# Patient Record
Sex: Female | Born: 1945
Health system: Southern US, Community
[De-identification: ages and names within clinical notes are randomized; demographics above are authoritative.]

## PROBLEM LIST (undated history)

## (undated) DIAGNOSIS — K5790 Diverticulosis of intestine, part unspecified, without perforation or abscess without bleeding: Secondary | ICD-10-CM

## (undated) DIAGNOSIS — M5030 Other cervical disc degeneration, unspecified cervical region: Secondary | ICD-10-CM

## (undated) DIAGNOSIS — N951 Menopausal and female climacteric states: Secondary | ICD-10-CM

## (undated) DIAGNOSIS — I10 Essential (primary) hypertension: Secondary | ICD-10-CM

## (undated) DIAGNOSIS — E669 Obesity, unspecified: Secondary | ICD-10-CM

## (undated) DIAGNOSIS — G4733 Obstructive sleep apnea (adult) (pediatric): Secondary | ICD-10-CM

## (undated) DIAGNOSIS — L304 Erythema intertrigo: Secondary | ICD-10-CM

## (undated) DIAGNOSIS — E119 Type 2 diabetes mellitus without complications: Secondary | ICD-10-CM

## (undated) DIAGNOSIS — N189 Chronic kidney disease, unspecified: Secondary | ICD-10-CM

## (undated) DIAGNOSIS — M47812 Spondylosis without myelopathy or radiculopathy, cervical region: Secondary | ICD-10-CM

## (undated) DIAGNOSIS — I8390 Asymptomatic varicose veins of unspecified lower extremity: Secondary | ICD-10-CM

## (undated) DIAGNOSIS — E785 Hyperlipidemia, unspecified: Secondary | ICD-10-CM

## (undated) DIAGNOSIS — R55 Syncope and collapse: Secondary | ICD-10-CM

## (undated) DIAGNOSIS — R252 Cramp and spasm: Secondary | ICD-10-CM

## (undated) HISTORY — DX: Hyperlipidemia, unspecified: E78.5

## (undated) HISTORY — DX: Chronic kidney disease, unspecified: N18.9

## (undated) HISTORY — DX: Asymptomatic varicose veins of unspecified lower extremity: I83.90

## (undated) HISTORY — DX: Essential (primary) hypertension: I10

## (undated) HISTORY — DX: Spondylosis without myelopathy or radiculopathy, cervical region: M47.812

## (undated) HISTORY — PX: JOINT REPLACEMENT: SHX530

## (undated) HISTORY — DX: Menopausal and female climacteric states: N95.1

## (undated) HISTORY — DX: Obesity, unspecified: E66.9

## (undated) HISTORY — DX: Type 2 diabetes mellitus without complications: E11.9

## (undated) HISTORY — DX: Other cervical disc degeneration, unspecified cervical region: M50.30

## (undated) HISTORY — DX: Cramp and spasm: R25.2

## (undated) HISTORY — DX: Obstructive sleep apnea (adult) (pediatric): G47.33

## (undated) HISTORY — DX: Erythema intertrigo: L30.4

---

## 1991-09-16 HISTORY — PX: ABDOMINAL HYSTERECTOMY: SHX81

## 1991-09-16 HISTORY — PX: BUNIONECTOMY: SHX129

## 2005-06-03 ENCOUNTER — Ambulatory Visit: Payer: Self-pay

## 2006-03-19 ENCOUNTER — Ambulatory Visit: Payer: Self-pay | Admitting: Family Medicine

## 2006-04-16 ENCOUNTER — Ambulatory Visit: Payer: Self-pay | Admitting: Family Medicine

## 2006-06-09 ENCOUNTER — Ambulatory Visit: Payer: Self-pay | Admitting: Family Medicine

## 2006-12-26 ENCOUNTER — Emergency Department: Payer: Self-pay | Admitting: Internal Medicine

## 2007-06-11 ENCOUNTER — Ambulatory Visit: Payer: Self-pay | Admitting: Family Medicine

## 2007-07-02 ENCOUNTER — Emergency Department: Payer: Self-pay | Admitting: Emergency Medicine

## 2007-07-06 ENCOUNTER — Emergency Department: Payer: Self-pay | Admitting: Emergency Medicine

## 2007-11-10 ENCOUNTER — Ambulatory Visit: Payer: Self-pay | Admitting: Family Medicine

## 2008-04-21 ENCOUNTER — Ambulatory Visit: Payer: Self-pay | Admitting: Family Medicine

## 2008-04-26 ENCOUNTER — Ambulatory Visit: Payer: Self-pay | Admitting: Family Medicine

## 2008-06-29 ENCOUNTER — Encounter: Admission: RE | Admit: 2008-06-29 | Discharge: 2008-06-29 | Payer: Self-pay | Admitting: Neurosurgery

## 2008-07-28 ENCOUNTER — Ambulatory Visit (HOSPITAL_COMMUNITY): Admission: RE | Admit: 2008-07-28 | Discharge: 2008-07-28 | Payer: Self-pay | Admitting: Neurosurgery

## 2008-08-31 ENCOUNTER — Ambulatory Visit: Payer: Self-pay | Admitting: Family Medicine

## 2008-09-15 LAB — HM DEXA SCAN: HM DEXA SCAN: NORMAL

## 2009-04-13 ENCOUNTER — Emergency Department: Payer: Self-pay | Admitting: Emergency Medicine

## 2009-10-04 ENCOUNTER — Ambulatory Visit: Payer: Self-pay | Admitting: Family Medicine

## 2009-11-17 ENCOUNTER — Emergency Department: Payer: Self-pay | Admitting: Emergency Medicine

## 2010-03-15 HISTORY — PX: REPLACEMENT TOTAL KNEE: SUR1224

## 2010-03-25 ENCOUNTER — Ambulatory Visit: Payer: Self-pay | Admitting: Orthopedic Surgery

## 2010-03-28 ENCOUNTER — Ambulatory Visit: Payer: Self-pay | Admitting: Family Medicine

## 2010-04-02 ENCOUNTER — Inpatient Hospital Stay: Payer: Self-pay | Admitting: Orthopedic Surgery

## 2010-10-08 ENCOUNTER — Ambulatory Visit: Payer: Self-pay | Admitting: Family Medicine

## 2011-02-28 LAB — HM PAP SMEAR: HM Pap smear: NORMAL

## 2011-05-23 ENCOUNTER — Ambulatory Visit: Payer: Self-pay | Admitting: Gastroenterology

## 2011-05-23 LAB — HM COLONOSCOPY

## 2011-08-12 ENCOUNTER — Other Ambulatory Visit: Payer: Self-pay | Admitting: Nurse Practitioner

## 2011-12-30 ENCOUNTER — Ambulatory Visit: Payer: Self-pay | Admitting: Family Medicine

## 2012-05-20 ENCOUNTER — Emergency Department: Payer: Self-pay | Admitting: Emergency Medicine

## 2012-05-22 ENCOUNTER — Emergency Department: Payer: Self-pay | Admitting: Emergency Medicine

## 2012-05-22 LAB — CBC
HCT: 40 % (ref 35.0–47.0)
HGB: 13.4 g/dL (ref 12.0–16.0)
MCV: 96 fL (ref 80–100)
Platelet: 187 10*3/uL (ref 150–440)
RBC: 4.16 10*6/uL (ref 3.80–5.20)
WBC: 8.6 10*3/uL (ref 3.6–11.0)

## 2012-05-22 LAB — COMPREHENSIVE METABOLIC PANEL
Albumin: 3.4 g/dL (ref 3.4–5.0)
Alkaline Phosphatase: 115 U/L (ref 50–136)
Bilirubin,Total: 0.3 mg/dL (ref 0.2–1.0)
Co2: 32 mmol/L (ref 21–32)
Creatinine: 1.63 mg/dL — ABNORMAL HIGH (ref 0.60–1.30)
Glucose: 87 mg/dL (ref 65–99)
Osmolality: 280 (ref 275–301)
Potassium: 3.3 mmol/L — ABNORMAL LOW (ref 3.5–5.1)
Sodium: 139 mmol/L (ref 136–145)

## 2012-06-18 ENCOUNTER — Ambulatory Visit: Payer: Self-pay | Admitting: Internal Medicine

## 2012-06-30 ENCOUNTER — Ambulatory Visit: Payer: Self-pay | Admitting: Internal Medicine

## 2012-07-29 HISTORY — PX: LASER ABLATION: SHX1947

## 2012-08-16 ENCOUNTER — Ambulatory Visit: Payer: Self-pay | Admitting: Family Medicine

## 2012-09-15 ENCOUNTER — Ambulatory Visit: Payer: Self-pay | Admitting: Family Medicine

## 2012-10-16 ENCOUNTER — Ambulatory Visit: Payer: Self-pay | Admitting: Family Medicine

## 2012-10-18 ENCOUNTER — Emergency Department: Payer: Self-pay | Admitting: Emergency Medicine

## 2012-10-18 LAB — BASIC METABOLIC PANEL
Anion Gap: 7 (ref 7–16)
Chloride: 110 mmol/L — ABNORMAL HIGH (ref 98–107)
Co2: 24 mmol/L (ref 21–32)
Creatinine: 1.71 mg/dL — ABNORMAL HIGH (ref 0.60–1.30)
Osmolality: 289 (ref 275–301)
Sodium: 141 mmol/L (ref 136–145)

## 2012-10-18 LAB — CBC
HCT: 32.1 % — ABNORMAL LOW (ref 35.0–47.0)
Platelet: 174 10*3/uL (ref 150–440)
RBC: 3.35 10*6/uL — ABNORMAL LOW (ref 3.80–5.20)

## 2012-10-18 LAB — PROTIME-INR
INR: 1
Prothrombin Time: 13.8 secs (ref 11.5–14.7)

## 2012-11-13 ENCOUNTER — Ambulatory Visit: Payer: Self-pay | Admitting: Family Medicine

## 2013-01-11 ENCOUNTER — Ambulatory Visit: Payer: Self-pay | Admitting: Family Medicine

## 2013-06-17 ENCOUNTER — Ambulatory Visit: Payer: Self-pay | Admitting: Orthopedic Surgery

## 2013-09-02 ENCOUNTER — Ambulatory Visit: Payer: Self-pay | Admitting: Physical Medicine and Rehabilitation

## 2013-11-07 DIAGNOSIS — N3281 Overactive bladder: Secondary | ICD-10-CM | POA: Insufficient documentation

## 2013-11-14 DIAGNOSIS — Z9889 Other specified postprocedural states: Secondary | ICD-10-CM | POA: Insufficient documentation

## 2013-11-14 HISTORY — PX: LAMINECTOMY: SHX219

## 2014-02-14 ENCOUNTER — Ambulatory Visit: Payer: Self-pay | Admitting: Family Medicine

## 2014-02-14 LAB — HM MAMMOGRAPHY: HM MAMMO: NORMAL

## 2014-05-16 LAB — HM DIABETES EYE EXAM

## 2014-09-20 DIAGNOSIS — J40 Bronchitis, not specified as acute or chronic: Secondary | ICD-10-CM | POA: Diagnosis not present

## 2014-11-06 DIAGNOSIS — M1711 Unilateral primary osteoarthritis, right knee: Secondary | ICD-10-CM | POA: Diagnosis not present

## 2014-11-06 DIAGNOSIS — M25512 Pain in left shoulder: Secondary | ICD-10-CM | POA: Diagnosis not present

## 2014-11-06 DIAGNOSIS — M12812 Other specific arthropathies, not elsewhere classified, left shoulder: Secondary | ICD-10-CM | POA: Diagnosis not present

## 2014-11-14 DIAGNOSIS — Z981 Arthrodesis status: Secondary | ICD-10-CM | POA: Diagnosis not present

## 2014-11-14 DIAGNOSIS — M4802 Spinal stenosis, cervical region: Secondary | ICD-10-CM | POA: Diagnosis not present

## 2014-11-29 DIAGNOSIS — L600 Ingrowing nail: Secondary | ICD-10-CM | POA: Diagnosis not present

## 2014-11-29 DIAGNOSIS — L02619 Cutaneous abscess of unspecified foot: Secondary | ICD-10-CM | POA: Diagnosis not present

## 2014-11-29 DIAGNOSIS — M79674 Pain in right toe(s): Secondary | ICD-10-CM | POA: Diagnosis not present

## 2014-11-29 DIAGNOSIS — L03039 Cellulitis of unspecified toe: Secondary | ICD-10-CM | POA: Diagnosis not present

## 2014-12-13 DIAGNOSIS — M79675 Pain in left toe(s): Secondary | ICD-10-CM | POA: Diagnosis not present

## 2014-12-13 DIAGNOSIS — M79674 Pain in right toe(s): Secondary | ICD-10-CM | POA: Diagnosis not present

## 2014-12-13 DIAGNOSIS — M898X9 Other specified disorders of bone, unspecified site: Secondary | ICD-10-CM | POA: Diagnosis not present

## 2014-12-13 DIAGNOSIS — M2012 Hallux valgus (acquired), left foot: Secondary | ICD-10-CM | POA: Diagnosis not present

## 2014-12-18 DIAGNOSIS — I10 Essential (primary) hypertension: Secondary | ICD-10-CM | POA: Diagnosis not present

## 2014-12-18 DIAGNOSIS — E784 Other hyperlipidemia: Secondary | ICD-10-CM | POA: Diagnosis not present

## 2014-12-18 DIAGNOSIS — E669 Obesity, unspecified: Secondary | ICD-10-CM | POA: Diagnosis not present

## 2014-12-18 DIAGNOSIS — I2510 Atherosclerotic heart disease of native coronary artery without angina pectoris: Secondary | ICD-10-CM | POA: Diagnosis not present

## 2015-01-08 DIAGNOSIS — M79675 Pain in left toe(s): Secondary | ICD-10-CM | POA: Diagnosis not present

## 2015-01-08 DIAGNOSIS — M898X9 Other specified disorders of bone, unspecified site: Secondary | ICD-10-CM | POA: Diagnosis not present

## 2015-01-08 DIAGNOSIS — M79674 Pain in right toe(s): Secondary | ICD-10-CM | POA: Diagnosis not present

## 2015-01-25 DIAGNOSIS — Z23 Encounter for immunization: Secondary | ICD-10-CM | POA: Diagnosis not present

## 2015-01-25 DIAGNOSIS — E785 Hyperlipidemia, unspecified: Secondary | ICD-10-CM | POA: Diagnosis not present

## 2015-01-25 DIAGNOSIS — I10 Essential (primary) hypertension: Secondary | ICD-10-CM | POA: Diagnosis not present

## 2015-01-25 DIAGNOSIS — E1121 Type 2 diabetes mellitus with diabetic nephropathy: Secondary | ICD-10-CM | POA: Diagnosis not present

## 2015-01-25 DIAGNOSIS — N183 Chronic kidney disease, stage 3 (moderate): Secondary | ICD-10-CM | POA: Diagnosis not present

## 2015-01-25 LAB — LIPID PANEL
Cholesterol: 139 mg/dL (ref 0–200)
HDL: 70 mg/dL (ref 35–70)
LDL Cholesterol: 58 mg/dL
Triglycerides: 57 mg/dL (ref 40–160)

## 2015-01-25 LAB — HEMOGLOBIN A1C: Hgb A1c MFr Bld: 5.8 % (ref 4.0–6.0)

## 2015-02-26 DIAGNOSIS — M79675 Pain in left toe(s): Secondary | ICD-10-CM | POA: Diagnosis not present

## 2015-02-26 DIAGNOSIS — M898X9 Other specified disorders of bone, unspecified site: Secondary | ICD-10-CM | POA: Diagnosis not present

## 2015-02-26 DIAGNOSIS — M2011 Hallux valgus (acquired), right foot: Secondary | ICD-10-CM | POA: Diagnosis not present

## 2015-02-26 DIAGNOSIS — M2012 Hallux valgus (acquired), left foot: Secondary | ICD-10-CM | POA: Diagnosis not present

## 2015-03-08 ENCOUNTER — Other Ambulatory Visit: Payer: Self-pay | Admitting: Family Medicine

## 2015-03-27 DIAGNOSIS — L811 Chloasma: Secondary | ICD-10-CM | POA: Diagnosis not present

## 2015-04-02 DIAGNOSIS — M2012 Hallux valgus (acquired), left foot: Secondary | ICD-10-CM | POA: Diagnosis not present

## 2015-04-02 DIAGNOSIS — M79675 Pain in left toe(s): Secondary | ICD-10-CM | POA: Diagnosis not present

## 2015-04-02 DIAGNOSIS — M2011 Hallux valgus (acquired), right foot: Secondary | ICD-10-CM | POA: Diagnosis not present

## 2015-04-02 DIAGNOSIS — M898X9 Other specified disorders of bone, unspecified site: Secondary | ICD-10-CM | POA: Diagnosis not present

## 2015-04-09 DIAGNOSIS — M12812 Other specific arthropathies, not elsewhere classified, left shoulder: Secondary | ICD-10-CM | POA: Diagnosis not present

## 2015-04-09 DIAGNOSIS — M162 Bilateral osteoarthritis resulting from hip dysplasia: Secondary | ICD-10-CM | POA: Diagnosis not present

## 2015-04-09 DIAGNOSIS — M1711 Unilateral primary osteoarthritis, right knee: Secondary | ICD-10-CM | POA: Diagnosis not present

## 2015-04-09 DIAGNOSIS — M25552 Pain in left hip: Secondary | ICD-10-CM | POA: Diagnosis not present

## 2015-04-09 DIAGNOSIS — M25551 Pain in right hip: Secondary | ICD-10-CM | POA: Diagnosis not present

## 2015-04-19 DIAGNOSIS — M25461 Effusion, right knee: Secondary | ICD-10-CM | POA: Diagnosis not present

## 2015-04-19 DIAGNOSIS — G8929 Other chronic pain: Secondary | ICD-10-CM | POA: Diagnosis not present

## 2015-04-19 DIAGNOSIS — M25561 Pain in right knee: Secondary | ICD-10-CM | POA: Diagnosis not present

## 2015-04-21 ENCOUNTER — Encounter: Payer: Self-pay | Admitting: Family Medicine

## 2015-04-21 DIAGNOSIS — G4700 Insomnia, unspecified: Secondary | ICD-10-CM | POA: Insufficient documentation

## 2015-04-21 DIAGNOSIS — M4802 Spinal stenosis, cervical region: Secondary | ICD-10-CM | POA: Insufficient documentation

## 2015-04-21 DIAGNOSIS — R600 Localized edema: Secondary | ICD-10-CM | POA: Insufficient documentation

## 2015-04-21 DIAGNOSIS — G4733 Obstructive sleep apnea (adult) (pediatric): Secondary | ICD-10-CM | POA: Insufficient documentation

## 2015-04-21 DIAGNOSIS — I10 Essential (primary) hypertension: Secondary | ICD-10-CM | POA: Insufficient documentation

## 2015-04-21 DIAGNOSIS — I8390 Asymptomatic varicose veins of unspecified lower extremity: Secondary | ICD-10-CM | POA: Insufficient documentation

## 2015-04-21 DIAGNOSIS — N183 Chronic kidney disease, stage 3 unspecified: Secondary | ICD-10-CM | POA: Insufficient documentation

## 2015-04-21 DIAGNOSIS — IMO0002 Reserved for concepts with insufficient information to code with codable children: Secondary | ICD-10-CM | POA: Insufficient documentation

## 2015-04-21 DIAGNOSIS — I872 Venous insufficiency (chronic) (peripheral): Secondary | ICD-10-CM | POA: Insufficient documentation

## 2015-04-21 DIAGNOSIS — M5030 Other cervical disc degeneration, unspecified cervical region: Secondary | ICD-10-CM | POA: Insufficient documentation

## 2015-04-21 DIAGNOSIS — L304 Erythema intertrigo: Secondary | ICD-10-CM | POA: Insufficient documentation

## 2015-04-21 DIAGNOSIS — M159 Polyosteoarthritis, unspecified: Secondary | ICD-10-CM | POA: Insufficient documentation

## 2015-04-21 DIAGNOSIS — N959 Unspecified menopausal and perimenopausal disorder: Secondary | ICD-10-CM | POA: Insufficient documentation

## 2015-04-21 DIAGNOSIS — I491 Atrial premature depolarization: Secondary | ICD-10-CM | POA: Insufficient documentation

## 2015-04-21 DIAGNOSIS — R32 Unspecified urinary incontinence: Secondary | ICD-10-CM | POA: Insufficient documentation

## 2015-04-21 DIAGNOSIS — E785 Hyperlipidemia, unspecified: Secondary | ICD-10-CM | POA: Insufficient documentation

## 2015-04-21 DIAGNOSIS — E1129 Type 2 diabetes mellitus with other diabetic kidney complication: Secondary | ICD-10-CM | POA: Insufficient documentation

## 2015-04-26 ENCOUNTER — Encounter (INDEPENDENT_AMBULATORY_CARE_PROVIDER_SITE_OTHER): Payer: Self-pay

## 2015-04-26 ENCOUNTER — Encounter: Payer: Self-pay | Admitting: Family Medicine

## 2015-04-26 ENCOUNTER — Ambulatory Visit (INDEPENDENT_AMBULATORY_CARE_PROVIDER_SITE_OTHER): Payer: BLUE CROSS/BLUE SHIELD | Admitting: Family Medicine

## 2015-04-26 VITALS — BP 126/74 | HR 82 | Temp 98.0°F | Resp 18 | Ht 69.0 in | Wt 237.9 lb

## 2015-04-26 DIAGNOSIS — Z23 Encounter for immunization: Secondary | ICD-10-CM

## 2015-04-26 DIAGNOSIS — Z0000 Encounter for general adult medical examination without abnormal findings: Secondary | ICD-10-CM

## 2015-04-26 DIAGNOSIS — M170 Bilateral primary osteoarthritis of knee: Secondary | ICD-10-CM | POA: Diagnosis not present

## 2015-04-26 NOTE — Patient Instructions (Signed)
  [name] , Thank you for taking time to come for your Medicare Wellness Visit. I appreciate your ongoing commitment to your health goals. Please review the following plan we discussed and let me know if I can assist you in the future.   These are the goals we discussed: Send me a copy of last eye exam Discussed importance of 150 minutes of physical activity weekly, eat two servings of fish weekly, eat one serving of tree nuts ( cashews, pistachios, pecans, almonds.Marland Kitchen) every other day, eat 6 servings of fruit/vegetables daily and drink plenty of water and avoid sweet beverages.    This is a list of the screening recommended for you and due dates:  Health Maintenance  Topic Date Due  . Flu Shot  04/16/2015  . Eye exam for diabetics  05/17/2015  . Hemoglobin A1C  07/28/2015  . Urine Protein Check  01/25/2016  . Mammogram  02/15/2016  . Complete foot exam   04/25/2016  . Tetanus Vaccine  06/03/2016  . Colon Cancer Screening  05/22/2021  . DEXA scan (bone density measurement)  Completed  . Shingles Vaccine  Completed  .  Hepatitis C: One time screening is recommended by Center for Disease Control  (CDC) for  adults born from [birth year] through 1965.   Completed  . Pneumonia vaccines  Completed

## 2015-04-26 NOTE — Progress Notes (Signed)
Name: Becky Trevino   MRN: [id number]    DOB: 1946/08/01   Date:04/26/2015       Progress Note  Subjective  Chief Complaint  Chief Complaint  Patient presents with  . Annual Exam    HPI  Medicare Wellness:   Functional ability/safety issues: No Issues Hearing issues: Addressed  Activities of daily living: Discussed Home safety issues: No Issues  End Of Life Planning: Offered verbal information regarding advanced directives, healthcare power of attorney.  Preventative care, Health maintenance, Preventative health measures discussed.  Preventative screenings discussed today: lab work, colonoscopy, PAP, mammogram, DEXA.  Low Dose CT Chest recommended if Age 96-80 years, 30 pack-year currently smoking OR have quit w/in 15years. Never smoked  Lifestyle risk factor issued reviewed: Diet, exercise, weight management, advised patient smoking is not healthy, nutrition/diet.  Preventative health measures discussed (5-10 year plan).  Reviewed and recommended vaccinations:  - Pneumovax  - Prevnar  - Annual Influenza - Zostavax - Tdap   Depression screening: Done Fall risk screening: Done Discuss ADLs/IADLs: Done  Current medical providers: See HPI  Other health risk factors identified this visit: No other issues Cognitive impairment issues: None identified  All above discussed with patient. Appropriate education, counseling and referral will be made based upon the above.   OA knee: she has a long history of OA, had left knee replacement in 2011, and is due for right knee replacement. She states last week was stretching her right leg to put her pants on and heard a grinding sound. Since then she has noticed increase in pain and also left lower foot swelling has been worse. She went to Urgent Care, had an x-ray and was told it was a OA flare. Advised her to follow up with Ortho and schedule knee replacement if no improvement within the next week  Patient Active Problem List    Diagnosis Date Noted  . Benign essential HTN 04/21/2015  . Edema leg 04/21/2015  . Chronic kidney disease (CKD), stage III (moderate) 04/21/2015  . Chronic venous insufficiency 04/21/2015  . DDD (degenerative disc disease), cervical 04/21/2015  . Diabetes mellitus with renal manifestation 04/21/2015  . Dyslipidemia 04/21/2015  . Bladder cystocele 04/21/2015  . Urinary incontinence 04/21/2015  . Leg varices 04/21/2015  . Insomnia 04/21/2015  . Eczema intertrigo 04/21/2015  . Adult BMI 30+ 04/21/2015  . Obstructive apnea 04/21/2015  . Menopausal and perimenopausal disorder 04/21/2015  . Supraventricular premature beats 04/21/2015  . Arthritis of knee, degenerative 04/21/2015  . Cervical spinal stenosis 04/21/2015  . H/O Spinal surgery 11/14/2013    Past Surgical History  Procedure Laterality Date  . Replacement total knee Left 03/2010    Dr. Rudene Christians  . Bunionectomy Bilateral 1993  . Abdominal hysterectomy  1993    Total  . Laminectomy  11/14/2013    [facility], Dr. Delilah Shan  . Laser ablation Bilateral 07/29/2012    Dr. Lucky Cowboy    Family History  Problem Relation Age of Onset  . Diabetes Mother   . Hyperlipidemia Mother   . Hypertension Mother   . Diabetes Father   . Hyperlipidemia Father   . Hypertension Father   . Obesity Father   . Hypertension Sister   . Hyperlipidemia Sister   . Hyperlipidemia Sister     Social History   Social History  . Marital Status: Married    Spouse Name: N/A  . Number of Children: N/A  . Years of Education: N/A   Occupational History  . Not on file.  Social History Main Topics  . Smoking status: Never Smoker   . Smokeless tobacco: Never Used  . Alcohol Use: No  . Drug Use: No  . Sexual Activity: Yes   Other Topics Concern  . Not on file   Social History Narrative     Current outpatient prescriptions:  .  acetaminophen (TYLENOL) 500 MG tablet, Take 1 tablet by mouth as needed., Disp: , Rfl:  .  aspirin 81 MG chewable tablet, Chew 1  tablet by mouth daily., Disp: , Rfl:  .  atorvastatin (LIPITOR) 40 MG tablet, Take 1 tablet by mouth every evening., Disp: , Rfl:  .  blood glucose meter kit and supplies KIT, , Disp: , Rfl:  .  Blood Glucose Monitoring Suppl (FIFTY50 GLUCOSE METER 2.0) W/DEVICE KIT, Use as directed., Disp: , Rfl:  .  Calcium Carbonate-Vitamin D (CALCIUM 600-D) 600-400 MG-UNIT per tablet, Take 1 tablet by mouth 2 (two) times daily., Disp: , Rfl:  .  celecoxib (CELEBREX) 200 MG capsule, Take 200 mg by mouth as needed., Disp: , Rfl:  .  Elastic Bandages & Supports (MEDICAL COMPRESSION STOCKINGS) MISC, , Disp: , Rfl:  .  gabapentin (NEURONTIN) 300 MG capsule, Take 1 capsule by mouth every evening., Disp: , Rfl:  .  HYDROcodone-acetaminophen (NORCO/VICODIN) 5-325 MG per tablet, Take 1 tablet by mouth 2 (two) times daily., Disp: , Rfl:  .  imipramine (TOFRANIL) 25 MG tablet, Take 1 tablet by mouth daily., Disp: , Rfl:  .  ketoconazole (NIZORAL) 2 % cream, Apply 1 application topically 2 (two) times daily., Disp: , Rfl:  .  Lancet Devices ([employer] LANCING DEVICE) MISC, 1 each., Disp: , Rfl:  .  latanoprost (XALATAN) 0.005 % ophthalmic solution, Apply 1 drop to eye Nightly., Disp: , Rfl:  .  Magnesium Oxide 500 MG CAPS, Take 1 capsule by mouth daily., Disp: , Rfl:  .  metoprolol succinate (TOPROL-XL) 100 MG 24 hr tablet, TAKE 1 TABLET EVERY DAY, Disp: 30 tablet, Rfl: 0 .  zolpidem (AMBIEN) 5 MG tablet, Take 1 tablet by mouth every evening., Disp: , Rfl:   Allergies  Allergen Reactions  . Lisinopril Swelling     ROS  Constitutional: Negative for fever or significant weight change.  Respiratory: Negative for cough and shortness of breath.   Cardiovascular: Negative for chest pain or palpitations.  Gastrointestinal: Negative for abdominal pain, no bowel changes.  Musculoskeletal: positive  for gait problem or right knee swelling from OA - needs surgery Skin: Negative for rash.  Neurological: Negative for dizziness  or headache.  No other specific complaints in a complete review of systems (except as listed in HPI above).  Objective  Filed Vitals:   04/26/15 0828  BP: 126/74  Pulse: 82  Temp: 98 F (36.7 C)  TempSrc: Oral  Resp: 18  Height: 5' 9"  (1.753 m)  Weight: 237 lb 14.4 oz (107.911 kg)  SpO2: 96%    Body mass index is 35.12 kg/(m^2).  Physical Exam  Constitutional: Patient appears well-developed and obese. No distress.  HENT: Head: Normocephalic and atraumatic. Ears: B TMs ok, no erythema or effusion; Nose: Nose normal. Mouth/Throat: Oropharynx is clear and moist. No oropharyngeal exudate.  Eyes: Conjunctivae and EOM are normal. Pupils are equal, round, and reactive to light. No scleral icterus.  Neck: Decrease  range of motion. Neck supple. No JVD present. No thyromegaly present.  Cardiovascular: Normal rate, regular rhythm and normal heart sounds.  No murmur heard. BLE edema worse on right foot -  varicose veins both legs Pulmonary/Chest: Effort normal and breath sounds normal. No respiratory distress. Abdominal: Soft. Bowel sounds are normal, no distension. There is no tenderness. no masses Breast: no lumps or masses, no nipple discharge or rashes FEMALE GENITALIA:  Not done RECTAL: not done Musculoskeletal: crepitus with extension of right knee, s/p left knee replacement, right knee joint effusions. No gross deformities Neurological: he is alert and oriented to person, place, and time. No cranial nerve deficit. Coordination, balance, strength, speech and gait are normal.  Skin: Skin is warm and dry. No rash noted. No erythema.  Psychiatric: Patient has a normal mood and affect. behavior is normal. Judgment and thought content normal.  Diabetic Foot Exam - Simple   Simple Foot Form  Visual Inspection  See comments:  Yes  Sensation Testing  Intact to touch and monofilament testing bilaterally:  Yes  Pulse Check  Posterior Tibialis and Dorsalis pulse intact bilaterally:  Yes   Comments  Sees Dr. Elvina Mattes, she has bunion formation, some bandaids from recent procedures      PHQ2/9: Depression screen [facility] 2/9 04/26/2015  Decreased Interest 0  Down, Depressed, Hopeless 0  PHQ - 2 Score 0    Fall Risk: Fall Risk  04/26/2015  Falls in the past year? Yes  Number falls in past yr: 1  Injury with Fall? No    Assessment & Plan  1. Medicare annual wellness visit, subsequent  Discussed importance of 150 minutes of physical activity weekly, eat two servings of fish weekly, eat one serving of tree nuts ( cashews, pistachios, pecans, almonds.Marland Kitchen) every other day, eat 6 servings of fruit/vegetables daily and drink plenty of water and avoid sweet beverages.   Safety: no lose rugs at home, use night light, discussed raised toillett seat, and power of attorney of health care.  2. Primary osteoarthritis of both knees Dr. Kyla Balzarine is her Ortho, she will follow up with him if symptoms do not improve for a right knee replacement

## 2015-04-28 ENCOUNTER — Other Ambulatory Visit: Payer: Self-pay | Admitting: Family Medicine

## 2015-05-06 ENCOUNTER — Other Ambulatory Visit: Payer: Self-pay | Admitting: Family Medicine

## 2015-05-07 ENCOUNTER — Other Ambulatory Visit: Payer: Self-pay | Admitting: Family Medicine

## 2015-05-07 NOTE — Telephone Encounter (Signed)
Patient requesting refill. 

## 2015-05-28 ENCOUNTER — Encounter: Payer: Self-pay | Admitting: Family Medicine

## 2015-05-28 ENCOUNTER — Ambulatory Visit (INDEPENDENT_AMBULATORY_CARE_PROVIDER_SITE_OTHER): Payer: BLUE CROSS/BLUE SHIELD | Admitting: Family Medicine

## 2015-05-28 VITALS — BP 136/78 | HR 76 | Temp 97.9°F | Resp 16 | Ht 69.0 in | Wt 238.7 lb

## 2015-05-28 DIAGNOSIS — G4700 Insomnia, unspecified: Secondary | ICD-10-CM

## 2015-05-28 DIAGNOSIS — M1711 Unilateral primary osteoarthritis, right knee: Secondary | ICD-10-CM

## 2015-05-28 DIAGNOSIS — E785 Hyperlipidemia, unspecified: Secondary | ICD-10-CM

## 2015-05-28 DIAGNOSIS — E1122 Type 2 diabetes mellitus with diabetic chronic kidney disease: Secondary | ICD-10-CM

## 2015-05-28 DIAGNOSIS — I10 Essential (primary) hypertension: Secondary | ICD-10-CM | POA: Diagnosis not present

## 2015-05-28 DIAGNOSIS — N189 Chronic kidney disease, unspecified: Secondary | ICD-10-CM | POA: Diagnosis not present

## 2015-05-28 DIAGNOSIS — M5030 Other cervical disc degeneration, unspecified cervical region: Secondary | ICD-10-CM | POA: Diagnosis not present

## 2015-05-28 LAB — POCT GLYCOSYLATED HEMOGLOBIN (HGB A1C): Hemoglobin A1C: 6

## 2015-05-28 MED ORDER — HYDROCODONE-ACETAMINOPHEN 5-325 MG PO TABS: 1.0000 | ORAL_TABLET | Freq: Two times a day (BID) | ORAL | Status: DC

## 2015-05-28 MED ORDER — GABAPENTIN 300 MG PO CAPS: 300.0000 mg | ORAL_CAPSULE | Freq: Every evening | ORAL | Status: DC

## 2015-05-28 MED ORDER — ATORVASTATIN CALCIUM 40 MG PO TABS: 40.0000 mg | ORAL_TABLET | Freq: Every evening | ORAL | Status: DC

## 2015-05-28 NOTE — Progress Notes (Signed)
Name: Becky Trevino   MRN: [id number]    DOB: [birth year]   Date:05/28/2015       Progress Note  Subjective  Chief Complaint  Chief Complaint  Patient presents with  . Medication Refill    1 month follow-up  . Hypertension  . Hyperlipidemia  . Insomnia  . Leg Pain    ongoing right leg pain, wants refill on pain meds    HPI  DMII with renal manifestation: not checking her fsbs at home, glucose has been well controlled with diet, last hgb A1C 5.8%, last urine micro was 20. Doing well  HTN: taking medication as prescribed and occasionally has some orthostatic changes. Advised to stand up slowly. No chest pain or palpitation  Hyperlipidemia: taking Atorvastatin daily and denies side effects.   Insomnia: taking Ambien prn only and is doing well  Cervical DDD: s/p neck fusion in March 2015, still has pain and takes Gabapentin qhs, needs refills.   Right knee Pain: chronic, she has OA, due for a replacement but wants to hold off for now. Pain is usually 7-8 range. Takes Hydrocodone prn only and makes it last three months.  Pain is described as aching. Worse with activity/standing, better with rest    Patient Active Problem List   Diagnosis Date Noted  . Benign essential HTN 04/21/2015  . Edema leg 04/21/2015  . Chronic kidney disease (CKD), stage III (moderate) 04/21/2015  . Chronic venous insufficiency 04/21/2015  . DDD (degenerative disc disease), cervical 04/21/2015  . Diabetes mellitus with renal manifestation 04/21/2015  . Dyslipidemia 04/21/2015  . Bladder cystocele 04/21/2015  . Urinary incontinence 04/21/2015  . Leg varices 04/21/2015  . Insomnia 04/21/2015  . Eczema intertrigo 04/21/2015  . Adult BMI 30+ 04/21/2015  . Obstructive apnea 04/21/2015  . Menopausal and perimenopausal disorder 04/21/2015  . Supraventricular premature beats 04/21/2015  . Arthritis of knee, degenerative 04/21/2015  . Cervical spinal stenosis 04/21/2015  . H/O Spinal surgery 11/14/2013     Past Surgical History  Procedure Laterality Date  . Replacement total knee Left 03/2010    Dr. Rudene Christians  . Bunionectomy Bilateral 1993  . Abdominal hysterectomy  1993    Total  . Laminectomy  11/14/2013    [facility], Dr. Delilah Shan  . Laser ablation Bilateral 07/29/2012    Dr. Lucky Cowboy    Family History  Problem Relation Age of Onset  . Diabetes Mother   . Hyperlipidemia Mother   . Hypertension Mother   . Diabetes Father   . Hyperlipidemia Father   . Hypertension Father   . Obesity Father   . Hypertension Sister   . Hyperlipidemia Sister   . Hyperlipidemia Sister     Social History   Social History  . Marital Status: Married    Spouse Name: N/A  . Number of Children: N/A  . Years of Education: N/A   Occupational History  . Not on file.   Social History Main Topics  . Smoking status: Never Smoker   . Smokeless tobacco: Never Used  . Alcohol Use: No  . Drug Use: No  . Sexual Activity: Yes   Other Topics Concern  . Not on file   Social History Narrative     Current outpatient prescriptions:  .  acetaminophen (TYLENOL) 500 MG tablet, Take 1 tablet by mouth as needed., Disp: , Rfl:  .  aspirin 81 MG chewable tablet, Chew 1 tablet by mouth daily., Disp: , Rfl:  .  atorvastatin (LIPITOR) 40 MG tablet, Take 1  tablet (40 mg total) by mouth every evening., Disp: 90 tablet, Rfl: 1 .  blood glucose meter kit and supplies KIT, , Disp: , Rfl:  .  Blood Glucose Monitoring Suppl (FIFTY50 GLUCOSE METER 2.0) W/DEVICE KIT, Use as directed., Disp: , Rfl:  .  Calcium Carbonate-Vitamin D (CALCIUM 600-D) 600-400 MG-UNIT per tablet, Take 1 tablet by mouth 2 (two) times daily., Disp: , Rfl:  .  celecoxib (CELEBREX) 200 MG capsule, Take 200 mg by mouth as needed., Disp: , Rfl:  .  Elastic Bandages & Supports (MEDICAL COMPRESSION STOCKINGS) MISC, , Disp: , Rfl:  .  gabapentin (NEURONTIN) 300 MG capsule, Take 1 capsule (300 mg total) by mouth every evening., Disp: 90 capsule, Rfl: 1 .   HYDROcodone-acetaminophen (NORCO/VICODIN) 5-325 MG per tablet, Take 1 tablet by mouth 2 (two) times daily., Disp: 60 tablet, Rfl: 0 .  imipramine (TOFRANIL) 25 MG tablet, Take 1 tablet by mouth daily., Disp: , Rfl:  .  ketoconazole (NIZORAL) 2 % cream, Apply 1 application topically 2 (two) times daily., Disp: , Rfl:  .  Lancet Devices ([employer] LANCING DEVICE) MISC, 1 each., Disp: , Rfl:  .  latanoprost (XALATAN) 0.005 % ophthalmic solution, Apply 1 drop to eye Nightly., Disp: , Rfl:  .  Magnesium Oxide 500 MG CAPS, Take 1 capsule by mouth daily., Disp: , Rfl:  .  metoprolol succinate (TOPROL-XL) 100 MG 24 hr tablet, TAKE 1 TABLET BY MOUTH EVERY DAY, Disp: 90 tablet, Rfl: 1 .  zolpidem (AMBIEN) 5 MG tablet, Take 1 tablet by mouth every evening., Disp: , Rfl:   Allergies  Allergen Reactions  . Lisinopril Swelling     ROS  Constitutional: Negative for fever or weight change.  Respiratory: Negative for cough and shortness of breath.   Cardiovascular: Negative for chest pain or palpitations.  Gastrointestinal: Negative for abdominal pain, no bowel changes.  Musculoskeletal: Negative for gait problem or joint swelling.  Skin: Negative for rash.  Neurological: Negative for dizziness or headache.  No other specific complaints in a complete review of systems (except as listed in HPI above).  Objective  Filed Vitals:   05/28/15 1051  BP: 136/78  Pulse: 76  Temp: 97.9 F (36.6 C)  TempSrc: Oral  Resp: 16  Height: _0  (1.753 m)  Weight: 238 lb 11.2 oz (108.274 kg)  SpO2: 95%    Body mass index is 35.23 kg/(m^2).  Physical Exam  Constitutional: Patient appears well-developed and well-nourished. Obese No distress.  HEENT: head atraumatic, normocephalic, pupils equal and reactive to light,  neck supple, but seems stiff and decrease in rom, no pain during palpation , throat within normal limits Cardiovascular: Normal rate, regular rhythm and normal heart sounds.  No murmur heard. No BLE  edema. Pulmonary/Chest: Effort normal and breath sounds normal. No respiratory distress. Abdominal: Soft.  There is no tenderness. Psychiatric: Patient has a normal mood and affect. behavior is normal. Judgment and thought content normal. Muscular skeletal: grinding with extension of right knee  PHQ2/9: Depression screen PHQ 2/9 04/26/2015  Decreased Interest 0  Down, Depressed, Hopeless 0  PHQ - 2 Score 0     Fall Risk: Fall Risk  04/26/2015  Falls in the past year? Yes  Number falls in past yr: 1  Injury with Fall? No     Assessment & Plan  1. Type 2 diabetes mellitus with diabetic chronic kidney disease Continue life style modification, recheck renal panel next visit - POCT HgB A1C  2. DDD (degenerative  disc disease), cervical Stable on medication, continue Gabapentin qhs, prescription sent to her pharmacy today  3. Benign essential HTN At goal, get up slowly and stay hydrated to avoid orthostatic symptoms  4. Insomnia Continue prn medication  5. Dyslipidemia Continue statin therapy   6. Primary osteoarthritis of right knee Gave hydrodocodone #60 to last until next visit, take it prn

## 2015-06-06 ENCOUNTER — Other Ambulatory Visit: Payer: Self-pay | Admitting: Family Medicine

## 2015-06-06 NOTE — Telephone Encounter (Signed)
Patient requesting refill. 

## 2015-06-21 DIAGNOSIS — E784 Other hyperlipidemia: Secondary | ICD-10-CM | POA: Diagnosis not present

## 2015-06-21 DIAGNOSIS — I10 Essential (primary) hypertension: Secondary | ICD-10-CM | POA: Diagnosis not present

## 2015-06-21 DIAGNOSIS — I5032 Chronic diastolic (congestive) heart failure: Secondary | ICD-10-CM | POA: Diagnosis not present

## 2015-06-21 DIAGNOSIS — R0602 Shortness of breath: Secondary | ICD-10-CM | POA: Diagnosis not present

## 2015-06-21 DIAGNOSIS — R600 Localized edema: Secondary | ICD-10-CM | POA: Diagnosis not present

## 2015-07-19 DIAGNOSIS — N393 Stress incontinence (female) (male): Secondary | ICD-10-CM | POA: Diagnosis not present

## 2015-07-19 DIAGNOSIS — R339 Retention of urine, unspecified: Secondary | ICD-10-CM | POA: Diagnosis not present

## 2015-07-19 DIAGNOSIS — N3941 Urge incontinence: Secondary | ICD-10-CM | POA: Diagnosis not present

## 2015-07-19 DIAGNOSIS — N3281 Overactive bladder: Secondary | ICD-10-CM | POA: Diagnosis not present

## 2015-08-13 DIAGNOSIS — M1711 Unilateral primary osteoarthritis, right knee: Secondary | ICD-10-CM | POA: Diagnosis not present

## 2015-08-13 DIAGNOSIS — M12812 Other specific arthropathies, not elsewhere classified, left shoulder: Secondary | ICD-10-CM | POA: Diagnosis not present

## 2015-08-27 ENCOUNTER — Ambulatory Visit (INDEPENDENT_AMBULATORY_CARE_PROVIDER_SITE_OTHER): Payer: BLUE CROSS/BLUE SHIELD | Admitting: Family Medicine

## 2015-08-27 ENCOUNTER — Encounter: Payer: Self-pay | Admitting: Family Medicine

## 2015-08-27 VITALS — BP 128/68 | HR 81 | Temp 98.5°F | Resp 16 | Ht 69.0 in | Wt 238.6 lb

## 2015-08-27 DIAGNOSIS — E785 Hyperlipidemia, unspecified: Secondary | ICD-10-CM

## 2015-08-27 DIAGNOSIS — E1122 Type 2 diabetes mellitus with diabetic chronic kidney disease: Secondary | ICD-10-CM | POA: Diagnosis not present

## 2015-08-27 DIAGNOSIS — M159 Polyosteoarthritis, unspecified: Secondary | ICD-10-CM

## 2015-08-27 DIAGNOSIS — N183 Chronic kidney disease, stage 3 unspecified: Secondary | ICD-10-CM

## 2015-08-27 DIAGNOSIS — G4733 Obstructive sleep apnea (adult) (pediatric): Secondary | ICD-10-CM

## 2015-08-27 DIAGNOSIS — M150 Primary generalized (osteo)arthritis: Secondary | ICD-10-CM

## 2015-08-27 DIAGNOSIS — N182 Chronic kidney disease, stage 2 (mild): Secondary | ICD-10-CM | POA: Diagnosis not present

## 2015-08-27 DIAGNOSIS — I10 Essential (primary) hypertension: Secondary | ICD-10-CM

## 2015-08-27 LAB — POCT GLYCOSYLATED HEMOGLOBIN (HGB A1C): Hemoglobin A1C: 5.7

## 2015-08-27 MED ORDER — HYDROCODONE-ACETAMINOPHEN 5-325 MG PO TABS: 1.0000 | ORAL_TABLET | Freq: Two times a day (BID) | ORAL | Status: DC

## 2015-08-27 MED ORDER — GLUCOSE BLOOD VI STRP: ORAL_STRIP | Status: DC

## 2015-08-27 NOTE — Progress Notes (Signed)
Name: Becky Trevino   MRN: [id number]    DOB: 10-Feb-1946   Date:08/27/2015       Progress Note  Subjective  Chief Complaint  Chief Complaint  Patient presents with  . Medication Refill    follow-up 3 months  . Hypertension  . Hyperlipidemia  . Diabetes    checks glucose 1x per month  . Insomnia  . Pain    right knee and neck    HPI   DMII with renal manifestation: not checking her fsbs at home, glucose has been well controlled with diet, lhbgA1C is still at goal.  Last urine micro was 20. Doing well, no polyphagia, polyuria or polydipsia.   HTN: taking medication as prescribed no longer has orthostatic changes.  No chest pain, no SOB  or palpitation.   Hyperlipidemia: taking Atorvastatin daily and denies side effects. No chest pain  Insomnia: taking Ambien prn only and is doing well, no side effects such as nightmares, sleeping walking.   Cervical DDD: s/p neck fusion in March 2015, still has pain and takes Gabapentin and hydrocodone only when having severe pain  Right knee Pain: chronic, she has OA, due for a replacement and will have it soon. . Pain is usually 7-8 range. Takes Hydrocodone prn only and makes it last three months. She also has OA of left hip and will need a replacement also  Pain is described as aching. Worse with activity/standing, better with rest. Getting progressively worse  Patient Active Problem List   Diagnosis Date Noted  . Benign essential HTN 04/21/2015  . Edema leg 04/21/2015  . Chronic kidney disease (CKD), stage III (moderate) 04/21/2015  . Chronic venous insufficiency 04/21/2015  . DDD (degenerative disc disease), cervical 04/21/2015  . Diabetes mellitus with renal manifestation ([place]) 04/21/2015  . Dyslipidemia 04/21/2015  . Bladder cystocele 04/21/2015  . Urinary incontinence 04/21/2015  . Leg varices 04/21/2015  . Insomnia 04/21/2015  . Eczema intertrigo 04/21/2015  . Obesity, morbid ([place]) 04/21/2015  . Obstructive apnea 04/21/2015   . Menopausal and perimenopausal disorder 04/21/2015  . Supraventricular premature beats 04/21/2015  . Osteoarthritis of multiple joints 04/21/2015  . H/O Spinal surgery 11/14/2013  . Detrusor muscle hypertonia 11/07/2013    Past Surgical History  Procedure Laterality Date  . Replacement total knee Left 03/2010    Dr. Rudene Christians  . Bunionectomy Bilateral 1993  . Abdominal hysterectomy  1993    Total  . Laminectomy  11/14/2013    [facility], Dr. Delilah Shan  . Laser ablation Bilateral 07/29/2012    Dr. Lucky Cowboy    Family History  Problem Relation Age of Onset  . Diabetes Mother   . Hyperlipidemia Mother   . Hypertension Mother   . Diabetes Father   . Hyperlipidemia Father   . Hypertension Father   . Obesity Father   . Hypertension Sister   . Hyperlipidemia Sister   . Hyperlipidemia Sister     Social History   Social History  . Marital Status: Married    Spouse Name: N/A  . Number of Children: N/A  . Years of Education: N/A   Occupational History  . Not on file.   Social History Main Topics  . Smoking status: Never Smoker   . Smokeless tobacco: Never Used  . Alcohol Use: No  . Drug Use: No  . Sexual Activity: Yes   Other Topics Concern  . Not on file   Social History Narrative     Current outpatient prescriptions:  .  acetaminophen (  TYLENOL) 500 MG tablet, Take 1 tablet by mouth as needed., Disp: , Rfl:  .  aspirin 81 MG chewable tablet, Chew 1 tablet by mouth daily., Disp: , Rfl:  .  atorvastatin (LIPITOR) 40 MG tablet, Take 1 tablet (40 mg total) by mouth every evening., Disp: 90 tablet, Rfl: 1 .  blood glucose meter kit and supplies KIT, , Disp: , Rfl:  .  Blood Glucose Monitoring Suppl (FIFTY50 GLUCOSE METER 2.0) W/DEVICE KIT, Use as directed., Disp: , Rfl:  .  Calcium Carbonate-Vitamin D (CALCIUM 600-D) 600-400 MG-UNIT per tablet, Take 1 tablet by mouth 2 (two) times daily., Disp: , Rfl:  .  celecoxib (CELEBREX) 200 MG capsule, Take 200 mg by mouth as needed., Disp: ,  Rfl:  .  Elastic Bandages & Supports (MEDICAL COMPRESSION STOCKINGS) MISC, , Disp: , Rfl:  .  furosemide (LASIX) 20 MG tablet, Take 1 tablet by mouth daily., Disp: , Rfl: 6 .  gabapentin (NEURONTIN) 300 MG capsule, Take 1 capsule (300 mg total) by mouth every evening., Disp: 90 capsule, Rfl: 1 .  hydrochlorothiazide (HYDRODIURIL) 12.5 MG tablet, TAKE 1 TABLET BY MOUTH EVERY DAY, Disp: 30 tablet, Rfl: 3 .  HYDROcodone-acetaminophen (NORCO/VICODIN) 5-325 MG tablet, Take 1 tablet by mouth 2 (two) times daily., Disp: 60 tablet, Rfl: 0 .  imipramine (TOFRANIL) 25 MG tablet, Take 1 tablet by mouth daily., Disp: , Rfl:  .  ketoconazole (NIZORAL) 2 % cream, Apply 1 application topically 2 (two) times daily., Disp: , Rfl:  .  Lancet Devices ([employer] LANCING DEVICE) MISC, 1 each., Disp: , Rfl:  .  latanoprost (XALATAN) 0.005 % ophthalmic solution, Apply 1 drop to eye Nightly., Disp: , Rfl:  .  Magnesium Oxide 500 MG CAPS, Take 1 capsule by mouth daily., Disp: , Rfl:  .  metoprolol succinate (TOPROL-XL) 100 MG 24 hr tablet, TAKE 1 TABLET BY MOUTH EVERY DAY, Disp: 90 tablet, Rfl: 1 .  zolpidem (AMBIEN) 5 MG tablet, Take 1 tablet by mouth every evening., Disp: , Rfl:   Allergies  Allergen Reactions  . Lisinopril Swelling     ROS  Constitutional: Negative for fever or weight change.  Respiratory: Negative for cough and shortness of breath.   Cardiovascular: Negative for chest pain or palpitations.  Gastrointestinal: Negative for abdominal pain, no bowel changes.  Musculoskeletal: Negative for gait problem or joint swelling.  Skin: Negative for rash.  Neurological: Negative for dizziness or headache.  No other specific complaints in a complete review of systems (except as listed in HPI above).   Objective  Filed Vitals:   08/27/15 0924  BP: 128/68  Pulse: 81  Temp: 98.5 F (36.9 C)  TempSrc: Oral  Resp: 16  Height: 5' 9"  (1.753 m)  Weight: 238 lb 9.6 oz (108.228 kg)  SpO2: 95%    Body  mass index is 35.22 kg/(m^2).  Physical Exam  Constitutional: Patient appears well-developed and well-nourished. Obese No distress.  HEENT: head atraumatic, normocephalic, pupils equal and reactive to light, decrease rom of neck Cardiovascular: Normal rate, regular rhythm and normal heart sounds.  No murmur heard. Plus 1  BLE edema. Pulmonary/Chest: Effort normal and breath sounds normal. No respiratory distress. Abdominal: Soft.  There is no tenderness. Psychiatric: Patient has a normal mood and affect. behavior is normal. Judgment and thought content normal. Muscular Skeletal: crepitus with extension of both knees, pain with rom of her neck, no effusion or redness.   Recent Results (from the past 2160 hour(s))  POCT HgB  A1C     Status: Normal   Collection Time: 08/27/15  9:28 AM  Result Value Ref Range   Hemoglobin A1C 5.7     PHQ2/9: Depression screen [facility] 2/9 08/27/2015 04/26/2015  Decreased Interest 0 0  Down, Depressed, Hopeless 0 0  PHQ - 2 Score 0 0     Fall Risk: Fall Risk  08/27/2015 04/26/2015  Falls in the past year? No Yes  Number falls in past yr: - 1  Injury with Fall? - No    Functional Status Survey: Is the patient deaf or have difficulty hearing?: No Does the patient have difficulty seeing, even when wearing glasses/contacts?: Yes (glasses) Does the patient have difficulty concentrating, remembering, or making decisions?: No Does the patient have difficulty walking or climbing stairs?: No Does the patient have difficulty dressing or bathing?: No Does the patient have difficulty doing errands alone such as visiting a doctor's office or shopping?: No    Assessment & Plan  1. Type 2 diabetes mellitus with stage 2 chronic kidney disease, without long-term current use of insulin (HCC)  Continue diet, and try to resume physical activity and weight loss - POCT HgB A1C  2. Benign essential HTN  At goal, continue medications, some of the refills recently  sent to pharmacy by Dr. Clayborn Bigness  3. Chronic kidney disease (CKD), stage III (moderate)  Recheck labs yearly   4. Primary osteoarthritis involving multiple joints  Seeing Dr. Rudene Christians - HYDROcodone-acetaminophen (NORCO/VICODIN) 5-325 MG tablet; Take 1 tablet by mouth 2 (two) times daily.  Dispense: 60 tablet; Refill: 0  5. Dyslipidemia  Continue statin therapy, no side effects  6. Morbid obesity, unspecified obesity type [facility])  Discussed with the patient the risk posed by an increased BMI. Discussed importance of portion control, calorie counting and at least 150 minutes of physical activity weekly. Avoid sweet beverages and drink more water. Eat at least 6 servings of fruit and vegetables daily   7. Obstructive apnea  Wears CPAP every night, wakes up feeling well, no headaches upon awakening.

## 2015-09-03 DIAGNOSIS — L851 Acquired keratosis [keratoderma] palmaris et plantaris: Secondary | ICD-10-CM | POA: Diagnosis not present

## 2015-09-03 DIAGNOSIS — E1142 Type 2 diabetes mellitus with diabetic polyneuropathy: Secondary | ICD-10-CM | POA: Diagnosis not present

## 2015-09-03 DIAGNOSIS — B351 Tinea unguium: Secondary | ICD-10-CM | POA: Diagnosis not present

## 2015-09-25 DIAGNOSIS — L811 Chloasma: Secondary | ICD-10-CM | POA: Diagnosis not present

## 2015-10-09 ENCOUNTER — Other Ambulatory Visit: Payer: Self-pay | Admitting: Family Medicine

## 2015-10-09 NOTE — Telephone Encounter (Signed)
Patient requesting refill. 

## 2015-10-14 ENCOUNTER — Other Ambulatory Visit: Payer: Self-pay | Admitting: Family Medicine

## 2015-11-21 DIAGNOSIS — M25552 Pain in left hip: Secondary | ICD-10-CM | POA: Diagnosis not present

## 2015-11-21 DIAGNOSIS — M1711 Unilateral primary osteoarthritis, right knee: Secondary | ICD-10-CM | POA: Diagnosis not present

## 2015-11-21 DIAGNOSIS — M247 Protrusio acetabuli: Secondary | ICD-10-CM | POA: Diagnosis not present

## 2015-11-24 ENCOUNTER — Other Ambulatory Visit: Payer: Self-pay | Admitting: Family Medicine

## 2015-11-26 ENCOUNTER — Encounter: Payer: Self-pay | Admitting: Family Medicine

## 2015-11-26 ENCOUNTER — Ambulatory Visit (INDEPENDENT_AMBULATORY_CARE_PROVIDER_SITE_OTHER): Payer: BLUE CROSS/BLUE SHIELD | Admitting: Family Medicine

## 2015-11-26 VITALS — BP 116/70 | HR 84 | Temp 97.8°F | Resp 16 | Wt 240.6 lb

## 2015-11-26 DIAGNOSIS — E1122 Type 2 diabetes mellitus with diabetic chronic kidney disease: Secondary | ICD-10-CM

## 2015-11-26 DIAGNOSIS — M150 Primary generalized (osteo)arthritis: Secondary | ICD-10-CM | POA: Diagnosis not present

## 2015-11-26 DIAGNOSIS — E785 Hyperlipidemia, unspecified: Secondary | ICD-10-CM | POA: Diagnosis not present

## 2015-11-26 DIAGNOSIS — Z01818 Encounter for other preprocedural examination: Secondary | ICD-10-CM | POA: Diagnosis not present

## 2015-11-26 DIAGNOSIS — N183 Chronic kidney disease, stage 3 unspecified: Secondary | ICD-10-CM

## 2015-11-26 DIAGNOSIS — G4700 Insomnia, unspecified: Secondary | ICD-10-CM | POA: Diagnosis not present

## 2015-11-26 DIAGNOSIS — G4733 Obstructive sleep apnea (adult) (pediatric): Secondary | ICD-10-CM

## 2015-11-26 DIAGNOSIS — M159 Polyosteoarthritis, unspecified: Secondary | ICD-10-CM

## 2015-11-26 DIAGNOSIS — Z79899 Other long term (current) drug therapy: Secondary | ICD-10-CM | POA: Diagnosis not present

## 2015-11-26 DIAGNOSIS — R9431 Abnormal electrocardiogram [ECG] [EKG]: Secondary | ICD-10-CM | POA: Diagnosis not present

## 2015-11-26 DIAGNOSIS — Z1239 Encounter for other screening for malignant neoplasm of breast: Secondary | ICD-10-CM

## 2015-11-26 DIAGNOSIS — I10 Essential (primary) hypertension: Secondary | ICD-10-CM | POA: Diagnosis not present

## 2015-11-26 LAB — POCT GLYCOSYLATED HEMOGLOBIN (HGB A1C): Hemoglobin A1C: 5.7

## 2015-11-26 MED ORDER — ATORVASTATIN CALCIUM 40 MG PO TABS: 40.0000 mg | ORAL_TABLET | Freq: Every evening | ORAL | Status: DC

## 2015-11-26 MED ORDER — METOPROLOL SUCCINATE ER 100 MG PO TB24: 100.0000 mg | ORAL_TABLET | Freq: Every day | ORAL | Status: DC

## 2015-11-26 MED ORDER — HYDROCODONE-ACETAMINOPHEN 5-325 MG PO TABS: 1.0000 | ORAL_TABLET | Freq: Two times a day (BID) | ORAL | Status: DC

## 2015-11-26 MED ORDER — HYDROCHLOROTHIAZIDE 12.5 MG PO TABS: 12.5000 mg | ORAL_TABLET | Freq: Every day | ORAL | Status: DC

## 2015-11-26 NOTE — Progress Notes (Signed)
Name: Becky Trevino   MRN: [id number]    DOB: [birth year]   Date:11/26/2015       Progress Note  Subjective  Chief Complaint  Chief Complaint  Patient presents with  . Diabetes    patient is here for her 51-month f/u  . Hypertension  . Chronic Kidney Disease  . Osteoarthritis  . Obesity  . Apnea  . Hyperlipidemia    dyslipidemia    HPI  DMII with renal manifestation: not checking her fsbs at home, glucose has been well controlled with diet, lhbgA1C is still at goal. Last urine micro was 20. Doing well, no polyphagia, polyuria or polydipsia. She has been off medication for months now.   HTN: taking medication as prescribed very seldom has some orthostatic symptoms - dizziness when getting up quickly. No chest pain, no SOB or palpitation.   Hyperlipidemia: taking Atorvastatin daily and denies side effects. No chest pain   Insomnia: taking Ambien prn only and is doing well, no side effects such as nightmares, sleeping walking, no amnesia.  Cervical DDD: s/p neck fusion in March 2015, still has pain and takes Gabapentin and hydrocodone only when having severe pain  Right knee Pain: chronic, she has OA, due for a replacement , however recently seen by Dr. Rudene Christians and needs to have left hip replace prior to right knee, because he is worried that she may fall and fracture her hip.  Takes Hydrocodone prn only and makes it last three months. She is s/p left knee replacement  OSA: she uses CPAP every night, for most of the night and sometimes all night.   Obesity: weight has been stable, hopefully will improve after hip and knee replacement so she can exercise more. Currently using treadmill and elliptical machine  Preop: Dr. Rudene Christians is requesting pre-op clearance for left hip replacement on March 28th, 2017. She is able to exercise on treadmill for 2 miles without stopping, no SOB with activity, no chest pain. Taking beta-blocker for years, on aspirin and advised to stop one week prior to  surgery. She had surgeries in the past and not history of anesthesia complications, she wears dentures on top and bottom. No previous history of DVT. She had a neck fusion. She has family support and plans on going home after surgery.   Patient Active Problem List   Diagnosis Date Noted  . Benign essential HTN 04/21/2015  . Edema leg 04/21/2015  . Chronic kidney disease (CKD), stage III (moderate) 04/21/2015  . Chronic venous insufficiency 04/21/2015  . DDD (degenerative disc disease), cervical 04/21/2015  . Diabetes mellitus with renal manifestation ([place]) 04/21/2015  . Dyslipidemia 04/21/2015  . Bladder cystocele 04/21/2015  . Urinary incontinence 04/21/2015  . Leg varices 04/21/2015  . Insomnia 04/21/2015  . Eczema intertrigo 04/21/2015  . Obesity, morbid ([place]) 04/21/2015  . Obstructive apnea 04/21/2015  . Menopausal and perimenopausal disorder 04/21/2015  . Supraventricular premature beats 04/21/2015  . Osteoarthritis of multiple joints 04/21/2015  . H/O Spinal surgery 11/14/2013  . Detrusor muscle hypertonia 11/07/2013    Past Surgical History  Procedure Laterality Date  . Replacement total knee Left 03/2010    Dr. Rudene Christians  . Bunionectomy Bilateral 1993  . Abdominal hysterectomy  1993    Total  . Laminectomy  11/14/2013    [facility], Dr. Delilah Shan  . Laser ablation Bilateral 07/29/2012    Dr. Lucky Cowboy    Family History  Problem Relation Age of Onset  . Diabetes Mother   . Hyperlipidemia Mother   .  Hypertension Mother   . Diabetes Father   . Hyperlipidemia Father   . Hypertension Father   . Obesity Father   . Hypertension Sister   . Hyperlipidemia Sister   . Hyperlipidemia Sister     Social History   Social History  . Marital Status: Married    Spouse Name: N/A  . Number of Children: N/A  . Years of Education: N/A   Occupational History  . Not on file.   Social History Main Topics  . Smoking status: Never Smoker   . Smokeless tobacco: Never Used  . Alcohol Use: No   . Drug Use: No  . Sexual Activity: Yes   Other Topics Concern  . Not on file   Social History Narrative     Current outpatient prescriptions:  .  acetaminophen (TYLENOL) 500 MG tablet, Take 1 tablet by mouth as needed., Disp: , Rfl:  .  aspirin 81 MG chewable tablet, Chew 1 tablet by mouth daily., Disp: , Rfl:  .  atorvastatin (LIPITOR) 40 MG tablet, Take 1 tablet (40 mg total) by mouth every evening., Disp: 90 tablet, Rfl: 1 .  Calcium Carbonate-Vitamin D (CALCIUM 600-D) 600-400 MG-UNIT per tablet, Take 1 tablet by mouth 2 (two) times daily., Disp: , Rfl:  .  celecoxib (CELEBREX) 200 MG capsule, Take 200 mg by mouth as needed., Disp: , Rfl:  .  Elastic Bandages & Supports (MEDICAL COMPRESSION STOCKINGS) MISC, , Disp: , Rfl:  .  furosemide (LASIX) 20 MG tablet, Take 1 tablet by mouth daily., Disp: , Rfl: 6 .  gabapentin (NEURONTIN) 300 MG capsule, TAKE 1 CAPSULE (300 MG TOTAL) BY MOUTH EVERY EVENING., Disp: 90 capsule, Rfl: 1 .  glucose blood (ONE TOUCH ULTRA TEST) test strip, Use as instructed, Disp: 100 each, Rfl: 12 .  hydrochlorothiazide (HYDRODIURIL) 12.5 MG tablet, Take 1 tablet (12.5 mg total) by mouth daily., Disp: 90 tablet, Rfl: 1 .  HYDROcodone-acetaminophen (NORCO/VICODIN) 5-325 MG tablet, Take 1 tablet by mouth 2 (two) times daily., Disp: 60 tablet, Rfl: 0 .  imipramine (TOFRANIL) 25 MG tablet, Take 1 tablet by mouth daily., Disp: , Rfl:  .  ketoconazole (NIZORAL) 2 % cream, Apply 1 application topically 2 (two) times daily., Disp: , Rfl:  .  Lancet Devices ([employer] LANCING DEVICE) MISC, 1 each., Disp: , Rfl:  .  latanoprost (XALATAN) 0.005 % ophthalmic solution, Apply 1 drop to eye Nightly., Disp: , Rfl:  .  Magnesium Oxide 500 MG CAPS, Take 1 capsule by mouth daily., Disp: , Rfl:  .  metoprolol succinate (TOPROL-XL) 100 MG 24 hr tablet, Take 1 tablet (100 mg total) by mouth daily. Take with or immediately following a meal., Disp: 90 tablet, Rfl: 1 .  zolpidem (AMBIEN) 5 MG  tablet, Take 1 tablet by mouth every evening., Disp: , Rfl:   Allergies  Allergen Reactions  . Lisinopril Swelling     ROS  Constitutional: Negative for fever or significant  weight change.  Respiratory: Negative for cough and shortness of breath.   Cardiovascular: Negative for chest pain or palpitations.  Gastrointestinal: Negative for abdominal pain, no bowel changes.  Musculoskeletal: Positive for gait problem and  joint swelling.  Skin: Negative for rash.  Neurological: Negative for dizziness or headache.  No other specific complaints in a complete review of systems (except as listed in HPI above).  Objective  Filed Vitals:   11/26/15 0923  BP: 116/70  Pulse: 84  Temp: 97.8 F (36.6 C)  TempSrc: Oral  Resp: 16  Weight: 240 lb 9.6 oz (109.135 kg)  SpO2: 96%    Body mass index is 35.51 kg/(m^2).  Physical Exam  Constitutional: Patient appears well-developed and well-nourished. Obese No distress.  HEENT: head atraumatic, normocephalic, pupils equal and reactive to light, neck supple, throat within normal limits Cardiovascular: Normal rate, regular rhythm and normal heart sounds.  No murmur heard. No BLE edema. Pulmonary/Chest: Effort normal and breath sounds normal. No respiratory distress. Abdominal: Soft.  There is no tenderness. Psychiatric: Patient has a normal mood and affect. behavior is normal. Judgment and thought content normal. Muscular Skeletal: pain during rom of left hip, some weakness of left quad muscles, crepitus with extension of right knee.   Recent Results (from the past 2160 hour(s))  POCT glycosylated hemoglobin (Hb A1C)     Status: Normal   Collection Time: 11/26/15  9:29 AM  Result Value Ref Range   Hemoglobin A1C 5.7      PHQ2/9: Depression screen [facility] 2/9 11/26/2015 08/27/2015 04/26/2015  Decreased Interest 0 0 0  Down, Depressed, Hopeless 0 0 0  PHQ - 2 Score 0 0 0     Fall Risk: Fall Risk  11/26/2015 08/27/2015 04/26/2015  Falls in  the past year? No No Yes  Number falls in past yr: - - 1  Injury with Fall? - - No      Functional Status Survey: Is the patient deaf or have difficulty hearing?: No Does the patient have difficulty seeing, even when wearing glasses/contacts?: No Does the patient have difficulty concentrating, remembering, or making decisions?: No Does the patient have difficulty walking or climbing stairs?: Yes (due to left hip pain and right knee pain) Does the patient have difficulty dressing or bathing?: No Does the patient have difficulty doing errands alone such as visiting a doctor's office or shopping?: No    Assessment & Plan  1. Type 2 diabetes mellitus with stage 3 chronic kidney disease, without long-term current use of insulin ([place])  Off medication and doing well  2. Benign essential HTN  - metoprolol succinate (TOPROL-XL) 100 MG 24 hr tablet; Take 1 tablet (100 mg total) by mouth daily. Take with or immediately following a meal.  Dispense: 90 tablet; Refill: 1 - hydrochlorothiazide (HYDRODIURIL) 12.5 MG tablet; Take 1 tablet (12.5 mg total) by mouth daily.  Dispense: 90 tablet; Refill: 1 - Comprehensive metabolic panel - CBC with Differential/Platelet - EKG 12-Lead  3. Obstructive apnea  Continue CPAP use  4. Chronic kidney disease (CKD), stage III (moderate)  Get labs  5. Primary osteoarthritis involving multiple joints  - HYDROcodone-acetaminophen (NORCO/VICODIN) 5-325 MG tablet; Take 1 tablet by mouth 2 (two) times daily.  Dispense: 60 tablet; Refill: 0  6. Dyslipidemia  - atorvastatin (LIPITOR) 40 MG tablet; Take 1 tablet (40 mg total) by mouth every evening.  Dispense: 90 tablet; Refill: 1 - Lipid panel  7. Insomnia  Doing well on prn medication   8. Pre-op exam  - CBC with Differential/Platelet - EKG 12-Lead - abnormal -referral made to Dr. Cardiologist  9. Breast cancer screening  - MM Digital Screening; Future  10. Long-term use of high-risk  medication  Based on her surgery she has a low risk of complications.  - Comprehensive metabolic panel - CBC with Differential/Platelet

## 2015-11-27 LAB — CBC WITH DIFFERENTIAL/PLATELET
BASOS: 0 %
Basophils Absolute: 0 10*3/uL (ref 0.0–0.2)
EOS (ABSOLUTE): 0.3 10*3/uL (ref 0.0–0.4)
Eos: 3 %
Hematocrit: 40.9 % (ref 34.0–46.6)
Hemoglobin: 13.2 g/dL (ref 11.1–15.9)
IMMATURE GRANULOCYTES: 0 %
Immature Grans (Abs): 0 10*3/uL (ref 0.0–0.1)
Lymphocytes Absolute: 2.3 10*3/uL (ref 0.7–3.1)
Lymphs: 25 %
MCH: 30.9 pg (ref 26.6–33.0)
MCHC: 32.3 g/dL (ref 31.5–35.7)
MCV: 96 fL (ref 79–97)
MONOS ABS: 0.6 10*3/uL (ref 0.1–0.9)
Monocytes: 7 %
NEUTROS PCT: 65 %
Neutrophils Absolute: 5.9 10*3/uL (ref 1.4–7.0)
PLATELETS: 224 10*3/uL (ref 150–379)
RBC: 4.27 x10E6/uL (ref 3.77–5.28)
RDW: 15.1 % (ref 12.3–15.4)
WBC: 9.1 10*3/uL (ref 3.4–10.8)

## 2015-11-27 LAB — COMPREHENSIVE METABOLIC PANEL
A/G RATIO: 1.3 (ref 1.2–2.2)
ALK PHOS: 147 IU/L — AB (ref 39–117)
ALT: 20 IU/L (ref 0–32)
AST: 20 IU/L (ref 0–40)
Albumin: 4.3 g/dL (ref 3.5–4.8)
BUN/Creatinine Ratio: 21 (ref 11–26)
BUN: 27 mg/dL (ref 8–27)
Bilirubin Total: 0.3 mg/dL (ref 0.0–1.2)
CO2: 27 mmol/L (ref 18–29)
Calcium: 9.5 mg/dL (ref 8.7–10.3)
Chloride: 97 mmol/L (ref 96–106)
Creatinine, Ser: 1.28 mg/dL — ABNORMAL HIGH (ref 0.57–1.00)
GFR calc Af Amer: 49 mL/min/{1.73_m2} — ABNORMAL LOW (ref >59)
GFR calc non Af Amer: 42 mL/min/{1.73_m2} — ABNORMAL LOW (ref >59)
GLOBULIN, TOTAL: 3.3 g/dL (ref 1.5–4.5)
Glucose: 88 mg/dL (ref 65–99)
POTASSIUM: 4.3 mmol/L (ref 3.5–5.2)
SODIUM: 142 mmol/L (ref 134–144)
Total Protein: 7.6 g/dL (ref 6.0–8.5)

## 2015-11-27 LAB — LIPID PANEL
CHOLESTEROL TOTAL: 166 mg/dL (ref 100–199)
Chol/HDL Ratio: 2 ratio units (ref 0.0–4.4)
HDL: 81 mg/dL (ref >39)
LDL CALC: 74 mg/dL (ref 0–99)
TRIGLYCERIDES: 56 mg/dL (ref 0–149)
VLDL CHOLESTEROL CAL: 11 mg/dL (ref 5–40)

## 2015-11-28 ENCOUNTER — Encounter
Admission: RE | Admit: 2015-11-28 | Discharge: 2015-11-28 | Disposition: A | Discharge status: Home / Self Care | Payer: BLUE CROSS/BLUE SHIELD | Source: Ambulatory Visit | Attending: Orthopedic Surgery | Admitting: Orthopedic Surgery

## 2015-11-28 ENCOUNTER — Other Ambulatory Visit: Payer: Self-pay | Admitting: Family Medicine

## 2015-11-28 DIAGNOSIS — Z01812 Encounter for preprocedural laboratory examination: Secondary | ICD-10-CM | POA: Insufficient documentation

## 2015-11-28 LAB — URINALYSIS COMPLETE WITH MICROSCOPIC (ARMC ONLY)
Bilirubin Urine: NEGATIVE
Glucose, UA: NEGATIVE mg/dL
Ketones, ur: NEGATIVE mg/dL
LEUKOCYTES UA: NEGATIVE
Nitrite: NEGATIVE
PH: 7 (ref 5.0–8.0)
PROTEIN: NEGATIVE mg/dL
Specific Gravity, Urine: 1.014 (ref 1.005–1.030)
WBC UA: NONE SEEN WBC/hpf (ref 0–5)

## 2015-11-28 LAB — SURGICAL PCR SCREEN
MRSA, PCR: POSITIVE — AB
STAPHYLOCOCCUS AUREUS: POSITIVE — AB

## 2015-11-28 LAB — SEDIMENTATION RATE: SED RATE: 34 mm/h — AB (ref 0–30)

## 2015-11-28 LAB — ABO/RH: ABO/RH(D): A POS

## 2015-11-28 LAB — TYPE AND SCREEN
ABO/RH(D): A POS
Antibody Screen: NEGATIVE

## 2015-11-28 LAB — PROTIME-INR
INR: 1.05
Prothrombin Time: 13.9 seconds (ref 11.4–15.0)

## 2015-11-28 LAB — APTT: aPTT: 27 seconds (ref 24–36)

## 2015-11-28 NOTE — Telephone Encounter (Signed)
Patient requesting refill. 

## 2015-11-28 NOTE — Patient Instructions (Signed)
  Your procedure is scheduled on: Tuesday 12/11/15 Report to Day Surgery. 2nd floor medical mall entrance To find out your arrival time please call [phone] between 1PM - 3PM on Monday 12/10/15.  Remember: Instructions that are not followed completely may result in serious medical risk, up to and including death, or upon the discretion of your surgeon and anesthesiologist your surgery may need to be rescheduled.    __x__ 1. Do not eat food or drink liquids after midnight. No gum chewing or hard candies.     __x__ 2. No Alcohol for 24 hours before or after surgery.   ____ 3. Bring all medications with you on the day of surgery if instructed.    __x__ 4. Notify your doctor if there is any change in your medical condition     (cold, fever, infections).     Do not wear jewelry, make-up, hairpins, clips or nail polish.  Do not wear lotions, powders, or perfumes. .  Do not shave 48 hours prior to surgery. Men may shave face and neck.  Do not bring valuables to the hospital.    [facility] is not responsible for any belongings or valuables.               Contacts, dentures or bridgework may not be worn into surgery.  Leave your suitcase in the car. After surgery it may be brought to your room.  For patients admitted to the hospital, discharge time is determined by your                treatment team.   Patients discharged the day of surgery will not be allowed to drive home.   Please read over the following fact sheets that you were given:   MRSA Information and Surgical Site Infection Prevention   __x__ Take these medicines the morning of surgery with A SIP OF WATER:    1. Take medications as usual the evening before surgery  2.   3.   4.  5.  6.  ____ Fleet Enema (as directed)   __x__ Use CHG Soap as directed  ____ Use inhalers on the day of surgery  ____ Stop metformin 2 days prior to surgery    ____ Take 1/2 of usual insulin dose the night before surgery and none on the  morning of surgery.   __x__ Stop Coumadin/Plavix/aspirin on 10 days before surgery  ____ Stop Anti-inflammatories on    ____ Stop supplements until after surgery.    ____ Bring C-Pap to the hospital.

## 2015-11-29 DIAGNOSIS — R600 Localized edema: Secondary | ICD-10-CM | POA: Diagnosis not present

## 2015-11-29 DIAGNOSIS — Z01818 Encounter for other preprocedural examination: Secondary | ICD-10-CM | POA: Diagnosis not present

## 2015-11-29 DIAGNOSIS — E784 Other hyperlipidemia: Secondary | ICD-10-CM | POA: Diagnosis not present

## 2015-11-29 DIAGNOSIS — I10 Essential (primary) hypertension: Secondary | ICD-10-CM | POA: Diagnosis not present

## 2015-11-29 DIAGNOSIS — R0602 Shortness of breath: Secondary | ICD-10-CM | POA: Diagnosis not present

## 2015-11-29 NOTE — Pre-Procedure Instructions (Signed)
Abnormal labs +MRSA PCR faxed to Dr Rudene Christians office message left with [place].

## 2015-11-29 NOTE — Pre-Procedure Instructions (Signed)
Called Pt to inform her to bring her  CPAP on DOS

## 2015-11-30 LAB — URINE CULTURE

## 2015-12-04 DIAGNOSIS — E1142 Type 2 diabetes mellitus with diabetic polyneuropathy: Secondary | ICD-10-CM | POA: Diagnosis not present

## 2015-12-04 DIAGNOSIS — L851 Acquired keratosis [keratoderma] palmaris et plantaris: Secondary | ICD-10-CM | POA: Diagnosis not present

## 2015-12-04 DIAGNOSIS — B351 Tinea unguium: Secondary | ICD-10-CM | POA: Diagnosis not present

## 2015-12-04 MED ORDER — KETOROLAC TROMETHAMINE 30 MG/ML IJ SOLN
INTRAMUSCULAR | Status: AC
  Filled 2015-12-04: qty 1

## 2015-12-04 MED ORDER — FENTANYL CITRATE (PF) 100 MCG/2ML IJ SOLN
INTRAMUSCULAR | Status: AC
  Filled 2015-12-04: qty 2

## 2015-12-05 DIAGNOSIS — Z01818 Encounter for other preprocedural examination: Secondary | ICD-10-CM | POA: Diagnosis not present

## 2015-12-05 DIAGNOSIS — R0602 Shortness of breath: Secondary | ICD-10-CM | POA: Diagnosis not present

## 2015-12-06 NOTE — Pre-Procedure Instructions (Signed)
URINE CULTURE RESULTS FAXED TO DR [facility]

## 2015-12-07 NOTE — Pre-Procedure Instructions (Signed)
SPOKE WITH [facility],ECHO/STRESS DONE 12/05/15 AND WILL FAX WITH [facility] AS SOON AS DR CALLWOOD COMPLETES

## 2015-12-10 NOTE — Pre-Procedure Instructions (Signed)
ANESTHESIA - CARDIOLOGY CLEARANCE, ECHO AND STRESS TEST   Result Impression   Normal myocardial perfusion scan no evidence of stress-induced  myocardial ischemia ejection fraction of 72% conclusion negative scan ,  suggest to be an acceptable surgical risk   Result Narrative  Procedure: Pharmacologic Myocardial Perfusion Imaging   ONE day procedure  Indication: SOB (shortness of breath) - Plan: NM myocardial perfusion  SPECT multiple (stress and rest), ECG stress test only  Pre-operative clearance - Plan: NM myocardial perfusion SPECT multiple  (stress and rest), ECG stress test only Ordering Physician:   Dr. Lujean Amel   Clinical History: 70 y.o. year old female preop clearance for surgery Vitals: Height: 71 in Weight: 243 lb Cardiac risk factors include:    Hyperlipidemia, Diabetes, HTN, Family Hx CAD and Obesity    Procedure:  Pharmacologic stress testing was performed with Regadenoson using a single  use 0.4mg /27ml (0.08 mg/ml) prefilled syringe intravenously infused as a  bolus dose. The stress test was stopped due to Infusion completion.  Blood  pressure response was normal.    Rest HR: 65bpm Rest BP: 148/11mmHg Max HR: 85bpm Min BP: 148/30mmHg  Stress Test Administered by: Oswald Hillock, CMA  ECG Interpretation: Rest ECG:  normal sinus rhythm, none Stress ECG:  normal sinus rhythm,  Recovery ECG:  normal sinus rhythm ECG Interpretation:  non-diagnostic due to pharmacologic testing.   Administrations This Visit    regadenoson (LEXISCAN) 0.4 mg/5 mL inj syringe 0.4 mg    Admin Date Action Dose Route Administered By      12/05/2015 Given 0.4 mg Intravenous Roxine Caddy Goard, CNMT            technetium Tc68m sestamibi (CARDIOLITE) injection 0000000 millicurie    Admin Date Action Dose Route Administered By      [id number] Given 0000000 millicurie Intravenous Scott N Goard, CNMT            technetium Tc74m sestamibi (CARDIOLITE) injection [id number]  millicurie    Admin Date Action Dose Route Administered By      [id number] Given [id number] millicurie Intravenous Scott N Goard, CNMT              Gated post-stress perfusion imaging was performed 30 minutes after stress.  Rest images were performed 30 minutes after injection.  Gated LV Analysis:  TID Ratio: 1.15  LVEF= 72 %  FINDINGS: Regional wall motion:  reveals normal myocardial thickening and wall  motion. The overall quality of the study is good.   Artifacts noted: no Left ventricular cavity: normal.  Perfusion Analysis:  SPECT images demonstrate homogeneous tracer  distribution throughout the myocardium.     Status Results Details   Encounter Summary      LV Ejection Fraction (%) 55   Aortic Valve Stenosis Grade none   Aortic Valve Regurgitation Grade none   Aortic Valve Max Velocity (m/s) 1.5 m/sec   Aortic Valve Stenosis Mean Gradient (mmHg) 5.0 mmHg   Mitral Valve Regurgitation Grade mild   Mitral Valve Stenosis Grade none   Tricuspid Valve Regurgitation Grade moderate   Tricuspid Valve Regurgitation Max Velocity (m/s) 2.8 m/sec   Right Ventricle Systolic Pressure (mmHg) A999333 mmHg   LV End Diastolic Diameter (cm) 4.4 cm  LV End Systolic Diameter (cm) 3.2 cm  LV Septum Wall Thickness (cm) 1 cm  LV Posterior Wall Thickness (cm) 1 cm  Left Atrium Diameter (cm) 4 cm   Result Narrative  CARDIOLOGY DEPARTMENT                      Becky Trev[facility], [facility]                         [id number]                  [facility]                    Acct #: [id number]        [address] [place], [place] [address]          Date: 12/05/2015 08:24 AM                                                              Adult Female Age: 70 yrs                   ECHOCARDIOGRAM REPORT                      Outpatient                                                              KC::KCWC         STUDY:CHEST WALL          TAPE:0000:00: 0:00:00       MD1:  CALLWOOD, DWAYNE DENNIS          ECHO:Yes   DOPPLER:Yes  FILE:0000-000-000           BP: 120/84 mmHg         COLOR:Yes  CONTRAST:No       MACHINE:Philips         Height: 71 in     RV BIOPSY:No         3D:No    SOUND QLTY:Moderate        Weight: 243 lb        MEDIUM:None                                           BSA: 2.3 m2  ___________________________________________________________________________________________      HISTORY:DOE       REASON:Assess, LV function   INDICATION:R06.02 Shortness of breath, Z01.818 Pre-operative clearance  ___________________________________________________________________________________________ ECHOCARDIOGRAPHIC MEASUREMENTS 2D DIMENSIONS AORTA             Values      Normal Range      MAIN PA          Values      Normal Range           Annulus:  nm*       [2.1 - 2.5]  PA Main:  nm*       [1.5 - 2.1]         Aorta Sin:  nm*       [2.7 - 3.3]       RIGHT VENTRICLE       ST Junction:  nm*       [2.3 - 2.9]                RV Base:  4.2 cm    [ < 4.2]         Asc.Aorta:  nm*       [2.3 - 3.1]                 RV Mid:  nm*       [ < 3.5]  LEFT VENTRICLE                                        RV Length:  nm*       [ < 8.6]             LVIDd:  4.4 cm    [3.9 - 5.3]       INFERIOR VENA CAVA             LVIDs:  3.2 cm                              Max. IVC:  nm*       [ <= 2.1]                FS:  28.4 %    [> 25]                    Min. IVC:  nm*               SWT:  1.0 cm    [0.5 - 0.9]                   ------------------               PWT:  1.0 cm    [0.5 - 0.9]                   nm* - not measured  LEFT ATRIUM           LA Diam:  4.0 cm    [2.7 - 3.8]       LA A4C Area:  nm*       [ < 20]         LA Volume:  nm*       [22 - 52]  ___________________________________________________________________________________________ ECHOCARDIOGRAPHIC DESCRIPTIONS  AORTIC ROOT         Size:Normal   Dissection:INDETERM FOR  DISSECTION  AORTIC VALVE     Leaflets:Tricuspid          Morphology:Normal     Mobility:Fully mobile  LEFT VENTRICLE         Size:Normal               Anterior:Normal  Contraction:Normal                Lateral:Normal   Closest EF:>55% (Estimated)       Septal:Normal    LV Masses:No Masses  Apical:Normal          [id number]                 Inferior:Normal                                  Posterior:Normal Willette Brace.FxClass:(Grade 1) relaxation abnormal, E/A reversal  MITRAL VALVE     Leaflets:Normal               Mobility:Fully mobile   Morphology:Normal  LEFT ATRIUM         Size:MILDLY ENLARGED     LA Masses:No masses    IA Septum:Normal IAS  MAIN PA         Size:Normal  PULMONIC VALVE   Morphology:Normal               Mobility:Fully mobile  RIGHT VENTRICLE         Size:MILDLY ENLARGED     Free Wall:Normal  Contraction:Normal              RV Masses:No Masses  TRICUSPID VALVE     Leaflets:Normal               Mobility:Fully mobile   Morphology:Normal  RIGHT ATRIUM         Size:MILDLY ENLARGED      RA Other:None      RA Mass:No masses  PERICARDIUM        Fluid:No effusion  INFERIOR VENACAVA         Size:Not seen Not Seen   ____________________________________________________________________ DOPPLER ECHO and OTHER SPECIAL PROCEDURES    Aortic:No AR                         No AS           148.0 cm/sec peak vel         8.8 mmHg peak grad           5.0 mmHg mean grad            2.5 cm^2 by DOPPLER     Mitral:MILD MR                       No MS                                         4.1 cm^2 by DOPPLER           MV Inflow E Vel=79.5 cm/sec   MV Annulus E'Vel=7.5 cm/sec           E/E'Ratio=10.6  Tricuspid:MODERATE TR                   No TS           282.0 cm/sec peak TR vel      41.8 mmHg peak RV pressure  Pulmonary:TRIVIAL PR                    No  PS     ___________________________________________________________________________________________ INTERPRETATION NORMAL LEFT VENTRICULAR SYSTOLIC FUNCTION WITH AN ESTIMATED EF = 55 % NORMAL RIGHT VENTRICULAR SYSTOLIC FUNCTION MODERATE TRICUSPID VALVE INSUFFICIENCY MILD MITRAL VALVE INSUFFICIENCY NO VALVULAR STENOSIS MILD RV ENLARGEMENT MILD BIATRIAL ENLARGEMENT   ___________________________________________________________________________________________ Electronically signed by: Lujean Amel, MD on 12/05/2015 01:06 PM  Performed By: Cephus Shelling, RVT       Ordering Physician: Lujean Amel ___________________________________________________________________________________________   Status Results Details   Encounter Summary

## 2015-12-11 ENCOUNTER — Inpatient Hospital Stay
Admission: RE | Admit: 2015-12-11 | Discharge: 2015-12-14 | DRG: 470 | Disposition: A | Discharge status: Home Health | Payer: BLUE CROSS/BLUE SHIELD | Source: Ambulatory Visit | Attending: Orthopedic Surgery | Admitting: Orthopedic Surgery

## 2015-12-11 ENCOUNTER — Inpatient Hospital Stay: Payer: BLUE CROSS/BLUE SHIELD

## 2015-12-11 ENCOUNTER — Inpatient Hospital Stay: Payer: BLUE CROSS/BLUE SHIELD | Admitting: Anesthesiology

## 2015-12-11 ENCOUNTER — Encounter
Admission: RE | Disposition: A | Discharge status: Home Health | Payer: Self-pay | Source: Ambulatory Visit | Attending: Orthopedic Surgery

## 2015-12-11 ENCOUNTER — Encounter: Payer: Self-pay | Admitting: *Deleted

## 2015-12-11 DIAGNOSIS — Z6835 Body mass index (BMI) 35.0-35.9, adult: Secondary | ICD-10-CM

## 2015-12-11 DIAGNOSIS — E669 Obesity, unspecified: Secondary | ICD-10-CM | POA: Diagnosis present

## 2015-12-11 DIAGNOSIS — M5030 Other cervical disc degeneration, unspecified cervical region: Secondary | ICD-10-CM | POA: Diagnosis present

## 2015-12-11 DIAGNOSIS — I129 Hypertensive chronic kidney disease with stage 1 through stage 4 chronic kidney disease, or unspecified chronic kidney disease: Secondary | ICD-10-CM | POA: Diagnosis not present

## 2015-12-11 DIAGNOSIS — E785 Hyperlipidemia, unspecified: Secondary | ICD-10-CM | POA: Diagnosis not present

## 2015-12-11 DIAGNOSIS — Z419 Encounter for procedure for purposes other than remedying health state, unspecified: Secondary | ICD-10-CM

## 2015-12-11 DIAGNOSIS — M1612 Unilateral primary osteoarthritis, left hip: Secondary | ICD-10-CM | POA: Diagnosis not present

## 2015-12-11 DIAGNOSIS — D62 Acute posthemorrhagic anemia: Secondary | ICD-10-CM | POA: Diagnosis not present

## 2015-12-11 DIAGNOSIS — M47812 Spondylosis without myelopathy or radiculopathy, cervical region: Secondary | ICD-10-CM | POA: Diagnosis present

## 2015-12-11 DIAGNOSIS — M247 Protrusio acetabuli: Secondary | ICD-10-CM | POA: Diagnosis present

## 2015-12-11 DIAGNOSIS — I739 Peripheral vascular disease, unspecified: Secondary | ICD-10-CM | POA: Diagnosis not present

## 2015-12-11 DIAGNOSIS — E1122 Type 2 diabetes mellitus with diabetic chronic kidney disease: Secondary | ICD-10-CM | POA: Diagnosis present

## 2015-12-11 DIAGNOSIS — G4733 Obstructive sleep apnea (adult) (pediatric): Secondary | ICD-10-CM | POA: Diagnosis not present

## 2015-12-11 DIAGNOSIS — N183 Chronic kidney disease, stage 3 (moderate): Secondary | ICD-10-CM | POA: Diagnosis not present

## 2015-12-11 DIAGNOSIS — Z7984 Long term (current) use of oral hypoglycemic drugs: Secondary | ICD-10-CM

## 2015-12-11 DIAGNOSIS — G8918 Other acute postprocedural pain: Secondary | ICD-10-CM

## 2015-12-11 HISTORY — PX: TOTAL HIP ARTHROPLASTY: SHX124

## 2015-12-11 LAB — CBC
HEMATOCRIT: 36.3 % (ref 35.0–47.0)
Hemoglobin: 12.2 g/dL (ref 12.0–16.0)
MCH: 32.2 pg (ref 26.0–34.0)
MCHC: 33.6 g/dL (ref 32.0–36.0)
MCV: 95.8 fL (ref 80.0–100.0)
PLATELETS: 171 10*3/uL (ref 150–440)
RBC: 3.78 MIL/uL — ABNORMAL LOW (ref 3.80–5.20)
RDW: 14.9 % — AB (ref 11.5–14.5)
WBC: 9.8 10*3/uL (ref 3.6–11.0)

## 2015-12-11 LAB — CREATININE, SERUM
Creatinine, Ser: 1.16 mg/dL — ABNORMAL HIGH (ref 0.44–1.00)
GFR calc non Af Amer: 47 mL/min — ABNORMAL LOW (ref >60)
GFR, EST AFRICAN AMERICAN: 54 mL/min — AB (ref >60)

## 2015-12-11 LAB — GLUCOSE, CAPILLARY
GLUCOSE-CAPILLARY: 90 mg/dL (ref 65–99)
GLUCOSE-CAPILLARY: 74 mg/dL (ref 65–99)

## 2015-12-11 SURGERY — ARTHROPLASTY, HIP, TOTAL, ANTERIOR APPROACH
Anesthesia: Spinal | Laterality: Left

## 2015-12-11 MED ORDER — ONDANSETRON HCL 4 MG/2ML IJ SOLN: 4.0000 mg | Freq: Once | INTRAMUSCULAR | Status: DC | PRN

## 2015-12-11 MED ORDER — ATORVASTATIN CALCIUM 20 MG PO TABS
40.0000 mg | ORAL_TABLET | Freq: Every evening | ORAL | Status: DC
  Administered 2015-12-11 – 2015-12-13 (×2): 40 mg via ORAL
  Filled 2015-12-11 (×4): qty 2

## 2015-12-11 MED ORDER — ASPIRIN 81 MG PO CHEW
81.0000 mg | CHEWABLE_TABLET | Freq: Every day | ORAL | Status: DC
  Administered 2015-12-11 – 2015-12-14 (×4): 81 mg via ORAL
  Filled 2015-12-11 (×4): qty 1

## 2015-12-11 MED ORDER — BISACODYL 5 MG PO TBEC
5.0000 mg | DELAYED_RELEASE_TABLET | Freq: Every day | ORAL | Status: DC | PRN
  Administered 2015-12-13 – 2015-12-14 (×2): 5 mg via ORAL
  Filled 2015-12-11 (×2): qty 1

## 2015-12-11 MED ORDER — MENTHOL 3 MG MT LOZG
1.0000 | LOZENGE | OROMUCOSAL | Status: DC | PRN
  Filled 2015-12-11: qty 9

## 2015-12-11 MED ORDER — ONDANSETRON HCL 4 MG/2ML IJ SOLN: 4.0000 mg | Freq: Four times a day (QID) | INTRAMUSCULAR | Status: DC | PRN

## 2015-12-11 MED ORDER — DOCUSATE SODIUM 100 MG PO CAPS
100.0000 mg | ORAL_CAPSULE | Freq: Two times a day (BID) | ORAL | Status: DC
  Administered 2015-12-11 – 2015-12-14 (×6): 100 mg via ORAL
  Filled 2015-12-11 (×6): qty 1

## 2015-12-11 MED ORDER — ONDANSETRON HCL 4 MG PO TABS: 4.0000 mg | ORAL_TABLET | Freq: Four times a day (QID) | ORAL | Status: DC | PRN

## 2015-12-11 MED ORDER — MUPIROCIN 2 % EX OINT
1.0000 "application " | TOPICAL_OINTMENT | Freq: Two times a day (BID) | CUTANEOUS | Status: DC
  Administered 2015-12-11 – 2015-12-14 (×6): 1 via NASAL
  Filled 2015-12-11: qty 22

## 2015-12-11 MED ORDER — NEOMYCIN-POLYMYXIN B GU 40-200000 IR SOLN
Status: DC | PRN
  Administered 2015-12-11: 4 mL

## 2015-12-11 MED ORDER — ZOLPIDEM TARTRATE 5 MG PO TABS: 5.0000 mg | ORAL_TABLET | Freq: Every evening | ORAL | Status: DC | PRN

## 2015-12-11 MED ORDER — SODIUM CHLORIDE 0.9 % IV SOLN: INTRAVENOUS | Status: DC

## 2015-12-11 MED ORDER — MAGNESIUM HYDROXIDE 400 MG/5ML PO SUSP
30.0000 mL | Freq: Every day | ORAL | Status: DC | PRN
  Administered 2015-12-13 (×2): 30 mL via ORAL
  Filled 2015-12-11: qty 30

## 2015-12-11 MED ORDER — CEFAZOLIN SODIUM-DEXTROSE 2-4 GM/100ML-% IV SOLN
2.0000 g | Freq: Four times a day (QID) | INTRAVENOUS | Status: AC
  Administered 2015-12-11 – 2015-12-12 (×3): 2 g via INTRAVENOUS
  Filled 2015-12-11 (×3): qty 100

## 2015-12-11 MED ORDER — TRANEXAMIC ACID 1000 MG/10ML IV SOLN
1000.0000 mg | INTRAVENOUS | Status: AC
  Administered 2015-12-11: 1000 mg via INTRAVENOUS
  Filled 2015-12-11: qty 10

## 2015-12-11 MED ORDER — METOCLOPRAMIDE HCL 5 MG/ML IJ SOLN: 5.0000 mg | Freq: Three times a day (TID) | INTRAMUSCULAR | Status: DC | PRN

## 2015-12-11 MED ORDER — MAGNESIUM OXIDE 400 (241.3 MG) MG PO TABS
400.0000 mg | ORAL_TABLET | Freq: Every day | ORAL | Status: DC
  Administered 2015-12-12 – 2015-12-14 (×3): 400 mg via ORAL
  Filled 2015-12-11 (×3): qty 1

## 2015-12-11 MED ORDER — METHOCARBAMOL 1000 MG/10ML IJ SOLN
500.0000 mg | Freq: Four times a day (QID) | INTRAVENOUS | Status: DC | PRN
  Filled 2015-12-11: qty 5

## 2015-12-11 MED ORDER — INSULIN ASPART 100 UNIT/ML ~~LOC~~ SOLN: 0.0000 [IU] | Freq: Three times a day (TID) | SUBCUTANEOUS | Status: DC

## 2015-12-11 MED ORDER — METHOCARBAMOL 500 MG PO TABS: 500.0000 mg | ORAL_TABLET | Freq: Four times a day (QID) | ORAL | Status: DC | PRN

## 2015-12-11 MED ORDER — FENTANYL CITRATE (PF) 100 MCG/2ML IJ SOLN
INTRAMUSCULAR | Status: AC
  Filled 2015-12-11: qty 2

## 2015-12-11 MED ORDER — NEOMYCIN-POLYMYXIN B GU 40-200000 IR SOLN
Status: AC
  Filled 2015-12-11: qty 4

## 2015-12-11 MED ORDER — MORPHINE SULFATE (PF) 2 MG/ML IV SOLN
2.0000 mg | INTRAVENOUS | Status: DC | PRN
  Administered 2015-12-11: 2 mg via INTRAVENOUS
  Filled 2015-12-11: qty 1

## 2015-12-11 MED ORDER — LATANOPROST 0.005 % OP SOLN
1.0000 [drp] | Freq: Every day | OPHTHALMIC | Status: DC
  Administered 2015-12-11 – 2015-12-13 (×3): 1 [drp] via OPHTHALMIC
  Filled 2015-12-11: qty 2.5

## 2015-12-11 MED ORDER — METOPROLOL SUCCINATE ER 50 MG PO TB24
100.0000 mg | ORAL_TABLET | Freq: Every evening | ORAL | Status: DC
  Administered 2015-12-12: 100 mg via ORAL
  Filled 2015-12-11 (×2): qty 2

## 2015-12-11 MED ORDER — VANCOMYCIN HCL 500 MG IV SOLR
500.0000 mg | Freq: Once | INTRAVENOUS | Status: AC
  Administered 2015-12-11: 500 mg via INTRAVENOUS
  Filled 2015-12-11: qty 500

## 2015-12-11 MED ORDER — TRANEXAMIC ACID 1000 MG/10ML IV SOLN
INTRAVENOUS | Status: AC
  Filled 2015-12-11: qty 10

## 2015-12-11 MED ORDER — METOCLOPRAMIDE HCL 10 MG PO TABS: 5.0000 mg | ORAL_TABLET | Freq: Three times a day (TID) | ORAL | Status: DC | PRN

## 2015-12-11 MED ORDER — SODIUM CHLORIDE 0.9 % IV SOLN
INTRAVENOUS | Status: DC
  Administered 2015-12-11 (×2): via INTRAVENOUS

## 2015-12-11 MED ORDER — ACETAMINOPHEN 325 MG PO TABS: 650.0000 mg | ORAL_TABLET | Freq: Four times a day (QID) | ORAL | Status: DC | PRN

## 2015-12-11 MED ORDER — CHLORHEXIDINE GLUCONATE CLOTH 2 % EX PADS
6.0000 | MEDICATED_PAD | Freq: Every day | CUTANEOUS | Status: DC
  Administered 2015-12-12 – 2015-12-14 (×3): 6 via TOPICAL

## 2015-12-11 MED ORDER — DIPHENHYDRAMINE HCL 12.5 MG/5ML PO ELIX
12.5000 mg | ORAL_SOLUTION | ORAL | Status: DC | PRN
Start: 2015-12-11 — End: 2015-12-14

## 2015-12-11 MED ORDER — ENOXAPARIN SODIUM 40 MG/0.4ML ~~LOC~~ SOLN
40.0000 mg | SUBCUTANEOUS | Status: DC
  Administered 2015-12-12 – 2015-12-14 (×3): 40 mg via SUBCUTANEOUS
  Filled 2015-12-11 (×3): qty 0.4

## 2015-12-11 MED ORDER — GABAPENTIN 300 MG PO CAPS
300.0000 mg | ORAL_CAPSULE | Freq: Every day | ORAL | Status: DC
  Administered 2015-12-11 – 2015-12-13 (×3): 300 mg via ORAL
  Filled 2015-12-11 (×3): qty 1

## 2015-12-11 MED ORDER — IMIPRAMINE HCL 25 MG PO TABS
25.0000 mg | ORAL_TABLET | Freq: Every day | ORAL | Status: DC
  Administered 2015-12-11 – 2015-12-14 (×4): 25 mg via ORAL
  Filled 2015-12-11 (×6): qty 1

## 2015-12-11 MED ORDER — BUPIVACAINE HCL (PF) 0.5 % IJ SOLN
INTRAMUSCULAR | Status: DC | PRN
  Administered 2015-12-11: 3 mL

## 2015-12-11 MED ORDER — SODIUM CHLORIDE 0.9 % IV BOLUS (SEPSIS)
250.0000 mL | Freq: Once | INTRAVENOUS | Status: AC
  Administered 2015-12-11: 250 mL via INTRAVENOUS

## 2015-12-11 MED ORDER — OXYCODONE HCL 5 MG PO TABS
5.0000 mg | ORAL_TABLET | ORAL | Status: DC | PRN
  Administered 2015-12-11 (×2): 5 mg via ORAL
  Administered 2015-12-12 (×3): 10 mg via ORAL
  Administered 2015-12-12: 5 mg via ORAL
  Administered 2015-12-12: 10 mg via ORAL
  Administered 2015-12-13 – 2015-12-14 (×3): 5 mg via ORAL
  Filled 2015-12-11: qty 2
  Filled 2015-12-11: qty 1
  Filled 2015-12-11: qty 2
  Filled 2015-12-11: qty 1
  Filled 2015-12-11: qty 2
  Filled 2015-12-11 (×4): qty 1
  Filled 2015-12-11: qty 2

## 2015-12-11 MED ORDER — ACETAMINOPHEN 650 MG RE SUPP: 650.0000 mg | Freq: Four times a day (QID) | RECTAL | Status: DC | PRN

## 2015-12-11 MED ORDER — MAGNESIUM CITRATE PO SOLN
1.0000 | Freq: Once | ORAL | Status: DC | PRN
  Filled 2015-12-11: qty 296

## 2015-12-11 MED ORDER — CEFAZOLIN SODIUM-DEXTROSE 2-4 GM/100ML-% IV SOLN
INTRAVENOUS | Status: AC
  Filled 2015-12-11: qty 100

## 2015-12-11 MED ORDER — FENTANYL CITRATE (PF) 100 MCG/2ML IJ SOLN
25.0000 ug | INTRAMUSCULAR | Status: DC | PRN
  Administered 2015-12-11 (×3): 25 ug via INTRAVENOUS

## 2015-12-11 MED ORDER — POTASSIUM CHLORIDE IN NACL 20-0.9 MEQ/L-% IV SOLN
INTRAVENOUS | Status: DC
  Administered 2015-12-11 – 2015-12-12 (×2): via INTRAVENOUS
  Filled 2015-12-11 (×10): qty 1000

## 2015-12-11 MED ORDER — FUROSEMIDE 20 MG PO TABS
20.0000 mg | ORAL_TABLET | Freq: Every day | ORAL | Status: DC
  Administered 2015-12-11 – 2015-12-14 (×3): 20 mg via ORAL
  Filled 2015-12-11 (×3): qty 1

## 2015-12-11 MED ORDER — ONDANSETRON HCL 4 MG/2ML IJ SOLN
INTRAMUSCULAR | Status: DC | PRN
  Administered 2015-12-11: 4 mg via INTRAVENOUS

## 2015-12-11 MED ORDER — EPHEDRINE SULFATE 50 MG/ML IJ SOLN
INTRAMUSCULAR | Status: DC | PRN
  Administered 2015-12-11: 10 mg via INTRAVENOUS
  Administered 2015-12-11 (×2): 5 mg via INTRAVENOUS
  Administered 2015-12-11: 10 mg via INTRAVENOUS

## 2015-12-11 MED ORDER — PROPOFOL 500 MG/50ML IV EMUL
INTRAVENOUS | Status: DC | PRN
  Administered 2015-12-11: 50 ug/kg/min via INTRAVENOUS

## 2015-12-11 MED ORDER — HYDROCHLOROTHIAZIDE 25 MG PO TABS
12.5000 mg | ORAL_TABLET | Freq: Every day | ORAL | Status: DC
  Administered 2015-12-14: 12.5 mg via ORAL
  Filled 2015-12-11 (×2): qty 1

## 2015-12-11 MED ORDER — LATANOPROST 0.005 % OP SOLN
1.0000 [drp] | Freq: Every day | OPHTHALMIC | Status: DC
  Administered 2015-12-11: 1 [drp] via OPHTHALMIC
  Filled 2015-12-11: qty 2.5

## 2015-12-11 MED ORDER — PHENYLEPHRINE HCL 10 MG/ML IJ SOLN
INTRAMUSCULAR | Status: DC | PRN
  Administered 2015-12-11 (×5): 100 ug via INTRAVENOUS
  Administered 2015-12-11: 200 ug via INTRAVENOUS

## 2015-12-11 MED ORDER — PHENOL 1.4 % MT LIQD
1.0000 | OROMUCOSAL | Status: DC | PRN
  Filled 2015-12-11: qty 177

## 2015-12-11 MED ORDER — FAMOTIDINE 20 MG PO TABS
20.0000 mg | ORAL_TABLET | Freq: Once | ORAL | Status: AC
  Administered 2015-12-11: 20 mg via ORAL

## 2015-12-11 MED ORDER — SODIUM CHLORIDE 0.9 % IV SOLN
10000.0000 ug | INTRAVENOUS | Status: DC | PRN
  Administered 2015-12-11: 50 ug/min via INTRAVENOUS

## 2015-12-11 MED ORDER — FAMOTIDINE 20 MG PO TABS
ORAL_TABLET | ORAL | Status: AC
Start: 2015-12-11 — End: 2015-12-11
  Filled 2015-12-11: qty 1

## 2015-12-11 MED ORDER — MIDAZOLAM HCL 5 MG/5ML IJ SOLN
INTRAMUSCULAR | Status: DC | PRN
  Administered 2015-12-11: 2 mg via INTRAVENOUS

## 2015-12-11 MED ORDER — CALCIUM CARBONATE-VITAMIN D 500-200 MG-UNIT PO TABS
1.0000 | ORAL_TABLET | Freq: Two times a day (BID) | ORAL | Status: DC
  Administered 2015-12-11 – 2015-12-14 (×6): 1 via ORAL
  Filled 2015-12-11 (×6): qty 1

## 2015-12-11 MED ORDER — CEFAZOLIN SODIUM-DEXTROSE 2-4 GM/100ML-% IV SOLN
2.0000 g | Freq: Once | INTRAVENOUS | Status: AC
  Administered 2015-12-11: 2 g via INTRAVENOUS

## 2015-12-11 MED ORDER — GLYCOPYRROLATE 0.2 MG/ML IJ SOLN
INTRAMUSCULAR | Status: DC | PRN
Start: 2015-12-11 — End: 2015-12-11
  Administered 2015-12-11: 0.2 mg via INTRAVENOUS

## 2015-12-11 MED ORDER — BUPIVACAINE-EPINEPHRINE 0.25% -1:200000 IJ SOLN
INTRAMUSCULAR | Status: DC | PRN
  Administered 2015-12-11: 30 mL

## 2015-12-11 MED ORDER — TRANEXAMIC ACID 1000 MG/10ML IV SOLN
1000.0000 mg | INTRAVENOUS | Status: DC | PRN
  Administered 2015-12-11: 1000 mg via TOPICAL

## 2015-12-11 MED ORDER — BUPIVACAINE-EPINEPHRINE (PF) 0.25% -1:200000 IJ SOLN
INTRAMUSCULAR | Status: AC
  Filled 2015-12-11: qty 30

## 2015-12-11 SURGICAL SUPPLY — 55 items
BLADE SAW SAG 18.5X105 (BLADE) ×2 IMPLANT
BNDG COHESIVE 6X5 TAN STRL LF (GAUZE/BANDAGES/DRESSINGS) ×2 IMPLANT
CANISTER SUCT 1200ML W/VALVE (MISCELLANEOUS) IMPLANT
CANISTER SUCT 3000ML (MISCELLANEOUS) ×2 IMPLANT
CAPT HIP TOTAL 3 ×2 IMPLANT
CATH FOL LEG HOLDER (MISCELLANEOUS) ×2 IMPLANT
CATH TRAY METER 16FR LF (MISCELLANEOUS) ×2 IMPLANT
CHLORAPREP W/TINT 26ML (MISCELLANEOUS) ×4 IMPLANT
CUP MEDICINE 2OZ PLAST GRAD ST (MISCELLANEOUS) ×2 IMPLANT
DECANTER SPIKE VIAL GLASS SM (MISCELLANEOUS) ×2 IMPLANT
DRAPE C-ARM XRAY 36X54 (DRAPES) ×2 IMPLANT
DRAPE INCISE IOBAN 66X60 STRL (DRAPES) IMPLANT
DRAPE POUCH INSTRU U-SHP 10X18 (DRAPES) ×2 IMPLANT
DRAPE SHEET LG 3/4 BI-LAMINATE (DRAPES) ×6 IMPLANT
DRAPE STERI IOBAN 125X83 (DRAPES) ×2 IMPLANT
DRAPE TABLE BACK 80X90 (DRAPES) ×2 IMPLANT
DRSG OPSITE POSTOP 4X10 (GAUZE/BANDAGES/DRESSINGS) ×2 IMPLANT
DRSG OPSITE POSTOP 4X8 (GAUZE/BANDAGES/DRESSINGS) IMPLANT
ELECT BLADE 6.5 EXT (BLADE) ×2 IMPLANT
GAUZE SPONGE 4X4 12PLY STRL (GAUZE/BANDAGES/DRESSINGS) IMPLANT
GLOVE BIO SURGEON STRL SZ7 (GLOVE) ×2 IMPLANT
GLOVE BIOGEL PI IND STRL 9 (GLOVE) ×1 IMPLANT
GLOVE BIOGEL PI INDICATOR 9 (GLOVE) ×1
GLOVE INDICATOR 7.5 STRL GRN (GLOVE) ×2 IMPLANT
GLOVE SURG ORTHO 9.0 STRL STRW (GLOVE) ×4 IMPLANT
GOWN L4 XLG 20 PK N/S (GOWN DISPOSABLE) ×2 IMPLANT
GOWN STRL REUS W/ TWL LRG LVL3 (GOWN DISPOSABLE) ×1 IMPLANT
GOWN STRL REUS W/TWL LRG LVL3 (GOWN DISPOSABLE) ×1
GOWN SURG XXL (GOWNS) ×2 IMPLANT
HEMOVAC 400CC 10FR (MISCELLANEOUS) ×2 IMPLANT
HOOD PEEL AWAY FLYTE STAYCOOL (MISCELLANEOUS) ×2 IMPLANT
MAT BLUE FLOOR 46X72 FLO (MISCELLANEOUS) ×2 IMPLANT
NDL SAFETY 18GX1.5 (NEEDLE) ×2 IMPLANT
NEEDLE FILTER BLUNT 18X 1/2SAF (NEEDLE) ×1
NEEDLE FILTER BLUNT 18X1 1/2 (NEEDLE) ×1 IMPLANT
NEEDLE SPNL 18GX3.5 QUINCKE PK (NEEDLE) ×2 IMPLANT
NS IRRIG 1000ML POUR BTL (IV SOLUTION) ×2 IMPLANT
PACK HIP COMPR (MISCELLANEOUS) ×2 IMPLANT
SOL PREP PVP 2OZ (MISCELLANEOUS) ×2
SOLUTION PREP PVP 2OZ (MISCELLANEOUS) ×1 IMPLANT
SPONGE DRAIN TRACH 4X4 STRL 2S (GAUZE/BANDAGES/DRESSINGS) ×2 IMPLANT
STAPLER SKIN PROX 35W (STAPLE) ×2 IMPLANT
STRAP SAFETY BODY (MISCELLANEOUS) ×4 IMPLANT
SUT DVC 2 QUILL PDO  T11 36X36 (SUTURE) ×1
SUT DVC 2 QUILL PDO T11 36X36 (SUTURE) ×1 IMPLANT
SUT DVC QUILL MONODERM 30X30 (SUTURE) ×2 IMPLANT
SUT SILK 0 (SUTURE) ×1
SUT SILK 0 30XBRD TIE 6 (SUTURE) ×1 IMPLANT
SUT VIC AB 1 CT1 36 (SUTURE) ×2 IMPLANT
SYR 20CC LL (SYRINGE) ×2 IMPLANT
SYR 30ML LL (SYRINGE) ×2 IMPLANT
SYRINGE 10CC LL (SYRINGE) ×2 IMPLANT
TAPE MICROFOAM 4IN (TAPE) IMPLANT
TOWEL OR 17X26 4PK STRL BLUE (TOWEL DISPOSABLE) IMPLANT
TUBE KAMVAC SUCTION (TUBING) ×2 IMPLANT

## 2015-12-11 NOTE — H&P (Signed)
Reviewed paper H+P, will be scanned into chart. No changes noted.  

## 2015-12-11 NOTE — Progress Notes (Signed)
Blood pressure  92/48. Dr Roland Rack notified. Received order to administer 250 NS bolus over 20 minutes

## 2015-12-11 NOTE — Anesthesia Procedure Notes (Signed)
Spinal Patient location during procedure: OR Start time: 12/11/2015 2:15 PM End time: 12/11/2015 2:22 PM Staffing Anesthesiologist: Marline Backbone F Performed by: anesthesiologist  Preanesthetic Checklist Completed: patient identified, site marked, surgical consent, pre-op evaluation, timeout performed, IV checked, risks and benefits discussed and monitors and equipment checked Spinal Block Patient position: sitting Prep: Betadine Patient monitoring: heart rate and blood pressure Approach: right paramedian Location: L3-4 Injection technique: single-shot Needle Needle type: Tuohy  Needle gauge: 22 G Needle length: 9 cm Needle insertion depth: 7 cm Assessment Sensory level: T8

## 2015-12-11 NOTE — Progress Notes (Signed)
AP/lat xray left hip done.

## 2015-12-11 NOTE — Progress Notes (Signed)
Foot pumps applied. Ice pack to left hip.

## 2015-12-11 NOTE — Transfer of Care (Signed)
Immediate Anesthesia Transfer of Care Note  Patient: Becky Trevino  Procedure(s) Performed: Procedure(s): TOTAL HIP ARTHROPLASTY ANTERIOR APPROACH (Left)  Patient Location: PACU  Anesthesia Type:Spinal  Level of Consciousness: awake  Airway & Oxygen Therapy: Patient Spontanous Breathing and Patient connected to face mask oxygen  Post-op Assessment: Report given to RN and Post -op Vital signs reviewed and stable  Post vital signs: stable  Last Vitals:  Filed Vitals:   12/11/15 1130 12/11/15 1641  BP: 131/68 97/61  Pulse: 73 73  Temp:  36.4 C  Resp: 16 12    Complications: No apparent anesthesia complications

## 2015-12-11 NOTE — Progress Notes (Signed)
Dr Kayleen Memos notified of fingerstick bloodsugar of 74. No new orders given.

## 2015-12-11 NOTE — Anesthesia Preprocedure Evaluation (Signed)
Anesthesia Evaluation  Patient identified by MRN, date of birth, ID band Patient awake    Reviewed: Allergy & Precautions, NPO status , Patient's Chart, lab work & pertinent test results  Airway Mallampati: III       Dental  (+) Upper Dentures, Lower Dentures   Pulmonary sleep apnea ,     + decreased breath sounds      Cardiovascular Exercise Tolerance: Poor hypertension, Pt. on medications and Pt. on home beta blockers + Peripheral Vascular Disease   Rhythm:Regular Rate:Normal     Neuro/Psych    GI/Hepatic negative GI ROS,   Endo/Other  diabetes, Well Controlled, Type 2, Oral Hypoglycemic Agents  Renal/GU      Musculoskeletal   Abdominal (+) + obese,   Peds  Hematology   Anesthesia Other Findings   Reproductive/Obstetrics                             Anesthesia Physical Anesthesia Plan  ASA: III  Anesthesia Plan: Spinal   Post-op Pain Management:    Induction:   Airway Management Planned: Natural Airway and Nasal Cannula  Additional Equipment:   Intra-op Plan:   Post-operative Plan:   Informed Consent: I have reviewed the patients History and Physical, chart, labs and discussed the procedure including the risks, benefits and alternatives for the proposed anesthesia with the patient or authorized representative who has indicated his/her understanding and acceptance.     Plan Discussed with: CRNA  Anesthesia Plan Comments:         Anesthesia Quick Evaluation

## 2015-12-11 NOTE — Op Note (Signed)
12/11/2015  4:40 PM  PATIENT:  Becky Trevino  70 y.o. female  PRE-OPERATIVE DIAGNOSIS:  primary osteoarthritis left hip  POST-OPERATIVE DIAGNOSIS:  primary osteoarthritis left hip  PROCEDURE:  Procedure(s): TOTAL HIP ARTHROPLASTY ANTERIOR APPROACH (Left)  SURGEON: Laurene Footman, MD  ASSISTANTS: None  ANESTHESIA:   spinal  EBL:  Total I/O In: 1000 [I.V.:1000] Out: 1200 [Urine:700; Blood:500]  BLOOD ADMINISTERED:none  DRAINS: (2) Hemovact drain(s) in the Subcutaneous layer with  Suction Open   LOCAL MEDICATIONS USED:  MARCAINE     SPECIMEN:  Source of Specimen:  Femoral head  DISPOSITION OF SPECIMEN:  PATHOLOGY  COUNTS:  YES  TOURNIQUET:  * No tourniquets in log *  IMPLANTS: Medacta AMIS 3 STEM with 54 mm Mpact cup DM with liner and S 28 mm head  DICTATION: .Dragon Dictation   The patient was brought to the operating room and after spinal anesthesia was obtained patient was placed on the operative table with the ipsilateral foot into the Medacta attachment, contralateral leg on a well-padded table. C-arm was brought in and preop template x-ray taken. After prepping and draping in usual sterile fashion appropriate patient identification and timeout procedures were completed. Anterior approach to the hip was obtained and centered over the greater trochanter and TFL muscle. The subcutaneous tissue was incised hemostasis being achieved by electrocautery. TFL fascia was incised and the muscle retracted laterally deep retractor placed. The lateral femoral circumflex vessels were identified and ligated. The anterior capsule was exposed and a capsulotomy performed. The neck was identified and a femoral neck cut carried out with a saw, with a second cut above this to take out a napkin ring layer of the neck and give better access to the head in a protrusio acetabulum. The head was removed without difficulty and showed sclerotic femoral head and acetabulum. Reaming was carried out to 54  mm and a 54 mm cup trial gave appropriate tightness to the acetabular component. After obtaining bone graft from the removed femoral head this was packed into the acetabular defect medially and the a 54 Mpact cup was impacted into position. The leg was then externally rotated and ischiofemoral and Pupofemoral releases carried out. The femur was sequentially broached to a size3, siz3 stem and S headtrials were placed and the final components chosen. Thestandard AMIS  stem was inserted along with a S  28 mm head and 54  mm liner. The hip was reduced and was stable the wound was thoroughly irrigated. The deep fascia was closed. Using a heavy Quill after infiltration of 30 cc of quarter percent Sensorcaine with epinephrine. additionally TXA was injected into the hip joint to minimize postoperative bleeding.  Subcutaneous drains were then inserted,  2-0 Quill to close the skin with skin staples Xeroform  and honeycomb dressing applied   PLAN OF CARE: Admit to inpatient

## 2015-12-11 NOTE — Progress Notes (Signed)
Awake. Dentures returned to pt. Eyeglasses and belongings on bed with pt.

## 2015-12-12 ENCOUNTER — Encounter: Payer: Self-pay | Admitting: Orthopedic Surgery

## 2015-12-12 LAB — GLUCOSE, CAPILLARY
Glucose-Capillary: 90 mg/dL (ref 65–99)
Glucose-Capillary: 100 mg/dL — ABNORMAL HIGH (ref 65–99)
Glucose-Capillary: 103 mg/dL — ABNORMAL HIGH (ref 65–99)
Glucose-Capillary: 94 mg/dL (ref 65–99)

## 2015-12-12 MED ORDER — SODIUM CHLORIDE 0.9 % IV BOLUS (SEPSIS)
1000.0000 mL | Freq: Once | INTRAVENOUS | Status: AC
  Administered 2015-12-12: 1000 mL via INTRAVENOUS

## 2015-12-12 MED ORDER — SODIUM CHLORIDE 0.9 % IV BOLUS (SEPSIS)
500.0000 mL | Freq: Once | INTRAVENOUS | Status: AC
  Administered 2015-12-12: 500 mL via INTRAVENOUS

## 2015-12-12 NOTE — Progress Notes (Signed)
Pts bladder scan 186 ml, PA Rachelle Hora notified.  Orders received to In and Out x1 if scan is greater than 400 ml. Order placed.

## 2015-12-12 NOTE — Progress Notes (Signed)
Physical Therapy Treatment Patient Details Name: Becky Trevino MRN: [id number] DOB: May 15, 1946 Today's Date: 12/12/2015    History of Present Illness 69 yo F s/p L anterior THR with PMH of DM, HTN, CKD, OA, obesity, apnea, and H LD.    PT Comments    Pt demonstrated gradual progress towards functional goals with improving transfers and ambulation. BP improved this afternoon and was 130/94, SpO2 99% 2L and HR 96. Pt required min A to min guard for transfers with FWW. She ambulated up to 140 ft with FWW and min guard, until her arms became fatigued from pushing down on FWW and needed to sit and rest. Demonstrated slow but steady gait with no LOB. Required increased time to complete tasks due to fatigue and need for frequent therapeutic rest breaks. She will benefit from continued skilled PT to increase functional mobility and progress to PLOF.   Follow Up Recommendations  Home health PT;Supervision for mobility/OOB     Equipment Recommendations  None recommended by PT;Other (comment)    Recommendations for Other Services       Precautions / Restrictions Precautions Precautions: Anterior Hip Restrictions Weight Bearing Restrictions: Yes LLE Weight Bearing: Weight bearing as tolerated    Mobility  Bed Mobility Overal bed mobility: Needs Assistance Bed Mobility: Supine to Sit     Supine to sit: Min assist;HOB elevated     General bed mobility comments: up in recliner, NT  Transfers Overall transfer level: Needs assistance Equipment used: Rolling walker (2 wheeled) Transfers: Sit to/from [facility] Sit to Stand: Min assist;+2 safety/equipment (for IV pole and O2) Stand pivot transfers: Min assist;+2 safety/equipment (for IV pole and O2)       General transfer comment: cues for hand placement, anterior weight shifting and power technique of LEs to assist in standing from lower surface.  Ambulation/Gait Ambulation/Gait assistance: Min guard Ambulation  Distance (Feet): 140 Feet Assistive device: Rolling walker (2 wheeled) Gait Pattern/deviations: Step-to pattern;Antalgic;Trunk flexed;Narrow base of support Gait velocity: reduced   General Gait Details: Pt demonstrated slow but steady gait with no LOB. Pt becomes fatigues in her UEs from pushing down on FWW. Cues provided for heel to toe, step through pattern and postural correction to improve smoothness of gait and reduce workload for UEs.    Stairs            Wheelchair Mobility    Modified Rankin (Stroke Patients Only)       Balance Overall balance assessment: Needs assistance Sitting-balance support: No upper extremity supported Sitting balance-Leahy Scale: Good     Standing balance support: Single extremity supported Standing balance-Leahy Scale: Fair Standing balance comment: maintains balance after initial stand with weight shift correcting                    Cognition Arousal/Alertness: Awake/alert Behavior During Therapy: WFL for tasks assessed/performed Overall Cognitive Status: Within Functional Limits for tasks assessed                      Exercises Other Exercises Other Exercises: Stand pivot transfers to/from recliner and BSC with min A to min guard with FWW and cues for hand placement, technique and safety. No LOB during session. Other Exercises: Pt ambulated 140 ft with FWW and min guard with +2 assistance for equipment/safety. Pt is limited in ambulation due to UE fatigue from pushing down on FWW.     General Comments General comments (skin integrity, edema, etc.): hemovac  Pertinent Vitals/Pain Pain Assessment: 0-10 Pain Score: 6  Pain Location: L hip Pain Descriptors / Indicators: Aching Pain Intervention(s): Limited activity within patient's tolerance;Monitored during session;Premedicated before session    Home Living Family/patient expects to be discharged to:: Private residence Living Arrangements: Spouse/significant  other Available Help at Discharge: Family Type of Home: House Home Access: Level entry   Home Layout: One level Home Equipment: Environmental consultant - 2 wheels;Cane - single point      Prior Function Level of Independence: Independent      Comments: I with ADLs, ambulation and driving   PT Goals (current goals can now be found in the care plan section) Acute Rehab PT Goals Patient Stated Goal: To get back to doing what she was doing prior to surgery PT Goal Formulation: With patient/family Time For Goal Achievement: 12/26/15 Potential to Achieve Goals: Good Progress towards PT goals: Progressing toward goals    Frequency  BID    PT Plan Current plan remains appropriate    Co-evaluation             End of Session Equipment Utilized During Treatment: Gait belt;Oxygen Activity Tolerance: Patient tolerated treatment well;Patient limited by fatigue Patient left: in chair;with call bell/phone within reach;with chair alarm set;with family/visitor present;with SCD's reapplied     Time: 1335-1413 PT Time Calculation (min) (ACUTE ONLY): 38 min  Charges:  $Gait Training: 23-37 mins $Therapeutic Exercise: 8-22 mins $Therapeutic Activity: 8-22 mins                    G Codes:      Neoma Laming, PT, DPT  12/12/2015, 3:14 PM [phone]

## 2015-12-12 NOTE — Progress Notes (Signed)
Patient was unable to void. Notifed [place], [place] and orders for In and out Cath given x1. Will continue to follow.

## 2015-12-12 NOTE — Anesthesia Postprocedure Evaluation (Signed)
Anesthesia Post Note  Patient: Becky Trevino  Procedure(s) Performed: Procedure(s) (LRB): TOTAL HIP ARTHROPLASTY ANTERIOR APPROACH (Left)  Patient location during evaluation: Other Anesthesia Type: Spinal Level of consciousness: oriented and awake and alert Pain management: pain level controlled Vital Signs Assessment: post-procedure vital signs reviewed and stable Respiratory status: spontaneous breathing, respiratory function stable and patient connected to nasal cannula oxygen Cardiovascular status: blood pressure returned to baseline and stable Postop Assessment: no headache and no backache Anesthetic complications: no    Last Vitals:  Filed Vitals:   12/11/15 2246 12/12/15 0418  BP: 133/72 115/62  Pulse: 69 77  Temp: 37 C 37.2 C  Resp: 19 19    Last Pain:  Filed Vitals:   12/12/15 [id number]  PainSc: Asleep                 Alison Stalling

## 2015-12-12 NOTE — Care Management Note (Addendum)
Case Management Note  Patient Details  Name: Becky Trevino MRN: [id number] Date of Birth: 01/13/1946  Subjective/Objective:                  Met with patient and her spouse to discuss discharge planning. She agrees to SNF or HHPT and would like to use [facility] like before. She has her walker present at this bedside. She denies need for bedside commode. Her husband agrees to caring for her in the home. [facility] uses [facility]  [phone].  Address: [address] [place] [address].  Action/Plan: List of home health agencies left with patient. Referral made to [facility] Lovenox 46m #14 called in to [facility].   Expected Discharge Date:                  Expected Discharge Plan:     In-House Referral:     Discharge planning Services  CM Consult  Post Acute Care Choice:  Home Health Choice offered to:  Patient, Spouse  DME Arranged:    DME Agency:     HH Arranged:  PT HH Agency:  [facility] Status of Service:  In process, will continue to follow  Medicare Important Message Given:    Date Medicare IM Given:    Medicare IM give by:    Date Additional Medicare IM Given:    Additional Medicare Important Message give by:     If discussed at [place] Stay Meetings, dates discussed:    Additional Comments: Lovenox $93.58. Patient notified and agrees.   AMarshell Garfinkel RN 12/12/2015, 8:50 AM

## 2015-12-12 NOTE — Progress Notes (Signed)
Clinical Social Worker (CSW) received SNF consult. PT is recommending home health. RN Case Manager is aware of above. Please reconsult if future social work needs arise. CSW signing off.   Tyshika Baldridge Morgan, LCSW [phone] 

## 2015-12-12 NOTE — Evaluation (Signed)
Physical Therapy Evaluation Patient Details Name: Becky Trevino MRN: [id number] DOB: [birth year] Today's Date: 12/12/2015   History of Present Illness  70 yo F s/p L anterior THR with PMH of DM, HTN, CKD, OA, obesity, apnea, and H LD.  Clinical Impression  Pt demonstrated generalized weakness and difficulty walking after L THR. She is somewhat limited by pain and fatigue. She performs bed mobility with min A assist as well as transfers and ambulation of 8 ft with FWW. She requires cues for hand placement and safe technique during mobility tasks. Increased time to complete tasks due to fatigue and monitoring vitals frequently. Supine BP 104/65 with HR 79 and SpO2 99% on 2L. Seated BP 107/60. Pt has no c/o dizziness. RN administered IV bolus during session. There are no discharge barriers as she has her husband to assist her and her home is level entry. HHPT is recommended to continue to progress toward PLOF, increasing strength, balance, endurance and ambulation, as well as assess home safety. Pt will benefit from skilled PT services to increase functional I and mobility for safe discharge.     Follow Up Recommendations Home health PT;Supervision for mobility/OOB    Equipment Recommendations  None recommended by PT;Other (comment) (pt has recommended FWW)    Recommendations for Other Services       Precautions / Restrictions Precautions Precautions: Anterior Hip Restrictions Weight Bearing Restrictions: Yes LLE Weight Bearing: Weight bearing as tolerated      Mobility  Bed Mobility Overal bed mobility: Needs Assistance Bed Mobility: Supine to Sit     Supine to sit: Min assist;HOB elevated     General bed mobility comments: uses rails, assistance for L LE  Transfers Overall transfer level: Needs assistance Equipment used: Rolling walker (2 wheeled) Transfers: Sit to/from [facility] Sit to Stand: Min assist;+2 safety/equipment;From elevated surface Stand  pivot transfers: Min assist;+2 safety/equipment       General transfer comment: cues for hand placement  Ambulation/Gait Ambulation/Gait assistance: Min guard Ambulation Distance (Feet): 8 Feet Assistive device: Rolling walker (2 wheeled) Gait Pattern/deviations: Step-to pattern;Antalgic Gait velocity: reduced   General Gait Details: limited distance due to pt's low BP, cues for staying inside [facility]  Stairs            Wheelchair Mobility    Modified Rankin (Stroke Patients Only)       Balance Overall balance assessment: Needs assistance Sitting-balance support: Feet supported Sitting balance-Leahy Scale: Good     Standing balance support: Bilateral upper extremity supported Standing balance-Leahy Scale: Fair Standing balance comment: cues for postural correction, steady with no LOB                             Pertinent Vitals/Pain Pain Assessment: 0-10 Pain Score: 7  Pain Location: L hip Pain Descriptors / Indicators: Aching Pain Intervention(s): Limited activity within patient's tolerance;Monitored during session;Premedicated before session    Home Living Family/patient expects to be discharged to:: Private residence Living Arrangements: Spouse/significant other Available Help at Discharge: Family Type of Home: House Home Access: Level entry     Home Layout: One level Home Equipment: Environmental consultant - 2 wheels;Cane - single point      Prior Function Level of Independence: Independent         Comments: I with ADLs, ambulation and driving     Hand Dominance   Dominant Hand: Right    Extremity/Trunk Assessment   Upper Extremity Assessment: Overall [facility]  for tasks assessed           Lower Extremity Assessment: Overall WFL for tasks assessed         Communication   Communication: No difficulties  Cognition Arousal/Alertness: Awake/alert Behavior During Therapy: WFL for tasks assessed/performed Overall Cognitive Status: Within  Functional Limits for tasks assessed                      General Comments General comments (skin integrity, edema, etc.): hemovac    Exercises Other Exercises Other Exercises: L LE seated therex: LAQs, marching, hip add squeezes, hip abd with manual resistance x10 each. Cues for proper technique.  Other Exercises: Standing dynamic/static balance with reaching out of BOS x5 minutes with min guard and cues for postural correction. Steady with no LOB.       Assessment/Plan    PT Assessment Patient needs continued PT services  PT Diagnosis Difficulty walking;Generalized weakness   PT Problem List Decreased strength;Decreased activity tolerance;Decreased balance;Decreased mobility;Obesity;Pain  PT Treatment Interventions Gait training;Therapeutic activities;Therapeutic exercise;Balance training;Neuromuscular re-education;Patient/family education   PT Goals (Current goals can be found in the Care Plan section) Acute Rehab PT Goals Patient Stated Goal: To get back to doing what she was doing prior to surgery PT Goal Formulation: With patient/family Time For Goal Achievement: 12/26/15 Potential to Achieve Goals: Good    Frequency BID   Barriers to discharge   none    Co-evaluation               End of Session Equipment Utilized During Treatment: Gait belt;Oxygen Activity Tolerance: Patient tolerated treatment well Patient left: in chair;with call bell/phone within reach;with chair alarm set;with family/visitor present;with SCD's reapplied Nurse Communication: Mobility status         Time: [id number] PT Time Calculation (min) (ACUTE ONLY): 38 min   Charges:   PT Evaluation $PT Eval Moderate Complexity: 1 Procedure PT Treatments $Therapeutic Exercise: 8-22 mins $Therapeutic Activity: 8-22 mins   PT G Codes:        Neoma Laming, PT, DPT  12/12/2015, 11:21 AM [phone]

## 2015-12-12 NOTE — Progress Notes (Signed)
   Subjective: 1 Day Post-Op Procedure(s) (LRB): TOTAL HIP ARTHROPLASTY ANTERIOR APPROACH (Left) Patient reports pain as 7 on 0-10 scale.   Patient is well, and has had no acute complaints or problems Denies any CP, SOB, ABD pain. We will continue therapy today.    Objective: Vital signs in last 24 hours: Temp:  [96.3 F (35.7 C)-99 F (37.2 C)] 98.9 F (37.2 C) (03/29 0418) Pulse Rate:  [60-77] 77 (03/29 0418) Resp:  [10-19] 19 (03/29 0418) BP: (92-133)/(48-76) 115/62 mmHg (03/29 0418) SpO2:  [92 %-100 %] 100 % (03/29 0418) Weight:  [114.488 kg (252 lb 6.4 oz)] 114.488 kg (252 lb 6.4 oz) (03/28 1830)  Intake/Output from previous day: 03/28 0701 - 03/29 0700 In: 1500 [I.V.:1500] Out: 2350 [Urine:1850; Blood:500] Intake/Output this shift:     Recent Labs  12/11/15 1920  HGB 12.2    Recent Labs  12/11/15 1920  WBC 9.8  RBC 3.78*  HCT 36.3  PLT 171    Recent Labs  12/11/15 1920  CREATININE 1.16*   No results for input(s): LABPT, INR in the last 72 hours.  EXAM General - Patient is Alert, Appropriate and Oriented Extremity - Neurovascular intact Sensation intact distally Intact pulses distally Dorsiflexion/Plantar flexion intact Dressing - dressing C/D/I, no drainage and hemovac intact Motor Function - intact, moving foot and toes well on exam.   Past Medical History  Diagnosis Date  . Hyperlipidemia   . Hypertension   . Diabetes mellitus without complication ([place])   . Obesity   . Leg varices   . Chronic kidney disease     Stage 3  . Intertrigo   . DDD (degenerative disc disease), cervical     [facility]  . Symptomatic menopausal or female climacteric states   . Cervical spondylosis   . OSA (obstructive sleep apnea)   . Leg cramps     Assessment/Plan:   1 Day Post-Op Procedure(s) (LRB): TOTAL HIP ARTHROPLASTY ANTERIOR APPROACH (Left) Active Problems:   Primary osteoarthritis of left hip  Estimated body mass index is 35.22  kg/(m^2) as calculated from the following:   Height as of this encounter: 5\' 11"  (1.803 m).   Weight as of this encounter: 114.488 kg (252 lb 6.4 oz). Advance diet Up with therapy  Needs BM Recheck labs in the am Will monitor pain, if not improving, recommend adding tramadol to pain regimen  DVT Prophylaxis - Lovenox, Foot Pumps and TED hose Weight-Bearing as tolerated to left leg D/C O2 and Pulse OX and try on Room Air  T. Rachelle Hora, PA-C [place] 12/12/2015, 7:54 AM

## 2015-12-12 NOTE — Evaluation (Signed)
Occupational Therapy Evaluation Patient Details Name: TAMBREY DONATE MRN: [id number] DOB: 1946/08/25 Today's Date: 12/12/2015    History of Present Illness     Pt. Is a 70 y.o. Female who was admitted for a Left THR secondary to Primary Osteoarthritis.              Clinical Impression    Pt. is 70 year old s/p Left THR(Anterior Approach)  who lives at home with her husband. Pt was independent in all ADLs prior to surgery, and was working. Pt. is eager to return to her PLOF.  Pt is currently limited in functional ADLs due to pait n, decreased ROM, nausea and vomiting.  Pt requires min assist for LB ADLs due to pain and decreased AROM of the left LE.  Pt. would benefit from continued skilled OT services for education in assistive devices, functional mobility, and education in recommendations for home modifications to increase safety and prevent falls.  Pt. Could benefit from continued OT services in order to  Review A/E use, and to improve ADL and IADL functioning.           Follow Up Recommendations  Home health OT    Equipment Recommendations       Recommendations for Other Services PT consult     Precautions / Restrictions Precautions Precautions: Anterior Hip Restrictions Weight Bearing Restrictions: Yes LLE Weight Bearing: Weight bearing as tolerated      Mobility Bed Mobility    Pt. up in chair upon arrival.                Transfers    Pt. performed sit to stand with minA with RW.                    Balance  Pt. demonstrated Fair standing balance with walker.                                          ADL Overall ADL's : Modified independent                                       General ADL Comments: Pt. requires Min A LE dressing with A/E use for donning/doffing socks with reacher and sockaide. Pt. requires rest breaks, verbal cues, and increased time to complete. Reviewed Anterior hip precautions with  pt.  Pt. is independent with self-grooming and self-feeding from a seated position.     Vision     Perception     Praxis      Pertinent Vitals/Pain Pain Assessment: 0-10 Pain Score: 7      Hand Dominance Right   Extremity/Trunk Assessment             Communication Communication Communication: No difficulties   Cognition                           General Comments       Exercises       Shoulder Instructions      Home Living Family/patient expects to be discharged to:: Private residence Living Arrangements: Spouse/significant other Available Help at Discharge: Family Type of Home: House Home Access: Level entry     Home Layout: One level     Bathroom Shower/Tub: Tub/shower unit;Curtain  Shower/tub characteristics: Curtain                  Prior Functioning/Environment Level of Independence: Independent             OT Diagnosis: Generalized weakness;Acute pain   OT Problem List: Decreased strength;Decreased activity tolerance;Pain   OT Treatment/Interventions: Self-care/ADL training;Therapeutic activities;Therapeutic exercise;Patient/family education;Energy conservation;DME and/or AE instruction    OT Goals(Current goals can be found in the care plan section) Acute Rehab OT Goals Patient Stated Goal: To get back to doing what she was doing prior to surgery OT Goal Formulation: With patient/family Time For Goal Achievement: 12/26/15 Potential to Achieve Goals: Good  OT Frequency: Min 2X/week   Barriers to D/C:            Co-evaluation              End of Session Equipment Utilized During Treatment: Gait belt;Rolling walker  Activity Tolerance: Patient tolerated treatment well Patient left: in chair;with chair alarm set;with nursing/sitter in room   Time: [id number] OT Time Calculation (min): 33 min Charges:  OT General Charges $OT Visit: 1 Procedure OT Evaluation $OT Eval Moderate Complexity: 1  Procedure G-Codes:    Harrel Carina, MS, OTR/L  Harrel Carina 12/12/2015, 10:41 AM

## 2015-12-12 NOTE — Progress Notes (Signed)
Spoke with Dr.Menz regarding patient's blood pressure 97/69, order received to give bolus 592ml over 1 hr.  Ask MD to get hgb for today- MD states will recheck in am. Will continue to monitor the patient.

## 2015-12-12 NOTE — Progress Notes (Signed)
Dr.Menz here to round, updated with pt's low blood pressure 98/60 and bladder scan result of 186 ml. MD to place new orders.

## 2015-12-13 LAB — CBC
HEMATOCRIT: 31.7 % — AB (ref 35.0–47.0)
Hemoglobin: 10.7 g/dL — ABNORMAL LOW (ref 12.0–16.0)
MCH: 32.5 pg (ref 26.0–34.0)
MCHC: 33.7 g/dL (ref 32.0–36.0)
MCV: 96.2 fL (ref 80.0–100.0)
PLATELETS: 158 10*3/uL (ref 150–440)
RBC: 3.29 MIL/uL — AB (ref 3.80–5.20)
RDW: 15.3 % — AB (ref 11.5–14.5)
WBC: 9.2 10*3/uL (ref 3.6–11.0)

## 2015-12-13 LAB — GLUCOSE, CAPILLARY
GLUCOSE-CAPILLARY: 94 mg/dL (ref 65–99)
GLUCOSE-CAPILLARY: 108 mg/dL — AB (ref 65–99)
GLUCOSE-CAPILLARY: 114 mg/dL — AB (ref 65–99)
Glucose-Capillary: 100 mg/dL — ABNORMAL HIGH (ref 65–99)

## 2015-12-13 LAB — BASIC METABOLIC PANEL
ANION GAP: 4 — AB (ref 5–15)
BUN: 13 mg/dL (ref 6–20)
CALCIUM: 8.1 mg/dL — AB (ref 8.9–10.3)
CO2: 26 mmol/L (ref 22–32)
Chloride: 104 mmol/L (ref 101–111)
Creatinine, Ser: 0.92 mg/dL (ref 0.44–1.00)
Glucose, Bld: 101 mg/dL — ABNORMAL HIGH (ref 65–99)
POTASSIUM: 3.9 mmol/L (ref 3.5–5.1)
Sodium: 134 mmol/L — ABNORMAL LOW (ref 135–145)

## 2015-12-13 MED ORDER — OXYCODONE HCL 5 MG PO TABS
5.0000 mg | ORAL_TABLET | ORAL | Status: DC | PRN
Start: 2015-12-13 — End: 2016-04-23

## 2015-12-13 MED ORDER — ENOXAPARIN SODIUM 40 MG/0.4ML ~~LOC~~ SOLN: 40.0000 mg | SUBCUTANEOUS | Status: DC

## 2015-12-13 NOTE — Progress Notes (Signed)
Physical Therapy Treatment Patient Details Name: Becky Trevino MRN: [id number] DOB: 1946/07/16 Today's Date: 12/13/2015    History of Present Illness 70 yo F s/p L anterior THR with PMH of DM, HTN, CKD, OA, obesity, apnea, and H LD.    PT Comments    Pt continues to progress towards functional goals. She is off of O2 upon PT arrival. Resting BP 98/55, HR 96 and SpO2 94% RA. Pt performed bed mobility with min guard to assist with L LE. Transfers required min A to min guard with FWW. She has difficulty standing from lower surfaces. Pt increased ambulation up to 220 ft with FWW and min guard. Cues provided for safety and pursed lip breathing. No c/o dizziness or increased pain during session. She will benefit from continued skilled PT to increase functional mobility and progress towards PLOF.  Follow Up Recommendations  Home health PT;Supervision for mobility/OOB     Equipment Recommendations  None recommended by PT;Other (comment)    Recommendations for Other Services       Precautions / Restrictions Precautions Precautions: Anterior Hip Restrictions Weight Bearing Restrictions: Yes LLE Weight Bearing: Weight bearing as tolerated    Mobility  Bed Mobility Overal bed mobility: Needs Assistance Bed Mobility: Supine to Sit     Supine to sit: Min guard;HOB elevated     General bed mobility comments: uses rail, some assistance for L LE  Transfers Overall transfer level: Needs assistance Equipment used: Rolling walker (2 wheeled) Transfers: Sit to/from [facility] Sit to Stand: Min assist Stand pivot transfers: Min guard       General transfer comment: cues for hand placement, anterior weight shifting and power technique of LEs to assist in standing from lower surface.  Ambulation/Gait Ambulation/Gait assistance: Min guard Ambulation Distance (Feet): 220 Feet Assistive device: Rolling walker (2 wheeled) Gait Pattern/deviations: Step-to  pattern;Decreased weight shift to left;Narrow base of support Gait velocity: reduced   General Gait Details: Pt demonstrated slow but steady gait. She had one slight LOB with self correction. Posture improved, reducing workload on UEs. Cues for staying close to [place], increased step length, BOS and pursed lip breathing.   Stairs            Wheelchair Mobility    Modified Rankin (Stroke Patients Only)       Balance Overall balance assessment: Needs assistance Sitting-balance support: No upper extremity supported Sitting balance-Leahy Scale: Good     Standing balance support: Bilateral upper extremity supported Standing balance-Leahy Scale: Fair Standing balance comment: slightly unsteady at first, but is able to maintain balance after a few seconds independently                    Cognition Arousal/Alertness: Awake/alert Behavior During Therapy: WFL for tasks assessed/performed Overall Cognitive Status: Within Functional Limits for tasks assessed                      Exercises Other Exercises Other Exercises: Pt ambulated 220 ft with FWW and min guard with cues for heel to toe and step through pattern. Slow but steady gait with one slight LOB with self correction. Other Exercises: PT recommended that husband stand next to pt the first couple of days when she is ambulating to increase safety, incase she were to lose her balance.    General Comments General comments (skin integrity, edema, etc.): hemovac and IV removed      Pertinent Vitals/Pain Pain Assessment: 0-10 Pain Score: 7  Pain Location: L hip Pain Descriptors / Indicators: Aching Pain Intervention(s): Limited activity within patient's tolerance;Monitored during session;Premedicated before session    Home Living                      Prior Function            PT Goals (current goals can now be found in the care plan section) Acute Rehab PT Goals Patient Stated Goal: To get back  to doing what she was doing prior to surgery PT Goal Formulation: With patient/family Time For Goal Achievement: 12/26/15 Potential to Achieve Goals: Good Progress towards PT goals: Progressing toward goals    Frequency  BID    PT Plan Current plan remains appropriate    Co-evaluation             End of Session Equipment Utilized During Treatment: Gait belt Activity Tolerance: Patient tolerated treatment well;Patient limited by fatigue Patient left: in chair;with call bell/phone within reach;with chair alarm set;with family/visitor present;with SCD's reapplied     Time: 0932-1000 PT Time Calculation (min) (ACUTE ONLY): 28 min  Charges:  $Gait Training: 8-22 mins $Therapeutic Activity: 8-22 mins                    G Codes:      Neoma Laming, PT, DPT  12/13/2015, 10:20 AM [phone]

## 2015-12-13 NOTE — Progress Notes (Signed)
Physical Therapy Treatment Patient Details Name: Becky Trevino MRN: [id number] DOB: [birth year] Today's Date: 12/13/2015    History of Present Illness 70 yo F s/p L anterior THR with PMH of DM, HTN, CKD, OA, obesity, apnea, and H LD.    PT Comments    Pt showed improvement in independence and safety during transfers and ambulation. Less cues required for gait pattern and technique. With practice of multiple STS, pt able to improve ability to stand without physical assistance. SpO2 97% on RA and HR 90. Pt received pain medicine at the end of the session. She demonstrated some signs of anxiety at the beginning of the session but eased as the session went on. She will benefit from continued skilled PT to improve overall strength and functional mobility.  Follow Up Recommendations  Home health PT;Supervision for mobility/OOB     Equipment Recommendations  None recommended by PT;Other (comment)    Recommendations for Other Services       Precautions / Restrictions Precautions Precautions: Anterior Hip Restrictions Weight Bearing Restrictions: Yes LLE Weight Bearing: Weight bearing as tolerated    Mobility  Bed Mobility Overal bed mobility: Needs Assistance Bed Mobility: Sit to Supine     Supine to sit: Min guard Sit to supine: Min guard   General bed mobility comments: uses rail, some assistance for L LE  Transfers Overall transfer level: Needs assistance Equipment used: Rolling walker (2 wheeled) Transfers: Sit to/from [facility] Sit to Stand: Min guard Stand pivot transfers: Min guard       General transfer comment: cues for hand placement, anterior weight shifting and power technique of LEs to assist in standing from lower surface.  Ambulation/Gait Ambulation/Gait assistance: Min guard Ambulation Distance (Feet): 220 Feet Assistive device: Rolling walker (2 wheeled) Gait Pattern/deviations: Decreased stride length     General Gait Details: Pt  demonstrated slow but steady gait. No LOB. Posture improved, reducing workload on UEs. Cues for staying close to [place], increased step length, BOS and pursed lip breathing. Improving in smoothness of gait and step through pattern.   Stairs            Wheelchair Mobility    Modified Rankin (Stroke Patients Only)       Balance Overall balance assessment: Needs assistance Sitting-balance support: No upper extremity supported Sitting balance-Leahy Scale: Good     Standing balance support: Single extremity supported Standing balance-Leahy Scale: Fair Standing balance comment: maintains without LOB                    Cognition Arousal/Alertness: Awake/alert Behavior During Therapy: WFL for tasks assessed/performed Overall Cognitive Status: Within Functional Limits for tasks assessed                      Exercises Other Exercises Other Exercises: Pt ambulated 220 ft with FWW and min guard with cues for heel to toe and step through pattern. Slow but steady gait with one slight LOB with self correction. Other Exercises: Performed STS from EOB at normal height x5 with min guard with cues for set up and technique of using legs and shifting weight anteriorly to improve ability and ease. With practive pt progressed and didn't require physical assistance.    General Comments        Pertinent Vitals/Pain Pain Assessment: 0-10 Pain Score: 7  Pain Location: L hip Pain Descriptors / Indicators: Aching Pain Intervention(s): Limited activity within patient's tolerance;Monitored during session;Patient requesting pain meds-RN notified  Home Living                      Prior Function            PT Goals (current goals can now be found in the care plan section) Acute Rehab PT Goals Patient Stated Goal: To get back to doing what she was doing prior to surgery PT Goal Formulation: With patient/family Time For Goal Achievement: 12/26/15 Potential to Achieve  Goals: Good Progress towards PT goals: Progressing toward goals    Frequency  BID    PT Plan Current plan remains appropriate    Co-evaluation             End of Session Equipment Utilized During Treatment: Gait belt Activity Tolerance: Patient tolerated treatment well;Patient limited by fatigue Patient left: in bed;with call bell/phone within reach;with bed alarm set;with nursing/sitter in room;with SCD's reapplied     Time: 1400-1427 PT Time Calculation (min) (ACUTE ONLY): 27 min  Charges:  $Gait Training: 8-22 mins $Therapeutic Activity: 8-22 mins                    G Codes:      Neoma Laming, PT, DPT  12/13/2015, 2:40 PM [phone]

## 2015-12-13 NOTE — Progress Notes (Signed)
Patient BP 105/58. Hospitalist called to see if he wanted patient to receive 20mg  lasix and Htcz 20mg . MD order to hold [place].

## 2015-12-13 NOTE — Progress Notes (Signed)
   Subjective: 2 Days Post-Op Procedure(s) (LRB): TOTAL HIP ARTHROPLASTY ANTERIOR APPROACH (Left) Patient reports pain as 6 on 0-10 scale.   Patient is well, and has had no acute complaints or problems Denies any CP, SOB, ABD pain. We will continue therapy today.  Plan is to go home with HHPT   Objective: Vital signs in last 24 hours: Temp:  [96 F (35.6 C)-99.9 F (37.7 C)] 99.9 F (37.7 C) (03/30 0755) Pulse Rate:  [83-96] 92 (03/30 0755) Resp:  [16-18] 18 (03/30 0755) BP: (96-130)/(54-94) 107/54 mmHg (03/30 0755) SpO2:  [95 %-99 %] 99 % (03/30 0755)  Intake/Output from previous day: 03/29 0701 - 03/30 0700 In: 2985 [I.V.:1951.7; IV Piggyback:1033.3] Out: 750 [Urine:700; Drains:50] Intake/Output this shift:     Recent Labs  12/11/15 1920 12/13/15 0627  HGB 12.2 10.7*    Recent Labs  12/11/15 1920 12/13/15 0627  WBC 9.8 9.2  RBC 3.78* 3.29*  HCT 36.3 31.7*  PLT 171 158    Recent Labs  12/11/15 1920 12/13/15 0627  NA  --  134*  K  --  3.9  CL  --  104  CO2  --  26  BUN  --  13  CREATININE 1.16* 0.92  GLUCOSE  --  101*  CALCIUM  --  8.1*   No results for input(s): LABPT, INR in the last 72 hours.  EXAM General - Patient is Alert, Appropriate and Oriented Extremity - Neurovascular intact Sensation intact distally Intact pulses distally Dorsiflexion/Plantar flexion intact Dressing - dressing C/D/I, scant drainage and hemovac removed Motor Function - intact, moving foot and toes well on exam.   Past Medical History  Diagnosis Date  . Hyperlipidemia   . Hypertension   . Diabetes mellitus without complication ([place])   . Obesity   . Leg varices   . Chronic kidney disease     Stage 3  . Intertrigo   . DDD (degenerative disc disease), cervical     [facility]  . Symptomatic menopausal or female climacteric states   . Cervical spondylosis   . OSA (obstructive sleep apnea)   . Leg cramps     Assessment/Plan:   2 Days Post-Op  Procedure(s) (LRB): TOTAL HIP ARTHROPLASTY ANTERIOR APPROACH (Left) Active Problems:   Primary osteoarthritis of left hip   Acute post op blood loss anemia    Estimated body mass index is 35.22 kg/(m^2) as calculated from the following:   Height as of this encounter: 5\' 11"  (1.803 m).   Weight as of this encounter: 114.488 kg (252 lb 6.4 oz). Advance diet Up with therapy  Needs BM Plan on discharge to home with HHPT pending BM today  DVT Prophylaxis - Lovenox, Foot Pumps and TED hose Weight-Bearing as tolerated to left leg D/C O2 and Pulse OX and try on Room Air  T. Rachelle Hora, PA-C [place] 12/13/2015, 8:30 AM

## 2015-12-13 NOTE — Discharge Instructions (Signed)
°ANTERIOR APPROACH TOTAL HIP REPLACEMENT POSTOPERATIVE DIRECTIONS ° ° °Hip Rehabilitation, Guidelines Following Surgery  °The results of a hip operation are greatly improved after range of motion and muscle strengthening exercises. Follow all safety measures which are given to protect your hip. If any of these exercises cause increased pain or swelling in your joint, decrease the amount until you are comfortable again. Then slowly increase the exercises. Call your caregiver if you have problems or questions.  ° °HOME CARE INSTRUCTIONS  °Remove items at home which could result in a fall. This includes throw rugs or furniture in walking pathways.  °· ICE to the affected hip every three hours for 30 minutes at a time and then as needed for pain and swelling.  Continue to use ice on the hip for pain and swelling from surgery. You may notice swelling that will progress down to the foot and ankle.  This is normal after surgery.  Elevate the leg when you are not up walking on it.   °· Continue to use the breathing machine which will help keep your temperature down.  It is common for your temperature to cycle up and down following surgery, especially at night when you are not up moving around and exerting yourself.  The breathing machine keeps your lungs expanded and your temperature down. °· Do not place pillow under knee, focus on keeping the knee straight while resting ° °DIET °You may resume your previous home diet once your are discharged from the hospital. ° °DRESSING / WOUND CARE / SHOWERING °Keep dressing clean and dry. Change dressing only as needed. You may shower after your first follow up appointment with [facility]. ° °ACTIVITY °Walk with your walker as instructed. °Use walker as long as suggested by your caregivers. °Avoid periods of inactivity such as sitting longer than an hour when not asleep. This helps prevent blood clots.  °You may resume a sexual relationship in one month or when given the OK  by your doctor.  °You may return to work once you are cleared by your doctor.  °Do not drive a car for 6 weeks or until released by you surgeon.  °Do not drive while taking narcotics. ° °WEIGHT BEARING °Weight bearing as tolerated. Use walker/cane as needed for at least 4 weeks post op. ° °POSTOPERATIVE CONSTIPATION PROTOCOL °Constipation - defined medically as fewer than three stools per week and severe constipation as less than one stool per week. ° °One of the most common issues patients have following surgery is constipation.  Even if you have a regular bowel pattern at home, your normal regimen is likely to be disrupted due to multiple reasons following surgery.  Combination of anesthesia, postoperative narcotics, change in appetite and fluid intake all can affect your bowels.  In order to avoid complications following surgery, here are some recommendations in order to help you during your recovery period. ° °Colace (docusate) - Pick up an over-the-counter form of Colace or another stool softener and take twice a day as long as you are requiring postoperative pain medications.  Take with a full glass of water daily.  If you experience loose stools or diarrhea, hold the colace until you stool forms back up.  If your symptoms do not get better within 1 week or if they get worse, check with your doctor. ° °Dulcolax (bisacodyl) - Pick up over-the-counter and take as directed by the product packaging as needed to assist with the movement of your bowels.  Take with a   full glass of water.  Use this product as needed if not relieved by Colace only.  ° °MiraLax (polyethylene glycol) - Pick up over-the-counter to have on hand.  MiraLax is a solution that will increase the amount of water in your bowels to assist with bowel movements.  Take as directed and can mix with a glass of water, juice, soda, coffee, or tea.  Take if you go more than two days without a movement. °Do not use MiraLax more than once per day. Call your  doctor if you are still constipated or irregular after using this medication for 7 days in a row. ° °If you continue to have problems with postoperative constipation, please contact the office for further assistance and recommendations.  If you experience "the worst abdominal pain ever" or develop nausea or vomiting, please contact the office immediatly for further recommendations for treatment. ° °ITCHING ° If you experience itching with your medications, try taking only a single pain pill, or even half a pain pill at a time.  You can also use Benadryl over the counter for itching or also to help with sleep.  ° °TED HOSE STOCKINGS °Wear the elastic stockings on both legs for six weeks following surgery during the day but you may remove then at night for sleeping. ° °MEDICATIONS °See your medication summary on the “After Visit Summary” that the nursing staff will review with you prior to discharge.  You may have some home medications which will be placed on hold until you complete the course of blood thinner medication.  It is important for you to complete the blood thinner medication as prescribed by your surgeon.  Continue your approved medications as instructed at time of discharge. ° °PRECAUTIONS °If you experience chest pain or shortness of breath - call 911 immediately for transfer to the hospital emergency department.  °If you develop a fever greater that 101 F, purulent drainage from wound, increased redness or drainage from wound, foul odor from the wound/dressing, or calf pain - CONTACT YOUR SURGEON.   °                                                °FOLLOW-UP APPOINTMENTS °Make sure you keep all of your appointments after your operation with your surgeon and caregivers. You should call the office at the above phone number and make an appointment for approximately two weeks after the date of your surgery or on the date instructed by your surgeon outlined in the "After Visit Summary". ° °RANGE OF MOTION  AND STRENGTHENING EXERCISES  °These exercises are designed to help you keep full movement of your hip joint. Follow your caregiver's or physical therapist's instructions. Perform all exercises about fifteen times, three times per day or as directed. Exercise both hips, even if you have had only one joint replacement. These exercises can be done on a training (exercise) mat, on the floor, on a table or on a bed. Use whatever works the best and is most comfortable for you. Use music or television while you are exercising so that the exercises are a pleasant break in your day. This will make your life better with the exercises acting as a break in routine you can look forward to.  °Lying on your back, slowly slide your foot toward your buttocks, raising your knee up off the floor. Then   slowly slide your foot back down until your leg is straight again.  °Lying on your back spread your legs as far apart as you can without causing discomfort.  °Lying on your side, raise your upper leg and foot straight up from the floor as far as is comfortable. Slowly lower the leg and repeat.  °Lying on your back, tighten up the muscle in the front of your thigh (quadriceps muscles). You can do this by keeping your leg straight and trying to raise your heel off the floor. This helps strengthen the largest muscle supporting your knee.  °Lying on your back, tighten up the muscles of your buttocks both with the legs straight and with the knee bent at a comfortable angle while keeping your heel on the floor.  ° °IF YOU ARE TRANSFERRED TO A SKILLED REHAB FACILITY °If the patient is transferred to a skilled rehab facility following release from the hospital, a list of the current medications will be sent to the facility for the patient to continue.  When discharged from the skilled rehab facility, please have the facility set up the patient's Home Health Physical Therapy prior to being released. Also, the skilled facility will be responsible  for providing the patient with their medications at time of release from the facility to include their pain medication, the muscle relaxants, and their blood thinner medication. If the patient is still at the rehab facility at time of the two week follow up appointment, the skilled rehab facility will also need to assist the patient in arranging follow up appointment in our office and any transportation needs. ° °MAKE SURE YOU:  °Understand these instructions.  °Get help right away if you are not doing well or get worse.  ° ° °Pick up stool softner and laxative for home use following surgery while on pain medications. °Continue to use ice for pain and swelling after surgery. °Do not use any lotions or creams on the incision until instructed by your surgeon. ° °

## 2015-12-13 NOTE — Progress Notes (Signed)
Occupational Therapy Treatment Patient Details Name: Becky Trevino MRN: [id number] DOB: 1946/04/23 Today's Date: 12/13/2015    History of present illness 70 yo F s/p L anterior THR with PMH of DM, HTN, CKD, OA, obesity, apnea, and H LD.   OT comments  Pt. Is a 70 y.o. Female who was admitted for a LTHR with anterior hip precautions. Pt. Is able to recall and follow through with hip precautions. Pt. requires minA for LE dressing with A/E use. Pt. reports plans to return home tomorrow. Pt. Could benefit from [facility] to continue reviewing ADL functioning  Follow Up Recommendations  Home health OT    Equipment Recommendations       Recommendations for Other Services PT consult    Precautions / Restrictions Precautions Precautions: Anterior Hip Restrictions Weight Bearing Restrictions: Yes LLE Weight Bearing: Weight bearing as tolerated       Mobility Bed Mobility    Balance                 ADL Overall ADL's : Modified independent                                       General ADL Comments: Pt. requires minA LE dressing with A/E use. Pt. requires verbal and tactile cues for proper techniques.       Vision                     Perception     Praxis      Cognition   Behavior During Therapy: WFL for tasks assessed/performed Overall Cognitive Status: Within Functional Limits for tasks assessed                       Extremity/Trunk Assessment               Exercises Other Exercises Other Exercises: Pt ambulated 220 ft with FWW and min guard with cues for heel to toe and step through pattern. Slow but steady gait with one slight LOB with self correction. Other Exercises: PT recommended that husband stand next to pt the first couple of days when she is ambulating to increase safety, incase she were to lose her balance.   Shoulder Instructions       General Comments      Pertinent Vitals/ Pain       Pain Assessment:  0-10 Pain Score: 7  Pain Location: L hip Pain Descriptors / Indicators: Aching Pain Intervention(s): Limited activity within patient's tolerance;Monitored during session;Premedicated before session  Home Living                                          Prior Functioning/Environment              Frequency Min 2X/week     Progress Toward Goals  OT Goals(current goals can now be found in the care plan section)     Acute Rehab OT Goals Patient Stated Goal: To get back to doing what she was doing prior to surgery OT Goal Formulation: With patient/family Time For Goal Achievement: 12/26/15 Potential to Achieve Goals: Good  Plan      Co-evaluation                 End of Session  Activity Tolerance Patient tolerated treatment well   Patient Left in chair;with call bell/phone within reach;with chair alarm set   Nurse Communication          Time: [id number] OT Time Calculation (min): 25 min  Charges: OT General Charges $OT Visit: 1 Procedure OT Treatments $Self Care/Home Management : 23-37 mins  Harrel Carina, MS, OTR/L   Harrel Carina 12/13/2015, 11:54 AM

## 2015-12-13 NOTE — Discharge Summary (Signed)
Physician Discharge Summary  Patient ID: GREDMARIE RUSTON MRN: [id number] DOB/AGE: 70-Apr-1947 70 y.o.  Admit date: 12/11/2015 Discharge date: 12/14/2015 Admission Diagnoses:  LEFT HIP PAIN   Discharge Diagnoses: Patient Active Problem List   Diagnosis Date Noted  . Primary osteoarthritis of left hip 12/11/2015  . Benign essential HTN 04/21/2015  . Edema leg 04/21/2015  . Chronic kidney disease (CKD), stage III (moderate) 04/21/2015  . Chronic venous insufficiency 04/21/2015  . DDD (degenerative disc disease), cervical 04/21/2015  . Diabetes mellitus with renal manifestation ([place]) 04/21/2015  . Dyslipidemia 04/21/2015  . Bladder cystocele 04/21/2015  . Urinary incontinence 04/21/2015  . Leg varices 04/21/2015  . Insomnia 04/21/2015  . Eczema intertrigo 04/21/2015  . Obesity, morbid ([place]) 04/21/2015  . Obstructive apnea 04/21/2015  . Menopausal and perimenopausal disorder 04/21/2015  . Supraventricular premature beats 04/21/2015  . Osteoarthritis of multiple joints 04/21/2015  . H/O Spinal surgery 11/14/2013  . Detrusor muscle hypertonia 11/07/2013    Past Medical History  Diagnosis Date  . Hyperlipidemia   . Hypertension   . Diabetes mellitus without complication ([place])   . Obesity   . Leg varices   . Chronic kidney disease     Stage 3  . Intertrigo   . DDD (degenerative disc disease), cervical     [facility]  . Symptomatic menopausal or female climacteric states   . Cervical spondylosis   . OSA (obstructive sleep apnea)   . Leg cramps      Transfusion: none   Consultants (if any):    Discharged Condition: Improved  Hospital Course: NORELLE FINKELSTEIN is an 70 y.o. female who was admitted 12/11/2015 with a diagnosis of left hip osteoarthritis and went to the operating room on 12/11/2015 and underwent the above named procedures.    Surgeries: Procedure(s): TOTAL HIP ARTHROPLASTY ANTERIOR APPROACH on 12/11/2015 Patient tolerated the surgery well. Taken to PACU  where she was stabilized and then transferred to the orthopedic floor.  Started on Lovenox 40 mg q 24 hrs. Foot pumps applied bilaterally at 80 mm. Heels elevated on bed with rolled towels. No evidence of DVT. Negative Homan. Physical therapy started on day #1 for gait training and transfer. OT started day #1 for ADL and assisted devices.  Patient's foley was d/c on day #1. Patient's IV was d/c on day #2.  On post op day #3 patient was stable and ready for discharge to home with home health PT.  Implants: Medacta AMIS 3 STEM with 54 mm Mpact cup DM with liner and S 28 mm head  She was given perioperative antibiotics:  Anti-infectives    Start     Dose/Rate Route Frequency Ordered Stop   12/11/15 1800  ceFAZolin (ANCEF) IVPB 2g/100 mL premix     2 g 200 mL/hr over 30 Minutes Intravenous Every 6 hours 12/11/15 1757 12/12/15 0552   12/11/15 1121  ceFAZolin (ANCEF) 2-4 GM/100ML-% IVPB    Comments:  Rexanne Mano: cabinet override      12/11/15 1121 12/11/15 2329   12/11/15 0230  vancomycin (VANCOCIN) 500 mg in sodium chloride 0.9 % 100 mL IVPB     500 mg 100 mL/hr over 60 Minutes Intravenous  Once 12/11/15 0221 12/11/15 1425   12/11/15 0230  ceFAZolin (ANCEF) IVPB 2g/100 mL premix     2 g 200 mL/hr over 30 Minutes Intravenous  Once 12/11/15 0221 12/11/15 1427    .  She was given sequential compression devices, early ambulation, and lovenox for DVT prophylaxis.  She benefited maximally from the hospital stay and there were no complications.    Recent vital signs:  Filed Vitals:   12/13/15 0417 12/13/15 0755  BP: 96/62 107/54  Pulse: 95 92  Temp: 99.1 F (37.3 C) 99.9 F (37.7 C)  Resp: 16 18    Recent laboratory studies:  Lab Results  Component Value Date   HGB 10.7* 12/13/2015   HGB 12.2 12/11/2015   HGB 10.4* 10/18/2012   Lab Results  Component Value Date   WBC 9.2 12/13/2015   PLT 158 12/13/2015   Lab Results  Component Value Date   INR 1.05 11/28/2015    Lab Results  Component Value Date   NA 134* 12/13/2015   K 3.9 12/13/2015   CL 104 12/13/2015   CO2 26 12/13/2015   BUN 13 12/13/2015   CREATININE 0.92 12/13/2015   GLUCOSE 101* 12/13/2015    Discharge Medications:     Medication List    STOP taking these medications        HYDROcodone-acetaminophen 5-325 MG tablet  Commonly known as:  NORCO/VICODIN      TAKE these medications        acetaminophen 500 MG tablet  Commonly known as:  TYLENOL  Take 1 tablet by mouth as needed.     aspirin 81 MG chewable tablet  Chew 1 tablet by mouth daily.     atorvastatin 40 MG tablet  Commonly known as:  LIPITOR  Take 1 tablet (40 mg total) by mouth every evening.     CALCIUM 600-D 600-400 MG-UNIT tablet  Generic drug:  Calcium Carbonate-Vitamin D  Take 1 tablet by mouth 2 (two) times daily.     celecoxib 200 MG capsule  Commonly known as:  CELEBREX  Take 200 mg by mouth as needed.     [employer] LANCING DEVICE Misc  1 each.     enoxaparin 40 MG/0.4ML injection  Commonly known as:  LOVENOX  Inject 0.4 mLs (40 mg total) into the skin daily.     furosemide 20 MG tablet  Commonly known as:  LASIX  Take 1 tablet by mouth daily.     gabapentin 300 MG capsule  Commonly known as:  NEURONTIN  TAKE 1 CAPSULE (300 MG TOTAL) BY MOUTH EVERY EVENING.     glucose blood test strip  Commonly known as:  ONE TOUCH ULTRA TEST  Use as instructed     hydrochlorothiazide 12.5 MG tablet  Commonly known as:  HYDRODIURIL  Take 1 tablet (12.5 mg total) by mouth daily.     imipramine 25 MG tablet  Commonly known as:  TOFRANIL  Take 1 tablet by mouth daily.     ketoconazole 2 % cream  Commonly known as:  NIZORAL  Apply 1 application topically 2 (two) times daily.     latanoprost 0.005 % ophthalmic solution  Commonly known as:  XALATAN  Apply 1 drop to eye Nightly.     Magnesium Oxide 500 MG Caps  Take 1 capsule by mouth daily.     Medical Compression Stockings Misc     metoprolol  succinate 100 MG 24 hr tablet  Commonly known as:  TOPROL-XL  Take 1 tablet (100 mg total) by mouth daily. Take with or immediately following a meal.     oxyCODONE 5 MG immediate release tablet  Commonly known as:  Oxy IR/ROXICODONE  Take 1-2 tablets (5-10 mg total) by mouth every 3 (three) hours as needed for breakthrough pain.  zolpidem 5 MG tablet  Commonly known as:  AMBIEN  Take 1 tablet by mouth every evening.        Diagnostic Studies: Dg Hip Operative Unilat W Or W/o Pelvis Left  12/11/2015  CLINICAL DATA:  Hip fracture fixation. EXAM: OPERATIVE left HIP (WITH PELVIS IF PERFORMED) 2 VIEWS TECHNIQUE: Fluoroscopic spot image(s) were submitted for interpretation post-operatively. COMPARISON:  None. FINDINGS: The bipolar hip prosthesis appears well seated. No complicating features are demonstrated. IMPRESSION: Bipolar hip prosthesis in good position without complicating features. Electronically Signed   By: Marijo Sanes M.D.   On: 12/11/2015 18:06   Dg Hip Unilat W Or W/o Pelvis 2-3 Views Left  12/11/2015  CLINICAL DATA:  Hip fracture fixation. EXAM: OPERATIVE left HIP (WITH PELVIS IF PERFORMED) 2 VIEWS TECHNIQUE: Fluoroscopic spot image(s) were submitted for interpretation post-operatively. COMPARISON:  None. FINDINGS: The bipolar hip prosthesis appears well seated. No complicating features are demonstrated. IMPRESSION: Bipolar hip prosthesis in good position without complicating features. Electronically Signed   By: Marijo Sanes M.D.   On: 12/11/2015 18:06    Disposition:         Follow-up Information    Follow up with MENZ,MICHAEL, MD In 2 weeks.   Specialty:  Orthopedic Surgery   Why:  For staple removal and skin check   Contact information:   [place] [phone]        Signed: Feliberto Gottron 12/13/2015, 8:34 AM

## 2015-12-14 LAB — GLUCOSE, CAPILLARY: GLUCOSE-CAPILLARY: 92 mg/dL (ref 65–99)

## 2015-12-14 LAB — SURGICAL PATHOLOGY

## 2015-12-14 NOTE — Progress Notes (Signed)
DISCHARGE NOTE:  Pt given discharge instructions and prescriptions. Pt verbalized understanding. Pt wheeled to car by staff.  

## 2015-12-14 NOTE — Progress Notes (Signed)
Patient being discharged to home with [facility]. Nurse tech removed IV's. Discharge & RX instructions given. Patient stated understanding.

## 2015-12-14 NOTE — Progress Notes (Signed)
Physical Therapy Treatment Patient Details Name: Becky Trevino MRN: [id number] DOB: 10-Nov-1945 Today's Date: 12/14/2015    History of Present Illness 70 yo F s/p L anterior THR with PMH of DM, HTN, CKD, OA, obesity, apnea, and H LD.    PT Comments    Pt demonstrated significant improvement in transfers and ambulation. She showed improved technique and ability to come to standing position from regular height surfaces with only min guard for safety. Gait speed and pattern improving including upright posture, step through pattern and continuous steps with FWW and supervision up to 300 ft. She is scheduled for discharge home today with HHPT and assistance from husband as needed. Pt will benefit from continued skilled PT to increase functional mobility and return to PLOF.  Follow Up Recommendations  Home health PT;Supervision for mobility/OOB     Equipment Recommendations  None recommended by PT;Other (comment)    Recommendations for Other Services       Precautions / Restrictions Precautions Precautions: Anterior Hip Restrictions Weight Bearing Restrictions: Yes LLE Weight Bearing: Weight bearing as tolerated    Mobility  Bed Mobility Overal bed mobility: Needs Assistance Bed Mobility: Sit to Supine     Supine to sit: Min guard Sit to supine: Min guard   General bed mobility comments: uses rail, some assistance for L LE  Transfers Overall transfer level: Needs assistance Equipment used: Rolling walker (2 wheeled) Transfers: Sit to/from [facility] Sit to Stand: Min guard Stand pivot transfers: Min guard       General transfer comment: cues for hand placement, anterior weight shifting and power technique of LEs to assist in standing from lower surface.  Ambulation/Gait Ambulation/Gait assistance: Supervision Ambulation Distance (Feet): 300 Feet Assistive device: Rolling walker (2 wheeled) Gait Pattern/deviations: Decreased stride length;Decreased  weight shift to left;Decreased step length - right;Trunk flexed Gait velocity: improving   General Gait Details: Pt demosntrated improved gait with upright posture, step-through pattern increased, and stability. Cue occasionally for staying close to [facility].   Stairs            Wheelchair Mobility    Modified Rankin (Stroke Patients Only)       Balance Overall balance assessment: Needs assistance Sitting-balance support: No upper extremity supported Sitting balance-Leahy Scale: Good     Standing balance support: Single extremity supported Standing balance-Leahy Scale: Good Standing balance comment: steady with no LOB, improving                    Cognition Arousal/Alertness: Awake/alert Behavior During Therapy: WFL for tasks assessed/performed Overall Cognitive Status: Within Functional Limits for tasks assessed                      Exercises Other Exercises Other Exercises: Pt ambulated 300 ft with FWW and supervision with cues for heel to toe, staying inside FWW and step through pattern. Improving gait speed with steady gait; no LOB. Other Exercises: LE seated therex: L LAQs and marching, B hip abd squeezes and hip abd with manual resistance. Cues for proper technique. Difficulty with L hip marching requiring AAROM.    General Comments        Pertinent Vitals/Pain Pain Assessment: 0-10 Pain Score: 8  Pain Location: L hip Pain Descriptors / Indicators: Aching Pain Intervention(s): Limited activity within patient's tolerance;Monitored during session    Home Living  Prior Function            PT Goals (current goals can now be found in the care plan section) Acute Rehab PT Goals Patient Stated Goal: To get back to doing what she was doing prior to surgery PT Goal Formulation: With patient/family Time For Goal Achievement: 12/26/15 Potential to Achieve Goals: Good Progress towards PT goals: Progressing toward  goals    Frequency  BID    PT Plan Current plan remains appropriate    Co-evaluation             End of Session Equipment Utilized During Treatment: Gait belt Activity Tolerance: Patient tolerated treatment well;Patient limited by fatigue Patient left: in chair;with call bell/phone within reach;with chair alarm set;with nursing/sitter in room     Time: [id number] PT Time Calculation (min) (ACUTE ONLY): 23 min  Charges:  $Gait Training: 8-22 mins $Therapeutic Exercise: 8-22 mins                    G Codes:      Neoma Laming, PT, DPT  12/14/2015, 10:08 AM [phone]

## 2015-12-14 NOTE — Care Management (Signed)
Arville Go notified of patient discharge to home today. No further RNCM needs. Case Closed.

## 2015-12-14 NOTE — Progress Notes (Signed)
   Subjective: 3 Days Post-Op Procedure(s) (LRB): TOTAL HIP ARTHROPLASTY ANTERIOR APPROACH (Left) Patient reports pain as mild.   Patient is well, and has had no acute complaints or problems Denies any CP, SOB, ABD pain. We will continue therapy today.  Plan is to go home with HHPT today   Objective: Vital signs in last 24 hours: Temp:  [98 F (36.7 C)-99.1 F (37.3 C)] 98 F (36.7 C) (03/31 0802) Pulse Rate:  [90-96] 93 (03/31 0802) Resp:  [18] 18 (03/31 0802) BP: (98-145)/(48-73) 145/73 mmHg (03/31 0802) SpO2:  [94 %-100 %] 100 % (03/31 0802)  Intake/Output from previous day: 03/30 0701 - 03/31 0700 In: -  Out: 975 [Urine:975] Intake/Output this shift:     Recent Labs  12/11/15 1920 12/13/15 0627  HGB 12.2 10.7*    Recent Labs  12/11/15 1920 12/13/15 0627  WBC 9.8 9.2  RBC 3.78* 3.29*  HCT 36.3 31.7*  PLT 171 158    Recent Labs  12/11/15 1920 12/13/15 0627  NA  --  134*  K  --  3.9  CL  --  104  CO2  --  26  BUN  --  13  CREATININE 1.16* 0.92  GLUCOSE  --  101*  CALCIUM  --  8.1*   No results for input(s): LABPT, INR in the last 72 hours.  EXAM General - Patient is Alert, Appropriate and Oriented Extremity - Neurovascular intact Sensation intact distally Intact pulses distally Dorsiflexion/Plantar flexion intact Dressing - dressing C/D/I, no drainage and dressing changed this am Motor Function - intact, moving foot and toes well on exam.   Past Medical History  Diagnosis Date  . Hyperlipidemia   . Hypertension   . Diabetes mellitus without complication ([place])   . Obesity   . Leg varices   . Chronic kidney disease     Stage 3  . Intertrigo   . DDD (degenerative disc disease), cervical     [facility]  . Symptomatic menopausal or female climacteric states   . Cervical spondylosis   . OSA (obstructive sleep apnea)   . Leg cramps     Assessment/Plan:   3 Days Post-Op Procedure(s) (LRB): TOTAL HIP ARTHROPLASTY ANTERIOR  APPROACH (Left) Active Problems:   Primary osteoarthritis of left hip   Acute post op blood loss anemia    Estimated body mass index is 35.22 kg/(m^2) as calculated from the following:   Height as of this encounter: 5\' 11"  (1.803 m).   Weight as of this encounter: 114.488 kg (252 lb 6.4 oz). Advance diet Up with therapy  Plan on discharge to home with HHPT today pending BM. Follow up with [facility] in 2 weeks  DVT Prophylaxis - Lovenox, Foot Pumps and TED hose Weight-Bearing as tolerated to left leg D/C O2 and Pulse OX and try on Room Air  T. Rachelle Hora, PA-C [place] 12/14/2015, 8:14 AM

## 2015-12-17 NOTE — Addendum Note (Signed)
Addendum  created 12/17/15 1632 by Iver Nestle, MD   Modules edited: Anesthesia Attestations

## 2016-01-23 DIAGNOSIS — Z96642 Presence of left artificial hip joint: Secondary | ICD-10-CM | POA: Diagnosis not present

## 2016-01-23 DIAGNOSIS — M1711 Unilateral primary osteoarthritis, right knee: Secondary | ICD-10-CM | POA: Diagnosis not present

## 2016-01-24 ENCOUNTER — Other Ambulatory Visit: Payer: Self-pay | Admitting: Orthopedic Surgery

## 2016-01-24 DIAGNOSIS — M1711 Unilateral primary osteoarthritis, right knee: Secondary | ICD-10-CM

## 2016-02-04 ENCOUNTER — Ambulatory Visit
Admission: RE | Admit: 2016-02-04 | Discharge: 2016-02-04 | Disposition: A | Discharge status: Home / Self Care | Payer: BLUE CROSS/BLUE SHIELD | Source: Ambulatory Visit | Attending: Orthopedic Surgery | Admitting: Orthopedic Surgery

## 2016-02-04 DIAGNOSIS — M1611 Unilateral primary osteoarthritis, right hip: Secondary | ICD-10-CM | POA: Diagnosis not present

## 2016-02-04 DIAGNOSIS — M1711 Unilateral primary osteoarthritis, right knee: Secondary | ICD-10-CM | POA: Insufficient documentation

## 2016-02-13 DIAGNOSIS — M898X9 Other specified disorders of bone, unspecified site: Secondary | ICD-10-CM | POA: Diagnosis not present

## 2016-02-13 DIAGNOSIS — M79674 Pain in right toe(s): Secondary | ICD-10-CM | POA: Diagnosis not present

## 2016-02-26 ENCOUNTER — Ambulatory Visit: Payer: BLUE CROSS/BLUE SHIELD | Admitting: Family Medicine

## 2016-02-26 ENCOUNTER — Encounter
Admission: RE | Admit: 2016-02-26 | Discharge: 2016-02-26 | Disposition: A | Discharge status: Home / Self Care | Payer: BLUE CROSS/BLUE SHIELD | Source: Ambulatory Visit | Attending: Orthopedic Surgery | Admitting: Orthopedic Surgery

## 2016-02-26 DIAGNOSIS — Z01812 Encounter for preprocedural laboratory examination: Secondary | ICD-10-CM | POA: Diagnosis present

## 2016-02-26 HISTORY — DX: Diverticulosis of intestine, part unspecified, without perforation or abscess without bleeding: K57.90

## 2016-02-26 LAB — BASIC METABOLIC PANEL
ANION GAP: 7 (ref 5–15)
BUN: 20 mg/dL (ref 6–20)
CHLORIDE: 103 mmol/L (ref 101–111)
CO2: 29 mmol/L (ref 22–32)
Calcium: 9.3 mg/dL (ref 8.9–10.3)
Creatinine, Ser: 1.21 mg/dL — ABNORMAL HIGH (ref 0.44–1.00)
GFR calc Af Amer: 51 mL/min — ABNORMAL LOW (ref >60)
GFR, EST NON AFRICAN AMERICAN: 44 mL/min — AB (ref >60)
GLUCOSE: 82 mg/dL (ref 65–99)
POTASSIUM: 3.7 mmol/L (ref 3.5–5.1)
Sodium: 139 mmol/L (ref 135–145)

## 2016-02-26 LAB — URINALYSIS COMPLETE WITH MICROSCOPIC (ARMC ONLY)
Bilirubin Urine: NEGATIVE
Glucose, UA: NEGATIVE mg/dL
KETONES UR: NEGATIVE mg/dL
LEUKOCYTES UA: NEGATIVE
NITRITE: NEGATIVE
PROTEIN: NEGATIVE mg/dL
SPECIFIC GRAVITY, URINE: 1.016 (ref 1.005–1.030)
pH: 6 (ref 5.0–8.0)

## 2016-02-26 LAB — PROTIME-INR
INR: 1.12
PROTHROMBIN TIME: 14.6 s (ref 11.4–15.0)

## 2016-02-26 LAB — TYPE AND SCREEN
ABO/RH(D): A POS
ANTIBODY SCREEN: NEGATIVE

## 2016-02-26 LAB — CBC
HEMATOCRIT: 35.7 % (ref 35.0–47.0)
HEMOGLOBIN: 11.7 g/dL — AB (ref 12.0–16.0)
MCH: 30.3 pg (ref 26.0–34.0)
MCHC: 32.9 g/dL (ref 32.0–36.0)
MCV: 92 fL (ref 80.0–100.0)
PLATELETS: 167 10*3/uL (ref 150–440)
RBC: 3.87 MIL/uL (ref 3.80–5.20)
RDW: 16.5 % — ABNORMAL HIGH (ref 11.5–14.5)
WBC: 7.4 10*3/uL (ref 3.6–11.0)

## 2016-02-26 LAB — SURGICAL PCR SCREEN
MRSA, PCR: NEGATIVE
Staphylococcus aureus: NEGATIVE

## 2016-02-26 LAB — SEDIMENTATION RATE: SED RATE: 28 mm/h (ref 0–30)

## 2016-02-26 LAB — APTT: APTT: 28 s (ref 24–36)

## 2016-02-26 NOTE — Patient Instructions (Signed)
  Your procedure is scheduled on: March 11, 2016 (Tuesday) Report to Day Surgery.[facility] To find out your arrival time please call [phone] between 1PM - 3PM on March 10, 2016 (Monday)  Remember: Instructions that are not followed completely may result in serious medical risk, up to and including death, or upon the discretion of your surgeon and anesthesiologist your surgery may need to be rescheduled.    __x__ 1. Do not eat food or drink liquids after midnight. No gum chewing or hard candies.     ____ 2. No Alcohol for 24 hours before or after surgery.   __x__ 3. Do Not Smoke For 24 Hours Prior to Your Surgery.   ____ 4. Bring all medications with you on the day of surgery if instructed.    __x__ 5. Notify your doctor if there is any change in your medical condition     (cold, fever, infections).       Do not wear jewelry, make-up, hairpins, clips or nail polish.  Do not wear lotions, powders, or perfumes. You may wear deodorant.  Do not shave 48 hours prior to surgery. Men may shave face and neck.  Do not bring valuables to the hospital.    [facility] is not responsible for any belongings or valuables.               Contacts, dentures or bridgework may not be worn into surgery.  Leave your suitcase in the car. After surgery it may be brought to your room.  For patients admitted to the hospital, discharge time is determined by your                treatment team.   Patients discharged the day of surgery will not be allowed to drive home.   Please read over the following fact sheets that you were given:   MRSA Information and Surgical Site Infection Prevention   ____ Take these medicines the morning of surgery with A SIP OF WATER:    1.   2.   3.   4.  5.  6.  ____ Fleet Enema (as directed)   __x__ Use CHG Soap as directed  ____ Use inhalers on the day of surgery  ____ Stop metformin 2 days prior to surgery    ____ Take 1/2 of usual insulin dose  the night before surgery and none on the morning of surgery.   __x__ Stop Coumadin/Plavix/aspirin on (Stop Aspirin one week prior to surgery)  __x__ Stop Anti-inflammatories on (NO NSAIDS) Tylenol ok to take for pain if needed   ____ Stop supplements until after surgery.    __x__ Bring C-Pap to the hospital.

## 2016-02-26 NOTE — Pre-Procedure Instructions (Signed)
SPECT multiple (stress and rest)12/05/2015  [place]  Result Impression   Normal myocardial perfusion scan no evidence of stress-induced  myocardial ischemia ejection fraction of 72% conclusion negative scan ,  suggest to be an acceptable surgical risk   Result Narrative  Procedure: Pharmacologic Myocardial Perfusion Imaging   ONE day procedure  Indication: SOB (shortness of breath) - Plan: NM myocardial perfusion  SPECT multiple (stress and rest), ECG stress test only  Pre-operative clearance - Plan: NM myocardial perfusion SPECT multiple  (stress and rest), ECG stress test only Ordering Physician:   Dr. Lujean Amel   Clinical History: 70 y.o. year old female preop clearance for surgery Vitals: Height: 71 in Weight: 243 lb Cardiac risk factors include:    Hyperlipidemia, Diabetes, HTN, Family Hx CAD and Obesity    Procedure:  Pharmacologic stress testing was performed with Regadenoson using a single  use 0.4mg /22ml (0.08 mg/ml) prefilled syringe intravenously infused as a  bolus dose. The stress test was stopped due to Infusion completion.  Blood  pressure response was normal.    Rest HR: 65bpm Rest BP: 148/76mmHg Max HR: 85bpm Min BP: 148/38mmHg  Stress Test Administered by: Oswald Hillock, CMA  ECG Interpretation: Rest ECG:  normal sinus rhythm, none Stress ECG:  normal sinus rhythm,  Recovery ECG:  normal sinus rhythm ECG Interpretation:  non-diagnostic due to pharmacologic testing.   Administrations This Visit    regadenoson (LEXISCAN) 0.4 mg/5 mL inj syringe 0.4 mg    Admin Date Action Dose Route Administered By      12/05/2015 Given 0.4 mg Intravenous Roxine Caddy Goard, CNMT            technetium Tc22m sestamibi (CARDIOLITE) injection 0000000 millicurie    Admin Date Action Dose Route Administered By      [id number] Given 0000000 millicurie Intravenous Scott N Goard, CNMT            technetium Tc71m sestamibi (CARDIOLITE)  injection [id number] millicurie    Admin Date Action Dose Route Administered By      [id number] Given [id number] millicurie Intravenous Scott N Goard, CNMT              Gated post-stress perfusion imaging was performed 30 minutes after stress.  Rest images were performed 30 minutes after injection.  Gated LV Analysis:  TID Ratio: 1.15  LVEF= 72 %  FINDINGS: Regional wall motion:  reveals normal myocardial thickening and wall  motion. The overall quality of the study is good.   Artifacts noted: no Left ventricular cavity: normal.  Perfusion Analysis:  SPECT images demonstrate homogeneous tracer  distribution throughout the myocardium.     Status Results Details   Encounter Summary  ECG stress test only3/22/2017  [place]  ECG stress test only3/22/2017  [place]  Component Name Value Range       Status Results Details   Encounter Summary  Echocardiogram 2D complete3/22/2017  [place]  Echocardiogram 2D complete3/22/2017  [place]  Component Name Value Range  LV Ejection Fraction (%) 55   Aortic Valve Stenosis Grade none   Aortic Valve Regurgitation Grade none   Aortic Valve Max Velocity (m/s) 1.5 m/sec   Aortic Valve Stenosis Mean Gradient (mmHg) 5.0 mmHg   Mitral Valve Regurgitation Grade mild   Mitral Valve Stenosis Grade none   Tricuspid Valve Regurgitation Grade moderate   Tricuspid Valve Regurgitation Max Velocity (m/s) 2.8 m/sec   Right Ventricle Systolic Pressure (  mmHg) 41.8 mmHg   LV End Diastolic Diameter (cm) 4.4 cm  LV End Systolic Diameter (cm) 3.2 cm  LV Septum Wall Thickness (cm) 1 cm  LV Posterior Wall Thickness (cm) 1 cm  Left Atrium Diameter (cm) 4 cm   Result Narrative                    CARDIOLOGY DEPARTMENT                      [facility]                         [id number]                  [place] #: [id number]        [address] [place], [place] [address]          Date: 12/05/2015 08:24 AM                                                              Adult Female Age: 70 yrs                   ECHOCARDIOGRAM REPORT                      Outpatient                                                              KC::[id number]         STUDY:CHEST WALL         TAPE:0000:00: 0:00:00       MD1:  CALLWOOD, DWAYNE DENNIS          ECHO:Yes   DOPPLER:Yes  FILE:0000-000-000           BP: 120/84 mmHg         COLOR:Yes  CONTRAST:No       MACHINE:[place]         Height: 71 in     RV BIOPSY:No         3D:No    SOUND QLTY:Moderate        Weight: 243 lb        MEDIUM:None                                           BSA: 2.3 m2  ___________________________________________________________________________________________      HISTORY:DOE       REASON:Assess, LV function   INDICATION:R06.02 Shortness of breath, Z01.818 Pre-operative clearance  ___________________________________________________________________________________________ ECHOCARDIOGRAPHIC MEASUREMENTS 2D DIMENSIONS AORTA             Values  Normal Range      MAIN PA          Values      Normal Range           Annulus:  nm*       [2.1 - 2.5]                PA Main:  nm*       [1.5 - 2.1]         Aorta Sin:  nm*       [2.7 - 3.3]       RIGHT VENTRICLE       ST Junction:  nm*       [2.3 - 2.9]                RV Base:  4.2 cm    [ < 4.2]         Asc.Aorta:  nm*       [2.3 - 3.1]                 RV Mid:  nm*       [ < 3.5]  LEFT VENTRICLE                                        RV Length:  nm*       [ < 8.6]             LVIDd:  4.4 cm    [3.9 - 5.3]       INFERIOR VENA CAVA             LVIDs:  3.2 cm                              Max. IVC:  nm*       [ <= 2.1]                FS:  28.4 %    [> 25]                    Min. IVC:  nm*               SWT:  1.0 cm    [0.5 - 0.9]                   ------------------               PWT:  1.0  cm    [0.5 - 0.9]                   nm* - not measured  LEFT ATRIUM           LA Diam:  4.0 cm    [2.7 - 3.8]       LA A4C Area:  nm*       [ < 20]         LA Volume:  nm*       [22 - 52]  ___________________________________________________________________________________________ ECHOCARDIOGRAPHIC DESCRIPTIONS  AORTIC ROOT         Size:Normal   Dissection:INDETERM FOR DISSECTION  AORTIC VALVE     Leaflets:Tricuspid          Morphology:Normal     Mobility:Fully mobile  LEFT VENTRICLE  Size:Normal               Anterior:Normal  Contraction:Normal                Lateral:Normal   Closest EF:>55% (Estimated)       Septal:Normal    LV Masses:No Masses              Apical:Normal          [id number]                 Inferior:Normal                                  Posterior:Normal Dias.FxClass:(Grade 1) relaxation abnormal, E/A reversal  MITRAL VALVE     Leaflets:Normal               Mobility:Fully mobile   Morphology:Normal  LEFT ATRIUM         Size:MILDLY ENLARGED     LA Masses:No masses    IA Septum:Normal IAS  MAIN PA         Size:Normal  PULMONIC VALVE   Morphology:Normal               Mobility:Fully mobile  RIGHT VENTRICLE         Size:MILDLY ENLARGED     Free Wall:Normal  Contraction:Normal              RV Masses:No Masses  TRICUSPID VALVE     Leaflets:Normal               Mobility:Fully mobile   Morphology:Normal  RIGHT ATRIUM         Size:MILDLY ENLARGED      RA Other:None      RA Mass:No masses  PERICARDIUM        Fluid:No effusion  INFERIOR VENACAVA         Size:Not seen Not Seen   ____________________________________________________________________ DOPPLER ECHO and OTHER SPECIAL PROCEDURES    Aortic:No AR                         No AS           148.0 cm/sec peak vel         8.8 mmHg peak grad           5.0 mmHg mean grad            2.5 cm^2 by DOPPLER     Mitral:MILD MR                       No MS                                         4.1  cm^2 by DOPPLER           MV Inflow E Vel=79.5 cm/sec   MV Annulus E'Vel=7.5 cm/sec           E/E'Ratio=10.6  Tricuspid:MODERATE TR                   No TS           282.0 cm/sec peak TR vel      41.8 mmHg peak RV pressure  Pulmonary:TRIVIAL PR  No PS     ___________________________________________________________________________________________ INTERPRETATION NORMAL LEFT VENTRICULAR SYSTOLIC FUNCTION WITH AN ESTIMATED EF = 55 % NORMAL RIGHT VENTRICULAR SYSTOLIC FUNCTION MODERATE TRICUSPID VALVE INSUFFICIENCY MILD MITRAL VALVE INSUFFICIENCY NO VALVULAR STENOSIS MILD RV ENLARGEMENT MILD BIATRIAL ENLARGEMENT   ___________________________________________________________________________________________ Electronically signed by: Lujean Amel, MD on 12/05/2015 01:06 PM             Performed By: Johnathan Hausen, [place], RVT       Ordering Physician: Lujean Amel ___________________________________________________________________________________________   Status Results Details   Encounter Summary  X-ray hip left 2 or 3 views with or without pelvis3/04/2016  [place]  X-ray hip left 2 or 3 views with or without pelvis3/04/2016  [place]  Result Narrative  Radiographs: AP, lateral x-rays of the left hip were ordered and personally reviewed  today. These show significant protrusio, approximately half a cm with  complete loss of joint space and extensive osteoarthrosis of the left hip.  Advanced osteoarthritis of the right hip without protrusio.    Status Results Details   Encounter Summary  2016 November Bladder scan11/11/2014  [place]  Bladder scan11/11/2014  [facility]  Result Impression  Bladder Scan:      Volume: 18 ml's.      Performed by: Meriel Pica, CMA.      Void status: Post void residual.   Status Results Details   Encounter Summary  August

## 2016-02-28 LAB — URINE CULTURE

## 2016-03-11 ENCOUNTER — Inpatient Hospital Stay: Payer: BLUE CROSS/BLUE SHIELD | Admitting: Anesthesiology

## 2016-03-11 ENCOUNTER — Inpatient Hospital Stay: Payer: BLUE CROSS/BLUE SHIELD

## 2016-03-11 ENCOUNTER — Inpatient Hospital Stay
Admission: RE | Admit: 2016-03-11 | Discharge: 2016-03-14 | DRG: 470 | Disposition: A | Discharge status: Skilled Nursing Facility | Payer: BLUE CROSS/BLUE SHIELD | Source: Ambulatory Visit | Attending: Orthopedic Surgery | Admitting: Orthopedic Surgery

## 2016-03-11 ENCOUNTER — Encounter
Admission: RE | Disposition: A | Discharge status: Skilled Nursing Facility | Payer: Self-pay | Source: Ambulatory Visit | Attending: Orthopedic Surgery

## 2016-03-11 ENCOUNTER — Encounter: Payer: Self-pay | Admitting: *Deleted

## 2016-03-11 DIAGNOSIS — Z7984 Long term (current) use of oral hypoglycemic drugs: Secondary | ICD-10-CM | POA: Diagnosis not present

## 2016-03-11 DIAGNOSIS — Z888 Allergy status to other drugs, medicaments and biological substances status: Secondary | ICD-10-CM | POA: Diagnosis not present

## 2016-03-11 DIAGNOSIS — E785 Hyperlipidemia, unspecified: Secondary | ICD-10-CM | POA: Diagnosis present

## 2016-03-11 DIAGNOSIS — N183 Chronic kidney disease, stage 3 (moderate): Secondary | ICD-10-CM | POA: Diagnosis present

## 2016-03-11 DIAGNOSIS — Z96652 Presence of left artificial knee joint: Secondary | ICD-10-CM | POA: Diagnosis present

## 2016-03-11 DIAGNOSIS — I129 Hypertensive chronic kidney disease with stage 1 through stage 4 chronic kidney disease, or unspecified chronic kidney disease: Secondary | ICD-10-CM | POA: Diagnosis present

## 2016-03-11 DIAGNOSIS — Z6836 Body mass index (BMI) 36.0-36.9, adult: Secondary | ICD-10-CM | POA: Diagnosis not present

## 2016-03-11 DIAGNOSIS — M1711 Unilateral primary osteoarthritis, right knee: Principal | ICD-10-CM | POA: Diagnosis present

## 2016-03-11 DIAGNOSIS — M5030 Other cervical disc degeneration, unspecified cervical region: Secondary | ICD-10-CM | POA: Diagnosis present

## 2016-03-11 DIAGNOSIS — Z79899 Other long term (current) drug therapy: Secondary | ICD-10-CM | POA: Diagnosis not present

## 2016-03-11 DIAGNOSIS — Z981 Arthrodesis status: Secondary | ICD-10-CM

## 2016-03-11 DIAGNOSIS — K5790 Diverticulosis of intestine, part unspecified, without perforation or abscess without bleeding: Secondary | ICD-10-CM | POA: Diagnosis present

## 2016-03-11 DIAGNOSIS — G4733 Obstructive sleep apnea (adult) (pediatric): Secondary | ICD-10-CM | POA: Diagnosis present

## 2016-03-11 DIAGNOSIS — Z96642 Presence of left artificial hip joint: Secondary | ICD-10-CM | POA: Diagnosis present

## 2016-03-11 DIAGNOSIS — E1122 Type 2 diabetes mellitus with diabetic chronic kidney disease: Secondary | ICD-10-CM | POA: Diagnosis present

## 2016-03-11 DIAGNOSIS — E669 Obesity, unspecified: Secondary | ICD-10-CM | POA: Diagnosis present

## 2016-03-11 DIAGNOSIS — M1710 Unilateral primary osteoarthritis, unspecified knee: Secondary | ICD-10-CM | POA: Diagnosis present

## 2016-03-11 DIAGNOSIS — G8918 Other acute postprocedural pain: Secondary | ICD-10-CM

## 2016-03-11 DIAGNOSIS — M25561 Pain in right knee: Secondary | ICD-10-CM | POA: Diagnosis not present

## 2016-03-11 DIAGNOSIS — M47812 Spondylosis without myelopathy or radiculopathy, cervical region: Secondary | ICD-10-CM | POA: Diagnosis present

## 2016-03-11 DIAGNOSIS — Z7401 Bed confinement status: Secondary | ICD-10-CM | POA: Diagnosis not present

## 2016-03-11 HISTORY — PX: TOTAL KNEE ARTHROPLASTY: SHX125

## 2016-03-11 LAB — CBC
HEMATOCRIT: 29.8 % — AB (ref 35.0–47.0)
HEMOGLOBIN: 10.2 g/dL — AB (ref 12.0–16.0)
MCH: 31.3 pg (ref 26.0–34.0)
MCHC: 34.3 g/dL (ref 32.0–36.0)
MCV: 91.5 fL (ref 80.0–100.0)
Platelets: 131 10*3/uL — ABNORMAL LOW (ref 150–440)
RBC: 3.26 MIL/uL — ABNORMAL LOW (ref 3.80–5.20)
RDW: 17 % — AB (ref 11.5–14.5)
WBC: 6.9 10*3/uL (ref 3.6–11.0)

## 2016-03-11 LAB — GLUCOSE, CAPILLARY
Glucose-Capillary: 82 mg/dL (ref 65–99)
Glucose-Capillary: 93 mg/dL (ref 65–99)
Glucose-Capillary: 94 mg/dL (ref 65–99)

## 2016-03-11 LAB — CREATININE, SERUM
Creatinine, Ser: 1.15 mg/dL — ABNORMAL HIGH (ref 0.44–1.00)
GFR calc non Af Amer: 47 mL/min — ABNORMAL LOW (ref >60)
GFR, EST AFRICAN AMERICAN: 55 mL/min — AB (ref >60)

## 2016-03-11 SURGERY — ARTHROPLASTY, KNEE, TOTAL
Anesthesia: Choice | Laterality: Right | Wound class: Clean

## 2016-03-11 MED ORDER — ACETAMINOPHEN 10 MG/ML IV SOLN
INTRAVENOUS | Status: DC | PRN
  Administered 2016-03-11: 1000 mg via INTRAVENOUS

## 2016-03-11 MED ORDER — CALCIUM CARBONATE-VITAMIN D 600-400 MG-UNIT PO TABS: 1.0000 | ORAL_TABLET | Freq: Two times a day (BID) | ORAL | Status: DC

## 2016-03-11 MED ORDER — PHENYLEPHRINE HCL 10 MG/ML IJ SOLN
INTRAMUSCULAR | Status: DC | PRN
  Administered 2016-03-11: 200 ug via INTRAVENOUS
  Administered 2016-03-11 (×6): 100 ug via INTRAVENOUS

## 2016-03-11 MED ORDER — MORPHINE SULFATE (PF) 10 MG/ML IV SOLN
INTRAVENOUS | Status: AC
  Filled 2016-03-11: qty 1

## 2016-03-11 MED ORDER — SODIUM CHLORIDE 0.9 % IJ SOLN
INTRAMUSCULAR | Status: AC
  Filled 2016-03-11: qty 50

## 2016-03-11 MED ORDER — ZOLPIDEM TARTRATE 5 MG PO TABS
5.0000 mg | ORAL_TABLET | Freq: Every evening | ORAL | Status: DC | PRN
Start: 2016-03-11 — End: 2016-03-14

## 2016-03-11 MED ORDER — NEOMYCIN-POLYMYXIN B GU 40-200000 IR SOLN
Status: DC | PRN
  Administered 2016-03-11: 16 mL

## 2016-03-11 MED ORDER — KETOCONAZOLE 2 % EX CREA
1.0000 "application " | TOPICAL_CREAM | Freq: Two times a day (BID) | CUTANEOUS | Status: DC | PRN
  Filled 2016-03-11: qty 15

## 2016-03-11 MED ORDER — METHOCARBAMOL 1000 MG/10ML IJ SOLN
500.0000 mg | Freq: Four times a day (QID) | INTRAVENOUS | Status: DC | PRN
  Filled 2016-03-11: qty 5

## 2016-03-11 MED ORDER — HYDROCHLOROTHIAZIDE 25 MG PO TABS: 12.5000 mg | ORAL_TABLET | Freq: Every day | ORAL | Status: DC

## 2016-03-11 MED ORDER — OXYCODONE HCL 5 MG PO TABS
5.0000 mg | ORAL_TABLET | ORAL | Status: DC | PRN
  Administered 2016-03-12: 10 mg via ORAL
  Administered 2016-03-14: 5 mg via ORAL
  Filled 2016-03-11 (×4): qty 2
  Filled 2016-03-11 (×4): qty 1

## 2016-03-11 MED ORDER — BUPIVACAINE IN DEXTROSE 0.75-8.25 % IT SOLN
INTRATHECAL | Status: DC | PRN
  Administered 2016-03-11: 1.8 mL via INTRATHECAL

## 2016-03-11 MED ORDER — FAMOTIDINE 20 MG PO TABS
ORAL_TABLET | ORAL | Status: AC
  Filled 2016-03-11: qty 1

## 2016-03-11 MED ORDER — IMIPRAMINE HCL 25 MG PO TABS
25.0000 mg | ORAL_TABLET | Freq: Every day | ORAL | Status: DC
  Administered 2016-03-12 – 2016-03-14 (×3): 25 mg via ORAL
  Filled 2016-03-11 (×4): qty 1

## 2016-03-11 MED ORDER — METHOCARBAMOL 500 MG PO TABS
500.0000 mg | ORAL_TABLET | Freq: Four times a day (QID) | ORAL | Status: DC | PRN
  Administered 2016-03-11: 500 mg via ORAL
  Filled 2016-03-11: qty 1

## 2016-03-11 MED ORDER — MAGNESIUM CITRATE PO SOLN
1.0000 | Freq: Once | ORAL | Status: DC | PRN
Start: 2016-03-11 — End: 2016-03-14
  Filled 2016-03-11: qty 296

## 2016-03-11 MED ORDER — CEFAZOLIN SODIUM-DEXTROSE 2-4 GM/100ML-% IV SOLN
2.0000 g | Freq: Once | INTRAVENOUS | Status: AC
  Administered 2016-03-11: 2 g via INTRAVENOUS

## 2016-03-11 MED ORDER — SODIUM CHLORIDE 0.9 % IV SOLN
INTRAVENOUS | Status: DC | PRN
  Administered 2016-03-11: 60 mL

## 2016-03-11 MED ORDER — ACETAMINOPHEN 650 MG RE SUPP
650.0000 mg | Freq: Four times a day (QID) | RECTAL | Status: DC | PRN
Start: 2016-03-11 — End: 2016-03-14

## 2016-03-11 MED ORDER — MORPHINE SULFATE 10 MG/ML IJ SOLN
INTRAMUSCULAR | Status: DC | PRN
  Administered 2016-03-11: 10 mg via INTRAMUSCULAR

## 2016-03-11 MED ORDER — ATORVASTATIN CALCIUM 20 MG PO TABS
40.0000 mg | ORAL_TABLET | Freq: Every evening | ORAL | Status: DC
  Administered 2016-03-11 – 2016-03-13 (×3): 40 mg via ORAL
  Filled 2016-03-11 (×3): qty 2

## 2016-03-11 MED ORDER — ACETAMINOPHEN 325 MG PO TABS: 650.0000 mg | ORAL_TABLET | Freq: Four times a day (QID) | ORAL | Status: DC | PRN

## 2016-03-11 MED ORDER — KETAMINE HCL 50 MG/ML IJ SOLN
INTRAMUSCULAR | Status: DC | PRN
  Administered 2016-03-11: 50 mg via INTRAMUSCULAR

## 2016-03-11 MED ORDER — MENTHOL 3 MG MT LOZG
1.0000 | LOZENGE | OROMUCOSAL | Status: DC | PRN
  Filled 2016-03-11: qty 9

## 2016-03-11 MED ORDER — METOCLOPRAMIDE HCL 5 MG/ML IJ SOLN
5.0000 mg | Freq: Three times a day (TID) | INTRAMUSCULAR | Status: DC | PRN
Start: 2016-03-11 — End: 2016-03-14

## 2016-03-11 MED ORDER — SODIUM CHLORIDE 0.9 % IV SOLN
INTRAVENOUS | Status: DC
  Administered 2016-03-11 – 2016-03-12 (×2): via INTRAVENOUS

## 2016-03-11 MED ORDER — BUPIVACAINE-EPINEPHRINE (PF) 0.25% -1:200000 IJ SOLN
INTRAMUSCULAR | Status: DC | PRN
  Administered 2016-03-11: 30 mL

## 2016-03-11 MED ORDER — MORPHINE SULFATE (PF) 2 MG/ML IV SOLN
2.0000 mg | INTRAVENOUS | Status: DC | PRN
  Administered 2016-03-11 (×2): 2 mg via INTRAVENOUS
  Filled 2016-03-11 (×2): qty 1

## 2016-03-11 MED ORDER — DIPHENHYDRAMINE HCL 12.5 MG/5ML PO ELIX
12.5000 mg | ORAL_SOLUTION | ORAL | Status: DC | PRN
Start: 2016-03-11 — End: 2016-03-14

## 2016-03-11 MED ORDER — OXYCODONE HCL 5 MG PO TABS
5.0000 mg | ORAL_TABLET | ORAL | Status: DC | PRN
  Administered 2016-03-11: 10 mg via ORAL
  Administered 2016-03-11: 5 mg via ORAL
  Administered 2016-03-11 – 2016-03-12 (×3): 10 mg via ORAL
  Administered 2016-03-12: 5 mg via ORAL
  Administered 2016-03-12: 10 mg via ORAL
  Administered 2016-03-13 (×2): 5 mg via ORAL
  Filled 2016-03-11 (×2): qty 1
  Filled 2016-03-11 (×2): qty 2

## 2016-03-11 MED ORDER — HYDROMORPHONE HCL 1 MG/ML IJ SOLN
0.5000 mg | Freq: Once | INTRAMUSCULAR | Status: AC
  Administered 2016-03-11: 0.5 mg via INTRAVENOUS
  Filled 2016-03-11: qty 1

## 2016-03-11 MED ORDER — CALCIUM CARBONATE-VITAMIN D 500-200 MG-UNIT PO TABS
1.0000 | ORAL_TABLET | Freq: Two times a day (BID) | ORAL | Status: DC
  Administered 2016-03-11 – 2016-03-14 (×6): 1 via ORAL
  Filled 2016-03-11 (×6): qty 1

## 2016-03-11 MED ORDER — MAGNESIUM OXIDE 400 (241.3 MG) MG PO TABS
400.0000 mg | ORAL_TABLET | Freq: Every day | ORAL | Status: DC
  Administered 2016-03-11 – 2016-03-14 (×4): 400 mg via ORAL
  Filled 2016-03-11 (×4): qty 1

## 2016-03-11 MED ORDER — GABAPENTIN 300 MG PO CAPS
300.0000 mg | ORAL_CAPSULE | Freq: Every day | ORAL | Status: DC
  Administered 2016-03-11 – 2016-03-13 (×3): 300 mg via ORAL
  Filled 2016-03-11 (×3): qty 1

## 2016-03-11 MED ORDER — MORPHINE SULFATE (PF) 4 MG/ML IV SOLN: 3.0000 mg | INTRAVENOUS | Status: DC | PRN

## 2016-03-11 MED ORDER — ACETAMINOPHEN 10 MG/ML IV SOLN
INTRAVENOUS | Status: AC
  Filled 2016-03-11: qty 100

## 2016-03-11 MED ORDER — ONDANSETRON HCL 4 MG/2ML IJ SOLN: 4.0000 mg | Freq: Once | INTRAMUSCULAR | Status: DC | PRN

## 2016-03-11 MED ORDER — BISACODYL 5 MG PO TBEC
5.0000 mg | DELAYED_RELEASE_TABLET | Freq: Every day | ORAL | Status: DC | PRN
  Administered 2016-03-14: 5 mg via ORAL

## 2016-03-11 MED ORDER — CEFAZOLIN SODIUM-DEXTROSE 2-4 GM/100ML-% IV SOLN
INTRAVENOUS | Status: AC
  Filled 2016-03-11: qty 100

## 2016-03-11 MED ORDER — FUROSEMIDE 20 MG PO TABS
20.0000 mg | ORAL_TABLET | Freq: Every day | ORAL | Status: DC
  Administered 2016-03-12 – 2016-03-14 (×3): 20 mg via ORAL
  Filled 2016-03-11 (×3): qty 1

## 2016-03-11 MED ORDER — SODIUM CHLORIDE 0.9 % IV BOLUS (SEPSIS)
250.0000 mL | Freq: Once | INTRAVENOUS | Status: AC
  Administered 2016-03-11: 250 mL via INTRAVENOUS

## 2016-03-11 MED ORDER — LATANOPROST 0.005 % OP SOLN
1.0000 [drp] | Freq: Every day | OPHTHALMIC | Status: DC
  Administered 2016-03-11 – 2016-03-13 (×3): 1 [drp] via OPHTHALMIC
  Filled 2016-03-11: qty 2.5

## 2016-03-11 MED ORDER — SODIUM CHLORIDE 0.9 % IV BOLUS (SEPSIS)
500.0000 mL | Freq: Once | INTRAVENOUS | Status: AC
  Administered 2016-03-11: 500 mL via INTRAVENOUS

## 2016-03-11 MED ORDER — NEOMYCIN-POLYMYXIN B GU 40-200000 IR SOLN
Status: AC
  Filled 2016-03-11: qty 20

## 2016-03-11 MED ORDER — ONDANSETRON HCL 4 MG PO TABS: 4.0000 mg | ORAL_TABLET | Freq: Four times a day (QID) | ORAL | Status: DC | PRN

## 2016-03-11 MED ORDER — METOCLOPRAMIDE HCL 10 MG PO TABS
5.0000 mg | ORAL_TABLET | Freq: Three times a day (TID) | ORAL | Status: DC | PRN
  Administered 2016-03-11: 10 mg via ORAL
  Filled 2016-03-11: qty 1

## 2016-03-11 MED ORDER — BUPIVACAINE LIPOSOME 1.3 % IJ SUSP
INTRAMUSCULAR | Status: AC
  Filled 2016-03-11: qty 20

## 2016-03-11 MED ORDER — ENOXAPARIN SODIUM 30 MG/0.3ML ~~LOC~~ SOLN
30.0000 mg | Freq: Two times a day (BID) | SUBCUTANEOUS | Status: DC
  Administered 2016-03-12 – 2016-03-14 (×5): 30 mg via SUBCUTANEOUS
  Filled 2016-03-11 (×5): qty 0.3

## 2016-03-11 MED ORDER — EPHEDRINE SULFATE 50 MG/ML IJ SOLN: 25.0000 mg | Freq: Once | INTRAMUSCULAR | Status: DC

## 2016-03-11 MED ORDER — MAGNESIUM OXIDE -MG SUPPLEMENT 500 MG PO CAPS: 1.0000 | ORAL_CAPSULE | Freq: Every day | ORAL | Status: DC

## 2016-03-11 MED ORDER — BUPIVACAINE-EPINEPHRINE (PF) 0.25% -1:200000 IJ SOLN
INTRAMUSCULAR | Status: AC
  Filled 2016-03-11: qty 30

## 2016-03-11 MED ORDER — MAGNESIUM HYDROXIDE 400 MG/5ML PO SUSP
30.0000 mL | Freq: Every day | ORAL | Status: DC | PRN
  Administered 2016-03-14: 30 mL via ORAL

## 2016-03-11 MED ORDER — FENTANYL CITRATE (PF) 100 MCG/2ML IJ SOLN: 25.0000 ug | INTRAMUSCULAR | Status: DC | PRN

## 2016-03-11 MED ORDER — MIDAZOLAM HCL 5 MG/5ML IJ SOLN
INTRAMUSCULAR | Status: DC | PRN
  Administered 2016-03-11: 2 mg via INTRAVENOUS

## 2016-03-11 MED ORDER — POTASSIUM CHLORIDE CRYS ER 10 MEQ PO TBCR
10.0000 meq | EXTENDED_RELEASE_TABLET | Freq: Every day | ORAL | Status: DC
  Administered 2016-03-11 – 2016-03-14 (×4): 10 meq via ORAL
  Filled 2016-03-11 (×6): qty 1

## 2016-03-11 MED ORDER — PROPOFOL 500 MG/50ML IV EMUL
INTRAVENOUS | Status: DC | PRN
  Administered 2016-03-11: 75 ug/kg/min via INTRAVENOUS

## 2016-03-11 MED ORDER — FAMOTIDINE 20 MG PO TABS
20.0000 mg | ORAL_TABLET | Freq: Once | ORAL | Status: AC
  Administered 2016-03-11: 20 mg via ORAL

## 2016-03-11 MED ORDER — PHENOL 1.4 % MT LIQD
1.0000 | OROMUCOSAL | Status: DC | PRN
  Filled 2016-03-11: qty 177

## 2016-03-11 MED ORDER — ALUM & MAG HYDROXIDE-SIMETH 200-200-20 MG/5ML PO SUSP: 30.0000 mL | ORAL | Status: DC | PRN

## 2016-03-11 MED ORDER — EPHEDRINE SULFATE 50 MG/ML IJ SOLN
15.0000 mg | Freq: Once | INTRAMUSCULAR | Status: AC
  Administered 2016-03-11: 15 mg via INTRAVENOUS
  Filled 2016-03-11: qty 0.3

## 2016-03-11 MED ORDER — EPHEDRINE SULFATE 50 MG/ML IJ SOLN
INTRAMUSCULAR | Status: AC
  Administered 2016-03-11: 15 mg via INTRAVENOUS
  Filled 2016-03-11: qty 1

## 2016-03-11 MED ORDER — SODIUM CHLORIDE 0.9 % IJ SOLN
INTRAMUSCULAR | Status: AC
  Filled 2016-03-11: qty 10

## 2016-03-11 MED ORDER — ONDANSETRON HCL 4 MG/2ML IJ SOLN: 4.0000 mg | Freq: Four times a day (QID) | INTRAMUSCULAR | Status: DC | PRN

## 2016-03-11 MED ORDER — METOPROLOL SUCCINATE ER 50 MG PO TB24
100.0000 mg | ORAL_TABLET | Freq: Every evening | ORAL | Status: DC
  Administered 2016-03-12 – 2016-03-13 (×2): 100 mg via ORAL
  Filled 2016-03-11 (×2): qty 2

## 2016-03-11 MED ORDER — DOCUSATE SODIUM 100 MG PO CAPS
100.0000 mg | ORAL_CAPSULE | Freq: Two times a day (BID) | ORAL | Status: DC
  Administered 2016-03-11 – 2016-03-14 (×6): 100 mg via ORAL
  Filled 2016-03-11 (×6): qty 1

## 2016-03-11 MED ORDER — ASPIRIN 81 MG PO CHEW
81.0000 mg | CHEWABLE_TABLET | Freq: Every day | ORAL | Status: DC
  Administered 2016-03-11 – 2016-03-14 (×4): 81 mg via ORAL
  Filled 2016-03-11 (×4): qty 1

## 2016-03-11 MED ORDER — SODIUM CHLORIDE 0.9 % IV SOLN
INTRAVENOUS | Status: DC
  Administered 2016-03-11 (×3): via INTRAVENOUS

## 2016-03-11 MED ORDER — CEFAZOLIN SODIUM-DEXTROSE 2-4 GM/100ML-% IV SOLN
2.0000 g | Freq: Four times a day (QID) | INTRAVENOUS | Status: AC
  Administered 2016-03-11 (×3): 2 g via INTRAVENOUS
  Filled 2016-03-11 (×3): qty 100

## 2016-03-11 SURGICAL SUPPLY — 60 items
BANDAGE ACE 6X5 VEL STRL LF (GAUZE/BANDAGES/DRESSINGS) ×2 IMPLANT
BLADE SAW 1 (BLADE) ×2 IMPLANT
BLOCK CUTTING TIBIAL 4 RT (MISCELLANEOUS) IMPLANT
BLOCK CUTTING TIBIAL 4 RT MIS (MISCELLANEOUS) IMPLANT
BLOCK FEMUR CUTTING 4P RIGHT (MISCELLANEOUS) IMPLANT
CANISTER SUCT 1200ML W/VALVE (MISCELLANEOUS) ×2 IMPLANT
CANISTER SUCT 3000ML (MISCELLANEOUS) ×4 IMPLANT
CAPT KNEE TOTAL 3 ×2 IMPLANT
CATH FOL LEG HOLDER (MISCELLANEOUS) ×2 IMPLANT
CATH TRAY METER 16FR LF (MISCELLANEOUS) ×2 IMPLANT
CEMENT HV SMART SET (Cement) ×4 IMPLANT
CHLORAPREP W/TINT 26ML (MISCELLANEOUS) ×4 IMPLANT
COOLER POLAR GLACIER W/PUMP (MISCELLANEOUS) ×2 IMPLANT
CUFF TOURN 24 STER (MISCELLANEOUS) IMPLANT
CUFF TOURN 30 STER DUAL PORT (MISCELLANEOUS) IMPLANT
DRAPE INCISE IOBAN 66X45 STRL (DRAPES) ×2 IMPLANT
DRAPE SHEET LG 3/4 BI-LAMINATE (DRAPES) ×4 IMPLANT
ELECT CAUTERY BLADE 6.4 (BLADE) ×2 IMPLANT
ELECT REM PT RETURN 9FT ADLT (ELECTROSURGICAL) ×2
ELECTRODE REM PT RTRN 9FT ADLT (ELECTROSURGICAL) ×1 IMPLANT
GAUZE PETRO XEROFOAM 1X8 (MISCELLANEOUS) IMPLANT
GAUZE SPONGE 4X4 12PLY STRL (GAUZE/BANDAGES/DRESSINGS) IMPLANT
GLOVE BIOGEL PI IND STRL 9 (GLOVE) ×1 IMPLANT
GLOVE BIOGEL PI INDICATOR 9 (GLOVE) ×1
GLOVE INDICATOR 8.0 STRL GRN (GLOVE) ×2 IMPLANT
GLOVE SURG ORTHO 8.0 STRL STRW (GLOVE) ×2 IMPLANT
GLOVE SURG ORTHO 9.0 STRL STRW (GLOVE) ×2 IMPLANT
GOWN STRL REUS W/ TWL LRG LVL3 (GOWN DISPOSABLE) ×1 IMPLANT
GOWN STRL REUS W/ TWL XL LVL3 (GOWN DISPOSABLE) ×1 IMPLANT
GOWN STRL REUS W/TWL LRG LVL3 (GOWN DISPOSABLE) ×1
GOWN STRL REUS W/TWL XL LVL3 (GOWN DISPOSABLE) ×1
GOWN SURG XXL (GOWNS) ×2 IMPLANT
HANDPIECE SUCTION TUBG SURGILV (MISCELLANEOUS) ×2 IMPLANT
HOOD PEEL AWAY FLYTE STAYCOOL (MISCELLANEOUS) ×4 IMPLANT
IMMBOLIZER KNEE 19 BLUE UNIV (SOFTGOODS) ×2 IMPLANT
KIT RM TURNOVER STRD PROC AR (KITS) ×2 IMPLANT
KNEE MEDACTA TIBIAL/FEMORAL BL (Knees) ×2 IMPLANT
KNIFE SCULPS 14X20 (INSTRUMENTS) ×2 IMPLANT
NDL SAFETY 18GX1.5 (NEEDLE) ×2 IMPLANT
NEEDLE SPNL 18GX3.5 QUINCKE PK (NEEDLE) ×2 IMPLANT
NEEDLE SPNL 20GX3.5 QUINCKE YW (NEEDLE) ×2 IMPLANT
NS IRRIG 1000ML POUR BTL (IV SOLUTION) ×2 IMPLANT
PACK TOTAL KNEE (MISCELLANEOUS) ×2 IMPLANT
PAD WRAPON POLAR KNEE (MISCELLANEOUS) IMPLANT
PAD WRAPON POLOR MULTI XL (MISCELLANEOUS) ×1 IMPLANT
SOL .9 NS 3000ML IRR  AL (IV SOLUTION) ×1
SOL .9 NS 3000ML IRR UROMATIC (IV SOLUTION) ×1 IMPLANT
STAPLER SKIN PROX 35W (STAPLE) ×2 IMPLANT
SUCTION FRAZIER HANDLE 10FR (MISCELLANEOUS) ×1
SUCTION TUBE FRAZIER 10FR DISP (MISCELLANEOUS) ×1 IMPLANT
SUT DVC 2 QUILL PDO  T11 36X36 (SUTURE) ×1
SUT DVC 2 QUILL PDO T11 36X36 (SUTURE) ×1 IMPLANT
SUT DVC QUILL MONODERM 30X30 (SUTURE) ×2 IMPLANT
SYR 20CC LL (SYRINGE) ×2 IMPLANT
SYR 50ML LL SCALE MARK (SYRINGE) ×4 IMPLANT
TOWEL OR 17X26 4PK STRL BLUE (TOWEL DISPOSABLE) ×2 IMPLANT
TOWER CARTRIDGE SMART MIX (DISPOSABLE) ×2 IMPLANT
WRAP-ON POLOR PAD MULTI XL (MISCELLANEOUS) ×1
WRAPON POLAR PAD KNEE (MISCELLANEOUS)
WRAPON POLOR PAD MULTI XL (MISCELLANEOUS) ×1

## 2016-03-11 NOTE — Anesthesia Procedure Notes (Addendum)
Date/Time: 03/11/2016 8:40 AM Performed by: Nelda Marseille Pre-anesthesia Checklist: Patient identified, Emergency Drugs available, Suction available, Patient being monitored and Timeout performed Oxygen Delivery Method: Simple face mask   Spinal Patient location during procedure: OR Start time: 03/11/2016 7:40 AM End time: 03/11/2016 7:45 AM Staffing Anesthesiologist: Alvin Critchley Performed by: anesthesiologist  Preanesthetic Checklist Completed: patient identified, site marked, surgical consent, pre-op evaluation, timeout performed, IV checked, risks and benefits discussed and monitors and equipment checked Spinal Block Patient position: sitting Prep: Betadine Patient monitoring: heart rate, continuous pulse ox, blood pressure and cardiac monitor Approach: right paramedian Location: L3-4 Injection technique: single-shot Needle Needle type: [place], Introducer and Quincke  Needle gauge: 24 G Needle length: 9 cm Additional Notes Negative paresthesia. Negative blood return. Positive free-flowing CSF. Expiration date of kit checked and confirmed. Patient tolerated procedure well, without complications. With epi wash

## 2016-03-11 NOTE — Progress Notes (Signed)
Pt only able to tolerate bone foam pillow for only 30 minutes

## 2016-03-11 NOTE — Progress Notes (Signed)
bp 88/50. Asx. Pt has received a lot of pain med.. rn spoke with dr Marry Guan who ordered 500cc ns  Bolus and increase ivf to 100/hr

## 2016-03-11 NOTE — Anesthesia Preprocedure Evaluation (Addendum)
Anesthesia Evaluation  Patient identified by MRN, date of birth, ID band Patient awake    Reviewed: Allergy & Precautions, NPO status , Patient's Chart, lab work & pertinent test results  Airway Mallampati: III       Dental  (+) Upper Dentures, Lower Dentures   Pulmonary sleep apnea ,     + decreased breath sounds      Cardiovascular Exercise Tolerance: Poor hypertension, Pt. on medications + Peripheral Vascular Disease   Rhythm:Regular Rate:Normal     Neuro/Psych Detrusor muscle hypertonia  Neuromuscular disease negative psych ROS   GI/Hepatic negative GI ROS, Neg liver ROS,   Endo/Other  diabetes, Well Controlled, Type 2, Oral Hypoglycemic Agents  Renal/GU Renal InsufficiencyRenal disease     Musculoskeletal  (+) Arthritis , Osteoarthritis,    Abdominal (+) + obese,   Peds negative pediatric ROS (+)  Hematology negative hematology ROS (+)   Anesthesia Other Findings No Hx of lumbar lam  Reproductive/Obstetrics                            Anesthesia Physical  Anesthesia Plan  ASA: III  Anesthesia Plan: Spinal   Post-op Pain Management:    Induction:   Airway Management Planned: Nasal Cannula  Additional Equipment:   Intra-op Plan:   Post-operative Plan:   Informed Consent: I have reviewed the patients History and Physical, chart, labs and discussed the procedure including the risks, benefits and alternatives for the proposed anesthesia with the patient or authorized representative who has indicated his/her understanding and acceptance.     Plan Discussed with: CRNA  Anesthesia Plan Comments:        Anesthesia Quick Evaluation

## 2016-03-11 NOTE — Progress Notes (Signed)
Pt with severe postop pain. rn spoke with dr Rudene Christians who ordered dilaudid .5mg  iv once may repeat in 5 minutes if needed

## 2016-03-11 NOTE — Op Note (Signed)
03/11/2016  10:01 AM  PATIENT:  Becky Trevino  70 y.o. female  PRE-OPERATIVE DIAGNOSIS:  osteoarthritis right knee  POST-OPERATIVE DIAGNOSIS:  osteoarthritis right knee  PROCEDURE:  Procedure(s): TOTAL KNEE ARTHROPLASTY (Right)  SURGEON: Laurene Footman, MD  ASSISTANTS: Marylee Floras, PA  ANESTHESIA:   spinal  EBL:  Total I/O In: -  Out: 275 [Urine:125; Blood:150]  BLOOD ADMINISTERED:none  DRAINS: none   LOCAL MEDICATIONS USED:  MARCAINE    and OTHER Exparel and morphine  SPECIMEN:  Source of Specimen:  Cut ends of bone and synovial biopsy sent separately for possible pigmented villonodular synovitis  DISPOSITION OF SPECIMEN:  PATHOLOGY  COUNTS:  YES  TOURNIQUET:   88 minutes at 300 mmHg  IMPLANTS: Medacta GMK sphere system, 4+ femur, 4 tibia, 13 mm insert, to patella with short stem on tibia, all components cemented  DICTATION: .Dragon Dictation patient brought the operating room and after adequate anesthesia was obtained the right leg was prepped and draped in sterile fashion with a bump underneath right buttock after prepping and draping in sterile fashion appropriate patient identification was carried out along timeout procedure and tourniquet was raised. A midline skin incision was made followed by medial parapatellar arthrotomy, showing extensive hemarthrosis and possible PVNS. Synovectomy was carried out and a portion of the synovium sent for synovial analysis. There is extensive wear of all compartments with significant bone loss on the patella. The anterior cruciate ligament and PCL were excised along the anterior horns of the menisci. Proximal tibia preparation was carried out to allow for application of the [place] cutting guide and proximal tibia cut was carried out. Next the distal femoral cut was carried out using the my knee cutting guide. The distal femoral 4-in-1 cutting block was applied anterior posterior and chamfer cuts made with no notching. The proximal  tibia was prepared using the 4 tibia trial with proximal preparation for short stem and keel punch with trials placed a 13 mm insert gave good stability and full extension. The distal femoral drill holes were made followed by the notch cut. All trials were removed and the patella was cut using a freehand technique since it was 19 mm thick getting down to about 12 mm thick and then drill holes were made and sized to a size 2 patella. At this point the bony surfaces were thoroughly irrigated and dried after having let the tourniquet down and injecting the knee with the above local anesthetic. After raising the tourniquet back and drying the bone all components were cemented in place with excess cement removed after the cemented set the knee was thoroughly irrigated with Betadine solution followed by pulse lavage. The arthrotomy was repaired using a heavy Quill, 2-0 Quill subcutaneously and skin staples followed by a Provena plus incisional wound VAC. Polar Care and a knee immobilizer placed and patient sent to recovery in stable condition the patella tracked well with no touch technique and full extension was obtained as well as flexion 115.  PLAN OF CARE: Admit to inpatient   PATIENT DISPOSITION:  PACU - hemodynamically stable.

## 2016-03-11 NOTE — Transfer of Care (Signed)
Immediate Anesthesia Transfer of Care Note  Patient: Becky Trevino  Procedure(s) Performed: Procedure(s): TOTAL KNEE ARTHROPLASTY (Right)  Patient Location: PACU  Anesthesia Type:Spinal  Level of Consciousness: awake and sedated  Airway & Oxygen Therapy: Patient Spontanous Breathing and Patient connected to face mask oxygen  Post-op Assessment: Report given to RN and Post -op Vital signs reviewed and stable  Post vital signs: Reviewed and stable  Last Vitals:  Filed Vitals:   03/11/16 0614  BP: 118/72  Pulse: 77  Temp: 36.8 C  Resp: 16    Last Pain:  Filed Vitals:   03/11/16 0615  PainSc: 3          Complications: No apparent anesthesia complications

## 2016-03-11 NOTE — NC FL2 (Signed)
[place] LEVEL OF CARE SCREENING TOOL     IDENTIFICATION  Patient Name: Becky Trevino Birthdate: 1945/11/08 Sex: female Admission Date (Current Location): 03/11/2016  [place] and [place] Number:  Engineering geologist and Address:  [facility], [address], [place], [place] [address]      Provider Number: [id number]  Attending Physician Name and Address:  Hessie Knows, MD  Relative Name and Phone Number:       Current Level of Care: Hospital Recommended Level of Care: [place] Prior Approval Number:    Date Approved/Denied:   PASRR Number:  ([id number])  Discharge Plan: SNF    Current Diagnoses: Patient Active Problem List   Diagnosis Date Noted  . Primary osteoarthritis of knee 03/11/2016  . Primary osteoarthritis of left hip 12/11/2015  . Benign essential HTN 04/21/2015  . Edema leg 04/21/2015  . Chronic kidney disease (CKD), stage III (moderate) 04/21/2015  . Chronic venous insufficiency 04/21/2015  . DDD (degenerative disc disease), cervical 04/21/2015  . Diabetes mellitus with renal manifestation ([place]) 04/21/2015  . Dyslipidemia 04/21/2015  . Bladder cystocele 04/21/2015  . Urinary incontinence 04/21/2015  . Leg varices 04/21/2015  . Insomnia 04/21/2015  . Eczema intertrigo 04/21/2015  . Obesity, morbid ([place]) 04/21/2015  . Obstructive apnea 04/21/2015  . Menopausal and perimenopausal disorder 04/21/2015  . Supraventricular premature beats 04/21/2015  . Osteoarthritis of multiple joints 04/21/2015  . H/O Spinal surgery 11/14/2013  . Detrusor muscle hypertonia 11/07/2013    Orientation RESPIRATION BLADDER Height & Weight     Self, Time, Situation, Place  Normal Continent Weight:   Height:     BEHAVIORAL SYMPTOMS/MOOD NEUROLOGICAL BOWEL NUTRITION STATUS   (none )  (none ) Continent Diet (Regular Diet )  AMBULATORY STATUS COMMUNICATION OF NEEDS Skin   Extensive Assist Verbally Surgical wounds,  Wound Vac (Incision: Right Knee (Provena Wound Vac) )                       Personal Care Assistance Level of Assistance  Bathing, Feeding, Dressing Bathing Assistance: Limited assistance Feeding assistance: Independent Dressing Assistance: Limited assistance     Functional Limitations Info  Sight, Hearing, Speech Sight Info: Adequate Hearing Info: Adequate Speech Info: Adequate    SPECIAL CARE FACTORS FREQUENCY  PT (By licensed PT), OT (By licensed OT)     PT Frequency:  (5) OT Frequency:  (5)            Contractures      Additional Factors Info  Code Status, Allergies, Isolation Precautions Code Status Info:  (Full Code. ) Allergies Info:  (Lisinopril)     Isolation Precautions Info:  (MRSA Nasal Swab)     Current Medications (03/11/2016):  This is the current hospital active medication list Current Facility-Administered Medications  Medication Dose Route Frequency Provider Last Rate Last Dose  . 0.9 %  sodium chloride infusion   Intravenous Continuous Hessie Knows, MD 75 mL/hr at 03/11/16 1036    . acetaminophen (TYLENOL) tablet 650 mg  650 mg Oral Q6H PRN Hessie Knows, MD       Or  . acetaminophen (TYLENOL) suppository 650 mg  650 mg Rectal Q6H PRN Hessie Knows, MD      . alum & mag hydroxide-simeth (MAALOX/MYLANTA) 200-200-20 MG/5ML suspension 30 mL  30 mL Oral Q4H PRN Hessie Knows, MD      . aspirin chewable tablet 81 mg  81 mg Oral Daily Hessie Knows, MD      .  atorvastatin (LIPITOR) tablet 40 mg  40 mg Oral QPM Hessie Knows, MD      . bisacodyl (DULCOLAX) EC tablet 5 mg  5 mg Oral Daily PRN Hessie Knows, MD      . calcium-vitamin D (OSCAL WITH D) 500-200 MG-UNIT per tablet 1 tablet  1 tablet Oral BID Hessie Knows, MD      . ceFAZolin (ANCEF) 2-4 GM/100ML-% IVPB           . ceFAZolin (ANCEF) IVPB 2g/100 mL premix  2 g Intravenous Q6H Hessie Knows, MD      . diphenhydrAMINE (BENADRYL) 12.5 MG/5ML elixir 12.5-25 mg  12.5-25 mg Oral Q4H PRN Hessie Knows, MD       . docusate sodium (COLACE) capsule 100 mg  100 mg Oral BID Hessie Knows, MD      . Derrill Memo ON 03/12/2016] enoxaparin (LOVENOX) injection 30 mg  30 mg Subcutaneous Q12H Hessie Knows, MD      . famotidine (PEPCID) 20 MG tablet           . furosemide (LASIX) tablet 20 mg  20 mg Oral Daily Hessie Knows, MD      . gabapentin (NEURONTIN) capsule 300 mg  300 mg Oral QHS Hessie Knows, MD      . hydrochlorothiazide (HYDRODIURIL) tablet 12.5 mg  12.5 mg Oral Daily Hessie Knows, MD      . imipramine (TOFRANIL) tablet 25 mg  25 mg Oral Daily Hessie Knows, MD      . ketoconazole (NIZORAL) 2 % cream 1 application  1 application Topical BID PRN Hessie Knows, MD      . latanoprost (XALATAN) 0.005 % ophthalmic solution 1 drop  1 drop Both Eyes QHS Hessie Knows, MD      . magnesium citrate solution 1 Bottle  1 Bottle Oral Once PRN Hessie Knows, MD      . magnesium hydroxide (MILK OF MAGNESIA) suspension 30 mL  30 mL Oral Daily PRN Hessie Knows, MD      . magnesium oxide (MAG-OX) tablet 400 mg  400 mg Oral Daily Hessie Knows, MD      . menthol-cetylpyridinium (CEPACOL) lozenge 3 mg  1 lozenge Oral PRN Hessie Knows, MD       Or  . phenol (CHLORASEPTIC) mouth spray 1 spray  1 spray Mouth/Throat PRN Hessie Knows, MD      . methocarbamol (ROBAXIN) tablet 500 mg  500 mg Oral Q6H PRN Hessie Knows, MD   500 mg at 03/11/16 1201   Or  . methocarbamol (ROBAXIN) 500 mg in dextrose 5 % 50 mL IVPB  500 mg Intravenous Q6H PRN Hessie Knows, MD      . metoCLOPramide (REGLAN) tablet 5-10 mg  5-10 mg Oral Q8H PRN Hessie Knows, MD   10 mg at 03/11/16 1201   Or  . metoCLOPramide (REGLAN) injection 5-10 mg  5-10 mg Intravenous Q8H PRN Hessie Knows, MD      . metoprolol succinate (TOPROL-XL) 24 hr tablet 100 mg  100 mg Oral QPM Hessie Knows, MD      . morphine 2 MG/ML injection 2 mg  2 mg Intravenous Q1H PRN Hessie Knows, MD      . ondansetron [facility]) tablet 4 mg  4 mg Oral Q6H PRN Hessie Knows, MD       Or  . ondansetron  [facility]) injection 4 mg  4 mg Intravenous Q6H PRN Hessie Knows, MD      . oxyCODONE (Oxy IR/ROXICODONE) immediate release tablet 5-10  mg  5-10 mg Oral Q3H PRN Hessie Knows, MD      . oxyCODONE (Oxy IR/ROXICODONE) immediate release tablet 5-10 mg  5-10 mg Oral Q3H PRN Hessie Knows, MD   5 mg at 03/11/16 1201  . potassium chloride (K-DUR) CR tablet 10 mEq  10 mEq Oral Daily Hessie Knows, MD      . sodium chloride 0.9 % injection           . zolpidem (AMBIEN) tablet 5 mg  5 mg Oral QHS PRN Hessie Knows, MD         Discharge Medications: Please see discharge summary for a list of discharge medications.  Relevant Imaging Results:  Relevant Lab Results:   Additional Information  (SSN: [id number])  Loralyn Freshwater, LCSW

## 2016-03-11 NOTE — Progress Notes (Signed)
250cc of ns given for low blood pressure  80/56   Applied immobilizer and avi boots   Elevated left arm on pillows due to pain in upper left arm  Arching  Repositioned for comfort    Pt states she has arthritis

## 2016-03-11 NOTE — Progress Notes (Signed)
Called dr Kayleen Memos for low blood pressure  85/60  Ephedrine to be given

## 2016-03-11 NOTE — Care Management Note (Addendum)
Case Management Note  Patient Details  Name: MARZELL ALLEMAND MRN: [id number] Date of Birth: April 20, 1946  Subjective/Objective:                  Met with patient and her spouse to discuss discharge planning. She plans to return home at discharge. She has used [facility] in the past and would like to use them again for PT at home. She has a rolling walker and shower chair available at home for use. [facility] [phone].  Action/Plan: List of home health agencies left with patient. Referral to [facility]. Lovenox 46m #14 called  [facility] for price. RNCM will continue to follow.   Expected Discharge Date:                  Expected Discharge Plan:     In-House Referral:     Discharge planning Services     Post Acute Care Choice:  Home Health Choice offered to:  Patient, Spouse  DME Arranged:    DME Agency:     [place]  [place]  [facility]  Status of Service:  In process, will continue to follow  If discussed at Long Length of Stay Meetings, dates discussed:    Additional Comments: Lovenox $0.00.   AMarshell Garfinkel RN 03/11/2016, 12:12 PM

## 2016-03-11 NOTE — H&P (Signed)
Reviewed paper H+P, will be scanned into chart. No changes noted.  

## 2016-03-11 NOTE — Progress Notes (Signed)
PT Cancellation Note  Patient Details Name: Becky Trevino MRN: [id number] DOB: 1946/02/03   Cancelled Treatment:    Reason Eval/Treat Not Completed: Other (comment). Chart reviewed and evaluation attempted. Pt currently refuses despite copious attempts for participation. Pt reports she doesn't feel good and wishes to begin therapy tomorrow. Will re-attempt next date.   Geniva Lohnes 03/11/2016, 3:24 PM  Greggory Stallion, PT, DPT [phone]

## 2016-03-12 ENCOUNTER — Encounter: Payer: Self-pay | Admitting: Orthopedic Surgery

## 2016-03-12 LAB — BASIC METABOLIC PANEL
ANION GAP: 6 (ref 5–15)
BUN: 19 mg/dL (ref 6–20)
CHLORIDE: 105 mmol/L (ref 101–111)
CO2: 23 mmol/L (ref 22–32)
Calcium: 8.1 mg/dL — ABNORMAL LOW (ref 8.9–10.3)
Creatinine, Ser: 1.16 mg/dL — ABNORMAL HIGH (ref 0.44–1.00)
GFR calc Af Amer: 54 mL/min — ABNORMAL LOW (ref >60)
GFR calc non Af Amer: 47 mL/min — ABNORMAL LOW (ref >60)
GLUCOSE: 127 mg/dL — AB (ref 65–99)
POTASSIUM: 4.4 mmol/L (ref 3.5–5.1)
Sodium: 134 mmol/L — ABNORMAL LOW (ref 135–145)

## 2016-03-12 LAB — CBC
HEMATOCRIT: 32.3 % — AB (ref 35.0–47.0)
HEMOGLOBIN: 10.8 g/dL — AB (ref 12.0–16.0)
MCH: 31.2 pg (ref 26.0–34.0)
MCHC: 33.4 g/dL (ref 32.0–36.0)
MCV: 93.2 fL (ref 80.0–100.0)
Platelets: 140 10*3/uL — ABNORMAL LOW (ref 150–440)
RBC: 3.47 MIL/uL — AB (ref 3.80–5.20)
RDW: 17.1 % — ABNORMAL HIGH (ref 11.5–14.5)
WBC: 9.7 10*3/uL (ref 3.6–11.0)

## 2016-03-12 MED ORDER — OXYCODONE HCL 5 MG PO TABS: 5.0000 mg | ORAL_TABLET | ORAL | Status: DC | PRN

## 2016-03-12 MED ORDER — ENOXAPARIN SODIUM 40 MG/0.4ML ~~LOC~~ SOLN: 40.0000 mg | Freq: Two times a day (BID) | SUBCUTANEOUS | Status: DC

## 2016-03-12 MED ORDER — TRAMADOL HCL 50 MG PO TABS
50.0000 mg | ORAL_TABLET | ORAL | Status: DC | PRN
  Administered 2016-03-12: 50 mg via ORAL
  Filled 2016-03-12: qty 1

## 2016-03-12 NOTE — Progress Notes (Signed)
Physical Therapy Treatment Patient Details Name: Becky Trevino MRN: [id number] DOB: 03-Aug-1946 Today's Date: 03/12/2016    History of Present Illness Pt admitted for a Right TKR. Pt. has a history of L TKR, and THR.    PT Comments    Pt is making good progress towards goals with increased ambulation distance this date. Pt still has difficulty with sit<>stand and needs + 2 assist. Still required KI at this time. Good progress with there-ex this date. Pt remains motivated to perform therapy.  Follow Up Recommendations  SNF     Equipment Recommendations  3in1 (PT)    Recommendations for Other Services       Precautions / Restrictions Precautions Precautions: Knee;Fall Precaution Booklet Issued: No Restrictions Weight Bearing Restrictions: Yes RLE Weight Bearing: Weight bearing as tolerated    Mobility  Bed Mobility Overal bed mobility: Needs Assistance Bed Mobility: Sit to Supine       Sit to supine: Min assist   General bed mobility comments: assist for sequencing and assist for holding R foot and bringing leg up to bed. Min assist then given for scooting up in bed.  Transfers Overall transfer level: Needs assistance Equipment used: Rolling walker (2 wheeled) Transfers: Sit to/from Stand Sit to Stand: Mod assist;+2 physical assistance         General transfer comment: transfers performed with mod assist +2 and rw. Pt cued for sequencing and pushing from seated surface. Once standing, pt able to stand with upright posture.  Ambulation/Gait Ambulation/Gait assistance: Min assist Ambulation Distance (Feet): 40 Feet Assistive device: Rolling walker (2 wheeled) Gait Pattern/deviations: Step-to pattern     General Gait Details: ambulated using rw and KI. Pt with slow step to gait pattern with antalgic gait. Pt fatigues with increased distance, and request to turn around. Cues given for fluid gait pattern. RW adjusted to pt height.   Stairs             Wheelchair Mobility    Modified Rankin (Stroke Patients Only)       Balance                                    Cognition Arousal/Alertness: Awake/alert Behavior During Therapy: WFL for tasks assessed/performed Overall Cognitive Status: Within Functional Limits for tasks assessed                      Exercises Other Exercises Other Exercises: supine therex performed on R LE including ankle pumps, quad sets, SLRs, SAQ, and hip abd/add. All ther-ex performed x 10 reps with min assist. Other Exercises: Pt ambulated to [facility] with min assist +2 for transfer and equipment management. Safe technique performed. Mod assist required for hygiene.    General Comments        Pertinent Vitals/Pain Pain Assessment: 0-10 Pain Score: 9  Pain Location: R knee Pain Descriptors / Indicators: Operative site guarding Pain Intervention(s): Limited activity within patient's tolerance    Home Living                      Prior Function            PT Goals (current goals can now be found in the care plan section) Acute Rehab PT Goals Patient Stated Goal: To regain independence PT Goal Formulation: With patient Time For Goal Achievement: 03/26/16 Potential to Achieve Goals: Good Progress towards PT  goals: Progressing toward goals    Frequency  BID    PT Plan Current plan remains appropriate    Co-evaluation             End of Session Equipment Utilized During Treatment: Gait belt;Right knee immobilizer Activity Tolerance: Patient limited by pain;Patient limited by fatigue Patient left: in bed;with bed alarm set     Time: 1310-1349 PT Time Calculation (min) (ACUTE ONLY): 39 min  Charges:  $Gait Training: 8-22 mins $Therapeutic Exercise: 8-22 mins $Therapeutic Activity: 8-22 mins                    G Codes:      Render Marley 03/28/16, 4:16 PM  Greggory Stallion, PT, DPT [phone]

## 2016-03-12 NOTE — Evaluation (Signed)
Physical Therapy Evaluation Patient Details Name: Becky CellaJudy C Trevino MRN: [id number] DOB: December 30, 1945 Today's Date: 03/12/2016   History of Present Illness  Pt admitted for R TKR. Pt with history of L TKR/THR in 2011 and 2017 respectively.   Clinical Impression  Pt is a pleasant 70 year old female who was admitted for R TKR. Pt performs bed mobility with min assist, transfers with max assist +2, and ambulation with min assist +2. Pt unable to perform SLRs with independence, therefore KI donned for all mobility. Pt also has wound vac. Pt demonstrates deficits with strength/pain/mobility. Pt motivated to perform therapy. Would benefit from skilled PT to address above deficits and promote optimal return to PLOF; recommend transition to STR upon discharge from acute hospitalization.       Follow Up Recommendations SNF    Equipment Recommendations  3in1 (PT)    Recommendations for Other Services       Precautions / Restrictions Precautions Precautions: Knee;Fall Precaution Booklet Issued: No Restrictions Weight Bearing Restrictions: Yes RLE Weight Bearing: Weight bearing as tolerated      Mobility  Bed Mobility Overal bed mobility: Needs Assistance Bed Mobility: Supine to Sit     Supine to sit: Min assist     General bed mobility comments: assist for sequencing and scooting out towards EOB. Once seated at EOB, Pt able to sit with supervision and demonstrate upright posture. KI donned for mobility.  Transfers Overall transfer level: Needs assistance Equipment used: Rolling walker (2 wheeled) Transfers: Sit to/from Stand Sit to Stand: Max assist;+2 physical assistance         General transfer comment: first attempt for transfer with max assist of 1 and rw. Pt unable to lift buttocks off of raised bed. 2nd attempt with +2 with max assist. Once standing, pt with post leaning noted, needing mod assist for correction. Pt able to progress to min assist. Cues to slide R LE under  body and advance the RW to improve upright stance.  Ambulation/Gait Ambulation/Gait assistance: Min assist;+2 physical assistance Ambulation Distance (Feet): 15 Feet Assistive device: Rolling walker (2 wheeled) Gait Pattern/deviations: Step-to pattern     General Gait Details: ambulated using RW and +2 for safety and equipment. Pt with slow step to gait pattern with antalgic gait. Pt fatigues quickly reporting B UE weakness. KI donned prior to mobility. Pt needs cues for sequencing of gait pattern.  Stairs            Wheelchair Mobility    Modified Rankin (Stroke Patients Only)       Balance Overall balance assessment: Needs assistance Sitting-balance support: Feet supported Sitting balance-Leahy Scale: Good     Standing balance support: Bilateral upper extremity supported Standing balance-Leahy Scale: Fair                               Pertinent Vitals/Pain Pain Assessment: 0-10 Pain Score: 8  Pain Location: R knee Pain Descriptors / Indicators: Operative site guarding Pain Intervention(s): Limited activity within patient's tolerance;Premedicated before session;Ice applied    Home Living Family/patient expects to be discharged to:: Private residence Living Arrangements: Spouse/significant other Available Help at Discharge: Family Type of Home: House Home Access: Level entry     Home Layout: One level Home Equipment: Environmental consultantWalker - 2 wheels;Cane - single point      Prior Function Level of Independence: Independent         Comments: I with ADLs, ambulation and driving  Hand Dominance   Dominant Hand: Right    Extremity/Trunk Assessment   Upper Extremity Assessment: Generalized weakness (B UE grossly 3+/5)           Lower Extremity Assessment: Generalized weakness (R LE grossly 2/5; L LE grossly 3+/5)         Communication   Communication: No difficulties  Cognition Arousal/Alertness: Awake/alert Behavior During Therapy: WFL  for tasks assessed/performed Overall Cognitive Status: Within Functional Limits for tasks assessed                      General Comments      Exercises Total Joint Exercises Goniometric ROM: R LE AAROM 4-78 degrees Other Exercises Other Exercises: Pt performed supine ther-ex on R LE including ankle pumps, quad sets, SLRs, and hip abd/add. All ther-ex performed x 10 reps with min/mod assist.      Assessment/Plan    PT Assessment Patient needs continued PT services  PT Diagnosis Difficulty walking;Generalized weakness;Acute pain;Abnormality of gait   PT Problem List Decreased strength;Decreased activity tolerance;Decreased range of motion;Decreased mobility;Decreased knowledge of use of DME;Pain  PT Treatment Interventions Gait training;DME instruction;Therapeutic exercise   PT Goals (Current goals can be found in the Care Plan section) Acute Rehab PT Goals Patient Stated Goal: to go home PT Goal Formulation: With patient Time For Goal Achievement: 03/26/16 Potential to Achieve Goals: Good    Frequency BID   Barriers to discharge        Co-evaluation               End of Session Equipment Utilized During Treatment: Gait belt;Right knee immobilizer Activity Tolerance: Patient limited by pain;Patient limited by fatigue Patient left: in chair;with chair alarm set;with nursing/sitter in room;with family/visitor present;with SCD's reapplied Nurse Communication: Mobility status         Time: [id number] PT Time Calculation (min) (ACUTE ONLY): 41 min   Charges:   PT Evaluation $PT Eval Moderate Complexity: 1 Procedure PT Treatments $Therapeutic Exercise: 8-22 mins   PT G Codes:        Camellia Popescu 29-Mar-2016, 10:40 AM  Greggory Stallion, PT, DPT [phone]

## 2016-03-12 NOTE — Clinical Social Work Note (Signed)
Clinical Social Work Assessment  Patient Details  Name: Becky Trevino MRN: [id number] Date of Birth: November 14, 1945  Date of referral:  03/12/16               Reason for consult:  Facility Placement                Permission sought to share information with:  Chartered certified accountant granted to share information::  Yes, Verbal Permission Granted  Name::      [address]::   [place]   Relationship::     Contact Information:     Housing/Transportation Living arrangements for the past 2 months:  [place] of Information:  Patient Patient Interpreter Needed:  None Criminal Activity/Legal Involvement Pertinent to Current Situation/Hospitalization:  No - Comment as needed Significant Relationships:  Adult Children, Spouse Lives with:  Spouse Do you feel safe going back to the place where you live?  Yes Need for family participation in patient care:  Yes (Comment)  Care giving concerns:  Patient lives with her husband Mingo Amber and daughter Ronny Bacon in [place].    Social Worker assessment / plan: [place] representative (CSW) received SNF consult. PT is recommending SNF. CSW met with patient alone at bedside. Patient was alert and oriented and was sitting up in the chair. CSW introduced self and explained role of CSW department. Per patient she lives in [place] with her husband and daughter who is a Quarry manager. Per patient her Lorella Nimrod is her primary insurance because she still works. CSW explained SNF process. Patient is agreeable to SNF however she prefers to go home if possible.   FL2 complete and faxed out. CSW presented bed offers. Patient chose [facility]. Kim admissions coordinator at [facility] started [employer] authorization today and received authorization today. CSW made patient aware that [facility] authorization has been received for [facility]. CSW will continue to follow and assist as needed.   Employment status:  English as a second language teacher:  Managed Care PT Recommendations:  [place] / Referral to community resources:  [place]  Patient/Family's Response to care:  Patient is agreeable to going to [employer]   Patient/Family's Understanding of and Emotional Response to Diagnosis, Current Treatment, and Prognosis:  Patient was pleasant and thanked CSW for visit.   Emotional Assessment Appearance:  Appears stated age Attitude/Demeanor/Rapport:    Affect (typically observed):  Accepting, Adaptable, Pleasant Orientation:  Oriented to Self, Oriented to Place, Oriented to  Time, Oriented to Situation Alcohol / Substance use:  Not Applicable Psych involvement (Current and /or in the community):  No (Comment)  Discharge Needs  Concerns to be addressed:  Discharge Planning Concerns Readmission within the last 30 days:  No Current discharge risk:  Dependent with Mobility Barriers to Discharge:  Continued Medical Work up   Loralyn Freshwater, LCSW 03/12/2016, 1:55 PM

## 2016-03-12 NOTE — Clinical Social Work Placement (Signed)
   CLINICAL SOCIAL WORK PLACEMENT  NOTE  Date:  03/12/2016  Patient Details  Name: Becky Trevino MRN: [id number] Date of Birth: [birth year]  Clinical Social Work is seeking post-discharge placement for this patient at the [place] level of care (*CSW will initial, date and re-position this form in  chart as items are completed):  Yes   Patient/family provided with [place] Work Department's list of facilities offering this level of care within the geographic area requested by the patient (or if unable, by the patient's family).  Yes   Patient/family informed of their freedom to choose among providers that offer the needed level of care, that participate in Medicare, Medicaid or managed care program needed by the patient, have an available bed and are willing to accept the patient.  Yes   Patient/family informed of [facility]'s ownership interest in [facility] and [facility], as well as of the fact that they are under no obligation to receive care at these facilities.  PASRR submitted to EDS on 03/12/16     PASRR number received on 03/12/16     Existing PASRR number confirmed on       FL2 transmitted to all facilities in geographic area requested by pt/family on 03/12/16     FL2 transmitted to all facilities within larger geographic area on       Patient informed that his/her managed care company has contracts with or will negotiate with certain facilities, including the following:        Yes   Patient/family informed of bed offers received.  Patient chooses bed at  [facility] )     Physician recommends and patient chooses bed at      Patient to be transferred to   on  .  Patient to be transferred to facility by       Patient family notified on   of transfer.  Name of family member notified:        PHYSICIAN       Additional Comment:    _______________________________________________ Loralyn Freshwater,  LCSW 03/12/2016, 1:53 PM

## 2016-03-12 NOTE — Progress Notes (Signed)
  Subjective: 1 Day Post-Op Procedure(s) (LRB): TOTAL KNEE ARTHROPLASTY (Right) Patient reports pain as moderate.   Patient seen in rounds with Dr. Rudene Christians. Patient is well, and has had no acute complaints or problems Plan is to go Home after hospital stay. Negative for chest pain and shortness of breath Fever: no Gastrointestinal:negative for nausea and vomiting  Objective: Vital signs in last 24 hours: Temp:  [96.1 F (35.6 C)-98.3 F (36.8 C)] 98.3 F (36.8 C) (06/28 0342) Pulse Rate:  [62-107] 93 (06/28 0342) Resp:  [11-20] 19 (06/28 0342) BP: (85-114)/(55-70) 108/63 mmHg (06/28 0342) SpO2:  [94 %-100 %] 94 % (06/28 0342) Weight:  [118.933 kg (262 lb 3.2 oz)] 118.933 kg (262 lb 3.2 oz) (06/27 1552)  Intake/Output from previous day:  Intake/Output Summary (Last 24 hours) at 03/12/16 0706 Last data filed at 03/12/16 0500  Gross per 24 hour  Intake 3802.92 ml  Output    995 ml  Net 2807.92 ml    Intake/Output this shift:    Labs:  Recent Labs  03/11/16 1213 03/12/16 0353  HGB 10.2* 10.8*    Recent Labs  03/11/16 1213 03/12/16 0353  WBC 6.9 9.7  RBC 3.26* 3.47*  HCT 29.8* 32.3*  PLT 131* 140*    Recent Labs  03/11/16 1213 03/12/16 0353  NA  --  134*  K  --  4.4  CL  --  105  CO2  --  23  BUN  --  19  CREATININE 1.15* 1.16*  GLUCOSE  --  127*  CALCIUM  --  8.1*   No results for input(s): LABPT, INR in the last 72 hours.   EXAM General - Patient is Alert and Oriented Extremity - Neurovascular intact Dorsiflexion/Plantar flexion intact Compartment soft Dressing/Incision - clean, dry, woundvac intact. Motor Function - intact, moving foot and toes well on exam.   Past Medical History  Diagnosis Date  . Hyperlipidemia   . Hypertension   . Diabetes mellitus without complication ([place])   . Obesity   . Leg varices   . Intertrigo   . DDD (degenerative disc disease), cervical     [facility]  . Symptomatic menopausal or female climacteric  states   . Cervical spondylosis   . OSA (obstructive sleep apnea)   . Leg cramps   . Diverticulosis   . Chronic kidney disease     Stage 3    Assessment/Plan: 1 Day Post-Op Procedure(s) (LRB): TOTAL KNEE ARTHROPLASTY (Right) Active Problems:   Primary osteoarthritis of knee  Estimated body mass index is 36.59 kg/(m^2) as calculated from the following:   Height as of this encounter: 5\' 11"  (1.803 m).   Weight as of this encounter: 118.933 kg (262 lb 3.2 oz). Advance diet Up with therapy D/C IV fluids Discharge home with home health, possibly tomorrow  DVT Prophylaxis - Lovenox, Foot Pumps and TED hose Weight-Bearing as tolerated to Right leg  Reche Dixon, PA-C Orthopaedic Surgery 03/12/2016, 7:06 AM

## 2016-03-12 NOTE — Evaluation (Signed)
Occupational Therapy Evaluation Patient Details Name: Becky Trevino MRN: [id number] DOB: September 09, 1946 Today's Date: 03/12/2016    History of Present Illness Pt admitted for a Right TKR. Pt. has a history of L TKR, and THR.   Clinical Impression   Pt. Is a 70 y.o. female who was admitted for a Left TKR. Pt presents with limited ROM, Pain, weakness, and impaired functional mobility which hinder her ability to complete ADL and IADL tasks. Pt. could benefit from skilled OT services to review A/E use for LE ADLs, to review necessary home modifications, and to improve functional mobility for ADL/IADLs in order to work towards regaining Independence with ADL/IADLs.     Follow Up Recommendations  Home health OT    Equipment Recommendations       Recommendations for Other Services PT consult     Precautions / Restrictions Precautions Precautions: Knee;Fall Precaution Booklet Issued: No Restrictions Weight Bearing Restrictions: Yes RLE Weight Bearing: Weight bearing as tolerated      Mobility Bed Mobility   Transfers   Balance Overall balance assessment: Needs assistance Sitting-balance support: Feet supported Sitting balance-Leahy Scale: Good     Standing balance support: Bilateral upper extremity supported Standing balance-Leahy Scale: Fair                              ADL Overall ADL's : Needs assistance/impaired Eating/Feeding: Set up   Grooming: Set up               Lower Body Dressing: Maximal assistance               Functional mobility during ADLs: Maximal assistance (Pt. limited by 9/10 pain)       Vision     Perception     Praxis      Pertinent Vitals/Pain Pain Assessment: 0-10 Pain Score: 9  Pain Location: R knee Pain Descriptors / Indicators: Operative site guarding Pain Intervention(s): Limited activity within patient's tolerance;Premedicated before session;Ice applied     Hand Dominance Right   Extremity/Trunk  Assessment Upper Extremity Assessment Upper Extremity Assessment: Generalized weakness (Arthritis in bilateral shoulders)       Communication Communication Communication: No difficulties   Cognition Arousal/Alertness: Awake/alert Behavior During Therapy: WFL for tasks assessed/performed Overall Cognitive Status: Within Functional Limits for tasks assessed                     General Comments       Exercises    Shoulder Instructions      Home Living Family/patient expects to be discharged to:: Private residence Living Arrangements: Spouse/significant other Available Help at Discharge: Family Type of Home: House Home Access: Level entry     Home Layout: One level     Bathroom Shower/Tub: Tub/shower unit;Curtain Shower/tub characteristics: Architectural technologist: Standard     Home Equipment: Environmental consultant - 2 wheels;Cane - single point          Prior Functioning/Environment Level of Independence: Independent        Comments: Independent with IADLs, driving, 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    OT Diagnosis: Generalized weakness;Acute pain   OT Problem List: Decreased strength;Decreased range of motion;Decreased activity tolerance;Pain   OT Treatment/Interventions: Self-care/ADL training;Therapeutic activities;Therapeutic exercise;DME and/or AE instruction;Patient/family education    OT Goals(Current goals can be found in the care plan section) Acute Rehab OT Goals Patient Stated Goal: To regain independence OT Goal Formulation: With patient Potential to Achieve Goals:  Good  OT Frequency: Min 1X/week   Barriers to D/C:            Co-evaluation              End of Session Equipment Utilized During Treatment: Gait belt  Activity Tolerance: Patient tolerated treatment well Patient left: in chair;with call bell/phone within reach;with chair alarm set;with family/visitor present   Time: 1030-1047 OT Time Calculation (min): 17 min Charges:  OT General  Charges $OT Visit: 1 Procedure OT Evaluation $OT Eval Moderate Complexity: 1 Procedure G-Codes:    Harrel Carina, MS, OTR/L Harrel Carina 03/12/2016, 11:26 AM

## 2016-03-12 NOTE — Progress Notes (Signed)
Patient with minimal time of complaints of pain. Medicated with prn medication x 2 with good effect. Discontinued fluids due to adequate intake and urine output. Will continue to monitor.

## 2016-03-12 NOTE — Care Management (Signed)
PT is recommending SNF at this time; patient still wants to go home. She will need a bedside commode if she is able. RNCM to follow. If SNF- Lovenox will need to be discontinued with outpatient pharmacy.

## 2016-03-12 NOTE — Discharge Summary (Signed)
Physician Discharge Summary  Subjective: 3 Days Post-Op Procedure(s) (LRB): TOTAL KNEE ARTHROPLASTY (Right) Patient reports pain as moderate.   Patient seen in rounds with Dr. Rudene Christians. Patient is well, and has had no acute complaints or problems Patient is ready to go to rehab at [facility]    Physician Discharge Summary  Patient ID: Becky Trevino MRN: [id number] DOB/AGE: 03/10/1946 70 y.o.  Admit date: 03/11/2016 Discharge date: 03/14/2016  Admission Diagnoses:  Discharge Diagnoses:  Active Problems:   Primary osteoarthritis of knee   Discharged Condition: fair  Hospital Course: The patient is POD #2 from a Right Total Knee Replacement.  After her pain became more controlled, she worked with PT and was able to ambulate 60 feet.  She was ready to go to rehab at [facility] on 03/13/16.  Treatments: surgery:  PROCEDURE: Procedure(s): TOTAL KNEE ARTHROPLASTY (Right)  SURGEON: Laurene Footman, MD  ASSISTANTS: Marylee Floras, PA  ANESTHESIA: spinal  EBL: Total I/O In: -  Out: 275 [Urine:125; Blood:150]  BLOOD ADMINISTERED:none  DRAINS: none   LOCAL MEDICATIONS USED: MARCAINE and OTHER Exparel and morphine  SPECIMEN: Source of Specimen: Cut ends of bone and synovial biopsy sent separately for possible pigmented villonodular synovitis  DISPOSITION OF SPECIMEN: PATHOLOGY  COUNTS: YES  TOURNIQUET:  88 minutes at 300 mmHg  IMPLANTS: Medacta GMK sphere system, 4+ femur, 4 tibia, 13 mm insert, to patella with short stem on tibia, all components cemented  Discharge Exam: Blood pressure 99/59, pulse 91, temperature 98.7 F (37.1 C), temperature source Oral, resp. rate 18, height 5\' 11"  (1.803 m), weight 118.933 kg (262 lb 3.2 oz), SpO2 100 %.   Disposition: 06-Home-Health Care Svc     Medication List    TAKE these medications        acetaminophen 500 MG tablet  Commonly known as:  TYLENOL  Take 500 mg by mouth every 6 (six) hours as needed.      aspirin 81 MG chewable tablet  Chew 81 mg by mouth daily.     atorvastatin 40 MG tablet  Commonly known as:  LIPITOR  Take 1 tablet (40 mg total) by mouth every evening.     CALCIUM 600-D 600-400 MG-UNIT tablet  Generic drug:  Calcium Carbonate-Vitamin D  Take 1 tablet by mouth 2 (two) times daily.     celecoxib 200 MG capsule  Commonly known as:  CELEBREX  Take 200 mg by mouth as needed.     [employer] LANCING DEVICE Misc  1 each.     enoxaparin 40 MG/0.4ML injection  Commonly known as:  LOVENOX  Inject 0.4 mLs (40 mg total) into the skin every 12 (twelve) hours.     furosemide 20 MG tablet  Commonly known as:  LASIX  Take 20 mg by mouth daily.     gabapentin 300 MG capsule  Commonly known as:  NEURONTIN  TAKE 1 CAPSULE (300 MG TOTAL) BY MOUTH EVERY EVENING.     glucose blood test strip  Commonly known as:  ONE TOUCH ULTRA TEST  Use as instructed     hydrochlorothiazide 12.5 MG tablet  Commonly known as:  HYDRODIURIL  Take 1 tablet (12.5 mg total) by mouth daily.     HYDROcodone-acetaminophen 5-325 MG tablet  Commonly known as:  NORCO/VICODIN  Take 1 tablet by mouth 2 (two) times daily as needed.     imipramine 25 MG tablet  Commonly known as:  TOFRANIL  Take 25 mg by mouth daily.  ketoconazole 2 % cream  Commonly known as:  NIZORAL  Apply 1 application topically 2 (two) times daily as needed for irritation.     KLOR-CON 10 10 MEQ tablet  Generic drug:  potassium chloride  Take 1 tablet by mouth daily.     latanoprost 0.005 % ophthalmic solution  Commonly known as:  XALATAN  Place 1 drop into both eyes at bedtime.     Magnesium Oxide 500 MG Caps  Take 1 capsule by mouth daily.     Medical Compression Stockings Misc     metoprolol succinate 100 MG 24 hr tablet  Commonly known as:  TOPROL-XL  Take 1 tablet (100 mg total) by mouth daily. Take with or immediately following a meal.     oxyCODONE 5 MG immediate release tablet  Commonly known as:  Oxy  IR/ROXICODONE  Take 1-2 tablets (5-10 mg total) by mouth every 3 (three) hours as needed for breakthrough pain.     oxyCODONE 5 MG immediate release tablet  Commonly known as:  Oxy IR/ROXICODONE  Take 1-2 tablets (5-10 mg total) by mouth every 3 (three) hours as needed for breakthrough pain.     zolpidem 5 MG tablet  Commonly known as:  AMBIEN  Take 5 mg by mouth at bedtime as needed.       Follow-up Information    Follow up In 2 weeks.   Why:  For staple removal.  Patient already has an appointment      Follow up with [facility] .   Specialty:  [place] information:   [address] [place] [phone]      Signed: Prescott Parma, Kaysha Parsell 03/14/2016, 6:34 AM   Objective: Vital signs in last 24 hours: Temp:  [98 F (36.7 C)-98.7 F (37.1 C)] 98.7 F (37.1 C) (06/30 0452) Pulse Rate:  [89-101] 91 (06/30 0452) Resp:  [18-19] 18 (06/30 0452) BP: (95-116)/(47-60) 99/59 mmHg (06/30 0452) SpO2:  [85 %-100 %] 100 % (06/30 0452)  Intake/Output from previous day:  Intake/Output Summary (Last 24 hours) at 03/14/16 0634 Last data filed at 03/13/16 2000  Gross per 24 hour  Intake    720 ml  Output    302 ml  Net    418 ml    Intake/Output this shift:    Labs:  Recent Labs  03/11/16 1213 03/12/16 0353  HGB 10.2* 10.8*    Recent Labs  03/11/16 1213 03/12/16 0353  WBC 6.9 9.7  RBC 3.26* 3.47*  HCT 29.8* 32.3*  PLT 131* 140*    Recent Labs  03/11/16 1213 03/12/16 0353  NA  --  134*  K  --  4.4  CL  --  105  CO2  --  23  BUN  --  19  CREATININE 1.15* 1.16*  GLUCOSE  --  127*  CALCIUM  --  8.1*   No results for input(s): LABPT, INR in the last 72 hours.  EXAM: General - Patient is Alert and Oriented Extremity - Neurovascular intact Intact pulses distally Dorsiflexion/Plantar flexion intact No cellulitis present Compartment soft Incision - clean, dry, no drainage, woundvac is in place.    Motor Function -  DF/PF is intact.  Ambulated 60 feet with PT.  Assessment/Plan: 3 Days Post-Op Procedure(s) (LRB): TOTAL KNEE ARTHROPLASTY (Right) Procedure(s) (LRB): TOTAL KNEE ARTHROPLASTY (Right) Past Medical History  Diagnosis Date  . Hyperlipidemia   . Hypertension   . Diabetes mellitus without complication ([place])   .  Obesity   . Leg varices   . Intertrigo   . DDD (degenerative disc disease), cervical     [facility]  . Symptomatic menopausal or female climacteric states   . Cervical spondylosis   . OSA (obstructive sleep apnea)   . Leg cramps   . Diverticulosis   . Chronic kidney disease     Stage 3   Active Problems:   Primary osteoarthritis of knee  Estimated body mass index is 36.59 kg/(m^2) as calculated from the following:   Height as of this encounter: 5\' 11"  (1.803 m).   Weight as of this encounter: 118.933 kg (262 lb 3.2 oz). Up with therapy Discharge to SNF Diet - Regular diet Follow up - in 2 weeks Activity - WBAT Disposition - Rehab Condition Upon Discharge - Stable DVT Prophylaxis - Lovenox and TED hose  Reche Dixon, PA-C Orthopaedic Surgery 03/14/2016, 6:34 AM

## 2016-03-13 ENCOUNTER — Encounter
Admission: RE | Admit: 2016-03-13 | Discharge: 2016-03-13 | Disposition: A | Discharge status: Home / Self Care | Payer: BLUE CROSS/BLUE SHIELD | Source: Ambulatory Visit | Attending: Internal Medicine | Admitting: Internal Medicine

## 2016-03-13 NOTE — Progress Notes (Signed)
  Subjective: 2 Days Post-Op Procedure(s) (LRB): TOTAL KNEE ARTHROPLASTY (Right) Patient reports pain as moderate.   Patient seen in rounds with Dr. Rudene Christians. Patient is well, and has had no acute complaints or problems Plan is to go Home after hospital stay. The patient is planning to go home on Friday. Negative for chest pain and shortness of breath Fever: no Gastrointestinal:negative for nausea and vomiting  Objective: Vital signs in last 24 hours: Temp:  [97.6 F (36.4 C)-98.9 F (37.2 C)] 98.5 F (36.9 C) (06/29 0535) Pulse Rate:  [89-101] 89 (06/29 0535) Resp:  [16-19] 19 (06/29 0535) BP: (107-129)/(58-71) 107/58 mmHg (06/29 0535) SpO2:  [95 %-99 %] 97 % (06/29 0535)  Intake/Output from previous day:  Intake/Output Summary (Last 24 hours) at 03/13/16 0737 Last data filed at 03/13/16 0650  Gross per 24 hour  Intake 2043.33 ml  Output    600 ml  Net 1443.33 ml    Intake/Output this shift:    Labs:  Recent Labs  03/11/16 1213 03/12/16 0353  HGB 10.2* 10.8*    Recent Labs  03/11/16 1213 03/12/16 0353  WBC 6.9 9.7  RBC 3.26* 3.47*  HCT 29.8* 32.3*  PLT 131* 140*    Recent Labs  03/11/16 1213 03/12/16 0353  NA  --  134*  K  --  4.4  CL  --  105  CO2  --  23  BUN  --  19  CREATININE 1.15* 1.16*  GLUCOSE  --  127*  CALCIUM  --  8.1*   No results for input(s): LABPT, INR in the last 72 hours.   EXAM General - Patient is Alert and Oriented Extremity - Neurovascular intact Dorsiflexion/Plantar flexion intact Compartment soft Dressing/Incision - clean, dry, woundvac Is leaking. Tegaderm was applied on either side to reinforce.. Motor Function - intact, moving foot and toes well on exam. The patient ambulated 40 feet with physical therapy.  Past Medical History  Diagnosis Date  . Hyperlipidemia   . Hypertension   . Diabetes mellitus without complication ([place])   . Obesity   . Leg varices   . Intertrigo   . DDD (degenerative disc disease),  cervical     [facility]  . Symptomatic menopausal or female climacteric states   . Cervical spondylosis   . OSA (obstructive sleep apnea)   . Leg cramps   . Diverticulosis   . Chronic kidney disease     Stage 3    Assessment/Plan: 2 Days Post-Op Procedure(s) (LRB): TOTAL KNEE ARTHROPLASTY (Right) Active Problems:   Primary osteoarthritis of knee  Estimated body mass index is 36.59 kg/(m^2) as calculated from the following:   Height as of this encounter: 5\' 11"  (1.803 m).   Weight as of this encounter: 118.933 kg (262 lb 3.2 oz). Advance diet Up with therapy D/C IV fluids Discharge home with home health, the plan is on Friday. The patient follow up in 2 weeks for staple removal. The patient will have her wound VAC removed by home health physical therapy in 1 week.  DVT Prophylaxis - Lovenox, Foot Pumps and TED hose Weight-Bearing as tolerated to Right leg  Reche Dixon, PA-C Orthopaedic Surgery 03/13/2016, 7:37 AM

## 2016-03-13 NOTE — Progress Notes (Signed)
Occupational Therapy Treatment Patient Details Name: Becky Trevino MRN: [id number] DOB: 05/10/1946 Today's Date: 03/13/2016    History of present illness Pt admitted for a Right TKR. Pt. has a history of L TKR, and THR.   OT comments  Reviewed adaptive equipment for LB Dressing and attempted to sit EOB but complained of fatigue and stated that she did not feel up to moving and getting out of bed again.  Discussed set up of home in bathroom and she has 2 BSC with one over her toilet and one bedside.  She also has a shower chair but no grab bars.  Rec use of a walker bag and elastic shoe laces if mobility continued to be limited.  She has hx of cervical fusion at 1-3. She works at [employer] at [employer] location in [place] area. Pt is to go home tomorrow and has a daughter who is a Hospice CNA and a sister and her husband who can help her.  Concerned about how much help she will need since she requires mod assist of 2 for functional mobility per PT.  Rec OT HH.   Follow Up Recommendations  Home health OT    Equipment Recommendations       Recommendations for Other Services      Precautions / Restrictions Precautions Precautions: Knee;Fall Precaution Booklet Issued: No Restrictions Weight Bearing Restrictions: Yes RLE Weight Bearing: Weight bearing as tolerated       Mobility Bed Mobility Overal bed mobility: Needs Assistance;+2 for physical assistance Bed Mobility: Supine to Sit     Supine to sit: Min assist     General bed mobility comments: assist for bed mobility with cues for sequencing. Needed additional person to assist with scooting pt out towards EOB. Once seated at EOB, pt able to sit with safe technique. Takes increased time to perform transfer to EOB.  Transfers Overall transfer level: Needs assistance Equipment used: Rolling walker (2 wheeled) Transfers: Sit to/from Stand Sit to Stand: Mod assist;+2 physical assistance         General transfer comment:  Pt performed transfers from elevated bed. +2 assist used assistance and momentum required. Once standing, pt with slight post leaning, however able to self correct with min assist and then progress to supervision.    Balance                                   ADL Overall ADL's : Needs assistance/impaired                                       General ADL Comments: reviewed adaptive equipment for LB Dressing and attempted to sit EOB but complained of fatigue and stated that she did not feel up to moving and getting out of bed again.  Discussed set up of home in bathroom and she has 2 BSC with one over her toilet and one bedside.  She also has a shower chair but no grab bars.  Rec use of a walker bag and elastic shoe laces if mobility continued to be limited.  She has hx of cervical fusion at 1-3.        Vision                     Perception     Praxis  Cognition   Behavior During Therapy: WFL for tasks assessed/performed Overall Cognitive Status: Within Functional Limits for tasks assessed                       Extremity/Trunk Assessment               Exercises Total Joint Exercises Goniometric ROM: R knee AAROM- 2-78 degrees Other Exercises Other Exercises: supine therex performed on R LE including ankle pumps, quad sets, SLRs, SAQ, and hip abd/add. All ther-ex performed x 12 reps with mod assist. Other Exercises: Pt performed transfer to [facility] with mod assist for transfer and then min assist for hygiene from RN while therapist assisted with balance.   Shoulder Instructions       General Comments      Pertinent Vitals/ Pain       Pain Assessment: 0-10 Pain Score: 6  Pain Location: R knee Pain Descriptors / Indicators: Aching Pain Intervention(s): Limited activity within patient's tolerance;Monitored during session;Premedicated before session  Home Living                                           Prior Functioning/Environment              Frequency Min 1X/week     Progress Toward Goals  OT Goals(current goals can now be found in the care plan section)  Progress towards OT goals: Progressing toward goals  Acute Rehab OT Goals Patient Stated Goal: To regain independence OT Goal Formulation: With patient Potential to Achieve Goals: Good  Plan Discharge plan remains appropriate    Co-evaluation                 End of Session     Activity Tolerance Patient limited by fatigue   Patient Left in bed;with call bell/phone within reach;with bed alarm set   Nurse Communication          Time: 1710-1730 OT Time Calculation (min): 20 min  Charges: OT General Charges $OT Visit: 1 Procedure OT Treatments $Self Care/Home Management : 8-22 mins   Chrys Racer, OTR/L ascom [phone]  03/13/2016, 5:32 PM

## 2016-03-13 NOTE — Progress Notes (Signed)
Physical Therapy Treatment Patient Details Name: Becky Trevino MRN: [id number] DOB: 01-14-1946 Today's Date: 03/13/2016    History of Present Illness Pt admitted for a Right TKR. Pt. has a history of L TKR, and THR.    PT Comments    Pt able to participate with therapy this AM session. Pt with hypotension, therefore OOB mobility not performed. BP checked twice with reading of 95/47 followed by 98/60. Pt able to participate in supine there-ex, however requires increased assistance compared to previous date. Slight progress noted in AAROM of R knee. Pt with 85% O2 sats on room air upon arrival, however was able to perform 10 reps of incentive spirometer with improvement to 100% on room air. RN notified. Will plan OOB mobility in PM session.  Follow Up Recommendations  SNF     Equipment Recommendations  3in1 (PT)    Recommendations for Other Services       Precautions / Restrictions Precautions Precautions: Knee;Fall Precaution Booklet Issued: No Restrictions Weight Bearing Restrictions: Yes RLE Weight Bearing: Weight bearing as tolerated    Mobility  Bed Mobility               General bed mobility comments: unable to perform mobility as pt with low BP and low O2 sats. Deferred this AM session  Transfers                    Ambulation/Gait                 Stairs            Wheelchair Mobility    Modified Rankin (Stroke Patients Only)       Balance                                    Cognition Arousal/Alertness: Awake/alert Behavior During Therapy: WFL for tasks assessed/performed Overall Cognitive Status: Within Functional Limits for tasks assessed                      Exercises Total Joint Exercises Goniometric ROM: R knee AAROM- 2-78 degrees Other Exercises Other Exercises: supine therex performed on R LE including ankle pumps, quad sets, SLRs, SAQ, heel slides and hip abd/add. All ther-ex performed x 12  reps with mod assist. Other Exercises: Pt requesting to use bed pan, RN and Aide notified.    General Comments        Pertinent Vitals/Pain Pain Assessment: 0-10 Pain Score: 8  Pain Location: R knee Pain Descriptors / Indicators: Operative site guarding Pain Intervention(s): Limited activity within patient's tolerance    Home Living                      Prior Function            PT Goals (current goals can now be found in the care plan section) Acute Rehab PT Goals Patient Stated Goal: To regain independence PT Goal Formulation: With patient Time For Goal Achievement: 03/26/16 Potential to Achieve Goals: Good Progress towards PT goals: Progressing toward goals    Frequency  BID    PT Plan Current plan remains appropriate    Co-evaluation             End of Session Equipment Utilized During Treatment: Gait belt Activity Tolerance: Treatment limited secondary to medical complications (Comment);Patient limited by pain Patient left: in bed;with  bed alarm set     Time: 1010-1040 PT Time Calculation (min) (ACUTE ONLY): 30 min  Charges:  $Therapeutic Exercise: 23-37 mins                    G Codes:      Jadavion Spoelstra 03-18-16, 3:24 PM  Greggory Stallion, PT, DPT [phone]

## 2016-03-13 NOTE — Progress Notes (Signed)
Pt hypotensive this AM, fluids restarted. Did not participate with PT due to low BP and low O@ sat (pt was sleeping). VS improved this afternoon. Instruction/education provided with incentive spirometer, O2 sats improved.

## 2016-03-13 NOTE — Progress Notes (Signed)
Physical Therapy Treatment Patient Details Name: Becky Trevino MRN: [id number] DOB: 01-09-1946 Today's Date: 03/13/2016    History of Present Illness Pt admitted for a Right TKR. Pt. has a history of L TKR, and THR.    PT Comments    Pt is making good progress towards goals with increased ambulation performed this session. Pt however quickly fatigues and needs assist for OOB mobility. KI donned for all OOB mobility as pt still severely weak in R LE. +2 assist for transfers required and toileting tasks, however only needs +1 for mobility. Good endurance noted with there-ex. Still complains of 7/10 pain this PM session.  Follow Up Recommendations  SNF     Equipment Recommendations  3in1 (PT)    Recommendations for Other Services       Precautions / Restrictions Precautions Precautions: Knee;Fall Precaution Booklet Issued: No Restrictions Weight Bearing Restrictions: Yes RLE Weight Bearing: Weight bearing as tolerated    Mobility  Bed Mobility Overal bed mobility: Needs Assistance;+2 for physical assistance Bed Mobility: Supine to Sit     Supine to sit: Min assist     General bed mobility comments: assist for bed mobility with cues for sequencing. Needed additional person to assist with scooting pt out towards EOB. Once seated at EOB, pt able to sit with safe technique. Takes increased time to perform transfer to EOB.  Transfers Overall transfer level: Needs assistance Equipment used: Rolling walker (2 wheeled) Transfers: Sit to/from Stand Sit to Stand: Mod assist;+2 physical assistance         General transfer comment: Pt performed transfers from elevated bed. +2 assist used assistance and momentum required. Once standing, pt with slight post leaning, however able to self correct with min assist and then progress to supervision.  Ambulation/Gait Ambulation/Gait assistance: Min assist;+2 safety/equipment Ambulation Distance (Feet): 60 Feet Assistive device:  Rolling walker (2 wheeled) Gait Pattern/deviations: Step-to pattern     General Gait Details: ambulated using rw and KI. Pt with slow step to gait pattern with inconsistent reciprocal gait. Pt request to turn around and return back to bed secondary to fatigue. Pt required 1 standing rest break during ambulation for approx 10-15 seconds. Cues given for fluid gait pattern. No reports of increased pain during ambulation.   Stairs            Wheelchair Mobility    Modified Rankin (Stroke Patients Only)       Balance                                    Cognition Arousal/Alertness: Awake/alert Behavior During Therapy: WFL for tasks assessed/performed Overall Cognitive Status: Within Functional Limits for tasks assessed                      Exercises Total Joint Exercises Goniometric ROM: R knee AAROM- 2-78 degrees Other Exercises Other Exercises: supine therex performed on R LE including ankle pumps, quad sets, SLRs, SAQ, and hip abd/add. All ther-ex performed x 12 reps with mod assist. Other Exercises: Pt performed transfer to [facility] with mod assist for transfer and then min assist for hygiene from RN while therapist assisted with balance.    General Comments        Pertinent Vitals/Pain Pain Assessment: 0-10 Pain Score: 7  Pain Location: R knee Pain Descriptors / Indicators: Operative site guarding Pain Intervention(s): Limited activity within patient's tolerance  Home Living                      Prior Function            PT Goals (current goals can now be found in the care plan section) Acute Rehab PT Goals Patient Stated Goal: To regain independence PT Goal Formulation: With patient Time For Goal Achievement: 03/26/16 Potential to Achieve Goals: Good Progress towards PT goals: Progressing toward goals    Frequency  BID    PT Plan Current plan remains appropriate    Co-evaluation             End of Session  Equipment Utilized During Treatment: Gait belt Activity Tolerance: Patient limited by fatigue Patient left: in bed;with bed alarm set     Time: 1423-1501 PT Time Calculation (min) (ACUTE ONLY): 38 min  Charges:  $Gait Training: 8-22 mins $Therapeutic Exercise: 8-22 mins $Therapeutic Activity: 8-22 mins                    G Codes:      Kyng Matlock 03-27-16, 3:45 PM  Greggory Stallion, PT, DPT [phone]

## 2016-03-13 NOTE — Plan of Care (Signed)
Problem: Activity: Goal: Ability to avoid complications of mobility impairment will improve Outcome: Progressing Up to [facility] with one assist   Problem: Pain Management: Goal: Pain level will decrease with appropriate interventions Outcome: Progressing Pain controlled with PRN medicaiton. No acute distress noted. Resting between care

## 2016-03-14 LAB — SURGICAL PATHOLOGY

## 2016-03-14 MED ORDER — BISACODYL 10 MG RE SUPP
10.0000 mg | Freq: Once | RECTAL | Status: DC | PRN
  Filled 2016-03-14: qty 1

## 2016-03-14 MED ORDER — OXYCODONE HCL 5 MG PO TABS: 5.0000 mg | ORAL_TABLET | ORAL | Status: DC | PRN

## 2016-03-14 MED ORDER — ENOXAPARIN SODIUM 40 MG/0.4ML ~~LOC~~ SOLN: 40.0000 mg | SUBCUTANEOUS | Status: DC

## 2016-03-14 NOTE — Clinical Social Work Placement (Signed)
   CLINICAL SOCIAL WORK PLACEMENT  NOTE  Date:  03/14/2016  Patient Details  Name: Becky Trevino MRN: [id number] Date of Birth: January 02, 1946  Clinical Social Work is seeking post-discharge placement for this patient at the [place] level of care (*CSW will initial, date and re-position this form in  chart as items are completed):  Yes   Patient/family provided with [place] Work Department's list of facilities offering this level of care within the geographic area requested by the patient (or if unable, by the patient's family).  Yes   Patient/family informed of their freedom to choose among providers that offer the needed level of care, that participate in Medicare, Medicaid or managed care program needed by the patient, have an available bed and are willing to accept the patient.  Yes   Patient/family informed of [place]'s ownership interest in [facility] and [facility], as well as of the fact that they are under no obligation to receive care at these facilities.  PASRR submitted to EDS on 03/12/16     PASRR number received on 03/12/16     Existing PASRR number confirmed on       FL2 transmitted to all facilities in geographic area requested by pt/family on 03/12/16     FL2 transmitted to all facilities within larger geographic area on       Patient informed that his/her managed care company has contracts with or will negotiate with certain facilities, including the following:        Yes   Patient/family informed of bed offers received.  Patient chooses bed at  [facility] )     Physician recommends and patient chooses bed at      Patient to be transferred to  ([facility]) on 03/14/16.  Patient to be transferred to facility by  (EMS)     Patient family notified on 03/14/16 of transfer.  Name of family member notified:   [facility] )     PHYSICIAN       Additional Comment:     _______________________________________________ Baldemar Lenis, LCSW 03/14/2016, 9:45 AM

## 2016-03-14 NOTE — Progress Notes (Addendum)
Pt discharged to [facility], room 210. Report called to [place]. EMS transported.

## 2016-03-14 NOTE — Progress Notes (Signed)
   Subjective: 3 Days Post-Op Procedure(s) (LRB): TOTAL KNEE ARTHROPLASTY (Right) Patient reports pain as mild.   Patient is well, and has had no acute complaints or problems Denies any CP, SOB, ABD pain. We will continue therapy today.  Plan is to go Rehab after hospital stay.  Objective: Vital signs in last 24 hours: Temp:  [98 F (36.7 C)-98.7 F (37.1 C)] 98.4 F (36.9 C) (06/30 0731) Pulse Rate:  [89-101] 98 (06/30 0731) Resp:  [18-20] 20 (06/30 0731) BP: (95-116)/(47-60) 114/55 mmHg (06/30 0731) SpO2:  [85 %-100 %] 92 % (06/30 0731)  Intake/Output from previous day: 06/29 0701 - 06/30 0700 In: 720 [P.O.:720] Out: 303 [Urine:303] Intake/Output this shift:     Recent Labs  03/11/16 1213 03/12/16 0353  HGB 10.2* 10.8*    Recent Labs  03/11/16 1213 03/12/16 0353  WBC 6.9 9.7  RBC 3.26* 3.47*  HCT 29.8* 32.3*  PLT 131* 140*    Recent Labs  03/11/16 1213 03/12/16 0353  NA  --  134*  K  --  4.4  CL  --  105  CO2  --  23  BUN  --  19  CREATININE 1.15* 1.16*  GLUCOSE  --  127*  CALCIUM  --  8.1*   No results for input(s): LABPT, INR in the last 72 hours.  EXAM General - Patient is Alert, Appropriate and Oriented Extremity - Neurovascular intact Sensation intact distally Intact pulses distally Dorsiflexion/Plantar flexion intact No cellulitis present wound vac intact, canister with minimal discharge Dressing - dressing C/D/I Motor Function - intact, moving foot and toes well on exam.   Past Medical History  Diagnosis Date  . Hyperlipidemia   . Hypertension   . Diabetes mellitus without complication ([place])   . Obesity   . Leg varices   . Intertrigo   . DDD (degenerative disc disease), cervical     [facility]  . Symptomatic menopausal or female climacteric states   . Cervical spondylosis   . OSA (obstructive sleep apnea)   . Leg cramps   . Diverticulosis   . Chronic kidney disease     Stage 3    Assessment/Plan:   3 Days  Post-Op Procedure(s) (LRB): TOTAL KNEE ARTHROPLASTY (Right) Active Problems:   Primary osteoarthritis of knee  Estimated body mass index is 36.59 kg/(m^2) as calculated from the following:   Height as of this encounter: 5\' 11"  (1.803 m).   Weight as of this encounter: 118.933 kg (262 lb 3.2 oz). Advance diet Up with therapy  Needs BM before discharge Remove and dispose of wound vac on 7/4-7/5, apply sterile dressing.  Follow up with [facility] in 2 weeks for staple removal.  DVT Prophylaxis - Lovenox, Foot Pumps and TED hose Weight-Bearing as tolerated to right leg   T. Rachelle Hora, PA-C [place] 03/14/2016, 7:39 AM

## 2016-03-14 NOTE — Progress Notes (Signed)
CSW left message for Maudie Mercury- Admissions Coordinator at [facility] to inform her patient is ready for discharge today. Awaiting phone call back.  Ernest Pine, MSW, [place], [place] Clinical Social Worker [phone]

## 2016-03-14 NOTE — Progress Notes (Signed)
Clinical Social Worker was informed that patient will be medically ready to discharge to [facility]. Patient in a agreement with plan. CSW called Maudie Mercury- Admissions Coordinator at [facility] to confirm that patient's bed is ready. Provided patient's room number 210a and number to call for report [phone] . All discharge information faxed to [facility] via [employer]. Rx's added to discharge packet. RN will call report and patient will discharge to [facility] via EMS.   Ernest Pine, MSW, [place], [place] Clinical Social Worker [phone]

## 2016-03-14 NOTE — Discharge Instructions (Signed)
TOTAL KNEE REPLACEMENT POSTOPERATIVE DIRECTIONS  Knee Rehabilitation, Guidelines Following Surgery  Results after knee surgery are often greatly improved when you follow the exercise, range of motion and muscle strengthening exercises prescribed by your doctor. Safety measures are also important to protect the knee from further injury. Any time any of these exercises cause you to have increased pain or swelling in your knee joint, decrease the amount until you are comfortable again and slowly increase them. If you have problems or questions, call your caregiver or physical therapist for advice.   HOME CARE INSTRUCTIONS  Remove items at home which could result in a fall. This includes throw rugs or furniture in walking pathways.   ICE using the Polar Care unit to the affected knee every three hours for 30 minutes at a time and then as needed for pain and swelling.  Place a dry towel or pillow case over the knee before applying the Polar Care Unit.  Continue to use ice on the knee for pain and swelling from surgery. You may notice swelling that will progress down to the foot and ankle.  This is normal after surgery.  Elevate the leg when you are not up walking on it.    Continue to use the breathing machine which will help keep your temperature down.  It is common for your temperature to cycle up and down following surgery, especially at night when you are not up moving around and exerting yourself.  The breathing machine keeps your lungs expanded and your temperature down.  Do not place pillow under knee, focus on keeping the knee straight while resting  DIET You may resume your previous home diet once your are discharged from the hospital.  DRESSING / WOUND CARE / SHOWERING Remove and dispose of wound vac on 7/4-7/5, apply sterile dressing.  Follow up with [facility] in 2 weeks for staple removal.  ACTIVITY Walk with your walker as instructed. Use walker as long as suggested by your  caregivers. Avoid periods of inactivity such as sitting longer than an hour when not asleep. This helps prevent blood clots.  You may resume a sexual relationship in one month or when given the OK by your doctor.  You may return to work once you are cleared by your doctor.  Do not drive a car for 6 weeks or until released by you surgeon.  Do not drive while taking narcotics.  WEIGHT BEARING Weight bearing as tolerated with assist device (walker, cane, etc) as directed, use it as long as suggested by your surgeon or therapist, typically at least 4-6 weeks.  POSTOPERATIVE CONSTIPATION PROTOCOL Constipation - defined medically as fewer than three stools per week and severe constipation as less than one stool per week.  One of the most common issues patients have following surgery is constipation.  Even if you have a regular bowel pattern at home, your normal regimen is likely to be disrupted due to multiple reasons following surgery.  Combination of anesthesia, postoperative narcotics, change in appetite and fluid intake all can affect your bowels.  In order to avoid complications following surgery, here are some recommendations in order to help you during your recovery period.  Colace (docusate) - Pick up an over-the-counter form of Colace or another stool softener and take twice a day as long as you are requiring postoperative pain medications.  Take with a full glass of water daily.  If you experience loose stools or diarrhea, hold the colace until you stool forms back  up.  If your symptoms do not get better within 1 week or if they get worse, check with your doctor.  Dulcolax (bisacodyl) - Pick up over-the-counter and take as directed by the product packaging as needed to assist with the movement of your bowels.  Take with a full glass of water.  Use this product as needed if not relieved by Colace only.   MiraLax (polyethylene glycol) - Pick up over-the-counter to have on hand.  MiraLax is a  solution that will increase the amount of water in your bowels to assist with bowel movements.  Take as directed and can mix with a glass of water, juice, soda, coffee, or tea.  Take if you go more than two days without a movement. Do not use MiraLax more than once per day. Call your doctor if you are still constipated or irregular after using this medication for 7 days in a row.  If you continue to have problems with postoperative constipation, please contact the office for further assistance and recommendations.  If you experience "the worst abdominal pain ever" or develop nausea or vomiting, please contact the office immediatly for further recommendations for treatment.  ITCHING  If you experience itching with your medications, try taking only a single pain pill, or even half a pain pill at a time.  You can also use Benadryl over the counter for itching or also to help with sleep.   TED HOSE STOCKINGS Wear the elastic stockings on both legs for six weeks following surgery during the day but you may remove then at night for sleeping.  MEDICATIONS See your medication summary on the After Visit Summary that the nursing staff will review with you prior to discharge.  You may have some home medications which will be placed on hold until you complete the course of blood thinner medication.  It is important for you to complete the blood thinner medication as prescribed by your surgeon.  Continue your approved medications as instructed at time of discharge.  PRECAUTIONS If you experience chest pain or shortness of breath - call 911 immediately for transfer to the hospital emergency department.  If you develop a fever greater that 101 F, purulent drainage from wound, increased redness or drainage from wound, foul odor from the wound/dressing, or calf pain - CONTACT YOUR SURGEON.                                                   FOLLOW-UP APPOINTMENTS Make sure you keep all of your appointments after  your operation with your surgeon and caregivers. You should call the office at the above phone number and make an appointment for approximately two weeks after the date of your surgery or on the date instructed by your surgeon outlined in the "After Visit Summary".   RANGE OF MOTION AND STRENGTHENING EXERCISES  Rehabilitation of the knee is important following a knee injury or an operation. After just a few days of immobilization, the muscles of the thigh which control the knee become weakened and shrink (atrophy). Knee exercises are designed to build up the tone and strength of the thigh muscles and to improve knee motion. Often times heat used for twenty to thirty minutes before working out will loosen up your tissues and help with improving the range of motion but do not use heat for  the first two weeks following surgery. These exercises can be done on a training (exercise) mat, on the floor, on a table or on a bed. Use what ever works the best and is most comfortable for you Knee exercises include:  Leg Lifts - While your knee is still immobilized in a splint or cast, you can do straight leg raises. Lift the leg to 60 degrees, hold for 3 sec, and slowly lower the leg. Repeat 10-20 times 2-3 times daily. Perform this exercise against resistance later as your knee gets better.  Quad and Hamstring Sets - Tighten up the muscle on the front of the thigh (Quad) and hold for 5-10 sec. Repeat this 10-20 times hourly. Hamstring sets are done by pushing the foot backward against an object and holding for 5-10 sec. Repeat as with quad sets.   Leg Slides: Lying on your back, slowly slide your foot toward your buttocks, bending your knee up off the floor (only go as far as is comfortable). Then slowly slide your foot back down until your leg is flat on the floor again.  Angel Wings: Lying on your back spread your legs to the side as far apart as you can without causing discomfort.  A rehabilitation program  following serious knee injuries can speed recovery and prevent re-injury in the future due to weakened muscles. Contact your doctor or a physical therapist for more information on knee rehabilitation.   IF YOU ARE TRANSFERRED TO A SKILLED REHAB FACILITY If the patient is transferred to a skilled rehab facility following release from the hospital, a list of the current medications will be sent to the facility for the patient to continue.  When discharged from the skilled rehab facility, please have the facility set up the patient's [place] prior to being released. Also, the skilled facility will be responsible for providing the patient with their medications at time of release from the facility to include their pain medication, the muscle relaxants, and their blood thinner medication. If the patient is still at the rehab facility at time of the two week follow up appointment, the skilled rehab facility will also need to assist the patient in arranging follow up appointment in our office and any transportation needs.  MAKE SURE YOU:  Understand these instructions.  Get help right away if you are not doing well or get worse.    Pick up stool softner and laxative for home use following surgery while on pain medications. Do not submerge incision under water. Please use good hand washing techniques while changing dressing each day. May shower starting three days after surgery. Please use a clean towel to pat the incision dry following showers. Continue to use ice for pain and swelling after surgery. Do not use any lotions or creams on the incision until instructed by your surgeon.

## 2016-03-14 NOTE — Progress Notes (Signed)
Occupational Therapy Treatment Patient Details Name: Becky Trevino MRN: [id number] DOB: [birth year] Today's Date: 03/14/2016    History of present illness Pt is a 70 y.o. admitted for an elective Right TKR. Pt. has a history of L TKR, and THR.   OT comments  Pt. Continues with 8/10 knee pain, weakness, limited activity tolerance, and limited functional mobility during ADLs. Pt. Continues to benefit from skilled OT services for ADL, A/E training, education about work simplification strategies, functional mobility for ADLs, and education about home modification. Pt. Plans to go to [facility] for STR once cleared for a BM.    Follow Up Recommendations  SNF with follow-up OT services.    Equipment Recommendations       Recommendations for Other Services PT consult    Precautions / Restrictions                                               ADL Overall ADL's : Needs assistance/impaired Eating/Feeding: Set up   Grooming: Set up               Lower Body Dressing: Maximal assistance                 General ADL Comments: Pt. education was provided about A/E use for LE dressing, DME, and anticipated home needs.      Vision                     Perception     Praxis      Cognition   Behavior During Therapy: WFL for tasks assessed/performed Overall Cognitive Status: Within Functional Limits for tasks assessed                       Extremity/Trunk Assessment               Exercises     Shoulder Instructions       General Comments      Pertinent Vitals/ Pain       Pain Assessment: 0-10 Pain Score: 7   Home Living Family/patient expects to be discharged to:: Private residence Living Arrangements: Spouse/significant other Available Help at Discharge: Family Type of Home: House Home Access: Level entry     Home Layout: One level     Bathroom Shower/Tub: Tub/shower unit Shower/tub characteristics:  Architectural technologist: Standard                Prior Functioning/Environment              Frequency Min 1X/week     Progress Toward Goals  OT Goals(current goals can now be found in the care plan section)        Plan Discharge plan remains appropriate    Co-evaluation                 End of Session Equipment Utilized During Treatment: Gait belt   Activity Tolerance Patient tolerated treatment well   Patient Left in chair;with call bell/phone within reach;with chair alarm set   Nurse Communication          Time: [id number] OT Time Calculation (min): 23 min  Charges: OT General Charges $OT Visit: 1 Procedure OT Treatments $Self Care/Home Management : 23-37 mins   Harrel Carina, MS, OTR/L  Harrel Carina 03/14/2016, 3:48 PM

## 2016-03-14 NOTE — Progress Notes (Signed)
Physical Therapy Treatment Patient Details Name: Becky Trevino MRN: [id number] DOB: 06/11/1946 Today's Date: 03/14/2016    History of Present Illness Pt admitted for a Right TKR. Pt. has a history of L TKR, and THR.    PT Comments    Pt is making good progress towards goals with increased ambulation distance performed. Pt remains motivated to perform therapy with progress with AAROM on R knee. Pt still with considerable weakness in R LE, requiring KI donned for all mobility. Still needs assist from elevated surfaces secondary to weakness. Pt fatigues quickly with ambulation, needing multiple standing rest breaks during ambulation.  Follow Up Recommendations  SNF     Equipment Recommendations  3in1 (PT)    Recommendations for Other Services       Precautions / Restrictions Precautions Precautions: Knee;Fall Precaution Booklet Issued: Yes (comment) Restrictions Weight Bearing Restrictions: Yes RLE Weight Bearing: Weight bearing as tolerated    Mobility  Bed Mobility               General bed mobility comments: not performed as pt received in recliner chair  Transfers Overall transfer level: Needs assistance Equipment used: Rolling walker (2 wheeled) Transfers: Sit to/from Stand Sit to Stand: Mod assist         General transfer comment: assist performed for transfers. Once standing, pt able to stand with cga. Chair elevated with pillow to assist with standing. KI donned prior to all mobility attempts  Ambulation/Gait Ambulation/Gait assistance: Min assist Ambulation Distance (Feet): 80 Feet Assistive device: Rolling walker (2 wheeled) Gait Pattern/deviations: Step-to pattern     General Gait Details: ambulated using rw and KI. Pt with step to gait pattern with difficulty advancing fluid pattern secondary to fatigue and decreased WB on R LE. Pt fatigues quickly with ambulation with 2 standing rest breaks noted.   Stairs            Wheelchair  Mobility    Modified Rankin (Stroke Patients Only)       Balance                                    Cognition Arousal/Alertness: Awake/alert Behavior During Therapy: WFL for tasks assessed/performed Overall Cognitive Status: Within Functional Limits for tasks assessed                      Exercises Total Joint Exercises Goniometric ROM: R knee AAROM: 1-90 degrees Other Exercises Other Exercises: Seated ther-ex performed on R LE including ankle pumps, quad sets, SLRs, hip abd/add, LAQ, and knee flexion stretches. All ther-ex performed x 15 reps with min/mod assist and cues for sequencing. Pt given written HEP and instructed on performance. Other Exercises: Pt ambulated to [facility] with min assist for transfer and then min assist for hygiene. Pt able to maintain static balance while therapist performed hygiene tasks.    General Comments        Pertinent Vitals/Pain Pain Assessment: 0-10 Pain Score: 7  Pain Location: R knee Pain Descriptors / Indicators: Operative site guarding Pain Intervention(s): Limited activity within patient's tolerance    Home Living                      Prior Function            PT Goals (current goals can now be found in the care plan section) Acute Rehab PT Goals Patient  Stated Goal: To regain independence PT Goal Formulation: With patient Time For Goal Achievement: 03/26/16 Potential to Achieve Goals: Good Progress towards PT goals: Progressing toward goals    Frequency  BID    PT Plan Current plan remains appropriate    Co-evaluation             End of Session Equipment Utilized During Treatment: Gait belt;Right knee immobilizer Activity Tolerance: Patient limited by fatigue Patient left: in chair;with chair alarm set     Time: [phone] PT Time Calculation (min) (ACUTE ONLY): 38 min  Charges:  $Gait Training: 8-22 mins $Therapeutic Exercise: 8-22 mins $Therapeutic Activity: 8-22 mins                     G Codes:      [facility] 03/29/2016, 11:24 AM Greggory Stallion, PT, DPT [phone]

## 2016-03-15 ENCOUNTER — Encounter
Admission: RE | Admit: 2016-03-15 | Discharge: 2016-03-15 | Disposition: A | Discharge status: Home / Self Care | Payer: BLUE CROSS/BLUE SHIELD | Source: Ambulatory Visit | Attending: Internal Medicine | Admitting: Internal Medicine

## 2016-03-17 NOTE — Anesthesia Postprocedure Evaluation (Signed)
Anesthesia Post Note  Patient: Becky Trevino  Procedure(s) Performed: Procedure(s) (LRB): TOTAL KNEE ARTHROPLASTY (Right)  Patient location during evaluation: PACU Anesthesia Type: Spinal Level of consciousness: awake and alert and oriented Pain management: pain level controlled Vital Signs Assessment: post-procedure vital signs reviewed and stable Respiratory status: spontaneous breathing Cardiovascular status: blood pressure returned to baseline Anesthetic complications: no    Last Vitals:  Filed Vitals:   03/14/16 1500 03/14/16 1614  BP: 114/54 125/58  Pulse: 97 100  Temp: 36.9 C 36.8 C  Resp: 18     Last Pain:  Filed Vitals:   03/14/16 1614  PainSc: Asleep                 Jefferey Lippmann

## 2016-03-19 DIAGNOSIS — M159 Polyosteoarthritis, unspecified: Secondary | ICD-10-CM | POA: Diagnosis not present

## 2016-03-19 DIAGNOSIS — I10 Essential (primary) hypertension: Secondary | ICD-10-CM | POA: Diagnosis not present

## 2016-03-19 DIAGNOSIS — E119 Type 2 diabetes mellitus without complications: Secondary | ICD-10-CM | POA: Diagnosis not present

## 2016-03-28 DIAGNOSIS — M25661 Stiffness of right knee, not elsewhere classified: Secondary | ICD-10-CM | POA: Diagnosis not present

## 2016-03-28 DIAGNOSIS — Z96651 Presence of right artificial knee joint: Secondary | ICD-10-CM | POA: Diagnosis not present

## 2016-03-28 DIAGNOSIS — M6281 Muscle weakness (generalized): Secondary | ICD-10-CM | POA: Diagnosis not present

## 2016-03-28 DIAGNOSIS — M25561 Pain in right knee: Secondary | ICD-10-CM | POA: Diagnosis not present

## 2016-04-01 DIAGNOSIS — Z96651 Presence of right artificial knee joint: Secondary | ICD-10-CM | POA: Diagnosis not present

## 2016-04-04 DIAGNOSIS — M25661 Stiffness of right knee, not elsewhere classified: Secondary | ICD-10-CM | POA: Diagnosis not present

## 2016-04-04 DIAGNOSIS — M25561 Pain in right knee: Secondary | ICD-10-CM | POA: Diagnosis not present

## 2016-04-04 DIAGNOSIS — Z96651 Presence of right artificial knee joint: Secondary | ICD-10-CM | POA: Diagnosis not present

## 2016-04-04 DIAGNOSIS — M6281 Muscle weakness (generalized): Secondary | ICD-10-CM | POA: Diagnosis not present

## 2016-04-07 ENCOUNTER — Other Ambulatory Visit: Payer: Self-pay | Admitting: Family Medicine

## 2016-04-08 DIAGNOSIS — Z96651 Presence of right artificial knee joint: Secondary | ICD-10-CM | POA: Diagnosis not present

## 2016-04-11 DIAGNOSIS — M25661 Stiffness of right knee, not elsewhere classified: Secondary | ICD-10-CM | POA: Diagnosis not present

## 2016-04-11 DIAGNOSIS — M25461 Effusion, right knee: Secondary | ICD-10-CM | POA: Diagnosis not present

## 2016-04-11 DIAGNOSIS — Z96651 Presence of right artificial knee joint: Secondary | ICD-10-CM | POA: Diagnosis not present

## 2016-04-11 DIAGNOSIS — M6281 Muscle weakness (generalized): Secondary | ICD-10-CM | POA: Diagnosis not present

## 2016-04-15 DIAGNOSIS — Z96651 Presence of right artificial knee joint: Secondary | ICD-10-CM | POA: Diagnosis not present

## 2016-04-16 DIAGNOSIS — Z96651 Presence of right artificial knee joint: Secondary | ICD-10-CM | POA: Diagnosis not present

## 2016-04-18 DIAGNOSIS — M6281 Muscle weakness (generalized): Secondary | ICD-10-CM | POA: Diagnosis not present

## 2016-04-18 DIAGNOSIS — Z96651 Presence of right artificial knee joint: Secondary | ICD-10-CM | POA: Diagnosis not present

## 2016-04-18 DIAGNOSIS — M25561 Pain in right knee: Secondary | ICD-10-CM | POA: Diagnosis not present

## 2016-04-18 DIAGNOSIS — M25661 Stiffness of right knee, not elsewhere classified: Secondary | ICD-10-CM | POA: Diagnosis not present

## 2016-04-22 DIAGNOSIS — Z96651 Presence of right artificial knee joint: Secondary | ICD-10-CM | POA: Diagnosis not present

## 2016-04-23 ENCOUNTER — Encounter: Payer: Self-pay | Admitting: Family Medicine

## 2016-04-23 ENCOUNTER — Ambulatory Visit (INDEPENDENT_AMBULATORY_CARE_PROVIDER_SITE_OTHER): Payer: BLUE CROSS/BLUE SHIELD | Admitting: Family Medicine

## 2016-04-23 VITALS — BP 128/78 | HR 91 | Temp 97.9°F | Resp 16 | Ht 71.0 in | Wt 249.8 lb

## 2016-04-23 DIAGNOSIS — N183 Chronic kidney disease, stage 3 unspecified: Secondary | ICD-10-CM

## 2016-04-23 DIAGNOSIS — M150 Primary generalized (osteo)arthritis: Secondary | ICD-10-CM | POA: Diagnosis not present

## 2016-04-23 DIAGNOSIS — G4700 Insomnia, unspecified: Secondary | ICD-10-CM | POA: Diagnosis not present

## 2016-04-23 DIAGNOSIS — I10 Essential (primary) hypertension: Secondary | ICD-10-CM | POA: Diagnosis not present

## 2016-04-23 DIAGNOSIS — M159 Polyosteoarthritis, unspecified: Secondary | ICD-10-CM

## 2016-04-23 DIAGNOSIS — E785 Hyperlipidemia, unspecified: Secondary | ICD-10-CM

## 2016-04-23 DIAGNOSIS — E1122 Type 2 diabetes mellitus with diabetic chronic kidney disease: Secondary | ICD-10-CM | POA: Diagnosis not present

## 2016-04-23 DIAGNOSIS — G4733 Obstructive sleep apnea (adult) (pediatric): Secondary | ICD-10-CM | POA: Diagnosis not present

## 2016-04-23 LAB — POCT UA - MICROALBUMIN: Microalbumin Ur, POC: 20 mg/L

## 2016-04-23 LAB — POCT GLYCOSYLATED HEMOGLOBIN (HGB A1C): Hemoglobin A1C: 5.4

## 2016-04-23 MED ORDER — ATORVASTATIN CALCIUM 40 MG PO TABS: 40.0000 mg | ORAL_TABLET | Freq: Every evening | ORAL | 1 refills | Status: DC

## 2016-04-23 MED ORDER — METOPROLOL SUCCINATE ER 100 MG PO TB24: 100.0000 mg | ORAL_TABLET | Freq: Every evening | ORAL | 1 refills | Status: DC

## 2016-04-23 MED ORDER — TRAZODONE HCL 50 MG PO TABS: 25.0000 mg | ORAL_TABLET | Freq: Every evening | ORAL | 0 refills | Status: DC | PRN

## 2016-04-23 NOTE — Progress Notes (Signed)
Name: Becky Trevino   MRN: [id number]    DOB: [birth year]   Date:04/23/2016       Progress Note  Subjective  Chief Complaint  Chief Complaint  Patient presents with  . Medication Refill    5 month F/U   . Diabetes    Patient checks BS once daily and states she her Low is 124 High is 227.  Marland Kitchen Hypertension    Patient had her right knee replace March 11, 2016 by Dr. Rudene Christians and after surgery her BP dropped. But since the surgery she has not experience any side effects of BP expect swelling her in her right foot.   . Hyperlipidemia  . Insomnia    Patient ran out of Ambien and has had trouble sleeping-will only sleep 4-5 hours at night.   . Cervical DDD    Stable, has days were it is stiff but other than that states she has not experienced any pain.    HPI  DMII with renal manifestation: not checking her fsbs at home, glucose has been well controlled with diet, lhbgA1C is still at goal. Urine micro was 20. Doing well, no polyphagia, polyuria or polydipsia. She has been off medication for over one year. She has gained weight recently - it happened after knee surgery in June  HTN: taking medication as prescribed very seldom has some orthostatic symptoms - dizziness when getting up quickly. No chest pain, no SOB or palpitation. We will stop HCTZ and advised to take Furosemide only prn in am's instead of qhs  Hyperlipidemia: taking Atorvastatin daily and denies side effects. No chest pain , no muscles pain  Insomnia: taking Ambien prn only and is doing well, no side effects such as nightmares, sleeping walking, no amnesia. She is out of medication and discussed other options. We will try Trazodone prn   Cervical DDD: s/p neck fusion in March 2015, still has pain and takes Gabapentin and hydrocodone only when having severe pain, however no problems recently, advised to stop narcotics and take Aleve at most twice weekly prn pain  Right knee Pain: chronic, she has OA, doing better since right  knee replacement in June by by Dr. Rudene Christians, she has some anemia after surgery, but she would like to hold off on labs at this time She is s/p left knee replacement  OSA: she uses CPAP every night, for most of the night and sometimes all night.   Obesity:she has gained almost 20 lbs , states most of it since surgery , stayed at rehab facility and was not moving around.  She is getting PT now and soon will start home exercises  Urinary incontinence: takes Elavil for symptoms, given by Dr. Jacqlyn Larsen, she is on HCTZ but we will stop that. Symptoms controlled at this time  Patient Active Problem List   Diagnosis Date Noted  . Primary osteoarthritis of knee 03/11/2016  . Primary osteoarthritis of left hip 12/11/2015  . Benign essential HTN 04/21/2015  . Edema leg 04/21/2015  . Chronic kidney disease (CKD), stage III (moderate) 04/21/2015  . Chronic venous insufficiency 04/21/2015  . DDD (degenerative disc disease), cervical 04/21/2015  . Diabetes mellitus with renal manifestation ([place]) 04/21/2015  . Dyslipidemia 04/21/2015  . Bladder cystocele 04/21/2015  . Urinary incontinence 04/21/2015  . Leg varices 04/21/2015  . Insomnia 04/21/2015  . Eczema intertrigo 04/21/2015  . Obesity, morbid ([place]) 04/21/2015  . Obstructive apnea 04/21/2015  . Menopausal and perimenopausal disorder 04/21/2015  . Supraventricular premature beats 04/21/2015  .  Osteoarthritis of multiple joints 04/21/2015  . H/O Spinal surgery 11/14/2013  . Detrusor muscle hypertonia 11/07/2013    Past Surgical History:  Procedure Laterality Date  . ABDOMINAL HYSTERECTOMY  1993   Total  . BUNIONECTOMY Bilateral 1993  . JOINT REPLACEMENT     bilateral knee  . LAMINECTOMY  11/14/2013    Cervical Fusion , [facility], Dr. Delilah Shan  . LASER ABLATION Bilateral 07/29/2012   Dr. Lucky Cowboy  . REPLACEMENT TOTAL KNEE Left 03/2010   Dr. Rudene Christians  . TOTAL HIP ARTHROPLASTY Left 12/11/2015   Procedure: TOTAL HIP ARTHROPLASTY ANTERIOR APPROACH;  Surgeon:  Hessie Knows, MD;  Location: [facility] ORS;  Service: Orthopedics;  Laterality: Left;  . TOTAL KNEE ARTHROPLASTY Right 03/11/2016   Procedure: TOTAL KNEE ARTHROPLASTY;  Surgeon: Hessie Knows, MD;  Location: [facility] ORS;  Service: Orthopedics;  Laterality: Right;    Family History  Problem Relation Age of Onset  . Diabetes Mother   . Hyperlipidemia Mother   . Hypertension Mother   . Diabetes Father   . Hyperlipidemia Father   . Hypertension Father   . Obesity Father   . Hypertension Sister   . Hyperlipidemia Sister   . Hyperlipidemia Sister     Social History   Social History  . Marital status: Married    Spouse name: N/A  . Number of children: N/A  . Years of education: N/A   Occupational History  . Not on file.   Social History Main Topics  . Smoking status: Never Smoker  . Smokeless tobacco: Never Used  . Alcohol use No  . Drug use: No  . Sexual activity: Yes    Partners: Male   Other Topics Concern  . Not on file   Social History Narrative  . No narrative on file     Current Outpatient Prescriptions:  .  acetaminophen (TYLENOL) 500 MG tablet, Take 500 mg by mouth every 6 (six) hours as needed. , Disp: , Rfl:  .  aspirin 81 MG chewable tablet, Chew 81 mg by mouth daily. , Disp: , Rfl:  .  atorvastatin (LIPITOR) 40 MG tablet, Take 1 tablet (40 mg total) by mouth every evening., Disp: 90 tablet, Rfl: 1 .  Blood Glucose Monitoring Suppl (FIFTY50 GLUCOSE METER 2.0) w/Device KIT, Use as directed., Disp: , Rfl:  .  Calcium Carbonate-Vitamin D (CALCIUM 600-D) 600-400 MG-UNIT per tablet, Take 1 tablet by mouth 2 (two) times daily., Disp: , Rfl:  .  Elastic Bandages & Supports (MEDICAL COMPRESSION STOCKINGS) MISC, , Disp: , Rfl:  .  furosemide (LASIX) 20 MG tablet, Take 20 mg by mouth daily. , Disp: , Rfl: 6 .  gabapentin (NEURONTIN) 300 MG capsule, TAKE 1 CAPSULE (300 MG TOTAL) BY MOUTH EVERY EVENING., Disp: 90 capsule, Rfl: 5 .  glucose blood (ONE TOUCH ULTRA TEST) test  strip, Use as instructed, Disp: 100 each, Rfl: 12 .  imipramine (TOFRANIL) 25 MG tablet, Take 25 mg by mouth daily. , Disp: , Rfl:  .  ketoconazole (NIZORAL) 2 % cream, Apply 1 application topically 2 (two) times daily as needed for irritation. , Disp: , Rfl:  .  KLOR-CON 10 10 MEQ tablet, Take 1 tablet by mouth daily., Disp: , Rfl: 3 .  Lancet Devices ([employer] LANCING DEVICE) MISC, 1 each., Disp: , Rfl:  .  latanoprost (XALATAN) 0.005 % ophthalmic solution, Place 1 drop into both eyes at bedtime. , Disp: , Rfl:  .  Magnesium Oxide 500 MG CAPS, Take 1 capsule by mouth  daily., Disp: , Rfl:  .  metoprolol succinate (TOPROL-XL) 100 MG 24 hr tablet, Take 1 tablet (100 mg total) by mouth every evening. Take with or immediately following a meal., Disp: 90 tablet, Rfl: 1 .  traZODone (DESYREL) 50 MG tablet, Take 0.5-1 tablets (25-50 mg total) by mouth at bedtime as needed for sleep., Disp: 30 tablet, Rfl: 0  Allergies  Allergen Reactions  . Lisinopril Swelling     ROS   Constitutional: Negative for fever, positive for weight change.  Respiratory: Negative for cough and shortness of breath.   Cardiovascular: Negative for chest pain or palpitations.  Gastrointestinal: Negative for abdominal pain, no bowel changes.  Musculoskeletal: Positive  for gait problem and mild  joint swelling.  Skin: Negative for rash.  Neurological: Negative for dizziness or headache.  No other specific complaints in a complete review of systems (except as listed in HPI above).  Objective  Vitals:   04/23/16 0924  BP: 128/78  Pulse: 91  Resp: 16  Temp: 97.9 F (36.6 C)  TempSrc: Oral  SpO2: 95%  Weight: 249 lb 12.8 oz (113.3 kg)  Height: _0  (1.803 m)    Body mass index is 34.84 kg/m.  Physical Exam  Constitutional: Patient appears well-developed and well-nourished. Obese  No distress.  HEENT: head atraumatic, normocephalic, pupils equal and reactive to light, neck supple, throat within normal  limits Cardiovascular: Normal rate, regular rhythm and normal heart sounds.  No murmur heard. 1 plus  BLE edema. Pulmonary/Chest: Effort normal and breath sounds normal. No respiratory distress. Abdominal: Soft.  There is no tenderness. Psychiatric: Patient has a normal mood and affect. behavior is normal. Judgment and thought content normal. Muscular Skeletal: grinding extension of left knee, flexion 120 degree, 180 extension   Recent Results (from the past 2160 hour(s))  CBC     Status: Abnormal   Collection Time: 02/26/16 12:30 PM  Result Value Ref Range   WBC 7.4 3.6 - 11.0 K/uL   RBC 3.87 3.80 - 5.20 MIL/uL   Hemoglobin 11.7 (L) 12.0 - 16.0 g/dL   HCT 35.7 35.0 - 47.0 %   MCV 92.0 80.0 - 100.0 fL   MCH 30.3 26.0 - 34.0 pg   MCHC 32.9 32.0 - 36.0 g/dL   RDW 16.5 (H) 11.5 - 14.5 %   Platelets 167 150 - 440 K/uL  Basic metabolic panel     Status: Abnormal   Collection Time: 02/26/16 12:30 PM  Result Value Ref Range   Sodium 139 135 - 145 mmol/L   Potassium 3.7 3.5 - 5.1 mmol/L   Chloride 103 101 - 111 mmol/L   CO2 29 22 - 32 mmol/L   Glucose, Bld 82 65 - 99 mg/dL   BUN 20 6 - 20 mg/dL   Creatinine, Ser 1.21 (H) 0.44 - 1.00 mg/dL   Calcium 9.3 8.9 - 10.3 mg/dL   GFR calc non Af Amer 44 (L) >60 mL/min   GFR calc Af Amer 51 (L) >60 mL/min    Comment: (NOTE) The eGFR has been calculated using the CKD EPI equation. This calculation has not been validated in all clinical situations. eGFR's persistently <60 mL/min signify possible Chronic Kidney Disease.    Anion gap 7 5 - 15  Protime-INR     Status: None   Collection Time: 02/26/16 12:30 PM  Result Value Ref Range   Prothrombin Time 14.6 11.4 - 15.0 seconds   INR 1.12   APTT     Status: None  Collection Time: 02/26/16 12:30 PM  Result Value Ref Range   aPTT 28 24 - 36 seconds  Urine culture     Status: Abnormal   Collection Time: 02/26/16 12:30 PM  Result Value Ref Range   Specimen Description URINE, CLEAN CATCH     Special Requests NONE    Culture (A)     <10,000 COLONIES/mL INSIGNIFICANT GROWTH Performed at [facility]    Report Status 02/28/2016 FINAL   Type and screen [place]     Status: None   Collection Time: 02/26/16 12:30 PM  Result Value Ref Range   ABO/RH(D) A POS    Antibody Screen NEG    Sample Expiration 03/11/2016    Extend sample reason NO TRANSFUSIONS OR PREGNANCY IN THE PAST 3 MONTHS   Surgical pcr screen     Status: None   Collection Time: 02/26/16 12:30 PM  Result Value Ref Range   MRSA, PCR NEGATIVE NEGATIVE   Staphylococcus aureus NEGATIVE NEGATIVE    Comment:        The Xpert SA Assay (FDA approved for NASAL specimens in patients over 73 years of age), is one component of a comprehensive surveillance program.  Test performance has been validated by [facility] for patients greater than or equal to 28 year old. It is not intended to diagnose infection nor to guide or monitor treatment.   Sedimentation rate     Status: None   Collection Time: 02/26/16 12:30 PM  Result Value Ref Range   Sed Rate 28 0 - 30 mm/hr  Urinalysis complete, with microscopic ([facility] only)     Status: Abnormal   Collection Time: 02/26/16 12:30 PM  Result Value Ref Range   Color, Urine YELLOW (A) YELLOW   APPearance CLEAR (A) CLEAR   Glucose, UA NEGATIVE NEGATIVE mg/dL   Bilirubin Urine NEGATIVE NEGATIVE   Ketones, ur NEGATIVE NEGATIVE mg/dL   Specific Gravity, Urine 1.016 1.005 - 1.030   Hgb urine dipstick 1+ (A) NEGATIVE   pH 6.0 5.0 - 8.0   Protein, ur NEGATIVE NEGATIVE mg/dL   Nitrite NEGATIVE NEGATIVE   Leukocytes, UA NEGATIVE NEGATIVE   RBC / HPF 0-5 0 - 5 RBC/hpf   WBC, UA 0-5 0 - 5 WBC/hpf   Bacteria, UA FEW (A) NONE SEEN   Squamous Epithelial / LPF 0-5 (A) NONE SEEN  Glucose, capillary     Status: None   Collection Time: 03/11/16  6:09 AM  Result Value Ref Range   Glucose-Capillary 82 65 - 99 mg/dL  Surgical pathology     Status: None    Collection Time: 03/11/16  9:24 AM  Result Value Ref Range   SURGICAL PATHOLOGY      Surgical Pathology CASE: [id number] PATIENT: Sahvannah Einspahr Surgical Pathology Report     SPECIMEN SUBMITTED: A. Knee fragments, right B. Knee synovium, possible PVNS, right  CLINICAL HISTORY: None provided  PRE-OPERATIVE DIAGNOSIS: Osteoarthritis  POST-OPERATIVE DIAGNOSIS: Same as pre-op     DIAGNOSIS: A. RIGHT KNEE FRAGMENT; ARTHROPLASTY: - OSTEOARTHRITIS.  B. RIGHT KNEE SYNOVIUM, POSSIBLE PVNS; EXCISION: - SIDEROTIC SYNOVITIS. - NO EVIDENCE OF A NEOPLASM.   GROSS DESCRIPTION:  A. Labeled: right knee fragments  Size of specimen: aggregate, 14.2 x 9.5 x 2.8 cm Articular surfaces: granular red tan focally chalky white areas Gross evidence of gout: focal chalky white areas Description of cut surfaces: red to tan Other findings: none noted  Block summary: 1 - representative sections, post decalcification  B. Labeled:  right synovium possible PVMS  Tissue fragment(s): 1 Size: 5.5 x 3.6 x 1.5 cm Description: pink-tan fibrofatty with some ar eas possible muscle Representative submitted in one cassette(s).        Final Diagnosis performed by Delorse Lek, MD.  Electronically signed 03/14/2016 10:29:14AM    The electronic signature indicates that the named Attending Pathologist has evaluated the specimen  Technical component performed at [facility], [address], [place], [place] [address] Lab: [phone] Dir: Darrick Penna. Evette Doffing, MD  Professional component performed at [facility], [facility], [place], [place], [place] [address] Lab: [phone] Dir: Dellia Nims. Rubinas, MD    Glucose, capillary     Status: None   Collection Time: 03/11/16 10:11 AM  Result Value Ref Range   Glucose-Capillary 93 65 - 99 mg/dL  Glucose, capillary     Status: None   Collection Time: 03/11/16 11:57 AM  Result Value Ref Range   Glucose-Capillary 94 65 - 99  mg/dL  CBC     Status: Abnormal   Collection Time: 03/11/16 12:13 PM  Result Value Ref Range   WBC 6.9 3.6 - 11.0 K/uL   RBC 3.26 (L) 3.80 - 5.20 MIL/uL   Hemoglobin 10.2 (L) 12.0 - 16.0 g/dL   HCT 29.8 (L) 35.0 - 47.0 %   MCV 91.5 80.0 - 100.0 fL   MCH 31.3 26.0 - 34.0 pg   MCHC 34.3 32.0 - 36.0 g/dL   RDW 17.0 (H) 11.5 - 14.5 %   Platelets 131 (L) 150 - 440 K/uL  Creatinine, serum     Status: Abnormal   Collection Time: 03/11/16 12:13 PM  Result Value Ref Range   Creatinine, Ser 1.15 (H) 0.44 - 1.00 mg/dL   GFR calc non Af Amer 47 (L) >60 mL/min   GFR calc Af Amer 55 (L) >60 mL/min    Comment: (NOTE) The eGFR has been calculated using the CKD EPI equation. This calculation has not been validated in all clinical situations. eGFR's persistently <60 mL/min signify possible Chronic Kidney Disease.   CBC     Status: Abnormal   Collection Time: 03/12/16  3:53 AM  Result Value Ref Range   WBC 9.7 3.6 - 11.0 K/uL   RBC 3.47 (L) 3.80 - 5.20 MIL/uL   Hemoglobin 10.8 (L) 12.0 - 16.0 g/dL   HCT 32.3 (L) 35.0 - 47.0 %   MCV 93.2 80.0 - 100.0 fL   MCH 31.2 26.0 - 34.0 pg   MCHC 33.4 32.0 - 36.0 g/dL   RDW 17.1 (H) 11.5 - 14.5 %   Platelets 140 (L) 150 - 440 K/uL  Basic metabolic panel     Status: Abnormal   Collection Time: 03/12/16  3:53 AM  Result Value Ref Range   Sodium 134 (L) 135 - 145 mmol/L   Potassium 4.4 3.5 - 5.1 mmol/L   Chloride 105 101 - 111 mmol/L   CO2 23 22 - 32 mmol/L   Glucose, Bld 127 (H) 65 - 99 mg/dL   BUN 19 6 - 20 mg/dL   Creatinine, Ser 1.16 (H) 0.44 - 1.00 mg/dL   Calcium 8.1 (L) 8.9 - 10.3 mg/dL   GFR calc non Af Amer 47 (L) >60 mL/min   GFR calc Af Amer 54 (L) >60 mL/min    Comment: (NOTE) The eGFR has been calculated using the CKD EPI equation. This calculation has not been validated in all clinical situations. eGFR's persistently <60 mL/min signify possible Chronic Kidney Disease.  Anion gap 6 5 - 15  POCT HgB A1C     Status: Normal    Collection Time: 04/23/16  9:36 AM  Result Value Ref Range   Hemoglobin A1C 5.4   POCT UA - Microalbumin     Status: None   Collection Time: 04/23/16  9:36 AM  Result Value Ref Range   Microalbumin Ur, POC 20 mg/L   Creatinine, POC  mg/dL   Albumin/Creatinine Ratio, Urine, POC       PHQ2/9: Depression screen [facility] 2/9 04/23/2016 11/26/2015 08/27/2015 04/26/2015  Decreased Interest 0 0 0 0  Down, Depressed, Hopeless 0 0 0 0  PHQ - 2 Score 0 0 0 0     Fall Risk: Fall Risk  04/23/2016 11/26/2015 08/27/2015 04/26/2015  Falls in the past year? No No No Yes  Number falls in past yr: - - - 1  Injury with Fall? - - - No     Functional Status Survey: Is the patient deaf or have difficulty hearing?: No Does the patient have difficulty seeing, even when wearing glasses/contacts?: No Does the patient have difficulty concentrating, remembering, or making decisions?: No Does the patient have difficulty walking or climbing stairs?: No Does the patient have difficulty dressing or bathing?: No Does the patient have difficulty doing errands alone such as visiting a doctor's office or shopping?: No    Assessment & Plan  1. Type 2 diabetes mellitus with stage 3 chronic kidney disease, without long-term current use of insulin (HCC)  - POCT HgB A1C - POCT UA - Microalbumin  2. Benign essential HTN  - metoprolol succinate (TOPROL-XL) 100 MG 24 hr tablet; Take 1 tablet (100 mg total) by mouth every evening. Take with or immediately following a meal.  Dispense: 90 tablet; Refill: 1  3. Dyslipidemia  - atorvastatin (LIPITOR) 40 MG tablet; Take 1 tablet (40 mg total) by mouth every evening.  Dispense: 90 tablet; Refill: 1  4. Insomnia  - traZODone (DESYREL) 50 MG tablet; Take 0.5-1 tablets (25-50 mg total) by mouth at bedtime as needed for sleep.  Dispense: 30 tablet; Refill: 0  5. Obstructive apnea  Continue CPAP every night  6. Chronic kidney disease (CKD), stage III (moderate)  Recheck  labs next visit   7. Primary osteoarthritis involving multiple joints  Better since surgeries  8. Morbid obesity, unspecified obesity type [facility])  Discussed with the patient the risk posed by an increased BMI. Discussed importance of portion control, calorie counting and at least 150 minutes of physical activity weekly. Avoid sweet beverages and drink more water. Eat at least 6 servings of fruit and vegetables daily

## 2016-04-25 DIAGNOSIS — Z96651 Presence of right artificial knee joint: Secondary | ICD-10-CM | POA: Diagnosis not present

## 2016-04-29 DIAGNOSIS — G4733 Obstructive sleep apnea (adult) (pediatric): Secondary | ICD-10-CM | POA: Diagnosis not present

## 2016-04-30 DIAGNOSIS — M6281 Muscle weakness (generalized): Secondary | ICD-10-CM | POA: Diagnosis not present

## 2016-04-30 DIAGNOSIS — Z96651 Presence of right artificial knee joint: Secondary | ICD-10-CM | POA: Diagnosis not present

## 2016-04-30 DIAGNOSIS — M25661 Stiffness of right knee, not elsewhere classified: Secondary | ICD-10-CM | POA: Diagnosis not present

## 2016-05-02 DIAGNOSIS — M6281 Muscle weakness (generalized): Secondary | ICD-10-CM | POA: Diagnosis not present

## 2016-05-02 DIAGNOSIS — M25561 Pain in right knee: Secondary | ICD-10-CM | POA: Diagnosis not present

## 2016-05-02 DIAGNOSIS — Z96651 Presence of right artificial knee joint: Secondary | ICD-10-CM | POA: Diagnosis not present

## 2016-05-02 DIAGNOSIS — M25661 Stiffness of right knee, not elsewhere classified: Secondary | ICD-10-CM | POA: Diagnosis not present

## 2016-05-12 DIAGNOSIS — B351 Tinea unguium: Secondary | ICD-10-CM | POA: Diagnosis not present

## 2016-05-12 DIAGNOSIS — L851 Acquired keratosis [keratoderma] palmaris et plantaris: Secondary | ICD-10-CM | POA: Diagnosis not present

## 2016-05-12 DIAGNOSIS — E1142 Type 2 diabetes mellitus with diabetic polyneuropathy: Secondary | ICD-10-CM | POA: Diagnosis not present

## 2016-05-22 ENCOUNTER — Other Ambulatory Visit: Payer: Self-pay | Admitting: Family Medicine

## 2016-05-22 ENCOUNTER — Ambulatory Visit (INDEPENDENT_AMBULATORY_CARE_PROVIDER_SITE_OTHER): Payer: BLUE CROSS/BLUE SHIELD

## 2016-05-22 DIAGNOSIS — G4700 Insomnia, unspecified: Secondary | ICD-10-CM

## 2016-05-22 DIAGNOSIS — Z23 Encounter for immunization: Secondary | ICD-10-CM

## 2016-05-22 NOTE — Telephone Encounter (Signed)
Patient requesting refill of Trazodone be sent to [id number].

## 2016-06-24 DIAGNOSIS — E784 Other hyperlipidemia: Secondary | ICD-10-CM | POA: Diagnosis not present

## 2016-06-24 DIAGNOSIS — I10 Essential (primary) hypertension: Secondary | ICD-10-CM | POA: Diagnosis not present

## 2016-06-24 DIAGNOSIS — R600 Localized edema: Secondary | ICD-10-CM | POA: Diagnosis not present

## 2016-06-24 DIAGNOSIS — R0602 Shortness of breath: Secondary | ICD-10-CM | POA: Diagnosis not present

## 2016-06-24 DIAGNOSIS — M1990 Unspecified osteoarthritis, unspecified site: Secondary | ICD-10-CM | POA: Diagnosis not present

## 2016-07-22 ENCOUNTER — Ambulatory Visit: Payer: BLUE CROSS/BLUE SHIELD

## 2016-07-30 DIAGNOSIS — G4733 Obstructive sleep apnea (adult) (pediatric): Secondary | ICD-10-CM | POA: Diagnosis not present

## 2016-08-21 ENCOUNTER — Other Ambulatory Visit: Payer: Self-pay | Admitting: Family Medicine

## 2016-08-21 ENCOUNTER — Ambulatory Visit
Admission: RE | Admit: 2016-08-21 | Discharge: 2016-08-21 | Disposition: A | Discharge status: Home / Self Care | Payer: BLUE CROSS/BLUE SHIELD | Source: Ambulatory Visit | Attending: Family Medicine | Admitting: Family Medicine

## 2016-08-21 DIAGNOSIS — Z1231 Encounter for screening mammogram for malignant neoplasm of breast: Secondary | ICD-10-CM | POA: Diagnosis present

## 2016-08-21 DIAGNOSIS — Z1239 Encounter for other screening for malignant neoplasm of breast: Secondary | ICD-10-CM

## 2016-08-21 DIAGNOSIS — R928 Other abnormal and inconclusive findings on diagnostic imaging of breast: Secondary | ICD-10-CM

## 2016-08-21 DIAGNOSIS — N6489 Other specified disorders of breast: Secondary | ICD-10-CM

## 2016-08-22 ENCOUNTER — Other Ambulatory Visit: Payer: Self-pay | Admitting: Family Medicine

## 2016-08-22 DIAGNOSIS — R928 Other abnormal and inconclusive findings on diagnostic imaging of breast: Secondary | ICD-10-CM

## 2016-09-05 DIAGNOSIS — M12812 Other specific arthropathies, not elsewhere classified, left shoulder: Secondary | ICD-10-CM | POA: Diagnosis not present

## 2016-09-05 DIAGNOSIS — M12811 Other specific arthropathies, not elsewhere classified, right shoulder: Secondary | ICD-10-CM | POA: Diagnosis not present

## 2016-09-05 DIAGNOSIS — M7989 Other specified soft tissue disorders: Secondary | ICD-10-CM | POA: Diagnosis not present

## 2016-09-05 DIAGNOSIS — Z96651 Presence of right artificial knee joint: Secondary | ICD-10-CM | POA: Diagnosis not present

## 2016-09-16 DIAGNOSIS — B351 Tinea unguium: Secondary | ICD-10-CM | POA: Diagnosis not present

## 2016-09-16 DIAGNOSIS — L851 Acquired keratosis [keratoderma] palmaris et plantaris: Secondary | ICD-10-CM | POA: Diagnosis not present

## 2016-09-16 DIAGNOSIS — E1142 Type 2 diabetes mellitus with diabetic polyneuropathy: Secondary | ICD-10-CM | POA: Diagnosis not present

## 2016-09-18 ENCOUNTER — Ambulatory Visit
Admission: RE | Admit: 2016-09-18 | Discharge: 2016-09-18 | Disposition: A | Discharge status: Home / Self Care | Payer: BLUE CROSS/BLUE SHIELD | Source: Ambulatory Visit | Attending: Family Medicine | Admitting: Family Medicine

## 2016-09-18 DIAGNOSIS — R928 Other abnormal and inconclusive findings on diagnostic imaging of breast: Secondary | ICD-10-CM | POA: Insufficient documentation

## 2016-09-18 DIAGNOSIS — N6489 Other specified disorders of breast: Secondary | ICD-10-CM | POA: Diagnosis not present

## 2016-09-29 ENCOUNTER — Other Ambulatory Visit: Payer: Self-pay | Admitting: Orthopedic Surgery

## 2016-09-29 ENCOUNTER — Ambulatory Visit: Payer: BLUE CROSS/BLUE SHIELD

## 2016-09-29 DIAGNOSIS — M79661 Pain in right lower leg: Secondary | ICD-10-CM

## 2016-09-29 DIAGNOSIS — M7989 Other specified soft tissue disorders: Principal | ICD-10-CM

## 2016-10-02 ENCOUNTER — Ambulatory Visit
Admission: RE | Admit: 2016-10-02 | Discharge: 2016-10-02 | Disposition: A | Discharge status: Home / Self Care | Payer: BLUE CROSS/BLUE SHIELD | Source: Ambulatory Visit | Attending: Orthopedic Surgery | Admitting: Orthopedic Surgery

## 2016-10-06 ENCOUNTER — Ambulatory Visit
Admission: RE | Admit: 2016-10-06 | Discharge: 2016-10-06 | Disposition: A | Discharge status: Home / Self Care | Payer: BLUE CROSS/BLUE SHIELD | Source: Ambulatory Visit | Attending: Orthopedic Surgery | Admitting: Orthopedic Surgery

## 2016-10-06 DIAGNOSIS — M7989 Other specified soft tissue disorders: Secondary | ICD-10-CM | POA: Insufficient documentation

## 2016-10-06 DIAGNOSIS — R2241 Localized swelling, mass and lump, right lower limb: Secondary | ICD-10-CM | POA: Diagnosis not present

## 2016-10-06 DIAGNOSIS — M79661 Pain in right lower leg: Secondary | ICD-10-CM

## 2016-10-13 DIAGNOSIS — H40153 Residual stage of open-angle glaucoma, bilateral: Secondary | ICD-10-CM | POA: Diagnosis not present

## 2016-10-13 LAB — HM DIABETES EYE EXAM

## 2016-10-24 ENCOUNTER — Ambulatory Visit: Payer: BLUE CROSS/BLUE SHIELD | Admitting: Family Medicine

## 2016-11-04 DIAGNOSIS — D229 Melanocytic nevi, unspecified: Secondary | ICD-10-CM | POA: Diagnosis not present

## 2016-11-04 DIAGNOSIS — L811 Chloasma: Secondary | ICD-10-CM | POA: Diagnosis not present

## 2016-11-04 DIAGNOSIS — L821 Other seborrheic keratosis: Secondary | ICD-10-CM | POA: Diagnosis not present

## 2016-11-06 ENCOUNTER — Ambulatory Visit (INDEPENDENT_AMBULATORY_CARE_PROVIDER_SITE_OTHER): Payer: Medicare Other | Admitting: Family Medicine

## 2016-11-06 ENCOUNTER — Encounter: Payer: Self-pay | Admitting: Family Medicine

## 2016-11-06 VITALS — BP 118/74 | HR 78 | Temp 97.6°F | Resp 16 | Ht 71.0 in | Wt 259.9 lb

## 2016-11-06 DIAGNOSIS — G4733 Obstructive sleep apnea (adult) (pediatric): Secondary | ICD-10-CM | POA: Diagnosis not present

## 2016-11-06 DIAGNOSIS — E1140 Type 2 diabetes mellitus with diabetic neuropathy, unspecified: Secondary | ICD-10-CM | POA: Diagnosis not present

## 2016-11-06 DIAGNOSIS — Z23 Encounter for immunization: Secondary | ICD-10-CM | POA: Diagnosis not present

## 2016-11-06 DIAGNOSIS — N183 Chronic kidney disease, stage 3 unspecified: Secondary | ICD-10-CM

## 2016-11-06 DIAGNOSIS — E1122 Type 2 diabetes mellitus with diabetic chronic kidney disease: Secondary | ICD-10-CM | POA: Insufficient documentation

## 2016-11-06 DIAGNOSIS — N1832 Chronic kidney disease, stage 3b: Secondary | ICD-10-CM | POA: Insufficient documentation

## 2016-11-06 DIAGNOSIS — I10 Essential (primary) hypertension: Secondary | ICD-10-CM

## 2016-11-06 DIAGNOSIS — E785 Hyperlipidemia, unspecified: Secondary | ICD-10-CM

## 2016-11-06 DIAGNOSIS — M5030 Other cervical disc degeneration, unspecified cervical region: Secondary | ICD-10-CM

## 2016-11-06 DIAGNOSIS — E1169 Type 2 diabetes mellitus with other specified complication: Secondary | ICD-10-CM | POA: Insufficient documentation

## 2016-11-06 DIAGNOSIS — M150 Primary generalized (osteo)arthritis: Secondary | ICD-10-CM

## 2016-11-06 DIAGNOSIS — M159 Polyosteoarthritis, unspecified: Secondary | ICD-10-CM

## 2016-11-06 LAB — CBC WITH DIFFERENTIAL/PLATELET
Basophils Absolute: 59 cells/uL (ref 0–200)
Basophils Relative: 1 %
Eosinophils Absolute: 295 cells/uL (ref 15–500)
Eosinophils Relative: 5 %
HEMATOCRIT: 39.7 % (ref 35.0–45.0)
HEMOGLOBIN: 12.6 g/dL (ref 11.7–15.5)
Lymphocytes Relative: 26 %
Lymphs Abs: 1534 cells/uL (ref 850–3900)
MCH: 28.8 pg (ref 27.0–33.0)
MCHC: 31.7 g/dL — AB (ref 32.0–36.0)
MCV: 90.6 fL (ref 80.0–100.0)
MONO ABS: 413 {cells}/uL (ref 200–950)
MPV: 9.6 fL (ref 7.5–12.5)
Monocytes Relative: 7 %
NEUTROS PCT: 61 %
Neutro Abs: 3599 cells/uL (ref 1500–7800)
Platelets: 201 10*3/uL (ref 140–400)
RBC: 4.38 MIL/uL (ref 3.80–5.10)
RDW: 17.4 % — ABNORMAL HIGH (ref 11.0–15.0)
WBC: 5.9 10*3/uL (ref 3.8–10.8)

## 2016-11-06 LAB — POCT GLYCOSYLATED HEMOGLOBIN (HGB A1C): Hemoglobin A1C: 6

## 2016-11-06 LAB — COMPLETE METABOLIC PANEL WITH GFR
ALBUMIN: 3.7 g/dL (ref 3.6–5.1)
ALT: 18 U/L (ref 6–29)
AST: 20 U/L (ref 10–35)
Alkaline Phosphatase: 122 U/L (ref 33–130)
BILIRUBIN TOTAL: 0.5 mg/dL (ref 0.2–1.2)
BUN: 18 mg/dL (ref 7–25)
CALCIUM: 9.3 mg/dL (ref 8.6–10.4)
CO2: 28 mmol/L (ref 20–31)
Chloride: 104 mmol/L (ref 98–110)
Creat: 1.1 mg/dL — ABNORMAL HIGH (ref 0.60–0.93)
GFR, Est African American: 58 mL/min — ABNORMAL LOW (ref >=60)
GFR, Est Non African American: 51 mL/min — ABNORMAL LOW (ref >=60)
Glucose, Bld: 87 mg/dL (ref 65–99)
POTASSIUM: 5 mmol/L (ref 3.5–5.3)
Sodium: 141 mmol/L (ref 135–146)
TOTAL PROTEIN: 7.2 g/dL (ref 6.1–8.1)

## 2016-11-06 LAB — LIPID PANEL
CHOLESTEROL: 162 mg/dL (ref <200)
HDL: 91 mg/dL (ref >50)
LDL Cholesterol: 62 mg/dL (ref <100)
Total CHOL/HDL Ratio: 1.8 Ratio (ref <5.0)
Triglycerides: 47 mg/dL (ref <150)
VLDL: 9 mg/dL (ref <30)

## 2016-11-06 MED ORDER — METOPROLOL SUCCINATE ER 100 MG PO TB24: 100.0000 mg | ORAL_TABLET | Freq: Every evening | ORAL | 1 refills | Status: DC

## 2016-11-06 MED ORDER — ATORVASTATIN CALCIUM 40 MG PO TABS: 40.0000 mg | ORAL_TABLET | Freq: Every evening | ORAL | 1 refills | Status: DC

## 2016-11-06 NOTE — Progress Notes (Signed)
Name: Becky Trevino   MRN: [id number]    DOB: 12/26/1945   Date:11/06/2016       Progress Note  Subjective  Chief Complaint  Chief Complaint  Patient presents with  . Medication Refill    6 month F/U  . Hypertension    Denies any symptoms  . Hyperlipidemia    Cramps occasionally  . Diabetes    Checks BS twice monthly, Low-97 Highest-129  . Insomnia    Sleeping 6-7 hours nightly    HPI  DMII with renal manifestation and neuropathy:  fsbs in am's is well controlled, glucose has been well controlled with diet, hbgA1C is still at goal. Urine micro is back to normal, not on ARB because of angiodema. Doing well, no polyphagia, polyuria or polydipsia. She has been off medication for almost two years now She has gained weight recently - it happened after knee surgery in June 2017 , but is still gaining. She will resume walks with her sister soon. She sees Podiatrist she has corns and neuropathy, she is on gabapentin   HTN: she is doing well, on Metoprolol , still has episodes of dizziness, discussed trying taking just half dose and monitor bp at home. No chest pain or palpitation. She is down from 200 mg to 100 mg daily   Hyperlipidemia: taking Atorvastatin daily and denies side effects. No chest pain , no muscles pain.  We will recheck labs  Insomnia: she has been doing well on Trazodone taking it prn.   Cervical DDD: s/p neck fusion in March 2015, she is doing better, still has poor rom of neck, taking gabapentin   OSA: she uses CPAP every night, for most of the night and sometimes all night. Last study was many years ago, husband states that she has pauses during sleep at times.   Patient Active Problem List   Diagnosis Date Noted  . Type 2 diabetes mellitus with diabetic neuropathy, without long-term current use of insulin ([place]) 11/06/2016  . Primary osteoarthritis of knee 03/11/2016  . Primary osteoarthritis of left hip 12/11/2015  . Benign essential HTN 04/21/2015  .  Edema leg 04/21/2015  . Chronic kidney disease (CKD), stage III (moderate) 04/21/2015  . Chronic venous insufficiency 04/21/2015  . DDD (degenerative disc disease), cervical 04/21/2015  . Diabetes mellitus with renal manifestation ([place]) 04/21/2015  . Dyslipidemia 04/21/2015  . Bladder cystocele 04/21/2015  . Urinary incontinence 04/21/2015  . Leg varices 04/21/2015  . Insomnia 04/21/2015  . Eczema intertrigo 04/21/2015  . Obesity, morbid ([place]) 04/21/2015  . Obstructive apnea 04/21/2015  . Menopausal and perimenopausal disorder 04/21/2015  . Supraventricular premature beats 04/21/2015  . Osteoarthritis of multiple joints 04/21/2015  . H/O Spinal surgery 11/14/2013  . Detrusor muscle hypertonia 11/07/2013    Past Surgical History:  Procedure Laterality Date  . ABDOMINAL HYSTERECTOMY  1993   Total  . BUNIONECTOMY Bilateral 1993  . JOINT REPLACEMENT     bilateral knee  . LAMINECTOMY  11/14/2013    Cervical Fusion , [facility], Dr. Delilah Shan  . LASER ABLATION Bilateral 07/29/2012   Dr. Lucky Cowboy  . REPLACEMENT TOTAL KNEE Left 03/2010   Dr. Rudene Christians  . TOTAL HIP ARTHROPLASTY Left 12/11/2015   Procedure: TOTAL HIP ARTHROPLASTY ANTERIOR APPROACH;  Surgeon: Hessie Knows, MD;  Location: [facility] ORS;  Service: Orthopedics;  Laterality: Left;  . TOTAL KNEE ARTHROPLASTY Right 03/11/2016   Procedure: TOTAL KNEE ARTHROPLASTY;  Surgeon: Hessie Knows, MD;  Location: [facility] ORS;  Service: Orthopedics;  Laterality: Right;  Family History  Problem Relation Age of Onset  . Diabetes Mother   . Hyperlipidemia Mother   . Hypertension Mother   . Diabetes Father   . Hyperlipidemia Father   . Hypertension Father   . Obesity Father   . Hypertension Sister   . Hyperlipidemia Sister   . Hyperlipidemia Sister     Social History   Social History  . Marital status: Married    Spouse name: N/A  . Number of children: N/A  . Years of education: N/A   Occupational History  . Not on file.   Social History Main Topics   . Smoking status: Never Smoker  . Smokeless tobacco: Never Used  . Alcohol use No  . Drug use: No  . Sexual activity: Yes    Partners: Male   Other Topics Concern  . Not on file   Social History Narrative  . No narrative on file     Current Outpatient Prescriptions:  .  acetaminophen (TYLENOL) 500 MG tablet, Take 500 mg by mouth every 6 (six) hours as needed. , Disp: , Rfl:  .  aspirin 81 MG chewable tablet, Chew 81 mg by mouth daily. , Disp: , Rfl:  .  atorvastatin (LIPITOR) 40 MG tablet, Take 1 tablet (40 mg total) by mouth every evening., Disp: 90 tablet, Rfl: 1 .  Blood Glucose Monitoring Suppl (FIFTY50 GLUCOSE METER 2.0) w/Device KIT, Use as directed., Disp: , Rfl:  .  Calcium Carbonate-Vitamin D (CALCIUM 600-D) 600-400 MG-UNIT per tablet, Take 1 tablet by mouth 2 (two) times daily., Disp: , Rfl:  .  Elastic Bandages & Supports (MEDICAL COMPRESSION STOCKINGS) MISC, , Disp: , Rfl:  .  furosemide (LASIX) 20 MG tablet, Take 20 mg by mouth daily. , Disp: , Rfl: 6 .  gabapentin (NEURONTIN) 300 MG capsule, TAKE 1 CAPSULE (300 MG TOTAL) BY MOUTH EVERY EVENING., Disp: 90 capsule, Rfl: 5 .  glucose blood (ONE TOUCH ULTRA TEST) test strip, Use as instructed, Disp: 100 each, Rfl: 12 .  imipramine (TOFRANIL) 25 MG tablet, Take 25 mg by mouth daily., Disp: , Rfl:  .  ketoconazole (NIZORAL) 2 % cream, Apply 1 application topically 2 (two) times daily as needed for irritation. , Disp: , Rfl:  .  KLOR-CON 10 10 MEQ tablet, Take 1 tablet by mouth daily., Disp: , Rfl: 3 .  Lancet Devices ([employer] LANCING DEVICE) MISC, 1 each., Disp: , Rfl:  .  latanoprost (XALATAN) 0.005 % ophthalmic solution, Place 1 drop into both eyes at bedtime. , Disp: , Rfl:  .  Magnesium Oxide 500 MG CAPS, Take 1 capsule by mouth daily., Disp: , Rfl:  .  metoprolol succinate (TOPROL-XL) 100 MG 24 hr tablet, Take 1 tablet (100 mg total) by mouth every evening. Take with or immediately following a meal., Disp: 90 tablet, Rfl:  1 .  traZODone (DESYREL) 50 MG tablet, TAKE 1/2 TO 1 TABLET (25-50 MG TOTAL) BY MOUTH AT BEDTIME AS NEEDED FOR SLEEP., Disp: 30 tablet, Rfl: 5  Allergies  Allergen Reactions  . Lisinopril Swelling     ROS  Constitutional: Negative for fever, positive for weight change.  Respiratory: Negative for cough and shortness of breath.   Cardiovascular: Negative for chest pain or palpitations.  Gastrointestinal: Negative for abdominal pain, no bowel changes.  Musculoskeletal: Negative for gait problem or joint swelling.  Skin: Negative for rash.  Neurological: Negative for dizziness or headache.  No other specific complaints in a complete review of systems (except  as listed in HPI above).  Objective  Vitals:   11/06/16 0829  BP: 118/74  Pulse: 78  Resp: 16  Temp: 97.6 F (36.4 C)  TempSrc: Oral  SpO2: 98%  Weight: 259 lb 14.4 oz (117.9 kg)  Height: 5' 11"  (1.803 m)    Body mass index is 36.25 kg/m.  Physical Exam  Constitutional: Patient appears well-developed and well-nourished. Obese  No distress.  HEENT: head atraumatic, normocephalic, pupils equal and reactive to light,  neck supple, throat within normal limits Cardiovascular: Normal rate, regular rhythm and normal heart sounds.  No murmur heard. 2 plus  BLE edema. Pulmonary/Chest: Effort normal and breath sounds normal. No respiratory distress. Abdominal: Soft.  There is no tenderness. Psychiatric: Patient has a normal mood and affect. behavior is normal. Judgment and thought content normal.  Recent Results (from the past 2160 hour(s))  HM DIABETES EYE EXAM     Status: None   Collection Time: 10/13/16 12:00 AM  Result Value Ref Range   HM Diabetic Eye Exam No Retinopathy No Retinopathy    Comment: [place]  POCT HgB A1C     Status: None   Collection Time: 11/06/16  8:44 AM  Result Value Ref Range   Hemoglobin A1C 6.0       PHQ2/9: Depression screen [facility] 2/9 04/23/2016 11/26/2015 08/27/2015 04/26/2015   Decreased Interest 0 0 0 0  Down, Depressed, Hopeless 0 0 0 0  PHQ - 2 Score 0 0 0 0     Fall Risk: Fall Risk  11/06/2016 04/23/2016 11/26/2015 08/27/2015 04/26/2015  Falls in the past year? No No No No Yes  Number falls in past yr: - - - - 1  Injury with Fall? - - - - No     Functional Status Survey: Is the patient deaf or have difficulty hearing?: No Does the patient have difficulty seeing, even when wearing glasses/contacts?: No Does the patient have difficulty concentrating, remembering, or making decisions?: No Does the patient have difficulty walking or climbing stairs?: No Does the patient have difficulty dressing or bathing?: No Does the patient have difficulty doing errands alone such as visiting a doctor's office or shopping?: No    Assessment & Plan  1. Type 2 diabetes mellitus with stage 3 chronic kidney disease, without long-term current use of insulin (HCC)  - POCT HgB A1C  2. Obstructive apnea  - Polysomnography   3. Benign essential HTN  - metoprolol succinate (TOPROL-XL) 100 MG 24 hr tablet; Take 1 tablet (100 mg total) by mouth every evening. Take with or immediately following a meal.  Dispense: 90 tablet; Refill: 1 - CBC with Differential/Platelet - COMPLETE METABOLIC PANEL WITH GFR  4. Dyslipidemia  - atorvastatin (LIPITOR) 40 MG tablet; Take 1 tablet (40 mg total) by mouth every evening.  Dispense: 90 tablet; Refill: 1 - Lipid panel  5. Primary osteoarthritis involving multiple joints  Continue follow up with Dr. Rudene Christians  6. DDD (degenerative disc disease), cervical  Stable   7. Type 2 diabetes mellitus with diabetic neuropathy, without long-term current use of insulin (HCC)  Sees Dr. Elvina Mattes  8. Need for Tdap vaccination  - Tdap vaccine greater than or equal to 7yo IM

## 2016-11-11 DIAGNOSIS — E1142 Type 2 diabetes mellitus with diabetic polyneuropathy: Secondary | ICD-10-CM | POA: Diagnosis not present

## 2016-11-11 DIAGNOSIS — L851 Acquired keratosis [keratoderma] palmaris et plantaris: Secondary | ICD-10-CM | POA: Diagnosis not present

## 2016-11-11 DIAGNOSIS — B351 Tinea unguium: Secondary | ICD-10-CM | POA: Diagnosis not present

## 2016-12-02 DIAGNOSIS — Q6621 Congenital metatarsus primus varus: Secondary | ICD-10-CM | POA: Diagnosis not present

## 2016-12-02 DIAGNOSIS — M79674 Pain in right toe(s): Secondary | ICD-10-CM | POA: Diagnosis not present

## 2017-01-20 DIAGNOSIS — M898X9 Other specified disorders of bone, unspecified site: Secondary | ICD-10-CM | POA: Diagnosis not present

## 2017-01-20 DIAGNOSIS — M79674 Pain in right toe(s): Secondary | ICD-10-CM | POA: Diagnosis not present

## 2017-01-20 DIAGNOSIS — Q6621 Congenital metatarsus primus varus: Secondary | ICD-10-CM | POA: Diagnosis not present

## 2017-01-22 DIAGNOSIS — H40153 Residual stage of open-angle glaucoma, bilateral: Secondary | ICD-10-CM | POA: Diagnosis not present

## 2017-01-26 DIAGNOSIS — M205X1 Other deformities of toe(s) (acquired), right foot: Secondary | ICD-10-CM | POA: Diagnosis not present

## 2017-01-26 DIAGNOSIS — M79674 Pain in right toe(s): Secondary | ICD-10-CM | POA: Diagnosis not present

## 2017-02-10 DIAGNOSIS — G4733 Obstructive sleep apnea (adult) (pediatric): Secondary | ICD-10-CM | POA: Diagnosis not present

## 2017-02-22 ENCOUNTER — Other Ambulatory Visit: Payer: Self-pay | Admitting: Family Medicine

## 2017-03-06 ENCOUNTER — Ambulatory Visit: Payer: BLUE CROSS/BLUE SHIELD | Admitting: Family Medicine

## 2017-03-06 DIAGNOSIS — M25551 Pain in right hip: Secondary | ICD-10-CM | POA: Diagnosis not present

## 2017-03-06 DIAGNOSIS — M25562 Pain in left knee: Secondary | ICD-10-CM | POA: Diagnosis not present

## 2017-03-06 DIAGNOSIS — M25561 Pain in right knee: Secondary | ICD-10-CM | POA: Diagnosis not present

## 2017-03-10 ENCOUNTER — Ambulatory Visit (INDEPENDENT_AMBULATORY_CARE_PROVIDER_SITE_OTHER): Payer: BLUE CROSS/BLUE SHIELD | Admitting: Family Medicine

## 2017-03-10 ENCOUNTER — Encounter: Payer: Self-pay | Admitting: Family Medicine

## 2017-03-10 VITALS — BP 138/74 | HR 72 | Temp 97.5°F | Resp 16 | Ht 71.0 in | Wt 265.2 lb

## 2017-03-10 DIAGNOSIS — G4733 Obstructive sleep apnea (adult) (pediatric): Secondary | ICD-10-CM | POA: Diagnosis not present

## 2017-03-10 DIAGNOSIS — I10 Essential (primary) hypertension: Secondary | ICD-10-CM | POA: Diagnosis not present

## 2017-03-10 DIAGNOSIS — E1140 Type 2 diabetes mellitus with diabetic neuropathy, unspecified: Secondary | ICD-10-CM | POA: Diagnosis not present

## 2017-03-10 DIAGNOSIS — N183 Chronic kidney disease, stage 3 unspecified: Secondary | ICD-10-CM

## 2017-03-10 DIAGNOSIS — M159 Polyosteoarthritis, unspecified: Secondary | ICD-10-CM

## 2017-03-10 DIAGNOSIS — E785 Hyperlipidemia, unspecified: Secondary | ICD-10-CM | POA: Diagnosis not present

## 2017-03-10 DIAGNOSIS — Z9181 History of falling: Secondary | ICD-10-CM

## 2017-03-10 DIAGNOSIS — M5030 Other cervical disc degeneration, unspecified cervical region: Secondary | ICD-10-CM

## 2017-03-10 DIAGNOSIS — M150 Primary generalized (osteo)arthritis: Secondary | ICD-10-CM

## 2017-03-10 LAB — POCT GLYCOSYLATED HEMOGLOBIN (HGB A1C): HEMOGLOBIN A1C: 5.9

## 2017-03-10 MED ORDER — ATORVASTATIN CALCIUM 40 MG PO TABS: 40.0000 mg | ORAL_TABLET | Freq: Every evening | ORAL | 1 refills | Status: DC

## 2017-03-10 MED ORDER — METOPROLOL SUCCINATE ER 100 MG PO TB24: 100.0000 mg | ORAL_TABLET | Freq: Every evening | ORAL | 1 refills | Status: DC

## 2017-03-10 MED ORDER — SEMAGLUTIDE(0.25 OR 0.5MG/DOS) 2 MG/1.5ML ~~LOC~~ SOPN: 0.2500 mg | PEN_INJECTOR | SUBCUTANEOUS | 2 refills | Status: DC

## 2017-03-10 NOTE — Progress Notes (Signed)
Name: Becky Trevino   MRN: [id number]    DOB: 02/12/1946   Date:03/10/2017       Progress Note  Subjective  Chief Complaint  Chief Complaint  Patient presents with  . Medication Refill    3 month F/U  . Diabetes    Does not check BS at home  . Hypertension    Has edema even after wearing compression stockings all day while on her feet  . Hyperlipidemia    Takes Magnesium daily and helps cramps  . Insomnia    Sleeping well with CPAP machine    HPI  DMII with renal manifestation and neuropathy:  fsbs not getting checked at home lately.  HbgA1C is still at goal. Urine micro is back to normal, not on ARB because of angiodema. Doing well, no polyphagia, polyuria or polydipsia. She has been off medication for two years now.  She has gained weight gradually.  She sees Podiatrist she has corns and neuropathy, she is on gabapentin.   Obesity Morbid: she has been gradually gaining weight, she has DM, insulin resistance, needs to lose weight because of co-morbidities.   HTN: she is doing well, on Metoprolol , still has episodes of dizziness, discussed trying taking just half dose and monitor bp at home. No chest pain or palpitation. She is down from 200 mg to 100 mg daily and is doing well.   Hyperlipidemia: taking Atorvastatin daily and denies side effects. No chest pain , no muscles pain.   Insomnia: she has been doing well on Trazodone taking it prn. She is wearing CPAP and is doing well.   Cervical DDD: s/p neck fusion in March 2015, she is doing better, still has poor rom of neck, taking gabapentin   OSA: she uses CPAP every night, for most of the night and sometimes all night. Last study was many years ago, husband states that she has pauses during sleep at times, but she states she is doing better now, feeling more rested in am's.   OA: multi joints, sees Ortho, Dr. Rudene Christians. Both knees are bothering her.   History of recent fall: inside her kitchen about 6 weeks ago, did not  pass out, she thinks she tripped, they have lose rugs in her house.    Patient Active Problem List   Diagnosis Date Noted  . Type 2 diabetes mellitus with diabetic neuropathy, without long-term current use of insulin ([place]) 11/06/2016  . Primary osteoarthritis of knee 03/11/2016  . Primary osteoarthritis of left hip 12/11/2015  . Benign essential HTN 04/21/2015  . Edema leg 04/21/2015  . Chronic kidney disease (CKD), stage III (moderate) 04/21/2015  . Chronic venous insufficiency 04/21/2015  . DDD (degenerative disc disease), cervical 04/21/2015  . Diabetes mellitus with renal manifestation ([place]) 04/21/2015  . Dyslipidemia 04/21/2015  . Bladder cystocele 04/21/2015  . Urinary incontinence 04/21/2015  . Leg varices 04/21/2015  . Insomnia 04/21/2015  . Eczema intertrigo 04/21/2015  . Obesity, morbid ([place]) 04/21/2015  . Obstructive apnea 04/21/2015  . Menopausal and perimenopausal disorder 04/21/2015  . Supraventricular premature beats 04/21/2015  . Osteoarthritis of multiple joints 04/21/2015  . H/O Spinal surgery 11/14/2013  . Detrusor muscle hypertonia 11/07/2013    Past Surgical History:  Procedure Laterality Date  . ABDOMINAL HYSTERECTOMY  1993   Total  . BUNIONECTOMY Bilateral 1993  . JOINT REPLACEMENT     bilateral knee  . LAMINECTOMY  11/14/2013    Cervical Fusion , [facility], Dr. Delilah Shan  . LASER ABLATION Bilateral  07/29/2012   Dr. Lucky Cowboy  . REPLACEMENT TOTAL KNEE Left 03/2010   Dr. Rudene Christians  . TOTAL HIP ARTHROPLASTY Left 12/11/2015   Procedure: TOTAL HIP ARTHROPLASTY ANTERIOR APPROACH;  Surgeon: Hessie Knows, MD;  Location: [facility] ORS;  Service: Orthopedics;  Laterality: Left;  . TOTAL KNEE ARTHROPLASTY Right 03/11/2016   Procedure: TOTAL KNEE ARTHROPLASTY;  Surgeon: Hessie Knows, MD;  Location: [facility] ORS;  Service: Orthopedics;  Laterality: Right;    Family History  Problem Relation Age of Onset  . Diabetes Mother   . Hyperlipidemia Mother   . Hypertension Mother   . Diabetes  Father   . Hyperlipidemia Father   . Hypertension Father   . Obesity Father   . Hypertension Sister   . Hyperlipidemia Sister   . Hyperlipidemia Sister     Social History   Social History  . Marital status: Married    Spouse name: N/A  . Number of children: N/A  . Years of education: N/A   Occupational History  . Not on file.   Social History Main Topics  . Smoking status: Never Smoker  . Smokeless tobacco: Never Used  . Alcohol use No  . Drug use: No  . Sexual activity: Yes    Partners: Male   Other Topics Concern  . Not on file   Social History Narrative  . No narrative on file     Current Outpatient Prescriptions:  .  aspirin 81 MG chewable tablet, Chew 81 mg by mouth daily. , Disp: , Rfl:  .  atorvastatin (LIPITOR) 40 MG tablet, Take 1 tablet (40 mg total) by mouth every evening., Disp: 90 tablet, Rfl: 1 .  Blood Glucose Monitoring Suppl (FIFTY50 GLUCOSE METER 2.0) w/Device KIT, Use as directed., Disp: , Rfl:  .  Calcium Carbonate-Vitamin D (CALCIUM 600-D) 600-400 MG-UNIT per tablet, Take 1 tablet by mouth 2 (two) times daily., Disp: , Rfl:  .  chlorhexidine (PERIDEX) 0.12 % solution, RINSE WITH 1/2 OUNCE FOR 30 SECONDS AFTER THOROUGHLY BRUSHING 3 TIMES A DAY AND SPIT OUT, Disp: , Rfl: 2 .  Elastic Bandages & Supports (MEDICAL COMPRESSION STOCKINGS) MISC, , Disp: , Rfl:  .  furosemide (LASIX) 20 MG tablet, Take 20 mg by mouth daily. , Disp: , Rfl: 6 .  gabapentin (NEURONTIN) 300 MG capsule, TAKE 1 CAPSULE (300 MG TOTAL) BY MOUTH EVERY EVENING., Disp: 90 capsule, Rfl: 2 .  glucose blood (ONE TOUCH ULTRA TEST) test strip, Use as instructed, Disp: 100 each, Rfl: 12 .  imipramine (TOFRANIL) 25 MG tablet, Take 25 mg by mouth daily., Disp: , Rfl:  .  ketoconazole (NIZORAL) 2 % cream, Apply 1 application topically 2 (two) times daily as needed for irritation. , Disp: , Rfl:  .  KLOR-CON 10 10 MEQ tablet, Take 1 tablet by mouth daily., Disp: , Rfl: 3 .  Lancet Devices  ([employer] LANCING DEVICE) MISC, 1 each., Disp: , Rfl:  .  latanoprost (XALATAN) 0.005 % ophthalmic solution, Place 1 drop into both eyes at bedtime. , Disp: , Rfl:  .  Magnesium Oxide 500 MG CAPS, Take 1 capsule by mouth daily., Disp: , Rfl:  .  metoprolol succinate (TOPROL-XL) 100 MG 24 hr tablet, Take 1 tablet (100 mg total) by mouth every evening. Take with or immediately following a meal., Disp: 90 tablet, Rfl: 1 .  traZODone (DESYREL) 50 MG tablet, TAKE 1/2 TO 1 TABLET (25-50 MG TOTAL) BY MOUTH AT BEDTIME AS NEEDED FOR SLEEP., Disp: 30 tablet, Rfl: 5 .  acetaminophen (TYLENOL) 500 MG tablet, Take 500 mg by mouth every 6 (six) hours as needed. , Disp: , Rfl:  .  Semaglutide (OZEMPIC) 0.25 or 0.5 MG/DOSE SOPN, Inject 0.25-0.5 mg into the skin once a week., Disp: 2 pen, Rfl: 2  Allergies  Allergen Reactions  . Lisinopril Swelling     ROS  Constitutional: Negative for fever, positive for  weight change.  Respiratory: Negative for cough and shortness of breath.   Cardiovascular: Negative for chest pain or palpitations.  Gastrointestinal: Negative for abdominal pain, no bowel changes.  Musculoskeletal: Negative for gait problem , positive for  joint swelling - bilateral t knee and pain.  Skin: Negative for rash.  Neurological: Negative for dizziness or headache.  No other specific complaints in a complete review of systems (except as listed in HPI above).  Objective  Vitals:   03/10/17 1518  BP: 138/74  Pulse: 72  Resp: 16  Temp: 97.5 F (36.4 C)  TempSrc: Oral  SpO2: 97%  Weight: 265 lb 3.2 oz (120.3 kg)  Height: 5' 11"  (1.803 m)    Body mass index is 36.99 kg/m.  Physical Exam  Constitutional: Patient appears well-developed and well-nourished. Obese No distress.  HEENT: head atraumatic, normocephalic, pupils equal and reactive to light,  neck supple, throat within normal limits Cardiovascular: Normal rate, regular rhythm and normal heart sounds.  No murmur heard. 1 plus   BLE edema. Pulmonary/Chest: Effort normal and breath sounds normal. No respiratory distress. Abdominal: Soft.  There is no tenderness. Psychiatric: Patient has a normal mood and affect. behavior is normal. Judgment and thought content normal.  Recent Results (from the past 2160 hour(s))  POCT HgB A1C     Status: Normal   Collection Time: 03/10/17  3:23 PM  Result Value Ref Range   Hemoglobin A1C 5.9      PHQ2/9: Depression screen [facility] 2/9 03/10/2017 04/23/2016 11/26/2015 08/27/2015 04/26/2015  Decreased Interest 0 0 0 0 0  Down, Depressed, Hopeless 0 0 0 0 0  PHQ - 2 Score 0 0 0 0 0     Fall Risk: Fall Risk  03/10/2017 11/06/2016 04/23/2016 11/26/2015 08/27/2015  Falls in the past year? Yes No No No No  Number falls in past yr: 1 - - - -  Injury with Fall? No - - - -   Several weeks ago, she turned from the pantry to the kitchen and fell on her stove, she had a bruise on left side, no loss of consciousness, she has rugs in her kitchen and advised to remove it.   Functional Status Survey: Is the patient deaf or have difficulty hearing?: No Does the patient have difficulty seeing, even when wearing glasses/contacts?: No Does the patient have difficulty concentrating, remembering, or making decisions?: No Does the patient have difficulty walking or climbing stairs?: No Does the patient have difficulty dressing or bathing?: No Does the patient have difficulty doing errands alone such as visiting a doctor's office or shopping?: No    Assessment & Plan  1. Type 2 diabetes mellitus with diabetic neuropathy, without long-term current use of insulin (HCC)  hgbA1C is at goal, however she has insulin resistance and obesity and would like to try medication to help curb her appetite and lose weight. There is no family history of thyroid cancer or endocrine cancers or personal history of pancreatitis.  - POCT HgB A1C - Semaglutide (OZEMPIC) 0.25 or 0.5 MG/DOSE SOPN; Inject 0.25-0.5 mg into the  skin once a week.  Dispense: 2 pen; Refill: 2  2. Morbid obesity, unspecified obesity type [facility])  Discussed with the patient the risk posed by an increased BMI. Discussed importance of portion control, calorie counting and at least 150 minutes of physical activity weekly. Avoid sweet beverages and drink more water. Eat at least 6 servings of fruit and vegetables daily   3. Obstructive apnea  Continue CPAP every night   4. Benign essential HTN  - metoprolol succinate (TOPROL-XL) 100 MG 24 hr tablet; Take 1 tablet (100 mg total) by mouth every evening. Take with or immediately following a meal.  Dispense: 90 tablet; Refill: 1  5. Dyslipidemia  - atorvastatin (LIPITOR) 40 MG tablet; Take 1 tablet (40 mg total) by mouth every evening.  Dispense: 90 tablet; Refill: 1  6. Primary osteoarthritis involving multiple joints  Seeing Dr. Rudene Christians  7. DDD (degenerative disc disease), cervical  stable  8. Chronic kidney disease (CKD), stage III (moderate)  Reviewed labs with patient   9. History of recent fall  Advised PT, however she states she will resume exercising advised by PT in the past, she will call back if any other falls.

## 2017-03-11 ENCOUNTER — Ambulatory Visit: Payer: BLUE CROSS/BLUE SHIELD | Admitting: Family Medicine

## 2017-03-30 DIAGNOSIS — M1990 Unspecified osteoarthritis, unspecified site: Secondary | ICD-10-CM | POA: Diagnosis not present

## 2017-03-30 DIAGNOSIS — E784 Other hyperlipidemia: Secondary | ICD-10-CM | POA: Diagnosis not present

## 2017-03-30 DIAGNOSIS — R0602 Shortness of breath: Secondary | ICD-10-CM | POA: Diagnosis not present

## 2017-03-30 DIAGNOSIS — I10 Essential (primary) hypertension: Secondary | ICD-10-CM | POA: Diagnosis not present

## 2017-03-30 DIAGNOSIS — R600 Localized edema: Secondary | ICD-10-CM | POA: Diagnosis not present

## 2017-04-06 DIAGNOSIS — B351 Tinea unguium: Secondary | ICD-10-CM | POA: Diagnosis not present

## 2017-04-06 DIAGNOSIS — L851 Acquired keratosis [keratoderma] palmaris et plantaris: Secondary | ICD-10-CM | POA: Diagnosis not present

## 2017-04-06 DIAGNOSIS — E1142 Type 2 diabetes mellitus with diabetic polyneuropathy: Secondary | ICD-10-CM | POA: Diagnosis not present

## 2017-05-21 ENCOUNTER — Ambulatory Visit (INDEPENDENT_AMBULATORY_CARE_PROVIDER_SITE_OTHER): Payer: BLUE CROSS/BLUE SHIELD | Admitting: Family Medicine

## 2017-05-21 ENCOUNTER — Encounter: Payer: Self-pay | Admitting: Family Medicine

## 2017-05-21 VITALS — BP 118/78 | HR 95 | Temp 98.4°F | Resp 16 | Wt 245.5 lb

## 2017-05-21 DIAGNOSIS — J069 Acute upper respiratory infection, unspecified: Secondary | ICD-10-CM

## 2017-05-21 MED ORDER — FLUTICASONE PROPIONATE 50 MCG/ACT NA SUSP: 2.0000 | Freq: Every day | NASAL | 2 refills | Status: DC

## 2017-05-21 MED ORDER — GUAIFENESIN ER 600 MG PO TB12: 600.0000 mg | ORAL_TABLET | Freq: Two times a day (BID) | ORAL | 0 refills | Status: DC

## 2017-05-21 NOTE — Patient Instructions (Signed)
Upper Respiratory Infection, Adult Most upper respiratory infections (URIs) are a viral infection of the air passages leading to the lungs. A URI affects the nose, throat, and upper air passages. The most common type of URI is nasopharyngitis and is typically referred to as "the common cold." URIs run their course and usually go away on their own. Most of the time, a URI does not require medical attention, but sometimes a bacterial infection in the upper airways can follow a viral infection. This is called a secondary infection. Sinus and middle ear infections are common types of secondary upper respiratory infections. Bacterial pneumonia can also complicate a URI. A URI can worsen asthma and chronic obstructive pulmonary disease (COPD). Sometimes, these complications can require emergency medical care and may be life threatening. What are the causes? Almost all URIs are caused by viruses. A virus is a type of germ and can spread from one person to another. What increases the risk? You may be at risk for a URI if:  You smoke.  You have chronic heart or lung disease.  You have a weakened defense (immune) system.  You are very young or very old.  You have nasal allergies or asthma.  You work in crowded or poorly ventilated areas.  You work in health care facilities or schools.  What are the signs or symptoms? Symptoms typically develop 2-3 days after you come in contact with a cold virus. Most viral URIs last 7-10 days. However, viral URIs from the influenza virus (flu virus) can last 14-18 days and are typically more severe. Symptoms may include:  Runny or stuffy (congested) nose.  Sneezing.  Cough.  Sore throat.  Headache.  Fatigue.  Fever.  Loss of appetite.  Pain in your forehead, behind your eyes, and over your cheekbones (sinus pain).  Muscle aches.  How is this diagnosed? Your health care provider may diagnose a URI by:  Physical exam.  Tests to check that your  symptoms are not due to another condition such as: ? Strep throat. ? Sinusitis. ? Pneumonia. ? Asthma.  How is this treated? A URI goes away on its own with time. It cannot be cured with medicines, but medicines may be prescribed or recommended to relieve symptoms. Medicines may help:  Reduce your fever.  Reduce your cough.  Relieve nasal congestion.  Follow these instructions at home:  Take medicines only as directed by your health care provider.  Gargle warm saltwater or take cough drops to comfort your throat as directed by your health care provider.  Use a warm mist humidifier or inhale steam from a shower to increase air moisture. This may make it easier to breathe.  Drink enough fluid to keep your urine clear or pale yellow.  Eat soups and other clear broths and maintain good nutrition.  Rest as needed.  Return to work when your temperature has returned to normal or as your health care provider advises. You may need to stay home longer to avoid infecting others. You can also use a face mask and careful hand washing to prevent spread of the virus.  Increase the usage of your inhaler if you have asthma.  Do not use any tobacco products, including cigarettes, chewing tobacco, or electronic cigarettes. If you need help quitting, ask your health care provider. How is this prevented? The best way to protect yourself from getting a cold is to practice good hygiene.  Avoid oral or hand contact with people with cold symptoms.  Wash your   hands often if contact occurs.  There is no clear evidence that vitamin C, vitamin E, echinacea, or exercise reduces the chance of developing a cold. However, it is always recommended to get plenty of rest, exercise, and practice good nutrition. Contact a health care provider if:  You are getting worse rather than better.  Your symptoms are not controlled by medicine.  You have chills.  You have worsening shortness of breath.  You have  brown or red mucus.  You have yellow or brown nasal discharge.  You have pain in your face, especially when you bend forward.  You have a fever.  You have swollen neck glands.  You have pain while swallowing.  You have white areas in the back of your throat. Get help right away if:  You have severe or persistent: ? Headache. ? Ear pain. ? Sinus pain. ? Chest pain.  You have chronic lung disease and any of the following: ? Wheezing. ? Prolonged cough. ? Coughing up blood. ? A change in your usual mucus.  You have a stiff neck.  You have changes in your: ? Vision. ? Hearing. ? Thinking. ? Mood. This information is not intended to replace advice given to you by your health care provider. Make sure you discuss any questions you have with your health care provider. Document Released: 02/25/2001 Document Revised: 05/04/2016 Document Reviewed: 12/07/2013 Elsevier Interactive Patient Education  2017 Elsevier Inc.  

## 2017-05-21 NOTE — Progress Notes (Signed)
Name: Becky Trevino   MRN: [id number]    DOB: Feb 08, 1946   Date:05/21/2017       Progress Note  Subjective  Chief Complaint  Chief Complaint  Patient presents with  . Cough    non productive  . Sore Throat  . Nasal Congestion    HPI  URI: symptoms started suddenly yesterday, with rhinorrhea, mild dry cough ( triggered by an itchy in her throat), mild sore throat with cough, also some frontal headache and severe nasal congestion. Unable to use CPAP last night. Has been afebrile, normal appetite, no rashes, no nausea or vomiting. She has not tried any otc medication   Patient Active Problem List   Diagnosis Date Noted  . Type 2 diabetes mellitus with diabetic neuropathy, without long-term current use of insulin ([place]) 11/06/2016  . Primary osteoarthritis of knee 03/11/2016  . Primary osteoarthritis of left hip 12/11/2015  . Benign essential HTN 04/21/2015  . Edema leg 04/21/2015  . Chronic kidney disease (CKD), stage III (moderate) 04/21/2015  . Chronic venous insufficiency 04/21/2015  . DDD (degenerative disc disease), cervical 04/21/2015  . Diabetes mellitus with renal manifestation ([place]) 04/21/2015  . Dyslipidemia 04/21/2015  . Bladder cystocele 04/21/2015  . Urinary incontinence 04/21/2015  . Leg varices 04/21/2015  . Insomnia 04/21/2015  . Eczema intertrigo 04/21/2015  . Obesity, morbid ([place]) 04/21/2015  . Obstructive apnea 04/21/2015  . Menopausal and perimenopausal disorder 04/21/2015  . Supraventricular premature beats 04/21/2015  . Osteoarthritis of multiple joints 04/21/2015  . H/O Spinal surgery 11/14/2013  . Detrusor muscle hypertonia 11/07/2013    Past Surgical History:  Procedure Laterality Date  . ABDOMINAL HYSTERECTOMY  1993   Total  . BUNIONECTOMY Bilateral 1993  . JOINT REPLACEMENT     bilateral knee  . LAMINECTOMY  11/14/2013    Cervical Fusion , [facility], Dr. Delilah Shan  . LASER ABLATION Bilateral 07/29/2012   Dr. Lucky Cowboy  . REPLACEMENT TOTAL KNEE Left  03/2010   Dr. Rudene Christians  . TOTAL HIP ARTHROPLASTY Left 12/11/2015   Procedure: TOTAL HIP ARTHROPLASTY ANTERIOR APPROACH;  Surgeon: Hessie Knows, MD;  Location: [facility] ORS;  Service: Orthopedics;  Laterality: Left;  . TOTAL KNEE ARTHROPLASTY Right 03/11/2016   Procedure: TOTAL KNEE ARTHROPLASTY;  Surgeon: Hessie Knows, MD;  Location: [facility] ORS;  Service: Orthopedics;  Laterality: Right;    Family History  Problem Relation Age of Onset  . Diabetes Mother   . Hyperlipidemia Mother   . Hypertension Mother   . Diabetes Father   . Hyperlipidemia Father   . Hypertension Father   . Obesity Father   . Hypertension Sister   . Hyperlipidemia Sister   . Hyperlipidemia Sister     Social History   Social History  . Marital status: Married    Spouse name: N/A  . Number of children: N/A  . Years of education: N/A   Occupational History  . Not on file.   Social History Main Topics  . Smoking status: Never Smoker  . Smokeless tobacco: Never Used  . Alcohol use No  . Drug use: No  . Sexual activity: Yes    Partners: Male   Other Topics Concern  . Not on file   Social History Narrative  . No narrative on file     Current Outpatient Prescriptions:  .  acetaminophen (TYLENOL) 500 MG tablet, Take 500 mg by mouth every 6 (six) hours as needed. , Disp: , Rfl:  .  aspirin 81 MG chewable tablet, Chew 81 mg  by mouth daily. , Disp: , Rfl:  .  atorvastatin (LIPITOR) 40 MG tablet, Take 1 tablet (40 mg total) by mouth every evening., Disp: 90 tablet, Rfl: 1 .  Blood Glucose Monitoring Suppl (FIFTY50 GLUCOSE METER 2.0) w/Device KIT, Use as directed., Disp: , Rfl:  .  Calcium Carbonate-Vitamin D (CALCIUM 600-D) 600-400 MG-UNIT per tablet, Take 1 tablet by mouth 2 (two) times daily., Disp: , Rfl:  .  chlorhexidine (PERIDEX) 0.12 % solution, RINSE WITH 1/2 OUNCE FOR 30 SECONDS AFTER THOROUGHLY BRUSHING 3 TIMES A DAY AND SPIT OUT, Disp: , Rfl: 2 .  Elastic Bandages & Supports (MEDICAL COMPRESSION STOCKINGS)  MISC, , Disp: , Rfl:  .  furosemide (LASIX) 20 MG tablet, Take 20 mg by mouth daily. , Disp: , Rfl: 6 .  gabapentin (NEURONTIN) 300 MG capsule, TAKE 1 CAPSULE (300 MG TOTAL) BY MOUTH EVERY EVENING., Disp: 90 capsule, Rfl: 2 .  glucose blood (ONE TOUCH ULTRA TEST) test strip, Use as instructed, Disp: 100 each, Rfl: 12 .  imipramine (TOFRANIL) 25 MG tablet, Take 25 mg by mouth daily., Disp: , Rfl:  .  ketoconazole (NIZORAL) 2 % cream, Apply 1 application topically 2 (two) times daily as needed for irritation. , Disp: , Rfl:  .  KLOR-CON 10 10 MEQ tablet, Take 1 tablet by mouth daily., Disp: , Rfl: 3 .  Lancet Devices ([employer] LANCING DEVICE) MISC, 1 each., Disp: , Rfl:  .  latanoprost (XALATAN) 0.005 % ophthalmic solution, Place 1 drop into both eyes at bedtime. , Disp: , Rfl:  .  Magnesium Oxide 500 MG CAPS, Take 1 capsule by mouth daily., Disp: , Rfl:  .  metoprolol succinate (TOPROL-XL) 100 MG 24 hr tablet, Take 1 tablet (100 mg total) by mouth every evening. Take with or immediately following a meal., Disp: 90 tablet, Rfl: 1 .  Semaglutide (OZEMPIC) 0.25 or 0.5 MG/DOSE SOPN, Inject 0.25-0.5 mg into the skin once a week., Disp: 2 pen, Rfl: 2 .  traZODone (DESYREL) 50 MG tablet, TAKE 1/2 TO 1 TABLET (25-50 MG TOTAL) BY MOUTH AT BEDTIME AS NEEDED FOR SLEEP., Disp: 30 tablet, Rfl: 5 .  fluticasone (FLONASE) 50 MCG/ACT nasal spray, Place 2 sprays into both nostrils daily., Disp: 16 g, Rfl: 2 .  guaiFENesin (MUCINEX) 600 MG 12 hr tablet, Take 1 tablet (600 mg total) by mouth 2 (two) times daily., Disp: 20 tablet, Rfl: 0  Allergies  Allergen Reactions  . Lisinopril Swelling     ROS  Ten systems reviewed and is negative except as mentioned in HPI  Weight loss, since started on Ozempic  Objective  Vitals:   05/21/17 0755  BP: 118/78  Pulse: 95  Resp: 16  Temp: 98.4 F (36.9 C)  SpO2: 96%  Weight: 245 lb 8 oz (111.4 kg)    Body mass index is 34.24 kg/m.  Physical  Exam  Constitutional: Patient appears well-developed and well-nourished. Obese  No distress.  HEENT: head atraumatic, normocephalic, pupils equal and reactive to light, ears normal TM bilaterally,  neck supple, throat within normal limits, mild swelling of turbinates, mild tenderness during percussion of frontal sinus Cardiovascular: Normal rate, regular rhythm and normal heart sounds.  No murmur heard. Trace  BLE edema. Pulmonary/Chest: Effort normal and breath sounds normal. No respiratory distress. Abdominal: Soft.  There is no tenderness. Psychiatric: Patient has a normal mood and affect. behavior is normal. Judgment and thought content normal.  Recent Results (from the past 2160 hour(s))  POCT HgB A1C  Status: Normal   Collection Time: 03/10/17  3:23 PM  Result Value Ref Range   Hemoglobin A1C 5.9      PHQ2/9: Depression screen [facility] 2/9 03/10/2017 04/23/2016 11/26/2015 08/27/2015 04/26/2015  Decreased Interest 0 0 0 0 0  Down, Depressed, Hopeless 0 0 0 0 0  PHQ - 2 Score 0 0 0 0 0     Fall Risk: Fall Risk  03/10/2017 11/06/2016 04/23/2016 11/26/2015 08/27/2015  Falls in the past year? Yes No No No No  Number falls in past yr: 1 - - - -  Comment - - - - -  Injury with Fall? No - - - -     Assessment & Plan  1. Acute URI  Advised fluids, rest and also nasal saline for nasal congestion - fluticasone (FLONASE) 50 MCG/ACT nasal spray; Place 2 sprays into both nostrils daily.  Dispense: 16 g; Refill: 2 - guaiFENesin (MUCINEX) 600 MG 12 hr tablet; Take 1 tablet (600 mg total) by mouth 2 (two) times daily.  Dispense: 20 tablet; Refill: 0  -Red flags and when to present for emergency care or RTC including fever >101.35F, chest pain, shortness of breath, new/worsening/un-resolving symptoms,  reviewed with patient at time of visit. Follow up and care instructions discussed and provided in AVS.

## 2017-05-25 ENCOUNTER — Ambulatory Visit: Payer: Medicare Other | Admitting: Family Medicine

## 2017-05-26 DIAGNOSIS — J208 Acute bronchitis due to other specified organisms: Secondary | ICD-10-CM | POA: Diagnosis not present

## 2017-05-26 DIAGNOSIS — R0602 Shortness of breath: Secondary | ICD-10-CM | POA: Diagnosis not present

## 2017-05-26 DIAGNOSIS — J9801 Acute bronchospasm: Secondary | ICD-10-CM | POA: Diagnosis not present

## 2017-06-09 ENCOUNTER — Encounter: Payer: Self-pay | Admitting: Family Medicine

## 2017-06-09 ENCOUNTER — Ambulatory Visit (INDEPENDENT_AMBULATORY_CARE_PROVIDER_SITE_OTHER): Payer: BLUE CROSS/BLUE SHIELD | Admitting: Family Medicine

## 2017-06-09 VITALS — BP 112/70 | HR 68 | Temp 97.5°F | Resp 16 | Ht 71.0 in | Wt 259.2 lb

## 2017-06-09 DIAGNOSIS — G4733 Obstructive sleep apnea (adult) (pediatric): Secondary | ICD-10-CM

## 2017-06-09 DIAGNOSIS — N183 Chronic kidney disease, stage 3 unspecified: Secondary | ICD-10-CM

## 2017-06-09 DIAGNOSIS — E1140 Type 2 diabetes mellitus with diabetic neuropathy, unspecified: Secondary | ICD-10-CM

## 2017-06-09 DIAGNOSIS — Z23 Encounter for immunization: Secondary | ICD-10-CM

## 2017-06-09 DIAGNOSIS — E785 Hyperlipidemia, unspecified: Secondary | ICD-10-CM | POA: Diagnosis not present

## 2017-06-09 DIAGNOSIS — M159 Polyosteoarthritis, unspecified: Secondary | ICD-10-CM

## 2017-06-09 DIAGNOSIS — M150 Primary generalized (osteo)arthritis: Secondary | ICD-10-CM

## 2017-06-09 DIAGNOSIS — I10 Essential (primary) hypertension: Secondary | ICD-10-CM | POA: Diagnosis not present

## 2017-06-09 DIAGNOSIS — E1122 Type 2 diabetes mellitus with diabetic chronic kidney disease: Secondary | ICD-10-CM | POA: Diagnosis not present

## 2017-06-09 LAB — POCT UA - MICROALBUMIN: Microalbumin Ur, POC: 20 mg/L

## 2017-06-09 LAB — POCT GLYCOSYLATED HEMOGLOBIN (HGB A1C): Hemoglobin A1C: 6

## 2017-06-09 MED ORDER — SEMAGLUTIDE(0.25 OR 0.5MG/DOS) 2 MG/1.5ML ~~LOC~~ SOPN: 0.5000 mg | PEN_INJECTOR | SUBCUTANEOUS | 2 refills | Status: DC

## 2017-06-09 MED ORDER — METOPROLOL SUCCINATE ER 100 MG PO TB24: 100.0000 mg | ORAL_TABLET | Freq: Every evening | ORAL | 1 refills | Status: DC

## 2017-06-09 MED ORDER — ATORVASTATIN CALCIUM 40 MG PO TABS: 40.0000 mg | ORAL_TABLET | Freq: Every evening | ORAL | 1 refills | Status: DC

## 2017-06-09 NOTE — Progress Notes (Signed)
Name: Becky Trevino   MRN: [id number]    DOB: Dec 31, 1945   Date:06/09/2017       Progress Note  Subjective  Chief Complaint  Chief Complaint  Patient presents with  . Medication Refill    3 month F/U  . Diabetes    Checks sugar monthly, Average-90's  . Hypertension    Denies any symptoms  . Hyperlipidemia  . Insomnia    Doing well, sleeping on average 5 to 8 hours nightly    HPI  DMII with renal manifestation and neuropathy: fsbs not getting checked at home lately.  HbgA1C is still at goal. Urine micro is back to normal, not on ARB because of angiodema. Doing well, no polyphagia, polyuria or polydipsia. She is on Ozempic now, but did not titrate dose up to 0.55m.  She sees Podiatrist she has corns and neuropathy, she is on gabapentin.   Obesity Morbid: she has been gradually gaining weight, she has DM, insulin resistance, she is on Ozempic and denies side effects, she was losing weight, but gained some again since last visit  HTN: she is doing well, on Metoprolol , down to 100 mg of medication, no longer has dizziness, no chest pain or palpitation  Hyperlipidemia: taking Atorvastatin daily and denies side effects. No chest pain , no muscles pain.   Insomnia: she has been doing well on Trazodone taking it prn. She is wearing CPAP and is doing well. No headaches or fatigue when wakes up.  Cervical DDD: s/p neck fusion in March 2015, she is doing better, still has poor rom of neck, taking gabapentin   OSA: she uses CPAP every night, for most of the night and sometimes all night. Last study was many years ago, husband states that she has pauses during sleep only if she does not wear CPAP, but she states she is doing better now, feeling more rested in am's.   OA: multi joints, sees Ortho, Dr. MRudene Christians Both knees taking Tylenol and stable at this time  Patient Active Problem List   Diagnosis Date Noted  . Type 2 diabetes mellitus with diabetic neuropathy, without long-term  current use of insulin ([place] 11/06/2016  . Primary osteoarthritis of knee 03/11/2016  . Primary osteoarthritis of left hip 12/11/2015  . Benign essential HTN 04/21/2015  . Edema leg 04/21/2015  . Chronic kidney disease (CKD), stage III (moderate) 04/21/2015  . Chronic venous insufficiency 04/21/2015  . DDD (degenerative disc disease), cervical 04/21/2015  . Diabetes mellitus with renal manifestation ([place] 04/21/2015  . Dyslipidemia 04/21/2015  . Bladder cystocele 04/21/2015  . Urinary incontinence 04/21/2015  . Leg varices 04/21/2015  . Insomnia 04/21/2015  . Eczema intertrigo 04/21/2015  . Obesity, morbid ([place] 04/21/2015  . Obstructive apnea 04/21/2015  . Menopausal and perimenopausal disorder 04/21/2015  . Supraventricular premature beats 04/21/2015  . Osteoarthritis of multiple joints 04/21/2015  . H/O Spinal surgery 11/14/2013  . Detrusor muscle hypertonia 11/07/2013    Past Surgical History:  Procedure Laterality Date  . ABDOMINAL HYSTERECTOMY  1993   Total  . BUNIONECTOMY Bilateral 1993  . JOINT REPLACEMENT     bilateral knee  . LAMINECTOMY  11/14/2013    Cervical Fusion , [facility], Dr. BDelilah Shan . LASER ABLATION Bilateral 07/29/2012   Dr. DLucky Cowboy . REPLACEMENT TOTAL KNEE Left 03/2010   Dr. MRudene Christians . TOTAL HIP ARTHROPLASTY Left 12/11/2015   Procedure: TOTAL HIP ARTHROPLASTY ANTERIOR APPROACH;  Surgeon: MHessie Knows MD;  Location: [facility] ORS;  Service: Orthopedics;  Laterality: Left;  . TOTAL KNEE ARTHROPLASTY Right 03/11/2016   Procedure: TOTAL KNEE ARTHROPLASTY;  Surgeon: Hessie Knows, MD;  Location: [facility] ORS;  Service: Orthopedics;  Laterality: Right;    Family History  Problem Relation Age of Onset  . Diabetes Mother   . Hyperlipidemia Mother   . Hypertension Mother   . Diabetes Father   . Hyperlipidemia Father   . Hypertension Father   . Obesity Father   . Hypertension Sister   . Hyperlipidemia Sister   . Hyperlipidemia Sister     Social History   Social History   . Marital status: Married    Spouse name: N/A  . Number of children: N/A  . Years of education: N/A   Occupational History  . Not on file.   Social History Main Topics  . Smoking status: Never Smoker  . Smokeless tobacco: Never Used  . Alcohol use No  . Drug use: No  . Sexual activity: Yes    Partners: Male   Other Topics Concern  . Not on file   Social History Narrative  . No narrative on file     Current Outpatient Prescriptions:  .  acetaminophen (TYLENOL) 500 MG tablet, Take 500 mg by mouth every 6 (six) hours as needed. , Disp: , Rfl:  .  aspirin 81 MG chewable tablet, Chew 81 mg by mouth daily. , Disp: , Rfl:  .  atorvastatin (LIPITOR) 40 MG tablet, Take 1 tablet (40 mg total) by mouth every evening., Disp: 90 tablet, Rfl: 1 .  Blood Glucose Monitoring Suppl (FIFTY50 GLUCOSE METER 2.0) w/Device KIT, Use as directed., Disp: , Rfl:  .  Calcium Carbonate-Vitamin D (CALCIUM 600-D) 600-400 MG-UNIT per tablet, Take 1 tablet by mouth 2 (two) times daily., Disp: , Rfl:  .  Elastic Bandages & Supports (MEDICAL COMPRESSION STOCKINGS) MISC, , Disp: , Rfl:  .  fluticasone (FLONASE) 50 MCG/ACT nasal spray, Place 2 sprays into both nostrils daily., Disp: 16 g, Rfl: 2 .  furosemide (LASIX) 20 MG tablet, Take 20 mg by mouth daily. , Disp: , Rfl: 6 .  gabapentin (NEURONTIN) 300 MG capsule, TAKE 1 CAPSULE (300 MG TOTAL) BY MOUTH EVERY EVENING., Disp: 90 capsule, Rfl: 2 .  glucose blood (ONE TOUCH ULTRA TEST) test strip, Use as instructed, Disp: 100 each, Rfl: 12 .  imipramine (TOFRANIL) 25 MG tablet, Take 25 mg by mouth daily., Disp: , Rfl:  .  ketoconazole (NIZORAL) 2 % cream, Apply 1 application topically 2 (two) times daily as needed for irritation. , Disp: , Rfl:  .  KLOR-CON 10 10 MEQ tablet, Take 1 tablet by mouth daily., Disp: , Rfl: 3 .  Lancet Devices ([employer] LANCING DEVICE) MISC, 1 each., Disp: , Rfl:  .  latanoprost (XALATAN) 0.005 % ophthalmic solution, Place 1 drop into both  eyes at bedtime. , Disp: , Rfl:  .  Magnesium Oxide 500 MG CAPS, Take 1 capsule by mouth daily., Disp: , Rfl:  .  metoprolol succinate (TOPROL-XL) 100 MG 24 hr tablet, Take 1 tablet (100 mg total) by mouth every evening. Take with or immediately following a meal., Disp: 90 tablet, Rfl: 1 .  Semaglutide (OZEMPIC) 0.25 or 0.5 MG/DOSE SOPN, Inject 0.5 mg into the skin once a week., Disp: 2 pen, Rfl: 2 .  traZODone (DESYREL) 50 MG tablet, TAKE 1/2 TO 1 TABLET (25-50 MG TOTAL) BY MOUTH AT BEDTIME AS NEEDED FOR SLEEP., Disp: 30 tablet, Rfl: 5  Allergies  Allergen Reactions  .  Lisinopril Swelling     ROS  Constitutional: Negative for fever , positive weight change.  Respiratory: Negative for cough and shortness of breath.   Cardiovascular: Negative for chest pain or palpitations.  Gastrointestinal: Negative for abdominal pain, no bowel changes.  Musculoskeletal: positive for intermittent  joint swelling and gait problem  Skin: Negative for rash.  Neurological: Negative for dizziness or headache.  No other specific complaints in a complete review of systems (except as listed in HPI above).  Objective  Vitals:   06/09/17 0944  BP: 112/70  Pulse: 68  Resp: 16  Temp: (!) 97.5 F (36.4 C)  TempSrc: Oral  SpO2: 99%  Weight: 259 lb 3.2 oz (117.6 kg)  Height: 5' 11"  (1.803 m)    Body mass index is 36.15 kg/m.  Physical Exam  Constitutional: Patient appears well-developed and well-nourished. Obese  No distress.  HEENT: head atraumatic, normocephalic, pupils equal and reactive to light,  neck with decrease rom ( history of fusion) , throat within normal limits Cardiovascular: Normal rate, regular rhythm and normal heart sounds.  No murmur heard. 1 plus BLE edema. Pulmonary/Chest: Effort normal and breath sounds normal. No respiratory distress. Abdominal: Soft.  There is no tenderness. Psychiatric: Patient has a normal mood and affect. behavior is normal. Judgment and thought content  normal. Muscular Skeletal: crepitus with extension of both knees.   Recent Results (from the past 2160 hour(s))  POCT HgB A1C     Status: None   Collection Time: 06/09/17  9:47 AM  Result Value Ref Range   Hemoglobin A1C 6.0   POCT UA - Microalbumin     Status: None   Collection Time: 06/09/17  9:47 AM  Result Value Ref Range   Microalbumin Ur, POC 20 mg/L   Creatinine, POC  mg/dL   Albumin/Creatinine Ratio, Urine, POC       PHQ2/9: Depression screen [facility] 2/9 06/09/2017 03/10/2017 04/23/2016 11/26/2015 08/27/2015  Decreased Interest 0 0 0 0 0  Down, Depressed, Hopeless 0 0 0 0 0  PHQ - 2 Score 0 0 0 0 0     Fall Risk: Fall Risk  06/09/2017 03/10/2017 11/06/2016 04/23/2016 11/26/2015  Falls in the past year? No Yes No No No  Number falls in past yr: - 1 - - -  Comment - - - - -  Injury with Fall? - No - - -     Functional Status Survey: Is the patient deaf or have difficulty hearing?: No Does the patient have difficulty seeing, even when wearing glasses/contacts?: No Does the patient have difficulty concentrating, remembering, or making decisions?: No Does the patient have difficulty walking or climbing stairs?: No Does the patient have difficulty dressing or bathing?: No Does the patient have difficulty doing errands alone such as visiting a doctor's office or shopping?: No    Assessment & Plan  1. Type 2 diabetes mellitus with diabetic neuropathy, without long-term current use of insulin (HCC)  - POCT HgB A1C - POCT UA - Microalbumin - Semaglutide (OZEMPIC) 0.25 or 0.5 MG/DOSE SOPN; Inject 0.5 mg into the skin once a week.  Dispense: 2 pen; Refill: 2  2. Needs flu shot  - Flu vaccine HIGH DOSE PF  3. Morbid obesity, unspecified obesity type ([place])  She gained 14 lbs in the past 3 weeks, but likely not accurate , because previous weight was significant, we will adjust dose of Ozempic to 0.5 mg . No symptoms of CHF  4. Obstructive apnea  Wearing machine  every  night  5. Benign essential HTN  - metoprolol succinate (TOPROL-XL) 100 MG 24 hr tablet; Take 1 tablet (100 mg total) by mouth every evening. Take with or immediately following a meal.  Dispense: 90 tablet; Refill: 1  6. Dyslipidemia  - atorvastatin (LIPITOR) 40 MG tablet; Take 1 tablet (40 mg total) by mouth every evening.  Dispense: 90 tablet; Refill: 1  7. Chronic kidney disease (CKD), stage III (moderate)  Stable, we will recheck next visit  8. Type 2 diabetes mellitus with stage 3 chronic kidney disease, without long-term current use of insulin (HCC)  - Semaglutide (OZEMPIC) 0.25 or 0.5 MG/DOSE SOPN; Inject 0.5 mg into the skin once a week.  Dispense: 2 pen; Refill: 2  9. Primary osteoarthritis involving multiple joints  Taking Tylenol and is stable

## 2017-07-01 DIAGNOSIS — M25551 Pain in right hip: Secondary | ICD-10-CM | POA: Diagnosis not present

## 2017-07-01 DIAGNOSIS — M1611 Unilateral primary osteoarthritis, right hip: Secondary | ICD-10-CM | POA: Diagnosis not present

## 2017-07-31 ENCOUNTER — Other Ambulatory Visit: Payer: Self-pay | Admitting: Family Medicine

## 2017-07-31 DIAGNOSIS — E1140 Type 2 diabetes mellitus with diabetic neuropathy, unspecified: Secondary | ICD-10-CM

## 2017-07-31 NOTE — Telephone Encounter (Signed)
Refill request for general medication. [facility].  Last office visit: 06/09/2017  Next visit: 09/03/2017.

## 2017-08-10 ENCOUNTER — Telehealth: Payer: Self-pay | Admitting: Family Medicine

## 2017-08-10 NOTE — Telephone Encounter (Signed)
Copied from [place] [phone]. Topic: Quick Communication - See Telephone Encounter >> Aug 10, 2017  2:56 PM Hewitt Shorts wrote: CRM for notification. See Telephone encounter for: 08/10/17. Pt states that her insurance is no longer covering ozempic and she is needing to find something else to be called in   Best number [phone]

## 2017-08-10 NOTE — Telephone Encounter (Signed)
Please see documentation below.  Patient's insurance no longer covering [place].  Is there an alternative to this for the patient.  / Please contact patient to discuss @ [phone] /

## 2017-08-10 NOTE — Telephone Encounter (Signed)
Patient Insurance gave alternatives: Bydureon, Trulicity, Victoza or Byetta. Dr. Ancil Boozer asked I called the patient and ask if she prefer one daily with more weight loss effects or a weekly injection with not as much weight loss. Patient states she would rather try the Victoza daily for the weight loss help.

## 2017-08-11 ENCOUNTER — Other Ambulatory Visit: Payer: Self-pay | Admitting: Family Medicine

## 2017-08-11 MED ORDER — INSULIN PEN NEEDLE 30G X 8 MM MISC: 1.0000 | 1 refills | Status: DC | PRN

## 2017-08-11 MED ORDER — LIRAGLUTIDE 18 MG/3ML ~~LOC~~ SOPN: 0.6000 mg | PEN_INJECTOR | Freq: Every day | SUBCUTANEOUS | 0 refills | Status: DC

## 2017-08-13 ENCOUNTER — Other Ambulatory Visit: Payer: Self-pay | Admitting: Family Medicine

## 2017-08-13 DIAGNOSIS — E1140 Type 2 diabetes mellitus with diabetic neuropathy, unspecified: Secondary | ICD-10-CM

## 2017-08-13 MED ORDER — INSULIN PEN NEEDLE 30G X 8 MM MISC: 1 refills | Status: DC

## 2017-08-13 NOTE — Progress Notes (Signed)
Received clarification request for Novofine needles; new Rx provided per orders.

## 2017-08-17 ENCOUNTER — Other Ambulatory Visit: Payer: Self-pay

## 2017-08-17 DIAGNOSIS — E1140 Type 2 diabetes mellitus with diabetic neuropathy, unspecified: Secondary | ICD-10-CM

## 2017-08-17 MED ORDER — INSULIN PEN NEEDLE 30G X 8 MM MISC: 1.0000 | 1 refills | Status: DC | PRN

## 2017-08-17 NOTE — Progress Notes (Signed)
Received fax from [place] stating Novofine 30 gauge is discontinued and gave alternative for Noveoine autocover 30 G. Spoke with Raquel Sarna while in office she gave verbal ok to send in alternative. New rx was sent to pharmacy.

## 2017-08-18 ENCOUNTER — Other Ambulatory Visit: Payer: Self-pay | Admitting: Family Medicine

## 2017-08-18 ENCOUNTER — Other Ambulatory Visit: Payer: Self-pay

## 2017-08-18 DIAGNOSIS — E1122 Type 2 diabetes mellitus with diabetic chronic kidney disease: Secondary | ICD-10-CM

## 2017-08-18 DIAGNOSIS — N183 Chronic kidney disease, stage 3 (moderate): Principal | ICD-10-CM

## 2017-08-18 NOTE — Telephone Encounter (Signed)
Pt calling to get clarification of mls and mg written on the prescription. Explained the difference to the pt. Pt verbalized understanding.   Pt also stating that there was an issue with having her prescription for needles filled at [place] [facility] and states that the prescription needs to say NOVOFINE AUTOCOVER 30g with specific instructions with how many times a day the pt is to use, to ensure pt is supplied with a sufficient amount of needles that would be covered by insurance. Prescription was sent in on 12/3 but does not have the specific instructions listed.

## 2017-08-18 NOTE — Telephone Encounter (Signed)
The one that they are requesting is the [employer]

## 2017-08-18 NOTE — Telephone Encounter (Signed)
Directions states one daily with victoza

## 2017-08-18 NOTE — Telephone Encounter (Signed)
Fax from [facility] requesting an alternative for the Novofine Auto cover 30G since the plain novofine 30 has been discontinued.  Refill request was sent to Raelyn Ensign, FNP for approval and submission.

## 2017-08-18 NOTE — Telephone Encounter (Signed)
Please advise may use product selection - if they have a specific alternative that works best, let me know and I will place order. Thank you!

## 2017-08-19 MED ORDER — INSULIN PEN NEEDLE 30G X 8 MM MISC: 1 refills | Status: DC

## 2017-08-19 NOTE — Addendum Note (Signed)
Addended by: Inda Coke on: 08/19/2017 04:12 PM   Modules accepted: Orders

## 2017-09-03 ENCOUNTER — Encounter: Payer: Self-pay | Admitting: Family Medicine

## 2017-09-03 ENCOUNTER — Ambulatory Visit (INDEPENDENT_AMBULATORY_CARE_PROVIDER_SITE_OTHER): Payer: BLUE CROSS/BLUE SHIELD | Admitting: Family Medicine

## 2017-09-03 VITALS — BP 120/90 | HR 78 | Resp 14 | Ht 71.0 in | Wt 261.6 lb

## 2017-09-03 DIAGNOSIS — I10 Essential (primary) hypertension: Secondary | ICD-10-CM

## 2017-09-03 DIAGNOSIS — G4700 Insomnia, unspecified: Secondary | ICD-10-CM

## 2017-09-03 DIAGNOSIS — M5030 Other cervical disc degeneration, unspecified cervical region: Secondary | ICD-10-CM

## 2017-09-03 DIAGNOSIS — N183 Chronic kidney disease, stage 3 unspecified: Secondary | ICD-10-CM

## 2017-09-03 DIAGNOSIS — G4733 Obstructive sleep apnea (adult) (pediatric): Secondary | ICD-10-CM

## 2017-09-03 DIAGNOSIS — M150 Primary generalized (osteo)arthritis: Secondary | ICD-10-CM

## 2017-09-03 DIAGNOSIS — E1140 Type 2 diabetes mellitus with diabetic neuropathy, unspecified: Secondary | ICD-10-CM | POA: Diagnosis not present

## 2017-09-03 DIAGNOSIS — E785 Hyperlipidemia, unspecified: Secondary | ICD-10-CM | POA: Diagnosis not present

## 2017-09-03 DIAGNOSIS — M159 Polyosteoarthritis, unspecified: Secondary | ICD-10-CM

## 2017-09-03 LAB — POCT GLYCOSYLATED HEMOGLOBIN (HGB A1C): HEMOGLOBIN A1C: 5.9

## 2017-09-03 NOTE — Progress Notes (Signed)
Name: Becky Trevino   MRN: [id number]    DOB: Nov 05, 1945   Date:09/03/2017       Progress Note  Subjective  Chief Complaint  Chief Complaint  Patient presents with  . Diabetes  . Obesity    HPI  DMII with renal manifestation and neuropathy: fsbs not getting checked at home lately. HbgA1C is still at goal.- today 5.9%  Urine micro is back to normal, not on ARB because of angiodema. Doing well, no polyphagia or polydipsia, she has urinary frequency but may be from bladder problems.   She sees Podiatrist she has corns and neuropathy, she is on gabapentin. She was on Ozempic but had to be switched to [employer] - 1.2 mg daily , she denies side effects, she thinks it curbs her appetite. She has been walking again - 3 times weekly and avoiding sweets.   Obesity Morbid: she has been gradually gaining weight, she has DM, insulin resistance, she is on Victoza and is trying to lose weight, avoiding sweets and exercising again.   HTN: bp DBP today slightly up, usually at goal, continue  Metoprolol , no longer has dizziness, no chest pain or palpitation  Hyperlipidemia: taking Atorvastatin daily and denies side effects. No chest pain , no muscles pain. She is fating today and we will check labs  Insomnia:  She is wearing CPAP and is doing well, no longer has to take Trazodone for sleep, just occasionally now No headaches or fatigue when wakes up.  Cervical DDD: s/p neck fusion in March 2015, she is doing better, still has poor rom of neck, taking gabapentin every night.   OSA: she uses CPAP every night, for most of the night and sometimes all night. She has new equipment.  OA: multi joints, sees Ortho, Dr. Rudene Christians. Both knees taking Tylenol and stable at this time   Patient Active Problem List   Diagnosis Date Noted  . Type 2 diabetes mellitus with diabetic neuropathy, without long-term current use of insulin ([place]) 11/06/2016  . Primary osteoarthritis of knee 03/11/2016  .  Primary osteoarthritis of left hip 12/11/2015  . Benign essential HTN 04/21/2015  . Edema leg 04/21/2015  . Chronic kidney disease (CKD), stage III (moderate) ([place]) 04/21/2015  . Chronic venous insufficiency 04/21/2015  . DDD (degenerative disc disease), cervical 04/21/2015  . Diabetes mellitus with renal manifestation ([place]) 04/21/2015  . Dyslipidemia 04/21/2015  . Bladder cystocele 04/21/2015  . Urinary incontinence 04/21/2015  . Leg varices 04/21/2015  . Insomnia 04/21/2015  . Eczema intertrigo 04/21/2015  . Obesity, morbid ([place]) 04/21/2015  . Obstructive apnea 04/21/2015  . Menopausal and perimenopausal disorder 04/21/2015  . Supraventricular premature beats 04/21/2015  . Osteoarthritis of multiple joints 04/21/2015  . H/O Spinal surgery 11/14/2013  . Detrusor muscle hypertonia 11/07/2013    Past Surgical History:  Procedure Laterality Date  . ABDOMINAL HYSTERECTOMY  1993   Total  . BUNIONECTOMY Bilateral 1993  . JOINT REPLACEMENT     bilateral knee  . LAMINECTOMY  11/14/2013    Cervical Fusion , [facility], Dr. Delilah Shan  . LASER ABLATION Bilateral 07/29/2012   Dr. Lucky Cowboy  . REPLACEMENT TOTAL KNEE Left 03/2010   Dr. Rudene Christians  . TOTAL HIP ARTHROPLASTY Left 12/11/2015   Procedure: TOTAL HIP ARTHROPLASTY ANTERIOR APPROACH;  Surgeon: Hessie Knows, MD;  Location: [facility] ORS;  Service: Orthopedics;  Laterality: Left;  . TOTAL KNEE ARTHROPLASTY Right 03/11/2016   Procedure: TOTAL KNEE ARTHROPLASTY;  Surgeon: Hessie Knows, MD;  Location: [facility] ORS;  Service: Orthopedics;  Laterality: Right;    Family History  Problem Relation Age of Onset  . Diabetes Mother   . Hyperlipidemia Mother   . Hypertension Mother   . Diabetes Father   . Hyperlipidemia Father   . Hypertension Father   . Obesity Father   . Hypertension Sister   . Hyperlipidemia Sister   . Hyperlipidemia Sister     Social History   Socioeconomic History  . Marital status: Married    Spouse name: Not on file  . Number of  children: Not on file  . Years of education: Not on file  . Highest education level: Not on file  Social Needs  . Financial resource strain: Not on file  . Food insecurity - worry: Not on file  . Food insecurity - inability: Not on file  . Transportation needs - medical: Not on file  . Transportation needs - non-medical: Not on file  Occupational History  . Not on file  Tobacco Use  . Smoking status: Never Smoker  . Smokeless tobacco: Never Used  Substance and Sexual Activity  . Alcohol use: No    Alcohol/week: 0.0 oz  . Drug use: No  . Sexual activity: Yes    Partners: Male  Other Topics Concern  . Not on file  Social History Narrative  . Not on file     Current Outpatient Medications:  .  acetaminophen (TYLENOL) 500 MG tablet, Take 500 mg by mouth every 6 (six) hours as needed. , Disp: , Rfl:  .  aspirin 81 MG chewable tablet, Chew 81 mg by mouth daily. , Disp: , Rfl:  .  atorvastatin (LIPITOR) 40 MG tablet, Take 1 tablet (40 mg total) by mouth every evening., Disp: 90 tablet, Rfl: 1 .  Blood Glucose Monitoring Suppl (FIFTY50 GLUCOSE METER 2.0) w/Device KIT, Use as directed., Disp: , Rfl:  .  Calcium Carbonate-Vitamin D (CALCIUM 600-D) 600-400 MG-UNIT per tablet, Take 1 tablet by mouth 2 (two) times daily., Disp: , Rfl:  .  Elastic Bandages & Supports (MEDICAL COMPRESSION STOCKINGS) MISC, , Disp: , Rfl:  .  fluticasone (FLONASE) 50 MCG/ACT nasal spray, Place 2 sprays into both nostrils daily., Disp: 16 g, Rfl: 2 .  furosemide (LASIX) 20 MG tablet, Take 20 mg by mouth daily. , Disp: , Rfl: 6 .  gabapentin (NEURONTIN) 300 MG capsule, TAKE 1 CAPSULE (300 MG TOTAL) BY MOUTH EVERY EVENING., Disp: 90 capsule, Rfl: 2 .  glucose blood (ONE TOUCH ULTRA TEST) test strip, Use as instructed, Disp: 100 each, Rfl: 12 .  imipramine (TOFRANIL) 25 MG tablet, Take 25 mg by mouth daily., Disp: , Rfl:  .  Insulin Pen Needle (NOVOFINE AUTOCOVER) 30G X 8 MM MISC, Inject one needle daily with  Victoza injection.  Dx: E11.40  DM with diabetic neuropathy, Disp: 100 each, Rfl: 1 .  ketoconazole (NIZORAL) 2 % cream, Apply 1 application topically 2 (two) times daily as needed for irritation. , Disp: , Rfl:  .  KLOR-CON 10 10 MEQ tablet, Take 1 tablet by mouth daily., Disp: , Rfl: 3 .  Lancet Devices ([facility] LANCING DEVICE) MISC, 1 each., Disp: , Rfl:  .  latanoprost (XALATAN) 0.005 % ophthalmic solution, Place 1 drop into both eyes at bedtime. , Disp: , Rfl:  .  liraglutide (VICTOZA) 18 MG/3ML SOPN, Inject 0.1-0.3 mLs (0.6-1.8 mg total) into the skin daily., Disp: 27 mL, Rfl: 0 .  Magnesium Oxide 500 MG CAPS, Take 1 capsule by mouth daily.,  Disp: , Rfl:  .  metoprolol succinate (TOPROL-XL) 100 MG 24 hr tablet, Take 1 tablet (100 mg total) by mouth every evening. Take with or immediately following a meal., Disp: 90 tablet, Rfl: 1 .  traZODone (DESYREL) 50 MG tablet, TAKE 1/2 TO 1 TABLET (25-50 MG TOTAL) BY MOUTH AT BEDTIME AS NEEDED FOR SLEEP., Disp: 30 tablet, Rfl: 5 .  celecoxib (CELEBREX) 200 MG capsule, Take 1 capsule by mouth daily., Disp: , Rfl: 11  Allergies  Allergen Reactions  . Lisinopril Swelling     ROS  Constitutional: Negative for fever or weight change.  Respiratory: Negative for cough and shortness of breath.   Cardiovascular: Negative for chest pain or palpitations.  Gastrointestinal: Negative for abdominal pain, no bowel changes.  Musculoskeletal: Positive  for gait problem and  joint swelling.  Skin: Negative for rash.  Neurological: Negative for dizziness or headache.  No other specific complaints in a complete review of systems (except as listed in HPI above).  Objective  Vitals:   09/03/17 0745  BP: 120/90  Pulse: 78  Resp: 14  SpO2: 97%  Weight: 261 lb 9.6 oz (118.7 kg)  Height: _0  (1.803 m)    Body mass index is 36.49 kg/m.  Physical Exam  Constitutional: Patient appears well-developed and well-nourished. Obese No distress.  HEENT: head  atraumatic, normocephalic, pupils equal and reactive to light,  neck supple, throat within normal limits Cardiovascular: Normal rate, regular rhythm and normal heart sounds.  No murmur heard. Trace BLE edema. Pulmonary/Chest: Effort normal and breath sounds normal. No respiratory distress. Abdominal: Soft.  There is no tenderness. Psychiatric: Patient has a normal mood and affect. behavior is normal. Judgment and thought content normal.  Recent Results (from the past 2160 hour(s))  POCT HgB A1C     Status: None   Collection Time: 06/09/17  9:47 AM  Result Value Ref Range   Hemoglobin A1C 6.0   POCT UA - Microalbumin     Status: None   Collection Time: 06/09/17  9:47 AM  Result Value Ref Range   Microalbumin Ur, POC 20 mg/L   Creatinine, POC  mg/dL   Albumin/Creatinine Ratio, Urine, POC    POCT HgB A1C     Status: Abnormal   Collection Time: 09/03/17  7:36 AM  Result Value Ref Range   Hemoglobin A1C 5.9      PHQ2/9: Depression screen [facility] 2/9 06/09/2017 03/10/2017 04/23/2016 11/26/2015 08/27/2015  Decreased Interest 0 0 0 0 0  Down, Depressed, Hopeless 0 0 0 0 0  PHQ - 2 Score 0 0 0 0 0    Fall Risk: Fall Risk  09/03/2017 06/09/2017 03/10/2017 11/06/2016 04/23/2016  Falls in the past year? No No Yes No No  Number falls in past yr: - - 1 - -  Comment - - - - -  Injury with Fall? - - No - -    Functional Status Survey: Is the patient deaf or have difficulty hearing?: No Does the patient have difficulty seeing, even when wearing glasses/contacts?: No Does the patient have difficulty concentrating, remembering, or making decisions?: No Does the patient have difficulty walking or climbing stairs?: No Does the patient have difficulty dressing or bathing?: No Does the patient have difficulty doing errands alone such as visiting a doctor's office or shopping?: No    Assessment & Plan  1. Type 2 diabetes mellitus with diabetic neuropathy, without long-term current use of insulin  (HCC)  - POCT HgB A1C  2.  Morbid obesity, unspecified obesity type [facility])  Discussed with the patient the risk posed by an increased BMI. Discussed importance of portion control, calorie counting and at least 150 minutes of physical activity weekly. Avoid sweet beverages and drink more water. Eat at least 6 servings of fruit and vegetables daily  Continue Victoza  3. Dyslipidemia  - Lipid panel  4. Chronic kidney disease (CKD), stage III (moderate) (HCC)  - COMPLETE METABOLIC PANEL WITH GFR - CBC with Differential/Platelet - VITAMIN D 25 Hydroxy (Vit-D Deficiency, Fractures) - Parathyroid hormone, intact (no Ca)  5. Benign essential HTN  Monitor bp at home  6. Obstructive apnea  Continue CPAP machine  7. Primary osteoarthritis involving multiple joints  Stable on Tylenol   8. DDD (degenerative disc disease), cervical   9. Insomnia, unspecified type  Doing better with CPAP use

## 2017-09-04 LAB — COMPLETE METABOLIC PANEL WITH GFR
AG RATIO: 1.1 (calc) (ref 1.0–2.5)
ALKALINE PHOSPHATASE (APISO): 112 U/L (ref 33–130)
ALT: 21 U/L (ref 6–29)
AST: 25 U/L (ref 10–35)
Albumin: 3.8 g/dL (ref 3.6–5.1)
BUN/Creatinine Ratio: 13 (calc) (ref 6–22)
BUN: 15 mg/dL (ref 7–25)
CO2: 29 mmol/L (ref 20–32)
Calcium: 9.2 mg/dL (ref 8.6–10.4)
Chloride: 103 mmol/L (ref 98–110)
Creat: 1.15 mg/dL — ABNORMAL HIGH (ref 0.60–0.93)
GFR, Est African American: 55 mL/min/{1.73_m2} — ABNORMAL LOW (ref >=60)
GFR, Est Non African American: 48 mL/min/{1.73_m2} — ABNORMAL LOW (ref >=60)
GLOBULIN: 3.5 g/dL (ref 1.9–3.7)
Glucose, Bld: 77 mg/dL (ref 65–99)
POTASSIUM: 4.6 mmol/L (ref 3.5–5.3)
SODIUM: 140 mmol/L (ref 135–146)
Total Bilirubin: 0.6 mg/dL (ref 0.2–1.2)
Total Protein: 7.3 g/dL (ref 6.1–8.1)

## 2017-09-04 LAB — CBC WITH DIFFERENTIAL/PLATELET
BASOS PCT: 0.4 %
Basophils Absolute: 19 cells/uL (ref 0–200)
EOS ABS: 240 {cells}/uL (ref 15–500)
Eosinophils Relative: 5.1 %
HCT: 39.2 % (ref 35.0–45.0)
Hemoglobin: 13.2 g/dL (ref 11.7–15.5)
Lymphs Abs: 1217 cells/uL (ref 850–3900)
MCH: 32 pg (ref 27.0–33.0)
MCHC: 33.7 g/dL (ref 32.0–36.0)
MCV: 94.9 fL (ref 80.0–100.0)
MONOS PCT: 9.4 %
MPV: 10.6 fL (ref 7.5–12.5)
Neutro Abs: 2782 cells/uL (ref 1500–7800)
Neutrophils Relative %: 59.2 %
PLATELETS: 184 10*3/uL (ref 140–400)
RBC: 4.13 10*6/uL (ref 3.80–5.10)
RDW: 13.7 % (ref 11.0–15.0)
TOTAL LYMPHOCYTE: 25.9 %
WBC mixed population: 442 cells/uL (ref 200–950)
WBC: 4.7 10*3/uL (ref 3.8–10.8)

## 2017-09-04 LAB — LIPID PANEL
CHOLESTEROL: 154 mg/dL (ref <200)
HDL: 83 mg/dL (ref >50)
LDL CHOLESTEROL (CALC): 58 mg/dL
Non-HDL Cholesterol (Calc): 71 mg/dL (calc) (ref <130)
Total CHOL/HDL Ratio: 1.9 (calc) (ref <5.0)
Triglycerides: 47 mg/dL (ref <150)

## 2017-09-04 LAB — VITAMIN D 25 HYDROXY (VIT D DEFICIENCY, FRACTURES): Vit D, 25-Hydroxy: 43 ng/mL (ref 30–100)

## 2017-09-04 LAB — PARATHYROID HORMONE, INTACT (NO CA): PTH: 52 pg/mL (ref 14–64)

## 2017-09-14 ENCOUNTER — Other Ambulatory Visit: Payer: Self-pay | Admitting: Family Medicine

## 2017-09-14 DIAGNOSIS — Z1231 Encounter for screening mammogram for malignant neoplasm of breast: Secondary | ICD-10-CM

## 2017-09-21 DIAGNOSIS — M79674 Pain in right toe(s): Secondary | ICD-10-CM | POA: Diagnosis not present

## 2017-09-21 DIAGNOSIS — L600 Ingrowing nail: Secondary | ICD-10-CM | POA: Diagnosis not present

## 2017-09-21 LAB — HM DIABETES FOOT EXAM

## 2017-09-28 DIAGNOSIS — H40153 Residual stage of open-angle glaucoma, bilateral: Secondary | ICD-10-CM | POA: Diagnosis not present

## 2017-10-08 DIAGNOSIS — I10 Essential (primary) hypertension: Secondary | ICD-10-CM | POA: Diagnosis not present

## 2017-10-08 DIAGNOSIS — R0602 Shortness of breath: Secondary | ICD-10-CM | POA: Diagnosis not present

## 2017-10-08 DIAGNOSIS — R600 Localized edema: Secondary | ICD-10-CM | POA: Diagnosis not present

## 2017-10-08 DIAGNOSIS — E7849 Other hyperlipidemia: Secondary | ICD-10-CM | POA: Diagnosis not present

## 2017-10-09 ENCOUNTER — Ambulatory Visit
Admission: RE | Admit: 2017-10-09 | Discharge: 2017-10-09 | Disposition: A | Discharge status: Home / Self Care | Payer: BLUE CROSS/BLUE SHIELD | Source: Ambulatory Visit | Attending: Family Medicine | Admitting: Family Medicine

## 2017-10-09 DIAGNOSIS — Z1231 Encounter for screening mammogram for malignant neoplasm of breast: Secondary | ICD-10-CM | POA: Insufficient documentation

## 2017-10-26 DIAGNOSIS — B351 Tinea unguium: Secondary | ICD-10-CM | POA: Diagnosis not present

## 2017-10-26 DIAGNOSIS — L600 Ingrowing nail: Secondary | ICD-10-CM | POA: Diagnosis not present

## 2017-10-26 DIAGNOSIS — L851 Acquired keratosis [keratoderma] palmaris et plantaris: Secondary | ICD-10-CM | POA: Diagnosis not present

## 2017-10-26 DIAGNOSIS — M79674 Pain in right toe(s): Secondary | ICD-10-CM | POA: Diagnosis not present

## 2017-10-26 DIAGNOSIS — E1142 Type 2 diabetes mellitus with diabetic polyneuropathy: Secondary | ICD-10-CM | POA: Diagnosis not present

## 2017-11-05 ENCOUNTER — Ambulatory Visit
Admission: RE | Admit: 2017-11-05 | Discharge: 2017-11-05 | Disposition: A | Discharge status: Home / Self Care | Payer: BLUE CROSS/BLUE SHIELD | Source: Ambulatory Visit | Attending: Student | Admitting: Student

## 2017-11-05 ENCOUNTER — Other Ambulatory Visit: Payer: Self-pay | Admitting: Student

## 2017-11-05 DIAGNOSIS — Z96651 Presence of right artificial knee joint: Secondary | ICD-10-CM | POA: Insufficient documentation

## 2017-11-05 DIAGNOSIS — M7121 Synovial cyst of popliteal space [Baker], right knee: Secondary | ICD-10-CM | POA: Insufficient documentation

## 2017-11-05 DIAGNOSIS — M7989 Other specified soft tissue disorders: Secondary | ICD-10-CM

## 2017-11-05 DIAGNOSIS — R600 Localized edema: Secondary | ICD-10-CM | POA: Diagnosis not present

## 2017-11-06 ENCOUNTER — Other Ambulatory Visit
Admission: RE | Admit: 2017-11-06 | Discharge: 2017-11-06 | Disposition: A | Discharge status: Home / Self Care | Payer: BLUE CROSS/BLUE SHIELD | Source: Ambulatory Visit | Attending: Student | Admitting: Student

## 2017-11-06 DIAGNOSIS — M7989 Other specified soft tissue disorders: Secondary | ICD-10-CM | POA: Diagnosis not present

## 2017-11-06 DIAGNOSIS — Z96651 Presence of right artificial knee joint: Secondary | ICD-10-CM | POA: Insufficient documentation

## 2017-11-06 LAB — SYNOVIAL FLUID, CRYSTAL: Crystals, Fluid: NONE SEEN

## 2017-11-10 ENCOUNTER — Other Ambulatory Visit: Payer: Self-pay | Admitting: Family Medicine

## 2017-11-10 LAB — BODY FLUID CULTURE: Culture: NO GROWTH

## 2017-11-10 NOTE — Telephone Encounter (Signed)
Refill request for general medication: Gabapentin 300 mg  Last office visit: 09/03/2018  Last physical exam: None indicated  Follow-up on file. 12/17/2017

## 2017-11-11 ENCOUNTER — Other Ambulatory Visit: Payer: Self-pay | Admitting: Family Medicine

## 2017-11-12 ENCOUNTER — Other Ambulatory Visit
Admission: RE | Admit: 2017-11-12 | Discharge: 2017-11-12 | Disposition: A | Discharge status: Home / Self Care | Payer: BLUE CROSS/BLUE SHIELD | Source: Ambulatory Visit | Attending: Sports Medicine | Admitting: Sports Medicine

## 2017-11-12 DIAGNOSIS — M7121 Synovial cyst of popliteal space [Baker], right knee: Secondary | ICD-10-CM | POA: Diagnosis not present

## 2017-11-12 LAB — SYNOVIAL CELL COUNT + DIFF, W/ CRYSTALS
Crystals, Fluid: NONE SEEN
Eosinophils-Synovial: 0 %
Lymphocytes-Synovial Fld: 0 %
MONOCYTE-MACROPHAGE-SYNOVIAL FLUID: 2 %
Neutrophil, Synovial: 98 %
Other Cells-SYN: 0
WBC, SYNOVIAL: 59839 /mm3 — AB (ref 0–200)

## 2017-11-16 LAB — BODY FLUID CULTURE: Culture: NO GROWTH

## 2017-11-17 ENCOUNTER — Encounter
Admission: RE | Admit: 2017-11-17 | Discharge: 2017-11-17 | Disposition: A | Discharge status: Home / Self Care | Payer: BLUE CROSS/BLUE SHIELD | Source: Ambulatory Visit | Attending: Orthopedic Surgery | Admitting: Orthopedic Surgery

## 2017-11-17 ENCOUNTER — Other Ambulatory Visit: Payer: Self-pay

## 2017-11-17 DIAGNOSIS — Z01810 Encounter for preprocedural cardiovascular examination: Secondary | ICD-10-CM | POA: Insufficient documentation

## 2017-11-17 DIAGNOSIS — I10 Essential (primary) hypertension: Secondary | ICD-10-CM | POA: Insufficient documentation

## 2017-11-17 DIAGNOSIS — Z01812 Encounter for preprocedural laboratory examination: Secondary | ICD-10-CM | POA: Insufficient documentation

## 2017-11-17 LAB — BASIC METABOLIC PANEL
ANION GAP: 12 (ref 5–15)
BUN: 22 mg/dL — ABNORMAL HIGH (ref 6–20)
CALCIUM: 9.1 mg/dL (ref 8.9–10.3)
CHLORIDE: 99 mmol/L — AB (ref 101–111)
CO2: 25 mmol/L (ref 22–32)
Creatinine, Ser: 1.3 mg/dL — ABNORMAL HIGH (ref 0.44–1.00)
GFR calc non Af Amer: 40 mL/min — ABNORMAL LOW (ref >60)
GFR, EST AFRICAN AMERICAN: 46 mL/min — AB (ref >60)
GLUCOSE: 87 mg/dL (ref 65–99)
POTASSIUM: 4.3 mmol/L (ref 3.5–5.1)
Sodium: 136 mmol/L (ref 135–145)

## 2017-11-17 LAB — URINALYSIS, COMPLETE (UACMP) WITH MICROSCOPIC
Bilirubin Urine: NEGATIVE
Glucose, UA: NEGATIVE mg/dL
Ketones, ur: NEGATIVE mg/dL
Leukocytes, UA: NEGATIVE
NITRITE: NEGATIVE
PH: 6 (ref 5.0–8.0)
Protein, ur: NEGATIVE mg/dL
SPECIFIC GRAVITY, URINE: 1.019 (ref 1.005–1.030)
WBC, UA: NONE SEEN WBC/hpf (ref 0–5)

## 2017-11-17 LAB — CBC
HCT: 37.3 % (ref 35.0–47.0)
HEMOGLOBIN: 12.3 g/dL (ref 12.0–16.0)
MCH: 31.2 pg (ref 26.0–34.0)
MCHC: 32.9 g/dL (ref 32.0–36.0)
MCV: 94.9 fL (ref 80.0–100.0)
Platelets: 325 10*3/uL (ref 150–440)
RBC: 3.94 MIL/uL (ref 3.80–5.20)
RDW: 14.8 % — ABNORMAL HIGH (ref 11.5–14.5)
WBC: 9.6 10*3/uL (ref 3.6–11.0)

## 2017-11-17 LAB — SURGICAL PCR SCREEN
MRSA, PCR: NEGATIVE
STAPHYLOCOCCUS AUREUS: NEGATIVE

## 2017-11-17 LAB — APTT: APTT: 31 s (ref 24–36)

## 2017-11-17 LAB — PROTIME-INR
INR: 1.13
PROTHROMBIN TIME: 14.4 s (ref 11.4–15.2)

## 2017-11-17 LAB — SEDIMENTATION RATE: Sed Rate: 76 mm/hr — ABNORMAL HIGH (ref 0–30)

## 2017-11-17 MED ORDER — SEMAGLUTIDE(0.25 OR 0.5MG/DOS) 2 MG/1.5ML ~~LOC~~ SOPN: 0.5000 mg | PEN_INJECTOR | SUBCUTANEOUS | 0 refills | Status: DC

## 2017-11-17 NOTE — Telephone Encounter (Signed)
Patient Insurance does not cover Victoza but suggested alternatives are: Ozempic, Bydureon, Byetta.

## 2017-11-17 NOTE — Patient Instructions (Signed)
Your procedure is scheduled on: 11/19/17 Thurs Report to Same Day Surgery 2nd floor medical mall [facility] Entrance-take elevator on left to 2nd floor.  Check in with surgery information desk.) To find out your arrival time please call [phone] between 1PM - 3PM on 11/18/17 Wed  Remember: Instructions that are not followed completely may result in serious medical risk, up to and including death, or upon the discretion of your surgeon and anesthesiologist your surgery may need to be rescheduled.    _x___ 1. Do not eat food after midnight the night before your procedure. You may drink clear liquids up to 2 hours before you are scheduled to arrive at the hospital for your procedure.  Do not drink clear liquids within 2 hours of your scheduled arrival to the hospital.  Clear liquids include  --Water or Apple juice without pulp  --Clear carbohydrate beverage such as ClearFast or Gatorade  --Black Coffee or Clear Tea (No milk, no creamers, do not add anything to                  the coffee or Tea Type 1 and type 2 diabetics should only drink water.  No gum chewing or hard candies.     __x__ 2. No Alcohol for 24 hours before or after surgery.   __x__3. No Smoking or e-cigarettes for 24 prior to surgery.  Do not use any chewable tobacco products for at least 6 hour prior to surgery   ____  4. Bring all medications with you on the day of surgery if instructed.    __x__ 5. Notify your doctor if there is any change in your medical condition     (cold, fever, infections).    x___6. On the morning of surgery brush your teeth with toothpaste and water.  You may rinse your mouth with mouth wash if you wish.  Do not swallow any toothpaste or mouthwash.   Do not wear jewelry, make-up, hairpins, clips or nail polish.  Do not wear lotions, powders, or perfumes. You may wear deodorant.  Do not shave 48 hours prior to surgery. Men may shave face and neck.  Do not bring valuables to the hospital.     [facility] is not responsible for any belongings or valuables.               Contacts, dentures or bridgework may not be worn into surgery.  Leave your suitcase in the car. After surgery it may be brought to your room.  For patients admitted to the hospital, discharge time is determined by your                       treatment team.  _  Patients discharged the day of surgery will not be allowed to drive home.  You will need someone to drive you home and stay with you the night of your procedure.    Please read over the following fact sheets that you were given:   [facility] Preparing for Surgery and or MRSA Information   _x___ Take anti-hypertensive listed below, cardiac, seizure, asthma,     anti-reflux and psychiatric medicines. These include:  1. metoprolol succinate (TOPROL-XL) 100 MG 24 hr   2.  3.  4.  5.  6.  ____Fleets enema or Magnesium Citrate as directed.   _x___ Use CHG Soap or sage wipes as directed on instruction sheet   ____ Use inhalers on the day of surgery and  bring to hospital day of surgery  ____ Stop Metformin and Janumet 2 days prior to surgery.    ____ Take 1/2 of usual insulin dose the night before surgery and none on the morning     surgery.   _x___ Follow recommendations from Cardiologist, Pulmonologist or PCP regarding          stopping Aspirin, Coumadin, Plavix ,Eliquis, Effient, or Pradaxa, and Pletal.  X____Stop Anti-inflammatories such as Advil, Aleve, Ibuprofen, Motrin, Naproxen, Naprosyn, Goodies powders or aspirin products. OK to take Tylenol and                          Celebrex.   _x___ Stop supplements until after surgery.  But may continue Vitamin D, Vitamin B,       and multivitamin.   _x___ Bring C-Pap to the hospital.

## 2017-11-18 ENCOUNTER — Telehealth: Payer: Self-pay

## 2017-11-18 DIAGNOSIS — T8453XA Infection and inflammatory reaction due to internal right knee prosthesis, initial encounter: Secondary | ICD-10-CM | POA: Diagnosis not present

## 2017-11-18 DIAGNOSIS — Z96651 Presence of right artificial knee joint: Secondary | ICD-10-CM | POA: Diagnosis not present

## 2017-11-18 MED ORDER — DEXTROSE 5 % IV SOLN
3.0000 g | Freq: Once | INTRAVENOUS | Status: AC
  Administered 2017-11-19: 3 g via INTRAVENOUS
  Filled 2017-11-18: qty 3000

## 2017-11-18 NOTE — H&P (Signed)
Chief Complaint: Chief Complaint  Patient presents with  . Follow-up  Right leg swelling and redness, started last Thursday, 10/29/17. Right total knee arthroplasty on 03/11/16   Becky Trevino is a 72 y.o. female who presents today for evaluation of right lower extremity swelling ongoing since Wednesday of last week. The patient is status post a right total knee arthroplasty performed in 2017 by Dr. Rudene Christians. She denies any trauma or injury affecting the right lower extremity, the patient originally noticed swelling along the right ankle and redness along the medial aspect of the right ankle however swelling has continued to increase extending up the leg past the knee. The patient reports increased warmth to the right lower extremity as well. Her pain score today is a 10 out of 10, she does report pain along the popliteal fossa of the right knee. She has increased pain with prolonged periods of walking or standing. She takes a daily baby aspirin, she denies any personal history of blood clot however following surgery she did have to undergo ultrasound to ensure no blood clot was present, this exam was negative. She denies any fevers at home, she is afebrile at today's visit. No recent travel or changes in medication prior to the swelling occurring.  Past Medical History: Past Medical History:  Diagnosis Date  . Diabetes mellitus type 2, uncomplicated (CMS-HCC)  . History of syncope  . Hyperlipidemia, unspecified  . Hypertension  . Obesity, unspecified  . Osteoarthritis  . Sleep apnea   Past Surgical History: Past Surgical History:  Procedure Laterality Date  . ARTHRODESIS POSTERIOR CERVICLE SPINE OCCIPUT-C2 N/A 11/14/2013  Procedure: *C1 and C3 laminectomies, O - C4 fixation and fusion*; Surgeon: Peggyann Shoals, MD; Location: [facility]; Service: Neurosurgery; Laterality: N/A;  . ARTHROPLASTY TOTAL KNEE Right 03/11/2016  Dr.Jaegar Croft  . AUTOGRAFT OBTAINED SAME INCISION FOR SPINE  SURGERY N/A 11/14/2013  Procedure: autograft; Surgeon: Peggyann Shoals, MD; Location: [facility]; Service: Neurosurgery; Laterality: N/A;  . Bunionectomy Bilateral 1993  . COLONOSCOPY 05/23/2011  . HYSTERECTOMY  . HYSTERECTOMY TOTAL ABDOMINAL W/REMOVAL TUBES &/OR OVARIES 1993  fibroids  . INSERTION MORSELIZED BONE ALLOGRAFT FOR SPINE SURGERY N/A 11/14/2013  Procedure: allograft; Surgeon: Peggyann Shoals, MD; Location: [facility]; Service: Neurosurgery; Laterality: N/A;  . INSTRUMENTATION POSTERIOR SPINE 3 TO 6 VERTEBRAL SEGMENTS N/A 11/14/2013  Procedure: O-C4 fixation; Surgeon: Peggyann Shoals, MD; Location: [facility]; Service: Neurosurgery; Laterality: N/A;  . JOINT REPLACEMENT  . LAMINECTOMY POSTERIOR CERVICLE DECOMP W/FACETECTOMY & FORAMINOTOMY N/A 11/14/2013  Procedure: LAMINECTOMY POSTERIOR CERVICAL DECOMP W/FACETECTOMY & FORAMINOTOMY; Surgeon: Peggyann Shoals, MD; Location: [facility]; Service: Neurosurgery; Laterality: N/A;  . LAMINECTOMY POSTERIOR CERVICLE DECOMP W/FACETECTOMY & FORAMINOTOMY N/A 11/14/2013  Procedure: LAMINEC/FACETECT/FORAMIN,EACH ADDNL; Surgeon: Peggyann Shoals, MD; Location: [facility]; Service: Neurosurgery; Laterality: N/A;  . Left knee arthroplasty Left 2011  . Total hip arthroplasty anterior approach Left 12/11/2015  Dr.Sharda Keddy   Past Family History: Family History  Problem Relation Age of Onset  . Heart disease Mother  . Diabetes Mother  . High blood pressure (Hypertension) Mother  . Diabetes Father  . Heart disease Father  . High blood pressure (Hypertension) Father  . Reflux disease Father  . Sleep apnea Father  . Kidney disease Father  . Osteoarthritis Father  . Leukemia Brother  . Diabetes Sister   Medications: Current Outpatient Medications Ordered in Epic  Medication Sig Dispense Refill  . acetaminophen (TYLENOL) 650 MG ER tablet Take 1,300 mg by  mouth as needed for Pain  .  atorvastatin (LIPITOR) 40 MG tablet Take 40 mg by mouth every evening. 3  . blood glucose meter kit Use as directed.  . calcium carbonate-vitamin D3 (CALTRATE 600+D) 600 mg(1,529m) -400 unit tablet Take 1 tablet by mouth 2 (two) times daily with meals.  . FUROsemide (LASIX) 20 MG tablet TAKE 1 TABLET (20 MG TOTAL) BY MOUTH ONCE DAILY AS NEEDED FOR EDEMA. 30 tablet 1  . gabapentin (NEURONTIN) 300 MG capsule Take 300 mg by mouth nightly.  .Marland Kitchenimipramine (TOFRANIL) 25 MG tablet 25 mg nightly. 3  . ketoconazole (NIZORAL) 2 % cream 0  . KLOR-CON 10 10 mEq ER tablet TAKE 1 TABLET (10 MEQ TOTAL) BY MOUTH ONCE DAILY. 60 tablet 0  . lancing device with lancets kit Use 1 each 2 (two) times daily. Use as instructed.  . latanoprost (XALATAN) 0.005 % ophthalmic solution Place 1 drop into both eyes nightly. 3  . magnesium oxide 500 mg Cap Take 1 tablet by mouth once daily.   . methylPREDNISolone (MEDROL DOSEPACK) 4 mg tablet Follow package directions. 21 tablet 0  . metoprolol succinate (TOPROL-XL) 100 MG XL tablet Take 100 mg by mouth once daily. 0  . NON-ASPIRIN EXTRA STRENGTH 500 mg tablet Take 500 mg by mouth every 4 (four) hours as needed. 0  . semaglutide 0.25 mg or 0.5 mg(2 mg/1.5 mL) PnIj Inject subcutaneously.  . traZODone (DESYREL) 50 MG tablet Take 50 mg by mouth nightly as needed.    No current Epic-ordered facility-administered medications on file.   Allergies: Allergies  Allergen Reactions  . Lisinopril Swelling    Review of Systems:  A comprehensive 14 point ROS was performed, reviewed by me today, and the pertinent orthopaedic findings are documented in the HPI.  Exam: BP 114/76  Ht 180.3 cm (5' 11" )  Wt (!) 119.7 kg (264 lb)  LMP (LMP Unknown)  BMI 36.82 kg/m  General/Constitutional: The patient appears to be well-nourished, well-developed, and in no acute distress. Neuro/Psych: Normal mood and affect, oriented to person, place and time. Eyes: Non-icteric. Pupils are equal,  round, and reactive to light, and exhibit synchronous movement. ENT: Unremarkable. Lymphatic: No palpable adenopathy. Respiratory: Non-labored breathing Cardiovascular: No edema, swelling or tenderness, except as noted in detailed exam. Integumentary: No impressive skin lesions present, except as noted in detailed exam. Musculoskeletal: Unremarkable, except as noted in detailed exam.  Skin examination of the right lower extremity demonstrates significant swelling extending from the mid thigh past the knee and into the right ankle. 2+ pitting edema to the right ankle at today's visit, mild erythema along the medial aspect of the right tibia however it appears to be due to venous stasis changes and not an infectious appearance. No erythema surrounding the right knee incision or right knee. She is nontender to palpation along the medial lateral joint line, she is able to fully extend the right knee with mild discomfort, flexion 90 degrees due to swelling. She is able to dorsiflex and plantar flex her right ankle with mild pain, 5+/5 strength to the right ankle. Patient has a positive Homans to the right lower extremity. Lungs clear,  Heart RRR HEENT normall  Imaging: AP, lateral and sunrise views of the right knee were obtained today in the office and reviewed by me. These x-rays demonstrate that the hardware in the right knee is intact, no evidence of loosening or changes of position. No evidence of acute fracture or lytic lesion.  Impression: S/P total  knee replacement, right [Z96.651] S/P total knee replacement, right (primary encounter diagnosis) Right leg swelling  Plan:  1. Treatment options were discussed today with the patient. 2. Dr Turner Daniels drained cyst and there were 80,000 WBC, 98% PMN;s, highly suspicious for infection 3.  Admit today for I+D, ploy exchange and placement of antibiotic beads,, plan consult with Inf [facility]

## 2017-11-18 NOTE — Telephone Encounter (Signed)
Copied from [place] [id number]. Topic: Quick Communication - Lab Results >> Nov 18, 2017 10:22 AM Becky Trevino, CMA wrote: [facility] faxed [place] now Insurance is denying Ozempic as well. They also denied Victoza. Called patient and she was not available, so I spoke to her husband and asked if they could call to see what is covered under her insurance. If she is able to give [place] a list of covered drugs we can submit it to Dr. Ancil Boozer.  >> Nov 18, 2017  2:51 PM Becky Trevino wrote: Patient called back I gave her the message she states that she wasn't going to fool with her insurance to forget about it I advised her that she can just call them to see what's covered under her insurance

## 2017-11-18 NOTE — Telephone Encounter (Signed)
[facility] faxed [place] now Insurance is denying Ozempic as well. They also denied Victoza. Called patient and she was not available, so I spoke to her husband and asked if they could call to see what is covered under her insurance. If she is able to give [place] a list of covered drugs we can submit it to Dr. Ancil Boozer.

## 2017-11-18 NOTE — Telephone Encounter (Signed)
Copied from [place] [id number]. Topic: Quick Communication - Lab Results >> Nov 18, 2017 10:22 AM Becky Trevino, CMA wrote: [facility] fax[facility] [place] now Insurance is denying Ozempic as well. They also denied Victoza. Called patient and she was not available, so I spoke to her husband and asked if they could call to see what is covered under her insurance. If she is able to give [place] a list of covered drugs we can submit it to Dr. Ancil Boozer.  >> Nov 18, 2017  2:51 PM Becky Trevino wrote: Patient called back I gave her the message she states that she wasn't going to fool with her insurance to forget about it I advised her that she can just call them to see what's covered under her insurance

## 2017-11-18 NOTE — Telephone Encounter (Signed)
Routed to the wrong [place]

## 2017-11-18 NOTE — Telephone Encounter (Signed)
Wrong office

## 2017-11-19 ENCOUNTER — Inpatient Hospital Stay: Payer: BLUE CROSS/BLUE SHIELD | Admitting: Anesthesiology

## 2017-11-19 ENCOUNTER — Inpatient Hospital Stay
Admission: RE | Admit: 2017-11-19 | Discharge: 2017-11-22 | DRG: 486 | Disposition: A | Discharge status: Home Health | Payer: BLUE CROSS/BLUE SHIELD | Source: Ambulatory Visit | Attending: Orthopedic Surgery | Admitting: Orthopedic Surgery

## 2017-11-19 ENCOUNTER — Encounter
Admission: RE | Disposition: A | Discharge status: Home Health | Payer: Self-pay | Source: Ambulatory Visit | Attending: Orthopedic Surgery

## 2017-11-19 ENCOUNTER — Other Ambulatory Visit: Payer: Self-pay

## 2017-11-19 ENCOUNTER — Encounter: Payer: Self-pay | Admitting: *Deleted

## 2017-11-19 DIAGNOSIS — Z96642 Presence of left artificial hip joint: Secondary | ICD-10-CM | POA: Diagnosis present

## 2017-11-19 DIAGNOSIS — M7121 Synovial cyst of popliteal space [Baker], right knee: Secondary | ICD-10-CM | POA: Diagnosis not present

## 2017-11-19 DIAGNOSIS — E785 Hyperlipidemia, unspecified: Secondary | ICD-10-CM | POA: Diagnosis not present

## 2017-11-19 DIAGNOSIS — E1122 Type 2 diabetes mellitus with diabetic chronic kidney disease: Secondary | ICD-10-CM | POA: Diagnosis present

## 2017-11-19 DIAGNOSIS — G4733 Obstructive sleep apnea (adult) (pediatric): Secondary | ICD-10-CM | POA: Diagnosis present

## 2017-11-19 DIAGNOSIS — Z6834 Body mass index (BMI) 34.0-34.9, adult: Secondary | ICD-10-CM

## 2017-11-19 DIAGNOSIS — Z96651 Presence of right artificial knee joint: Secondary | ICD-10-CM | POA: Diagnosis not present

## 2017-11-19 DIAGNOSIS — Z79899 Other long term (current) drug therapy: Secondary | ICD-10-CM | POA: Diagnosis not present

## 2017-11-19 DIAGNOSIS — I129 Hypertensive chronic kidney disease with stage 1 through stage 4 chronic kidney disease, or unspecified chronic kidney disease: Secondary | ICD-10-CM | POA: Diagnosis present

## 2017-11-19 DIAGNOSIS — Y831 Surgical operation with implant of artificial internal device as the cause of abnormal reaction of the patient, or of later complication, without mention of misadventure at the time of the procedure: Secondary | ICD-10-CM | POA: Diagnosis present

## 2017-11-19 DIAGNOSIS — E1140 Type 2 diabetes mellitus with diabetic neuropathy, unspecified: Secondary | ICD-10-CM | POA: Diagnosis present

## 2017-11-19 DIAGNOSIS — D62 Acute posthemorrhagic anemia: Secondary | ICD-10-CM | POA: Diagnosis not present

## 2017-11-19 DIAGNOSIS — N183 Chronic kidney disease, stage 3 (moderate): Secondary | ICD-10-CM | POA: Diagnosis not present

## 2017-11-19 DIAGNOSIS — T8453XA Infection and inflammatory reaction due to internal right knee prosthesis, initial encounter: Secondary | ICD-10-CM

## 2017-11-19 DIAGNOSIS — M25561 Pain in right knee: Secondary | ICD-10-CM | POA: Diagnosis present

## 2017-11-19 DIAGNOSIS — Z791 Long term (current) use of non-steroidal anti-inflammatories (NSAID): Secondary | ICD-10-CM | POA: Diagnosis not present

## 2017-11-19 HISTORY — PX: I & D KNEE WITH POLY EXCHANGE: SHX5024

## 2017-11-19 LAB — GLUCOSE, CAPILLARY
GLUCOSE-CAPILLARY: 78 mg/dL (ref 65–99)
GLUCOSE-CAPILLARY: 71 mg/dL (ref 65–99)
GLUCOSE-CAPILLARY: 90 mg/dL (ref 65–99)
Glucose-Capillary: 80 mg/dL (ref 65–99)
Glucose-Capillary: 111 mg/dL — ABNORMAL HIGH (ref 65–99)

## 2017-11-19 LAB — CREATININE, SERUM
CREATININE: 1.1 mg/dL — AB (ref 0.44–1.00)
GFR calc Af Amer: 57 mL/min — ABNORMAL LOW (ref >60)
GFR, EST NON AFRICAN AMERICAN: 49 mL/min — AB (ref >60)

## 2017-11-19 LAB — TYPE AND SCREEN
ABO/RH(D): A POS
ANTIBODY SCREEN: NEGATIVE

## 2017-11-19 LAB — CBC
HCT: 34.6 % — ABNORMAL LOW (ref 35.0–47.0)
HEMOGLOBIN: 11.3 g/dL — AB (ref 12.0–16.0)
MCH: 30.9 pg (ref 26.0–34.0)
MCHC: 32.6 g/dL (ref 32.0–36.0)
MCV: 94.9 fL (ref 80.0–100.0)
Platelets: 306 10*3/uL (ref 150–440)
RBC: 3.65 MIL/uL — AB (ref 3.80–5.20)
RDW: 14.9 % — ABNORMAL HIGH (ref 11.5–14.5)
WBC: 7.4 10*3/uL (ref 3.6–11.0)

## 2017-11-19 LAB — URINE CULTURE

## 2017-11-19 SURGERY — IRRIGATION AND DEBRIDEMENT KNEE WITH POLY EXCHANGE
Anesthesia: Spinal | Site: Knee | Laterality: Right | Wound class: Dirty or Infected

## 2017-11-19 MED ORDER — MIDAZOLAM HCL 5 MG/5ML IJ SOLN
INTRAMUSCULAR | Status: DC | PRN
  Administered 2017-11-19: 2 mg via INTRAVENOUS

## 2017-11-19 MED ORDER — POTASSIUM CHLORIDE CRYS ER 10 MEQ PO TBCR
10.0000 meq | EXTENDED_RELEASE_TABLET | Freq: Every day | ORAL | Status: DC
  Administered 2017-11-19 – 2017-11-22 (×4): 10 meq via ORAL
  Filled 2017-11-19 (×5): qty 1

## 2017-11-19 MED ORDER — KETOCONAZOLE 2 % EX CREA
1.0000 "application " | TOPICAL_CREAM | Freq: Two times a day (BID) | CUTANEOUS | Status: DC | PRN
  Filled 2017-11-19: qty 15

## 2017-11-19 MED ORDER — FENTANYL CITRATE (PF) 100 MCG/2ML IJ SOLN: 25.0000 ug | INTRAMUSCULAR | Status: DC | PRN

## 2017-11-19 MED ORDER — BUPIVACAINE LIPOSOME 1.3 % IJ SUSP
INTRAMUSCULAR | Status: AC
  Filled 2017-11-19: qty 20

## 2017-11-19 MED ORDER — METOCLOPRAMIDE HCL 5 MG/ML IJ SOLN: 5.0000 mg | Freq: Three times a day (TID) | INTRAMUSCULAR | Status: DC | PRN

## 2017-11-19 MED ORDER — MENTHOL 3 MG MT LOZG
1.0000 | LOZENGE | OROMUCOSAL | Status: DC | PRN
  Filled 2017-11-19: qty 9

## 2017-11-19 MED ORDER — PROPOFOL 10 MG/ML IV BOLUS
INTRAVENOUS | Status: DC | PRN
  Administered 2017-11-19: 50 mg via INTRAVENOUS

## 2017-11-19 MED ORDER — INSULIN ASPART 100 UNIT/ML ~~LOC~~ SOLN: 0.0000 [IU] | Freq: Three times a day (TID) | SUBCUTANEOUS | Status: DC

## 2017-11-19 MED ORDER — CELECOXIB 200 MG PO CAPS
200.0000 mg | ORAL_CAPSULE | Freq: Every day | ORAL | Status: DC | PRN
  Administered 2017-11-19 – 2017-11-20 (×2): 200 mg via ORAL
  Filled 2017-11-19 (×2): qty 1

## 2017-11-19 MED ORDER — PROPOFOL 500 MG/50ML IV EMUL
INTRAVENOUS | Status: AC
  Filled 2017-11-19: qty 50

## 2017-11-19 MED ORDER — PROPOFOL 500 MG/50ML IV EMUL
INTRAVENOUS | Status: DC | PRN
  Administered 2017-11-19: 75 ug/kg/min via INTRAVENOUS

## 2017-11-19 MED ORDER — METOCLOPRAMIDE HCL 10 MG PO TABS: 5.0000 mg | ORAL_TABLET | Freq: Three times a day (TID) | ORAL | Status: DC | PRN

## 2017-11-19 MED ORDER — ONDANSETRON HCL 4 MG/2ML IJ SOLN: 4.0000 mg | Freq: Once | INTRAMUSCULAR | Status: DC | PRN

## 2017-11-19 MED ORDER — DIPHENHYDRAMINE HCL 12.5 MG/5ML PO ELIX: 12.5000 mg | ORAL_SOLUTION | ORAL | Status: DC | PRN

## 2017-11-19 MED ORDER — LACTATED RINGERS IV SOLN
INTRAVENOUS | Status: DC | PRN
Start: 2017-11-19 — End: 2017-11-19
  Administered 2017-11-19: 09:00:00 via INTRAVENOUS

## 2017-11-19 MED ORDER — IMIPRAMINE HCL 25 MG PO TABS
25.0000 mg | ORAL_TABLET | Freq: Every evening | ORAL | Status: DC
  Administered 2017-11-19 – 2017-11-21 (×3): 25 mg via ORAL
  Filled 2017-11-19 (×3): qty 1

## 2017-11-19 MED ORDER — OXYCODONE HCL 5 MG PO TABS
5.0000 mg | ORAL_TABLET | ORAL | Status: DC | PRN
  Administered 2017-11-19 (×2): 5 mg via ORAL
  Administered 2017-11-19: 10 mg via ORAL
  Administered 2017-11-20 – 2017-11-22 (×5): 5 mg via ORAL
  Filled 2017-11-19 (×4): qty 1
  Filled 2017-11-19: qty 2
  Filled 2017-11-19 (×3): qty 1

## 2017-11-19 MED ORDER — BUPIVACAINE HCL (PF) 0.5 % IJ SOLN
INTRAMUSCULAR | Status: DC | PRN
  Administered 2017-11-19: 3 mL

## 2017-11-19 MED ORDER — HYDROMORPHONE HCL 1 MG/ML IJ SOLN
0.5000 mg | INTRAMUSCULAR | Status: DC | PRN
  Administered 2017-11-19: 0.5 mg via INTRAVENOUS
  Administered 2017-11-19: 1 mg via INTRAVENOUS
  Filled 2017-11-19 (×2): qty 1

## 2017-11-19 MED ORDER — SODIUM CHLORIDE 0.9 % IV SOLN
2.0000 g | INTRAVENOUS | Status: DC
  Administered 2017-11-19 – 2017-11-21 (×3): 2 g via INTRAVENOUS
  Filled 2017-11-19 (×5): qty 20

## 2017-11-19 MED ORDER — MAGNESIUM CITRATE PO SOLN
1.0000 | Freq: Once | ORAL | Status: DC | PRN
  Filled 2017-11-19: qty 296

## 2017-11-19 MED ORDER — ONDANSETRON HCL 4 MG/2ML IJ SOLN
4.0000 mg | Freq: Four times a day (QID) | INTRAMUSCULAR | Status: DC | PRN
  Administered 2017-11-20: 4 mg via INTRAVENOUS
  Filled 2017-11-19: qty 2

## 2017-11-19 MED ORDER — ENOXAPARIN SODIUM 30 MG/0.3ML ~~LOC~~ SOLN
30.0000 mg | Freq: Two times a day (BID) | SUBCUTANEOUS | Status: DC
  Administered 2017-11-20 – 2017-11-22 (×5): 30 mg via SUBCUTANEOUS
  Filled 2017-11-19 (×6): qty 0.3

## 2017-11-19 MED ORDER — VANCOMYCIN HCL IN DEXTROSE 1-5 GM/200ML-% IV SOLN
1000.0000 mg | Freq: Two times a day (BID) | INTRAVENOUS | Status: DC
  Filled 2017-11-19 (×2): qty 200

## 2017-11-19 MED ORDER — TRANEXAMIC ACID 1000 MG/10ML IV SOLN
1000.0000 mg | Freq: Once | INTRAVENOUS | Status: DC
  Filled 2017-11-19: qty 10

## 2017-11-19 MED ORDER — FAMOTIDINE 20 MG PO TABS
ORAL_TABLET | ORAL | Status: AC
  Administered 2017-11-19: 20 mg via ORAL
  Filled 2017-11-19: qty 1

## 2017-11-19 MED ORDER — ZOLPIDEM TARTRATE 5 MG PO TABS: 5.0000 mg | ORAL_TABLET | Freq: Every evening | ORAL | Status: DC | PRN

## 2017-11-19 MED ORDER — ASPIRIN EC 81 MG PO TBEC
81.0000 mg | DELAYED_RELEASE_TABLET | Freq: Every day | ORAL | Status: DC
  Administered 2017-11-19 – 2017-11-22 (×4): 81 mg via ORAL
  Filled 2017-11-19 (×4): qty 1

## 2017-11-19 MED ORDER — SODIUM CHLORIDE 0.9 % IV SOLN
INTRAVENOUS | Status: DC
  Administered 2017-11-19: 07:00:00 via INTRAVENOUS

## 2017-11-19 MED ORDER — ALUM & MAG HYDROXIDE-SIMETH 200-200-20 MG/5ML PO SUSP: 30.0000 mL | ORAL | Status: DC | PRN

## 2017-11-19 MED ORDER — LATANOPROST 0.005 % OP SOLN
1.0000 [drp] | Freq: Every day | OPHTHALMIC | Status: DC
  Administered 2017-11-19 – 2017-11-21 (×2): 1 [drp] via OPHTHALMIC
  Filled 2017-11-19: qty 2.5

## 2017-11-19 MED ORDER — NEOMYCIN-POLYMYXIN B GU 40-200000 IR SOLN
Status: AC
  Filled 2017-11-19: qty 20

## 2017-11-19 MED ORDER — FLUTICASONE PROPIONATE 50 MCG/ACT NA SUSP
2.0000 | Freq: Every day | NASAL | Status: DC | PRN
  Filled 2017-11-19: qty 16

## 2017-11-19 MED ORDER — SODIUM CHLORIDE 0.9 % IV SOLN
INTRAVENOUS | Status: DC
  Administered 2017-11-19 – 2017-11-20 (×2): via INTRAVENOUS

## 2017-11-19 MED ORDER — PHENOL 1.4 % MT LIQD
1.0000 | OROMUCOSAL | Status: DC | PRN
  Filled 2017-11-19: qty 177

## 2017-11-19 MED ORDER — VANCOMYCIN HCL 10 G IV SOLR
1250.0000 mg | Freq: Once | INTRAVENOUS | Status: AC
  Administered 2017-11-19: 1250 mg via INTRAVENOUS
  Filled 2017-11-19: qty 1250

## 2017-11-19 MED ORDER — SODIUM CHLORIDE 0.9 % IV SOLN
INTRAVENOUS | Status: DC | PRN
  Administered 2017-11-19: 40 ug/min via INTRAVENOUS

## 2017-11-19 MED ORDER — METHOCARBAMOL 500 MG PO TABS
500.0000 mg | ORAL_TABLET | Freq: Four times a day (QID) | ORAL | Status: DC | PRN
  Administered 2017-11-19 – 2017-11-20 (×3): 500 mg via ORAL
  Filled 2017-11-19 (×2): qty 1

## 2017-11-19 MED ORDER — NEOMYCIN-POLYMYXIN B GU 40-200000 IR SOLN
Status: DC | PRN
  Administered 2017-11-19: 4 mL

## 2017-11-19 MED ORDER — DOCUSATE SODIUM 100 MG PO CAPS
100.0000 mg | ORAL_CAPSULE | Freq: Two times a day (BID) | ORAL | Status: DC
  Administered 2017-11-19 – 2017-11-22 (×6): 100 mg via ORAL
  Filled 2017-11-19 (×5): qty 1

## 2017-11-19 MED ORDER — SODIUM CHLORIDE 0.9 % IV SOLN
1250.0000 mg | INTRAVENOUS | Status: DC
  Administered 2017-11-19: 1250 mg via INTRAVENOUS
  Filled 2017-11-19 (×2): qty 1250

## 2017-11-19 MED ORDER — VANCOMYCIN HCL 1000 MG IV SOLR
INTRAVENOUS | Status: AC
  Filled 2017-11-19: qty 1000

## 2017-11-19 MED ORDER — TRAZODONE HCL 50 MG PO TABS
50.0000 mg | ORAL_TABLET | Freq: Every day | ORAL | Status: DC
  Administered 2017-11-19 – 2017-11-21 (×3): 50 mg via ORAL
  Filled 2017-11-19 (×3): qty 1

## 2017-11-19 MED ORDER — CALCIUM CARBONATE-VITAMIN D 500-200 MG-UNIT PO TABS
1.0000 | ORAL_TABLET | Freq: Two times a day (BID) | ORAL | Status: DC
  Administered 2017-11-19 – 2017-11-22 (×6): 1 via ORAL
  Filled 2017-11-19 (×6): qty 1

## 2017-11-19 MED ORDER — FAMOTIDINE 20 MG PO TABS
20.0000 mg | ORAL_TABLET | Freq: Once | ORAL | Status: AC
  Administered 2017-11-19: 20 mg via ORAL

## 2017-11-19 MED ORDER — MIDAZOLAM HCL 2 MG/2ML IJ SOLN
INTRAMUSCULAR | Status: AC
  Filled 2017-11-19: qty 2

## 2017-11-19 MED ORDER — MAGNESIUM OXIDE 400 (241.3 MG) MG PO TABS
400.0000 mg | ORAL_TABLET | Freq: Every day | ORAL | Status: DC
  Administered 2017-11-19 – 2017-11-22 (×4): 400 mg via ORAL
  Filled 2017-11-19 (×4): qty 1

## 2017-11-19 MED ORDER — BISACODYL 10 MG RE SUPP
10.0000 mg | Freq: Every day | RECTAL | Status: DC | PRN
  Administered 2017-11-21: 10 mg via RECTAL
  Filled 2017-11-19: qty 1

## 2017-11-19 MED ORDER — FUROSEMIDE 20 MG PO TABS
20.0000 mg | ORAL_TABLET | Freq: Every day | ORAL | Status: DC
  Administered 2017-11-19 – 2017-11-22 (×4): 20 mg via ORAL
  Filled 2017-11-19 (×4): qty 1

## 2017-11-19 MED ORDER — GABAPENTIN 300 MG PO CAPS
300.0000 mg | ORAL_CAPSULE | Freq: Every evening | ORAL | Status: DC
  Administered 2017-11-19 – 2017-11-21 (×3): 300 mg via ORAL
  Filled 2017-11-19 (×3): qty 1

## 2017-11-19 MED ORDER — ONDANSETRON HCL 4 MG PO TABS: 4.0000 mg | ORAL_TABLET | Freq: Four times a day (QID) | ORAL | Status: DC | PRN

## 2017-11-19 MED ORDER — SENNOSIDES-DOCUSATE SODIUM 8.6-50 MG PO TABS: 1.0000 | ORAL_TABLET | Freq: Every evening | ORAL | Status: DC | PRN

## 2017-11-19 MED ORDER — METHOCARBAMOL 1000 MG/10ML IJ SOLN
500.0000 mg | Freq: Four times a day (QID) | INTRAMUSCULAR | Status: DC | PRN
  Filled 2017-11-19: qty 5

## 2017-11-19 MED ORDER — METOPROLOL SUCCINATE ER 50 MG PO TB24
100.0000 mg | ORAL_TABLET | Freq: Every evening | ORAL | Status: DC
  Administered 2017-11-19 – 2017-11-21 (×2): 100 mg via ORAL
  Filled 2017-11-19 (×2): qty 2

## 2017-11-19 MED ORDER — SODIUM CHLORIDE 0.9 % IJ SOLN
INTRAMUSCULAR | Status: AC
  Filled 2017-11-19: qty 50

## 2017-11-19 MED ORDER — ATORVASTATIN CALCIUM 20 MG PO TABS
40.0000 mg | ORAL_TABLET | Freq: Every evening | ORAL | Status: DC
  Administered 2017-11-19 – 2017-11-21 (×3): 40 mg via ORAL
  Filled 2017-11-19 (×3): qty 2

## 2017-11-19 MED ORDER — SEMAGLUTIDE(0.25 OR 0.5MG/DOS) 2 MG/1.5ML ~~LOC~~ SOPN: 0.5000 mg | PEN_INJECTOR | SUBCUTANEOUS | Status: DC

## 2017-11-19 SURGICAL SUPPLY — 53 items
BANDAGE ACE 6X5 VEL STRL LF (GAUZE/BANDAGES/DRESSINGS) ×2 IMPLANT
CANISTER SUCT 1200ML W/VALVE (MISCELLANEOUS) ×2 IMPLANT
CANISTER SUCT 3000ML PPV (MISCELLANEOUS) ×2 IMPLANT
CHLORAPREP W/TINT 26ML (MISCELLANEOUS) ×2 IMPLANT
CUFF TOURN 24 STER (MISCELLANEOUS) IMPLANT
CUFF TOURN 30 STER DUAL PORT (MISCELLANEOUS) IMPLANT
DECANTER SPIKE VIAL GLASS SM (MISCELLANEOUS) ×2 IMPLANT
DRAPE INCISE IOBAN 66X45 STRL (DRAPES) ×2 IMPLANT
DRSG EMULSION OIL 3X8 NADH (GAUZE/BANDAGES/DRESSINGS) ×2 IMPLANT
ELECT CAUTERY BLADE 6.4 (BLADE) ×2 IMPLANT
ELECT REM PT RETURN 9FT ADLT (ELECTROSURGICAL) ×2
ELECTRODE REM PT RTRN 9FT ADLT (ELECTROSURGICAL) ×1 IMPLANT
GAUZE PETRO XEROFOAM 1X8 (MISCELLANEOUS) ×2 IMPLANT
GAUZE SPONGE 4X4 12PLY STRL (GAUZE/BANDAGES/DRESSINGS) ×2 IMPLANT
GLOVE BIOGEL PI IND STRL 8 (GLOVE) ×1 IMPLANT
GLOVE BIOGEL PI IND STRL 9 (GLOVE) ×1 IMPLANT
GLOVE BIOGEL PI INDICATOR 8 (GLOVE) ×1
GLOVE BIOGEL PI INDICATOR 9 (GLOVE) ×1
GLOVE SURG ORTHO 8.0 STRL STRW (GLOVE) ×2 IMPLANT
GLOVE SURG SYN 9.0  PF PI (GLOVE) ×1
GLOVE SURG SYN 9.0 PF PI (GLOVE) ×1 IMPLANT
GOWN SRG 2XL LVL 4 RGLN SLV (GOWNS) ×1 IMPLANT
GOWN STRL NON-REIN 2XL LVL4 (GOWNS) ×1
GOWN STRL REUS W/ TWL LRG LVL3 (GOWN DISPOSABLE) ×1 IMPLANT
GOWN STRL REUS W/ TWL XL LVL3 (GOWN DISPOSABLE) ×1 IMPLANT
GOWN STRL REUS W/TWL LRG LVL3 (GOWN DISPOSABLE) ×1
GOWN STRL REUS W/TWL XL LVL3 (GOWN DISPOSABLE) ×1
HANDPIECE VERSAJET DEBRIDEMENT (MISCELLANEOUS) ×2 IMPLANT
HOOD PEEL AWAY FLYTE STAYCOOL (MISCELLANEOUS) ×4 IMPLANT
IMMBOLIZER KNEE 19 BLUE UNIV (SOFTGOODS) ×2 IMPLANT
INSERT TIBIAL SZ4 RIGHT (Miscellaneous) ×2 IMPLANT
KIT PREVENA INCISION MGT20CM45 (CANNISTER) ×2 IMPLANT
KIT STIMULAN RAPID CURE 5CC (Orthopedic Implant) ×4 IMPLANT
KIT TURNOVER KIT A (KITS) ×2 IMPLANT
LABEL OR SOLS (LABEL) ×2 IMPLANT
NDL SAFETY ECLIPSE 18X1.5 (NEEDLE) ×1 IMPLANT
NEEDLE HYPO 18GX1.5 SHARP (NEEDLE) ×1
NEEDLE SPNL 20GX3.5 QUINCKE YW (NEEDLE) ×2 IMPLANT
NS IRRIG 1000ML POUR BTL (IV SOLUTION) ×2 IMPLANT
PACK TOTAL KNEE (MISCELLANEOUS) ×2 IMPLANT
PAD WRAPON POLAR KNEE (MISCELLANEOUS) ×1 IMPLANT
SOL PREP PVP 2OZ (MISCELLANEOUS) ×2
SOLUTION PREP PVP 2OZ (MISCELLANEOUS) ×1 IMPLANT
STAPLER SKIN PROX 35W (STAPLE) ×2 IMPLANT
SUCTION FRAZIER HANDLE 10FR (MISCELLANEOUS) ×1
SUCTION TUBE FRAZIER 10FR DISP (MISCELLANEOUS) ×1 IMPLANT
SUT DVC 2 QUILL PDO  T11 36X36 (SUTURE) ×1
SUT DVC 2 QUILL PDO T11 36X36 (SUTURE) ×1 IMPLANT
SUT DVC QUILL MONODERM 30X30 (SUTURE) ×2 IMPLANT
SYR 20CC LL (SYRINGE) ×2 IMPLANT
SYR 50ML LL SCALE MARK (SYRINGE) ×2 IMPLANT
TRAY FOLEY W/METER SILVER 16FR (SET/KITS/TRAYS/PACK) ×2 IMPLANT
WRAPON POLAR PAD KNEE (MISCELLANEOUS) ×2

## 2017-11-19 NOTE — Op Note (Signed)
11/19/2017  9:39 AM  PATIENT:  Becky Trevino  72 y.o. female  PRE-OPERATIVE DIAGNOSIS: Infected total knee right  POST-OPERATIVE DIAGNOSIS: Infected total knee Right  PROCEDURE:  Procedure(s): RIGHT KNEE POLY EXCHANGE WITH IRRIGATION AND DEBRIDEMENT (Right)  SURGEON: Laurene Footman, MD  ASSISTANTS: Rachelle Hora PA-C  ANESTHESIA:   spinal  EBL:  Total I/O In: 1100 [I.V.:1100] Out: -   BLOOD ADMINISTERED:none  DRAINS: none   LOCAL MEDICATIONS USED:  NONE  SPECIMEN:  Source of Specimen:  Culture of synovial fluid x2 and synovial tissue culture x3  DISPOSITION OF SPECIMEN:  PATHOLOGY  COUNTS:  YES  TOURNIQUET:  * Missing tourniquet times found for documented tourniquets in log: [id number] *  IMPLANTS: Medacta TNKase sphere for insert right for 17 mm  DICTATION: .Dragon Dictation patient was brought to the operating room and after adequate anesthesia was obtained the right leg was prepped and draped in sterile fashion.  After patient identification and timeout procedures were completed the prior midline incision was elliptical scar was elliptically excised and a medial parapatellar arthrotomy performed with extensive scar tissue noted.  On entering the joint joint fluid was sent for culture and a second fluid culture from the more lateral aspect of the knee was also obtained.  After the arthrotomy was completed synovium was debrided with use of a rongeur and 3 separate synovial specimens were sent for culture as well.  At this point the knee was noted to have extensive synovitis present within the joint and this was debrided with use of a rongeur by dissection with a cautery and sharp dissection.  After adequate initial debridement a versa jet was used to irrigate the certain the services of first and the metal implants followed by the bone the prior poly-insert was removed and the Baker's cyst was pressed on and fluid came out and extensive synovium was removed from the posterior  aspect of the femoral condyles.  The versa jet was used to clean this posterior aspect as well as the surface of the tibial component following this the versa jet was used to clean the bone and then in a lower setting the lining of the knee in the soft tissues including the area around the patella when the synovitis had been adequately excised the knee was irrigated with 3 L of Betadine solution leaving it 5 minutes to sit prior to finishing the irrigation following this the insert was placed the prior insert had been a 14 mm but there was varus valgus instability in the 17 mm insert gave excellent stability with a set screw being placed using the torque screwdriver.  Following this the knee was irrigated with 3 3 L of antibiotic irrigation and when this was completed the medial and beads with vancomycin were placed in the gutters to give a local concentration higher in the joint postoperatively.  The arthrotomy was repaired using a heavy Quill followed by 3-0 V lock subcutaneously skin staples and a incisional wound VAC followed by Ace wraps  PLAN OF CARE: Admit to inpatient   PATIENT DISPOSITION:  PACU - hemodynamically stable.

## 2017-11-19 NOTE — Anesthesia Preprocedure Evaluation (Signed)
Anesthesia Evaluation  Patient identified by MRN, date of birth, ID band Patient awake    Reviewed: Allergy & Precautions, NPO status , Patient's Chart, lab work & pertinent test results  History of Anesthesia Complications Negative for: history of anesthetic complications  Airway Mallampati: III       Dental  (+) Upper Dentures, Lower Dentures   Pulmonary neg shortness of breath, sleep apnea and Continuous Positive Airway Pressure Ventilation , neg COPD, neg recent URI,           Cardiovascular Exercise Tolerance: Poor hypertension, Pt. on medications (-) angina+ Peripheral Vascular Disease  (-) CAD, (-) Past MI, (-) Cardiac Stents and (-) CABG (-) dysrhythmias (-) Valvular Problems/Murmurs     Neuro/Psych neg Seizures Detrusor muscle hypertonia  Neuromuscular disease negative psych ROS   GI/Hepatic negative GI ROS, Neg liver ROS,   Endo/Other  diabetes, Well Controlled, Type 2, Oral Hypoglycemic Agents  Renal/GU Renal InsufficiencyRenal disease     Musculoskeletal  (+) Arthritis , Osteoarthritis,    Abdominal (+) + obese,   Peds negative pediatric ROS (+)  Hematology negative hematology ROS (+)   Anesthesia Other Findings Past Medical History: No date: Cervical spondylosis No date: Chronic kidney disease     Comment:  Stage 3 No date: DDD (degenerative disc disease), cervical     Comment:  [facility] No date: Diabetes mellitus without complication (HCC) No date: Diverticulosis No date: Hyperlipidemia No date: Hypertension No date: Intertrigo No date: Leg cramps No date: Leg varices No date: Obesity No date: OSA (obstructive sleep apnea) No date: Symptomatic menopausal or female climacteric states   Reproductive/Obstetrics negative OB ROS                             Anesthesia Physical  Anesthesia Plan  ASA: III  Anesthesia Plan: Spinal   Post-op Pain Management:     Induction:   PONV Risk Score and Plan: 2 and Propofol infusion  Airway Management Planned: Nasal Cannula  Additional Equipment:   Intra-op Plan:   Post-operative Plan:   Informed Consent: I have reviewed the patients History and Physical, chart, labs and discussed the procedure including the risks, benefits and alternatives for the proposed anesthesia with the patient or authorized representative who has indicated his/her understanding and acceptance.     Plan Discussed with: CRNA  Anesthesia Plan Comments:         Anesthesia Quick Evaluation

## 2017-11-19 NOTE — Anesthesia Procedure Notes (Signed)
Spinal  Patient location during procedure: OR Start time: 11/19/2017 7:25 AM End time: 11/19/2017 7:39 AM Staffing Anesthesiologist: Martha Clan, MD Resident/CRNA: Nelda Marseille, CRNA Performed: resident/CRNA  Preanesthetic Checklist Completed: patient identified, site marked, surgical consent, pre-op evaluation, timeout performed, IV checked, risks and benefits discussed and monitors and equipment checked Spinal Block Patient position: sitting Prep: ChloraPrep Patient monitoring: heart rate, continuous pulse ox, blood pressure and cardiac monitor Approach: right paramedian Location: L3-4 Injection technique: single-shot Needle Needle type: Whitacre and Introducer  Needle gauge: 24 G Needle length: 9 cm Assessment Sensory level: T10 Additional Notes Negative paresthesia. Negative blood return. Positive free-flowing CSF. Expiration date of kit checked and confirmed. Patient tolerated procedure well, without complications.

## 2017-11-19 NOTE — Consult Note (Signed)
[facility] Infectious Disease     Reason for Consult:PJI     Referring Physician: Joni Reining Date of Admission:  11/19/2017   Active Problems:   Infection of total right knee replacement ([place])   HPI: Becky Trevino is a 72 y.o. female admitted with R TKR infection The patient is status post a right total knee arthroplasty performed in 2017 by Dr. Rudene Christians. She denies any trauma or injury affecting the right lower extremity, the patient originally noticed swelling along the right ankle and redness along the medial aspect of the right ankle however swelling has continued to increase extending up the leg past the knee On 2/28 aspiration had 59 K wbc 98% pmns, crystals neg, cx neg.     Past Medical History:  Diagnosis Date  . Cervical spondylosis   . Chronic kidney disease    Stage 3  . DDD (degenerative disc disease), cervical    [facility]  . Diabetes mellitus without complication ([place])   . Diverticulosis   . Hyperlipidemia   . Hypertension   . Intertrigo   . Leg cramps   . Leg varices   . Obesity   . OSA (obstructive sleep apnea)   . Symptomatic menopausal or female climacteric states    Past Surgical History:  Procedure Laterality Date  . ABDOMINAL HYSTERECTOMY  1993   Total  . BUNIONECTOMY Bilateral 1993  . JOINT REPLACEMENT     bilateral knee  . LAMINECTOMY  11/14/2013    Cervical Fusion , [facility], Dr. Delilah Shan  . LASER ABLATION Bilateral 07/29/2012   Dr. Lucky Cowboy  . REPLACEMENT TOTAL KNEE Left 03/2010   Dr. Rudene Christians  . TOTAL HIP ARTHROPLASTY Left 12/11/2015   Procedure: TOTAL HIP ARTHROPLASTY ANTERIOR APPROACH;  Surgeon: Hessie Knows, MD;  Location: [facility] ORS;  Service: Orthopedics;  Laterality: Left;  . TOTAL KNEE ARTHROPLASTY Right 03/11/2016   Procedure: TOTAL KNEE ARTHROPLASTY;  Surgeon: Hessie Knows, MD;  Location: [facility] ORS;  Service: Orthopedics;  Laterality: Right;   Social History   Tobacco Use  . Smoking status: Never Smoker  . Smokeless tobacco: Never Used   Substance Use Topics  . Alcohol use: No    Alcohol/week: 0.0 oz  . Drug use: No   Family History  Problem Relation Age of Onset  . Diabetes Mother   . Hyperlipidemia Mother   . Hypertension Mother   . Diabetes Father   . Hyperlipidemia Father   . Hypertension Father   . Obesity Father   . Hypertension Sister   . Hyperlipidemia Sister   . Hyperlipidemia Sister   . Breast cancer Neg Hx     Allergies:  Allergies  Allergen Reactions  . Lisinopril Swelling    Current antibiotics: Antibiotics Given (last 72 hours)    Date/Time Action Medication Dose   11/19/17 0817 New Bag/Given   ceFAZolin (ANCEF) 3 g in dextrose 5 % 50 mL IVPB 3 g      MEDICATIONS: . aspirin EC  81 mg Oral Daily  . atorvastatin  40 mg Oral QPM  . calcium-vitamin D  1 tablet Oral BID  . docusate sodium  100 mg Oral BID  . [START ON 11/20/2017] enoxaparin (LOVENOX) injection  30 mg Subcutaneous Q12H  . furosemide  20 mg Oral Daily  . gabapentin  300 mg Oral QPM  . imipramine  25 mg Oral QPM  . insulin aspart  0-15 Units Subcutaneous TID WC  . latanoprost  1 drop Both Eyes QHS  . magnesium  oxide  400 mg Oral Daily  . metoprolol succinate  100 mg Oral QPM  . potassium chloride  10 mEq Oral Daily  . Semaglutide  0.5 mg Subcutaneous Weekly  . traZODone  50 mg Oral QHS    Review of Systems - 11 systems reviewed and negative per HPI   OBJECTIVE: Temp:  [97.3 F (36.3 C)-99.2 F (37.3 C)] 97.7 F (36.5 C) (03/07 1205) Pulse Rate:  [77-88] 86 (03/07 1205) Resp:  [12-20] 16 (03/07 1124) BP: (97-129)/(64-87) 125/64 (03/07 1205) SpO2:  [96 %-100 %] 100 % (03/07 1205) Weight:  [113.4 kg (250 lb)] 113.4 kg (250 lb) (03/07 3790) Physical Exam  Constitutional:  oriented to person, place, and time. appears well-developed and well-nourished. No distress.  HENT: [place]/AT, PERRLA, no scleral icterus Mouth/Throat: Oropharynx is clear and moist. No oropharyngeal exudate.  Cardiovascular: Normal rate, regular  rhythm and normal heart sounds. Exam reveals no gallop and no friction rub.  No murmur heard.  Pulmonary/Chest: Effort normal and breath sounds normal. No respiratory distress.  has no wheezes.  Neck = supple, no nuchal rigidity Abdominal: Soft. Bowel sounds are normal.  exhibits no distension. There is no tenderness.  Lymphadenopathy: no cervical adenopathy. No axillary adenopathy Neurological: alert and oriented to person, place, and time.  Ext R knee wrapped post op Skin: Skin is warm and dry. No rash noted. No erythema.  Psychiatric: a normal mood and affect.  behavior is normal.    LABS: Results for orders placed or performed during the hospital encounter of 11/19/17 (from the past 48 hour(s))  Glucose, capillary     Status: None   Collection Time: 11/19/17  6:23 AM  Result Value Ref Range   Glucose-Capillary 78 65 - 99 mg/dL  Glucose, capillary     Status: None   Collection Time: 11/19/17 10:15 AM  Result Value Ref Range   Glucose-Capillary 80 65 - 99 mg/dL  Glucose, capillary     Status: None   Collection Time: 11/19/17 11:45 AM  Result Value Ref Range   Glucose-Capillary 71 65 - 99 mg/dL   Comment 1 Notify RN    No components found for: ESR, C REACTIVE PROTEIN MICRO: Recent Results (from the past 720 hour(s))  Body fluid culture     Status: None   Collection Time: 11/06/17  1:42 PM  Result Value Ref Range Status   Specimen Description   Final    SYNOVIAL Performed at [facility], [address] [place], [place] [address]    Special Requests   Final    RIGHT KNEE Performed at [facility], [address] [place], [place] [address]    Gram Stain   Final    RARE WBC PRESENT, PREDOMINANTLY PMN NO ORGANISMS SEEN    Culture   Final    NO GROWTH 3 DAYS Performed at [facility], [place] [address] [place], [place] [address]    Report Status 11/10/2017 FINAL  Final  Body fluid culture     Status: None   Collection Time: 11/12/17  4:00 PM   Result Value Ref Range Status   Specimen Description   Final    SYNOVIAL right knee Performed at [facility], [address], [place],  [address]    Special Requests   Final    NONE Performed at [facility], [address] [place],  [address]    Gram Stain   Final    ABUNDANT WBC PRESENT, PREDOMINANTLY PMN  NO ORGANISMS SEEN    Culture   Final    NO GROWTH 3 DAYS Performed at [facility], [place] [address], [place], [place] [address]    Report Status 11/16/2017 FINAL  Final  Urine culture     Status: Abnormal   Collection Time: 11/17/17  3:10 PM  Result Value Ref Range Status   Specimen Description   Final    URINE, RANDOM Performed at [facility], [address] [place], [place] [address]    Special Requests   Final    NONE Performed at [facility], [address] [place], [place] [address]    Culture MULTIPLE SPECIES PRESENT, SUGGEST RECOLLECTION (A)  Final   Report Status 11/19/2017 FINAL  Final  Surgical pcr screen     Status: None   Collection Time: 11/17/17  3:10 PM  Result Value Ref Range Status   MRSA, PCR NEGATIVE NEGATIVE Final   Staphylococcus aureus NEGATIVE NEGATIVE Final    Comment: (NOTE) The Xpert SA Assay (FDA approved for NASAL specimens in patients 39 years of age and older), is one component of a comprehensive surveillance program. It is not intended to diagnose infection nor to guide or monitor treatment. Performed at [facility], [address] [place], [place] [address]     IMAGING: US Venous Img Lower Unilateral Right  Result Date: 11/05/2017 CLINICAL DATA:  Right leg swelling EXAM: RIGHT LOWER EXTREMITY VENOUS DOPPLER ULTRASOUND TECHNIQUE: Gray-scale sonography with graded compression, as well as color Doppler and duplex ultrasound were performed to evaluate the lower extremity deep venous systems from the level of the common femoral vein and including the common  femoral, femoral, profunda femoral, popliteal and calf veins including the posterior tibial, peroneal and gastrocnemius veins when visible. The superficial great saphenous vein was also interrogated. Spectral Doppler was utilized to evaluate flow at rest and with distal augmentation maneuvers in the common femoral, femoral and popliteal veins. COMPARISON:  None. FINDINGS: Contralateral Common Femoral Vein: Respiratory phasicity is normal and symmetric with the symptomatic side. No evidence of thrombus. Normal compressibility. Common Femoral Vein: No evidence of thrombus. Normal compressibility, respiratory phasicity and response to augmentation. Saphenofemoral Junction: No evidence of thrombus. Normal compressibility and flow on color Doppler imaging. Profunda Femoral Vein: No evidence of thrombus. Normal compressibility and flow on color Doppler imaging. Femoral Vein: No evidence of thrombus. Normal compressibility, respiratory phasicity and response to augmentation. Popliteal Vein: No evidence of thrombus. Normal compressibility, respiratory phasicity and response to augmentation. Calf Veins: No evidence of thrombus. Normal compressibility and flow on color Doppler imaging. Superficial Great Saphenous Vein: No evidence of thrombus. Normal compressibility. Venous Reflux:  None. Other Findings: Cystic lesion in the popliteal fossa is noted which measures approximately 20 cm in length but 5.4 cm in width. This is consistent with an extensive popliteal cyst. This could be related to a ruptured popliteal cyst. Diffuse calf edema is noted. Note is made of multiple varices in the region of the right groin. IMPRESSION: No evidence of deep venous thrombosis. Large popliteal cyst as described. These may represent sequelae from a ruptured popliteal cyst given the extensive nature extending into the calf. Calf edema is noted. Multiple right groin varices are seen. Electronically Signed   By: Inez Catalina M.D.   On: 11/05/2017  16:28    Assessment:   CHIEKO NEISES is a 72 y.o. female with R Knee Prosthetic joint infection onset over the last 2 weeks with no obvious  injury or source of infection. Denies recent procedures, infections or dental work.  ESR 76, aspirate cx done 2/22 and 2/28 negative.  3/7 underwent I and D and poly exchange.  Repeat cx pending.  Recommendations Plan is for prosthesis retention and then suppression. If fails may need 2 stage revision.  Start vanco and ceftriaxone Place PICC line. WIll plan on 6 weeks IV abx- If cx positive can tailor abx to culture results. Thank you very much for allowing me to participate in the care of this patient. Please call with questions.   Cheral Marker. Ola Spurr, MD

## 2017-11-19 NOTE — Anesthesia Post-op Follow-up Note (Signed)
Anesthesia QCDR form completed.        

## 2017-11-19 NOTE — Transfer of Care (Signed)
Immediate Anesthesia Transfer of Care Note  Patient: Becky Trevino  Procedure(s) Performed: RIGHT KNEE POLY EXCHANGE WITH IRRIGATION AND DEBRIDEMENT (Right Knee)  Patient Location: PACU  Anesthesia Type:Spinal  Level of Consciousness: sedated  Airway & Oxygen Therapy: Patient Spontanous Breathing and Patient connected to face mask oxygen  Post-op Assessment: Report given to RN and Post -op Vital signs reviewed and stable  Post vital signs: Reviewed and stable  Last Vitals:  Vitals:   11/19/17 0620  BP: 125/71  Pulse: 88  Resp: 12  Temp: 36.5 C  SpO2: 99%    Last Pain:  Vitals:   11/19/17 0620  TempSrc: Tympanic  PainSc: 8          Complications: No apparent anesthesia complications

## 2017-11-19 NOTE — OR Nursing (Signed)
Explanted tibial insert right knee

## 2017-11-19 NOTE — NC FL2 (Signed)
[place] LEVEL OF CARE SCREENING TOOL     IDENTIFICATION  Patient Name: Becky Trevino Birthdate: 01/07/1946 Sex: female Admission Date (Current Location): 11/19/2017  [place] and [place] Number:  Engineering geologist and Address:  [facility], [address], [place], [place] [address]      Provider Number: [id number]  Attending Physician Name and Address:  Hessie Knows, MD  Relative Name and Phone Number:       Current Level of Care: Hospital Recommended Level of Care: [place] Prior Approval Number:    Date Approved/Denied:   PASRR Number: [id number])  Discharge Plan: SNF    Current Diagnoses: Patient Active Problem List   Diagnosis Date Noted  . Infection of total right knee replacement ([place]) 11/19/2017  . Type 2 diabetes mellitus with diabetic neuropathy, without long-term current use of insulin ([place]) 11/06/2016  . Primary osteoarthritis of knee 03/11/2016  . Primary osteoarthritis of left hip 12/11/2015  . Benign essential HTN 04/21/2015  . Edema leg 04/21/2015  . Chronic kidney disease (CKD), stage III (moderate) ([place]) 04/21/2015  . Chronic venous insufficiency 04/21/2015  . DDD (degenerative disc disease), cervical 04/21/2015  . Diabetes mellitus with renal manifestation ([place]) 04/21/2015  . Dyslipidemia 04/21/2015  . Bladder cystocele 04/21/2015  . Urinary incontinence 04/21/2015  . Leg varices 04/21/2015  . Insomnia 04/21/2015  . Eczema intertrigo 04/21/2015  . Obesity, morbid ([place]) 04/21/2015  . Obstructive apnea 04/21/2015  . Menopausal and perimenopausal disorder 04/21/2015  . Supraventricular premature beats 04/21/2015  . Osteoarthritis of multiple joints 04/21/2015  . H/O Spinal surgery 11/14/2013  . Detrusor muscle hypertonia 11/07/2013    Orientation RESPIRATION BLADDER Height & Weight     Self, Time, Situation, Place  Normal Continent Weight: 250 lb (113.4 kg) Height:  5\' 11"   (180.3 cm)  BEHAVIORAL SYMPTOMS/MOOD NEUROLOGICAL BOWEL NUTRITION STATUS      Continent Diet(Diet: Carb Modifed. )  AMBULATORY STATUS COMMUNICATION OF NEEDS Skin   Extensive Assist Verbally Surgical wounds, Wound Vac(Incision: Right Knee. provena wound vac. )                       Personal Care Assistance Level of Assistance  Bathing, Feeding, Dressing Bathing Assistance: Limited assistance Feeding assistance: Independent Dressing Assistance: Limited assistance     Functional Limitations Info  Sight, Hearing, Speech Sight Info: Adequate Hearing Info: Adequate Speech Info: Adequate    SPECIAL CARE FACTORS FREQUENCY  PT (By licensed PT), OT (By licensed OT)     PT Frequency: (5) OT Frequency: (5)            Contractures      Additional Factors Info  Code Status, Allergies Code Status Info: (Full Code. ) Allergies Info: (Lisinopril)           Current Medications (11/19/2017):  This is the current hospital active medication list Current Facility-Administered Medications  Medication Dose Route Frequency Provider Last Rate Last Dose  . 0.9 %  sodium chloride infusion   Intravenous Continuous Hessie Knows, MD 100 mL/hr at 11/19/17 1232    . alum & mag hydroxide-simeth (MAALOX/MYLANTA) 200-200-20 MG/5ML suspension 30 mL  30 mL Oral Q4H PRN Hessie Knows, MD      . aspirin EC tablet 81 mg  81 mg Oral Daily Hessie Knows, MD   81 mg at 11/19/17 1242  . atorvastatin (LIPITOR) tablet 40 mg  40 mg Oral QPM Hessie Knows, MD   40 mg  at 11/19/17 1729  . bisacodyl (DULCOLAX) suppository 10 mg  10 mg Rectal Daily PRN Hessie Knows, MD      . calcium-vitamin D (OSCAL WITH D) 500-200 MG-UNIT per tablet 1 tablet  1 tablet Oral BID Hessie Knows, MD      . cefTRIAXone (ROCEPHIN) 2 g in sodium chloride 0.9 % 100 mL IVPB  2 g Intravenous Q24H Vira Blanco, [facility]   Stopped at 11/19/17 1648  . celecoxib (CELEBREX) capsule 200 mg  200 mg Oral Daily PRN Hessie Knows, MD   200 mg at  11/19/17 1236  . diphenhydrAMINE (BENADRYL) 12.5 MG/5ML elixir 12.5-25 mg  12.5-25 mg Oral Q4H PRN Hessie Knows, MD      . docusate sodium (COLACE) capsule 100 mg  100 mg Oral BID Hessie Knows, MD   100 mg at 11/19/17 1236  . [START ON 11/20/2017] enoxaparin (LOVENOX) injection 30 mg  30 mg Subcutaneous Q12H Hessie Knows, MD      . fluticasone [facility]) 50 MCG/ACT nasal spray 2 spray  2 spray Each Nare Daily PRN Hessie Knows, MD      . furosemide (LASIX) tablet 20 mg  20 mg Oral Daily Hessie Knows, MD   20 mg at 11/19/17 1233  . gabapentin (NEURONTIN) capsule 300 mg  300 mg Oral QPM Hessie Knows, MD   300 mg at 11/19/17 1729  . HYDROmorphone (DILAUDID) injection 0.5-1 mg  0.5-1 mg Intravenous Q4H PRN Duanne Guess, PA-C   1 mg at 11/19/17 1555  . imipramine (TOFRANIL) tablet 25 mg  25 mg Oral QPM Hessie Knows, MD   25 mg at 11/19/17 1729  . insulin aspart (novoLOG) injection 0-15 Units  0-15 Units Subcutaneous TID WC Hessie Knows, MD      . ketoconazole (NIZORAL) 2 % cream 1 application  1 application Topical BID PRN Hessie Knows, MD      . latanoprost (XALATAN) 0.005 % ophthalmic solution 1 drop  1 drop Both Eyes QHS Hessie Knows, MD      . magnesium citrate solution 1 Bottle  1 Bottle Oral Once PRN Hessie Knows, MD      . magnesium oxide (MAG-OX) tablet 400 mg  400 mg Oral Daily Hessie Knows, MD   400 mg at 11/19/17 1236  . menthol-cetylpyridinium (CEPACOL) lozenge 3 mg  1 lozenge Oral PRN Hessie Knows, MD       Or  . phenol (CHLORASEPTIC) mouth spray 1 spray  1 spray Mouth/Throat PRN Hessie Knows, MD      . methocarbamol (ROBAXIN) tablet 500 mg  500 mg Oral Q6H PRN Hessie Knows, MD   500 mg at 11/19/17 1236   Or  . methocarbamol (ROBAXIN) 500 mg in dextrose 5 % 50 mL IVPB  500 mg Intravenous Q6H PRN Hessie Knows, MD      . metoCLOPramide (REGLAN) tablet 5-10 mg  5-10 mg Oral Q8H PRN Hessie Knows, MD       Or  . metoCLOPramide (REGLAN) injection 5-10 mg  5-10 mg Intravenous  Q8H PRN Hessie Knows, MD      . metoprolol succinate (TOPROL-XL) 24 hr tablet 100 mg  100 mg Oral QPM Hessie Knows, MD   100 mg at 11/19/17 1730  . ondansetron (ZOFRAN) tablet 4 mg  4 mg Oral Q6H PRN Hessie Knows, MD       Or  . ondansetron [facility]) injection 4 mg  4 mg Intravenous Q6H PRN Hessie Knows, MD      . oxyCODONE (  Oxy IR/ROXICODONE) immediate release tablet 5-10 mg  5-10 mg Oral Q3H PRN Duanne Guess, PA-C   10 mg at 11/19/17 1836  . potassium chloride (K-DUR,KLOR-CON) CR tablet 10 mEq  10 mEq Oral Daily Hessie Knows, MD   10 mEq at 11/19/17 1233  . Semaglutide SOPN 0.5 mg  0.5 mg Subcutaneous Weekly Hessie Knows, MD      . senna-docusate (Senokot-S) tablet 1 tablet  1 tablet Oral QHS PRN Hessie Knows, MD      . tranexamic acid (CYKLOKAPRON) 1,000 mg in sodium chloride 0.9 % 100 mL IVPB  1,000 mg Intravenous Once Hessie Knows, MD      . traZODone (DESYREL) tablet 50 mg  50 mg Oral QHS Hessie Knows, MD      . vancomycin (VANCOCIN) 1,250 mg in sodium chloride 0.9 % 250 mL IVPB  1,250 mg Intravenous Q18H Vira Blanco, RPH      . zolpidem (AMBIEN) tablet 5 mg  5 mg Oral QHS PRN Hessie Knows, MD         Discharge Medications: Please see discharge summary for a list of discharge medications.  Relevant Imaging Results:  Relevant Lab Results:   Additional Information (SSN: [id number])  Becky Trevino, Becky Beets, LCSW

## 2017-11-19 NOTE — Evaluation (Signed)
Physical Therapy Evaluation Patient Details Name: Becky Trevino MRN: [id number] DOB: Jun 29, 1946 Today's Date: 11/19/2017   History of Present Illness  Pt is a 72 y.o. female presenting to hospital with R LE swelling and redness.  Pt s/p R knee polyexchange with I&D 11/19/17.  Pt seen for PT eval POD #0.  PMH includes B TKR (R performed 03/11/16), L anterior THR, DM, htn, OA, back surgery, CPAP.  Clinical Impression  Prior to hospital admission, pt was independent.  Pt lives with her husband in 1 level home (level entry).  Currently pt is min assist with bed mobility and requiring 2 assist with standing;  pt unsteady taking steps with RW so assisted pt back to bed for safety (pt appearing drowsy d/t medication; nursing notified).  Pt would benefit from skilled PT to address noted impairments and functional limitations (see below for any additional details).  Upon hospital discharge, recommend pt discharge to home with HHPT and 24/7 assist.    Follow Up Recommendations Home health PT;Supervision/Assistance - 24 hour    Equipment Recommendations  Rolling walker with 5" wheels;3in1 (PT)    Recommendations for Other Services       Precautions / Restrictions Precautions Precautions: Knee Precaution Booklet Issued: Yes (comment) Restrictions Weight Bearing Restrictions: Yes RLE Weight Bearing: Weight bearing as tolerated      Mobility  Bed Mobility Overal bed mobility: Needs Assistance Bed Mobility: Supine to Sit;Sit to Supine     Supine to sit: Min assist;HOB elevated Sit to supine: Min assist;HOB elevated   General bed mobility comments: assist for R LE supine to/from sit; increased effort and time to perform  Transfers Overall transfer level: Needs assistance Equipment used: Rolling walker (2 wheeled) Transfers: Sit to/from Stand Sit to Stand: Min assist;Mod assist;+2 physical assistance         General transfer comment: pt unable to stand with 1 assist (x2 trials) but able  to stand on 3rd attempt with 2 assist; vc's for LE and UE positioning required  Ambulation/Gait Ambulation/Gait assistance: Min assist;Mod assist;+2 safety/equipment Ambulation Distance (Feet): (couple steps forwards and backwards) Assistive device: Rolling walker (2 wheeled)   Gait velocity: decreased   General Gait Details: decreased stance time R LE; vc's to increase UE support through RW; pt unsteady (appearing drowsy d/t medication; nursing notified) so pt assisted back to bed for safety  Stairs            Wheelchair Mobility    Modified Rankin (Stroke Patients Only)       Balance Overall balance assessment: Needs assistance Sitting-balance support: No upper extremity supported;Feet supported Sitting balance-Leahy Scale: Good Sitting balance - Comments: steady sitting reaching within BOS   Standing balance support: Bilateral upper extremity supported Standing balance-Leahy Scale: Poor Standing balance comment: requires B UE support for standing balance                             Pertinent Vitals/Pain Pain Assessment: 0-10 Pain Score: 8 (10/10 beginning of session; 8/10 end of session) Pain Location: R knee Pain Descriptors / Indicators: Aching;Tender;Sore;Guarding;Grimacing Pain Intervention(s): Limited activity within patient's tolerance;Monitored during session;Premedicated before session;Repositioned  Vitals (HR and O2 on room air) stable and WFL throughout treatment session.    Home Living Family/patient expects to be discharged to:: Private residence Living Arrangements: Spouse/significant other Available Help at Discharge: Family Type of Home: House Home Access: Level entry     [place]: One level Home  Equipment: Gilford Rile - 2 wheels;Cane - single point;Shower seat      Prior Function Level of Independence: Independent         Comments: Pt occasionally uses SPC.  No falls in past 6 months.     Hand Dominance         Extremity/Trunk Assessment   Upper Extremity Assessment Upper Extremity Assessment: Overall WFL for tasks assessed    Lower Extremity Assessment Lower Extremity Assessment: RLE deficits/detail(L LE WFL) RLE Deficits / Details: hip flexion at least 2+/5; knee extension at least 2+/5; DF at least 3/5 RLE: Unable to fully assess due to pain    Cervical / Trunk Assessment Cervical / Trunk Assessment: Normal  Communication   Communication: No difficulties  Cognition Arousal/Alertness: Suspect due to medications(Drowsy) Behavior During Therapy: WFL for tasks assessed/performed Overall Cognitive Status: Within Functional Limits for tasks assessed                                        General Comments General comments (skin integrity, edema, etc.): R knee wound vac in place; foley catheter in place.  Nursing cleared pt for participation in physical therapy.  Pt agreeable to PT session.    Exercises Total Joint Exercises Ankle Circles/Pumps: AROM;Strengthening;Both;10 reps;Supine Quad Sets: AROM;Strengthening;Both;10 reps;Supine Short Arc Quad: AAROM;Strengthening;Right;10 reps;Supine Heel Slides: AAROM;Strengthening;Right;10 reps;Supine Hip ABduction/ADduction: AAROM;Strengthening;Right;10 reps;Supine Straight Leg Raises: AAROM;Strengthening;Right;10 reps;Supine Goniometric ROM: R knee extension 12 degrees short of neutral and R knee flexion 83 degrees (semi-supine in bed)   Assessment/Plan    PT Assessment Patient needs continued PT services  PT Problem List Decreased strength;Decreased range of motion;Decreased activity tolerance;Decreased balance;Decreased mobility;Decreased knowledge of precautions;Pain       PT Treatment Interventions DME instruction;Gait training;Functional mobility training;Therapeutic activities;Therapeutic exercise;Balance training;Patient/family education    PT Goals (Current goals can be found in the Care Plan section)  Acute Rehab PT  Goals Patient Stated Goal: to go home PT Goal Formulation: With patient/family Time For Goal Achievement: 12/03/17 Potential to Achieve Goals: Good    Frequency BID   Barriers to discharge        Co-evaluation               AM-PAC PT "6 Clicks" Daily Activity  Outcome Measure Difficulty turning over in bed (including adjusting bedclothes, sheets and blankets)?: A Little Difficulty moving from lying on back to sitting on the side of the bed? : Unable Difficulty sitting down on and standing up from a chair with arms (e.g., wheelchair, bedside commode, etc,.)?: Unable Help needed moving to and from a bed to chair (including a wheelchair)?: Total Help needed walking in hospital room?: Total Help needed climbing 3-5 steps with a railing? : Total 6 Click Score: 8    End of Session Equipment Utilized During Treatment: Gait belt Activity Tolerance: Patient limited by pain;Other (comment)(Limited d/t drowsiness) Patient left: in bed;with call bell/phone within reach;with bed alarm set;with family/visitor present;with SCD's reapplied(B heels elevated via towel rolls; nursing notified no polar care present) Nurse Communication: Mobility status;Precautions;Weight bearing status;Other (comment)(Pt's pain status) PT Visit Diagnosis: Other abnormalities of gait and mobility (R26.89);Muscle weakness (generalized) (M62.81);Difficulty in walking, not elsewhere classified (R26.2);Pain Pain - Right/Left: Right Pain - part of body: Knee    Time: 1610-9604 PT Time Calculation (min) (ACUTE ONLY): 37 min   Charges:   PT Evaluation $PT Eval Low Complexity: 1 Low PT Treatments $Therapeutic  Exercise: 8-22 mins $Therapeutic Activity: 8-22 mins   PT G CodesLeitha Bleak, PT 11/19/17, 5:37 PM [phone]

## 2017-11-19 NOTE — Progress Notes (Signed)
Pharmacy Antibiotic Note  Becky Trevino is a 72 y.o. female admitted on 11/19/2017 with prosthetic joint infection.  Pharmacy has been consulted for Ceftriaxone and Vancomycin dosing.  Ke=0.049 T1/2=14hr Vd=61.5 L  Plan: Will order Vancomycin 1250mg  IV once followed in 8 hours with 1250mg  IV q18h for stacked dosing. Will check a trough level prior to 6th dose. Will need to watch for accumulation in this patient.  Will order Ceftriaxone 2g IV q24h  Height: 5\' 11"  (180.3 cm) Weight: 250 lb (113.4 kg) IBW/kg (Calculated) : 70.8  Temp (24hrs), Avg:97.9 F (36.6 C), Min:97.3 F (36.3 C), Max:99.2 F (37.3 C)  Recent Labs  Lab 11/17/17 1510 11/19/17 1227  WBC 9.6 7.4  CREATININE 1.30* 1.10*    Estimated Creatinine Clearance: 64.1 mL/min (A) (by C-G formula based on SCr of 1.1 mg/dL (H)).    Allergies  Allergen Reactions  . Lisinopril Swelling    Antimicrobials this admission: Vancomycin 3/7 >>  Ceftriaxone 3/7 >>   Thank you for allowing pharmacy to be a part of this patient's care.  Paulina Fusi, PharmD, BCPS 11/19/2017 1:22 PM

## 2017-11-20 ENCOUNTER — Inpatient Hospital Stay: Payer: Self-pay

## 2017-11-20 ENCOUNTER — Encounter: Payer: Self-pay | Admitting: Orthopedic Surgery

## 2017-11-20 LAB — BASIC METABOLIC PANEL WITH GFR
Anion gap: 8 (ref 5–15)
BUN: 20 mg/dL (ref 6–20)
CO2: 24 mmol/L (ref 22–32)
Calcium: 8.5 mg/dL — ABNORMAL LOW (ref 8.9–10.3)
Chloride: 104 mmol/L (ref 101–111)
Creatinine, Ser: 1.73 mg/dL — ABNORMAL HIGH (ref 0.44–1.00)
GFR calc Af Amer: 33 mL/min — ABNORMAL LOW
GFR calc non Af Amer: 28 mL/min — ABNORMAL LOW
Glucose, Bld: 123 mg/dL — ABNORMAL HIGH (ref 65–99)
Potassium: 4.6 mmol/L (ref 3.5–5.1)
Sodium: 136 mmol/L (ref 135–145)

## 2017-11-20 LAB — CBC
HCT: 29.4 % — ABNORMAL LOW (ref 35.0–47.0)
Hemoglobin: 10.3 g/dL — ABNORMAL LOW (ref 12.0–16.0)
MCH: 33 pg (ref 26.0–34.0)
MCHC: 35 g/dL (ref 32.0–36.0)
MCV: 94.5 fL (ref 80.0–100.0)
Platelets: 262 K/uL (ref 150–440)
RBC: 3.11 MIL/uL — ABNORMAL LOW (ref 3.80–5.20)
RDW: 14.8 % — ABNORMAL HIGH (ref 11.5–14.5)
WBC: 8.5 K/uL (ref 3.6–11.0)

## 2017-11-20 LAB — GLUCOSE, CAPILLARY
GLUCOSE-CAPILLARY: 101 mg/dL — AB (ref 65–99)
Glucose-Capillary: 93 mg/dL (ref 65–99)
Glucose-Capillary: 102 mg/dL — ABNORMAL HIGH (ref 65–99)
Glucose-Capillary: 130 mg/dL — ABNORMAL HIGH (ref 65–99)

## 2017-11-20 MED ORDER — SODIUM CHLORIDE 0.9% FLUSH
10.0000 mL | Freq: Two times a day (BID) | INTRAVENOUS | Status: DC
  Administered 2017-11-21 – 2017-11-22 (×3): 10 mL

## 2017-11-20 MED ORDER — FE FUMARATE-B12-VIT C-FA-IFC PO CAPS
1.0000 | ORAL_CAPSULE | Freq: Two times a day (BID) | ORAL | Status: DC
  Administered 2017-11-20 – 2017-11-22 (×5): 1 via ORAL
  Filled 2017-11-20 (×5): qty 1

## 2017-11-20 MED ORDER — SODIUM CHLORIDE 0.9% FLUSH
10.0000 mL | INTRAVENOUS | Status: DC | PRN
  Administered 2017-11-21: 10 mL
  Filled 2017-11-20: qty 40

## 2017-11-20 MED ORDER — SODIUM CHLORIDE 0.9 % IV SOLN
1250.0000 mg | INTRAVENOUS | Status: DC
  Administered 2017-11-20: 1250 mg via INTRAVENOUS
  Filled 2017-11-20 (×2): qty 1250

## 2017-11-20 NOTE — Progress Notes (Signed)
Physical Therapy Treatment Patient Details Name: Becky Trevino MRN: [id number] DOB: 1946-03-16 Today's Date: 11/20/2017    History of Present Illness Pt is a 72 y.o. female presenting to hospital with R LE swelling and redness.  Pt s/p R knee polyexchange with I&D 11/19/17.  Pt seen for PT eval POD #0.  PMH includes B TKR (R performed 03/11/16), L anterior THR, DM, htn, OA, back surgery, CPAP.    PT Comments    Pt ambulated 90 ft w/ RW and CGA demonstrating good balance and safe technique with RW (see mobility for details).  Pt reported a decrease in R knee pain 8/10 (beginning of session) to 6/10 pain (during ambulation). Pt would benefit from continued PT to progress R knee AROM (-1 to 95, taken am session), strengthening, and activity tolerance.    Follow Up Recommendations  Home health PT;Supervision/Assistance - 24 hour     Equipment Recommendations  Rolling walker with 5" wheels    Recommendations for Other Services       Precautions / Restrictions Precautions Precautions: Knee;Fall Precaution Booklet Issued: Yes (comment) Restrictions Weight Bearing Restrictions: Yes RLE Weight Bearing: Weight bearing as tolerated    Mobility  Bed Mobility Overal bed mobility: Modified Independent Bed Mobility: Sit to Supine       Sit to supine: Min guard   General bed mobility comments: Pt demonstrated good balance lifting her R leg up onto the bed. Pt required extra time and effort to perform.  Transfers Overall transfer level: Needs assistance Equipment used: Rolling walker (2 wheeled) Transfers: Sit to/from Stand Sit to Stand: Min assist         General transfer comment: Pt demonstrated good balance and technique of pushing from B arm rests on chair for sit to stand.  Ambulation/Gait Ambulation/Gait assistance: Min guard Ambulation Distance (Feet): 90 Feet Assistive device: Rolling walker (2 wheeled) Gait Pattern/deviations: Step-to pattern;Step-through  pattern;Decreased stride length;Decreased stance time - right Gait velocity: decreased   General Gait Details: Pt demonstrated good balance w/ RW and CGA with step to pattern during ambulation. Pt trialed step through pattern for ~ 10 ft and was limited by pain in R knee. Vc's given to keep L foot within walker for safety, stepping technique for turns, and to contract LLE for stability suring swing of RLE.   Stairs            Wheelchair Mobility    Modified Rankin (Stroke Patients Only)       Balance Overall balance assessment: Needs assistance Sitting-balance support: No upper extremity supported;Feet supported Sitting balance-Leahy Scale: Good Sitting balance - Comments: Pt demonstrated good balance when managing clothing at EOB.     Standing balance-Leahy Scale: Poor Standing balance comment: Pt requires BUE support when standing for balance.                            Cognition Arousal/Alertness: Awake/alert Behavior During Therapy: WFL for tasks assessed/performed Overall Cognitive Status: Within Functional Limits for tasks assessed                                 General Comments: Pt A&O x 4      Exercises Total Joint Exercises Goniometric ROM: R knee extension 1 degree short of neutral and R knee AROM flexion 95 degrees sitting at edge of chair (taken am session).    General Comments General  comments (skin integrity, edema, etc.): R knee wound vac in place. Pt agreeable to session.     Pertinent Vitals/Pain Pain Assessment: 0-10 Pain Score: 6  Pain Location: R knee(end of session) Pain Descriptors / Indicators: Aching;Tender;Sore;Guarding Pain Intervention(s): Limited activity within patient's tolerance;Monitored during session;Premedicated before session;Repositioned    Home Living                      Prior Function            PT Goals (current goals can now be found in the care plan section) Acute Rehab PT  Goals Patient Stated Goal: to go home PT Goal Formulation: With patient/family Time For Goal Achievement: 12/03/17 Potential to Achieve Goals: Good Progress towards PT goals: Progressing toward goals    Frequency    BID      PT Plan Current plan remains appropriate    Co-evaluation              AM-PAC PT "6 Clicks" Daily Activity  Outcome Measure  Difficulty turning over in bed (including adjusting bedclothes, sheets and blankets)?: A Little Difficulty moving from lying on back to sitting on the side of the bed? : A Little Difficulty sitting down on and standing up from a chair with arms (e.g., wheelchair, bedside commode, etc,.)?: Unable Help needed moving to and from a bed to chair (including a wheelchair)?: A Little Help needed walking in hospital room?: A Little Help needed climbing 3-5 steps with a railing? : A Lot 6 Click Score: 15    End of Session Equipment Utilized During Treatment: Gait belt Activity Tolerance: Patient limited by fatigue;Other (comment) Patient left: in bed;with call bell/phone within reach;with bed alarm set;with SCD's reapplied;with family/visitor present(B heels elevated by towel rolls.) Nurse Communication: Mobility status PT Visit Diagnosis: Other abnormalities of gait and mobility (R26.89);Muscle weakness (generalized) (M62.81);Difficulty in walking, not elsewhere classified (R26.2);Pain Pain - Right/Left: Right Pain - part of body: Knee     Time: 1445-1510 PT Time Calculation (min) (ACUTE ONLY): 25 min  Charges:                       G Codes:       Erwin Nishiyama Mondrian-Pardue , SPT 11/20/2017, 3:51 PM

## 2017-11-20 NOTE — Progress Notes (Signed)
Pharmacy Antibiotic Note  Becky Trevino is a 72 y.o. female admitted on 11/19/2017 with prosthetic joint infection.  Pharmacy has been consulted for Ceftriaxone and Vancomycin dosing.  3/8 Patient with a bump in SCr this morning. Patient currently ordered Ceftriaxone 2g IV q24h and Vancomycin 1250mg  IV q18h.  Plan: Will continue with Ceftriaxone 2g IV q24h  Will decrease to Vancomycin 1250mg  IV q24h.  Height: 5\' 11"  (180.3 cm) Weight: 250 lb (113.4 kg) IBW/kg (Calculated) : 70.8  Temp (24hrs), Avg:98.7 F (37.1 C), Min:97.3 F (36.3 C), Max:100.2 F (37.9 C)  Recent Labs  Lab 11/17/17 1510 11/19/17 1227 11/20/17 0543  WBC 9.6 7.4 8.5  CREATININE 1.30* 1.10* 1.73*    Estimated Creatinine Clearance: 40.7 mL/min (A) (by C-G formula based on SCr of 1.73 mg/dL (H)).    Allergies  Allergen Reactions  . Lisinopril Swelling    Antimicrobials this admission: Vancomycin 3/7 >>  Ceftriaxone 3/7 >>   Dose adjustments this admission: 3/8 decrease Vancomycin to 1250mg  IV q24h due to increase in SCr.  Microbiology results: 3/7 BCx: NGTD 3/7 WCx: NGTD   Thank you for allowing pharmacy to be a part of this patient's care.  Paulina Fusi, PharmD, BCPS 11/20/2017 8:55 AM

## 2017-11-20 NOTE — Anesthesia Postprocedure Evaluation (Signed)
Anesthesia Post Note  Patient: Becky Trevino  Procedure(s) Performed: RIGHT KNEE POLY EXCHANGE WITH IRRIGATION AND DEBRIDEMENT (Right Knee)  Patient location during evaluation: Nursing Unit Anesthesia Type: Spinal Level of consciousness: awake, awake and alert and oriented Pain management: pain level controlled Vital Signs Assessment: post-procedure vital signs reviewed and stable Respiratory status: spontaneous breathing Cardiovascular status: blood pressure returned to baseline and stable Postop Assessment: no headache and no backache Anesthetic complications: no     Last Vitals:  Vitals:   11/20/17 0428 11/20/17 0732  BP: 107/62 (!) 99/48  Pulse: (!) 103 99  Resp: 19 20  Temp: 37.9 C 37.8 C  SpO2: 96% 92%    Last Pain:  Vitals:   11/20/17 0732  TempSrc: Oral  PainSc:                  Johnna Acosta

## 2017-11-20 NOTE — Progress Notes (Signed)
Peripherally Inserted Central Catheter/Midline Placement  The IV Nurse has discussed with the patient and/or persons authorized to consent for the patient, the purpose of this procedure and the potential benefits and risks involved with this procedure.  The benefits include less needle sticks, lab draws from the catheter, and the patient may be discharged home with the catheter. Risks include, but not limited to, infection, bleeding, blood clot (thrombus formation), and puncture of an artery; nerve damage and irregular heartbeat and possibility to perform a PICC exchange if needed/ordered by physician.  Alternatives to this procedure were also discussed.  Bard Power PICC patient education guide, fact sheet on infection prevention and patient information card has been provided to patient /or left at bedside.    PICC/Midline Placement Documentation  PICC Single Lumen 11/20/17 PICC Right Brachial 41 cm 0 cm (Active)  Indication for Insertion or Continuance of Line Home intravenous therapies (PICC only) 11/20/2017  6:00 PM  Exposed Catheter (cm) 0 cm 11/20/2017  6:00 PM  Site Assessment Clean;Dry;Intact 11/20/2017  6:00 PM  Line Status Flushed;Saline locked;Blood return noted 11/20/2017  6:00 PM  Dressing Type Transparent;Securing device 11/20/2017  6:00 PM  Dressing Status Clean;Dry;Intact;Antimicrobial disc in place 11/20/2017  6:00 PM  Dressing Change Due 11/27/17 11/20/2017  6:00 PM       Becky Trevino 11/20/2017, 6:30 PM

## 2017-11-20 NOTE — Progress Notes (Signed)
   Subjective: 1 Day Post-Op Procedure(s) (LRB): RIGHT KNEE POLY EXCHANGE WITH IRRIGATION AND DEBRIDEMENT (Right) Patient reports pain as 8 on 0-10 scale.   Patient is well, and has had no acute complaints or problems Denies any CP, SOB, ABD pain. We will continue therapy today.    Objective: Vital signs in last 24 hours: Temp:  [97.3 F (36.3 C)-100.2 F (37.9 C)] 100.1 F (37.8 C) (03/08 0732) Pulse Rate:  [77-103] 99 (03/08 0732) Resp:  [13-20] 20 (03/08 0732) BP: (97-129)/(48-87) 99/48 (03/08 0732) SpO2:  [92 %-100 %] 92 % (03/08 0732)  Intake/Output from previous day: 03/07 0701 - 03/08 0700 In: 1946.7 [I.V.:1846.7; IV Piggyback:100] Out: 1800 [Urine:1600; Blood:200] Intake/Output this shift: No intake/output data recorded.  Recent Labs    11/17/17 1510 11/19/17 1227 11/20/17 0543  HGB 12.3 11.3* 10.3*   Recent Labs    11/19/17 1227 11/20/17 0543  WBC 7.4 8.5  RBC 3.65* 3.11*  HCT 34.6* 29.4*  PLT 306 262   Recent Labs    11/17/17 1510 11/19/17 1227 11/20/17 0543  NA 136  --  136  K 4.3  --  4.6  CL 99*  --  104  CO2 25  --  24  BUN 22*  --  20  CREATININE 1.30* 1.10* 1.73*  GLUCOSE 87  --  123*  CALCIUM 9.1  --  8.5*   Recent Labs    11/17/17 1510  INR 1.13    EXAM General - Patient is Alert, Appropriate and Oriented Extremity - Neurovascular intact Sensation intact distally Intact pulses distally Dorsiflexion/Plantar flexion intact No cellulitis present Compartment soft Dressing - dressing C/D/I and no drainage, wound VAC is intact with no drainage. Motor Function - intact, moving foot and toes well on exam.   Past Medical History:  Diagnosis Date  . Cervical spondylosis   . Chronic kidney disease    Stage 3  . DDD (degenerative disc disease), cervical    [facility]  . Diabetes mellitus without complication ([place])   . Diverticulosis   . Hyperlipidemia   . Hypertension   . Intertrigo   . Leg cramps   . Leg varices    . Obesity   . OSA (obstructive sleep apnea)   . Symptomatic menopausal or female climacteric states     Assessment/Plan:   1 Day Post-Op Procedure(s) (LRB): RIGHT KNEE POLY EXCHANGE WITH IRRIGATION AND DEBRIDEMENT (Right) Active Problems:   Infection of total right knee replacement (HCC)   Acute post op blood loss anemia    Acute renal insufficiency  Estimated body mass index is 34.87 kg/m as calculated from the following:   Height as of this encounter: 5\' 11"  (1.803 m).   Weight as of this encounter: 113.4 kg (250 lb). Advance diet Up with therapy  Needs BM Encouraged incentive spirometer. Acute post op blood loss anemia -hemoglobin 10.3.  Start iron, recheck hemoglobin in the morning Acute renal insufficiency -creatinine 1.73 baseline between 1.10 and 1.3.  Continue with IV fluids and recheck labs in the morning. Monitor BP Infectious disease following.  Cultures are pending.  PICC line to be placed today and IV antibiotic orders have been placed.  Patient will need IV antibiotics for 6 weeks.   DVT Prophylaxis - Lovenox, Foot Pumps and TED hose Weight-Bearing as tolerated to right leg   T. Rachelle Hora, PA-C [place] 11/20/2017, 7:59 AM

## 2017-11-20 NOTE — Progress Notes (Signed)
Physical Therapy Treatment Patient Details Name: ROBIE OATS MRN: [id number] DOB: [birth year] Today's Date: 11/20/2017    History of Present Illness Pt is a 72 y.o. female presenting to hospital with R LE swelling and redness.  Pt s/p R knee polyexchange with I&D 11/19/17.  Pt seen for PT eval POD #0.  PMH includes B TKR (R performed 03/11/16), L anterior THR, DM, htn, OA, back surgery, CPAP.    PT Comments    Pt ambulated 15 ft w/ RW and CGA while demonstrating safe use of RW. Pt required min assist with sit to stand transfer from chair. Pt reported a decrease in R knee pain from 10/10 (beginning of session) to 8/10 (end of session), and demonstrated R knee AROM -1 to 95 degrees. PT will focus on progressing ambulation, R knee AROM, RLE strength, and activity tolerance at next session.   Follow Up Recommendations  Home health PT;Supervision/Assistance - 24 hour     Equipment Recommendations  Rolling walker with 5" wheels    Recommendations for Other Services       Precautions / Restrictions Precautions Precautions: Knee;Fall Precaution Booklet Issued: Yes (comment) Restrictions Weight Bearing Restrictions: Yes RLE Weight Bearing: Weight bearing as tolerated    Mobility  Bed Mobility                  Transfers Overall transfer level: Needs assistance Equipment used: Rolling walker (2 wheeled) Transfers: Sit to/from Stand;Stand Pivot Transfers Sit to Stand: Min assist Stand pivot transfers: Min assist       General transfer comment: Pt demonstrated good balance and safe use of RW w/ min assist from sit to stand.  Ambulation/Gait Ambulation/Gait assistance: Min guard Ambulation Distance (Feet): 15 Feet Assistive device: Rolling walker (2 wheeled) Gait Pattern/deviations: Step-to pattern Gait velocity: decreased   General Gait Details: Pt demonstrated good balance w/ RW and CGA ambulating 15 ft inside room.  Vc's were given to push through BUE to offweight RLE  during stance, Pt demonstrated understanding.   Stairs            Wheelchair Mobility    Modified Rankin (Stroke Patients Only)       Balance Overall balance assessment: Needs assistance Sitting-balance support: No upper extremity supported;Feet supported Sitting balance-Leahy Scale: Good Sitting balance - Comments: Pt demonstrated good balance scooting and sitting at edge of chair prior to ambulation.   Standing balance support: Bilateral upper extremity supported Standing balance-Leahy Scale: Poor Standing balance comment: Pt requires BUE support in order to balance while standing.                            Cognition Arousal/Alertness: Awake/alert Behavior During Therapy: WFL for tasks assessed/performed Overall Cognitive Status: Within Functional Limits for tasks assessed                                 General Comments: Pt A&O x 4      Exercises Total Joint Exercises Goniometric ROM: R knee extension 1 degree short of neutral and R knee AROM flexion 95 degrees sitting at edge of chair.    General Comments General comments (skin integrity, edema, etc.): R knee wound vac in place Pt agreeable to session.     Pertinent Vitals/Pain Pain Assessment: 0-10 Pain Score: 8 (post ambulation of 15 ft.) Pain Location: R knee Pain Descriptors / Indicators: Aching;Tender;Sore;Guarding Pain Intervention(s):  Limited activity within patient's tolerance;Monitored during session;Premedicated before session;Repositioned    Home Living                      Prior Function            PT Goals (current goals can now be found in the care plan section) Acute Rehab PT Goals Patient Stated Goal: to go home PT Goal Formulation: With patient/family Time For Goal Achievement: 12/03/17 Potential to Achieve Goals: Good Progress towards PT goals: Progressing toward goals    Frequency    BID      PT Plan Current plan remains appropriate     Co-evaluation              AM-PAC PT "6 Clicks" Daily Activity  Outcome Measure  Difficulty turning over in bed (including adjusting bedclothes, sheets and blankets)?: A Little Difficulty moving from lying on back to sitting on the side of the bed? : A Lot Difficulty sitting down on and standing up from a chair with arms (e.g., wheelchair, bedside commode, etc,.)?: Unable Help needed moving to and from a bed to chair (including a wheelchair)?: A Little Help needed walking in hospital room?: A Little Help needed climbing 3-5 steps with a railing? : A Lot 6 Click Score: 14    End of Session Equipment Utilized During Treatment: Gait belt Activity Tolerance: Patient limited by fatigue;Other (comment)(Pt reported 10/10 at beginning of session, but was able to amulate 13ft and reported decrease in pain 8/10 by end of session.) Patient left: in chair;with call bell/phone within reach;with chair alarm set;with family/visitor present;with SCD's reapplied(Pt has very long legs which extend past recliner when elevated.  A standard chair without arms was utilized to elevate feet.  B heels evelated w/ towel rolls.) Nurse Communication: Mobility status;Other (comment)(RN notifited pain 8/10.) PT Visit Diagnosis: Other abnormalities of gait and mobility (R26.89);Muscle weakness (generalized) (M62.81);Difficulty in walking, not elsewhere classified (R26.2);Pain Pain - Right/Left: Right Pain - part of body: Knee     Time: 1010-1053 PT Time Calculation (min) (ACUTE ONLY): 43 min  Charges:                       G Codes:        Ronesha Heenan Mondrian-Pardue, SPT 11/20/2017, 11:41 AM

## 2017-11-20 NOTE — Progress Notes (Signed)
Infectious Disease Long Term IV Antibiotic Orders Becky Trevino 1945/10/15  Diagnosis:PJI   Culture results Pending  LABS Lab Results  Component Value Date   CREATININE 1.73 (H) 11/20/2017   Lab Results  Component Value Date   WBC 8.5 11/20/2017   HGB 10.3 (L) 11/20/2017   HCT 29.4 (L) 11/20/2017   MCV 94.5 11/20/2017   PLT 262 11/20/2017   Lab Results  Component Value Date   ESRSEDRATE 76 (H) 11/17/2017   No results found for: CRP  Allergies:  Allergies  Allergen Reactions  . Lisinopril Swelling    Discharge antibiotics Vancomycin            1250      mg  every     24          hours .     Goal vancomycin trough 15-20.    Pharmacy to adjust dosing based on levels Ceftriaxone 2 grams every       24        hours  PICC Care per protocol Labs weekly while on IV antibiotics -FAX weekly labs to [phone] CBC w diff   Cr, LFT Vancomycin Trough   CRP, ESR   Planned duration of antibiotics 6 weeks from 3/7  Stop date 12/24/17 Follow up clinic date 3 weeks   Leonel Ramsay, MD

## 2017-11-20 NOTE — Care Management (Signed)
Becky Trevino with [facility] notified that IV antibiotic orders have been entered.

## 2017-11-20 NOTE — Care Management Note (Addendum)
Case Management Note  Patient Details  Name: Becky Trevino MRN: [id number] Date of Birth: [birth year]  Subjective/Objective:  [facility] [place]: [phone]. Called Lovenox 40 mg # 14 no refills.  Catha Gosselin with [facility] as to when antibiotic dose will be due on Sunday. It is anticipated patient will dc tomorrow with next antibiotic dose being due on Sunday                 Action/Plan:   Expected Discharge Date:  11/22/17               Expected Discharge Plan:  [place]  In-House Referral:     Discharge planning Services  CM Consult  Post Acute Care Choice:  Home Health Choice offered to:  Patient  DME Arranged:    DME Agency:     HH Arranged:  RN, PT [place] Agency:  [place]  Status of Service:  In process, will continue to follow  If discussed at Long Length of Stay Meetings, dates discussed:    Additional Comments:  Jolly Mango, RN 11/20/2017, 3:10 PM

## 2017-11-20 NOTE — Progress Notes (Signed)
[place] INFECTIOUS DISEASE PROGRESS NOTE Date of Admission:  11/19/2017     ID: Becky Trevino is a 72 y.o. female with prosthetic joint infection  Active Problems:   Infection of total right knee replacement (HCC)   Subjective: Low grade fever 100.1 cx pending  ROS  Eleven systems are reviewed and negative except per hpi  Medications:  Antibiotics Given (last 72 hours)    Date/Time Action Medication Dose Rate   11/19/17 0817 New Bag/Given   ceFAZolin (ANCEF) 3 g in dextrose 5 % 50 mL IVPB 3 g    11/19/17 1255 New Bag/Given   vancomycin (VANCOCIN) 1,250 mg in sodium chloride 0.9 % 250 mL IVPB 1,250 mg 166.7 mL/hr   11/19/17 1451 New Bag/Given   cefTRIAXone (ROCEPHIN) 2 g in sodium chloride 0.9 % 100 mL IVPB 2 g 200 mL/hr   11/19/17 2118 New Bag/Given   vancomycin (VANCOCIN) 1,250 mg in sodium chloride 0.9 % 250 mL IVPB 1,250 mg 166.7 mL/hr     . aspirin EC  81 mg Oral Daily  . atorvastatin  40 mg Oral QPM  . calcium-vitamin D  1 tablet Oral BID  . docusate sodium  100 mg Oral BID  . enoxaparin (LOVENOX) injection  30 mg Subcutaneous Q12H  . ferrous [id number] C-folic acid  1 capsule Oral BID  . furosemide  20 mg Oral Daily  . gabapentin  300 mg Oral QPM  . imipramine  25 mg Oral QPM  . insulin aspart  0-15 Units Subcutaneous TID WC  . latanoprost  1 drop Both Eyes QHS  . magnesium oxide  400 mg Oral Daily  . metoprolol succinate  100 mg Oral QPM  . potassium chloride  10 mEq Oral Daily  . Semaglutide  0.5 mg Subcutaneous Weekly  . traZODone  50 mg Oral QHS    Objective: Vital signs in last 24 hours: Temp:  [98.5 F (36.9 C)-100.2 F (37.9 C)] 98.9 F (37.2 C) (03/08 1138) Pulse Rate:  [91-103] 92 (03/08 1138) Resp:  [18-20] 18 (03/08 1138) BP: (99-107)/(48-65) 100/58 (03/08 1138) SpO2:  [90 %-96 %] 96 % (03/08 1138)  Constitutional:  oriented to person, place, and time. appears well-developed and well-nourished. No distress.  HENT: /AT,  PERRLA, no scleral icterus Mouth/Throat: Oropharynx is clear and moist. No oropharyngeal exudate.  Cardiovascular: Normal rate, regular rhythm and normal heart sounds. Exam reveals no gallop and no friction rub.  No murmur heard.  Pulmonary/Chest: Effort normal and breath sounds normal. No respiratory distress.  has no wheezes.  Neck = supple, no nuchal rigidity Abdominal: Soft. Bowel sounds are normal.  exhibits no distension. There is no tenderness.  Lymphadenopathy: no cervical adenopathy. No axillary adenopathy Neurological: alert and oriented to person, place, and time.  Ext R knee wrapped post op Skin: Skin is warm and dry. No rash noted. No erythema.  Psychiatric: a normal mood and affect.  behavior is normal.     Lab Results Recent Labs    11/17/17 1510 11/19/17 1227 11/20/17 0543  WBC 9.6 7.4 8.5  HGB 12.3 11.3* 10.3*  HCT 37.3 34.6* 29.4*  NA 136  --  136  K 4.3  --  4.6  CL 99*  --  104  CO2 25  --  24  BUN 22*  --  20  CREATININE 1.30* 1.10* 1.73*    Microbiology: Results for orders placed or performed during the hospital encounter of 11/19/17  Aerobic/Anaerobic Culture (surgical/deep wound)  Status: None (Preliminary result)   Collection Time: 11/19/17  8:11 AM  Result Value Ref Range Status   Specimen Description   Final    TISSUE SYNOVIUM 1 Performed at [facility], [address] [place], [place] [address]    Special Requests   Final    NONE Performed at [facility], [place], [place] [address]    Gram Stain NO WBC SEEN NO ORGANISMS SEEN   Final   Culture   Final    NO GROWTH 1 DAY Performed at [facility], [place] [address], [place], [place] [address]    Report Status PENDING  Incomplete  Aerobic/Anaerobic Culture (surgical/deep wound)     Status: None (Preliminary result)   Collection Time: 11/19/17  8:21 AM  Result Value Ref Range Status   Specimen Description   Final    TISSUE SYNOVIUM  2 Performed at [facility], [address] [place], [place] [address]    Special Requests   Final    NONE Performed at [facility], [place], [place] [address]    Gram Stain NO WBC SEEN NO ORGANISMS SEEN   Final   Culture   Final    NO GROWTH 1 DAY Performed at [facility], [place] [address] [place], [place] [address]    Report Status PENDING  Incomplete  Aerobic/Anaerobic Culture (surgical/deep wound)     Status: None (Preliminary result)   Collection Time: 11/19/17  8:21 AM  Result Value Ref Range Status   Specimen Description   Final    TISSUE SYNOVIUM 3 Performed at [facility], [address], [place], [place] [address]    Special Requests   Final    NONE Performed at [facility], [place] [place], [place] [address]    Gram Stain   Final    RARE WBC PRESENT, PREDOMINANTLY PMN NO ORGANISMS SEEN    Culture   Final    NO GROWTH 1 DAY Performed at [facility], [place] [address], [place], [place] [address]    Report Status PENDING  Incomplete  Aerobic/Anaerobic Culture (surgical/deep wound)     Status: None (Preliminary result)   Collection Time: 11/19/17  8:23 AM  Result Value Ref Range Status   Specimen Description   Final    SYNOVIAL SYNOVIAL FLUID 1 Performed at [facility], [address], [place], [place] [address]    Special Requests   Final    NONE Performed at [facility], [address] [place], [place] [address]    Gram Stain   Final    RARE WBC PRESENT, PREDOMINANTLY PMN NO ORGANISMS SEEN    Culture   Final    NO GROWTH 1 DAY Performed at [facility], [place] [address], [place], [place] [address]    Report Status PENDING  Incomplete  Aerobic/Anaerobic Culture (surgical/deep wound)     Status: None (Preliminary result)   Collection Time: 11/19/17  8:23 AM  Result Value Ref Range Status   Specimen Description   Final    SYNOVIAL SYNOVIAL FLUID  2 Performed at [facility], [address] [place], [place] [address]    Special Requests   Final    NONE Performed at [facility], [place] [place],  [address]    Gram Stain NO WBC SEEN NO ORGANISMS SEEN   Final   Culture   Final  TOO YOUNG TO READ Performed at [facility], [place] [address] [place], [place] [address]    Report Status PENDING  Incomplete  Culture, blood (Routine X 2) w Reflex to ID Panel     Status: None (Preliminary result)   Collection Time: 11/19/17  1:39 PM  Result Value Ref Range Status   Specimen Description BLOOD RIGHT ANTECUBITAL  Final   Special Requests   Final    BOTTLES DRAWN AEROBIC AND ANAEROBIC Blood Culture adequate volume   Culture   Final    NO GROWTH < 24 HOURS Performed at [facility], [address] [place], [place] [address]    Report Status PENDING  Incomplete  Culture, blood (Routine X 2) w Reflex to ID Panel     Status: None (Preliminary result)   Collection Time: 11/19/17  1:47 PM  Result Value Ref Range Status   Specimen Description BLOOD BLOOD RIGHT HAND  Final   Special Requests   Final    BOTTLES DRAWN AEROBIC AND ANAEROBIC Blood Culture results may not be optimal due to an inadequate volume of blood received in culture bottles   Culture   Final    NO GROWTH < 24 HOURS Performed at [facility], [address] [place],  [address]    Report Status PENDING  Incomplete    Studies/Results: No results found.  Assessment/Plan: Becky Trevino is a 72 y.o. female with R Knee Prosthetic joint infection onset over the last 2 weeks with no obvious injury or source of infection. Denies recent procedures, infections or dental work.  ESR 76, aspirate cx done 2/22 and 2/28 negative. MRSA PCR neg. On 3/7 underwent I and D and poly exchange.  Repeat cx TYTR. Plan is for prosthesis retention and then suppression. If fails may need 2 stage revision.    Recommendations Cont vanco and ceftriaxone Place PICC line. WIll plan on 6 weeks IV abx- If cx positive can tailor abx to culture results. See abx order sheet, will need to adjust vanco prior to finalizing.   Thank you very much for the consult. Will follow with you.  Leonel Ramsay   11/20/2017, 1:58 PM

## 2017-11-20 NOTE — Clinical Social Work Note (Addendum)
Clinical Social Work Assessment  Patient Details  Name: Becky Trevino MRN: [id number] Date of Birth: 07/07/1946  Date of referral:  11/20/17               Reason for consult:  Community Resources, Discharge Planning, Other (Comment Required)(Home Health )                Permission sought to share information with:  Case Manager Permission granted to share information::  Yes, Verbal Permission Granted  Name::        Agency::     Relationship::     Contact Information:     Housing/Transportation Living arrangements for the past 2 months:  Apartment Source of Information:  Patient, Spouse Patient Interpreter Needed:  None Criminal Activity/Legal Involvement Pertinent to Current Situation/Hospitalization:  No - Comment as needed Significant Relationships:  Spouse Lives with:  Spouse Do you feel safe going back to the place where you live?  Yes Need for family participation in patient care:  Yes (Comment)  Care giving concerns:  Patient lives in [place] in an apartment with her husband Becky Trevino.   Social Worker assessment / plan:  Clinical Social Worker (CSW) received SNF consult. PT is recommending home health and patient will need a PICC line for IV ABX. CSW met with patient and her husband Becky Trevino was at bedside. CSW introduced self and explained role of CSW department. Patient was alert and oriented X4 and was sitting up in the chair at bedside. Patient reported that she lives in an apartment with her husband in [place]. CSW explained that PT is recommending home health and per PA note patient will need 6 weeks of IV ABX. Patient reported that she feels comfortable going home with a PICC line and stated that her niece is familiar with PICC line care. Patient reported that she does not want to go to SNF and is going home. Patient chose [facility] for home health PT [facility] [place] Patient reported that she has a walker and a shower seat. Patient requested a  bedside commode. Patient's husband reported that he still works and [employer] is patient's primary insurance.   Patient's address is [address], [place], [place] [address].   Plan is for patient to D/C home with [facility]. RN case manager aware of above. [facility] rep is aware of above. Please reconsult if future social work needs arise. CSW signing off.    Employment status:  Retired Insurance information:  Managed Care PT Recommendations:  Home with Home Health Information / Referral to community resources:  Other (Comment Required)(Home Health )  Patient/Family's Response to care:  Patient chose to go home with home health.   Patient/Family's Understanding of and Emotional Response to Diagnosis, Current Treatment, and Prognosis:  Patient was very pleasant and thanked CSW for assistance.   Emotional Assessment Appearance:  Appears stated age Attitude/Demeanor/Rapport:    Affect (typically observed):  Accepting, Adaptable, Pleasant Orientation:  Oriented to Self, Oriented to Place, Oriented to  Time, Oriented to Situation Alcohol / Substance use:  Not Applicable Psych involvement (Current and /or in the community):  No (Comment)  Discharge Needs  Concerns to be addressed:  Discharge Planning Concerns Readmission within the last 30 days:  No Current discharge risk:  Chronically ill Barriers to Discharge:  Continued Medical Work up   Sample, Bailey M, LCSW 11/20/2017, 11:39 AM  

## 2017-11-21 LAB — BASIC METABOLIC PANEL
ANION GAP: 7 (ref 5–15)
BUN: 21 mg/dL — AB (ref 6–20)
CO2: 24 mmol/L (ref 22–32)
Calcium: 8.7 mg/dL — ABNORMAL LOW (ref 8.9–10.3)
Chloride: 106 mmol/L (ref 101–111)
Creatinine, Ser: 1.23 mg/dL — ABNORMAL HIGH (ref 0.44–1.00)
GFR, EST AFRICAN AMERICAN: 50 mL/min — AB (ref >60)
GFR, EST NON AFRICAN AMERICAN: 43 mL/min — AB (ref >60)
Glucose, Bld: 104 mg/dL — ABNORMAL HIGH (ref 65–99)
POTASSIUM: 4.2 mmol/L (ref 3.5–5.1)
SODIUM: 137 mmol/L (ref 135–145)

## 2017-11-21 LAB — CBC
HCT: 29.8 % — ABNORMAL LOW (ref 35.0–47.0)
Hemoglobin: 9.8 g/dL — ABNORMAL LOW (ref 12.0–16.0)
MCH: 31.1 pg (ref 26.0–34.0)
MCHC: 32.8 g/dL (ref 32.0–36.0)
MCV: 94.7 fL (ref 80.0–100.0)
PLATELETS: 253 10*3/uL (ref 150–440)
RBC: 3.15 MIL/uL — AB (ref 3.80–5.20)
RDW: 15 % — ABNORMAL HIGH (ref 11.5–14.5)
WBC: 8.3 10*3/uL (ref 3.6–11.0)

## 2017-11-21 LAB — GLUCOSE, CAPILLARY
Glucose-Capillary: 92 mg/dL (ref 65–99)
Glucose-Capillary: 115 mg/dL — ABNORMAL HIGH (ref 65–99)
Glucose-Capillary: 112 mg/dL — ABNORMAL HIGH (ref 65–99)

## 2017-11-21 LAB — VANCOMYCIN, RANDOM: Vancomycin Rm: 15

## 2017-11-21 MED ORDER — VANCOMYCIN HCL 10 G IV SOLR
1250.0000 mg | INTRAVENOUS | Status: DC
  Administered 2017-11-21: 1250 mg via INTRAVENOUS
  Filled 2017-11-21 (×2): qty 1250

## 2017-11-21 NOTE — Progress Notes (Signed)
PHARMACY CONSULT NOTE FOR:  OUTPATIENT  PARENTERAL ANTIBIOTIC THERAPY (OPAT)  Indication: Prosthetic joint infection  Regimen: Ceftriaxone 2 gram IV q24h and Vancomycin 1250 mg IV q24h End date: 12/24/2017  IV antibiotic discharge orders are pended. To discharging provider:  please sign these orders via discharge navigator,  Select New Orders & click on the button choice - Manage This Unsigned Work.     Thank you for allowing pharmacy to be a part of this patient's care.  Becky Trevino A 11/21/2017, 3:38 PM

## 2017-11-21 NOTE — Progress Notes (Signed)
   Subjective: 2 Days Post-Op Procedure(s) (LRB): RIGHT KNEE POLY EXCHANGE WITH IRRIGATION AND DEBRIDEMENT (Right) Patient reports pain as moderate.   Patient is well, and has had no acute complaints or problems Patient doing well with physical therapy Plan is to go Home after hospital stay. no nausea and no vomiting Patient denies any chest pains or shortness of breath. PICC line was placed yesterday. Has been afebrile for the last 24 hours  Objective: Vital signs in last 24 hours: Temp:  [98.1 F (36.7 C)-98.9 F (37.2 C)] 98.7 F (37.1 C) (03/09 0748) Pulse Rate:  [72-95] 86 (03/09 0748) Resp:  [18-19] 18 (03/09 0748) BP: (91-107)/(54-64) 100/54 (03/09 0748) SpO2:  [90 %-98 %] 92 % (03/09 0748) well approximated incision Heels are non tender and elevated off the bed using rolled towels Intake/Output from previous day: 03/08 0701 - 03/09 0700 In: 2778.3 [P.O.:240; I.V.:2188.3; IV Piggyback:350] Out: 550 [Urine:350; Drains:200] Intake/Output this shift: No intake/output data recorded.  Recent Labs    11/19/17 1227 11/20/17 0543 11/21/17 0609  HGB 11.3* 10.3* 9.8*   Recent Labs    11/20/17 0543 11/21/17 0609  WBC 8.5 8.3  RBC 3.11* 3.15*  HCT 29.4* 29.8*  PLT 262 253   Recent Labs    11/20/17 0543 11/21/17 0609  NA 136 137  K 4.6 4.2  CL 104 106  CO2 24 24  BUN 20 21*  CREATININE 1.73* 1.23*  GLUCOSE 123* 104*  CALCIUM 8.5* 8.7*   No results for input(s): LABPT, INR in the last 72 hours.  EXAM General - Patient is Alert, Appropriate and Oriented Extremity - Neurologically intact Neurovascular intact Sensation intact distally Intact pulses distally Dorsiflexion/Plantar flexion intact No cellulitis present Compartment soft Dressing - Wound VAC in place Motor Function - intact, moving foot and toes well on exam.    Past Medical History:  Diagnosis Date  . Cervical spondylosis   . Chronic kidney disease    Stage 3  . DDD (degenerative disc  disease), cervical    [facility]  . Diabetes mellitus without complication ([place])   . Diverticulosis   . Hyperlipidemia   . Hypertension   . Intertrigo   . Leg cramps   . Leg varices   . Obesity   . OSA (obstructive sleep apnea)   . Symptomatic menopausal or female climacteric states     Assessment/Plan: 2 Days Post-Op Procedure(s) (LRB): RIGHT KNEE POLY EXCHANGE WITH IRRIGATION AND DEBRIDEMENT (Right) Active Problems:   Infection of total right knee replacement (HCC)  Estimated body mass index is 34.87 kg/m as calculated from the following:   Height as of this encounter: 5\' 11"  (1.803 m).   Weight as of this encounter: 113.4 kg (250 lb). Up with therapy Plan for discharge tomorrow Discharge home with home health  Labs: Were reviewed. DVT Prophylaxis - Lovenox, Foot Pumps and TED hose Weight-Bearing as tolerated to right leg    Damaria Stofko R. [place] 11/21/2017, 8:53 AM

## 2017-11-21 NOTE — Progress Notes (Signed)
Physical Therapy Treatment Patient Details Name: Becky Trevino MRN: [id number] DOB: 1946/08/17 Today's Date: 11/21/2017    History of Present Illness Pt is a 72 y.o. female presenting to hospital with R LE swelling and redness.  Pt s/p R knee polyexchange with I&D 11/19/17.  Pt seen for PT eval POD #0.  PMH includes B TKR (R performed 03/11/16), L anterior THR, DM, htn, OA, back surgery, CPAP.    PT Comments    Pt agreeable to PT; reports continued 8/10 pain in R knee/LE. RLE noted to be significantly swollen with ace wraps rolling and causing demarcation; wraps removed and nurse informed. Pt initially found with pillow under RLE/knee; family had placed due to discomfort with towel roll. Pt/family educated on reasoning for towel roll and concerns for pillow especially under knee and to avoid. Pt continues to require Min A for STS from various surfaces. Pt did not wish to ambulate to far, but agreeable to shorter distance than morning session to/from [facility] Ambulates 25 ft. Encouraged ankle pumps and quad sets while in the bed. Pt left in care of nurse and nursing student. Continue PT to progress strength, endurance and safe use of rolling walker for improved functional mobility.   Follow Up Recommendations  Home health PT;Supervision/Assistance - 24 hour     Equipment Recommendations       Recommendations for Other Services       Precautions / Restrictions Precautions Precautions: Knee;Fall Restrictions Weight Bearing Restrictions: Yes RLE Weight Bearing: Weight bearing as tolerated    Mobility  Bed Mobility Overal bed mobility: Modified Independent       Supine to sit: Modified independent (Device/Increase time) Sit to supine: Modified independent (Device/Increase time)   General bed mobility comments: Increased time/effort. Use of trapeze with assist to slide upward in bed  Transfers Overall transfer level: Needs assistance Equipment used: Rolling walker (2  wheeled) Transfers: Sit to/from Stand Sit to Stand: Min assist Stand pivot transfers: Min assist       General transfer comment: From recliner, to/from [facility] to bed  Ambulation/Gait Ambulation/Gait assistance: Min guard Ambulation Distance (Feet): 25 Feet Assistive device: Rolling walker (2 wheeled) Gait Pattern/deviations: Step-to pattern;Decreased weight shift to right(Heavy lean on L side of rw) Gait velocity: decreased Gait velocity interpretation: <1.8 ft/sec, indicative of risk for recurrent falls General Gait Details: Increased lean on L side of rw causing mild tipping multiple times. Cues for equal use of weight bearing on BUEs. Pt states she has sturdier rw at home.    Stairs            Wheelchair Mobility    Modified Rankin (Stroke Patients Only)       Balance Overall balance assessment: Needs assistance Sitting-balance support: Feet supported Sitting balance-Leahy Scale: Good     Standing balance support: Bilateral upper extremity supported Standing balance-Leahy Scale: Fair                              Cognition Arousal/Alertness: Awake/alert Behavior During Therapy: WFL for tasks assessed/performed Overall Cognitive Status: Within Functional Limits for tasks assessed                                        Exercises Total Joint Exercises Ankle Circles/Pumps: AROM;Both;20 reps Quad Sets: Strengthening;Both;20 reps Other Exercises Other Exercises: toilet transfers Other Exercises: family education  to avoid pillow use under RLE Other Exercises: encouraged ankle pumps and QS    General Comments General comments (skin integrity, edema, etc.): RLE with significant edema. Ace wraps removed due to several areas of rolling with demarkation. Nursing informed      Pertinent Vitals/Pain Pain Assessment: 0-10 Pain Score: 8  Pain Location: R knee Pain Intervention(s): Limited activity within patient's tolerance;Monitored  during session;Repositioned    Home Living                      Prior Function            PT Goals (current goals can now be found in the care plan section) Progress towards PT goals: Progressing toward goals    Frequency    BID      PT Plan Current plan remains appropriate    Co-evaluation              AM-PAC PT "6 Clicks" Daily Activity  Outcome Measure  Difficulty turning over in bed (including adjusting bedclothes, sheets and blankets)?: A Little Difficulty moving from lying on back to sitting on the side of the bed? : A Little Difficulty sitting down on and standing up from a chair with arms (e.g., wheelchair, bedside commode, etc,.)?: Unable Help needed moving to and from a bed to chair (including a wheelchair)?: A Little Help needed walking in hospital room?: A Little Help needed climbing 3-5 steps with a railing? : A Lot 6 Click Score: 15    End of Session Equipment Utilized During Treatment: Gait belt Activity Tolerance: Patient tolerated treatment well     PT Visit Diagnosis: Other abnormalities of gait and mobility (R26.89);Muscle weakness (generalized) (M62.81);Difficulty in walking, not elsewhere classified (R26.2);Pain Pain - Right/Left: Right Pain - part of body: Knee     Time: 5366-4403 PT Time Calculation (min) (ACUTE ONLY): 20 min  Charges:  $Gait Training: 8-22 mins $Therapeutic Activity: 8-22 mins                    G CodesLarae Grooms, PTA 11/21/2017, 4:03 PM

## 2017-11-21 NOTE — Progress Notes (Signed)
Physical Therapy Treatment Patient Details Name: Becky Trevino MRN: [id number] DOB: 1945/11/30 Today's Date: 11/21/2017    History of Present Illness Pt is a 72 y.o. female presenting to hospital with R LE swelling and redness.  Pt s/p R knee polyexchange with I&D 11/19/17.  Pt seen for PT eval POD #0.  PMH includes B TKR (R performed 03/11/16), L anterior THR, DM, htn, OA, back surgery, CPAP.    PT Comments    Initial attempt, pt requests return at a later time. Second attempt, pt agreeable to PT. Reports 4/10 pain in R knee with weight bearing. Pt requires Min guard for STS transfers from multiple surfaces/heights with noted decreased use of R knee. Ambulation with heavy lean particularly on L side of rolling walker causing intermittent tipping of rolling walker; encouraged equal weight through UEs for safety/fall prevention. Pt notes she has a sturdier rolling walker at home. Ambulation continues slow with step to pattern. Pt received up in chair and encouraged isometric strengthening. Continue PT to progress strength, endurance to improve functional mobility.    Follow Up Recommendations  Home health PT;Supervision/Assistance - 24 hour     Equipment Recommendations       Recommendations for Other Services       Precautions / Restrictions Precautions Precautions: Knee;Fall Restrictions Weight Bearing Restrictions: Yes RLE Weight Bearing: Weight bearing as tolerated    Mobility  Bed Mobility Overal bed mobility: Modified Independent       Supine to sit: Modified independent (Device/Increase time)     General bed mobility comments: increased time/effort, but able to manage without direct assist  Transfers Overall transfer level: Needs assistance Equipment used: Rolling walker (2 wheeled) Transfers: Sit to/from Stand Sit to Stand: Min assist Stand pivot transfers: Min assist       General transfer comment: Performed from bed, to/from [facility] and to  recliner  Ambulation/Gait Ambulation/Gait assistance: Min guard Ambulation Distance (Feet): 80 Feet Assistive device: Rolling walker (2 wheeled) Gait Pattern/deviations: Step-to pattern;Decreased weight shift to right(Heavy lean on L side of rw) Gait velocity: decreased Gait velocity interpretation: <1.8 ft/sec, indicative of risk for recurrent falls General Gait Details: Increased lean on L side of rw causing mild tipping multiple times. Cues for equal use of weight bearing on BUEs. Pt states she has sturdier rw at home.    Stairs            Wheelchair Mobility    Modified Rankin (Stroke Patients Only)       Balance Overall balance assessment: Needs assistance Sitting-balance support: Feet supported Sitting balance-Leahy Scale: Good     Standing balance support: Bilateral upper extremity supported Standing balance-Leahy Scale: Fair                              Cognition Arousal/Alertness: Awake/alert Behavior During Therapy: WFL for tasks assessed/performed Overall Cognitive Status: Within Functional Limits for tasks assessed                                        Exercises Total Joint Exercises Ankle Circles/Pumps: AROM;Both;20 reps Quad Sets: Strengthening;Both;20 reps Other Exercises Other Exercises: toilet transfers    General Comments        Pertinent Vitals/Pain Pain Assessment: 0-10 Pain Score: 4  Pain Location: R knee Pain Intervention(s): Monitored during session;Repositioned    Home Living  Prior Function            PT Goals (current goals can now be found in the care plan section) Progress towards PT goals: Progressing toward goals    Frequency    BID      PT Plan Current plan remains appropriate    Co-evaluation              AM-PAC PT "6 Clicks" Daily Activity  Outcome Measure  Difficulty turning over in bed (including adjusting bedclothes, sheets and  blankets)?: A Little Difficulty moving from lying on back to sitting on the side of the bed? : A Little Difficulty sitting down on and standing up from a chair with arms (e.g., wheelchair, bedside commode, etc,.)?: Unable Help needed moving to and from a bed to chair (including a wheelchair)?: A Little Help needed walking in hospital room?: A Little Help needed climbing 3-5 steps with a railing? : A Lot 6 Click Score: 15    End of Session Equipment Utilized During Treatment: Gait belt Activity Tolerance: Patient tolerated treatment well     PT Visit Diagnosis: Other abnormalities of gait and mobility (R26.89);Muscle weakness (generalized) (M62.81);Difficulty in walking, not elsewhere classified (R26.2);Pain Pain - Right/Left: Right Pain - part of body: Knee     Time: 3545-6256 PT Time Calculation (min) (ACUTE ONLY): 23 min  Charges:  $Gait Training: 8-22 mins $Therapeutic Activity: 8-22 mins                    G Codes:        Larae Grooms, PTA 11/21/2017, 1:44 PM

## 2017-11-22 DIAGNOSIS — T8453XA Infection and inflammatory reaction due to internal right knee prosthesis, initial encounter: Secondary | ICD-10-CM | POA: Diagnosis not present

## 2017-11-22 DIAGNOSIS — Z791 Long term (current) use of non-steroidal anti-inflammatories (NSAID): Secondary | ICD-10-CM | POA: Diagnosis not present

## 2017-11-22 LAB — CREATININE, SERUM
CREATININE: 1.25 mg/dL — AB (ref 0.44–1.00)
GFR calc Af Amer: 49 mL/min — ABNORMAL LOW (ref >60)
GFR, EST NON AFRICAN AMERICAN: 42 mL/min — AB (ref >60)

## 2017-11-22 LAB — GLUCOSE, CAPILLARY: Glucose-Capillary: 79 mg/dL (ref 65–99)

## 2017-11-22 MED ORDER — OXYCODONE HCL 5 MG PO TABS: 5.0000 mg | ORAL_TABLET | ORAL | 0 refills | Status: DC | PRN

## 2017-11-22 MED ORDER — ENOXAPARIN SODIUM 30 MG/0.3ML ~~LOC~~ SOLN: 30.0000 mg | Freq: Two times a day (BID) | SUBCUTANEOUS | 0 refills | Status: DC

## 2017-11-22 NOTE — Discharge Summary (Signed)
Physician Discharge Summary  Patient ID: Becky Trevino MRN: [id number] DOB/AGE: Sep 03, 1946 72 y.o.  Admit date: 11/19/2017 Discharge date: 11/22/2017  Admission Diagnoses:  SYNOVIAL CYST OF RIGHT POPLITEAL SPACE   Discharge Diagnoses: Patient Active Problem List   Diagnosis Date Noted  . Infection of total right knee replacement ([place]) 11/19/2017  . Type 2 diabetes mellitus with diabetic neuropathy, without long-term current use of insulin ([place]) 11/06/2016  . Primary osteoarthritis of knee 03/11/2016  . Primary osteoarthritis of left hip 12/11/2015  . Benign essential HTN 04/21/2015  . Edema leg 04/21/2015  . Chronic kidney disease (CKD), stage III (moderate) ([place]) 04/21/2015  . Chronic venous insufficiency 04/21/2015  . DDD (degenerative disc disease), cervical 04/21/2015  . Diabetes mellitus with renal manifestation ([place]) 04/21/2015  . Dyslipidemia 04/21/2015  . Bladder cystocele 04/21/2015  . Urinary incontinence 04/21/2015  . Leg varices 04/21/2015  . Insomnia 04/21/2015  . Eczema intertrigo 04/21/2015  . Obesity, morbid ([place]) 04/21/2015  . Obstructive apnea 04/21/2015  . Menopausal and perimenopausal disorder 04/21/2015  . Supraventricular premature beats 04/21/2015  . Osteoarthritis of multiple joints 04/21/2015  . H/O Spinal surgery 11/14/2013  . Detrusor muscle hypertonia 11/07/2013    Past Medical History:  Diagnosis Date  . Cervical spondylosis   . Chronic kidney disease    Stage 3  . DDD (degenerative disc disease), cervical    [facility]  . Diabetes mellitus without complication ([place])   . Diverticulosis   . Hyperlipidemia   . Hypertension   . Intertrigo   . Leg cramps   . Leg varices   . Obesity   . OSA (obstructive sleep apnea)   . Symptomatic menopausal or female climacteric states      Transfusion: No transfusions during this admission   Consultants (if any): Treatment Team:  Leonel Ramsay, MD  Discharged Condition:  Improved  Hospital Course: Becky Trevino is an 72 y.o. female who was admitted 11/19/2017 with a diagnosis of infected right total knee arthroplasty and went to the operating room on 11/19/2017 and underwent the above named procedures.    Surgeries:Procedure(s): RIGHT KNEE POLY EXCHANGE WITH IRRIGATION AND DEBRIDEMENT on 11/19/2017   POST-OPERATIVE DIAGNOSIS: Infected total knee Right  PROCEDURE:  Procedure(s): RIGHT KNEE POLY EXCHANGE WITH IRRIGATION AND DEBRIDEMENT (Right)  SURGEON: Laurene Footman, MD  ASSISTANTS: Rachelle Hora PA-C  ANESTHESIA:   spinal  EBL:  Total I/O In: 1100 [I.V.:1100] Out: -   BLOOD ADMINISTERED:none  DRAINS: none   LOCAL MEDICATIONS USED:  NONE  SPECIMEN:  Source of Specimen:  Culture of synovial fluid x2 and synovial tissue culture x3  DISPOSITION OF SPECIMEN:  PATHOLOGY  COUNTS:  YES  TOURNIQUET:  * Missing tourniquet times found for documented tourniquets in log: 010071 *  IMPLANTS: Medacta TNKase sphere for insert right for 17 mm   Patient tolerated the surgery well. No complications .Patient was taken to PACU where she was stabilized and then transferred to the orthopedic floor.  Patient started on Lovenox 30 mg q 12 hrs. Foot pumps applied bilaterally at 80 mm hgb. Heels elevated off bed with rolled towels. No evidence of DVT. Calves non tender. Negative [place]. Physical therapy started on day #1 for gait training and transfer with OT starting on  day #1 for ADL and assisted devices. Patient has done well with therapy. Ambulated 80 feet upon being discharged.  Patient's IV And Foley were discontinued on day #1 with Hemovac being discontinued on day #2. Dressing was changed  on day 2 prior to patient being discharged   She was given perioperative antibiotics:  Anti-infectives (From admission, onward)   Start     Dose/Rate Route Frequency Ordered Stop   11/21/17 1730  vancomycin (VANCOCIN) 1,250 mg in sodium chloride 0.9 % 250  mL IVPB     1,250 mg 166.7 mL/hr over 90 Minutes Intravenous Every 24 hours 11/21/17 1720     11/20/17 2100  vancomycin (VANCOCIN) 1,250 mg in sodium chloride 0.9 % 250 mL IVPB  Status:  Discontinued     1,250 mg 166.7 mL/hr over 90 Minutes Intravenous Every 24 hours 11/20/17 0851 11/21/17 1720   11/19/17 2100  vancomycin (VANCOCIN) 1,250 mg in sodium chloride 0.9 % 250 mL IVPB  Status:  Discontinued     1,250 mg 166.7 mL/hr over 90 Minutes Intravenous Every 18 hours 11/19/17 1317 11/20/17 0851   11/19/17 1500  cefTRIAXone (ROCEPHIN) 2 g in sodium chloride 0.9 % 100 mL IVPB     2 g 200 mL/hr over 30 Minutes Intravenous Every 24 hours 11/19/17 1234     11/19/17 1300  vancomycin (VANCOCIN) 1,250 mg in sodium chloride 0.9 % 250 mL IVPB     1,250 mg 166.7 mL/hr over 90 Minutes Intravenous  Once 11/19/17 1234 11/19/17 1446   11/19/17 1200  vancomycin (VANCOCIN) IVPB 1000 mg/200 mL premix  Status:  Discontinued     1,000 mg 200 mL/hr over 60 Minutes Intravenous Every 12 hours 11/19/17 1141 11/19/17 1234   11/18/17 2145  ceFAZolin (ANCEF) 3 g in dextrose 5 % 50 mL IVPB     3 g 130 mL/hr over 30 Minutes Intravenous  Once 11/18/17 2135 11/19/17 0827    .  She was fitted with AV 1 compression foot pump devices, instructed on heel pumps, early ambulation, and fitted with TED stockings bilaterally for DVT prophylaxis.  She benefited maximally from the hospital stay and there were no complications.    Recent vital signs:  Vitals:   11/22/17 0314 11/22/17 0711  BP: (!) 105/48 (!) 93/51  Pulse: 85 86  Resp: 18 18  Temp: (!) 97.5 F (36.4 C) 99.1 F (37.3 C)  SpO2: 92% 100%    Recent laboratory studies:  Lab Results  Component Value Date   HGB 9.8 (L) 11/21/2017   HGB 10.3 (L) 11/20/2017   HGB 11.3 (L) 11/19/2017   Lab Results  Component Value Date   WBC 8.3 11/21/2017   PLT 253 11/21/2017   Lab Results  Component Value Date   INR 1.13 11/17/2017   Lab Results  Component  Value Date   NA 137 11/21/2017   K 4.2 11/21/2017   CL 106 11/21/2017   CO2 24 11/21/2017   BUN 21 (H) 11/21/2017   CREATININE 1.25 (H) 11/22/2017   GLUCOSE 104 (H) 11/21/2017    Discharge Medications:   Allergies as of 11/22/2017      Reactions   Lisinopril Swelling      Medication List    STOP taking these medications   aspirin EC 81 MG tablet     TAKE these medications   acetaminophen 650 MG CR tablet Commonly known as:  TYLENOL Take 650-1,300 mg by mouth 2 (two) times daily as needed for pain.   atorvastatin 40 MG tablet Commonly known as:  LIPITOR Take 1 tablet (40 mg total) by mouth every evening.   CALCIUM 600-D 600-400 MG-UNIT tablet Generic drug:  Calcium Carbonate-Vitamin D Take 1 tablet by mouth 2 (two) times  daily.   celecoxib 200 MG capsule Commonly known as:  CELEBREX Take 200 mg by mouth daily as needed for moderate pain.   [employer] LANCING DEVICE Misc 1 each.   enoxaparin 30 MG/0.3ML injection Commonly known as:  LOVENOX Inject 0.3 mLs (30 mg total) into the skin every 12 (twelve) hours for 14 days.   FIFTY50 GLUCOSE METER 2.0 w/Device Kit Use as directed.   fluticasone 50 MCG/ACT nasal spray Commonly known as:  FLONASE Place 2 sprays into both nostrils daily. What changed:    when to take this  reasons to take this   furosemide 20 MG tablet Commonly known as:  LASIX Take 20 mg by mouth daily.   gabapentin 300 MG capsule Commonly known as:  NEURONTIN Take 1 capsule (300 mg total) by mouth every evening.   glucose blood test strip Commonly known as:  ONE TOUCH ULTRA TEST Use as instructed   imipramine 25 MG tablet Commonly known as:  TOFRANIL Take 25 mg by mouth every evening.   Insulin Pen Needle 30G X 8 MM Misc Commonly known as:  NOVOFINE AUTOCOVER Inject one needle daily with Victoza injection.  Dx: E11.40  DM with diabetic neuropathy   ketoconazole 2 % cream Commonly known as:  NIZORAL Apply 1 application topically 2  (two) times daily as needed (rash).   KLOR-CON 10 10 MEQ tablet Generic drug:  potassium chloride Take 10 mEq by mouth daily.   latanoprost 0.005 % ophthalmic solution Commonly known as:  XALATAN Place 1 drop into both eyes at bedtime.   Magnesium Oxide 500 MG Caps Take 500 mg by mouth daily.   Medical Compression Stockings Misc   metoprolol succinate 100 MG 24 hr tablet Commonly known as:  TOPROL-XL Take 1 tablet (100 mg total) by mouth every evening. Take with or immediately following a meal.   oxyCODONE 5 MG immediate release tablet Commonly known as:  Oxy IR/ROXICODONE Take 1-2 tablets (5-10 mg total) by mouth every 3 (three) hours as needed for severe pain.   Semaglutide 0.25 or 0.5 MG/DOSE Sopn Commonly known as:  OZEMPIC Inject 0.5 mg into the skin once a week.   traZODone 50 MG tablet Commonly known as:  DESYREL TAKE 1/2 TO 1 TABLET (25-50 MG TOTAL) BY MOUTH AT BEDTIME AS NEEDED FOR SLEEP. What changed:  See the new instructions.            Durable Medical Equipment  (From admission, onward)        Start     Ordered   11/20/17 1135  For home use only DME Bedside commode  Once    Question:  Patient needs a bedside commode to treat with the following condition  Answer:  S/P revision of total knee, right   11/20/17 1136      Diagnostic Studies: US Venous Img Lower Unilateral Right  Result Date: 11/05/2017 CLINICAL DATA:  Right leg swelling EXAM: RIGHT LOWER EXTREMITY VENOUS DOPPLER ULTRASOUND TECHNIQUE: Gray-scale sonography with graded compression, as well as color Doppler and duplex ultrasound were performed to evaluate the lower extremity deep venous systems from the level of the common femoral vein and including the common femoral, femoral, profunda femoral, popliteal and calf veins including the posterior tibial, peroneal and gastrocnemius veins when visible. The superficial great saphenous vein was also interrogated. Spectral Doppler was utilized to  evaluate flow at rest and with distal augmentation maneuvers in the common femoral, femoral and popliteal veins. COMPARISON:  None. FINDINGS: Contralateral Common Femoral  Vein: Respiratory phasicity is normal and symmetric with the symptomatic side. No evidence of thrombus. Normal compressibility. Common Femoral Vein: No evidence of thrombus. Normal compressibility, respiratory phasicity and response to augmentation. Saphenofemoral Junction: No evidence of thrombus. Normal compressibility and flow on color Doppler imaging. Profunda Femoral Vein: No evidence of thrombus. Normal compressibility and flow on color Doppler imaging. Femoral Vein: No evidence of thrombus. Normal compressibility, respiratory phasicity and response to augmentation. Popliteal Vein: No evidence of thrombus. Normal compressibility, respiratory phasicity and response to augmentation. Calf Veins: No evidence of thrombus. Normal compressibility and flow on color Doppler imaging. Superficial Great Saphenous Vein: No evidence of thrombus. Normal compressibility. Venous Reflux:  None. Other Findings: Cystic lesion in the popliteal fossa is noted which measures approximately 20 cm in length but 5.4 cm in width. This is consistent with an extensive popliteal cyst. This could be related to a ruptured popliteal cyst. Diffuse calf edema is noted. Note is made of multiple varices in the region of the right groin. IMPRESSION: No evidence of deep venous thrombosis. Large popliteal cyst as described. These may represent sequelae from a ruptured popliteal cyst given the extensive nature extending into the calf. Calf edema is noted. Multiple right groin varices are seen. Electronically Signed   By: Inez Catalina M.D.   On: 11/05/2017 16:28   [place] Ekg Site Rite  Result Date: 11/20/2017 If Site Rite image not attached, placement could not be confirmed due to current cardiac rhythm.   Disposition: 03-Skilled Nursing Facility  Discharge Instructions     Increase activity slowly   Complete by:  As directed          Signed: WOLFE,JON R. 11/22/2017, 9:50 AM

## 2017-11-22 NOTE — Progress Notes (Signed)
Patient a&o, VSS. Pain controlled. Dressing dry and intact on right knee. NPWT in place, dry and intact. No complaints. Plan to d/c home today with IV abx.

## 2017-11-22 NOTE — Care Management Note (Signed)
Case Management Note  Patient Details  Name: Becky Trevino MRN: [id number] Date of Birth: 08/08/1946  Subjective/Objective:   Discharged Becky Trevino's home today with PICC line. IV Vancomycin and Rocephin were delivered to the residence this morning by the [facility] per Mel[facility] Muller at [facility]. A home health nurse from [facility] will arrive at Becky Trevino's residence today between 2pm and 3pm to teach family how to administer IV ABX via the PICC per [place].                  Action/Plan:   Expected Discharge Date:  11/22/17               Expected Discharge Plan:  [place]  In-House Referral:     Discharge planning Services  CM Consult  Post Acute Care Choice:  Home Health Choice offered to:  Patient  DME Arranged:    DME Agency:     HH Arranged:  RN, PT [place] Agency:  [place]  Status of Service:  Completed, signed off  If discussed at [place] of Stay Meetings, dates discussed:    Additional Comments:  Trecia Maring A, RN 11/22/2017, 11:47 AM

## 2017-11-22 NOTE — Progress Notes (Signed)
   Subjective: 3 Days Post-Op Procedure(s) (LRB): RIGHT KNEE POLY EXCHANGE WITH IRRIGATION AND DEBRIDEMENT (Right) Patient reports pain as mild.   Patient is well, and has had no acute complaints or problems Has done fair with physical therapy Plan is to go Home after hospital stay. no nausea and no vomiting Patient denies any chest pains or shortness of breath. Patient has been set up for home health IV and antibiotics have been ordered.  Objective: Vital signs in last 24 hours: Temp:  [97.5 F (36.4 C)-99.1 F (37.3 C)] 99.1 F (37.3 C) (03/10 0711) Pulse Rate:  [85-100] 86 (03/10 0711) Resp:  [18] 18 (03/10 0711) BP: (93-108)/(48-59) 93/51 (03/10 0711) SpO2:  [91 %-100 %] 100 % (03/10 0711) Wound VAC in place Heels are non tender and elevated off the bed using rolled towels Intake/Output from previous day: 03/09 0701 - 03/10 0700 In: 260 [P.O.:240; I.V.:20] Out: -  Intake/Output this shift: No intake/output data recorded.  Recent Labs    11/19/17 1227 11/20/17 0543 11/21/17 0609  HGB 11.3* 10.3* 9.8*   Recent Labs    11/20/17 0543 11/21/17 0609  WBC 8.5 8.3  RBC 3.11* 3.15*  HCT 29.4* 29.8*  PLT 262 253   Recent Labs    11/20/17 0543 11/21/17 0609 11/22/17 0508  NA 136 137  --   K 4.6 4.2  --   CL 104 106  --   CO2 24 24  --   BUN 20 21*  --   CREATININE 1.73* 1.23* 1.25*  GLUCOSE 123* 104*  --   CALCIUM 8.5* 8.7*  --    No results for input(s): LABPT, INR in the last 72 hours.  EXAM General - Patient is Alert, Appropriate and Oriented Extremity - Neurologically intact Neurovascular intact Sensation intact distally Intact pulses distally Dorsiflexion/Plantar flexion intact No cellulitis present Compartment soft Dressing - Wound VAC in place and is working Motor Function - intact, moving foot and toes well on exam.    Past Medical History:  Diagnosis Date  . Cervical spondylosis   . Chronic kidney disease    Stage 3  . DDD  (degenerative disc disease), cervical    [facility]  . Diabetes mellitus without complication ([place])   . Diverticulosis   . Hyperlipidemia   . Hypertension   . Intertrigo   . Leg cramps   . Leg varices   . Obesity   . OSA (obstructive sleep apnea)   . Symptomatic menopausal or female climacteric states     Assessment/Plan: 3 Days Post-Op Procedure(s) (LRB): RIGHT KNEE POLY EXCHANGE WITH IRRIGATION AND DEBRIDEMENT (Right) Active Problems:   Infection of total right knee replacement (HCC)  Estimated body mass index is 34.87 kg/m as calculated from the following:   Height as of this encounter: 5\' 11"  (1.803 m).   Weight as of this encounter: 113.4 kg (250 lb). Up with therapy Discharge home with home health  Labs: cultures showing staph coagulase neg DVT Prophylaxis - Lovenox, Foot Pumps and TED hose Weight-Bearing as tolerated to right leg Please change wound VAC to Praveena at discharge. Be sure TED stockings on both legs.  Jillyn Ledger. [place] 11/22/2017, 9:31 AM

## 2017-11-22 NOTE — Progress Notes (Signed)
Patient discharging home with home health. Instructions and prescription given to patient, verbalized understanding. Daughter transporting patient home.

## 2017-11-22 NOTE — Discharge Instructions (Signed)

## 2017-11-23 DIAGNOSIS — Z5181 Encounter for therapeutic drug level monitoring: Secondary | ICD-10-CM | POA: Diagnosis not present

## 2017-11-23 DIAGNOSIS — Z792 Long term (current) use of antibiotics: Secondary | ICD-10-CM | POA: Diagnosis not present

## 2017-11-23 DIAGNOSIS — Z96652 Presence of left artificial knee joint: Secondary | ICD-10-CM | POA: Diagnosis not present

## 2017-11-23 DIAGNOSIS — G4733 Obstructive sleep apnea (adult) (pediatric): Secondary | ICD-10-CM | POA: Diagnosis not present

## 2017-11-23 DIAGNOSIS — E1122 Type 2 diabetes mellitus with diabetic chronic kidney disease: Secondary | ICD-10-CM | POA: Diagnosis not present

## 2017-11-23 DIAGNOSIS — T8453XA Infection and inflammatory reaction due to internal right knee prosthesis, initial encounter: Secondary | ICD-10-CM | POA: Diagnosis not present

## 2017-11-23 DIAGNOSIS — Z96642 Presence of left artificial hip joint: Secondary | ICD-10-CM | POA: Diagnosis not present

## 2017-11-23 DIAGNOSIS — Z452 Encounter for adjustment and management of vascular access device: Secondary | ICD-10-CM | POA: Diagnosis not present

## 2017-11-23 DIAGNOSIS — N183 Chronic kidney disease, stage 3 (moderate): Secondary | ICD-10-CM | POA: Diagnosis not present

## 2017-11-23 DIAGNOSIS — Z791 Long term (current) use of non-steroidal anti-inflammatories (NSAID): Secondary | ICD-10-CM | POA: Diagnosis not present

## 2017-11-23 DIAGNOSIS — I129 Hypertensive chronic kidney disease with stage 1 through stage 4 chronic kidney disease, or unspecified chronic kidney disease: Secondary | ICD-10-CM | POA: Diagnosis not present

## 2017-11-23 LAB — GLUCOSE, CAPILLARY: GLUCOSE-CAPILLARY: 98 mg/dL (ref 65–99)

## 2017-11-23 NOTE — Care Management (Signed)
Dr. Rudene Christians in and requesting wound vac at home for patient. Notified Jason with [facility]. He is coming now to speak further with Dr. Rudene Christians.

## 2017-11-24 DIAGNOSIS — Z792 Long term (current) use of antibiotics: Secondary | ICD-10-CM | POA: Diagnosis not present

## 2017-11-24 DIAGNOSIS — I129 Hypertensive chronic kidney disease with stage 1 through stage 4 chronic kidney disease, or unspecified chronic kidney disease: Secondary | ICD-10-CM | POA: Diagnosis not present

## 2017-11-24 DIAGNOSIS — T8453XA Infection and inflammatory reaction due to internal right knee prosthesis, initial encounter: Secondary | ICD-10-CM | POA: Diagnosis not present

## 2017-11-24 DIAGNOSIS — Z452 Encounter for adjustment and management of vascular access device: Secondary | ICD-10-CM | POA: Diagnosis not present

## 2017-11-24 DIAGNOSIS — Z791 Long term (current) use of non-steroidal anti-inflammatories (NSAID): Secondary | ICD-10-CM | POA: Diagnosis not present

## 2017-11-24 DIAGNOSIS — Z5181 Encounter for therapeutic drug level monitoring: Secondary | ICD-10-CM | POA: Diagnosis not present

## 2017-11-24 DIAGNOSIS — N183 Chronic kidney disease, stage 3 (moderate): Secondary | ICD-10-CM | POA: Diagnosis not present

## 2017-11-24 DIAGNOSIS — Z96652 Presence of left artificial knee joint: Secondary | ICD-10-CM | POA: Diagnosis not present

## 2017-11-24 DIAGNOSIS — E1122 Type 2 diabetes mellitus with diabetic chronic kidney disease: Secondary | ICD-10-CM | POA: Diagnosis not present

## 2017-11-24 DIAGNOSIS — Z96642 Presence of left artificial hip joint: Secondary | ICD-10-CM | POA: Diagnosis not present

## 2017-11-24 DIAGNOSIS — G4733 Obstructive sleep apnea (adult) (pediatric): Secondary | ICD-10-CM | POA: Diagnosis not present

## 2017-11-24 LAB — AEROBIC/ANAEROBIC CULTURE (SURGICAL/DEEP WOUND): GRAM STAIN: NONE SEEN

## 2017-11-24 LAB — AEROBIC/ANAEROBIC CULTURE W GRAM STAIN (SURGICAL/DEEP WOUND)
Culture: NO GROWTH
Gram Stain: NONE SEEN
Gram Stain: NONE SEEN

## 2017-11-24 LAB — CULTURE, BLOOD (ROUTINE X 2)
CULTURE: NO GROWTH
Culture: NO GROWTH
SPECIAL REQUESTS: ADEQUATE

## 2017-11-25 DIAGNOSIS — Z792 Long term (current) use of antibiotics: Secondary | ICD-10-CM | POA: Diagnosis not present

## 2017-11-25 DIAGNOSIS — Z96652 Presence of left artificial knee joint: Secondary | ICD-10-CM | POA: Diagnosis not present

## 2017-11-25 DIAGNOSIS — Z5181 Encounter for therapeutic drug level monitoring: Secondary | ICD-10-CM | POA: Diagnosis not present

## 2017-11-25 DIAGNOSIS — E1122 Type 2 diabetes mellitus with diabetic chronic kidney disease: Secondary | ICD-10-CM | POA: Diagnosis not present

## 2017-11-25 DIAGNOSIS — T8453XA Infection and inflammatory reaction due to internal right knee prosthesis, initial encounter: Secondary | ICD-10-CM | POA: Diagnosis not present

## 2017-11-25 DIAGNOSIS — N183 Chronic kidney disease, stage 3 (moderate): Secondary | ICD-10-CM | POA: Diagnosis not present

## 2017-11-25 DIAGNOSIS — Z791 Long term (current) use of non-steroidal anti-inflammatories (NSAID): Secondary | ICD-10-CM | POA: Diagnosis not present

## 2017-11-25 DIAGNOSIS — Z96642 Presence of left artificial hip joint: Secondary | ICD-10-CM | POA: Diagnosis not present

## 2017-11-25 DIAGNOSIS — I129 Hypertensive chronic kidney disease with stage 1 through stage 4 chronic kidney disease, or unspecified chronic kidney disease: Secondary | ICD-10-CM | POA: Diagnosis not present

## 2017-11-25 DIAGNOSIS — G4733 Obstructive sleep apnea (adult) (pediatric): Secondary | ICD-10-CM | POA: Diagnosis not present

## 2017-11-25 DIAGNOSIS — Z452 Encounter for adjustment and management of vascular access device: Secondary | ICD-10-CM | POA: Diagnosis not present

## 2017-11-26 DIAGNOSIS — Z96652 Presence of left artificial knee joint: Secondary | ICD-10-CM | POA: Diagnosis not present

## 2017-11-26 DIAGNOSIS — I129 Hypertensive chronic kidney disease with stage 1 through stage 4 chronic kidney disease, or unspecified chronic kidney disease: Secondary | ICD-10-CM | POA: Diagnosis not present

## 2017-11-26 DIAGNOSIS — Z96642 Presence of left artificial hip joint: Secondary | ICD-10-CM | POA: Diagnosis not present

## 2017-11-26 DIAGNOSIS — Z791 Long term (current) use of non-steroidal anti-inflammatories (NSAID): Secondary | ICD-10-CM | POA: Diagnosis not present

## 2017-11-26 DIAGNOSIS — Z452 Encounter for adjustment and management of vascular access device: Secondary | ICD-10-CM | POA: Diagnosis not present

## 2017-11-26 DIAGNOSIS — G4733 Obstructive sleep apnea (adult) (pediatric): Secondary | ICD-10-CM | POA: Diagnosis not present

## 2017-11-26 DIAGNOSIS — T8453XA Infection and inflammatory reaction due to internal right knee prosthesis, initial encounter: Secondary | ICD-10-CM | POA: Diagnosis not present

## 2017-11-26 DIAGNOSIS — Z5181 Encounter for therapeutic drug level monitoring: Secondary | ICD-10-CM | POA: Diagnosis not present

## 2017-11-26 DIAGNOSIS — Z792 Long term (current) use of antibiotics: Secondary | ICD-10-CM | POA: Diagnosis not present

## 2017-11-26 DIAGNOSIS — N183 Chronic kidney disease, stage 3 (moderate): Secondary | ICD-10-CM | POA: Diagnosis not present

## 2017-11-26 DIAGNOSIS — E1122 Type 2 diabetes mellitus with diabetic chronic kidney disease: Secondary | ICD-10-CM | POA: Diagnosis not present

## 2017-11-27 DIAGNOSIS — G4733 Obstructive sleep apnea (adult) (pediatric): Secondary | ICD-10-CM | POA: Diagnosis not present

## 2017-11-27 DIAGNOSIS — Z5181 Encounter for therapeutic drug level monitoring: Secondary | ICD-10-CM | POA: Diagnosis not present

## 2017-11-27 DIAGNOSIS — E1122 Type 2 diabetes mellitus with diabetic chronic kidney disease: Secondary | ICD-10-CM | POA: Diagnosis not present

## 2017-11-27 DIAGNOSIS — N183 Chronic kidney disease, stage 3 (moderate): Secondary | ICD-10-CM | POA: Diagnosis not present

## 2017-11-27 DIAGNOSIS — Z96642 Presence of left artificial hip joint: Secondary | ICD-10-CM | POA: Diagnosis not present

## 2017-11-27 DIAGNOSIS — I129 Hypertensive chronic kidney disease with stage 1 through stage 4 chronic kidney disease, or unspecified chronic kidney disease: Secondary | ICD-10-CM | POA: Diagnosis not present

## 2017-11-27 DIAGNOSIS — Z792 Long term (current) use of antibiotics: Secondary | ICD-10-CM | POA: Diagnosis not present

## 2017-11-27 DIAGNOSIS — T8453XA Infection and inflammatory reaction due to internal right knee prosthesis, initial encounter: Secondary | ICD-10-CM | POA: Diagnosis not present

## 2017-11-27 DIAGNOSIS — Z791 Long term (current) use of non-steroidal anti-inflammatories (NSAID): Secondary | ICD-10-CM | POA: Diagnosis not present

## 2017-11-27 DIAGNOSIS — Z96652 Presence of left artificial knee joint: Secondary | ICD-10-CM | POA: Diagnosis not present

## 2017-11-27 DIAGNOSIS — Z452 Encounter for adjustment and management of vascular access device: Secondary | ICD-10-CM | POA: Diagnosis not present

## 2017-11-29 DIAGNOSIS — T8189XA Other complications of procedures, not elsewhere classified, initial encounter: Secondary | ICD-10-CM | POA: Diagnosis not present

## 2017-11-29 DIAGNOSIS — T8453XA Infection and inflammatory reaction due to internal right knee prosthesis, initial encounter: Secondary | ICD-10-CM | POA: Diagnosis not present

## 2017-11-30 DIAGNOSIS — I129 Hypertensive chronic kidney disease with stage 1 through stage 4 chronic kidney disease, or unspecified chronic kidney disease: Secondary | ICD-10-CM | POA: Diagnosis not present

## 2017-11-30 DIAGNOSIS — T8453XA Infection and inflammatory reaction due to internal right knee prosthesis, initial encounter: Secondary | ICD-10-CM | POA: Diagnosis not present

## 2017-11-30 DIAGNOSIS — E1122 Type 2 diabetes mellitus with diabetic chronic kidney disease: Secondary | ICD-10-CM | POA: Diagnosis not present

## 2017-11-30 DIAGNOSIS — Z791 Long term (current) use of non-steroidal anti-inflammatories (NSAID): Secondary | ICD-10-CM | POA: Diagnosis not present

## 2017-11-30 DIAGNOSIS — Z5181 Encounter for therapeutic drug level monitoring: Secondary | ICD-10-CM | POA: Diagnosis not present

## 2017-11-30 DIAGNOSIS — Z792 Long term (current) use of antibiotics: Secondary | ICD-10-CM | POA: Diagnosis not present

## 2017-11-30 DIAGNOSIS — Z96642 Presence of left artificial hip joint: Secondary | ICD-10-CM | POA: Diagnosis not present

## 2017-11-30 DIAGNOSIS — N183 Chronic kidney disease, stage 3 (moderate): Secondary | ICD-10-CM | POA: Diagnosis not present

## 2017-11-30 DIAGNOSIS — Z96652 Presence of left artificial knee joint: Secondary | ICD-10-CM | POA: Diagnosis not present

## 2017-11-30 DIAGNOSIS — Z452 Encounter for adjustment and management of vascular access device: Secondary | ICD-10-CM | POA: Diagnosis not present

## 2017-11-30 DIAGNOSIS — G4733 Obstructive sleep apnea (adult) (pediatric): Secondary | ICD-10-CM | POA: Diagnosis not present

## 2017-11-30 DIAGNOSIS — Z7689 Persons encountering health services in other specified circumstances: Secondary | ICD-10-CM | POA: Diagnosis not present

## 2017-12-03 DIAGNOSIS — Z452 Encounter for adjustment and management of vascular access device: Secondary | ICD-10-CM | POA: Diagnosis not present

## 2017-12-03 DIAGNOSIS — Z791 Long term (current) use of non-steroidal anti-inflammatories (NSAID): Secondary | ICD-10-CM | POA: Diagnosis not present

## 2017-12-03 DIAGNOSIS — Z96652 Presence of left artificial knee joint: Secondary | ICD-10-CM | POA: Diagnosis not present

## 2017-12-03 DIAGNOSIS — Z5181 Encounter for therapeutic drug level monitoring: Secondary | ICD-10-CM | POA: Diagnosis not present

## 2017-12-03 DIAGNOSIS — Z792 Long term (current) use of antibiotics: Secondary | ICD-10-CM | POA: Diagnosis not present

## 2017-12-03 DIAGNOSIS — G4733 Obstructive sleep apnea (adult) (pediatric): Secondary | ICD-10-CM | POA: Diagnosis not present

## 2017-12-03 DIAGNOSIS — Z96642 Presence of left artificial hip joint: Secondary | ICD-10-CM | POA: Diagnosis not present

## 2017-12-03 DIAGNOSIS — E1122 Type 2 diabetes mellitus with diabetic chronic kidney disease: Secondary | ICD-10-CM | POA: Diagnosis not present

## 2017-12-03 DIAGNOSIS — T8453XA Infection and inflammatory reaction due to internal right knee prosthesis, initial encounter: Secondary | ICD-10-CM | POA: Diagnosis not present

## 2017-12-03 DIAGNOSIS — I129 Hypertensive chronic kidney disease with stage 1 through stage 4 chronic kidney disease, or unspecified chronic kidney disease: Secondary | ICD-10-CM | POA: Diagnosis not present

## 2017-12-03 DIAGNOSIS — N183 Chronic kidney disease, stage 3 (moderate): Secondary | ICD-10-CM | POA: Diagnosis not present

## 2017-12-04 DIAGNOSIS — Z792 Long term (current) use of antibiotics: Secondary | ICD-10-CM | POA: Diagnosis not present

## 2017-12-04 DIAGNOSIS — T8450XD Infection and inflammatory reaction due to unspecified internal joint prosthesis, subsequent encounter: Secondary | ICD-10-CM | POA: Diagnosis not present

## 2017-12-05 DIAGNOSIS — T8189XA Other complications of procedures, not elsewhere classified, initial encounter: Secondary | ICD-10-CM | POA: Diagnosis not present

## 2017-12-05 DIAGNOSIS — T8453XA Infection and inflammatory reaction due to internal right knee prosthesis, initial encounter: Secondary | ICD-10-CM | POA: Diagnosis not present

## 2017-12-07 DIAGNOSIS — Z791 Long term (current) use of non-steroidal anti-inflammatories (NSAID): Secondary | ICD-10-CM | POA: Diagnosis not present

## 2017-12-07 DIAGNOSIS — I129 Hypertensive chronic kidney disease with stage 1 through stage 4 chronic kidney disease, or unspecified chronic kidney disease: Secondary | ICD-10-CM | POA: Diagnosis not present

## 2017-12-07 DIAGNOSIS — Z792 Long term (current) use of antibiotics: Secondary | ICD-10-CM | POA: Diagnosis not present

## 2017-12-07 DIAGNOSIS — E1122 Type 2 diabetes mellitus with diabetic chronic kidney disease: Secondary | ICD-10-CM | POA: Diagnosis not present

## 2017-12-07 DIAGNOSIS — G4733 Obstructive sleep apnea (adult) (pediatric): Secondary | ICD-10-CM | POA: Diagnosis not present

## 2017-12-07 DIAGNOSIS — Z5181 Encounter for therapeutic drug level monitoring: Secondary | ICD-10-CM | POA: Diagnosis not present

## 2017-12-07 DIAGNOSIS — N183 Chronic kidney disease, stage 3 (moderate): Secondary | ICD-10-CM | POA: Diagnosis not present

## 2017-12-07 DIAGNOSIS — Z452 Encounter for adjustment and management of vascular access device: Secondary | ICD-10-CM | POA: Diagnosis not present

## 2017-12-07 DIAGNOSIS — Z96642 Presence of left artificial hip joint: Secondary | ICD-10-CM | POA: Diagnosis not present

## 2017-12-07 DIAGNOSIS — Z96652 Presence of left artificial knee joint: Secondary | ICD-10-CM | POA: Diagnosis not present

## 2017-12-07 DIAGNOSIS — T8453XA Infection and inflammatory reaction due to internal right knee prosthesis, initial encounter: Secondary | ICD-10-CM | POA: Diagnosis not present

## 2017-12-07 DIAGNOSIS — T8189XA Other complications of procedures, not elsewhere classified, initial encounter: Secondary | ICD-10-CM | POA: Diagnosis not present

## 2017-12-08 DIAGNOSIS — Z452 Encounter for adjustment and management of vascular access device: Secondary | ICD-10-CM | POA: Diagnosis not present

## 2017-12-08 DIAGNOSIS — G4733 Obstructive sleep apnea (adult) (pediatric): Secondary | ICD-10-CM | POA: Diagnosis not present

## 2017-12-08 DIAGNOSIS — I129 Hypertensive chronic kidney disease with stage 1 through stage 4 chronic kidney disease, or unspecified chronic kidney disease: Secondary | ICD-10-CM | POA: Diagnosis not present

## 2017-12-08 DIAGNOSIS — T8453XA Infection and inflammatory reaction due to internal right knee prosthesis, initial encounter: Secondary | ICD-10-CM | POA: Diagnosis not present

## 2017-12-08 DIAGNOSIS — N183 Chronic kidney disease, stage 3 (moderate): Secondary | ICD-10-CM | POA: Diagnosis not present

## 2017-12-08 DIAGNOSIS — E1122 Type 2 diabetes mellitus with diabetic chronic kidney disease: Secondary | ICD-10-CM | POA: Diagnosis not present

## 2017-12-08 DIAGNOSIS — Z96652 Presence of left artificial knee joint: Secondary | ICD-10-CM | POA: Diagnosis not present

## 2017-12-08 DIAGNOSIS — Z792 Long term (current) use of antibiotics: Secondary | ICD-10-CM | POA: Diagnosis not present

## 2017-12-08 DIAGNOSIS — Z5181 Encounter for therapeutic drug level monitoring: Secondary | ICD-10-CM | POA: Diagnosis not present

## 2017-12-08 DIAGNOSIS — Z96642 Presence of left artificial hip joint: Secondary | ICD-10-CM | POA: Diagnosis not present

## 2017-12-08 DIAGNOSIS — Z791 Long term (current) use of non-steroidal anti-inflammatories (NSAID): Secondary | ICD-10-CM | POA: Diagnosis not present

## 2017-12-10 DIAGNOSIS — I129 Hypertensive chronic kidney disease with stage 1 through stage 4 chronic kidney disease, or unspecified chronic kidney disease: Secondary | ICD-10-CM | POA: Diagnosis not present

## 2017-12-10 DIAGNOSIS — Z791 Long term (current) use of non-steroidal anti-inflammatories (NSAID): Secondary | ICD-10-CM | POA: Diagnosis not present

## 2017-12-10 DIAGNOSIS — T8453XA Infection and inflammatory reaction due to internal right knee prosthesis, initial encounter: Secondary | ICD-10-CM | POA: Diagnosis not present

## 2017-12-10 DIAGNOSIS — E1122 Type 2 diabetes mellitus with diabetic chronic kidney disease: Secondary | ICD-10-CM | POA: Diagnosis not present

## 2017-12-10 DIAGNOSIS — Z792 Long term (current) use of antibiotics: Secondary | ICD-10-CM | POA: Diagnosis not present

## 2017-12-10 DIAGNOSIS — N183 Chronic kidney disease, stage 3 (moderate): Secondary | ICD-10-CM | POA: Diagnosis not present

## 2017-12-10 DIAGNOSIS — Z96642 Presence of left artificial hip joint: Secondary | ICD-10-CM | POA: Diagnosis not present

## 2017-12-10 DIAGNOSIS — Z452 Encounter for adjustment and management of vascular access device: Secondary | ICD-10-CM | POA: Diagnosis not present

## 2017-12-10 DIAGNOSIS — Z5181 Encounter for therapeutic drug level monitoring: Secondary | ICD-10-CM | POA: Diagnosis not present

## 2017-12-10 DIAGNOSIS — G4733 Obstructive sleep apnea (adult) (pediatric): Secondary | ICD-10-CM | POA: Diagnosis not present

## 2017-12-10 DIAGNOSIS — Z96652 Presence of left artificial knee joint: Secondary | ICD-10-CM | POA: Diagnosis not present

## 2017-12-11 DIAGNOSIS — Z791 Long term (current) use of non-steroidal anti-inflammatories (NSAID): Secondary | ICD-10-CM | POA: Diagnosis not present

## 2017-12-11 DIAGNOSIS — T8453XA Infection and inflammatory reaction due to internal right knee prosthesis, initial encounter: Secondary | ICD-10-CM | POA: Diagnosis not present

## 2017-12-12 DIAGNOSIS — T8189XA Other complications of procedures, not elsewhere classified, initial encounter: Secondary | ICD-10-CM | POA: Diagnosis not present

## 2017-12-12 DIAGNOSIS — T8453XA Infection and inflammatory reaction due to internal right knee prosthesis, initial encounter: Secondary | ICD-10-CM | POA: Diagnosis not present

## 2017-12-14 DIAGNOSIS — Z452 Encounter for adjustment and management of vascular access device: Secondary | ICD-10-CM | POA: Diagnosis not present

## 2017-12-14 DIAGNOSIS — I129 Hypertensive chronic kidney disease with stage 1 through stage 4 chronic kidney disease, or unspecified chronic kidney disease: Secondary | ICD-10-CM | POA: Diagnosis not present

## 2017-12-14 DIAGNOSIS — E1122 Type 2 diabetes mellitus with diabetic chronic kidney disease: Secondary | ICD-10-CM | POA: Diagnosis not present

## 2017-12-14 DIAGNOSIS — Z792 Long term (current) use of antibiotics: Secondary | ICD-10-CM | POA: Diagnosis not present

## 2017-12-14 DIAGNOSIS — N183 Chronic kidney disease, stage 3 (moderate): Secondary | ICD-10-CM | POA: Diagnosis not present

## 2017-12-14 DIAGNOSIS — Z5181 Encounter for therapeutic drug level monitoring: Secondary | ICD-10-CM | POA: Diagnosis not present

## 2017-12-14 DIAGNOSIS — Z791 Long term (current) use of non-steroidal anti-inflammatories (NSAID): Secondary | ICD-10-CM | POA: Diagnosis not present

## 2017-12-14 DIAGNOSIS — Z96652 Presence of left artificial knee joint: Secondary | ICD-10-CM | POA: Diagnosis not present

## 2017-12-14 DIAGNOSIS — G4733 Obstructive sleep apnea (adult) (pediatric): Secondary | ICD-10-CM | POA: Diagnosis not present

## 2017-12-14 DIAGNOSIS — T8453XA Infection and inflammatory reaction due to internal right knee prosthesis, initial encounter: Secondary | ICD-10-CM | POA: Diagnosis not present

## 2017-12-14 DIAGNOSIS — Z96642 Presence of left artificial hip joint: Secondary | ICD-10-CM | POA: Diagnosis not present

## 2017-12-15 DIAGNOSIS — Z5181 Encounter for therapeutic drug level monitoring: Secondary | ICD-10-CM | POA: Diagnosis not present

## 2017-12-15 DIAGNOSIS — Z96652 Presence of left artificial knee joint: Secondary | ICD-10-CM | POA: Diagnosis not present

## 2017-12-15 DIAGNOSIS — G4733 Obstructive sleep apnea (adult) (pediatric): Secondary | ICD-10-CM | POA: Diagnosis not present

## 2017-12-15 DIAGNOSIS — E1122 Type 2 diabetes mellitus with diabetic chronic kidney disease: Secondary | ICD-10-CM | POA: Diagnosis not present

## 2017-12-15 DIAGNOSIS — T8453XA Infection and inflammatory reaction due to internal right knee prosthesis, initial encounter: Secondary | ICD-10-CM | POA: Diagnosis not present

## 2017-12-15 DIAGNOSIS — I129 Hypertensive chronic kidney disease with stage 1 through stage 4 chronic kidney disease, or unspecified chronic kidney disease: Secondary | ICD-10-CM | POA: Diagnosis not present

## 2017-12-15 DIAGNOSIS — N183 Chronic kidney disease, stage 3 (moderate): Secondary | ICD-10-CM | POA: Diagnosis not present

## 2017-12-15 DIAGNOSIS — Z452 Encounter for adjustment and management of vascular access device: Secondary | ICD-10-CM | POA: Diagnosis not present

## 2017-12-15 DIAGNOSIS — Z96642 Presence of left artificial hip joint: Secondary | ICD-10-CM | POA: Diagnosis not present

## 2017-12-15 DIAGNOSIS — Z792 Long term (current) use of antibiotics: Secondary | ICD-10-CM | POA: Diagnosis not present

## 2017-12-15 DIAGNOSIS — Z791 Long term (current) use of non-steroidal anti-inflammatories (NSAID): Secondary | ICD-10-CM | POA: Diagnosis not present

## 2017-12-17 ENCOUNTER — Ambulatory Visit: Payer: BLUE CROSS/BLUE SHIELD | Admitting: Family Medicine

## 2017-12-17 DIAGNOSIS — Z96652 Presence of left artificial knee joint: Secondary | ICD-10-CM | POA: Diagnosis not present

## 2017-12-17 DIAGNOSIS — Z452 Encounter for adjustment and management of vascular access device: Secondary | ICD-10-CM | POA: Diagnosis not present

## 2017-12-17 DIAGNOSIS — T8453XA Infection and inflammatory reaction due to internal right knee prosthesis, initial encounter: Secondary | ICD-10-CM | POA: Diagnosis not present

## 2017-12-17 DIAGNOSIS — Z96642 Presence of left artificial hip joint: Secondary | ICD-10-CM | POA: Diagnosis not present

## 2017-12-17 DIAGNOSIS — I129 Hypertensive chronic kidney disease with stage 1 through stage 4 chronic kidney disease, or unspecified chronic kidney disease: Secondary | ICD-10-CM | POA: Diagnosis not present

## 2017-12-17 DIAGNOSIS — Z792 Long term (current) use of antibiotics: Secondary | ICD-10-CM | POA: Diagnosis not present

## 2017-12-17 DIAGNOSIS — N183 Chronic kidney disease, stage 3 (moderate): Secondary | ICD-10-CM | POA: Diagnosis not present

## 2017-12-17 DIAGNOSIS — E1122 Type 2 diabetes mellitus with diabetic chronic kidney disease: Secondary | ICD-10-CM | POA: Diagnosis not present

## 2017-12-17 DIAGNOSIS — Z5181 Encounter for therapeutic drug level monitoring: Secondary | ICD-10-CM | POA: Diagnosis not present

## 2017-12-17 DIAGNOSIS — G4733 Obstructive sleep apnea (adult) (pediatric): Secondary | ICD-10-CM | POA: Diagnosis not present

## 2017-12-17 DIAGNOSIS — Z791 Long term (current) use of non-steroidal anti-inflammatories (NSAID): Secondary | ICD-10-CM | POA: Diagnosis not present

## 2017-12-19 DIAGNOSIS — T8453XA Infection and inflammatory reaction due to internal right knee prosthesis, initial encounter: Secondary | ICD-10-CM | POA: Diagnosis not present

## 2017-12-19 DIAGNOSIS — T8189XA Other complications of procedures, not elsewhere classified, initial encounter: Secondary | ICD-10-CM | POA: Diagnosis not present

## 2017-12-21 DIAGNOSIS — G4733 Obstructive sleep apnea (adult) (pediatric): Secondary | ICD-10-CM | POA: Diagnosis not present

## 2017-12-21 DIAGNOSIS — Z791 Long term (current) use of non-steroidal anti-inflammatories (NSAID): Secondary | ICD-10-CM | POA: Diagnosis not present

## 2017-12-21 DIAGNOSIS — Z5181 Encounter for therapeutic drug level monitoring: Secondary | ICD-10-CM | POA: Diagnosis not present

## 2017-12-21 DIAGNOSIS — Z96642 Presence of left artificial hip joint: Secondary | ICD-10-CM | POA: Diagnosis not present

## 2017-12-21 DIAGNOSIS — Z96652 Presence of left artificial knee joint: Secondary | ICD-10-CM | POA: Diagnosis not present

## 2017-12-21 DIAGNOSIS — Z452 Encounter for adjustment and management of vascular access device: Secondary | ICD-10-CM | POA: Diagnosis not present

## 2017-12-21 DIAGNOSIS — Z792 Long term (current) use of antibiotics: Secondary | ICD-10-CM | POA: Diagnosis not present

## 2017-12-21 DIAGNOSIS — T8453XA Infection and inflammatory reaction due to internal right knee prosthesis, initial encounter: Secondary | ICD-10-CM | POA: Diagnosis not present

## 2017-12-21 DIAGNOSIS — E1122 Type 2 diabetes mellitus with diabetic chronic kidney disease: Secondary | ICD-10-CM | POA: Diagnosis not present

## 2017-12-21 DIAGNOSIS — I129 Hypertensive chronic kidney disease with stage 1 through stage 4 chronic kidney disease, or unspecified chronic kidney disease: Secondary | ICD-10-CM | POA: Diagnosis not present

## 2017-12-21 DIAGNOSIS — N183 Chronic kidney disease, stage 3 (moderate): Secondary | ICD-10-CM | POA: Diagnosis not present

## 2017-12-23 DIAGNOSIS — Z5181 Encounter for therapeutic drug level monitoring: Secondary | ICD-10-CM | POA: Diagnosis not present

## 2017-12-23 DIAGNOSIS — Z96642 Presence of left artificial hip joint: Secondary | ICD-10-CM | POA: Diagnosis not present

## 2017-12-23 DIAGNOSIS — N183 Chronic kidney disease, stage 3 (moderate): Secondary | ICD-10-CM | POA: Diagnosis not present

## 2017-12-23 DIAGNOSIS — Z452 Encounter for adjustment and management of vascular access device: Secondary | ICD-10-CM | POA: Diagnosis not present

## 2017-12-23 DIAGNOSIS — T8453XA Infection and inflammatory reaction due to internal right knee prosthesis, initial encounter: Secondary | ICD-10-CM | POA: Diagnosis not present

## 2017-12-23 DIAGNOSIS — E1122 Type 2 diabetes mellitus with diabetic chronic kidney disease: Secondary | ICD-10-CM | POA: Diagnosis not present

## 2017-12-23 DIAGNOSIS — Z792 Long term (current) use of antibiotics: Secondary | ICD-10-CM | POA: Diagnosis not present

## 2017-12-23 DIAGNOSIS — G4733 Obstructive sleep apnea (adult) (pediatric): Secondary | ICD-10-CM | POA: Diagnosis not present

## 2017-12-23 DIAGNOSIS — Z96652 Presence of left artificial knee joint: Secondary | ICD-10-CM | POA: Diagnosis not present

## 2017-12-23 DIAGNOSIS — I129 Hypertensive chronic kidney disease with stage 1 through stage 4 chronic kidney disease, or unspecified chronic kidney disease: Secondary | ICD-10-CM | POA: Diagnosis not present

## 2017-12-23 DIAGNOSIS — Z791 Long term (current) use of non-steroidal anti-inflammatories (NSAID): Secondary | ICD-10-CM | POA: Diagnosis not present

## 2017-12-25 DIAGNOSIS — Z792 Long term (current) use of antibiotics: Secondary | ICD-10-CM | POA: Diagnosis not present

## 2017-12-25 DIAGNOSIS — T8450XD Infection and inflammatory reaction due to unspecified internal joint prosthesis, subsequent encounter: Secondary | ICD-10-CM | POA: Diagnosis not present

## 2018-01-25 DIAGNOSIS — T8450XD Infection and inflammatory reaction due to unspecified internal joint prosthesis, subsequent encounter: Secondary | ICD-10-CM | POA: Insufficient documentation

## 2018-01-25 DIAGNOSIS — Z792 Long term (current) use of antibiotics: Secondary | ICD-10-CM | POA: Diagnosis not present

## 2018-02-22 DIAGNOSIS — T8450XD Infection and inflammatory reaction due to unspecified internal joint prosthesis, subsequent encounter: Secondary | ICD-10-CM | POA: Diagnosis not present

## 2018-03-01 DIAGNOSIS — T8450XD Infection and inflammatory reaction due to unspecified internal joint prosthesis, subsequent encounter: Secondary | ICD-10-CM | POA: Diagnosis not present

## 2018-03-01 DIAGNOSIS — Z792 Long term (current) use of antibiotics: Secondary | ICD-10-CM | POA: Diagnosis not present

## 2018-03-25 ENCOUNTER — Encounter

## 2018-03-25 ENCOUNTER — Ambulatory Visit (INDEPENDENT_AMBULATORY_CARE_PROVIDER_SITE_OTHER): Payer: BLUE CROSS/BLUE SHIELD | Admitting: Family Medicine

## 2018-03-25 ENCOUNTER — Encounter: Payer: Self-pay | Admitting: Family Medicine

## 2018-03-25 VITALS — BP 130/84 | HR 79 | Temp 97.6°F | Resp 16 | Ht 71.0 in | Wt 244.2 lb

## 2018-03-25 DIAGNOSIS — E785 Hyperlipidemia, unspecified: Secondary | ICD-10-CM

## 2018-03-25 DIAGNOSIS — N3281 Overactive bladder: Secondary | ICD-10-CM

## 2018-03-25 DIAGNOSIS — I10 Essential (primary) hypertension: Secondary | ICD-10-CM | POA: Diagnosis not present

## 2018-03-25 DIAGNOSIS — D649 Anemia, unspecified: Secondary | ICD-10-CM

## 2018-03-25 DIAGNOSIS — M150 Primary generalized (osteo)arthritis: Secondary | ICD-10-CM

## 2018-03-25 DIAGNOSIS — N183 Chronic kidney disease, stage 3 unspecified: Secondary | ICD-10-CM

## 2018-03-25 DIAGNOSIS — E1140 Type 2 diabetes mellitus with diabetic neuropathy, unspecified: Secondary | ICD-10-CM | POA: Diagnosis not present

## 2018-03-25 DIAGNOSIS — E1122 Type 2 diabetes mellitus with diabetic chronic kidney disease: Secondary | ICD-10-CM

## 2018-03-25 DIAGNOSIS — M159 Polyosteoarthritis, unspecified: Secondary | ICD-10-CM

## 2018-03-25 LAB — POCT GLYCOSYLATED HEMOGLOBIN (HGB A1C): Hemoglobin A1C: 5.8 % — AB (ref 4.0–5.6)

## 2018-03-25 LAB — CBC WITH DIFFERENTIAL/PLATELET
BASOS PCT: 0.5 %
Basophils Absolute: 28 cells/uL (ref 0–200)
EOS PCT: 4.5 %
Eosinophils Absolute: 252 cells/uL (ref 15–500)
HCT: 37.4 % (ref 35.0–45.0)
HEMOGLOBIN: 12.4 g/dL (ref 11.7–15.5)
Lymphs Abs: 1310 cells/uL (ref 850–3900)
MCH: 30.5 pg (ref 27.0–33.0)
MCHC: 33.2 g/dL (ref 32.0–36.0)
MCV: 91.9 fL (ref 80.0–100.0)
MONOS PCT: 7.7 %
MPV: 10.9 fL (ref 7.5–12.5)
NEUTROS ABS: 3578 {cells}/uL (ref 1500–7800)
Neutrophils Relative %: 63.9 %
Platelets: 162 10*3/uL (ref 140–400)
RBC: 4.07 10*6/uL (ref 3.80–5.10)
RDW: 15.5 % — ABNORMAL HIGH (ref 11.0–15.0)
Total Lymphocyte: 23.4 %
WBC mixed population: 431 cells/uL (ref 200–950)
WBC: 5.6 10*3/uL (ref 3.8–10.8)

## 2018-03-25 LAB — COMPLETE METABOLIC PANEL WITH GFR
AG RATIO: 1.2 (calc) (ref 1.0–2.5)
ALBUMIN MSPROF: 3.9 g/dL (ref 3.6–5.1)
ALKALINE PHOSPHATASE (APISO): 111 U/L (ref 33–130)
ALT: 15 U/L (ref 6–29)
AST: 22 U/L (ref 10–35)
BILIRUBIN TOTAL: 0.4 mg/dL (ref 0.2–1.2)
BUN / CREAT RATIO: 17 (calc) (ref 6–22)
BUN: 17 mg/dL (ref 7–25)
CO2: 28 mmol/L (ref 20–32)
Calcium: 9.4 mg/dL (ref 8.6–10.4)
Chloride: 105 mmol/L (ref 98–110)
Creat: 1.01 mg/dL — ABNORMAL HIGH (ref 0.60–0.93)
GFR, EST AFRICAN AMERICAN: 64 mL/min/{1.73_m2} (ref >=60)
GFR, Est Non African American: 56 mL/min/{1.73_m2} — ABNORMAL LOW (ref >=60)
GLUCOSE: 100 mg/dL — AB (ref 65–99)
Globulin: 3.3 g/dL (calc) (ref 1.9–3.7)
POTASSIUM: 4.6 mmol/L (ref 3.5–5.3)
SODIUM: 140 mmol/L (ref 135–146)
Total Protein: 7.2 g/dL (ref 6.1–8.1)

## 2018-03-25 MED ORDER — METOPROLOL SUCCINATE ER 100 MG PO TB24: 100.0000 mg | ORAL_TABLET | Freq: Every evening | ORAL | 1 refills | Status: DC

## 2018-03-25 NOTE — Progress Notes (Signed)
Name: Becky Trevino   MRN: [id number]    DOB: November 03, 1945   Date:03/26/2018       Progress Note  Subjective  Chief Complaint  Chief Complaint  Patient presents with  . Diabetes    4 month recheck  . Hypertension  . Hyperlipidemia    HPI  DMII with renal manifestation and neuropathy: fsbs not getting checked at home lately. HbgA1C is still at goal.- today 5.8%  Urine micro was back to normal, not on ARB because of angiodema. Doing well, no polyphagia or polydipsia, she has urinary frequency but may be from bladder problems - she was under the care of Dr. Jacqlyn Larsen but since he left she would like to see a local physician.  She sees Podiatrist she has corns and neuropathy, last exam was 10/2017, she is on gabapentin. She has been off medication and glucose is at goal. She is happy with her weight loss.   Obesity Morbid: she is doing better since last visit 08/2017. Her weight was 262 lb, but since revision of right knee replacement March 2019 she has been more active and has lost 18 lbs. She has DM, insulin resistance,she was  Victoza and is trying to lose weight, but she states medications was so expensive and it did not help with weight loss.   HTN: bp is at goal today, she has been takingMetoprolol , no dizziness, no chest pain or palpitation  Hyperlipidemia: taking Atorvastatin daily and denies side effects. No chest pain , no muscles pain. Reviewed labs with patient today   Insomnia:  She is wearing CPAP and is doing well, taking trazodone very seldom.  Cervical DDD: s/p neck fusion in March 2015, she is doing better, still has poor rom of neck, taking gabapentin every night.  Unchanged   OSA: she uses CPAP every night, for most of the night and sometimes all night. Tolerates it well.   OA: multi joints, sees Ortho, Dr. Rudene Christians. Both kneestaking Tylenol and stable at this time. She had revision of right knee replacement March 2019 because of infection. Still under the care of  Ortho and also ID, on two antibiotics and is tolerating it well.   Anemia ;after knee surgery, we will recheck it today, SOB , no pica.   Patient Active Problem List   Diagnosis Date Noted  . Infection and inflammatory reaction due to internal joint prosthesis, subsequent encounter 01/25/2018  . Infection of total right knee replacement ([place]) 11/19/2017  . Type 2 diabetes mellitus with diabetic neuropathy, without long-term current use of insulin ([place]) 11/06/2016  . Primary osteoarthritis of knee 03/11/2016  . Primary osteoarthritis of left hip 12/11/2015  . Benign essential HTN 04/21/2015  . Edema leg 04/21/2015  . Chronic kidney disease (CKD), stage III (moderate) ([place]) 04/21/2015  . Chronic venous insufficiency 04/21/2015  . DDD (degenerative disc disease), cervical 04/21/2015  . Diabetes mellitus with renal manifestation ([place]) 04/21/2015  . Dyslipidemia 04/21/2015  . Bladder cystocele 04/21/2015  . Urinary incontinence 04/21/2015  . Leg varices 04/21/2015  . Insomnia 04/21/2015  . Eczema intertrigo 04/21/2015  . Obesity, morbid ([place]) 04/21/2015  . Obstructive apnea 04/21/2015  . Menopausal and perimenopausal disorder 04/21/2015  . Supraventricular premature beats 04/21/2015  . Osteoarthritis of multiple joints 04/21/2015  . H/O Spinal surgery 11/14/2013  . Detrusor muscle hypertonia 11/07/2013    Past Surgical History:  Procedure Laterality Date  . ABDOMINAL HYSTERECTOMY  1993   Total  . BUNIONECTOMY Bilateral 1993  . I&D KNEE  WITH POLY EXCHANGE Right 11/19/2017   Procedure: RIGHT KNEE POLY EXCHANGE WITH IRRIGATION AND DEBRIDEMENT;  Surgeon: Hessie Knows, MD;  Location: [facility] ORS;  Service: Orthopedics;  Laterality: Right;  . JOINT REPLACEMENT     bilateral knee  . LAMINECTOMY  11/14/2013    Cervical Fusion , [facility], Dr. Delilah Shan  . LASER ABLATION Bilateral 07/29/2012   Dr. Lucky Cowboy  . REPLACEMENT TOTAL KNEE Left 03/2010   Dr. Rudene Christians  . TOTAL HIP ARTHROPLASTY Left 12/11/2015    Procedure: TOTAL HIP ARTHROPLASTY ANTERIOR APPROACH;  Surgeon: Hessie Knows, MD;  Location: [facility] ORS;  Service: Orthopedics;  Laterality: Left;  . TOTAL KNEE ARTHROPLASTY Right 03/11/2016   Procedure: TOTAL KNEE ARTHROPLASTY;  Surgeon: Hessie Knows, MD;  Location: [facility] ORS;  Service: Orthopedics;  Laterality: Right;    Family History  Problem Relation Age of Onset  . Diabetes Mother   . Hyperlipidemia Mother   . Hypertension Mother   . Diabetes Father   . Hyperlipidemia Father   . Hypertension Father   . Obesity Father   . Hypertension Sister   . Hyperlipidemia Sister   . Hyperlipidemia Sister   . Breast cancer Neg Hx     Social History   Socioeconomic History  . Marital status: Married    Spouse name: Not on file  . Number of children: Not on file  . Years of education: Not on file  . Highest education level: Not on file  Occupational History  . Not on file  Social Needs  . Financial resource strain: Not on file  . Food insecurity:    Worry: Not on file    Inability: Not on file  . Transportation needs:    Medical: Not on file    Non-medical: Not on file  Tobacco Use  . Smoking status: Never Smoker  . Smokeless tobacco: Never Used  Substance and Sexual Activity  . Alcohol use: No    Alcohol/week: 0.0 oz  . Drug use: No  . Sexual activity: Yes    Partners: Male  Lifestyle  . Physical activity:    Days per week: Not on file    Minutes per session: Not on file  . Stress: Not on file  Relationships  . Social connections:    Talks on phone: Not on file    Gets together: Not on file    Attends religious service: Not on file    Active member of club or organization: Not on file    Attends meetings of clubs or organizations: Not on file    Relationship status: Not on file  . Intimate partner violence:    Fear of current or ex partner: Not on file    Emotionally abused: Not on file    Physically abused: Not on file    Forced sexual activity: Not on file  Other  Topics Concern  . Not on file  Social History Narrative  . Not on file     Current Outpatient Medications:  .  acetaminophen (TYLENOL) 650 MG CR tablet, Take 650-1,300 mg by mouth 2 (two) times daily as needed for pain., Disp: , Rfl:  .  atorvastatin (LIPITOR) 40 MG tablet, Take 1 tablet (40 mg total) by mouth every evening., Disp: 90 tablet, Rfl: 1 .  Blood Glucose Monitoring Suppl (FIFTY50 GLUCOSE METER 2.0) w/Device KIT, Use as directed., Disp: , Rfl:  .  Calcium Carbonate-Vitamin D (CALCIUM 600-D) 600-400 MG-UNIT per tablet, Take 1 tablet by mouth 2 (two) times daily., Disp: ,  Rfl:  .  celecoxib (CELEBREX) 200 MG capsule, Take 200 mg by mouth daily as needed for moderate pain. , Disp: , Rfl: 11 .  doxycycline (VIBRAMYCIN) 100 MG capsule, Take 1 capsule by mouth 2 (two) times daily., Disp: , Rfl:  .  Elastic Bandages & Supports (MEDICAL COMPRESSION STOCKINGS) MISC, , Disp: , Rfl:  .  fluticasone (FLONASE) 50 MCG/ACT nasal spray, Place 2 sprays into both nostrils daily. (Patient taking differently: Place 2 sprays into both nostrils daily as needed for allergies. ), Disp: 16 g, Rfl: 2 .  furosemide (LASIX) 20 MG tablet, Take 20 mg by mouth daily., Disp: , Rfl: 6 .  gabapentin (NEURONTIN) 300 MG capsule, Take 1 capsule (300 mg total) by mouth every evening., Disp: 90 capsule, Rfl: 2 .  glucose blood (ONE TOUCH ULTRA TEST) test strip, Use as instructed, Disp: 100 each, Rfl: 12 .  imipramine (TOFRANIL) 25 MG tablet, Take 25 mg by mouth every evening. , Disp: , Rfl:  .  Insulin Pen Needle (NOVOFINE AUTOCOVER) 30G X 8 MM MISC, Inject one needle daily with Victoza injection.  Dx: E11.40  DM with diabetic neuropathy, Disp: 100 each, Rfl: 1 .  ketoconazole (NIZORAL) 2 % cream, Apply 1 application topically 2 (two) times daily as needed (rash). , Disp: , Rfl:  .  KLOR-CON 10 10 MEQ tablet, Take 10 mEq by mouth daily. , Disp: , Rfl: 3 .  Lancet Devices ([employer] LANCING DEVICE) MISC, 1 each., Disp: , Rfl:   .  latanoprost (XALATAN) 0.005 % ophthalmic solution, Place 1 drop into both eyes at bedtime. , Disp: , Rfl:  .  Magnesium Oxide 500 MG CAPS, Take 500 mg by mouth daily. , Disp: , Rfl:  .  metoprolol succinate (TOPROL-XL) 100 MG 24 hr tablet, Take 1 tablet (100 mg total) by mouth every evening. Take with or immediately following a meal., Disp: 90 tablet, Rfl: 1 .  oxyCODONE (OXY IR/ROXICODONE) 5 MG immediate release tablet, Take 1-2 tablets (5-10 mg total) by mouth every 3 (three) hours as needed for severe pain., Disp: 60 tablet, Rfl: 0 .  traZODone (DESYREL) 50 MG tablet, TAKE 1/2 TO 1 TABLET (25-50 MG TOTAL) BY MOUTH AT BEDTIME AS NEEDED FOR SLEEP. (Patient taking differently: Take 50 mg by mouth at bedtime as needed for sleep), Disp: 30 tablet, Rfl: 5 .  rifampin (RIFADIN) 300 MG capsule, Take 300 mg by mouth 2 (two) times daily., Disp: , Rfl: 5  Allergies  Allergen Reactions  . Lisinopril Swelling     ROS  Constitutional: Negative for fever, positive for  weight change.  Respiratory: Negative for cough and shortness of breath.   Cardiovascular: Negative for chest pain or palpitations.  Gastrointestinal: Negative for abdominal pain, no bowel changes.  Musculoskeletal: Positive for gait problem and right knee  joint swelling.  Skin: Negative for rash.  Neurological: Negative for dizziness or headache.  No other specific complaints in a complete review of systems (except as listed in HPI above).  Objective  Vitals:   03/25/18 0751  BP: 130/84  Pulse: 79  Resp: 16  Temp: 97.6 F (36.4 C)  TempSrc: Oral  SpO2: 95%  Weight: 244 lb 3.2 oz (110.8 kg)  Height: 5' 11"  (1.803 m)    Body mass index is 34.06 kg/m.  Physical Exam  Constitutional: Patient appears well-developed and well-nourished. Obese No distress.  HEENT: head atraumatic, normocephalic, pupils equal and reactive to light,  neck supple, throat within normal limits Cardiovascular:  Normal rate, regular rhythm and  normal heart sounds.  No murmur heard. Trace  BLE edema. Pulmonary/Chest: Effort normal and breath sounds normal. No respiratory distress. Abdominal: Soft.  There is no tenderness. Psychiatric: Patient has a normal mood and affect. behavior is normal. Judgment and thought content normal. Muscular Skeletal: well healed scar from surgery, no redness mild increase in warmth. Rom is good on both knees   Recent Results (from the past 2160 hour(s))  POCT HgB A1C     Status: Abnormal   Collection Time: 03/25/18  7:57 AM  Result Value Ref Range   Hemoglobin A1C 5.8 (A) 4.0 - 5.6 %   HbA1c POC (<> result, manual entry)  4.0 - 5.6 %   HbA1c, POC (prediabetic range)  5.7 - 6.4 %   HbA1c, POC (controlled diabetic range)  0.0 - 7.0 %  CBC with Differential/Platelet     Status: Abnormal   Collection Time: 03/25/18  8:34 AM  Result Value Ref Range   WBC 5.6 3.8 - 10.8 Thousand/uL   RBC 4.07 3.80 - 5.10 Million/uL   Hemoglobin 12.4 11.7 - 15.5 g/dL   HCT 37.4 35.0 - 45.0 %   MCV 91.9 80.0 - 100.0 fL   MCH 30.5 27.0 - 33.0 pg   MCHC 33.2 32.0 - 36.0 g/dL   RDW 15.5 (H) 11.0 - 15.0 %   Platelets 162 140 - 400 Thousand/uL   MPV 10.9 7.5 - 12.5 fL   Neutro Abs 3,578 1,500 - 7,800 cells/uL   Lymphs Abs 1,310 850 - 3,900 cells/uL   WBC mixed population 431 200 - 950 cells/uL   Eosinophils Absolute 252 15 - 500 cells/uL   Basophils Absolute 28 0 - 200 cells/uL   Neutrophils Relative % 63.9 %   Total Lymphocyte 23.4 %   Monocytes Relative 7.7 %   Eosinophils Relative 4.5 %   Basophils Relative 0.5 %  COMPLETE METABOLIC PANEL WITH GFR     Status: Abnormal   Collection Time: 03/25/18  8:34 AM  Result Value Ref Range   Glucose, Bld 100 (H) 65 - 99 mg/dL    Comment: .            Fasting reference interval . For someone without known diabetes, a glucose value between 100 and 125 mg/dL is consistent with prediabetes and should be confirmed with a follow-up test. .    BUN 17 7 - 25 mg/dL   Creat  1.01 (H) 0.60 - 0.93 mg/dL    Comment: For patients >60 years of age, the reference limit for Creatinine is approximately 13% higher for people identified as African-American. .    GFR, Est Non African American 56 (L) > OR = 60 mL/min/1.46m   GFR, Est African American 64 > OR = 60 mL/min/1.764m  BUN/Creatinine Ratio 17 6 - 22 (calc)   Sodium 140 135 - 146 mmol/L   Potassium 4.6 3.5 - 5.3 mmol/L   Chloride 105 98 - 110 mmol/L   CO2 28 20 - 32 mmol/L   Calcium 9.4 8.6 - 10.4 mg/dL   Total Protein 7.2 6.1 - 8.1 g/dL   Albumin 3.9 3.6 - 5.1 g/dL   Globulin 3.3 1.9 - 3.7 g/dL (calc)   AG Ratio 1.2 1.0 - 2.5 (calc)   Total Bilirubin 0.4 0.2 - 1.2 mg/dL   Alkaline phosphatase (APISO) 111 33 - 130 U/L   AST 22 10 - 35 U/L   ALT 15 6 - 29 U/L  Diabetic Foot Exam: Diabetic Foot Exam - Simple   Simple Foot Form Diabetic Foot exam was performed with the following findings:  Yes 03/26/2018  1:07 PM  Visual Inspection No deformities, no ulcerations, no other skin breakdown bilaterally:  Yes Sensation Testing Intact to touch and monofilament testing bilaterally:  Yes Pulse Check Posterior Tibialis and Dorsalis pulse intact bilaterally:  Yes Comments      PHQ2/9: Depression screen [facility] 2/9 03/25/2018 06/09/2017 03/10/2017 04/23/2016 11/26/2015  Decreased Interest 0 0 0 0 0  Down, Depressed, Hopeless 0 0 0 0 0  PHQ - 2 Score 0 0 0 0 0  Altered sleeping 0 - - - -  Tired, decreased energy 0 - - - -  Change in appetite 0 - - - -  Feeling bad or failure about yourself  0 - - - -  Trouble concentrating 0 - - - -  Moving slowly or fidgety/restless 0 - - - -  Suicidal thoughts 0 - - - -  PHQ-9 Score 0 - - - -  Difficult doing work/chores Not difficult at all - - - -     Fall Risk: Fall Risk  03/25/2018 09/03/2017 06/09/2017 03/10/2017 11/06/2016  Falls in the past year? No No No Yes No  Number falls in past yr: - - - 1 -  Comment - - - - -  Injury with Fall? - - - No -       Assessment & Plan  1. Type 2 diabetes mellitus with diabetic neuropathy, without long-term current use of insulin (HCC)  - POCT HgB A1C  2. Benign essential HTN  - metoprolol succinate (TOPROL-XL) 100 MG 24 hr tablet; Take 1 tablet (100 mg total) by mouth every evening. Take with or immediately following a meal.  Dispense: 90 tablet; Refill: 1 - COMPLETE METABOLIC PANEL WITH GFR  3. Dyslipidemia  Continue statin therapy   4. Morbid obesity, unspecified obesity type ([place])  Losing weight.   5. Chronic kidney disease (CKD), stage III (moderate) ([place])  Recheck labs   6. Primary osteoarthritis involving multiple joints  Continue follow up with Dr. Rudene Christians  7. Type 2 diabetes mellitus with stage 3 chronic kidney disease, without long-term current use of insulin (HCC)  Stable, recheck labs  8. Overactive bladder  - Ambulatory referral to Urology  9. Anemia, unspecified type  - CBC with Differential/Platelet - Iron, TIBC and Ferritin Panel; Future

## 2018-03-29 ENCOUNTER — Encounter: Payer: Self-pay | Admitting: Family Medicine

## 2018-03-30 DIAGNOSIS — B351 Tinea unguium: Secondary | ICD-10-CM | POA: Diagnosis not present

## 2018-03-30 DIAGNOSIS — M205X1 Other deformities of toe(s) (acquired), right foot: Secondary | ICD-10-CM | POA: Diagnosis not present

## 2018-03-30 DIAGNOSIS — Q6621 Congenital metatarsus primus varus: Secondary | ICD-10-CM | POA: Diagnosis not present

## 2018-03-30 DIAGNOSIS — L600 Ingrowing nail: Secondary | ICD-10-CM | POA: Diagnosis not present

## 2018-03-30 DIAGNOSIS — M79674 Pain in right toe(s): Secondary | ICD-10-CM | POA: Diagnosis not present

## 2018-03-31 DIAGNOSIS — G4733 Obstructive sleep apnea (adult) (pediatric): Secondary | ICD-10-CM | POA: Diagnosis not present

## 2018-04-14 DIAGNOSIS — E7849 Other hyperlipidemia: Secondary | ICD-10-CM | POA: Diagnosis not present

## 2018-04-14 DIAGNOSIS — R0602 Shortness of breath: Secondary | ICD-10-CM | POA: Diagnosis not present

## 2018-04-14 DIAGNOSIS — R600 Localized edema: Secondary | ICD-10-CM | POA: Diagnosis not present

## 2018-04-14 DIAGNOSIS — I10 Essential (primary) hypertension: Secondary | ICD-10-CM | POA: Diagnosis not present

## 2018-04-19 ENCOUNTER — Ambulatory Visit: Payer: BLUE CROSS/BLUE SHIELD | Admitting: Urology

## 2018-04-28 DIAGNOSIS — M79672 Pain in left foot: Secondary | ICD-10-CM | POA: Diagnosis not present

## 2018-04-28 DIAGNOSIS — D2372 Other benign neoplasm of skin of left lower limb, including hip: Secondary | ICD-10-CM | POA: Diagnosis not present

## 2018-04-28 NOTE — Progress Notes (Signed)
04/29/2018 2:19 PM   Becky Trevino September 19, 1945 [id number]  Referring provider: [name], MD [address] [place], [place] [address]  Chief Complaint  Patient presents with  . Over Active Bladder  . Urinary Incontinence    HPI: Patient is a 72 year old African American female who is referred to [place] by Dr. [name] for mixed urinary incontinence.  Patient states that she has had urinary incontinence for several years.   She has urge and stress incontinence.  Well controlled with imipramine.    Her incontinence volume is mild.   She is wearing a panty liner daily.    She is having associated urinary frequency and nocturia.  Patient denies any gross hematuria, dysuria or suprapubic/flank pain.  Patient denies any fevers, chills, nausea or vomiting.     She does not have a history of urinary tract infections, STI's or injury to the bladder.   She does not have a history of nephrolithiasis, GU surgery or GU trauma.   She is post menopausal.  She denies constipation and/or diarrhea.   She is drinking a lot of water daily.   She is drinking an occasional soda, juice and tea.  She is no drinking alcohol.    PMH: Past Medical History:  Diagnosis Date  . Cervical spondylosis   . Chronic kidney disease    Stage 3  . DDD (degenerative disc disease), cervical    [facility]  . Diabetes mellitus without complication ([place])   . Diverticulosis   . Hyperlipidemia   . Hypertension   . Intertrigo   . Leg cramps   . Leg varices   . Obesity   . OSA (obstructive sleep apnea)   . Symptomatic menopausal or female climacteric states     Surgical History: Past Surgical History:  Procedure Laterality Date  . ABDOMINAL HYSTERECTOMY  1993   Total  . BUNIONECTOMY Bilateral 1993  . I&D KNEE WITH POLY EXCHANGE Right 11/19/2017   Procedure: RIGHT KNEE POLY EXCHANGE WITH IRRIGATION AND DEBRIDEMENT;  Surgeon: Hessie Knows, MD;  Location: [facility] ORS;  Service:  Orthopedics;  Laterality: Right;  . JOINT REPLACEMENT     bilateral knee  . LAMINECTOMY  11/14/2013    Cervical Fusion , [facility], Dr. Delilah Shan  . LASER ABLATION Bilateral 07/29/2012   Dr. Lucky Cowboy  . REPLACEMENT TOTAL KNEE Left 03/2010   Dr. Rudene Christians  . TOTAL HIP ARTHROPLASTY Left 12/11/2015   Procedure: TOTAL HIP ARTHROPLASTY ANTERIOR APPROACH;  Surgeon: Hessie Knows, MD;  Location: [facility] ORS;  Service: Orthopedics;  Laterality: Left;  . TOTAL KNEE ARTHROPLASTY Right 03/11/2016   Procedure: TOTAL KNEE ARTHROPLASTY;  Surgeon: Hessie Knows, MD;  Location: [facility] ORS;  Service: Orthopedics;  Laterality: Right;    Home Medications:  Allergies as of 04/29/2018      Reactions   Lisinopril Swelling      Medication List        Accurate as of 04/29/18  2:19 PM. Always use your most recent med list.          acetaminophen 650 MG CR tablet Commonly known as:  TYLENOL Take 650-1,300 mg by mouth 2 (two) times daily as needed for pain.   atorvastatin 40 MG tablet Commonly known as:  LIPITOR Take 1 tablet (40 mg total) by mouth every evening.   CALCIUM 600-D 600-400 MG-UNIT tablet Generic drug:  Calcium Carbonate-Vitamin D Take 1 tablet by mouth 2 (two) times daily.   celecoxib 200 MG capsule Commonly known as:  CELEBREX Take 200 mg by mouth daily as needed for moderate pain.   chlorhexidine 0.12 % solution Commonly known as:  PERIDEX   [employer] LANCING DEVICE Misc 1 each.   doxycycline 100 MG capsule Commonly known as:  VIBRAMYCIN Take 1 capsule by mouth 2 (two) times daily.   FIFTY50 GLUCOSE METER 2.0 w/Device Kit Use as directed.   fluticasone 50 MCG/ACT nasal spray Commonly known as:  FLONASE Place 2 sprays into both nostrils daily.   furosemide 20 MG tablet Commonly known as:  LASIX Take 20 mg by mouth daily.   gabapentin 300 MG capsule Commonly known as:  NEURONTIN Take 1 capsule (300 mg total) by mouth every evening.   glucose blood test strip Use as instructed   imipramine 25 MG  tablet Commonly known as:  TOFRANIL Take 25 mg by mouth every evening.   Insulin Pen Needle 30G X 8 MM Misc Commonly known as:  NOVOFINE Inject one needle daily with Victoza injection.  Dx: E11.40  DM with diabetic neuropathy   ketoconazole 2 % cream Commonly known as:  NIZORAL Apply 1 application topically 2 (two) times daily as needed (rash).   KLOR-CON 10 10 MEQ tablet Generic drug:  potassium chloride Take 10 mEq by mouth daily.   latanoprost 0.005 % ophthalmic solution Commonly known as:  XALATAN Place 1 drop into both eyes at bedtime.   Magnesium Oxide (Antacid) 500 MG Caps Take by mouth.   Magnesium Oxide 500 MG Caps Take 500 mg by mouth daily.   Medical Compression Stockings Misc   metoprolol succinate 100 MG 24 hr tablet Commonly known as:  TOPROL-XL Take 1 tablet (100 mg total) by mouth every evening. Take with or immediately following a meal.   oxyCODONE 5 MG immediate release tablet Commonly known as:  Oxy IR/ROXICODONE Take 1-2 tablets (5-10 mg total) by mouth every 3 (three) hours as needed for severe pain.   rifampin 300 MG capsule Commonly known as:  RIFADIN Take 300 mg by mouth 2 (two) times daily.   traZODone 50 MG tablet Commonly known as:  DESYREL TAKE 1/2 TO 1 TABLET (25-50 MG TOTAL) BY MOUTH AT BEDTIME AS NEEDED FOR SLEEP.       Allergies:  Allergies  Allergen Reactions  . Lisinopril Swelling    Family History: Family History  Problem Relation Age of Onset  . Diabetes Mother   . Hyperlipidemia Mother   . Hypertension Mother   . Diabetes Father   . Hyperlipidemia Father   . Hypertension Father   . Obesity Father   . Hypertension Sister   . Hyperlipidemia Sister   . Hyperlipidemia Sister   . Breast cancer Neg Hx     Social History:  reports that she has never smoked. She has never used smokeless tobacco. She reports that she does not drink alcohol or use drugs.  ROS: UROLOGY Frequent Urination?: Yes Hard to postpone  urination?: No Burning/pain with urination?: No Get up at night to urinate?: Yes Leakage of urine?: Yes Urine stream starts and stops?: No Trouble starting stream?: No Do you have to strain to urinate?: No Blood in urine?: No Urinary tract infection?: No Sexually transmitted disease?: No Injury to kidneys or bladder?: No Painful intercourse?: No Weak stream?: No Currently pregnant?: No Vaginal bleeding?: No Last menstrual period?: Hysterectomy  Gastrointestinal Nausea?: No Vomiting?: No Indigestion/heartburn?: No Diarrhea?: No Constipation?: No  Constitutional Fever: No Night sweats?: No Weight loss?: No Fatigue?: No  Skin Skin rash/lesions?: No Itching?:  No  Eyes Blurred vision?: No Double vision?: No  Ears/Nose/Throat Sore throat?: No Sinus problems?: No  Hematologic/Lymphatic Swollen glands?: No Easy bruising?: No  Cardiovascular Leg swelling?: Yes Chest pain?: No  Respiratory Cough?: No Shortness of breath?: No  Endocrine Excessive thirst?: No  Musculoskeletal Back pain?: No Joint pain?: Yes  Neurological Headaches?: No Dizziness?: No  Psychologic Depression?: No Anxiety?: No  Physical Exam: BP 118/75 (BP Location: Left Arm, Patient Position: Sitting, Cuff Size: Large)   Pulse 82   Ht _0  (1.803 m)   Wt 252 lb 14.4 oz (114.7 kg)   BMI 35.27 kg/m   Constitutional: Well nourished. Alert and oriented, No acute distress. HEENT: [place] AT, moist mucus membranes. Trachea midline, no masses. Cardiovascular: No clubbing, cyanosis, or edema. Respiratory: Normal respiratory effort, no increased work of breathing. Skin: No rashes, bruises or suspicious lesions. Lymph: No cervical or inguinal adenopathy. Neurologic: Grossly intact, no focal deficits, moving all 4 extremities. Psychiatric: Normal mood and affect.  Laboratory Data: Lab Results  Component Value Date   WBC 5.6 03/25/2018   HGB 12.4 03/25/2018   HCT 37.4 03/25/2018   MCV  91.9 03/25/2018   PLT 162 03/25/2018    Lab Results  Component Value Date   CREATININE 1.01 (H) 03/25/2018    No results found for: PSA  No results found for: TESTOSTERONE  Lab Results  Component Value Date   HGBA1C 5.8 (A) 03/25/2018    No results found for: TSH     Component Value Date/Time   CHOL 154 09/03/2017 0828   CHOL 166 11/26/2015 1122   HDL 83 09/03/2017 0828   HDL 81 11/26/2015 1122   CHOLHDL 1.9 09/03/2017 0828   VLDL 9 11/06/2016 0913   LDLCALC 58 09/03/2017 0828    Lab Results  Component Value Date   AST 22 03/25/2018   Lab Results  Component Value Date   ALT 15 03/25/2018   No components found for: ALKALINEPHOPHATASE No components found for: BILIRUBINTOTAL  No results found for: ESTRADIOL  Urinalysis    Component Value Date/Time   COLORURINE YELLOW (A) 11/17/2017 1510   APPEARANCEUR CLOUDY (A) 11/17/2017 1510   LABSPEC 1.019 11/17/2017 1510   PHURINE 6.0 11/17/2017 1510   GLUCOSEU NEGATIVE 11/17/2017 1510   HGBUR SMALL (A) 11/17/2017 1510   BILIRUBINUR NEGATIVE 11/17/2017 1510   KETONESUR NEGATIVE 11/17/2017 1510   PROTEINUR NEGATIVE 11/17/2017 1510   NITRITE NEGATIVE 11/17/2017 1510   LEUKOCYTESUR NEGATIVE 11/17/2017 1510    I have reviewed the labs.   Pertinent Imaging: Results for BRYNNE, DOANE (MRN [id number]) as of 04/29/2018 14:02  Ref. Range 04/29/2018 13:35  Scan Result Unknown 43m   Assessment & Plan:    1. Mixed incontinence Discuss behavioral therapies, bladder training and bladder control strategies Continue the imipramine as she feels that it is helping her reach her goals   Return in about 1 year (around 04/30/2019) for PVR and OAB questionnaire.  These notes generated with voice recognition software. I apologize for typographical errors.  SZara Council PA-C  [facility] [address] Louis StreetSChevy Chase VillageBDefiance [place] [phone]

## 2018-04-29 ENCOUNTER — Ambulatory Visit (INDEPENDENT_AMBULATORY_CARE_PROVIDER_SITE_OTHER): Payer: BLUE CROSS/BLUE SHIELD | Admitting: Urology

## 2018-04-29 ENCOUNTER — Encounter: Payer: Self-pay | Admitting: Urology

## 2018-04-29 VITALS — BP 118/75 | HR 82 | Ht 71.0 in | Wt 252.9 lb

## 2018-04-29 DIAGNOSIS — N3946 Mixed incontinence: Secondary | ICD-10-CM | POA: Diagnosis not present

## 2018-04-29 LAB — BLADDER SCAN AMB NON-IMAGING

## 2018-05-19 ENCOUNTER — Encounter: Payer: Self-pay | Admitting: Emergency Medicine

## 2018-05-19 ENCOUNTER — Emergency Department
Admission: EM | Admit: 2018-05-19 | Discharge: 2018-05-19 | Disposition: A | Discharge status: Home / Self Care | Payer: BLUE CROSS/BLUE SHIELD | Attending: Emergency Medicine | Admitting: Emergency Medicine

## 2018-05-19 ENCOUNTER — Emergency Department: Payer: BLUE CROSS/BLUE SHIELD

## 2018-05-19 DIAGNOSIS — Y9389 Activity, other specified: Secondary | ICD-10-CM | POA: Diagnosis not present

## 2018-05-19 DIAGNOSIS — Z96653 Presence of artificial knee joint, bilateral: Secondary | ICD-10-CM | POA: Insufficient documentation

## 2018-05-19 DIAGNOSIS — S161XXA Strain of muscle, fascia and tendon at neck level, initial encounter: Secondary | ICD-10-CM

## 2018-05-19 DIAGNOSIS — Z79899 Other long term (current) drug therapy: Secondary | ICD-10-CM | POA: Diagnosis not present

## 2018-05-19 DIAGNOSIS — M79602 Pain in left arm: Secondary | ICD-10-CM | POA: Diagnosis not present

## 2018-05-19 DIAGNOSIS — Z794 Long term (current) use of insulin: Secondary | ICD-10-CM | POA: Insufficient documentation

## 2018-05-19 DIAGNOSIS — E119 Type 2 diabetes mellitus without complications: Secondary | ICD-10-CM | POA: Insufficient documentation

## 2018-05-19 DIAGNOSIS — N183 Chronic kidney disease, stage 3 (moderate): Secondary | ICD-10-CM | POA: Diagnosis not present

## 2018-05-19 DIAGNOSIS — I129 Hypertensive chronic kidney disease with stage 1 through stage 4 chronic kidney disease, or unspecified chronic kidney disease: Secondary | ICD-10-CM | POA: Diagnosis not present

## 2018-05-19 DIAGNOSIS — Z96642 Presence of left artificial hip joint: Secondary | ICD-10-CM | POA: Insufficient documentation

## 2018-05-19 DIAGNOSIS — Y999 Unspecified external cause status: Secondary | ICD-10-CM | POA: Insufficient documentation

## 2018-05-19 DIAGNOSIS — Y92410 Unspecified street and highway as the place of occurrence of the external cause: Secondary | ICD-10-CM | POA: Insufficient documentation

## 2018-05-19 DIAGNOSIS — M25561 Pain in right knee: Secondary | ICD-10-CM | POA: Diagnosis not present

## 2018-05-19 DIAGNOSIS — S199XXA Unspecified injury of neck, initial encounter: Secondary | ICD-10-CM | POA: Diagnosis present

## 2018-05-19 MED ORDER — CYCLOBENZAPRINE HCL 5 MG PO TABS: 5.0000 mg | ORAL_TABLET | Freq: Three times a day (TID) | ORAL | 0 refills | Status: DC | PRN

## 2018-05-19 NOTE — Discharge Instructions (Signed)
Your exam and x
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
## 2018-05-19 NOTE — ED Notes (Signed)
Patient reports pain and bruising in left arm and pain in right knee. Ambulatory with steady gait.
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
## 2018-05-20 ENCOUNTER — Other Ambulatory Visit: Payer: Self-pay

## 2018-05-21 ENCOUNTER — Inpatient Hospital Stay: Payer: BLUE CROSS/BLUE SHIELD | Admitting: Family Medicine

## 2018-05-26 DIAGNOSIS — M79672 Pain in left foot: Secondary | ICD-10-CM | POA: Diagnosis not present

## 2018-05-26 DIAGNOSIS — D2372 Other benign neoplasm of skin of left lower limb, including hip: Secondary | ICD-10-CM | POA: Diagnosis not present

## 2018-06-17 ENCOUNTER — Ambulatory Visit: Payer: BLUE CROSS/BLUE SHIELD | Attending: Infectious Diseases | Admitting: Infectious Diseases

## 2018-06-17 ENCOUNTER — Other Ambulatory Visit
Admission: RE | Admit: 2018-06-17 | Discharge: 2018-06-17 | Disposition: A | Discharge status: Home / Self Care | Payer: BLUE CROSS/BLUE SHIELD | Source: Ambulatory Visit | Attending: Infectious Diseases | Admitting: Infectious Diseases

## 2018-06-17 ENCOUNTER — Encounter: Payer: Self-pay | Admitting: Infectious Diseases

## 2018-06-17 VITALS — BP 133/72 | HR 70 | Temp 97.5°F

## 2018-06-17 DIAGNOSIS — Z96642 Presence of left artificial hip joint: Secondary | ICD-10-CM

## 2018-06-17 DIAGNOSIS — B957 Other staphylococcus as the cause of diseases classified elsewhere: Secondary | ICD-10-CM | POA: Diagnosis not present

## 2018-06-17 DIAGNOSIS — Z95828 Presence of other vascular implants and grafts: Secondary | ICD-10-CM

## 2018-06-17 DIAGNOSIS — T8450XA Infection and inflammatory reaction due to unspecified internal joint prosthesis, initial encounter: Secondary | ICD-10-CM | POA: Insufficient documentation

## 2018-06-17 DIAGNOSIS — Z794 Long term (current) use of insulin: Secondary | ICD-10-CM

## 2018-06-17 DIAGNOSIS — M25551 Pain in right hip: Secondary | ICD-10-CM

## 2018-06-17 DIAGNOSIS — Z79899 Other long term (current) drug therapy: Secondary | ICD-10-CM

## 2018-06-17 DIAGNOSIS — Z888 Allergy status to other drugs, medicaments and biological substances status: Secondary | ICD-10-CM

## 2018-06-17 DIAGNOSIS — E785 Hyperlipidemia, unspecified: Secondary | ICD-10-CM

## 2018-06-17 DIAGNOSIS — T8453XD Infection and inflammatory reaction due to internal right knee prosthesis, subsequent encounter: Secondary | ICD-10-CM

## 2018-06-17 DIAGNOSIS — M5030 Other cervical disc degeneration, unspecified cervical region: Secondary | ICD-10-CM

## 2018-06-17 DIAGNOSIS — E119 Type 2 diabetes mellitus without complications: Secondary | ICD-10-CM

## 2018-06-17 DIAGNOSIS — M00061 Staphylococcal arthritis, right knee: Secondary | ICD-10-CM | POA: Diagnosis not present

## 2018-06-17 DIAGNOSIS — I10 Essential (primary) hypertension: Secondary | ICD-10-CM

## 2018-06-17 DIAGNOSIS — Z96653 Presence of artificial knee joint, bilateral: Secondary | ICD-10-CM

## 2018-06-17 LAB — COMPREHENSIVE METABOLIC PANEL
ALK PHOS: 102 U/L (ref 38–126)
ALT: 23 U/L (ref 0–44)
AST: 30 U/L (ref 15–41)
Albumin: 3.6 g/dL (ref 3.5–5.0)
Anion gap: 7 (ref 5–15)
BUN: 18 mg/dL (ref 8–23)
CALCIUM: 9.3 mg/dL (ref 8.9–10.3)
CO2: 28 mmol/L (ref 22–32)
CREATININE: 0.98 mg/dL (ref 0.44–1.00)
Chloride: 106 mmol/L (ref 98–111)
GFR, EST NON AFRICAN AMERICAN: 56 mL/min — AB (ref >60)
Glucose, Bld: 98 mg/dL (ref 70–99)
Potassium: 4.5 mmol/L (ref 3.5–5.1)
Sodium: 141 mmol/L (ref 135–145)
TOTAL PROTEIN: 7.8 g/dL (ref 6.5–8.1)
Total Bilirubin: 1 mg/dL (ref 0.3–1.2)

## 2018-06-17 LAB — CBC WITH DIFFERENTIAL/PLATELET
Basophils Absolute: 0 10*3/uL (ref 0–0.1)
Basophils Relative: 1 %
EOS ABS: 0.2 10*3/uL (ref 0–0.7)
Eosinophils Relative: 3 %
HEMATOCRIT: 39.1 % (ref 35.0–47.0)
HEMOGLOBIN: 13.2 g/dL (ref 12.0–16.0)
Lymphocytes Relative: 23 %
Lymphs Abs: 1.6 10*3/uL (ref 1.0–3.6)
MCH: 32.8 pg (ref 26.0–34.0)
MCHC: 33.9 g/dL (ref 32.0–36.0)
MCV: 96.7 fL (ref 80.0–100.0)
MONOS PCT: 8 %
Monocytes Absolute: 0.5 10*3/uL (ref 0.2–0.9)
NEUTROS PCT: 65 %
Neutro Abs: 4.4 10*3/uL (ref 1.4–6.5)
Platelets: 178 10*3/uL (ref 150–440)
RBC: 4.04 MIL/uL (ref 3.80–5.20)
RDW: 16.5 % — ABNORMAL HIGH (ref 11.5–14.5)
WBC: 6.8 10*3/uL (ref 3.6–11.0)

## 2018-06-17 LAB — SEDIMENTATION RATE: Sed Rate: 41 mm/hr — ABNORMAL HIGH (ref 0–30)

## 2018-06-17 LAB — C-REACTIVE PROTEIN: CRP: <0.8 mg/dL (ref <1.0)

## 2018-06-17 MED ORDER — DOXYCYCLINE HYCLATE 100 MG PO TBEC: 100.0000 mg | DELAYED_RELEASE_TABLET | Freq: Two times a day (BID) | ORAL | 3 refills | Status: DC

## 2018-06-17 NOTE — Patient Instructions (Signed)
You are here to se eme for rt knee joint infection of the prosthetic knee which you sustained in March 2019. You were seeing Dr.Fitzgerald before- you finished IV 6 weeks and now you are taking Po doxycycline and PO rifampin. The rifampin can be stopped now. Continue Doxycycline 100mg  Po BID as suppressive therapy for along time as you have retained hardware. Today we will do Some blood work to see how the onflammation in your rt knee is.

## 2018-06-17 NOTE — Progress Notes (Signed)
NAME: Becky Trevino  DOB: [birth year]  MRN: [id number]  Date/Time: 06/17/2018 10:34 AM Subjective:   ? Becky Trevino is a 72 y.o. with a right prosthetic knee infection, diabetes mellitus, hyperlipidemia, hypertension, degenerative disc disease is here for follow-up of her right PJI.  Patient used to be followed by Dr. Ola Spurr who is no longer in the country . On reviewing [facility] clinic records it shows that patient had right knee replacement in 2017.  In February 2019 she had swelling of the knee and an aspiration revealed 59,000 WBCs with 98% polymorphs.  Cultures were negative as well as for crystal analysis.  Sed rate was 76.  On March 7 she had an incision and drainage and polyethylene exchange of the right knee.  The cultures grew coag negative staph.  PICC line was placed and she was given vancomycin and ceftriaxone for 6 weeks.  After which she was started on oral doxycycline and rifampin.  As the hardware, prosthesis was not being removed the plan was to continue on suppressive antibiotic therapy  Indefinitely.  She is here to see me and establish care for that. She has been doing well.  She has not had any swelling of the knee.  She is able to ambulate better. She has not had any side effects from her antibiotics. She has no fever or chills.  She is followed by Dr. Rudene Christians. Past Medical History:  Diagnosis Date  . Cervical spondylosis   . Chronic kidney disease    Stage 3  . DDD (degenerative disc disease), cervical    [facility]  . Diabetes mellitus without complication ([place])   . Diverticulosis   . Hyperlipidemia   . Hypertension   . Intertrigo   . Leg cramps   . Leg varices   . Obesity   . OSA (obstructive sleep apnea)   . Symptomatic menopausal or female climacteric states     Past Surgical History:  Procedure Laterality Date  . ABDOMINAL HYSTERECTOMY  1993   Total  . BUNIONECTOMY Bilateral 1993  . I&D KNEE WITH POLY EXCHANGE Right 11/19/2017   Procedure: RIGHT  KNEE POLY EXCHANGE WITH IRRIGATION AND DEBRIDEMENT;  Surgeon: Hessie Knows, MD;  Location: [facility] ORS;  Service: Orthopedics;  Laterality: Right;  . JOINT REPLACEMENT     bilateral knee  . LAMINECTOMY  11/14/2013    Cervical Fusion , [facility], Dr. Delilah Shan  . LASER ABLATION Bilateral 07/29/2012   Dr. Lucky Cowboy  . REPLACEMENT TOTAL KNEE Left 03/2010   Dr. Rudene Christians  . TOTAL HIP ARTHROPLASTY Left 12/11/2015   Procedure: TOTAL HIP ARTHROPLASTY ANTERIOR APPROACH;  Surgeon: Hessie Knows, MD;  Location: [facility] ORS;  Service: Orthopedics;  Laterality: Left;  . TOTAL KNEE ARTHROPLASTY Right 03/11/2016   Procedure: TOTAL KNEE ARTHROPLASTY;  Surgeon: Hessie Knows, MD;  Location: [facility] ORS;  Service: Orthopedics;  Laterality: Right;    Social history Lives with her husband Work at [employer] No alcohol  Family History  Problem Relation Age of Onset  . Diabetes Mother   . Hyperlipidemia Mother   . Hypertension Mother   . Diabetes Father   . Hyperlipidemia Father   . Hypertension Father   . Obesity Father   . Hypertension Sister   . Hyperlipidemia Sister   . Hyperlipidemia Sister   . Breast cancer Neg Hx    Allergies  Allergen Reactions  . Lisinopril Swelling   ? Current Outpatient Medications  Medication Sig Dispense Refill  . acetaminophen (TYLENOL) 650 MG CR tablet  Take 650-1,300 mg by mouth 2 (two) times daily as needed for pain.    Marland Kitchen atorvastatin (LIPITOR) 40 MG tablet Take 1 tablet (40 mg total) by mouth every evening. 90 tablet 1  . Blood Glucose Monitoring Suppl (FIFTY50 GLUCOSE METER 2.0) w/Device KIT Use as directed.    . Calcium Carbonate-Vitamin D (CALCIUM 600-D) 600-400 MG-UNIT per tablet Take 1 tablet by mouth 2 (two) times daily.    . celecoxib (CELEBREX) 200 MG capsule Take 200 mg by mouth daily as needed for moderate pain.   11  . chlorhexidine (PERIDEX) 0.12 % solution     . cyclobenzaprine (FLEXERIL) 5 MG tablet Take 1 tablet (5 mg total) by mouth 3 (three) times daily as needed  for muscle spasms. 15 tablet 0  . doxycycline (DORYX) 100 MG EC tablet Take 100 mg by mouth 2 (two) times daily.    Regino Schultze Bandages & Supports (MEDICAL COMPRESSION STOCKINGS) MISC     . fluticasone (FLONASE) 50 MCG/ACT nasal spray Place 2 sprays into both nostrils daily. (Patient taking differently: Place 2 sprays into both nostrils daily as needed for allergies. ) 16 g 2  . gabapentin (NEURONTIN) 300 MG capsule Take 1 capsule (300 mg total) by mouth every evening. 90 capsule 2  . glucose blood (ONE TOUCH ULTRA TEST) test strip Use as instructed 100 each 12  . Insulin Pen Needle (NOVOFINE AUTOCOVER) 30G X 8 MM MISC Inject one needle daily with Victoza injection.  Dx: E11.40  DM with diabetic neuropathy 100 each 1  . ketoconazole (NIZORAL) 2 % cream Apply 1 application topically 2 (two) times daily as needed (rash).     Marland Kitchen KLOR-CON 10 10 MEQ tablet Take 10 mEq by mouth daily.   3  . Lancet Devices ([employer] LANCING DEVICE) MISC 1 each.    . latanoprost (XALATAN) 0.005 % ophthalmic solution Place 1 drop into both eyes at bedtime.     . Magnesium Oxide 500 MG CAPS Take 500 mg by mouth daily.     . Magnesium Oxide, Antacid, 500 MG CAPS Take by mouth.    . metoprolol succinate (TOPROL-XL) 100 MG 24 hr tablet Take 1 tablet (100 mg total) by mouth every evening. Take with or immediately following a meal. 90 tablet 1  . oxyCODONE (OXY IR/ROXICODONE) 5 MG immediate release tablet Take 1-2 tablets (5-10 mg total) by mouth every 3 (three) hours as needed for severe pain. 60 tablet 0  . rifampin (RIFADIN) 300 MG capsule Take 300 mg by mouth 2 (two) times daily.  5  . traZODone (DESYREL) 50 MG tablet TAKE 1/2 TO 1 TABLET (25-50 MG TOTAL) BY MOUTH AT BEDTIME AS NEEDED FOR SLEEP. (Patient taking differently: Take 50 mg by mouth at bedtime as needed for sleep) 30 tablet 5  . furosemide (LASIX) 20 MG tablet Take 20 mg by mouth daily.  6  . imipramine (TOFRANIL) 25 MG tablet Take 25 mg by mouth every evening.       No current facility-administered medications for this visit.     REVIEW OF SYSTEMS:  Const: negative fever, negative chills, negative weight loss Eyes: negative diplopia or visual changes, negative eye pain ENT: negative coryza, negative sore throat Resp: negative cough, hemoptysis, dyspnea Cards: negative for chest pain, palpitations, lower extremity edema GU: negative for frequency, dysuria and hematuria GI: Negative for abdominal pain, diarrhea, bleeding, constipation Skin: negative for rash and pruritus Heme: negative for easy bruising and gum/nose bleeding MS: Has pain in her  right hip Neurolo:negative for headaches, dizziness, vertigo, memory problems  Psych: negative for feelings of anxiety, depression  Endocrine: No polyuria or polydipsia Allergy/Immunology-anchor speech focus allergic to lisinopril ?  Objective:  VITALS:  BP 133/72 (BP Location: Left Arm, Patient Position: Sitting, Cuff Size: Large)   Pulse 70   Temp (!) 97.5 F (36.4 C) (Oral)  PHYSICAL EXAM:  General: Alert, cooperative, no distress, appears stated age.  Head: Normocephalic, without obvious abnormality, atraumatic. Eyes: Conjunctivae clear, anicteric sclerae. Pupils are equal ENT Nares normal. No drainage or sinus tenderness. Lips, mucosa, and tongue normal. No Thrush Neck: Supple, symmetrical, no adenopathy, thyroid: non tender no carotid bruit and no JVD. Back: No CVA tenderness. Lungs: Clear to auscultation bilaterally. No Wheezing or Rhonchi. No rales. Heart: Regular rate and rhythm, no murmur, rub or gallop. Abdomen: Soft, non-tender,not distended. Bowel sounds normal. No masses Extremities: Bilateral knee scars Minimal edema ankles  skin: No rashes or lesions. Or bruising Lymph: Cervical, supraclavicular normal. Neurologic: Grossly non-focal Pertinent Labs None available IMAGING RESULTS: NA? Impression/Recommendation 72 y.o. with a right prosthetic knee infection, diabetes mellitus,  hyperlipidemia, hypertension, degenerative disc disease is here for follow-up of her right PJI.     Right knee prosthetic joint infection.  Has been on antibiotics since March 2019.  Currently on oral doxycycline/ rifampin which she will be completing soon.  Her previous ID physician's plan was to leave her  on indefinite suppressive antibiotic therapy as the prosthesis in the right knee is not going to be removed. We will stop rifampin now and asked her to continue doxycycline 100 mg p.o. twice daily for another 3 months.  Will need to weigh the pros and cons of indefinite antibiotic therapy.  We will do ESR, CRP and other labs today.  Will discuss with Dr. Rudene Christians as well.    History of left THA and TKA.  History of cervical spine surgery  Diabetes mellitus on insulin  Hypertension on metoprolol ? ?  Discussed with patient in great detail.  Will see her in a few months time

## 2018-06-21 DIAGNOSIS — Q66211 Congenital metatarsus primus varus, right foot: Secondary | ICD-10-CM | POA: Diagnosis not present

## 2018-06-21 DIAGNOSIS — M79674 Pain in right toe(s): Secondary | ICD-10-CM | POA: Diagnosis not present

## 2018-06-21 DIAGNOSIS — M898X9 Other specified disorders of bone, unspecified site: Secondary | ICD-10-CM | POA: Diagnosis not present

## 2018-07-06 ENCOUNTER — Other Ambulatory Visit: Payer: Self-pay | Admitting: Family Medicine

## 2018-07-06 DIAGNOSIS — E785 Hyperlipidemia, unspecified: Secondary | ICD-10-CM

## 2018-07-19 DIAGNOSIS — Q66211 Congenital metatarsus primus varus, right foot: Secondary | ICD-10-CM | POA: Diagnosis not present

## 2018-07-19 DIAGNOSIS — M79674 Pain in right toe(s): Secondary | ICD-10-CM | POA: Diagnosis not present

## 2018-07-19 DIAGNOSIS — M898X9 Other specified disorders of bone, unspecified site: Secondary | ICD-10-CM | POA: Diagnosis not present

## 2018-07-27 ENCOUNTER — Ambulatory Visit: Payer: BLUE CROSS/BLUE SHIELD | Admitting: Family Medicine

## 2018-07-27 ENCOUNTER — Ambulatory Visit (INDEPENDENT_AMBULATORY_CARE_PROVIDER_SITE_OTHER): Payer: BLUE CROSS/BLUE SHIELD | Admitting: Family Medicine

## 2018-07-27 ENCOUNTER — Encounter: Payer: Self-pay | Admitting: Family Medicine

## 2018-07-27 VITALS — BP 142/82 | HR 83 | Temp 98.1°F | Resp 16 | Ht 71.0 in | Wt 256.4 lb

## 2018-07-27 DIAGNOSIS — G4733 Obstructive sleep apnea (adult) (pediatric): Secondary | ICD-10-CM

## 2018-07-27 DIAGNOSIS — E1140 Type 2 diabetes mellitus with diabetic neuropathy, unspecified: Secondary | ICD-10-CM

## 2018-07-27 DIAGNOSIS — M159 Polyosteoarthritis, unspecified: Secondary | ICD-10-CM

## 2018-07-27 DIAGNOSIS — M009 Pyogenic arthritis, unspecified: Secondary | ICD-10-CM

## 2018-07-27 DIAGNOSIS — M5030 Other cervical disc degeneration, unspecified cervical region: Secondary | ICD-10-CM

## 2018-07-27 DIAGNOSIS — E785 Hyperlipidemia, unspecified: Secondary | ICD-10-CM

## 2018-07-27 DIAGNOSIS — I10 Essential (primary) hypertension: Secondary | ICD-10-CM

## 2018-07-27 DIAGNOSIS — M150 Primary generalized (osteo)arthritis: Secondary | ICD-10-CM

## 2018-07-27 DIAGNOSIS — G4700 Insomnia, unspecified: Secondary | ICD-10-CM

## 2018-07-27 LAB — POCT GLYCOSYLATED HEMOGLOBIN (HGB A1C): HbA1c, POC (controlled diabetic range): 5.7 % (ref 0.0–7.0)

## 2018-07-27 MED ORDER — TRAZODONE HCL 50 MG PO TABS: ORAL_TABLET | ORAL | 0 refills | Status: DC

## 2018-07-27 MED ORDER — GABAPENTIN 300 MG PO CAPS: 300.0000 mg | ORAL_CAPSULE | Freq: Every evening | ORAL | 2 refills | Status: DC

## 2018-07-27 MED ORDER — METOPROLOL SUCCINATE ER 100 MG PO TB24: 100.0000 mg | ORAL_TABLET | Freq: Every evening | ORAL | 1 refills | Status: DC

## 2018-07-27 MED ORDER — ATORVASTATIN CALCIUM 40 MG PO TABS: 40.0000 mg | ORAL_TABLET | Freq: Every day | ORAL | 1 refills | Status: DC

## 2018-07-27 NOTE — Progress Notes (Signed)
Name: Becky Trevino   MRN: [id number]    DOB: Sep 21, 1945   Date:07/27/2018       Progress Note  Subjective  Chief Complaint  Chief Complaint  Patient presents with  . Medication Refill  . Diabetes    Lowest-70's Average-129  . Hypertension    Edema but stockings help wth symptoms  . Hyperlipidemia  . Insomnia    Takes medication prn-sleeping 5 to 8 hours with medication  . Sleep Apnea  . Osteoarthritis    HPI  DMII with renal manifestation and neuropathy: fsbs 70's-129 HbgA1C is still at goal.- today 5.7%Urine micro was back to normal, not on ARB because of angiodema. Doing well, no polyphagia or polydipsia, she used to have urinary frequency but controlled with medication given by Urologist. . She sees Podiatrist she has corns and neuropathy, last exam was 10/2017, she is on gabapentin. On life style modification only   Obesity Morbid: she is doing better since last visit 08/2017. Her weight was 262 lb, but since revision of right knee replacement March 2019 she gained a few pounds since last visit but is still doing well, weight today was 256 lbs. . She has DM, insulin resistance,she is off Victoza, she will try not to snack at work on junk food. Advised to pack healthy snacks.   HTN:bp is at goal today, she has been takingMetoprolol , no dizziness, no chest pain or palpitation   Hyperlipidemia: taking Atorvastatin daily and denies side effects. No chest pain , no muscles pain.Unchanged   Insomnia: She is wearing CPAP and is doing well, taking trazodone very seldom.  Cervical DDD: s/p neck fusion in March 2015, she is doing better, still has poor rom of neck, taking gabapentinevery night. She was [facility]  OSA: she uses CPAP every night, for most of the night and sometimes all night.Tolerates it well, unchanged .   OA: multi joints, sees Ortho, Dr. Rudene Christians. Both kneestaking Tylenol and stable at this time. She had revision of right knee  replacement March 2019 because of infection. Still under the care of Ortho and also ID, she is on one antibiotic now, she denies redness or pain on right knee. Last CRP was normal and sed rate trending down, normal CBC and comp panel done last month.   Patient Active Problem List   Diagnosis Date Noted  . Infection and inflammatory reaction due to internal joint prosthesis, subsequent encounter 01/25/2018  . Infection of total right knee replacement ([place]) 11/19/2017  . Type 2 diabetes mellitus with diabetic neuropathy, without long-term current use of insulin ([place]) 11/06/2016  . Primary osteoarthritis of knee 03/11/2016  . Primary osteoarthritis of left hip 12/11/2015  . Benign essential HTN 04/21/2015  . Edema leg 04/21/2015  . Chronic kidney disease (CKD), stage III (moderate) ([place]) 04/21/2015  . Chronic venous insufficiency 04/21/2015  . DDD (degenerative disc disease), cervical 04/21/2015  . Diabetes mellitus with renal manifestation ([place]) 04/21/2015  . Dyslipidemia 04/21/2015  . Bladder cystocele 04/21/2015  . Urinary incontinence 04/21/2015  . Leg varices 04/21/2015  . Insomnia 04/21/2015  . Eczema intertrigo 04/21/2015  . Obesity, morbid ([place]) 04/21/2015  . Obstructive apnea 04/21/2015  . Menopausal and perimenopausal disorder 04/21/2015  . Supraventricular premature beats 04/21/2015  . Osteoarthritis of multiple joints 04/21/2015  . H/O Spinal surgery 11/14/2013  . Detrusor muscle hypertonia 11/07/2013    Past Surgical History:  Procedure Laterality Date  . ABDOMINAL HYSTERECTOMY  1993   Total  .  BUNIONECTOMY Bilateral 1993  . I&D KNEE WITH POLY EXCHANGE Right 11/19/2017   Procedure: RIGHT KNEE POLY EXCHANGE WITH IRRIGATION AND DEBRIDEMENT;  Surgeon: Hessie Knows, MD;  Location: [facility] ORS;  Service: Orthopedics;  Laterality: Right;  . JOINT REPLACEMENT     bilateral knee  . LAMINECTOMY  11/14/2013    Cervical Fusion , [facility], Dr. Delilah Shan  . LASER ABLATION Bilateral  07/29/2012   Dr. Lucky Cowboy  . REPLACEMENT TOTAL KNEE Left 03/2010   Dr. Rudene Christians  . TOTAL HIP ARTHROPLASTY Left 12/11/2015   Procedure: TOTAL HIP ARTHROPLASTY ANTERIOR APPROACH;  Surgeon: Hessie Knows, MD;  Location: [facility] ORS;  Service: Orthopedics;  Laterality: Left;  . TOTAL KNEE ARTHROPLASTY Right 03/11/2016   Procedure: TOTAL KNEE ARTHROPLASTY;  Surgeon: Hessie Knows, MD;  Location: [facility] ORS;  Service: Orthopedics;  Laterality: Right;    Family History  Problem Relation Age of Onset  . Diabetes Mother   . Hyperlipidemia Mother   . Hypertension Mother   . Diabetes Father   . Hyperlipidemia Father   . Hypertension Father   . Obesity Father   . Hypertension Sister   . Hyperlipidemia Sister   . Hyperlipidemia Sister   . Breast cancer Neg Hx     Social History   Socioeconomic History  . Marital status: Married    Spouse name: Not on file  . Number of children: Not on file  . Years of education: Not on file  . Highest education level: Not on file  Occupational History  . Not on file  Social Needs  . Financial resource strain: Not on file  . Food insecurity:    Worry: Not on file    Inability: Not on file  . Transportation needs:    Medical: Not on file    Non-medical: Not on file  Tobacco Use  . Smoking status: Never Smoker  . Smokeless tobacco: Never Used  Substance and Sexual Activity  . Alcohol use: No    Alcohol/week: 0.0 standard drinks  . Drug use: No  . Sexual activity: Yes    Partners: Male  Lifestyle  . Physical activity:    Days per week: Not on file    Minutes per session: Not on file  . Stress: Not on file  Relationships  . Social connections:    Talks on phone: Not on file    Gets together: Not on file    Attends religious service: Not on file    Active member of club or organization: Not on file    Attends meetings of clubs or organizations: Not on file    Relationship status: Not on file  . Intimate partner violence:    Fear of current or ex partner:  Not on file    Emotionally abused: Not on file    Physically abused: Not on file    Forced sexual activity: Not on file  Other Topics Concern  . Not on file  Social History Narrative  . Not on file     Current Outpatient Medications:  .  acetaminophen (TYLENOL) 650 MG CR tablet, Take 650-1,300 mg by mouth 2 (two) times daily as needed for pain., Disp: , Rfl:  .  atorvastatin (LIPITOR) 40 MG tablet, TAKE 1 TABLET BY MOUTH EVERY DAY IN THE EVENING, Disp: 90 tablet, Rfl: 0 .  Blood Glucose Monitoring Suppl (FIFTY50 GLUCOSE METER 2.0) w/Device KIT, Use as directed., Disp: , Rfl:  .  Calcium Carbonate-Vitamin D (CALCIUM 600-D) 600-400 MG-UNIT per tablet, Take 1  tablet by mouth 2 (two) times daily., Disp: , Rfl:  .  celecoxib (CELEBREX) 200 MG capsule, Take 200 mg by mouth daily as needed for moderate pain. , Disp: , Rfl: 11 .  cyclobenzaprine (FLEXERIL) 5 MG tablet, Take 1 tablet (5 mg total) by mouth 3 (three) times daily as needed for muscle spasms., Disp: 15 tablet, Rfl: 0 .  doxycycline (DORYX) 100 MG EC tablet, Take 1 tablet (100 mg total) by mouth 2 (two) times daily., Disp: 60 tablet, Rfl: 3 .  Elastic Bandages & Supports (MEDICAL COMPRESSION STOCKINGS) MISC, , Disp: , Rfl:  .  fluticasone (FLONASE) 50 MCG/ACT nasal spray, Place 2 sprays into both nostrils daily. (Patient taking differently: Place 2 sprays into both nostrils daily as needed for allergies. ), Disp: 16 g, Rfl: 2 .  furosemide (LASIX) 20 MG tablet, Take 20 mg by mouth daily., Disp: , Rfl: 6 .  gabapentin (NEURONTIN) 300 MG capsule, Take 1 capsule (300 mg total) by mouth every evening., Disp: 90 capsule, Rfl: 2 .  glucose blood (ONE TOUCH ULTRA TEST) test strip, Use as instructed, Disp: 100 each, Rfl: 12 .  imipramine (TOFRANIL) 25 MG tablet, Take 25 mg by mouth every evening. , Disp: , Rfl:  .  ketoconazole (NIZORAL) 2 % cream, Apply 1 application topically 2 (two) times daily as needed (rash). , Disp: , Rfl:  .  KLOR-CON 10  10 MEQ tablet, Take 10 mEq by mouth daily. , Disp: , Rfl: 3 .  Lancet Devices ([employer] LANCING DEVICE) MISC, 1 each., Disp: , Rfl:  .  latanoprost (XALATAN) 0.005 % ophthalmic solution, Place 1 drop into both eyes at bedtime. , Disp: , Rfl:  .  Magnesium Oxide 500 MG CAPS, Take 500 mg by mouth daily. , Disp: , Rfl:  .  Magnesium Oxide, Antacid, 500 MG CAPS, Take by mouth., Disp: , Rfl:  .  metoprolol succinate (TOPROL-XL) 100 MG 24 hr tablet, Take 1 tablet (100 mg total) by mouth every evening. Take with or immediately following a meal., Disp: 90 tablet, Rfl: 1 .  oxyCODONE (OXY IR/ROXICODONE) 5 MG immediate release tablet, Take 1-2 tablets (5-10 mg total) by mouth every 3 (three) hours as needed for severe pain., Disp: 60 tablet, Rfl: 0 .  traZODone (DESYREL) 50 MG tablet, TAKE 1/2 TO 1 TABLET (25-50 MG TOTAL) BY MOUTH AT BEDTIME AS NEEDED FOR SLEEP. (Patient taking differently: Take 50 mg by mouth at bedtime as needed for sleep), Disp: 30 tablet, Rfl: 5 .  chlorhexidine (PERIDEX) 0.12 % solution, , Disp: , Rfl:  .  rifampin (RIFADIN) 300 MG capsule, Take 300 mg by mouth 2 (two) times daily., Disp: , Rfl: 5  Allergies  Allergen Reactions  . Lisinopril Swelling    I personally reviewed active problem list, medication list, allergies, family history, social history with the patient/caregiver today.   ROS  Constitutional: Negative for fever or weight change.  Respiratory: Negative for cough and shortness of breath.   Cardiovascular: Negative for chest pain or palpitations.  Gastrointestinal: Negative for abdominal pain, no bowel changes.  Musculoskeletal: Negative for gait problem or joint swelling.  Skin: Negative for rash.  Neurological: Negative for dizziness or headache.  No other specific complaints in a complete review of systems (except as listed in HPI above).  Objective  Vitals:   07/27/18 0838  BP: (!) 152/88  Pulse: 83  Resp: 16  Temp: 98.1 F (36.7 C)  TempSrc: Oral   SpO2: 97%  Weight:  256 lb 6.4 oz (116.3 kg)  Height: 5' 11"  (1.803 m)    Body mass index is 35.76 kg/m.  Physical Exam  Constitutional: Patient appears well-developed and well-nourished. Obese No distress.  HEENT: head atraumatic, normocephalic, pupils equal and reactive to light, neck supple, throat within normal limits Cardiovascular: Normal rate, regular rhythm and normal heart sounds.  No murmur heard. No BLE edema. Pulmonary/Chest: Effort normal and breath sounds normal. No respiratory distress. Abdominal: Soft.  There is no tenderness. Muscular Skeletal: no crepitus with extension of both knees.  Psychiatric: Patient has a normal mood and affect. behavior is normal. Judgment and thought content normal.  Recent Results (from the past 2160 hour(s))  Bladder Scan (Post Void Residual) in office     Status: None   Collection Time: 04/29/18  1:35 PM  Result Value Ref Range   Scan Result 28m   C-reactive protein     Status: None   Collection Time: 06/17/18 11:19 AM  Result Value Ref Range   CRP <0.8 <1.0 mg/dL    Comment: Performed at [facility] [address] [place], [place] [address] Sedimentation rate     Status: Abnormal   Collection Time: 06/17/18 11:19 AM  Result Value Ref Range   Sed Rate 41 (H) 0 - 30 mm/hr    Comment: [facility] [address] [place] [id number] Comp Met (CMET)     Status: Abnormal   Collection Time: 06/17/18 11:19 AM  Result Value Ref Range   Sodium 141 135 - 145 mmol/L   Potassium 4.5 3.5 - 5.1 mmol/L   Chloride 106 98 - 111 mmol/L   CO2 28 22 - 32 mmol/L   Glucose, Bld 98 70 - 99 mg/dL   BUN 18 8 - 23 mg/dL   Creatinine, Ser 0.98 0.44 - 1.00 mg/dL   Calcium 9.3 8.9 - 10.3 mg/dL   Total Protein 7.8 6.5 - 8.1 g/dL   Albumin 3.6 3.5 - 5.0 g/dL   AST 30 15 - 41 U/L   ALT 23 0 - 44 U/L   Alkaline Phosphatase 102 38 - 126 U/L   Total Bilirubin 1.0 0.3 - 1.2 mg/dL   GFR calc non Af Amer 56 (L)  >60 mL/min   GFR calc Af Amer >60 >60 mL/min    Comment: (NOTE) The eGFR has been calculated using the CKD EPI equation. This calculation has not been validated in all clinical situations. eGFR's persistently <60 mL/min signify possible Chronic Kidney Disease.    Anion gap 7 5 - 15    Comment: [facility]formed at [facility] [address], [place]  [id number] CBC w/Diff     Status: Abnormal   Collection Time: 06/17/18 11:19 AM  Result Value Ref Range   WBC 6.8 3.6 - 11.0 K/uL   RBC 4.04 3.80 - 5.20 MIL/uL   Hemoglobin 13.2 12.0 - 16.0 g/dL   HCT 39.1 35.0 - 47.0 %   MCV 96.7 80.0 - 100.0 fL   MCH 32.8 26.0 - 34.0 pg   MCHC 33.9 32.0 - 36.0 g/dL   RDW 16.5 (H) 11.5 - 14.5 %   Platelets 178 150 - 440 K/uL   Neutrophils Relative % 65 %   Neutro Abs 4.4 1.4 - 6.5 K/uL   Lymphocytes Relative 23 %   Lymphs Abs 1.6 1.0 - 3.6 K/uL   Monocytes Relative 8 %   Monocytes Absolute 0.5 0.2 - 0.9  K/uL   Eosinophils Relative 3 %   Eosinophils Absolute 0.2 0 - 0.7 K/uL   Basophils Relative 1 %   Basophils Absolute 0.0 0 - 0.1 K/uL    Comment: Performed at [facility], [address] [place], [place] [address]  POCT HgB A1C     Status: Normal   Collection Time: 07/27/18  8:43 AM  Result Value Ref Range   Hemoglobin A1C     HbA1c POC (<> result, manual entry)     HbA1c, POC (prediabetic range)     HbA1c, POC (controlled diabetic range) 5.7 0.0 - 7.0 %      PHQ2/9: Depression screen [facility] 2/9 07/27/2018 03/25/2018 06/09/2017 03/10/2017 04/23/2016  Decreased Interest 0 0 0 0 0  Down, Depressed, Hopeless 0 0 0 0 0  PHQ - 2 Score 0 0 0 0 0  Altered sleeping 0 0 - - -  Tired, decreased energy 0 0 - - -  Change in appetite 1 0 - - -  Feeling bad or failure about yourself  0 0 - - -  Trouble concentrating 1 0 - - -  Moving slowly or fidgety/restless 0 0 - - -  Suicidal thoughts 0 0 - - -  PHQ-9 Score 2 0 - - -  Difficult doing work/chores Not difficult at all Not  difficult at all - - -     Fall Risk: Fall Risk  07/27/2018 03/25/2018 09/03/2017 06/09/2017 03/10/2017  Falls in the past year? 0 No No No Yes  Number falls in past yr: 0 - - - 1  Comment - - - - -  Injury with Fall? 0 - - - No     Functional Status Survey: Is the patient deaf or have difficulty hearing?: No Does the patient have difficulty seeing, even when wearing glasses/contacts?: Yes Does the patient have difficulty concentrating, remembering, or making decisions?: No Does the patient have difficulty walking or climbing stairs?: No Does the patient have difficulty dressing or bathing?: No Does the patient have difficulty doing errands alone such as visiting a doctor's office or shopping?: No    Assessment & Plan  1. Type 2 diabetes mellitus with diabetic neuropathy, without long-term current use of insulin (HCC)  - POCT HgB A1C - Urine Microalbumin w/creat. ratio  2. Dyslipidemia  - atorvastatin (LIPITOR) 40 MG tablet; Take 1 tablet (40 mg total) by mouth daily at 6 PM.  Dispense: 90 tablet; Refill: 1  3. Benign essential HTN  - metoprolol succinate (TOPROL-XL) 100 MG 24 hr tablet; Take 1 tablet (100 mg total) by mouth every evening. Take with or immediately following a meal.  Dispense: 90 tablet; Refill: 1  4. Insomnia  - traZODone (DESYREL) 50 MG tablet; Take 50 mg by mouth at bedtime as needed for sleep  Dispense: 30 tablet; Refill: 0  5. Morbid obesity, unspecified obesity type ([place])  BMI above 35 with co-morbidities   6. DDD (degenerative disc disease), cervical  - gabapentin (NEURONTIN) 300 MG capsule; Take 1 capsule (300 mg total) by mouth every evening.  Dispense: 90 capsule; Refill: 2  7. Obstructive apnea   8. Primary osteoarthritis involving multiple joints  Under the care of Dr. Rudene Christians  9. Chronic infection of right knee (HCC)  Under the care of ID and Ortho

## 2018-07-28 LAB — MICROALBUMIN / CREATININE URINE RATIO
CREATININE, URINE: 111 mg/dL (ref 20–275)
MICROALB/CREAT RATIO: 3 ug/mg{creat} (ref <30)
Microalb, Ur: 0.3 mg/dL

## 2018-08-16 DIAGNOSIS — Q66211 Congenital metatarsus primus varus, right foot: Secondary | ICD-10-CM | POA: Diagnosis not present

## 2018-08-16 DIAGNOSIS — M79674 Pain in right toe(s): Secondary | ICD-10-CM | POA: Diagnosis not present

## 2018-08-16 DIAGNOSIS — M7121 Synovial cyst of popliteal space [Baker], right knee: Secondary | ICD-10-CM | POA: Diagnosis not present

## 2018-08-16 DIAGNOSIS — M898X9 Other specified disorders of bone, unspecified site: Secondary | ICD-10-CM | POA: Diagnosis not present

## 2018-08-16 DIAGNOSIS — Z96651 Presence of right artificial knee joint: Secondary | ICD-10-CM | POA: Diagnosis not present

## 2018-08-16 DIAGNOSIS — M12812 Other specific arthropathies, not elsewhere classified, left shoulder: Secondary | ICD-10-CM | POA: Diagnosis not present

## 2018-08-16 DIAGNOSIS — M12811 Other specific arthropathies, not elsewhere classified, right shoulder: Secondary | ICD-10-CM | POA: Diagnosis not present

## 2018-08-17 DIAGNOSIS — G4733 Obstructive sleep apnea (adult) (pediatric): Secondary | ICD-10-CM | POA: Diagnosis not present

## 2018-08-26 ENCOUNTER — Other Ambulatory Visit: Payer: Self-pay | Admitting: Family Medicine

## 2018-08-26 DIAGNOSIS — G4700 Insomnia, unspecified: Secondary | ICD-10-CM

## 2018-08-26 NOTE — Telephone Encounter (Signed)
Refill request for general medication.Trazodone.   Last office visit 07/27/2018    Follow up on 11/25/2018

## 2018-09-30 DIAGNOSIS — Z1283 Encounter for screening for malignant neoplasm of skin: Secondary | ICD-10-CM | POA: Diagnosis not present

## 2018-09-30 DIAGNOSIS — L578 Other skin changes due to chronic exposure to nonionizing radiation: Secondary | ICD-10-CM | POA: Diagnosis not present

## 2018-09-30 DIAGNOSIS — L811 Chloasma: Secondary | ICD-10-CM | POA: Diagnosis not present

## 2018-10-13 DIAGNOSIS — I10 Essential (primary) hypertension: Secondary | ICD-10-CM | POA: Diagnosis not present

## 2018-10-13 DIAGNOSIS — R600 Localized edema: Secondary | ICD-10-CM | POA: Diagnosis not present

## 2018-10-13 DIAGNOSIS — Z780 Asymptomatic menopausal state: Secondary | ICD-10-CM | POA: Diagnosis not present

## 2018-10-13 DIAGNOSIS — E7849 Other hyperlipidemia: Secondary | ICD-10-CM | POA: Diagnosis not present

## 2018-10-13 DIAGNOSIS — R0602 Shortness of breath: Secondary | ICD-10-CM | POA: Diagnosis not present

## 2018-10-18 ENCOUNTER — Other Ambulatory Visit: Payer: Self-pay | Admitting: Family Medicine

## 2018-10-18 DIAGNOSIS — M205X1 Other deformities of toe(s) (acquired), right foot: Secondary | ICD-10-CM | POA: Diagnosis not present

## 2018-10-18 DIAGNOSIS — L851 Acquired keratosis [keratoderma] palmaris et plantaris: Secondary | ICD-10-CM | POA: Diagnosis not present

## 2018-10-18 DIAGNOSIS — Z1231 Encounter for screening mammogram for malignant neoplasm of breast: Secondary | ICD-10-CM

## 2018-10-18 DIAGNOSIS — D2372 Other benign neoplasm of skin of left lower limb, including hip: Secondary | ICD-10-CM | POA: Diagnosis not present

## 2018-10-18 DIAGNOSIS — Q66211 Congenital metatarsus primus varus, right foot: Secondary | ICD-10-CM | POA: Diagnosis not present

## 2018-10-18 DIAGNOSIS — M898X9 Other specified disorders of bone, unspecified site: Secondary | ICD-10-CM | POA: Diagnosis not present

## 2018-10-29 ENCOUNTER — Ambulatory Visit
Admission: RE | Admit: 2018-10-29 | Discharge: 2018-10-29 | Disposition: A | Discharge status: Home / Self Care | Payer: BLUE CROSS/BLUE SHIELD | Source: Ambulatory Visit | Attending: Family Medicine | Admitting: Family Medicine

## 2018-10-29 DIAGNOSIS — Z1231 Encounter for screening mammogram for malignant neoplasm of breast: Secondary | ICD-10-CM | POA: Diagnosis not present

## 2018-11-10 ENCOUNTER — Telehealth: Payer: Self-pay | Admitting: Family Medicine

## 2018-11-10 NOTE — Telephone Encounter (Signed)
I called the patient to schedule her AWV-S with [facility].  She said that [facility] is scheduled to come to their home on 11/18/2018 for wellness visit.  I explained that we prefer that she has it completed in the office so that we will have a record of it.  She said that she will keep the appointment with [facility] for now and schedule it with [place] the next time. VDM (DD)

## 2018-11-20 ENCOUNTER — Emergency Department: Payer: PRIVATE HEALTH INSURANCE

## 2018-11-20 ENCOUNTER — Emergency Department
Admission: EM | Admit: 2018-11-20 | Discharge: 2018-11-20 | Disposition: A | Discharge status: Home / Self Care | Payer: PRIVATE HEALTH INSURANCE | Attending: Emergency Medicine | Admitting: Emergency Medicine

## 2018-11-20 ENCOUNTER — Other Ambulatory Visit: Payer: Self-pay

## 2018-11-20 DIAGNOSIS — E1122 Type 2 diabetes mellitus with diabetic chronic kidney disease: Secondary | ICD-10-CM | POA: Insufficient documentation

## 2018-11-20 DIAGNOSIS — N183 Chronic kidney disease, stage 3 (moderate): Secondary | ICD-10-CM | POA: Diagnosis not present

## 2018-11-20 DIAGNOSIS — Y9289 Other specified places as the place of occurrence of the external cause: Secondary | ICD-10-CM | POA: Diagnosis not present

## 2018-11-20 DIAGNOSIS — Z96642 Presence of left artificial hip joint: Secondary | ICD-10-CM | POA: Insufficient documentation

## 2018-11-20 DIAGNOSIS — M7918 Myalgia, other site: Secondary | ICD-10-CM | POA: Insufficient documentation

## 2018-11-20 DIAGNOSIS — Z79899 Other long term (current) drug therapy: Secondary | ICD-10-CM | POA: Diagnosis not present

## 2018-11-20 DIAGNOSIS — Y998 Other external cause status: Secondary | ICD-10-CM | POA: Insufficient documentation

## 2018-11-20 DIAGNOSIS — M159 Polyosteoarthritis, unspecified: Secondary | ICD-10-CM

## 2018-11-20 DIAGNOSIS — Z96651 Presence of right artificial knee joint: Secondary | ICD-10-CM | POA: Insufficient documentation

## 2018-11-20 DIAGNOSIS — E1140 Type 2 diabetes mellitus with diabetic neuropathy, unspecified: Secondary | ICD-10-CM | POA: Insufficient documentation

## 2018-11-20 DIAGNOSIS — M25511 Pain in right shoulder: Secondary | ICD-10-CM | POA: Insufficient documentation

## 2018-11-20 DIAGNOSIS — Z96652 Presence of left artificial knee joint: Secondary | ICD-10-CM | POA: Diagnosis not present

## 2018-11-20 DIAGNOSIS — M25551 Pain in right hip: Secondary | ICD-10-CM | POA: Insufficient documentation

## 2018-11-20 DIAGNOSIS — Y9389 Activity, other specified: Secondary | ICD-10-CM | POA: Diagnosis not present

## 2018-11-20 DIAGNOSIS — M542 Cervicalgia: Secondary | ICD-10-CM | POA: Diagnosis present

## 2018-11-20 DIAGNOSIS — W19XXXA Unspecified fall, initial encounter: Secondary | ICD-10-CM | POA: Diagnosis not present

## 2018-11-20 DIAGNOSIS — I129 Hypertensive chronic kidney disease with stage 1 through stage 4 chronic kidney disease, or unspecified chronic kidney disease: Secondary | ICD-10-CM | POA: Diagnosis not present

## 2018-11-20 DIAGNOSIS — M150 Primary generalized (osteo)arthritis: Secondary | ICD-10-CM | POA: Insufficient documentation

## 2018-11-20 DIAGNOSIS — M8949 Other hypertrophic osteoarthropathy, multiple sites: Secondary | ICD-10-CM | POA: Diagnosis not present

## 2018-11-20 MED ORDER — ORPHENADRINE CITRATE 30 MG/ML IJ SOLN
60.0000 mg | Freq: Two times a day (BID) | INTRAMUSCULAR | Status: DC
  Administered 2018-11-20: 60 mg via INTRAMUSCULAR
  Filled 2018-11-20: qty 2

## 2018-11-20 MED ORDER — TRAMADOL HCL 50 MG PO TABS: 50.0000 mg | ORAL_TABLET | Freq: Two times a day (BID) | ORAL | 0 refills | Status: DC | PRN

## 2018-11-20 MED ORDER — CYCLOBENZAPRINE HCL 10 MG PO TABS: 10.0000 mg | ORAL_TABLET | Freq: Three times a day (TID) | ORAL | 0 refills | Status: DC | PRN

## 2018-11-20 MED ORDER — HYDROMORPHONE HCL 1 MG/ML IJ SOLN
1.0000 mg | Freq: Once | INTRAMUSCULAR | Status: AC
  Administered 2018-11-20: 1 mg via INTRAMUSCULAR
  Filled 2018-11-20: qty 1

## 2018-11-20 NOTE — ED Triage Notes (Signed)
[place] this am, R hip pain, ambulates into ED,

## 2018-11-20 NOTE — ED Notes (Addendum)
Pt presents today post fall from [place]. Pt was at work when fell. Pt is ambulatory. Pt states she was placing clothing on rack when a piece of the flooring had come up and pt trip and fell. Pt states she hit R/s head R Shoulder and R Hip.  Pt will be worked up as a [place].

## 2018-11-20 NOTE — ED Provider Notes (Signed)
[facility] Emergency Department Provider Note   ____________________________________________   First MD Initiated Contact with Patient 11/20/18 1010     (approximate)  I have reviewed the triage vital signs and the nursing notes.   HISTORY  Chief Complaint Hip Pain    HPI Becky Trevino is a 73 y.o. female patient presents with neck, right shoulder, and right hip pain secondary to a trip and fall prior to arrival.  Patient incident occurred at work.  Patient denies head injury or LOC.  Patient rates pain 6/10.  Patient described the pain is "achy".  Patient is able to bear weight with discomfort.  No palliative measures prior to arrival.  Patient has history of bilateral knee/hip replacement.     Past Medical History:  Diagnosis Date  . Cervical spondylosis   . Chronic kidney disease    Stage 3  . DDD (degenerative disc disease), cervical    [facility]  . Diabetes mellitus without complication ([place])   . Diverticulosis   . Hyperlipidemia   . Hypertension   . Intertrigo   . Leg cramps   . Leg varices   . Obesity   . OSA (obstructive sleep apnea)   . Symptomatic menopausal or female climacteric states     Patient Active Problem List   Diagnosis Date Noted  . Infection and inflammatory reaction due to internal joint prosthesis, subsequent encounter 01/25/2018  . Infection of total right knee replacement ([place]) 11/19/2017  . Type 2 diabetes mellitus with diabetic neuropathy, without long-term current use of insulin ([place]) 11/06/2016  . Primary osteoarthritis of knee 03/11/2016  . Primary osteoarthritis of left hip 12/11/2015  . Benign essential HTN 04/21/2015  . Edema leg 04/21/2015  . Chronic kidney disease (CKD), stage III (moderate) ([place]) 04/21/2015  . Chronic venous insufficiency 04/21/2015  . DDD (degenerative disc disease), cervical 04/21/2015  . Diabetes mellitus with renal manifestation ([place]) 04/21/2015  . Dyslipidemia  04/21/2015  . Bladder cystocele 04/21/2015  . Urinary incontinence 04/21/2015  . Leg varices 04/21/2015  . Insomnia 04/21/2015  . Eczema intertrigo 04/21/2015  . Obesity, morbid ([place]) 04/21/2015  . Obstructive apnea 04/21/2015  . Menopausal and perimenopausal disorder 04/21/2015  . Supraventricular premature beats 04/21/2015  . Osteoarthritis of multiple joints 04/21/2015  . H/O Spinal surgery 11/14/2013  . Detrusor muscle hypertonia 11/07/2013    Past Surgical History:  Procedure Laterality Date  . ABDOMINAL HYSTERECTOMY  1993   Total  . BUNIONECTOMY Bilateral 1993  . I&D KNEE WITH POLY EXCHANGE Right 11/19/2017   Procedure: RIGHT KNEE POLY EXCHANGE WITH IRRIGATION AND DEBRIDEMENT;  Surgeon: Hessie Knows, MD;  Location: [facility] ORS;  Service: Orthopedics;  Laterality: Right;  . JOINT REPLACEMENT     bilateral knee  . LAMINECTOMY  11/14/2013    Cervical Fusion , [facility], Dr. Delilah Shan  . LASER ABLATION Bilateral 07/29/2012   Dr. Lucky Cowboy  . REPLACEMENT TOTAL KNEE Left 03/2010   Dr. Rudene Christians  . TOTAL HIP ARTHROPLASTY Left 12/11/2015   Procedure: TOTAL HIP ARTHROPLASTY ANTERIOR APPROACH;  Surgeon: Hessie Knows, MD;  Location: [facility] ORS;  Service: Orthopedics;  Laterality: Left;  . TOTAL KNEE ARTHROPLASTY Right 03/11/2016   Procedure: TOTAL KNEE ARTHROPLASTY;  Surgeon: Hessie Knows, MD;  Location: [facility] ORS;  Service: Orthopedics;  Laterality: Right;    Prior to Admission medications   Medication Sig Start Date End Date Taking? Authorizing Provider  acetaminophen (TYLENOL) 650 MG CR tablet Take 650-1,300 mg by mouth 2 (two) times daily as  needed for pain.    [provider]  atorvastatin (LIPITOR) 40 MG tablet Take 1 tablet (40 mg total) by mouth daily at 6 PM. 07/27/18   Ancil Boozer, Drue Stager, MD  Blood Glucose Monitoring Suppl (FIFTY50 GLUCOSE METER 2.0) w/Device KIT Use as directed.    [provider]  Calcium Carbonate-Vitamin D (CALCIUM 600-D) 600-400 MG-UNIT per tablet Take 1 tablet by  mouth 2 (two) times daily. 02/01/09   [provider]  celecoxib (CELEBREX) 200 MG capsule Take 200 mg by mouth daily as needed for moderate pain.  07/20/17   [provider]  chlorhexidine (PERIDEX) 0.12 % solution  12/21/13   [provider]  cyclobenzaprine (FLEXERIL) 10 MG tablet Take 1 tablet (10 mg total) by mouth 3 (three) times daily as needed. 11/20/18   Sable Feil, PA-C  doxycycline (DORYX) 100 MG EC tablet Take 1 tablet (100 mg total) by mouth 2 (two) times daily. 06/17/18   [name], MD  Elastic Bandages & Supports (MEDICAL COMPRESSION STOCKINGS) [place]  09/14/14   [provider]  fluticasone (FLONASE) 50 MCG/ACT nasal spray Place 2 sprays into both nostrils daily. Patient taking differently: Place 2 sprays into both nostrils daily as needed for allergies.  05/21/17   [name], MD  furosemide (LASIX) 20 MG tablet Take 20 mg by mouth daily. 08/17/15   Callwood, Dwayne D, MD  gabapentin (NEURONTIN) 300 MG capsule Take 1 capsule (300 mg total) by mouth every evening. 07/27/18   [name], MD  glucose blood (ONE TOUCH ULTRA TEST) test strip Use as instructed 08/27/15   [name], MD  imipramine (TOFRANIL) 25 MG tablet Take 25 mg by mouth every evening.     Murrell Redden, MD  ketoconazole (NIZORAL) 2 % cream Apply 1 application topically 2 (two) times daily as needed (rash).  01/26/15   [provider]  KLOR-CON 10 10 MEQ tablet Take 10 mEq by mouth daily.  02/08/16   [provider]  Lancet Devices ([employer] LANCING DEVICE) MISC 1 each.    [provider]  latanoprost (XALATAN) 0.005 % ophthalmic solution Place 1 drop into both eyes at bedtime.  08/22/13   [provider]  Magnesium Oxide 500 MG CAPS Take 500 mg by mouth daily.  11/10/07   [provider]  metoprolol succinate (TOPROL-XL) 100 MG 24 hr tablet Take 1 tablet (100 mg total) by mouth every evening. Take with or immediately following  a meal. 07/27/18   Sowles, Drue Stager, MD  oxyCODONE (OXY IR/ROXICODONE) 5 MG immediate release tablet Take 1-2 tablets (5-10 mg total) by mouth every 3 (three) hours as needed for severe pain. 11/22/17   Watt Climes, PA  traMADol (ULTRAM) 50 MG tablet Take 1 tablet (50 mg total) by mouth every 12 (twelve) hours as needed. 11/20/18   Sable Feil, PA-C  traZODone (DESYREL) 50 MG tablet TAKE 1 TABLET BY MOUTH AT BEDTIME AS NEEDED FOR SLEEP 08/29/18   [name], MD    Allergies Lisinopril  Family History  Problem Relation Age of Onset  . Diabetes Mother   . Hyperlipidemia Mother   . Hypertension Mother   . Diabetes Father   . Hyperlipidemia Father   . Hypertension Father   . Obesity Father   . Hypertension Sister   . Hyperlipidemia Sister   . Hyperlipidemia Sister   . Breast cancer Neg Hx     Social History Social History   Tobacco Use  . Smoking status:  Never Smoker  . Smokeless tobacco: Never Used  Substance Use Topics  . Alcohol use: No    Alcohol/week: 0.0 standard drinks  . Drug use: No    Review of Systems Constitutional: No fever/chills Eyes: No visual changes. ENT: No sore throat. Cardiovascular: Denies chest pain. Respiratory: Denies shortness of breath. Gastrointestinal: No abdominal pain.  No nausea, no vomiting.  No diarrhea.  No constipation. Genitourinary: Negative for dysuria. Musculoskeletal: Right hip pain. Skin: Negative for rash. Neurological: Negative for headaches, focal weakness or numbness. Endocrine:  Diabetes, hyperlipidemia, hypertension. Allergic/Immunilogical: Lisinopril. ____________________________________________   PHYSICAL EXAM:  VITAL SIGNS: ED Triage Vitals [11/20/18 1001]  Enc Vitals Group     BP 138/78     Pulse Rate 71     Resp 18     Temp 97.7 F (36.5 C)     Temp Source Oral     SpO2 100 %     Weight 250 lb (113.4 kg)     Height 5' 11"  (1.803 m)     Head Circumference      Peak Flow      Pain Score 6      Pain Loc      Pain Edu?      Excl. in [place]?     Constitutional: Alert and oriented. Well appearing and in no acute distress.  Morbid obesity. Neck: No stridor.  No cervical spine tenderness to palpation. Hematological/Lymphatic/Immunilogical: No cervical lymphadenopathy. Cardiovascular: Normal rate, regular rhythm. Grossly normal heart sounds.  Good peripheral circulation. Respiratory: Normal respiratory effort.  No retractions. Lungs CTAB. Gastrointestinal: Soft and nontender. No distention. No abdominal bruits. No CVA tenderness. Musculoskeletal: No obvious deformity of the right hip.  No leg length discrepancy.  Patient is moderate guarding palpation lateral posterior hip.   Neurologic:  Normal speech and language. No gross focal neurologic deficits are appreciated. No gait instability. Skin:  Skin is warm, dry and intact. No rash noted. Psychiatric: Mood and affect are normal. Speech and behavior are normal.  ____________________________________________   LABS (all labs ordered are listed, but only abnormal results are displayed)  Labs Reviewed - No data to display ____________________________________________  EKG   ____________________________________________  RADIOLOGY  ED MD interpretation:    Official radiology report(s): Dg Cervical Spine 2-3 Views  Result Date: 11/20/2018 CLINICAL DATA:  Pain secondary to fall.  History of spinal surgery. EXAM: CERVICAL SPINE - 2-3 VIEW COMPARISON:  05/19/2018 FINDINGS: Again noted is surgical fusion of the cervical spine with fixation to the occiput. Again noted are surgical screws in the occiput and rods extending down to the level of C5. Surgical screws in the region at C3, C4 and C5 appear to be stable. Extensive degenerative change and osteophytosis in the cervical spine. The alignment of cervical spine is stable. Normal alignment at the cervicothoracic junction. No change in the surgical fusion hardware. Prevertebral soft tissues are  normal. Again noted is dental hardware in the mandible. IMPRESSION: Stable appearance of the occipital-cervical fusion. No evidence for a surgical hardware complication. Extensive degenerative changes and postoperative changes in the cervical spine. No evidence for acute abnormality. Electronically Signed   By: Markus Daft M.D.   On: 11/20/2018 11:55   Dg Shoulder Right  Result Date: 11/20/2018 CLINICAL DATA:  Right neck and shoulder pain following a fall today. EXAM: RIGHT SHOULDER - 2+ VIEW COMPARISON:  Right shoulder MRI dated 06/17/2013. FINDINGS: Marked right shoulder degenerative changes with superior migration of the humeral head and extensive spur formation  between the humeral head and acromion. There is also poor definition of the bone margins of the humeral head and adjacent acromion. No fracture or dislocation is seen. IMPRESSION: 1. No fracture or dislocation. 2. Marked right shoulder degenerative changes associated with a previously demonstrated large, chronic rotator cuff tear. Electronically Signed   By: Claudie Revering M.D.   On: 11/20/2018 11:48   Dg Hip Unilat W Or Wo Pelvis 2-3 Views Right  Result Date: 11/20/2018 CLINICAL DATA:  Right lateral hip pain following a fall today. EXAM: DG HIP (WITH OR WITHOUT PELVIS) 2-3V RIGHT COMPARISON:  None. FINDINGS: Marked right hip joint space narrowing with associated extensive sclerosis and subarticular cyst formation on both sides of the joint space. Left hip prosthesis. No fracture or dislocation seen. Lower lumbar spine degenerative changes. IMPRESSION: 1. No fracture or dislocation. 2. Marked right hip degenerative changes. 3. Lower lumbar spine degenerative changes. Electronically Signed   By: Claudie Revering M.D.   On: 11/20/2018 11:49    ____________________________________________   PROCEDURES  Procedure(s) performed (including Critical Care):  Procedures   ____________________________________________   INITIAL IMPRESSION / ASSESSMENT  AND PLAN / ED COURSE  As part of my medical decision making, I reviewed the following data within the [place]         Patient presents with neck, right shoulder, and right hip pain secondary to a fall at work.  Discussed x-ray findings with patient consistent mostly degenerative changes in the neck, shoulder, and hips.  Patient given discharge care instructions advised take medication as directed.  Patient with a work note for 2 days.  Patient advised follow-up PCP.      ____________________________________________   FINAL CLINICAL IMPRESSION(S) / ED DIAGNOSES  Final diagnoses:  Fall, initial encounter  Musculoskeletal pain  Primary osteoarthritis involving multiple joints     ED Discharge Orders         Ordered    cyclobenzaprine (FLEXERIL) 10 MG tablet  3 times daily PRN     11/20/18 1215    traMADol (ULTRAM) 50 MG tablet  Every 12 hours PRN     11/20/18 1215           Note:  This document was prepared using Dragon voice recognition software and may include unintentional dictation errors.    Sable Feil, PA-C 11/20/18 1224    Delman Kitten, MD 11/20/18 [phone]

## 2018-11-25 ENCOUNTER — Ambulatory Visit (INDEPENDENT_AMBULATORY_CARE_PROVIDER_SITE_OTHER): Payer: BLUE CROSS/BLUE SHIELD | Admitting: Family Medicine

## 2018-11-25 ENCOUNTER — Encounter: Payer: Self-pay | Admitting: Family Medicine

## 2018-11-25 ENCOUNTER — Other Ambulatory Visit: Payer: Self-pay

## 2018-11-25 VITALS — BP 130/100 | HR 67 | Temp 98.0°F | Resp 16 | Ht 71.0 in | Wt 260.6 lb

## 2018-11-25 DIAGNOSIS — M009 Pyogenic arthritis, unspecified: Secondary | ICD-10-CM

## 2018-11-25 DIAGNOSIS — E785 Hyperlipidemia, unspecified: Secondary | ICD-10-CM

## 2018-11-25 DIAGNOSIS — E1140 Type 2 diabetes mellitus with diabetic neuropathy, unspecified: Secondary | ICD-10-CM | POA: Diagnosis not present

## 2018-11-25 DIAGNOSIS — I10 Essential (primary) hypertension: Secondary | ICD-10-CM | POA: Diagnosis not present

## 2018-11-25 DIAGNOSIS — M5030 Other cervical disc degeneration, unspecified cervical region: Secondary | ICD-10-CM

## 2018-11-25 LAB — POCT GLYCOSYLATED HEMOGLOBIN (HGB A1C): Hemoglobin A1C: 5.7 % — AB (ref 4.0–5.6)

## 2018-11-25 LAB — LIPID PANEL
Cholesterol: 148 mg/dL (ref <200)
HDL: 73 mg/dL (ref >=50)
LDL Cholesterol (Calc): 62 mg/dL (calc)
NON-HDL CHOLESTEROL (CALC): 75 mg/dL (ref <130)
TRIGLYCERIDES: 51 mg/dL (ref <150)
Total CHOL/HDL Ratio: 2 (calc) (ref <5.0)

## 2018-11-25 LAB — COMPLETE METABOLIC PANEL WITH GFR
AG RATIO: 1.2 (calc) (ref 1.0–2.5)
ALBUMIN MSPROF: 4.1 g/dL (ref 3.6–5.1)
ALT: 22 U/L (ref 6–29)
AST: 26 U/L (ref 10–35)
Alkaline phosphatase (APISO): 106 U/L (ref 37–153)
BUN / CREAT RATIO: 16 (calc) (ref 6–22)
BUN: 21 mg/dL (ref 7–25)
CALCIUM: 9.7 mg/dL (ref 8.6–10.4)
CO2: 28 mmol/L (ref 20–32)
Chloride: 103 mmol/L (ref 98–110)
Creat: 1.33 mg/dL — ABNORMAL HIGH (ref 0.60–0.93)
GFR, EST AFRICAN AMERICAN: 46 mL/min/{1.73_m2} — AB (ref >=60)
GFR, EST NON AFRICAN AMERICAN: 40 mL/min/{1.73_m2} — AB (ref >=60)
Globulin: 3.3 g/dL (calc) (ref 1.9–3.7)
Glucose, Bld: 86 mg/dL (ref 65–99)
POTASSIUM: 4.9 mmol/L (ref 3.5–5.3)
Sodium: 140 mmol/L (ref 135–146)
TOTAL PROTEIN: 7.4 g/dL (ref 6.1–8.1)
Total Bilirubin: 0.5 mg/dL (ref 0.2–1.2)

## 2018-11-25 MED ORDER — ATORVASTATIN CALCIUM 40 MG PO TABS: 40.0000 mg | ORAL_TABLET | Freq: Every day | ORAL | 1 refills | Status: DC

## 2018-11-25 MED ORDER — METOPROLOL SUCCINATE ER 100 MG PO TB24: 100.0000 mg | ORAL_TABLET | Freq: Every evening | ORAL | 1 refills | Status: DC

## 2018-11-25 MED ORDER — GABAPENTIN 300 MG PO CAPS: 300.0000 mg | ORAL_CAPSULE | Freq: Every evening | ORAL | 2 refills | Status: DC

## 2018-11-25 NOTE — Progress Notes (Signed)
Name: Becky Trevino   MRN: [id number]    DOB: [birth year]   Date:11/25/2018       Progress Note  Subjective  Chief Complaint  Chief Complaint  Patient presents with  . Diabetes  . Hypertension    HPI  DMII with renal manifestation and neuropathy: fsbs 120's  HbgA1C is still at goal.- today 5.7%Urine microwasback to normal, not on ARB because of angiodema. Doing well, no polyphagia or polydipsia, she used to have urinary frequency but controlled with medication given by Urologist. . She sees Podiatrist she has corns and neuropathy, she is on gabapentin and it seems to help with symptoms. On life style modification only . She states eye exam is up to date and we will obtain copy of results   Obesity Morbid: her weight has gone up a little to 260.6lbs. . She has DM, insulin resistance,sheis off Victoza, she states weight watchers worked well of her in the past and she is contemplating in starting it back   HTN:bp is elevated today, but usually at goal and it was 120/80 back in Dec. she has been taking Metoprolol , no dizziness, no chest pain or palpitation . Advised her to monitor at work for now and to contact us if bp remains elevated   Hyperlipidemia: taking Atorvastatin daily and denies side effects. No chest pain , no muscles pain.Due for repeat labs by next visit   Insomnia/OSA : She is wearing CPAP and is doing well,taking trazodone very seldom. Unchanged   OA: multi joints, sees Ortho, Dr. Rudene Christians. Both kneestaking Tylenol and stable at this time. She had revision of right knee replacement March 2019 because of infection. Still under the care of Ortho and also ID, she is still taking oral antibiotics daily,  she denies redness or pain on right knee.    Patient Active Problem List   Diagnosis Date Noted  . Chronic infection of right knee ([place]) 11/25/2018  . Infection and inflammatory reaction due to internal joint prosthesis, subsequent encounter 01/25/2018  .  Infection of total right knee replacement ([place]) 11/19/2017  . Type 2 diabetes mellitus with diabetic neuropathy, without long-term current use of insulin ([place]) 11/06/2016  . Primary osteoarthritis of knee 03/11/2016  . Primary osteoarthritis of left hip 12/11/2015  . Benign essential HTN 04/21/2015  . Edema leg 04/21/2015  . Chronic kidney disease (CKD), stage III (moderate) ([place]) 04/21/2015  . Chronic venous insufficiency 04/21/2015  . DDD (degenerative disc disease), cervical 04/21/2015  . Diabetes mellitus with renal manifestation ([place]) 04/21/2015  . Dyslipidemia 04/21/2015  . Bladder cystocele 04/21/2015  . Urinary incontinence 04/21/2015  . Leg varices 04/21/2015  . Insomnia 04/21/2015  . Eczema intertrigo 04/21/2015  . Obesity, morbid ([place]) 04/21/2015  . Obstructive apnea 04/21/2015  . Menopausal and perimenopausal disorder 04/21/2015  . Supraventricular premature beats 04/21/2015  . Osteoarthritis of multiple joints 04/21/2015  . H/O Spinal surgery 11/14/2013  . Detrusor muscle hypertonia 11/07/2013    Past Surgical History:  Procedure Laterality Date  . ABDOMINAL HYSTERECTOMY  1993   Total  . BUNIONECTOMY Bilateral 1993  . I&D KNEE WITH POLY EXCHANGE Right 11/19/2017   Procedure: RIGHT KNEE POLY EXCHANGE WITH IRRIGATION AND DEBRIDEMENT;  Surgeon: Hessie Knows, MD;  Location: [facility] ORS;  Service: Orthopedics;  Laterality: Right;  . JOINT REPLACEMENT     bilateral knee  . LAMINECTOMY  11/14/2013    Cervical Fusion , [facility], Dr. Delilah Shan  . LASER ABLATION Bilateral 07/29/2012   Dr. Lucky Cowboy  .  REPLACEMENT TOTAL KNEE Left 03/2010   Dr. Rudene Christians  . TOTAL HIP ARTHROPLASTY Left 12/11/2015   Procedure: TOTAL HIP ARTHROPLASTY ANTERIOR APPROACH;  Surgeon: Hessie Knows, MD;  Location: [facility] ORS;  Service: Orthopedics;  Laterality: Left;  . TOTAL KNEE ARTHROPLASTY Right 03/11/2016   Procedure: TOTAL KNEE ARTHROPLASTY;  Surgeon: Hessie Knows, MD;  Location: [facility] ORS;  Service: Orthopedics;   Laterality: Right;    Family History  Problem Relation Age of Onset  . Diabetes Mother   . Hyperlipidemia Mother   . Hypertension Mother   . Diabetes Father   . Hyperlipidemia Father   . Hypertension Father   . Obesity Father   . Hypertension Sister   . Hyperlipidemia Sister   . Hyperlipidemia Sister   . Breast cancer Neg Hx     Social History   Socioeconomic History  . Marital status: Married    Spouse name: Not on file  . Number of children: Not on file  . Years of education: Not on file  . Highest education level: Not on file  Occupational History  . Not on file  Social Needs  . Financial resource strain: Not on file  . Food insecurity:    Worry: Not on file    Inability: Not on file  . Transportation needs:    Medical: Not on file    Non-medical: Not on file  Tobacco Use  . Smoking status: Never Smoker  . Smokeless tobacco: Never Used  Substance and Sexual Activity  . Alcohol use: No    Alcohol/week: 0.0 standard drinks  . Drug use: No  . Sexual activity: Yes    Partners: Male  Lifestyle  . Physical activity:    Days per week: Not on file    Minutes per session: Not on file  . Stress: Not on file  Relationships  . Social connections:    Talks on phone: Not on file    Gets together: Not on file    Attends religious service: Not on file    Active member of club or organization: Not on file    Attends meetings of clubs or organizations: Not on file    Relationship status: Not on file  . Intimate partner violence:    Fear of current or ex partner: Not on file    Emotionally abused: Not on file    Physically abused: Not on file    Forced sexual activity: Not on file  Other Topics Concern  . Not on file  Social History Narrative  . Not on file     Current Outpatient Medications:  .  acetaminophen (TYLENOL) 650 MG CR tablet, Take 650-1,300 mg by mouth 2 (two) times daily as needed for pain., Disp: , Rfl:  .  atorvastatin (LIPITOR) 40 MG tablet,  Take 1 tablet (40 mg total) by mouth daily at 6 PM., Disp: 90 tablet, Rfl: 1 .  Blood Glucose Monitoring Suppl (FIFTY50 GLUCOSE METER 2.0) w/Device KIT, Use as directed., Disp: , Rfl:  .  Calcium Carbonate-Vitamin D (CALCIUM 600-D) 600-400 MG-UNIT per tablet, Take 1 tablet by mouth 2 (two) times daily., Disp: , Rfl:  .  celecoxib (CELEBREX) 200 MG capsule, Take 200 mg by mouth daily as needed for moderate pain. , Disp: , Rfl: 11 .  chlorhexidine (PERIDEX) 0.12 % solution, , Disp: , Rfl:  .  doxycycline (DORYX) 100 MG EC tablet, Take 1 tablet (100 mg total) by mouth 2 (two) times daily., Disp: 60 tablet, Rfl: 3 .  Elastic Bandages & Supports (MEDICAL COMPRESSION STOCKINGS) MISC, , Disp: , Rfl:  .  fluticasone (FLONASE) 50 MCG/ACT nasal spray, Place 2 sprays into both nostrils daily. (Patient taking differently: Place 2 sprays into both nostrils daily as needed for allergies. ), Disp: 16 g, Rfl: 2 .  furosemide (LASIX) 20 MG tablet, Take 20 mg by mouth daily., Disp: , Rfl: 6 .  gabapentin (NEURONTIN) 300 MG capsule, Take 1 capsule (300 mg total) by mouth every evening., Disp: 90 capsule, Rfl: 2 .  glucose blood (ONE TOUCH ULTRA TEST) test strip, Use as instructed, Disp: 100 each, Rfl: 12 .  imipramine (TOFRANIL) 25 MG tablet, Take 25 mg by mouth every evening. , Disp: , Rfl:  .  ketoconazole (NIZORAL) 2 % cream, Apply 1 application topically 2 (two) times daily as needed (rash). , Disp: , Rfl:  .  KLOR-CON 10 10 MEQ tablet, Take 10 mEq by mouth daily. , Disp: , Rfl: 3 .  Lancet Devices ([employer] LANCING DEVICE) MISC, 1 each., Disp: , Rfl:  .  latanoprost (XALATAN) 0.005 % ophthalmic solution, Place 1 drop into both eyes at bedtime. , Disp: , Rfl:  .  Magnesium Oxide 500 MG CAPS, Take 500 mg by mouth daily. , Disp: , Rfl:  .  metoprolol succinate (TOPROL-XL) 100 MG 24 hr tablet, Take 1 tablet (100 mg total) by mouth every evening. Take with or immediately following a meal., Disp: 90 tablet, Rfl: 1 .   oxyCODONE (OXY IR/ROXICODONE) 5 MG immediate release tablet, Take 1-2 tablets (5-10 mg total) by mouth every 3 (three) hours as needed for severe pain., Disp: 60 tablet, Rfl: 0 .  traMADol (ULTRAM) 50 MG tablet, Take 1 tablet (50 mg total) by mouth every 12 (twelve) hours as needed., Disp: 12 tablet, Rfl: 0 .  traZODone (DESYREL) 50 MG tablet, TAKE 1 TABLET BY MOUTH AT BEDTIME AS NEEDED FOR SLEEP, Disp: 90 tablet, Rfl: 1  Allergies  Allergen Reactions  . Lisinopril Swelling    I personally reviewed active problem list, medication list, allergies, family history, social history with the patient/caregiver today.   ROS  Constitutional: Negative for fever, positive for  weight change.  Respiratory: Positive  For intermittent dry  cough but no  shortness of breath.   Cardiovascular: Negative for chest pain or palpitations.  Gastrointestinal: Negative for abdominal pain, no bowel changes.  Musculoskeletal: Positive for gait problem and intermittent left knee  joint swelling.  Skin: Negative for rash.  Neurological: Negative for dizziness or headache.  No other specific complaints in a complete review of systems (except as listed in HPI above).  Objective  Vitals:   11/25/18 0729 11/25/18 1230  BP: (!) 130/100 (!) 130/100  Pulse: 67   Resp: 16   Temp: 98 F (36.7 C)   TempSrc: Oral   SpO2: 98%   Weight: 260 lb 9.6 oz (118.2 kg)   Height: 5' 11"  (1.803 m)     Body mass index is 36.35 kg/m.  Physical Exam  Constitutional: Patient appears well-developed and well-nourished. Obese  No distress.  HEENT: head atraumatic, normocephalic, pupils equal and reactive to light,  neck supple, throat within normal limits Cardiovascular: Normal rate, regular rhythm and normal heart sounds.  No murmur heard. No BLE edema. Pulmonary/Chest: Effort normal and breath sounds normal. No respiratory distress. Muscular skeletal: slow gait, also decrease rom of neck secondary to surgery, pain on left  anterior knee with movement  Abdominal: Soft.  There is no tenderness. Psychiatric:  Patient has a normal mood and affect. behavior is normal. Judgment and thought content normal.  Recent Results (from the past 2160 hour(s))  POCT HgB A1C     Status: Abnormal   Collection Time: 11/25/18 10:15 AM  Result Value Ref Range   Hemoglobin A1C 5.7 (A) 4.0 - 5.6 %   HbA1c POC (<> result, manual entry)     HbA1c, POC (prediabetic range)     HbA1c, POC (controlled diabetic range)       PHQ2/9: Depression screen [facility] 2/9 11/25/2018 07/27/2018 03/25/2018 06/09/2017 03/10/2017  Decreased Interest 0 0 0 0 0  Down, Depressed, Hopeless 0 0 0 0 0  PHQ - 2 Score 0 0 0 0 0  Altered sleeping 0 0 0 - -  Tired, decreased energy 0 0 0 - -  Change in appetite 0 1 0 - -  Feeling bad or failure about yourself  0 0 0 - -  Trouble concentrating 0 1 0 - -  Moving slowly or fidgety/restless 0 0 0 - -  Suicidal thoughts - 0 0 - -  PHQ-9 Score 0 2 0 - -  Difficult doing work/chores - Not difficult at all Not difficult at all - -   phq 9 negative   Fall Risk: Fall Risk  11/25/2018 07/27/2018 03/25/2018 09/03/2017 06/09/2017  Falls in the past year? 1 0 No No No  Number falls in past yr: 1 0 - - -  Comment - - - - -  Injury with Fall? 0 0 - - -    Assessment & Plan   1. Type 2 diabetes mellitus with diabetic neuropathy, without long-term current use of insulin (HCC)  - POCT HgB A1C  2. Benign essential HTN  - COMPLETE METABOLIC PANEL WITH GFR - metoprolol succinate (TOPROL-XL) 100 MG 24 hr tablet; Take 1 tablet (100 mg total) by mouth every evening. Take with or immediately following a meal.  Dispense: 90 tablet; Refill: 1  3. Dyslipidemia  - atorvastatin (LIPITOR) 40 MG tablet; Take 1 tablet (40 mg total) by mouth daily at 6 PM.  Dispense: 90 tablet; Refill: 1 - Lipid panel  4. Chronic infection of right knee (HCC)  Under the care of Dr. Rudene Christians and still on antibiotics - right knee does not hurt   5.  Morbid obesity, unspecified obesity type [facility])  Discussed with the patient the risk posed by an increased BMI. Discussed importance of portion control, calorie counting and at least 150 minutes of physical activity weekly. Avoid sweet beverages and drink more water. Eat at least 6 servings of fruit and vegetables daily   6. DDD (degenerative disc disease), cervical  - gabapentin (NEURONTIN) 300 MG capsule; Take 1 capsule (300 mg total) by mouth every evening.  Dispense: 90 capsule; Refill: 2

## 2018-12-13 ENCOUNTER — Other Ambulatory Visit
Admission: RE | Admit: 2018-12-13 | Discharge: 2018-12-13 | Disposition: A | Discharge status: Home / Self Care | Payer: BLUE CROSS/BLUE SHIELD | Source: Ambulatory Visit | Attending: Orthopedic Surgery | Admitting: Orthopedic Surgery

## 2018-12-13 DIAGNOSIS — Z96651 Presence of right artificial knee joint: Secondary | ICD-10-CM | POA: Diagnosis not present

## 2018-12-13 DIAGNOSIS — M25551 Pain in right hip: Secondary | ICD-10-CM | POA: Diagnosis not present

## 2018-12-13 DIAGNOSIS — M25561 Pain in right knee: Secondary | ICD-10-CM | POA: Diagnosis not present

## 2018-12-13 DIAGNOSIS — M25461 Effusion, right knee: Secondary | ICD-10-CM | POA: Diagnosis not present

## 2018-12-13 LAB — SYNOVIAL CELL COUNT + DIFF, W/ CRYSTALS
CRYSTALS FLUID: NONE SEEN
EOSINOPHILS-SYNOVIAL: 0 %
LYMPHOCYTES-SYNOVIAL FLD: 2 %
MONOCYTE-MACROPHAGE-SYNOVIAL FLUID: 1 %
NEUTROPHIL, SYNOVIAL: 97 %
OTHER CELLS-SYN: 0
WBC, Synovial: 3342 /mm3 — ABNORMAL HIGH (ref 0–200)

## 2019-01-05 DIAGNOSIS — L851 Acquired keratosis [keratoderma] palmaris et plantaris: Secondary | ICD-10-CM | POA: Diagnosis not present

## 2019-01-05 DIAGNOSIS — B351 Tinea unguium: Secondary | ICD-10-CM | POA: Diagnosis not present

## 2019-01-05 DIAGNOSIS — E1140 Type 2 diabetes mellitus with diabetic neuropathy, unspecified: Secondary | ICD-10-CM | POA: Diagnosis not present

## 2019-01-14 DIAGNOSIS — G4733 Obstructive sleep apnea (adult) (pediatric): Secondary | ICD-10-CM | POA: Diagnosis not present

## 2019-02-02 DIAGNOSIS — H2511 Age-related nuclear cataract, right eye: Secondary | ICD-10-CM | POA: Diagnosis not present

## 2019-02-16 DIAGNOSIS — M25562 Pain in left knee: Secondary | ICD-10-CM | POA: Diagnosis not present

## 2019-02-16 DIAGNOSIS — M2342 Loose body in knee, left knee: Secondary | ICD-10-CM | POA: Diagnosis not present

## 2019-02-18 ENCOUNTER — Other Ambulatory Visit: Payer: Self-pay | Admitting: Urology

## 2019-02-18 ENCOUNTER — Telehealth: Payer: Self-pay | Admitting: Urology

## 2019-02-18 NOTE — Telephone Encounter (Signed)
Pt states she is out of Impiramine HCL and needs a new Rx. [facility]ope started pt on this Rx . [facility] [place]. Please advise pt at [phone]

## 2019-02-18 NOTE — Telephone Encounter (Signed)
There is a contraindication with the impiramine and tramadol.  Has she been taking these together?

## 2019-02-21 MED ORDER — IMIPRAMINE HCL 25 MG PO TABS: 25.0000 mg | ORAL_TABLET | Freq: Every day | ORAL | 3 refills | Status: DC

## 2019-02-21 NOTE — Telephone Encounter (Signed)
Left pt mess to call 

## 2019-02-21 NOTE — Telephone Encounter (Signed)
Patient states that she is not currently taking tramadol

## 2019-02-21 NOTE — Telephone Encounter (Signed)
Okay.  I have sent a refill for the [facility]ramine to the [facility].

## 2019-02-23 DIAGNOSIS — H2512 Age-related nuclear cataract, left eye: Secondary | ICD-10-CM | POA: Diagnosis not present

## 2019-03-16 ENCOUNTER — Other Ambulatory Visit: Payer: Self-pay | Admitting: Urology

## 2019-03-21 DIAGNOSIS — M205X1 Other deformities of toe(s) (acquired), right foot: Secondary | ICD-10-CM | POA: Diagnosis not present

## 2019-03-21 DIAGNOSIS — B351 Tinea unguium: Secondary | ICD-10-CM | POA: Diagnosis not present

## 2019-03-21 DIAGNOSIS — E1142 Type 2 diabetes mellitus with diabetic polyneuropathy: Secondary | ICD-10-CM | POA: Diagnosis not present

## 2019-03-21 DIAGNOSIS — L851 Acquired keratosis [keratoderma] palmaris et plantaris: Secondary | ICD-10-CM | POA: Diagnosis not present

## 2019-03-21 DIAGNOSIS — Q66211 Congenital metatarsus primus varus, right foot: Secondary | ICD-10-CM | POA: Diagnosis not present

## 2019-04-11 DIAGNOSIS — R0602 Shortness of breath: Secondary | ICD-10-CM | POA: Diagnosis not present

## 2019-04-11 DIAGNOSIS — R600 Localized edema: Secondary | ICD-10-CM | POA: Diagnosis not present

## 2019-04-11 DIAGNOSIS — E7849 Other hyperlipidemia: Secondary | ICD-10-CM | POA: Diagnosis not present

## 2019-04-11 DIAGNOSIS — I10 Essential (primary) hypertension: Secondary | ICD-10-CM | POA: Diagnosis not present

## 2019-04-14 DIAGNOSIS — G4733 Obstructive sleep apnea (adult) (pediatric): Secondary | ICD-10-CM | POA: Diagnosis not present

## 2019-04-27 DIAGNOSIS — E1142 Type 2 diabetes mellitus with diabetic polyneuropathy: Secondary | ICD-10-CM | POA: Diagnosis not present

## 2019-04-27 DIAGNOSIS — L97511 Non-pressure chronic ulcer of other part of right foot limited to breakdown of skin: Secondary | ICD-10-CM | POA: Diagnosis not present

## 2019-04-29 ENCOUNTER — Other Ambulatory Visit
Admission: RE | Admit: 2019-04-29 | Discharge: 2019-04-29 | Disposition: A | Discharge status: Home / Self Care | Payer: BC Managed Care – PPO | Source: Ambulatory Visit | Attending: Internal Medicine | Admitting: Internal Medicine

## 2019-04-29 ENCOUNTER — Emergency Department: Payer: BC Managed Care – PPO

## 2019-04-29 ENCOUNTER — Emergency Department
Admission: EM | Admit: 2019-04-29 | Discharge: 2019-04-29 | Disposition: A | Discharge status: Home / Self Care | Payer: BC Managed Care – PPO | Attending: Emergency Medicine | Admitting: Emergency Medicine

## 2019-04-29 ENCOUNTER — Other Ambulatory Visit: Payer: Self-pay

## 2019-04-29 DIAGNOSIS — Z96653 Presence of artificial knee joint, bilateral: Secondary | ICD-10-CM | POA: Diagnosis not present

## 2019-04-29 DIAGNOSIS — M79604 Pain in right leg: Secondary | ICD-10-CM

## 2019-04-29 DIAGNOSIS — I129 Hypertensive chronic kidney disease with stage 1 through stage 4 chronic kidney disease, or unspecified chronic kidney disease: Secondary | ICD-10-CM | POA: Diagnosis not present

## 2019-04-29 DIAGNOSIS — I82811 Embolism and thrombosis of superficial veins of right lower extremities: Secondary | ICD-10-CM | POA: Diagnosis not present

## 2019-04-29 DIAGNOSIS — N183 Chronic kidney disease, stage 3 (moderate): Secondary | ICD-10-CM | POA: Diagnosis not present

## 2019-04-29 DIAGNOSIS — I8290 Acute embolism and thrombosis of unspecified vein: Secondary | ICD-10-CM

## 2019-04-29 DIAGNOSIS — M7989 Other specified soft tissue disorders: Secondary | ICD-10-CM

## 2019-04-29 DIAGNOSIS — E1122 Type 2 diabetes mellitus with diabetic chronic kidney disease: Secondary | ICD-10-CM | POA: Diagnosis not present

## 2019-04-29 DIAGNOSIS — M79661 Pain in right lower leg: Secondary | ICD-10-CM | POA: Insufficient documentation

## 2019-04-29 DIAGNOSIS — R600 Localized edema: Secondary | ICD-10-CM | POA: Insufficient documentation

## 2019-04-29 DIAGNOSIS — Z79899 Other long term (current) drug therapy: Secondary | ICD-10-CM | POA: Insufficient documentation

## 2019-04-29 DIAGNOSIS — Z96642 Presence of left artificial hip joint: Secondary | ICD-10-CM | POA: Insufficient documentation

## 2019-04-29 DIAGNOSIS — R7989 Other specified abnormal findings of blood chemistry: Secondary | ICD-10-CM

## 2019-04-29 LAB — COMPREHENSIVE METABOLIC PANEL
ALT: 22 U/L (ref 0–44)
AST: 28 U/L (ref 15–41)
Albumin: 3.7 g/dL (ref 3.5–5.0)
Alkaline Phosphatase: 102 U/L (ref 38–126)
Anion gap: 7 (ref 5–15)
BUN: 21 mg/dL (ref 8–23)
CO2: 28 mmol/L (ref 22–32)
Calcium: 9.1 mg/dL (ref 8.9–10.3)
Chloride: 105 mmol/L (ref 98–111)
Creatinine, Ser: 1.09 mg/dL — ABNORMAL HIGH (ref 0.44–1.00)
GFR calc Af Amer: 58 mL/min — ABNORMAL LOW (ref >60)
GFR calc non Af Amer: 50 mL/min — ABNORMAL LOW (ref >60)
Glucose, Bld: 87 mg/dL (ref 70–99)
Potassium: 4 mmol/L (ref 3.5–5.1)
Sodium: 140 mmol/L (ref 135–145)
Total Bilirubin: 0.6 mg/dL (ref 0.3–1.2)
Total Protein: 7.7 g/dL (ref 6.5–8.1)

## 2019-04-29 LAB — CBC WITH DIFFERENTIAL/PLATELET
Abs Immature Granulocytes: 0.02 10*3/uL (ref 0.00–0.07)
Basophils Absolute: 0 10*3/uL (ref 0.0–0.1)
Basophils Relative: 1 %
Eosinophils Absolute: 0.3 10*3/uL (ref 0.0–0.5)
Eosinophils Relative: 4 %
HCT: 39 % (ref 36.0–46.0)
Hemoglobin: 12.2 g/dL (ref 12.0–15.0)
Immature Granulocytes: 0 %
Lymphocytes Relative: 27 %
Lymphs Abs: 1.6 10*3/uL (ref 0.7–4.0)
MCH: 30.5 pg (ref 26.0–34.0)
MCHC: 31.3 g/dL (ref 30.0–36.0)
MCV: 97.5 fL (ref 80.0–100.0)
Monocytes Absolute: 0.5 10*3/uL (ref 0.1–1.0)
Monocytes Relative: 9 %
Neutro Abs: 3.4 10*3/uL (ref 1.7–7.7)
Neutrophils Relative %: 59 %
Platelets: 192 10*3/uL (ref 150–400)
RBC: 4 MIL/uL (ref 3.87–5.11)
RDW: 15.4 % (ref 11.5–15.5)
WBC: 5.9 10*3/uL (ref 4.0–10.5)
nRBC: 0 % (ref 0.0–0.2)

## 2019-04-29 LAB — BRAIN NATRIURETIC PEPTIDE: B Natriuretic Peptide: 368 pg/mL — ABNORMAL HIGH (ref 0.0–100.0)

## 2019-04-29 LAB — FIBRIN DERIVATIVES D-DIMER (ARMC ONLY): Fibrin derivatives D-dimer (ARMC): 3542.59 ng/mL (FEU) — ABNORMAL HIGH (ref 0.00–499.00)

## 2019-04-29 MED ORDER — RIVAROXABAN 15 MG PO TABS
15.0000 mg | ORAL_TABLET | Freq: Once | ORAL | Status: AC
  Administered 2019-04-29: 15 mg via ORAL
  Filled 2019-04-29: qty 1

## 2019-04-29 MED ORDER — RIVAROXABAN (XARELTO) VTE STARTER PACK (15 & 20 MG): ORAL_TABLET | ORAL | 0 refills | Status: DC

## 2019-04-29 NOTE — ED Notes (Signed)
Pt has notable swelling to the right ankle up to the knee area, non pitting, reports no pain

## 2019-04-29 NOTE — ED Provider Notes (Signed)
[facility] Emergency Department Provider Note   ____________________________________________   First MD Initiated Contact with Patient 04/29/19 1712     (approximate)  I have reviewed the triage vital signs and the nursing notes.   HISTORY  Chief Complaint Leg Swelling    HPI Becky Trevino is a 73 y.o. female who complains of right leg pain and swelling.  She went to see her primary care doctor and was sent here.  Right leg is been swelling for a week.  She has no other complaints.  She has a known Baker's cyst behind the right knee.  The swelling is now worse.       Past Medical History:  Diagnosis Date  . Cervical spondylosis   . Chronic kidney disease    Stage 3  . DDD (degenerative disc disease), cervical    [facility]  . Diabetes mellitus without complication ([place])   . Diverticulosis   . Hyperlipidemia   . Hypertension   . Intertrigo   . Leg cramps   . Leg varices   . Obesity   . OSA (obstructive sleep apnea)   . Symptomatic menopausal or female climacteric states     Patient Active Problem List   Diagnosis Date Noted  . Chronic infection of right knee ([place]) 11/25/2018  . Infection and inflammatory reaction due to internal joint prosthesis, subsequent encounter 01/25/2018  . Infection of total right knee replacement ([place]) 11/19/2017  . Type 2 diabetes mellitus with diabetic neuropathy, without long-term current use of insulin ([place]) 11/06/2016  . Primary osteoarthritis of knee 03/11/2016  . Primary osteoarthritis of left hip 12/11/2015  . Benign essential HTN 04/21/2015  . Edema leg 04/21/2015  . Chronic kidney disease (CKD), stage III (moderate) ([place]) 04/21/2015  . Chronic venous insufficiency 04/21/2015  . DDD (degenerative disc disease), cervical 04/21/2015  . Diabetes mellitus with renal manifestation ([place]) 04/21/2015  . Dyslipidemia 04/21/2015  . Bladder cystocele 04/21/2015  . Urinary incontinence 04/21/2015  .  Leg varices 04/21/2015  . Insomnia 04/21/2015  . Eczema intertrigo 04/21/2015  . Obesity, morbid ([place]) 04/21/2015  . Obstructive apnea 04/21/2015  . Menopausal and perimenopausal disorder 04/21/2015  . Supraventricular premature beats 04/21/2015  . Osteoarthritis of multiple joints 04/21/2015  . H/O Spinal surgery 11/14/2013  . Detrusor muscle hypertonia 11/07/2013    Past Surgical History:  Procedure Laterality Date  . ABDOMINAL HYSTERECTOMY  1993   Total  . BUNIONECTOMY Bilateral 1993  . I&D KNEE WITH POLY EXCHANGE Right 11/19/2017   Procedure: RIGHT KNEE POLY EXCHANGE WITH IRRIGATION AND DEBRIDEMENT;  Surgeon: Hessie Knows, MD;  Location: [facility] ORS;  Service: Orthopedics;  Laterality: Right;  . JOINT REPLACEMENT     bilateral knee  . LAMINECTOMY  11/14/2013    Cervical Fusion , [facility], Dr. Delilah Shan  . LASER ABLATION Bilateral 07/29/2012   Dr. Lucky Cowboy  . REPLACEMENT TOTAL KNEE Left 03/2010   Dr. Rudene Christians  . TOTAL HIP ARTHROPLASTY Left 12/11/2015   Procedure: TOTAL HIP ARTHROPLASTY ANTERIOR APPROACH;  Surgeon: Hessie Knows, MD;  Location: [facility] ORS;  Service: Orthopedics;  Laterality: Left;  . TOTAL KNEE ARTHROPLASTY Right 03/11/2016   Procedure: TOTAL KNEE ARTHROPLASTY;  Surgeon: Hessie Knows, MD;  Location: [facility] ORS;  Service: Orthopedics;  Laterality: Right;    Prior to Admission medications   Medication Sig Start Date End Date Taking? Authorizing Provider  acetaminophen (TYLENOL) 650 MG CR tablet Take 650-1,300 mg by mouth 2 (two) times daily as needed for pain.  [provider]  atorvastatin (LIPITOR) 40 MG tablet Take 1 tablet (40 mg total) by mouth daily at 6 PM. 11/25/18   Ancil Boozer, Drue Stager, MD  Blood Glucose Monitoring Suppl (FIFTY50 GLUCOSE METER 2.0) w/Device KIT Use as directed.    [provider]  Calcium Carbonate-Vitamin D (CALCIUM 600-D) 600-400 MG-UNIT per tablet Take 1 tablet by mouth 2 (two) times daily. 02/01/09   [provider]  celecoxib (CELEBREX)  200 MG capsule Take 200 mg by mouth daily as needed for moderate pain.  07/20/17   [provider]  chlorhexidine (PERIDEX) 0.12 % solution  12/21/13   [provider]  doxycycline (DORYX) 100 MG EC tablet Take 1 tablet (100 mg total) by mouth 2 (two) times daily. 06/17/18   [name], MD  Elastic Bandages & Supports (MEDICAL COMPRESSION STOCKINGS) [place]  09/14/14   [provider]  fluticasone (FLONASE) 50 MCG/ACT nasal spray Place 2 sprays into both nostrils daily. Patient taking differently: Place 2 sprays into both nostrils daily as needed for allergies.  05/21/17   [name], MD  furosemide (LASIX) 20 MG tablet Take 20 mg by mouth daily. 08/17/15   Callwood, Dwayne D, MD  gabapentin (NEURONTIN) 300 MG capsule Take 1 capsule (300 mg total) by mouth every evening. 11/25/18   [name], MD  glucose blood (ONE TOUCH ULTRA TEST) test strip Use as instructed 08/27/15   [name], MD  imipramine (TOFRANIL) 25 MG tablet TAKE 1 TABLET BY MOUTH EVERYDAY AT BEDTIME 03/16/19   [name], Larene Beach A, PA-C  ketoconazole (NIZORAL) 2 % cream Apply 1 application topically 2 (two) times daily as needed (rash).  01/26/15   [provider]  KLOR-CON 10 10 MEQ tablet Take 10 mEq by mouth daily.  02/08/16   [provider]  Lancet Devices ([employer] LANCING DEVICE) MISC 1 each.    [provider]  latanoprost (XALATAN) 0.005 % ophthalmic solution Place 1 drop into both eyes at bedtime.  08/22/13   [provider]  Magnesium Oxide 500 MG CAPS Take 500 mg by mouth daily.  11/10/07   [provider]  metoprolol succinate (TOPROL-XL) 100 MG 24 hr tablet Take 1 tablet (100 mg total) by mouth every evening. Take with or immediately following a meal. 11/25/18   Sowles, Drue Stager, MD  oxyCODONE (OXY IR/ROXICODONE) 5 MG immediate release tablet Take 1-2 tablets (5-10 mg total) by mouth every 3 (three) hours as needed for severe pain. 11/22/17   Watt Climes, PA  Rivaroxaban 15 & 20 MG TBPK Take as directed on package: Start with one 21m tablet by mouth twice a day with food. On Day 22, switch to one 263mtablet once a day with food. 04/29/19   MaNena PolioMD  traMADol (ULTRAM) 50 MG tablet Take 1 tablet (50 mg total) by mouth every 12 (twelve) hours as needed. 11/20/18   SmSable FeilPA-C  traZODone (DESYREL) 50 MG tablet TAKE 1 TABLET BY MOUTH AT BEDTIME AS NEEDED FOR SLEEP 08/29/18   SoSteele SizerMD    Allergies Lisinopril  Family History  Problem Relation Age of Onset  . Diabetes Mother   . Hyperlipidemia Mother   . Hypertension Mother   . Diabetes Father   . Hyperlipidemia Father   . Hypertension Father   . Obesity Father   . Hypertension Sister   . Hyperlipidemia Sister   . Hyperlipidemia Sister   . Breast cancer Neg Hx     Social  History Social History   Tobacco Use  . Smoking status: Never Smoker  . Smokeless tobacco: Never Used  Substance Use Topics  . Alcohol use: No    Alcohol/week: 0.0 standard drinks  . Drug use: No    Review of Systems  Constitutional: No fever/chills Eyes: No visual changes. ENT: No sore throat. Cardiovascular: Denies chest pain. Respiratory: Denies shortness of breath. Gastrointestinal: No abdominal pain.  No nausea, no vomiting.  No diarrhea.  No constipation. Genitourinary: Negative for dysuria. Musculoskeletal: Negative for back pain. Skin: Negative for rash. Neurological: Negative for headaches, focal weakness o  ____________________________________________   PHYSICAL EXAM:  VITAL SIGNS: ED Triage Vitals  Enc Vitals Group     BP 04/29/19 1417 130/84     Pulse Rate 04/29/19 1417 76     Resp 04/29/19 1417 16     Temp 04/29/19 1417 97.9 F (36.6 C)     Temp Source 04/29/19 1417 Oral     SpO2 04/29/19 1417 100 %     Weight 04/29/19 1418 255 lb (115.7 kg)     Height 04/29/19 1418 5' 11"  (1.803 m)     Head Circumference --      Peak Flow --      Pain Score  04/29/19 1417 0     Pain Loc --      Pain Edu? --      Excl. in [place]? --     Constitutional: Alert and oriented. Well appearing and in no acute distress. Eyes: Conjunctivae are normal. Head: Atraumatic. Nose: No congestion/rhinnorhea. Mouth/Throat: Mucous membranes are moist.  Oropharynx non-erythematous. Neck: No stridor.  Cardiovascular: Normal rate, regular rhythm. Grossly normal heart sounds.  Good peripheral circulation. Respiratory: Normal respiratory effort.  No retractions. Lungs CTAB. Gastrointestinal: Soft and nontender. No distention. No abdominal bruits. No CVA tenderness. Musculoskeletal: Right leg is more swollen than the left slightly red diffusely.  There is no marked pain or swelling behind the right knee Neurologic:  Normal speech and language. No gross focal neurologic deficits are appreciated. Skin:  Skin is warm, dry and intact. No rash noted. Psychiatric: Mood and affect are normal. Speech and behavior are normal.  ____________________________________________   LABS (all labs ordered are listed, but only abnormal results are displayed)  Labs Reviewed  COMPREHENSIVE METABOLIC PANEL - Abnormal; Notable for the following components:      Result Value   Creatinine, Ser 1.09 (*)    GFR calc non Af Amer 50 (*)    GFR calc Af Amer 58 (*)    All other components within normal limits  CBC WITH DIFFERENTIAL/PLATELET   ____________________________________________  EKG   ____________________________________________  RADIOLOGY  ED MD interpretation: Ultrasound of the leg shows a nonocclusive thrombosis of the superficial saphenous vein just close to the saphenofemoral junction.  According to up-to-date this is a reason to anticoagulate the patient.  Official radiology report(s): US Venous Img Lower Unilateral Right  Result Date: 04/29/2019 CLINICAL DATA:  Pain and swelling x1 week, elevated D-dimer EXAM: RIGHT LOWER EXTREMITY VENOUS DOPPLER ULTRASOUND TECHNIQUE:  Gray-scale sonography with compression, as well as color and duplex ultrasound, were performed to evaluate the deep venous system from the level of the common femoral vein through the popliteal and proximal calf veins. COMPARISON:  04/12/1999 FINDINGS: Normal compressibility of the common femoral, superficial femoral, and popliteal veins, as well as the proximal calf veins. No filling defects to suggest DVT on grayscale or color Doppler imaging. Doppler waveforms show normal direction of  venous flow, normal respiratory phasicity and response to augmentation. There is nonocclusive thrombus in a branch of the right great saphenous vein just inferior to the saphenofemoral junction. Subcutaneous edema in the right calf. 4.1 x 1.8 x 2 cm fluid collection in the posterior popliteal fossa. survey views of the contralateral common femoral vein are unremarkable. IMPRESSION: 1. No femoropopliteal and no calf DVT in the visualized calf veins. If clinical symptoms are inconsistent or if there are persistent or worsening symptoms, further imaging (possibly involving the iliac veins) may be warranted. 2. Superficial thrombophlebitis involving a branch of the great saphenous vein in the proximal thigh. 3. Right Baker's cyst. Electronically Signed   By: Lucrezia Europe M.D.   On: 04/29/2019 15:53    ____________________________________________   PROCEDURES  Procedure(s) performed (including Critical Care):  Procedures   ____________________________________________   INITIAL IMPRESSION / ASSESSMENT AND PLAN / ED COURSE  Becky Trevino was evaluated in Emergency Department on 04/29/2019 for the symptoms described in the history of present illness. She was evaluated in the context of the global COVID-19 pandemic, which necessitated consideration that the patient might be at risk for infection with the SARS-CoV-2 virus that causes COVID-19. Institutional protocols and algorithms that pertain to the evaluation of patients  at risk for COVID-19 are in a state of rapid change based on information released by regulatory bodies including the CDC and federal and state organizations. These policies and algorithms were followed during the patient's care in the ED.    According to up-to-date a clot or patient has it is a reason to anticoagulate her.  We will go ahead and use Xarelto for this.  I will have her follow-up with her regular doctor.  She will return for any further problems.  She does not have a white count or fever at this time.        ____________________________________________   FINAL CLINICAL IMPRESSION(S) / ED DIAGNOSES  Final diagnoses:  Venous thrombosis     ED Discharge Orders         Ordered    Rivaroxaban 15 & 20 MG TBPK     04/29/19 1824           Note:  This document was prepared using Dragon voice recognition software and may include unintentional dictation errors.    Nena Polio, MD 04/29/19 [phone]

## 2019-04-29 NOTE — ED Notes (Signed)
Pt standing at sink after ambulating to the toilet when I enterred the room, NAD noted, A&Ox4

## 2019-04-29 NOTE — ED Triage Notes (Signed)
Sent to ER for further evaluation. Right leg pain and swelling with elevated d dimer drawn outpatient today. Reports right leg swelling X 1 week. Pt alert and oriented X4, active, cooperative, pt in NAD. RR even and unlabored, color WNL.

## 2019-04-29 NOTE — Discharge Instructions (Addendum)
Take the Xarelto as directed.  Please follow-up with your regular doctor later on this week.  Return for any further problems including increasing redness swelling or pain or any shortness of breath or chest pain or fever.  Please stop the Celebrex for now.  It may interact with the Xarelto.  Use Tylenol for pain.  Start the Xarelto prescription tomorrow.  I will have given you a dose for tonight here in the ER

## 2019-05-02 DIAGNOSIS — I824Y1 Acute embolism and thrombosis of unspecified deep veins of right proximal lower extremity: Secondary | ICD-10-CM | POA: Diagnosis not present

## 2019-05-02 DIAGNOSIS — T8450XD Infection and inflammatory reaction due to unspecified internal joint prosthesis, subsequent encounter: Secondary | ICD-10-CM | POA: Diagnosis not present

## 2019-05-02 DIAGNOSIS — M009 Pyogenic arthritis, unspecified: Secondary | ICD-10-CM | POA: Diagnosis not present

## 2019-05-04 DIAGNOSIS — E1142 Type 2 diabetes mellitus with diabetic polyneuropathy: Secondary | ICD-10-CM | POA: Diagnosis not present

## 2019-05-05 NOTE — Progress Notes (Signed)
05/06/2019 9:24 AM   Becky Trevino [birth year] [id number]  Referring provider: [name], MD [address] [place],  [place] [address]  Chief Complaint  Patient presents with  . Urinary Incontinence    HPI: Patient is a 73 year old  female with mixed urinary incontinence who presents today for a yearly follow up.  The patient is  experiencing urgency x 4-7, frequency x 4-7, is restricting fluids to avoid visits to the restroom, is engaging in toilet mapping, incontinence x 0-3 and nocturia x 4-7. Her BP is 125/82.   Her PVR is 0 mL.    She has sleep apnea and sleeps with her CPAP machine.    Patient denies any gross hematuria, dysuria or suprapubic/flank pain.  Patient denies any fevers, chills, nausea or vomiting.   PMH: Past Medical History:  Diagnosis Date  . Cervical spondylosis   . Chronic kidney disease    Stage 3  . DDD (degenerative disc disease), cervical    [facility]  . Diabetes mellitus without complication ([place])   . Diverticulosis   . Hyperlipidemia   . Hypertension   . Intertrigo   . Leg cramps   . Leg varices   . Obesity   . OSA (obstructive sleep apnea)   . Symptomatic menopausal or female climacteric states     Surgical History: Past Surgical History:  Procedure Laterality Date  . ABDOMINAL HYSTERECTOMY  1993   Total  . BUNIONECTOMY Bilateral 1993  . I&D KNEE WITH POLY EXCHANGE Right 11/19/2017   Procedure: RIGHT KNEE POLY EXCHANGE WITH IRRIGATION AND DEBRIDEMENT;  Surgeon: Hessie Knows, MD;  Location: [facility] ORS;  Service: Orthopedics;  Laterality: Right;  . JOINT REPLACEMENT     bilateral knee  . LAMINECTOMY  11/14/2013    Cervical Fusion , [facility], Dr. Delilah Shan  . LASER ABLATION Bilateral 07/29/2012   Dr. Lucky Cowboy  . REPLACEMENT TOTAL KNEE Left 03/2010   Dr. Rudene Christians  . TOTAL HIP ARTHROPLASTY Left 12/11/2015   Procedure: TOTAL HIP ARTHROPLASTY ANTERIOR APPROACH;  Surgeon: Hessie Knows, MD;  Location: [facility] ORS;  Service:  Orthopedics;  Laterality: Left;  . TOTAL KNEE ARTHROPLASTY Right 03/11/2016   Procedure: TOTAL KNEE ARTHROPLASTY;  Surgeon: Hessie Knows, MD;  Location: [facility] ORS;  Service: Orthopedics;  Laterality: Right;    Home Medications:  Allergies as of 05/06/2019      Reactions   Lisinopril Swelling      Medication List       Accurate as of May 06, 2019  9:24 AM. If you have any questions, ask your nurse or doctor.        acetaminophen 650 MG CR tablet Commonly known as: TYLENOL Take 650-1,300 mg by mouth 2 (two) times daily as needed for pain.   atorvastatin 40 MG tablet Commonly known as: LIPITOR Take 1 tablet (40 mg total) by mouth daily at 6 PM.   Calcium 600-D 600-400 MG-UNIT tablet Generic drug: Calcium Carbonate-Vitamin D Take 1 tablet by mouth 2 (two) times daily.   celecoxib 200 MG capsule Commonly known as: CELEBREX Take 200 mg by mouth daily as needed for moderate pain.   chlorhexidine 0.12 % solution Commonly known as: PERIDEX   CVS Lancing Device Misc 1 each.   doxycycline 100 MG EC tablet Commonly known as: DORYX Take 1 tablet (100 mg total) by mouth 2 (two) times daily.   Fifty50 Glucose Meter 2.0 w/Device Kit Use as directed.   fluticasone 50 MCG/ACT nasal spray Commonly known as:  FLONASE Place 2 sprays into both nostrils daily. What changed:   when to take this  reasons to take this   furosemide 20 MG tablet Commonly known as: LASIX Take 20 mg by mouth daily.   gabapentin 300 MG capsule Commonly known as: NEURONTIN Take 1 capsule (300 mg total) by mouth every evening.   glucose blood test strip Commonly known as: ONE TOUCH ULTRA TEST Use as instructed   imipramine 25 MG tablet Commonly known as: TOFRANIL Take 1 tablet (25 mg total) by mouth at bedtime. What changed: See the new instructions. Changed by: Zara Council, PA-C   ketoconazole 2 % cream Commonly known as: NIZORAL Apply 1 application topically 2 (two) times daily as  needed (rash).   Klor-Con 10 10 MEQ tablet Generic drug: potassium chloride Take 10 mEq by mouth daily.   latanoprost 0.005 % ophthalmic solution Commonly known as: XALATAN Place 1 drop into both eyes at bedtime.   Magnesium Oxide 500 MG Caps Take 500 mg by mouth daily.   Medical Compression Stockings Misc   metoprolol succinate 100 MG 24 hr tablet Commonly known as: TOPROL-XL Take 1 tablet (100 mg total) by mouth every evening. Take with or immediately following a meal.   mirabegron ER 25 MG Tb24 tablet Commonly known as: MYRBETRIQ Take 1 tablet (25 mg total) by mouth daily. Started by: Zara Council, PA-C   oxyCODONE 5 MG immediate release tablet Commonly known as: Oxy IR/ROXICODONE Take 1-2 tablets (5-10 mg total) by mouth every 3 (three) hours as needed for severe pain.   Rivaroxaban 15 & 20 MG Tbpk Take as directed on package: Start with one 20m tablet by mouth twice a day with food. On Day 22, switch to one 255mtablet once a day with food.   traMADol 50 MG tablet Commonly known as: Ultram Take 1 tablet (50 mg total) by mouth every 12 (twelve) hours as needed.   traZODone 50 MG tablet Commonly known as: DESYREL TAKE 1 TABLET BY MOUTH AT BEDTIME AS NEEDED FOR SLEEP       Allergies:  Allergies  Allergen Reactions  . Lisinopril Swelling    Family History: Family History  Problem Relation Age of Onset  . Diabetes Mother   . Hyperlipidemia Mother   . Hypertension Mother   . Diabetes Father   . Hyperlipidemia Father   . Hypertension Father   . Obesity Father   . Hypertension Sister   . Hyperlipidemia Sister   . Hyperlipidemia Sister   . Breast cancer Neg Hx     Social History:  reports that she has never smoked. She has never used smokeless tobacco. She reports that she does not drink alcohol or use drugs.  ROS: UROLOGY Frequent Urination?: No Hard to postpone urination?: No Burning/pain with urination?: No Get up at night to urinate?: No  Leakage of urine?: No Urine stream starts and stops?: No Trouble starting stream?: No Do you have to strain to urinate?: No Blood in urine?: No Urinary tract infection?: No Sexually transmitted disease?: No Injury to kidneys or bladder?: No Painful intercourse?: No Weak stream?: No Currently pregnant?: No Vaginal bleeding?: No Last menstrual period?: n  Gastrointestinal Nausea?: No Vomiting?: No Indigestion/heartburn?: No Diarrhea?: No Constipation?: No  Constitutional Fever: No Night sweats?: No Weight loss?: No Fatigue?: No  Skin Skin rash/lesions?: No Itching?: No  Eyes Blurred vision?: No Double vision?: No  Ears/Nose/Throat Sore throat?: No Sinus problems?: No  Hematologic/Lymphatic Swollen glands?: No Easy bruising?: No  Cardiovascular Leg swelling?: Yes Chest pain?: No  Respiratory Cough?: No Shortness of breath?: No  Endocrine Excessive thirst?: No  Musculoskeletal Back pain?: No Joint pain?: Yes  Neurological Headaches?: No Dizziness?: No  Psychologic Depression?: No Anxiety?: No  Physical Exam: BP 125/82 (BP Location: Left Arm, Patient Position: Sitting, Cuff Size: Normal)   Pulse 71   Ht _0  (1.803 m)   Wt 254 lb (115.2 kg)   BMI 35.43 kg/m   Constitutional:  Well nourished. Alert and oriented, No acute distress. HEENT: [place] AT, moist mucus membranes.  Trachea midline, no masses. Cardiovascular: No clubbing, cyanosis, or edema. Respiratory: Normal respiratory effort, no increased work of breathing. GI: Abdomen is soft, non tender, non distended, no abdominal masses. Liver and spleen not palpable.  No hernias appreciated.  Stool sample for occult testing is not indicated.   GU: No CVA tenderness.  No bladder fullness or masses.  atriphic external genitalia, sparse pubic hair distribution, no lesions.  Normal urethral meatus, no lesions, no prolapse, no discharge.   No urethral masses, tenderness and/or tenderness. No bladder  fullness, tenderness or masses. Pale vagina mucosa, fair estrogen effect, no discharge, no lesions, poor pelvic support, grade III cystocele and grade III rectocele noted.  Cervix, uterus and adnexa are surgically absent.  Anus and perineum are without rashes or lesions.    Skin: No rashes, bruises or suspicious lesions. Lymph: No inguinal adenopathy. Neurologic: Grossly intact, no focal deficits, moving all 4 extremities. Psychiatric: Normal mood and affect.   Laboratory Data: Lab Results  Component Value Date   WBC 5.9 04/29/2019   HGB 12.2 04/29/2019   HCT 39.0 04/29/2019   MCV 97.5 04/29/2019   PLT 192 04/29/2019    Lab Results  Component Value Date   CREATININE 1.09 (H) 04/29/2019    No results found for: PSA  No results found for: TESTOSTERONE  Lab Results  Component Value Date   HGBA1C 5.7 (A) 11/25/2018    No results found for: TSH     Component Value Date/Time   CHOL 148 11/25/2018 0823   CHOL 166 11/26/2015 1122   HDL 73 11/25/2018 0823   HDL 81 11/26/2015 1122   CHOLHDL 2.0 11/25/2018 0823   VLDL 9 11/06/2016 0913   LDLCALC 62 11/25/2018 0823    Lab Results  Component Value Date   AST 28 04/29/2019   Lab Results  Component Value Date   ALT 22 04/29/2019   No components found for: ALKALINEPHOPHATASE No components found for: BILIRUBINTOTAL  No results found for: ESTRADIOL I have reviewed the labs.   Pertinent Imaging: Results for RILYA, LONGO (MRN [id number]) as of 05/06/2019 09:14  Ref. Range 05/06/2019 09:09  Scan Result Unknown 0    Assessment & Plan:    1. Mixed incontinence Discuss behavioral therapies, bladder training and bladder control strategies Continue the imipramine as she feels that it is helping her reach her goals  2. Nocturia I have suggested that the patient avoid caffeine after noon and alcohol in the evening.  He or she may also benefit from fluid restrictions after 6:00 in the evening and voiding just prior to  bedtime. Continue to sleep with CPAP Will have a trial of Myrbetriq 25 mg daily - RTC in 3 weeks for OAB questionnaire and PVR    Return in about 3 weeks (around 05/27/2019) for PVR and OAB questionnaire.  These notes generated with voice recognition software. I apologize for typographical errors.  Macklen Wilhoite, PA-C  [address] [place], [place] [address] [phone]

## 2019-05-06 ENCOUNTER — Other Ambulatory Visit: Payer: Self-pay

## 2019-05-06 ENCOUNTER — Ambulatory Visit: Payer: BC Managed Care – PPO | Admitting: Urology

## 2019-05-06 ENCOUNTER — Encounter: Payer: Self-pay | Admitting: Urology

## 2019-05-06 VITALS — BP 125/82 | HR 71 | Ht 71.0 in | Wt 254.0 lb

## 2019-05-06 DIAGNOSIS — N3946 Mixed incontinence: Secondary | ICD-10-CM | POA: Diagnosis not present

## 2019-05-06 DIAGNOSIS — R351 Nocturia: Secondary | ICD-10-CM

## 2019-05-06 LAB — BLADDER SCAN AMB NON-IMAGING: Scan Result: 0

## 2019-05-06 MED ORDER — IMIPRAMINE HCL 25 MG PO TABS: 25.0000 mg | ORAL_TABLET | Freq: Every day | ORAL | 3 refills | Status: DC

## 2019-05-06 MED ORDER — MIRABEGRON ER 25 MG PO TB24: 25.0000 mg | ORAL_TABLET | Freq: Every day | ORAL | 0 refills | Status: DC

## 2019-05-09 DIAGNOSIS — Z96651 Presence of right artificial knee joint: Secondary | ICD-10-CM | POA: Diagnosis not present

## 2019-05-09 DIAGNOSIS — M2342 Loose body in knee, left knee: Secondary | ICD-10-CM | POA: Diagnosis not present

## 2019-05-12 ENCOUNTER — Other Ambulatory Visit: Payer: Self-pay

## 2019-05-12 ENCOUNTER — Encounter: Payer: Self-pay | Admitting: Family Medicine

## 2019-05-12 ENCOUNTER — Ambulatory Visit (INDEPENDENT_AMBULATORY_CARE_PROVIDER_SITE_OTHER): Payer: BC Managed Care – PPO | Admitting: Family Medicine

## 2019-05-12 VITALS — BP 130/82 | HR 94 | Temp 97.6°F | Resp 16 | Ht 71.0 in | Wt 253.7 lb

## 2019-05-12 DIAGNOSIS — E1140 Type 2 diabetes mellitus with diabetic neuropathy, unspecified: Secondary | ICD-10-CM

## 2019-05-12 DIAGNOSIS — I10 Essential (primary) hypertension: Secondary | ICD-10-CM

## 2019-05-12 DIAGNOSIS — Z23 Encounter for immunization: Secondary | ICD-10-CM | POA: Diagnosis not present

## 2019-05-12 DIAGNOSIS — M7121 Synovial cyst of popliteal space [Baker], right knee: Secondary | ICD-10-CM | POA: Diagnosis not present

## 2019-05-12 DIAGNOSIS — M009 Pyogenic arthritis, unspecified: Secondary | ICD-10-CM

## 2019-05-12 DIAGNOSIS — R600 Localized edema: Secondary | ICD-10-CM | POA: Diagnosis not present

## 2019-05-12 DIAGNOSIS — E785 Hyperlipidemia, unspecified: Secondary | ICD-10-CM

## 2019-05-12 DIAGNOSIS — R7989 Other specified abnormal findings of blood chemistry: Secondary | ICD-10-CM | POA: Diagnosis not present

## 2019-05-12 DIAGNOSIS — N183 Chronic kidney disease, stage 3 unspecified: Secondary | ICD-10-CM

## 2019-05-12 LAB — POCT GLYCOSYLATED HEMOGLOBIN (HGB A1C): HbA1c, POC (controlled diabetic range): 6 % (ref 0.0–7.0)

## 2019-05-12 MED ORDER — ATORVASTATIN CALCIUM 40 MG PO TABS: 40.0000 mg | ORAL_TABLET | Freq: Every day | ORAL | 1 refills | Status: DC

## 2019-05-12 NOTE — Progress Notes (Signed)
Name: Becky Trevino   MRN: [id number]    DOB: 01/01/1946   Date:05/12/2019       Progress Note  Subjective  Chief Complaint  Chief Complaint  Patient presents with  . Follow-up    ER for right leg blood clot    HPI   Elevated D-dimer: she went to Urgent care on 04/29/2019 because of right leg edema and pain on right popliteal fossa, d-dimer was very high and she was sent to [facility], she had a doppler US that was negative for DVT, but positive for possible thrombophlebitis. She was placed on Xarelto , we will check doppler aorta and iliac veins to make sure not a higher clot, and may need to repeat d-dimer, if negative for clot we will stop xarelto  DMII with renal manifestation and neuropathy: fsbs still below 120's most of the times. HbgA1C was 5.7% last visit and we will recheck it today. Urine microwasback to normal, not on ARB because of angiodema. Doing well, no polyphagia or polydipsia, sheused to have urinary frequency but controlled with medication given by Urologist. .She sees Podiatrist she has corns and neuropathy, taking gabapentin . On life style modification only. She states eye exam is up to date and we will obtain copy of results . She sees Dr. Gloriann Loan   Obesity Morbid: her weight has gone up a little to 260.6lbs.. She has DM, insulin resistance,sheis off Victoza, she states weight watcher and nutrisystem is working well for her weight is down to 253.7lbs  HTN:bp is at goal, no chest pain or palpitatoin   Hyperlipidemia: taking Atorvastatin daily and denies side effects. No chest pain , no muscles pain. Reviewed last labs with patient and recheck next visit   Insomnia/OSA : She is wearing CPAP and is doing well, she has not been taking Trazodone anymore and is sleeping better   OA: multi joints, sees Ortho, Dr. Rudene Christians. . She had revision of right knee replacement March 2019 because of infection. Still under the care of Ortho and also ID,she is still taking oral  antibiotics daily,  she denies redness or pain on right knee. She is going back to revision on the left this year. She is walking without a cane  Patient Active Problem List   Diagnosis Date Noted  . Chronic infection of right knee ([place]) 11/25/2018  . Infection and inflammatory reaction due to internal joint prosthesis, subsequent encounter 01/25/2018  . Infection of total right knee replacement ([place]) 11/19/2017  . Type 2 diabetes mellitus with diabetic neuropathy, without long-term current use of insulin ([place]) 11/06/2016  . Primary osteoarthritis of knee 03/11/2016  . Primary osteoarthritis of left hip 12/11/2015  . Benign essential HTN 04/21/2015  . Edema leg 04/21/2015  . Chronic kidney disease (CKD), stage III (moderate) ([place]) 04/21/2015  . Chronic venous insufficiency 04/21/2015  . DDD (degenerative disc disease), cervical 04/21/2015  . Diabetes mellitus with renal manifestation ([place]) 04/21/2015  . Dyslipidemia 04/21/2015  . Bladder cystocele 04/21/2015  . Urinary incontinence 04/21/2015  . Leg varices 04/21/2015  . Insomnia 04/21/2015  . Eczema intertrigo 04/21/2015  . Obesity, morbid ([place]) 04/21/2015  . Obstructive apnea 04/21/2015  . Menopausal and perimenopausal disorder 04/21/2015  . Supraventricular premature beats 04/21/2015  . Osteoarthritis of multiple joints 04/21/2015  . H/O Spinal surgery 11/14/2013  . Detrusor muscle hypertonia 11/07/2013    Past Surgical History:  Procedure Laterality Date  . ABDOMINAL HYSTERECTOMY  1993   Total  . BUNIONECTOMY Bilateral 1993  .  I&D KNEE WITH POLY EXCHANGE Right 11/19/2017   Procedure: RIGHT KNEE POLY EXCHANGE WITH IRRIGATION AND DEBRIDEMENT;  Surgeon: Hessie Knows, MD;  Location: [facility] ORS;  Service: Orthopedics;  Laterality: Right;  . JOINT REPLACEMENT     bilateral knee  . LAMINECTOMY  11/14/2013    Cervical Fusion , [facility], Dr. Delilah Shan  . LASER ABLATION Bilateral 07/29/2012   Dr. Lucky Cowboy  . REPLACEMENT TOTAL KNEE Left 03/2010    Dr. Rudene Christians  . TOTAL HIP ARTHROPLASTY Left 12/11/2015   Procedure: TOTAL HIP ARTHROPLASTY ANTERIOR APPROACH;  Surgeon: Hessie Knows, MD;  Location: [facility] ORS;  Service: Orthopedics;  Laterality: Left;  . TOTAL KNEE ARTHROPLASTY Right 03/11/2016   Procedure: TOTAL KNEE ARTHROPLASTY;  Surgeon: Hessie Knows, MD;  Location: [facility] ORS;  Service: Orthopedics;  Laterality: Right;    Family History  Problem Relation Age of Onset  . Diabetes Mother   . Hyperlipidemia Mother   . Hypertension Mother   . Diabetes Father   . Hyperlipidemia Father   . Hypertension Father   . Obesity Father   . Hypertension Sister   . Hyperlipidemia Sister   . Hyperlipidemia Sister   . Breast cancer Neg Hx     Social History   Socioeconomic History  . Marital status: Married    Spouse name: Not on file  . Number of children: 1  . Years of education: Not on file  . Highest education level: Not on file  Occupational History  . Not on file  Social Needs  . Financial resource strain: Not hard at all  . Food insecurity    Worry: Never true    Inability: Never true  . Transportation needs    Medical: No    Non-medical: No  Tobacco Use  . Smoking status: Never Smoker  . Smokeless tobacco: Never Used  Substance and Sexual Activity  . Alcohol use: No    Alcohol/week: 0.0 standard drinks  . Drug use: No  . Sexual activity: Yes    Partners: Male  Lifestyle  . Physical activity    Days per week: 3 days    Minutes per session: 30 min  . Stress: Not at all  Relationships  . Social connections    Talks on phone: More than three times a week    Gets together: More than three times a week    Attends religious service: More than 4 times per year    Active member of club or organization: No    Attends meetings of clubs or organizations: Never    Relationship status: Married  . Intimate partner violence    Fear of current or ex partner: No    Emotionally abused: No    Physically abused: No    Forced sexual  activity: No  Other Topics Concern  . Not on file  Social History Narrative   Married, one adopted grown child      Current Outpatient Medications:  .  acetaminophen (TYLENOL) 650 MG CR tablet, Take 650-1,300 mg by mouth 2 (two) times daily as needed for pain., Disp: , Rfl:  .  atorvastatin (LIPITOR) 40 MG tablet, Take 1 tablet (40 mg total) by mouth daily at 6 PM., Disp: 90 tablet, Rfl: 1 .  Blood Glucose Monitoring Suppl (FIFTY50 GLUCOSE METER 2.0) w/Device KIT, Use as directed., Disp: , Rfl:  .  Calcium Carbonate-Vitamin D (CALCIUM 600-D) 600-400 MG-UNIT per tablet, Take 1 tablet by mouth 2 (two) times daily., Disp: , Rfl:  .  celecoxib (  CELEBREX) 200 MG capsule, Take 200 mg by mouth daily as needed for moderate pain. , Disp: , Rfl: 11 .  chlorhexidine (PERIDEX) 0.12 % solution, , Disp: , Rfl:  .  doxycycline (DORYX) 100 MG EC tablet, Take 1 tablet (100 mg total) by mouth 2 (two) times daily., Disp: 60 tablet, Rfl: 3 .  Elastic Bandages & Supports (MEDICAL COMPRESSION STOCKINGS) MISC, , Disp: , Rfl:  .  fluticasone (FLONASE) 50 MCG/ACT nasal spray, Place 2 sprays into both nostrils daily. (Patient taking differently: Place 2 sprays into both nostrils daily as needed for allergies. ), Disp: 16 g, Rfl: 2 .  furosemide (LASIX) 20 MG tablet, Take 20 mg by mouth daily., Disp: , Rfl: 6 .  gabapentin (NEURONTIN) 300 MG capsule, Take 1 capsule (300 mg total) by mouth every evening., Disp: 90 capsule, Rfl: 2 .  glucose blood (ONE TOUCH ULTRA TEST) test strip, Use as instructed, Disp: 100 each, Rfl: 12 .  imipramine (TOFRANIL) 25 MG tablet, Take 1 tablet (25 mg total) by mouth at bedtime., Disp: 90 tablet, Rfl: 3 .  KLOR-CON 10 10 MEQ tablet, Take 10 mEq by mouth daily. , Disp: , Rfl: 3 .  Lancet Devices ([employer] LANCING DEVICE) MISC, 1 each., Disp: , Rfl:  .  latanoprost (XALATAN) 0.005 % ophthalmic solution, Place 1 drop into both eyes at bedtime. , Disp: , Rfl:  .  Magnesium Oxide 500 MG CAPS, Take  500 mg by mouth daily. , Disp: , Rfl:  .  metoprolol succinate (TOPROL-XL) 100 MG 24 hr tablet, Take 1 tablet (100 mg total) by mouth every evening. Take with or immediately following a meal., Disp: 90 tablet, Rfl: 1 .  mirabegron ER (MYRBETRIQ) 25 MG TB24 tablet, Take 1 tablet (25 mg total) by mouth daily., Disp: 28 tablet, Rfl: 0 .  oxyCODONE (OXY IR/ROXICODONE) 5 MG immediate release tablet, Take 1-2 tablets (5-10 mg total) by mouth every 3 (three) hours as needed for severe pain., Disp: 60 tablet, Rfl: 0 .  Rivaroxaban 15 & 20 MG TBPK, Take as directed on package: Start with one 67m tablet by mouth twice a day with food. On Day 22, switch to one 270mtablet once a day with food., Disp: 51 each, Rfl: 0 .  traMADol (ULTRAM) 50 MG tablet, Take 1 tablet (50 mg total) by mouth every 12 (twelve) hours as needed., Disp: 12 tablet, Rfl: 0 .  traZODone (DESYREL) 50 MG tablet, TAKE 1 TABLET BY MOUTH AT BEDTIME AS NEEDED FOR SLEEP, Disp: 90 tablet, Rfl: 1 .  ketoconazole (NIZORAL) 2 % cream, Apply 1 application topically 2 (two) times daily as needed (rash). , Disp: , Rfl:   Allergies  Allergen Reactions  . Lisinopril Swelling    I personally reviewed active problem list, medication list, allergies, family history, social history with the patient/caregiver today.   ROS  Ten systems reviewed and is negative except as mentioned in HPI   Objective  Vitals:   05/12/19 0929  BP: 130/82  Pulse: 94  Resp: 16  Temp: 97.6 F (36.4 C)  TempSrc: Oral  SpO2: 94%  Weight: 253 lb 11.2 oz (115.1 kg)  Height: 5' 11"  (1.803 m)    Body mass index is 35.38 kg/m.  Physical Exam  Constitutional: Patient appears well-developed and well-nourished. Obese No distress.  HEENT: head atraumatic, normocephalic, pupils equal and reactive to light,  neck supple Cardiovascular: Normal rate, regular rhythm and normal heart sounds.  No murmur heard. No BLE edema.  Pulmonary/Chest: Effort normal and breath sounds  normal. No respiratory distress. Abdominal: Soft.  There is no tenderness. Psychiatric: Patient has a normal mood and affect. behavior is normal. Judgment and thought content normal. Muscular Skeletal: right lower leg more swollen than left side, no calf tenderness, varicose on right inner thigh but no pain or inflammation   Recent Results (from the past 2160 hour(s))  Fibrin derivatives D-Dimer ([place] only)     Status: Abnormal   Collection Time: 04/29/19 11:19 AM  Result Value Ref Range   Fibrin derivatives D-dimer ([facility]) 3,542.59 (H) 0.00 - 499.00 ng/mL (FEU)    Comment: (NOTE) <> Exclusion of Venous Thromboembolism (VTE) - OUTPATIENT ONLY   (Emergency Department or [place])   0-499 ng/ml (FEU): With a low to intermediate pretest probability                      for VTE this test result excludes the diagnosis                      of VTE.   >499 ng/ml (FEU) : VTE not excluded; additional work up for VTE is                      required. <> Testing on Inpatients and Evaluation of Disseminated Intravascular   Coagulation ([facility]) Reference Range:   0-499 ng/ml (FEU) Performed at [facility], [address] [place], [place] [address]   Brain natriuretic peptide     Status: Abnormal   Collection Time: 04/29/19 11:19 AM  Result Value Ref Range   B Natriuretic Peptide 368.0 (H) 0.0 - 100.0 pg/mL    Comment: Performed at [facility], [address] [place], [place] [address]  Comprehensive metabolic panel     Status: Abnormal   Collection Time: 04/29/19  5:42 PM  Result Value Ref Range   Sodium 140 135 - 145 mmol/L   Potassium 4.0 3.5 - 5.1 mmol/L   Chloride 105 98 - 111 mmol/L   CO2 28 22 - 32 mmol/L   Glucose, Bld 87 70 - 99 mg/dL   BUN 21 8 - 23 mg/dL   Creatinine, Ser 1.09 (H) 0.44 - 1.00 mg/dL   Calcium 9.1 8.9 - 10.3 mg/dL   Total Protein 7.7 6.5 - 8.1 g/dL   Albumin 3.7 3.5 - 5.0 g/dL   AST 28 15 - 41 U/L   ALT 22 0 - 44 U/L   Alkaline Phosphatase 102  38 - 126 U/L   Total Bilirubin 0.6 0.3 - 1.2 mg/dL   GFR calc non Af Amer 50 (L) >60 mL/min   GFR calc Af Amer 58 (L) >60 mL/min   Anion gap 7 5 - 15    Comment: Performed at [facility], [address] [place], [place] [address]  CBC with Differential     Status: None   Collection Time: 04/29/19  5:42 PM  Result Value Ref Range   WBC 5.9 4.0 - 10.5 K/uL   RBC 4.00 3.87 - 5.11 MIL/uL   Hemoglobin 12.2 12.0 - 15.0 g/dL   HCT 39.0 36.0 - 46.0 %   MCV 97.5 80.0 - 100.0 fL   MCH 30.5 26.0 - 34.0 pg   MCHC 31.3 30.0 - 36.0 g/dL   RDW 15.4 11.5 - 15.5 %   Platelets 192 150 - 400 K/uL   nRBC 0.0 0.0 - 0.2 %   Neutrophils Relative %  59 %   Neutro Abs 3.4 1.7 - 7.7 K/uL   Lymphocytes Relative 27 %   Lymphs Abs 1.6 0.7 - 4.0 K/uL   Monocytes Relative 9 %   Monocytes Absolute 0.5 0.1 - 1.0 K/uL   Eosinophils Relative 4 %   Eosinophils Absolute 0.3 0.0 - 0.5 K/uL   Basophils Relative 1 %   Basophils Absolute 0.0 0.0 - 0.1 K/uL   Immature Granulocytes 0 %   Abs Immature Granulocytes 0.02 0.00 - 0.07 K/uL    Comment: Performed at [facility], [address] [place], [place] [address]  Bladder Scan (Post Void Residual) in office     Status: None   Collection Time: 05/06/19  9:09 AM  Result Value Ref Range   Scan Result 0     Diabetic Foot Exam: Diabetic Foot Exam - Simple   Simple Foot Form Diabetic Foot exam was performed with the following findings: Yes 05/12/2019 10:07 AM  Visual Inspection See comments: Yes Sensation Testing Intact to touch and monofilament testing bilaterally: Yes Pulse Check Posterior Tibialis and Dorsalis pulse intact bilaterally: Yes Comments Bunion, corn formation       PHQ2/9: Depression screen [facility] 2/9 05/12/2019 11/25/2018 07/27/2018 03/25/2018 06/09/2017  Decreased Interest 0 0 0 0 0  Down, Depressed, Hopeless 0 0 0 0 0  PHQ - 2 Score 0 0 0 0 0  Altered sleeping 0 0 0 0 -  Tired, decreased energy 0 0 0 0 -  Change in  appetite 0 0 1 0 -  Feeling bad or failure about yourself  0 0 0 0 -  Trouble concentrating 0 0 1 0 -  Moving slowly or fidgety/restless 0 0 0 0 -  Suicidal thoughts 0 - 0 0 -  PHQ-9 Score 0 0 2 0 -  Difficult doing work/chores Not difficult at all - Not difficult at all Not difficult at all -    phq 9 is negative   Fall Risk: Fall Risk  05/12/2019 11/25/2018 07/27/2018 03/25/2018 09/03/2017  Falls in the past year? 0 1 0 No No  Number falls in past yr: 0 1 0 - -  Comment - - - - -  Injury with Fall? 0 0 0 - -  Follow up Falls evaluation completed - - - -     Assessment & Plan  1. Needs flu shot  - Flu Vaccine QUAD High Dose(Fluad)  2. Type 2 diabetes mellitus with diabetic neuropathy, without long-term current use of insulin (HCC)  - POCT glycosylated hemoglobin (Hb A1C)  3. Elevated d-dimer  - US AORTA/ILIAC DOPPLER COMPLETE; Future  4. Leg edema, right  - US AORTA/ILIAC DOPPLER COMPLETE; Future  5. Baker cyst, right  Continue follow up with Dr. Rudene Christians   6. Benign essential HTN  At goal   7. Morbid obesity, unspecified obesity type [facility])  Discussed with the patient the risk posed by an increased BMI. Discussed importance of portion control, calorie counting and at least 150 minutes of physical activity weekly. Avoid sweet beverages and drink more water. Eat at least 6 servings of fruit and vegetables daily   8. Dyslipidemia  - atorvastatin (LIPITOR) 40 MG tablet; Take 1 tablet (40 mg total) by mouth daily at 6 PM.  Dispense: 90 tablet; Refill: 1  9. Chronic infection of right knee [facility])  Under the care of ID - Dr. Ola Spurr and also Dr. Rudene Christians   10. Chronic kidney disease (CKD), stage III (moderate) (HCC)  Last  GFR has been stable

## 2019-05-16 NOTE — Addendum Note (Signed)
Addended by: Marland Kitchen A on: 05/16/2019 11:14 AM   Modules accepted: Orders

## 2019-05-21 ENCOUNTER — Other Ambulatory Visit: Payer: Self-pay | Admitting: Family Medicine

## 2019-05-21 DIAGNOSIS — I10 Essential (primary) hypertension: Secondary | ICD-10-CM

## 2019-05-24 DIAGNOSIS — M25561 Pain in right knee: Secondary | ICD-10-CM | POA: Diagnosis not present

## 2019-05-24 DIAGNOSIS — M7121 Synovial cyst of popliteal space [Baker], right knee: Secondary | ICD-10-CM | POA: Diagnosis not present

## 2019-05-24 DIAGNOSIS — M25461 Effusion, right knee: Secondary | ICD-10-CM | POA: Diagnosis not present

## 2019-05-25 ENCOUNTER — Other Ambulatory Visit: Payer: Self-pay | Admitting: Family Medicine

## 2019-05-25 ENCOUNTER — Telehealth: Payer: Self-pay | Admitting: Family Medicine

## 2019-05-25 MED ORDER — RIVAROXABAN 20 MG PO TABS: 20.0000 mg | ORAL_TABLET | Freq: Every day | ORAL | 0 refills | Status: DC

## 2019-05-25 NOTE — Telephone Encounter (Signed)
Patient called in to see if any status has been made. Please advise as she is wanting call today.

## 2019-05-25 NOTE — Telephone Encounter (Signed)
Pt states that she was put on Xarelto at the ER and wants to know if this is something that she needs to stay on and if so should she come off of the Asprin. If pt needs to stay on Xarelto she will need a refill.

## 2019-05-26 ENCOUNTER — Other Ambulatory Visit: Payer: Self-pay

## 2019-05-26 NOTE — Telephone Encounter (Signed)
I tried to reach this patient to inform her that Dr. Ancil Boozer has consulted with a radiologist and he recommends that they do not proceed with any additional imaging at this time but to continue with the medication Xarelto for the next 3 months and then they will go forward after that but there was no answer.  A message was left for her with that information on there and to let her know a new rx was sent in to her pharmacy.

## 2019-05-27 ENCOUNTER — Telehealth: Payer: Self-pay

## 2019-05-27 NOTE — Telephone Encounter (Signed)
Copied from [place] [phone]. Topic: General - Other >> May 27, 2019 10:40 AM Carolyn Stare wrote: Pt cal lto say she is taking Manus Rudd and is asking if she should still take the aspirin also

## 2019-05-27 NOTE — Telephone Encounter (Signed)
Patient notified

## 2019-05-30 NOTE — Telephone Encounter (Signed)
Patient called.  Patient aware.  

## 2019-06-04 ENCOUNTER — Other Ambulatory Visit: Payer: Self-pay | Admitting: Urology

## 2019-06-06 DIAGNOSIS — I87009 Postthrombotic syndrome without complications of unspecified extremity: Secondary | ICD-10-CM | POA: Diagnosis not present

## 2019-06-06 NOTE — Progress Notes (Signed)
06/07/2019 10:39 AM   Becky Trevino 1946-04-06 [id number]  Referring provider: [name], MD [address] [place],  [place] [address]  Chief Complaint  Patient presents with  . Urinary Incontinence    HPI: Patient is a 73 year old female with mixed urinary incontinence who presents today for a three week follow up after a trial of Myrbetriq.     The patient is  experiencing urgency x 0-3 (improved), frequency x 0-3 (improved), is restricting fluids to avoid visits to the restroom, is engaging in toilet mapping, incontinence x 0-3 (stable) and nocturia x 0-3 (improved). Her BP is 126/79.   Her PVR is 0 mL.    She has sleep apnea and sleeps with her CPAP machine.    Patient denies any gross hematuria, dysuria or suprapubic/flank pain.  Patient denies any fevers, chills, nausea or vomiting.   PMH: Past Medical History:  Diagnosis Date  . Cervical spondylosis   . Chronic kidney disease    Stage 3  . DDD (degenerative disc disease), cervical    [facility]  . Diabetes mellitus without complication ([place])   . Diverticulosis   . Hyperlipidemia   . Hypertension   . Intertrigo   . Leg cramps   . Leg varices   . Obesity   . OSA (obstructive sleep apnea)   . Symptomatic menopausal or female climacteric states     Surgical History: Past Surgical History:  Procedure Laterality Date  . ABDOMINAL HYSTERECTOMY  1993   Total  . BUNIONECTOMY Bilateral 1993  . I&D KNEE WITH POLY EXCHANGE Right 11/19/2017   Procedure: RIGHT KNEE POLY EXCHANGE WITH IRRIGATION AND DEBRIDEMENT;  Surgeon: Hessie Knows, MD;  Location: [facility] ORS;  Service: Orthopedics;  Laterality: Right;  . JOINT REPLACEMENT     bilateral knee  . LAMINECTOMY  11/14/2013    Cervical Fusion , [facility], Dr. Delilah Shan  . LASER ABLATION Bilateral 07/29/2012   Dr. Lucky Cowboy  . REPLACEMENT TOTAL KNEE Left 03/2010   Dr. Rudene Christians  . TOTAL HIP ARTHROPLASTY Left 12/11/2015   Procedure: TOTAL HIP ARTHROPLASTY ANTERIOR  APPROACH;  Surgeon: Hessie Knows, MD;  Location: [facility] OR[facility];  Service: Orthopedics;  Laterality: Left;  . TOTAL KNEE ARTHROPLASTY Right 03/11/2016   Procedure: TOTAL KNEE ARTHROPLASTY;  Surgeon: Hessie Knows, MD;  Location: [facility] OR[facility];  Service: Orthopedics;  Laterality: Right;    Home Medications:  Allergies as of 06/07/2019      Reactions   Lisinopril Swelling      Medication List       Accurate as of June 07, 2019 10:39 AM. If you have any questions, ask your nurse or doctor.        acetaminophen 650 MG CR tablet Commonly known as: TYLENOL Take 650-1,300 mg by mouth 2 (two) times daily as needed for pain.   atorvastatin 40 MG tablet Commonly known as: LIPITOR Take 1 tablet (40 mg total) by mouth daily at 6 PM.   Calcium 600-D 600-400 MG-UNIT tablet Generic drug: Calcium Carbonate-Vitamin D Take 1 tablet by mouth 2 (two) times daily.   celecoxib 200 MG capsule Commonly known as: CELEBREX Take 200 mg by mouth daily as needed for moderate pain.   chlorhexidine 0.12 % solution Commonly known as: PERIDEX   [employer] Lancing Device Misc 1 each.   doxycycline 100 MG EC tablet Commonly known as: DORYX Take 1 tablet (100 mg total) by mouth 2 (two) times daily.   Fifty50 Glucose Meter 2.0 w/Device Kit Use as  directed.   fluticasone 50 MCG/ACT nasal spray Commonly known as: FLONASE Place 2 sprays into both nostrils daily. What changed:   when to take this  reasons to take this   furosemide 20 MG tablet Commonly known as: LASIX Take 20 mg by mouth daily.   gabapentin 300 MG capsule Commonly known as: NEURONTIN Take 1 capsule (300 mg total) by mouth every evening.   glucose blood test strip Commonly known as: ONE TOUCH ULTRA TEST Use as instructed   imipramine 25 MG tablet Commonly known as: TOFRANIL Take 1 tablet (25 mg total) by mouth at bedtime.   ketoconazole 2 % cream Commonly known as: NIZORAL Apply 1 application topically 2 (two) times daily as needed  (rash).   Klor-Con 10 10 MEQ tablet Generic drug: potassium chloride Take 10 mEq by mouth daily.   latanoprost 0.005 % ophthalmic solution Commonly known as: XALATAN Place 1 drop into both eyes at bedtime.   Magnesium Oxide 500 MG Caps Take 500 mg by mouth daily.   Medical Compression Stockings Misc   metoprolol succinate 100 MG 24 hr tablet Commonly known as: TOPROL-XL TAKE 1 TABLET (100 MG TOTAL) BY MOUTH EVERY EVENING. TAKE WITH OR IMMEDIATELY FOLLOWING A MEAL.   mirabegron ER 25 MG Tb24 tablet Commonly known as: Myrbetriq Take 1 tablet (25 mg total) by mouth daily. What changed: how much to take Changed by: Breyana Follansbee, PA-C   oxyCODONE 5 MG immediate release tablet Commonly known as: Oxy IR/ROXICODONE Take 1-2 tablets (5-10 mg total) by mouth every 3 (three) hours as needed for severe pain.   rivaroxaban 20 MG Tabs tablet Commonly known as: Xarelto Take 1 tablet (20 mg total) by mouth daily with supper.   traZODone 50 MG tablet Commonly known as: DESYREL TAKE 1 TABLET BY MOUTH AT BEDTIME AS NEEDED FOR SLEEP       Allergies:  Allergies  Allergen Reactions  . Lisinopril Swelling    Family History: Family History  Problem Relation Age of Onset  . Diabetes Mother   . Hyperlipidemia Mother   . Hypertension Mother   . Diabetes Father   . Hyperlipidemia Father   . Hypertension Father   . Obesity Father   . Hypertension Sister   . Hyperlipidemia Sister   . Hyperlipidemia Sister   . Breast cancer Neg Hx     Social History:  reports that she has never smoked. She has never used smokeless tobacco. She reports that she does not drink alcohol or use drugs.  ROS: UROLOGY Frequent Urination?: No Hard to postpone urination?: No Burning/pain with urination?: No Get up at night to urinate?: No Leakage of urine?: No Urine stream starts and stops?: No Trouble starting stream?: No Do you have to strain to urinate?: No Blood in urine?: No Urinary tract  infection?: No Sexually transmitted disease?: No Injury to kidneys or bladder?: No Painful intercourse?: No Weak stream?: No Currently pregnant?: No Vaginal bleeding?: No Last menstrual period?: n  Gastrointestinal Nausea?: No Vomiting?: No Indigestion/heartburn?: No Diarrhea?: No Constipation?: No  Constitutional Fever: No Night sweats?: No Weight loss?: No Fatigue?: No  Skin Skin rash/lesions?: No Itching?: No  Eyes Blurred vision?: No Double vision?: No  Ears/Nose/Throat Sore throat?: No Sinus problems?: No  Hematologic/Lymphatic Swollen glands?: No Easy bruising?: No  Cardiovascular Leg swelling?: Yes Chest pain?: No  Respiratory Cough?: No Shortness of breath?: No  Endocrine Excessive thirst?: No  Musculoskeletal Back pain?: No Joint pain?: Yes  Neurological Headaches?: No Dizziness?: No  Psychologic Depression?: No Anxiety?: No  Physical Exam: BP 126/79 (BP Location: Left Arm, Patient Position: Sitting, Cuff Size: Normal)   Pulse 74   Ht 5' 11"  (1.803 m)   Wt 265 lb (120.2 kg)   BMI 36.96 kg/m   Constitutional:  Well nourished. Alert and oriented, No acute distress. HEENT: [place] AT, moist mucus membranes.  Trachea midline, no masses. Cardiovascular: No clubbing, cyanosis, or edema. Respiratory: Normal respiratory effort, no increased work of breathing. Neurologic: Grossly intact, no focal deficits, moving all 4 extremities. Psychiatric: Normal mood and affect.   Laboratory Data: Lab Results  Component Value Date   WBC 5.9 04/29/2019   HGB 12.2 04/29/2019   HCT 39.0 04/29/2019   MCV 97.5 04/29/2019   PLT 192 04/29/2019    Lab Results  Component Value Date   CREATININE 1.09 (H) 04/29/2019    No results found for: PSA  No results found for: TESTOSTERONE  Lab Results  Component Value Date   HGBA1C 6.0 05/12/2019    No results found for: TSH     Component Value Date/Time   CHOL 148 11/25/2018 0823   CHOL 166  11/26/2015 1122   HDL 73 11/25/2018 0823   HDL 81 11/26/2015 1122   CHOLHDL 2.0 11/25/2018 0823   VLDL 9 11/06/2016 0913   LDLCALC 62 11/25/2018 0823    Lab Results  Component Value Date   AST 28 04/29/2019   Lab Results  Component Value Date   ALT 22 04/29/2019   No components found for: ALKALINEPHOPHATASE No components found for: BILIRUBINTOTAL  No results found for: ESTRADIOL I have reviewed the labs.   Pertinent Imaging: Results for CASHAE, WEICH (MRN [id number]) as of 06/07/2019 10:41  Ref. Range 06/07/2019 10:26  Scan Result Unknown 0     Assessment & Plan:    1. Mixed incontinence At goal Continue Tofranil 25 mg and Myrbetriq 25 mg daily - refills given RTC in one year for OAB questionnaire and PVR  2. Nocturia At goal Continue CPAP, Tofranil 25 mg and Myrbetriq 25 mg - refills given RTC in one year for OAB questionnaire and PVR   Return in about 1 year (around 06/06/2020) for PVR and OAB questionnaire.  These notes generated with voice recognition software. I apologize for typographical errors.  Zara Council, PA-C  [facility] [address] [place], [place] [address] [phone]

## 2019-06-07 ENCOUNTER — Ambulatory Visit (INDEPENDENT_AMBULATORY_CARE_PROVIDER_SITE_OTHER): Payer: BC Managed Care – PPO | Admitting: Urology

## 2019-06-07 ENCOUNTER — Encounter: Payer: Self-pay | Admitting: Urology

## 2019-06-07 ENCOUNTER — Other Ambulatory Visit: Payer: Self-pay

## 2019-06-07 VITALS — BP 126/79 | HR 74 | Ht 71.0 in | Wt 265.0 lb

## 2019-06-07 DIAGNOSIS — R351 Nocturia: Secondary | ICD-10-CM | POA: Diagnosis not present

## 2019-06-07 DIAGNOSIS — N3946 Mixed incontinence: Secondary | ICD-10-CM

## 2019-06-07 LAB — BLADDER SCAN AMB NON-IMAGING: Scan Result: 0

## 2019-06-07 MED ORDER — MIRABEGRON ER 25 MG PO TB24: 25.0000 mg | ORAL_TABLET | Freq: Every day | ORAL | 3 refills | Status: DC

## 2019-06-19 ENCOUNTER — Other Ambulatory Visit: Payer: Self-pay | Admitting: Family Medicine

## 2019-06-20 DIAGNOSIS — I87009 Postthrombotic syndrome without complications of unspecified extremity: Secondary | ICD-10-CM | POA: Diagnosis not present

## 2019-07-13 DIAGNOSIS — B351 Tinea unguium: Secondary | ICD-10-CM | POA: Diagnosis not present

## 2019-07-13 DIAGNOSIS — L851 Acquired keratosis [keratoderma] palmaris et plantaris: Secondary | ICD-10-CM | POA: Diagnosis not present

## 2019-07-13 DIAGNOSIS — E1142 Type 2 diabetes mellitus with diabetic polyneuropathy: Secondary | ICD-10-CM | POA: Diagnosis not present

## 2019-07-13 DIAGNOSIS — G4733 Obstructive sleep apnea (adult) (pediatric): Secondary | ICD-10-CM | POA: Diagnosis not present

## 2019-07-21 ENCOUNTER — Telehealth: Payer: Self-pay | Admitting: Family Medicine

## 2019-07-21 NOTE — Chronic Care Management (AMB) (Signed)
Chronic Care Management   Note  07/21/2019 Name: Becky Trevino MRN: [id number] DOB: [birth year]  Becky Trevino is a 73 y.o. year old female who is a primary care patient of [name], MD. I reached out to [name] by phone today in response to a referral sent by Ms. [employer] health plan.     [name] was given information about Chronic Care Management services today including:  1. CCM service includes personalized support from designated clinical staff supervised by her physician, including individualized plan of care and coordination with other care providers 2. 24/7 contact phone numbers for assistance for urgent and routine care needs. 3. Service will only be billed when office clinical staff spend 20 minutes or more in a month to coordinate care. 4. Only one practitioner may furnish and bill the service in a calendar month. 5. The patient may stop CCM services at any time (effective at the end of the month) by phone call to the office staff. 6. The patient will be responsible for cost sharing (co-pay) of up to 20% of the service fee (after annual deductible is met).  Patient did not agree to enrollment in care management services and does not wish to consider at this time.  Follow up plan: The patient has been provided with contact information for the chronic care management team and has been advised to call with any health related questions or concerns.   [place]  ??[email or web address]   ??[phone]

## 2019-07-27 ENCOUNTER — Other Ambulatory Visit: Payer: Self-pay | Admitting: Family Medicine

## 2019-08-07 ENCOUNTER — Emergency Department: Payer: BC Managed Care – PPO

## 2019-08-07 ENCOUNTER — Encounter: Payer: Self-pay | Admitting: Emergency Medicine

## 2019-08-07 ENCOUNTER — Other Ambulatory Visit: Payer: Self-pay

## 2019-08-07 ENCOUNTER — Inpatient Hospital Stay: Payer: BC Managed Care – PPO

## 2019-08-07 ENCOUNTER — Inpatient Hospital Stay
Admission: EM | Admit: 2019-08-07 | Discharge: 2019-08-09 | DRG: 312 | Disposition: A | Discharge status: Home Health | Payer: BC Managed Care – PPO | Attending: Internal Medicine | Admitting: Internal Medicine

## 2019-08-07 DIAGNOSIS — I951 Orthostatic hypotension: Secondary | ICD-10-CM | POA: Diagnosis not present

## 2019-08-07 DIAGNOSIS — Z981 Arthrodesis status: Secondary | ICD-10-CM

## 2019-08-07 DIAGNOSIS — Z8349 Family history of other endocrine, nutritional and metabolic diseases: Secondary | ICD-10-CM | POA: Diagnosis not present

## 2019-08-07 DIAGNOSIS — W1839XA Other fall on same level, initial encounter: Secondary | ICD-10-CM | POA: Diagnosis present

## 2019-08-07 DIAGNOSIS — S300XXA Contusion of lower back and pelvis, initial encounter: Secondary | ICD-10-CM | POA: Diagnosis present

## 2019-08-07 DIAGNOSIS — I129 Hypertensive chronic kidney disease with stage 1 through stage 4 chronic kidney disease, or unspecified chronic kidney disease: Secondary | ICD-10-CM | POA: Diagnosis present

## 2019-08-07 DIAGNOSIS — Z791 Long term (current) use of non-steroidal anti-inflammatories (NSAID): Secondary | ICD-10-CM

## 2019-08-07 DIAGNOSIS — E1169 Type 2 diabetes mellitus with other specified complication: Secondary | ICD-10-CM | POA: Diagnosis present

## 2019-08-07 DIAGNOSIS — E785 Hyperlipidemia, unspecified: Secondary | ICD-10-CM | POA: Diagnosis present

## 2019-08-07 DIAGNOSIS — Z79899 Other long term (current) drug therapy: Secondary | ICD-10-CM

## 2019-08-07 DIAGNOSIS — I10 Essential (primary) hypertension: Secondary | ICD-10-CM | POA: Diagnosis present

## 2019-08-07 DIAGNOSIS — Z96653 Presence of artificial knee joint, bilateral: Secondary | ICD-10-CM | POA: Diagnosis not present

## 2019-08-07 DIAGNOSIS — N1832 Chronic kidney disease, stage 3b: Secondary | ICD-10-CM | POA: Diagnosis present

## 2019-08-07 DIAGNOSIS — D649 Anemia, unspecified: Secondary | ICD-10-CM | POA: Diagnosis present

## 2019-08-07 DIAGNOSIS — Z7901 Long term (current) use of anticoagulants: Secondary | ICD-10-CM | POA: Diagnosis not present

## 2019-08-07 DIAGNOSIS — E1140 Type 2 diabetes mellitus with diabetic neuropathy, unspecified: Secondary | ICD-10-CM | POA: Diagnosis not present

## 2019-08-07 DIAGNOSIS — E1122 Type 2 diabetes mellitus with diabetic chronic kidney disease: Secondary | ICD-10-CM | POA: Diagnosis present

## 2019-08-07 DIAGNOSIS — Z86718 Personal history of other venous thrombosis and embolism: Secondary | ICD-10-CM

## 2019-08-07 DIAGNOSIS — Z8249 Family history of ischemic heart disease and other diseases of the circulatory system: Secondary | ICD-10-CM

## 2019-08-07 DIAGNOSIS — Z833 Family history of diabetes mellitus: Secondary | ICD-10-CM | POA: Diagnosis not present

## 2019-08-07 DIAGNOSIS — G4733 Obstructive sleep apnea (adult) (pediatric): Secondary | ICD-10-CM | POA: Diagnosis present

## 2019-08-07 DIAGNOSIS — Z20828 Contact with and (suspected) exposure to other viral communicable diseases: Secondary | ICD-10-CM | POA: Diagnosis present

## 2019-08-07 DIAGNOSIS — R55 Syncope and collapse: Secondary | ICD-10-CM | POA: Diagnosis present

## 2019-08-07 DIAGNOSIS — N183 Chronic kidney disease, stage 3 unspecified: Secondary | ICD-10-CM | POA: Diagnosis present

## 2019-08-07 DIAGNOSIS — T148XXA Other injury of unspecified body region, initial encounter: Secondary | ICD-10-CM | POA: Diagnosis not present

## 2019-08-07 DIAGNOSIS — Z888 Allergy status to other drugs, medicaments and biological substances status: Secondary | ICD-10-CM

## 2019-08-07 DIAGNOSIS — N179 Acute kidney failure, unspecified: Secondary | ICD-10-CM | POA: Diagnosis not present

## 2019-08-07 DIAGNOSIS — D509 Iron deficiency anemia, unspecified: Secondary | ICD-10-CM | POA: Diagnosis present

## 2019-08-07 DIAGNOSIS — Z90710 Acquired absence of both cervix and uterus: Secondary | ICD-10-CM | POA: Diagnosis not present

## 2019-08-07 DIAGNOSIS — S0003XD Contusion of scalp, subsequent encounter: Secondary | ICD-10-CM

## 2019-08-07 LAB — CBC WITH DIFFERENTIAL/PLATELET
Abs Immature Granulocytes: 0.02 10*3/uL (ref 0.00–0.07)
Basophils Absolute: 0 10*3/uL (ref 0.0–0.1)
Basophils Relative: 0 %
Eosinophils Absolute: 0.2 10*3/uL (ref 0.0–0.5)
Eosinophils Relative: 3 %
HCT: 29.9 % — ABNORMAL LOW (ref 36.0–46.0)
Hemoglobin: 9.5 g/dL — ABNORMAL LOW (ref 12.0–15.0)
Immature Granulocytes: 0 %
Lymphocytes Relative: 18 %
Lymphs Abs: 1.2 10*3/uL (ref 0.7–4.0)
MCH: 30.8 pg (ref 26.0–34.0)
MCHC: 31.8 g/dL (ref 30.0–36.0)
MCV: 97.1 fL (ref 80.0–100.0)
Monocytes Absolute: 0.4 10*3/uL (ref 0.1–1.0)
Monocytes Relative: 6 %
Neutro Abs: 4.8 10*3/uL (ref 1.7–7.7)
Neutrophils Relative %: 73 %
Platelets: 188 10*3/uL (ref 150–400)
RBC: 3.08 MIL/uL — ABNORMAL LOW (ref 3.87–5.11)
RDW: 16 % — ABNORMAL HIGH (ref 11.5–15.5)
WBC: 6.7 10*3/uL (ref 4.0–10.5)
nRBC: 0 % (ref 0.0–0.2)

## 2019-08-07 LAB — HEMOGLOBIN: Hemoglobin: 8.8 g/dL — ABNORMAL LOW (ref 12.0–15.0)

## 2019-08-07 LAB — RETICULOCYTES
Immature Retic Fract: 29.7 % — ABNORMAL HIGH (ref 2.3–15.9)
RBC.: 2.88 MIL/uL — ABNORMAL LOW (ref 3.87–5.11)
Retic Count, Absolute: 61.1 10*3/uL (ref 19.0–186.0)
Retic Ct Pct: 2.1 % (ref 0.4–3.1)

## 2019-08-07 LAB — COMPREHENSIVE METABOLIC PANEL
ALT: 16 U/L (ref 0–44)
AST: 22 U/L (ref 15–41)
Albumin: 3.4 g/dL — ABNORMAL LOW (ref 3.5–5.0)
Alkaline Phosphatase: 97 U/L (ref 38–126)
Anion gap: 9 (ref 5–15)
BUN: 27 mg/dL — ABNORMAL HIGH (ref 8–23)
CO2: 25 mmol/L (ref 22–32)
Calcium: 8.6 mg/dL — ABNORMAL LOW (ref 8.9–10.3)
Chloride: 106 mmol/L (ref 98–111)
Creatinine, Ser: 1.43 mg/dL — ABNORMAL HIGH (ref 0.44–1.00)
GFR calc Af Amer: 42 mL/min — ABNORMAL LOW (ref >60)
GFR calc non Af Amer: 36 mL/min — ABNORMAL LOW (ref >60)
Glucose, Bld: 113 mg/dL — ABNORMAL HIGH (ref 70–99)
Potassium: 4.4 mmol/L (ref 3.5–5.1)
Sodium: 140 mmol/L (ref 135–145)
Total Bilirubin: 0.7 mg/dL (ref 0.3–1.2)
Total Protein: 6.8 g/dL (ref 6.5–8.1)

## 2019-08-07 LAB — TROPONIN I (HIGH SENSITIVITY)
Troponin I (High Sensitivity): 5 ng/L (ref <18)
Troponin I (High Sensitivity): 4 ng/L (ref <18)

## 2019-08-07 LAB — FERRITIN: Ferritin: 66 ng/mL (ref 11–307)

## 2019-08-07 LAB — MAGNESIUM: Magnesium: 2.4 mg/dL (ref 1.7–2.4)

## 2019-08-07 LAB — T4, FREE: Free T4: 0.75 ng/dL (ref 0.61–1.12)

## 2019-08-07 LAB — TSH: TSH: 2.837 u[IU]/mL (ref 0.350–4.500)

## 2019-08-07 LAB — IRON AND TIBC
Iron: 36 ug/dL (ref 28–170)
Saturation Ratios: 16 % (ref 10.4–31.8)
TIBC: 227 ug/dL — ABNORMAL LOW (ref 250–450)
UIBC: 191 ug/dL

## 2019-08-07 LAB — SARS CORONAVIRUS 2 (TAT 6-24 HRS): SARS Coronavirus 2: NEGATIVE

## 2019-08-07 LAB — PHOSPHORUS: Phosphorus: 3.5 mg/dL (ref 2.5–4.6)

## 2019-08-07 LAB — LACTIC ACID, PLASMA: Lactic Acid, Venous: 2.1 mmol/L (ref 0.5–1.9)

## 2019-08-07 LAB — FOLATE: Folate: 13.1 ng/mL (ref >5.9)

## 2019-08-07 MED ORDER — GABAPENTIN 300 MG PO CAPS
300.0000 mg | ORAL_CAPSULE | Freq: Every evening | ORAL | Status: DC
  Administered 2019-08-07 – 2019-08-08 (×2): 300 mg via ORAL
  Filled 2019-08-07 (×2): qty 1

## 2019-08-07 MED ORDER — ATORVASTATIN CALCIUM 20 MG PO TABS
40.0000 mg | ORAL_TABLET | Freq: Every day | ORAL | Status: DC
  Administered 2019-08-07 – 2019-08-08 (×2): 40 mg via ORAL
  Filled 2019-08-07 (×2): qty 2

## 2019-08-07 MED ORDER — LACTATED RINGERS IV SOLN
INTRAVENOUS | Status: AC
  Administered 2019-08-07 (×2): via INTRAVENOUS

## 2019-08-07 MED ORDER — MIRABEGRON ER 25 MG PO TB24
25.0000 mg | ORAL_TABLET | Freq: Every day | ORAL | Status: DC
  Administered 2019-08-07 – 2019-08-09 (×3): 25 mg via ORAL
  Filled 2019-08-07 (×3): qty 1

## 2019-08-07 MED ORDER — NETARSUDIL-LATANOPROST 0.02-0.005 % OP SOLN: 1.0000 [drp] | Freq: Every day | OPHTHALMIC | Status: DC

## 2019-08-07 MED ORDER — METOPROLOL SUCCINATE ER 25 MG PO TB24
100.0000 mg | ORAL_TABLET | Freq: Every evening | ORAL | Status: DC
  Filled 2019-08-07: qty 4

## 2019-08-07 MED ORDER — TRAZODONE HCL 50 MG PO TABS
50.0000 mg | ORAL_TABLET | Freq: Every evening | ORAL | Status: DC | PRN
  Filled 2019-08-07: qty 1

## 2019-08-07 MED ORDER — FUROSEMIDE 20 MG PO TABS: 20.0000 mg | ORAL_TABLET | Freq: Every day | ORAL | Status: DC

## 2019-08-07 MED ORDER — SODIUM CHLORIDE 0.9 % IV BOLUS
500.0000 mL | Freq: Once | INTRAVENOUS | Status: AC
  Administered 2019-08-07: 500 mL via INTRAVENOUS

## 2019-08-07 MED ORDER — SODIUM CHLORIDE 0.9 % IV SOLN
INTRAVENOUS | Status: AC
  Administered 2019-08-07: 21:00:00 via INTRAVENOUS

## 2019-08-07 MED ORDER — IMIPRAMINE HCL 50 MG PO TABS
25.0000 mg | ORAL_TABLET | Freq: Every day | ORAL | Status: DC
  Administered 2019-08-07 – 2019-08-08 (×2): 25 mg via ORAL
  Filled 2019-08-07 (×5): qty 1

## 2019-08-07 MED ORDER — FLUTICASONE PROPIONATE 50 MCG/ACT NA SUSP
2.0000 | Freq: Every day | NASAL | Status: DC
  Filled 2019-08-07 (×2): qty 16

## 2019-08-07 MED ORDER — SODIUM CHLORIDE 0.9% FLUSH
3.0000 mL | Freq: Two times a day (BID) | INTRAVENOUS | Status: DC
  Administered 2019-08-07 – 2019-08-09 (×3): 3 mL via INTRAVENOUS

## 2019-08-07 MED ORDER — IOHEXOL 300 MG/ML  SOLN
100.0000 mL | Freq: Once | INTRAMUSCULAR | Status: AC | PRN
  Administered 2019-08-07: 100 mL via INTRAVENOUS

## 2019-08-07 MED ORDER — MORPHINE SULFATE (PF) 2 MG/ML IV SOLN
1.0000 mg | INTRAVENOUS | Status: DC | PRN
  Administered 2019-08-07 – 2019-08-08 (×2): 1 mg via INTRAVENOUS
  Filled 2019-08-07 (×2): qty 1

## 2019-08-07 NOTE — H&P (Signed)
History and Physical    Becky Trevino [id number] DOB: 1945/09/20 DOA: 08/07/2019  PCP: [name], MD  Patient coming from: Home  Chief Complaint: Syncope  HPI: Becky Trevino is a 73 y.o. female with medical history significant of DM II, DVT Rt leg diagnosed this year in august Today she was finishing her shift at [employer] and fell backwards while trying to put her items in her cart  and hit into a furniture behind her. Pt denies any loss of consciousness ,Pt also reports dizziness for couple of weeks .She denies any other complaints otherwise today.  She reports 2 episodes one last night and one this morning no fevers chills chest pain shortness of breath.  Troponin of 5, lactic acid of 2.1,  ED Course:  Blood pressure 120/81, pulse 66, temperature 97.7 F (36.5 C), temperature source Oral, resp. rate 16, height 5\' 11"  (1.803 m), weight 113.4 kg, SpO2 99 %. CBC shows a hemoglobin of 9.5, hematocrit of 29.9, RDW of 16, CMP is within normal limits except for creatinine of 1.43 with an estimated GFR of 42, Requested admission for anemia, syncopal episode for patient on Xarelto to monitor in observation status for any further hemoglobin drops.  Review of Systems: As per HPI otherwise 10 point review of systems negative.    Past Medical History:  Diagnosis Date   Cervical spondylosis    Chronic kidney disease    Stage 3   DDD (degenerative disc disease), cervical    [facility]   Diabetes mellitus without complication (HCC)    Diverticulosis    Hyperlipidemia    Hypertension    Intertrigo    Leg cramps    Leg varices    Obesity    OSA (obstructive sleep apnea)    Symptomatic menopausal or female climacteric states     Past Surgical History:  Procedure Laterality Date   ABDOMINAL HYSTERECTOMY  1993   Total   BUNIONECTOMY Bilateral 1993   I&D KNEE WITH POLY EXCHANGE Right 11/19/2017   Procedure: RIGHT KNEE POLY EXCHANGE WITH IRRIGATION AND  DEBRIDEMENT;  Surgeon: Hessie Knows, MD;  Location: [facility] OR[facility];  Service: Orthopedics;  Laterality: Right;   JOINT REPLACEMENT     bilateral knee   LAMINECTOMY  11/14/2013    Cervical Fusion , [facility], Dr. Delilah Shan   LASER ABLATION Bilateral 07/29/2012   Dr. Lucky Cowboy   REPLACEMENT TOTAL KNEE Left 03/2010   Dr. Rudene Christians   TOTAL HIP ARTHROPLASTY Left 12/11/2015   Procedure: TOTAL HIP ARTHROPLASTY ANTERIOR APPROACH;  Surgeon: Hessie Knows, MD;  Location: [facility];  Service: Orthopedics;  Laterality: Left;   TOTAL KNEE ARTHROPLASTY Right 03/11/2016   Procedure: TOTAL KNEE ARTHROPLASTY;  Surgeon: Hessie Knows, MD;  Location: [facility];  Service: Orthopedics;  Laterality: Right;     reports that she has never smoked. She has never used smokeless tobacco. She reports that she does not drink alcohol or use drugs.  Allergies  Allergen Reactions   Lisinopril Swelling    Family History  Problem Relation Age of Onset   Diabetes Mother    Hyperlipidemia Mother    Hypertension Mother    Diabetes Father    Hyperlipidemia Father    Hypertension Father    Obesity Father    Hypertension Sister    Hyperlipidemia Sister    Hyperlipidemia Sister    Breast cancer Neg Hx     Prior to Admission medications   Medication Sig Start Date End Date Taking? Authorizing Provider  acetaminophen (  TYLENOL) 650 MG CR tablet Take 650-1,300 mg by mouth 2 (two) times daily as needed for pain.   Yes [provider]  atorvastatin (LIPITOR) 40 MG tablet Take 1 tablet (40 mg total) by mouth daily at 6 PM. 05/12/19  Yes Sowles, Drue Stager, MD  Calcium Carbonate-Vitamin D (CALCIUM 600-D) 600-400 MG-UNIT per tablet Take 1 tablet by mouth 2 (two) times daily. 02/01/09  Yes [provider]  celecoxib (CELEBREX) 200 MG capsule Take 200 mg by mouth daily as needed for moderate pain.  07/20/17  Yes [provider]  fluticasone (FLONASE) 50 MCG/ACT nasal spray Place 2 sprays into both nostrils  daily. Patient taking differently: Place 2 sprays into both nostrils daily as needed for allergies.  05/21/17  Yes Sowles, Drue Stager, MD  furosemide (LASIX) 20 MG tablet Take 20 mg by mouth daily. 08/17/15  Yes Callwood, Dwayne D, MD  gabapentin (NEURONTIN) 300 MG capsule Take 1 capsule (300 mg total) by mouth every evening. 11/25/18  Yes Sowles, Drue Stager, MD  glucose blood (ONE TOUCH ULTRA TEST) test strip Use as instructed 08/27/15  Yes Sowles, Drue Stager, MD  imipramine (TOFRANIL) 25 MG tablet Take 1 tablet (25 mg total) by mouth at bedtime. 05/06/19  Yes [name], Larene Beach A, PA-C  ketoconazole (NIZORAL) 2 % cream Apply 1 application topically 2 (two) times daily as needed (rash).  01/26/15  Yes [provider]  KLOR-CON 10 10 MEQ tablet Take 10 mEq by mouth daily.  02/08/16  Yes [provider]  Magnesium Oxide 500 MG CAPS Take 500 mg by mouth daily.  11/10/07  Yes [provider]  metoprolol succinate (TOPROL-XL) 100 MG 24 hr tablet TAKE 1 TABLET (100 MG TOTAL) BY MOUTH EVERY EVENING. TAKE WITH OR IMMEDIATELY FOLLOWING A MEAL. 05/21/19  Yes Sowles, Drue Stager, MD  mirabegron ER (MYRBETRIQ) 25 MG TB24 tablet Take 1 tablet (25 mg total) by mouth daily. 06/07/19  Yes [name], Larene Beach A, PA-C  Netarsudil-Latanoprost (ROCKLATAN) 0.02-0.005 % SOLN Place 1 drop into both eyes at bedtime.   Yes [provider]  traZODone (DESYREL) 50 MG tablet TAKE 1 TABLET BY MOUTH AT BEDTIME AS NEEDED FOR SLEEP Patient taking differently: Take 50 mg by mouth at bedtime as needed for sleep.  08/29/18  Yes Sowles, Drue Stager, MD  XARELTO 20 MG TABS tablet TAKE 1 TABLET BY MOUTH ONCE DAILY WITH  SUPPER Patient taking differently: Take 20 mg by mouth daily with supper.  07/27/19  Yes [name], MD    Physical Exam: Vitals:   08/07/19 1302 08/07/19 1330 08/07/19 1400 08/07/19 1430  BP: 135/77 (!) 141/80 120/81 103/68  Pulse: 67 66  69  Resp: 16     Temp: 97.7 F (36.5 C)     TempSrc: Oral      SpO2: 95% 99%  98%  Weight:      Height:        Constitutional: NAD, calm, comfortable Vitals:   08/07/19 1302 08/07/19 1330 08/07/19 1400 08/07/19 1430  BP: 135/77 (!) 141/80 120/81 103/68  Pulse: 67 66  69  Resp: 16     Temp: 97.7 F (36.5 C)     TempSrc: Oral     SpO2: 95% 99%  98%  Weight:      Height:       Eyes: PERRL, lids and conjunctivae normal ENMT: Mucous membranes are moist. Posterior pharynx clear of any exudate or lesions.Normal dentition.  Neck: normal, supple, no masses, no thyromegaly Respiratory: clear to auscultation bilaterally, no  wheezing, no crackles. Normal respiratory effort. No accessory muscle use.  Cardiovascular: Regular rate and rhythm, no murmurs / rubs / gallops. No extremity edema. 2+ pedal pulses. No carotid bruits.  Abdomen: no tenderness, no masses palpated. No hepatosplenomegaly. Bowel sounds positive.  Musculoskeletal: no clubbing / cyanosis. No joint deformity upper and lower extremities. Good ROM, no contractures. Normal muscle tone.  Skin: no rashes, lesions, ulcers. No induration Neurologic: CN 2-12 grossly intact. Sensation intact, DTR normal. Strength 5/5 in all 4.  Psychiatric: Normal judgment and insight. Alert and oriented x 3. Normal mood.   Labs on Admission: I have personally reviewed following labs and imaging studies  CBC: Recent Labs  Lab 08/07/19 0952  WBC 6.7  NEUTROABS 4.8  HGB 9.5*  HCT 29.9*  MCV 97.1  PLT 0000000   Basic Metabolic Panel: Recent Labs  Lab 08/07/19 0952  NA 140  K 4.4  CL 106  CO2 25  GLUCOSE 113*  BUN 27*  CREATININE 1.43*  CALCIUM 8.6*   GFR: Estimated Creatinine Clearance: 48.6 mL/min (A) (by C-G formula based on SCr of 1.43 mg/dL (H)). Liver Function Tests: Recent Labs  Lab 08/07/19 0952  AST 22  ALT 16  ALKPHOS 97  BILITOT 0.7  PROT 6.8  ALBUMIN 3.4*   No results for input(s): LIPASE, AMYLASE in the last 168 hours. No results for input(s): AMMONIA in the last 168  hours. Coagulation Profile: No results for input(s): INR, PROTIME in the last 168 hours. Cardiac Enzymes: No results for input(s): CKTOTAL, CKMB, CKMBINDEX, TROPONINI in the last 168 hours. BNP (last 3 results) No results for input(s): PROBNP in the last 8760 hours. HbA1C: No results for input(s): HGBA1C in the last 72 hours. CBG: No results for input(s): GLUCAP in the last 168 hours. Lipid Profile: No results for input(s): CHOL, HDL, LDLCALC, TRIG, CHOLHDL, LDLDIRECT in the last 72 hours. Thyroid Function Tests: Recent Labs    08/07/19 1202  TSH 2.837   Anemia Panel: No results for input(s): VITAMINB12, FOLATE, FERRITIN, TIBC, IRON, RETICCTPCT in the last 72 hours. Urine analysis:    Component Value Date/Time   COLORURINE YELLOW (A) 11/17/2017 1510   APPEARANCEUR CLOUDY (A) 11/17/2017 1510   LABSPEC 1.019 11/17/2017 1510   PHURINE 6.0 11/17/2017 1510   GLUCOSEU NEGATIVE 11/17/2017 1510   HGBUR SMALL (A) 11/17/2017 1510   BILIRUBINUR NEGATIVE 11/17/2017 1510   KETONESUR NEGATIVE 11/17/2017 1510   PROTEINUR NEGATIVE 11/17/2017 1510   NITRITE NEGATIVE 11/17/2017 1510   LEUKOCYTESUR NEGATIVE 11/17/2017 1510    Radiological Exams on Admission: Ct Head Wo Contrast  Result Date: 08/07/2019 CLINICAL DATA:  Minor head trauma with high clinical risk EXAM: CT HEAD WITHOUT CONTRAST TECHNIQUE: Contiguous axial images were obtained from the base of the skull through the vertex without intravenous contrast. COMPARISON:  05/23/2012 FINDINGS: Brain: No evidence of acute infarction, hemorrhage, hydrocephalus, extra-axial collection or mass lesion/mass effect. Vascular: No hyperdense vessel or unexpected calcification. Skull: Occipital cervical fusion which is partially covered. No evidence of hardware failure. Negative for calvarial fracture. Sinuses/Orbits: Negative IMPRESSION: No acute finding or evidence of injury. Electronically Signed   By: Monte Fantasia M.D.   On: 08/07/2019 11:34    Ct Abdomen Pelvis W Contrast  Result Date: 08/07/2019 CLINICAL DATA:  Syncope. Blunt abdominal trauma. Decreased hemoglobin. Left buttock hematoma. EXAM: CT ABDOMEN AND PELVIS WITH CONTRAST TECHNIQUE: Multidetector CT imaging of the abdomen and pelvis was performed using the standard protocol following bolus administration of intravenous contrast. CONTRAST:  172mL OMNIPAQUE IOHEXOL 300 MG/ML  SOLN COMPARISON:  None. FINDINGS: Lower chest: Mild hypoventilatory changes at the dependent lung bases. Coronary atherosclerosis. Hepatobiliary: Normal liver with no liver laceration or mass. Probable tiny layering gallstone in the otherwise normal gallbladder. No biliary ductal dilatation. Moderate periampullary duodenal diverticulum. Pancreas: Normal, with no laceration, mass or duct dilation. Spleen: Normal size. No laceration or mass. Adrenals/Urinary Tract: Normal adrenals. No hydronephrosis. No renal laceration. Subcentimeter hypodense renal cortical lesions in the kidneys bilaterally, too small to characterize, requiring no follow-up. Bladder obscured by streak artifact from left hip hardware. Stomach/Bowel: Grossly normal stomach. Normal caliber small bowel with no small bowel wall thickening. Normal appendix. Normal large bowel with no diverticulosis, large bowel wall thickening or pericolonic fat stranding. Vascular/Lymphatic: Atherosclerotic nonaneurysmal abdominal aorta. Patent portal, splenic, hepatic and renal veins. No pathologically enlarged lymph nodes in the abdomen or pelvis. Reproductive: Status post hysterectomy, with no abnormal findings at the vaginal cuff. No adnexal mass. Other: No pneumoperitoneum. No hemoperitoneum. There is a large ill-defined subcutaneous hematoma throughout the left gluteal region spanning up to 13.4 x 6.1 cm. No active contrast extravasation or pseudoaneurysm in the visualized portion of the left gluteal region, noting that the superficial most portion of the left gluteal  region was excluded from the images due to patient's body habitus. Musculoskeletal: No aggressive appearing focal osseous lesions. No fracture in the abdomen or pelvis. Marked thoracolumbar spondylosis. Left total hip arthroplasty with no hip dislocation and no evidence of hardware fracture or loosening. Severe right hip osteoarthritis. IMPRESSION: 1. Large ill-defined subcutaneous hematoma throughout the left gluteal region. No evidence of active contrast extravasation. 2. No additional acute traumatic injuries in the abdomen or pelvis. 3. Chronic findings include: Aortic Atherosclerosis (ICD10-I70.0). Coronary atherosclerosis. Probable cholelithiasis. Severe right hip osteoarthritis. Electronically Signed   By: Ilona Sorrel M.D.   On: 08/07/2019 12:39   Dg Chest Portable 1 View  Result Date: 08/07/2019 CLINICAL DATA:  Syncope EXAM: PORTABLE CHEST 1 VIEW COMPARISON:  03/28/2010 chest radiograph. FINDINGS: Stable cardiomediastinal silhouette with normal heart size. No pneumothorax. No pleural effusion. Lungs appear clear, with no acute consolidative airspace disease and no pulmonary edema. Severe bilateral shoulder arthritis with deformity of the humeral heads, suggestive of neuropathic arthropathy. IMPRESSION: 1. No active cardiopulmonary disease. 2. Severe bilateral shoulder arthritis with deformity of the humeral heads, suggestive of neuropathic arthropathy. Electronically Signed   By: Ilona Sorrel M.D.   On: 08/07/2019 10:57   Dg Hip Unilat W Or Wo Pelvis 2-3 Views Left  Result Date: 08/07/2019 CLINICAL DATA:  Left hip pain after fall EXAM: DG HIP (WITH OR WITHOUT PELVIS) 2-3V LEFT COMPARISON:  12/11/2015 left hip radiographs FINDINGS: Status post left total hip arthroplasty, with no evidence of hardware fracture or loosening. No left hip dislocation. No osseous left hip fracture. No pelvic fracture or diastasis. No suspicious focal osseous lesions. Severe right hip osteoarthritis. Degenerative changes  in the visualized lower lumbar spine. IMPRESSION: 1. Status post left total hip arthroplasty, with no hardware complication. No osseous fracture. No left hip dislocation. 2. Severe right hip osteoarthritis. Electronically Signed   By: Ilona Sorrel M.D.   On: 08/07/2019 10:58    EKG: Independently reviewed.  EKG shows sinus rhythm at 80 with normal intervals.  Assessment/Plan Principal Problem:   Syncope Active Problems:   Anemia   Benign essential HTN   Type 2 diabetes mellitus with diabetic neuropathy, without long-term current use of insulin ([place])  Syncope/fall: Patient seen today for  her syncopal episode, attribute to stasis most likely.  Initial head CT negative for any acute findings, initial vitals show blood pressure of 78/52.  Current blood pressure of 103/68. We will admit patient to MedSurg floor with telemetry monitoring, orthostatic blood pressure and tailor all medications.   Anemia: Patient has a bruise in her left buttock from the fall and the blood thinner. H/h q 8h.  Hypertension:  As BP is low we will hold all her home meds.  Home regimen with metoprolol held.  Diabetes mellitus type 2: Ssi/accuchecks .  DVT prophylaxis: Xarelto at home. Code Status: Full Family Communication: Spouse at bedside Disposition Plan: D/C in 48 hours to home Consults called: Consider GI consult  Admission status: Inpatient.   Para Skeans MD [facility] If 7PM-7AM, please contact night-coverage [email or web address] Password [facility]  08/07/2019, 3:04 PM

## 2019-08-07 NOTE — ED Notes (Signed)
Dr. Quentin Cornwall is aware of Lactic of 2.1

## 2019-08-07 NOTE — ED Notes (Signed)
Patient repositioned on bed, pants and shoes removed. Husband at bedside. NAD.

## 2019-08-07 NOTE — ED Notes (Signed)
Patient transported to X-ray 

## 2019-08-07 NOTE — ED Notes (Signed)
Pharmacy tech confirmed with patient that she is taking Rock-Latan eye drops and will have family bring it in.

## 2019-08-07 NOTE — ED Provider Notes (Signed)
[facility] Emergency Department Provider Note    First MD Initiated Contact with Patient 08/07/19 0957     (approximate)  I have reviewed the triage vital signs and the nursing notes.   HISTORY  Chief Complaint Loss of Consciousness    HPI Becky Trevino is a 73 y.o. female plosive past medical history on Xarelto for history of blood clot presents to the ER with low blood pressure as well as syncopal episode twice in the past 24 hours.  Had an episode last night when she was checking into work did fall hit her left hip and head.  Had another episode this morning where she was feeling weak and lightheaded.  Denies any chest pain.  No diarrhea.  No shortness of breath.  No medication changes.  States her appetite's been good.    Past Medical History:  Diagnosis Date  . Cervical spondylosis   . Chronic kidney disease    Stage 3  . DDD (degenerative disc disease), cervical    [facility]  . Diabetes mellitus without complication ([place])   . Diverticulosis   . Hyperlipidemia   . Hypertension   . Intertrigo   . Leg cramps   . Leg varices   . Obesity   . OSA (obstructive sleep apnea)   . Symptomatic menopausal or female climacteric states    Family History  Problem Relation Age of Onset  . Diabetes Mother   . Hyperlipidemia Mother   . Hypertension Mother   . Diabetes Father   . Hyperlipidemia Father   . Hypertension Father   . Obesity Father   . Hypertension Sister   . Hyperlipidemia Sister   . Hyperlipidemia Sister   . Breast cancer Neg Hx    Past Surgical History:  Procedure Laterality Date  . ABDOMINAL HYSTERECTOMY  1993   Total  . BUNIONECTOMY Bilateral 1993  . I&D KNEE WITH POLY EXCHANGE Right 11/19/2017   Procedure: RIGHT KNEE POLY EXCHANGE WITH IRRIGATION AND DEBRIDEMENT;  Surgeon: Hessie Knows, MD;  Location: [facility] ORS;  Service: Orthopedics;  Laterality: Right;  . JOINT REPLACEMENT     bilateral knee  . LAMINECTOMY  11/14/2013     Cervical Fusion , [facility], Dr. Delilah Shan  . LASER ABLATION Bilateral 07/29/2012   Dr. Lucky Cowboy  . REPLACEMENT TOTAL KNEE Left 03/2010   Dr. Rudene Christians  . TOTAL HIP ARTHROPLASTY Left 12/11/2015   Procedure: TOTAL HIP ARTHROPLASTY ANTERIOR APPROACH;  Surgeon: Hessie Knows, MD;  Location: [facility] ORS;  Service: Orthopedics;  Laterality: Left;  . TOTAL KNEE ARTHROPLASTY Right 03/11/2016   Procedure: TOTAL KNEE ARTHROPLASTY;  Surgeon: Hessie Knows, MD;  Location: [facility] ORS;  Service: Orthopedics;  Laterality: Right;   Patient Active Problem List   Diagnosis Date Noted  . Syncope 08/07/2019  . Chronic infection of right knee ([place]) 11/25/2018  . Infection and inflammatory reaction due to internal joint prosthesis, subsequent encounter 01/25/2018  . Infection of total right knee replacement ([place]) 11/19/2017  . Type 2 diabetes mellitus with diabetic neuropathy, without long-term current use of insulin ([place]) 11/06/2016  . Primary osteoarthritis of knee 03/11/2016  . Primary osteoarthritis of left hip 12/11/2015  . Benign essential HTN 04/21/2015  . Edema leg 04/21/2015  . Chronic kidney disease (CKD), stage III (moderate) 04/21/2015  . Chronic venous insufficiency 04/21/2015  . DDD (degenerative disc disease), cervical 04/21/2015  . Diabetes mellitus with renal manifestation ([place]) 04/21/2015  . Dyslipidemia 04/21/2015  . Bladder cystocele 04/21/2015  . Urinary incontinence  04/21/2015  . Leg varices 04/21/2015  . Insomnia 04/21/2015  . Eczema intertrigo 04/21/2015  . Obesity, morbid ([place]) 04/21/2015  . Obstructive apnea 04/21/2015  . Menopausal and perimenopausal disorder 04/21/2015  . Supraventricular premature beats 04/21/2015  . Osteoarthritis of multiple joints 04/21/2015  . H/O Spinal surgery 11/14/2013  . Detrusor muscle hypertonia 11/07/2013      Prior to Admission medications   Medication Sig Start Date End Date Taking? Authorizing Provider  acetaminophen (TYLENOL) 650 MG CR tablet Take  650-1,300 mg by mouth 2 (two) times daily as needed for pain.   Yes [provider]  atorvastatin (LIPITOR) 40 MG tablet Take 1 tablet (40 mg total) by mouth daily at 6 PM. 05/12/19  Yes Sowles, Drue Stager, MD  Calcium Carbonate-Vitamin D (CALCIUM 600-D) 600-400 MG-UNIT per tablet Take 1 tablet by mouth 2 (two) times daily. 02/01/09  Yes [provider]  celecoxib (CELEBREX) 200 MG capsule Take 200 mg by mouth daily as needed for moderate pain.  07/20/17  Yes [provider]  fluticasone (FLONASE) 50 MCG/ACT nasal spray Place 2 sprays into both nostrils daily. Patient taking differently: Place 2 sprays into both nostrils daily as needed for allergies.  05/21/17  Yes Sowles, Drue Stager, MD  furosemide (LASIX) 20 MG tablet Take 20 mg by mouth daily. 08/17/15  Yes Callwood, Dwayne D, MD  gabapentin (NEURONTIN) 300 MG capsule Take 1 capsule (300 mg total) by mouth every evening. 11/25/18  Yes Sowles, Drue Stager, MD  glucose blood (ONE TOUCH ULTRA TEST) test strip Use as instructed 08/27/15  Yes Sowles, Drue Stager, MD  imipramine (TOFRANIL) 25 MG tablet Take 1 tablet (25 mg total) by mouth at bedtime. 05/06/19  Yes [name], Larene Beach A, PA-C  ketoconazole (NIZORAL) 2 % cream Apply 1 application topically 2 (two) times daily as needed (rash).  01/26/15  Yes [provider]  KLOR-CON 10 10 MEQ tablet Take 10 mEq by mouth daily.  02/08/16  Yes [provider]  Magnesium Oxide 500 MG CAPS Take 500 mg by mouth daily.  11/10/07  Yes [provider]  metoprolol succinate (TOPROL-XL) 100 MG 24 hr tablet TAKE 1 TABLET (100 MG TOTAL) BY MOUTH EVERY EVENING. TAKE WITH OR IMMEDIATELY FOLLOWING A MEAL. 05/21/19  Yes Sowles, Drue Stager, MD  mirabegron ER (MYRBETRIQ) 25 MG TB24 tablet Take 1 tablet (25 mg total) by mouth daily. 06/07/19  Yes [name], Larene Beach A, PA-C  Netarsudil-Latanoprost (ROCKLATAN) 0.02-0.005 % SOLN Place 1 drop into both eyes at bedtime.   Yes [provider]   traZODone (DESYREL) 50 MG tablet TAKE 1 TABLET BY MOUTH AT BEDTIME AS NEEDED FOR SLEEP Patient taking differently: Take 50 mg by mouth at bedtime as needed for sleep.  08/29/18  Yes Sowles, Drue Stager, MD  XARELTO 20 MG TABS tablet TAKE 1 TABLET BY MOUTH ONCE DAILY WITH  SUPPER Patient taking differently: Take 20 mg by mouth daily with supper.  07/27/19  Yes [name], MD    Allergies Lisinopril    Social History Social History   Tobacco Use  . Smoking status: Never Smoker  . Smokeless tobacco: Never Used  Substance Use Topics  . Alcohol use: No    Alcohol/week: 0.0 standard drinks  . Drug use: No    Review of Systems Patient denies headaches, rhinorrhea, blurry vision, numbness, shortness of breath, chest pain, edema, cough, abdominal pain, nausea, vomiting, diarrhea, dysuria, fevers, rashes or hallucinations unless otherwise stated above in HPI. ____________________________________________   PHYSICAL EXAM:  VITAL SIGNS: Vitals:  08/07/19 1105 08/07/19 1302  BP: 109/88 135/77  Pulse:  67  Resp:  16  Temp:  97.7 F (36.5 C)  SpO2:  95%    Constitutional: Alert and oriented.  Eyes: Conjunctivae are normal.  Head: Atraumatic. Nose: No congestion/rhinnorhea. Mouth/Throat: Mucous membranes are moist.   Neck: No stridor. Painless ROM.  Cardiovascular: Normal rate, regular rhythm. Grossly normal heart sounds.  Good peripheral circulation. Respiratory: Normal respiratory effort.  No retractions. Lungs CTAB. Gastrointestinal: Soft and nontender. No distention. No abdominal bruits. No CVA tenderness. Genitourinary:  Musculoskeletal: left left gluteal hematoma and ecchymosis. No lower extremity tenderness nor edema.  No joint effusions. Neurologic:  Normal speech and language. No gross focal neurologic deficits are appreciated. No facial droop Skin:  Skin is warm, dry and intact. No rash noted. Psychiatric: Mood and affect are normal. Speech and behavior are normal.   ____________________________________________   LABS (all labs ordered are listed, but only abnormal results are displayed)  Results for orders placed or performed during the hospital encounter of 08/07/19 (from the past 24 hour(s))  CBC with Differential     Status: Abnormal   Collection Time: 08/07/19  9:52 AM  Result Value Ref Range   WBC 6.7 4.0 - 10.5 K/uL   RBC 3.08 (L) 3.87 - 5.11 MIL/uL   Hemoglobin 9.5 (L) 12.0 - 15.0 g/dL   HCT 29.9 (L) 36.0 - 46.0 %   MCV 97.1 80.0 - 100.0 fL   MCH 30.8 26.0 - 34.0 pg   MCHC 31.8 30.0 - 36.0 g/dL   RDW 16.0 (H) 11.5 - 15.5 %   Platelets 188 150 - 400 K/uL   nRBC 0.0 0.0 - 0.2 %   Neutrophils Relative % 73 %   Neutro Abs 4.8 1.7 - 7.7 K/uL   Lymphocytes Relative 18 %   Lymphs Abs 1.2 0.7 - 4.0 K/uL   Monocytes Relative 6 %   Monocytes Absolute 0.4 0.1 - 1.0 K/uL   Eosinophils Relative 3 %   Eosinophils Absolute 0.2 0.0 - 0.5 K/uL   Basophils Relative 0 %   Basophils Absolute 0.0 0.0 - 0.1 K/uL   Immature Granulocytes 0 %   Abs Immature Granulocytes 0.02 0.00 - 0.07 K/uL  Comprehensive metabolic panel     Status: Abnormal   Collection Time: 08/07/19  9:52 AM  Result Value Ref Range   Sodium 140 135 - 145 mmol/L   Potassium 4.4 3.5 - 5.1 mmol/L   Chloride 106 98 - 111 mmol/L   CO2 25 22 - 32 mmol/L   Glucose, Bld 113 (H) 70 - 99 mg/dL   BUN 27 (H) 8 - 23 mg/dL   Creatinine, Ser 1.43 (H) 0.44 - 1.00 mg/dL   Calcium 8.6 (L) 8.9 - 10.3 mg/dL   Total Protein 6.8 6.5 - 8.1 g/dL   Albumin 3.4 (L) 3.5 - 5.0 g/dL   AST 22 15 - 41 U/L   ALT 16 0 - 44 U/L   Alkaline Phosphatase 97 38 - 126 U/L   Total Bilirubin 0.7 0.3 - 1.2 mg/dL   GFR calc non Af Amer 36 (L) >60 mL/min   GFR calc Af Amer 42 (L) >60 mL/min   Anion gap 9 5 - 15  Troponin I (High Sensitivity)     Status: None   Collection Time: 08/07/19  9:52 AM  Result Value Ref Range   Troponin I (High Sensitivity) 5 <18 ng/L  Lactic acid, plasma     Status: Abnormal  Collection Time: 08/07/19 11:10 AM  Result Value Ref Range   Lactic Acid, Venous 2.1 (HH) 0.5 - 1.9 mmol/L  Type and screen [place]     Status: None (Preliminary result)   Collection Time: 08/07/19 11:56 AM  Result Value Ref Range   ABO/RH(D) PENDING    Antibody Screen NEG    Sample Expiration      08/10/2019,2359 Performed at [facility], [place], [place] [address]   Troponin I (High Sensitivity)     Status: None   Collection Time: 08/07/19 11:56 AM  Result Value Ref Range   Troponin I (High Sensitivity) 4 <18 ng/L   ____________________________________________  EKG My review and personal interpretation at Time: 9:47   Indication: syncope  Rate: 80  Rhythm: sinus Axis: normal Other: normal intervals, no stemi ____________________________________________  RADIOLOGY  I personally reviewed all radiographic images ordered to evaluate for the above acute complaints and reviewed radiology reports and findings.  These findings were personally discussed with the patient.  Please see medical record for radiology report.  ____________________________________________   PROCEDURES  Procedure(s) performed:  Procedures    Critical Care performed: no ____________________________________________   INITIAL IMPRESSION / ASSESSMENT AND PLAN / ED COURSE  Pertinent labs & imaging results that were available during my care of the patient were reviewed by me and considered in my medical decision making (see chart for details).   DDX: Dehydration, electrolyte abnormality, orthostasis, anemia, GI bleed, dysrhythmia, ACS fracture  Becky Trevino is a 73 y.o. who presents to the ED with symptoms as described above.  And swelling concerning given multiple syncopal episodes.  She is currently not having chest pain is complaining of left hip pain.  CT imaging radiographs we ordered for the but differential.  Will check blood work.  Clinical  Course as of Aug 06 1320  Sun Aug 07, 2019  1118 Negative guaiac.  Does have large left buttock hematoma which may explain her drop in her hemoglobin. Will order ct to rule out active extrav as she is on xarelto.   [PR]  [id number] No evidence of active extravasation.  Given her 2 syncopal episodes however I do feel that she warrants observation in the hospital for serial hemoglobins.  Have discussed with the patient and available family all diagnostics and treatments performed thus far and all questions were answered to the best of my ability. The patient demonstrates understanding and agreement with plan.    [PR]    Clinical Course User Index [PR] Merlyn Lot, MD    The patient was evaluated in Emergency Department today for the symptoms described in the history of present illness. He/she was evaluated in the context of the global COVID-19 pandemic, which necessitated consideration that the patient might be at risk for infection with the SARS-CoV-2 virus that causes COVID-19. Institutional protocols and algorithms that pertain to the evaluation of patients at risk for COVID-19 are in a state of rapid change based on information released by regulatory bodies including the CDC and federal and state organizations. These policies and algorithms were followed during the patient's care in the ED.  As part of my medical decision making, I reviewed the following data within the [place] notes reviewed and incorporated, Labs reviewed, notes from prior ED visits and [place] Controlled Substance Database   ____________________________________________   FINAL CLINICAL IMPRESSION(S) / ED DIAGNOSES  Final diagnoses:  Syncope and collapse  Hematoma  Anemia, unspecified type  NEW MEDICATIONS STARTED DURING THIS VISIT:  New Prescriptions   No medications on file     Note:  This document was prepared using Dragon voice recognition software and may include unintentional  dictation errors.    Merlyn Lot, MD 08/07/19 1321

## 2019-08-07 NOTE — ED Notes (Signed)
Report received from [place], [place]. Pt is resting comfortably in ED stretcher. ED stretcher locked and in lowest position. Pt A&Ox4 and NAD at this time. V/S WNL. Pt states she is having some R hip pain. Pt has no other complaints at this time. Husband is at bedside. Will continue to monitor and call bell within reach.

## 2019-08-07 NOTE — ED Notes (Signed)
Patient given meal. Husband at bedside.

## 2019-08-07 NOTE — Progress Notes (Addendum)
ADDENDUM: Follow up imaging and labs per admitting request. See detailed HPI  1. Syncope and Collapse - Suspect orthostasis as likely cause given positive orthostatic vital signs - Admit to telemetry unit - CXR shows no active cardiopulmonary process - Have low suspicion for PE given presenting symptoms without evidence of hypoxia - Cardiac Enzymes negative - EKG shows no abnormality - Check Orthostatic Vital Signs - Head CT and follow up MRI Brain show no evidence of acute intracranial abnormality - Will hold off EEG as have low suspicion for seizure acitivity - Will check Cardiac Echo - Carotid Dopplers pending  - Labs reviewed for  Metabolic cause such as Hypoglycemia, Hypoxia, Shock, Hyperventilation, Anemia,  - Check U Tox + EtOH level,  2. AKI superimposed on CKD- BUN/Cr elevated above baseline *- Hold Lasix - Avoid Nephrotoxins - Gentle IVFs hydration - Continue to monitor renal function  3. HLD  + Goal LDL<100 - Atorvastatin 40mg  PO qhs  4. HTN  + Goal BP <130/80 - Holding home metoprolol due to hypotension  5. Diabetes Mellitus Type 2 without complications - not on medication - Recent HgbA1c 6.0.  - CBG monitoring - SSI  6. OSA - Uses CPAP at home - Will place on CPAP at bedtime  7. Microcytic anemia - see previous work up per admitting   Rufina Falco, DNP, [place], FNP-C [facility] Nurse Practitioner Between 7pm to [place] - Pager [phone] Actively using [employer] secure chat messaging  After 7am go to [email or web address] - [email or web address]  [facility] [employer]  [phone]

## 2019-08-07 NOTE — Progress Notes (Signed)
Patient brought to room 132, patient assigned to telemetry unit. Patient brought to room 257.

## 2019-08-07 NOTE — ED Notes (Signed)
Hospitalist at bedside 

## 2019-08-07 NOTE — ED Triage Notes (Signed)
Pt to ED via POV, pt states that she was at work last night and had a syncopal episode causing her to fall and hit her head and left hip. Pt hypotensive in triage. Pt denies recent illness, states that she has been eating and drinking ok. Pt states that she is dizzy when standing. Pt is on Metoprolol 100 mg every night. Pt is in NAD at this time.

## 2019-08-07 NOTE — ED Notes (Signed)
Pt given meal tray at this time 

## 2019-08-07 NOTE — ED Notes (Signed)
Report given to Ashley RN

## 2019-08-07 NOTE — ED Notes (Signed)
Seizure pads in place

## 2019-08-08 ENCOUNTER — Inpatient Hospital Stay: Payer: BC Managed Care – PPO

## 2019-08-08 ENCOUNTER — Inpatient Hospital Stay
Admit: 2019-08-08 | Discharge: 2019-08-08 | Disposition: A | Discharge status: Home / Self Care | Payer: BC Managed Care – PPO | Attending: Nurse Practitioner | Admitting: Nurse Practitioner

## 2019-08-08 DIAGNOSIS — R55 Syncope and collapse: Secondary | ICD-10-CM

## 2019-08-08 DIAGNOSIS — I10 Essential (primary) hypertension: Secondary | ICD-10-CM

## 2019-08-08 DIAGNOSIS — D649 Anemia, unspecified: Secondary | ICD-10-CM

## 2019-08-08 DIAGNOSIS — E1140 Type 2 diabetes mellitus with diabetic neuropathy, unspecified: Secondary | ICD-10-CM

## 2019-08-08 LAB — HEMOGLOBIN
Hemoglobin: 8.3 g/dL — ABNORMAL LOW (ref 12.0–15.0)
Hemoglobin: 8.6 g/dL — ABNORMAL LOW (ref 12.0–15.0)

## 2019-08-08 LAB — ECHOCARDIOGRAM COMPLETE
Height: 71 in
Weight: 4177.6 oz

## 2019-08-08 LAB — VITAMIN B12: Vitamin B-12: 444 pg/mL (ref 180–914)

## 2019-08-08 LAB — ETHANOL: Alcohol, Ethyl (B): <10 mg/dL (ref <10)

## 2019-08-08 LAB — GLUCOSE, CAPILLARY: Glucose-Capillary: 66 mg/dL — ABNORMAL LOW (ref 70–99)

## 2019-08-08 NOTE — Plan of Care (Signed)

## 2019-08-08 NOTE — Progress Notes (Signed)
   08/08/19 1050  Therapy Vitals  Patient Position (if appropriate) Orthostatic Vitals  Orthostatic Lying   BP- Lying (!) 89/45  Pulse- Lying 75  Orthostatic Sitting  BP- Sitting 96/50  Pulse- Sitting 74  Orthostatic Standing at 0 minutes  BP- Standing at 0 minutes (!) 85/55  Pulse- Standing at 0 minutes 95  Orthostatic Standing at 3 minutes  BP- Standing at 3 minutes (!) 81/59 (p AMB to door adn back twice)  Pulse- Standing at 3 minutes 97    Orthostatic BP Established in PT evaluation. Pt asymptomatic.   10:52 AM, 08/08/19 Etta Grandchild, PT, DPT Physical Therapist - [facility]  [phone] [facility])

## 2019-08-08 NOTE — Progress Notes (Signed)
PROGRESS NOTE    Becky Trevino  [id number] DOB: May 19, 1946 DOA: 08/07/2019  PCP: [name], MD    LOS - 1   Brief Narrative:  73 y.o. female with medical history of DM II, DVT Rt leg diagnosed this year in August (on Xarelto), presented to the ED 11/22 after a fall and apparent near syncopal episode.  She had been dizzy for about 2 weeks, had episode of dizziness prior to falling.  No loss of consciousness.  She fell backwards onto a piece of furniture which struck her left buttock.  In the ED, vitals stable.  Labs notable for anemia (Hbg 9.5), creatinine 1.43.  Xarelto held.  Orthostatic vitals positive.  Admitted for observation and evaluation of anemia and syncope/near-syncope.    CT abdomen/pelvis to evaluate after trauma, given anemia, showed a left gluteal hematoma without signs of extravasation.   Subjective 11/23: Patient seen awake in bed.  No acute events reported overnight.  States she is feeling better today.  Left buttock is tender from the fall.    Assessment & Plan:   Principal Problem:   Syncope Active Problems:   Benign essential HTN   Type 2 diabetes mellitus with diabetic neuropathy, without long-term current use of insulin (HCC)   Anemia   Syncope and Collapse - most likely orthostatic hypotension, given positive orthostatic vital signs.  Troponin negative and no ischemic changes on ECG.  No hypoxia to suggest PE.  Head CT and MRI brain without acute abnormalities.  Unlikely seizure by history, no need for EEG at this time. - monitor on telemetry - follow up Echo - follow up carotid dopplers - continue IV hydration  AKI superimposed on CKD- BUN/Cr elevated above baseline - Hold Lasix - Avoid Nephrotoxins - Gentle IVFs hydration - Continue to monitor renal function  History of DVT - right LE.  On Xarelto. - hold Xarelto given anemia and trauma with fall causing gluteal hematoma  Hyperlipidemia  + Goal LDL<100 - Atorvastatin 40mg  PO qhs   Hypertension -  Chronic, stable + Goal BP <130/80 - Holding home metoprolol due to hypotension  Diabetes Mellitus Type 2 without complications - not on medications.  Recent HgbA1c 6.0.  - CBG monitoring - SSI  Obstructive Sleep Apnea - Uses CPAP at home - CPAP ordered  Microcytic anemia - see previous work up per admitting.  Suspect due to hematoma seen in left gluteal region ib CT abdomen/pelvis.  No sign of extravasation seen on CT.  No evidence on ongoing bleeding.  Site appears stable on exam. - monitor CBC   DVT prophylaxis: SCD's   Code Status: Full Code  Family Communication: none at bedside  Disposition Plan:  D/c home likely 1-2 days pending clinical course  Consultants:   none  Procedures:   Echo pending  Carotid dopplers pending  Antimicrobials:   None    Objective: Vitals:   08/08/19 0414 08/08/19 0420 08/08/19 0424 08/08/19 0752  BP: (!) 103/59 (!) 72/29 (!) 99/59 (!) 111/52  Pulse: 73  76 69  Resp:    19  Temp: 97.8 F (36.6 C)   98.1 F (36.7 C)  TempSrc: Oral     SpO2: 98%  100% 97%  Weight: 118.4 kg     Height:        Intake/Output Summary (Last 24 hours) at 08/08/2019 0807 Last data filed at 08/08/2019 0430 Gross per 24 hour  Intake 1581.54 ml  Output 700 ml  Net 881.54 ml   Danley Danker  Weights   08/07/19 0950 08/07/19 2141 08/08/19 0414  Weight: 113.4 kg 117.4 kg 118.4 kg    Examination:  General exam: awake, alert, no acute distress, obese HEENT: atraumatic, clear conjunctiva, anicteric sclera, moist mucus membranes, hearing grossly normal  Respiratory system: clear to auscultation bilaterally, no wheezes, rales or rhonchi, normal respiratory effort. Cardiovascular system: normal S1/S2, RRR, no JVD, murmurs, rubs, gallops, Right > Left lower extremity pitting edema Gastrointestinal system: soft, non-tender, non-distended abdomen, normal bowel sounds. Central nervous system: alert and oriented x4. no gross focal neurologic  deficits, normal speech Extremities: moves all, normal tone Psychiatry: normal mood, congruent affect, judgement and insight appear normal    Data Reviewed: I have personally reviewed following labs and imaging studies  CBC: Recent Labs  Lab 08/07/19 0952 08/07/19 1709 08/08/19 0021  WBC 6.7  --   --   NEUTROABS 4.8  --   --   HGB 9.5* 8.8* 8.3*  HCT 29.9*  --   --   MCV 97.1  --   --   PLT 188  --   --    Basic Metabolic Panel: Recent Labs  Lab 08/07/19 0952 08/07/19 1709  NA 140  --   K 4.4  --   CL 106  --   CO2 25  --   GLUCOSE 113*  --   BUN 27*  --   CREATININE 1.43*  --   CALCIUM 8.6*  --   MG  --  2.4  PHOS  --  3.5   GFR: Estimated Creatinine Clearance: 49.7 mL/min (A) (by C-G formula based on SCr of 1.43 mg/dL (H)). Liver Function Tests: Recent Labs  Lab 08/07/19 0952  AST 22  ALT 16  ALKPHOS 97  BILITOT 0.7  PROT 6.8  ALBUMIN 3.4*   No results for input(s): LIPASE, AMYLASE in the last 168 hours. No results for input(s): AMMONIA in the last 168 hours. Coagulation Profile: No results for input(s): INR, PROTIME in the last 168 hours. Cardiac Enzymes: No results for input(s): CKTOTAL, CKMB, CKMBINDEX, TROPONINI in the last 168 hours. BNP (last 3 results) No results for input(s): PROBNP in the last 8760 hours. HbA1C: No results for input(s): HGBA1C in the last 72 hours. CBG: Recent Labs  Lab 08/08/19 0806  GLUCAP 66*   Lipid Profile: No results for input(s): CHOL, HDL, LDLCALC, TRIG, CHOLHDL, LDLDIRECT in the last 72 hours. Thyroid Function Tests: Recent Labs    08/07/19 1202 08/07/19 1709  TSH 2.837  --   FREET4  --  0.75   Anemia Panel: Recent Labs    08/07/19 1709  VITAMINB12 444  FOLATE 13.1  FERRITIN 66  TIBC 227*  IRON 36  RETICCTPCT 2.1   Sepsis Labs: Recent Labs  Lab 08/07/19 1110  LATICACIDVEN 2.1*    Recent Results (from the past 240 hour(s))  SARS CORONAVIRUS 2 (TAT 6-24 HRS) Nasopharyngeal Nasopharyngeal  Swab     Status: None   Collection Time: 08/07/19 11:10 AM   Specimen: Nasopharyngeal Swab  Result Value Ref Range Status   SARS Coronavirus 2 NEGATIVE NEGATIVE Final    Comment: (NOTE) SARS-CoV-2 target nucleic acids are NOT DETECTED. The SARS-CoV-2 RNA is generally detectable in upper and lower respiratory specimens during the acute phase of infection. Negative results do not preclude SARS-CoV-2 infection, do not rule out co-infections with other pathogens, and should not be used as the sole basis for treatment or other patient management decisions. Negative results must be combined with clinical  observations, patient history, and epidemiological information. The expected result is Negative. Fact Sheet for Patients: SugarRoll.be Fact Sheet for Healthcare Providers: https://www.woods-mathews.com/ This test is not yet approved or cleared by the [place] FDA and  has been authorized for detection and/or diagnosis of SARS-CoV-2 by FDA under an Emergency Use Authorization (EUA). This EUA will remain  in effect (meaning this test can be used) for the duration of the COVID-19 declaration under Section 56 4(b)(1) of the Act, 21 U.S.C. section 360bbb-3(b)(1), unless the authorization is terminated or revoked sooner. Performed at [facility], [place] [address] [place], [place] [address]          Radiology Studies: Ct Head Wo Contrast  Result Date: 08/07/2019 CLINICAL DATA:  Minor head trauma with high clinical risk EXAM: CT HEAD WITHOUT CONTRAST TECHNIQUE: Contiguous axial images were obtained from the base of the skull through the vertex without intravenous contrast. COMPARISON:  05/23/2012 FINDINGS: Brain: No evidence of acute infarction, hemorrhage, hydrocephalus, extra-axial collection or mass lesion/mass effect. Vascular: No hyperdense vessel or unexpected calcification. Skull: Occipital cervical fusion which is partially covered.  No evidence of hardware failure. Negative for calvarial fracture. Sinuses/Orbits: Negative IMPRESSION: No acute finding or evidence of injury. Electronically Signed   By: Monte Fantasia M.D.   On: 08/07/2019 11:34   Mr Brain Wo Contrast  Result Date: 08/07/2019 CLINICAL DATA:  Episodic alteration of mental status with backwards fall earlier today. Simple syncope with normal neuro exam. Dizziness for a few weeks. History of diabetes mellitus. No reported loss of consciousness. EXAM: MRI HEAD WITHOUT CONTRAST TECHNIQUE: Multiplanar, multiecho pulse sequences of the brain and surrounding structures were obtained without intravenous contrast. COMPARISON:  CT head earlier in the day. FINDINGS: Brain: No acute infarction, hemorrhage, hydrocephalus, extra-axial collection or mass lesion. Mild atrophy. Only minor white matter disease less than expected for age. Vascular: Normal flow voids. Skull and upper cervical spine: Normal marrow signal. Sequelae of prior cervical fusion to the occiput. Sinuses/Orbits: Negative. Other: None IMPRESSION: 1. Mild atrophy. Only minor white matter disease. No acute intracranial findings. 2. No cause is seen for the reported symptoms. Electronically Signed   By: Staci Righter M.D.   On: 08/07/2019 18:21   Ct Abdomen Pelvis W Contrast  Result Date: 08/07/2019 CLINICAL DATA:  Syncope. Blunt abdominal trauma. Decreased hemoglobin. Left buttock hematoma. EXAM: CT ABDOMEN AND PELVIS WITH CONTRAST TECHNIQUE: Multidetector CT imaging of the abdomen and pelvis was performed using the standard protocol following bolus administration of intravenous contrast. CONTRAST:  116mL OMNIPAQUE IOHEXOL 300 MG/ML  SOLN COMPARISON:  None. FINDINGS: Lower chest: Mild hypoventilatory changes at the dependent lung bases. Coronary atherosclerosis. Hepatobiliary: Normal liver with no liver laceration or mass. Probable tiny layering gallstone in the otherwise normal gallbladder. No biliary ductal dilatation.  Moderate periampullary duodenal diverticulum. Pancreas: Normal, with no laceration, mass or duct dilation. Spleen: Normal size. No laceration or mass. Adrenals/Urinary Tract: Normal adrenals. No hydronephrosis. No renal laceration. Subcentimeter hypodense renal cortical lesions in the kidneys bilaterally, too small to characterize, requiring no follow-up. Bladder obscured by streak artifact from left hip hardware. Stomach/Bowel: Grossly normal stomach. Normal caliber small bowel with no small bowel wall thickening. Normal appendix. Normal large bowel with no diverticulosis, large bowel wall thickening or pericolonic fat stranding. Vascular/Lymphatic: Atherosclerotic nonaneurysmal abdominal aorta. Patent portal, splenic, hepatic and renal veins. No pathologically enlarged lymph nodes in the abdomen or pelvis. Reproductive: Status post hysterectomy, with no abnormal findings at the vaginal cuff. No adnexal  mass. Other: No pneumoperitoneum. No hemoperitoneum. There is a large ill-defined subcutaneous hematoma throughout the left gluteal region spanning up to 13.4 x 6.1 cm. No active contrast extravasation or pseudoaneurysm in the visualized portion of the left gluteal region, noting that the superficial most portion of the left gluteal region was excluded from the images due to patient's body habitus. Musculoskeletal: No aggressive appearing focal osseous lesions. No fracture in the abdomen or pelvis. Marked thoracolumbar spondylosis. Left total hip arthroplasty with no hip dislocation and no evidence of hardware fracture or loosening. Severe right hip osteoarthritis. IMPRESSION: 1. Large ill-defined subcutaneous hematoma throughout the left gluteal region. No evidence of active contrast extravasation. 2. No additional acute traumatic injuries in the abdomen or pelvis. 3. Chronic findings include: Aortic Atherosclerosis (ICD10-I70.0). Coronary atherosclerosis. Probable cholelithiasis. Severe right hip osteoarthritis.  Electronically Signed   By: Ilona Sorrel M.D.   On: 08/07/2019 12:39   Dg Chest Portable 1 View  Result Date: 08/07/2019 CLINICAL DATA:  Syncope EXAM: PORTABLE CHEST 1 VIEW COMPARISON:  03/28/2010 chest radiograph. FINDINGS: Stable cardiomediastinal silhouette with normal heart size. No pneumothorax. No pleural effusion. Lungs appear clear, with no acute consolidative airspace disease and no pulmonary edema. Severe bilateral shoulder arthritis with deformity of the humeral heads, suggestive of neuropathic arthropathy. IMPRESSION: 1. No active cardiopulmonary disease. 2. Severe bilateral shoulder arthritis with deformity of the humeral heads, suggestive of neuropathic arthropathy. Electronically Signed   By: Ilona Sorrel M.D.   On: 08/07/2019 10:57   Dg Hip Unilat W Or Wo Pelvis 2-3 Views Left  Result Date: 08/07/2019 CLINICAL DATA:  Left hip pain after fall EXAM: DG HIP (WITH OR WITHOUT PELVIS) 2-3V LEFT COMPARISON:  12/11/2015 left hip radiographs FINDINGS: Status post left total hip arthroplasty, with no evidence of hardware fracture or loosening. No left hip dislocation. No osseous left hip fracture. No pelvic fracture or diastasis. No suspicious focal osseous lesions. Severe right hip osteoarthritis. Degenerative changes in the visualized lower lumbar spine. IMPRESSION: 1. Status post left total hip arthroplasty, with no hardware complication. No osseous fracture. No left hip dislocation. 2. Severe right hip osteoarthritis. Electronically Signed   By: Ilona Sorrel M.D.   On: 08/07/2019 10:58        Scheduled Meds: . atorvastatin  40 mg Oral q1800  . fluticasone  2 spray Each Nare Daily  . gabapentin  300 mg Oral QPM  . imipramine  25 mg Oral QHS  . mirabegron ER  25 mg Oral Daily  . Netarsudil-Latanoprost  1 drop Both Eyes QHS  . sodium chloride flush  3 mL Intravenous Q12H   Continuous Infusions: . lactated ringers 75 mL/hr at 08/08/19 0348     LOS: 1 day    Time spent: 30-35  minutes    Ezekiel Slocumb, DO [facility] Pager: [phone]  If 7PM-7AM, please contact night-coverage [email or web address] Password [email or web address] 08/08/2019, 8:07 AM

## 2019-08-08 NOTE — Progress Notes (Signed)
Unable to determine what pt's home cpap settings are. Pressure set on 10 cm. Pt states it feels comfortable.

## 2019-08-08 NOTE — Plan of Care (Signed)

## 2019-08-08 NOTE — Evaluation (Addendum)
Physical Therapy Evaluation Patient Details Name: Becky Trevino MRN: [id number] DOB: 10-17-1945 Today's Date: 08/08/2019   History of Present Illness  [name] is a 34yoF who comes to [facility] after sustaining a fall at work hitting her head and right hip. Pt denies LOC. In ED pt hypotensive. Pt also reports a fall at work about 1 month ago, and reports having a 3rd fall just before coming to hospital.  Clinical Impression  Pt admitted with above diagnosis. Pt currently with functional limitations due to the deficits listed below (see "PT Problem List"). Upon entry, pt in bed, awake and agreeable to participate. Orthostatic BP taken, pt generally with hypotension throughout, continues to slowly drop while up, but pt without symptoms. The pt is alert and oriented x3, pleasant, conversational, and generally a good historian. Functional mobility assessment demonstrates increased effort/time requirements, fair tolerance, and need for physical assistance, whereas the patient performed these at a higher level of independence PTA. No additional AMB performed as BP remains soft. Pt will benefit from skilled PT intervention to increase independence and safety with basic mobility in preparation for discharge to the venue listed below.       Follow Up Recommendations Home health PT    Equipment Recommendations  None recommended by PT    Recommendations for Other Services       Precautions / Restrictions Precautions Precautions: Fall Precaution Comments: hypotensive without symptoms Restrictions Weight Bearing Restrictions: No      Mobility  Bed Mobility Overal bed mobility: Modified Independent             General bed mobility comments: heavy effort required with HOB elevated  Transfers Overall transfer level: Needs assistance Equipment used: Rolling walker (2 wheeled) Transfers: Sit to/from Stand Sit to Stand: Max assist         General transfer comment: very weak in legs,  difficulty rising from low surface even with RW; pt able to rise with assistance  Ambulation/Gait Ambulation/Gait assistance: Min guard Gait Distance (Feet): 70 Feet Assistive device: None       General Gait Details: Pre and post BP taken with continued orthostatic drop s/p AMB. Pt remains without symptoms. Additional AMB deferred.(AMB is slow, pts reports as baseline secondary to chronic DJD of hip)  Stairs            Wheelchair Mobility    Modified Rankin (Stroke Patients Only)       Balance Overall balance assessment: Modified Independent;History of Falls                                           Pertinent Vitals/Pain Pain Assessment: 0-10 Pain Score: 6  Pain Location: chronic arthritic hip; contrlateral post traumatic fall hip is not painful Pain Intervention(s): Limited activity within patient's tolerance;Monitored during session;Premedicated before session    Home Living Family/patient expects to be discharged to:: Private residence Living Arrangements: Spouse/significant other Available Help at Discharge: Family(retired husband works part time; DTR check in on them regularly.) Type of Home: Apartment Home Access: Level entry     Home Layout: One level Home Equipment: Environmental consultant - 2 wheels;Cane - single point;Shower seat      Prior Function Level of Independence: Independent(Pt has a bad Right hip but is unable to have a hip replacement because on baseline anticoagulation.)         Comments: a fall at work  last month, and 2 PTA     Hand Dominance   Dominant Hand: Right    Extremity/Trunk Assessment   Upper Extremity Assessment Upper Extremity Assessment: Overall WFL for tasks assessed;Generalized weakness    Lower Extremity Assessment Lower Extremity Assessment: Generalized weakness;Overall WFL for tasks assessed       Communication   Communication: No difficulties  Cognition Arousal/Alertness: Awake/alert Behavior During  Therapy: WFL for tasks assessed/performed Overall Cognitive Status: Within Functional Limits for tasks assessed                                        General Comments      Exercises     Assessment/Plan    PT Assessment Patient needs continued PT services  PT Problem List Decreased strength;Decreased activity tolerance;Decreased mobility;Decreased coordination;Decreased knowledge of precautions;Obesity       PT Treatment Interventions DME instruction;Gait training;Stair training;Functional mobility training;Therapeutic activities;Therapeutic exercise;Patient/family education;Balance training    PT Goals (Current goals can be found in the Care Plan section)  Acute Rehab PT Goals Patient Stated Goal: Regain strength, return to work PT Goal Formulation: With patient Time For Goal Achievement: 08/23/19 Potential to Achieve Goals: Good    Frequency Min 2X/week   Barriers to discharge        Co-evaluation               AM-PAC PT "6 Clicks" Mobility  Outcome Measure Help needed turning from your back to your side while in a flat bed without using bedrails?: None Help needed moving from lying on your back to sitting on the side of a flat bed without using bedrails?: A Little Help needed moving to and from a bed to a chair (including a wheelchair)?: A Lot Help needed standing up from a chair using your arms (e.g., wheelchair or bedside chair)?: A Lot Help needed to walk in hospital room?: A Little Help needed climbing 3-5 steps with a railing? : A Lot 6 Click Score: 16    End of Session Equipment Utilized During Treatment: Gait belt Activity Tolerance: Patient tolerated treatment well;Treatment limited secondary to medical complications (Comment)(orthostatic BP) Patient left: in bed;with nursing/sitter in room;with call bell/phone within reach Nurse Communication: Mobility status PT Visit Diagnosis: Unsteadiness on feet (R26.81);Muscle weakness  (generalized) (M62.81);History of falling (Z91.81);Other abnormalities of gait and mobility (R26.89);Difficulty in walking, not elsewhere classified (R26.2);Other symptoms and signs involving the nervous system (R29.898)    Time: [id number] PT Time Calculation (min) (ACUTE ONLY): 22 min   Charges:   PT Evaluation $PT Eval Moderate Complexity: 1 Mod         1:02 PM, 08/08/19 Etta Grandchild, PT, DPT Physical Therapist - [facility]  [phone] ([place])   Add [place] C 08/08/2019, 1:00 PM

## 2019-08-08 NOTE — Progress Notes (Signed)
Patient admitted for syncope. Blood pressure medications are being held. MEWs is based on orthostatic vitals.

## 2019-08-09 LAB — BASIC METABOLIC PANEL
Anion gap: 4 — ABNORMAL LOW (ref 5–15)
BUN: 14 mg/dL (ref 8–23)
CO2: 27 mmol/L (ref 22–32)
Calcium: 8.4 mg/dL — ABNORMAL LOW (ref 8.9–10.3)
Chloride: 108 mmol/L (ref 98–111)
Creatinine, Ser: 1.01 mg/dL — ABNORMAL HIGH (ref 0.44–1.00)
GFR calc Af Amer: >60 mL/min (ref >60)
GFR calc non Af Amer: 55 mL/min — ABNORMAL LOW (ref >60)
Glucose, Bld: 98 mg/dL (ref 70–99)
Potassium: 4.4 mmol/L (ref 3.5–5.1)
Sodium: 139 mmol/L (ref 135–145)

## 2019-08-09 LAB — HEMOGLOBIN AND HEMATOCRIT, BLOOD
HCT: 26 % — ABNORMAL LOW (ref 36.0–46.0)
Hemoglobin: 8.1 g/dL — ABNORMAL LOW (ref 12.0–15.0)

## 2019-08-09 LAB — GLUCOSE, CAPILLARY: Glucose-Capillary: 86 mg/dL (ref 70–99)

## 2019-08-09 LAB — LACTIC ACID, PLASMA: Lactic Acid, Venous: 1.4 mmol/L (ref 0.5–1.9)

## 2019-08-09 LAB — CBC
HCT: 25.1 % — ABNORMAL LOW (ref 36.0–46.0)
Hemoglobin: 7.8 g/dL — ABNORMAL LOW (ref 12.0–15.0)
MCH: 30.5 pg (ref 26.0–34.0)
MCHC: 31.1 g/dL (ref 30.0–36.0)
MCV: 98 fL (ref 80.0–100.0)
Platelets: 175 10*3/uL (ref 150–400)
RBC: 2.56 MIL/uL — ABNORMAL LOW (ref 3.87–5.11)
RDW: 16.1 % — ABNORMAL HIGH (ref 11.5–15.5)
WBC: 5.5 10*3/uL (ref 4.0–10.5)
nRBC: 0.4 % — ABNORMAL HIGH (ref 0.0–0.2)

## 2019-08-09 MED ORDER — MIDODRINE HCL 5 MG PO TABS: 2.5000 mg | ORAL_TABLET | Freq: Three times a day (TID) | ORAL | Status: DC

## 2019-08-09 MED ORDER — MIDODRINE HCL 5 MG PO TABS: 5.0000 mg | ORAL_TABLET | Freq: Three times a day (TID) | ORAL | 0 refills | Status: DC

## 2019-08-09 MED ORDER — MIDODRINE HCL 5 MG PO TABS
5.0000 mg | ORAL_TABLET | Freq: Three times a day (TID) | ORAL | Status: DC
  Administered 2019-08-09: 5 mg via ORAL
  Filled 2019-08-09: qty 1

## 2019-08-09 NOTE — Discharge Summary (Addendum)
Physician Discharge Summary  Becky Trevino [id number] DOB: [birth year] DOA: 08/07/2019  PCP: [name], MD  Admit date: 08/07/2019 Discharge date: 08/09/2019  Admitted From: home Disposition:  home  Recommendations for Outpatient Follow-up:  1. Follow up with PCP in 1-2 weeks 2. Please obtain BMP/CBC in one week 3. Please follow with cardiology in 1-2 weeks  Home Health: No  Equipment/Devices: None   Discharge Condition: Stable  CODE STATUS: Full  Diet recommendation: Heart Healthy, Carb Modified    Brief/Interim Summary:  73 y.o.femalewith medical history ofDM II, DVT Rt leg diagnosed this year in August (on Xarelto), presented to the ED 11/22 after a fall and apparent near syncopal episode.  She had been dizzy for about 2 weeks, had episode of dizziness prior to falling.  No loss of consciousness.  She fell backwards onto a piece of furniture which struck her left buttock.  In the ED, vitals stable.  Labs notable for anemia (Hbg 9.5), creatinine 1.43.  Xarelto held.  Orthostatic vitals positive.  Admitted for observation and evaluation of anemia and syncope/near-syncope.  Found to have orthostatic hypotension, but also with persistently borderline blood pressures during admission despite home BP meds held.  BP improved with IV fluids.  Off fluids, remained with soft BP.  Started midodrine with good response.    Given patient's anemia present on admission, CT abdomen/pelvis to evaluate after trauma, it showed a left gluteal hematoma without signs of extravasation.  Hematoma remained stable, reached nadir of 7.8 and improved.  Did not require  Transfusion.  Xarelto was held, to be resumed if hematoma stable at PCP follow up.   Discharge Diagnoses: Principal Problem:   Syncope Active Problems:   Benign essential HTN   Type 2 diabetes mellitus with diabetic neuropathy, without long-term current use of insulin (HCC)   Anemia  Syncopeand Collapse - secondary to  orthostatic hypotension.  Troponin negative and no ischemic changes on ECG.  No hypoxia to suggest PE.  Head CT and MRI brain without acute abnormalities.  Unlikely seizure by history, no need for EEG at this time.  Echo showed EF 60-65%.  Carotid dopplers negative for any significant stenosis.  No significant arrhythmias on telemetry.  Orthostatic Hypotension - started midodrine with improvement.  Advised to stay well-hydrated.  Caution on standing.  AKI superimposed on CKD- BUN/Cr elevated above baseline - Hold Lasix -  - Avoid Nephrotoxins - Gentle IVFs hydration - Continue to monitor renal function  History of DVT - right LE.  On Xarelto. - hold Xarelto given anemia and trauma with fall causing gluteal hematoma. Resume if hematoma stable at 1 week,   Discharge Instructions   Discharge Instructions    Call MD for:  extreme fatigue   Complete by: As directed    Call MD for:  persistant dizziness or light-headedness   Complete by: As directed    Diet - low sodium heart healthy   Complete by: As directed    Discharge instructions   Complete by: As directed    1) Please monitor your blood pressure at home, write down readings and bring to follow up doctor appointments. 2) Stay well hydrated 3) Be cautious when standing up, make sure not feeling lightheaded or dizzy before walking around.  If you feel dizzy or lightheaded, please sit or lay down immediately to prevent fall 4) I started midodrine, a medication to keep your blood pressure from falling too low   Increase activity slowly   Complete by: As directed  Allergies as of 08/09/2019      Reactions   Lisinopril Swelling      Medication List    STOP taking these medications   furosemide 20 MG tablet Commonly known as: LASIX   Klor-Con 10 10 MEQ tablet Generic drug: potassium chloride   metoprolol succinate 100 MG 24 hr tablet Commonly known as: TOPROL-XL   Xarelto 20 MG Tabs tablet Generic drug:  rivaroxaban     TAKE these medications   acetaminophen 650 MG CR tablet Commonly known as: TYLENOL Take 650-1,300 mg by mouth 2 (two) times daily as needed for pain.   atorvastatin 40 MG tablet Commonly known as: LIPITOR Take 1 tablet (40 mg total) by mouth daily at 6 PM.   Calcium 600-D 600-400 MG-UNIT tablet Generic drug: Calcium Carbonate-Vitamin D Take 1 tablet by mouth 2 (two) times daily.   celecoxib 200 MG capsule Commonly known as: CELEBREX Take 200 mg by mouth daily as needed for moderate pain.   doxycycline 100 MG tablet Commonly known as: VIBRA-TABS Take 100 mg by mouth 2 (two) times daily.   fluticasone 50 MCG/ACT nasal spray Commonly known as: FLONASE Place 2 sprays into both nostrils daily. What changed:   when to take this  reasons to take this   gabapentin 300 MG capsule Commonly known as: NEURONTIN Take 1 capsule (300 mg total) by mouth every evening.   glucose blood test strip Commonly known as: ONE TOUCH ULTRA TEST Use as instructed   imipramine 25 MG tablet Commonly known as: TOFRANIL Take 1 tablet (25 mg total) by mouth at bedtime.   ketoconazole 2 % cream Commonly known as: NIZORAL Apply 1 application topically 2 (two) times daily as needed (rash).   Magnesium Oxide 500 MG Caps Take 500 mg by mouth daily.   midodrine 5 MG tablet Commonly known as: PROAMATINE Take 1 tablet (5 mg total) by mouth 3 (three) times daily with meals.   mirabegron ER 25 MG Tb24 tablet Commonly known as: Myrbetriq Take 1 tablet (25 mg total) by mouth daily.   Rocklatan 0.02-0.005 % Soln Generic drug: Netarsudil-Latanoprost Place 1 drop into both eyes at bedtime.   traZODone 50 MG tablet Commonly known as: DESYREL TAKE 1 TABLET BY MOUTH AT BEDTIME AS NEEDED FOR SLEEP What changed:   how much to take  how to take this  when to take this  reasons to take this  additional instructions      Follow-up Information    [name], MD. Schedule  an appointment as soon as possible for a visit in 1 week(s).   Specialty: Family Medicine Contact information: [address] [place] [address] [phone]        Yolonda Kida, MD. Schedule an appointment as soon as possible for a visit in 2 week(s).   Specialties: Cardiology, Internal Medicine Contact information: [address] [place] [address] [phone]          Allergies  Allergen Reactions  . Lisinopril Swelling    Consultations:  None    Procedures/Studies: Ct Head Wo Contrast  Result Date: 08/07/2019 CLINICAL DATA:  Minor head trauma with high clinical risk EXAM: CT HEAD WITHOUT CONTRAST TECHNIQUE: Contiguous axial images were obtained from the base of the skull through the vertex without intravenous contrast. COMPARISON:  05/23/2012 FINDINGS: Brain: No evidence of acute infarction, hemorrhage, hydrocephalus, extra-axial collection or mass lesion/mass effect. Vascular: No hyperdense vessel or unexpected calcification. Skull: Occipital cervical fusion which is partially  covered. No evidence of hardware failure. Negative for calvarial fracture. Sinuses/Orbits: Negative IMPRESSION: No acute finding or evidence of injury. Electronically Signed   By: Monte Fantasia M.D.   On: 08/07/2019 11:34   Mr Brain Wo Contrast  Result Date: 08/07/2019 CLINICAL DATA:  Episodic alteration of mental status with backwards fall earlier today. Simple syncope with normal neuro exam. Dizziness for a few weeks. History of diabetes mellitus. No reported loss of consciousness. EXAM: MRI HEAD WITHOUT CONTRAST TECHNIQUE: Multiplanar, multiecho pulse sequences of the brain and surrounding structures were obtained without intravenous contrast. COMPARISON:  CT head earlier in the day. FINDINGS: Brain: No acute infarction, hemorrhage, hydrocephalus, extra-axial collection or mass lesion. Mild atrophy. Only minor white matter disease less than expected for age.  Vascular: Normal flow voids. Skull and upper cervical spine: Normal marrow signal. Sequelae of prior cervical fusion to the occiput. Sinuses/Orbits: Negative. Other: None IMPRESSION: 1. Mild atrophy. Only minor white matter disease. No acute intracranial findings. 2. No cause is seen for the reported symptoms. Electronically Signed   By: Staci Righter M.D.   On: 08/07/2019 18:21   Ct Abdomen Pelvis W Contrast  Result Date: 08/07/2019 CLINICAL DATA:  Syncope. Blunt abdominal trauma. Decreased hemoglobin. Left buttock hematoma. EXAM: CT ABDOMEN AND PELVIS WITH CONTRAST TECHNIQUE: Multidetector CT imaging of the abdomen and pelvis was performed using the standard protocol following bolus administration of intravenous contrast. CONTRAST:  172mL OMNIPAQUE IOHEXOL 300 MG/ML  SOLN COMPARISON:  None. FINDINGS: Lower chest: Mild hypoventilatory changes at the dependent lung bases. Coronary atherosclerosis. Hepatobiliary: Normal liver with no liver laceration or mass. Probable tiny layering gallstone in the otherwise normal gallbladder. No biliary ductal dilatation. Moderate periampullary duodenal diverticulum. Pancreas: Normal, with no laceration, mass or duct dilation. Spleen: Normal size. No laceration or mass. Adrenals/Urinary Tract: Normal adrenals. No hydronephrosis. No renal laceration. Subcentimeter hypodense renal cortical lesions in the kidneys bilaterally, too small to characterize, requiring no follow-up. Bladder obscured by streak artifact from left hip hardware. Stomach/Bowel: Grossly normal stomach. Normal caliber small bowel with no small bowel wall thickening. Normal appendix. Normal large bowel with no diverticulosis, large bowel wall thickening or pericolonic fat stranding. Vascular/Lymphatic: Atherosclerotic nonaneurysmal abdominal aorta. Patent portal, splenic, hepatic and renal veins. No pathologically enlarged lymph nodes in the abdomen or pelvis. Reproductive: Status post hysterectomy, with no  abnormal findings at the vaginal cuff. No adnexal mass. Other: No pneumoperitoneum. No hemoperitoneum. There is a large ill-defined subcutaneous hematoma throughout the left gluteal region spanning up to 13.4 x 6.1 cm. No active contrast extravasation or pseudoaneurysm in the visualized portion of the left gluteal region, noting that the superficial most portion of the left gluteal region was excluded from the images due to patient's body habitus. Musculoskeletal: No aggressive appearing focal osseous lesions. No fracture in the abdomen or pelvis. Marked thoracolumbar spondylosis. Left total hip arthroplasty with no hip dislocation and no evidence of hardware fracture or loosening. Severe right hip osteoarthritis. IMPRESSION: 1. Large ill-defined subcutaneous hematoma throughout the left gluteal region. No evidence of active contrast extravasation. 2. No additional acute traumatic injuries in the abdomen or pelvis. 3. Chronic findings include: Aortic Atherosclerosis (ICD10-I70.0). Coronary atherosclerosis. Probable cholelithiasis. Severe right hip osteoarthritis. Electronically Signed   By: Ilona Sorrel M.D.   On: 08/07/2019 12:39   US Carotid Bilateral  Result Date: 08/08/2019 CLINICAL DATA:  Syncope, collapse. Hypertension, hyperlipidemia, diabetes. EXAM: BILATERAL CAROTID DUPLEX ULTRASOUND COMPLETE TRANSCRANIAL DOPPLER ULTRASOUND TECHNIQUE: Pearline Cables scale imaging, color Doppler and duplex ultrasound  were performed of bilateral carotid and vertebral arteries in the neck. Additional color Doppler and duplex ultrasound were performed of the intracranial arteries via transorbital, transtemporal and suboccipital transcranial windows. COMPARISON:  None. FINDINGS: CAROTID Criteria: Quantification of carotid stenosis is based on velocity parameters that correlate the residual internal carotid diameter with NASCET-based stenosis levels, using the diameter of the distal internal carotid lumen as the denominator for  stenosis measurement. The following velocity measurements were obtained: RIGHT ICA: 129/51 cm/sec CCA: [id number] cm/sec SYSTOLIC ICA/CCA RATIO:  1.0 ECA:  105 cm/sec LEFT ICA: 130/40 cm/sec CCA: [id number] cm/sec SYSTOLIC ICA/CCA RATIO:  0.5 ECA:  96 cm/sec RIGHT CAROTID ARTERY: Mild intimal thickening in the bulb. No significant plaque accumulation or stenosis. Normal waveforms and color Doppler signal. Mild tortuosity of the ICA. RIGHT VERTEBRAL ARTERY:  Normal flow direction and waveform. LEFT CAROTID ARTERY: Mild tortuosity. No significant plaque or stenosis. Normal waveforms and color Doppler signal. LEFT VERTEBRAL ARTERY:  Normal flow direction and waveform. IMPRESSION: 1. No significant carotid plaque or stenosis. 2.  Antegrade bilateral vertebral arterial flow. Electronically Signed   By: Lucrezia Europe M.D.   On: 08/08/2019 09:38   Dg Chest Portable 1 View  Result Date: 08/07/2019 CLINICAL DATA:  Syncope EXAM: PORTABLE CHEST 1 VIEW COMPARISON:  03/28/2010 chest radiograph. FINDINGS: Stable cardiomediastinal silhouette with normal heart size. No pneumothorax. No pleural effusion. Lungs appear clear, with no acute consolidative airspace disease and no pulmonary edema. Severe bilateral shoulder arthritis with deformity of the humeral heads, suggestive of neuropathic arthropathy. IMPRESSION: 1. No active cardiopulmonary disease. 2. Severe bilateral shoulder arthritis with deformity of the humeral heads, suggestive of neuropathic arthropathy. Electronically Signed   By: Ilona Sorrel M.D.   On: 08/07/2019 10:57   Dg Hip Unilat W Or Wo Pelvis 2-3 Views Left  Result Date: 08/07/2019 CLINICAL DATA:  Left hip pain after fall EXAM: DG HIP (WITH OR WITHOUT PELVIS) 2-3V LEFT COMPARISON:  12/11/2015 left hip radiographs FINDINGS: Status post left total hip arthroplasty, with no evidence of hardware fracture or loosening. No left hip dislocation. No osseous left hip fracture. No pelvic fracture or diastasis. No suspicious  focal osseous lesions. Severe right hip osteoarthritis. Degenerative changes in the visualized lower lumbar spine. IMPRESSION: 1. Status post left total hip arthroplasty, with no hardware complication. No osseous fracture. No left hip dislocation. 2. Severe right hip osteoarthritis. Electronically Signed   By: Ilona Sorrel M.D.   On: 08/07/2019 10:58      Echo EF 60-65%  Carotid Doppler U/S no significant stenosis    Subjective: Patient seen awake in bed.  Reports feeling better.  Buttock pain/contusion still achy but a little better. No events overnight.   Discharge Exam: Vitals:   08/09/19 0934 08/09/19 1111  BP: 91/60 116/63  Pulse: (!) 115 82  Resp:    Temp:    SpO2: 100%    Vitals:   08/09/19 0929 08/09/19 0932 08/09/19 0934 08/09/19 1111  BP: (!) 96/45 (!) 106/52 91/60 116/63  Pulse: 89 87 (!) 115 82  Resp:      Temp:      TempSrc:      SpO2: 100% 98% 100%   Weight:      Height:        General: Pt is alert, awake, not in acute distress Cardiovascular: RRR, S1/S2 +, no rubs, no gallops Respiratory: CTA bilaterally, no wheezing, no rhonchi Abdominal: Soft, NT, ND, bowel sounds + Extremities: b/l LE edema, no cyanosis  The results of significant diagnostics from this hospitalization (including imaging, microbiology, ancillary and laboratory) are listed below for reference.     Microbiology: Recent Results (from the past 240 hour(s))  SARS CORONAVIRUS 2 (TAT 6-24 HRS) Nasopharyngeal Nasopharyngeal Swab     Status: None   Collection Time: 08/07/19 11:10 AM   Specimen: Nasopharyngeal Swab  Result Value Ref Range Status   SARS Coronavirus 2 NEGATIVE NEGATIVE Final    Comment: (NOTE) SARS-CoV-2 target nucleic acids are NOT DETECTED. The SARS-CoV-2 RNA is generally detectable in upper and lower respiratory specimens during the acute phase of infection. Negative results do not preclude SARS-CoV-2 infection, do not rule out co-infections with other pathogens, and  should not be used as the sole basis for treatment or other patient management decisions. Negative results must be combined with clinical observations, patient history, and epidemiological information. The expected result is Negative. Fact Sheet for Patients: SugarRoll.be Fact Sheet for Healthcare Providers: https://www.woods-mathews.com/ This test is not yet approved or cleared by the [place] FDA and  has been authorized for detection and/or diagnosis of SARS-CoV-2 by FDA under an Emergency Use Authorization (EUA). This EUA will remain  in effect (meaning this test can be used) for the duration of the COVID-19 declaration under Section 56 4(b)(1) of the Act, 21 U.S.C. section 360bbb-3(b)(1), unless the authorization is terminated or revoked sooner. Performed at [facility], [place] [address], [place], [place] [address]      Labs: BNP (last 3 results) Recent Labs    04/29/19 1119  BNP 0000000*   Basic Metabolic Panel: Recent Labs  Lab 08/07/19 0952 08/07/19 1709 08/09/19 0528  NA 140  --  139  K 4.4  --  4.4  CL 106  --  108  CO2 25  --  27  GLUCOSE 113*  --  98  BUN 27*  --  14  CREATININE 1.43*  --  1.01*  CALCIUM 8.6*  --  8.4*  MG  --  2.4  --   PHOS  --  3.5  --    Liver Function Tests: Recent Labs  Lab 08/07/19 0952  AST 22  ALT 16  ALKPHOS 97  BILITOT 0.7  PROT 6.8  ALBUMIN 3.4*   No results for input(s): LIPASE, AMYLASE in the last 168 hours. No results for input(s): AMMONIA in the last 168 hours. CBC: Recent Labs  Lab 08/07/19 0952 08/07/19 1709 08/08/19 0021 08/08/19 0746 08/09/19 0528 08/09/19 1058  WBC 6.7  --   --   --  5.5  --   NEUTROABS 4.8  --   --   --   --   --   HGB 9.5* 8.8* 8.3* 8.6* 7.8* 8.1*  HCT 29.9*  --   --   --  25.1* 26.0*  MCV 97.1  --   --   --  98.0  --   PLT 188  --   --   --  175  --    Cardiac Enzymes: No results for input(s): CKTOTAL, CKMB, CKMBINDEX,  TROPONINI in the last 168 hours. BNP: Invalid input(s): POCBNP CBG: Recent Labs  Lab 08/08/19 0806 08/09/19 0808  GLUCAP 66* 86   D-Dimer No results for input(s): DDIMER in the last 72 hours. Hgb A1c No results for input(s): HGBA1C in the last 72 hours. Lipid Profile No results for input(s): CHOL, HDL, LDLCALC, TRIG, CHOLHDL, LDLDIRECT in the last 72 hours. Thyroid function studies Recent Labs    08/07/19  1202  TSH 2.837   Anemia work up Recent Labs    08/07/19 1709  VITAMINB12 444  FOLATE 13.1  FERRITIN 66  TIBC 227*  IRON 36  RETICCTPCT 2.1   Urinalysis    Component Value Date/Time   COLORURINE YELLOW (A) 11/17/2017 1510   APPEARANCEUR CLOUDY (A) 11/17/2017 1510   LABSPEC 1.019 11/17/2017 1510   PHURINE 6.0 11/17/2017 1510   GLUCOSEU NEGATIVE 11/17/2017 1510   HGBUR SMALL (A) 11/17/2017 1510   BILIRUBINUR NEGATIVE 11/17/2017 1510   KETONESUR NEGATIVE 11/17/2017 1510   PROTEINUR NEGATIVE 11/17/2017 1510   NITRITE NEGATIVE 11/17/2017 1510   LEUKOCYTESUR NEGATIVE 11/17/2017 1510   Sepsis Labs Invalid input(s): PROCALCITONIN,  WBC,  LACTICIDVEN Microbiology Recent Results (from the past 240 hour(s))  SARS CORONAVIRUS 2 (TAT 6-24 HRS) Nasopharyngeal Nasopharyngeal Swab     Status: None   Collection Time: 08/07/19 11:10 AM   Specimen: Nasopharyngeal Swab  Result Value Ref Range Status   SARS Coronavirus 2 NEGATIVE NEGATIVE Final    Comment: (NOTE) SARS-CoV-2 target nucleic acids are NOT DETECTED. The SARS-CoV-2 RNA is generally detectable in upper and lower respiratory specimens during the acute phase of infection. Negative results do not preclude SARS-CoV-2 infection, do not rule out co-infections with other pathogens, and should not be used as the sole basis for treatment or other patient management decisions. Negative results must be combined with clinical observations, patient history, and epidemiological information. The expected result is  Negative. Fact Sheet for Patients: SugarRoll.be Fact Sheet for Healthcare Providers: https://www.woods-mathews.com/ This test is not yet approved or cleared by the [place] FDA and  has been authorized for detection and/or diagnosis of SARS-CoV-2 by FDA under an Emergency Use Authorization (EUA). This EUA will remain  in effect (meaning this test can be used) for the duration of the COVID-19 declaration under Section 56 4(b)(1) of the Act, 21 U.S.C. section 360bbb-3(b)(1), unless the authorization is terminated or revoked sooner. Performed at [facility], [place] [address], [place], [place] [address]      Time coordinating discharge: Over 30 minutes  SIGNED:   Ezekiel Slocumb, DO [facility] 08/09/2019, 11:28 AM Pager [phone]  If 7PM-7AM, please contact night-coverage [email or web address] Password [email or web address]

## 2019-08-09 NOTE — TOC Transition Note (Signed)
Transition of Care [facility]) - CM/SW Discharge Note   Patient Details  Name: SOKHA CINCO MRN: [id number] Date of Birth: September 06, 1946  [facility]) CM/SW Contact:  Ross Ludwig, LCSW Phone Number: 08/09/2019, 1:07 PM   Clinical Narrative:     Patient is a 73 year old female who is alert and oriented x4.  Patient states she had a fall at work, but it was not through [employer], she closed it herself, patient had a syncope episode.  Patient will be discharging home with home health through [facility].  CSW contacted Malachy Mood and she can provide home health PT.   Final next level of care: [place] Barriers to Discharge: Barriers Resolved   Patient Goals and CMS Choice Patient states their goals for this hospitalization and ongoing recovery are:: To return back home with home health CMS Medicare.gov Compare Post Acute Care list provided to:: Patient Choice offered to / list presented to : Patient  Discharge Placement   Patient discharging back home with home health.                     Discharge Plan and [place] with home health.              DME Arranged: N/A DME Agency: NA       HH Arranged: PT HH Agency: [place]   Time HH Agency Contacted: 1200 Representative spoke with at [place]: [place] (Adrian) Interventions     Readmission Risk Interventions No flowsheet data found.

## 2019-08-09 NOTE — Care Management (Signed)
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 RN contacted [employer] office and was directed to [employer] @ [phone]. RN Spoke with Melissa who states this patient has no open WC claims but transferred to [place], Kathreen Cornfield ext [phone]. Per Mortimer Fries this patient has no active WC claim as previous WC claim was BP related and patient closed the claim herself.

## 2019-08-10 ENCOUNTER — Ambulatory Visit: Payer: BC Managed Care – PPO | Admitting: Family Medicine

## 2019-08-10 LAB — TYPE AND SCREEN
ABO/RH(D): A POS
Antibody Screen: NEGATIVE

## 2019-08-11 DIAGNOSIS — S0003XD Contusion of scalp, subsequent encounter: Secondary | ICD-10-CM

## 2019-08-11 DIAGNOSIS — T148XXA Other injury of unspecified body region, initial encounter: Secondary | ICD-10-CM

## 2019-08-15 ENCOUNTER — Ambulatory Visit: Payer: Self-pay | Admitting: *Deleted

## 2019-08-15 DIAGNOSIS — I10 Essential (primary) hypertension: Secondary | ICD-10-CM

## 2019-08-15 NOTE — Chronic Care Management (AMB) (Signed)
  Chronic Care Management   Note  08/15/2019 Name: Becky Trevino MRN: [id number] DOB: 14-Jan-1946    This RNCM is filling in for [facility] RN,   Placed a call to patient to follow up on Red EMMI flag from general discharge call from recent hospitalization. Patient had "No" to the question "Know who to call about changes in condition?".  Talked with patient she states she does now have the numbers for the physicians she is to follow up with. Discussed briefly how she was feeling. She states "when I stand up my heart feels like it is beating fast?" also cntinuing to feel some dizziness. Patient reported blood pressures: 133/77 and 129/77.  Patient has cardiology appointment with Dr. Clayborn Bigness Dec 4 and PCP appointment Dec 8.  Reminded patient to stand slowly and encouraged her to talk with Dr. Clayborn Bigness abou there need for Lennette Bihari which was d/c'd at hospital discharge.  Patient encouraged to call PCP or Cardiology office for questions or concerns.  Made patient aware Chronic care Management team would be reaching out to follow up.   Follow up plan: The care management team will reach out to the patient again over the next 30 days.  The patient has been provided with contact information for the care management team and has been advised to call with any health related questions or concerns.     Rutherford Limerick RN, Theme park manager [facility] Care Management  [phone]) Business Mobile

## 2019-08-15 NOTE — Patient Outreach (Signed)
Assigned Red flag Emmi to [employer], sending message to Yardley Minor who is covering for [facility].

## 2019-08-17 DIAGNOSIS — E089 Diabetes mellitus due to underlying condition without complications: Secondary | ICD-10-CM | POA: Diagnosis not present

## 2019-08-17 DIAGNOSIS — G4730 Sleep apnea, unspecified: Secondary | ICD-10-CM | POA: Diagnosis not present

## 2019-08-17 DIAGNOSIS — R55 Syncope and collapse: Secondary | ICD-10-CM | POA: Diagnosis not present

## 2019-08-17 DIAGNOSIS — E7849 Other hyperlipidemia: Secondary | ICD-10-CM | POA: Diagnosis not present

## 2019-08-17 DIAGNOSIS — I10 Essential (primary) hypertension: Secondary | ICD-10-CM | POA: Diagnosis not present

## 2019-08-23 ENCOUNTER — Ambulatory Visit (INDEPENDENT_AMBULATORY_CARE_PROVIDER_SITE_OTHER): Payer: BC Managed Care – PPO | Admitting: Family Medicine

## 2019-08-23 ENCOUNTER — Other Ambulatory Visit: Payer: Self-pay

## 2019-08-23 ENCOUNTER — Encounter: Payer: Self-pay | Admitting: Family Medicine

## 2019-08-23 VITALS — BP 110/66 | HR 95 | Temp 96.9°F | Resp 16 | Ht 71.0 in | Wt 250.6 lb

## 2019-08-23 DIAGNOSIS — I10 Essential (primary) hypertension: Secondary | ICD-10-CM | POA: Diagnosis not present

## 2019-08-23 DIAGNOSIS — E1140 Type 2 diabetes mellitus with diabetic neuropathy, unspecified: Secondary | ICD-10-CM

## 2019-08-23 DIAGNOSIS — I8002 Phlebitis and thrombophlebitis of superficial vessels of left lower extremity: Secondary | ICD-10-CM

## 2019-08-23 DIAGNOSIS — D509 Iron deficiency anemia, unspecified: Secondary | ICD-10-CM

## 2019-08-23 DIAGNOSIS — Z09 Encounter for follow-up examination after completed treatment for conditions other than malignant neoplasm: Secondary | ICD-10-CM | POA: Diagnosis not present

## 2019-08-23 LAB — POCT GLYCOSYLATED HEMOGLOBIN (HGB A1C): Hemoglobin A1C: 5.1 % (ref 4.0–5.6)

## 2019-08-23 NOTE — Progress Notes (Signed)
Name: Becky Trevino   MRN: [id number]    DOB: Sep 05, 1946   Date:08/23/2019       Progress Note  Subjective  Chief Complaint  Chief Complaint  Patient presents with  . Hospitalization Follow-up  . blood clot    concerns about whether or not clot has dissloved in her leg.    [facility] discharge follow up: she had a syncopal episode likely due to orthostatic hypotension on 08/07/2019. She hit her buttock on furniture as she fell backwards, it happened while at work, it was third episode in a period of 2 weeks. Doing hospital stay she was given IV fluids, bp medication and Xarelto were held. She had a normal Echo, carotid dopplers ( mild plaque formation and tortuosizy ) MRI brain showed mild atrophy , and CT brain , had CT abdomen and pelvis done because of anemia and was found to have a hematoma on left buttocks, also showed atherosclerosis of aorta, right hip OA. She was given Midodrine  because of hypotension. She saw Dr. Clayborn Bigness on Dec 2nd and is now wearing a holter monitor for 2 weeks. No episode of syncope since hospital discharge. She continues to have pain on left buttocks but bruising is going down. She has a follow up with Dr. Clayborn Bigness on January 7 th, we will post-pone return to work until her next visit with him   Thrombophlebitis: diagnosed back in 05/12/2019, but negative for DVT, started on Xarelto by Urgent care, there was an annotation from radiologist that suggested checking for higher clotting but when the order was placed radiologist contacted me stating very rare and it was not done. Since hematoma we will stop Xarelto and monitor    Patient Active Problem List   Diagnosis Date Noted  . Hematoma   . Syncope 08/07/2019  . Anemia 08/07/2019  . Chronic infection of right knee ([place]) 11/25/2018  . Infection and inflammatory reaction due to internal joint prosthesis, subsequent encounter 01/25/2018  . Infection of total right knee replacement ([place]) 11/19/2017  . Type 2  diabetes mellitus with diabetic neuropathy, without long-term current use of insulin ([place]) 11/06/2016  . Primary osteoarthritis of knee 03/11/2016  . Primary osteoarthritis of left hip 12/11/2015  . Benign essential HTN 04/21/2015  . Edema leg 04/21/2015  . Chronic kidney disease (CKD), stage III (moderate) 04/21/2015  . Chronic venous insufficiency 04/21/2015  . DDD (degenerative disc disease), cervical 04/21/2015  . Diabetes mellitus with renal manifestation ([place]) 04/21/2015  . Dyslipidemia 04/21/2015  . Bladder cystocele 04/21/2015  . Urinary incontinence 04/21/2015  . Leg varices 04/21/2015  . Insomnia 04/21/2015  . Eczema intertrigo 04/21/2015  . Obesity, morbid ([place]) 04/21/2015  . Obstructive apnea 04/21/2015  . Menopausal and perimenopausal disorder 04/21/2015  . Supraventricular premature beats 04/21/2015  . Osteoarthritis of multiple joints 04/21/2015  . H/O Spinal surgery 11/14/2013  . Detrusor muscle hypertonia 11/07/2013    Past Surgical History:  Procedure Laterality Date  . ABDOMINAL HYSTERECTOMY  1993   Total  . BUNIONECTOMY Bilateral 1993  . I&D KNEE WITH POLY EXCHANGE Right 11/19/2017   Procedure: RIGHT KNEE POLY EXCHANGE WITH IRRIGATION AND DEBRIDEMENT;  Surgeon: Hessie Knows, MD;  Location: [facility] ORS;  Service: Orthopedics;  Laterality: Right;  . JOINT REPLACEMENT     bilateral knee  . LAMINECTOMY  11/14/2013    Cervical Fusion , [facility], Dr. Delilah Shan  . LASER ABLATION Bilateral 07/29/2012   Dr. Lucky Cowboy  . REPLACEMENT TOTAL KNEE Left 03/2010   Dr.  Menz  . TOTAL HIP ARTHROPLASTY Left 12/11/2015   Procedure: TOTAL HIP ARTHROPLASTY ANTERIOR APPROACH;  Surgeon: Hessie Knows, MD;  Location: [facility] ORS;  Service: Orthopedics;  Laterality: Left;  . TOTAL KNEE ARTHROPLASTY Right 03/11/2016   Procedure: TOTAL KNEE ARTHROPLASTY;  Surgeon: Hessie Knows, MD;  Location: [facility] ORS;  Service: Orthopedics;  Laterality: Right;    Family History  Problem Relation Age of Onset  . Diabetes  Mother   . Hyperlipidemia Mother   . Hypertension Mother   . Diabetes Father   . Hyperlipidemia Father   . Hypertension Father   . Obesity Father   . Hypertension Sister   . Hyperlipidemia Sister   . Hyperlipidemia Sister   . Breast cancer Neg Hx     Social History   Socioeconomic History  . Marital status: Married    Spouse name: Not on file  . Number of children: 1  . Years of education: Not on file  . Highest education level: Not on file  Occupational History  . Not on file  Social Needs  . Financial resource strain: Not hard at all  . Food insecurity    Worry: Never true    Inability: Never true  . Transportation needs    Medical: No    Non-medical: No  Tobacco Use  . Smoking status: Never Smoker  . Smokeless tobacco: Never Used  Substance and Sexual Activity  . Alcohol use: No    Alcohol/week: 0.0 standard drinks  . Drug use: No  . Sexual activity: Yes    Partners: Male  Lifestyle  . Physical activity    Days per week: 3 days    Minutes per session: 30 min  . Stress: Not at all  Relationships  . Social connections    Talks on phone: More than three times a week    Gets together: More than three times a week    Attends religious service: More than 4 times per year    Active member of club or organization: No    Attends meetings of clubs or organizations: Never    Relationship status: Married  . Intimate partner violence    Fear of current or ex partner: No    Emotionally abused: No    Physically abused: No    Forced sexual activity: No  Other Topics Concern  . Not on file  Social History Narrative   Married, one adopted grown child      Current Outpatient Medications:  .  acetaminophen (TYLENOL) 650 MG CR tablet, Take 650-1,300 mg by mouth 2 (two) times daily as needed for pain., Disp: , Rfl:  .  atorvastatin (LIPITOR) 40 MG tablet, Take 1 tablet (40 mg total) by mouth daily at 6 PM., Disp: 90 tablet, Rfl: 1 .  Calcium Carbonate-Vitamin D  (CALCIUM 600-D) 600-400 MG-UNIT per tablet, Take 1 tablet by mouth 2 (two) times daily., Disp: , Rfl:  .  celecoxib (CELEBREX) 200 MG capsule, Take 200 mg by mouth daily as needed for moderate pain. , Disp: , Rfl: 11 .  doxycycline (VIBRA-TABS) 100 MG tablet, Take 100 mg by mouth 2 (two) times daily., Disp: , Rfl:  .  fluticasone (FLONASE) 50 MCG/ACT nasal spray, Place 2 sprays into both nostrils daily. (Patient taking differently: Place 2 sprays into both nostrils daily as needed for allergies. ), Disp: 16 g, Rfl: 2 .  gabapentin (NEURONTIN) 300 MG capsule, Take 1 capsule (300 mg total) by mouth every evening., Disp: 90 capsule, Rfl:  2 .  glucose blood (ONE TOUCH ULTRA TEST) test strip, Use as instructed, Disp: 100 each, Rfl: 12 .  imipramine (TOFRANIL) 25 MG tablet, Take 1 tablet (25 mg total) by mouth at bedtime., Disp: 90 tablet, Rfl: 3 .  ketoconazole (NIZORAL) 2 % cream, Apply 1 application topically 2 (two) times daily as needed (rash). , Disp: , Rfl:  .  Magnesium Oxide 500 MG CAPS, Take 500 mg by mouth daily. , Disp: , Rfl:  .  midodrine (PROAMATINE) 5 MG tablet, Take 1 tablet (5 mg total) by mouth 3 (three) times daily with meals., Disp: 90 tablet, Rfl: 0 .  mirabegron ER (MYRBETRIQ) 25 MG TB24 tablet, Take 1 tablet (25 mg total) by mouth daily., Disp: 90 tablet, Rfl: 3 .  Netarsudil-Latanoprost (ROCKLATAN) 0.02-0.005 % SOLN, Place 1 drop into both eyes at bedtime., Disp: , Rfl:  .  traZODone (DESYREL) 50 MG tablet, TAKE 1 TABLET BY MOUTH AT BEDTIME AS NEEDED FOR SLEEP (Patient taking differently: Take 50 mg by mouth at bedtime as needed for sleep. ), Disp: 90 tablet, Rfl: 1  Allergies  Allergen Reactions  . Lisinopril Swelling    I personally reviewed active problem list, medication list, allergies, family history, social history, health maintenance with the patient/caregiver today.   ROS  Constitutional: Negative for fever or weight change.  Respiratory: Negative for cough ,  positive for mild  shortness of breath intermittently since went to [facility].   Cardiovascular: Negative for chest pain , positive for  Palpitations since she left [facility] - Dr. Clayborn Bigness is aware and she has a holter monitor.  Gastrointestinal: Negative for abdominal pain, no bowel changes.  Musculoskeletal: Positive  for gait problem and  joint swelling.  Skin: Negative for rash.  Neurological: Negative for dizziness or headache.  No other specific complaints in a complete review of systems (except as listed in HPI above).  Objective  Vitals:   08/23/19 1310  BP: 110/66  Pulse: 95  Resp: 16  Temp: (!) 96.9 F (36.1 C)  TempSrc: Temporal  SpO2: 97%  Weight: 250 lb 9.6 oz (113.7 kg)  Height: 5\' 11"  (1.803 m)    Body mass index is 34.95 kg/m.  Physical Exam  Constitutional: Patient appears well-developed and well-nourished. Obese No distress.  HEENT: head atraumatic, normocephalic, pupils equal and reactive to light Cardiovascular: Normal rate, regular rhythm and normal heart sounds.  No murmur heard. Trace  BLE edema. Pulmonary/Chest: Effort normal and breath sounds normal. No respiratory distress. Abdominal: Soft.  There is no tenderness. Muscular skeletal: tender area of swelling on left buttocks, mild bruise  Psychiatric: Patient has a normal mood and affect. behavior is normal. Judgment and thought content normal.  Recent Results (from the past 2160 hour(s))  Bladder Scan (Post Void Residual) in office     Status: None   Collection Time: 06/07/19 10:26 AM  Result Value Ref Range   Scan Result 0   CBC with Differential     Status: Abnormal   Collection Time: 08/07/19  9:52 AM  Result Value Ref Range   WBC 6.7 4.0 - 10.5 K/uL   RBC 3.08 (L) 3.87 - 5.11 MIL/uL   Hemoglobin 9.5 (L) 12.0 - 15.0 g/dL   HCT 29.9 (L) 36.0 - 46.0 %   MCV 97.1 80.0 - 100.0 fL   MCH 30.8 26.0 - 34.0 pg   MCHC 31.8 30.0 - 36.0 g/dL   RDW 16.0 (H) 11.5 - 15.5 %   Platelets 188 150 -  400 K/uL   nRBC  0.0 0.0 - 0.2 %   Neutrophils Relative % 73 %   Neutro Abs 4.8 1.7 - 7.7 K/uL   Lymphocytes Relative 18 %   Lymphs Abs 1.2 0.7 - 4.0 K/uL   Monocytes Relative 6 %   Monocytes Absolute 0.4 0.1 - 1.0 K/uL   Eosinophils Relative 3 %   Eosinophils Absolute 0.2 0.0 - 0.5 K/uL   Basophils Relative 0 %   Basophils Absolute 0.0 0.0 - 0.1 K/uL   Immature Granulocytes 0 %   Abs Immature Granulocytes 0.02 0.00 - 0.07 K/uL    Comment: Performed at [facility], [address] [place], [place] [address]  Comprehensive metabolic panel     Status: Abnormal   Collection Time: 08/07/19  9:52 AM  Result Value Ref Range   Sodium 140 135 - 145 mmol/L   Potassium 4.4 3.5 - 5.1 mmol/L   Chloride 106 98 - 111 mmol/L   CO2 25 22 - 32 mmol/L   Glucose, Bld 113 (H) 70 - 99 mg/dL   BUN 27 (H) 8 - 23 mg/dL   Creatinine, Ser 1.43 (H) 0.44 - 1.00 mg/dL   Calcium 8.6 (L) 8.9 - 10.3 mg/dL   Total Protein 6.8 6.5 - 8.1 g/dL   Albumin 3.4 (L) 3.5 - 5.0 g/dL   AST 22 15 - 41 U/L   ALT 16 0 - 44 U/L   Alkaline Phosphatase 97 38 - 126 U/L   Total Bilirubin 0.7 0.3 - 1.2 mg/dL   GFR calc non Af Amer 36 (L) >60 mL/min   GFR calc Af Amer 42 (L) >60 mL/min   Anion gap 9 5 - 15    Comment: Performed at [facility], [address], [place], [place] [address]  Troponin I (High Sensitivity)     Status: None   Collection Time: 08/07/19  9:52 AM  Result Value Ref Range   Troponin I (High Sensitivity) 5 <18 ng/L    Comment: (NOTE) Elevated high sensitivity troponin I (hsTnI) values and significant  changes across serial measurements may suggest ACS but many other  chronic and acute conditions are known to elevate hsTnI results.  Refer to the "Links" section for chest pain algorithms and additional  guidance. Performed at [facility], [address] [place], [place] [address]   Lactic acid, plasma     Status: Abnormal   Collection Time: 08/07/19 11:10 AM  Result Value Ref Range    Lactic Acid, Venous 2.1 (HH) 0.5 - 1.9 mmol/L    Comment: CRITICAL RESULT CALLED TO, READ BACK BY AND VERIFIED WITH SANDRA WEAVER ON 08/07/2019 AT 1145 TIK Performed at [facility], [address] [place], [place] [address]   SARS CORONAVIRUS 2 (TAT 6-24 HRS) Nasopharyngeal Nasopharyngeal Swab     Status: None   Collection Time: 08/07/19 11:10 AM   Specimen: Nasopharyngeal Swab  Result Value Ref Range   SARS Coronavirus 2 NEGATIVE NEGATIVE    Comment: (NOTE) SARS-CoV-2 target nucleic acids are NOT DETECTED. The SARS-CoV-2 RNA is generally detectable in upper and lower respiratory specimens during the acute phase of infection. Negative results do not preclude SARS-CoV-2 infection, do not rule out co-infections with other pathogens, and should not be used as the sole basis for treatment or other patient management decisions. Negative results must be combined with clinical observations, patient history, and epidemiological information. The expected result is Negative. Fact Sheet for Patients: SugarRoll.be Fact Sheet for  Healthcare Providers: https://www.woods-mathews.com/ This test is not yet approved or cleared by the [place] and  has been authorized for detection and/or diagnosis of SARS-CoV-2 by FDA under an Emergency Use Authorization (EUA). This EUA will remain  in effect (meaning this test can be used) for the duration of the COVID-19 declaration under Section 56 4(b)(1) of the Act, 21 U.S.C. section 360bbb-3(b)(1), unless the authorization is terminated or revoked sooner. Performed at [facility], [place] [address], [place], [place] [address]   Type and screen [place]     Status: None   Collection Time: 08/07/19 11:56 AM  Result Value Ref Range   ABO/RH(D) A POS    Antibody Screen NEG    Sample Expiration 08/10/2019,2359    Antibody Identification      NON SPECIFIC ANTIBODY  REACTIVITY Performed at [facility], [address] [place], [place] [address]   Troponin I (High Sensitivity)     Status: None   Collection Time: 08/07/19 11:56 AM  Result Value Ref Range   Troponin I (High Sensitivity) 4 <18 ng/L    Comment: (NOTE) Elevated high sensitivity troponin I (hsTnI) values and significant  changes across serial measurements may suggest ACS but many other  chronic and acute conditions are known to elevate hsTnI results.  Refer to the "Links" section for chest pain algorithms and additional  guidance. Performed at [facility], [address] [place], [place] [address]   TSH     Status: None   Collection Time: 08/07/19 12:02 PM  Result Value Ref Range   TSH 2.837 0.350 - 4.500 uIU/mL    Comment: Performed by a 3rd Generation assay with a functional sensitivity of <=0.01 uIU/mL. Performed at [facility], [address], [place], [place] [address]   Magnesium     Status: None   Collection Time: 08/07/19  5:09 PM  Result Value Ref Range   Magnesium 2.4 1.7 - 2.4 mg/dL    Comment: Performed at [facility], [address] [place], [place] [address]  Phosphorus     Status: None   Collection Time: 08/07/19  5:09 PM  Result Value Ref Range   Phosphorus 3.5 2.5 - 4.6 mg/dL    Comment: Performed at [facility], [address] [place], [place] [address]  Hemoglobin     Status: Abnormal   Collection Time: 08/07/19  5:09 PM  Result Value Ref Range   Hemoglobin 8.8 (L) 12.0 - 15.0 g/dL    Comment: Performed at [facility], [address] [place], [place] [address]  Vitamin B12     Status: None   Collection Time: 08/07/19  5:09 PM  Result Value Ref Range   Vitamin B-12 444 180 - 914 pg/mL    Comment: (NOTE) This assay is not validated for testing neonatal or myeloproliferative syndrome specimens for Vitamin B12 levels. Performed at [facility], [place] [address], [place],  [place] [address]   Folate     Status: None   Collection Time: 08/07/19  5:09 PM  Result Value Ref Range   Folate 13.1 >5.9 ng/mL    Comment: Performed at [facility], [address] [place], [place] [address]  Iron and TIBC     Status: Abnormal   Collection Time: 08/07/19  5:09 PM  Result Value Ref Range   Iron 36 28 - 170 ug/dL   TIBC 227 (L) 250 - 450 ug/dL  Saturation Ratios 16 10.4 - 31.8 %   UIBC 191 ug/dL    Comment: Performed at [facility], [address] [place], [place] [address]  Ferritin     Status: None   Collection Time: 08/07/19  5:09 PM  Result Value Ref Range   Ferritin 66 11 - 307 ng/mL    Comment: Performed at [facility], [address] [place], [place] [address]  Reticulocytes     Status: Abnormal   Collection Time: 08/07/19  5:09 PM  Result Value Ref Range   Retic Ct Pct 2.1 0.4 - 3.1 %   RBC. 2.88 (L) 3.87 - 5.11 MIL/uL   Retic Count, Absolute 61.1 19.0 - 186.0 K/uL   Immature Retic Fract 29.7 (H) 2.3 - 15.9 %    Comment: Performed at [facility], [address] [place], [place] [address]  T4, free     Status: None   Collection Time: 08/07/19  5:09 PM  Result Value Ref Range   Free T4 0.75 0.61 - 1.12 ng/dL    Comment: (NOTE) Biotin ingestion may interfere with free T4 tests. If the results are inconsistent with the TSH level, previous test results, or the clinical presentation, then consider biotin interference. If needed, order repeat testing after stopping biotin. Performed at [facility], [address] [place], [place] [address]   Hemoglobin     Status: Abnormal   Collection Time: 08/08/19 12:21 AM  Result Value Ref Range   Hemoglobin 8.3 (L) 12.0 - 15.0 g/dL    Comment: Performed at [facility], [address] [place], [place] [address]  Ethanol     Status: None   Collection Time: 08/08/19 12:21 AM  Result Value Ref Range   Alcohol, Ethyl (B) <10 <10 mg/dL    Comment:  (NOTE) Lowest detectable limit for serum alcohol is 10 mg/dL. For medical purposes only. Performed at [facility], [address] [place], [place] [address]   Hemoglobin     Status: Abnormal   Collection Time: 08/08/19  7:46 AM  Result Value Ref Range   Hemoglobin 8.6 (L) 12.0 - 15.0 g/dL    Comment: Performed at [facility], [address] [place], [place] [address]  Glucose, capillary     Status: Abnormal   Collection Time: 08/08/19  8:06 AM  Result Value Ref Range   Glucose-Capillary 66 (L) 70 - 99 mg/dL  ECHOCARDIOGRAM COMPLETE     Status: None   Collection Time: 08/08/19 11:45 AM  Result Value Ref Range   Weight 4,177.6 oz   Height 71 in   BP 111/52 mmHg  CBC     Status: Abnormal   Collection Time: 08/09/19  5:28 AM  Result Value Ref Range   WBC 5.5 4.0 - 10.5 K/uL   RBC 2.56 (L) 3.87 - 5.11 MIL/uL   Hemoglobin 7.8 (L) 12.0 - 15.0 g/dL   HCT 25.1 (L) 36.0 - 46.0 %   MCV 98.0 80.0 - 100.0 fL   MCH 30.5 26.0 - 34.0 pg   MCHC 31.1 30.0 - 36.0 g/dL   RDW 16.1 (H) 11.5 - 15.5 %   Platelets 175 150 - 400 K/uL   nRBC 0.4 (H) 0.0 - 0.2 %    Comment: Performed at [facility], [address], [place], [place] [id number]  Basic metabolic panel     Status: Abnormal   Collection Time: 08/09/19  5:28 AM  Result Value Ref Range  Sodium 139 135 - 145 mmol/L   Potassium 4.4 3.5 - 5.1 mmol/L   Chloride 108 98 - 111 mmol/L   CO2 27 22 - 32 mmol/L   Glucose, Bld 98 70 - 99 mg/dL   BUN 14 8 - 23 mg/dL   Creatinine, Ser 1.01 (H) 0.44 - 1.00 mg/dL   Calcium 8.4 (L) 8.9 - 10.3 mg/dL   GFR calc non Af Amer 55 (L) >60 mL/min   GFR calc Af Amer >60 >60 mL/min   Anion gap 4 (L) 5 - 15    Comment: Performed at [facility], [address] [place], [place] [address]  Glucose, capillary     Status: None   Collection Time: 08/09/19  8:08 AM  Result Value Ref Range   Glucose-Capillary 86 70 - 99 mg/dL  Hemoglobin and hematocrit, blood      Status: Abnormal   Collection Time: 08/09/19 10:58 AM  Result Value Ref Range   Hemoglobin 8.1 (L) 12.0 - 15.0 g/dL   HCT 26.0 (L) 36.0 - 46.0 %    Comment: Performed at [facility], [address], [place], [place] [address]  Lactic acid, plasma     Status: None   Collection Time: 08/09/19 10:58 AM  Result Value Ref Range   Lactic Acid, Venous 1.4 0.5 - 1.9 mmol/L    Comment: Performed at [facility], [address] [place], [place] [address]     PHQ2/9: Depression screen [facility] 2/9 08/23/2019 05/12/2019 11/25/2018 07/27/2018 03/25/2018  Decreased Interest 0 0 0 0 0  Down, Depressed, Hopeless 0 0 0 0 0  PHQ - 2 Score 0 0 0 0 0  Altered sleeping 0 0 0 0 0  Tired, decreased energy 0 0 0 0 0  Change in appetite 0 0 0 1 0  Feeling bad or failure about yourself  0 0 0 0 0  Trouble concentrating 0 0 0 1 0  Moving slowly or fidgety/restless 0 0 0 0 0  Suicidal thoughts 0 0 - 0 0  PHQ-9 Score 0 0 0 2 0  Difficult doing work/chores - Not difficult at all - Not difficult at all Not difficult at all    phq 9 is negative   Fall Risk: Fall Risk  08/23/2019 05/12/2019 11/25/2018 07/27/2018 03/25/2018  Falls in the past year? 0 0 1 0 No  Number falls in past yr: 0 0 1 0 -  Comment - - - - -  Injury with Fall? 0 0 0 0 -  Follow up - Falls evaluation completed - - -     Functional Status Survey: Is the patient deaf or have difficulty hearing?: No Does the patient have difficulty seeing, even when wearing glasses/contacts?: No Does the patient have difficulty concentrating, remembering, or making decisions?: No Does the patient have difficulty walking or climbing stairs?: No Does the patient have difficulty dressing or bathing?: No Does the patient have difficulty doing errands alone such as visiting a doctor's office or shopping?: No    Assessment & Plan   1. Hospital discharge follow-up  - COMPLETE METABOLIC PANEL WITH GFR  2. Thrombophlebitis of saphenous  vein, left  Stop Xarelto, took for over 3 months no need to continue it   3. Iron deficiency anemia, unspecified iron deficiency anemia type  - CBC with Differential/Platelet - Iron, TIBC and Ferritin Panel  4. Type 2 diabetes mellitus with diabetic neuropathy, without long-term current use of insulin (HCC)  -  POCT HgB A1C  5. Benign essential HTN  - COMPLETE METABOLIC PANEL WITH GFR

## 2019-08-24 LAB — IRON,TIBC AND FERRITIN PANEL
%SAT: 22 % (calc) (ref 16–45)
Ferritin: 163 ng/mL (ref 16–288)
Iron: 55 ug/dL (ref 45–160)
TIBC: 255 mcg/dL (calc) (ref 250–450)

## 2019-08-24 LAB — CBC WITH DIFFERENTIAL/PLATELET
Absolute Monocytes: 580 cells/uL (ref 200–950)
Basophils Absolute: 41 cells/uL (ref 0–200)
Basophils Relative: 0.6 %
Eosinophils Absolute: 242 cells/uL (ref 15–500)
Eosinophils Relative: 3.5 %
HCT: 32.4 % — ABNORMAL LOW (ref 35.0–45.0)
Hemoglobin: 10.5 g/dL — ABNORMAL LOW (ref 11.7–15.5)
Lymphs Abs: 1152 cells/uL (ref 850–3900)
MCH: 30.3 pg (ref 27.0–33.0)
MCHC: 32.4 g/dL (ref 32.0–36.0)
MCV: 93.6 fL (ref 80.0–100.0)
MPV: 10.2 fL (ref 7.5–12.5)
Monocytes Relative: 8.4 %
Neutro Abs: 4885 cells/uL (ref 1500–7800)
Neutrophils Relative %: 70.8 %
Platelets: 161 10*3/uL (ref 140–400)
RBC: 3.46 10*6/uL — ABNORMAL LOW (ref 3.80–5.10)
RDW: 15.8 % — ABNORMAL HIGH (ref 11.0–15.0)
Total Lymphocyte: 16.7 %
WBC: 6.9 10*3/uL (ref 3.8–10.8)

## 2019-08-24 LAB — COMPLETE METABOLIC PANEL WITH GFR
AG Ratio: 1.1 (calc) (ref 1.0–2.5)
ALT: 12 U/L (ref 6–29)
AST: 21 U/L (ref 10–35)
Albumin: 3.9 g/dL (ref 3.6–5.1)
Alkaline phosphatase (APISO): 128 U/L (ref 37–153)
BUN/Creatinine Ratio: 13 (calc) (ref 6–22)
BUN: 15 mg/dL (ref 7–25)
CO2: 27 mmol/L (ref 20–32)
Calcium: 9.5 mg/dL (ref 8.6–10.4)
Chloride: 104 mmol/L (ref 98–110)
Creat: 1.14 mg/dL — ABNORMAL HIGH (ref 0.60–0.93)
GFR, Est African American: 55 mL/min/{1.73_m2} — ABNORMAL LOW (ref >=60)
GFR, Est Non African American: 48 mL/min/{1.73_m2} — ABNORMAL LOW (ref >=60)
Globulin: 3.5 g/dL (calc) (ref 1.9–3.7)
Glucose, Bld: 92 mg/dL (ref 65–99)
Potassium: 3.9 mmol/L (ref 3.5–5.3)
Sodium: 141 mmol/L (ref 135–146)
Total Bilirubin: 0.8 mg/dL (ref 0.2–1.2)
Total Protein: 7.4 g/dL (ref 6.1–8.1)

## 2019-08-26 DIAGNOSIS — S7002XA Contusion of left hip, initial encounter: Secondary | ICD-10-CM | POA: Diagnosis not present

## 2019-08-26 DIAGNOSIS — M25552 Pain in left hip: Secondary | ICD-10-CM | POA: Diagnosis not present

## 2019-08-29 ENCOUNTER — Other Ambulatory Visit: Payer: Self-pay | Admitting: Podiatry

## 2019-08-29 DIAGNOSIS — S82891A Other fracture of right lower leg, initial encounter for closed fracture: Secondary | ICD-10-CM | POA: Diagnosis not present

## 2019-08-29 DIAGNOSIS — M25571 Pain in right ankle and joints of right foot: Secondary | ICD-10-CM | POA: Diagnosis not present

## 2019-08-30 ENCOUNTER — Ambulatory Visit: Payer: Self-pay | Admitting: Pharmacist

## 2019-08-30 ENCOUNTER — Encounter
Admission: RE | Admit: 2019-08-30 | Discharge: 2019-08-30 | Disposition: A | Discharge status: Home / Self Care | Payer: BC Managed Care – PPO | Source: Ambulatory Visit | Attending: Podiatry | Admitting: Podiatry

## 2019-08-30 ENCOUNTER — Other Ambulatory Visit: Payer: Self-pay

## 2019-08-30 DIAGNOSIS — Z20828 Contact with and (suspected) exposure to other viral communicable diseases: Secondary | ICD-10-CM | POA: Diagnosis not present

## 2019-08-30 DIAGNOSIS — Z01812 Encounter for preprocedural laboratory examination: Secondary | ICD-10-CM | POA: Diagnosis not present

## 2019-08-30 HISTORY — DX: Syncope and collapse: R55

## 2019-08-30 NOTE — Patient Instructions (Addendum)
Your procedure is scheduled on: 09/02/2019 Fri Report to Same Day Surgery 2nd floor medical mall [facility] Entrance-take elevator on left to 2nd floor.  Check in with surgery information desk.) To find out your arrival time please call [phone] between 1PM - 3PM on 09/01/2019 Thurs  Remember: Instructions that are not followed completely may result in serious medical risk, up to and including death, or upon the discretion of your surgeon and anesthesiologist your surgery may need to be rescheduled.    _x___ 1. Do not eat food after midnight the night before your procedure. You may drink clear liquids up to 2 hours before you are scheduled to arrive at the hospital for your procedure.  Do not drink clear liquids within 2 hours of your scheduled arrival to the hospital.  Clear liquids include  --Water or Apple juice without pulp  --Clear carbohydrate beverage such as ClearFast or Gatorade  --Black Coffee or Clear Tea (No milk, no creamers, do not add anything to                  the coffee or Tea Type 1 and type 2 diabetics should only drink water.   ____Ensure clear carbohydrate drink on the way to the hospital for bariatric patients  __x__Ensure clear carbohydrate drink 3 hours before surgery. Complete drink 2 hours before coming to the hospital.   No gum chewing or hard candies.     __x__ 2. No Alcohol for 24 hours before or after surgery.   __x__3. No Smoking or e-cigarettes for 24 prior to surgery.  Do not use any chewable tobacco products for at least 6 hour prior to surgery   ____  4. Bring all medications with you on the day of surgery if instructed.    __x__ 5. Notify your doctor if there is any change in your medical condition     (cold, fever, infections).    x___6. On the morning of surgery brush your teeth with toothpaste and water.  You may rinse your mouth with mouth wash if you wish.  Do not swallow any toothpaste or mouthwash.   Do not wear jewelry, make-up,  hairpins, clips or nail polish.  Do not wear lotions, powders, or perfumes. You may wear deodorant.  Do not shave 48 hours prior to surgery. Men may shave face and neck.  Do not bring valuables to the hospital.    [facility] is not responsible for any belongings or valuables.               Contacts, dentures or bridgework may not be worn into surgery.  Leave your suitcase in the car. After surgery it may be brought to your room.  For patients admitted to the hospital, discharge time is determined by your                       treatment team.  _  Patients discharged the day of surgery will not be allowed to drive home.  You will need someone to drive you home and stay with you the night of your procedure.    Please read over the following fact sheets that you were given:   [facility] Preparing for Surgery and or MRSA Information   _x___ Take anti-hypertensive listed below, cardiac, seizure, asthma,     anti-reflux and psychiatric medicines. These include:  1. midodrine (PROAMATINE) 5 MG tablet  2.mirabegron ER (MYRBETRIQ) 25 MG TB24 tablet  3.  4.  5.  6.  ____Fleets enema or Magnesium Citrate as directed.   _x___ Use CHG Soap or sage wipes as directed on instruction sheet   ____ Use inhalers on the day of surgery and bring to hospital day of surgery  ____ Stop Metformin and Janumet 2 days prior to surgery.    ____ Take 1/2 of usual insulin dose the night before surgery and none on the morning     surgery.   _x___ Follow recommendations from Cardiologist, Pulmonologist or PCP regarding          stopping Aspirin, Coumadin, Plavix ,Eliquis, Effient, or Pradaxa, and Pletal.  X____Stop Anti-inflammatories such as Advil, Aleve, Ibuprofen, Motrin, Naproxen, Naprosyn, Goodies powders or aspirin products. OK to take Tylenol and                          Celebrex.   _x___ Stop supplements until after surgery.  But may continue Vitamin D, Vitamin B,       and multivitamin.   ____ Bring  C-Pap to the hospital.

## 2019-08-30 NOTE — Chronic Care Management (AMB) (Signed)
  Chronic Care Management   Note  08/30/2019 Name: DENNYS FRISINGER MRN: [id number] DOB: 22-Sep-1945  73 y.o. year old female referred to Chronic Care Management by EMMI Red Flag. RNCM Janci Minor spoke with patient 08/15/19 (see note). Today's call was a follow up to [employer]. Of note, patient was contacted by care guide Hillery Jacks for CCM services 07/21/19 and patient declined.   Was unable to reach patient via telephone today and have left HIPAA compliant voicemail asking patient to return my call. (unsuccessful outreach #1).  Follow up plan: A HIPPA compliant phone message was left for the patient providing contact information and requesting a return call.  The care management team will reach out to the patient again over the next 5-10 days.   Ruben Reason, PharmD Clinical Pharmacist [facility] [phone]

## 2019-08-30 NOTE — Pre-Procedure Instructions (Signed)
Called Dr Andree Elk regarding medical history.  Cardiac clearance requested.  Dr Deborra Medina office and Dr Etta Quill office notified of need for clearance.

## 2019-08-30 NOTE — Pre-Procedure Instructions (Signed)
Incentive spirometry and Gatorade drink given along with instructions.

## 2019-08-31 ENCOUNTER — Other Ambulatory Visit: Payer: BC Managed Care – PPO

## 2019-08-31 LAB — SARS CORONAVIRUS 2 (TAT 6-24 HRS): SARS Coronavirus 2: NEGATIVE

## 2019-08-31 NOTE — Pre-Procedure Instructions (Signed)
Called for cardiac clearance, "the nurse is faxing clearance in a shortly." per office staff.

## 2019-09-02 ENCOUNTER — Ambulatory Visit: Payer: BC Managed Care – PPO

## 2019-09-02 ENCOUNTER — Encounter: Payer: Self-pay | Admitting: Podiatry

## 2019-09-02 ENCOUNTER — Ambulatory Visit: Payer: BC Managed Care – PPO | Admitting: Anesthesiology

## 2019-09-02 ENCOUNTER — Encounter
Admission: RE | Disposition: A | Discharge status: Home / Self Care | Payer: Self-pay | Source: Home / Self Care | Attending: Podiatry

## 2019-09-02 ENCOUNTER — Ambulatory Visit
Admission: RE | Admit: 2019-09-02 | Discharge: 2019-09-02 | Disposition: A | Discharge status: Home / Self Care | Payer: BC Managed Care – PPO | Attending: Podiatry | Admitting: Podiatry

## 2019-09-02 DIAGNOSIS — S82891A Other fracture of right lower leg, initial encounter for closed fracture: Secondary | ICD-10-CM | POA: Diagnosis not present

## 2019-09-02 DIAGNOSIS — G709 Myoneural disorder, unspecified: Secondary | ICD-10-CM | POA: Insufficient documentation

## 2019-09-02 DIAGNOSIS — Z419 Encounter for procedure for purposes other than remedying health state, unspecified: Secondary | ICD-10-CM

## 2019-09-02 DIAGNOSIS — G4730 Sleep apnea, unspecified: Secondary | ICD-10-CM | POA: Diagnosis not present

## 2019-09-02 DIAGNOSIS — E785 Hyperlipidemia, unspecified: Secondary | ICD-10-CM | POA: Insufficient documentation

## 2019-09-02 DIAGNOSIS — S82841A Displaced bimalleolar fracture of right lower leg, initial encounter for closed fracture: Secondary | ICD-10-CM | POA: Insufficient documentation

## 2019-09-02 DIAGNOSIS — M1990 Unspecified osteoarthritis, unspecified site: Secondary | ICD-10-CM | POA: Diagnosis not present

## 2019-09-02 DIAGNOSIS — W19XXXA Unspecified fall, initial encounter: Secondary | ICD-10-CM | POA: Insufficient documentation

## 2019-09-02 DIAGNOSIS — N183 Chronic kidney disease, stage 3 unspecified: Secondary | ICD-10-CM | POA: Diagnosis not present

## 2019-09-02 DIAGNOSIS — E1122 Type 2 diabetes mellitus with diabetic chronic kidney disease: Secondary | ICD-10-CM | POA: Diagnosis not present

## 2019-09-02 DIAGNOSIS — I129 Hypertensive chronic kidney disease with stage 1 through stage 4 chronic kidney disease, or unspecified chronic kidney disease: Secondary | ICD-10-CM | POA: Diagnosis not present

## 2019-09-02 DIAGNOSIS — S82831A Other fracture of upper and lower end of right fibula, initial encounter for closed fracture: Secondary | ICD-10-CM | POA: Diagnosis present

## 2019-09-02 DIAGNOSIS — I10 Essential (primary) hypertension: Secondary | ICD-10-CM | POA: Diagnosis not present

## 2019-09-02 DIAGNOSIS — E669 Obesity, unspecified: Secondary | ICD-10-CM | POA: Insufficient documentation

## 2019-09-02 DIAGNOSIS — D649 Anemia, unspecified: Secondary | ICD-10-CM | POA: Insufficient documentation

## 2019-09-02 DIAGNOSIS — E119 Type 2 diabetes mellitus without complications: Secondary | ICD-10-CM | POA: Diagnosis not present

## 2019-09-02 DIAGNOSIS — Z09 Encounter for follow-up examination after completed treatment for conditions other than malignant neoplasm: Secondary | ICD-10-CM

## 2019-09-02 DIAGNOSIS — S82831D Other fracture of upper and lower end of right fibula, subsequent encounter for closed fracture with routine healing: Secondary | ICD-10-CM | POA: Diagnosis not present

## 2019-09-02 DIAGNOSIS — Z79899 Other long term (current) drug therapy: Secondary | ICD-10-CM | POA: Insufficient documentation

## 2019-09-02 DIAGNOSIS — Z888 Allergy status to other drugs, medicaments and biological substances status: Secondary | ICD-10-CM | POA: Diagnosis not present

## 2019-09-02 DIAGNOSIS — G8918 Other acute postprocedural pain: Secondary | ICD-10-CM | POA: Diagnosis not present

## 2019-09-02 HISTORY — PX: ORIF ANKLE FRACTURE: SHX5408

## 2019-09-02 LAB — POCT I-STAT, CHEM 8
BUN: 14 mg/dL (ref 8–23)
Calcium, Ion: 1.17 mmol/L (ref 1.15–1.40)
Chloride: 106 mmol/L (ref 98–111)
Creatinine, Ser: 1 mg/dL (ref 0.44–1.00)
Glucose, Bld: 93 mg/dL (ref 70–99)
HCT: 34 % — ABNORMAL LOW (ref 36.0–46.0)
Hemoglobin: 11.6 g/dL — ABNORMAL LOW (ref 12.0–15.0)
Potassium: 3.8 mmol/L (ref 3.5–5.1)
Sodium: 139 mmol/L (ref 135–145)
TCO2: 25 mmol/L (ref 22–32)

## 2019-09-02 LAB — GLUCOSE, CAPILLARY: Glucose-Capillary: 125 mg/dL — ABNORMAL HIGH (ref 70–99)

## 2019-09-02 SURGERY — OPEN REDUCTION INTERNAL FIXATION (ORIF) ANKLE FRACTURE
Anesthesia: General | Site: Ankle | Laterality: Right

## 2019-09-02 MED ORDER — LABETALOL HCL 5 MG/ML IV SOLN
INTRAVENOUS | Status: AC
  Filled 2019-09-02: qty 4

## 2019-09-02 MED ORDER — PROPOFOL 10 MG/ML IV BOLUS
INTRAVENOUS | Status: AC
  Filled 2019-09-02: qty 40

## 2019-09-02 MED ORDER — FAMOTIDINE 20 MG PO TABS
ORAL_TABLET | ORAL | Status: AC
  Filled 2019-09-02: qty 1

## 2019-09-02 MED ORDER — LABETALOL HCL 5 MG/ML IV SOLN
INTRAVENOUS | Status: DC | PRN
  Administered 2019-09-02: 5 mg via INTRAVENOUS

## 2019-09-02 MED ORDER — SODIUM CHLORIDE 0.9 % IR SOLN
Status: DC | PRN
  Administered 2019-09-02: 1000 mL

## 2019-09-02 MED ORDER — MIDAZOLAM HCL 2 MG/2ML IJ SOLN
INTRAMUSCULAR | Status: AC
  Administered 2019-09-02: 1 mg via INTRAVENOUS
  Filled 2019-09-02: qty 2

## 2019-09-02 MED ORDER — ROPIVACAINE HCL 5 MG/ML IJ SOLN
INTRAMUSCULAR | Status: DC | PRN
  Administered 2019-09-02: 30 mL via PERINEURAL

## 2019-09-02 MED ORDER — ONDANSETRON HCL 4 MG PO TABS: 4.0000 mg | ORAL_TABLET | Freq: Three times a day (TID) | ORAL | 0 refills | Status: AC | PRN

## 2019-09-02 MED ORDER — CEFAZOLIN SODIUM-DEXTROSE 2-4 GM/100ML-% IV SOLN
INTRAVENOUS | Status: AC
  Filled 2019-09-02: qty 100

## 2019-09-02 MED ORDER — OXYCODONE-ACETAMINOPHEN 7.5-325 MG PO TABS
ORAL_TABLET | ORAL | Status: AC
  Filled 2019-09-02: qty 1

## 2019-09-02 MED ORDER — SODIUM CHLORIDE 0.9 % IV SOLN: INTRAVENOUS | Status: DC

## 2019-09-02 MED ORDER — OXYCODONE-ACETAMINOPHEN 7.5-325 MG PO TABS
1.0000 | ORAL_TABLET | Freq: Four times a day (QID) | ORAL | Status: DC | PRN
  Administered 2019-09-02: 1 via ORAL
  Filled 2019-09-02 (×2): qty 1

## 2019-09-02 MED ORDER — PROPOFOL 10 MG/ML IV BOLUS
INTRAVENOUS | Status: DC | PRN
  Administered 2019-09-02: 60 mg via INTRAVENOUS
  Administered 2019-09-02: 110 mg via INTRAVENOUS

## 2019-09-02 MED ORDER — FENTANYL CITRATE (PF) 100 MCG/2ML IJ SOLN
INTRAMUSCULAR | Status: AC
  Filled 2019-09-02: qty 2

## 2019-09-02 MED ORDER — MIDAZOLAM HCL 2 MG/2ML IJ SOLN: 1.0000 mg | Freq: Once | INTRAMUSCULAR | Status: AC

## 2019-09-02 MED ORDER — LIDOCAINE HCL (PF) 1 % IJ SOLN
INTRAMUSCULAR | Status: AC
  Filled 2019-09-02: qty 5

## 2019-09-02 MED ORDER — ONDANSETRON HCL 4 MG/2ML IJ SOLN
INTRAMUSCULAR | Status: DC | PRN
  Administered 2019-09-02: 4 mg via INTRAVENOUS

## 2019-09-02 MED ORDER — ROPIVACAINE HCL 5 MG/ML IJ SOLN
INTRAMUSCULAR | Status: AC
  Filled 2019-09-02: qty 30

## 2019-09-02 MED ORDER — ONDANSETRON HCL 4 MG/2ML IJ SOLN: 4.0000 mg | Freq: Once | INTRAMUSCULAR | Status: DC | PRN

## 2019-09-02 MED ORDER — POVIDONE-IODINE 7.5 % EX SOLN
Freq: Once | CUTANEOUS | Status: DC
  Filled 2019-09-02: qty 118

## 2019-09-02 MED ORDER — FENTANYL CITRATE (PF) 100 MCG/2ML IJ SOLN
25.0000 ug | INTRAMUSCULAR | Status: DC | PRN
  Administered 2019-09-02 (×2): 25 ug via INTRAVENOUS

## 2019-09-02 MED ORDER — ACETAMINOPHEN 10 MG/ML IV SOLN
INTRAVENOUS | Status: AC
  Filled 2019-09-02: qty 100

## 2019-09-02 MED ORDER — OXYCODONE-ACETAMINOPHEN 7.5-325 MG PO TABS: 1.0000 | ORAL_TABLET | Freq: Four times a day (QID) | ORAL | 0 refills | Status: AC | PRN

## 2019-09-02 MED ORDER — LACTATED RINGERS IV SOLN: INTRAVENOUS | Status: DC | PRN

## 2019-09-02 MED ORDER — ACETAMINOPHEN 10 MG/ML IV SOLN
INTRAVENOUS | Status: DC | PRN
  Administered 2019-09-02: 1000 mg via INTRAVENOUS

## 2019-09-02 MED ORDER — FAMOTIDINE 20 MG PO TABS
20.0000 mg | ORAL_TABLET | Freq: Once | ORAL | Status: AC
  Administered 2019-09-02: 20 mg via ORAL

## 2019-09-02 MED ORDER — FENTANYL CITRATE (PF) 100 MCG/2ML IJ SOLN
INTRAMUSCULAR | Status: DC | PRN
  Administered 2019-09-02 (×2): 25 ug via INTRAVENOUS
  Administered 2019-09-02 (×3): 50 ug via INTRAVENOUS

## 2019-09-02 MED ORDER — ENOXAPARIN SODIUM 40 MG/0.4ML ~~LOC~~ SOLN: 40.0000 mg | SUBCUTANEOUS | 0 refills | Status: DC

## 2019-09-02 MED ORDER — LIDOCAINE HCL (PF) 1 % IJ SOLN
INTRAMUSCULAR | Status: DC | PRN
  Administered 2019-09-02: 4 mL

## 2019-09-02 MED ORDER — FENTANYL CITRATE (PF) 100 MCG/2ML IJ SOLN
INTRAMUSCULAR | Status: AC
  Administered 2019-09-02: 25 ug via INTRAVENOUS
  Filled 2019-09-02: qty 2

## 2019-09-02 MED ORDER — CEFAZOLIN SODIUM-DEXTROSE 2-4 GM/100ML-% IV SOLN
2.0000 g | INTRAVENOUS | Status: AC
  Administered 2019-09-02: 2 g via INTRAVENOUS

## 2019-09-02 MED ORDER — LIDOCAINE HCL (CARDIAC) PF 100 MG/5ML IV SOSY
PREFILLED_SYRINGE | INTRAVENOUS | Status: DC | PRN
  Administered 2019-09-02: 60 mg via INTRAVENOUS

## 2019-09-02 MED ORDER — CEPHALEXIN 500 MG PO CAPS: 500.0000 mg | ORAL_CAPSULE | Freq: Four times a day (QID) | ORAL | 0 refills | Status: AC

## 2019-09-02 SURGICAL SUPPLY — 57 items
BIT DRILL 2.5X2.75 QC CALB (BIT) ×1 IMPLANT
BIT DRILL 3.5X5.5 QC CALB (BIT) ×1 IMPLANT
BIT DRILL CALIBRATED 2.7 (BIT) ×1 IMPLANT
BLADE SURG 15 STRL LF DISP TIS (BLADE) IMPLANT
BLADE SURG 15 STRL SS (BLADE)
BNDG COHESIVE 4X5 TAN STRL (GAUZE/BANDAGES/DRESSINGS) ×2 IMPLANT
BNDG CONFORM 2 STRL LF (GAUZE/BANDAGES/DRESSINGS) ×2 IMPLANT
BNDG CONFORM 3 STRL LF (GAUZE/BANDAGES/DRESSINGS) ×2 IMPLANT
BNDG ELASTIC 4X5.8 VLCR NS LF (GAUZE/BANDAGES/DRESSINGS) ×2 IMPLANT
BNDG ESMARK 4X12 TAN STRL LF (GAUZE/BANDAGES/DRESSINGS) ×2 IMPLANT
BNDG GAUZE 4.5X4.1 6PLY STRL (MISCELLANEOUS) ×2 IMPLANT
CANISTER SUCT 1200ML W/VALVE (MISCELLANEOUS) ×2 IMPLANT
COVER WAND RF STERILE (DRAPES) ×2 IMPLANT
CUFF TOURN SGL QUICK 18X4 (TOURNIQUET CUFF) IMPLANT
CUFF TOURN SGL QUICK 24 (TOURNIQUET CUFF)
CUFF TRNQT CYL 24X4X16.5-23 (TOURNIQUET CUFF) IMPLANT
DRAPE C-ARM XRAY 36X54 (DRAPES) ×2 IMPLANT
DRAPE C-ARMOR (DRAPES) ×2 IMPLANT
DURAPREP 26ML APPLICATOR (WOUND CARE) ×2 IMPLANT
ELECT REM PT RETURN 9FT ADLT (ELECTROSURGICAL) ×2
ELECTRODE REM PT RTRN 9FT ADLT (ELECTROSURGICAL) ×1 IMPLANT
GAUZE SPONGE 4X4 12PLY STRL (GAUZE/BANDAGES/DRESSINGS) ×2 IMPLANT
GAUZE XEROFORM 1X8 LF (GAUZE/BANDAGES/DRESSINGS) ×2 IMPLANT
GLOVE BIO SURGEON STRL SZ7.5 (GLOVE) ×2 IMPLANT
GLOVE INDICATOR 8.0 STRL GRN (GLOVE) ×2 IMPLANT
GOWN STRL REUS W/ TWL LRG LVL3 (GOWN DISPOSABLE) ×3 IMPLANT
GOWN STRL REUS W/TWL LRG LVL3 (GOWN DISPOSABLE) ×3
K-WIRE ACE 1.6X6 (WIRE) ×6
KIT TURNOVER KIT A (KITS) ×2 IMPLANT
KWIRE ACE 1.6X6 (WIRE) IMPLANT
LABEL OR SOLS (LABEL) ×2 IMPLANT
NEEDLE HYPO 22GX1.5 SAFETY (NEEDLE) ×2 IMPLANT
NS IRRIG 500ML POUR BTL (IV SOLUTION) ×2 IMPLANT
PACK EXTREMITY ARMC (MISCELLANEOUS) ×2 IMPLANT
PAD PREP 24X41 OB/GYN DISP (PERSONAL CARE ITEMS) ×2 IMPLANT
PLATE LOCK 3H 95 RT DIST FIB (Plate) IMPLANT
PLATE LOCK 8H 103 BILAT FIB (Plate) ×1 IMPLANT
SCREW CORTICAL 3.5MM 22MM (Screw) ×1 IMPLANT
SCREW LOCK CORT STAR 3.5X10 (Screw) ×3 IMPLANT
SCREW LOCK CORT STAR 3.5X12 (Screw) ×2 IMPLANT
SCREW LOCK CORT STAR 3.5X14 (Screw) ×1 IMPLANT
SCREW LP NON LOCK 3.5X10MM (Screw) ×1 IMPLANT
SPLINT CAST 1 STEP 4X30 (MISCELLANEOUS) ×1 IMPLANT
SPLINT FAST PLASTER 5X30 (CAST SUPPLIES) ×1
SPLINT PLASTER CAST FAST 5X30 (CAST SUPPLIES) ×1 IMPLANT
SPONGE LAP 18X18 RF (DISPOSABLE) ×2 IMPLANT
STAPLER SKIN PROX 35W (STAPLE) ×2 IMPLANT
STOCKINETTE M/LG 89821 (MISCELLANEOUS) ×2 IMPLANT
STRAP SAFETY 5IN WIDE (MISCELLANEOUS) ×2 IMPLANT
STRIP CLOSURE SKIN 1/2X4 (GAUZE/BANDAGES/DRESSINGS) IMPLANT
SUT VIC AB 2-0 CT1 27 (SUTURE) ×1
SUT VIC AB 2-0 CT1 TAPERPNT 27 (SUTURE) ×1 IMPLANT
SUT VIC AB 3-0 SH 27 (SUTURE) ×2
SUT VIC AB 3-0 SH 27X BRD (SUTURE) ×1 IMPLANT
SWABSTK COMLB BENZOIN TINCTURE (MISCELLANEOUS) IMPLANT
SYR 10ML LL (SYRINGE) ×2 IMPLANT
SYR 50ML LL SCALE MARK (SYRINGE) ×2 IMPLANT

## 2019-09-02 NOTE — Op Note (Signed)
PODIATRY / FOOT AND ANKLE SURGERY OPERATIVE REPORT    SURGEON: [name], DPM  PRE-OPERATIVE DIAGNOSIS:  1.  Right ankle bimalleolar ankle fracture  POST-OPERATIVE DIAGNOSIS: Same  PROCEDURE(S): 1. Right ankle fracture open reduction with internal fixation  HEMOSTASIS: Right thigh tourniquet  ANESTHESIA: general, preop saphenous and popliteal nerve block  ESTIMATED BLOOD LOSS: 40 cc  FINDING(S): 1.  Comminuted right distal fibular fracture with butterfly fragment posteriorly, displaced 2.  Avulsion fracture at the medial malleolus/deltoid injury  PATHOLOGY/SPECIMEN(S): None  INDICATIONS:   Becky Trevino is a 73 y.o. female who presents with an acute injury to the right ankle.  Patient suffered a fall about a week ago and was seen in clinic afterwards due to not being able to put weight on the foot without pain.  An x-ray revealed a fracture of the distal fibula that appeared to be oblique in nature and slightly comminuted as well as widening of the medial clear space due to an avulsion of the distal tip of the medial malleolus and the deltoid.  Discussed all treatment options with the patient both conservative and surgical attempts at correction including potential risks and complications of surgical intervention in detail.  At this time the patient has elected for surgery consisting of right ankle fracture open reduction with internal fixation.  Discussed with patient that it may not be necessary to open the medial malleolar portion of the injury due to the small amount of avulsion present but if there appears to be talar tilt or still increased widening of the medial clear space then it may require either syndesmotic fixation or deltoid repair.  Patient understands.  All questions answered..  DESCRIPTION: After obtaining full informed written consent, the patient was brought back to the operating room and placed supine upon the operating table.  The patient received IV  antibiotics prior to induction.  After obtaining adequate anesthesia, the patient was prepped and draped in the standard fashion.  An Esmarch bandage was used to exsanguinate the right lower extremity and pneumatic thigh tourniquet was inflated.  Attention was then directed to the lateral aspect of the right leg at the distal fibula level where under C-arm guidance the fracture was able to be visualized.  The fracture site was marked on the skin.  The incision was then marked under fluoroscopic guidance.  The incision was then made from the distal tip of the lateral malleolus to the distal shaft portion of the fibula.  The incision was deepened to the subcutaneous tissues utilizing sharp and blunt dissection care was taken to identify and retract all vital neurovascular structures and all venous contributories were cauterized as necessary.  Upon making an incision there was a large amount of hematoma present which was evacuated with use of rongeur and pickups.  A periosteal type of incision was made into the distal aspect of the lateral malleolus extending across the distal shaft of the fibula.  The periosteum was reflected anteriorly and posteriorly thereby exposing the distal fibula at the operative site as well as the lateral malleolus.  The periosteum was reflected at the level of the fracture site and removed.  The hematoma was then cleaned further with a rongeur as well as curette.  The surgical site was flushed with copious amounts normal sterile saline.  The fracture appeared to be shortened and the distal fibular fragment appeared to be moved in a posterior direction with some comminution present at the posterior spike of the fracture.  The reduction clamp  was then used to reduce the fracture fragments into near-anatomic position.  A small component of the posterior spike was resected and passed off in the operative site as it appeared to be in the soft tissues and not reducible.  This was reduced under  fluoroscopic guidance.  At this time using standard AO principles and techniques and overdrill for 3.5 mm fully threaded solid core Biomet Zimmer screw was then performed at the dorsal distal shaft of the fibula directed obliquely across the fracture fragment perpendicular to the fracture line and penetrated the first cortex.  The drill was removed and then a 2.5 mm drill was placed and drilled bicortically.  This time using standard AO principles and techniques a 3.5 mm x 22 fully threaded solid core Biomet Zimmer screw was placed from anterior to posterior.  C-arm imaging was utilized to verify correct position which appeared to be excellent.  The screw did not bite very much during this process and the fracture still appeared to be fairly unstable.  The screw was subsequently removed and the fracture was reduced under fluoroscopic guidance again using the reduction clamps.  The bone appeared to be fairly soft at this level so it was decided to abandon the lag screw technique and go straight to plate fixation.  At this time a straight Biomet Zimmer ankle locking plate was then placed over the lateral aspect of the fibula and lateral malleolus level.  The plate was then held with temporary fixation under fluoroscopic guidance.  2-3 screw holes were placed distal to the fracture site and 5-6 screw holes were placed proximal to the fracture site and the plate was bent and contoured to the size of the fibula.  At this time screw holes were then filled distally and proximally with 3.5 mm locking screws x6 and one 3.5 mm nonlocking screw using standard AO principles and techniques.  Care was taken to make sure that none of the screws were intra-articular.  All screws had excellent purchase and locked in the plate very well except for the nonlocking screw which appeared to see the excellently bicortically.  C-arm imaging was then utilized to verify correct position of plate and screw placement which appeared to be  excellent and the fibula appeared to be reduced anatomic position.  The temporary fixation for the plate was removed and passed off the operative site and the fibula appeared to be stable and out to length.  The fracture line appeared to line up very well overall with no gapping or displacement.  A cotton hook test was performed pulling the fibula laterally but the syndesmosis did not appear to open.  The foot was also supinated and externally rotated and there was no displacement of the syndesmotic area and the medial clear space appeared to be well reduced overall.  There still appeared to be a small fracture fragment at the medial malleolar level but appeared to be fairly well reduced overall.  The talus was also stressed and varus and valgus positions and noted to be stable overall with no talar tilt.  The ankle was then brought through range of motion and noted to have excellent range of motion overall.  The surgical site was flushed with copious amounts normal sterile saline.  The periosteal tissue was then reapproximated well coapted with 2-0 Vicryl.  The subcutaneous tissue was reapproximated well coapted with 3-0 Vicryl and the skin was then reapproximated well coapted with staples.  After the procedure a postoperative dressing was applied consisting of  Xeroform to the incision site followed by 4 x 4 gauze, ABD, Kerlix, web roll, posterior splint, Ace wrap.  The pneumatic thigh tourniquet was deflated and a prompt hyperemic response was noted all digits of the right foot.  The patient tolerated the procedure and anesthesia well was transferred to the recovery room vital signs stable.  The patient be discharged home the following written and oral postop instructions, keep surgical dressings clean, dry, and intact until postoperative appointment, remain nonweightbearing to the right lower extremity at all times, ice and elevate right lower extremity when at rest, take postoperative pain medication,  antibiotics, antinausea medicine, and blood thinner medications as prescribed, perform knee flexion extension exercises as well as calf massages daily.  COMPLICATIONS: None  CONDITION: Good, stable  [name], DPM

## 2019-09-02 NOTE — H&P (Signed)
HISTORY AND PHYSICAL INTERVAL NOTE:  09/02/2019  10:06 AM  Becky Trevino  has presented today for surgery, with the diagnosis of S82.891 CLOSED FRACTURE DISLOCATION RIGHT ANKLE.  The various methods of treatment have been discussed with the patient.  No guarantees were given.  After consideration of risks, benefits and other options for treatment, the patient has consented to surgery.  I have reviewed the patients' chart and labs.    PROCEDURE: RIGHT ANKLE BIMALLEOLAR ORIF  A history and physical examination was performed in my office.  The patient was reexamined.  There have been no changes to this history and physical examination.  [name], DPM

## 2019-09-02 NOTE — Anesthesia Post-op Follow-up Note (Signed)
Anesthesia QCDR form completed.        

## 2019-09-02 NOTE — Anesthesia Procedure Notes (Signed)
Anesthesia Regional Block: Popliteal block   Pre-Anesthetic Checklist: ,, timeout performed, Correct Patient, Correct Site, Correct Laterality, Correct Procedure, Correct Position, site marked, Risks and benefits discussed,  Surgical consent,  Pre-op evaluation,  At surgeon's request and post-op pain management  Laterality: Lower and Right  Prep: chloraprep       Needles:  Injection technique: Single-shot  Needle Type: Echogenic Stimulator Needle     Needle Length: 9cm  Needle Gauge: 20     Additional Needles:   Procedures:,,,, ultrasound used (permanent image in chart),,,,  Narrative:  Start time: 09/02/2019 10:08 AM End time: 09/02/2019 10:11 AM Injection made incrementally with aspirations every 5 mL.  Performed by: Personally  Anesthesiologist: Molli Barrows, MD  Additional Notes: Patient consented for risk and benefits of nerve block including but not limited to nerve damage, failed block, bleeding and infection.  Patient voiced understanding.  Functioning IV was confirmed and monitors were applied.  A echogenic needle was used. Sterile prep,hand hygiene and sterile gloves were used. Minimal sedation used for procedure.   No paresthesia endorsed by patient during the procedure.  Negative aspiration and negative test dose prior to incremental administration of local anesthetic. The patient tolerated the procedure well with no immediate complications.

## 2019-09-02 NOTE — Discharge Instructions (Signed)
[place] DR. TROXLER, DR. Vickki Muff, AND DR. [place]   1. Take your medication as prescribed.  Pain medication should be taken only as needed.  Take Percocet medication once every 6 hours as prescribed.  You can also take ibuprofen or Tylenol between doses.  If pain persists then you can take the pain medication once every 4 hours.  If it persist still he can try taking 2 tablets every 6 hours.  If it continues after that please call the office for instruction.  Pain is normal after an operation and a sign that your body is trying to heal the area.  2. Take antibiotic medication, antinausea medicine, and blood thinner medication as prescribed.  Do not start the blood thinning medication until 24 hours after the procedure today.  3. Keep the dressing clean, dry and intact.  Maintain nonweightbearing at all times to the right lower extremity for the next 6 to 8 weeks depending on radiographic healing.  4. Keep your foot elevated above the heart level for the first 48 hours.  5. Walking to the bathroom and brief periods of walking are acceptable, unless we have instructed you to be non-weight bearing.  6. Always wear your post-op shoe when walking.  Always use your crutches if you are to be non-weight bearing.  7. Do not take a shower. Baths are permissible as long as the foot is kept out of the water.   8. Every hour you are awake:  - Bend your knee 15 times. - Flex foot 15 times - Massage calf 15 times  9. Call [facility] [phone]) if any of the following problems occur: - You develop a temperature or fever. - The bandage becomes saturated with blood. - Medication does not stop your pain. - Injury of the foot occurs. - Any symptoms of infection including redness, odor, or red streaks running from wound.  AMBULATORY SURGERY  DISCHARGE INSTRUCTIONS   1) The drugs  that you were given will stay in your system until tomorrow so for the next 24 hours you should not:  A) Drive an automobile B) Make any legal decisions C) Drink any alcoholic beverage   2) You may resume regular meals tomorrow.  Today it is better to start with liquids and gradually work up to solid foods.  You may eat anything you prefer, but it is better to start with liquids, then soup and crackers, and gradually work up to solid foods.   3) Please notify your doctor immediately if you have any unusual bleeding, trouble breathing, redness and pain at the surgery site, drainage, fever, or pain not relieved by medication.    4) Additional Instructions:   Please contact your physician with any problems or Same Day Surgery at [phone], Monday through Friday 6 am to 4 pm, or [place] at [facility] number at [phone]. -

## 2019-09-02 NOTE — Anesthesia Preprocedure Evaluation (Signed)
Anesthesia Evaluation  Patient identified by MRN, date of birth, ID band Patient awake    Reviewed: Allergy & Precautions, H&P , NPO status , Patient's Chart, lab work & pertinent test results, reviewed documented beta blocker date and time   Airway Mallampati: II  TM Distance: >3 FB Neck ROM: full    Dental  (+) Teeth Intact   Pulmonary neg pulmonary ROS, sleep apnea ,    Pulmonary exam normal        Cardiovascular Exercise Tolerance: Good hypertension, On Medications negative cardio ROS Normal cardiovascular exam Rate:Normal     Neuro/Psych  Neuromuscular disease negative neurological ROS  negative psych ROS   GI/Hepatic negative GI ROS, Neg liver ROS,   Endo/Other  negative endocrine ROSdiabetes  Renal/GU Renal diseasenegative Renal ROS  negative genitourinary   Musculoskeletal   Abdominal   Peds  Hematology negative hematology ROS (+) Blood dyscrasia, anemia ,   Anesthesia Other Findings   Reproductive/Obstetrics negative OB ROS                             Anesthesia Physical Anesthesia Plan  ASA: III  Anesthesia Plan: General LMA   Post-op Pain Management:    Induction:   PONV Risk Score and Plan:   Airway Management Planned:   Additional Equipment:   Intra-op Plan:   Post-operative Plan:   Informed Consent: I have reviewed the patients History and Physical, chart, labs and discussed the procedure including the risks, benefits and alternatives for the proposed anesthesia with the patient or authorized representative who has indicated his/her understanding and acceptance.       Plan Discussed with: CRNA  Anesthesia Plan Comments:         Anesthesia Quick Evaluation

## 2019-09-02 NOTE — Transfer of Care (Signed)
Immediate Anesthesia Transfer of Care Note  Patient: Becky Trevino  Procedure(s) Performed: OPEN REDUCTION INTERNAL FIXATION (ORIF) ANKLE FRACTURE BIMALLEOLAR (Right Ankle)  Patient Location: PACU  Anesthesia Type:General  Level of Consciousness: awake, alert  and oriented  Airway & Oxygen Therapy: Patient Spontanous Breathing and Patient connected to face mask oxygen  Post-op Assessment: Report given to RN and Post -op Vital signs reviewed and stable  Post vital signs: Reviewed and stable  Last Vitals:  Vitals Value Taken Time  BP    Temp    Pulse    Resp    SpO2      Last Pain:  Vitals:   09/02/19 0805  TempSrc: Temporal  PainSc: 3          Complications: No apparent anesthesia complications

## 2019-09-02 NOTE — Anesthesia Procedure Notes (Signed)
Procedure Name: LMA Insertion Date/Time: 09/02/2019 10:29 AM Performed by: Chanetta Marshall, CRNA Pre-anesthesia Checklist: Patient identified, Emergency Drugs available, Suction available and Patient being monitored Patient Re-evaluated:Patient Re-evaluated prior to induction Oxygen Delivery Method: Circle system utilized Preoxygenation: Pre-oxygenation with 100% oxygen Induction Type: IV induction Ventilation: Mask ventilation without difficulty LMA: LMA inserted LMA Size: 4.0 Tube type: Oral Number of attempts: 1 Airway Equipment and Method: Stylet and Oral airway Placement Confirmation: breath sounds checked- equal and bilateral,  positive ETCO2 and CO2 detector Tube secured with: Tape Dental Injury: Teeth and Oropharynx as per pre-operative assessment

## 2019-09-06 ENCOUNTER — Ambulatory Visit: Payer: Self-pay | Admitting: Pharmacist

## 2019-09-07 DIAGNOSIS — S82891D Other fracture of right lower leg, subsequent encounter for closed fracture with routine healing: Secondary | ICD-10-CM | POA: Diagnosis not present

## 2019-09-07 NOTE — Chronic Care Management (AMB) (Signed)
  Chronic Care Management   Note  09/07/2019 Name: Becky Trevino MRN: [id number] DOB: 08-08-1946  Follow up outreach today to [name], patient of Dr. [name], following a red flag EMMI outreach by [facility] Janci Minor. Patient has declined CCM services.   Successful telephone outreach. HIPAA identifiers verified.  Patient has all of her medications. She has no needs at this time. She was wondering who would be prescribing her midodrine outpatient, Dr. Ancil Boozer or Dr. Clayborn Bigness. Requests call back.   Follow up plan: Follow up with provider re: prescribing midodrine  Ruben Reason, PharmD Clinical Pharmacist [facility] [phone]

## 2019-09-07 NOTE — Anesthesia Postprocedure Evaluation (Signed)
Anesthesia Post Note  Patient: Becky Trevino  Procedure(s) Performed: OPEN REDUCTION INTERNAL FIXATION (ORIF) ANKLE FRACTURE BIMALLEOLAR (Right Ankle)  Patient location during evaluation: PACU Anesthesia Type: General Level of consciousness: awake and alert Pain management: pain level controlled Vital Signs Assessment: post-procedure vital signs reviewed and stable Respiratory status: spontaneous breathing, nonlabored ventilation, respiratory function stable and patient connected to nasal cannula oxygen Cardiovascular status: blood pressure returned to baseline and stable Postop Assessment: no apparent nausea or vomiting Anesthetic complications: no     Last Vitals:  Vitals:   09/02/19 1511 09/02/19 1559  BP: (!) 162/81 (!) 164/80  Pulse: 87 92  Resp: 16 16  Temp: (!) 36.1 C   SpO2: 100% 98%    Last Pain:  Vitals:   09/05/19 0850  TempSrc:   PainSc: 3                  Molli Barrows

## 2019-09-12 ENCOUNTER — Ambulatory Visit: Payer: Self-pay | Admitting: Pharmacist

## 2019-09-13 NOTE — Chronic Care Management (AMB) (Signed)
  Care Management   Note  09/13/2019 Name: Becky Trevino MRN: [id number] DOB: May 17, 1946   Follow up with patient- Dr. Clayborn Bigness will be outpatient prescriber for midodrine.  Plan No further follow up required: Patient has declined CCM services  Ruben Reason, PharmD Clinical Pharmacist [facility] [phone]

## 2019-09-14 DIAGNOSIS — S82891A Other fracture of right lower leg, initial encounter for closed fracture: Secondary | ICD-10-CM | POA: Diagnosis not present

## 2019-09-21 DIAGNOSIS — S7002XA Contusion of left hip, initial encounter: Secondary | ICD-10-CM | POA: Diagnosis not present

## 2019-09-21 DIAGNOSIS — S82891D Other fracture of right lower leg, subsequent encounter for closed fracture with routine healing: Secondary | ICD-10-CM | POA: Diagnosis not present

## 2019-09-22 DIAGNOSIS — I480 Paroxysmal atrial fibrillation: Secondary | ICD-10-CM | POA: Diagnosis not present

## 2019-09-22 DIAGNOSIS — Z950 Presence of cardiac pacemaker: Secondary | ICD-10-CM | POA: Diagnosis not present

## 2019-09-22 DIAGNOSIS — R55 Syncope and collapse: Secondary | ICD-10-CM | POA: Diagnosis not present

## 2019-09-22 DIAGNOSIS — E7849 Other hyperlipidemia: Secondary | ICD-10-CM | POA: Diagnosis not present

## 2019-09-22 DIAGNOSIS — I10 Essential (primary) hypertension: Secondary | ICD-10-CM | POA: Diagnosis not present

## 2019-09-23 ENCOUNTER — Ambulatory Visit (INDEPENDENT_AMBULATORY_CARE_PROVIDER_SITE_OTHER): Payer: BC Managed Care – PPO | Admitting: Family Medicine

## 2019-09-23 ENCOUNTER — Other Ambulatory Visit: Payer: Self-pay

## 2019-09-23 ENCOUNTER — Encounter: Payer: Self-pay | Admitting: Family Medicine

## 2019-09-23 VITALS — BP 117/77 | Temp 97.8°F | Ht 71.0 in | Wt 253.0 lb

## 2019-09-23 DIAGNOSIS — E785 Hyperlipidemia, unspecified: Secondary | ICD-10-CM | POA: Diagnosis not present

## 2019-09-23 DIAGNOSIS — E1140 Type 2 diabetes mellitus with diabetic neuropathy, unspecified: Secondary | ICD-10-CM | POA: Diagnosis not present

## 2019-09-23 DIAGNOSIS — M159 Polyosteoarthritis, unspecified: Secondary | ICD-10-CM

## 2019-09-23 DIAGNOSIS — M8949 Other hypertrophic osteoarthropathy, multiple sites: Secondary | ICD-10-CM

## 2019-09-23 DIAGNOSIS — I10 Essential (primary) hypertension: Secondary | ICD-10-CM

## 2019-09-23 DIAGNOSIS — I272 Pulmonary hypertension, unspecified: Secondary | ICD-10-CM

## 2019-09-23 DIAGNOSIS — M009 Pyogenic arthritis, unspecified: Secondary | ICD-10-CM | POA: Diagnosis not present

## 2019-09-23 DIAGNOSIS — G4733 Obstructive sleep apnea (adult) (pediatric): Secondary | ICD-10-CM

## 2019-09-23 NOTE — Progress Notes (Signed)
Name: Becky Trevino   MRN: [id number]    DOB: Oct 23, 1945   Date:09/23/2019       Progress Note  Subjective  Chief Complaint  Chief Complaint  Patient presents with  . Medication Refill  . Diabetes    Glucose this morning was 97, checks bs once to twicw month  . Hypertension    Denies any symptoms  . Obesity Morbid  . Hyperlipidemia  . Insomnia/OSA    I connected with  [name] on 09/23/19 at  1:40 PM EST by telephone and verified that I am speaking with the correct person using two identifiers.  I discussed the limitations, risks, security and privacy concerns of performing an evaluation and management service by telephone and the availability of in person appointments. Staff also discussed with the patient that there may be a patient responsible charge related to this service. Patient Location: at home  Provider Location: [facility]   HPI  DMII with renal manifestation and neuropathy: fsbsstill below 100s most of the times. HbgA1C was 5.7% last visit and we will recheck it today. Urine microwasback to normal, not on ARB because of angiodema. Doing well, no polyphagia or polydipsia, sheused to have urinary frequency but controlled with medication given by Urologist.She sees Podiatrist she has corns and neuropathy, taking gabapentin . On life style modification only. She states eye exam is up to date and we will obtain copy of results. She sees Dr. Gloriann Loan   Obesity Morbid:her weight has gone up a little to 260.6lbs.. She has DM, insulin resistance,sheis off Victoza,she states weight watcher and nutrisystem is working well for her weight is down to 253.7lbs  Hypotension : hypertension resolved, bp has dropped and she is on Midodrine . She is concerned because she is still taking Metoprolol 100 mg. Explained we will need to get answer from Dr. Clayborn Bigness since I am not sure if taking for rhythm control   Hyperlipidemia: taking Atorvastatin  daily and denies side effects. No chest pain , no muscles pain. We will recheck labs next visit   Insomnia/OSA: She is wearing CPAP and is doing well, she has not been taking Trazodone anymore and is sleeping better . Last Echo done by Dr. Clayborn Bigness showed mild pulmonary hypertension   OA: multi joints, sees Ortho, Dr. Rudene Christians . She had revision of right knee replacement March 2019 because of infection. Still under the care of Ortho and also ID,she isstill taking oral antibiotics daily,she denies redness or pain on right knee.  Unchanged  S/p ankle fracture: slipped on her bathroom rug on Dec 12 th, 2020, she had left right ankle surgical repair on 09/02/2019. She states she has been doing well , she is still taking pain medications  She has seen Dr. Clayborn Bigness and had a holter monitor, she is taking Xarelto 20 mg since December but I cannot see for what diagnosis, we will ask him about it . She is still on Lovenox, did not start Xarelto, was given 20 mg dose only, not 15 mg, we will reach out to Dr. Clayborn Bigness   Patient Active Problem List   Diagnosis Date Noted  . Hematoma   . Syncope 08/07/2019  . Anemia 08/07/2019  . Chronic infection of right knee ([place]) 11/25/2018  . Infection and inflammatory reaction due to internal joint prosthesis, subsequent encounter 01/25/2018  . Infection of total right knee replacement ([place]) 11/19/2017  . Type 2 diabetes mellitus with diabetic neuropathy, without long-term current use of insulin (  [place]) 11/06/2016  . Primary osteoarthritis of knee 03/11/2016  . Primary osteoarthritis of left hip 12/11/2015  . Benign essential HTN 04/21/2015  . Edema leg 04/21/2015  . Chronic kidney disease (CKD), stage III (moderate) 04/21/2015  . Chronic venous insufficiency 04/21/2015  . DDD (degenerative disc disease), cervical 04/21/2015  . Diabetes mellitus with renal manifestation ([place]) 04/21/2015  . Dyslipidemia 04/21/2015  . Bladder cystocele 04/21/2015  . Urinary  incontinence 04/21/2015  . Leg varices 04/21/2015  . Insomnia 04/21/2015  . Eczema intertrigo 04/21/2015  . Obesity, morbid ([place]) 04/21/2015  . Obstructive apnea 04/21/2015  . Menopausal and perimenopausal disorder 04/21/2015  . Supraventricular premature beats 04/21/2015  . Osteoarthritis of multiple joints 04/21/2015  . H/O Spinal surgery 11/14/2013  . Detrusor muscle hypertonia 11/07/2013    Past Surgical History:  Procedure Laterality Date  . ABDOMINAL HYSTERECTOMY  1993   Total  . BUNIONECTOMY Bilateral 1993  . I & D KNEE WITH POLY EXCHANGE Right 11/19/2017   Procedure: RIGHT KNEE POLY EXCHANGE WITH IRRIGATION AND DEBRIDEMENT;  Surgeon: Hessie Knows, MD;  Location: [facility] ORS;  Service: Orthopedics;  Laterality: Right;  . JOINT REPLACEMENT     bilateral knee  . LAMINECTOMY  11/14/2013    Cervical Fusion , [facility], Dr. Delilah Shan  . LASER ABLATION Bilateral 07/29/2012   Dr. Lucky Cowboy  . ORIF ANKLE FRACTURE Right 09/02/2019   Procedure: OPEN REDUCTION INTERNAL FIXATION (ORIF) ANKLE FRACTURE BIMALLEOLAR;  Surgeon: [name], DPM;  Location: [facility] ORS;  Service: Podiatry;  Laterality: Right;  . REPLACEMENT TOTAL KNEE Left 03/2010   Dr. Rudene Christians  . TOTAL HIP ARTHROPLASTY Left 12/11/2015   Procedure: TOTAL HIP ARTHROPLASTY ANTERIOR APPROACH;  Surgeon: Hessie Knows, MD;  Location: [facility] ORS;  Service: Orthopedics;  Laterality: Left;  . TOTAL KNEE ARTHROPLASTY Right 03/11/2016   Procedure: TOTAL KNEE ARTHROPLASTY;  Surgeon: Hessie Knows, MD;  Location: [facility] ORS;  Service: Orthopedics;  Laterality: Right;    Family History  Problem Relation Age of Onset  . Diabetes Mother   . Hyperlipidemia Mother   . Hypertension Mother   . Diabetes Father   . Hyperlipidemia Father   . Hypertension Father   . Obesity Father   . Hypertension Sister   . Hyperlipidemia Sister   . Hyperlipidemia Sister   . Breast cancer Neg Hx       Current Outpatient Medications:  .  acetaminophen (TYLENOL) 650 MG CR tablet,  Take 650-1,300 mg by mouth 2 (two) times daily as needed for pain., Disp: , Rfl:  .  atorvastatin (LIPITOR) 40 MG tablet, Take 1 tablet (40 mg total) by mouth daily at 6 PM. (Patient taking differently: Take 40 mg by mouth every evening. ), Disp: 90 tablet, Rfl: 1 .  Calcium Carbonate-Vitamin D (CALCIUM 600-D) 600-400 MG-UNIT per tablet, Take 1 tablet by mouth 2 (two) times daily., Disp: , Rfl:  .  celecoxib (CELEBREX) 200 MG capsule, Take 200 mg by mouth daily as needed for moderate pain. , Disp: , Rfl: 11 .  doxycycline (VIBRAMYCIN) 100 MG capsule, Take 100 mg by mouth 2 (two) times daily., Disp: , Rfl:  .  enoxaparin (LOVENOX) 40 MG/0.4ML injection, Inject 0.4 mLs (40 mg total) into the skin daily., Disp: 20 mL, Rfl: 0 .  fluticasone (FLONASE) 50 MCG/ACT nasal spray, Place 2 sprays into both nostrils daily., Disp: 16 g, Rfl: 2 .  glucose blood (ONE TOUCH ULTRA TEST) test strip, Use as instructed, Disp: 100 each, Rfl: 12 .  imipramine (TOFRANIL) 25  MG tablet, Take 1 tablet (25 mg total) by mouth at bedtime., Disp: 90 tablet, Rfl: 3 .  ketoconazole (NIZORAL) 2 % cream, Apply 1 application topically 2 (two) times daily as needed (rash). , Disp: , Rfl:  .  Magnesium Oxide 500 MG CAPS, Take 500 mg by mouth daily. , Disp: , Rfl:  .  metoprolol succinate (TOPROL-XL) 100 MG 24 hr tablet, Take 100 mg by mouth at bedtime., Disp: , Rfl:  .  midodrine (PROAMATINE) 5 MG tablet, Take 1 tablet (5 mg total) by mouth 3 (three) times daily with meals. (Patient taking differently: Take 5 mg by mouth 3 (three) times daily. ), Disp: 90 tablet, Rfl: 0 .  mirabegron ER (MYRBETRIQ) 25 MG TB24 tablet, Take 1 tablet (25 mg total) by mouth daily., Disp: 90 tablet, Rfl: 3 .  Netarsudil-Latanoprost (ROCKLATAN) 0.02-0.005 % SOLN, Place 1 drop into both eyes at bedtime., Disp: , Rfl:  .  oxycodone (OXY-IR) 5 MG capsule, Take 5 mg by mouth every 6 (six) hours as needed for pain., Disp: , Rfl:  .  oxyCODONE-acetaminophen  (PERCOCET) 7.5-325 MG tablet, Take 1 tablet by mouth every 6 (six) hours as needed., Disp: , Rfl:  .  potassium chloride (KLOR-CON) 10 MEQ tablet, Take 10 mEq by mouth daily., Disp: , Rfl:  .  traZODone (DESYREL) 50 MG tablet, TAKE 1 TABLET BY MOUTH AT BEDTIME AS NEEDED FOR SLEEP (Patient taking differently: Take 50 mg by mouth at bedtime as needed for sleep. ), Disp: 90 tablet, Rfl: 1 .  XARELTO 20 MG TABS tablet, Take 20 mg by mouth daily., Disp: , Rfl:  .  furosemide (LASIX) 20 MG tablet, Take 20 mg by mouth every evening., Disp: , Rfl:  .  gabapentin (NEURONTIN) 300 MG capsule, Take 1 capsule (300 mg total) by mouth every evening. (Patient not taking: Reported on 09/23/2019), Disp: 90 capsule, Rfl: 2  Allergies  Allergen Reactions  . Lisinopril Swelling    I personally reviewed active problem list, medication list, allergies, family history, social history with the patient/caregiver today.   ROS  Ten systems reviewed and is negative except as mentioned in HPI   Objective  Virtual encounter Vitals:   09/23/19 0809  BP: 117/77  Temp: 97.8 F (36.6 C)    Body mass index is 35.29 kg/m.  Physical Exam  Awake, alert and oriented   PHQ2/9: Depression screen [facility] 2/9 09/23/2019 08/23/2019 05/12/2019 11/25/2018 07/27/2018  Decreased Interest 0 0 0 0 0  Down, Depressed, Hopeless 0 0 0 0 0  PHQ - 2 Score 0 0 0 0 0  Altered sleeping 0 0 0 0 0  Tired, decreased energy 0 0 0 0 0  Change in appetite 0 0 0 0 1  Feeling bad or failure about yourself  0 0 0 0 0  Trouble concentrating 0 0 0 0 1  Moving slowly or fidgety/restless 0 0 0 0 0  Suicidal thoughts 0 0 0 - 0  PHQ-9 Score 0 0 0 0 2  Difficult doing work/chores - - Not difficult at all - Not difficult at all   PHQ-2/9 Result is negative.    Fall Risk: Fall Risk  09/23/2019 08/23/2019 05/12/2019 11/25/2018 07/27/2018  Falls in the past year? 1 0 0 1 0  Number falls in past yr: 0 0 0 1 0  Comment - - - - -  Injury with Fall? 1 0 0  0 0  Comment 07/2019 hematoma - - - -  Follow up - - Falls evaluation completed - -     Assessment & Plan  1. Chronic infection of right knee (HCC)  Seeing ortho and ID and doing bettter  2. Type 2 diabetes mellitus with diabetic neuropathy, without long-term current use of insulin (HCC)  Continue life style modification   3. Benign essential HTN  Resolved, taking medication to raise bp   4. Dyslipidemia  On statin therapy   5. Obstructive apnea  Continue cpap machine every night   6. Primary osteoarthritis involving multiple joints   7. Pulmonary hypertension ([place])  Seen on recent echo done by Dr. Clayborn Bigness   I discussed the assessment and treatment plan with the patient. The patient was provided an opportunity to ask questions and all were answered. The patient agreed with the plan and demonstrated an understanding of the instructions.   The patient was advised to call back or seek an in-person evaluation if the symptoms worsen or if the condition fails to improve as anticipated.  I provided 25 minutes of non-face-to-face time during this encounter.  Loistine Chance, MD

## 2019-09-28 DIAGNOSIS — S82891D Other fracture of right lower leg, subsequent encounter for closed fracture with routine healing: Secondary | ICD-10-CM | POA: Diagnosis not present

## 2019-09-28 DIAGNOSIS — T8131XA Disruption of external operation (surgical) wound, not elsewhere classified, initial encounter: Secondary | ICD-10-CM | POA: Diagnosis not present

## 2019-10-05 DIAGNOSIS — T8131XA Disruption of external operation (surgical) wound, not elsewhere classified, initial encounter: Secondary | ICD-10-CM | POA: Diagnosis not present

## 2019-10-05 DIAGNOSIS — S82891D Other fracture of right lower leg, subsequent encounter for closed fracture with routine healing: Secondary | ICD-10-CM | POA: Diagnosis not present

## 2019-10-05 DIAGNOSIS — E1142 Type 2 diabetes mellitus with diabetic polyneuropathy: Secondary | ICD-10-CM | POA: Diagnosis not present

## 2019-10-12 DIAGNOSIS — T8131XD Disruption of external operation (surgical) wound, not elsewhere classified, subsequent encounter: Secondary | ICD-10-CM | POA: Diagnosis not present

## 2019-10-12 DIAGNOSIS — I739 Peripheral vascular disease, unspecified: Secondary | ICD-10-CM | POA: Diagnosis not present

## 2019-10-12 DIAGNOSIS — E1142 Type 2 diabetes mellitus with diabetic polyneuropathy: Secondary | ICD-10-CM | POA: Diagnosis not present

## 2019-10-13 DIAGNOSIS — G4733 Obstructive sleep apnea (adult) (pediatric): Secondary | ICD-10-CM | POA: Diagnosis not present

## 2019-10-19 DIAGNOSIS — S82891D Other fracture of right lower leg, subsequent encounter for closed fracture with routine healing: Secondary | ICD-10-CM | POA: Diagnosis not present

## 2019-10-19 DIAGNOSIS — T8131XD Disruption of external operation (surgical) wound, not elsewhere classified, subsequent encounter: Secondary | ICD-10-CM | POA: Diagnosis not present

## 2019-10-21 DIAGNOSIS — M009 Pyogenic arthritis, unspecified: Secondary | ICD-10-CM | POA: Diagnosis not present

## 2019-10-21 DIAGNOSIS — Z96651 Presence of right artificial knee joint: Secondary | ICD-10-CM | POA: Diagnosis not present

## 2019-10-21 DIAGNOSIS — I87009 Postthrombotic syndrome without complications of unspecified extremity: Secondary | ICD-10-CM | POA: Diagnosis not present

## 2019-10-24 ENCOUNTER — Ambulatory Visit: Payer: BC Managed Care – PPO | Attending: Internal Medicine

## 2019-10-24 ENCOUNTER — Other Ambulatory Visit: Payer: Self-pay

## 2019-10-24 DIAGNOSIS — Z23 Encounter for immunization: Secondary | ICD-10-CM

## 2019-10-24 NOTE — Progress Notes (Signed)
   Covid-19 Vaccination Clinic  Name:  SHALAMAR JUHASZ    MRN: [id number] DOB: Jan 13, 1946  10/24/2019  Ms. Tuell was observed post Covid-19 immunization for 30 minutes based on pre-vaccination screening without incidence. She was provided with Vaccine Information Sheet and instruction to access the V-Safe system.   Ms. Wichmann was instructed to call 911 with any severe reactions post vaccine: Marland Kitchen Difficulty breathing  . Swelling of your face and throat  . A fast heartbeat  . A bad rash all over your body  . Dizziness and weakness    Immunizations Administered    Name Date Dose VIS Date Route   Moderna COVID-19 Vaccine 10/24/2019 12:31 PM 0.5 mL 08/16/2019 Intramuscular   Manufacturer: Levan Hurst   Lot: [id number]   NDC: [id number]   Moderna COVID-19 Vaccine 10/24/2019 12:26 PM 0.5 mL 08/16/2019 Intramuscular   Manufacturer: Moderna   Lot: [id number]   [id number]

## 2019-10-25 DIAGNOSIS — I739 Peripheral vascular disease, unspecified: Secondary | ICD-10-CM | POA: Diagnosis not present

## 2019-10-26 DIAGNOSIS — T8131XD Disruption of external operation (surgical) wound, not elsewhere classified, subsequent encounter: Secondary | ICD-10-CM | POA: Diagnosis not present

## 2019-10-26 DIAGNOSIS — I739 Peripheral vascular disease, unspecified: Secondary | ICD-10-CM | POA: Diagnosis not present

## 2019-10-26 DIAGNOSIS — E1142 Type 2 diabetes mellitus with diabetic polyneuropathy: Secondary | ICD-10-CM | POA: Diagnosis not present

## 2019-10-27 ENCOUNTER — Encounter: Payer: Self-pay | Admitting: Emergency Medicine

## 2019-10-27 ENCOUNTER — Other Ambulatory Visit: Payer: Self-pay

## 2019-10-27 ENCOUNTER — Emergency Department: Payer: BC Managed Care – PPO

## 2019-10-27 ENCOUNTER — Inpatient Hospital Stay
Admission: EM | Admit: 2019-10-27 | Discharge: 2019-11-07 | DRG: 862 | Disposition: A | Discharge status: Skilled Nursing Facility | Payer: BC Managed Care – PPO | Attending: Internal Medicine | Admitting: Internal Medicine

## 2019-10-27 DIAGNOSIS — E860 Dehydration: Secondary | ICD-10-CM | POA: Diagnosis present

## 2019-10-27 DIAGNOSIS — D6832 Hemorrhagic disorder due to extrinsic circulating anticoagulants: Secondary | ICD-10-CM | POA: Diagnosis not present

## 2019-10-27 DIAGNOSIS — Z79891 Long term (current) use of opiate analgesic: Secondary | ICD-10-CM

## 2019-10-27 DIAGNOSIS — Z90710 Acquired absence of both cervix and uterus: Secondary | ICD-10-CM

## 2019-10-27 DIAGNOSIS — T8144XA Sepsis following a procedure, initial encounter: Secondary | ICD-10-CM | POA: Diagnosis not present

## 2019-10-27 DIAGNOSIS — Z7401 Bed confinement status: Secondary | ICD-10-CM | POA: Diagnosis not present

## 2019-10-27 DIAGNOSIS — Z9181 History of falling: Secondary | ICD-10-CM

## 2019-10-27 DIAGNOSIS — G4733 Obstructive sleep apnea (adult) (pediatric): Secondary | ICD-10-CM | POA: Diagnosis present

## 2019-10-27 DIAGNOSIS — A419 Sepsis, unspecified organism: Secondary | ICD-10-CM | POA: Diagnosis not present

## 2019-10-27 DIAGNOSIS — L0390 Cellulitis, unspecified: Secondary | ICD-10-CM | POA: Diagnosis not present

## 2019-10-27 DIAGNOSIS — Z6835 Body mass index (BMI) 35.0-35.9, adult: Secondary | ICD-10-CM

## 2019-10-27 DIAGNOSIS — M79652 Pain in left thigh: Secondary | ICD-10-CM | POA: Diagnosis not present

## 2019-10-27 DIAGNOSIS — M778 Other enthesopathies, not elsewhere classified: Secondary | ICD-10-CM | POA: Diagnosis not present

## 2019-10-27 DIAGNOSIS — R42 Dizziness and giddiness: Secondary | ICD-10-CM | POA: Diagnosis not present

## 2019-10-27 DIAGNOSIS — T45515A Adverse effect of anticoagulants, initial encounter: Secondary | ICD-10-CM | POA: Diagnosis not present

## 2019-10-27 DIAGNOSIS — R0902 Hypoxemia: Secondary | ICD-10-CM | POA: Diagnosis not present

## 2019-10-27 DIAGNOSIS — I82401 Acute embolism and thrombosis of unspecified deep veins of right lower extremity: Secondary | ICD-10-CM | POA: Diagnosis not present

## 2019-10-27 DIAGNOSIS — K8020 Calculus of gallbladder without cholecystitis without obstruction: Secondary | ICD-10-CM | POA: Diagnosis present

## 2019-10-27 DIAGNOSIS — E669 Obesity, unspecified: Secondary | ICD-10-CM | POA: Diagnosis present

## 2019-10-27 DIAGNOSIS — N183 Chronic kidney disease, stage 3 unspecified: Secondary | ICD-10-CM | POA: Diagnosis present

## 2019-10-27 DIAGNOSIS — N1831 Chronic kidney disease, stage 3a: Secondary | ICD-10-CM | POA: Diagnosis present

## 2019-10-27 DIAGNOSIS — L97929 Non-pressure chronic ulcer of unspecified part of left lower leg with unspecified severity: Secondary | ICD-10-CM | POA: Diagnosis present

## 2019-10-27 DIAGNOSIS — I129 Hypertensive chronic kidney disease with stage 1 through stage 4 chronic kidney disease, or unspecified chronic kidney disease: Secondary | ICD-10-CM | POA: Diagnosis present

## 2019-10-27 DIAGNOSIS — Z79899 Other long term (current) drug therapy: Secondary | ICD-10-CM

## 2019-10-27 DIAGNOSIS — L03115 Cellulitis of right lower limb: Secondary | ICD-10-CM | POA: Diagnosis not present

## 2019-10-27 DIAGNOSIS — R7881 Bacteremia: Secondary | ICD-10-CM | POA: Diagnosis present

## 2019-10-27 DIAGNOSIS — I70233 Atherosclerosis of native arteries of right leg with ulceration of ankle: Secondary | ICD-10-CM | POA: Diagnosis not present

## 2019-10-27 DIAGNOSIS — G0491 Myelitis, unspecified: Secondary | ICD-10-CM | POA: Diagnosis present

## 2019-10-27 DIAGNOSIS — X58XXXD Exposure to other specified factors, subsequent encounter: Secondary | ICD-10-CM | POA: Diagnosis present

## 2019-10-27 DIAGNOSIS — M19071 Primary osteoarthritis, right ankle and foot: Secondary | ICD-10-CM | POA: Diagnosis not present

## 2019-10-27 DIAGNOSIS — E119 Type 2 diabetes mellitus without complications: Secondary | ICD-10-CM | POA: Diagnosis not present

## 2019-10-27 DIAGNOSIS — E1122 Type 2 diabetes mellitus with diabetic chronic kidney disease: Secondary | ICD-10-CM | POA: Diagnosis not present

## 2019-10-27 DIAGNOSIS — A401 Sepsis due to streptococcus, group B: Secondary | ICD-10-CM | POA: Diagnosis not present

## 2019-10-27 DIAGNOSIS — Z96653 Presence of artificial knee joint, bilateral: Secondary | ICD-10-CM | POA: Diagnosis present

## 2019-10-27 DIAGNOSIS — Z09 Encounter for follow-up examination after completed treatment for conditions other than malignant neoplasm: Secondary | ICD-10-CM

## 2019-10-27 DIAGNOSIS — T84629A Infection and inflammatory reaction due to internal fixation device of unspecified bone of leg, initial encounter: Secondary | ICD-10-CM | POA: Diagnosis not present

## 2019-10-27 DIAGNOSIS — E1151 Type 2 diabetes mellitus with diabetic peripheral angiopathy without gangrene: Secondary | ICD-10-CM | POA: Diagnosis present

## 2019-10-27 DIAGNOSIS — Z833 Family history of diabetes mellitus: Secondary | ICD-10-CM

## 2019-10-27 DIAGNOSIS — Y92009 Unspecified place in unspecified non-institutional (private) residence as the place of occurrence of the external cause: Secondary | ICD-10-CM

## 2019-10-27 DIAGNOSIS — Z20822 Contact with and (suspected) exposure to covid-19: Secondary | ICD-10-CM | POA: Diagnosis not present

## 2019-10-27 DIAGNOSIS — I82431 Acute embolism and thrombosis of right popliteal vein: Secondary | ICD-10-CM | POA: Diagnosis present

## 2019-10-27 DIAGNOSIS — Z96642 Presence of left artificial hip joint: Secondary | ICD-10-CM | POA: Diagnosis not present

## 2019-10-27 DIAGNOSIS — T45516A Underdosing of anticoagulants, initial encounter: Secondary | ICD-10-CM | POA: Diagnosis present

## 2019-10-27 DIAGNOSIS — S82451D Displaced comminuted fracture of shaft of right fibula, subsequent encounter for closed fracture with routine healing: Secondary | ICD-10-CM | POA: Diagnosis not present

## 2019-10-27 DIAGNOSIS — E785 Hyperlipidemia, unspecified: Secondary | ICD-10-CM | POA: Diagnosis present

## 2019-10-27 DIAGNOSIS — Z8672 Personal history of thrombophlebitis: Secondary | ICD-10-CM

## 2019-10-27 DIAGNOSIS — E1142 Type 2 diabetes mellitus with diabetic polyneuropathy: Secondary | ICD-10-CM | POA: Diagnosis not present

## 2019-10-27 DIAGNOSIS — S8261XD Displaced fracture of lateral malleolus of right fibula, subsequent encounter for closed fracture with routine healing: Secondary | ICD-10-CM | POA: Diagnosis not present

## 2019-10-27 DIAGNOSIS — N179 Acute kidney failure, unspecified: Secondary | ICD-10-CM | POA: Diagnosis not present

## 2019-10-27 DIAGNOSIS — Z8249 Family history of ischemic heart disease and other diseases of the circulatory system: Secondary | ICD-10-CM

## 2019-10-27 DIAGNOSIS — M5030 Other cervical disc degeneration, unspecified cervical region: Secondary | ICD-10-CM | POA: Diagnosis not present

## 2019-10-27 DIAGNOSIS — Y838 Other surgical procedures as the cause of abnormal reaction of the patient, or of later complication, without mention of misadventure at the time of the procedure: Secondary | ICD-10-CM | POA: Diagnosis present

## 2019-10-27 DIAGNOSIS — L97312 Non-pressure chronic ulcer of right ankle with fat layer exposed: Secondary | ICD-10-CM | POA: Diagnosis not present

## 2019-10-27 DIAGNOSIS — S82831A Other fracture of upper and lower end of right fibula, initial encounter for closed fracture: Secondary | ICD-10-CM | POA: Diagnosis not present

## 2019-10-27 DIAGNOSIS — Z981 Arthrodesis status: Secondary | ICD-10-CM | POA: Diagnosis not present

## 2019-10-27 DIAGNOSIS — M7731 Calcaneal spur, right foot: Secondary | ICD-10-CM | POA: Diagnosis not present

## 2019-10-27 DIAGNOSIS — M2550 Pain in unspecified joint: Secondary | ICD-10-CM | POA: Diagnosis not present

## 2019-10-27 DIAGNOSIS — Z96651 Presence of right artificial knee joint: Secondary | ICD-10-CM | POA: Diagnosis not present

## 2019-10-27 DIAGNOSIS — N189 Chronic kidney disease, unspecified: Secondary | ICD-10-CM | POA: Diagnosis not present

## 2019-10-27 DIAGNOSIS — B954 Other streptococcus as the cause of diseases classified elsewhere: Secondary | ICD-10-CM | POA: Diagnosis not present

## 2019-10-27 DIAGNOSIS — R531 Weakness: Secondary | ICD-10-CM | POA: Diagnosis not present

## 2019-10-27 DIAGNOSIS — Z7901 Long term (current) use of anticoagulants: Secondary | ICD-10-CM | POA: Diagnosis not present

## 2019-10-27 DIAGNOSIS — I10 Essential (primary) hypertension: Secondary | ICD-10-CM | POA: Diagnosis not present

## 2019-10-27 DIAGNOSIS — I872 Venous insufficiency (chronic) (peripheral): Secondary | ICD-10-CM | POA: Diagnosis present

## 2019-10-27 DIAGNOSIS — I8390 Asymptomatic varicose veins of unspecified lower extremity: Secondary | ICD-10-CM | POA: Diagnosis present

## 2019-10-27 DIAGNOSIS — I87099 Postthrombotic syndrome with other complications of unspecified lower extremity: Secondary | ICD-10-CM | POA: Diagnosis present

## 2019-10-27 DIAGNOSIS — R404 Transient alteration of awareness: Secondary | ICD-10-CM | POA: Diagnosis not present

## 2019-10-27 DIAGNOSIS — I959 Hypotension, unspecified: Secondary | ICD-10-CM | POA: Diagnosis not present

## 2019-10-27 DIAGNOSIS — Z95828 Presence of other vascular implants and grafts: Secondary | ICD-10-CM | POA: Diagnosis not present

## 2019-10-27 DIAGNOSIS — E1169 Type 2 diabetes mellitus with other specified complication: Secondary | ICD-10-CM | POA: Diagnosis present

## 2019-10-27 DIAGNOSIS — R4182 Altered mental status, unspecified: Secondary | ICD-10-CM | POA: Diagnosis not present

## 2019-10-27 DIAGNOSIS — I70239 Atherosclerosis of native arteries of right leg with ulceration of unspecified site: Secondary | ICD-10-CM | POA: Diagnosis present

## 2019-10-27 DIAGNOSIS — L97919 Non-pressure chronic ulcer of unspecified part of right lower leg with unspecified severity: Secondary | ICD-10-CM | POA: Diagnosis present

## 2019-10-27 DIAGNOSIS — I82811 Embolism and thrombosis of superficial veins of right lower extremities: Secondary | ICD-10-CM | POA: Diagnosis not present

## 2019-10-27 DIAGNOSIS — T8131XA Disruption of external operation (surgical) wound, not elsewhere classified, initial encounter: Secondary | ICD-10-CM | POA: Diagnosis not present

## 2019-10-27 DIAGNOSIS — Z8349 Family history of other endocrine, nutritional and metabolic diseases: Secondary | ICD-10-CM

## 2019-10-27 DIAGNOSIS — B951 Streptococcus, group B, as the cause of diseases classified elsewhere: Secondary | ICD-10-CM

## 2019-10-27 DIAGNOSIS — T148XXA Other injury of unspecified body region, initial encounter: Secondary | ICD-10-CM | POA: Diagnosis not present

## 2019-10-27 DIAGNOSIS — B9562 Methicillin resistant Staphylococcus aureus infection as the cause of diseases classified elsewhere: Secondary | ICD-10-CM | POA: Diagnosis present

## 2019-10-27 DIAGNOSIS — Z86718 Personal history of other venous thrombosis and embolism: Secondary | ICD-10-CM | POA: Diagnosis present

## 2019-10-27 DIAGNOSIS — I70249 Atherosclerosis of native arteries of left leg with ulceration of unspecified site: Secondary | ICD-10-CM | POA: Diagnosis present

## 2019-10-27 DIAGNOSIS — S82891G Other fracture of right lower leg, subsequent encounter for closed fracture with delayed healing: Secondary | ICD-10-CM

## 2019-10-27 DIAGNOSIS — Z743 Need for continuous supervision: Secondary | ICD-10-CM | POA: Diagnosis not present

## 2019-10-27 DIAGNOSIS — Z888 Allergy status to other drugs, medicaments and biological substances status: Secondary | ICD-10-CM | POA: Diagnosis not present

## 2019-10-27 DIAGNOSIS — Z794 Long term (current) use of insulin: Secondary | ICD-10-CM | POA: Diagnosis not present

## 2019-10-27 DIAGNOSIS — W19XXXD Unspecified fall, subsequent encounter: Secondary | ICD-10-CM | POA: Diagnosis not present

## 2019-10-27 DIAGNOSIS — S7012XA Contusion of left thigh, initial encounter: Secondary | ICD-10-CM | POA: Diagnosis not present

## 2019-10-27 DIAGNOSIS — E1140 Type 2 diabetes mellitus with diabetic neuropathy, unspecified: Secondary | ICD-10-CM | POA: Diagnosis present

## 2019-10-27 DIAGNOSIS — Z91128 Patient's intentional underdosing of medication regimen for other reason: Secondary | ICD-10-CM

## 2019-10-27 DIAGNOSIS — S8251XD Displaced fracture of medial malleolus of right tibia, subsequent encounter for closed fracture with routine healing: Secondary | ICD-10-CM | POA: Diagnosis not present

## 2019-10-27 DIAGNOSIS — M7981 Nontraumatic hematoma of soft tissue: Secondary | ICD-10-CM | POA: Diagnosis not present

## 2019-10-27 DIAGNOSIS — W19XXXA Unspecified fall, initial encounter: Secondary | ICD-10-CM | POA: Diagnosis not present

## 2019-10-27 LAB — CBC WITH DIFFERENTIAL/PLATELET
Abs Immature Granulocytes: 0.1 10*3/uL — ABNORMAL HIGH (ref 0.00–0.07)
Basophils Absolute: 0 10*3/uL (ref 0.0–0.1)
Basophils Relative: 0 %
Eosinophils Absolute: 0 10*3/uL (ref 0.0–0.5)
Eosinophils Relative: 0 %
HCT: 40.9 % (ref 36.0–46.0)
Hemoglobin: 12.9 g/dL (ref 12.0–15.0)
Immature Granulocytes: 1 %
Lymphocytes Relative: 4 %
Lymphs Abs: 0.7 10*3/uL (ref 0.7–4.0)
MCH: 29.1 pg (ref 26.0–34.0)
MCHC: 31.5 g/dL (ref 30.0–36.0)
MCV: 92.3 fL (ref 80.0–100.0)
Monocytes Absolute: 0.6 10*3/uL (ref 0.1–1.0)
Monocytes Relative: 4 %
Neutro Abs: 15.3 10*3/uL — ABNORMAL HIGH (ref 1.7–7.7)
Neutrophils Relative %: 91 %
Platelets: 131 10*3/uL — ABNORMAL LOW (ref 150–400)
RBC: 4.43 MIL/uL (ref 3.87–5.11)
RDW: 16.9 % — ABNORMAL HIGH (ref 11.5–15.5)
WBC: 16.7 10*3/uL — ABNORMAL HIGH (ref 4.0–10.5)
nRBC: 0 % (ref 0.0–0.2)

## 2019-10-27 LAB — COMPREHENSIVE METABOLIC PANEL
ALT: 16 U/L (ref 0–44)
AST: 19 U/L (ref 15–41)
Albumin: 3.9 g/dL (ref 3.5–5.0)
Alkaline Phosphatase: 121 U/L (ref 38–126)
Anion gap: 11 (ref 5–15)
BUN: 18 mg/dL (ref 8–23)
CO2: 23 mmol/L (ref 22–32)
Calcium: 9.6 mg/dL (ref 8.9–10.3)
Chloride: 100 mmol/L (ref 98–111)
Creatinine, Ser: 1.27 mg/dL — ABNORMAL HIGH (ref 0.44–1.00)
GFR calc Af Amer: 48 mL/min — ABNORMAL LOW (ref >60)
GFR calc non Af Amer: 42 mL/min — ABNORMAL LOW (ref >60)
Glucose, Bld: 121 mg/dL — ABNORMAL HIGH (ref 70–99)
Potassium: 4 mmol/L (ref 3.5–5.1)
Sodium: 134 mmol/L — ABNORMAL LOW (ref 135–145)
Total Bilirubin: 0.8 mg/dL (ref 0.3–1.2)
Total Protein: 8.5 g/dL — ABNORMAL HIGH (ref 6.5–8.1)

## 2019-10-27 LAB — LACTIC ACID, PLASMA
Lactic Acid, Venous: 1.8 mmol/L (ref 0.5–1.9)
Lactic Acid, Venous: 1.7 mmol/L (ref 0.5–1.9)

## 2019-10-27 MED ORDER — VANCOMYCIN HCL IN DEXTROSE 1-5 GM/200ML-% IV SOLN
1000.0000 mg | Freq: Once | INTRAVENOUS | Status: AC
  Administered 2019-10-27: 1000 mg via INTRAVENOUS
  Filled 2019-10-27: qty 200

## 2019-10-27 MED ORDER — VANCOMYCIN HCL IN DEXTROSE 1-5 GM/200ML-% IV SOLN
1000.0000 mg | Freq: Once | INTRAVENOUS | Status: AC
  Administered 2019-10-27: 23:00:00 1000 mg via INTRAVENOUS
  Filled 2019-10-27: qty 200

## 2019-10-27 MED ORDER — LACTATED RINGERS IV BOLUS
1000.0000 mL | Freq: Once | INTRAVENOUS | Status: AC
  Administered 2019-10-27: 22:00:00 1000 mL via INTRAVENOUS

## 2019-10-27 MED ORDER — SODIUM CHLORIDE 0.9 % IV SOLN
2.0000 g | Freq: Once | INTRAVENOUS | Status: AC
  Administered 2019-10-27: 23:00:00 2 g via INTRAVENOUS
  Filled 2019-10-27: qty 20

## 2019-10-27 NOTE — ED Notes (Signed)
This RN called pt husband Roshena Beilstein with permission from pt. This RN informed husband on pt plan of care and admission. Pt husband states understanding and would like to be updated on pt status.

## 2019-10-27 NOTE — ED Notes (Signed)
Pt at CT

## 2019-10-27 NOTE — ED Provider Notes (Signed)
[facility] Emergency Department Provider Note   ____________________________________________   First MD Initiated Contact with Patient 10/27/19 2109     (approximate)  I have reviewed the triage vital signs and the nursing notes.   HISTORY  Chief Complaint Altered Mental Status    HPI Becky Trevino is a 74 y.o. female with past medical history of CKD, hypertension, hyperlipidemia, and diabetes who presents to the ED for altered mental status.  History is limited due to patient's confusion.  Per EMS, family noticed that she seemed increasingly confused starting this afternoon.  She is typically alert and oriented with no difficulty caring for herself, ambulates without difficulty.  She was last seen normal at 1 PM this afternoon, and EMS did not notice any focal deficits during transport.  Patient states that her right leg was hurting her earlier in the day, but it does not feel as bad now.  She denies any fevers, headache, neck pain, chest pain, cough, shortness of breath, vomiting, diarrhea, dysuria, or hematuria.  She recently underwent surgery to her right ankle and has a wound VAC in place, states the wound has been healing well.        Past Medical History:  Diagnosis Date  . Cervical spondylosis   . Chronic kidney disease    Stage 3  . DDD (degenerative disc disease), cervical    [facility]  . Diabetes mellitus without complication ([place])   . Diverticulosis   . Hyperlipidemia   . Hypertension   . Intertrigo   . Leg cramps   . Leg varices   . Obesity   . OSA (obstructive sleep apnea)   . Symptomatic menopausal or female climacteric states   . Syncope     Patient Active Problem List   Diagnosis Date Noted  . Cellulitis of right leg 10/27/2019  . Hematoma   . Syncope 08/07/2019  . Anemia 08/07/2019  . Chronic infection of right knee ([place]) 11/25/2018  . Infection and inflammatory reaction due to internal joint prosthesis,  subsequent encounter 01/25/2018  . Infection of total right knee replacement ([place]) 11/19/2017  . Type 2 diabetes mellitus with diabetic neuropathy, without long-term current use of insulin ([place]) 11/06/2016  . Primary osteoarthritis of knee 03/11/2016  . Primary osteoarthritis of left hip 12/11/2015  . Benign essential HTN 04/21/2015  . Edema leg 04/21/2015  . Chronic kidney disease (CKD), stage III (moderate) 04/21/2015  . Chronic venous insufficiency 04/21/2015  . DDD (degenerative disc disease), cervical 04/21/2015  . Diabetes mellitus with renal manifestation ([place]) 04/21/2015  . Dyslipidemia 04/21/2015  . Bladder cystocele 04/21/2015  . Urinary incontinence 04/21/2015  . Leg varices 04/21/2015  . Insomnia 04/21/2015  . Eczema intertrigo 04/21/2015  . Obstructive apnea 04/21/2015  . Menopausal and perimenopausal disorder 04/21/2015  . Supraventricular premature beats 04/21/2015  . Osteoarthritis of multiple joints 04/21/2015  . H/O Spinal surgery 11/14/2013  . Detrusor muscle hypertonia 11/07/2013    Past Surgical History:  Procedure Laterality Date  . ABDOMINAL HYSTERECTOMY  1993   Total  . BUNIONECTOMY Bilateral 1993  . I & D KNEE WITH POLY EXCHANGE Right 11/19/2017   Procedure: RIGHT KNEE POLY EXCHANGE WITH IRRIGATION AND DEBRIDEMENT;  Surgeon: Hessie Knows, MD;  Location: [facility] ORS;  Service: Orthopedics;  Laterality: Right;  . JOINT REPLACEMENT     bilateral knee  . LAMINECTOMY  11/14/2013    Cervical Fusion , [facility], Dr. Delilah Shan  . LASER ABLATION Bilateral 07/29/2012   Dr. Lucky Cowboy  .  ORIF ANKLE FRACTURE Right 09/02/2019   Procedure: OPEN REDUCTION INTERNAL FIXATION (ORIF) ANKLE FRACTURE BIMALLEOLAR;  Surgeon: [name], DPM;  Location: [facility] ORS;  Service: Podiatry;  Laterality: Right;  . REPLACEMENT TOTAL KNEE Left 03/2010   Dr. Rudene Christians  . TOTAL HIP ARTHROPLASTY Left 12/11/2015   Procedure: TOTAL HIP ARTHROPLASTY ANTERIOR APPROACH;  Surgeon: Hessie Knows, MD;  Location: [facility]  ORS;  Service: Orthopedics;  Laterality: Left;  . TOTAL KNEE ARTHROPLASTY Right 03/11/2016   Procedure: TOTAL KNEE ARTHROPLASTY;  Surgeon: Hessie Knows, MD;  Location: [facility] ORS;  Service: Orthopedics;  Laterality: Right;    Prior to Admission medications   Medication Sig Start Date End Date Taking? Authorizing Provider  acetaminophen (TYLENOL) 650 MG CR tablet Take 650-1,300 mg by mouth 2 (two) times daily as needed for pain.   Yes [provider]  atorvastatin (LIPITOR) 40 MG tablet Take 1 tablet (40 mg total) by mouth daily at 6 PM. Patient taking differently: Take 40 mg by mouth every evening.  05/12/19  Yes Sowles, Drue Stager, MD  Calcium Carbonate-Vitamin D (CALCIUM 600-D) 600-400 MG-UNIT per tablet Take 1 tablet by mouth 2 (two) times daily. 02/01/09  Yes [provider]  doxycycline (VIBRAMYCIN) 100 MG capsule Take 100 mg by mouth 2 (two) times daily. 08/25/19  Yes [provider]  enoxaparin (LOVENOX) 40 MG/0.4ML injection Inject 0.4 mLs (40 mg total) into the skin daily. 09/03/19  Yes [name], DPM  fluticasone (FLONASE) 50 MCG/ACT nasal spray Place 2 sprays into both nostrils daily. 05/21/17  Yes Sowles, Drue Stager, MD  glucose blood (ONE TOUCH ULTRA TEST) test strip Use as instructed 08/27/15  Yes Sowles, Drue Stager, MD  imipramine (TOFRANIL) 25 MG tablet Take 1 tablet (25 mg total) by mouth at bedtime. 05/06/19  Yes [name], Larene Beach A, PA-C  ketoconazole (NIZORAL) 2 % cream Apply 1 application topically 2 (two) times daily as needed (rash).  01/26/15  Yes [provider]  Magnesium Oxide 500 MG CAPS Take 500 mg by mouth daily.  11/10/07  Yes [provider]  metoprolol succinate (TOPROL-XL) 100 MG 24 hr tablet Take 100 mg by mouth at bedtime. 08/18/19  Yes [provider]  midodrine (PROAMATINE) 5 MG tablet Take 1 tablet (5 mg total) by mouth 3 (three) times daily with meals. Patient taking differently: Take 5 mg by mouth 3 (three) times daily.   08/09/19  Yes Nicole Kindred A, DO  mirabegron ER (MYRBETRIQ) 25 MG TB24 tablet Take 1 tablet (25 mg total) by mouth daily. 06/07/19  Yes [name], Larene Beach A, PA-C  oxyCODONE-acetaminophen (PERCOCET) 7.5-325 MG tablet Take 1 tablet by mouth every 4 (four) hours as needed for severe pain.   Yes [provider]  SIMBRINZA 1-0.2 % SUSP Place 1 drop into both eyes 2 (two) times daily. 10/13/19  Yes [provider]  celecoxib (CELEBREX) 200 MG capsule Take 200 mg by mouth daily as needed for moderate pain.  07/20/17   [provider]  furosemide (LASIX) 20 MG tablet Take 20 mg by mouth every evening.    [provider]  gabapentin (NEURONTIN) 300 MG capsule Take 1 capsule (300 mg total) by mouth every evening. Patient not taking: Reported on 09/23/2019 11/25/18   [name], MD  Netarsudil-Latanoprost (ROCKLATAN) 0.02-0.005 % SOLN Place 1 drop into both eyes at bedtime.    [provider]  oxycodone (OXY-IR) 5 MG capsule Take 5 mg by mouth every 6 (six) hours as needed for pain.    [provider]  oxyCODONE-acetaminophen (PERCOCET/ROXICET) 5-325 MG tablet Take 1 tablet by mouth as directed. 10/12/19   [provider]  potassium chloride (KLOR-CON) 10 MEQ tablet Take 10 mEq by mouth daily. 08/18/19   [provider]  traZODone (DESYREL) 50 MG tablet TAKE 1 TABLET BY MOUTH AT BEDTIME AS NEEDED FOR SLEEP Patient not taking: Reported on 10/27/2019 08/29/18   [name], MD  XARELTO 20 MG TABS tablet Take 20 mg by mouth daily. 09/22/19   Callwood, Karma Greaser D, MD    Allergies Lisinopril  Family History  Problem Relation Age of Onset  . Diabetes Mother   . Hyperlipidemia Mother   . Hypertension Mother   . Diabetes Father   . Hyperlipidemia Father   . Hypertension Father   . Obesity Father   . Hypertension Sister   . Hyperlipidemia Sister   . Hyperlipidemia Sister   . Breast cancer Neg Hx     Social History Social History     Tobacco Use  . Smoking status: Never Smoker  . Smokeless tobacco: Never Used  Substance Use Topics  . Alcohol use: No    Alcohol/week: 0.0 standard drinks  . Drug use: No    Review of Systems  Constitutional: No fever/chills Eyes: No visual changes. ENT: No sore throat. Cardiovascular: Denies chest pain. Respiratory: Denies shortness of breath. Gastrointestinal: No abdominal pain.  No nausea, no vomiting.  No diarrhea.  No constipation. Genitourinary: Negative for dysuria. Musculoskeletal: Negative for back pain.  Positive for right leg pain. Skin: Negative for rash. Neurological: Negative for headaches, focal weakness or numbness.  Positive for confusion.  ____________________________________________   PHYSICAL EXAM:  VITAL SIGNS: ED Triage Vitals [10/27/19 2108]  Enc Vitals Group     BP      Pulse      Resp      Temp      Temp src      SpO2      Weight 253 lb (114.8 kg)     Height 5\' 11"  (1.803 m)     Head Circumference      Peak Flow      Pain Score 0     Pain Loc      Pain Edu?      Excl. in [place]?     Constitutional: Alert and oriented to person, place, and time. Eyes: Conjunctivae are normal.  Pupils equal round and reactive to light bilaterally. Head: Atraumatic. Nose: No congestion/rhinnorhea. Mouth/Throat: Mucous membranes are moist. Neck: Normal ROM Cardiovascular: Normal rate, regular rhythm. Grossly normal heart sounds. Respiratory: Normal respiratory effort.  No retractions. Lungs CTAB. Gastrointestinal: Soft and nontender. No distention. Genitourinary: deferred Musculoskeletal: No lower extremity tenderness nor edema. Neurologic: Slow to respond with difficulty following commands. No gross focal neurologic deficits are appreciated. Skin:  Skin is warm, dry and intact. No rash noted. Psychiatric: Mood and affect are normal. Speech and behavior are normal.  ____________________________________________   LABS (all labs ordered are listed,  but only abnormal results are displayed)  Labs Reviewed  CBC WITH DIFFERENTIAL/PLATELET - Abnormal; Notable for the following components:      Result Value   WBC 16.7 (*)    RDW 16.9 (*)    Platelets 131 (*)    Neutro Abs 15.3 (*)    Abs Immature Granulocytes 0.10 (*)    All other components within normal limits  COMPREHENSIVE METABOLIC PANEL - Abnormal; Notable for the following components:   Sodium 134 (*)  Glucose, Bld 121 (*)    Creatinine, Ser 1.27 (*)    Total Protein 8.5 (*)    GFR calc non Af Amer 42 (*)    GFR calc Af Amer 48 (*)    All other components within normal limits  CULTURE, BLOOD (ROUTINE X 2)  CULTURE, BLOOD (ROUTINE X 2)  SARS CORONAVIRUS 2 (TAT 6-24 HRS)  LACTIC ACID, PLASMA  LACTIC ACID, PLASMA  URINALYSIS, COMPLETE (UACMP) WITH MICROSCOPIC   ____________________________________________  EKG  ED ECG REPORT I, Blake Divine, the attending physician, personally viewed and interpreted this ECG.   Date: 10/28/2019  EKG Time: 21:15  Rate: 97  Rhythm: normal sinus rhythm  Axis: Normal  Intervals:none  ST&T Change: None   PROCEDURES  Procedure(s) performed (including Critical Care):  Procedures   ____________________________________________   INITIAL IMPRESSION / ASSESSMENT AND PLAN / ED COURSE       74 year old female presents to the ED for confusion noted this afternoon after last being seen well around 1 PM.  She has no focal neurologic deficits on exam and stroke seems unlikely, CT head negative.  She does seem somewhat confused however, is slow to respond to questions and has some difficulty following commands.  Exam is concerning for cellulitis to her right lower extremity, more proximal from her prior surgical site.  Doubt septic arthritis as she is able to range her right knee without difficulty.  Lab work shows leukocytosis, will start patient on Rocephin and vancomycin for apparent cellulitis.  Ultrasound was obtained and positive  for DVT in her popliteal vein.  Case discussed with hospitalist for admission.      ____________________________________________   FINAL CLINICAL IMPRESSION(S) / ED DIAGNOSES  Final diagnoses:  Cellulitis of right lower extremity  Altered mental status, unspecified altered mental status type     ED Discharge Orders    None       Note:  This document was prepared using Dragon voice recognition software and may include unintentional dictation errors.   Blake Divine, MD 10/28/19 [phone]

## 2019-10-27 NOTE — ED Triage Notes (Signed)
Pt arrived to ED via [facility] from home. Family reports that pt was found with new altered mental status and was last seen around 1300 today at baseline. States pt usually A&O x4. Upon arrival pt is answering questions and following commands appropriately.

## 2019-10-27 NOTE — Progress Notes (Signed)
PHARMACY -  BRIEF ANTIBIOTIC NOTE   Pharmacy has received consult(s) for vancomycin from an ED provider.  The patient's profile has been reviewed for ht/wt/allergies/indication/available labs.    One time order(s) placed for vancomycin 2g IV   Further antibiotics/pharmacy consults should be ordered by admitting physician if indicated.                       Thank you,  Tobie Lords, PharmD, BCPS Clinical Pharmacist 10/27/2019  10:35 PM

## 2019-10-27 NOTE — Progress Notes (Signed)
CODE SEPSIS - PHARMACY COMMUNICATION  **Broad Spectrum Antibiotics should be administered within 1 hour of Sepsis diagnosis**  Time Code Sepsis Called/Page Received: 2213  Antibiotics Ordered: vanc/ceftriaxone  Time of 1st antibiotic administration: 2232  Additional action taken by pharmacy:   If necessary, Name of Provider/Nurse Contacted:     Tobie Lords ,PharmD Clinical Pharmacist  10/27/2019  10:54 PM

## 2019-10-27 NOTE — H&P (Addendum)
[facility] [place] NAME: Becky Trevino    MR#:  [id number]  DATE OF BIRTH:  1946-04-11  DATE OF ADMISSION:  10/27/2019  PRIMARY CARE PHYSICIAN: [name], MD   REQUESTING/REFERRING PHYSICIAN: Blake Divine, MD  CHIEF COMPLAINT:   Chief Complaint  Patient presents with  . Altered Mental Status    HISTORY OF PRESENT ILLNESS:  Becky Trevino  is a 74 y.o. African-American female with a known history of multiple medical problems that will be mentioned below, including type 2 diabetes mellitus, hypertension and dyslipidemia as well as stage III chronic kidney disease, who presented to the emergency room with acute onset of altered mental status per her daughter with associated right lower extremity swelling with warmth and tenderness as well as dyspnea.  She had a recent right ankle ORIF on 09/02/2019.  She admits to chills but denies any fever.  No nausea or vomiting.  She denied any chest pain or cough or wheezing.  She denied any orthopnea or paroxysmal nocturnal dyspnea.  No history of PE or lower extremity DVT.  She is currently on Xarelto that was likely started postoperatively.  She does have history of chronic venous insufficiency and lower extremity varicosities.  Upon presentation to the emergency room, blood pressure was 152/78 with a respirate of 21 and later 31, heart rate of 98 and temperature of 100.  Labs revealed mild hyponatremia and creatinine 1.27 up from 1 2 months ago, lactic acid 1.8 and later 1.7 and CBC showed leukocytosis of 16.7 with neutrophilia. Covid 19 PCR is currently pending.  Blood cultures were drawn.  Noncontrasted head CT scan revealed no acute intracranial abnormalities.  Right lower extremity venous Doppler revealed DVT within the right popliteal vein with superficial thrombus seen at the saphenofemoral junction.  The patient was given IV Rocephin and vancomycin.  She will be admitted to medical monitored bed for further  evaluation and management. PAST MEDICAL HISTORY:   Past Medical History:  Diagnosis Date  . Cervical spondylosis   . Chronic kidney disease    Stage 3  . DDD (degenerative disc disease), cervical    [facility]  . Diabetes mellitus without complication ([place])   . Diverticulosis   . Hyperlipidemia   . Hypertension   . Intertrigo   . Leg cramps   . Leg varices   . Obesity   . OSA (obstructive sleep apnea)   . Symptomatic menopausal or female climacteric states   . Syncope     PAST SURGICAL HISTORY:   Past Surgical History:  Procedure Laterality Date  . ABDOMINAL HYSTERECTOMY  1993   Total  . BUNIONECTOMY Bilateral 1993  . I & D KNEE WITH POLY EXCHANGE Right 11/19/2017   Procedure: RIGHT KNEE POLY EXCHANGE WITH IRRIGATION AND DEBRIDEMENT;  Surgeon: Hessie Knows, MD;  Location: [facility] ORS;  Service: Orthopedics;  Laterality: Right;  . JOINT REPLACEMENT     bilateral knee  . LAMINECTOMY  11/14/2013    Cervical Fusion , [facility], Dr. Delilah Shan  . LASER ABLATION Bilateral 07/29/2012   Dr. Lucky Cowboy  . ORIF ANKLE FRACTURE Right 09/02/2019   Procedure: OPEN REDUCTION INTERNAL FIXATION (ORIF) ANKLE FRACTURE BIMALLEOLAR;  Surgeon: [name], DPM;  Location: [facility] ORS;  Service: Podiatry;  Laterality: Right;  . REPLACEMENT TOTAL KNEE Left 03/2010   Dr. Rudene Christians  . TOTAL HIP ARTHROPLASTY Left 12/11/2015   Procedure: TOTAL HIP ARTHROPLASTY ANTERIOR APPROACH;  Surgeon: Hessie Knows, MD;  Location: [facility] ORS;  Service:  Orthopedics;  Laterality: Left;  . TOTAL KNEE ARTHROPLASTY Right 03/11/2016   Procedure: TOTAL KNEE ARTHROPLASTY;  Surgeon: Hessie Knows, MD;  Location: [facility] ORS;  Service: Orthopedics;  Laterality: Right;    SOCIAL HISTORY:   Social History   Tobacco Use  . Smoking status: Never Smoker  . Smokeless tobacco: Never Used  Substance Use Topics  . Alcohol use: No    Alcohol/week: 0.0 standard drinks    FAMILY HISTORY:   Family History  Problem Relation Age of Onset  .  Diabetes Mother   . Hyperlipidemia Mother   . Hypertension Mother   . Diabetes Father   . Hyperlipidemia Father   . Hypertension Father   . Obesity Father   . Hypertension Sister   . Hyperlipidemia Sister   . Hyperlipidemia Sister   . Breast cancer Neg Hx     DRUG ALLERGIES:   Allergies  Allergen Reactions  . Lisinopril Swelling    REVIEW OF SYSTEMS:   ROS As per history of present illness. All pertinent systems were reviewed above. Constitutional,  HEENT, cardiovascular, respiratory, GI, GU, musculoskeletal, neuro, psychiatric, endocrine,  integumentary and hematologic systems were reviewed and are otherwise  negative/unremarkable except for positive findings mentioned above in the HPI.   MEDICATIONS AT HOME:   Prior to Admission medications   Medication Sig Start Date End Date Taking? Authorizing Provider  acetaminophen (TYLENOL) 650 MG CR tablet Take 650-1,300 mg by mouth 2 (two) times daily as needed for pain.   Yes [provider]  atorvastatin (LIPITOR) 40 MG tablet Take 1 tablet (40 mg total) by mouth daily at 6 PM. Patient taking differently: Take 40 mg by mouth every evening.  05/12/19  Yes Sowles, Drue Stager, MD  Calcium Carbonate-Vitamin D (CALCIUM 600-D) 600-400 MG-UNIT per tablet Take 1 tablet by mouth 2 (two) times daily. 02/01/09  Yes [provider]  doxycycline (VIBRAMYCIN) 100 MG capsule Take 100 mg by mouth 2 (two) times daily. 08/25/19  Yes [provider]  enoxaparin (LOVENOX) 40 MG/0.4ML injection Inject 0.4 mLs (40 mg total) into the skin daily. 09/03/19  Yes [name], DPM  fluticasone (FLONASE) 50 MCG/ACT nasal spray Place 2 sprays into both nostrils daily. 05/21/17  Yes Sowles, Drue Stager, MD  glucose blood (ONE TOUCH ULTRA TEST) test strip Use as instructed 08/27/15  Yes Sowles, Drue Stager, MD  imipramine (TOFRANIL) 25 MG tablet Take 1 tablet (25 mg total) by mouth at bedtime. 05/06/19  Yes [name], Larene Beach A, PA-C  ketoconazole  (NIZORAL) 2 % cream Apply 1 application topically 2 (two) times daily as needed (rash).  01/26/15  Yes [provider]  Magnesium Oxide 500 MG CAPS Take 500 mg by mouth daily.  11/10/07  Yes [provider]  metoprolol succinate (TOPROL-XL) 100 MG 24 hr tablet Take 100 mg by mouth at bedtime. 08/18/19  Yes [provider]  midodrine (PROAMATINE) 5 MG tablet Take 1 tablet (5 mg total) by mouth 3 (three) times daily with meals. Patient taking differently: Take 5 mg by mouth 3 (three) times daily.  08/09/19  Yes Nicole Kindred A, DO  mirabegron ER (MYRBETRIQ) 25 MG TB24 tablet Take 1 tablet (25 mg total) by mouth daily. 06/07/19  Yes [name], Larene Beach A, PA-C  oxyCODONE-acetaminophen (PERCOCET) 7.5-325 MG tablet Take 1 tablet by mouth every 4 (four) hours as needed for severe pain.   Yes [provider]  SIMBRINZA 1-0.2 % SUSP Place 1 drop into both eyes 2 (two) times daily. 10/13/19  Yes  [provider]  celecoxib (CELEBREX) 200 MG capsule Take 200 mg by mouth daily as needed for moderate pain.  07/20/17   [provider]  furosemide (LASIX) 20 MG tablet Take 20 mg by mouth every evening.    [provider]  gabapentin (NEURONTIN) 300 MG capsule Take 1 capsule (300 mg total) by mouth every evening. Patient not taking: Reported on 09/23/2019 11/25/18   [name], MD  Netarsudil-Latanoprost (ROCKLATAN) 0.02-0.005 % SOLN Place 1 drop into both eyes at bedtime.    [provider]  oxycodone (OXY-IR) 5 MG capsule Take 5 mg by mouth every 6 (six) hours as needed for pain.    [provider]  oxyCODONE-acetaminophen (PERCOCET/ROXICET) 5-325 MG tablet Take 1 tablet by mouth as directed. 10/12/19   [provider]  potassium chloride (KLOR-CON) 10 MEQ tablet Take 10 mEq by mouth daily. 08/18/19   [provider]  traZODone (DESYREL) 50 MG tablet TAKE 1 TABLET BY MOUTH AT BEDTIME AS NEEDED FOR SLEEP Patient not taking:  Reported on 10/27/2019 08/29/18   [name], MD  XARELTO 20 MG TABS tablet Take 20 mg by mouth daily. 09/22/19   Callwood, Dwayne D, MD      VITAL SIGNS:  Blood pressure (!) 146/64, pulse 95, temperature 100 F (37.8 C), temperature source Oral, resp. rate 20, height 5\' 11"  (1.803 m), weight 114.8 kg, SpO2 95 %.  PHYSICAL EXAMINATION:  Physical Exam  GENERAL:  74 y.o.-year-old African-American female patient lying in the bed with no acute distress.  EYES: Pupils equal, round, reactive to light and accommodation. No scleral icterus. Extraocular muscles intact.  HEENT: Head atraumatic, normocephalic. Oropharynx and nasopharynx clear.  NECK:  Supple, no jugular venous distention. No thyroid enlargement, no tenderness.  LUNGS: Normal breath sounds bilaterally, no wheezing, rales,rhonchi or crepitation. No use of accessory muscles of respiration.  CARDIOVASCULAR: Regular rate and rhythm, S1, S2 normal. No murmurs, rubs, or gallops.  ABDOMEN: Soft, nondistended, nontender. Bowel sounds present. No organomegaly or mass.  EXTREMITIES: She has right diffuse leg swelling with slightly positive Homans' sign mild erythema and warmth with associated tenderness and right ankle wound VAC with no cyanosis, or clubbing.  NEUROLOGIC: Cranial nerves II through XII are intact. Muscle strength 5/5 in all extremities. Sensation intact. Gait not checked.  PSYCHIATRIC: The patient is alert and oriented x 3.  Normal affect and good eye contact. SKIN: No obvious rash, lesion, or ulcer.   LABORATORY PANEL:   CBC Recent Labs  Lab 10/27/19 2118  WBC 16.7*  HGB 12.9  HCT 40.9  PLT 131*   ------------------------------------------------------------------------------------------------------------------  Chemistries  Recent Labs  Lab 10/27/19 2118  NA 134*  K 4.0  CL 100  CO2 23  GLUCOSE 121*  BUN 18  CREATININE 1.27*  CALCIUM 9.6  AST 19  ALT 16  ALKPHOS 121  BILITOT 0.8    ------------------------------------------------------------------------------------------------------------------  Cardiac Enzymes No results for input(s): TROPONINI in the last 168 hours. ------------------------------------------------------------------------------------------------------------------  RADIOLOGY:  CT Head Wo Contrast  Result Date: 10/27/2019 CLINICAL DATA:  Encephalopathy EXAM: CT HEAD WITHOUT CONTRAST TECHNIQUE: Contiguous axial images were obtained from the base of the skull through the vertex without intravenous contrast. COMPARISON:  Head CT 08/07/2019 FINDINGS: Brain: There is no mass, hemorrhage or extra-axial collection. The size and configuration of the ventricles and extra-axial CSF spaces are normal. The brain parenchyma is normal, without acute or chronic infarction. Vascular: No abnormal hyperdensity of the major intracranial arteries or dural venous sinuses.  No intracranial atherosclerosis. Skull: Craniocervical fusion. Sinuses/Orbits: No fluid levels or advanced mucosal thickening of the visualized paranasal sinuses. No mastoid or middle ear effusion. The orbits are normal. IMPRESSION: Normal brain. Electronically Signed   By: Ulyses Jarred M.D.   On: 10/27/2019 22:18   US Venous Img Lower Unilateral Right  Result Date: 10/27/2019 CLINICAL DATA:  Erythema and edema EXAM: Right LOWER EXTREMITY VENOUS DOPPLER ULTRASOUND TECHNIQUE: Gray-scale sonography with graded compression, as well as color Doppler and duplex ultrasound were performed to evaluate the lower extremity deep venous systems from the level of the common femoral vein and including the common femoral, femoral, profunda femoral, popliteal and calf veins including the posterior tibial, peroneal and gastrocnemius veins when visible. The superficial great saphenous vein was also interrogated. Spectral Doppler was utilized to evaluate flow at rest and with distal augmentation maneuvers in the common femoral,  femoral and popliteal veins. COMPARISON:  None FINDINGS: Contralateral Common Femoral Vein: Respiratory phasicity is normal and symmetric with the symptomatic side. No evidence of thrombus. Normal compressibility. Common Femoral Vein: No evidence of thrombus. Normal compressibility, respiratory phasicity and response to augmentation. Saphenofemoral Junction: There is occlusive thrombus at the junction of the saphenous vein with the common femoral vein. Profunda Femoral Vein: No evidence of thrombus. Normal compressibility and flow on color Doppler imaging. Femoral Vein: No evidence of thrombus. Normal compressibility, respiratory phasicity and response to augmentation. Popliteal Vein: There is occlusive thrombus within the right popliteal vein. Calf Veins: No evidence of thrombus. Normal compressibility and flow on color Doppler imaging. Superficial Great Saphenous Vein: No evidence of thrombus. Normal compressibility. Venous Reflux:  None. Other Findings:  None. IMPRESSION: 1. Deep venous thrombus within the right popliteal vein, with superficial thrombus seen at the saphenofemoral junction. These results will be called to the ordering clinician or representative by the Radiologist Assistant, and communication documented in the PACS or zVision Dashboard. Electronically Signed   By: Randa Ngo M.D.   On: 10/27/2019 23:04   DG Chest Port 1 View  Result Date: 10/27/2019 CLINICAL DATA:  Altered level of consciousness, sepsis EXAM: PORTABLE CHEST 1 VIEW COMPARISON:  08/07/2019 FINDINGS: Single frontal view of the chest was performed. Lung volumes are diminished, with crowding of the pulmonary vasculature. No definite airspace disease, effusion, or pneumothorax. The cardiac silhouette is stable. IMPRESSION: 1. Low lung volumes with crowding of the pulmonary vasculature. 2. No acute airspace disease. Electronically Signed   By: Randa Ngo M.D.   On: 10/27/2019 22:40      IMPRESSION AND PLAN:  1.  Right  lower extremity DVT.  The patient could have failed p.o. Xarelto.  Will place her on therapeutic Lovenox for now and continue Xarelto.  A hematology consult will be obtained by Dr. Dennis Bast who was notified about the patient.  2.  Moderate nonpurulent right lower extremity cellulitis with subsequent mild sepsis without severe sepsis or septic shock.  She will be placed on 2 g of IV Rocephin daily.  Pain management will be provided.  The patient has altered mental status like secondary to his sepsis.  3.  Mild acute kidney injury superimposed on stage IIIa chronic kidney disease.  This is likely secondary to volume depletion and mild dehydration.  She will be hydrated with IV normal saline and will follow BMP.  4.  Type 2 diabetes mellitus.  She will be placed on supplemental coverage with NovoLog.  5.  Dyslipidemia.  We will continue statin therapy.  6.  DVT prophylaxis.  She will be on therapeutic Lovenox.  All the records are reviewed and case discussed with ED provider. The plan of care was discussed in details with the patient (and family). I answered all questions. The patient agreed to proceed with the above mentioned plan. Further management will depend upon hospital course.   CODE STATUS: Full code  TOTAL TIME TAKING CARE OF THIS PATIENT: 55 minutes.    Christel Mormon M.D on 10/27/2019 at 11:17 PM  [facility]   From 7 PM-7 AM, contact night-coverage [email or web address]  CC: Primary care physician; [name], MD   Note: This dictation was prepared with Dragon dictation along with smaller phrase technology. Any transcriptional errors that result from this process are unintentional.

## 2019-10-28 ENCOUNTER — Inpatient Hospital Stay: Payer: BC Managed Care – PPO

## 2019-10-28 DIAGNOSIS — A419 Sepsis, unspecified organism: Secondary | ICD-10-CM

## 2019-10-28 DIAGNOSIS — Z79899 Other long term (current) drug therapy: Secondary | ICD-10-CM

## 2019-10-28 DIAGNOSIS — W19XXXD Unspecified fall, subsequent encounter: Secondary | ICD-10-CM

## 2019-10-28 DIAGNOSIS — R4182 Altered mental status, unspecified: Secondary | ICD-10-CM

## 2019-10-28 DIAGNOSIS — L0390 Cellulitis, unspecified: Secondary | ICD-10-CM

## 2019-10-28 DIAGNOSIS — E119 Type 2 diabetes mellitus without complications: Secondary | ICD-10-CM

## 2019-10-28 DIAGNOSIS — E785 Hyperlipidemia, unspecified: Secondary | ICD-10-CM

## 2019-10-28 DIAGNOSIS — I10 Essential (primary) hypertension: Secondary | ICD-10-CM

## 2019-10-28 DIAGNOSIS — M5030 Other cervical disc degeneration, unspecified cervical region: Secondary | ICD-10-CM

## 2019-10-28 DIAGNOSIS — B954 Other streptococcus as the cause of diseases classified elsewhere: Secondary | ICD-10-CM

## 2019-10-28 DIAGNOSIS — Z8739 Personal history of other diseases of the musculoskeletal system and connective tissue: Secondary | ICD-10-CM

## 2019-10-28 DIAGNOSIS — S8251XD Displaced fracture of medial malleolus of right tibia, subsequent encounter for closed fracture with routine healing: Secondary | ICD-10-CM

## 2019-10-28 DIAGNOSIS — L03115 Cellulitis of right lower limb: Secondary | ICD-10-CM

## 2019-10-28 DIAGNOSIS — S82451D Displaced comminuted fracture of shaft of right fibula, subsequent encounter for closed fracture with routine healing: Secondary | ICD-10-CM

## 2019-10-28 DIAGNOSIS — Z792 Long term (current) use of antibiotics: Secondary | ICD-10-CM

## 2019-10-28 DIAGNOSIS — N179 Acute kidney failure, unspecified: Secondary | ICD-10-CM

## 2019-10-28 DIAGNOSIS — R42 Dizziness and giddiness: Secondary | ICD-10-CM

## 2019-10-28 LAB — BLOOD CULTURE ID PANEL (REFLEXED)

## 2019-10-28 LAB — URINALYSIS, COMPLETE (UACMP) WITH MICROSCOPIC
Bilirubin Urine: NEGATIVE
Glucose, UA: NEGATIVE mg/dL
Ketones, ur: NEGATIVE mg/dL
Leukocytes,Ua: NEGATIVE
Nitrite: NEGATIVE
Protein, ur: 30 mg/dL — AB
Specific Gravity, Urine: 1.019 (ref 1.005–1.030)
pH: 5 (ref 5.0–8.0)

## 2019-10-28 LAB — GLUCOSE, CAPILLARY
Glucose-Capillary: 73 mg/dL (ref 70–99)
Glucose-Capillary: 80 mg/dL (ref 70–99)
Glucose-Capillary: 72 mg/dL (ref 70–99)
Glucose-Capillary: 74 mg/dL (ref 70–99)

## 2019-10-28 LAB — APTT: aPTT: 28 seconds (ref 24–36)

## 2019-10-28 LAB — SARS CORONAVIRUS 2 (TAT 6-24 HRS): SARS Coronavirus 2: NEGATIVE

## 2019-10-28 LAB — HEMOGLOBIN A1C
Hgb A1c MFr Bld: 5.5 % (ref 4.8–5.6)
Mean Plasma Glucose: 111.15 mg/dL

## 2019-10-28 LAB — PROTIME-INR
INR: 1.2 (ref 0.8–1.2)
Prothrombin Time: 15.4 seconds — ABNORMAL HIGH (ref 11.4–15.2)

## 2019-10-28 MED ORDER — CALCIUM CARBONATE-VITAMIN D 500-200 MG-UNIT PO TABS
1.0000 | ORAL_TABLET | Freq: Two times a day (BID) | ORAL | Status: DC
  Administered 2019-10-28 – 2019-11-07 (×21): 1 via ORAL
  Filled 2019-10-28 (×21): qty 1

## 2019-10-28 MED ORDER — MAGNESIUM HYDROXIDE 400 MG/5ML PO SUSP: 30.0000 mL | Freq: Every day | ORAL | Status: DC | PRN

## 2019-10-28 MED ORDER — POTASSIUM CHLORIDE ER 10 MEQ PO TBCR: 10.0000 meq | EXTENDED_RELEASE_TABLET | Freq: Every day | ORAL | Status: DC

## 2019-10-28 MED ORDER — ATORVASTATIN CALCIUM 20 MG PO TABS
40.0000 mg | ORAL_TABLET | Freq: Every evening | ORAL | Status: DC
  Administered 2019-10-28 – 2019-10-31 (×4): 40 mg via ORAL
  Filled 2019-10-28 (×4): qty 2

## 2019-10-28 MED ORDER — MAGNESIUM OXIDE 400 (241.3 MG) MG PO TABS
400.0000 mg | ORAL_TABLET | Freq: Every day | ORAL | Status: DC
  Administered 2019-10-28 – 2019-11-07 (×11): 400 mg via ORAL
  Filled 2019-10-28 (×11): qty 1

## 2019-10-28 MED ORDER — NETARSUDIL-LATANOPROST 0.02-0.005 % OP SOLN: 1.0000 [drp] | Freq: Every day | OPHTHALMIC | Status: DC

## 2019-10-28 MED ORDER — OXYCODONE HCL 5 MG PO CAPS: 5.0000 mg | ORAL_CAPSULE | Freq: Four times a day (QID) | ORAL | Status: DC | PRN

## 2019-10-28 MED ORDER — ONDANSETRON HCL 4 MG PO TABS: 4.0000 mg | ORAL_TABLET | Freq: Four times a day (QID) | ORAL | Status: DC | PRN

## 2019-10-28 MED ORDER — SODIUM CHLORIDE 0.9 % IV SOLN: INTRAVENOUS | Status: DC

## 2019-10-28 MED ORDER — ENOXAPARIN SODIUM 120 MG/0.8ML ~~LOC~~ SOLN
115.0000 mg | Freq: Two times a day (BID) | SUBCUTANEOUS | Status: DC
  Administered 2019-10-28 – 2019-10-31 (×8): 115 mg via SUBCUTANEOUS
  Filled 2019-10-28 (×9): qty 0.8

## 2019-10-28 MED ORDER — RIVAROXABAN 20 MG PO TABS: 20.0000 mg | ORAL_TABLET | Freq: Every day | ORAL | Status: DC

## 2019-10-28 MED ORDER — MIRABEGRON ER 25 MG PO TB24
25.0000 mg | ORAL_TABLET | Freq: Every day | ORAL | Status: DC
  Administered 2019-10-28 – 2019-11-07 (×11): 25 mg via ORAL
  Filled 2019-10-28 (×11): qty 1

## 2019-10-28 MED ORDER — OXYCODONE-ACETAMINOPHEN 5-325 MG PO TABS: 1.0000 | ORAL_TABLET | ORAL | Status: DC

## 2019-10-28 MED ORDER — ACETAMINOPHEN 325 MG PO TABS
650.0000 mg | ORAL_TABLET | Freq: Four times a day (QID) | ORAL | Status: DC | PRN
  Administered 2019-10-28 – 2019-11-02 (×4): 650 mg via ORAL
  Filled 2019-10-28 (×5): qty 2

## 2019-10-28 MED ORDER — ACETAMINOPHEN 650 MG RE SUPP: 650.0000 mg | Freq: Four times a day (QID) | RECTAL | Status: DC | PRN

## 2019-10-28 MED ORDER — CELECOXIB 200 MG PO CAPS: 200.0000 mg | ORAL_CAPSULE | Freq: Every day | ORAL | Status: DC | PRN

## 2019-10-28 MED ORDER — INSULIN ASPART 100 UNIT/ML ~~LOC~~ SOLN
3.0000 [IU] | Freq: Three times a day (TID) | SUBCUTANEOUS | Status: DC
  Filled 2019-10-28: qty 1

## 2019-10-28 MED ORDER — INSULIN ASPART 100 UNIT/ML ~~LOC~~ SOLN
0.0000 [IU] | Freq: Three times a day (TID) | SUBCUTANEOUS | Status: DC
  Filled 2019-10-28: qty 1

## 2019-10-28 MED ORDER — FLUTICASONE PROPIONATE 50 MCG/ACT NA SUSP
2.0000 | Freq: Every day | NASAL | Status: DC
  Administered 2019-10-28 – 2019-11-07 (×11): 2 via NASAL
  Filled 2019-10-28: qty 16

## 2019-10-28 MED ORDER — TRAZODONE HCL 50 MG PO TABS: 25.0000 mg | ORAL_TABLET | Freq: Every evening | ORAL | Status: DC | PRN

## 2019-10-28 MED ORDER — ENOXAPARIN SODIUM 40 MG/0.4ML ~~LOC~~ SOLN: 40.0000 mg | SUBCUTANEOUS | Status: DC

## 2019-10-28 MED ORDER — METOPROLOL SUCCINATE ER 100 MG PO TB24
100.0000 mg | ORAL_TABLET | Freq: Every day | ORAL | Status: DC
  Administered 2019-10-28 – 2019-11-06 (×10): 100 mg via ORAL
  Filled 2019-10-28 (×10): qty 1

## 2019-10-28 MED ORDER — MIDODRINE HCL 5 MG PO TABS
5.0000 mg | ORAL_TABLET | Freq: Three times a day (TID) | ORAL | Status: DC
  Administered 2019-10-28 – 2019-11-07 (×31): 5 mg via ORAL
  Filled 2019-10-28 (×34): qty 1

## 2019-10-28 MED ORDER — SODIUM CHLORIDE 0.9 % IV SOLN
2.0000 g | INTRAVENOUS | Status: DC
  Filled 2019-10-28: qty 20

## 2019-10-28 MED ORDER — ONDANSETRON HCL 4 MG/2ML IJ SOLN: 4.0000 mg | Freq: Four times a day (QID) | INTRAMUSCULAR | Status: DC | PRN

## 2019-10-28 MED ORDER — FUROSEMIDE 20 MG PO TABS: 20.0000 mg | ORAL_TABLET | Freq: Every evening | ORAL | Status: DC

## 2019-10-28 MED ORDER — MORPHINE SULFATE (PF) 2 MG/ML IV SOLN: 2.0000 mg | INTRAVENOUS | Status: DC | PRN

## 2019-10-28 MED ORDER — SODIUM CHLORIDE 0.9 % IV SOLN
3.0000 g | Freq: Four times a day (QID) | INTRAVENOUS | Status: DC
  Administered 2019-10-28 – 2019-11-07 (×40): 3 g via INTRAVENOUS
  Filled 2019-10-28: qty 3
  Filled 2019-10-28: qty 8
  Filled 2019-10-28 (×2): qty 3
  Filled 2019-10-28: qty 8
  Filled 2019-10-28 (×3): qty 3
  Filled 2019-10-28 (×3): qty 8
  Filled 2019-10-28: qty 3
  Filled 2019-10-28: qty 8
  Filled 2019-10-28: qty 3
  Filled 2019-10-28: qty 8
  Filled 2019-10-28: qty 3
  Filled 2019-10-28: qty 8
  Filled 2019-10-28: qty 3
  Filled 2019-10-28 (×5): qty 8
  Filled 2019-10-28 (×2): qty 3
  Filled 2019-10-28: qty 8
  Filled 2019-10-28: qty 3
  Filled 2019-10-28 (×7): qty 8
  Filled 2019-10-28 (×3): qty 3
  Filled 2019-10-28: qty 8
  Filled 2019-10-28: qty 3
  Filled 2019-10-28: qty 8
  Filled 2019-10-28 (×2): qty 3
  Filled 2019-10-28 (×3): qty 8

## 2019-10-28 NOTE — Progress Notes (Signed)
ANTICOAGULATION CONSULT NOTE - Initial Consult  Pharmacy Consult for lovenox Indication: DVT  Allergies  Allergen Reactions  . Lisinopril Swelling    Patient Measurements: Height: 5\' 11"  (180.3 cm) Weight: 256 lb 2.8 oz (116.2 kg) IBW/kg (Calculated) : 70.8  Vital Signs: Temp: 99.3 F (37.4 C) (02/12 0354) Temp Source: Oral (02/12 0354) BP: 125/64 (02/12 0354) Pulse Rate: 89 (02/12 0354)  Labs: Recent Labs    10/27/19 2118  HGB 12.9  HCT 40.9  PLT 131*  CREATININE 1.27*    Estimated Creatinine Clearance: 54.6 mL/min (A) (by C-G formula based on SCr of 1.27 mg/dL (H)).   Medical History: Past Medical History:  Diagnosis Date  . Cervical spondylosis   . Chronic kidney disease    Stage 3  . DDD (degenerative disc disease), cervical    [facility]  . Diabetes mellitus without complication ([place])   . Diverticulosis   . Hyperlipidemia   . Hypertension   . Intertrigo   . Leg cramps   . Leg varices   . Obesity   . OSA (obstructive sleep apnea)   . Symptomatic menopausal or female climacteric states   . Syncope     Medications:  Scheduled:  . atorvastatin  40 mg Oral QPM  . calcium-vitamin D  1 tablet Oral BID  . enoxaparin (LOVENOX) injection  115 mg Subcutaneous Q12H  . fluticasone  2 spray Each Nare Daily  . insulin aspart  0-15 Units Subcutaneous TID PC & HS  . insulin aspart  3 Units Subcutaneous TID WC  . magnesium oxide  400 mg Oral Daily  . metoprolol succinate  100 mg Oral QHS  . midodrine  5 mg Oral TID  . mirabegron ER  25 mg Oral Daily    Assessment: Admitted for AMS, had an ORIF on 12/18 was previous on xarelto possibly post-op for VTE prophylaxis, but then switch to daily VTE prophylactic lovenox since she has a h/o CKD (baseline Scr 1.1 - 1.3, currently 1.27 around baseline. On arrival shown to have DVT in popliteal vein on [place]. Baseline CBC WNL, aPTT, INR pending, last dose of lovenox 40 mg subq was yesterday 02/11 at unknown time  according to med rec. Patient is being started on full dose lovenox for anticoagulation of DVT.  Goal of Therapy:  Monitor platelets by anticoagulation protocol: Yes   Plan:  Will start patient on lovenox 115 mg subq q12h. Will continue to monitor daily CBC's and BMP's to ensure renal function is adequate for therapy. Will also monitor for s/sx of bleeding.  Tobie Lords, PharmD, BCPS Clinical Pharmacist 10/28/2019,6:16 AM

## 2019-10-28 NOTE — Consult Note (Addendum)
Hematology/Oncology Consult note [facility] Telephone:([phone] [phone]  Patient Care Team: [name], MD as PCP - General (Family Medicine) Yolonda Kida, MD as Consulting Physician (Cardiology) Albertine Patricia, DPM as Consulting Physician (Podiatry) Lorelee Cover., MD as Consulting Physician (Ophthalmology) Hessie Knows, MD as Consulting Physician (Orthopedic Surgery) Murrell Redden, MD as Consulting Physician (Urology) Cira Servant, DO as Consulting Physician (Rehabilitation) [name], MD as Consulting Physician (Infectious Diseases) Neldon Labella, RN as Case Manager   Name of the patient: Becky Trevino  [id number]  25-Nov-1945   Date of visit: 10/28/19 REASON FOR COSULTATION:  DVT History of presenting illness-  75 y.o. female with PMH listed at below who was sent to ER via EMS for altered mental status. Family noticed that she seemed increasingly confused starting yesterday afternoon.  She was typically alert and orientated with no difficulties caring herself.  EMS did not notice any focal deficit during the transport.  Patient states that her right lower extremity hurts.  No reports of fever, headache, focal weakness.  Some chills. Extensive medical records review of  our current EMR as well as [facility] health system  performed by me. Patient had ankle fracture status post a recent right ankle ORIF on 09/02/2019 by Dr. Araceli Bouche.  Patient has a wound VAC She was noticed to have right lower extremity swelling, mild erythema and warmth with associated tenderness. 11/24/2019 US venous right lower extremity showed a DVT within the right popliteal vein, with superficial thrombus seen at the saphenofemoral junction. Patient tells me that she was diagnosed with "blood clots" in August 2020.  At that time she had pain and swelling for about a week and went to ER for evaluation.    04/29/2019 ultrasound was negative  for DVT, only showed a superficial thrombophlebitis involving a branch of the great saphenous vein in the proximal thigh.  She was fine to have right Baker's cyst.  Patient denies any immobilization factors contributing to this event.  Patient had elevated D-dimer at that time. Patient was started on Xarelto-starter kit by emergency room.  Patient was recommended to have US aorta iliac Doppler by primary care provider Dr. Ancil Boozer and I do not see the study was done.  Xarelto was continued outpatient for thrombophlebitis..  Patient was hospitalized again at the end of November 2020 due to syncope episodes which likely due to orthostatic hypotension.  Patient was noted to have anemia, CT abdomen pelvis to evaluate after trauma, showed left gluteal hematoma without signs of extravasation.  Xarelto was held during the hospitalization.  Saw cardiology [place].  Patient was seen by Dr. Ancil Boozer on 08/23/2019 and the Xarelto was stopped.  09/02/2019 patient slipped and obtained right ankle fracture.  Status post ORIF procedure by Dr. Luana Shu.  Postop patient was recommended to start on Lovenox 40 mg daily injections prophylactically until current presentation.  Today patient denies any shortness of breath, chest pain, hemoptysis. Patient is on IV antibiotics for right lower extremity cellulitis.  During our encounter, I also met Dr. Luana Shu who came to evaluate her ankle wound.  Dr. Luana Shu confirmed that patient has been on prophylactic Lovenox injections since her procedure in December. Since admission, patient was placed on therapeutic dose of Lovenox 1 mg/kg twice daily   Review of Systems  Constitutional: Negative for appetite change, chills, fatigue and fever.  HENT:   Negative for hearing loss and voice change.   Eyes: Negative for eye problems.  Respiratory: Negative for chest  tightness and cough.   Cardiovascular: Negative for chest pain.  Gastrointestinal: Negative for abdominal distention, abdominal  pain and blood in stool.  Endocrine: Negative for hot flashes.  Genitourinary: Negative for difficulty urinating and frequency.   Musculoskeletal: Negative for arthralgias.       Right ankle pain  Skin: Negative for itching and rash.  Neurological: Negative for extremity weakness.  Hematological: Negative for adenopathy.  Psychiatric/Behavioral: Negative for confusion.    Allergies  Allergen Reactions  . Lisinopril Swelling    Patient Active Problem List   Diagnosis Date Noted  . Cellulitis of right leg 10/27/2019  . Hematoma   . Syncope 08/07/2019  . Anemia 08/07/2019  . Chronic infection of right knee ([place]) 11/25/2018  . Infection and inflammatory reaction due to internal joint prosthesis, subsequent encounter 01/25/2018  . Infection of total right knee replacement ([place]) 11/19/2017  . Type 2 diabetes mellitus with diabetic neuropathy, without long-term current use of insulin ([place]) 11/06/2016  . Primary osteoarthritis of knee 03/11/2016  . Primary osteoarthritis of left hip 12/11/2015  . Benign essential HTN 04/21/2015  . Edema leg 04/21/2015  . Chronic kidney disease (CKD), stage III (moderate) 04/21/2015  . Chronic venous insufficiency 04/21/2015  . DDD (degenerative disc disease), cervical 04/21/2015  . Diabetes mellitus with renal manifestation ([place]) 04/21/2015  . Dyslipidemia 04/21/2015  . Bladder cystocele 04/21/2015  . Urinary incontinence 04/21/2015  . Leg varices 04/21/2015  . Insomnia 04/21/2015  . Eczema intertrigo 04/21/2015  . Obstructive apnea 04/21/2015  . Menopausal and perimenopausal disorder 04/21/2015  . Supraventricular premature beats 04/21/2015  . Osteoarthritis of multiple joints 04/21/2015  . H/O Spinal surgery 11/14/2013  . Detrusor muscle hypertonia 11/07/2013     Past Medical History:  Diagnosis Date  . Cervical spondylosis   . Chronic kidney disease    Stage 3  . DDD (degenerative disc disease), cervical    [facility]  .  Diabetes mellitus without complication ([place])   . Diverticulosis   . Hyperlipidemia   . Hypertension   . Intertrigo   . Leg cramps   . Leg varices   . Obesity   . OSA (obstructive sleep apnea)   . Symptomatic menopausal or female climacteric states   . Syncope      Past Surgical History:  Procedure Laterality Date  . ABDOMINAL HYSTERECTOMY  1993   Total  . BUNIONECTOMY Bilateral 1993  . I & D KNEE WITH POLY EXCHANGE Right 11/19/2017   Procedure: RIGHT KNEE POLY EXCHANGE WITH IRRIGATION AND DEBRIDEMENT;  Surgeon: Hessie Knows, MD;  Location: [facility] ORS;  Service: Orthopedics;  Laterality: Right;  . JOINT REPLACEMENT     bilateral knee  . LAMINECTOMY  11/14/2013    Cervical Fusion , [facility], Dr. Delilah Shan  . LASER ABLATION Bilateral 07/29/2012   Dr. Lucky Cowboy  . ORIF ANKLE FRACTURE Right 09/02/2019   Procedure: OPEN REDUCTION INTERNAL FIXATION (ORIF) ANKLE FRACTURE BIMALLEOLAR;  Surgeon: [name], DPM;  Location: [facility] ORS;  Service: Podiatry;  Laterality: Right;  . REPLACEMENT TOTAL KNEE Left 03/2010   Dr. Rudene Christians  . TOTAL HIP ARTHROPLASTY Left 12/11/2015   Procedure: TOTAL HIP ARTHROPLASTY ANTERIOR APPROACH;  Surgeon: Hessie Knows, MD;  Location: [facility] ORS;  Service: Orthopedics;  Laterality: Left;  . TOTAL KNEE ARTHROPLASTY Right 03/11/2016   Procedure: TOTAL KNEE ARTHROPLASTY;  Surgeon: Hessie Knows, MD;  Location: [facility] ORS;  Service: Orthopedics;  Laterality: Right;    Social History   Socioeconomic History  . Marital status: Married  Spouse name: Not on file  . Number of children: 1  . Years of education: Not on file  . Highest education level: Not on file  Occupational History  . Not on file  Tobacco Use  . Smoking status: Never Smoker  . Smokeless tobacco: Never Used  Substance and Sexual Activity  . Alcohol use: No    Alcohol/week: 0.0 standard drinks  . Drug use: No  . Sexual activity: Yes    Partners: Male  Other Topics Concern  . Not on file  Social History Narrative     Married, one adopted grown child    Social Determinants of Health   Financial Resource Strain: Low Risk   . Difficulty of Paying Living Expenses: Not hard at all  Food Insecurity: No Food Insecurity  . Worried About Charity fundraiser in the Last Year: Never true  . Ran Out of Food in the Last Year: Never true  Transportation Needs: No Transportation Needs  . Lack of Transportation (Medical): No  . Lack of Transportation (Non-Medical): No  Physical Activity: Insufficiently Active  . Days of Exercise per Week: 3 days  . Minutes of Exercise per Session: 30 min  Stress: No Stress Concern Present  . Feeling of Stress : Not at all  Social Connections: Slightly Isolated  . Frequency of Communication with Friends and Family: More than three times a week  . Frequency of Social Gatherings with Friends and Family: More than three times a week  . Attends Religious Services: More than 4 times per year  . Active Member of Clubs or Organizations: No  . Attends Archivist Meetings: Never  . Marital Status: Married  Human resources officer Violence: Not At Risk  . Fear of Current or Ex-Partner: No  . Emotionally Abused: No  . Physically Abused: No  . Sexually Abused: No     Family History  Problem Relation Age of Onset  . Diabetes Mother   . Hyperlipidemia Mother   . Hypertension Mother   . Diabetes Father   . Hyperlipidemia Father   . Hypertension Father   . Obesity Father   . Hypertension Sister   . Hyperlipidemia Sister   . Hyperlipidemia Sister   . Breast cancer Neg Hx      Current Facility-Administered Medications:  .  0.9 %  sodium chloride infusion, , Intravenous, Continuous, Mansy, Jan A, MD, Last Rate: 100 mL/hr at 10/28/19 0726, New Bag at 10/28/19 0726 .  acetaminophen (TYLENOL) tablet 650 mg, 650 mg, Oral, Q6H PRN, 650 mg at 10/28/19 0932 **OR** acetaminophen (TYLENOL) suppository 650 mg, 650 mg, Rectal, Q6H PRN, Mansy, Jan A, MD .  Ampicillin-Sulbactam (UNASYN) 3  g in sodium chloride 0.9 % 100 mL IVPB, 3 g, Intravenous, Q6H, Dallie Piles, RPH, Last Rate: 200 mL/hr at 10/28/19 1233, 3 g at 10/28/19 1233 .  atorvastatin (LIPITOR) tablet 40 mg, 40 mg, Oral, QPM, Mansy, Jan A, MD .  calcium-vitamin D (OSCAL WITH D) 500-200 MG-UNIT per tablet 1 tablet, 1 tablet, Oral, BID, Mansy, Jan A, MD, 1 tablet at 10/28/19 0932 .  enoxaparin (LOVENOX) injection 115 mg, 115 mg, Subcutaneous, Q12H, Mansy, Jan A, MD, 115 mg at 10/28/19 0900 .  fluticasone (FLONASE) 50 MCG/ACT nasal spray 2 spray, 2 spray, Each Nare, Daily, Mansy, Jan A, MD, 2 spray at 10/28/19 0933 .  insulin aspart (novoLOG) injection 0-15 Units, 0-15 Units, Subcutaneous, TID PC & HS, Mansy, Jan A, MD .  insulin aspart (novoLOG) injection  3 Units, 3 Units, Subcutaneous, TID WC, Mansy, Jan A, MD .  magnesium hydroxide (MILK OF MAGNESIA) suspension 30 mL, 30 mL, Oral, Daily PRN, Mansy, Jan A, MD .  magnesium oxide (MAG-OX) tablet 400 mg, 400 mg, Oral, Daily, Mansy, Jan A, MD, 400 mg at 10/28/19 0932 .  metoprolol succinate (TOPROL-XL) 24 hr tablet 100 mg, 100 mg, Oral, QHS, Mansy, Jan A, MD .  midodrine (PROAMATINE) tablet 5 mg, 5 mg, Oral, TID PC, Mansy, Jan A, MD, 5 mg at 10/28/19 1231 .  mirabegron ER (MYRBETRIQ) tablet 25 mg, 25 mg, Oral, Daily, Mansy, Jan A, MD, 25 mg at 10/28/19 0933 .  morphine 2 MG/ML injection 2 mg, 2 mg, Intravenous, Q4H PRN, Mansy, Jan A, MD .  ondansetron (ZOFRAN) tablet 4 mg, 4 mg, Oral, Q6H PRN **OR** ondansetron (ZOFRAN) injection 4 mg, 4 mg, Intravenous, Q6H PRN, Mansy, Jan A, MD .  traZODone (DESYREL) tablet 25 mg, 25 mg, Oral, QHS PRN, Mansy, Arvella Merles, MD   Physical exam: Vitals:   10/28/19 0048 10/28/19 0053 10/28/19 0354 10/28/19 1135  BP: 122/68  125/64 (!) 101/59  Pulse: 91  89 75  Resp: _0 Temp: 99.8 F (37.7 C)  99.3 F (37.4 C) 98.4 F (36.9 C)  TempSrc: Oral  Oral Oral  SpO2: 97%  99% 98%  Weight:  256 lb 2.8 oz (116.2 kg)    Height:  _1  (1.803  m)     Physical Exam  Constitutional: No distress.  HENT:  Head: Normocephalic and atraumatic.  Mouth/Throat: No oropharyngeal exudate.  Eyes: Pupils are equal, round, and reactive to light. EOM are normal. No scleral icterus.  Cardiovascular: Normal rate and regular rhythm.  No murmur heard. Pulmonary/Chest: Effort normal and breath sounds normal. No respiratory distress.  Abdominal: Soft.  Musculoskeletal:        General: No edema. Normal range of motion.     Cervical back: Normal range of motion and neck supple.     Comments: Right lower extremity mild erythema and swelling.  +Right ankle wound.   Neurological: She is alert.  Skin: Skin is warm and dry.  Psychiatric: Affect normal.        CMP Latest Ref Rng & Units 10/27/2019  Glucose 70 - 99 mg/dL 121(H)  BUN 8 - 23 mg/dL 18  Creatinine 0.44 - 1.00 mg/dL 1.27(H)  Sodium 135 - 145 mmol/L 134(L)  Potassium 3.5 - 5.1 mmol/L 4.0  Chloride 98 - 111 mmol/L 100  CO2 22 - 32 mmol/L 23  Calcium 8.9 - 10.3 mg/dL 9.6  Total Protein 6.5 - 8.1 g/dL 8.5(H)  Total Bilirubin 0.3 - 1.2 mg/dL 0.8  Alkaline Phos 38 - 126 U/L 121  AST 15 - 41 U/L 19  ALT 0 - 44 U/L 16   CBC Latest Ref Rng & Units 10/27/2019  WBC 4.0 - 10.5 K/uL 16.7(H)  Hemoglobin 12.0 - 15.0 g/dL 12.9  Hematocrit 36.0 - 46.0 % 40.9  Platelets 150 - 400 K/uL 131(L)   RADIOGRAPHIC STUDIES: I have personally reviewed the radiological images as listed and agreed with the findings in the report. DG Ankle 2 Views Right  Result Date: 10/28/2019 CLINICAL DATA:  The patient suffered a distal fibular fracture due to a fall in December, 2020. The patient underwent ORIF 09/02/2019 and now has developed swelling, warmth and tenderness about the surgical site. EXAM: RIGHT ANKLE - 2 VIEW COMPARISON:  Intraoperative imaging for fracture fixation 09/02/2019. FINDINGS: Plate  and screw fixation of a distal fibular fracture is identified. Position and alignment are anatomic. Subtle  lucency about fixation screws is most notable about the 3rd-6th screws from the top and worrisome for infection or loosening. Soft tissues about the ankle are swollen. No soft tissue gas is identified. Vac drain is in place. IMPRESSION: Status post fixation of a distal fibular fracture. There is subtle lucency about multiple fixation screws worrisome for loosening or infection. Soft tissue swelling about the ankle without soft tissue gas. Electronically Signed   By: Inge Rise M.D.   On: 10/28/2019 09:29   CT Head Wo Contrast  Result Date: 10/27/2019 CLINICAL DATA:  Encephalopathy EXAM: CT HEAD WITHOUT CONTRAST TECHNIQUE: Contiguous axial images were obtained from the base of the skull through the vertex without intravenous contrast. COMPARISON:  Head CT 08/07/2019 FINDINGS: Brain: There is no mass, hemorrhage or extra-axial collection. The size and configuration of the ventricles and extra-axial CSF spaces are normal. The brain parenchyma is normal, without acute or chronic infarction. Vascular: No abnormal hyperdensity of the major intracranial arteries or dural venous sinuses. No intracranial atherosclerosis. Skull: Craniocervical fusion. Sinuses/Orbits: No fluid levels or advanced mucosal thickening of the visualized paranasal sinuses. No mastoid or middle ear effusion. The orbits are normal. IMPRESSION: Normal brain. Electronically Signed   By: Ulyses Jarred M.D.   On: 10/27/2019 22:18   US Venous Img Lower Unilateral Right  Result Date: 10/27/2019 CLINICAL DATA:  Erythema and edema EXAM: Right LOWER EXTREMITY VENOUS DOPPLER ULTRASOUND TECHNIQUE: Gray-scale sonography with graded compression, as well as color Doppler and duplex ultrasound were performed to evaluate the lower extremity deep venous systems from the level of the common femoral vein and including the common femoral, femoral, profunda femoral, popliteal and calf veins including the posterior tibial, peroneal and gastrocnemius veins  when visible. The superficial great saphenous vein was also interrogated. Spectral Doppler was utilized to evaluate flow at rest and with distal augmentation maneuvers in the common femoral, femoral and popliteal veins. COMPARISON:  None FINDINGS: Contralateral Common Femoral Vein: Respiratory phasicity is normal and symmetric with the symptomatic side. No evidence of thrombus. Normal compressibility. Common Femoral Vein: No evidence of thrombus. Normal compressibility, respiratory phasicity and response to augmentation. Saphenofemoral Junction: There is occlusive thrombus at the junction of the saphenous vein with the common femoral vein. Profunda Femoral Vein: No evidence of thrombus. Normal compressibility and flow on color Doppler imaging. Femoral Vein: No evidence of thrombus. Normal compressibility, respiratory phasicity and response to augmentation. Popliteal Vein: There is occlusive thrombus within the right popliteal vein. Calf Veins: No evidence of thrombus. Normal compressibility and flow on color Doppler imaging. Superficial Great Saphenous Vein: No evidence of thrombus. Normal compressibility. Venous Reflux:  None. Other Findings:  None. IMPRESSION: 1. Deep venous thrombus within the right popliteal vein, with superficial thrombus seen at the saphenofemoral junction. These results will be called to the ordering clinician or representative by the Radiologist Assistant, and communication documented in the PACS or zVision Dashboard. Electronically Signed   By: Randa Ngo M.D.   On: 10/27/2019 23:04   DG Chest Port 1 View  Result Date: 10/27/2019 CLINICAL DATA:  Altered level of consciousness, sepsis EXAM: PORTABLE CHEST 1 VIEW COMPARISON:  08/07/2019 FINDINGS: Single frontal view of the chest was performed. Lung volumes are diminished, with crowding of the pulmonary vasculature. No definite airspace disease, effusion, or pneumothorax. The cardiac silhouette is stable. IMPRESSION: 1. Low lung  volumes with crowding of the pulmonary vasculature.  2. No acute airspace disease. Electronically Signed   By: Randa Ngo M.D.   On: 10/27/2019 22:40    Assessment and plan- Patient is a 74 y.o. female who was sent to emergency room for evaluation of confusion.  Was found to have right lower extremity cellulitis and DVT.  #Provoked right lower extremity DVT Patient has been on prophylactic dose of Lovenox since her ankle procedure in December. She was on Xarelto from August to November 2020 for treatment of thrombophlebitis. In November, she had trauma induced left gluteal hematoma and a Xarelto was held during hospitalization and later on stopped outpatient. I think this is a provoked right lower extremity DVT secondary to ankle procedure. I agree with therapeutic Lovenox.  If there is no need of surgical intervention at this point, patient can be discharged on Xarelto. Theoretically, Xarelto can be used with her current GFR, until GFR<30.  Recommend kidney function to be closely monitored.  I discussed about switching to Eliquis given her chronic kidney disease.  She prefers to stay on Xarelto until she discussed with Dr. Clayborn Bigness.     Thank you for allowing me to participate in the care of this patient.     [name], MD, PhD Hematology Oncology [facility] Pager- [phone] 10/28/2019

## 2019-10-28 NOTE — TOC Initial Note (Signed)
Transition of Care [facility]) - Initial/Assessment Note    Patient Details  Name: Becky Trevino MRN: [id number] Date of Birth: [birth year]  Transition of Care [facility]) CM/SW Contact:    Beverly Sessions, RN Phone Number: 10/28/2019, 1:55 PM  Clinical Narrative:                 Patient admitted from home with cellulitis  Patient states that she lives at home her husband  PT eval pending  PCP Schlusser raven Denies issues obtaining medications, or with transportation  Patient states that she is open with [employer].  RNCM confirmed patient is open with RN  Patient states that prior to admission she had a wound vac on from [employer].  Per patient she thinks the plan is to leave the vac off. RNCM informed patient that if vac is indicated at discharge her husband wound have to bring in the home vac for placement   Expected Discharge Plan: [place] Barriers to Discharge: No Barriers Identified   Patient Goals and CMS Choice        Expected Discharge Plan and Services Expected Discharge Plan: [place]       Living arrangements for the past 2 months: Single Family Home                                      Prior Living Arrangements/Services Living arrangements for the past 2 months: Single Family Home Lives with:: Spouse Patient language and need for interpreter reviewed:: Yes Do you feel safe going back to the place where you live?: Yes      Need for Family Participation in Patient Care: Yes (Comment) Care giver support system in place?: Yes (comment) Current home services: Home RN Criminal Activity/Legal Involvement Pertinent to Current Situation/Hospitalization: No - Comment as needed  Activities of Daily Living Home Assistive Devices/Equipment: Wound Vac ADL Screening (condition at time of admission) Patient's cognitive ability adequate to safely complete daily activities?: Yes Is the patient deaf or have  difficulty hearing?: No Does the patient have difficulty seeing, even when wearing glasses/contacts?: No Does the patient have difficulty concentrating, remembering, or making decisions?: No Patient able to express need for assistance with ADLs?: Yes Does the patient have difficulty dressing or bathing?: Yes Independently performs ADLs?: No Communication: Independent Is this a change from baseline?: Pre-admission baseline Dressing (OT): Needs assistance Is this a change from baseline?: Pre-admission baseline Grooming: Needs assistance Is this a change from baseline?: Pre-admission baseline Feeding: Independent Bathing: Needs assistance Is this a change from baseline?: Pre-admission baseline Toileting: Needs assistance Is this a change from baseline?: Pre-admission baseline In/Out Bed: Needs assistance Is this a change from baseline?: Pre-admission baseline Walks in Home: Needs assistance Is this a change from baseline?: Pre-admission baseline Does the patient have difficulty walking or climbing stairs?: Yes Weakness of Legs: Right Weakness of Arms/Hands: None  Permission Sought/Granted                  Emotional Assessment       Orientation: : Oriented to Self, Oriented to Place, Oriented to  Time, Oriented to Situation   Psych Involvement: No (comment)  Admission diagnosis:  Cellulitis of right leg [L03.115] Cellulitis of right lower extremity [L03.115] Altered mental status, unspecified altered mental status type [R41.82] Patient Active Problem List   Diagnosis Date Noted  .  Cellulitis of right lower extremity 10/27/2019  . Hematoma   . Syncope 08/07/2019  . Anemia 08/07/2019  . Chronic infection of right knee ([place]) 11/25/2018  . Infection and inflammatory reaction due to internal joint prosthesis, subsequent encounter 01/25/2018  . Infection of total right knee replacement ([place]) 11/19/2017  . Type 2 diabetes mellitus with diabetic neuropathy, without long-term  current use of insulin ([place]) 11/06/2016  . Primary osteoarthritis of knee 03/11/2016  . Primary osteoarthritis of left hip 12/11/2015  . Benign essential HTN 04/21/2015  . Edema leg 04/21/2015  . Chronic kidney disease (CKD), stage III (moderate) 04/21/2015  . Chronic venous insufficiency 04/21/2015  . DDD (degenerative disc disease), cervical 04/21/2015  . Diabetes mellitus with renal manifestation ([place]) 04/21/2015  . Dyslipidemia 04/21/2015  . Bladder cystocele 04/21/2015  . Urinary incontinence 04/21/2015  . Leg varices 04/21/2015  . Insomnia 04/21/2015  . Eczema intertrigo 04/21/2015  . Obstructive apnea 04/21/2015  . Menopausal and perimenopausal disorder 04/21/2015  . Supraventricular premature beats 04/21/2015  . Osteoarthritis of multiple joints 04/21/2015  . H/O Spinal surgery 11/14/2013  . Detrusor muscle hypertonia 11/07/2013   PCP:  [name], MD Pharmacy:   [id number] - [place], [place] - [address] [place] [address] Phone: [phone] Fax: [phone]     Social Determinants of Health (SDOH) Interventions    Readmission Risk Interventions No flowsheet data found.

## 2019-10-28 NOTE — Progress Notes (Signed)
PHARMACY - PHYSICIAN COMMUNICATION CRITICAL VALUE ALERT - BLOOD CULTURE IDENTIFICATION (BCID)  Results for orders placed or performed during the hospital encounter of 10/27/19  Blood Culture ID Panel (Reflexed) (Collected: 10/27/2019  9:18 PM)  Result Value Ref Range   Enterococcus species NOT DETECTED NOT DETECTED   Listeria monocytogenes NOT DETECTED NOT DETECTED   Staphylococcus species NOT DETECTED NOT DETECTED   Staphylococcus aureus (BCID) NOT DETECTED NOT DETECTED   Streptococcus species DETECTED (A) NOT DETECTED   Streptococcus agalactiae DETECTED (A) NOT DETECTED   Streptococcus pneumoniae NOT DETECTED NOT DETECTED   Streptococcus pyogenes NOT DETECTED NOT DETECTED   Acinetobacter baumannii NOT DETECTED NOT DETECTED   Enterobacteriaceae species NOT DETECTED NOT DETECTED   Enterobacter cloacae complex NOT DETECTED NOT DETECTED   Escherichia coli NOT DETECTED NOT DETECTED   Klebsiella oxytoca NOT DETECTED NOT DETECTED   Klebsiella pneumoniae NOT DETECTED NOT DETECTED   Proteus species NOT DETECTED NOT DETECTED   Serratia marcescens NOT DETECTED NOT DETECTED   Haemophilus influenzae NOT DETECTED NOT DETECTED   Neisseria meningitidis NOT DETECTED NOT DETECTED   Pseudomonas aeruginosa NOT DETECTED NOT DETECTED   Candida albicans NOT DETECTED NOT DETECTED   Candida glabrata NOT DETECTED NOT DETECTED   Candida krusei NOT DETECTED NOT DETECTED   Candida parapsilosis NOT DETECTED NOT DETECTED   Candida tropicalis NOT DETECTED NOT DETECTED   2/11: BCx 4/4 Strep agalactiae  Current Antibiotics: ceftriaxone for cellulitis  Name of physician (or Provider) Contacted: Darlyne Russian  Changes to prescribed antibiotics required: change to Unasyn instead of protocol Pen G due to suspected anaerobes in the wound  Dallie Piles 10/28/2019  10:13 AM

## 2019-10-28 NOTE — Consult Note (Addendum)
PODIATRY / FOOT AND ANKLE SURGERY CONSULTATION NOTE  Requesting Physician: Nicole Kindred DO  Reason for consult: Right lateral ankle wound  Chief Complaint: R ankle wound, leg pain   HPI: Becky Trevino is a 74 y.o. female who presents with a chronic lateral right ankle wound status post is a fracture open reduction with internal fixation in the middle of December 2020 approximately 8 weeks ago.  Patient was seen by Dr. Elvina Mattes after suffering a fall and an x-ray revealed a displaced distal fibular fracture as well as a avulsion of the medial malleolus indicating deltoid type injury.  Patient was then seen as well by myself during that time and the surgical consent was obtained and procedure was performed without incident.  At week 2 postop patient developed a seroma at the central aspect incision.  The seroma was drained.  Patient's wound has gotten smaller and smaller for the past couple of weeks with local wound care over the past weeks.  There has been 1 area proximally that probes deepest 0.7cm.  No bone or plate appears exposed at this time.  The patient has been covered for skin grafts in the future if possible.  Patient is also been applying wound VAC therapy to the incisional dehiscence site.  The past couple days the family has noted that she has been acting very irregular and complaint of right leg swelling.  Patient was sent to the emergency room continues to use issues and patient is noted to have a DVT to the right lower extremity.  Patient has been on Lovenox since the procedure.  Patient was also on Xarelto prior to the procedure.  Patient overall appears to be doing well to the right leg with decreased pain overall to the right lower extremity.  Patient was also noted to have a positive blood culture at the time of admission.  Patient is resting in bed comfortably at this time with no complaints.  PMHx:  Past Medical History:  Diagnosis Date  . Cervical spondylosis   . Chronic kidney  disease    Stage 3  . DDD (degenerative disc disease), cervical    [facility]  . Diabetes mellitus without complication ([place])   . Diverticulosis   . Hyperlipidemia   . Hypertension   . Intertrigo   . Leg cramps   . Leg varices   . Obesity   . OSA (obstructive sleep apnea)   . Symptomatic menopausal or female climacteric states   . Syncope     Surgical Hx:  Past Surgical History:  Procedure Laterality Date  . ABDOMINAL HYSTERECTOMY  1993   Total  . BUNIONECTOMY Bilateral 1993  . I & D KNEE WITH POLY EXCHANGE Right 11/19/2017   Procedure: RIGHT KNEE POLY EXCHANGE WITH IRRIGATION AND DEBRIDEMENT;  Surgeon: Hessie Knows, MD;  Location: [facility] OR[facility];  Service: Orthopedics;  Laterality: Right;  . JOINT REPLACEMENT     bilateral knee  . LAMINECTOMY  11/14/2013    Cervical Fusion , [facility], Dr. Delilah Shan  . LASER ABLATION Bilateral 07/29/2012   Dr. Lucky Cowboy  . ORIF ANKLE FRACTURE Right 09/02/2019   Procedure: OPEN REDUCTION INTERNAL FIXATION (ORIF) ANKLE FRACTURE BIMALLEOLAR;  Surgeon: [name], DPM;  Location: [facility] OR[facility];  Service: Podiatry;  Laterality: Right;  . REPLACEMENT TOTAL KNEE Left 03/2010   Dr. Rudene Christians  . TOTAL HIP ARTHROPLASTY Left 12/11/2015   Procedure: TOTAL HIP ARTHROPLASTY ANTERIOR APPROACH;  Surgeon: Hessie Knows, MD;  Location: [facility] ORS;  Service: Orthopedics;  Laterality: Left;  .  TOTAL KNEE ARTHROPLASTY Right 03/11/2016   Procedure: TOTAL KNEE ARTHROPLASTY;  Surgeon: Hessie Knows, MD;  Location: [facility];  Service: Orthopedics;  Laterality: Right;    FHx:  Family History  Problem Relation Age of Onset  . Diabetes Mother   . Hyperlipidemia Mother   . Hypertension Mother   . Diabetes Father   . Hyperlipidemia Father   . Hypertension Father   . Obesity Father   . Hypertension Sister   . Hyperlipidemia Sister   . Hyperlipidemia Sister   . Breast cancer Neg Hx     Social History:  reports that she has never smoked. She has never used smokeless tobacco. She reports  that she does not drink alcohol or use drugs.  Allergies:  Allergies  Allergen Reactions  . Lisinopril Swelling    Review of Systems: General ROS: negative Respiratory ROS: no cough, shortness of breath, or wheezing Cardiovascular ROS: no chest pain or dyspnea on exertion Musculoskeletal ROS: positive for - swelling in leg - right Neurological ROS: positive for - numbness/tingling Dermatological ROS: positive for right lateral ankle wound  Medications Prior to Admission  Medication Sig Dispense Refill  . acetaminophen (TYLENOL) 650 MG CR tablet Take 650-1,300 mg by mouth 2 (two) times daily as needed for pain.    Marland Kitchen atorvastatin (LIPITOR) 40 MG tablet Take 1 tablet (40 mg total) by mouth daily at 6 PM. (Patient taking differently: Take 40 mg by mouth every evening. ) 90 tablet 1  . Calcium Carbonate-Vitamin D (CALCIUM 600-D) 600-400 MG-UNIT per tablet Take 1 tablet by mouth 2 (two) times daily.    Marland Kitchen doxycycline (VIBRAMYCIN) 100 MG capsule Take 100 mg by mouth 2 (two) times daily.    Marland Kitchen enoxaparin (LOVENOX) 40 MG/0.4ML injection Inject 0.4 mLs (40 mg total) into the skin daily. 20 mL 0  . fluticasone (FLONASE) 50 MCG/ACT nasal spray Place 2 sprays into both nostrils daily. 16 g 2  . glucose blood (ONE TOUCH ULTRA TEST) test strip Use as instructed 100 each 12  . imipramine (TOFRANIL) 25 MG tablet Take 1 tablet (25 mg total) by mouth at bedtime. 90 tablet 3  . ketoconazole (NIZORAL) 2 % cream Apply 1 application topically 2 (two) times daily as needed (rash).     . Magnesium Oxide 500 MG CAPS Take 500 mg by mouth daily.     . metoprolol succinate (TOPROL-XL) 100 MG 24 hr tablet Take 100 mg by mouth at bedtime.    . midodrine (PROAMATINE) 5 MG tablet Take 1 tablet (5 mg total) by mouth 3 (three) times daily with meals. (Patient taking differently: Take 5 mg by mouth 3 (three) times daily. ) 90 tablet 0  . mirabegron ER (MYRBETRIQ) 25 MG TB24 tablet Take 1 tablet (25 mg total) by mouth daily.  90 tablet 3  . oxyCODONE-acetaminophen (PERCOCET) 7.5-325 MG tablet Take 1 tablet by mouth every 4 (four) hours as needed for severe pain.    Marland Kitchen SIMBRINZA 1-0.2 % SUSP Place 1 drop into both eyes 2 (two) times daily.    . celecoxib (CELEBREX) 200 MG capsule Take 200 mg by mouth daily as needed for moderate pain.   11  . furosemide (LASIX) 20 MG tablet Take 20 mg by mouth every evening.    . gabapentin (NEURONTIN) 300 MG capsule Take 1 capsule (300 mg total) by mouth every evening. (Patient not taking: Reported on 09/23/2019) 90 capsule 2  . Netarsudil-Latanoprost (ROCKLATAN) 0.02-0.005 % SOLN Place 1 drop into both eyes  at bedtime.    Marland Kitchen oxycodone (OXY-IR) 5 MG capsule Take 5 mg by mouth every 6 (six) hours as needed for pain.    Marland Kitchen oxyCODONE-acetaminophen (PERCOCET/ROXICET) 5-325 MG tablet Take 1 tablet by mouth as directed.    . potassium chloride (KLOR-CON) 10 MEQ tablet Take 10 mEq by mouth daily.    . traZODone (DESYREL) 50 MG tablet TAKE 1 TABLET BY MOUTH AT BEDTIME AS NEEDED FOR SLEEP (Patient not taking: Reported on 10/27/2019) 90 tablet 1  . XARELTO 20 MG TABS tablet Take 20 mg by mouth daily.      Physical Exam: General: Alert and oriented.  No apparent distress.  Vascular: DP/PT pulses faintly palpable bil.  CFT intact to digits bil.  Mild to mdoerate nonpitting edema noted to the RLE with associated redness and swelling to the calf area.  Neuro: Light touch sensation reduced to digits bil.  Derm: Central aspect of incision line right lateral ankle over the fibular dehiscence site probes to deep subQ tissue, approximately 0.7cm deep, mild serousanginous drainage today, no odor, no periwound erythema present, mild irritation to this area from the wound vac present on examination, no odor, no visible plate or bone.    MSK: POP R calf area.  No POP of the R lateral ankle.  No pain with AJ DF and PF R.   Results for orders placed or performed during the hospital encounter of 10/27/19  (from the past 48 hour(s))  Culture, blood (routine x 2)     Status: None (Preliminary result)   Collection Time: 10/27/19  9:18 PM   Specimen: BLOOD  Result Value Ref Range   Specimen Description BLOOD LEFT ANTECUBITAL    Special Requests      BOTTLES DRAWN AEROBIC AND ANAEROBIC Blood Culture adequate volume   Culture  Setup Time      Organism ID to follow GRAM POSITIVE COCCI IN BOTH AEROBIC AND ANAEROBIC BOTTLES CRITICAL RESULT CALLED TO, READ BACK BY AND VERIFIED WITH: KRISTEN MERRILL AT 1005 10/28/19.PMF Performed at [facility], [address] [place], [place] [address]    Culture GRAM POSITIVE COCCI    Report Status PENDING   CBC with Differential     Status: Abnormal   Collection Time: 10/27/19  9:18 PM  Result Value Ref Range   WBC 16.7 (H) 4.0 - 10.5 K/uL   RBC 4.43 3.87 - 5.11 MIL/uL   Hemoglobin 12.9 12.0 - 15.0 g/dL   HCT 40.9 36.0 - 46.0 %   MCV 92.3 80.0 - 100.0 fL   MCH 29.1 26.0 - 34.0 pg   MCHC 31.5 30.0 - 36.0 g/dL   RDW 16.9 (H) 11.5 - 15.5 %   Platelets 131 (L) 150 - 400 K/uL   nRBC 0.0 0.0 - 0.2 %   Neutrophils Relative % 91 %   Neutro Abs 15.3 (H) 1.7 - 7.7 K/uL   Lymphocytes Relative 4 %   Lymphs Abs 0.7 0.7 - 4.0 K/uL   Monocytes Relative 4 %   Monocytes Absolute 0.6 0.1 - 1.0 K/uL   Eosinophils Relative 0 %   Eosinophils Absolute 0.0 0.0 - 0.5 K/uL   Basophils Relative 0 %   Basophils Absolute 0.0 0.0 - 0.1 K/uL   Immature Granulocytes 1 %   Abs Immature Granulocytes 0.10 (H) 0.00 - 0.07 K/uL    Comment: Performed at [facility], [address], [place], [place] [address]  Comprehensive metabolic panel     Status: Abnormal  Collection Time: 10/27/19  9:18 PM  Result Value Ref Range   Sodium 134 (L) 135 - 145 mmol/L   Potassium 4.0 3.5 - 5.1 mmol/L   Chloride 100 98 - 111 mmol/L   CO2 23 22 - 32 mmol/L   Glucose, Bld 121 (H) 70 - 99 mg/dL   BUN 18 8 - 23 mg/dL   Creatinine, Ser 1.27 (H) 0.44 - 1.00 mg/dL   Calcium 9.6  8.9 - 10.3 mg/dL   Total Protein 8.5 (H) 6.5 - 8.1 g/dL   Albumin 3.9 3.5 - 5.0 g/dL   AST 19 15 - 41 U/L   ALT 16 0 - 44 U/L   Alkaline Phosphatase 121 38 - 126 U/L   Total Bilirubin 0.8 0.3 - 1.2 mg/dL   GFR calc non Af Amer 42 (L) >60 mL/min   GFR calc Af Amer 48 (L) >60 mL/min   Anion gap 11 5 - 15    Comment: Performed at [facility], [address] [place], [place] [address]  Lactic acid, plasma     Status: None   Collection Time: 10/27/19  9:18 PM  Result Value Ref Range   Lactic Acid, Venous 1.8 0.5 - 1.9 mmol/L    Comment: Performed at [facility], [address] [place], [place] [address]  Urinalysis, Complete w Microscopic     Status: Abnormal   Collection Time: 10/27/19  9:18 PM  Result Value Ref Range   Color, Urine YELLOW (A) YELLOW   APPearance CLOUDY (A) CLEAR   Specific Gravity, Urine 1.019 1.005 - 1.030   pH 5.0 5.0 - 8.0   Glucose, UA NEGATIVE NEGATIVE mg/dL   Hgb urine dipstick LARGE (A) NEGATIVE   Bilirubin Urine NEGATIVE NEGATIVE   Ketones, ur NEGATIVE NEGATIVE mg/dL   Protein, ur 30 (A) NEGATIVE mg/dL   Nitrite NEGATIVE NEGATIVE   Leukocytes,Ua NEGATIVE NEGATIVE   RBC / HPF 21-50 0 - 5 RBC/hpf   WBC, UA 6-10 0 - 5 WBC/hpf   Bacteria, UA MANY (A) NONE SEEN   Squamous Epithelial / LPF 21-50 0 - 5   Mucus PRESENT     Comment: Performed at [facility], [address] [place],  [address]  Blood Culture ID Panel (Reflexed)     Status: Abnormal   Collection Time: 10/27/19  9:18 PM  Result Value Ref Range   Enterococcus species NOT DETECTED NOT DETECTED   Listeria monocytogenes NOT DETECTED NOT DETECTED   Staphylococcus species NOT DETECTED NOT DETECTED   Staphylococcus aureus (BCID) NOT DETECTED NOT DETECTED   Streptococcus species DETECTED (A) NOT DETECTED    Comment: CRITICAL RESULT CALLED TO, READ BACK BY AND VERIFIED WITH: KRISTEN MERRILL AT 1005 10/28/19.PMF    Streptococcus agalactiae DETECTED (A) NOT  DETECTED    Comment: CRITICAL RESULT CALLED TO, READ BACK BY AND VERIFIED WITH: KRISTEN MERRILL AT 1005 10/28/19.PMF    Streptococcus pneumoniae NOT DETECTED NOT DETECTED   Streptococcus pyogenes NOT DETECTED NOT DETECTED   Acinetobacter baumannii NOT DETECTED NOT DETECTED   Enterobacteriaceae species NOT DETECTED NOT DETECTED   Enterobacter cloacae complex NOT DETECTED NOT DETECTED   Escherichia coli NOT DETECTED NOT DETECTED   Klebsiella oxytoca NOT DETECTED NOT DETECTED   Klebsiella pneumoniae NOT DETECTED NOT DETECTED   Proteus species NOT DETECTED NOT DETECTED   Serratia marcescens NOT DETECTED NOT DETECTED   Haemophilus influenzae NOT DETECTED NOT DETECTED   Neisseria meningitidis NOT DETECTED NOT DETECTED   Pseudomonas aeruginosa  NOT DETECTED NOT DETECTED   Candida albicans NOT DETECTED NOT DETECTED   Candida glabrata NOT DETECTED NOT DETECTED   Candida krusei NOT DETECTED NOT DETECTED   Candida parapsilosis NOT DETECTED NOT DETECTED   Candida tropicalis NOT DETECTED NOT DETECTED    Comment: Performed at [facility], [address] [place], [place] [address]  Culture, blood (routine x 2)     Status: None (Preliminary result)   Collection Time: 10/27/19  9:23 PM   Specimen: BLOOD  Result Value Ref Range   Specimen Description BLOOD BLOOD LEFT HAND    Special Requests      BOTTLES DRAWN AEROBIC AND ANAEROBIC Blood Culture adequate volume   Culture  Setup Time      GRAM POSITIVE COCCI IN BOTH AEROBIC AND ANAEROBIC BOTTLES CRITICAL RESULT CALLED TO, READ BACK BY AND VERIFIED WITH: KRISTEN MERRILL AT 1005 10/28/19.PMF Performed at [facility], [address] [place], [place] [address]    Culture GRAM POSITIVE COCCI    Report Status PENDING   SARS CORONAVIRUS 2 (TAT 6-24 HRS) Nasopharyngeal Nasopharyngeal Swab     Status: None   Collection Time: 10/27/19 11:07 PM   Specimen: Nasopharyngeal Swab  Result Value Ref Range   SARS Coronavirus 2 NEGATIVE  NEGATIVE    Comment: (NOTE) SARS-CoV-2 target nucleic acids are NOT DETECTED. The SARS-CoV-2 RNA is generally detectable in upper and lower respiratory specimens during the acute phase of infection. Negative results do not preclude SARS-CoV-2 infection, do not rule out co-infections with other pathogens, and should not be used as the sole basis for treatment or other patient management decisions. Negative results must be combined with clinical observations, patient history, and epidemiological information. The expected result is Negative. Fact Sheet for Patients: SugarRoll.be Fact Sheet for Healthcare Providers: https://www.woods-mathews.com/ This test is not yet approved or cleared by the [place] FDA and  has been authorized for detection and/or diagnosis of SARS-CoV-2 by FDA under an Emergency Use Authorization (EUA). This EUA will remain  in effect (meaning this test can be used) for the duration of the COVID-19 declaration under Section 56 4(b)(1) of the Act, 21 U.S.C. section 360bbb-3(b)(1), unless the authorization is terminated or revoked sooner. Performed at [facility], [place] [address], [place], [place] [address]   Lactic acid, plasma     Status: None   Collection Time: 10/27/19 11:23 PM  Result Value Ref Range   Lactic Acid, Venous 1.7 0.5 - 1.9 mmol/L    Comment: Performed at [facility], [address] [place], [place] [address]  Hemoglobin A1c     Status: None   Collection Time: 10/28/19  6:23 AM  Result Value Ref Range   Hgb A1c MFr Bld 5.5 4.8 - 5.6 %    Comment: (NOTE) Pre diabetes:          5.7%-6.4% Diabetes:              >6.4% Glycemic control for   <7.0% adults with diabetes    Mean Plasma Glucose 111.15 mg/dL    Comment: Performed at [facility] [address] [place], [place] [address]  APTT     Status: None   Collection Time: 10/28/19  6:23 AM  Result Value Ref Range   aPTT 28  24 - 36 seconds    Comment: Performed at [facility], [address] [place], [place] [address]  Protime-INR     Status: Abnormal   Collection Time:  10/28/19  6:23 AM  Result Value Ref Range   Prothrombin Time 15.4 (H) 11.4 - 15.2 seconds   INR 1.2 0.8 - 1.2    Comment: (NOTE) INR goal varies based on device and disease states. Performed at [facility], [name] [place], [place] [address]   Glucose, capillary     Status: None   Collection Time: 10/28/19  8:03 AM  Result Value Ref Range   Glucose-Capillary 73 70 - 99 mg/dL   Comment 1 Notify RN    DG Ankle 2 Views Right  Result Date: 10/28/2019 CLINICAL DATA:  The patient suffered a distal fibular fracture due to a fall in December, 2020. The patient underwent ORIF 09/02/2019 and now has developed swelling, warmth and tenderness about the surgical site. EXAM: RIGHT ANKLE - 2 VIEW COMPARISON:  Intraoperative imaging for fracture fixation 09/02/2019. FINDINGS: Plate and screw fixation of a distal fibular fracture is identified. Position and alignment are anatomic. Subtle lucency about fixation screws is most notable about the 3rd-6th screws from the top and worrisome for infection or loosening. Soft tissues about the ankle are swollen. No soft tissue gas is identified. Vac drain is in place. IMPRESSION: Status post fixation of a distal fibular fracture. There is subtle lucency about multiple fixation screws worrisome for loosening or infection. Soft tissue swelling about the ankle without soft tissue gas. Electronically Signed   By: Inge Rise M.D.   On: 10/28/2019 09:29   CT Head Wo Contrast  Result Date: 10/27/2019 CLINICAL DATA:  Encephalopathy EXAM: CT HEAD WITHOUT CONTRAST TECHNIQUE: Contiguous axial images were obtained from the base of the skull through the vertex without intravenous contrast. COMPARISON:  Head CT 08/07/2019 FINDINGS: Brain: There is no mass, hemorrhage or extra-axial collection. The  size and configuration of the ventricles and extra-axial CSF spaces are normal. The brain parenchyma is normal, without acute or chronic infarction. Vascular: No abnormal hyperdensity of the major intracranial arteries or dural venous sinuses. No intracranial atherosclerosis. Skull: Craniocervical fusion. Sinuses/Orbits: No fluid levels or advanced mucosal thickening of the visualized paranasal sinuses. No mastoid or middle ear effusion. The orbits are normal. IMPRESSION: Normal brain. Electronically Signed   By: Ulyses Jarred M.D.   On: 10/27/2019 22:18   US Venous Img Lower Unilateral Right  Result Date: 10/27/2019 CLINICAL DATA:  Erythema and edema EXAM: Right LOWER EXTREMITY VENOUS DOPPLER ULTRASOUND TECHNIQUE: Gray-scale sonography with graded compression, as well as color Doppler and duplex ultrasound were performed to evaluate the lower extremity deep venous systems from the level of the common femoral vein and including the common femoral, femoral, profunda femoral, popliteal and calf veins including the posterior tibial, peroneal and gastrocnemius veins when visible. The superficial great saphenous vein was also interrogated. Spectral Doppler was utilized to evaluate flow at rest and with distal augmentation maneuvers in the common femoral, femoral and popliteal veins. COMPARISON:  None FINDINGS: Contralateral Common Femoral Vein: Respiratory phasicity is normal and symmetric with the symptomatic side. No evidence of thrombus. Normal compressibility. Common Femoral Vein: No evidence of thrombus. Normal compressibility, respiratory phasicity and response to augmentation. Saphenofemoral Junction: There is occlusive thrombus at the junction of the saphenous vein with the common femoral vein. Profunda Femoral Vein: No evidence of thrombus. Normal compressibility and flow on color Doppler imaging. Femoral Vein: No evidence of thrombus. Normal compressibility, respiratory phasicity and response to  augmentation. Popliteal Vein: There is occlusive thrombus within the right popliteal vein. Calf Veins: No evidence of thrombus. Normal  compressibility and flow on color Doppler imaging. Superficial Great Saphenous Vein: No evidence of thrombus. Normal compressibility. Venous Reflux:  None. Other Findings:  None. IMPRESSION: 1. Deep venous thrombus within the right popliteal vein, with superficial thrombus seen at the saphenofemoral junction. These results will be called to the ordering clinician or representative by the Radiologist Assistant, and communication documented in the PACS or zVision Dashboard. Electronically Signed   By: Randa Ngo M.D.   On: 10/27/2019 23:04   DG Chest Port 1 View  Result Date: 10/27/2019 CLINICAL DATA:  Altered level of consciousness, sepsis EXAM: PORTABLE CHEST 1 VIEW COMPARISON:  08/07/2019 FINDINGS: Single frontal view of the chest was performed. Lung volumes are diminished, with crowding of the pulmonary vasculature. No definite airspace disease, effusion, or pneumothorax. The cardiac silhouette is stable. IMPRESSION: 1. Low lung volumes with crowding of the pulmonary vasculature. 2. No acute airspace disease. Electronically Signed   By: Randa Ngo M.D.   On: 10/27/2019 22:40    Blood pressure (!) 101/59, pulse 75, temperature 98.4 F (36.9 C), temperature source Oral, resp. rate 17, height 5\' 11"  (1.803 m), weight 116.2 kg, SpO2 98 %.  Assessment 1. Bacteremia with leukocytosis and altered mental status potentially 2/2 R lateral ankle wound vs other source? 2. DVT right lower extremity 3. Diabetes type 2 polyneuropathy  Plan -Patient seen and examined. -X-ray reviewed.  Fracture appears to be in excellent alignment still and appears to be healed at this time.  There does not appear to be any periosteal reaction or loosening of the hardware.  The locking screws appear to be intact in the plate.  Do not agree with radiology read.  No signs of infection or  osteomyelitis present based on the x-ray. -Wound examined today and noted to have no purulent drainage that was able to be expressed, wound bed appears to be fairly granular overall with healthy bleeding tissue.  The wound still does probe fairly deep though 0.7 cm but no plate or bone appears to be exposed at this time.  No odor.  No periwound erythema or edema present to this level. -100% excisional subQ wound debridement performed with a 15 blade woi.  Patient tolerated well. -Right calf does appear to be warm to touch with increased swelling compared to previous visit this week.  Appreciate hematology review and DVT prophylaxis measures and treatment. -Infectious disease consulted due to bacteremia.  Appreciate recommendations for antibiotic management. -If no other sources of infection are found then we could perform a CT scan of the right ankle to determine how much of the bone is healed and if healed could perform removal of the plate and screws with bone debridement and bone cultures.  Or we could proceed conservatively with IV antibiotics as there appears to be no clinical or radiographic evidence for osteomyelitis.  Will await other consultations prior to going this route. -Patient had recent ABIs performed showing little to no vascular disease.   [name], DPM 10/28/2019, 3:30 PM

## 2019-10-28 NOTE — Evaluation (Signed)
Physical Therapy Evaluation Patient Details Name: Becky Trevino MRN: [id number] DOB: July 02, 1946 Today's Date: 10/28/2019   History of Present Illness  Pt admitted with R LE cellulitis with complaints of confusion and dyspnea. HIstory includes R ankle fx s/p ORIF on 12.18.20, DM, and HTN. Pt with positive DVT, started on anticoag this date.  Clinical Impression  Pt is a pleasant 74 year old female who was admitted for R LE cellulitis and now with +DVT. Pt performs bed mobility with cga, transfers with min assist, and ambulation to recliner with min assist and HHA. Pt demonstrates deficits with strength/transfers/mobility. Pt currently at baseline level and reports she has sufficient assist from her daughter. Would benefit from skilled PT to address above deficits and promote optimal return to PLOF. Currently recommending OP PT (has a scheduled visit for next week).     Follow Up Recommendations Outpatient PT    Equipment Recommendations  None recommended by PT    Recommendations for Other Services       Precautions / Restrictions Precautions Precautions: Fall Restrictions Weight Bearing Restrictions: Yes RLE Weight Bearing: Non weight bearing      Mobility  Bed Mobility Overal bed mobility: Needs Assistance Bed Mobility: Supine to Sit     Supine to sit: Min guard     General bed mobility comments: safe technique with pt able to sit with upright posture while at EOB.  Transfers Overall transfer level: Needs assistance Equipment used: 1 person hand held assist Transfers: Sit to/from Stand Sit to Stand: Min assist         General transfer comment: needs elevated bed and HHA. Once standing, able to stand with upright posture and maintain correct WBing status.  Ambulation/Gait Ambulation/Gait assistance: Min assist Gait Distance (Feet): 3 Feet Assistive device: 1 person hand held assist Gait Pattern/deviations: Step-to pattern     General Gait Details: able to hop  over to recliner maintaining correct WBing pattern. Safe technique.   Stairs            Wheelchair Mobility    Modified Rankin (Stroke Patients Only)       Balance Overall balance assessment: Modified Independent                                           Pertinent Vitals/Pain Pain Assessment: Faces Faces Pain Scale: Hurts a little bit Pain Location: R ankle Pain Descriptors / Indicators: Aching Pain Intervention(s): Limited activity within patient's tolerance    Home Living Family/patient expects to be discharged to:: Private residence Living Arrangements: Spouse/significant other Available Help at Discharge: Family Type of Home: Apartment Home Access: Level entry     Home Layout: One level Home Equipment: Environmental consultant - 2 wheels;Cane - single point;Bedside commode(knee scooter)      Prior Function Level of Independence: Independent         Comments: Prior to injury, pt was independent with all mobility. Since her Sx, she has been using knee scooter and transferring with daughter assistance.     Hand Dominance        Extremity/Trunk Assessment   Upper Extremity Assessment Upper Extremity Assessment: Overall WFL for tasks assessed    Lower Extremity Assessment Lower Extremity Assessment: Generalized weakness(R LE grossly 3+/5; L LE grossly 4/5)       Communication   Communication: No difficulties  Cognition Arousal/Alertness: Awake/alert Behavior During Therapy: WFL for  tasks assessed/performed Overall Cognitive Status: Within Functional Limits for tasks assessed                                        General Comments      Exercises Other Exercises Other Exercises: Seated therex performed on B LE including quad sets, SLR, and hip abd/add. Pt requires cga for ther-ex completion.  10 reps performed   Assessment/Plan    PT Assessment Patient needs continued PT services  PT Problem List Decreased  strength;Decreased activity tolerance;Decreased balance;Decreased mobility;Decreased knowledge of use of DME       PT Treatment Interventions Gait training;Therapeutic exercise;Balance training;DME instruction    PT Goals (Current goals can be found in the Care Plan section)  Acute Rehab PT Goals Patient Stated Goal: to go home PT Goal Formulation: With patient Time For Goal Achievement: 11/11/19 Potential to Achieve Goals: Good    Frequency Min 2X/week   Barriers to discharge        Co-evaluation               AM-PAC PT "6 Clicks" Mobility  Outcome Measure Help needed turning from your back to your side while in a flat bed without using bedrails?: None Help needed moving from lying on your back to sitting on the side of a flat bed without using bedrails?: A Little Help needed moving to and from a bed to a chair (including a wheelchair)?: A Little Help needed standing up from a chair using your arms (e.g., wheelchair or bedside chair)?: A Little Help needed to walk in hospital room?: A Lot Help needed climbing 3-5 steps with a railing? : A Lot 6 Click Score: 17    End of Session Equipment Utilized During Treatment: Gait belt Activity Tolerance: Patient tolerated treatment well Patient left: in chair;with chair alarm set Nurse Communication: Mobility status PT Visit Diagnosis: Muscle weakness (generalized) (M62.81);Difficulty in walking, not elsewhere classified (R26.2);Unsteadiness on feet (R26.81)    Time: [id number] PT Time Calculation (min) (ACUTE ONLY): 23 min   Charges:   PT Evaluation $PT Eval Moderate Complexity: 1 Mod PT Treatments $Therapeutic Exercise: 8-22 mins        Greggory Stallion, PT, DPT [phone]   Clemens Lachman 10/28/2019, 5:07 PM

## 2019-10-28 NOTE — Consult Note (Signed)
NAME: Becky Trevino  DOB: Jun 26, 1946  MRN: [id number]  Date/Time: 10/28/2019 11:53 AM  REQUESTING PROVIDER: Dr.Griffith Subjective:  REASON FOR CONSULT: Bacteremia ? Becky Trevino is a 74 y.o. with a history of DM, HLD, HTN, DDD, RT TKA, PJI, RT ankle fracture, ORIF De 2020 Admitted with confusion and dyspnea.   On 08/29/19  saw podiatrist for rt ankle pain as she had fallen in her bathroom because of dizziness. Noted to have Comminuted right distal fibular fracture with butterfly fragment posteriorly, displaced And   Avulsion fracture at the medial malleolus/deltoid injury underwent ORIF on 09/02/19 by Dr.Baker. The surgical site did nit heal completely - there was  a slit like dehiscence- She was having a wound vac as OP.  In 2017 she was treated for PJI with 6 weeks of IV vanco and ceftiaxone , culture had coag neg staph, was on suppressive doxy + rifampin because of retained prosthesis.I saw her oct 2019 and Dc rifampin. She has been followed by Dr.Fitzgerald since then and is on doxy In Nov was in the [place] between 08/07/19-08/09/19 after  she passed out at work and hit a metal pole She developed a left gluteal hematoma and 295 ml of dark purple blood aspirated by ortho on Dec , 11 2020  She was admitted Past Medical History:  Diagnosis Date  . Cervical spondylosis   . Chronic kidney disease    Stage 3  . DDD (degenerative disc disease), cervical    [facility]  . Diabetes mellitus without complication ([place])   . Diverticulosis   . Hyperlipidemia   . Hypertension   . Intertrigo   . Leg cramps   . Leg varices   . Obesity   . OSA (obstructive sleep apnea)   . Symptomatic menopausal or female climacteric states   . Syncope     Past Surgical History:  Procedure Laterality Date  . ABDOMINAL HYSTERECTOMY  1993   Total  . BUNIONECTOMY Bilateral 1993  . I & D KNEE WITH POLY EXCHANGE Right 11/19/2017   Procedure: RIGHT KNEE POLY EXCHANGE WITH IRRIGATION AND  DEBRIDEMENT;  Surgeon: Hessie Knows, MD;  Location: [facility] ORS;  Service: Orthopedics;  Laterality: Right;  . JOINT REPLACEMENT     bilateral knee  . LAMINECTOMY  11/14/2013    Cervical Fusion , [facility], Dr. Delilah Shan  . LASER ABLATION Bilateral 07/29/2012   Dr. Lucky Cowboy  . ORIF ANKLE FRACTURE Right 09/02/2019   Procedure: OPEN REDUCTION INTERNAL FIXATION (ORIF) ANKLE FRACTURE BIMALLEOLAR;  Surgeon: [name], DPM;  Location: [facility] ORS;  Service: Podiatry;  Laterality: Right;  . REPLACEMENT TOTAL KNEE Left 03/2010   Dr. Rudene Christians  . TOTAL HIP ARTHROPLASTY Left 12/11/2015   Procedure: TOTAL HIP ARTHROPLASTY ANTERIOR APPROACH;  Surgeon: Hessie Knows, MD;  Location: [facility] ORS;  Service: Orthopedics;  Laterality: Left;  . TOTAL KNEE ARTHROPLASTY Right 03/11/2016   Procedure: TOTAL KNEE ARTHROPLASTY;  Surgeon: Hessie Knows, MD;  Location: [facility] ORS;  Service: Orthopedics;  Laterality: Right;    Social History   Socioeconomic History  . Marital status: Married    Spouse name: Not on file  . Number of children: 1  . Years of education: Not on file  . Highest education level: Not on file  Occupational History  . Not on file  Tobacco Use  . Smoking status: Never Smoker  . Smokeless tobacco: Never Used  Substance and Sexual Activity  . Alcohol use: No    Alcohol/week: 0.0 standard drinks  . Drug  use: No  . Sexual activity: Yes    Partners: Male  Other Topics Concern  . Not on file  Social History Narrative   Married, one adopted grown child    Social Determinants of Health   Financial Resource Strain: Low Risk   . Difficulty of Paying Living Expenses: Not hard at all  Food Insecurity: No Food Insecurity  . Worried About Charity fundraiser in the Last Year: Never true  . Ran Out of Food in the Last Year: Never true  Transportation Needs: No Transportation Needs  . Lack of Transportation (Medical): No  . Lack of Transportation (Non-Medical): No  Physical Activity: Insufficiently Active  . Days of  Exercise per Week: 3 days  . Minutes of Exercise per Session: 30 min  Stress: No Stress Concern Present  . Feeling of Stress : Not at all  Social Connections: Slightly Isolated  . Frequency of Communication with Friends and Family: More than three times a week  . Frequency of Social Gatherings with Friends and Family: More than three times a week  . Attends Religious Services: More than 4 times per year  . Active Member of Clubs or Organizations: No  . Attends Archivist Meetings: Never  . Marital Status: Married  Human resources officer Violence: Not At Risk  . Fear of Current or Ex-Partner: No  . Emotionally Abused: No  . Physically Abused: No  . Sexually Abused: No    Family History  Problem Relation Age of Onset  . Diabetes Mother   . Hyperlipidemia Mother   . Hypertension Mother   . Diabetes Father   . Hyperlipidemia Father   . Hypertension Father   . Obesity Father   . Hypertension Sister   . Hyperlipidemia Sister   . Hyperlipidemia Sister   . Breast cancer Neg Hx    Allergies  Allergen Reactions  . Lisinopril Swelling    ? Current Facility-Administered Medications  Medication Dose Route Frequency Provider Last Rate Last Admin  . 0.9 %  sodium chloride infusion   Intravenous Continuous Mansy, Jan A, MD 100 mL/hr at 10/28/19 0726 New Bag at 10/28/19 0726  . acetaminophen (TYLENOL) tablet 650 mg  650 mg Oral Q6H PRN Mansy, Jan A, MD   650 mg at 10/28/19 0932   Or  . acetaminophen (TYLENOL) suppository 650 mg  650 mg Rectal Q6H PRN Mansy, Jan A, MD      . Ampicillin-Sulbactam (UNASYN) 3 g in sodium chloride 0.9 % 100 mL IVPB  3 g Intravenous Q6H Dallie Piles, RPH      . atorvastatin (LIPITOR) tablet 40 mg  40 mg Oral QPM Mansy, Jan A, MD      . calcium-vitamin D (OSCAL WITH D) 500-200 MG-UNIT per tablet 1 tablet  1 tablet Oral BID Mansy, Arvella Merles, MD   1 tablet at 10/28/19 0932  . enoxaparin (LOVENOX) injection 115 mg  115 mg Subcutaneous Q12H Mansy, Jan A, MD    115 mg at 10/28/19 0900  . fluticasone (FLONASE) 50 MCG/ACT nasal spray 2 spray  2 spray Each Nare Daily Mansy, Jan A, MD   2 spray at 10/28/19 0933  . insulin aspart (novoLOG) injection 0-15 Units  0-15 Units Subcutaneous TID PC & HS Mansy, Jan A, MD      . insulin aspart (novoLOG) injection 3 Units  3 Units Subcutaneous TID WC Mansy, Jan A, MD      . magnesium hydroxide (MILK OF MAGNESIA) suspension 30 mL  30 mL Oral Daily PRN Mansy, Jan A, MD      . magnesium oxide (MAG-OX) tablet 400 mg  400 mg Oral Daily Mansy, Jan A, MD   400 mg at 10/28/19 0932  . metoprolol succinate (TOPROL-XL) 24 hr tablet 100 mg  100 mg Oral QHS Mansy, Jan A, MD      . midodrine (PROAMATINE) tablet 5 mg  5 mg Oral TID PC Mansy, Jan A, MD   5 mg at 10/28/19 0933  . mirabegron ER (MYRBETRIQ) tablet 25 mg  25 mg Oral Daily Mansy, Jan A, MD   25 mg at 10/28/19 0933  . morphine 2 MG/ML injection 2 mg  2 mg Intravenous Q4H PRN Mansy, Jan A, MD      . ondansetron [facility]) tablet 4 mg  4 mg Oral Q6H PRN Mansy, Jan A, MD       Or  . ondansetron [facility]) injection 4 mg  4 mg Intravenous Q6H PRN Mansy, Jan A, MD      . traZODone (DESYREL) tablet 25 mg  25 mg Oral QHS PRN Mansy, Arvella Merles, MD         Abtx:  Anti-infectives (From admission, onward)   Start     Dose/Rate Route Frequency Ordered Stop   10/28/19 2200  cefTRIAXone (ROCEPHIN) 2 g in sodium chloride 0.9 % 100 mL IVPB  Status:  Discontinued     2 g 200 mL/hr over 30 Minutes Intravenous Every 24 hours 10/28/19 0506 10/28/19 1140   10/28/19 1200  Ampicillin-Sulbactam (UNASYN) 3 g in sodium chloride 0.9 % 100 mL IVPB     3 g 200 mL/hr over 30 Minutes Intravenous Every 6 hours 10/28/19 1140     10/27/19 2245  vancomycin (VANCOCIN) IVPB 1000 mg/200 mL premix     1,000 mg 200 mL/hr over 60 Minutes Intravenous  Once 10/27/19 2231 10/28/19 0009   10/27/19 2215  vancomycin (VANCOCIN) IVPB 1000 mg/200 mL premix     1,000 mg 200 mL/hr over 60 Minutes Intravenous  Once 10/27/19  2212 10/27/19 2332   10/27/19 2215  cefTRIAXone (ROCEPHIN) 2 g in sodium chloride 0.9 % 100 mL IVPB     2 g 200 mL/hr over 30 Minutes Intravenous  Once 10/27/19 2212 10/27/19 2302      REVIEW OF SYSTEMS:  Const: negative fever, negative chills, negative weight loss Eyes: negative diplopia or visual changes, negative eye pain ENT: negative coryza, negative sore throat Resp: negative cough, hemoptysis, dyspnea Cards: negative for chest pain, palpitations, lower extremity edema GU: negative for frequency, dysuria and hematuria GI: Negative for abdominal pain, diarrhea, bleeding, constipation Skin: negative for rash and pruritus Heme: negative for easy bruising and gum/nose bleeding [id number] Neurolo:dizziness Psych: negative for feelings of anxiety, depression  Endocrine: negative for thyroid, diabetes Allergy/Immunology- as above: Objective:  VITALS:  BP (!) 101/59 (BP Location: Right Arm)   Pulse 75   Temp 98.4 F (36.9 C) (Oral)   Resp 17   Ht 5\' 11"  (1.803 m)   Wt 116.2 kg   SpO2 98%   BMI 35.73 kg/m  PHYSICAL EXAM:  General: Alert, cooperative, no distress,  Head: Normocephalic, without obvious abnormality, atraumatic. Eyes: Conjunctivae clear, anicteric sclerae. Pupils are equal ENT Nares normal. No drainage or sinus tenderness. Lips, mucosa, and tongue normal. No Thrush Neck: Supple, symmetrical, no adenopathy, thyroid: non tender no carotid bruit and no JVD. Back- did not examine Lungs: Clear to auscultation bilaterally. No Wheezing or Rhonchi. No rales. Heart:  Regular rate and rhythm, no murmur, rub or gallop. Abdomen: Soft, non-tender,not distended. Bowel sounds normal. No masses Extremities: rt knee chronic swelling- surgical scar Rt ankle - lateral margin surgical site has healed except for a slit like opening Wound does not probe to bone    Edema legs  Skin: No rashes or lesions. Or bruising Lymph: Cervical, supraclavicular normal. Neurologic:  Grossly non-focal Pertinent Labs Lab Results CBC    Component Value Date/Time   WBC 16.7 (H) 10/27/2019 2118   RBC 4.43 10/27/2019 2118   HGB 12.9 10/27/2019 2118   HGB 13.2 11/26/2015 1122   HCT 40.9 10/27/2019 2118   HCT 40.9 11/26/2015 1122   PLT 131 (L) 10/27/2019 2118   PLT 224 11/26/2015 1122   MCV 92.3 10/27/2019 2118   MCV 96 11/26/2015 1122   MCV 96 10/18/2012 1716   MCH 29.1 10/27/2019 2118   MCHC 31.5 10/27/2019 2118   RDW 16.9 (H) 10/27/2019 2118   RDW 15.1 11/26/2015 1122   RDW 15.2 (H) 10/18/2012 1716   LYMPHSABS 0.7 10/27/2019 2118   LYMPHSABS 2.3 11/26/2015 1122   MONOABS 0.6 10/27/2019 2118   EOSABS 0.0 10/27/2019 2118   EOSABS 0.3 11/26/2015 1122   BASOSABS 0.0 10/27/2019 2118   BASOSABS 0.0 11/26/2015 1122    CMP Latest Ref Rng & Units 10/27/2019 09/02/2019 08/23/2019  Glucose 70 - 99 mg/dL 121(H) 93 92  BUN 8 - 23 mg/dL 18 14 15   Creatinine 0.44 - 1.00 mg/dL 1.27(H) 1.00 1.14(H)  Sodium 135 - 145 mmol/L 134(L) 139 141  Potassium 3.5 - 5.1 mmol/L 4.0 3.8 3.9  Chloride 98 - 111 mmol/L 100 106 104  CO2 22 - 32 mmol/L 23 - 27  Calcium 8.9 - 10.3 mg/dL 9.6 - 9.5  Total Protein 6.5 - 8.1 g/dL 8.5(H) - 7.4  Total Bilirubin 0.3 - 1.2 mg/dL 0.8 - 0.8  Alkaline Phos 38 - 126 U/L 121 - -  AST 15 - 41 U/L 19 - 21  ALT 0 - 44 U/L 16 - 12      Microbiology: Recent Results (from the past 240 hour(s))  Culture, blood (routine x 2)     Status: None (Preliminary result)   Collection Time: 10/27/19  9:18 PM   Specimen: BLOOD  Result Value Ref Range Status   Specimen Description BLOOD LEFT ANTECUBITAL  Final   Special Requests   Final    BOTTLES DRAWN AEROBIC AND ANAEROBIC Blood Culture adequate volume   Culture  Setup Time   Final    Organism ID to follow GRAM POSITIVE COCCI IN BOTH AEROBIC AND ANAEROBIC BOTTLES CRITICAL RESULT CALLED TO, READ BACK BY AND VERIFIED WITH: KRISTEN MERRILL AT 1005 10/28/19.PMF Performed at [facility], [address] [place], [place] [address]    Culture [facility] POSITIVE COCCI  Final   Report Status PENDING  Incomplete  Blood Culture ID Panel (Reflexed)     Status: Abnormal   Collection Time: 10/27/19  9:18 PM  Result Value Ref Range Status   Enterococcus species NOT DETECTED NOT DETECTED Final   Listeria monocytogenes NOT DETECTED NOT DETECTED Final   Staphylococcus species NOT DETECTED NOT DETECTED Final   Staphylococcus aureus (BCID) NOT DETECTED NOT DETECTED Final   Streptococcus species DETECTED (A) NOT DETECTED Final    Comment: CRITICAL RESULT CALLED TO, READ BACK BY AND VERIFIED WITH: KRISTEN MERRILL AT [id number] 10/28/19.PMF    Streptococcus agalactiae DETECTED (A) NOT DETECTED Final    Comment: CRITICAL  RESULT CALLED TO, READ BACK BY AND VERIFIED WITH: KRISTEN MERRILL AT 1005 10/28/19.PMF    Streptococcus pneumoniae NOT DETECTED NOT DETECTED Final   Streptococcus pyogenes NOT DETECTED NOT DETECTED Final   Acinetobacter baumannii NOT DETECTED NOT DETECTED Final   Enterobacteriaceae species NOT DETECTED NOT DETECTED Final   Enterobacter cloacae complex NOT DETECTED NOT DETECTED Final   Escherichia coli NOT DETECTED NOT DETECTED Final   Klebsiella oxytoca NOT DETECTED NOT DETECTED Final   Klebsiella pneumoniae NOT DETECTED NOT DETECTED Final   Proteus species NOT DETECTED NOT DETECTED Final   Serratia marcescens NOT DETECTED NOT DETECTED Final   Haemophilus influenzae NOT DETECTED NOT DETECTED Final   Neisseria meningitidis NOT DETECTED NOT DETECTED Final   Pseudomonas aeruginosa NOT DETECTED NOT DETECTED Final   Candida albicans NOT DETECTED NOT DETECTED Final   Candida glabrata NOT DETECTED NOT DETECTED Final   Candida krusei NOT DETECTED NOT DETECTED Final   Candida parapsilosis NOT DETECTED NOT DETECTED Final   Candida tropicalis NOT DETECTED NOT DETECTED Final    Comment: Performed at [facility], [address] [place], [place] [address]  Culture, blood (routine x 2)      Status: None (Preliminary result)   Collection Time: 10/27/19  9:23 PM   Specimen: BLOOD  Result Value Ref Range Status   Specimen Description BLOOD BLOOD LEFT HAND  Final   Special Requests   Final    BOTTLES DRAWN AEROBIC AND ANAEROBIC Blood Culture adequate volume   Culture  Setup Time   Final    GRAM POSITIVE COCCI IN BOTH AEROBIC AND ANAEROBIC BOTTLES CRITICAL RESULT CALLED TO, READ BACK BY AND VERIFIED WITH: KRISTEN MERRILL AT 1005 10/28/19.PMF Performed at [facility], [address] [place], [place] [address]    Culture [facility] POSITIVE COCCI  Final   Report Status PENDING  Incomplete  SARS CORONAVIRUS 2 (TAT 6-24 HRS) Nasopharyngeal Nasopharyngeal Swab     Status: None   Collection Time: 10/27/19 11:07 PM   Specimen: Nasopharyngeal Swab  Result Value Ref Range Status   SARS Coronavirus 2 NEGATIVE NEGATIVE Final    Comment: (NOTE) SARS-CoV-2 target nucleic acids are NOT DETECTED. The SARS-CoV-2 RNA is generally detectable in upper and lower respiratory specimens during the acute phase of infection. Negative results do not preclude SARS-CoV-2 infection, do not rule out co-infections with other pathogens, and should not be used as the sole basis for treatment or other patient management decisions. Negative results must be combined with clinical observations, patient history, and epidemiological information. The expected result is Negative. Fact Sheet for Patients: SugarRoll.be Fact Sheet for Healthcare Providers: https://www.woods-mathews.com/ This test is not yet approved or cleared by the [place] FDA and  has been authorized for detection and/or diagnosis of SARS-CoV-2 by FDA under an Emergency Use Authorization (EUA). This EUA will remain  in effect (meaning this test can be used) for the duration of the COVID-19 declaration under Section 56 4(b)(1) of the Act, 21 U.S.C. section 360bbb-3(b)(1), unless the  authorization is terminated or revoked sooner. Performed at [facility], [place] [address] [place], [place] [address]     IMAGING RESULTS:  I have personally reviewed the films ? Impression/Recommendation ? ?Group B streptococcus bacteremia- likely source rt ankle surgical wound Has underlying hardware from ORIF But hardware not felt.  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 ____________________________________________

## 2019-10-28 NOTE — Progress Notes (Addendum)
  PROGRESS NOTE    Becky Trevino  [id number] DOB: [birth year] DOA: 10/27/2019  PCP: [name], MD    LOS - 1    Patient admitted overnight with right lower extremity DVT after presenting with confusion and dyspnea.  She had ORIF for right ankle fracture on 09/02/19, discharged on Xarelto, but says she was not using it because she was on another medication.  Patient apparently was confused what the indications are for her medications.  Not SOB this AM.    On exam, the right ankle surgical site looks well healed without signs of infection.  Right leg with some edema, none on left.  Right anterior lower leg also warm to touch compared to left, but seems approx same diameter.  Some calf tenderness.  I have reviewed the full H&P by Dr. Sidney Ace in detail, and I agree with the assessment and plan as outlined therein.  In addition:  --hematology consult confirmed, Dr. Tasia Catchings to see --monitor respiratory status --keep on telemetry, monitor for tachycardia --continue full dose Lovenox for now, pending heme recommendations --PT eval    Addendum: blood cultures positive for group B strep.   Antibiotics changed to Unasyn to also cover possible anaerobes.    Ezekiel Slocumb, DO [facility]   If 7PM-7AM, please contact night-coverage [email or web address] 10/28/2019, 11:16 AM

## 2019-10-29 LAB — CBC
HCT: 34.2 % — ABNORMAL LOW (ref 36.0–46.0)
Hemoglobin: 11 g/dL — ABNORMAL LOW (ref 12.0–15.0)
MCH: 29.6 pg (ref 26.0–34.0)
MCHC: 32.2 g/dL (ref 30.0–36.0)
MCV: 91.9 fL (ref 80.0–100.0)
Platelets: 98 10*3/uL — ABNORMAL LOW (ref 150–400)
RBC: 3.72 MIL/uL — ABNORMAL LOW (ref 3.87–5.11)
RDW: 17.1 % — ABNORMAL HIGH (ref 11.5–15.5)
WBC: 9.2 10*3/uL (ref 4.0–10.5)
nRBC: 0 % (ref 0.0–0.2)

## 2019-10-29 LAB — GLUCOSE, CAPILLARY
Glucose-Capillary: 72 mg/dL (ref 70–99)
Glucose-Capillary: 82 mg/dL (ref 70–99)
Glucose-Capillary: 78 mg/dL (ref 70–99)
Glucose-Capillary: 74 mg/dL (ref 70–99)

## 2019-10-29 LAB — BASIC METABOLIC PANEL
Anion gap: 9 (ref 5–15)
BUN: 14 mg/dL (ref 8–23)
CO2: 23 mmol/L (ref 22–32)
Calcium: 8.5 mg/dL — ABNORMAL LOW (ref 8.9–10.3)
Chloride: 106 mmol/L (ref 98–111)
Creatinine, Ser: 1.04 mg/dL — ABNORMAL HIGH (ref 0.44–1.00)
GFR calc Af Amer: >60 mL/min (ref >60)
GFR calc non Af Amer: 53 mL/min — ABNORMAL LOW (ref >60)
Glucose, Bld: 88 mg/dL (ref 70–99)
Potassium: 3.4 mmol/L — ABNORMAL LOW (ref 3.5–5.1)
Sodium: 138 mmol/L (ref 135–145)

## 2019-10-29 MED ORDER — BRINZOLAMIDE 1 % OP SUSP
1.0000 [drp] | Freq: Three times a day (TID) | OPHTHALMIC | Status: DC
  Administered 2019-10-29 – 2019-11-07 (×28): 1 [drp] via OPHTHALMIC
  Filled 2019-10-29: qty 10

## 2019-10-29 MED ORDER — BRIMONIDINE TARTRATE 0.2 % OP SOLN
1.0000 [drp] | Freq: Three times a day (TID) | OPHTHALMIC | Status: DC
  Administered 2019-10-29 – 2019-11-07 (×28): 1 [drp] via OPHTHALMIC
  Filled 2019-10-29: qty 5

## 2019-10-29 MED ORDER — NETARSUDIL-LATANOPROST 0.02-0.005 % OP SOLN: 1.0000 [drp] | Freq: Every day | OPHTHALMIC | Status: DC

## 2019-10-29 NOTE — Progress Notes (Signed)
PROGRESS NOTE    Becky Trevino  [id number] DOB: 27-Jun-1946 DOA: 10/27/2019  PCP: [name], MD    LOS - 2   Brief Narrative:  74 year old female with history of type 2 diabetes, hypertension, hyperlipidemia, degenerative disc disease, right ankle fracture in December status post ORIF on 12/18.  Presented to the ED with confusion and shortness of breath on evening of 2/11.  She had been discharged on Lovenox for DVT prophylaxis which had been completed.  She has previously been on Xarelto for thrombophlebitis but was no longer taking this.  She was found to have right lower extremity DVT and associated cellulitis.  She was admitted and started on therapeutic Lovenox and IV antibiotics.  Hematology consulted and agree with therapeutic dose of Lovenox well admitted and DOAC on discharge preferably Eliquis but patient would like to resume Xarelto until she sees her cardiologist.  Patient has also had a wound associated with the ankle surgery, had wound VAC.  Podiatry saw and evaluated patient and reviewed x-rays which show fractures of healed and hardware intact, no signs of osteomyelitis.  Patient's blood cultures subsequently returned positive for group B streptococcus.  Antibiotics changed to Unasyn and infectious disease consulted.  Subjective 2/13: Patient seen and examined this morning at bedside.  She denies any acute complaints including fevers or chills, pain, nausea vomiting diarrhea or shortness of breath.  No acute events were reported overnight.  Assessment & Plan:   Active Problems:   Cellulitis of right lower extremity   Group B streptococcus bacteremia Right lower extremity cellulitis, mild, nonpurulent Suspected bacteremia secondary to cellulitis versus possible osteomyelitis associated with recent surgery and hardware, however overlying wound does not appear infected.   --Continue Unasyn --ID following --Follow-up repeat blood cultures --Wound cultures negative to  date, follow for final results --Will require a few weeks of IV antibiotic therapy  Right lower extremity DVT -provoked by recent right ankle surgery and secondary immobility --Continue therapeutic Lovenox while admitted --Xarelto on discharge  Right lower extremity wound status post right ankle ORIF Wound does not appear infected.  Please refer to Dr. Deborra Medina note of 2/12 --Continues to be nonweightbearing --X-rays show well-healed fracture and intact hardware --Possible CT scan of the right ankle to evaluate for osteomyelitis if no other source of infection to account for bacteremia  Mild AKI superimposed on CKD stage IIIa -likely due to mild dehydration, prerenal azotemia. AKI improved with IV hydration. 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  Continue doxy.   DVT prophylaxis: On full dose Lovenox   Code Status: Full Code  Family Communication: None at bedside  Disposition Plan: Pending further clinical improvement and negative repeat blood cultures.   Coming From home Exp DC Date: 2/15-16 earliest Barriers: Repeat blood cultures for bacteremia, potential further evaluation for osteomyelitis Medically Stable for Discharge?  No  Consultants:   Infectious disease  Podiatry  Hematology  Procedures:   None  Antimicrobials:   Rocephin 2/11 PM>> 2/12  Unasyn 2/12 >>> TBD   Objective: Vitals:   10/28/19 1135 10/28/19 1958 10/29/19 0444 10/29/19 0509  BP: (!) 101/59 (!) 128/59 (!) 97/49 Marland Kitchen)  107/56  Pulse: 75 81 80 76  Resp: 17 20 20    Temp: 98.4 F (36.9 C) 98.3 F (36.8  C) 98.3 F (36.8 C)   TempSrc: Oral Oral Oral   SpO2: 98% 94% 96%   Weight:      Height:        Intake/Output Summary (Last 24 hours) at 10/29/2019 0750 Last data filed at 10/29/2019 0300 Gross per 24 hour  Intake 2561.31 ml  Output 800 ml  Net 1761.31 ml   Filed Weights   10/27/19 2108 10/28/19 0053  Weight: 114.8 kg 116.2 kg    Examination:  General exam: awake, alert, no acute distress, obese HEENT: moist mucus membranes, hearing grossly normal  Respiratory system: CTAB, no wheezes, rales or rhonchi, normal respiratory effort. Cardiovascular system: normal S1/S2, RRR, no JVD, murmurs, rubs, gallops, no pedal edema.   Central nervous system: A&O x4. no gross focal neurologic deficits, normal speech Extremities: moves all, right foot and ankle dressing clean dry and intact, normal tone Psychiatry: normal mood, congruent affect, judgement and insight appear normal    Data Reviewed: I have personally reviewed following labs and imaging studies  CBC: Recent Labs  Lab 10/27/19 2118 10/29/19 0527  WBC 16.7* 9.2  NEUTROABS 15.3*  --   HGB 12.9 11.0*  HCT 40.9 34.2*  MCV 92.3 91.9  PLT 131* 98*   Basic Metabolic Panel: Recent Labs  Lab 10/27/19 2118 10/29/19 0527  NA 134* 138  K 4.0 3.4*  CL 100 106  CO2 23 23  GLUCOSE 121* 88  BUN 18 14  CREATININE 1.27* 1.04*  CALCIUM 9.6 8.5*   GFR: Estimated Creatinine Clearance: 66.7 mL/min (A) (by C-G formula based on SCr of 1.04 mg/dL (H)). Liver Function Tests: Recent Labs  Lab 10/27/19 2118  AST 19  ALT 16  ALKPHOS 121  BILITOT 0.8  PROT 8.5*  ALBUMIN 3.9   No results for input(s): LIPASE, AMYLASE in the last 168 hours. No results for input(s): AMMONIA in the last 168 hours. Coagulation Profile: Recent Labs  Lab 10/28/19 0623  INR 1.2   Cardiac Enzymes: No results for input(s): CKTOTAL, CKMB, CKMBINDEX, TROPONINI in the last 168 hours. BNP (last 3 results) No results for input(s): PROBNP in the  last 8760 hours. HbA1C: Recent Labs    10/28/19 0623  HGBA1C 5.5   CBG: Recent Labs  Lab 10/28/19 0803 10/28/19 1136 10/28/19 1654 10/28/19 2108  GLUCAP 73 80 72 74   Lipid Profile: No results for input(s): CHOL, HDL, LDLCALC, TRIG, CHOLHDL, LDLDIRECT in the last 72 hours. Thyroid Function Tests: No results for input(s): TSH, T4TOTAL, FREET4, T3FREE, THYROIDAB in the last 72 hours. Anemia Panel: No results for input(s): VITAMINB12, FOLATE, FERRITIN, TIBC, IRON, RETICCTPCT in the last 72 hours. Sepsis Labs: Recent Labs  Lab 10/27/19 2118 10/27/19 2323  LATICACIDVEN 1.8 1.7    Recent Results (from the past 240 hour(s))  Culture, blood (routine x 2)     Status: None (Preliminary result)   Collection Time: 10/27/19  9:18 PM   Specimen: BLOOD  Result Value Ref Range Status   Specimen Description BLOOD LEFT ANTECUBITAL  Final   Special Requests   Final    BOTTLES DRAWN AEROBIC AND ANAEROBIC Blood Culture adequate volume   Culture  Setup Time   Final    Organism ID to follow GRAM POSITIVE COCCI IN BOTH AEROBIC AND ANAEROBIC BOTTLES CRITICAL RESULT CALLED TO, READ BACK BY AND VERIFIED WITH: KRISTEN MERRILL AT 1005  10/28/19.PMF Performed at [facility], [address] [place], [place] [address]    Culture [facility] POSITIVE COCCI  Final   Report Status PENDING  Incomplete  Blood Culture ID Panel (Reflexed)     Status: Abnormal   Collection Time: 10/27/19  9:18 PM  Result Value Ref Range Status   Enterococcus species NOT DETECTED NOT DETECTED Final   Listeria monocytogenes NOT DETECTED NOT DETECTED Final   Staphylococcus species NOT DETECTED NOT DETECTED Final   Staphylococcus aureus (BCID) NOT DETECTED NOT DETECTED Final   Streptococcus species DETECTED (A) NOT DETECTED Final    Comment: CRITICAL RESULT CALLED TO, READ BACK BY AND VERIFIED WITH: KRISTEN MERRILL AT [id number] 10/28/19.PMF    Streptococcus agalactiae DETECTED (A) NOT DETECTED Final    Comment: CRITICAL  RESULT CALLED TO, READ BACK BY AND VERIFIED WITH: KRISTEN MERRILL AT 1005 10/28/19.PMF    Streptococcus pneumoniae NOT DETECTED NOT DETECTED Final   Streptococcus pyogenes NOT DETECTED NOT DETECTED Final   Acinetobacter baumannii NOT DETECTED NOT DETECTED Final   Enterobacteriaceae species NOT DETECTED NOT DETECTED Final   Enterobacter cloacae complex NOT DETECTED NOT DETECTED Final   Escherichia coli NOT DETECTED NOT DETECTED Final   Klebsiella oxytoca NOT DETECTED NOT DETECTED Final   Klebsiella pneumoniae NOT DETECTED NOT DETECTED Final   Proteus species NOT DETECTED NOT DETECTED Final   Serratia marcescens NOT DETECTED NOT DETECTED Final   Haemophilus influenzae NOT DETECTED NOT DETECTED Final   Neisseria meningitidis NOT DETECTED NOT DETECTED Final   Pseudomonas aeruginosa NOT DETECTED NOT DETECTED Final   Candida albicans NOT DETECTED NOT DETECTED Final   Candida glabrata NOT DETECTED NOT DETECTED Final   Candida krusei NOT DETECTED NOT DETECTED Final   Candida parapsilosis NOT DETECTED NOT DETECTED Final   Candida tropicalis NOT DETECTED NOT DETECTED Final    Comment: Performed at [facility], [address] [place], [place] [address]  Culture, blood (routine x 2)     Status: None (Preliminary result)   Collection Time: 10/27/19  9:23 PM   Specimen: BLOOD  Result Value Ref Range Status   Specimen Description BLOOD BLOOD LEFT HAND  Final   Special Requests   Final    BOTTLES DRAWN AEROBIC AND ANAEROBIC Blood Culture adequate volume   Culture  Setup Time   Final    GRAM POSITIVE COCCI IN BOTH AEROBIC AND ANAEROBIC BOTTLES CRITICAL RESULT CALLED TO, READ BACK BY AND VERIFIED WITH: KRISTEN MERRILL AT 1005 10/28/19.PMF Performed at [facility], [address] [place], [place] [address]    Culture [facility] POSITIVE COCCI  Final   Report Status PENDING  Incomplete  SARS CORONAVIRUS 2 (TAT 6-24 HRS) Nasopharyngeal Nasopharyngeal Swab     Status: None    Collection Time: 10/27/19 11:07 PM   Specimen: Nasopharyngeal Swab  Result Value Ref Range Status   SARS Coronavirus 2 NEGATIVE NEGATIVE Final    Comment: (NOTE) SARS-CoV-2 target nucleic acids are NOT DETECTED. The SARS-CoV-2 RNA is generally detectable in upper and lower respiratory specimens during the acute phase of infection. Negative results do not preclude SARS-CoV-2 infection, do not rule out co-infections with other pathogens, and should not be used as the sole basis for treatment or other patient management decisions. Negative results must be combined with clinical observations, patient history, and epidemiological information. The expected result is Negative. Fact Sheet for Patients: SugarRoll.be Fact Sheet for Healthcare Providers: https://www.woods-mathews.com/ This test is not yet approved or cleared by the [place]  FDA and  has been authorized for detection and/or diagnosis of SARS-CoV-2 by FDA under an Emergency Use Authorization (EUA). This EUA will remain  in effect (meaning this test can be used) for the duration of the COVID-19 declaration under Section 56 4(b)(1) of the Act, 21 U.S.C. section 360bbb-3(b)(1), unless the authorization is terminated or revoked sooner. Performed at [facility], [place] [address], [place], [place] [address]   Aerobic/Anaerobic Culture (surgical/deep wound)     Status: None (Preliminary result)   Collection Time: 10/28/19 12:30 PM   Specimen: Wound  Result Value Ref Range Status   Specimen Description   Final    WOUND LEG RIGHT Performed at [facility], [address] [place], [place] [address]    Special Requests   Final    Normal Performed at [facility], [address] [place], [place] [address]    Gram Stain   Final    NO WBC SEEN MODERATE GRAM POSITIVE COCCI MODERATE GRAM POSITIVE RODS Performed at [facility], [place] [address]  [place], [place] [address]    Culture PENDING  Incomplete   Report Status PENDING  Incomplete         Radiology Studies: DG Ankle 2 Views Right  Result Date: 10/28/2019 CLINICAL DATA:  The patient suffered a distal fibular fracture due to a fall in December, 2020. The patient underwent ORIF 09/02/2019 and now has developed swelling, warmth and tenderness about the surgical site. EXAM: RIGHT ANKLE - 2 VIEW COMPARISON:  Intraoperative imaging for fracture fixation 09/02/2019. FINDINGS: Plate and screw fixation of a distal fibular fracture is identified. Position and alignment are anatomic. Subtle lucency about fixation screws is most notable about the 3rd-6th screws from the top and worrisome for infection or loosening. Soft tissues about the ankle are swollen. No soft tissue gas is identified. Vac drain is in place. IMPRESSION: Status post fixation of a distal fibular fracture. There is subtle lucency about multiple fixation screws worrisome for loosening or infection. Soft tissue swelling about the ankle without soft tissue gas. Electronically Signed   By: Inge Rise M.D.   On: 10/28/2019 09:29   CT Head Wo Contrast  Result Date: 10/27/2019 CLINICAL DATA:  Encephalopathy EXAM: CT HEAD WITHOUT CONTRAST TECHNIQUE: Contiguous axial images were obtained from the base of the skull through the vertex without intravenous contrast. COMPARISON:  Head CT 08/07/2019 FINDINGS: Brain: There is no mass, hemorrhage or extra-axial collection. The size and configuration of the ventricles and extra-axial CSF spaces are normal. The brain parenchyma is normal, without acute or chronic infarction. Vascular: No abnormal hyperdensity of the major intracranial arteries or dural venous sinuses. No intracranial atherosclerosis. Skull: Craniocervical fusion. Sinuses/Orbits: No fluid levels or advanced mucosal thickening of the visualized paranasal sinuses. No mastoid or middle ear effusion. The orbits are normal. IMPRESSION:  Normal brain. Electronically Signed   By: Ulyses Jarred M.D.   On: 10/27/2019 22:18   US Venous Img Lower Unilateral Right  Result Date: 10/27/2019 CLINICAL DATA:  Erythema and edema EXAM: Right LOWER EXTREMITY VENOUS DOPPLER ULTRASOUND TECHNIQUE: Gray-scale sonography with graded compression, as well as color Doppler and duplex ultrasound were performed to evaluate the lower extremity deep venous systems from the level of the common femoral vein and including the common femoral, femoral, profunda femoral, popliteal and calf veins including the posterior tibial, peroneal and gastrocnemius veins when visible. The superficial great saphenous vein was also interrogated. Spectral Doppler was utilized to evaluate flow  at rest and with distal augmentation maneuvers in the common femoral, femoral and popliteal veins. COMPARISON:  None FINDINGS: Contralateral Common Femoral Vein: Respiratory phasicity is normal and symmetric with the symptomatic side. No evidence of thrombus. Normal compressibility. Common Femoral Vein: No evidence of thrombus. Normal compressibility, respiratory phasicity and response to augmentation. Saphenofemoral Junction: There is occlusive thrombus at the junction of the saphenous vein with the common femoral vein. Profunda Femoral Vein: No evidence of thrombus. Normal compressibility and flow on color Doppler imaging. Femoral Vein: No evidence of thrombus. Normal compressibility, respiratory phasicity and response to augmentation. Popliteal Vein: There is occlusive thrombus within the right popliteal vein. Calf Veins: No evidence of thrombus. Normal compressibility and flow on color Doppler imaging. Superficial Great Saphenous Vein: No evidence of thrombus. Normal compressibility. Venous Reflux:  None. Other Findings:  None. IMPRESSION: 1. Deep venous thrombus within the right popliteal vein, with superficial thrombus seen at the saphenofemoral junction. These results will be called to the  ordering clinician or representative by the Radiologist Assistant, and communication documented in the PACS or zVision Dashboard. Electronically Signed   By: Randa Ngo M.D.   On: 10/27/2019 23:04   DG Chest Port 1 View  Result Date: 10/27/2019 CLINICAL DATA:  Altered level of consciousness, sepsis EXAM: PORTABLE CHEST 1 VIEW COMPARISON:  08/07/2019 FINDINGS: Single frontal view of the chest was performed. Lung volumes are diminished, with crowding of the pulmonary vasculature. No definite airspace disease, effusion, or pneumothorax. The cardiac silhouette is stable. IMPRESSION: 1. Low lung volumes with crowding of the pulmonary vasculature. 2. No acute airspace disease. Electronically Signed   By: Randa Ngo M.D.   On: 10/27/2019 22:40        Scheduled Meds:  atorvastatin  40 mg Oral QPM   calcium-vitamin D  1 tablet Oral BID   enoxaparin (LOVENOX) injection  115 mg Subcutaneous Q12H   fluticasone  2 spray Each Nare Daily   insulin aspart  0-15 Units Subcutaneous TID PC & HS   insulin aspart  3 Units Subcutaneous TID WC   magnesium oxide  400 mg Oral Daily   metoprolol succinate  100 mg Oral QHS   midodrine  5 mg Oral TID PC   mirabegron ER  25 mg Oral Daily   Continuous Infusions:  sodium chloride 100 mL/hr at 10/28/19 2009   ampicillin-sulbactam (UNASYN) IV 3 g (10/29/19 0516)     LOS: 2 days    Time spent: 30 to 35 minutes     Ezekiel Slocumb, DO [facility]   If 7PM-7AM, please contact night-coverage [email or web address] 10/29/2019, 7:50 AM

## 2019-10-30 ENCOUNTER — Inpatient Hospital Stay: Payer: BC Managed Care – PPO

## 2019-10-30 ENCOUNTER — Encounter: Payer: Self-pay | Admitting: Family Medicine

## 2019-10-30 DIAGNOSIS — I82431 Acute embolism and thrombosis of right popliteal vein: Secondary | ICD-10-CM

## 2019-10-30 DIAGNOSIS — B9562 Methicillin resistant Staphylococcus aureus infection as the cause of diseases classified elsewhere: Secondary | ICD-10-CM | POA: Diagnosis present

## 2019-10-30 DIAGNOSIS — I82401 Acute embolism and thrombosis of unspecified deep veins of right lower extremity: Secondary | ICD-10-CM | POA: Diagnosis present

## 2019-10-30 DIAGNOSIS — Z86718 Personal history of other venous thrombosis and embolism: Secondary | ICD-10-CM | POA: Diagnosis present

## 2019-10-30 DIAGNOSIS — I87099 Postthrombotic syndrome with other complications of unspecified lower extremity: Secondary | ICD-10-CM | POA: Diagnosis present

## 2019-10-30 DIAGNOSIS — R7881 Bacteremia: Secondary | ICD-10-CM | POA: Diagnosis present

## 2019-10-30 DIAGNOSIS — E1140 Type 2 diabetes mellitus with diabetic neuropathy, unspecified: Secondary | ICD-10-CM

## 2019-10-30 DIAGNOSIS — B951 Streptococcus, group B, as the cause of diseases classified elsewhere: Secondary | ICD-10-CM

## 2019-10-30 LAB — BASIC METABOLIC PANEL
Anion gap: 7 (ref 5–15)
BUN: 12 mg/dL (ref 8–23)
CO2: 27 mmol/L (ref 22–32)
Calcium: 8.8 mg/dL — ABNORMAL LOW (ref 8.9–10.3)
Chloride: 105 mmol/L (ref 98–111)
Creatinine, Ser: 1.04 mg/dL — ABNORMAL HIGH (ref 0.44–1.00)
GFR calc Af Amer: >60 mL/min (ref >60)
GFR calc non Af Amer: 53 mL/min — ABNORMAL LOW (ref >60)
Glucose, Bld: 90 mg/dL (ref 70–99)
Potassium: 3.9 mmol/L (ref 3.5–5.1)
Sodium: 139 mmol/L (ref 135–145)

## 2019-10-30 LAB — CULTURE, BLOOD (ROUTINE X 2)
Special Requests: ADEQUATE
Special Requests: ADEQUATE

## 2019-10-30 LAB — CBC
HCT: 34.7 % — ABNORMAL LOW (ref 36.0–46.0)
Hemoglobin: 11 g/dL — ABNORMAL LOW (ref 12.0–15.0)
MCH: 29.6 pg (ref 26.0–34.0)
MCHC: 31.7 g/dL (ref 30.0–36.0)
MCV: 93.3 fL (ref 80.0–100.0)
Platelets: 118 10*3/uL — ABNORMAL LOW (ref 150–400)
RBC: 3.72 MIL/uL — ABNORMAL LOW (ref 3.87–5.11)
RDW: 16.9 % — ABNORMAL HIGH (ref 11.5–15.5)
WBC: 7.3 10*3/uL (ref 4.0–10.5)
nRBC: 0 % (ref 0.0–0.2)

## 2019-10-30 LAB — GLUCOSE, CAPILLARY
Glucose-Capillary: 82 mg/dL (ref 70–99)
Glucose-Capillary: 79 mg/dL (ref 70–99)
Glucose-Capillary: 96 mg/dL (ref 70–99)

## 2019-10-30 MED ORDER — IOHEXOL 300 MG/ML  SOLN
100.0000 mL | Freq: Once | INTRAMUSCULAR | Status: AC | PRN
  Administered 2019-10-30: 19:00:00 100 mL via INTRAVENOUS

## 2019-10-30 NOTE — Progress Notes (Signed)
Hematology/Oncology Progress Note [facility] [phone] [phone]  Patient Care Team: [name], MD as PCP - General (Family Medicine) Yolonda Kida, MD as Consulting Physician (Cardiology) Albertine Patricia, DPM as Consulting Physician (Podiatry) Lorelee Cover., MD as Consulting Physician (Ophthalmology) Hessie Knows, MD as Consulting Physician (Orthopedic Surgery) Murrell Redden, MD as Consulting Physician (Urology) Cira Servant, DO as Consulting Physician (Rehabilitation) [name], MD as Consulting Physician (Infectious Diseases) Neldon Labella, RN as Case Manager   Name of the patient: Becky Trevino  [id number]  [birth year]  Date of visit: 10/30/19   INTERVAL HISTORY-  Patient is afebrile.  No acute events overnight.  She denies any new complaints.  Right ankle pain has improved. 10/27/2019 blood culture was positive for Streptococcus agalactiae.  Review of systems- Review of Systems  Constitutional: Negative for appetite change, chills, fatigue and fever.  HENT:   Negative for hearing loss and voice change.   Eyes: Negative for eye problems.  Respiratory: Negative for chest tightness and cough.   Cardiovascular: Negative for chest pain.  Gastrointestinal: Negative for abdominal distention, abdominal pain and blood in stool.  Endocrine: Negative for hot flashes.  Genitourinary: Negative for difficulty urinating and frequency.   Musculoskeletal: Negative for arthralgias.  Skin: Negative for itching and rash.  Neurological: Negative for extremity weakness.  Hematological: Negative for adenopathy.  Psychiatric/Behavioral: Negative for confusion.    Allergies  Allergen Reactions  . Lisinopril Swelling    Patient Active Problem List   Diagnosis Date Noted  . Bacteremia due to group B Streptococcus 10/30/2019  . Acute deep vein thrombosis (DVT) of right lower extremity ([place]) 10/30/2019  .  Cellulitis of right lower extremity 10/27/2019  . Hematoma   . Syncope 08/07/2019  . Anemia 08/07/2019  . Chronic infection of right knee ([place]) 11/25/2018  . Infection and inflammatory reaction due to internal joint prosthesis, subsequent encounter 01/25/2018  . Infection of total right knee replacement ([place]) 11/19/2017  . Type 2 diabetes mellitus with diabetic neuropathy, without long-term current use of insulin ([place]) 11/06/2016  . Primary osteoarthritis of knee 03/11/2016  . Primary osteoarthritis of left hip 12/11/2015  . Benign essential HTN 04/21/2015  . Edema leg 04/21/2015  . Chronic kidney disease (CKD), stage III (moderate) 04/21/2015  . Chronic venous insufficiency 04/21/2015  . DDD (degenerative disc disease), cervical 04/21/2015  . Diabetes mellitus with renal manifestation ([place]) 04/21/2015  . Dyslipidemia 04/21/2015  . Bladder cystocele 04/21/2015  . Urinary incontinence 04/21/2015  . Leg varices 04/21/2015  . Insomnia 04/21/2015  . Eczema intertrigo 04/21/2015  . Obstructive apnea 04/21/2015  . Menopausal and perimenopausal disorder 04/21/2015  . Supraventricular premature beats 04/21/2015  . Osteoarthritis of multiple joints 04/21/2015  . H/O Spinal surgery 11/14/2013  . Detrusor muscle hypertonia 11/07/2013     Past Medical History:  Diagnosis Date  . Cervical spondylosis   . Chronic kidney disease    Stage 3  . DDD (degenerative disc disease), cervical    [facility]  . Diabetes mellitus without complication ([place])   . Diverticulosis   . Hyperlipidemia   . Hypertension   . Intertrigo   . Leg cramps   . Leg varices   . Obesity   . OSA (obstructive sleep apnea)   . Symptomatic menopausal or female climacteric states   . Syncope      Past Surgical History:  Procedure Laterality Date  . ABDOMINAL HYSTERECTOMY  1993   Total  . BUNIONECTOMY Bilateral 1993  .  I & D KNEE WITH POLY EXCHANGE Right 11/19/2017   Procedure: RIGHT KNEE POLY EXCHANGE  WITH IRRIGATION AND DEBRIDEMENT;  Surgeon: Hessie Knows, MD;  Location: [facility] ORS;  Service: Orthopedics;  Laterality: Right;  . JOINT REPLACEMENT     bilateral knee  . LAMINECTOMY  11/14/2013    Cervical Fusion , [facility], Dr. Delilah Shan  . LASER ABLATION Bilateral 07/29/2012   Dr. Lucky Cowboy  . ORIF ANKLE FRACTURE Right 09/02/2019   Procedure: OPEN REDUCTION INTERNAL FIXATION (ORIF) ANKLE FRACTURE BIMALLEOLAR;  Surgeon: [name], DPM;  Location: [facility] ORS;  Service: Podiatry;  Laterality: Right;  . REPLACEMENT TOTAL KNEE Left 03/2010   Dr. Rudene Christians  . TOTAL HIP ARTHROPLASTY Left 12/11/2015   Procedure: TOTAL HIP ARTHROPLASTY ANTERIOR APPROACH;  Surgeon: Hessie Knows, MD;  Location: [facility] ORS;  Service: Orthopedics;  Laterality: Left;  . TOTAL KNEE ARTHROPLASTY Right 03/11/2016   Procedure: TOTAL KNEE ARTHROPLASTY;  Surgeon: Hessie Knows, MD;  Location: [facility] ORS;  Service: Orthopedics;  Laterality: Right;    Social History   Socioeconomic History  . Marital status: Married    Spouse name: Not on file  . Number of children: 1  . Years of education: Not on file  . Highest education level: Not on file  Occupational History  . Not on file  Tobacco Use  . Smoking status: Never Smoker  . Smokeless tobacco: Never Used  Substance and Sexual Activity  . Alcohol use: No    Alcohol/week: 0.0 standard drinks  . Drug use: No  . Sexual activity: Yes    Partners: Male  Other Topics Concern  . Not on file  Social History Narrative   Married, one adopted grown child    Social Determinants of Health   Financial Resource Strain: Low Risk   . Difficulty of Paying Living Expenses: Not hard at all  Food Insecurity: No Food Insecurity  . Worried About Charity fundraiser in the Last Year: Never true  . Ran Out of Food in the Last Year: Never true  Transportation Needs: No Transportation Needs  . Lack of Transportation (Medical): No  . Lack of Transportation (Non-Medical): No  Physical Activity:  Insufficiently Active  . Days of Exercise per Week: 3 days  . Minutes of Exercise per Session: 30 min  Stress: No Stress Concern Present  . Feeling of Stress : Not at all  Social Connections: Slightly Isolated  . Frequency of Communication with Friends and Family: More than three times a week  . Frequency of Social Gatherings with Friends and Family: More than three times a week  . Attends Religious Services: More than 4 times per year  . Active Member of Clubs or Organizations: No  . Attends Archivist Meetings: Never  . Marital Status: Married  Human resources officer Violence: Not At Risk  . Fear of Current or Ex-Partner: No  . Emotionally Abused: No  . Physically Abused: No  . Sexually Abused: No     Family History  Problem Relation Age of Onset  . Diabetes Mother   . Hyperlipidemia Mother   . Hypertension Mother   . Diabetes Father   . Hyperlipidemia Father   . Hypertension Father   . Obesity Father   . Hypertension Sister   . Hyperlipidemia Sister   . Hyperlipidemia Sister   . Breast cancer Neg Hx      Current Facility-Administered Medications:  .  0.9 %  sodium chloride infusion, , Intravenous, Continuous, Mansy, Arvella Merles, MD, Last  Rate: 100 mL/hr at 10/30/19 1743, New Bag at 10/30/19 1743 .  acetaminophen (TYLENOL) tablet 650 mg, 650 mg, Oral, Q6H PRN, 650 mg at 10/28/19 2017 **OR** acetaminophen (TYLENOL) suppository 650 mg, 650 mg, Rectal, Q6H PRN, Mansy, Jan A, MD .  Ampicillin-Sulbactam (UNASYN) 3 g in sodium chloride 0.9 % 100 mL IVPB, 3 g, Intravenous, Q6H, Dallie Piles, RPH, Last Rate: 200 mL/hr at 10/30/19 1749, 3 g at 10/30/19 1749 .  atorvastatin (LIPITOR) tablet 40 mg, 40 mg, Oral, QPM, Mansy, Jan A, MD, 40 mg at 10/30/19 1740 .  brimonidine (ALPHAGAN) 0.2 % ophthalmic solution 1 drop, 1 drop, Both Eyes, TID, 1 drop at 10/30/19 1738 **AND** brinzolamide (AZOPT) 1 % ophthalmic suspension 1 drop, 1 drop, Both Eyes, TID, Nicole Kindred A, DO, 1 drop at  10/30/19 1738 .  calcium-vitamin D (OSCAL WITH D) 500-200 MG-UNIT per tablet 1 tablet, 1 tablet, Oral, BID, Mansy, Jan A, MD, 1 tablet at 10/30/19 1019 .  enoxaparin (LOVENOX) injection 115 mg, 115 mg, Subcutaneous, Q12H, Mansy, Jan A, MD, 115 mg at 10/30/19 1740 .  fluticasone (FLONASE) 50 MCG/ACT nasal spray 2 spray, 2 spray, Each Nare, Daily, Mansy, Jan A, MD, 2 spray at 10/30/19 1020 .  iohexol (OMNIPAQUE) 300 MG/ML solution 100 mL, 100 mL, Intravenous, Once PRN, [name], DPM .  magnesium hydroxide (MILK OF MAGNESIA) suspension 30 mL, 30 mL, Oral, Daily PRN, Mansy, Jan A, MD .  magnesium oxide (MAG-OX) tablet 400 mg, 400 mg, Oral, Daily, Mansy, Jan A, MD, 400 mg at 10/30/19 1020 .  metoprolol succinate (TOPROL-XL) 24 hr tablet 100 mg, 100 mg, Oral, QHS, Mansy, Jan A, MD, 100 mg at 10/29/19 2221 .  midodrine (PROAMATINE) tablet 5 mg, 5 mg, Oral, TID PC, Mansy, Jan A, MD, 5 mg at 10/30/19 1740 .  mirabegron ER (MYRBETRIQ) tablet 25 mg, 25 mg, Oral, Daily, Mansy, Jan A, MD, 25 mg at 10/30/19 1020 .  morphine 2 MG/ML injection 2 mg, 2 mg, Intravenous, Q4H PRN, Mansy, Jan A, MD .  ondansetron (ZOFRAN) tablet 4 mg, 4 mg, Oral, Q6H PRN **OR** ondansetron (ZOFRAN) injection 4 mg, 4 mg, Intravenous, Q6H PRN, Mansy, Jan A, MD .  traZODone (DESYREL) tablet 25 mg, 25 mg, Oral, QHS PRN, Mansy, Arvella Merles, MD   Physical exam:  Vitals:   10/29/19 1150 10/29/19 1934 10/30/19 0456 10/30/19 1147  BP: 128/69 (!) 112/46 (!) 116/54 136/70  Pulse: 73 77 79 75  Resp: 16 20 18    Temp: 98.2 F (36.8 C) 97.9 F (36.6 C) 97.9 F (36.6 C) 97.7 F (36.5 C)  TempSrc: Oral Oral Oral Oral  SpO2: 98% 97% 97% 100%  Weight:      Height:       Physical Exam  Constitutional: She is oriented to person, place, and time. No distress.  HENT:  Head: Normocephalic and atraumatic.  Mouth/Throat: No oropharyngeal exudate.  Eyes: Pupils are equal, round, and reactive to light. EOM are normal. No scleral icterus.    Cardiovascular: Normal rate and regular rhythm.  No murmur heard. Pulmonary/Chest: Effort normal. No respiratory distress. She has no rales. She exhibits no tenderness.  Abdominal: Soft. She exhibits no distension. There is no abdominal tenderness.  Musculoskeletal:        General: No edema. Normal range of motion.     Cervical back: Normal range of motion and neck supple.     Comments: Right lower extremity dressing dry and clean.  Neurological: She is alert  and oriented to person, place, and time. No cranial nerve deficit. She exhibits normal muscle tone. Coordination normal.  Skin: Skin is warm and dry. She is not diaphoretic. No erythema.  Psychiatric: Affect normal.       CMP Latest Ref Rng & Units 10/30/2019  Glucose 70 - 99 mg/dL 90  BUN 8 - 23 mg/dL 12  Creatinine 0.44 - 1.00 mg/dL 1.04(H)  Sodium 135 - 145 mmol/L 139  Potassium 3.5 - 5.1 mmol/L 3.9  Chloride 98 - 111 mmol/L 105  CO2 22 - 32 mmol/L 27  Calcium 8.9 - 10.3 mg/dL 8.8(L)  Total Protein 6.5 - 8.1 g/dL -  Total Bilirubin 0.3 - 1.2 mg/dL -  Alkaline Phos 38 - 126 U/L -  AST 15 - 41 U/L -  ALT 0 - 44 U/L -   CBC Latest Ref Rng & Units 10/30/2019  WBC 4.0 - 10.5 K/uL 7.3  Hemoglobin 12.0 - 15.0 g/dL 11.0(L)  Hematocrit 36.0 - 46.0 % 34.7(L)  Platelets 150 - 400 K/uL 118(L)   RADIOGRAPHIC STUDIES: I have personally reviewed the radiological images as listed and agreed with the findings in the report. DG Ankle 2 Views Right  Result Date: 10/28/2019 CLINICAL DATA:  The patient suffered a distal fibular fracture due to a fall in December, 2020. The patient underwent ORIF 09/02/2019 and now has developed swelling, warmth and tenderness about the surgical site. EXAM: RIGHT ANKLE - 2 VIEW COMPARISON:  Intraoperative imaging for fracture fixation 09/02/2019. FINDINGS: Plate and screw fixation of a distal fibular fracture is identified. Position and alignment are anatomic. Subtle lucency about fixation screws is most  notable about the 3rd-6th screws from the top and worrisome for infection or loosening. Soft tissues about the ankle are swollen. No soft tissue gas is identified. Vac drain is in place. IMPRESSION: Status post fixation of a distal fibular fracture. There is subtle lucency about multiple fixation screws worrisome for loosening or infection. Soft tissue swelling about the ankle without soft tissue gas. Electronically Signed   By: Inge Rise M.D.   On: 10/28/2019 09:29   CT Head Wo Contrast  Result Date: 10/27/2019 CLINICAL DATA:  Encephalopathy EXAM: CT HEAD WITHOUT CONTRAST TECHNIQUE: Contiguous axial images were obtained from the base of the skull through the vertex without intravenous contrast. COMPARISON:  Head CT 08/07/2019 FINDINGS: Brain: There is no mass, hemorrhage or extra-axial collection. The size and configuration of the ventricles and extra-axial CSF spaces are normal. The brain parenchyma is normal, without acute or chronic infarction. Vascular: No abnormal hyperdensity of the major intracranial arteries or dural venous sinuses. No intracranial atherosclerosis. Skull: Craniocervical fusion. Sinuses/Orbits: No fluid levels or advanced mucosal thickening of the visualized paranasal sinuses. No mastoid or middle ear effusion. The orbits are normal. IMPRESSION: Normal brain. Electronically Signed   By: Ulyses Jarred M.D.   On: 10/27/2019 22:18   US Venous Img Lower Unilateral Right  Result Date: 10/27/2019 CLINICAL DATA:  Erythema and edema EXAM: Right LOWER EXTREMITY VENOUS DOPPLER ULTRASOUND TECHNIQUE: Gray-scale sonography with graded compression, as well as color Doppler and duplex ultrasound were performed to evaluate the lower extremity deep venous systems from the level of the common femoral vein and including the common femoral, femoral, profunda femoral, popliteal and calf veins including the posterior tibial, peroneal and gastrocnemius veins when visible. The superficial great  saphenous vein was also interrogated. Spectral Doppler was utilized to evaluate flow at rest and with distal augmentation maneuvers in the common femoral,  femoral and popliteal veins. COMPARISON:  None FINDINGS: Contralateral Common Femoral Vein: Respiratory phasicity is normal and symmetric with the symptomatic side. No evidence of thrombus. Normal compressibility. Common Femoral Vein: No evidence of thrombus. Normal compressibility, respiratory phasicity and response to augmentation. Saphenofemoral Junction: There is occlusive thrombus at the junction of the saphenous vein with the common femoral vein. Profunda Femoral Vein: No evidence of thrombus. Normal compressibility and flow on color Doppler imaging. Femoral Vein: No evidence of thrombus. Normal compressibility, respiratory phasicity and response to augmentation. Popliteal Vein: There is occlusive thrombus within the right popliteal vein. Calf Veins: No evidence of thrombus. Normal compressibility and flow on color Doppler imaging. Superficial Great Saphenous Vein: No evidence of thrombus. Normal compressibility. Venous Reflux:  None. Other Findings:  None. IMPRESSION: 1. Deep venous thrombus within the right popliteal vein, with superficial thrombus seen at the saphenofemoral junction. These results will be called to the ordering clinician or representative by the Radiologist Assistant, and communication documented in the PACS or zVision Dashboard. Electronically Signed   By: Randa Ngo M.D.   On: 10/27/2019 23:04   DG Chest Port 1 View  Result Date: 10/27/2019 CLINICAL DATA:  Altered level of consciousness, sepsis EXAM: PORTABLE CHEST 1 VIEW COMPARISON:  08/07/2019 FINDINGS: Single frontal view of the chest was performed. Lung volumes are diminished, with crowding of the pulmonary vasculature. No definite airspace disease, effusion, or pneumothorax. The cardiac silhouette is stable. IMPRESSION: 1. Low lung volumes with crowding of the pulmonary  vasculature. 2. No acute airspace disease. Electronically Signed   By: Randa Ngo M.D.   On: 10/27/2019 22:40    Assessment and plan-   #Provoked right lower extremity DVT, secondary to wound cellulitis and recent ankle surgery. Continue therapeutic Lovenox for now.  Xarelto at discharge.  Outpatient follow-up with me for management of anticoagulation.    #Right lower extremity wound cellulitis, defer to surgery/ID for need of CT of right ankle for evaluation of osteomyelitis. #Bacteremia.  Continue IV Unasyn.  ID is on board. #CKD, avoid nephrotoxins.   Thank you for allowing me to participate in the care of this patient.   [name], MD, PhD Hematology Oncology [facility] Pager- [phone] 10/30/2019

## 2019-10-30 NOTE — Progress Notes (Signed)
PROGRESS NOTE    Becky Trevino  [id number] DOB: 1945-11-13 DOA: 10/27/2019  PCP: [name], MD    LOS - 3   Brief Narrative:  74 year old female with history of type 2 diabetes, hypertension, hyperlipidemia, degenerative disc disease, right ankle fracture in December status post ORIF on 12/18.  Presented to the ED with confusion and shortness of breath on evening of 2/11.  She had been discharged on Lovenox for DVT prophylaxis which had been completed.  She has previously been on Xarelto for thrombophlebitis but was no longer taking this.  She was found to have right lower extremity DVT and associated cellulitis.  She was admitted and started on therapeutic Lovenox and IV antibiotics.  Hematology consulted and agree with therapeutic dose of Lovenox well admitted and DOAC on discharge preferably Eliquis but patient would like to resume Xarelto until she sees her cardiologist.  Patient has also had a wound associated with the ankle surgery, had wound VAC.  Podiatry saw and evaluated patient and reviewed x-rays which show fractures of healed and hardware intact, no signs of osteomyelitis.  Patient's blood cultures subsequently returned positive for group B streptococcus.  Antibiotics changed to Unasyn and infectious disease consulted.  On Unasyn pending final culture data.  Subjective 2/14: Patient seen examined this morning at bedside.  No acute events reported overnight.  She denies acute complaints today including pain, fever, chills, chest pain, shortness of breath, nausea vomiting diarrhea.  Assessment & Plan:   Active Problems:   Cellulitis of right lower extremity   Group B streptococcus bacteremia Right lower extremity cellulitis, mild, nonpurulent Suspected bacteremia secondary to cellulitis versus possible osteomyelitis associated with recent surgery and hardware, however overlying wound does not appear infected.   --Continue Unasyn --ID following --Follow-up repeat blood  cultures --Wound cultures negative to date, follow for final results --Will require a few weeks of IV antibiotic therapy  Right lower extremity DVT -provoked by recent right ankle surgery and secondary immobility --Continue therapeutic Lovenox while admitted --Xarelto on discharge  Right lower extremity wound status post right ankle ORIF Wound does not appear infected.  Please refer to Dr. Deborra Medina note of 2/12 --Continues to be nonweightbearing --X-rays show well-healed fracture and intact hardware --Possible CT scan of the right ankle to evaluate for osteomyelitis if no other source of infection to account for bacteremia  Mild AKI superimposed on CKD stage IIIa -likely due to mild dehydration, prerenal azotemia. AKI improved with IV hydration. 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  Doxy on hold on Unasyn.   DVT prophylaxis: On full dose Lovenox   Code Status: Full Code  Family Communication: None at bedside  Disposition Plan: Pending further clinical improvement and negative repeat blood cultures.   Coming From home Exp DC Date: 2/15-16 earliest Barriers: Repeat blood cultures for bacteremia, potential further evaluation for osteomyelitis Medically Stable for Discharge?  No  Consultants:   Infectious disease  Podiatry  Hematology  Procedures:   None  Antimicrobials:   Rocephin 2/11 PM>> 2/12  Unasyn 2/12 >>> TBD   Objective: Vitals:   10/29/19 0509 10/29/19 1150 10/29/19 1934 10/30/19 0456  BP: (!) 107/56 128/69 (!) 112/46 (!) 116/54  Pulse: 76 73 77 79   Resp:  16 20 18   Temp:  98.2 F (36.8 C) 97.9 F (36.6 C) 97.9 F (36.6 C)  TempSrc:  Oral Oral Oral  SpO2:  98% 97% 97%  Weight:      Height:        Intake/Output Summary (Last 24 hours) at 10/30/2019 0831 Last data filed at 10/29/2019 1700 Gross per 24 hour  Intake 720 ml  Output 250 ml  Net 470 ml   Filed Weights   10/27/19 2108 10/28/19 0053  Weight: 114.8 kg 116.2 kg    Examination:  General exam: awake, alert, no acute distress Respiratory system: CTAB, no wheezes, rales or rhonchi, normal respiratory effort. Cardiovascular system: normal S1/S2, RRR, bilateral lower extremity edema stable.   Central nervous system: A&O x4. no gross focal neurologic deficits, normal speech Extremities: moves all, right lower extremity dressing clean dry and intact, distal sensation intact Skin: dry, intact, normal temperature, normal color Psychiatry: normal mood, congruent affect, judgement and insight appear normal    Data Reviewed: I have personally reviewed following labs and imaging studies  CBC: Recent Labs  Lab 10/27/19 2118 10/29/19 0527 10/30/19 0502  WBC 16.7* 9.2 7.3  NEUTROABS 15.3*  --   --   HGB 12.9 11.0* 11.0*  HCT 40.9 34.2* 34.7*  MCV 92.3 91.9 93.3  PLT 131* 98* 123456*   Basic Metabolic Panel: Recent Labs  Lab 10/27/19 2118 10/29/19 0527 10/30/19 0502  NA 134* 138 139  K 4.0 3.4* 3.9  CL 100 106 105  CO2 23 23 27   GLUCOSE 121* 88 90  BUN 18 14 12   CREATININE 1.27* 1.04* 1.04*  CALCIUM 9.6 8.5* 8.8*   GFR: Estimated Creatinine Clearance: 66.7 mL/min (A) (by C-G formula based on SCr of 1.04 mg/dL (H)). Liver Function Tests: Recent Labs  Lab 10/27/19 2118  AST 19  ALT 16  ALKPHOS 121  BILITOT 0.8  PROT 8.5*  ALBUMIN 3.9   No results for input(s): LIPASE, AMYLASE in the last 168 hours. No results for input(s): AMMONIA in the last 168 hours. Coagulation Profile: Recent Labs  Lab 10/28/19 0623  INR 1.2   Cardiac Enzymes: No results  for input(s): CKTOTAL, CKMB, CKMBINDEX, TROPONINI in the last 168 hours. BNP (last 3 results) No results for input(s): PROBNP in the last 8760 hours. HbA1C: Recent Labs    10/28/19 0623  HGBA1C 5.5   CBG: Recent Labs  Lab 10/29/19 0754 10/29/19 1141 10/29/19 1711 10/29/19 2127 10/30/19 0737  GLUCAP 72 82 78 74 82   Lipid Profile: No results for input(s): CHOL, HDL, LDLCALC, TRIG, CHOLHDL, LDLDIRECT in the last 72 hours. Thyroid Function Tests: No results for input(s): TSH, T4TOTAL, FREET4, T3FREE, THYROIDAB in the last 72 hours. Anemia Panel: No results for input(s): VITAMINB12, FOLATE, FERRITIN, TIBC, IRON, RETICCTPCT in the last 72 hours. Sepsis Labs: Recent Labs  Lab 10/27/19 2118 10/27/19 2323  LATICACIDVEN 1.8 1.7    Recent Results (from the past 240 hour(s))  Culture, blood (routine x 2)     Status: Abnormal (Preliminary result)   Collection Time: 10/27/19  9:18 PM   Specimen: BLOOD  Result Value Ref Range Status   Specimen Description   Final    BLOOD LEFT ANTECUBITAL Performed at [facility], [address], [place],  [address]    Special Requests   Final    BOTTLES DRAWN AEROBIC AND ANAEROBIC Blood  Culture adequate volume Performed at [facility], [address] [place], [place] [address]    Culture  Setup Time   Final    GRAM POSITIVE COCCI IN BOTH AEROBIC AND ANAEROBIC BOTTLES CRITICAL RESULT CALLED TO, READ BACK BY AND VERIFIED WITH: KRISTEN MERRILL AT 1005 10/28/19.PMF    Culture (A)  Final    GROUP B STREP(S.AGALACTIAE)ISOLATED SUSCEPTIBILITIES TO FOLLOW Performed at [facility] [address] [place], [place] [address]    Report Status PENDING  Incomplete  Blood Culture ID Panel (Reflexed)     Status: Abnormal   Collection Time: 10/27/19  9:18 PM  Result Value Ref Range Status   Enterococcus species NOT DETECTED NOT DETECTED Final   Listeria monocytogenes NOT DETECTED NOT DETECTED Final   Staphylococcus  species NOT DETECTED NOT DETECTED Final   Staphylococcus aureus (BCID) NOT DETECTED NOT DETECTED Final   Streptococcus species DETECTED (A) NOT DETECTED Final    Comment: CRITICAL RESULT CALLED TO, READ BACK BY AND VERIFIED WITH: KRISTEN MERRILL AT [id number] 10/28/19.PMF    Streptococcus agalactiae DETECTED (A) NOT DETECTED Final    Comment: CRITICAL RESULT CALLED TO, READ BACK BY AND VERIFIED WITH: KRISTEN MERRILL AT 1005 10/28/19.PMF    Streptococcus pneumoniae NOT DETECTED NOT DETECTED Final   Streptococcus pyogenes NOT DETECTED NOT DETECTED Final   Acinetobacter baumannii NOT DETECTED NOT DETECTED Final   Enterobacteriaceae species NOT DETECTED NOT DETECTED Final   Enterobacter cloacae complex NOT DETECTED NOT DETECTED Final   Escherichia coli NOT DETECTED NOT DETECTED Final   Klebsiella oxytoca NOT DETECTED NOT DETECTED Final   Klebsiella pneumoniae NOT DETECTED NOT DETECTED Final   Proteus species NOT DETECTED NOT DETECTED Final   Serratia marcescens NOT DETECTED NOT DETECTED Final   Haemophilus influenzae NOT DETECTED NOT DETECTED Final   Neisseria meningitidis NOT DETECTED NOT DETECTED Final   Pseudomonas aeruginosa NOT DETECTED NOT DETECTED Final   Candida albicans NOT DETECTED NOT DETECTED Final   Candida glabrata NOT DETECTED NOT DETECTED Final   Candida krusei NOT DETECTED NOT DETECTED Final   Candida parapsilosis NOT DETECTED NOT DETECTED Final   Candida tropicalis NOT DETECTED NOT DETECTED Final    Comment: Performed at [facility], [address] [place], [place] [address]  Culture, blood (routine x 2)     Status: Abnormal (Preliminary result)   Collection Time: 10/27/19  9:23 PM   Specimen: BLOOD  Result Value Ref Range Status   Specimen Description   Final    BLOOD BLOOD LEFT HAND Performed at [facility], [address], [place], [place] [address]    Special Requests   Final    BOTTLES DRAWN AEROBIC AND ANAEROBIC Blood Culture adequate  volume Performed at [facility], [address] [place], [place] [address]    Culture  Setup Time   Final    GRAM POSITIVE COCCI IN BOTH AEROBIC AND ANAEROBIC BOTTLES CRITICAL RESULT CALLED TO, READ BACK BY AND VERIFIED WITH: KRISTEN MERRILL AT 1005 10/28/19.PMF Performed at [facility], [address] [place],  [address]    Culture (A)  Final    STREPTOCOCCUS AGALACTIAE SUSCEPTIBILITIES TO FOLLOW Performed at [facility], [place] [address], [place],  [address]    Report Status PENDING  Incomplete  SARS CORONAVIRUS 2 (TAT 6-24 HRS) Nasopharyngeal Nasopharyngeal Swab     Status: None   Collection Time: 10/27/19 11:07 PM   Specimen: Nasopharyngeal Swab  Result Value Ref Range Status  SARS Coronavirus 2 NEGATIVE NEGATIVE Final    Comment: (NOTE) SARS-CoV-2 target nucleic acids are NOT DETECTED. The SARS-CoV-2 RNA is generally detectable in upper and lower respiratory specimens during the acute phase of infection. Negative results do not preclude SARS-CoV-2 infection, do not rule out co-infections with other pathogens, and should not be used as the sole basis for treatment or other patient management decisions. Negative results must be combined with clinical observations, patient history, and epidemiological information. The expected result is Negative. Fact Sheet for Patients: SugarRoll.be Fact Sheet for Healthcare Providers: https://www.woods-mathews.com/ This test is not yet approved or cleared by the [place] FDA and  has been authorized for detection and/or diagnosis of SARS-CoV-2 by FDA under an Emergency Use Authorization (EUA). This EUA will remain  in effect (meaning this test can be used) for the duration of the COVID-19 declaration under Section 56 4(b)(1) of the Act, 21 U.S.C. section 360bbb-3(b)(1), unless the authorization is terminated or revoked sooner. Performed at [facility], [place] [address] [place], [place] [address]   Aerobic/Anaerobic Culture (surgical/deep wound)     Status: None (Preliminary result)   Collection Time: 10/28/19 12:30 PM   Specimen: Wound  Result Value Ref Range Status   Specimen Description   Final    WOUND LEG RIGHT Performed at [facility], [address], [place], [place] [address]    Special Requests   Final    Normal Performed at [facility], [place], [place] [address]    Gram Stain   Final    NO WBC SEEN MODERATE GRAM POSITIVE COCCI MODERATE GRAM POSITIVE RODS    Culture   Final    FEW STREPTOCOCCUS AGALACTIAE TESTING AGAINST S. AGALACTIAE NOT ROUTINELY PERFORMED DUE TO PREDICTABILITY OF AMP/PEN/VAN SUSCEPTIBILITY. CULTURE REINCUBATED FOR BETTER GROWTH Performed at [facility], [place] [address] [place], [place] [address]    Report Status PENDING  Incomplete  CULTURE, BLOOD (ROUTINE X 2) w Reflex to ID Panel     Status: None (Preliminary result)   Collection Time: 10/29/19  1:44 PM   Specimen: BLOOD  Result Value Ref Range Status   Specimen Description BLOOD [facility]  Final   Special Requests   Final    BOTTLES DRAWN AEROBIC AND ANAEROBIC Blood Culture adequate volume   Culture   Final    NO GROWTH < 24 HOURS Performed at [facility], [address], [place], [place] [address]    Report Status PENDING  Incomplete  CULTURE, BLOOD (ROUTINE X 2) w Reflex to ID Panel     Status: None (Preliminary result)   Collection Time: 10/29/19  1:46 PM   Specimen: BLOOD  Result Value Ref Range Status   Specimen Description BLOOD  LAC  Final   Special Requests   Final    BOTTLES DRAWN AEROBIC AND ANAEROBIC Blood Culture adequate volume   Culture   Final    NO GROWTH < 24 HOURS Performed at [facility], [address], [place], [place] [address]    Report Status PENDING  Incomplete         Radiology Studies: DG Ankle 2 Views Right  Result Date:  10/28/2019 CLINICAL DATA:  The patient suffered a distal fibular fracture due to a fall in December, 2020. The patient underwent ORIF 09/02/2019 and now has developed swelling, warmth and tenderness about the surgical site. EXAM: RIGHT ANKLE - 2 VIEW COMPARISON:  Intraoperative imaging for fracture fixation 09/02/2019.  FINDINGS: Plate and screw fixation of a distal fibular fracture is identified. Position and alignment are anatomic. Subtle lucency about fixation screws is most notable about the 3rd-6th screws from the top and worrisome for infection or loosening. Soft tissues about the ankle are swollen. No soft tissue gas is identified. Vac drain is in place. IMPRESSION: Status post fixation of a distal fibular fracture. There is subtle lucency about multiple fixation screws worrisome for loosening or infection. Soft tissue swelling about the ankle without soft tissue gas. Electronically Signed   By: Inge Rise M.D.   On: 10/28/2019 09:29        Scheduled Meds: . atorvastatin  40 mg Oral QPM  . brimonidine  1 drop Both Eyes TID   And  . brinzolamide  1 drop Both Eyes TID  . calcium-vitamin D  1 tablet Oral BID  . enoxaparin (LOVENOX) injection  115 mg Subcutaneous Q12H  . fluticasone  2 spray Each Nare Daily  . insulin aspart  0-15 Units Subcutaneous TID PC & HS  . insulin aspart  3 Units Subcutaneous TID WC  . magnesium oxide  400 mg Oral Daily  . metoprolol succinate  100 mg Oral QHS  . midodrine  5 mg Oral TID PC  . mirabegron ER  25 mg Oral Daily   Continuous Infusions: . sodium chloride 100 mL/hr at 10/29/19 1553  . ampicillin-sulbactam (UNASYN) IV 3 g (10/30/19 0519)     LOS: 3 days    Time spent: 25 minutes    Ezekiel Slocumb, DO [facility]   If 7PM-7AM, please contact night-coverage [email or web address] 10/30/2019, 8:31 AM

## 2019-10-31 ENCOUNTER — Inpatient Hospital Stay: Payer: BC Managed Care – PPO

## 2019-10-31 ENCOUNTER — Encounter: Payer: Self-pay | Admitting: Family Medicine

## 2019-10-31 LAB — BASIC METABOLIC PANEL
Anion gap: 9 (ref 5–15)
BUN: 11 mg/dL (ref 8–23)
CO2: 24 mmol/L (ref 22–32)
Calcium: 8.8 mg/dL — ABNORMAL LOW (ref 8.9–10.3)
Chloride: 105 mmol/L (ref 98–111)
Creatinine, Ser: 0.93 mg/dL (ref 0.44–1.00)
GFR calc Af Amer: >60 mL/min (ref >60)
GFR calc non Af Amer: >60 mL/min (ref >60)
Glucose, Bld: 90 mg/dL (ref 70–99)
Potassium: 3.5 mmol/L (ref 3.5–5.1)
Sodium: 138 mmol/L (ref 135–145)

## 2019-10-31 LAB — CBC
HCT: 34.9 % — ABNORMAL LOW (ref 36.0–46.0)
Hemoglobin: 11.2 g/dL — ABNORMAL LOW (ref 12.0–15.0)
MCH: 29.1 pg (ref 26.0–34.0)
MCHC: 32.1 g/dL (ref 30.0–36.0)
MCV: 90.6 fL (ref 80.0–100.0)
Platelets: 146 10*3/uL — ABNORMAL LOW (ref 150–400)
RBC: 3.85 MIL/uL — ABNORMAL LOW (ref 3.87–5.11)
RDW: 16.6 % — ABNORMAL HIGH (ref 11.5–15.5)
WBC: 6.9 10*3/uL (ref 4.0–10.5)
nRBC: 0 % (ref 0.0–0.2)

## 2019-10-31 LAB — GLUCOSE, CAPILLARY
Glucose-Capillary: 85 mg/dL (ref 70–99)
Glucose-Capillary: 95 mg/dL (ref 70–99)
Glucose-Capillary: 98 mg/dL (ref 70–99)
Glucose-Capillary: 114 mg/dL — ABNORMAL HIGH (ref 70–99)

## 2019-10-31 MED ORDER — IOHEXOL 300 MG/ML  SOLN
100.0000 mL | Freq: Once | INTRAMUSCULAR | Status: AC | PRN
  Administered 2019-10-31: 100 mL via INTRAVENOUS

## 2019-10-31 MED ORDER — HYDROCODONE-ACETAMINOPHEN 5-325 MG PO TABS
1.0000 | ORAL_TABLET | Freq: Four times a day (QID) | ORAL | Status: DC | PRN
  Administered 2019-10-31 – 2019-11-07 (×13): 1 via ORAL
  Filled 2019-10-31 (×13): qty 1

## 2019-10-31 MED ORDER — MORPHINE SULFATE (PF) 2 MG/ML IV SOLN
2.0000 mg | INTRAVENOUS | Status: DC | PRN
  Administered 2019-10-31: 13:00:00 2 mg via INTRAVENOUS
  Filled 2019-10-31 (×2): qty 1

## 2019-10-31 MED ORDER — IMIPRAMINE HCL 25 MG PO TABS
25.0000 mg | ORAL_TABLET | Freq: Every day | ORAL | Status: DC
  Administered 2019-11-02 – 2019-11-06 (×5): 25 mg via ORAL
  Filled 2019-10-31 (×9): qty 1

## 2019-10-31 MED ORDER — OXYCODONE HCL 5 MG PO TABS
10.0000 mg | ORAL_TABLET | ORAL | Status: DC | PRN
  Administered 2019-10-31 – 2019-11-03 (×3): 10 mg via ORAL
  Filled 2019-10-31 (×3): qty 2

## 2019-10-31 MED ORDER — BLISTEX MEDICATED EX OINT
1.0000 "application " | TOPICAL_OINTMENT | CUTANEOUS | Status: DC | PRN
  Filled 2019-10-31: qty 6.3

## 2019-10-31 MED ORDER — OXYCODONE HCL 5 MG PO TABS
10.0000 mg | ORAL_TABLET | Freq: Four times a day (QID) | ORAL | Status: DC | PRN
  Administered 2019-10-31: 14:00:00 10 mg via ORAL
  Filled 2019-10-31: qty 2

## 2019-10-31 MED ORDER — MORPHINE SULFATE (PF) 4 MG/ML IV SOLN
4.0000 mg | INTRAVENOUS | Status: DC | PRN
  Administered 2019-10-31 – 2019-11-03 (×10): 4 mg via INTRAVENOUS
  Filled 2019-10-31 (×10): qty 1

## 2019-10-31 MED ORDER — MORPHINE SULFATE (PF) 4 MG/ML IV SOLN
4.0000 mg | INTRAVENOUS | Status: DC | PRN
  Administered 2019-10-31: 4 mg via INTRAVENOUS
  Filled 2019-10-31: qty 1

## 2019-10-31 NOTE — Consult Note (Signed)
PODIATRY / FOOT AND ANKLE SURGERY PROGRESS NOTE  Reason for consult: Right lateral ankle wound  Chief Complaint: R ankle wound, leg pain   HPI: Becky Trevino is a 74 y.o. female who presents today sitting in a chair at bedside eating lunch.  She states overall that she has no pain to the right lower extremity.  She notes that she has increased pain to the left hip area.  Patient states that fairly recently she had a hematoma drained to the left hip and has followed up with orthopedics for this.  She states that she was seen about a week ago and was told to follow-up in 6 weeks.  She overall thinks that she is doing fairly well at this time.  Patient denies nausea, vomiting, fever, chills.  PMHx:  Past Medical History:  Diagnosis Date  . Cervical spondylosis   . Chronic kidney disease    Stage 3  . DDD (degenerative disc disease), cervical    [facility]  . Diabetes mellitus without complication ([place])   . Diverticulosis   . Hyperlipidemia   . Hypertension   . Intertrigo   . Leg cramps   . Leg varices   . Obesity   . OSA (obstructive sleep apnea)   . Symptomatic menopausal or female climacteric states   . Syncope     Surgical Hx:  Past Surgical History:  Procedure Laterality Date  . ABDOMINAL HYSTERECTOMY  1993   Total  . BUNIONECTOMY Bilateral 1993  . I & D KNEE WITH POLY EXCHANGE Right 11/19/2017   Procedure: RIGHT KNEE POLY EXCHANGE WITH IRRIGATION AND DEBRIDEMENT;  Surgeon: Hessie Knows, MD;  Location: [facility] ORS;  Service: Orthopedics;  Laterality: Right;  . JOINT REPLACEMENT     bilateral knee  . LAMINECTOMY  11/14/2013    Cervical Fusion , [facility], Dr. Delilah Shan  . LASER ABLATION Bilateral 07/29/2012   Dr. Lucky Cowboy  . ORIF ANKLE FRACTURE Right 09/02/2019   Procedure: OPEN REDUCTION INTERNAL FIXATION (ORIF) ANKLE FRACTURE BIMALLEOLAR;  Surgeon: [name], DPM;  Location: [facility] ORS;  Service: Podiatry;  Laterality: Right;  . REPLACEMENT TOTAL KNEE Left 03/2010   Dr. Rudene Christians   . TOTAL HIP ARTHROPLASTY Left 12/11/2015   Procedure: TOTAL HIP ARTHROPLASTY ANTERIOR APPROACH;  Surgeon: Hessie Knows, MD;  Location: [facility] ORS;  Service: Orthopedics;  Laterality: Left;  . TOTAL KNEE ARTHROPLASTY Right 03/11/2016   Procedure: TOTAL KNEE ARTHROPLASTY;  Surgeon: Hessie Knows, MD;  Location: [facility] ORS;  Service: Orthopedics;  Laterality: Right;    FHx:  Family History  Problem Relation Age of Onset  . Diabetes Mother   . Hyperlipidemia Mother   . Hypertension Mother   . Diabetes Father   . Hyperlipidemia Father   . Hypertension Father   . Obesity Father   . Hypertension Sister   . Hyperlipidemia Sister   . Hyperlipidemia Sister   . Breast cancer Neg Hx     Social History:  reports that she has never smoked. She has never used smokeless tobacco. She reports that she does not drink alcohol or use drugs.  Allergies:  Allergies  Allergen Reactions  . Lisinopril Swelling    Review of Systems: General ROS: negative Respiratory ROS: no cough, shortness of breath, or wheezing Cardiovascular ROS: no chest pain or dyspnea on exertion Musculoskeletal ROS: positive for - joint pain and swelling in leg - right Neurological ROS: positive for - numbness/tingling Dermatological ROS: positive for right lateral ankle wound  Medications Prior to Admission  Medication Sig Dispense Refill  . acetaminophen (TYLENOL) 650 MG CR tablet Take 650-1,300 mg by mouth 2 (two) times daily as needed for pain.    Marland Kitchen atorvastatin (LIPITOR) 40 MG tablet Take 1 tablet (40 mg total) by mouth daily at 6 PM. (Patient taking differently: Take 40 mg by mouth every evening. ) 90 tablet 1  . Calcium Carbonate-Vitamin D (CALCIUM 600-D) 600-400 MG-UNIT per tablet Take 1 tablet by mouth 2 (two) times daily.    Marland Kitchen doxycycline (VIBRAMYCIN) 100 MG capsule Take 100 mg by mouth 2 (two) times daily.    Marland Kitchen enoxaparin (LOVENOX) 40 MG/0.4ML injection Inject 0.4 mLs (40 mg total) into the skin daily. 20 mL 0  .  fluticasone (FLONASE) 50 MCG/ACT nasal spray Place 2 sprays into both nostrils daily. 16 g 2  . glucose blood (ONE TOUCH ULTRA TEST) test strip Use as instructed 100 each 12  . imipramine (TOFRANIL) 25 MG tablet Take 1 tablet (25 mg total) by mouth at bedtime. 90 tablet 3  . ketoconazole (NIZORAL) 2 % cream Apply 1 application topically 2 (two) times daily as needed (rash).     . Magnesium Oxide 500 MG CAPS Take 500 mg by mouth daily.     . metoprolol succinate (TOPROL-XL) 100 MG 24 hr tablet Take 100 mg by mouth at bedtime.    . midodrine (PROAMATINE) 5 MG tablet Take 1 tablet (5 mg total) by mouth 3 (three) times daily with meals. (Patient taking differently: Take 5 mg by mouth 3 (three) times daily. ) 90 tablet 0  . mirabegron ER (MYRBETRIQ) 25 MG TB24 tablet Take 1 tablet (25 mg total) by mouth daily. 90 tablet 3  . oxyCODONE-acetaminophen (PERCOCET) 7.5-325 MG tablet Take 1 tablet by mouth every 4 (four) hours as needed for severe pain.    Marland Kitchen SIMBRINZA 1-0.2 % SUSP Place 1 drop into both eyes 2 (two) times daily.    . celecoxib (CELEBREX) 200 MG capsule Take 200 mg by mouth daily as needed for moderate pain.   11  . furosemide (LASIX) 20 MG tablet Take 20 mg by mouth every evening.    . gabapentin (NEURONTIN) 300 MG capsule Take 1 capsule (300 mg total) by mouth every evening. (Patient not taking: Reported on 09/23/2019) 90 capsule 2  . Netarsudil-Latanoprost (ROCKLATAN) 0.02-0.005 % SOLN Place 1 drop into both eyes at bedtime.    Marland Kitchen oxycodone (OXY-IR) 5 MG capsule Take 5 mg by mouth every 6 (six) hours as needed for pain.    Marland Kitchen oxyCODONE-acetaminophen (PERCOCET/ROXICET) 5-325 MG tablet Take 1 tablet by mouth as directed.    . potassium chloride (KLOR-CON) 10 MEQ tablet Take 10 mEq by mouth daily.    . traZODone (DESYREL) 50 MG tablet TAKE 1 TABLET BY MOUTH AT BEDTIME AS NEEDED FOR SLEEP (Patient not taking: Reported on 10/27/2019) 90 tablet 1  . XARELTO 20 MG TABS tablet Take 20 mg by mouth daily.       Physical Exam: General: Alert and oriented.  No apparent distress.  Vascular: DP/PT pulses faintly palpable bil.  CFT intact to digits bil.  Mild nonpitting edema to the right lower extremity.  Erythema overall absent to the right lower extremity since admission, much improved.  Neuro: Light touch sensation reduced to digits bil.  Derm: Central aspect of incision line right lateral ankle over the fibular dehiscence site probes to deep subQ tissue, approximately 0.7cm deep, mild serousanginous drainage today, no odor, no periwound erythema present, no odor, no  visible plate or bone.  MSK: No POP R calf area.  No POP of the R lateral ankle.  No pain with AJ DF and PF R.   Results for orders placed or performed during the hospital encounter of 10/27/19 (from the past 48 hour(s))  CULTURE, BLOOD (ROUTINE X 2) w Reflex to ID Panel     Status: None (Preliminary result)   Collection Time: 10/29/19  1:44 PM   Specimen: BLOOD  Result Value Ref Range   Specimen Description BLOOD BLH    Special Requests      BOTTLES DRAWN AEROBIC AND ANAEROBIC Blood Culture adequate volume   Culture      NO GROWTH 2 DAYS Performed at [facility], [address] [place], [place] [address]    Report Status PENDING   CULTURE, BLOOD (ROUTINE X 2) w Reflex to ID Panel     Status: None (Preliminary result)   Collection Time: 10/29/19  1:46 PM   Specimen: BLOOD  Result Value Ref Range   Specimen Description BLOOD  LAC    Special Requests      BOTTLES DRAWN AEROBIC AND ANAEROBIC Blood Culture adequate volume   Culture      NO GROWTH 2 DAYS Performed at [facility], [address] [place], [place] [address]    Report Status PENDING   Glucose, capillary     Status: None   Collection Time: 10/29/19  5:11 PM  Result Value Ref Range   Glucose-Capillary 78 70 - 99 mg/dL   Comment 1 Notify RN   Glucose, capillary     Status: None   Collection Time: 10/29/19  9:27 PM  Result Value Ref  Range   Glucose-Capillary 74 70 - 99 mg/dL  CBC     Status: Abnormal   Collection Time: 10/30/19  5:02 AM  Result Value Ref Range   WBC 7.3 4.0 - 10.5 K/uL   RBC 3.72 (L) 3.87 - 5.11 MIL/uL   Hemoglobin 11.0 (L) 12.0 - 15.0 g/dL   HCT 34.7 (L) 36.0 - 46.0 %   MCV 93.3 80.0 - 100.0 fL   MCH 29.6 26.0 - 34.0 pg   MCHC 31.7 30.0 - 36.0 g/dL   RDW 16.9 (H) 11.5 - 15.5 %   Platelets 118 (L) 150 - 400 K/uL    Comment: REPEATED TO VERIFY SPECIMEN CHECKED FOR CLOTS Immature Platelet Fraction may be clinically indicated, consider ordering this additional test [id number] CONSISTENT WITH PREVIOUS RESULT    nRBC 0.0 0.0 - 0.2 %    Comment: Performed at [facility], [address] [place], [place] [id number]  Basic metabolic panel     Status: Abnormal   Collection Time: 10/30/19  5:02 AM  Result Value Ref Range   Sodium 139 135 - 145 mmol/L   Potassium 3.9 3.5 - 5.1 mmol/L   Chloride 105 98 - 111 mmol/L   CO2 27 22 - 32 mmol/L   Glucose, Bld 90 70 - 99 mg/dL   BUN 12 8 - 23 mg/dL   Creatinine, Ser 1.04 (H) 0.44 - 1.00 mg/dL   Calcium 8.8 (L) 8.9 - 10.3 mg/dL   GFR calc non Af Amer 53 (L) >60 mL/min   GFR calc Af Amer >60 >60 mL/min   Anion gap 7 5 - 15    Comment: Performed at [facility], [address] [place], [place] [address]  Glucose, capillary     Status: None   Collection Time: 10/30/19  7:37 AM  Result Value Ref Range   Glucose-Capillary 82 70 - 99 mg/dL  Glucose, capillary     Status: None   Collection Time: 10/30/19 11:45 AM  Result Value Ref Range   Glucose-Capillary 79 70 - 99 mg/dL  Glucose, capillary     Status: None   Collection Time: 10/30/19  9:22 PM  Result Value Ref Range   Glucose-Capillary 96 70 - 99 mg/dL  CBC     Status: Abnormal   Collection Time: 10/31/19  5:26 AM  Result Value Ref Range   WBC 6.9 4.0 - 10.5 K/uL   RBC 3.85 (L) 3.87 - 5.11 MIL/uL   Hemoglobin 11.2 (L) 12.0 - 15.0 g/dL   HCT 34.9 (L) 36.0 - 46.0 %   MCV 90.6  80.0 - 100.0 fL   MCH 29.1 26.0 - 34.0 pg   MCHC 32.1 30.0 - 36.0 g/dL   RDW 16.6 (H) 11.5 - 15.5 %   Platelets 146 (L) 150 - 400 K/uL   nRBC 0.0 0.0 - 0.2 %    Comment: Performed at [facility], [address] [place], [place] [id number]  Basic metabolic panel     Status: Abnormal   Collection Time: 10/31/19  5:26 AM  Result Value Ref Range   Sodium 138 135 - 145 mmol/L   Potassium 3.5 3.5 - 5.1 mmol/L   Chloride 105 98 - 111 mmol/L   CO2 24 22 - 32 mmol/L   Glucose, Bld 90 70 - 99 mg/dL   BUN 11 8 - 23 mg/dL   Creatinine, Ser 0.93 0.44 - 1.00 mg/dL   Calcium 8.8 (L) 8.9 - 10.3 mg/dL   GFR calc non Af Amer >60 >60 mL/min   GFR calc Af Amer >60 >60 mL/min   Anion gap 9 5 - 15    Comment: Performed at [facility], [address] [place], [place] [address]  Glucose, capillary     Status: None   Collection Time: 10/31/19  7:55 AM  Result Value Ref Range   Glucose-Capillary 85 70 - 99 mg/dL  Glucose, capillary     Status: None   Collection Time: 10/31/19 11:42 AM  Result Value Ref Range   Glucose-Capillary 95 70 - 99 mg/dL   CT ANKLE RIGHT W CONTRAST  Result Date: 10/31/2019 CLINICAL DATA:  Dehiscence of the surgical wound along the right lateral ankle. Prior plate and screw fixation of the lateral malleolus. EXAM: CT OF THE RIGHT ANKLE WITH CONTRAST TECHNIQUE: Multidetector CT imaging of the right ankle was performed following the standard protocol during bolus administration of intravenous contrast. CONTRAST:  137mL OMNIPAQUE IOHEXOL 300 MG/ML  SOLN COMPARISON:  Radiographs from 10/28/2019 FINDINGS: Bones/Joint/Cartilage Lateral malleolar plate and screw fixator traversing the oblique fracture, with lack of bony bridging and early cortication along the fracture plane favoring early nonunion. Concern was raised on radiography for lucency around the screws but currently no abnormal lucency is observed around the 5 screws proximal to the fracture or 2 screws distal  to the fracture to favor loosening or infection. No bony destructive findings are identified. Small fragments along the anterior margin of the fracture plane indicate mild prior comminution There is periosteal reaction along the posterior tibial metaphysis along with a corticated ossicle along the posteroinferior tibial rim indicating a likely small posterior malleolar fracture component of the prior injury, favoring at least a stage III Weber B injury. There is a small ossicle below the medial malleolus but it  appears well corticated and does not encompass the entire attachment of the deep tibiotalar portion of the deltoid ligament., which grossly appears intact, implying that this was probably not a stage IV Weber B injury. Mild irregular spurring with some underlying lucency in the medial talar dome posteriorly for example on images 80-85 of series 6, favoring degenerative spurring and potentially a small sagittally oriented non-fragmented chronic osteochondral injury. Degenerative arthropathy of the Lisfranc joint medially; only part of the Lisfranc joint is included on today's examination but there is clearly considerable spurring and loss of articular space between the medial cuneiform and base of the first metatarsal, and the middle cuneiform and the base of the second metatarsal. Please note that the Lisfranc joint is not included in its entirety. Mild articular surface irregularity along the posterior tibial plafond for example on image 44/7. Plantar calcaneal spur noted. Ligaments Suboptimally assessed by CT. My sense on image 83/9 is that the deep tibiotalar portion of the deltoid ligament is probably intact, although there is some indistinctness and surrounding edema. The tibionavicular and tibiospring portions of deltoid ligament are obscured by surrounding edema. Small bony fragments just above the expected vicinity of the anterior talofibular ligament. Muscles and Tendons No flexor tendon entrapment.  Expanded contour of the tibialis posterior tendon compatible with tendinopathy. Soft tissues Subcutaneous edema surrounding the ankle and tracking in the dorsum of the foot and the heel. There is some confluent mild cutaneous irregularity overlying the lateral plate and screw fixator for example on image 51/8 at least partially from overlying bandaging, but reportedly this is the region of the cutaneous wound. I do not see any gas tracking in the soft tissues. I do not observe a drainable rim enhancing fluid collection to suggest abscess. IMPRESSION: 1. Lateral malleolar plate and screw fixator traversing the oblique fracture, with lack of bony bridging and early cortication along the fracture plane favoring early nonunion. There is also some periosteal reaction along the posterior tibial metaphysis compatible with a small posterior malleolar fracture component of the prior injury. Appearance compatible with previous Weber B stage III fracture. 2. No current lucency along the screws of the plate and screw fixator to suggest loosening or infection around the screws. 3. Small ossicle below the medial malleolus appears well corticated and does not encompass the entire attachment of the deep tibiotalar portion of the deltoid ligament. 4. Degenerative arthropathy of the Lisfranc joint medially, only part of the Lisfranc joint is included on today's examination. 5. Plantar calcaneal spur. 6. Expanded contour of the tibialis posterior tendon compatible with tendinopathy. Correlate clinically in assessing for tibialis posterior dysfunction. 7. Subcutaneous edema surrounding the ankle and tracking in the dorsum of the foot and heel. No drainable abscess is identified. No gas tracking in the soft tissues. 8. Mild articular surface irregularity along the posterior tibial plafond, query a small sagittally oriented non-fragmented chronic osteochondral injury. Electronically Signed   By: Van Clines M.D.   On: 10/31/2019  08:23    Blood pressure 133/77, pulse 65, temperature 97.8 F (36.6 C), temperature source Oral, resp. rate 20, height 5\' 11"  (1.803 m), weight 116.2 kg, SpO2 97 %.  Assessment 1. Bacteremia with leukocytosis and altered mental status potentially 2/2 R lateral ankle wound vs other source? 2. DVT right lower extremity 3. Diabetes type 2 polyneuropathy 4. Right ankle fracture status post ORIF with delayed healing  Plan -Patient seen and examined. -X-ray and CT results discussed with patient in detail.  X-ray appears to show  excellent apposition of the fracture as the fracture does not appear to be visible.  The CT scan that was performed does show some delayed union across the fibula at the level of the fracture sites.  Anatomic reduction still appears to be obtained.  Ankle mortise appears intact.  No evidence of infection is seen on x-ray or CT scan. -Applied saline soaked gauze to the wound followed by 4 x 4 gauze, ABD, Kerlix, and Ace wrap from the forefoot to below knee. -Appreciate recommendations per infectious disease for antibiotic therapy.  Blood culture from 2 days ago with no growth to date.  Wound culture with gram-positive cocci and gram-positive rods.  Few strep agalactiae, Acinetobacter rare growth from wound culture.  Blood culture from 4 days ago with strep agalactiae.  Recommend outpatient IV antibiotics. -Consult also placed for vascular surgery.  Patient had recent ABIs which showed biphasic to triphasic flow with relatively normal ABIs.  Consulted due to patient's low wound healing and history of diabetes as well as venous insufficiency. -Believe no surgical intervention is indicated at this time as the wound appears to not go to bone at this point as there is no plate or bone exposed.  We will continue to monitor the situation outpatient.  Earliest time to potentially remove hardware be at the 42-month mark postoperatively.  Patient is currently at 8 weeks. -Patient to be  nonweightbearing still to the right lower extremity. -Discussed potential orthopedic consultation for left hip pain with Dr. Arbutus Ped.  [name], DPM 10/31/2019, 12:51 PM

## 2019-10-31 NOTE — Discharge Instructions (Signed)
Podiatry discharge instructions: Change dressings every other day consisting of collagen packing to the wound followed by 4 x 4 gauze, Kerlix, Ace wrap from the forefoot to below the knee with a mild to moderate amount of compression.  Please follow-up in clinic within 1 week of your discharge date.  We will have skin grafts available at that time to place into the wound to further help stimulate the healing process.  Continue to remain nonweightbearing to the right lower extremity at all times as your fracture still does not appear to be completely healed with the CT scan that was performed in the hospital.  We will continue to assess this further in clinic with x-ray imaging.

## 2019-10-31 NOTE — TOC Progression Note (Signed)
Transition of Care [facility]) - Progression Note    Patient Details  Name: Becky Trevino MRN: [id number] Date of Birth: 1946-07-16  Transition of Care [facility]) CM/SW Contact  Beverly Sessions, RN Phone Number: 10/31/2019, 1:17 PM  Clinical Narrative:    Notified by ID pharmacist that patient would require IV antibiotics at discharge  Referral made to [facility] with [facility]  [place] with [facility] notified    Expected Discharge Plan: [place] Barriers to Discharge: No Barriers Identified  Expected Discharge Plan and Services Expected Discharge Plan: [place] arrangements for the past 2 months: Single Family Home                                       Social Determinants of Health (SDOH) Interventions    Readmission Risk Interventions No flowsheet data found.

## 2019-10-31 NOTE — Progress Notes (Signed)
Physical Therapy Treatment Patient Details Name: Becky Trevino MRN: [id number] DOB: January 10, 1946 Today's Date: 10/31/2019    History of Present Illness Pt admitted with R LE cellulitis with complaints of confusion and dyspnea. HIstory includes R ankle fx s/p ORIF on 12.18.20, DM, and HTN. Pt with positive DVT, started on anticoag this date.    PT Comments    Pt is making limited progress towards goals with ability to perform seated there-ex correctly. Pt already in recliner this date. Will continue to progress as able.   Follow Up Recommendations  Outpatient PT     Equipment Recommendations  None recommended by PT    Recommendations for Other Services       Precautions / Restrictions Precautions Precautions: Fall Restrictions Weight Bearing Restrictions: Yes RLE Weight Bearing: Non weight bearing    Mobility  Bed Mobility               General bed mobility comments: not performed as pt received in recliner  Transfers                 General transfer comment: not performed secondary to pain  Ambulation/Gait                 Stairs             Wheelchair Mobility    Modified Rankin (Stroke Patients Only)       Balance                                            Cognition Arousal/Alertness: Awake/alert Behavior During Therapy: WFL for tasks assessed/performed Overall Cognitive Status: Within Functional Limits for tasks assessed                                        Exercises Other Exercises Other Exercises: seated ther-ex performed on B LE including quad sets, R LE SLRs, hip abd/add, and L ankle pumps. Needs min assist for R LE and supervision for L LE    General Comments        Pertinent Vitals/Pain Pain Assessment: 0-10 Pain Score: 5  Pain Location: L thigh Pain Descriptors / Indicators: Aching Pain Intervention(s): Limited activity within patient's tolerance    Home Living                      Prior Function            PT Goals (current goals can now be found in the care plan section) Acute Rehab PT Goals Patient Stated Goal: to go home PT Goal Formulation: With patient Time For Goal Achievement: 11/11/19 Potential to Achieve Goals: Good Progress towards PT goals: Progressing toward goals    Frequency    Min 2X/week      PT Plan Current plan remains appropriate    Co-evaluation              AM-PAC PT "6 Clicks" Mobility   Outcome Measure  Help needed turning from your back to your side while in a flat bed without using bedrails?: None Help needed moving from lying on your back to sitting on the side of a flat bed without using bedrails?: A Little Help needed moving to and from a bed to a chair (including  a wheelchair)?: A Little Help needed standing up from a chair using your arms (e.g., wheelchair or bedside chair)?: A Little Help needed to walk in hospital room?: A Lot Help needed climbing 3-5 steps with a railing? : A Lot 6 Click Score: 17    End of Session   Activity Tolerance: Patient limited by pain Patient left: in chair;with chair alarm set Nurse Communication: Mobility status PT Visit Diagnosis: Muscle weakness (generalized) (M62.81);Difficulty in walking, not elsewhere classified (R26.2);Unsteadiness on feet (R26.81)     Time: [id number] PT Time Calculation (min) (ACUTE ONLY): 8 min  Charges:  $Therapeutic Exercise: 8-22 mins                     Becky Trevino, PT, DPT [phone]    Becky Trevino 10/31/2019, 12:55 PM

## 2019-10-31 NOTE — Progress Notes (Signed)
PROGRESS NOTE    Becky Trevino  [id number] DOB: Oct 27, 1945 DOA: 10/27/2019  PCP: [name], MD    LOS - 4   Brief Narrative:  74 year old female with history of type 2 diabetes, hypertension, hyperlipidemia, degenerative disc disease, right ankle fracture in December status post ORIF on 12/18. Presented to the ED with confusion and shortness of breath on evening of 2/11. She had been discharged on Lovenox for DVT prophylaxis which had been completed. She has previously been on Xarelto for thrombophlebitis but was no longer taking this. She was found to have right lower extremity DVT and associated cellulitis. She was admitted and started on therapeutic Lovenox and IV antibiotics. Hematology consulted and agree with therapeutic dose of Lovenox well admitted and DOAC on discharge preferably Eliquis but patient would like to resume Xarelto until she sees her cardiologist. Patient has also had a wound associated with the ankle surgery, had wound VAC. Podiatry saw and evaluated patient and reviewed x-rays which show fractures of healed and hardware intact, no signs of osteomyelitis. Patient's blood cultures subsequently returned positive for group B streptococcus. Antibiotics changed to Unasyn and infectious disease consulted.  On Unasyn pending final culture data.  Subjective 2/15: Patient seen this morning, up in chair.  No acute events reported overnight.  She reports severe anterior left thigh pain today.  No source of injury or trauma.  Describes it as deep and aching, but is nontender on palpation.  Denies fevers or chills.     Assessment & Plan:   Principal Problem:   Bacteremia due to group B Streptococcus Active Problems:   Cellulitis of right lower extremity   Acute deep vein thrombosis (DVT) of right lower extremity (HCC)   Chronic kidney disease (CKD), stage III (moderate)   Type 2 diabetes mellitus with diabetic neuropathy, without long-term current use of insulin  (HCC)   Left thigh pain -new.  Unclear etiology at this time.  Patient did have a left gluteal hematoma recently which was evacuated by orthopedics at prior admission. --Ortho consulted --Imaging to evaluate further: X-rays of lumbar spine, hip/pelvis and thigh, CT with contrast of left hip to evaluate for hematoma recurrence --Follow-up imaging --Pain management --Continue PT as tolerated  Group B streptococcus bacteremia Right lower extremity cellulitis,mild, nonpurulent Suspected bacteremia secondary to cellulitis versus possible osteomyelitis associated with recent surgery and hardware,however overlying wound does not appear infected. --Continue Unasyn --ID following --Follow-up repeat blood cultures --Wound cultures negative to date,follow for final results --Will require a few weeks of IV antibiotic therapy  Right lower extremity DVT-provoked by recent right ankle surgery and secondary immobility --Continue therapeutic Lovenox while admitted --Xarelto on discharge  Right lower extremity wound status post right ankle ORIF Wound does not appear infected. Please refer to Dr. Sherre Lain of 2/12 --Continues to be nonweightbearing on RLE --X-rays show well-healed fracture and intact hardware --Possible CT scan of the right ankle to evaluate for osteomyelitis if no other source of infection to account for bacteremia  Mild AKI superimposed on CKD stage IIIa-likely due to mild dehydration, prerenal azotemia. AKI improved with IV hydration. 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  Doxy  on hold on Unasyn.   DVT prophylaxis:On full dose Lovenox Code Status: Full Code Family Communication:None at bedside  Disposition Plan:Pending further clinical improvement and negative repeat blood cultures, clearance by podiatry and ID.Also pending further work-up of new onset left thigh pain Coming Fromhome Exp DC Date:2/15-16 earliest Barriers:Repeat blood cultures for bacteremia,potential further evaluation for osteomyelitis Medically Stable for Discharge?No  Consultants:  Infectious disease  Podiatry  Hematology  Procedures:  None  Antimicrobials:  Rocephin 2/11 PM>>2/12  Unasyn 2/12>>>TBD   Objective: Vitals:   10/30/19 1147 10/30/19 2025 10/31/19 0511 10/31/19 1144  BP: 136/70 (!) 141/68 115/86 133/77  Pulse: 75 72 76 65  Resp:  20 20   Temp: 97.7 F (36.5 C) 97.9 F (36.6 C) 98.4 F (36.9 C) 97.8 F (36.6 C)  TempSrc: Oral Oral Oral Oral  SpO2: 100% 99% 97% 97%  Weight:      Height:        Intake/Output Summary (Last 24 hours) at 10/31/2019 1532 Last data filed at 10/30/2019 1830 Gross per 24 hour  Intake 240 ml  Output --  Net 240 ml   Filed Weights   10/27/19 2108 10/28/19 0053  Weight: 114.8 kg 116.2 kg    Examination:  General exam: awake, alert, no acute distress, obese HEENT: moist mucus membranes, hearing grossly normal  Respiratory system: CTAB, no wheezes, rales or rhonchi, normal respiratory effort. Cardiovascular system: normal S1/S2, RRR, no JVD, murmurs, rubs, gallops, bilateral lower extremity edema.   Central nervous system: A&O x4. no gross focal neurologic deficits, normal speech, lower extremity motor exam limited due to pain but appears intact and equal Extremities: Right lower extremity in boot and dressing clean dry intact, anterior left thigh nontender and without palpable warmth, induration, swelling, fluctuance Psychiatry: normal mood, congruent affect, judgement and insight  appear normal    Data Reviewed: I have personally reviewed following labs and imaging studies  CBC: Recent Labs  Lab 10/27/19 2118 10/29/19 0527 10/30/19 0502 10/31/19 0526  WBC 16.7* 9.2 7.3 6.9  NEUTROABS 15.3*  --   --   --   HGB 12.9 11.0* 11.0* 11.2*  HCT 40.9 34.2* 34.7* 34.9*  MCV 92.3 91.9 93.3 90.6  PLT 131* 98* 118* [id number]*   Basic Metabolic Panel: Recent Labs  Lab 10/27/19 2118 10/29/19 0527 10/30/19 0502 10/31/19 0526  NA 134* 138 139 138  K 4.0 3.4* 3.9 3.5  CL 100 106 105 105  CO2 23 23 27 24   GLUCOSE 121* 88 90 90  BUN 18 14 12 11   CREATININE 1.27* 1.04* 1.04* 0.93  CALCIUM 9.6 8.5* 8.8* 8.8*   GFR: Estimated Creatinine Clearance: 74.6 mL/min (by C-G formula based on SCr of 0.93 mg/dL). Liver Function Tests: Recent Labs  Lab 10/27/19 2118  AST 19  ALT 16  ALKPHOS 121  BILITOT 0.8  PROT 8.5*  ALBUMIN 3.9   No results for input(s): LIPASE, AMYLASE in the last 168 hours. No results for input(s): AMMONIA in the last 168 hours. Coagulation Profile: Recent Labs  Lab 10/28/19 0623  INR 1.2   Cardiac Enzymes: No results for input(s): CKTOTAL, CKMB, CKMBINDEX, TROPONINI in the last 168 hours. BNP (last 3 results) No results for input(s): PROBNP in the last 8760 hours. HbA1C: No results for input(s): HGBA1C in the last 72 hours. CBG: Recent Labs  Lab 10/30/19 0737 10/30/19 1145 10/30/19 2122 10/31/19 0755 10/31/19 1142  GLUCAP 82 79 96 85 95   Lipid Profile: No results for input(s): CHOL,  HDL, LDLCALC, TRIG, CHOLHDL, LDLDIRECT in the last 72 hours. Thyroid Function Tests: No results for input(s): TSH, T4TOTAL, FREET4, T3FREE, THYROIDAB in the last 72 hours. Anemia Panel: No results for input(s): VITAMINB12, FOLATE, FERRITIN, TIBC, IRON, RETICCTPCT in the last 72 hours. Sepsis Labs: Recent Labs  Lab 10/27/19 2118 10/27/19 2323  LATICACIDVEN 1.8 1.7    Recent Results (from the past 240 hour(s))  Culture, blood (routine x 2)      Status: Abnormal   Collection Time: 10/27/19  9:18 PM   Specimen: BLOOD  Result Value Ref Range Status   Specimen Description   Final    BLOOD LEFT ANTECUBITAL Performed at [facility], [address], [place], [place] [address]    Special Requests   Final    BOTTLES DRAWN AEROBIC AND ANAEROBIC Blood Culture adequate volume Performed at [facility], [address], [place], [place] [address]    Culture  Setup Time   Final    GRAM POSITIVE COCCI IN BOTH AEROBIC AND ANAEROBIC BOTTLES CRITICAL RESULT CALLED TO, READ BACK BY AND VERIFIED WITH: KRISTEN MERRILL AT 1005 10/28/19.PMF Performed at [facility], [place] [address], [place], [place] [address]    Culture GROUP B STREP(S.AGALACTIAE)ISOLATED (A)  Final   Report Status 10/30/2019 FINAL  Final   Organism ID, Bacteria GROUP B STREP(S.AGALACTIAE)ISOLATED  Final      Susceptibility   Group b strep(s.agalactiae)isolated - MIC*    CLINDAMYCIN <=0.25 SENSITIVE Sensitive     AMPICILLIN <=0.25 SENSITIVE Sensitive     ERYTHROMYCIN <=0.12 SENSITIVE Sensitive     VANCOMYCIN 0.5 SENSITIVE Sensitive     CEFTRIAXONE <=0.12 SENSITIVE Sensitive     LEVOFLOXACIN 1 SENSITIVE Sensitive     * GROUP B STREP(S.AGALACTIAE)ISOLATED  Blood Culture ID Panel (Reflexed)     Status: Abnormal   Collection Time: 10/27/19  9:18 PM  Result Value Ref Range Status   Enterococcus species NOT DETECTED NOT DETECTED Final   Listeria monocytogenes NOT DETECTED NOT DETECTED Final   Staphylococcus species NOT DETECTED NOT DETECTED Final   Staphylococcus aureus (BCID) NOT DETECTED NOT DETECTED Final   Streptococcus species DETECTED (A) NOT DETECTED Final    Comment: CRITICAL RESULT CALLED TO, READ BACK BY AND VERIFIED WITH: KRISTEN MERRILL AT 1005 10/28/19.PMF    Streptococcus agalactiae DETECTED (A) NOT DETECTED Final    Comment: CRITICAL RESULT CALLED TO, READ BACK BY AND VERIFIED WITH: KRISTEN MERRILL AT 1005 10/28/19.PMF    Streptococcus  pneumoniae NOT DETECTED NOT DETECTED Final   Streptococcus pyogenes NOT DETECTED NOT DETECTED Final   Acinetobacter baumannii NOT DETECTED NOT DETECTED Final   Enterobacteriaceae species NOT DETECTED NOT DETECTED Final   Enterobacter cloacae complex NOT DETECTED NOT DETECTED Final   Escherichia coli NOT DETECTED NOT DETECTED Final   Klebsiella oxytoca NOT DETECTED NOT DETECTED Final   Klebsiella pneumoniae NOT DETECTED NOT DETECTED Final   Proteus species NOT DETECTED NOT DETECTED Final   Serratia marcescens NOT DETECTED NOT DETECTED Final   Haemophilus influenzae NOT DETECTED NOT DETECTED Final   Neisseria meningitidis NOT DETECTED NOT DETECTED Final   Pseudomonas aeruginosa NOT DETECTED NOT DETECTED Final   Candida albicans NOT DETECTED NOT DETECTED Final   Candida glabrata NOT DETECTED NOT DETECTED Final   Candida krusei NOT DETECTED NOT DETECTED Final   Candida parapsilosis NOT DETECTED NOT DETECTED Final   Candida tropicalis NOT DETECTED NOT DETECTED Final    Comment: Performed at [facility], [place],  [place], [place] [address]  Culture, blood (routine x 2)     Status: Abnormal   Collection Time: 10/27/19  9:23 PM   Specimen: BLOOD  Result Value Ref Range Status   Specimen Description   Final    BLOOD BLOOD LEFT HAND Performed at [facility], [address] [place], [place] [address]    Special Requests   Final    BOTTLES DRAWN AEROBIC AND ANAEROBIC Blood Culture adequate volume Performed at [facility], [address] [place], [place] [address]    Culture  Setup Time   Final    GRAM POSITIVE COCCI IN BOTH AEROBIC AND ANAEROBIC BOTTLES CRITICAL RESULT CALLED TO, READ BACK BY AND VERIFIED WITH: KRISTEN MERRILL AT 1005 10/28/19.PMF Performed at [facility], [address] [place], [place] [address]    Culture (A)  Final    STREPTOCOCCUS AGALACTIAE SUSCEPTIBILITIES PERFORMED ON PREVIOUS CULTURE WITHIN THE LAST 5  DAYS. Performed at [facility], [place] [address] [place], [place] [address]    Report Status 10/30/2019 FINAL  Final  SARS CORONAVIRUS 2 (TAT 6-24 HRS) Nasopharyngeal Nasopharyngeal Swab     Status: None   Collection Time: 10/27/19 11:07 PM   Specimen: Nasopharyngeal Swab  Result Value Ref Range Status   SARS Coronavirus 2 NEGATIVE NEGATIVE Final    Comment: (NOTE) SARS-CoV-2 target nucleic acids are NOT DETECTED. The SARS-CoV-2 RNA is generally detectable in upper and lower respiratory specimens during the acute phase of infection. Negative results do not preclude SARS-CoV-2 infection, do not rule out co-infections with other pathogens, and should not be used as the sole basis for treatment or other patient management decisions. Negative results must be combined with clinical observations, patient history, and epidemiological information. The expected result is Negative. Fact Sheet for Patients: SugarRoll.be Fact Sheet for Healthcare Providers: https://www.woods-mathews.com/ This test is not yet approved or cleared by the [place] FDA and  has been authorized for detection and/or diagnosis of SARS-CoV-2 by FDA under an Emergency Use Authorization (EUA). This EUA will remain  in effect (meaning this test can be used) for the duration of the COVID-19 declaration under Section 56 4(b)(1) of the Act, 21 U.S.C. section 360bbb-3(b)(1), unless the authorization is terminated or revoked sooner. Performed at [facility], [place] [address] [place], [place] [address]   Aerobic/Anaerobic Culture (surgical/deep wound)     Status: None (Preliminary result)   Collection Time: 10/28/19 12:30 PM   Specimen: Wound  Result Value Ref Range Status   Specimen Description   Final    WOUND LEG RIGHT Performed at [facility], [address], [place], [place] [address]    Special Requests   Final    Normal Performed at [facility], [address] [place], [place] [address]    Gram Stain   Final    NO WBC SEEN MODERATE GRAM POSITIVE COCCI MODERATE GRAM POSITIVE RODS Performed at [facility], [place] [address], [place],  [address]    Culture   Final    FEW STREPTOCOCCUS AGALACTIAE TESTING AGAINST S. AGALACTIAE NOT ROUTINELY PERFORMED DUE TO PREDICTABILITY OF AMP/PEN/VAN SUSCEPTIBILITY. RARE ACINETOBACTER CALCOACETICUS/BAUMANNII COMPLEX    Report Status PENDING  Incomplete   Organism ID, Bacteria ACINETOBACTER CALCOACETICUS/BAUMANNII COMPLEX  Final      Susceptibility   Acinetobacter calcoaceticus/baumannii complex - MIC*    CEFTAZIDIME 4 SENSITIVE Sensitive     CIPROFLOXACIN >=4 RESISTANT Resistant     GENTAMICIN 2 SENSITIVE  Sensitive     IMIPENEM <=0.25 SENSITIVE Sensitive     PIP/TAZO <=4 SENSITIVE Sensitive     TRIMETH/SULFA <=20 SENSITIVE Sensitive     AMPICILLIN/SULBACTAM <=2 SENSITIVE Sensitive     * RARE ACINETOBACTER CALCOACETICUS/BAUMANNII COMPLEX  CULTURE, BLOOD (ROUTINE X 2) w Reflex to ID Panel     Status: None (Preliminary result)   Collection Time: 10/29/19  1:44 PM   Specimen: BLOOD  Result Value Ref Range Status   Specimen Description BLOOD [facility]  Final   Special Requests   Final    BOTTLES DRAWN AEROBIC AND ANAEROBIC Blood Culture adequate volume   Culture   Final    NO GROWTH 2 DAYS Performed at [facility], [address], [place], [place] [address]    Report Status PENDING  Incomplete  CULTURE, BLOOD (ROUTINE X 2) w Reflex to ID Panel     Status: None (Preliminary result)   Collection Time: 10/29/19  1:46 PM   Specimen: BLOOD  Result Value Ref Range Status   Specimen Description BLOOD  LAC  Final   Special Requests   Final    BOTTLES DRAWN AEROBIC AND ANAEROBIC Blood Culture adequate volume   Culture   Final    NO GROWTH 2 DAYS Performed at [facility], [address] [place], [place] [address]    Report Status PENDING  Incomplete          Radiology Studies: CT ANKLE RIGHT W CONTRAST  Result Date: 10/31/2019 CLINICAL DATA:  Dehiscence of the surgical wound along the right lateral ankle. Prior plate and screw fixation of the lateral malleolus. EXAM: CT OF THE RIGHT ANKLE WITH CONTRAST TECHNIQUE: Multidetector CT imaging of the right ankle was performed following the standard protocol during bolus administration of intravenous contrast. CONTRAST:  12mL OMNIPAQUE IOHEXOL 300 MG/ML  SOLN COMPARISON:  Radiographs from 10/28/2019 FINDINGS: Bones/Joint/Cartilage Lateral malleolar plate and screw fixator traversing the oblique fracture, with lack of bony bridging and early cortication along the fracture plane favoring early nonunion. Concern was raised on radiography for lucency around the screws but currently no abnormal lucency is observed around the 5 screws proximal to the fracture or 2 screws distal to the fracture to favor loosening or infection. No bony destructive findings are identified. Small fragments along the anterior margin of the fracture plane indicate mild prior comminution There is periosteal reaction along the posterior tibial metaphysis along with a corticated ossicle along the posteroinferior tibial rim indicating a likely small posterior malleolar fracture component of the prior injury, favoring at least a stage III Weber B injury. There is a small ossicle below the medial malleolus but it appears well corticated and does not encompass the entire attachment of the deep tibiotalar portion of the deltoid ligament., which grossly appears intact, implying that this was probably not a stage IV Weber B injury. Mild irregular spurring with some underlying lucency in the medial talar dome posteriorly for example on images 80-85 of series 6, favoring degenerative spurring and potentially a small sagittally oriented non-fragmented chronic osteochondral injury. Degenerative arthropathy of the Lisfranc joint medially; only part of  the Lisfranc joint is included on today's examination but there is clearly considerable spurring and loss of articular space between the medial cuneiform and base of the first metatarsal, and the middle cuneiform and the base of the second metatarsal. Please note that the Lisfranc joint is not included in its entirety. Mild articular surface irregularity along the posterior tibial plafond for example on  image 44/7. Plantar calcaneal spur noted. Ligaments Suboptimally assessed by CT. My sense on image 83/9 is that the deep tibiotalar portion of the deltoid ligament is probably intact, although there is some indistinctness and surrounding edema. The tibionavicular and tibiospring portions of deltoid ligament are obscured by surrounding edema. Small bony fragments just above the expected vicinity of the anterior talofibular ligament. Muscles and Tendons No flexor tendon entrapment. Expanded contour of the tibialis posterior tendon compatible with tendinopathy. Soft tissues Subcutaneous edema surrounding the ankle and tracking in the dorsum of the foot and the heel. There is some confluent mild cutaneous irregularity overlying the lateral plate and screw fixator for example on image 51/8 at least partially from overlying bandaging, but reportedly this is the region of the cutaneous wound. I do not see any gas tracking in the soft tissues. I do not observe a drainable rim enhancing fluid collection to suggest abscess. IMPRESSION: 1. Lateral malleolar plate and screw fixator traversing the oblique fracture, with lack of bony bridging and early cortication along the fracture plane favoring early nonunion. There is also some periosteal reaction along the posterior tibial metaphysis compatible with a small posterior malleolar fracture component of the prior injury. Appearance compatible with previous Weber B stage III fracture. 2. No current lucency along the screws of the plate and screw fixator to suggest loosening or  infection around the screws. 3. Small ossicle below the medial malleolus appears well corticated and does not encompass the entire attachment of the deep tibiotalar portion of the deltoid ligament. 4. Degenerative arthropathy of the Lisfranc joint medially, only part of the Lisfranc joint is included on today's examination. 5. Plantar calcaneal spur. 6. Expanded contour of the tibialis posterior tendon compatible with tendinopathy. Correlate clinically in assessing for tibialis posterior dysfunction. 7. Subcutaneous edema surrounding the ankle and tracking in the dorsum of the foot and heel. No drainable abscess is identified. No gas tracking in the soft tissues. 8. Mild articular surface irregularity along the posterior tibial plafond, query a small sagittally oriented non-fragmented chronic osteochondral injury. Electronically Signed   By: Van Clines M.D.   On: 10/31/2019 08:23        Scheduled Meds: . atorvastatin  40 mg Oral QPM  . brimonidine  1 drop Both Eyes TID   And  . brinzolamide  1 drop Both Eyes TID  . calcium-vitamin D  1 tablet Oral BID  . enoxaparin (LOVENOX) injection  115 mg Subcutaneous Q12H  . fluticasone  2 spray Each Nare Daily  . magnesium oxide  400 mg Oral Daily  . metoprolol succinate  100 mg Oral QHS  . midodrine  5 mg Oral TID PC  . mirabegron ER  25 mg Oral Daily   Continuous Infusions: . sodium chloride 100 mL/hr at 10/30/19 1743  . ampicillin-sulbactam (UNASYN) IV 3 g (10/31/19 1318)     LOS: 4 days    Time spent: 40 to 45 minutes    Ezekiel Slocumb, DO [facility]   If 7PM-7AM, please contact night-coverage [email or web address] 10/31/2019, 3:32 PM

## 2019-11-01 ENCOUNTER — Encounter: Payer: Self-pay | Admitting: Family Medicine

## 2019-11-01 ENCOUNTER — Encounter
Admission: EM | Disposition: A | Discharge status: Skilled Nursing Facility | Payer: Self-pay | Source: Home / Self Care | Attending: Internal Medicine

## 2019-11-01 ENCOUNTER — Other Ambulatory Visit (INDEPENDENT_AMBULATORY_CARE_PROVIDER_SITE_OTHER): Payer: Self-pay | Admitting: Vascular Surgery

## 2019-11-01 ENCOUNTER — Inpatient Hospital Stay: Payer: BC Managed Care – PPO

## 2019-11-01 DIAGNOSIS — Z96642 Presence of left artificial hip joint: Secondary | ICD-10-CM

## 2019-11-01 DIAGNOSIS — Z96651 Presence of right artificial knee joint: Secondary | ICD-10-CM

## 2019-11-01 DIAGNOSIS — T8131XA Disruption of external operation (surgical) wound, not elsewhere classified, initial encounter: Secondary | ICD-10-CM

## 2019-11-01 DIAGNOSIS — Z794 Long term (current) use of insulin: Secondary | ICD-10-CM

## 2019-11-01 DIAGNOSIS — I70233 Atherosclerosis of native arteries of right leg with ulceration of ankle: Secondary | ICD-10-CM

## 2019-11-01 DIAGNOSIS — E1122 Type 2 diabetes mellitus with diabetic chronic kidney disease: Secondary | ICD-10-CM

## 2019-11-01 DIAGNOSIS — N189 Chronic kidney disease, unspecified: Secondary | ICD-10-CM

## 2019-11-01 DIAGNOSIS — I82401 Acute embolism and thrombosis of unspecified deep veins of right lower extremity: Secondary | ICD-10-CM

## 2019-11-01 LAB — AEROBIC/ANAEROBIC CULTURE W GRAM STAIN (SURGICAL/DEEP WOUND)
Gram Stain: NONE SEEN
Special Requests: NORMAL

## 2019-11-01 LAB — BASIC METABOLIC PANEL
Anion gap: 8 (ref 5–15)
BUN: 9 mg/dL (ref 8–23)
CO2: 27 mmol/L (ref 22–32)
Calcium: 9 mg/dL (ref 8.9–10.3)
Chloride: 103 mmol/L (ref 98–111)
Creatinine, Ser: 0.89 mg/dL (ref 0.44–1.00)
GFR calc Af Amer: >60 mL/min (ref >60)
GFR calc non Af Amer: >60 mL/min (ref >60)
Glucose, Bld: 107 mg/dL — ABNORMAL HIGH (ref 70–99)
Potassium: 3.8 mmol/L (ref 3.5–5.1)
Sodium: 138 mmol/L (ref 135–145)

## 2019-11-01 LAB — CBC
HCT: 34.3 % — ABNORMAL LOW (ref 36.0–46.0)
Hemoglobin: 10.9 g/dL — ABNORMAL LOW (ref 12.0–15.0)
MCH: 29.2 pg (ref 26.0–34.0)
MCHC: 31.8 g/dL (ref 30.0–36.0)
MCV: 92 fL (ref 80.0–100.0)
Platelets: 158 10*3/uL (ref 150–400)
RBC: 3.73 MIL/uL — ABNORMAL LOW (ref 3.87–5.11)
RDW: 16 % — ABNORMAL HIGH (ref 11.5–15.5)
WBC: 9.9 10*3/uL (ref 4.0–10.5)
nRBC: 0 % (ref 0.0–0.2)

## 2019-11-01 LAB — CK
Total CK: 541 U/L — ABNORMAL HIGH (ref 38–234)
Total CK: 654 U/L — ABNORMAL HIGH (ref 38–234)

## 2019-11-01 LAB — GLUCOSE, CAPILLARY
Glucose-Capillary: 107 mg/dL — ABNORMAL HIGH (ref 70–99)
Glucose-Capillary: 113 mg/dL — ABNORMAL HIGH (ref 70–99)
Glucose-Capillary: 116 mg/dL — ABNORMAL HIGH (ref 70–99)
Glucose-Capillary: 117 mg/dL — ABNORMAL HIGH (ref 70–99)
Glucose-Capillary: 86 mg/dL (ref 70–99)
Glucose-Capillary: 88 mg/dL (ref 70–99)

## 2019-11-01 SURGERY — IVC FILTER INSERTION
Anesthesia: Moderate Sedation

## 2019-11-01 MED ORDER — IOHEXOL 300 MG/ML  SOLN
100.0000 mL | Freq: Once | INTRAMUSCULAR | Status: AC | PRN
  Administered 2019-11-01: 100 mL via INTRAVENOUS

## 2019-11-01 NOTE — Progress Notes (Signed)
PROGRESS NOTE    SINDI DUDDY  [id number] DOB: [birth year] DOA: 10/27/2019  PCP: [name], MD    LOS - 5   Brief Narrative:  74 year old female with history of type 2 diabetes, hypertension, hyperlipidemia, degenerative disc disease, right ankle fracture in December status post ORIF on 12/18. Presented to the ED with confusion and shortness of breath on evening of 2/11. She had been discharged on Lovenox for DVT prophylaxis which had been completed. She has previously been on Xarelto for thrombophlebitis but was no longer taking this. She was found to have right lower extremity DVT and associated cellulitis. She was admitted and started on therapeutic Lovenox and IV antibiotics. Hematology consulted and agree with therapeutic dose of Lovenox well admitted and DOAC on discharge preferably Eliquis but patient would like to resume Xarelto until she sees her cardiologist. Patient has also had a wound associated with the ankle surgery, had wound VAC. Podiatry saw and evaluated patient and reviewed x-rays which showed no signs of osteomyelitis. Patient's blood cultures subsequently returned positive for group B streptococcus. Antibiotics changed to Unasyn and infectious disease consulted.On Unasyn pending final culture data.  2/15: severe left thigh and hip pain with thigh numbness.  Given prior left gluteal hematoma in November, obtained CT that showed a likely iliopsoas hematoma.  Lovenox stopped.  IVC filter placed 2/16.     Subjective 2/16: Patient seen this morning, sitting up in bed.  Per report, did not have any significant increase in her left hip and thigh pain overnight.  This morning, patient's RN reported the distal left lower extremity to be cold to touch but with good strong pulse.  This morning reporting anterior thigh numbness more than pain.  She does get pain when turning or rolling to her left side or when flexing the left hip.  She denies fevers or chills, nausea  vomiting diarrhea, chest pain or shortness of breath.  No other acute events reported.  Assessment & Plan:   Principal Problem:   Bacteremia due to group B Streptococcus Active Problems:   Cellulitis of right lower extremity   Acute deep vein thrombosis (DVT) of right lower extremity (HCC)   Chronic kidney disease (CKD), stage III (moderate)   Type 2 diabetes mellitus with diabetic neuropathy, without long-term current use of insulin (HCC)   Left thigh/hip pain secondary to likely iliopsoas hematoma -new.  Patient did have a left gluteal hematoma recently which was evacuated by orthopedics at prior admission (while on Xarelto). This time it developed while on full dose Lovenox for RLE DVT.   --Ortho, vascular surgery consulted --Lovenox for DVT stopped --IVC filter placed to prevent PE --Pain management --Continue PT as tolerated --Radiology recommended CT abd/pelvis to better visualize   Group B streptococcus bacteremia Right lower extremity cellulitis,mild, nonpurulent Suspected bacteremia secondary to cellulitis versus possible osteomyelitis associated with recent surgery and hardware,however overlying wound does not appear infected. --Continue Unasyn --ID following --Follow-up repeat blood cultures --Wound cultures negative to date,follow for final results --Will require a few weeks of IV antibiotic therapy  Right lower extremity DVT-provoked by recent right ankle surgery and secondary immobility.  Off anticoagulation due to probable hematoma.   --IVC filter placed 2/16  Right lower extremity wound status post right ankle ORIF Wound does not appear infected. Please refer to Dr. Sherre Lain of 2/12 --Continues to be nonweightbearing on RLE --RLE angiogram planned for 2/17 to evaluate for PAD  History of right total knee arthroplasty with prosthetic joint infectionon suppressive  doxycycline due to retained hardware.Doxy on hold on Unasyn.  Mild AKI  superimposed on CKD stage IIIa-likely due to mild dehydration, prerenal azotemia. AKI improved with IV hydration. --Monitor BMP --Avoid renal toxic agents and renally dose meds as indicated   Type 2 diabetes-  --NovoLog 3 units 3 times daily with meals --Sliding scale NovoLog --Monitor blood glucose and titrate insulin as needed --Appears not to be on medications outpatient  Hyperlipidemia-continue atorvastatin    DVT prophylaxis:On full dose Lovenox Code Status: Full Code Family Communication:None at bedside  Disposition Plan:Pending further clinical improvement and negative repeat blood cultures, clearance by podiatry and ID.Also pending further work-up of new onset left thigh pain Coming Fromhome Exp DC Date:2/15-16 earliest Barriers:Repeat blood cultures for bacteremia,potential further evaluation for osteomyelitis Medically Stable for Discharge?No  Consultants:  Infectious disease  Podiatry  Hematology  Vascular surgery  Procedures:  None  Antimicrobials:  Rocephin 2/11 PM>>2/12  Unasyn 2/12>>>TBD   Objective: Vitals:   10/31/19 0511 10/31/19 1144 10/31/19 1932 11/01/19 0439  BP: 115/86 133/77 (!) 158/92 (!) 111/56  Pulse: 76 65 69 77  Resp: 20  18 16   Temp: 98.4 F (36.9 C) 97.8 F (36.6 C) 97.8 F (36.6 C) 98 F (36.7 C)  TempSrc: Oral Oral Oral Oral  SpO2: 97% 97% 96% 100%  Weight:      Height:        Intake/Output Summary (Last 24 hours) at 11/01/2019 0731 Last data filed at 11/01/2019 [id number] Gross per 24 hour  Intake 2185.68 ml  Output 1900 ml  Net 285.68 ml   Filed Weights   10/27/19 2108 10/28/19 0053  Weight: 114.8 kg 116.2 kg    Examination:  General exam: awake, alert, no acute distress, obese HEENT: atraumatic, clear conjunctiva, anicteric sclera, moist mucus membranes, hearing grossly normal  Respiratory system: CTAB, no wheezes, rales or rhonchi, normal respiratory effort. Cardiovascular  system: normal S1/S2, RRR, b/l LE edema stable.   Gastrointestinal system: soft, NT, ND Central nervous system: A&O x4. no gross focal neurologic deficits, normal speech Extremities: moves all, no edema, normal tone, strong DP pulse on left, left foot and distal leg slightly cool to touch, right foot dressing intact Skin: dry, intact, normal temperature, normal color Psychiatry: normal mood, congruent affect, judgement and insight appear normal    Data Reviewed: I have personally reviewed following labs and imaging studies  CBC: Recent Labs  Lab 10/27/19 2118 10/29/19 0527 10/30/19 0502 10/31/19 0526 11/01/19 0159  WBC 16.7* 9.2 7.3 6.9 9.9  NEUTROABS 15.3*  --   --   --   --   HGB 12.9 11.0* 11.0* 11.2* 10.9*  HCT 40.9 34.2* 34.7* 34.9* 34.3*  MCV 92.3 91.9 93.3 90.6 92.0  PLT 131* 98* 118* 146* 0000000   Basic Metabolic Panel: Recent Labs  Lab 10/27/19 2118 10/29/19 0527 10/30/19 0502 10/31/19 0526 11/01/19 0159  NA 134* 138 139 138 138  K 4.0 3.4* 3.9 3.5 3.8  CL 100 106 105 105 103  CO2 23 23 27 24 27   GLUCOSE 121* 88 90 90 107*  BUN 18 14 12 11 9   CREATININE 1.27* 1.04* 1.04* 0.93 0.89  CALCIUM 9.6 8.5* 8.8* 8.8* 9.0   GFR: Estimated Creatinine Clearance: 77.9 mL/min (by C-G formula based on SCr of 0.89 mg/dL). Liver Function Tests: Recent Labs  Lab 10/27/19 2118  AST 19  ALT 16  ALKPHOS 121  BILITOT 0.8  PROT 8.5*  ALBUMIN 3.9   No results for input(s): LIPASE,  AMYLASE in the last 168 hours. No results for input(s): AMMONIA in the last 168 hours. Coagulation Profile: Recent Labs  Lab 10/28/19 0623  INR 1.2   Cardiac Enzymes: Recent Labs  Lab 11/01/19 0159 11/01/19 0630  CKTOTAL 541* 654*   BNP (last 3 results) No results for input(s): PROBNP in the last 8760 hours. HbA1C: No results for input(s): HGBA1C in the last 72 hours. CBG: Recent Labs  Lab 10/31/19 0755 10/31/19 1142 10/31/19 1618 10/31/19 2128 11/01/19 0441  GLUCAP 85 95 98  114* 86   Lipid Profile: No results for input(s): CHOL, HDL, LDLCALC, TRIG, CHOLHDL, LDLDIRECT in the last 72 hours. Thyroid Function Tests: No results for input(s): TSH, T4TOTAL, FREET4, T3FREE, THYROIDAB in the last 72 hours. Anemia Panel: No results for input(s): VITAMINB12, FOLATE, FERRITIN, TIBC, IRON, RETICCTPCT in the last 72 hours. Sepsis Labs: Recent Labs  Lab 10/27/19 2118 10/27/19 2323  LATICACIDVEN 1.8 1.7    Recent Results (from the past 240 hour(s))  Culture, blood (routine x 2)     Status: Abnormal   Collection Time: 10/27/19  9:18 PM   Specimen: BLOOD  Result Value Ref Range Status   Specimen Description   Final    BLOOD LEFT ANTECUBITAL Performed at [facility], [address] [place], [place] [address]    Special Requests   Final    BOTTLES DRAWN AEROBIC AND ANAEROBIC Blood Culture adequate volume Performed at [facility], [address], [place], [place] [address]    Culture  Setup Time   Final    GRAM POSITIVE COCCI IN BOTH AEROBIC AND ANAEROBIC BOTTLES CRITICAL RESULT CALLED TO, READ BACK BY AND VERIFIED WITH: KRISTEN MERRILL AT 1005 10/28/19.PMF Performed at [facility], [place] [address], [place],  [address]    Culture GROUP B STREP(S.AGALACTIAE)ISOLATED (A)  Final   Report Status 10/30/2019 FINAL  Final   Organism ID, Bacteria GROUP B STREP(S.AGALACTIAE)ISOLATED  Final      Susceptibility   Group b strep(s.agalactiae)isolated - MIC*    CLINDAMYCIN <=0.25 SENSITIVE Sensitive     AMPICILLIN <=0.25 SENSITIVE Sensitive     ERYTHROMYCIN <=0.12 SENSITIVE Sensitive     VANCOMYCIN 0.5 SENSITIVE Sensitive     CEFTRIAXONE <=0.12 SENSITIVE Sensitive     LEVOFLOXACIN 1 SENSITIVE Sensitive     * GROUP B STREP(S.AGALACTIAE)ISOLATED  Blood Culture ID Panel (Reflexed)     Status: Abnormal   Collection Time: 10/27/19  9:18 PM  Result Value Ref Range Status   Enterococcus species NOT DETECTED NOT DETECTED Final   Listeria  monocytogenes NOT DETECTED NOT DETECTED Final   Staphylococcus species NOT DETECTED NOT DETECTED Final   Staphylococcus aureus (BCID) NOT DETECTED NOT DETECTED Final   Streptococcus species DETECTED (A) NOT DETECTED Final    Comment: CRITICAL RESULT CALLED TO, READ BACK BY AND VERIFIED WITH: KRISTEN MERRILL AT 1005 10/28/19.PMF    Streptococcus agalactiae DETECTED (A) NOT DETECTED Final    Comment: CRITICAL RESULT CALLED TO, READ BACK BY AND VERIFIED WITH: KRISTEN MERRILL AT 1005 10/28/19.PMF    Streptococcus pneumoniae NOT DETECTED NOT DETECTED Final   Streptococcus pyogenes NOT DETECTED NOT DETECTED Final   Acinetobacter baumannii NOT DETECTED NOT DETECTED Final   Enterobacteriaceae species NOT DETECTED NOT DETECTED Final   Enterobacter cloacae complex NOT DETECTED NOT DETECTED Final   Escherichia coli NOT DETECTED NOT DETECTED Final   Klebsiella oxytoca NOT DETECTED NOT DETECTED Final   Klebsiella pneumoniae NOT DETECTED NOT DETECTED Final  Proteus species NOT DETECTED NOT DETECTED Final   Serratia marcescens NOT DETECTED NOT DETECTED Final   Haemophilus influenzae NOT DETECTED NOT DETECTED Final   Neisseria meningitidis NOT DETECTED NOT DETECTED Final   Pseudomonas aeruginosa NOT DETECTED NOT DETECTED Final   Candida albicans NOT DETECTED NOT DETECTED Final   Candida glabrata NOT DETECTED NOT DETECTED Final   Candida krusei NOT DETECTED NOT DETECTED Final   Candida parapsilosis NOT DETECTED NOT DETECTED Final   Candida tropicalis NOT DETECTED NOT DETECTED Final    Comment: Performed at [facility], [address] [place], [place] [address]  Culture, blood (routine x 2)     Status: Abnormal   Collection Time: 10/27/19  9:23 PM   Specimen: BLOOD  Result Value Ref Range Status   Specimen Description   Final    BLOOD BLOOD LEFT HAND Performed at [facility], [address] [place], [place] [address]    Special Requests   Final    BOTTLES DRAWN AEROBIC AND  ANAEROBIC Blood Culture adequate volume Performed at [facility], [address] [place], [place] [address]    Culture  Setup Time   Final    GRAM POSITIVE COCCI IN BOTH AEROBIC AND ANAEROBIC BOTTLES CRITICAL RESULT CALLED TO, READ BACK BY AND VERIFIED WITH: KRISTEN MERRILL AT 1005 10/28/19.PMF Performed at [facility], [address] [place], [place] [address]    Culture (A)  Final    STREPTOCOCCUS AGALACTIAE SUSCEPTIBILITIES PERFORMED ON PREVIOUS CULTURE WITHIN THE LAST 5 DAYS. Performed at [facility], [place] [address], [place], [place] [address]    Report Status 10/30/2019 FINAL  Final  SARS CORONAVIRUS 2 (TAT 6-24 HRS) Nasopharyngeal Nasopharyngeal Swab     Status: None   Collection Time: 10/27/19 11:07 PM   Specimen: Nasopharyngeal Swab  Result Value Ref Range Status   SARS Coronavirus 2 NEGATIVE NEGATIVE Final    Comment: (NOTE) SARS-CoV-2 target nucleic acids are NOT DETECTED. The SARS-CoV-2 RNA is generally detectable in upper and lower respiratory specimens during the acute phase of infection. Negative results do not preclude SARS-CoV-2 infection, do not rule out co-infections with other pathogens, and should not be used as the sole basis for treatment or other patient management decisions. Negative results must be combined with clinical observations, patient history, and epidemiological information. The expected result is Negative. Fact Sheet for Patients: SugarRoll.be Fact Sheet for Healthcare Providers: https://www.woods-mathews.com/ This test is not yet approved or cleared by the [place] FDA and  has been authorized for detection and/or diagnosis of SARS-CoV-2 by FDA under an Emergency Use Authorization (EUA). This EUA will remain  in effect (meaning this test can be used) for the duration of the COVID-19 declaration under Section 56 4(b)(1) of the Act, 21 U.S.C. section 360bbb-3(b)(1),  unless the authorization is terminated or revoked sooner. Performed at [facility], [place] [address], [place], [place] [address]   Aerobic/Anaerobic Culture (surgical/deep wound)     Status: None (Preliminary result)   Collection Time: 10/28/19 12:30 PM   Specimen: Wound  Result Value Ref Range Status   Specimen Description   Final    WOUND LEG RIGHT Performed at [facility], [address] [place], [place] [address]    Special Requests   Final    Normal Performed at [facility], [address] [place], [place] [address]    Gram Stain   Final    NO WBC SEEN MODERATE GRAM POSITIVE  COCCI MODERATE GRAM POSITIVE RODS Performed at [facility], [place] [address] [place], [place] [address]    Culture   Final    FEW STREPTOCOCCUS AGALACTIAE TESTING AGAINST S. AGALACTIAE NOT ROUTINELY PERFORMED DUE TO PREDICTABILITY OF AMP/PEN/VAN SUSCEPTIBILITY. RARE ACINETOBACTER CALCOACETICUS/BAUMANNII COMPLEX    Report Status PENDING  Incomplete   Organism ID, Bacteria ACINETOBACTER CALCOACETICUS/BAUMANNII COMPLEX  Final      Susceptibility   Acinetobacter calcoaceticus/baumannii complex - MIC*    CEFTAZIDIME 4 SENSITIVE Sensitive     CIPROFLOXACIN >=4 RESISTANT Resistant     GENTAMICIN 2 SENSITIVE Sensitive     IMIPENEM <=0.25 SENSITIVE Sensitive     PIP/TAZO <=4 SENSITIVE Sensitive     TRIMETH/SULFA <=20 SENSITIVE Sensitive     AMPICILLIN/SULBACTAM <=2 SENSITIVE Sensitive     * RARE ACINETOBACTER CALCOACETICUS/BAUMANNII COMPLEX  CULTURE, BLOOD (ROUTINE X 2) w Reflex to ID Panel     Status: None (Preliminary result)   Collection Time: 10/29/19  1:44 PM   Specimen: BLOOD  Result Value Ref Range Status   Specimen Description BLOOD [facility]  Final   Special Requests   Final    BOTTLES DRAWN AEROBIC AND ANAEROBIC Blood Culture adequate volume   Culture   Final    NO GROWTH 2 DAYS Performed at [facility], [address] [place], [place] [address]     Report Status PENDING  Incomplete  CULTURE, BLOOD (ROUTINE X 2) w Reflex to ID Panel     Status: None (Preliminary result)   Collection Time: 10/29/19  1:46 PM   Specimen: BLOOD  Result Value Ref Range Status   Specimen Description BLOOD  LAC  Final   Special Requests   Final    BOTTLES DRAWN AEROBIC AND ANAEROBIC Blood Culture adequate volume   Culture   Final    NO GROWTH 2 DAYS Performed at [facility], [address] [place], [place] [address]    Report Status PENDING  Incomplete         Radiology Studies: DG Lumbar Spine 2-3 Views  Result Date: 10/31/2019 CLINICAL DATA:  Left thigh pain EXAM: LUMBAR SPINE - 2-3 VIEW COMPARISON:  None. FINDINGS: Five lumbar type vertebral bodies are well visualized. Vertebral body height is well maintained. Degenerative anterolisthesis of L4 on L5 is noted. Facet hypertrophic changes are seen. Changes of prior left hip replacement are noted. Degenerative changes of the right hip are seen. Aortic calcifications are noted. IMPRESSION: Degenerative change with anterolisthesis of L4 on L5. Electronically Signed   By: Inez Catalina M.D.   On: 10/31/2019 16:41   CT ANKLE RIGHT W CONTRAST  Result Date: 10/31/2019 CLINICAL DATA:  Dehiscence of the surgical wound along the right lateral ankle. Prior plate and screw fixation of the lateral malleolus. EXAM: CT OF THE RIGHT ANKLE WITH CONTRAST TECHNIQUE: Multidetector CT imaging of the right ankle was performed following the standard protocol during bolus administration of intravenous contrast. CONTRAST:  139mL OMNIPAQUE IOHEXOL 300 MG/ML  SOLN COMPARISON:  Radiographs from 10/28/2019 FINDINGS: Bones/Joint/Cartilage Lateral malleolar plate and screw fixator traversing the oblique fracture, with lack of bony bridging and early cortication along the fracture plane favoring early nonunion. Concern was raised on radiography for lucency around the screws but currently no abnormal lucency is observed around  the 5 screws proximal to the fracture or 2 screws distal to the fracture to favor loosening or infection. No bony destructive findings are identified. Small fragments along the anterior margin of the fracture plane  indicate mild prior comminution There is periosteal reaction along the posterior tibial metaphysis along with a corticated ossicle along the posteroinferior tibial rim indicating a likely small posterior malleolar fracture component of the prior injury, favoring at least a stage III Weber B injury. There is a small ossicle below the medial malleolus but it appears well corticated and does not encompass the entire attachment of the deep tibiotalar portion of the deltoid ligament., which grossly appears intact, implying that this was probably not a stage IV Weber B injury. Mild irregular spurring with some underlying lucency in the medial talar dome posteriorly for example on images 80-85 of series 6, favoring degenerative spurring and potentially a small sagittally oriented non-fragmented chronic osteochondral injury. Degenerative arthropathy of the Lisfranc joint medially; only part of the Lisfranc joint is included on today's examination but there is clearly considerable spurring and loss of articular space between the medial cuneiform and base of the first metatarsal, and the middle cuneiform and the base of the second metatarsal. Please note that the Lisfranc joint is not included in its entirety. Mild articular surface irregularity along the posterior tibial plafond for example on image 44/7. Plantar calcaneal spur noted. Ligaments Suboptimally assessed by CT. My sense on image 83/9 is that the deep tibiotalar portion of the deltoid ligament is probably intact, although there is some indistinctness and surrounding edema. The tibionavicular and tibiospring portions of deltoid ligament are obscured by surrounding edema. Small bony fragments just above the expected vicinity of the anterior talofibular  ligament. Muscles and Tendons No flexor tendon entrapment. Expanded contour of the tibialis posterior tendon compatible with tendinopathy. Soft tissues Subcutaneous edema surrounding the ankle and tracking in the dorsum of the foot and the heel. There is some confluent mild cutaneous irregularity overlying the lateral plate and screw fixator for example on image 51/8 at least partially from overlying bandaging, but reportedly this is the region of the cutaneous wound. I do not see any gas tracking in the soft tissues. I do not observe a drainable rim enhancing fluid collection to suggest abscess. IMPRESSION: 1. Lateral malleolar plate and screw fixator traversing the oblique fracture, with lack of bony bridging and early cortication along the fracture plane favoring early nonunion. There is also some periosteal reaction along the posterior tibial metaphysis compatible with a small posterior malleolar fracture component of the prior injury. Appearance compatible with previous Weber B stage III fracture. 2. No current lucency along the screws of the plate and screw fixator to suggest loosening or infection around the screws. 3. Small ossicle below the medial malleolus appears well corticated and does not encompass the entire attachment of the deep tibiotalar portion of the deltoid ligament. 4. Degenerative arthropathy of the Lisfranc joint medially, only part of the Lisfranc joint is included on today's examination. 5. Plantar calcaneal spur. 6. Expanded contour of the tibialis posterior tendon compatible with tendinopathy. Correlate clinically in assessing for tibialis posterior dysfunction. 7. Subcutaneous edema surrounding the ankle and tracking in the dorsum of the foot and heel. No drainable abscess is identified. No gas tracking in the soft tissues. 8. Mild articular surface irregularity along the posterior tibial plafond, query a small sagittally oriented non-fragmented chronic osteochondral injury.  Electronically Signed   By: Van Clines M.D.   On: 10/31/2019 08:23   CT FEMUR LEFT W CONTRAST  Result Date: 10/31/2019 CLINICAL DATA:  Acute onset of left thigh pain. Remote left hip arthroplasty. EXAM: CT OF THE LOWER RIGHT EXTREMITY WITH CONTRAST TECHNIQUE:  Multidetector CT imaging of the lower right extremity was performed according to the standard protocol following intravenous contrast administration. COMPARISON:  Radiographs, same date. CONTRAST:  153mL OMNIPAQUE IOHEXOL 300 MG/ML  SOLN FINDINGS: The left hip prosthesis is intact. No findings suspicious for periprosthetic fracture or loosening. No dislocation. The left femur is intact. A total left knee arthroplasty is noted. No obvious complicating features. Marked enlargement of the left iliopsoas muscle suggesting hematoma, possible spontaneous bleed or tear, if there was trauma. This is likely causing the patient's left hip pain. There is also some subcutaneous edema or hemorrhage in the lateral hip area. No significant intrapelvic abnormalities are identified. The pubic symphysis and pubic bones are intact. Advanced degenerative changes are noted involving the right hip. IMPRESSION: 1. Marked enlargement of the left iliopsoas muscle suggesting hematoma, possible spontaneous bleed or tear, if there was trauma. 2. Intact left hip prosthesis and no findings suspicious for periprosthetic fracture or loosening. 3. No significant intrapelvic abnormalities. 4. Advanced degenerative changes involving the right hip. Electronically Signed   By: Marijo Sanes M.D.   On: 10/31/2019 16:46   DG HIP UNILAT WITH PELVIS 2-3 VIEWS LEFT  Result Date: 10/31/2019 CLINICAL DATA:  Left thigh pain EXAM: DG HIP (WITH OR WITHOUT PELVIS) 2-3V LEFT COMPARISON:  08/07/2019 FINDINGS: Pelvic ring is intact. Changes of prior left hip replacement are noted. Significant degenerative changes in the right hip are seen. No soft tissue abnormality is seen. IMPRESSION: Chronic  changes without acute abnormality. Electronically Signed   By: Inez Catalina M.D.   On: 10/31/2019 16:42        Scheduled Meds: . atorvastatin  40 mg Oral QPM  . brimonidine  1 drop Both Eyes TID   And  . brinzolamide  1 drop Both Eyes TID  . calcium-vitamin D  1 tablet Oral BID  . fluticasone  2 spray Each Nare Daily  . imipramine  25 mg Oral QHS  . magnesium oxide  400 mg Oral Daily  . metoprolol succinate  100 mg Oral QHS  . midodrine  5 mg Oral TID PC  . mirabegron ER  25 mg Oral Daily   Continuous Infusions: . sodium chloride Stopped (11/01/19 0304)  . ampicillin-sulbactam (UNASYN) IV 3 g (11/01/19 0530)     LOS: 5 days    Time spent: 45 minutes    Ezekiel Slocumb, DO [facility]   If 7PM-7AM, please contact night-coverage [email or web address] 11/01/2019, 7:31 AM

## 2019-11-01 NOTE — Progress Notes (Signed)
Date of Admission:  10/27/2019     ID: Becky Trevino is a 74 y.o. female  Principal Problem:   Bacteremia due to group B Streptococcus Active Problems:   Chronic kidney disease (CKD), stage III (moderate)   Type 2 diabetes mellitus with diabetic neuropathy, without long-term current use of insulin (HCC)   Cellulitis of right lower extremity   Acute deep vein thrombosis (DVT) of right lower extremity (HCC)    Subjective: Feeling better Left thigh pain better Some numbness persist  Medications:  . brimonidine  1 drop Both Eyes TID   And  . brinzolamide  1 drop Both Eyes TID  . calcium-vitamin D  1 tablet Oral BID  . fluticasone  2 spray Each Nare Daily  . imipramine  25 mg Oral QHS  . magnesium oxide  400 mg Oral Daily  . metoprolol succinate  100 mg Oral QHS  . midodrine  5 mg Oral TID PC  . mirabegron ER  25 mg Oral Daily    Objective: Vital signs in last 24 hours: Temp:  [97.8 F (36.6 C)-98 F (36.7 C)] 97.8 F (36.6 C) (02/16 1146) Pulse Rate:  [69-77] 71 (02/16 1146) Resp:  [16-18] 18 (02/16 1146) BP: (106-158)/(56-92) 106/64 (02/16 1146) SpO2:  [91 %-100 %] 91 % (02/16 1146)  PHYSICAL EXAM:  General: Alert, cooperative, no distress, appears stated age.  Head: Normocephalic, without obvious abnormality, atraumatic. Eyes: Conjunctivae clear, anicteric sclerae. Pupils are equal ENT Nares normal. No drainage or sinus tenderness. Lips, mucosa, and tongue normal. No Thrush Neck: Supple, symmetrical, no adenopathy, thyroid: non tender no carotid bruit and no JVD. Back: No CVA tenderness. Lungs: Clear to auscultation bilaterally. No Wheezing or Rhonchi. No rales. Heart: Regular rate and rhythm, no murmur, rub or gallop. Abdomen: Soft, non-tender,not distended. Bowel sounds normal. No masses Extremities: rt leg- surgical site has small dehiscence Left thigh no erythema Skin: No rashes or lesions. Or bruising Lymph: Cervical, supraclavicular  normal. Neurologic: Grossly non-focal  Lab Results Recent Labs    10/31/19 0526 11/01/19 0159  WBC 6.9 9.9  HGB 11.2* 10.9*  HCT 34.9* 34.3*  NA 138 138  K 3.5 3.8  CL 105 103  CO2 24 27  BUN 11 9  CREATININE 0.93 0.89   Liver Panel No results for input(s): PROT, ALBUMIN, AST, ALT, ALKPHOS, BILITOT, BILIDIR, IBILI in the last 72 hours. Sedimentation Rate No results for input(s): ESRSEDRATE in the last 72 hours. C-Reactive Protein No results for input(s): CRP in the last 72 hours.  Microbiology:  Studies/Results: DG Lumbar Spine 2-3 Views  Result Date: 10/31/2019 CLINICAL DATA:  Left thigh pain EXAM: LUMBAR SPINE - 2-3 VIEW COMPARISON:  None. FINDINGS: Five lumbar type vertebral bodies are well visualized. Vertebral body height is well maintained. Degenerative anterolisthesis of L4 on L5 is noted. Facet hypertrophic changes are seen. Changes of prior left hip replacement are noted. Degenerative changes of the right hip are seen. Aortic calcifications are noted. IMPRESSION: Degenerative change with anterolisthesis of L4 on L5. Electronically Signed   By: Inez Catalina M.D.   On: 10/31/2019 16:41   CT ANKLE RIGHT W CONTRAST  Result Date: 10/31/2019 CLINICAL DATA:  Dehiscence of the surgical wound along the right lateral ankle. Prior plate and screw fixation of the lateral malleolus. EXAM: CT OF THE RIGHT ANKLE WITH CONTRAST TECHNIQUE: Multidetector CT imaging of the right ankle was performed following the standard protocol during bolus administration of intravenous contrast. CONTRAST:  137mL OMNIPAQUE  IOHEXOL 300 MG/ML  SOLN COMPARISON:  Radiographs from 10/28/2019 FINDINGS: Bones/Joint/Cartilage Lateral malleolar plate and screw fixator traversing the oblique fracture, with lack of bony bridging and early cortication along the fracture plane favoring early nonunion. Concern was raised on radiography for lucency around the screws but currently no abnormal lucency is observed around the  5 screws proximal to the fracture or 2 screws distal to the fracture to favor loosening or infection. No bony destructive findings are identified. Small fragments along the anterior margin of the fracture plane indicate mild prior comminution There is periosteal reaction along the posterior tibial metaphysis along with a corticated ossicle along the posteroinferior tibial rim indicating a likely small posterior malleolar fracture component of the prior injury, favoring at least a stage III Weber B injury. There is a small ossicle below the medial malleolus but it appears well corticated and does not encompass the entire attachment of the deep tibiotalar portion of the deltoid ligament., which grossly appears intact, implying that this was probably not a stage IV Weber B injury. Mild irregular spurring with some underlying lucency in the medial talar dome posteriorly for example on images 80-85 of series 6, favoring degenerative spurring and potentially a small sagittally oriented non-fragmented chronic osteochondral injury. Degenerative arthropathy of the Lisfranc joint medially; only part of the Lisfranc joint is included on today's examination but there is clearly considerable spurring and loss of articular space between the medial cuneiform and base of the first metatarsal, and the middle cuneiform and the base of the second metatarsal. Please note that the Lisfranc joint is not included in its entirety. Mild articular surface irregularity along the posterior tibial plafond for example on image 44/7. Plantar calcaneal spur noted. Ligaments Suboptimally assessed by CT. My sense on image 83/9 is that the deep tibiotalar portion of the deltoid ligament is probably intact, although there is some indistinctness and surrounding edema. The tibionavicular and tibiospring portions of deltoid ligament are obscured by surrounding edema. Small bony fragments just above the expected vicinity of the anterior talofibular  ligament. Muscles and Tendons No flexor tendon entrapment. Expanded contour of the tibialis posterior tendon compatible with tendinopathy. Soft tissues Subcutaneous edema surrounding the ankle and tracking in the dorsum of the foot and the heel. There is some confluent mild cutaneous irregularity overlying the lateral plate and screw fixator for example on image 51/8 at least partially from overlying bandaging, but reportedly this is the region of the cutaneous wound. I do not see any gas tracking in the soft tissues. I do not observe a drainable rim enhancing fluid collection to suggest abscess. IMPRESSION: 1. Lateral malleolar plate and screw fixator traversing the oblique fracture, with lack of bony bridging and early cortication along the fracture plane favoring early nonunion. There is also some periosteal reaction along the posterior tibial metaphysis compatible with a small posterior malleolar fracture component of the prior injury. Appearance compatible with previous Weber B stage III fracture. 2. No current lucency along the screws of the plate and screw fixator to suggest loosening or infection around the screws. 3. Small ossicle below the medial malleolus appears well corticated and does not encompass the entire attachment of the deep tibiotalar portion of the deltoid ligament. 4. Degenerative arthropathy of the Lisfranc joint medially, only part of the Lisfranc joint is included on today's examination. 5. Plantar calcaneal spur. 6. Expanded contour of the tibialis posterior tendon compatible with tendinopathy. Correlate clinically in assessing for tibialis posterior dysfunction. 7. Subcutaneous edema surrounding the  ankle and tracking in the dorsum of the foot and heel. No drainable abscess is identified. No gas tracking in the soft tissues. 8. Mild articular surface irregularity along the posterior tibial plafond, query a small sagittally oriented non-fragmented chronic osteochondral injury.  Electronically Signed   By: Van Clines M.D.   On: 10/31/2019 08:23   CT FEMUR LEFT W CONTRAST  Result Date: 10/31/2019 CLINICAL DATA:  Acute onset of left thigh pain. Remote left hip arthroplasty. EXAM: CT OF THE LOWER RIGHT EXTREMITY WITH CONTRAST TECHNIQUE: Multidetector CT imaging of the lower right extremity was performed according to the standard protocol following intravenous contrast administration. COMPARISON:  Radiographs, same date. CONTRAST:  176mL OMNIPAQUE IOHEXOL 300 MG/ML  SOLN FINDINGS: The left hip prosthesis is intact. No findings suspicious for periprosthetic fracture or loosening. No dislocation. The left femur is intact. A total left knee arthroplasty is noted. No obvious complicating features. Marked enlargement of the left iliopsoas muscle suggesting hematoma, possible spontaneous bleed or tear, if there was trauma. This is likely causing the patient's left hip pain. There is also some subcutaneous edema or hemorrhage in the lateral hip area. No significant intrapelvic abnormalities are identified. The pubic symphysis and pubic bones are intact. Advanced degenerative changes are noted involving the right hip. IMPRESSION: 1. Marked enlargement of the left iliopsoas muscle suggesting hematoma, possible spontaneous bleed or tear, if there was trauma. 2. Intact left hip prosthesis and no findings suspicious for periprosthetic fracture or loosening. 3. No significant intrapelvic abnormalities. 4. Advanced degenerative changes involving the right hip. Electronically Signed   By: Marijo Sanes M.D.   On: 10/31/2019 16:46   DG HIP UNILAT WITH PELVIS 2-3 VIEWS LEFT  Result Date: 10/31/2019 CLINICAL DATA:  Left thigh pain EXAM: DG HIP (WITH OR WITHOUT PELVIS) 2-3V LEFT COMPARISON:  08/07/2019 FINDINGS: Pelvic ring is intact. Changes of prior left hip replacement are noted. Significant degenerative changes in the right hip are seen. No soft tissue abnormality is seen. IMPRESSION: Chronic  changes without acute abnormality. Electronically Signed   By: Inez Catalina M.D.   On: 10/31/2019 16:42     Assessment/Plan:  Left iliopsoas hematoma- spontaneous bleeding- likely due to anticoagulation- conservative management Lovenox stopped   Rt DVT- plan for IC filter  Group B streptococcus bacteremia- source rt leg surgical wound- on unasyn  Non healing dehiscence of prior ORIF site- near rt ankle GBS and acinetobacter in the wound. 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 dont see any cultures done then  DM on insulin  CKD-but cr now is < 1   Discussed the management with patient and Dr.Baker

## 2019-11-01 NOTE — Consult Note (Signed)
[facility] Vascular Consult Note  MRN : [id number]  Becky Trevino is a 74 y.o. (April 25, 1946) female who presents with chief complaint of  Chief Complaint  Patient presents with  . Altered Mental Status   History of Present Illness:  The patient is a 74 year old female with multiple medical issues (see below) who presented to the [facility] Emergency Department with a chief complaint of altered mental status.  The patient's family members noticed the patient had become increasingly confused and called EMS.  The patient notes that she is typically alert and oriented and has no difficulty caring for herself.  When the status change her family decided to call EMS who brought her to the emergency department for further care.  The patient endorses a history of superficial episode and subsequent ankle fracture requiring a right ORIF (09/02/19).  Patient also had a left gluteal hematoma which was drained.  The patient has been recently diagnosed with a right popliteal DVT and placed on Xarelto.  Patient has been followed by podiatry who noted her ankle wound has not been healing.  During her work-up in the emergency department she was noted to have cellulitis to the right lower extremity and leukocytosis.  Patient was seen by podiatry who notes slow healing to the patient's right ankle wound.  Patient continues to have right lower extremity pain, swelling and erythema.  Patient with continued left hip pain.  Denies fever, nausea vomiting.  Denies chest pain or shortness of breath.  Vascular surgery was consulted by Dr. Luana Shu for possible endovascular intervention due to the patient's slow healing right ankle wound.  Vascular surgery was also consulted by Dr. Laurann Montana for possible IVC filter placement in the setting of possible spontaneous left hip hematoma, right popliteal DVT and the need to stop oral anticoagulation.  Current Facility-Administered  Medications  Medication Dose Route Frequency Provider Last Rate Last Admin  . 0.9 %  sodium chloride infusion   Intravenous Continuous Mansy, Arvella Merles, MD   Stopped at 11/01/19 0304  . acetaminophen (TYLENOL) tablet 650 mg  650 mg Oral Q6H PRN Mansy, Jan A, MD   650 mg at 10/31/19 1027   Or  . acetaminophen (TYLENOL) suppository 650 mg  650 mg Rectal Q6H PRN Mansy, Jan A, MD      . Ampicillin-Sulbactam (UNASYN) 3 g in sodium chloride 0.9 % 100 mL IVPB  3 g Intravenous Q6H Dallie Piles, RPH 200 mL/hr at 11/01/19 1132 3 g at 11/01/19 1132  . brimonidine (ALPHAGAN) 0.2 % ophthalmic solution 1 drop  1 drop Both Eyes TID Nicole Kindred A, DO   1 drop at 11/01/19 0931   And  . brinzolamide (AZOPT) 1 % ophthalmic suspension 1 drop  1 drop Both Eyes TID Nicole Kindred A, DO   1 drop at 11/01/19 0931  . calcium-vitamin D (OSCAL WITH D) 500-200 MG-UNIT per tablet 1 tablet  1 tablet Oral BID Mansy, Arvella Merles, MD   1 tablet at 11/01/19 0924  . fluticasone (FLONASE) 50 MCG/ACT nasal spray 2 spray  2 spray Each Nare Daily Mansy, Jan A, MD   2 spray at 11/01/19 0924  . HYDROcodone-acetaminophen (NORCO/VICODIN) 5-325 MG per tablet 1 tablet  1 tablet Oral Q6H PRN Nicole Kindred A, DO   1 tablet at 11/01/19 1132  . imipramine (TOFRANIL) tablet 25 mg  25 mg Oral QHS Nicole Kindred A, DO      . lip balm (BLISTEX) ointment 1  application  1 application Topical PRN Ezekiel Slocumb, DO      . magnesium hydroxide (MILK OF MAGNESIA) suspension 30 mL  30 mL Oral Daily PRN Mansy, Jan A, MD      . magnesium oxide (MAG-OX) tablet 400 mg  400 mg Oral Daily Mansy, Jan A, MD   400 mg at 11/01/19 [id number]  . metoprolol succinate (TOPROL-XL) 24 hr tablet 100 mg  100 mg Oral QHS Mansy, Jan A, MD   100 mg at 10/31/19 2246  . midodrine (PROAMATINE) tablet 5 mg  5 mg Oral TID PC Mansy, Jan A, MD   5 mg at 11/01/19 1313  . mirabegron ER (MYRBETRIQ) tablet 25 mg  25 mg Oral Daily Mansy, Jan A, MD   25 mg at 11/01/19 0925  . morphine 4  MG/ML injection 4 mg  4 mg Intravenous Q3H PRN Nicole Kindred A, DO   4 mg at 11/01/19 0926  . ondansetron (ZOFRAN) tablet 4 mg  4 mg Oral Q6H PRN Mansy, Jan A, MD       Or  . ondansetron [facility]) injection 4 mg  4 mg Intravenous Q6H PRN Mansy, Jan A, MD      . oxyCODONE (Oxy IR/ROXICODONE) immediate release tablet 10 mg  10 mg Oral Q4H PRN Nicole Kindred A, DO   10 mg at 10/31/19 1906  . traZODone (DESYREL) tablet 25 mg  25 mg Oral QHS PRN Mansy, Arvella Merles, MD       Past Medical History:  Diagnosis Date  . Cervical spondylosis   . Chronic kidney disease    Stage 3  . DDD (degenerative disc disease), cervical    [facility]  . Diabetes mellitus without complication ([place])   . Diverticulosis   . Hyperlipidemia   . Hypertension   . Intertrigo   . Leg cramps   . Leg varices   . Obesity   . OSA (obstructive sleep apnea)   . Symptomatic menopausal or female climacteric states   . Syncope    Past Surgical History:  Procedure Laterality Date  . ABDOMINAL HYSTERECTOMY  1993   Total  . BUNIONECTOMY Bilateral 1993  . I & D KNEE WITH POLY EXCHANGE Right 11/19/2017   Procedure: RIGHT KNEE POLY EXCHANGE WITH IRRIGATION AND DEBRIDEMENT;  Surgeon: Hessie Knows, MD;  Location: [facility] ORS;  Service: Orthopedics;  Laterality: Right;  . JOINT REPLACEMENT     bilateral knee  . LAMINECTOMY  11/14/2013    Cervical Fusion , [facility], Dr. Delilah Shan  . LASER ABLATION Bilateral 07/29/2012   Dr. Lucky Cowboy  . ORIF ANKLE FRACTURE Right 09/02/2019   Procedure: OPEN REDUCTION INTERNAL FIXATION (ORIF) ANKLE FRACTURE BIMALLEOLAR;  Surgeon: [name], DPM;  Location: [facility] ORS;  Service: Podiatry;  Laterality: Right;  . REPLACEMENT TOTAL KNEE Left 03/2010   Dr. Rudene Christians  . TOTAL HIP ARTHROPLASTY Left 12/11/2015   Procedure: TOTAL HIP ARTHROPLASTY ANTERIOR APPROACH;  Surgeon: Hessie Knows, MD;  Location: [facility] ORS;  Service: Orthopedics;  Laterality: Left;  . TOTAL KNEE ARTHROPLASTY Right 03/11/2016   Procedure: TOTAL KNEE  ARTHROPLASTY;  Surgeon: Hessie Knows, MD;  Location: [facility] ORS;  Service: Orthopedics;  Laterality: Right;   Social History Social History   Tobacco Use  . Smoking status: Never Smoker  . Smokeless tobacco: Never Used  Substance Use Topics  . Alcohol use: No    Alcohol/week: 0.0 standard drinks  . Drug use: No   Family History Family History  Problem Relation Age of Onset  .  Diabetes Mother   . Hyperlipidemia Mother   . Hypertension Mother   . Diabetes Father   . Hyperlipidemia Father   . Hypertension Father   . Obesity Father   . Hypertension Sister   . Hyperlipidemia Sister   . Hyperlipidemia Sister   . Breast cancer Neg Hx   Denies family history of peripheral artery disease, renal disease or venous disease.  Allergies  Allergen Reactions  . Lisinopril Swelling   REVIEW OF SYSTEMS (Negative unless checked)  Constitutional: [] Weight loss  [] Fever  [] Chills Cardiac: [] Chest pain   [] Chest pressure   [] Palpitations   [] Shortness of breath when laying flat   [] Shortness of breath at rest   [] Shortness of breath with exertion. Vascular:  [x] Pain in legs with walking   [x] Pain in legs at rest   [x] Pain in legs when laying flat   [] Claudication   [] Pain in feet when walking  [] Pain in feet at rest  [] Pain in feet when laying flat   [x] History of DVT   [x] Phlebitis   [] Swelling in legs   [] Varicose veins   [] Non-healing ulcers Pulmonary:   [] Uses home oxygen   [] Productive cough   [] Hemoptysis   [] Wheeze  [] COPD   [] Asthma Neurologic:  [] Dizziness  [] Blackouts   [] Seizures   [] History of stroke   [] History of TIA  [] Aphasia   [] Temporary blindness   [] Dysphagia   [] Weakness or numbness in arms   [] Weakness or numbness in legs Musculoskeletal:  [] Arthritis   [] Joint swelling   [] Joint pain   [] Low back pain Hematologic:  [] Easy bruising  [] Easy bleeding   [] Hypercoagulable state   [] Anemic  [] Hepatitis Gastrointestinal:  [] Blood in stool   [] Vomiting blood  [] Gastroesophageal  reflux/heartburn   [] Difficulty swallowing. Genitourinary:  [] Chronic kidney disease   [] Difficult urination  [] Frequent urination  [] Burning with urination   [] Blood in urine Skin:  [] Rashes   [] Ulcers   [] Wounds Psychological:  [] History of anxiety   []  History of major depression.  Physical Examination  Vitals:   10/31/19 1932 11/01/19 0439 11/01/19 0757 11/01/19 1146  BP: (!) 158/92 (!) 111/56 129/75 106/64  Pulse: 69 77 71 71  Resp: 18 16 18 18   Temp: 97.8 F (36.6 C) 98 F (36.7 C) 97.8 F (36.6 C) 97.8 F (36.6 C)  TempSrc: Oral Oral Oral Oral  SpO2: 96% 100% 95% 91%  Weight:      Height:       Body mass index is 35.73 kg/m. Gen:  WD/WN, NAD Head: [place]/AT, No temporalis wasting. Prominent temp pulse not noted. Ear/Nose/Throat: Hearing grossly intact, nares w/o erythema or drainage, oropharynx w/o Erythema/Exudate Eyes: Sclera non-icteric, conjunctiva clear Neck: Trachea midline.  No JVD.  Pulmonary:  Good air movement, respirations not labored, equal bilaterally.  Cardiac: RRR, normal S1, S2. Vascular:  Vessel Right Left  Radial Palpable Palpable  Ulnar Palpable Palpable  Brachial Palpable Palpable  Carotid Palpable, without bruit Palpable, without bruit  Aorta Not palpable N/A  Femoral Palpable Palpable  Popliteal Palpable Palpable  PT Non-Palpable Non-Palpable  DP Non-Palpable Non-Palpable   Right Lower Extremity: Thigh soft.  Calf soft.  Moderate edema.  Extremities warm distally.  In orthopedic brace.  Left Lower Extremity: Thigh soft.  Calf soft. Mild edema.   Gastrointestinal: soft, non-tender/non-distended. No guarding/reflex.  Musculoskeletal: M/S 5/5 throughout.  Extremities without ischemic changes.  No deformity or atrophy.  Neurologic: Sensation grossly intact in extremities.  Symmetrical.  Speech is fluent. Motor exam as listed  above. Psychiatric: Judgment intact, Mood & affect appropriate for pt's clinical situation. Dermatologic: Ankle  wound Lymph : No Cervical, Axillary, or Inguinal lymphadenopathy.  CBC Lab Results  Component Value Date   WBC 9.9 11/01/2019   HGB 10.9 (L) 11/01/2019   HCT 34.3 (L) 11/01/2019   MCV 92.0 11/01/2019   PLT 158 11/01/2019   BMET    Component Value Date/Time   NA 138 11/01/2019 0159   NA 142 11/26/2015 1122   NA 141 10/18/2012 1716   K 3.8 11/01/2019 0159   K 3.5 10/18/2012 1716   CL 103 11/01/2019 0159   CL 110 (H) 10/18/2012 1716   CO2 27 11/01/2019 0159   CO2 24 10/18/2012 1716   GLUCOSE 107 (H) 11/01/2019 0159   GLUCOSE 128 (H) 10/18/2012 1716   BUN 9 11/01/2019 0159   BUN 27 11/26/2015 1122   BUN 31 (H) 10/18/2012 1716   CREATININE 0.89 11/01/2019 0159   CREATININE 1.14 (H) 08/23/2019 0000   CALCIUM 9.0 11/01/2019 0159   CALCIUM 7.9 (L) 10/18/2012 1716   GFRNONAA >60 11/01/2019 0159   GFRNONAA 48 (L) 08/23/2019 0000   GFRAA >60 11/01/2019 0159   GFRAA 55 (L) 08/23/2019 0000   Estimated Creatinine Clearance: 77.9 mL/min (by C-G formula based on SCr of 0.89 mg/dL).  COAG Lab Results  Component Value Date   INR 1.2 10/28/2019   INR 1.13 11/17/2017   INR 1.12 02/26/2016   Radiology DG Lumbar Spine 2-3 Views  Result Date: 10/31/2019 CLINICAL DATA:  Left thigh pain EXAM: LUMBAR SPINE - 2-3 VIEW COMPARISON:  None. FINDINGS: Five lumbar type vertebral bodies are well visualized. Vertebral body height is well maintained. Degenerative anterolisthesis of L4 on L5 is noted. Facet hypertrophic changes are seen. Changes of prior left hip replacement are noted. Degenerative changes of the right hip are seen. Aortic calcifications are noted. IMPRESSION: Degenerative change with anterolisthesis of L4 on L5. Electronically Signed   By: Inez Catalina M.D.   On: 10/31/2019 16:41   DG Ankle 2 Views Right  Result Date: 10/28/2019 CLINICAL DATA:  The patient suffered a distal fibular fracture due to a fall in December, 2020. The patient underwent ORIF 09/02/2019 and now has developed  swelling, warmth and tenderness about the surgical site. EXAM: RIGHT ANKLE - 2 VIEW COMPARISON:  Intraoperative imaging for fracture fixation 09/02/2019. FINDINGS: Plate and screw fixation of a distal fibular fracture is identified. Position and alignment are anatomic. Subtle lucency about fixation screws is most notable about the 3rd-6th screws from the top and worrisome for infection or loosening. Soft tissues about the ankle are swollen. No soft tissue gas is identified. Vac drain is in place. IMPRESSION: Status post fixation of a distal fibular fracture. There is subtle lucency about multiple fixation screws worrisome for loosening or infection. Soft tissue swelling about the ankle without soft tissue gas. Electronically Signed   By: Inge Rise M.D.   On: 10/28/2019 09:29   CT Head Wo Contrast  Result Date: 10/27/2019 CLINICAL DATA:  Encephalopathy EXAM: CT HEAD WITHOUT CONTRAST TECHNIQUE: Contiguous axial images were obtained from the base of the skull through the vertex without intravenous contrast. COMPARISON:  Head CT 08/07/2019 FINDINGS: Brain: There is no mass, hemorrhage or extra-axial collection. The size and configuration of the ventricles and extra-axial CSF spaces are normal. The brain parenchyma is normal, without acute or chronic infarction. Vascular: No abnormal hyperdensity of the major intracranial arteries or dural venous sinuses. No intracranial atherosclerosis. Skull: Craniocervical  fusion. Sinuses/Orbits: No fluid levels or advanced mucosal thickening of the visualized paranasal sinuses. No mastoid or middle ear effusion. The orbits are normal. IMPRESSION: Normal brain. Electronically Signed   By: Ulyses Jarred M.D.   On: 10/27/2019 22:18   CT ANKLE RIGHT W CONTRAST  Result Date: 10/31/2019 CLINICAL DATA:  Dehiscence of the surgical wound along the right lateral ankle. Prior plate and screw fixation of the lateral malleolus. EXAM: CT OF THE RIGHT ANKLE WITH CONTRAST TECHNIQUE:  Multidetector CT imaging of the right ankle was performed following the standard protocol during bolus administration of intravenous contrast. CONTRAST:  137mL OMNIPAQUE IOHEXOL 300 MG/ML  SOLN COMPARISON:  Radiographs from 10/28/2019 FINDINGS: Bones/Joint/Cartilage Lateral malleolar plate and screw fixator traversing the oblique fracture, with lack of bony bridging and early cortication along the fracture plane favoring early nonunion. Concern was raised on radiography for lucency around the screws but currently no abnormal lucency is observed around the 5 screws proximal to the fracture or 2 screws distal to the fracture to favor loosening or infection. No bony destructive findings are identified. Small fragments along the anterior margin of the fracture plane indicate mild prior comminution There is periosteal reaction along the posterior tibial metaphysis along with a corticated ossicle along the posteroinferior tibial rim indicating a likely small posterior malleolar fracture component of the prior injury, favoring at least a stage III Weber B injury. There is a small ossicle below the medial malleolus but it appears well corticated and does not encompass the entire attachment of the deep tibiotalar portion of the deltoid ligament., which grossly appears intact, implying that this was probably not a stage IV Weber B injury. Mild irregular spurring with some underlying lucency in the medial talar dome posteriorly for example on images 80-85 of series 6, favoring degenerative spurring and potentially a small sagittally oriented non-fragmented chronic osteochondral injury. Degenerative arthropathy of the Lisfranc joint medially; only part of the Lisfranc joint is included on today's examination but there is clearly considerable spurring and loss of articular space between the medial cuneiform and base of the first metatarsal, and the middle cuneiform and the base of the second metatarsal. Please note that the  Lisfranc joint is not included in its entirety. Mild articular surface irregularity along the posterior tibial plafond for example on image 44/7. Plantar calcaneal spur noted. Ligaments Suboptimally assessed by CT. My sense on image 83/9 is that the deep tibiotalar portion of the deltoid ligament is probably intact, although there is some indistinctness and surrounding edema. The tibionavicular and tibiospring portions of deltoid ligament are obscured by surrounding edema. Small bony fragments just above the expected vicinity of the anterior talofibular ligament. Muscles and Tendons No flexor tendon entrapment. Expanded contour of the tibialis posterior tendon compatible with tendinopathy. Soft tissues Subcutaneous edema surrounding the ankle and tracking in the dorsum of the foot and the heel. There is some confluent mild cutaneous irregularity overlying the lateral plate and screw fixator for example on image 51/8 at least partially from overlying bandaging, but reportedly this is the region of the cutaneous wound. I do not see any gas tracking in the soft tissues. I do not observe a drainable rim enhancing fluid collection to suggest abscess. IMPRESSION: 1. Lateral malleolar plate and screw fixator traversing the oblique fracture, with lack of bony bridging and early cortication along the fracture plane favoring early nonunion. There is also some periosteal reaction along the posterior tibial metaphysis compatible with a small posterior malleolar fracture component of  the prior injury. Appearance compatible with previous Weber B stage III fracture. 2. No current lucency along the screws of the plate and screw fixator to suggest loosening or infection around the screws. 3. Small ossicle below the medial malleolus appears well corticated and does not encompass the entire attachment of the deep tibiotalar portion of the deltoid ligament. 4. Degenerative arthropathy of the Lisfranc joint medially, only part of the  Lisfranc joint is included on today's examination. 5. Plantar calcaneal spur. 6. Expanded contour of the tibialis posterior tendon compatible with tendinopathy. Correlate clinically in assessing for tibialis posterior dysfunction. 7. Subcutaneous edema surrounding the ankle and tracking in the dorsum of the foot and heel. No drainable abscess is identified. No gas tracking in the soft tissues. 8. Mild articular surface irregularity along the posterior tibial plafond, query a small sagittally oriented non-fragmented chronic osteochondral injury. Electronically Signed   By: Van Clines M.D.   On: 10/31/2019 08:23   CT FEMUR LEFT W CONTRAST  Result Date: 10/31/2019 CLINICAL DATA:  Acute onset of left thigh pain. Remote left hip arthroplasty. EXAM: CT OF THE LOWER RIGHT EXTREMITY WITH CONTRAST TECHNIQUE: Multidetector CT imaging of the lower right extremity was performed according to the standard protocol following intravenous contrast administration. COMPARISON:  Radiographs, same date. CONTRAST:  19mL OMNIPAQUE IOHEXOL 300 MG/ML  SOLN FINDINGS: The left hip prosthesis is intact. No findings suspicious for periprosthetic fracture or loosening. No dislocation. The left femur is intact. A total left knee arthroplasty is noted. No obvious complicating features. Marked enlargement of the left iliopsoas muscle suggesting hematoma, possible spontaneous bleed or tear, if there was trauma. This is likely causing the patient's left hip pain. There is also some subcutaneous edema or hemorrhage in the lateral hip area. No significant intrapelvic abnormalities are identified. The pubic symphysis and pubic bones are intact. Advanced degenerative changes are noted involving the right hip. IMPRESSION: 1. Marked enlargement of the left iliopsoas muscle suggesting hematoma, possible spontaneous bleed or tear, if there was trauma. 2. Intact left hip prosthesis and no findings suspicious for periprosthetic fracture or  loosening. 3. No significant intrapelvic abnormalities. 4. Advanced degenerative changes involving the right hip. Electronically Signed   By: Marijo Sanes M.D.   On: 10/31/2019 16:46   US Venous Img Lower Unilateral Right  Result Date: 10/27/2019 CLINICAL DATA:  Erythema and edema EXAM: Right LOWER EXTREMITY VENOUS DOPPLER ULTRASOUND TECHNIQUE: Gray-scale sonography with graded compression, as well as color Doppler and duplex ultrasound were performed to evaluate the lower extremity deep venous systems from the level of the common femoral vein and including the common femoral, femoral, profunda femoral, popliteal and calf veins including the posterior tibial, peroneal and gastrocnemius veins when visible. The superficial great saphenous vein was also interrogated. Spectral Doppler was utilized to evaluate flow at rest and with distal augmentation maneuvers in the common femoral, femoral and popliteal veins. COMPARISON:  None FINDINGS: Contralateral Common Femoral Vein: Respiratory phasicity is normal and symmetric with the symptomatic side. No evidence of thrombus. Normal compressibility. Common Femoral Vein: No evidence of thrombus. Normal compressibility, respiratory phasicity and response to augmentation. Saphenofemoral Junction: There is occlusive thrombus at the junction of the saphenous vein with the common femoral vein. Profunda Femoral Vein: No evidence of thrombus. Normal compressibility and flow on color Doppler imaging. Femoral Vein: No evidence of thrombus. Normal compressibility, respiratory phasicity and response to augmentation. Popliteal Vein: There is occlusive thrombus within the right popliteal vein. Calf Veins: No evidence of thrombus.  Normal compressibility and flow on color Doppler imaging. Superficial Great Saphenous Vein: No evidence of thrombus. Normal compressibility. Venous Reflux:  None. Other Findings:  None. IMPRESSION: 1. Deep venous thrombus within the right popliteal vein, with  superficial thrombus seen at the saphenofemoral junction. These results will be called to the ordering clinician or representative by the Radiologist Assistant, and communication documented in the PACS or zVision Dashboard. Electronically Signed   By: Randa Ngo M.D.   On: 10/27/2019 23:04   DG Chest Port 1 View  Result Date: 10/27/2019 CLINICAL DATA:  Altered level of consciousness, sepsis EXAM: PORTABLE CHEST 1 VIEW COMPARISON:  08/07/2019 FINDINGS: Single frontal view of the chest was performed. Lung volumes are diminished, with crowding of the pulmonary vasculature. No definite airspace disease, effusion, or pneumothorax. The cardiac silhouette is stable. IMPRESSION: 1. Low lung volumes with crowding of the pulmonary vasculature. 2. No acute airspace disease. Electronically Signed   By: Randa Ngo M.D.   On: 10/27/2019 22:40   DG HIP UNILAT WITH PELVIS 2-3 VIEWS LEFT  Result Date: 10/31/2019 CLINICAL DATA:  Left thigh pain EXAM: DG HIP (WITH OR WITHOUT PELVIS) 2-3V LEFT COMPARISON:  08/07/2019 FINDINGS: Pelvic ring is intact. Changes of prior left hip replacement are noted. Significant degenerative changes in the right hip are seen. No soft tissue abnormality is seen. IMPRESSION: Chronic changes without acute abnormality. Electronically Signed   By: Inez Catalina M.D.   On: 10/31/2019 16:42   Assessment/Plan The patient is a 74 year old female with multiple medical issues (see below) who presented to the [facility] Emergency Department with a chief complaint of altered mental status.  1. Chronic Wound Right Ankle: Patient with slow healing wound to the right ankle.  Now presents with cellulitis.  Hard to palpate pedal pulses on exam.  Recommend a right lower extremity angiogram with possible intervention to assess the patient's anatomy and contributing degree of peripheral artery disease.  If appropriate an attempt to revascularize the leg made at that time in hopes of  improving wound healing.  Procedure, risks and benefits explained to the patient.  All questions answered.  Patient was to proceed.  We will plan on this either Wednesday or Thursday of this week.  2.  Right lower extremity DVT: Patient with postop DVT to the right lower extremity.  Was on Xarelto.  Transition to Lovenox as inpatient.  Now with possible increasing left hip hematoma.  Patient will have to stop anticoagulation.  Recommend IVC filter placement to protect against pulmonary embolus.  Procedure, risks and benefits plan to the patient.  All questions answered.  Patient wishes to proceed.  We will plan on this today.  3.  Diabetes: On appropriate medications. Encouraged good control as its slows the progression of atherosclerotic disease and impede wound healing.  Discussed with Dr. Francene Castle, PA-C  11/01/2019 1:26 PM  This note was created with Dragon medical transcription system.  Any error is purely unintentional.

## 2019-11-01 NOTE — Progress Notes (Signed)
PODIATRY / FOOT AND ANKLE SURGERY PROGRESS NOTE  Reason for consult: Right lateral ankle wound  Chief Complaint: R ankle wound, leg pain   HPI: Becky Trevino is a 74 y.o. female who presents resting in bed comfortably with some continued left hip pain.  Patient states that she has noticed more pain since her admission overall to the L hip.  Orthopedics team has been consulted to address this issue potentially.  Patient denies n/v/f/c.  PMHx:  Past Medical History:  Diagnosis Date  . Cervical spondylosis   . Chronic kidney disease    Stage 3  . DDD (degenerative disc disease), cervical    [facility]  . Diabetes mellitus without complication ([place])   . Diverticulosis   . Hyperlipidemia   . Hypertension   . Intertrigo   . Leg cramps   . Leg varices   . Obesity   . OSA (obstructive sleep apnea)   . Symptomatic menopausal or female climacteric states   . Syncope     Surgical Hx:  Past Surgical History:  Procedure Laterality Date  . ABDOMINAL HYSTERECTOMY  1993   Total  . BUNIONECTOMY Bilateral 1993  . I & D KNEE WITH POLY EXCHANGE Right 11/19/2017   Procedure: RIGHT KNEE POLY EXCHANGE WITH IRRIGATION AND DEBRIDEMENT;  Surgeon: Hessie Knows, MD;  Location: [facility];  Service: Orthopedics;  Laterality: Right;  . JOINT REPLACEMENT     bilateral knee  . LAMINECTOMY  11/14/2013    Cervical Fusion , [facility], Dr. Delilah Shan  . LASER ABLATION Bilateral 07/29/2012   Dr. Lucky Cowboy  . ORIF ANKLE FRACTURE Right 09/02/2019   Procedure: OPEN REDUCTION INTERNAL FIXATION (ORIF) ANKLE FRACTURE BIMALLEOLAR;  Surgeon: [name], DPM;  Location: [facility];  Service: Podiatry;  Laterality: Right;  . REPLACEMENT TOTAL KNEE Left 03/2010   Dr. Rudene Christians  . TOTAL HIP ARTHROPLASTY Left 12/11/2015   Procedure: TOTAL HIP ARTHROPLASTY ANTERIOR APPROACH;  Surgeon: Hessie Knows, MD;  Location: [facility];  Service: Orthopedics;  Laterality: Left;  . TOTAL KNEE ARTHROPLASTY Right 03/11/2016   Procedure: TOTAL KNEE  ARTHROPLASTY;  Surgeon: Hessie Knows, MD;  Location: [facility];  Service: Orthopedics;  Laterality: Right;    FHx:  Family History  Problem Relation Age of Onset  . Diabetes Mother   . Hyperlipidemia Mother   . Hypertension Mother   . Diabetes Father   . Hyperlipidemia Father   . Hypertension Father   . Obesity Father   . Hypertension Sister   . Hyperlipidemia Sister   . Hyperlipidemia Sister   . Breast cancer Neg Hx     Social History:  reports that she has never smoked. She has never used smokeless tobacco. She reports that she does not drink alcohol or use drugs.  Allergies:  Allergies  Allergen Reactions  . Lisinopril Swelling    Review of Systems: General ROS: negative Respiratory ROS: no cough, shortness of breath, or wheezing Cardiovascular ROS: no chest pain or dyspnea on exertion Musculoskeletal ROS: positive for - joint pain and swelling in leg - left Neurological ROS: positive for - numbness/tingling Dermatological ROS: positive for right lateral ankle wound  Medications Prior to Admission  Medication Sig Dispense Refill  . acetaminophen (TYLENOL) 650 MG CR tablet Take 650-1,300 mg by mouth 2 (two) times daily as needed for pain.    Marland Kitchen atorvastatin (LIPITOR) 40 MG tablet Take 1 tablet (40 mg total) by mouth daily at 6 PM. (Patient taking differently: Take 40 mg by mouth every evening. )  90 tablet 1  . Calcium Carbonate-Vitamin D (CALCIUM 600-D) 600-400 MG-UNIT per tablet Take 1 tablet by mouth 2 (two) times daily.    Marland Kitchen doxycycline (VIBRAMYCIN) 100 MG capsule Take 100 mg by mouth 2 (two) times daily.    Marland Kitchen enoxaparin (LOVENOX) 40 MG/0.4ML injection Inject 0.4 mLs (40 mg total) into the skin daily. 20 mL 0  . fluticasone (FLONASE) 50 MCG/ACT nasal spray Place 2 sprays into both nostrils daily. 16 g 2  . glucose blood (ONE TOUCH ULTRA TEST) test strip Use as instructed 100 each 12  . imipramine (TOFRANIL) 25 MG tablet Take 1 tablet (25 mg total) by mouth at bedtime.  90 tablet 3  . ketoconazole (NIZORAL) 2 % cream Apply 1 application topically 2 (two) times daily as needed (rash).     . Magnesium Oxide 500 MG CAPS Take 500 mg by mouth daily.     . metoprolol succinate (TOPROL-XL) 100 MG 24 hr tablet Take 100 mg by mouth at bedtime.    . midodrine (PROAMATINE) 5 MG tablet Take 1 tablet (5 mg total) by mouth 3 (three) times daily with meals. (Patient taking differently: Take 5 mg by mouth 3 (three) times daily. ) 90 tablet 0  . mirabegron ER (MYRBETRIQ) 25 MG TB24 tablet Take 1 tablet (25 mg total) by mouth daily. 90 tablet 3  . oxyCODONE-acetaminophen (PERCOCET) 7.5-325 MG tablet Take 1 tablet by mouth every 4 (four) hours as needed for severe pain.    Marland Kitchen SIMBRINZA 1-0.2 % SUSP Place 1 drop into both eyes 2 (two) times daily.    . celecoxib (CELEBREX) 200 MG capsule Take 200 mg by mouth daily as needed for moderate pain.   11  . furosemide (LASIX) 20 MG tablet Take 20 mg by mouth every evening.    . gabapentin (NEURONTIN) 300 MG capsule Take 1 capsule (300 mg total) by mouth every evening. (Patient not taking: Reported on 09/23/2019) 90 capsule 2  . Netarsudil-Latanoprost (ROCKLATAN) 0.02-0.005 % SOLN Place 1 drop into both eyes at bedtime.    Marland Kitchen oxycodone (OXY-IR) 5 MG capsule Take 5 mg by mouth every 6 (six) hours as needed for pain.    Marland Kitchen oxyCODONE-acetaminophen (PERCOCET/ROXICET) 5-325 MG tablet Take 1 tablet by mouth as directed.    . potassium chloride (KLOR-CON) 10 MEQ tablet Take 10 mEq by mouth daily.    . traZODone (DESYREL) 50 MG tablet TAKE 1 TABLET BY MOUTH AT BEDTIME AS NEEDED FOR SLEEP (Patient not taking: Reported on 10/27/2019) 90 tablet 1  . XARELTO 20 MG TABS tablet Take 20 mg by mouth daily.      Physical Exam: General: Alert and oriented.  No apparent distress.  Vascular: DP/PT pulses faintly palpable bil.  CFT intact to digits bil.  Mild nonpitting edema to the right lower extremity.  Erythema overall absent to the right lower extremity since  admission, much improved.  Neuro: Light touch sensation reduced to digits bil.  Derm: Central aspect of incision line right lateral ankle over the fibular dehiscence site probes to deep subQ tissue, approximately 0.7cm deep, mild serousanginous drainage today, no odor, no periwound erythema present, no odor, no visible plate or bone.  MSK: No POP R calf area.  No POP of the R lateral ankle.  No pain with AJ DF and PF R.   Results for orders placed or performed during the hospital encounter of 10/27/19 (from the past 48 hour(s))  Glucose, capillary     Status: None  Collection Time: 10/30/19  9:22 PM  Result Value Ref Range   Glucose-Capillary 96 70 - 99 mg/dL  CBC     Status: Abnormal   Collection Time: 10/31/19  5:26 AM  Result Value Ref Range   WBC 6.9 4.0 - 10.5 K/uL   RBC 3.85 (L) 3.87 - 5.11 MIL/uL   Hemoglobin 11.2 (L) 12.0 - 15.0 g/dL   HCT 34.9 (L) 36.0 - 46.0 %   MCV 90.6 80.0 - 100.0 fL   MCH 29.1 26.0 - 34.0 pg   MCHC 32.1 30.0 - 36.0 g/dL   RDW 16.6 (H) 11.5 - 15.5 %   Platelets 146 (L) 150 - 400 K/uL   nRBC 0.0 0.0 - 0.2 %    Comment: Performed at [facility], [address] [place], [place] [id number]  Basic metabolic panel     Status: Abnormal   Collection Time: 10/31/19  5:26 AM  Result Value Ref Range   Sodium 138 135 - 145 mmol/L   Potassium 3.5 3.5 - 5.1 mmol/L   Chloride 105 98 - 111 mmol/L   CO2 24 22 - 32 mmol/L   Glucose, Bld 90 70 - 99 mg/dL   BUN 11 8 - 23 mg/dL   Creatinine, Ser 0.93 0.44 - 1.00 mg/dL   Calcium 8.8 (L) 8.9 - 10.3 mg/dL   GFR calc non Af Amer >60 >60 mL/min   GFR calc Af Amer >60 >60 mL/min   Anion gap 9 5 - 15    Comment: Performed at [facility], [address] [place], [place] [address]  Glucose, capillary     Status: None   Collection Time: 10/31/19  7:55 AM  Result Value Ref Range   Glucose-Capillary 85 70 - 99 mg/dL  Glucose, capillary     Status: None   Collection Time: 10/31/19 11:42 AM  Result  Value Ref Range   Glucose-Capillary 95 70 - 99 mg/dL  Glucose, capillary     Status: None   Collection Time: 10/31/19  4:18 PM  Result Value Ref Range   Glucose-Capillary 98 70 - 99 mg/dL  Glucose, capillary     Status: Abnormal   Collection Time: 10/31/19  9:28 PM  Result Value Ref Range   Glucose-Capillary 114 (H) 70 - 99 mg/dL  CBC     Status: Abnormal   Collection Time: 11/01/19  1:59 AM  Result Value Ref Range   WBC 9.9 4.0 - 10.5 K/uL   RBC 3.73 (L) 3.87 - 5.11 MIL/uL   Hemoglobin 10.9 (L) 12.0 - 15.0 g/dL   HCT 34.3 (L) 36.0 - 46.0 %   MCV 92.0 80.0 - 100.0 fL   MCH 29.2 26.0 - 34.0 pg   MCHC 31.8 30.0 - 36.0 g/dL   RDW 16.0 (H) 11.5 - 15.5 %   Platelets 158 150 - 400 K/uL   nRBC 0.0 0.0 - 0.2 %    Comment: Performed at [facility], [address] [place], [place] [id number]  Basic metabolic panel     Status: Abnormal   Collection Time: 11/01/19  1:59 AM  Result Value Ref Range   Sodium 138 135 - 145 mmol/L   Potassium 3.8 3.5 - 5.1 mmol/L   Chloride 103 98 - 111 mmol/L   CO2 27 22 - 32 mmol/L   Glucose, Bld 107 (H) 70 - 99 mg/dL   BUN 9 8 - 23 mg/dL   Creatinine, Ser 0.89 0.44 - 1.00 mg/dL  Calcium 9.0 8.9 - 10.3 mg/dL   GFR calc non Af Amer >60 >60 mL/min   GFR calc Af Amer >60 >60 mL/min   Anion gap 8 5 - 15    Comment: Performed at [facility], [address] [place], [place] [address]  CK     Status: Abnormal   Collection Time: 11/01/19  1:59 AM  Result Value Ref Range   Total CK 541 (H) 38 - 234 U/L    Comment: Performed at [facility], [address] [place], [place] [address]  Glucose, capillary     Status: None   Collection Time: 11/01/19  4:41 AM  Result Value Ref Range   Glucose-Capillary 86 70 - 99 mg/dL  CK     Status: Abnormal   Collection Time: 11/01/19  6:30 AM  Result Value Ref Range   Total CK 654 (H) 38 - 234 U/L    Comment: Performed at [facility], [address] [place], [place] [address]   Glucose, capillary     Status: None   Collection Time: 11/01/19  7:53 AM  Result Value Ref Range   Glucose-Capillary 88 70 - 99 mg/dL  Glucose, capillary     Status: Abnormal   Collection Time: 11/01/19 12:00 PM  Result Value Ref Range   Glucose-Capillary 107 (H) 70 - 99 mg/dL   DG Lumbar Spine 2-3 Views  Result Date: 10/31/2019 CLINICAL DATA:  Left thigh pain EXAM: LUMBAR SPINE - 2-3 VIEW COMPARISON:  None. FINDINGS: Five lumbar type vertebral bodies are well visualized. Vertebral body height is well maintained. Degenerative anterolisthesis of L4 on L5 is noted. Facet hypertrophic changes are seen. Changes of prior left hip replacement are noted. Degenerative changes of the right hip are seen. Aortic calcifications are noted. IMPRESSION: Degenerative change with anterolisthesis of L4 on L5. Electronically Signed   By: Inez Catalina M.D.   On: 10/31/2019 16:41   CT ANKLE RIGHT W CONTRAST  Result Date: 10/31/2019 CLINICAL DATA:  Dehiscence of the surgical wound along the right lateral ankle. Prior plate and screw fixation of the lateral malleolus. EXAM: CT OF THE RIGHT ANKLE WITH CONTRAST TECHNIQUE: Multidetector CT imaging of the right ankle was performed following the standard protocol during bolus administration of intravenous contrast. CONTRAST:  182mL OMNIPAQUE IOHEXOL 300 MG/ML  SOLN COMPARISON:  Radiographs from 10/28/2019 FINDINGS: Bones/Joint/Cartilage Lateral malleolar plate and screw fixator traversing the oblique fracture, with lack of bony bridging and early cortication along the fracture plane favoring early nonunion. Concern was raised on radiography for lucency around the screws but currently no abnormal lucency is observed around the 5 screws proximal to the fracture or 2 screws distal to the fracture to favor loosening or infection. No bony destructive findings are identified. Small fragments along the anterior margin of the fracture plane indicate mild prior comminution There is  periosteal reaction along the posterior tibial metaphysis along with a corticated ossicle along the posteroinferior tibial rim indicating a likely small posterior malleolar fracture component of the prior injury, favoring at least a stage III Weber B injury. There is a small ossicle below the medial malleolus but it appears well corticated and does not encompass the entire attachment of the deep tibiotalar portion of the deltoid ligament., which grossly appears intact, implying that this was probably not a stage IV Weber B injury. Mild irregular spurring with some underlying lucency in the medial talar dome posteriorly for example on images 80-85 of series 6, favoring degenerative  spurring and potentially a small sagittally oriented non-fragmented chronic osteochondral injury. Degenerative arthropathy of the Lisfranc joint medially; only part of the Lisfranc joint is included on today's examination but there is clearly considerable spurring and loss of articular space between the medial cuneiform and base of the first metatarsal, and the middle cuneiform and the base of the second metatarsal. Please note that the Lisfranc joint is not included in its entirety. Mild articular surface irregularity along the posterior tibial plafond for example on image 44/7. Plantar calcaneal spur noted. Ligaments Suboptimally assessed by CT. My sense on image 83/9 is that the deep tibiotalar portion of the deltoid ligament is probably intact, although there is some indistinctness and surrounding edema. The tibionavicular and tibiospring portions of deltoid ligament are obscured by surrounding edema. Small bony fragments just above the expected vicinity of the anterior talofibular ligament. Muscles and Tendons No flexor tendon entrapment. Expanded contour of the tibialis posterior tendon compatible with tendinopathy. Soft tissues Subcutaneous edema surrounding the ankle and tracking in the dorsum of the foot and the heel. There is  some confluent mild cutaneous irregularity overlying the lateral plate and screw fixator for example on image 51/8 at least partially from overlying bandaging, but reportedly this is the region of the cutaneous wound. I do not see any gas tracking in the soft tissues. I do not observe a drainable rim enhancing fluid collection to suggest abscess. IMPRESSION: 1. Lateral malleolar plate and screw fixator traversing the oblique fracture, with lack of bony bridging and early cortication along the fracture plane favoring early nonunion. There is also some periosteal reaction along the posterior tibial metaphysis compatible with a small posterior malleolar fracture component of the prior injury. Appearance compatible with previous Weber B stage III fracture. 2. No current lucency along the screws of the plate and screw fixator to suggest loosening or infection around the screws. 3. Small ossicle below the medial malleolus appears well corticated and does not encompass the entire attachment of the deep tibiotalar portion of the deltoid ligament. 4. Degenerative arthropathy of the Lisfranc joint medially, only part of the Lisfranc joint is included on today's examination. 5. Plantar calcaneal spur. 6. Expanded contour of the tibialis posterior tendon compatible with tendinopathy. Correlate clinically in assessing for tibialis posterior dysfunction. 7. Subcutaneous edema surrounding the ankle and tracking in the dorsum of the foot and heel. No drainable abscess is identified. No gas tracking in the soft tissues. 8. Mild articular surface irregularity along the posterior tibial plafond, query a small sagittally oriented non-fragmented chronic osteochondral injury. Electronically Signed   By: Van Clines M.D.   On: 10/31/2019 08:23   CT FEMUR LEFT W CONTRAST  Result Date: 10/31/2019 CLINICAL DATA:  Acute onset of left thigh pain. Remote left hip arthroplasty. EXAM: CT OF THE LOWER RIGHT EXTREMITY WITH CONTRAST  TECHNIQUE: Multidetector CT imaging of the lower right extremity was performed according to the standard protocol following intravenous contrast administration. COMPARISON:  Radiographs, same date. CONTRAST:  156mL OMNIPAQUE IOHEXOL 300 MG/ML  SOLN FINDINGS: The left hip prosthesis is intact. No findings suspicious for periprosthetic fracture or loosening. No dislocation. The left femur is intact. A total left knee arthroplasty is noted. No obvious complicating features. Marked enlargement of the left iliopsoas muscle suggesting hematoma, possible spontaneous bleed or tear, if there was trauma. This is likely causing the patient's left hip pain. There is also some subcutaneous edema or hemorrhage in the lateral hip area. No significant intrapelvic abnormalities are identified. The  pubic symphysis and pubic bones are intact. Advanced degenerative changes are noted involving the right hip. IMPRESSION: 1. Marked enlargement of the left iliopsoas muscle suggesting hematoma, possible spontaneous bleed or tear, if there was trauma. 2. Intact left hip prosthesis and no findings suspicious for periprosthetic fracture or loosening. 3. No significant intrapelvic abnormalities. 4. Advanced degenerative changes involving the right hip. Electronically Signed   By: Marijo Sanes M.D.   On: 10/31/2019 16:46   DG HIP UNILAT WITH PELVIS 2-3 VIEWS LEFT  Result Date: 10/31/2019 CLINICAL DATA:  Left thigh pain EXAM: DG HIP (WITH OR WITHOUT PELVIS) 2-3V LEFT COMPARISON:  08/07/2019 FINDINGS: Pelvic ring is intact. Changes of prior left hip replacement are noted. Significant degenerative changes in the right hip are seen. No soft tissue abnormality is seen. IMPRESSION: Chronic changes without acute abnormality. Electronically Signed   By: Inez Catalina M.D.   On: 10/31/2019 16:42    Blood pressure 106/64, pulse 71, temperature 97.8 F (36.6 C), temperature source Oral, resp. rate 18, height 5\' 11"  (1.803 m), weight 116.2 kg, SpO2  91 %.  Assessment 1. Bacteremia with leukocytosis and altered mental status potentially 2/2 R lateral ankle wound vs other source? 2. DVT right lower extremity 3. Diabetes type 2 polyneuropathy 4. Right ankle fracture status post ORIF with delayed healing  Plan -Patient seen and examined. -Applied saline soaked gauze to the wound followed by 4 x 4 gauze, ABD, Kerlix, and Ace wrap from the forefoot to below knee.  Change every other day.  Order placed. -Appreciate recommendations per infectious disease for antibiotic therapy.  Blood culture from 3 days ago with no growth to date.  Wound culture with gram-positive cocci and gram-positive rods.  Few strep agalactiae, Acinetobacter rare growth from wound culture.  Blood culture from 4 days ago with strep agalactiae.  Recommend outpatient IV antibiotics. -Appreciate vascular recommendations.  Planning for IVC filter and LE angiogram in the coming days. -Believe no surgical intervention is indicated at this time to the R ankle wound as the wound appears to not go to bone at this point as there is no plate or bone exposed.  We will continue to monitor the situation outpatient.  Earliest time to potentially remove hardware be at the 76-month mark postoperatively.  Patient is currently at 8 weeks. -Patient to be nonweightbearing still to the right lower extremity.   Podiatry team to continue to follow until discharge. Discharge instructions placed in chart.  [name], DPM 11/01/2019, 2:06 PM

## 2019-11-01 NOTE — Consult Note (Signed)
Reason for Consult: Left hip pain Referring Physician: Dr. Ferdie Ping is an 74 y.o. female.  HPI: Patient is admitted with sepsis and subsequent hip pain she had a CT yesterday that showed iliac is hematoma which would be consistent with her symptoms.  I have seen her for many years for orthopedic complaints.  She has had bilateral knee replacements and subsequently had a infected total knee on the right successfully treated with a single-stage replacement.  Additionally she has had a left total hip and needs a right total hip for severe arthritis there.  She recently had a right distal fibula fracture treated by Dr. Luana Shu.  She has been having quite a bit of problems with swelling in her legs for some time.  Also recent history of DVT.  She was in the office earlier this year with a very large buttock hematoma which was successfully drained with ultrasound and did not recur that was in the left buttock.  No evidence of infection from that.  Past Medical History:  Diagnosis Date  . Cervical spondylosis   . Chronic kidney disease    Stage 3  . DDD (degenerative disc disease), cervical    [facility]  . Diabetes mellitus without complication ([place])   . Diverticulosis   . Hyperlipidemia   . Hypertension   . Intertrigo   . Leg cramps   . Leg varices   . Obesity   . OSA (obstructive sleep apnea)   . Symptomatic menopausal or female climacteric states   . Syncope     Past Surgical History:  Procedure Laterality Date  . ABDOMINAL HYSTERECTOMY  1993   Total  . BUNIONECTOMY Bilateral 1993  . I & D KNEE WITH POLY EXCHANGE Right 11/19/2017   Procedure: RIGHT KNEE POLY EXCHANGE WITH IRRIGATION AND DEBRIDEMENT;  Surgeon: Hessie Knows, MD;  Location: [facility] OR[facility];  Service: Orthopedics;  Laterality: Right;  . JOINT REPLACEMENT     bilateral knee  . LAMINECTOMY  11/14/2013    Cervical Fusion , [facility], Dr. Delilah Shan  . LASER ABLATION Bilateral 07/29/2012   Dr. Lucky Cowboy  . ORIF ANKLE  FRACTURE Right 09/02/2019   Procedure: OPEN REDUCTION INTERNAL FIXATION (ORIF) ANKLE FRACTURE BIMALLEOLAR;  Surgeon: [name], DPM;  Location: [facility] OR[facility];  Service: Podiatry;  Laterality: Right;  . REPLACEMENT TOTAL KNEE Left 03/2010   Dr. Rudene Christians  . TOTAL HIP ARTHROPLASTY Left 12/11/2015   Procedure: TOTAL HIP ARTHROPLASTY ANTERIOR APPROACH;  Surgeon: Hessie Knows, MD;  Location: [facility] OR[facility];  Service: Orthopedics;  Laterality: Left;  . TOTAL KNEE ARTHROPLASTY Right 03/11/2016   Procedure: TOTAL KNEE ARTHROPLASTY;  Surgeon: Hessie Knows, MD;  Location: [facility];  Service: Orthopedics;  Laterality: Right;    Family History  Problem Relation Age of Onset  . Diabetes Mother   . Hyperlipidemia Mother   . Hypertension Mother   . Diabetes Father   . Hyperlipidemia Father   . Hypertension Father   . Obesity Father   . Hypertension Sister   . Hyperlipidemia Sister   . Hyperlipidemia Sister   . Breast cancer Neg Hx     Social History:  reports that she has never smoked. She has never used smokeless tobacco. She reports that she does not drink alcohol or use drugs.  Allergies:  Allergies  Allergen Reactions  . Lisinopril Swelling    Medications: I have reviewed the patient's current medications.  Results for orders placed or performed during the hospital encounter of 10/27/19 (from the past  48 hour(s))  Glucose, capillary     Status: None   Collection Time: 10/30/19  9:22 PM  Result Value Ref Range   Glucose-Capillary 96 70 - 99 mg/dL  CBC     Status: Abnormal   Collection Time: 10/31/19  5:26 AM  Result Value Ref Range   WBC 6.9 4.0 - 10.5 K/uL   RBC 3.85 (L) 3.87 - 5.11 MIL/uL   Hemoglobin 11.2 (L) 12.0 - 15.0 g/dL   HCT 34.9 (L) 36.0 - 46.0 %   MCV 90.6 80.0 - 100.0 fL   MCH 29.1 26.0 - 34.0 pg   MCHC 32.1 30.0 - 36.0 g/dL   RDW 16.6 (H) 11.5 - 15.5 %   Platelets 146 (L) 150 - 400 K/uL   nRBC 0.0 0.0 - 0.2 %    Comment: Performed at [facility], [address] [place], [place] [id number]  Basic metabolic panel     Status: Abnormal   Collection Time: 10/31/19  5:26 AM  Result Value Ref Range   Sodium 138 135 - 145 mmol/L   Potassium 3.5 3.5 - 5.1 mmol/L   Chloride 105 98 - 111 mmol/L   CO2 24 22 - 32 mmol/L   Glucose, Bld 90 70 - 99 mg/dL   BUN 11 8 - 23 mg/dL   Creatinine, Ser 0.93 0.44 - 1.00 mg/dL   Calcium 8.8 (L) 8.9 - 10.3 mg/dL   GFR calc non Af Amer >60 >60 mL/min   GFR calc Af Amer >60 >60 mL/min   Anion gap 9 5 - 15    Comment: Performed at [facility], [address] [place], [place] [address]  Glucose, capillary     Status: None   Collection Time: 10/31/19  7:55 AM  Result Value Ref Range   Glucose-Capillary 85 70 - 99 mg/dL  Glucose, capillary     Status: None   Collection Time: 10/31/19 11:42 AM  Result Value Ref Range   Glucose-Capillary 95 70 - 99 mg/dL  Glucose, capillary     Status: None   Collection Time: 10/31/19  4:18 PM  Result Value Ref Range   Glucose-Capillary 98 70 - 99 mg/dL  Glucose, capillary     Status: Abnormal   Collection Time: 10/31/19  9:28 PM  Result Value Ref Range   Glucose-Capillary 114 (H) 70 - 99 mg/dL  CBC     Status: Abnormal   Collection Time: 11/01/19  1:59 AM  Result Value Ref Range   WBC 9.9 4.0 - 10.5 K/uL   RBC 3.73 (L) 3.87 - 5.11 MIL/uL   Hemoglobin 10.9 (L) 12.0 - 15.0 g/dL   HCT 34.3 (L) 36.0 - 46.0 %   MCV 92.0 80.0 - 100.0 fL   MCH 29.2 26.0 - 34.0 pg   MCHC 31.8 30.0 - 36.0 g/dL   RDW 16.0 (H) 11.5 - 15.5 %   Platelets 158 150 - 400 K/uL   nRBC 0.0 0.0 - 0.2 %    Comment: Performed at [facility], [address] [place], [place] [id number]  Basic metabolic panel     Status: Abnormal   Collection Time: 11/01/19  1:59 AM  Result Value Ref Range   Sodium 138 135 - 145 mmol/L   Potassium 3.8 3.5 - 5.1 mmol/L   Chloride 103 98 - 111 mmol/L   CO2 27 22 - 32 mmol/L   Glucose, Bld 107 (H) 70 - 99 mg/dL   BUN 9 8 -  23 mg/dL   Creatinine, Ser 0.89 0.44  - 1.00 mg/dL   Calcium 9.0 8.9 - 10.3 mg/dL   GFR calc non Af Amer >60 >60 mL/min   GFR calc Af Amer >60 >60 mL/min   Anion gap 8 5 - 15    Comment: Performed at [facility], [address] [place], [place] [address]  CK     Status: Abnormal   Collection Time: 11/01/19  1:59 AM  Result Value Ref Range   Total CK 541 (H) 38 - 234 U/L    Comment: Performed at [facility], [address] [place], [place] [address]  Glucose, capillary     Status: None   Collection Time: 11/01/19  4:41 AM  Result Value Ref Range   Glucose-Capillary 86 70 - 99 mg/dL  CK     Status: Abnormal   Collection Time: 11/01/19  6:30 AM  Result Value Ref Range   Total CK 654 (H) 38 - 234 U/L    Comment: Performed at [facility], [address] [place], [place] [address]  Glucose, capillary     Status: None   Collection Time: 11/01/19  7:53 AM  Result Value Ref Range   Glucose-Capillary 88 70 - 99 mg/dL  Glucose, capillary     Status: Abnormal   Collection Time: 11/01/19 12:00 PM  Result Value Ref Range   Glucose-Capillary 107 (H) 70 - 99 mg/dL    DG Lumbar Spine 2-3 Views  Result Date: 10/31/2019 CLINICAL DATA:  Left thigh pain EXAM: LUMBAR SPINE - 2-3 VIEW COMPARISON:  None. FINDINGS: Five lumbar type vertebral bodies are well visualized. Vertebral body height is well maintained. Degenerative anterolisthesis of L4 on L5 is noted. Facet hypertrophic changes are seen. Changes of prior left hip replacement are noted. Degenerative changes of the right hip are seen. Aortic calcifications are noted. IMPRESSION: Degenerative change with anterolisthesis of L4 on L5. Electronically Signed   By: Inez Catalina M.D.   On: 10/31/2019 16:41   CT ANKLE RIGHT W CONTRAST  Result Date: 10/31/2019 CLINICAL DATA:  Dehiscence of the surgical wound along the right lateral ankle. Prior plate and screw fixation of the lateral malleolus. EXAM: CT OF THE RIGHT ANKLE WITH CONTRAST TECHNIQUE:  Multidetector CT imaging of the right ankle was performed following the standard protocol during bolus administration of intravenous contrast. CONTRAST:  115mL OMNIPAQUE IOHEXOL 300 MG/ML  SOLN COMPARISON:  Radiographs from 10/28/2019 FINDINGS: Bones/Joint/Cartilage Lateral malleolar plate and screw fixator traversing the oblique fracture, with lack of bony bridging and early cortication along the fracture plane favoring early nonunion. Concern was raised on radiography for lucency around the screws but currently no abnormal lucency is observed around the 5 screws proximal to the fracture or 2 screws distal to the fracture to favor loosening or infection. No bony destructive findings are identified. Small fragments along the anterior margin of the fracture plane indicate mild prior comminution There is periosteal reaction along the posterior tibial metaphysis along with a corticated ossicle along the posteroinferior tibial rim indicating a likely small posterior malleolar fracture component of the prior injury, favoring at least a stage III Weber B injury. There is a small ossicle below the medial malleolus but it appears well corticated and does not encompass the entire attachment of the deep tibiotalar portion of the deltoid ligament., which grossly appears intact, implying that this was probably not a stage IV Weber B injury. Mild irregular spurring with some underlying lucency in the  medial talar dome posteriorly for example on images 80-85 of series 6, favoring degenerative spurring and potentially a small sagittally oriented non-fragmented chronic osteochondral injury. Degenerative arthropathy of the Lisfranc joint medially; only part of the Lisfranc joint is included on today's examination but there is clearly considerable spurring and loss of articular space between the medial cuneiform and base of the first metatarsal, and the middle cuneiform and the base of the second metatarsal. Please note that the  Lisfranc joint is not included in its entirety. Mild articular surface irregularity along the posterior tibial plafond for example on image 44/7. Plantar calcaneal spur noted. Ligaments Suboptimally assessed by CT. My sense on image 83/9 is that the deep tibiotalar portion of the deltoid ligament is probably intact, although there is some indistinctness and surrounding edema. The tibionavicular and tibiospring portions of deltoid ligament are obscured by surrounding edema. Small bony fragments just above the expected vicinity of the anterior talofibular ligament. Muscles and Tendons No flexor tendon entrapment. Expanded contour of the tibialis posterior tendon compatible with tendinopathy. Soft tissues Subcutaneous edema surrounding the ankle and tracking in the dorsum of the foot and the heel. There is some confluent mild cutaneous irregularity overlying the lateral plate and screw fixator for example on image 51/8 at least partially from overlying bandaging, but reportedly this is the region of the cutaneous wound. I do not see any gas tracking in the soft tissues. I do not observe a drainable rim enhancing fluid collection to suggest abscess. IMPRESSION: 1. Lateral malleolar plate and screw fixator traversing the oblique fracture, with lack of bony bridging and early cortication along the fracture plane favoring early nonunion. There is also some periosteal reaction along the posterior tibial metaphysis compatible with a small posterior malleolar fracture component of the prior injury. Appearance compatible with previous Weber B stage III fracture. 2. No current lucency along the screws of the plate and screw fixator to suggest loosening or infection around the screws. 3. Small ossicle below the medial malleolus appears well corticated and does not encompass the entire attachment of the deep tibiotalar portion of the deltoid ligament. 4. Degenerative arthropathy of the Lisfranc joint medially, only part of the  Lisfranc joint is included on today's examination. 5. Plantar calcaneal spur. 6. Expanded contour of the tibialis posterior tendon compatible with tendinopathy. Correlate clinically in assessing for tibialis posterior dysfunction. 7. Subcutaneous edema surrounding the ankle and tracking in the dorsum of the foot and heel. No drainable abscess is identified. No gas tracking in the soft tissues. 8. Mild articular surface irregularity along the posterior tibial plafond, query a small sagittally oriented non-fragmented chronic osteochondral injury. Electronically Signed   By: Van Clines M.D.   On: 10/31/2019 08:23   CT FEMUR LEFT W CONTRAST  Result Date: 10/31/2019 CLINICAL DATA:  Acute onset of left thigh pain. Remote left hip arthroplasty. EXAM: CT OF THE LOWER RIGHT EXTREMITY WITH CONTRAST TECHNIQUE: Multidetector CT imaging of the lower right extremity was performed according to the standard protocol following intravenous contrast administration. COMPARISON:  Radiographs, same date. CONTRAST:  170mL OMNIPAQUE IOHEXOL 300 MG/ML  SOLN FINDINGS: The left hip prosthesis is intact. No findings suspicious for periprosthetic fracture or loosening. No dislocation. The left femur is intact. A total left knee arthroplasty is noted. No obvious complicating features. Marked enlargement of the left iliopsoas muscle suggesting hematoma, possible spontaneous bleed or tear, if there was trauma. This is likely causing the patient's left hip pain. There is also some subcutaneous edema  or hemorrhage in the lateral hip area. No significant intrapelvic abnormalities are identified. The pubic symphysis and pubic bones are intact. Advanced degenerative changes are noted involving the right hip. IMPRESSION: 1. Marked enlargement of the left iliopsoas muscle suggesting hematoma, possible spontaneous bleed or tear, if there was trauma. 2. Intact left hip prosthesis and no findings suspicious for periprosthetic fracture or  loosening. 3. No significant intrapelvic abnormalities. 4. Advanced degenerative changes involving the right hip. Electronically Signed   By: Marijo Sanes M.D.   On: 10/31/2019 16:46   DG HIP UNILAT WITH PELVIS 2-3 VIEWS LEFT  Result Date: 10/31/2019 CLINICAL DATA:  Left thigh pain EXAM: DG HIP (WITH OR WITHOUT PELVIS) 2-3V LEFT COMPARISON:  08/07/2019 FINDINGS: Pelvic ring is intact. Changes of prior left hip replacement are noted. Significant degenerative changes in the right hip are seen. No soft tissue abnormality is seen. IMPRESSION: Chronic changes without acute abnormality. Electronically Signed   By: Inez Catalina M.D.   On: 10/31/2019 16:42    Review of Systems Blood pressure 106/64, pulse 71, temperature 97.8 F (36.6 C), temperature source Oral, resp. rate 18, height 5\' 11"  (1.803 m), weight 116.2 kg, SpO2 91 %. Physical Exam she does not have pain with logrolling note no mass palpated on thigh exam. She has intact flexion extension of her foot with significant peripheral edema in both lower extremities.  Her lower extremity is warm.  Assessment/Plan: Hip pain secondary to iliopsoas hematoma, apparently spontaneous hematoma.  Dr. [name] to evaluate circulation and place a vena cava filter tomorrow.  Hematoma should resolve with time  Hessie Knows 11/01/2019, 4:37 PM

## 2019-11-01 NOTE — Progress Notes (Signed)
Per Dr. Luana Shu dressing to right leg does not need to be changed today. It will be changed tomorrow after her procedure. Dr. Luana Shu said he will change it when he comes to see her tomorrow or the nurse can change it later in the day.

## 2019-11-02 ENCOUNTER — Encounter: Payer: Self-pay | Admitting: Cardiology

## 2019-11-02 ENCOUNTER — Encounter
Admission: EM | Disposition: A | Discharge status: Skilled Nursing Facility | Payer: Self-pay | Source: Home / Self Care | Attending: Internal Medicine

## 2019-11-02 DIAGNOSIS — Z95828 Presence of other vascular implants and grafts: Secondary | ICD-10-CM

## 2019-11-02 DIAGNOSIS — I82431 Acute embolism and thrombosis of right popliteal vein: Secondary | ICD-10-CM

## 2019-11-02 DIAGNOSIS — T84629A Infection and inflammatory reaction due to internal fixation device of unspecified bone of leg, initial encounter: Secondary | ICD-10-CM

## 2019-11-02 DIAGNOSIS — M79652 Pain in left thigh: Secondary | ICD-10-CM

## 2019-11-02 DIAGNOSIS — Z888 Allergy status to other drugs, medicaments and biological substances status: Secondary | ICD-10-CM

## 2019-11-02 DIAGNOSIS — I70233 Atherosclerosis of native arteries of right leg with ulceration of ankle: Secondary | ICD-10-CM

## 2019-11-02 DIAGNOSIS — T148XXA Other injury of unspecified body region, initial encounter: Secondary | ICD-10-CM

## 2019-11-02 HISTORY — PX: LOWER EXTREMITY ANGIOGRAPHY: CATH118251

## 2019-11-02 LAB — PROTIME-INR
INR: 1.1 (ref 0.8–1.2)
Prothrombin Time: 14 seconds (ref 11.4–15.2)

## 2019-11-02 LAB — BASIC METABOLIC PANEL
Anion gap: 9 (ref 5–15)
BUN: 10 mg/dL (ref 8–23)
CO2: 24 mmol/L (ref 22–32)
Calcium: 8.7 mg/dL — ABNORMAL LOW (ref 8.9–10.3)
Chloride: 105 mmol/L (ref 98–111)
Creatinine, Ser: 0.94 mg/dL (ref 0.44–1.00)
GFR calc Af Amer: >60 mL/min (ref >60)
GFR calc non Af Amer: 60 mL/min — ABNORMAL LOW (ref >60)
Glucose, Bld: 89 mg/dL (ref 70–99)
Potassium: 3.4 mmol/L — ABNORMAL LOW (ref 3.5–5.1)
Sodium: 138 mmol/L (ref 135–145)

## 2019-11-02 LAB — CBC
HCT: 32.5 % — ABNORMAL LOW (ref 36.0–46.0)
Hemoglobin: 10.2 g/dL — ABNORMAL LOW (ref 12.0–15.0)
MCH: 29.1 pg (ref 26.0–34.0)
MCHC: 31.4 g/dL (ref 30.0–36.0)
MCV: 92.6 fL (ref 80.0–100.0)
Platelets: 182 10*3/uL (ref 150–400)
RBC: 3.51 MIL/uL — ABNORMAL LOW (ref 3.87–5.11)
RDW: 16.6 % — ABNORMAL HIGH (ref 11.5–15.5)
WBC: 10.6 10*3/uL — ABNORMAL HIGH (ref 4.0–10.5)
nRBC: 0.5 % — ABNORMAL HIGH (ref 0.0–0.2)

## 2019-11-02 LAB — GLUCOSE, CAPILLARY
Glucose-Capillary: 146 mg/dL — ABNORMAL HIGH (ref 70–99)
Glucose-Capillary: 85 mg/dL (ref 70–99)
Glucose-Capillary: 87 mg/dL (ref 70–99)
Glucose-Capillary: 90 mg/dL (ref 70–99)
Glucose-Capillary: 95 mg/dL (ref 70–99)
Glucose-Capillary: 96 mg/dL (ref 70–99)

## 2019-11-02 LAB — MAGNESIUM: Magnesium: 2.2 mg/dL (ref 1.7–2.4)

## 2019-11-02 LAB — APTT: aPTT: 36 seconds (ref 24–36)

## 2019-11-02 SURGERY — LOWER EXTREMITY ANGIOGRAPHY
Anesthesia: Moderate Sedation | Laterality: Right

## 2019-11-02 MED ORDER — METHYLPREDNISOLONE SODIUM SUCC 125 MG IJ SOLR: 125.0000 mg | Freq: Once | INTRAMUSCULAR | Status: DC | PRN

## 2019-11-02 MED ORDER — FENTANYL CITRATE (PF) 100 MCG/2ML IJ SOLN
INTRAMUSCULAR | Status: AC
  Filled 2019-11-02: qty 2

## 2019-11-02 MED ORDER — FAMOTIDINE 20 MG PO TABS: 40.0000 mg | ORAL_TABLET | Freq: Once | ORAL | Status: DC | PRN

## 2019-11-02 MED ORDER — HEPARIN SODIUM (PORCINE) 1000 UNIT/ML IJ SOLN
INTRAMUSCULAR | Status: AC
  Filled 2019-11-02: qty 1

## 2019-11-02 MED ORDER — SODIUM CHLORIDE 0.9 % IV SOLN: INTRAVENOUS | Status: DC

## 2019-11-02 MED ORDER — MIDAZOLAM HCL 2 MG/2ML IJ SOLN
INTRAMUSCULAR | Status: DC | PRN
  Administered 2019-11-02: 1 mg via INTRAVENOUS
  Administered 2019-11-02: 2 mg via INTRAVENOUS

## 2019-11-02 MED ORDER — CEFAZOLIN SODIUM-DEXTROSE 2-4 GM/100ML-% IV SOLN: 2.0000 g | Freq: Once | INTRAVENOUS | Status: DC

## 2019-11-02 MED ORDER — DIPHENHYDRAMINE HCL 50 MG/ML IJ SOLN: 50.0000 mg | Freq: Once | INTRAMUSCULAR | Status: DC | PRN

## 2019-11-02 MED ORDER — MIDAZOLAM HCL 5 MG/5ML IJ SOLN
INTRAMUSCULAR | Status: AC
  Filled 2019-11-02: qty 5

## 2019-11-02 MED ORDER — MIDAZOLAM HCL 2 MG/ML PO SYRP: 8.0000 mg | ORAL_SOLUTION | Freq: Once | ORAL | Status: DC | PRN

## 2019-11-02 MED ORDER — CHLORHEXIDINE GLUCONATE CLOTH 2 % EX PADS
6.0000 | MEDICATED_PAD | Freq: Every day | CUTANEOUS | Status: DC
  Administered 2019-11-02 – 2019-11-07 (×6): 6 via TOPICAL

## 2019-11-02 MED ORDER — HYDROMORPHONE HCL 1 MG/ML IJ SOLN: 1.0000 mg | Freq: Once | INTRAMUSCULAR | Status: DC | PRN

## 2019-11-02 MED ORDER — HYDROMORPHONE HCL 1 MG/ML IJ SOLN
1.0000 mg | Freq: Once | INTRAMUSCULAR | Status: AC | PRN
  Administered 2019-11-02: 1 mg via INTRAVENOUS
  Filled 2019-11-02: qty 1

## 2019-11-02 MED ORDER — IODIXANOL 320 MG/ML IV SOLN
INTRAVENOUS | Status: DC | PRN
  Administered 2019-11-02: 12:00:00 60 mL via INTRA_ARTERIAL

## 2019-11-02 MED ORDER — CEFAZOLIN SODIUM-DEXTROSE 2-4 GM/100ML-% IV SOLN
2.0000 g | Freq: Once | INTRAVENOUS | Status: AC
  Administered 2019-11-02: 11:00:00 2 g via INTRAVENOUS

## 2019-11-02 MED ORDER — FENTANYL CITRATE (PF) 100 MCG/2ML IJ SOLN
INTRAMUSCULAR | Status: DC | PRN
  Administered 2019-11-02 (×2): 50 ug via INTRAVENOUS

## 2019-11-02 MED ORDER — ONDANSETRON HCL 4 MG/2ML IJ SOLN: 4.0000 mg | Freq: Four times a day (QID) | INTRAMUSCULAR | Status: DC | PRN

## 2019-11-02 MED ORDER — CEFAZOLIN SODIUM-DEXTROSE 2-4 GM/100ML-% IV SOLN
INTRAVENOUS | Status: AC
  Filled 2019-11-02: qty 100

## 2019-11-02 MED ORDER — POTASSIUM CHLORIDE CRYS ER 20 MEQ PO TBCR
40.0000 meq | EXTENDED_RELEASE_TABLET | Freq: Once | ORAL | Status: AC
  Administered 2019-11-02: 40 meq via ORAL
  Filled 2019-11-02: qty 2

## 2019-11-02 SURGICAL SUPPLY — 11 items
CATH PIG 70CM (CATHETERS) ×2 IMPLANT
CATH POWERPICC DL 5F 135CM GW (CATHETERS) ×2 IMPLANT
DEVICE STARCLOSE SE CLOSURE (Vascular Products) ×2 IMPLANT
GLIDEWIRE ADV .035X260CM (WIRE) ×2 IMPLANT
KIT FEMORAL DEL DENALI (Miscellaneous) ×2 IMPLANT
NEEDLE ENTRY 21GA 7CM ECHOTIP (NEEDLE) ×2 IMPLANT
PACK ANGIOGRAPHY (CUSTOM PROCEDURE TRAY) ×2 IMPLANT
SET INTRO CAPELLA COAXIAL (SET/KITS/TRAYS/PACK) ×2 IMPLANT
SHEATH BRITE TIP 5FRX11 (SHEATH) ×2 IMPLANT
TUBING CONTRAST HIGH PRESS 72 (TUBING) ×2 IMPLANT
WIRE J 3MM .035X145CM (WIRE) ×2 IMPLANT

## 2019-11-02 NOTE — Progress Notes (Signed)
ID  Got  IVC filter Pain left thigh much better Some numbness persist  BP 128/73   Pulse 79   Temp 98.6 F (37 C) (Oral)   Resp 20   Ht 5\' 11"  (1.803 m)   Wt 116.2 kg   SpO2 98%   BMI 35.73 kg/m     O/e Awake and alert RT PICC Rt leg dressing not removed     Labs CBC Latest Ref Rng & Units 11/02/2019 11/01/2019 10/31/2019  WBC 4.0 - 10.5 K/uL 10.6(H) 9.9 6.9  Hemoglobin 12.0 - 15.0 g/dL 10.2(L) 10.9(L) 11.2(L)  Hematocrit 36.0 - 46.0 % 32.5(L) 34.3(L) 34.9(L)  Platelets 150 - 400 K/uL 182 158 146(L)    CMP Latest Ref Rng & Units 11/02/2019 11/01/2019 10/31/2019  Glucose 70 - 99 mg/dL 89 107(H) 90  BUN 8 - 23 mg/dL 10 9 11   Creatinine 0.44 - 1.00 mg/dL 0.94 0.89 0.93  Sodium 135 - 145 mmol/L 138 138 138  Potassium 3.5 - 5.1 mmol/L 3.4(L) 3.8 3.5  Chloride 98 - 111 mmol/L 105 103 105  CO2 22 - 32 mmol/L 24 27 24   Calcium 8.9 - 10.3 mg/dL 8.7(L) 9.0 8.8(L)  Total Protein 6.5 - 8.1 g/dL - - -  Total Bilirubin 0.3 - 1.2 mg/dL - - -  Alkaline Phos 38 - 126 U/L - - -  AST 15 - 41 U/L - - -  ALT 0 - 44 U/L - - -    Micro BC 10/27/19 - GBS BC 2/13 NG 2/12 WC- GBS, acinetobacter  Antibiotics vanco 2/11>>2/12 Ceftriaxone 2/11>>2/12 Unasyn 2/12>>   Impression/Recommendation  Left iliopsoas hematoma- spontaneous bleeding- likely due to anticoagulation- conservative management Lovenox stopped   Rt DVT- got  IVC filter  Group B streptococcus bacteremia- source rt leg surgical wound- on unasyn  Non healing dehiscence of prior ORIF site- near rt ankle GBS and acinetobacter in the wound. 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 dont see any cultures done then  DM on insulin  CKD-but cr now is < 1  Discussed the management with the patient and her husband

## 2019-11-02 NOTE — Op Note (Signed)
[facility]  Percutaneous Study/Intervention Procedural Note   Date of Surgery: 11/02/2019,12:17 PM  Surgeon:Carlus Stay, Dolores Lory   Pre-operative Diagnosis: Atherosclerotic occlusive disease bilateral lower extremities with ulceration/nonhealing of right ankle incision  Post-operative diagnosis:  Same  Procedure(s) Performed:  1.  Abdominal aortogram  2.  Right lower extremity distal runoff third order catheter placement  3.  Star close left common femoral   Anesthesia: Conscious sedation was administered by the interventional radiology RN under my direct supervision. IV Versed plus fentanyl were utilized. Continuous ECG, pulse oximetry and blood pressure was monitored throughout the entire procedure.  Conscious sedation was administered for a total of 40 minutes.  Sheath: 5 French Pinnacle left common femoral retrograde  Contrast: 60 cc   Fluoroscopy Time: 54 minutes  Indications:  The patient presents to [facility] with ulceration that has been nonhealing.  Pedal pulses are nonpalpable bilaterally suggesting atherosclerotic occlusive disease.  The risks and benefits as well as alternative therapies for lower extremity revascularization are reviewed with the patient all questions are answered the patient agrees to proceed.  The patient is therefore undergoing angiography with the hope for intervention for limb salvage.   Procedure:  Becky Trevino a 74 y.o. female who was identified and appropriate procedural time out was performed.  The patient was then placed supine on the table and prepped and draped in the usual sterile fashion.  Ultrasound was used to evaluate the left common femoral artery.  It was echolucent and pulsatile indicating it is patent .  An ultrasound image was acquired for the permanent record.  A micropuncture needle was used to access the left common femoral artery under direct ultrasound guidance.  The microwire was then advanced  under fluoroscopic guidance without difficulty followed by the micro-sheath.  A 0.035 J wire was advanced without resistance and a 5Fr sheath was placed.    Pigtail catheter was then advanced to the level of T12 and AP projection of the aorta was obtained. Pigtail catheter was then repositioned to above the bifurcation and LAO view of the pelvis was obtained. Stiff angled Glidewire and pigtail catheter was then used across the bifurcation and the catheter was positioned in the distal external iliac artery.  RAO of the right groin was then obtained. Wire was reintroduced and negotiated into the SFA and the catheter was advanced into the SFA. Distal runoff was then performed.  After review of the images the catheter was removed over wire and an LAO view of the groin was obtained. StarClose device was deployed without difficulty.   Findings:   Aortogram: Abdominal aorta is opacified with a bolus injection contrast.  There is no evidence of hemodynamically significant disease there is minimal atherosclerotic changes.  The aortic bifurcation bilateral common iliac as well as the external iliac arteries are widely patent.  There is minimal atherosclerotic changes.  There are no hemodynamically significant lesions noted.  Right Lower Extremity: The right common femoral profunda femoris superficial femoral and popliteal are patent with mild atherosclerotic changes no hemodynamically significant stenoses.  Trifurcation is patent.  The anterior tibial is the dominant tibial to the foot and is widely patent filling the dorsalis pedis and the pedal arch.  The tibioperoneal trunk is patent as is the peroneal.  At the level of the ankle the peroneal has the typical collaterals but these are quite small and do not contribute significantly to the flow to the foot.  The posterior tibial is patent throughout its course  but is quite small by the time it reaches the ankle it contributes very little of the flow to the foot.   There is nonvisualization of the lateral and medial plantar arteries.  Left Lower Extremity: The visualized portions of the left common profunda femoris and superficial femoral arteries are widely patent.   Disposition: Patient was taken to the recovery room in stable condition having tolerated the procedure well.  Belenda Cruise Samiah Ricklefs 11/02/2019,12:17 PM

## 2019-11-02 NOTE — Progress Notes (Signed)
PHARMACY CONSULT NOTE FOR:  OUTPATIENT  PARENTERAL ANTIBIOTIC THERAPY (OPAT)  Indication: GBS bacteremia and right leg hardware infection, Acinetobacter wound infection Regimen: Unasyn 3 grams IV every 6 hours ( can be done as a 24 hour infusion if needed) End date: 11/25/2019  IV antibiotic discharge orders are pended. To discharging provider:  please sign these orders via discharge navigator,  Select New Orders & click on the button choice - Manage This Unsigned Work.     Thank you for allowing pharmacy to be a part of this patient's care.  Sherrell Weir A Deborah Lazcano 11/02/2019, 6:37 PM

## 2019-11-02 NOTE — Progress Notes (Signed)
PROGRESS NOTE    Becky Trevino  [id number] DOB: 03/19/1946 DOA: 10/27/2019  PCP: [name], MD    LOS - 6   Brief Narrative:  74 year old female with history of type 2 diabetes, hypertension, hyperlipidemia, degenerative disc disease, right ankle fracture in December status post ORIF on 12/18. Presented to the ED with confusion and shortness of breath on evening of 2/11. She had been discharged on Lovenox for DVT prophylaxis which had been completed. She has previously been on Xarelto for thrombophlebitis but was no longer taking this. She was found to have right lower extremity DVT and associated cellulitis. She was admitted and started on therapeutic Lovenox and IV antibiotics. Hematology consulted and agree with therapeutic dose of Lovenox well admitted and DOAC on discharge preferably Eliquis but patient would like to resume Xarelto until she sees her cardiologist. Patient has also had a wound associated with the ankle surgery, had wound VAC. Podiatry saw and evaluated patient and reviewed x-rays which showed no signs of osteomyelitis. Patient's blood cultures subsequently returned positive for group B streptococcus. Infectious disease consulted.On Unasyn.  2/15: severe left thigh and hip pain with thigh numbness.  Given prior left gluteal hematoma in November, obtained CT that showed a likely iliopsoas hematoma.  Lovenox stopped.   2/17: IVC filter placed & right lower extremity angiogram with stent placement  Subjective 2/17: Patient seen with husband at bedside after her procedures earlier this morning.  No acute events reported overnight.  Patient reports today IVC tube was placed and she got a standing right leg.  Denies any pain at this time.  The left hip is not currently bothering her, states she was given pain medicine downstairs.  Overall states feeling well.  No fevers chills, chest pain, shortness of breath, nausea vomiting diarrhea or other  complaints.  Assessment & Plan:   Principal Problem:   Bacteremia due to group B Streptococcus Active Problems:   Cellulitis of right lower extremity   Acute deep vein thrombosis (DVT) of right lower extremity (HCC)   Chronic kidney disease (CKD), stage III (moderate)   Type 2 diabetes mellitus with diabetic neuropathy, without long-term current use of insulin (HCC)   Left thigh/hip pain secondary to iliacis/iliopsoas hematoma-new. Patient did have a left gluteal hematoma a couple months ago which was evacuated by orthopedics (was on Xarelto). This time it developed while on full dose Lovenox for RLE DVT.   --Ortho, vascular surgery consulted --Lovenox for DVT stopped --IVC filter placed to prevent PE --Pain management --Continue PT as tolerated --Radiology recommended CT abd/pelvis to better visualize   Group B streptococcus bacteremia Right lower extremity cellulitis,mild, nonpurulent Suspected bacteremia secondary to cellulitis versus possible osteomyelitis associated with recent surgery and hardware,however overlying wound does not appear infected. --Continue Unasyn --ID following --Follow-up repeat blood cultures --Wound cultures negative to date,follow for final results --Will require a few weeks of IV antibiotic therapy  Right lower extremity DVT-provoked by recent right ankle surgery and secondary immobility.  Off anticoagulation due to probable hematoma.   --IVC filter placed 2/17 --Hematology is consulted, Dr. Tasia Catchings --Updated Dr. Tasia Catchings on 2/16 about new hematoma while on full dose Lovenox --Coagulopathy evaluation per hematology  Peripheral vascular disease Right lower extremity wound status post right ankle ORIF Wound does not appear infected. Please refer to Dr. Sherre Lain of 2/12 --Continues to be nonweightbearingon RLE --RLE angiogram 2/17, stent placed  History of right total knee arthroplasty with prosthetic joint infectionon suppressive  doxycycline due to retained hardware.Doxy  on hold on Unasyn.  Mild AKI superimposed on CKD stage IIIa-likely due to mild dehydration, prerenal azotemia. AKI improved with IV hydration. --Monitor BMP --Avoid renal toxic agents and renally dose meds as indicated   Type 2 diabetes-  --NovoLog 3 units 3 times daily with meals --Sliding scale NovoLog --Monitor blood glucose and titrate insulin as needed --Appears not to be on medications outpatient  Hyperlipidemia-continue atorvastatin    DVT prophylaxis:On full dose Lovenox Code Status: Full Code Family Communication:None at bedside  Disposition Plan:Pending further clinical improvement and negative repeat blood cultures, clearance by podiatry and ID.PT should reevaluate as current recommendations based on their evaluation prior to this left hip issue. Coming Fromhome Exp DC Date:2/18-19  Barriers:Repeat PT evaluation, consult clearance Medically Stable for Discharge?No  Consultants:  Infectious disease  Podiatry  Hematology  Vascular surgery  Orthopedics  Procedures:  None  Antimicrobials:  Rocephin 2/11 PM>>2/12  Unasyn 2/12>>>TBD   Objective: Vitals:   11/01/19 1146 11/01/19 2109 11/01/19 2259 11/02/19 0518  BP: 106/64 135/72 134/72 (!) 114/57  Pulse: 71 84 87 84  Resp: 18 16 16 20   Temp: 97.8 F (36.6 C) 98.6 F (37 C) 98.2 F (36.8 C) 98.3 F (36.8 C)  TempSrc: Oral Oral Oral Oral  SpO2: 91% 95% 91% 97%  Weight:      Height:        Intake/Output Summary (Last 24 hours) at 11/02/2019 0730 Last data filed at 11/02/2019 0518 Gross per 24 hour  Intake 240 ml  Output 650 ml  Net -410 ml   Filed Weights   10/27/19 2108 10/28/19 0053  Weight: 114.8 kg 116.2 kg    Examination:  General exam: awake, alert, no acute distress, obese HEENT: atraumatic, clear conjunctiva, anicteric sclera, moist mucus membranes, hearing grossly normal  Respiratory system: CTAB,  no wheezes, rales or rhonchi, normal respiratory effort. Cardiovascular system: normal S1/S2, RRR, stable lower extremity edema.   Central nervous system: A&O x4. no gross focal neurologic deficits, normal speech Extremities: moves all, right lower extremity dressing clean dry and intact Psychiatry: normal mood, congruent affect, judgement and insight appear normal    Data Reviewed: I have personally reviewed following labs and imaging studies  CBC: Recent Labs  Lab 10/27/19 2118 10/29/19 0527 10/30/19 0502 10/31/19 0526 11/01/19 0159  WBC 16.7* 9.2 7.3 6.9 9.9  NEUTROABS 15.3*  --   --   --   --   HGB 12.9 11.0* 11.0* 11.2* 10.9*  HCT 40.9 34.2* 34.7* 34.9* 34.3*  MCV 92.3 91.9 93.3 90.6 92.0  PLT 131* 98* 118* 146* 0000000   Basic Metabolic Panel: Recent Labs  Lab 10/27/19 2118 10/29/19 0527 10/30/19 0502 10/31/19 0526 11/01/19 0159  NA 134* 138 139 138 138  K 4.0 3.4* 3.9 3.5 3.8  CL 100 106 105 105 103  CO2 23 23 27 24 27   GLUCOSE 121* 88 90 90 107*  BUN 18 14 12 11 9   CREATININE 1.27* 1.04* 1.04* 0.93 0.89  CALCIUM 9.6 8.5* 8.8* 8.8* 9.0   GFR: Estimated Creatinine Clearance: 77.9 mL/min (by C-G formula based on SCr of 0.89 mg/dL). Liver Function Tests: Recent Labs  Lab 10/27/19 2118  AST 19  ALT 16  ALKPHOS 121  BILITOT 0.8  PROT 8.5*  ALBUMIN 3.9   No results for input(s): LIPASE, AMYLASE in the last 168 hours. No results for input(s): AMMONIA in the last 168 hours. Coagulation Profile: Recent Labs  Lab 10/28/19 0623  INR 1.2  Cardiac Enzymes: Recent Labs  Lab 11/01/19 0159 11/01/19 0630  CKTOTAL 541* 654*   BNP (last 3 results) No results for input(s): PROBNP in the last 8760 hours. HbA1C: No results for input(s): HGBA1C in the last 72 hours. CBG: Recent Labs  Lab 11/01/19 1200 11/01/19 1652 11/01/19 2017 11/01/19 2343 11/02/19 0331  GLUCAP 107* 113* 117* 116* 90   Lipid Profile: No results for input(s): CHOL, HDL, LDLCALC,  TRIG, CHOLHDL, LDLDIRECT in the last 72 hours. Thyroid Function Tests: No results for input(s): TSH, T4TOTAL, FREET4, T3FREE, THYROIDAB in the last 72 hours. Anemia Panel: No results for input(s): VITAMINB12, FOLATE, FERRITIN, TIBC, IRON, RETICCTPCT in the last 72 hours. Sepsis Labs: Recent Labs  Lab 10/27/19 2118 10/27/19 2323  LATICACIDVEN 1.8 1.7    Recent Results (from the past 240 hour(s))  Culture, blood (routine x 2)     Status: Abnormal   Collection Time: 10/27/19  9:18 PM   Specimen: BLOOD  Result Value Ref Range Status   Specimen Description   Final    BLOOD LEFT ANTECUBITAL Performed at [facility], [address] [place], [place] [address]    Special Requests   Final    BOTTLES DRAWN AEROBIC AND ANAEROBIC Blood Culture adequate volume Performed at [facility], [address], [place], [place] [address]    Culture  Setup Time   Final    GRAM POSITIVE COCCI IN BOTH AEROBIC AND ANAEROBIC BOTTLES CRITICAL RESULT CALLED TO, READ BACK BY AND VERIFIED WITH: KRISTEN MERRILL AT 1005 10/28/19.PMF Performed at [facility], [place] [address], [place], [place] [address]    Culture GROUP B STREP(S.AGALACTIAE)ISOLATED (A)  Final   Report Status 10/30/2019 FINAL  Final   Organism ID, Bacteria GROUP B STREP(S.AGALACTIAE)ISOLATED  Final      Susceptibility   Group b strep(s.agalactiae)isolated - MIC*    CLINDAMYCIN <=0.25 SENSITIVE Sensitive     AMPICILLIN <=0.25 SENSITIVE Sensitive     ERYTHROMYCIN <=0.12 SENSITIVE Sensitive     VANCOMYCIN 0.5 SENSITIVE Sensitive     CEFTRIAXONE <=0.12 SENSITIVE Sensitive     LEVOFLOXACIN 1 SENSITIVE Sensitive     * GROUP B STREP(S.AGALACTIAE)ISOLATED  Blood Culture ID Panel (Reflexed)     Status: Abnormal   Collection Time: 10/27/19  9:18 PM  Result Value Ref Range Status   Enterococcus species NOT DETECTED NOT DETECTED Final   Listeria monocytogenes NOT DETECTED NOT DETECTED Final   Staphylococcus species NOT  DETECTED NOT DETECTED Final   Staphylococcus aureus (BCID) NOT DETECTED NOT DETECTED Final   Streptococcus species DETECTED (A) NOT DETECTED Final    Comment: CRITICAL RESULT CALLED TO, READ BACK BY AND VERIFIED WITH: KRISTEN MERRILL AT 1005 10/28/19.PMF    Streptococcus agalactiae DETECTED (A) NOT DETECTED Final    Comment: CRITICAL RESULT CALLED TO, READ BACK BY AND VERIFIED WITH: KRISTEN MERRILL AT 1005 10/28/19.PMF    Streptococcus pneumoniae NOT DETECTED NOT DETECTED Final   Streptococcus pyogenes NOT DETECTED NOT DETECTED Final   Acinetobacter baumannii NOT DETECTED NOT DETECTED Final   Enterobacteriaceae species NOT DETECTED NOT DETECTED Final   Enterobacter cloacae complex NOT DETECTED NOT DETECTED Final   Escherichia coli NOT DETECTED NOT DETECTED Final   Klebsiella oxytoca NOT DETECTED NOT DETECTED Final   Klebsiella pneumoniae NOT DETECTED NOT DETECTED Final   Proteus species NOT DETECTED NOT DETECTED Final   Serratia marcescens NOT DETECTED NOT DETECTED Final   Haemophilus influenzae NOT DETECTED NOT DETECTED Final   Neisseria meningitidis  NOT DETECTED NOT DETECTED Final   Pseudomonas aeruginosa NOT DETECTED NOT DETECTED Final   Candida albicans NOT DETECTED NOT DETECTED Final   Candida glabrata NOT DETECTED NOT DETECTED Final   Candida krusei NOT DETECTED NOT DETECTED Final   Candida parapsilosis NOT DETECTED NOT DETECTED Final   Candida tropicalis NOT DETECTED NOT DETECTED Final    Comment: Performed at [facility], [address] [place], [place] [address]  Culture, blood (routine x 2)     Status: Abnormal   Collection Time: 10/27/19  9:23 PM   Specimen: BLOOD  Result Value Ref Range Status   Specimen Description   Final    BLOOD BLOOD LEFT HAND Performed at [facility], [address], [place], [place] [address]    Special Requests   Final    BOTTLES DRAWN AEROBIC AND ANAEROBIC Blood Culture adequate volume Performed at [facility], [address] [place], [place] [address]    Culture  Setup Time   Final    GRAM POSITIVE COCCI IN BOTH AEROBIC AND ANAEROBIC BOTTLES CRITICAL RESULT CALLED TO, READ BACK BY AND VERIFIED WITH: KRISTEN MERRILL AT 1005 10/28/19.PMF Performed at [facility], [address] [place], [place] [address]    Culture (A)  Final    STREPTOCOCCUS AGALACTIAE SUSCEPTIBILITIES PERFORMED ON PREVIOUS CULTURE WITHIN THE LAST 5 DAYS. Performed at [facility], [place] [address], [place], [place] [address]    Report Status 10/30/2019 FINAL  Final  SARS CORONAVIRUS 2 (TAT 6-24 HRS) Nasopharyngeal Nasopharyngeal Swab     Status: None   Collection Time: 10/27/19 11:07 PM   Specimen: Nasopharyngeal Swab  Result Value Ref Range Status   SARS Coronavirus 2 NEGATIVE NEGATIVE Final    Comment: (NOTE) SARS-CoV-2 target nucleic acids are NOT DETECTED. The SARS-CoV-2 RNA is generally detectable in upper and lower respiratory specimens during the acute phase of infection. Negative results do not preclude SARS-CoV-2 infection, do not rule out co-infections with other pathogens, and should not be used as the sole basis for treatment or other patient management decisions. Negative results must be combined with clinical observations, patient history, and epidemiological information. The expected result is Negative. Fact Sheet for Patients: SugarRoll.be Fact Sheet for Healthcare Providers: https://www.woods-mathews.com/ This test is not yet approved or cleared by the [place] FDA and  has been authorized for detection and/or diagnosis of SARS-CoV-2 by FDA under an Emergency Use Authorization (EUA). This EUA will remain  in effect (meaning this test can be used) for the duration of the COVID-19 declaration under Section 56 4(b)(1) of the Act, 21 U.S.C. section 360bbb-3(b)(1), unless the authorization is terminated or revoked sooner. Performed at  [facility], [place] [address] [place], [place] [address]   Aerobic/Anaerobic Culture (surgical/deep wound)     Status: None   Collection Time: 10/28/19 12:30 PM   Specimen: Wound  Result Value Ref Range Status   Specimen Description   Final    WOUND LEG RIGHT Performed at [facility], [address] [place], [place] [address]    Special Requests   Final    Normal Performed at [facility], [place], [place] [address]    Gram Stain   Final    NO WBC SEEN MODERATE GRAM POSITIVE COCCI MODERATE GRAM POSITIVE RODS    Culture   Final    FEW STREPTOCOCCUS AGALACTIAE TESTING AGAINST S. AGALACTIAE NOT ROUTINELY PERFORMED DUE TO PREDICTABILITY OF AMP/PEN/VAN SUSCEPTIBILITY.  RARE ACINETOBACTER CALCOACETICUS/BAUMANNII COMPLEX NO ANAEROBES ISOLATED Performed at [facility], [place] [address] [place], [place] [address]    Report Status 11/01/2019 FINAL  Final   Organism ID, Bacteria ACINETOBACTER CALCOACETICUS/BAUMANNII COMPLEX  Final      Susceptibility   Acinetobacter calcoaceticus/baumannii complex - MIC*    CEFTAZIDIME 4 SENSITIVE Sensitive     CIPROFLOXACIN >=4 RESISTANT Resistant     GENTAMICIN 2 SENSITIVE Sensitive     IMIPENEM <=0.25 SENSITIVE Sensitive     PIP/TAZO <=4 SENSITIVE Sensitive     TRIMETH/SULFA <=20 SENSITIVE Sensitive     AMPICILLIN/SULBACTAM <=2 SENSITIVE Sensitive     * RARE ACINETOBACTER CALCOACETICUS/BAUMANNII COMPLEX  CULTURE, BLOOD (ROUTINE X 2) w Reflex to ID Panel     Status: None (Preliminary result)   Collection Time: 10/29/19  1:44 PM   Specimen: BLOOD  Result Value Ref Range Status   Specimen Description BLOOD [facility]  Final   Special Requests   Final    BOTTLES DRAWN AEROBIC AND ANAEROBIC Blood Culture adequate volume   Culture   Final    NO GROWTH 4 DAYS Performed at [facility], [address] [place], [place] [address]    Report Status PENDING  Incomplete  CULTURE, BLOOD (ROUTINE X 2) w  Reflex to ID Panel     Status: None (Preliminary result)   Collection Time: 10/29/19  1:46 PM   Specimen: BLOOD  Result Value Ref Range Status   Specimen Description BLOOD  LAC  Final   Special Requests   Final    BOTTLES DRAWN AEROBIC AND ANAEROBIC Blood Culture adequate volume   Culture   Final    NO GROWTH 4 DAYS Performed at [facility], [address] [place], [place] [address]    Report Status PENDING  Incomplete         Radiology Studies: DG Lumbar Spine 2-3 Views  Result Date: 10/31/2019 CLINICAL DATA:  Left thigh pain EXAM: LUMBAR SPINE - 2-3 VIEW COMPARISON:  None. FINDINGS: Five lumbar type vertebral bodies are well visualized. Vertebral body height is well maintained. Degenerative anterolisthesis of L4 on L5 is noted. Facet hypertrophic changes are seen. Changes of prior left hip replacement are noted. Degenerative changes of the right hip are seen. Aortic calcifications are noted. IMPRESSION: Degenerative change with anterolisthesis of L4 on L5. Electronically Signed   By: Inez Catalina M.D.   On: 10/31/2019 16:41   CT ABDOMEN PELVIS W CONTRAST  Result Date: 11/01/2019 CLINICAL DATA:  Hip pain, abscess suspected EXAM: CT ABDOMEN AND PELVIS WITH CONTRAST TECHNIQUE: Multidetector CT imaging of the abdomen and pelvis was performed using the standard protocol following bolus administration of intravenous contrast. CONTRAST:  163mL OMNIPAQUE IOHEXOL 300 MG/ML  SOLN COMPARISON:  Left hip, 1,021 abdomen pelvis, 08/07/2019 FINDINGS: Lower chest: No acute abnormality. Dependent bibasilar scarring and/or atelectasis. Left coronary artery calcifications. Hepatobiliary: No solid liver abnormality is seen. Gallstones and/or sludge in the gallbladder. No gallbladder wall thickening, or biliary dilatation. Pancreas: Unremarkable. No pancreatic ductal dilatation or surrounding inflammatory changes. Spleen: Normal in size without significant abnormality. Adrenals/Urinary Tract:  Adrenal glands are unremarkable. Kidneys are normal, without renal calculi, solid lesion, or hydronephrosis. Bladder is unremarkable. Stomach/Bowel: Stomach is within normal limits. Incidental duodenal diverticulum. Appendix appears normal. No evidence of bowel wall thickening, distention, or inflammatory changes. Vascular/Lymphatic: Aortic atherosclerosis. No enlarged abdominal or pelvic lymph nodes. Reproductive: No mass or other significant abnormality. Other: No abdominal wall hernia or abnormality. No abdominopelvic  ascites. Musculoskeletal: Status post left hip total arthroplasty. Severe arthrosis and subchondral cyst formation of the right hip. There is a large hematoma within the left iliacus muscle body and conjoint tendon of the iliopsoas, measuring at least 6.8 x 3.7 x 13.8 cm (series 3, image 64, series 6, image 119). Assessment of the inferior portion is difficult due to dense streak artifact from adjacent hip arthroplasty. There is a superficial fluid collection overlying the left gluteus musculature, which is substantially decreased in size compared to prior CT dated 08/07/2019, measuring 5.0 x 3.3 cm (series 2, image 5). IMPRESSION: 1. Large hematoma within the left iliacus muscle body and conjoint tendon of the iliopsoas, measuring at least 6.8 x 3.7 x 13.8 cm. Assessment of the inferior portion is difficult due to dense streak artifact from adjacent hip arthroplasty. There is no obvious tendon retraction or associated fracture. 2. Superficial fluid collection overlying the left gluteus musculature is substantially decreased in size compared to prior CT dated 08/07/2019, measuring 5.0 x 3.3 cm, consistent with residual hematoma or seroma. 3. Cholelithiasis without evidence of cholecystitis. 4. Coronary artery disease.  Aortic Atherosclerosis (ICD10-I70.0). Electronically Signed   By: Eddie Candle M.D.   On: 11/01/2019 17:13   CT FEMUR LEFT W CONTRAST  Result Date: 10/31/2019 CLINICAL DATA:   Acute onset of left thigh pain. Remote left hip arthroplasty. EXAM: CT OF THE LOWER RIGHT EXTREMITY WITH CONTRAST TECHNIQUE: Multidetector CT imaging of the lower right extremity was performed according to the standard protocol following intravenous contrast administration. COMPARISON:  Radiographs, same date. CONTRAST:  151mL OMNIPAQUE IOHEXOL 300 MG/ML  SOLN FINDINGS: The left hip prosthesis is intact. No findings suspicious for periprosthetic fracture or loosening. No dislocation. The left femur is intact. A total left knee arthroplasty is noted. No obvious complicating features. Marked enlargement of the left iliopsoas muscle suggesting hematoma, possible spontaneous bleed or tear, if there was trauma. This is likely causing the patient's left hip pain. There is also some subcutaneous edema or hemorrhage in the lateral hip area. No significant intrapelvic abnormalities are identified. The pubic symphysis and pubic bones are intact. Advanced degenerative changes are noted involving the right hip. IMPRESSION: 1. Marked enlargement of the left iliopsoas muscle suggesting hematoma, possible spontaneous bleed or tear, if there was trauma. 2. Intact left hip prosthesis and no findings suspicious for periprosthetic fracture or loosening. 3. No significant intrapelvic abnormalities. 4. Advanced degenerative changes involving the right hip. Electronically Signed   By: Marijo Sanes M.D.   On: 10/31/2019 16:46   DG HIP UNILAT WITH PELVIS 2-3 VIEWS LEFT  Result Date: 10/31/2019 CLINICAL DATA:  Left thigh pain EXAM: DG HIP (WITH OR WITHOUT PELVIS) 2-3V LEFT COMPARISON:  08/07/2019 FINDINGS: Pelvic ring is intact. Changes of prior left hip replacement are noted. Significant degenerative changes in the right hip are seen. No soft tissue abnormality is seen. IMPRESSION: Chronic changes without acute abnormality. Electronically Signed   By: Inez Catalina M.D.   On: 10/31/2019 16:42        Scheduled Meds: .  brimonidine  1 drop Both Eyes TID   And  . brinzolamide  1 drop Both Eyes TID  . calcium-vitamin D  1 tablet Oral BID  . fluticasone  2 spray Each Nare Daily  . imipramine  25 mg Oral QHS  . magnesium oxide  400 mg Oral Daily  . metoprolol succinate  100 mg Oral QHS  . midodrine  5 mg Oral TID PC  . mirabegron  ER  25 mg Oral Daily   Continuous Infusions: . sodium chloride 100 mL/hr at 11/01/19 2339  . ampicillin-sulbactam (UNASYN) IV 3 g (11/02/19 0505)     LOS: 6 days    Time spent: 35 minutes    Ezekiel Slocumb, DO [facility]   If 7PM-7AM, please contact night-coverage [email or web address] 11/02/2019, 7:30 AM

## 2019-11-02 NOTE — Progress Notes (Addendum)
Unable to change dressing today as patient is off floor for vascular procedure.  Will have nursing staff change dressing as instructed.

## 2019-11-02 NOTE — Progress Notes (Signed)
PT Cancellation Note  Patient Details Name: Becky Trevino MRN: [id number] DOB: 1946/09/06   Cancelled Treatment:    Reason Eval/Treat Not Completed: Patient at procedure or test/unavailable   Pt awaiting transport to procedure.  Will continue as appropriate.     Chesley Noon 11/02/2019, 10:39 AM

## 2019-11-02 NOTE — Op Note (Signed)
[facility]   OPERATIVE NOTE    PRE-OPERATIVE DIAGNOSIS: DVT of the right popliteal associated with spontaneous hematoma of the left psoas muscle  POST-OPERATIVE DIAGNOSIS: Same  PROCEDURE: 1.   Ultrasound guidance for vascular access to the left common femoral vein 2.   Catheter placement into the inferior vena cava 3.   Inferior venacavogram 4.   Placement of a [place] IVC filter  SURGEON: Hortencia Pilar  ASSISTANT(S): None  ANESTHESIA: Conscious sedation was administered by the interventional radiology RN under my direct supervision. IV Versed plus fentanyl were utilized. Continuous ECG, pulse oximetry and blood pressure was monitored throughout the entire procedure. Conscious sedation was for a total of 20 minutes.  ESTIMATED BLOOD LOSS: minimal  FINDING(S): 1.  Patent IVC  SPECIMEN(S):  none  INDICATIONS:   Becky Trevino is a 74 y.o. y.o. female who presents with a documented right lower extremity DVT recently noted abrupt onset of left hip and thigh pain.  Work-up has demonstrated worsening of a hematoma of the left psoas muscle.  This necessitated stopping anticoagulation.  Inferior vena cava filter is indicated for this reason.  Risks and benefits including filter thrombosis, migration, fracture, bleeding, and infection were all discussed.  We discussed that all IVC filters that we place can be removed if desired from the patient once the need for the filter has passed.    DESCRIPTION: After obtaining full informed written consent, the patient was brought back to the vascular suite. The skin was sterilely prepped and draped in a sterile surgical field was created. Ultrasound was placed in a sterile sleeve. The left common femoral was echolucent and compressible indicating patency. Image was recorded for the permanent record. The puncture was made under continuous real-time ultrasound guidance.  The left common femoral was accessed under direct  ultrasound guidance without difficulty with a micropuncture needle. Microwire was then advanced under fluoroscopic guidance without difficulty. Micro-sheath was then inserted and a J-wire was then placed. The dilator is passed over the wire and the delivery sheath was placed into the inferior vena cava.  Inferior venacavogram was performed. This demonstrated a patent IVC with the level of the renal veins at L2.  The filter was then deployed into the inferior vena cava at the level of L3 just below the renal veins. The delivery sheath was then removed. Pressure was held. Sterile dressings were placed. The patient tolerated the procedure well and was taken to the recovery room in stable condition.  Interpretation: Inferior vena cava is opacified with a bolus injection contrast.  Bilateral renal blushes are noted at the L1-L2 disc space.  IVC filter is deployed in excellent orientation at the L2-L3 level.  COMPLICATIONS: None  CONDITION: Stable  Hortencia Pilar  11/02/2019, 12:11 PM

## 2019-11-02 NOTE — Progress Notes (Signed)
Hematology/Oncology Progress Note [facility] Telephone:([phone] [phone]  Patient Care Team: [name], MD as PCP - General (Family Medicine) Yolonda Kida, MD as Consulting Physician (Cardiology) Albertine Patricia, DPM as Consulting Physician (Podiatry) Lorelee Cover., MD as Consulting Physician (Ophthalmology) Hessie Knows, MD as Consulting Physician (Orthopedic Surgery) Murrell Redden, MD as Consulting Physician (Urology) Cira Servant, DO as Consulting Physician (Rehabilitation) [name], MD as Consulting Physician (Infectious Diseases) Neldon Labella, RN as Case Manager   Name of the patient: Becky Trevino  SR:[id number]  04-Nov-1945  Date of visit: 11/02/19   INTERVAL HISTORY-  10/27/2019 blood culture was positive for Streptococcus agalactiae. Patient reports third severe anterior left thigh pain on 10/31/2019.  No source of injury or trauma. 10/31/2019 CT femur left with contrast  showed marked enlargement of the left iliopsoas muscle suggesting hematoma. Lovenox was stopped.   11/01/2019 showed large hematoma within the left iliacs muscle body and conjoined tendon of the iliopsoas measuring at least 6.8 x 3.7 x 13.8 cm.  Superficial fluid collection overlying the left gluteus is decreased in size compared to prior CT November 2020.  Consistent with residual hematoma or seroma.  Cholelithiasis Patient is status post IVC filter placement to prevent pulmonary embolism.   Today patient reports feeling well.  Pain is well controlled.  Review of systems-  Pain in the left thigh is much better.  Some numbness  Allergies  Allergen Reactions  . Lisinopril Swelling    Patient Active Problem List   Diagnosis Date Noted  . Bacteremia due to group B Streptococcus 10/30/2019  . Acute deep vein thrombosis (DVT) of right lower extremity ([place]) 10/30/2019  . Cellulitis of right lower extremity 10/27/2019  . Hematoma   .  Syncope 08/07/2019  . Anemia 08/07/2019  . Chronic infection of right knee ([place]) 11/25/2018  . Infection and inflammatory reaction due to internal joint prosthesis, subsequent encounter 01/25/2018  . Infection of total right knee replacement ([place]) 11/19/2017  . Type 2 diabetes mellitus with diabetic neuropathy, without long-term current use of insulin ([place]) 11/06/2016  . Primary osteoarthritis of knee 03/11/2016  . Primary osteoarthritis of left hip 12/11/2015  . Benign essential HTN 04/21/2015  . Edema leg 04/21/2015  . Chronic kidney disease (CKD), stage III (moderate) 04/21/2015  . Chronic venous insufficiency 04/21/2015  . DDD (degenerative disc disease), cervical 04/21/2015  . Diabetes mellitus with renal manifestation ([place]) 04/21/2015  . Dyslipidemia 04/21/2015  . Bladder cystocele 04/21/2015  . Urinary incontinence 04/21/2015  . Leg varices 04/21/2015  . Insomnia 04/21/2015  . Eczema intertrigo 04/21/2015  . Obstructive apnea 04/21/2015  . Menopausal and perimenopausal disorder 04/21/2015  . Supraventricular premature beats 04/21/2015  . Osteoarthritis of multiple joints 04/21/2015  . H/O Spinal surgery 11/14/2013  . Detrusor muscle hypertonia 11/07/2013     Past Medical History:  Diagnosis Date  . Cervical spondylosis   . Chronic kidney disease    Stage 3  . DDD (degenerative disc disease), cervical    [facility]  . Diabetes mellitus without complication ([place])   . Diverticulosis   . Hyperlipidemia   . Hypertension   . Intertrigo   . Leg cramps   . Leg varices   . Obesity   . OSA (obstructive sleep apnea)   . Symptomatic menopausal or female climacteric states   . Syncope      Past Surgical History:  Procedure Laterality Date  . ABDOMINAL HYSTERECTOMY  1993   Total  .  BUNIONECTOMY Bilateral 1993  . I & D KNEE WITH POLY EXCHANGE Right 11/19/2017   Procedure: RIGHT KNEE POLY EXCHANGE WITH IRRIGATION AND DEBRIDEMENT;  Surgeon: Hessie Knows, MD;   Location: [facility];  Service: Orthopedics;  Laterality: Right;  . JOINT REPLACEMENT     bilateral knee  . LAMINECTOMY  11/14/2013    Cervical Fusion , [facility], Dr. Delilah Shan  . LASER ABLATION Bilateral 07/29/2012   Dr. Lucky Cowboy  . LOWER EXTREMITY ANGIOGRAPHY Right 11/02/2019   Procedure: Lower Extremity Angiography;  Surgeon: Katha Cabal, MD;  Location: [facility];  Service: Cardiovascular;  Laterality: Right;  . ORIF ANKLE FRACTURE Right 09/02/2019   Procedure: OPEN REDUCTION INTERNAL FIXATION (ORIF) ANKLE FRACTURE BIMALLEOLAR;  Surgeon: [name], DPM;  Location: [facility];  Service: Podiatry;  Laterality: Right;  . REPLACEMENT TOTAL KNEE Left 03/2010   Dr. Rudene Christians  . TOTAL HIP ARTHROPLASTY Left 12/11/2015   Procedure: TOTAL HIP ARTHROPLASTY ANTERIOR APPROACH;  Surgeon: Hessie Knows, MD;  Location: [facility];  Service: Orthopedics;  Laterality: Left;  . TOTAL KNEE ARTHROPLASTY Right 03/11/2016   Procedure: TOTAL KNEE ARTHROPLASTY;  Surgeon: Hessie Knows, MD;  Location: [facility];  Service: Orthopedics;  Laterality: Right;    Social History   Socioeconomic History  . Marital status: Married    Spouse name: Not on file  . Number of children: 1  . Years of education: Not on file  . Highest education level: Not on file  Occupational History  . Not on file  Tobacco Use  . Smoking status: Never Smoker  . Smokeless tobacco: Never Used  Substance and Sexual Activity  . Alcohol use: No    Alcohol/week: 0.0 standard drinks  . Drug use: No  . Sexual activity: Yes    Partners: Male  Other Topics Concern  . Not on file  Social History Narrative   Married, one adopted grown child    Social Determinants of Health   Financial Resource Strain: Low Risk   . Difficulty of Paying Living Expenses: Not hard at all  Food Insecurity: No Food Insecurity  . Worried About Charity fundraiser in the Last Year: Never true  . Ran Out of Food in the Last Year: Never true  Transportation Needs: No  Transportation Needs  . Lack of Transportation (Medical): No  . Lack of Transportation (Non-Medical): No  Physical Activity: Insufficiently Active  . Days of Exercise per Week: 3 days  . Minutes of Exercise per Session: 30 min  Stress: No Stress Concern Present  . Feeling of Stress : Not at all  Social Connections: Slightly Isolated  . Frequency of Communication with Friends and Family: More than three times a week  . Frequency of Social Gatherings with Friends and Family: More than three times a week  . Attends Religious Services: More than 4 times per year  . Active Member of Clubs or Organizations: No  . Attends Archivist Meetings: Never  . Marital Status: Married  Human resources officer Violence: Not At Risk  . Fear of Current or Ex-Partner: No  . Emotionally Abused: No  . Physically Abused: No  . Sexually Abused: No     Family History  Problem Relation Age of Onset  . Diabetes Mother   . Hyperlipidemia Mother   . Hypertension Mother   . Diabetes Father   . Hyperlipidemia Father   . Hypertension Father   . Obesity Father   . Hypertension Sister   . Hyperlipidemia Sister   .  Hyperlipidemia Sister   . Breast cancer Neg Hx      Current Facility-Administered Medications:  .  0.9 %  sodium chloride infusion, , Intravenous, Continuous, Schnier, Dolores Lory, MD, Last Rate: 100 mL/hr at 11/02/19 1500, Rate Verify at 11/02/19 1500 .  acetaminophen (TYLENOL) tablet 650 mg, 650 mg, Oral, Q6H PRN, 650 mg at 10/31/19 1027 **OR** acetaminophen (TYLENOL) suppository 650 mg, 650 mg, Rectal, Q6H PRN, Schnier, Dolores Lory, MD .  Ampicillin-Sulbactam (UNASYN) 3 g in sodium chloride 0.9 % 100 mL IVPB, 3 g, Intravenous, Q6H, Schnier, Dolores Lory, MD, Last Rate: 200 mL/hr at 11/02/19 1718, 3 g at 11/02/19 1718 .  brimonidine (ALPHAGAN) 0.2 % ophthalmic solution 1 drop, 1 drop, Both Eyes, TID, 1 drop at 11/02/19 1536 **AND** brinzolamide (AZOPT) 1 % ophthalmic suspension 1 drop, 1 drop, Both  Eyes, TID, Schnier, Dolores Lory, MD, 1 drop at 11/02/19 1535 .  calcium-vitamin D (OSCAL WITH D) 500-200 MG-UNIT per tablet 1 tablet, 1 tablet, Oral, BID, Schnier, Dolores Lory, MD, 1 tablet at 11/02/19 0850 .  Chlorhexidine Gluconate Cloth 2 % PADS 6 each, 6 each, Topical, Daily, Ezekiel Slocumb, DO, 6 each at 11/02/19 1536 .  fluticasone (FLONASE) 50 MCG/ACT nasal spray 2 spray, 2 spray, Each Nare, Daily, Schnier, Dolores Lory, MD, 2 spray at 11/02/19 [phone] .  HYDROcodone-acetaminophen (NORCO/VICODIN) 5-325 MG per tablet 1 tablet, 1 tablet, Oral, Q6H PRN, Schnier, Dolores Lory, MD, 1 tablet at 11/02/19 1708 .  imipramine (TOFRANIL) tablet 25 mg, 25 mg, Oral, QHS, Schnier, Dolores Lory, MD .  lip balm (BLISTEX) ointment 1 application, 1 application, Topical, PRN, Schnier, Dolores Lory, MD .  magnesium hydroxide (MILK OF MAGNESIA) suspension 30 mL, 30 mL, Oral, Daily PRN, Schnier, Dolores Lory, MD .  magnesium oxide (MAG-OX) tablet 400 mg, 400 mg, Oral, Daily, Schnier, Dolores Lory, MD, 400 mg at 11/02/19 0850 .  metoprolol succinate (TOPROL-XL) 24 hr tablet 100 mg, 100 mg, Oral, QHS, Schnier, Dolores Lory, MD, 100 mg at 11/01/19 2111 .  midodrine (PROAMATINE) tablet 5 mg, 5 mg, Oral, TID PC, Schnier, Dolores Lory, MD, 5 mg at 11/02/19 1710 .  mirabegron ER (MYRBETRIQ) tablet 25 mg, 25 mg, Oral, Daily, Schnier, Dolores Lory, MD, 25 mg at 11/02/19 0848 .  morphine 4 MG/ML injection 4 mg, 4 mg, Intravenous, Q3H PRN, [name], Dolores Lory, MD, 4 mg at 11/02/19 0853 .  ondansetron (ZOFRAN) tablet 4 mg, 4 mg, Oral, Q6H PRN **OR** ondansetron (ZOFRAN) injection 4 mg, 4 mg, Intravenous, Q6H PRN, Schnier, Dolores Lory, MD .  ondansetron (ZOFRAN) injection 4 mg, 4 mg, Intravenous, Q6H PRN, Schnier, Dolores Lory, MD .  ondansetron (ZOFRAN) injection 4 mg, 4 mg, Intravenous, Q6H PRN, Schnier, Dolores Lory, MD .  oxyCODONE (Oxy IR/ROXICODONE) immediate release tablet 10 mg, 10 mg, Oral, Q4H PRN, Schnier, Dolores Lory, MD, 10 mg at 11/01/19 2111 .   traZODone (DESYREL) tablet 25 mg, 25 mg, Oral, QHS PRN, Katha Cabal, MD   Physical exam:  Vitals:   11/02/19 1245 11/02/19 1305 11/02/19 1344 11/02/19 1452  BP: (!) 146/83 (!) 153/83 135/74 127/66  Pulse: 81 76 77 85  Resp: 16 18 16    Temp:  98.1 F (36.7 C) 98.4 F (36.9 C) 98.1 F (36.7 C)  TempSrc:  Oral Oral Oral  SpO2: 98% 95% 97% 98%  Weight:      Height:       Physical Exam  Constitutional: She is oriented to person, place, and time. No  distress.  HENT:  Head: Normocephalic and atraumatic.  Mouth/Throat: No oropharyngeal exudate.  Eyes: Pupils are equal, round, and reactive to light. EOM are normal. No scleral icterus.  Cardiovascular: Normal rate and regular rhythm.  No murmur heard. Pulmonary/Chest: Effort normal. No respiratory distress. She has no rales. She exhibits no tenderness.  Abdominal: Soft. There is no abdominal tenderness.  Musculoskeletal:        General: No edema.     Cervical back: Normal range of motion and neck supple.     Comments: Right lower extremity dressing dry and clean.  Neurological: She is alert and oriented to person, place, and time.  Skin: Skin is warm and dry. She is not diaphoretic. No erythema.  Psychiatric: Affect normal.       CMP Latest Ref Rng & Units 11/02/2019  Glucose 70 - 99 mg/dL 89  BUN 8 - 23 mg/dL 10  Creatinine 0.44 - 1.00 mg/dL 0.94  Sodium 135 - 145 mmol/L 138  Potassium 3.5 - 5.1 mmol/L 3.4(L)  Chloride 98 - 111 mmol/L 105  CO2 22 - 32 mmol/L 24  Calcium 8.9 - 10.3 mg/dL 8.7(L)  Total Protein 6.5 - 8.1 g/dL -  Total Bilirubin 0.3 - 1.2 mg/dL -  Alkaline Phos 38 - 126 U/L -  AST 15 - 41 U/L -  ALT 0 - 44 U/L -   CBC Latest Ref Rng & Units 11/02/2019  WBC 4.0 - 10.5 K/uL 10.6(H)  Hemoglobin 12.0 - 15.0 g/dL 10.2(L)  Hematocrit 36.0 - 46.0 % 32.5(L)  Platelets 150 - 400 K/uL 182   RADIOGRAPHIC STUDIES: I have personally reviewed the radiological images as listed and agreed with the findings in the  report. DG Lumbar Spine 2-3 Views  Result Date: 10/31/2019 CLINICAL DATA:  Left thigh pain EXAM: LUMBAR SPINE - 2-3 VIEW COMPARISON:  None. FINDINGS: Five lumbar type vertebral bodies are well visualized. Vertebral body height is well maintained. Degenerative anterolisthesis of L4 on L5 is noted. Facet hypertrophic changes are seen. Changes of prior left hip replacement are noted. Degenerative changes of the right hip are seen. Aortic calcifications are noted. IMPRESSION: Degenerative change with anterolisthesis of L4 on L5. Electronically Signed   By: Inez Catalina M.D.   On: 10/31/2019 16:41   DG Ankle 2 Views Right  Result Date: 10/28/2019 CLINICAL DATA:  The patient suffered a distal fibular fracture due to a fall in December, 2020. The patient underwent ORIF 09/02/2019 and now has developed swelling, warmth and tenderness about the surgical site. EXAM: RIGHT ANKLE - 2 VIEW COMPARISON:  Intraoperative imaging for fracture fixation 09/02/2019. FINDINGS: Plate and screw fixation of a distal fibular fracture is identified. Position and alignment are anatomic. Subtle lucency about fixation screws is most notable about the 3rd-6th screws from the top and worrisome for infection or loosening. Soft tissues about the ankle are swollen. No soft tissue gas is identified. Vac drain is in place. IMPRESSION: Status post fixation of a distal fibular fracture. There is subtle lucency about multiple fixation screws worrisome for loosening or infection. Soft tissue swelling about the ankle without soft tissue gas. Electronically Signed   By: Inge Rise M.D.   On: 10/28/2019 09:29   CT Head Wo Contrast  Result Date: 10/27/2019 CLINICAL DATA:  Encephalopathy EXAM: CT HEAD WITHOUT CONTRAST TECHNIQUE: Contiguous axial images were obtained from the base of the skull through the vertex without intravenous contrast. COMPARISON:  Head CT 08/07/2019 FINDINGS: Brain: There is no mass, hemorrhage or  extra-axial collection.  The size and configuration of the ventricles and extra-axial CSF spaces are normal. The brain parenchyma is normal, without acute or chronic infarction. Vascular: No abnormal hyperdensity of the major intracranial arteries or dural venous sinuses. No intracranial atherosclerosis. Skull: Craniocervical fusion. Sinuses/Orbits: No fluid levels or advanced mucosal thickening of the visualized paranasal sinuses. No mastoid or middle ear effusion. The orbits are normal. IMPRESSION: Normal brain. Electronically Signed   By: Ulyses Jarred M.D.   On: 10/27/2019 22:18   CT ABDOMEN PELVIS W CONTRAST  Result Date: 11/01/2019 CLINICAL DATA:  Hip pain, abscess suspected EXAM: CT ABDOMEN AND PELVIS WITH CONTRAST TECHNIQUE: Multidetector CT imaging of the abdomen and pelvis was performed using the standard protocol following bolus administration of intravenous contrast. CONTRAST:  181mL OMNIPAQUE IOHEXOL 300 MG/ML  SOLN COMPARISON:  Left hip, 1,021 abdomen pelvis, 08/07/2019 FINDINGS: Lower chest: No acute abnormality. Dependent bibasilar scarring and/or atelectasis. Left coronary artery calcifications. Hepatobiliary: No solid liver abnormality is seen. Gallstones and/or sludge in the gallbladder. No gallbladder wall thickening, or biliary dilatation. Pancreas: Unremarkable. No pancreatic ductal dilatation or surrounding inflammatory changes. Spleen: Normal in size without significant abnormality. Adrenals/Urinary Tract: Adrenal glands are unremarkable. Kidneys are normal, without renal calculi, solid lesion, or hydronephrosis. Bladder is unremarkable. Stomach/Bowel: Stomach is within normal limits. Incidental duodenal diverticulum. Appendix appears normal. No evidence of bowel wall thickening, distention, or inflammatory changes. Vascular/Lymphatic: Aortic atherosclerosis. No enlarged abdominal or pelvic lymph nodes. Reproductive: No mass or other significant abnormality. Other: No abdominal wall hernia or abnormality. No  abdominopelvic ascites. Musculoskeletal: Status post left hip total arthroplasty. Severe arthrosis and subchondral cyst formation of the right hip. There is a large hematoma within the left iliacus muscle body and conjoint tendon of the iliopsoas, measuring at least 6.8 x 3.7 x 13.8 cm (series 3, image 64, series 6, image 119). Assessment of the inferior portion is difficult due to dense streak artifact from adjacent hip arthroplasty. There is a superficial fluid collection overlying the left gluteus musculature, which is substantially decreased in size compared to prior CT dated 08/07/2019, measuring 5.0 x 3.3 cm (series 2, image 5). IMPRESSION: 1. Large hematoma within the left iliacus muscle body and conjoint tendon of the iliopsoas, measuring at least 6.8 x 3.7 x 13.8 cm. Assessment of the inferior portion is difficult due to dense streak artifact from adjacent hip arthroplasty. There is no obvious tendon retraction or associated fracture. 2. Superficial fluid collection overlying the left gluteus musculature is substantially decreased in size compared to prior CT dated 08/07/2019, measuring 5.0 x 3.3 cm, consistent with residual hematoma or seroma. 3. Cholelithiasis without evidence of cholecystitis. 4. Coronary artery disease.  Aortic Atherosclerosis (ICD10-I70.0). Electronically Signed   By: Eddie Candle M.D.   On: 11/01/2019 17:13   CT ANKLE RIGHT W CONTRAST  Result Date: 10/31/2019 CLINICAL DATA:  Dehiscence of the surgical wound along the right lateral ankle. Prior plate and screw fixation of the lateral malleolus. EXAM: CT OF THE RIGHT ANKLE WITH CONTRAST TECHNIQUE: Multidetector CT imaging of the right ankle was performed following the standard protocol during bolus administration of intravenous contrast. CONTRAST:  169mL OMNIPAQUE IOHEXOL 300 MG/ML  SOLN COMPARISON:  Radiographs from 10/28/2019 FINDINGS: Bones/Joint/Cartilage Lateral malleolar plate and screw fixator traversing the oblique fracture,  with lack of bony bridging and early cortication along the fracture plane favoring early nonunion. Concern was raised on radiography for lucency around the screws but currently no abnormal lucency is observed around the 5  screws proximal to the fracture or 2 screws distal to the fracture to favor loosening or infection. No bony destructive findings are identified. Small fragments along the anterior margin of the fracture plane indicate mild prior comminution There is periosteal reaction along the posterior tibial metaphysis along with a corticated ossicle along the posteroinferior tibial rim indicating a likely small posterior malleolar fracture component of the prior injury, favoring at least a stage III Weber B injury. There is a small ossicle below the medial malleolus but it appears well corticated and does not encompass the entire attachment of the deep tibiotalar portion of the deltoid ligament., which grossly appears intact, implying that this was probably not a stage IV Weber B injury. Mild irregular spurring with some underlying lucency in the medial talar dome posteriorly for example on images 80-85 of series 6, favoring degenerative spurring and potentially a small sagittally oriented non-fragmented chronic osteochondral injury. Degenerative arthropathy of the Lisfranc joint medially; only part of the Lisfranc joint is included on today's examination but there is clearly considerable spurring and loss of articular space between the medial cuneiform and base of the first metatarsal, and the middle cuneiform and the base of the second metatarsal. Please note that the Lisfranc joint is not included in its entirety. Mild articular surface irregularity along the posterior tibial plafond for example on image 44/7. Plantar calcaneal spur noted. Ligaments Suboptimally assessed by CT. My sense on image 83/9 is that the deep tibiotalar portion of the deltoid ligament is probably intact, although there is some  indistinctness and surrounding edema. The tibionavicular and tibiospring portions of deltoid ligament are obscured by surrounding edema. Small bony fragments just above the expected vicinity of the anterior talofibular ligament. Muscles and Tendons No flexor tendon entrapment. Expanded contour of the tibialis posterior tendon compatible with tendinopathy. Soft tissues Subcutaneous edema surrounding the ankle and tracking in the dorsum of the foot and the heel. There is some confluent mild cutaneous irregularity overlying the lateral plate and screw fixator for example on image 51/8 at least partially from overlying bandaging, but reportedly this is the region of the cutaneous wound. I do not see any gas tracking in the soft tissues. I do not observe a drainable rim enhancing fluid collection to suggest abscess. IMPRESSION: 1. Lateral malleolar plate and screw fixator traversing the oblique fracture, with lack of bony bridging and early cortication along the fracture plane favoring early nonunion. There is also some periosteal reaction along the posterior tibial metaphysis compatible with a small posterior malleolar fracture component of the prior injury. Appearance compatible with previous Weber B stage III fracture. 2. No current lucency along the screws of the plate and screw fixator to suggest loosening or infection around the screws. 3. Small ossicle below the medial malleolus appears well corticated and does not encompass the entire attachment of the deep tibiotalar portion of the deltoid ligament. 4. Degenerative arthropathy of the Lisfranc joint medially, only part of the Lisfranc joint is included on today's examination. 5. Plantar calcaneal spur. 6. Expanded contour of the tibialis posterior tendon compatible with tendinopathy. Correlate clinically in assessing for tibialis posterior dysfunction. 7. Subcutaneous edema surrounding the ankle and tracking in the dorsum of the foot and heel. No drainable  abscess is identified. No gas tracking in the soft tissues. 8. Mild articular surface irregularity along the posterior tibial plafond, query a small sagittally oriented non-fragmented chronic osteochondral injury. Electronically Signed   By: Van Clines M.D.   On: 10/31/2019 08:23  CT FEMUR LEFT W CONTRAST  Result Date: 10/31/2019 CLINICAL DATA:  Acute onset of left thigh pain. Remote left hip arthroplasty. EXAM: CT OF THE LOWER RIGHT EXTREMITY WITH CONTRAST TECHNIQUE: Multidetector CT imaging of the lower right extremity was performed according to the standard protocol following intravenous contrast administration. COMPARISON:  Radiographs, same date. CONTRAST:  161mL OMNIPAQUE IOHEXOL 300 MG/ML  SOLN FINDINGS: The left hip prosthesis is intact. No findings suspicious for periprosthetic fracture or loosening. No dislocation. The left femur is intact. A total left knee arthroplasty is noted. No obvious complicating features. Marked enlargement of the left iliopsoas muscle suggesting hematoma, possible spontaneous bleed or tear, if there was trauma. This is likely causing the patient's left hip pain. There is also some subcutaneous edema or hemorrhage in the lateral hip area. No significant intrapelvic abnormalities are identified. The pubic symphysis and pubic bones are intact. Advanced degenerative changes are noted involving the right hip. IMPRESSION: 1. Marked enlargement of the left iliopsoas muscle suggesting hematoma, possible spontaneous bleed or tear, if there was trauma. 2. Intact left hip prosthesis and no findings suspicious for periprosthetic fracture or loosening. 3. No significant intrapelvic abnormalities. 4. Advanced degenerative changes involving the right hip. Electronically Signed   By: Marijo Sanes M.D.   On: 10/31/2019 16:46   PERIPHERAL VASCULAR CATHETERIZATION  Result Date: 11/02/2019 See op note  US Venous Img Lower Unilateral Right  Result Date: 10/27/2019 CLINICAL DATA:   Erythema and edema EXAM: Right LOWER EXTREMITY VENOUS DOPPLER ULTRASOUND TECHNIQUE: Gray-scale sonography with graded compression, as well as color Doppler and duplex ultrasound were performed to evaluate the lower extremity deep venous systems from the level of the common femoral vein and including the common femoral, femoral, profunda femoral, popliteal and calf veins including the posterior tibial, peroneal and gastrocnemius veins when visible. The superficial great saphenous vein was also interrogated. Spectral Doppler was utilized to evaluate flow at rest and with distal augmentation maneuvers in the common femoral, femoral and popliteal veins. COMPARISON:  None FINDINGS: Contralateral Common Femoral Vein: Respiratory phasicity is normal and symmetric with the symptomatic side. No evidence of thrombus. Normal compressibility. Common Femoral Vein: No evidence of thrombus. Normal compressibility, respiratory phasicity and response to augmentation. Saphenofemoral Junction: There is occlusive thrombus at the junction of the saphenous vein with the common femoral vein. Profunda Femoral Vein: No evidence of thrombus. Normal compressibility and flow on color Doppler imaging. Femoral Vein: No evidence of thrombus. Normal compressibility, respiratory phasicity and response to augmentation. Popliteal Vein: There is occlusive thrombus within the right popliteal vein. Calf Veins: No evidence of thrombus. Normal compressibility and flow on color Doppler imaging. Superficial Great Saphenous Vein: No evidence of thrombus. Normal compressibility. Venous Reflux:  None. Other Findings:  None. IMPRESSION: 1. Deep venous thrombus within the right popliteal vein, with superficial thrombus seen at the saphenofemoral junction. These results will be called to the ordering clinician or representative by the Radiologist Assistant, and communication documented in the PACS or zVision Dashboard. Electronically Signed   By: Randa Ngo  M.D.   On: 10/27/2019 23:04   DG Chest Port 1 View  Result Date: 10/27/2019 CLINICAL DATA:  Altered level of consciousness, sepsis EXAM: PORTABLE CHEST 1 VIEW COMPARISON:  08/07/2019 FINDINGS: Single frontal view of the chest was performed. Lung volumes are diminished, with crowding of the pulmonary vasculature. No definite airspace disease, effusion, or pneumothorax. The cardiac silhouette is stable. IMPRESSION: 1. Low lung volumes with crowding of the pulmonary vasculature. 2. No  acute airspace disease. Electronically Signed   By: Randa Ngo M.D.   On: 10/27/2019 22:40   DG HIP UNILAT WITH PELVIS 2-3 VIEWS LEFT  Result Date: 10/31/2019 CLINICAL DATA:  Left thigh pain EXAM: DG HIP (WITH OR WITHOUT PELVIS) 2-3V LEFT COMPARISON:  08/07/2019 FINDINGS: Pelvic ring is intact. Changes of prior left hip replacement are noted. Significant degenerative changes in the right hip are seen. No soft tissue abnormality is seen. IMPRESSION: Chronic changes without acute abnormality. Electronically Signed   By: Inez Catalina M.D.   On: 10/31/2019 16:42    Assessment and plan-   #Provoked right lower extremity DVT, secondary to wound cellulitis and recent ankle surgery. Therapeutic Lovenox was discontinued due to development of hematoma. Status post IVC filter placement to prevent pulmonary embolism. PT and PTT are normal.  Monitor  #Anemia, acute on chronic, likely secondary to blood loss due to hematoma. #Group B streptococcus bacteremia, source is right leg surgical wound.  Continue Unasyn.  ID recommended 4 weeks of IV antibiotics.  Patient can follow-up with me outpatient. Thank you for allowing me to participate in the care of this patient.   [name], MD, PhD Hematology Oncology [facility] at [facility] Pager- [phone] 11/02/2019

## 2019-11-02 NOTE — Treatment Plan (Signed)
Diagnosis: GBS bacteremia and rt leg hardware infection Acinetobacter wound infection Baseline Creatinine 0.94-1.27    Allergies  Allergen Reactions  . Lisinopril Swelling    OPAT Orders Discharge antibiotics: Unasyn 3 grams IV every 6 hours ( can be done as a 24 hour infusion if needed) End Date: 11/25/19   PIC Care Per Protocol:  Labs weekly on Monday while on IV antibiotics: _X_ CBC with differential __X_ CMP X ESR  _X_ Please pull PIC at completion of IV antibiotics _ Fax weekly labs to Dr.Ravishankar  [phone]  Clinic Follow Up Appt:in 3 weeks  Call [phone] to make appt   

## 2019-11-02 NOTE — Op Note (Signed)
OPERATIVE NOTE   PROCEDURE: 1. Insertion of a right arm double-lumen PICC line with ultrasound and fluoroscopic guidance  PRE-OPERATIVE DIAGNOSIS: Nonhealing wound right ankle; strep bacteremia  POST-OPERATIVE DIAGNOSIS: Same  SURGEON: Katha Cabal M.D.  ANESTHESIA: Local 1% lidocaine  ESTIMATED BLOOD LOSS: Minimal  INDICATIONS:   Becky Trevino is a 74 y.o. female who presents with multiple medical problems and very poor IV access.  She has documented streptococcal bacteremia as well as a nonhealing right ankle wound.  She needs appropriate IV access for her antibiotics and therefore PICC line is being placed.  Risk and benefits of been reviewed patient has agreed to proceed..  DESCRIPTION: After obtaining full informed written consent, the patient was brought back to  to special procedures.  The right arm was sterilely prepped and draped, a sterile surgical field was created. The right brachial vein was accessed under direct ultrasound guidance without difficulty utilizing a micropuncture needle and a 0.018 wire was then advanced into the superior vena cava. A permanent image was recorded. Peel-away sheath was then placed over the wire. A double-lumen peripherally inserted central venous catheter was then advanced over the wire and the wire and peel-away sheath were removed. The catheter tip was then verified to be in the superior vena cava and the catheter was secured to the skin at 35 cm with a sterile dressing including a Biopatch.  The catheter aspirates easily and flushes without difficulty. It is packed with heparinized saline.  Patient tolerated procedure well there were no complications.  COMPLICATIONS: There were no complications  CONDITION: Unchanged  Katha Cabal, M.D. [facility] [phone]   11/02/2019, 12:08 PM

## 2019-11-03 LAB — BASIC METABOLIC PANEL
Anion gap: 6 (ref 5–15)
BUN: 8 mg/dL (ref 8–23)
CO2: 25 mmol/L (ref 22–32)
Calcium: 8.4 mg/dL — ABNORMAL LOW (ref 8.9–10.3)
Chloride: 107 mmol/L (ref 98–111)
Creatinine, Ser: 0.76 mg/dL (ref 0.44–1.00)
GFR calc Af Amer: >60 mL/min (ref >60)
GFR calc non Af Amer: >60 mL/min (ref >60)
Glucose, Bld: 122 mg/dL — ABNORMAL HIGH (ref 70–99)
Potassium: 3.4 mmol/L — ABNORMAL LOW (ref 3.5–5.1)
Sodium: 138 mmol/L (ref 135–145)

## 2019-11-03 LAB — CBC
HCT: 30 % — ABNORMAL LOW (ref 36.0–46.0)
Hemoglobin: 9.4 g/dL — ABNORMAL LOW (ref 12.0–15.0)
MCH: 28.9 pg (ref 26.0–34.0)
MCHC: 31.3 g/dL (ref 30.0–36.0)
MCV: 92.3 fL (ref 80.0–100.0)
Platelets: 180 10*3/uL (ref 150–400)
RBC: 3.25 MIL/uL — ABNORMAL LOW (ref 3.87–5.11)
RDW: 16.8 % — ABNORMAL HIGH (ref 11.5–15.5)
WBC: 10.9 10*3/uL — ABNORMAL HIGH (ref 4.0–10.5)
nRBC: 0.2 % (ref 0.0–0.2)

## 2019-11-03 LAB — GLUCOSE, CAPILLARY
Glucose-Capillary: 74 mg/dL (ref 70–99)
Glucose-Capillary: 80 mg/dL (ref 70–99)
Glucose-Capillary: 82 mg/dL (ref 70–99)
Glucose-Capillary: 85 mg/dL (ref 70–99)
Glucose-Capillary: 92 mg/dL (ref 70–99)

## 2019-11-03 LAB — CULTURE, BLOOD (ROUTINE X 2)
Culture: NO GROWTH
Culture: NO GROWTH
Special Requests: ADEQUATE
Special Requests: ADEQUATE

## 2019-11-03 LAB — MAGNESIUM: Magnesium: 2.3 mg/dL (ref 1.7–2.4)

## 2019-11-03 MED ORDER — GUAIFENESIN ER 600 MG PO TB12
600.0000 mg | ORAL_TABLET | Freq: Two times a day (BID) | ORAL | Status: DC | PRN
  Administered 2019-11-03 – 2019-11-04 (×2): 600 mg via ORAL
  Filled 2019-11-03 (×3): qty 1

## 2019-11-03 MED ORDER — DOXYCYCLINE HYCLATE 100 MG PO TABS
100.0000 mg | ORAL_TABLET | Freq: Two times a day (BID) | ORAL | Status: DC
  Administered 2019-11-03 – 2019-11-07 (×8): 100 mg via ORAL
  Filled 2019-11-03 (×8): qty 1

## 2019-11-03 NOTE — Progress Notes (Signed)
PROGRESS NOTE  Becky Trevino [id number] DOB: 03-Dec-1945 DOA: 10/27/2019 PCP: [name], MD  Brief History   74 year old woman PMH diabetes mellitus type 2, right ankle fracture in December post ORIF, presented with confusion and shortness of breath February 11.  Found to have right lower extremity DVT and associated cellulitis.  A & P  Group B streptococcus bacteremia suspected secondary to right lower extremity cellulitis, nonhealing dehiscence prior ORIF site, ascending acinetobacter wound infection --Seen by infectious disease with recommendation for Unasyn 4 weeks, end date 11/25/2019 --[facility] Care Per Protocol:  Labs weeklyon Mondaywhile on IV antibiotics: _X_ CBC with differential __X_ CMP X ESR  _X_ Please pull PIC at completion of IV antibiotics _ Fax weekly labs toDr.Ravishankar([phone]) [facility] Follow Up Appt:in 3 weeks Call [phone] to make appt  Right lower extremity DVT provoked by ankle surgery December 2020 as well as immobility. --Anticoagulation stopped.  IVC filter placed.  Seen by hematology.  Coagulopathy evaluation will be followed up in the outpatient setting by hematology.  Spontaneous iliopsoas hematoma secondary to therapeutic Lovenox.  Patient with history of left gluteal hematoma that had to be evacuated by orthopedics in the past while the patient was on Xarelto. --Patient was seen by orthopedics and vascular surgery.  Lovenox was stopped.  IVC filter was placed 2/17. --f/u with Dr. Tasia Catchings as an outpatient --Patient has significant weakness, pain from this hematoma and is not able to function as she was previously at home.  Is not clear to me that she will be able to return directly home, she may need SNF.  Awaiting physical therapy evaluation.  Right lower extremity wound status post right ankle ORIF December 2020 --Evaluated by surgery, did not appear infected.  Continues to be non weightbearing right lower extremity.   Peripheral vascular disease --Status post right lower extremity angiogram 2/17  Procedure(s) Performed: 1. Abdominal aortogram 2. Right lower extremity distal runoff third order catheter placement 3. 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 --Doxycycline on hold while on Unasyn.  Need to resume this once Unasyn completes.  AKI superimposed on CKD stage IIIa thought secondary to dehydration --Resolved  Diabetes mellitus type 2 -- Stable.   DVT prophylaxis: TED hose Code Status: Full Family Communication: husband at bedside Disposition Plan: pending PT eval    Murray Hodgkins, MD  [facility] Direct contact: see [email or web address] (further directions at bottom of note if needed) 7PM-7AM contact night coverage as at bottom of note 11/03/2019, 3:32 PM  LOS: 7 days    Interval History/Subjective  Feels okay today.  Notes continued pain in left leg and weakness in left leg and numbness which has been going on since she developed to the hematoma.  She reports she is not able to stand or move like she was at home.  She is nonambulatory on the right side secondary to ankle fracture but had been able to use her left leg.  Objective   Vitals:  Vitals:   11/03/19 0439 11/03/19 1150  BP: (!) 119/57 136/71  Pulse: 80 80  Resp: 20 18  Temp: 98.5 F (36.9 C) 98.9 F (37.2 C)  SpO2: 98% 98%    Exam:  Constitutional.  Appears calm, comfortable. Respiratory.  Clear to auscultation bilaterally.  No wheezes, rales or rhonchi.  Normal respiratory effort. Cardiovascular.  Regular rate and rhythm.  No murmur, rub or gallop.  No lower extremity edema  of note. Psychiatric.  Grossly normal mood and affect.  Speech fluent and appropriate. Left lower extremity.  Able to move spontaneously, appears weak secondary to pain.  Sensation  grossly intact  I have personally reviewed the following:   Today's Data  . Potassium 3.4, magnesium 2.3, remainder BMP unremarkable . WBC stable at 10.9, hemoglobin stable 9.4.  Scheduled Meds: . brimonidine  1 drop Both Eyes TID   And  . brinzolamide  1 drop Both Eyes TID  . calcium-vitamin D  1 tablet Oral BID  . Chlorhexidine Gluconate Cloth  6 each Topical Daily  . fluticasone  2 spray Each Nare Daily  . imipramine  25 mg Oral QHS  . magnesium oxide  400 mg Oral Daily  . metoprolol succinate  100 mg Oral QHS  . midodrine  5 mg Oral TID PC  . mirabegron ER  25 mg Oral Daily   Continuous Infusions: . sodium chloride 100 mL/hr at 11/03/19 1002  . ampicillin-sulbactam (UNASYN) IV 3 g (11/03/19 1300)    Principal Problem:   Bacteremia due to group B Streptococcus Active Problems:   Chronic kidney disease (CKD), stage III (moderate)   Type 2 diabetes mellitus with diabetic neuropathy, without long-term current use of insulin (HCC)   Cellulitis of right lower extremity   Acute deep vein thrombosis (DVT) of right lower extremity (HCC)   Acute pain of left thigh   LOS: 7 days   How to contact the [facility] Attending or Consulting provider [place] or covering provider during after hours [place], for this patient?  1. Check the care team in [facility] and look for a) attending/consulting [facility] provider listed and b) the [facility] team listed 2. Log into [email or web address] and use [facility] universal password to access. If you do not have the password, please contact the hospital operator. 3. Locate the [facility] provider you are looking for under [facility] and page to a number that you can be directly reached. 4. If you still have difficulty reaching the provider, please page the [facility] (Director on Call) for the Hospitalists listed on amion for assistance.

## 2019-11-03 NOTE — Progress Notes (Signed)
FSBS 80. Carb and protein adm.

## 2019-11-03 NOTE — Progress Notes (Signed)
Physical Therapy Treatment Patient Details Name: Becky Trevino MRN: [id number] DOB: Dec 31, 1945 Today's Date: 11/03/2019    History of Present Illness Pt admitted with R LE cellulitis with complaints of confusion and dyspnea. HIstory includes R ankle fx s/p ORIF on 12.18.20, DM, and HTN. Pt with positive DVT, started on anticoag this date.    PT Comments    Pt was long sitting in bed upon arriving, She is A and O x 4 and cooperative. Complained of L hip discomfort but did not rate as pain. With [facility] elevated + increased time and Min assist able to progress to sitting EOB on L side of bed. Required assistance with LEs/trunk support. Sat EOB with vital stable x  several minutes  Prior to standing x 2. From very elevated bed height, pt required max assist on first trial to stand to RW. Mod assist on 2nd trial. She ws unable to maintain NWB RLE (CAM boot donned throughout). Pt very fatigued after standing 2 x. She required mod assist to return to supine in bed. Lengthy discussion about d/c disposition with pt/pt's spouse. Per pt, I only want to go home. Pt was scheduled to start OP PT this date but due to recent medical complications is here. Therapist discussed rehab option with pt to improve safety and overall ability however pt is uninterested. PT will continue to follow pt per POC. She was repositioned in bed post session with call bell in reach, spouse at bedside, and bed alarm set.    Follow Up Recommendations  SNF     Equipment Recommendations  None recommended by PT    Recommendations for Other Services       Precautions / Restrictions Precautions Precautions: Fall Restrictions Weight Bearing Restrictions: Yes RLE Weight Bearing: Non weight bearing    Mobility  Bed Mobility Overal bed mobility: Needs Assistance Bed Mobility: Supine to Sit     Supine to sit: Min assist     General bed mobility comments: HOB elevated. Pt required min assist to exit L side.  Assisted with  trunk support while assisting LEs to EOB-floor. She required mod assist once fatigued.  Transfers Overall transfer level: Needs assistance Equipment used: Rolling walker (2 wheeled) Transfers: Sit to/from Stand Sit to Stand: Mod assist;Max assist         General transfer comment: From elevated bed height, Pt stood EOB 3 x with max assist of one with first trial and required mod assist on 2nd trial. Pt was unable to maintain NWB RLE. UNwilling to progress more with transfers and/or gait  Ambulation/Gait             General Gait Details: unable to progress away form bed 2/2 to inability to maintain NWB   Stairs             Wheelchair Mobility    Modified Rankin (Stroke Patients Only)       Balance                                            Cognition Arousal/Alertness: Awake/alert Behavior During Therapy: WFL for tasks assessed/performed Overall Cognitive Status: Within Functional Limits for tasks assessed                                 General Comments: Pt is A  and O x 4      Exercises      General Comments        Pertinent Vitals/Pain Pain Assessment: 0-10 Pain Score: 4  Faces Pain Scale: Hurts a little bit Pain Location: L thigh Pain Descriptors / Indicators: Aching;Constant;Grimacing;Guarding Pain Intervention(s): Limited activity within patient's tolerance;Monitored during session    Home Living                      Prior Function            PT Goals (current goals can now be found in the care plan section) Acute Rehab PT Goals Patient Stated Goal: to go home Progress towards PT goals: Progressing toward goals    Frequency    Min 2X/week      PT Plan Discharge plan needs to be updated    Co-evaluation              AM-PAC PT "6 Clicks" Mobility   Outcome Measure  Help needed turning from your back to your side while in a flat bed without using bedrails?: A Little Help needed  moving from lying on your back to sitting on the side of a flat bed without using bedrails?: A Little Help needed moving to and from a bed to a chair (including a wheelchair)?: A Lot Help needed standing up from a chair using your arms (e.g., wheelchair or bedside chair)?: A Lot Help needed to walk in hospital room?: A Lot Help needed climbing 3-5 steps with a railing? : A Lot 6 Click Score: 14    End of Session Equipment Utilized During Treatment: Gait belt Activity Tolerance: Patient limited by pain;Patient limited by fatigue Patient left: in bed;with call bell/phone within reach;with bed alarm set Nurse Communication: Mobility status PT Visit Diagnosis: Muscle weakness (generalized) (M62.81);Difficulty in walking, not elsewhere classified (R26.2);Unsteadiness on feet (R26.81)     Time: 1340-1405 PT Time Calculation (min) (ACUTE ONLY): 25 min  Charges:  $Therapeutic Activity: 23-37 mins                     Julaine Fusi PTA 11/03/19, 5:04 PM

## 2019-11-03 NOTE — Progress Notes (Signed)
PODIATRY / FOOT AND ANKLE SURGERY PROGRESS NOTE  Reason for consult: Right lateral ankle wound  Chief Complaint: R ankle wound, leg pain left   HPI: Becky Trevino is a 74 y.o. female who presents resting in bed comfortably.  Patient states that she has improvement overall in the left hip pain.  She also notes that she still continues to have no pain to the right lateral ankle wound.  She states that she is also been working with physical therapy.  Overall she feels like she is doing fairly well.  PMHx:  Past Medical History:  Diagnosis Date  . Cervical spondylosis   . Chronic kidney disease    Stage 3  . DDD (degenerative disc disease), cervical    [facility]  . Diabetes mellitus without complication ([place])   . Diverticulosis   . Hyperlipidemia   . Hypertension   . Intertrigo   . Leg cramps   . Leg varices   . Obesity   . OSA (obstructive sleep apnea)   . Symptomatic menopausal or female climacteric states   . Syncope     Surgical Hx:  Past Surgical History:  Procedure Laterality Date  . ABDOMINAL HYSTERECTOMY  1993   Total  . BUNIONECTOMY Bilateral 1993  . I & D KNEE WITH POLY EXCHANGE Right 11/19/2017   Procedure: RIGHT KNEE POLY EXCHANGE WITH IRRIGATION AND DEBRIDEMENT;  Surgeon: Hessie Knows, MD;  Location: [facility];  Service: Orthopedics;  Laterality: Right;  . JOINT REPLACEMENT     bilateral knee  . LAMINECTOMY  11/14/2013    Cervical Fusion , [facility], Dr. Delilah Shan  . LASER ABLATION Bilateral 07/29/2012   Dr. Lucky Cowboy  . LOWER EXTREMITY ANGIOGRAPHY Right 11/02/2019   Procedure: Lower Extremity Angiography;  Surgeon: Katha Cabal, MD;  Location: [facility];  Service: Cardiovascular;  Laterality: Right;  . ORIF ANKLE FRACTURE Right 09/02/2019   Procedure: OPEN REDUCTION INTERNAL FIXATION (ORIF) ANKLE FRACTURE BIMALLEOLAR;  Surgeon: [name], DPM;  Location: [facility];  Service: Podiatry;  Laterality: Right;  . REPLACEMENT TOTAL KNEE Left 03/2010   Dr.  Rudene Christians  . TOTAL HIP ARTHROPLASTY Left 12/11/2015   Procedure: TOTAL HIP ARTHROPLASTY ANTERIOR APPROACH;  Surgeon: Hessie Knows, MD;  Location: [facility];  Service: Orthopedics;  Laterality: Left;  . TOTAL KNEE ARTHROPLASTY Right 03/11/2016   Procedure: TOTAL KNEE ARTHROPLASTY;  Surgeon: Hessie Knows, MD;  Location: [facility];  Service: Orthopedics;  Laterality: Right;    FHx:  Family History  Problem Relation Age of Onset  . Diabetes Mother   . Hyperlipidemia Mother   . Hypertension Mother   . Diabetes Father   . Hyperlipidemia Father   . Hypertension Father   . Obesity Father   . Hypertension Sister   . Hyperlipidemia Sister   . Hyperlipidemia Sister   . Breast cancer Neg Hx     Social History:  reports that she has never smoked. She has never used smokeless tobacco. She reports that she does not drink alcohol or use drugs.  Allergies:  Allergies  Allergen Reactions  . Lisinopril Swelling    Review of Systems: General ROS: negative Respiratory ROS: no cough, shortness of breath, or wheezing Cardiovascular ROS: no chest pain or dyspnea on exertion Musculoskeletal ROS: positive for - joint pain and swelling in leg - left Neurological ROS: positive for - numbness/tingling Dermatological ROS: positive for right lateral ankle wound  Medications Prior to Admission  Medication Sig Dispense Refill  . acetaminophen (TYLENOL) 650  MG CR tablet Take 650-1,300 mg by mouth 2 (two) times daily as needed for pain.    Marland Kitchen atorvastatin (LIPITOR) 40 MG tablet Take 1 tablet (40 mg total) by mouth daily at 6 PM. (Patient taking differently: Take 40 mg by mouth every evening. ) 90 tablet 1  . Calcium Carbonate-Vitamin D (CALCIUM 600-D) 600-400 MG-UNIT per tablet Take 1 tablet by mouth 2 (two) times daily.    Marland Kitchen doxycycline (VIBRAMYCIN) 100 MG capsule Take 100 mg by mouth 2 (two) times daily.    Marland Kitchen enoxaparin (LOVENOX) 40 MG/0.4ML injection Inject 0.4 mLs (40 mg total) into the skin daily. 20 mL 0  .  fluticasone (FLONASE) 50 MCG/ACT nasal spray Place 2 sprays into both nostrils daily. 16 g 2  . glucose blood (ONE TOUCH ULTRA TEST) test strip Use as instructed 100 each 12  . imipramine (TOFRANIL) 25 MG tablet Take 1 tablet (25 mg total) by mouth at bedtime. 90 tablet 3  . ketoconazole (NIZORAL) 2 % cream Apply 1 application topically 2 (two) times daily as needed (rash).     . Magnesium Oxide 500 MG CAPS Take 500 mg by mouth daily.     . metoprolol succinate (TOPROL-XL) 100 MG 24 hr tablet Take 100 mg by mouth at bedtime.    . midodrine (PROAMATINE) 5 MG tablet Take 1 tablet (5 mg total) by mouth 3 (three) times daily with meals. (Patient taking differently: Take 5 mg by mouth 3 (three) times daily. ) 90 tablet 0  . mirabegron ER (MYRBETRIQ) 25 MG TB24 tablet Take 1 tablet (25 mg total) by mouth daily. 90 tablet 3  . oxyCODONE-acetaminophen (PERCOCET) 7.5-325 MG tablet Take 1 tablet by mouth every 4 (four) hours as needed for severe pain.    Marland Kitchen SIMBRINZA 1-0.2 % SUSP Place 1 drop into both eyes 2 (two) times daily.    . celecoxib (CELEBREX) 200 MG capsule Take 200 mg by mouth daily as needed for moderate pain.   11  . furosemide (LASIX) 20 MG tablet Take 20 mg by mouth every evening.    . gabapentin (NEURONTIN) 300 MG capsule Take 1 capsule (300 mg total) by mouth every evening. (Patient not taking: Reported on 09/23/2019) 90 capsule 2  . Netarsudil-Latanoprost (ROCKLATAN) 0.02-0.005 % SOLN Place 1 drop into both eyes at bedtime.    Marland Kitchen oxycodone (OXY-IR) 5 MG capsule Take 5 mg by mouth every 6 (six) hours as needed for pain.    Marland Kitchen oxyCODONE-acetaminophen (PERCOCET/ROXICET) 5-325 MG tablet Take 1 tablet by mouth as directed.    . potassium chloride (KLOR-CON) 10 MEQ tablet Take 10 mEq by mouth daily.    . traZODone (DESYREL) 50 MG tablet TAKE 1 TABLET BY MOUTH AT BEDTIME AS NEEDED FOR SLEEP (Patient not taking: Reported on 10/27/2019) 90 tablet 1  . XARELTO 20 MG TABS tablet Take 20 mg by mouth daily.       Physical Exam: General: Alert and oriented.  No apparent distress.  Vascular: DP/PT pulses faintly palpable bil.  CFT intact to digits bil.  Mild nonpitting edema to the right lower extremity.  Erythema overall absent to the right lower extremity since admission, much improved.  Neuro: Light touch sensation reduced to digits bil.  Derm: Central aspect of incision line right lateral ankle over the fibular dehiscence site probes to deep subQ tissue, approximately 0.5cm deep, mild serousanginous drainage today, no odor, no periwound erythema present, no odor, no visible plate or bone.  Wound appears smaller in  size since last visit and is improving steadily.  Depth also appears to be improved today by about 2 mm.  Wound measures today 1.5 x 0.2 x 0.5 cm.  MSK: No POP R calf area.  No POP of the R lateral ankle.  No pain with AJ DF and PF R.   Results for orders placed or performed during the hospital encounter of 10/27/19 (from the past 48 hour(s))  Glucose, capillary     Status: Abnormal   Collection Time: 11/01/19  4:52 PM  Result Value Ref Range   Glucose-Capillary 113 (H) 70 - 99 mg/dL  Glucose, capillary     Status: Abnormal   Collection Time: 11/01/19  8:17 PM  Result Value Ref Range   Glucose-Capillary 117 (H) 70 - 99 mg/dL  Glucose, capillary     Status: Abnormal   Collection Time: 11/01/19 11:43 PM  Result Value Ref Range   Glucose-Capillary 116 (H) 70 - 99 mg/dL  Glucose, capillary     Status: None   Collection Time: 11/02/19  3:31 AM  Result Value Ref Range   Glucose-Capillary 90 70 - 99 mg/dL  Protime-INR     Status: None   Collection Time: 11/02/19  6:52 AM  Result Value Ref Range   Prothrombin Time 14.0 11.4 - 15.2 seconds   INR 1.1 0.8 - 1.2    Comment: (NOTE) INR goal varies based on device and disease states. Performed at [facility], [address] [place], [place] [address]   APTT     Status: None   Collection Time: 11/02/19  6:52 AM  Result  Value Ref Range   aPTT 36 24 - 36 seconds    Comment: Performed at [facility], [address] [place], [place] [id number]  Basic metabolic panel     Status: Abnormal   Collection Time: 11/02/19  6:52 AM  Result Value Ref Range   Sodium 138 135 - 145 mmol/L   Potassium 3.4 (L) 3.5 - 5.1 mmol/L   Chloride 105 98 - 111 mmol/L   CO2 24 22 - 32 mmol/L   Glucose, Bld 89 70 - 99 mg/dL   BUN 10 8 - 23 mg/dL   Creatinine, Ser 0.94 0.44 - 1.00 mg/dL   Calcium 8.7 (L) 8.9 - 10.3 mg/dL   GFR calc non Af Amer 60 (L) >60 mL/min   GFR calc Af Amer >60 >60 mL/min   Anion gap 9 5 - 15    Comment: Performed at [facility], [address] [place], [place] [address]  CBC     Status: Abnormal   Collection Time: 11/02/19  6:52 AM  Result Value Ref Range   WBC 10.6 (H) 4.0 - 10.5 K/uL   RBC 3.51 (L) 3.87 - 5.11 MIL/uL   Hemoglobin 10.2 (L) 12.0 - 15.0 g/dL   HCT 32.5 (L) 36.0 - 46.0 %   MCV 92.6 80.0 - 100.0 fL   MCH 29.1 26.0 - 34.0 pg   MCHC 31.4 30.0 - 36.0 g/dL   RDW 16.6 (H) 11.5 - 15.5 %   Platelets 182 150 - 400 K/uL   nRBC 0.5 (H) 0.0 - 0.2 %    Comment: Performed at [facility], [address] [place],  [address]  Magnesium     Status: None   Collection Time: 11/02/19  6:52 AM  Result Value Ref Range   Magnesium 2.2 1.7 - 2.4 mg/dL    Comment: Performed at [facility], [address] [place], [place] [address]  Glucose, capillary     Status: None   Collection Time: 11/02/19  8:11 AM  Result Value Ref Range   Glucose-Capillary 87 70 - 99 mg/dL   Comment 1 Notify RN   Glucose, capillary     Status: None   Collection Time: 11/02/19 10:08 AM  Result Value Ref Range   Glucose-Capillary 85 70 - 99 mg/dL  Glucose, capillary     Status: Abnormal   Collection Time: 11/02/19  5:02 PM  Result Value Ref Range   Glucose-Capillary 146 (H) 70 - 99 mg/dL   Comment 1 Notify RN   Glucose, capillary     Status: None   Collection Time: 11/02/19   7:58 PM  Result Value Ref Range   Glucose-Capillary 95 70 - 99 mg/dL  Glucose, capillary     Status: None   Collection Time: 11/02/19 11:57 PM  Result Value Ref Range   Glucose-Capillary 96 70 - 99 mg/dL  Glucose, capillary     Status: None   Collection Time: 11/03/19  3:54 AM  Result Value Ref Range   Glucose-Capillary 80 70 - 99 mg/dL  Basic metabolic panel     Status: Abnormal   Collection Time: 11/03/19  4:58 AM  Result Value Ref Range   Sodium 138 135 - 145 mmol/L   Potassium 3.4 (L) 3.5 - 5.1 mmol/L   Chloride 107 98 - 111 mmol/L   CO2 25 22 - 32 mmol/L   Glucose, Bld 122 (H) 70 - 99 mg/dL   BUN 8 8 - 23 mg/dL   Creatinine, Ser 0.76 0.44 - 1.00 mg/dL   Calcium 8.4 (L) 8.9 - 10.3 mg/dL   GFR calc non Af Amer >60 >60 mL/min   GFR calc Af Amer >60 >60 mL/min   Anion gap 6 5 - 15    Comment: Performed at [facility], [address] [place], [place] [address]  CBC     Status: Abnormal   Collection Time: 11/03/19  4:58 AM  Result Value Ref Range   WBC 10.9 (H) 4.0 - 10.5 K/uL   RBC 3.25 (L) 3.87 - 5.11 MIL/uL   Hemoglobin 9.4 (L) 12.0 - 15.0 g/dL   HCT 30.0 (L) 36.0 - 46.0 %   MCV 92.3 80.0 - 100.0 fL   MCH 28.9 26.0 - 34.0 pg   MCHC 31.3 30.0 - 36.0 g/dL   RDW 16.8 (H) 11.5 - 15.5 %   Platelets 180 150 - 400 K/uL   nRBC 0.2 0.0 - 0.2 %    Comment: Performed at [facility], [address], [place], [place] [address]  Magnesium     Status: None   Collection Time: 11/03/19  4:58 AM  Result Value Ref Range   Magnesium 2.3 1.7 - 2.4 mg/dL    Comment: Performed at [facility], [address] [place], [place] [address]  Glucose, capillary     Status: None   Collection Time: 11/03/19  7:40 AM  Result Value Ref Range   Glucose-Capillary 74 70 - 99 mg/dL  Glucose, capillary     Status: None   Collection Time: 11/03/19 11:48 AM  Result Value Ref Range   Glucose-Capillary 85 70 - 99 mg/dL   CT ABDOMEN PELVIS W CONTRAST  Result Date:  11/01/2019 CLINICAL DATA:  Hip pain, abscess suspected EXAM: CT ABDOMEN AND PELVIS WITH CONTRAST TECHNIQUE: Multidetector CT imaging of the abdomen and pelvis was performed  using the standard protocol following bolus administration of intravenous contrast. CONTRAST:  134mL OMNIPAQUE IOHEXOL 300 MG/ML  SOLN COMPARISON:  Left hip, 1,021 abdomen pelvis, 08/07/2019 FINDINGS: Lower chest: No acute abnormality. Dependent bibasilar scarring and/or atelectasis. Left coronary artery calcifications. Hepatobiliary: No solid liver abnormality is seen. Gallstones and/or sludge in the gallbladder. No gallbladder wall thickening, or biliary dilatation. Pancreas: Unremarkable. No pancreatic ductal dilatation or surrounding inflammatory changes. Spleen: Normal in size without significant abnormality. Adrenals/Urinary Tract: Adrenal glands are unremarkable. Kidneys are normal, without renal calculi, solid lesion, or hydronephrosis. Bladder is unremarkable. Stomach/Bowel: Stomach is within normal limits. Incidental duodenal diverticulum. Appendix appears normal. No evidence of bowel wall thickening, distention, or inflammatory changes. Vascular/Lymphatic: Aortic atherosclerosis. No enlarged abdominal or pelvic lymph nodes. Reproductive: No mass or other significant abnormality. Other: No abdominal wall hernia or abnormality. No abdominopelvic ascites. Musculoskeletal: Status post left hip total arthroplasty. Severe arthrosis and subchondral cyst formation of the right hip. There is a large hematoma within the left iliacus muscle body and conjoint tendon of the iliopsoas, measuring at least 6.8 x 3.7 x 13.8 cm (series 3, image 64, series 6, image 119). Assessment of the inferior portion is difficult due to dense streak artifact from adjacent hip arthroplasty. There is a superficial fluid collection overlying the left gluteus musculature, which is substantially decreased in size compared to prior CT dated 08/07/2019, measuring 5.0 x 3.3  cm (series 2, image 5). IMPRESSION: 1. Large hematoma within the left iliacus muscle body and conjoint tendon of the iliopsoas, measuring at least 6.8 x 3.7 x 13.8 cm. Assessment of the inferior portion is difficult due to dense streak artifact from adjacent hip arthroplasty. There is no obvious tendon retraction or associated fracture. 2. Superficial fluid collection overlying the left gluteus musculature is substantially decreased in size compared to prior CT dated 08/07/2019, measuring 5.0 x 3.3 cm, consistent with residual hematoma or seroma. 3. Cholelithiasis without evidence of cholecystitis. 4. Coronary artery disease.  Aortic Atherosclerosis (ICD10-I70.0). Electronically Signed   By: Eddie Candle M.D.   On: 11/01/2019 17:13   PERIPHERAL VASCULAR CATHETERIZATION  Result Date: 11/02/2019 See op note   Blood pressure 136/71, pulse 80, temperature 98.9 F (37.2 C), temperature source Oral, resp. rate 18, height 5\' 11"  (1.803 m), weight 116.2 kg, SpO2 98 %.  Assessment 1. Bacteremia with leukocytosis and altered mental status potentially 2/2 R lateral ankle wound vs other source? 2. DVT right lower extremity 3. Diabetes type 2 polyneuropathy 4. Right ankle fracture status post ORIF with delayed healing  Plan -Patient seen and examined. -100% excisional subcutaneous wound debridement performed to the right lateral ankle wound with 15 blade without incident.  Patient tolerated procedure well.  Hemostasis achieved with compression.  Predebridement measurement same as post.  Appears to be filling in well at this time is depth as decreased overall in size has decreased overall over the past few weeks. -Applied saline soaked gauze to the wound and Betadine to the periphery followed by 4 x 4 gauze, ABD, Kerlix, and Ace wrap from the forefoot to below knee.  Change every other day.  Order placed. -Appreciate recommendations per infectious disease for antibiotic therapy.  Blood culture from 3 days ago  with no growth to date.  Wound culture with gram-positive cocci and gram-positive rods.  Few strep agalactiae, Acinetobacter rare growth from wound culture.  Blood culture from 4 days ago with strep agalactiae.  Recommend outpatient IV antibiotics. -Appreciate vascular recommendations.  Believe patient has adequate  blood flow at this point to heal right lateral ankle wound. -Believe no surgical intervention is indicated at this time to the R ankle wound as the wound appears to not go to bone at this point as there is no plate or bone exposed.  We will continue to monitor the situation outpatient.  Earliest time to potentially remove hardware be at the 22-month mark postoperatively.  Patient is currently at 8-9 weeks. -Patient to be nonweightbearing still to the right lower extremity.  Podiatry team to sign off at this time.  Discharge instructions placed in chart.  Call with any questions or concerns.  [name], DPM 11/03/2019, 12:33 PM

## 2019-11-04 LAB — BASIC METABOLIC PANEL
Anion gap: 7 (ref 5–15)
BUN: 9 mg/dL (ref 8–23)
CO2: 26 mmol/L (ref 22–32)
Calcium: 8.4 mg/dL — ABNORMAL LOW (ref 8.9–10.3)
Chloride: 106 mmol/L (ref 98–111)
Creatinine, Ser: 0.76 mg/dL (ref 0.44–1.00)
GFR calc Af Amer: >60 mL/min (ref >60)
GFR calc non Af Amer: >60 mL/min (ref >60)
Glucose, Bld: 97 mg/dL (ref 70–99)
Potassium: 3.4 mmol/L — ABNORMAL LOW (ref 3.5–5.1)
Sodium: 139 mmol/L (ref 135–145)

## 2019-11-04 LAB — CBC
HCT: 30.1 % — ABNORMAL LOW (ref 36.0–46.0)
Hemoglobin: 9.6 g/dL — ABNORMAL LOW (ref 12.0–15.0)
MCH: 28.9 pg (ref 26.0–34.0)
MCHC: 31.9 g/dL (ref 30.0–36.0)
MCV: 90.7 fL (ref 80.0–100.0)
Platelets: 194 10*3/uL (ref 150–400)
RBC: 3.32 MIL/uL — ABNORMAL LOW (ref 3.87–5.11)
RDW: 16.8 % — ABNORMAL HIGH (ref 11.5–15.5)
WBC: 9.4 10*3/uL (ref 4.0–10.5)
nRBC: 0.4 % — ABNORMAL HIGH (ref 0.0–0.2)

## 2019-11-04 LAB — GLUCOSE, CAPILLARY
Glucose-Capillary: 84 mg/dL (ref 70–99)
Glucose-Capillary: 68 mg/dL — ABNORMAL LOW (ref 70–99)
Glucose-Capillary: 109 mg/dL — ABNORMAL HIGH (ref 70–99)
Glucose-Capillary: 77 mg/dL (ref 70–99)
Glucose-Capillary: 80 mg/dL (ref 70–99)

## 2019-11-04 LAB — MAGNESIUM: Magnesium: 2.2 mg/dL (ref 1.7–2.4)

## 2019-11-04 MED ORDER — POTASSIUM CHLORIDE CRYS ER 20 MEQ PO TBCR
40.0000 meq | EXTENDED_RELEASE_TABLET | Freq: Once | ORAL | Status: AC
  Administered 2019-11-04: 09:00:00 40 meq via ORAL
  Filled 2019-11-04: qty 2

## 2019-11-04 NOTE — TOC Progression Note (Signed)
Transition of Care [facility]) - Progression Note    Patient Details  Name: Becky Trevino MRN: [id number] Date of Birth: 02-Jan-1946  [facility]) CM/SW Contact  Beverly Sessions, RN Phone Number: 11/04/2019, 3:49 PM  Clinical Narrative:    Current bed offers from [facility] and [facility]. Patient would like to wait till morning to see if there are any other offers    Expected Discharge Plan: [place] Barriers to Discharge: No Barriers Identified  Expected Discharge Plan and Services Expected Discharge Plan: [place] arrangements for the past 2 months: Single Family Home                                       Social Determinants of Health (SDOH) Interventions    Readmission Risk Interventions No flowsheet data found.

## 2019-11-04 NOTE — TOC Progression Note (Signed)
Transition of Care [facility]) - Progression Note    Patient Details  Name: Becky Trevino MRN: [id number] Date of Birth: 29-Jan-1946  Transition of Care [facility]) CM/SW Contact  Beverly Sessions, RN Phone Number: 11/04/2019, 1:32 PM  Clinical Narrative:     PT has updated recommendation to SNF Met with patient and husband at bedside  Agreeable to bed search Patient also requires IV antibiotics at discharge  Existing PASRR Fl2 sent for signature Bed search initiated  [place] with [facility], and [place] with [facility] updated     Expected Discharge Plan: [place] Barriers to Discharge: No Barriers Identified  Expected Discharge Plan and Services Expected Discharge Plan: [place] arrangements for the past 2 months: Single Family Home                                       Social Determinants of Health (SDOH) Interventions    Readmission Risk Interventions No flowsheet data found.

## 2019-11-04 NOTE — NC FL2 (Signed)
[place] LEVEL OF CARE SCREENING TOOL     IDENTIFICATION  Patient Name: Becky Trevino Birthdate: Nov 13, 1945 Sex: female Admission Date (Current Location): 10/27/2019  [facility] and [place] Number:  Engineering geologist and Address:         Provider Number: [id number]  Attending Physician Name and Address:  Samuella Cota, MD  Relative Name and Phone Number:       Current Level of Care: Hospital Recommended Level of Care: [place] Prior Approval Number:    Date Approved/Denied:   PASRR Number: [id number]  Discharge Plan: Home    Current Diagnoses: Patient Active Problem List   Diagnosis Date Noted  . Acute pain of left thigh   . Bacteremia due to group B Streptococcus 10/30/2019  . Acute deep vein thrombosis (DVT) of right lower extremity ([place]) 10/30/2019  . Cellulitis of right lower extremity 10/27/2019  . Hematoma   . Syncope 08/07/2019  . Anemia 08/07/2019  . Chronic infection of right knee ([place]) 11/25/2018  . Infection and inflammatory reaction due to internal joint prosthesis, subsequent encounter 01/25/2018  . Infection of total right knee replacement ([place]) 11/19/2017  . Type 2 diabetes mellitus with diabetic neuropathy, without long-term current use of insulin ([place]) 11/06/2016  . Primary osteoarthritis of knee 03/11/2016  . Primary osteoarthritis of left hip 12/11/2015  . Benign essential HTN 04/21/2015  . Edema leg 04/21/2015  . Chronic kidney disease (CKD), stage III (moderate) 04/21/2015  . Chronic venous insufficiency 04/21/2015  . DDD (degenerative disc disease), cervical 04/21/2015  . Diabetes mellitus with renal manifestation ([place]) 04/21/2015  . Dyslipidemia 04/21/2015  . Bladder cystocele 04/21/2015  . Urinary incontinence 04/21/2015  . Leg varices 04/21/2015  . Insomnia 04/21/2015  . Eczema intertrigo 04/21/2015  . Obstructive apnea 04/21/2015  . Menopausal and perimenopausal disorder 04/21/2015  .  Supraventricular premature beats 04/21/2015  . Osteoarthritis of multiple joints 04/21/2015  . H/O Spinal surgery 11/14/2013  . Detrusor muscle hypertonia 11/07/2013    Orientation RESPIRATION BLADDER Height & Weight     Self, Time, Situation, Place  Normal Incontinent, External catheter Weight: 116.2 kg Height:  5\' 11"  (180.3 cm)  BEHAVIORAL SYMPTOMS/MOOD NEUROLOGICAL BOWEL NUTRITION STATUS      Continent Diet(Heart Healthy)  AMBULATORY STATUS COMMUNICATION OF NEEDS Skin   Extensive Assist Verbally Surgical wounds                       Personal Care Assistance Level of Assistance              Functional Limitations Info             SPECIAL CARE FACTORS FREQUENCY  PT (By licensed PT), OT (By licensed OT)                    Contractures Contractures Info: Not present    Additional Factors Info  Code Status, Allergies(Unasyn 3 grams IV every 6 hours ( can be done as a 24 hour infusion if needed)End Date: 11/25/19) Code Status Info: Full Allergies Info: Lisinopril           Current Medications (11/04/2019):  This is the current hospital active medication list Current Facility-Administered Medications  Medication Dose Route Frequency Provider Last Rate Last Admin  . acetaminophen (TYLENOL) tablet 650 mg  650 mg Oral Q6H PRN Schnier, Dolores Lory, MD   650 mg at 11/02/19 2138   Or  . acetaminophen (TYLENOL) suppository 650  mg  650 mg Rectal Q6H PRN Schnier, Dolores Lory, MD      . Ampicillin-Sulbactam (UNASYN) 3 g in sodium chloride 0.9 % 100 mL IVPB  3 g Intravenous Q6H Schnier, Dolores Lory, MD 200 mL/hr at 11/04/19 1135 3 g at 11/04/19 1135  . brimonidine (ALPHAGAN) 0.2 % ophthalmic solution 1 drop  1 drop Both Eyes TID Schnier, Dolores Lory, MD   1 drop at 11/04/19 0920   And  . brinzolamide (AZOPT) 1 % ophthalmic suspension 1 drop  1 drop Both Eyes TID [name] Dolores Lory, MD   1 drop at 11/04/19 0921  . calcium-vitamin D (OSCAL WITH D) 500-200 MG-UNIT per tablet 1  tablet  1 tablet Oral BID Schnier, Dolores Lory, MD   1 tablet at 11/04/19 0920  . Chlorhexidine Gluconate Cloth 2 % PADS 6 each  6 each Topical Daily Nicole Kindred A, DO   6 each at 11/04/19 1030  . doxycycline (VIBRA-TABS) tablet 100 mg  100 mg Oral Q12H Samuella Cota, MD   100 mg at 11/04/19 0920  . fluticasone (FLONASE) 50 MCG/ACT nasal spray 2 spray  2 spray Each Nare Daily Schnier, Dolores Lory, MD   2 spray at 11/04/19 [phone]  . guaiFENesin (MUCINEX) 12 hr tablet 600 mg  600 mg Oral BID PRN Samuella Cota, MD   600 mg at 11/03/19 1732  . HYDROcodone-acetaminophen (NORCO/VICODIN) 5-325 MG per tablet 1 tablet  1 tablet Oral Q6H PRN Schnier, Dolores Lory, MD   1 tablet at 11/02/19 1708  . imipramine (TOFRANIL) tablet 25 mg  25 mg Oral QHS Schnier, Dolores Lory, MD   25 mg at 11/03/19 2315  . lip balm (BLISTEX) ointment 1 application  1 application Topical PRN Schnier, Dolores Lory, MD      . magnesium hydroxide (MILK OF MAGNESIA) suspension 30 mL  30 mL Oral Daily PRN Schnier, Dolores Lory, MD      . magnesium oxide (MAG-OX) tablet 400 mg  400 mg Oral Daily Schnier, Dolores Lory, MD   400 mg at 11/04/19 0920  . metoprolol succinate (TOPROL-XL) 24 hr tablet 100 mg  100 mg Oral QHS Schnier, Dolores Lory, MD   100 mg at 11/03/19 2132  . midodrine (PROAMATINE) tablet 5 mg  5 mg Oral TID PC Schnier, Dolores Lory, MD   5 mg at 11/04/19 0920  . mirabegron ER (MYRBETRIQ) tablet 25 mg  25 mg Oral Daily Schnier, Dolores Lory, MD   25 mg at 11/04/19 0920  . morphine 4 MG/ML injection 4 mg  4 mg Intravenous Q3H PRN Schnier, Dolores Lory, MD   4 mg at 11/03/19 0936  . ondansetron (ZOFRAN) tablet 4 mg  4 mg Oral Q6H PRN Schnier, Dolores Lory, MD       Or  . ondansetron [facility]) injection 4 mg  4 mg Intravenous Q6H PRN Schnier, Dolores Lory, MD      . ondansetron [facility]) injection 4 mg  4 mg Intravenous Q6H PRN Schnier, Dolores Lory, MD      . ondansetron [facility]) injection 4 mg  4 mg Intravenous Q6H PRN Schnier, Dolores Lory, MD      .  oxyCODONE (Oxy IR/ROXICODONE) immediate release tablet 10 mg  10 mg Oral Q4H PRN Schnier, Dolores Lory, MD   10 mg at 11/03/19 1732  . traZODone (DESYREL) tablet 25 mg  25 mg Oral QHS PRN Schnier, Dolores Lory, MD         Discharge Medications: Please see discharge  summary for a list of discharge medications.  Relevant Imaging Results:  Relevant Lab Results:   Additional Information ss [id number]  Beverly Sessions, RN

## 2019-11-04 NOTE — Progress Notes (Signed)
Physical Therapy Treatment Patient Details Name: Becky Trevino MRN: [id number] DOB: [birth year] Today's Date: 11/04/2019    History of Present Illness Pt admitted with R LE cellulitis with complaints of confusion and dyspnea. HIstory includes R ankle fx s/p ORIF on 12.18.20, DM, and HTN. Pt with positive DVT, started on anticoag this date.    PT Comments    Pt was long sitting in bed upon arriving. She is A and O x 4 and is cooperative and motivated. Reports discomfort in L hip but improved from previous date. Tolerated wearing CAM boot RLE throughout. Pt agrees to EOB and transfer training. She required Min assist to get to EOB sitting and mod assist to return to supine from sitting. Therapist assisted LEs + with trunk support. Sat EOB x several minutes prior to sit to stand from elevated bed height with max assist + gait belt. Unable to maintain NWB status. She was repositioned in bed post session with call bell in reach and spouse at bedside.  Lengthy discussion about d/c disposition.Both therapist/pt in agreement that she would benefit from SNF to improved abilities prior to returning home. PT has deficits with strength, endurance, balance and overall safe functional mobility which impacts her ability to safety d/c home. PT will continue to follow pt per POC.    Follow Up Recommendations  SNF     Equipment Recommendations  None recommended by PT    Recommendations for Other Services       Precautions / Restrictions Precautions Precautions: Fall Restrictions Weight Bearing Restrictions: Yes RLE Weight Bearing: Non weight bearing    Mobility  Bed Mobility Overal bed mobility: Needs Assistance Bed Mobility: Supine to Sit;Sit to Supine     Supine to sit: Min assist Sit to supine: Mod assist   General bed mobility comments: Increased time to perform + min assist with trunk support/ BLEs assisted to EOB. She required mod assist to return to supine with BLEs. +2 to slide from  FOB to [facility].  Transfers Overall transfer level: Needs assistance Equipment used: Rolling walker (2 wheeled) Transfers: Sit to/from Stand Sit to Stand: Total assist;From elevated surface         General transfer comment: Pt stood 1 x EOB with max assist of one + elevated height. Vcs for improved technique, fwd wt shift, handplacement, and safety.  Ambulation/Gait                 Stairs             Wheelchair Mobility    Modified Rankin (Stroke Patients Only)       Balance                                            Cognition Arousal/Alertness: Awake/alert Behavior During Therapy: WFL for tasks assessed/performed Overall Cognitive Status: Within Functional Limits for tasks assessed                                 General Comments: Pt is A and O x 4      Exercises      General Comments        Pertinent Vitals/Pain Pain Assessment: No/denies pain Pain Score: 0-No pain Faces Pain Scale: Hurts a little bit Pain Location: L thigh Pain Descriptors / Indicators: Guarding Pain Intervention(s):  Monitored during session    Home Living                      Prior Function            PT Goals (current goals can now be found in the care plan section) Acute Rehab PT Goals Patient Stated Goal: to go home Progress towards PT goals: Progressing toward goals    Frequency    Min 2X/week      PT Plan Current plan remains appropriate    Co-evaluation              AM-PAC PT "6 Clicks" Mobility   Outcome Measure  Help needed turning from your back to your side while in a flat bed without using bedrails?: A Little Help needed moving from lying on your back to sitting on the side of a flat bed without using bedrails?: A Little Help needed moving to and from a bed to a chair (including a wheelchair)?: A Lot Help needed standing up from a chair using your arms (e.g., wheelchair or bedside chair)?: A  Lot Help needed to walk in hospital room?: A Lot Help needed climbing 3-5 steps with a railing? : A Lot 6 Click Score: 14    End of Session Equipment Utilized During Treatment: Gait belt Activity Tolerance: Patient tolerated treatment well Patient left: in bed;with call bell/phone within reach;with bed alarm set Nurse Communication: Mobility status PT Visit Diagnosis: Muscle weakness (generalized) (M62.81);Difficulty in walking, not elsewhere classified (R26.2);Unsteadiness on feet (R26.81)     Time: [id number] PT Time Calculation (min) (ACUTE ONLY): 17 min  Charges:  $Therapeutic Activity: 8-22 mins                     Julaine Fusi PTA 11/04/19, 11:39 AM

## 2019-11-04 NOTE — Progress Notes (Signed)
PROGRESS NOTE  Becky Trevino:[id number] DOB: 03/05/1946 DOA: 10/27/2019 PCP: [name], MD  Brief History   74 year old woman PMH diabetes mellitus type 2, right ankle fracture in December post ORIF, presented with confusion and shortness of breath February 11.  Found to have right lower extremity DVT and associated cellulitis.  A & P  Group B streptococcus bacteremia suspected secondary to right lower extremity cellulitis, nonhealing dehiscence prior ORIF site, ascending acinetobacter wound infection --Appears stable.  Afebrile.  Follow by infectious disease with recommendation for Unasyn 4 weeks, end date 11/25/2019 --[facility] Per Protocol:  Labs weeklyon Mondaywhile on IV antibiotics: _X_ CBC with differential __X_ CMP X ESR  _X_ Please pull PIC at completion of IV antibiotics _ Fax weekly labs toDr.[phone]  Clinic Follow Up Appt:in 3 weeks Call [phone] to make appt  Right lower extremity DVT provoked by ankle surgery December 2020 as well as immobility. --Anticoagulation was stopped.  IVC filter was placed.  Seen by hematology.  Coagulopathy evaluation will be followed up in the outpatient setting by hematology.  Spontaneous iliopsoas hematoma secondary to therapeutic Lovenox.  Patient with history of left gluteal hematoma that had to be evacuated by orthopedics in the past while the patient was on Xarelto. --Patient was seen by orthopedics and vascular surgery.  Lovenox was stopped.  IVC filter was placed 2/17. --f/u with Dr. Tasia Catchings as an outpatient --Patient has significant weakness, pain from this hematoma and is not able to function as she was previously at home.  She is now agreeable to SNF.  Right lower extremity wound status post right ankle ORIF December 2020 --Evaluated by surgery, did not appear infected.  Continues to be non weightbearing right lower extremity.  Peripheral vascular disease --Status post right lower  extremity angiogram 2/17  Procedure(s) Performed: 1. Abdominal aortogram 2. Right lower extremity distal runoff third order catheter placement 3. 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 --Discussed with infectious disease.  Doxycycline resumed.  AKI superimposed on CKD stage IIIa thought secondary to dehydration --Resolved  Diabetes mellitus type 2, apparently diet controlled. --1 episode of borderline hypoglycemia. --Not on insulin.  Follow clinically.  Disposition Plan: From home.  Significantly deconditioned secondary to acute hematoma limiting left leg strength and nonweightbearing status on the right secondary to ankle fracture.  She would benefit from short-term SNF to promote rapid return to previous functional status.  She does not appear to be safe to go home prior.  Await bed offers some patient selection of a bed.  Possible discharge 2/20 if bed secured.  DVT prophylaxis: TED hose Code Status: Full Family Communication: husband at bedside    Murray Hodgkins, MD  [facility] Direct contact: see [email or web address] (further directions at bottom of note if needed) 7PM-7AM contact night coverage as at bottom of note 11/04/2019, 5:06 PM  LOS: 8 days    Interval History/Subjective  Feels better today.  Better movement with the left leg.  Worked with physical therapy today, SNF was again recommended and the patient has now agreed to this.  Objective   Vitals:  Vitals:   11/04/19 0438 11/04/19 1502  BP: 108/60 137/79  Pulse: 76 83  Resp: 16 20  Temp: 98 F (36.7 C) 98.4 F (36.9 C)  SpO2: 96% 97%    Exam:  Constitutional.  Appears calm, comfortable.  Appears better today. Respiratory.  Clear to auscultation bilaterally.  No  wheezes, rales or rhonchi.  Normal respiratory effort. Cardiovascular.  Regular rate  and rhythm.  No murmur, rub or gallop. Musculoskeletal.  Improved movement with the left leg. Psychiatric.  Grossly normal mood and affect.  Speech fluent and appropriate.  I have personally reviewed the following:   Today's Data  . Borderline hypoglycemia 68 this morning. . Basic metabolic panel notable for potassium 3.4, magnesium within normal limits. . Hemoglobin stable at 9.6.  Scheduled Meds: . brimonidine  1 drop Both Eyes TID   And  . brinzolamide  1 drop Both Eyes TID  . calcium-vitamin D  1 tablet Oral BID  . Chlorhexidine Gluconate Cloth  6 each Topical Daily  . doxycycline  100 mg Oral Q12H  . fluticasone  2 spray Each Nare Daily  . imipramine  25 mg Oral QHS  . magnesium oxide  400 mg Oral Daily  . metoprolol succinate  100 mg Oral QHS  . midodrine  5 mg Oral TID PC  . mirabegron ER  25 mg Oral Daily   Continuous Infusions: . ampicillin-sulbactam (UNASYN) IV 3 g (11/04/19 1135)    Principal Problem:   Bacteremia due to group B Streptococcus Active Problems:   Chronic kidney disease (CKD), stage III (moderate)   Type 2 diabetes mellitus with diabetic neuropathy, without long-term current use of insulin (HCC)   Cellulitis of right lower extremity   Acute deep vein thrombosis (DVT) of right lower extremity (HCC)   Acute pain of left thigh   LOS: 8 days   How to contact the [facility] Attending or Consulting provider 7A - 7P or covering provider during after hours [place], for this patient?  1. Check the care team in [facility] and look for a) attending/consulting TRH provider listed and b) the [facility] team listed 2. Log into [email or web address] and use [place]'s universal password to access. If you do not have the password, please contact the hospital operator. 3. Locate the [facility] provider you are looking for under [facility] and page to a number that you can be directly reached. 4. If you still have difficulty reaching the provider, please page the [facility] (Director on Call) for  the Hospitalists listed on amion for assistance.

## 2019-11-05 ENCOUNTER — Encounter: Payer: Self-pay | Admitting: Family Medicine

## 2019-11-05 DIAGNOSIS — S7012XA Contusion of left thigh, initial encounter: Secondary | ICD-10-CM

## 2019-11-05 LAB — GLUCOSE, CAPILLARY
Glucose-Capillary: 84 mg/dL (ref 70–99)
Glucose-Capillary: 85 mg/dL (ref 70–99)
Glucose-Capillary: 86 mg/dL (ref 70–99)
Glucose-Capillary: 88 mg/dL (ref 70–99)
Glucose-Capillary: 96 mg/dL (ref 70–99)
Glucose-Capillary: 99 mg/dL (ref 70–99)

## 2019-11-05 LAB — RESPIRATORY PANEL BY RT PCR (FLU A&B, COVID)
Influenza A by PCR: NEGATIVE
Influenza B by PCR: NEGATIVE
SARS Coronavirus 2 by RT PCR: NEGATIVE

## 2019-11-05 MED ORDER — HYDROCODONE-ACETAMINOPHEN 5-325 MG PO TABS: 1.0000 | ORAL_TABLET | Freq: Four times a day (QID) | ORAL | 0 refills | Status: DC | PRN

## 2019-11-05 MED ORDER — AMPICILLIN-SULBACTAM IV (FOR PTA / DISCHARGE USE ONLY): 3.0000 g | Freq: Four times a day (QID) | INTRAVENOUS | 0 refills | Status: AC

## 2019-11-05 NOTE — TOC Progression Note (Signed)
Transition of Care [facility]) - Progression Note    Patient Details  Name: Becky Trevino MRN: [id number] Date of Birth: 1946/05/26  Transition of Care [facility]) CM/SW Contact  Truitt Merle, LCSW Phone Number: 11/05/2019, 12:34 PM  Clinical Narrative:    Spoke with patient today at bedside to provide update on bed offer/no additional offers made. Patient agreeable to [facility]). Spoke with Claiborne Billings in admissions at [facility], who stated they would not be able to accept patient today due to needing insurance authorization. Claiborne Billings stated patient's lnsurance Josem Kaufmann can not be started until Monday, as [employer] s closed on weekends. Patient, Dr. Sarajane Jews, and RN updated. TOC continuing to follow for transition of care/discharge planning.    Expected Discharge Plan: [place] Barriers to Discharge: Insurance Authorization  Expected Discharge Plan and Services Expected Discharge Plan: [place] arrangements for the past 2 months: Single Family Home Expected Discharge Date: 11/05/19                                     Social Determinants of Health (SDOH) Interventions    Readmission Risk Interventions No flowsheet data found.

## 2019-11-05 NOTE — Plan of Care (Signed)
  Problem: Education: Goal: Knowledge of General Education information will improve Description: Including pain rating scale, medication(s)/side effects and non-pharmacologic comfort measures 11/05/2019 1120 by Blenda Bridegroom, RN Outcome: Completed/Met 11/05/2019 0812 by Blenda Bridegroom, RN Outcome: Progressing   Problem: Health Behavior/Discharge Planning: Goal: Ability to manage health-related needs will improve 11/05/2019 1120 by Blenda Bridegroom, RN Outcome: Completed/Met 11/05/2019 0812 by Blenda Bridegroom, RN Outcome: Progressing   Problem: Clinical Measurements: Goal: Ability to maintain clinical measurements within normal limits will improve 11/05/2019 1120 by Blenda Bridegroom, RN Outcome: Completed/Met 11/05/2019 0812 by Blenda Bridegroom, RN Outcome: Progressing Goal: Will remain free from infection 11/05/2019 1120 by Blenda Bridegroom, RN Outcome: Completed/Met 11/05/2019 0812 by Blenda Bridegroom, RN Outcome: Progressing Goal: Diagnostic test results will improve 11/05/2019 1120 by Blenda Bridegroom, RN Outcome: Completed/Met 11/05/2019 0812 by Blenda Bridegroom, RN Outcome: Progressing Goal: Respiratory complications will improve 11/05/2019 1120 by Blenda Bridegroom, RN Outcome: Completed/Met 11/05/2019 0812 by Blenda Bridegroom, RN Outcome: Progressing Goal: Cardiovascular complication will be avoided 11/05/2019 1120 by Blenda Bridegroom, RN Outcome: Completed/Met 11/05/2019 0812 by Blenda Bridegroom, RN Outcome: Progressing   Problem: Activity: Goal: Risk for activity intolerance will decrease 11/05/2019 1120 by Blenda Bridegroom, RN Outcome: Completed/Met 11/05/2019 6015 by Blenda Bridegroom, RN Outcome: Progressing   Problem: Nutrition: Goal: Adequate nutrition will be maintained 11/05/2019 1120 by Blenda Bridegroom, RN Outcome: Completed/Met 11/05/2019 6153 by Blenda Bridegroom, RN Outcome: Progressing   Problem: Coping: Goal: Level of anxiety will decrease 11/05/2019 1120 by Blenda Bridegroom, RN Outcome:  Completed/Met 11/05/2019 7943 by Blenda Bridegroom, RN Outcome: Progressing   Problem: Elimination: Goal: Will not experience complications related to bowel motility 11/05/2019 1120 by Blenda Bridegroom, RN Outcome: Completed/Met 11/05/2019 2761 by Blenda Bridegroom, RN Outcome: Progressing Goal: Will not experience complications related to urinary retention 11/05/2019 1120 by Blenda Bridegroom, RN Outcome: Completed/Met 11/05/2019 4709 by Blenda Bridegroom, RN Outcome: Progressing   Problem: Pain Managment: Goal: General experience of comfort will improve 11/05/2019 1120 by Blenda Bridegroom, RN Outcome: Completed/Met 11/05/2019 2957 by Blenda Bridegroom, RN Outcome: Progressing   Problem: Safety: Goal: Ability to remain free from injury will improve 11/05/2019 1120 by Blenda Bridegroom, RN Outcome: Completed/Met 11/05/2019 4734 by Blenda Bridegroom, RN Outcome: Progressing   Problem: Skin Integrity: Goal: Risk for impaired skin integrity will decrease 11/05/2019 1120 by Blenda Bridegroom, RN Outcome: Completed/Met 11/05/2019 0812 by Blenda Bridegroom, RN Outcome: Progressing

## 2019-11-05 NOTE — Plan of Care (Signed)

## 2019-11-05 NOTE — Discharge Summary (Signed)
Physician Discharge Summary  Becky Trevino XHB:[id number] DOB: [birth year] DOA: 10/27/2019  PCP: [name], MD  Admit date: 10/27/2019 Discharge date: 11/05/2019  Recommendations for Outpatient Follow-up:   Group B streptococcus bacteremia suspected secondary to right lower extremity cellulitis, nonhealing dehiscence prior ORIF site, ascendingacinetobacter woundinfection --Remains stable and afebrile. Follow by infectious disease with recommendation for Unasyn weeks, end date 11/25/2019 --[facility] Care Per Protocol:  Labs weeklyon Mondaywhile on IV antibiotics: _X_ CBC with differential __X_ CMP X ESR  _X_ Please pull PIC at completion of IV antibiotics _ Fax weekly labs toDr.[phone]  Clinic Follow Up Appt:in 3 weeks Call [phone] to make appt  Right lower extremity wound status post right ankle ORIF December 2020    Follow-up Information    [name], DPM. Schedule an appointment as soon as possible for a visit in 1 week(s).   Specialty: Podiatry Contact information: [place] [address] [phone]        [name], Dolores Lory, MD Follow up in 1 month(s).   Specialties: Vascular Surgery, Cardiology, Radiology, Vascular Surgery Why: s/p IVC filter insertion. No studies.  Contact information: [place] [address] [phone]        [name], MD. Schedule an appointment as soon as possible for a visit in 2 week(s).   Specialty: Infectious Diseases Contact information: [place] [address] [phone]        [name], MD. Schedule an appointment as soon as possible for a visit in 4 week(s).   Specialty: Oncology Contact information: [place] [address] [phone]            Discharge Diagnoses: Principal diagnosis is #1 1. Group B streptococcus bacteremia suspected secondary to right lower extremity cellulitis,  nonhealing dehiscence prior ORIF site, ascendingacinetobacter woundinfection 2. Right lower extremity DVT 3. Spontaneous iliopsoas hematomasecondary to therapeutic Lovenox.  4. Right lower extremity wound status post right ankle ORIF  5. P atherosclerotic occlusive disease bilateral lower extremities  6. 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 7. AKI superimposed on CKD stage IIIa 8. Diabetes mellitus type 2, apparently diet controlled.   Discharge Condition: improved Disposition: short-term SNF  Diet recommendation: heart healthy  Filed Weights   10/27/19 2108 10/28/19 0053  Weight: 114.8 kg 116.2 kg    History of present illness:  74 year old woman PMH diabetes mellitus type 2, right ankle fracture in December post ORIF, presented with confusion and shortness of breath February 11. Found to have right lower extremity DVT and associated cellulitis.    Hospital Course:  Patient was found to have group B streptococcus bacteremia secondary to right lower extremities myelitis and nonhealing dehiscence ORIF, concern for hardware infection.  Seen by infectious disease with recommendation for 4 weeks of IV antibiotics, details below.  Was treated with anticoagulation for right lower extremity DVT but developed spontaneous left iliopsoas hematoma with significant pain, therefore anticoagulation was stopped.  Given previous spontaneous hematoma last year, decision was made to place IVC filter.  She is evaluated by surgery and the wound did not appear to be grossly infected and therefore conservative management was recommended.  She continues to be nonweight bearing to the right lower extremity.  She was also seen by vascular surgery to address circulation right lower extremity and underwent procedure as listed below.  She was evaluated by physical therapy and recommended for SNF given nonweightbearing status of  the right leg and new iliopsoas hematoma resulting in left lower extremity weakness and pain.  Individual details as below.  Group B streptococcus bacteremia suspected secondary to right lower extremity cellulitis, nonhealing dehiscence prior ORIF site, ascendingacinetobacter woundinfection --Remains stable and afebrile. Follow by infectious disease with recommendation for Unasyn weeks, end date 11/25/2019 --[facility] Care Per Protocol:  Labs weeklyon Mondaywhile on IV antibiotics: _X_ CBC with differential __X_ CMP X ESR  _X_ Please pull PIC at completion of IV antibiotics _ Fax weekly labs toDr.[phone]  Clinic Follow Up Appt:in 3 weeks Call [phone] to make appt  Right lower extremity DVTprovoked by ankle surgery December 2020 as well as immobility. --Anticoagulation was stopped when patient developed spontaneous iliopsoas hematoma on the left. IVC filter was placed by vascular surgery. Seen by hematology.Coagulopathy evaluation will be followed up in the outpatient setting by hematology.  Spontaneous iliopsoas hematomasecondary to therapeutic Lovenox. Patient with history of left gluteal hematoma on a prior hospitalization that had to be evacuated by orthopedics in the past while the patient was on Xarelto. --Patient was seen by orthopedics and vascular surgery. Lovenox was stopped. IVC filter was placed2/17.  Orthopedics felt that her symptoms would improve with time.  In the they have improved during this hospitalization. --f/u with Dr. Tasia Catchings as an outpatient --Patient has significant weakness, pain from this hematoma and is not able to function as she was previously at home.  She is agreeable to SNF.  Right lower extremity wound status post right ankle ORIF December 2020 --Evaluated by surgery, did not appear infected. Continues to be non weightbearing right lower extremity.  Peripheral vascular disease --Status post right lower extremity  angiogram 2/17 Procedure(s) Performed: 1. Abdominal aortogram 2. Right lower extremity distal runoff third order catheter placement 3. 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 --Discussed with infectious disease.    Continue doxycycline.  AKI superimposed on CKD stage IIIathought secondary to dehydration --Resolved  Diabetes mellitus type 2, apparently diet controlled. --Stable.  Not on any medications.  Today's assessment: S: Feels well today, no complaints.  Left leg seems to be improving.  Some thigh numbness.  Able to move leg. O: Vitals:  Vitals:   11/04/19 1958 11/05/19 0553  BP: 116/88 139/60  Pulse: 85 74  Resp:  18  Temp: 98.4 F (36.9 C) 98.2 F (36.8 C)  SpO2: 99% 96%    Constitutional:  . Appears calm and comfortable.  Appears better today. Respiratory:  . CTA bilaterally, no w/r/r.  . Respiratory effort normal.  Cardiovascular:  . RRR, no m/r/g . No LE extremity edema   Musculoskeletal:  . RLE, LLE   . Able to move both lower extremities to command.  Sensation left extremity intact. Psychiatric:  . Mental status o Mood, affect appropriate  CBG stable  Discharge Instructions  Discharge Instructions    Home infusion instructions   Complete by: As directed    Instructions: Flushing of vascular access device: 0.9% NaCl pre/post medication administration and prn patency; Heparin 100 u/ml, 33m for implanted ports and Heparin 10u/ml, 570mfor all other central venous catheters.     Allergies as of 11/05/2019      Reactions   Lisinopril Swelling      Medication List    STOP taking these medications   celecoxib 200 MG capsule Commonly known as: CELEBREX   enoxaparin 40 MG/0.4ML injection Commonly known as: LOVENOX  gabapentin 300 MG capsule Commonly known as: NEURONTIN   oxycodone 5 MG  capsule Commonly known as: OXY-IR   oxyCODONE-acetaminophen 5-325 MG tablet Commonly known as: PERCOCET/ROXICET   oxyCODONE-acetaminophen 7.5-325 MG tablet Commonly known as: PERCOCET   traZODone 50 MG tablet Commonly known as: DESYREL   Xarelto 20 MG Tabs tablet Generic drug: rivaroxaban     TAKE these medications   acetaminophen 650 MG CR tablet Commonly known as: TYLENOL Take 650-1,300 mg by mouth 2 (two) times daily as needed for pain.   ampicillin-sulbactam  IVPB Commonly known as: UNASYN Inject 3 g into the vein every 6 (six) hours for 23 days. Indication:  GBS Bacteremia and right leg hardware infection, Acinetobacter wound infection Last Day of Therapy:  11/25/2019 [facility] Care Per Protocol:  Labs weekly on Monday while on IV antibiotics: _X_ CBC with differential __X_ CMP X ESR  _X_ Please pull PIC at completion of IV antibiotics _ Fax weekly labs to [place]  [phone]  Clinic Follow Up Appt:in 3 weeks  Call [phone] to make appt   atorvastatin 40 MG tablet Commonly known as: LIPITOR Take 1 tablet (40 mg total) by mouth daily at 6 PM. What changed: when to take this   Calcium 600-D 600-400 MG-UNIT tablet Generic drug: Calcium Carbonate-Vitamin D Take 1 tablet by mouth 2 (two) times daily.   doxycycline 100 MG capsule Commonly known as: VIBRAMYCIN Take 100 mg by mouth 2 (two) times daily.   fluticasone 50 MCG/ACT nasal spray Commonly known as: FLONASE Place 2 sprays into both nostrils daily.   furosemide 20 MG tablet Commonly known as: LASIX Take 20 mg by mouth every evening.   glucose blood test strip Commonly known as: ONE TOUCH ULTRA TEST Use as instructed   HYDROcodone-acetaminophen 5-325 MG tablet Commonly known as: NORCO/VICODIN Take 1 tablet by mouth every 6 (six) hours as needed for moderate pain or severe pain.   imipramine 25 MG tablet Commonly known as: TOFRANIL Take 1 tablet (25 mg total) by mouth at bedtime.     ketoconazole 2 % cream Commonly known as: NIZORAL Apply 1 application topically 2 (two) times daily as needed (rash).   Magnesium Oxide 500 MG Caps Take 500 mg by mouth daily.   metoprolol succinate 100 MG 24 hr tablet Commonly known as: TOPROL-XL Take 100 mg by mouth at bedtime.   midodrine 5 MG tablet Commonly known as: PROAMATINE Take 1 tablet (5 mg total) by mouth 3 (three) times daily with meals. What changed: when to take this   mirabegron ER 25 MG Tb24 tablet Commonly known as: Myrbetriq Take 1 tablet (25 mg total) by mouth daily.   potassium chloride 10 MEQ tablet Commonly known as: KLOR-CON Take 10 mEq by mouth daily.   Rocklatan 0.02-0.005 % Soln Generic drug: Netarsudil-Latanoprost Place 1 drop into both eyes at bedtime.   Simbrinza 1-0.2 % Susp Generic drug: Brinzolamide-Brimonidine Place 1 drop into both eyes 2 (two) times daily.            Home Infusion Instuctions  (From admission, onward)         Start     Ordered   11/05/19 0000  Home infusion instructions    Question:  Instructions  Answer:  Flushing of vascular access device: 0.9% NaCl pre/post medication administration and prn patency; Heparin 100 u/ml, 36m for implanted ports and Heparin 10u/ml, 552mfor all other central venous catheters.   11/05/19 1108  Allergies  Allergen Reactions  . Lisinopril Swelling    The results of significant diagnostics from this hospitalization (including imaging, microbiology, ancillary and laboratory) are listed below for reference.    Significant Diagnostic Studies: DG Lumbar Spine 2-3 Views  Result Date: 10/31/2019 CLINICAL DATA:  Left thigh pain EXAM: LUMBAR SPINE - 2-3 VIEW COMPARISON:  None. FINDINGS: Five lumbar type vertebral bodies are well visualized. Vertebral body height is well maintained. Degenerative anterolisthesis of L4 on L5 is noted. Facet hypertrophic changes are seen. Changes of prior left hip replacement are noted.  Degenerative changes of the right hip are seen. Aortic calcifications are noted. IMPRESSION: Degenerative change with anterolisthesis of L4 on L5. Electronically Signed   By: Inez Catalina M.D.   On: 10/31/2019 16:41   DG Ankle 2 Views Right  Result Date: 10/28/2019 CLINICAL DATA:  The patient suffered a distal fibular fracture due to a fall in December, 2020. The patient underwent ORIF 09/02/2019 and now has developed swelling, warmth and tenderness about the surgical site. EXAM: RIGHT ANKLE - 2 VIEW COMPARISON:  Intraoperative imaging for fracture fixation 09/02/2019. FINDINGS: Plate and screw fixation of a distal fibular fracture is identified. Position and alignment are anatomic. Subtle lucency about fixation screws is most notable about the 3rd-6th screws from the top and worrisome for infection or loosening. Soft tissues about the ankle are swollen. No soft tissue gas is identified. Vac drain is in place. IMPRESSION: Status post fixation of a distal fibular fracture. There is subtle lucency about multiple fixation screws worrisome for loosening or infection. Soft tissue swelling about the ankle without soft tissue gas. Electronically Signed   By: Inge Rise M.D.   On: 10/28/2019 09:29   CT Head Wo Contrast  Result Date: 10/27/2019 CLINICAL DATA:  Encephalopathy EXAM: CT HEAD WITHOUT CONTRAST TECHNIQUE: Contiguous axial images were obtained from the base of the skull through the vertex without intravenous contrast. COMPARISON:  Head CT 08/07/2019 FINDINGS: Brain: There is no mass, hemorrhage or extra-axial collection. The size and configuration of the ventricles and extra-axial CSF spaces are normal. The brain parenchyma is normal, without acute or chronic infarction. Vascular: No abnormal hyperdensity of the major intracranial arteries or dural venous sinuses. No intracranial atherosclerosis. Skull: Craniocervical fusion. Sinuses/Orbits: No fluid levels or advanced mucosal thickening of the  visualized paranasal sinuses. No mastoid or middle ear effusion. The orbits are normal. IMPRESSION: Normal brain. Electronically Signed   By: Ulyses Jarred M.D.   On: 10/27/2019 22:18   CT ABDOMEN PELVIS W CONTRAST  Result Date: 11/01/2019 CLINICAL DATA:  Hip pain, abscess suspected EXAM: CT ABDOMEN AND PELVIS WITH CONTRAST TECHNIQUE: Multidetector CT imaging of the abdomen and pelvis was performed using the standard protocol following bolus administration of intravenous contrast. CONTRAST:  178m OMNIPAQUE IOHEXOL 300 MG/ML  SOLN COMPARISON:  Left hip, 1,021 abdomen pelvis, 08/07/2019 FINDINGS: Lower chest: No acute abnormality. Dependent bibasilar scarring and/or atelectasis. Left coronary artery calcifications. Hepatobiliary: No solid liver abnormality is seen. Gallstones and/or sludge in the gallbladder. No gallbladder wall thickening, or biliary dilatation. Pancreas: Unremarkable. No pancreatic ductal dilatation or surrounding inflammatory changes. Spleen: Normal in size without significant abnormality. Adrenals/Urinary Tract: Adrenal glands are unremarkable. Kidneys are normal, without renal calculi, solid lesion, or hydronephrosis. Bladder is unremarkable. Stomach/Bowel: Stomach is within normal limits. Incidental duodenal diverticulum. Appendix appears normal. No evidence of bowel wall thickening, distention, or inflammatory changes. Vascular/Lymphatic: Aortic atherosclerosis. No enlarged abdominal or pelvic lymph nodes. Reproductive: No mass or other significant  abnormality. Other: No abdominal wall hernia or abnormality. No abdominopelvic ascites. Musculoskeletal: Status post left hip total arthroplasty. Severe arthrosis and subchondral cyst formation of the right hip. There is a large hematoma within the left iliacus muscle body and conjoint tendon of the iliopsoas, measuring at least 6.8 x 3.7 x 13.8 cm (series 3, image 64, series 6, image 119). Assessment of the inferior portion is difficult due to  dense streak artifact from adjacent hip arthroplasty. There is a superficial fluid collection overlying the left gluteus musculature, which is substantially decreased in size compared to prior CT dated 08/07/2019, measuring 5.0 x 3.3 cm (series 2, image 5). IMPRESSION: 1. Large hematoma within the left iliacus muscle body and conjoint tendon of the iliopsoas, measuring at least 6.8 x 3.7 x 13.8 cm. Assessment of the inferior portion is difficult due to dense streak artifact from adjacent hip arthroplasty. There is no obvious tendon retraction or associated fracture. 2. Superficial fluid collection overlying the left gluteus musculature is substantially decreased in size compared to prior CT dated 08/07/2019, measuring 5.0 x 3.3 cm, consistent with residual hematoma or seroma. 3. Cholelithiasis without evidence of cholecystitis. 4. Coronary artery disease.  Aortic Atherosclerosis (ICD10-I70.0). Electronically Signed   By: Eddie Candle M.D.   On: 11/01/2019 17:13   CT ANKLE RIGHT W CONTRAST  Result Date: 10/31/2019 CLINICAL DATA:  Dehiscence of the surgical wound along the right lateral ankle. Prior plate and screw fixation of the lateral malleolus. EXAM: CT OF THE RIGHT ANKLE WITH CONTRAST TECHNIQUE: Multidetector CT imaging of the right ankle was performed following the standard protocol during bolus administration of intravenous contrast. CONTRAST:  132m OMNIPAQUE IOHEXOL 300 MG/ML  SOLN COMPARISON:  Radiographs from 10/28/2019 FINDINGS: Bones/Joint/Cartilage Lateral malleolar plate and screw fixator traversing the oblique fracture, with lack of bony bridging and early cortication along the fracture plane favoring early nonunion. Concern was raised on radiography for lucency around the screws but currently no abnormal lucency is observed around the 5 screws proximal to the fracture or 2 screws distal to the fracture to favor loosening or infection. No bony destructive findings are identified. Small fragments  along the anterior margin of the fracture plane indicate mild prior comminution There is periosteal reaction along the posterior tibial metaphysis along with a corticated ossicle along the posteroinferior tibial rim indicating a likely small posterior malleolar fracture component of the prior injury, favoring at least a stage III Weber B injury. There is a small ossicle below the medial malleolus but it appears well corticated and does not encompass the entire attachment of the deep tibiotalar portion of the deltoid ligament., which grossly appears intact, implying that this was probably not a stage IV Weber B injury. Mild irregular spurring with some underlying lucency in the medial talar dome posteriorly for example on images 80-85 of series 6, favoring degenerative spurring and potentially a small sagittally oriented non-fragmented chronic osteochondral injury. Degenerative arthropathy of the Lisfranc joint medially; only part of the Lisfranc joint is included on today's examination but there is clearly considerable spurring and loss of articular space between the medial cuneiform and base of the first metatarsal, and the middle cuneiform and the base of the second metatarsal. Please note that the Lisfranc joint is not included in its entirety. Mild articular surface irregularity along the posterior tibial plafond for example on image 44/7. Plantar calcaneal spur noted. Ligaments Suboptimally assessed by CT. My sense on image 83/9 is that the deep tibiotalar portion of the deltoid ligament  is probably intact, although there is some indistinctness and surrounding edema. The tibionavicular and tibiospring portions of deltoid ligament are obscured by surrounding edema. Small bony fragments just above the expected vicinity of the anterior talofibular ligament. Muscles and Tendons No flexor tendon entrapment. Expanded contour of the tibialis posterior tendon compatible with tendinopathy. Soft tissues Subcutaneous  edema surrounding the ankle and tracking in the dorsum of the foot and the heel. There is some confluent mild cutaneous irregularity overlying the lateral plate and screw fixator for example on image 51/8 at least partially from overlying bandaging, but reportedly this is the region of the cutaneous wound. I do not see any gas tracking in the soft tissues. I do not observe a drainable rim enhancing fluid collection to suggest abscess. IMPRESSION: 1. Lateral malleolar plate and screw fixator traversing the oblique fracture, with lack of bony bridging and early cortication along the fracture plane favoring early nonunion. There is also some periosteal reaction along the posterior tibial metaphysis compatible with a small posterior malleolar fracture component of the prior injury. Appearance compatible with previous Weber B stage III fracture. 2. No current lucency along the screws of the plate and screw fixator to suggest loosening or infection around the screws. 3. Small ossicle below the medial malleolus appears well corticated and does not encompass the entire attachment of the deep tibiotalar portion of the deltoid ligament. 4. Degenerative arthropathy of the Lisfranc joint medially, only part of the Lisfranc joint is included on today's examination. 5. Plantar calcaneal spur. 6. Expanded contour of the tibialis posterior tendon compatible with tendinopathy. Correlate clinically in assessing for tibialis posterior dysfunction. 7. Subcutaneous edema surrounding the ankle and tracking in the dorsum of the foot and heel. No drainable abscess is identified. No gas tracking in the soft tissues. 8. Mild articular surface irregularity along the posterior tibial plafond, query a small sagittally oriented non-fragmented chronic osteochondral injury. Electronically Signed   By: Van Clines M.D.   On: 10/31/2019 08:23   CT FEMUR LEFT W CONTRAST  Result Date: 10/31/2019 CLINICAL DATA:  Acute onset of left thigh  pain. Remote left hip arthroplasty. EXAM: CT OF THE LOWER RIGHT EXTREMITY WITH CONTRAST TECHNIQUE: Multidetector CT imaging of the lower right extremity was performed according to the standard protocol following intravenous contrast administration. COMPARISON:  Radiographs, same date. CONTRAST:  153m OMNIPAQUE IOHEXOL 300 MG/ML  SOLN FINDINGS: The left hip prosthesis is intact. No findings suspicious for periprosthetic fracture or loosening. No dislocation. The left femur is intact. A total left knee arthroplasty is noted. No obvious complicating features. Marked enlargement of the left iliopsoas muscle suggesting hematoma, possible spontaneous bleed or tear, if there was trauma. This is likely causing the patient's left hip pain. There is also some subcutaneous edema or hemorrhage in the lateral hip area. No significant intrapelvic abnormalities are identified. The pubic symphysis and pubic bones are intact. Advanced degenerative changes are noted involving the right hip. IMPRESSION: 1. Marked enlargement of the left iliopsoas muscle suggesting hematoma, possible spontaneous bleed or tear, if there was trauma. 2. Intact left hip prosthesis and no findings suspicious for periprosthetic fracture or loosening. 3. No significant intrapelvic abnormalities. 4. Advanced degenerative changes involving the right hip. Electronically Signed   By: PMarijo SanesM.D.   On: 10/31/2019 16:46   PERIPHERAL VASCULAR CATHETERIZATION  Result Date: 11/02/2019 See op note  UKoreaVenous Img Lower Unilateral Right  Result Date: 10/27/2019 CLINICAL DATA:  Erythema and edema EXAM: Right LOWER EXTREMITY VENOUS  DOPPLER ULTRASOUND TECHNIQUE: Gray-scale sonography with graded compression, as well as color Doppler and duplex ultrasound were performed to evaluate the lower extremity deep venous systems from the level of the common femoral vein and including the common femoral, femoral, profunda femoral, popliteal and calf veins including  the posterior tibial, peroneal and gastrocnemius veins when visible. The superficial great saphenous vein was also interrogated. Spectral Doppler was utilized to evaluate flow at rest and with distal augmentation maneuvers in the common femoral, femoral and popliteal veins. COMPARISON:  None FINDINGS: Contralateral Common Femoral Vein: Respiratory phasicity is normal and symmetric with the symptomatic side. No evidence of thrombus. Normal compressibility. Common Femoral Vein: No evidence of thrombus. Normal compressibility, respiratory phasicity and response to augmentation. Saphenofemoral Junction: There is occlusive thrombus at the junction of the saphenous vein with the common femoral vein. Profunda Femoral Vein: No evidence of thrombus. Normal compressibility and flow on color Doppler imaging. Femoral Vein: No evidence of thrombus. Normal compressibility, respiratory phasicity and response to augmentation. Popliteal Vein: There is occlusive thrombus within the right popliteal vein. Calf Veins: No evidence of thrombus. Normal compressibility and flow on color Doppler imaging. Superficial Great Saphenous Vein: No evidence of thrombus. Normal compressibility. Venous Reflux:  None. Other Findings:  None. IMPRESSION: 1. Deep venous thrombus within the right popliteal vein, with superficial thrombus seen at the saphenofemoral junction. These results will be called to the ordering clinician or representative by the Radiologist Assistant, and communication documented in the PACS or zVision Dashboard. Electronically Signed   By: Randa Ngo M.D.   On: 10/27/2019 23:04   DG Chest Port 1 View  Result Date: 10/27/2019 CLINICAL DATA:  Altered level of consciousness, sepsis EXAM: PORTABLE CHEST 1 VIEW COMPARISON:  08/07/2019 FINDINGS: Single frontal view of the chest was performed. Lung volumes are diminished, with crowding of the pulmonary vasculature. No definite airspace disease, effusion, or pneumothorax. The  cardiac silhouette is stable. IMPRESSION: 1. Low lung volumes with crowding of the pulmonary vasculature. 2. No acute airspace disease. Electronically Signed   By: Randa Ngo M.D.   On: 10/27/2019 22:40   DG HIP UNILAT WITH PELVIS 2-3 VIEWS LEFT  Result Date: 10/31/2019 CLINICAL DATA:  Left thigh pain EXAM: DG HIP (WITH OR WITHOUT PELVIS) 2-3V LEFT COMPARISON:  08/07/2019 FINDINGS: Pelvic ring is intact. Changes of prior left hip replacement are noted. Significant degenerative changes in the right hip are seen. No soft tissue abnormality is seen. IMPRESSION: Chronic changes without acute abnormality. Electronically Signed   By: Inez Catalina M.D.   On: 10/31/2019 16:42    Microbiology: Recent Results (from the past 240 hour(s))  Culture, blood (routine x 2)     Status: Abnormal   Collection Time: 10/27/19  9:18 PM   Specimen: BLOOD  Result Value Ref Range Status   Specimen Description   Final    BLOOD LEFT ANTECUBITAL Performed at [facility], [address], [place], [place] [address]    Special Requests   Final    BOTTLES DRAWN AEROBIC AND ANAEROBIC Blood Culture adequate volume Performed at [facility], [address], [place], [place] [address]    Culture  Setup Time   Final    GRAM POSITIVE COCCI IN BOTH AEROBIC AND ANAEROBIC BOTTLES CRITICAL RESULT CALLED TO, READ BACK BY AND VERIFIED WITH: KRISTEN MERRILL AT 1005 10/28/19.PMF Performed at [facility], [place] [address], [place], [place] [address]    Culture GROUP B STREP(S.AGALACTIAE)ISOLATED (A)  Final  Report Status 10/30/2019 FINAL  Final   Organism ID, Bacteria GROUP B STREP(S.AGALACTIAE)ISOLATED  Final      Susceptibility   Group b strep(s.agalactiae)isolated - MIC*    CLINDAMYCIN <=0.25 SENSITIVE Sensitive     AMPICILLIN <=0.25 SENSITIVE Sensitive     ERYTHROMYCIN <=0.12 SENSITIVE Sensitive     VANCOMYCIN 0.5 SENSITIVE Sensitive     CEFTRIAXONE <=0.12 SENSITIVE Sensitive     LEVOFLOXACIN  1 SENSITIVE Sensitive     * GROUP B STREP(S.AGALACTIAE)ISOLATED  Blood Culture ID Panel (Reflexed)     Status: Abnormal   Collection Time: 10/27/19  9:18 PM  Result Value Ref Range Status   Enterococcus species NOT DETECTED NOT DETECTED Final   Listeria monocytogenes NOT DETECTED NOT DETECTED Final   Staphylococcus species NOT DETECTED NOT DETECTED Final   Staphylococcus aureus (BCID) NOT DETECTED NOT DETECTED Final   Streptococcus species DETECTED (A) NOT DETECTED Final    Comment: CRITICAL RESULT CALLED TO, READ BACK BY AND VERIFIED WITH: KRISTEN MERRILL AT 1005 10/28/19.PMF    Streptococcus agalactiae DETECTED (A) NOT DETECTED Final    Comment: CRITICAL RESULT CALLED TO, READ BACK BY AND VERIFIED WITH: KRISTEN MERRILL AT 1005 10/28/19.PMF    Streptococcus pneumoniae NOT DETECTED NOT DETECTED Final   Streptococcus pyogenes NOT DETECTED NOT DETECTED Final   Acinetobacter baumannii NOT DETECTED NOT DETECTED Final   Enterobacteriaceae species NOT DETECTED NOT DETECTED Final   Enterobacter cloacae complex NOT DETECTED NOT DETECTED Final   Escherichia coli NOT DETECTED NOT DETECTED Final   Klebsiella oxytoca NOT DETECTED NOT DETECTED Final   Klebsiella pneumoniae NOT DETECTED NOT DETECTED Final   Proteus species NOT DETECTED NOT DETECTED Final   Serratia marcescens NOT DETECTED NOT DETECTED Final   Haemophilus influenzae NOT DETECTED NOT DETECTED Final   Neisseria meningitidis NOT DETECTED NOT DETECTED Final   Pseudomonas aeruginosa NOT DETECTED NOT DETECTED Final   Candida albicans NOT DETECTED NOT DETECTED Final   Candida glabrata NOT DETECTED NOT DETECTED Final   Candida krusei NOT DETECTED NOT DETECTED Final   Candida parapsilosis NOT DETECTED NOT DETECTED Final   Candida tropicalis NOT DETECTED NOT DETECTED Final    Comment: Performed at [facility], [address] [place], [place] [address]  Culture, blood (routine x 2)     Status: Abnormal   Collection Time:  10/27/19  9:23 PM   Specimen: BLOOD  Result Value Ref Range Status   Specimen Description   Final    BLOOD BLOOD LEFT HAND Performed at [facility], [address], [place], [place] [address]    Special Requests   Final    BOTTLES DRAWN AEROBIC AND ANAEROBIC Blood Culture adequate volume Performed at [facility], [address] [place], [place] [address]    Culture  Setup Time   Final    GRAM POSITIVE COCCI IN BOTH AEROBIC AND ANAEROBIC BOTTLES CRITICAL RESULT CALLED TO, READ BACK BY AND VERIFIED WITH: KRISTEN MERRILL AT 1005 10/28/19.PMF Performed at [facility], [place] [place], [place] [address]    Culture (A)  Final    STREPTOCOCCUS AGALACTIAE SUSCEPTIBILITIES PERFORMED ON PREVIOUS CULTURE WITHIN THE LAST 5 DAYS. Performed at [facility], [place] [address], [place], [place] [address]    Report Status 10/30/2019 FINAL  Final  SARS CORONAVIRUS 2 (TAT 6-24 HRS) Nasopharyngeal Nasopharyngeal Swab     Status: None   Collection Time: 10/27/19 11:07 PM   Specimen: Nasopharyngeal Swab  Result Value Ref Range  Status   SARS Coronavirus 2 NEGATIVE NEGATIVE Final    Comment: (NOTE) SARS-CoV-2 target nucleic acids are NOT DETECTED. The SARS-CoV-2 RNA is generally detectable in upper and lower respiratory specimens during the acute phase of infection. Negative results do not preclude SARS-CoV-2 infection, do not rule out co-infections with other pathogens, and should not be used as the sole basis for treatment or other patient management decisions. Negative results must be combined with clinical observations, patient history, and epidemiological information. The expected result is Negative. Fact Sheet for Patients: SugarRoll.be Fact Sheet for Healthcare Providers: https://www.woods-mathews.com/ This test is not yet approved or cleared by the [place] FDA and  has been authorized for detection  and/or diagnosis of SARS-CoV-2 by FDA under an Emergency Use Authorization (EUA). This EUA will remain  in effect (meaning this test can be used) for the duration of the COVID-19 declaration under Section 56 4(b)(1) of the Act, 21 U.S.C. section 360bbb-3(b)(1), unless the authorization is terminated or revoked sooner. Performed at [facility], [place] [address], [place], [place] [address]   Aerobic/Anaerobic Culture (surgical/deep wound)     Status: None   Collection Time: 10/28/19 12:30 PM   Specimen: Wound  Result Value Ref Range Status   Specimen Description   Final    WOUND LEG RIGHT Performed at [facility], [address], [place], [place] [address]    Special Requests   Final    Normal Performed at [facility], [place], [place] [address]    Gram Stain   Final    NO WBC SEEN MODERATE GRAM POSITIVE COCCI MODERATE GRAM POSITIVE RODS    Culture   Final    FEW STREPTOCOCCUS AGALACTIAE TESTING AGAINST S. AGALACTIAE NOT ROUTINELY PERFORMED DUE TO PREDICTABILITY OF AMP/PEN/VAN SUSCEPTIBILITY. RARE ACINETOBACTER CALCOACETICUS/BAUMANNII COMPLEX NO ANAEROBES ISOLATED Performed at [facility], [place] [address] [place], [place] [address]    Report Status 11/01/2019 FINAL  Final   Organism ID, Bacteria ACINETOBACTER CALCOACETICUS/BAUMANNII COMPLEX  Final      Susceptibility   Acinetobacter calcoaceticus/baumannii complex - MIC*    CEFTAZIDIME 4 SENSITIVE Sensitive     CIPROFLOXACIN >=4 RESISTANT Resistant     GENTAMICIN 2 SENSITIVE Sensitive     IMIPENEM <=0.25 SENSITIVE Sensitive     PIP/TAZO <=4 SENSITIVE Sensitive     TRIMETH/SULFA <=20 SENSITIVE Sensitive     AMPICILLIN/SULBACTAM <=2 SENSITIVE Sensitive     * RARE ACINETOBACTER CALCOACETICUS/BAUMANNII COMPLEX  CULTURE, BLOOD (ROUTINE X 2) w Reflex to ID Panel     Status: None   Collection Time: 10/29/19  1:44 PM   Specimen: BLOOD  Result Value Ref Range Status   Specimen  Description BLOOD [facility]  Final   Special Requests   Final    BOTTLES DRAWN AEROBIC AND ANAEROBIC Blood Culture adequate volume   Culture   Final    NO GROWTH 5 DAYS Performed at [facility], [address] [place], [place] [address]    Report Status 11/03/2019 FINAL  Final  CULTURE, BLOOD (ROUTINE X 2) w Reflex to ID Panel     Status: None   Collection Time: 10/29/19  1:46 PM   Specimen: BLOOD  Result Value Ref Range Status   Specimen Description BLOOD  LAC  Final   Special Requests   Final    BOTTLES DRAWN AEROBIC AND ANAEROBIC Blood Culture adequate volume   Culture   Final    NO GROWTH 5 DAYS Performed  at [facility], [address] [place], [place] [address]    Report Status 11/03/2019 FINAL  Final     Labs: Basic Metabolic Panel: Recent Labs  Lab 10/31/19 0526 11/01/19 0159 11/02/19 0652 11/03/19 0458 11/04/19 0409  NA 138 138 138 138 139  K 3.5 3.8 3.4* 3.4* 3.4*  CL 105 103 105 107 106  CO2 _0 GLUCOSE 90 107* 89 122* 97  BUN _1 CREATININE 0.93 0.89 0.94 0.76 0.76  CALCIUM 8.8* 9.0 8.7* 8.4* 8.4*  MG  --   --  2.2 2.3 2.2   CBC: Recent Labs  Lab 10/31/19 0526 11/01/19 0159 11/02/19 0652 11/03/19 0458 11/04/19 0409  WBC 6.9 9.9 10.6* 10.9* 9.4  HGB 11.2* 10.9* 10.2* 9.4* 9.6*  HCT 34.9* 34.3* 32.5* 30.0* 30.1*  MCV 90.6 92.0 92.6 92.3 90.7  PLT 146* 158 182 180 194   Cardiac Enzymes: Recent Labs  Lab 11/01/19 0159 11/01/19 0630  CKTOTAL 541* 654*    Recent Labs    04/29/19 1119  BNP 368.0*   CBG: Recent Labs  Lab 11/04/19 1630 11/04/19 1957 11/05/19 0028 11/05/19 0555 11/05/19 0727  GLUCAP 77 80 84 99 96    Principal Problem:   Bacteremia due to group B Streptococcus Active Problems:   Chronic kidney disease (CKD), stage III (moderate)   Type 2 diabetes mellitus with diabetic neuropathy, without long-term current use of insulin (HCC)   Cellulitis of right lower extremity   Acute deep  vein thrombosis (DVT) of right lower extremity (HCC)   Acute pain of left thigh   Time coordinating discharge: 35 minutes  Signed:  Murray Hodgkins, MD  [facility]  11/05/2019, 11:09 AM

## 2019-11-06 ENCOUNTER — Encounter: Payer: Self-pay | Admitting: Family Medicine

## 2019-11-06 LAB — GLUCOSE, CAPILLARY
Glucose-Capillary: 84 mg/dL (ref 70–99)
Glucose-Capillary: 86 mg/dL (ref 70–99)
Glucose-Capillary: 90 mg/dL (ref 70–99)
Glucose-Capillary: 97 mg/dL (ref 70–99)
Glucose-Capillary: 98 mg/dL (ref 70–99)

## 2019-11-06 NOTE — Progress Notes (Addendum)
PROGRESS NOTE  Becky Trevino [id number] DOB: [birth year] DOA: 10/27/2019 PCP: [name], MD  Brief History   74 year old woman PMH diabetes mellitus type 2, right ankle fracture in December post ORIF, presented with confusion and shortness of breath February 11.  Found to have right lower extremity DVT and associated cellulitis.  For details of hospitalization see discharge summary 2/20.  She is stable for discharge just awaiting insurance authorization.  A & P  Group B streptococcus bacteremia suspected secondary to right lower extremity cellulitis, nonhealing dehiscence prior ORIF site, ascending acinetobacter wound infection --Stable.  Continue IV antibiotics.  Right lower extremity DVT provoked by ankle surgery December 2020 as well as immobility. --Anticoagulation was stopped.  IVC filter was placed.  Seen by hematology.  Coagulopathy evaluation will be followed up in the outpatient setting by hematology. --No new rashes  Spontaneous iliopsoas hematoma secondary to therapeutic Lovenox.  Patient with history of left gluteal hematoma that had to be evacuated by orthopedics in the past while the patient was on Xarelto. --Patient was seen by orthopedics and vascular surgery.  Lovenox was stopped.  IVC filter was placed 2/17. --f/u with Dr. Tasia Catchings as an outpatient --Patient has significant weakness, pain from this hematoma and is not able to function as she was previously at home.  She is now agreeable to SNF. --No new issues  Right lower extremity wound status post right ankle ORIF December 2020 --Evaluated by surgery, did not appear infected.  Continues to be non weightbearing right lower extremity.  Peripheral vascular disease --Status post right lower extremity angiogram 2/17  Procedure(s) Performed: 1. Abdominal aortogram 2. Right lower extremity distal runoff third order catheter placement 3. 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 --Doxycycline continues for suppressive therapy and should be continued on discharge.  Diabetes mellitus type 2 with diabetic neuropathy, apparently diet controlled. --Stable.  No hypoglycemia in the last 24 hours.  Continue to monitor.  Disposition Plan: From home, anticipate transfer to SNF 2/22 if insurance authorization approved.  Need short-term rehab for debilitation following group B streptococcus bacteremia, right lower extremity DVT, spontaneous iliopsoas hematoma.  DVT prophylaxis: TED hose Code Status: Full Family Communication: None present    Murray Hodgkins, MD  [facility] Direct contact: see [email or web address] (further directions at bottom of note if needed) 7PM-7AM contact night coverage as at bottom of note 11/06/2019, 12:30 PM  LOS: 10 days    Interval History/Subjective  Continues to feel better.  Objective   Vitals:  Vitals:   11/05/19 2116 11/06/19 0357  BP: 125/63 118/70  Pulse: 84 77  Resp: 19 20  Temp: 98.5 F (36.9 C) 98.2 F (36.8 C)  SpO2: 94% 95%    Exam:  Constitutional.  Appears calm, comfortable. Respiratory.  Clear to auscultation bilaterally.  No wheezes, rales or rhonchi.  Normal respiratory effort. Cardiovascular.  Regular rate and rhythm.  No murmur, rub or gallop. Psychiatric.  Grossly normal mood and affect.  Speech fluent and appropriate. Musculoskeletal.  Moving left leg better today.  I have personally reviewed the following:   Today's Data  . CBG stable  Scheduled Meds: . brimonidine  1 drop Both Eyes TID   And  . brinzolamide  1 drop Both Eyes TID  . calcium-vitamin D  1 tablet Oral BID  . Chlorhexidine Gluconate Cloth  6 each Topical Daily  . doxycycline  100 mg Oral Q12H  .  fluticasone  2 spray Each Nare Daily  . imipramine  25 mg Oral QHS  . magnesium oxide  400 mg Oral Daily  . metoprolol  succinate  100 mg Oral QHS  . midodrine  5 mg Oral TID PC  . mirabegron ER  25 mg Oral Daily   Continuous Infusions: . ampicillin-sulbactam (UNASYN) IV 3 g (11/06/19 1220)    Principal Problem:   Bacteremia due to group B Streptococcus Active Problems:   Chronic kidney disease (CKD), stage III (moderate)   Type 2 diabetes mellitus with diabetic neuropathy, without long-term current use of insulin (HCC)   Cellulitis of right lower extremity   Acute deep vein thrombosis (DVT) of right lower extremity (HCC)   Acute pain of left thigh   LOS: 10 days   How to contact the [employer] Attending or Consulting provider 7A - 7P or covering provider during after hours [place], for this patient?  1. Check the care team in [facility] and look for a) attending/consulting TRH provider listed and b) the [facility] team listed 2. Log into [email or web address] and use [place]'s universal password to access. If you do not have the password, please contact the hospital operator. 3. Locate the [facility] provider you are looking for under [facility] and page to a number that you can be directly reached. 4. If you still have difficulty reaching the provider, please page the [facility] (Director on Call) for the Hospitalists listed on amion for assistance.

## 2019-11-06 NOTE — Progress Notes (Signed)
Physical Therapy Treatment Patient Details Name: SHULONDA DEMICHAEL MRN: [id number] DOB: [birth year] Today's Date: 11/06/2019    History of Present Illness Pt admitted with R LE cellulitis with complaints of confusion and dyspnea. HIstory includes R ankle fx s/p ORIF on 12.18.20, DM, and HTN. Pt with positive DVT, started on anticoag this date. Since admission, pt found to have L iliopsoas hematoma and believed to be responsible for new onset L hip pain and LLE weakness. Pt with IVC filter placement 11/02/19.     PT Comments    Pt is a pleasant 74 year old F who was admitted for above diagnoses. Pt performs bed mobility with min A requiring assistance to upright trunk with supine>sit and assistance with LLE with sit>supine. Pt instructed in supine therapeutic exercise this session for RLE strengthening, requiring cuing for correct performance with RLE.  With STS attempt with bed elevated and CAM boot donned, pt unable to complete successfully secondary to difficulty with forward momentum and liftoff while maintaining R nonweightbearing status. Pt instructed in seated there ex for RLE and LLE; pt unable to actively perform LAQ/knee extension this session with LLE; deferred further standing attempts and notified RN/MD of potential progressing weakness as precaution. (Per documentation, LLE weakness attributed to recent finding of L iliopsoas hematoma on 2/16; however pt was able to transfer with min A on eval on 2/12, max A with transfers on 2/19 and unable to complete successfully today. Recent documentation on 2/15 also mentioning pt reporting L thigh numbness.) Pt demonstrates deficits with standing balance, bilateral lower extremity weakness and decreased range of motion, impairing ability to perform functional mobility.   Follow Up Recommendations  SNF     Equipment Recommendations  None recommended by PT    Recommendations for Other Services       Precautions / Restrictions  Precautions Precautions: Fall Restrictions Weight Bearing Restrictions: Yes RLE Weight Bearing: Non weight bearing    Mobility  Bed Mobility Overal bed mobility: Needs Assistance Bed Mobility: Supine to Sit;Sit to Supine     Supine to sit: Min assist Sit to supine: Min assist   General bed mobility comments: min A for trunk support during supine to sit; mod A for assist with LLE.  Transfers                    Ambulation/Gait             General Gait Details: Deferred for safety.   Stairs             Wheelchair Mobility    Modified Rankin (Stroke Patients Only)       Balance Overall balance assessment: Needs assistance Sitting-balance support: No upper extremity supported;Feet unsupported Sitting balance-Leahy Scale: Good Sitting balance - Comments: Able to perform LE seated exercise with feet off ground and without UE support   Standing balance support: Bilateral upper extremity supported Standing balance-Leahy Scale: Poor Standing balance comment: Unable to successfully complete STS today while maintaining R non-weight bearing status                            Cognition Arousal/Alertness: Awake/alert Behavior During Therapy: WFL for tasks assessed/performed Overall Cognitive Status: Within Functional Limits for tasks assessed                                 General Comments: Pt is A  and O x 4      Exercises Total Joint Exercises Ankle Circles/Pumps: Strengthening;Both;20 reps Short Arc Quad: Strengthening;Right;10 reps(2x10) Heel Slides: Strengthening;Right;10 reps(2x10) Hip ABduction/ADduction: Strengthening;Right;10 reps(2x10) Long Arc Quad: PROM;Left;10 reps;Seated;Strengthening;Right    General Comments        Pertinent Vitals/Pain Pain Score: 0-No pain    Home Living                      Prior Function            PT Goals (current goals can now be found in the care plan section)  Acute Rehab PT Goals Patient Stated Goal: to get stronger PT Goal Formulation: With patient/family Time For Goal Achievement: 11/11/19 Potential to Achieve Goals: Good Progress towards PT goals: Progressing toward goals    Frequency    Min 2X/week      PT Plan Current plan remains appropriate    Co-evaluation              AM-PAC PT "6 Clicks" Mobility   Outcome Measure  Help needed turning from your back to your side while in a flat bed without using bedrails?: A Little Help needed moving from lying on your back to sitting on the side of a flat bed without using bedrails?: A Little Help needed moving to and from a bed to a chair (including a wheelchair)?: Total Help needed standing up from a chair using your arms (e.g., wheelchair or bedside chair)?: Total Help needed to walk in hospital room?: Total Help needed climbing 3-5 steps with a railing? : Total 6 Click Score: 10    End of Session Equipment Utilized During Treatment: Gait belt Activity Tolerance: Patient tolerated treatment well Patient left: in bed;with call bell/phone within reach;with bed alarm set Nurse Communication: Mobility status PT Visit Diagnosis: Muscle weakness (generalized) (M62.81);Difficulty in walking, not elsewhere classified (R26.2);Unsteadiness on feet (R26.81)     Time: 1550-1630 PT Time Calculation (min) (ACUTE ONLY): 40 min  Charges:  $Therapeutic Exercise: 23-37 mins $Therapeutic Activity: 8-22 mins                     Petra Kuba, PT, DPT 11/06/19, 5:57 PM

## 2019-11-06 NOTE — Plan of Care (Signed)
  Problem: Pain Managment: Goal: General experience of comfort will improve Outcome: Progressing   Problem: Education: Goal: Knowledge of General Education information will improve Description: Including pain rating scale, medication(s)/side effects and non-pharmacologic comfort measures Outcome: Progressing   Problem: Health Behavior/Discharge Planning: Goal: Ability to manage health-related needs will improve Outcome: Progressing   Problem: Clinical Measurements: Goal: Ability to maintain clinical measurements within normal limits will improve Outcome: Progressing Goal: Will remain free from infection Outcome: Progressing Goal: Diagnostic test results will improve Outcome: Progressing Goal: Respiratory complications will improve Outcome: Progressing Goal: Cardiovascular complication will be avoided Outcome: Progressing   Problem: Activity: Goal: Risk for activity intolerance will decrease Outcome: Progressing   Problem: Pain Managment: Goal: General experience of comfort will improve Outcome: Progressing   Problem: Safety: Goal: Ability to remain free from injury will improve Outcome: Progressing   Problem: Skin Integrity: Goal: Risk for impaired skin integrity will decrease Outcome: Progressing

## 2019-11-07 DIAGNOSIS — N183 Chronic kidney disease, stage 3 unspecified: Secondary | ICD-10-CM

## 2019-11-07 LAB — GLUCOSE, CAPILLARY
Glucose-Capillary: 75 mg/dL (ref 70–99)
Glucose-Capillary: 78 mg/dL (ref 70–99)
Glucose-Capillary: 71 mg/dL (ref 70–99)

## 2019-11-07 MED ORDER — SODIUM CHLORIDE 0.9 % IV SOLN
INTRAVENOUS | Status: DC | PRN
  Administered 2019-11-07: 250 mL via INTRAVENOUS

## 2019-11-07 NOTE — Discharge Summary (Signed)
Physician Discharge Summary  Becky Trevino WCB:[id number] DOB: 19-May-1946 DOA: 10/27/2019  PCP: [name], MD  Admit date: 10/27/2019 Discharge date: 11/07/2019  Recommendations for Outpatient Follow-up:   Group B streptococcus bacteremia suspected secondary to right lower extremity cellulitis, nonhealing dehiscence prior ORIF site, ascendingacinetobacter woundinfection --Remains stable and afebrile. Follow by infectious disease with recommendation for Unasyn weeks, end date 11/25/2019 --[name]:  Labs weeklyon Mondaywhile on IV antibiotics: _X_ CBC with differential __X_ CMP X ESR  _X_ Please pull PIC at completion of IV antibiotics _ Fax weekly labs toDr.[phone]  Clinic Follow Up Appt:in 3 weeks Call [phone] to make appt  Right lower extremity wound status post right ankle ORIF December 2020     Contact information for follow-up providers    [name], DPM. Schedule an appointment as soon as possible for a visit in 1 week(s).   Specialty: Podiatry Contact information: [place] [address] [phone]        [name], Dolores Lory, MD Follow up in 1 month(s).   Specialties: Vascular Surgery, Cardiology, Radiology, Vascular Surgery Why: s/p IVC filter insertion. No studies.  Contact information: [place] [address] [phone]        [name], MD. Schedule an appointment as soon as possible for a visit in 2 week(s).   Specialty: Infectious Diseases Contact information: [place] [address] [phone]        [name], MD. Schedule an appointment as soon as possible for a visit in 4 week(s).   Specialty: Oncology Contact information: [place] [address] [phone]            Contact information for after-discharge care    [name] .   Service:  Skilled Nursing Contact information: [place] [phone]                   Discharge Diagnoses: Principal diagnosis is #1 1. Group B streptococcus bacteremia suspected secondary to right lower extremity cellulitis, nonhealing dehiscence prior ORIF site, ascendingacinetobacter woundinfection 2. Right lower extremity DVT 3. Spontaneous iliopsoas hematomasecondary to therapeutic Lovenox.  4. Right lower extremity wound status post right ankle ORIF  5. Atherosclerotic occlusive disease bilateral lower extremities  6. 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 7. AKI superimposed on CKD stage IIIa 8. Diabetes mellitus type 2, apparently diet controlled.   Discharge Condition: improved Disposition: short-term SNF  Diet recommendation: heart healthy  Filed Weights   10/27/19 2108 10/28/19 0053  Weight: 114.8 kg 116.2 kg    History of present illness:  74 year old woman PMH diabetes mellitus type 2, right ankle fracture in December post ORIF, presented with confusion and shortness of breath February 11. Found to have right lower extremity DVT and associated cellulitis.    Hospital Course:  Patient was found to have group B streptococcus bacteremia secondary to right lower extremities myelitis and nonhealing dehiscence ORIF, concern for hardware infection.  Seen by infectious disease with recommendation for 4 weeks of IV antibiotics, details below.  Was treated with anticoagulation for right lower extremity DVT but developed spontaneous left iliopsoas hematoma with significant pain, therefore anticoagulation was stopped.  Given previous spontaneous hematoma last year, decision was made to place IVC filter.  She is evaluated by surgery and the wound did not appear to be grossly  infected and therefore conservative management was recommended.  She continues to  be nonweight bearing to the right lower extremity.  She was also seen by vascular surgery to address circulation right lower extremity and underwent procedure as listed below.  She was evaluated by physical therapy and recommended for SNF given nonweightbearing status of the right leg and new iliopsoas hematoma resulting in left lower extremity weakness and pain.   Group B streptococcus bacteremia suspected secondary to right lower extremity cellulitis, nonhealing dehiscence prior ORIF site, ascendingacinetobacter woundinfection --Remains stable and afebrile. Follow by infectious disease with recommendation for Unasyn weeks, end date 11/25/2019 --[facility] Care Per Protocol:  Labs weeklyon Mondaywhile on IV antibiotics: _X_ CBC with differential __X_ CMP X ESR  _X_ Please pull PIC at completion of IV antibiotics _ Fax weekly labs toDr.Ravishankar([phone]) [phone]  Clinic Follow Up Appt:in 3 weeks Call [phone] to make appt  Right lower extremity DVTprovoked by ankle surgery December 2020 as well as immobility. --Anticoagulation was stopped when patient developed spontaneous iliopsoas hematoma on the left. IVC filter was placed by vascular surgery. Seen by hematology Dr Tasia Catchings.Coagulopathy evaluation will be followed up in the outpatient setting by hematology.  Spontaneous iliopsoas hematomasecondary to therapeutic Lovenox. Patient with history of left gluteal hematoma on a prior hospitalization that had to be evacuated by orthopedics in the past while the patient was on Xarelto. --Patient was seen by orthopedics and vascular surgery. Lovenox was stopped. IVC filter was placed2/17.  Orthopedics Dr Rudene Christians  felt that her symptoms would improve with time. Pt has neurapraxia due to left iliopsoas hematoma --f/u with Dr. Tasia Catchings as an outpatient --Patient has significant weakness, pain from this hematoma and is not able to function as she was previously at home.  She is agreeable to  SNF.  Right lower extremity wound status post right ankle ORIF December 2020 --Evaluated by surgery, did not appear infected. Continues to be non weightbearing right lower extremity.  Peripheral vascular disease --Status post right lower extremity angiogram 2/17 Procedure(s) Performed: 1. Abdominal aortogram 2. Right lower extremity distal runoff third order catheter placement 3. 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 --Discussed with infectious disease.    Continue doxycycline.  AKI superimposed on CKD stage IIIathought secondary to dehydration --Resolved  Diabetes mellitus type 2, apparently diet controlled. --Stable.  Not on any medications.  Today's assessment: S: Feels well today, no complaints.  Left leg seems to be improving.  Some left thigh numbness.  Able to move leg. O: Vitals:  Vitals:   11/07/19 0841 11/07/19 1202  BP: 133/75 123/67  Pulse: 79 77  Resp: 16 18  Temp:  98.2 F (36.8 C)  SpO2: 100% 96%    Constitutional:  . Appears calm and comfortable.  Appears better today. Respiratory:  . CTA bilaterally, no w/r/r.  . Respiratory effort normal.  Cardiovascular:  . RRR, no m/r/g . No LE extremity edema   Musculoskeletal:  . RLE, LLE   . Able to move both lower extremities to command.  Sensation left extremity intact. Psychiatric:  . Mental status o Mood, affect appropriate  CBG stable  Discharge Instructions  Discharge Instructions    Home infusion instructions   Complete by: As directed    Instructions: Flushing of vascular access device: 0.9% NaCl pre/post medication administration and prn patency; Heparin 100 u/ml, 55m for implanted ports and Heparin 10u/ml, 52mfor all other central venous catheters.  Allergies as of 11/07/2019      Reactions   Lisinopril Swelling       Medication List    STOP taking these medications   celecoxib 200 MG capsule Commonly known as: CELEBREX   enoxaparin 40 MG/0.4ML injection Commonly known as: LOVENOX   gabapentin 300 MG capsule Commonly known as: NEURONTIN   oxycodone 5 MG capsule Commonly known as: OXY-IR   oxyCODONE-acetaminophen 5-325 MG tablet Commonly known as: PERCOCET/ROXICET   oxyCODONE-acetaminophen 7.5-325 MG tablet Commonly known as: PERCOCET   traZODone 50 MG tablet Commonly known as: DESYREL   Xarelto 20 MG Tabs tablet Generic drug: rivaroxaban     TAKE these medications   acetaminophen 650 MG CR tablet Commonly known as: TYLENOL Take 650-1,300 mg by mouth 2 (two) times daily as needed for pain.   ampicillin-sulbactam  IVPB Commonly known as: UNASYN Inject 3 g into the vein every 6 (six) hours for 23 days. Indication:  GBS Bacteremia and right leg hardware infection, Acinetobacter wound infection Last Day of Therapy:  11/25/2019 [facility] Care Per Protocol:  Labs weekly on Monday while on IV antibiotics: _X_ CBC with differential __X_ CMP X ESR  _X_ Please pull PIC at completion of IV antibiotics _ Fax weekly labs to [facility]  [phone]  Clinic Follow Up Appt:in 3 weeks  Call [phone] to make appt   atorvastatin 40 MG tablet Commonly known as: LIPITOR Take 1 tablet (40 mg total) by mouth daily at 6 PM. What changed: when to take this   Calcium 600-D 600-400 MG-UNIT tablet Generic drug: Calcium Carbonate-Vitamin D Take 1 tablet by mouth 2 (two) times daily.   doxycycline 100 MG capsule Commonly known as: VIBRAMYCIN Take 100 mg by mouth 2 (two) times daily.   fluticasone 50 MCG/ACT nasal spray Commonly known as: FLONASE Place 2 sprays into both nostrils daily.   furosemide 20 MG tablet Commonly known as: LASIX Take 20 mg by mouth every evening.   glucose blood test strip Commonly known as: ONE TOUCH ULTRA TEST Use as instructed    HYDROcodone-acetaminophen 5-325 MG tablet Commonly known as: NORCO/VICODIN Take 1 tablet by mouth every 6 (six) hours as needed for moderate pain or severe pain.   imipramine 25 MG tablet Commonly known as: TOFRANIL Take 1 tablet (25 mg total) by mouth at bedtime.   ketoconazole 2 % cream Commonly known as: NIZORAL Apply 1 application topically 2 (two) times daily as needed (rash).   Magnesium Oxide 500 MG Caps Take 500 mg by mouth daily.   metoprolol succinate 100 MG 24 hr tablet Commonly known as: TOPROL-XL Take 100 mg by mouth at bedtime.   midodrine 5 MG tablet Commonly known as: PROAMATINE Take 1 tablet (5 mg total) by mouth 3 (three) times daily with meals. What changed: when to take this   mirabegron ER 25 MG Tb24 tablet Commonly known as: Myrbetriq Take 1 tablet (25 mg total) by mouth daily.   potassium chloride 10 MEQ tablet Commonly known as: KLOR-CON Take 10 mEq by mouth daily.   Rocklatan 0.02-0.005 % Soln Generic drug: Netarsudil-Latanoprost Place 1 drop into both eyes at bedtime.   Simbrinza 1-0.2 % Susp Generic drug: Brinzolamide-Brimonidine Place 1 drop into both eyes 2 (two) times daily.            Home Infusion Instuctions  (From admission, onward)         Start     Ordered   11/05/19 0000  Home infusion instructions  Question:  Instructions  Answer:  Flushing of vascular access device: 0.9% NaCl pre/post medication administration and prn patency; Heparin 100 u/ml, 14m for implanted ports and Heparin 10u/ml, 550mfor all other central venous catheters.   11/05/19 1108         Allergies  Allergen Reactions  . Lisinopril Swelling    The results of significant diagnostics from this hospitalization (including imaging, microbiology, ancillary and laboratory) are listed below for reference.    Significant Diagnostic Studies: DG Lumbar Spine 2-3 Views  Result Date: 10/31/2019 CLINICAL DATA:  Left thigh pain EXAM: LUMBAR SPINE - 2-3  VIEW COMPARISON:  None. FINDINGS: Five lumbar type vertebral bodies are well visualized. Vertebral body height is well maintained. Degenerative anterolisthesis of L4 on L5 is noted. Facet hypertrophic changes are seen. Changes of prior left hip replacement are noted. Degenerative changes of the right hip are seen. Aortic calcifications are noted. IMPRESSION: Degenerative change with anterolisthesis of L4 on L5. Electronically Signed   By: MaInez Catalina.D.   On: 10/31/2019 16:41   DG Ankle 2 Views Right  Result Date: 10/28/2019 CLINICAL DATA:  The patient suffered a distal fibular fracture due to a fall in December, 2020. The patient underwent ORIF 09/02/2019 and now has developed swelling, warmth and tenderness about the surgical site. EXAM: RIGHT ANKLE - 2 VIEW COMPARISON:  Intraoperative imaging for fracture fixation 09/02/2019. FINDINGS: Plate and screw fixation of a distal fibular fracture is identified. Position and alignment are anatomic. Subtle lucency about fixation screws is most notable about the 3rd-6th screws from the top and worrisome for infection or loosening. Soft tissues about the ankle are swollen. No soft tissue gas is identified. Vac drain is in place. IMPRESSION: Status post fixation of a distal fibular fracture. There is subtle lucency about multiple fixation screws worrisome for loosening or infection. Soft tissue swelling about the ankle without soft tissue gas. Electronically Signed   By: ThInge Rise.D.   On: 10/28/2019 09:29   CT Head Wo Contrast  Result Date: 10/27/2019 CLINICAL DATA:  Encephalopathy EXAM: CT HEAD WITHOUT CONTRAST TECHNIQUE: Contiguous axial images were obtained from the base of the skull through the vertex without intravenous contrast. COMPARISON:  Head CT 08/07/2019 FINDINGS: Brain: There is no mass, hemorrhage or extra-axial collection. The size and configuration of the ventricles and extra-axial CSF spaces are normal. The brain parenchyma is normal,  without acute or chronic infarction. Vascular: No abnormal hyperdensity of the major intracranial arteries or dural venous sinuses. No intracranial atherosclerosis. Skull: Craniocervical fusion. Sinuses/Orbits: No fluid levels or advanced mucosal thickening of the visualized paranasal sinuses. No mastoid or middle ear effusion. The orbits are normal. IMPRESSION: Normal brain. Electronically Signed   By: KeUlyses Jarred.D.   On: 10/27/2019 22:18   CT ABDOMEN PELVIS W CONTRAST  Result Date: 11/01/2019 CLINICAL DATA:  Hip pain, abscess suspected EXAM: CT ABDOMEN AND PELVIS WITH CONTRAST TECHNIQUE: Multidetector CT imaging of the abdomen and pelvis was performed using the standard protocol following bolus administration of intravenous contrast. CONTRAST:  10013mMNIPAQUE IOHEXOL 300 MG/ML  SOLN COMPARISON:  Left hip, 1,021 abdomen pelvis, 08/07/2019 FINDINGS: Lower chest: No acute abnormality. Dependent bibasilar scarring and/or atelectasis. Left coronary artery calcifications. Hepatobiliary: No solid liver abnormality is seen. Gallstones and/or sludge in the gallbladder. No gallbladder wall thickening, or biliary dilatation. Pancreas: Unremarkable. No pancreatic ductal dilatation or surrounding inflammatory changes. Spleen: Normal in size without significant abnormality. Adrenals/Urinary Tract: Adrenal glands are unremarkable. Kidneys are normal,  without renal calculi, solid lesion, or hydronephrosis. Bladder is unremarkable. Stomach/Bowel: Stomach is within normal limits. Incidental duodenal diverticulum. Appendix appears normal. No evidence of bowel wall thickening, distention, or inflammatory changes. Vascular/Lymphatic: Aortic atherosclerosis. No enlarged abdominal or pelvic lymph nodes. Reproductive: No mass or other significant abnormality. Other: No abdominal wall hernia or abnormality. No abdominopelvic ascites. Musculoskeletal: Status post left hip total arthroplasty. Severe arthrosis and subchondral cyst  formation of the right hip. There is a large hematoma within the left iliacus muscle body and conjoint tendon of the iliopsoas, measuring at least 6.8 x 3.7 x 13.8 cm (series 3, image 64, series 6, image 119). Assessment of the inferior portion is difficult due to dense streak artifact from adjacent hip arthroplasty. There is a superficial fluid collection overlying the left gluteus musculature, which is substantially decreased in size compared to prior CT dated 08/07/2019, measuring 5.0 x 3.3 cm (series 2, image 5). IMPRESSION: 1. Large hematoma within the left iliacus muscle body and conjoint tendon of the iliopsoas, measuring at least 6.8 x 3.7 x 13.8 cm. Assessment of the inferior portion is difficult due to dense streak artifact from adjacent hip arthroplasty. There is no obvious tendon retraction or associated fracture. 2. Superficial fluid collection overlying the left gluteus musculature is substantially decreased in size compared to prior CT dated 08/07/2019, measuring 5.0 x 3.3 cm, consistent with residual hematoma or seroma. 3. Cholelithiasis without evidence of cholecystitis. 4. Coronary artery disease.  Aortic Atherosclerosis (ICD10-I70.0). Electronically Signed   By: Eddie Candle M.D.   On: 11/01/2019 17:13   CT ANKLE RIGHT W CONTRAST  Result Date: 10/31/2019 CLINICAL DATA:  Dehiscence of the surgical wound along the right lateral ankle. Prior plate and screw fixation of the lateral malleolus. EXAM: CT OF THE RIGHT ANKLE WITH CONTRAST TECHNIQUE: Multidetector CT imaging of the right ankle was performed following the standard protocol during bolus administration of intravenous contrast. CONTRAST:  157m OMNIPAQUE IOHEXOL 300 MG/ML  SOLN COMPARISON:  Radiographs from 10/28/2019 FINDINGS: Bones/Joint/Cartilage Lateral malleolar plate and screw fixator traversing the oblique fracture, with lack of bony bridging and early cortication along the fracture plane favoring early nonunion. Concern was raised  on radiography for lucency around the screws but currently no abnormal lucency is observed around the 5 screws proximal to the fracture or 2 screws distal to the fracture to favor loosening or infection. No bony destructive findings are identified. Small fragments along the anterior margin of the fracture plane indicate mild prior comminution There is periosteal reaction along the posterior tibial metaphysis along with a corticated ossicle along the posteroinferior tibial rim indicating a likely small posterior malleolar fracture component of the prior injury, favoring at least a stage III Weber B injury. There is a small ossicle below the medial malleolus but it appears well corticated and does not encompass the entire attachment of the deep tibiotalar portion of the deltoid ligament., which grossly appears intact, implying that this was probably not a stage IV Weber B injury. Mild irregular spurring with some underlying lucency in the medial talar dome posteriorly for example on images 80-85 of series 6, favoring degenerative spurring and potentially a small sagittally oriented non-fragmented chronic osteochondral injury. Degenerative arthropathy of the Lisfranc joint medially; only part of the Lisfranc joint is included on today's examination but there is clearly considerable spurring and loss of articular space between the medial cuneiform and base of the first metatarsal, and the middle cuneiform and the base of the second metatarsal. Please note  that the Lisfranc joint is not included in its entirety. Mild articular surface irregularity along the posterior tibial plafond for example on image 44/7. Plantar calcaneal spur noted. Ligaments Suboptimally assessed by CT. My sense on image 83/9 is that the deep tibiotalar portion of the deltoid ligament is probably intact, although there is some indistinctness and surrounding edema. The tibionavicular and tibiospring portions of deltoid ligament are obscured by  surrounding edema. Small bony fragments just above the expected vicinity of the anterior talofibular ligament. Muscles and Tendons No flexor tendon entrapment. Expanded contour of the tibialis posterior tendon compatible with tendinopathy. Soft tissues Subcutaneous edema surrounding the ankle and tracking in the dorsum of the foot and the heel. There is some confluent mild cutaneous irregularity overlying the lateral plate and screw fixator for example on image 51/8 at least partially from overlying bandaging, but reportedly this is the region of the cutaneous wound. I do not see any gas tracking in the soft tissues. I do not observe a drainable rim enhancing fluid collection to suggest abscess. IMPRESSION: 1. Lateral malleolar plate and screw fixator traversing the oblique fracture, with lack of bony bridging and early cortication along the fracture plane favoring early nonunion. There is also some periosteal reaction along the posterior tibial metaphysis compatible with a small posterior malleolar fracture component of the prior injury. Appearance compatible with previous Weber B stage III fracture. 2. No current lucency along the screws of the plate and screw fixator to suggest loosening or infection around the screws. 3. Small ossicle below the medial malleolus appears well corticated and does not encompass the entire attachment of the deep tibiotalar portion of the deltoid ligament. 4. Degenerative arthropathy of the Lisfranc joint medially, only part of the Lisfranc joint is included on today's examination. 5. Plantar calcaneal spur. 6. Expanded contour of the tibialis posterior tendon compatible with tendinopathy. Correlate clinically in assessing for tibialis posterior dysfunction. 7. Subcutaneous edema surrounding the ankle and tracking in the dorsum of the foot and heel. No drainable abscess is identified. No gas tracking in the soft tissues. 8. Mild articular surface irregularity along the posterior  tibial plafond, query a small sagittally oriented non-fragmented chronic osteochondral injury. Electronically Signed   By: Van Clines M.D.   On: 10/31/2019 08:23   CT FEMUR LEFT W CONTRAST  Result Date: 10/31/2019 CLINICAL DATA:  Acute onset of left thigh pain. Remote left hip arthroplasty. EXAM: CT OF THE LOWER RIGHT EXTREMITY WITH CONTRAST TECHNIQUE: Multidetector CT imaging of the lower right extremity was performed according to the standard protocol following intravenous contrast administration. COMPARISON:  Radiographs, same date. CONTRAST:  128m OMNIPAQUE IOHEXOL 300 MG/ML  SOLN FINDINGS: The left hip prosthesis is intact. No findings suspicious for periprosthetic fracture or loosening. No dislocation. The left femur is intact. A total left knee arthroplasty is noted. No obvious complicating features. Marked enlargement of the left iliopsoas muscle suggesting hematoma, possible spontaneous bleed or tear, if there was trauma. This is likely causing the patient's left hip pain. There is also some subcutaneous edema or hemorrhage in the lateral hip area. No significant intrapelvic abnormalities are identified. The pubic symphysis and pubic bones are intact. Advanced degenerative changes are noted involving the right hip. IMPRESSION: 1. Marked enlargement of the left iliopsoas muscle suggesting hematoma, possible spontaneous bleed or tear, if there was trauma. 2. Intact left hip prosthesis and no findings suspicious for periprosthetic fracture or loosening. 3. No significant intrapelvic abnormalities. 4. Advanced degenerative changes involving the right hip.  Electronically Signed   By: Marijo Sanes M.D.   On: 10/31/2019 16:46   PERIPHERAL VASCULAR CATHETERIZATION  Result Date: 11/02/2019 See op note  US Venous Img Lower Unilateral Right  Result Date: 10/27/2019 CLINICAL DATA:  Erythema and edema EXAM: Right LOWER EXTREMITY VENOUS DOPPLER ULTRASOUND TECHNIQUE: Gray-scale sonography with graded  compression, as well as color Doppler and duplex ultrasound were performed to evaluate the lower extremity deep venous systems from the level of the common femoral vein and including the common femoral, femoral, profunda femoral, popliteal and calf veins including the posterior tibial, peroneal and gastrocnemius veins when visible. The superficial great saphenous vein was also interrogated. Spectral Doppler was utilized to evaluate flow at rest and with distal augmentation maneuvers in the common femoral, femoral and popliteal veins. COMPARISON:  None FINDINGS: Contralateral Common Femoral Vein: Respiratory phasicity is normal and symmetric with the symptomatic side. No evidence of thrombus. Normal compressibility. Common Femoral Vein: No evidence of thrombus. Normal compressibility, respiratory phasicity and response to augmentation. Saphenofemoral Junction: There is occlusive thrombus at the junction of the saphenous vein with the common femoral vein. Profunda Femoral Vein: No evidence of thrombus. Normal compressibility and flow on color Doppler imaging. Femoral Vein: No evidence of thrombus. Normal compressibility, respiratory phasicity and response to augmentation. Popliteal Vein: There is occlusive thrombus within the right popliteal vein. Calf Veins: No evidence of thrombus. Normal compressibility and flow on color Doppler imaging. Superficial Great Saphenous Vein: No evidence of thrombus. Normal compressibility. Venous Reflux:  None. Other Findings:  None. IMPRESSION: 1. Deep venous thrombus within the right popliteal vein, with superficial thrombus seen at the saphenofemoral junction. These results will be called to the ordering clinician or representative by the Radiologist Assistant, and communication documented in the PACS or zVision Dashboard. Electronically Signed   By: Randa Ngo M.D.   On: 10/27/2019 23:04   DG Chest Port 1 View  Result Date: 10/27/2019 CLINICAL DATA:  Altered level of  consciousness, sepsis EXAM: PORTABLE CHEST 1 VIEW COMPARISON:  08/07/2019 FINDINGS: Single frontal view of the chest was performed. Lung volumes are diminished, with crowding of the pulmonary vasculature. No definite airspace disease, effusion, or pneumothorax. The cardiac silhouette is stable. IMPRESSION: 1. Low lung volumes with crowding of the pulmonary vasculature. 2. No acute airspace disease. Electronically Signed   By: Randa Ngo M.D.   On: 10/27/2019 22:40   DG HIP UNILAT WITH PELVIS 2-3 VIEWS LEFT  Result Date: 10/31/2019 CLINICAL DATA:  Left thigh pain EXAM: DG HIP (WITH OR WITHOUT PELVIS) 2-3V LEFT COMPARISON:  08/07/2019 FINDINGS: Pelvic ring is intact. Changes of prior left hip replacement are noted. Significant degenerative changes in the right hip are seen. No soft tissue abnormality is seen. IMPRESSION: Chronic changes without acute abnormality. Electronically Signed   By: Inez Catalina M.D.   On: 10/31/2019 16:42    Microbiology: Recent Results (from the past 240 hour(s))  CULTURE, BLOOD (ROUTINE X 2) w Reflex to ID Panel     Status: None   Collection Time: 10/29/19  1:44 PM   Specimen: BLOOD  Result Value Ref Range Status   Specimen Description BLOOD [facility]  Final   Special Requests   Final    BOTTLES DRAWN AEROBIC AND ANAEROBIC Blood Culture adequate volume   Culture   Final    NO GROWTH 5 DAYS Performed at [facility], [address], [place], [place] [address]    Report Status 11/03/2019 FINAL  Final  CULTURE, BLOOD (ROUTINE  X 2) w Reflex to ID Panel     Status: None   Collection Time: 10/29/19  1:46 PM   Specimen: BLOOD  Result Value Ref Range Status   Specimen Description BLOOD  LAC  Final   Special Requests   Final    BOTTLES DRAWN AEROBIC AND ANAEROBIC Blood Culture adequate volume   Culture   Final    NO GROWTH 5 DAYS Performed at [facility], [address] [place], [place] [address]    Report Status 11/03/2019 FINAL  Final   Respiratory Panel by RT PCR (Flu A&B, Covid) - Nasopharyngeal Swab     Status: None   Collection Time: 11/05/19 11:07 AM   Specimen: Nasopharyngeal Swab  Result Value Ref Range Status   SARS Coronavirus 2 by RT PCR NEGATIVE NEGATIVE Final    Comment: (NOTE) SARS-CoV-2 target nucleic acids are NOT DETECTED. The SARS-CoV-2 RNA is generally detectable in upper respiratoy specimens during the acute phase of infection. The lowest concentration of SARS-CoV-2 viral copies this assay can detect is 131 copies/mL. A negative result does not preclude SARS-Cov-2 infection and should not be used as the sole basis for treatment or other patient management decisions. A negative result may occur with  improper specimen collection/handling, submission of specimen other than nasopharyngeal swab, presence of viral mutation(s) within the areas targeted by this assay, and inadequate number of viral copies (<131 copies/mL). A negative result must be combined with clinical observations, patient history, and epidemiological information. The expected result is Negative. Fact Sheet for Patients:  PinkCheek.be Fact Sheet for Healthcare Providers:  GravelBags.it This test is not yet ap proved or cleared by the [place] FDA and  has been authorized for detection and/or diagnosis of SARS-CoV-2 by FDA under an Emergency Use Authorization (EUA). This EUA will remain  in effect (meaning this test can be used) for the duration of the COVID-19 declaration under Section 564(b)(1) of the Act, 21 U.S.C. section 360bbb-3(b)(1), unless the authorization is terminated or revoked sooner.    Influenza A by PCR NEGATIVE NEGATIVE Final   Influenza B by PCR NEGATIVE NEGATIVE Final    Comment: (NOTE) The Xpert Xpress SARS-CoV-2/FLU/RSV assay is intended as an aid in  the diagnosis of influenza from Nasopharyngeal swab specimens and  should not be used as a sole  basis for treatment. Nasal washings and  aspirates are unacceptable for Xpert Xpress SARS-CoV-2/FLU/RSV  testing. Fact Sheet for Patients: PinkCheek.be Fact Sheet for Healthcare Providers: GravelBags.it This test is not yet approved or cleared by the [place] FDA and  has been authorized for detection and/or diagnosis of SARS-CoV-2 by  FDA under an Emergency Use Authorization (EUA). This EUA will remain  in effect (meaning this test can be used) for the duration of the  Covid-19 declaration under Section 564(b)(1) of the Act, 21  U.S.C. section 360bbb-3(b)(1), unless the authorization is  terminated or revoked. Performed at [facility], [address] [place], [place] [address]      Labs: Basic Metabolic Panel: Recent Labs  Lab 11/01/19 0159 11/02/19 0652 11/03/19 0458 11/04/19 0409  NA 138 138 138 139  K 3.8 3.4* 3.4* 3.4*  CL 103 105 107 106  CO2 27 24 25 26   GLUCOSE 107* 89 122* 97  BUN 9 10 8 9   CREATININE 0.89 0.94 0.76 0.76  CALCIUM 9.0 8.7* 8.4* 8.4*  MG  --  2.2 2.3 2.2   CBC: Recent Labs  Lab 11/01/19 0159 11/02/19 0981  11/03/19 0458 11/04/19 0409  WBC 9.9 10.6* 10.9* 9.4  HGB 10.9* 10.2* 9.4* 9.6*  HCT 34.3* 32.5* 30.0* 30.1*  MCV 92.0 92.6 92.3 90.7  PLT 158 182 180 194   Cardiac Enzymes: Recent Labs  Lab 11/01/19 0159 11/01/19 0630  CKTOTAL 541* 654*    Recent Labs    04/29/19 1119  BNP 368.0*   CBG: Recent Labs  Lab 11/06/19 0749 11/06/19 1212 11/06/19 1625 11/07/19 0639 11/07/19 1145  GLUCAP 97 98 90 75 78      Time coordinating discharge: 35 minutes  Signed:  Fritzi Mandes MD  [facility]  11/07/2019, 3:18 PM

## 2019-11-07 NOTE — TOC Progression Note (Signed)
Transition of Care [facility]) - Progression Note    Patient Details  Name: Becky Trevino MRN: [id number] Date of Birth: 08-13-1946  Transition [facility]) CM/SW Contact  Beverly Sessions, RN Phone Number: 11/07/2019, 1:34 PM  Clinical Narrative:    Insurance auth still pending for SNF placement at [facility] [facility]   Expected Discharge Plan: [place] Barriers to Discharge: Insurance Authorization  Expected Discharge Plan and Services Expected Discharge Plan: [place] arrangements for the past 2 months: Single Family Home Expected Discharge Date: 11/05/19                                     Social Determinants of Health (SDOH) Interventions    Readmission Risk Interventions No flowsheet data found.

## 2019-11-07 NOTE — TOC Transition Note (Signed)
Transition of Care [facility]) - CM/SW Discharge Note   Patient Details  Name: Becky Trevino MRN: [id number] Date of Birth: Apr 12, 1946  Transition of Care [facility]) CM/SW Contact:  Beverly Sessions, RN Phone Number: 11/07/2019, 3:31 PM   Clinical Narrative:    Notified by Claiborne Billings at [facility] that insurance Josem Kaufmann has been obtained   Patient to be discharged today  RNCM provided bedside RN with EMS packet  EMS called  Patient updated and in agreement to plan   Final next level of care: Skilled Nursing Facility Barriers to Discharge: No Barriers Identified   Patient Goals and CMS Choice        Discharge Placement              Patient chooses bed at: [facility] Patient to be transferred to facility by: EMS      Discharge Plan and Services                                     Social Determinants of Health (SDOH) Interventions     Readmission Risk Interventions No flowsheet data found.

## 2019-11-07 NOTE — Progress Notes (Signed)
Becky Trevino to be D/C'd Rehab per MD order.  Discussed prescriptions and follow up appointments with the patient. Prescriptions given to patient, medication list explained in detail. Pt verbalized understanding.  Allergies as of 11/07/2019      Reactions   Lisinopril Swelling      Medication List    STOP taking these medications   celecoxib 200 MG capsule Commonly known as: CELEBREX   enoxaparin 40 MG/0.4ML injection Commonly known as: LOVENOX   gabapentin 300 MG capsule Commonly known as: NEURONTIN   oxycodone 5 MG capsule Commonly known as: OXY-IR   oxyCODONE-acetaminophen 5-325 MG tablet Commonly known as: PERCOCET/ROXICET   oxyCODONE-acetaminophen 7.5-325 MG tablet Commonly known as: PERCOCET   traZODone 50 MG tablet Commonly known as: DESYREL   Xarelto 20 MG Tabs tablet Generic drug: rivaroxaban     TAKE these medications   acetaminophen 650 MG CR tablet Commonly known as: TYLENOL Take 650-1,300 mg by mouth 2 (two) times daily as needed for pain.   ampicillin-sulbactam  IVPB Commonly known as: UNASYN Inject 3 g into the vein every 6 (six) hours for 23 days. Indication:  GBS Bacteremia and right leg hardware infection, Acinetobacter wound infection Last Day of Therapy:  11/25/2019 [facility] Care Per Protocol:  Labs weekly on Monday while on IV antibiotics: _X_ CBC with differential __X_ CMP X ESR  _X_ Please pull PIC at completion of IV antibiotics _ Fax weekly labs to [place]  [phone]  Clinic Follow Up Appt:in 3 weeks  Call [phone] to make appt   atorvastatin 40 MG tablet Commonly known as: LIPITOR Take 1 tablet (40 mg total) by mouth daily at 6 PM. What changed: when to take this   Calcium 600-D 600-400 MG-UNIT tablet Generic drug: Calcium Carbonate-Vitamin D Take 1 tablet by mouth 2 (two) times daily.   doxycycline 100 MG capsule Commonly known as: VIBRAMYCIN Take 100 mg by mouth 2 (two) times daily.   fluticasone 50  MCG/ACT nasal spray Commonly known as: FLONASE Place 2 sprays into both nostrils daily.   furosemide 20 MG tablet Commonly known as: LASIX Take 20 mg by mouth every evening.   glucose blood test strip Commonly known as: ONE TOUCH ULTRA TEST Use as instructed   HYDROcodone-acetaminophen 5-325 MG tablet Commonly known as: NORCO/VICODIN Take 1 tablet by mouth every 6 (six) hours as needed for moderate pain or severe pain.   imipramine 25 MG tablet Commonly known as: TOFRANIL Take 1 tablet (25 mg total) by mouth at bedtime.   ketoconazole 2 % cream Commonly known as: NIZORAL Apply 1 application topically 2 (two) times daily as needed (rash).   Magnesium Oxide 500 MG Caps Take 500 mg by mouth daily.   metoprolol succinate 100 MG 24 hr tablet Commonly known as: TOPROL-XL Take 100 mg by mouth at bedtime.   midodrine 5 MG tablet Commonly known as: PROAMATINE Take 1 tablet (5 mg total) by mouth 3 (three) times daily with meals. What changed: when to take this   mirabegron ER 25 MG Tb24 tablet Commonly known as: Myrbetriq Take 1 tablet (25 mg total) by mouth daily.   potassium chloride 10 MEQ tablet Commonly known as: KLOR-CON Take 10 mEq by mouth daily.   Rocklatan 0.02-0.005 % Soln Generic drug: Netarsudil-Latanoprost Place 1 drop into both eyes at bedtime.   Simbrinza 1-0.2 % Susp Generic drug: Brinzolamide-Brimonidine Place 1 drop into both eyes 2 (two) times daily.  Home Infusion Instuctions  (From admission, onward)         Start     Ordered   11/05/19 0000  Home infusion instructions    Question:  Instructions  Answer:  Flushing of vascular access device: 0.9% NaCl pre/post medication administration and prn patency; Heparin 100 u/ml, 64m for implanted ports and Heparin 10u/ml, 554mfor all other central venous catheters.   11/05/19 1108          Vitals:   11/07/19 1202 11/07/19 1729  BP: 123/67 (!) 128/55  Pulse: 77 82  Resp: 18 20   Temp: 98.2 F (36.8 C) 98 F (36.7 C)  SpO2: 96% 100%    An After Visit Summary was printed and given to EMS [facility]/22/2021 6:15 PM

## 2019-11-07 NOTE — Progress Notes (Signed)
PT Screening Note  Patient Details Name: Becky Trevino MRN: [id number] DOB: 09-02-1946   Author called to screen patient during PT treatment session by Physical Therapy Assistant. PTA concerned for change in LLE function since beginning with our services. Pt reports these acute changes began prior to vascular procedure. Examination reveals normal motor function about the LLE except for knee extension, no palpable activation of the quadriceps muscle is detected. Pt also has focal loss of sensation on the LLE from mid anterior thigh to mid anteromedial foreleg. Discussed these findings with attending and ortho consult, making concerns known regarding potential for acute peripheral neurological changes. Dr. Rudene Christians reports findings congruent with femoral nerve compression in the setting of iliopsoas hematoma, he suspects will resolve with resolution of hematoma. Will continue to follow.     12:44 PM, 11/07/19 Etta Grandchild, PT, DPT Physical Therapist - [facility]  [phone] ([place])    [place] 11/07/2019, 12:36 PM

## 2019-11-07 NOTE — Progress Notes (Signed)
Physical Therapy Treatment Patient Details Name: Becky Trevino MRN: [id number] DOB: 1946/03/22 Today's Date: 11/07/2019    History of Present Illness Pt admitted with R LE cellulitis with complaints of confusion and dyspnea. HIstory includes R ankle fx s/p ORIF on 12.18.20, DM, and HTN. Pt with positive DVT, started on anticoag this date.    PT Comments    Pt ready for session.  Stated she is having increasing trouble transferring to commode and at times +3 assist is needed.  She reports numbness around knee and midway down leg which is not her baseline.  Ex's in supine with attention to strength in LLE.  Little to no quad activation noted.  BLE for ankle pumps, quad sets, SAQ SLR, ab/add and heel slides in supine, LAQ in sitting.  2 x 10 She is unable to SLR, LAQ or SAQ without assist.  She is able to hip/knee flex in supine about 10 degrees before it is too difficult for her.  Sitting EOB with min guard to sit and mod a x 1 to return to supine as she is unable to lift LLE back onto bed on her own.  Called PT on site into check as she is having significant decline in quad strength and ability to transfer over the past week.  See PT note for further details and follow up with MD communication.     Follow Up Recommendations  SNF     Equipment Recommendations       Recommendations for Other Services       Precautions / Restrictions Precautions Precautions: Fall Restrictions Weight Bearing Restrictions: Yes RLE Weight Bearing: Non weight bearing LLE Weight Bearing: Weight bearing as tolerated    Mobility  Bed Mobility Overal bed mobility: Needs Assistance Bed Mobility: Supine to Sit;Sit to Supine     Supine to sit: Min assist Sit to supine: Mod assist   General bed mobility comments: assist to get LLE back into bed - unable to raise it  Transfers                 General transfer comment: deferred due to LLE quad weakness and NWB RLE  Ambulation/Gait              General Gait Details: Deferred for safety.   Stairs             Wheelchair Mobility    Modified Rankin (Stroke Patients Only)       Balance Overall balance assessment: Needs assistance Sitting-balance support: No upper extremity supported;Feet unsupported Sitting balance-Leahy Scale: Good         Standing balance comment: deferred standing.                            Cognition Arousal/Alertness: Awake/alert Behavior During Therapy: WFL for tasks assessed/performed Overall Cognitive Status: Within Functional Limits for tasks assessed                                        Exercises      General Comments        Pertinent Vitals/Pain Pain Assessment: No/denies pain    Home Living                      Prior Function            PT Goals (  current goals can now be found in the care plan section) Progress towards PT goals: Not progressing toward goals - comment    Frequency    Min 2X/week      PT Plan Current plan remains appropriate    Co-evaluation              AM-PAC PT "6 Clicks" Mobility   Outcome Measure  Help needed turning from your back to your side while in a flat bed without using bedrails?: A Little Help needed moving from lying on your back to sitting on the side of a flat bed without using bedrails?: A Little Help needed moving to and from a bed to a chair (including a wheelchair)?: Total Help needed standing up from a chair using your arms (e.g., wheelchair or bedside chair)?: Total Help needed to walk in hospital room?: Total Help needed climbing 3-5 steps with a railing? : Total 6 Click Score: 10    End of Session   Activity Tolerance: Patient tolerated treatment well   Nurse Communication: Mobility status       Time: [id number] PT Time Calculation (min) (ACUTE ONLY): 38 min  Charges:  $Therapeutic Exercise: 23-37 mins $Therapeutic Activity: 8-22 mins                     Chesley Noon, PTA 11/07/19, 1:55 PM

## 2019-11-08 ENCOUNTER — Telehealth: Payer: Self-pay | Admitting: Infectious Diseases

## 2019-11-08 DIAGNOSIS — I152 Hypertension secondary to endocrine disorders: Secondary | ICD-10-CM | POA: Diagnosis not present

## 2019-11-08 DIAGNOSIS — E1159 Type 2 diabetes mellitus with other circulatory complications: Secondary | ICD-10-CM | POA: Diagnosis not present

## 2019-11-08 DIAGNOSIS — E1140 Type 2 diabetes mellitus with diabetic neuropathy, unspecified: Secondary | ICD-10-CM | POA: Diagnosis not present

## 2019-11-08 NOTE — Telephone Encounter (Signed)
Becky Trevino called regarding 2 week follow up  Around 11/25/19  Dr Delaine Lame will be out of town.

## 2019-11-12 ENCOUNTER — Other Ambulatory Visit: Payer: Self-pay | Admitting: Family Medicine

## 2019-11-12 DIAGNOSIS — I10 Essential (primary) hypertension: Secondary | ICD-10-CM

## 2019-11-12 NOTE — Telephone Encounter (Signed)
Requested medication (s) are due for refill today: yes  Requested medication (s) are on the active medication list: no historical provider  Last refill:  08/29/19  Future visit scheduled: no  Notes to clinic:  Hx medication and provider   Requested Prescriptions  Pending Prescriptions Disp Refills   metoprolol succinate (TOPROL-XL) 100 MG 24 hr tablet [Pharmacy Med Name: METOPROLOL SUCC ER 100 MG TAB] 90 tablet 1    Sig: TAKE 1 TABLET (100 MG TOTAL) BY MOUTH EVERY EVENING. TAKE WITH OR IMMEDIATELY FOLLOWING A MEAL.      Cardiovascular:  Beta Blockers Passed - 11/12/2019  9:01 AM      Passed - Last BP in normal range    BP Readings from Last 1 Encounters:  11/07/19 (!) 128/55          Passed - Last Heart Rate in normal range    Pulse Readings from Last 1 Encounters:  11/07/19 82          Passed - Valid encounter within last 6 months    Recent Outpatient Visits           1 month ago Chronic infection of right knee [facility])   [facility] [name], MD   2 months ago Hospital discharge follow-up   [facility] [name], MD   6 months ago Type 2 diabetes mellitus with diabetic neuropathy, without long-term current use of insulin [facility])   [facility] [place], Drue Stager, MD   11 months ago Type 2 diabetes mellitus with diabetic neuropathy, without long-term current use of insulin [facility])   [facility] [place], Drue Stager, MD   1 year ago Type 2 diabetes mellitus with diabetic neuropathy, without long-term current use of insulin [facility])   [facility] [name], MD       Future Appointments             In 6 months [name], Gordan Payment [facility]

## 2019-11-14 DIAGNOSIS — M2550 Pain in unspecified joint: Secondary | ICD-10-CM | POA: Diagnosis not present

## 2019-11-14 DIAGNOSIS — B951 Streptococcus, group B, as the cause of diseases classified elsewhere: Secondary | ICD-10-CM | POA: Diagnosis not present

## 2019-11-14 DIAGNOSIS — D649 Anemia, unspecified: Secondary | ICD-10-CM | POA: Diagnosis not present

## 2019-11-15 DIAGNOSIS — M6281 Muscle weakness (generalized): Secondary | ICD-10-CM | POA: Diagnosis not present

## 2019-11-15 DIAGNOSIS — R262 Difficulty in walking, not elsewhere classified: Secondary | ICD-10-CM | POA: Diagnosis not present

## 2019-11-15 DIAGNOSIS — D6869 Other thrombophilia: Secondary | ICD-10-CM | POA: Diagnosis not present

## 2019-11-15 DIAGNOSIS — L03115 Cellulitis of right lower limb: Secondary | ICD-10-CM | POA: Diagnosis not present

## 2019-11-16 DIAGNOSIS — T8131XD Disruption of external operation (surgical) wound, not elsewhere classified, subsequent encounter: Secondary | ICD-10-CM | POA: Diagnosis not present

## 2019-11-16 DIAGNOSIS — L851 Acquired keratosis [keratoderma] palmaris et plantaris: Secondary | ICD-10-CM | POA: Diagnosis not present

## 2019-11-16 DIAGNOSIS — S82891G Other fracture of right lower leg, subsequent encounter for closed fracture with delayed healing: Secondary | ICD-10-CM | POA: Diagnosis not present

## 2019-11-18 DIAGNOSIS — S82891G Other fracture of right lower leg, subsequent encounter for closed fracture with delayed healing: Secondary | ICD-10-CM | POA: Diagnosis not present

## 2019-11-18 DIAGNOSIS — L089 Local infection of the skin and subcutaneous tissue, unspecified: Secondary | ICD-10-CM | POA: Diagnosis not present

## 2019-11-18 DIAGNOSIS — T8450XD Infection and inflammatory reaction due to unspecified internal joint prosthesis, subsequent encounter: Secondary | ICD-10-CM | POA: Diagnosis not present

## 2019-11-18 DIAGNOSIS — B951 Streptococcus, group B, as the cause of diseases classified elsewhere: Secondary | ICD-10-CM | POA: Diagnosis not present

## 2019-11-18 DIAGNOSIS — R7881 Bacteremia: Secondary | ICD-10-CM | POA: Diagnosis not present

## 2019-11-21 DIAGNOSIS — D649 Anemia, unspecified: Secondary | ICD-10-CM | POA: Diagnosis not present

## 2019-11-21 DIAGNOSIS — M2550 Pain in unspecified joint: Secondary | ICD-10-CM | POA: Diagnosis not present

## 2019-11-21 DIAGNOSIS — I10 Essential (primary) hypertension: Secondary | ICD-10-CM | POA: Diagnosis not present

## 2019-11-23 ENCOUNTER — Ambulatory Visit: Payer: BC Managed Care – PPO

## 2019-11-25 ENCOUNTER — Other Ambulatory Visit: Payer: Self-pay | Admitting: Family Medicine

## 2019-11-25 DIAGNOSIS — E785 Hyperlipidemia, unspecified: Secondary | ICD-10-CM

## 2019-11-30 DIAGNOSIS — R262 Difficulty in walking, not elsewhere classified: Secondary | ICD-10-CM | POA: Diagnosis not present

## 2019-11-30 DIAGNOSIS — E1142 Type 2 diabetes mellitus with diabetic polyneuropathy: Secondary | ICD-10-CM | POA: Diagnosis not present

## 2019-11-30 DIAGNOSIS — M7989 Other specified soft tissue disorders: Secondary | ICD-10-CM | POA: Diagnosis not present

## 2019-11-30 DIAGNOSIS — S82891G Other fracture of right lower leg, subsequent encounter for closed fracture with delayed healing: Secondary | ICD-10-CM | POA: Diagnosis not present

## 2019-12-02 DIAGNOSIS — L97511 Non-pressure chronic ulcer of other part of right foot limited to breakdown of skin: Secondary | ICD-10-CM | POA: Diagnosis not present

## 2019-12-02 DIAGNOSIS — M25562 Pain in left knee: Secondary | ICD-10-CM | POA: Diagnosis not present

## 2019-12-02 DIAGNOSIS — M25561 Pain in right knee: Secondary | ICD-10-CM | POA: Diagnosis not present

## 2019-12-02 DIAGNOSIS — M25461 Effusion, right knee: Secondary | ICD-10-CM | POA: Diagnosis not present

## 2019-12-02 DIAGNOSIS — G5722 Lesion of femoral nerve, left lower limb: Secondary | ICD-10-CM | POA: Diagnosis not present

## 2019-12-05 ENCOUNTER — Ambulatory Visit (INDEPENDENT_AMBULATORY_CARE_PROVIDER_SITE_OTHER): Payer: BC Managed Care – PPO | Admitting: Vascular Surgery

## 2019-12-05 ENCOUNTER — Encounter (INDEPENDENT_AMBULATORY_CARE_PROVIDER_SITE_OTHER): Payer: Self-pay | Admitting: Vascular Surgery

## 2019-12-05 ENCOUNTER — Other Ambulatory Visit: Payer: Self-pay

## 2019-12-05 VITALS — BP 111/72 | HR 106 | Resp 16

## 2019-12-05 DIAGNOSIS — E1140 Type 2 diabetes mellitus with diabetic neuropathy, unspecified: Secondary | ICD-10-CM

## 2019-12-05 DIAGNOSIS — E785 Hyperlipidemia, unspecified: Secondary | ICD-10-CM

## 2019-12-05 DIAGNOSIS — I82431 Acute embolism and thrombosis of right popliteal vein: Secondary | ICD-10-CM | POA: Diagnosis not present

## 2019-12-05 DIAGNOSIS — I872 Venous insufficiency (chronic) (peripheral): Secondary | ICD-10-CM | POA: Diagnosis not present

## 2019-12-05 NOTE — Progress Notes (Signed)
MRN : [id number]  Becky Trevino is a 74 y.o. (August 06, 1946) female who presents with chief complaint of No chief complaint on file. Marland Kitchen  History of Present Illness:   The patient presents to the office for evaluation of DVT.  DVT was identified at [facility] by Duplex ultrasound in the right popliteal.  The initial symptoms were pain and swelling in the lower extremity. This occurred perioperatively, she is  S/p right ankle fracture ORIF.  Her situation was also complicated by nonhealing of one of the incisions.  Today she reports that the incision is completely closed.  Her primary complaint today is severe bilateral swelling of the lower extremities.  Apparently when she was recently seen by Dr. Luana Shu the swelling was so significant he placed the right foot in an [employer].  The patient notes the leg continues to be painful with dependency and swells quite a bite.  Symptoms are much better with elevation.  The patient notes minimal edema in the Trevino which steadily worsens throughout the day.    The patient has not been using compression therapy at this point.  No SOB or pleuritic chest pains.  No cough or hemoptysis.  No blood per rectum or blood in any sputum.  No excessive bruising per the patient.   No outpatient medications have been marked as taking for the 12/05/19 encounter (Appointment) with [name], Dolores Lory, MD.    Past Medical History:  Diagnosis Date  . Cervical spondylosis   . Chronic kidney disease    Stage 3  . DDD (degenerative disc disease), cervical    [facility]  . Diabetes mellitus without complication ([place])   . Diverticulosis   . Hyperlipidemia   . Hypertension   . Intertrigo   . Leg cramps   . Leg varices   . Obesity   . OSA (obstructive sleep apnea)   . Symptomatic menopausal or female climacteric states   . Syncope     Past Surgical History:  Procedure Laterality Date  . ABDOMINAL HYSTERECTOMY  1993   Total  . BUNIONECTOMY Bilateral 1993   . I & D KNEE WITH POLY EXCHANGE Right 11/19/2017   Procedure: RIGHT KNEE POLY EXCHANGE WITH IRRIGATION AND DEBRIDEMENT;  Surgeon: Hessie Knows, MD;  Location: [facility];  Service: Orthopedics;  Laterality: Right;  . JOINT REPLACEMENT     bilateral knee  . LAMINECTOMY  11/14/2013    Cervical Fusion , [facility], Dr. Delilah Shan  . LASER ABLATION Bilateral 07/29/2012   Dr. Lucky Cowboy  . LOWER EXTREMITY ANGIOGRAPHY Right 11/02/2019   Procedure: Lower Extremity Angiography;  Surgeon: Katha Cabal, MD;  Location: [facility];  Service: Cardiovascular;  Laterality: Right;  . ORIF ANKLE FRACTURE Right 09/02/2019   Procedure: OPEN REDUCTION INTERNAL FIXATION (ORIF) ANKLE FRACTURE BIMALLEOLAR;  Surgeon: [name], DPM;  Location: [facility];  Service: Podiatry;  Laterality: Right;  . REPLACEMENT TOTAL KNEE Left 03/2010   Dr. Rudene Christians  . TOTAL HIP ARTHROPLASTY Left 12/11/2015   Procedure: TOTAL HIP ARTHROPLASTY ANTERIOR APPROACH;  Surgeon: Hessie Knows, MD;  Location: [facility];  Service: Orthopedics;  Laterality: Left;  . TOTAL KNEE ARTHROPLASTY Right 03/11/2016   Procedure: TOTAL KNEE ARTHROPLASTY;  Surgeon: Hessie Knows, MD;  Location: [facility];  Service: Orthopedics;  Laterality: Right;    Social History Social History   Tobacco Use  . Smoking status: Never Smoker  . Smokeless tobacco: Never Used  Substance Use Topics  . Alcohol use: No  Alcohol/week: 0.0 standard drinks  . Drug use: No    Family History Family History  Problem Relation Age of Onset  . Diabetes Mother   . Hyperlipidemia Mother   . Hypertension Mother   . Diabetes Father   . Hyperlipidemia Father   . Hypertension Father   . Obesity Father   . Hypertension Sister   . Hyperlipidemia Sister   . Hyperlipidemia Sister   . Breast cancer Neg Hx     Allergies  Allergen Reactions  . Lisinopril Swelling     REVIEW OF SYSTEMS (Negative unless checked)  Constitutional: [] Weight loss  [] Fever  [] Chills Cardiac: [] Chest  pain   [] Chest pressure   [] Palpitations   [] Shortness of breath when laying flat   [] Shortness of breath with exertion. Vascular:  [] Pain in legs with walking   [x] Pain in legs at rest  [x] History of DVT   [] Phlebitis   [x] Swelling in legs   [] Varicose veins   [] Non-healing ulcers Pulmonary:   [] Uses home oxygen   [] Productive cough   [] Hemoptysis   [] Wheeze  [] COPD   [] Asthma Neurologic:  [] Dizziness   [] Seizures   [] History of stroke   [] History of TIA  [] Aphasia   [] Vissual changes   [] Weakness or numbness in arm   [] Weakness or numbness in leg Musculoskeletal:   [] Joint swelling   [x] Joint pain   [] Low back pain Hematologic:  [] Easy bruising  [] Easy bleeding   [x] Hypercoagulable state   [] Anemic Gastrointestinal:  [] Diarrhea   [] Vomiting  [] Gastroesophageal reflux/heartburn   [] Difficulty swallowing. Genitourinary:  [] Chronic kidney disease   [] Difficult urination  [] Frequent urination   [] Blood in urine Skin:  [] Rashes   [] Ulcers  Psychological:  [] History of anxiety   []  History of major depression.  Physical Examination  There were no vitals filed for this visit. There is no height or weight on file to calculate BMI. Gen: WD/WN, NAD Head: [place]/AT, No temporalis wasting.  Ear/Nose/Throat: Hearing grossly intact, nares w/o erythema or drainage Eyes: PER, EOMI, sclera nonicteric.  Neck: Supple, no large masses.   Pulmonary:  Good air movement, no audible wheezing bilaterally, no use of accessory muscles.  Cardiac: RRR, no JVD Vascular: scattered varicosities present bilaterally.  Mild venous stasis changes to the legs bilaterally.  3-4+ firm pitting edema; right side has an Haematologist which is intact Vessel Right Left  Radial Palpable Palpable  Gastrointestinal: Non-distended. No guarding/no peritoneal signs.  Musculoskeletal: M/S 5/5 throughout.  No deformity or atrophy.  Neurologic: CN 2-12 intact. Symmetrical.  Speech is fluent. Motor exam as listed above. Psychiatric: Judgment  intact, Mood & affect appropriate for pt's clinical situation. Dermatologic: No rashes or ulcers noted.  No changes consistent with cellulitis.  CBC Lab Results  Component Value Date   WBC 9.4 11/04/2019   HGB 9.6 (L) 11/04/2019   HCT 30.1 (L) 11/04/2019   MCV 90.7 11/04/2019   PLT 194 11/04/2019    BMET    Component Value Date/Time   NA 139 11/04/2019 0409   NA 142 11/26/2015 1122   NA 141 10/18/2012 1716   K 3.4 (L) 11/04/2019 0409   K 3.5 10/18/2012 1716   CL 106 11/04/2019 0409   CL 110 (H) 10/18/2012 1716   CO2 26 11/04/2019 0409   CO2 24 10/18/2012 1716   GLUCOSE 97 11/04/2019 0409   GLUCOSE 128 (H) 10/18/2012 1716   BUN 9 11/04/2019 0409   BUN 27 11/26/2015 1122   BUN 31 (H) 10/18/2012 1716  CREATININE 0.76 11/04/2019 0409   CREATININE 1.14 (H) 08/23/2019 0000   CALCIUM 8.4 (L) 11/04/2019 0409   CALCIUM 7.9 (L) 10/18/2012 1716   GFRNONAA >60 11/04/2019 0409   GFRNONAA 48 (L) 08/23/2019 0000   GFRAA >60 11/04/2019 0409   GFRAA 55 (L) 08/23/2019 0000   CrCl cannot be calculated (Patient's most recent lab result is older than the maximum 21 days allowed.).  COAG Lab Results  Component Value Date   INR 1.1 11/02/2019   INR 1.2 10/28/2019   INR 1.13 11/17/2017    Radiology No results found.   Assessment/Plan 1. Acute deep vein thrombosis (DVT) of popliteal vein of right lower extremity (HCC) Recommend:   No surgery or intervention at this point in time.  IVC filter is present and removal will be discussed when she returns in August.  Patient's previous duplex ultrasound of the venous system shows DVT in the popliteal and tibial veins.  The patient did not tolerate anticoagulation   Elevation was stressed, use of a recliner was discussed.  I have had a long discussion with the patient regarding DVT and post phlebitic changes such as swelling and why it  causes symptoms such as pain.  The patient will wear graduated compression stockings or  compression wraps class 1 (20-30 mmHg), beginning after three full days of anticoagulation, on a daily basis a prescription was given. The patient will  beginning wearing the stockings first thing in the Trevino and removing them in the evening. The patient is instructed specifically not to sleep in the stockings.  In addition, behavioral modification including elevation during the day and avoidance of prolonged dependency will be initiated.  At the present time she is using an Haematologist on the right I have recommended compression wraps for the left and given her a prescription with instructions to evaluate these at [facility]  The patient will continue anticoagulation for now as there have not been any problems or complications at this point.   - VAS [place] LOWER EXTREMITY VENOUS (DVT); Future  2. Chronic venous insufficiency See #1 - VAS [place] LOWER EXTREMITY VENOUS (DVT); Future  3. Type 2 diabetes mellitus with diabetic neuropathy, without long-term current use of insulin (HCC) Continue hypoglycemic medications as already ordered, these medications have been reviewed and there are no changes at this time.  Hgb A1C to be monitored as already arranged by primary service   4. Dyslipidemia Continue statin as ordered and reviewed, no changes at this time    Hortencia Pilar, MD  12/05/2019 1:05 PM

## 2019-12-06 DIAGNOSIS — S82891G Other fracture of right lower leg, subsequent encounter for closed fracture with delayed healing: Secondary | ICD-10-CM | POA: Diagnosis not present

## 2019-12-06 DIAGNOSIS — T8450XD Infection and inflammatory reaction due to unspecified internal joint prosthesis, subsequent encounter: Secondary | ICD-10-CM | POA: Diagnosis not present

## 2019-12-06 DIAGNOSIS — M009 Pyogenic arthritis, unspecified: Secondary | ICD-10-CM | POA: Diagnosis not present

## 2019-12-06 DIAGNOSIS — B951 Streptococcus, group B, as the cause of diseases classified elsewhere: Secondary | ICD-10-CM | POA: Diagnosis not present

## 2019-12-06 DIAGNOSIS — R7881 Bacteremia: Secondary | ICD-10-CM | POA: Diagnosis not present

## 2019-12-13 ENCOUNTER — Telehealth: Payer: Self-pay | Admitting: Infectious Diseases

## 2019-12-13 NOTE — Telephone Encounter (Signed)
Patient saw Becky Trevino on 12/06/19

## 2019-12-13 NOTE — Telephone Encounter (Signed)
Patient's husband called back - Said she is at [facility]  Her cell number is [phone]

## 2019-12-13 NOTE — Telephone Encounter (Signed)
Called to schedule an appointment   No answer - left message

## 2019-12-18 IMAGING — MG MM DIGITAL SCREENING BILAT W/ TOMO W/ CAD
8 of 14 series · 8 of 30 positions shown · non-contrast
Comparison: Previous exam(s).

CLINICAL DATA: Screening.

EXAM:
2D DIGITAL SCREENING BILATERAL MAMMOGRAM WITH 3D TOMO WITH CAD

[L CV]
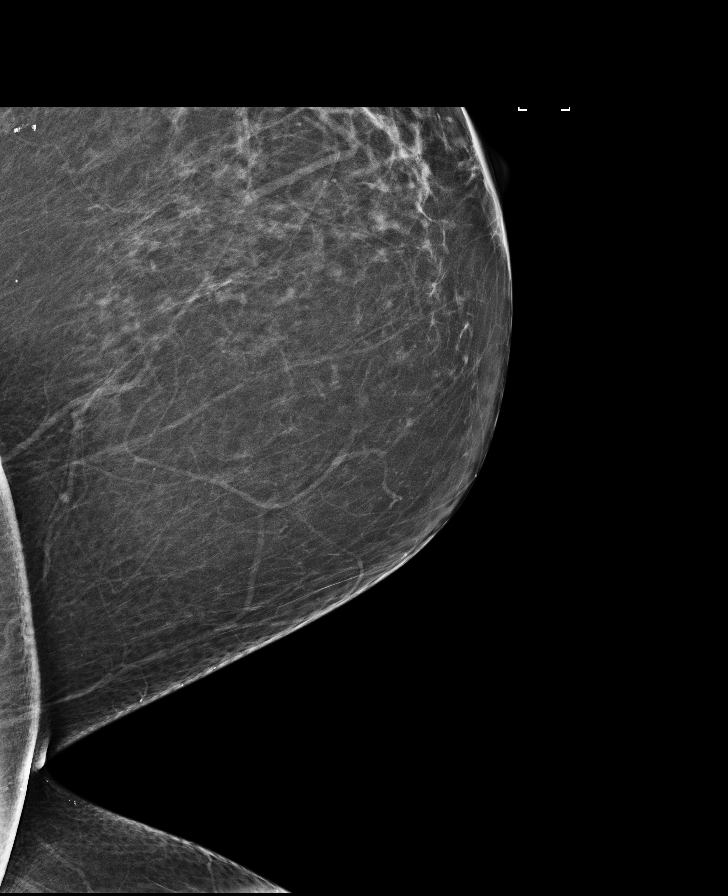

[R MLO (1 of 2)]
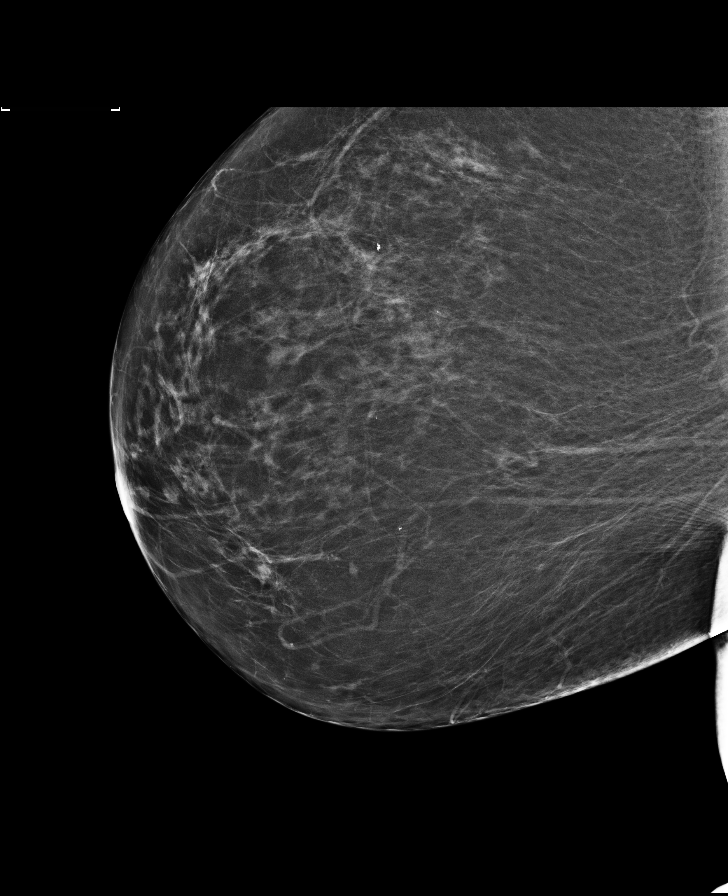

[L MLO]
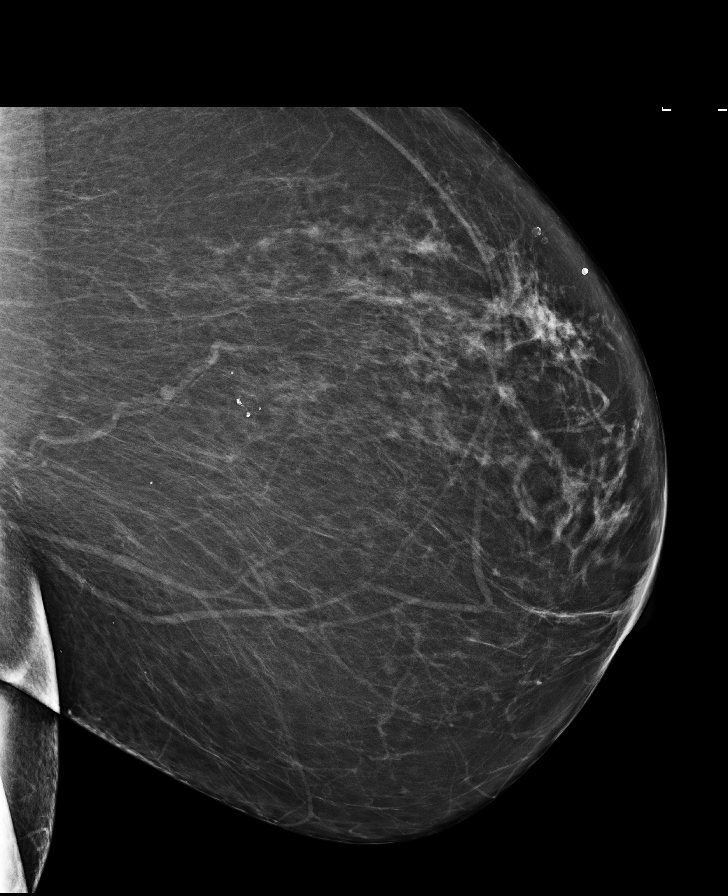

[R MLO (2 of 2)]
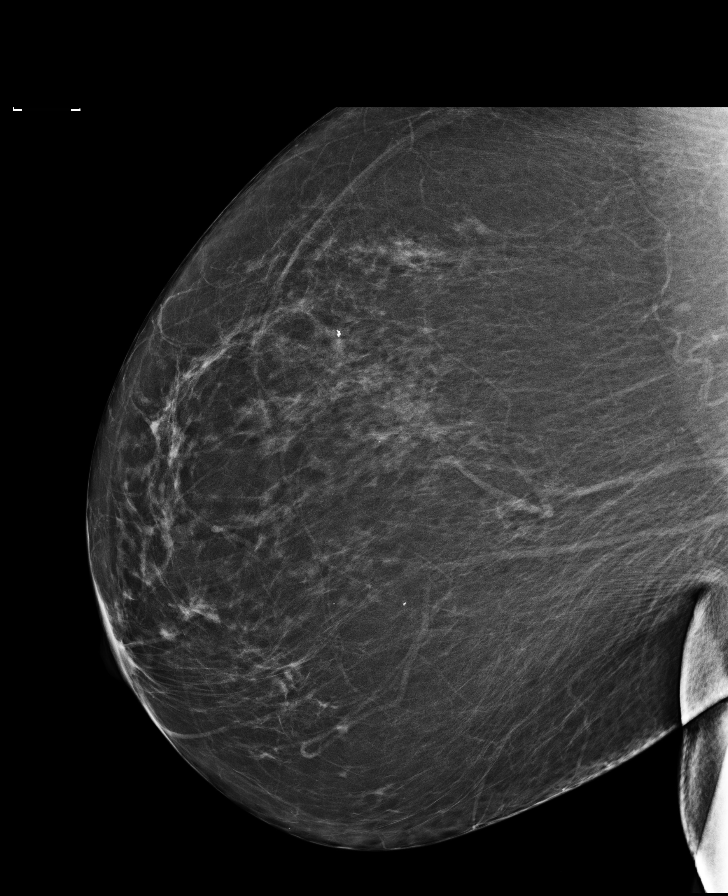

[L MLO synth-2D]
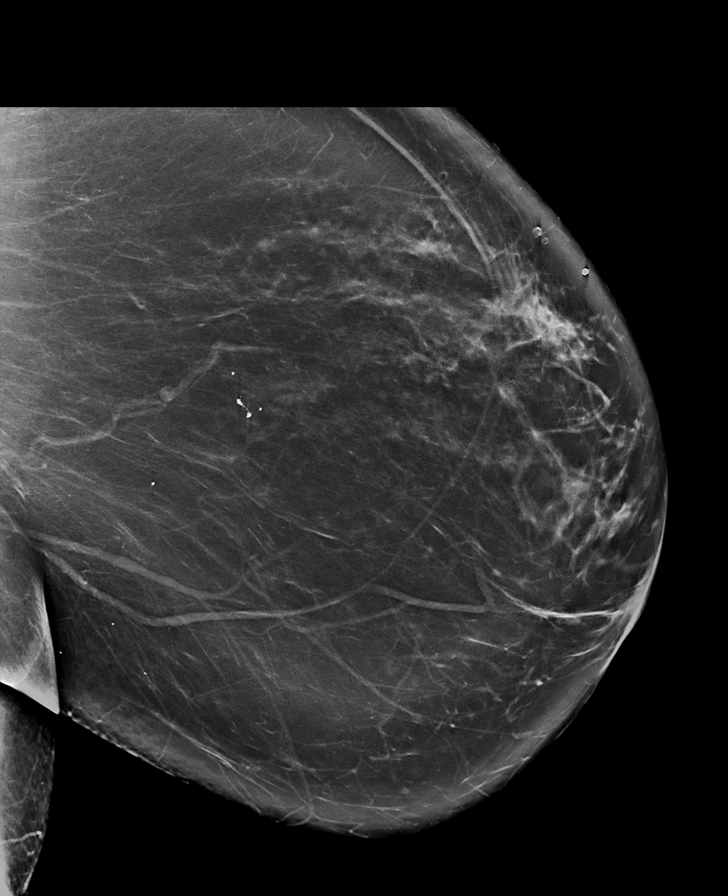

[R CC]
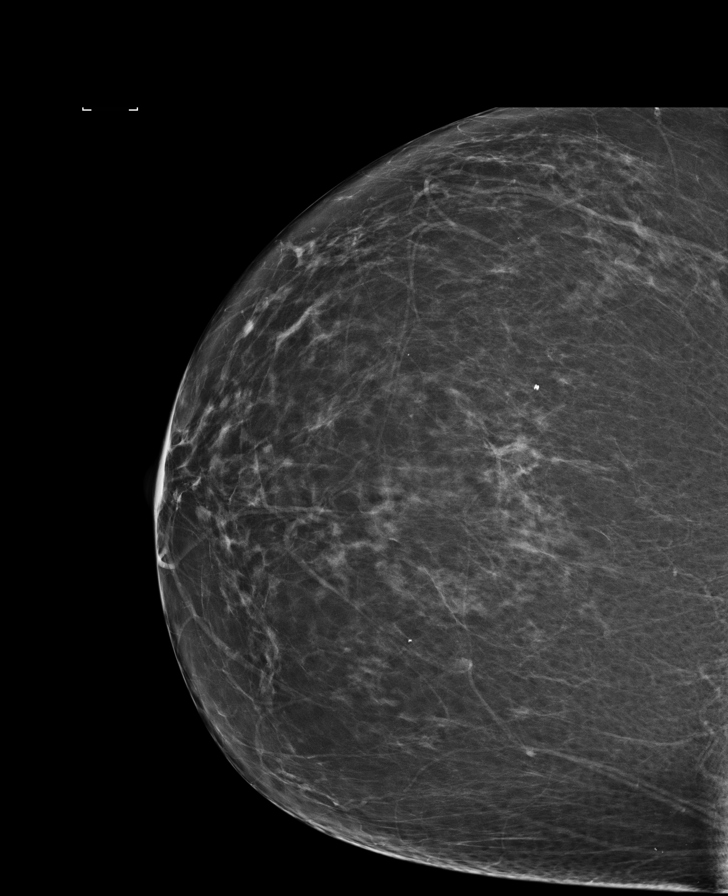

[L CC]
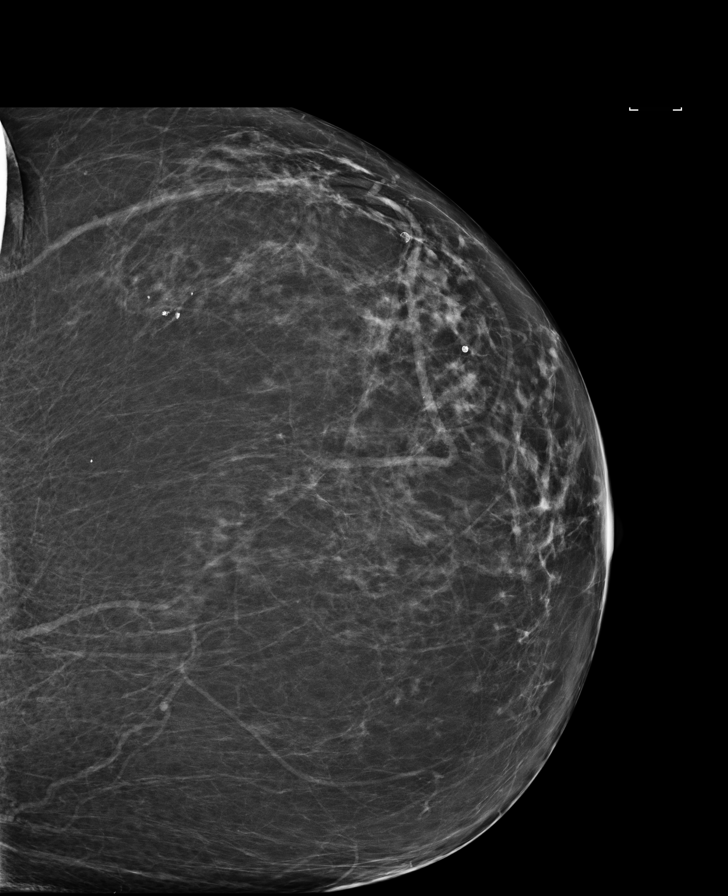

[R MLO synth-2D]
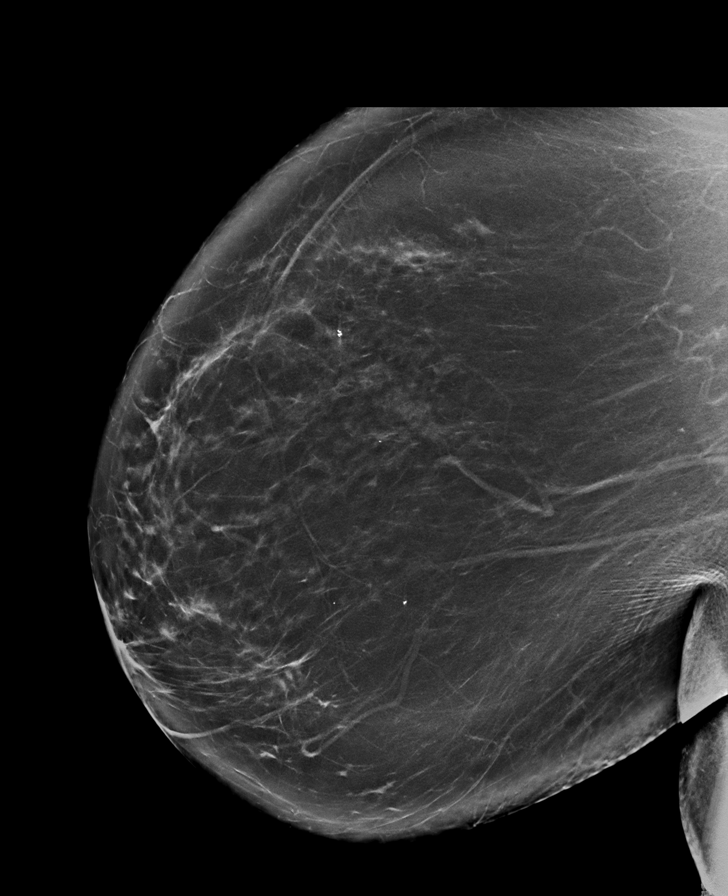

[8 of 30 positions shown; findings below may reference images not displayed]

ACR Breast Density Category b: There are scattered areas of
fibroglandular density.
FINDINGS: There are no findings suspicious for malignancy. Images were
processed with CAD.
IMPRESSION: No mammographic evidence of malignancy. A result letter of this
screening mammogram will be mailed directly to the patient.

RECOMMENDATION:
Screening mammogram in one year. (Code:[id number])

BI-RADS CATEGORY  1: Negative.

## 2019-12-20 DIAGNOSIS — E1159 Type 2 diabetes mellitus with other circulatory complications: Secondary | ICD-10-CM | POA: Diagnosis not present

## 2019-12-20 DIAGNOSIS — I152 Hypertension secondary to endocrine disorders: Secondary | ICD-10-CM | POA: Diagnosis not present

## 2019-12-20 DIAGNOSIS — E1140 Type 2 diabetes mellitus with diabetic neuropathy, unspecified: Secondary | ICD-10-CM | POA: Diagnosis not present

## 2019-12-20 DIAGNOSIS — N1831 Chronic kidney disease, stage 3a: Secondary | ICD-10-CM | POA: Diagnosis not present

## 2019-12-21 ENCOUNTER — Other Ambulatory Visit: Payer: Self-pay | Admitting: Orthopedic Surgery

## 2019-12-21 DIAGNOSIS — G5722 Lesion of femoral nerve, left lower limb: Secondary | ICD-10-CM

## 2019-12-23 DIAGNOSIS — M7989 Other specified soft tissue disorders: Secondary | ICD-10-CM | POA: Diagnosis not present

## 2019-12-23 DIAGNOSIS — S82891G Other fracture of right lower leg, subsequent encounter for closed fracture with delayed healing: Secondary | ICD-10-CM | POA: Diagnosis not present

## 2019-12-23 DIAGNOSIS — R262 Difficulty in walking, not elsewhere classified: Secondary | ICD-10-CM | POA: Diagnosis not present

## 2019-12-23 DIAGNOSIS — E1142 Type 2 diabetes mellitus with diabetic polyneuropathy: Secondary | ICD-10-CM | POA: Diagnosis not present

## 2019-12-23 DIAGNOSIS — I739 Peripheral vascular disease, unspecified: Secondary | ICD-10-CM | POA: Diagnosis not present

## 2019-12-29 ENCOUNTER — Other Ambulatory Visit: Payer: Self-pay | Admitting: Orthopedic Surgery

## 2019-12-29 DIAGNOSIS — G5722 Lesion of femoral nerve, left lower limb: Secondary | ICD-10-CM

## 2020-01-09 ENCOUNTER — Ambulatory Visit
Admission: RE | Admit: 2020-01-09 | Discharge: 2020-01-09 | Disposition: A | Discharge status: Home / Self Care | Payer: BC Managed Care – PPO | Source: Ambulatory Visit | Attending: Orthopedic Surgery | Admitting: Orthopedic Surgery

## 2020-01-09 ENCOUNTER — Other Ambulatory Visit: Payer: Self-pay

## 2020-01-09 DIAGNOSIS — G5722 Lesion of femoral nerve, left lower limb: Secondary | ICD-10-CM

## 2020-01-11 DIAGNOSIS — G4733 Obstructive sleep apnea (adult) (pediatric): Secondary | ICD-10-CM | POA: Diagnosis not present

## 2020-01-20 DIAGNOSIS — I152 Hypertension secondary to endocrine disorders: Secondary | ICD-10-CM | POA: Diagnosis not present

## 2020-01-20 DIAGNOSIS — E1159 Type 2 diabetes mellitus with other circulatory complications: Secondary | ICD-10-CM | POA: Diagnosis not present

## 2020-01-20 DIAGNOSIS — L03115 Cellulitis of right lower limb: Secondary | ICD-10-CM | POA: Diagnosis not present

## 2020-01-20 DIAGNOSIS — E1140 Type 2 diabetes mellitus with diabetic neuropathy, unspecified: Secondary | ICD-10-CM | POA: Diagnosis not present

## 2020-01-20 DIAGNOSIS — N1831 Chronic kidney disease, stage 3a: Secondary | ICD-10-CM | POA: Diagnosis not present

## 2020-01-25 DIAGNOSIS — E1140 Type 2 diabetes mellitus with diabetic neuropathy, unspecified: Secondary | ICD-10-CM | POA: Diagnosis not present

## 2020-01-25 DIAGNOSIS — L03115 Cellulitis of right lower limb: Secondary | ICD-10-CM | POA: Diagnosis not present

## 2020-01-25 DIAGNOSIS — R2681 Unsteadiness on feet: Secondary | ICD-10-CM | POA: Diagnosis not present

## 2020-01-27 ENCOUNTER — Other Ambulatory Visit: Payer: Self-pay

## 2020-01-27 ENCOUNTER — Ambulatory Visit (INDEPENDENT_AMBULATORY_CARE_PROVIDER_SITE_OTHER): Payer: BC Managed Care – PPO | Admitting: Family Medicine

## 2020-01-27 ENCOUNTER — Encounter: Payer: Self-pay | Admitting: Family Medicine

## 2020-01-27 VITALS — BP 140/80 | HR 67 | Temp 97.3°F | Resp 16 | Ht 71.0 in

## 2020-01-27 DIAGNOSIS — E785 Hyperlipidemia, unspecified: Secondary | ICD-10-CM | POA: Diagnosis not present

## 2020-01-27 DIAGNOSIS — N183 Chronic kidney disease, stage 3 unspecified: Secondary | ICD-10-CM

## 2020-01-27 DIAGNOSIS — M8949 Other hypertrophic osteoarthropathy, multiple sites: Secondary | ICD-10-CM

## 2020-01-27 DIAGNOSIS — I272 Pulmonary hypertension, unspecified: Secondary | ICD-10-CM

## 2020-01-27 DIAGNOSIS — G4733 Obstructive sleep apnea (adult) (pediatric): Secondary | ICD-10-CM

## 2020-01-27 DIAGNOSIS — I82431 Acute embolism and thrombosis of right popliteal vein: Secondary | ICD-10-CM

## 2020-01-27 DIAGNOSIS — E1140 Type 2 diabetes mellitus with diabetic neuropathy, unspecified: Secondary | ICD-10-CM | POA: Diagnosis not present

## 2020-01-27 DIAGNOSIS — M009 Pyogenic arthritis, unspecified: Secondary | ICD-10-CM

## 2020-01-27 DIAGNOSIS — R202 Paresthesia of skin: Secondary | ICD-10-CM

## 2020-01-27 DIAGNOSIS — J3089 Other allergic rhinitis: Secondary | ICD-10-CM

## 2020-01-27 DIAGNOSIS — E538 Deficiency of other specified B group vitamins: Secondary | ICD-10-CM

## 2020-01-27 DIAGNOSIS — I10 Essential (primary) hypertension: Secondary | ICD-10-CM

## 2020-01-27 DIAGNOSIS — M159 Polyosteoarthritis, unspecified: Secondary | ICD-10-CM

## 2020-01-27 DIAGNOSIS — R600 Localized edema: Secondary | ICD-10-CM

## 2020-01-27 DIAGNOSIS — D509 Iron deficiency anemia, unspecified: Secondary | ICD-10-CM

## 2020-01-27 LAB — POCT GLYCOSYLATED HEMOGLOBIN (HGB A1C): Hemoglobin A1C: 5.1 % (ref 4.0–5.6)

## 2020-01-27 MED ORDER — BLOOD GLUCOSE METER KIT: PACK | 0 refills | Status: DC

## 2020-01-27 MED ORDER — FLUTICASONE PROPIONATE 50 MCG/ACT NA SUSP: 2.0000 | Freq: Every day | NASAL | 2 refills | Status: DC

## 2020-01-27 MED ORDER — ATORVASTATIN CALCIUM 40 MG PO TABS: 40.0000 mg | ORAL_TABLET | Freq: Every evening | ORAL | 1 refills | Status: DC

## 2020-01-27 NOTE — Progress Notes (Signed)
Name: Becky Trevino   MRN: [id number]    DOB: 1946/04/20   Date:01/27/2020       Progress Note  Subjective  Chief Complaint  Chief Complaint  Patient presents with  . Medication Refill  . Diabetes  . Hypertension  . Hyperlipidemia  . Medication Management    Should she start back on Lasix and Potassium.    HPI  DMII with renal manifestation and obesity: she has urinary frequency from urinary incontinence, denies polyphagia or polydipsia. A1C is normal, we will check urine micro.   Obesity Morbid: she has been unable to be active, too many orthopedic problems, but she has been trying to eat healthy   Hypotension : hypertension resolved, bp has dropped and she is on Midodrine . She is concerned because she is still taking Metoprolol 100 mg.   History of right DVT of right popliteal vein: diagnosed 10/27/2019, she has not been taking Xarelto since she went to rehab, she has not been on Lovenox, likely from left iliopsoas hematoma, she still has some swelling of right thigh, we will doppler US , she has an IV filter   Hyperlipidemia: taking Atorvastatin daily and denies side effects. No chest pain , no muscles pain. We will recheck labs today   Insomnia/OSA: She has been using a hospital bed and unable to wear CPAP lately, sleeping well and not taking trazodone.  Last Echo done by Dr. Clayborn Bigness showed mild pulmonary hypertension   OA: multi joints, sees Ortho, Dr. Rudene Christians . She had revision of right knee replacement March 2019 because of infection. Still under the care of Ortho now also seeing Dr. Ola Spurr ,no redness on right knee but has swelling on right posterior thigh since admission to [facility] Feb [address] but going down, she has follow up with Dr. Rudene Christians this month for further evaluation   S/p ankle fracture: slipped on her bathroom rug on Dec 12 th, 2020, she had left right ankle surgical repair on 09/02/2019. Right ankle is fine  Left femoral nerve palsy:  seen by Dr. Rudene Christians,  Dr. Manuella Ghazi, waiting for NCS, unable to walk without assistance, wearing brace because left knee is unstable, she needs assistance bathing, cannot drive, cannot transfer. She was diagnosed with a hematoma of left iliopsoas during hospital stay 10/2019 , but she had repeat  CT pelvis 01/09/2020 that showed resolution of hematoma.   Patient Active Problem List   Diagnosis Date Noted  . Foot ulcer, right, limited to breakdown of skin ([place]) 12/02/2019  . Acute pain of left thigh   . Bacteremia due to group B Streptococcus 10/30/2019  . Acute deep vein thrombosis (DVT) of right lower extremity ([place]) 10/30/2019  . Cellulitis of right lower extremity 10/27/2019  . Hematoma   . Syncope 08/07/2019  . Anemia 08/07/2019  . Chronic infection of right knee ([place]) 11/25/2018  . Infection and inflammatory reaction due to internal joint prosthesis, subsequent encounter 01/25/2018  . Infection of total right knee replacement ([place]) 11/19/2017  . Type 2 diabetes mellitus with diabetic neuropathy, without long-term current use of insulin ([place]) 11/06/2016  . Primary osteoarthritis of knee 03/11/2016  . Primary osteoarthritis of left hip 12/11/2015  . Benign essential HTN 04/21/2015  . Edema leg 04/21/2015  . Chronic kidney disease (CKD), stage III (moderate) 04/21/2015  . Chronic venous insufficiency 04/21/2015  . DDD (degenerative disc disease), cervical 04/21/2015  . Diabetes mellitus with renal manifestation ([place]) 04/21/2015  . Dyslipidemia 04/21/2015  . Bladder cystocele 04/21/2015  .  Urinary incontinence 04/21/2015  . Leg varices 04/21/2015  . Insomnia 04/21/2015  . Eczema intertrigo 04/21/2015  . Obstructive apnea 04/21/2015  . Menopausal and perimenopausal disorder 04/21/2015  . Supraventricular premature beats 04/21/2015  . Osteoarthritis of multiple joints 04/21/2015  . H/O Spinal surgery 11/14/2013  . Detrusor muscle hypertonia 11/07/2013    Past Surgical History:  Procedure Laterality Date   . ABDOMINAL HYSTERECTOMY  1993   Total  . BUNIONECTOMY Bilateral 1993  . I & D KNEE WITH POLY EXCHANGE Right 11/19/2017   Procedure: RIGHT KNEE POLY EXCHANGE WITH IRRIGATION AND DEBRIDEMENT;  Surgeon: Hessie Knows, MD;  Location: [facility] ORS;  Service: Orthopedics;  Laterality: Right;  . JOINT REPLACEMENT     bilateral knee  . LAMINECTOMY  11/14/2013    Cervical Fusion , [facility], Dr. Delilah Shan  . LASER ABLATION Bilateral 07/29/2012   Dr. Lucky Cowboy  . LOWER EXTREMITY ANGIOGRAPHY Right 11/02/2019   Procedure: Lower Extremity Angiography;  Surgeon: Katha Cabal, MD;  Location: [place] [facility];  Service: Cardiovascular;  Laterality: Right;  . ORIF ANKLE FRACTURE Right 09/02/2019   Procedure: OPEN REDUCTION INTERNAL FIXATION (ORIF) ANKLE FRACTURE BIMALLEOLAR;  Surgeon: [name], DPM;  Location: [facility] OR[facility];  Service: Podiatry;  Laterality: Right;  . REPLACEMENT TOTAL KNEE Left 03/2010   Dr. Rudene Christians  . TOTAL HIP ARTHROPLASTY Left 12/11/2015   Procedure: TOTAL HIP ARTHROPLASTY ANTERIOR APPROACH;  Surgeon: Hessie Knows, MD;  Location: [facility] OR[facility];  Service: Orthopedics;  Laterality: Left;  . TOTAL KNEE ARTHROPLASTY Right 03/11/2016   Procedure: TOTAL KNEE ARTHROPLASTY;  Surgeon: Hessie Knows, MD;  Location: [facility];  Service: Orthopedics;  Laterality: Right;    Family History  Problem Relation Age of Onset  . Diabetes Mother   . Hyperlipidemia Mother   . Hypertension Mother   . Diabetes Father   . Hyperlipidemia Father   . Hypertension Father   . Obesity Father   . Hypertension Sister   . Hyperlipidemia Sister   . Hyperlipidemia Sister   . Breast cancer Neg Hx     Social History   Tobacco Use  . Smoking status: Never Smoker  . Smokeless tobacco: Never Used  Substance Use Topics  . Alcohol use: No    Alcohol/week: 0.0 standard drinks     Current Outpatient Medications:  .  acetaminophen (TYLENOL) 650 MG CR tablet, Take 650-1,300 mg by mouth 2 (two) times daily as needed for pain., Disp:  , Rfl:  .  atorvastatin (LIPITOR) 40 MG tablet, Take 1 tablet (40 mg total) by mouth daily at 6 PM. (Patient taking differently: Take 40 mg by mouth every evening. ), Disp: 90 tablet, Rfl: 1 .  Calcium Carbonate-Vitamin D (CALCIUM 600-D) 600-400 MG-UNIT per tablet, Take 1 tablet by mouth 2 (two) times daily., Disp: , Rfl:  .  doxycycline (VIBRAMYCIN) 100 MG capsule, Take 100 mg by mouth 2 (two) times daily., Disp: , Rfl:  .  fluticasone (FLONASE) 50 MCG/ACT nasal spray, Place 2 sprays into both nostrils daily., Disp: 16 g, Rfl: 2 .  glucose blood (ONE TOUCH ULTRA TEST) test strip, Use as instructed, Disp: 100 each, Rfl: 12 .  HYDROcodone-acetaminophen (NORCO/VICODIN) 5-325 MG tablet, Take 1 tablet by mouth every 6 (six) hours as needed for moderate pain or severe pain., Disp: 20 tablet, Rfl: 0 .  imipramine (TOFRANIL) 25 MG tablet, Take 1 tablet (25 mg total) by mouth at bedtime., Disp: 90 tablet, Rfl: 3 .  ketoconazole (NIZORAL) 2 % cream, Apply 1 application  topically 2 (two) times daily as needed (rash). , Disp: , Rfl:  .  Magnesium Oxide 500 MG CAPS, Take 500 mg by mouth daily. , Disp: , Rfl:  .  metoprolol succinate (TOPROL-XL) 100 MG 24 hr tablet, TAKE 1 TABLET (100 MG TOTAL) BY MOUTH EVERY EVENING. TAKE WITH OR IMMEDIATELY FOLLOWING A MEAL., Disp: 90 tablet, Rfl: 1 .  midodrine (PROAMATINE) 5 MG tablet, Take 1 tablet (5 mg total) by mouth 3 (three) times daily with meals. (Patient taking differently: Take 5 mg by mouth 3 (three) times daily. ), Disp: 90 tablet, Rfl: 0 .  mirabegron ER (MYRBETRIQ) 25 MG TB24 tablet, Take 1 tablet (25 mg total) by mouth daily., Disp: 90 tablet, Rfl: 3 .  Netarsudil-Latanoprost (ROCKLATAN) 0.02-0.005 % SOLN, Place 1 drop into both eyes at bedtime., Disp: , Rfl:  .  SIMBRINZA 1-0.2 % SUSP, Place 1 drop into both eyes 2 (two) times daily., Disp: , Rfl:  .  furosemide (LASIX) 20 MG tablet, Take 20 mg by mouth every evening., Disp: , Rfl:  .  potassium chloride  (KLOR-CON) 10 MEQ tablet, Take 10 mEq by mouth daily., Disp: , Rfl:   Allergies  Allergen Reactions  . Lisinopril Swelling    I personally reviewed active problem list, medication list, allergies, family history, social history, health maintenance, notes from last encounter with the patient/caregiver today.   ROS  Constitutional: Negative for fever , positive for  weight change.  Respiratory: Negative for cough and shortness of breath.   Cardiovascular: Negative for chest pain or palpitations.  Gastrointestinal: Negative for abdominal pain, no bowel changes.  Musculoskeletal: Positive  for gait problem and bilateral knee edema joint swelling.  Skin: Negative for rash.  Neurological: Negative for dizziness or headache.  No other specific complaints in a complete review of systems (except as listed in HPI above).  Objective  Vitals:   01/27/20 1511  BP: 140/80  Pulse: 67  Resp: 16  Temp: (!) 97.3 F (36.3 C)  TempSrc: Temporal  SpO2: 98%  Height: 5' 11"  (1.803 m)    Body mass index is 35.73 kg/m.  Physical Exam  Constitutional: Patient appears well-developed and well-nourished. Obese  No distress.  HEENT: head atraumatic, normocephalic, pupils equal and reactive to light,  neck supple, oral mucosa not checked - wearing a mask  Cardiovascular: Normal rate, regular rhythm and normal heart sounds.  No murmur heard. BLE edema. Some induration on right thigh with thickness of posterior thigh but no redness or increase in warmth. Non tender, but she states feels tight  Pulmonary/Chest: Effort normal, coarse crackles on bases, discussed spirometry  No respiratory distress. Abdominal: Soft.  There is no tenderness. Psychiatric: Patient has a normal mood and affect. behavior is normal. Judgment and thought content normal. Muscular skeletal: she was sitting on wheelchair, difficulty raising left leg, left knee in the valgum position, very large right knee and right thigh visibly  larger than left side  Recent Results (from the past 2160 hour(s))  Glucose, capillary     Status: None   Collection Time: 10/29/19  5:11 PM  Result Value Ref Range   Glucose-Capillary 78 70 - 99 mg/dL   Comment 1 Notify RN   Glucose, capillary     Status: None   Collection Time: 10/29/19  9:27 PM  Result Value Ref Range   Glucose-Capillary 74 70 - 99 mg/dL  CBC     Status: Abnormal   Collection Time: 10/30/19  5:02 AM  Result  Value Ref Range   WBC 7.3 4.0 - 10.5 K/uL   RBC 3.72 (L) 3.87 - 5.11 MIL/uL   Hemoglobin 11.0 (L) 12.0 - 15.0 g/dL   HCT 34.7 (L) 36.0 - 46.0 %   MCV 93.3 80.0 - 100.0 fL   MCH 29.6 26.0 - 34.0 pg   MCHC 31.7 30.0 - 36.0 g/dL   RDW 16.9 (H) 11.5 - 15.5 %   Platelets 118 (L) 150 - 400 K/uL    Comment: REPEATED TO VERIFY SPECIMEN CHECKED FOR CLOTS Immature Platelet Fraction may be clinically indicated, consider ordering this additional test [id number] CONSISTENT WITH PREVIOUS RESULT    nRBC 0.0 0.0 - 0.2 %    Comment: Performed at [facility], [address] [place], [place] [address]  Basic metabolic panel     Status: Abnormal   Collection Time: 10/30/19  5:02 AM  Result Value Ref Range   Sodium 139 135 - 145 mmol/L   Potassium 3.9 3.5 - 5.1 mmol/L   Chloride 105 98 - 111 mmol/L   CO2 27 22 - 32 mmol/L   Glucose, Bld 90 70 - 99 mg/dL   BUN 12 8 - 23 mg/dL   Creatinine, Ser 1.04 (H) 0.44 - 1.00 mg/dL   Calcium 8.8 (L) 8.9 - 10.3 mg/dL   GFR calc non Af Amer 53 (L) >60 mL/min   GFR calc Af Amer >60 >60 mL/min   Anion gap 7 5 - 15    Comment: Performed at [facility], [address] [place], [place] [address]  Glucose, capillary     Status: None   Collection Time: 10/30/19  7:37 AM  Result Value Ref Range   Glucose-Capillary 82 70 - 99 mg/dL  Glucose, capillary     Status: None   Collection Time: 10/30/19 11:45 AM  Result Value Ref Range   Glucose-Capillary 79 70 - 99 mg/dL  Glucose, capillary     Status: None    Collection Time: 10/30/19  9:22 PM  Result Value Ref Range   Glucose-Capillary 96 70 - 99 mg/dL  CBC     Status: Abnormal   Collection Time: 10/31/19  5:26 AM  Result Value Ref Range   WBC 6.9 4.0 - 10.5 K/uL   RBC 3.85 (L) 3.87 - 5.11 MIL/uL   Hemoglobin 11.2 (L) 12.0 - 15.0 g/dL   HCT 34.9 (L) 36.0 - 46.0 %   MCV 90.6 80.0 - 100.0 fL   MCH 29.1 26.0 - 34.0 pg   MCHC 32.1 30.0 - 36.0 g/dL   RDW 16.6 (H) 11.5 - 15.5 %   Platelets 146 (L) 150 - 400 K/uL   nRBC 0.0 0.0 - 0.2 %    Comment: Performed at [facility], [address] [place], [place] [address]  Basic metabolic panel     Status: Abnormal   Collection Time: 10/31/19  5:26 AM  Result Value Ref Range   Sodium 138 135 - 145 mmol/L   Potassium 3.5 3.5 - 5.1 mmol/L   Chloride 105 98 - 111 mmol/L   CO2 24 22 - 32 mmol/L   Glucose, Bld 90 70 - 99 mg/dL   BUN 11 8 - 23 mg/dL   Creatinine, Ser 0.93 0.44 - 1.00 mg/dL   Calcium 8.8 (L) 8.9 - 10.3 mg/dL   GFR calc non Af Amer >60 >60 mL/min   GFR calc Af Amer >60 >60 mL/min   Anion gap 9 5 - 15  Comment: Performed at [facility], [address] [place], [place] [address]  Glucose, capillary     Status: None   Collection Time: 10/31/19  7:55 AM  Result Value Ref Range   Glucose-Capillary 85 70 - 99 mg/dL  Glucose, capillary     Status: None   Collection Time: 10/31/19 11:42 AM  Result Value Ref Range   Glucose-Capillary 95 70 - 99 mg/dL  Glucose, capillary     Status: None   Collection Time: 10/31/19  4:18 PM  Result Value Ref Range   Glucose-Capillary 98 70 - 99 mg/dL  Glucose, capillary     Status: Abnormal   Collection Time: 10/31/19  9:28 PM  Result Value Ref Range   Glucose-Capillary 114 (H) 70 - 99 mg/dL  CBC     Status: Abnormal   Collection Time: 11/01/19  1:59 AM  Result Value Ref Range   WBC 9.9 4.0 - 10.5 K/uL   RBC 3.73 (L) 3.87 - 5.11 MIL/uL   Hemoglobin 10.9 (L) 12.0 - 15.0 g/dL   HCT 34.3 (L) 36.0 - 46.0 %   MCV 92.0 80.0 -  100.0 fL   MCH 29.2 26.0 - 34.0 pg   MCHC 31.8 30.0 - 36.0 g/dL   RDW 16.0 (H) 11.5 - 15.5 %   Platelets 158 150 - 400 K/uL   nRBC 0.0 0.0 - 0.2 %    Comment: Performed at [facility], [address] [place], [place] [address]  Basic metabolic panel     Status: Abnormal   Collection Time: 11/01/19  1:59 AM  Result Value Ref Range   Sodium 138 135 - 145 mmol/L   Potassium 3.8 3.5 - 5.1 mmol/L   Chloride 103 98 - 111 mmol/L   CO2 27 22 - 32 mmol/L   Glucose, Bld 107 (H) 70 - 99 mg/dL   BUN 9 8 - 23 mg/dL   Creatinine, Ser 0.89 0.44 - 1.00 mg/dL   Calcium 9.0 8.9 - 10.3 mg/dL   GFR calc non Af Amer >60 >60 mL/min   GFR calc Af Amer >60 >60 mL/min   Anion gap 8 5 - 15    Comment: Performed at [facility], [address] [place], [place] [address]  CK     Status: Abnormal   Collection Time: 11/01/19  1:59 AM  Result Value Ref Range   Total CK 541 (H) 38 - 234 U/L    Comment: Performed at [facility], [address] [place], [place] [address]  Glucose, capillary     Status: None   Collection Time: 11/01/19  4:41 AM  Result Value Ref Range   Glucose-Capillary 86 70 - 99 mg/dL  CK     Status: Abnormal   Collection Time: 11/01/19  6:30 AM  Result Value Ref Range   Total CK 654 (H) 38 - 234 U/L    Comment: Performed at [facility], [address] [place], [place] [address]  Glucose, capillary     Status: None   Collection Time: 11/01/19  7:53 AM  Result Value Ref Range   Glucose-Capillary 88 70 - 99 mg/dL  Glucose, capillary     Status: Abnormal   Collection Time: 11/01/19 12:00 PM  Result Value Ref Range   Glucose-Capillary 107 (H) 70 - 99 mg/dL  Glucose, capillary     Status: Abnormal   Collection Time: 11/01/19  4:52 PM  Result Value Ref Range   Glucose-Capillary 113 (H) 70 -  99 mg/dL  Glucose, capillary     Status: Abnormal   Collection Time: 11/01/19  8:17 PM  Result Value Ref Range   Glucose-Capillary 117 (H) 70 - 99 mg/dL   Glucose, capillary     Status: Abnormal   Collection Time: 11/01/19 11:43 PM  Result Value Ref Range   Glucose-Capillary 116 (H) 70 - 99 mg/dL  Glucose, capillary     Status: None   Collection Time: 11/02/19  3:31 AM  Result Value Ref Range   Glucose-Capillary 90 70 - 99 mg/dL  Protime-INR     Status: None   Collection Time: 11/02/19  6:52 AM  Result Value Ref Range   Prothrombin Time 14.0 11.4 - 15.2 seconds   INR 1.1 0.8 - 1.2    Comment: (NOTE) INR goal varies based on device and disease states. Performed at [facility], [address] [place], [place] [address]   APTT     Status: None   Collection Time: 11/02/19  6:52 AM  Result Value Ref Range   aPTT 36 24 - 36 seconds    Comment: Performed at [facility], [address] [place], [place] [address]  Basic metabolic panel     Status: Abnormal   Collection Time: 11/02/19  6:52 AM  Result Value Ref Range   Sodium 138 135 - 145 mmol/L   Potassium 3.4 (L) 3.5 - 5.1 mmol/L   Chloride 105 98 - 111 mmol/L   CO2 24 22 - 32 mmol/L   Glucose, Bld 89 70 - 99 mg/dL   BUN 10 8 - 23 mg/dL   Creatinine, Ser 0.94 0.44 - 1.00 mg/dL   Calcium 8.7 (L) 8.9 - 10.3 mg/dL   GFR calc non Af Amer 60 (L) >60 mL/min   GFR calc Af Amer >60 >60 mL/min   Anion gap 9 5 - 15    Comment: Performed at [facility], [address] [place], [place] [address]  CBC     Status: Abnormal   Collection Time: 11/02/19  6:52 AM  Result Value Ref Range   WBC 10.6 (H) 4.0 - 10.5 K/uL   RBC 3.51 (L) 3.87 - 5.11 MIL/uL   Hemoglobin 10.2 (L) 12.0 - 15.0 g/dL   HCT 32.5 (L) 36.0 - 46.0 %   MCV 92.6 80.0 - 100.0 fL   MCH 29.1 26.0 - 34.0 pg   MCHC 31.4 30.0 - 36.0 g/dL   RDW 16.6 (H) 11.5 - 15.5 %   Platelets 182 150 - 400 K/uL   nRBC 0.5 (H) 0.0 - 0.2 %    Comment: Performed at [facility], [address], [place], [place] [address]  Magnesium     Status: None   Collection Time: 11/02/19  6:52 AM  Result Value  Ref Range   Magnesium 2.2 1.7 - 2.4 mg/dL    Comment: Performed at [facility], [address] [place],  [address]  Glucose, capillary     Status: None   Collection Time: 11/02/19  8:11 AM  Result Value Ref Range   Glucose-Capillary 87 70 - 99 mg/dL   Comment 1 Notify RN   Glucose, capillary     Status: None   Collection Time: 11/02/19 10:08 AM  Result Value Ref Range   Glucose-Capillary 85 70 - 99 mg/dL  Glucose, capillary     Status: Abnormal   Collection Time: 11/02/19  5:02 PM  Result Value Ref Range   Glucose-Capillary 146 (H)  70 - 99 mg/dL   Comment 1 Notify RN   Glucose, capillary     Status: None   Collection Time: 11/02/19  7:58 PM  Result Value Ref Range   Glucose-Capillary 95 70 - 99 mg/dL  Glucose, capillary     Status: None   Collection Time: 11/02/19 11:57 PM  Result Value Ref Range   Glucose-Capillary 96 70 - 99 mg/dL  Glucose, capillary     Status: None   Collection Time: 11/03/19  3:54 AM  Result Value Ref Range   Glucose-Capillary 80 70 - 99 mg/dL  Basic metabolic panel     Status: Abnormal   Collection Time: 11/03/19  4:58 AM  Result Value Ref Range   Sodium 138 135 - 145 mmol/L   Potassium 3.4 (L) 3.5 - 5.1 mmol/L   Chloride 107 98 - 111 mmol/L   CO2 25 22 - 32 mmol/L   Glucose, Bld 122 (H) 70 - 99 mg/dL   BUN 8 8 - 23 mg/dL   Creatinine, Ser 0.76 0.44 - 1.00 mg/dL   Calcium 8.4 (L) 8.9 - 10.3 mg/dL   GFR calc non Af Amer >60 >60 mL/min   GFR calc Af Amer >60 >60 mL/min   Anion gap 6 5 - 15    Comment: Performed at [facility], [address] [place], [place] [address]  CBC     Status: Abnormal   Collection Time: 11/03/19  4:58 AM  Result Value Ref Range   WBC 10.9 (H) 4.0 - 10.5 K/uL   RBC 3.25 (L) 3.87 - 5.11 MIL/uL   Hemoglobin 9.4 (L) 12.0 - 15.0 g/dL   HCT 30.0 (L) 36.0 - 46.0 %   MCV 92.3 80.0 - 100.0 fL   MCH 28.9 26.0 - 34.0 pg   MCHC 31.3 30.0 - 36.0 g/dL   RDW 16.8 (H) 11.5 - 15.5 %   Platelets 180 150  - 400 K/uL   nRBC 0.2 0.0 - 0.2 %    Comment: Performed at [facility], [address], [place], [place] [address]  Magnesium     Status: None   Collection Time: 11/03/19  4:58 AM  Result Value Ref Range   Magnesium 2.3 1.7 - 2.4 mg/dL    Comment: Performed at [facility], [address] [place], [place] [address]  Glucose, capillary     Status: None   Collection Time: 11/03/19  7:40 AM  Result Value Ref Range   Glucose-Capillary 74 70 - 99 mg/dL  Glucose, capillary     Status: None   Collection Time: 11/03/19 11:48 AM  Result Value Ref Range   Glucose-Capillary 85 70 - 99 mg/dL  Glucose, capillary     Status: None   Collection Time: 11/03/19  4:08 PM  Result Value Ref Range   Glucose-Capillary 82 70 - 99 mg/dL  Glucose, capillary     Status: None   Collection Time: 11/03/19 11:57 PM  Result Value Ref Range   Glucose-Capillary 92 70 - 99 mg/dL   Comment 1 Notify RN   Basic metabolic panel     Status: Abnormal   Collection Time: 11/04/19  4:09 AM  Result Value Ref Range   Sodium 139 135 - 145 mmol/L   Potassium 3.4 (L) 3.5 - 5.1 mmol/L   Chloride 106 98 - 111 mmol/L   CO2 26 22 - 32 mmol/L   Glucose, Bld 97 70 - 99 mg/dL   BUN 9 8 - 23  mg/dL   Creatinine, Ser 0.76 0.44 - 1.00 mg/dL   Calcium 8.4 (L) 8.9 - 10.3 mg/dL   GFR calc non Af Amer >60 >60 mL/min   GFR calc Af Amer >60 >60 mL/min   Anion gap 7 5 - 15    Comment: Performed at [facility], [address] [place], [place] [address]  CBC     Status: Abnormal   Collection Time: 11/04/19  4:09 AM  Result Value Ref Range   WBC 9.4 4.0 - 10.5 K/uL   RBC 3.32 (L) 3.87 - 5.11 MIL/uL   Hemoglobin 9.6 (L) 12.0 - 15.0 g/dL   HCT 30.1 (L) 36.0 - 46.0 %   MCV 90.7 80.0 - 100.0 fL   MCH 28.9 26.0 - 34.0 pg   MCHC 31.9 30.0 - 36.0 g/dL   RDW 16.8 (H) 11.5 - 15.5 %   Platelets 194 150 - 400 K/uL   nRBC 0.4 (H) 0.0 - 0.2 %    Comment: Performed at [facility], [address]  [place], [place] [address]  Magnesium     Status: None   Collection Time: 11/04/19  4:09 AM  Result Value Ref Range   Magnesium 2.2 1.7 - 2.4 mg/dL    Comment: Performed at [facility], [address] [place], [place] [address]  Glucose, capillary     Status: None   Collection Time: 11/04/19  4:35 AM  Result Value Ref Range   Glucose-Capillary 84 70 - 99 mg/dL   Comment 1 Notify RN   Glucose, capillary     Status: Abnormal   Collection Time: 11/04/19  7:27 AM  Result Value Ref Range   Glucose-Capillary 68 (L) 70 - 99 mg/dL  Glucose, capillary     Status: Abnormal   Collection Time: 11/04/19 11:39 AM  Result Value Ref Range   Glucose-Capillary 109 (H) 70 - 99 mg/dL  Glucose, capillary     Status: None   Collection Time: 11/04/19  4:30 PM  Result Value Ref Range   Glucose-Capillary 77 70 - 99 mg/dL  Glucose, capillary     Status: None   Collection Time: 11/04/19  7:57 PM  Result Value Ref Range   Glucose-Capillary 80 70 - 99 mg/dL  Glucose, capillary     Status: None   Collection Time: 11/05/19 12:28 AM  Result Value Ref Range   Glucose-Capillary 84 70 - 99 mg/dL  Glucose, capillary     Status: None   Collection Time: 11/05/19  5:55 AM  Result Value Ref Range   Glucose-Capillary 99 70 - 99 mg/dL  Glucose, capillary     Status: None   Collection Time: 11/05/19  7:27 AM  Result Value Ref Range   Glucose-Capillary 96 70 - 99 mg/dL  Respiratory Panel by RT PCR (Flu A&B, Covid) - Nasopharyngeal Swab     Status: None   Collection Time: 11/05/19 11:07 AM   Specimen: Nasopharyngeal Swab  Result Value Ref Range   SARS Coronavirus 2 by RT PCR NEGATIVE NEGATIVE    Comment: (NOTE) SARS-CoV-2 target nucleic acids are NOT DETECTED. The SARS-CoV-2 RNA is generally detectable in upper respiratoy specimens during the acute phase of infection. The lowest concentration of SARS-CoV-2 viral copies this assay can detect is 131 copies/mL. A negative result does not preclude  SARS-Cov-2 infection and should not be used as the sole basis for treatment or other patient management decisions. A negative result may occur with  improper specimen collection/handling, submission  of specimen other than nasopharyngeal swab, presence of viral mutation(s) within the areas targeted by this assay, and inadequate number of viral copies (<131 copies/mL). A negative result must be combined with clinical observations, patient history, and epidemiological information. The expected result is Negative. Fact Sheet for Patients:  PinkCheek.be Fact Sheet for Healthcare Providers:  GravelBags.it This test is not yet ap proved or cleared by the [place] FDA and  has been authorized for detection and/or diagnosis of SARS-CoV-2 by FDA under an Emergency Use Authorization (EUA). This EUA will remain  in effect (meaning this test can be used) for the duration of the COVID-19 declaration under Section 564(b)(1) of the Act, 21 U.S.C. section 360bbb-3(b)(1), unless the authorization is terminated or revoked sooner.    Influenza A by PCR NEGATIVE NEGATIVE   Influenza B by PCR NEGATIVE NEGATIVE    Comment: (NOTE) The Xpert Xpress SARS-CoV-2/FLU/RSV assay is intended as an aid in  the diagnosis of influenza from Nasopharyngeal swab specimens and  should not be used as a sole basis for treatment. Nasal washings and  aspirates are unacceptable for Xpert Xpress SARS-CoV-2/FLU/RSV  testing. Fact Sheet for Patients: PinkCheek.be Fact Sheet for Healthcare Providers: GravelBags.it This test is not yet approved or cleared by the [place] FDA and  has been authorized for detection and/or diagnosis of SARS-CoV-2 by  FDA under an Emergency Use Authorization (EUA). This EUA will remain  in effect (meaning this test can be used) for the duration of the  Covid-19 declaration  under Section 564(b)(1) of the Act, 21  U.S.C. section 360bbb-3(b)(1), unless the authorization is  terminated or revoked. Performed at [facility], [address] [place], [place] [address]   Glucose, capillary     Status: None   Collection Time: 11/05/19 11:31 AM  Result Value Ref Range   Glucose-Capillary 88 70 - 99 mg/dL  Glucose, capillary     Status: None   Collection Time: 11/05/19  4:17 PM  Result Value Ref Range   Glucose-Capillary 85 70 - 99 mg/dL  Glucose, capillary     Status: None   Collection Time: 11/05/19  7:51 PM  Result Value Ref Range   Glucose-Capillary 86 70 - 99 mg/dL  Glucose, capillary     Status: None   Collection Time: 11/06/19 12:24 AM  Result Value Ref Range   Glucose-Capillary 84 70 - 99 mg/dL  Glucose, capillary     Status: None   Collection Time: 11/06/19  3:52 AM  Result Value Ref Range   Glucose-Capillary 86 70 - 99 mg/dL  Glucose, capillary     Status: None   Collection Time: 11/06/19  7:49 AM  Result Value Ref Range   Glucose-Capillary 97 70 - 99 mg/dL  Glucose, capillary     Status: None   Collection Time: 11/06/19 12:12 PM  Result Value Ref Range   Glucose-Capillary 98 70 - 99 mg/dL  Glucose, capillary     Status: None   Collection Time: 11/06/19  4:25 PM  Result Value Ref Range   Glucose-Capillary 90 70 - 99 mg/dL  Glucose, capillary     Status: None   Collection Time: 11/07/19  6:39 AM  Result Value Ref Range   Glucose-Capillary 75 70 - 99 mg/dL  Glucose, capillary     Status: None   Collection Time: 11/07/19 11:45 AM  Result Value Ref Range   Glucose-Capillary 78 70 - 99 mg/dL  Glucose, capillary     Status: None  Collection Time: 11/07/19  5:23 PM  Result Value Ref Range   Glucose-Capillary 71 70 - 99 mg/dL      PHQ2/9: Depression screen [facility] 2/9 01/27/2020 09/23/2019 08/23/2019 05/12/2019 11/25/2018  Decreased Interest 0 0 0 0 0  Down, Depressed, Hopeless 0 0 0 0 0  PHQ - 2 Score 0 0 0 0 0  Altered sleeping 0 0  0 0 0  Tired, decreased energy 0 0 0 0 0  Change in appetite 0 0 0 0 0  Feeling bad or failure about yourself  0 0 0 0 0  Trouble concentrating 0 0 0 0 0  Moving slowly or fidgety/restless 0 0 0 0 0  Suicidal thoughts 0 0 0 0 -  PHQ-9 Score 0 0 0 0 0  Difficult doing work/chores - - - Not difficult at all -    phq 9 is negative   Fall Risk: Fall Risk  01/27/2020 09/23/2019 08/23/2019 05/12/2019 11/25/2018  Falls in the past year? 0 1 0 0 1  Number falls in past yr: 0 0 0 0 1  Comment - - - - -  Injury with Fall? 0 1 0 0 0  Comment - 07/2019 hematoma - - -  Follow up - - - Falls evaluation completed -     Functional Status Survey: Is the patient deaf or have difficulty hearing?: No Does the patient have difficulty seeing, even when wearing glasses/contacts?: No Does the patient have difficulty concentrating, remembering, or making decisions?: No Does the patient have difficulty walking or climbing stairs?: No Does the patient have difficulty dressing or bathing?: No Does the patient have difficulty doing errands alone such as visiting a doctor's office or shopping?: No    Assessment & Plan  1. Type 2 diabetes mellitus with diabetic neuropathy, without long-term current use of insulin (HCC)  - POCT HgB A1C - Urine Microalbumin w/creat. ratio - blood glucose meter kit and supplies; Dispense based on patient and insurance preference. Use up to four times daily as directed. (FOR ICD-10 E10.9, E11.9).  Dispense: 1 each; Refill: 0  2. Dyslipidemia  - Lipid panel - atorvastatin (LIPITOR) 40 MG tablet; Take 1 tablet (40 mg total) by mouth every evening.  Dispense: 90 tablet; Refill: 1  3. Benign essential HTN  - COMPLETE METABOLIC PANEL WITH GFR  4. Obstructive apnea    5. Stage 3 chronic kidney disease, unspecified whether stage 3a or 3b CKD   6. Pulmonary hypertension ([place])  Found on 08/08/2019 Echo   7. Iron deficiency anemia, unspecified iron deficiency anemia  type    8. Leg edema, right  Per patient thigh edema seems to be improving   9. Morbid obesity, unspecified obesity type ([place])  Significant weight gain since unable to walk   10. Primary osteoarthritis involving multiple joints   11. Chronic infection of right knee (HCC)  Under the care of ID   12. B12 deficiency  - Vitamin B12  13. Paresthesia of left leg  Keep follow up with neurologist and ortho   14. Perennial allergic rhinitis  - fluticasone (FLONASE) 50 MCG/ACT nasal spray; Place 2 sprays into both nostrils daily.  Dispense: 16 g; Refill: 2  15. Deep vein thrombosis (DVT) of popliteal vein of right lower extremity, unspecified chronicity (HCC)  Has IV filter, keep follow up with vascular surgeon  - US Venous Img Lower Unilateral Left (DVT); Future

## 2020-01-28 LAB — COMPLETE METABOLIC PANEL WITH GFR
AG Ratio: 1 (calc) (ref 1.0–2.5)
ALT: 10 U/L (ref 6–29)
AST: 15 U/L (ref 10–35)
Albumin: 4 g/dL (ref 3.6–5.1)
Alkaline phosphatase (APISO): 112 U/L (ref 37–153)
BUN/Creatinine Ratio: 14 (calc) (ref 6–22)
BUN: 15 mg/dL (ref 7–25)
CO2: 31 mmol/L (ref 20–32)
Calcium: 9.6 mg/dL (ref 8.6–10.4)
Chloride: 104 mmol/L (ref 98–110)
Creat: 1.06 mg/dL — ABNORMAL HIGH (ref 0.60–0.93)
GFR, Est African American: 60 mL/min/{1.73_m2} (ref >=60)
GFR, Est Non African American: 52 mL/min/{1.73_m2} — ABNORMAL LOW (ref >=60)
Globulin: 3.9 g/dL (calc) — ABNORMAL HIGH (ref 1.9–3.7)
Glucose, Bld: 91 mg/dL (ref 65–99)
Potassium: 4.9 mmol/L (ref 3.5–5.3)
Sodium: 141 mmol/L (ref 135–146)
Total Bilirubin: 0.4 mg/dL (ref 0.2–1.2)
Total Protein: 7.9 g/dL (ref 6.1–8.1)

## 2020-01-28 LAB — LIPID PANEL
Cholesterol: 152 mg/dL (ref <200)
HDL: 69 mg/dL (ref >=50)
LDL Cholesterol (Calc): 64 mg/dL (calc)
Non-HDL Cholesterol (Calc): 83 mg/dL (calc) (ref <130)
Total CHOL/HDL Ratio: 2.2 (calc) (ref <5.0)
Triglycerides: 103 mg/dL (ref <150)

## 2020-01-28 LAB — VITAMIN B12: Vitamin B-12: 573 pg/mL (ref 200–1100)

## 2020-01-29 ENCOUNTER — Encounter: Payer: Self-pay | Admitting: Family Medicine

## 2020-01-31 LAB — MICROALBUMIN / CREATININE URINE RATIO
Creatinine, Urine: 151 mg/dL (ref 20–275)
Microalb Creat Ratio: 3 mcg/mg creat (ref <30)
Microalb, Ur: 0.4 mg/dL

## 2020-02-01 ENCOUNTER — Telehealth: Payer: Self-pay

## 2020-02-01 NOTE — Telephone Encounter (Signed)
Crystal,  For whatever reason, this did not populate in my box so I do apologize but I got her scheduled for this Friday (02/03/20) @ 2:30pm [place] entrance.

## 2020-02-01 NOTE — Telephone Encounter (Signed)
Becky Trevino would like to know when her U/S appointment will be scheduled. She said she has not heard anything about it.

## 2020-02-01 NOTE — Telephone Encounter (Signed)
Patient called.  Patient aware.  

## 2020-02-03 ENCOUNTER — Other Ambulatory Visit: Payer: Self-pay

## 2020-02-03 ENCOUNTER — Other Ambulatory Visit: Payer: Self-pay | Admitting: Family Medicine

## 2020-02-03 ENCOUNTER — Ambulatory Visit: Admission: RE | Admit: 2020-02-03 | Payer: BC Managed Care – PPO | Source: Ambulatory Visit

## 2020-02-03 ENCOUNTER — Telehealth: Payer: Self-pay | Admitting: Family Medicine

## 2020-02-03 ENCOUNTER — Ambulatory Visit
Admission: RE | Admit: 2020-02-03 | Discharge: 2020-02-03 | Disposition: A | Discharge status: Home / Self Care | Payer: BC Managed Care – PPO | Source: Ambulatory Visit | Attending: Family Medicine | Admitting: Family Medicine

## 2020-02-03 DIAGNOSIS — M7989 Other specified soft tissue disorders: Secondary | ICD-10-CM | POA: Insufficient documentation

## 2020-02-03 DIAGNOSIS — M79661 Pain in right lower leg: Secondary | ICD-10-CM

## 2020-02-03 DIAGNOSIS — I82431 Acute embolism and thrombosis of right popliteal vein: Secondary | ICD-10-CM

## 2020-02-03 DIAGNOSIS — I825Z1 Chronic embolism and thrombosis of unspecified deep veins of right distal lower extremity: Secondary | ICD-10-CM

## 2020-02-03 NOTE — Telephone Encounter (Signed)
Copied from [place] [id number]. Topic: General - Call Back - No Documentation >> Feb 03, 2020  2:00 PM Jaynie Collins D wrote: Reason for CRM: Maudie Mercury from the Ultrasound Dept at [facility] called and wanted to discuss ultrasound results with PCP. Ultrasound did reveal some findings and patient is currently waiting in the office, so Maudie Mercury would need someone to call her back today.  [phone] contact number

## 2020-02-09 ENCOUNTER — Encounter: Payer: Self-pay | Admitting: Oncology

## 2020-02-09 NOTE — Progress Notes (Signed)
Called patient to introduce the [facility].    New patient evaluation for DVT of right leg.  Does have pain and swelling of right leg that is 3/10 on pain scale.

## 2020-02-10 ENCOUNTER — Inpatient Hospital Stay: Payer: BC Managed Care – PPO

## 2020-02-10 ENCOUNTER — Other Ambulatory Visit: Payer: Self-pay

## 2020-02-10 ENCOUNTER — Inpatient Hospital Stay: Payer: BC Managed Care – PPO | Attending: Oncology | Admitting: Oncology

## 2020-02-10 VITALS — BP 136/80 | HR 69 | Temp 95.8°F | Resp 18

## 2020-02-10 DIAGNOSIS — Z87898 Personal history of other specified conditions: Secondary | ICD-10-CM | POA: Diagnosis not present

## 2020-02-10 DIAGNOSIS — I82411 Acute embolism and thrombosis of right femoral vein: Secondary | ICD-10-CM | POA: Diagnosis not present

## 2020-02-10 LAB — CBC WITH DIFFERENTIAL/PLATELET
Abs Immature Granulocytes: 0.05 10*3/uL (ref 0.00–0.07)
Basophils Absolute: 0 10*3/uL (ref 0.0–0.1)
Basophils Relative: 1 %
Eosinophils Absolute: 0.2 10*3/uL (ref 0.0–0.5)
Eosinophils Relative: 3 %
HCT: 35.7 % — ABNORMAL LOW (ref 36.0–46.0)
Hemoglobin: 11.1 g/dL — ABNORMAL LOW (ref 12.0–15.0)
Immature Granulocytes: 1 %
Lymphocytes Relative: 21 %
Lymphs Abs: 1.3 10*3/uL (ref 0.7–4.0)
MCH: 28.3 pg (ref 26.0–34.0)
MCHC: 31.1 g/dL (ref 30.0–36.0)
MCV: 91.1 fL (ref 80.0–100.0)
Monocytes Absolute: 0.4 10*3/uL (ref 0.1–1.0)
Monocytes Relative: 7 %
Neutro Abs: 4.2 10*3/uL (ref 1.7–7.7)
Neutrophils Relative %: 67 %
Platelets: 128 10*3/uL — ABNORMAL LOW (ref 150–400)
RBC: 3.92 MIL/uL (ref 3.87–5.11)
RDW: 17.9 % — ABNORMAL HIGH (ref 11.5–15.5)
WBC: 6.1 10*3/uL (ref 4.0–10.5)
nRBC: 0 % (ref 0.0–0.2)

## 2020-02-10 LAB — RETIC PANEL
Immature Retic Fract: 16.6 % — ABNORMAL HIGH (ref 2.3–15.9)
RBC.: 3.9 MIL/uL (ref 3.87–5.11)
Retic Count, Absolute: 60.5 10*3/uL (ref 19.0–186.0)
Retic Ct Pct: 1.6 % (ref 0.4–3.1)
Reticulocyte Hemoglobin: 31.2 pg (ref >27.9)

## 2020-02-10 MED ORDER — APIXABAN 2.5 MG PO TABS
2.5000 mg | ORAL_TABLET | Freq: Two times a day (BID) | ORAL | 1 refills | Status: DC
Start: 2020-02-10 — End: 2020-04-04

## 2020-02-10 NOTE — Progress Notes (Signed)
Hematology/Oncology Follow Up Note [facility]  [phone] [phone]  Patient Care Team: [name], MD as PCP - General (Family Medicine) Yolonda Kida, MD as Consulting Physician (Cardiology) Albertine Patricia, DPM as Consulting Physician (Podiatry) Lorelee Cover., MD as Consulting Physician (Ophthalmology) Hessie Knows, MD as Consulting Physician (Orthopedic Surgery) Murrell Redden, MD as Consulting Physician (Urology) Cira Servant, DO as Consulting Physician (Rehabilitation) [name], MD as Consulting Physician (Infectious Diseases)   Name of the patient: Becky Trevino  [id number]  [birth year]   REASON FOR VISIT  posthospitalization follow-up for DVT  INTERVAL HISTORY 74 y.o. female with past medical history listed as below presents for follow-up of DVT. She was admitted from 10/27/2019-11/07/2019 for right lower extremity cellulitis/wound infection, bacteremia with group B streptococcus, right lower extremity DVT. # Patient had ankle fracture status post a recent right ankle ORIF on 09/02/2019 by Dr. Araceli Bouche.  Patient has a wound VAC She was noticed to have right lower extremity swelling, mild erythema and warmth with associated tenderness. 11/24/2019 US venous right lower extremity showed a DVT within the right popliteal vein, with superficial thrombus seen at the saphenofemoral junction. Patient tells me that she was diagnosed with "blood clots" in August 2020.  At that time she had pain and swelling for about a week and went to ER for evaluation.    04/29/2019 ultrasound was negative for DVT, only showed a superficial thrombophlebitis involving a branch of the great saphenous vein in the proximal thigh.  She was fine to have right Baker's cyst.  Patient denies any immobilization factors contributing to this event.  Patient had elevated D-dimer at that time. Patient was started on Xarelto-starter kit by emergency  room.  Patient was recommended to have US aorta iliac Doppler by primary care provider Dr. Ancil Boozer and I do not see the study was done.  Xarelto was continued outpatient for thrombophlebitis..  Patient was hospitalized again at the end of November 2020 due to syncope episodes which likely due to orthostatic hypotension.  Patient was noted to have anemia, CT abdomen pelvis to evaluate after trauma, showed left gluteal hematoma without signs of extravasation.  Xarelto was held during the hospitalization.  Saw cardiology [place].  Patient was seen by Dr. Ancil Boozer on 08/23/2019 and the Xarelto was stopped.  09/02/2019 patient slipped and obtained right ankle fracture.  Status post ORIF procedure by Dr. Luana Shu.  Postop patient was recommended to start on Lovenox 40 mg daily injections prophylactically until her presentation to emergency room in February.  At that point, she was found to have a provoked right lower extremity DVT and she was seen by me during that admission.  Patient was recommended to start therapeutic Lovenox.  During the same hospitalization, patient developed spontaneous iliopsoas hematomaand therapeutic Lovenox was discontinued. Patient had IVC filter placed on 11/02/2019.  Patient was recommended to have outpatient follow-up with me. Patient was recently seen by Dr. Ancil Boozer and was found to have worsening of right lower extremity swelling, Doppler ultrasound was obtained on 02/03/2020 which was positive for DVT in the right lower extremity, increased clot burden in the right lower extremity since 10/27/2019.  Thrombus extending from the right common femoral vein to the right calf.  Patient was referred back to me for evaluation and management. Today patient was accompanied by her caregiver.  She reports right lower extremity/thigh swelling.  Left thigh numbness since IVC placement.  Review of Systems  Constitutional: Positive for fatigue. Negative for  appetite change, chills and fever.  HENT:    Negative for hearing loss and voice change.   Eyes: Negative for eye problems.  Respiratory: Negative for chest tightness and cough.   Cardiovascular: Positive for leg swelling. Negative for chest pain.  Gastrointestinal: Negative for abdominal distention, abdominal pain and blood in stool.  Endocrine: Negative for hot flashes.  Genitourinary: Negative for difficulty urinating and frequency.   Musculoskeletal: Negative for arthralgias.  Skin: Negative for itching and rash.  Neurological: Negative for extremity weakness.  Hematological: Negative for adenopathy.  Psychiatric/Behavioral: Negative for confusion.      Allergies  Allergen Reactions  . Lisinopril Swelling     Past Medical History:  Diagnosis Date  . Cervical spondylosis   . Chronic kidney disease    Stage 3  . DDD (degenerative disc disease), cervical    [facility]  . Diabetes mellitus without complication ([place])   . Diverticulosis   . Hyperlipidemia   . Hypertension   . Intertrigo   . Leg cramps   . Leg varices   . Obesity   . OSA (obstructive sleep apnea)   . Symptomatic menopausal or female climacteric states   . Syncope      Past Surgical History:  Procedure Laterality Date  . ABDOMINAL HYSTERECTOMY  1993   Total  . BUNIONECTOMY Bilateral 1993  . I & D KNEE WITH POLY EXCHANGE Right 11/19/2017   Procedure: RIGHT KNEE POLY EXCHANGE WITH IRRIGATION AND DEBRIDEMENT;  Surgeon: Hessie Knows, MD;  Location: [facility] ORS;  Service: Orthopedics;  Laterality: Right;  . JOINT REPLACEMENT     bilateral knee  . LAMINECTOMY  11/14/2013    Cervical Fusion , [facility], Dr. Delilah Shan  . LASER ABLATION Bilateral 07/29/2012   Dr. Lucky Cowboy  . LOWER EXTREMITY ANGIOGRAPHY Right 11/02/2019   Procedure: Lower Extremity Angiography;  Surgeon: Katha Cabal, MD;  Location: [facility];  Service: Cardiovascular;  Laterality: Right;  . ORIF ANKLE FRACTURE Right 09/02/2019   Procedure: OPEN REDUCTION INTERNAL FIXATION  (ORIF) ANKLE FRACTURE BIMALLEOLAR;  Surgeon: [name], DPM;  Location: [facility] ORS;  Service: Podiatry;  Laterality: Right;  . REPLACEMENT TOTAL KNEE Left 03/2010   Dr. Rudene Christians  . TOTAL HIP ARTHROPLASTY Left 12/11/2015   Procedure: TOTAL HIP ARTHROPLASTY ANTERIOR APPROACH;  Surgeon: Hessie Knows, MD;  Location: [facility] ORS;  Service: Orthopedics;  Laterality: Left;  . TOTAL KNEE ARTHROPLASTY Right 03/11/2016   Procedure: TOTAL KNEE ARTHROPLASTY;  Surgeon: Hessie Knows, MD;  Location: [facility] ORS;  Service: Orthopedics;  Laterality: Right;    Social History   Socioeconomic History  . Marital status: Married    Spouse name: Not on file  . Number of children: 1  . Years of education: Not on file  . Highest education level: Not on file  Occupational History  . Not on file  Tobacco Use  . Smoking status: Never Smoker  . Smokeless tobacco: Never Used  Substance and Sexual Activity  . Alcohol use: No    Alcohol/week: 0.0 standard drinks  . Drug use: No  . Sexual activity: Yes    Partners: Male  Other Topics Concern  . Not on file  Social History Narrative   Married, one adopted grown child    Social Determinants of Health   Financial Resource Strain: Low Risk   . Difficulty of Paying Living Expenses: Not hard at all  Food Insecurity: No Food Insecurity  . Worried About Charity fundraiser in the Last Year: Never  true  . Ran Out of Food in the Last Year: Never true  Transportation Needs: No Transportation Needs  . Lack of Transportation (Medical): No  . Lack of Transportation (Non-Medical): No  Physical Activity: Insufficiently Active  . Days of Exercise per Week: 3 days  . Minutes of Exercise per Session: 30 min  Stress: No Stress Concern Present  . Feeling of Stress : Not at all  Social Connections: Slightly Isolated  . Frequency of Communication with Friends and Family: More than three times a week  . Frequency of Social Gatherings with Friends and Family: More than three times a  week  . Attends Religious Services: More than 4 times per year  . Active Member of Clubs or Organizations: No  . Attends Archivist Meetings: Never  . Marital Status: Married  Human resources officer Violence: Not At Risk  . Fear of Current or Ex-Partner: No  . Emotionally Abused: No  . Physically Abused: No  . Sexually Abused: No    Family History  Problem Relation Age of Onset  . Diabetes Mother   . Hyperlipidemia Mother   . Hypertension Mother   . Diabetes Father   . Hyperlipidemia Father   . Hypertension Father   . Obesity Father   . Hypertension Sister   . Hyperlipidemia Sister   . Hyperlipidemia Sister   . Breast cancer Neg Hx      Current Outpatient Medications:  .  acetaminophen (TYLENOL) 650 MG CR tablet, Take 650-1,300 mg by mouth 2 (two) times daily as needed for pain., Disp: , Rfl:  .  atorvastatin (LIPITOR) 40 MG tablet, Take 1 tablet (40 mg total) by mouth every evening., Disp: 90 tablet, Rfl: 1 .  blood glucose meter kit and supplies, Dispense based on patient and insurance preference. Use up to four times daily as directed. (FOR ICD-10 E10.9, E11.9)., Disp: 1 each, Rfl: 0 .  Calcium Carbonate-Vitamin D (CALCIUM 600-D) 600-400 MG-UNIT per tablet, Take 1 tablet by mouth 2 (two) times daily., Disp: , Rfl:  .  doxycycline (VIBRAMYCIN) 100 MG capsule, Take 100 mg by mouth 2 (two) times daily., Disp: , Rfl:  .  fluticasone (FLONASE) 50 MCG/ACT nasal spray, Place 2 sprays into both nostrils daily., Disp: 16 g, Rfl: 2 .  furosemide (LASIX) 20 MG tablet, Take 20 mg by mouth every evening., Disp: , Rfl:  .  glucose blood (ONE TOUCH ULTRA TEST) test strip, Use as instructed, Disp: 100 each, Rfl: 12 .  HYDROcodone-acetaminophen (NORCO/VICODIN) 5-325 MG tablet, Take 1 tablet by mouth every 6 (six) hours as needed for moderate pain or severe pain., Disp: 20 tablet, Rfl: 0 .  imipramine (TOFRANIL) 25 MG tablet, Take 1 tablet (25 mg total) by mouth at bedtime., Disp: 90  tablet, Rfl: 3 .  ketoconazole (NIZORAL) 2 % cream, Apply 1 application topically 2 (two) times daily as needed (rash). , Disp: , Rfl:  .  Magnesium Oxide 500 MG CAPS, Take 500 mg by mouth daily. , Disp: , Rfl:  .  metoprolol succinate (TOPROL-XL) 100 MG 24 hr tablet, TAKE 1 TABLET (100 MG TOTAL) BY MOUTH EVERY EVENING. TAKE WITH OR IMMEDIATELY FOLLOWING A MEAL., Disp: 90 tablet, Rfl: 1 .  midodrine (PROAMATINE) 5 MG tablet, Take 1 tablet (5 mg total) by mouth 3 (three) times daily with meals. (Patient taking differently: Take 5 mg by mouth 3 (three) times daily. ), Disp: 90 tablet, Rfl: 0 .  mirabegron ER (MYRBETRIQ) 25 MG TB24 tablet, Take  1 tablet (25 mg total) by mouth daily., Disp: 90 tablet, Rfl: 3 .  Netarsudil-Latanoprost (ROCKLATAN) 0.02-0.005 % SOLN, Place 1 drop into both eyes at bedtime., Disp: , Rfl:  .  potassium chloride (KLOR-CON) 10 MEQ tablet, Take 10 mEq by mouth daily., Disp: , Rfl:  .  SIMBRINZA 1-0.2 % SUSP, Place 1 drop into both eyes 2 (two) times daily., Disp: , Rfl:  .  apixaban (ELIQUIS) 2.5 MG TABS tablet, Take 1 tablet (2.5 mg total) by mouth 2 (two) times daily., Disp: 60 tablet, Rfl: 1  Physical exam:  Vitals:   02/10/20 1107  BP: 136/80  Pulse: 69  Resp: 18  Temp: (!) 95.8 F (35.4 C)   Physical Exam Constitutional:      General: She is not in acute distress.    Appearance: She is obese.     Comments: Patient states in the wheelchair  HENT:     Head: Normocephalic and atraumatic.  Eyes:     General: No scleral icterus. Cardiovascular:     Rate and Rhythm: Normal rate and regular rhythm.     Heart sounds: Normal heart sounds.  Pulmonary:     Effort: Pulmonary effort is normal. No respiratory distress.     Breath sounds: No wheezing.  Abdominal:     General: Bowel sounds are normal. There is no distension.     Palpations: Abdomen is soft.  Musculoskeletal:        General: Swelling present. No deformity. Normal range of motion.     Cervical back:  Normal range of motion and neck supple.  Skin:    General: Skin is warm and dry.     Findings: No erythema or rash.  Neurological:     Mental Status: She is alert and oriented to person, place, and time. Mental status is at baseline.     Cranial Nerves: No cranial nerve deficit.     Coordination: Coordination normal.  Psychiatric:        Mood and Affect: Mood normal.     CMP Latest Ref Rng & Units 01/27/2020  Glucose 65 - 99 mg/dL 91  BUN 7 - 25 mg/dL 15  Creatinine 0.60 - 0.93 mg/dL 1.06(H)  Sodium 135 - 146 mmol/L 141  Potassium 3.5 - 5.3 mmol/L 4.9  Chloride 98 - 110 mmol/L 104  CO2 20 - 32 mmol/L 31  Calcium 8.6 - 10.4 mg/dL 9.6  Total Protein 6.1 - 8.1 g/dL 7.9  Total Bilirubin 0.2 - 1.2 mg/dL 0.4  Alkaline Phos 38 - 126 U/L -  AST 10 - 35 U/L 15  ALT 6 - 29 U/L 10   CBC Latest Ref Rng & Units 02/10/2020  WBC 4.0 - 10.5 K/uL 6.1  Hemoglobin 12.0 - 15.0 g/dL 11.1(L)  Hematocrit 36.0 - 46.0 % 35.7(L)  Platelets 150 - 400 K/uL 128(L)    RADIOGRAPHIC STUDIES: I have personally reviewed the radiological images as listed and agreed with the findings in the report. US Venous Img Lower Unilateral Right (DVT)  Result Date: 02/03/2020 CLINICAL DATA:  Pain and swelling in right lower leg. History of DVT. EXAM: RIGHT LOWER EXTREMITY VENOUS DOPPLER ULTRASOUND TECHNIQUE: Gray-scale sonography with graded compression, as well as color Doppler and duplex ultrasound were performed to evaluate the lower extremity deep venous systems from the level of the common femoral vein and including the common femoral, femoral, profunda femoral, popliteal and calf veins including the posterior tibial, peroneal and gastrocnemius veins when visible. The superficial great saphenous  vein was also interrogated. Spectral Doppler was utilized to evaluate flow at rest and with distal augmentation maneuvers in the common femoral, femoral and popliteal veins. COMPARISON:  10/27/2019 FINDINGS: Contralateral Common  Femoral Vein: No evidence of thrombus. Normal compressibility and color Doppler flow. Common Femoral Vein: Positive for thrombus. Nonocclusive thrombus in the right common femoral vein. This is new from the previous examination. Saphenofemoral Junction: Positive for thrombus at the saphenofemoral junction. Profunda Femoral Vein: Positive for thrombus. Incomplete compressibility. Nonocclusive thrombus based on the color Doppler images. Femoral Vein: Positive for thrombus. Femoral vein thrombus appears to be occlusive without significant flow. Limited evaluation of the distal femoral vein. Popliteal Vein: Positive for thrombus. Nonocclusive thrombus in the popliteal vein. Calf Veins: Positive for thrombus in the visualized deep calf veins. Visualized posterior tibial veins are not compressible. No significant color Doppler flow in the right peroneal veins. Other Findings:  None. IMPRESSION: Positive for deep venous thrombosis in the right lower extremity. Increased clot burden in the right lower extremity since 10/27/2019. Thrombus extending from the right common femoral vein to the right calf. Electronically Signed   By: Markus Daft M.D.   On: 02/03/2020 13:54     Assessment and plan  1. Acute deep vein thrombosis (DVT) of femoral vein of right lower extremity (HCC)   2. History of pelvic hematoma    Ultrasound right lower extremity was reviewed and discussed with patient. Patient has progression of right lower extremity DVT, now extending to proximal veins.   Previously therapeutic Lovenox was discontinued due to spontaneous development of hematoma.   I recommend patient to be started on Eliquis 2.5 mg twice daily.  I explained to patient that anticoagulation may increase her risk of bleeding another episode of hematoma.  Patient is willing to take the risk.  Hopefully the prophylactic dose of Eliquis will help to reduce her symptoms without significantly increase her risk of bleeding..  She has IVC  filter and therefore lower risk of lower extremity DVT to progress to pulmonary embolism. I recommend patient to call vascular surgeon's office and get an appointment ASAP for evaluation of thromboectomy.  She voices understanding. Check hypergraphia work-up with antiphospholipid syndrome antibodies, prothrombin gene mutation, factor V Leiden mutation. Orders Placed This Encounter  Procedures  . CBC with Differential/Platelet    Standing Status:   Future    Number of Occurrences:   1    Standing Expiration Date:   02/09/2021  . Factor 5 leiden    Standing Status:   Future    Number of Occurrences:   1    Standing Expiration Date:   02/09/2021  . Prothrombin gene mutation    Standing Status:   Future    Number of Occurrences:   1    Standing Expiration Date:   02/09/2021  . ANTIPHOSPHOLIPID SYNDROME PROF    Standing Status:   Future    Number of Occurrences:   1    Standing Expiration Date:   02/09/2021  . Retic Panel    Standing Status:   Future    Number of Occurrences:   1    Standing Expiration Date:   02/09/2021  . CBC with Differential/Platelet    Standing Status:   Future    Standing Expiration Date:   02/09/2021  . Comprehensive metabolic panel    Standing Status:   Future    Standing Expiration Date:   02/09/2021    Follow-up in 8 weeks. We spent sufficient time to  discuss many aspect of care, questions were answered to patient's satisfaction. A total of 40 minutes was spent on this visit.  With 10 minutes spent reviewing image findings, pathology reports, 25 minutes counseling the patient on the rationale and potential side effects of anticoagulation.  Additional 5 minutes was spent on answering patient's questions.    [name], MD, PhD Hematology Oncology [facility] Pager- [phone] 02/10/2020

## 2020-02-12 LAB — ANTIPHOSPHOLIPID SYNDROME PROF
Anticardiolipin IgG: <9 GPL U/mL (ref 0–14)
Anticardiolipin IgM: <9 MPL U/mL (ref 0–12)
DRVVT: 34 s (ref 0.0–47.0)
PTT Lupus Anticoagulant: 30.4 s (ref 0.0–51.9)

## 2020-02-14 LAB — FACTOR 5 LEIDEN

## 2020-02-15 LAB — PROTHROMBIN GENE MUTATION

## 2020-02-20 DIAGNOSIS — G5722 Lesion of femoral nerve, left lower limb: Secondary | ICD-10-CM | POA: Diagnosis not present

## 2020-02-20 DIAGNOSIS — L03115 Cellulitis of right lower limb: Secondary | ICD-10-CM | POA: Diagnosis not present

## 2020-02-22 DIAGNOSIS — B351 Tinea unguium: Secondary | ICD-10-CM | POA: Diagnosis not present

## 2020-02-22 DIAGNOSIS — E1142 Type 2 diabetes mellitus with diabetic polyneuropathy: Secondary | ICD-10-CM | POA: Diagnosis not present

## 2020-02-24 DIAGNOSIS — M25461 Effusion, right knee: Secondary | ICD-10-CM | POA: Diagnosis not present

## 2020-02-24 DIAGNOSIS — M1611 Unilateral primary osteoarthritis, right hip: Secondary | ICD-10-CM | POA: Diagnosis not present

## 2020-02-24 DIAGNOSIS — Z96651 Presence of right artificial knee joint: Secondary | ICD-10-CM | POA: Diagnosis not present

## 2020-02-24 DIAGNOSIS — M25561 Pain in right knee: Secondary | ICD-10-CM | POA: Diagnosis not present

## 2020-02-25 DIAGNOSIS — R2681 Unsteadiness on feet: Secondary | ICD-10-CM | POA: Diagnosis not present

## 2020-02-25 DIAGNOSIS — L03115 Cellulitis of right lower limb: Secondary | ICD-10-CM | POA: Diagnosis not present

## 2020-02-25 DIAGNOSIS — E1140 Type 2 diabetes mellitus with diabetic neuropathy, unspecified: Secondary | ICD-10-CM | POA: Diagnosis not present

## 2020-02-29 ENCOUNTER — Other Ambulatory Visit: Payer: Self-pay

## 2020-02-29 ENCOUNTER — Encounter (INDEPENDENT_AMBULATORY_CARE_PROVIDER_SITE_OTHER): Payer: Self-pay | Admitting: Nurse Practitioner

## 2020-02-29 ENCOUNTER — Other Ambulatory Visit
Admission: RE | Admit: 2020-02-29 | Discharge: 2020-02-29 | Disposition: A | Discharge status: Home / Self Care | Payer: BC Managed Care – PPO | Source: Ambulatory Visit | Attending: Vascular Surgery | Admitting: Vascular Surgery

## 2020-02-29 ENCOUNTER — Telehealth (INDEPENDENT_AMBULATORY_CARE_PROVIDER_SITE_OTHER): Payer: Self-pay

## 2020-02-29 ENCOUNTER — Ambulatory Visit (INDEPENDENT_AMBULATORY_CARE_PROVIDER_SITE_OTHER): Payer: BC Managed Care – PPO

## 2020-02-29 ENCOUNTER — Ambulatory Visit (INDEPENDENT_AMBULATORY_CARE_PROVIDER_SITE_OTHER): Payer: BC Managed Care – PPO | Admitting: Nurse Practitioner

## 2020-02-29 ENCOUNTER — Encounter (INDEPENDENT_AMBULATORY_CARE_PROVIDER_SITE_OTHER): Payer: Self-pay

## 2020-02-29 VITALS — BP 124/80 | HR 65 | Ht 71.0 in | Wt 252.0 lb

## 2020-02-29 DIAGNOSIS — I82431 Acute embolism and thrombosis of right popliteal vein: Secondary | ICD-10-CM

## 2020-02-29 DIAGNOSIS — Z20822 Contact with and (suspected) exposure to covid-19: Secondary | ICD-10-CM | POA: Diagnosis not present

## 2020-02-29 DIAGNOSIS — Z01812 Encounter for preprocedural laboratory examination: Secondary | ICD-10-CM | POA: Diagnosis not present

## 2020-02-29 DIAGNOSIS — I10 Essential (primary) hypertension: Secondary | ICD-10-CM | POA: Diagnosis not present

## 2020-02-29 DIAGNOSIS — I872 Venous insufficiency (chronic) (peripheral): Secondary | ICD-10-CM

## 2020-02-29 DIAGNOSIS — E1140 Type 2 diabetes mellitus with diabetic neuropathy, unspecified: Secondary | ICD-10-CM | POA: Diagnosis not present

## 2020-02-29 NOTE — Telephone Encounter (Signed)
Patient was seen today and is now scheduled with Dr. Lucky Cowboy for a RLE thrombectomy on 03/02/20 with a 12:15 pm arrival time to the [facility]. Patient will do covid testing today 02/29/20  between 8-1 pm at the [facility]. Pre-procedure instructions were discussed and given to the patient.

## 2020-03-01 ENCOUNTER — Other Ambulatory Visit (INDEPENDENT_AMBULATORY_CARE_PROVIDER_SITE_OTHER): Payer: Self-pay | Admitting: Nurse Practitioner

## 2020-03-01 LAB — SARS CORONAVIRUS 2 (TAT 6-24 HRS): SARS Coronavirus 2: NEGATIVE

## 2020-03-02 ENCOUNTER — Encounter (INDEPENDENT_AMBULATORY_CARE_PROVIDER_SITE_OTHER): Payer: Self-pay | Admitting: Nurse Practitioner

## 2020-03-02 ENCOUNTER — Encounter: Payer: Self-pay | Admitting: Vascular Surgery

## 2020-03-02 ENCOUNTER — Ambulatory Visit
Admission: RE | Admit: 2020-03-02 | Discharge: 2020-03-02 | Disposition: A | Discharge status: Home / Self Care | Payer: BC Managed Care – PPO | Attending: Vascular Surgery | Admitting: Vascular Surgery

## 2020-03-02 ENCOUNTER — Other Ambulatory Visit: Payer: Self-pay

## 2020-03-02 ENCOUNTER — Encounter
Admission: RE | Disposition: A | Discharge status: Home / Self Care | Payer: Self-pay | Source: Home / Self Care | Attending: Vascular Surgery

## 2020-03-02 DIAGNOSIS — Z6835 Body mass index (BMI) 35.0-35.9, adult: Secondary | ICD-10-CM | POA: Diagnosis not present

## 2020-03-02 DIAGNOSIS — I129 Hypertensive chronic kidney disease with stage 1 through stage 4 chronic kidney disease, or unspecified chronic kidney disease: Secondary | ICD-10-CM | POA: Insufficient documentation

## 2020-03-02 DIAGNOSIS — Z7901 Long term (current) use of anticoagulants: Secondary | ICD-10-CM | POA: Insufficient documentation

## 2020-03-02 DIAGNOSIS — N183 Chronic kidney disease, stage 3 unspecified: Secondary | ICD-10-CM | POA: Diagnosis not present

## 2020-03-02 DIAGNOSIS — G4733 Obstructive sleep apnea (adult) (pediatric): Secondary | ICD-10-CM | POA: Diagnosis not present

## 2020-03-02 DIAGNOSIS — I82421 Acute embolism and thrombosis of right iliac vein: Secondary | ICD-10-CM | POA: Insufficient documentation

## 2020-03-02 DIAGNOSIS — I82401 Acute embolism and thrombosis of unspecified deep veins of right lower extremity: Secondary | ICD-10-CM

## 2020-03-02 DIAGNOSIS — E1122 Type 2 diabetes mellitus with diabetic chronic kidney disease: Secondary | ICD-10-CM | POA: Diagnosis not present

## 2020-03-02 DIAGNOSIS — E669 Obesity, unspecified: Secondary | ICD-10-CM | POA: Diagnosis not present

## 2020-03-02 DIAGNOSIS — E1140 Type 2 diabetes mellitus with diabetic neuropathy, unspecified: Secondary | ICD-10-CM | POA: Diagnosis not present

## 2020-03-02 DIAGNOSIS — Z79899 Other long term (current) drug therapy: Secondary | ICD-10-CM | POA: Diagnosis not present

## 2020-03-02 DIAGNOSIS — E785 Hyperlipidemia, unspecified: Secondary | ICD-10-CM | POA: Diagnosis not present

## 2020-03-02 HISTORY — PX: PERIPHERAL VASCULAR THROMBECTOMY: CATH118306

## 2020-03-02 LAB — CREATININE, SERUM
Creatinine, Ser: 1.06 mg/dL — ABNORMAL HIGH (ref 0.44–1.00)
GFR calc Af Amer: 60 mL/min — ABNORMAL LOW (ref >60)
GFR calc non Af Amer: 52 mL/min — ABNORMAL LOW (ref >60)

## 2020-03-02 LAB — BUN: BUN: 19 mg/dL (ref 8–23)

## 2020-03-02 SURGERY — PERIPHERAL VASCULAR THROMBECTOMY
Anesthesia: Moderate Sedation | Laterality: Right

## 2020-03-02 MED ORDER — CEFAZOLIN SODIUM-DEXTROSE 2-4 GM/100ML-% IV SOLN
INTRAVENOUS | Status: AC
  Administered 2020-03-02: 2 g via INTRAVENOUS
  Filled 2020-03-02: qty 100

## 2020-03-02 MED ORDER — METHYLPREDNISOLONE SODIUM SUCC 125 MG IJ SOLR: 125.0000 mg | Freq: Once | INTRAMUSCULAR | Status: DC | PRN

## 2020-03-02 MED ORDER — FAMOTIDINE 20 MG PO TABS: 40.0000 mg | ORAL_TABLET | Freq: Once | ORAL | Status: DC | PRN

## 2020-03-02 MED ORDER — DIPHENHYDRAMINE HCL 50 MG/ML IJ SOLN: 50.0000 mg | Freq: Once | INTRAMUSCULAR | Status: DC | PRN

## 2020-03-02 MED ORDER — MIDAZOLAM HCL 2 MG/ML PO SYRP: 8.0000 mg | ORAL_SOLUTION | Freq: Once | ORAL | Status: DC | PRN

## 2020-03-02 MED ORDER — FENTANYL CITRATE (PF) 100 MCG/2ML IJ SOLN
INTRAMUSCULAR | Status: AC
  Filled 2020-03-02: qty 2

## 2020-03-02 MED ORDER — MIDAZOLAM HCL 2 MG/2ML IJ SOLN
INTRAMUSCULAR | Status: DC | PRN
  Administered 2020-03-02 (×2): 2 mg via INTRAVENOUS

## 2020-03-02 MED ORDER — HYDROMORPHONE HCL 1 MG/ML IJ SOLN: 1.0000 mg | Freq: Once | INTRAMUSCULAR | Status: DC | PRN

## 2020-03-02 MED ORDER — IODIXANOL 320 MG/ML IV SOLN
INTRAVENOUS | Status: DC | PRN
  Administered 2020-03-02: 55 mL via INTRAVENOUS

## 2020-03-02 MED ORDER — ONDANSETRON HCL 4 MG/2ML IJ SOLN: 4.0000 mg | Freq: Four times a day (QID) | INTRAMUSCULAR | Status: DC | PRN

## 2020-03-02 MED ORDER — FENTANYL CITRATE (PF) 100 MCG/2ML IJ SOLN
INTRAMUSCULAR | Status: DC | PRN
  Administered 2020-03-02 (×2): 50 ug via INTRAVENOUS

## 2020-03-02 MED ORDER — CEFAZOLIN SODIUM-DEXTROSE 2-4 GM/100ML-% IV SOLN: 2.0000 g | Freq: Once | INTRAVENOUS | Status: AC

## 2020-03-02 MED ORDER — SODIUM CHLORIDE 0.9 % IV SOLN: INTRAVENOUS | Status: DC

## 2020-03-02 MED ORDER — MIDAZOLAM HCL 2 MG/2ML IJ SOLN
INTRAMUSCULAR | Status: AC
  Filled 2020-03-02: qty 2

## 2020-03-02 SURGICAL SUPPLY — 15 items
CANNULA 5F STIFF (CANNULA) ×1 IMPLANT
CATH BEACON 5 .035 65 KMP TIP (CATHETERS) ×1 IMPLANT
CATH BEACON 5 .038 100 VERT TP (CATHETERS) ×1 IMPLANT
CATH CXI SUPP ST 4FR 90CM (CATHETERS) ×1 IMPLANT
GLIDEWIRE ADV .035X180CM (WIRE) ×1 IMPLANT
GLIDEWIRE ADV .035X260CM (WIRE) ×1 IMPLANT
NDL ENTRY 21GA 7CM ECHOTIP (NEEDLE) IMPLANT
NEEDLE ENTRY 21GA 7CM ECHOTIP (NEEDLE) ×2 IMPLANT
PACK ANGIOGRAPHY (CUSTOM PROCEDURE TRAY) ×1 IMPLANT
SHEATH BRITE TIP 6FRX11 (SHEATH) ×1 IMPLANT
SHEATH PINNACLE 11FRX10 (SHEATH) ×1 IMPLANT
SYR MEDRAD MARK 7 150ML (SYRINGE) ×1 IMPLANT
TUBING CONTRAST HIGH PRESS 72 (TUBING) ×2 IMPLANT
WIRE J 3MM .035X145CM (WIRE) ×2 IMPLANT
WIRE NITINOL .018 (WIRE) ×1 IMPLANT

## 2020-03-02 NOTE — Discharge Instructions (Signed)
Catheter-Directed Thrombolysis, Care After This sheet gives you information about how to care for yourself after your procedure. Your health care provider may also give you more specific instructions. If you have problems or questions, contact your health care provider. What can I expect after the procedure? After the procedure, it is common to have mild pain around your incision. Follow these instructions at home: Incision care   Follow instructions from your health care provider about how to take care of your incision. Make sure you: ? Wash your hands with soap and water before and after you change your bandage (dressing). If soap and water are not available, use hand sanitizer. ? Change your dressing as told by your health care provider.  Keep the incision area clean and dry.  Do not take baths, swim, or use a hot tub until your health care provider approves. Ask your health care provider if you may take showers. You may only be allowed to take sponge baths.  Check your incision area every day for signs of infection. Check for: ? Redness, swelling, or more pain. ? Fluid or blood. ? Warmth. ? Pus or a bad smell. Medicines  Take over-the-counter and prescription medicines only as told by your health care provider.  If you are taking blood thinners: ? Talk with your health care provider before you take any medicines that contain aspirin or NSAIDs, such as ibuprofen. These medicines increase your risk for dangerous bleeding. ? Take your medicine exactly as told, at the same time every day. ? Avoid activities that could cause injury or bruising, and follow instructions about how to prevent falls. ? Wear a medical alert bracelet or carry a card that lists what medicines you take. Activity   Do not drive until your health care provider approves.  Return to your normal activities as told by your health care provider. Ask your health care provider what activities are safe for  you. General instructions   Raise (elevate) your legs above the level of your heart when sitting or lying down. You can do this by putting pillows under your legs.  Wear compression stockings as told by your health care provider. These stockings help to prevent blood clots and reduce swelling in your legs.  Drink enough fluid to keep your urine pale yellow.  Do not use any products that contain nicotine or tobacco, such as cigarettes, e-cigarettes, and chewing tobacco. If you need help quitting, ask your health care provider.  Keep all follow-up visits as told by your health care provider. This is important. Contact a health care provider if:  You have redness, swelling, or more pain around your incision.  You have fluid or blood coming from your incision.  Your incision feels warm to the touch.  You have pus or a bad smell coming from your incision.  You have chills or a fever.  You bleed or bruise easily.  You have blood in your urine or stool. Get help right away if:  You have any symptoms of a stroke. "BE FAST" is an easy way to remember the main warning signs of a stroke: ? B - Balance. Signs are dizziness, sudden trouble walking, or loss of balance. ? E - Eyes. Signs are trouble seeing or a sudden change in vision. ? F - Face. Signs are sudden weakness or numbness of the face, or the face or eyelid drooping on one side. ? A - Arms. Signs are weakness or numbness in an arm. This happens suddenly   and usually on one side of the body. ? S - Speech. Signs are sudden trouble speaking, slurred speech, or trouble understanding what people say. ? T - Time. Time to call emergency services. Write down what time symptoms started.  You have other signs of a stroke, such as: ? A sudden, severe headache with no known cause. ? Nausea or vomiting. ? Seizure.  You have bleeding that does not stop after you apply pressure with your hands for several minutes.  You have chest  pain.  You have difficulty breathing.  You cough up blood.  You have redness, warmth, swelling, and pain in an arm or leg. These symptoms may represent a serious problem that is an emergency. Do not wait to see if the symptoms will go away. Get medical help right away. Call your local emergency services (911 in the [place] Do not drive yourself to the hospital. Summary  After the procedure, it is common to have mild pain around your incision.  Follow instructions from your health care provider about how to take care of your incision.  If you are taking blood thinners, talk with your health care provider before you take any medicines that contain aspirin or NSAIDs. Follow instructions about how to prevent falls.  Contact your health care provider if you have signs of an infected incision, such as redness, swelling, or more pain.  Get help right away if you have signs of stroke. BE FAST is an easy way to remember the warning signs of stroke. This information is not intended to replace advice given to you by your health care provider. Make sure you discuss any questions you have with your health care provider. Document Revised: 03/14/2019 Document Reviewed: 03/14/2019 Elsevier Patient Education  2020 Elsevier Inc.  

## 2020-03-02 NOTE — Progress Notes (Signed)
Subjective:    Patient ID: Becky Trevino, female    DOB: Jan 23, 1946, 74 y.o.   MRN: [id number] Chief Complaint  Patient presents with  . Follow-up    U/S Follow up    The patient presents today with concern for worsening and propagating DVT.  The patient underwent right ankle fracture ORIF.  She had a complicated course with nonhealing of the incisions however that is completely closed now.  Following her fracture and surgical intervention the patient had a DVT to the right popliteal vein however the patient had a spontaneous hematoma in the left psoas.  Therefore she had to stop anticoagulation and an IVC filter was placed on 11/02/2019.  The patient subsequently went to see her PCP due to worsening leg swelling on 02/03/2020 and was found to have propagation of the right lower extremity DVT.  It was found to be extending from the popliteal vein to the common femoral vein.  The patient also reports having some left lower extremity swelling as well as with numbness following IVC filter placement on 11/02/2019.  Since her last office visit the patient has been wearing compression wraps on a daily basis.  However, it was noted that the patient began to have severe swelling   Today noninvasive studies show an age-indeterminate DVT involving the right common femoral vein, saphenofemoral junction, right femoral vein and right popliteal vein.  The left lower extremity also has findings consistent with an age-indeterminate DVT involving the left common femoral vein, saphenofemoral junction, left femoral vein and left popliteal vein.  The test was limited due to body habitus as well as severe edema.  There is recannulized flow in all areas.      Review of Systems  Cardiovascular: Positive for leg swelling.  Hematological: Bruises/bleeds easily.  All other systems reviewed and are negative.      Objective:   Physical Exam Vitals reviewed.  Constitutional:      Appearance: She is obese.  HENT:       Head: Normocephalic.  Cardiovascular:     Rate and Rhythm: Normal rate and regular rhythm.     Pulses: Normal pulses.     Heart sounds: Normal heart sounds.  Pulmonary:     Effort: Pulmonary effort is normal.     Breath sounds: Normal breath sounds.  Musculoskeletal:     Right lower leg: 3+ Edema present.     Left lower leg: 2+ Edema present.  Neurological:     Mental Status: She is alert.     Motor: Weakness present.  Psychiatric:        Mood and Affect: Mood normal.        Behavior: Behavior normal.        Thought Content: Thought content normal.        Judgment: Judgment normal.     BP 124/80   Pulse 65   Ht _0  (1.803 m)   Wt 252 lb (114.3 kg)   BMI 35.15 kg/m   Past Medical History:  Diagnosis Date  . Cervical spondylosis   . Chronic kidney disease    Stage 3  . DDD (degenerative disc disease), cervical    [facility]  . Diabetes mellitus without complication ([place])   . Diverticulosis   . Hyperlipidemia   . Hypertension   . Intertrigo   . Leg cramps   . Leg varices   . Obesity   . OSA (obstructive sleep apnea)   . Symptomatic menopausal or female climacteric  states   . Syncope     Social History   Socioeconomic History  . Marital status: Married    Spouse name: Not on file  . Number of children: 1  . Years of education: Not on file  . Highest education level: Not on file  Occupational History  . Not on file  Tobacco Use  . Smoking status: Never Smoker  . Smokeless tobacco: Never Used  Vaping Use  . Vaping Use: Never used  Substance and Sexual Activity  . Alcohol use: No    Alcohol/week: 0.0 standard drinks  . Drug use: No  . Sexual activity: Yes    Partners: Male  Other Topics Concern  . Not on file  Social History Narrative   Married, one adopted grown child    Social Determinants of Health   Financial Resource Strain: Low Risk   . Difficulty of Paying Living Expenses: Not hard at all  Food Insecurity: No Food  Insecurity  . Worried About Charity fundraiser in the Last Year: Never true  . Ran Out of Food in the Last Year: Never true  Transportation Needs: No Transportation Needs  . Lack of Transportation (Medical): No  . Lack of Transportation (Non-Medical): No  Physical Activity: Insufficiently Active  . Days of Exercise per Week: 3 days  . Minutes of Exercise per Session: 30 min  Stress: No Stress Concern Present  . Feeling of Stress : Not at all  Social Connections: Moderately Integrated  . Frequency of Communication with Friends and Family: More than three times a week  . Frequency of Social Gatherings with Friends and Family: More than three times a week  . Attends Religious Services: More than 4 times per year  . Active Member of Clubs or Organizations: No  . Attends Archivist Meetings: Never  . Marital Status: Married  Human resources officer Violence: Not At Risk  . Fear of Current or Ex-Partner: No  . Emotionally Abused: No  . Physically Abused: No  . Sexually Abused: No    Past Surgical History:  Procedure Laterality Date  . ABDOMINAL HYSTERECTOMY  1993   Total  . BUNIONECTOMY Bilateral 1993  . I & D KNEE WITH POLY EXCHANGE Right 11/19/2017   Procedure: RIGHT KNEE POLY EXCHANGE WITH IRRIGATION AND DEBRIDEMENT;  Surgeon: Hessie Knows, MD;  Location: [facility];  Service: Orthopedics;  Laterality: Right;  . JOINT REPLACEMENT     bilateral knee  . LAMINECTOMY  11/14/2013    Cervical Fusion , [facility], Dr. Delilah Shan  . LASER ABLATION Bilateral 07/29/2012   Dr. Lucky Cowboy  . LOWER EXTREMITY ANGIOGRAPHY Right 11/02/2019   Procedure: Lower Extremity Angiography;  Surgeon: Katha Cabal, MD;  Location: [facility];  Service: Cardiovascular;  Laterality: Right;  . ORIF ANKLE FRACTURE Right 09/02/2019   Procedure: OPEN REDUCTION INTERNAL FIXATION (ORIF) ANKLE FRACTURE BIMALLEOLAR;  Surgeon: [name], DPM;  Location: [facility];  Service: Podiatry;  Laterality: Right;  .  REPLACEMENT TOTAL KNEE Left 03/2010   Dr. Rudene Christians  . TOTAL HIP ARTHROPLASTY Left 12/11/2015   Procedure: TOTAL HIP ARTHROPLASTY ANTERIOR APPROACH;  Surgeon: Hessie Knows, MD;  Location: [facility];  Service: Orthopedics;  Laterality: Left;  . TOTAL KNEE ARTHROPLASTY Right 03/11/2016   Procedure: TOTAL KNEE ARTHROPLASTY;  Surgeon: Hessie Knows, MD;  Location: [facility];  Service: Orthopedics;  Laterality: Right;    Family History  Problem Relation Age of Onset  . Diabetes Mother   . Hyperlipidemia Mother   .  Hypertension Mother   . Diabetes Father   . Hyperlipidemia Father   . Hypertension Father   . Obesity Father   . Hypertension Sister   . Hyperlipidemia Sister   . Hyperlipidemia Sister   . Breast cancer Neg Hx     Allergies  Allergen Reactions  . Lisinopril Swelling       Assessment & Plan:   1. Acute deep vein thrombosis (DVT) of popliteal vein of right lower extremity (HCC) The patient will continue anticoagulation for now as there have not been any problems or complications at this point.  The patient had placement of an IVC filter on 11/02/2019.    The patient will undergo a right lower extremity thrombectomy. Risk and benefits were reviewed the patient.  Indications for the procedure were reviewed.  All questions were answered, the patient agrees to proceed.     2. Benign essential HTN Continue antihypertensive medications as already ordered, these medications have been reviewed and there are no changes at this time.   3. Type 2 diabetes mellitus with diabetic neuropathy, without long-term current use of insulin (HCC) Continue hypoglycemic medications as already ordered, these medications have been reviewed and there are no changes at this time.  Hgb A1C to be monitored as already arranged by primary service    Current Outpatient Medications on File Prior to Visit  Medication Sig Dispense Refill  . acetaminophen (TYLENOL) 650 MG CR tablet Take 650-1,300 mg by mouth 2  (two) times daily as needed for pain.    Marland Kitchen apixaban (ELIQUIS) 2.5 MG TABS tablet Take 1 tablet (2.5 mg total) by mouth 2 (two) times daily. 60 tablet 1  . atorvastatin (LIPITOR) 40 MG tablet Take 1 tablet (40 mg total) by mouth every evening. 90 tablet 1  . blood glucose meter kit and supplies Dispense based on patient and insurance preference. Use up to four times daily as directed. (FOR ICD-10 E10.9, E11.9). 1 each 0  . Calcium Carbonate-Vitamin D (CALCIUM 600-D) 600-400 MG-UNIT per tablet Take 1 tablet by mouth 2 (two) times daily.    Marland Kitchen doxycycline (VIBRAMYCIN) 100 MG capsule Take 100 mg by mouth 2 (two) times daily.    . fluticasone (FLONASE) 50 MCG/ACT nasal spray Place 2 sprays into both nostrils daily. 16 g 2  . furosemide (LASIX) 20 MG tablet Take 20 mg by mouth every evening.    Marland Kitchen glucose blood (ONE TOUCH ULTRA TEST) test strip Use as instructed 100 each 12  . HYDROcodone-acetaminophen (NORCO/VICODIN) 5-325 MG tablet Take 1 tablet by mouth every 6 (six) hours as needed for moderate pain or severe pain. 20 tablet 0  . imipramine (TOFRANIL) 25 MG tablet Take 1 tablet (25 mg total) by mouth at bedtime. 90 tablet 3  . ketoconazole (NIZORAL) 2 % cream Apply 1 application topically 2 (two) times daily as needed (rash).     . Magnesium Oxide 500 MG CAPS Take 500 mg by mouth daily.     . metoprolol succinate (TOPROL-XL) 100 MG 24 hr tablet TAKE 1 TABLET (100 MG TOTAL) BY MOUTH EVERY EVENING. TAKE WITH OR IMMEDIATELY FOLLOWING A MEAL. 90 tablet 1  . midodrine (PROAMATINE) 5 MG tablet Take 1 tablet (5 mg total) by mouth 3 (three) times daily with meals. (Patient taking differently: Take 5 mg by mouth 3 (three) times daily. ) 90 tablet 0  . mirabegron ER (MYRBETRIQ) 25 MG TB24 tablet Take 1 tablet (25 mg total) by mouth daily. 90 tablet 3  . Netarsudil-Latanoprost (  ROCKLATAN) 0.02-0.005 % SOLN Place 1 drop into both eyes at bedtime.    . potassium chloride (KLOR-CON) 10 MEQ tablet Take 10 mEq by mouth  daily.    Marland Kitchen SIMBRINZA 1-0.2 % SUSP Place 1 drop into both eyes 2 (two) times daily.     No current facility-administered medications on file prior to visit.    There are no Patient Instructions on file for this visit. No follow-ups on file.   Kris Hartmann, NP

## 2020-03-02 NOTE — H&P (Signed)
[facility] History & Physical Update  The patient was interviewed and re-examined.  The patient's previous History and Physical has been reviewed and is unchanged.  There is no change in the plan of care. We plan to proceed with the scheduled procedure.  Leotis Pain, MD  03/02/2020, 12:48 PM

## 2020-03-02 NOTE — Op Note (Signed)
[facility]   OPERATIVE NOTE   PRE-OPERATIVE DIAGNOSIS: extensive right lower extremity DVT  POST-OPERATIVE DIAGNOSIS: same   PROCEDURE: 1. US guidance for vascular access to right popliteal vein  2. Catheter placement into right iliac vein from right popliteal vein approach  3. IVC gram and right lower extremity venogram    SURGEON: Leotis Pain, MD  ASSISTANT(S): none  ANESTHESIA: local with moderate conscious sedation for 45 minutes using 4 mg of Versed and 100 mcg of Fentanyl  ESTIMATED BLOOD LOSS: 10 cc  FINDING(S): 1. 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  This was relatively chronic with large collaterals.  There was essentially no thrombus in the right common femoral vein, superficial femoral vein, or popliteal vein.  SPECIMEN(S): none  INDICATIONS:  Patient is a 74 y.o. female who presents with marked leg swelling and extension of a DVT by duplex examination. Patient has marked leg swelling and pain. Venous intervention is performed to reduce the symtpoms and avoid long term postphlebitic symptoms.   DESCRIPTION: After obtaining full informed written consent, the patient was brought back to the vascular suite and placed supine upon the table.Moderate conscious sedation was administered during a face to face encounter with the patient throughout the procedure with my supervision of the RN administering medicines and monitoring the patient's vital signs, pulse oximetry, telemetry and mental status throughout from the start of the procedure until the patient was taken to the recovery room. After obtaining adequate anesthesia, the patient was prepped and draped in the standard fashion.  The patient was then placed into the prone position. The right popliteal vein was then accessed under direct ultrasound guidance with significant difficulty with a micropuncture needle and a permanent image was recorded. I then upsized to an  6Fr sheath over a J wire. Imaging through the sheath showed no obvious clot in the right popliteal vein, superficial femoral vein, or common femoral vein but more central flow was very sluggish and difficult to evaluate.  I then sequentially advanced advantage wire and a Kumpe catheter up through the superficial femoral vein, common femoral vein, and into the external iliac vein where imaging was performed.  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  This had a very chronic appearance.  Multiple attempts with several different catheters and the advantage wire were used to try to cross this chronic iliac vein and IVC occlusion without success.  It was clear this more chronic occlusion would not be treatable today particular from a popliteal approach.  Consideration for a jugular and/or femoral approach could be given, but the likelihood of success is relatively low.  I then elected to terminate the procedure. The sheath was removed and a dressing was placed. She was taken to the recovery room in stable condition having tolerated the procedure well.   COMPLICATIONS: None  CONDITION: Stable  Leotis Pain 03/02/2020 4:17 PM

## 2020-03-05 ENCOUNTER — Encounter: Payer: Self-pay | Admitting: Vascular Surgery

## 2020-03-06 DIAGNOSIS — L089 Local infection of the skin and subcutaneous tissue, unspecified: Secondary | ICD-10-CM | POA: Diagnosis not present

## 2020-03-06 DIAGNOSIS — Z792 Long term (current) use of antibiotics: Secondary | ICD-10-CM | POA: Diagnosis not present

## 2020-03-06 DIAGNOSIS — T148XXA Other injury of unspecified body region, initial encounter: Secondary | ICD-10-CM | POA: Diagnosis not present

## 2020-03-06 DIAGNOSIS — T8450XD Infection and inflammatory reaction due to unspecified internal joint prosthesis, subsequent encounter: Secondary | ICD-10-CM | POA: Diagnosis not present

## 2020-03-14 ENCOUNTER — Ambulatory Visit (INDEPENDENT_AMBULATORY_CARE_PROVIDER_SITE_OTHER): Payer: BC Managed Care – PPO | Admitting: Nurse Practitioner

## 2020-03-16 ENCOUNTER — Ambulatory Visit (INDEPENDENT_AMBULATORY_CARE_PROVIDER_SITE_OTHER): Payer: BC Managed Care – PPO | Admitting: Nurse Practitioner

## 2020-03-18 ENCOUNTER — Other Ambulatory Visit: Payer: Self-pay | Admitting: Family Medicine

## 2020-03-18 DIAGNOSIS — N182 Chronic kidney disease, stage 2 (mild): Secondary | ICD-10-CM

## 2020-03-18 DIAGNOSIS — E1122 Type 2 diabetes mellitus with diabetic chronic kidney disease: Secondary | ICD-10-CM

## 2020-03-18 NOTE — Telephone Encounter (Signed)
Requested Prescriptions  Pending Prescriptions Disp Refills  . ACCU-CHEK GUIDE test strip [Pharmacy Med Name: ACCU-CHEK GUIDE TEST STRIP] 100 strip 12    Sig: CHECK FASTING BLOOD SUGAR ONCE DAILY AND UP TO 4 TIMES DAILY AS DIRECTED     Endocrinology: Diabetes - Testing Supplies Passed - 03/18/2020  9:26 AM      Passed - Valid encounter within last 12 months    Recent Outpatient Visits          1 month ago Type 2 diabetes mellitus with diabetic neuropathy, without long-term current use of insulin [facility])   [facility] [place], Drue Stager, MD   5 months ago Chronic infection of right knee [facility])   [facility] [name], MD   6 months ago Hospital discharge follow-up   [facility] [name], MD   10 months ago Type 2 diabetes mellitus with diabetic neuropathy, without long-term current use of insulin [facility])   [facility] [place], Drue Stager, MD   1 year ago Type 2 diabetes mellitus with diabetic neuropathy, without long-term current use of insulin [facility])   [facility] [name], MD      Future Appointments            In 2 months Ancil Boozer, Drue Stager, MD [facility], Mariano Colon   In 2 months [name], Gordan Payment [facility]

## 2020-03-21 DIAGNOSIS — L03115 Cellulitis of right lower limb: Secondary | ICD-10-CM | POA: Diagnosis not present

## 2020-03-23 ENCOUNTER — Encounter (INDEPENDENT_AMBULATORY_CARE_PROVIDER_SITE_OTHER): Payer: Self-pay | Admitting: Nurse Practitioner

## 2020-03-23 ENCOUNTER — Ambulatory Visit (INDEPENDENT_AMBULATORY_CARE_PROVIDER_SITE_OTHER): Payer: BC Managed Care – PPO | Admitting: Nurse Practitioner

## 2020-03-23 ENCOUNTER — Other Ambulatory Visit: Payer: Self-pay

## 2020-03-23 VITALS — BP 124/84 | HR 63 | Resp 16

## 2020-03-23 DIAGNOSIS — I10 Essential (primary) hypertension: Secondary | ICD-10-CM | POA: Diagnosis not present

## 2020-03-23 DIAGNOSIS — I82431 Acute embolism and thrombosis of right popliteal vein: Secondary | ICD-10-CM

## 2020-03-23 DIAGNOSIS — Z87898 Personal history of other specified conditions: Secondary | ICD-10-CM

## 2020-03-23 DIAGNOSIS — I890 Lymphedema, not elsewhere classified: Secondary | ICD-10-CM

## 2020-03-26 ENCOUNTER — Encounter (INDEPENDENT_AMBULATORY_CARE_PROVIDER_SITE_OTHER): Payer: Self-pay | Admitting: Nurse Practitioner

## 2020-03-26 DIAGNOSIS — M25461 Effusion, right knee: Secondary | ICD-10-CM | POA: Diagnosis not present

## 2020-03-26 DIAGNOSIS — E1140 Type 2 diabetes mellitus with diabetic neuropathy, unspecified: Secondary | ICD-10-CM | POA: Diagnosis not present

## 2020-03-26 DIAGNOSIS — S7002XA Contusion of left hip, initial encounter: Secondary | ICD-10-CM | POA: Diagnosis not present

## 2020-03-26 DIAGNOSIS — M25561 Pain in right knee: Secondary | ICD-10-CM | POA: Diagnosis not present

## 2020-03-26 DIAGNOSIS — R2681 Unsteadiness on feet: Secondary | ICD-10-CM | POA: Diagnosis not present

## 2020-03-26 DIAGNOSIS — Z96651 Presence of right artificial knee joint: Secondary | ICD-10-CM | POA: Diagnosis not present

## 2020-03-26 DIAGNOSIS — L03115 Cellulitis of right lower limb: Secondary | ICD-10-CM | POA: Diagnosis not present

## 2020-03-26 NOTE — Progress Notes (Addendum)
Subjective:    Patient ID: Becky Trevino, female    DOB: [birth year], 74 y.o.   MRN: [id number] Chief Complaint  Patient presents with  . Follow-up    [facility] 2wk follow up    Today the patient presents post right lower extremity thrombectomy.  The initial DVT was noted in her right popliteal vein by [facility] on 10/27/2019.  Initially the patient with conservative management of Eliquis however a subsequent ultrasound done at [facility] on 02/03/2020 was concerning for possible propagation of her DVT.  The patient underwent right lower extremity thrombectomy on 03/02/2020.  Findings suggest that there is no evidence of DVT in the common femoral vein, superficial femoral or popliteal vein.  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  The patient has developed large collaterals.  A bigger issue for the patient however, is swelling in her left hip.  The patient previously had a spontaneous hematoma in this area that had resolved.  However the patient has recently had more swelling in this area and there is concern as to the cause.  The area is mainly located at her hip and buttock area and radiates of her hip.  The patient's distal lower extremities are not experiencing any worsening swelling.  The patient consistently wears medical grade 1 compression wraps on a daily basis in addition to elevation.  However, the swelling makes it difficult for her to walk.  She denies any fever, chills, nausea, vomiting or diarrhea.   Review of Systems  Cardiovascular: Positive for leg swelling.  Musculoskeletal: Positive for arthralgias, gait problem and joint swelling.  All other systems reviewed and are negative.      Objective:   Physical Exam Vitals reviewed.  Constitutional:      Appearance: She is obese.  HENT:     Head: Normocephalic.  Cardiovascular:     Rate and Rhythm: Normal rate and regular  rhythm.  Musculoskeletal:     Right lower leg: 3+ Edema present.     Left lower leg: 4+ Edema present.     Comments: Extensive swelling along left hip extending up towards back  Skin:    Comments: Dermal thickening bilaterally   Neurological:     Mental Status: She is alert.     Motor: Weakness present.  Psychiatric:        Mood and Affect: Mood normal.        Behavior: Behavior normal.        Thought Content: Thought content normal.        Judgment: Judgment normal.     BP 124/84 (BP Location: Left Arm)   Pulse 63   Resp 16   Past Medical History:  Diagnosis Date  . Cervical spondylosis   . Chronic kidney disease    Stage 3  . DDD (degenerative disc disease), cervical    [facility]  . Diabetes mellitus without complication ([place])   . Diverticulosis   . Hyperlipidemia   . Hypertension   . Intertrigo   . Leg cramps   . Leg varices   . Obesity   . OSA (obstructive sleep apnea)   . Symptomatic menopausal or female climacteric states   . Syncope     Social History   Socioeconomic History  . Marital status: Married    Spouse name: Not on file  . Number of children: 1  . Years of education: Not on  file  . Highest education level: Not on file  Occupational History  . Not on file  Tobacco Use  . Smoking status: Never Smoker  . Smokeless tobacco: Never Used  Vaping Use  . Vaping Use: Never used  Substance and Sexual Activity  . Alcohol use: No    Alcohol/week: 0.0 standard drinks  . Drug use: No  . Sexual activity: Yes    Partners: Male  Other Topics Concern  . Not on file  Social History Narrative   Married, one adopted grown child    Social Determinants of Health   Financial Resource Strain: Low Risk   . Difficulty of Paying Living Expenses: Not hard at all  Food Insecurity: No Food Insecurity  . Worried About Charity fundraiser in the Last Year: Never true  . Ran Out of Food in the Last Year: Never true  Transportation Needs: No  Transportation Needs  . Lack of Transportation (Medical): No  . Lack of Transportation (Non-Medical): No  Physical Activity: Insufficiently Active  . Days of Exercise per Week: 3 days  . Minutes of Exercise per Session: 30 min  Stress: No Stress Concern Present  . Feeling of Stress : Not at all  Social Connections: Moderately Integrated  . Frequency of Communication with Friends and Family: More than three times a week  . Frequency of Social Gatherings with Friends and Family: More than three times a week  . Attends Religious Services: More than 4 times per year  . Active Member of Clubs or Organizations: No  . Attends Archivist Meetings: Never  . Marital Status: Married  Human resources officer Violence: Not At Risk  . Fear of Current or Ex-Partner: No  . Emotionally Abused: No  . Physically Abused: No  . Sexually Abused: No    Past Surgical History:  Procedure Laterality Date  . ABDOMINAL HYSTERECTOMY  1993   Total  . BUNIONECTOMY Bilateral 1993  . I & D KNEE WITH POLY EXCHANGE Right 11/19/2017   Procedure: RIGHT KNEE POLY EXCHANGE WITH IRRIGATION AND DEBRIDEMENT;  Surgeon: Hessie Knows, MD;  Location: [facility] ORS;  Service: Orthopedics;  Laterality: Right;  . JOINT REPLACEMENT     bilateral knee  . LAMINECTOMY  11/14/2013    Cervical Fusion , [facility], Dr. Delilah Shan  . LASER ABLATION Bilateral 07/29/2012   Dr. Lucky Cowboy  . LOWER EXTREMITY ANGIOGRAPHY Right 11/02/2019   Procedure: Lower Extremity Angiography;  Surgeon: Katha Cabal, MD;  Location: [place] [facility];  Service: Cardiovascular;  Laterality: Right;  . ORIF ANKLE FRACTURE Right 09/02/2019   Procedure: OPEN REDUCTION INTERNAL FIXATION (ORIF) ANKLE FRACTURE BIMALLEOLAR;  Surgeon: [name], DPM;  Location: [facility] ORS;  Service: Podiatry;  Laterality: Right;  . PERIPHERAL VASCULAR THROMBECTOMY Right 03/02/2020   Procedure: PERIPHERAL VASCULAR THROMBECTOMY;  Surgeon: Algernon Huxley, MD;  Location: [place] [facility];   Service: Cardiovascular;  Laterality: Right;  . REPLACEMENT TOTAL KNEE Left 03/2010   Dr. Rudene Christians  . TOTAL HIP ARTHROPLASTY Left 12/11/2015   Procedure: TOTAL HIP ARTHROPLASTY ANTERIOR APPROACH;  Surgeon: Hessie Knows, MD;  Location: [facility] ORS;  Service: Orthopedics;  Laterality: Left;  . TOTAL KNEE ARTHROPLASTY Right 03/11/2016   Procedure: TOTAL KNEE ARTHROPLASTY;  Surgeon: Hessie Knows, MD;  Location: [facility] OR[facility];  Service: Orthopedics;  Laterality: Right;    Family History  Problem Relation Age of Onset  . Diabetes Mother   . Hyperlipidemia Mother   . Hypertension Mother   . Diabetes Father   .  Hyperlipidemia Father   . Hypertension Father   . Obesity Father   . Hypertension Sister   . Hyperlipidemia Sister   . Hyperlipidemia Sister   . Breast cancer Neg Hx     Allergies  Allergen Reactions  . Lisinopril Swelling       Assessment & Plan:   1. Acute deep vein thrombosis (DVT) of popliteal vein of right lower extremity (HCC) Lower extremity thrombectomy found the right common iliac vein and IVC were occluded.  This is a chronic issue with large collaterals.  There is essentially no thrombus in the right common femoral, superficial femoral or popliteal vein.  Based on this we will follow up with treatment of her lymphedema to help with her lower extremity swelling and discomfort. 2. History of pelvic hematoma The patient recently had a hematoma that was resolved in her left hip however the swelling is now significant again.  There is some concern that he may be recurrent hematoma.  The patient does have a follow-up with her orthopedic surgeon.  Prior to moving forward with any sort of lymphedema treatments we will have orthopedic evaluate the patient for any possible causes of swelling in her hip.  3. Benign essential HTN Continue antihypertensive medications as already ordered, these medications have been reviewed and there are no changes at this time.   4. Lymphedema The patient  does have evidence of lymphedema despite consistent conservative therapy the patient continues to have lower extremity edema.  However, because of the extensive swelling in her left hip, we will delay any lymphedema pump until we are able to accurately assess what is causing the severe swelling in her hip area.  Once this is resolved we will place a referral for a lymphedema pump.  Recommend:  No surgery or intervention at this point in time.    I have reviewed my previous discussion with the patient regarding swelling and why it causes symptoms.  Patient will continue wearing graduated compression stockings class 1 (20-30 mmHg) on a daily basis. The patient will  beginning wearing the stockings first thing in the morning and removing them in the evening. The patient is instructed specifically not to sleep in the stockings.    In addition, behavioral modification including several periods of elevation of the lower extremities during the day will be continued.  This was reviewed with the patient during the initial visit.  The patient will also continue routine exercise, especially walking on a daily basis as was discussed during the initial visit.    Despite conservative treatments of well over 4 weeks including graduated compression therapy class 1 and behavioral modification including exercise and elevation the patient  has not obtained adequate control of the lymphedema.  The patient still has stage 3 lymphedema and therefore, I believe that a lymph pump should be added to improve the control of the patient's lymphedema.  Additionally, a lymph pump is warranted because it will reduce the risk of cellulitis and ulceration in the future.  Patient should follow-up in six months     Current Outpatient Medications on File Prior to Visit  Medication Sig Dispense Refill  . ACCU-CHEK GUIDE test strip CHECK FASTING BLOOD SUGAR ONCE DAILY AND UP TO 4 TIMES DAILY AS DIRECTED 100 strip 12  . acetaminophen  (TYLENOL) 650 MG CR tablet Take 650-1,300 mg by mouth 2 (two) times daily as needed for pain.    Marland Kitchen apixaban (ELIQUIS) 2.5 MG TABS tablet Take 1 tablet (2.5 mg total) by  mouth 2 (two) times daily. 60 tablet 1  . atorvastatin (LIPITOR) 40 MG tablet Take 1 tablet (40 mg total) by mouth every evening. 90 tablet 1  . blood glucose meter kit and supplies Dispense based on patient and insurance preference. Use up to four times daily as directed. (FOR ICD-10 E10.9, E11.9). 1 each 0  . Calcium Carbonate-Vitamin D (CALCIUM 600-D) 600-400 MG-UNIT per tablet Take 1 tablet by mouth 2 (two) times daily.    Marland Kitchen doxycycline (VIBRAMYCIN) 100 MG capsule Take 100 mg by mouth 2 (two) times daily.    . fluticasone (FLONASE) 50 MCG/ACT nasal spray Place 2 sprays into both nostrils daily. 16 g 2  . furosemide (LASIX) 20 MG tablet Take 20 mg by mouth every evening.    Marland Kitchen HYDROcodone-acetaminophen (NORCO/VICODIN) 5-325 MG tablet Take 1 tablet by mouth every 6 (six) hours as needed for moderate pain or severe pain. 20 tablet 0  . imipramine (TOFRANIL) 25 MG tablet Take 1 tablet (25 mg total) by mouth at bedtime. 90 tablet 3  . ketoconazole (NIZORAL) 2 % cream Apply 1 application topically 2 (two) times daily as needed (rash).     . Magnesium Oxide 500 MG CAPS Take 500 mg by mouth daily.     . metoprolol succinate (TOPROL-XL) 100 MG 24 hr tablet TAKE 1 TABLET (100 MG TOTAL) BY MOUTH EVERY EVENING. TAKE WITH OR IMMEDIATELY FOLLOWING A MEAL. 90 tablet 1  . midodrine (PROAMATINE) 5 MG tablet Take 1 tablet (5 mg total) by mouth 3 (three) times daily with meals. (Patient taking differently: Take 5 mg by mouth 3 (three) times daily. ) 90 tablet 0  . mirabegron ER (MYRBETRIQ) 25 MG TB24 tablet Take 1 tablet (25 mg total) by mouth daily. 90 tablet 3  . Netarsudil-Latanoprost (ROCKLATAN) 0.02-0.005 % SOLN Place 1 drop into both eyes at bedtime.    . potassium chloride (KLOR-CON) 10 MEQ tablet Take 10 mEq by mouth daily.    Marland Kitchen SIMBRINZA  1-0.2 % SUSP Place 1 drop into both eyes 2 (two) times daily.     No current facility-administered medications on file prior to visit.    There are no Patient Instructions on file for this visit. No follow-ups on file.   Kris Hartmann, NP

## 2020-04-04 ENCOUNTER — Inpatient Hospital Stay (HOSPITAL_BASED_OUTPATIENT_CLINIC_OR_DEPARTMENT_OTHER): Payer: BC Managed Care – PPO | Admitting: Oncology

## 2020-04-04 ENCOUNTER — Encounter: Payer: Self-pay | Admitting: Oncology

## 2020-04-04 ENCOUNTER — Other Ambulatory Visit: Payer: Self-pay

## 2020-04-04 ENCOUNTER — Other Ambulatory Visit: Payer: Self-pay | Admitting: Oncology

## 2020-04-04 ENCOUNTER — Inpatient Hospital Stay: Payer: BC Managed Care – PPO | Attending: Oncology

## 2020-04-04 VITALS — BP 125/70 | HR 58 | Temp 96.8°F

## 2020-04-04 DIAGNOSIS — D649 Anemia, unspecified: Secondary | ICD-10-CM | POA: Insufficient documentation

## 2020-04-04 DIAGNOSIS — I82411 Acute embolism and thrombosis of right femoral vein: Secondary | ICD-10-CM

## 2020-04-04 DIAGNOSIS — Z95828 Presence of other vascular implants and grafts: Secondary | ICD-10-CM | POA: Diagnosis not present

## 2020-04-04 DIAGNOSIS — Z87898 Personal history of other specified conditions: Secondary | ICD-10-CM

## 2020-04-04 DIAGNOSIS — I82431 Acute embolism and thrombosis of right popliteal vein: Secondary | ICD-10-CM | POA: Diagnosis present

## 2020-04-04 DIAGNOSIS — Z7901 Long term (current) use of anticoagulants: Secondary | ICD-10-CM | POA: Insufficient documentation

## 2020-04-04 LAB — CBC WITH DIFFERENTIAL/PLATELET
Abs Immature Granulocytes: 0.01 10*3/uL (ref 0.00–0.07)
Basophils Absolute: 0 10*3/uL (ref 0.0–0.1)
Basophils Relative: 1 %
Eosinophils Absolute: 0.1 10*3/uL (ref 0.0–0.5)
Eosinophils Relative: 3 %
HCT: 34.9 % — ABNORMAL LOW (ref 36.0–46.0)
Hemoglobin: 11.2 g/dL — ABNORMAL LOW (ref 12.0–15.0)
Immature Granulocytes: 0 %
Lymphocytes Relative: 24 %
Lymphs Abs: 1 10*3/uL (ref 0.7–4.0)
MCH: 28.9 pg (ref 26.0–34.0)
MCHC: 32.1 g/dL (ref 30.0–36.0)
MCV: 90.2 fL (ref 80.0–100.0)
Monocytes Absolute: 0.4 10*3/uL (ref 0.1–1.0)
Monocytes Relative: 9 %
Neutro Abs: 2.7 10*3/uL (ref 1.7–7.7)
Neutrophils Relative %: 63 %
Platelets: 162 10*3/uL (ref 150–400)
RBC: 3.87 MIL/uL (ref 3.87–5.11)
RDW: 18.5 % — ABNORMAL HIGH (ref 11.5–15.5)
WBC: 4.2 10*3/uL (ref 4.0–10.5)
nRBC: 0 % (ref 0.0–0.2)

## 2020-04-04 LAB — COMPREHENSIVE METABOLIC PANEL
ALT: 13 U/L (ref 0–44)
AST: 19 U/L (ref 15–41)
Albumin: 3.4 g/dL — ABNORMAL LOW (ref 3.5–5.0)
Alkaline Phosphatase: 106 U/L (ref 38–126)
Anion gap: 8 (ref 5–15)
BUN: 19 mg/dL (ref 8–23)
CO2: 28 mmol/L (ref 22–32)
Calcium: 9 mg/dL (ref 8.9–10.3)
Chloride: 103 mmol/L (ref 98–111)
Creatinine, Ser: 1.2 mg/dL — ABNORMAL HIGH (ref 0.44–1.00)
GFR calc Af Amer: 52 mL/min — ABNORMAL LOW (ref >60)
GFR calc non Af Amer: 44 mL/min — ABNORMAL LOW (ref >60)
Glucose, Bld: 97 mg/dL (ref 70–99)
Potassium: 4.2 mmol/L (ref 3.5–5.1)
Sodium: 139 mmol/L (ref 135–145)
Total Bilirubin: 0.5 mg/dL (ref 0.3–1.2)
Total Protein: 7.8 g/dL (ref 6.5–8.1)

## 2020-04-04 NOTE — Progress Notes (Signed)
  Hematology/Oncology Follow Up Note [facility]  [phone] [phone]  Patient Care Team: Sowles, Krichna, MD as PCP - General (Family Medicine) Callwood, Dwayne D, MD as Consulting Physician (Cardiology) Troxler, Matthew, DPM as Consulting Physician (Podiatry) Bell, Phillip T., MD as Consulting Physician (Ophthalmology) Menz, Michael, MD as Consulting Physician (Orthopedic Surgery) Cope, Brian S, MD as Consulting Physician (Urology) Keeler, Carolyn, DO as Consulting Physician (Rehabilitation) Ravishankar, Jayashree, MD as Consulting Physician (Infectious Diseases)   Name of the patient: Becky Trevino  [id number]  02/18/1946   REASON FOR VISIT  follow-up for DVT and hematoma  Pertinent hematology history 74 y.o. female with past medical history listed as below presents for follow-up of DVT. She was admitted from 10/27/2019-11/07/2019 for right lower extremity cellulitis/wound infection, bacteremia with group B streptococcus, right lower extremity DVT. # Patient had ankle fracture status post a recent right ankle ORIF on 09/02/2019 by Dr. Bakers.  Patient has a wound VAC She was noticed to have right lower extremity swelling, mild erythema and warmth with associated tenderness. 11/24/2019 US venous right lower extremity showed a DVT within the right popliteal vein, with superficial thrombus seen at the saphenofemoral junction. Patient tells me that she was diagnosed with "blood clots" in August 2020.  At that time she had pain and swelling for about a week and went to ER for evaluation.    04/29/2019 ultrasound was negative for DVT, only showed a superficial thrombophlebitis involving a branch of the great saphenous vein in the proximal thigh.  She was fine to have right Baker's cyst.  Patient denies any immobilization factors contributing to this event.  Patient had elevated D-dimer at that time. Patient was started on Xarelto-starter kit by  emergency room.  Patient was recommended to have US aorta iliac Doppler by primary care provider Dr. Sowles and I do not see the study was done.  Xarelto was continued outpatient for thrombophlebitis..  Patient was hospitalized again at the end of November 2020 due to syncope episodes which likely due to orthostatic hypotension.  Patient was noted to have anemia, CT abdomen pelvis to evaluate after trauma, showed left gluteal hematoma without signs of extravasation.  Xarelto was held during the hospitalization.  Saw cardiology Dr.Callwood.  Patient was seen by Dr. Sowles on 08/23/2019 and the Xarelto was stopped.  09/02/2019 patient slipped and obtained right ankle fracture.  Status post ORIF procedure by Dr. Baker.  Postop patient was recommended to start on Lovenox 40 mg daily injections prophylactically until her presentation to emergency room in February.  At that point, she was found to have a provoked right lower extremity DVT and she was seen by me during that admission.  Patient was recommended to start therapeutic Lovenox.  During the same hospitalization, patient developed spontaneous iliopsoas hematomaand therapeutic Lovenox was discontinued. Patient had IVC filter placed on 11/02/2019.  Patient was recommended to have outpatient follow-up with me. Patient was recently seen by Dr. Sowles and was found to have worsening of right lower extremity swelling, Doppler ultrasound was obtained on 02/03/2020 which was positive for DVT in the right lower extremity, increased clot burden in the right lower extremity since 10/27/2019.  Thrombus extending from the right common femoral vein to the right calf.  Patient was referred back to me for evaluation and management. Today patient was accompanied by her caregiver.  She reports right lower extremity/thigh swelling.  Left thigh numbness since IVC placement.    INTERVAL HISTORY Becky Trevino is   a 74 y.o. female who has above history reviewed by me today  presents for follow up visit for DVT Problems and complaints are listed below: Patient is currently on Eliquis 2.5 mg since last visit.  She feels that lower extremity pain has improved.  However recently there is increase of bilateral hip swelling.  Patient was recently seen by vascular surgery.  Given her history of pelvic hematoma, there was some concern about the swelling being another hematoma.  She denies other bleeding events.  Review of Systems  Constitutional: Positive for fatigue. Negative for appetite change, chills and fever.  HENT:   Negative for hearing loss and voice change.   Eyes: Negative for eye problems.  Respiratory: Negative for chest tightness and cough.   Cardiovascular: Positive for leg swelling. Negative for chest pain.       Hip/thigh swelling  Gastrointestinal: Negative for abdominal distention, abdominal pain and blood in stool.  Endocrine: Negative for hot flashes.  Genitourinary: Positive for nocturia. Negative for difficulty urinating and frequency.   Musculoskeletal: Negative for arthralgias.  Skin: Negative for itching and rash.  Neurological: Negative for extremity weakness.  Hematological: Negative for adenopathy.  Psychiatric/Behavioral: Negative for confusion.      Allergies  Allergen Reactions  . Lisinopril Swelling     Past Medical History:  Diagnosis Date  . Cervical spondylosis   . Chronic kidney disease    Stage 3  . DDD (degenerative disc disease), cervical    [facility]  . Diabetes mellitus without complication (HCC)   . Diverticulosis   . Hyperlipidemia   . Hypertension   . Intertrigo   . Leg cramps   . Leg varices   . Obesity   . OSA (obstructive sleep apnea)   . Symptomatic menopausal or female climacteric states   . Syncope      Past Surgical History:  Procedure Laterality Date  . ABDOMINAL HYSTERECTOMY  1993   Total  . BUNIONECTOMY Bilateral 1993  . I & D KNEE WITH POLY EXCHANGE Right 11/19/2017    Procedure: RIGHT KNEE POLY EXCHANGE WITH IRRIGATION AND DEBRIDEMENT;  Surgeon: Menz, Michael, MD;  Location: [facility] ORS;  Service: Orthopedics;  Laterality: Right;  . JOINT REPLACEMENT     bilateral knee  . LAMINECTOMY  11/14/2013    Cervical Fusion , [facility], Dr. Bagley  . LASER ABLATION Bilateral 07/29/2012   Dr. Dew  . LOWER EXTREMITY ANGIOGRAPHY Right 11/02/2019   Procedure: Lower Extremity Angiography;  Surgeon: Schnier, Gregory G, MD;  Location: [facility] INVASIVE [facility];  Service: Cardiovascular;  Laterality: Right;  . ORIF ANKLE FRACTURE Right 09/02/2019   Procedure: OPEN REDUCTION INTERNAL FIXATION (ORIF) ANKLE FRACTURE BIMALLEOLAR;  Surgeon: Baker, Andrew, DPM;  Location: [facility] ORS;  Service: Podiatry;  Laterality: Right;  . PERIPHERAL VASCULAR THROMBECTOMY Right 03/02/2020   Procedure: PERIPHERAL VASCULAR THROMBECTOMY;  Surgeon: Dew, Jason S, MD;  Location: [facility] INVASIVE CV LAB;  Service: Cardiovascular;  Laterality: Right;  . REPLACEMENT TOTAL KNEE Left 03/2010   Dr. Menz  . TOTAL HIP ARTHROPLASTY Left 12/11/2015   Procedure: TOTAL HIP ARTHROPLASTY ANTERIOR APPROACH;  Surgeon: Michael Menz, MD;  Location: [facility] ORS;  Service: Orthopedics;  Laterality: Left;  . TOTAL KNEE ARTHROPLASTY Right 03/11/2016   Procedure: TOTAL KNEE ARTHROPLASTY;  Surgeon: Michael Menz, MD;  Location: [facility] ORS;  Service: Orthopedics;  Laterality: Right;    Social History   Socioeconomic History  . Marital status: Married    Spouse name: Not on file  . Number of   children: 1  . Years of education: Not on file  . Highest education level: Not on file  Occupational History  . Not on file  Tobacco Use  . Smoking status: Never Smoker  . Smokeless tobacco: Never Used  Vaping Use  . Vaping Use: Never used  Substance and Sexual Activity  . Alcohol use: No    Alcohol/week: 0.0 standard drinks  . Drug use: No  . Sexual activity: Yes    Partners: Male  Other Topics Concern  . Not on file  Social History Narrative    Married, one adopted grown child    Social Determinants of Health   Financial Resource Strain: Low Risk   . Difficulty of Paying Living Expenses: Not hard at all  Food Insecurity: No Food Insecurity  . Worried About Charity fundraiser in the Last Year: Never true  . Ran Out of Food in the Last Year: Never true  Transportation Needs: No Transportation Needs  . Lack of Transportation (Medical): No  . Lack of Transportation (Non-Medical): No  Physical Activity: Insufficiently Active  . Days of Exercise per Week: 3 days  . Minutes of Exercise per Session: 30 min  Stress: No Stress Concern Present  . Feeling of Stress : Not at all  Social Connections: Moderately Integrated  . Frequency of Communication with Friends and Family: More than three times a week  . Frequency of Social Gatherings with Friends and Family: More than three times a week  . Attends Religious Services: More than 4 times per year  . Active Member of Clubs or Organizations: No  . Attends Archivist Meetings: Never  . Marital Status: Married  Human resources officer Violence: Not At Risk  . Fear of Current or Ex-Partner: No  . Emotionally Abused: No  . Physically Abused: No  . Sexually Abused: No    Family History  Problem Relation Age of Onset  . Diabetes Mother   . Hyperlipidemia Mother   . Hypertension Mother   . Diabetes Father   . Hyperlipidemia Father   . Hypertension Father   . Obesity Father   . Hypertension Sister   . Hyperlipidemia Sister   . Hyperlipidemia Sister   . Breast cancer Neg Hx      Current Outpatient Medications:  .  ACCU-CHEK GUIDE test strip, CHECK FASTING BLOOD SUGAR ONCE DAILY AND UP TO 4 TIMES DAILY AS DIRECTED, Disp: 100 strip, Rfl: 12 .  acetaminophen (TYLENOL) 650 MG CR tablet, Take 650-1,300 mg by mouth 2 (two) times daily as needed for pain., Disp: , Rfl:  .  atorvastatin (LIPITOR) 40 MG tablet, Take 1 tablet (40 mg total) by mouth every evening., Disp: 90 tablet, Rfl:  1 .  blood glucose meter kit and supplies, Dispense based on patient and insurance preference. Use up to four times daily as directed. (FOR ICD-10 E10.9, E11.9)., Disp: 1 each, Rfl: 0 .  Calcium Carbonate-Vitamin D (CALCIUM 600-D) 600-400 MG-UNIT per tablet, Take 1 tablet by mouth 2 (two) times daily., Disp: , Rfl:  .  doxycycline (VIBRAMYCIN) 100 MG capsule, Take 100 mg by mouth 2 (two) times daily., Disp: , Rfl:  .  fluticasone (FLONASE) 50 MCG/ACT nasal spray, Place 2 sprays into both nostrils daily., Disp: 16 g, Rfl: 2 .  furosemide (LASIX) 20 MG tablet, Take 20 mg by mouth every evening., Disp: , Rfl:  .  HYDROcodone-acetaminophen (NORCO/VICODIN) 5-325 MG tablet, Take 1 tablet by mouth every 6 (six) hours as needed for  moderate pain or severe pain., Disp: 20 tablet, Rfl: 0 .  imipramine (TOFRANIL) 25 MG tablet, Take 1 tablet (25 mg total) by mouth at bedtime., Disp: 90 tablet, Rfl: 3 .  ketoconazole (NIZORAL) 2 % cream, Apply 1 application topically 2 (two) times daily as needed (rash). , Disp: , Rfl:  .  Magnesium Oxide 500 MG CAPS, Take 500 mg by mouth daily. , Disp: , Rfl:  .  metoprolol succinate (TOPROL-XL) 100 MG 24 hr tablet, TAKE 1 TABLET (100 MG TOTAL) BY MOUTH EVERY EVENING. TAKE WITH OR IMMEDIATELY FOLLOWING A MEAL., Disp: 90 tablet, Rfl: 1 .  midodrine (PROAMATINE) 5 MG tablet, Take 1 tablet (5 mg total) by mouth 3 (three) times daily with meals. (Patient taking differently: Take 5 mg by mouth 3 (three) times daily. ), Disp: 90 tablet, Rfl: 0 .  mirabegron ER (MYRBETRIQ) 25 MG TB24 tablet, Take 1 tablet (25 mg total) by mouth daily., Disp: 90 tablet, Rfl: 3 .  Netarsudil-Latanoprost (ROCKLATAN) 0.02-0.005 % SOLN, Place 1 drop into both eyes at bedtime., Disp: , Rfl:  .  potassium chloride (KLOR-CON) 10 MEQ tablet, Take 10 mEq by mouth daily., Disp: , Rfl:  .  SIMBRINZA 1-0.2 % SUSP, Place 1 drop into both eyes 2 (two) times daily., Disp: , Rfl:   Physical exam:  Vitals:   04/04/20  1014  BP: 125/70  Pulse: (!) 58  Temp: (!) 96.8 F (36 C)   Physical Exam Constitutional:      General: She is not in acute distress.    Appearance: She is obese.     Comments: Patient states in the wheelchair  HENT:     Head: Normocephalic and atraumatic.  Eyes:     General: No scleral icterus. Cardiovascular:     Rate and Rhythm: Normal rate and regular rhythm.     Heart sounds: Normal heart sounds.  Pulmonary:     Effort: Pulmonary effort is normal. No respiratory distress.     Breath sounds: No wheezing.  Abdominal:     General: Bowel sounds are normal. There is no distension.     Palpations: Abdomen is soft.  Musculoskeletal:        General: Swelling present. No deformity. Normal range of motion.     Cervical back: Normal range of motion and neck supple.     Comments: Bilateral lower extremity swelling  Skin:    General: Skin is warm and dry.     Findings: No erythema or rash.  Neurological:     Mental Status: She is alert and oriented to person, place, and time. Mental status is at baseline.     Cranial Nerves: No cranial nerve deficit.     Coordination: Coordination normal.  Psychiatric:        Mood and Affect: Mood normal.     CMP Latest Ref Rng & Units 04/04/2020  Glucose 70 - 99 mg/dL 97  BUN 8 - 23 mg/dL 19  Creatinine 0.44 - 1.00 mg/dL 1.20(H)  Sodium 135 - 145 mmol/L 139  Potassium 3.5 - 5.1 mmol/L 4.2  Chloride 98 - 111 mmol/L 103  CO2 22 - 32 mmol/L 28  Calcium 8.9 - 10.3 mg/dL 9.0  Total Protein 6.5 - 8.1 g/dL 7.8  Total Bilirubin 0.3 - 1.2 mg/dL 0.5  Alkaline Phos 38 - 126 U/L 106  AST 15 - 41 U/L 19  ALT 0 - 44 U/L 13   CBC Latest Ref Rng & Units 04/04/2020  WBC 4.0 -   10.5 K/uL 4.2  Hemoglobin 12.0 - 15.0 g/dL 11.2(L)  Hematocrit 36 - 46 % 34.9(L)  Platelets 150 - 400 K/uL 162    RADIOGRAPHIC STUDIES: I have personally reviewed the radiological images as listed and agreed with the findings in the report. No results found.   Assessment  and plan  1. Acute deep vein thrombosis (DVT) of femoral vein of right lower extremity (HCC)   2. History of pelvic hematoma   3. Presence of IVC filter    #History of proximal lower extremity DVT. Patient has been on Eliquis 2.5 mg twice daily and her pain has improved since the start of Eliquis. However, she has now developed worsening of bilateral lower extremity thigh swelling.  This could be due to lymphedema versus another hematoma. I recommend patient to hold off Eliquis for now.  She has appointment scheduled for ultrasound of lower extremity this week, arranged by vascular surgery. If study shows hematoma, patient will remain off Eliquis due to the poor tolerance. I would like to do further blood work including baseline PT and PTT, mixing study if needed.  Negative antiphospholipid syndrome antibodies, prothrombin gene mutation, factor V Leiden mutation. I asked patient to update me after the ultrasound, depending on results, may need to arrange patient to do additional work-up.   Orders Placed This Encounter  Procedures  . CBC with Differential/Platelet    Standing Status:   Future    Standing Expiration Date:   04/04/2021  . Comprehensive metabolic panel    Standing Status:   Future    Standing Expiration Date:   04/04/2021    Follow-up in 3 months We spent sufficient time to discuss many aspect of care, questions were answered to patient's satisfaction.  [name], MD, PhD Hematology Oncology [facility] at [facility] Pager- [phone] 04/04/2020

## 2020-04-04 NOTE — Progress Notes (Signed)
Patient here for follow up. No new concerns voiced. Pt on doxycycline for Right leg cellulitis.

## 2020-04-06 DIAGNOSIS — S7002XD Contusion of left hip, subsequent encounter: Secondary | ICD-10-CM | POA: Diagnosis not present

## 2020-04-06 DIAGNOSIS — M25552 Pain in left hip: Secondary | ICD-10-CM | POA: Diagnosis not present

## 2020-04-12 ENCOUNTER — Telehealth: Payer: Self-pay

## 2020-04-12 DIAGNOSIS — I82411 Acute embolism and thrombosis of right femoral vein: Secondary | ICD-10-CM

## 2020-04-12 DIAGNOSIS — Z87898 Personal history of other specified conditions: Secondary | ICD-10-CM

## 2020-04-12 NOTE — Telephone Encounter (Signed)
Patient was calling to see what plan is for her at restarting Eliquis.  Dr. Collie Siad last note states:  If study shows hematoma, patient will remain off Eliquis due to the poor tolerance. I would like to do further blood work including baseline PT and PTT, mixing study if needed.  On 04/06/20 patient was seen by Dr. Candelaria Stagers at [place] for ultrasound-guided aspiration of the soft tissues left hip suspected to be due to hematoma with being on Eliquis.  Advised patient to continue holding the Eliquis per the last office visit note with Dr. Tasia Catchings and I will update MD when she returns to the office on Monday for further planning.

## 2020-04-16 ENCOUNTER — Telehealth (INDEPENDENT_AMBULATORY_CARE_PROVIDER_SITE_OTHER): Payer: Self-pay

## 2020-04-16 NOTE — Telephone Encounter (Signed)
Agree with holding off Eliquis.  Please schedule pt to repeat blood work PT, PTT. Thanks.

## 2020-04-16 NOTE — Telephone Encounter (Signed)
Patient notified.  Will send a scheduling pool message with patient's preference of a morning appt on Th or Fri.

## 2020-04-19 DIAGNOSIS — I480 Paroxysmal atrial fibrillation: Secondary | ICD-10-CM | POA: Diagnosis not present

## 2020-04-19 DIAGNOSIS — R55 Syncope and collapse: Secondary | ICD-10-CM | POA: Diagnosis not present

## 2020-04-19 DIAGNOSIS — E089 Diabetes mellitus due to underlying condition without complications: Secondary | ICD-10-CM | POA: Diagnosis not present

## 2020-04-19 DIAGNOSIS — I10 Essential (primary) hypertension: Secondary | ICD-10-CM | POA: Diagnosis not present

## 2020-04-19 DIAGNOSIS — E7849 Other hyperlipidemia: Secondary | ICD-10-CM | POA: Diagnosis not present

## 2020-04-23 ENCOUNTER — Other Ambulatory Visit: Payer: BC Managed Care – PPO

## 2020-04-24 ENCOUNTER — Inpatient Hospital Stay: Payer: BC Managed Care – PPO

## 2020-04-26 DIAGNOSIS — E1140 Type 2 diabetes mellitus with diabetic neuropathy, unspecified: Secondary | ICD-10-CM | POA: Diagnosis not present

## 2020-04-26 DIAGNOSIS — L03115 Cellulitis of right lower limb: Secondary | ICD-10-CM | POA: Diagnosis not present

## 2020-04-26 DIAGNOSIS — R2681 Unsteadiness on feet: Secondary | ICD-10-CM | POA: Diagnosis not present

## 2020-04-30 ENCOUNTER — Inpatient Hospital Stay: Payer: BC Managed Care – PPO

## 2020-04-30 ENCOUNTER — Other Ambulatory Visit: Payer: Self-pay | Admitting: Oncology

## 2020-05-01 ENCOUNTER — Inpatient Hospital Stay: Payer: BC Managed Care – PPO | Attending: Oncology

## 2020-05-01 ENCOUNTER — Other Ambulatory Visit: Payer: Self-pay

## 2020-05-01 DIAGNOSIS — I82411 Acute embolism and thrombosis of right femoral vein: Secondary | ICD-10-CM

## 2020-05-01 DIAGNOSIS — Z87898 Personal history of other specified conditions: Secondary | ICD-10-CM

## 2020-05-01 LAB — PROTIME-INR
INR: 1.1 (ref 0.8–1.2)
Prothrombin Time: 13.6 seconds (ref 11.4–15.2)

## 2020-05-01 LAB — APTT: aPTT: 27 seconds (ref 24–36)

## 2020-05-03 ENCOUNTER — Ambulatory Visit (INDEPENDENT_AMBULATORY_CARE_PROVIDER_SITE_OTHER): Payer: BC Managed Care – PPO | Admitting: Vascular Surgery

## 2020-05-03 ENCOUNTER — Encounter (INDEPENDENT_AMBULATORY_CARE_PROVIDER_SITE_OTHER): Payer: BC Managed Care – PPO

## 2020-05-06 ENCOUNTER — Other Ambulatory Visit: Payer: Self-pay | Admitting: Urology

## 2020-05-08 ENCOUNTER — Telehealth: Payer: Self-pay

## 2020-05-08 NOTE — Telephone Encounter (Signed)
Patient called for her lab results from 05/01/20.  Also wants to know if she needs to continue taking an Aspirin or Eliquis.

## 2020-05-08 NOTE — Telephone Encounter (Signed)
Normal results on 05/01/2020. No change on her plan. Stay off eliquis due to hematoma. Defer vascular surgeon to see if she will be able to take Aspirin.

## 2020-05-09 NOTE — Telephone Encounter (Signed)
Patient notified

## 2020-05-15 ENCOUNTER — Telehealth (INDEPENDENT_AMBULATORY_CARE_PROVIDER_SITE_OTHER): Payer: Self-pay | Admitting: Vascular Surgery

## 2020-05-15 NOTE — Telephone Encounter (Signed)
Called to ask if she can take [place]. She stated that Dr. Tasia Catchings took her off of the Eliquis an she is no longer on a blood thinner. Dr Collie Siad office also requested that patient call us to confirm it was ok. Patient was last seen 03-23-20, 2 weeks post of Vascular Thrombectomy. Please advise.

## 2020-05-15 NOTE — Telephone Encounter (Signed)
Patient was made aware with medical advice and verbalized understanding 

## 2020-05-15 NOTE — Telephone Encounter (Signed)
That should be fine. 

## 2020-05-22 DIAGNOSIS — L03115 Cellulitis of right lower limb: Secondary | ICD-10-CM | POA: Diagnosis not present

## 2020-05-23 NOTE — Progress Notes (Signed)
05/24/2020 2:23 PM   Becky Trevino 18-May-1946 [id number]  Referring provider: [name], MD [address] [place],  [place] [address]  Chief Complaint  Patient presents with  . Urinary Incontinence    HPI: Patient is a 74 year old female with mixed urinary incontinence who presents today for follow up.     The patient is  experiencing urgency x 4-7 (worsened), frequency x 4-7 (worsened), not restricting fluids to avoid visits to the restroom, is engaging in toilet mapping, incontinence x 0-3 (stable) and nocturia x 4-7 (worsened). Her BP is 130/88.   She feels the Myrbetriq 25 mg daily is not as effective as it has been in the past.  Patient denies any modifying or aggravating factors.  Patient denies any gross hematuria, dysuria or suprapubic/flank pain.  Patient denies any fevers, chills, nausea or vomiting.   She has sleep apnea and is not sleeping with her CPAP machine due to no space in her bedroom.   Last HbA1c 5.1% 01/2020.     PMH: Past Medical History:  Diagnosis Date  . Cervical spondylosis   . Chronic kidney disease    Stage 3  . DDD (degenerative disc disease), cervical    [facility]  . Diabetes mellitus without complication ([place])   . Diverticulosis   . Hyperlipidemia   . Hypertension   . Intertrigo   . Leg cramps   . Leg varices   . Obesity   . OSA (obstructive sleep apnea)   . Symptomatic menopausal or female climacteric states   . Syncope     Surgical History: Past Surgical History:  Procedure Laterality Date  . ABDOMINAL HYSTERECTOMY  1993   Total  . BUNIONECTOMY Bilateral 1993  . I & D KNEE WITH POLY EXCHANGE Right 11/19/2017   Procedure: RIGHT KNEE POLY EXCHANGE WITH IRRIGATION AND DEBRIDEMENT;  Surgeon: Hessie Knows, MD;  Location: [facility];  Service: Orthopedics;  Laterality: Right;  . JOINT REPLACEMENT     bilateral knee  . LAMINECTOMY  11/14/2013    Cervical Fusion , [facility], Dr. Delilah Shan  . LASER ABLATION Bilateral  07/29/2012   Dr. Lucky Cowboy  . LOWER EXTREMITY ANGIOGRAPHY Right 11/02/2019   Procedure: Lower Extremity Angiography;  Surgeon: Katha Cabal, MD;  Location: [facility];  Service: Cardiovascular;  Laterality: Right;  . ORIF ANKLE FRACTURE Right 09/02/2019   Procedure: OPEN REDUCTION INTERNAL FIXATION (ORIF) ANKLE FRACTURE BIMALLEOLAR;  Surgeon: [name], DPM;  Location: [facility];  Service: Podiatry;  Laterality: Right;  . PERIPHERAL VASCULAR THROMBECTOMY Right 03/02/2020   Procedure: PERIPHERAL VASCULAR THROMBECTOMY;  Surgeon: Algernon Huxley, MD;  Location: [facility];  Service: Cardiovascular;  Laterality: Right;  . REPLACEMENT TOTAL KNEE Left 03/2010   Dr. Rudene Christians  . TOTAL HIP ARTHROPLASTY Left 12/11/2015   Procedure: TOTAL HIP ARTHROPLASTY ANTERIOR APPROACH;  Surgeon: Hessie Knows, MD;  Location: [facility];  Service: Orthopedics;  Laterality: Left;  . TOTAL KNEE ARTHROPLASTY Right 03/11/2016   Procedure: TOTAL KNEE ARTHROPLASTY;  Surgeon: Hessie Knows, MD;  Location: [facility];  Service: Orthopedics;  Laterality: Right;    Home Medications:  Allergies as of 05/24/2020      Reactions   Lisinopril Swelling      Medication List       Accurate as of May 24, 2020  2:23 PM. If you have any questions, ask your nurse or doctor.        Accu-Chek Guide test strip Generic drug: glucose blood  CHECK FASTING BLOOD SUGAR ONCE DAILY AND UP TO 4 TIMES DAILY AS DIRECTED   acetaminophen 650 MG CR tablet Commonly known as: TYLENOL Take 650-1,300 mg by mouth 2 (two) times daily as needed for pain.   atorvastatin 40 MG tablet Commonly known as: LIPITOR Take 1 tablet (40 mg total) by mouth every evening.   blood glucose meter kit and supplies Dispense based on patient and insurance preference. Use up to four times daily as directed. (FOR ICD-10 E10.9, E11.9).   Calcium 600-D 600-400 MG-UNIT tablet Generic drug: Calcium Carbonate-Vitamin D Take 1 tablet by mouth 2 (two) times  daily.   doxycycline 100 MG capsule Commonly known as: VIBRAMYCIN Take 100 mg by mouth 2 (two) times daily.   fluticasone 50 MCG/ACT nasal spray Commonly known as: FLONASE Place 2 sprays into both nostrils daily.   furosemide 20 MG tablet Commonly known as: LASIX Take 20 mg by mouth every evening.   HYDROcodone-acetaminophen 5-325 MG tablet Commonly known as: NORCO/VICODIN Take 1 tablet by mouth every 6 (six) hours as needed for moderate pain or severe pain.   imipramine 25 MG tablet Commonly known as: TOFRANIL TAKE 1 TABLET BY MOUTH EVERYDAY AT BEDTIME   ketoconazole 2 % cream Commonly known as: NIZORAL Apply 1 application topically 2 (two) times daily as needed (rash).   Magnesium Oxide 500 MG Caps Take 500 mg by mouth daily.   metoprolol succinate 100 MG 24 hr tablet Commonly known as: TOPROL-XL TAKE 1 TABLET (100 MG TOTAL) BY MOUTH EVERY EVENING. TAKE WITH OR IMMEDIATELY FOLLOWING A MEAL.   midodrine 5 MG tablet Commonly known as: PROAMATINE Take 1 tablet (5 mg total) by mouth 3 (three) times daily with meals. What changed: when to take this   mirabegron ER 50 MG Tb24 tablet Commonly known as: MYRBETRIQ Take 1 tablet (50 mg total) by mouth daily. What changed:   medication strength  how much to take Changed by: Hailee Hollick, PA-C   potassium chloride 10 MEQ tablet Commonly known as: KLOR-CON Take 10 mEq by mouth daily.   Rocklatan 0.02-0.005 % Soln Generic drug: Netarsudil-Latanoprost Place 1 drop into both eyes at bedtime.   Simbrinza 1-0.2 % Susp Generic drug: Brinzolamide-Brimonidine Place 1 drop into both eyes 2 (two) times daily.       Allergies:  Allergies  Allergen Reactions  . Lisinopril Swelling    Family History: Family History  Problem Relation Age of Onset  . Diabetes Mother   . Hyperlipidemia Mother   . Hypertension Mother   . Diabetes Father   . Hyperlipidemia Father   . Hypertension Father   . Obesity Father   .  Hypertension Sister   . Hyperlipidemia Sister   . Hyperlipidemia Sister   . Breast cancer Neg Hx     Social History:  reports that she has never smoked. She has never used smokeless tobacco. She reports that she does not drink alcohol and does not use drugs.  ROS: For pertinent review of systems please refer to history of present illness  Physical Exam: BP 130/88   Pulse 74   Constitutional:  Well nourished. Alert and oriented, No acute distress. HEENT: [place] AT, mask in place.  Trachea midline, no masses. Cardiovascular: No clubbing, cyanosis.  Bilateral lower extremity swelling.  Compression socks in place.   Respiratory: Normal respiratory effort, no increased work of breathing. Neurologic: Grossly intact, confined to wheel chair.  Psychiatric: Normal mood and affect.   Laboratory Data: Lab Results  Component  Value Date   WBC 4.2 04/04/2020   HGB 11.2 (L) 04/04/2020   HCT 34.9 (L) 04/04/2020   MCV 90.2 04/04/2020   PLT 162 04/04/2020    Lab Results  Component Value Date   CREATININE 1.20 (H) 04/04/2020    Lab Results  Component Value Date   HGBA1C 5.1 01/27/2020    Lab Results  Component Value Date   TSH 2.837 08/07/2019       Component Value Date/Time   CHOL 152 01/27/2020 1616   CHOL 166 11/26/2015 1122   HDL 69 01/27/2020 1616   HDL 81 11/26/2015 1122   CHOLHDL 2.2 01/27/2020 1616   VLDL 9 11/06/2016 0913   LDLCALC 64 01/27/2020 1616    Lab Results  Component Value Date   AST 19 04/04/2020   Lab Results  Component Value Date   ALT 13 04/04/2020   I have reviewed the labs.  Pertinent Imaging  Urinary Tract:  Unremarkable decompressed bladder on pelvic CT in 12/2019  Assessment & Plan:    1. Mixed incontinence Worsening Will increase Myrbetriq to 50 mg daily RTC in one month for OAB questionnaire and PVR  2. Nocturia Not currently using CPAP machine  Advised patient to restart her CPAP usage  Return in about 1 month (around 06/23/2020)  for PVR and OAB questionnaire.  These notes generated with voice recognition software. I apologize for typographical errors.  Zara Council, PA-C  [facility] [address] [place], [place] [address] [phone]

## 2020-05-24 ENCOUNTER — Ambulatory Visit (INDEPENDENT_AMBULATORY_CARE_PROVIDER_SITE_OTHER): Payer: BC Managed Care – PPO | Admitting: Urology

## 2020-05-24 ENCOUNTER — Other Ambulatory Visit: Payer: Self-pay

## 2020-05-24 ENCOUNTER — Encounter: Payer: Self-pay | Admitting: Urology

## 2020-05-24 VITALS — BP 130/88 | HR 74

## 2020-05-24 DIAGNOSIS — N3946 Mixed incontinence: Secondary | ICD-10-CM

## 2020-05-24 DIAGNOSIS — R351 Nocturia: Secondary | ICD-10-CM

## 2020-05-24 MED ORDER — MIRABEGRON ER 50 MG PO TB24
50.0000 mg | ORAL_TABLET | Freq: Every day | ORAL | 0 refills | Status: DC
Start: 2020-05-24 — End: 2020-07-06

## 2020-05-25 DIAGNOSIS — I739 Peripheral vascular disease, unspecified: Secondary | ICD-10-CM | POA: Diagnosis not present

## 2020-05-25 DIAGNOSIS — B351 Tinea unguium: Secondary | ICD-10-CM | POA: Diagnosis not present

## 2020-05-27 DIAGNOSIS — R2681 Unsteadiness on feet: Secondary | ICD-10-CM | POA: Diagnosis not present

## 2020-05-27 DIAGNOSIS — E1140 Type 2 diabetes mellitus with diabetic neuropathy, unspecified: Secondary | ICD-10-CM | POA: Diagnosis not present

## 2020-05-27 DIAGNOSIS — L03115 Cellulitis of right lower limb: Secondary | ICD-10-CM | POA: Diagnosis not present

## 2020-05-28 NOTE — Progress Notes (Signed)
Name: Becky Trevino   MRN: [id number]    DOB: 1945/12/03   Date:05/29/2020       Progress Note  Subjective  Chief Complaint  Chief Complaint  Patient presents with   Diabetes   Hypertension   Hyperlipidemia    HPI  DMII with renal manifestation and obesity: she has urinary frequency from urinary incontinence, denies polyphagia or polydipsia. A1C is normal, last urine micro was normal also. GFR dropped and we will continue to monitor   Obesity Morbid: she has been unable to be active, too many orthopedic problems, we will order PT evaluation, she has been able to transfer an use a walker for very short distance inside her house, otherwise using wheelchair. We will refer her to PT   Hypotension:hypertensionresolved, bp has dropped and she is on Midodrine . She is concerned because she is still taking Metoprolol 100 mg we will go down to 50 mg today and if heart rate remains at goal, we will try gradually going down on the dose to be able to stop Midodrine.   History of right DVT of right popliteal vein: diagnosed 10/27/2019, she was on Xarelto but stopped secondary to iliopsoas hematoma, she still has some swelling of right thigh, she has an IV filter , she will follow up with Dr. Payton Mccallum in October, also seen by Dr. Tasia Catchings who advised her to stay blood thinners except for aspirin   Hyperlipidemia: taking Atorvastatin daily and denies side effects. No chest pain , no muscles pain.Reviewed labs with patient   Insomnia/OSA: She has been using a hospital bed and unable to wear CPAP lately, sleeping well and not taking trazodone.  Last Echo done by Dr. Clayborn Bigness showed mild pulmonary hypertension. Unchanged   OA: multi joints, sees Ortho, Dr. Rudene Christians . She had revision of right knee replacement March 2019 because of infection. Still under the care of Ortho now also seeing Dr. Ola Spurr ,no redness on right knee but has swelling on right posterior thigh since admission to [facility],  secondary to DVT    Left femoral nerve palsy:  seen by Dr. Rudene Christians, Dr. Manuella Ghazi, waiting for NCS, unable to walk without assistance, wearing brace because left knee is unstable, she needs assistance bathing, cannot drive, cannot transfer. She was diagnosed with a hematoma of left iliopsoas during hospital stay 10/2019 , but she had repeat  CT pelvis 01/09/2020 that showed resolution of hematoma. She is now off Eliquis and on aspirin only    Patient Active Problem List   Diagnosis Date Noted   History of pelvic hematoma 02/10/2020   Foot ulcer, right, limited to breakdown of skin ([place]) 12/02/2019   Bacteremia due to group B Streptococcus 10/30/2019   Acute deep vein thrombosis (DVT) of right lower extremity ([place]) 10/30/2019   Hematoma    Anemia 08/07/2019   Chronic infection of right knee ([place]) 11/25/2018   Infection and inflammatory reaction due to internal joint prosthesis, subsequent encounter 01/25/2018   Infection of total right knee replacement ([place]) 11/19/2017   Type 2 diabetes mellitus with diabetic neuropathy, without long-term current use of insulin ([place]) 11/06/2016   Primary osteoarthritis of knee 03/11/2016   Primary osteoarthritis of left hip 12/11/2015   Benign essential HTN 04/21/2015   Edema leg 04/21/2015   Chronic kidney disease (CKD), stage III (moderate) 04/21/2015   Chronic venous insufficiency 04/21/2015   DDD (degenerative disc disease), cervical 04/21/2015   Diabetes mellitus with renal manifestation ([place]) 04/21/2015   Dyslipidemia 04/21/2015   Bladder  cystocele 04/21/2015   Urinary incontinence 04/21/2015   Leg varices 04/21/2015   Insomnia 04/21/2015   Eczema intertrigo 04/21/2015   Obstructive apnea 04/21/2015   Menopausal and perimenopausal disorder 04/21/2015   Supraventricular premature beats 04/21/2015   Osteoarthritis of multiple joints 04/21/2015   H/O Spinal surgery 11/14/2013   Detrusor muscle hypertonia 11/07/2013     Past Surgical History:  Procedure Laterality Date   ABDOMINAL HYSTERECTOMY  1993   Total   BUNIONECTOMY Bilateral [place] Right 11/19/2017   Procedure: RIGHT KNEE POLY EXCHANGE WITH IRRIGATION AND DEBRIDEMENT;  Surgeon: Hessie Knows, MD;  Location: [facility];  Service: Orthopedics;  Laterality: Right;   JOINT REPLACEMENT     bilateral knee   LAMINECTOMY  11/14/2013    Cervical Fusion , [facility], Dr. Delilah Shan   LASER ABLATION Bilateral 07/29/2012   Dr. Lucky Cowboy   LOWER EXTREMITY ANGIOGRAPHY Right 11/02/2019   Procedure: Lower Extremity Angiography;  Surgeon: Katha Cabal, MD;  Location: [facility];  Service: Cardiovascular;  Laterality: Right;   ORIF ANKLE FRACTURE Right 09/02/2019   Procedure: OPEN REDUCTION INTERNAL FIXATION (ORIF) ANKLE FRACTURE BIMALLEOLAR;  Surgeon: [name], DPM;  Location: [facility];  Service: Podiatry;  Laterality: Right;   PERIPHERAL VASCULAR THROMBECTOMY Right 03/02/2020   Procedure: PERIPHERAL VASCULAR THROMBECTOMY;  Surgeon: Algernon Huxley, MD;  Location: [facility];  Service: Cardiovascular;  Laterality: Right;   REPLACEMENT TOTAL KNEE Left 03/2010   Dr. Rudene Christians   TOTAL HIP ARTHROPLASTY Left 12/11/2015   Procedure: TOTAL HIP ARTHROPLASTY ANTERIOR APPROACH;  Surgeon: Hessie Knows, MD;  Location: [facility];  Service: Orthopedics;  Laterality: Left;   TOTAL KNEE ARTHROPLASTY Right 03/11/2016   Procedure: TOTAL KNEE ARTHROPLASTY;  Surgeon: Hessie Knows, MD;  Location: [facility];  Service: Orthopedics;  Laterality: Right;    Family History  Problem Relation Age of Onset   Diabetes Mother    Hyperlipidemia Mother    Hypertension Mother    Diabetes Father    Hyperlipidemia Father    Hypertension Father    Obesity Father    Hypertension Sister    Hyperlipidemia Sister    Hyperlipidemia Sister    Breast cancer Neg Hx     Social History   Tobacco Use   Smoking status: Never Smoker    Smokeless tobacco: Never Used  Substance Use Topics   Alcohol use: No    Alcohol/week: 0.0 standard drinks     Current Outpatient Medications:    ACCU-CHEK GUIDE test strip, CHECK FASTING BLOOD SUGAR ONCE DAILY AND UP TO 4 TIMES DAILY AS DIRECTED, Disp: 100 strip, Rfl: 12   acetaminophen (TYLENOL) 650 MG CR tablet, Take 650-1,300 mg by mouth 2 (two) times daily as needed for pain., Disp: , Rfl:    atorvastatin (LIPITOR) 40 MG tablet, Take 1 tablet (40 mg total) by mouth every evening., Disp: 90 tablet, Rfl: 1   blood glucose meter kit and supplies, Dispense based on patient and insurance preference. Use up to four times daily as directed. (FOR ICD-10 E10.9, E11.9)., Disp: 1 each, Rfl: 0   Calcium Carbonate-Vitamin D (CALCIUM 600-D) 600-400 MG-UNIT per tablet, Take 1 tablet by mouth 2 (two) times daily., Disp: , Rfl:    doxycycline (VIBRAMYCIN) 100 MG capsule, Take 100 mg by mouth 2 (two) times daily., Disp: , Rfl:    fluticasone (FLONASE) 50 MCG/ACT nasal spray, Place 2 sprays into both nostrils daily., Disp: 16 g, Rfl: 2  furosemide (LASIX) 20 MG tablet, Take 20 mg by mouth every evening., Disp: , Rfl:    glucose blood test strip, , Disp: , Rfl:    HYDROcodone-acetaminophen (NORCO/VICODIN) 5-325 MG tablet, Take 1 tablet by mouth every 6 (six) hours as needed for moderate pain or severe pain., Disp: 20 tablet, Rfl: 0   imipramine (TOFRANIL) 25 MG tablet, TAKE 1 TABLET BY MOUTH EVERYDAY AT BEDTIME, Disp: 90 tablet, Rfl: 3   ketoconazole (NIZORAL) 2 % cream, Apply 1 application topically 2 (two) times daily as needed (rash). , Disp: , Rfl:    Magnesium Oxide 500 MG CAPS, Take 500 mg by mouth daily. , Disp: , Rfl:    metoprolol succinate (TOPROL-XL) 100 MG 24 hr tablet, TAKE 1 TABLET (100 MG TOTAL) BY MOUTH EVERY EVENING. TAKE WITH OR IMMEDIATELY FOLLOWING A MEAL., Disp: 90 tablet, Rfl: 1   midodrine (PROAMATINE) 5 MG tablet, Take 1 tablet (5 mg total) by mouth 3 (three) times  daily with meals. (Patient taking differently: Take 5 mg by mouth 3 (three) times daily. ), Disp: 90 tablet, Rfl: 0   mirabegron ER (MYRBETRIQ) 50 MG TB24 tablet, Take 1 tablet (50 mg total) by mouth daily., Disp: 28 tablet, Rfl: 0   Netarsudil-Latanoprost (ROCKLATAN) 0.02-0.005 % SOLN, Place 1 drop into both eyes at bedtime., Disp: , Rfl:    potassium chloride (KLOR-CON) 10 MEQ tablet, Take 10 mEq by mouth daily., Disp: , Rfl:    SIMBRINZA 1-0.2 % SUSP, Place 1 drop into both eyes 2 (two) times daily., Disp: , Rfl:   Allergies  Allergen Reactions   Lisinopril Swelling    I personally reviewed active problem list, medication list, allergies, family history, social history, health maintenance with the patient/caregiver today.   ROS  Constitutional: Negative for fever or weight change.  Respiratory: Negative for cough and shortness of breath.   Cardiovascular: Negative for chest pain or palpitations.  Gastrointestinal: Negative for abdominal pain, no bowel changes.  Musculoskeletal: positive for  for gait problem and knee  joint swelling.  Skin: Negative for rash.  Neurological: Negative for dizziness or headache.  No other specific complaints in a complete review of systems (except as listed in HPI above).  Objective  Vitals:   05/29/20 0953  BP: 100/60  Pulse: 73  Resp: 16  Temp: 98 F (36.7 C)  TempSrc: Oral  SpO2: 100%  Height: 5' 11" (1.803 m)    Body mass index is 35.15 kg/m.  Physical Exam  Constitutional: Patient appears well-developed and well-nourished. Obese  No distress.  HEENT: head atraumatic, normocephalic, pupils equal and reactive to light,  neck supple Cardiovascular: Normal rate, regular rhythm and normal heart sounds.  No murmur heard. BLE edema - on thighs, right more than left  Pulmonary/Chest: Effort normal and breath sounds normal. No respiratory distress. Abdominal: Soft.  There is no tenderness. Psychiatric: Patient has a normal mood and  affect. behavior is normal. Judgment and thought content normal.  Recent Results (from the past 2160 hour(s))  SARS CORONAVIRUS 2 (TAT 6-24 HRS) Nasopharyngeal Nasopharyngeal Swab     Status: None   Collection Time: 02/29/20  2:26 PM   Specimen: Nasopharyngeal Swab  Result Value Ref Range   SARS Coronavirus 2 NEGATIVE NEGATIVE    Comment: (NOTE) SARS-CoV-2 target nucleic acids are NOT DETECTED.  The SARS-CoV-2 RNA is generally detectable in upper and lower respiratory specimens during the acute phase of infection. Negative results do not preclude SARS-CoV-2 infection, do not rule out  co-infections with other pathogens, and should not be used as the sole basis for treatment or other patient management decisions. Negative results must be combined with clinical observations, patient history, and epidemiological information. The expected result is Negative.  Fact Sheet for Patients: SugarRoll.be  Fact Sheet for Healthcare Providers: https://www.woods-mathews.com/  This test is not yet approved or cleared by the [place] FDA and  has been authorized for detection and/or diagnosis of SARS-CoV-2 by FDA under an Emergency Use Authorization (EUA). This EUA will remain  in effect (meaning this test can be used) for the duration of the COVID-19 declaration under Se ction 564(b)(1) of the Act, 21 U.S.C. section 360bbb-3(b)(1), unless the authorization is terminated or revoked sooner.  Performed at [facility], [place] [address] [place], [place] [address]   BUN     Status: None   Collection Time: 03/02/20  1:00 PM  Result Value Ref Range   BUN 19 8 - 23 mg/dL    Comment: Performed at [facility], [address] [place], [place] [address]  Creatinine, serum     Status: Abnormal   Collection Time: 03/02/20  1:00 PM  Result Value Ref Range   Creatinine, Ser 1.06 (H) 0.44 - 1.00 mg/dL   GFR calc non Af Amer 52 (L) >60 mL/min    GFR calc Af Amer 60 (L) >60 mL/min    Comment: Performed at [facility], [address] [place], [place] [address]  Comprehensive metabolic panel     Status: Abnormal   Collection Time: 04/04/20  9:35 AM  Result Value Ref Range   Sodium 139 135 - 145 mmol/L   Potassium 4.2 3.5 - 5.1 mmol/L   Chloride 103 98 - 111 mmol/L   CO2 28 22 - 32 mmol/L   Glucose, Bld 97 70 - 99 mg/dL    Comment: Glucose reference range applies only to samples taken after fasting for at least 8 hours.   BUN 19 8 - 23 mg/dL   Creatinine, Ser 1.20 (H) 0.44 - 1.00 mg/dL   Calcium 9.0 8.9 - 10.3 mg/dL   Total Protein 7.8 6.5 - 8.1 g/dL   Albumin 3.4 (L) 3.5 - 5.0 g/dL   AST 19 15 - 41 U/L   ALT 13 0 - 44 U/L   Alkaline Phosphatase 106 38 - 126 U/L   Total Bilirubin 0.5 0.3 - 1.2 mg/dL   GFR calc non Af Amer 44 (L) >60 mL/min   GFR calc Af Amer 52 (L) >60 mL/min   Anion gap 8 5 - 15    Comment: Performed at [facility], [address] [place],  [address]  CBC with Differential/Platelet     Status: Abnormal   Collection Time: 04/04/20  9:35 AM  Result Value Ref Range   WBC 4.2 4.0 - 10.5 K/uL   RBC 3.87 3.87 - 5.11 MIL/uL   Hemoglobin 11.2 (L) 12.0 - 15.0 g/dL   HCT 34.9 (L) 36 - 46 %   MCV 90.2 80.0 - 100.0 fL   MCH 28.9 26.0 - 34.0 pg   MCHC 32.1 30.0 - 36.0 g/dL   RDW 18.5 (H) 11.5 - 15.5 %   Platelets 162 150 - 400 K/uL   nRBC 0.0 0.0 - 0.2 %   Neutrophils Relative % 63 %   Neutro Abs 2.7 1.7 - 7.7 K/uL   Lymphocytes Relative 24 %   Lymphs Abs 1.0 0.7 - 4.0 K/uL   Monocytes Relative  9 %   Monocytes Absolute 0.4 0 - 1 K/uL   Eosinophils Relative 3 %   Eosinophils Absolute 0.1 0 - 0 K/uL   Basophils Relative 1 %   Basophils Absolute 0.0 0 - 0 K/uL   Immature Granulocytes 0 %   Abs Immature Granulocytes 0.01 0.00 - 0.07 K/uL    Comment: Performed at [facility], [address] [place], [place] [address]  APTT     Status: None   Collection Time: 05/01/20  11:03 AM  Result Value Ref Range   aPTT 27 24 - 36 seconds    Comment: Performed at [facility], [address] [place], [place] [address]  Protime-INR     Status: None   Collection Time: 05/01/20 11:03 AM  Result Value Ref Range   Prothrombin Time 13.6 11.4 - 15.2 seconds   INR 1.1 0.8 - 1.2    Comment: (NOTE) INR goal varies based on device and disease states. Performed at [facility], [place], [place] [address]   POCT HgB A1C     Status: Normal   Collection Time: 05/29/20  9:56 AM  Result Value Ref Range   Hemoglobin A1C 5.5 4.0 - 5.6 %   HbA1c POC (<> result, manual entry)     HbA1c, POC (prediabetic range)     HbA1c, POC (controlled diabetic range)      PHQ2/9: Depression screen [facility] 2/9 01/27/2020 09/23/2019 08/23/2019 05/12/2019 11/25/2018  Decreased Interest 0 0 0 0 0  Down, Depressed, Hopeless 0 0 0 0 0  PHQ - 2 Score 0 0 0 0 0  Altered sleeping 0 0 0 0 0  Tired, decreased energy 0 0 0 0 0  Change in appetite 0 0 0 0 0  Feeling bad or failure about yourself  0 0 0 0 0  Trouble concentrating 0 0 0 0 0  Moving slowly or fidgety/restless 0 0 0 0 0  Suicidal thoughts 0 0 0 0 -  PHQ-9 Score 0 0 0 0 0  Difficult doing work/chores - - - Not difficult at all -    phq 9 is negative   Fall Risk: Fall Risk  05/29/2020 01/27/2020 09/23/2019 08/23/2019 05/12/2019  Falls in the past year? 0 0 1 0 0  Number falls in past yr: 0 0 0 0 0  Comment - - - - -  Injury with Fall? 0 0 1 0 0  Comment - - 07/2019 hematoma - -  Follow up - - - - Falls evaluation completed     Functional Status Survey: Is the patient deaf or have difficulty hearing?: No Does the patient have difficulty seeing, even when wearing glasses/contacts?: No Does the patient have difficulty concentrating, remembering, or making decisions?: No Does the patient have difficulty walking or climbing stairs?: Yes Does the patient have difficulty dressing or bathing?: No Does the  patient have difficulty doing errands alone such as visiting a doctor's office or shopping?: Yes    Assessment & Plan   1. Type 2 diabetes mellitus with diabetic neuropathy, without long-term current use of insulin (HCC)  - POCT HgB A1C  2. Need for immunization against influenza  - Flu Vaccine QUAD High Dose(Fluad)  3. Dyslipidemia  - atorvastatin (LIPITOR) 40 MG tablet; Take 1 tablet (40 mg total) by mouth every evening.  Dispense: 90 tablet; Refill: 1  4. Lower leg DVT (deep venous thromboembolism), chronic, right ([place])  - Ambulatory referral to Home  Health  5. Pulmonary hypertension ([place])  She has a CPAP machine but not able to use because she is not sleeping in her bed yet, using a hospital bed, advised to try setting it up near her bed.   6. Stage 3 chronic kidney disease, unspecified whether stage 3a or 3b CKD   7. Chronic infection of right knee ([place])  - Ambulatory referral to Home Health  8. Gait difficulty  - Ambulatory referral to Home Health  9. History of bilateral knee replacement  - Ambulatory referral to Home Health  10. Essential (primary) hypertension  - metoprolol succinate (TOPROL-XL) 50 MG 24 hr tablet; Take 1 tablet (50 mg total) by mouth daily. Take with or immediately following a meal.  Dispense: 90 tablet; Refill: 0

## 2020-05-29 ENCOUNTER — Other Ambulatory Visit: Payer: Self-pay

## 2020-05-29 ENCOUNTER — Ambulatory Visit (INDEPENDENT_AMBULATORY_CARE_PROVIDER_SITE_OTHER): Payer: BC Managed Care – PPO | Admitting: Family Medicine

## 2020-05-29 ENCOUNTER — Encounter: Payer: Self-pay | Admitting: Family Medicine

## 2020-05-29 VITALS — BP 100/60 | HR 73 | Temp 98.0°F | Resp 16 | Ht 71.0 in

## 2020-05-29 DIAGNOSIS — E1140 Type 2 diabetes mellitus with diabetic neuropathy, unspecified: Secondary | ICD-10-CM | POA: Diagnosis not present

## 2020-05-29 DIAGNOSIS — M009 Pyogenic arthritis, unspecified: Secondary | ICD-10-CM

## 2020-05-29 DIAGNOSIS — I10 Essential (primary) hypertension: Secondary | ICD-10-CM

## 2020-05-29 DIAGNOSIS — R269 Unspecified abnormalities of gait and mobility: Secondary | ICD-10-CM

## 2020-05-29 DIAGNOSIS — I825Z1 Chronic embolism and thrombosis of unspecified deep veins of right distal lower extremity: Secondary | ICD-10-CM | POA: Diagnosis not present

## 2020-05-29 DIAGNOSIS — E785 Hyperlipidemia, unspecified: Secondary | ICD-10-CM

## 2020-05-29 DIAGNOSIS — N183 Chronic kidney disease, stage 3 unspecified: Secondary | ICD-10-CM

## 2020-05-29 DIAGNOSIS — Z96653 Presence of artificial knee joint, bilateral: Secondary | ICD-10-CM

## 2020-05-29 DIAGNOSIS — I272 Pulmonary hypertension, unspecified: Secondary | ICD-10-CM | POA: Diagnosis not present

## 2020-05-29 DIAGNOSIS — Z23 Encounter for immunization: Secondary | ICD-10-CM | POA: Diagnosis not present

## 2020-05-29 LAB — POCT GLYCOSYLATED HEMOGLOBIN (HGB A1C): Hemoglobin A1C: 5.5 % (ref 4.0–5.6)

## 2020-05-29 MED ORDER — METOPROLOL SUCCINATE ER 50 MG PO TB24: 50.0000 mg | ORAL_TABLET | Freq: Every day | ORAL | 0 refills | Status: DC

## 2020-05-29 MED ORDER — ASPIRIN EC 81 MG PO TBEC: 81.0000 mg | DELAYED_RELEASE_TABLET | Freq: Every day | ORAL | 0 refills | Status: DC

## 2020-05-29 MED ORDER — ATORVASTATIN CALCIUM 40 MG PO TABS: 40.0000 mg | ORAL_TABLET | Freq: Every evening | ORAL | 1 refills | Status: DC

## 2020-06-04 ENCOUNTER — Telehealth: Payer: Self-pay

## 2020-06-04 NOTE — Telephone Encounter (Signed)
Patient called stating that she is having increased urinary frequency and believes she may have a UTI. Patient was offered a visit today for further evaluation. She states she is unable to come today due to transportation issues. Patient was made an apt for Thursday and will call back if transportation cannot be arranged for this visit

## 2020-06-06 ENCOUNTER — Ambulatory Visit: Payer: BC Managed Care – PPO | Admitting: Urology

## 2020-06-07 ENCOUNTER — Ambulatory Visit: Payer: Self-pay | Admitting: Urology

## 2020-06-12 ENCOUNTER — Ambulatory Visit: Payer: Self-pay | Admitting: Urology

## 2020-06-18 DIAGNOSIS — G8929 Other chronic pain: Secondary | ICD-10-CM | POA: Diagnosis not present

## 2020-06-18 DIAGNOSIS — M25552 Pain in left hip: Secondary | ICD-10-CM | POA: Diagnosis not present

## 2020-06-18 DIAGNOSIS — G5722 Lesion of femoral nerve, left lower limb: Secondary | ICD-10-CM | POA: Diagnosis not present

## 2020-06-18 DIAGNOSIS — Z96642 Presence of left artificial hip joint: Secondary | ICD-10-CM | POA: Diagnosis not present

## 2020-06-18 DIAGNOSIS — S7002XD Contusion of left hip, subsequent encounter: Secondary | ICD-10-CM | POA: Diagnosis not present

## 2020-06-21 DIAGNOSIS — L03115 Cellulitis of right lower limb: Secondary | ICD-10-CM | POA: Diagnosis not present

## 2020-06-24 ENCOUNTER — Other Ambulatory Visit: Payer: Self-pay | Admitting: Family Medicine

## 2020-06-24 NOTE — Telephone Encounter (Signed)
Requested Prescriptions  Pending Prescriptions Disp Refills  . ASPIRIN LOW DOSE 81 MG EC tablet [Pharmacy Med Name: ASPIRIN EC 81 MG TABLET] 30 tablet 0    Sig: TAKE 1 TABLET BY MOUTH EVERY DAY     Analgesics:  NSAIDS - aspirin Passed - 06/24/2020  2:30 PM      Passed - Patient is not pregnant      Passed - Valid encounter within last 12 months    Recent Outpatient Visits          3 weeks ago Type 2 diabetes mellitus with diabetic neuropathy, without long-term current use of insulin [facility])   [facility], Drue Stager, MD   4 months ago Type 2 diabetes mellitus with diabetic neuropathy, without long-term current use of insulin [facility])   [facility], Drue Stager, MD   9 months ago Chronic infection of right knee [facility])   [facility] [name], MD   10 months ago Hospital discharge follow-up   [facility] [name], MD   1 year ago Type 2 diabetes mellitus with diabetic neuropathy, without long-term current use of insulin [facility])   [facility] [name], MD      Future Appointments            In 4 days [name], [name]   In 2 months [name], MD [name], [name]

## 2020-06-25 ENCOUNTER — Encounter (INDEPENDENT_AMBULATORY_CARE_PROVIDER_SITE_OTHER): Payer: Self-pay | Admitting: Vascular Surgery

## 2020-06-25 ENCOUNTER — Ambulatory Visit (INDEPENDENT_AMBULATORY_CARE_PROVIDER_SITE_OTHER): Payer: BC Managed Care – PPO | Admitting: Vascular Surgery

## 2020-06-25 ENCOUNTER — Other Ambulatory Visit: Payer: Self-pay

## 2020-06-25 VITALS — BP 133/77 | HR 74 | Resp 17 | Ht 71.0 in | Wt 270.0 lb

## 2020-06-25 DIAGNOSIS — I87099 Postthrombotic syndrome with other complications of unspecified lower extremity: Secondary | ICD-10-CM

## 2020-06-25 DIAGNOSIS — Z86718 Personal history of other venous thrombosis and embolism: Secondary | ICD-10-CM | POA: Insufficient documentation

## 2020-06-25 DIAGNOSIS — M159 Polyosteoarthritis, unspecified: Secondary | ICD-10-CM

## 2020-06-25 DIAGNOSIS — I872 Venous insufficiency (chronic) (peripheral): Secondary | ICD-10-CM | POA: Diagnosis not present

## 2020-06-25 DIAGNOSIS — I890 Lymphedema, not elsewhere classified: Secondary | ICD-10-CM | POA: Diagnosis not present

## 2020-06-25 DIAGNOSIS — I10 Essential (primary) hypertension: Secondary | ICD-10-CM

## 2020-06-25 DIAGNOSIS — M8949 Other hypertrophic osteoarthropathy, multiple sites: Secondary | ICD-10-CM

## 2020-06-25 DIAGNOSIS — E1140 Type 2 diabetes mellitus with diabetic neuropathy, unspecified: Secondary | ICD-10-CM

## 2020-06-25 NOTE — Progress Notes (Signed)
MRN : [id number]  Becky Trevino is a 74 y.o. ([birth year]) female who presents with chief complaint of  Chief Complaint  Patient presents with  . Follow-up    3 mo no studies  .  History of Present Illness:   The patient returns to the office for followup evaluation regarding leg swelling.  The swelling has persisted but with the lymph pump is much, much better controlled. The pain associated with swelling is essentially eliminated. There have not been any interval development of a ulcerations or wounds.  The patient denies problems with the pump, noting it is working well and the leggings are in good condition.  Since the previous visit the patient has been wearing graduated compression stockings and using the lymph pump on a routine basis and  has noted significant improvement in the lymphedema.   Patient stated the lymph pump has been a very positive factor in her care.   With respect to her history of DVT: Several months ago Dr. Lucky Cowboy attempted to cross the chronic right iliac vein occlusion.  He was not able to do so.  At this point she is off anticoagulation.  She is in the process of being evaluated at [facility] regarding her leg pain and the potential for further orthopedic surgery.    Current Meds  Medication Sig  . ACCU-CHEK GUIDE test strip CHECK FASTING BLOOD SUGAR ONCE DAILY AND UP TO 4 TIMES DAILY AS DIRECTED  . acetaminophen (TYLENOL) 650 MG CR tablet Take 650-1,300 mg by mouth 2 (two) times daily as needed for pain.  . ASPIRIN LOW DOSE 81 MG EC tablet TAKE 1 TABLET BY MOUTH EVERY DAY  . atorvastatin (LIPITOR) 40 MG tablet Take 1 tablet (40 mg total) by mouth every evening.  . blood glucose meter kit and supplies Dispense based on patient and insurance preference. Use up to four times daily as directed. (FOR ICD-10 E10.9, E11.9).  . Calcium Carbonate-Vitamin D (CALCIUM 600-D) 600-400 MG-UNIT per tablet Take 1 tablet by mouth 2 (two) times daily.  Marland Kitchen doxycycline (VIBRAMYCIN)  100 MG capsule Take 100 mg by mouth 2 (two) times daily.  Marland Kitchen glucose blood test strip   . HYDROcodone-acetaminophen (NORCO/VICODIN) 5-325 MG tablet Take 1 tablet by mouth every 6 (six) hours as needed for moderate pain or severe pain.  Marland Kitchen imipramine (TOFRANIL) 25 MG tablet TAKE 1 TABLET BY MOUTH EVERYDAY AT BEDTIME  . ketoconazole (NIZORAL) 2 % cream Apply 1 application topically 2 (two) times daily as needed (rash).   . Magnesium Oxide 500 MG CAPS Take 500 mg by mouth daily.   . metoprolol succinate (TOPROL-XL) 50 MG 24 hr tablet Take 1 tablet (50 mg total) by mouth daily. Take with or immediately following a meal.  . midodrine (PROAMATINE) 5 MG tablet Take 1 tablet (5 mg total) by mouth 3 (three) times daily with meals. (Patient taking differently: Take 5 mg by mouth 3 (three) times daily. )  . mirabegron ER (MYRBETRIQ) 50 MG TB24 tablet Take 1 tablet (50 mg total) by mouth daily.  . Netarsudil-Latanoprost (ROCKLATAN) 0.02-0.005 % SOLN Place 1 drop into both eyes at bedtime.  . potassium chloride (KLOR-CON) 10 MEQ tablet Take 10 mEq by mouth daily.  Marland Kitchen SIMBRINZA 1-0.2 % SUSP Place 1 drop into both eyes 2 (two) times daily.    Past Medical History:  Diagnosis Date  . Cervical spondylosis   . Chronic kidney disease    Stage 3  . DDD (degenerative disc disease),  cervical    [facility]  . Diabetes mellitus without complication ([place])   . Diverticulosis   . Hyperlipidemia   . Hypertension   . Intertrigo   . Leg cramps   . Leg varices   . Obesity   . OSA (obstructive sleep apnea)   . Symptomatic menopausal or female climacteric states   . Syncope     Past Surgical History:  Procedure Laterality Date  . ABDOMINAL HYSTERECTOMY  1993   Total  . BUNIONECTOMY Bilateral 1993  . I & D KNEE WITH POLY EXCHANGE Right 11/19/2017   Procedure: RIGHT KNEE POLY EXCHANGE WITH IRRIGATION AND DEBRIDEMENT;  Surgeon: Hessie Knows, MD;  Location: [facility];  Service: Orthopedics;  Laterality: Right;    . JOINT REPLACEMENT     bilateral knee  . LAMINECTOMY  11/14/2013    Cervical Fusion , [facility], Dr. Delilah Shan  . LASER ABLATION Bilateral 07/29/2012   Dr. Lucky Cowboy  . LOWER EXTREMITY ANGIOGRAPHY Right 11/02/2019   Procedure: Lower Extremity Angiography;  Surgeon: Katha Cabal, MD;  Location: [place];  Service: Cardiovascular;  Laterality: Right;  . ORIF ANKLE FRACTURE Right 09/02/2019   Procedure: OPEN REDUCTION INTERNAL FIXATION (ORIF) ANKLE FRACTURE BIMALLEOLAR;  Surgeon: [name], DPM;  Location: [facility];  Service: Podiatry;  Laterality: Right;  . PERIPHERAL VASCULAR THROMBECTOMY Right 03/02/2020   Procedure: PERIPHERAL VASCULAR THROMBECTOMY;  Surgeon: Algernon Huxley, MD;  Location: [facility] [facility];  Service: Cardiovascular;  Laterality: Right;  . REPLACEMENT TOTAL KNEE Left 03/2010   Dr. Rudene Christians  . TOTAL HIP ARTHROPLASTY Left 12/11/2015   Procedure: TOTAL HIP ARTHROPLASTY ANTERIOR APPROACH;  Surgeon: Hessie Knows, MD;  Location: [facility];  Service: Orthopedics;  Laterality: Left;  . TOTAL KNEE ARTHROPLASTY Right 03/11/2016   Procedure: TOTAL KNEE ARTHROPLASTY;  Surgeon: Hessie Knows, MD;  Location: [facility];  Service: Orthopedics;  Laterality: Right;    Social History Social History   Tobacco Use  . Smoking status: Never Smoker  . Smokeless tobacco: Never Used  Vaping Use  . Vaping Use: Never used  Substance Use Topics  . Alcohol use: No    Alcohol/week: 0.0 standard drinks  . Drug use: No    Family History Family History  Problem Relation Age of Onset  . Diabetes Mother   . Hyperlipidemia Mother   . Hypertension Mother   . Diabetes Father   . Hyperlipidemia Father   . Hypertension Father   . Obesity Father   . Hypertension Sister   . Hyperlipidemia Sister   . Hyperlipidemia Sister   . Breast cancer Neg Hx     Allergies  Allergen Reactions  . Lisinopril Swelling     REVIEW OF SYSTEMS (Negative unless checked)  Constitutional: _0 Weight loss   _1 Fever  _2 Chills Cardiac: _3 Chest pain   _4 Chest pressure   _5 Palpitations   _6 Shortness of breath when laying flat   _7 Shortness of breath with exertion. Vascular:  _8 Pain in legs with walking   _9 Pain in legs at rest  _10 History of DVT   _11 Phlebitis   _12 Swelling in legs   _13 Varicose veins   _14 Non-healing ulcers Pulmonary:   _15 Uses home oxygen   _16 Productive cough   _17 Hemoptysis   _18 Wheeze  _19 COPD   _20 Asthma Neurologic:  _21 Dizziness   _22 Seizures   _23 History of stroke   _24 History of TIA  _25 Aphasia   _26 Vissual changes   _27 Weakness or numbness in arm   _28 Weakness or numbness in leg Musculoskeletal:   _29 Joint swelling   [  x]Joint pain   _0 Low back pain Hematologic:  _1 Easy bruising  _2 Easy bleeding   _3 Hypercoagulable state   _4 Anemic Gastrointestinal:  _5 Diarrhea   _6 Vomiting  _7 Gastroesophageal reflux/heartburn   _8 Difficulty swallowing. Genitourinary:  _9 Chronic kidney disease   _10 Difficult urination  _11 Frequent urination   _12 Blood in urine Skin:  _13 Rashes   _14 Ulcers  Psychological:  _15 History of anxiety   _16  History of major depression.  Physical Examination  Vitals:   06/25/20 0907  BP: 133/77  Pulse: 74  Resp: 17  Weight: 270 lb (122.5 kg)  Height: _17  (1.803 m)   Body mass index is 37.66 kg/m. Gen: WD/WN, NAD Head: [place]/AT, No temporalis wasting.  Ear/Nose/Throat: Hearing grossly intact, nares w/o erythema or drainage Eyes: PER, EOMI, sclera nonicteric.  Neck: Supple, no large masses.   Pulmonary:  Good air movement, no audible wheezing bilaterally, no use of accessory muscles.  Cardiac: RRR, no JVD Vascular: scattered varicosities present bilaterally.  Moderate venous stasis changes to the legs bilaterally.  2-3+ soft pitting edema Vessel Right Left  Radial Palpable Palpable  Gastrointestinal: Non-distended. No guarding/no peritoneal signs.  Musculoskeletal: M/S 5/5 throughout.  DJD deformity of multiple joint with atrophy.  Neurologic: CN 2-12 intact. Symmetrical.  Speech  is fluent. Motor exam as listed above. Psychiatric: Judgment intact, Mood & affect appropriate for pt's clinical situation. Dermatologic: Venous rashes no ulcers noted.  No changes consistent with cellulitis. Lymph : Mild lichenification and skin changes of chronic lymphedema.  CBC Lab Results  Component Value Date   WBC 4.2 04/04/2020   HGB 11.2 (L) 04/04/2020   HCT 34.9 (L) 04/04/2020   MCV 90.2 04/04/2020   PLT 162 04/04/2020    BMET    Component Value Date/Time   NA 139 04/04/2020 0935   NA 142 11/26/2015 1122   NA 141 10/18/2012 1716   K 4.2 04/04/2020 0935   K 3.5 10/18/2012 1716   CL 103 04/04/2020 0935   CL 110 (H) 10/18/2012 1716   CO2 28 04/04/2020 0935   CO2 24 10/18/2012 1716   GLUCOSE 97 04/04/2020 0935   GLUCOSE 128 (H) 10/18/2012 1716   BUN 19 04/04/2020 0935   BUN 27 11/26/2015 1122   BUN 31 (H) 10/18/2012 1716   CREATININE 1.20 (H) 04/04/2020 0935   CREATININE 1.06 (H) 01/27/2020 1616   CALCIUM 9.0 04/04/2020 0935   CALCIUM 7.9 (L) 10/18/2012 1716   GFRNONAA 44 (L) 04/04/2020 0935   GFRNONAA 52 (L) 01/27/2020 1616   GFRAA 52 (L) 04/04/2020 0935   GFRAA 60 01/27/2020 1616   CrCl cannot be calculated (Patient's most recent lab result is older than the maximum 21 days allowed.).  COAG Lab Results  Component Value Date   INR 1.1 05/01/2020   INR 1.1 11/02/2019   INR 1.2 10/28/2019    Radiology No results found.   Assessment/Plan 1. History of DVT (deep vein thrombosis) Recommend:   No surgery or intervention at this point in time.  IVC filter is in place and she is still being evaluated for further joint replacement surgery.  Given the situation the patient does not want to have her IVC filter removed and I concur with this decision.  Patient's duplex ultrasound of the venous system shows chronic DVT from the popliteal to the femoral veins.  Elevation was stressed, use of a recliner was discussed.  I have had a long discussion with the  patient regarding DVT and post phlebitic changes such as swelling and why it  causes symptoms such as pain.  The patient will wear graduated compression stockings, on a daily basis a prescription was given. The patient will  beginning wearing the stockings first thing in the morning and removing them in the evening. The patient is instructed specifically not to sleep in the stockings.  In addition, behavioral modification including elevation during the day and avoidance of prolonged dependency will be initiated.    The patient will continue anticoagulation for now as there have not been any problems or complications at this point.   - VAS US AORTA/IVC/ILIACS; Future  2. Chronic venous hypertension due to deep vein thrombosis (DVT) See #1  3. Lymphedema  No surgery or intervention at this point in time.    I have reviewed my discussion with the patient regarding lymphedema and why it  causes symptoms.  Patient will continue wearing graduated compression stockings class 1 (20-30 mmHg) on a daily basis a prescription was given. The patient is reminded to put the stockings on first thing in the morning and removing them in the evening. The patient is instructed specifically not to sleep in the stockings.   In addition, behavioral modification throughout the day will be continued.  This will include frequent elevation (such as in a recliner), use of over the counter pain medications as needed and exercise such as walking.  I have reviewed systemic causes for chronic edema such as liver, kidney and cardiac etiologies and there does not appear to be any significant changes in these organ systems over the past year.  The patient is under the impression that these organ systems are all stable and unchanged.    The patient will continue aggressive use of the  lymph pump.  This will continue to improve the edema control and prevent sequela such as ulcers and infections.   The patient will follow-up with me  on an annual basis.    4. Chronic venous insufficiency See #3  5. Benign essential HTN Continue antihypertensive medications as already ordered, these medications have been reviewed and there are no changes at this time.   6. Primary osteoarthritis involving multiple joints Continue NSAID medications as already ordered, these medications have been reviewed and there are no changes at this time.  Continued activity and therapy was stressed.   7. Type 2 diabetes mellitus with diabetic neuropathy, without long-term current use of insulin ([place]) Continue hypoglycemic medications as already ordered, these medications have been reviewed and there are no changes at this time.  Hgb A1C to be monitored as already arranged by primary service    Hortencia Pilar, MD  06/25/2020 9:40 AM

## 2020-06-26 DIAGNOSIS — R2681 Unsteadiness on feet: Secondary | ICD-10-CM | POA: Diagnosis not present

## 2020-06-26 DIAGNOSIS — E1140 Type 2 diabetes mellitus with diabetic neuropathy, unspecified: Secondary | ICD-10-CM | POA: Diagnosis not present

## 2020-06-26 DIAGNOSIS — L03115 Cellulitis of right lower limb: Secondary | ICD-10-CM | POA: Diagnosis not present

## 2020-06-27 NOTE — Progress Notes (Signed)
06/28/2020 4:37 PM   Becky Trevino 04-23-1946 [id number]  Referring provider: Alba Cory, MD [address] [place],  [place] [address]  Chief Complaint  Patient presents with  . Urinary Incontinence    HPI: Patient is a 74 year old female with mixed urinary incontinence who presents today for follow up with a friend of her daughter.   The patient is  experiencing urgency x 0-3 (improved), frequency x 0-3 (improved), is restricting fluids to avoid visits to the restroom, is engaging in toilet mapping, incontinence x 0-3 (unchanged) and nocturia x 0-3 (improved). Her BP is 140/80.    The increase in Myrbetriq to 50 mg was found to be helpful.  She is not getting up at night as much in the daytime frequency has decreased as well.  She has sleep apnea and is still not sleeping with her CPAP machine due to no space in her bedroom.   Last HbA1c 5.1% 01/2020.     PMH: Past Medical History:  Diagnosis Date  . Cervical spondylosis   . Chronic kidney disease    Stage 3  . DDD (degenerative disc disease), cervical    [facility]  . Diabetes mellitus without complication ([facility])   . Diverticulosis   . Hyperlipidemia   . Hypertension   . Intertrigo   . Leg cramps   . Leg varices   . Obesity   . OSA (obstructive sleep apnea)   . Symptomatic menopausal or female climacteric states   . Syncope     Surgical History: Past Surgical History:  Procedure Laterality Date  . ABDOMINAL HYSTERECTOMY  1993   Total  . BUNIONECTOMY Bilateral 1993  . I & D KNEE WITH POLY EXCHANGE Right 11/19/2017   Procedure: RIGHT KNEE POLY EXCHANGE WITH IRRIGATION AND DEBRIDEMENT;  Surgeon: Kennedy Bucker, MD;  Location: [facility];  Service: Orthopedics;  Laterality: Right;  . JOINT REPLACEMENT     bilateral knee  . LAMINECTOMY  11/14/2013    Cervical Fusion , [facility], Dr. Timoteo Ace  . LASER ABLATION Bilateral 07/29/2012   Dr. Wyn Quaker  . LOWER EXTREMITY ANGIOGRAPHY Right 11/02/2019    Procedure: Lower Extremity Angiography;  Surgeon: Renford Dills, MD;  Location: [facility];  Service: Cardiovascular;  Laterality: Right;  . ORIF ANKLE FRACTURE Right 09/02/2019   Procedure: OPEN REDUCTION INTERNAL FIXATION (ORIF) ANKLE FRACTURE BIMALLEOLAR;  Surgeon: Rosetta Posner, DPM;  Location: [facility];  Service: Podiatry;  Laterality: Right;  . PERIPHERAL VASCULAR THROMBECTOMY Right 03/02/2020   Procedure: PERIPHERAL VASCULAR THROMBECTOMY;  Surgeon: Annice Needy, MD;  Location: [facility];  Service: Cardiovascular;  Laterality: Right;  . REPLACEMENT TOTAL KNEE Left 03/2010   Dr. Rosita Kea  . TOTAL HIP ARTHROPLASTY Left 12/11/2015   Procedure: TOTAL HIP ARTHROPLASTY ANTERIOR APPROACH;  Surgeon: Kennedy Bucker, MD;  Location: [facility];  Service: Orthopedics;  Laterality: Left;  . TOTAL KNEE ARTHROPLASTY Right 03/11/2016   Procedure: TOTAL KNEE ARTHROPLASTY;  Surgeon: Kennedy Bucker, MD;  Location: [facility];  Service: Orthopedics;  Laterality: Right;    Home Medications:  Allergies as of 06/28/2020      Reactions   Lisinopril Swelling      Medication List       Accurate as of June 28, 2020 11:59 PM. If you have any questions, ask your nurse or doctor.        Accu-Chek Guide test strip Generic drug: glucose blood CHECK FASTING BLOOD SUGAR ONCE DAILY AND UP TO 4 TIMES  DAILY AS DIRECTED   glucose blood test strip   acetaminophen 650 MG CR tablet Commonly known as: TYLENOL Take 650-1,300 mg by mouth 2 (two) times daily as needed for pain.   Aspirin Low Dose 81 MG EC tablet Generic drug: aspirin TAKE 1 TABLET BY MOUTH EVERY DAY   atorvastatin 40 MG tablet Commonly known as: LIPITOR Take 1 tablet (40 mg total) by mouth every evening.   blood glucose meter kit and supplies Dispense based on patient and insurance preference. Use up to four times daily as directed. (FOR ICD-10 E10.9, E11.9).   Calcium 600-D 600-400 MG-UNIT tablet Generic drug: Calcium  Carbonate-Vitamin D Take 1 tablet by mouth 2 (two) times daily.   doxycycline 100 MG capsule Commonly known as: VIBRAMYCIN Take 100 mg by mouth 2 (two) times daily.   fluticasone 50 MCG/ACT nasal spray Commonly known as: FLONASE Place 2 sprays into both nostrils daily.   furosemide 20 MG tablet Commonly known as: LASIX Take 20 mg by mouth every evening.   HYDROcodone-acetaminophen 5-325 MG tablet Commonly known as: NORCO/VICODIN Take 1 tablet by mouth every 6 (six) hours as needed for moderate pain or severe pain.   imipramine 25 MG tablet Commonly known as: TOFRANIL TAKE 1 TABLET BY MOUTH EVERYDAY AT BEDTIME   ketoconazole 2 % cream Commonly known as: NIZORAL Apply 1 application topically 2 (two) times daily as needed (rash).   Magnesium Oxide 500 MG Caps Take 500 mg by mouth daily.   metoprolol succinate 50 MG 24 hr tablet Commonly known as: TOPROL-XL Take 1 tablet (50 mg total) by mouth daily. Take with or immediately following a meal.   midodrine 5 MG tablet Commonly known as: PROAMATINE Take 1 tablet (5 mg total) by mouth 3 (three) times daily with meals. What changed: when to take this   mirabegron ER 50 MG Tb24 tablet Commonly known as: Myrbetriq Take 1 tablet (50 mg total) by mouth daily. Started by: Zara Council, PA-C   potassium chloride 10 MEQ tablet Commonly known as: KLOR-CON Take 10 mEq by mouth daily.   Rocklatan 0.02-0.005 % Soln Generic drug: Netarsudil-Latanoprost Place 1 drop into both eyes at bedtime.   Simbrinza 1-0.2 % Susp Generic drug: Brinzolamide-Brimonidine Place 1 drop into both eyes 2 (two) times daily.       Allergies:  Allergies  Allergen Reactions  . Lisinopril Swelling    Family History: Family History  Problem Relation Age of Onset  . Diabetes Mother   . Hyperlipidemia Mother   . Hypertension Mother   . Diabetes Father   . Hyperlipidemia Father   . Hypertension Father   . Obesity Father   . Hypertension  Sister   . Hyperlipidemia Sister   . Hyperlipidemia Sister   . Breast cancer Neg Hx     Social History:  reports that she has never smoked. She has never used smokeless tobacco. She reports that she does not drink alcohol and does not use drugs.  ROS: For pertinent review of systems please refer to history of present illness  Physical Exam: BP 140/80   Pulse 77   Constitutional:  Well nourished. Alert and oriented, No acute distress. HEENT: [place] AT, mask in place.  Trachea midline Cardiovascular: No clubbing, cyanosis, or edema. Respiratory: Normal respiratory effort, no increased work of breathing. Neurologic: Grossly intact, no focal deficits, moving all 4 extremities.  In wheel chair.  Psychiatric: Normal mood and affect.   Laboratory Data: Lab Results  Component Value Date  WBC 5.1 07/06/2020   HGB 11.5 (L) 07/06/2020   HCT 35.9 (L) 07/06/2020   MCV 94.0 07/06/2020   PLT 144 (L) 07/06/2020    Lab Results  Component Value Date   CREATININE 1.28 (H) 07/06/2020    Lab Results  Component Value Date   HGBA1C 5.5 05/29/2020    Lab Results  Component Value Date   TSH 2.837 08/07/2019       Component Value Date/Time   CHOL 152 01/27/2020 1616   CHOL 166 11/26/2015 1122   HDL 69 01/27/2020 1616   HDL 81 11/26/2015 1122   CHOLHDL 2.2 01/27/2020 1616   VLDL 9 11/06/2016 0913   LDLCALC 64 01/27/2020 1616    Lab Results  Component Value Date   AST 25 07/06/2020   Lab Results  Component Value Date   ALT 17 07/06/2020   I have reviewed the labs.  Pertinent Imaging  No recent imaging  Assessment & Plan:    1. Mixed incontinence At goal with Myrbetriq to 50 mg daily RTC in 6 months for OAB questionnaire and PVR  2. Nocturia Advised patient to restart her CPAP usage  Return in about 6 months (around 12/27/2020) for PVR and OAB questionnaire.  These notes generated with voice recognition software. I apologize for typographical errors.  Zara Council, PA-C  [facility] [address] [place], [place] [address] [phone]

## 2020-06-28 ENCOUNTER — Ambulatory Visit (INDEPENDENT_AMBULATORY_CARE_PROVIDER_SITE_OTHER): Payer: BC Managed Care – PPO | Admitting: Urology

## 2020-06-28 ENCOUNTER — Other Ambulatory Visit: Payer: Self-pay

## 2020-06-28 VITALS — BP 140/80 | HR 77

## 2020-06-28 DIAGNOSIS — R351 Nocturia: Secondary | ICD-10-CM | POA: Diagnosis not present

## 2020-06-28 DIAGNOSIS — N3946 Mixed incontinence: Secondary | ICD-10-CM

## 2020-06-28 MED ORDER — MIRABEGRON ER 50 MG PO TB24: 50.0000 mg | ORAL_TABLET | Freq: Every day | ORAL | 3 refills | Status: DC

## 2020-06-29 ENCOUNTER — Other Ambulatory Visit: Payer: Self-pay | Admitting: Urology

## 2020-07-03 ENCOUNTER — Other Ambulatory Visit: Payer: Self-pay | Admitting: Urology

## 2020-07-06 ENCOUNTER — Other Ambulatory Visit: Payer: Self-pay

## 2020-07-06 ENCOUNTER — Inpatient Hospital Stay (HOSPITAL_BASED_OUTPATIENT_CLINIC_OR_DEPARTMENT_OTHER): Payer: BC Managed Care – PPO | Admitting: Oncology

## 2020-07-06 ENCOUNTER — Inpatient Hospital Stay: Payer: BC Managed Care – PPO | Attending: Oncology

## 2020-07-06 ENCOUNTER — Encounter: Payer: Self-pay | Admitting: Oncology

## 2020-07-06 VITALS — BP 126/81 | HR 67 | Temp 97.6°F

## 2020-07-06 DIAGNOSIS — Z95828 Presence of other vascular implants and grafts: Secondary | ICD-10-CM

## 2020-07-06 DIAGNOSIS — Z833 Family history of diabetes mellitus: Secondary | ICD-10-CM | POA: Diagnosis not present

## 2020-07-06 DIAGNOSIS — G4733 Obstructive sleep apnea (adult) (pediatric): Secondary | ICD-10-CM | POA: Insufficient documentation

## 2020-07-06 DIAGNOSIS — Z79899 Other long term (current) drug therapy: Secondary | ICD-10-CM | POA: Insufficient documentation

## 2020-07-06 DIAGNOSIS — Z87898 Personal history of other specified conditions: Secondary | ICD-10-CM

## 2020-07-06 DIAGNOSIS — Z7901 Long term (current) use of anticoagulants: Secondary | ICD-10-CM | POA: Diagnosis not present

## 2020-07-06 DIAGNOSIS — Z8249 Family history of ischemic heart disease and other diseases of the circulatory system: Secondary | ICD-10-CM | POA: Insufficient documentation

## 2020-07-06 DIAGNOSIS — E785 Hyperlipidemia, unspecified: Secondary | ICD-10-CM | POA: Diagnosis not present

## 2020-07-06 DIAGNOSIS — Z86718 Personal history of other venous thrombosis and embolism: Secondary | ICD-10-CM | POA: Insufficient documentation

## 2020-07-06 DIAGNOSIS — I10 Essential (primary) hypertension: Secondary | ICD-10-CM | POA: Diagnosis not present

## 2020-07-06 DIAGNOSIS — N189 Chronic kidney disease, unspecified: Secondary | ICD-10-CM | POA: Diagnosis not present

## 2020-07-06 DIAGNOSIS — E669 Obesity, unspecified: Secondary | ICD-10-CM | POA: Insufficient documentation

## 2020-07-06 DIAGNOSIS — I82411 Acute embolism and thrombosis of right femoral vein: Secondary | ICD-10-CM

## 2020-07-06 DIAGNOSIS — D649 Anemia, unspecified: Secondary | ICD-10-CM | POA: Diagnosis not present

## 2020-07-06 DIAGNOSIS — D6851 Activated protein C resistance: Secondary | ICD-10-CM | POA: Diagnosis not present

## 2020-07-06 DIAGNOSIS — E119 Type 2 diabetes mellitus without complications: Secondary | ICD-10-CM | POA: Insufficient documentation

## 2020-07-06 DIAGNOSIS — Z8349 Family history of other endocrine, nutritional and metabolic diseases: Secondary | ICD-10-CM | POA: Diagnosis not present

## 2020-07-06 LAB — COMPREHENSIVE METABOLIC PANEL
ALT: 17 U/L (ref 0–44)
AST: 25 U/L (ref 15–41)
Albumin: 3.7 g/dL (ref 3.5–5.0)
Alkaline Phosphatase: 83 U/L (ref 38–126)
Anion gap: 7 (ref 5–15)
BUN: 20 mg/dL (ref 8–23)
CO2: 28 mmol/L (ref 22–32)
Calcium: 8.9 mg/dL (ref 8.9–10.3)
Chloride: 102 mmol/L (ref 98–111)
Creatinine, Ser: 1.28 mg/dL — ABNORMAL HIGH (ref 0.44–1.00)
GFR, Estimated: 44 mL/min — ABNORMAL LOW (ref >60)
Glucose, Bld: 84 mg/dL (ref 70–99)
Potassium: 3.9 mmol/L (ref 3.5–5.1)
Sodium: 137 mmol/L (ref 135–145)
Total Bilirubin: 0.6 mg/dL (ref 0.3–1.2)
Total Protein: 8 g/dL (ref 6.5–8.1)

## 2020-07-06 LAB — CBC WITH DIFFERENTIAL/PLATELET
Abs Immature Granulocytes: 0.02 10*3/uL (ref 0.00–0.07)
Basophils Absolute: 0 10*3/uL (ref 0.0–0.1)
Basophils Relative: 1 %
Eosinophils Absolute: 0.2 10*3/uL (ref 0.0–0.5)
Eosinophils Relative: 3 %
HCT: 35.9 % — ABNORMAL LOW (ref 36.0–46.0)
Hemoglobin: 11.5 g/dL — ABNORMAL LOW (ref 12.0–15.0)
Immature Granulocytes: 0 %
Lymphocytes Relative: 19 %
Lymphs Abs: 1 10*3/uL (ref 0.7–4.0)
MCH: 30.1 pg (ref 26.0–34.0)
MCHC: 32 g/dL (ref 30.0–36.0)
MCV: 94 fL (ref 80.0–100.0)
Monocytes Absolute: 0.4 10*3/uL (ref 0.1–1.0)
Monocytes Relative: 8 %
Neutro Abs: 3.5 10*3/uL (ref 1.7–7.7)
Neutrophils Relative %: 69 %
Platelets: 144 10*3/uL — ABNORMAL LOW (ref 150–400)
RBC: 3.82 MIL/uL — ABNORMAL LOW (ref 3.87–5.11)
RDW: 17.6 % — ABNORMAL HIGH (ref 11.5–15.5)
WBC: 5.1 10*3/uL (ref 4.0–10.5)
nRBC: 0 % (ref 0.0–0.2)

## 2020-07-06 NOTE — Telephone Encounter (Signed)
Done

## 2020-07-06 NOTE — Progress Notes (Signed)
Hematology/Oncology Follow Up Note [facility]  [phone] [phone]  Patient Care Team: [name], MD as PCP - General (Family Medicine) Yolonda Kida, MD as Consulting Physician (Cardiology) Albertine Patricia, DPM as Consulting Physician (Podiatry) Lorelee Cover., MD as Consulting Physician (Ophthalmology) Hessie Knows, MD as Consulting Physician (Orthopedic Surgery) Murrell Redden, MD as Consulting Physician (Urology) Cira Servant, DO as Consulting Physician (Rehabilitation) [name], MD as Consulting Physician (Infectious Diseases)   Name of the patient: Becky Trevino  [id number]  1945/11/12   REASON FOR VISIT  follow-up for DVT and hematoma  Pertinent hematology history 74 y.o. female with past medical history listed as below presents for follow-up of DVT. She was admitted from 10/27/2019-11/07/2019 for right lower extremity cellulitis/wound infection, bacteremia with group B streptococcus, right lower extremity DVT. # Patient had ankle fracture status post a recent right ankle ORIF on 09/02/2019 by Dr. Araceli Bouche.  Patient has a wound VAC She was noticed to have right lower extremity swelling, mild erythema and warmth with associated tenderness. 11/24/2019 US venous right lower extremity showed a DVT within the right popliteal vein, with superficial thrombus seen at the saphenofemoral junction. Patient tells me that she was diagnosed with "blood clots" in August 2020.  At that time she had pain and swelling for about a week and went to ER for evaluation.    04/29/2019 ultrasound was negative for DVT, only showed a superficial thrombophlebitis involving a branch of the great saphenous vein in the proximal thigh.  She was fine to have right Baker's cyst.  Patient denies any immobilization factors contributing to this event.  Patient had elevated D-dimer at that time. Patient was started on Xarelto-starter kit by  emergency room.  Patient was recommended to have US aorta iliac Doppler by primary care provider Dr. Ancil Boozer and I do not see the study was done.  Xarelto was continued outpatient for thrombophlebitis..  Patient was hospitalized again at the end of November 2020 due to syncope episodes which likely due to orthostatic hypotension.  Patient was noted to have anemia, CT abdomen pelvis to evaluate after trauma, showed left gluteal hematoma without signs of extravasation.  Xarelto was held during the hospitalization.  Saw cardiology [place].  Patient was seen by Dr. Ancil Boozer on 08/23/2019 and the Xarelto was stopped.  09/02/2019 patient slipped and obtained right ankle fracture.  Status post ORIF procedure by Dr. Luana Shu.  Postop patient was recommended to start on Lovenox 40 mg daily injections prophylactically until her presentation to emergency room in February.  At that point, she was found to have a provoked right lower extremity DVT and she was seen by me during that admission.  Patient was recommended to start therapeutic Lovenox.  During the same hospitalization, patient developed spontaneous iliopsoas hematomaand therapeutic Lovenox was discontinued. Patient had IVC filter placed on 11/02/2019.  Patient was recommended to have outpatient follow-up with me. Patient was recently seen by Dr. Ancil Boozer and was found to have worsening of right lower extremity swelling, Doppler ultrasound was obtained on 02/03/2020 which was positive for DVT in the right lower extremity, increased clot burden in the right lower extremity since 10/27/2019.  Thrombus extending from the right common femoral vein to the right calf.  Patient was referred back to me for evaluation and management.  Currently off anticoagulation due to recurrent hematoma.  IVC filter was placed.  No plan for filter retrieval due to risk of recurrent lower extremity thrombosis. Negative antiphospholipid syndrome  antibodies, prothrombin gene mutation, factor  V Leiden mutation.  INTERVAL HISTORY Becky Trevino is a 74 y.o. female who has above history reviewed by me today presents for follow up visit for DVT Problems and complaints are listed below:  She is not able to tolerate anticoagulation due to recurrent hematoma.  Has IVC filter. She continues to have persistent leg swelling and she uses lymphatic pump. She is in the process of being evaluated at [facility] regarding her leg pain and potential further orthopedic surgery.  Review of Systems  Constitutional: Positive for fatigue. Negative for appetite change, chills and fever.  HENT:   Negative for hearing loss and voice change.   Eyes: Negative for eye problems.  Respiratory: Negative for chest tightness and cough.   Cardiovascular: Positive for leg swelling. Negative for chest pain.       Hip/thigh swelling  Gastrointestinal: Negative for abdominal distention, abdominal pain and blood in stool.  Endocrine: Negative for hot flashes.  Genitourinary: Negative for difficulty urinating, frequency and nocturia.   Musculoskeletal: Negative for arthralgias.  Skin: Negative for itching and rash.  Neurological: Negative for extremity weakness.  Hematological: Negative for adenopathy.  Psychiatric/Behavioral: Negative for confusion.      Allergies  Allergen Reactions  . Lisinopril Swelling     Past Medical History:  Diagnosis Date  . Cervical spondylosis   . Chronic kidney disease    Stage 3  . DDD (degenerative disc disease), cervical    [facility]  . Diabetes mellitus without complication ([place])   . Diverticulosis   . Hyperlipidemia   . Hypertension   . Intertrigo   . Leg cramps   . Leg varices   . Obesity   . OSA (obstructive sleep apnea)   . Symptomatic menopausal or female climacteric states   . Syncope      Past Surgical History:  Procedure Laterality Date  . ABDOMINAL HYSTERECTOMY  1993   Total  . BUNIONECTOMY Bilateral 1993  . I & D KNEE WITH POLY EXCHANGE  Right 11/19/2017   Procedure: RIGHT KNEE POLY EXCHANGE WITH IRRIGATION AND DEBRIDEMENT;  Surgeon: Hessie Knows, MD;  Location: [facility] OR[facility];  Service: Orthopedics;  Laterality: Right;  . JOINT REPLACEMENT     bilateral knee  . LAMINECTOMY  11/14/2013    Cervical Fusion , [facility], Dr. Delilah Shan  . LASER ABLATION Bilateral 07/29/2012   Dr. Lucky Cowboy  . LOWER EXTREMITY ANGIOGRAPHY Right 11/02/2019   Procedure: Lower Extremity Angiography;  Surgeon: Katha Cabal, MD;  Location: [facility];  Service: Cardiovascular;  Laterality: Right;  . ORIF ANKLE FRACTURE Right 09/02/2019   Procedure: OPEN REDUCTION INTERNAL FIXATION (ORIF) ANKLE FRACTURE BIMALLEOLAR;  Surgeon: [name], DPM;  Location: [facility];  Service: Podiatry;  Laterality: Right;  . PERIPHERAL VASCULAR THROMBECTOMY Right 03/02/2020   Procedure: PERIPHERAL VASCULAR THROMBECTOMY;  Surgeon: Algernon Huxley, MD;  Location: [place] [facility];  Service: Cardiovascular;  Laterality: Right;  . REPLACEMENT TOTAL KNEE Left 03/2010   Dr. Rudene Christians  . TOTAL HIP ARTHROPLASTY Left 12/11/2015   Procedure: TOTAL HIP ARTHROPLASTY ANTERIOR APPROACH;  Surgeon: Hessie Knows, MD;  Location: [facility];  Service: Orthopedics;  Laterality: Left;  . TOTAL KNEE ARTHROPLASTY Right 03/11/2016   Procedure: TOTAL KNEE ARTHROPLASTY;  Surgeon: Hessie Knows, MD;  Location: [facility];  Service: Orthopedics;  Laterality: Right;    Social History   Socioeconomic History  . Marital status: Married    Spouse name: Not on file  . Number of  children: 1  . Years of education: Not on file  . Highest education level: Not on file  Occupational History  . Not on file  Tobacco Use  . Smoking status: Never Smoker  . Smokeless tobacco: Never Used  Vaping Use  . Vaping Use: Never used  Substance and Sexual Activity  . Alcohol use: No    Alcohol/week: 0.0 standard drinks  . Drug use: No  . Sexual activity: Yes    Partners: Male  Other Topics Concern  . Not on file  Social  History Narrative   Married, one adopted grown child    Social Determinants of Health   Financial Resource Strain:   . Difficulty of Paying Living Expenses: Not on file  Food Insecurity:   . Worried About Charity fundraiser in the Last Year: Not on file  . Ran Out of Food in the Last Year: Not on file  Transportation Needs:   . Lack of Transportation (Medical): Not on file  . Lack of Transportation (Non-Medical): Not on file  Physical Activity:   . Days of Exercise per Week: Not on file  . Minutes of Exercise per Session: Not on file  Stress:   . Feeling of Stress : Not on file  Social Connections:   . Frequency of Communication with Friends and Family: Not on file  . Frequency of Social Gatherings with Friends and Family: Not on file  . Attends Religious Services: Not on file  . Active Member of Clubs or Organizations: Not on file  . Attends Archivist Meetings: Not on file  . Marital Status: Not on file  Intimate Partner Violence:   . Fear of Current or Ex-Partner: Not on file  . Emotionally Abused: Not on file  . Physically Abused: Not on file  . Sexually Abused: Not on file    Family History  Problem Relation Age of Onset  . Diabetes Mother   . Hyperlipidemia Mother   . Hypertension Mother   . Diabetes Father   . Hyperlipidemia Father   . Hypertension Father   . Obesity Father   . Hypertension Sister   . Hyperlipidemia Sister   . Hyperlipidemia Sister   . Breast cancer Neg Hx      Current Outpatient Medications:  .  ACCU-CHEK GUIDE test strip, CHECK FASTING BLOOD SUGAR ONCE DAILY AND UP TO 4 TIMES DAILY AS DIRECTED, Disp: 100 strip, Rfl: 12 .  acetaminophen (TYLENOL) 650 MG CR tablet, Take 650-1,300 mg by mouth 2 (two) times daily as needed for pain., Disp: , Rfl:  .  ASPIRIN LOW DOSE 81 MG EC tablet, TAKE 1 TABLET BY MOUTH EVERY DAY, Disp: 30 tablet, Rfl: 0 .  atorvastatin (LIPITOR) 40 MG tablet, Take 1 tablet (40 mg total) by mouth every evening.,  Disp: 90 tablet, Rfl: 1 .  blood glucose meter kit and supplies, Dispense based on patient and insurance preference. Use up to four times daily as directed. (FOR ICD-10 E10.9, E11.9)., Disp: 1 each, Rfl: 0 .  Calcium Carbonate-Vitamin D (CALCIUM 600-D) 600-400 MG-UNIT per tablet, Take 1 tablet by mouth 2 (two) times daily., Disp: , Rfl:  .  doxycycline (VIBRAMYCIN) 100 MG capsule, Take 100 mg by mouth 2 (two) times daily., Disp: , Rfl:  .  fluticasone (FLONASE) 50 MCG/ACT nasal spray, Place 2 sprays into both nostrils daily., Disp: 16 g, Rfl: 2 .  glucose blood test strip, , Disp: , Rfl:  .  HYDROcodone-acetaminophen (NORCO/VICODIN) 5-325 MG  tablet, Take 1 tablet by mouth every 6 (six) hours as needed for moderate pain or severe pain., Disp: 20 tablet, Rfl: 0 .  imipramine (TOFRANIL) 25 MG tablet, TAKE 1 TABLET BY MOUTH EVERYDAY AT BEDTIME, Disp: 90 tablet, Rfl: 3 .  ketoconazole (NIZORAL) 2 % cream, Apply 1 application topically 2 (two) times daily as needed (rash). , Disp: , Rfl:  .  Magnesium Oxide 500 MG CAPS, Take 500 mg by mouth daily. , Disp: , Rfl:  .  metoprolol succinate (TOPROL-XL) 50 MG 24 hr tablet, Take 1 tablet (50 mg total) by mouth daily. Take with or immediately following a meal., Disp: 90 tablet, Rfl: 0 .  midodrine (PROAMATINE) 5 MG tablet, Take 1 tablet (5 mg total) by mouth 3 (three) times daily with meals. (Patient taking differently: Take 5 mg by mouth 3 (three) times daily. ), Disp: 90 tablet, Rfl: 0 .  mirabegron ER (MYRBETRIQ) 50 MG TB24 tablet, Take 1 tablet (50 mg total) by mouth daily., Disp: 90 tablet, Rfl: 3 .  Netarsudil-Latanoprost (ROCKLATAN) 0.02-0.005 % SOLN, Place 1 drop into both eyes at bedtime., Disp: , Rfl:  .  potassium chloride (KLOR-CON) 10 MEQ tablet, Take 10 mEq by mouth daily., Disp: , Rfl:  .  SIMBRINZA 1-0.2 % SUSP, Place 1 drop into both eyes 2 (two) times daily., Disp: , Rfl:  .  furosemide (LASIX) 20 MG tablet, Take 20 mg by mouth every evening.   (Patient not taking: Reported on 07/06/2020), Disp: , Rfl:   Physical exam:  Vitals:   07/06/20 0955  BP: 126/81  Pulse: 67  Temp: 97.6 F (36.4 C)  TempSrc: Tympanic  SpO2: 99%   Physical Exam Constitutional:      General: She is not in acute distress.    Appearance: She is obese.     Comments: Patient sits in the wheelchair  HENT:     Head: Normocephalic and atraumatic.  Eyes:     General: No scleral icterus. Cardiovascular:     Rate and Rhythm: Normal rate and regular rhythm.     Heart sounds: Normal heart sounds.  Pulmonary:     Effort: Pulmonary effort is normal. No respiratory distress.     Breath sounds: No wheezing.  Abdominal:     General: Bowel sounds are normal. There is no distension.     Palpations: Abdomen is soft.  Musculoskeletal:        General: Swelling present. No deformity. Normal range of motion.     Cervical back: Normal range of motion and neck supple.     Comments: Bilateral lower extremity swelling  Skin:    General: Skin is warm and dry.     Findings: No erythema or rash.  Neurological:     Mental Status: She is alert and oriented to person, place, and time. Mental status is at baseline.     Cranial Nerves: No cranial nerve deficit.     Coordination: Coordination normal.  Psychiatric:        Mood and Affect: Mood normal.     CMP Latest Ref Rng & Units 07/06/2020  Glucose 70 - 99 mg/dL 84  BUN 8 - 23 mg/dL 20  Creatinine 0.44 - 1.00 mg/dL 1.28(H)  Sodium 135 - 145 mmol/L 137  Potassium 3.5 - 5.1 mmol/L 3.9  Chloride 98 - 111 mmol/L 102  CO2 22 - 32 mmol/L 28  Calcium 8.9 - 10.3 mg/dL 8.9  Total Protein 6.5 - 8.1 g/dL 8.0  Total Bilirubin  0.3 - 1.2 mg/dL 0.6  Alkaline Phos 38 - 126 U/L 83  AST 15 - 41 U/L 25  ALT 0 - 44 U/L 17   CBC Latest Ref Rng & Units 07/06/2020  WBC 4.0 - 10.5 K/uL 5.1  Hemoglobin 12.0 - 15.0 g/dL 11.5(L)  Hematocrit 36 - 46 % 35.9(L)  Platelets 150 - 400 K/uL 144(L)    RADIOGRAPHIC STUDIES: I have  personally reviewed the radiological images as listed and agreed with the findings in the report. No results found.   Assessment and plan  1. History of pelvic hematoma   2. History of deep venous thrombosis (DVT) of distal vein of right lower extremity   3. Presence of IVC filter    #History of proximal lower extremity DVT,  Chronic lower extremity DVT. She is currently off anticoagulation due to recurrent hematoma.  #Chronic lower extremity lymphedema, continue compression stocking in the lymphatic pump.  Follow-up with vascular surgery #Pending orthopedic surgeon evaluation at [facility].   Orders Placed This Encounter  Procedures  . CBC with Differential/Platelet    Standing Status:   Future    Standing Expiration Date:   07/06/2021  . Comprehensive metabolic panel    Standing Status:   Future    Standing Expiration Date:   07/06/2021    Follow-up 12 months We spent sufficient time to discuss many aspect of care, questions were answered to patient's satisfaction.  [name], MD, PhD Hematology Oncology [facility] Pager- [phone] 07/06/2020

## 2020-07-06 NOTE — Progress Notes (Signed)
Patient denies new problems/concerns today.   °

## 2020-07-08 ENCOUNTER — Other Ambulatory Visit: Payer: Self-pay | Admitting: Family Medicine

## 2020-07-08 NOTE — Telephone Encounter (Signed)
Requested Prescriptions  Pending Prescriptions Disp Refills  . aspirin 81 MG EC tablet [Pharmacy Med Name: [facility] ASPIRIN EC 81 MG TABLET] 90 tablet 1    Sig: TAKE 1 TABLET BY MOUTH EVERY DAY     Analgesics:  NSAIDS - aspirin Passed - 07/08/2020  2:47 PM      Passed - Patient is not pregnant      Passed - Valid encounter within last 12 months    Recent Outpatient Visits          1 month ago Type 2 diabetes mellitus with diabetic neuropathy, without long-term current use of insulin [facility])   [facility] [place], Drue Stager, MD   5 months ago Type 2 diabetes mellitus with diabetic neuropathy, without long-term current use of insulin [facility])   [facility] [place], Drue Stager, MD   9 months ago Chronic infection of right knee [facility])   [facility] [name], MD   10 months ago Hospital discharge follow-up   [facility] [name], MD   1 year ago Type 2 diabetes mellitus with diabetic neuropathy, without long-term current use of insulin [facility])   [facility] [name], MD      Future Appointments            In 3 weeks  [facility], [place]   In 1 month [name], MD [facility], [place]   In 5 months [name], Gordan Payment [facility]

## 2020-07-19 ENCOUNTER — Encounter: Payer: Self-pay | Admitting: Urology

## 2020-07-22 DIAGNOSIS — L03115 Cellulitis of right lower limb: Secondary | ICD-10-CM | POA: Diagnosis not present

## 2020-07-24 DIAGNOSIS — G5722 Lesion of femoral nerve, left lower limb: Secondary | ICD-10-CM | POA: Diagnosis not present

## 2020-07-27 DIAGNOSIS — E1140 Type 2 diabetes mellitus with diabetic neuropathy, unspecified: Secondary | ICD-10-CM | POA: Diagnosis not present

## 2020-07-27 DIAGNOSIS — R2681 Unsteadiness on feet: Secondary | ICD-10-CM | POA: Diagnosis not present

## 2020-07-27 DIAGNOSIS — L03115 Cellulitis of right lower limb: Secondary | ICD-10-CM | POA: Diagnosis not present

## 2020-07-29 DIAGNOSIS — L03115 Cellulitis of right lower limb: Secondary | ICD-10-CM | POA: Diagnosis not present

## 2020-07-31 ENCOUNTER — Ambulatory Visit (INDEPENDENT_AMBULATORY_CARE_PROVIDER_SITE_OTHER): Payer: BC Managed Care – PPO

## 2020-07-31 DIAGNOSIS — Z780 Asymptomatic menopausal state: Secondary | ICD-10-CM

## 2020-07-31 DIAGNOSIS — G4733 Obstructive sleep apnea (adult) (pediatric): Secondary | ICD-10-CM | POA: Diagnosis not present

## 2020-07-31 DIAGNOSIS — Z0000 Encounter for general adult medical examination without abnormal findings: Secondary | ICD-10-CM | POA: Diagnosis not present

## 2020-07-31 DIAGNOSIS — Z1231 Encounter for screening mammogram for malignant neoplasm of breast: Secondary | ICD-10-CM

## 2020-07-31 NOTE — Progress Notes (Signed)
Subjective:   Becky Trevino is a 74 y.o. female who presents for Medicare Annual (Subsequent) preventive examination.  Virtual Visit via Telephone Note  I connected with  Becky Trevino on 07/31/20 at  3:30 PM EST by telephone and verified that I am speaking with the correct person using two identifiers.  Medicare Annual Wellness visit completed telephonically due to Covid-19 pandemic.   Location: Patient: home Provider: [place]   I discussed the limitations, risks, security and privacy concerns of performing an evaluation and management service by telephone and the availability of in person appointments. The patient expressed understanding and agreed to proceed.  Unable to perform video visit due to video visit attempted and failed and/or patient does not have video capability.   Some vital signs may be absent or patient reported.   Clemetine Marker, LPN    Review of Systems     Cardiac Risk Factors include: advanced age (>20mn, >>87women);diabetes mellitus;dyslipidemia;hypertension;sedentary lifestyle;obesity (BMI >30kg/m2)     Objective:    Today's Vitals   07/31/20 1505  PainSc: 7    There is no height or weight on file to calculate BMI.  Advanced Directives 07/31/2020 07/06/2020 04/04/2020 03/02/2020 02/09/2020 10/27/2019 09/02/2019  Does Patient Have a Medical Advance Directive? Yes No Yes Yes Yes No Yes  Type of [place] - - Living will [place] - [place]  Does patient want to make changes to medical advance directive? - No - Patient declined - Yes (MAU/Ambulatory/Procedural Areas - Information given) - - No - Patient declined  Copy of [place] Chart? No - copy requested - - - - - No - copy requested  Would patient like information on creating a medical advance directive? - - - - - No - Patient declined No - Patient declined     Current Medications (verified) Outpatient Encounter Medications as of 07/31/2020  Medication Sig  . ACCU-CHEK GUIDE test strip CHECK FASTING BLOOD SUGAR ONCE DAILY AND UP TO 4 TIMES DAILY AS DIRECTED  . acetaminophen (TYLENOL) 650 MG CR tablet Take 650-1,300 mg by mouth 2 (two) times daily as needed for pain.  .Marland Kitchenaspirin 81 MG EC tablet TAKE 1 TABLET BY MOUTH EVERY DAY  . atorvastatin (LIPITOR) 40 MG tablet Take 1 tablet (40 mg total) by mouth every evening.  . blood glucose meter kit and supplies Dispense based on patient and insurance preference. Use up to four times daily as directed. (FOR ICD-10 E10.9, E11.9).  . Calcium Carbonate-Vitamin D (CALCIUM 600-D) 600-400 MG-UNIT per tablet Take 1 tablet by mouth 2 (two) times daily.  .Marland Kitchendoxycycline (VIBRAMYCIN) 100 MG capsule Take 100 mg by mouth 2 (two) times daily.  . fluticasone (FLONASE) 50 MCG/ACT nasal spray Place 2 sprays into both nostrils daily.  . furosemide (LASIX) 20 MG tablet Take 20 mg by mouth every evening.   .Marland Kitchenimipramine (TOFRANIL) 25 MG tablet TAKE 1 TABLET BY MOUTH EVERYDAY AT BEDTIME  . ketoconazole (NIZORAL) 2 % cream Apply 1 application topically 2 (two) times daily as needed (rash).   .Marland Kitchenlatanoprost (XALATAN) 0.005 % ophthalmic solution SMARTSIG:In Eye(s)  . Magnesium Oxide 500 MG CAPS Take 500 mg by mouth daily.   . metoprolol succinate (TOPROL-XL) 100 MG 24 hr tablet Take 100 mg by mouth daily.  . midodrine (PROAMATINE) 5 MG tablet Take 1 tablet (5 mg total) by mouth 3 (three) times daily with meals. (  Patient taking differently: Take 5 mg by mouth 3 (three) times daily. )  . mirabegron ER (MYRBETRIQ) 50 MG TB24 tablet Take 1 tablet (50 mg total) by mouth daily.  . potassium chloride (KLOR-CON) 10 MEQ tablet Take 10 mEq by mouth daily.  Marland Kitchen SIMBRINZA 1-0.2 % SUSP Place 1 drop into both eyes 2 (two) times daily.  . [DISCONTINUED] glucose blood test strip   . [DISCONTINUED] HYDROcodone-acetaminophen (NORCO/VICODIN) 5-325 MG  tablet Take 1 tablet by mouth every 6 (six) hours as needed for moderate pain or severe pain.  . [DISCONTINUED] metoprolol succinate (TOPROL-XL) 50 MG 24 hr tablet Take 1 tablet (50 mg total) by mouth daily. Take with or immediately following a meal.  . [DISCONTINUED] Netarsudil-Latanoprost (ROCKLATAN) 0.02-0.005 % SOLN Place 1 drop into both eyes at bedtime.   No facility-administered encounter medications on file as of 07/31/2020.    Allergies (verified) Lisinopril   History: Past Medical History:  Diagnosis Date  . Cervical spondylosis   . Chronic kidney disease    Stage 3  . DDD (degenerative disc disease), cervical    [facility]  . Diabetes mellitus without complication ([place])   . Diverticulosis   . Hyperlipidemia   . Hypertension   . Intertrigo   . Leg cramps   . Leg varices   . Obesity   . OSA (obstructive sleep apnea)   . Symptomatic menopausal or female climacteric states   . Syncope    Past Surgical History:  Procedure Laterality Date  . ABDOMINAL HYSTERECTOMY  1993   Total  . BUNIONECTOMY Bilateral 1993  . I & D KNEE WITH POLY EXCHANGE Right 11/19/2017   Procedure: RIGHT KNEE POLY EXCHANGE WITH IRRIGATION AND DEBRIDEMENT;  Surgeon: Hessie Knows, MD;  Location: [facility] OR[facility];  Service: Orthopedics;  Laterality: Right;  . JOINT REPLACEMENT     bilateral knee  . LAMINECTOMY  11/14/2013    Cervical Fusion , [facility], Dr. Delilah Shan  . LASER ABLATION Bilateral 07/29/2012   Dr. Lucky Cowboy  . LOWER EXTREMITY ANGIOGRAPHY Right 11/02/2019   Procedure: Lower Extremity Angiography;  Surgeon: Katha Cabal, MD;  Location: [place] [facility];  Service: Cardiovascular;  Laterality: Right;  . ORIF ANKLE FRACTURE Right 09/02/2019   Procedure: OPEN REDUCTION INTERNAL FIXATION (ORIF) ANKLE FRACTURE BIMALLEOLAR;  Surgeon: [name], DPM;  Location: [facility];  Service: Podiatry;  Laterality: Right;  . PERIPHERAL VASCULAR THROMBECTOMY Right 03/02/2020   Procedure: PERIPHERAL VASCULAR  THROMBECTOMY;  Surgeon: Algernon Huxley, MD;  Location: [place] [facility];  Service: Cardiovascular;  Laterality: Right;  . REPLACEMENT TOTAL KNEE Left 03/2010   Dr. Rudene Christians  . TOTAL HIP ARTHROPLASTY Left 12/11/2015   Procedure: TOTAL HIP ARTHROPLASTY ANTERIOR APPROACH;  Surgeon: Hessie Knows, MD;  Location: [facility];  Service: Orthopedics;  Laterality: Left;  . TOTAL KNEE ARTHROPLASTY Right 03/11/2016   Procedure: TOTAL KNEE ARTHROPLASTY;  Surgeon: Hessie Knows, MD;  Location: [facility];  Service: Orthopedics;  Laterality: Right;   Family History  Problem Relation Age of Onset  . Diabetes Mother   . Hyperlipidemia Mother   . Hypertension Mother   . Diabetes Father   . Hyperlipidemia Father   . Hypertension Father   . Obesity Father   . Hypertension Sister   . Hyperlipidemia Sister   . Hyperlipidemia Sister   . Breast cancer Neg Hx    Social History   Socioeconomic History  . Marital status: Married    Spouse name: Not on file  . Number of  children: 1  . Years of education: Not on file  . Highest education level: Not on file  Occupational History  . Not on file  Tobacco Use  . Smoking status: Never Smoker  . Smokeless tobacco: Never Used  Vaping Use  . Vaping Use: Never used  Substance and Sexual Activity  . Alcohol use: No    Alcohol/week: 0.0 standard drinks  . Drug use: No  . Sexual activity: Yes    Partners: Male  Other Topics Concern  . Not on file  Social History Narrative   Married, one adopted grown child    Social Determinants of Health   Financial Resource Strain: Low Risk   . Difficulty of Paying Living Expenses: Not hard at all  Food Insecurity: No Food Insecurity  . Worried About Charity fundraiser in the Last Year: Never true  . Ran Out of Food in the Last Year: Never true  Transportation Needs: No Transportation Needs  . Lack of Transportation (Medical): No  . Lack of Transportation (Non-Medical): No  Physical Activity: Inactive  . Days of Exercise  per Week: 0 days  . Minutes of Exercise per Session: 0 min  Stress: No Stress Concern Present  . Feeling of Stress : Not at all  Social Connections: Moderately Integrated  . Frequency of Communication with Friends and Family: More than three times a week  . Frequency of Social Gatherings with Friends and Family: More than three times a week  . Attends Religious Services: More than 4 times per year  . Active Member of Clubs or Organizations: No  . Attends Archivist Meetings: Never  . Marital Status: Married    Tobacco Counseling Counseling given: Not Answered   Clinical Intake:  Pre-visit preparation completed: Yes  Pain : 0-10 Pain Score: 7  Pain Type: Chronic pain Pain Location: Knee Pain Orientation: Right Pain Descriptors / Indicators: Aching, Sore Pain Onset: More than a month ago Pain Frequency: Constant     Nutritional Status: BMI > 30  Obese Nutritional Risks: None Diabetes: Yes CBG done?: No Did pt. bring in CBG monitor from home?: No  How often do you need to have someone help you when you read instructions, pamphlets, or other written materials from your doctor or pharmacy?: 1 - Never  Nutrition Risk Assessment:  Has the patient had any N/V/D within the last 2 months?  No  Does the patient have any non-healing wounds?  No  Has the patient had any unintentional weight loss or weight gain?  No   Diabetes:  Is the patient diabetic?  Yes  If diabetic, was a CBG obtained today?  No  Did the patient bring in their glucometer from home?  No  How often do you monitor your CBG's? rarely.   Financial Strains and Diabetes Management:  Are you having any financial strains with the device, your supplies or your medication? No .  Does the patient want to be seen by Chronic Care Management for management of their diabetes?  No  Would the patient like to be referred to a Nutritionist or for Diabetic Management?  No   Diabetic Exams:  Diabetic Eye  Exam: Completed per patient by Dr. Gloriann Loan, will request records. Overdue for diabetic eye exam.   Diabetic Foot Exam: Completed 05/12/19. Pt has been advised about the importance in completing this exam. Pt is scheduled for diabetic foot exam on 08/28/20.    Interpreter Needed?: No  Information entered by :: [employer]  Becky Pedigo LPN   Activities of Daily Living In your present state of health, do you have any difficulty performing the following activities: 07/31/2020 05/29/2020  Hearing? N N  Comment declines hearing aids -  Vision? N N  Difficulty concentrating or making decisions? N N  Walking or climbing stairs? Y Y  Dressing or bathing? N N  Comment - -  Doing errands, shopping? N Y  Conservation officer, nature and eating ? N -  Using the Toilet? N -  In the past six months, have you accidently leaked urine? Y -  Comment wears depends -  Do you have problems with loss of bowel control? N -  Managing your Medications? N -  Managing your Finances? N -  Housekeeping or managing your Housekeeping? N -  Some recent data might be hidden    Patient Care Team: [name], MD as PCP - General (Family Medicine) Yolonda Kida, MD as Consulting Physician (Cardiology) Albertine Patricia, DPM as Consulting Physician (Podiatry) Lorelee Cover., MD as Consulting Physician (Ophthalmology) Hessie Knows, MD as Consulting Physician (Orthopedic Surgery) Murrell Redden, MD as Consulting Physician (Urology) Cira Servant, DO as Consulting Physician (Rehabilitation) [name], MD as Consulting Physician (Infectious Diseases)  Indicate any recent Medical Services you may have received from other than [facility] providers in the past year (date may be approximate).     Assessment:   This is a routine wellness examination for Becky Trevino.  Hearing/Vision screen  Hearing Screening   125Hz 250Hz 500Hz 1000Hz 2000Hz 3000Hz 4000Hz 6000Hz 8000Hz  Right ear:           Left ear:           Comments: Pt denies  hearing difficulty   Vision Screening Comments: Annual vision screenings done at [place] issues and exercise activities discussed: Current Exercise Habits: The patient does not participate in regular exercise at present, Exercise limited by: orthopedic condition(s)  Goals   None    Depression Screen PHQ 2/9 Scores 07/31/2020 01/27/2020 09/23/2019 08/23/2019 05/12/2019 11/25/2018 07/27/2018  PHQ - 2 Score 0 0 0 0 0 0 0  PHQ- 9 Score - 0 0 0 0 0 2    Fall Risk Fall Risk  07/31/2020 05/29/2020 01/27/2020 09/23/2019 08/23/2019  Falls in the past year? 0 0 0 1 0  Number falls in past yr: 0 0 0 0 0  Comment - - - - -  Injury with Fall? 0 0 0 1 0  Comment - - - 07/2019 hematoma -  Risk for fall due to : Impaired mobility;Impaired balance/gait - - - -  Follow up Falls prevention discussed - - - -    Any stairs in or around the home? No  If so, are there any without handrails? No  Home free of loose throw rugs in walkways, pet beds, electrical cords, etc? Yes  Adequate lighting in your home to reduce risk of falls? Yes   ASSISTIVE DEVICES UTILIZED TO PREVENT FALLS:  Life alert? No  Use of a cane, walker or w/c? Yes  Grab bars in the bathroom? No  Shower chair or bench in shower? Yes  Elevated toilet seat or a handicapped toilet? Yes   TIMED UP AND GO:  Was the test performed? No . Telephonic visit.   Cognitive Function:     6CIT Screen 07/31/2020  What Year? 0 points  What month? 0 points  What time? 0 points  Count back from 20 0 points  Months in reverse 0 points  Repeat phrase 0 points  Total Score 0    Immunizations Immunization History  Administered Date(s) Administered  . Fluad Quad(high Dose 65+) 05/12/2019, 05/29/2020  . Influenza, High Dose Seasonal PF 05/22/2016, 06/09/2017, 05/21/2018  . Influenza,inj,Quad PF,6+ Mos 04/26/2015  . Influenza-Unspecified 04/15/2014  . Moderna SARS-COVID-2 Vaccination 10/24/2019, 11/21/2019  . Pneumococcal  Conjugate-13 07/09/2006, 01/25/2015  . Pneumococcal Polysaccharide-23 03/11/2012  . Tdap 06/03/2006, 11/06/2016  . Zoster 06/19/2010    TDAP status: Up to date   Flu Vaccine status: Up to date   Pneumococcal vaccine status: Up to date   Covid-19 vaccine status: Completed vaccines  Qualifies for Shingles Vaccine? Yes   Zostavax completed Yes   Shingrix Completed?: No.    Education has been provided regarding the importance of this vaccine. Patient has been advised to call insurance company to determine out of pocket expense if they have not yet received this vaccine. Advised may also receive vaccine at local pharmacy or Health Dept. Verbalized acceptance and understanding.  Screening Tests Health Maintenance  Topic Date Due  . OPHTHALMOLOGY EXAM  10/13/2017  . FOOT EXAM  05/11/2020  . MAMMOGRAM  10/29/2020  . HEMOGLOBIN A1C  11/26/2020  . URINE MICROALBUMIN  01/29/2021  . COLONOSCOPY  05/22/2021  . TETANUS/TDAP  11/06/2026  . INFLUENZA VACCINE  Completed  . DEXA SCAN  Completed  . COVID-19 Vaccine  Completed  . Hepatitis C Screening  Completed  . PNA vac Low Risk Adult  Completed    Health Maintenance  Health Maintenance Due  Topic Date Due  . OPHTHALMOLOGY EXAM  10/13/2017  . FOOT EXAM  05/11/2020    Colorectal cancer screening: Completed 05/23/11. Repeat every 10 years   Mammogram status: Completed 10/29/18. Repeat every year    Bone Density status: Ordered today . Pt provided with contact info and advised to call to schedule appt.  Lung Cancer Screening: (Low Dose CT Chest recommended if Age 78-80 years, 30 pack-year currently smoking OR have quit w/in 15years.) does not qualify.   Additional Screening:  Hepatitis C Screening: does qualify; Completed 07/22/12  Vision Screening: Recommended annual ophthalmology exams for early detection of glaucoma and other disorders of the eye. Is the patient up to date with their annual eye exam?  Yes  Who is the provider or  what is the name of the office in which the patient attends annual eye exams? Dr. Gloriann Loan  Dental Screening: Recommended annual dental exams for proper oral hygiene  Community Resource Referral / Chronic Care Management: CRR required this visit?  No   CCM required this visit?  No      Plan:     I have personally reviewed and noted the following in the patient's chart:   . Medical and social history . Use of alcohol, tobacco or illicit drugs  . Current medications and supplements . Functional ability and status . Nutritional status . Physical activity . Advanced directives . List of other physicians . Hospitalizations, surgeries, and ER visits in previous 12 months . Vitals . Screenings to include cognitive, depression, and falls . Referrals and appointments  In addition, I have reviewed and discussed with patient certain preventive protocols, quality metrics, and best practice recommendations. A written personalized care plan for preventive services as well as general preventive health recommendations were provided to patient.     Clemetine Marker, LPN   [id number]   Nurse Notes: none

## 2020-07-31 NOTE — Patient Instructions (Signed)
Becky Trevino , Thank you for taking time to come for your Medicare Wellness Visit. I appreciate your ongoing commitment to your health goals. Please review the following plan we discussed and let me know if I can assist you in the future.   Screening recommendations/referrals: Colonoscopy: done 05/23/11. Due 05/2021 Mammogram: done 10/29/18. Please call [phone] to schedule your mammogram and bone density screening.  Bone Density: done 2011 Recommended yearly ophthalmology/optometry visit for glaucoma screening and checkup Recommended yearly dental visit for hygiene and checkup  Vaccinations: Influenza vaccine: done 05/29/20 Pneumococcal vaccine: done 01/25/15 Tdap vaccine: done 11/06/16 Shingles vaccine: Shingrix discussed. Please contact your pharmacy for coverage information.  Covid-19: done 10/24/19 & 11/21/19  Advanced directives: Please bring a copy of your health care power of attorney and living will to the office at your convenience.  Conditions/risks identified: Recommend continuing fall prevention in the home  Next appointment: Follow up in one year for your annual wellness visit    Preventive Care 65 Years and Older, Female Preventive care refers to lifestyle choices and visits with your health care provider that can promote health and wellness. What does preventive care include?  A yearly physical exam. This is also called an annual well check.  Dental exams once or twice a year.  Routine eye exams. Ask your health care provider how often you should have your eyes checked.  Personal lifestyle choices, including:  Daily care of your teeth and gums.  Regular physical activity.  Eating a healthy diet.  Avoiding tobacco and drug use.  Limiting alcohol use.  Practicing safe sex.  Taking low-dose aspirin every day.  Taking vitamin and mineral supplements as recommended by your health care provider. What happens during an annual well check? The services and  screenings done by your health care provider during your annual well check will depend on your age, overall health, lifestyle risk factors, and family history of disease. Counseling  Your health care provider may ask you questions about your:  Alcohol use.  Tobacco use.  Drug use.  Emotional well-being.  Home and relationship well-being.  Sexual activity.  Eating habits.  History of falls.  Memory and ability to understand (cognition).  Work and work Statistician.  Reproductive health. Screening  You may have the following tests or measurements:  Height, weight, and BMI.  Blood pressure.  Lipid and cholesterol levels. These may be checked every 5 years, or more frequently if you are over 63 years old.  Skin check.  Lung cancer screening. You may have this screening every year starting at age 27 if you have a 30-pack-year history of smoking and currently smoke or have quit within the past 15 years.  Fecal occult blood test (FOBT) of the stool. You may have this test every year starting at age 85.  Flexible sigmoidoscopy or colonoscopy. You may have a sigmoidoscopy every 5 years or a colonoscopy every 10 years starting at age 50.  Hepatitis C blood test.  Hepatitis B blood test.  Sexually transmitted disease (STD) testing.  Diabetes screening. This is done by checking your blood sugar (glucose) after you have not eaten for a while (fasting). You may have this done every 1-3 years.  Bone density scan. This is done to screen for osteoporosis. You may have this done starting at age 52.  Mammogram. This may be done every 1-2 years. Talk to your health care provider about how often you should have regular mammograms. Talk with your health care provider about your  test results, treatment options, and if necessary, the need for more tests. Vaccines  Your health care provider may recommend certain vaccines, such as:  Influenza vaccine. This is recommended every  year.  Tetanus, diphtheria, and acellular pertussis (Tdap, Td) vaccine. You may need a Td booster every 10 years.  Zoster vaccine. You may need this after age 10.  Pneumococcal 13-valent conjugate (PCV13) vaccine. One dose is recommended after age 67.  Pneumococcal polysaccharide (PPSV23) vaccine. One dose is recommended after age 74. Talk to your health care provider about which screenings and vaccines you need and how often you need them. This information is not intended to replace advice given to you by your health care provider. Make sure you discuss any questions you have with your health care provider. Document Released: 09/28/2015 Document Revised: 05/21/2016 Document Reviewed: 07/03/2015 Elsevier Interactive Patient Education  2017 [place] Prevention in the Home Falls can cause injuries. They can happen to people of all ages. There are many things you can do to make your home safe and to help prevent falls. What can I do on the outside of my home?  Regularly fix the edges of walkways and driveways and fix any cracks.  Remove anything that might make you trip as you walk through a door, such as a raised step or threshold.  Trim any bushes or trees on the path to your home.  Use bright outdoor lighting.  Clear any walking paths of anything that might make someone trip, such as rocks or tools.  Regularly check to see if handrails are loose or broken. Make sure that both sides of any steps have handrails.  Any raised decks and porches should have guardrails on the edges.  Have any leaves, snow, or ice cleared regularly.  Use sand or salt on walking paths during winter.  Clean up any spills in your garage right away. This includes oil or grease spills. What can I do in the bathroom?  Use night lights.  Install grab bars by the toilet and in the tub and shower. Do not use towel bars as grab bars.  Use non-skid mats or decals in the tub or shower.  If you  need to sit down in the shower, use a plastic, non-slip stool.  Keep the floor dry. Clean up any water that spills on the floor as soon as it happens.  Remove soap buildup in the tub or shower regularly.  Attach bath mats securely with double-sided non-slip rug tape.  Do not have throw rugs and other things on the floor that can make you trip. What can I do in the bedroom?  Use night lights.  Make sure that you have a light by your bed that is easy to reach.  Do not use any sheets or blankets that are too big for your bed. They should not hang down onto the floor.  Have a firm chair that has side arms. You can use this for support while you get dressed.  Do not have throw rugs and other things on the floor that can make you trip. What can I do in the kitchen?  Clean up any spills right away.  Avoid walking on wet floors.  Keep items that you use a lot in easy-to-reach places.  If you need to reach something above you, use a strong step stool that has a grab bar.  Keep electrical cords out of the way.  Do not use floor polish or wax that  makes floors slippery. If you must use wax, use non-skid floor wax.  Do not have throw rugs and other things on the floor that can make you trip. What can I do with my stairs?  Do not leave any items on the stairs.  Make sure that there are handrails on both sides of the stairs and use them. Fix handrails that are broken or loose. Make sure that handrails are as long as the stairways.  Check any carpeting to make sure that it is firmly attached to the stairs. Fix any carpet that is loose or worn.  Avoid having throw rugs at the top or bottom of the stairs. If you do have throw rugs, attach them to the floor with carpet tape.  Make sure that you have a light switch at the top of the stairs and the bottom of the stairs. If you do not have them, ask someone to add them for you. What else can I do to help prevent falls?  Wear shoes  that:  Do not have high heels.  Have rubber bottoms.  Are comfortable and fit you well.  Are closed at the toe. Do not wear sandals.  If you use a stepladder:  Make sure that it is fully opened. Do not climb a closed stepladder.  Make sure that both sides of the stepladder are locked into place.  Ask someone to hold it for you, if possible.  Clearly mark and make sure that you can see:  Any grab bars or handrails.  First and last steps.  Where the edge of each step is.  Use tools that help you move around (mobility aids) if they are needed. These include:  Canes.  Walkers.  Scooters.  Crutches.  Turn on the lights when you go into a dark area. Replace any light bulbs as soon as they burn out.  Set up your furniture so you have a clear path. Avoid moving your furniture around.  If any of your floors are uneven, fix them.  If there are any pets around you, be aware of where they are.  Review your medicines with your doctor. Some medicines can make you feel dizzy. This can increase your chance of falling. Ask your doctor what other things that you can do to help prevent falls. This information is not intended to replace advice given to you by your health care provider. Make sure you discuss any questions you have with your health care provider. Document Released: 06/28/2009 Document Revised: 02/07/2016 Document Reviewed: 10/06/2014 Elsevier Interactive Patient Education  2017 [employer].

## 2020-08-08 ENCOUNTER — Telehealth: Payer: Self-pay

## 2020-08-08 NOTE — Telephone Encounter (Signed)
Pt is requesting a statement/letter stating why she is needing therapy for her legs. Please send to [facility] [phone]) [phone]

## 2020-08-13 ENCOUNTER — Telehealth: Payer: Self-pay | Admitting: Family Medicine

## 2020-08-13 DIAGNOSIS — M25561 Pain in right knee: Secondary | ICD-10-CM | POA: Diagnosis not present

## 2020-08-13 DIAGNOSIS — G5722 Lesion of femoral nerve, left lower limb: Secondary | ICD-10-CM | POA: Diagnosis not present

## 2020-08-13 DIAGNOSIS — Z96651 Presence of right artificial knee joint: Secondary | ICD-10-CM | POA: Diagnosis not present

## 2020-08-13 DIAGNOSIS — M25461 Effusion, right knee: Secondary | ICD-10-CM | POA: Diagnosis not present

## 2020-08-13 NOTE — Telephone Encounter (Signed)
Patient is calling to request a referral for therapy.  She stated that she was told it would have to come from her PCP.  Please call patient to discuss at [phone]

## 2020-08-17 ENCOUNTER — Telehealth: Payer: Self-pay

## 2020-08-17 NOTE — Telephone Encounter (Signed)
Copied from [place] [id number]. Topic: General - Other >> Aug 17, 2020 12:45 PM Hinda Lenis D wrote: Pt needs to speak with Adriel Kessen in regards OT paperwork / please advise >> Aug 17, 2020  2:49 PM Karene Fry wrote: Please call pt to discuss further. Please check messages before this message

## 2020-08-20 NOTE — Telephone Encounter (Signed)
lvm and left the message by sowles

## 2020-08-21 DIAGNOSIS — L03115 Cellulitis of right lower limb: Secondary | ICD-10-CM | POA: Diagnosis not present

## 2020-08-26 DIAGNOSIS — L03115 Cellulitis of right lower limb: Secondary | ICD-10-CM | POA: Diagnosis not present

## 2020-08-26 DIAGNOSIS — E1140 Type 2 diabetes mellitus with diabetic neuropathy, unspecified: Secondary | ICD-10-CM | POA: Diagnosis not present

## 2020-08-26 DIAGNOSIS — R2681 Unsteadiness on feet: Secondary | ICD-10-CM | POA: Diagnosis not present

## 2020-08-27 DIAGNOSIS — E1142 Type 2 diabetes mellitus with diabetic polyneuropathy: Secondary | ICD-10-CM | POA: Diagnosis not present

## 2020-08-27 DIAGNOSIS — B351 Tinea unguium: Secondary | ICD-10-CM | POA: Diagnosis not present

## 2020-08-27 DIAGNOSIS — I739 Peripheral vascular disease, unspecified: Secondary | ICD-10-CM | POA: Diagnosis not present

## 2020-08-28 ENCOUNTER — Other Ambulatory Visit: Payer: Self-pay

## 2020-08-28 ENCOUNTER — Encounter: Payer: Self-pay | Admitting: Family Medicine

## 2020-08-28 ENCOUNTER — Ambulatory Visit (INDEPENDENT_AMBULATORY_CARE_PROVIDER_SITE_OTHER): Payer: BC Managed Care – PPO | Admitting: Family Medicine

## 2020-08-28 VITALS — BP 130/80 | HR 76 | Temp 97.6°F | Resp 16 | Ht 71.0 in | Wt 270.0 lb

## 2020-08-28 DIAGNOSIS — I272 Pulmonary hypertension, unspecified: Secondary | ICD-10-CM | POA: Diagnosis not present

## 2020-08-28 DIAGNOSIS — M009 Pyogenic arthritis, unspecified: Secondary | ICD-10-CM

## 2020-08-28 DIAGNOSIS — I82511 Chronic embolism and thrombosis of right femoral vein: Secondary | ICD-10-CM

## 2020-08-28 DIAGNOSIS — I10 Essential (primary) hypertension: Secondary | ICD-10-CM

## 2020-08-28 DIAGNOSIS — R269 Unspecified abnormalities of gait and mobility: Secondary | ICD-10-CM

## 2020-08-28 DIAGNOSIS — M159 Polyosteoarthritis, unspecified: Secondary | ICD-10-CM

## 2020-08-28 DIAGNOSIS — E785 Hyperlipidemia, unspecified: Secondary | ICD-10-CM

## 2020-08-28 DIAGNOSIS — I959 Hypotension, unspecified: Secondary | ICD-10-CM

## 2020-08-28 DIAGNOSIS — L03115 Cellulitis of right lower limb: Secondary | ICD-10-CM | POA: Diagnosis not present

## 2020-08-28 DIAGNOSIS — E1140 Type 2 diabetes mellitus with diabetic neuropathy, unspecified: Secondary | ICD-10-CM

## 2020-08-28 DIAGNOSIS — N183 Chronic kidney disease, stage 3 unspecified: Secondary | ICD-10-CM

## 2020-08-28 DIAGNOSIS — R202 Paresthesia of skin: Secondary | ICD-10-CM

## 2020-08-28 DIAGNOSIS — S7412XA Injury of femoral nerve at hip and thigh level, left leg, initial encounter: Secondary | ICD-10-CM | POA: Insufficient documentation

## 2020-08-28 DIAGNOSIS — M8949 Other hypertrophic osteoarthropathy, multiple sites: Secondary | ICD-10-CM

## 2020-08-28 DIAGNOSIS — Z96653 Presence of artificial knee joint, bilateral: Secondary | ICD-10-CM

## 2020-08-28 DIAGNOSIS — G4733 Obstructive sleep apnea (adult) (pediatric): Secondary | ICD-10-CM

## 2020-08-28 MED ORDER — MIDODRINE HCL 2.5 MG PO TABS
2.5000 mg | ORAL_TABLET | Freq: Three times a day (TID) | ORAL | 2 refills | Status: DC
Start: 2020-08-28 — End: 2020-09-14

## 2020-08-28 MED ORDER — METOPROLOL SUCCINATE ER 25 MG PO TB24: 25.0000 mg | ORAL_TABLET | Freq: Every day | ORAL | 0 refills | Status: DC

## 2020-08-28 NOTE — Progress Notes (Signed)
Name: Becky Trevino   MRN: [id number]    DOB: [birth year]   Date:08/28/2020       Progress Note  Subjective  Chief Complaint  Follow Up  HPI  DMII with renal manifestation and obesity: she has urinary frequency from urinary incontinence, denies polyphagia or polydipsia. A1C is normal, last urine micro was normal also. Avoiding sweets   Obesity Morbid: she has been unable to be active, too many orthopedic problems, we will order PT evaluation, she has been able to transfer an use a walker for very short distance inside her house, otherwise using wheelchair. Referral to PT in home placed again today   Hypotension:bp has dropped and she is on Midodrine . She is down to 50 mg of Metoprolol  and if heart rate remains at goal, we will try gradually going down on the dose to be able to stop Midodrine.   Chronic DVT  right popliteal vein: diagnosed 10/27/2019, she was on Xarelto and at one time Eliquis  but stopped secondary to iliopsoas hematoma, she still has some swelling of right thigh, she has an IV filter. She has been wearing compression stocking hoses and pump and seems to help with symptoms a little. No pain on right leg  Hyperlipidemia: taking Atorvastatin daily and denies side effects. No chest pain , no muscles pain.Unchanged   Insomnia/OSA: She has been using a hospital bed and unable to wear CPAP lately, sleeping well and not taking trazodone.  Last Echo done by Dr. Clayborn Bigness showed mild pulmonary hypertension. Unchanged   OA: multi joints, sees Ortho, Dr. Rudene Christians . She had revision of right knee replacement March 2019 because of infection. Still under the care of Ortho now also seeing Dr. Ola Spurr - ID ,no redness on right knee but has swelling on right posterior thigh since admission to [facility], secondary to DVT  She is still on daily antibiotics   Left femoral nerve palsy:  seen by Dr. Rudene Christians, Dr. Manuella Ghazi, now seeing neurosurgeon at [facility] , Dr. Tamala Julian. She is able to walk short  distances but s wearing brace because left knee is unstable due to femoral nerve palsy. She still  needs assistance bathing, cannot drive, cannot transfer alone . She was diagnosed with a hematoma of left iliopsoas during hospital stay 10/2019 , but she had repeat  CT pelvis 01/09/2020 that showed resolution of hematoma. She is now off Eliquis and on aspirin only due to history of hematoma Dr. Tamala Julian has discussed with patient and called me about the possibility of nerve transplant from right leg to left to help function, however her is not sure if it will work due to severe edema right and NCS unable to see if functional nerve She would like to resume PT and we will place an order today   Conclusion: done at 08/07/2020 at [place]   This is an abnormal, technically complicated study due to the  presence of significant lower extremity edema and morbid obesity.  There is electrodiagnostic evidence most consistent with a  severe left femoral neuropathy, with active denervation and no  evidence of reinnervation in the vastus medialis, vastus  lateralis, or rectus femoris. The abnormal sensory and motor  responses in the lower extremities may attribute to an underlying  sensorimotor neuropathy, or may be due to to technical factors  related to edema/body habitus.     Patient Active Problem List   Diagnosis Date Noted  . Injury of left femoral nerve 08/28/2020  . History of DVT (  deep vein thrombosis) 06/25/2020  . Lymphedema 06/25/2020  . History of pelvic hematoma 02/10/2020  . Foot ulcer, right, limited to breakdown of skin ([place]) 12/02/2019  . Bacteremia due to group B Streptococcus 10/30/2019  . Chronic venous hypertension due to deep vein thrombosis (DVT) 10/30/2019  . Hematoma   . Anemia 08/07/2019  . Chronic infection of right knee ([place]) 11/25/2018  . Infection and inflammatory reaction due to internal joint prosthesis, subsequent encounter 01/25/2018  . Infection of total right knee  replacement ([place]) 11/19/2017  . Type 2 diabetes mellitus with diabetic neuropathy, without long-term current use of insulin ([place]) 11/06/2016  . Primary osteoarthritis of knee 03/11/2016  . Primary osteoarthritis of left hip 12/11/2015  . Benign essential HTN 04/21/2015  . Edema leg 04/21/2015  . Chronic kidney disease (CKD), stage III (moderate) ([place]) 04/21/2015  . Chronic venous insufficiency 04/21/2015  . DDD (degenerative disc disease), cervical 04/21/2015  . Diabetes mellitus with renal manifestation ([place]) 04/21/2015  . Dyslipidemia 04/21/2015  . Bladder cystocele 04/21/2015  . Urinary incontinence 04/21/2015  . Leg varices 04/21/2015  . Insomnia 04/21/2015  . Eczema intertrigo 04/21/2015  . Obstructive apnea 04/21/2015  . Menopausal and perimenopausal disorder 04/21/2015  . Supraventricular premature beats 04/21/2015  . Osteoarthritis of multiple joints 04/21/2015  . H/O Spinal surgery 11/14/2013  . Detrusor muscle hypertonia 11/07/2013    Past Surgical History:  Procedure Laterality Date  . ABDOMINAL HYSTERECTOMY  1993   Total  . BUNIONECTOMY Bilateral 1993  . I & D KNEE WITH POLY EXCHANGE Right 11/19/2017   Procedure: RIGHT KNEE POLY EXCHANGE WITH IRRIGATION AND DEBRIDEMENT;  Surgeon: Hessie Knows, MD;  Location: [facility];  Service: Orthopedics;  Laterality: Right;  . JOINT REPLACEMENT     bilateral knee  . LAMINECTOMY  11/14/2013    Cervical Fusion , [facility], Dr. Delilah Shan  . LASER ABLATION Bilateral 07/29/2012   Dr. Lucky Cowboy  . LOWER EXTREMITY ANGIOGRAPHY Right 11/02/2019   Procedure: Lower Extremity Angiography;  Surgeon: Katha Cabal, MD;  Location: [place] [facility];  Service: Cardiovascular;  Laterality: Right;  . ORIF ANKLE FRACTURE Right 09/02/2019   Procedure: OPEN REDUCTION INTERNAL FIXATION (ORIF) ANKLE FRACTURE BIMALLEOLAR;  Surgeon: [name], DPM;  Location: [facility];  Service: Podiatry;  Laterality: Right;  . PERIPHERAL VASCULAR THROMBECTOMY Right  03/02/2020   Procedure: PERIPHERAL VASCULAR THROMBECTOMY;  Surgeon: Algernon Huxley, MD;  Location: [place] [facility];  Service: Cardiovascular;  Laterality: Right;  . REPLACEMENT TOTAL KNEE Left 03/2010   Dr. Rudene Christians  . TOTAL HIP ARTHROPLASTY Left 12/11/2015   Procedure: TOTAL HIP ARTHROPLASTY ANTERIOR APPROACH;  Surgeon: Hessie Knows, MD;  Location: [facility];  Service: Orthopedics;  Laterality: Left;  . TOTAL KNEE ARTHROPLASTY Right 03/11/2016   Procedure: TOTAL KNEE ARTHROPLASTY;  Surgeon: Hessie Knows, MD;  Location: [facility];  Service: Orthopedics;  Laterality: Right;    Family History  Problem Relation Age of Onset  . Diabetes Mother   . Hyperlipidemia Mother   . Hypertension Mother   . Diabetes Father   . Hyperlipidemia Father   . Hypertension Father   . Obesity Father   . Hypertension Sister   . Hyperlipidemia Sister   . Hyperlipidemia Sister   . Breast cancer Neg Hx     Social History   Tobacco Use  . Smoking status: Never Smoker  . Smokeless tobacco: Never Used  Substance Use Topics  . Alcohol use: No    Alcohol/week: 0.0 standard drinks  Current Outpatient Medications:  .  ACCU-CHEK GUIDE test strip, CHECK FASTING BLOOD SUGAR ONCE DAILY AND UP TO 4 TIMES DAILY AS DIRECTED, Disp: 100 strip, Rfl: 12 .  acetaminophen (TYLENOL) 650 MG CR tablet, Take 650-1,300 mg by mouth 2 (two) times daily as needed for pain., Disp: , Rfl:  .  aspirin 81 MG EC tablet, TAKE 1 TABLET BY MOUTH EVERY DAY, Disp: 90 tablet, Rfl: 1 .  atorvastatin (LIPITOR) 40 MG tablet, Take 1 tablet (40 mg total) by mouth every evening., Disp: 90 tablet, Rfl: 1 .  blood glucose meter kit and supplies, Dispense based on patient and insurance preference. Use up to four times daily as directed. (FOR ICD-10 E10.9, E11.9)., Disp: 1 each, Rfl: 0 .  Calcium Carbonate-Vitamin D 600-400 MG-UNIT tablet, Take 1 tablet by mouth 2 (two) times daily., Disp: , Rfl:  .  doxycycline (VIBRAMYCIN) 100 MG capsule, Take 100 mg  by mouth 2 (two) times daily., Disp: , Rfl:  .  fluticasone (FLONASE) 50 MCG/ACT nasal spray, Place 2 sprays into both nostrils daily., Disp: 16 g, Rfl: 2 .  furosemide (LASIX) 20 MG tablet, Take 20 mg by mouth every evening. , Disp: , Rfl:  .  imipramine (TOFRANIL) 25 MG tablet, TAKE 1 TABLET BY MOUTH EVERYDAY AT BEDTIME, Disp: 90 tablet, Rfl: 3 .  ketoconazole (NIZORAL) 2 % cream, Apply 1 application topically 2 (two) times daily as needed (rash). , Disp: , Rfl:  .  latanoprost (XALATAN) 0.005 % ophthalmic solution, SMARTSIG:In Eye(s), Disp: , Rfl:  .  Magnesium Oxide 500 MG CAPS, Take 500 mg by mouth daily. , Disp: , Rfl:  .  mirabegron ER (MYRBETRIQ) 50 MG TB24 tablet, Take 1 tablet (50 mg total) by mouth daily., Disp: 90 tablet, Rfl: 3 .  potassium chloride (KLOR-CON) 10 MEQ tablet, Take 10 mEq by mouth daily., Disp: , Rfl:  .  SIMBRINZA 1-0.2 % SUSP, Place 1 drop into both eyes 2 (two) times daily., Disp: , Rfl:  .  metoprolol succinate (TOPROL-XL) 25 MG 24 hr tablet, Take 1 tablet (25 mg total) by mouth daily., Disp: 90 tablet, Rfl: 0 .  midodrine (PROAMATINE) 2.5 MG tablet, Take 1 tablet (2.5 mg total) by mouth 3 (three) times daily with meals., Disp: 90 tablet, Rfl: 2  Allergies  Allergen Reactions  . Lisinopril Swelling    I personally reviewed active problem list, medication list, allergies, family history, social history, health maintenance with the patient/caregiver today.   ROS  Constitutional: Negative for fever or weight change.  Respiratory: Negative for cough and shortness of breath.   Cardiovascular: Negative for chest pain or palpitations.  Gastrointestinal: Negative for abdominal pain, no bowel changes.  Musculoskeletal: positive for gait problem and bilateral joint swelling.  Skin: Negative for rash.  Neurological: Negative for dizziness or headache.  No other specific complaints in a complete review of systems (except as listed in HPI  above).  Objective  Vitals:   08/28/20 1327  BP: 130/80  Pulse: 76  Resp: 16  Temp: 97.6 F (36.4 C)  TempSrc: Oral  SpO2: 98%  Weight: 270 lb (122.5 kg)  Height: 5' 11"  (1.803 m)    Body mass index is 37.66 kg/m.  Physical Exam  Constitutional: Patient appears well-developed and well-nourished. Obese  No distress.  HEENT: head atraumatic, normocephalic, pupils equal and reactive to light,  neck supple Cardiovascular: Normal rate, regular rhythm and normal heart sounds.  No murmur heard. bilateral thigh  BLE edema. Pulmonary/Chest:  Effort normal and breath sounds normal. No respiratory distress. Abdominal: Soft.  There is no tenderness. Psychiatric: Patient has a normal mood and affect. behavior is normal. Judgment and thought content normal.   Recent Results (from the past 2160 hour(s))  Comprehensive metabolic panel     Status: Abnormal   Collection Time: 07/06/20  9:17 AM  Result Value Ref Range   Sodium 137 135 - 145 mmol/L   Potassium 3.9 3.5 - 5.1 mmol/L   Chloride 102 98 - 111 mmol/L   CO2 28 22 - 32 mmol/L   Glucose, Bld 84 70 - 99 mg/dL    Comment: Glucose reference range applies only to samples taken after fasting for at least 8 hours.   BUN 20 8 - 23 mg/dL   Creatinine, Ser 1.28 (H) 0.44 - 1.00 mg/dL   Calcium 8.9 8.9 - 10.3 mg/dL   Total Protein 8.0 6.5 - 8.1 g/dL   Albumin 3.7 3.5 - 5.0 g/dL   AST 25 15 - 41 U/L   ALT 17 0 - 44 U/L   Alkaline Phosphatase 83 38 - 126 U/L   Total Bilirubin 0.6 0.3 - 1.2 mg/dL   GFR, Estimated 44 (L) >60 mL/min    Comment: (NOTE) Calculated using the CKD-EPI Creatinine Equation (2021)    Anion gap 7 5 - 15    Comment: Performed at [facility], [address] [place], [place] [address]  CBC with Differential/Platelet     Status: Abnormal   Collection Time: 07/06/20  9:17 AM  Result Value Ref Range   WBC 5.1 4.0 - 10.5 K/uL   RBC 3.82 (L) 3.87 - 5.11 MIL/uL   Hemoglobin 11.5 (L) 12.0 - 15.0 g/dL   HCT 35.9  (L) 36.0 - 46.0 %   MCV 94.0 80.0 - 100.0 fL   MCH 30.1 26.0 - 34.0 pg   MCHC 32.0 30.0 - 36.0 g/dL   RDW 17.6 (H) 11.5 - 15.5 %   Platelets 144 (L) 150 - 400 K/uL   nRBC 0.0 0.0 - 0.2 %   Neutrophils Relative % 69 %   Neutro Abs 3.5 1.7 - 7.7 K/uL   Lymphocytes Relative 19 %   Lymphs Abs 1.0 0.7 - 4.0 K/uL   Monocytes Relative 8 %   Monocytes Absolute 0.4 0.1 - 1.0 K/uL   Eosinophils Relative 3 %   Eosinophils Absolute 0.2 0.0 - 0.5 K/uL   Basophils Relative 1 %   Basophils Absolute 0.0 0.0 - 0.1 K/uL   Immature Granulocytes 0 %   Abs Immature Granulocytes 0.02 0.00 - 0.07 K/uL    Comment: Performed at [facility], [address] [place], [place] [address]     PHQ2/9: Depression screen [facility] 2/9 08/28/2020 07/31/2020 01/27/2020 09/23/2019 08/23/2019  Decreased Interest 0 0 0 0 0  Down, Depressed, Hopeless 0 0 0 0 0  PHQ - 2 Score 0 0 0 0 0  Altered sleeping - - 0 0 0  Tired, decreased energy - - 0 0 0  Change in appetite - - 0 0 0  Feeling bad or failure about yourself  - - 0 0 0  Trouble concentrating - - 0 0 0  Moving slowly or fidgety/restless - - 0 0 0  Suicidal thoughts - - 0 0 0  PHQ-9 Score - - 0 0 0  Difficult doing work/chores - - - - -  Some recent data might be hidden    phq 9 is negative  Fall Risk: Fall Risk  08/28/2020 07/31/2020 05/29/2020 01/27/2020 09/23/2019  Falls in the past year? 0 0 0 0 1  Number falls in past yr: 0 0 0 0 0  Comment - - - - -  Injury with Fall? 0 0 0 0 1  Comment - - - - 07/2019 hematoma  Risk for fall due to : - Impaired mobility;Impaired balance/gait - - -  Follow up - Falls prevention discussed - - -    Assessment & Plan  1. Type 2 diabetes mellitus with diabetic neuropathy, without long-term current use of insulin ([place])  Seen by podiatrist   2. Chronic deep vein thrombosis (DVT) of femoral vein of right lower extremity (HCC)   3. Pulmonary hypertension ([place])   4. Dyslipidemia   5. Stage 3 chronic kidney  disease, unspecified whether stage 3a or 3b CKD ([place])   6. Gait difficulty  - Ambulatory referral to Home Health  7. Obstructive apnea   8. Benign essential HTN  - metoprolol succinate (TOPROL-XL) 25 MG 24 hr tablet; Take 1 tablet (25 mg total) by mouth daily.  Dispense: 90 tablet; Refill: 0  9. Primary osteoarthritis involving multiple joints   10. Hypotension, unspecified hypotension type  - midodrine (PROAMATINE) 2.5 MG tablet; Take 1 tablet (2.5 mg total) by mouth 3 (three) times daily with meals.  Dispense: 90 tablet; Refill: 2  11. History of bilateral knee replacement   12. Chronic infection of right knee (HCC)  Under the care of ID  13. Paresthesia of left leg  From femoral nerve

## 2020-09-01 ENCOUNTER — Other Ambulatory Visit: Payer: Self-pay

## 2020-09-01 ENCOUNTER — Encounter: Payer: Self-pay | Admitting: Emergency Medicine

## 2020-09-01 ENCOUNTER — Emergency Department: Payer: BC Managed Care – PPO

## 2020-09-01 ENCOUNTER — Emergency Department
Admission: EM | Admit: 2020-09-01 | Discharge: 2020-09-01 | Disposition: A | Discharge status: Home / Self Care | Payer: BC Managed Care – PPO | Source: Home / Self Care | Attending: Emergency Medicine | Admitting: Emergency Medicine

## 2020-09-01 DIAGNOSIS — R5381 Other malaise: Secondary | ICD-10-CM | POA: Diagnosis not present

## 2020-09-01 DIAGNOSIS — Z79899 Other long term (current) drug therapy: Secondary | ICD-10-CM | POA: Insufficient documentation

## 2020-09-01 DIAGNOSIS — A401 Sepsis due to streptococcus, group B: Secondary | ICD-10-CM | POA: Diagnosis not present

## 2020-09-01 DIAGNOSIS — R6883 Chills (without fever): Secondary | ICD-10-CM

## 2020-09-01 DIAGNOSIS — Z7982 Long term (current) use of aspirin: Secondary | ICD-10-CM | POA: Insufficient documentation

## 2020-09-01 DIAGNOSIS — R6889 Other general symptoms and signs: Secondary | ICD-10-CM | POA: Diagnosis not present

## 2020-09-01 DIAGNOSIS — Z743 Need for continuous supervision: Secondary | ICD-10-CM | POA: Diagnosis not present

## 2020-09-01 DIAGNOSIS — E1140 Type 2 diabetes mellitus with diabetic neuropathy, unspecified: Secondary | ICD-10-CM | POA: Insufficient documentation

## 2020-09-01 DIAGNOSIS — M2550 Pain in unspecified joint: Secondary | ICD-10-CM | POA: Diagnosis not present

## 2020-09-01 DIAGNOSIS — E1122 Type 2 diabetes mellitus with diabetic chronic kidney disease: Secondary | ICD-10-CM | POA: Insufficient documentation

## 2020-09-01 DIAGNOSIS — Z7401 Bed confinement status: Secondary | ICD-10-CM | POA: Diagnosis not present

## 2020-09-01 DIAGNOSIS — R609 Edema, unspecified: Secondary | ICD-10-CM | POA: Diagnosis not present

## 2020-09-01 DIAGNOSIS — Z96653 Presence of artificial knee joint, bilateral: Secondary | ICD-10-CM | POA: Insufficient documentation

## 2020-09-01 DIAGNOSIS — N390 Urinary tract infection, site not specified: Secondary | ICD-10-CM | POA: Diagnosis not present

## 2020-09-01 DIAGNOSIS — I129 Hypertensive chronic kidney disease with stage 1 through stage 4 chronic kidney disease, or unspecified chronic kidney disease: Secondary | ICD-10-CM | POA: Insufficient documentation

## 2020-09-01 DIAGNOSIS — N183 Chronic kidney disease, stage 3 unspecified: Secondary | ICD-10-CM | POA: Insufficient documentation

## 2020-09-01 LAB — BASIC METABOLIC PANEL
Anion gap: 11 (ref 5–15)
BUN: 17 mg/dL (ref 8–23)
CO2: 26 mmol/L (ref 22–32)
Calcium: 9.4 mg/dL (ref 8.9–10.3)
Chloride: 102 mmol/L (ref 98–111)
Creatinine, Ser: 1.25 mg/dL — ABNORMAL HIGH (ref 0.44–1.00)
GFR, Estimated: 45 mL/min — ABNORMAL LOW (ref >60)
Glucose, Bld: 123 mg/dL — ABNORMAL HIGH (ref 70–99)
Potassium: 3.9 mmol/L (ref 3.5–5.1)
Sodium: 139 mmol/L (ref 135–145)

## 2020-09-01 LAB — CBC
HCT: 41.4 % (ref 36.0–46.0)
Hemoglobin: 13.3 g/dL (ref 12.0–15.0)
MCH: 31 pg (ref 26.0–34.0)
MCHC: 32.1 g/dL (ref 30.0–36.0)
MCV: 96.5 fL (ref 80.0–100.0)
Platelets: 176 10*3/uL (ref 150–400)
RBC: 4.29 MIL/uL (ref 3.87–5.11)
RDW: 16.9 % — ABNORMAL HIGH (ref 11.5–15.5)
WBC: 11.9 10*3/uL — ABNORMAL HIGH (ref 4.0–10.5)
nRBC: 0 % (ref 0.0–0.2)

## 2020-09-01 LAB — URINALYSIS, COMPLETE (UACMP) WITH MICROSCOPIC
Bacteria, UA: NONE SEEN
Bilirubin Urine: NEGATIVE
Glucose, UA: NEGATIVE mg/dL
Ketones, ur: NEGATIVE mg/dL
Leukocytes,Ua: NEGATIVE
Nitrite: NEGATIVE
Protein, ur: NEGATIVE mg/dL
Specific Gravity, Urine: 1.015 (ref 1.005–1.030)
pH: 6 (ref 5.0–8.0)

## 2020-09-01 NOTE — ED Provider Notes (Signed)
[facility] Emergency Department Provider Note  ____________________________________________   I have reviewed the triage vital signs and the nursing notes.   HISTORY  Chief Complaint Chills   History limited by: Not Limited   HPI Becky Trevino is a 74 y.o. female who presents to the emergency department today because of concern for chills. The patient states that they occurred earlier today. Felt like her teeth were clattering and her arms were shaking. Took her temperature and it was 99. The patient denies any recent nausea vomiting or diarrhea. No chest pain or sob. No dysuria. At the time of my exam she is complaining of some leg pain but states that is due to sitting in the wheelchair out in the waiting room.    Records reviewed. Per medical record review patient has a history of HLD, HTN, DM.  Past Medical History:  Diagnosis Date   Cervical spondylosis    Chronic kidney disease    Stage 3   DDD (degenerative disc disease), cervical    [facility]   Diabetes mellitus without complication ([place])    Diverticulosis    Hyperlipidemia    Hypertension    Intertrigo    Leg cramps    Leg varices    Obesity    OSA (obstructive sleep apnea)    Symptomatic menopausal or female climacteric states    Syncope     Patient Active Problem List   Diagnosis Date Noted   Injury of left femoral nerve 08/28/2020   History of DVT (deep vein thrombosis) 06/25/2020   Lymphedema 06/25/2020   History of pelvic hematoma 02/10/2020   Foot ulcer, right, limited to breakdown of skin ([place]) 12/02/2019   Bacteremia due to group B Streptococcus 10/30/2019   Chronic venous hypertension due to deep vein thrombosis (DVT) 10/30/2019   Hematoma    Anemia 08/07/2019   Chronic infection of right knee ([place]) 11/25/2018   Infection and inflammatory reaction due to internal joint prosthesis, subsequent encounter 01/25/2018   Infection of total  right knee replacement ([place]) 11/19/2017   Type 2 diabetes mellitus with diabetic neuropathy, without long-term current use of insulin ([place]) 11/06/2016   Primary osteoarthritis of knee 03/11/2016   Primary osteoarthritis of left hip 12/11/2015   Benign essential HTN 04/21/2015   Edema leg 04/21/2015   Chronic kidney disease (CKD), stage III (moderate) ([place]) 04/21/2015   Chronic venous insufficiency 04/21/2015   DDD (degenerative disc disease), cervical 04/21/2015   Diabetes mellitus with renal manifestation ([place]) 04/21/2015   Dyslipidemia 04/21/2015   Bladder cystocele 04/21/2015   Urinary incontinence 04/21/2015   Leg varices 04/21/2015   Insomnia 04/21/2015   Eczema intertrigo 04/21/2015   Obstructive apnea 04/21/2015   Menopausal and perimenopausal disorder 04/21/2015   Supraventricular premature beats 04/21/2015   Osteoarthritis of multiple joints 04/21/2015   H/O Spinal surgery 11/14/2013   [place]or muscle hypertonia 11/07/2013    Past Surgical History:  Procedure Laterality Date   ABDOMINAL HYSTERECTOMY  1993   Total   BUNIONECTOMY Bilateral [place] Right 11/19/2017   Procedure: RIGHT KNEE POLY EXCHANGE WITH IRRIGATION AND DEBRIDEMENT;  Surgeon: Hessie Knows, MD;  Location: [facility];  Service: Orthopedics;  Laterality: Right;   JOINT REPLACEMENT     bilateral knee   LAMINECTOMY  11/14/2013    Cervical Fusion , [facility], Dr. Delilah Shan   LASER ABLATION Bilateral 07/29/2012   Dr. Lucky Cowboy   LOWER EXTREMITY ANGIOGRAPHY Right 11/02/2019  Procedure: Lower Extremity Angiography;  Surgeon: Katha Cabal, MD;  Location: [place] [facility];  Service: Cardiovascular;  Laterality: Right;   ORIF ANKLE FRACTURE Right 09/02/2019   Procedure: OPEN REDUCTION INTERNAL FIXATION (ORIF) ANKLE FRACTURE BIMALLEOLAR;  Surgeon: [name], DPM;  Location: [facility];  Service: Podiatry;  Laterality: Right;   PERIPHERAL VASCULAR THROMBECTOMY  Right 03/02/2020   Procedure: PERIPHERAL VASCULAR THROMBECTOMY;  Surgeon: Algernon Huxley, MD;  Location: [place] [facility];  Service: Cardiovascular;  Laterality: Right;   REPLACEMENT TOTAL KNEE Left 03/2010   Dr. Rudene Christians   TOTAL HIP ARTHROPLASTY Left 12/11/2015   Procedure: TOTAL HIP ARTHROPLASTY ANTERIOR APPROACH;  Surgeon: Hessie Knows, MD;  Location: [facility];  Service: Orthopedics;  Laterality: Left;   TOTAL KNEE ARTHROPLASTY Right 03/11/2016   Procedure: TOTAL KNEE ARTHROPLASTY;  Surgeon: Hessie Knows, MD;  Location: [facility];  Service: Orthopedics;  Laterality: Right;    Prior to Admission medications   Medication Sig Start Date End Date Taking? Authorizing Provider  ACCU-CHEK GUIDE test strip CHECK FASTING BLOOD SUGAR ONCE DAILY AND UP TO 4 TIMES DAILY AS DIRECTED 03/18/20   [name], MD  acetaminophen (TYLENOL) 650 MG CR tablet Take 650-1,300 mg by mouth 2 (two) times daily as needed for pain.    [provider]  aspirin 81 MG EC tablet TAKE 1 TABLET BY MOUTH EVERY DAY 07/08/20   Ancil Boozer, Drue Stager, MD  atorvastatin (LIPITOR) 40 MG tablet Take 1 tablet (40 mg total) by mouth every evening. 05/29/20   [name], MD  blood glucose meter kit and supplies Dispense based on patient and insurance preference. Use up to four times daily as directed. (FOR ICD-10 E10.9, E11.9). 01/27/20   [name], MD  Calcium Carbonate-Vitamin D 600-400 MG-UNIT tablet Take 1 tablet by mouth 2 (two) times daily. 02/01/09   [provider]  doxycycline (VIBRAMYCIN) 100 MG capsule Take 100 mg by mouth 2 (two) times daily. 08/25/19   [provider]  fluticasone (FLONASE) 50 MCG/ACT nasal spray Place 2 sprays into both nostrils daily. 01/27/20   [name], MD  furosemide (LASIX) 20 MG tablet Take 20 mg by mouth every evening.     [provider]  imipramine (TOFRANIL) 25 MG tablet TAKE 1 TABLET BY MOUTH EVERYDAY AT BEDTIME 05/06/20   [name], Larene Beach A, PA-C   ketoconazole (NIZORAL) 2 % cream Apply 1 application topically 2 (two) times daily as needed (rash).  01/26/15   [provider]  latanoprost (XALATAN) 0.005 % ophthalmic solution SMARTSIG:In Eye(s) 07/12/20   [provider]  Magnesium Oxide 500 MG CAPS Take 500 mg by mouth daily.  11/10/07   [provider]  metoprolol succinate (TOPROL-XL) 25 MG 24 hr tablet Take 1 tablet (25 mg total) by mouth daily. 08/28/20   [name], MD  midodrine (PROAMATINE) 2.5 MG tablet Take 1 tablet (2.5 mg total) by mouth 3 (three) times daily with meals. 08/28/20   [name], MD  mirabegron ER (MYRBETRIQ) 50 MG TB24 tablet Take 1 tablet (50 mg total) by mouth daily. 06/28/20   Zara Council A, PA-C  potassium chloride (KLOR-CON) 10 MEQ tablet Take 10 mEq by mouth daily. 08/18/19   [provider]  SIMBRINZA 1-0.2 % SUSP Place 1 drop into both eyes 2 (two) times daily. 10/13/19   [provider]    Allergies Lisinopril  Family History  Problem Relation Age of Onset   Diabetes Mother    Hyperlipidemia Mother  Hypertension Mother    Diabetes Father    Hyperlipidemia Father    Hypertension Father    Obesity Father    Hypertension Sister    Hyperlipidemia Sister    Hyperlipidemia Sister    Breast cancer Neg Hx     Social History Social History   Tobacco Use   Smoking status: Never Smoker   Smokeless tobacco: Never Used  Scientific laboratory technician Use: Never used  Substance Use Topics   Alcohol use: No    Alcohol/week: 0.0 standard drinks   Drug use: No    Review of Systems Constitutional: Positive for chills.  Eyes: No visual changes. ENT: No sore throat. Cardiovascular: Denies chest pain. Respiratory: Denies shortness of breath. Gastrointestinal: No abdominal pain.  No nausea, no vomiting.  No diarrhea.   Genitourinary: Negative for dysuria. Musculoskeletal: Positive for right leg pain. Skin: Negative for  rash. Neurological: Negative for headaches, focal weakness or numbness.  ____________________________________________   PHYSICAL EXAM:  VITAL SIGNS: ED Triage Vitals  Enc Vitals Group     BP 09/01/20 1452 (!) 166/78     Pulse Rate 09/01/20 1452 99     Resp 09/01/20 1452 20     Temp 09/01/20 1452 98.6 F (37 C)     Temp Source 09/01/20 1452 Oral     SpO2 09/01/20 1451 100 %     Weight 09/01/20 1447 280 lb (127 kg)     Height 09/01/20 1447 $RemoveBefor'5\' 11"'[id number]$  (1.803 m)     Head Circumference --      Peak Flow --      Pain Score 09/01/20 1447 0   Constitutional: Alert and oriented.  Eyes: Conjunctivae are normal.  ENT      Head: Normocephalic and atraumatic.      Nose: No congestion/rhinnorhea.      Mouth/Throat: Mucous membranes are moist.      Neck: No stridor. Hematological/Lymphatic/Immunilogical: No cervical lymphadenopathy. Cardiovascular: Normal rate, regular rhythm.  No murmurs, rubs, or gallops.  Respiratory: Normal respiratory effort without tachypnea nor retractions. Breath sounds are clear and equal bilaterally. No wheezes/rales/rhonchi. Gastrointestinal: Soft and non tender. No rebound. No guarding.  Genitourinary: Deferred Musculoskeletal: Normal range of motion in all extremities. No lower extremity edema. Neurologic:  Normal speech and language. No gross focal neurologic deficits are appreciated.  Skin:  Skin is warm, dry and intact. No rash noted. Psychiatric: Mood and affect are normal. Speech and behavior are normal. Patient exhibits appropriate insight and judgment.  ____________________________________________    LABS (pertinent positives/negatives)  CBC wbc 11.9, hgb 13.3, plt 176 BMP wnl except glu 123, cr 1.25 UA not consistent with infection ____________________________________________   EKG  I, Nance Pear, attending physician, personally viewed and interpreted this EKG  EKG Time: 1458 Rate: 100 Rhythm: normal sinus rhythm Axis: left axis  deviation Intervals: qtc 423 QRS: narrow ST changes: no st elevation Impression: abnormal ekg  ____________________________________________    RADIOLOGY  CXR Central vascular congestion with acute airspace disease  ____________________________________________   PROCEDURES  Procedures  ____________________________________________   INITIAL IMPRESSION / ASSESSMENT AND PLAN / ED COURSE  Pertinent labs & imaging results that were available during my care of the patient were reviewed by me and considered in my medical decision making (see chart for details).   Patient presented to the emergency department today because of concerns for chills.  Patient denies any other specific symptoms that would point to a source of infection.  Blood work without any concerning  leukocytosis.  Urine and chest x-ray not consistent with infection.  Patient afebrile here.  At this time I do think is reasonable for patient be discharged home.  Discussed return precautions.  ____________________________________________   FINAL CLINICAL IMPRESSION(S) / ED DIAGNOSES  Final diagnoses:  Chills     Note: This dictation was prepared with Dragon dictation. Any transcriptional errors that result from this process are unintentional     Nance Pear, MD 09/01/20 2225

## 2020-09-01 NOTE — ED Notes (Signed)
[facility] to transport pt to home 

## 2020-09-01 NOTE — Discharge Instructions (Addendum)
Please seek medical attention for any high fevers, chest pain, shortness of breath, change in behavior, persistent vomiting, bloody stool or any other new or concerning symptoms.  

## 2020-09-01 NOTE — ED Notes (Signed)
Urine collected and sent to lab.

## 2020-09-01 NOTE — ED Notes (Signed)
PCXR currently being performed.

## 2020-09-01 NOTE — ED Triage Notes (Signed)
Pt arrived via [facility] from home with reports of chills. Pt states she called EMS because her teeth were chattering and she had the chills.  Pt c/o leg pain also because of being uncomfortable in the wheelchair.

## 2020-09-01 NOTE — ED Notes (Signed)
Pt initially stated that she was unable to move from wheelchair to bed due to her left leg was numb from sitting in wheelchair.  Pt sts that she is normally assisted at home by husband and daughter, when female nurse was asked to come and assist with moving from wheelchair to bed, pt was able to stand and transfer from wheelchair to bed.  [place] placed on transfer for urine sample for UA.

## 2020-09-01 NOTE — ED Notes (Signed)
A pillow was placed under the patient's right leg for comfort. Patient was given a ED phone to talk to her husband.

## 2020-09-02 ENCOUNTER — Emergency Department: Payer: BC Managed Care – PPO

## 2020-09-02 ENCOUNTER — Other Ambulatory Visit: Payer: Self-pay

## 2020-09-02 ENCOUNTER — Inpatient Hospital Stay
Admission: EM | Admit: 2020-09-02 | Discharge: 2020-09-14 | DRG: 871 | Disposition: A | Discharge status: Home Health | Payer: BC Managed Care – PPO | Attending: Internal Medicine | Admitting: Internal Medicine

## 2020-09-02 DIAGNOSIS — Z20822 Contact with and (suspected) exposure to covid-19: Secondary | ICD-10-CM | POA: Diagnosis present

## 2020-09-02 DIAGNOSIS — R7881 Bacteremia: Secondary | ICD-10-CM

## 2020-09-02 DIAGNOSIS — B9562 Methicillin resistant Staphylococcus aureus infection as the cause of diseases classified elsewhere: Secondary | ICD-10-CM

## 2020-09-02 DIAGNOSIS — D649 Anemia, unspecified: Secondary | ICD-10-CM | POA: Diagnosis present

## 2020-09-02 DIAGNOSIS — R0902 Hypoxemia: Secondary | ICD-10-CM | POA: Diagnosis not present

## 2020-09-02 DIAGNOSIS — Z833 Family history of diabetes mellitus: Secondary | ICD-10-CM

## 2020-09-02 DIAGNOSIS — I890 Lymphedema, not elsewhere classified: Secondary | ICD-10-CM | POA: Diagnosis present

## 2020-09-02 DIAGNOSIS — E66812 Obesity, class 2: Secondary | ICD-10-CM

## 2020-09-02 DIAGNOSIS — I959 Hypotension, unspecified: Secondary | ICD-10-CM

## 2020-09-02 DIAGNOSIS — E785 Hyperlipidemia, unspecified: Secondary | ICD-10-CM

## 2020-09-02 DIAGNOSIS — E039 Hypothyroidism, unspecified: Secondary | ICD-10-CM

## 2020-09-02 DIAGNOSIS — N1832 Chronic kidney disease, stage 3b: Secondary | ICD-10-CM

## 2020-09-02 DIAGNOSIS — Z7401 Bed confinement status: Secondary | ICD-10-CM

## 2020-09-02 DIAGNOSIS — L02416 Cutaneous abscess of left lower limb: Secondary | ICD-10-CM | POA: Diagnosis present

## 2020-09-02 DIAGNOSIS — N179 Acute kidney failure, unspecified: Secondary | ICD-10-CM

## 2020-09-02 DIAGNOSIS — I9589 Other hypotension: Secondary | ICD-10-CM | POA: Diagnosis present

## 2020-09-02 DIAGNOSIS — I5033 Acute on chronic diastolic (congestive) heart failure: Secondary | ICD-10-CM

## 2020-09-02 DIAGNOSIS — E1141 Type 2 diabetes mellitus with diabetic mononeuropathy: Secondary | ICD-10-CM | POA: Diagnosis present

## 2020-09-02 DIAGNOSIS — I714 Abdominal aortic aneurysm, without rupture: Secondary | ICD-10-CM | POA: Diagnosis present

## 2020-09-02 DIAGNOSIS — L03116 Cellulitis of left lower limb: Secondary | ICD-10-CM | POA: Diagnosis present

## 2020-09-02 DIAGNOSIS — R000 Tachycardia, unspecified: Secondary | ICD-10-CM

## 2020-09-02 DIAGNOSIS — D696 Thrombocytopenia, unspecified: Secondary | ICD-10-CM | POA: Diagnosis present

## 2020-09-02 DIAGNOSIS — R601 Generalized edema: Secondary | ICD-10-CM

## 2020-09-02 DIAGNOSIS — R5381 Other malaise: Secondary | ICD-10-CM | POA: Diagnosis not present

## 2020-09-02 DIAGNOSIS — G4733 Obstructive sleep apnea (adult) (pediatric): Secondary | ICD-10-CM | POA: Diagnosis present

## 2020-09-02 DIAGNOSIS — J189 Pneumonia, unspecified organism: Secondary | ICD-10-CM

## 2020-09-02 DIAGNOSIS — Z888 Allergy status to other drugs, medicaments and biological substances status: Secondary | ICD-10-CM

## 2020-09-02 DIAGNOSIS — R0689 Other abnormalities of breathing: Secondary | ICD-10-CM | POA: Diagnosis not present

## 2020-09-02 DIAGNOSIS — E441 Mild protein-calorie malnutrition: Secondary | ICD-10-CM | POA: Diagnosis present

## 2020-09-02 DIAGNOSIS — A419 Sepsis, unspecified organism: Secondary | ICD-10-CM | POA: Diagnosis not present

## 2020-09-02 DIAGNOSIS — R531 Weakness: Secondary | ICD-10-CM

## 2020-09-02 DIAGNOSIS — I471 Supraventricular tachycardia: Secondary | ICD-10-CM | POA: Diagnosis present

## 2020-09-02 DIAGNOSIS — Z6839 Body mass index (BMI) 39.0-39.9, adult: Secondary | ICD-10-CM

## 2020-09-02 DIAGNOSIS — I130 Hypertensive heart and chronic kidney disease with heart failure and stage 1 through stage 4 chronic kidney disease, or unspecified chronic kidney disease: Secondary | ICD-10-CM | POA: Diagnosis present

## 2020-09-02 DIAGNOSIS — N183 Chronic kidney disease, stage 3 unspecified: Secondary | ICD-10-CM | POA: Diagnosis present

## 2020-09-02 DIAGNOSIS — N390 Urinary tract infection, site not specified: Secondary | ICD-10-CM

## 2020-09-02 DIAGNOSIS — Z8249 Family history of ischemic heart disease and other diseases of the circulatory system: Secondary | ICD-10-CM

## 2020-09-02 DIAGNOSIS — Z7982 Long term (current) use of aspirin: Secondary | ICD-10-CM

## 2020-09-02 DIAGNOSIS — Z6841 Body Mass Index (BMI) 40.0 and over, adult: Secondary | ICD-10-CM

## 2020-09-02 DIAGNOSIS — Z83438 Family history of other disorder of lipoprotein metabolism and other lipidemia: Secondary | ICD-10-CM

## 2020-09-02 DIAGNOSIS — R059 Cough, unspecified: Secondary | ICD-10-CM

## 2020-09-02 DIAGNOSIS — E669 Obesity, unspecified: Secondary | ICD-10-CM

## 2020-09-02 DIAGNOSIS — Z96642 Presence of left artificial hip joint: Secondary | ICD-10-CM | POA: Diagnosis present

## 2020-09-02 DIAGNOSIS — I10 Essential (primary) hypertension: Secondary | ICD-10-CM | POA: Diagnosis present

## 2020-09-02 DIAGNOSIS — Z86718 Personal history of other venous thrombosis and embolism: Secondary | ICD-10-CM

## 2020-09-02 DIAGNOSIS — Z96653 Presence of artificial knee joint, bilateral: Secondary | ICD-10-CM | POA: Diagnosis present

## 2020-09-02 DIAGNOSIS — Z981 Arthrodesis status: Secondary | ICD-10-CM

## 2020-09-02 DIAGNOSIS — Z79899 Other long term (current) drug therapy: Secondary | ICD-10-CM

## 2020-09-02 DIAGNOSIS — E1169 Type 2 diabetes mellitus with other specified complication: Secondary | ICD-10-CM

## 2020-09-02 DIAGNOSIS — N1831 Chronic kidney disease, stage 3a: Secondary | ICD-10-CM | POA: Diagnosis present

## 2020-09-02 DIAGNOSIS — R17 Unspecified jaundice: Secondary | ICD-10-CM | POA: Diagnosis present

## 2020-09-02 DIAGNOSIS — E1122 Type 2 diabetes mellitus with diabetic chronic kidney disease: Secondary | ICD-10-CM

## 2020-09-02 DIAGNOSIS — A401 Sepsis due to streptococcus, group B: Principal | ICD-10-CM | POA: Diagnosis present

## 2020-09-02 LAB — CBC WITH DIFFERENTIAL/PLATELET
Abs Immature Granulocytes: 0.04 10*3/uL (ref 0.00–0.07)
Basophils Absolute: 0 10*3/uL (ref 0.0–0.1)
Basophils Relative: 0 %
Eosinophils Absolute: 0 10*3/uL (ref 0.0–0.5)
Eosinophils Relative: 0 %
HCT: 33.9 % — ABNORMAL LOW (ref 36.0–46.0)
Hemoglobin: 11 g/dL — ABNORMAL LOW (ref 12.0–15.0)
Immature Granulocytes: 0 %
Lymphocytes Relative: 4 %
Lymphs Abs: 0.4 10*3/uL — ABNORMAL LOW (ref 0.7–4.0)
MCH: 31.3 pg (ref 26.0–34.0)
MCHC: 32.4 g/dL (ref 30.0–36.0)
MCV: 96.3 fL (ref 80.0–100.0)
Monocytes Absolute: 0.3 10*3/uL (ref 0.1–1.0)
Monocytes Relative: 3 %
Neutro Abs: 9.6 10*3/uL — ABNORMAL HIGH (ref 1.7–7.7)
Neutrophils Relative %: 93 %
Platelets: 123 10*3/uL — ABNORMAL LOW (ref 150–400)
RBC: 3.52 MIL/uL — ABNORMAL LOW (ref 3.87–5.11)
RDW: 17.2 % — ABNORMAL HIGH (ref 11.5–15.5)
Smear Review: NORMAL
WBC: 10.4 10*3/uL (ref 4.0–10.5)
nRBC: 0 % (ref 0.0–0.2)

## 2020-09-02 LAB — URINALYSIS, COMPLETE (UACMP) WITH MICROSCOPIC
Bilirubin Urine: NEGATIVE
Glucose, UA: NEGATIVE mg/dL
Ketones, ur: NEGATIVE mg/dL
Leukocytes,Ua: NEGATIVE
Nitrite: NEGATIVE
Protein, ur: 30 mg/dL — AB
RBC / HPF: >50 RBC/hpf — ABNORMAL HIGH (ref 0–5)
Specific Gravity, Urine: 1.023 (ref 1.005–1.030)
pH: 5 (ref 5.0–8.0)

## 2020-09-02 LAB — COMPREHENSIVE METABOLIC PANEL
ALT: 18 U/L (ref 0–44)
AST: 35 U/L (ref 15–41)
Albumin: 3.3 g/dL — ABNORMAL LOW (ref 3.5–5.0)
Alkaline Phosphatase: 82 U/L (ref 38–126)
Anion gap: 12 (ref 5–15)
BUN: 20 mg/dL (ref 8–23)
CO2: 22 mmol/L (ref 22–32)
Calcium: 8.5 mg/dL — ABNORMAL LOW (ref 8.9–10.3)
Chloride: 102 mmol/L (ref 98–111)
Creatinine, Ser: 1.52 mg/dL — ABNORMAL HIGH (ref 0.44–1.00)
GFR, Estimated: 36 mL/min — ABNORMAL LOW (ref >60)
Glucose, Bld: 101 mg/dL — ABNORMAL HIGH (ref 70–99)
Potassium: 3.9 mmol/L (ref 3.5–5.1)
Sodium: 136 mmol/L (ref 135–145)
Total Bilirubin: 1.3 mg/dL — ABNORMAL HIGH (ref 0.3–1.2)
Total Protein: 7.6 g/dL (ref 6.5–8.1)

## 2020-09-02 LAB — LACTIC ACID, PLASMA
Lactic Acid, Venous: 2.9 mmol/L (ref 0.5–1.9)
Lactic Acid, Venous: 2.8 mmol/L (ref 0.5–1.9)

## 2020-09-02 LAB — BRAIN NATRIURETIC PEPTIDE: B Natriuretic Peptide: 224.4 pg/mL — ABNORMAL HIGH (ref 0.0–100.0)

## 2020-09-02 LAB — RESP PANEL BY RT-PCR (FLU A&B, COVID) ARPGX2
Influenza A by PCR: NEGATIVE
Influenza B by PCR: NEGATIVE
SARS Coronavirus 2 by RT PCR: NEGATIVE

## 2020-09-02 MED ORDER — ONDANSETRON HCL 4 MG/2ML IJ SOLN: 4.0000 mg | Freq: Four times a day (QID) | INTRAMUSCULAR | Status: DC | PRN

## 2020-09-02 MED ORDER — SODIUM CHLORIDE 0.9 % IV SOLN: 500.0000 mg | INTRAVENOUS | Status: DC

## 2020-09-02 MED ORDER — SODIUM CHLORIDE 0.9 % IV BOLUS
1000.0000 mL | Freq: Once | INTRAVENOUS | Status: AC
  Administered 2020-09-02: 1000 mL via INTRAVENOUS

## 2020-09-02 MED ORDER — SODIUM CHLORIDE 0.9 % IV SOLN: 2.0000 g | INTRAVENOUS | Status: DC

## 2020-09-02 MED ORDER — ACETAMINOPHEN 325 MG PO TABS
650.0000 mg | ORAL_TABLET | Freq: Four times a day (QID) | ORAL | Status: DC | PRN
  Administered 2020-09-03 – 2020-09-10 (×3): 650 mg via ORAL
  Filled 2020-09-02 (×3): qty 2

## 2020-09-02 MED ORDER — SODIUM CHLORIDE 0.9 % IV SOLN
500.0000 mg | Freq: Once | INTRAVENOUS | Status: AC
  Administered 2020-09-02: 500 mg via INTRAVENOUS
  Filled 2020-09-02: qty 500

## 2020-09-02 MED ORDER — MELATONIN 5 MG PO TABS: 5.0000 mg | ORAL_TABLET | Freq: Every day | ORAL | Status: DC

## 2020-09-02 MED ORDER — ENOXAPARIN SODIUM 100 MG/ML ~~LOC~~ SOLN
0.5000 mg/kg | SUBCUTANEOUS | Status: DC
  Administered 2020-09-02 – 2020-09-08 (×7): 62.5 mg via SUBCUTANEOUS
  Filled 2020-09-02 (×9): qty 1

## 2020-09-02 MED ORDER — ENOXAPARIN SODIUM 40 MG/0.4ML ~~LOC~~ SOLN: 40.0000 mg | SUBCUTANEOUS | Status: DC

## 2020-09-02 MED ORDER — ONDANSETRON HCL 4 MG PO TABS: 4.0000 mg | ORAL_TABLET | Freq: Four times a day (QID) | ORAL | Status: DC | PRN

## 2020-09-02 MED ORDER — SODIUM CHLORIDE 0.9 % IV SOLN
1.0000 g | Freq: Once | INTRAVENOUS | Status: AC
  Administered 2020-09-02: 1 g via INTRAVENOUS
  Filled 2020-09-02: qty 10

## 2020-09-02 MED ORDER — SODIUM CHLORIDE 0.9 % IV SOLN
1.0000 g | Freq: Once | INTRAVENOUS | Status: AC
  Administered 2020-09-03: 1 g via INTRAVENOUS
  Filled 2020-09-02: qty 10

## 2020-09-02 MED ORDER — LACTATED RINGERS IV BOLUS (SEPSIS)
1000.0000 mL | Freq: Once | INTRAVENOUS | Status: AC
  Administered 2020-09-02: 1000 mL via INTRAVENOUS

## 2020-09-02 MED ORDER — ACETAMINOPHEN 650 MG RE SUPP: 650.0000 mg | Freq: Four times a day (QID) | RECTAL | Status: DC | PRN

## 2020-09-02 MED ORDER — ENOXAPARIN SODIUM 80 MG/0.8ML ~~LOC~~ SOLN
0.5000 mg/kg | SUBCUTANEOUS | Status: DC
  Filled 2020-09-02: qty 0.8

## 2020-09-02 MED ORDER — LACTATED RINGERS IV SOLN: INTRAVENOUS | Status: AC

## 2020-09-02 NOTE — ED Notes (Signed)
Pt I&O cathed at this time with assist x3. Pt unable to move LE hardly at all. Husband at bedside states that is normal. Pt is morbidly obese.

## 2020-09-02 NOTE — ED Notes (Signed)
Pt to ED for c/o fever. States that it was 103 at her facility today. Pt states that she also fell, but nothing is hurting her. States that her knee "came out from under me". Pt states that she felt hot today, and that is why she felt like she had a fever. States no other symptoms.

## 2020-09-02 NOTE — H&P (Signed)
History and Physical    Becky Trevino VEH:[id number] DOB: 08-Dec-1945 DOA: 09/02/2020  PCP: [name], MD   Patient coming from: Home.   I have personally briefly reviewed patient's old medical records in [place]  Chief Complaint: Fever, weakness and fall.  HPI: Becky Trevino is a 74 y.o. female with medical history significant of DDD/spondylosis of cervical spine, stage III chronic kidney disease, type II DM, diverticulosis, hyperlipidemia, hypertension, intent trigger, leg cramps, varicose veins of lower extremities, lymphedema, obesity, OSA, syncopal episode, chronic diastolic CHF who is coming to the emergency department via EMS for the second day in a row due to progressively worse chills, rigors, fever, frontal headache, lightheadedness, decreased appetite, fatigue and malaise, which she said started about a week ago on Sunday.  The patient stated that she was having RLE pain due to seeding and her wheelchair.  She tried to get up, but felt that her RLE was too weak to stand and fell.  She denies trauma to her head or any other significant injury from this.  There was no LOC.  EMS reports that the patient was hypotensive and on arrival and was given a liter of IVF prior to arrival to the emergency department.  She denies rhinorrhea, wheezing or hemoptysis.  No chest pain, palpitations, diaphoresis, PND or orthopnea.  She occasionally gets pitting edema of the lower extremities.  She denies abdominal pain, nausea, vomiting, diarrhea, constipation, melena or hematochezia.  No dysuria or hematuria, but gets frequency for urinary incontinence.  No polyuria, polydipsia, polyphagia or blurred vision.  ED Course: Initial vital signs were temperature 99.8 F, pulse 93, respirations 20, BP 115/53 mmHg and O2 sat 96% on room air.  The patient received 1000 mL of normal saline bolus, azithromycin 500 mg IVPB and ceftriaxone 1 g IVPB.  Lab work: Urinalysis hazy with large hemoglobinuria,  proteinuria 30 mg/dL, microscopic urine examination shows RBC more than 50, 6-10 WBC and rare bacteria.  CBC showed a white count of 10.4 with 93% neutrophils, hemoglobin 11.0 g/dL and platelets 123.  CMP showed normal electrolytes and calcium is corrected to albumin.  Glucose 101, BUN 20 and creatinine 1.52 mg/dL, increased from 1.25 mg/dL yesterday.  Albumin was 3.3 g/dL and total bilirubin 1.3 mg/dL (0.6 mg/dL yesterday).  The rest of the hepatic functions were within expected range.  BNP was 224.4 pg/mL.  Lactic acid 2.9 and then 2.8 mmol/L.  SARS coronavirus 2 PCR was negative.  Imaging: Chest radiograph showed cardiomegaly with CHF and questionable underlying infectious process.  A portable pelvis radiograph did not show any acute fracture or dislocation.  There were end-stage degenerative changes of the right hip.  Please see images and full radiology report for further detail  Review of Systems: As per HPI otherwise all other systems reviewed and are negative.  Past Medical History:  Diagnosis Date  . Cervical spondylosis   . Chronic kidney disease    Stage 3  . DDD (degenerative disc disease), cervical    [facility]  . Diabetes mellitus without complication ([place])   . Diverticulosis   . Hyperlipidemia   . Hypertension   . Intertrigo   . Leg cramps   . Leg varices   . Obesity   . OSA (obstructive sleep apnea)   . Symptomatic menopausal or female climacteric states   . Syncope    Past Surgical History:  Procedure Laterality Date  . ABDOMINAL HYSTERECTOMY  1993   Total  . BUNIONECTOMY Bilateral  [place] WITH POLY EXCHANGE Right 11/19/2017   Procedure: RIGHT KNEE POLY EXCHANGE WITH IRRIGATION AND DEBRIDEMENT;  Surgeon: Hessie Knows, MD;  Location: [facility] ORS;  Service: Orthopedics;  Laterality: Right;  . JOINT REPLACEMENT     bilateral knee  . LAMINECTOMY  11/14/2013    Cervical Fusion , [facility], Dr. Delilah Shan  . LASER ABLATION Bilateral 07/29/2012   Dr. Lucky Cowboy  . LOWER  EXTREMITY ANGIOGRAPHY Right 11/02/2019   Procedure: Lower Extremity Angiography;  Surgeon: Katha Cabal, MD;  Location: [place] [facility];  Service: Cardiovascular;  Laterality: Right;  . ORIF ANKLE FRACTURE Right 09/02/2019   Procedure: OPEN REDUCTION INTERNAL FIXATION (ORIF) ANKLE FRACTURE BIMALLEOLAR;  Surgeon: [name], DPM;  Location: [facility];  Service: Podiatry;  Laterality: Right;  . PERIPHERAL VASCULAR THROMBECTOMY Right 03/02/2020   Procedure: PERIPHERAL VASCULAR THROMBECTOMY;  Surgeon: Algernon Huxley, MD;  Location: [place] [facility];  Service: Cardiovascular;  Laterality: Right;  . REPLACEMENT TOTAL KNEE Left 03/2010   Dr. Rudene Christians  . TOTAL HIP ARTHROPLASTY Left 12/11/2015   Procedure: TOTAL HIP ARTHROPLASTY ANTERIOR APPROACH;  Surgeon: Hessie Knows, MD;  Location: [facility];  Service: Orthopedics;  Laterality: Left;  . TOTAL KNEE ARTHROPLASTY Right 03/11/2016   Procedure: TOTAL KNEE ARTHROPLASTY;  Surgeon: Hessie Knows, MD;  Location: [facility];  Service: Orthopedics;  Laterality: Right;   Social History  reports that she has never smoked. She has never used smokeless tobacco. She reports that she does not drink alcohol and does not use drugs.  Allergies  Allergen Reactions  . Lisinopril Swelling   Family History  Problem Relation Age of Onset  . Diabetes Mother   . Hyperlipidemia Mother   . Hypertension Mother   . Diabetes Father   . Hyperlipidemia Father   . Hypertension Father   . Obesity Father   . Hypertension Sister   . Hyperlipidemia Sister   . Hyperlipidemia Sister   . Breast cancer Neg Hx    Prior to Admission medications   Medication Sig Start Date End Date Taking? Authorizing Provider  ACCU-CHEK GUIDE test strip CHECK FASTING BLOOD SUGAR ONCE DAILY AND UP TO 4 TIMES DAILY AS DIRECTED 03/18/20   [name], MD  acetaminophen (TYLENOL) 650 MG CR tablet Take 650-1,300 mg by mouth 2 (two) times daily as needed for pain.    [provider]   aspirin 81 MG EC tablet TAKE 1 TABLET BY MOUTH EVERY DAY 07/08/20   Ancil Boozer, Drue Stager, MD  atorvastatin (LIPITOR) 40 MG tablet Take 1 tablet (40 mg total) by mouth every evening. 05/29/20   [name], MD  blood glucose meter kit and supplies Dispense based on patient and insurance preference. Use up to four times daily as directed. (FOR ICD-10 E10.9, E11.9). 01/27/20   [name], MD  Calcium Carbonate-Vitamin D 600-400 MG-UNIT tablet Take 1 tablet by mouth 2 (two) times daily. 02/01/09   [provider]  doxycycline (VIBRAMYCIN) 100 MG capsule Take 100 mg by mouth 2 (two) times daily. 08/25/19   [provider]  fluticasone (FLONASE) 50 MCG/ACT nasal spray Place 2 sprays into both nostrils daily. 01/27/20   [name], MD  furosemide (LASIX) 20 MG tablet Take 20 mg by mouth every evening.     [provider]  imipramine (TOFRANIL) 25 MG tablet TAKE 1 TABLET BY MOUTH EVERYDAY AT BEDTIME 05/06/20   [name], Larene Beach A, PA-C  ketoconazole (NIZORAL) 2 % cream Apply 1 application topically 2 (  two) times daily as needed (rash).  01/26/15   [provider]  latanoprost (XALATAN) 0.005 % ophthalmic solution SMARTSIG:In Eye(s) 07/12/20   [provider]  Magnesium Oxide 500 MG CAPS Take 500 mg by mouth daily.  11/10/07   [provider]  metoprolol succinate (TOPROL-XL) 25 MG 24 hr tablet Take 1 tablet (25 mg total) by mouth daily. 08/28/20   [name], MD  midodrine (PROAMATINE) 2.5 MG tablet Take 1 tablet (2.5 mg total) by mouth 3 (three) times daily with meals. 08/28/20   [name], MD  mirabegron ER (MYRBETRIQ) 50 MG TB24 tablet Take 1 tablet (50 mg total) by mouth daily. 06/28/20   Zara Council A, PA-C  potassium chloride (KLOR-CON) 10 MEQ tablet Take 10 mEq by mouth daily. 08/18/19   [provider]  SIMBRINZA 1-0.2 % SUSP Place 1 drop into both eyes 2 (two) times daily. 10/13/19   [provider]   Physical  Exam: Vitals:   09/02/20 1538 09/02/20 1639 09/02/20 1700 09/02/20 1730  BP:  126/74 118/67 116/71  Pulse:   90 88  Resp:  19 20 17   Temp:      TempSrc:      SpO2:  97% 97% 98%  Weight: 127 kg     Height: 5' 11"  (1.803 m)      Constitutional: NAD, calm, comfortable Eyes: PERRL, lids and conjunctivae mildly injected. ENMT: Mucous membranes and lips are dry. Posterior pharynx clear of any exudate or lesions. Neck: normal, supple, no masses, no thyromegaly Respiratory: Decreased breath sounds in bases, otherwise clear to auscultation bilaterally, no wheezing, no crackles. Normal respiratory effort. No accessory muscle use.  Cardiovascular: Regular rate and rhythm, no murmurs / rubs / gallops.  Bilateral stage III lymphedema.  No pitting lower extremity edema. 2+ pedal pulses. No carotid bruits.  Abdomen: Obese, nondistended.  Bowel sounds positive.  Soft, no tenderness, no masses palpated. No hepatosplenomegaly. Musculoskeletal: Generalized weakness.  No clubbing / cyanosis.  Good ROM, no contractures. Normal muscle tone.  Skin: no rashes, lesions, ulcers on very limited dermatological examination. Neurologic: CN 2-12 grossly intact. Sensation intact, DTR normal. Strength 5/5 in all 4.  Psychiatric: Normal judgment and insight. Alert and oriented x 3. Normal mood.   Labs on Admission: I have personally reviewed following labs and imaging studies  CBC: Recent Labs  Lab 09/01/20 1503 09/02/20 1542  WBC 11.9* 10.4  NEUTROABS  --  9.6*  HGB 13.3 11.0*  HCT 41.4 33.9*  MCV 96.5 96.3  PLT 176 123*    Basic Metabolic Panel: Recent Labs  Lab 09/01/20 1503 09/02/20 1542  NA 139 136  K 3.9 3.9  CL 102 102  CO2 26 22  GLUCOSE 123* 101*  BUN 17 20  CREATININE 1.25* 1.52*  CALCIUM 9.4 8.5*    GFR: Estimated Creatinine Clearance: 47.8 mL/min (A) (by C-G formula based on SCr of 1.52 mg/dL (H)).  Liver Function Tests: Recent Labs  Lab 09/02/20 1542  AST 35  ALT 18  ALKPHOS  82  BILITOT 1.3*  PROT 7.6  ALBUMIN 3.3*   Urine analysis:    Component Value Date/Time   COLORURINE YELLOW (A) 09/02/2020 1725   APPEARANCEUR HAZY (A) 09/02/2020 1725   LABSPEC 1.023 09/02/2020 1725   PHURINE 5.0 09/02/2020 1725   GLUCOSEU NEGATIVE 09/02/2020 1725   HGBUR LARGE (A) 09/02/2020 1725   BILIRUBINUR NEGATIVE 09/02/2020 [place] 09/02/2020 1725   PROTEINUR 30 (A) 09/02/2020 1725  NITRITE NEGATIVE 09/02/2020 [place] 09/02/2020 1725   Radiological Exams on Admission: DG Chest 2 View  Result Date: 09/02/2020 CLINICAL DATA:  Fever EXAM: CHEST - 2 VIEW COMPARISON:  September 01, 2020 FINDINGS: Again noted is cardiomegaly with prominent interstitial lung markings and findings suggestive of congestive heart failure. The lung volumes are somewhat low. There are probable small bilateral pleural effusions. There are end-stage degenerative changes of the right shoulder. IMPRESSION: Cardiomegaly with persistent congestive heart failure. An underlying atypical infectious process is not entirely excluded. Electronically Signed   By: Constance Holster M.D.   On: 09/02/2020 17:36   DG Pelvis Portable  Result Date: 09/02/2020 CLINICAL DATA:  Fall. EXAM: PORTABLE PELVIS 1-2 VIEWS COMPARISON:  December 11, 2015 FINDINGS: The patient is status post total hip arthroplasty on the left. There are end-stage degenerative changes of the right hip. There is no evidence for an acute displaced fracture or dislocation. IMPRESSION: 1. No acute displaced fracture or dislocation. 2. End-stage degenerative changes of the right hip. Electronically Signed   By: Constance Holster M.D.   On: 09/02/2020 17:35   DG Chest Portable 1 View  Result Date: 09/01/2020 CLINICAL DATA:  Chills, leg pain EXAM: PORTABLE CHEST 1 VIEW COMPARISON:  10/27/2019 FINDINGS: Single frontal view of the chest demonstrates a stable cardiac silhouette. Central vascular congestion without airspace  disease, effusion, or pneumothorax. Chronic elevation of the right hemidiaphragm. No acute bony abnormalities. IMPRESSION: 1. Central vascular congestion without airspace disease. Electronically Signed   By: Randa Ngo M.D.   On: 09/01/2020 21:16   EKG: Independently reviewed. Vent. rate 93 BPM PR interval 192 ms QRS duration 88 ms QT/QTc 348/432 ms P-R-T axes 63 27 65 Normal sinus rhythm Nonspecific ST and T wave abnormality Abnormal ECG  Assessment/Plan Principal Problem:   Sepsis due to undetermined organism POA ([place]) Suspicious for respiratory/pneumonia etiology. Observation/progressive unit. Continue IV fluids. Follow lactic acid level. Antitussives as needed. Bronchodilators as needed. Continue ceftriaxone 2 g IVPB every 24 hours. Continue azithromycin 500 mg IVPB every 24 hours. Obtain strep pneumoniae urinary antigen. Follow-up blood cultures and sensitivity.  Active Problems:   Benign essential HTN Hold metoprolol. Also on midodrine due to occasional hypotension. Monitor blood pressure.    Chronic kidney disease (CKD), stage III (moderate) (HCC) Creatinine was 1.25 mg/dL yesterday and 1.52 mg/dL today. Yesterday, GFR was 45 and 2 days 36 mL/min. Time-limited IV hydration overnight. Monitor intake and output. Follow renal function electrolytes.    Type 2 diabetes mellitus with diabetic neuropathy, without long-term current use of insulin (HCC) Hemoglobin A1c 5.5% 3 months ago. Carbohydrate modified diet CBG monitoring with [place] SS.    Dyslipidemia Hold atorvastatin (hyperbilirubinemia). Follow-up bilirubin level in AM.    Normocytic anemia Normal anemia panel a year ago. Monitor hematocrit and hemoglobin.    Thrombocytopenia (HCC) Monitor platelet count. Hold Lovenox if he goes below 100 K.    Mild protein-calorie malnutrition ([place]) In the setting of acute infection. Nutritional services evaluation    Hyperbilirubinemia Increased from 0.6 to 1.3  mg/dL. Continue IV hydration. Follow-up bilirubin/liver function tests in a.m. Further work-up depending on repeat labs.    OSA (obstructive sleep apnea) CPAP at bedtime.   DVT prophylaxis:  Lovenox SQ. Code Status:                    Full code.  Family Communication: Disposition Plan:   Patient is from:  Home.  Anticipated DC to:  Home.  Anticipated DC date:  09/02/2020.  Anticipated DC barriers: Clinical status.  Consults called: Admission status:  Observation/progressive unit.   Severity of Illness: High due to age, comorbidity, presenting with fever, tachycardia and work-up showing lactic acidosis secondary to sepsis due to undetermined organism pressing on admission.  Reubin Milan MD [facility]  How to contact the [facility] Attending or Consulting provider [place] or covering provider during after hours [place], for this patient?   1. Check the care team in [facility] and look for a) attending/consulting [facility] provider listed and b) the [facility] team listed 2. Log into [email or web address] and use [place]'s universal password to access. If you do not have the password, please contact the hospital operator. 3. Locate the [facility] provider you are looking for under [facility] and page to a number that you can be directly reached. 4. If you still have difficulty reaching the provider, please page the [facility] (Director on Call) for the Hospitalists listed on amion for assistance.  09/02/2020, 8:39 PM   This document was prepared using Dragon voice recognition software and may contain some unintended transcription errors.

## 2020-09-02 NOTE — Progress Notes (Signed)
PHARMACIST - PHYSICIAN COMMUNICATION  CONCERNING:  Enoxaparin (Lovenox) for DVT Prophylaxis    RECOMMENDATION: Patient was prescribed enoxaprin 40mg  q24 hours for VTE prophylaxis.   Filed Weights   09/02/20 1538  Weight: 127 kg (280 lb)    Body mass index is 39.05 kg/m.  Estimated Creatinine Clearance: 47.8 mL/min (A) (by C-G formula based on SCr of 1.52 mg/dL (H)).   Based on [place] patient is candidate for enoxaparin 0.5mg /kg TBW SQ every 24 hours based on BMI being >30.  DESCRIPTION: Pharmacy has adjusted enoxaparin dose per [facility] policy.  Patient is now receiving enoxaparin 0.5 mg/kg every 24 hours   Renda Rolls, PharmD, [facility] 09/02/2020 8:36 PM

## 2020-09-02 NOTE — ED Notes (Signed)
Lab called for urine add on

## 2020-09-02 NOTE — ED Triage Notes (Signed)
Pt to ED via [facility] from home for chief complaint of fever and fall today. Reports max temp 105, 99.8 on arrival Ems reports was hypotensive, 1 L bolus started PTA 18G L hand and 18 G R hand started PTA  Pt states she had a fall today from right leg pain, states pain from sitting in w/c. Denies hitting head

## 2020-09-02 NOTE — ED Provider Notes (Signed)
[facility] Emergency Department Provider Note  Time seen: 5:14 PM  I have reviewed the triage vital signs and the nursing notes.   HISTORY  Chief Complaint Fever and Fall   HPI Becky Trevino is a 74 y.o. female with a past medical history of chronic kidney disease, hypertension, hyperlipidemia, presents to the emergency department after a fall.  According to the patient for the last 2 days she has been feeling weak had low-grade temperature yesterday and 100.5 temperature today by EMS.  Patient states her legs gave out and she fell to the ground today denies hitting her head.  Denies LOC.  Currently denies any leg pain.  Denies any nausea vomiting diarrhea, dysuria.  Denies any chest pain or abdominal pain.  No cough congestion or shortness of breath.  Patient has been vaccinated against Covid with a booster.  No known sick contacts.  Patient was seen in the emergency department yesterday for chills with an extensive work-up performed with negative results.   Past Medical History:  Diagnosis Date  . Cervical spondylosis   . Chronic kidney disease    Stage 3  . DDD (degenerative disc disease), cervical    [facility]  . Diabetes mellitus without complication ([place])   . Diverticulosis   . Hyperlipidemia   . Hypertension   . Intertrigo   . Leg cramps   . Leg varices   . Obesity   . OSA (obstructive sleep apnea)   . Symptomatic menopausal or female climacteric states   . Syncope     Patient Active Problem List   Diagnosis Date Noted  . Injury of left femoral nerve 08/28/2020  . History of DVT (deep vein thrombosis) 06/25/2020  . Lymphedema 06/25/2020  . History of pelvic hematoma 02/10/2020  . Foot ulcer, right, limited to breakdown of skin ([place]) 12/02/2019  . Bacteremia due to group B Streptococcus 10/30/2019  . Chronic venous hypertension due to deep vein thrombosis (DVT) 10/30/2019  . Hematoma   . Anemia 08/07/2019  . Chronic infection of  right knee ([place]) 11/25/2018  . Infection and inflammatory reaction due to internal joint prosthesis, subsequent encounter 01/25/2018  . Infection of total right knee replacement ([place]) 11/19/2017  . Type 2 diabetes mellitus with diabetic neuropathy, without long-term current use of insulin ([place]) 11/06/2016  . Primary osteoarthritis of knee 03/11/2016  . Primary osteoarthritis of left hip 12/11/2015  . Benign essential HTN 04/21/2015  . Edema leg 04/21/2015  . Chronic kidney disease (CKD), stage III (moderate) ([place]) 04/21/2015  . Chronic venous insufficiency 04/21/2015  . DDD (degenerative disc disease), cervical 04/21/2015  . Diabetes mellitus with renal manifestation ([place]) 04/21/2015  . Dyslipidemia 04/21/2015  . Bladder cystocele 04/21/2015  . Urinary incontinence 04/21/2015  . Leg varices 04/21/2015  . Insomnia 04/21/2015  . Eczema intertrigo 04/21/2015  . Obstructive apnea 04/21/2015  . Menopausal and perimenopausal disorder 04/21/2015  . Supraventricular premature beats 04/21/2015  . Osteoarthritis of multiple joints 04/21/2015  . H/O Spinal surgery 11/14/2013  . Detrusor muscle hypertonia 11/07/2013    Past Surgical History:  Procedure Laterality Date  . ABDOMINAL HYSTERECTOMY  1993   Total  . BUNIONECTOMY Bilateral 1993  . I & D KNEE WITH POLY EXCHANGE Right 11/19/2017   Procedure: RIGHT KNEE POLY EXCHANGE WITH IRRIGATION AND DEBRIDEMENT;  Surgeon: Hessie Knows, MD;  Location: [facility] OR[facility];  Service: Orthopedics;  Laterality: Right;  . JOINT REPLACEMENT     bilateral knee  . LAMINECTOMY  11/14/2013  Cervical Fusion , [facility], Dr. Delilah Shan  . LASER ABLATION Bilateral 07/29/2012   Dr. Lucky Cowboy  . LOWER EXTREMITY ANGIOGRAPHY Right 11/02/2019   Procedure: Lower Extremity Angiography;  Surgeon: Katha Cabal, MD;  Location: [place] [facility];  Service: Cardiovascular;  Laterality: Right;  . ORIF ANKLE FRACTURE Right 09/02/2019   Procedure: OPEN REDUCTION INTERNAL FIXATION (ORIF)  ANKLE FRACTURE BIMALLEOLAR;  Surgeon: [name], DPM;  Location: [facility];  Service: Podiatry;  Laterality: Right;  . PERIPHERAL VASCULAR THROMBECTOMY Right 03/02/2020   Procedure: PERIPHERAL VASCULAR THROMBECTOMY;  Surgeon: Algernon Huxley, MD;  Location: [place] [facility];  Service: Cardiovascular;  Laterality: Right;  . REPLACEMENT TOTAL KNEE Left 03/2010   Dr. Rudene Christians  . TOTAL HIP ARTHROPLASTY Left 12/11/2015   Procedure: TOTAL HIP ARTHROPLASTY ANTERIOR APPROACH;  Surgeon: Hessie Knows, MD;  Location: [facility];  Service: Orthopedics;  Laterality: Left;  . TOTAL KNEE ARTHROPLASTY Right 03/11/2016   Procedure: TOTAL KNEE ARTHROPLASTY;  Surgeon: Hessie Knows, MD;  Location: [facility];  Service: Orthopedics;  Laterality: Right;    Prior to Admission medications   Medication Sig Start Date End Date Taking? Authorizing Provider  ACCU-CHEK GUIDE test strip CHECK FASTING BLOOD SUGAR ONCE DAILY AND UP TO 4 TIMES DAILY AS DIRECTED 03/18/20   [name], MD  acetaminophen (TYLENOL) 650 MG CR tablet Take 650-1,300 mg by mouth 2 (two) times daily as needed for pain.    [provider]  aspirin 81 MG EC tablet TAKE 1 TABLET BY MOUTH EVERY DAY 07/08/20   Ancil Boozer, Drue Stager, MD  atorvastatin (LIPITOR) 40 MG tablet Take 1 tablet (40 mg total) by mouth every evening. 05/29/20   [name], MD  blood glucose meter kit and supplies Dispense based on patient and insurance preference. Use up to four times daily as directed. (FOR ICD-10 E10.9, E11.9). 01/27/20   [name], MD  Calcium Carbonate-Vitamin D 600-400 MG-UNIT tablet Take 1 tablet by mouth 2 (two) times daily. 02/01/09   [provider]  doxycycline (VIBRAMYCIN) 100 MG capsule Take 100 mg by mouth 2 (two) times daily. 08/25/19   [provider]  fluticasone (FLONASE) 50 MCG/ACT nasal spray Place 2 sprays into both nostrils daily. 01/27/20   [name], MD  furosemide (LASIX) 20 MG tablet Take 20 mg by mouth every  evening.     [provider]  imipramine (TOFRANIL) 25 MG tablet TAKE 1 TABLET BY MOUTH EVERYDAY AT BEDTIME 05/06/20   [name], Larene Beach A, PA-C  ketoconazole (NIZORAL) 2 % cream Apply 1 application topically 2 (two) times daily as needed (rash).  01/26/15   [provider]  latanoprost (XALATAN) 0.005 % ophthalmic solution SMARTSIG:In Eye(s) 07/12/20   [provider]  Magnesium Oxide 500 MG CAPS Take 500 mg by mouth daily.  11/10/07   [provider]  metoprolol succinate (TOPROL-XL) 25 MG 24 hr tablet Take 1 tablet (25 mg total) by mouth daily. 08/28/20   [name], MD  midodrine (PROAMATINE) 2.5 MG tablet Take 1 tablet (2.5 mg total) by mouth 3 (three) times daily with meals. 08/28/20   [name], MD  mirabegron ER (MYRBETRIQ) 50 MG TB24 tablet Take 1 tablet (50 mg total) by mouth daily. 06/28/20   Zara Council A, PA-C  potassium chloride (KLOR-CON) 10 MEQ tablet Take 10 mEq by mouth daily. 08/18/19   [provider]  SIMBRINZA 1-0.2 % SUSP Place 1 drop into both eyes 2 (two) times daily. 10/13/19   [provider]  Allergies  Allergen Reactions  . Lisinopril Swelling    Family History  Problem Relation Age of Onset  . Diabetes Mother   . Hyperlipidemia Mother   . Hypertension Mother   . Diabetes Father   . Hyperlipidemia Father   . Hypertension Father   . Obesity Father   . Hypertension Sister   . Hyperlipidemia Sister   . Hyperlipidemia Sister   . Breast cancer Neg Hx     Social History Social History   Tobacco Use  . Smoking status: Never Smoker  . Smokeless tobacco: Never Used  Vaping Use  . Vaping Use: Never used  Substance Use Topics  . Alcohol use: No    Alcohol/week: 0.0 standard drinks  . Drug use: No    Review of Systems Constitutional: Subjective fever, 100.5 by EMS.  99.8 currently. ENT: Negative for recent illness/congestion Cardiovascular: Negative for chest pain. Respiratory:  Negative for shortness of breath.  Negative for cough. Gastrointestinal: Negative for abdominal pain, vomiting and diarrhea. Genitourinary: Negative for urinary compaints Musculoskeletal: Negative for musculoskeletal complaints Skin: Negative for skin complaints  Neurological: Negative for headache All other ROS negative  ____________________________________________   PHYSICAL EXAM:  VITAL SIGNS: ED Triage Vitals  Enc Vitals Group     BP 09/02/20 1535 (!) 115/53     Pulse Rate 09/02/20 1535 93     Resp 09/02/20 1535 20     Temp 09/02/20 1535 99.8 F (37.7 C)     Temp Source 09/02/20 1535 Oral     SpO2 09/02/20 1535 96 %     Weight 09/02/20 1538 280 lb (127 kg)     Height 09/02/20 1538 _0  (1.803 m)     Head Circumference --      Peak Flow --      Pain Score 09/02/20 1538 10     Pain Loc --      Pain Edu? --      Excl. in [place]? --    Constitutional: Alert and oriented. Well appearing and in no distress. Eyes: Normal exam ENT      Head: Normocephalic and atraumatic.      Mouth/Throat: Mucous membranes are moist. Cardiovascular: Normal rate, regular rhythm.  Respiratory: Normal respiratory effort without tachypnea nor retractions. Breath sounds are clear Gastrointestinal: Soft and nontender. No distention. Musculoskeletal: Nontender with normal range of motion in all extremities. Neurologic:  Normal speech and language. No gross focal neurologic deficits  Skin:  Skin is warm, dry and intact.  Psychiatric: Mood and affect are normal.   ____________________________________________   RADIOLOGY  Chest x-ray performed yesterday, no acute findings.  ____________________________________________   INITIAL IMPRESSION / ASSESSMENT AND PLAN / ED COURSE  Pertinent labs & imaging results that were available during my care of the patient were reviewed by me and considered in my medical decision making (see chart for details).   Patient presents emergency department for a  fall found to be febrile to 100.5.  Patient had fairly extensive work-up performed yesterday with negative results.  Normal blood work, normal urine, clear chest x-ray.  Patient's labs today does show mild lactate elevation.  We will recheck basic labs, send a Covid swab, IV hydrate and continue to closely monitor.  Patient does state a fall and was complaining of some hip pain earlier per EMS but denies any currently.  Good range of motion in bilateral lower extremities.  We will obtain a pelvis x-ray as a precaution.  Overall the patient appears well.  Borderline low-grade temperature in the emergency department 99.8 otherwise reassuring vitals. Patient's lab work today is resulted largely unchanged from yesterday.  No leukocytosis.  Does have an elevated lactate, receiving IV fluids.  Patient answering questions appropriately, following commands, no acute complaints.  Patient's lactate remains elevated despite IV fluids.  Patient's chest x-ray shows possible atypical infection, urinalysis shows possible urinary tract infection.  I have sent a urine culture as well as blood cultures.  Given the lactate elevation long as low-grade fever we will start the patient on IV Rocephin and Zithromax and admit to the hospital service for further treatment.  Patient agreeable to plan of care.  MAELANI YARBRO was evaluated in Emergency Department on 09/02/2020 for the symptoms described in the history of present illness. She was evaluated in the context of the global COVID-19 pandemic, which necessitated consideration that the patient might be at risk for infection with the SARS-CoV-2 virus that causes COVID-19. Institutional protocols and algorithms that pertain to the evaluation of patients at risk for COVID-19 are in a state of rapid change based on information released by regulatory bodies including the CDC and federal and state organizations. These policies and algorithms were followed during the patient's care in  the ED.  ____________________________________________   FINAL CLINICAL IMPRESSION(S) / ED DIAGNOSES  Fall Fever UTI Pneumonia   Harvest Dark, MD 09/02/20 1906

## 2020-09-03 ENCOUNTER — Encounter: Payer: Self-pay | Admitting: Internal Medicine

## 2020-09-03 ENCOUNTER — Inpatient Hospital Stay: Payer: BC Managed Care – PPO

## 2020-09-03 DIAGNOSIS — B9562 Methicillin resistant Staphylococcus aureus infection as the cause of diseases classified elsewhere: Secondary | ICD-10-CM | POA: Diagnosis not present

## 2020-09-03 DIAGNOSIS — I330 Acute and subacute infective endocarditis: Secondary | ICD-10-CM | POA: Diagnosis not present

## 2020-09-03 DIAGNOSIS — L03116 Cellulitis of left lower limb: Secondary | ICD-10-CM | POA: Diagnosis present

## 2020-09-03 DIAGNOSIS — I9589 Other hypotension: Secondary | ICD-10-CM | POA: Diagnosis not present

## 2020-09-03 DIAGNOSIS — I5033 Acute on chronic diastolic (congestive) heart failure: Secondary | ICD-10-CM | POA: Diagnosis present

## 2020-09-03 DIAGNOSIS — E1141 Type 2 diabetes mellitus with diabetic mononeuropathy: Secondary | ICD-10-CM | POA: Diagnosis present

## 2020-09-03 DIAGNOSIS — Z7401 Bed confinement status: Secondary | ICD-10-CM | POA: Diagnosis not present

## 2020-09-03 DIAGNOSIS — L02416 Cutaneous abscess of left lower limb: Secondary | ICD-10-CM | POA: Diagnosis present

## 2020-09-03 DIAGNOSIS — N179 Acute kidney failure, unspecified: Secondary | ICD-10-CM | POA: Diagnosis present

## 2020-09-03 DIAGNOSIS — A401 Sepsis due to streptococcus, group B: Secondary | ICD-10-CM | POA: Diagnosis present

## 2020-09-03 DIAGNOSIS — R601 Generalized edema: Secondary | ICD-10-CM | POA: Diagnosis not present

## 2020-09-03 DIAGNOSIS — Z6841 Body Mass Index (BMI) 40.0 and over, adult: Secondary | ICD-10-CM | POA: Diagnosis not present

## 2020-09-03 DIAGNOSIS — Z86718 Personal history of other venous thrombosis and embolism: Secondary | ICD-10-CM | POA: Diagnosis not present

## 2020-09-03 DIAGNOSIS — E039 Hypothyroidism, unspecified: Secondary | ICD-10-CM | POA: Diagnosis present

## 2020-09-03 DIAGNOSIS — B951 Streptococcus, group B, as the cause of diseases classified elsewhere: Secondary | ICD-10-CM

## 2020-09-03 DIAGNOSIS — E6609 Other obesity due to excess calories: Secondary | ICD-10-CM | POA: Diagnosis not present

## 2020-09-03 DIAGNOSIS — E785 Hyperlipidemia, unspecified: Secondary | ICD-10-CM | POA: Diagnosis present

## 2020-09-03 DIAGNOSIS — N390 Urinary tract infection, site not specified: Secondary | ICD-10-CM | POA: Diagnosis present

## 2020-09-03 DIAGNOSIS — I714 Abdominal aortic aneurysm, without rupture: Secondary | ICD-10-CM | POA: Diagnosis present

## 2020-09-03 DIAGNOSIS — R000 Tachycardia, unspecified: Secondary | ICD-10-CM | POA: Diagnosis not present

## 2020-09-03 DIAGNOSIS — J181 Lobar pneumonia, unspecified organism: Secondary | ICD-10-CM | POA: Diagnosis not present

## 2020-09-03 DIAGNOSIS — R279 Unspecified lack of coordination: Secondary | ICD-10-CM | POA: Diagnosis not present

## 2020-09-03 DIAGNOSIS — D696 Thrombocytopenia, unspecified: Secondary | ICD-10-CM | POA: Diagnosis present

## 2020-09-03 DIAGNOSIS — G4733 Obstructive sleep apnea (adult) (pediatric): Secondary | ICD-10-CM | POA: Diagnosis present

## 2020-09-03 DIAGNOSIS — E1122 Type 2 diabetes mellitus with diabetic chronic kidney disease: Secondary | ICD-10-CM | POA: Diagnosis present

## 2020-09-03 DIAGNOSIS — N1831 Chronic kidney disease, stage 3a: Secondary | ICD-10-CM | POA: Diagnosis present

## 2020-09-03 DIAGNOSIS — R6889 Other general symptoms and signs: Secondary | ICD-10-CM | POA: Diagnosis not present

## 2020-09-03 DIAGNOSIS — A419 Sepsis, unspecified organism: Secondary | ICD-10-CM | POA: Diagnosis not present

## 2020-09-03 DIAGNOSIS — I471 Supraventricular tachycardia: Secondary | ICD-10-CM | POA: Diagnosis present

## 2020-09-03 DIAGNOSIS — Z743 Need for continuous supervision: Secondary | ICD-10-CM | POA: Diagnosis not present

## 2020-09-03 DIAGNOSIS — R7881 Bacteremia: Secondary | ICD-10-CM | POA: Diagnosis not present

## 2020-09-03 DIAGNOSIS — I130 Hypertensive heart and chronic kidney disease with heart failure and stage 1 through stage 4 chronic kidney disease, or unspecified chronic kidney disease: Secondary | ICD-10-CM | POA: Diagnosis present

## 2020-09-03 DIAGNOSIS — J189 Pneumonia, unspecified organism: Secondary | ICD-10-CM | POA: Diagnosis present

## 2020-09-03 DIAGNOSIS — Z20822 Contact with and (suspected) exposure to covid-19: Secondary | ICD-10-CM | POA: Diagnosis present

## 2020-09-03 DIAGNOSIS — E1169 Type 2 diabetes mellitus with other specified complication: Secondary | ICD-10-CM | POA: Diagnosis present

## 2020-09-03 DIAGNOSIS — E441 Mild protein-calorie malnutrition: Secondary | ICD-10-CM | POA: Diagnosis present

## 2020-09-03 DIAGNOSIS — Z6839 Body mass index (BMI) 39.0-39.9, adult: Secondary | ICD-10-CM | POA: Diagnosis not present

## 2020-09-03 DIAGNOSIS — R17 Unspecified jaundice: Secondary | ICD-10-CM | POA: Diagnosis present

## 2020-09-03 DIAGNOSIS — E669 Obesity, unspecified: Secondary | ICD-10-CM | POA: Diagnosis not present

## 2020-09-03 LAB — BLOOD CULTURE ID PANEL (REFLEXED) - BCID2
A.calcoaceticus-baumannii: NOT DETECTED
A.calcoaceticus-baumannii: NOT DETECTED
Bacteroides fragilis: NOT DETECTED
Bacteroides fragilis: NOT DETECTED
Candida albicans: NOT DETECTED
Candida albicans: NOT DETECTED
Candida auris: NOT DETECTED
Candida auris: NOT DETECTED
Candida glabrata: NOT DETECTED
Candida glabrata: NOT DETECTED
Candida krusei: NOT DETECTED
Candida krusei: NOT DETECTED
Candida parapsilosis: NOT DETECTED
Candida parapsilosis: NOT DETECTED
Candida tropicalis: NOT DETECTED
Candida tropicalis: NOT DETECTED
Cryptococcus neoformans/gattii: NOT DETECTED
Cryptococcus neoformans/gattii: NOT DETECTED
Enterobacter cloacae complex: NOT DETECTED
Enterobacter cloacae complex: NOT DETECTED
Enterobacterales: NOT DETECTED
Enterobacterales: NOT DETECTED
Enterococcus Faecium: NOT DETECTED
Enterococcus Faecium: NOT DETECTED
Enterococcus faecalis: NOT DETECTED
Enterococcus faecalis: NOT DETECTED
Escherichia coli: NOT DETECTED
Escherichia coli: NOT DETECTED
Haemophilus influenzae: NOT DETECTED
Haemophilus influenzae: NOT DETECTED
Klebsiella aerogenes: NOT DETECTED
Klebsiella aerogenes: NOT DETECTED
Klebsiella oxytoca: NOT DETECTED
Klebsiella oxytoca: NOT DETECTED
Klebsiella pneumoniae: NOT DETECTED
Klebsiella pneumoniae: NOT DETECTED
Listeria monocytogenes: NOT DETECTED
Listeria monocytogenes: NOT DETECTED
Methicillin resistance mecA/C: DETECTED — AB
Neisseria meningitidis: NOT DETECTED
Neisseria meningitidis: NOT DETECTED
Proteus species: NOT DETECTED
Proteus species: NOT DETECTED
Pseudomonas aeruginosa: NOT DETECTED
Pseudomonas aeruginosa: NOT DETECTED
Salmonella species: NOT DETECTED
Salmonella species: NOT DETECTED
Serratia marcescens: NOT DETECTED
Serratia marcescens: NOT DETECTED
Staphylococcus aureus (BCID): NOT DETECTED
Staphylococcus aureus (BCID): NOT DETECTED
Staphylococcus epidermidis: NOT DETECTED
Staphylococcus epidermidis: DETECTED — AB
Staphylococcus lugdunensis: NOT DETECTED
Staphylococcus lugdunensis: NOT DETECTED
Staphylococcus species: NOT DETECTED
Staphylococcus species: DETECTED — AB
Stenotrophomonas maltophilia: NOT DETECTED
Stenotrophomonas maltophilia: NOT DETECTED
Streptococcus agalactiae: DETECTED — AB
Streptococcus agalactiae: NOT DETECTED
Streptococcus pneumoniae: NOT DETECTED
Streptococcus pneumoniae: NOT DETECTED
Streptococcus pyogenes: NOT DETECTED
Streptococcus pyogenes: NOT DETECTED
Streptococcus species: DETECTED — AB
Streptococcus species: NOT DETECTED

## 2020-09-03 LAB — CBC WITH DIFFERENTIAL/PLATELET
Abs Immature Granulocytes: 0.02 10*3/uL (ref 0.00–0.07)
Basophils Absolute: 0 10*3/uL (ref 0.0–0.1)
Basophils Relative: 0 %
Eosinophils Absolute: 0.1 10*3/uL (ref 0.0–0.5)
Eosinophils Relative: 1 %
HCT: 33.6 % — ABNORMAL LOW (ref 36.0–46.0)
Hemoglobin: 10.8 g/dL — ABNORMAL LOW (ref 12.0–15.0)
Immature Granulocytes: 0 %
Lymphocytes Relative: 6 %
Lymphs Abs: 0.4 10*3/uL — ABNORMAL LOW (ref 0.7–4.0)
MCH: 30.5 pg (ref 26.0–34.0)
MCHC: 32.1 g/dL (ref 30.0–36.0)
MCV: 94.9 fL (ref 80.0–100.0)
Monocytes Absolute: 0.3 10*3/uL (ref 0.1–1.0)
Monocytes Relative: 4 %
Neutro Abs: 6.5 10*3/uL (ref 1.7–7.7)
Neutrophils Relative %: 89 %
Platelets: 111 10*3/uL — ABNORMAL LOW (ref 150–400)
RBC: 3.54 MIL/uL — ABNORMAL LOW (ref 3.87–5.11)
RDW: 17.2 % — ABNORMAL HIGH (ref 11.5–15.5)
WBC: 7.3 10*3/uL (ref 4.0–10.5)
nRBC: 0 % (ref 0.0–0.2)

## 2020-09-03 LAB — COMPREHENSIVE METABOLIC PANEL
ALT: 18 U/L (ref 0–44)
AST: 34 U/L (ref 15–41)
Albumin: 3.1 g/dL — ABNORMAL LOW (ref 3.5–5.0)
Alkaline Phosphatase: 81 U/L (ref 38–126)
Anion gap: 9 (ref 5–15)
BUN: 17 mg/dL (ref 8–23)
CO2: 24 mmol/L (ref 22–32)
Calcium: 8.3 mg/dL — ABNORMAL LOW (ref 8.9–10.3)
Chloride: 103 mmol/L (ref 98–111)
Creatinine, Ser: 1.18 mg/dL — ABNORMAL HIGH (ref 0.44–1.00)
GFR, Estimated: 48 mL/min — ABNORMAL LOW (ref >60)
Glucose, Bld: 78 mg/dL (ref 70–99)
Potassium: 3.6 mmol/L (ref 3.5–5.1)
Sodium: 136 mmol/L (ref 135–145)
Total Bilirubin: 1.4 mg/dL — ABNORMAL HIGH (ref 0.3–1.2)
Total Protein: 7.5 g/dL (ref 6.5–8.1)

## 2020-09-03 LAB — LACTIC ACID, PLASMA: Lactic Acid, Venous: 1 mmol/L (ref 0.5–1.9)

## 2020-09-03 LAB — STREP PNEUMONIAE URINARY ANTIGEN: Strep Pneumo Urinary Antigen: NEGATIVE

## 2020-09-03 MED ORDER — MIDODRINE HCL 5 MG PO TABS
2.5000 mg | ORAL_TABLET | Freq: Three times a day (TID) | ORAL | Status: DC
  Administered 2020-09-03 – 2020-09-07 (×12): 2.5 mg via ORAL
  Filled 2020-09-03 (×3): qty 1
  Filled 2020-09-03 (×2): qty 0.5
  Filled 2020-09-03 (×8): qty 1

## 2020-09-03 MED ORDER — IMIPRAMINE HCL 25 MG PO TABS
25.0000 mg | ORAL_TABLET | Freq: Every day | ORAL | Status: DC
  Administered 2020-09-03 – 2020-09-13 (×11): 25 mg via ORAL
  Filled 2020-09-03 (×12): qty 1

## 2020-09-03 MED ORDER — MAGNESIUM OXIDE 400 (241.3 MG) MG PO TABS
400.0000 mg | ORAL_TABLET | Freq: Every day | ORAL | Status: DC
  Administered 2020-09-03 – 2020-09-14 (×11): 400 mg via ORAL
  Filled 2020-09-03 (×11): qty 1

## 2020-09-03 MED ORDER — ASPIRIN EC 81 MG PO TBEC
81.0000 mg | DELAYED_RELEASE_TABLET | Freq: Every day | ORAL | Status: DC
  Administered 2020-09-03 – 2020-09-14 (×12): 81 mg via ORAL
  Filled 2020-09-03 (×12): qty 1

## 2020-09-03 MED ORDER — HYDROCODONE-ACETAMINOPHEN 5-325 MG PO TABS
1.0000 | ORAL_TABLET | Freq: Once | ORAL | Status: AC
  Administered 2020-09-03: 1 via ORAL
  Filled 2020-09-03: qty 1

## 2020-09-03 MED ORDER — TRAMADOL HCL 50 MG PO TABS
50.0000 mg | ORAL_TABLET | Freq: Four times a day (QID) | ORAL | Status: DC | PRN
  Administered 2020-09-03: 50 mg via ORAL
  Filled 2020-09-03: qty 1

## 2020-09-03 MED ORDER — CALCIUM CARBONATE-VITAMIN D 500-200 MG-UNIT PO TABS
1.0000 | ORAL_TABLET | Freq: Two times a day (BID) | ORAL | Status: DC
  Administered 2020-09-03 – 2020-09-14 (×22): 1 via ORAL
  Filled 2020-09-03 (×22): qty 1

## 2020-09-03 MED ORDER — ATORVASTATIN CALCIUM 20 MG PO TABS
40.0000 mg | ORAL_TABLET | Freq: Every evening | ORAL | Status: DC
  Administered 2020-09-03 – 2020-09-13 (×11): 40 mg via ORAL
  Filled 2020-09-03 (×12): qty 2

## 2020-09-03 MED ORDER — PENICILLIN G POTASSIUM 20000000 UNITS IJ SOLR
9.0000 10*6.[IU] | Freq: Two times a day (BID) | INTRAVENOUS | Status: DC
  Administered 2020-09-03 – 2020-09-05 (×4): 9 10*6.[IU] via INTRAVENOUS
  Filled 2020-09-03 (×5): qty 9

## 2020-09-03 MED ORDER — PENICILLIN G POTASSIUM 20000000 UNITS IJ SOLR
4.0000 10*6.[IU] | INTRAVENOUS | Status: DC
  Administered 2020-09-03 (×2): 4 10*6.[IU] via INTRAVENOUS
  Filled 2020-09-03 (×8): qty 4

## 2020-09-03 MED ORDER — FLUTICASONE PROPIONATE 50 MCG/ACT NA SUSP
2.0000 | Freq: Every day | NASAL | Status: DC
  Administered 2020-09-04 – 2020-09-14 (×11): 2 via NASAL
  Filled 2020-09-03 (×2): qty 16

## 2020-09-03 MED ORDER — CALCIUM-VITAMIN D 500-200 MG-UNIT PO TABS
1.0000 | ORAL_TABLET | Freq: Two times a day (BID) | ORAL | Status: DC
  Filled 2020-09-03 (×2): qty 1

## 2020-09-03 MED ORDER — DOXYCYCLINE HYCLATE 100 MG PO TABS
100.0000 mg | ORAL_TABLET | Freq: Two times a day (BID) | ORAL | Status: DC
Start: 2020-09-03 — End: 2020-09-05
  Administered 2020-09-03 – 2020-09-05 (×5): 100 mg via ORAL
  Filled 2020-09-03 (×5): qty 1

## 2020-09-03 MED ORDER — MIRABEGRON ER 50 MG PO TB24
50.0000 mg | ORAL_TABLET | Freq: Every day | ORAL | Status: DC
  Administered 2020-09-03 – 2020-09-14 (×12): 50 mg via ORAL
  Filled 2020-09-03: qty 1
  Filled 2020-09-03: qty 2
  Filled 2020-09-03: qty 1
  Filled 2020-09-03 (×2): qty 2
  Filled 2020-09-03 (×6): qty 1
  Filled 2020-09-03: qty 2
  Filled 2020-09-03: qty 1
  Filled 2020-09-03 (×2): qty 2

## 2020-09-03 NOTE — Consult Note (Signed)
NAME: Becky Trevino  DOB: [birth year]  MRN: [id number]  Date/Time: 09/03/2020 11:38 AM  REQUESTING PROVIDER: Dr.Ramesh Subjective:  REASON FOR CONSULT: GBS bacteremia ? Becky Trevino is a 74 y.o. 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 Pt came to ED on 09/01/20 at 2.46pm with fever and chills brought in by EMS. Vitals were BP 166/78, HR 99, Temp 98.6. WBC 11.9, Hb 13.3, PLT 176 CXR/urine were okay. As there was no focal involvement she was sent home. She came back the next day on Sunday with fever ,  She also had fallen off the toilet EMS noted her to be hypotensive. Blood cultures sent and I am seeing the patient for GBS bacteremia Pt says she is unable to walk for the past many months because of left leg /thigh feeling weak and numb and swelling both legs   Has had a complicated MSK hisotry the past 2 years In 2017 she was treated for PJI with 6 weeks of IV vanco and ceftiaxone , culture had coag neg staph, and since then has been on supperssive Doxy because of retained hardware  In NOV 2020  she passed out at work and hit a metal pole.She developed a left gluteal hematoma and 295 ml of dark purple blood aspirated by ortho on Dec , 11 2020. Xarelto stopped On 08/29/19  saw podiatrist for rt ankle pain as she had fallen in her bathroom because of dizziness. Noted to have Communited right distal fibular fracture with butterfly fragment posteriorly, displaced And Avulsion fracture at the medial malleolus/deltoid injury underwent ORIF on 09/02/19 by Dr.Baker. That got infected and she was hospitalized between 10/27/19-11/07/19. She had Group B strep  bacteremia.She also had DVT rt popliteal  She had acute onset of left thigh pain and a CT of the thigh showed Marked enlargement of the left iliopsoas muscle suggesting hematoma, The left hip arthroplasty was intact. She had a greenfield filter on 11/02/19 She was sent to SNF on unasyn 3grams IV q6 for the bacteremia and acinetobacter in the wound for 4 weeks. She has been followed by heme onc/vascular. PCP/Neuro/ortho as OP. She underwent NCS in July 20201 which showed left femoral neuropathy. This was repeated  08/07/20 which again showed severe left femoral neuropathy, with active denervation and no evidence of reinnervation in the vastus medialis, vastus lateralis, or rectus femoris.  Pt has restricted mobility and is pretty much in bed- Trying to get a special wheel chair    Past Medical History:  Diagnosis Date  . Cervical spondylosis   . Chronic kidney disease    Stage 3  . DDD (degenerative disc disease), cervical    [facility]  . Diabetes mellitus without complication ([place])   . Diverticulosis   . Hyperlipidemia   . Hypertension   . Intertrigo   . Leg cramps   . Leg varices   . Obesity   . OSA (obstructive sleep apnea)   . Symptomatic menopausal or female climacteric states   . Syncope     Past Surgical History:  Procedure Laterality Date  . ABDOMINAL HYSTERECTOMY  1993   Total  . BUNIONECTOMY  Bilateral 1993  . I & D KNEE WITH POLY EXCHANGE Right 11/19/2017   Procedure: RIGHT KNEE POLY EXCHANGE WITH IRRIGATION AND DEBRIDEMENT;  Surgeon: Hessie Knows, MD;  Location: [facility];  Service: Orthopedics;  Laterality: Right;  . JOINT REPLACEMENT     bilateral knee  . LAMINECTOMY  11/14/2013    Cervical Fusion , [facility], Dr. Delilah Shan  . LASER ABLATION Bilateral 07/29/2012   Dr. Lucky Cowboy  . LOWER EXTREMITY ANGIOGRAPHY Right 11/02/2019   Procedure: Lower Extremity Angiography;  Surgeon: Katha Cabal, MD;  Location: [facility];  Service: Cardiovascular;  Laterality:  Right;  . ORIF ANKLE FRACTURE Right 09/02/2019   Procedure: OPEN REDUCTION INTERNAL FIXATION (ORIF) ANKLE FRACTURE BIMALLEOLAR;  Surgeon: [name], DPM;  Location: [facility];  Service: Podiatry;  Laterality: Right;  . PERIPHERAL VASCULAR THROMBECTOMY Right 03/02/2020   Procedure: PERIPHERAL VASCULAR THROMBECTOMY;  Surgeon: Algernon Huxley, MD;  Location: [facility];  Service: Cardiovascular;  Laterality: Right;  . REPLACEMENT TOTAL KNEE Left 03/2010   Dr. Rudene Christians  . TOTAL HIP ARTHROPLASTY Left 12/11/2015   Procedure: TOTAL HIP ARTHROPLASTY ANTERIOR APPROACH;  Surgeon: Hessie Knows, MD;  Location: [facility];  Service: Orthopedics;  Laterality: Left;  . TOTAL KNEE ARTHROPLASTY Right 03/11/2016   Procedure: TOTAL KNEE ARTHROPLASTY;  Surgeon: Hessie Knows, MD;  Location: [facility];  Service: Orthopedics;  Laterality: Right;    Social History   Socioeconomic History  . Marital status: Married    Spouse name: Not on file  . Number of children: 1  . Years of education: Not on file  . Highest education level: Not on file  Occupational History  . Not on file  Tobacco Use  . Smoking status: Never Smoker  . Smokeless tobacco: Never Used  Vaping Use  . Vaping Use: Never used  Substance and Sexual Activity  . Alcohol use: No    Alcohol/week: 0.0 standard drinks  . Drug use: No  . Sexual activity: Yes    Partners: Male  Other Topics Concern  . Not on file  Social History Narrative   Married, one adopted grown child    Social Determinants of Health   Financial Resource Strain: Low Risk   . Difficulty of Paying Living Expenses: Not hard at all  Food Insecurity: No Food Insecurity  . Worried About Charity fundraiser in the Last Year: Never true  . Ran Out of Food in the Last Year: Never true  Transportation Needs: No Transportation Needs  . Lack of Transportation (Medical): No  . Lack of Transportation (Non-Medical): No  Physical Activity: Inactive  . Days of Exercise per Week: 0  days  . Minutes of Exercise per Session: 0 min  Stress: No Stress Concern Present  . Feeling of Stress : Not at all  Social Connections: Moderately Integrated  . Frequency of Communication with Friends and Family: More than three times a week  . Frequency of Social Gatherings with Friends and Family: More than three times a week  . Attends Religious Services: More than 4 times per year  . Active Member of Clubs or Organizations: No  . Attends Archivist Meetings: Never  . Marital Status: Married  Human resources officer Violence: Not At Risk  . Fear of Current or Ex-Partner: No  . Emotionally Abused: No  . Physically Abused: No  . Sexually Abused: No    Family History  Problem Relation Age of Onset  . Diabetes Mother   . Hyperlipidemia  Mother   . Hypertension Mother   . Diabetes Father   . Hyperlipidemia Father   . Hypertension Father   . Obesity Father   . Hypertension Sister   . Hyperlipidemia Sister   . Hyperlipidemia Sister   . Breast cancer Neg Hx    Allergies  Allergen Reactions  . Lisinopril Swelling    ? Current Facility-Administered Medications  Medication Dose Route Frequency Provider Last Rate Last Admin  . acetaminophen (TYLENOL) tablet 650 mg  650 mg Oral Q6H PRN Reubin Milan, MD   650 mg at 09/03/20 0258   Or  . acetaminophen (TYLENOL) suppository 650 mg  650 mg Rectal Q6H PRN Reubin Milan, MD      . aspirin EC tablet 81 mg  81 mg Oral Daily Kc, Ramesh, MD      . atorvastatin (LIPITOR) tablet 40 mg  40 mg Oral QPM Kc, Ramesh, MD      . calcium-vitamin D (OSCAL WITH D) 500-200 MG-UNIT per tablet 1 tablet  1 tablet Oral BID Kc, Ramesh, MD      . enoxaparin (LOVENOX) injection 62.5 mg  0.5 mg/kg Subcutaneous Q24H Renda Rolls, RPH   62.5 mg at 09/02/20 2229  . [START ON 09/04/2020] fluticasone (FLONASE) 50 MCG/ACT nasal spray 2 spray  2 spray Each Nare Daily Kc, Ramesh, MD      . imipramine (TOFRANIL) tablet 25 mg  25 mg Oral QHS Kc,  Ramesh, MD      . magnesium oxide (MAG-OX) tablet 400 mg  400 mg Oral Daily Kc, Ramesh, MD      . midodrine (PROAMATINE) tablet 2.5 mg  2.5 mg Oral TID WC Kc, Ramesh, MD      . mirabegron ER (MYRBETRIQ) tablet 50 mg  50 mg Oral Daily Kc, Ramesh, MD      . ondansetron (ZOFRAN) tablet 4 mg  4 mg Oral Q6H PRN Reubin Milan, MD       Or  . ondansetron [facility]) injection 4 mg  4 mg Intravenous Q6H PRN Reubin Milan, MD      . penicillin G potassium 4 Million Units in dextrose 5 % 250 mL IVPB  4 Million Units Intravenous Q4H Antonieta Pert, MD 250 mL/hr at 09/03/20 1038 4 Million Units at 09/03/20 1038   Current Outpatient Medications  Medication Sig Dispense Refill  . ACCU-CHEK GUIDE test strip CHECK FASTING BLOOD SUGAR ONCE DAILY AND UP TO 4 TIMES DAILY AS DIRECTED 100 strip 12  . acetaminophen (TYLENOL) 650 MG CR tablet Take 650-1,300 mg by mouth 2 (two) times daily as needed for pain.    Marland Kitchen aspirin 81 MG EC tablet TAKE 1 TABLET BY MOUTH EVERY DAY 90 tablet 1  . atorvastatin (LIPITOR) 40 MG tablet Take 1 tablet (40 mg total) by mouth every evening. 90 tablet 1  . blood glucose meter kit and supplies Dispense based on patient and insurance preference. Use up to four times daily as directed. (FOR ICD-10 E10.9, E11.9). 1 each 0  . Calcium Carbonate-Vitamin D 600-400 MG-UNIT tablet Take 1 tablet by mouth 2 (two) times daily.    Marland Kitchen doxycycline (VIBRAMYCIN) 100 MG capsule Take 100 mg by mouth 2 (two) times daily.    . fluticasone (FLONASE) 50 MCG/ACT nasal spray Place 2 sprays into both nostrils daily. 16 g 2  . furosemide (LASIX) 20 MG tablet Take 20 mg by mouth every evening.     Marland Kitchen imipramine (TOFRANIL) 25 MG tablet TAKE 1  TABLET BY MOUTH EVERYDAY AT BEDTIME 90 tablet 3  . ketoconazole (NIZORAL) 2 % cream Apply 1 application topically 2 (two) times daily as needed (rash).     Marland Kitchen latanoprost (XALATAN) 0.005 % ophthalmic solution SMARTSIG:In Eye(s)    . Magnesium Oxide 500 MG CAPS Take 500 mg by  mouth daily.     . metoprolol succinate (TOPROL-XL) 25 MG 24 hr tablet Take 1 tablet (25 mg total) by mouth daily. 90 tablet 0  . midodrine (PROAMATINE) 2.5 MG tablet Take 1 tablet (2.5 mg total) by mouth 3 (three) times daily with meals. 90 tablet 2  . mirabegron ER (MYRBETRIQ) 50 MG TB24 tablet Take 1 tablet (50 mg total) by mouth daily. 90 tablet 3  . potassium chloride (KLOR-CON) 10 MEQ tablet Take 10 mEq by mouth daily.    Marland Kitchen SIMBRINZA 1-0.2 % SUSP Place 1 drop into both eyes 2 (two) times daily.       Abtx:  Anti-infectives (From admission, onward)   Start     Dose/Rate Route Frequency Ordered Stop   09/04/20 0000  cefTRIAXone (ROCEPHIN) 2 g in sodium chloride 0.9 % 100 mL IVPB  Status:  Discontinued        2 g 200 mL/hr over 30 Minutes Intravenous Every 24 hours 09/02/20 2230 09/03/20 0754   09/03/20 2000  azithromycin (ZITHROMAX) 500 mg in sodium chloride 0.9 % 250 mL IVPB  Status:  Discontinued        500 mg 250 mL/hr over 60 Minutes Intravenous Every 24 hours 09/02/20 2230 09/03/20 0754   09/03/20 1000  penicillin G potassium 4 Million Units in dextrose 5 % 250 mL IVPB        4 Million Units 250 mL/hr over 60 Minutes Intravenous Every 4 hours 09/03/20 0754     09/02/20 2245  cefTRIAXone (ROCEPHIN) 1 g in sodium chloride 0.9 % 100 mL IVPB        1 g 200 mL/hr over 30 Minutes Intravenous  Once 09/02/20 2230 09/03/20 0045   09/02/20 1915  cefTRIAXone (ROCEPHIN) 1 g in sodium chloride 0.9 % 100 mL IVPB        1 g 200 mL/hr over 30 Minutes Intravenous  Once 09/02/20 1905 09/02/20 2006   09/02/20 1915  azithromycin (ZITHROMAX) 500 mg in sodium chloride 0.9 % 250 mL IVPB        500 mg 250 mL/hr over 60 Minutes Intravenous  Once 09/02/20 1905 09/02/20 2110      REVIEW OF SYSTEMS:  Const: fever,  chills, negative weight loss Eyes: negative diplopia or visual changes, negative eye pain ENT: negative coryza, negative sore throat Resp: negative cough, hemoptysis, dyspnea Cards:  negative for chest pain, palpitations, severe lower extremity edema GU: negative for frequency, dysuria and hematuria GI: Negative for abdominal pain, diarrhea, bleeding, constipation Skin: negative for rash and pruritus Heme: negative for easy bruising and gum/nose bleeding MS: left leg weakness, generalized weakness Neurolo:negative for headaches, dizziness, vertigo, memory problems  Psych: negative for feelings of anxiety, depression  Endocrine: negative for thyroid, diabetes Allergy/Immunology-as above Objective:  VITALS:  BP 122/63   Pulse 72   Temp 99 F (37.2 C) (Oral)   Resp 18   Ht 5' 11"  (1.803 m)   Wt 127 kg   SpO2 93%   BMI 39.05 kg/m  PHYSICAL EXAM:  General: Alert, cooperative, no distress, appears stated age.  Head: Normocephalic, without obvious abnormality, atraumatic. Eyes: Conjunctivae clear, anicteric sclerae. Pupils are equal ENT Nares  normal. No drainage or sinus tenderness. Lips, mucosa, and tongue normal. No Thrush Neck: Supple, symmetrical, no adenopathy, thyroid: non tender no carotid bruit and no JVD. Back: No CVA tenderness. Lungs: Clear to auscultation bilaterally. No Wheezing or Rhonchi. No rales. Heart: Regular rate and rhythm, no murmur, rub or gallop. Abdomen: Soft, non-tender,not distended. Bowel sounds normal. No masses Extremities: severe lymph/venous edema both legs Left > rt There is some erythema and induration over the left thigh- laterally    Skin: dry scaly skin over the legs Lymph: Cervical, supraclavicular normal. Neurologic: did not examine In detail Pertinent Labs Lab Results CBC    Component Value Date/Time   WBC 7.3 09/03/2020 0415   RBC 3.54 (L) 09/03/2020 0415   HGB 10.8 (L) 09/03/2020 0415   HGB 13.2 11/26/2015 1122   HCT 33.6 (L) 09/03/2020 0415   HCT 40.9 11/26/2015 1122   PLT 111 (L) 09/03/2020 0415   PLT 224 11/26/2015 1122   MCV 94.9 09/03/2020 0415   MCV 96 11/26/2015 1122   MCV 96 10/18/2012 1716   MCH  30.5 09/03/2020 0415   MCHC 32.1 09/03/2020 0415   RDW 17.2 (H) 09/03/2020 0415   RDW 15.1 11/26/2015 1122   RDW 15.2 (H) 10/18/2012 1716   LYMPHSABS 0.4 (L) 09/03/2020 0415   LYMPHSABS 2.3 11/26/2015 1122   MONOABS 0.3 09/03/2020 0415   EOSABS 0.1 09/03/2020 0415   EOSABS 0.3 11/26/2015 1122   BASOSABS 0.0 09/03/2020 0415   BASOSABS 0.0 11/26/2015 1122    CMP Latest Ref Rng & Units 09/03/2020 09/02/2020 09/01/2020  Glucose 70 - 99 mg/dL 78 101(H) 123(H)  BUN 8 - 23 mg/dL 17 20 17   Creatinine 0.44 - 1.00 mg/dL 1.18(H) 1.52(H) 1.25(H)  Sodium 135 - 145 mmol/L 136 136 139  Potassium 3.5 - 5.1 mmol/L 3.6 3.9 3.9  Chloride 98 - 111 mmol/L 103 102 102  CO2 22 - 32 mmol/L 24 22 26   Calcium 8.9 - 10.3 mg/dL 8.3(L) 8.5(L) 9.4  Total Protein 6.5 - 8.1 g/dL 7.5 7.6 -  Total Bilirubin 0.3 - 1.2 mg/dL 1.4(H) 1.3(H) -  Alkaline Phos 38 - 126 U/L 81 82 -  AST 15 - 41 U/L 34 35 -  ALT 0 - 44 U/L 18 18 -      Microbiology: Recent Results (from the past 240 hour(s))  Resp Panel by RT-PCR (Flu A&B, Covid) Nasopharyngeal Swab     Status: None   Collection Time: 09/02/20  4:44 PM   Specimen: Nasopharyngeal Swab; Nasopharyngeal(NP) swabs in vial transport medium  Result Value Ref Range Status   SARS Coronavirus 2 by RT PCR NEGATIVE NEGATIVE Final    Comment: (NOTE) SARS-CoV-2 target nucleic acids are NOT DETECTED.  The SARS-CoV-2 RNA is generally detectable in upper respiratory specimens during the acute phase of infection. The lowest concentration of SARS-CoV-2 viral copies this assay can detect is 138 copies/mL. A negative result does not preclude SARS-Cov-2 infection and should not be used as the sole basis for treatment or other patient management decisions. A negative result may occur with  improper specimen collection/handling, submission of specimen other than nasopharyngeal swab, presence of viral mutation(s) within the areas targeted by this assay, and inadequate number of  viral copies(<138 copies/mL). A negative result must be combined with clinical observations, patient history, and epidemiological information. The expected result is Negative.  Fact Sheet for Patients:  EntrepreneurPulse.com.au  Fact Sheet for Healthcare Providers:  IncredibleEmployment.be  This test is no t yet approved  or cleared by the [place] and  has been authorized for detection and/or diagnosis of SARS-CoV-2 by FDA under an Emergency Use Authorization (EUA). This EUA will remain  in effect (meaning this test can be used) for the duration of the COVID-19 declaration under Section 564(b)(1) of the Act, 21 U.S.C.section 360bbb-3(b)(1), unless the authorization is terminated  or revoked sooner.       Influenza A by PCR NEGATIVE NEGATIVE Final   Influenza B by PCR NEGATIVE NEGATIVE Final    Comment: (NOTE) The Xpert Xpress SARS-CoV-2/FLU/RSV plus assay is intended as an aid in the diagnosis of influenza from Nasopharyngeal swab specimens and should not be used as a sole basis for treatment. Nasal washings and aspirates are unacceptable for Xpert Xpress SARS-CoV-2/FLU/RSV testing.  Fact Sheet for Patients: EntrepreneurPulse.com.au  Fact Sheet for Healthcare Providers: IncredibleEmployment.be  This test is not yet approved or cleared by the [place] FDA and has been authorized for detection and/or diagnosis of SARS-CoV-2 by FDA under an Emergency Use Authorization (EUA). This EUA will remain in effect (meaning this test can be used) for the duration of the COVID-19 declaration under Section 564(b)(1) of the Act, 21 U.S.C. section 360bbb-3(b)(1), unless the authorization is terminated or revoked.  Performed at [facility], [address] [place], [place] [address]   Blood Culture ID Panel (Reflexed)     Status: Abnormal   Collection Time: 09/02/20  5:13 PM  Result Value Ref  Range Status   Enterococcus faecalis NOT DETECTED NOT DETECTED Final   Enterococcus Faecium NOT DETECTED NOT DETECTED Final   Listeria monocytogenes NOT DETECTED NOT DETECTED Final   Staphylococcus species DETECTED (A) NOT DETECTED Final    Comment: CRITICAL RESULT CALLED TO, READ BACK BY AND VERIFIED WITH:  AMY THOMPSON AT 1004 09/03/20 SDR    Staphylococcus aureus (BCID) NOT DETECTED NOT DETECTED Final   Staphylococcus epidermidis DETECTED (A) NOT DETECTED Final    Comment: Methicillin (oxacillin) resistant coagulase negative staphylococcus. Possible blood culture contaminant (unless isolated from more than one blood culture draw or clinical case suggests pathogenicity). No antibiotic treatment is indicated for blood  culture contaminants. CRITICAL RESULT CALLED TO, READ BACK BY AND VERIFIED WITH:  AMY THOMPSON AT 1004 09/03/20 SDR    Staphylococcus lugdunensis NOT DETECTED NOT DETECTED Final   Streptococcus species NOT DETECTED NOT DETECTED Final   Streptococcus agalactiae NOT DETECTED NOT DETECTED Final   Streptococcus pneumoniae NOT DETECTED NOT DETECTED Final   Streptococcus pyogenes NOT DETECTED NOT DETECTED Final   A.calcoaceticus-baumannii NOT DETECTED NOT DETECTED Final   Bacteroides fragilis NOT DETECTED NOT DETECTED Final   Enterobacterales NOT DETECTED NOT DETECTED Final   Enterobacter cloacae complex NOT DETECTED NOT DETECTED Final   Escherichia coli NOT DETECTED NOT DETECTED Final   Klebsiella aerogenes NOT DETECTED NOT DETECTED Final   Klebsiella oxytoca NOT DETECTED NOT DETECTED Final   Klebsiella pneumoniae NOT DETECTED NOT DETECTED Final   Proteus species NOT DETECTED NOT DETECTED Final   Salmonella species NOT DETECTED NOT DETECTED Final   Serratia marcescens NOT DETECTED NOT DETECTED Final   Haemophilus influenzae NOT DETECTED NOT DETECTED Final   Neisseria meningitidis NOT DETECTED NOT DETECTED Final   Pseudomonas aeruginosa NOT DETECTED NOT DETECTED Final    Stenotrophomonas maltophilia NOT DETECTED NOT DETECTED Final   Candida albicans NOT DETECTED NOT DETECTED Final   Candida auris NOT DETECTED NOT DETECTED Final   Candida glabrata NOT DETECTED NOT DETECTED Final   Candida krusei NOT  DETECTED NOT DETECTED Final   Candida parapsilosis NOT DETECTED NOT DETECTED Final   Candida tropicalis NOT DETECTED NOT DETECTED Final   Cryptococcus neoformans/gattii NOT DETECTED NOT DETECTED Final   Methicillin resistance mecA/C DETECTED (A) NOT DETECTED Final    Comment: CRITICAL RESULT CALLED TO, READ BACK BY AND VERIFIED WITH:  AMY THOMPSON AT 1004 09/03/20 SDR Performed at [facility], [address] [place], [place] [address]   Blood culture (routine x 2)     Status: None (Preliminary result)   Collection Time: 09/02/20  5:25 PM   Specimen: BLOOD  Result Value Ref Range Status   Specimen Description BLOOD BLOOD RIGHT HAND  Final   Special Requests   Final    BOTTLES DRAWN AEROBIC AND ANAEROBIC Blood Culture results may not be optimal due to an inadequate volume of blood received in culture bottles   Culture  Setup Time   Final    Organism ID to follow GRAM POSITIVE COCCI IN CHAINS AEROBIC BOTTLE ONLY CRITICAL VALUE NOTED.  VALUE IS CONSISTENT WITH PREVIOUSLY REPORTED AND CALLED VALUE. GRAM POSITIVE COCCI ANAEROBIC BOTTLE ONLY CRITICAL RESULT CALLED TO, READ BACK BY AND VERIFIED WITH: AMY THOMPSON AT 1004 09/03/20 SDR Performed at [facility], [address] [place], [place] [address]    Culture   Final    GRAM POSITIVE COCCI IN CHAINS AEROBIC BOTTLE ONLY GRAM POSITIVE COCCI ANAEROBIC BOTTLE ONLY    Report Status PENDING  Incomplete  Blood culture (routine x 2)     Status: None (Preliminary result)   Collection Time: 09/02/20  5:25 PM   Specimen: BLOOD  Result Value Ref Range Status   Specimen Description BLOOD BLOOD LEFT HAND  Final   Special Requests   Final    BOTTLES DRAWN AEROBIC AND ANAEROBIC Blood Culture  results may not be optimal due to an inadequate volume of blood received in culture bottles   Culture  Setup Time   Final    Organism ID to follow GRAM POSITIVE COCCI IN CHAINS IN BOTH AEROBIC AND ANAEROBIC BOTTLES CRITICAL RESULT CALLED TO, READ BACK BY AND VERIFIED WITH: JASON ROBBINS AT 0715 09/03/20 SDR Performed at [facility], [address] [place], [place] [address]    Culture GRAM POSITIVE COCCI IN CHAINS  Final   Report Status PENDING  Incomplete  Blood Culture ID Panel (Reflexed)     Status: Abnormal   Collection Time: 09/02/20  5:25 PM  Result Value Ref Range Status   Enterococcus faecalis NOT DETECTED NOT DETECTED Final   Enterococcus Faecium NOT DETECTED NOT DETECTED Final   Listeria monocytogenes NOT DETECTED NOT DETECTED Final   Staphylococcus species NOT DETECTED NOT DETECTED Final   Staphylococcus aureus (BCID) NOT DETECTED NOT DETECTED Final   Staphylococcus epidermidis NOT DETECTED NOT DETECTED Final   Staphylococcus lugdunensis NOT DETECTED NOT DETECTED Final   Streptococcus species DETECTED (A) NOT DETECTED Final    Comment: CRITICAL RESULT CALLED TO, READ BACK BY AND VERIFIED WITH: JASON ROBBINS AT 0715 09/03/20 SDR    Streptococcus agalactiae DETECTED (A) NOT DETECTED Final    Comment: CRITICAL RESULT CALLED TO, READ BACK BY AND VERIFIED WITH:  JASON ROBBINS AT 0715 09/03/20 SDR    Streptococcus pneumoniae NOT DETECTED NOT DETECTED Final   Streptococcus pyogenes NOT DETECTED NOT DETECTED Final   A.calcoaceticus-baumannii NOT DETECTED NOT DETECTED Final   Bacteroides fragilis NOT DETECTED NOT DETECTED Final   Enterobacterales NOT DETECTED NOT DETECTED Final   Enterobacter cloacae  complex NOT DETECTED NOT DETECTED Final   Escherichia coli NOT DETECTED NOT DETECTED Final   Klebsiella aerogenes NOT DETECTED NOT DETECTED Final   Klebsiella oxytoca NOT DETECTED NOT DETECTED Final   Klebsiella pneumoniae NOT DETECTED NOT DETECTED Final   Proteus  species NOT DETECTED NOT DETECTED Final   Salmonella species NOT DETECTED NOT DETECTED Final   Serratia marcescens NOT DETECTED NOT DETECTED Final   Haemophilus influenzae NOT DETECTED NOT DETECTED Final   Neisseria meningitidis NOT DETECTED NOT DETECTED Final   Pseudomonas aeruginosa NOT DETECTED NOT DETECTED Final   Stenotrophomonas maltophilia NOT DETECTED NOT DETECTED Final   Candida albicans NOT DETECTED NOT DETECTED Final   Candida auris NOT DETECTED NOT DETECTED Final   Candida glabrata NOT DETECTED NOT DETECTED Final   Candida krusei NOT DETECTED NOT DETECTED Final   Candida parapsilosis NOT DETECTED NOT DETECTED Final   Candida tropicalis NOT DETECTED NOT DETECTED Final   Cryptococcus neoformans/gattii NOT DETECTED NOT DETECTED Final    Comment: Performed at [facility], [address] [place], [place] [address]    IMAGING RESULTS: I have personally reviewed the films ? Impression/Recommendation ? 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 On suppressive doxy indefinitely after IV antibiotics.  Left THA  Discussed the management with patient and the hospitalist  ? Note:  This document was prepared using Dragon voice recognition software and may include unintentional dictation errors.

## 2020-09-03 NOTE — ED Notes (Signed)
Pt given ginger ale and water 

## 2020-09-03 NOTE — Progress Notes (Signed)
PHARMACY - PHYSICIAN COMMUNICATION CRITICAL VALUE ALERT - BLOOD CULTURE IDENTIFICATION (BCID)  Becky Trevino is an 74 y.o. female who presented to [facility] on 09/02/2020 with a chief complaint of chills, fever, weakness, and fall  Assessment:  Strep agalactiae, "group B" in 3 of 4 bottles, no resistance was detected.   Name of physician (or Provider) Contacted: Antonieta Pert  Current antibiotics: Ceftriaxone 2g q24 and Azithromycin 500mg  q24  Changes to prescribed antibiotics recommended: Penicillin-G 4 million units q4h  Recommendations accepted by provider  Results for orders placed or performed during the hospital encounter of 09/02/20  Blood Culture ID Panel (Reflexed) (Collected: 09/02/2020  5:25 PM)  Result Value Ref Range   Enterococcus faecalis NOT DETECTED NOT DETECTED   Enterococcus Faecium NOT DETECTED NOT DETECTED   Listeria monocytogenes NOT DETECTED NOT DETECTED   Staphylococcus species NOT DETECTED NOT DETECTED   Staphylococcus aureus (BCID) NOT DETECTED NOT DETECTED   Staphylococcus epidermidis NOT DETECTED NOT DETECTED   Staphylococcus lugdunensis NOT DETECTED NOT DETECTED   Streptococcus species DETECTED (A) NOT DETECTED   Streptococcus agalactiae DETECTED (A) NOT DETECTED   Streptococcus pneumoniae NOT DETECTED NOT DETECTED   Streptococcus pyogenes NOT DETECTED NOT DETECTED   A.calcoaceticus-baumannii NOT DETECTED NOT DETECTED   Bacteroides fragilis NOT DETECTED NOT DETECTED   Enterobacterales NOT DETECTED NOT DETECTED   Enterobacter cloacae complex NOT DETECTED NOT DETECTED   Escherichia coli NOT DETECTED NOT DETECTED   Klebsiella aerogenes NOT DETECTED NOT DETECTED   Klebsiella oxytoca NOT DETECTED NOT DETECTED   Klebsiella pneumoniae NOT DETECTED NOT DETECTED   Proteus species NOT DETECTED NOT DETECTED   Salmonella species NOT DETECTED NOT DETECTED   Serratia marcescens NOT DETECTED NOT DETECTED   Haemophilus influenzae NOT DETECTED NOT DETECTED    Neisseria meningitidis NOT DETECTED NOT DETECTED   Pseudomonas aeruginosa NOT DETECTED NOT DETECTED   Stenotrophomonas maltophilia NOT DETECTED NOT DETECTED   Candida albicans NOT DETECTED NOT DETECTED   Candida auris NOT DETECTED NOT DETECTED   Candida glabrata NOT DETECTED NOT DETECTED   Candida krusei NOT DETECTED NOT DETECTED   Candida parapsilosis NOT DETECTED NOT DETECTED   Candida tropicalis NOT DETECTED NOT DETECTED   Cryptococcus neoformans/gattii NOT DETECTED NOT [place], PharmD, BCPS Clinical Pharmacist 09/03/2020 7:39 AM

## 2020-09-03 NOTE — Progress Notes (Signed)
PROGRESS NOTE    Becky Trevino  ATF:[id number] DOB: 1946/03/22 DOA: 09/02/2020 PCP: [name], MD   Chief Complaint  Patient presents with  . Fever  . Fall  Brief Narrative: 74 year old female with history of DDD/spondylosis of cervical spine, stage III CKD, T2DM, diverticulosis, hyperlipidemia, hypertension varicose CT of lower extremities/lymphedema, obesity/OSA, history of syncope, chronic diastolic CHF coming to the ED for second day in a row due to multiple complaints of progressive worsening chills, rigors, fever, frontal headache, lightheadedness, decreased appetite/fatigue/malaise 1 set about a week ago prior to admission.  Initially right lower extremity pain after a fall/from wheelchair, no LOC or head injury. As per EMS patient was hypotensive on arrival and given a liter of IV fluids. In the ED work-up showed UA hazy with large hemoglobinuria proteinuria microscopic urine exam shows RBC more than 50 WBC 6-10, WBC count 10.4 with 92% neutrophils, CMP showed normal electrolytes, elevated creatinine 1.5, lactic acid is 2.9. given IV fluids. CXR cardiomegaly with CHF ? Underlying infectious process,-BNP was obtained that was marginally up to 224, pelvis x-ray no acute fracture, RT Hip- end stage degenerative changes. Patient was admitted for AKI, sepsis due to undetermined etiology.   Subjective: Resting. Feels well this am Patient otherwise denies any nausea, vomiting, chest pain, shortness of breath, fever, chills, headache, focal weakness, speech difficulties. B/l extensive lymphedema and bed bound since feb, Blood pressure has been stable, patient has been afebrile overnight.  On room air. Lab shows resolved lactic acidosis and improving renal function, blood cultures showing 3 out of 4 history of Strep agalactiae  Assessment & Plan:  Strep agalactiae Sepsis POA, ?source. Has complicated MSK history- PJI treaten in 2017 with iv vanco/cefrtiaxone and on doxy + refiampin  for retinainted prosthesis hx  And off in 2019- and is on Doxy Dose antibiotic as per pharmacy changing to penicillin.  Will d/w ID-consult Dr Delaine Lame. She has chronic lymphedema , has  Dentures (uper/lower ) for few years, No caries.No significant abd pain or tenderness on exam, no open wounds- but bedbound since Jan this year.   AKI on CKD stage IIIa-Baseline creatinine appears around 1-1.28.  On admission 1.5 and improving.  Monitor closely Recent Labs  Lab 09/01/20 1503 09/02/20 1542 09/03/20 0415  BUN 17 20 17   CREATININE 1.25* 1.52* 1.18*   Benign essential HTN: BP soft. Hold metoprolol, resume  Home midodrine  T2DM-last hemoglobin A1c 5.5 back in September.  We will add sliding scale insulin. Lab Results  Component Value Date   HGBA1C 5.5 05/29/2020   No results for input(s): GLUCAP in the last 168 hours.  HLD: We will resume statin and monitor LFTs.  Morbid obesity with BMI 39 : Will benefit with weight loss/PCP follow-up but not sure how much feasible given her bedbound status and significant lymphedema.  OSA cont bedtime cpap  Chronic pymphedemia with bilaterally extensive leg swelling, patient reports she has been bedbound since January.  Lasix on hold hopefully can resume tomorrow.  She is on chronic midodrine will resume.  Hx of DVT- reports she had Filter placed and has been off eliquis by Dr Tasia Catchings per her.  Normocytic anemia/Thrombocytopenia: Hemoglobin platelet appears stable slightly on lower side we will continue to monitor suspecting worsening status in the setting of sepsis Recent Labs  Lab 09/01/20 1503 09/02/20 1542 09/03/20 0415  HGB 13.3 11.0* 10.8*  HCT 41.4 33.9* 33.6*  WBC 11.9* 10.4 7.3  PLT 176 123* 111*   Hyperbilirubinemia- tb slightly up ,  monitor  Nutrition: Diet Order            Diet heart healthy/carb modified Room service appropriate? Yes; Fluid consistency: Thin  Diet effective now                 Body mass index is 39.05  kg/m.  DVT prophylaxis: lOVENOX Code Status:   Code Status: Full Code Family Communication: plan of care discussed with patient at bedside.  Status is: admitted as Observation Patient will need at least 2 midnight stay for ongoing management of her sepsis further ID eval, will need to  IV antibiotics. will cahnge to medsug with tele since Bp stable.  Dispo: The patient is from: Home              Anticipated d/c is to: TBD              Anticipated d/c date is: 2 days              Patient currently is not medically stable to d/c.   Consultants:see note  Procedures:see note  Culture/Microbiology    Component Value Date/Time   SDES BLOOD BLOOD RIGHT HAND 09/02/2020 1725   SDES BLOOD BLOOD LEFT HAND 09/02/2020 1725   SPECREQUEST  09/02/2020 1725    BOTTLES DRAWN AEROBIC AND ANAEROBIC Blood Culture results may not be optimal due to an inadequate volume of blood received in culture bottles   SPECREQUEST  09/02/2020 1725    BOTTLES DRAWN AEROBIC AND ANAEROBIC Blood Culture results may not be optimal due to an inadequate volume of blood received in culture bottles   CULT  09/02/2020 1725    [place] CHAINS AEROBIC BOTTLE ONLY GRAM POSITIVE COCCI ANAEROBIC BOTTLE ONLY    CULT GRAM POSITIVE COCCI IN CHAINS 09/02/2020 1725   REPTSTATUS PENDING 09/02/2020 1725   REPTSTATUS PENDING 09/02/2020 1725    Other culture-see note  Medications: Scheduled Meds: . aspirin  81 mg Oral Daily  . atorvastatin  40 mg Oral QPM  . Calcium Carbonate-Vitamin D  1 tablet Oral BID  . enoxaparin (LOVENOX) injection  0.5 mg/kg Subcutaneous Q24H  . fluticasone  2 spray Each Nare Daily  . imipramine  25 mg Oral QHS  . Magnesium Oxide  500 mg Oral Daily  . midodrine  2.5 mg Oral TID WC  . mirabegron ER  50 mg Oral Daily   Continuous Infusions: . pencillin G potassium IV 4 Million Units (09/03/20 1038)    Antimicrobials: Anti-infectives (From admission, onward)   Start     Dose/Rate  Route Frequency Ordered Stop   09/04/20 0000  cefTRIAXone (ROCEPHIN) 2 g in sodium chloride 0.9 % 100 mL IVPB  Status:  Discontinued        2 g 200 mL/hr over 30 Minutes Intravenous Every 24 hours 09/02/20 2230 09/03/20 0754   09/03/20 2000  azithromycin (ZITHROMAX) 500 mg in sodium chloride 0.9 % 250 mL IVPB  Status:  Discontinued        500 mg 250 mL/hr over 60 Minutes Intravenous Every 24 hours 09/02/20 2230 09/03/20 0754   09/03/20 1000  penicillin G potassium 4 Million Units in dextrose 5 % 250 mL IVPB        4 Million Units 250 mL/hr over 60 Minutes Intravenous Every 4 hours 09/03/20 0754     09/02/20 2245  cefTRIAXone (ROCEPHIN) 1 g in sodium chloride 0.9 % 100 mL IVPB        1 g 200  mL/hr over 30 Minutes Intravenous  Once 09/02/20 2230 09/03/20 0045   09/02/20 1915  cefTRIAXone (ROCEPHIN) 1 g in sodium chloride 0.9 % 100 mL IVPB        1 g 200 mL/hr over 30 Minutes Intravenous  Once 09/02/20 1905 09/02/20 2006   09/02/20 1915  azithromycin (ZITHROMAX) 500 mg in sodium chloride 0.9 % 250 mL IVPB        500 mg 250 mL/hr over 60 Minutes Intravenous  Once 09/02/20 1905 09/02/20 2110     Objective: Vitals: Today's Vitals   09/03/20 0630 09/03/20 0630 09/03/20 0800 09/03/20 1000  BP: 118/63  (!) 95/54 122/63  Pulse: 91  95 72  Resp: 18     Temp: 98.6 F (37 C)   99 F (37.2 C)  TempSrc: Oral   Oral  SpO2: 94%  92% 93%  Weight:      Height:      PainSc: Asleep Asleep  0-No pain    Intake/Output Summary (Last 24 hours) at 09/03/2020 1109 Last data filed at 09/03/2020 0720 Gross per 24 hour  Intake 2943.75 ml  Output --  Net 2943.75 ml   Filed Weights   09/02/20 1538  Weight: 127 kg   Weight change:   Intake/Output from previous day: 12/19 0701 - 12/20 0700 In: 2100 [IV Piggyback:2100] Out: -  Intake/Output this shift: Total I/O In: 843.8 [I.V.:843.8] Out: -   Examination: General exam: AAOX3,Obese,NAD, weak appearing. HEENT:Oral mucosa moist, Ear/Nose WNL  grossly,dentition normal. Respiratory system: bilaterally CLEAR,no wheezing or crackles,no use of accessory muscle, non tender. Cardiovascular system: S1 & S2 +, regular, No JVD. Gastrointestinal system: Abdomen soft, NT,ND, BS+. Nervous System:Alert, awake, moving extremities and grossly nonfocal Extremities: Bilateral chronic lymphedema with significantly swollen bilateral lower extremities  upto thigh, Skin: No rashes,no icterus. MSK: Normal muscle bulk,tone, power  Data Reviewed: I have personally reviewed following labs and imaging studies CBC: Recent Labs  Lab 09/01/20 1503 09/02/20 1542 09/03/20 0415  WBC 11.9* 10.4 7.3  NEUTROABS  --  9.6* 6.5  HGB 13.3 11.0* 10.8*  HCT 41.4 33.9* 33.6*  MCV 96.5 96.3 94.9  PLT 176 123* 364*   Basic Metabolic Panel: Recent Labs  Lab 09/01/20 1503 09/02/20 1542 09/03/20 0415  NA 139 136 136  K 3.9 3.9 3.6  CL 102 102 103  CO2 26 22 24   GLUCOSE 123* 101* 78  BUN 17 20 17   CREATININE 1.25* 1.52* 1.18*  CALCIUM 9.4 8.5* 8.3*   GFR: Estimated Creatinine Clearance: 61.6 mL/min (A) (by C-G formula based on SCr of 1.18 mg/dL (H)). Liver Function Tests: Recent Labs  Lab 09/02/20 1542 09/03/20 0415  AST 35 34  ALT 18 18  ALKPHOS 82 81  BILITOT 1.3* 1.4*  PROT 7.6 7.5  ALBUMIN 3.3* 3.1*   No results for input(s): LIPASE, AMYLASE in the last 168 hours. No results for input(s): AMMONIA in the last 168 hours. Coagulation Profile: No results for input(s): INR, PROTIME in the last 168 hours. Cardiac Enzymes: No results for input(s): CKTOTAL, CKMB, CKMBINDEX, TROPONINI in the last 168 hours. BNP (last 3 results) No results for input(s): PROBNP in the last 8760 hours. HbA1C: No results for input(s): HGBA1C in the last 72 hours. CBG: No results for input(s): GLUCAP in the last 168 hours. Lipid Profile: No results for input(s): CHOL, HDL, LDLCALC, TRIG, CHOLHDL, LDLDIRECT in the last 72 hours. Thyroid Function Tests: No results  for input(s): TSH, T4TOTAL, FREET4, T3FREE, THYROIDAB in the  last 72 hours. Anemia Panel: No results for input(s): VITAMINB12, FOLATE, FERRITIN, TIBC, IRON, RETICCTPCT in the last 72 hours. Sepsis Labs: Recent Labs  Lab 09/02/20 1542 09/02/20 1725 09/03/20 0415  LATICACIDVEN 2.9* 2.8* 1.0    Recent Results (from the past 240 hour(s))  Resp Panel by RT-PCR (Flu A&B, Covid) Nasopharyngeal Swab     Status: None   Collection Time: 09/02/20  4:44 PM   Specimen: Nasopharyngeal Swab; Nasopharyngeal(NP) swabs in vial transport medium  Result Value Ref Range Status   SARS Coronavirus 2 by RT PCR NEGATIVE NEGATIVE Final    Comment: (NOTE) SARS-CoV-2 target nucleic acids are NOT DETECTED.  The SARS-CoV-2 RNA is generally detectable in upper respiratory specimens during the acute phase of infection. The lowest concentration of SARS-CoV-2 viral copies this assay can detect is 138 copies/mL. A negative result does not preclude SARS-Cov-2 infection and should not be used as the sole basis for treatment or other patient management decisions. A negative result may occur with  improper specimen collection/handling, submission of specimen other than nasopharyngeal swab, presence of viral mutation(s) within the areas targeted by this assay, and inadequate number of viral copies(<138 copies/mL). A negative result must be combined with clinical observations, patient history, and epidemiological information. The expected result is Negative.  Fact Sheet for Patients:  EntrepreneurPulse.com.au  Fact Sheet for Healthcare Providers:  IncredibleEmployment.be  This test is no t yet approved or cleared by the [place] FDA and  has been authorized for detection and/or diagnosis of SARS-CoV-2 by FDA under an Emergency Use Authorization (EUA). This EUA will remain  in effect (meaning this test can be used) for the duration of the COVID-19 declaration under Section  564(b)(1) of the Act, 21 U.S.C.section 360bbb-3(b)(1), unless the authorization is terminated  or revoked sooner.       Influenza A by PCR NEGATIVE NEGATIVE Final   Influenza B by PCR NEGATIVE NEGATIVE Final    Comment: (NOTE) The Xpert Xpress SARS-CoV-2/FLU/RSV plus assay is intended as an aid in the diagnosis of influenza from Nasopharyngeal swab specimens and should not be used as a sole basis for treatment. Nasal washings and aspirates are unacceptable for Xpert Xpress SARS-CoV-2/FLU/RSV testing.  Fact Sheet for Patients: EntrepreneurPulse.com.au  Fact Sheet for Healthcare Providers: IncredibleEmployment.be  This test is not yet approved or cleared by the [place] FDA and has been authorized for detection and/or diagnosis of SARS-CoV-2 by FDA under an Emergency Use Authorization (EUA). This EUA will remain in effect (meaning this test can be used) for the duration of the COVID-19 declaration under Section 564(b)(1) of the Act, 21 U.S.C. section 360bbb-3(b)(1), unless the authorization is terminated or revoked.  Performed at [facility], [address] [place], [place] [address]   Blood Culture ID Panel (Reflexed)     Status: Abnormal   Collection Time: 09/02/20  5:13 PM  Result Value Ref Range Status   Enterococcus faecalis NOT DETECTED NOT DETECTED Final   Enterococcus Faecium NOT DETECTED NOT DETECTED Final   Listeria monocytogenes NOT DETECTED NOT DETECTED Final   Staphylococcus species DETECTED (A) NOT DETECTED Final    Comment: CRITICAL RESULT CALLED TO, READ BACK BY AND VERIFIED WITH:  AMY THOMPSON AT 1004 09/03/20 SDR    Staphylococcus aureus (BCID) NOT DETECTED NOT DETECTED Final   Staphylococcus epidermidis DETECTED (A) NOT DETECTED Final    Comment: Methicillin (oxacillin) resistant coagulase negative staphylococcus. Possible blood culture contaminant (unless isolated from more than one blood culture draw or  clinical case suggests pathogenicity). No antibiotic treatment is indicated for blood  culture contaminants. CRITICAL RESULT CALLED TO, READ BACK BY AND VERIFIED WITH:  AMY THOMPSON AT 1004 09/03/20 SDR    Staphylococcus lugdunensis NOT DETECTED NOT DETECTED Final   Streptococcus species NOT DETECTED NOT DETECTED Final   Streptococcus agalactiae NOT DETECTED NOT DETECTED Final   Streptococcus pneumoniae NOT DETECTED NOT DETECTED Final   Streptococcus pyogenes NOT DETECTED NOT DETECTED Final   A.calcoaceticus-baumannii NOT DETECTED NOT DETECTED Final   Bacteroides fragilis NOT DETECTED NOT DETECTED Final   Enterobacterales NOT DETECTED NOT DETECTED Final   Enterobacter cloacae complex NOT DETECTED NOT DETECTED Final   Escherichia coli NOT DETECTED NOT DETECTED Final   Klebsiella aerogenes NOT DETECTED NOT DETECTED Final   Klebsiella oxytoca NOT DETECTED NOT DETECTED Final   Klebsiella pneumoniae NOT DETECTED NOT DETECTED Final   Proteus species NOT DETECTED NOT DETECTED Final   Salmonella species NOT DETECTED NOT DETECTED Final   Serratia marcescens NOT DETECTED NOT DETECTED Final   Haemophilus influenzae NOT DETECTED NOT DETECTED Final   Neisseria meningitidis NOT DETECTED NOT DETECTED Final   Pseudomonas aeruginosa NOT DETECTED NOT DETECTED Final   Stenotrophomonas maltophilia NOT DETECTED NOT DETECTED Final   Candida albicans NOT DETECTED NOT DETECTED Final   Candida auris NOT DETECTED NOT DETECTED Final   Candida glabrata NOT DETECTED NOT DETECTED Final   Candida krusei NOT DETECTED NOT DETECTED Final   Candida parapsilosis NOT DETECTED NOT DETECTED Final   Candida tropicalis NOT DETECTED NOT DETECTED Final   Cryptococcus neoformans/gattii NOT DETECTED NOT DETECTED Final   Methicillin resistance mecA/C DETECTED (A) NOT DETECTED Final    Comment: CRITICAL RESULT CALLED TO, READ BACK BY AND VERIFIED WITH:  AMY THOMPSON AT 1004 09/03/20 SDR Performed at [facility], [address] [place], [place] [address]   Blood culture (routine x 2)     Status: None (Preliminary result)   Collection Time: 09/02/20  5:25 PM   Specimen: BLOOD  Result Value Ref Range Status   Specimen Description BLOOD BLOOD RIGHT HAND  Final   Special Requests   Final    BOTTLES DRAWN AEROBIC AND ANAEROBIC Blood Culture results may not be optimal due to an inadequate volume of blood received in culture bottles   Culture  Setup Time   Final    Organism ID to follow GRAM POSITIVE COCCI IN CHAINS AEROBIC BOTTLE ONLY CRITICAL VALUE NOTED.  VALUE IS CONSISTENT WITH PREVIOUSLY REPORTED AND CALLED VALUE. GRAM POSITIVE COCCI ANAEROBIC BOTTLE ONLY CRITICAL RESULT CALLED TO, READ BACK BY AND VERIFIED WITH: AMY THOMPSON AT 1004 09/03/20 SDR Performed at [facility], [address] [place],  [address]    Culture   Final    GRAM POSITIVE COCCI IN CHAINS AEROBIC BOTTLE ONLY GRAM POSITIVE COCCI ANAEROBIC BOTTLE ONLY    Report Status PENDING  Incomplete  Blood culture (routine x 2)     Status: None (Preliminary result)   Collection Time: 09/02/20  5:25 PM   Specimen: BLOOD  Result Value Ref Range Status   Specimen Description BLOOD BLOOD LEFT HAND  Final   Special Requests   Final    BOTTLES DRAWN AEROBIC AND ANAEROBIC Blood Culture results may not be optimal due to an inadequate volume of blood received in culture bottles   Culture  Setup Time   Final    Organism ID to follow GRAM POSITIVE COCCI IN CHAINS IN BOTH AEROBIC AND ANAEROBIC BOTTLES CRITICAL  RESULT CALLED TO, READ BACK BY AND VERIFIED WITH: JASON ROBBINS AT 0715 09/03/20 SDR Performed at [facility], [address] [place], [place] [address]    Culture GRAM POSITIVE COCCI IN CHAINS  Final   Report Status PENDING  Incomplete  Blood Culture ID Panel (Reflexed)     Status: Abnormal   Collection Time: 09/02/20  5:25 PM  Result Value Ref Range Status   Enterococcus faecalis NOT DETECTED NOT DETECTED  Final   Enterococcus Faecium NOT DETECTED NOT DETECTED Final   Listeria monocytogenes NOT DETECTED NOT DETECTED Final   Staphylococcus species NOT DETECTED NOT DETECTED Final   Staphylococcus aureus (BCID) NOT DETECTED NOT DETECTED Final   Staphylococcus epidermidis NOT DETECTED NOT DETECTED Final   Staphylococcus lugdunensis NOT DETECTED NOT DETECTED Final   Streptococcus species DETECTED (A) NOT DETECTED Final    Comment: CRITICAL RESULT CALLED TO, READ BACK BY AND VERIFIED WITH: JASON ROBBINS AT 0715 09/03/20 SDR    Streptococcus agalactiae DETECTED (A) NOT DETECTED Final    Comment: CRITICAL RESULT CALLED TO, READ BACK BY AND VERIFIED WITH:  JASON ROBBINS AT 0715 09/03/20 SDR    Streptococcus pneumoniae NOT DETECTED NOT DETECTED Final   Streptococcus pyogenes NOT DETECTED NOT DETECTED Final   A.calcoaceticus-baumannii NOT DETECTED NOT DETECTED Final   Bacteroides fragilis NOT DETECTED NOT DETECTED Final   Enterobacterales NOT DETECTED NOT DETECTED Final   Enterobacter cloacae complex NOT DETECTED NOT DETECTED Final   Escherichia coli NOT DETECTED NOT DETECTED Final   Klebsiella aerogenes NOT DETECTED NOT DETECTED Final   Klebsiella oxytoca NOT DETECTED NOT DETECTED Final   Klebsiella pneumoniae NOT DETECTED NOT DETECTED Final   Proteus species NOT DETECTED NOT DETECTED Final   Salmonella species NOT DETECTED NOT DETECTED Final   Serratia marcescens NOT DETECTED NOT DETECTED Final   Haemophilus influenzae NOT DETECTED NOT DETECTED Final   Neisseria meningitidis NOT DETECTED NOT DETECTED Final   Pseudomonas aeruginosa NOT DETECTED NOT DETECTED Final   Stenotrophomonas maltophilia NOT DETECTED NOT DETECTED Final   Candida albicans NOT DETECTED NOT DETECTED Final   Candida auris NOT DETECTED NOT DETECTED Final   Candida glabrata NOT DETECTED NOT DETECTED Final   Candida krusei NOT DETECTED NOT DETECTED Final   Candida parapsilosis NOT DETECTED NOT DETECTED Final   Candida  tropicalis NOT DETECTED NOT DETECTED Final   Cryptococcus neoformans/gattii NOT DETECTED NOT DETECTED Final    Comment: Performed at [facility], [address] [place], [place] [address]     Radiology Studies: DG Chest 2 View  Result Date: 09/02/2020 CLINICAL DATA:  Fever EXAM: CHEST - 2 VIEW COMPARISON:  September 01, 2020 FINDINGS: Again noted is cardiomegaly with prominent interstitial lung markings and findings suggestive of congestive heart failure. The lung volumes are somewhat low. There are probable small bilateral pleural effusions. There are end-stage degenerative changes of the right shoulder. IMPRESSION: Cardiomegaly with persistent congestive heart failure. An underlying atypical infectious process is not entirely excluded. Electronically Signed   By: Constance Holster M.D.   On: 09/02/2020 17:36   DG Pelvis Portable  Result Date: 09/02/2020 CLINICAL DATA:  Fall. EXAM: PORTABLE PELVIS 1-2 VIEWS COMPARISON:  December 11, 2015 FINDINGS: The patient is status post total hip arthroplasty on the left. There are end-stage degenerative changes of the right hip. There is no evidence for an acute displaced fracture or dislocation. IMPRESSION: 1. No acute displaced fracture or dislocation. 2. End-stage degenerative changes of the right hip. Electronically Signed  By: Constance Holster M.D.   On: 09/02/2020 17:35   DG Chest Portable 1 View  Result Date: 09/01/2020 CLINICAL DATA:  Chills, leg pain EXAM: PORTABLE CHEST 1 VIEW COMPARISON:  10/27/2019 FINDINGS: Single frontal view of the chest demonstrates a stable cardiac silhouette. Central vascular congestion without airspace disease, effusion, or pneumothorax. Chronic elevation of the right hemidiaphragm. No acute bony abnormalities. IMPRESSION: 1. Central vascular congestion without airspace disease. Electronically Signed   By: Randa Ngo M.D.   On: 09/01/2020 21:16     LOS: 0 days   Antonieta Pert, MD [facility]  09/03/2020, 11:09 AM

## 2020-09-04 ENCOUNTER — Encounter: Payer: Self-pay | Admitting: Internal Medicine

## 2020-09-04 DIAGNOSIS — A419 Sepsis, unspecified organism: Secondary | ICD-10-CM | POA: Diagnosis not present

## 2020-09-04 LAB — COMPREHENSIVE METABOLIC PANEL
ALT: 18 U/L (ref 0–44)
AST: 27 U/L (ref 15–41)
Albumin: 3 g/dL — ABNORMAL LOW (ref 3.5–5.0)
Alkaline Phosphatase: 81 U/L (ref 38–126)
Anion gap: 8 (ref 5–15)
BUN: 15 mg/dL (ref 8–23)
CO2: 26 mmol/L (ref 22–32)
Calcium: 8.7 mg/dL — ABNORMAL LOW (ref 8.9–10.3)
Chloride: 102 mmol/L (ref 98–111)
Creatinine, Ser: 1.19 mg/dL — ABNORMAL HIGH (ref 0.44–1.00)
GFR, Estimated: 48 mL/min — ABNORMAL LOW (ref >60)
Glucose, Bld: 96 mg/dL (ref 70–99)
Potassium: 3.6 mmol/L (ref 3.5–5.1)
Sodium: 136 mmol/L (ref 135–145)
Total Bilirubin: 1.1 mg/dL (ref 0.3–1.2)
Total Protein: 7.5 g/dL (ref 6.5–8.1)

## 2020-09-04 LAB — CBC WITH DIFFERENTIAL/PLATELET
Abs Immature Granulocytes: 0.05 10*3/uL (ref 0.00–0.07)
Basophils Absolute: 0 10*3/uL (ref 0.0–0.1)
Basophils Relative: 0 %
Eosinophils Absolute: 0.3 10*3/uL (ref 0.0–0.5)
Eosinophils Relative: 4 %
HCT: 32.8 % — ABNORMAL LOW (ref 36.0–46.0)
Hemoglobin: 10.7 g/dL — ABNORMAL LOW (ref 12.0–15.0)
Immature Granulocytes: 1 %
Lymphocytes Relative: 8 %
Lymphs Abs: 0.6 10*3/uL — ABNORMAL LOW (ref 0.7–4.0)
MCH: 31.2 pg (ref 26.0–34.0)
MCHC: 32.6 g/dL (ref 30.0–36.0)
MCV: 95.6 fL (ref 80.0–100.0)
Monocytes Absolute: 0.5 10*3/uL (ref 0.1–1.0)
Monocytes Relative: 7 %
Neutro Abs: 5.7 10*3/uL (ref 1.7–7.7)
Neutrophils Relative %: 80 %
Platelets: 110 10*3/uL — ABNORMAL LOW (ref 150–400)
RBC: 3.43 MIL/uL — ABNORMAL LOW (ref 3.87–5.11)
RDW: 17.2 % — ABNORMAL HIGH (ref 11.5–15.5)
WBC: 7.1 10*3/uL (ref 4.0–10.5)
nRBC: 0 % (ref 0.0–0.2)

## 2020-09-04 LAB — URINE CULTURE: Culture: 70000 — AB

## 2020-09-04 MED ORDER — BENZONATATE 100 MG PO CAPS
200.0000 mg | ORAL_CAPSULE | Freq: Two times a day (BID) | ORAL | Status: DC | PRN
  Administered 2020-09-04 – 2020-09-10 (×6): 200 mg via ORAL
  Filled 2020-09-04 (×5): qty 2

## 2020-09-04 MED ORDER — HYDROCODONE-ACETAMINOPHEN 5-325 MG PO TABS
1.0000 | ORAL_TABLET | Freq: Three times a day (TID) | ORAL | Status: DC | PRN
  Administered 2020-09-04 – 2020-09-13 (×17): 1 via ORAL
  Filled 2020-09-04 (×18): qty 1

## 2020-09-04 NOTE — Progress Notes (Signed)
PHARMACY - PHYSICIAN COMMUNICATION CRITICAL VALUE ALERT - BLOOD CULTURE IDENTIFICATION (BCID)  Becky Trevino is an 74 y.o. female who presented to [facility] on 09/02/2020 with a chief complaint of fever, weakness, fall  Assessment:  Patient being treated for grp B strep bacteremia.  Blood culture updated with GPC in 1 of 4 bottles (BCID = MRSE).  ID following  Name of physician (or Provider) Contacted: Dr Delaine Lame  Current antibiotics: PCN G  Changes to prescribed antibiotics recommended:  Patient is on recommended antibiotics - No changes needed  Results for orders placed or performed during the hospital encounter of 09/02/20  Blood Culture ID Panel (Reflexed) (Collected: 09/02/2020  5:13 PM)  Result Value Ref Range   Enterococcus faecalis NOT DETECTED NOT DETECTED   Enterococcus Faecium NOT DETECTED NOT DETECTED   Listeria monocytogenes NOT DETECTED NOT DETECTED   Staphylococcus species DETECTED (A) NOT DETECTED   Staphylococcus aureus (BCID) NOT DETECTED NOT DETECTED   Staphylococcus epidermidis DETECTED (A) NOT DETECTED   Staphylococcus lugdunensis NOT DETECTED NOT DETECTED   Streptococcus species NOT DETECTED NOT DETECTED   Streptococcus agalactiae NOT DETECTED NOT DETECTED   Streptococcus pneumoniae NOT DETECTED NOT DETECTED   Streptococcus pyogenes NOT DETECTED NOT DETECTED   A.calcoaceticus-baumannii NOT DETECTED NOT DETECTED   Bacteroides fragilis NOT DETECTED NOT DETECTED   Enterobacterales NOT DETECTED NOT DETECTED   Enterobacter cloacae complex NOT DETECTED NOT DETECTED   Escherichia coli NOT DETECTED NOT DETECTED   Klebsiella aerogenes NOT DETECTED NOT DETECTED   Klebsiella oxytoca NOT DETECTED NOT DETECTED   Klebsiella pneumoniae NOT DETECTED NOT DETECTED   Proteus species NOT DETECTED NOT DETECTED   Salmonella species NOT DETECTED NOT DETECTED   Serratia marcescens NOT DETECTED NOT DETECTED   Haemophilus influenzae NOT DETECTED NOT DETECTED   Neisseria  meningitidis NOT DETECTED NOT DETECTED   Pseudomonas aeruginosa NOT DETECTED NOT DETECTED   Stenotrophomonas maltophilia NOT DETECTED NOT DETECTED   Candida albicans NOT DETECTED NOT DETECTED   Candida auris NOT DETECTED NOT DETECTED   Candida glabrata NOT DETECTED NOT DETECTED   Candida krusei NOT DETECTED NOT DETECTED   Candida parapsilosis NOT DETECTED NOT DETECTED   Candida tropicalis NOT DETECTED NOT DETECTED   Cryptococcus neoformans/gattii NOT DETECTED NOT DETECTED   Methicillin resistance mecA/C DETECTED (A) NOT DETECTED    Doreene Eland, PharmD, BCPS.   Work Cell: [phone] 09/04/2020 9:53 AM

## 2020-09-04 NOTE — Evaluation (Signed)
Physical Therapy Evaluation Patient Details Name: Becky Trevino MRN: [id number] DOB: [birth year] Today's Date: 09/04/2020   History of Present Illness  74 y.o. female with medical history significant of DDD/spondylosis of cervical spine, stage III chronic kidney disease, type II DM, diverticulosis, hyperlipidemia, hypertension, intent trigger, leg cramps, varicose veins of lower extremities, lymphedema, obesity, OSA, syncopal episode, chronic diastolic CHF who is coming to the emergency department via EMS 2 days in a row due to progressively worse chills, rigors, fever, frontal headache, lightheadedness, decreased appetite, fatigue and malaise, which she said started about a week ago.  The patient stated that she was having RLE pain due to sitting in her wheelchair.  She tried to get up, but her RLE was too weak to stand and fell.  Imaging of R hip reveals end stage arthritic changes.  Clinical Impression  Pt willing to work with PT and during information gathering she assures me she has been doing regular walking at home and with husband close (but only helping to get LEs on/off bed) she is able to walk to/from bathroom, etc.  However per her performance today and history of multiple falls and limitations during prior hospitalizations the veracity of this is questionable.  Regardless she was very limited today needing max assist with LE to get up to sitting and back to supine.  Attempts at standing were unsuccessful even from gradually increasing bed height (states she puts hospital bed up very high at home to get up (unsure how she does getting up from w/c given her profound weakness this session.)  Pt very limited with LE movement and has essentially no AROM proximal to ankles.  Unsafe/unable to stand, pt wanting to go home but understanding that based on today's performance she would be unsafe and unable to manage at home.  The only recommendation PT can make is for rehab.    Follow Up  Recommendations SNF;Supervision/Assistance - 24 hour    Equipment Recommendations   (discussed benefits of a lift chair)    Recommendations for Other Services       Precautions / Restrictions Precautions Precautions: Fall Restrictions Weight Bearing Restrictions: No      Mobility  Bed Mobility Overal bed mobility: Needs Assistance Bed Mobility: Supine to Sit;Sit to Supine     Supine to sit: Max assist Sit to supine: Max assist   General bed mobility comments: Total assist for b/l LEs to get up to EOB and to get back to supine.  She reports that husband does give total assist with LEs at baseline.  Able to maintain sitting at EOB once assisted to position and assisted with squaring to EOB    Transfers Overall transfer level: Needs assistance Equipment used: Rolling walker (2 wheeled) Transfers: Sit to/from Stand Sit to Stand: Total assist         General transfer comment: Multiple attempts at standing EOB with BRW, heavy cuing and gradually increasing bed height to assist (up ~1 ft by the final effort).  Each effort resulted in zero rising off bed despite very heavy assist from PT and pt ostensibly very eager to show ability to stand and go home.  Ambulation/Gait                Stairs            Wheelchair Mobility    Modified Rankin (Stroke Patients Only)       Balance Overall balance assessment: Needs assistance   Sitting balance-Leahy Scale: Good Sitting balance -  Comments: Pt did well with maintaining sitting EOB once assisted.     Standing balance-Leahy Scale: Zero Standing balance comment: unable to rise even minimally from EOB, showed no ability to attain/maintain standing this date                             Pertinent Vitals/Pain Pain Assessment: 0-10 Pain Score: 4  Pain Location: c/o more abdominal pain than hip pain    Home Living Family/patient expects to be discharged to:: Private residence Living Arrangements:  Spouse/significant other Available Help at Discharge: Family Type of Home: Apartment Home Access: Level entry     Home Layout: One level Home Equipment: Environmental consultant - 2 wheels;Cane - single point;Bedside commode;Hospital bed;Wheelchair - manual      Prior Function Level of Independence: Needs assistance   Gait / Transfers Assistance Needed: Pt reports that she is able to ambulate room to room in the home with husband with her.  States she is essentially never out of the home.  ADL's / Homemaking Assistance Needed: She states husband helps with some dressing/ADLs but asserts that she can still do most of this (?)        Hand Dominance        Extremity/Trunk Assessment   Upper Extremity Assessment Upper Extremity Assessment: Generalized weakness    Lower Extremity Assessment Lower Extremity Assessment: Generalized weakness (essentially no AROM in LEs, especially against gravity, pain with most hip/knee PROM)       Communication   Communication: No difficulties  Cognition   Behavior During Therapy: WFL for tasks assessed/performed Overall Cognitive Status: Within Functional Limits for tasks assessed                                 General Comments: Pt alert to self and situation      General Comments      Exercises     Assessment/Plan    PT Assessment Patient needs continued PT services  PT Problem List Decreased strength;Decreased range of motion;Decreased activity tolerance;Decreased balance;Decreased mobility;Decreased knowledge of use of DME;Decreased safety awareness;Pain;Obesity       PT Treatment Interventions DME instruction;Gait training;Functional mobility training;Therapeutic activities;Therapeutic exercise;Balance training;Neuromuscular re-education;Patient/family education    PT Goals (Current goals can be found in the Care Plan section)  Acute Rehab PT Goals Patient Stated Goal: Pt very much wants to go home/not want to go to rehab PT  Goal Formulation: With patient Time For Goal Achievement: 09/18/20 Potential to Achieve Goals: Fair    Frequency Min 2X/week   Barriers to discharge        Co-evaluation               AM-PAC PT "6 Clicks" Mobility  Outcome Measure Help needed turning from your back to your side while in a flat bed without using bedrails?: A Lot Help needed moving from lying on your back to sitting on the side of a flat bed without using bedrails?: Total Help needed moving to and from a bed to a chair (including a wheelchair)?: Total Help needed standing up from a chair using your arms (e.g., wheelchair or bedside chair)?: Total Help needed to walk in hospital room?: Total Help needed climbing 3-5 steps with a railing? : Total 6 Click Score: 7    End of Session Equipment Utilized During Treatment: Gait belt Activity Tolerance: Patient limited by pain;Patient limited  by fatigue Patient left: with bed alarm set;with call bell/phone within reach Nurse Communication: Mobility status PT Visit Diagnosis: Muscle weakness (generalized) (M62.81);Difficulty in walking, not elsewhere classified (R26.2);Pain;History of falling (Z91.81) Pain - Right/Left: Right Pain - part of body: Hip    Time: 3837-7939 PT Time Calculation (min) (ACUTE ONLY): 34 min   Charges:   PT Evaluation $PT Eval Low Complexity: 1 Low PT Treatments $Therapeutic Activity: 8-22 mins        Kreg Shropshire, DPT 09/04/2020, 1:14 PM

## 2020-09-04 NOTE — Evaluation (Signed)
Occupational Therapy Evaluation Patient Details Name: Becky Trevino MRN: [id number] DOB: 1946-07-05 Today's Date: 09/04/2020    History of Present Illness 74 y.o. female with medical history significant of DDD/spondylosis of cervical spine, stage III chronic kidney disease, type II DM, diverticulosis, hyperlipidemia, hypertension, intent trigger, leg cramps, varicose veins of lower extremities, lymphedema, obesity, OSA, syncopal episode, chronic diastolic CHF who is coming to the emergency department via EMS 2 days in a row due to progressively worse chills, rigors, fever, frontal headache, lightheadedness, decreased appetite, fatigue and malaise, which she said started about a week ago.  The patient stated that she was having RLE pain due to sitting in her wheelchair.  She tried to get up, but her RLE was too weak to stand and fell.   Clinical Impression   Ms. Vegh was seen for OT evaluation this date. Prior to hospital admission, pt required variable assist for functional mobility/ADL management, but reports on a good day she is able to perform limited household mobility with her walker, bathe herself independently, and family assists with dressing. Pt lives with her spouse in a 1-level apartment home with a level entry. Currently pt demonstrates impairments as described below (See OT problem list) which functionally limit her ability to perform ADL/self-care tasks. Pt currently requires MAX +2 for functional mobility/STS transfers, MAX A for bed mobility, and set-up/supervision for bed-level UB ADL management.  Pt would benefit from skilled OT services to address noted impairments and functional limitations (see below for any additional details) in order to maximize safety and independence while minimizing falls risk and caregiver burden. Upon hospital discharge, recommend STR to maximize pt safety and return to PLOF.      Follow Up Recommendations  SNF    Equipment Recommendations  Other  (comment) (TBD)    Recommendations for Other Services       Precautions / Restrictions Precautions Precautions: Fall Restrictions Weight Bearing Restrictions: No      Mobility Bed Mobility Overal bed mobility: Needs Assistance Bed Mobility: Supine to Sit;Sit to Supine     Supine to sit: Max assist Sit to supine: Max assist   General bed mobility comments: Deferred. Pt declines OOB/EOB activity this date 2/2 abdominal pain. Per PT note from this date, pt required heavy MAX-TOTAL A for BLE mgt during bed mobility with HOB raised.    Transfers Overall transfer level: Needs assistance Equipment used: Rolling walker (2 wheeled) Transfers: Sit to/from Stand Sit to Stand: Total assist         General transfer comment: Deferred. Pt unsafe/unable to attempt without +2 assist. See PT note.    Balance Overall balance assessment: Needs assistance   Sitting balance-Leahy Scale: Good Sitting balance - Comments: Pt did well with maintaining sitting EOB once assisted.     Standing balance-Leahy Scale: Zero Standing balance comment: unable to rise even minimally from EOB, showed no ability to attain/maintain standing this date                           ADL either performed or assessed with clinical judgement   ADL Overall ADL's : Needs assistance/impaired                                     Functional mobility during ADLs: +2 for physical assistance;Maximal assistance;Rolling walker General ADL Comments: Pt significantly functionally limited by increased abdominal pain  and generalized weakness. She requires MAX +2 for functional mobility. Anticipate MAX A for LB dressing with +2 assist for STS t/fs. Set-up/supervision for UB ADL management at bed level.     Vision Baseline Vision/History: Wears glasses Wears Glasses: Reading only Patient Visual Report: No change from baseline       Perception     Praxis      Pertinent Vitals/Pain Pain  Assessment: 0-10 Pain Score: 9  Pain Location: "pain in my waist" - abdomen Pain Descriptors / Indicators: Discomfort;Constant Pain Intervention(s): Limited activity within patient's tolerance;Monitored during session;Premedicated before session     Hand Dominance Right   Extremity/Trunk Assessment Upper Extremity Assessment Upper Extremity Assessment: Generalized weakness (No focal weakness appreciated grossly 3/5 t/o BUE, grip WFL, LUE notably swollen over dorsum of hand and up forearm with weeping noted from hand. Per RN, pt IV infultrated overnight. Pt assisted with positioning to maximize fluid return.)   Lower Extremity Assessment Lower Extremity Assessment: Generalized weakness;Defer to PT evaluation       Communication Communication Communication: No difficulties   Cognition Arousal/Alertness: Awake/alert Behavior During Therapy: WFL for tasks assessed/performed Overall Cognitive Status: Within Functional Limits for tasks assessed                                 General Comments: Pt presents A&Ox4; she is pleasant and conversational t/o session however limited by pain this date.   General Comments  LUE notably swollen with weeping appreciated from dorsum of hand. RN notified/aware. Pt assisted with positioning on pillow with clean chuck pad in place to absorb any moisture.    Exercises Exercises:  (R hip with default externally rotated posture, very little AROM t/o b/l LE activity.) Other Exercises Other Exercises: Pt educated on role of OT in acute setting, safe use of AE/DME for ADL management in the home including use of LH sponge, LH reacher, and handheld showerhead for improved independence with bathing and dressing tasks. Increased time to discuss falls prevention strategies.   Shoulder Instructions      Home Living Family/patient expects to be discharged to:: Private residence Living Arrangements: Spouse/significant other Available Help at  Discharge: Family;Personal care attendant (Pt reports she has PCA 5 days/week for several hours/day.) Type of Home: Apartment Home Access: Level entry     Home Layout: One level     Bathroom Shower/Tub: Teacher, early years/pre: Handicapped height     Home Equipment: Environmental consultant - 2 wheels;Cane - single point;Bedside commode;Hospital bed;Wheelchair - manual;Shower seat          Prior Functioning/Environment Level of Independence: Needs assistance  Gait / Transfers Assistance Needed: Pt endorses variable mobility levels depending on how she is feeling that day. She states that at times it can take up to two people (family members) to help her stand from her hospital bed or WC. Other days she is able to do it with far less assistance. Generally more limited after she has a doctors appointment and is fatigued from the trip to/from. Uses WC on these days. ADL's / Homemaking Assistance Needed: Pt states her husband helps her with LB dressing including donning her socks and shoes and will occasionaly help with getting a shirt on over her head. Otherwise she is independent with ADLs, uses a shower chair for bathing in her tub shower, and BSC for toileting at baseline.  OT Problem List: Decreased strength;Cardiopulmonary status limiting activity;Increased edema;Decreased activity tolerance;Decreased safety awareness;Decreased knowledge of use of DME or AE;Impaired balance (sitting and/or standing)      OT Treatment/Interventions: Self-care/ADL training;Therapeutic exercise;Therapeutic activities;DME and/or AE instruction;Patient/family education;Balance training;Energy conservation    OT Goals(Current goals can be found in the care plan section) Acute Rehab OT Goals Patient Stated Goal: Pt very much wants to go home/not want to go to rehab OT Goal Formulation: With patient Time For Goal Achievement: 09/18/20 Potential to Achieve Goals: Fair ADL Goals Pt Will Perform  Grooming: sitting;with set-up;with supervision Pt Will Transfer to Toilet: bedside commode;stand pivot transfer;with mod assist (c LRAD PRN for improved safety and functional indep.) Pt Will Perform Toileting - Clothing Manipulation and hygiene: sit to/from stand;with adaptive equipment;with mod assist (c LRAD PRN for improved safety and functional indep.) Additional ADL Goal #1: Pt will independently verbalize a plan to implement at least 3 learned falls prevention strategies into her daily routines/home environment for improved safety and functional independence upon hospital DC.  OT Frequency: Min 2X/week   Barriers to D/C: Inaccessible home environment;Decreased caregiver support          Co-evaluation              AM-PAC OT "6 Clicks" Daily Activity     Outcome Measure Help from another person eating meals?: A Little Help from another person taking care of personal grooming?: A Little Help from another person toileting, which includes using toliet, bedpan, or urinal?: A Lot Help from another person bathing (including washing, rinsing, drying)?: A Lot Help from another person to put on and taking off regular upper body clothing?: A Little Help from another person to put on and taking off regular lower body clothing?: A Lot 6 Click Score: 15   End of Session Nurse Communication: Other (comment);Mobility status (LUE swelling; OT facilitates positioning on pillow with clean chuck to absorb any moisture.)  Activity Tolerance: Patient limited by pain Patient left: in bed;with call bell/phone within reach;with bed alarm set  OT Visit Diagnosis: Other abnormalities of gait and mobility (R26.89);Muscle weakness (generalized) (M62.81);Pain Pain - Right/Left:  (Both) Pain - part of body:  (Abdomen)                Time: [id number] OT Time Calculation (min): 20 min Charges:  OT General Charges $OT Visit: 1 Visit OT Evaluation $OT Eval Moderate Complexity: 1 Mod OT Treatments $Self  Care/Home Management : 8-22 mins  Shara Blazing, M.S., OTR/L Ascom: [phone] 09/04/20, 3:30 PM

## 2020-09-04 NOTE — Progress Notes (Signed)
PROGRESS NOTE    Becky Trevino  UXN:[id number] DOB: 02-17-1946 DOA: 09/02/2020 PCP: [name], MD   Chief Complaint  Patient presents with  . Fever  . Fall  Brief Narrative: 74 year old female with history of DDD/spondylosis of cervical spine, stage III CKD, T2DM, diverticulosis, hyperlipidemia, hypertension, bilateral lower extremity extensive lymphedema bedbound, obesity/OSA, history of syncope, chronic diastolic CHF coming to the ED for second day in a row due to multiple complaints of progressive worsening chills, rigors, fever, frontal headache, lightheadedness, decreased appetite/fatigue/malaise 1 set about a week ago prior to admission.  Initially right lower extremity pain after a fall/from wheelchair, no LOC or head injury. As per EMS patient was hypotensive on arrival and given a liter of IV fluids. In the ED work-up showed UA hazy with large hemoglobinuria proteinuria microscopic urine exam shows RBC more than 50 WBC 6-10, WBC count 10.4 with 92% neutrophils, CMP showed normal electrolytes, elevated creatinine 1.5, lactic acid is 2.9. given IV fluids. CXR cardiomegaly with CHF ? Underlying infectious process,-BNP was obtained that was marginally up to 224, pelvis x-ray no acute fracture, RT Hip- end stage degenerative changes. Patient was admitted for AKI, sepsis due to undetermined etiology. blood cultures showing 3 out of 4 Strep agalactiae-ID was consulted-given patient's complicated MSK history and hx of sepsis.  Subjective: c/o pain on the left hip, overnight no fever.  WBC count remains a stable Hemodynamically stable.  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 Abx changed to penicillin. ID Dr Delaine Lame consulted and CT left hip  ordered to evaluate her left thigh and pending.   AKI on CKD stage IIIa-Baseline creatinine appears around 1-1.28.  On admission 1.5 and improved to 1.9.  Monitor closely.    Urine culture with gram-negative rods, follow-up culture,?  Significance not was a failure symptoms  Benign essential HTN: BP controlled.  Soft on admission so metoprolol held.  Resumed home midodrine.   T2DM-last hemoglobin A1c 5.5 back in September.  CBG in BMP is 81, holding off on sliding scale. HLD: cont statin and monitor LFTs.  Morbid obesity with BMI 39 : She will benefit with weight loss strategy but I'm not sure how much feasible given her bedbound status and significant lymphedema. OSA cont bedtime cpap Chronic pymphedemia with bilaterally extensive leg swelling, patient reports she has been bedbound since January.  Lasix on hold 2/2 sepsis, soft bp, aki on admission. Resume in 1-2 days  Cont home midodrine.  Hx of DVT- reports she had Filter placed and has been off eliquis by Dr Tasia Catchings per her. Normocytic anemia/Thrombocytopenia: Hemoglobin  10.7 gm and  Platelet 110k appears stable slightly on lower side 2/2 sepsis. Monitor.  Hyperbilirubinemia- tb slightly up ,monitor  Nutrition: Diet Order            Diet heart healthy/carb modified Room service appropriate? Yes; Fluid consistency: Thin  Diet effective now                 Body mass index is 39.05 kg/m.  DVT prophylaxis: lOVENOX Code Status:   Code Status: Full Code Family Communication: plan of care discussed with patient at bedside.  Status is: admitted as Observation Patient will need at least 2 midnight stay for ongoing management of her sepsis further  ID eval, will need to  IV antibiotics and was change inpatient  Dispo: The patient is from: Home              Anticipated d/c is to: TBD. PT eval.              Anticipated d/c date is: 2 days              Patient currently is not medically stable to d/c.   Consultants:see note   Procedures:see note  Culture/Microbiology    Component Value Date/Time   SDES BLOOD BLOOD RIGHT HAND 09/02/2020 1725   SDES BLOOD BLOOD LEFT HAND 09/02/2020 1725   SDES  09/02/2020 1725    URINE, RANDOM Performed at [facility], [address] [place], [place] [address]    [facility]  09/02/2020 1725    BOTTLES DRAWN AEROBIC AND ANAEROBIC Blood Culture results may not be optimal due to an inadequate volume of blood received in culture bottles   SPECREQUEST  09/02/2020 1725    BOTTLES DRAWN AEROBIC AND ANAEROBIC Blood Culture results may not be optimal due to an inadequate volume of blood received in culture bottles   SPECREQUEST  09/02/2020 1725    NONE Performed at [facility], [address] [place], [place] [address]    CULT  09/02/2020 1725    GRAM POSITIVE COCCI IN CHAINS AEROBIC BOTTLE ONLY GRAM POSITIVE COCCI ANAEROBIC BOTTLE ONLY    CULT GRAM POSITIVE COCCI IN CHAINS 09/02/2020 1725   CULT (A) 09/02/2020 1725    70,000 COLONIES/mL GRAM NEGATIVE RODS IDENTIFICATION AND SUSCEPTIBILITIES TO FOLLOW Performed at [facility], [place] [address], [place], [place] [address]    REPTSTATUS PENDING 09/02/2020 1725   REPTSTATUS PENDING 09/02/2020 1725   REPTSTATUS PENDING 09/02/2020 1725    Other culture-see note  Medications: Scheduled Meds: . aspirin EC  81 mg Oral Daily  . atorvastatin  40 mg Oral QPM  . calcium-vitamin D  1 tablet Oral BID  . doxycycline  100 mg Oral BID  . enoxaparin (LOVENOX) injection  0.5 mg/kg Subcutaneous Q24H  . fluticasone  2 spray Each Nare Daily  . imipramine  25 mg Oral QHS  . magnesium oxide  400 mg Oral Daily  . midodrine  2.5 mg Oral TID WC  . mirabegron ER  50 mg Oral Daily   Continuous Infusions: . penicillin g continuous IV infusion 9 Million Units (09/04/20 6283)    Antimicrobials: Anti-infectives (From admission, onward)   Start     Dose/Rate Route Frequency Ordered Stop   09/04/20 0000   cefTRIAXone (ROCEPHIN) 2 g in sodium chloride 0.9 % 100 mL IVPB  Status:  Discontinued        2 g 200 mL/hr over 30 Minutes Intravenous Every 24 hours 09/02/20 2230 09/03/20 0754   09/03/20 2000  azithromycin (ZITHROMAX) 500 mg in sodium chloride 0.9 % 250 mL IVPB  Status:  Discontinued        500 mg 250 mL/hr over 60 Minutes Intravenous Every 24 hours 09/02/20 2230 09/03/20 0754   09/03/20 1800  penicillin G potassium 9 Million Units in dextrose 5 % 500 mL continuous infusion        9 Million Units 41.7 mL/hr over 12 Hours Intravenous Every 12 hours 09/03/20 1558     09/03/20 1300  doxycycline (VIBRA-TABS) tablet 100 mg        100 mg Oral 2 times daily 09/03/20 1209     09/03/20  1000  penicillin G potassium 4 Million Units in dextrose 5 % 250 mL IVPB  Status:  Discontinued        4 Million Units 250 mL/hr over 60 Minutes Intravenous Every 4 hours 09/03/20 0754 09/03/20 1558   09/02/20 2245  cefTRIAXone (ROCEPHIN) 1 g in sodium chloride 0.9 % 100 mL IVPB        1 g 200 mL/hr over 30 Minutes Intravenous  Once 09/02/20 2230 09/03/20 0045   09/02/20 1915  cefTRIAXone (ROCEPHIN) 1 g in sodium chloride 0.9 % 100 mL IVPB        1 g 200 mL/hr over 30 Minutes Intravenous  Once 09/02/20 1905 09/02/20 2006   09/02/20 1915  azithromycin (ZITHROMAX) 500 mg in sodium chloride 0.9 % 250 mL IVPB        500 mg 250 mL/hr over 60 Minutes Intravenous  Once 09/02/20 1905 09/02/20 2110     Objective: Vitals: Today's Vitals   09/03/20 2311 09/03/20 2319 09/04/20 0447 09/04/20 0714  BP: (!) 125/59  124/68 137/80  Pulse: (!) 105  90 90  Resp: 20  16 16   Temp: 98.6 F (37 C)  98.4 F (36.9 C) 98.6 F (37 C)  TempSrc: Oral   Oral  SpO2: 97%  96% 98%  Weight:      Height:      PainSc:  9       Intake/Output Summary (Last 24 hours) at 09/04/2020 0744 Last data filed at 09/03/2020 1952 Gross per 24 hour  Intake 28.5 ml  Output 400 ml  Net -371.5 ml   Filed Weights   09/02/20 1538  Weight: 127  kg   Weight change:   Intake/Output from previous day: 12/20 0701 - 12/21 0700 In: 872.3 [I.V.:843.8; IV Piggyback:28.5] Out: 400 [Urine:400] Intake/Output this shift: No intake/output data recorded.  Examination:  General exam: AAOx3, obese, NAD, weak appearing. HEENT:Oral mucosa moist, Ear/Nose WNL grossly, dentition normal. Respiratory system: bilaterally clear,no wheezing or crackles,no use of accessory muscle Cardiovascular system: S1 & S2 +, No JVD,. Gastrointestinal system: Abdomen soft, NT,ND, BS+ Nervous System:Alert, awake, moving extremities and grossly nonfocal Extremities: No edema, distal peripheral pulses palpable.  Left thigh area slightly tender, erythematous Skin: No rashes,no icterus. MSK: Normal muscle bulk,tone, power   Data Reviewed: I have personally reviewed following labs and imaging studies CBC: Recent Labs  Lab 09/01/20 1503 09/02/20 1542 09/03/20 0415 09/04/20 0406  WBC 11.9* 10.4 7.3 7.1  NEUTROABS  --  9.6* 6.5 5.7  HGB 13.3 11.0* 10.8* 10.7*  HCT 41.4 33.9* 33.6* 32.8*  MCV 96.5 96.3 94.9 95.6  PLT 176 123* 111* 789*   Basic Metabolic Panel: Recent Labs  Lab 09/01/20 1503 09/02/20 1542 09/03/20 0415 09/04/20 0406  NA 139 136 136 136  K 3.9 3.9 3.6 3.6  CL 102 102 103 102  CO2 26 22 24 26   GLUCOSE 123* 101* 78 96  BUN 17 20 17 15   CREATININE 1.25* 1.52* 1.18* 1.19*  CALCIUM 9.4 8.5* 8.3* 8.7*   GFR: Estimated Creatinine Clearance: 61.1 mL/min (A) (by C-G formula based on SCr of 1.19 mg/dL (H)). Liver Function Tests: Recent Labs  Lab 09/02/20 1542 09/03/20 0415 09/04/20 0406  AST 35 34 27  ALT 18 18 18   ALKPHOS 82 81 81  BILITOT 1.3* 1.4* 1.1  PROT 7.6 7.5 7.5  ALBUMIN 3.3* 3.1* 3.0*   No results for input(s): LIPASE, AMYLASE in the last 168 hours. No results for input(s): AMMONIA in  the last 168 hours. Coagulation Profile: No results for input(s): INR, PROTIME in the last 168 hours. Cardiac Enzymes: No results for  input(s): CKTOTAL, CKMB, CKMBINDEX, TROPONINI in the last 168 hours. BNP (last 3 results) No results for input(s): PROBNP in the last 8760 hours. HbA1C: No results for input(s): HGBA1C in the last 72 hours. CBG: No results for input(s): GLUCAP in the last 168 hours. Lipid Profile: No results for input(s): CHOL, HDL, LDLCALC, TRIG, CHOLHDL, LDLDIRECT in the last 72 hours. Thyroid Function Tests: No results for input(s): TSH, T4TOTAL, FREET4, T3FREE, THYROIDAB in the last 72 hours. Anemia Panel: No results for input(s): VITAMINB12, FOLATE, FERRITIN, TIBC, IRON, RETICCTPCT in the last 72 hours. Sepsis Labs: Recent Labs  Lab 09/02/20 1542 09/02/20 1725 09/03/20 0415  LATICACIDVEN 2.9* 2.8* 1.0    Recent Results (from the past 240 hour(s))  Resp Panel by RT-PCR (Flu A&B, Covid) Nasopharyngeal Swab     Status: None   Collection Time: 09/02/20  4:44 PM   Specimen: Nasopharyngeal Swab; Nasopharyngeal(NP) swabs in vial transport medium  Result Value Ref Range Status   SARS Coronavirus 2 by RT PCR NEGATIVE NEGATIVE Final    Comment: (NOTE) SARS-CoV-2 target nucleic acids are NOT DETECTED.  The SARS-CoV-2 RNA is generally detectable in upper respiratory specimens during the acute phase of infection. The lowest concentration of SARS-CoV-2 viral copies this assay can detect is 138 copies/mL. A negative result does not preclude SARS-Cov-2 infection and should not be used as the sole basis for treatment or other patient management decisions. A negative result may occur with  improper specimen collection/handling, submission of specimen other than nasopharyngeal swab, presence of viral mutation(s) within the areas targeted by this assay, and inadequate number of viral copies(<138 copies/mL). A negative result must be combined with clinical observations, patient history, and epidemiological information. The expected result is Negative.  Fact Sheet for Patients:   EntrepreneurPulse.com.au  Fact Sheet for Healthcare Providers:  IncredibleEmployment.be  This test is no t yet approved or cleared by the [place] FDA and  has been authorized for detection and/or diagnosis of SARS-CoV-2 by FDA under an Emergency Use Authorization (EUA). This EUA will remain  in effect (meaning this test can be used) for the duration of the COVID-19 declaration under Section 564(b)(1) of the Act, 21 U.S.C.section 360bbb-3(b)(1), unless the authorization is terminated  or revoked sooner.       Influenza A by PCR NEGATIVE NEGATIVE Final   Influenza B by PCR NEGATIVE NEGATIVE Final    Comment: (NOTE) The Xpert Xpress SARS-CoV-2/FLU/RSV plus assay is intended as an aid in the diagnosis of influenza from Nasopharyngeal swab specimens and should not be used as a sole basis for treatment. Nasal washings and aspirates are unacceptable for Xpert Xpress SARS-CoV-2/FLU/RSV testing.  Fact Sheet for Patients: EntrepreneurPulse.com.au  Fact Sheet for Healthcare Providers: IncredibleEmployment.be  This test is not yet approved or cleared by the [place] FDA and has been authorized for detection and/or diagnosis of SARS-CoV-2 by FDA under an Emergency Use Authorization (EUA). This EUA will remain in effect (meaning this test can be used) for the duration of the COVID-19 declaration under Section 564(b)(1) of the Act, 21 U.S.C. section 360bbb-3(b)(1), unless the authorization is terminated or revoked.  Performed at [facility], [address], [place], [place] [address]   Blood Culture ID Panel (Reflexed)     Status: Abnormal   Collection Time: 09/02/20  5:13 PM  Result Value Ref Range Status  Enterococcus faecalis NOT DETECTED NOT DETECTED Final   Enterococcus Faecium NOT DETECTED NOT DETECTED Final   Listeria monocytogenes NOT DETECTED NOT DETECTED Final   Staphylococcus  species DETECTED (A) NOT DETECTED Final    Comment: CRITICAL RESULT CALLED TO, READ BACK BY AND VERIFIED WITH:  AMY THOMPSON AT 1004 09/03/20 SDR    Staphylococcus aureus (BCID) NOT DETECTED NOT DETECTED Final   Staphylococcus epidermidis DETECTED (A) NOT DETECTED Final    Comment: Methicillin (oxacillin) resistant coagulase negative staphylococcus. Possible blood culture contaminant (unless isolated from more than one blood culture draw or clinical case suggests pathogenicity). No antibiotic treatment is indicated for blood  culture contaminants. CRITICAL RESULT CALLED TO, READ BACK BY AND VERIFIED WITH:  AMY THOMPSON AT 1004 09/03/20 SDR    Staphylococcus lugdunensis NOT DETECTED NOT DETECTED Final   Streptococcus species NOT DETECTED NOT DETECTED Final   Streptococcus agalactiae NOT DETECTED NOT DETECTED Final   Streptococcus pneumoniae NOT DETECTED NOT DETECTED Final   Streptococcus pyogenes NOT DETECTED NOT DETECTED Final   A.calcoaceticus-baumannii NOT DETECTED NOT DETECTED Final   Bacteroides fragilis NOT DETECTED NOT DETECTED Final   Enterobacterales NOT DETECTED NOT DETECTED Final   Enterobacter cloacae complex NOT DETECTED NOT DETECTED Final   Escherichia coli NOT DETECTED NOT DETECTED Final   Klebsiella aerogenes NOT DETECTED NOT DETECTED Final   Klebsiella oxytoca NOT DETECTED NOT DETECTED Final   Klebsiella pneumoniae NOT DETECTED NOT DETECTED Final   Proteus species NOT DETECTED NOT DETECTED Final   Salmonella species NOT DETECTED NOT DETECTED Final   Serratia marcescens NOT DETECTED NOT DETECTED Final   Haemophilus influenzae NOT DETECTED NOT DETECTED Final   Neisseria meningitidis NOT DETECTED NOT DETECTED Final   Pseudomonas aeruginosa NOT DETECTED NOT DETECTED Final   Stenotrophomonas maltophilia NOT DETECTED NOT DETECTED Final   Candida albicans NOT DETECTED NOT DETECTED Final   Candida auris NOT DETECTED NOT DETECTED Final   Candida glabrata NOT DETECTED NOT  DETECTED Final   Candida krusei NOT DETECTED NOT DETECTED Final   Candida parapsilosis NOT DETECTED NOT DETECTED Final   Candida tropicalis NOT DETECTED NOT DETECTED Final   Cryptococcus neoformans/gattii NOT DETECTED NOT DETECTED Final   Methicillin resistance mecA/C DETECTED (A) NOT DETECTED Final    Comment: CRITICAL RESULT CALLED TO, READ BACK BY AND VERIFIED WITH:  AMY THOMPSON AT 1004 09/03/20 SDR Performed at [facility], [address] [place], [place] [address]   Blood culture (routine x 2)     Status: None (Preliminary result)   Collection Time: 09/02/20  5:25 PM   Specimen: BLOOD  Result Value Ref Range Status   Specimen Description BLOOD BLOOD RIGHT HAND  Final   Special Requests   Final    BOTTLES DRAWN AEROBIC AND ANAEROBIC Blood Culture results may not be optimal due to an inadequate volume of blood received in culture bottles   Culture  Setup Time   Final    Organism ID to follow GRAM POSITIVE COCCI IN CHAINS AEROBIC BOTTLE ONLY CRITICAL VALUE NOTED.  VALUE IS CONSISTENT WITH PREVIOUSLY REPORTED AND CALLED VALUE. GRAM POSITIVE COCCI ANAEROBIC BOTTLE ONLY CRITICAL RESULT CALLED TO, READ BACK BY AND VERIFIED WITH: AMY THOMPSON AT 1004 09/03/20 SDR Performed at [facility], [address] [place], [place] [address]    Culture   Final    GRAM POSITIVE COCCI IN CHAINS AEROBIC BOTTLE ONLY GRAM POSITIVE COCCI ANAEROBIC BOTTLE ONLY    Report Status PENDING  Incomplete  Blood culture (  routine x 2)     Status: None (Preliminary result)   Collection Time: 09/02/20  5:25 PM   Specimen: BLOOD  Result Value Ref Range Status   Specimen Description BLOOD BLOOD LEFT HAND  Final   Special Requests   Final    BOTTLES DRAWN AEROBIC AND ANAEROBIC Blood Culture results may not be optimal due to an inadequate volume of blood received in culture bottles   Culture  Setup Time   Final    Organism ID to follow GRAM POSITIVE COCCI IN CHAINS IN BOTH AEROBIC AND  ANAEROBIC BOTTLES CRITICAL RESULT CALLED TO, READ BACK BY AND VERIFIED WITHVioleta Gelinas AT [place] 09/03/20 [place] Performed at [facility], [address], [place], [place] [address]    Culture GRAM POSITIVE COCCI IN CHAINS  Final   Report Status PENDING  Incomplete  Urine Culture     Status: Abnormal (Preliminary result)   Collection Time: 09/02/20  5:25 PM   Specimen: Urine, Random  Result Value Ref Range Status   Specimen Description   Final    URINE, RANDOM Performed at [facility], [address] [place], [place] [address]    Special Requests   Final    NONE Performed at [facility], [address] [place], [place] [address]    Culture (A)  Final    70,000 COLONIES/mL Lonell Grandchild NEGATIVE RODS IDENTIFICATION AND SUSCEPTIBILITIES TO FOLLOW Performed at [facility], [place] [address] [place], [place] [address]    Report Status PENDING  Incomplete  Blood Culture ID Panel (Reflexed)     Status: Abnormal   Collection Time: 09/02/20  5:25 PM  Result Value Ref Range Status   Enterococcus faecalis NOT DETECTED NOT DETECTED Final   Enterococcus Faecium NOT DETECTED NOT DETECTED Final   Listeria monocytogenes NOT DETECTED NOT DETECTED Final   Staphylococcus species NOT DETECTED NOT DETECTED Final   Staphylococcus aureus (BCID) NOT DETECTED NOT DETECTED Final   Staphylococcus epidermidis NOT DETECTED NOT DETECTED Final   Staphylococcus lugdunensis NOT DETECTED NOT DETECTED Final   Streptococcus species DETECTED (A) NOT DETECTED Final    Comment: CRITICAL RESULT CALLED TO, READ BACK BY AND VERIFIED WITH: JASON ROBBINS AT 0715 09/03/20 SDR    Streptococcus agalactiae DETECTED (A) NOT DETECTED Final    Comment: CRITICAL RESULT CALLED TO, READ BACK BY AND VERIFIED WITH:  JASON ROBBINS AT 0715 09/03/20 SDR    Streptococcus pneumoniae NOT DETECTED NOT DETECTED Final   Streptococcus pyogenes NOT DETECTED NOT DETECTED Final   A.calcoaceticus-baumannii NOT  DETECTED NOT DETECTED Final   Bacteroides fragilis NOT DETECTED NOT DETECTED Final   Enterobacterales NOT DETECTED NOT DETECTED Final   Enterobacter cloacae complex NOT DETECTED NOT DETECTED Final   Escherichia coli NOT DETECTED NOT DETECTED Final   Klebsiella aerogenes NOT DETECTED NOT DETECTED Final   Klebsiella oxytoca NOT DETECTED NOT DETECTED Final   Klebsiella pneumoniae NOT DETECTED NOT DETECTED Final   Proteus species NOT DETECTED NOT DETECTED Final   Salmonella species NOT DETECTED NOT DETECTED Final   Serratia marcescens NOT DETECTED NOT DETECTED Final   Haemophilus influenzae NOT DETECTED NOT DETECTED Final   Neisseria meningitidis NOT DETECTED NOT DETECTED Final   Pseudomonas aeruginosa NOT DETECTED NOT DETECTED Final   Stenotrophomonas maltophilia NOT DETECTED NOT DETECTED Final   Candida albicans NOT DETECTED NOT DETECTED Final   Candida auris NOT DETECTED NOT DETECTED Final   Candida glabrata NOT DETECTED NOT DETECTED Final   Candida krusei  NOT DETECTED NOT DETECTED Final   Candida parapsilosis NOT DETECTED NOT DETECTED Final   Candida tropicalis NOT DETECTED NOT DETECTED Final   Cryptococcus neoformans/gattii NOT DETECTED NOT DETECTED Final    Comment: Performed at [facility], [address] [place], [place] [address]     Radiology Studies: DG Chest 2 View  Result Date: 09/02/2020 CLINICAL DATA:  Fever EXAM: CHEST - 2 VIEW COMPARISON:  September 01, 2020 FINDINGS: Again noted is cardiomegaly with prominent interstitial lung markings and findings suggestive of congestive heart failure. The lung volumes are somewhat low. There are probable small bilateral pleural effusions. There are end-stage degenerative changes of the right shoulder. IMPRESSION: Cardiomegaly with persistent congestive heart failure. An underlying atypical infectious process is not entirely excluded. Electronically Signed   By: Constance Holster M.D.   On: 09/02/2020 17:36   DG Pelvis  Portable  Result Date: 09/02/2020 CLINICAL DATA:  Fall. EXAM: PORTABLE PELVIS 1-2 VIEWS COMPARISON:  December 11, 2015 FINDINGS: The patient is status post total hip arthroplasty on the left. There are end-stage degenerative changes of the right hip. There is no evidence for an acute displaced fracture or dislocation. IMPRESSION: 1. No acute displaced fracture or dislocation. 2. End-stage degenerative changes of the right hip. Electronically Signed   By: Constance Holster M.D.   On: 09/02/2020 17:35     LOS: 1 day   Antonieta Pert, MD [facility]  09/04/2020, 7:44 AM

## 2020-09-04 NOTE — Progress Notes (Addendum)
Date of Admission:  09/02/2020      ID: Becky Trevino is a 74 y.o. female  Principal Problem:   Sepsis due to undetermined organism [facility]) Active Problems:   Benign essential HTN   Chronic kidney disease (CKD), stage III (moderate) (HCC)   Dyslipidemia   OSA (obstructive sleep apnea)   Type 2 diabetes mellitus with diabetic neuropathy, without long-term current use of insulin (HCC)   Normocytic anemia   Thrombocytopenia (HCC)   Mild protein-calorie malnutrition (HCC)   Hyperbilirubinemia   Sepsis ([place])    Subjective: Says she is feeling much better No fever or chills  Medications:  . aspirin EC  81 mg Oral Daily  . atorvastatin  40 mg Oral QPM  . calcium-vitamin D  1 tablet Oral BID  . doxycycline  100 mg Oral BID  . enoxaparin (LOVENOX) injection  0.5 mg/kg Subcutaneous Q24H  . fluticasone  2 spray Each Nare Daily  . imipramine  25 mg Oral QHS  . magnesium oxide  400 mg Oral Daily  . midodrine  2.5 mg Oral TID WC  . mirabegron ER  50 mg Oral Daily    Objective: Vital signs in last 24 hours: Temp:  [98.2 F (36.8 C)-99 F (37.2 C)] 99 F (37.2 C) (12/21 1124) Pulse Rate:  [90-105] 91 (12/21 1124) Resp:  [15-20] 15 (12/21 1124) BP: (100-137)/(43-80) 122/65 (12/21 1124) SpO2:  [96 %-100 %] 97 % (12/21 1124)  PHYSICAL EXAM:  General: Alert, cooperative, no distress, appears stated age.  Head: Normocephalic, without obvious abnormality, atraumatic. Eyes: Conjunctivae clear, anicteric sclerae. Pupils are equal ENT Nares normal. No drainage or sinus tenderness. Lips, mucosa, and tongue normal. No Thrush Neck: Supple, symmetrical, no adenopathy, thyroid: non tender no carotid bruit and no JVD. Back: No CVA tenderness. Lungs: Clear to auscultation bilaterally. No Wheezing or Rhonchi. No rales. Heart: Regular rate and rhythm, no murmur, rub or gallop. Abdomen: Soft, non-tender,not distended. Bowel sounds normal. No masses Extremities: edema legs - left thigh  erythema and induration better Rt knee scar Skin: No rashes or lesions. Or bruising Lymph: Cervical, supraclavicular normal. Neurologic: Grossly non-focal  Lab Results Recent Labs    09/03/20 0415 09/04/20 0406  WBC 7.3 7.1  HGB 10.8* 10.7*  HCT 33.6* 32.8*  NA 136 136  K 3.6 3.6  CL 103 102  CO2 24 26  BUN 17 15  CREATININE 1.18* 1.19*   Liver Panel Recent Labs    09/03/20 0415 09/04/20 0406  PROT 7.5 7.5  ALBUMIN 3.1* 3.0*  AST 34 27  ALT 18 18  ALKPHOS 81 81  BILITOT 1.4* 1.1   Sedimentation Rate No results for input(s): ESRSEDRATE in the last 72 hours. C-Reactive Protein No results for input(s): CRP in the last 72 hours.  Microbiology:  Studies/Results: DG Chest 2 View  Result Date: 09/02/2020 CLINICAL DATA:  Fever EXAM: CHEST - 2 VIEW COMPARISON:  September 01, 2020 FINDINGS: Again noted is cardiomegaly with prominent interstitial lung markings and findings suggestive of congestive heart failure. The lung volumes are somewhat low. There are probable small bilateral pleural effusions. There are end-stage degenerative changes of the right shoulder. IMPRESSION: Cardiomegaly with persistent congestive heart failure. An underlying atypical infectious process is not entirely excluded. Electronically Signed   By: Constance Holster M.D.   On: 09/02/2020 17:36   DG Pelvis Portable  Result Date: 09/02/2020 CLINICAL DATA:  Fall. EXAM: PORTABLE PELVIS 1-2 VIEWS COMPARISON:  December 11, 2015 FINDINGS: The patient  is status post total hip arthroplasty on the left. There are end-stage degenerative changes of the right hip. There is no evidence for an acute displaced fracture or dislocation. IMPRESSION: 1. No acute displaced fracture or dislocation. 2. End-stage degenerative changes of the right hip. Electronically Signed   By: Constance Holster M.D.   On: 09/02/2020 17:35   CT HIP LEFT WO CONTRAST  Result Date: 09/04/2020 CLINICAL DATA:  Septic arthritis EXAM: CT OF THE  LEFT HIP WITHOUT CONTRAST TECHNIQUE: Multidetector CT imaging of the left hip was performed according to the standard protocol. Multiplanar CT image reconstructions were also generated. COMPARISON:  January 09, 2020 FINDINGS: Bones/Joint/Cartilage The patient is status post left total hip arthroplasty. No periprosthetic lucency fracture is identified. No large hip joint effusion is seen. Ligaments Suboptimally assessed by CT. Muscles and Tendons Somewhat limited due to the surrounding streak artifact. No focal atrophy is seen. The hamstrings tendons appear to be intact. Soft tissues There is diffuse subcutaneous edema and skin thickening seen along the anterolateral aspect of the hip. No definite loculated fluid collections are seen. Scattered small lymph nodes seen within the left inguinal region. A moderate amount of colonic stool is present. IMPRESSION: Status post left total hip arthroplasty with findings suggestive of surrounding edema which could be due to diffuse cellulitis surrounding the leg. No definite loculated fluid collections. Electronically Signed   By: Prudencio Pair M.D.   On: 09/04/2020 08:08     Assessment/Plan: Group B streptococcus bacteremia due to left thigh cellulitis.  Patient currently on penicillin.  She will need minimum 7 days of intravenous antibiotics until 09/10/2020.  Following which she would get another week of p.o. Keflex.  1:4 staph hemolyticus in blood culture- skin contaminant  Severe lymphedema/venous edema legs  Left thigh cellulitis improving.  CT scan did not show any abscess or joint involvement.  History of left gluteal and iliopsoas hematoma last year and earlier this year.  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  On suppressive doxy indefinitely after  initial IV antibiotics.   Discussed the management with the patient ID will follow her remotely tomorrow.  Call if needed.  [phone].

## 2020-09-05 ENCOUNTER — Encounter: Payer: Self-pay | Admitting: Internal Medicine

## 2020-09-05 DIAGNOSIS — A401 Sepsis due to streptococcus, group B: Principal | ICD-10-CM

## 2020-09-05 DIAGNOSIS — N189 Chronic kidney disease, unspecified: Secondary | ICD-10-CM

## 2020-09-05 DIAGNOSIS — N1832 Chronic kidney disease, stage 3b: Secondary | ICD-10-CM

## 2020-09-05 DIAGNOSIS — N179 Acute kidney failure, unspecified: Secondary | ICD-10-CM | POA: Diagnosis not present

## 2020-09-05 DIAGNOSIS — E669 Obesity, unspecified: Secondary | ICD-10-CM

## 2020-09-05 DIAGNOSIS — E785 Hyperlipidemia, unspecified: Secondary | ICD-10-CM

## 2020-09-05 DIAGNOSIS — I9589 Other hypotension: Secondary | ICD-10-CM

## 2020-09-05 DIAGNOSIS — I959 Hypotension, unspecified: Secondary | ICD-10-CM

## 2020-09-05 LAB — CBC WITH DIFFERENTIAL/PLATELET
Abs Immature Granulocytes: 0.08 10*3/uL — ABNORMAL HIGH (ref 0.00–0.07)
Basophils Absolute: 0 10*3/uL (ref 0.0–0.1)
Basophils Relative: 0 %
Eosinophils Absolute: 0.3 10*3/uL (ref 0.0–0.5)
Eosinophils Relative: 5 %
HCT: 36 % (ref 36.0–46.0)
Hemoglobin: 11.5 g/dL — ABNORMAL LOW (ref 12.0–15.0)
Immature Granulocytes: 1 %
Lymphocytes Relative: 14 %
Lymphs Abs: 0.9 10*3/uL (ref 0.7–4.0)
MCH: 30.4 pg (ref 26.0–34.0)
MCHC: 31.9 g/dL (ref 30.0–36.0)
MCV: 95.2 fL (ref 80.0–100.0)
Monocytes Absolute: 0.5 10*3/uL (ref 0.1–1.0)
Monocytes Relative: 9 %
Neutro Abs: 4.4 10*3/uL (ref 1.7–7.7)
Neutrophils Relative %: 71 %
Platelets: 120 10*3/uL — ABNORMAL LOW (ref 150–400)
RBC: 3.78 MIL/uL — ABNORMAL LOW (ref 3.87–5.11)
RDW: 17.1 % — ABNORMAL HIGH (ref 11.5–15.5)
WBC: 6.2 10*3/uL (ref 4.0–10.5)
nRBC: 0 % (ref 0.0–0.2)

## 2020-09-05 LAB — BASIC METABOLIC PANEL
Anion gap: 11 (ref 5–15)
BUN: 13 mg/dL (ref 8–23)
CO2: 24 mmol/L (ref 22–32)
Calcium: 8.8 mg/dL — ABNORMAL LOW (ref 8.9–10.3)
Chloride: 101 mmol/L (ref 98–111)
Creatinine, Ser: 1.13 mg/dL — ABNORMAL HIGH (ref 0.44–1.00)
GFR, Estimated: 51 mL/min — ABNORMAL LOW (ref >60)
Glucose, Bld: 94 mg/dL (ref 70–99)
Potassium: 4.1 mmol/L (ref 3.5–5.1)
Sodium: 136 mmol/L (ref 135–145)

## 2020-09-05 MED ORDER — VANCOMYCIN HCL 2000 MG/400ML IV SOLN
2000.0000 mg | Freq: Once | INTRAVENOUS | Status: AC
  Administered 2020-09-05: 2000 mg via INTRAVENOUS
  Filled 2020-09-05: qty 400

## 2020-09-05 MED ORDER — VANCOMYCIN HCL 1500 MG/300ML IV SOLN
1500.0000 mg | INTRAVENOUS | Status: DC
  Filled 2020-09-05: qty 300

## 2020-09-05 NOTE — NC FL2 (Signed)
[place] LEVEL OF CARE SCREENING TOOL     IDENTIFICATION  Patient Name: Becky Trevino Birthdate: December 09, 1945 Sex: female Admission Date (Current Location): 09/02/2020  [place] and [place] Number:  Engineering geologist and Address:  [facility], [address] [place], [place] [address]      Provider Number: [id number]  Attending Physician Name and Address:  Loletha Grayer, MD  Relative Name and Phone Number:  Teddie Helvey [phone]    Current Level of Care: Hospital Recommended Level of Care: [place] Prior Approval Number:    Date Approved/Denied:   PASRR Number: [id number]  Discharge Plan: SNF    Current Diagnoses: Patient Active Problem List   Diagnosis Date Noted  . Sepsis ([place]) 09/03/2020  . Sepsis due to undetermined organism ([place]) 09/02/2020  . Thrombocytopenia ([place])   . Mild protein-calorie malnutrition ([place])   . Hyperbilirubinemia   . Injury of left femoral nerve 08/28/2020  . History of DVT (deep vein thrombosis) 06/25/2020  . Lymphedema 06/25/2020  . History of pelvic hematoma 02/10/2020  . Foot ulcer, right, limited to breakdown of skin ([place]) 12/02/2019  . Bacteremia due to group B Streptococcus 10/30/2019  . Chronic venous hypertension due to deep vein thrombosis (DVT) 10/30/2019  . Hematoma   . Normocytic anemia 08/07/2019  . Chronic infection of right knee ([place]) 11/25/2018  . Infection and inflammatory reaction due to internal joint prosthesis, subsequent encounter 01/25/2018  . Infection of total right knee replacement ([place]) 11/19/2017  . Type 2 diabetes mellitus with diabetic neuropathy, without long-term current use of insulin ([place]) 11/06/2016  . Primary osteoarthritis of knee 03/11/2016  . Primary osteoarthritis of left hip 12/11/2015  . Benign essential HTN 04/21/2015  . Edema leg 04/21/2015  . Chronic kidney disease (CKD), stage III (moderate) ([place]) 04/21/2015  . Chronic  venous insufficiency 04/21/2015  . DDD (degenerative disc disease), cervical 04/21/2015  . Diabetes mellitus with renal manifestation ([place]) 04/21/2015  . Dyslipidemia 04/21/2015  . Bladder cystocele 04/21/2015  . Urinary incontinence 04/21/2015  . Leg varices 04/21/2015  . Insomnia 04/21/2015  . Eczema intertrigo 04/21/2015  . OSA (obstructive sleep apnea) 04/21/2015  . Menopausal and perimenopausal disorder 04/21/2015  . Supraventricular premature beats 04/21/2015  . Osteoarthritis of multiple joints 04/21/2015  . H/O Spinal surgery 11/14/2013  . Detrusor muscle hypertonia 11/07/2013    Orientation RESPIRATION BLADDER Height & Weight     Self,Time,Situation,Place  Normal Incontinent Weight: 127 kg Height:  5\' 11"  (180.3 cm)  BEHAVIORAL SYMPTOMS/MOOD NEUROLOGICAL BOWEL NUTRITION STATUS      Continent Diet (Carb Modified, Heart Healthy)  AMBULATORY STATUS COMMUNICATION OF NEEDS Skin   Extensive Assist Verbally Normal                       Personal Care Assistance Level of Assistance  Bathing,Feeding,Dressing Bathing Assistance: Maximum assistance Feeding assistance: Limited assistance Dressing Assistance: Maximum assistance     Functional Limitations Info             SPECIAL CARE FACTORS FREQUENCY  PT (By licensed PT),OT (By licensed OT)                    Contractures Contractures Info: Not present    Additional Factors Info  Code Status,Allergies Code Status Info: Full Allergies Info: Lisinopril           Current Medications (09/05/2020):  This is the current hospital active medication list Current Facility-Administered Medications  Medication Dose Route Frequency Provider Last Rate Last Admin  . acetaminophen (TYLENOL) tablet 650 mg  650 mg Oral Q6H PRN Reubin Milan, MD   650 mg at 09/03/20 1835   Or  . acetaminophen (TYLENOL) suppository 650 mg  650 mg Rectal Q6H PRN Reubin Milan, MD      . aspirin EC tablet 81 mg  81 mg Oral  Daily Kc, Ramesh, MD   81 mg at 09/05/20 0910  . atorvastatin (LIPITOR) tablet 40 mg  40 mg Oral QPM Kc, Maren [place], MD   40 mg at 09/04/20 1659  . benzonatate (TESSALON) capsule 200 mg  200 mg Oral BID PRN Lovey Newcomer T, NP   200 mg at 09/05/20 0910  . calcium-vitamin D (OSCAL WITH D) 500-200 MG-UNIT per tablet 1 tablet  1 tablet Oral BID Antonieta Pert, MD   1 tablet at 09/04/20 2033  . doxycycline (VIBRA-TABS) tablet 100 mg  100 mg Oral BID Kc, Ramesh, MD   100 mg at 09/05/20 0910  . enoxaparin (LOVENOX) injection 62.5 mg  0.5 mg/kg Subcutaneous Q24H Renda Rolls, RPH   62.5 mg at 09/04/20 2033  . fluticasone (FLONASE) 50 MCG/ACT nasal spray 2 spray  2 spray Each Nare Daily Antonieta Pert, MD   2 spray at 09/05/20 0912  . HYDROcodone-acetaminophen (NORCO/VICODIN) 5-325 MG per tablet 1 tablet  1 tablet Oral Q8H PRN Antonieta Pert, MD   1 tablet at 09/05/20 0910  . imipramine (TOFRANIL) tablet 25 mg  25 mg Oral QHS Antonieta Pert, MD   25 mg at 09/04/20 2034  . magnesium oxide (MAG-OX) tablet 400 mg  400 mg Oral Daily Kc, Ramesh, MD   400 mg at 09/04/20 1226  . midodrine (PROAMATINE) tablet 2.5 mg  2.5 mg Oral TID WC Kc, Ramesh, MD   2.5 mg at 09/05/20 0911  . mirabegron ER (MYRBETRIQ) tablet 50 mg  50 mg Oral Daily Kc, Ramesh, MD   50 mg at 09/05/20 0912  . ondansetron (ZOFRAN) tablet 4 mg  4 mg Oral Q6H PRN Reubin Milan, MD       Or  . ondansetron [facility]) injection 4 mg  4 mg Intravenous Q6H PRN Reubin Milan, MD      . penicillin G potassium 9 Million Units in dextrose 5 % 500 mL continuous infusion  9 Million Units Intravenous Q12H [name], MD 41.7 mL/hr at 09/05/20 0510 9 Million Units at 09/05/20 0510  . traMADol (ULTRAM) tablet 50 mg  50 mg Oral Q6H PRN Lovey Newcomer T, NP   50 mg at 09/03/20 2056     Discharge Medications: Please see discharge summary for a list of discharge medications.  Relevant Imaging Results:  Relevant Lab Results:   Additional Information ss  [id number]  Shelbie Ammons, RN

## 2020-09-05 NOTE — Progress Notes (Signed)
ID PROGRESS NOTE  Due to group b strep and staph aureus found on blood cx - we will change abtx to vancomycin and repeat blood cultures today. Awaiting sensitivities of the staph aureus to decide how to narrow thereafter. I have spoken to dr wieting for the plan  Caren Griffins B. [place] [facility] [phone]

## 2020-09-05 NOTE — Progress Notes (Signed)
Pharmacy Antibiotic Note  Becky Trevino is a 74 y.o. female admitted on 09/02/2020 with group B strep bacteremia secondary to cellulitis. Micro called this morning to inform the team that staph aureus had grown on the plate from the 23/76 BCs, it did not register on the BCID and since it has been >24 hours no BCID available. While we wait for sensitivities on the staph, we will stop the penicillin and start vancomycin. I have also stopped her suppressive doxy, as vancomycin will be enough. 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    Plan: Vancomycin 2000mg  IV x1 Vancomycin 1500mg  IV q24h Anticipated AUC of 525 Stop doxycycline  Stop penicillin  Follow up sensitivity of staph aureus   Height: 5\' 11"  (180.3 cm) Weight: 127 kg (280 lb) IBW/kg (Calculated) : 70.8  Temp (24hrs), Avg:98.2 F (36.8 C), Min:97.6 F (36.4 C), Max:98.8 F (37.1 C)  Recent Labs  Lab 09/01/20 1503 09/02/20 1542 09/02/20 1725 09/03/20 0415 09/04/20 0406 09/05/20 0416  WBC 11.9* 10.4  --  7.3 7.1 6.2  CREATININE 1.25* 1.52*  --  1.18* 1.19* 1.13*  LATICACIDVEN  --  2.9* 2.8* 1.0  --   --     Estimated Creatinine Clearance: 64.3 mL/min (A) (by C-G formula based on SCr of 1.13 mg/dL (H)).    Allergies  Allergen Reactions  . Lisinopril Swelling    Antimicrobials this admission: 12/19 azithromycin x1 12/19 ceftriaxone >>12/20 12/20 doxycycline >>12/22 12/20 penicillin G >> 12/22 12/22 vancomycin >>   Microbiology results: 12/19 BCx: GBS, staph aureus 12/19 UCx: proteus mirabilis   12/22 BCx: ordered  Thank you for allowing pharmacy to be a part of this patient's care.  Phillis Haggis 09/05/2020 2:57 PM

## 2020-09-05 NOTE — Progress Notes (Signed)
Patient ID: Becky Trevino, female   DOB: October 21, 1945, 74 y.o.   MRN: [id number] [facility] PROGRESS NOTE  Becky Trevino [id number] DOB: [birth year] DOA: 09/02/2020 PCP: [name], MD  HPI/Subjective: Patient does have a little pain in her left hip and left lower abdomen.  She describes it as a soreness.  The swelling is down a little bit in her left hip.  Feels pretty weak.  Objective: Vitals:   09/05/20 0744 09/05/20 1104  BP: 118/73 126/60  Pulse: 86 87  Resp: 18 18  Temp: 97.9 F (36.6 C) 98.2 F (36.8 C)  SpO2: 98% 97%    Intake/Output Summary (Last 24 hours) at 09/05/2020 1526 Last data filed at 09/05/2020 1512 Gross per 24 hour  Intake 774.22 ml  Output 950 ml  Net -175.78 ml   Filed Weights   09/02/20 1538  Weight: 127 kg    ROS: Review of Systems  Respiratory: Negative for shortness of breath.   Cardiovascular: Negative for chest pain.  Gastrointestinal: Negative for abdominal pain, nausea and vomiting.   Exam: Physical Exam HENT:     Head: Normocephalic.     Mouth/Throat:     Pharynx: No oropharyngeal exudate.  Eyes:     General: Lids are normal.     Conjunctiva/sclera: Conjunctivae normal.     Pupils: Pupils are equal, round, and reactive to light.  Cardiovascular:     Rate and Rhythm: Normal rate and regular rhythm.     Heart sounds: Normal heart sounds, S1 normal and S2 normal.  Pulmonary:     Breath sounds: Normal breath sounds. No decreased breath sounds, wheezing, rhonchi or rales.  Abdominal:     Palpations: Abdomen is soft.     Tenderness: There is no abdominal tenderness.  Musculoskeletal:     Left hip: Tenderness present.     Right lower leg: No swelling.     Left lower leg: No swelling.     Comments: Swelling of the left hip.  Skin:    General: Skin is warm.     Findings: No rash.     Comments: Slight redness left hip  Neurological:     Mental Status: She is alert and oriented to person, place, and time.        Data Reviewed: Basic Metabolic Panel: Recent Labs  Lab 09/01/20 1503 09/02/20 1542 09/03/20 0415 09/04/20 0406 09/05/20 0416  NA 139 136 136 136 136  K 3.9 3.9 3.6 3.6 4.1  CL 102 102 103 102 101  CO2 26 22 24 26 24   GLUCOSE 123* 101* 78 96 94  BUN 17 20 17 15 13   CREATININE 1.25* 1.52* 1.18* 1.19* 1.13*  CALCIUM 9.4 8.5* 8.3* 8.7* 8.8*   Liver Function Tests: Recent Labs  Lab 09/02/20 1542 09/03/20 0415 09/04/20 0406  AST 35 34 27  ALT 18 18 18   ALKPHOS 82 81 81  BILITOT 1.3* 1.4* 1.1  PROT 7.6 7.5 7.5  ALBUMIN 3.3* 3.1* 3.0*   CBC: Recent Labs  Lab 09/01/20 1503 09/02/20 1542 09/03/20 0415 09/04/20 0406 09/05/20 0416  WBC 11.9* 10.4 7.3 7.1 6.2  NEUTROABS  --  9.6* 6.5 5.7 4.4  HGB 13.3 11.0* 10.8* 10.7* 11.5*  HCT 41.4 33.9* 33.6* 32.8* 36.0  MCV 96.5 96.3 94.9 95.6 95.2  PLT 176 123* 111* 110* 120*   BNP (last 3 results) Recent Labs    09/02/20 1542  BNP 224.4*     Recent Results (from the past 240 hour(s))  Resp Panel by RT-PCR (Flu A&B, Covid) Nasopharyngeal Swab     Status: None   Collection Time: 09/02/20  4:44 PM   Specimen: Nasopharyngeal Swab; Nasopharyngeal(NP) swabs in vial transport medium  Result Value Ref Range Status   SARS Coronavirus 2 by RT PCR NEGATIVE NEGATIVE Final    Comment: (NOTE) SARS-CoV-2 target nucleic acids are NOT DETECTED.  The SARS-CoV-2 RNA is generally detectable in upper respiratory specimens during the acute phase of infection. The lowest concentration of SARS-CoV-2 viral copies this assay can detect is 138 copies/mL. A negative result does not preclude SARS-Cov-2 infection and should not be used as the sole basis for treatment or other patient management decisions. A negative result may occur with  improper specimen collection/handling, submission of specimen other than nasopharyngeal swab, presence of viral mutation(s) within the areas targeted by this assay, and inadequate number of  viral copies(<138 copies/mL). A negative result must be combined with clinical observations, patient history, and epidemiological information. The expected result is Negative.  Fact Sheet for Patients:  EntrepreneurPulse.com.au  Fact Sheet for Healthcare Providers:  IncredibleEmployment.be  This test is no t yet approved or cleared by the [place] FDA and  has been authorized for detection and/or diagnosis of SARS-CoV-2 by FDA under an Emergency Use Authorization (EUA). This EUA will remain  in effect (meaning this test can be used) for the duration of the COVID-19 declaration under Section 564(b)(1) of the Act, 21 U.S.C.section 360bbb-3(b)(1), unless the authorization is terminated  or revoked sooner.       Influenza A by PCR NEGATIVE NEGATIVE Final   Influenza B by PCR NEGATIVE NEGATIVE Final    Comment: (NOTE) The Xpert Xpress SARS-CoV-2/FLU/RSV plus assay is intended as an aid in the diagnosis of influenza from Nasopharyngeal swab specimens and should not be used as a sole basis for treatment. Nasal washings and aspirates are unacceptable for Xpert Xpress SARS-CoV-2/FLU/RSV testing.  Fact Sheet for Patients: EntrepreneurPulse.com.au  Fact Sheet for Healthcare Providers: IncredibleEmployment.be  This test is not yet approved or cleared by the [place] FDA and has been authorized for detection and/or diagnosis of SARS-CoV-2 by FDA under an Emergency Use Authorization (EUA). This EUA will remain in effect (meaning this test can be used) for the duration of the COVID-19 declaration under Section 564(b)(1) of the Act, 21 U.S.C. section 360bbb-3(b)(1), unless the authorization is terminated or revoked.  Performed at [facility], [address] [place], [place] [address]   Blood Culture ID Panel (Reflexed)     Status: Abnormal   Collection Time: 09/02/20  5:13 PM  Result Value Ref  Range Status   Enterococcus faecalis NOT DETECTED NOT DETECTED Final   Enterococcus Faecium NOT DETECTED NOT DETECTED Final   Listeria monocytogenes NOT DETECTED NOT DETECTED Final   Staphylococcus species DETECTED (A) NOT DETECTED Final    Comment: CRITICAL RESULT CALLED TO, READ BACK BY AND VERIFIED WITH:  AMY THOMPSON AT 1004 09/03/20 SDR    Staphylococcus aureus (BCID) NOT DETECTED NOT DETECTED Final   Staphylococcus epidermidis DETECTED (A) NOT DETECTED Final    Comment: Methicillin (oxacillin) resistant coagulase negative staphylococcus. Possible blood culture contaminant (unless isolated from more than one blood culture draw or clinical case suggests pathogenicity). No antibiotic treatment is indicated for blood  culture contaminants. CRITICAL RESULT CALLED TO, READ BACK BY AND VERIFIED WITH:  AMY THOMPSON AT 1004 09/03/20 SDR    Staphylococcus lugdunensis NOT DETECTED NOT DETECTED Final   Streptococcus species NOT DETECTED NOT  DETECTED Final   Streptococcus agalactiae NOT DETECTED NOT DETECTED Final   Streptococcus pneumoniae NOT DETECTED NOT DETECTED Final   Streptococcus pyogenes NOT DETECTED NOT DETECTED Final   A.calcoaceticus-baumannii NOT DETECTED NOT DETECTED Final   Bacteroides fragilis NOT DETECTED NOT DETECTED Final   Enterobacterales NOT DETECTED NOT DETECTED Final   Enterobacter cloacae complex NOT DETECTED NOT DETECTED Final   Escherichia coli NOT DETECTED NOT DETECTED Final   Klebsiella aerogenes NOT DETECTED NOT DETECTED Final   Klebsiella oxytoca NOT DETECTED NOT DETECTED Final   Klebsiella pneumoniae NOT DETECTED NOT DETECTED Final   Proteus species NOT DETECTED NOT DETECTED Final   Salmonella species NOT DETECTED NOT DETECTED Final   Serratia marcescens NOT DETECTED NOT DETECTED Final   Haemophilus influenzae NOT DETECTED NOT DETECTED Final   Neisseria meningitidis NOT DETECTED NOT DETECTED Final   Pseudomonas aeruginosa NOT DETECTED NOT DETECTED Final    Stenotrophomonas maltophilia NOT DETECTED NOT DETECTED Final   Candida albicans NOT DETECTED NOT DETECTED Final   Candida auris NOT DETECTED NOT DETECTED Final   Candida glabrata NOT DETECTED NOT DETECTED Final   Candida krusei NOT DETECTED NOT DETECTED Final   Candida parapsilosis NOT DETECTED NOT DETECTED Final   Candida tropicalis NOT DETECTED NOT DETECTED Final   Cryptococcus neoformans/gattii NOT DETECTED NOT DETECTED Final   Methicillin resistance mecA/C DETECTED (A) NOT DETECTED Final    Comment: CRITICAL RESULT CALLED TO, READ BACK BY AND VERIFIED WITH:  AMY THOMPSON AT 1004 09/03/20 SDR Performed at [facility], [address] [place], [place] [address]   Blood culture (routine x 2)     Status: Abnormal (Preliminary result)   Collection Time: 09/02/20  5:25 PM   Specimen: BLOOD  Result Value Ref Range Status   Specimen Description   Final    BLOOD BLOOD RIGHT HAND Performed at [facility], [address] [place], [place] [address]    Special Requests   Final    BOTTLES DRAWN AEROBIC AND ANAEROBIC Blood Culture results may not be optimal due to an inadequate volume of blood received in culture bottles Performed at [facility], [address] [place], [place] [address]    Culture  Setup Time   Final    Organism ID to follow [place].  VALUE IS CONSISTENT WITH PREVIOUSLY REPORTED AND CALLED VALUE. GRAM POSITIVE COCCI ANAEROBIC BOTTLE ONLY CRITICAL RESULT CALLED TO, READ BACK BY AND VERIFIED WITH: AMY THOMPSON AT 1004 09/03/20 SDR Performed at [facility], [address] [place], [place] [address]    Culture (A)  Final    GROUP B STREP(S.AGALACTIAE)ISOLATED SUSCEPTIBILITIES PERFORMED ON PREVIOUS CULTURE WITHIN THE LAST 5 DAYS. STAPHYLOCOCCUS HAEMOLYTICUS    Report Status PENDING  Incomplete  Blood culture (routine x 2)     Status: Abnormal (Preliminary result)    Collection Time: 09/02/20  5:25 PM   Specimen: BLOOD  Result Value Ref Range Status   Specimen Description   Final    BLOOD BLOOD LEFT HAND Performed at [facility], [address], [place], [place] [address]    Special Requests   Final    BOTTLES DRAWN AEROBIC AND ANAEROBIC Blood Culture results may not be optimal due to an inadequate volume of blood received in culture bottles Performed at [facility], [address], [place],  [address]    Culture  Setup Time   Final    Organism ID  to follow GRAM POSITIVE COCCI IN CHAINS IN BOTH AEROBIC AND ANAEROBIC BOTTLES CRITICAL RESULT CALLED TO, READ BACK BY AND VERIFIED WITH: JASON ROBBINS AT [id number] 09/03/20 [place] Performed at [facility], [address] [place], [place] [address]    Culture (A)  Final    GROUP B STREP(S.AGALACTIAE)ISOLATED STAPHYLOCOCCUS AUREUS SUSCEPTIBILITIES TO FOLLOW CRITICAL RESULT CALLED TO, READ BACK BY AND VERIFIED WITH: PHARMD THOMPSON, A. [id number] FCP Performed at [facility], [address], [place], [place] [address]    Report Status PENDING  Incomplete   Organism ID, Bacteria GROUP B STREP(S.AGALACTIAE)ISOLATED  Final      Susceptibility   Group b strep(s.agalactiae)isolated - MIC*    CLINDAMYCIN >=1 RESISTANT Resistant     AMPICILLIN <=0.25 SENSITIVE Sensitive     ERYTHROMYCIN >=8 RESISTANT Resistant     VANCOMYCIN 0.5 SENSITIVE Sensitive     CEFTRIAXONE <=0.12 SENSITIVE Sensitive     LEVOFLOXACIN 1 SENSITIVE Sensitive     * GROUP B STREP(S.AGALACTIAE)ISOLATED  Urine Culture     Status: Abnormal   Collection Time: 09/02/20  5:25 PM   Specimen: Urine, Random  Result Value Ref Range Status   Specimen Description   Final    URINE, RANDOM Performed at [facility], [address], [place], [place] [address]    Special Requests   Final    NONE Performed at [facility], [address] [place],  [address]    Culture 70,000  COLONIES/mL PROTEUS MIRABILIS (A)  Final   Report Status 09/04/2020 FINAL  Final   Organism ID, Bacteria PROTEUS MIRABILIS (A)  Final      Susceptibility   Proteus mirabilis - MIC*    AMPICILLIN <=2 SENSITIVE Sensitive     CEFAZOLIN 8 SENSITIVE Sensitive     CEFEPIME <=0.12 SENSITIVE Sensitive     CEFTRIAXONE <=0.25 SENSITIVE Sensitive     CIPROFLOXACIN <=0.25 SENSITIVE Sensitive     GENTAMICIN <=1 SENSITIVE Sensitive     IMIPENEM 1 SENSITIVE Sensitive     NITROFURANTOIN 128 RESISTANT Resistant     TRIMETH/SULFA <=20 SENSITIVE Sensitive     AMPICILLIN/SULBACTAM <=2 SENSITIVE Sensitive     PIP/TAZO <=4 SENSITIVE Sensitive     * 70,000 COLONIES/mL PROTEUS MIRABILIS  Blood Culture ID Panel (Reflexed)     Status: Abnormal   Collection Time: 09/02/20  5:25 PM  Result Value Ref Range Status   Enterococcus faecalis NOT DETECTED NOT DETECTED Final   Enterococcus Faecium NOT DETECTED NOT DETECTED Final   Listeria monocytogenes NOT DETECTED NOT DETECTED Final   Staphylococcus species NOT DETECTED NOT DETECTED Final   Staphylococcus aureus (BCID) NOT DETECTED NOT DETECTED Final   Staphylococcus epidermidis NOT DETECTED NOT DETECTED Final   Staphylococcus lugdunensis NOT DETECTED NOT DETECTED Final   Streptococcus species DETECTED (A) NOT DETECTED Final    Comment: CRITICAL RESULT CALLED TO, READ BACK BY AND VERIFIED WITH: JASON ROBBINS AT 0715 09/03/20 SDR    Streptococcus agalactiae DETECTED (A) NOT DETECTED Final    Comment: CRITICAL RESULT CALLED TO, READ BACK BY AND VERIFIED WITH:  JASON ROBBINS AT 0715 09/03/20 SDR    Streptococcus pneumoniae NOT DETECTED NOT DETECTED Final   Streptococcus pyogenes NOT DETECTED NOT DETECTED Final   A.calcoaceticus-baumannii NOT DETECTED NOT DETECTED Final   Bacteroides fragilis NOT DETECTED NOT DETECTED Final   Enterobacterales NOT DETECTED NOT DETECTED Final   Enterobacter cloacae complex NOT DETECTED NOT DETECTED Final   Escherichia coli NOT  DETECTED NOT  DETECTED Final   Klebsiella aerogenes NOT DETECTED NOT DETECTED Final   Klebsiella oxytoca NOT DETECTED NOT DETECTED Final   Klebsiella pneumoniae NOT DETECTED NOT DETECTED Final   Proteus species NOT DETECTED NOT DETECTED Final   Salmonella species NOT DETECTED NOT DETECTED Final   Serratia marcescens NOT DETECTED NOT DETECTED Final   Haemophilus influenzae NOT DETECTED NOT DETECTED Final   Neisseria meningitidis NOT DETECTED NOT DETECTED Final   Pseudomonas aeruginosa NOT DETECTED NOT DETECTED Final   Stenotrophomonas maltophilia NOT DETECTED NOT DETECTED Final   Candida albicans NOT DETECTED NOT DETECTED Final   Candida auris NOT DETECTED NOT DETECTED Final   Candida glabrata NOT DETECTED NOT DETECTED Final   Candida krusei NOT DETECTED NOT DETECTED Final   Candida parapsilosis NOT DETECTED NOT DETECTED Final   Candida tropicalis NOT DETECTED NOT DETECTED Final   Cryptococcus neoformans/gattii NOT DETECTED NOT DETECTED Final    Comment: Performed at [facility], [address] [place], [place] [address]     Studies: CT HIP LEFT WO CONTRAST  Result Date: 09/04/2020 CLINICAL DATA:  Septic arthritis EXAM: CT OF THE LEFT HIP WITHOUT CONTRAST TECHNIQUE: Multidetector CT imaging of the left hip was performed according to the standard protocol. Multiplanar CT image reconstructions were also generated. COMPARISON:  January 09, 2020 FINDINGS: Bones/Joint/Cartilage The patient is status post left total hip arthroplasty. No periprosthetic lucency fracture is identified. No large hip joint effusion is seen. Ligaments Suboptimally assessed by CT. Muscles and Tendons Somewhat limited due to the surrounding streak artifact. No focal atrophy is seen. The hamstrings tendons appear to be intact. Soft tissues There is diffuse subcutaneous edema and skin thickening seen along the anterolateral aspect of the hip. No definite loculated fluid collections are seen. Scattered small lymph  nodes seen within the left inguinal region. A moderate amount of colonic stool is present. IMPRESSION: Status post left total hip arthroplasty with findings suggestive of surrounding edema which could be due to diffuse cellulitis surrounding the leg. No definite loculated fluid collections. Electronically Signed   By: Prudencio Pair M.D.   On: 09/04/2020 08:08    Scheduled Meds: . aspirin EC  81 mg Oral Daily  . atorvastatin  40 mg Oral QPM  . calcium-vitamin D  1 tablet Oral BID  . enoxaparin (LOVENOX) injection  0.5 mg/kg Subcutaneous Q24H  . fluticasone  2 spray Each Nare Daily  . imipramine  25 mg Oral QHS  . magnesium oxide  400 mg Oral Daily  . midodrine  2.5 mg Oral TID WC  . mirabegron ER  50 mg Oral Daily   Continuous Infusions: . [START ON 09/06/2020] vancomycin    . vancomycin 2,000 mg (09/05/20 1512)    Assessment/Plan:  1. Sepsis present on admission with Streptococcus agalactiae and left thigh cellulitis.  Patient had leukocytosis, lactic acidosis, tachycardia.  Patient does have staph aureus and staph hemolyticus and staph epidermidis and cultures also.  Case discussed with infectious disease and they will change antibiotics over to vancomycin for right now.  Repeat blood cultures. 2. Acute kidney injury on chronic kidney disease stage IIIa.  During hospital course with creatinine going from 1.25 up to 1.52 and then down to 1.18 with hydration. 3. Chronic hypotension on home midodrine. 4. Hyperlipidemia.  On atorvastatin.   5. Last hemoglobin A1c low at 5.5.  No need for any diabetic medications.  Consider not a diabetic at this time. 6. Obesity.  BMI 39.05.  Code Status:     Code Status Orders  (From admission, onward)         Start     Ordered   09/02/20 2024  Full code  Continuous        09/02/20 2025        Code Status History    Date Active Date Inactive Code Status Order ID Comments User Context   10/28/2019 0506 11/07/2019 2326 Full Code  [id number]  Christel Mormon, MD Inpatient   08/07/2019 1604 08/09/2019 1856 Full Code [id number]  Para Skeans, MD ED   08/07/2019 1316 08/07/2019 1604 Full Code [id number]  Para Skeans, MD ED   11/19/2017 1141 11/22/2017 1429 Full Code [id number]  Hessie Knows, MD Inpatient   03/11/2016 1143 03/14/2016 2107 Full Code [id number]  Hessie Knows, MD Inpatient   Advance Care Planning Activity    Advance Directive Documentation   Flowsheet Row Most Recent Value  Type of Advance Directive Living will  Pre-existing out of facility DNR order (yellow form or pink MOST form) --  "MOST" Form in Place? --     Family Communication: Spoke with husband on the phone Disposition Plan: Status is: Inpatient  Dispo: The patient is from: Home              Anticipated d/c is to: Home versus rehab              Anticipated d/c date is: We will likely need another 3 days of IV antibiotics              Patient currently repeating blood cultures today and switching antibiotics.  We will have to have plan set up for IV antibiotics prior to disposition.  Consultants:  Infectious disease discharge.  Antibiotics:  Vancomycin  Time spent: 28 minutes  Joanna

## 2020-09-05 NOTE — Plan of Care (Signed)
  Problem: Clinical Measurements: Goal: Ability to maintain clinical measurements within normal limits will improve Outcome: Progressing Goal: Diagnostic test results will improve Outcome: Progressing Goal: Respiratory complications will improve Outcome: Progressing Goal: Cardiovascular complication will be avoided Outcome: Progressing   

## 2020-09-05 NOTE — Progress Notes (Addendum)
PHARMACY - PHYSICIAN COMMUNICATION CRITICAL VALUE ALERT - BLOOD CULTURE IDENTIFICATION (BCID)  Becky Trevino is an 74 y.o. female who presented to [facility] on 09/02/2020 with a chief complaint of Group B streptococcus bacteremia due to left thigh cellulitis.  Assessment:  Per microbiology, S. Aureus in anaerobic and aerobic bottles from the same set. However, since it has been >24 hours there is no BCID available. ID is also following this patient. GBS (S. Agalactiae sensitive to Vancomycin).  Name of physician (or Provider) Contacted: Dr. Baxter Flattery (ID MD on-call) and Dr. Leslye Peer.   Current antibiotics: PCN G 9 million units Q12H  Changes to prescribed antibiotics recommended: Penicillin G switched to Vancomycin until sensitivities are available. Followed up with ID MD, Dr. Baxter Flattery, with aforementioned recommendation and awaiting a response.    Results for orders placed or performed during the hospital encounter of 09/02/20  Blood Culture ID Panel (Reflexed) (Collected: 09/02/2020  5:13 PM)  Result Value Ref Range   Enterococcus faecalis NOT DETECTED NOT DETECTED   Enterococcus Faecium NOT DETECTED NOT DETECTED   Listeria monocytogenes NOT DETECTED NOT DETECTED   Staphylococcus species DETECTED (A) NOT DETECTED   Staphylococcus aureus (BCID) NOT DETECTED NOT DETECTED   Staphylococcus epidermidis DETECTED (A) NOT DETECTED   Staphylococcus lugdunensis NOT DETECTED NOT DETECTED   Streptococcus species NOT DETECTED NOT DETECTED   Streptococcus agalactiae NOT DETECTED NOT DETECTED   Streptococcus pneumoniae NOT DETECTED NOT DETECTED   Streptococcus pyogenes NOT DETECTED NOT DETECTED   A.calcoaceticus-baumannii NOT DETECTED NOT DETECTED   Bacteroides fragilis NOT DETECTED NOT DETECTED   Enterobacterales NOT DETECTED NOT DETECTED   Enterobacter cloacae complex NOT DETECTED NOT DETECTED   Escherichia coli NOT DETECTED NOT DETECTED   Klebsiella aerogenes NOT DETECTED NOT DETECTED    Klebsiella oxytoca NOT DETECTED NOT DETECTED   Klebsiella pneumoniae NOT DETECTED NOT DETECTED   Proteus species NOT DETECTED NOT DETECTED   Salmonella species NOT DETECTED NOT DETECTED   Serratia marcescens NOT DETECTED NOT DETECTED   Haemophilus influenzae NOT DETECTED NOT DETECTED   Neisseria meningitidis NOT DETECTED NOT DETECTED   Pseudomonas aeruginosa NOT DETECTED NOT DETECTED   Stenotrophomonas maltophilia NOT DETECTED NOT DETECTED   Candida albicans NOT DETECTED NOT DETECTED   Candida auris NOT DETECTED NOT DETECTED   Candida glabrata NOT DETECTED NOT DETECTED   Candida krusei NOT DETECTED NOT DETECTED   Candida parapsilosis NOT DETECTED NOT DETECTED   Candida tropicalis NOT DETECTED NOT DETECTED   Cryptococcus neoformans/gattii NOT DETECTED NOT DETECTED   Methicillin resistance mecA/C DETECTED (A) NOT DETECTED    Rowland Lathe 09/05/2020  12:51 PM

## 2020-09-05 NOTE — TOC Initial Note (Signed)
Transition of Care [facility]) - Initial/Assessment Note    Patient Details  Name: Becky Trevino MRN: [id number] Date of Birth: 10-28-1945  Transition of Care [facility]) CM/SW Contact:    Shelbie Ammons, RN Phone Number: 09/05/2020, 10:03 AM  Clinical Narrative:    RNCM met with patient at bedside, she is alert and verbally responsive and reports to feeling ok today. Patient reports that she lives at home with her husband and that her daughter comes daily, she also has a hired CG that provides some assistance. Discussed current recommendations for SNF, patient does not immediately refuse but reports that this not something she really wants to do. Patient is agreeable to a bed search but reports she isn't sure she will accept a placement. Patient is agreeable only to bed offers being sent to [facility] and [facility]. RNCM will verify PASSR, complete FL2 and send out bed request.                 Expected Discharge Plan: [place] Barriers to Discharge: No Barriers Identified   Patient Goals and CMS Choice        Expected Discharge Plan and Services Expected Discharge Plan: [place]       Living arrangements for the past 2 months: Single Family Home                                      Prior Living Arrangements/Services Living arrangements for the past 2 months: Single Family Home Lives with:: Spouse Patient language and need for interpreter reviewed:: Yes Do you feel safe going back to the place where you live?: Yes      Need for Family Participation in Patient Care: Yes (Comment) Care giver support system in place?: Yes (comment)   Criminal Activity/Legal Involvement Pertinent to Current Situation/Hospitalization: No - Comment as needed  Activities of Daily Living Home Assistive Devices/Equipment: Walker (specify type),CBG Meter ADL Screening (condition at time of admission) Patient's cognitive ability adequate to safely  complete daily activities?: Yes Is the patient deaf or have difficulty hearing?: No Does the patient have difficulty seeing, even when wearing glasses/contacts?: No Does the patient have difficulty concentrating, remembering, or making decisions?: No Patient able to express need for assistance with ADLs?: No Does the patient have difficulty dressing or bathing?: Yes Independently performs ADLs?: Yes (appropriate for developmental age) Does the patient have difficulty walking or climbing stairs?: Yes Weakness of Legs: Both Weakness of Arms/Hands: None  Permission Sought/Granted                  Emotional Assessment Appearance:: Appears stated age   Affect (typically observed): Appropriate Orientation: : Oriented to Self,Oriented to Place,Oriented to  Time,Oriented to Situation Alcohol / Substance Use: Not Applicable Psych Involvement: No (comment)  Admission diagnosis:  Lower urinary tract infectious disease [N39.0] Weakness [R53.1] Sepsis ([place]) [A41.9] Sepsis due to undetermined organism ([place]) [A41.9] Community acquired pneumonia, unspecified laterality [J18.9] Patient Active Problem List   Diagnosis Date Noted  . Sepsis ([place]) 09/03/2020  . Sepsis due to undetermined organism ([place]) 09/02/2020  . Thrombocytopenia ([place])   . Mild protein-calorie malnutrition ([place])   . Hyperbilirubinemia   . Injury of left femoral nerve 08/28/2020  . History of DVT (deep vein thrombosis) 06/25/2020  . Lymphedema 06/25/2020  . History of pelvic hematoma 02/10/2020  . Foot ulcer, right,  limited to breakdown of skin ([place]) 12/02/2019  . Bacteremia due to group B Streptococcus 10/30/2019  . Chronic venous hypertension due to deep vein thrombosis (DVT) 10/30/2019  . Hematoma   . Normocytic anemia 08/07/2019  . Chronic infection of right knee ([place]) 11/25/2018  . Infection and inflammatory reaction due to internal joint prosthesis, subsequent encounter 01/25/2018  . Infection of total right knee  replacement ([place]) 11/19/2017  . Type 2 diabetes mellitus with diabetic neuropathy, without long-term current use of insulin ([place]) 11/06/2016  . Primary osteoarthritis of knee 03/11/2016  . Primary osteoarthritis of left hip 12/11/2015  . Benign essential HTN 04/21/2015  . Edema leg 04/21/2015  . Chronic kidney disease (CKD), stage III (moderate) ([place]) 04/21/2015  . Chronic venous insufficiency 04/21/2015  . DDD (degenerative disc disease), cervical 04/21/2015  . Diabetes mellitus with renal manifestation ([place]) 04/21/2015  . Dyslipidemia 04/21/2015  . Bladder cystocele 04/21/2015  . Urinary incontinence 04/21/2015  . Leg varices 04/21/2015  . Insomnia 04/21/2015  . Eczema intertrigo 04/21/2015  . OSA (obstructive sleep apnea) 04/21/2015  . Menopausal and perimenopausal disorder 04/21/2015  . Supraventricular premature beats 04/21/2015  . Osteoarthritis of multiple joints 04/21/2015  . H/O Spinal surgery 11/14/2013  . Detrusor muscle hypertonia 11/07/2013   PCP:  [name], MD Pharmacy:   [id number] [place], [place] [id number]: [phone]: [phone]    Social Determinants of Health (SDOH) Interventions    Readmission Risk Interventions No flowsheet data found.

## 2020-09-06 ENCOUNTER — Other Ambulatory Visit: Payer: Self-pay

## 2020-09-06 DIAGNOSIS — Z6839 Body mass index (BMI) 39.0-39.9, adult: Secondary | ICD-10-CM

## 2020-09-06 DIAGNOSIS — R000 Tachycardia, unspecified: Secondary | ICD-10-CM

## 2020-09-06 DIAGNOSIS — G5722 Lesion of femoral nerve, left lower limb: Secondary | ICD-10-CM

## 2020-09-06 DIAGNOSIS — B9562 Methicillin resistant Staphylococcus aureus infection as the cause of diseases classified elsewhere: Secondary | ICD-10-CM

## 2020-09-06 DIAGNOSIS — I471 Supraventricular tachycardia: Secondary | ICD-10-CM

## 2020-09-06 DIAGNOSIS — N179 Acute kidney failure, unspecified: Secondary | ICD-10-CM | POA: Diagnosis not present

## 2020-09-06 DIAGNOSIS — I9589 Other hypotension: Secondary | ICD-10-CM | POA: Diagnosis not present

## 2020-09-06 DIAGNOSIS — R1030 Lower abdominal pain, unspecified: Secondary | ICD-10-CM

## 2020-09-06 DIAGNOSIS — A401 Sepsis due to streptococcus, group B: Secondary | ICD-10-CM | POA: Diagnosis not present

## 2020-09-06 DIAGNOSIS — Z89611 Acquired absence of right leg above knee: Secondary | ICD-10-CM

## 2020-09-06 DIAGNOSIS — I890 Lymphedema, not elsewhere classified: Secondary | ICD-10-CM

## 2020-09-06 LAB — CBC WITH DIFFERENTIAL/PLATELET
Abs Immature Granulocytes: 0.15 10*3/uL — ABNORMAL HIGH (ref 0.00–0.07)
Basophils Absolute: 0 10*3/uL (ref 0.0–0.1)
Basophils Relative: 1 %
Eosinophils Absolute: 0.3 10*3/uL (ref 0.0–0.5)
Eosinophils Relative: 4 %
HCT: 35.8 % — ABNORMAL LOW (ref 36.0–46.0)
Hemoglobin: 11.8 g/dL — ABNORMAL LOW (ref 12.0–15.0)
Immature Granulocytes: 2 %
Lymphocytes Relative: 13 %
Lymphs Abs: 0.9 10*3/uL (ref 0.7–4.0)
MCH: 31.1 pg (ref 26.0–34.0)
MCHC: 33 g/dL (ref 30.0–36.0)
MCV: 94.5 fL (ref 80.0–100.0)
Monocytes Absolute: 0.6 10*3/uL (ref 0.1–1.0)
Monocytes Relative: 9 %
Neutro Abs: 4.6 10*3/uL (ref 1.7–7.7)
Neutrophils Relative %: 71 %
Platelets: 140 10*3/uL — ABNORMAL LOW (ref 150–400)
RBC: 3.79 MIL/uL — ABNORMAL LOW (ref 3.87–5.11)
RDW: 17.3 % — ABNORMAL HIGH (ref 11.5–15.5)
WBC: 6.4 10*3/uL (ref 4.0–10.5)
nRBC: 0 % (ref 0.0–0.2)

## 2020-09-06 LAB — BASIC METABOLIC PANEL
Anion gap: 11 (ref 5–15)
BUN: 14 mg/dL (ref 8–23)
CO2: 24 mmol/L (ref 22–32)
Calcium: 8.9 mg/dL (ref 8.9–10.3)
Chloride: 102 mmol/L (ref 98–111)
Creatinine, Ser: 0.98 mg/dL (ref 0.44–1.00)
GFR, Estimated: >60 mL/min (ref >60)
Glucose, Bld: 87 mg/dL (ref 70–99)
Potassium: 3.9 mmol/L (ref 3.5–5.1)
Sodium: 137 mmol/L (ref 135–145)

## 2020-09-06 LAB — MAGNESIUM: Magnesium: 2.4 mg/dL (ref 1.7–2.4)

## 2020-09-06 LAB — CULTURE, BLOOD (ROUTINE X 2)

## 2020-09-06 LAB — MRSA PCR SCREENING: MRSA by PCR: POSITIVE — AB

## 2020-09-06 LAB — TSH: TSH: 9.079 u[IU]/mL — ABNORMAL HIGH (ref 0.350–4.500)

## 2020-09-06 MED ORDER — MUPIROCIN 2 % EX OINT
1.0000 "application " | TOPICAL_OINTMENT | Freq: Two times a day (BID) | CUTANEOUS | Status: AC
  Administered 2020-09-06 – 2020-09-10 (×10): 1 via NASAL
  Filled 2020-09-06: qty 22

## 2020-09-06 MED ORDER — LEVOTHYROXINE SODIUM 25 MCG PO TABS
25.0000 ug | ORAL_TABLET | Freq: Every day | ORAL | Status: DC
  Administered 2020-09-07 – 2020-09-14 (×8): 25 ug via ORAL
  Filled 2020-09-06 (×8): qty 1

## 2020-09-06 MED ORDER — CHLORHEXIDINE GLUCONATE CLOTH 2 % EX PADS
6.0000 | MEDICATED_PAD | Freq: Every day | CUTANEOUS | Status: AC
  Administered 2020-09-07 – 2020-09-11 (×5): 6 via TOPICAL

## 2020-09-06 MED ORDER — METOPROLOL SUCCINATE ER 25 MG PO TB24
25.0000 mg | ORAL_TABLET | Freq: Every day | ORAL | Status: DC
  Administered 2020-09-06 – 2020-09-13 (×8): 25 mg via ORAL
  Filled 2020-09-06 (×9): qty 1

## 2020-09-06 MED ORDER — METOPROLOL TARTRATE 5 MG/5ML IV SOLN
5.0000 mg | Freq: Once | INTRAVENOUS | Status: DC
  Filled 2020-09-06: qty 5

## 2020-09-06 MED ORDER — ADENOSINE 6 MG/2ML IV SOLN
6.0000 mg | Freq: Once | INTRAVENOUS | Status: AC
  Administered 2020-09-06: 6 mg via INTRAVENOUS
  Filled 2020-09-06: qty 2

## 2020-09-06 MED ORDER — TRAMADOL HCL 50 MG PO TABS
50.0000 mg | ORAL_TABLET | Freq: Four times a day (QID) | ORAL | Status: AC | PRN
Start: 2020-09-06 — End: 2020-09-10
  Administered 2020-09-10: 50 mg via ORAL
  Filled 2020-09-06: qty 1

## 2020-09-06 MED ORDER — VANCOMYCIN HCL 1750 MG/350ML IV SOLN
1750.0000 mg | INTRAVENOUS | Status: DC
  Administered 2020-09-06: 1750 mg via INTRAVENOUS
  Filled 2020-09-06 (×2): qty 350

## 2020-09-06 MED ORDER — ADENOSINE 6 MG/2ML IV SOLN
12.0000 mg | Freq: Once | INTRAVENOUS | Status: AC
  Administered 2020-09-06: 12 mg via INTRAVENOUS
  Filled 2020-09-06: qty 4

## 2020-09-06 NOTE — Progress Notes (Signed)
ID Patient says she has pain in the lower abdomen. Left thigh pain is better. No fever Patient Vitals for the past 24 hrs:  BP Temp Temp src Pulse Resp SpO2  09/06/20 1927 128/75 98.4 F (36.9 C) -- 84 18 97 %  09/06/20 1536 (!) 116/57 98 F (36.7 C) -- 85 18 93 %  09/06/20 1353 128/64 98.2 F (36.8 C) -- 90 14 96 %  09/06/20 0951 117/65 98.6 F (37 C) Oral (!) 102 16 96 %  09/06/20 0546 110/61 98.8 F (37.1 C) -- (!) 102 18 98 %  09/06/20 0413 107/61 99.1 F (37.3 C) Oral (!) 106 18 97 %  09/06/20 0227 (!) 147/111 98.6 F (37 C) Oral (!) 161 18 96 %  09/06/20 0145 123/77 98.4 F (36.9 C) -- (!) 153 16 99 %  09/05/20 2344 134/65 98.9 F (37.2 C) -- 97 17 94 %  Awake and alert Chest bilateral air entry Heart sounds S1-S2 Severe lymphedema of the legs Left upper thigh induration with dry scaly skin Erythema better Right knee scar  Labs  CBC Latest Ref Rng & Units 09/06/2020 09/05/2020 09/04/2020  WBC 4.0 - 10.5 K/uL 6.4 6.2 7.1  Hemoglobin 12.0 - 15.0 g/dL 11.8(L) 11.5(L) 10.7(L)  Hematocrit 36.0 - 46.0 % 35.8(L) 36.0 32.8(L)  Platelets 150 - 400 K/uL 140(L) 120(L) 110(L)    CMP Latest Ref Rng & Units 09/06/2020 09/05/2020 09/04/2020  Glucose 70 - 99 mg/dL 87 94 96  BUN 8 - 23 mg/dL 14 13 15   Creatinine 0.44 - 1.00 mg/dL 0.98 1.13(H) 1.19(H)  Sodium 135 - 145 mmol/L 137 136 136  Potassium 3.5 - 5.1 mmol/L 3.9 4.1 3.6  Chloride 98 - 111 mmol/L 102 101 102  CO2 22 - 32 mmol/L 24 24 26   Calcium 8.9 - 10.3 mg/dL 8.9 8.8(L) 8.7(L)  Total Protein 6.5 - 8.1 g/dL - - 7.5  Total Bilirubin 0.3 - 1.2 mg/dL - - 1.1  Alkaline Phos 38 - 126 U/L - - 81  AST 15 - 41 U/L - - 27  ALT 0 - 44 U/L - - 18    Micro 12/19 BC GBS/MRSA  12/22 BC repeated  Assessment and plan.  Group B streptococcus bacteremia due to left upper thigh cellulitis.  Was on penicillin.  Because of MRSA all in one of the 4 bottles penicillin has been changed to vancomycin. Unclear source for MRSA.  The  bioburden of MRSA is minimal.  1 out of 4 bottles.  There is no purulence noted in the cellulitic area. Also has staph hemolyticus one and 4 bottles which is considered to be a contaminant.  Echo and repeat blood cultures pending We will give vancomycin for 2-3weeks.  Until 09/24/2020. While on IV vancomycin will need monitoring of Vanco trough and creatinine.  Lower abdominal pain May need CT abdomen if pain persists  Severe lymphedema both legs will need compressive therapy.  Left femoral neuropathy  Right popliteal DVT status post filter  Severe obesity  Right TKA, prosthetic joint infection with coag negative staph in 2017 was on suppressive doxy therapy after initial IV antibiotics  Discussed the management with the patient.  ID will follow her peripherally this weekend.  Call if needed.  [phone].

## 2020-09-06 NOTE — Plan of Care (Signed)
  Problem: Education: Goal: Knowledge of General Education information will improve Description: Including pain rating scale, medication(s)/side effects and non-pharmacologic comfort measures 09/06/2020 1054 by Cristela Blue, RN Outcome: Progressing 09/06/2020 1054 by Cristela Blue, RN Outcome: Progressing   Problem: Health Behavior/Discharge Planning: Goal: Ability to manage health-related needs will improve 09/06/2020 1054 by Cristela Blue, RN Outcome: Progressing 09/06/2020 1054 by Cristela Blue, RN Outcome: Progressing   Problem: Clinical Measurements: Goal: Ability to maintain clinical measurements within normal limits will improve 09/06/2020 1054 by Cristela Blue, RN Outcome: Progressing 09/06/2020 1054 by Cristela Blue, RN Outcome: Progressing Goal: Will remain free from infection 09/06/2020 1054 by Cristela Blue, RN Outcome: Progressing 09/06/2020 1054 by Cristela Blue, RN Outcome: Progressing Goal: Diagnostic test results will improve 09/06/2020 1054 by Cristela Blue, RN Outcome: Progressing 09/06/2020 1054 by Cristela Blue, RN Outcome: Progressing Goal: Respiratory complications will improve 09/06/2020 1054 by Cristela Blue, RN Outcome: Progressing 09/06/2020 1054 by Cristela Blue, RN Outcome: Progressing Goal: Cardiovascular complication will be avoided 09/06/2020 1054 by Cristela Blue, RN Outcome: Progressing 09/06/2020 1054 by Cristela Blue, RN Outcome: Progressing   Problem: Activity: Goal: Risk for activity intolerance will decrease 09/06/2020 1054 by Cristela Blue, RN Outcome: Progressing 09/06/2020 1054 by Cristela Blue, RN Outcome: Progressing   Problem: Nutrition: Goal: Adequate nutrition will be maintained 09/06/2020 1054 by Cristela Blue, RN Outcome: Progressing 09/06/2020 1054 by Cristela Blue, RN Outcome: Progressing   Problem: Coping: Goal: Level of anxiety will decrease 09/06/2020 1054 by Cristela Blue, RN Outcome:  Progressing 09/06/2020 1054 by Cristela Blue, RN Outcome: Progressing   Problem: Elimination: Goal: Will not experience complications related to bowel motility 09/06/2020 1054 by Cristela Blue, RN Outcome: Progressing 09/06/2020 1054 by Cristela Blue, RN Outcome: Progressing Goal: Will not experience complications related to urinary retention 09/06/2020 1054 by Cristela Blue, RN Outcome: Progressing 09/06/2020 1054 by Cristela Blue, RN Outcome: Progressing   Problem: Pain Managment: Goal: General experience of comfort will improve 09/06/2020 1054 by Cristela Blue, RN Outcome: Progressing 09/06/2020 1054 by Cristela Blue, RN Outcome: Progressing   Problem: Safety: Goal: Ability to remain free from injury will improve 09/06/2020 1054 by Cristela Blue, RN Outcome: Progressing 09/06/2020 1054 by Cristela Blue, RN Outcome: Progressing   Problem: Skin Integrity: Goal: Risk for impaired skin integrity will decrease 09/06/2020 1054 by Cristela Blue, RN Outcome: Progressing 09/06/2020 1054 by Cristela Blue, RN Outcome: Progressing

## 2020-09-06 NOTE — Progress Notes (Signed)
Patient noted with HR between 160-180 bmp sustaining per RN. On bedside assessment, she was afebrile with blood pressure 107/61 mm Hg and pulse rate 165 beats/min. There were no focal neurological deficits; she was alert and oriented x4. Patient was c/o palpitations without associated symptoms of n/v, chest pain, dizziness, or diaphoresis.   STAT EKG obtained and showed SVT rate 168bmp. Attempted vagal manuevers without success. Adenosine 6mg  administered rapid IV push with brief response for a few minutes but then went back into SVT. Second dose of Adenosine 12 mg administered rapid IV push with 20 cc rapid saline flush with immediate success noted. Now in NSR and occasional sinus tachy 101-102 bmp. Will check TSH as well.    Rufina Falco, BSN, MSN, DNP, [employer]  [facility] Nurse Practitioner  [facility]

## 2020-09-06 NOTE — Progress Notes (Signed)
Pharmacy Antibiotic Note  Becky Trevino is a 74 y.o. female admitted on 09/02/2020 with group B strep bacteremia secondary to cellulitis. Micro called this morning to inform the team that staph aureus had grown on the plate from the 70/17 BCs, it did not register on the BCID and since it has been >24 hours no BCID available. While we wait for sensitivities on the staph, we will stop the penicillin and start vancomycin. I have also stopped her suppressive doxy, as vancomycin will be enough. 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    Plan: Adjust Vancomycin from 1500mg  IV q24h to 1750 mg Q24h  Anticipated AUC of 539.1 (expected Css min 12.7) Will check renal function with AM labs.    Height: 5\' 11"  (180.3 cm) Weight: 127 kg (280 lb) IBW/kg (Calculated) : 70.8  Temp (24hrs), Avg:98.4 F (36.9 C), Min:97.7 F (36.5 C), Max:99.1 F (37.3 C)  Recent Labs  Lab 09/02/20 1542 09/02/20 1725 09/03/20 0415 09/04/20 0406 09/05/20 0416 09/06/20 0416 09/06/20 0417  WBC 10.4  --  7.3 7.1 6.2 6.4  --   CREATININE 1.52*  --  1.18* 1.19* 1.13*  --  0.98  LATICACIDVEN 2.9* 2.8* 1.0  --   --   --   --     Estimated Creatinine Clearance: 74.2 mL/min (by C-G formula based on SCr of 0.98 mg/dL).    Allergies  Allergen Reactions  . Lisinopril Swelling    Antimicrobials this admission: 12/19 azithromycin x1 12/19 ceftriaxone >>12/20 12/20 doxycycline >>12/22 12/20 penicillin G >> 12/22 12/22 vancomycin >>   Microbiology results: 12/19 BCx: GBS, MRSA; vanc sensitive  12/19 UCx: proteus mirabilis   12/22 BCx: NGTD 12/23 MRSA PCR: (+)  Thank you for allowing pharmacy to be a part of this patient's care.  Rowland Lathe 09/06/2020 3:38 PM

## 2020-09-06 NOTE — Progress Notes (Signed)
Patient ID: Becky Trevino, female   DOB: Mar 17, 1946, 74 y.o.   MRN: [id number] [facility] PROGRESS NOTE  Becky Trevino [id number] DOB: [birth year] DOA: 09/02/2020 PCP: [name], MD  HPI/Subjective: Patient feels okay.  Offers no complaints.  Last night had fast heart rate requiring 2 doses of adenosine for SVT.  No chest pain or shortness of breath.  Admitted with sepsis.  Objective: Vitals:   09/06/20 0951 09/06/20 1353  BP: 117/65 128/64  Pulse: (!) 102 90  Resp: 16 14  Temp: 98.6 F (37 C) 98.2 F (36.8 C)  SpO2: 96% 96%    Intake/Output Summary (Last 24 hours) at 09/06/2020 1452 Last data filed at 09/06/2020 1044 Gross per 24 hour  Intake 526.8 ml  Output 650 ml  Net -123.2 ml   Filed Weights   09/02/20 1538  Weight: 127 kg    ROS: Review of Systems  Respiratory: Negative for cough and shortness of breath.   Cardiovascular: Negative for chest pain.  Gastrointestinal: Negative for abdominal pain, nausea and vomiting.  Musculoskeletal: Negative for joint pain.   Exam: Physical Exam HENT:     Head: Normocephalic.     Mouth/Throat:     Pharynx: No oropharyngeal exudate.  Eyes:     General: Lids are normal.     Conjunctiva/sclera: Conjunctivae normal.     Pupils: Pupils are equal, round, and reactive to light.  Cardiovascular:     Rate and Rhythm: Normal rate and regular rhythm.     Heart sounds: Normal heart sounds, S1 normal and S2 normal.  Pulmonary:     Breath sounds: Normal breath sounds. No decreased breath sounds, wheezing, rhonchi or rales.  Abdominal:     Palpations: Abdomen is soft.     Tenderness: There is no abdominal tenderness.  Musculoskeletal:     Right lower leg: Swelling present.     Left lower leg: Swelling present.  Skin:    General: Skin is warm.     Comments: Slight warmth around left hip.  No pain to palpation.  Skin dry but intact laterally and anteriorly.  Neurological:     Mental Status: She is alert and  oriented to person, place, and time.       Data Reviewed: Basic Metabolic Panel: Recent Labs  Lab 09/02/20 1542 09/03/20 0415 09/04/20 0406 09/05/20 0416 09/06/20 0417  NA 136 136 136 136 137  K 3.9 3.6 3.6 4.1 3.9  CL 102 103 102 101 102  CO2 22 24 26 24 24   GLUCOSE 101* 78 96 94 87  BUN 20 17 15 13 14   CREATININE 1.52* 1.18* 1.19* 1.13* 0.98  CALCIUM 8.5* 8.3* 8.7* 8.8* 8.9  MG  --   --   --   --  2.4   Liver Function Tests: Recent Labs  Lab 09/02/20 1542 09/03/20 0415 09/04/20 0406  AST 35 34 27  ALT 18 18 18   ALKPHOS 82 81 81  BILITOT 1.3* 1.4* 1.1  PROT 7.6 7.5 7.5  ALBUMIN 3.3* 3.1* 3.0*   CBC: Recent Labs  Lab 09/02/20 1542 09/03/20 0415 09/04/20 0406 09/05/20 0416 09/06/20 0416  WBC 10.4 7.3 7.1 6.2 6.4  NEUTROABS 9.6* 6.5 5.7 4.4 4.6  HGB 11.0* 10.8* 10.7* 11.5* 11.8*  HCT 33.9* 33.6* 32.8* 36.0 35.8*  MCV 96.3 94.9 95.6 95.2 94.5  PLT 123* 111* 110* 120* 140*   BNP (last 3 results) Recent Labs    09/02/20 1542  BNP 224.4*  Recent Results (from the past 240 hour(s))  Resp Panel by RT-PCR (Flu A&B, Covid) Nasopharyngeal Swab     Status: None   Collection Time: 09/02/20  4:44 PM   Specimen: Nasopharyngeal Swab; Nasopharyngeal(NP) swabs in vial transport medium  Result Value Ref Range Status   SARS Coronavirus 2 by RT PCR NEGATIVE NEGATIVE Final    Comment: (NOTE) SARS-CoV-2 target nucleic acids are NOT DETECTED.  The SARS-CoV-2 RNA is generally detectable in upper respiratory specimens during the acute phase of infection. The lowest concentration of SARS-CoV-2 viral copies this assay can detect is 138 copies/mL. A negative result does not preclude SARS-Cov-2 infection and should not be used as the sole basis for treatment or other patient management decisions. A negative result may occur with  improper specimen collection/handling, submission of specimen other than nasopharyngeal swab, presence of viral mutation(s) within  the areas targeted by this assay, and inadequate number of viral copies(<138 copies/mL). A negative result must be combined with clinical observations, patient history, and epidemiological information. The expected result is Negative.  Fact Sheet for Patients:  EntrepreneurPulse.com.au  Fact Sheet for Healthcare Providers:  IncredibleEmployment.be  This test is no t yet approved or cleared by the [place] FDA and  has been authorized for detection and/or diagnosis of SARS-CoV-2 by FDA under an Emergency Use Authorization (EUA). This EUA will remain  in effect (meaning this test can be used) for the duration of the COVID-19 declaration under Section 564(b)(1) of the Act, 21 U.S.C.section 360bbb-3(b)(1), unless the authorization is terminated  or revoked sooner.       Influenza A by PCR NEGATIVE NEGATIVE Final   Influenza B by PCR NEGATIVE NEGATIVE Final    Comment: (NOTE) The Xpert Xpress SARS-CoV-2/FLU/RSV plus assay is intended as an aid in the diagnosis of influenza from Nasopharyngeal swab specimens and should not be used as a sole basis for treatment. Nasal washings and aspirates are unacceptable for Xpert Xpress SARS-CoV-2/FLU/RSV testing.  Fact Sheet for Patients: EntrepreneurPulse.com.au  Fact Sheet for Healthcare Providers: IncredibleEmployment.be  This test is not yet approved or cleared by the [place] FDA and has been authorized for detection and/or diagnosis of SARS-CoV-2 by FDA under an Emergency Use Authorization (EUA). This EUA will remain in effect (meaning this test can be used) for the duration of the COVID-19 declaration under Section 564(b)(1) of the Act, 21 U.S.C. section 360bbb-3(b)(1), unless the authorization is terminated or revoked.  Performed at [facility], [address] [place], [place] [address]   Blood Culture ID Panel (Reflexed)     Status:  Abnormal   Collection Time: 09/02/20  5:13 PM  Result Value Ref Range Status   Enterococcus faecalis NOT DETECTED NOT DETECTED Final   Enterococcus Faecium NOT DETECTED NOT DETECTED Final   Listeria monocytogenes NOT DETECTED NOT DETECTED Final   Staphylococcus species DETECTED (A) NOT DETECTED Final    Comment: CRITICAL RESULT CALLED TO, READ BACK BY AND VERIFIED WITH:  AMY THOMPSON AT 1004 09/03/20 SDR    Staphylococcus aureus (BCID) NOT DETECTED NOT DETECTED Final   Staphylococcus epidermidis DETECTED (A) NOT DETECTED Final    Comment: Methicillin (oxacillin) resistant coagulase negative staphylococcus. Possible blood culture contaminant (unless isolated from more than one blood culture draw or clinical case suggests pathogenicity). No antibiotic treatment is indicated for blood  culture contaminants. CRITICAL RESULT CALLED TO, READ BACK BY AND VERIFIED WITH:  AMY THOMPSON AT 1004 09/03/20 SDR    Staphylococcus lugdunensis NOT DETECTED NOT DETECTED  Final   Streptococcus species NOT DETECTED NOT DETECTED Final   Streptococcus agalactiae NOT DETECTED NOT DETECTED Final   Streptococcus pneumoniae NOT DETECTED NOT DETECTED Final   Streptococcus pyogenes NOT DETECTED NOT DETECTED Final   A.calcoaceticus-baumannii NOT DETECTED NOT DETECTED Final   Bacteroides fragilis NOT DETECTED NOT DETECTED Final   Enterobacterales NOT DETECTED NOT DETECTED Final   Enterobacter cloacae complex NOT DETECTED NOT DETECTED Final   Escherichia coli NOT DETECTED NOT DETECTED Final   Klebsiella aerogenes NOT DETECTED NOT DETECTED Final   Klebsiella oxytoca NOT DETECTED NOT DETECTED Final   Klebsiella pneumoniae NOT DETECTED NOT DETECTED Final   Proteus species NOT DETECTED NOT DETECTED Final   Salmonella species NOT DETECTED NOT DETECTED Final   Serratia marcescens NOT DETECTED NOT DETECTED Final   Haemophilus influenzae NOT DETECTED NOT DETECTED Final   Neisseria meningitidis NOT DETECTED NOT DETECTED  Final   Pseudomonas aeruginosa NOT DETECTED NOT DETECTED Final   Stenotrophomonas maltophilia NOT DETECTED NOT DETECTED Final   Candida albicans NOT DETECTED NOT DETECTED Final   Candida auris NOT DETECTED NOT DETECTED Final   Candida glabrata NOT DETECTED NOT DETECTED Final   Candida krusei NOT DETECTED NOT DETECTED Final   Candida parapsilosis NOT DETECTED NOT DETECTED Final   Candida tropicalis NOT DETECTED NOT DETECTED Final   Cryptococcus neoformans/gattii NOT DETECTED NOT DETECTED Final   Methicillin resistance mecA/C DETECTED (A) NOT DETECTED Final    Comment: CRITICAL RESULT CALLED TO, READ BACK BY AND VERIFIED WITH:  AMY THOMPSON AT 1004 09/03/20 SDR Performed at [facility], [address] [place], [place] [address]   Blood culture (routine x 2)     Status: Abnormal   Collection Time: 09/02/20  5:25 PM   Specimen: BLOOD  Result Value Ref Range Status   Specimen Description   Final    BLOOD BLOOD RIGHT HAND Performed at [facility], [address] [place], [place] [address]    Special Requests   Final    BOTTLES DRAWN AEROBIC AND ANAEROBIC Blood Culture results may not be optimal due to an inadequate volume of blood received in culture bottles Performed at [facility], [address] [place], [place] [address]    Culture  Setup Time   Final    Organism ID to follow [place].  VALUE IS CONSISTENT WITH PREVIOUSLY REPORTED AND CALLED VALUE. GRAM POSITIVE COCCI ANAEROBIC BOTTLE ONLY CRITICAL RESULT CALLED TO, READ BACK BY AND VERIFIED WITH: AMY THOMPSON AT 1004 09/03/20 SDR Performed at [facility], [address] [place], [place] [address]    Culture (A)  Final    GROUP B STREP(S.AGALACTIAE)ISOLATED SUSCEPTIBILITIES PERFORMED ON PREVIOUS CULTURE WITHIN THE LAST 5 DAYS. STAPHYLOCOCCUS HAEMOLYTICUS THE SIGNIFICANCE OF ISOLATING THIS ORGANISM FROM A SINGLE SET OF  BLOOD CULTURES WHEN MULTIPLE SETS ARE DRAWN IS UNCERTAIN. PLEASE NOTIFY THE MICROBIOLOGY DEPARTMENT WITHIN ONE WEEK IF SPECIATION AND SENSITIVITIES ARE REQUIRED. Performed at [facility], [place] [address], [place], [place] [address]    Report Status 09/06/2020 FINAL  Final  Blood culture (routine x 2)     Status: Abnormal   Collection Time: 09/02/20  5:25 PM   Specimen: BLOOD  Result Value Ref Range Status   Specimen Description   Final    BLOOD BLOOD LEFT HAND Performed at [facility], [address], [place],  [address]    Special Requests   Final  BOTTLES DRAWN AEROBIC AND ANAEROBIC Blood Culture results may not be optimal due to an inadequate volume of blood received in culture bottles Performed at [facility], [address] [place], [place] [address]    Culture  Setup Time   Final    Organism ID to follow GRAM POSITIVE COCCI IN CHAINS IN BOTH AEROBIC AND ANAEROBIC BOTTLES CRITICAL RESULT CALLED TO, READ BACK BY AND VERIFIED WITH: JASON ROBBINS AT 1194 09/03/20 SDR Performed at [facility], [address], [place], [place] [address]    Culture (A)  Final    GROUP B STREP(S.AGALACTIAE)ISOLATED METHICILLIN RESISTANT STAPHYLOCOCCUS AUREUS CRITICAL RESULT CALLED TO, READ BACK BY AND VERIFIED WITH: PHARMD THOMPSON, A. [id number] FCP Performed at [facility], [address] [place], [place] [address]    Report Status 09/06/2020 FINAL  Final   Organism ID, Bacteria GROUP B STREP(S.AGALACTIAE)ISOLATED  Final   Organism ID, Bacteria METHICILLIN RESISTANT STAPHYLOCOCCUS AUREUS  Final      Susceptibility   Group b strep(s.agalactiae)isolated - MIC*    CLINDAMYCIN >=1 RESISTANT Resistant     AMPICILLIN <=0.25 SENSITIVE Sensitive     ERYTHROMYCIN >=8 RESISTANT Resistant     VANCOMYCIN 0.5 SENSITIVE Sensitive     CEFTRIAXONE <=0.12 SENSITIVE Sensitive     LEVOFLOXACIN 1 SENSITIVE Sensitive     * GROUP B  STREP(S.AGALACTIAE)ISOLATED   Methicillin resistant staphylococcus aureus - MIC*    CIPROFLOXACIN >=8 RESISTANT Resistant     ERYTHROMYCIN >=8 RESISTANT Resistant     GENTAMICIN <=0.5 SENSITIVE Sensitive     OXACILLIN >=4 RESISTANT Resistant     TETRACYCLINE >=16 RESISTANT Resistant     VANCOMYCIN 1 SENSITIVE Sensitive     TRIMETH/SULFA >=320 RESISTANT Resistant     CLINDAMYCIN >=8 RESISTANT Resistant     RIFAMPIN <=0.5 SENSITIVE Sensitive     Inducible Clindamycin NEGATIVE Sensitive     * METHICILLIN RESISTANT STAPHYLOCOCCUS AUREUS  Urine Culture     Status: Abnormal   Collection Time: 09/02/20  5:25 PM   Specimen: Urine, Random  Result Value Ref Range Status   Specimen Description   Final    URINE, RANDOM Performed at [facility], [address], [place], [place] [address]    Special Requests   Final    NONE Performed at [facility], [address] [place], [place] [address]    Culture 70,000 COLONIES/mL PROTEUS MIRABILIS (A)  Final   Report Status 09/04/2020 FINAL  Final   Organism ID, Bacteria PROTEUS MIRABILIS (A)  Final      Susceptibility   Proteus mirabilis - MIC*    AMPICILLIN <=2 SENSITIVE Sensitive     CEFAZOLIN 8 SENSITIVE Sensitive     CEFEPIME <=0.12 SENSITIVE Sensitive     CEFTRIAXONE <=0.25 SENSITIVE Sensitive     CIPROFLOXACIN <=0.25 SENSITIVE Sensitive     GENTAMICIN <=1 SENSITIVE Sensitive     IMIPENEM 1 SENSITIVE Sensitive     NITROFURANTOIN 128 RESISTANT Resistant     TRIMETH/SULFA <=20 SENSITIVE Sensitive     AMPICILLIN/SULBACTAM <=2 SENSITIVE Sensitive     PIP/TAZO <=4 SENSITIVE Sensitive     * 70,000 COLONIES/mL PROTEUS MIRABILIS  Blood Culture ID Panel (Reflexed)     Status: Abnormal   Collection Time: 09/02/20  5:25 PM  Result Value Ref Range Status   Enterococcus faecalis NOT DETECTED NOT DETECTED Final   Enterococcus Faecium NOT DETECTED NOT DETECTED Final   Listeria monocytogenes NOT DETECTED NOT DETECTED Final  Staphylococcus species NOT DETECTED NOT DETECTED Final   Staphylococcus aureus (BCID) NOT DETECTED NOT DETECTED Final   Staphylococcus epidermidis NOT DETECTED NOT DETECTED Final   Staphylococcus lugdunensis NOT DETECTED NOT DETECTED Final   Streptococcus species DETECTED (A) NOT DETECTED Final    Comment: CRITICAL RESULT CALLED TO, READ BACK BY AND VERIFIED WITH: JASON ROBBINS AT 0715 09/03/20 SDR    Streptococcus agalactiae DETECTED (A) NOT DETECTED Final    Comment: CRITICAL RESULT CALLED TO, READ BACK BY AND VERIFIED WITH:  JASON ROBBINS AT 0715 09/03/20 SDR    Streptococcus pneumoniae NOT DETECTED NOT DETECTED Final   Streptococcus pyogenes NOT DETECTED NOT DETECTED Final   A.calcoaceticus-baumannii NOT DETECTED NOT DETECTED Final   Bacteroides fragilis NOT DETECTED NOT DETECTED Final   Enterobacterales NOT DETECTED NOT DETECTED Final   Enterobacter cloacae complex NOT DETECTED NOT DETECTED Final   Escherichia coli NOT DETECTED NOT DETECTED Final   Klebsiella aerogenes NOT DETECTED NOT DETECTED Final   Klebsiella oxytoca NOT DETECTED NOT DETECTED Final   Klebsiella pneumoniae NOT DETECTED NOT DETECTED Final   Proteus species NOT DETECTED NOT DETECTED Final   Salmonella species NOT DETECTED NOT DETECTED Final   Serratia marcescens NOT DETECTED NOT DETECTED Final   Haemophilus influenzae NOT DETECTED NOT DETECTED Final   Neisseria meningitidis NOT DETECTED NOT DETECTED Final   Pseudomonas aeruginosa NOT DETECTED NOT DETECTED Final   Stenotrophomonas maltophilia NOT DETECTED NOT DETECTED Final   Candida albicans NOT DETECTED NOT DETECTED Final   Candida auris NOT DETECTED NOT DETECTED Final   Candida glabrata NOT DETECTED NOT DETECTED Final   Candida krusei NOT DETECTED NOT DETECTED Final   Candida parapsilosis NOT DETECTED NOT DETECTED Final   Candida tropicalis NOT DETECTED NOT DETECTED Final   Cryptococcus neoformans/gattii NOT DETECTED NOT DETECTED Final    Comment:  Performed at [facility], [address] [place], [place] [address]  Culture, blood (Routine X 2) w Reflex to ID Panel     Status: None (Preliminary result)   Collection Time: 09/05/20  3:02 PM   Specimen: BLOOD  Result Value Ref Range Status   Specimen Description BLOOD RIGHT ANTECUBITAL  Final   Special Requests   Final    BOTTLES DRAWN AEROBIC AND ANAEROBIC Blood Culture adequate volume   Culture   Final    NO GROWTH < 24 HOURS Performed at [facility], [address] [place], [place] [address]    Report Status PENDING  Incomplete  Culture, blood (Routine X 2) w Reflex to ID Panel     Status: None (Preliminary result)   Collection Time: 09/05/20  3:04 PM   Specimen: BLOOD  Result Value Ref Range Status   Specimen Description BLOOD BLOOD LEFT HAND  Final   Special Requests   Final    BOTTLES DRAWN AEROBIC AND ANAEROBIC Blood Culture adequate volume   Culture   Final    NO GROWTH < 24 HOURS Performed at [facility], [address] [place], [place] [address]    Report Status PENDING  Incomplete  MRSA PCR Screening     Status: Abnormal   Collection Time: 09/06/20 12:04 PM   Specimen: Nasopharyngeal  Result Value Ref Range Status   MRSA by PCR POSITIVE (A) NEGATIVE Final    Comment:        The GeneXpert MRSA Assay (FDA approved for NASAL specimens only), is one component of a comprehensive MRSA colonization surveillance program. It is not intended to diagnose  MRSA infection nor to guide or monitor treatment for MRSA infections. RESULT CALLED TO, READ BACK BY AND VERIFIED WITH: KAYLA BRITTON AT 1330 ON 09/06/2020 BY SDR. [facility] Performed at [facility], [address], [place], [place] [address]      Studies: No results found.  Scheduled Meds: . aspirin EC  81 mg Oral Daily  . atorvastatin  40 mg Oral QPM  . calcium-vitamin D  1 tablet Oral BID  . [START ON 09/07/2020] Chlorhexidine Gluconate Cloth  6 each Topical Q0600  .  enoxaparin (LOVENOX) injection  0.5 mg/kg Subcutaneous Q24H  . fluticasone  2 spray Each Nare Daily  . imipramine  25 mg Oral QHS  . [START ON 09/07/2020] levothyroxine  25 mcg Oral Q0600  . magnesium oxide  400 mg Oral Daily  . metoprolol succinate  25 mg Oral Daily  . metoprolol tartrate  5 mg Intravenous Once  . midodrine  2.5 mg Oral TID WC  . mirabegron ER  50 mg Oral Daily  . mupirocin ointment  1 application Nasal BID   Continuous Infusions: . vancomycin      Assessment/Plan:  1. Sepsis, present on admission with strep agalactiae and left thigh cellulitis.  Patient also had leukocytosis, lactic acidosis and tachycardia.  Also in 1 blood cultures had a staph aureus and the other blood culture sent is staph hemolyticus and another set staph epidermidis.  Not quite sure if all of the staffs were contaminated or not.  Antibiotics changed over to vancomycin.  Repeat blood cultures done yesterday are so far negative with less than 24 hours of growth. 2. SVT last night required adenosine.  Will restart low-dose Toprol-XL and obtain an echocardiogram. 3. Acute kidney injury on chronic kidney disease stage II.  During the hospital course creatinine went from 1.25 up to 1.52 and today down to 0.98 with a GFR greater than 60. 4. Chronic hypotension on home midodrine 5. Hyperlipidemia on atorvastatin 6. Obesity with a BMI of 39.05 7. Weakness.  Physical therapy recommends rehab 8. Hypothyroidism unspecified TSH slightly elevated at 9.  Start low-dose Synthroid.     Code Status:     Code Status Orders  (From admission, onward)         Start     Ordered   09/02/20 2024  Full code  Continuous        09/02/20 2025        Code Status History    Date Active Date Inactive Code Status Order ID Comments User Context   10/28/2019 0506 11/07/2019 2326 Full Code [id number]  Christel Mormon, MD Inpatient   08/07/2019 1604 08/09/2019 1856 Full Code [id number]  Para Skeans, MD ED   08/07/2019  1316 08/07/2019 1604 Full Code [id number]  Para Skeans, MD ED   11/19/2017 1141 11/22/2017 1429 Full Code [id number]  Hessie Knows, MD Inpatient   03/11/2016 1143 03/14/2016 2107 Full Code [id number]  Hessie Knows, MD Inpatient   Advance Care Planning Activity    Advance Directive Documentation   Flowsheet Row Most Recent Value  Type of Advance Directive Living will  Pre-existing out of facility DNR order (yellow form or pink MOST form) --  "MOST" Form in Place? --     Family Communication: Left message for patient's husband Disposition Plan: Status is: Inpatient  Dispo: The patient is from: Home              Anticipated d/c is to: Home health versus rehab depending  on clinical course              Anticipated d/c date is: Likely will need a few days of IV antibiotics.  Infectious disease then has to set up a plan.              Patient currently requiring IV vancomycin with multiple organisms and blood cultures.  Consultants:  Infectious disease  Antibiotics:  IV vancomycin  Time spent: 27 minutes  [place]

## 2020-09-06 NOTE — Progress Notes (Signed)
Pt HR sustaining in 160's. Ouma NP made aware. New orders received. Np come to beside to see the pt.

## 2020-09-06 NOTE — Progress Notes (Signed)
Occupational Therapy Treatment Patient Details Name: Becky Trevino MRN: [id number] DOB: [birth year] Today's Date: 09/06/2020    History of present illness 74 y.o. female with medical history significant of DDD/spondylosis of cervical spine, stage III chronic kidney disease, type II DM, diverticulosis, hyperlipidemia, hypertension, intent trigger, leg cramps, varicose veins of lower extremities, lymphedema, obesity, OSA, syncopal episode, chronic diastolic CHF who is coming to the emergency department via EMS 2 days in a row due to progressively worse chills, rigors, fever, frontal headache, lightheadedness, decreased appetite, fatigue and malaise, which she said started about a week ago.  The patient stated that she was having RLE pain due to sitting in her wheelchair.  She tried to get up, but her RLE was too weak to stand and fell.   OT comments  Pt seen for OT treatment on this date. Upon arrival to room pt reclined in bed, agreeable to tx. MIN A + single UE support seated grooming tasks, intermittently improves to CGA - assist for posterior lean. Tolerated ~8 min seated EOB therex as described below. Pt making good progress toward goals. Pt continues to benefit from skilled OT services to maximize return to PLOF and minimize risk of future falls, injury, caregiver burden, and readmission. Will continue to follow POC. Discharge recommendation remains appropriate.    Follow Up Recommendations  SNF    Equipment Recommendations  Other (comment) (TBD)    Recommendations for Other Services      Precautions / Restrictions Precautions Precautions: Fall Restrictions Weight Bearing Restrictions: No       Mobility Bed Mobility Overal bed mobility: Needs Assistance Bed Mobility: Supine to Sit;Sit to Supine     Supine to sit: Mod assist Sit to supine: Max assist         Balance Overall balance assessment: Needs assistance   Sitting balance-Leahy Scale: Good   Postural control:  Posterior lean                                 ADL either performed or assessed with clinical judgement   ADL Overall ADL's : Needs assistance/impaired                                       General ADL Comments: MIN A + single UE support seated grooming tasks, intermittently improves to CGA - assist for posterior lean               Cognition Arousal/Alertness: Awake/alert Behavior During Therapy: WFL for tasks assessed/performed Overall Cognitive Status: Within Functional Limits for tasks assessed                                          Exercises Exercises: General Lower Extremity;Other exercises General Exercises - Lower Extremity Long Arc Quad: AROM;Strengthening;Both;20 reps;Seated Straight Leg Raises: AROM;Strengthening;Both;20 reps;Seated Hip Flexion/Marching: AROM;Strengthening;Both;20 reps;Seated Other Exercises Other Exercises: Pt educated re: d/c recs, falls prevention, HEP Other Exercises: LBD, face washing, hair brushing, sup<>sit, sitting/standing balance/tolerance           Pertinent Vitals/ Pain       Pain Assessment: No/denies pain         Frequency  Min 2X/week        Progress Toward Goals  OT Goals(current  goals can now be found in the care plan section)  Progress towards OT goals: Progressing toward goals  Acute Rehab OT Goals Patient Stated Goal: Pt very much wants to go home/not want to go to rehab OT Goal Formulation: With patient Time For Goal Achievement: 09/18/20 Potential to Achieve Goals: Fair ADL Goals Pt Will Perform Grooming: sitting;with set-up;with supervision Pt Will Transfer to Toilet: bedside commode;stand pivot transfer;with mod assist Pt Will Perform Toileting - Clothing Manipulation and hygiene: sit to/from stand;with adaptive equipment;with mod assist Additional ADL Goal #1: Pt will independently verbalize a plan to implement at least 3 learned falls prevention  strategies into her daily routines/home environment for improved safety and functional independence upon hospital DC.  Plan Discharge plan remains appropriate;Frequency remains appropriate       AM-PAC OT "6 Clicks" Daily Activity     Outcome Measure   Help from another person eating meals?: A Little Help from another person taking care of personal grooming?: A Little Help from another person toileting, which includes using toliet, bedpan, or urinal?: A Lot Help from another person bathing (including washing, rinsing, drying)?: A Lot Help from another person to put on and taking off regular upper body clothing?: A Little Help from another person to put on and taking off regular lower body clothing?: A Lot 6 Click Score: 15    End of Session    OT Visit Diagnosis: Other abnormalities of gait and mobility (R26.89);Muscle weakness (generalized) (M62.81);Pain   Activity Tolerance Patient tolerated treatment well   Patient Left in bed;with call bell/phone within reach;with bed alarm set   Nurse Communication          Time: 9381-8299 OT Time Calculation (min): 23 min  Charges: OT General Charges $OT Visit: 1 Visit OT Treatments $Self Care/Home Management : 8-22 mins $Therapeutic Exercise: 8-22 mins  Dessie Coma, M.S. OTR/L  09/06/20, 5:00 PM  ascom [phone]

## 2020-09-07 ENCOUNTER — Inpatient Hospital Stay
Admit: 2020-09-07 | Discharge: 2020-09-07 | Disposition: A | Discharge status: Home / Self Care | Payer: BC Managed Care – PPO | Attending: Internal Medicine | Admitting: Internal Medicine

## 2020-09-07 DIAGNOSIS — N179 Acute kidney failure, unspecified: Secondary | ICD-10-CM | POA: Diagnosis not present

## 2020-09-07 DIAGNOSIS — E039 Hypothyroidism, unspecified: Secondary | ICD-10-CM

## 2020-09-07 DIAGNOSIS — A401 Sepsis due to streptococcus, group B: Secondary | ICD-10-CM | POA: Diagnosis not present

## 2020-09-07 DIAGNOSIS — R531 Weakness: Secondary | ICD-10-CM

## 2020-09-07 DIAGNOSIS — I9589 Other hypotension: Secondary | ICD-10-CM | POA: Diagnosis not present

## 2020-09-07 DIAGNOSIS — I471 Supraventricular tachycardia: Secondary | ICD-10-CM | POA: Diagnosis not present

## 2020-09-07 LAB — ECHOCARDIOGRAM COMPLETE
AR max vel: 2.88 cm2
AV Area VTI: 3.46 cm2
AV Area mean vel: 3.11 cm2
AV Mean grad: 4 mmHg
AV Peak grad: 8.2 mmHg
Ao pk vel: 1.43 m/s
Area-P 1/2: 6.77 cm2
Height: 71 in
S' Lateral: 2.4 cm
Weight: 4480 oz

## 2020-09-07 LAB — BASIC METABOLIC PANEL
Anion gap: 10 (ref 5–15)
BUN: 19 mg/dL (ref 8–23)
CO2: 24 mmol/L (ref 22–32)
Calcium: 8.9 mg/dL (ref 8.9–10.3)
Chloride: 102 mmol/L (ref 98–111)
Creatinine, Ser: 1.1 mg/dL — ABNORMAL HIGH (ref 0.44–1.00)
GFR, Estimated: 53 mL/min — ABNORMAL LOW (ref >60)
Glucose, Bld: 91 mg/dL (ref 70–99)
Potassium: 3.9 mmol/L (ref 3.5–5.1)
Sodium: 136 mmol/L (ref 135–145)

## 2020-09-07 LAB — CBC
HCT: 33.7 % — ABNORMAL LOW (ref 36.0–46.0)
Hemoglobin: 10.8 g/dL — ABNORMAL LOW (ref 12.0–15.0)
MCH: 30.3 pg (ref 26.0–34.0)
MCHC: 32 g/dL (ref 30.0–36.0)
MCV: 94.7 fL (ref 80.0–100.0)
Platelets: 158 10*3/uL (ref 150–400)
RBC: 3.56 MIL/uL — ABNORMAL LOW (ref 3.87–5.11)
RDW: 17.2 % — ABNORMAL HIGH (ref 11.5–15.5)
WBC: 7.2 10*3/uL (ref 4.0–10.5)
nRBC: 0.7 % — ABNORMAL HIGH (ref 0.0–0.2)

## 2020-09-07 MED ORDER — FUROSEMIDE 20 MG PO TABS
20.0000 mg | ORAL_TABLET | Freq: Every day | ORAL | Status: DC
  Administered 2020-09-08: 20 mg via ORAL
  Filled 2020-09-07: qty 1

## 2020-09-07 MED ORDER — MIDODRINE HCL 5 MG PO TABS
5.0000 mg | ORAL_TABLET | Freq: Three times a day (TID) | ORAL | Status: DC
  Administered 2020-09-07 – 2020-09-09 (×6): 5 mg via ORAL
  Filled 2020-09-07 (×6): qty 1

## 2020-09-07 MED ORDER — BENZONATATE 100 MG PO CAPS
100.0000 mg | ORAL_CAPSULE | Freq: Three times a day (TID) | ORAL | Status: DC
  Administered 2020-09-07 – 2020-09-14 (×20): 100 mg via ORAL
  Filled 2020-09-07 (×21): qty 1

## 2020-09-07 MED ORDER — VANCOMYCIN HCL 1500 MG/300ML IV SOLN
1500.0000 mg | INTRAVENOUS | Status: DC
  Administered 2020-09-07 – 2020-09-09 (×3): 1500 mg via INTRAVENOUS
  Filled 2020-09-07 (×4): qty 300

## 2020-09-07 NOTE — Progress Notes (Signed)
Physical Therapy Treatment Patient Details Name: Becky Trevino MRN: [id number] DOB: 03-05-1946 Today's Date: 09/07/2020    History of Present Illness 74 y.o. female with medical history significant of DDD/spondylosis of cervical spine, stage III chronic kidney disease, type II DM, diverticulosis, hyperlipidemia, hypertension, intent trigger, leg cramps, varicose veins of lower extremities, lymphedema, obesity, OSA, syncopal episode, chronic diastolic CHF who is coming to the emergency department via EMS 2 days in a row due to progressively worse chills, rigors, fever, frontal headache, lightheadedness, decreased appetite, fatigue and malaise, which she said started about a week ago.  The patient stated that she was having RLE pain due to sitting in her wheelchair.  She tried to get up, but her RLE was too weak to stand and fell.    PT Comments    Pt was supine in bed with HOB elevated ~ 20 degrees. A& O x 4. She agrees to PT session and is cooperative and pleasant throughout.  Supportive spouse at bedside and willing to assist session throughout. +2 assistance required for safety. Mod assist to exit bed with max assist to return to supine after performing transfers. She stood 3 x EOB from elevated bed height to bariatric RW. On third standing attempt, pt was able to take ~ 3 side steps to [facility] from FOB. Did require total assist to slide up to [facility] and be repositioned properly. Highly recommend DC to SNF to address deficits while improving independence. RN aware of pt's abilities at conclusion of session.   Follow Up Recommendations  SNF;Supervision/Assistance - 24 hour     Equipment Recommendations  Other (comment) (defer to next level of care)    Recommendations for Other Services       Precautions / Restrictions Precautions Precautions: Fall Restrictions Weight Bearing Restrictions: No    Mobility  Bed Mobility Overal bed mobility: Needs Assistance Bed Mobility: Supine to Sit;Sit  to Supine     Supine to sit: Mod assist Sit to supine: Max assist   General bed mobility comments: mod assist to exit R side of bed with max assist to return to supine. Increased time to perform all mobility.  Transfers Overall transfer level: Needs assistance Equipment used: Rolling walker (2 wheeled) (bariatric) Transfers: Sit to/from Stand Sit to Stand: +2 safety/equipment;Mod assist;From elevated surface         General transfer comment: Pt performed STS 3 x EOB with ebd height elevated. +2 for safety however only mod assist of one to actually stand. lowered bed height after pt stood to prevent pt sliding off EOB. Overall pt tolerated standing well and was able to take 3 lateral side steps to [facility] from FOB prior to returning to sitting EOB.  Ambulation/Gait Ambulation/Gait assistance: +2 safety/equipment Gait Distance (Feet): 3 Feet Assistive device: Rolling walker (2 wheeled) (bariatric RW) Gait Pattern/deviations: Step-to pattern Gait velocity: decreased   General Gait Details: pt took 3 side steps from FOB to [facility]. incrased time with therapist protetciung LLE from buckling      Balance Overall balance assessment: Needs assistance   Sitting balance-Leahy Scale: Good Sitting balance - Comments: no LOB seated EOB with feet suupport on floor   Standing balance support: Bilateral upper extremity supported;During functional activity Standing balance-Leahy Scale: Fair Standing balance comment: reliant on UE support to maintain balance        Cognition Arousal/Alertness: Awake/alert Behavior During Therapy: WFL for tasks assessed/performed Overall Cognitive Status: Within Functional Limits for tasks assessed      General Comments:  Pt is A and O x 4. cooperative and pleasant.             Pertinent Vitals/Pain Pain Assessment: No/denies pain           PT Goals (current goals can now be found in the care plan section) Acute Rehab PT Goals Patient Stated Goal: go  to rehab, get better, go home Progress towards PT goals: Progressing toward goals    Frequency    Min 2X/week      PT Plan Current plan remains appropriate       AM-PAC PT "6 Clicks" Mobility   Outcome Measure  Help needed turning from your back to your side while in a flat bed without using bedrails?: A Lot Help needed moving from lying on your back to sitting on the side of a flat bed without using bedrails?: A Lot Help needed moving to and from a bed to a chair (including a wheelchair)?: A Lot Help needed standing up from a chair using your arms (e.g., wheelchair or bedside chair)?: Total Help needed to walk in hospital room?: Total Help needed climbing 3-5 steps with a railing? : Total 6 Click Score: 9    End of Session Equipment Utilized During Treatment: Gait belt Activity Tolerance: Patient tolerated treatment well;Patient limited by fatigue Patient left: with bed alarm set;with call bell/phone within reach Nurse Communication: Mobility status PT Visit Diagnosis: Muscle weakness (generalized) (M62.81);Difficulty in walking, not elsewhere classified (R26.2);Pain;History of falling (Z91.81) Pain - Right/Left: Right Pain - part of body: Hip     Time: 9798-9211 PT Time Calculation (min) (ACUTE ONLY): 29 min  Charges:  $Therapeutic Activity: 23-37 mins                     Julaine Fusi PTA 09/07/20, 4:14 PM

## 2020-09-07 NOTE — Progress Notes (Signed)
Patient ID: Becky Trevino, female   DOB: Feb 03, 1946, 74 y.o.   MRN: [id number] [facility] PROGRESS NOTE  Becky Trevino [id number] DOB: [birth year] DOA: 09/02/2020 PCP: [name], MD  HPI/Subjective: Patient feels okay.  Offers no complaints except for feeling weak.  Interested in going to rehab but awaiting bed offers.  Admitted with sepsis.  Objective: Vitals:   09/07/20 0403 09/07/20 0750  BP: 108/65 (!) 84/45  Pulse: 87 86  Resp: 18 18  Temp: 98.5 F (36.9 C) 98.6 F (37 C)  SpO2: 99% 97%    Intake/Output Summary (Last 24 hours) at 09/07/2020 1419 Last data filed at 09/07/2020 0300 Gross per 24 hour  Intake 350 ml  Output 700 ml  Net -350 ml   Filed Weights   09/02/20 1538  Weight: 127 kg    ROS: Review of Systems  Respiratory: Positive for cough. Negative for shortness of breath.   Cardiovascular: Negative for chest pain.   Exam: Physical Exam HENT:     Head: Normocephalic.     Mouth/Throat:     Pharynx: No oropharyngeal exudate.  Eyes:     General: Lids are normal.     Conjunctiva/sclera: Conjunctivae normal.     Pupils: Pupils are equal, round, and reactive to light.  Cardiovascular:     Rate and Rhythm: Normal rate and regular rhythm.     Heart sounds: Normal heart sounds, S1 normal and S2 normal.  Pulmonary:     Breath sounds: No decreased breath sounds, wheezing, rhonchi or rales.  Abdominal:     Palpations: Abdomen is soft.     Tenderness: There is no abdominal tenderness.  Musculoskeletal:     Right ankle: Swelling present.     Left ankle: Swelling present.  Skin:    General: Skin is warm.     Findings: No rash.  Neurological:     Mental Status: She is alert and oriented to person, place, and time.       Data Reviewed: Basic Metabolic Panel: Recent Labs  Lab 09/03/20 0415 09/04/20 0406 09/05/20 0416 09/06/20 0417 09/07/20 0510  NA 136 136 136 137 136  K 3.6 3.6 4.1 3.9 3.9  CL 103 102 101 102 102  CO2 24 26  24 24 24   GLUCOSE 78 96 94 87 91  BUN 17 15 13 14 19   CREATININE 1.18* 1.19* 1.13* 0.98 1.10*  CALCIUM 8.3* 8.7* 8.8* 8.9 8.9  MG  --   --   --  2.4  --    Liver Function Tests: Recent Labs  Lab 09/02/20 1542 09/03/20 0415 09/04/20 0406  AST 35 34 27  ALT 18 18 18   ALKPHOS 82 81 81  BILITOT 1.3* 1.4* 1.1  PROT 7.6 7.5 7.5  ALBUMIN 3.3* 3.1* 3.0*   CBC: Recent Labs  Lab 09/02/20 1542 09/03/20 0415 09/04/20 0406 09/05/20 0416 09/06/20 0416 09/07/20 0510  WBC 10.4 7.3 7.1 6.2 6.4 7.2  NEUTROABS 9.6* 6.5 5.7 4.4 4.6  --   HGB 11.0* 10.8* 10.7* 11.5* 11.8* 10.8*  HCT 33.9* 33.6* 32.8* 36.0 35.8* 33.7*  MCV 96.3 94.9 95.6 95.2 94.5 94.7  PLT 123* 111* 110* 120* 140* 158   BNP (last 3 results) Recent Labs    09/02/20 1542  BNP 224.4*      Recent Results (from the past 240 hour(s))  Resp Panel by RT-PCR (Flu A&B, Covid) Nasopharyngeal Swab     Status: None   Collection Time: 09/02/20  4:44 PM  Specimen: Nasopharyngeal Swab; Nasopharyngeal(NP) swabs in vial transport medium  Result Value Ref Range Status   SARS Coronavirus 2 by RT PCR NEGATIVE NEGATIVE Final    Comment: (NOTE) SARS-CoV-2 target nucleic acids are NOT DETECTED.  The SARS-CoV-2 RNA is generally detectable in upper respiratory specimens during the acute phase of infection. The lowest concentration of SARS-CoV-2 viral copies this assay can detect is 138 copies/mL. A negative result does not preclude SARS-Cov-2 infection and should not be used as the sole basis for treatment or other patient management decisions. A negative result may occur with  improper specimen collection/handling, submission of specimen other than nasopharyngeal swab, presence of viral mutation(s) within the areas targeted by this assay, and inadequate number of viral copies(<138 copies/mL). A negative result must be combined with clinical observations, patient history, and epidemiological information. The expected result is  Negative.  Fact Sheet for Patients:  EntrepreneurPulse.com.au  Fact Sheet for Healthcare Providers:  IncredibleEmployment.be  This test is no t yet approved or cleared by the [place] FDA and  has been authorized for detection and/or diagnosis of SARS-CoV-2 by FDA under an Emergency Use Authorization (EUA). This EUA will remain  in effect (meaning this test can be used) for the duration of the COVID-19 declaration under Section 564(b)(1) of the Act, 21 U.S.C.section 360bbb-3(b)(1), unless the authorization is terminated  or revoked sooner.       Influenza A by PCR NEGATIVE NEGATIVE Final   Influenza B by PCR NEGATIVE NEGATIVE Final    Comment: (NOTE) The Xpert Xpress SARS-CoV-2/FLU/RSV plus assay is intended as an aid in the diagnosis of influenza from Nasopharyngeal swab specimens and should not be used as a sole basis for treatment. Nasal washings and aspirates are unacceptable for Xpert Xpress SARS-CoV-2/FLU/RSV testing.  Fact Sheet for Patients: EntrepreneurPulse.com.au  Fact Sheet for Healthcare Providers: IncredibleEmployment.be  This test is not yet approved or cleared by the [place] FDA and has been authorized for detection and/or diagnosis of SARS-CoV-2 by FDA under an Emergency Use Authorization (EUA). This EUA will remain in effect (meaning this test can be used) for the duration of the COVID-19 declaration under Section 564(b)(1) of the Act, 21 U.S.C. section 360bbb-3(b)(1), unless the authorization is terminated or revoked.  Performed at [facility], [address] [place], [place] [address]   Blood Culture ID Panel (Reflexed)     Status: Abnormal   Collection Time: 09/02/20  5:13 PM  Result Value Ref Range Status   Enterococcus faecalis NOT DETECTED NOT DETECTED Final   Enterococcus Faecium NOT DETECTED NOT DETECTED Final   Listeria monocytogenes NOT DETECTED NOT  DETECTED Final   Staphylococcus species DETECTED (A) NOT DETECTED Final    Comment: CRITICAL RESULT CALLED TO, READ BACK BY AND VERIFIED WITH:  AMY THOMPSON AT 1004 09/03/20 SDR    Staphylococcus aureus (BCID) NOT DETECTED NOT DETECTED Final   Staphylococcus epidermidis DETECTED (A) NOT DETECTED Final    Comment: Methicillin (oxacillin) resistant coagulase negative staphylococcus. Possible blood culture contaminant (unless isolated from more than one blood culture draw or clinical case suggests pathogenicity). No antibiotic treatment is indicated for blood  culture contaminants. CRITICAL RESULT CALLED TO, READ BACK BY AND VERIFIED WITH:  AMY THOMPSON AT 1004 09/03/20 SDR    Staphylococcus lugdunensis NOT DETECTED NOT DETECTED Final   Streptococcus species NOT DETECTED NOT DETECTED Final   Streptococcus agalactiae NOT DETECTED NOT DETECTED Final   Streptococcus pneumoniae NOT DETECTED NOT DETECTED Final   Streptococcus pyogenes NOT  DETECTED NOT DETECTED Final   A.calcoaceticus-baumannii NOT DETECTED NOT DETECTED Final   Bacteroides fragilis NOT DETECTED NOT DETECTED Final   Enterobacterales NOT DETECTED NOT DETECTED Final   Enterobacter cloacae complex NOT DETECTED NOT DETECTED Final   Escherichia coli NOT DETECTED NOT DETECTED Final   Klebsiella aerogenes NOT DETECTED NOT DETECTED Final   Klebsiella oxytoca NOT DETECTED NOT DETECTED Final   Klebsiella pneumoniae NOT DETECTED NOT DETECTED Final   Proteus species NOT DETECTED NOT DETECTED Final   Salmonella species NOT DETECTED NOT DETECTED Final   Serratia marcescens NOT DETECTED NOT DETECTED Final   Haemophilus influenzae NOT DETECTED NOT DETECTED Final   Neisseria meningitidis NOT DETECTED NOT DETECTED Final   Pseudomonas aeruginosa NOT DETECTED NOT DETECTED Final   Stenotrophomonas maltophilia NOT DETECTED NOT DETECTED Final   Candida albicans NOT DETECTED NOT DETECTED Final   Candida auris NOT DETECTED NOT DETECTED Final    Candida glabrata NOT DETECTED NOT DETECTED Final   Candida krusei NOT DETECTED NOT DETECTED Final   Candida parapsilosis NOT DETECTED NOT DETECTED Final   Candida tropicalis NOT DETECTED NOT DETECTED Final   Cryptococcus neoformans/gattii NOT DETECTED NOT DETECTED Final   Methicillin resistance mecA/C DETECTED (A) NOT DETECTED Final    Comment: CRITICAL RESULT CALLED TO, READ BACK BY AND VERIFIED WITH:  AMY THOMPSON AT 1004 09/03/20 SDR Performed at [facility], [address] [place], [place] [address]   Blood culture (routine x 2)     Status: Abnormal   Collection Time: 09/02/20  5:25 PM   Specimen: BLOOD  Result Value Ref Range Status   Specimen Description   Final    BLOOD BLOOD RIGHT HAND Performed at [facility], [address] [place], [place] [address]    Special Requests   Final    BOTTLES DRAWN AEROBIC AND ANAEROBIC Blood Culture results may not be optimal due to an inadequate volume of blood received in culture bottles Performed at [facility], [address] [place], [place] [address]    Culture  Setup Time   Final    Organism ID to follow [place].  VALUE IS CONSISTENT WITH PREVIOUSLY REPORTED AND CALLED VALUE. GRAM POSITIVE COCCI ANAEROBIC BOTTLE ONLY CRITICAL RESULT CALLED TO, READ BACK BY AND VERIFIED WITH: AMY THOMPSON AT 1004 09/03/20 SDR Performed at [facility], [address] [place], [place] [address]    Culture (A)  Final    GROUP B STREP(S.AGALACTIAE)ISOLATED SUSCEPTIBILITIES PERFORMED ON PREVIOUS CULTURE WITHIN THE LAST 5 DAYS. STAPHYLOCOCCUS HAEMOLYTICUS THE SIGNIFICANCE OF ISOLATING THIS ORGANISM FROM A SINGLE SET OF BLOOD CULTURES WHEN MULTIPLE SETS ARE DRAWN IS UNCERTAIN. PLEASE NOTIFY THE MICROBIOLOGY DEPARTMENT WITHIN ONE WEEK IF SPECIATION AND SENSITIVITIES ARE REQUIRED. Performed at [facility], [place] [address] [place], [place]  [address]    Report Status 09/06/2020 FINAL  Final  Blood culture (routine x 2)     Status: Abnormal   Collection Time: 09/02/20  5:25 PM   Specimen: BLOOD  Result Value Ref Range Status   Specimen Description   Final    BLOOD BLOOD LEFT HAND Performed at [facility], [address] [place], [place] [address]    Special Requests   Final    BOTTLES DRAWN AEROBIC AND ANAEROBIC Blood Culture results may not be optimal due to an inadequate volume of blood received in culture bottles Performed at [facility], [address] [place], [place] [address]    Culture  Setup Time   Final    Organism ID to follow GRAM POSITIVE COCCI IN CHAINS IN BOTH AEROBIC AND ANAEROBIC BOTTLES CRITICAL RESULT CALLED TO, READ BACK BY AND VERIFIED WITH: JASON ROBBINS AT [id number] 09/03/20 [place] Performed at [facility], [address], [place], [place] [address]    Culture (A)  Final    GROUP B STREP(S.AGALACTIAE)ISOLATED METHICILLIN RESISTANT STAPHYLOCOCCUS AUREUS CRITICAL RESULT CALLED TO, READ BACK BY AND VERIFIED WITH: PHARMD THOMPSON, A. [id number] FCP Performed at [facility], [place] [address], [place], [place] [address]    Report Status 09/06/2020 FINAL  Final   Organism ID, Bacteria GROUP B STREP(S.AGALACTIAE)ISOLATED  Final   Organism ID, Bacteria METHICILLIN RESISTANT STAPHYLOCOCCUS AUREUS  Final      Susceptibility   Group b strep(s.agalactiae)isolated - MIC*    CLINDAMYCIN >=1 RESISTANT Resistant     AMPICILLIN <=0.25 SENSITIVE Sensitive     ERYTHROMYCIN >=8 RESISTANT Resistant     VANCOMYCIN 0.5 SENSITIVE Sensitive     CEFTRIAXONE <=0.12 SENSITIVE Sensitive     LEVOFLOXACIN 1 SENSITIVE Sensitive     * GROUP B STREP(S.AGALACTIAE)ISOLATED   Methicillin resistant staphylococcus aureus - MIC*    CIPROFLOXACIN >=8 RESISTANT Resistant     ERYTHROMYCIN >=8 RESISTANT Resistant     GENTAMICIN <=0.5 SENSITIVE Sensitive     OXACILLIN >=4 RESISTANT Resistant      TETRACYCLINE >=16 RESISTANT Resistant     VANCOMYCIN 1 SENSITIVE Sensitive     TRIMETH/SULFA >=320 RESISTANT Resistant     CLINDAMYCIN >=8 RESISTANT Resistant     RIFAMPIN <=0.5 SENSITIVE Sensitive     Inducible Clindamycin NEGATIVE Sensitive     * METHICILLIN RESISTANT STAPHYLOCOCCUS AUREUS  Urine Culture     Status: Abnormal   Collection Time: 09/02/20  5:25 PM   Specimen: Urine, Random  Result Value Ref Range Status   Specimen Description   Final    URINE, RANDOM Performed at [facility], [address] [place], [place] [address]    Special Requests   Final    NONE Performed at [facility], [address] [place], [place] [address]    Culture 70,000 COLONIES/mL PROTEUS MIRABILIS (A)  Final   Report Status 09/04/2020 FINAL  Final   Organism ID, Bacteria PROTEUS MIRABILIS (A)  Final      Susceptibility   Proteus mirabilis - MIC*    AMPICILLIN <=2 SENSITIVE Sensitive     CEFAZOLIN 8 SENSITIVE Sensitive     CEFEPIME <=0.12 SENSITIVE Sensitive     CEFTRIAXONE <=0.25 SENSITIVE Sensitive     CIPROFLOXACIN <=0.25 SENSITIVE Sensitive     GENTAMICIN <=1 SENSITIVE Sensitive     IMIPENEM 1 SENSITIVE Sensitive     NITROFURANTOIN 128 RESISTANT Resistant     TRIMETH/SULFA <=20 SENSITIVE Sensitive     AMPICILLIN/SULBACTAM <=2 SENSITIVE Sensitive     PIP/TAZO <=4 SENSITIVE Sensitive     * 70,000 COLONIES/mL PROTEUS MIRABILIS  Blood Culture ID Panel (Reflexed)     Status: Abnormal   Collection Time: 09/02/20  5:25 PM  Result Value Ref Range Status   Enterococcus faecalis NOT DETECTED NOT DETECTED Final   Enterococcus Faecium NOT DETECTED NOT DETECTED Final   Listeria monocytogenes NOT DETECTED NOT DETECTED Final   Staphylococcus species NOT DETECTED NOT DETECTED Final   Staphylococcus aureus (BCID) NOT DETECTED NOT DETECTED Final   Staphylococcus epidermidis NOT DETECTED NOT DETECTED Final   Staphylococcus lugdunensis NOT  DETECTED NOT DETECTED Final    Streptococcus species DETECTED (A) NOT DETECTED Final    Comment: CRITICAL RESULT CALLED TO, READ BACK BY AND VERIFIED WITH: JASON ROBBINS AT 0715 09/03/20 SDR    Streptococcus agalactiae DETECTED (A) NOT DETECTED Final    Comment: CRITICAL RESULT CALLED TO, READ BACK BY AND VERIFIED WITH:  JASON ROBBINS AT 0715 09/03/20 SDR    Streptococcus pneumoniae NOT DETECTED NOT DETECTED Final   Streptococcus pyogenes NOT DETECTED NOT DETECTED Final   A.calcoaceticus-baumannii NOT DETECTED NOT DETECTED Final   Bacteroides fragilis NOT DETECTED NOT DETECTED Final   Enterobacterales NOT DETECTED NOT DETECTED Final   Enterobacter cloacae complex NOT DETECTED NOT DETECTED Final   Escherichia coli NOT DETECTED NOT DETECTED Final   Klebsiella aerogenes NOT DETECTED NOT DETECTED Final   Klebsiella oxytoca NOT DETECTED NOT DETECTED Final   Klebsiella pneumoniae NOT DETECTED NOT DETECTED Final   Proteus species NOT DETECTED NOT DETECTED Final   Salmonella species NOT DETECTED NOT DETECTED Final   Serratia marcescens NOT DETECTED NOT DETECTED Final   Haemophilus influenzae NOT DETECTED NOT DETECTED Final   Neisseria meningitidis NOT DETECTED NOT DETECTED Final   Pseudomonas aeruginosa NOT DETECTED NOT DETECTED Final   Stenotrophomonas maltophilia NOT DETECTED NOT DETECTED Final   Candida albicans NOT DETECTED NOT DETECTED Final   Candida auris NOT DETECTED NOT DETECTED Final   Candida glabrata NOT DETECTED NOT DETECTED Final   Candida krusei NOT DETECTED NOT DETECTED Final   Candida parapsilosis NOT DETECTED NOT DETECTED Final   Candida tropicalis NOT DETECTED NOT DETECTED Final   Cryptococcus neoformans/gattii NOT DETECTED NOT DETECTED Final    Comment: Performed at [facility], [address] [place], [place] [address]  Culture, blood (Routine X 2) w Reflex to ID Panel     Status: None (Preliminary result)   Collection Time: 09/05/20  3:02 PM   Specimen: BLOOD  Result Value Ref Range  Status   Specimen Description BLOOD RIGHT ANTECUBITAL  Final   Special Requests   Final    BOTTLES DRAWN AEROBIC AND ANAEROBIC Blood Culture adequate volume   Culture   Final    NO GROWTH 2 DAYS Performed at [facility], [address] [place], [place] [address]    Report Status PENDING  Incomplete  Culture, blood (Routine X 2) w Reflex to ID Panel     Status: None (Preliminary result)   Collection Time: 09/05/20  3:04 PM   Specimen: BLOOD  Result Value Ref Range Status   Specimen Description BLOOD BLOOD LEFT HAND  Final   Special Requests   Final    BOTTLES DRAWN AEROBIC AND ANAEROBIC Blood Culture adequate volume   Culture   Final    NO GROWTH 2 DAYS Performed at [facility], [address] [place], [place] [address]    Report Status PENDING  Incomplete  MRSA PCR Screening     Status: Abnormal   Collection Time: 09/06/20 12:04 PM   Specimen: Nasopharyngeal  Result Value Ref Range Status   MRSA by PCR POSITIVE (A) NEGATIVE Final    Comment:        The GeneXpert MRSA Assay (FDA approved for NASAL specimens only), is one component of a comprehensive MRSA colonization surveillance program. It is not intended to diagnose MRSA infection nor to guide or monitor treatment for MRSA infections. RESULT CALLED TO, READ BACK BY AND VERIFIED WITH: KAYLA BRITTON AT 1330 ON 09/06/2020 BY SDR. [facility] Performed at [facility], [address] [place], [place] [address]      Studies: ECHOCARDIOGRAM COMPLETE  Result Date: 09/07/2020    ECHOCARDIOGRAM REPORT   Patient Name:   Becky Trevino Date of Exam: 09/07/2020 Medical Rec #:  [id number]       Height:       71.0 in Accession #:    [id number]      Weight:       280.0 lb Date of Birth:  1946-04-11       BSA:          2.433 m Patient Age:    77 years        BP:           108/65 mmHg Patient Gender: F               HR:           89 bpm. Exam Location:  [facility] Procedure: 2D Echo, Color Doppler and Cardiac  Doppler Indications:     R78.81 Bacteremia  History:         Patient has prior history of Echocardiogram examinations. CKD;                  Risk Factors:Sleep Apnea, Hypertension, Diabetes and                  Dyslipidemia.  Sonographer:     Charmayne Sheer RDCS (AE) Referring Phys:  [id number] Loletha Grayer Diagnosing Phys: Serafina Royals MD  Sonographer Comments: Suboptimal apical window and no subcostal window. IMPRESSIONS  1. Left ventricular ejection fraction, by estimation, is 60 to 65%. The left ventricle has normal function. The left ventricle has no regional wall motion abnormalities. Left ventricular diastolic parameters were normal.  2. Right ventricular systolic function is normal. The right ventricular size is normal.  3. The mitral valve is normal in structure. Trivial mitral valve regurgitation.  4. The aortic valve is normal in structure. Aortic valve regurgitation is not visualized. FINDINGS  Left Ventricle: Left ventricular ejection fraction, by estimation, is 60 to 65%. The left ventricle has normal function. The left ventricle has no regional wall motion abnormalities. The left ventricular internal cavity size was normal in size. There is  no left ventricular hypertrophy. Left ventricular diastolic parameters were normal. Right Ventricle: The right ventricular size is normal. No increase in right ventricular wall thickness. Right ventricular systolic function is normal. Left Atrium: Left atrial size was normal in size. Right Atrium: Right atrial size was normal in size. Pericardium: There is no evidence of pericardial effusion. Mitral Valve: The mitral valve is normal in structure. Trivial mitral valve regurgitation. MV peak gradient, 4.0 mmHg. The mean mitral valve gradient is 2.0 mmHg. Tricuspid Valve: The tricuspid valve is normal in structure. Tricuspid valve regurgitation is trivial. Aortic Valve: The aortic valve is normal in structure. Aortic valve regurgitation is not visualized. Aortic valve  mean gradient measures 4.0 mmHg. Aortic valve peak gradient measures 8.2 mmHg. Aortic valve area, by VTI measures 3.46 cm. Pulmonic Valve: The pulmonic valve was normal in structure. Pulmonic valve regurgitation is not visualized. Aorta: The aortic root and ascending aorta are structurally normal, with no evidence of dilitation. IAS/Shunts: No atrial level shunt detected by color flow Doppler.  LEFT VENTRICLE PLAX 2D LVIDd:         3.80 cm  Diastology LVIDs:         2.40 cm  LV e' medial:    5.55 cm/s  LV PW:         0.96 cm  LV E/e' medial:  11.1 LV IVS:        0.98 cm  LV e' lateral:   9.68 cm/s LVOT diam:     2.10 cm  LV E/e' lateral: 6.4 LV SV:         86 LV SV Index:   35 LVOT Area:     3.46 cm  LEFT ATRIUM           Index LA diam:      3.00 cm 1.23 cm/m LA Vol (A4C): 30.5 ml 12.54 ml/m  AORTIC VALVE                   PULMONIC VALVE AV Area (Vmax):    2.88 cm    PV Vmax:       1.27 m/s AV Area (Vmean):   3.11 cm    PV Vmean:      89.900 cm/s AV Area (VTI):     3.46 cm    PV VTI:        0.245 m AV Vmax:           143.00 cm/s PV Peak grad:  6.5 mmHg AV Vmean:          96.300 cm/s PV Mean grad:  4.0 mmHg AV VTI:            0.247 m AV Peak Grad:      8.2 mmHg AV Mean Grad:      4.0 mmHg LVOT Vmax:         119.00 cm/s LVOT Vmean:        86.500 cm/s LVOT VTI:          0.247 m LVOT/AV VTI ratio: 1.00  AORTA Ao Root diam: 3.40 cm MITRAL VALVE MV Area (PHT): 6.77 cm    SHUNTS MV Peak grad:  4.0 mmHg    Systemic VTI:  0.25 m MV Mean grad:  2.0 mmHg    Systemic Diam: 2.10 cm MV Vmax:       1.00 m/s MV Vmean:      74.2 cm/s MV Decel Time: 112 msec MV E velocity: 61.70 cm/s MV A velocity: 81.40 cm/s MV E/A ratio:  0.76 Serafina Royals MD Electronically signed by Serafina Royals MD Signature Date/Time: 09/07/2020/10:15:45 AM    Final     Scheduled Meds: . aspirin EC  81 mg Oral Daily  . atorvastatin  40 mg Oral QPM  . calcium-vitamin D  1 tablet Oral BID  . Chlorhexidine Gluconate Cloth  6 each Topical Q0600  .  enoxaparin (LOVENOX) injection  0.5 mg/kg Subcutaneous Q24H  . fluticasone  2 spray Each Nare Daily  . [START ON 09/08/2020] furosemide  20 mg Oral Daily  . imipramine  25 mg Oral QHS  . levothyroxine  25 mcg Oral Q0600  . magnesium oxide  400 mg Oral Daily  . metoprolol succinate  25 mg Oral Daily  . metoprolol tartrate  5 mg Intravenous Once  . midodrine  2.5 mg Oral TID WC  . mirabegron ER  50 mg Oral Daily  . mupirocin ointment  1 application Nasal BID   Continuous Infusions: . vancomycin      Assessment/Plan:  1. Sepsis, present on admission with strep agalactiae and left thigh cellulitis.  Patient also has MRSA in 1 blood culture.  As per infectious disease need to treat this as an infection.  Patient will be  on IV vancomycin through 09/24/2020.  Will need PICC line prior to disposition.  Repeat blood cultures negative for 48 hours. 2. SVT 2 nights ago on low-dose Toprol.  Echocardiogram shows a normal EF. 3. Relative hypotension increase midodrine to 5 mg 3 times daily 4. Acute kidney injury on chronic kidney disease stage IIIa.  Creatinine up today to 1.11.  Was as high as 1.52. 5. Lymphedema we will start low-dose Lasix tomorrow.  Continue working with PT and OT. 6. Weakness.  Physical therapy recommends rehab 7. Hypothyroidism unspecified started low-dose Synthroid. 8. Obesity with a BMI of 39.05 9. Hyperlipidemia unspecified on atorvastatin 10. Obstructive sleep apnea.  Unable to wear CPAP here.  She will ask her husband to bring in her CPAP    Code Status:     Code Status Orders  (From admission, onward)         Start     Ordered   09/02/20 2024  Full code  Continuous        09/02/20 2025        Code Status History    Date Active Date Inactive Code Status Order ID Comments User Context   10/28/2019 0506 11/07/2019 2326 Full Code [id number]  Sidney Ace Arvella Merles, MD Inpatient   08/07/2019 1604 08/09/2019 1856 Full Code [id number]  Para Skeans, MD ED   08/07/2019 1316  08/07/2019 1604 Full Code [id number]  Para Skeans, MD ED   11/19/2017 1141 11/22/2017 1429 Full Code [id number]  Hessie Knows, MD Inpatient   03/11/2016 1143 03/14/2016 2107 Full Code [id number]  Hessie Knows, MD Inpatient   Advance Care Planning Activity    Advance Directive Documentation   Flowsheet Row Most Recent Value  Type of Advance Directive Living will  Pre-existing out of facility DNR order (yellow form or pink MOST form) --  "MOST" Form in Place? --     Family Communication: Spoke with husband on the phone Disposition Plan: Status is: Inpatient  Dispo: The patient is from: Home              Anticipated d/c is to: Rehab              Anticipated d/c date is: As of now no rehab beds likely Monday the earliest.              Patient currently being treated for sepsis with streptococcal lactate and MRSA.  Antibiotics:  IV vancomycin  Time spent: 28 minutes  [place]

## 2020-09-07 NOTE — Progress Notes (Signed)
*  PRELIMINARY RESULTS* Echocardiogram 2D Echocardiogram has been performed.  Becky Trevino 09/07/2020, 9:09 AM

## 2020-09-07 NOTE — Progress Notes (Signed)
Pharmacy Antibiotic Note  Becky Trevino is a 74 y.o. female admitted on 09/02/2020 with group B strep bacteremia secondary to cellulitis. Micro called this morning to inform the team that staph aureus had grown on the plate from the 16/10 BCs, it did not register on the BCID and since it has been >24 hours no BCID available. While we wait for sensitivities on the staph, we will stop the penicillin and start vancomycin. I have also stopped her suppressive doxy, as vancomycin will be enough. 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    Per ID, pt will need Vancomycin for 2-3 weeks  Plan: Adjust Vancomycin from 1750 mg Q24h back to 1500 q24h (Scr 0.98 > 1.1)   Anticipated AUC of 513 (expected Css min 11.7) Will check renal function with AM labs.   Vanc peak/trough at steady state.   Height: 5\' 11"  (180.3 cm) Weight: 127 kg (280 lb) IBW/kg (Calculated) : 70.8  Temp (24hrs), Avg:98.4 F (36.9 C), Min:98 F (36.7 C), Max:98.6 F (37 C)  Recent Labs  Lab 09/02/20 1542 09/02/20 1725 09/03/20 0415 09/04/20 0406 09/05/20 0416 09/06/20 0416 09/06/20 0417 09/07/20 0510  WBC 10.4  --  7.3 7.1 6.2 6.4  --  7.2  CREATININE 1.52*  --  1.18* 1.19* 1.13*  --  0.98 1.10*  LATICACIDVEN 2.9* 2.8* 1.0  --   --   --   --   --     Estimated Creatinine Clearance: 66.1 mL/min (A) (by C-G formula based on SCr of 1.1 mg/dL (H)).    Allergies  Allergen Reactions  . Lisinopril Swelling    Antimicrobials this admission: 12/19 azithromycin x1 12/19 ceftriaxone >>12/20 12/20 doxycycline >>12/22 12/20 penicillin G >> 12/22 12/22 vancomycin >>   Microbiology results: 12/19 BCx: GBS, MRSA; vanc sensitive  12/19 UCx: proteus mirabilis   12/22 BCx: NGTD 12/23 MRSA PCR: (+)  Thank you for allowing pharmacy to be a part of this patient's care.  Lu Duffel, PharmD, BCPS Clinical  Pharmacist 09/07/2020 7:40 AM

## 2020-09-08 DIAGNOSIS — A401 Sepsis due to streptococcus, group B: Secondary | ICD-10-CM | POA: Diagnosis not present

## 2020-09-08 DIAGNOSIS — E669 Obesity, unspecified: Secondary | ICD-10-CM | POA: Diagnosis not present

## 2020-09-08 DIAGNOSIS — G4733 Obstructive sleep apnea (adult) (pediatric): Secondary | ICD-10-CM

## 2020-09-08 DIAGNOSIS — N179 Acute kidney failure, unspecified: Secondary | ICD-10-CM | POA: Diagnosis not present

## 2020-09-08 DIAGNOSIS — I9589 Other hypotension: Secondary | ICD-10-CM | POA: Diagnosis not present

## 2020-09-08 LAB — CREATININE, SERUM
Creatinine, Ser: 1.17 mg/dL — ABNORMAL HIGH (ref 0.44–1.00)
GFR, Estimated: 49 mL/min — ABNORMAL LOW (ref >60)

## 2020-09-08 MED ORDER — HYDROCOD POLST-CPM POLST ER 10-8 MG/5ML PO SUER
5.0000 mL | Freq: Two times a day (BID) | ORAL | Status: DC | PRN
  Administered 2020-09-08 – 2020-09-13 (×5): 5 mL via ORAL
  Filled 2020-09-08 (×7): qty 5

## 2020-09-08 MED ORDER — POLYETHYLENE GLYCOL 3350 17 G PO PACK
17.0000 g | PACK | Freq: Every day | ORAL | Status: DC
  Administered 2020-09-08 – 2020-09-14 (×7): 17 g via ORAL
  Filled 2020-09-08 (×7): qty 1

## 2020-09-08 MED ORDER — TORSEMIDE 20 MG PO TABS
20.0000 mg | ORAL_TABLET | Freq: Every day | ORAL | Status: DC
  Administered 2020-09-09: 20 mg via ORAL
  Filled 2020-09-08: qty 1

## 2020-09-08 NOTE — Plan of Care (Signed)
  Problem: Education: Goal: Knowledge of General Education information will improve Description: Including pain rating scale, medication(s)/side effects and non-pharmacologic comfort measures 09/08/2020 1243 by Cristela Blue, RN Outcome: Progressing 09/08/2020 1243 by Cristela Blue, RN Outcome: Progressing   Problem: Health Behavior/Discharge Planning: Goal: Ability to manage health-related needs will improve 09/08/2020 1243 by Cristela Blue, RN Outcome: Progressing 09/08/2020 1243 by Cristela Blue, RN Outcome: Progressing   Problem: Clinical Measurements: Goal: Ability to maintain clinical measurements within normal limits will improve 09/08/2020 1243 by Cristela Blue, RN Outcome: Progressing 09/08/2020 1243 by Cristela Blue, RN Outcome: Progressing Goal: Will remain free from infection 09/08/2020 1243 by Cristela Blue, RN Outcome: Progressing 09/08/2020 1243 by Cristela Blue, RN Outcome: Progressing Goal: Diagnostic test results will improve 09/08/2020 1243 by Cristela Blue, RN Outcome: Progressing 09/08/2020 1243 by Cristela Blue, RN Outcome: Progressing Goal: Respiratory complications will improve 09/08/2020 1243 by Cristela Blue, RN Outcome: Progressing 09/08/2020 1243 by Cristela Blue, RN Outcome: Progressing Goal: Cardiovascular complication will be avoided 09/08/2020 1243 by Cristela Blue, RN Outcome: Progressing 09/08/2020 1243 by Cristela Blue, RN Outcome: Progressing   Problem: Activity: Goal: Risk for activity intolerance will decrease 09/08/2020 1243 by Cristela Blue, RN Outcome: Progressing 09/08/2020 1243 by Cristela Blue, RN Outcome: Progressing   Problem: Nutrition: Goal: Adequate nutrition will be maintained 09/08/2020 1243 by Cristela Blue, RN Outcome: Progressing 09/08/2020 1243 by Cristela Blue, RN Outcome: Progressing   Problem: Coping: Goal: Level of anxiety will decrease 09/08/2020 1243 by Cristela Blue, RN Outcome:  Progressing 09/08/2020 1243 by Cristela Blue, RN Outcome: Progressing   Problem: Elimination: Goal: Will not experience complications related to bowel motility 09/08/2020 1243 by Cristela Blue, RN Outcome: Progressing 09/08/2020 1243 by Cristela Blue, RN Outcome: Progressing Goal: Will not experience complications related to urinary retention 09/08/2020 1243 by Cristela Blue, RN Outcome: Progressing 09/08/2020 1243 by Cristela Blue, RN Outcome: Progressing   Problem: Pain Managment: Goal: General experience of comfort will improve 09/08/2020 1243 by Cristela Blue, RN Outcome: Progressing 09/08/2020 1243 by Cristela Blue, RN Outcome: Progressing   Problem: Safety: Goal: Ability to remain free from injury will improve 09/08/2020 1243 by Cristela Blue, RN Outcome: Progressing 09/08/2020 1243 by Cristela Blue, RN Outcome: Progressing   Problem: Skin Integrity: Goal: Risk for impaired skin integrity will decrease 09/08/2020 1243 by Cristela Blue, RN Outcome: Progressing 09/08/2020 1243 by Cristela Blue, RN Outcome: Progressing

## 2020-09-08 NOTE — Progress Notes (Signed)
Patient ID: Becky Trevino, female   DOB: [birth year], 74 y.o.   MRN: [id number] [facility] PROGRESS NOTE  SHERLIN SONIER WCB:[id number] DOB: Aug 12, 1946 DOA: 09/02/2020 PCP: [name], MD  HPI/Subjective: Patient still having some cough.  Also has not had a bowel movement.  Admitted initially with sepsis.  Otherwise feeling better than when she came in.  Objective: Vitals:   09/08/20 0729 09/08/20 1152  BP: 113/64 107/60  Pulse: 83 81  Resp: 18 20  Temp: 98.3 F (36.8 C) 98.5 F (36.9 C)  SpO2: 94% 94%    Intake/Output Summary (Last 24 hours) at 09/08/2020 1529 Last data filed at 09/08/2020 1452 Gross per 24 hour  Intake 300 ml  Output 900 ml  Net -600 ml   Filed Weights   09/02/20 1538  Weight: 127 kg    ROS: Review of Systems  Respiratory: Positive for cough. Negative for shortness of breath.   Cardiovascular: Negative for chest pain.  Gastrointestinal: Positive for constipation. Negative for abdominal pain, nausea and vomiting.   Exam: Physical Exam HENT:     Head: Normocephalic.     Mouth/Throat:     Pharynx: No oropharyngeal exudate.  Eyes:     General: Lids are normal.     Conjunctiva/sclera: Conjunctivae normal.     Pupils: Pupils are equal, round, and reactive to light.  Cardiovascular:     Rate and Rhythm: Normal rate and regular rhythm.     Heart sounds: Normal heart sounds, S1 normal and S2 normal.  Pulmonary:     Breath sounds: Examination of the right-lower field reveals decreased breath sounds. Examination of the left-lower field reveals decreased breath sounds. Decreased breath sounds present. No wheezing, rhonchi or rales.  Abdominal:     Palpations: Abdomen is soft.     Tenderness: There is no abdominal tenderness.  Musculoskeletal:     Right lower leg: No swelling.     Left lower leg: No swelling.  Skin:    General: Skin is warm.     Findings: No lesion.  Neurological:     Mental Status: She is alert and oriented to person,  place, and time.       Data Reviewed: Basic Metabolic Panel: Recent Labs  Lab 09/03/20 0415 09/04/20 0406 09/05/20 0416 09/06/20 0417 09/07/20 0510 09/08/20 0348  NA 136 136 136 137 136  --   K 3.6 3.6 4.1 3.9 3.9  --   CL 103 102 101 102 102  --   CO2 24 26 24 24 24   --   GLUCOSE 78 96 94 87 91  --   BUN 17 15 13 14 19   --   CREATININE 1.18* 1.19* 1.13* 0.98 1.10* 1.17*  CALCIUM 8.3* 8.7* 8.8* 8.9 8.9  --   MG  --   --   --  2.4  --   --    Liver Function Tests: Recent Labs  Lab 09/02/20 1542 09/03/20 0415 09/04/20 0406  AST 35 34 27  ALT 18 18 18   ALKPHOS 82 81 81  BILITOT 1.3* 1.4* 1.1  PROT 7.6 7.5 7.5  ALBUMIN 3.3* 3.1* 3.0*   CBC: Recent Labs  Lab 09/02/20 1542 09/03/20 0415 09/04/20 0406 09/05/20 0416 09/06/20 0416 09/07/20 0510  WBC 10.4 7.3 7.1 6.2 6.4 7.2  NEUTROABS 9.6* 6.5 5.7 4.4 4.6  --   HGB 11.0* 10.8* 10.7* 11.5* 11.8* 10.8*  HCT 33.9* 33.6* 32.8* 36.0 35.8* 33.7*  MCV 96.3 94.9 95.6 95.2 94.5 94.7  PLT 123* 111* 110* 120* 140* 158   BNP (last 3 results) Recent Labs    09/02/20 1542  BNP 224.4*     Recent Results (from the past 240 hour(s))  Resp Panel by RT-PCR (Flu A&B, Covid) Nasopharyngeal Swab     Status: None   Collection Time: 09/02/20  4:44 PM   Specimen: Nasopharyngeal Swab; Nasopharyngeal(NP) swabs in vial transport medium  Result Value Ref Range Status   SARS Coronavirus 2 by RT PCR NEGATIVE NEGATIVE Final    Comment: (NOTE) SARS-CoV-2 target nucleic acids are NOT DETECTED.  The SARS-CoV-2 RNA is generally detectable in upper respiratory specimens during the acute phase of infection. The lowest concentration of SARS-CoV-2 viral copies this assay can detect is 138 copies/mL. A negative result does not preclude SARS-Cov-2 infection and should not be used as the sole basis for treatment or other patient management decisions. A negative result may occur with  improper specimen collection/handling, submission of  specimen other than nasopharyngeal swab, presence of viral mutation(s) within the areas targeted by this assay, and inadequate number of viral copies(<138 copies/mL). A negative result must be combined with clinical observations, patient history, and epidemiological information. The expected result is Negative.  Fact Sheet for Patients:  BloggerCourse.comhttps://www.fda.gov/media/152166/download  Fact Sheet for Healthcare Providers:  SeriousBroker.ithttps://www.fda.gov/media/152162/download  This test is no t yet approved or cleared by the [place] FDA and  has been authorized for detection and/or diagnosis of SARS-CoV-2 by FDA under an Emergency Use Authorization (EUA). This EUA will remain  in effect (meaning this test can be used) for the duration of the COVID-19 declaration under Section 564(b)(1) of the Act, 21 U.S.C.section 360bbb-3(b)(1), unless the authorization is terminated  or revoked sooner.       Influenza A by PCR NEGATIVE NEGATIVE Final   Influenza B by PCR NEGATIVE NEGATIVE Final    Comment: (NOTE) The Xpert Xpress SARS-CoV-2/FLU/RSV plus assay is intended as an aid in the diagnosis of influenza from Nasopharyngeal swab specimens and should not be used as a sole basis for treatment. Nasal washings and aspirates are unacceptable for Xpert Xpress SARS-CoV-2/FLU/RSV testing.  Fact Sheet for Patients: BloggerCourse.comhttps://www.fda.gov/media/152166/download  Fact Sheet for Healthcare Providers: SeriousBroker.ithttps://www.fda.gov/media/152162/download  This test is not yet approved or cleared by the [place] FDA and has been authorized for detection and/or diagnosis of SARS-CoV-2 by FDA under an Emergency Use Authorization (EUA). This EUA will remain in effect (meaning this test can be used) for the duration of the COVID-19 declaration under Section 564(b)(1) of the Act, 21 U.S.C. section 360bbb-3(b)(1), unless the authorization is terminated or revoked.  Performed at [facility], [address] [place], [place] [id number]   Blood Culture ID Panel (Reflexed)     Status: Abnormal   Collection Time: 09/02/20  5:13 PM  Result Value Ref Range Status   Enterococcus faecalis NOT DETECTED NOT DETECTED Final   Enterococcus Faecium NOT DETECTED NOT DETECTED Final   Listeria monocytogenes NOT DETECTED NOT DETECTED Final   Staphylococcus species DETECTED (A) NOT DETECTED Final    Comment: CRITICAL RESULT CALLED TO, READ BACK BY AND VERIFIED WITH:  AMY THOMPSON AT 1004 09/03/20 SDR    Staphylococcus aureus (BCID) NOT DETECTED NOT DETECTED Final   Staphylococcus epidermidis DETECTED (A) NOT DETECTED Final    Comment: Methicillin (oxacillin) resistant coagulase negative staphylococcus. Possible blood culture contaminant (unless isolated from more than one blood culture draw or clinical case suggests pathogenicity). No antibiotic treatment is indicated for blood  culture  contaminants. CRITICAL RESULT CALLED TO, READ BACK BY AND VERIFIED WITH:  AMY THOMPSON AT 1004 09/03/20 SDR    Staphylococcus lugdunensis NOT DETECTED NOT DETECTED Final   Streptococcus species NOT DETECTED NOT DETECTED Final   Streptococcus agalactiae NOT DETECTED NOT DETECTED Final   Streptococcus pneumoniae NOT DETECTED NOT DETECTED Final   Streptococcus pyogenes NOT DETECTED NOT DETECTED Final   A.calcoaceticus-baumannii NOT DETECTED NOT DETECTED Final   Bacteroides fragilis NOT DETECTED NOT DETECTED Final   Enterobacterales NOT DETECTED NOT DETECTED Final   Enterobacter cloacae complex NOT DETECTED NOT DETECTED Final   Escherichia coli NOT DETECTED NOT DETECTED Final   Klebsiella aerogenes NOT DETECTED NOT DETECTED Final   Klebsiella oxytoca NOT DETECTED NOT DETECTED Final   Klebsiella pneumoniae NOT DETECTED NOT DETECTED Final   Proteus species NOT DETECTED NOT DETECTED Final   Salmonella species NOT DETECTED NOT DETECTED Final   Serratia marcescens NOT DETECTED NOT DETECTED Final   Haemophilus influenzae NOT  DETECTED NOT DETECTED Final   Neisseria meningitidis NOT DETECTED NOT DETECTED Final   Pseudomonas aeruginosa NOT DETECTED NOT DETECTED Final   Stenotrophomonas maltophilia NOT DETECTED NOT DETECTED Final   Candida albicans NOT DETECTED NOT DETECTED Final   Candida auris NOT DETECTED NOT DETECTED Final   Candida glabrata NOT DETECTED NOT DETECTED Final   Candida krusei NOT DETECTED NOT DETECTED Final   Candida parapsilosis NOT DETECTED NOT DETECTED Final   Candida tropicalis NOT DETECTED NOT DETECTED Final   Cryptococcus neoformans/gattii NOT DETECTED NOT DETECTED Final   Methicillin resistance mecA/C DETECTED (A) NOT DETECTED Final    Comment: CRITICAL RESULT CALLED TO, READ BACK BY AND VERIFIED WITH:  AMY THOMPSON AT 1004 09/03/20 SDR Performed at [facility], [address] [place], [place] [address]   Blood culture (routine x 2)     Status: Abnormal   Collection Time: 09/02/20  5:25 PM   Specimen: BLOOD  Result Value Ref Range Status   Specimen Description   Final    BLOOD BLOOD RIGHT HAND Performed at [facility], [address] [place], [place] [address]    Special Requests   Final    BOTTLES DRAWN AEROBIC AND ANAEROBIC Blood Culture results may not be optimal due to an inadequate volume of blood received in culture bottles Performed at [facility], [address] [place], [place] [address]    Culture  Setup Time   Final    Organism ID to follow GRAM POSITIVE COCCI IN CHAINS AEROBIC BOTTLE ONLY CRITICAL VALUE NOTED.  VALUE IS CONSISTENT WITH PREVIOUSLY REPORTED AND CALLED VALUE. GRAM POSITIVE COCCI ANAEROBIC BOTTLE ONLY CRITICAL RESULT CALLED TO, READ BACK BY AND VERIFIED WITH: AMY THOMPSON AT 1004 09/03/20 SDR Performed at [facility], [address] [place], [place] [address]    Culture (A)  Final    GROUP B STREP(S.AGALACTIAE)ISOLATED SUSCEPTIBILITIES PERFORMED ON PREVIOUS CULTURE WITHIN THE LAST 5 DAYS. STAPHYLOCOCCUS  HAEMOLYTICUS THE SIGNIFICANCE OF ISOLATING THIS ORGANISM FROM A SINGLE SET OF BLOOD CULTURES WHEN MULTIPLE SETS ARE DRAWN IS UNCERTAIN. PLEASE NOTIFY THE MICROBIOLOGY DEPARTMENT WITHIN ONE WEEK IF SPECIATION AND SENSITIVITIES ARE REQUIRED. Performed at [facility], [address] [place], [place] [address]    Report Status 09/06/2020 FINAL  Final  Blood culture (routine x 2)     Status: Abnormal   Collection Time: 09/02/20  5:25 PM   Specimen: BLOOD  Result Value Ref Range Status   Specimen Description   Final  BLOOD BLOOD LEFT HAND Performed at [facility], [address] [place], [place] [address]    Special Requests   Final    BOTTLES DRAWN AEROBIC AND ANAEROBIC Blood Culture results may not be optimal due to an inadequate volume of blood received in culture bottles Performed at [facility], [address] [place], [place] [address]    Culture  Setup Time   Final    Organism ID to follow GRAM POSITIVE COCCI IN CHAINS IN BOTH AEROBIC AND ANAEROBIC BOTTLES CRITICAL RESULT CALLED TO, READ BACK BY AND VERIFIED WITH: JASON ROBBINS AT 9937 09/03/20 SDR Performed at [facility], [address] [place], [place] [address]    Culture (A)  Final    GROUP B STREP(S.AGALACTIAE)ISOLATED METHICILLIN RESISTANT STAPHYLOCOCCUS AUREUS CRITICAL RESULT CALLED TO, READ BACK BY AND VERIFIED WITH: PHARMD THOMPSON, A. [id number] FCP Performed at [facility], [address] [place], [place] [address]    Report Status 09/06/2020 FINAL  Final   Organism ID, Bacteria GROUP B STREP(S.AGALACTIAE)ISOLATED  Final   Organism ID, Bacteria METHICILLIN RESISTANT STAPHYLOCOCCUS AUREUS  Final      Susceptibility   Group b strep(s.agalactiae)isolated - MIC*    CLINDAMYCIN >=1 RESISTANT Resistant     AMPICILLIN <=0.25 SENSITIVE Sensitive     ERYTHROMYCIN >=8 RESISTANT Resistant     VANCOMYCIN 0.5 SENSITIVE Sensitive     CEFTRIAXONE <=0.12 SENSITIVE Sensitive      LEVOFLOXACIN 1 SENSITIVE Sensitive     * GROUP B STREP(S.AGALACTIAE)ISOLATED   Methicillin resistant staphylococcus aureus - MIC*    CIPROFLOXACIN >=8 RESISTANT Resistant     ERYTHROMYCIN >=8 RESISTANT Resistant     GENTAMICIN <=0.5 SENSITIVE Sensitive     OXACILLIN >=4 RESISTANT Resistant     TETRACYCLINE >=16 RESISTANT Resistant     VANCOMYCIN 1 SENSITIVE Sensitive     TRIMETH/SULFA >=320 RESISTANT Resistant     CLINDAMYCIN >=8 RESISTANT Resistant     RIFAMPIN <=0.5 SENSITIVE Sensitive     Inducible Clindamycin NEGATIVE Sensitive     * METHICILLIN RESISTANT STAPHYLOCOCCUS AUREUS  Urine Culture     Status: Abnormal   Collection Time: 09/02/20  5:25 PM   Specimen: Urine, Random  Result Value Ref Range Status   Specimen Description   Final    URINE, RANDOM Performed at [facility], [address] [place], [place] [address]    Special Requests   Final    NONE Performed at [facility], [address] [place], [place] [address]    Culture 70,000 COLONIES/mL PROTEUS MIRABILIS (A)  Final   Report Status 09/04/2020 FINAL  Final   Organism ID, Bacteria PROTEUS MIRABILIS (A)  Final      Susceptibility   Proteus mirabilis - MIC*    AMPICILLIN <=2 SENSITIVE Sensitive     CEFAZOLIN 8 SENSITIVE Sensitive     CEFEPIME <=0.12 SENSITIVE Sensitive     CEFTRIAXONE <=0.25 SENSITIVE Sensitive     CIPROFLOXACIN <=0.25 SENSITIVE Sensitive     GENTAMICIN <=1 SENSITIVE Sensitive     IMIPENEM 1 SENSITIVE Sensitive     NITROFURANTOIN 128 RESISTANT Resistant     TRIMETH/SULFA <=20 SENSITIVE Sensitive     AMPICILLIN/SULBACTAM <=2 SENSITIVE Sensitive     PIP/TAZO <=4 SENSITIVE Sensitive     * 70,000 COLONIES/mL PROTEUS MIRABILIS  Blood Culture ID Panel (Reflexed)     Status: Abnormal   Collection Time: 09/02/20  5:25 PM  Result Value Ref Range Status  Enterococcus faecalis NOT DETECTED NOT DETECTED Final   Enterococcus Faecium NOT DETECTED NOT DETECTED Final   Listeria  monocytogenes NOT DETECTED NOT DETECTED Final   Staphylococcus species NOT DETECTED NOT DETECTED Final   Staphylococcus aureus (BCID) NOT DETECTED NOT DETECTED Final   Staphylococcus epidermidis NOT DETECTED NOT DETECTED Final   Staphylococcus lugdunensis NOT DETECTED NOT DETECTED Final   Streptococcus species DETECTED (A) NOT DETECTED Final    Comment: CRITICAL RESULT CALLED TO, READ BACK BY AND VERIFIED WITH: JASON ROBBINS AT 0715 09/03/20 SDR    Streptococcus agalactiae DETECTED (A) NOT DETECTED Final    Comment: CRITICAL RESULT CALLED TO, READ BACK BY AND VERIFIED WITH:  JASON ROBBINS AT 0715 09/03/20 SDR    Streptococcus pneumoniae NOT DETECTED NOT DETECTED Final   Streptococcus pyogenes NOT DETECTED NOT DETECTED Final   A.calcoaceticus-baumannii NOT DETECTED NOT DETECTED Final   Bacteroides fragilis NOT DETECTED NOT DETECTED Final   Enterobacterales NOT DETECTED NOT DETECTED Final   Enterobacter cloacae complex NOT DETECTED NOT DETECTED Final   Escherichia coli NOT DETECTED NOT DETECTED Final   Klebsiella aerogenes NOT DETECTED NOT DETECTED Final   Klebsiella oxytoca NOT DETECTED NOT DETECTED Final   Klebsiella pneumoniae NOT DETECTED NOT DETECTED Final   Proteus species NOT DETECTED NOT DETECTED Final   Salmonella species NOT DETECTED NOT DETECTED Final   Serratia marcescens NOT DETECTED NOT DETECTED Final   Haemophilus influenzae NOT DETECTED NOT DETECTED Final   Neisseria meningitidis NOT DETECTED NOT DETECTED Final   Pseudomonas aeruginosa NOT DETECTED NOT DETECTED Final   Stenotrophomonas maltophilia NOT DETECTED NOT DETECTED Final   Candida albicans NOT DETECTED NOT DETECTED Final   Candida auris NOT DETECTED NOT DETECTED Final   Candida glabrata NOT DETECTED NOT DETECTED Final   Candida krusei NOT DETECTED NOT DETECTED Final   Candida parapsilosis NOT DETECTED NOT DETECTED Final   Candida tropicalis NOT DETECTED NOT DETECTED Final   Cryptococcus neoformans/gattii NOT  DETECTED NOT DETECTED Final    Comment: Performed at [facility], [address] [place], [place] [address]  Culture, blood (Routine X 2) w Reflex to ID Panel     Status: None (Preliminary result)   Collection Time: 09/05/20  3:02 PM   Specimen: BLOOD  Result Value Ref Range Status   Specimen Description BLOOD RIGHT ANTECUBITAL  Final   Special Requests   Final    BOTTLES DRAWN AEROBIC AND ANAEROBIC Blood Culture adequate volume   Culture   Final    NO GROWTH 3 DAYS Performed at [facility], [address] [place], [place] [address]    Report Status PENDING  Incomplete  Culture, blood (Routine X 2) w Reflex to ID Panel     Status: None (Preliminary result)   Collection Time: 09/05/20  3:04 PM   Specimen: BLOOD  Result Value Ref Range Status   Specimen Description BLOOD BLOOD LEFT HAND  Final   Special Requests   Final    BOTTLES DRAWN AEROBIC AND ANAEROBIC Blood Culture adequate volume   Culture   Final    NO GROWTH 3 DAYS Performed at [facility], [address] [place], [place] [address]    Report Status PENDING  Incomplete  MRSA PCR Screening     Status: Abnormal   Collection Time: 09/06/20 12:04 PM   Specimen: Nasopharyngeal  Result Value Ref Range Status   MRSA by PCR POSITIVE (A) NEGATIVE Final    Comment:  The GeneXpert MRSA Assay (FDA approved for NASAL specimens only), is one component of a comprehensive MRSA colonization surveillance program. It is not intended to diagnose MRSA infection nor to guide or monitor treatment for MRSA infections. RESULT CALLED TO, READ BACK BY AND VERIFIED WITH: KAYLA BRITTON AT 1330 ON 09/06/2020 BY SDR. [facility] Performed at [facility], [address] [place], [place] [address]      Studies: ECHOCARDIOGRAM COMPLETE  Result Date: 09/07/2020    ECHOCARDIOGRAM REPORT   Patient Name:   TOINETTE BUCKALOO Date of Exam: 09/07/2020 Medical Rec #:  [id number]       Height:       71.0 in  Accession #:    [id number]      Weight:       280.0 lb Date of Birth:  07-May-1946       BSA:          2.433 m Patient Age:    85 years        BP:           108/65 mmHg Patient Gender: F               HR:           89 bpm. Exam Location:  [facility] Procedure: 2D Echo, Color Doppler and Cardiac Doppler Indications:     R78.81 Bacteremia  History:         Patient has prior history of Echocardiogram examinations. CKD;                  Risk Factors:Sleep Apnea, Hypertension, Diabetes and                  Dyslipidemia.  Sonographer:     Charmayne Sheer RDCS (AE) Referring Phys:  [id number] Loletha Grayer Diagnosing Phys: Serafina Royals MD  Sonographer Comments: Suboptimal apical window and no subcostal window. IMPRESSIONS  1. Left ventricular ejection fraction, by estimation, is 60 to 65%. The left ventricle has normal function. The left ventricle has no regional wall motion abnormalities. Left ventricular diastolic parameters were normal.  2. Right ventricular systolic function is normal. The right ventricular size is normal.  3. The mitral valve is normal in structure. Trivial mitral valve regurgitation.  4. The aortic valve is normal in structure. Aortic valve regurgitation is not visualized. FINDINGS  Left Ventricle: Left ventricular ejection fraction, by estimation, is 60 to 65%. The left ventricle has normal function. The left ventricle has no regional wall motion abnormalities. The left ventricular internal cavity size was normal in size. There is  no left ventricular hypertrophy. Left ventricular diastolic parameters were normal. Right Ventricle: The right ventricular size is normal. No increase in right ventricular wall thickness. Right ventricular systolic function is normal. Left Atrium: Left atrial size was normal in size. Right Atrium: Right atrial size was normal in size. Pericardium: There is no evidence of pericardial effusion. Mitral Valve: The mitral valve is normal in structure. Trivial mitral valve regurgitation.  MV peak gradient, 4.0 mmHg. The mean mitral valve gradient is 2.0 mmHg. Tricuspid Valve: The tricuspid valve is normal in structure. Tricuspid valve regurgitation is trivial. Aortic Valve: The aortic valve is normal in structure. Aortic valve regurgitation is not visualized. Aortic valve mean gradient measures 4.0 mmHg. Aortic valve peak gradient measures 8.2 mmHg. Aortic valve area, by VTI measures 3.46 cm. Pulmonic Valve: The pulmonic valve was normal in structure. Pulmonic valve regurgitation is not visualized. Aorta: The aortic root and  ascending aorta are structurally normal, with no evidence of dilitation. IAS/Shunts: No atrial level shunt detected by color flow Doppler.  LEFT VENTRICLE PLAX 2D LVIDd:         3.80 cm  Diastology LVIDs:         2.40 cm  LV e' medial:    5.55 cm/s LV PW:         0.96 cm  LV E/e' medial:  11.1 LV IVS:        0.98 cm  LV e' lateral:   9.68 cm/s LVOT diam:     2.10 cm  LV E/e' lateral: 6.4 LV SV:         86 LV SV Index:   35 LVOT Area:     3.46 cm  LEFT ATRIUM           Index LA diam:      3.00 cm 1.23 cm/m LA Vol (A4C): 30.5 ml 12.54 ml/m  AORTIC VALVE                   PULMONIC VALVE AV Area (Vmax):    2.88 cm    PV Vmax:       1.27 m/s AV Area (Vmean):   3.11 cm    PV Vmean:      89.900 cm/s AV Area (VTI):     3.46 cm    PV VTI:        0.245 m AV Vmax:           143.00 cm/s PV Peak grad:  6.5 mmHg AV Vmean:          96.300 cm/s PV Mean grad:  4.0 mmHg AV VTI:            0.247 m AV Peak Grad:      8.2 mmHg AV Mean Grad:      4.0 mmHg LVOT Vmax:         119.00 cm/s LVOT Vmean:        86.500 cm/s LVOT VTI:          0.247 m LVOT/AV VTI ratio: 1.00  AORTA Ao Root diam: 3.40 cm MITRAL VALVE MV Area (PHT): 6.77 cm    SHUNTS MV Peak grad:  4.0 mmHg    Systemic VTI:  0.25 m MV Mean grad:  2.0 mmHg    Systemic Diam: 2.10 cm MV Vmax:       1.00 m/s MV Vmean:      74.2 cm/s MV Decel Time: 112 msec MV E velocity: 61.70 cm/s MV A velocity: 81.40 cm/s MV E/A ratio:  0.76 Serafina Royals  MD Electronically signed by Serafina Royals MD Signature Date/Time: 09/07/2020/10:15:45 AM    Final     Scheduled Meds: . aspirin EC  81 mg Oral Daily  . atorvastatin  40 mg Oral QPM  . benzonatate  100 mg Oral TID  . calcium-vitamin D  1 tablet Oral BID  . Chlorhexidine Gluconate Cloth  6 each Topical Q0600  . enoxaparin (LOVENOX) injection  0.5 mg/kg Subcutaneous Q24H  . fluticasone  2 spray Each Nare Daily  . imipramine  25 mg Oral QHS  . levothyroxine  25 mcg Oral Q0600  . magnesium oxide  400 mg Oral Daily  . metoprolol succinate  25 mg Oral Daily  . metoprolol tartrate  5 mg Intravenous Once  . midodrine  5 mg Oral TID WC  . mirabegron ER  50 mg Oral Daily  . mupirocin ointment  1 application Nasal BID  . polyethylene glycol  17 g Oral Daily  . [START ON 09/09/2020] torsemide  20 mg Oral Daily   Continuous Infusions: . vancomycin Stopped (09/07/20 1818)    Assessment/Plan:  1. Sepsis, present on admission with strep agalactiae and left thigh cellulitis.  Patient also has MRSA in 1 blood culture.  Patient currently on IV vancomycin will be on this through 09/24/2020.  Will need PICC line prior to disposition.  Repeat blood cultures negative for 3 days. 2. SVT.  On low-dose Toprol.  Echocardiogram shows normal EF. 3. Relative hypotension.  I increase midodrine to 5 mg 3 times a day yesterday. 4. Lymphedema.  Patient states that she has been on torsemide in the past we will switch Lasix over to torsemide for tomorrow. 5. Acute kidney injury on chronic kidney disease stage IIIa.  Creatinine was 1.52 on 09/02/2020.  On 09/06/2020 creatinine was as low as 0.98.  Today's creatinine up at 1.17.  Continue to monitor on vancomycin and with diuresis. 6. Weakness.  Physical therapy still recommending rehab 7. Hypothyroidism unspecified on low-dose Synthroid 8. Obesity with a BMI of 39.05.  Will order daily weights 9. Obstructive sleep apnea.  Patient states she is unable to wear the CPAP  here.  Can use her own CPAP        Code Status:     Code Status Orders  (From admission, onward)         Start     Ordered   09/02/20 2024  Full code  Continuous        09/02/20 2025        Code Status History    Date Active Date Inactive Code Status Order ID Comments User Context   10/28/2019 0506 11/07/2019 2326 Full Code [id number]  Sidney Ace Arvella Merles, MD Inpatient   08/07/2019 1604 08/09/2019 1856 Full Code [id number]  Para Skeans, MD ED   08/07/2019 1316 08/07/2019 1604 Full Code [id number]  Para Skeans, MD ED   11/19/2017 1141 11/22/2017 1429 Full Code [id number]  Hessie Knows, MD Inpatient   03/11/2016 1143 03/14/2016 2107 Full Code [id number]  Hessie Knows, MD Inpatient   Advance Care Planning Activity    Advance Directive Documentation   Flowsheet Row Most Recent Value  Type of Advance Directive Living will  Pre-existing out of facility DNR order (yellow form or pink MOST form) --  "MOST" Form in Place? --     Family Communication: Spoke with husband on the phone Disposition Plan: Status is: Inpatient  Dispo: The patient is from: Home              Anticipated d/c is to: Rehab              Anticipated d/c date is: Earliest Monday, 09/10/2020              Patient currently on IV vancomycin for strep galactomannan MRSA sepsis.  Will need rehab.  Time spent: 28 minutes  [place]

## 2020-09-08 NOTE — Progress Notes (Signed)
RT at bedside to help patient with home cpap machine, RT opened bag and machine was not clean and very dirty. Pt said to put it back in the bag and her husband would come and clean it today . Rt will assess again tonight

## 2020-09-09 ENCOUNTER — Inpatient Hospital Stay: Payer: BC Managed Care – PPO

## 2020-09-09 DIAGNOSIS — I5033 Acute on chronic diastolic (congestive) heart failure: Secondary | ICD-10-CM | POA: Diagnosis not present

## 2020-09-09 DIAGNOSIS — I9589 Other hypotension: Secondary | ICD-10-CM | POA: Diagnosis not present

## 2020-09-09 DIAGNOSIS — N179 Acute kidney failure, unspecified: Secondary | ICD-10-CM | POA: Diagnosis not present

## 2020-09-09 DIAGNOSIS — A401 Sepsis due to streptococcus, group B: Secondary | ICD-10-CM | POA: Diagnosis not present

## 2020-09-09 LAB — BASIC METABOLIC PANEL
Anion gap: 8 (ref 5–15)
BUN: 21 mg/dL (ref 8–23)
CO2: 26 mmol/L (ref 22–32)
Calcium: 8.8 mg/dL — ABNORMAL LOW (ref 8.9–10.3)
Chloride: 103 mmol/L (ref 98–111)
Creatinine, Ser: 1.18 mg/dL — ABNORMAL HIGH (ref 0.44–1.00)
GFR, Estimated: 48 mL/min — ABNORMAL LOW (ref >60)
Glucose, Bld: 87 mg/dL (ref 70–99)
Potassium: 3.7 mmol/L (ref 3.5–5.1)
Sodium: 137 mmol/L (ref 135–145)

## 2020-09-09 LAB — CBC
HCT: 33.7 % — ABNORMAL LOW (ref 36.0–46.0)
Hemoglobin: 10.9 g/dL — ABNORMAL LOW (ref 12.0–15.0)
MCH: 30.6 pg (ref 26.0–34.0)
MCHC: 32.3 g/dL (ref 30.0–36.0)
MCV: 94.7 fL (ref 80.0–100.0)
Platelets: 196 10*3/uL (ref 150–400)
RBC: 3.56 MIL/uL — ABNORMAL LOW (ref 3.87–5.11)
RDW: 17.2 % — ABNORMAL HIGH (ref 11.5–15.5)
WBC: 5.9 10*3/uL (ref 4.0–10.5)
nRBC: 0.7 % — ABNORMAL HIGH (ref 0.0–0.2)

## 2020-09-09 MED ORDER — ENOXAPARIN SODIUM 80 MG/0.8ML ~~LOC~~ SOLN
0.5000 mg/kg | SUBCUTANEOUS | Status: DC
  Administered 2020-09-09: 62.5 mg via SUBCUTANEOUS
  Filled 2020-09-09 (×2): qty 0.8

## 2020-09-09 MED ORDER — TORSEMIDE 20 MG PO TABS
20.0000 mg | ORAL_TABLET | Freq: Two times a day (BID) | ORAL | Status: DC
  Administered 2020-09-09 – 2020-09-14 (×10): 20 mg via ORAL
  Filled 2020-09-09 (×10): qty 1

## 2020-09-09 MED ORDER — POTASSIUM CHLORIDE CRYS ER 20 MEQ PO TBCR
20.0000 meq | EXTENDED_RELEASE_TABLET | Freq: Two times a day (BID) | ORAL | Status: DC
  Administered 2020-09-09 – 2020-09-14 (×11): 20 meq via ORAL
  Filled 2020-09-09 (×11): qty 1

## 2020-09-09 MED ORDER — MIDODRINE HCL 5 MG PO TABS
10.0000 mg | ORAL_TABLET | Freq: Three times a day (TID) | ORAL | Status: DC
  Administered 2020-09-09 – 2020-09-14 (×12): 10 mg via ORAL
  Filled 2020-09-09 (×13): qty 2

## 2020-09-09 NOTE — Progress Notes (Signed)
Patient ID: Becky Trevino, female   DOB: 12-19-1945, 74 y.o.   MRN: [id number] [facility] PROGRESS NOTE  AMEILIA HIRAKAWA [id number] DOB: 06-11-1946 DOA: 09/02/2020 PCP: [name], MD  HPI/Subjective: Patient feels okay.  Still having some cough.  Left hip feeling a little bit better.  Still very weak.  Interested in going out to rehab.  Objective: Vitals:   09/09/20 0757 09/09/20 1129  BP: (!) 111/59 (!) 98/41  Pulse: 79 79  Resp: 20 20  Temp: 98.3 F (36.8 C) 97.8 F (36.6 C)  SpO2: 96% 98%    Intake/Output Summary (Last 24 hours) at 09/09/2020 1211 Last data filed at 09/09/2020 1133 Gross per 24 hour  Intake --  Output 1550 ml  Net -1550 ml   Filed Weights   09/02/20 1538  Weight: 127 kg    ROS: Review of Systems  Respiratory: Positive for cough and shortness of breath.   Cardiovascular: Negative for chest pain.  Gastrointestinal: Negative for abdominal pain, nausea and vomiting.   Exam: Physical Exam HENT:     Head: Normocephalic.     Mouth/Throat:     Pharynx: No oropharyngeal exudate.  Eyes:     General: Lids are normal.     Conjunctiva/sclera: Conjunctivae normal.     Pupils: Pupils are equal, round, and reactive to light.  Cardiovascular:     Rate and Rhythm: Normal rate and regular rhythm.     Heart sounds: Normal heart sounds, S1 normal and S2 normal.  Pulmonary:     Breath sounds: Examination of the right-lower field reveals decreased breath sounds. Examination of the left-lower field reveals decreased breath sounds. Decreased breath sounds present. No wheezing, rhonchi or rales.  Abdominal:     Palpations: Abdomen is soft.     Tenderness: There is no abdominal tenderness.  Musculoskeletal:     Right lower leg: Swelling present.     Left lower leg: Swelling present.  Skin:    General: Skin is warm.     Findings: No rash.  Neurological:     Mental Status: She is alert and oriented to person, place, and time.       Data  Reviewed: Basic Metabolic Panel: Recent Labs  Lab 09/04/20 0406 09/05/20 0416 09/06/20 0417 09/07/20 0510 09/08/20 0348 09/09/20 0408  NA 136 136 137 136  --  137  K 3.6 4.1 3.9 3.9  --  3.7  CL 102 101 102 102  --  103  CO2 26 24 24 24   --  26  GLUCOSE 96 94 87 91  --  87  BUN 15 13 14 19   --  21  CREATININE 1.19* 1.13* 0.98 1.10* 1.17* 1.18*  CALCIUM 8.7* 8.8* 8.9 8.9  --  8.8*  MG  --   --  2.4  --   --   --    Liver Function Tests: Recent Labs  Lab 09/02/20 1542 09/03/20 0415 09/04/20 0406  AST 35 34 27  ALT 18 18 18   ALKPHOS 82 81 81  BILITOT 1.3* 1.4* 1.1  PROT 7.6 7.5 7.5  ALBUMIN 3.3* 3.1* 3.0*   CBC: Recent Labs  Lab 09/02/20 1542 09/03/20 0415 09/04/20 0406 09/05/20 0416 09/06/20 0416 09/07/20 0510 09/09/20 0408  WBC 10.4 7.3 7.1 6.2 6.4 7.2 5.9  NEUTROABS 9.6* 6.5 5.7 4.4 4.6  --   --   HGB 11.0* 10.8* 10.7* 11.5* 11.8* 10.8* 10.9*  HCT 33.9* 33.6* 32.8* 36.0 35.8* 33.7* 33.7*  MCV 96.3  94.9 95.6 95.2 94.5 94.7 94.7  PLT 123* 111* 110* 120* 140* 158 196   BNP (last 3 results) Recent Labs    09/02/20 1542  BNP 224.4*      Recent Results (from the past 240 hour(s))  Resp Panel by RT-PCR (Flu A&B, Covid) Nasopharyngeal Swab     Status: None   Collection Time: 09/02/20  4:44 PM   Specimen: Nasopharyngeal Swab; Nasopharyngeal(NP) swabs in vial transport medium  Result Value Ref Range Status   SARS Coronavirus 2 by RT PCR NEGATIVE NEGATIVE Final    Comment: (NOTE) SARS-CoV-2 target nucleic acids are NOT DETECTED.  The SARS-CoV-2 RNA is generally detectable in upper respiratory specimens during the acute phase of infection. The lowest concentration of SARS-CoV-2 viral copies this assay can detect is 138 copies/mL. A negative result does not preclude SARS-Cov-2 infection and should not be used as the sole basis for treatment or other patient management decisions. A negative result may occur with  improper specimen collection/handling,  submission of specimen other than nasopharyngeal swab, presence of viral mutation(s) within the areas targeted by this assay, and inadequate number of viral copies(<138 copies/mL). A negative result must be combined with clinical observations, patient history, and epidemiological information. The expected result is Negative.  Fact Sheet for Patients:  EntrepreneurPulse.com.au  Fact Sheet for Healthcare Providers:  IncredibleEmployment.be  This test is no t yet approved or cleared by the [place] FDA and  has been authorized for detection and/or diagnosis of SARS-CoV-2 by FDA under an Emergency Use Authorization (EUA). This EUA will remain  in effect (meaning this test can be used) for the duration of the COVID-19 declaration under Section 564(b)(1) of the Act, 21 U.S.C.section 360bbb-3(b)(1), unless the authorization is terminated  or revoked sooner.       Influenza A by PCR NEGATIVE NEGATIVE Final   Influenza B by PCR NEGATIVE NEGATIVE Final    Comment: (NOTE) The Xpert Xpress SARS-CoV-2/FLU/RSV plus assay is intended as an aid in the diagnosis of influenza from Nasopharyngeal swab specimens and should not be used as a sole basis for treatment. Nasal washings and aspirates are unacceptable for Xpert Xpress SARS-CoV-2/FLU/RSV testing.  Fact Sheet for Patients: EntrepreneurPulse.com.au  Fact Sheet for Healthcare Providers: IncredibleEmployment.be  This test is not yet approved or cleared by the [place] FDA and has been authorized for detection and/or diagnosis of SARS-CoV-2 by FDA under an Emergency Use Authorization (EUA). This EUA will remain in effect (meaning this test can be used) for the duration of the COVID-19 declaration under Section 564(b)(1) of the Act, 21 U.S.C. section 360bbb-3(b)(1), unless the authorization is terminated or revoked.  Performed at [facility], [address] [place], [place] [address]   Blood Culture ID Panel (Reflexed)     Status: Abnormal   Collection Time: 09/02/20  5:13 PM  Result Value Ref Range Status   Enterococcus faecalis NOT DETECTED NOT DETECTED Final   Enterococcus Faecium NOT DETECTED NOT DETECTED Final   Listeria monocytogenes NOT DETECTED NOT DETECTED Final   Staphylococcus species DETECTED (A) NOT DETECTED Final    Comment: CRITICAL RESULT CALLED TO, READ BACK BY AND VERIFIED WITH:  AMY THOMPSON AT 1004 09/03/20 SDR    Staphylococcus aureus (BCID) NOT DETECTED NOT DETECTED Final   Staphylococcus epidermidis DETECTED (A) NOT DETECTED Final    Comment: Methicillin (oxacillin) resistant coagulase negative staphylococcus. Possible blood culture contaminant (unless isolated from more than one blood culture draw or clinical case suggests pathogenicity).  No antibiotic treatment is indicated for blood  culture contaminants. CRITICAL RESULT CALLED TO, READ BACK BY AND VERIFIED WITH:  AMY THOMPSON AT 1004 09/03/20 SDR    Staphylococcus lugdunensis NOT DETECTED NOT DETECTED Final   Streptococcus species NOT DETECTED NOT DETECTED Final   Streptococcus agalactiae NOT DETECTED NOT DETECTED Final   Streptococcus pneumoniae NOT DETECTED NOT DETECTED Final   Streptococcus pyogenes NOT DETECTED NOT DETECTED Final   A.calcoaceticus-baumannii NOT DETECTED NOT DETECTED Final   Bacteroides fragilis NOT DETECTED NOT DETECTED Final   Enterobacterales NOT DETECTED NOT DETECTED Final   Enterobacter cloacae complex NOT DETECTED NOT DETECTED Final   Escherichia coli NOT DETECTED NOT DETECTED Final   Klebsiella aerogenes NOT DETECTED NOT DETECTED Final   Klebsiella oxytoca NOT DETECTED NOT DETECTED Final   Klebsiella pneumoniae NOT DETECTED NOT DETECTED Final   Proteus species NOT DETECTED NOT DETECTED Final   Salmonella species NOT DETECTED NOT DETECTED Final   Serratia marcescens NOT DETECTED NOT DETECTED Final   Haemophilus  influenzae NOT DETECTED NOT DETECTED Final   Neisseria meningitidis NOT DETECTED NOT DETECTED Final   Pseudomonas aeruginosa NOT DETECTED NOT DETECTED Final   Stenotrophomonas maltophilia NOT DETECTED NOT DETECTED Final   Candida albicans NOT DETECTED NOT DETECTED Final   Candida auris NOT DETECTED NOT DETECTED Final   Candida glabrata NOT DETECTED NOT DETECTED Final   Candida krusei NOT DETECTED NOT DETECTED Final   Candida parapsilosis NOT DETECTED NOT DETECTED Final   Candida tropicalis NOT DETECTED NOT DETECTED Final   Cryptococcus neoformans/gattii NOT DETECTED NOT DETECTED Final   Methicillin resistance mecA/C DETECTED (A) NOT DETECTED Final    Comment: CRITICAL RESULT CALLED TO, READ BACK BY AND VERIFIED WITH:  AMY THOMPSON AT 1004 09/03/20 SDR Performed at [facility], [address] [place], [place] [address]   Blood culture (routine x 2)     Status: Abnormal   Collection Time: 09/02/20  5:25 PM   Specimen: BLOOD  Result Value Ref Range Status   Specimen Description   Final    BLOOD BLOOD RIGHT HAND Performed at [facility], [address] [place], [place] [address]    Special Requests   Final    BOTTLES DRAWN AEROBIC AND ANAEROBIC Blood Culture results may not be optimal due to an inadequate volume of blood received in culture bottles Performed at [facility], [address] [place], [place] [address]    Culture  Setup Time   Final    Organism ID to follow [place].  VALUE IS CONSISTENT WITH PREVIOUSLY REPORTED AND CALLED VALUE. GRAM POSITIVE COCCI ANAEROBIC BOTTLE ONLY CRITICAL RESULT CALLED TO, READ BACK BY AND VERIFIED WITH: AMY THOMPSON AT 1004 09/03/20 SDR Performed at [facility], [address] [place], [place] [address]    Culture (A)  Final    GROUP B STREP(S.AGALACTIAE)ISOLATED SUSCEPTIBILITIES PERFORMED ON PREVIOUS CULTURE WITHIN THE LAST 5  DAYS. STAPHYLOCOCCUS HAEMOLYTICUS THE SIGNIFICANCE OF ISOLATING THIS ORGANISM FROM A SINGLE SET OF BLOOD CULTURES WHEN MULTIPLE SETS ARE DRAWN IS UNCERTAIN. PLEASE NOTIFY THE MICROBIOLOGY DEPARTMENT WITHIN ONE WEEK IF SPECIATION AND SENSITIVITIES ARE REQUIRED. Performed at [facility], [place] [address], [place], [place] [address]    Report Status 09/06/2020 FINAL  Final  Blood culture (routine x 2)     Status: Abnormal   Collection Time: 09/02/20  5:25 PM   Specimen: BLOOD  Result Value Ref Range  Status   Specimen Description   Final    BLOOD BLOOD LEFT HAND Performed at [facility], [address] [place], [place] [address]    Special Requests   Final    BOTTLES DRAWN AEROBIC AND ANAEROBIC Blood Culture results may not be optimal due to an inadequate volume of blood received in culture bottles Performed at [facility], [address] [place], [place] [address]    Culture  Setup Time   Final    Organism ID to follow GRAM POSITIVE COCCI IN CHAINS IN BOTH AEROBIC AND ANAEROBIC BOTTLES CRITICAL RESULT CALLED TO, READ BACK BY AND VERIFIED WITH: JASON ROBBINS AT [id number] 09/03/20 [place] Performed at [facility], [address], [place], [place] [address]    Culture (A)  Final    GROUP B STREP(S.AGALACTIAE)ISOLATED METHICILLIN RESISTANT STAPHYLOCOCCUS AUREUS CRITICAL RESULT CALLED TO, READ BACK BY AND VERIFIED WITH: PHARMD THOMPSON, A. [id number] FCP Performed at [facility], [address] [address], [place],  [address]    Report Status 09/06/2020 FINAL  Final   Organism ID, Bacteria GROUP B STREP(S.AGALACTIAE)ISOLATED  Final   Organism ID, Bacteria METHICILLIN RESISTANT STAPHYLOCOCCUS AUREUS  Final      Susceptibility   Group b strep(s.agalactiae)isolated - MIC*    CLINDAMYCIN >=1 RESISTANT Resistant     AMPICILLIN <=0.25 SENSITIVE Sensitive     ERYTHROMYCIN >=8 RESISTANT Resistant     VANCOMYCIN 0.5 SENSITIVE Sensitive     CEFTRIAXONE <=0.12  SENSITIVE Sensitive     LEVOFLOXACIN 1 SENSITIVE Sensitive     * GROUP B STREP(S.AGALACTIAE)ISOLATED   Methicillin resistant staphylococcus aureus - MIC*    CIPROFLOXACIN >=8 RESISTANT Resistant     ERYTHROMYCIN >=8 RESISTANT Resistant     GENTAMICIN <=0.5 SENSITIVE Sensitive     OXACILLIN >=4 RESISTANT Resistant     TETRACYCLINE >=16 RESISTANT Resistant     VANCOMYCIN 1 SENSITIVE Sensitive     TRIMETH/SULFA >=320 RESISTANT Resistant     CLINDAMYCIN >=8 RESISTANT Resistant     RIFAMPIN <=0.5 SENSITIVE Sensitive     Inducible Clindamycin NEGATIVE Sensitive     * METHICILLIN RESISTANT STAPHYLOCOCCUS AUREUS  Urine Culture     Status: Abnormal   Collection Time: 09/02/20  5:25 PM   Specimen: Urine, Random  Result Value Ref Range Status   Specimen Description   Final    URINE, RANDOM Performed at [facility], [address], [place],  [address]    Special Requests   Final    NONE Performed at [facility], [address] [place], [place] [address]    Culture 70,000 COLONIES/mL PROTEUS MIRABILIS (A)  Final   Report Status 09/04/2020 FINAL  Final   Organism ID, Bacteria PROTEUS MIRABILIS (A)  Final      Susceptibility   Proteus mirabilis - MIC*    AMPICILLIN <=2 SENSITIVE Sensitive     CEFAZOLIN 8 SENSITIVE Sensitive     CEFEPIME <=0.12 SENSITIVE Sensitive     CEFTRIAXONE <=0.25 SENSITIVE Sensitive     CIPROFLOXACIN <=0.25 SENSITIVE Sensitive     GENTAMICIN <=1 SENSITIVE Sensitive     IMIPENEM 1 SENSITIVE Sensitive     NITROFURANTOIN 128 RESISTANT Resistant     TRIMETH/SULFA <=20 SENSITIVE Sensitive     AMPICILLIN/SULBACTAM <=2 SENSITIVE Sensitive     PIP/TAZO <=4 SENSITIVE Sensitive     * 70,000 COLONIES/mL PROTEUS MIRABILIS  Blood Culture ID Panel (Reflexed)     Status: Abnormal   Collection Time: 09/02/20  5:25 PM  Result Value Ref Range Status   Enterococcus faecalis NOT DETECTED NOT DETECTED Final   Enterococcus Faecium NOT DETECTED NOT  DETECTED Final   Listeria monocytogenes NOT DETECTED NOT DETECTED Final   Staphylococcus species NOT DETECTED NOT DETECTED Final   Staphylococcus aureus (BCID) NOT DETECTED NOT DETECTED Final   Staphylococcus epidermidis NOT DETECTED NOT DETECTED Final   Staphylococcus lugdunensis NOT DETECTED NOT DETECTED Final   Streptococcus species DETECTED (A) NOT DETECTED Final    Comment: CRITICAL RESULT CALLED TO, READ BACK BY AND VERIFIED WITH: JASON ROBBINS AT 0715 09/03/20 SDR    Streptococcus agalactiae DETECTED (A) NOT DETECTED Final    Comment: CRITICAL RESULT CALLED TO, READ BACK BY AND VERIFIED WITH:  JASON ROBBINS AT 0715 09/03/20 SDR    Streptococcus pneumoniae NOT DETECTED NOT DETECTED Final   Streptococcus pyogenes NOT DETECTED NOT DETECTED Final   A.calcoaceticus-baumannii NOT DETECTED NOT DETECTED Final   Bacteroides fragilis NOT DETECTED NOT DETECTED Final   Enterobacterales NOT DETECTED NOT DETECTED Final   Enterobacter cloacae complex NOT DETECTED NOT DETECTED Final   Escherichia coli NOT DETECTED NOT DETECTED Final   Klebsiella aerogenes NOT DETECTED NOT DETECTED Final   Klebsiella oxytoca NOT DETECTED NOT DETECTED Final   Klebsiella pneumoniae NOT DETECTED NOT DETECTED Final   Proteus species NOT DETECTED NOT DETECTED Final   Salmonella species NOT DETECTED NOT DETECTED Final   Serratia marcescens NOT DETECTED NOT DETECTED Final   Haemophilus influenzae NOT DETECTED NOT DETECTED Final   Neisseria meningitidis NOT DETECTED NOT DETECTED Final   Pseudomonas aeruginosa NOT DETECTED NOT DETECTED Final   Stenotrophomonas maltophilia NOT DETECTED NOT DETECTED Final   Candida albicans NOT DETECTED NOT DETECTED Final   Candida auris NOT DETECTED NOT DETECTED Final   Candida glabrata NOT DETECTED NOT DETECTED Final   Candida krusei NOT DETECTED NOT DETECTED Final   Candida parapsilosis NOT DETECTED NOT DETECTED Final   Candida tropicalis NOT DETECTED NOT DETECTED Final    Cryptococcus neoformans/gattii NOT DETECTED NOT DETECTED Final    Comment: Performed at [facility], [address] [place], [place] [address]  Culture, blood (Routine X 2) w Reflex to ID Panel     Status: None (Preliminary result)   Collection Time: 09/05/20  3:02 PM   Specimen: BLOOD  Result Value Ref Range Status   Specimen Description BLOOD RIGHT ANTECUBITAL  Final   Special Requests   Final    BOTTLES DRAWN AEROBIC AND ANAEROBIC Blood Culture adequate volume   Culture   Final    NO GROWTH 4 DAYS Performed at [facility], [address] [place], [place] [address]    Report Status PENDING  Incomplete  Culture, blood (Routine X 2) w Reflex to ID Panel     Status: None (Preliminary result)   Collection Time: 09/05/20  3:04 PM   Specimen: BLOOD  Result Value Ref Range Status   Specimen Description BLOOD BLOOD LEFT HAND  Final   Special Requests   Final    BOTTLES DRAWN AEROBIC AND ANAEROBIC Blood Culture adequate volume   Culture   Final    NO GROWTH 4 DAYS Performed at [facility], [address] [place],  [address]    Report Status PENDING  Incomplete  MRSA PCR Screening     Status: Abnormal   Collection Time: 09/06/20 12:04 PM   Specimen: Nasopharyngeal  Result Value Ref Range Status   MRSA by PCR POSITIVE (A) NEGATIVE Final  Comment:        The GeneXpert MRSA Assay (FDA approved for NASAL specimens only), is one component of a comprehensive MRSA colonization surveillance program. It is not intended to diagnose MRSA infection nor to guide or monitor treatment for MRSA infections. RESULT CALLED TO, READ BACK BY AND VERIFIED WITH: KAYLA BRITTON AT 1330 ON 09/06/2020 BY SDR. [facility] Performed at [facility], [address], [place], [place] [address]      Studies: [facility] Chest Port 1 View  Result Date: 09/09/2020 CLINICAL DATA:  Cough. EXAM: PORTABLE CHEST 1 VIEW COMPARISON:  09/02/2020 FINDINGS: Low volume film. The  cardio pericardial silhouette is enlarged. Vascular congestion with diffuse interstitial and left base airspace disease is similar to prior. No substantial pleural effusion. Telemetry leads overlie the chest. IMPRESSION: No substantial interval change in exam. Vascular congestion with diffuse interstitial and left base airspace disease. Electronically Signed   By: Misty Stanley M.D.   On: 09/09/2020 07:57    Scheduled Meds: . aspirin EC  81 mg Oral Daily  . atorvastatin  40 mg Oral QPM  . benzonatate  100 mg Oral TID  . calcium-vitamin D  1 tablet Oral BID  . Chlorhexidine Gluconate Cloth  6 each Topical Q0600  . enoxaparin (LOVENOX) injection  0.5 mg/kg Subcutaneous Q24H  . fluticasone  2 spray Each Nare Daily  . imipramine  25 mg Oral QHS  . levothyroxine  25 mcg Oral Q0600  . magnesium oxide  400 mg Oral Daily  . metoprolol succinate  25 mg Oral Daily  . metoprolol tartrate  5 mg Intravenous Once  . midodrine  5 mg Oral TID WC  . mirabegron ER  50 mg Oral Daily  . mupirocin ointment  1 application Nasal BID  . polyethylene glycol  17 g Oral Daily  . potassium chloride  20 mEq Oral BID  . torsemide  20 mg Oral BID   Continuous Infusions: . vancomycin 1,500 mg (09/08/20 1813)    Assessment/Plan:  1. Sepsis, present on admission with strep agalactiae and left thigh cellulitis.  Patient also has MRSA in 1 blood culture.  Patient on IV vancomycin through 09/24/2020.  We will get a PICC line once I know we have a place for her to go.  Repeat blood cultures negative.  Repeat chest x-ray showing possible pneumonia which was seen previously could be the group B strep. 2. Anasarca and lymphedema, acute on chronic diastolic congestive heart failure increase torsemide to twice a day dosing for right now.  Continue to monitor creatinine closely. 3. SVT on Toprol. 4. Relative hypotension on midodrine will increase to 10 mg 3 times daily 5. Acute kidney injury on chronic kidney disease stage  IIIa.  Creatinine 1.52 on presentation.  Today's creatinine 1.18.  Continue to monitor 6. Weakness.  Physical therapy still recommending rehab. 7. Hypothyroidism unspecified on low-dose Synthroid 8. Obesity with a BMI of 39.05 9. Obstructive sleep apnea     Code Status:     Code Status Orders  (From admission, onward)         Start     Ordered   09/02/20 2024  Full code  Continuous        09/02/20 2025        Code Status History    Date Active Date Inactive Code Status Order ID Comments User Context   10/28/2019 0506 11/07/2019 2326 Full Code [id number]  Christel Mormon, MD Inpatient   08/07/2019 [phone] 08/09/2019 1856  Full Code [id number]  Para Skeans, MD ED   08/07/2019 1316 08/07/2019 1604 Full Code [id number]  Para Skeans, MD ED   11/19/2017 1141 11/22/2017 1429 Full Code [id number]  Hessie Knows, MD Inpatient   03/11/2016 1143 03/14/2016 2107 Full Code [id number]  Hessie Knows, MD Inpatient   Advance Care Planning Activity    Advance Directive Documentation   Flowsheet Row Most Recent Value  Type of Advance Directive Living will  Pre-existing out of facility DNR order (yellow form or pink MOST form) --  "MOST" Form in Place? --     Family Communication: Left message for patient's husband Disposition Plan: Status is: Inpatient  Dispo: The patient is from: Home              Anticipated d/c is to: Rehab              Anticipated d/c date is: Once we obtain a rehab bed can potentially make things happen              Patient currently on IV vancomycin for sepsis  Consultants:  Infectious disease  Antibiotics:  Vancomycin  Time spent: 26 minutes  [place]

## 2020-09-10 DIAGNOSIS — R601 Generalized edema: Secondary | ICD-10-CM | POA: Diagnosis not present

## 2020-09-10 DIAGNOSIS — I471 Supraventricular tachycardia: Secondary | ICD-10-CM | POA: Diagnosis not present

## 2020-09-10 DIAGNOSIS — I959 Hypotension, unspecified: Secondary | ICD-10-CM

## 2020-09-10 DIAGNOSIS — J189 Pneumonia, unspecified organism: Secondary | ICD-10-CM

## 2020-09-10 DIAGNOSIS — A401 Sepsis due to streptococcus, group B: Secondary | ICD-10-CM | POA: Diagnosis not present

## 2020-09-10 DIAGNOSIS — G5720 Lesion of femoral nerve, unspecified lower limb: Secondary | ICD-10-CM

## 2020-09-10 LAB — VANCOMYCIN, TROUGH: Vancomycin Tr: 22 ug/mL (ref 15–20)

## 2020-09-10 LAB — BASIC METABOLIC PANEL
Anion gap: 8 (ref 5–15)
BUN: 23 mg/dL (ref 8–23)
CO2: 26 mmol/L (ref 22–32)
Calcium: 8.6 mg/dL — ABNORMAL LOW (ref 8.9–10.3)
Chloride: 102 mmol/L (ref 98–111)
Creatinine, Ser: 1.14 mg/dL — ABNORMAL HIGH (ref 0.44–1.00)
GFR, Estimated: 51 mL/min — ABNORMAL LOW (ref >60)
Glucose, Bld: 94 mg/dL (ref 70–99)
Potassium: 4.2 mmol/L (ref 3.5–5.1)
Sodium: 136 mmol/L (ref 135–145)

## 2020-09-10 LAB — CULTURE, BLOOD (ROUTINE X 2)
Culture: NO GROWTH
Culture: NO GROWTH
Special Requests: ADEQUATE
Special Requests: ADEQUATE

## 2020-09-10 LAB — MAGNESIUM: Magnesium: 2.3 mg/dL (ref 1.7–2.4)

## 2020-09-10 MED ORDER — ENOXAPARIN SODIUM 80 MG/0.8ML ~~LOC~~ SOLN
0.5000 mg/kg | SUBCUTANEOUS | Status: DC
  Administered 2020-09-10 – 2020-09-13 (×4): 75 mg via SUBCUTANEOUS
  Filled 2020-09-10 (×5): qty 0.8

## 2020-09-10 MED ORDER — VANCOMYCIN HCL 1250 MG/250ML IV SOLN
1250.0000 mg | INTRAVENOUS | Status: DC
  Administered 2020-09-10 – 2020-09-13 (×4): 1250 mg via INTRAVENOUS
  Filled 2020-09-10 (×4): qty 250

## 2020-09-10 NOTE — Progress Notes (Signed)
PHARMACIST - PHYSICIAN COMMUNICATION  CONCERNING:  Enoxaparin (Lovenox) for DVT Prophylaxis    RECOMMENDATION:  Filed Weights   09/02/20 1538 09/10/20 0500 09/10/20 0949  Weight: 127 kg (280 lb) (!) 150 kg (330 lb 11 oz) (!) 150.8 kg (332 lb 7.3 oz)    Body mass index is 46.37 kg/m.  Estimated Creatinine Clearance: 70.3 mL/min (A) (by C-G formula based on SCr of 1.14 mg/dL (H)).   Patients weight has been updated in Epic. Confirmed that weight is correct with RN.    Based on [facility] policy patient is a candidate for enoxaparin 0.5mg /kg TBW SQ every 24 hours based on BMI being >30.  DESCRIPTION: Pharmacy has adjusted enoxaparin dose per [facility] policy.  Patient is now receiving enoxaparin 75 mg every 24 hours   Gardner Candle, PharmD, BCPS Clinical Pharmacist 09/10/2020 11:48 AM

## 2020-09-10 NOTE — Progress Notes (Signed)
Pharmacy Antibiotic Note  Becky Trevino is a 74 y.o. female admitted on 09/02/2020 with group B strep bacteremia secondary to cellulitis. Micro called this morning to inform the team that staph aureus had grown on the plate from the 32/35 BCs, it did not register on the BCID and since it has been >24 hours no BCID available. While we wait for sensitivities on the staph, we will stop the penicillin and start vancomycin. I have also stopped her suppressive doxy, as vancomycin will be enough. 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    Per ID, pt will need Vancomycin for 2-3 weeks  Plan: Adjust Vancomycin from 1750 mg Q24h back to 1500 q24h (Scr 0.98 > 1.1)   Anticipated AUC of 513 (expected Css min 11.7) Will check renal function with AM labs.   Vanc peak/trough at steady state.  12/27:  Vanc trough @ 1617 = 22 mcg/mL Will decrease dose to Vancomycin 1250 mg IV Q24H to start on 12/27 @ ~ 1800.  Will recheck Vanc trough before 3rd new dose on 12/29.    Height: 5\' 11"  (180.3 cm) Weight: (!) 150.8 kg (332 lb 7.3 oz) IBW/kg (Calculated) : 70.8  Temp (24hrs), Avg:98.2 F (36.8 C), Min:97.6 F (36.4 C), Max:98.7 F (37.1 C)  Recent Labs  Lab 09/04/20 0406 09/05/20 0416 09/06/20 0416 09/06/20 0417 09/07/20 0510 09/08/20 0348 09/09/20 0408 09/10/20 0301 09/10/20 1617  WBC 7.1 6.2 6.4  --  7.2  --  5.9  --   --   CREATININE 1.19* 1.13*  --  0.98 1.10* 1.17* 1.18* 1.14*  --   VANCOTROUGH  --   --   --   --   --   --   --   --  22*    Estimated Creatinine Clearance: 70.3 mL/min (A) (by C-G formula based on SCr of 1.14 mg/dL (H)).    Allergies  Allergen Reactions  . Lisinopril Swelling    Antimicrobials this admission: 12/19 azithromycin x1 12/19 ceftriaxone >>12/20 12/20 doxycycline >>12/22 12/20 penicillin G >> 12/22 12/22 vancomycin >>   Microbiology results: 12/19  BCx: GBS, MRSA; vanc sensitive  12/19 UCx: proteus mirabilis   12/22 BCx: NGTD 12/23 MRSA PCR: (+)  Thank you for allowing pharmacy to be a part of this patient's care.  1/24, PharmD, BCPS Clinical Pharmacist 09/10/2020 5:32 PM

## 2020-09-10 NOTE — Progress Notes (Signed)
Patient ID: Becky Trevino, female   DOB: [birth year], 74 y.o.   MRN: [id number] [facility] PROGRESS NOTE  SAYWARD HORVATH PYK:[id number] DOB: 1946/05/24 DOA: 09/02/2020 PCP: Alba Cory, MD  HPI/Subjective: Patient feeling a little bit better.  Still has a little bit of a cough but still little bit less.  Patient otherwise feeling better than when she came in.  Objective: Vitals:   09/10/20 0735 09/10/20 1120  BP: 104/60 126/75  Pulse: 76 78  Resp: 16 16  Temp:  98.6 F (37 C)  SpO2: 98% 99%    Intake/Output Summary (Last 24 hours) at 09/10/2020 1422 Last data filed at 09/10/2020 0600 Gross per 24 hour  Intake --  Output 3050 ml  Net -3050 ml   Filed Weights   09/02/20 1538 09/10/20 0500 09/10/20 0949  Weight: 127 kg (!) 150 kg (!) 150.8 kg    ROS: Review of Systems  Respiratory: Positive for cough and shortness of breath.   Cardiovascular: Negative for chest pain.  Gastrointestinal: Negative for abdominal pain, nausea and vomiting.   Exam: Physical Exam HENT:     Head: Normocephalic.     Mouth/Throat:     Pharynx: No oropharyngeal exudate.  Eyes:     General: Lids are normal.     Conjunctiva/sclera: Conjunctivae normal.     Pupils: Pupils are equal, round, and reactive to light.  Cardiovascular:     Rate and Rhythm: Normal rate and regular rhythm.     Heart sounds: Normal heart sounds, S1 normal and S2 normal.  Pulmonary:     Breath sounds: Examination of the right-lower field reveals decreased breath sounds. Examination of the left-lower field reveals decreased breath sounds. Decreased breath sounds present. No wheezing, rhonchi or rales.  Abdominal:     Palpations: Abdomen is soft.     Tenderness: There is no abdominal tenderness.  Musculoskeletal:     Right lower leg: Swelling present.     Left lower leg: Swelling present.  Skin:    General: Skin is warm.     Findings: No rash.  Neurological:     Mental Status: She is alert and oriented to  person, place, and time.       Data Reviewed: Basic Metabolic Panel: Recent Labs  Lab 09/05/20 0416 09/06/20 0417 09/07/20 0510 09/08/20 0348 09/09/20 0408 09/10/20 0301  NA 136 137 136  --  137 136  K 4.1 3.9 3.9  --  3.7 4.2  CL 101 102 102  --  103 102  CO2 24 24 24   --  26 26  GLUCOSE 94 87 91  --  87 94  BUN 13 14 19   --  21 23  CREATININE 1.13* 0.98 1.10* 1.17* 1.18* 1.14*  CALCIUM 8.8* 8.9 8.9  --  8.8* 8.6*  MG  --  2.4  --   --   --  2.3   Liver Function Tests: Recent Labs  Lab 09/04/20 0406  AST 27  ALT 18  ALKPHOS 81  BILITOT 1.1  PROT 7.5  ALBUMIN 3.0*   CBC: Recent Labs  Lab 09/04/20 0406 09/05/20 0416 09/06/20 0416 09/07/20 0510 09/09/20 0408  WBC 7.1 6.2 6.4 7.2 5.9  NEUTROABS 5.7 4.4 4.6  --   --   HGB 10.7* 11.5* 11.8* 10.8* 10.9*  HCT 32.8* 36.0 35.8* 33.7* 33.7*  MCV 95.6 95.2 94.5 94.7 94.7  PLT 110* 120* 140* 158 196   BNP (last 3 results) Recent Labs  09/02/20 1542  BNP 224.4*     Recent Results (from the past 240 hour(s))  Resp Panel by RT-PCR (Flu A&B, Covid) Nasopharyngeal Swab     Status: None   Collection Time: 09/02/20  4:44 PM   Specimen: Nasopharyngeal Swab; Nasopharyngeal(NP) swabs in vial transport medium  Result Value Ref Range Status   SARS Coronavirus 2 by RT PCR NEGATIVE NEGATIVE Final    Comment: (NOTE) SARS-CoV-2 target nucleic acids are NOT DETECTED.  The SARS-CoV-2 RNA is generally detectable in upper respiratory specimens during the acute phase of infection. The lowest concentration of SARS-CoV-2 viral copies this assay can detect is 138 copies/mL. A negative result does not preclude SARS-Cov-2 infection and should not be used as the sole basis for treatment or other patient management decisions. A negative result may occur with  improper specimen collection/handling, submission of specimen other than nasopharyngeal swab, presence of viral mutation(s) within the areas targeted by this assay, and  inadequate number of viral copies(<138 copies/mL). A negative result must be combined with clinical observations, patient history, and epidemiological information. The expected result is Negative.  Fact Sheet for Patients:  EntrepreneurPulse.com.au  Fact Sheet for Healthcare Providers:  IncredibleEmployment.be  This test is no t yet approved or cleared by the [place] FDA and  has been authorized for detection and/or diagnosis of SARS-CoV-2 by FDA under an Emergency Use Authorization (EUA). This EUA will remain  in effect (meaning this test can be used) for the duration of the COVID-19 declaration under Section 564(b)(1) of the Act, 21 U.S.C.section 360bbb-3(b)(1), unless the authorization is terminated  or revoked sooner.       Influenza A by PCR NEGATIVE NEGATIVE Final   Influenza B by PCR NEGATIVE NEGATIVE Final    Comment: (NOTE) The Xpert Xpress SARS-CoV-2/FLU/RSV plus assay is intended as an aid in the diagnosis of influenza from Nasopharyngeal swab specimens and should not be used as a sole basis for treatment. Nasal washings and aspirates are unacceptable for Xpert Xpress SARS-CoV-2/FLU/RSV testing.  Fact Sheet for Patients: EntrepreneurPulse.com.au  Fact Sheet for Healthcare Providers: IncredibleEmployment.be  This test is not yet approved or cleared by the [place] FDA and has been authorized for detection and/or diagnosis of SARS-CoV-2 by FDA under an Emergency Use Authorization (EUA). This EUA will remain in effect (meaning this test can be used) for the duration of the COVID-19 declaration under Section 564(b)(1) of the Act, 21 U.S.C. section 360bbb-3(b)(1), unless the authorization is terminated or revoked.  Performed at [facility], [address] [place], [place] [address]   Blood Culture ID Panel (Reflexed)     Status: Abnormal   Collection Time: 09/02/20  5:13  PM  Result Value Ref Range Status   Enterococcus faecalis NOT DETECTED NOT DETECTED Final   Enterococcus Faecium NOT DETECTED NOT DETECTED Final   Listeria monocytogenes NOT DETECTED NOT DETECTED Final   Staphylococcus species DETECTED (A) NOT DETECTED Final    Comment: CRITICAL RESULT CALLED TO, READ BACK BY AND VERIFIED WITH:  AMY THOMPSON AT 1004 09/03/20 SDR    Staphylococcus aureus (BCID) NOT DETECTED NOT DETECTED Final   Staphylococcus epidermidis DETECTED (A) NOT DETECTED Final    Comment: Methicillin (oxacillin) resistant coagulase negative staphylococcus. Possible blood culture contaminant (unless isolated from more than one blood culture draw or clinical case suggests pathogenicity). No antibiotic treatment is indicated for blood  culture contaminants. CRITICAL RESULT CALLED TO, READ BACK BY AND VERIFIED WITH:  AMY THOMPSON AT 1004 09/03/20 SDR  Staphylococcus lugdunensis NOT DETECTED NOT DETECTED Final   Streptococcus species NOT DETECTED NOT DETECTED Final   Streptococcus agalactiae NOT DETECTED NOT DETECTED Final   Streptococcus pneumoniae NOT DETECTED NOT DETECTED Final   Streptococcus pyogenes NOT DETECTED NOT DETECTED Final   A.calcoaceticus-baumannii NOT DETECTED NOT DETECTED Final   Bacteroides fragilis NOT DETECTED NOT DETECTED Final   Enterobacterales NOT DETECTED NOT DETECTED Final   Enterobacter cloacae complex NOT DETECTED NOT DETECTED Final   Escherichia coli NOT DETECTED NOT DETECTED Final   Klebsiella aerogenes NOT DETECTED NOT DETECTED Final   Klebsiella oxytoca NOT DETECTED NOT DETECTED Final   Klebsiella pneumoniae NOT DETECTED NOT DETECTED Final   Proteus species NOT DETECTED NOT DETECTED Final   Salmonella species NOT DETECTED NOT DETECTED Final   Serratia marcescens NOT DETECTED NOT DETECTED Final   Haemophilus influenzae NOT DETECTED NOT DETECTED Final   Neisseria meningitidis NOT DETECTED NOT DETECTED Final   Pseudomonas aeruginosa NOT DETECTED  NOT DETECTED Final   Stenotrophomonas maltophilia NOT DETECTED NOT DETECTED Final   Candida albicans NOT DETECTED NOT DETECTED Final   Candida auris NOT DETECTED NOT DETECTED Final   Candida glabrata NOT DETECTED NOT DETECTED Final   Candida krusei NOT DETECTED NOT DETECTED Final   Candida parapsilosis NOT DETECTED NOT DETECTED Final   Candida tropicalis NOT DETECTED NOT DETECTED Final   Cryptococcus neoformans/gattii NOT DETECTED NOT DETECTED Final   Methicillin resistance mecA/C DETECTED (A) NOT DETECTED Final    Comment: CRITICAL RESULT CALLED TO, READ BACK BY AND VERIFIED WITH:  AMY THOMPSON AT 1004 09/03/20 SDR Performed at [facility], [address] [place], [place] [address]   Blood culture (routine x 2)     Status: Abnormal   Collection Time: 09/02/20  5:25 PM   Specimen: BLOOD  Result Value Ref Range Status   Specimen Description   Final    BLOOD BLOOD RIGHT HAND Performed at [facility], [address] [place], [place] [address]    Special Requests   Final    BOTTLES DRAWN AEROBIC AND ANAEROBIC Blood Culture results may not be optimal due to an inadequate volume of blood received in culture bottles Performed at [facility], [address] [place], [place] [address]    Culture  Setup Time   Final    Organism ID to follow [place].  VALUE IS CONSISTENT WITH PREVIOUSLY REPORTED AND CALLED VALUE. GRAM POSITIVE COCCI ANAEROBIC BOTTLE ONLY CRITICAL RESULT CALLED TO, READ BACK BY AND VERIFIED WITH: AMY THOMPSON AT 1004 09/03/20 SDR Performed at [facility], [address] [place], [place] [address]    Culture (A)  Final    GROUP B STREP(S.AGALACTIAE)ISOLATED SUSCEPTIBILITIES PERFORMED ON PREVIOUS CULTURE WITHIN THE LAST 5 DAYS. STAPHYLOCOCCUS HAEMOLYTICUS THE SIGNIFICANCE OF ISOLATING THIS ORGANISM FROM A SINGLE SET OF BLOOD CULTURES WHEN MULTIPLE SETS ARE DRAWN IS  UNCERTAIN. PLEASE NOTIFY THE MICROBIOLOGY DEPARTMENT WITHIN ONE WEEK IF SPECIATION AND SENSITIVITIES ARE REQUIRED. Performed at [facility], [place] [address], [place], [place] [address]    Report Status 09/06/2020 FINAL  Final  Blood culture (routine x 2)     Status: Abnormal   Collection Time: 09/02/20  5:25 PM   Specimen: BLOOD  Result Value Ref Range Status   Specimen Description   Final    BLOOD BLOOD LEFT HAND Performed at [facility], [address] [place], [place] [address]  Special Requests   Final    BOTTLES DRAWN AEROBIC AND ANAEROBIC Blood Culture results may not be optimal due to an inadequate volume of blood received in culture bottles Performed at [facility], [address] [place], [place] [address]    Culture  Setup Time   Final    Organism ID to follow GRAM POSITIVE COCCI IN CHAINS IN BOTH AEROBIC AND ANAEROBIC BOTTLES CRITICAL RESULT CALLED TO, READ BACK BY AND VERIFIED WITH: JASON ROBBINS AT [id number] 09/03/20 [place] Performed at [facility], [address] [place], [place] [address]    Culture (A)  Final    GROUP B STREP(S.AGALACTIAE)ISOLATED METHICILLIN RESISTANT STAPHYLOCOCCUS AUREUS CRITICAL RESULT CALLED TO, READ BACK BY AND VERIFIED WITH: PHARMD THOMPSON, A. [id number] FCP Performed at [facility], [place] [address], [place], [place] [address]    Report Status 09/06/2020 FINAL  Final   Organism ID, Bacteria GROUP B STREP(S.AGALACTIAE)ISOLATED  Final   Organism ID, Bacteria METHICILLIN RESISTANT STAPHYLOCOCCUS AUREUS  Final      Susceptibility   Group b strep(s.agalactiae)isolated - MIC*    CLINDAMYCIN >=1 RESISTANT Resistant     AMPICILLIN <=0.25 SENSITIVE Sensitive     ERYTHROMYCIN >=8 RESISTANT Resistant     VANCOMYCIN 0.5 SENSITIVE Sensitive     CEFTRIAXONE <=0.12 SENSITIVE Sensitive     LEVOFLOXACIN 1 SENSITIVE Sensitive     * GROUP B STREP(S.AGALACTIAE)ISOLATED   Methicillin resistant staphylococcus  aureus - MIC*    CIPROFLOXACIN >=8 RESISTANT Resistant     ERYTHROMYCIN >=8 RESISTANT Resistant     GENTAMICIN <=0.5 SENSITIVE Sensitive     OXACILLIN >=4 RESISTANT Resistant     TETRACYCLINE >=16 RESISTANT Resistant     VANCOMYCIN 1 SENSITIVE Sensitive     TRIMETH/SULFA >=320 RESISTANT Resistant     CLINDAMYCIN >=8 RESISTANT Resistant     RIFAMPIN <=0.5 SENSITIVE Sensitive     Inducible Clindamycin NEGATIVE Sensitive     * METHICILLIN RESISTANT STAPHYLOCOCCUS AUREUS  Urine Culture     Status: Abnormal   Collection Time: 09/02/20  5:25 PM   Specimen: Urine, Random  Result Value Ref Range Status   Specimen Description   Final    URINE, RANDOM Performed at [facility], [address] [place], [place] [address]    Special Requests   Final    NONE Performed at [facility], [address] [place], [place] [address]    Culture 70,000 COLONIES/mL PROTEUS MIRABILIS (A)  Final   Report Status 09/04/2020 FINAL  Final   Organism ID, Bacteria PROTEUS MIRABILIS (A)  Final      Susceptibility   Proteus mirabilis - MIC*    AMPICILLIN <=2 SENSITIVE Sensitive     CEFAZOLIN 8 SENSITIVE Sensitive     CEFEPIME <=0.12 SENSITIVE Sensitive     CEFTRIAXONE <=0.25 SENSITIVE Sensitive     CIPROFLOXACIN <=0.25 SENSITIVE Sensitive     GENTAMICIN <=1 SENSITIVE Sensitive     IMIPENEM 1 SENSITIVE Sensitive     NITROFURANTOIN 128 RESISTANT Resistant     TRIMETH/SULFA <=20 SENSITIVE Sensitive     AMPICILLIN/SULBACTAM <=2 SENSITIVE Sensitive     PIP/TAZO <=4 SENSITIVE Sensitive     * 70,000 COLONIES/mL PROTEUS MIRABILIS  Blood Culture ID Panel (Reflexed)     Status: Abnormal   Collection Time: 09/02/20  5:25 PM  Result Value Ref Range Status   Enterococcus faecalis NOT DETECTED NOT DETECTED Final   Enterococcus Faecium NOT DETECTED NOT DETECTED Final   Listeria  monocytogenes NOT DETECTED NOT DETECTED Final   Staphylococcus species NOT DETECTED NOT DETECTED Final   Staphylococcus  aureus (BCID) NOT DETECTED NOT DETECTED Final   Staphylococcus epidermidis NOT DETECTED NOT DETECTED Final   Staphylococcus lugdunensis NOT DETECTED NOT DETECTED Final   Streptococcus species DETECTED (A) NOT DETECTED Final    Comment: CRITICAL RESULT CALLED TO, READ BACK BY AND VERIFIED WITH: JASON ROBBINS AT 0715 09/03/20 SDR    Streptococcus agalactiae DETECTED (A) NOT DETECTED Final    Comment: CRITICAL RESULT CALLED TO, READ BACK BY AND VERIFIED WITH:  JASON ROBBINS AT 0715 09/03/20 SDR    Streptococcus pneumoniae NOT DETECTED NOT DETECTED Final   Streptococcus pyogenes NOT DETECTED NOT DETECTED Final   A.calcoaceticus-baumannii NOT DETECTED NOT DETECTED Final   Bacteroides fragilis NOT DETECTED NOT DETECTED Final   Enterobacterales NOT DETECTED NOT DETECTED Final   Enterobacter cloacae complex NOT DETECTED NOT DETECTED Final   Escherichia coli NOT DETECTED NOT DETECTED Final   Klebsiella aerogenes NOT DETECTED NOT DETECTED Final   Klebsiella oxytoca NOT DETECTED NOT DETECTED Final   Klebsiella pneumoniae NOT DETECTED NOT DETECTED Final   Proteus species NOT DETECTED NOT DETECTED Final   Salmonella species NOT DETECTED NOT DETECTED Final   Serratia marcescens NOT DETECTED NOT DETECTED Final   Haemophilus influenzae NOT DETECTED NOT DETECTED Final   Neisseria meningitidis NOT DETECTED NOT DETECTED Final   Pseudomonas aeruginosa NOT DETECTED NOT DETECTED Final   Stenotrophomonas maltophilia NOT DETECTED NOT DETECTED Final   Candida albicans NOT DETECTED NOT DETECTED Final   Candida auris NOT DETECTED NOT DETECTED Final   Candida glabrata NOT DETECTED NOT DETECTED Final   Candida krusei NOT DETECTED NOT DETECTED Final   Candida parapsilosis NOT DETECTED NOT DETECTED Final   Candida tropicalis NOT DETECTED NOT DETECTED Final   Cryptococcus neoformans/gattii NOT DETECTED NOT DETECTED Final    Comment: Performed at [facility], [address] [place], [place] [address]   Culture, blood (Routine X 2) w Reflex to ID Panel     Status: None   Collection Time: 09/05/20  3:02 PM   Specimen: BLOOD  Result Value Ref Range Status   Specimen Description BLOOD RIGHT ANTECUBITAL  Final   Special Requests   Final    BOTTLES DRAWN AEROBIC AND ANAEROBIC Blood Culture adequate volume   Culture   Final    NO GROWTH 5 DAYS Performed at [facility], [address] [place], [place] [address]    Report Status 09/10/2020 FINAL  Final  Culture, blood (Routine X 2) w Reflex to ID Panel     Status: None   Collection Time: 09/05/20  3:04 PM   Specimen: BLOOD  Result Value Ref Range Status   Specimen Description BLOOD BLOOD LEFT HAND  Final   Special Requests   Final    BOTTLES DRAWN AEROBIC AND ANAEROBIC Blood Culture adequate volume   Culture   Final    NO GROWTH 5 DAYS Performed at [facility], [address] [place], [place] [address]    Report Status 09/10/2020 FINAL  Final  MRSA PCR Screening     Status: Abnormal   Collection Time: 09/06/20 12:04 PM   Specimen: Nasopharyngeal  Result Value Ref Range Status   MRSA by PCR POSITIVE (A) NEGATIVE Final    Comment:        The GeneXpert MRSA Assay (FDA approved for NASAL specimens only), is one component of a comprehensive MRSA colonization surveillance program. It  is not intended to diagnose MRSA infection nor to guide or monitor treatment for MRSA infections. RESULT CALLED TO, READ BACK BY AND VERIFIED WITH: KAYLA BRITTON AT 1330 ON 09/06/2020 BY SDR. [facility] Performed at [facility], [address], [place], [place] [address]      Studies: [facility] Chest Port 1 View  Result Date: 09/09/2020 CLINICAL DATA:  Cough. EXAM: PORTABLE CHEST 1 VIEW COMPARISON:  09/02/2020 FINDINGS: Low volume film. The cardio pericardial silhouette is enlarged. Vascular congestion with diffuse interstitial and left base airspace disease is similar to prior. No substantial pleural effusion. Telemetry leads  overlie the chest. IMPRESSION: No substantial interval change in exam. Vascular congestion with diffuse interstitial and left base airspace disease. Electronically Signed   By: Misty Stanley M.D.   On: 09/09/2020 07:57    Scheduled Meds: . aspirin EC  81 mg Oral Daily  . atorvastatin  40 mg Oral QPM  . benzonatate  100 mg Oral TID  . calcium-vitamin D  1 tablet Oral BID  . Chlorhexidine Gluconate Cloth  6 each Topical Q0600  . enoxaparin (LOVENOX) injection  0.5 mg/kg Subcutaneous Q24H  . fluticasone  2 spray Each Nare Daily  . imipramine  25 mg Oral QHS  . levothyroxine  25 mcg Oral Q0600  . magnesium oxide  400 mg Oral Daily  . metoprolol succinate  25 mg Oral Daily  . metoprolol tartrate  5 mg Intravenous Once  . midodrine  10 mg Oral TID WC  . mirabegron ER  50 mg Oral Daily  . mupirocin ointment  1 application Nasal BID  . polyethylene glycol  17 g Oral Daily  . potassium chloride  20 mEq Oral BID  . torsemide  20 mg Oral BID   Continuous Infusions: . vancomycin 1,500 mg (09/09/20 1620)    Assessment/Plan:  1. Sepsis, present on admission with strep agalactiae and left thigh cellulitis.  Patient also had MRSA in 1 blood culture.  Repeat chest x-ray shows possible pneumonia which could be group B strep or the MRSA.  On IV vancomycin.  Case discussed with infectious disease specialist and hold off on PICC until we know she has a rehab bed and then place PICC line prior to disposition.  Patient will need IV vancomycin through 09/24/2020. 2. Lymphedema and anasarca, acute on chronic diastolic congestive heart failure.  Continue torsemide 20 mg twice a day.  Continue to monitor creatinine closely. 3. SVT on Toprol 4. Relative hypotension on midodrine 5. Acute kidney injury on chronic kidney disease stage IIIa.  Creatinine 1.52 on presentation.  Creatinine was as low as 0.98 on 09/06/2020.  Currently at 1.14 today which is a little less than yesterday. 6. Hypothyroidism unspecified.   On low-dose Synthroid 7. Obesity with a BMI of 46.37.  Continue daily weights. 8. Obstructive sleep apnea.  Patient could not tolerate the CPAP here in the hospital.  Has to switch out tubing for her CPAP at home. 9. Weakness.  Physical therapy recommending rehab.        Code Status:     Code Status Orders  (From admission, onward)         Start     Ordered   09/02/20 2024  Full code  Continuous        09/02/20 2025        Code Status History    Date Active Date Inactive Code Status Order ID Comments User Context   10/28/2019 0506 11/07/2019 2326 Full Code [id number]  Sidney Ace Arvella Merles, MD Inpatient   08/07/2019 1604 08/09/2019 1856 Full Code [id number]  Para Skeans, MD ED   08/07/2019 1316 08/07/2019 1604 Full Code [id number]  Para Skeans, MD ED   11/19/2017 1141 11/22/2017 1429 Full Code [id number]  Hessie Knows, MD Inpatient   03/11/2016 1143 03/14/2016 2107 Full Code [id number]  Hessie Knows, MD Inpatient   Advance Care Planning Activity    Advance Directive Documentation   Flowsheet Row Most Recent Value  Type of Advance Directive Living will  Pre-existing out of facility DNR order (yellow form or pink MOST form) --  "MOST" Form in Place? --     Family Communication: Updated husband on the phone Disposition Plan: Status is: Inpatient  Dispo: The patient is from: Home              Anticipated d/c is to: Rehab              Anticipated d/c date is: We will discharge out to rehab once rehab bed obtained.  Will end up needing PICC line before hand and setting up IV antibiotics through 09/24/2020              Patient currently needs a rehab bed.  Will receive IV vancomycin through 09/24/2020.  Consultants:  Infectious disease  Antibiotics:  IV vancomycin  Time spent: 28 minutes  [place]

## 2020-09-10 NOTE — Progress Notes (Signed)
Occupational Therapy Treatment Patient Details Name: Becky Trevino MRN: [id number] DOB: 11/10/1945 Today's Date: 09/10/2020    History of present illness 74 y.o. female with medical history significant of DDD/spondylosis of cervical spine, stage III chronic kidney disease, type II DM, diverticulosis, hyperlipidemia, hypertension, intent trigger, leg cramps, varicose veins of lower extremities, lymphedema, obesity, OSA, syncopal episode, chronic diastolic CHF who is coming to the emergency department via EMS 2 days in a row due to progressively worse chills, rigors, fever, frontal headache, lightheadedness, decreased appetite, fatigue and malaise, which she said started about a week ago.  The patient stated that she was having RLE pain due to sitting in her wheelchair.  She tried to get up, but her RLE was too weak to stand and fell.   OT comments  Pms [name] was seen for OT treatment on this date.  MIN A + single UE support seated grooming tasks - assist for posterior lean. MAX A x2 lateral scoot t/f. MAX A don B leg wraps seated EOB. Completed BLE/BUE exercises as described below. Pt assists with scooting higher in bed using B rails and overhead rails - required MOD A to stabilize BLE. Attempted STS Trevino MAX A + RW - pt was self-limiting in effort stating she requires 2 people. Noted difficulty with forward flexion to assist in lift off - pt reports abdominal discomfort and terminates trials. Pt making good progress toward goals. Pt continues to benefit from skilled OT services to maximize return to PLOF and minimize risk of future falls, injury, caregiver burden, and readmission. Will continue to follow POC. Discharge recommendation remains appropriate.    Follow Up Recommendations  SNF    Equipment Recommendations  Other (comment) (TBD)    Recommendations for Other Services      Precautions / Restrictions Precautions Precautions: Fall Restrictions Weight Bearing Restrictions: No        Mobility Bed Mobility Overal bed mobility: Needs Assistance Bed Mobility: Supine to Sit;Sit to Supine     Supine to sit: Mod assist Sit to supine: Mod assist   General bed mobility comments: assist for BLE mgmt.Pt assists with scooting higher in bed using B rails and overhead rails - required MOD A to stabilize BLE  Transfers Overall transfer level: Needs assistance Equipment used: Rolling walker (2 wheeled) Transfers: Sit to/from Stand Sit to Stand: Max assist;From elevated surface         General transfer comment: Attempted STS Trevino MAX A + RW - pt was self-limiting in effort stating she requires 2 people. Noted difficulty with forward flexion to assist in lift off - pt reports abdominal discomfort and terminates trials    Balance Overall balance assessment: Needs assistance Sitting-balance support: Feet supported Sitting balance-Leahy Scale: Fair Sitting balance - Comments: posterior lean, LLE slides forward                                   ADL either performed or assessed with clinical judgement   ADL Overall ADL's : Needs assistance/impaired                                       General ADL Comments: MIN A + single UE support seated grooming tasks - assist for posterior lean. MAX A x2 lateral scoot t/f. MAX A don B leg wraps seated EOB  Cognition Arousal/Alertness: Awake/alert Behavior During Therapy: WFL for tasks assessed/performed Overall Cognitive Status: Within Functional Limits for tasks assessed                                          Exercises Exercises: General Lower Extremity;Other exercises General Exercises - Lower Extremity Long Arc Quad: AROM;Strengthening;Both;20 reps;Seated Heel Slides: AROM;Strengthening;Both;20 reps;Seated Hip Flexion/Marching: AROM;Strengthening;Both;20 reps;Seated Heel Raises: AROM;Strengthening;Both;20 reps;Seated Other Exercises Other Exercises: Pt and  family educated re: d/Trevino recs, falls prevention, HEP Other Exercises: LBD, sup<>sit, lateral scoot, sitting balance/tolerance           Pertinent Vitals/ Pain       Pain Assessment: Faces Faces Pain Scale: Hurts a little bit Pain Location: "pain in my waist" - abdomen Pain Descriptors / Indicators: Discomfort;Constant Pain Intervention(s): Limited activity within patient's tolerance;Repositioned         Frequency  Min 2X/week        Progress Toward Goals  OT Goals(current goals can now be found in the care plan section)  Progress towards OT goals: Progressing toward goals  Acute Rehab OT Goals Patient Stated Goal: go to rehab, get better, go home OT Goal Formulation: With patient Time For Goal Achievement: 09/18/20 Potential to Achieve Goals: Fair ADL Goals Pt Will Perform Grooming: sitting;with set-up;with supervision Pt Will Transfer to Toilet: bedside commode;stand pivot transfer;with mod assist Pt Will Perform Toileting - Clothing Manipulation and hygiene: sit to/from stand;with adaptive equipment;with mod assist Additional ADL Goal #1: Pt will independently verbalize a plan to implement at least 3 learned falls prevention strategies into her daily routines/home environment for improved safety and functional independence upon hospital DC.  Plan Discharge plan remains appropriate;Frequency remains appropriate       AM-PAC OT "6 Clicks" Daily Activity     Outcome Measure   Help from another person eating meals?: A Little Help from another person taking care of personal grooming?: A Little Help from another person toileting, which includes using toliet, bedpan, or urinal?: A Lot Help from another person bathing (including washing, rinsing, drying)?: A Lot Help from another person to put on and taking off regular upper body clothing?: A Little Help from another person to put on and taking off regular lower body clothing?: A Lot 6 Click Score: 15    End of Session     OT Visit Diagnosis: Other abnormalities of gait and mobility (R26.89);Muscle weakness (generalized) (M62.81);Pain Pain - Right/Left:  (both) Pain - part of body:  (abdomen)   Activity Tolerance Patient tolerated treatment well   Patient Left in bed;with call bell/phone within reach;with family/visitor present   Nurse Communication          Time: 4008-6761 OT Time Calculation (min): 30 min  Charges: OT General Charges $OT Visit: 1 Visit OT Treatments $Self Care/Home Management : 8-22 mins $Therapeutic Exercise: 8-22 mins   Kathie Dike, M.S. OTR/L  09/10/20, 4:09 PM  ascom [phone]

## 2020-09-10 NOTE — TOC Progression Note (Addendum)
Transition of Care [facility]) - Progression Note    Patient Details  Name: MAYSIE PARKHILL MRN: [id number] Date of Birth: 09/22/1945  Transition of Care [facility]) CM/SW Contact  Liliana Cline, LCSW Phone Number: 09/10/2020, 8:44 AM  Clinical Narrative:   Per chart review, patient is only agreeable to rehab at [facility] or [facility]. [facility] declined due to insurance. CSW left a voicemail for [place] at [facility] asking her to review referral. Waiting to hear back.  9:30- CSW updated by MD that patient also agreeable to [facility]. Spoke to Sue Lush who reported they are out of network with [employer] as well.  Spoke with Verlon Au at [facility] who reported they can take patient but it would be about a $5,000 copay out of pocket. CSW will talk with patient about this.  11:10- CSW spoke to patient via phone about the above. She reported she cannot afford the copay for [facility].   She reported her sister just left [facility] and has the same health insurance as her. CSW attempted to call [facility] to follow up as in the hub they put they are out of network with patient's insurance. Left a voicemail for Tiffany requesting a return call.   1:00- Called Tiffany at [facility] again. She reported she thinks the issue was with billing with patient's insurance, but will double check and let CSW know if they find out they can take patient.  4:30- Attempted call to Tiffany at [facility] to follow up. Unable to reach Tiffany. Will try again in the morning. If [facility] unable to take patient, will have to talk to patient about extending bed search.   Expected Discharge Plan: Home w Home Health Services Barriers to Discharge: No Barriers Identified  Expected Discharge Plan and Services Expected Discharge Plan: Home w Home Health Services       Living arrangements for the past 2 months: Single Family Home                                        Social Determinants of Health (SDOH) Interventions    Readmission Risk Interventions No flowsheet data found.

## 2020-09-10 NOTE — Progress Notes (Signed)
Date of Admission:  09/02/2020   vancomycin   ID: Becky Trevino is a 74 y.o. female  Principal Problem:   Sepsis due to undetermined organism [facility]) Active Problems:   Benign essential HTN   Chronic kidney disease (CKD), stage III (moderate) (HCC)   Hyperlipidemia   OSA (obstructive sleep apnea)   Type 2 diabetes mellitus with hyperlipidemia (HCC)   Normocytic anemia   Thrombocytopenia (HCC)   Mild protein-calorie malnutrition (HCC)   Hyperbilirubinemia   Sepsis (HCC)   Sepsis due to Streptococcus agalactiae (HCC)   Acute kidney injury superimposed on CKD (HCC)   Chronic hypotension   Class 2 obesity without serious comorbidity with body mass index (BMI) of 39.0 to 39.9 in adult   SVT (supraventricular tachycardia) (HCC)   Weakness   Hypothyroidism   Acute on chronic diastolic CHF (congestive heart failure) (HCC)    Subjective: Says she is feeling better Cough much better Left thigh pain and swelling better She did some PT and stood at the side of bed  Medications:  . aspirin EC  81 mg Oral Daily  . atorvastatin  40 mg Oral QPM  . benzonatate  100 mg Oral TID  . calcium-vitamin D  1 tablet Oral BID  . Chlorhexidine Gluconate Cloth  6 each Topical Q0600  . enoxaparin (LOVENOX) injection  0.5 mg/kg Subcutaneous Q24H  . fluticasone  2 spray Each Nare Daily  . imipramine  25 mg Oral QHS  . levothyroxine  25 mcg Oral Q0600  . magnesium oxide  400 mg Oral Daily  . metoprolol succinate  25 mg Oral Daily  . metoprolol tartrate  5 mg Intravenous Once  . midodrine  10 mg Oral TID WC  . mirabegron ER  50 mg Oral Daily  . mupirocin ointment  1 application Nasal BID  . polyethylene glycol  17 g Oral Daily  . potassium chloride  20 mEq Oral BID  . torsemide  20 mg Oral BID    Objective: Vital signs in last 24 hours: Temp:  [97.8 F (36.6 C)-98.7 F (37.1 C)] 98.6 F (37 C) (12/27 1120) Pulse Rate:  [73-82] 78 (12/27 1120) Resp:  [16-20] 16 (12/27 1120) BP:  (98-126)/(41-75) 126/75 (12/27 1120) SpO2:  [93 %-99 %] 99 % (12/27 1120) Weight:  [150 kg-150.8 kg] 150.8 kg (12/27 0949)  PHYSICAL EXAM:  General: Alert, cooperative, no distress, appears stated age.  Head: Normocephalic, without obvious abnormality, atraumatic. Eyes: Conjunctivae clear, anicteric sclerae. Pupils are equal ENT Nares normal. No drainage or sinus tenderness. Lips, mucosa, and tongue normal. No Thrush Neck: Supple, symmetrical, no adenopathy, thyroid: non tender no carotid bruit and no JVD. Back: dry scaly skin. Lungs: Clear to auscultation bilaterally. No Wheezing or Rhonchi. No rales. Heart: Regular rate and rhythm, no murmur, rub or gallop. Abdomen: Soft, non-tender,not distended. Bowel sounds normal. No masses Extremities: edema of the legs -left > rt- erythema and induration of the left hip area much better Rt knee scar Skin: No rashes or lesions. Or bruising Lymph: Cervical, supraclavicular normal. Neurologic: Grossly non-focal  Lab Results Recent Labs    09/09/20 0408 09/10/20 0301  WBC 5.9  --   HGB 10.9*  --   HCT 33.7*  --   NA 137 136  K 3.7 4.2  CL 103 102  CO2 26 26  BUN 21 23  CREATININE 1.18* 1.14*   Liver Panel No results for input(s): PROT, ALBUMIN, AST, ALT, ALKPHOS, BILITOT, BILIDIR, IBILI in the last  72 hours. Sedimentation Rate No results for input(s): ESRSEDRATE in the last 72 hours. C-Reactive Protein No results for input(s): CRP in the last 72 hours.  Microbiology:  Studies/Results: DG Chest Port 1 View  Result Date: 09/09/2020 CLINICAL DATA:  Cough. EXAM: PORTABLE CHEST 1 VIEW COMPARISON:  09/02/2020 FINDINGS: Low volume film. The cardio pericardial silhouette is enlarged. Vascular congestion with diffuse interstitial and left base airspace disease is similar to prior. No substantial pleural effusion. Telemetry leads overlie the chest. IMPRESSION: No substantial interval change in exam. Vascular congestion with diffuse  interstitial and left base airspace disease. Electronically Signed   By: Misty Stanley M.D.   On: 09/09/2020 07:57     Assessment/Plan: Group B streptococcus bacteremia due to left upper thigh cellulitis.  Was on penicillin.  Because of MRSA  one of the 4 bottles penicillin has been changed to vancomycin.    The bioburden of MRSA is minimal.  1 out of 4 bottles.  There is no purulence noted in the cellulitic area.MRSA nares positive and also left lower lobe infiltrate on CXR could be due to MRSA Also has staph hemolyticus one and 4 bottles which is considered to be a contaminant.  Echo and repeat blood cultures pending We will give vancomycin for 3weeks.  Until 09/24/2020. While on IV vancomycin will need monitoring of Vanco trough and creatinine.   Severe lymphedema both legs will need compressive therapy.  Left femoral neuropathy  Right popliteal DVT status post filter  Severe obesity  Left THA- no septic arthritis  Right TKA, prosthetic joint infection with coag negative staph in 2017 was on suppressive doxy therapy after initial IV antibiotics  Discussed the management with patient and hospitalist

## 2020-09-11 ENCOUNTER — Inpatient Hospital Stay: Payer: Self-pay

## 2020-09-11 DIAGNOSIS — R7881 Bacteremia: Secondary | ICD-10-CM | POA: Diagnosis not present

## 2020-09-11 DIAGNOSIS — N179 Acute kidney failure, unspecified: Secondary | ICD-10-CM | POA: Diagnosis not present

## 2020-09-11 DIAGNOSIS — I5033 Acute on chronic diastolic (congestive) heart failure: Secondary | ICD-10-CM | POA: Diagnosis not present

## 2020-09-11 DIAGNOSIS — A401 Sepsis due to streptococcus, group B: Secondary | ICD-10-CM | POA: Diagnosis not present

## 2020-09-11 MED ORDER — SODIUM CHLORIDE 0.9 % IV SOLN
INTRAVENOUS | Status: DC | PRN
  Administered 2020-09-11: 500 mL via INTRAVENOUS

## 2020-09-11 NOTE — Progress Notes (Signed)
Physical Therapy Treatment Patient Details Name: Becky Trevino MRN: [id number] DOB: [birth year] Today's Date: 09/11/2020    History of Present Illness 74 y.o. female with medical history significant of DDD/spondylosis of cervical spine, stage III chronic kidney disease, type II DM, diverticulosis, hyperlipidemia, hypertension, intent trigger, leg cramps, varicose veins of lower extremities, lymphedema, obesity, OSA, syncopal episode, chronic diastolic CHF who is coming to the emergency department via EMS 2 days in a row due to progressively worse chills, rigors, fever, frontal headache, lightheadedness, decreased appetite, fatigue and malaise, which she said started about a week ago.  The patient stated that she was having RLE pain due to sitting in her wheelchair.  She tried to get up, but her RLE was too weak to stand and fell.    PT Comments    Significant functional improvement noted today. Pt stated she was in less discomfort and anxious to get moving. Pt completed B LE strengthening/circulation exercises in supine prior to OOB.  L LE weaker than R. Pt transferred supine to sitting at EOB with HOB raised and use of side rail (pt has hospital bed at home), MinA to to complete with good static sitting balance, feet on floor.  Bed raised and bariatric RW placed in front of pt. Pt able to transfer to standing with Mod/MinA, wide BOS, and vc's to facilitate upright posture.  Right sidestepping to bedside chair with CGA and good safety awareness. Pt reclined in chair, all needs met.  Nursing notified of pt's improved mobility status. Pt has made progress this session, however is not at baseline LOF and apparently with need continued antibiotics upon d/c, therefore SNF placement remains appropriate at this time   Follow Up Recommendations  SNF;Supervision/Assistance - 24 hour     Equipment Recommendations       Recommendations for Other Services       Precautions / Restrictions  Precautions Precautions: Fall Restrictions Weight Bearing Restrictions: No    Mobility  Bed Mobility Overal bed mobility: Needs Assistance Bed Mobility: Supine to Sit;Sit to Supine     Supine to sit: Min guard;HOB elevated        Transfers Overall transfer level: Needs assistance Equipment used: Rolling walker (2 wheeled) Transfers: Sit to/from Stand Sit to Stand: Min assist;From elevated surface         General transfer comment: Excellent progression with functional transfers  Ambulation/Gait                 Stairs             Wheelchair Mobility    Modified Rankin (Stroke Patients Only)       Balance                                            Cognition Arousal/Alertness: Awake/alert Behavior During Therapy: WFL for tasks assessed/performed Overall Cognitive Status: Within Functional Limits for tasks assessed                                 General Comments: Pt is A and O x 4. cooperative and pleasant.      Exercises General Exercises - Lower Extremity Long Arc Quad: AROM;Strengthening;Both;15 reps;Supine (consisting of AP, modified heel slides, hip IR/ER)    General Comments        Pertinent Vitals/Pain  Pain Assessment: No/denies pain    Home Living                      Prior Function            PT Goals (current goals can now be found in the care plan section) Acute Rehab PT Goals Patient Stated Goal: go to rehab, get better, go home Progress towards PT goals: Progressing toward goals    Frequency    Min 2X/week      PT Plan Current plan remains appropriate    Co-evaluation              AM-PAC PT "6 Clicks" Mobility   Outcome Measure  Help needed turning from your back to your side while in a flat bed without using bedrails?: A Lot Help needed moving from lying on your back to sitting on the side of a flat bed without using bedrails?: A Lot Help needed moving to  and from a bed to a chair (including a wheelchair)?: A Lot Help needed standing up from a chair using your arms (e.g., wheelchair or bedside chair)?: Total Help needed to walk in hospital room?: Total Help needed climbing 3-5 steps with a railing? : Total 6 Click Score: 9    End of Session Equipment Utilized During Treatment: Gait belt Activity Tolerance: Patient tolerated treatment well Patient left: in chair;with call bell/phone within reach;with chair alarm set Nurse Communication: Mobility status PT Visit Diagnosis: Muscle weakness (generalized) (M62.81);Difficulty in walking, not elsewhere classified (R26.2);Pain;History of falling (Z91.81)     Time: 1000-1042 PT Time Calculation (min) (ACUTE ONLY): 42 min  Charges:  $Gait Training: 8-22 mins $Therapeutic Activity: 23-37 mins                     Mikel Cella, PTA   [name] 09/11/2020, 10:50 AM

## 2020-09-11 NOTE — Progress Notes (Signed)
Patient ID: Becky Trevino, female   DOB: February 28, 1946, 74 y.o.   MRN: [id number] [facility] PROGRESS NOTE  Becky Trevino [id number] DOB: 1946/04/26 DOA: 09/02/2020 PCP: [name], MD  HPI/Subjective: Patient feels okay.  Still having some cough but better than it was a few days ago.  States that she is waiting to hear back from 1 more rehab facility or else will go home with home health.  Objective: Vitals:   09/11/20 0757 09/11/20 1233  BP: (!) 113/57 (!) 113/45  Pulse: 75 78  Resp: 14 16  Temp: 97.9 F (36.6 C) 97.7 F (36.5 C)  SpO2: 98% 99%    Intake/Output Summary (Last 24 hours) at 09/11/2020 1439 Last data filed at 09/11/2020 1409 Gross per 24 hour  Intake 250 ml  Output 6000 ml  Net -5750 ml   Filed Weights   09/02/20 1538 09/10/20 0500 09/10/20 0949  Weight: 127 kg (!) 150 kg (!) 150.8 kg    ROS: Review of Systems  Respiratory: Positive for cough. Negative for shortness of breath.   Cardiovascular: Negative for chest pain.  Gastrointestinal: Negative for abdominal pain, nausea and vomiting.   Exam: Physical Exam HENT:     Head: Normocephalic.     Mouth/Throat:     Pharynx: No oropharyngeal exudate.  Eyes:     General: Lids are normal.     Conjunctiva/sclera: Conjunctivae normal.     Pupils: Pupils are equal, round, and reactive to light.  Cardiovascular:     Rate and Rhythm: Normal rate and regular rhythm.     Heart sounds: Normal heart sounds, S1 normal and S2 normal.  Pulmonary:     Breath sounds: Examination of the right-lower field reveals decreased breath sounds. Examination of the left-lower field reveals decreased breath sounds. Decreased breath sounds present. No wheezing, rhonchi or rales.  Abdominal:     Palpations: Abdomen is soft.     Tenderness: There is no abdominal tenderness.  Musculoskeletal:     Right lower leg: Swelling present.     Left lower leg: Swelling present.  Skin:    General: Skin is warm.     Findings:  No rash.  Neurological:     Mental Status: She is alert and oriented to person, place, and time.       Data Reviewed: Basic Metabolic Panel: Recent Labs  Lab 09/05/20 0416 09/06/20 0417 09/07/20 0510 09/08/20 0348 09/09/20 0408 09/10/20 0301  NA 136 137 136  --  137 136  K 4.1 3.9 3.9  --  3.7 4.2  CL 101 102 102  --  103 102  CO2 24 24 24   --  26 26  GLUCOSE 94 87 91  --  87 94  BUN 13 14 19   --  21 23  CREATININE 1.13* 0.98 1.10* 1.17* 1.18* 1.14*  CALCIUM 8.8* 8.9 8.9  --  8.8* 8.6*  MG  --  2.4  --   --   --  2.3   CBC: Recent Labs  Lab 09/05/20 0416 09/06/20 0416 09/07/20 0510 09/09/20 0408  WBC 6.2 6.4 7.2 5.9  NEUTROABS 4.4 4.6  --   --   HGB 11.5* 11.8* 10.8* 10.9*  HCT 36.0 35.8* 33.7* 33.7*  MCV 95.2 94.5 94.7 94.7  PLT 120* 140* 158 196   BNP (last 3 results) Recent Labs    09/02/20 1542  BNP 224.4*      Recent Results (from the past 240 hour(s))  Resp Panel by  RT-PCR (Flu A&B, Covid) Nasopharyngeal Swab     Status: None   Collection Time: 09/02/20  4:44 PM   Specimen: Nasopharyngeal Swab; Nasopharyngeal(NP) swabs in vial transport medium  Result Value Ref Range Status   SARS Coronavirus 2 by RT PCR NEGATIVE NEGATIVE Final    Comment: (NOTE) SARS-CoV-2 target nucleic acids are NOT DETECTED.  The SARS-CoV-2 RNA is generally detectable in upper respiratory specimens during the acute phase of infection. The lowest concentration of SARS-CoV-2 viral copies this assay can detect is 138 copies/mL. A negative result does not preclude SARS-Cov-2 infection and should not be used as the sole basis for treatment or other patient management decisions. A negative result may occur with  improper specimen collection/handling, submission of specimen other than nasopharyngeal swab, presence of viral mutation(s) within the areas targeted by this assay, and inadequate number of viral copies(<138 copies/mL). A negative result must be combined with clinical  observations, patient history, and epidemiological information. The expected result is Negative.  Fact Sheet for Patients:  EntrepreneurPulse.com.au  Fact Sheet for Healthcare Providers:  IncredibleEmployment.be  This test is no t yet approved or cleared by the [place] FDA and  has been authorized for detection and/or diagnosis of SARS-CoV-2 by FDA under an Emergency Use Authorization (EUA). This EUA will remain  in effect (meaning this test can be used) for the duration of the COVID-19 declaration under Section 564(b)(1) of the Act, 21 U.S.C.section 360bbb-3(b)(1), unless the authorization is terminated  or revoked sooner.       Influenza A by PCR NEGATIVE NEGATIVE Final   Influenza B by PCR NEGATIVE NEGATIVE Final    Comment: (NOTE) The Xpert Xpress SARS-CoV-2/FLU/RSV plus assay is intended as an aid in the diagnosis of influenza from Nasopharyngeal swab specimens and should not be used as a sole basis for treatment. Nasal washings and aspirates are unacceptable for Xpert Xpress SARS-CoV-2/FLU/RSV testing.  Fact Sheet for Patients: EntrepreneurPulse.com.au  Fact Sheet for Healthcare Providers: IncredibleEmployment.be  This test is not yet approved or cleared by the [place] FDA and has been authorized for detection and/or diagnosis of SARS-CoV-2 by FDA under an Emergency Use Authorization (EUA). This EUA will remain in effect (meaning this test can be used) for the duration of the COVID-19 declaration under Section 564(b)(1) of the Act, 21 U.S.C. section 360bbb-3(b)(1), unless the authorization is terminated or revoked.  Performed at [facility], [address] [place], [place] [address]   Blood Culture ID Panel (Reflexed)     Status: Abnormal   Collection Time: 09/02/20  5:13 PM  Result Value Ref Range Status   Enterococcus faecalis NOT DETECTED NOT DETECTED Final    Enterococcus Faecium NOT DETECTED NOT DETECTED Final   Listeria monocytogenes NOT DETECTED NOT DETECTED Final   Staphylococcus species DETECTED (A) NOT DETECTED Final    Comment: CRITICAL RESULT CALLED TO, READ BACK BY AND VERIFIED WITH:  AMY THOMPSON AT 1004 09/03/20 SDR    Staphylococcus aureus (BCID) NOT DETECTED NOT DETECTED Final   Staphylococcus epidermidis DETECTED (A) NOT DETECTED Final    Comment: Methicillin (oxacillin) resistant coagulase negative staphylococcus. Possible blood culture contaminant (unless isolated from more than one blood culture draw or clinical case suggests pathogenicity). No antibiotic treatment is indicated for blood  culture contaminants. CRITICAL RESULT CALLED TO, READ BACK BY AND VERIFIED WITH:  AMY THOMPSON AT 1004 09/03/20 SDR    Staphylococcus lugdunensis NOT DETECTED NOT DETECTED Final   Streptococcus species NOT DETECTED NOT DETECTED Final  Streptococcus agalactiae NOT DETECTED NOT DETECTED Final   Streptococcus pneumoniae NOT DETECTED NOT DETECTED Final   Streptococcus pyogenes NOT DETECTED NOT DETECTED Final   A.calcoaceticus-baumannii NOT DETECTED NOT DETECTED Final   Bacteroides fragilis NOT DETECTED NOT DETECTED Final   Enterobacterales NOT DETECTED NOT DETECTED Final   Enterobacter cloacae complex NOT DETECTED NOT DETECTED Final   Escherichia coli NOT DETECTED NOT DETECTED Final   Klebsiella aerogenes NOT DETECTED NOT DETECTED Final   Klebsiella oxytoca NOT DETECTED NOT DETECTED Final   Klebsiella pneumoniae NOT DETECTED NOT DETECTED Final   Proteus species NOT DETECTED NOT DETECTED Final   Salmonella species NOT DETECTED NOT DETECTED Final   Serratia marcescens NOT DETECTED NOT DETECTED Final   Haemophilus influenzae NOT DETECTED NOT DETECTED Final   Neisseria meningitidis NOT DETECTED NOT DETECTED Final   Pseudomonas aeruginosa NOT DETECTED NOT DETECTED Final   Stenotrophomonas maltophilia NOT DETECTED NOT DETECTED Final   Candida  albicans NOT DETECTED NOT DETECTED Final   Candida auris NOT DETECTED NOT DETECTED Final   Candida glabrata NOT DETECTED NOT DETECTED Final   Candida krusei NOT DETECTED NOT DETECTED Final   Candida parapsilosis NOT DETECTED NOT DETECTED Final   Candida tropicalis NOT DETECTED NOT DETECTED Final   Cryptococcus neoformans/gattii NOT DETECTED NOT DETECTED Final   Methicillin resistance mecA/C DETECTED (A) NOT DETECTED Final    Comment: CRITICAL RESULT CALLED TO, READ BACK BY AND VERIFIED WITH:  AMY THOMPSON AT 1004 09/03/20 SDR Performed at [facility], [address] [place], [place] [address]   Blood culture (routine x 2)     Status: Abnormal   Collection Time: 09/02/20  5:25 PM   Specimen: BLOOD  Result Value Ref Range Status   Specimen Description   Final    BLOOD BLOOD RIGHT HAND Performed at [facility], [address] [place], [place] [address]    Special Requests   Final    BOTTLES DRAWN AEROBIC AND ANAEROBIC Blood Culture results may not be optimal due to an inadequate volume of blood received in culture bottles Performed at [facility], [address] [place], [place] [address]    Culture  Setup Time   Final    Organism ID to follow GRAM POSITIVE COCCI IN CHAINS AEROBIC BOTTLE ONLY CRITICAL VALUE NOTED.  VALUE IS CONSISTENT WITH PREVIOUSLY REPORTED AND CALLED VALUE. GRAM POSITIVE COCCI ANAEROBIC BOTTLE ONLY CRITICAL RESULT CALLED TO, READ BACK BY AND VERIFIED WITH: AMY THOMPSON AT 1004 09/03/20 SDR Performed at [facility], [address] [place], [place] [address]    Culture (A)  Final    GROUP B STREP(S.AGALACTIAE)ISOLATED SUSCEPTIBILITIES PERFORMED ON PREVIOUS CULTURE WITHIN THE LAST 5 DAYS. STAPHYLOCOCCUS HAEMOLYTICUS THE SIGNIFICANCE OF ISOLATING THIS ORGANISM FROM A SINGLE SET OF BLOOD CULTURES WHEN MULTIPLE SETS ARE DRAWN IS UNCERTAIN. PLEASE NOTIFY THE MICROBIOLOGY DEPARTMENT WITHIN ONE WEEK IF SPECIATION AND  SENSITIVITIES ARE REQUIRED. Performed at [facility], [address] [place], [place] [address]    Report Status 09/06/2020 FINAL  Final  Blood culture (routine x 2)     Status: Abnormal   Collection Time: 09/02/20  5:25 PM   Specimen: BLOOD  Result Value Ref Range Status   Specimen Description   Final    BLOOD BLOOD LEFT HAND Performed at [facility], [address] [place], [place] [address]    Special Requests   Final    BOTTLES DRAWN AEROBIC AND ANAEROBIC Blood Culture results may not  be optimal due to an inadequate volume of blood received in culture bottles Performed at [facility], [address] [place], [place] [address]    Culture  Setup Time   Final    Organism ID to follow GRAM POSITIVE COCCI IN CHAINS IN BOTH AEROBIC AND ANAEROBIC BOTTLES CRITICAL RESULT CALLED TO, READ BACK BY AND VERIFIED WITH: JASON ROBBINS AT 7408 09/03/20 SDR Performed at [facility], [address] [place], [place] [address]    Culture (A)  Final    GROUP B STREP(S.AGALACTIAE)ISOLATED METHICILLIN RESISTANT STAPHYLOCOCCUS AUREUS CRITICAL RESULT CALLED TO, READ BACK BY AND VERIFIED WITH: PHARMD THOMPSON, A. [id number] FCP Performed at [facility], [address] [place], [place] [address]    Report Status 09/06/2020 FINAL  Final   Organism ID, Bacteria GROUP B STREP(S.AGALACTIAE)ISOLATED  Final   Organism ID, Bacteria METHICILLIN RESISTANT STAPHYLOCOCCUS AUREUS  Final      Susceptibility   Group b strep(s.agalactiae)isolated - MIC*    CLINDAMYCIN >=1 RESISTANT Resistant     AMPICILLIN <=0.25 SENSITIVE Sensitive     ERYTHROMYCIN >=8 RESISTANT Resistant     VANCOMYCIN 0.5 SENSITIVE Sensitive     CEFTRIAXONE <=0.12 SENSITIVE Sensitive     LEVOFLOXACIN 1 SENSITIVE Sensitive     * GROUP B STREP(S.AGALACTIAE)ISOLATED   Methicillin resistant staphylococcus aureus - MIC*    CIPROFLOXACIN >=8 RESISTANT Resistant     ERYTHROMYCIN >=8 RESISTANT  Resistant     GENTAMICIN <=0.5 SENSITIVE Sensitive     OXACILLIN >=4 RESISTANT Resistant     TETRACYCLINE >=16 RESISTANT Resistant     VANCOMYCIN 1 SENSITIVE Sensitive     TRIMETH/SULFA >=320 RESISTANT Resistant     CLINDAMYCIN >=8 RESISTANT Resistant     RIFAMPIN <=0.5 SENSITIVE Sensitive     Inducible Clindamycin NEGATIVE Sensitive     * METHICILLIN RESISTANT STAPHYLOCOCCUS AUREUS  Urine Culture     Status: Abnormal   Collection Time: 09/02/20  5:25 PM   Specimen: Urine, Random  Result Value Ref Range Status   Specimen Description   Final    URINE, RANDOM Performed at [facility], [address], [place], [place] [address]    Special Requests   Final    NONE Performed at [facility], [address] [place], [place] [address]    Culture 70,000 COLONIES/mL PROTEUS MIRABILIS (A)  Final   Report Status 09/04/2020 FINAL  Final   Organism ID, Bacteria PROTEUS MIRABILIS (A)  Final      Susceptibility   Proteus mirabilis - MIC*    AMPICILLIN <=2 SENSITIVE Sensitive     CEFAZOLIN 8 SENSITIVE Sensitive     CEFEPIME <=0.12 SENSITIVE Sensitive     CEFTRIAXONE <=0.25 SENSITIVE Sensitive     CIPROFLOXACIN <=0.25 SENSITIVE Sensitive     GENTAMICIN <=1 SENSITIVE Sensitive     IMIPENEM 1 SENSITIVE Sensitive     NITROFURANTOIN 128 RESISTANT Resistant     TRIMETH/SULFA <=20 SENSITIVE Sensitive     AMPICILLIN/SULBACTAM <=2 SENSITIVE Sensitive     PIP/TAZO <=4 SENSITIVE Sensitive     * 70,000 COLONIES/mL PROTEUS MIRABILIS  Blood Culture ID Panel (Reflexed)     Status: Abnormal   Collection Time: 09/02/20  5:25 PM  Result Value Ref Range Status   Enterococcus faecalis NOT DETECTED NOT DETECTED Final   Enterococcus Faecium NOT DETECTED NOT DETECTED Final   Listeria monocytogenes NOT DETECTED NOT DETECTED Final   Staphylococcus species NOT DETECTED NOT DETECTED Final   Staphylococcus  aureus (BCID) NOT DETECTED NOT DETECTED Final   Staphylococcus epidermidis NOT  DETECTED NOT DETECTED Final   Staphylococcus lugdunensis NOT DETECTED NOT DETECTED Final   Streptococcus species DETECTED (A) NOT DETECTED Final    Comment: CRITICAL RESULT CALLED TO, READ BACK BY AND VERIFIED WITH: JASON ROBBINS AT 0715 09/03/20 SDR    Streptococcus agalactiae DETECTED (A) NOT DETECTED Final    Comment: CRITICAL RESULT CALLED TO, READ BACK BY AND VERIFIED WITH:  JASON ROBBINS AT 0715 09/03/20 SDR    Streptococcus pneumoniae NOT DETECTED NOT DETECTED Final   Streptococcus pyogenes NOT DETECTED NOT DETECTED Final   A.calcoaceticus-baumannii NOT DETECTED NOT DETECTED Final   Bacteroides fragilis NOT DETECTED NOT DETECTED Final   Enterobacterales NOT DETECTED NOT DETECTED Final   Enterobacter cloacae complex NOT DETECTED NOT DETECTED Final   Escherichia coli NOT DETECTED NOT DETECTED Final   Klebsiella aerogenes NOT DETECTED NOT DETECTED Final   Klebsiella oxytoca NOT DETECTED NOT DETECTED Final   Klebsiella pneumoniae NOT DETECTED NOT DETECTED Final   Proteus species NOT DETECTED NOT DETECTED Final   Salmonella species NOT DETECTED NOT DETECTED Final   Serratia marcescens NOT DETECTED NOT DETECTED Final   Haemophilus influenzae NOT DETECTED NOT DETECTED Final   Neisseria meningitidis NOT DETECTED NOT DETECTED Final   Pseudomonas aeruginosa NOT DETECTED NOT DETECTED Final   Stenotrophomonas maltophilia NOT DETECTED NOT DETECTED Final   Candida albicans NOT DETECTED NOT DETECTED Final   Candida auris NOT DETECTED NOT DETECTED Final   Candida glabrata NOT DETECTED NOT DETECTED Final   Candida krusei NOT DETECTED NOT DETECTED Final   Candida parapsilosis NOT DETECTED NOT DETECTED Final   Candida tropicalis NOT DETECTED NOT DETECTED Final   Cryptococcus neoformans/gattii NOT DETECTED NOT DETECTED Final    Comment: Performed at [facility], [address] [place], [place] [address]  Culture, blood (Routine X 2) w Reflex to ID Panel     Status: None    Collection Time: 09/05/20  3:02 PM   Specimen: BLOOD  Result Value Ref Range Status   Specimen Description BLOOD RIGHT ANTECUBITAL  Final   Special Requests   Final    BOTTLES DRAWN AEROBIC AND ANAEROBIC Blood Culture adequate volume   Culture   Final    NO GROWTH 5 DAYS Performed at [facility], [address] [place], [place] [address]    Report Status 09/10/2020 FINAL  Final  Culture, blood (Routine X 2) w Reflex to ID Panel     Status: None   Collection Time: 09/05/20  3:04 PM   Specimen: BLOOD  Result Value Ref Range Status   Specimen Description BLOOD BLOOD LEFT HAND  Final   Special Requests   Final    BOTTLES DRAWN AEROBIC AND ANAEROBIC Blood Culture adequate volume   Culture   Final    NO GROWTH 5 DAYS Performed at [facility], [address] [place], [place] [address]    Report Status 09/10/2020 FINAL  Final  MRSA PCR Screening     Status: Abnormal   Collection Time: 09/06/20 12:04 PM   Specimen: Nasopharyngeal  Result Value Ref Range Status   MRSA by PCR POSITIVE (A) NEGATIVE Final    Comment:        The GeneXpert MRSA Assay (FDA approved for NASAL specimens only), is one component of a comprehensive MRSA colonization surveillance program. It is not intended to diagnose MRSA infection nor to guide or monitor treatment for MRSA infections. RESULT CALLED  TO, READ BACK BY AND VERIFIED WITH: KAYLA BRITTON AT 1330 ON 09/06/2020 BY SDR. [facility] Performed at [facility], [address] [place], [place] [address]      Studies: [place] EKG SITE RITE  Result Date: 09/11/2020 If [facility] image not attached, placement could not be confirmed due to current cardiac rhythm.   Scheduled Meds:  aspirin EC  81 mg Oral Daily   atorvastatin  40 mg Oral QPM   benzonatate  100 mg Oral TID   calcium-vitamin D  1 tablet Oral BID   enoxaparin (LOVENOX) injection  0.5 mg/kg Subcutaneous Q24H   fluticasone  2 spray Each Nare Daily    imipramine  25 mg Oral QHS   levothyroxine  25 mcg Oral Q0600   magnesium oxide  400 mg Oral Daily   metoprolol succinate  25 mg Oral Daily   metoprolol tartrate  5 mg Intravenous Once   midodrine  10 mg Oral TID WC   mirabegron ER  50 mg Oral Daily   polyethylene glycol  17 g Oral Daily   potassium chloride  20 mEq Oral BID   torsemide  20 mg Oral BID   Continuous Infusions:  vancomycin 1,250 mg (09/10/20 1845)    Assessment/Plan:  1. Sepsis, present on admission with Streptococcus agalactiae and a left thigh cellulitis.  Patient also had MRSA in one of the blood cultures.  Repeat chest x-ray shows pneumonia.  Patient on IV vancomycin.  Patient will need IV vancomycin through 09/24/2020.  The patient is waiting for one more nursing home to say yes or no.  Plan will either be go out to rehab with IV antibiotics or home with home health.  Spoke with husband about the plan. 2. Anasarca and lymphedema, acute on chronic diastolic congestive heart failure.  Continue torsemide 20 mg twice a day.  Continue to monitor creatinine closely. 3. SVT.  Patient needs Toprol as her heart rate goes up to 180. 4. Relative hypotension continue midodrine. 5. Acute kidney injury on chronic kidney disease stage IIIa.  Creatinine 1.52 on presentation.  Creatinine is low at 0.98 on 09/06/2020.  Last creatinine 1.14.  Check creatinine again tomorrow morning. 6. Hypothyroidism unspecified on low-dose Synthroid 7. Obesity with a BMI of 46.37.  Asked nursing staff to get daily weights. 8. Obstructive sleep apnea.  Patient could not tolerate the BiPAP here in the hospital.  She stated she knows how to switch out her tubing for CPAP at home but her husband needs to bring it in. 9. Weakness.  Physical therapy recommending rehab versus 24/7 supervision        Code Status:     Code Status Orders  (From admission, onward)         Start     Ordered   09/02/20 2024  Full code  Continuous         09/02/20 2025        Code Status History    Date Active Date Inactive Code Status Order ID Comments User Context   10/28/2019 0506 11/07/2019 2326 Full Code [id number]  Sidney Ace Arvella Merles, MD Inpatient   08/07/2019 1604 08/09/2019 1856 Full Code [id number]  Para Skeans, MD ED   08/07/2019 1316 08/07/2019 1604 Full Code [id number]  Para Skeans, MD ED   11/19/2017 1141 11/22/2017 1429 Full Code [id number]  Hessie Knows, MD Inpatient   03/11/2016 1143 03/14/2016 2107 Full Code [id number]  Hessie Knows, MD Inpatient   Advance Care Planning  Activity    Advance Directive Documentation   Flowsheet Row Most Recent Value  Type of Advance Directive Living will  Pre-existing out of facility DNR order (yellow form or pink MOST form) --  "MOST" Form in Place? --     Family Communication: Spoke with patient's husband on the phone Disposition Plan: Status is: Inpatient  Dispo: The patient is from: Home              Anticipated d/c is to: Potentially rehab versus home with home health depending on acceptance to rehab.              Anticipated d/c date is: 09/11/2020              Patient currently waiting to hear back from one more rehab facility.  We will get PICC line.  Will need to be set up for home health and IV antibiotics if rehab facility declines.  Will need IV vancomycin as outpatient  Consultants:  Infectious disease  Antibiotics:  IV vancomycin  Time spent: 27 minutes  [place]

## 2020-09-11 NOTE — Progress Notes (Signed)
PICC placement order received. Primary RN aware PICC will not be placed today.

## 2020-09-11 NOTE — TOC Progression Note (Addendum)
Transition of Care [facility]) - Progression Note    Patient Details  Name: Becky Trevino MRN: [id number] Date of Birth: [birth year]  [facility] CM/SW Contact  Liliana Cline, LCSW Phone Number: 09/11/2020, 9:39 AM  Clinical Narrative:   CSW left voicemail for Tiffany at [facility] requesting follow up on if they can accept patient.   10:25- Spoke with Tiffany from [facility]. She is still trying to figure out if they can take patient due to patient's insurance company.  12:15- Call into patient's room due to contact isolation. Provided update. Asked what patient's plan is if [facility] cannot take her, she reported she does not know of any other SNFs that she would agreeable to go to, so if [facility] can't take her she would want to go home with home health. She really wants to go to [facility]. CSW called Tiffany at [facility] again to see if she has any updates, no answer, left a voicemail.  12:50- Per Home Health Representative, patient's insurance has a $125 per visit copay. Informed patient who said she cannot afford that. Asked what patient's plan is if [facility] says no, asked about extending bed search. She continues to say she would only want to go to the SNFs she already said. Waiting to hear from [facility], patient said she will think of what her back up plan is if [facility] says no.  2:08- Attempted call to [facility] again, left another voicemail for [employer] requesting a return call to tell if they can take this patient. Spoke to MD, if [facility] cannot take patient, can arrange [facility] for home IV meds. [facility] should be able to set up RN for infusions only and should not have a copay, will need to verify this.  Expected Discharge Plan: Home w Home Health Services Barriers to Discharge: No Barriers Identified  Expected Discharge Plan and Services Expected Discharge Plan: Home w Home Health  Services       Living arrangements for the past 2 months: Single Family Home                                       Social Determinants of Health (SDOH) Interventions    Readmission Risk Interventions No flowsheet data found.

## 2020-09-12 DIAGNOSIS — R7881 Bacteremia: Secondary | ICD-10-CM | POA: Diagnosis not present

## 2020-09-12 DIAGNOSIS — B9562 Methicillin resistant Staphylococcus aureus infection as the cause of diseases classified elsewhere: Secondary | ICD-10-CM | POA: Diagnosis not present

## 2020-09-12 DIAGNOSIS — J181 Lobar pneumonia, unspecified organism: Secondary | ICD-10-CM

## 2020-09-12 DIAGNOSIS — L03116 Cellulitis of left lower limb: Secondary | ICD-10-CM | POA: Diagnosis not present

## 2020-09-12 DIAGNOSIS — A419 Sepsis, unspecified organism: Secondary | ICD-10-CM | POA: Diagnosis not present

## 2020-09-12 LAB — CBC
HCT: 35.2 % — ABNORMAL LOW (ref 36.0–46.0)
Hemoglobin: 11.5 g/dL — ABNORMAL LOW (ref 12.0–15.0)
MCH: 30.9 pg (ref 26.0–34.0)
MCHC: 32.7 g/dL (ref 30.0–36.0)
MCV: 94.6 fL (ref 80.0–100.0)
Platelets: 253 10*3/uL (ref 150–400)
RBC: 3.72 MIL/uL — ABNORMAL LOW (ref 3.87–5.11)
RDW: 17 % — ABNORMAL HIGH (ref 11.5–15.5)
WBC: 6.7 10*3/uL (ref 4.0–10.5)
nRBC: 0 % (ref 0.0–0.2)

## 2020-09-12 LAB — BASIC METABOLIC PANEL
Anion gap: 10 (ref 5–15)
BUN: 26 mg/dL — ABNORMAL HIGH (ref 8–23)
CO2: 30 mmol/L (ref 22–32)
Calcium: 9.2 mg/dL (ref 8.9–10.3)
Chloride: 99 mmol/L (ref 98–111)
Creatinine, Ser: 1.28 mg/dL — ABNORMAL HIGH (ref 0.44–1.00)
GFR, Estimated: 44 mL/min — ABNORMAL LOW (ref >60)
Glucose, Bld: 76 mg/dL (ref 70–99)
Potassium: 4 mmol/L (ref 3.5–5.1)
Sodium: 139 mmol/L (ref 135–145)

## 2020-09-12 MED ORDER — SODIUM CHLORIDE 0.9% FLUSH: 10.0000 mL | INTRAVENOUS | Status: DC | PRN

## 2020-09-12 MED ORDER — LACTATED RINGERS IV SOLN: INTRAVENOUS | Status: AC

## 2020-09-12 MED ORDER — CHLORHEXIDINE GLUCONATE CLOTH 2 % EX PADS
6.0000 | MEDICATED_PAD | Freq: Every day | CUTANEOUS | Status: DC
  Administered 2020-09-12 – 2020-09-14 (×3): 6 via TOPICAL

## 2020-09-12 MED ORDER — SODIUM CHLORIDE 0.9% FLUSH
10.0000 mL | Freq: Two times a day (BID) | INTRAVENOUS | Status: DC
  Administered 2020-09-12 – 2020-09-14 (×5): 10 mL

## 2020-09-12 NOTE — Progress Notes (Signed)
Peripherally Inserted Central Catheter Placement  The IV Nurse has discussed with the patient and/or persons authorized to consent for the patient, the purpose of this procedure and the potential benefits and risks involved with this procedure.  The benefits include less needle sticks, lab draws from the catheter, and the patient may be discharged home with the catheter. Risks include, but not limited to, infection, bleeding, blood clot (thrombus formation), and puncture of an artery; nerve damage and irregular heartbeat and possibility to perform a PICC exchange if needed/ordered by physician.  Alternatives to this procedure were also discussed.  Bard Power PICC patient education guide, fact sheet on infection prevention and patient information card has been provided to patient /or left at bedside.    PICC Placement Documentation  PICC Single Lumen 09/12/20 PICC Right Cephalic 44 cm 0 cm (Active)  Indication for Insertion or Continuance of Line Home intravenous therapies (PICC only) 09/12/20 1220  Exposed Catheter (cm) 0 cm 09/12/20 1220  Site Assessment Clean;Dry;Intact 09/12/20 1220  Line Status Flushed;Saline locked;Blood return noted 09/12/20 1220  Dressing Type Transparent;Securing device 09/12/20 1220  Dressing Status Clean;Dry;Intact 09/12/20 1220  Antimicrobial disc in place? Yes 09/12/20 1220  Safety Lock Not Applicable 09/12/20 1220  Dressing Intervention New dressing 09/12/20 1220  Dressing Change Due 09/19/20 09/12/20 1220       Annett Fabian 09/12/2020, 12:43 PM

## 2020-09-12 NOTE — Progress Notes (Signed)
ID Husband at bedside Pt doing better Pain left thigh resolved Cough much improved No fever  Patient Vitals for the past 24 hrs:  BP Temp Temp src Pulse Resp SpO2 Weight  09/12/20 1527 (!) 87/43 -- -- 78 15 97 % --  09/12/20 1350 (!) 101/56 98.5 F (36.9 C) -- 81 18 96 % --  09/12/20 0723 100/64 97.9 F (36.6 C) Oral 63 16 100 % --  09/12/20 0616 -- -- -- -- -- -- (!) 147 kg  09/12/20 0508 (!) 106/58 98 F (36.7 C) -- 79 17 93 % --  09/12/20 0106 (!) 102/53 98.3 F (36.8 C) -- 74 18 92 % --  09/11/20 2223 (!) 106/57 -- -- -- -- -- --  09/11/20 2015 (!) 110/44 98.4 F (36.9 C) -- 76 18 97 % --  09/11/20 1738 (!) 131/58 98.2 F (36.8 C) Oral 76 16 100 % --  Looks well No distress Chest b/l air entry [id number] And soft Left upper thigh erythema, induration resolved Lymphedema legs   CBC Latest Ref Rng & Units 09/12/2020 09/09/2020 09/07/2020  WBC 4.0 - 10.5 K/uL 6.7 5.9 7.2  Hemoglobin 12.0 - 15.0 g/dL 11.5(L) 10.9(L) 10.8(L)  Hematocrit 36.0 - 46.0 % 35.2(L) 33.7(L) 33.7(L)  Platelets 150 - 400 K/uL 253 196 158    CMP Latest Ref Rng & Units 09/12/2020 09/10/2020 09/09/2020  Glucose 70 - 99 mg/dL 76 94 87  BUN 8 - 23 mg/dL 58(N) 23 21  Creatinine 0.44 - 1.00 mg/dL 2.77(O) 2.42(P) 5.36(R)  Sodium 135 - 145 mmol/L 139 136 137  Potassium 3.5 - 5.1 mmol/L 4.0 4.2 3.7  Chloride 98 - 111 mmol/L 99 102 103  CO2 22 - 32 mmol/L 30 26 26   Calcium 8.9 - 10.3 mg/dL 9.2 ) 4.4(R)  Total Protein 6.5 - 8.1 g/dL - - -  Total Bilirubin 0.3 - 1.2 mg/dL - - -  Alkaline Phos 38 - 126 U/L - - -  AST 15 - 41 U/L - - -  ALT 0 - 44 U/L - - -    Impression/recommendation Group B streptococcus bacteremia and 1of 4  MRSA bacteremi On vancomycin.   The bioburden of MRSA is minimal. There is no purulence noted in the cellulitic area.MRSA nares positive and  left lower lobe infiltrate could be explained by MRSA Also has staph hemolyticus one and 4 bottles which is considered to be a  contaminant.  Echo valves okay and repeat blood cultures negative We will give antibiotics . Until 09/24/2020. As cr is increasing will have to closely monitor and may switch to daptomycin instead of vanco if it worsens tomorrow  Severe lymphedema both legs will need compressive therapy.  Left femoral neuropathy  Right popliteal DVT status post filter  Severe obesity  Left THA- no septic arthritis  Right TKA, prosthetic joint infection with coag negative staph in 2017 was on suppressive doxy therapy after initial IV antibiotics  Discussed the management with patient and her husband-

## 2020-09-12 NOTE — Progress Notes (Signed)
Physical Therapy Treatment Patient Details Name: Becky Trevino MRN: [id number] DOB: [birth year] Today's Date: 09/12/2020    History of Present Illness 74 y.o. female with medical history significant of DDD/spondylosis of cervical spine, stage III chronic kidney disease, type II DM, diverticulosis, hyperlipidemia, hypertension, intent trigger, leg cramps, varicose veins of lower extremities, lymphedema, obesity, OSA, syncopal episode, chronic diastolic CHF who is coming to the emergency department via EMS 2 days in a row due to progressively worse chills, rigors, fever, frontal headache, lightheadedness, decreased appetite, fatigue and malaise, which she said started about a week ago.  The patient stated that she was having RLE pain due to sitting in her wheelchair.  She tried to get up, but her RLE was too weak to stand and fell.    PT Comments    Pt ready for session with encouragement.  Stated she had a hard time getting back to bed with nursing yesterday and hesitant to get in chair.  Transitioned to sitting with min a x 1 and increased time.  Sitting LE ex x 10.  IV team in for PICC line so further mobility deferred.  Returned to supine with mod a x 1 and max a x 2 to reposition up in bed.   Follow Up Recommendations  SNF;Supervision/Assistance - 24 hour     Equipment Recommendations       Recommendations for Other Services       Precautions / Restrictions Precautions Precautions: Fall Restrictions Weight Bearing Restrictions: No    Mobility  Bed Mobility Overal bed mobility: Needs Assistance Bed Mobility: Supine to Sit;Sit to Supine     Supine to sit: Min assist Sit to supine: Mod assist      Transfers                    Ambulation/Gait                 Stairs             Wheelchair Mobility    Modified Rankin (Stroke Patients Only)       Balance Overall balance assessment: Needs assistance Sitting-balance support: Feet  supported Sitting balance-Leahy Scale: Fair Sitting balance - Comments: posterior lean, LLE slides forward                                    Cognition Arousal/Alertness: Awake/alert Behavior During Therapy: WFL for tasks assessed/performed Overall Cognitive Status: Within Functional Limits for tasks assessed                                 General Comments: Pt is A and O x 4. cooperative and pleasant.      Exercises Other Exercises Other Exercises: seated AROM BLE x 10    General Comments        Pertinent Vitals/Pain Pain Assessment: No/denies pain    Home Living                      Prior Function            PT Goals (current goals can now be found in the care plan section) Progress towards PT goals: Progressing toward goals    Frequency    Min 2X/week      PT Plan Current plan remains appropriate    Co-evaluation  AM-PAC PT "6 Clicks" Mobility   Outcome Measure  Help needed turning from your back to your side while in a flat bed without using bedrails?: A Lot Help needed moving from lying on your back to sitting on the side of a flat bed without using bedrails?: A Lot Help needed moving to and from a bed to a chair (including a wheelchair)?: A Lot Help needed standing up from a chair using your arms (e.g., wheelchair or bedside chair)?: A Lot Help needed to walk in hospital room?: A Lot Help needed climbing 3-5 steps with a railing? : Total 6 Click Score: 11    End of Session   Activity Tolerance: Patient tolerated treatment well Patient left: in bed;with call bell/phone within reach;with bed alarm set Nurse Communication: Mobility status Pain - Right/Left: Right Pain - part of body: Hip     Time: [id number] PT Time Calculation (min) (ACUTE ONLY): 10 min  Charges:  $Therapeutic Exercise: 8-22 mins                     Chesley Noon, PTA 09/12/20, 12:09 PM

## 2020-09-12 NOTE — Progress Notes (Signed)
PROGRESS NOTE    Becky Trevino  [id number] DOB: 03-04-1946 DOA: 09/02/2020 PCP: [name], MD   Chief Complaint  Patient presents with  . Fever  . Fall    Brief Narrative: 74 year old female with history of DDD/spondylosis of cervical spine, stage III CKD, T2DM, diverticulosis, hyperlipidemia, hypertension, bilateral lower extremity extensive lymphedema bedbound, obesity/OSA, history of syncope, chronic diastolic CHF coming to the ED for second day in a row due to multiple complaints of progressive worsening chills, rigors, fever, frontal headache, lightheadedness, decreased appetite/fatigue/malaise 1 set about a week ago prior to admission.  Initially right lower extremity pain after a fall/from wheelchair, no LOC or head injury. As per EMS patient was hypotensive on arrival and given a liter of IV fluids. In the ED work-up showed UA hazy with large hemoglobinuria proteinuria microscopic urine exam shows RBC more than 50 WBC 6-10, WBC count 10.4 with 92% neutrophils, CMP showed normal electrolytes, elevated creatinine 1.5, lactic acid is 2.9. given IV fluids. CXR cardiomegaly with CHF ? Underlying infectious process,-BNP was obtained that was marginally up to 224, pelvis x-ray no acute fracture, RT Hip- end stage degenerative changes. Patient was admitted for AKI, sepsis due to undetermined etiology. blood cultures showing 3 out of 4 Strep agalactiae ID was consulted and she was continued on IV antibiotics. Patient also had MRSA in one of the blood cultures.  Her x-ray subsequently showed pneumonia, as per ID patient is being treated with IV vancomycin. She has been diuresed for her acute on chronic diastolic CHF anasarca and lymphedema. Seen by PT OT and has advised skilled nursing facility TOC involved.   Subjective: Patient seen and examined this morning.  She is hopeful about going to skilled nursing facility. Creatinine is up this am   Assessment & Plan:  Sepsis POA  due to Streptococcus agalactiae/left thigh abscess: Sepsis likely from left thigh cellulitis.  Also MRSA in her blood culture.  Chest x-ray repeated showed pneumonia.  Appreciate ID input patient will continue antibiotic vancomycin with be getting PICC line today and plan is to continue vancomycin through 09/24/2020.  She is awaiting on placement.  Creatinine slightly up we will monitor in a.m.  If unable to go to skilled nursing facility she will go to home and home health will be set up  Anasarca/lymphedema with acute on chronic diastolic CHF: Currently on torsemide monitor renal function as creatinine slowly trending.  AKI on CKD stage III AAA, creatinine 1.52 on presentation but down trended to 0.9 now slowly uptrending.  Given that patient will continue vancomycin will need to monitor and follow very closely.  I will add gentle IV fluids overnight. Recent Labs  Lab 09/06/20 0417 09/07/20 0510 09/08/20 0348 09/09/20 0408 09/10/20 0301 09/12/20 0416  BUN 14 19  --  21 23 26*  CREATININE 0.98 1.10* 1.17* 1.18* 1.14* 1.28*   SVT continue Toprol as heart rate this admission went up to 180.   Hypothyroidism continue low-dose Synthroid.  Morbid obesity with BMI 45.  Will benefit with weight loss and healthy lifestyle but not sure how much feasible will it be in her current health situation.  Patient reports she has not been ambulatory for some time.  OSA could not tolerate BiPAP here in the hospital continue CPAP at home.  Generalized weakness continue PT OT  HTN/Hyperlipidemia: Continue Lipitor, aspirin.  She is on midodrine, TOPROL.  Diet Order            Diet heart healthy/carb modified Room  service appropriate? Yes; Fluid consistency: Thin  Diet effective now                   Body mass index is 45.2 kg/m.  DVT prophylaxis: lovenox Code Status:   Code Status: Full Code  Family Communication: plan of care discussed with patient at bedside.  Status is: Inpatient  Remains  inpatient appropriate because:Unsafe d/c plan, IV treatments appropriate due to intensity of illness or inability to take PO and Inpatient level of care appropriate due to severity of illness   Dispo: The patient is from: Home              Anticipated d/c is to: SNF              Anticipated d/c date is: 1 day              Patient currently is not medically stable to d/c.  Consultants:see note  Procedures:see note  Culture/Microbiology    Component Value Date/Time   SDES BLOOD BLOOD LEFT HAND 09/05/2020 1504   SPECREQUEST  09/05/2020 1504    BOTTLES DRAWN AEROBIC AND ANAEROBIC Blood Culture adequate volume   CULT  09/05/2020 1504    NO GROWTH 5 DAYS Performed at [facility], [address] [place], [place] [address]    REPTSTATUS 09/10/2020 FINAL 09/05/2020 1504    Other culture-see note  Medications: Scheduled Meds: . aspirin EC  81 mg Oral Daily  . atorvastatin  40 mg Oral QPM  . benzonatate  100 mg Oral TID  . calcium-vitamin D  1 tablet Oral BID  . Chlorhexidine Gluconate Cloth  6 each Topical Daily  . enoxaparin (LOVENOX) injection  0.5 mg/kg Subcutaneous Q24H  . fluticasone  2 spray Each Nare Daily  . imipramine  25 mg Oral QHS  . levothyroxine  25 mcg Oral Q0600  . magnesium oxide  400 mg Oral Daily  . metoprolol succinate  25 mg Oral Daily  . metoprolol tartrate  5 mg Intravenous Once  . midodrine  10 mg Oral TID WC  . mirabegron ER  50 mg Oral Daily  . polyethylene glycol  17 g Oral Daily  . potassium chloride  20 mEq Oral BID  . sodium chloride flush  10-40 mL Intracatheter Q12H  . torsemide  20 mg Oral BID   Continuous Infusions: . sodium chloride 500 mL (09/11/20 1756)  . vancomycin 1,250 mg (09/11/20 1759)    Antimicrobials: Anti-infectives (From admission, onward)   Start     Dose/Rate Route Frequency Ordered Stop   09/10/20 1830  vancomycin (VANCOREADY) IVPB 1250 mg/250 mL        1,250 mg 166.7 mL/hr over 90 Minutes Intravenous Every  24 hours 09/10/20 1731     09/07/20 1700  vancomycin (VANCOREADY) IVPB 1500 mg/300 mL  Status:  Discontinued        1,500 mg 150 mL/hr over 120 Minutes Intravenous Every 24 hours 09/07/20 0742 09/10/20 1731   09/06/20 1700  vancomycin (VANCOREADY) IVPB 1750 mg/350 mL  Status:  Discontinued        1,750 mg 175 mL/hr over 120 Minutes Intravenous Every 24 hours 09/06/20 1541 09/07/20 0742   09/06/20 1600  vancomycin (VANCOREADY) IVPB 1500 mg/300 mL  Status:  Discontinued        1,500 mg 150 mL/hr over 120 Minutes Intravenous Every 24 hours 09/05/20 1456 09/06/20 1541   09/05/20 1600  vancomycin (VANCOREADY) IVPB 2000 mg/400 mL  2,000 mg 200 mL/hr over 120 Minutes Intravenous  Once 09/05/20 1456 09/05/20 1712   09/04/20 0000  cefTRIAXone (ROCEPHIN) 2 g in sodium chloride 0.9 % 100 mL IVPB  Status:  Discontinued        2 g 200 mL/hr over 30 Minutes Intravenous Every 24 hours 09/02/20 2230 09/03/20 0754   09/03/20 2000  azithromycin (ZITHROMAX) 500 mg in sodium chloride 0.9 % 250 mL IVPB  Status:  Discontinued        500 mg 250 mL/hr over 60 Minutes Intravenous Every 24 hours 09/02/20 2230 09/03/20 0754   09/03/20 1800  penicillin G potassium 9 Million Units in dextrose 5 % 500 mL continuous infusion  Status:  Discontinued        9 Million Units 41.7 mL/hr over 12 Hours Intravenous Every 12 hours 09/03/20 1558 09/05/20 1456   09/03/20 1300  doxycycline (VIBRA-TABS) tablet 100 mg  Status:  Discontinued        100 mg Oral 2 times daily 09/03/20 1209 09/05/20 1504   09/03/20 1000  penicillin G potassium 4 Million Units in dextrose 5 % 250 mL IVPB  Status:  Discontinued        4 Million Units 250 mL/hr over 60 Minutes Intravenous Every 4 hours 09/03/20 0754 09/03/20 1558   09/02/20 2245  cefTRIAXone (ROCEPHIN) 1 g in sodium chloride 0.9 % 100 mL IVPB        1 g 200 mL/hr over 30 Minutes Intravenous  Once 09/02/20 2230 09/03/20 0045   09/02/20 1915  cefTRIAXone (ROCEPHIN) 1 g in sodium  chloride 0.9 % 100 mL IVPB        1 g 200 mL/hr over 30 Minutes Intravenous  Once 09/02/20 1905 09/02/20 2006   09/02/20 1915  azithromycin (ZITHROMAX) 500 mg in sodium chloride 0.9 % 250 mL IVPB        500 mg 250 mL/hr over 60 Minutes Intravenous  Once 09/02/20 1905 09/02/20 2110     Objective: Vitals: Today's Vitals   09/12/20 0508 09/12/20 0616 09/12/20 0723 09/12/20 1350  BP: (!) 106/58  100/64 (!) 101/56  Pulse: 79  63 81  Resp: 17  16 18   Temp: 98 F (36.7 C)  97.9 F (36.6 C) 98.5 F (36.9 C)  TempSrc:   Oral   SpO2: 93%  100% 96%  Weight:  (!) 147 kg    Height:      PainSc:   0-No pain     Intake/Output Summary (Last 24 hours) at 09/12/2020 1411 Last data filed at 09/12/2020 0500 Gross per 24 hour  Intake 529.97 ml  Output 700 ml  Net -170.03 ml   Filed Weights   09/10/20 0949 09/11/20 1326 09/12/20 0616  Weight: (!) 150.8 kg (!) 145.8 kg (!) 147 kg   Weight change: -5 kg  Intake/Output from previous day: 12/28 0701 - 12/29 0700 In: 530 [I.V.:29.9; IV Piggyback:500.1] Out: 1500 [Urine:1500] Intake/Output this shift: No intake/output data recorded.  Examination: General exam: AAOx3, morbidly obese,NAD, weak appearing. HEENT:Oral mucosa moist, Ear/Nose WNL grossly,dentition normal. Respiratory system: bilaterally clear,no wheezing or crackles,no use of accessory muscle, non tender. Cardiovascular system: S1 & S2 +, regular, No JVD. Gastrointestinal system: Abdomen soft, NT,ND, BS+. Nervous System:Alert, awake, moving extremities and grossly nonfocal Extremities: Bilateral extensive lymphedema, distal peripheral pulses palpable.  Skin: No rashes,no icterus. MSK: Normal muscle bulk,tone, power  Data Reviewed: I have personally reviewed following labs and imaging studies CBC: Recent Labs  Lab 09/06/20 0416 09/07/20  0510 09/09/20 0408 09/12/20 0416  WBC 6.4 7.2 5.9 6.7  NEUTROABS 4.6  --   --   --   HGB 11.8* 10.8* 10.9* 11.5*  HCT 35.8* 33.7*  33.7* 35.2*  MCV 94.5 94.7 94.7 94.6  PLT 140* 158 196 [id number]   Basic Metabolic Panel: Recent Labs  Lab 09/06/20 0417 09/07/20 0510 09/08/20 0348 09/09/20 0408 09/10/20 0301 09/12/20 0416  NA 137 136  --  137 136 139  K 3.9 3.9  --  3.7 4.2 4.0  CL 102 102  --  103 102 99  CO2 24 24  --  26 26 30   GLUCOSE 87 91  --  87 94 76  BUN 14 19  --  21 23 26*  CREATININE 0.98 1.10* 1.17* 1.18* 1.14* 1.28*  CALCIUM 8.9 8.9  --  8.8* 8.6* 9.2  MG 2.4  --   --   --  2.3  --    GFR: Estimated Creatinine Clearance: 61.7 mL/min (A) (by C-G formula based on SCr of 1.28 mg/dL (H)). Liver Function Tests: No results for input(s): AST, ALT, ALKPHOS, BILITOT, PROT, ALBUMIN in the last 168 hours. No results for input(s): LIPASE, AMYLASE in the last 168 hours. No results for input(s): AMMONIA in the last 168 hours. Coagulation Profile: No results for input(s): INR, PROTIME in the last 168 hours. Cardiac Enzymes: No results for input(s): CKTOTAL, CKMB, CKMBINDEX, TROPONINI in the last 168 hours. BNP (last 3 results) No results for input(s): PROBNP in the last 8760 hours. HbA1C: No results for input(s): HGBA1C in the last 72 hours. CBG: No results for input(s): GLUCAP in the last 168 hours. Lipid Profile: No results for input(s): CHOL, HDL, LDLCALC, TRIG, CHOLHDL, LDLDIRECT in the last 72 hours. Thyroid Function Tests: No results for input(s): TSH, T4TOTAL, FREET4, T3FREE, THYROIDAB in the last 72 hours. Anemia Panel: No results for input(s): VITAMINB12, FOLATE, FERRITIN, TIBC, IRON, RETICCTPCT in the last 72 hours. Sepsis Labs: No results for input(s): PROCALCITON, LATICACIDVEN in the last 168 hours.  Recent Results (from the past 240 hour(s))  Resp Panel by RT-PCR (Flu A&B, Covid) Nasopharyngeal Swab     Status: None   Collection Time: 09/02/20  4:44 PM   Specimen: Nasopharyngeal Swab; Nasopharyngeal(NP) swabs in vial transport medium  Result Value Ref Range Status   SARS Coronavirus 2 by  RT PCR NEGATIVE NEGATIVE Final    Comment: (NOTE) SARS-CoV-2 target nucleic acids are NOT DETECTED.  The SARS-CoV-2 RNA is generally detectable in upper respiratory specimens during the acute phase of infection. The lowest concentration of SARS-CoV-2 viral copies this assay can detect is 138 copies/mL. A negative result does not preclude SARS-Cov-2 infection and should not be used as the sole basis for treatment or other patient management decisions. A negative result may occur with  improper specimen collection/handling, submission of specimen other than nasopharyngeal swab, presence of viral mutation(s) within the areas targeted by this assay, and inadequate number of viral copies(<138 copies/mL). A negative result must be combined with clinical observations, patient history, and epidemiological information. The expected result is Negative.  Fact Sheet for Patients:  EntrepreneurPulse.com.au  Fact Sheet for Healthcare Providers:  IncredibleEmployment.be  This test is no t yet approved or cleared by the [place] FDA and  has been authorized for detection and/or diagnosis of SARS-CoV-2 by FDA under an Emergency Use Authorization (EUA). This EUA will remain  in effect (meaning this test can be used) for the duration of the COVID-19 declaration  under Section 564(b)(1) of the Act, 21 U.S.C.section 360bbb-3(b)(1), unless the authorization is terminated  or revoked sooner.       Influenza A by PCR NEGATIVE NEGATIVE Final   Influenza B by PCR NEGATIVE NEGATIVE Final    Comment: (NOTE) The Xpert Xpress SARS-CoV-2/FLU/RSV plus assay is intended as an aid in the diagnosis of influenza from Nasopharyngeal swab specimens and should not be used as a sole basis for treatment. Nasal washings and aspirates are unacceptable for Xpert Xpress SARS-CoV-2/FLU/RSV testing.  Fact Sheet for Patients: EntrepreneurPulse.com.au  Fact Sheet  for Healthcare Providers: IncredibleEmployment.be  This test is not yet approved or cleared by the [place] FDA and has been authorized for detection and/or diagnosis of SARS-CoV-2 by FDA under an Emergency Use Authorization (EUA). This EUA will remain in effect (meaning this test can be used) for the duration of the COVID-19 declaration under Section 564(b)(1) of the Act, 21 U.S.C. section 360bbb-3(b)(1), unless the authorization is terminated or revoked.  Performed at [facility], [address] [place], [place] [address]   Blood Culture ID Panel (Reflexed)     Status: Abnormal   Collection Time: 09/02/20  5:13 PM  Result Value Ref Range Status   Enterococcus faecalis NOT DETECTED NOT DETECTED Final   Enterococcus Faecium NOT DETECTED NOT DETECTED Final   Listeria monocytogenes NOT DETECTED NOT DETECTED Final   Staphylococcus species DETECTED (A) NOT DETECTED Final    Comment: CRITICAL RESULT CALLED TO, READ BACK BY AND VERIFIED WITH:  AMY THOMPSON AT 1004 09/03/20 SDR    Staphylococcus aureus (BCID) NOT DETECTED NOT DETECTED Final   Staphylococcus epidermidis DETECTED (A) NOT DETECTED Final    Comment: Methicillin (oxacillin) resistant coagulase negative staphylococcus. Possible blood culture contaminant (unless isolated from more than one blood culture draw or clinical case suggests pathogenicity). No antibiotic treatment is indicated for blood  culture contaminants. CRITICAL RESULT CALLED TO, READ BACK BY AND VERIFIED WITH:  AMY THOMPSON AT 1004 09/03/20 SDR    Staphylococcus lugdunensis NOT DETECTED NOT DETECTED Final   Streptococcus species NOT DETECTED NOT DETECTED Final   Streptococcus agalactiae NOT DETECTED NOT DETECTED Final   Streptococcus pneumoniae NOT DETECTED NOT DETECTED Final   Streptococcus pyogenes NOT DETECTED NOT DETECTED Final   A.calcoaceticus-baumannii NOT DETECTED NOT DETECTED Final   Bacteroides fragilis NOT DETECTED  NOT DETECTED Final   Enterobacterales NOT DETECTED NOT DETECTED Final   Enterobacter cloacae complex NOT DETECTED NOT DETECTED Final   Escherichia coli NOT DETECTED NOT DETECTED Final   Klebsiella aerogenes NOT DETECTED NOT DETECTED Final   Klebsiella oxytoca NOT DETECTED NOT DETECTED Final   Klebsiella pneumoniae NOT DETECTED NOT DETECTED Final   Proteus species NOT DETECTED NOT DETECTED Final   Salmonella species NOT DETECTED NOT DETECTED Final   Serratia marcescens NOT DETECTED NOT DETECTED Final   Haemophilus influenzae NOT DETECTED NOT DETECTED Final   Neisseria meningitidis NOT DETECTED NOT DETECTED Final   Pseudomonas aeruginosa NOT DETECTED NOT DETECTED Final   Stenotrophomonas maltophilia NOT DETECTED NOT DETECTED Final   Candida albicans NOT DETECTED NOT DETECTED Final   Candida auris NOT DETECTED NOT DETECTED Final   Candida glabrata NOT DETECTED NOT DETECTED Final   Candida krusei NOT DETECTED NOT DETECTED Final   Candida parapsilosis NOT DETECTED NOT DETECTED Final   Candida tropicalis NOT DETECTED NOT DETECTED Final   Cryptococcus neoformans/gattii NOT DETECTED NOT DETECTED Final   Methicillin resistance mecA/C DETECTED (A) NOT DETECTED Final    Comment: CRITICAL  RESULT CALLED TO, READ BACK BY AND VERIFIED WITH:  AMY THOMPSON AT 1004 09/03/20 SDR Performed at [facility], [address] [place], [place] [address]   Blood culture (routine x 2)     Status: Abnormal   Collection Time: 09/02/20  5:25 PM   Specimen: BLOOD  Result Value Ref Range Status   Specimen Description   Final    BLOOD BLOOD RIGHT HAND Performed at [facility], [address], [place], [place] [address]    Special Requests   Final    BOTTLES DRAWN AEROBIC AND ANAEROBIC Blood Culture results may not be optimal due to an inadequate volume of blood received in culture bottles Performed at [facility], [address] [place], [place] [address]    Culture  Setup Time    Final    Organism ID to follow [place].  VALUE IS CONSISTENT WITH PREVIOUSLY REPORTED AND CALLED VALUE. GRAM POSITIVE COCCI ANAEROBIC BOTTLE ONLY CRITICAL RESULT CALLED TO, READ BACK BY AND VERIFIED WITH: AMY THOMPSON AT 1004 09/03/20 SDR Performed at [facility], [address] [place], [place] [address]    Culture (A)  Final    GROUP B STREP(S.AGALACTIAE)ISOLATED SUSCEPTIBILITIES PERFORMED ON PREVIOUS CULTURE WITHIN THE LAST 5 DAYS. STAPHYLOCOCCUS HAEMOLYTICUS THE SIGNIFICANCE OF ISOLATING THIS ORGANISM FROM A SINGLE SET OF BLOOD CULTURES WHEN MULTIPLE SETS ARE DRAWN IS UNCERTAIN. PLEASE NOTIFY THE MICROBIOLOGY DEPARTMENT WITHIN ONE WEEK IF SPECIATION AND SENSITIVITIES ARE REQUIRED. Performed at [facility], [place] [address] [place], [place] [address]    Report Status 09/06/2020 FINAL  Final  Blood culture (routine x 2)     Status: Abnormal   Collection Time: 09/02/20  5:25 PM   Specimen: BLOOD  Result Value Ref Range Status   Specimen Description   Final    BLOOD BLOOD LEFT HAND Performed at [facility], [address] [place], [place] [address]    Special Requests   Final    BOTTLES DRAWN AEROBIC AND ANAEROBIC Blood Culture results may not be optimal due to an inadequate volume of blood received in culture bottles Performed at [facility], [address] [place], [place] [address]    Culture  Setup Time   Final    Organism ID to follow GRAM POSITIVE COCCI IN CHAINS IN BOTH AEROBIC AND ANAEROBIC BOTTLES CRITICAL RESULT CALLED TO, READ BACK BY AND VERIFIED WITH: JASON ROBBINS AT [id number] 09/03/20 [place] Performed at [facility], [address] [place], [place] [address]    Culture (A)  Final    GROUP B STREP(S.AGALACTIAE)ISOLATED METHICILLIN RESISTANT STAPHYLOCOCCUS AUREUS CRITICAL RESULT CALLED TO, READ BACK BY AND VERIFIED WITH: PHARMD THOMPSON, A. [id number]  FCP Performed at [facility], [place] [address], [place],  [address]    Report Status 09/06/2020 FINAL  Final   Organism ID, Bacteria GROUP B STREP(S.AGALACTIAE)ISOLATED  Final   Organism ID, Bacteria METHICILLIN RESISTANT STAPHYLOCOCCUS AUREUS  Final      Susceptibility   Group b strep(s.agalactiae)isolated - MIC*    CLINDAMYCIN >=1 RESISTANT Resistant     AMPICILLIN <=0.25 SENSITIVE Sensitive     ERYTHROMYCIN >=8 RESISTANT Resistant     VANCOMYCIN 0.5 SENSITIVE Sensitive     CEFTRIAXONE <=0.12 SENSITIVE Sensitive     LEVOFLOXACIN 1 SENSITIVE Sensitive     * GROUP B STREP(S.AGALACTIAE)ISOLATED   Methicillin resistant staphylococcus aureus - MIC*    CIPROFLOXACIN >=  8 RESISTANT Resistant     ERYTHROMYCIN >=8 RESISTANT Resistant     GENTAMICIN <=0.5 SENSITIVE Sensitive     OXACILLIN >=4 RESISTANT Resistant     TETRACYCLINE >=16 RESISTANT Resistant     VANCOMYCIN 1 SENSITIVE Sensitive     TRIMETH/SULFA >=320 RESISTANT Resistant     CLINDAMYCIN >=8 RESISTANT Resistant     RIFAMPIN <=0.5 SENSITIVE Sensitive     Inducible Clindamycin NEGATIVE Sensitive     * METHICILLIN RESISTANT STAPHYLOCOCCUS AUREUS  Urine Culture     Status: Abnormal   Collection Time: 09/02/20  5:25 PM   Specimen: Urine, Random  Result Value Ref Range Status   Specimen Description   Final    URINE, RANDOM Performed at [facility], [address] [place], [place] [address]    Special Requests   Final    NONE Performed at [facility], [address] [place], [place] [address]    Culture 70,000 COLONIES/mL PROTEUS MIRABILIS (A)  Final   Report Status 09/04/2020 FINAL  Final   Organism ID, Bacteria PROTEUS MIRABILIS (A)  Final      Susceptibility   Proteus mirabilis - MIC*    AMPICILLIN <=2 SENSITIVE Sensitive     CEFAZOLIN 8 SENSITIVE Sensitive     CEFEPIME <=0.12 SENSITIVE Sensitive     CEFTRIAXONE <=0.25 SENSITIVE Sensitive     CIPROFLOXACIN <=0.25 SENSITIVE Sensitive      GENTAMICIN <=1 SENSITIVE Sensitive     IMIPENEM 1 SENSITIVE Sensitive     NITROFURANTOIN 128 RESISTANT Resistant     TRIMETH/SULFA <=20 SENSITIVE Sensitive     AMPICILLIN/SULBACTAM <=2 SENSITIVE Sensitive     PIP/TAZO <=4 SENSITIVE Sensitive     * 70,000 COLONIES/mL PROTEUS MIRABILIS  Blood Culture ID Panel (Reflexed)     Status: Abnormal   Collection Time: 09/02/20  5:25 PM  Result Value Ref Range Status   Enterococcus faecalis NOT DETECTED NOT DETECTED Final   Enterococcus Faecium NOT DETECTED NOT DETECTED Final   Listeria monocytogenes NOT DETECTED NOT DETECTED Final   Staphylococcus species NOT DETECTED NOT DETECTED Final   Staphylococcus aureus (BCID) NOT DETECTED NOT DETECTED Final   Staphylococcus epidermidis NOT DETECTED NOT DETECTED Final   Staphylococcus lugdunensis NOT DETECTED NOT DETECTED Final   Streptococcus species DETECTED (A) NOT DETECTED Final    Comment: CRITICAL RESULT CALLED TO, READ BACK BY AND VERIFIED WITH: JASON ROBBINS AT 0715 09/03/20 SDR    Streptococcus agalactiae DETECTED (A) NOT DETECTED Final    Comment: CRITICAL RESULT CALLED TO, READ BACK BY AND VERIFIED WITH:  JASON ROBBINS AT 0715 09/03/20 SDR    Streptococcus pneumoniae NOT DETECTED NOT DETECTED Final   Streptococcus pyogenes NOT DETECTED NOT DETECTED Final   A.calcoaceticus-baumannii NOT DETECTED NOT DETECTED Final   Bacteroides fragilis NOT DETECTED NOT DETECTED Final   Enterobacterales NOT DETECTED NOT DETECTED Final   Enterobacter cloacae complex NOT DETECTED NOT DETECTED Final   Escherichia coli NOT DETECTED NOT DETECTED Final   Klebsiella aerogenes NOT DETECTED NOT DETECTED Final   Klebsiella oxytoca NOT DETECTED NOT DETECTED Final   Klebsiella pneumoniae NOT DETECTED NOT DETECTED Final   Proteus species NOT DETECTED NOT DETECTED Final   Salmonella species NOT DETECTED NOT DETECTED Final   Serratia marcescens NOT DETECTED NOT DETECTED Final   Haemophilus influenzae NOT DETECTED NOT  DETECTED Final   Neisseria meningitidis NOT DETECTED NOT DETECTED Final   Pseudomonas aeruginosa NOT DETECTED NOT DETECTED Final   Stenotrophomonas maltophilia NOT DETECTED NOT  DETECTED Final   Candida albicans NOT DETECTED NOT DETECTED Final   Candida auris NOT DETECTED NOT DETECTED Final   Candida glabrata NOT DETECTED NOT DETECTED Final   Candida krusei NOT DETECTED NOT DETECTED Final   Candida parapsilosis NOT DETECTED NOT DETECTED Final   Candida tropicalis NOT DETECTED NOT DETECTED Final   Cryptococcus neoformans/gattii NOT DETECTED NOT DETECTED Final    Comment: Performed at [facility], [address] [place], [place] [address]  Culture, blood (Routine X 2) w Reflex to ID Panel     Status: None   Collection Time: 09/05/20  3:02 PM   Specimen: BLOOD  Result Value Ref Range Status   Specimen Description BLOOD RIGHT ANTECUBITAL  Final   Special Requests   Final    BOTTLES DRAWN AEROBIC AND ANAEROBIC Blood Culture adequate volume   Culture   Final    NO GROWTH 5 DAYS Performed at [facility], [address] [place], [place] [address]    Report Status 09/10/2020 FINAL  Final  Culture, blood (Routine X 2) w Reflex to ID Panel     Status: None   Collection Time: 09/05/20  3:04 PM   Specimen: BLOOD  Result Value Ref Range Status   Specimen Description BLOOD BLOOD LEFT HAND  Final   Special Requests   Final    BOTTLES DRAWN AEROBIC AND ANAEROBIC Blood Culture adequate volume   Culture   Final    NO GROWTH 5 DAYS Performed at [facility], [address] [place], [place] [address]    Report Status 09/10/2020 FINAL  Final  MRSA PCR Screening     Status: Abnormal   Collection Time: 09/06/20 12:04 PM   Specimen: Nasopharyngeal  Result Value Ref Range Status   MRSA by PCR POSITIVE (A) NEGATIVE Final    Comment:        The GeneXpert MRSA Assay (FDA approved for NASAL specimens only), is one component of a comprehensive MRSA  colonization surveillance program. It is not intended to diagnose MRSA infection nor to guide or monitor treatment for MRSA infections. RESULT CALLED TO, READ BACK BY AND VERIFIED WITH: KAYLA BRITTON AT 1330 ON 09/06/2020 BY SDR. [facility] Performed at [facility], [address], [place], [place] [address]      Radiology Studies: [place] EKG SITE RITE  Result Date: 09/11/2020 If [facility] image not attached, placement could not be confirmed due to current cardiac rhythm.    LOS: 9 days   Lanae Boast, MD [facility]  09/12/2020, 2:11 PM

## 2020-09-12 NOTE — TOC Progression Note (Signed)
Transition of Care [facility]) - Progression Note    Patient Details  Name: Becky Trevino MRN: [id number] Date of Birth: 1945-12-24  [facility]) CM/SW Contact  Maud Deed, LCSW Phone Number: 09/12/2020, 5:16 PM  Clinical Narrative:    CSW was unable to reach Tiffany by phone. TOC will follow up tomorrow about placement. MD holding discharge to monitor renal function.   Expected Discharge Plan: Home w Home Health Services Barriers to Discharge: No Barriers Identified  Expected Discharge Plan and Services Expected Discharge Plan: Home w Home Health Services       Living arrangements for the past 2 months: Single Family Home                                       Social Determinants of Health (SDOH) Interventions    Readmission Risk Interventions No flowsheet data found.

## 2020-09-13 DIAGNOSIS — A419 Sepsis, unspecified organism: Secondary | ICD-10-CM | POA: Diagnosis not present

## 2020-09-13 LAB — BASIC METABOLIC PANEL
Anion gap: 10 (ref 5–15)
BUN: 28 mg/dL — ABNORMAL HIGH (ref 8–23)
CO2: 30 mmol/L (ref 22–32)
Calcium: 9.5 mg/dL (ref 8.9–10.3)
Chloride: 97 mmol/L — ABNORMAL LOW (ref 98–111)
Creatinine, Ser: 1.28 mg/dL — ABNORMAL HIGH (ref 0.44–1.00)
GFR, Estimated: 44 mL/min — ABNORMAL LOW (ref >60)
Glucose, Bld: 90 mg/dL (ref 70–99)
Potassium: 4.2 mmol/L (ref 3.5–5.1)
Sodium: 137 mmol/L (ref 135–145)

## 2020-09-13 LAB — VANCOMYCIN, TROUGH: Vancomycin Tr: 23 ug/mL (ref 15–20)

## 2020-09-13 LAB — MAGNESIUM: Magnesium: 2.2 mg/dL (ref 1.7–2.4)

## 2020-09-13 MED ORDER — VANCOMYCIN HCL IN DEXTROSE 1-5 GM/200ML-% IV SOLN
1000.0000 mg | INTRAVENOUS | Status: DC
  Filled 2020-09-13: qty 200

## 2020-09-13 MED ORDER — LACTATED RINGERS IV SOLN: INTRAVENOUS | Status: DC

## 2020-09-13 NOTE — Progress Notes (Signed)
Pharmacy Antibiotic Note  Becky Trevino is a 74 y.o. female admitted on 09/02/2020 with group B strep bacteremia secondary to cellulitis. Micro called this morning to inform the team that staph aureus had grown on the plate from the 90/24 BCs, it did not register on the BCID and since it has been >24 hours no BCID available. Patient on vancomycin for bacteremias. 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    Today,09/13/2020  D11 antibiotics  SCr = 1.28 stable from yday, mildly increased from previous  Vancomycin trough ordered for this evening (Previous trough = 22 on 1500mg  IV q24h, now on 1240mg  IV q24h)  Vancomycin trough at 1800 = 23 mcg/ml on 1250mg  IV q24h (drawn appropriately before dose)  Plan:  Will decrease to Vancomycin 1000mg  IV q24 at next dosing interval, to be updated in OPAT for plan to discharge soon.    Follow-up renal function  OPAT orders pended   Height: 5\' 11"  (180.3 cm) Weight: (!) 147 kg (324 lb 1.2 oz) IBW/kg (Calculated) : 70.8  Temp (24hrs), Avg:97.9 F (36.6 C), Min:97.6 F (36.4 C), Max:98.3 F (36.8 C)  Recent Labs  Lab 09/07/20 0510 09/08/20 0348 09/09/20 0408 09/10/20 0301 09/10/20 1617 09/12/20 0416 09/13/20 0559 09/13/20 1729  WBC 7.2  --  5.9  --   --  6.7  --   --   CREATININE 1.10* 1.17* 1.18* 1.14*  --  1.28* 1.28*  --   VANCOTROUGH  --   --   --   --  22*  --   --  23*    Estimated Creatinine Clearance: 61.7 mL/min (A) (by C-G formula based on SCr of 1.28 mg/dL (H)).    Allergies  Allergen Reactions  . Lisinopril Swelling    Antimicrobials this admission: 12/19 azithromycin x1 12/19 ceftriaxone >>12/20 12/20 doxycycline >>12/22 12/20 penicillin G >> 12/22 12/22 vancomycin >>   Microbiology results: 12/19 BCx: GBS, MRSA; vanc sensitive  12/19 UCx: proteus mirabilis   12/22 BCx: NGTD 12/23 MRSA PCR: (+)  Thank you for  allowing pharmacy to be a part of this patient's care.  1/23, PharmD, BCPS.   Work Cell: [phone] 09/13/2020 6:19 PM

## 2020-09-13 NOTE — Discharge Summary (Signed)
Physician Discharge Summary  MICHAELA BROSKI VHQ:[id number] DOB: September 01, 1946 DOA: 09/02/2020  PCP: [name], MD  Admit date: 09/02/2020 Discharge date: 09/14/2020  Admitted From: home Disposition:  [place]  Recommendations for Outpatient Follow-up:  1. Follow up with PCP in 1-2 weeks 2. Please obtain BMP/CBC/CK in 3 days and weekly unless otherwise prescribed by ID 3. Please follow up on the following pending results:  Home Health:YES  Equipment/Devices: PICC  Discharge Condition: Stable Code Status:   Code Status: Full Code Diet recommendation:  Diet Order            Diet - low sodium heart healthy           Diet heart healthy/carb modified Room service appropriate? Yes; Fluid consistency: Thin  Diet effective now                  Brief/Interim Summary: 74 year old female with history of DDD/spondylosis of cervical spine, stage III CKD, T2DM, diverticulosis, hyperlipidemia, hypertension,bilateral lower extremity extensive lymphedema bedbound, obesity/OSA, history of syncope, chronic diastolic CHF coming to the ED for second day in a row due to multiple complaints of progressive worsening chills, rigors, fever, frontal headache, lightheadedness, decreased appetite/fatigue/malaise 1 set about a week ago prior to admission. Initially right lower extremity pain after a fall/from wheelchair, no LOC or head injury. As per EMS patient was hypotensive on arrival and given a liter of IV fluids. In the ED work-up showed UA hazy with large hemoglobinuria proteinuria microscopic urine exam shows RBC more than 50 WBC 6-10, WBC count 10.4 with 92% neutrophils, CMP showed normal electrolytes, elevated creatinine 1.5, lactic acid is 2.9. given IV fluids. CXR cardiomegaly with CHF ? Underlying infectious process,-BNP was obtained that was marginally up to 224, pelvis x-ray no acute fracture, RT Hip- end stage degenerative changes. Patient was admitted for AKI, sepsis due to undetermined  etiology. blood cultures showing 3 out of 4 Strep agalactiae ID was consulted and she was continued on IV antibiotics. Patient also had MRSA in one of the blood cultures.  Her x-ray subsequently showed pneumonia, as per ID patient is being treated with IV vancomycin. She has been diuresed for her acute on chronic diastolic CHF anasarca and lymphedema. Seen by PT OT and has advised skilled nursing facility [facility] involved Patient underwent PICC line placement 12/29, will continue on antibiotics through 09/24/2020 CK is slightly up trended 1.3 antibiotic changed to daptomycin, patient unable to go to skilled nursing facility as it is not available (she is willing to go specifin SNF and not considering other place. ) She has opted to go home with home health at this time.  Discharge Diagnoses:   Sepsis POA due to Streptococcus agalactiae/left thigh cellulitis: Sepsis likely from left thigh cellulitis.  Also MRSA in her blood culture.  Echocardiogram with no acute finding, repeat blood cultures on 09/05/20 are  Negative. Chest x-ray repeated  12/26-vascular congestion with diffuse interstitial and left base airspace disease. Appreciate ID input patient will continue antibiotic vancomycin .she had PICC line placed 12/29. Creat at 1.3 slightly up and  abx switched to daptomycin, will need to monitor very closely.If unable to go to skilled nursing facility she will go to home and home health will be set up  Anasarca/lymphedema with acute on chronic diastolic UXL:[id number] compressive therapy, outpatient follow-up.  Continue her home torsemide.  Changed to once a day due to creatinine advised follow-up with BMP and PCP, monitor kidney function closely.    AKI on CKD stage  III a: creatinine 1.52 on presentation but down trended to 0.9 now slowly uptrending- at 1.3- bmp  As OP  SVT/soft blood pressure: She is on midodrine.  She is also on low-dose Toprol to prevent SVT as her heart rate went up to 180s during  this admission  Hypothyroidism continue low-dose Synthroid.  Morbid obesity with BMI 45.  She will benefit with weight loss like lifestyle.    OSA could not tolerate BiPAP here in the hospital continue CPAP at home.  Generalized weakness continue PT OT and rehabilitation.  Hyperlipidemia: Continue Lipitor, aspirin.   Consults:  ID  Subjective: Alert awake oriented no fever. Will discharge home today.  Discharge Exam: Vitals:   09/14/20 0329 09/14/20 0729  BP: (!) 90/52 (!) 99/50  Pulse: 74 78  Resp: 16 16  Temp: 98.3 F (36.8 C) 97.8 F (36.6 C)  SpO2: 96% 98%   General: Pt is alert, morbidly obese, awake, not in acute distress Cardiovascular: RR moderate,S1/S2 +, no rubs, no gallops Respiratory: CTA bilaterally, no wheezing, no rhonchi Abdominal: Soft, NT, ND, bowel sounds + Extremities: no edema, no cyanosis  Discharge Instructions  Discharge Instructions    (HEART FAILURE PATIENTS) Call MD:  Anytime you have any of the following symptoms: 1) 3 pound weight gain in 24 hours or 5 pounds in 1 week 2) shortness of breath, with or without a dry hacking cough 3) swelling in the hands, feet or stomach 4) if you have to sleep on extra pillows at night in order to breathe.   Complete by: As directed    [facility] pharmacist to adjust dose for Vancomycin, Aminoglycosides and other anti-infective therapies as requested by physician.   Complete by: As directed    [facility] to provide Cath Flo 47m   Complete by: As directed    Administer for PICC line occlusion and as ordered by physician for other access device issues.   Anaphylaxis Kit: Provided to treat any anaphylactic reaction to the medication being provided to the patient if First Dose or when requested by physician   Complete by: As directed    Epinephrine 142mml vial / amp: Administer 0.50m55m0.50ml26mubcutaneously once for moderate to severe anaphylaxis, nurse to call physician and pharmacy  when reaction occurs and call 911 if needed for immediate care   Diphenhydramine 50mg42mIV vial: Administer 25-50mg 58mM PRN for first dose reaction, rash, itching, mild reaction, nurse to call physician and pharmacy when reaction occurs   Sodium Chloride 0.9% NS 500ml I65mdminister if needed for hypovolemic blood pressure drop or as ordered by physician after call to physician with anaphylactic reaction   Change dressing on IV access line weekly and PRN   Complete by: As directed    Diet - low sodium heart healthy   Complete by: As directed    Discharge instructions   Complete by: As directed    Please call call MD or return to ER for similar or worsening recurring problem that brought you to hospital or if any fever,nausea/vomiting,abdominal pain, uncontrolled pain, chest pain,  shortness of breath or any other alarming symptoms.  Please follow-up your doctor as instructed in a week time and call the office for appointment.  Check BMP CBC and CK in 3 to 4 days and weekly while on antibiotics  Please avoid alcohol, smoking, or any other illicit substance and maintain healthy habits including taking your regular medications as prescribed.  You were cared for by a hospitalist  during your hospital stay. If you have any questions about your discharge medications or the care you received while you were in the hospital after you are discharged, you can call the unit and ask to speak with the hospitalist on call if the hospitalist that took care of you is not available.  Once you are discharged, your primary care physician will handle any further medical issues. Please note that NO REFILLS for any discharge medications will be authorized once you are discharged, as it is imperative that you return to your primary care physician (or establish a relationship with a primary care physician if you do not have one) for your aftercare needs so that they can reassess your need for medications and monitor  your lab values   Flush IV access with Sodium Chloride 0.9% and Heparin 10 units/ml or 100 units/ml   Complete by: As directed    Home infusion instructions - [facility]   Complete by: As directed    Instructions: Flush IV access with Sodium Chloride 0.9% and Heparin 10units/ml or 100units/ml   Change dressing on IV access line: Weekly and PRN   Instructions Cath Flo 34m: Administer for PICC Line occlusion and as ordered by physician for other access device   [facility] pharmacist to adjust dose for: Vancomycin, Aminoglycosides and other anti-infective therapies as requested by physician   Increase activity slowly   Complete by: As directed    Method of administration may be changed at the discretion of home infusion pharmacist based upon assessment of the patient and/or caregiver's ability to self-administer the medication ordered   Complete by: As directed    No wound care   Complete by: As directed      Allergies as of 09/14/2020      Reactions   Lisinopril Swelling      Medication List    STOP taking these medications   doxycycline 100 MG capsule Commonly known as: VIBRAMYCIN   furosemide 20 MG tablet Commonly known as: LASIX     TAKE these medications   Accu-Chek Guide test strip Generic drug: glucose blood CHECK FASTING BLOOD SUGAR ONCE DAILY AND UP TO 4 TIMES DAILY AS DIRECTED   acetaminophen 650 MG CR tablet Commonly known as: TYLENOL Take 650-1,300 mg by mouth 2 (two) times daily as needed for pain.   aspirin 81 MG EC tablet TAKE 1 TABLET BY MOUTH EVERY DAY   atorvastatin 40 MG tablet Commonly known as: LIPITOR Take 1 tablet (40 mg total) by mouth every evening.   blood glucose meter kit and supplies Dispense based on patient and insurance preference. Use up to four times daily as directed. (FOR ICD-10 E10.9, E11.9).   Calcium Carbonate-Vitamin D 600-400 MG-UNIT tablet Take 1 tablet by mouth 2 (two) times daily. What changed:  Another medication with the same name was added. Make sure you understand how and when to take each.   calcium-vitamin D 500-200 MG-UNIT tablet Commonly known as: OSCAL WITH D Take 1 tablet by mouth 2 (two) times daily. What changed: You were already taking a medication with the same name, and this prescription was added. Make sure you understand how and when to take each.   daptomycin  IVPB Commonly known as: CUBICIN Inject 800 mg into the vein daily for 9 days. Indication: Group B streptococcus and MRSA bacteremia First Dose: Yes Last Day of Therapy:  09/24/2020 Labs - Once weekly (Mondays):  CBC/D, CMP, and CPK Method of administration: IV Push  Method of administration may be changed at the discretion of home infusion pharmacist based upon assessment of the patient and/or caregiver's ability to self-administer the medication ordered. Start taking on: September 15, 2020   fluticasone 50 MCG/ACT nasal spray Commonly known as: FLONASE Place 2 sprays into both nostrils daily.   imipramine 25 MG tablet Commonly known as: TOFRANIL TAKE 1 TABLET BY MOUTH EVERYDAY AT BEDTIME   ketoconazole 2 % cream Commonly known as: NIZORAL Apply 1 application topically 2 (two) times daily as needed (rash).   latanoprost 0.005 % ophthalmic solution Commonly known as: XALATAN SMARTSIG:In Eye(s)   levothyroxine 25 MCG tablet Commonly known as: SYNTHROID Take 1 tablet (25 mcg total) by mouth daily at 6 (six) AM. Start taking on: September 15, 2020   Magnesium Oxide 500 MG Caps Take 500 mg by mouth daily.   metoprolol succinate 25 MG 24 hr tablet Commonly known as: TOPROL-XL Take 1 tablet (25 mg total) by mouth daily.   midodrine 10 MG tablet Commonly known as: PROAMATINE Take 1 tablet (10 mg total) by mouth 3 (three) times daily with meals. What changed:   medication strength  how much to take   mirabegron ER 50 MG Tb24 tablet Commonly known as: Myrbetriq Take 1 tablet (50 mg total) by  mouth daily.   polyethylene glycol 17 g packet Commonly known as: MIRALAX / GLYCOLAX Take 17 g by mouth daily. Start taking on: September 15, 2020   potassium chloride 10 MEQ tablet Commonly known as: KLOR-CON Take 10 mEq by mouth daily.   Simbrinza 1-0.2 % Susp Generic drug: Brinzolamide-Brimonidine Place 1 drop into both eyes 2 (two) times daily.   torsemide 20 MG tablet Commonly known as: DEMADEX Take 1 tablet (20 mg total) by mouth daily.            Discharge Care Instructions  (From admission, onward)         Start     Ordered   09/14/20 0000  Change dressing on IV access line weekly and PRN  (Home infusion instructions - [facility] )        09/14/20 1044          Allergies  Allergen Reactions  . Lisinopril Swelling    The results of significant diagnostics from this hospitalization (including imaging, microbiology, ancillary and laboratory) are listed below for reference.    Microbiology: Recent Results (from the past 240 hour(s))  Culture, blood (Routine X 2) w Reflex to ID Panel     Status: None   Collection Time: 09/05/20  3:02 PM   Specimen: BLOOD  Result Value Ref Range Status   Specimen Description BLOOD RIGHT ANTECUBITAL  Final   Special Requests   Final    BOTTLES DRAWN AEROBIC AND ANAEROBIC Blood Culture adequate volume   Culture   Final    NO GROWTH 5 DAYS Performed at [facility], [address], [place], [place] [address]    Report Status 09/10/2020 FINAL  Final  Culture, blood (Routine X 2) w Reflex to ID Panel     Status: None   Collection Time: 09/05/20  3:04 PM   Specimen: BLOOD  Result Value Ref Range Status   Specimen Description BLOOD BLOOD LEFT HAND  Final   Special Requests   Final    BOTTLES DRAWN AEROBIC AND ANAEROBIC Blood Culture adequate volume   Culture   Final    NO GROWTH 5 DAYS Performed at [facility], [address] [place], [place] [address]    Report Status 09/10/2020 FINAL   Final  MRSA PCR Screening     Status: Abnormal   Collection Time: 09/06/20 12:04 PM   Specimen: Nasopharyngeal  Result Value Ref Range Status   MRSA by PCR POSITIVE (A) NEGATIVE Final    Comment:        The GeneXpert MRSA Assay (FDA approved for NASAL specimens only), is one component of a comprehensive MRSA colonization surveillance program. It is not intended to diagnose MRSA infection nor to guide or monitor treatment for MRSA infections. RESULT CALLED TO, READ BACK BY AND VERIFIED WITH: KAYLA BRITTON AT 1330 ON 09/06/2020 BY SDR. [facility] Performed at [facility], [address] [place], [place] [address]     Procedures/Studies: [place] Chest 2 View  Result Date: 09/02/2020 CLINICAL DATA:  Fever EXAM: CHEST - 2 VIEW COMPARISON:  September 01, 2020 FINDINGS: Again noted is cardiomegaly with prominent interstitial lung markings and findings suggestive of congestive heart failure. The lung volumes are somewhat low. There are probable small bilateral pleural effusions. There are end-stage degenerative changes of the right shoulder. IMPRESSION: Cardiomegaly with persistent congestive heart failure. An underlying atypical infectious process is not entirely excluded. Electronically Signed   By: Constance Holster M.D.   On: 09/02/2020 17:36   DG Pelvis Portable  Result Date: 09/02/2020 CLINICAL DATA:  Fall. EXAM: PORTABLE PELVIS 1-2 VIEWS COMPARISON:  December 11, 2015 FINDINGS: The patient is status post total hip arthroplasty on the left. There are end-stage degenerative changes of the right hip. There is no evidence for an acute displaced fracture or dislocation. IMPRESSION: 1. No acute displaced fracture or dislocation. 2. End-stage degenerative changes of the right hip. Electronically Signed   By: Constance Holster M.D.   On: 09/02/2020 17:35   CT HIP LEFT WO CONTRAST  Result Date: 09/04/2020 CLINICAL DATA:  Septic arthritis EXAM: CT OF THE LEFT HIP WITHOUT CONTRAST TECHNIQUE:  Multidetector CT imaging of the left hip was performed according to the standard protocol. Multiplanar CT image reconstructions were also generated. COMPARISON:  January 09, 2020 FINDINGS: Bones/Joint/Cartilage The patient is status post left total hip arthroplasty. No periprosthetic lucency fracture is identified. No large hip joint effusion is seen. Ligaments Suboptimally assessed by CT. Muscles and Tendons Somewhat limited due to the surrounding streak artifact. No focal atrophy is seen. The hamstrings tendons appear to be intact. Soft tissues There is diffuse subcutaneous edema and skin thickening seen along the anterolateral aspect of the hip. No definite loculated fluid collections are seen. Scattered small lymph nodes seen within the left inguinal region. A moderate amount of colonic stool is present. IMPRESSION: Status post left total hip arthroplasty with findings suggestive of surrounding edema which could be due to diffuse cellulitis surrounding the leg. No definite loculated fluid collections. Electronically Signed   By: Prudencio Pair M.D.   On: 09/04/2020 08:08   DG Chest Port 1 View  Result Date: 09/09/2020 CLINICAL DATA:  Cough. EXAM: PORTABLE CHEST 1 VIEW COMPARISON:  09/02/2020 FINDINGS: Low volume film. The cardio pericardial silhouette is enlarged. Vascular congestion with diffuse interstitial and left base airspace disease is similar to prior. No substantial pleural effusion. Telemetry leads overlie the chest. IMPRESSION: No substantial interval change in exam. Vascular congestion with diffuse interstitial and left base airspace disease. Electronically Signed   By: Misty Stanley M.D.   On: 09/09/2020 07:57   DG Chest Portable 1 View  Result Date: 09/01/2020 CLINICAL DATA:  Chills,  leg pain EXAM: PORTABLE CHEST 1 VIEW COMPARISON:  10/27/2019 FINDINGS: Single frontal view of the chest demonstrates a stable cardiac silhouette. Central vascular congestion without airspace disease, effusion, or  pneumothorax. Chronic elevation of the right hemidiaphragm. No acute bony abnormalities. IMPRESSION: 1. Central vascular congestion without airspace disease. Electronically Signed   By: Randa Ngo M.D.   On: 09/01/2020 21:16   ECHOCARDIOGRAM COMPLETE  Result Date: 09/07/2020    ECHOCARDIOGRAM REPORT   Patient Name:   Becky Trevino Date of Exam: 09/07/2020 Medical Rec #:  [id number]       Height:       71.0 in Accession #:    [id number]      Weight:       280.0 lb Date of Birth:  10-Dec-1945       BSA:          2.433 m Patient Age:    74 years        BP:           108/65 mmHg Patient Gender: F               HR:           89 bpm. Exam Location:  [facility] Procedure: 2D Echo, Color Doppler and Cardiac Doppler Indications:     R78.81 Bacteremia  History:         Patient has prior history of Echocardiogram examinations. CKD;                  Risk Factors:Sleep Apnea, Hypertension, Diabetes and                  Dyslipidemia.  Sonographer:     Charmayne Sheer RDCS (AE) Referring Phys:  [id number] Loletha Grayer Diagnosing Phys: Serafina Royals MD  Sonographer Comments: Suboptimal apical window and no subcostal window. IMPRESSIONS  1. Left ventricular ejection fraction, by estimation, is 60 to 65%. The left ventricle has normal function. The left ventricle has no regional wall motion abnormalities. Left ventricular diastolic parameters were normal.  2. Right ventricular systolic function is normal. The right ventricular size is normal.  3. The mitral valve is normal in structure. Trivial mitral valve regurgitation.  4. The aortic valve is normal in structure. Aortic valve regurgitation is not visualized. FINDINGS  Left Ventricle: Left ventricular ejection fraction, by estimation, is 60 to 65%. The left ventricle has normal function. The left ventricle has no regional wall motion abnormalities. The left ventricular internal cavity size was normal in size. There is  no left ventricular hypertrophy. Left ventricular diastolic  parameters were normal. Right Ventricle: The right ventricular size is normal. No increase in right ventricular wall thickness. Right ventricular systolic function is normal. Left Atrium: Left atrial size was normal in size. Right Atrium: Right atrial size was normal in size. Pericardium: There is no evidence of pericardial effusion. Mitral Valve: The mitral valve is normal in structure. Trivial mitral valve regurgitation. MV peak gradient, 4.0 mmHg. The mean mitral valve gradient is 2.0 mmHg. Tricuspid Valve: The tricuspid valve is normal in structure. Tricuspid valve regurgitation is trivial. Aortic Valve: The aortic valve is normal in structure. Aortic valve regurgitation is not visualized. Aortic valve mean gradient measures 4.0 mmHg. Aortic valve peak gradient measures 8.2 mmHg. Aortic valve area, by VTI measures 3.46 cm. Pulmonic Valve: The pulmonic valve was normal in structure. Pulmonic valve regurgitation is not visualized. Aorta: The aortic root and ascending aorta are structurally normal, with no evidence  of dilitation. IAS/Shunts: No atrial level shunt detected by color flow Doppler.  LEFT VENTRICLE PLAX 2D LVIDd:         3.80 cm  Diastology LVIDs:         2.40 cm  LV e' medial:    5.55 cm/s LV PW:         0.96 cm  LV E/e' medial:  11.1 LV IVS:        0.98 cm  LV e' lateral:   9.68 cm/s LVOT diam:     2.10 cm  LV E/e' lateral: 6.4 LV SV:         86 LV SV Index:   35 LVOT Area:     3.46 cm  LEFT ATRIUM           Index LA diam:      3.00 cm 1.23 cm/m LA Vol (A4C): 30.5 ml 12.54 ml/m  AORTIC VALVE                   PULMONIC VALVE AV Area (Vmax):    2.88 cm    PV Vmax:       1.27 m/s AV Area (Vmean):   3.11 cm    PV Vmean:      89.900 cm/s AV Area (VTI):     3.46 cm    PV VTI:        0.245 m AV Vmax:           143.00 cm/s PV Peak grad:  6.5 mmHg AV Vmean:          96.300 cm/s PV Mean grad:  4.0 mmHg AV VTI:            0.247 m AV Peak Grad:      8.2 mmHg AV Mean Grad:      4.0 mmHg LVOT Vmax:          119.00 cm/s LVOT Vmean:        86.500 cm/s LVOT VTI:          0.247 m LVOT/AV VTI ratio: 1.00  AORTA Ao Root diam: 3.40 cm MITRAL VALVE MV Area (PHT): 6.77 cm    SHUNTS MV Peak grad:  4.0 mmHg    Systemic VTI:  0.25 m MV Mean grad:  2.0 mmHg    Systemic Diam: 2.10 cm MV Vmax:       1.00 m/s MV Vmean:      74.2 cm/s MV Decel Time: 112 msec MV E velocity: 61.70 cm/s MV A velocity: 81.40 cm/s MV E/A ratio:  0.76 Serafina Royals MD Electronically signed by Serafina Royals MD Signature Date/Time: 09/07/2020/10:15:45 AM    Final    [place] EKG SITE RITE  Result Date: 09/11/2020 If Site Rite image not attached, placement could not be confirmed due to current cardiac rhythm.   Labs: BNP (last 3 results) Recent Labs    09/02/20 1542  BNP 003.7*   Basic Metabolic Panel: Recent Labs  Lab 09/09/20 0408 09/10/20 0301 09/12/20 0416 09/13/20 0559 09/14/20 0820  NA 137 136 139 137 137  K 3.7 4.2 4.0 4.2 4.1  CL 103 102 99 97* 96*  CO2 _0 GLUCOSE 87 94 76 90 99  BUN 21 23 26* 28* 28*  CREATININE 1.18* 1.14* 1.28* 1.28* 1.31*  CALCIUM 8.8* 8.6* 9.2 9.5 9.4  MG  --  2.3  --  2.2  --    Liver Function Tests: No results for input(s):  AST, ALT, ALKPHOS, BILITOT, PROT, ALBUMIN in the last 168 hours. No results for input(s): LIPASE, AMYLASE in the last 168 hours. No results for input(s): AMMONIA in the last 168 hours. CBC: Recent Labs  Lab 09/09/20 0408 09/12/20 0416  WBC 5.9 6.7  HGB 10.9* 11.5*  HCT 33.7* 35.2*  MCV 94.7 94.6  PLT 196 253   Cardiac Enzymes: Recent Labs  Lab 09/14/20 0820  CKTOTAL 101   BNP: Invalid input(s): POCBNP CBG: No results for input(s): GLUCAP in the last 168 hours. D-Dimer No results for input(s): DDIMER in the last 72 hours. Hgb A1c No results for input(s): HGBA1C in the last 72 hours. Lipid Profile No results for input(s): CHOL, HDL, LDLCALC, TRIG, CHOLHDL, LDLDIRECT in the last 72 hours. Thyroid function studies No results for input(s):  TSH, T4TOTAL, T3FREE, THYROIDAB in the last 72 hours.  Invalid input(s): FREET3 Anemia work up No results for input(s): VITAMINB12, FOLATE, FERRITIN, TIBC, IRON, RETICCTPCT in the last 72 hours. Urinalysis    Component Value Date/Time   COLORURINE YELLOW (A) 09/02/2020 1725   APPEARANCEUR HAZY (A) 09/02/2020 1725   LABSPEC 1.023 09/02/2020 1725   PHURINE 5.0 09/02/2020 1725   GLUCOSEU NEGATIVE 09/02/2020 1725   HGBUR LARGE (A) 09/02/2020 1725   BILIRUBINUR NEGATIVE 09/02/2020 [place] 09/02/2020 1725   PROTEINUR 30 (A) 09/02/2020 1725   NITRITE NEGATIVE 09/02/2020 1725   LEUKOCYTESUR NEGATIVE 09/02/2020 1725   Sepsis Labs Invalid input(s): PROCALCITONIN,  WBC,  LACTICIDVEN Microbiology Recent Results (from the past 240 hour(s))  Culture, blood (Routine X 2) w Reflex to ID Panel     Status: None   Collection Time: 09/05/20  3:02 PM   Specimen: BLOOD  Result Value Ref Range Status   Specimen Description BLOOD RIGHT ANTECUBITAL  Final   Special Requests   Final    BOTTLES DRAWN AEROBIC AND ANAEROBIC Blood Culture adequate volume   Culture   Final    NO GROWTH 5 DAYS Performed at [facility], [address] [place], [place] [address]    Report Status 09/10/2020 FINAL  Final  Culture, blood (Routine X 2) w Reflex to ID Panel     Status: None   Collection Time: 09/05/20  3:04 PM   Specimen: BLOOD  Result Value Ref Range Status   Specimen Description BLOOD BLOOD LEFT HAND  Final   Special Requests   Final    BOTTLES DRAWN AEROBIC AND ANAEROBIC Blood Culture adequate volume   Culture   Final    NO GROWTH 5 DAYS Performed at [facility], [address] [place], [place] [address]    Report Status 09/10/2020 FINAL  Final  MRSA PCR Screening     Status: Abnormal   Collection Time: 09/06/20 12:04 PM   Specimen: Nasopharyngeal  Result Value Ref Range Status   MRSA by PCR POSITIVE (A) NEGATIVE Final    Comment:        The GeneXpert MRSA  Assay (FDA approved for NASAL specimens only), is one component of a comprehensive MRSA colonization surveillance program. It is not intended to diagnose MRSA infection nor to guide or monitor treatment for MRSA infections. RESULT CALLED TO, READ BACK BY AND VERIFIED WITH: KAYLA BRITTON AT 1330 ON 09/06/2020 BY SDR. [facility] Performed at [facility], [address], [place], [place] [address]      Time coordinating discharge: 35 minutes  SIGNED: Antonieta Pert, MD  [facility] 09/14/2020, 11:13 AM  If 7PM-7AM,  please contact night-coverage status of [email or web address]

## 2020-09-13 NOTE — Progress Notes (Signed)
PROGRESS NOTE    Becky Trevino  [id number] DOB: December 04, 1945 DOA: 09/02/2020 PCP: [name], MD   Chief Complaint  Patient presents with  . Fever  . Fall    Brief Narrative: 74 year old female with history of DDD/spondylosis of cervical spine, stage III CKD, T2DM, diverticulosis, hyperlipidemia, hypertension, bilateral lower extremity extensive lymphedema bedbound, obesity/OSA, history of syncope, chronic diastolic CHF coming to the ED for second day in a row due to multiple complaints of progressive worsening chills, rigors, fever, frontal headache, lightheadedness, decreased appetite/fatigue/malaise 1 set about a week ago prior to admission.  Initially right lower extremity pain after a fall/from wheelchair, no LOC or head injury. As per EMS patient was hypotensive on arrival and given a liter of IV fluids. In the ED work-up showed UA hazy with large hemoglobinuria proteinuria microscopic urine exam shows RBC more than 50 WBC 6-10, WBC count 10.4 with 92% neutrophils, CMP showed normal electrolytes, elevated creatinine 1.5, lactic acid is 2.9. given IV fluids. CXR cardiomegaly with CHF ? Underlying infectious process,-BNP was obtained that was marginally up to 224, pelvis x-ray no acute fracture, RT Hip- end stage degenerative changes. Patient was admitted for AKI, sepsis due to undetermined etiology. blood cultures showing 3 out of 4 Strep agalactiae ID was consulted and she was continued on IV antibiotics. Patient also had MRSA in one of the blood cultures.  Her x-ray subsequently showed pneumonia, as per ID patient is being treated with IV vancomycin. She has been diuresed for her acute on chronic diastolic CHF anasarca and lymphedema. Seen by PT OT and has advised skilled nursing facility TOC involved.   Subjective: Seen this morning.  She is resting comfortably.  No new complaints.  No fever overnight.     Assessment & Plan:  Sepsis POA due to Streptococcus  agalactiae/left thigh cellulitis:Sepsis likely from left thigh cellulitis. Also MRSA in her blood culture.  Echocardiogram with no acute finding, repeat blood cultures on 09/05/20 are  Negative. Chest x-ray repeated  12/26-vascular congestion with diffuse interstitial and left base airspace disease. Appreciate ID input patient will continue antibiotic vancomycin .she had PICC line placed 12/29. Creat at 1.28 slightly up and if has uptrending creatinine may need to switch to daptomycin, will need to monitor very closely.If unable to go to skilled nursing facility she will go to home and home health will be set up  Anasarca/lymphedema with acute on chronic diastolic CHF: Continue compressive therapy, outpatient follow-up.  Continue her home torsemide.  Monitor kidney function closely.    AKI on CKD stage III a: creatinine 1.52 on presentation but down trended to 0.9 now slowly uptrending-holding stable at 1.28.  Monitor closely with vancomycin therapy.  Continue gentle IV fluids while here follow-up Vanco trough. Recent Labs  Lab 09/07/20 0510 09/08/20 0348 09/09/20 0408 09/10/20 0301 09/12/20 0416 09/13/20 0559  BUN 19  --  21 23 26* 28*  CREATININE 1.10* 1.17* 1.18* 1.14* 1.28* 1.28*   SVT/soft blood pressure: She is on midodrine.  She is also on low-dose Toprol to prevent SVT as her heart rate went up to 180s during this admission  Hypothyroidism continue low-dose Synthroid.  Morbid obesity with BMI 45.  She will benefit with weight loss like lifestyle.    OSA could not tolerate BiPAP here in the hospital continue CPAP at home.  Generalized weakness continue PT OT and rehabilitation.  Hyperlipidemia:Continue Lipitor, aspirin.  Diet Order            Diet heart  healthy/carb modified Room service appropriate? Yes; Fluid consistency: Thin  Diet effective now                   Body mass index is 45.2 kg/m.  DVT prophylaxis: lovenox Code Status:   Code Status: Full  Code  Family Communication: plan of care discussed with patient at bedside.  Status is: Inpatient  Remains inpatient appropriate because:Unsafe d/c plan, IV treatments appropriate due to intensity of illness or inability to take PO and Inpatient level of care appropriate due to severity of illness   Dispo: The patient is from: Home              Anticipated d/c is to: SNF if not available then she will go [facility] w/ [facility]              Anticipated d/c date is: 1 day once [facility] set up which can start on 1/1, cna dose iv vanco tomorrow and then d/c home              Patient currently is not medically stable to d/c.  Consultants:see note  Procedures:see note  Culture/Microbiology    Component Value Date/Time   SDES BLOOD BLOOD LEFT HAND 09/05/2020 1504   SPECREQUEST  09/05/2020 1504    BOTTLES DRAWN AEROBIC AND ANAEROBIC Blood Culture adequate volume   CULT  09/05/2020 1504    NO GROWTH 5 DAYS Performed at [facility], [address] [place], [place] [address]    REPTSTATUS 09/10/2020 FINAL 09/05/2020 1504    Other culture-see note  Medications: Scheduled Meds: . aspirin EC  81 mg Oral Daily  . atorvastatin  40 mg Oral QPM  . benzonatate  100 mg Oral TID  . calcium-vitamin D  1 tablet Oral BID  . Chlorhexidine Gluconate Cloth  6 each Topical Daily  . enoxaparin (LOVENOX) injection  0.5 mg/kg Subcutaneous Q24H  . fluticasone  2 spray Each Nare Daily  . imipramine  25 mg Oral QHS  . levothyroxine  25 mcg Oral Q0600  . magnesium oxide  400 mg Oral Daily  . metoprolol succinate  25 mg Oral Daily  . metoprolol tartrate  5 mg Intravenous Once  . midodrine  10 mg Oral TID WC  . mirabegron ER  50 mg Oral Daily  . polyethylene glycol  17 g Oral Daily  . potassium chloride  20 mEq Oral BID  . sodium chloride flush  10-40 mL Intracatheter Q12H  . torsemide  20 mg Oral BID   Continuous Infusions: . sodium chloride 500 mL (09/11/20 1756)  . lactated ringers 30 mL/hr at 09/12/20  1703  . vancomycin 1,250 mg (09/12/20 1826)    Antimicrobials: Anti-infectives (From admission, onward)   Start     Dose/Rate Route Frequency Ordered Stop   09/10/20 1830  vancomycin (VANCOREADY) IVPB 1250 mg/250 mL        1,250 mg 166.7 mL/hr over 90 Minutes Intravenous Every 24 hours 09/10/20 1731     09/07/20 1700  vancomycin (VANCOREADY) IVPB 1500 mg/300 mL  Status:  Discontinued        1,500 mg 150 mL/hr over 120 Minutes Intravenous Every 24 hours 09/07/20 0742 09/10/20 1731   09/06/20 1700  vancomycin (VANCOREADY) IVPB 1750 mg/350 mL  Status:  Discontinued        1,750 mg 175 mL/hr over 120 Minutes Intravenous Every 24 hours 09/06/20 1541 09/07/20 0742   09/06/20 1600  vancomycin (VANCOREADY) IVPB 1500 mg/300 mL  Status:  Discontinued        1,500 mg 150 mL/hr over 120 Minutes Intravenous Every 24 hours 09/05/20 1456 09/06/20 1541   09/05/20 1600  vancomycin (VANCOREADY) IVPB 2000 mg/400 mL        2,000 mg 200 mL/hr over 120 Minutes Intravenous  Once 09/05/20 1456 09/05/20 1712   09/04/20 0000  cefTRIAXone (ROCEPHIN) 2 g in sodium chloride 0.9 % 100 mL IVPB  Status:  Discontinued        2 g 200 mL/hr over 30 Minutes Intravenous Every 24 hours 09/02/20 2230 09/03/20 0754   09/03/20 2000  azithromycin (ZITHROMAX) 500 mg in sodium chloride 0.9 % 250 mL IVPB  Status:  Discontinued        500 mg 250 mL/hr over 60 Minutes Intravenous Every 24 hours 09/02/20 2230 09/03/20 0754   09/03/20 1800  penicillin G potassium 9 Million Units in dextrose 5 % 500 mL continuous infusion  Status:  Discontinued        9 Million Units 41.7 mL/hr over 12 Hours Intravenous Every 12 hours 09/03/20 1558 09/05/20 1456   09/03/20 1300  doxycycline (VIBRA-TABS) tablet 100 mg  Status:  Discontinued        100 mg Oral 2 times daily 09/03/20 1209 09/05/20 1504   09/03/20 1000  penicillin G potassium 4 Million Units in dextrose 5 % 250 mL IVPB  Status:  Discontinued        4 Million Units 250 mL/hr over 60  Minutes Intravenous Every 4 hours 09/03/20 0754 09/03/20 1558   09/02/20 2245  cefTRIAXone (ROCEPHIN) 1 g in sodium chloride 0.9 % 100 mL IVPB        1 g 200 mL/hr over 30 Minutes Intravenous  Once 09/02/20 2230 09/03/20 0045   09/02/20 1915  cefTRIAXone (ROCEPHIN) 1 g in sodium chloride 0.9 % 100 mL IVPB        1 g 200 mL/hr over 30 Minutes Intravenous  Once 09/02/20 1905 09/02/20 2006   09/02/20 1915  azithromycin (ZITHROMAX) 500 mg in sodium chloride 0.9 % 250 mL IVPB        500 mg 250 mL/hr over 60 Minutes Intravenous  Once 09/02/20 1905 09/02/20 2110     Objective: Vitals: Today's Vitals   09/13/20 0729 09/13/20 0939 09/13/20 1115 09/13/20 1230  BP:   (!) 103/56 (!) 101/50  Pulse:   71 74  Resp:   16   Temp:   97.9 F (36.6 C)   TempSrc:      SpO2:   100%   Weight:      Height:      PainSc: 6  0-No pain      Intake/Output Summary (Last 24 hours) at 09/13/2020 1438 Last data filed at 09/13/2020 1255 Gross per 24 hour  Intake 503.23 ml  Output 4050 ml  Net -3546.77 ml   Filed Weights   09/10/20 0949 09/11/20 1326 09/12/20 0616  Weight: (!) 150.8 kg (!) 145.8 kg (!) 147 kg   Weight change:   Intake/Output from previous day: 12/29 0701 - 12/30 0700 In: 503.2 [I.V.:253.2; IV Piggyback:250] Out: 3250 [Urine:3250] Intake/Output this shift: Total I/O In: -  Out: 800 [Urine:800]  Examination: General exam: AAOx3.  Morbidly obese, NAD, weak appearing. HEENT:Oral mucosa moist, Ear/Nose WNL grossly, dentition normal. Respiratory system: bilaterally clear,no wheezing or crackles,no use of accessory muscle Cardiovascular system: S1 & S2 +, No JVD,. Gastrointestinal system: Abdomen soft, NT,ND, BS+ Nervous System:Alert, awake, moving extremities and grossly  nonfocal Extremities: Bilateral extensive lymphedema, distal peripheral pulses palpable.  Skin: No rashes,no icterus. MSK: Normal muscle bulk,tone, power  Data Reviewed: I have personally reviewed following labs  and imaging studies CBC: Recent Labs  Lab 09/07/20 0510 09/09/20 0408 09/12/20 0416  WBC 7.2 5.9 6.7  HGB 10.8* 10.9* 11.5*  HCT 33.7* 33.7* 35.2*  MCV 94.7 94.7 94.6  PLT 158 196 253   Basic Metabolic Panel: Recent Labs  Lab 09/07/20 0510 09/08/20 0348 09/09/20 0408 09/10/20 0301 09/12/20 0416 09/13/20 0559  NA 136  --  137 136 139 137  K 3.9  --  3.7 4.2 4.0 4.2  CL 102  --  103 102 99 97*  CO2 24  --  26 26 30 30   GLUCOSE 91  --  87 94 76 90  BUN 19  --  21 23 26* 28*  CREATININE 1.10* 1.17* 1.18* 1.14* 1.28* 1.28*  CALCIUM 8.9  --  8.8* 8.6* 9.2 9.5  MG  --   --   --  2.3  --  2.2   GFR: Estimated Creatinine Clearance: 61.7 mL/min (A) (by C-G formula based on SCr of 1.28 mg/dL (H)). Liver Function Tests: No results for input(s): AST, ALT, ALKPHOS, BILITOT, PROT, ALBUMIN in the last 168 hours. No results for input(s): LIPASE, AMYLASE in the last 168 hours. No results for input(s): AMMONIA in the last 168 hours. Coagulation Profile: No results for input(s): INR, PROTIME in the last 168 hours. Cardiac Enzymes: No results for input(s): CKTOTAL, CKMB, CKMBINDEX, TROPONINI in the last 168 hours. BNP (last 3 results) No results for input(s): PROBNP in the last 8760 hours. HbA1C: No results for input(s): HGBA1C in the last 72 hours. CBG: No results for input(s): GLUCAP in the last 168 hours. Lipid Profile: No results for input(s): CHOL, HDL, LDLCALC, TRIG, CHOLHDL, LDLDIRECT in the last 72 hours. Thyroid Function Tests: No results for input(s): TSH, T4TOTAL, FREET4, T3FREE, THYROIDAB in the last 72 hours. Anemia Panel: No results for input(s): VITAMINB12, FOLATE, FERRITIN, TIBC, IRON, RETICCTPCT in the last 72 hours. Sepsis Labs: No results for input(s): PROCALCITON, LATICACIDVEN in the last 168 hours.  Recent Results (from the past 240 hour(s))  Culture, blood (Routine X 2) w Reflex to ID Panel     Status: None   Collection Time: 09/05/20  3:02 PM   Specimen:  BLOOD  Result Value Ref Range Status   Specimen Description BLOOD RIGHT ANTECUBITAL  Final   Special Requests   Final    BOTTLES DRAWN AEROBIC AND ANAEROBIC Blood Culture adequate volume   Culture   Final    NO GROWTH 5 DAYS Performed at [facility], [address] [place], [place]    Report Status 09/10/2020 FINAL  Final  Culture, blood (Routine X 2) w Reflex to ID Panel     Status: None   Collection Time: 09/05/20  3:04 PM   Specimen: BLOOD  Result Value Ref Range Status   Specimen Description BLOOD BLOOD LEFT HAND  Final   Special Requests   Final    BOTTLES DRAWN AEROBIC AND ANAEROBIC Blood Culture adequate volume   Culture   Final    NO GROWTH 5 DAYS Performed at [facility], [address] [place], [place]    Report Status 09/10/2020 FINAL  Final  MRSA PCR Screening     Status: Abnormal   Collection Time: 09/06/20 12:04 PM   Specimen: Nasopharyngeal  Result Value Ref Range Status  MRSA by PCR POSITIVE (A) NEGATIVE Final    Comment:        The GeneXpert MRSA Assay (FDA approved for NASAL specimens only), is one component of a comprehensive MRSA colonization surveillance program. It is not intended to diagnose MRSA infection nor to guide or monitor treatment for MRSA infections. RESULT CALLED TO, READ BACK BY AND VERIFIED WITH: KAYLA BRITTON AT 1330 ON 09/06/2020 BY SDR. [facility] Performed at [facility], [address] [place], [place] [address]      Radiology Studies: No results found.   LOS: 10 days   Antonieta Pert, MD [facility]  09/13/2020, 2:38 PM

## 2020-09-13 NOTE — TOC Progression Note (Signed)
Transition of Care [facility]) - Progression Note    Patient Details  Name: Becky Trevino MRN: [id number] Date of Birth: January 12, 1946  Transition of Care [facility]) CM/SW Contact  Trenton Founds, RN Phone Number: 09/13/2020, 9:41 AM  Clinical Narrative:   RNCM called with Tiffany with [facility] to see if they will be able to accept patient, no answer, voice mail left.     Expected Discharge Plan: Home w Home Health Services Barriers to Discharge: No Barriers Identified  Expected Discharge Plan and Services Expected Discharge Plan: Home w Home Health Services       Living arrangements for the past 2 months: Single Family Home                                       Social Determinants of Health (SDOH) Interventions    Readmission Risk Interventions No flowsheet data found.

## 2020-09-13 NOTE — Progress Notes (Signed)
PHARMACY CONSULT NOTE FOR:  OUTPATIENT  PARENTERAL ANTIBIOTIC THERAPY (OPAT)  Indication: Group B streptococcus and MRSA bacteremia Regimen: vancomycin 1250mg  IV q24h End date: 09/24/2020  IV antibiotic discharge orders are pended. To discharging provider:  please sign these orders via discharge navigator,  Select New Orders & click on the button choice - Manage This Unsigned Work.     Thank you for allowing pharmacy to be a part of this patient's care.  11/22/2020, PharmD, BCPS.   Work Cell: [phone] 09/13/2020 1:30 PM

## 2020-09-13 NOTE — TOC Progression Note (Addendum)
Transition of Care [facility]) - Progression Note    Patient Details  Name: Becky Trevino MRN: [id number] Date of Birth: April 29, 1946  Transition of Care [facility]) CM/SW [place], LCSW Phone Number: 09/13/2020, 9:38 AM  Clinical Narrative:   CSW attempted call to [place] at [facility], no answer and left voicemail requesting a return call.   CSW attempted call to [facility] with [facility] to follow up on options for home IV meds.  CSW called into patient's room with update. She says she is not willing to go to any other nursing home other than [facility] (or [facility] or [facility] who are not options due to insurance). Explained that TOC has been unsuccessful in getting an answer from [facility] and we need a plan for if [facility] does not respond or cannot take patient. Patient says she cannot afford copays for [place]. Discussed option of [facility]. Patient defers saying "let's wait until we hear back from [facility]."   10:30- Spoke with Jeannene Patella who reported they could provide home IV meds at home if needed. She reported to her understanding patient's secondary insurance would pick up copays for [place]. CSW reached out to [place] agencies: [facility] (still says $125 copay and 2nd would not pay it), [facility] (left VM), [facility] (not able to take any patients other than traditional Medicare), [facility] (said patient's secondary would pay copays but they do not have staff to take patient), [facility] (checking), [facility] (does not take [employer]).   1:00- Attempted call to [place] at [facility] again. No answer.    1:30- CSW spoke with Pam with [facility]. If needed she could set up [facility] for [facility] but they could only provide help with infusions and start of care would likely not be until start of next week.  2:08- Brittney with [facility] checked with their Business Office. Brittney reported that patient would not  have copays for [place] is patient's primary and [facility] Medicare would cover the copays. Brittney reported their start of care would have to be 1/1 due to staffing. Updated patient on option of Home Health, that CSW has not been able to get an answer from [facility] despite multiple attempts x multiple days. CSW explained that [facility] would come a few times a week and it is not 24 hour care, that patient would need to have support at home. Patient verbalized understanding and reported she would to do this. Updated MD and asked for Home Health orders. Updated Pam with [facility].  4:00- Patient called [facility] herself and they said they have been hesitant to take patient due to her insurance not paying for another patient that was there. Patient said that [facility] said they could possibly take her if she waited until Monday, but no guarantee. Patient said she wants to go to rehab, but does not want to wait until next week then [facility] not take her and Home Health not be able to take her as planned earlier today. Patient said she will think about what she wants to do and let CSW know in the morning. CSW explained that ultimately it is up to MD on when patient is medically ready to DC and if she is medically ready and has a safe DC plan she would need to DC. CSW updated MD and will follow up tomorrow.   Expected  Discharge Plan: Home w Home Health Services Barriers to Discharge: No Barriers Identified  Expected Discharge Plan and Services Expected Discharge Plan: Home w Home Health Services       Living arrangements for the past 2 months: Single Family Home                                       Social Determinants of Health (SDOH) Interventions    Readmission Risk Interventions No flowsheet data found.

## 2020-09-13 NOTE — Plan of Care (Signed)
  Problem: Clinical Measurements: Goal: Ability to maintain clinical measurements within normal limits will improve Outcome: Progressing Goal: Diagnostic test results will improve Outcome: Progressing Goal: Respiratory complications will improve Outcome: Progressing Goal: Cardiovascular complication will be avoided Outcome: Progressing   

## 2020-09-13 NOTE — Treatment Plan (Deleted)
Diagnosis: MRSA bacteremia, GBS bacteremia, pneumonia, cellulitis Baseline Creatinine 1.28    Allergies  Allergen Reactions  . Lisinopril Swelling    OPAT Orders Discharge antibiotics: Vancomycin 1250 mg IV every 24 hrs Per pharmacy protocol Aim for Vancomycin trough 15-20 (unless otherwise indicated) End Date: 09/24/20  [facility] Care Per Protocol:  Labs weekly on Mondaywhile on IV antibiotics: _X_ CBC with differential _X_ CMP _X_ Vancomycin trough  LABS on Thursday XVAnco trough XBMP Please pull PIC at completion of IV antibiotics on 09/24/20   Fax weekly labs to [phone]  Clinic Follow Up Appt: 09/21/19 at 12   Call [phone] with questions

## 2020-09-13 NOTE — Progress Notes (Signed)
Pharmacy Antibiotic Note  Becky Trevino is a 74 y.o. female admitted on 09/02/2020 with group B strep bacteremia secondary to cellulitis. Micro called this morning to inform the team that staph aureus had grown on the plate from the 12/75 BCs, it did not register on the BCID and since it has been >24 hours no BCID available. Patient on vancomycin for bacteremias. 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    Today,09/13/2020  D11 antibiotics  SCr = 1.28 stable from yday, mildly increased from previous  Vancomycin trough ordered for this evening (Previous trough = 22 on 1500mg  IV q24h, now on 1240mg  IV q24h)  Vancomycin trough at 1800 = ___ mcg/ml on 1250mg  IV q24h  Plan:  Vancomycin 1250mg  IV q24 with trough ordered for tonight to confirm dosing as plan is to discharge soon.    Follow up vancomycin trough at 18:00  Follow-up renal function  OPAT orders pended   Height: 5\' 11"  (180.3 cm) Weight: (!) 147 kg (324 lb 1.2 oz) IBW/kg (Calculated) : 70.8  Temp (24hrs), Avg:98 F (36.7 C), Min:97.6 F (36.4 C), Max:98.4 F (36.9 C)  Recent Labs  Lab 09/07/20 0510 09/08/20 0348 09/09/20 0408 09/10/20 0301 09/10/20 1617 09/12/20 0416 09/13/20 0559  WBC 7.2  --  5.9  --   --  6.7  --   CREATININE 1.10* 1.17* 1.18* 1.14*  --  1.28* 1.28*  VANCOTROUGH  --   --   --   --  22*  --   --     Estimated Creatinine Clearance: 61.7 mL/min (A) (by C-G formula based on SCr of 1.28 mg/dL (H)).    Allergies  Allergen Reactions  . Lisinopril Swelling    Antimicrobials this admission: 12/19 azithromycin x1 12/19 ceftriaxone >>12/20 12/20 doxycycline >>12/22 12/20 penicillin G >> 12/22 12/22 vancomycin >>   Microbiology results: 12/19 BCx: GBS, MRSA; vanc sensitive  12/19 UCx: proteus mirabilis   12/22 BCx: NGTD 12/23 MRSA PCR: (+)  Thank you for allowing pharmacy to be a part of this  patient's care.  1/23, PharmD, BCPS.   Work Cell: [phone] 09/13/2020 3:44 PM

## 2020-09-14 DIAGNOSIS — A419 Sepsis, unspecified organism: Secondary | ICD-10-CM | POA: Diagnosis not present

## 2020-09-14 LAB — CK: Total CK: 101 U/L (ref 38–234)

## 2020-09-14 LAB — BASIC METABOLIC PANEL
Anion gap: 12 (ref 5–15)
BUN: 28 mg/dL — ABNORMAL HIGH (ref 8–23)
CO2: 29 mmol/L (ref 22–32)
Calcium: 9.4 mg/dL (ref 8.9–10.3)
Chloride: 96 mmol/L — ABNORMAL LOW (ref 98–111)
Creatinine, Ser: 1.31 mg/dL — ABNORMAL HIGH (ref 0.44–1.00)
GFR, Estimated: 43 mL/min — ABNORMAL LOW (ref >60)
Glucose, Bld: 99 mg/dL (ref 70–99)
Potassium: 4.1 mmol/L (ref 3.5–5.1)
Sodium: 137 mmol/L (ref 135–145)

## 2020-09-14 MED ORDER — DAPTOMYCIN IV (FOR PTA / DISCHARGE USE ONLY): 800.0000 mg | INTRAVENOUS | 0 refills | Status: AC

## 2020-09-14 MED ORDER — LEVOTHYROXINE SODIUM 25 MCG PO TABS: 25.0000 ug | ORAL_TABLET | Freq: Every day | ORAL | 0 refills | Status: DC

## 2020-09-14 MED ORDER — POLYETHYLENE GLYCOL 3350 17 G PO PACK: 17.0000 g | PACK | Freq: Every day | ORAL | 0 refills | Status: DC

## 2020-09-14 MED ORDER — CALCIUM CARBONATE-VITAMIN D 500-200 MG-UNIT PO TABS: 1.0000 | ORAL_TABLET | Freq: Two times a day (BID) | ORAL | 0 refills | Status: DC

## 2020-09-14 MED ORDER — SODIUM CHLORIDE 0.9 % IV SOLN
800.0000 mg | Freq: Every day | INTRAVENOUS | Status: DC
  Administered 2020-09-14: 800 mg via INTRAVENOUS
  Filled 2020-09-14: qty 16

## 2020-09-14 MED ORDER — MIDODRINE HCL 10 MG PO TABS: 10.0000 mg | ORAL_TABLET | Freq: Three times a day (TID) | ORAL | 0 refills | Status: AC

## 2020-09-14 MED ORDER — TORSEMIDE 20 MG PO TABS
20.0000 mg | ORAL_TABLET | Freq: Every day | ORAL | 0 refills | Status: DC
Start: 2020-09-14 — End: 2020-11-14

## 2020-09-14 NOTE — Progress Notes (Signed)
Pt discharged home via EMS.  Discharge packet given to EMS. Home with Home infusions.  Picc line in place, clean, dry and intact. No s/s of distress, VSS.

## 2020-09-14 NOTE — Progress Notes (Signed)
PHARMACY CONSULT NOTE FOR:  OUTPATIENT  PARENTERAL ANTIBIOTIC THERAPY (OPAT)  Indication: Group B streptococcus and MRSA bacteremia Regimen: Daptomycin 800mg  IV q24h (stop vancomycin)  End date: 09/24/2020  IV antibiotic discharge orders are pended. To discharging provider:  please sign these orders via discharge navigator,  Select New Orders & click on the button choice - Manage This Unsigned Work.     Thank you for allowing pharmacy to be a part of this patient's care.  11/22/2020, PharmD, BCPS.   Work Cell: [phone] 09/14/2020 10:00 AM

## 2020-09-14 NOTE — Progress Notes (Addendum)
Pharmacy Antibiotic Note  Becky Trevino is a 74 y.o. female admitted on 09/02/2020 with group B strep bacteremia secondary to cellulitis. Micro called this morning to inform the team that staph aureus had grown on the plate from the 29/56 BCs, it did not register on the BCID and since it has been >24 hours no BCID available. Patient on vancomycin for bacteremias. 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    Today,09/14/2020  D12 antibiotics  SCr = 1.31 slight trend upward with two supratherapeutic troughs (as below)  Vancomycin trough ordered for this evening (Previous trough = 22 on 1500mg  IV q24h, now on 1240mg  IV q24h)  Vancomycin trough at 1800 = 23 mcg/ml on 1250mg  IV q24h (drawn appropriately before dose)  Plan:  Per discussion with ID, per renal function and supratherapeutic troughs will change vancomycin to daptomycin 800mg  (8mg /kg) IV q24h (using adjusted BW)  CK = 101, watch as patient on atorvastatin 40mg  as well  Follow renal function   See updated OPAT Orders.      Height: 5\' 11"  (180.3 cm) Weight: (!) 144.5 kg (318 lb 9 oz) IBW/kg (Calculated) : 70.8  Temp (24hrs), Avg:98 F (36.7 C), Min:97.8 F (36.6 C), Max:98.4 F (36.9 C)  Recent Labs  Lab 09/09/20 0408 09/10/20 0301 09/10/20 1617 09/12/20 0416 09/13/20 0559 09/13/20 1729 09/14/20 0820  WBC 5.9  --   --  6.7  --   --   --   CREATININE 1.18* 1.14*  --  1.28* 1.28*  --  1.31*  VANCOTROUGH  --   --  22*  --   --  23*  --     Estimated Creatinine Clearance: 59.7 mL/min (A) (by C-G formula based on SCr of 1.31 mg/dL (H)).    Allergies  Allergen Reactions  . Lisinopril Swelling    Antimicrobials this admission: 12/19 azithromycin x1 12/19 ceftriaxone >>12/20 12/20 doxycycline >>12/22 12/20 penicillin G >> 12/22 12/22 vancomycin >>   Microbiology results: 12/19 BCx: GBS, MRSA; vanc sensitive  12/19  UCx: proteus mirabilis   12/22 BCx: NGTD 12/23 MRSA PCR: (+)  Thank you for allowing pharmacy to be a part of this patient's care.  1/21, PharmD, BCPS.   Work Cell: [phone] 09/14/2020 9:42 AM

## 2020-09-14 NOTE — Plan of Care (Signed)

## 2020-09-14 NOTE — TOC Transition Note (Addendum)
Transition of Care [facility]) - CM/SW Discharge Note   Patient Details  Name: Becky Trevino MRN: [id number] Date of Birth: 09/13/1946  Transition of Care [facility]) CM/SW Contact:  Liliana Cline, LCSW Phone Number: 09/14/2020, 3:04 PM   Clinical Narrative:   Patient discharge home today with [facility] RN, PT, OT, Aide. Home IV medications through [facility]. Representatives [place] and Pam are aware of DC.    Final next level of care: Home w Home Health Services Barriers to Discharge: Barriers Resolved   Patient Goals and CMS Choice Patient states their goals for this hospitalization and ongoing recovery are:: home with home health, home iv meds CMS Medicare.gov Compare Post Acute Care list provided to:: Patient Choice offered to / list presented to : Patient  Discharge Placement                Patient to be transferred to facility by: EMS per patient request/need Name of family member notified: patient aware. Patient and family notified of of transfer: 09/14/20  Discharge Plan and Services                          HH Arranged: RN,PT,OT,Nurse's Aide [facility] Agency: [facility] Date [facility] Agency Contacted: 09/14/20   Representative spoke with at [facility] Agency: [place]  Social Determinants of Health (SDOH) Interventions     Readmission Risk Interventions No flowsheet data found.

## 2020-09-14 NOTE — TOC Progression Note (Addendum)
Transition of Care [facility]) - Progression Note    Patient Details  Name: Becky Trevino MRN: [id number] Date of Birth: 1946/07/16  Transition of Care [facility]) CM/SW Contact  Liliana Cline, LCSW Phone Number: 09/14/2020, 9:23 AM  Clinical Narrative:   Received call from patient. Patient reported she does not think she has the help she needs at home, so now wants to wait until Monday to see if [facility] will take her. Spoke with [facility] Supervisor then explained to patient that if she is medically ready for DC and we have a safe DC plan, she could not wait for a specific SNF as insurance may not cover her continued hospital stay. Explained that patient would need to be agreeable to other SNFs, she said CSW can send it to [facility] in [place]. Explained that per [facility] Supervisor, patient needs to agree to sending to more than 1 other SNF so we can find a bed especially due to the holiday. Patient says she is not agreeable to going anywhere else. Patient says if [facility] or [facility] cannot take her before Monday, she would agree to original plan of Home Health today. [facility] not accepting patients today or this weekend due to holiday. CSW attempted call to [facility], Tiffany who is covering Admissions is out today. Left voicemails for [place]. Called back to patient and she is agreeable to waiting until 11am, if no response from [facility] by then, she is ok with home with home health today. Updated Brittney with [facility] and Pam with [facility]. Asked MD to put in Home Health orders per [place] to hold patient's spot, can cancel if needed.  11:08- No answer from [place]. Called patient. Patient agreeable to home with home health/home IV meds. Updated Pam ([facility]), [place] ([facility]), MD, and RN. Patient reported she needs EMS transport home, she is aware she may be financially responsible. CSW will arrange for EMS when informed by RN that patient is  ready for DC.  1:00- Informed by RN patient is ready for EMS to be called. [facility]. Patient is next on their list and they have a truck available. Updated RN.  Expected Discharge Plan: Home w Home Health Services Barriers to Discharge: No Barriers Identified  Expected Discharge Plan and Services Expected Discharge Plan: Home w Home Health Services       Living arrangements for the past 2 months: Single Family Home                                       Social Determinants of Health (SDOH) Interventions    Readmission Risk Interventions No flowsheet data found.

## 2020-09-14 NOTE — Treatment Plan (Signed)
Diagnosis: MRSA bacteremia GBS streptococcus bacteremia    Allergies  Allergen Reactions  . Lisinopril Swelling    OPAT Orders DAptomycin 800mg  IV every 24 hours End Date: 09/24/20  [facility] Care Per Protocol:  Labs weekly while on IV antibiotics: _X_ CBC with differential __ BMP _X_ CMP X cpk  _X_ Please pull PIC at completion of IV antibiotics  Fax weekly labs to [phone]  Clinic Follow Up Appt: 09/20/20   Call [phone] with any questions

## 2020-09-15 ENCOUNTER — Other Ambulatory Visit: Payer: Self-pay | Admitting: Urology

## 2020-09-15 ENCOUNTER — Other Ambulatory Visit: Payer: Self-pay | Admitting: Family Medicine

## 2020-09-15 DIAGNOSIS — A408 Other streptococcal sepsis: Secondary | ICD-10-CM | POA: Diagnosis not present

## 2020-09-15 DIAGNOSIS — R7881 Bacteremia: Secondary | ICD-10-CM | POA: Diagnosis not present

## 2020-09-15 DIAGNOSIS — I10 Essential (primary) hypertension: Secondary | ICD-10-CM

## 2020-09-16 DIAGNOSIS — A408 Other streptococcal sepsis: Secondary | ICD-10-CM | POA: Diagnosis not present

## 2020-09-16 DIAGNOSIS — R7881 Bacteremia: Secondary | ICD-10-CM | POA: Diagnosis not present

## 2020-09-17 ENCOUNTER — Telehealth: Payer: Self-pay | Admitting: Family Medicine

## 2020-09-17 ENCOUNTER — Telehealth: Payer: Self-pay

## 2020-09-17 ENCOUNTER — Other Ambulatory Visit: Payer: Self-pay | Admitting: Family Medicine

## 2020-09-17 ENCOUNTER — Other Ambulatory Visit: Payer: Self-pay

## 2020-09-17 DIAGNOSIS — A408 Other streptococcal sepsis: Secondary | ICD-10-CM | POA: Diagnosis not present

## 2020-09-17 DIAGNOSIS — R7881 Bacteremia: Secondary | ICD-10-CM | POA: Diagnosis not present

## 2020-09-17 NOTE — Telephone Encounter (Signed)
*  [facility] said that [facility] not [facility] her [facility] for metoprolol please resend. *Also, stated that when she was in the hospital they have her liquid cough medication and she is requesting that you give her a prescription for it. She does not remember the name of it.  *requesting a return call she has a question about the potassium

## 2020-09-17 NOTE — Telephone Encounter (Signed)
Called pt and gave results

## 2020-09-17 NOTE — Telephone Encounter (Signed)
Transition Care Management Follow-up Telephone Call  Date of discharge and from where: 09/14/20 [facility]  How have you been since you were released from the hospital? Pt states she is doing okay; c/o dry cough & requests cough syrup rx but does not recall what she was given in the hospital. She is taking OTC robitussin at home but states it does not help. Pt did not have any coughing c/o congestion during phone conversation. Pt denies SOB or pain.   Any questions or concerns? No  Items Reviewed:  Did the pt receive and understand the discharge instructions provided? Yes   Medications obtained and verified? Yes   Other? No   Any new allergies since your discharge? No   Dietary orders reviewed? Yes  Do you have support at home? Yes   Home Care and Equipment/Supplies: Were home health services ordered? yes If so, what is the name of the agency? [facility]  Has the agency set up a time to come to the patient's home? Yes 1st visit 09/15/20 Were any new equipment or medical supplies ordered?  Yes: IV antibiotics What is the name of the medical supply agency? [facility] Were you able to get the supplies/equipment? yes Do you have any questions related to the use of the equipment or supplies? No  Functional Questionnaire: (I = Independent and D = Dependent) ADLs: I   Bathing/Dressing- I  Meal Prep- D  Eating- I  Maintaining continence- I  Transferring/Ambulation- I with walker and/or wheelchair  Managing Meds- I   Follow up appointments reviewed:   PCP Hospital f/u appt confirmed? Yes  Scheduled to see Dr. Carlynn Purl on 09/28/20.  Specialist Hospital f/u appt confirmed? Yes  Scheduled to see Dr. Rivka Safer on 09/20/20.  Are transportation arrangements needed? Yes  - pt set up already  If their condition worsens, is the pt aware to call PCP or go to the Emergency Dept.? Yes  Was the patient provided with contact information for the PCP's office or ED? Yes  Was to pt encouraged to  call back with questions or concerns? Yes

## 2020-09-17 NOTE — Telephone Encounter (Signed)
Pt states she still has a cough from when she had pneumonia.  That is why she is asking for a refill on the cough rx.  CB#  [phone]

## 2020-09-18 ENCOUNTER — Other Ambulatory Visit: Payer: Self-pay | Admitting: Family Medicine

## 2020-09-18 DIAGNOSIS — A408 Other streptococcal sepsis: Secondary | ICD-10-CM | POA: Diagnosis not present

## 2020-09-18 DIAGNOSIS — R7881 Bacteremia: Secondary | ICD-10-CM | POA: Diagnosis not present

## 2020-09-18 MED ORDER — BENZONATATE 100 MG PO CAPS: 100.0000 mg | ORAL_CAPSULE | Freq: Two times a day (BID) | ORAL | 0 refills | Status: DC | PRN

## 2020-09-19 DIAGNOSIS — A408 Other streptococcal sepsis: Secondary | ICD-10-CM | POA: Diagnosis not present

## 2020-09-19 DIAGNOSIS — R7881 Bacteremia: Secondary | ICD-10-CM | POA: Diagnosis not present

## 2020-09-20 ENCOUNTER — Ambulatory Visit: Payer: BC Managed Care – PPO | Attending: Infectious Diseases | Admitting: Infectious Diseases

## 2020-09-20 ENCOUNTER — Other Ambulatory Visit: Payer: Self-pay

## 2020-09-20 DIAGNOSIS — L03116 Cellulitis of left lower limb: Secondary | ICD-10-CM | POA: Diagnosis not present

## 2020-09-20 DIAGNOSIS — B951 Streptococcus, group B, as the cause of diseases classified elsewhere: Secondary | ICD-10-CM | POA: Diagnosis not present

## 2020-09-20 DIAGNOSIS — A408 Other streptococcal sepsis: Secondary | ICD-10-CM | POA: Diagnosis not present

## 2020-09-20 DIAGNOSIS — R7881 Bacteremia: Secondary | ICD-10-CM

## 2020-09-20 NOTE — Progress Notes (Signed)
The purpose of this virtual visit is to provide medical care while limiting exposure to the novel coronavirus (COVID19) for both patient and office staff.   Consent was obtained for phone visit:  Yes.   Answered questions that patient had about telehealth interaction:  Yes.   I discussed the limitations, risks, security and privacy concerns of performing an evaluation and management service by telephone. I also discussed with the patient that there may be a patient responsible charge related to this service. The patient expressed understanding and agreed to proceed.   Patient Location: Home Provider Location:office People on the call- 2 - patient and provider Follow up to recent hospitalization Pt was in [facility] between 09/02/2020 until 12/31 /21She had left upper thigh cellulitis.  She had a MRSA bacteria and 1 out of 4 blood culture and group B streptococcus in the blood cultures as well.  There was also concern for left lower lobe infiltrate MRSA nares.  She received vancomycin until 09/14/2020.  Her respiratory symptoms resolved. Because of creatinine creeping up gradually she was switched to daptomycin on discharge to continue for another 10 days.  Her last date for IV infusion is 09/24/2020. Patient is doing fine.  She says there is no pain in her thigh.  The swelling is improved.  She is doing physical therapy at home.  He has no side effects from medications.  Labs were drawn on 09/19/2020 and I do not have the results with me. As the bioburden of MRSA was minimal we did not pursue with TEE.  2D echo was okay.  Repeat blood cultures were negative. Patient has severe lymphedema both legs.  She needs occupational therapy and compressive therapy. Diagnosis Left upper thigh cellulitis MRSA bacteremia Group B streptococcus bacteremia MRSA pneumonia  Right popliteal DVT status post filter Severe obesity Left THA Right TKA with prosthetic joint infection with coag negative staph in 2017 And she  was on suppressive doxy therapy after initial IV antibiotics. She will continue the doxycycline once the daptomycin is finished. PICC will be removed once she completes antibiotic.  Discussed the management with the patient will follow her as needed. Total time spent 7 minutes.

## 2020-09-21 ENCOUNTER — Telehealth: Payer: Self-pay

## 2020-09-21 DIAGNOSIS — L03115 Cellulitis of right lower limb: Secondary | ICD-10-CM | POA: Diagnosis not present

## 2020-09-21 DIAGNOSIS — A408 Other streptococcal sepsis: Secondary | ICD-10-CM | POA: Diagnosis not present

## 2020-09-21 DIAGNOSIS — R7881 Bacteremia: Secondary | ICD-10-CM | POA: Diagnosis not present

## 2020-09-21 NOTE — Telephone Encounter (Signed)
Pt is requesting a prescription for a lift chair. She would like to get it from [facility]

## 2020-09-22 DIAGNOSIS — R7881 Bacteremia: Secondary | ICD-10-CM | POA: Diagnosis not present

## 2020-09-22 DIAGNOSIS — A408 Other streptococcal sepsis: Secondary | ICD-10-CM | POA: Diagnosis not present

## 2020-09-23 DIAGNOSIS — A408 Other streptococcal sepsis: Secondary | ICD-10-CM | POA: Diagnosis not present

## 2020-09-23 DIAGNOSIS — R7881 Bacteremia: Secondary | ICD-10-CM | POA: Diagnosis not present

## 2020-09-24 DIAGNOSIS — R7881 Bacteremia: Secondary | ICD-10-CM | POA: Diagnosis not present

## 2020-09-24 DIAGNOSIS — A408 Other streptococcal sepsis: Secondary | ICD-10-CM | POA: Diagnosis not present

## 2020-09-26 DIAGNOSIS — R2681 Unsteadiness on feet: Secondary | ICD-10-CM | POA: Diagnosis not present

## 2020-09-26 DIAGNOSIS — L03115 Cellulitis of right lower limb: Secondary | ICD-10-CM | POA: Diagnosis not present

## 2020-09-26 DIAGNOSIS — E1140 Type 2 diabetes mellitus with diabetic neuropathy, unspecified: Secondary | ICD-10-CM | POA: Diagnosis not present

## 2020-09-28 ENCOUNTER — Encounter: Payer: Self-pay | Admitting: Family Medicine

## 2020-09-28 ENCOUNTER — Ambulatory Visit (INDEPENDENT_AMBULATORY_CARE_PROVIDER_SITE_OTHER): Payer: BC Managed Care – PPO | Admitting: Family Medicine

## 2020-09-28 VITALS — BP 132/84 | HR 69 | Temp 99.0°F | Resp 16 | Ht 71.0 in | Wt 318.5 lb

## 2020-09-28 DIAGNOSIS — Z09 Encounter for follow-up examination after completed treatment for conditions other than malignant neoplasm: Secondary | ICD-10-CM | POA: Diagnosis not present

## 2020-09-28 DIAGNOSIS — R7881 Bacteremia: Secondary | ICD-10-CM

## 2020-09-28 DIAGNOSIS — L03115 Cellulitis of right lower limb: Secondary | ICD-10-CM | POA: Diagnosis not present

## 2020-09-28 DIAGNOSIS — B9562 Methicillin resistant Staphylococcus aureus infection as the cause of diseases classified elsewhere: Secondary | ICD-10-CM

## 2020-09-28 DIAGNOSIS — J849 Interstitial pulmonary disease, unspecified: Secondary | ICD-10-CM

## 2020-09-28 DIAGNOSIS — L03116 Cellulitis of left lower limb: Secondary | ICD-10-CM

## 2020-09-28 DIAGNOSIS — E1140 Type 2 diabetes mellitus with diabetic neuropathy, unspecified: Secondary | ICD-10-CM | POA: Diagnosis not present

## 2020-09-28 DIAGNOSIS — E039 Hypothyroidism, unspecified: Secondary | ICD-10-CM

## 2020-09-28 LAB — POCT GLYCOSYLATED HEMOGLOBIN (HGB A1C): Hemoglobin A1C: 6.1 % — AB (ref 4.0–5.6)

## 2020-09-28 NOTE — Progress Notes (Signed)
Name: Becky Trevino   MRN: [id number]    DOB: 1945/11/20   Date:09/28/2020       Progress Note  Subjective  Chief Complaint  Follow Up She came in with caregiver - Becky Trevino - private aid   [facility] Discharge Follow up - admitted to [facility]  Admission date: 09/02/2020 Discharge date : 09/14/2020   Becky Trevino went to [facility] for evaluation of chills, fatigue, dizziness, change in appetite  and fever going on for a couple of days prior to 09/12/2020. She was transported by EMS and given fluids prior to arrival to [facility]. She was found to have cellulitis on left thigh streptococcus  ( she has decrease in sensation on left thigh due to peroneal nerve damage) . During her stay she was found to have pneumonia and MRSA on her blood culture. She had a flare of CHF and was given demadex. She also had mild drop of her kidney function. During her hospital stay metoprolol was placed on hold and heart rate spiked to 180 - SVT, she is back on beta blocker and because of history of hypotension she is still on midodrine. She was diagnosed with hypothyroidism and is now taking levothyroxine 25 mcg daily and we will recheck level in 4 weeks. .   Since she went home she has been getting IV infusion through home health. She had and appointment with ID on 09/20/2020  She has a sitter with her for most the day, she has difficulty getting up from her chair and needs a lift chair. We will write a rx today.  Reviewed medications with patient.  Reviewed labs and tests/notes from hospital stay.  Answered questions  Patient Active Problem List   Diagnosis Date Noted  . Anasarca   . Acute on chronic diastolic CHF (congestive heart failure) ([place])   . Weakness   . Hypothyroidism   . SVT (supraventricular tachycardia) ([place])   . Sepsis due to Streptococcus agalactiae ([place])   . Acute kidney injury superimposed on CKD ([place])   . Hypotension   . Class 2 obesity without serious comorbidity with body mass index (BMI) of 39.0  to 39.9 in adult   . Sepsis ([place]) 09/03/2020  . Sepsis due to undetermined organism ([place]) 09/02/2020  . Thrombocytopenia ([place])   . Mild protein-calorie malnutrition ([place])   . Hyperbilirubinemia   . Injury of left femoral nerve 08/28/2020  . History of DVT (deep vein thrombosis) 06/25/2020  . Lymphedema 06/25/2020  . History of pelvic hematoma 02/10/2020  . Foot ulcer, right, limited to breakdown of skin ([place]) 12/02/2019  . MRSA bacteremia 10/30/2019  . Chronic venous hypertension due to deep vein thrombosis (DVT) 10/30/2019  . Hematoma   . Normocytic anemia 08/07/2019  . Chronic infection of right knee ([place]) 11/25/2018  . Infection and inflammatory reaction due to internal joint prosthesis, subsequent encounter 01/25/2018  . Infection of total right knee replacement ([place]) 11/19/2017  . Type 2 diabetes mellitus with hyperlipidemia ([place]) 11/06/2016  . Primary osteoarthritis of knee 03/11/2016  . Primary osteoarthritis of left hip 12/11/2015  . Benign essential HTN 04/21/2015  . Edema leg 04/21/2015  . Chronic kidney disease (CKD), stage III (moderate) ([place]) 04/21/2015  . Chronic venous insufficiency 04/21/2015  . DDD (degenerative disc disease), cervical 04/21/2015  . Diabetes mellitus with renal manifestation ([place]) 04/21/2015  . Hyperlipidemia 04/21/2015  . Bladder cystocele 04/21/2015  . Urinary incontinence 04/21/2015  . Leg varices 04/21/2015  . Insomnia 04/21/2015  . Eczema  intertrigo 04/21/2015  . Obesity, Class III, BMI 40-49.9 (morbid obesity) ([place]) 04/21/2015  . OSA (obstructive sleep apnea) 04/21/2015  . Menopausal and perimenopausal disorder 04/21/2015  . Supraventricular premature beats 04/21/2015  . Osteoarthritis of multiple joints 04/21/2015  . H/O Spinal surgery 11/14/2013  . Detrusor muscle hypertonia 11/07/2013    Past Surgical History:  Procedure Laterality Date  . ABDOMINAL HYSTERECTOMY  1993   Total  . BUNIONECTOMY Bilateral 1993  . I & D KNEE WITH  POLY EXCHANGE Right 11/19/2017   Procedure: RIGHT KNEE POLY EXCHANGE WITH IRRIGATION AND DEBRIDEMENT;  Surgeon: Hessie Knows, MD;  Location: [facility] ORS;  Service: Orthopedics;  Laterality: Right;  . JOINT REPLACEMENT     bilateral knee  . LAMINECTOMY  11/14/2013    Cervical Fusion , [facility], Dr. Delilah Shan  . LASER ABLATION Bilateral 07/29/2012   Dr. Lucky Cowboy  . LOWER EXTREMITY ANGIOGRAPHY Right 11/02/2019   Procedure: Lower Extremity Angiography;  Surgeon: Katha Cabal, MD;  Location: [place] CV LAB;  Service: Cardiovascular;  Laterality: Right;  . ORIF ANKLE FRACTURE Right 09/02/2019   Procedure: OPEN REDUCTION INTERNAL FIXATION (ORIF) ANKLE FRACTURE BIMALLEOLAR;  Surgeon: [name], DPM;  Location: [facility] ORS;  Service: Podiatry;  Laterality: Right;  . PERIPHERAL VASCULAR THROMBECTOMY Right 03/02/2020   Procedure: PERIPHERAL VASCULAR THROMBECTOMY;  Surgeon: Algernon Huxley, MD;  Location: [place] CV LAB;  Service: Cardiovascular;  Laterality: Right;  . REPLACEMENT TOTAL KNEE Left 03/2010   Dr. Rudene Christians  . TOTAL HIP ARTHROPLASTY Left 12/11/2015   Procedure: TOTAL HIP ARTHROPLASTY ANTERIOR APPROACH;  Surgeon: Hessie Knows, MD;  Location: [facility] ORS;  Service: Orthopedics;  Laterality: Left;  . TOTAL KNEE ARTHROPLASTY Right 03/11/2016   Procedure: TOTAL KNEE ARTHROPLASTY;  Surgeon: Hessie Knows, MD;  Location: [facility] ORS;  Service: Orthopedics;  Laterality: Right;    Family History  Problem Relation Age of Onset  . Diabetes Mother   . Hyperlipidemia Mother   . Hypertension Mother   . Diabetes Father   . Hyperlipidemia Father   . Hypertension Father   . Obesity Father   . Hypertension Sister   . Hyperlipidemia Sister   . Hyperlipidemia Sister   . Breast cancer Neg Hx     Social History   Tobacco Use  . Smoking status: Never Smoker  . Smokeless tobacco: Never Used  Substance Use Topics  . Alcohol use: No    Alcohol/week: 0.0 standard drinks     Current Outpatient Medications:  .   ACCU-CHEK GUIDE test strip, CHECK FASTING BLOOD SUGAR ONCE DAILY AND UP TO 4 TIMES DAILY AS DIRECTED, Disp: 100 strip, Rfl: 12 .  acetaminophen (TYLENOL) 650 MG CR tablet, Take 650-1,300 mg by mouth 2 (two) times daily as needed for pain., Disp: , Rfl:  .  aspirin 81 MG EC tablet, TAKE 1 TABLET BY MOUTH EVERY DAY, Disp: 90 tablet, Rfl: 1 .  atorvastatin (LIPITOR) 40 MG tablet, Take 1 tablet (40 mg total) by mouth every evening., Disp: 90 tablet, Rfl: 1 .  benzonatate (TESSALON) 100 MG capsule, Take 1-2 capsules (100-200 mg total) by mouth 2 (two) times daily as needed., Disp: 40 capsule, Rfl: 0 .  blood glucose meter kit and supplies, Dispense based on patient and insurance preference. Use up to four times daily as directed. (FOR ICD-10 E10.9, E11.9)., Disp: 1 each, Rfl: 0 .  Calcium Carbonate-Vitamin D 600-400 MG-UNIT tablet, Take 1 tablet by mouth 2 (two) times daily., Disp: , Rfl:  .  calcium-vitamin D (  OSCAL WITH D) 500-200 MG-UNIT tablet, Take 1 tablet by mouth 2 (two) times daily., Disp: 60 tablet, Rfl: 0 .  fluticasone (FLONASE) 50 MCG/ACT nasal spray, Place 2 sprays into both nostrils daily., Disp: 16 g, Rfl: 2 .  imipramine (TOFRANIL) 25 MG tablet, TAKE 1 TABLET BY MOUTH EVERYDAY AT BEDTIME, Disp: 90 tablet, Rfl: 3 .  ketoconazole (NIZORAL) 2 % cream, Apply 1 application topically 2 (two) times daily as needed (rash). , Disp: , Rfl:  .  latanoprost (XALATAN) 0.005 % ophthalmic solution, SMARTSIG:In Eye(s), Disp: , Rfl:  .  levothyroxine (SYNTHROID) 25 MCG tablet, Take 1 tablet (25 mcg total) by mouth daily at 6 (six) AM., Disp: 30 tablet, Rfl: 0 .  Magnesium Oxide 500 MG CAPS, Take 500 mg by mouth daily. , Disp: , Rfl:  .  metoprolol succinate (TOPROL-XL) 25 MG 24 hr tablet, Take 1 tablet (25 mg total) by mouth daily., Disp: 90 tablet, Rfl: 0 .  midodrine (PROAMATINE) 10 MG tablet, Take 1 tablet (10 mg total) by mouth 3 (three) times daily with meals., Disp: 90 tablet, Rfl: 0 .  mirabegron  ER (MYRBETRIQ) 50 MG TB24 tablet, Take 1 tablet (50 mg total) by mouth daily., Disp: 90 tablet, Rfl: 3 .  polyethylene glycol (MIRALAX / GLYCOLAX) 17 g packet, Take 17 g by mouth daily., Disp: 14 each, Rfl: 0 .  potassium chloride (KLOR-CON) 10 MEQ tablet, Take 10 mEq by mouth daily., Disp: , Rfl:  .  SIMBRINZA 1-0.2 % SUSP, Place 1 drop into both eyes 2 (two) times daily., Disp: , Rfl:  .  torsemide (DEMADEX) 20 MG tablet, Take 1 tablet (20 mg total) by mouth daily., Disp: 30 tablet, Rfl: 0  Allergies  Allergen Reactions  . Lisinopril Swelling    I personally reviewed active problem list, medication list, allergies, family history, social history, health maintenance with the patient/caregiver today.   ROS  Ten systems reviewed and is negative except as mentioned in HPI   Objective  Vitals:   09/28/20 1140  BP: 132/84  Pulse: 69  Resp: 16  Temp: 99 F (37.2 C)  SpO2: 99%  Weight: (!) 318 lb 8 oz (144.5 kg)  Height: 5' 11"  (1.803 m)    Body mass index is 44.42 kg/m.  Physical Exam  Constitutional: Patient appears well-developed and well-nourished. Obese  No distress.  HEENT: head atraumatic, normocephalic, pupils equal and reactive to light,  neck supple Cardiovascular: Normal rate, regular rhythm and normal heart sounds.  No murmur heard.  BLE edema. Wearing a wrapp Pulmonary/Chest: Effort normal , crackles on base right side worse than left. No respiratory distresss Abdominal: Soft.  There is no tenderness. Skin: area of cellulitis on left outer thigh without redness or increase in warmth but has some scarring and induration Muscular skeletal; sitting on wheelchair, left hip internally rotated, decrease sensation on left outer hip/thigh.  Psychiatric: Patient has a normal mood and affect. behavior is normal. Judgment and thought content normal.    PHQ2/9: Depression screen [facility] 2/9 09/28/2020 08/28/2020 07/31/2020 01/27/2020 09/23/2019  Decreased Interest 0 0 0 0 0   Down, Depressed, Hopeless 0 0 0 0 0  PHQ - 2 Score 0 0 0 0 0  Altered sleeping 0 - - 0 0  Tired, decreased energy 0 - - 0 0  Change in appetite 0 - - 0 0  Feeling bad or failure about yourself  0 - - 0 0  Trouble concentrating 0 - - 0 0  Moving slowly or fidgety/restless 0 - - 0 0  Suicidal thoughts 0 - - 0 0  PHQ-9 Score 0 - - 0 0  Difficult doing work/chores Not difficult at all - - - -  Some recent data might be hidden    phq 9 is negative    Fall Risk: Fall Risk  09/28/2020 08/28/2020 07/31/2020 05/29/2020 01/27/2020  Falls in the past year? 1 0 0 0 0  Number falls in past yr: 1 0 0 0 0  Comment - - - - -  Injury with Fall? 0 0 0 0 0  Comment - - - - -  Risk for fall due to : - - Impaired mobility;Impaired balance/gait - -  Follow up - - Falls prevention discussed - -     Functional Status Survey: Is the patient deaf or have difficulty hearing?: No Does the patient have difficulty seeing, even when wearing glasses/contacts?: No Does the patient have difficulty concentrating, remembering, or making decisions?: No Does the patient have difficulty walking or climbing stairs?: Yes Does the patient have difficulty dressing or bathing?: No Does the patient have difficulty doing errands alone such as visiting a doctor's office or shopping?: Yes   Assessment & Plan  1. Hospital discharge follow-up  - BASIC METABOLIC PANEL WITH GFR - CBC with Differential/Platelet - TSH - CK (Creatine Kinase)  2. Cellulitis of left thigh  Resolved  3. Interstitial lung disease ([place])  Still has crackles on both bases but right worse than left side,  No sob, consider evaluation by pulmonologist   4. MRSA bacteremia  Keep follow up with ID   5. Type 2 diabetes mellitus with diabetic neuropathy, without long-term current use of insulin (HCC)  - POCT HgB A1C  Advised to return for DM follow up, A1C is at goal

## 2020-09-29 LAB — CBC WITH DIFFERENTIAL/PLATELET
Absolute Monocytes: 374 cells/uL (ref 200–950)
Basophils Absolute: 21 cells/uL (ref 0–200)
Basophils Relative: 0.4 %
Eosinophils Absolute: 224 cells/uL (ref 15–500)
Eosinophils Relative: 4.3 %
HCT: 36.8 % (ref 35.0–45.0)
Hemoglobin: 12 g/dL (ref 11.7–15.5)
Lymphs Abs: 1066 cells/uL (ref 850–3900)
MCH: 30.5 pg (ref 27.0–33.0)
MCHC: 32.6 g/dL (ref 32.0–36.0)
MCV: 93.6 fL (ref 80.0–100.0)
MPV: 10.9 fL (ref 7.5–12.5)
Monocytes Relative: 7.2 %
Neutro Abs: 3515 cells/uL (ref 1500–7800)
Neutrophils Relative %: 67.6 %
Platelets: 196 10*3/uL (ref 140–400)
RBC: 3.93 10*6/uL (ref 3.80–5.10)
RDW: 15.1 % — ABNORMAL HIGH (ref 11.0–15.0)
Total Lymphocyte: 20.5 %
WBC: 5.2 10*3/uL (ref 3.8–10.8)

## 2020-09-29 LAB — BASIC METABOLIC PANEL WITH GFR
BUN/Creatinine Ratio: 16 (calc) (ref 6–22)
BUN: 20 mg/dL (ref 7–25)
CO2: 25 mmol/L (ref 20–32)
Calcium: 9.5 mg/dL (ref 8.6–10.4)
Chloride: 101 mmol/L (ref 98–110)
Creat: 1.28 mg/dL — ABNORMAL HIGH (ref 0.60–0.93)
GFR, Est African American: 48 mL/min/{1.73_m2} — ABNORMAL LOW (ref >=60)
GFR, Est Non African American: 41 mL/min/{1.73_m2} — ABNORMAL LOW (ref >=60)
Glucose, Bld: 79 mg/dL (ref 65–99)
Potassium: 4.3 mmol/L (ref 3.5–5.3)
Sodium: 136 mmol/L (ref 135–146)

## 2020-09-29 LAB — CK: Total CK: 239 U/L — ABNORMAL HIGH (ref 29–143)

## 2020-10-02 ENCOUNTER — Telehealth: Payer: Self-pay

## 2020-10-02 NOTE — Telephone Encounter (Signed)
Patient called asking if she is to remain on Doxy after her PICC is removed and IV abx are finished? I advised her based on 09/20/20 visit it is noted that she should start the Doxy again.

## 2020-10-05 DIAGNOSIS — L03116 Cellulitis of left lower limb: Secondary | ICD-10-CM | POA: Diagnosis not present

## 2020-10-05 DIAGNOSIS — B957 Other staphylococcus as the cause of diseases classified elsewhere: Secondary | ICD-10-CM | POA: Diagnosis not present

## 2020-10-05 DIAGNOSIS — G4733 Obstructive sleep apnea (adult) (pediatric): Secondary | ICD-10-CM | POA: Diagnosis not present

## 2020-10-05 DIAGNOSIS — E1122 Type 2 diabetes mellitus with diabetic chronic kidney disease: Secondary | ICD-10-CM | POA: Diagnosis not present

## 2020-10-05 DIAGNOSIS — Z792 Long term (current) use of antibiotics: Secondary | ICD-10-CM | POA: Diagnosis not present

## 2020-10-05 DIAGNOSIS — D631 Anemia in chronic kidney disease: Secondary | ICD-10-CM | POA: Diagnosis not present

## 2020-10-05 DIAGNOSIS — E039 Hypothyroidism, unspecified: Secondary | ICD-10-CM | POA: Diagnosis not present

## 2020-10-05 DIAGNOSIS — I959 Hypotension, unspecified: Secondary | ICD-10-CM | POA: Diagnosis not present

## 2020-10-05 DIAGNOSIS — I471 Supraventricular tachycardia: Secondary | ICD-10-CM | POA: Diagnosis not present

## 2020-10-05 DIAGNOSIS — T8453XA Infection and inflammatory reaction due to internal right knee prosthesis, initial encounter: Secondary | ICD-10-CM | POA: Diagnosis not present

## 2020-10-05 DIAGNOSIS — Z452 Encounter for adjustment and management of vascular access device: Secondary | ICD-10-CM | POA: Diagnosis not present

## 2020-10-05 DIAGNOSIS — K5790 Diverticulosis of intestine, part unspecified, without perforation or abscess without bleeding: Secondary | ICD-10-CM | POA: Diagnosis not present

## 2020-10-05 DIAGNOSIS — E1151 Type 2 diabetes mellitus with diabetic peripheral angiopathy without gangrene: Secondary | ICD-10-CM | POA: Diagnosis not present

## 2020-10-05 DIAGNOSIS — A4102 Sepsis due to Methicillin resistant Staphylococcus aureus: Secondary | ICD-10-CM | POA: Diagnosis not present

## 2020-10-05 DIAGNOSIS — A401 Sepsis due to streptococcus, group B: Secondary | ICD-10-CM | POA: Diagnosis not present

## 2020-10-05 DIAGNOSIS — M5030 Other cervical disc degeneration, unspecified cervical region: Secondary | ICD-10-CM | POA: Diagnosis not present

## 2020-10-05 DIAGNOSIS — N1831 Chronic kidney disease, stage 3a: Secondary | ICD-10-CM | POA: Diagnosis not present

## 2020-10-05 DIAGNOSIS — M47812 Spondylosis without myelopathy or radiculopathy, cervical region: Secondary | ICD-10-CM | POA: Diagnosis not present

## 2020-10-05 DIAGNOSIS — I872 Venous insufficiency (chronic) (peripheral): Secondary | ICD-10-CM | POA: Diagnosis not present

## 2020-10-05 DIAGNOSIS — E441 Mild protein-calorie malnutrition: Secondary | ICD-10-CM | POA: Diagnosis not present

## 2020-10-05 DIAGNOSIS — E1142 Type 2 diabetes mellitus with diabetic polyneuropathy: Secondary | ICD-10-CM | POA: Diagnosis not present

## 2020-10-05 DIAGNOSIS — I130 Hypertensive heart and chronic kidney disease with heart failure and stage 1 through stage 4 chronic kidney disease, or unspecified chronic kidney disease: Secondary | ICD-10-CM | POA: Diagnosis not present

## 2020-10-05 DIAGNOSIS — E785 Hyperlipidemia, unspecified: Secondary | ICD-10-CM | POA: Diagnosis not present

## 2020-10-08 DIAGNOSIS — M47812 Spondylosis without myelopathy or radiculopathy, cervical region: Secondary | ICD-10-CM | POA: Diagnosis not present

## 2020-10-08 DIAGNOSIS — D631 Anemia in chronic kidney disease: Secondary | ICD-10-CM | POA: Diagnosis not present

## 2020-10-08 DIAGNOSIS — K5790 Diverticulosis of intestine, part unspecified, without perforation or abscess without bleeding: Secondary | ICD-10-CM | POA: Diagnosis not present

## 2020-10-08 DIAGNOSIS — N1831 Chronic kidney disease, stage 3a: Secondary | ICD-10-CM | POA: Diagnosis not present

## 2020-10-08 DIAGNOSIS — G4733 Obstructive sleep apnea (adult) (pediatric): Secondary | ICD-10-CM | POA: Diagnosis not present

## 2020-10-08 DIAGNOSIS — E1122 Type 2 diabetes mellitus with diabetic chronic kidney disease: Secondary | ICD-10-CM | POA: Diagnosis not present

## 2020-10-08 DIAGNOSIS — L03116 Cellulitis of left lower limb: Secondary | ICD-10-CM | POA: Diagnosis not present

## 2020-10-08 DIAGNOSIS — I471 Supraventricular tachycardia: Secondary | ICD-10-CM | POA: Diagnosis not present

## 2020-10-08 DIAGNOSIS — A4102 Sepsis due to Methicillin resistant Staphylococcus aureus: Secondary | ICD-10-CM | POA: Diagnosis not present

## 2020-10-08 DIAGNOSIS — A401 Sepsis due to streptococcus, group B: Secondary | ICD-10-CM | POA: Diagnosis not present

## 2020-10-08 DIAGNOSIS — T8453XA Infection and inflammatory reaction due to internal right knee prosthesis, initial encounter: Secondary | ICD-10-CM | POA: Diagnosis not present

## 2020-10-08 DIAGNOSIS — I130 Hypertensive heart and chronic kidney disease with heart failure and stage 1 through stage 4 chronic kidney disease, or unspecified chronic kidney disease: Secondary | ICD-10-CM | POA: Diagnosis not present

## 2020-10-08 DIAGNOSIS — I959 Hypotension, unspecified: Secondary | ICD-10-CM | POA: Diagnosis not present

## 2020-10-08 DIAGNOSIS — Z792 Long term (current) use of antibiotics: Secondary | ICD-10-CM | POA: Diagnosis not present

## 2020-10-08 DIAGNOSIS — I872 Venous insufficiency (chronic) (peripheral): Secondary | ICD-10-CM | POA: Diagnosis not present

## 2020-10-08 DIAGNOSIS — E441 Mild protein-calorie malnutrition: Secondary | ICD-10-CM | POA: Diagnosis not present

## 2020-10-08 DIAGNOSIS — E039 Hypothyroidism, unspecified: Secondary | ICD-10-CM | POA: Diagnosis not present

## 2020-10-08 DIAGNOSIS — Z452 Encounter for adjustment and management of vascular access device: Secondary | ICD-10-CM | POA: Diagnosis not present

## 2020-10-08 DIAGNOSIS — B957 Other staphylococcus as the cause of diseases classified elsewhere: Secondary | ICD-10-CM | POA: Diagnosis not present

## 2020-10-08 DIAGNOSIS — E1151 Type 2 diabetes mellitus with diabetic peripheral angiopathy without gangrene: Secondary | ICD-10-CM | POA: Diagnosis not present

## 2020-10-08 DIAGNOSIS — M5030 Other cervical disc degeneration, unspecified cervical region: Secondary | ICD-10-CM | POA: Diagnosis not present

## 2020-10-08 DIAGNOSIS — E785 Hyperlipidemia, unspecified: Secondary | ICD-10-CM | POA: Diagnosis not present

## 2020-10-08 DIAGNOSIS — E1142 Type 2 diabetes mellitus with diabetic polyneuropathy: Secondary | ICD-10-CM | POA: Diagnosis not present

## 2020-10-08 NOTE — Telephone Encounter (Signed)
Pt calling to f/up / please advise

## 2020-10-10 ENCOUNTER — Other Ambulatory Visit: Payer: Self-pay | Admitting: Urology

## 2020-10-10 ENCOUNTER — Other Ambulatory Visit: Payer: Self-pay | Admitting: Family Medicine

## 2020-10-10 DIAGNOSIS — I10 Essential (primary) hypertension: Secondary | ICD-10-CM

## 2020-10-11 DIAGNOSIS — E441 Mild protein-calorie malnutrition: Secondary | ICD-10-CM | POA: Diagnosis not present

## 2020-10-11 DIAGNOSIS — G4733 Obstructive sleep apnea (adult) (pediatric): Secondary | ICD-10-CM | POA: Diagnosis not present

## 2020-10-11 DIAGNOSIS — D631 Anemia in chronic kidney disease: Secondary | ICD-10-CM | POA: Diagnosis not present

## 2020-10-11 DIAGNOSIS — T8453XA Infection and inflammatory reaction due to internal right knee prosthesis, initial encounter: Secondary | ICD-10-CM | POA: Diagnosis not present

## 2020-10-11 DIAGNOSIS — N1831 Chronic kidney disease, stage 3a: Secondary | ICD-10-CM | POA: Diagnosis not present

## 2020-10-11 DIAGNOSIS — Z452 Encounter for adjustment and management of vascular access device: Secondary | ICD-10-CM | POA: Diagnosis not present

## 2020-10-11 DIAGNOSIS — B957 Other staphylococcus as the cause of diseases classified elsewhere: Secondary | ICD-10-CM | POA: Diagnosis not present

## 2020-10-11 DIAGNOSIS — M5030 Other cervical disc degeneration, unspecified cervical region: Secondary | ICD-10-CM | POA: Diagnosis not present

## 2020-10-11 DIAGNOSIS — I130 Hypertensive heart and chronic kidney disease with heart failure and stage 1 through stage 4 chronic kidney disease, or unspecified chronic kidney disease: Secondary | ICD-10-CM | POA: Diagnosis not present

## 2020-10-11 DIAGNOSIS — E1151 Type 2 diabetes mellitus with diabetic peripheral angiopathy without gangrene: Secondary | ICD-10-CM | POA: Diagnosis not present

## 2020-10-11 DIAGNOSIS — L03116 Cellulitis of left lower limb: Secondary | ICD-10-CM | POA: Diagnosis not present

## 2020-10-11 DIAGNOSIS — M47812 Spondylosis without myelopathy or radiculopathy, cervical region: Secondary | ICD-10-CM | POA: Diagnosis not present

## 2020-10-11 DIAGNOSIS — E785 Hyperlipidemia, unspecified: Secondary | ICD-10-CM | POA: Diagnosis not present

## 2020-10-11 DIAGNOSIS — K5790 Diverticulosis of intestine, part unspecified, without perforation or abscess without bleeding: Secondary | ICD-10-CM | POA: Diagnosis not present

## 2020-10-11 DIAGNOSIS — Z792 Long term (current) use of antibiotics: Secondary | ICD-10-CM | POA: Diagnosis not present

## 2020-10-11 DIAGNOSIS — I471 Supraventricular tachycardia: Secondary | ICD-10-CM | POA: Diagnosis not present

## 2020-10-11 DIAGNOSIS — E039 Hypothyroidism, unspecified: Secondary | ICD-10-CM | POA: Diagnosis not present

## 2020-10-11 DIAGNOSIS — E1122 Type 2 diabetes mellitus with diabetic chronic kidney disease: Secondary | ICD-10-CM | POA: Diagnosis not present

## 2020-10-11 DIAGNOSIS — E1142 Type 2 diabetes mellitus with diabetic polyneuropathy: Secondary | ICD-10-CM | POA: Diagnosis not present

## 2020-10-11 DIAGNOSIS — A4102 Sepsis due to Methicillin resistant Staphylococcus aureus: Secondary | ICD-10-CM | POA: Diagnosis not present

## 2020-10-11 DIAGNOSIS — A401 Sepsis due to streptococcus, group B: Secondary | ICD-10-CM | POA: Diagnosis not present

## 2020-10-11 DIAGNOSIS — I872 Venous insufficiency (chronic) (peripheral): Secondary | ICD-10-CM | POA: Diagnosis not present

## 2020-10-11 DIAGNOSIS — I959 Hypotension, unspecified: Secondary | ICD-10-CM | POA: Diagnosis not present

## 2020-10-12 ENCOUNTER — Telehealth: Payer: Self-pay | Admitting: Family Medicine

## 2020-10-12 NOTE — Telephone Encounter (Signed)
Requested medication (s) are due for refill today: Yes  Requested medication (s) are on the active medication list: Yes  Last refill:  09/15/20  Future visit scheduled: Yes  Notes to clinic:  Unable to refill per protocol, last refill by another provider.     Requested Prescriptions  Pending Prescriptions Disp Refills   levothyroxine (SYNTHROID) 25 MCG tablet 30 tablet 0    Sig: Take 1 tablet (25 mcg total) by mouth daily at 6 (six) AM.      Endocrinology:  Hypothyroid Agents Failed - 10/12/2020  4:05 PM      Failed - TSH needs to be rechecked within 3 months after an abnormal result. Refill until TSH is due.      Failed - TSH in normal range and within 360 days    TSH  Date Value Ref Range Status  09/06/2020 9.079 (H) 0.350 - 4.500 uIU/mL Final    Comment:    Performed by a 3rd Generation assay with a functional sensitivity of <=0.01 uIU/mL. Performed at [facility], [address], [place], [place] [address]           Passed - Valid encounter within last 12 months    Recent Outpatient Visits           2 weeks ago Hospital discharge follow-up   [facility] [name], MD   1 month ago Type 2 diabetes mellitus with diabetic neuropathy, without long-term current use of insulin [facility])   [facility] [place], Drue Stager, MD   4 months ago Type 2 diabetes mellitus with diabetic neuropathy, without long-term current use of insulin [facility])   [facility] [place], Drue Stager, MD   8 months ago Type 2 diabetes mellitus with diabetic neuropathy, without long-term current use of insulin [facility])   [facility] [name], MD   1 year ago Chronic infection of right knee [facility])   [facility] [name], MD       Future Appointments             In 1 month Ancil Boozer, Drue Stager, MD [facility], [place]   In 2 months [name], [facility] [place]   In 3 months [name], MD [facility], [place]   In 9 months  [facility], [facility]

## 2020-10-12 NOTE — Telephone Encounter (Signed)
Medication Refill - Medication: Levothyroxine  Has the patient contacted their pharmacy? Yes.   Pt called stating that she has reached out to pharmacy. She states that they have not received a response back. Pt states that she has 4 pills left. Please advise. (Agent: If no, request that the patient contact the pharmacy for the refill.) (Agent: If yes, when and what did the pharmacy advise?)  Preferred Pharmacy (with phone number or street name):  [id number] - [place], [place] - [address]  [address] [place] [address]  Phone: [phone] Fax: [phone]  Hours: Not open 24 hours     Agent: Please be advised that RX refills may take up to 3 business days. We ask that you follow-up with your pharmacy.

## 2020-10-15 ENCOUNTER — Other Ambulatory Visit: Payer: Self-pay

## 2020-10-16 MED ORDER — LEVOTHYROXINE SODIUM 25 MCG PO TABS: 25.0000 ug | ORAL_TABLET | Freq: Every day | ORAL | 0 refills | Status: DC

## 2020-10-17 NOTE — Telephone Encounter (Signed)
Pt states she cannot get here this week or next week. 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 Pt will get here when she can.

## 2020-10-22 DIAGNOSIS — L97511 Non-pressure chronic ulcer of other part of right foot limited to breakdown of skin: Secondary | ICD-10-CM | POA: Diagnosis not present

## 2020-10-22 DIAGNOSIS — I480 Paroxysmal atrial fibrillation: Secondary | ICD-10-CM | POA: Diagnosis not present

## 2020-10-22 DIAGNOSIS — N183 Chronic kidney disease, stage 3 unspecified: Secondary | ICD-10-CM | POA: Diagnosis not present

## 2020-10-22 DIAGNOSIS — I5032 Chronic diastolic (congestive) heart failure: Secondary | ICD-10-CM | POA: Diagnosis not present

## 2020-10-22 DIAGNOSIS — R600 Localized edema: Secondary | ICD-10-CM | POA: Diagnosis not present

## 2020-10-22 DIAGNOSIS — I10 Essential (primary) hypertension: Secondary | ICD-10-CM | POA: Diagnosis not present

## 2020-10-22 DIAGNOSIS — G4730 Sleep apnea, unspecified: Secondary | ICD-10-CM | POA: Diagnosis not present

## 2020-10-22 DIAGNOSIS — I2510 Atherosclerotic heart disease of native coronary artery without angina pectoris: Secondary | ICD-10-CM | POA: Diagnosis not present

## 2020-10-22 DIAGNOSIS — R0602 Shortness of breath: Secondary | ICD-10-CM | POA: Diagnosis not present

## 2020-10-22 DIAGNOSIS — R55 Syncope and collapse: Secondary | ICD-10-CM | POA: Diagnosis not present

## 2020-10-22 DIAGNOSIS — E089 Diabetes mellitus due to underlying condition without complications: Secondary | ICD-10-CM | POA: Diagnosis not present

## 2020-10-22 DIAGNOSIS — L03115 Cellulitis of right lower limb: Secondary | ICD-10-CM | POA: Diagnosis not present

## 2020-10-22 DIAGNOSIS — E7849 Other hyperlipidemia: Secondary | ICD-10-CM | POA: Diagnosis not present

## 2020-10-23 DIAGNOSIS — G5723 Lesion of femoral nerve, bilateral lower limbs: Secondary | ICD-10-CM | POA: Diagnosis not present

## 2020-10-26 DIAGNOSIS — N1831 Chronic kidney disease, stage 3a: Secondary | ICD-10-CM | POA: Diagnosis not present

## 2020-10-26 DIAGNOSIS — E441 Mild protein-calorie malnutrition: Secondary | ICD-10-CM | POA: Diagnosis not present

## 2020-10-26 DIAGNOSIS — A401 Sepsis due to streptococcus, group B: Secondary | ICD-10-CM | POA: Diagnosis not present

## 2020-10-26 DIAGNOSIS — T8453XA Infection and inflammatory reaction due to internal right knee prosthesis, initial encounter: Secondary | ICD-10-CM | POA: Diagnosis not present

## 2020-10-26 DIAGNOSIS — E785 Hyperlipidemia, unspecified: Secondary | ICD-10-CM | POA: Diagnosis not present

## 2020-10-26 DIAGNOSIS — D631 Anemia in chronic kidney disease: Secondary | ICD-10-CM | POA: Diagnosis not present

## 2020-10-26 DIAGNOSIS — E1151 Type 2 diabetes mellitus with diabetic peripheral angiopathy without gangrene: Secondary | ICD-10-CM | POA: Diagnosis not present

## 2020-10-26 DIAGNOSIS — M47812 Spondylosis without myelopathy or radiculopathy, cervical region: Secondary | ICD-10-CM | POA: Diagnosis not present

## 2020-10-26 DIAGNOSIS — K5790 Diverticulosis of intestine, part unspecified, without perforation or abscess without bleeding: Secondary | ICD-10-CM | POA: Diagnosis not present

## 2020-10-26 DIAGNOSIS — L03116 Cellulitis of left lower limb: Secondary | ICD-10-CM | POA: Diagnosis not present

## 2020-10-26 DIAGNOSIS — A4102 Sepsis due to Methicillin resistant Staphylococcus aureus: Secondary | ICD-10-CM | POA: Diagnosis not present

## 2020-10-26 DIAGNOSIS — E1142 Type 2 diabetes mellitus with diabetic polyneuropathy: Secondary | ICD-10-CM | POA: Diagnosis not present

## 2020-10-26 DIAGNOSIS — I130 Hypertensive heart and chronic kidney disease with heart failure and stage 1 through stage 4 chronic kidney disease, or unspecified chronic kidney disease: Secondary | ICD-10-CM | POA: Diagnosis not present

## 2020-10-26 DIAGNOSIS — I872 Venous insufficiency (chronic) (peripheral): Secondary | ICD-10-CM | POA: Diagnosis not present

## 2020-10-26 DIAGNOSIS — B957 Other staphylococcus as the cause of diseases classified elsewhere: Secondary | ICD-10-CM | POA: Diagnosis not present

## 2020-10-26 DIAGNOSIS — M5030 Other cervical disc degeneration, unspecified cervical region: Secondary | ICD-10-CM | POA: Diagnosis not present

## 2020-10-26 DIAGNOSIS — G4733 Obstructive sleep apnea (adult) (pediatric): Secondary | ICD-10-CM | POA: Diagnosis not present

## 2020-10-26 DIAGNOSIS — Z452 Encounter for adjustment and management of vascular access device: Secondary | ICD-10-CM | POA: Diagnosis not present

## 2020-10-26 DIAGNOSIS — E039 Hypothyroidism, unspecified: Secondary | ICD-10-CM | POA: Diagnosis not present

## 2020-10-26 DIAGNOSIS — E1122 Type 2 diabetes mellitus with diabetic chronic kidney disease: Secondary | ICD-10-CM | POA: Diagnosis not present

## 2020-10-26 DIAGNOSIS — I959 Hypotension, unspecified: Secondary | ICD-10-CM | POA: Diagnosis not present

## 2020-10-26 DIAGNOSIS — Z792 Long term (current) use of antibiotics: Secondary | ICD-10-CM | POA: Diagnosis not present

## 2020-10-26 DIAGNOSIS — I471 Supraventricular tachycardia: Secondary | ICD-10-CM | POA: Diagnosis not present

## 2020-10-27 DIAGNOSIS — E1140 Type 2 diabetes mellitus with diabetic neuropathy, unspecified: Secondary | ICD-10-CM | POA: Diagnosis not present

## 2020-10-27 DIAGNOSIS — L03115 Cellulitis of right lower limb: Secondary | ICD-10-CM | POA: Diagnosis not present

## 2020-10-27 DIAGNOSIS — R2681 Unsteadiness on feet: Secondary | ICD-10-CM | POA: Diagnosis not present

## 2020-10-29 DIAGNOSIS — M5030 Other cervical disc degeneration, unspecified cervical region: Secondary | ICD-10-CM | POA: Diagnosis not present

## 2020-10-29 DIAGNOSIS — I471 Supraventricular tachycardia: Secondary | ICD-10-CM | POA: Diagnosis not present

## 2020-10-29 DIAGNOSIS — E1122 Type 2 diabetes mellitus with diabetic chronic kidney disease: Secondary | ICD-10-CM | POA: Diagnosis not present

## 2020-10-29 DIAGNOSIS — L03116 Cellulitis of left lower limb: Secondary | ICD-10-CM | POA: Diagnosis not present

## 2020-10-29 DIAGNOSIS — D631 Anemia in chronic kidney disease: Secondary | ICD-10-CM | POA: Diagnosis not present

## 2020-10-29 DIAGNOSIS — M47812 Spondylosis without myelopathy or radiculopathy, cervical region: Secondary | ICD-10-CM | POA: Diagnosis not present

## 2020-10-29 DIAGNOSIS — L03115 Cellulitis of right lower limb: Secondary | ICD-10-CM | POA: Diagnosis not present

## 2020-10-29 DIAGNOSIS — B957 Other staphylococcus as the cause of diseases classified elsewhere: Secondary | ICD-10-CM | POA: Diagnosis not present

## 2020-10-29 DIAGNOSIS — E785 Hyperlipidemia, unspecified: Secondary | ICD-10-CM | POA: Diagnosis not present

## 2020-10-29 DIAGNOSIS — I130 Hypertensive heart and chronic kidney disease with heart failure and stage 1 through stage 4 chronic kidney disease, or unspecified chronic kidney disease: Secondary | ICD-10-CM | POA: Diagnosis not present

## 2020-10-29 DIAGNOSIS — G4733 Obstructive sleep apnea (adult) (pediatric): Secondary | ICD-10-CM | POA: Diagnosis not present

## 2020-10-29 DIAGNOSIS — I872 Venous insufficiency (chronic) (peripheral): Secondary | ICD-10-CM | POA: Diagnosis not present

## 2020-10-29 DIAGNOSIS — A401 Sepsis due to streptococcus, group B: Secondary | ICD-10-CM | POA: Diagnosis not present

## 2020-10-29 DIAGNOSIS — Z792 Long term (current) use of antibiotics: Secondary | ICD-10-CM | POA: Diagnosis not present

## 2020-10-29 DIAGNOSIS — E1142 Type 2 diabetes mellitus with diabetic polyneuropathy: Secondary | ICD-10-CM | POA: Diagnosis not present

## 2020-10-29 DIAGNOSIS — E1151 Type 2 diabetes mellitus with diabetic peripheral angiopathy without gangrene: Secondary | ICD-10-CM | POA: Diagnosis not present

## 2020-10-29 DIAGNOSIS — E039 Hypothyroidism, unspecified: Secondary | ICD-10-CM | POA: Diagnosis not present

## 2020-10-29 DIAGNOSIS — Z452 Encounter for adjustment and management of vascular access device: Secondary | ICD-10-CM | POA: Diagnosis not present

## 2020-10-29 DIAGNOSIS — N1831 Chronic kidney disease, stage 3a: Secondary | ICD-10-CM | POA: Diagnosis not present

## 2020-10-29 DIAGNOSIS — I959 Hypotension, unspecified: Secondary | ICD-10-CM | POA: Diagnosis not present

## 2020-10-29 DIAGNOSIS — T8453XA Infection and inflammatory reaction due to internal right knee prosthesis, initial encounter: Secondary | ICD-10-CM | POA: Diagnosis not present

## 2020-10-29 DIAGNOSIS — A4102 Sepsis due to Methicillin resistant Staphylococcus aureus: Secondary | ICD-10-CM | POA: Diagnosis not present

## 2020-10-29 DIAGNOSIS — E441 Mild protein-calorie malnutrition: Secondary | ICD-10-CM | POA: Diagnosis not present

## 2020-10-29 DIAGNOSIS — K5790 Diverticulosis of intestine, part unspecified, without perforation or abscess without bleeding: Secondary | ICD-10-CM | POA: Diagnosis not present

## 2020-10-29 NOTE — Telephone Encounter (Signed)
Is it possible to refax to [facility]? Pt states that she has not heard anything from them

## 2020-11-08 DIAGNOSIS — B957 Other staphylococcus as the cause of diseases classified elsewhere: Secondary | ICD-10-CM | POA: Diagnosis not present

## 2020-11-08 DIAGNOSIS — D631 Anemia in chronic kidney disease: Secondary | ICD-10-CM | POA: Diagnosis not present

## 2020-11-08 DIAGNOSIS — I130 Hypertensive heart and chronic kidney disease with heart failure and stage 1 through stage 4 chronic kidney disease, or unspecified chronic kidney disease: Secondary | ICD-10-CM | POA: Diagnosis not present

## 2020-11-08 DIAGNOSIS — E039 Hypothyroidism, unspecified: Secondary | ICD-10-CM | POA: Diagnosis not present

## 2020-11-08 DIAGNOSIS — T8453XA Infection and inflammatory reaction due to internal right knee prosthesis, initial encounter: Secondary | ICD-10-CM | POA: Diagnosis not present

## 2020-11-08 DIAGNOSIS — E1122 Type 2 diabetes mellitus with diabetic chronic kidney disease: Secondary | ICD-10-CM | POA: Diagnosis not present

## 2020-11-08 DIAGNOSIS — I471 Supraventricular tachycardia: Secondary | ICD-10-CM | POA: Diagnosis not present

## 2020-11-08 DIAGNOSIS — E785 Hyperlipidemia, unspecified: Secondary | ICD-10-CM | POA: Diagnosis not present

## 2020-11-08 DIAGNOSIS — E1142 Type 2 diabetes mellitus with diabetic polyneuropathy: Secondary | ICD-10-CM | POA: Diagnosis not present

## 2020-11-08 DIAGNOSIS — A4102 Sepsis due to Methicillin resistant Staphylococcus aureus: Secondary | ICD-10-CM | POA: Diagnosis not present

## 2020-11-08 DIAGNOSIS — Z452 Encounter for adjustment and management of vascular access device: Secondary | ICD-10-CM | POA: Diagnosis not present

## 2020-11-08 DIAGNOSIS — I959 Hypotension, unspecified: Secondary | ICD-10-CM | POA: Diagnosis not present

## 2020-11-08 DIAGNOSIS — L03116 Cellulitis of left lower limb: Secondary | ICD-10-CM | POA: Diagnosis not present

## 2020-11-08 DIAGNOSIS — M47812 Spondylosis without myelopathy or radiculopathy, cervical region: Secondary | ICD-10-CM | POA: Diagnosis not present

## 2020-11-08 DIAGNOSIS — E441 Mild protein-calorie malnutrition: Secondary | ICD-10-CM | POA: Diagnosis not present

## 2020-11-08 DIAGNOSIS — Z792 Long term (current) use of antibiotics: Secondary | ICD-10-CM | POA: Diagnosis not present

## 2020-11-08 DIAGNOSIS — N1831 Chronic kidney disease, stage 3a: Secondary | ICD-10-CM | POA: Diagnosis not present

## 2020-11-08 DIAGNOSIS — G4733 Obstructive sleep apnea (adult) (pediatric): Secondary | ICD-10-CM | POA: Diagnosis not present

## 2020-11-08 DIAGNOSIS — E1151 Type 2 diabetes mellitus with diabetic peripheral angiopathy without gangrene: Secondary | ICD-10-CM | POA: Diagnosis not present

## 2020-11-08 DIAGNOSIS — A401 Sepsis due to streptococcus, group B: Secondary | ICD-10-CM | POA: Diagnosis not present

## 2020-11-08 DIAGNOSIS — K5790 Diverticulosis of intestine, part unspecified, without perforation or abscess without bleeding: Secondary | ICD-10-CM | POA: Diagnosis not present

## 2020-11-08 DIAGNOSIS — M5030 Other cervical disc degeneration, unspecified cervical region: Secondary | ICD-10-CM | POA: Diagnosis not present

## 2020-11-08 DIAGNOSIS — I872 Venous insufficiency (chronic) (peripheral): Secondary | ICD-10-CM | POA: Diagnosis not present

## 2020-11-12 ENCOUNTER — Other Ambulatory Visit: Payer: Self-pay | Admitting: Family Medicine

## 2020-11-12 DIAGNOSIS — I10 Essential (primary) hypertension: Secondary | ICD-10-CM

## 2020-11-13 NOTE — Progress Notes (Signed)
Name: Becky Trevino   MRN: [id number]    DOB: December 01, 1945   Date:11/14/2020       Progress Note  Subjective  Chief Complaint  Follow Up  HPI  DMII with renal manifestation andobesity:she denies polyuria, polyphagia or polydipsia. A1C done 09/2020 was 6.1 % last urine micro was normal, we will recheck next visit  She is eating sugar free jello or fruit for sweets. She has CKI stage III   Obesity Morbid:she has been unable to be active, she is wheelchair dependent since Fall 2021, still doing her PT exercises. Aslo avoiding high calorie intake   Hypotension:bp has dropped and she is on Midodrine . She is down to 50 mg of Metoprolol but still taking Midrodrine, advised to go down to half dose of Metoprolol and try to wean off midodrine.   Chronic DVT  right popliteal vein: diagnosed 10/27/2019, she was on Xarelto and at one time Eliquis  but stopped secondary to iliopsoas hematoma, she still has some swelling of right thigh, she has an IV filter. She has been wearing compression stocking hoses and pump and seems to help with symptoms a little. She does not have pain from the DVT but has pain on right hip due to OA - pain only present when she is standing or walking   Hyperlipidemia: taking Atorvastatin daily and denies side effects. No chest pain , no muscles pain.Recheck labs next vsiit   Insomnia/OSA: Shehas been using a hospital bed and unable to wear CPAP latelyLast Echo done by Dr. Clayborn Bigness showed mild pulmonary hypertension. Discussed importance of trying to wear CPAP   OA: multi joints, sees Ortho, Dr. Rudene Christians . She had revision of right knee replacement March 2019 because of infection. Still under the care of [facility] also seeing Dr. Ola Spurr - ID. She is still on daily antibiotics and has follow up coming up with Dr.Fitzgerald   Left femoral nerve palsy: seen by Dr. Rudene Christians, Dr. Manuella Ghazi, now seeing neurosurgeon at [facility] , Dr. Tamala Julian. She is able to walk short distances but s  wearing brace because left knee is unstable due to femoral nerve palsy. She still  needs assistance bathing, cannot drive, cannot transfer alone . She was diagnosed with a hematoma of left iliopsoas during hospital stay 10/2019 , but she had repeat CT pelvis 01/09/2020 that showed resolution of hematoma. She is now off Eliquis and on aspirin only due to history of hematoma Dr. Tamala Julian reviewed her case and advised against doing the surgery to decompress the nerve    Patient Active Problem List   Diagnosis Date Noted  . Anasarca   . Weakness   . Hypothyroidism   . SVT (supraventricular tachycardia) ([place])   . Sepsis due to Streptococcus agalactiae ([place])   . Acute kidney injury superimposed on CKD ([place])   . Hypotension   . Class 2 obesity without serious comorbidity with body mass index (BMI) of 39.0 to 39.9 in adult   . Sepsis ([place]) 09/03/2020  . Sepsis due to undetermined organism ([place]) 09/02/2020  . Thrombocytopenia ([place])   . Mild protein-calorie malnutrition ([place])   . Hyperbilirubinemia   . Injury of left femoral nerve 08/28/2020  . History of DVT (deep vein thrombosis) 06/25/2020  . Lymphedema 06/25/2020  . History of pelvic hematoma 02/10/2020  . Foot ulcer, right, limited to breakdown of skin ([place]) 12/02/2019  . MRSA bacteremia 10/30/2019  . Chronic venous hypertension due to deep vein thrombosis (DVT) 10/30/2019  . Hematoma   .  Normocytic anemia 08/07/2019  . Chronic infection of right knee ([place]) 11/25/2018  . Infection and inflammatory reaction due to internal joint prosthesis, subsequent encounter 01/25/2018  . Infection of total right knee replacement ([place]) 11/19/2017  . Type 2 diabetes mellitus with hyperlipidemia ([place]) 11/06/2016  . Primary osteoarthritis of knee 03/11/2016  . Primary osteoarthritis of left hip 12/11/2015  . Benign essential HTN 04/21/2015  . Edema leg 04/21/2015  . Chronic kidney disease (CKD), stage III (moderate) ([place]) 04/21/2015  . Chronic venous  insufficiency 04/21/2015  . DDD (degenerative disc disease), cervical 04/21/2015  . Diabetes mellitus with renal manifestation ([place]) 04/21/2015  . Hyperlipidemia 04/21/2015  . Bladder cystocele 04/21/2015  . Urinary incontinence 04/21/2015  . Leg varices 04/21/2015  . Insomnia 04/21/2015  . Eczema intertrigo 04/21/2015  . Obesity, Class III, BMI 40-49.9 (morbid obesity) ([place]) 04/21/2015  . OSA (obstructive sleep apnea) 04/21/2015  . Menopausal and perimenopausal disorder 04/21/2015  . Supraventricular premature beats 04/21/2015  . Osteoarthritis of multiple joints 04/21/2015  . H/O Spinal surgery 11/14/2013  . Detrusor muscle hypertonia 11/07/2013    Past Surgical History:  Procedure Laterality Date  . ABDOMINAL HYSTERECTOMY  1993   Total  . BUNIONECTOMY Bilateral 1993  . I & D KNEE WITH POLY EXCHANGE Right 11/19/2017   Procedure: RIGHT KNEE POLY EXCHANGE WITH IRRIGATION AND DEBRIDEMENT;  Surgeon: Hessie Knows, MD;  Location: [facility] ORS;  Service: Orthopedics;  Laterality: Right;  . JOINT REPLACEMENT     bilateral knee  . LAMINECTOMY  11/14/2013    Cervical Fusion , [facility], Dr. Delilah Shan  . LASER ABLATION Bilateral 07/29/2012   Dr. Lucky Cowboy  . LOWER EXTREMITY ANGIOGRAPHY Right 11/02/2019   Procedure: Lower Extremity Angiography;  Surgeon: Katha Cabal, MD;  Location: [facility];  Service: Cardiovascular;  Laterality: Right;  . ORIF ANKLE FRACTURE Right 09/02/2019   Procedure: OPEN REDUCTION INTERNAL FIXATION (ORIF) ANKLE FRACTURE BIMALLEOLAR;  Surgeon: [name], DPM;  Location: [facility];  Service: Podiatry;  Laterality: Right;  . PERIPHERAL VASCULAR THROMBECTOMY Right 03/02/2020   Procedure: PERIPHERAL VASCULAR THROMBECTOMY;  Surgeon: Algernon Huxley, MD;  Location: [facility];  Service: Cardiovascular;  Laterality: Right;  . REPLACEMENT TOTAL KNEE Left 03/2010   Dr. Rudene Christians  . TOTAL HIP ARTHROPLASTY Left 12/11/2015   Procedure: TOTAL HIP ARTHROPLASTY ANTERIOR APPROACH;   Surgeon: Hessie Knows, MD;  Location: [facility];  Service: Orthopedics;  Laterality: Left;  . TOTAL KNEE ARTHROPLASTY Right 03/11/2016   Procedure: TOTAL KNEE ARTHROPLASTY;  Surgeon: Hessie Knows, MD;  Location: [facility];  Service: Orthopedics;  Laterality: Right;    Family History  Problem Relation Age of Onset  . Diabetes Mother   . Hyperlipidemia Mother   . Hypertension Mother   . Diabetes Father   . Hyperlipidemia Father   . Hypertension Father   . Obesity Father   . Hypertension Sister   . Hyperlipidemia Sister   . Hyperlipidemia Sister   . Breast cancer Neg Hx     Social History   Tobacco Use  . Smoking status: Never Smoker  . Smokeless tobacco: Never Used  Substance Use Topics  . Alcohol use: No    Alcohol/week: 0.0 standard drinks     Current Outpatient Medications:  .  ACCU-CHEK GUIDE test strip, CHECK FASTING BLOOD SUGAR ONCE DAILY AND UP TO 4 TIMES DAILY AS DIRECTED, Disp: 100 strip, Rfl: 12 .  acetaminophen (TYLENOL) 650 MG CR tablet, Take 650-1,300 mg by mouth 2 (two) times daily  as needed for pain., Disp: , Rfl:  .  aspirin 81 MG EC tablet, TAKE 1 TABLET BY MOUTH EVERY DAY, Disp: 90 tablet, Rfl: 1 .  atorvastatin (LIPITOR) 40 MG tablet, Take 1 tablet (40 mg total) by mouth every evening., Disp: 90 tablet, Rfl: 1 .  blood glucose meter kit and supplies, Dispense based on patient and insurance preference. Use up to four times daily as directed. (FOR ICD-10 E10.9, E11.9)., Disp: 1 each, Rfl: 0 .  doxycycline (VIBRA-TABS) 100 MG tablet, Take 1 tablet by mouth in the morning and at bedtime., Disp: , Rfl:  .  fluticasone (FLONASE) 50 MCG/ACT nasal spray, Place 2 sprays into both nostrils daily., Disp: 16 g, Rfl: 2 .  imipramine (TOFRANIL) 25 MG tablet, TAKE 1 TABLET BY MOUTH EVERYDAY AT BEDTIME, Disp: 90 tablet, Rfl: 3 .  ketoconazole (NIZORAL) 2 % cream, Apply 1 application topically 2 (two) times daily as needed (rash). , Disp: , Rfl:  .  latanoprost (XALATAN) 0.005 %  ophthalmic solution, SMARTSIG:In Eye(s), Disp: , Rfl:  .  levothyroxine (SYNTHROID) 25 MCG tablet, Take 1 tablet (25 mcg total) by mouth daily at 6 (six) AM., Disp: 30 tablet, Rfl: 0 .  Magnesium Oxide 500 MG CAPS, Take 500 mg by mouth daily. , Disp: , Rfl:  .  metoprolol succinate (TOPROL-XL) 25 MG 24 hr tablet, TAKE 1 TABLET (25 MG TOTAL) BY MOUTH DAILY., Disp: 90 tablet, Rfl: 0 .  mirabegron ER (MYRBETRIQ) 50 MG TB24 tablet, Take 1 tablet (50 mg total) by mouth daily., Disp: 90 tablet, Rfl: 3 .  polyethylene glycol (MIRALAX / GLYCOLAX) 17 g packet, Take 17 g by mouth daily., Disp: 14 each, Rfl: 0 .  potassium chloride (KLOR-CON) 10 MEQ tablet, Take 10 mEq by mouth daily., Disp: , Rfl:  .  SIMBRINZA 1-0.2 % SUSP, Place 1 drop into both eyes 2 (two) times daily., Disp: , Rfl:  .  torsemide (DEMADEX) 20 MG tablet, Take 1 tablet (20 mg total) by mouth daily., Disp: 30 tablet, Rfl: 0  Allergies  Allergen Reactions  . Lisinopril Swelling    I personally reviewed active problem list, medication list, allergies, family history, social history, health maintenance with the patient/caregiver today.   ROS  Constitutional: Negative for fever or weight change.  Respiratory: Negative for cough and shortness of breath.   Cardiovascular: Negative for chest pain or palpitations.  Gastrointestinal: Negative for abdominal pain, no bowel changes.  Musculoskeletal:Postiive for gait problem and  joint swelling.  Skin: Negative for rash.  Neurological: Negative for dizziness or headache.  No other specific complaints in a complete review of systems (except as listed in HPI above).   Objective  Vitals:   11/14/20 1118  BP: 130/86  Pulse: 87  Resp: 16  Temp: (!) 97.5 F (36.4 C)  TempSrc: Oral  SpO2: 99%  Weight: (!) 319 lb (144.7 kg)  Height: 5' 11"  (1.803 m)    Body mass index is 44.49 kg/m.  Physical Exam  Constitutional: Patient appears well-developed and well-nourished. Obese  No  distress.  HEENT: head atraumatic, normocephalic, pupils equal and reactive to light,  neck supple Cardiovascular: Normal rate, regular rhythm and normal heart sounds.  No murmur heard. No BLE edema. Pulmonary/Chest: Effort normal and breath sounds normal. No respiratory distress. Abdominal: Soft.  There is no tenderness. Muscular skeletal: sitting on wheelchair knees towards right side, left hip forward, large area of swelling on left outer hip that is chronic  Psychiatric: Patient has  a normal mood and affect. behavior is normal. Judgment and thought content normal.  Recent Results (from the past 2160 hour(s))  Basic metabolic panel     Status: Abnormal   Collection Time: 09/01/20  3:03 PM  Result Value Ref Range   Sodium 139 135 - 145 mmol/L   Potassium 3.9 3.5 - 5.1 mmol/L   Chloride 102 98 - 111 mmol/L   CO2 26 22 - 32 mmol/L   Glucose, Bld 123 (H) 70 - 99 mg/dL    Comment: Glucose reference range applies only to samples taken after fasting for at least 8 hours.   BUN 17 8 - 23 mg/dL   Creatinine, Ser 1.25 (H) 0.44 - 1.00 mg/dL   Calcium 9.4 8.9 - 10.3 mg/dL   GFR, Estimated 45 (L) >60 mL/min    Comment: (NOTE) Calculated using the CKD-EPI Creatinine Equation (2021)    Anion gap 11 5 - 15    Comment: Performed at [facility], [address] [place], [place] [address]  CBC     Status: Abnormal   Collection Time: 09/01/20  3:03 PM  Result Value Ref Range   WBC 11.9 (H) 4.0 - 10.5 K/uL   RBC 4.29 3.87 - 5.11 MIL/uL   Hemoglobin 13.3 12.0 - 15.0 g/dL   HCT 41.4 36.0 - 46.0 %   MCV 96.5 80.0 - 100.0 fL   MCH 31.0 26.0 - 34.0 pg   MCHC 32.1 30.0 - 36.0 g/dL   RDW 16.9 (H) 11.5 - 15.5 %   Platelets 176 150 - 400 K/uL   nRBC 0.0 0.0 - 0.2 %    Comment: Performed at [facility], [address] [place], [place] [address]  Urinalysis, Complete w Microscopic     Status: Abnormal   Collection Time: 09/01/20  9:36 PM  Result Value Ref Range   Color, Urine  YELLOW (A) YELLOW   APPearance CLEAR (A) CLEAR   Specific Gravity, Urine 1.015 1.005 - 1.030   pH 6.0 5.0 - 8.0   Glucose, UA NEGATIVE NEGATIVE mg/dL   Hgb urine dipstick LARGE (A) NEGATIVE   Bilirubin Urine NEGATIVE NEGATIVE   Ketones, ur NEGATIVE NEGATIVE mg/dL   Protein, ur NEGATIVE NEGATIVE mg/dL   Nitrite NEGATIVE NEGATIVE   Leukocytes,Ua NEGATIVE NEGATIVE   RBC / HPF 21-50 0 - 5 RBC/hpf   WBC, UA 0-5 0 - 5 WBC/hpf   Bacteria, UA NONE SEEN NONE SEEN   Squamous Epithelial / LPF 0-5 0 - 5    Comment: Performed at [facility], [address] [place], [place] [address]  Lactic acid, plasma     Status: Abnormal   Collection Time: 09/02/20  3:42 PM  Result Value Ref Range   Lactic Acid, Venous 2.9 (HH) 0.5 - 1.9 mmol/L    Comment: CRITICAL RESULT CALLED TO, READ BACK BY AND VERIFIED WITH ANGELA ROBBINS ON 09/02/20 AT 1617 BY JAG Performed at [facility], [address] [place], [place] [address]   Comprehensive metabolic panel     Status: Abnormal   Collection Time: 09/02/20  3:42 PM  Result Value Ref Range   Sodium 136 135 - 145 mmol/L   Potassium 3.9 3.5 - 5.1 mmol/L   Chloride 102 98 - 111 mmol/L   CO2 22 22 - 32 mmol/L   Glucose, Bld 101 (H) 70 - 99 mg/dL    Comment: Glucose reference range applies only to samples taken after fasting for at least 8 hours.  BUN 20 8 - 23 mg/dL   Creatinine, Ser 1.52 (H) 0.44 - 1.00 mg/dL   Calcium 8.5 (L) 8.9 - 10.3 mg/dL   Total Protein 7.6 6.5 - 8.1 g/dL   Albumin 3.3 (L) 3.5 - 5.0 g/dL   AST 35 15 - 41 U/L   ALT 18 0 - 44 U/L   Alkaline Phosphatase 82 38 - 126 U/L   Total Bilirubin 1.3 (H) 0.3 - 1.2 mg/dL   GFR, Estimated 36 (L) >60 mL/min    Comment: (NOTE) Calculated using the CKD-EPI Creatinine Equation (2021)    Anion gap 12 5 - 15    Comment: Performed at [facility], [address] [place], [place] [address]  CBC with Differential     Status: Abnormal   Collection Time: 09/02/20  3:42 PM   Result Value Ref Range   WBC 10.4 4.0 - 10.5 K/uL   RBC 3.52 (L) 3.87 - 5.11 MIL/uL   Hemoglobin 11.0 (L) 12.0 - 15.0 g/dL   HCT 33.9 (L) 36.0 - 46.0 %   MCV 96.3 80.0 - 100.0 fL   MCH 31.3 26.0 - 34.0 pg   MCHC 32.4 30.0 - 36.0 g/dL   RDW 17.2 (H) 11.5 - 15.5 %   Platelets 123 (L) 150 - 400 K/uL    Comment: Immature Platelet Fraction may be clinically indicated, consider ordering this additional test [id number]    nRBC 0.0 0.0 - 0.2 %   Neutrophils Relative % 93 %   Neutro Abs 9.6 (H) 1.7 - 7.7 K/uL   Lymphocytes Relative 4 %   Lymphs Abs 0.4 (L) 0.7 - 4.0 K/uL   Monocytes Relative 3 %   Monocytes Absolute 0.3 0.1 - 1.0 K/uL   Eosinophils Relative 0 %   Eosinophils Absolute 0.0 0.0 - 0.5 K/uL   Basophils Relative 0 %   Basophils Absolute 0.0 0.0 - 0.1 K/uL   WBC Morphology MILD LEFT SHIFT (1-5% METAS, OCC MYELO, OCC BANDS)     Comment: VACUOLATED NEUTROPHILS   RBC Morphology MORPHOLOGY UNREMARKABLE    Smear Review Normal platelet morphology    Immature Granulocytes 0 %   Abs Immature Granulocytes 0.04 0.00 - 0.07 K/uL    Comment: Performed at [facility], [address] [place], [place] [address]  Brain natriuretic peptide     Status: Abnormal   Collection Time: 09/02/20  3:42 PM  Result Value Ref Range   B Natriuretic Peptide 224.4 (H) 0.0 - 100.0 pg/mL    Comment: Performed at [facility], [address] [place], [place] [address]  Resp Panel by RT-PCR (Flu A&B, Covid) Nasopharyngeal Swab     Status: None   Collection Time: 09/02/20  4:44 PM   Specimen: Nasopharyngeal Swab; Nasopharyngeal(NP) swabs in vial transport medium  Result Value Ref Range   SARS Coronavirus 2 by RT PCR NEGATIVE NEGATIVE    Comment: (NOTE) SARS-CoV-2 target nucleic acids are NOT DETECTED.  The SARS-CoV-2 RNA is generally detectable in upper respiratory specimens during the acute phase of infection. The lowest concentration of SARS-CoV-2 viral copies this assay can  detect is 138 copies/mL. A negative result does not preclude SARS-Cov-2 infection and should not be used as the sole basis for treatment or other patient management decisions. A negative result may occur with  improper specimen collection/handling, submission of specimen other than nasopharyngeal swab, presence of viral mutation(s) within the areas targeted by this assay, and inadequate number of viral copies(<138 copies/mL). A negative  result must be combined with clinical observations, patient history, and epidemiological information. The expected result is Negative.  Fact Sheet for Patients:  EntrepreneurPulse.com.au  Fact Sheet for Healthcare Providers:  IncredibleEmployment.be  This test is no t yet approved or cleared by the [place] FDA and  has been authorized for detection and/or diagnosis of SARS-CoV-2 by FDA under an Emergency Use Authorization (EUA). This EUA will remain  in effect (meaning this test can be used) for the duration of the COVID-19 declaration under Section 564(b)(1) of the Act, 21 U.S.C.section 360bbb-3(b)(1), unless the authorization is terminated  or revoked sooner.       Influenza A by PCR NEGATIVE NEGATIVE   Influenza B by PCR NEGATIVE NEGATIVE    Comment: (NOTE) The Xpert Xpress SARS-CoV-2/FLU/RSV plus assay is intended as an aid in the diagnosis of influenza from Nasopharyngeal swab specimens and should not be used as a sole basis for treatment. Nasal washings and aspirates are unacceptable for Xpert Xpress SARS-CoV-2/FLU/RSV testing.  Fact Sheet for Patients: EntrepreneurPulse.com.au  Fact Sheet for Healthcare Providers: IncredibleEmployment.be  This test is not yet approved or cleared by the [place] FDA and has been authorized for detection and/or diagnosis of SARS-CoV-2 by FDA under an Emergency Use Authorization (EUA). This EUA will remain in effect  (meaning this test can be used) for the duration of the COVID-19 declaration under Section 564(b)(1) of the Act, 21 U.S.C. section 360bbb-3(b)(1), unless the authorization is terminated or revoked.  Performed at [facility], [address] [place],  [address]   Blood Culture ID Panel (Reflexed)     Status: Abnormal   Collection Time: 09/02/20  5:13 PM  Result Value Ref Range   Enterococcus faecalis NOT DETECTED NOT DETECTED   Enterococcus Faecium NOT DETECTED NOT DETECTED   Listeria monocytogenes NOT DETECTED NOT DETECTED   Staphylococcus species DETECTED (A) NOT DETECTED    Comment: CRITICAL RESULT CALLED TO, READ BACK BY AND VERIFIED WITH:  AMY THOMPSON AT 1004 09/03/20 SDR    Staphylococcus aureus (BCID) NOT DETECTED NOT DETECTED   Staphylococcus epidermidis DETECTED (A) NOT DETECTED    Comment: Methicillin (oxacillin) resistant coagulase negative staphylococcus. Possible blood culture contaminant (unless isolated from more than one blood culture draw or clinical case suggests pathogenicity). No antibiotic treatment is indicated for blood  culture contaminants. CRITICAL RESULT CALLED TO, READ BACK BY AND VERIFIED WITH:  AMY THOMPSON AT 1004 09/03/20 SDR    Staphylococcus lugdunensis NOT DETECTED NOT DETECTED   Streptococcus species NOT DETECTED NOT DETECTED   Streptococcus agalactiae NOT DETECTED NOT DETECTED   Streptococcus pneumoniae NOT DETECTED NOT DETECTED   Streptococcus pyogenes NOT DETECTED NOT DETECTED   A.calcoaceticus-baumannii NOT DETECTED NOT DETECTED   Bacteroides fragilis NOT DETECTED NOT DETECTED   Enterobacterales NOT DETECTED NOT DETECTED   Enterobacter cloacae complex NOT DETECTED NOT DETECTED   Escherichia coli NOT DETECTED NOT DETECTED   Klebsiella aerogenes NOT DETECTED NOT DETECTED   Klebsiella oxytoca NOT DETECTED NOT DETECTED   Klebsiella pneumoniae NOT DETECTED NOT DETECTED   Proteus species NOT DETECTED NOT DETECTED   Salmonella  species NOT DETECTED NOT DETECTED   Serratia marcescens NOT DETECTED NOT DETECTED   Haemophilus influenzae NOT DETECTED NOT DETECTED   Neisseria meningitidis NOT DETECTED NOT DETECTED   Pseudomonas aeruginosa NOT DETECTED NOT DETECTED   Stenotrophomonas maltophilia NOT DETECTED NOT DETECTED   Candida albicans NOT DETECTED NOT DETECTED   Candida auris NOT DETECTED NOT DETECTED   Candida glabrata NOT  DETECTED NOT DETECTED   Candida krusei NOT DETECTED NOT DETECTED   Candida parapsilosis NOT DETECTED NOT DETECTED   Candida tropicalis NOT DETECTED NOT DETECTED   Cryptococcus neoformans/gattii NOT DETECTED NOT DETECTED   Methicillin resistance mecA/C DETECTED (A) NOT DETECTED    Comment: CRITICAL RESULT CALLED TO, READ BACK BY AND VERIFIED WITH:  AMY THOMPSON AT 1004 09/03/20 SDR Performed at [facility], [address] [place], [place] [address]   Lactic acid, plasma     Status: Abnormal   Collection Time: 09/02/20  5:25 PM  Result Value Ref Range   Lactic Acid, Venous 2.8 (HH) 0.5 - 1.9 mmol/L    Comment: CRITICAL VALUE NOTED. VALUE IS CONSISTENT WITH PREVIOUSLY REPORTED/CALLED VALUE [facility] Performed at [facility], [address] [place], [place] [address]   Blood culture (routine x 2)     Status: Abnormal   Collection Time: 09/02/20  5:25 PM   Specimen: BLOOD  Result Value Ref Range   Specimen Description      BLOOD BLOOD RIGHT HAND Performed at [facility], [address] [place], [place] [address]    Special Requests      BOTTLES DRAWN AEROBIC AND ANAEROBIC Blood Culture results may not be optimal due to an inadequate volume of blood received in culture bottles Performed at [facility], [address] [place], [place] [address]    Culture  Setup Time      Organism ID to follow [place].  VALUE IS CONSISTENT WITH PREVIOUSLY REPORTED AND CALLED VALUE. GRAM POSITIVE COCCI  ANAEROBIC BOTTLE ONLY CRITICAL RESULT CALLED TO, READ BACK BY AND VERIFIED WITH: AMY THOMPSON AT 1004 09/03/20 SDR Performed at [facility], [address] [place], [place] [address]    Culture (A)     GROUP B STREP(S.AGALACTIAE)ISOLATED SUSCEPTIBILITIES PERFORMED ON PREVIOUS CULTURE WITHIN THE LAST 5 DAYS. STAPHYLOCOCCUS HAEMOLYTICUS THE SIGNIFICANCE OF ISOLATING THIS ORGANISM FROM A SINGLE SET OF BLOOD CULTURES WHEN MULTIPLE SETS ARE DRAWN IS UNCERTAIN. PLEASE NOTIFY THE MICROBIOLOGY DEPARTMENT WITHIN ONE WEEK IF SPECIATION AND SENSITIVITIES ARE REQUIRED. Performed at [facility], [place] [address], [place], [place] [address]    Report Status 09/06/2020 FINAL   Blood culture (routine x 2)     Status: Abnormal   Collection Time: 09/02/20  5:25 PM   Specimen: BLOOD  Result Value Ref Range   Specimen Description      BLOOD BLOOD LEFT HAND Performed at [facility], [address] [place], [place] [address]    Special Requests      BOTTLES DRAWN AEROBIC AND ANAEROBIC Blood Culture results may not be optimal due to an inadequate volume of blood received in culture bottles Performed at [facility], [address] [place], [place] [address]    Culture  Setup Time      Organism ID to follow GRAM POSITIVE COCCI IN CHAINS IN BOTH AEROBIC AND ANAEROBIC BOTTLES CRITICAL RESULT CALLED TO, READ BACK BY AND VERIFIED WITH: JASON ROBBINS AT 3762 09/03/20 [place] Performed at [facility], [address] [place], [place] [address]    Culture (A)     GROUP B STREP(S.AGALACTIAE)ISOLATED METHICILLIN RESISTANT STAPHYLOCOCCUS AUREUS CRITICAL RESULT CALLED TO, READ BACK BY AND VERIFIED WITH: PHARMD THOMPSON, A. [id number] FCP Performed at [facility], [place] [address], [place],  [address]    Report Status 09/06/2020 FINAL    Organism  ID, Bacteria GROUP B STREP(S.AGALACTIAE)ISOLATED    Organism ID, Bacteria METHICILLIN RESISTANT  STAPHYLOCOCCUS AUREUS       Susceptibility   Group b strep(s.agalactiae)isolated - MIC*    CLINDAMYCIN >=1 RESISTANT Resistant     AMPICILLIN <=0.25 SENSITIVE Sensitive     ERYTHROMYCIN >=8 RESISTANT Resistant     VANCOMYCIN 0.5 SENSITIVE Sensitive     CEFTRIAXONE <=0.12 SENSITIVE Sensitive     LEVOFLOXACIN 1 SENSITIVE Sensitive     * GROUP B STREP(S.AGALACTIAE)ISOLATED   Methicillin resistant staphylococcus aureus - MIC*    CIPROFLOXACIN >=8 RESISTANT Resistant     ERYTHROMYCIN >=8 RESISTANT Resistant     GENTAMICIN <=0.5 SENSITIVE Sensitive     OXACILLIN >=4 RESISTANT Resistant     TETRACYCLINE >=16 RESISTANT Resistant     VANCOMYCIN 1 SENSITIVE Sensitive     TRIMETH/SULFA >=320 RESISTANT Resistant     CLINDAMYCIN >=8 RESISTANT Resistant     RIFAMPIN <=0.5 SENSITIVE Sensitive     Inducible Clindamycin NEGATIVE Sensitive     * METHICILLIN RESISTANT STAPHYLOCOCCUS AUREUS  Urinalysis, Complete w Microscopic Urine, Catheterized     Status: Abnormal   Collection Time: 09/02/20  5:25 PM  Result Value Ref Range   Color, Urine YELLOW (A) YELLOW   APPearance HAZY (A) CLEAR   Specific Gravity, Urine 1.023 1.005 - 1.030   pH 5.0 5.0 - 8.0   Glucose, UA NEGATIVE NEGATIVE mg/dL   Hgb urine dipstick LARGE (A) NEGATIVE   Bilirubin Urine NEGATIVE NEGATIVE   Ketones, ur NEGATIVE NEGATIVE mg/dL   Protein, ur 30 (A) NEGATIVE mg/dL   Nitrite NEGATIVE NEGATIVE   Leukocytes,Ua NEGATIVE NEGATIVE   RBC / HPF >50 (H) 0 - 5 RBC/hpf   WBC, UA 6-10 0 - 5 WBC/hpf   Bacteria, UA RARE (A) NONE SEEN   Squamous Epithelial / LPF 0-5 0 - 5   Mucus PRESENT    Hyaline Casts, UA PRESENT     Comment: Performed at [facility], [address], [place], [place] [address]  Urine Culture     Status: Abnormal   Collection Time: 09/02/20  5:25 PM   Specimen: Urine, Random  Result Value Ref Range   Specimen Description      URINE, RANDOM Performed at [facility], [address],  [place], [place] [address]    Special Requests      NONE Performed at [facility], [address] [place], [place] [address]    Culture 70,000 COLONIES/mL PROTEUS MIRABILIS (A)    Report Status 09/04/2020 FINAL    Organism ID, Bacteria PROTEUS MIRABILIS (A)       Susceptibility   Proteus mirabilis - MIC*    AMPICILLIN <=2 SENSITIVE Sensitive     CEFAZOLIN 8 SENSITIVE Sensitive     CEFEPIME <=0.12 SENSITIVE Sensitive     CEFTRIAXONE <=0.25 SENSITIVE Sensitive     CIPROFLOXACIN <=0.25 SENSITIVE Sensitive     GENTAMICIN <=1 SENSITIVE Sensitive     IMIPENEM 1 SENSITIVE Sensitive     NITROFURANTOIN 128 RESISTANT Resistant     TRIMETH/SULFA <=20 SENSITIVE Sensitive     AMPICILLIN/SULBACTAM <=2 SENSITIVE Sensitive     PIP/TAZO <=4 SENSITIVE Sensitive     * 70,000 COLONIES/mL PROTEUS MIRABILIS  Strep pneumoniae urinary antigen     Status: None   Collection Time: 09/02/20  5:25 PM  Result Value Ref Range   Strep Pneumo Urinary Antigen NEGATIVE NEGATIVE    Comment: PERFORMED AT [facility]  Infection due to S. pneumoniae cannot be absolutely ruled out since the antigen present may be below the detection limit of the test. Performed at [facility], [address] [place], [place] [address]   Blood Culture ID Panel (Reflexed)     Status: Abnormal   Collection Time: 09/02/20  5:25 PM  Result Value Ref Range   Enterococcus faecalis NOT DETECTED NOT DETECTED   Enterococcus Faecium NOT DETECTED NOT DETECTED   Listeria monocytogenes NOT DETECTED NOT DETECTED   Staphylococcus species NOT DETECTED NOT DETECTED   Staphylococcus aureus (BCID) NOT DETECTED NOT DETECTED   Staphylococcus epidermidis NOT DETECTED NOT DETECTED   Staphylococcus lugdunensis NOT DETECTED NOT DETECTED   Streptococcus species DETECTED (A) NOT DETECTED    Comment: CRITICAL RESULT CALLED TO, READ BACK BY AND VERIFIED WITH: JASON ROBBINS AT 0715 09/03/20 SDR    Streptococcus agalactiae  DETECTED (A) NOT DETECTED    Comment: CRITICAL RESULT CALLED TO, READ BACK BY AND VERIFIED WITH:  JASON ROBBINS AT 0715 09/03/20 SDR    Streptococcus pneumoniae NOT DETECTED NOT DETECTED   Streptococcus pyogenes NOT DETECTED NOT DETECTED   A.calcoaceticus-baumannii NOT DETECTED NOT DETECTED   Bacteroides fragilis NOT DETECTED NOT DETECTED   Enterobacterales NOT DETECTED NOT DETECTED   Enterobacter cloacae complex NOT DETECTED NOT DETECTED   Escherichia coli NOT DETECTED NOT DETECTED   Klebsiella aerogenes NOT DETECTED NOT DETECTED   Klebsiella oxytoca NOT DETECTED NOT DETECTED   Klebsiella pneumoniae NOT DETECTED NOT DETECTED   Proteus species NOT DETECTED NOT DETECTED   Salmonella species NOT DETECTED NOT DETECTED   Serratia marcescens NOT DETECTED NOT DETECTED   Haemophilus influenzae NOT DETECTED NOT DETECTED   Neisseria meningitidis NOT DETECTED NOT DETECTED   Pseudomonas aeruginosa NOT DETECTED NOT DETECTED   Stenotrophomonas maltophilia NOT DETECTED NOT DETECTED   Candida albicans NOT DETECTED NOT DETECTED   Candida auris NOT DETECTED NOT DETECTED   Candida glabrata NOT DETECTED NOT DETECTED   Candida krusei NOT DETECTED NOT DETECTED   Candida parapsilosis NOT DETECTED NOT DETECTED   Candida tropicalis NOT DETECTED NOT DETECTED   Cryptococcus neoformans/gattii NOT DETECTED NOT DETECTED    Comment: Performed at [facility], [address] [place], [place] [address]  CBC with Differential     Status: Abnormal   Collection Time: 09/03/20  4:15 AM  Result Value Ref Range   WBC 7.3 4.0 - 10.5 K/uL   RBC 3.54 (L) 3.87 - 5.11 MIL/uL   Hemoglobin 10.8 (L) 12.0 - 15.0 g/dL   HCT 33.6 (L) 36.0 - 46.0 %   MCV 94.9 80.0 - 100.0 fL   MCH 30.5 26.0 - 34.0 pg   MCHC 32.1 30.0 - 36.0 g/dL   RDW 17.2 (H) 11.5 - 15.5 %   Platelets 111 (L) 150 - 400 K/uL    Comment: Immature Platelet Fraction may be clinically indicated, consider ordering this additional test [id number]     nRBC 0.0 0.0 - 0.2 %   Neutrophils Relative % 89 %   Neutro Abs 6.5 1.7 - 7.7 K/uL   Lymphocytes Relative 6 %   Lymphs Abs 0.4 (L) 0.7 - 4.0 K/uL   Monocytes Relative 4 %   Monocytes Absolute 0.3 0.1 - 1.0 K/uL   Eosinophils Relative 1 %   Eosinophils Absolute 0.1 0.0 - 0.5 K/uL   Basophils Relative 0 %   Basophils Absolute 0.0 0.0 - 0.1 K/uL   Immature Granulocytes 0 %   Abs Immature  Granulocytes 0.02 0.00 - 0.07 K/uL    Comment: Performed at [facility], [address] [place], [place] [address]  Comprehensive metabolic panel     Status: Abnormal   Collection Time: 09/03/20  4:15 AM  Result Value Ref Range   Sodium 136 135 - 145 mmol/L   Potassium 3.6 3.5 - 5.1 mmol/L   Chloride 103 98 - 111 mmol/L   CO2 24 22 - 32 mmol/L   Glucose, Bld 78 70 - 99 mg/dL    Comment: Glucose reference range applies only to samples taken after fasting for at least 8 hours.   BUN 17 8 - 23 mg/dL   Creatinine, Ser 1.18 (H) 0.44 - 1.00 mg/dL   Calcium 8.3 (L) 8.9 - 10.3 mg/dL   Total Protein 7.5 6.5 - 8.1 g/dL   Albumin 3.1 (L) 3.5 - 5.0 g/dL   AST 34 15 - 41 U/L   ALT 18 0 - 44 U/L   Alkaline Phosphatase 81 38 - 126 U/L   Total Bilirubin 1.4 (H) 0.3 - 1.2 mg/dL   GFR, Estimated 48 (L) >60 mL/min    Comment: (NOTE) Calculated using the CKD-EPI Creatinine Equation (2021)    Anion gap 9 5 - 15    Comment: Performed at [facility], [address] [place], [place] [address]  Lactic acid, plasma     Status: None   Collection Time: 09/03/20  4:15 AM  Result Value Ref Range   Lactic Acid, Venous 1.0 0.5 - 1.9 mmol/L    Comment: Performed at [facility], [address] [place], [place] [address]  CBC with Differential     Status: Abnormal   Collection Time: 09/04/20  4:06 AM  Result Value Ref Range   WBC 7.1 4.0 - 10.5 K/uL   RBC 3.43 (L) 3.87 - 5.11 MIL/uL   Hemoglobin 10.7 (L) 12.0 - 15.0 g/dL   HCT 32.8 (L) 36.0 - 46.0 %   MCV 95.6 80.0 - 100.0 fL   MCH  31.2 26.0 - 34.0 pg   MCHC 32.6 30.0 - 36.0 g/dL   RDW 17.2 (H) 11.5 - 15.5 %   Platelets 110 (L) 150 - 400 K/uL    Comment: Immature Platelet Fraction may be clinically indicated, consider ordering this additional test [id number]    nRBC 0.0 0.0 - 0.2 %   Neutrophils Relative % 80 %   Neutro Abs 5.7 1.7 - 7.7 K/uL   Lymphocytes Relative 8 %   Lymphs Abs 0.6 (L) 0.7 - 4.0 K/uL   Monocytes Relative 7 %   Monocytes Absolute 0.5 0.1 - 1.0 K/uL   Eosinophils Relative 4 %   Eosinophils Absolute 0.3 0.0 - 0.5 K/uL   Basophils Relative 0 %   Basophils Absolute 0.0 0.0 - 0.1 K/uL   Immature Granulocytes 1 %   Abs Immature Granulocytes 0.05 0.00 - 0.07 K/uL    Comment: Performed at [facility], [address] [place], [place] [address]  Comprehensive metabolic panel     Status: Abnormal   Collection Time: 09/04/20  4:06 AM  Result Value Ref Range   Sodium 136 135 - 145 mmol/L   Potassium 3.6 3.5 - 5.1 mmol/L   Chloride 102 98 - 111 mmol/L   CO2 26 22 - 32 mmol/L   Glucose, Bld 96 70 - 99 mg/dL    Comment: Glucose reference range applies only to samples taken after fasting for at least 8 hours.  BUN 15 8 - 23 mg/dL   Creatinine, Ser 1.19 (H) 0.44 - 1.00 mg/dL   Calcium 8.7 (L) 8.9 - 10.3 mg/dL   Total Protein 7.5 6.5 - 8.1 g/dL   Albumin 3.0 (L) 3.5 - 5.0 g/dL   AST 27 15 - 41 U/L   ALT 18 0 - 44 U/L   Alkaline Phosphatase 81 38 - 126 U/L   Total Bilirubin 1.1 0.3 - 1.2 mg/dL   GFR, Estimated 48 (L) >60 mL/min    Comment: (NOTE) Calculated using the CKD-EPI Creatinine Equation (2021)    Anion gap 8 5 - 15    Comment: Performed at [facility], [address] [place], [place] [address]  CBC with Differential     Status: Abnormal   Collection Time: 09/05/20  4:16 AM  Result Value Ref Range   WBC 6.2 4.0 - 10.5 K/uL   RBC 3.78 (L) 3.87 - 5.11 MIL/uL   Hemoglobin 11.5 (L) 12.0 - 15.0 g/dL   HCT 36.0 36.0 - 46.0 %   MCV 95.2 80.0 - 100.0 fL   MCH 30.4  26.0 - 34.0 pg   MCHC 31.9 30.0 - 36.0 g/dL   RDW 17.1 (H) 11.5 - 15.5 %   Platelets 120 (L) 150 - 400 K/uL   nRBC 0.0 0.0 - 0.2 %   Neutrophils Relative % 71 %   Neutro Abs 4.4 1.7 - 7.7 K/uL   Lymphocytes Relative 14 %   Lymphs Abs 0.9 0.7 - 4.0 K/uL   Monocytes Relative 9 %   Monocytes Absolute 0.5 0.1 - 1.0 K/uL   Eosinophils Relative 5 %   Eosinophils Absolute 0.3 0.0 - 0.5 K/uL   Basophils Relative 0 %   Basophils Absolute 0.0 0.0 - 0.1 K/uL   Immature Granulocytes 1 %   Abs Immature Granulocytes 0.08 (H) 0.00 - 0.07 K/uL    Comment: Performed at [facility], [address] [place], [place] [address]  Basic metabolic panel     Status: Abnormal   Collection Time: 09/05/20  4:16 AM  Result Value Ref Range   Sodium 136 135 - 145 mmol/L   Potassium 4.1 3.5 - 5.1 mmol/L   Chloride 101 98 - 111 mmol/L   CO2 24 22 - 32 mmol/L   Glucose, Bld 94 70 - 99 mg/dL    Comment: Glucose reference range applies only to samples taken after fasting for at least 8 hours.   BUN 13 8 - 23 mg/dL   Creatinine, Ser 1.13 (H) 0.44 - 1.00 mg/dL   Calcium 8.8 (L) 8.9 - 10.3 mg/dL   GFR, Estimated 51 (L) >60 mL/min    Comment: (NOTE) Calculated using the CKD-EPI Creatinine Equation (2021)    Anion gap 11 5 - 15    Comment: Performed at [facility], [address] [place], [place] [address]  Culture, blood (Routine X 2) w Reflex to ID Panel     Status: None   Collection Time: 09/05/20  3:02 PM   Specimen: BLOOD  Result Value Ref Range   Specimen Description BLOOD RIGHT ANTECUBITAL    Special Requests      BOTTLES DRAWN AEROBIC AND ANAEROBIC Blood Culture adequate volume   Culture      NO GROWTH 5 DAYS Performed at [facility], [address] [place], [place] [address]    Report Status 09/10/2020 FINAL   Culture, blood (Routine X 2) w Reflex to ID Panel  Status: None   Collection Time: 09/05/20  3:04 PM   Specimen: BLOOD  Result Value Ref Range    Specimen Description BLOOD BLOOD LEFT HAND    Special Requests      BOTTLES DRAWN AEROBIC AND ANAEROBIC Blood Culture adequate volume   Culture      NO GROWTH 5 DAYS Performed at [facility], [address] [place], [place] [address]    Report Status 09/10/2020 FINAL   CBC with Differential     Status: Abnormal   Collection Time: 09/06/20  4:16 AM  Result Value Ref Range   WBC 6.4 4.0 - 10.5 K/uL   RBC 3.79 (L) 3.87 - 5.11 MIL/uL   Hemoglobin 11.8 (L) 12.0 - 15.0 g/dL   HCT 35.8 (L) 36.0 - 46.0 %   MCV 94.5 80.0 - 100.0 fL   MCH 31.1 26.0 - 34.0 pg   MCHC 33.0 30.0 - 36.0 g/dL   RDW 17.3 (H) 11.5 - 15.5 %   Platelets 140 (L) 150 - 400 K/uL   nRBC 0.0 0.0 - 0.2 %   Neutrophils Relative % 71 %   Neutro Abs 4.6 1.7 - 7.7 K/uL   Lymphocytes Relative 13 %   Lymphs Abs 0.9 0.7 - 4.0 K/uL   Monocytes Relative 9 %   Monocytes Absolute 0.6 0.1 - 1.0 K/uL   Eosinophils Relative 4 %   Eosinophils Absolute 0.3 0.0 - 0.5 K/uL   Basophils Relative 1 %   Basophils Absolute 0.0 0.0 - 0.1 K/uL   Immature Granulocytes 2 %   Abs Immature Granulocytes 0.15 (H) 0.00 - 0.07 K/uL    Comment: Performed at [facility], [address] [place], [place] [address]  TSH     Status: Abnormal   Collection Time: 09/06/20  4:17 AM  Result Value Ref Range   TSH 9.079 (H) 0.350 - 4.500 uIU/mL    Comment: Performed by a 3rd Generation assay with a functional sensitivity of <=0.01 uIU/mL. Performed at [facility], [address] [place], [place] [address]   Basic metabolic panel     Status: None   Collection Time: 09/06/20  4:17 AM  Result Value Ref Range   Sodium 137 135 - 145 mmol/L   Potassium 3.9 3.5 - 5.1 mmol/L   Chloride 102 98 - 111 mmol/L   CO2 24 22 - 32 mmol/L   Glucose, Bld 87 70 - 99 mg/dL    Comment: Glucose reference range applies only to samples taken after fasting for at least 8 hours.   BUN 14 8 - 23 mg/dL   Creatinine, Ser 0.98 0.44 - 1.00 mg/dL    Calcium 8.9 8.9 - 10.3 mg/dL   GFR, Estimated >60 >60 mL/min    Comment: (NOTE) Calculated using the CKD-EPI Creatinine Equation (2021)    Anion gap 11 5 - 15    Comment: Performed at [facility], [address] [place], [place] [address]  Magnesium     Status: None   Collection Time: 09/06/20  4:17 AM  Result Value Ref Range   Magnesium 2.4 1.7 - 2.4 mg/dL    Comment: Performed at [facility], [address] [place], [place] [address]  MRSA PCR Screening     Status: Abnormal   Collection Time: 09/06/20 12:04 PM   Specimen: Nasopharyngeal  Result Value Ref Range   MRSA by PCR POSITIVE (A) NEGATIVE    Comment:        The  GeneXpert MRSA Assay (FDA approved for NASAL specimens only), is one component of a comprehensive MRSA colonization surveillance program. It is not intended to diagnose MRSA infection nor to guide or monitor treatment for MRSA infections. RESULT CALLED TO, READ BACK BY AND VERIFIED WITH: KAYLA BRITTON AT 1330 ON 09/06/2020 BY SDR. [facility] Performed at [facility], [address] [place], [place] [address]   CBC     Status: Abnormal   Collection Time: 09/07/20  5:10 AM  Result Value Ref Range   WBC 7.2 4.0 - 10.5 K/uL   RBC 3.56 (L) 3.87 - 5.11 MIL/uL   Hemoglobin 10.8 (L) 12.0 - 15.0 g/dL   HCT 33.7 (L) 36.0 - 46.0 %   MCV 94.7 80.0 - 100.0 fL   MCH 30.3 26.0 - 34.0 pg   MCHC 32.0 30.0 - 36.0 g/dL   RDW 17.2 (H) 11.5 - 15.5 %   Platelets 158 150 - 400 K/uL   nRBC 0.7 (H) 0.0 - 0.2 %    Comment: Performed at [facility], [address] [place], [place] [address]  Basic metabolic panel     Status: Abnormal   Collection Time: 09/07/20  5:10 AM  Result Value Ref Range   Sodium 136 135 - 145 mmol/L   Potassium 3.9 3.5 - 5.1 mmol/L   Chloride 102 98 - 111 mmol/L   CO2 24 22 - 32 mmol/L   Glucose, Bld 91 70 - 99 mg/dL    Comment: Glucose reference range applies only to samples taken after fasting for at least 8  hours.   BUN 19 8 - 23 mg/dL   Creatinine, Ser 1.10 (H) 0.44 - 1.00 mg/dL   Calcium 8.9 8.9 - 10.3 mg/dL   GFR, Estimated 53 (L) >60 mL/min    Comment: (NOTE) Calculated using the CKD-EPI Creatinine Equation (2021)    Anion gap 10 5 - 15    Comment: Performed at [facility], [address] [place], [place] [address]  ECHOCARDIOGRAM COMPLETE     Status: None   Collection Time: 09/07/20  9:08 AM  Result Value Ref Range   Weight 4,480 oz   Height 71 in   BP 108/65 mmHg   Ao pk vel 1.43 m/s   AV Area VTI 3.46 cm2   AR max vel 2.88 cm2   AV Mean grad 4.0 mmHg   AV Peak grad 8.2 mmHg   S' Lateral 2.40 cm   AV Area mean vel 3.11 cm2   Area-P 1/2 6.77 cm2  Creatinine, serum     Status: Abnormal   Collection Time: 09/08/20  3:48 AM  Result Value Ref Range   Creatinine, Ser 1.17 (H) 0.44 - 1.00 mg/dL   GFR, Estimated 49 (L) >60 mL/min    Comment: (NOTE) Calculated using the CKD-EPI Creatinine Equation (2021) Performed at [facility], [address] [place], [place] [address]   Basic metabolic panel     Status: Abnormal   Collection Time: 09/09/20  4:08 AM  Result Value Ref Range   Sodium 137 135 - 145 mmol/L   Potassium 3.7 3.5 - 5.1 mmol/L   Chloride 103 98 - 111 mmol/L   CO2 26 22 - 32 mmol/L   Glucose, Bld 87 70 - 99 mg/dL    Comment: Glucose reference range applies only to samples taken after fasting for at least 8 hours.   BUN 21 8 - 23 mg/dL   Creatinine, Ser 1.18 (H) 0.44 -  1.00 mg/dL   Calcium 8.8 (L) 8.9 - 10.3 mg/dL   GFR, Estimated 48 (L) >60 mL/min    Comment: (NOTE) Calculated using the CKD-EPI Creatinine Equation (2021)    Anion gap 8 5 - 15    Comment: Performed at [facility], [address] [place], [place] [address]  CBC     Status: Abnormal   Collection Time: 09/09/20  4:08 AM  Result Value Ref Range   WBC 5.9 4.0 - 10.5 K/uL   RBC 3.56 (L) 3.87 - 5.11 MIL/uL   Hemoglobin 10.9 (L) 12.0 - 15.0 g/dL   HCT 33.7 (L)  36.0 - 46.0 %   MCV 94.7 80.0 - 100.0 fL   MCH 30.6 26.0 - 34.0 pg   MCHC 32.3 30.0 - 36.0 g/dL   RDW 17.2 (H) 11.5 - 15.5 %   Platelets 196 150 - 400 K/uL   nRBC 0.7 (H) 0.0 - 0.2 %    Comment: Performed at [facility], [address] [place], [place] [address]  Basic metabolic panel     Status: Abnormal   Collection Time: 09/10/20  3:01 AM  Result Value Ref Range   Sodium 136 135 - 145 mmol/L   Potassium 4.2 3.5 - 5.1 mmol/L   Chloride 102 98 - 111 mmol/L   CO2 26 22 - 32 mmol/L   Glucose, Bld 94 70 - 99 mg/dL    Comment: Glucose reference range applies only to samples taken after fasting for at least 8 hours.   BUN 23 8 - 23 mg/dL   Creatinine, Ser 1.14 (H) 0.44 - 1.00 mg/dL   Calcium 8.6 (L) 8.9 - 10.3 mg/dL   GFR, Estimated 51 (L) >60 mL/min    Comment: (NOTE) Calculated using the CKD-EPI Creatinine Equation (2021)    Anion gap 8 5 - 15    Comment: Performed at [facility], [address], [place], [place] [address]  Magnesium     Status: None   Collection Time: 09/10/20  3:01 AM  Result Value Ref Range   Magnesium 2.3 1.7 - 2.4 mg/dL    Comment: Performed at [facility], [address] [place], [place] [address]  Vancomycin, trough     Status: Abnormal   Collection Time: 09/10/20  4:17 PM  Result Value Ref Range   Vancomycin Tr 22 (HH) 15 - 20 ug/mL    Comment: CRITICAL RESULT CALLED TO, READ BACK BY AND VERIFIED WITH JASON ROBINS @1713  ON 09/10/20 SKL Performed at [facility], [address], [place], [place] [address]   Basic metabolic panel     Status: Abnormal   Collection Time: 09/12/20  4:16 AM  Result Value Ref Range   Sodium 139 135 - 145 mmol/L   Potassium 4.0 3.5 - 5.1 mmol/L   Chloride 99 98 - 111 mmol/L   CO2 30 22 - 32 mmol/L   Glucose, Bld 76 70 - 99 mg/dL    Comment: Glucose reference range applies only to samples taken after fasting for at least 8 hours.   BUN 26 (H) 8 - 23 mg/dL   Creatinine, Ser  1.28 (H) 0.44 - 1.00 mg/dL   Calcium 9.2 8.9 - 10.3 mg/dL   GFR, Estimated 44 (L) >60 mL/min    Comment: (NOTE) Calculated using the CKD-EPI Creatinine Equation (2021)    Anion gap 10 5 - 15    Comment: Performed at [facility], [address], [place], [place] [address]  CBC     Status: Abnormal   Collection Time: 09/12/20  4:16 AM  Result Value Ref Range   WBC 6.7 4.0 - 10.5 K/uL   RBC 3.72 (L) 3.87 - 5.11 MIL/uL   Hemoglobin 11.5 (L) 12.0 - 15.0 g/dL   HCT 35.2 (L) 36.0 - 46.0 %   MCV 94.6 80.0 - 100.0 fL   MCH 30.9 26.0 - 34.0 pg   MCHC 32.7 30.0 - 36.0 g/dL   RDW 17.0 (H) 11.5 - 15.5 %   Platelets 253 150 - 400 K/uL   nRBC 0.0 0.0 - 0.2 %    Comment: Performed at [facility], [address] [place], [place] [address]  Basic metabolic panel     Status: Abnormal   Collection Time: 09/13/20  5:59 AM  Result Value Ref Range   Sodium 137 135 - 145 mmol/L   Potassium 4.2 3.5 - 5.1 mmol/L   Chloride 97 (L) 98 - 111 mmol/L   CO2 30 22 - 32 mmol/L   Glucose, Bld 90 70 - 99 mg/dL    Comment: Glucose reference range applies only to samples taken after fasting for at least 8 hours.   BUN 28 (H) 8 - 23 mg/dL   Creatinine, Ser 1.28 (H) 0.44 - 1.00 mg/dL   Calcium 9.5 8.9 - 10.3 mg/dL   GFR, Estimated 44 (L) >60 mL/min    Comment: (NOTE) Calculated using the CKD-EPI Creatinine Equation (2021)    Anion gap 10 5 - 15    Comment: Performed at [facility], [address], [place], [place] [address]  Magnesium     Status: None   Collection Time: 09/13/20  5:59 AM  Result Value Ref Range   Magnesium 2.2 1.7 - 2.4 mg/dL    Comment: Performed at [facility], [address] [place], [place] [address]  Vancomycin, trough     Status: Abnormal   Collection Time: 09/13/20  5:29 PM  Result Value Ref Range   Vancomycin Tr 23 (HH) 15 - 20 ug/mL    Comment: CRITICAL RESULT CALLED TO, READ BACK BY AND VERIFIED WITH BRANDON BEERS 09/13/20 AT 1749 BY  ACR Performed at [facility], [address] [place], [place] [address]   Basic metabolic panel     Status: Abnormal   Collection Time: 09/14/20  8:20 AM  Result Value Ref Range   Sodium 137 135 - 145 mmol/L   Potassium 4.1 3.5 - 5.1 mmol/L   Chloride 96 (L) 98 - 111 mmol/L   CO2 29 22 - 32 mmol/L   Glucose, Bld 99 70 - 99 mg/dL    Comment: Glucose reference range applies only to samples taken after fasting for at least 8 hours.   BUN 28 (H) 8 - 23 mg/dL   Creatinine, Ser 1.31 (H) 0.44 - 1.00 mg/dL   Calcium 9.4 8.9 - 10.3 mg/dL   GFR, Estimated 43 (L) >60 mL/min    Comment: (NOTE) Calculated using the CKD-EPI Creatinine Equation (2021)    Anion gap 12 5 - 15    Comment: Performed at [facility], [address] [place], [place] [address]  CK     Status: None   Collection Time: 09/14/20  8:20 AM  Result Value Ref Range   Total CK 101 38 - 234 U/L    Comment: Performed at [facility], [address] [place], [place] [address]  BASIC METABOLIC PANEL WITH GFR  Status: Abnormal   Collection Time: 09/28/20 12:00 AM  Result Value Ref Range   Glucose, Bld 79 65 - 99 mg/dL    Comment: .            Fasting reference interval .    BUN 20 7 - 25 mg/dL   Creat 1.28 (H) 0.60 - 0.93 mg/dL    Comment: For patients >59 years of age, the reference limit for Creatinine is approximately 13% higher for people identified as African-American. .    GFR, Est Non African American 41 (L) > OR = 60 mL/min/1.14m   GFR, Est African American 48 (L) > OR = 60 mL/min/1.748m  BUN/Creatinine Ratio 16 6 - 22 (calc)   Sodium 136 135 - 146 mmol/L   Potassium 4.3 3.5 - 5.3 mmol/L   Chloride 101 98 - 110 mmol/L   CO2 25 20 - 32 mmol/L   Calcium 9.5 8.6 - 10.4 mg/dL  CBC with Differential/Platelet     Status: Abnormal   Collection Time: 09/28/20 12:00 AM  Result Value Ref Range   WBC 5.2 3.8 - 10.8 Thousand/uL   RBC 3.93 3.80 - 5.10 Million/uL   Hemoglobin 12.0  11.7 - 15.5 g/dL   HCT 36.8 35.0 - 45.0 %   MCV 93.6 80.0 - 100.0 fL   MCH 30.5 27.0 - 33.0 pg   MCHC 32.6 32.0 - 36.0 g/dL   RDW 15.1 (H) 11.0 - 15.0 %   Platelets 196 140 - 400 Thousand/uL   MPV 10.9 7.5 - 12.5 fL   Neutro Abs 3,515 1,500 - 7,800 cells/uL   Lymphs Abs 1,066 850 - 3,900 cells/uL   Absolute Monocytes 374 200 - 950 cells/uL   Eosinophils Absolute 224 15 - 500 cells/uL   Basophils Absolute 21 0 - 200 cells/uL   Neutrophils Relative % 67.6 %   Total Lymphocyte 20.5 %   Monocytes Relative 7.2 %   Eosinophils Relative 4.3 %   Basophils Relative 0.4 %  CK     Status: Abnormal   Collection Time: 09/28/20 12:00 AM  Result Value Ref Range   Total CK 239 (H) 29 - 143 U/L  POCT HgB A1C     Status: Abnormal   Collection Time: 09/28/20 11:51 AM  Result Value Ref Range   Hemoglobin A1C 6.1 (A) 4.0 - 5.6 %   HbA1c POC (<> result, manual entry)     HbA1c, POC (prediabetic range)     HbA1c, POC (controlled diabetic range)        PHQ2/9: [facility] screen [facility] 11/14/2020 09/28/2020 08/28/2020 07/31/2020 01/27/2020  Decreased Interest 0 0 0 0 0  Down, Depressed, Hopeless 0 0 0 0 0  PHQ - 2 Score 0 0 0 0 0  Altered sleeping - 0 - - 0  Tired, decreased energy - 0 - - 0  Change in appetite - 0 - - 0  Feeling bad or failure about yourself  - 0 - - 0  Trouble concentrating - 0 - - 0  Moving slowly or fidgety/restless - 0 - - 0  Suicidal thoughts - 0 - - 0  PHQ-9 Score - 0 - - 0  Difficult doing work/chores - Not difficult at all - - -  Some recent data might be hidden    phq 9 is negative   Fall Risk: Fall Risk  11/14/2020 09/28/2020 08/28/2020 07/31/2020 05/29/2020  Falls in the past year? 1 1 0 0 0  Number falls in  past yr: 1 1 0 0 0  Comment - - - - -  Injury with Fall? 0 0 0 0 0  Comment - - - - -  Risk for fall due to : - - - Impaired mobility;Impaired balance/gait -  Follow up - - - Falls prevention discussed -     Functional Status Survey: Is the patient  deaf or have difficulty hearing?: No Does the patient have difficulty seeing, even when wearing glasses/contacts?: No Does the patient have difficulty concentrating, remembering, or making decisions?: No Does the patient have difficulty walking or climbing stairs?: Yes Does the patient have difficulty dressing or bathing?: No Does the patient have difficulty doing errands alone such as visiting a doctor's office or shopping?: Yes    Assessment & Plan  .1. Dyslipidemia  - atorvastatin (LIPITOR) 40 MG tablet; Take 1 tablet (40 mg total) by mouth every evening.  Dispense: 90 tablet; Refill: 1  2. Type 2 diabetes mellitus with diabetic neuropathy, without long-term current use of insulin (HCC)   3. Pulmonary hypertension (HCC)  On echo, advised to resume CPAP  4. Chronic deep vein thrombosis (DVT) of femoral vein of right lower extremity (HCC)   5. Stage 3 chronic kidney disease, unspecified whether stage 3a or 3b CKD ([place])   6. Gait difficulty  Able to transfer and stand, short steps at home, we will place another referral to PT when necessary   7. Obstructive apnea  Advised to resume CPAP   8. Benign essential HTN  - metoprolol succinate (TOPROL-XL) 25 MG 24 hr tablet; Take 0.5 tablets (12.5 mg total) by mouth daily.  Dispense: 90 tablet; Refill: 0  9. Lower leg DVT (deep venous thromboembolism), chronic, right ([place])  Under the care of vascular surgeon , cannot take anticoagulants due to bleeding  10. B12 deficiency  Advised to resume supplementation   11. Chronic infection of right knee [facility])  She will see Dr. Ola Spurr soon   12. Lower extremity edema  - torsemide (DEMADEX) 20 MG tablet; Take 1 tablet (20 mg total) by mouth daily.  Dispense: 90 tablet; Refill: 0 She has been taking it prn now

## 2020-11-14 ENCOUNTER — Encounter: Payer: Self-pay | Admitting: Family Medicine

## 2020-11-14 ENCOUNTER — Ambulatory Visit (INDEPENDENT_AMBULATORY_CARE_PROVIDER_SITE_OTHER): Payer: Medicare Other | Admitting: Family Medicine

## 2020-11-14 ENCOUNTER — Other Ambulatory Visit: Payer: Self-pay

## 2020-11-14 VITALS — BP 130/86 | HR 87 | Temp 97.5°F | Resp 16 | Ht 71.0 in | Wt 319.0 lb

## 2020-11-14 DIAGNOSIS — R269 Unspecified abnormalities of gait and mobility: Secondary | ICD-10-CM | POA: Diagnosis not present

## 2020-11-14 DIAGNOSIS — I825Z1 Chronic embolism and thrombosis of unspecified deep veins of right distal lower extremity: Secondary | ICD-10-CM | POA: Diagnosis not present

## 2020-11-14 DIAGNOSIS — N183 Chronic kidney disease, stage 3 unspecified: Secondary | ICD-10-CM

## 2020-11-14 DIAGNOSIS — G4733 Obstructive sleep apnea (adult) (pediatric): Secondary | ICD-10-CM | POA: Diagnosis not present

## 2020-11-14 DIAGNOSIS — E538 Deficiency of other specified B group vitamins: Secondary | ICD-10-CM

## 2020-11-14 DIAGNOSIS — E785 Hyperlipidemia, unspecified: Secondary | ICD-10-CM | POA: Diagnosis not present

## 2020-11-14 DIAGNOSIS — Z23 Encounter for immunization: Secondary | ICD-10-CM

## 2020-11-14 DIAGNOSIS — I272 Pulmonary hypertension, unspecified: Secondary | ICD-10-CM

## 2020-11-14 DIAGNOSIS — E1140 Type 2 diabetes mellitus with diabetic neuropathy, unspecified: Secondary | ICD-10-CM

## 2020-11-14 DIAGNOSIS — R600 Localized edema: Secondary | ICD-10-CM

## 2020-11-14 DIAGNOSIS — Z09 Encounter for follow-up examination after completed treatment for conditions other than malignant neoplasm: Secondary | ICD-10-CM | POA: Diagnosis not present

## 2020-11-14 DIAGNOSIS — M009 Pyogenic arthritis, unspecified: Secondary | ICD-10-CM | POA: Diagnosis not present

## 2020-11-14 DIAGNOSIS — E039 Hypothyroidism, unspecified: Secondary | ICD-10-CM | POA: Diagnosis not present

## 2020-11-14 DIAGNOSIS — I10 Essential (primary) hypertension: Secondary | ICD-10-CM

## 2020-11-14 MED ORDER — ATORVASTATIN CALCIUM 40 MG PO TABS: 40.0000 mg | ORAL_TABLET | Freq: Every evening | ORAL | 1 refills | Status: DC

## 2020-11-14 MED ORDER — METOPROLOL SUCCINATE ER 25 MG PO TB24: 12.5000 mg | ORAL_TABLET | Freq: Every day | ORAL | 0 refills | Status: DC

## 2020-11-14 MED ORDER — TORSEMIDE 20 MG PO TABS: 20.0000 mg | ORAL_TABLET | Freq: Every day | ORAL | 0 refills | Status: DC

## 2020-11-14 MED ORDER — CALCIUM CARBONATE-VITAMIN D 600-400 MG-UNIT PO TABS: 1.0000 | ORAL_TABLET | Freq: Two times a day (BID) | ORAL | 0 refills | Status: AC

## 2020-11-14 NOTE — Patient Instructions (Signed)
Go down to half pill of Midodrine three times  for one week and if bp stays above 120/70 stop taking it  Go down on metoprolol to 12.5 mg daily instead of 25 mg daily

## 2020-11-15 ENCOUNTER — Other Ambulatory Visit: Payer: Self-pay | Admitting: Family Medicine

## 2020-11-15 LAB — CK: Total CK: 194 U/L — ABNORMAL HIGH (ref 29–143)

## 2020-11-15 LAB — TSH: TSH: 3.83 mIU/L (ref 0.40–4.50)

## 2020-11-15 MED ORDER — LEVOTHYROXINE SODIUM 25 MCG PO TABS: 25.0000 ug | ORAL_TABLET | Freq: Every day | ORAL | 0 refills | Status: DC

## 2020-11-19 DIAGNOSIS — L03115 Cellulitis of right lower limb: Secondary | ICD-10-CM | POA: Diagnosis not present

## 2020-11-21 ENCOUNTER — Telehealth: Payer: Self-pay

## 2020-11-21 ENCOUNTER — Other Ambulatory Visit: Payer: Self-pay | Admitting: Family Medicine

## 2020-11-21 MED ORDER — BENZONATATE 100 MG PO CAPS: 100.0000 mg | ORAL_CAPSULE | Freq: Two times a day (BID) | ORAL | 0 refills | Status: DC | PRN

## 2020-11-21 NOTE — Telephone Encounter (Signed)
Pt stated that she mentioned to you at her last visit that she had a cough and nothing was called in. She still has it and would like something called in to [facility].

## 2020-11-22 DIAGNOSIS — Z792 Long term (current) use of antibiotics: Secondary | ICD-10-CM | POA: Diagnosis not present

## 2020-11-22 DIAGNOSIS — T8450XD Infection and inflammatory reaction due to unspecified internal joint prosthesis, subsequent encounter: Secondary | ICD-10-CM | POA: Diagnosis not present

## 2020-11-22 DIAGNOSIS — L089 Local infection of the skin and subcutaneous tissue, unspecified: Secondary | ICD-10-CM | POA: Diagnosis not present

## 2020-11-22 DIAGNOSIS — I890 Lymphedema, not elsewhere classified: Secondary | ICD-10-CM | POA: Diagnosis not present

## 2020-11-22 DIAGNOSIS — B951 Streptococcus, group B, as the cause of diseases classified elsewhere: Secondary | ICD-10-CM | POA: Diagnosis not present

## 2020-11-22 DIAGNOSIS — R7881 Bacteremia: Secondary | ICD-10-CM | POA: Diagnosis not present

## 2020-11-24 DIAGNOSIS — R2681 Unsteadiness on feet: Secondary | ICD-10-CM | POA: Diagnosis not present

## 2020-11-24 DIAGNOSIS — L03115 Cellulitis of right lower limb: Secondary | ICD-10-CM | POA: Diagnosis not present

## 2020-11-24 DIAGNOSIS — E1140 Type 2 diabetes mellitus with diabetic neuropathy, unspecified: Secondary | ICD-10-CM | POA: Diagnosis not present

## 2020-11-26 ENCOUNTER — Telehealth: Payer: Self-pay | Admitting: Family Medicine

## 2020-11-26 DIAGNOSIS — L03115 Cellulitis of right lower limb: Secondary | ICD-10-CM | POA: Diagnosis not present

## 2020-11-26 NOTE — Telephone Encounter (Signed)
Patient called to request an order for a Hov around.  Please advise and let patient know if this is possible.  CB# [phone]

## 2020-11-30 DIAGNOSIS — E1142 Type 2 diabetes mellitus with diabetic polyneuropathy: Secondary | ICD-10-CM | POA: Diagnosis not present

## 2020-11-30 DIAGNOSIS — B351 Tinea unguium: Secondary | ICD-10-CM | POA: Diagnosis not present

## 2020-12-03 DIAGNOSIS — G4733 Obstructive sleep apnea (adult) (pediatric): Secondary | ICD-10-CM | POA: Diagnosis not present

## 2020-12-13 ENCOUNTER — Other Ambulatory Visit: Payer: Self-pay | Admitting: Family Medicine

## 2020-12-13 DIAGNOSIS — R600 Localized edema: Secondary | ICD-10-CM

## 2020-12-13 DIAGNOSIS — I10 Essential (primary) hypertension: Secondary | ICD-10-CM

## 2020-12-20 DIAGNOSIS — L03115 Cellulitis of right lower limb: Secondary | ICD-10-CM | POA: Diagnosis not present

## 2020-12-25 DIAGNOSIS — E1140 Type 2 diabetes mellitus with diabetic neuropathy, unspecified: Secondary | ICD-10-CM | POA: Diagnosis not present

## 2020-12-25 DIAGNOSIS — R2681 Unsteadiness on feet: Secondary | ICD-10-CM | POA: Diagnosis not present

## 2020-12-25 DIAGNOSIS — L03115 Cellulitis of right lower limb: Secondary | ICD-10-CM | POA: Diagnosis not present

## 2020-12-26 NOTE — Progress Notes (Signed)
12/27/2020 10:44 AM   Becky Trevino 26-Mar-1946 [id number]  Referring provider: [name], MD [address] [place],  [place] [address]  Chief Complaint  Patient presents with  . Urinary Incontinence   Urological history: 1. Mixed incontinence -contributing factors of age, obesity, cystocele, diuretics, DM and DDD -managed with Myrbetriq 50 mg daily  2. Sleep apnea -Noncompliant with CPAP machine   HPI: Becky Trevino is a 75 y.o. female who presents today for follow up.   She is experiencing 1-7 daytime urinations, 1-2 nocturia, mild urgency, stress incontinence, leakage occurring 1-2 times weekly and she is wearing 1 absorbent depends daily.   BP 138/84.  PVR 32 mL   Patient denies any modifying or aggravating factors.  Patient denies any gross hematuria, dysuria or suprapubic/flank pain.  Patient denies any fevers, chills, nausea or vomiting.   She is asking to come off her Tofranil at this time.    PMH: Past Medical History:  Diagnosis Date  . Cervical spondylosis   . Chronic kidney disease    Stage 3  . DDD (degenerative disc disease), cervical    [facility]  . Diabetes mellitus without complication ([place])   . Diverticulosis   . Hyperlipidemia   . Hypertension   . Intertrigo   . Leg cramps   . Leg varices   . Obesity   . OSA (obstructive sleep apnea)   . Symptomatic menopausal or female climacteric states   . Syncope     Surgical History: Past Surgical History:  Procedure Laterality Date  . ABDOMINAL HYSTERECTOMY  1993   Total  . BUNIONECTOMY Bilateral 1993  . I & D KNEE WITH POLY EXCHANGE Right 11/19/2017   Procedure: RIGHT KNEE POLY EXCHANGE WITH IRRIGATION AND DEBRIDEMENT;  Surgeon: Hessie Knows, MD;  Location: [facility];  Service: Orthopedics;  Laterality: Right;  . JOINT REPLACEMENT     bilateral knee  . LAMINECTOMY  11/14/2013    Cervical Fusion , [facility], Dr. Delilah Shan  . LASER ABLATION Bilateral 07/29/2012   Dr. Lucky Cowboy   . LOWER EXTREMITY ANGIOGRAPHY Right 11/02/2019   Procedure: Lower Extremity Angiography;  Surgeon: Katha Cabal, MD;  Location: [facility];  Service: Cardiovascular;  Laterality: Right;  . ORIF ANKLE FRACTURE Right 09/02/2019   Procedure: OPEN REDUCTION INTERNAL FIXATION (ORIF) ANKLE FRACTURE BIMALLEOLAR;  Surgeon: [name], DPM;  Location: [facility];  Service: Podiatry;  Laterality: Right;  . PERIPHERAL VASCULAR THROMBECTOMY Right 03/02/2020   Procedure: PERIPHERAL VASCULAR THROMBECTOMY;  Surgeon: Algernon Huxley, MD;  Location: [facility];  Service: Cardiovascular;  Laterality: Right;  . REPLACEMENT TOTAL KNEE Left 03/2010   Dr. Rudene Christians  . TOTAL HIP ARTHROPLASTY Left 12/11/2015   Procedure: TOTAL HIP ARTHROPLASTY ANTERIOR APPROACH;  Surgeon: Hessie Knows, MD;  Location: [facility];  Service: Orthopedics;  Laterality: Left;  . TOTAL KNEE ARTHROPLASTY Right 03/11/2016   Procedure: TOTAL KNEE ARTHROPLASTY;  Surgeon: Hessie Knows, MD;  Location: [facility];  Service: Orthopedics;  Laterality: Right;    Home Medications:  Allergies as of 12/27/2020      Reactions   Lisinopril Swelling      Medication List       Accurate as of December 27, 2020 10:44 AM. If you have any questions, ask your nurse or doctor.        Accu-Chek Guide test strip Generic drug: glucose blood CHECK FASTING BLOOD SUGAR ONCE DAILY AND UP TO 4 TIMES DAILY AS DIRECTED  acetaminophen 650 MG CR tablet Commonly known as: TYLENOL Take 650-1,300 mg by mouth 2 (two) times daily as needed for pain.   Aspirin Low Dose 81 MG EC tablet Generic drug: aspirin TAKE 1 TABLET BY MOUTH EVERY DAY   atorvastatin 40 MG tablet Commonly known as: LIPITOR Take 1 tablet (40 mg total) by mouth every evening.   benzonatate 100 MG capsule Commonly known as: TESSALON TAKE 1 CAPSULE BY MOUTH 2 TIMES DAILY AS NEEDED FOR COUGH.   blood glucose meter kit and supplies Dispense based on patient and insurance preference.  Use up to four times daily as directed. (FOR ICD-10 E10.9, E11.9).   Calcium Carbonate-Vitamin D 600-400 MG-UNIT tablet Take 1 tablet by mouth 2 (two) times daily.   doxycycline 100 MG tablet Commonly known as: VIBRA-TABS Take 1 tablet by mouth in the morning and at bedtime.   fluticasone 50 MCG/ACT nasal spray Commonly known as: FLONASE Place 2 sprays into both nostrils daily.   imipramine 10 MG tablet Commonly known as: Tofranil Take 1 tablet (10 mg total) by mouth at bedtime. What changed:   medication strength  See the new instructions. Changed by: Zara Council, PA-C   ketoconazole 2 % cream Commonly known as: NIZORAL Apply 1 application topically 2 (two) times daily as needed (rash).   latanoprost 0.005 % ophthalmic solution Commonly known as: XALATAN SMARTSIG:In Eye(s)   levothyroxine 25 MCG tablet Commonly known as: SYNTHROID Take 1 tablet (25 mcg total) by mouth daily at 6 (six) AM.   Magnesium Oxide 500 MG Caps Take 500 mg by mouth daily.   metoprolol succinate 25 MG 24 hr tablet Commonly known as: TOPROL-XL Take 0.5 tablets (12.5 mg total) by mouth daily.   mirabegron ER 50 MG Tb24 tablet Commonly known as: Myrbetriq Take 1 tablet (50 mg total) by mouth daily.   polyethylene glycol 17 g packet Commonly known as: MIRALAX / GLYCOLAX Take 17 g by mouth daily.   potassium chloride 10 MEQ tablet Commonly known as: KLOR-CON Take 10 mEq by mouth daily.   Simbrinza 1-0.2 % Susp Generic drug: Brinzolamide-Brimonidine Place 1 drop into both eyes 2 (two) times daily.   torsemide 20 MG tablet Commonly known as: DEMADEX Take 1 tablet (20 mg total) by mouth daily.       Allergies:  Allergies  Allergen Reactions  . Lisinopril Swelling    Family History: Family History  Problem Relation Age of Onset  . Diabetes Mother   . Hyperlipidemia Mother   . Hypertension Mother   . Diabetes Father   . Hyperlipidemia Father   . Hypertension Father   .  Obesity Father   . Hypertension Sister   . Hyperlipidemia Sister   . Hyperlipidemia Sister   . Breast cancer Neg Hx     Social History:  reports that she has never smoked. She has never used smokeless tobacco. She reports that she does not drink alcohol and does not use drugs.  ROS: For pertinent review of systems please refer to history of present illness  Physical Exam: BP 138/84   Pulse 76   Ht 5' 11"  (1.803 m)   BMI 44.49 kg/m   Constitutional:  Well nourished. Alert and oriented, No acute distress. HEENT: [place] AT, mask in place.  Trachea midline Cardiovascular: No clubbing, cyanosis, or edema. Respiratory: Normal respiratory effort, no increased work of breathing. Neurologic: Grossly intact, no focal deficits, moving all 4 extremities.  In wheelchair Psychiatric: Normal mood and affect.   Laboratory  Data: Lab Results  Component Value Date   WBC 5.2 09/28/2020   HGB 12.0 09/28/2020   HCT 36.8 09/28/2020   MCV 93.6 09/28/2020   PLT 196 09/28/2020    Lab Results  Component Value Date   CREATININE 1.28 (H) 09/28/2020    Lab Results  Component Value Date   HGBA1C 6.1 (A) 09/28/2020    Lab Results  Component Value Date   TSH 3.83 11/14/2020    Lab Results  Component Value Date   AST 27 09/04/2020   Lab Results  Component Value Date   ALT 18 09/04/2020   I have reviewed the labs.  Pertinent Imaging  Results for AKSHITHA, CULMER (MRN [id number]) as of 12/27/2020 10:41  Ref. Range 12/27/2020 10:24  Scan Result Unknown 32    Assessment & Plan:    1. Mixed incontinence -At goal with Myrbetriq to 50 mg daily, but has been recommended that she discontinue her Tofranil which I am in agreement with -We will need to taper off this medication, so she will start Tofranil 10 mg daily for 2 weeks and then she will take Tofranil 10 mg every other day for 2 weeks -She will advise if she starts to experience any withdrawal symptoms -She also be given a specimen cup so  that she can bring a UA and for her next visit, as she had an UTI associated with microhematuria and I would like to make sure this resolved.  2. Nocturia Advised patient to restart her CPAP usage  Return in about 6 months (around 06/28/2021) for PVR and OAB questionnaire.  These notes generated with voice recognition software. I apologize for typographical errors.  Zara Council, PA-C  [facility] [address] [place], [place] [address] [phone]

## 2020-12-27 ENCOUNTER — Encounter: Payer: Self-pay | Admitting: Urology

## 2020-12-27 ENCOUNTER — Ambulatory Visit: Payer: Medicare Other | Admitting: Urology

## 2020-12-27 ENCOUNTER — Other Ambulatory Visit: Payer: Self-pay

## 2020-12-27 VITALS — BP 138/84 | HR 76 | Ht 71.0 in

## 2020-12-27 DIAGNOSIS — L03115 Cellulitis of right lower limb: Secondary | ICD-10-CM | POA: Diagnosis not present

## 2020-12-27 DIAGNOSIS — N3946 Mixed incontinence: Secondary | ICD-10-CM

## 2020-12-27 DIAGNOSIS — R351 Nocturia: Secondary | ICD-10-CM | POA: Diagnosis not present

## 2020-12-27 LAB — BLADDER SCAN AMB NON-IMAGING: Scan Result: 32

## 2020-12-27 MED ORDER — IMIPRAMINE HCL 10 MG PO TABS: 10.0000 mg | ORAL_TABLET | Freq: Every day | ORAL | 0 refills | Status: DC

## 2020-12-27 NOTE — Addendum Note (Signed)
Addended by: Verlene Mayer A on: 12/27/2020 03:31 PM   Modules accepted: Orders

## 2020-12-31 NOTE — Progress Notes (Addendum)
Name: Becky Trevino   MRN: [id number]    DOB: 07-Feb-1946   Date:01/01/2021       Progress Note  Subjective  Chief Complaint  Mobility Evaluation  HPI  Patient is here today to discussed Power Mobility Device eligibility      Hypotension:bp has dropped and she is on Midodrine . She is down to 25  mg of Metoprolol and two pills of midodrine, advised to go down to 12.5 mg of metoprolol and try to come off midodrine by next visit   Left femoral nerve palsy:  She was diagnosed with a hematoma of left iliopsoas during hospital stay 10/2019 , but she had repeat CT pelvis 01/09/2020 that showed resolution of hematoma. After that she developed difficulty ambulating, initially thought to be temporary, but continues to be dependent on assistance to push her wheelchair. She has palsy of left leg and left foot drop, she can only walk short distance - from her bed to the bathroom while using walker. She can get up on her own from her bed but difficulty when sitting on lower chairs or wheelchair. While sitting her trunk is always leaning to the right since left knee is always on leaning medially and left hip cannot be even with right due to palsy. She saw neurosurgeon for possible release of femoral nerve but since procedure would be too risk without guaranteed of improvement it was decided not to be done. She has weakness and paresthesias of left thigh and lower leg. She had bilateral knee replacement and still takes antibiotics for infection of right knee.   The mobility device would help patient leave her house to go groceries shopping, also assist her in cooking and preparing meals. She would also be able to dust and keep her house organized It would be used inside and outside her home Manual wheelchair is not feasible , she cannot self propel and since a permanent damage she needs something to keep her functional, she cannot walk more than a few steps with a walker ( only inside the house) Scooter  is not an option due to her trunk instability, always leaning to the right due to left hip being shifted due to nerve palsy  She would be able to operate a power mobility device mentally and physically I expect the amount of time taken to transfer to be less than 5 minutes  Summary study done [facility] 10/2020  Nerve Conduction Studies: This study was performed at a skin temperature of 34 C. 1. The left sural sensory response, right superficial peroneal sensory response, and bilateral tibial motor responses were absent. 2. The left peroneal to EDB motor response demonstrated normal distal latency and reduced amplitude at the ankle stimulation site. No response was elicited with stimulation at the fibular head or knee, likely secondary to body habitus/edema. 3. The left peroneal to tibialis anterior motor response demonstrated normal distal latency and reduced amplitude at the knee stimulation site; no response was elicited with stimulation at the fibular head.   EMG: Concentric needle examination was performed in selected muscles in the left lower extremity. Abnormal spontaneous activity in the form of fibrillations and positive sharp waves were observed in the left vastus medialis, vastus lateralis, rectus femoris, with no voluntary motor activity observed despite good effort on the part of the patient. Needle electrode examination of the left tibialis anterior was normal. An obturator innervated muscle was not examined due to body habitus.  Conclusion: This is an abnormal, technically complicated study  due to the presence of significant lower extremity edema and morbid obesity. There is electrodiagnostic evidence most consistent with a severe left femoral neuropathy, with active denervation and no evidence of reinnervation in the vastus medialis, vastus lateralis, or rectus femoris. The abnormal sensory and motor responses in the lower extremities may attribute to an underlying sensorimotor  neuropathy, or may be due to to technical factors related to edema/body habitus.    Patient Active Problem List   Diagnosis Date Noted  . Anasarca   . Weakness   . Hypothyroidism   . SVT (supraventricular tachycardia) ([place])   . Sepsis due to Streptococcus agalactiae ([place])   . Acute kidney injury superimposed on CKD ([place])   . Hypotension   . Class 2 obesity without serious comorbidity with body mass index (BMI) of 39.0 to 39.9 in adult   . Sepsis ([place]) 09/03/2020  . Sepsis due to undetermined organism ([place]) 09/02/2020  . Thrombocytopenia ([place])   . Mild protein-calorie malnutrition ([place])   . Hyperbilirubinemia   . Injury of left femoral nerve 08/28/2020  . History of DVT (deep vein thrombosis) 06/25/2020  . Lymphedema 06/25/2020  . History of pelvic hematoma 02/10/2020  . Foot ulcer, right, limited to breakdown of skin ([place]) 12/02/2019  . MRSA bacteremia 10/30/2019  . Chronic venous hypertension due to deep vein thrombosis (DVT) 10/30/2019  . Hematoma   . Normocytic anemia 08/07/2019  . Chronic infection of right knee ([place]) 11/25/2018  . Infection and inflammatory reaction due to internal joint prosthesis, subsequent encounter 01/25/2018  . Infection of total right knee replacement ([place]) 11/19/2017  . Type 2 diabetes mellitus with hyperlipidemia ([place]) 11/06/2016  . Primary osteoarthritis of knee 03/11/2016  . Primary osteoarthritis of left hip 12/11/2015  . Benign essential HTN 04/21/2015  . Edema leg 04/21/2015  . Chronic kidney disease (CKD), stage III (moderate) ([place]) 04/21/2015  . Chronic venous insufficiency 04/21/2015  . DDD (degenerative disc disease), cervical 04/21/2015  . Diabetes mellitus with renal manifestation ([place]) 04/21/2015  . Hyperlipidemia 04/21/2015  . Bladder cystocele 04/21/2015  . Urinary incontinence 04/21/2015  . Leg varices 04/21/2015  . Insomnia 04/21/2015  . Eczema intertrigo 04/21/2015  . Obesity, Class III, BMI 40-49.9 (morbid obesity) ([place])  04/21/2015  . OSA (obstructive sleep apnea) 04/21/2015  . Menopausal and perimenopausal disorder 04/21/2015  . Supraventricular premature beats 04/21/2015  . Osteoarthritis of multiple joints 04/21/2015  . H/O Spinal surgery 11/14/2013  . Detrusor muscle hypertonia 11/07/2013    Past Surgical History:  Procedure Laterality Date  . ABDOMINAL HYSTERECTOMY  1993   Total  . BUNIONECTOMY Bilateral 1993  . I & D KNEE WITH POLY EXCHANGE Right 11/19/2017   Procedure: RIGHT KNEE POLY EXCHANGE WITH IRRIGATION AND DEBRIDEMENT;  Surgeon: Hessie Knows, MD;  Location: [facility];  Service: Orthopedics;  Laterality: Right;  . JOINT REPLACEMENT     bilateral knee  . LAMINECTOMY  11/14/2013    Cervical Fusion , [facility], Dr. Delilah Shan  . LASER ABLATION Bilateral 07/29/2012   Dr. Lucky Cowboy  . LOWER EXTREMITY ANGIOGRAPHY Right 11/02/2019   Procedure: Lower Extremity Angiography;  Surgeon: Katha Cabal, MD;  Location: [place] [facility];  Service: Cardiovascular;  Laterality: Right;  . ORIF ANKLE FRACTURE Right 09/02/2019   Procedure: OPEN REDUCTION INTERNAL FIXATION (ORIF) ANKLE FRACTURE BIMALLEOLAR;  Surgeon: [name], DPM;  Location: [facility];  Service: Podiatry;  Laterality: Right;  . PERIPHERAL VASCULAR THROMBECTOMY Right 03/02/2020   Procedure: PERIPHERAL VASCULAR THROMBECTOMY;  Surgeon: Algernon Huxley,  MD;  Location: [place] [facility];  Service: Cardiovascular;  Laterality: Right;  . REPLACEMENT TOTAL KNEE Left 03/2010   Dr. Rudene Christians  . TOTAL HIP ARTHROPLASTY Left 12/11/2015   Procedure: TOTAL HIP ARTHROPLASTY ANTERIOR APPROACH;  Surgeon: Hessie Knows, MD;  Location: [facility];  Service: Orthopedics;  Laterality: Left;  . TOTAL KNEE ARTHROPLASTY Right 03/11/2016   Procedure: TOTAL KNEE ARTHROPLASTY;  Surgeon: Hessie Knows, MD;  Location: [facility];  Service: Orthopedics;  Laterality: Right;    Family History  Problem Relation Age of Onset  . Diabetes Mother   . Hyperlipidemia Mother   . Hypertension  Mother   . Diabetes Father   . Hyperlipidemia Father   . Hypertension Father   . Obesity Father   . Hypertension Sister   . Hyperlipidemia Sister   . Hyperlipidemia Sister   . Breast cancer Neg Hx     Social History   Tobacco Use  . Smoking status: Never Smoker  . Smokeless tobacco: Never Used  Substance Use Topics  . Alcohol use: No    Alcohol/week: 0.0 standard drinks     Current Outpatient Medications:  .  acetaminophen (TYLENOL) 650 MG CR tablet, Take 650-1,300 mg by mouth 2 (two) times daily as needed for pain., Disp: , Rfl:  .  ASPIRIN LOW DOSE 81 MG EC tablet, TAKE 1 TABLET BY MOUTH EVERY DAY, Disp: 90 tablet, Rfl: 0 .  atorvastatin (LIPITOR) 40 MG tablet, Take 1 tablet (40 mg total) by mouth every evening., Disp: 90 tablet, Rfl: 1 .  benzonatate (TESSALON) 100 MG capsule, TAKE 1 CAPSULE BY MOUTH 2 TIMES DAILY AS NEEDED FOR COUGH., Disp: 60 capsule, Rfl: 0 .  Calcium Carbonate-Vitamin D 600-400 MG-UNIT tablet, Take 1 tablet by mouth 2 (two) times daily., Disp: 60 tablet, Rfl: 0 .  doxycycline (VIBRA-TABS) 100 MG tablet, Take 1 tablet by mouth in the morning and at bedtime., Disp: , Rfl:  .  fluticasone (FLONASE) 50 MCG/ACT nasal spray, Place 2 sprays into both nostrils daily., Disp: 16 g, Rfl: 2 .  imipramine (TOFRANIL) 10 MG tablet, Take 1 tablet (10 mg total) by mouth at bedtime., Disp: 30 tablet, Rfl: 0 .  ketoconazole (NIZORAL) 2 % cream, Apply 1 application topically 2 (two) times daily as needed (rash). , Disp: , Rfl:  .  latanoprost (XALATAN) 0.005 % ophthalmic solution, SMARTSIG:In Eye(s), Disp: , Rfl:  .  Magnesium Oxide 500 MG CAPS, Take 500 mg by mouth daily. , Disp: , Rfl:  .  metoprolol succinate (TOPROL-XL) 25 MG 24 hr tablet, Take 0.5 tablets (12.5 mg total) by mouth daily., Disp: 90 tablet, Rfl: 0 .  mirabegron ER (MYRBETRIQ) 50 MG TB24 tablet, Take 1 tablet (50 mg total) by mouth daily., Disp: 90 tablet, Rfl: 3 .  polyethylene glycol (MIRALAX / GLYCOLAX) 17  g packet, Take 17 g by mouth daily., Disp: 14 each, Rfl: 0 .  potassium chloride (KLOR-CON) 10 MEQ tablet, Take 10 mEq by mouth daily., Disp: , Rfl:  .  SIMBRINZA 1-0.2 % SUSP, Place 1 drop into both eyes 2 (two) times daily., Disp: , Rfl:  .  levothyroxine (SYNTHROID) 25 MCG tablet, Take 1 tablet (25 mcg total) by mouth daily at 6 (six) AM., Disp: 90 tablet, Rfl: 0 .  torsemide (DEMADEX) 20 MG tablet, Take 1 tablet (20 mg total) by mouth daily., Disp: 90 tablet, Rfl: 0  Allergies  Allergen Reactions  . Lisinopril Swelling    I personally reviewed active problem list,  medication list, allergies, family history, social history with the patient/caregiver today.   ROS  Ten systems reviewed and is negative except as mentioned in HPI   Objective  Vitals:   01/01/21 1046  BP: 124/80  Pulse: 73  Resp: 18  Temp: 97.6 F (36.4 C)  TempSrc: Oral  SpO2: 99%  Weight: (!) 319 lb (144.7 kg)  Height: 5\' 11"  (1.803 m)    Body mass index is 44.49 kg/m.  Physical Exam  Constitutional: Patient appears well-developed and well-nourished. Obese  No distress.  HEENT: head atraumatic, normocephalic, pupils equal and reactive to light,  neck supple Cardiovascular: Normal rate, regular rhythm and normal heart sounds.  No murmur heard.  BLE non pitting  edema. Pulmonary/Chest: Effort normal course breath sounds on both bases  No respiratory distress. Abdominal: Soft.  There is no tenderness. Muscular skeletal: sitting on wheelchair, leaning to the left, a PT belt on her wait, knees with well healed scars, dry skin of right knee and some pilling. Unable to lift left lower leg without assistance but able to keep leg extended with little support 4/5 strength , and also  to abduct and adduct hips,normal strength grip on upper extremities and right lower leg 5/5 strength of upper extremities  Psychiatric: Patient has a normal mood and affect. behavior is normal. Judgment and thought content  normal.  Recent Results (from the past 2160 hour(s))  CK (Creatine Kinase)     Status: Abnormal   Collection Time: 11/14/20 12:15 PM  Result Value Ref Range   Total CK 194 (H) 29 - 143 U/L  TSH     Status: None   Collection Time: 11/14/20 12:15 PM  Result Value Ref Range   TSH 3.83 0.40 - 4.50 mIU/L  Bladder Scan (Post Void Residual) in office     Status: None   Collection Time: 12/27/20 10:24 AM  Result Value Ref Range   Scan Result 32       PHQ2/9: Depression screen [facility] 2/9 01/01/2021 11/14/2020 09/28/2020 08/28/2020 07/31/2020  Decreased Interest 0 0 0 0 0  Down, Depressed, Hopeless 0 0 0 0 0  PHQ - 2 Score 0 0 0 0 0  Altered sleeping - - 0 - -  Tired, decreased energy - - 0 - -  Change in appetite - - 0 - -  Feeling bad or failure about yourself  - - 0 - -  Trouble concentrating - - 0 - -  Moving slowly or fidgety/restless - - 0 - -  Suicidal thoughts - - 0 - -  PHQ-9 Score - - 0 - -  Difficult doing work/chores - - Not difficult at all - -  Some recent data might be hidden    phq 9 is negative   Fall Risk: Fall Risk  01/01/2021 11/14/2020 09/28/2020 08/28/2020 07/31/2020  Falls in the past year? 0 1 1 0 0  Number falls in past yr: 0 1 1 0 0  Comment - - - - -  Injury with Fall? 0 0 0 0 0  Comment - - - - -  Risk for fall due to : - - - - Impaired mobility;Impaired balance/gait  Follow up - - - - Falls prevention discussed     Assessment & Plan  1. Gait difficulty  Patient is a candidate for PMD due to permanent damage of left femoral nerve affecting her ability to walk more than a few steps with a walker, sways to right side when sitting  and has permanent left lower leg weakness and foot drop   2. History of bilateral knee replacement   3. Injury of left femoral nerve, sequela

## 2021-01-01 ENCOUNTER — Ambulatory Visit (INDEPENDENT_AMBULATORY_CARE_PROVIDER_SITE_OTHER): Payer: Medicare Other | Admitting: Family Medicine

## 2021-01-01 ENCOUNTER — Encounter: Payer: Self-pay | Admitting: Family Medicine

## 2021-01-01 ENCOUNTER — Other Ambulatory Visit: Payer: Self-pay

## 2021-01-01 VITALS — BP 124/80 | HR 73 | Temp 97.6°F | Resp 18 | Ht 71.0 in | Wt 319.0 lb

## 2021-01-01 DIAGNOSIS — R269 Unspecified abnormalities of gait and mobility: Secondary | ICD-10-CM | POA: Diagnosis not present

## 2021-01-01 DIAGNOSIS — S7412XS Injury of femoral nerve at hip and thigh level, left leg, sequela: Secondary | ICD-10-CM | POA: Diagnosis not present

## 2021-01-01 DIAGNOSIS — Z96653 Presence of artificial knee joint, bilateral: Secondary | ICD-10-CM | POA: Diagnosis not present

## 2021-01-08 ENCOUNTER — Other Ambulatory Visit: Payer: Self-pay | Admitting: Family Medicine

## 2021-01-08 DIAGNOSIS — J3089 Other allergic rhinitis: Secondary | ICD-10-CM

## 2021-01-17 ENCOUNTER — Telehealth: Payer: Self-pay

## 2021-01-17 NOTE — Telephone Encounter (Signed)
Pt states that [employer] has re-faxed the paperwork for Dr Ancil Boozer stated that it was 4 questions that needed to be answered. If we did not receive the paperwork will you call them and have them to refax it and then contact the patient to let her know everything has been taken care of.

## 2021-01-19 DIAGNOSIS — L03115 Cellulitis of right lower limb: Secondary | ICD-10-CM | POA: Diagnosis not present

## 2021-01-20 ENCOUNTER — Other Ambulatory Visit: Payer: Self-pay | Admitting: Family Medicine

## 2021-01-20 ENCOUNTER — Other Ambulatory Visit: Payer: Self-pay | Admitting: Urology

## 2021-01-20 DIAGNOSIS — J3089 Other allergic rhinitis: Secondary | ICD-10-CM

## 2021-01-22 NOTE — Telephone Encounter (Signed)
Pt called again checking status on this paperwork. Please contact pt once this has been taken care of.

## 2021-01-23 NOTE — Telephone Encounter (Signed)
Paperwork has been faxed and refaxed. [employer] contacted and confirmed receipt. Order is processing. Patient will be notified when completed.

## 2021-01-24 DIAGNOSIS — L03115 Cellulitis of right lower limb: Secondary | ICD-10-CM | POA: Diagnosis not present

## 2021-01-24 DIAGNOSIS — E1140 Type 2 diabetes mellitus with diabetic neuropathy, unspecified: Secondary | ICD-10-CM | POA: Diagnosis not present

## 2021-01-24 DIAGNOSIS — R2681 Unsteadiness on feet: Secondary | ICD-10-CM | POA: Diagnosis not present

## 2021-01-26 DIAGNOSIS — L03115 Cellulitis of right lower limb: Secondary | ICD-10-CM | POA: Diagnosis not present

## 2021-01-29 ENCOUNTER — Other Ambulatory Visit: Payer: Self-pay | Admitting: Family Medicine

## 2021-01-29 DIAGNOSIS — I10 Essential (primary) hypertension: Secondary | ICD-10-CM

## 2021-02-01 NOTE — Progress Notes (Signed)
Name: Becky Trevino   MRN: [id number]    DOB: [birth year]   Date:02/04/2021       Progress Note  Subjective  Chief Complaint  Follow Up  HPI  DMII with renal manifestation andobesity:she denies polyuria, polyphagia or polydipsia. A1C done 09/2020 was 6.1 % today is down to 5.3 %  last urine micro was normal, we will recheck it today   She has CKI , dyslipidemia and obesity   Obesity Morbid:she has been unable to be active, however she is trying to eat healthier , she wants to resume PT to help with physical activity and weight loss  Hypotension:bp has dropped and she is on Midodrine . She is now off midodrine and on half dose of metoprrolol, heart rate is at goal, she states she felt dizzy last night but did not check her bp. Advised to monitor for now but if bp drops and stays below 120/80's she can stop taking metoprolol   Chronic DVT  right popliteal vein: diagnosed 10/27/2019, she was on Xarelto and at one time Eliquis  but stopped secondary to iliopsoas hematoma, she still has some swelling of right thigh, she has an IV filter. She has been wearing compression stocking hoses and pump and seems to help with symptoms a little. She does not have pain from the DVT but has pain on right hip due to OA - pain only present when she is standing or walking She states feeling weak and would like to resume PT at home   Hyperlipidemia: taking Atorvastatin daily and denies side effects. No chest pain , no muscles pain.Recheck labs today   Insomnia/OSA:Last Echo done by Dr. Clayborn Bigness showed mild pulmonary hypertension. Reminded her to resume CPAP, her daughter will find a way to set it up near her hospital bed   OA: multi joints, sees Ortho, Dr. Rudene Christians . She had revision of right knee replacement March 2019 because of infection. Still under the care of [facility] also seeing Dr. Ola Spurr - ID. She is still on daily antibiotics , she is frustrated because she cannot have right hip replacement  now   Left femoral nerve palsy: seen by Dr. Rudene Christians, Dr. Manuella Ghazi, now [facility] , Dr. Tamala Julian. She is able to walk short distances but s wearing brace because left knee is unstable due to femoral nerve palsy. She still  needs assistance bathing, cannot drive, cannot transfer alone . She was diagnosed with a hematoma of left iliopsoas during hospital stay 10/2019 , but she had repeat CT pelvis 01/09/2020 that showed resolution of hematoma. She is now off Eliquis and on aspirin only due to history of hematoma Dr. Tamala Julian reviewed her case and advised against doing the surgery to decompress the nerve. Unchanged . She was approved for a motorized chair and should be getting one next week    Patient Active Problem List   Diagnosis Date Noted  . Anasarca   . Hypothyroidism   . SVT (supraventricular tachycardia) ([place])   . Acute kidney injury superimposed on CKD ([place])   . Hypotension   . Class 2 obesity without serious comorbidity with body mass index (BMI) of 39.0 to 39.9 in adult   . Thrombocytopenia ([place])   . Mild protein-calorie malnutrition ([place])   . Hyperbilirubinemia   . Injury of left femoral nerve 08/28/2020  . History of DVT (deep vein thrombosis) 06/25/2020  . Lymphedema 06/25/2020  . History of pelvic hematoma 02/10/2020  . Foot ulcer, right, limited to breakdown  of skin ([place]) 12/02/2019  . MRSA bacteremia 10/30/2019  . Chronic venous hypertension due to deep vein thrombosis (DVT) 10/30/2019  . Hematoma   . Normocytic anemia 08/07/2019  . Chronic infection of right knee ([place]) 11/25/2018  . Infection and inflammatory reaction due to internal joint prosthesis, subsequent encounter 01/25/2018  . Infection of total right knee replacement ([place]) 11/19/2017  . Type 2 diabetes mellitus with hyperlipidemia ([place]) 11/06/2016  . Primary osteoarthritis of knee 03/11/2016  . Primary osteoarthritis of left hip 12/11/2015  . Benign essential HTN 04/21/2015  . Edema leg 04/21/2015  .  Chronic kidney disease (CKD), stage III (moderate) ([place]) 04/21/2015  . Chronic venous insufficiency 04/21/2015  . DDD (degenerative disc disease), cervical 04/21/2015  . Diabetes mellitus with renal manifestation ([place]) 04/21/2015  . Hyperlipidemia 04/21/2015  . Bladder cystocele 04/21/2015  . Urinary incontinence 04/21/2015  . Leg varices 04/21/2015  . Insomnia 04/21/2015  . Eczema intertrigo 04/21/2015  . Obesity, Class III, BMI 40-49.9 (morbid obesity) ([place]) 04/21/2015  . OSA (obstructive sleep apnea) 04/21/2015  . Menopausal and perimenopausal disorder 04/21/2015  . Supraventricular premature beats 04/21/2015  . Osteoarthritis of multiple joints 04/21/2015  . H/O Spinal surgery 11/14/2013  . Detrusor muscle hypertonia 11/07/2013    Past Surgical History:  Procedure Laterality Date  . ABDOMINAL HYSTERECTOMY  1993   Total  . BUNIONECTOMY Bilateral 1993  . I & D KNEE WITH POLY EXCHANGE Right 11/19/2017   Procedure: RIGHT KNEE POLY EXCHANGE WITH IRRIGATION AND DEBRIDEMENT;  Surgeon: Hessie Knows, MD;  Location: [facility] OR[facility];  Service: Orthopedics;  Laterality: Right;  . JOINT REPLACEMENT     bilateral knee  . LAMINECTOMY  11/14/2013    Cervical Fusion , [facility], Dr. Delilah Shan  . LASER ABLATION Bilateral 07/29/2012   Dr. Lucky Cowboy  . LOWER EXTREMITY ANGIOGRAPHY Right 11/02/2019   Procedure: Lower Extremity Angiography;  Surgeon: Katha Cabal, MD;  Location: [place] [facility];  Service: Cardiovascular;  Laterality: Right;  . ORIF ANKLE FRACTURE Right 09/02/2019   Procedure: OPEN REDUCTION INTERNAL FIXATION (ORIF) ANKLE FRACTURE BIMALLEOLAR;  Surgeon: [name], DPM;  Location: [facility];  Service: Podiatry;  Laterality: Right;  . PERIPHERAL VASCULAR THROMBECTOMY Right 03/02/2020   Procedure: PERIPHERAL VASCULAR THROMBECTOMY;  Surgeon: Algernon Huxley, MD;  Location: [place] [facility];  Service: Cardiovascular;  Laterality: Right;  . REPLACEMENT TOTAL KNEE Left 03/2010   Dr. Rudene Christians  . TOTAL  HIP ARTHROPLASTY Left 12/11/2015   Procedure: TOTAL HIP ARTHROPLASTY ANTERIOR APPROACH;  Surgeon: Hessie Knows, MD;  Location: [facility];  Service: Orthopedics;  Laterality: Left;  . TOTAL KNEE ARTHROPLASTY Right 03/11/2016   Procedure: TOTAL KNEE ARTHROPLASTY;  Surgeon: Hessie Knows, MD;  Location: [facility];  Service: Orthopedics;  Laterality: Right;    Family History  Problem Relation Age of Onset  . Diabetes Mother   . Hyperlipidemia Mother   . Hypertension Mother   . Diabetes Father   . Hyperlipidemia Father   . Hypertension Father   . Obesity Father   . Hypertension Sister   . Hyperlipidemia Sister   . Hyperlipidemia Sister   . Breast cancer Neg Hx     Social History   Tobacco Use  . Smoking status: Never Smoker  . Smokeless tobacco: Never Used  Substance Use Topics  . Alcohol use: No    Alcohol/week: 0.0 standard drinks     Current Outpatient Medications:  .  acetaminophen (TYLENOL) 650 MG CR tablet, Take 650-1,300 mg by mouth 2 (two)  times daily as needed for pain., Disp: , Rfl:  .  ASPIRIN LOW DOSE 81 MG EC tablet, TAKE 1 TABLET BY MOUTH EVERY DAY, Disp: 90 tablet, Rfl: 0 .  atorvastatin (LIPITOR) 40 MG tablet, Take 1 tablet (40 mg total) by mouth every evening., Disp: 90 tablet, Rfl: 1 .  Calcium Carbonate-Vitamin D 600-400 MG-UNIT tablet, Take 1 tablet by mouth 2 (two) times daily., Disp: 60 tablet, Rfl: 0 .  doxycycline (VIBRA-TABS) 100 MG tablet, Take 1 tablet by mouth in the morning and at bedtime., Disp: , Rfl:  .  ketoconazole (NIZORAL) 2 % cream, Apply 1 application topically 2 (two) times daily as needed (rash). , Disp: , Rfl:  .  latanoprost (XALATAN) 0.005 % ophthalmic solution, SMARTSIG:In Eye(s), Disp: , Rfl:  .  levothyroxine (SYNTHROID) 25 MCG tablet, TAKE 1 TABLET (25 MCG TOTAL) BY MOUTH DAILY AT 6 (SIX) AM., Disp: 90 tablet, Rfl: 0 .  Magnesium Oxide 500 MG CAPS, Take 500 mg by mouth daily. , Disp: , Rfl:  .  mirabegron ER (MYRBETRIQ) 50 MG TB24 tablet,  Take 1 tablet (50 mg total) by mouth daily., Disp: 90 tablet, Rfl: 3 .  polyethylene glycol (MIRALAX / GLYCOLAX) 17 g packet, Take 17 g by mouth daily., Disp: 14 each, Rfl: 0 .  potassium chloride (KLOR-CON) 10 MEQ tablet, Take 10 mEq by mouth daily., Disp: , Rfl:  .  SIMBRINZA 1-0.2 % SUSP, Place 1 drop into both eyes 2 (two) times daily., Disp: , Rfl:  .  fluticasone (FLONASE) 50 MCG/ACT nasal spray, Place 2 sprays into both nostrils daily., Disp: 48 mL, Rfl: 0 .  metoprolol succinate (TOPROL-XL) 25 MG 24 hr tablet, Take 0.5 tablets (12.5 mg total) by mouth daily., Disp: 45 tablet, Rfl: 0 .  torsemide (DEMADEX) 20 MG tablet, Take 1 tablet (20 mg total) by mouth daily., Disp: 90 tablet, Rfl: 1  Allergies  Allergen Reactions  . Lisinopril Swelling    I personally reviewed active problem list, medication list, allergies, family history, social history, health maintenance with the patient/caregiver today.   ROS  Constitutional: Negative for fever or weight change.  Respiratory: Negative for cough and shortness of breath.   Cardiovascular: Negative for chest pain or palpitations.  Gastrointestinal: Negative for abdominal pain, no bowel changes.  Musculoskeletal: positive  for gait problem and  joint swelling.  Skin: Negative for rash.  Neurological: Negative for dizziness or headache.  No other specific complaints in a complete review of systems (except as listed in HPI above).  Objective  Vitals:   02/04/21 1047  BP: 122/80  Pulse: 76  Resp: 16  Temp: 97.8 F (36.6 C)  TempSrc: Oral  SpO2: 97%  Weight: (!) 320 lb (145.2 kg)  Height: 5\' 11"  (1.803 m)    Body mass index is 44.63 kg/m.  Physical Exam  Constitutional: Patient appears well-developed and well-nourished. Obese  No distress.  HEENT: head atraumatic, normocephalic, pupils equal and reactive to light,neck supple Cardiovascular: Normal rate, regular rhythm and normal heart sounds.  No murmur heard. No BLE  edema. Pulmonary/Chest: Effort normal and breath sounds normal. No respiratory distress. Abdominal: Soft.  There is no tenderness. Muscular Skeletal: sitting on wheelchair , leaning to the right side.  Psychiatric: Patient has a normal mood and affect. behavior is normal. Judgment and thought content normal.  Recent Results (from the past 2160 hour(s))  CK (Creatine Kinase)     Status: Abnormal   Collection Time: 11/14/20 12:15 PM  Result  Value Ref Range   Total CK 194 (H) 29 - 143 U/L  TSH     Status: None   Collection Time: 11/14/20 12:15 PM  Result Value Ref Range   TSH 3.83 0.40 - 4.50 mIU/L  Bladder Scan (Post Void Residual) in office     Status: None   Collection Time: 12/27/20 10:24 AM  Result Value Ref Range   Scan Result 32   POCT HgB A1C     Status: None   Collection Time: 02/04/21 10:52 AM  Result Value Ref Range   Hemoglobin A1C 5.3 4.0 - 5.6 %   HbA1c POC (<> result, manual entry)     HbA1c, POC (prediabetic range)     HbA1c, POC (controlled diabetic range)        PHQ2/9: Depression screen [facility] 2/9 02/04/2021 01/01/2021 11/14/2020 09/28/2020 08/28/2020  Decreased Interest 0 0 0 0 0  Down, Depressed, Hopeless 0 0 0 0 0  PHQ - 2 Score 0 0 0 0 0  Altered sleeping - - - 0 -  Tired, decreased energy - - - 0 -  Change in appetite - - - 0 -  Feeling bad or failure about yourself  - - - 0 -  Trouble concentrating - - - 0 -  Moving slowly or fidgety/restless - - - 0 -  Suicidal thoughts - - - 0 -  PHQ-9 Score - - - 0 -  Difficult doing work/chores - - - Not difficult at all -  Some recent data might be hidden    phq 9 is negative   Fall Risk: Fall Risk  02/04/2021 01/01/2021 11/14/2020 09/28/2020 08/28/2020  Falls in the past year? 0 0 1 1 0  Number falls in past yr: 0 0 1 1 0  Comment - - - - -  Injury with Fall? 0 0 0 0 0  Comment - - - - -  Risk for fall due to : - - - - -  Follow up - - - - -     Functional Status Survey: Is the patient deaf or have  difficulty hearing?: No Does the patient have difficulty seeing, even when wearing glasses/contacts?: No Does the patient have difficulty concentrating, remembering, or making decisions?: No Does the patient have difficulty walking or climbing stairs?: Yes Does the patient have difficulty dressing or bathing?: Yes Does the patient have difficulty doing errands alone such as visiting a doctor's office or shopping?: Yes    Assessment & Plan  1. Type 2 diabetes mellitus with diabetic neuropathy, without long-term current use of insulin (HCC)  - POCT HgB A1C  2. Stage 3 chronic kidney disease, unspecified whether stage 3a or 3b CKD (HCC)  - Microalbumin / creatinine urine ratio - COMPLETE METABOLIC PANEL WITH GFR  3. Benign essential HTN  - metoprolol succinate (TOPROL-XL) 25 MG 24 hr tablet; Take 0.5 tablets (12.5 mg total) by mouth daily.  Dispense: 45 tablet; Refill: 0 - COMPLETE METABOLIC PANEL WITH GFR - CBC with Differential/Platelet  4. Lower extremity edema  - torsemide (DEMADEX) 20 MG tablet; Take 1 tablet (20 mg total) by mouth daily.  Dispense: 90 tablet; Refill: 1  5. Perennial allergic rhinitis  - fluticasone (FLONASE) 50 MCG/ACT nasal spray; Place 2 sprays into both nostrils daily.  Dispense: 48 mL; Refill: 0  6. Pulmonary hypertension ([place])  Needs to resume CPAP machine   7. B12 deficiency  - Vitamin B12  8. Lower leg DVT (deep venous thromboembolism),  chronic, right ([place])   9. Gait difficulty  - Ambulatory referral to Home Health  10. Morbid obesity ([place])  Discussed with the patient the risk posed by an increased BMI. Discussed importance of portion control, calorie counting and at least 150 minutes of physical activity weekly. Avoid sweet beverages and drink more water. Eat at least 6 servings of fruit and vegetables daily   11. Dyslipidemia associated with type 2 diabetes mellitus (HCC)  - Lipid panel

## 2021-02-04 ENCOUNTER — Encounter: Payer: Self-pay | Admitting: Family Medicine

## 2021-02-04 ENCOUNTER — Other Ambulatory Visit: Payer: Self-pay

## 2021-02-04 ENCOUNTER — Ambulatory Visit (INDEPENDENT_AMBULATORY_CARE_PROVIDER_SITE_OTHER): Payer: Medicare Other | Admitting: Family Medicine

## 2021-02-04 VITALS — BP 122/80 | HR 76 | Temp 97.8°F | Resp 16 | Ht 71.0 in | Wt 320.0 lb

## 2021-02-04 DIAGNOSIS — E1140 Type 2 diabetes mellitus with diabetic neuropathy, unspecified: Secondary | ICD-10-CM | POA: Diagnosis not present

## 2021-02-04 DIAGNOSIS — I10 Essential (primary) hypertension: Secondary | ICD-10-CM | POA: Diagnosis not present

## 2021-02-04 DIAGNOSIS — R600 Localized edema: Secondary | ICD-10-CM | POA: Diagnosis not present

## 2021-02-04 DIAGNOSIS — N183 Chronic kidney disease, stage 3 unspecified: Secondary | ICD-10-CM | POA: Diagnosis not present

## 2021-02-04 DIAGNOSIS — R809 Proteinuria, unspecified: Secondary | ICD-10-CM | POA: Diagnosis not present

## 2021-02-04 DIAGNOSIS — I272 Pulmonary hypertension, unspecified: Secondary | ICD-10-CM | POA: Diagnosis not present

## 2021-02-04 DIAGNOSIS — R269 Unspecified abnormalities of gait and mobility: Secondary | ICD-10-CM

## 2021-02-04 DIAGNOSIS — I825Z1 Chronic embolism and thrombosis of unspecified deep veins of right distal lower extremity: Secondary | ICD-10-CM | POA: Diagnosis not present

## 2021-02-04 DIAGNOSIS — J3089 Other allergic rhinitis: Secondary | ICD-10-CM

## 2021-02-04 DIAGNOSIS — E785 Hyperlipidemia, unspecified: Secondary | ICD-10-CM | POA: Diagnosis not present

## 2021-02-04 DIAGNOSIS — E1169 Type 2 diabetes mellitus with other specified complication: Secondary | ICD-10-CM

## 2021-02-04 DIAGNOSIS — E538 Deficiency of other specified B group vitamins: Secondary | ICD-10-CM

## 2021-02-04 LAB — POCT GLYCOSYLATED HEMOGLOBIN (HGB A1C): Hemoglobin A1C: 5.3 % (ref 4.0–5.6)

## 2021-02-04 MED ORDER — METOPROLOL SUCCINATE ER 25 MG PO TB24: 12.5000 mg | ORAL_TABLET | Freq: Every day | ORAL | 0 refills | Status: DC

## 2021-02-04 MED ORDER — FLUTICASONE PROPIONATE 50 MCG/ACT NA SUSP: 2.0000 | Freq: Every day | NASAL | 0 refills | Status: DC

## 2021-02-04 MED ORDER — TORSEMIDE 20 MG PO TABS: 20.0000 mg | ORAL_TABLET | Freq: Every day | ORAL | 1 refills | Status: DC

## 2021-02-05 ENCOUNTER — Other Ambulatory Visit: Payer: Self-pay

## 2021-02-05 DIAGNOSIS — R809 Proteinuria, unspecified: Secondary | ICD-10-CM

## 2021-02-05 NOTE — Progress Notes (Unsigned)
spep   

## 2021-02-08 LAB — TEST AUTHORIZATION

## 2021-02-08 LAB — PROTEIN ELECTROPHORESIS, SERUM
Albumin ELP: 4 g/dL (ref 3.8–4.8)
Alpha 1: 0.4 g/dL — ABNORMAL HIGH (ref 0.2–0.3)
Alpha 2: 0.8 g/dL (ref 0.5–0.9)
Beta 2: 0.5 g/dL (ref 0.2–0.5)
Beta Globulin: 0.5 g/dL (ref 0.4–0.6)
Gamma Globulin: 2.1 g/dL — ABNORMAL HIGH (ref 0.8–1.7)
Total Protein: 8.3 g/dL — ABNORMAL HIGH (ref 6.1–8.1)

## 2021-02-08 LAB — CBC WITH DIFFERENTIAL/PLATELET
Absolute Monocytes: 382 cells/uL (ref 200–950)
Basophils Absolute: 21 cells/uL (ref 0–200)
Basophils Relative: 0.4 %
Eosinophils Absolute: 159 cells/uL (ref 15–500)
Eosinophils Relative: 3 %
HCT: 38.1 % (ref 35.0–45.0)
Hemoglobin: 12.5 g/dL (ref 11.7–15.5)
Lymphs Abs: 1071 cells/uL (ref 850–3900)
MCH: 31.7 pg (ref 27.0–33.0)
MCHC: 32.8 g/dL (ref 32.0–36.0)
MCV: 96.7 fL (ref 80.0–100.0)
MPV: 10.7 fL (ref 7.5–12.5)
Monocytes Relative: 7.2 %
Neutro Abs: 3668 cells/uL (ref 1500–7800)
Neutrophils Relative %: 69.2 %
Platelets: 139 10*3/uL — ABNORMAL LOW (ref 140–400)
RBC: 3.94 10*6/uL (ref 3.80–5.10)
RDW: 14.4 % (ref 11.0–15.0)
Total Lymphocyte: 20.2 %
WBC: 5.3 10*3/uL (ref 3.8–10.8)

## 2021-02-08 LAB — LIPID PANEL
Cholesterol: 157 mg/dL (ref <200)
HDL: 73 mg/dL (ref >=50)
LDL Cholesterol (Calc): 69 mg/dL (calc)
Non-HDL Cholesterol (Calc): 84 mg/dL (calc) (ref <130)
Total CHOL/HDL Ratio: 2.2 (calc) (ref <5.0)
Triglycerides: 71 mg/dL (ref <150)

## 2021-02-08 LAB — COMPLETE METABOLIC PANEL WITH GFR
AG Ratio: 1 (calc) (ref 1.0–2.5)
ALT: 14 U/L (ref 6–29)
AST: 19 U/L (ref 10–35)
Albumin: 4.1 g/dL (ref 3.6–5.1)
Alkaline phosphatase (APISO): 99 U/L (ref 37–153)
BUN/Creatinine Ratio: 14 (calc) (ref 6–22)
BUN: 18 mg/dL (ref 7–25)
CO2: 28 mmol/L (ref 20–32)
Calcium: 9.8 mg/dL (ref 8.6–10.4)
Chloride: 103 mmol/L (ref 98–110)
Creat: 1.27 mg/dL — ABNORMAL HIGH (ref 0.60–0.93)
GFR, Est African American: 48 mL/min/{1.73_m2} — ABNORMAL LOW (ref >=60)
GFR, Est Non African American: 41 mL/min/{1.73_m2} — ABNORMAL LOW (ref >=60)
Globulin: 4.3 g/dL (calc) — ABNORMAL HIGH (ref 1.9–3.7)
Glucose, Bld: 91 mg/dL (ref 65–99)
Potassium: 4.5 mmol/L (ref 3.5–5.3)
Sodium: 139 mmol/L (ref 135–146)
Total Bilirubin: 0.5 mg/dL (ref 0.2–1.2)
Total Protein: 8.4 g/dL — ABNORMAL HIGH (ref 6.1–8.1)

## 2021-02-08 LAB — VITAMIN B12: Vitamin B-12: 608 pg/mL (ref 200–1100)

## 2021-02-13 DIAGNOSIS — I890 Lymphedema, not elsewhere classified: Secondary | ICD-10-CM | POA: Diagnosis not present

## 2021-02-13 DIAGNOSIS — N183 Chronic kidney disease, stage 3 unspecified: Secondary | ICD-10-CM | POA: Diagnosis not present

## 2021-02-13 DIAGNOSIS — Z96642 Presence of left artificial hip joint: Secondary | ICD-10-CM | POA: Diagnosis not present

## 2021-02-13 DIAGNOSIS — Z96651 Presence of right artificial knee joint: Secondary | ICD-10-CM | POA: Diagnosis not present

## 2021-02-13 DIAGNOSIS — E785 Hyperlipidemia, unspecified: Secondary | ICD-10-CM | POA: Diagnosis not present

## 2021-02-13 DIAGNOSIS — E538 Deficiency of other specified B group vitamins: Secondary | ICD-10-CM | POA: Diagnosis not present

## 2021-02-13 DIAGNOSIS — G5722 Lesion of femoral nerve, left lower limb: Secondary | ICD-10-CM | POA: Diagnosis not present

## 2021-02-13 DIAGNOSIS — E1169 Type 2 diabetes mellitus with other specified complication: Secondary | ICD-10-CM | POA: Diagnosis not present

## 2021-02-13 DIAGNOSIS — I10 Essential (primary) hypertension: Secondary | ICD-10-CM | POA: Diagnosis not present

## 2021-02-14 ENCOUNTER — Telehealth: Payer: Self-pay

## 2021-02-14 DIAGNOSIS — E538 Deficiency of other specified B group vitamins: Secondary | ICD-10-CM | POA: Diagnosis not present

## 2021-02-14 DIAGNOSIS — Z96642 Presence of left artificial hip joint: Secondary | ICD-10-CM | POA: Diagnosis not present

## 2021-02-14 DIAGNOSIS — M5033 Other cervical disc degeneration, cervicothoracic region: Secondary | ICD-10-CM | POA: Diagnosis not present

## 2021-02-14 DIAGNOSIS — E441 Mild protein-calorie malnutrition: Secondary | ICD-10-CM | POA: Diagnosis not present

## 2021-02-14 DIAGNOSIS — E1140 Type 2 diabetes mellitus with diabetic neuropathy, unspecified: Secondary | ICD-10-CM | POA: Diagnosis not present

## 2021-02-14 DIAGNOSIS — E039 Hypothyroidism, unspecified: Secondary | ICD-10-CM | POA: Diagnosis not present

## 2021-02-14 DIAGNOSIS — Z96652 Presence of left artificial knee joint: Secondary | ICD-10-CM | POA: Diagnosis not present

## 2021-02-14 DIAGNOSIS — I890 Lymphedema, not elsewhere classified: Secondary | ICD-10-CM | POA: Diagnosis not present

## 2021-02-14 DIAGNOSIS — M159 Polyosteoarthritis, unspecified: Secondary | ICD-10-CM | POA: Diagnosis not present

## 2021-02-14 DIAGNOSIS — I272 Pulmonary hypertension, unspecified: Secondary | ICD-10-CM | POA: Diagnosis not present

## 2021-02-14 DIAGNOSIS — Z9181 History of falling: Secondary | ICD-10-CM | POA: Diagnosis not present

## 2021-02-14 DIAGNOSIS — D631 Anemia in chronic kidney disease: Secondary | ICD-10-CM | POA: Diagnosis not present

## 2021-02-14 DIAGNOSIS — E785 Hyperlipidemia, unspecified: Secondary | ICD-10-CM | POA: Diagnosis not present

## 2021-02-14 DIAGNOSIS — I129 Hypertensive chronic kidney disease with stage 1 through stage 4 chronic kidney disease, or unspecified chronic kidney disease: Secondary | ICD-10-CM | POA: Diagnosis not present

## 2021-02-14 DIAGNOSIS — N183 Chronic kidney disease, stage 3 unspecified: Secondary | ICD-10-CM | POA: Diagnosis not present

## 2021-02-14 DIAGNOSIS — N179 Acute kidney failure, unspecified: Secondary | ICD-10-CM | POA: Diagnosis not present

## 2021-02-14 DIAGNOSIS — G4733 Obstructive sleep apnea (adult) (pediatric): Secondary | ICD-10-CM | POA: Diagnosis not present

## 2021-02-14 DIAGNOSIS — Z86718 Personal history of other venous thrombosis and embolism: Secondary | ICD-10-CM | POA: Diagnosis not present

## 2021-02-14 DIAGNOSIS — E1169 Type 2 diabetes mellitus with other specified complication: Secondary | ICD-10-CM | POA: Diagnosis not present

## 2021-02-14 DIAGNOSIS — G588 Other specified mononeuropathies: Secondary | ICD-10-CM | POA: Diagnosis not present

## 2021-02-14 DIAGNOSIS — E1122 Type 2 diabetes mellitus with diabetic chronic kidney disease: Secondary | ICD-10-CM | POA: Diagnosis not present

## 2021-02-14 LAB — MICROALBUMIN / CREATININE URINE RATIO
Creatinine, Urine: 130 mg/dL (ref 20–275)
Microalb Creat Ratio: 5 mcg/mg creat (ref <30)
Microalb, Ur: 0.6 mg/dL

## 2021-02-14 NOTE — Telephone Encounter (Signed)
Verbal given 

## 2021-02-14 NOTE — Telephone Encounter (Signed)
Copied from [place] [phone]. Topic: Quick Communication - Home Health Verbal Orders >> Feb 14, 2021  9:28 AM Tessa Lerner A wrote: Caller/Agency: [facility] / [facility]  Callback Number: [phone]  Requesting OT/PT/Skilled Nursing/Social Work/Speech Therapy: PT  Frequency: 1w1 2w4 1w4

## 2021-02-19 DIAGNOSIS — E1169 Type 2 diabetes mellitus with other specified complication: Secondary | ICD-10-CM | POA: Diagnosis not present

## 2021-02-19 DIAGNOSIS — I272 Pulmonary hypertension, unspecified: Secondary | ICD-10-CM | POA: Diagnosis not present

## 2021-02-19 DIAGNOSIS — G4733 Obstructive sleep apnea (adult) (pediatric): Secondary | ICD-10-CM | POA: Diagnosis not present

## 2021-02-19 DIAGNOSIS — E538 Deficiency of other specified B group vitamins: Secondary | ICD-10-CM | POA: Diagnosis not present

## 2021-02-19 DIAGNOSIS — E1140 Type 2 diabetes mellitus with diabetic neuropathy, unspecified: Secondary | ICD-10-CM | POA: Diagnosis not present

## 2021-02-19 DIAGNOSIS — M159 Polyosteoarthritis, unspecified: Secondary | ICD-10-CM | POA: Diagnosis not present

## 2021-02-19 DIAGNOSIS — E785 Hyperlipidemia, unspecified: Secondary | ICD-10-CM | POA: Diagnosis not present

## 2021-02-19 DIAGNOSIS — N179 Acute kidney failure, unspecified: Secondary | ICD-10-CM | POA: Diagnosis not present

## 2021-02-19 DIAGNOSIS — I129 Hypertensive chronic kidney disease with stage 1 through stage 4 chronic kidney disease, or unspecified chronic kidney disease: Secondary | ICD-10-CM | POA: Diagnosis not present

## 2021-02-19 DIAGNOSIS — Z96642 Presence of left artificial hip joint: Secondary | ICD-10-CM | POA: Diagnosis not present

## 2021-02-19 DIAGNOSIS — M5033 Other cervical disc degeneration, cervicothoracic region: Secondary | ICD-10-CM | POA: Diagnosis not present

## 2021-02-19 DIAGNOSIS — G588 Other specified mononeuropathies: Secondary | ICD-10-CM | POA: Diagnosis not present

## 2021-02-19 DIAGNOSIS — N183 Chronic kidney disease, stage 3 unspecified: Secondary | ICD-10-CM | POA: Diagnosis not present

## 2021-02-19 DIAGNOSIS — L03115 Cellulitis of right lower limb: Secondary | ICD-10-CM | POA: Diagnosis not present

## 2021-02-19 DIAGNOSIS — E441 Mild protein-calorie malnutrition: Secondary | ICD-10-CM | POA: Diagnosis not present

## 2021-02-19 DIAGNOSIS — Z86718 Personal history of other venous thrombosis and embolism: Secondary | ICD-10-CM | POA: Diagnosis not present

## 2021-02-19 DIAGNOSIS — E039 Hypothyroidism, unspecified: Secondary | ICD-10-CM | POA: Diagnosis not present

## 2021-02-19 DIAGNOSIS — D631 Anemia in chronic kidney disease: Secondary | ICD-10-CM | POA: Diagnosis not present

## 2021-02-19 DIAGNOSIS — Z96652 Presence of left artificial knee joint: Secondary | ICD-10-CM | POA: Diagnosis not present

## 2021-02-19 DIAGNOSIS — I890 Lymphedema, not elsewhere classified: Secondary | ICD-10-CM | POA: Diagnosis not present

## 2021-02-19 DIAGNOSIS — E1122 Type 2 diabetes mellitus with diabetic chronic kidney disease: Secondary | ICD-10-CM | POA: Diagnosis not present

## 2021-02-19 DIAGNOSIS — Z9181 History of falling: Secondary | ICD-10-CM | POA: Diagnosis not present

## 2021-02-21 DIAGNOSIS — E1140 Type 2 diabetes mellitus with diabetic neuropathy, unspecified: Secondary | ICD-10-CM | POA: Diagnosis not present

## 2021-02-21 DIAGNOSIS — E441 Mild protein-calorie malnutrition: Secondary | ICD-10-CM | POA: Diagnosis not present

## 2021-02-21 DIAGNOSIS — I129 Hypertensive chronic kidney disease with stage 1 through stage 4 chronic kidney disease, or unspecified chronic kidney disease: Secondary | ICD-10-CM | POA: Diagnosis not present

## 2021-02-21 DIAGNOSIS — Z96652 Presence of left artificial knee joint: Secondary | ICD-10-CM | POA: Diagnosis not present

## 2021-02-21 DIAGNOSIS — I272 Pulmonary hypertension, unspecified: Secondary | ICD-10-CM | POA: Diagnosis not present

## 2021-02-21 DIAGNOSIS — E039 Hypothyroidism, unspecified: Secondary | ICD-10-CM | POA: Diagnosis not present

## 2021-02-21 DIAGNOSIS — D631 Anemia in chronic kidney disease: Secondary | ICD-10-CM | POA: Diagnosis not present

## 2021-02-21 DIAGNOSIS — E538 Deficiency of other specified B group vitamins: Secondary | ICD-10-CM | POA: Diagnosis not present

## 2021-02-21 DIAGNOSIS — Z86718 Personal history of other venous thrombosis and embolism: Secondary | ICD-10-CM | POA: Diagnosis not present

## 2021-02-21 DIAGNOSIS — G4733 Obstructive sleep apnea (adult) (pediatric): Secondary | ICD-10-CM | POA: Diagnosis not present

## 2021-02-21 DIAGNOSIS — I890 Lymphedema, not elsewhere classified: Secondary | ICD-10-CM | POA: Diagnosis not present

## 2021-02-21 DIAGNOSIS — M5033 Other cervical disc degeneration, cervicothoracic region: Secondary | ICD-10-CM | POA: Diagnosis not present

## 2021-02-21 DIAGNOSIS — N183 Chronic kidney disease, stage 3 unspecified: Secondary | ICD-10-CM | POA: Diagnosis not present

## 2021-02-21 DIAGNOSIS — G588 Other specified mononeuropathies: Secondary | ICD-10-CM | POA: Diagnosis not present

## 2021-02-21 DIAGNOSIS — E1169 Type 2 diabetes mellitus with other specified complication: Secondary | ICD-10-CM | POA: Diagnosis not present

## 2021-02-21 DIAGNOSIS — E785 Hyperlipidemia, unspecified: Secondary | ICD-10-CM | POA: Diagnosis not present

## 2021-02-21 DIAGNOSIS — M159 Polyosteoarthritis, unspecified: Secondary | ICD-10-CM | POA: Diagnosis not present

## 2021-02-21 DIAGNOSIS — E1122 Type 2 diabetes mellitus with diabetic chronic kidney disease: Secondary | ICD-10-CM | POA: Diagnosis not present

## 2021-02-21 DIAGNOSIS — Z9181 History of falling: Secondary | ICD-10-CM | POA: Diagnosis not present

## 2021-02-21 DIAGNOSIS — N179 Acute kidney failure, unspecified: Secondary | ICD-10-CM | POA: Diagnosis not present

## 2021-02-21 DIAGNOSIS — Z96642 Presence of left artificial hip joint: Secondary | ICD-10-CM | POA: Diagnosis not present

## 2021-02-23 ENCOUNTER — Emergency Department: Payer: Medicare Other

## 2021-02-23 ENCOUNTER — Emergency Department
Admission: EM | Admit: 2021-02-23 | Discharge: 2021-02-23 | Disposition: A | Discharge status: Home / Self Care | Payer: Medicare Other | Attending: Emergency Medicine | Admitting: Emergency Medicine

## 2021-02-23 ENCOUNTER — Other Ambulatory Visit: Payer: Self-pay

## 2021-02-23 DIAGNOSIS — I471 Supraventricular tachycardia: Secondary | ICD-10-CM | POA: Diagnosis not present

## 2021-02-23 DIAGNOSIS — Z96653 Presence of artificial knee joint, bilateral: Secondary | ICD-10-CM | POA: Diagnosis not present

## 2021-02-23 DIAGNOSIS — N183 Chronic kidney disease, stage 3 unspecified: Secondary | ICD-10-CM | POA: Diagnosis not present

## 2021-02-23 DIAGNOSIS — Z96642 Presence of left artificial hip joint: Secondary | ICD-10-CM | POA: Diagnosis not present

## 2021-02-23 DIAGNOSIS — R0789 Other chest pain: Secondary | ICD-10-CM | POA: Diagnosis not present

## 2021-02-23 DIAGNOSIS — K8020 Calculus of gallbladder without cholecystitis without obstruction: Secondary | ICD-10-CM | POA: Diagnosis not present

## 2021-02-23 DIAGNOSIS — E039 Hypothyroidism, unspecified: Secondary | ICD-10-CM | POA: Insufficient documentation

## 2021-02-23 DIAGNOSIS — R0602 Shortness of breath: Secondary | ICD-10-CM | POA: Diagnosis not present

## 2021-02-23 DIAGNOSIS — J9811 Atelectasis: Secondary | ICD-10-CM | POA: Diagnosis not present

## 2021-02-23 DIAGNOSIS — R000 Tachycardia, unspecified: Secondary | ICD-10-CM | POA: Diagnosis present

## 2021-02-23 DIAGNOSIS — I10 Essential (primary) hypertension: Secondary | ICD-10-CM

## 2021-02-23 DIAGNOSIS — I129 Hypertensive chronic kidney disease with stage 1 through stage 4 chronic kidney disease, or unspecified chronic kidney disease: Secondary | ICD-10-CM | POA: Insufficient documentation

## 2021-02-23 DIAGNOSIS — I499 Cardiac arrhythmia, unspecified: Secondary | ICD-10-CM | POA: Diagnosis not present

## 2021-02-23 DIAGNOSIS — Z7982 Long term (current) use of aspirin: Secondary | ICD-10-CM | POA: Insufficient documentation

## 2021-02-23 DIAGNOSIS — K449 Diaphragmatic hernia without obstruction or gangrene: Secondary | ICD-10-CM | POA: Diagnosis not present

## 2021-02-23 DIAGNOSIS — R079 Chest pain, unspecified: Secondary | ICD-10-CM | POA: Diagnosis not present

## 2021-02-23 DIAGNOSIS — R531 Weakness: Secondary | ICD-10-CM | POA: Diagnosis not present

## 2021-02-23 DIAGNOSIS — Z79899 Other long term (current) drug therapy: Secondary | ICD-10-CM | POA: Insufficient documentation

## 2021-02-23 DIAGNOSIS — Z743 Need for continuous supervision: Secondary | ICD-10-CM | POA: Diagnosis not present

## 2021-02-23 DIAGNOSIS — E1122 Type 2 diabetes mellitus with diabetic chronic kidney disease: Secondary | ICD-10-CM | POA: Insufficient documentation

## 2021-02-23 DIAGNOSIS — Z20822 Contact with and (suspected) exposure to covid-19: Secondary | ICD-10-CM | POA: Insufficient documentation

## 2021-02-23 LAB — CBC WITH DIFFERENTIAL/PLATELET
Abs Immature Granulocytes: 0 10*3/uL (ref 0.00–0.07)
Basophils Absolute: 0 10*3/uL (ref 0.0–0.1)
Basophils Relative: 1 %
Eosinophils Absolute: 0.1 10*3/uL (ref 0.0–0.5)
Eosinophils Relative: 2 %
HCT: 34.8 % — ABNORMAL LOW (ref 36.0–46.0)
Hemoglobin: 11.1 g/dL — ABNORMAL LOW (ref 12.0–15.0)
Immature Granulocytes: 0 %
Lymphocytes Relative: 16 %
Lymphs Abs: 0.8 10*3/uL (ref 0.7–4.0)
MCH: 31.4 pg (ref 26.0–34.0)
MCHC: 31.9 g/dL (ref 30.0–36.0)
MCV: 98.6 fL (ref 80.0–100.0)
Monocytes Absolute: 0.3 10*3/uL (ref 0.1–1.0)
Monocytes Relative: 6 %
Neutro Abs: 3.7 10*3/uL (ref 1.7–7.7)
Neutrophils Relative %: 75 %
Platelets: 146 10*3/uL — ABNORMAL LOW (ref 150–400)
RBC: 3.53 MIL/uL — ABNORMAL LOW (ref 3.87–5.11)
RDW: 15.5 % (ref 11.5–15.5)
WBC: 4.9 10*3/uL (ref 4.0–10.5)
nRBC: 0 % (ref 0.0–0.2)

## 2021-02-23 LAB — COMPREHENSIVE METABOLIC PANEL
ALT: 18 U/L (ref 0–44)
AST: 27 U/L (ref 15–41)
Albumin: 3.7 g/dL (ref 3.5–5.0)
Alkaline Phosphatase: 91 U/L (ref 38–126)
Anion gap: 7 (ref 5–15)
BUN: 20 mg/dL (ref 8–23)
CO2: 26 mmol/L (ref 22–32)
Calcium: 9.2 mg/dL (ref 8.9–10.3)
Chloride: 105 mmol/L (ref 98–111)
Creatinine, Ser: 1.3 mg/dL — ABNORMAL HIGH (ref 0.44–1.00)
GFR, Estimated: 43 mL/min — ABNORMAL LOW (ref >60)
Glucose, Bld: 118 mg/dL — ABNORMAL HIGH (ref 70–99)
Potassium: 4.2 mmol/L (ref 3.5–5.1)
Sodium: 138 mmol/L (ref 135–145)
Total Bilirubin: 0.8 mg/dL (ref 0.3–1.2)
Total Protein: 7.8 g/dL (ref 6.5–8.1)

## 2021-02-23 LAB — TROPONIN I (HIGH SENSITIVITY)
Troponin I (High Sensitivity): 14 ng/L (ref <18)
Troponin I (High Sensitivity): 33 ng/L — ABNORMAL HIGH (ref <18)

## 2021-02-23 LAB — RESP PANEL BY RT-PCR (FLU A&B, COVID) ARPGX2
Influenza A by PCR: NEGATIVE
Influenza B by PCR: NEGATIVE
SARS Coronavirus 2 by RT PCR: NEGATIVE

## 2021-02-23 LAB — MAGNESIUM: Magnesium: 2.3 mg/dL (ref 1.7–2.4)

## 2021-02-23 LAB — T4, FREE: Free T4: 0.85 ng/dL (ref 0.61–1.12)

## 2021-02-23 LAB — D-DIMER, QUANTITATIVE: D-Dimer, Quant: 10.46 ug/mL-FEU — ABNORMAL HIGH (ref 0.00–0.50)

## 2021-02-23 LAB — TSH: TSH: 1.922 u[IU]/mL (ref 0.350–4.500)

## 2021-02-23 LAB — BRAIN NATRIURETIC PEPTIDE: B Natriuretic Peptide: 61 pg/mL (ref 0.0–100.0)

## 2021-02-23 MED ORDER — DILTIAZEM HCL 25 MG/5ML IV SOLN: 10.0000 mg | Freq: Once | INTRAVENOUS | Status: DC

## 2021-02-23 MED ORDER — METOPROLOL SUCCINATE ER 25 MG PO TB24: 25.0000 mg | ORAL_TABLET | Freq: Every day | ORAL | 0 refills | Status: DC

## 2021-02-23 MED ORDER — IOHEXOL 350 MG/ML SOLN
75.0000 mL | Freq: Once | INTRAVENOUS | Status: AC | PRN
  Administered 2021-02-23: 75 mL via INTRAVENOUS

## 2021-02-23 NOTE — ED Notes (Signed)
Patient is resting comfortably. Patient updated on pending transport home. Patient provided snacks, and a beverage, patient denies other needs.

## 2021-02-23 NOTE — ED Triage Notes (Signed)
Per EMS, pt had "funny feeling this morning and just wanted to get checked out," HR was 148 for EMS. BGL was 161. Pt is AOX4, NAD noted. Pt denies history of A-fibb. Denies CP, SOB, dizziness. Edema noted in lower extremities bilaterally.

## 2021-02-23 NOTE — Discharge Instructions (Addendum)
Your CT scan did not show any blood clots or any other new issues.  Start taking a whole pill of metoprolol instead of half.  If you already took a half this morning, you can take the other half this afternoon.  Return the ER if you develop return of your symptoms.  Please follow-up with cardiology.  Please call them to confirm an appointment for next week.

## 2021-02-23 NOTE — ED Notes (Signed)
Pt states she has had a decrease in palpitations.

## 2021-02-23 NOTE — ED Notes (Signed)
EDP notified of troponin of 33

## 2021-02-23 NOTE — ED Notes (Signed)
Discharge instructions provided by previous RN. Patient discharged with transport home.

## 2021-02-23 NOTE — ED Provider Notes (Signed)
[facility] Emergency Department Provider Note  ____________________________________________   Event Date/Time   First MD Initiated Contact with Patient 02/23/21 1030     (approximate)  I have reviewed the triage vital signs and the nursing notes.   HISTORY  Chief Complaint Tachycardia    HPI Becky Trevino is a 75 y.o. female with CKD, diabetes, hypertension, hyperlipidemia, wheelchair dependent, chronic DVT on the right, had to stop blood thinner secondary to hematoma in her ilio psoas OSA on CPAP at nighttime, status post IVC filter placement who comes in for elevated heart rates.  Patient reports that this morning around 9 AM she felt like she had a weird feeling in her chest.  This was constant, nothing makes it better or worse.  Denies ever having this previously.  She denies any shortness of breath, chest pain, abdominal pain with it.  No recent falls.  She does report that she has chronic swelling in her legs that is unchanged.  1 leg is not larger than the other.  She states that she has an IVC filter in place.  She was evaluated by EMS who found that her heart rate was in the 150s therefore patient was brought to the ER for further evaluation          Past Medical History:  Diagnosis Date   Cervical spondylosis    Chronic kidney disease    Stage 3   DDD (degenerative disc disease), cervical    [facility]   Diabetes mellitus without complication (HCC)    Diverticulosis    Hyperlipidemia    Hypertension    Intertrigo    Leg cramps    Leg varices    Obesity    OSA (obstructive sleep apnea)    Symptomatic menopausal or female climacteric states    Syncope     Patient Active Problem List   Diagnosis Date Noted   Anasarca    Hypothyroidism    SVT (supraventricular tachycardia) ([place])    Acute kidney injury superimposed on CKD (HCC)    Hypotension    Class 2 obesity without serious comorbidity with body mass index (BMI) of 39.0  to 39.9 in adult    Thrombocytopenia (HCC)    Mild protein-calorie malnutrition (HCC)    Hyperbilirubinemia    Injury of left femoral nerve 08/28/2020   History of DVT (deep vein thrombosis) 06/25/2020   Lymphedema 06/25/2020   History of pelvic hematoma 02/10/2020   Foot ulcer, right, limited to breakdown of skin ([place]) 12/02/2019   MRSA bacteremia 10/30/2019   Chronic venous hypertension due to deep vein thrombosis (DVT) 10/30/2019   Hematoma    Normocytic anemia 08/07/2019   Chronic infection of right knee ([place]) 11/25/2018   Infection and inflammatory reaction due to internal joint prosthesis, subsequent encounter 01/25/2018   Infection of total right knee replacement ([place]) 11/19/2017   Type 2 diabetes mellitus with hyperlipidemia ([place]) 11/06/2016   Primary osteoarthritis of knee 03/11/2016   Primary osteoarthritis of left hip 12/11/2015   Benign essential HTN 04/21/2015   Edema leg 04/21/2015   Chronic kidney disease (CKD), stage III (moderate) ([place]) 04/21/2015   Chronic venous insufficiency 04/21/2015   DDD (degenerative disc disease), cervical 04/21/2015   Diabetes mellitus with renal manifestation ([place]) 04/21/2015   Hyperlipidemia 04/21/2015   Bladder cystocele 04/21/2015   Urinary incontinence 04/21/2015   Leg varices 04/21/2015   Insomnia 04/21/2015   Eczema intertrigo 04/21/2015   Obesity, Class III, BMI 40-49.9 (morbid obesity) (  [place]) 04/21/2015   OSA (obstructive sleep apnea) 04/21/2015   Menopausal and perimenopausal disorder 04/21/2015   Supraventricular premature beats 04/21/2015   Osteoarthritis of multiple joints 04/21/2015   H/O Spinal surgery 11/14/2013   Detrusor muscle hypertonia 11/07/2013    Past Surgical History:  Procedure Laterality Date   ABDOMINAL HYSTERECTOMY  1993   Total   BUNIONECTOMY Bilateral [place] Right 11/19/2017   Procedure: RIGHT KNEE POLY EXCHANGE WITH IRRIGATION AND DEBRIDEMENT;  Surgeon: Hessie Knows,  MD;  Location: [facility] OR[facility];  Service: Orthopedics;  Laterality: Right;   JOINT REPLACEMENT     bilateral knee   LAMINECTOMY  11/14/2013    Cervical Fusion , [facility], Dr. Delilah Shan   LASER ABLATION Bilateral 07/29/2012   Dr. Lucky Cowboy   LOWER EXTREMITY ANGIOGRAPHY Right 11/02/2019   Procedure: Lower Extremity Angiography;  Surgeon: Katha Cabal, MD;  Location: [place] [facility];  Service: Cardiovascular;  Laterality: Right;   ORIF ANKLE FRACTURE Right 09/02/2019   Procedure: OPEN REDUCTION INTERNAL FIXATION (ORIF) ANKLE FRACTURE BIMALLEOLAR;  Surgeon: [name], DPM;  Location: [facility];  Service: Podiatry;  Laterality: Right;   PERIPHERAL VASCULAR THROMBECTOMY Right 03/02/2020   Procedure: PERIPHERAL VASCULAR THROMBECTOMY;  Surgeon: Algernon Huxley, MD;  Location: [place] [facility];  Service: Cardiovascular;  Laterality: Right;   REPLACEMENT TOTAL KNEE Left 03/2010   Dr. Rudene Christians   TOTAL HIP ARTHROPLASTY Left 12/11/2015   Procedure: TOTAL HIP ARTHROPLASTY ANTERIOR APPROACH;  Surgeon: Hessie Knows, MD;  Location: [facility];  Service: Orthopedics;  Laterality: Left;   TOTAL KNEE ARTHROPLASTY Right 03/11/2016   Procedure: TOTAL KNEE ARTHROPLASTY;  Surgeon: Hessie Knows, MD;  Location: [facility];  Service: Orthopedics;  Laterality: Right;    Prior to Admission medications   Medication Sig Start Date End Date Taking? Authorizing Provider  acetaminophen (TYLENOL) 650 MG CR tablet Take 650-1,300 mg by mouth 2 (two) times daily as needed for pain.    [provider]  ASPIRIN LOW DOSE 81 MG EC tablet TAKE 1 TABLET BY MOUTH EVERY DAY 01/29/21   Ancil Boozer, Drue Stager, MD  atorvastatin (LIPITOR) 40 MG tablet Take 1 tablet (40 mg total) by mouth every evening. 11/14/20   [name], MD  Calcium Carbonate-Vitamin D 600-400 MG-UNIT tablet Take 1 tablet by mouth 2 (two) times daily. 11/14/20   [name], MD  doxycycline (VIBRA-TABS) 100 MG tablet Take 1 tablet by mouth in the morning and at bedtime. 11/12/20  08/09/21  Leonel Ramsay, MD  fluticasone (FLONASE) 50 MCG/ACT nasal spray Place 2 sprays into both nostrils daily. 02/04/21   [name], MD  ketoconazole (NIZORAL) 2 % cream Apply 1 application topically 2 (two) times daily as needed (rash).  01/26/15   [provider]  latanoprost (XALATAN) 0.005 % ophthalmic solution SMARTSIG:In Eye(s) 07/12/20   [provider]  levothyroxine (SYNTHROID) 25 MCG tablet TAKE 1 TABLET (25 MCG TOTAL) BY MOUTH DAILY AT 6 (SIX) AM. 01/29/21 02/28/21  [name], MD  Magnesium Oxide 500 MG CAPS Take 500 mg by mouth daily.  11/10/07   [provider]  metoprolol succinate (TOPROL-XL) 25 MG 24 hr tablet Take 0.5 tablets (12.5 mg total) by mouth daily. 02/04/21   [name], MD  mirabegron ER (MYRBETRIQ) 50 MG TB24 tablet Take 1 tablet (50 mg total) by mouth daily. 06/28/20   [name], Larene Beach A, PA-C  polyethylene glycol (MIRALAX / GLYCOLAX) 17 g packet Take 17 g by mouth  daily. 09/15/20   Antonieta Pert, MD  potassium chloride (KLOR-CON) 10 MEQ tablet Take 10 mEq by mouth daily. 08/18/19   [provider]  SIMBRINZA 1-0.2 % SUSP Place 1 drop into both eyes 2 (two) times daily. 10/13/19   [provider]  torsemide (DEMADEX) 20 MG tablet Take 1 tablet (20 mg total) by mouth daily. 02/04/21 03/06/21  [name], MD    Allergies Lisinopril  Family History  Problem Relation Age of Onset   Diabetes Mother    Hyperlipidemia Mother    Hypertension Mother    Diabetes Father    Hyperlipidemia Father    Hypertension Father    Obesity Father    Hypertension Sister    Hyperlipidemia Sister    Hyperlipidemia Sister    Breast cancer Neg Hx     Social History Social History   Tobacco Use   Smoking status: Never   Smokeless tobacco: Never  Vaping Use   Vaping Use: Never used  Substance Use Topics   Alcohol use: No    Alcohol/week: 0.0 standard drinks   Drug use: No      Review of  Systems Constitutional: No fever/chills Eyes: No visual changes. ENT: No sore throat. Cardiovascular: Positive palpitations Respiratory: Denies shortness of breath. Gastrointestinal: No abdominal pain.  No nausea, no vomiting.  No diarrhea.  No constipation. Genitourinary: Negative for dysuria. Musculoskeletal: Negative for back pain. Skin: Negative for rash. Neurological: Negative for headaches, focal weakness or numbness. All other ROS negative ____________________________________________   PHYSICAL EXAM:  VITAL SIGNS: ED Triage Vitals  Enc Vitals Group     BP 02/23/21 1036 (!) 126/58     Pulse Rate 02/23/21 1036 (!) 153     Resp 02/23/21 1036 18     Temp --      Temp src --      SpO2 02/23/21 1036 96 %     Weight --      Height --      Head Circumference --      Peak Flow --      Pain Score 02/23/21 1037 0     Pain Loc --      Pain Edu? --      Excl. in [place]? --     Constitutional: Alert and oriented. Well appearing and in no acute distress. Eyes: Conjunctivae are normal. EOMI. Head: Atraumatic. Nose: No congestion/rhinnorhea. Mouth/Throat: Mucous membranes are moist.   Neck: No stridor. Trachea Midline. FROM Cardiovascular: Tachycardic, grossly normal heart sounds.  Good peripheral circulation. Respiratory: Normal respiratory effort.  No retractions. Lungs CTAB. Gastrointestinal: Soft and nontender. No distention. No abdominal bruits.  Musculoskeletal: No lower extremity tenderness nor edema.  No joint effusions. Neurologic:  Normal speech and language. No gross focal neurologic deficits are appreciated.  Skin:  Skin is warm, dry and intact. No rash noted. Psychiatric: Mood and affect are normal. Speech and behavior are normal. GU: Deferred   ____________________________________________   LABS (all labs ordered are listed, but only abnormal results are displayed)  Labs Reviewed  RESP PANEL BY RT-PCR (FLU A&B, COVID) ARPGX2  CBC WITH DIFFERENTIAL/PLATELET   COMPREHENSIVE METABOLIC PANEL  MAGNESIUM  TSH  T4, FREE  D-DIMER, QUANTITATIVE  BRAIN NATRIURETIC PEPTIDE  TROPONIN I (HIGH SENSITIVITY)   ____________________________________________   ED ECG REPORT I, Vanessa [place], the attending physician, personally viewed and interpreted this ECG.  Supraventricular tachycardia rate of 152, no ST elevation, T wave version in V2, incomplete right bundle branch block  ____________________________________________  RADIOLOGY Robert Bellow, personally viewed and evaluated these images (plain radiographs) as part of my medical decision making, as well as reviewing the written report by the radiologist.  ED MD interpretation:   no PNA   Official radiology report(s): DG Chest Portable 1 View  Result Date: 02/23/2021 CLINICAL DATA:  Shortness of breath EXAM: PORTABLE CHEST 1 VIEW COMPARISON:  09/09/2020 FINDINGS: Low volume chest with borderline heart size distorted by rotation. Aortic tortuosity. There is no edema, consolidation, effusion, or pneumothorax. Severe glenohumeral arthritis on both sides with chronic anterior glenohumeral dislocation on the left IMPRESSION: Stable from prior.  No acute finding. Electronically Signed   By: Monte Fantasia M.D.   On: 02/23/2021 11:26    ____________________________________________   PROCEDURES  Procedure(s) performed (including Critical Care):  .1-3 Lead EKG Interpretation  Date/Time: 02/23/2021 1:41 PM Performed by: Vanessa Braden, MD Authorized by: Vanessa Clyde, MD     Interpretation: abnormal     ECG rate:  150s   ECG rate assessment: tachycardic     Rhythm: other rhythm     Ectopy: none     Conduction: normal   Comments:     Possible a flutter versus SVT but without any interventions he converted back into normal sinus   ____________________________________________   INITIAL IMPRESSION / ASSESSMENT AND PLAN / ED COURSE  SAJA BARTOLINI was evaluated in Emergency Department on  02/23/2021 for the symptoms described in the history of present illness. She was evaluated in the context of the global COVID-19 pandemic, which necessitated consideration that the patient might be at risk for infection with the SARS-CoV-2 virus that causes COVID-19. Institutional protocols and algorithms that pertain to the evaluation of patients at risk for COVID-19 are in a state of rapid change based on information released by regulatory bodies including the CDC and federal and state organizations. These policies and algorithms were followed during the patient's care in the ED.    Patient is a 75 year old who comes in with tachycardia.  Labs ordered to evaluate for Electra abnormalities, AKI, thyroid dysfunction, chest x-ray to evaluate for pneumonia, consider ACS, PE.  Patient's heart rate appears to be supraventricular tachycardia with a rate of 150.  Given patient's age suspect this is most likely underlying atrial flutter therefore we will start off with 10 mg of diltiazem.  Prior to giving the medications patient's heart rate was in normal sinus.  1:42 PM Labs are reassuring.  Kidney function around baseline.  D-dimer was elevated therefore will get CT PE to evaluate for pulmonary embolism.  Patient has known DVTs with IVC filter in place and denies any new leg swelling to need additional leg ultrasounds.  On my reevaluation patient remains in normal sinus.  2:55 PM reevaluated patient she remains in normal sinus and denies any chest pain.  Cardiac markers slightly trending upward.  Discussed with Dr. Nehemiah Massed most likely demand in nature.  They will follow-up with her in clinic.  We will hold off on blood thinner given prior psoas muscle bleed.  Will increase her metoprolol from 12.5 to 25 mg per cardiology  Patient handed to oncoming team pending CT scan and if negative will be discharged home if remains in normal sinus          ____________________________________________   FINAL  CLINICAL IMPRESSION(S) / ED DIAGNOSES   Final diagnoses:  SVT (supraventricular tachycardia) (HCC)  Benign essential HTN      MEDICATIONS GIVEN DURING THIS VISIT:  Medications - No data to display   ED Discharge Orders          Ordered    metoprolol succinate (TOPROL-XL) 25 MG 24 hr tablet  Daily        02/23/21 1456             Note:  This document was prepared using Dragon voice recognition software and may include unintentional dictation errors.    Vanessa [place], MD 02/23/21 [phone]

## 2021-02-23 NOTE — ED Notes (Signed)
CT states they will take pt next.

## 2021-02-24 DIAGNOSIS — L03115 Cellulitis of right lower limb: Secondary | ICD-10-CM | POA: Diagnosis not present

## 2021-02-24 DIAGNOSIS — E1140 Type 2 diabetes mellitus with diabetic neuropathy, unspecified: Secondary | ICD-10-CM | POA: Diagnosis not present

## 2021-02-24 DIAGNOSIS — R2681 Unsteadiness on feet: Secondary | ICD-10-CM | POA: Diagnosis not present

## 2021-02-25 ENCOUNTER — Telehealth: Payer: Self-pay | Admitting: Family Medicine

## 2021-02-25 ENCOUNTER — Telehealth: Payer: Self-pay

## 2021-02-25 DIAGNOSIS — E1140 Type 2 diabetes mellitus with diabetic neuropathy, unspecified: Secondary | ICD-10-CM | POA: Diagnosis not present

## 2021-02-25 DIAGNOSIS — E1169 Type 2 diabetes mellitus with other specified complication: Secondary | ICD-10-CM | POA: Diagnosis not present

## 2021-02-25 DIAGNOSIS — E538 Deficiency of other specified B group vitamins: Secondary | ICD-10-CM | POA: Diagnosis not present

## 2021-02-25 DIAGNOSIS — Z96642 Presence of left artificial hip joint: Secondary | ICD-10-CM | POA: Diagnosis not present

## 2021-02-25 DIAGNOSIS — I129 Hypertensive chronic kidney disease with stage 1 through stage 4 chronic kidney disease, or unspecified chronic kidney disease: Secondary | ICD-10-CM | POA: Diagnosis not present

## 2021-02-25 DIAGNOSIS — N179 Acute kidney failure, unspecified: Secondary | ICD-10-CM | POA: Diagnosis not present

## 2021-02-25 DIAGNOSIS — I890 Lymphedema, not elsewhere classified: Secondary | ICD-10-CM | POA: Diagnosis not present

## 2021-02-25 DIAGNOSIS — E785 Hyperlipidemia, unspecified: Secondary | ICD-10-CM | POA: Diagnosis not present

## 2021-02-25 DIAGNOSIS — Z9181 History of falling: Secondary | ICD-10-CM | POA: Diagnosis not present

## 2021-02-25 DIAGNOSIS — G588 Other specified mononeuropathies: Secondary | ICD-10-CM | POA: Diagnosis not present

## 2021-02-25 DIAGNOSIS — G4733 Obstructive sleep apnea (adult) (pediatric): Secondary | ICD-10-CM | POA: Diagnosis not present

## 2021-02-25 DIAGNOSIS — M5033 Other cervical disc degeneration, cervicothoracic region: Secondary | ICD-10-CM | POA: Diagnosis not present

## 2021-02-25 DIAGNOSIS — I272 Pulmonary hypertension, unspecified: Secondary | ICD-10-CM | POA: Diagnosis not present

## 2021-02-25 DIAGNOSIS — E441 Mild protein-calorie malnutrition: Secondary | ICD-10-CM | POA: Diagnosis not present

## 2021-02-25 DIAGNOSIS — M159 Polyosteoarthritis, unspecified: Secondary | ICD-10-CM | POA: Diagnosis not present

## 2021-02-25 DIAGNOSIS — E039 Hypothyroidism, unspecified: Secondary | ICD-10-CM | POA: Diagnosis not present

## 2021-02-25 DIAGNOSIS — Z96652 Presence of left artificial knee joint: Secondary | ICD-10-CM | POA: Diagnosis not present

## 2021-02-25 DIAGNOSIS — N183 Chronic kidney disease, stage 3 unspecified: Secondary | ICD-10-CM | POA: Diagnosis not present

## 2021-02-25 DIAGNOSIS — E1122 Type 2 diabetes mellitus with diabetic chronic kidney disease: Secondary | ICD-10-CM | POA: Diagnosis not present

## 2021-02-25 DIAGNOSIS — D631 Anemia in chronic kidney disease: Secondary | ICD-10-CM | POA: Diagnosis not present

## 2021-02-25 DIAGNOSIS — Z86718 Personal history of other venous thrombosis and embolism: Secondary | ICD-10-CM | POA: Diagnosis not present

## 2021-02-25 NOTE — Telephone Encounter (Signed)
Pt has an appt on 06/07/21

## 2021-02-25 NOTE — Telephone Encounter (Signed)
Pt called in about getting a medication refill, pt could not remember the name of the medication, but it was for sleeping. Pt also stated she had been speaking with [name] about an electric bed. Pt requested a call back to get that medication refilled. Please advise

## 2021-02-26 ENCOUNTER — Other Ambulatory Visit: Payer: Self-pay | Admitting: Family Medicine

## 2021-02-26 DIAGNOSIS — L03115 Cellulitis of right lower limb: Secondary | ICD-10-CM | POA: Diagnosis not present

## 2021-02-26 MED ORDER — TRAZODONE HCL 50 MG PO TABS: 25.0000 mg | ORAL_TABLET | Freq: Every evening | ORAL | 1 refills | Status: DC | PRN

## 2021-02-27 DIAGNOSIS — I272 Pulmonary hypertension, unspecified: Secondary | ICD-10-CM | POA: Diagnosis not present

## 2021-02-27 DIAGNOSIS — E785 Hyperlipidemia, unspecified: Secondary | ICD-10-CM | POA: Diagnosis not present

## 2021-02-27 DIAGNOSIS — M159 Polyosteoarthritis, unspecified: Secondary | ICD-10-CM | POA: Diagnosis not present

## 2021-02-27 DIAGNOSIS — N179 Acute kidney failure, unspecified: Secondary | ICD-10-CM | POA: Diagnosis not present

## 2021-02-27 DIAGNOSIS — D631 Anemia in chronic kidney disease: Secondary | ICD-10-CM | POA: Diagnosis not present

## 2021-02-27 DIAGNOSIS — G4733 Obstructive sleep apnea (adult) (pediatric): Secondary | ICD-10-CM | POA: Diagnosis not present

## 2021-02-27 DIAGNOSIS — I129 Hypertensive chronic kidney disease with stage 1 through stage 4 chronic kidney disease, or unspecified chronic kidney disease: Secondary | ICD-10-CM | POA: Diagnosis not present

## 2021-02-27 DIAGNOSIS — I890 Lymphedema, not elsewhere classified: Secondary | ICD-10-CM | POA: Diagnosis not present

## 2021-02-27 DIAGNOSIS — Z86718 Personal history of other venous thrombosis and embolism: Secondary | ICD-10-CM | POA: Diagnosis not present

## 2021-02-27 DIAGNOSIS — E1122 Type 2 diabetes mellitus with diabetic chronic kidney disease: Secondary | ICD-10-CM | POA: Diagnosis not present

## 2021-02-27 DIAGNOSIS — N183 Chronic kidney disease, stage 3 unspecified: Secondary | ICD-10-CM | POA: Diagnosis not present

## 2021-02-27 DIAGNOSIS — E039 Hypothyroidism, unspecified: Secondary | ICD-10-CM | POA: Diagnosis not present

## 2021-02-27 DIAGNOSIS — E538 Deficiency of other specified B group vitamins: Secondary | ICD-10-CM | POA: Diagnosis not present

## 2021-02-27 DIAGNOSIS — E1169 Type 2 diabetes mellitus with other specified complication: Secondary | ICD-10-CM | POA: Diagnosis not present

## 2021-02-27 DIAGNOSIS — Z96642 Presence of left artificial hip joint: Secondary | ICD-10-CM | POA: Diagnosis not present

## 2021-02-27 DIAGNOSIS — Z9181 History of falling: Secondary | ICD-10-CM | POA: Diagnosis not present

## 2021-02-27 DIAGNOSIS — Z96652 Presence of left artificial knee joint: Secondary | ICD-10-CM | POA: Diagnosis not present

## 2021-02-27 DIAGNOSIS — M5033 Other cervical disc degeneration, cervicothoracic region: Secondary | ICD-10-CM | POA: Diagnosis not present

## 2021-02-27 DIAGNOSIS — G588 Other specified mononeuropathies: Secondary | ICD-10-CM | POA: Diagnosis not present

## 2021-02-27 DIAGNOSIS — E441 Mild protein-calorie malnutrition: Secondary | ICD-10-CM | POA: Diagnosis not present

## 2021-02-27 DIAGNOSIS — E1140 Type 2 diabetes mellitus with diabetic neuropathy, unspecified: Secondary | ICD-10-CM | POA: Diagnosis not present

## 2021-02-28 ENCOUNTER — Ambulatory Visit: Payer: Self-pay | Admitting: *Deleted

## 2021-02-28 NOTE — Telephone Encounter (Signed)
Pt called in just curious if Synthroid can cause her to gain weight.   I let her know blood work would need to be done and also her signs and symptoms would need to be factored in to determine if the Synthroid is causing her to gain weight.    She has an appt coming up with Dr. Ancil Boozer to discuss these issues.   "It was on my mind is the reason I called".   She was agreeable to discussing this further with Dr Ancil Boozer during her upcoming appt.

## 2021-02-28 NOTE — Telephone Encounter (Signed)
Reason for Disposition  Caller has medicine question only, adult not sick, AND triager answers question    Having questions about Synthroid.   She has an appt with Dr. Ancil Boozer coming up to discuss her weight concerns.  Answer Assessment - Initial Assessment Questions 1. NAME of MEDICATION: "What medicine are you calling about?"     Synthroid   2. QUESTION: "What is your question?" (e.g., double dose of medicine, side effect)     Gaining weight.   Gained weight from when in hospital in December.   I can't tell.   I can't stand up to be weighed.   My left leg I can't stand on it.   I was 317. 3. PRESCRIBING HCP: "Who prescribed it?" Reason: if prescribed by specialist, call should be referred to that group.     [place] 4. SYMPTOMS: "Do you have any symptoms?"     Both legs are swollen but I've had this problem.    It's nothing new for me. 5. SEVERITY: If symptoms are present, ask "Are they mild, moderate or severe?"     N/A 6. PREGNANCY:  "Is there any chance that you are pregnant?" "When was your last menstrual period?"     N/A  Protocols used: Medication Question Call-A-AH

## 2021-03-04 DIAGNOSIS — M5033 Other cervical disc degeneration, cervicothoracic region: Secondary | ICD-10-CM | POA: Diagnosis not present

## 2021-03-04 DIAGNOSIS — Z96652 Presence of left artificial knee joint: Secondary | ICD-10-CM | POA: Diagnosis not present

## 2021-03-04 DIAGNOSIS — I129 Hypertensive chronic kidney disease with stage 1 through stage 4 chronic kidney disease, or unspecified chronic kidney disease: Secondary | ICD-10-CM | POA: Diagnosis not present

## 2021-03-04 DIAGNOSIS — E1169 Type 2 diabetes mellitus with other specified complication: Secondary | ICD-10-CM | POA: Diagnosis not present

## 2021-03-04 DIAGNOSIS — E538 Deficiency of other specified B group vitamins: Secondary | ICD-10-CM | POA: Diagnosis not present

## 2021-03-04 DIAGNOSIS — D631 Anemia in chronic kidney disease: Secondary | ICD-10-CM | POA: Diagnosis not present

## 2021-03-04 DIAGNOSIS — E039 Hypothyroidism, unspecified: Secondary | ICD-10-CM | POA: Diagnosis not present

## 2021-03-04 DIAGNOSIS — I272 Pulmonary hypertension, unspecified: Secondary | ICD-10-CM | POA: Diagnosis not present

## 2021-03-04 DIAGNOSIS — G4733 Obstructive sleep apnea (adult) (pediatric): Secondary | ICD-10-CM | POA: Diagnosis not present

## 2021-03-04 DIAGNOSIS — E441 Mild protein-calorie malnutrition: Secondary | ICD-10-CM | POA: Diagnosis not present

## 2021-03-04 DIAGNOSIS — Z9181 History of falling: Secondary | ICD-10-CM | POA: Diagnosis not present

## 2021-03-04 DIAGNOSIS — E785 Hyperlipidemia, unspecified: Secondary | ICD-10-CM | POA: Diagnosis not present

## 2021-03-04 DIAGNOSIS — N179 Acute kidney failure, unspecified: Secondary | ICD-10-CM | POA: Diagnosis not present

## 2021-03-04 DIAGNOSIS — E1140 Type 2 diabetes mellitus with diabetic neuropathy, unspecified: Secondary | ICD-10-CM | POA: Diagnosis not present

## 2021-03-04 DIAGNOSIS — I890 Lymphedema, not elsewhere classified: Secondary | ICD-10-CM | POA: Diagnosis not present

## 2021-03-04 DIAGNOSIS — G588 Other specified mononeuropathies: Secondary | ICD-10-CM | POA: Diagnosis not present

## 2021-03-04 DIAGNOSIS — E1122 Type 2 diabetes mellitus with diabetic chronic kidney disease: Secondary | ICD-10-CM | POA: Diagnosis not present

## 2021-03-04 DIAGNOSIS — Z96642 Presence of left artificial hip joint: Secondary | ICD-10-CM | POA: Diagnosis not present

## 2021-03-04 DIAGNOSIS — M159 Polyosteoarthritis, unspecified: Secondary | ICD-10-CM | POA: Diagnosis not present

## 2021-03-04 DIAGNOSIS — Z86718 Personal history of other venous thrombosis and embolism: Secondary | ICD-10-CM | POA: Diagnosis not present

## 2021-03-04 DIAGNOSIS — N183 Chronic kidney disease, stage 3 unspecified: Secondary | ICD-10-CM | POA: Diagnosis not present

## 2021-03-05 ENCOUNTER — Encounter (INDEPENDENT_AMBULATORY_CARE_PROVIDER_SITE_OTHER): Payer: Self-pay | Admitting: Vascular Surgery

## 2021-03-05 ENCOUNTER — Ambulatory Visit (INDEPENDENT_AMBULATORY_CARE_PROVIDER_SITE_OTHER): Payer: Medicare Other | Admitting: Vascular Surgery

## 2021-03-05 ENCOUNTER — Other Ambulatory Visit: Payer: Self-pay

## 2021-03-05 VITALS — BP 127/83 | HR 70 | Ht 71.0 in | Wt 319.0 lb

## 2021-03-05 DIAGNOSIS — I890 Lymphedema, not elsewhere classified: Secondary | ICD-10-CM

## 2021-03-05 DIAGNOSIS — I10 Essential (primary) hypertension: Secondary | ICD-10-CM

## 2021-03-05 DIAGNOSIS — N183 Chronic kidney disease, stage 3 unspecified: Secondary | ICD-10-CM

## 2021-03-05 DIAGNOSIS — E1122 Type 2 diabetes mellitus with diabetic chronic kidney disease: Secondary | ICD-10-CM | POA: Diagnosis not present

## 2021-03-05 DIAGNOSIS — I87099 Postthrombotic syndrome with other complications of unspecified lower extremity: Secondary | ICD-10-CM

## 2021-03-05 NOTE — Assessment & Plan Note (Signed)
Worsens lower extremity swelling. 

## 2021-03-05 NOTE — Assessment & Plan Note (Signed)
blood pressure control important in reducing the progression of atherosclerotic disease. On appropriate oral medications.  

## 2021-03-05 NOTE — Assessment & Plan Note (Addendum)
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  I will repeat her venous duplex as it has been about a year since her last study, but suspect she has chronic deep venous insufficiency that would not benefit from any intervention.

## 2021-03-05 NOTE — Progress Notes (Signed)
MRN : [id number]  Becky Trevino is a 75 y.o. ([birth year]) female who presents with chief complaint of  Chief Complaint  Patient presents with   Follow-up    Lymphedema  last seen by GS referred by [facility]  .  History of Present Illness: Patient returns today in follow up of her leg swelling and lymphedema on referral from her orthopedic surgeon.  Recently, she has been doing physical therapy but she has not been walking much for many months.  She has no open wounds and has been wearing the Velcro compression system on the lower legs, but is now developing a large amount of swelling in her medial thighs bilaterally.  The left leg is a little worse than the right leg.  She has a previous history of DVT in the right leg and her last ultrasound was about a year ago.  Current Outpatient Medications  Medication Sig Dispense Refill   acetaminophen (TYLENOL) 650 MG CR tablet Take 650-1,300 mg by mouth 2 (two) times daily as needed for pain.     ASPIRIN LOW DOSE 81 MG EC tablet TAKE 1 TABLET BY MOUTH EVERY DAY 90 tablet 0   atorvastatin (LIPITOR) 40 MG tablet Take 1 tablet (40 mg total) by mouth every evening. 90 tablet 1   Calcium Carbonate-Vitamin D 600-400 MG-UNIT tablet Take 1 tablet by mouth 2 (two) times daily. 60 tablet 0   doxycycline (VIBRA-TABS) 100 MG tablet Take 1 tablet by mouth in the morning and at bedtime.     doxycycline (VIBRAMYCIN) 100 MG capsule Take 100 mg by mouth 2 (two) times daily.     fluticasone (FLONASE) 50 MCG/ACT nasal spray Place 2 sprays into both nostrils daily. 48 mL 0   ketoconazole (NIZORAL) 2 % cream Apply 1 application topically 2 (two) times daily as needed (rash).      latanoprost (XALATAN) 0.005 % ophthalmic solution SMARTSIG:In Eye(s)     Magnesium Oxide 500 MG CAPS Take 500 mg by mouth daily.      metoprolol succinate (TOPROL-XL) 25 MG 24 hr tablet Take 1 tablet (25 mg total) by mouth daily. 45 tablet 0   mirabegron ER (MYRBETRIQ) 50 MG TB24 tablet Take 1  tablet (50 mg total) by mouth daily. 90 tablet 3   oxyCODONE (OXY IR/ROXICODONE) 5 MG immediate release tablet Take 5 mg by mouth every 4 (four) hours as needed.     polyethylene glycol (MIRALAX / GLYCOLAX) 17 g packet Take 17 g by mouth daily. 14 each 0   potassium chloride (KLOR-CON) 10 MEQ tablet Take 10 mEq by mouth daily.     SIMBRINZA 1-0.2 % SUSP Place 1 drop into both eyes 2 (two) times daily.     torsemide (DEMADEX) 20 MG tablet Take 1 tablet (20 mg total) by mouth daily. 90 tablet 1   traZODone (DESYREL) 50 MG tablet Take 0.5-1 tablets (25-50 mg total) by mouth at bedtime as needed for sleep. 90 tablet 1   levothyroxine (SYNTHROID) 25 MCG tablet TAKE 1 TABLET (25 MCG TOTAL) BY MOUTH DAILY AT 6 (SIX) AM. 90 tablet 0   No current facility-administered medications for this visit.    Past Medical History:  Diagnosis Date   Cervical spondylosis    Chronic kidney disease    Stage 3   DDD (degenerative disc disease), cervical    [facility]   Diabetes mellitus without complication (HCC)    Diverticulosis    Hyperlipidemia    Hypertension  Intertrigo    Leg cramps    Leg varices    Obesity    OSA (obstructive sleep apnea)    Symptomatic menopausal or female climacteric states    Syncope     Past Surgical History:  Procedure Laterality Date   ABDOMINAL HYSTERECTOMY  1993   Total   BUNIONECTOMY Bilateral 1993   I & D KNEE WITH POLY EXCHANGE Right 11/19/2017   Procedure: RIGHT KNEE POLY EXCHANGE WITH IRRIGATION AND DEBRIDEMENT;  Surgeon: Hessie Knows, MD;  Location: [facility];  Service: Orthopedics;  Laterality: Right;   JOINT REPLACEMENT     bilateral knee   LAMINECTOMY  11/14/2013    Cervical Fusion , [facility], Dr. Delilah Shan   LASER ABLATION Bilateral 07/29/2012   Dr. Lucky Cowboy   LOWER EXTREMITY ANGIOGRAPHY Right 11/02/2019   Procedure: Lower Extremity Angiography;  Surgeon: Katha Cabal, MD;  Location: [place] [facility];  Service: Cardiovascular;  Laterality: Right;    ORIF ANKLE FRACTURE Right 09/02/2019   Procedure: OPEN REDUCTION INTERNAL FIXATION (ORIF) ANKLE FRACTURE BIMALLEOLAR;  Surgeon: [name], DPM;  Location: [facility];  Service: Podiatry;  Laterality: Right;   PERIPHERAL VASCULAR THROMBECTOMY Right 03/02/2020   Procedure: PERIPHERAL VASCULAR THROMBECTOMY;  Surgeon: Algernon Huxley, MD;  Location: [place] [facility];  Service: Cardiovascular;  Laterality: Right;   REPLACEMENT TOTAL KNEE Left 03/2010   Dr. Rudene Christians   TOTAL HIP ARTHROPLASTY Left 12/11/2015   Procedure: TOTAL HIP ARTHROPLASTY ANTERIOR APPROACH;  Surgeon: Hessie Knows, MD;  Location: [facility];  Service: Orthopedics;  Laterality: Left;   TOTAL KNEE ARTHROPLASTY Right 03/11/2016   Procedure: TOTAL KNEE ARTHROPLASTY;  Surgeon: Hessie Knows, MD;  Location: [facility];  Service: Orthopedics;  Laterality: Right;     Social History   Tobacco Use   Smoking status: Never   Smokeless tobacco: Never  Vaping Use   Vaping Use: Never used  Substance Use Topics   Alcohol use: No    Alcohol/week: 0.0 standard drinks   Drug use: No      Family History  Problem Relation Age of Onset   Diabetes Mother    Hyperlipidemia Mother    Hypertension Mother    Diabetes Father    Hyperlipidemia Father    Hypertension Father    Obesity Father    Hypertension Sister    Hyperlipidemia Sister    Hyperlipidemia Sister    Breast cancer Neg Hx      Allergies  Allergen Reactions   Lisinopril Swelling     REVIEW OF SYSTEMS (Negative unless checked)  Constitutional: [] Weight loss  [] Fever  [] Chills Cardiac: [] Chest pain   [] Chest pressure   [] Palpitations   [] Shortness of breath when laying flat   [] Shortness of breath at rest   [] Shortness of breath with exertion. Vascular:  [] Pain in legs with walking   [x] Pain in legs at rest   [] Pain in legs when laying flat   [] Claudication   [] Pain in feet when walking  [] Pain in feet at rest  [] Pain in feet when laying flat   [] History of DVT   [] Phlebitis    [x] Swelling in legs   [] Varicose veins   [] Non-healing ulcers Pulmonary:   [] Uses home oxygen   [] Productive cough   [] Hemoptysis   [] Wheeze  [] COPD   [] Asthma Neurologic:  [] Dizziness  [] Blackouts   [] Seizures   [] History of stroke   [] History of TIA  [] Aphasia   [] Temporary blindness   [] Dysphagia   [] Weakness or numbness in arms   []   Weakness or numbness in legs Musculoskeletal:  [x] Arthritis   [] Joint swelling   [x] Joint pain   [] Low back pain Hematologic:  [] Easy bruising  [] Easy bleeding   [] Hypercoagulable state   [] Anemic   Gastrointestinal:  [] Blood in stool   [] Vomiting blood  [] Gastroesophageal reflux/heartburn   [] Abdominal pain Genitourinary:  [x] Chronic kidney disease   [] Difficult urination  [] Frequent urination  [] Burning with urination   [] Hematuria Skin:  [] Rashes   [] Ulcers   [] Wounds Psychological:  [] History of anxiety   []  History of major depression.  Physical Examination  BP 127/83   Pulse 70   Ht 5\' 11"  (1.803 m)   Wt (!) 319 lb (144.7 kg)   BMI 44.49 kg/m  Gen:  WD/WN, NAD Head: [place]/AT, No temporalis wasting. Ear/Nose/Throat: Hearing grossly intact, nares w/o erythema or drainage Eyes: Conjunctiva clear. Sclera non-icteric Neck: Supple.  Trachea midline Pulmonary:  Good air movement, no use of accessory muscles.  Cardiac: RRR, no JVD Vascular:  Vessel Right Left  Radial Palpable Palpable       Musculoskeletal: M/S 5/5 throughout.  No deformity or atrophy.  Lower leg swelling is only 1+ with the wraps in place.  The thigh swelling is 2-3+ little more pronounced on the left leg and the right leg.  This goes all the way up almost to the groin.  She is in a wheelchair. Neurologic: Sensation grossly intact in extremities.  Symmetrical.  Speech is fluent.  Psychiatric: Judgment intact, Mood & affect appropriate for pt's clinical situation. Dermatologic: No rashes or ulcers noted.  No cellulitis or open wounds.      Labs Recent Results (from the past 2160  hour(s))  Bladder Scan (Post Void Residual) in office     Status: None   Collection Time: 12/27/20 10:24 AM  Result Value Ref Range   Scan Result 32   POCT HgB A1C     Status: None   Collection Time: 02/04/21 10:52 AM  Result Value Ref Range   Hemoglobin A1C 5.3 4.0 - 5.6 %   HbA1c POC (<> result, manual entry)     HbA1c, POC (prediabetic range)     HbA1c, POC (controlled diabetic range)    Lipid panel     Status: None   Collection Time: 02/04/21 11:23 AM  Result Value Ref Range   Cholesterol 157 <200 mg/dL   HDL 73 > OR = 50 mg/dL   Triglycerides 71 <150 mg/dL   LDL Cholesterol (Calc) 69 mg/dL (calc)    Comment: Reference range: <100 . Desirable range <100 mg/dL for primary prevention;   <70 mg/dL for patients with CHD or diabetic patients  with > or = 2 CHD risk factors. Marland Kitchen LDL-C is now calculated using the Martin-Hopkins  calculation, which is a validated novel method providing  better accuracy than the Friedewald equation in the  estimation of LDL-C.  Cresenciano Genre et al. Annamaria Helling. 3086;578(46): 2061-2068  (http://education.[employer].com/faq/FAQ164)    Total CHOL/HDL Ratio 2.2 <5.0 (calc)   Non-HDL Cholesterol (Calc) 84 <130 mg/dL (calc)    Comment: For patients with diabetes plus 1 major ASCVD risk  factor, treating to a non-HDL-C goal of <100 mg/dL  (LDL-C of <70 mg/dL) is considered a therapeutic  option.   COMPLETE METABOLIC PANEL WITH GFR     Status: Abnormal   Collection Time: 02/04/21 11:23 AM  Result Value Ref Range   Glucose, Bld 91 65 - 99 mg/dL    Comment: .  Fasting reference interval .    BUN 18 7 - 25 mg/dL   Creat 1.27 (H) 0.60 - 0.93 mg/dL    Comment: For patients >41 years of age, the reference limit for Creatinine is approximately 13% higher for people identified as African-American. .    GFR, Est Non African American 41 (L) > OR = 60 mL/min/1.2m2   GFR, Est African American 48 (L) > OR = 60 mL/min/1.5m2   BUN/Creatinine Ratio 14  6 - 22 (calc)   Sodium 139 135 - 146 mmol/L   Potassium 4.5 3.5 - 5.3 mmol/L   Chloride 103 98 - 110 mmol/L   CO2 28 20 - 32 mmol/L   Calcium 9.8 8.6 - 10.4 mg/dL   Total Protein 8.4 (H) 6.1 - 8.1 g/dL   Albumin 4.1 3.6 - 5.1 g/dL   Globulin 4.3 (H) 1.9 - 3.7 g/dL (calc)   AG Ratio 1.0 1.0 - 2.5 (calc)   Total Bilirubin 0.5 0.2 - 1.2 mg/dL   Alkaline phosphatase (APISO) 99 37 - 153 U/L   AST 19 10 - 35 U/L   ALT 14 6 - 29 U/L  CBC with Differential/Platelet     Status: Abnormal   Collection Time: 02/04/21 11:23 AM  Result Value Ref Range   WBC 5.3 3.8 - 10.8 Thousand/uL   RBC 3.94 3.80 - 5.10 Million/uL   Hemoglobin 12.5 11.7 - 15.5 g/dL   HCT 38.1 35.0 - 45.0 %   MCV 96.7 80.0 - 100.0 fL   MCH 31.7 27.0 - 33.0 pg   MCHC 32.8 32.0 - 36.0 g/dL   RDW 14.4 11.0 - 15.0 %   Platelets 139 (L) 140 - 400 Thousand/uL   MPV 10.7 7.5 - 12.5 fL   Neutro Abs 3,668 1,500 - 7,800 cells/uL   Lymphs Abs 1,071 850 - 3,900 cells/uL   Absolute Monocytes 382 200 - 950 cells/uL   Eosinophils Absolute 159 15 - 500 cells/uL   Basophils Absolute 21 0 - 200 cells/uL   Neutrophils Relative % 69.2 %   Total Lymphocyte 20.2 %   Monocytes Relative 7.2 %   Eosinophils Relative 3.0 %   Basophils Relative 0.4 %  Vitamin B12     Status: None   Collection Time: 02/04/21 11:23 AM  Result Value Ref Range   Vitamin B-12 608 200 - 1,100 pg/mL  Protein electrophoresis, serum     Status: Abnormal   Collection Time: 02/04/21 11:23 AM  Result Value Ref Range   Total Protein 8.3 (H) 6.1 - 8.1 g/dL   Albumin ELP 4.0 3.8 - 4.8 g/dL   Alpha 1 0.4 (H) 0.2 - 0.3 g/dL   Alpha 2 0.8 0.5 - 0.9 g/dL   Beta Globulin 0.5 0.4 - 0.6 g/dL   Beta 2 0.5 0.2 - 0.5 g/dL   Gamma Globulin 2.1 (H) 0.8 - 1.7 g/dL   Abnormal Protein Band1 NOTE NONE DETECTED g/dL   SPE Interp.      Comment: . A poorly-defined band of restricted protein mobility is detected in the gamma globulins. It is unlikely that this may represent  a monoclonal protein; however, immunofixation analysis is available if clinically indicated. .   TEST AUTHORIZATION     Status: None   Collection Time: 02/04/21 11:23 AM  Result Value Ref Range   TEST NAME: PROTEIN, TOTAL AND PROTEIN EL    TEST CODE: [id number]    CLIENT CONTACT: LESLIE SMTH    REPORT ALWAYS MESSAGE SIGNATURE  Comment: . The laboratory testing on this patient was verbally requested or confirmed by the ordering physician or his or her authorized representative after contact with an employee of [employer]. Federal regulations require that we maintain on file written authorization for all laboratory testing.  Accordingly we are asking that the ordering physician or his or her authorized representative sign a copy of this report and promptly return it to the client service representative. . . Signature:____________________________________________________ . Please fax this signed page to [phone] or return it via your [employer] courier.   Microalbumin / creatinine urine ratio     Status: None   Collection Time: 02/13/21  8:56 AM  Result Value Ref Range   Creatinine, Urine 130 20 - 275 mg/dL   Microalb, Ur 0.6 mg/dL    Comment: Reference Range Not established    Microalb Creat Ratio 5 <30 mcg/mg creat    Comment: . The ADA defines abnormalities in albumin excretion as follows: Marland Kitchen Albuminuria Category        Result (mcg/mg creatinine) . Normal to Mildly increased   <30 Moderately increased         30-299  Severely increased           > OR = 300 . The ADA recommends that at least two of three specimens collected within a 3-6 month period be abnormal before considering a patient to be within a diagnostic category.   CBC with Differential     Status: Abnormal   Collection Time: 02/23/21 10:49 AM  Result Value Ref Range   WBC 4.9 4.0 - 10.5 K/uL   RBC 3.53 (L) 3.87 - 5.11 MIL/uL   Hemoglobin 11.1 (L) 12.0 - 15.0 g/dL   HCT 34.8 (L)  36.0 - 46.0 %   MCV 98.6 80.0 - 100.0 fL   MCH 31.4 26.0 - 34.0 pg   MCHC 31.9 30.0 - 36.0 g/dL   RDW 15.5 11.5 - 15.5 %   Platelets 146 (L) 150 - 400 K/uL   nRBC 0.0 0.0 - 0.2 %   Neutrophils Relative % 75 %   Neutro Abs 3.7 1.7 - 7.7 K/uL   Lymphocytes Relative 16 %   Lymphs Abs 0.8 0.7 - 4.0 K/uL   Monocytes Relative 6 %   Monocytes Absolute 0.3 0.1 - 1.0 K/uL   Eosinophils Relative 2 %   Eosinophils Absolute 0.1 0.0 - 0.5 K/uL   Basophils Relative 1 %   Basophils Absolute 0.0 0.0 - 0.1 K/uL   Immature Granulocytes 0 %   Abs Immature Granulocytes 0.00 0.00 - 0.07 K/uL    Comment: Performed at [facility], [address] [place], [place] [address]  Comprehensive metabolic panel     Status: Abnormal   Collection Time: 02/23/21 10:49 AM  Result Value Ref Range   Sodium 138 135 - 145 mmol/L   Potassium 4.2 3.5 - 5.1 mmol/L   Chloride 105 98 - 111 mmol/L   CO2 26 22 - 32 mmol/L   Glucose, Bld 118 (H) 70 - 99 mg/dL    Comment: Glucose reference range applies only to samples taken after fasting for at least 8 hours.   BUN 20 8 - 23 mg/dL   Creatinine, Ser 1.30 (H) 0.44 - 1.00 mg/dL   Calcium 9.2 8.9 - 10.3 mg/dL   Total Protein 7.8 6.5 - 8.1 g/dL   Albumin 3.7 3.5 - 5.0 g/dL   AST 27 15 - 41 U/L   ALT 18 0 -  44 U/L   Alkaline Phosphatase 91 38 - 126 U/L   Total Bilirubin 0.8 0.3 - 1.2 mg/dL   GFR, Estimated 43 (L) >60 mL/min    Comment: (NOTE) Calculated using the CKD-EPI Creatinine Equation (2021)    Anion gap 7 5 - 15    Comment: Performed at [facility], [address] [place], [place] [address]  Magnesium     Status: None   Collection Time: 02/23/21 10:49 AM  Result Value Ref Range   Magnesium 2.3 1.7 - 2.4 mg/dL    Comment: Performed at [facility], [address] [place], [place] [address]  TSH     Status: None   Collection Time: 02/23/21 10:49 AM  Result Value Ref Range   TSH 1.922 0.350 - 4.500 uIU/mL    Comment: Performed  by a 3rd Generation assay with a functional sensitivity of <=0.01 uIU/mL. Performed at [facility], [address] [place], [place] [address]   T4, free     Status: None   Collection Time: 02/23/21 10:49 AM  Result Value Ref Range   Free T4 0.85 0.61 - 1.12 ng/dL    Comment: (NOTE) Biotin ingestion may interfere with free T4 tests. If the results are inconsistent with the TSH level, previous test results, or the clinical presentation, then consider biotin interference. If needed, order repeat testing after stopping biotin. Performed at [facility], [address] [place], [place] [address]   D-dimer, quantitative     Status: Abnormal   Collection Time: 02/23/21 10:49 AM  Result Value Ref Range   D-Dimer, Quant 10.46 (H) 0.00 - 0.50 ug/mL-FEU    Comment: (NOTE) At the manufacturer cut-off value of 0.5 g/mL FEU, this assay has a negative predictive value of 95-100%.This assay is intended for use in conjunction with a clinical pretest probability (PTP) assessment model to exclude pulmonary embolism (PE) and deep venous thrombosis (DVT) in outpatients suspected of PE or DVT. Results should be correlated with clinical presentation. Performed at [facility], [place], [place] [address]   Troponin I (High Sensitivity)     Status: None   Collection Time: 02/23/21 10:49 AM  Result Value Ref Range   Troponin I (High Sensitivity) 14 <18 ng/L    Comment: (NOTE) Elevated high sensitivity troponin I (hsTnI) values and significant  changes across serial measurements may suggest ACS but many other  chronic and acute conditions are known to elevate hsTnI results.  Refer to the Links section for chest pain algorithms and additional  guidance. Performed at [facility], [address] [place], [place] [address]   Brain natriuretic peptide     Status: None   Collection Time: 02/23/21 10:49 AM  Result Value Ref Range   B  Natriuretic Peptide 61.0 0.0 - 100.0 pg/mL    Comment: Performed at [facility], [address] [place], [place] [address]  Resp Panel by RT-PCR (Flu A&B, Covid) Nasopharyngeal Swab     Status: None   Collection Time: 02/23/21 10:49 AM   Specimen: Nasopharyngeal Swab; Nasopharyngeal(NP) swabs in vial transport medium  Result Value Ref Range   SARS Coronavirus 2 by RT PCR NEGATIVE NEGATIVE    Comment: (NOTE) SARS-CoV-2 target nucleic acids are NOT DETECTED.  The SARS-CoV-2 RNA is generally detectable in upper respiratory specimens during the acute phase of infection. The lowest concentration of SARS-CoV-2 viral copies this assay can detect is 138 copies/mL. A negative result does not preclude  SARS-Cov-2 infection and should not be used as the sole basis for treatment or other patient management decisions. A negative result may occur with  improper specimen collection/handling, submission of specimen other than nasopharyngeal swab, presence of viral mutation(s) within the areas targeted by this assay, and inadequate number of viral copies(<138 copies/mL). A negative result must be combined with clinical observations, patient history, and epidemiological information. The expected result is Negative.  Fact Sheet for Patients:  EntrepreneurPulse.com.au  Fact Sheet for Healthcare Providers:  IncredibleEmployment.be  This test is no t yet approved or cleared by the [place] FDA and  has been authorized for detection and/or diagnosis of SARS-CoV-2 by FDA under an Emergency Use Authorization (EUA). This EUA will remain  in effect (meaning this test can be used) for the duration of the COVID-19 declaration under Section 564(b)(1) of the Act, 21 U.S.C.section 360bbb-3(b)(1), unless the authorization is terminated  or revoked sooner.       Influenza A by PCR NEGATIVE NEGATIVE   Influenza B by PCR NEGATIVE NEGATIVE    Comment:  (NOTE) The Xpert Xpress SARS-CoV-2/FLU/RSV plus assay is intended as an aid in the diagnosis of influenza from Nasopharyngeal swab specimens and should not be used as a sole basis for treatment. Nasal washings and aspirates are unacceptable for Xpert Xpress SARS-CoV-2/FLU/RSV testing.  Fact Sheet for Patients: EntrepreneurPulse.com.au  Fact Sheet for Healthcare Providers: IncredibleEmployment.be  This test is not yet approved or cleared by the [place] FDA and has been authorized for detection and/or diagnosis of SARS-CoV-2 by FDA under an Emergency Use Authorization (EUA). This EUA will remain in effect (meaning this test can be used) for the duration of the COVID-19 declaration under Section 564(b)(1) of the Act, 21 U.S.C. section 360bbb-3(b)(1), unless the authorization is terminated or revoked.  Performed at [facility], [place], [place] [address]   Troponin I (High Sensitivity)     Status: Abnormal   Collection Time: 02/23/21  1:10 PM  Result Value Ref Range   Troponin I (High Sensitivity) 33 (H) <18 ng/L    Comment: READ BACK AND VERIFIED WITH STEPHANIE RUDD ON 02/23/21 AT 1351 QSD (NOTE) Elevated high sensitivity troponin I (hsTnI) values and significant  changes across serial measurements may suggest ACS but many other  chronic and acute conditions are known to elevate hsTnI results.  Refer to the "Links" section for chest pain algorithms and additional  guidance. Performed at [facility], [address] [place], [place] [address]     Radiology CT Angio Chest PE W and/or Wo Contrast  Result Date: 02/23/2021 CLINICAL DATA:  Chest pain and hypertension. EXAM: CT ANGIOGRAPHY CHEST WITH CONTRAST TECHNIQUE: Multidetector CT imaging of the chest was performed using the standard protocol during bolus administration of intravenous contrast. Multiplanar CT image reconstructions and MIPs were  obtained to evaluate the vascular anatomy. CONTRAST:  37mL OMNIPAQUE IOHEXOL 350 MG/ML SOLN COMPARISON:  None. FINDINGS: Cardiovascular: The heart is within normal limits in size. No pericardial effusion. There is mild tortuosity, ectasia and calcification of the thoracic aorta. No dissection. The branch vessels are patent. Scattered coronary artery calcifications. The pulmonary arterial tree is fairly well opacified. No filling defects to suggest pulmonary embolism. Mediastinum/Nodes: No mediastinal or hilar mass or adenopathy. The esophagus is grossly normal. There is a small hiatal hernia noted. Lungs/Pleura: No acute pulmonary findings. There is moderate eventration of the right hemidiaphragm with some overlying vascular crowding and atelectasis. No pleural effusions. No worrisome pulmonary  lesions or pulmonary nodules. No pneumothorax. Upper Abdomen: No significant upper abdominal findings. Few small gallstones are noted. Musculoskeletal: No breast masses, supraclavicular or axillary adenopathy. The bony thorax is intact. Review of the MIP images confirms the above findings. IMPRESSION: 1. No CT findings for pulmonary embolism. 2. Mild tortuosity, ectasia and calcification of the thoracic aorta but no aneurysm or dissection. 3. No acute pulmonary findings. 4. Moderate eventration of the right hemidiaphragm with some overlying vascular crowding and atelectasis. 5. Cholelithiasis. 6. Aortic atherosclerosis. Aortic Atherosclerosis (ICD10-I70.0). Electronically Signed   By: Marijo Sanes M.D.   On: 02/23/2021 16:02   DG Chest Portable 1 View  Result Date: 02/23/2021 CLINICAL DATA:  Shortness of breath EXAM: PORTABLE CHEST 1 VIEW COMPARISON:  09/09/2020 FINDINGS: Low volume chest with borderline heart size distorted by rotation. Aortic tortuosity. There is no edema, consolidation, effusion, or pneumothorax. Severe glenohumeral arthritis on both sides with chronic anterior glenohumeral dislocation on the left  IMPRESSION: Stable from prior.  No acute finding. Electronically Signed   By: Monte Fantasia M.D.   On: 02/23/2021 11:26    Assessment/Plan  Chronic kidney disease (CKD), stage III (moderate) (HCC) Worsens lower extremity swelling  Diabetes mellitus with renal manifestation blood glucose control important in reducing the progression of atherosclerotic disease. Also, involved in wound healing. On appropriate medications.   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  I will repeat her venous duplex as it has been about a year since her last study, but suspect she has chronic deep venous insufficiency that would not benefit from any intervention.  Benign essential HTN blood pressure control important in reducing the progression of atherosclerotic disease. On appropriate oral medications.   Lymphedema The patient has fairly severe lymphedema that would be at least stage II and has been refractory to Velcro compression system and a traditional lymphedema pump.  I am going to repeat a venous duplex to ensure that there are no further venous abnormalities that need to be addressed and no recurrent thrombosis.  As much of her swelling is up in the thigh this and upper legs, I think that a lymphedema pump that includes the lower abdomen pelvis would be a huge benefit for her.  We will try to get that approved in the near future as well.    Leotis Pain, MD  03/05/2021 11:17 AM    This note was created with Dragon medical transcription system.  Any errors from dictation are purely unintentional

## 2021-03-05 NOTE — Assessment & Plan Note (Signed)
The patient has fairly severe lymphedema that would be at least stage II and has been refractory to Velcro compression system and a traditional lymphedema pump.  I am going to repeat a venous duplex to ensure that there are no further venous abnormalities that need to be addressed and no recurrent thrombosis.  As much of her swelling is up in the thigh this and upper legs, I think that a lymphedema pump that includes the lower abdomen pelvis would be a huge benefit for her.  We will try to get that approved in the near future as well.

## 2021-03-05 NOTE — Assessment & Plan Note (Signed)
blood glucose control important in reducing the progression of atherosclerotic disease. Also, involved in wound healing. On appropriate medications.  

## 2021-03-06 DIAGNOSIS — I890 Lymphedema, not elsewhere classified: Secondary | ICD-10-CM | POA: Diagnosis not present

## 2021-03-06 DIAGNOSIS — E1169 Type 2 diabetes mellitus with other specified complication: Secondary | ICD-10-CM | POA: Diagnosis not present

## 2021-03-06 DIAGNOSIS — G588 Other specified mononeuropathies: Secondary | ICD-10-CM | POA: Diagnosis not present

## 2021-03-06 DIAGNOSIS — D631 Anemia in chronic kidney disease: Secondary | ICD-10-CM | POA: Diagnosis not present

## 2021-03-06 DIAGNOSIS — Z86718 Personal history of other venous thrombosis and embolism: Secondary | ICD-10-CM | POA: Diagnosis not present

## 2021-03-06 DIAGNOSIS — E785 Hyperlipidemia, unspecified: Secondary | ICD-10-CM | POA: Diagnosis not present

## 2021-03-06 DIAGNOSIS — E039 Hypothyroidism, unspecified: Secondary | ICD-10-CM | POA: Diagnosis not present

## 2021-03-06 DIAGNOSIS — I129 Hypertensive chronic kidney disease with stage 1 through stage 4 chronic kidney disease, or unspecified chronic kidney disease: Secondary | ICD-10-CM | POA: Diagnosis not present

## 2021-03-06 DIAGNOSIS — E1122 Type 2 diabetes mellitus with diabetic chronic kidney disease: Secondary | ICD-10-CM | POA: Diagnosis not present

## 2021-03-06 DIAGNOSIS — Z9181 History of falling: Secondary | ICD-10-CM | POA: Diagnosis not present

## 2021-03-06 DIAGNOSIS — Z96652 Presence of left artificial knee joint: Secondary | ICD-10-CM | POA: Diagnosis not present

## 2021-03-06 DIAGNOSIS — G4733 Obstructive sleep apnea (adult) (pediatric): Secondary | ICD-10-CM | POA: Diagnosis not present

## 2021-03-06 DIAGNOSIS — M159 Polyosteoarthritis, unspecified: Secondary | ICD-10-CM | POA: Diagnosis not present

## 2021-03-06 DIAGNOSIS — I272 Pulmonary hypertension, unspecified: Secondary | ICD-10-CM | POA: Diagnosis not present

## 2021-03-06 DIAGNOSIS — M5033 Other cervical disc degeneration, cervicothoracic region: Secondary | ICD-10-CM | POA: Diagnosis not present

## 2021-03-06 DIAGNOSIS — Z96642 Presence of left artificial hip joint: Secondary | ICD-10-CM | POA: Diagnosis not present

## 2021-03-06 DIAGNOSIS — E441 Mild protein-calorie malnutrition: Secondary | ICD-10-CM | POA: Diagnosis not present

## 2021-03-06 DIAGNOSIS — E1140 Type 2 diabetes mellitus with diabetic neuropathy, unspecified: Secondary | ICD-10-CM | POA: Diagnosis not present

## 2021-03-06 DIAGNOSIS — N179 Acute kidney failure, unspecified: Secondary | ICD-10-CM | POA: Diagnosis not present

## 2021-03-06 DIAGNOSIS — N183 Chronic kidney disease, stage 3 unspecified: Secondary | ICD-10-CM | POA: Diagnosis not present

## 2021-03-06 DIAGNOSIS — E538 Deficiency of other specified B group vitamins: Secondary | ICD-10-CM | POA: Diagnosis not present

## 2021-03-08 DIAGNOSIS — I480 Paroxysmal atrial fibrillation: Secondary | ICD-10-CM | POA: Diagnosis not present

## 2021-03-08 DIAGNOSIS — I272 Pulmonary hypertension, unspecified: Secondary | ICD-10-CM | POA: Diagnosis not present

## 2021-03-08 DIAGNOSIS — I700 Atherosclerosis of aorta: Secondary | ICD-10-CM | POA: Diagnosis not present

## 2021-03-08 DIAGNOSIS — I10 Essential (primary) hypertension: Secondary | ICD-10-CM | POA: Diagnosis not present

## 2021-03-08 DIAGNOSIS — G4730 Sleep apnea, unspecified: Secondary | ICD-10-CM | POA: Diagnosis not present

## 2021-03-08 DIAGNOSIS — E782 Mixed hyperlipidemia: Secondary | ICD-10-CM | POA: Diagnosis not present

## 2021-03-08 DIAGNOSIS — N183 Chronic kidney disease, stage 3 unspecified: Secondary | ICD-10-CM | POA: Diagnosis not present

## 2021-03-08 DIAGNOSIS — E119 Type 2 diabetes mellitus without complications: Secondary | ICD-10-CM | POA: Diagnosis not present

## 2021-03-08 DIAGNOSIS — R0600 Dyspnea, unspecified: Secondary | ICD-10-CM | POA: Diagnosis not present

## 2021-03-08 DIAGNOSIS — R600 Localized edema: Secondary | ICD-10-CM | POA: Diagnosis not present

## 2021-03-08 DIAGNOSIS — I4891 Unspecified atrial fibrillation: Secondary | ICD-10-CM | POA: Diagnosis not present

## 2021-03-08 DIAGNOSIS — I872 Venous insufficiency (chronic) (peripheral): Secondary | ICD-10-CM | POA: Diagnosis not present

## 2021-03-11 DIAGNOSIS — B351 Tinea unguium: Secondary | ICD-10-CM | POA: Diagnosis not present

## 2021-03-11 DIAGNOSIS — E1142 Type 2 diabetes mellitus with diabetic polyneuropathy: Secondary | ICD-10-CM | POA: Diagnosis not present

## 2021-03-12 DIAGNOSIS — G588 Other specified mononeuropathies: Secondary | ICD-10-CM | POA: Diagnosis not present

## 2021-03-12 DIAGNOSIS — M5033 Other cervical disc degeneration, cervicothoracic region: Secondary | ICD-10-CM | POA: Diagnosis not present

## 2021-03-12 DIAGNOSIS — E1122 Type 2 diabetes mellitus with diabetic chronic kidney disease: Secondary | ICD-10-CM | POA: Diagnosis not present

## 2021-03-12 DIAGNOSIS — Z96642 Presence of left artificial hip joint: Secondary | ICD-10-CM | POA: Diagnosis not present

## 2021-03-12 DIAGNOSIS — Z9181 History of falling: Secondary | ICD-10-CM | POA: Diagnosis not present

## 2021-03-12 DIAGNOSIS — N183 Chronic kidney disease, stage 3 unspecified: Secondary | ICD-10-CM | POA: Diagnosis not present

## 2021-03-12 DIAGNOSIS — I129 Hypertensive chronic kidney disease with stage 1 through stage 4 chronic kidney disease, or unspecified chronic kidney disease: Secondary | ICD-10-CM | POA: Diagnosis not present

## 2021-03-12 DIAGNOSIS — N179 Acute kidney failure, unspecified: Secondary | ICD-10-CM | POA: Diagnosis not present

## 2021-03-12 DIAGNOSIS — I890 Lymphedema, not elsewhere classified: Secondary | ICD-10-CM | POA: Diagnosis not present

## 2021-03-12 DIAGNOSIS — E039 Hypothyroidism, unspecified: Secondary | ICD-10-CM | POA: Diagnosis not present

## 2021-03-12 DIAGNOSIS — E538 Deficiency of other specified B group vitamins: Secondary | ICD-10-CM | POA: Diagnosis not present

## 2021-03-12 DIAGNOSIS — Z96652 Presence of left artificial knee joint: Secondary | ICD-10-CM | POA: Diagnosis not present

## 2021-03-12 DIAGNOSIS — Z86718 Personal history of other venous thrombosis and embolism: Secondary | ICD-10-CM | POA: Diagnosis not present

## 2021-03-12 DIAGNOSIS — E1140 Type 2 diabetes mellitus with diabetic neuropathy, unspecified: Secondary | ICD-10-CM | POA: Diagnosis not present

## 2021-03-12 DIAGNOSIS — I272 Pulmonary hypertension, unspecified: Secondary | ICD-10-CM | POA: Diagnosis not present

## 2021-03-12 DIAGNOSIS — E785 Hyperlipidemia, unspecified: Secondary | ICD-10-CM | POA: Diagnosis not present

## 2021-03-12 DIAGNOSIS — G4733 Obstructive sleep apnea (adult) (pediatric): Secondary | ICD-10-CM | POA: Diagnosis not present

## 2021-03-12 DIAGNOSIS — E1169 Type 2 diabetes mellitus with other specified complication: Secondary | ICD-10-CM | POA: Diagnosis not present

## 2021-03-12 DIAGNOSIS — D631 Anemia in chronic kidney disease: Secondary | ICD-10-CM | POA: Diagnosis not present

## 2021-03-12 DIAGNOSIS — E441 Mild protein-calorie malnutrition: Secondary | ICD-10-CM | POA: Diagnosis not present

## 2021-03-12 DIAGNOSIS — M159 Polyosteoarthritis, unspecified: Secondary | ICD-10-CM | POA: Diagnosis not present

## 2021-03-13 DIAGNOSIS — N1831 Chronic kidney disease, stage 3a: Secondary | ICD-10-CM | POA: Diagnosis not present

## 2021-03-13 DIAGNOSIS — E039 Hypothyroidism, unspecified: Secondary | ICD-10-CM | POA: Diagnosis not present

## 2021-03-13 DIAGNOSIS — E785 Hyperlipidemia, unspecified: Secondary | ICD-10-CM | POA: Diagnosis not present

## 2021-03-13 DIAGNOSIS — E1122 Type 2 diabetes mellitus with diabetic chronic kidney disease: Secondary | ICD-10-CM | POA: Diagnosis not present

## 2021-03-13 DIAGNOSIS — M5033 Other cervical disc degeneration, cervicothoracic region: Secondary | ICD-10-CM | POA: Diagnosis not present

## 2021-03-13 DIAGNOSIS — M1990 Unspecified osteoarthritis, unspecified site: Secondary | ICD-10-CM | POA: Diagnosis not present

## 2021-03-13 DIAGNOSIS — I272 Pulmonary hypertension, unspecified: Secondary | ICD-10-CM | POA: Diagnosis not present

## 2021-03-13 DIAGNOSIS — E538 Deficiency of other specified B group vitamins: Secondary | ICD-10-CM | POA: Diagnosis not present

## 2021-03-13 DIAGNOSIS — N179 Acute kidney failure, unspecified: Secondary | ICD-10-CM | POA: Diagnosis not present

## 2021-03-13 DIAGNOSIS — E1169 Type 2 diabetes mellitus with other specified complication: Secondary | ICD-10-CM | POA: Diagnosis not present

## 2021-03-13 DIAGNOSIS — Z96652 Presence of left artificial knee joint: Secondary | ICD-10-CM | POA: Diagnosis not present

## 2021-03-13 DIAGNOSIS — D631 Anemia in chronic kidney disease: Secondary | ICD-10-CM | POA: Diagnosis not present

## 2021-03-13 DIAGNOSIS — G4733 Obstructive sleep apnea (adult) (pediatric): Secondary | ICD-10-CM | POA: Diagnosis not present

## 2021-03-13 DIAGNOSIS — I890 Lymphedema, not elsewhere classified: Secondary | ICD-10-CM | POA: Diagnosis not present

## 2021-03-13 DIAGNOSIS — M159 Polyosteoarthritis, unspecified: Secondary | ICD-10-CM | POA: Diagnosis not present

## 2021-03-13 DIAGNOSIS — Z86718 Personal history of other venous thrombosis and embolism: Secondary | ICD-10-CM | POA: Diagnosis not present

## 2021-03-13 DIAGNOSIS — E1140 Type 2 diabetes mellitus with diabetic neuropathy, unspecified: Secondary | ICD-10-CM | POA: Diagnosis not present

## 2021-03-13 DIAGNOSIS — M21372 Foot drop, left foot: Secondary | ICD-10-CM | POA: Diagnosis not present

## 2021-03-13 DIAGNOSIS — N183 Chronic kidney disease, stage 3 unspecified: Secondary | ICD-10-CM | POA: Diagnosis not present

## 2021-03-13 DIAGNOSIS — I129 Hypertensive chronic kidney disease with stage 1 through stage 4 chronic kidney disease, or unspecified chronic kidney disease: Secondary | ICD-10-CM | POA: Diagnosis not present

## 2021-03-13 DIAGNOSIS — G588 Other specified mononeuropathies: Secondary | ICD-10-CM | POA: Diagnosis not present

## 2021-03-13 DIAGNOSIS — Z9181 History of falling: Secondary | ICD-10-CM | POA: Diagnosis not present

## 2021-03-13 DIAGNOSIS — Z96642 Presence of left artificial hip joint: Secondary | ICD-10-CM | POA: Diagnosis not present

## 2021-03-13 DIAGNOSIS — E441 Mild protein-calorie malnutrition: Secondary | ICD-10-CM | POA: Diagnosis not present

## 2021-03-20 DIAGNOSIS — I272 Pulmonary hypertension, unspecified: Secondary | ICD-10-CM | POA: Diagnosis not present

## 2021-03-20 DIAGNOSIS — Z9181 History of falling: Secondary | ICD-10-CM | POA: Diagnosis not present

## 2021-03-20 DIAGNOSIS — I129 Hypertensive chronic kidney disease with stage 1 through stage 4 chronic kidney disease, or unspecified chronic kidney disease: Secondary | ICD-10-CM | POA: Diagnosis not present

## 2021-03-20 DIAGNOSIS — I890 Lymphedema, not elsewhere classified: Secondary | ICD-10-CM | POA: Diagnosis not present

## 2021-03-20 DIAGNOSIS — E538 Deficiency of other specified B group vitamins: Secondary | ICD-10-CM | POA: Diagnosis not present

## 2021-03-20 DIAGNOSIS — M5033 Other cervical disc degeneration, cervicothoracic region: Secondary | ICD-10-CM | POA: Diagnosis not present

## 2021-03-20 DIAGNOSIS — E441 Mild protein-calorie malnutrition: Secondary | ICD-10-CM | POA: Diagnosis not present

## 2021-03-20 DIAGNOSIS — D631 Anemia in chronic kidney disease: Secondary | ICD-10-CM | POA: Diagnosis not present

## 2021-03-20 DIAGNOSIS — Z86718 Personal history of other venous thrombosis and embolism: Secondary | ICD-10-CM | POA: Diagnosis not present

## 2021-03-20 DIAGNOSIS — E1122 Type 2 diabetes mellitus with diabetic chronic kidney disease: Secondary | ICD-10-CM | POA: Diagnosis not present

## 2021-03-20 DIAGNOSIS — M159 Polyosteoarthritis, unspecified: Secondary | ICD-10-CM | POA: Diagnosis not present

## 2021-03-20 DIAGNOSIS — N179 Acute kidney failure, unspecified: Secondary | ICD-10-CM | POA: Diagnosis not present

## 2021-03-20 DIAGNOSIS — E1169 Type 2 diabetes mellitus with other specified complication: Secondary | ICD-10-CM | POA: Diagnosis not present

## 2021-03-20 DIAGNOSIS — E1140 Type 2 diabetes mellitus with diabetic neuropathy, unspecified: Secondary | ICD-10-CM | POA: Diagnosis not present

## 2021-03-20 DIAGNOSIS — E785 Hyperlipidemia, unspecified: Secondary | ICD-10-CM | POA: Diagnosis not present

## 2021-03-20 DIAGNOSIS — G588 Other specified mononeuropathies: Secondary | ICD-10-CM | POA: Diagnosis not present

## 2021-03-20 DIAGNOSIS — Z96642 Presence of left artificial hip joint: Secondary | ICD-10-CM | POA: Diagnosis not present

## 2021-03-20 DIAGNOSIS — E039 Hypothyroidism, unspecified: Secondary | ICD-10-CM | POA: Diagnosis not present

## 2021-03-20 DIAGNOSIS — Z96652 Presence of left artificial knee joint: Secondary | ICD-10-CM | POA: Diagnosis not present

## 2021-03-20 DIAGNOSIS — N183 Chronic kidney disease, stage 3 unspecified: Secondary | ICD-10-CM | POA: Diagnosis not present

## 2021-03-20 DIAGNOSIS — G4733 Obstructive sleep apnea (adult) (pediatric): Secondary | ICD-10-CM | POA: Diagnosis not present

## 2021-03-21 DIAGNOSIS — L03115 Cellulitis of right lower limb: Secondary | ICD-10-CM | POA: Diagnosis not present

## 2021-03-22 DIAGNOSIS — Z0100 Encounter for examination of eyes and vision without abnormal findings: Secondary | ICD-10-CM | POA: Diagnosis not present

## 2021-03-22 DIAGNOSIS — E119 Type 2 diabetes mellitus without complications: Secondary | ICD-10-CM | POA: Diagnosis not present

## 2021-03-22 LAB — HM DIABETES EYE EXAM

## 2021-03-26 DIAGNOSIS — R2681 Unsteadiness on feet: Secondary | ICD-10-CM | POA: Diagnosis not present

## 2021-03-26 DIAGNOSIS — L03115 Cellulitis of right lower limb: Secondary | ICD-10-CM | POA: Diagnosis not present

## 2021-03-26 DIAGNOSIS — E1140 Type 2 diabetes mellitus with diabetic neuropathy, unspecified: Secondary | ICD-10-CM | POA: Diagnosis not present

## 2021-03-27 DIAGNOSIS — Z96652 Presence of left artificial knee joint: Secondary | ICD-10-CM | POA: Diagnosis not present

## 2021-03-27 DIAGNOSIS — Z96642 Presence of left artificial hip joint: Secondary | ICD-10-CM | POA: Diagnosis not present

## 2021-03-27 DIAGNOSIS — G588 Other specified mononeuropathies: Secondary | ICD-10-CM | POA: Diagnosis not present

## 2021-03-27 DIAGNOSIS — I272 Pulmonary hypertension, unspecified: Secondary | ICD-10-CM | POA: Diagnosis not present

## 2021-03-27 DIAGNOSIS — E1169 Type 2 diabetes mellitus with other specified complication: Secondary | ICD-10-CM | POA: Diagnosis not present

## 2021-03-27 DIAGNOSIS — Z9181 History of falling: Secondary | ICD-10-CM | POA: Diagnosis not present

## 2021-03-27 DIAGNOSIS — Z86718 Personal history of other venous thrombosis and embolism: Secondary | ICD-10-CM | POA: Diagnosis not present

## 2021-03-27 DIAGNOSIS — M159 Polyosteoarthritis, unspecified: Secondary | ICD-10-CM | POA: Diagnosis not present

## 2021-03-27 DIAGNOSIS — E039 Hypothyroidism, unspecified: Secondary | ICD-10-CM | POA: Diagnosis not present

## 2021-03-27 DIAGNOSIS — E1140 Type 2 diabetes mellitus with diabetic neuropathy, unspecified: Secondary | ICD-10-CM | POA: Diagnosis not present

## 2021-03-27 DIAGNOSIS — E785 Hyperlipidemia, unspecified: Secondary | ICD-10-CM | POA: Diagnosis not present

## 2021-03-27 DIAGNOSIS — I890 Lymphedema, not elsewhere classified: Secondary | ICD-10-CM | POA: Diagnosis not present

## 2021-03-27 DIAGNOSIS — E441 Mild protein-calorie malnutrition: Secondary | ICD-10-CM | POA: Diagnosis not present

## 2021-03-27 DIAGNOSIS — I129 Hypertensive chronic kidney disease with stage 1 through stage 4 chronic kidney disease, or unspecified chronic kidney disease: Secondary | ICD-10-CM | POA: Diagnosis not present

## 2021-03-27 DIAGNOSIS — E1122 Type 2 diabetes mellitus with diabetic chronic kidney disease: Secondary | ICD-10-CM | POA: Diagnosis not present

## 2021-03-27 DIAGNOSIS — G4733 Obstructive sleep apnea (adult) (pediatric): Secondary | ICD-10-CM | POA: Diagnosis not present

## 2021-03-27 DIAGNOSIS — D631 Anemia in chronic kidney disease: Secondary | ICD-10-CM | POA: Diagnosis not present

## 2021-03-27 DIAGNOSIS — N183 Chronic kidney disease, stage 3 unspecified: Secondary | ICD-10-CM | POA: Diagnosis not present

## 2021-03-27 DIAGNOSIS — M5033 Other cervical disc degeneration, cervicothoracic region: Secondary | ICD-10-CM | POA: Diagnosis not present

## 2021-03-27 DIAGNOSIS — E538 Deficiency of other specified B group vitamins: Secondary | ICD-10-CM | POA: Diagnosis not present

## 2021-03-27 DIAGNOSIS — N179 Acute kidney failure, unspecified: Secondary | ICD-10-CM | POA: Diagnosis not present

## 2021-03-28 DIAGNOSIS — L03115 Cellulitis of right lower limb: Secondary | ICD-10-CM | POA: Diagnosis not present

## 2021-04-01 DIAGNOSIS — E1122 Type 2 diabetes mellitus with diabetic chronic kidney disease: Secondary | ICD-10-CM | POA: Diagnosis not present

## 2021-04-01 DIAGNOSIS — N179 Acute kidney failure, unspecified: Secondary | ICD-10-CM | POA: Diagnosis not present

## 2021-04-01 DIAGNOSIS — E039 Hypothyroidism, unspecified: Secondary | ICD-10-CM | POA: Diagnosis not present

## 2021-04-01 DIAGNOSIS — E538 Deficiency of other specified B group vitamins: Secondary | ICD-10-CM | POA: Diagnosis not present

## 2021-04-01 DIAGNOSIS — E1140 Type 2 diabetes mellitus with diabetic neuropathy, unspecified: Secondary | ICD-10-CM | POA: Diagnosis not present

## 2021-04-01 DIAGNOSIS — M159 Polyosteoarthritis, unspecified: Secondary | ICD-10-CM | POA: Diagnosis not present

## 2021-04-01 DIAGNOSIS — N183 Chronic kidney disease, stage 3 unspecified: Secondary | ICD-10-CM | POA: Diagnosis not present

## 2021-04-01 DIAGNOSIS — Z96652 Presence of left artificial knee joint: Secondary | ICD-10-CM | POA: Diagnosis not present

## 2021-04-01 DIAGNOSIS — I272 Pulmonary hypertension, unspecified: Secondary | ICD-10-CM | POA: Diagnosis not present

## 2021-04-01 DIAGNOSIS — G4733 Obstructive sleep apnea (adult) (pediatric): Secondary | ICD-10-CM | POA: Diagnosis not present

## 2021-04-01 DIAGNOSIS — Z86718 Personal history of other venous thrombosis and embolism: Secondary | ICD-10-CM | POA: Diagnosis not present

## 2021-04-01 DIAGNOSIS — E441 Mild protein-calorie malnutrition: Secondary | ICD-10-CM | POA: Diagnosis not present

## 2021-04-01 DIAGNOSIS — Z9181 History of falling: Secondary | ICD-10-CM | POA: Diagnosis not present

## 2021-04-01 DIAGNOSIS — D631 Anemia in chronic kidney disease: Secondary | ICD-10-CM | POA: Diagnosis not present

## 2021-04-01 DIAGNOSIS — M5033 Other cervical disc degeneration, cervicothoracic region: Secondary | ICD-10-CM | POA: Diagnosis not present

## 2021-04-01 DIAGNOSIS — G588 Other specified mononeuropathies: Secondary | ICD-10-CM | POA: Diagnosis not present

## 2021-04-01 DIAGNOSIS — E1169 Type 2 diabetes mellitus with other specified complication: Secondary | ICD-10-CM | POA: Diagnosis not present

## 2021-04-01 DIAGNOSIS — Z96642 Presence of left artificial hip joint: Secondary | ICD-10-CM | POA: Diagnosis not present

## 2021-04-01 DIAGNOSIS — E785 Hyperlipidemia, unspecified: Secondary | ICD-10-CM | POA: Diagnosis not present

## 2021-04-01 DIAGNOSIS — I129 Hypertensive chronic kidney disease with stage 1 through stage 4 chronic kidney disease, or unspecified chronic kidney disease: Secondary | ICD-10-CM | POA: Diagnosis not present

## 2021-04-01 DIAGNOSIS — I890 Lymphedema, not elsewhere classified: Secondary | ICD-10-CM | POA: Diagnosis not present

## 2021-04-02 ENCOUNTER — Other Ambulatory Visit (INDEPENDENT_AMBULATORY_CARE_PROVIDER_SITE_OTHER): Payer: Self-pay | Admitting: Vascular Surgery

## 2021-04-02 DIAGNOSIS — I890 Lymphedema, not elsewhere classified: Secondary | ICD-10-CM

## 2021-04-02 DIAGNOSIS — I87099 Postthrombotic syndrome with other complications of unspecified lower extremity: Secondary | ICD-10-CM

## 2021-04-03 ENCOUNTER — Ambulatory Visit (INDEPENDENT_AMBULATORY_CARE_PROVIDER_SITE_OTHER): Payer: Medicare Other | Admitting: Nurse Practitioner

## 2021-04-03 ENCOUNTER — Ambulatory Visit (INDEPENDENT_AMBULATORY_CARE_PROVIDER_SITE_OTHER): Payer: Medicare Other

## 2021-04-03 ENCOUNTER — Encounter (INDEPENDENT_AMBULATORY_CARE_PROVIDER_SITE_OTHER): Payer: Self-pay | Admitting: Nurse Practitioner

## 2021-04-03 ENCOUNTER — Other Ambulatory Visit: Payer: Self-pay

## 2021-04-03 VITALS — BP 156/86 | HR 70 | Resp 16

## 2021-04-03 DIAGNOSIS — I890 Lymphedema, not elsewhere classified: Secondary | ICD-10-CM

## 2021-04-03 DIAGNOSIS — N183 Chronic kidney disease, stage 3 unspecified: Secondary | ICD-10-CM | POA: Diagnosis not present

## 2021-04-03 DIAGNOSIS — E1122 Type 2 diabetes mellitus with diabetic chronic kidney disease: Secondary | ICD-10-CM | POA: Diagnosis not present

## 2021-04-03 DIAGNOSIS — I87099 Postthrombotic syndrome with other complications of unspecified lower extremity: Secondary | ICD-10-CM | POA: Diagnosis not present

## 2021-04-03 DIAGNOSIS — I10 Essential (primary) hypertension: Secondary | ICD-10-CM

## 2021-04-03 NOTE — Progress Notes (Addendum)
Subjective:    Patient ID: Becky Trevino, female    DOB: 11-May-1946, 75 y.o.   MRN: [id number] Chief Complaint  Patient presents with   Follow-up    Ultrasound follow up    Becky Trevino is a 75 year old woman that returns today for follow-up evaluation of lower extremity edema.  The patient had been doing physical therapy but had not been walking for many months.  The patient notes that her leg swelling and lymphedema worsen.  She is very diligent about wearing her compression wraps using a Velcro compression system.  This does well for the swelling below her knees.  However she has developed swelling in the medial thighs bilaterally.  The left tends to be worse than the right.  She recently underwent a study today to determine the presence of possible recurrence of DVT or deep venous insufficiency.  It was noted that the patient did have evidence of a chronic DVT in her left lower extremity.  This is consistent with previous studies.  The patient also has evidence of deep venous insufficiency in the right lower extremity in the common femoral vein as well as in the left from the common femoral vein to the popliteal vein.  Patient has some superficial venous reflux in the right and the great saphenous vein at the saphenofemoral junction as well as left in the great saphenous vein at the saphenofemoral junction and mid thigh.  Patient denies any open wounds or ulcerations.  She denies any fevers or chills.   Review of Systems  Cardiovascular:  Positive for leg swelling.  Musculoskeletal:  Positive for gait problem.  All other systems reviewed and are negative.     Objective:   Physical Exam Vitals reviewed.  HENT:     Head: Normocephalic.  Cardiovascular:     Rate and Rhythm: Normal rate.     Pulses: Normal pulses.  Pulmonary:     Effort: Pulmonary effort is normal.  Musculoskeletal:     Right lower leg: 3+ Edema present.     Left lower leg: 3+ Edema present.  Skin:    Comments:  Dermal thickening bilaterally  Neurological:     Mental Status: She is alert and oriented to person, place, and time.     Motor: Weakness present.     Gait: Gait abnormal.  Psychiatric:        Mood and Affect: Mood normal.        Behavior: Behavior normal.        Thought Content: Thought content normal.        Judgment: Judgment normal.    BP (!) 156/86 (BP Location: Right Arm)   Pulse 70   Resp 16   Past Medical History:  Diagnosis Date   Cervical spondylosis    Chronic kidney disease    Stage 3   DDD (degenerative disc disease), cervical    [facility]   Diabetes mellitus without complication (HCC)    Diverticulosis    Hyperlipidemia    Hypertension    Intertrigo    Leg cramps    Leg varices    Obesity    OSA (obstructive sleep apnea)    Symptomatic menopausal or female climacteric states    Syncope     Social History   Socioeconomic History   Marital status: Married    Spouse name: Not on file   Number of children: 1   Years of education: Not on file   Highest education level: Not on file  Occupational History   Not on file  Tobacco Use   Smoking status: Never   Smokeless tobacco: Never  Vaping Use   Vaping Use: Never used  Substance and Sexual Activity   Alcohol use: No    Alcohol/week: 0.0 standard drinks   Drug use: No   Sexual activity: Yes    Partners: Male  Other Topics Concern   Not on file  Social History Narrative   Married, one adopted grown child    Social Determinants of Health   Financial Resource Strain: Low Risk    Difficulty of Paying Living Expenses: Not hard at all  Food Insecurity: No Food Insecurity   Worried About Charity fundraiser in the Last Year: Never true   Arboriculturist in the Last Year: Never true  Transportation Needs: No Transportation Needs   Lack of Transportation (Medical): No   Lack of Transportation (Non-Medical): No  Physical Activity: Inactive   Days of Exercise per Week: 0 days   Minutes of  Exercise per Session: 0 min  Stress: No Stress Concern Present   Feeling of Stress : Not at all  Social Connections: Moderately Integrated   Frequency of Communication with Friends and Family: More than three times a week   Frequency of Social Gatherings with Friends and Family: More than three times a week   Attends Religious Services: More than 4 times per year   Active Member of [employer] or Organizations: No   Attends Archivist Meetings: Never   Marital Status: Married  Human resources officer Violence: Not At Risk   Fear of Current or Ex-Partner: No   Emotionally Abused: No   Physically Abused: No   Sexually Abused: No    Past Surgical History:  Procedure Laterality Date   ABDOMINAL HYSTERECTOMY  1993   Total   BUNIONECTOMY Bilateral [place] Right 11/19/2017   Procedure: RIGHT KNEE POLY [place];  Surgeon: Hessie Knows, MD;  Location: [facility];  Service: Orthopedics;  Laterality: Right;   JOINT REPLACEMENT     bilateral knee   LAMINECTOMY  11/14/2013    Cervical Fusion , [facility], Dr. Delilah Shan   LASER ABLATION Bilateral 07/29/2012   Dr. Lucky Cowboy   LOWER EXTREMITY ANGIOGRAPHY Right 11/02/2019   Procedure: Lower Extremity Angiography;  Surgeon: Katha Cabal, MD;  Location: [place] [facility];  Service: Cardiovascular;  Laterality: Right;   ORIF ANKLE FRACTURE Right 09/02/2019   Procedure: OPEN REDUCTION INTERNAL FIXATION (ORIF) ANKLE FRACTURE BIMALLEOLAR;  Surgeon: [name], DPM;  Location: [facility];  Service: Podiatry;  Laterality: Right;   PERIPHERAL VASCULAR THROMBECTOMY Right 03/02/2020   Procedure: PERIPHERAL VASCULAR THROMBECTOMY;  Surgeon: Algernon Huxley, MD;  Location: [place] [facility];  Service: Cardiovascular;  Laterality: Right;   REPLACEMENT TOTAL KNEE Left 03/2010   Dr. Rudene Christians   TOTAL HIP ARTHROPLASTY Left 12/11/2015   Procedure: TOTAL HIP ARTHROPLASTY ANTERIOR APPROACH;  Surgeon: Hessie Knows, MD;   Location: [facility];  Service: Orthopedics;  Laterality: Left;   TOTAL KNEE ARTHROPLASTY Right 03/11/2016   Procedure: TOTAL KNEE ARTHROPLASTY;  Surgeon: Hessie Knows, MD;  Location: [facility];  Service: Orthopedics;  Laterality: Right;    Family History  Problem Relation Age of Onset   Diabetes Mother    Hyperlipidemia Mother    Hypertension Mother    Diabetes Father    Hyperlipidemia Father    Hypertension Father    Obesity  Father    Hypertension Sister    Hyperlipidemia Sister    Hyperlipidemia Sister    Breast cancer Neg Hx     Allergies  Allergen Reactions   Lisinopril Swelling    CBC Latest Ref Rng & Units 02/23/2021 02/04/2021 09/28/2020  WBC 4.0 - 10.5 K/uL 4.9 5.3 5.2  Hemoglobin 12.0 - 15.0 g/dL 11.1(L) 12.5 12.0  Hematocrit 36.0 - 46.0 % 34.8(L) 38.1 36.8  Platelets 150 - 400 K/uL 146(L) 139(L) 196      CMP     Component Value Date/Time   NA 138 02/23/2021 1049   NA 142 11/26/2015 1122   NA 141 10/18/2012 1716   K 4.2 02/23/2021 1049   K 3.5 10/18/2012 1716   CL 105 02/23/2021 1049   CL 110 (H) 10/18/2012 1716   CO2 26 02/23/2021 1049   CO2 24 10/18/2012 1716   GLUCOSE 118 (H) 02/23/2021 1049   GLUCOSE 128 (H) 10/18/2012 1716   BUN 20 02/23/2021 1049   BUN 27 11/26/2015 1122   BUN 31 (H) 10/18/2012 1716   CREATININE 1.30 (H) 02/23/2021 1049   CREATININE 1.27 (H) 02/04/2021 1123   CALCIUM 9.2 02/23/2021 1049   CALCIUM 7.9 (L) 10/18/2012 1716   PROT 7.8 02/23/2021 1049   PROT 7.6 11/26/2015 1122   PROT 8.0 05/22/2012 2206   ALBUMIN 3.7 02/23/2021 1049   ALBUMIN 4.3 11/26/2015 1122   ALBUMIN 3.4 05/22/2012 2206   AST 27 02/23/2021 1049   AST 34 05/22/2012 2206   ALT 18 02/23/2021 1049   ALT 31 05/22/2012 2206   ALKPHOS 91 02/23/2021 1049   ALKPHOS 115 05/22/2012 2206   BILITOT 0.8 02/23/2021 1049   BILITOT 0.3 11/26/2015 1122   BILITOT 0.3 05/22/2012 2206   GFRNONAA 43 (L) 02/23/2021 1049   GFRNONAA 41 (L) 02/04/2021 1123   GFRAA 48 (L)  02/04/2021 1123     No results found.     Assessment & Plan:   1. Lymphedema Recommend:  No surgery or intervention at this point in time.    Becky Trevino has had a longstanding history of Persistent, chronic and severe lower extremity lymphedema extending into the abdomen and groin area.  The severity is identified by continue to medial thigh edema with skin discoloration and had skin breakdown.For the past 4 weeks, the patient has tried various treatment options with no significant improvement including: Compliant daily wear of 30 mmHg distally compression wraps, regular daily elevation and exercise, along with multiple daily in-home sessions with the [id number] lymphedema pump.  In addition, all appropriate medications and dietary evaluations and changes have been implemented.  Recommendations of the ED [phone] calibrated programmable pump at appropriate garments to better treat the abdomen and groin area.  Her recent abdominal measurements are 158 cm.  Patient should follow-up in six months    2. 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  The patient has chronic deep venous insufficiency that would not benefit from any intervention based on noninvasive studies.  The patient should continue to wear her compression garments as well as utilizing her lymphedema pump.  3. Type 2 diabetes mellitus with stage 3 chronic kidney disease, without long-term current use of insulin, unspecified whether stage 3a or 3b CKD ([place]) Continue hypoglycemic medications as already ordered, these medications have been reviewed and there are no changes at this time.  Hgb A1C to be monitored  as already arranged by primary service   4. Benign essential HTN Continue antihypertensive medications as already ordered, these medications have been reviewed and there are no changes at this time.     Current Outpatient Medications on File Prior to Visit  Medication Sig Dispense Refill   acetaminophen (TYLENOL) 650 MG CR tablet Take 650-1,300 mg by mouth 2 (two) times daily as needed for pain.     ASPIRIN LOW DOSE 81 MG EC tablet TAKE 1 TABLET BY MOUTH EVERY DAY 90 tablet 0   atorvastatin (LIPITOR) 40 MG tablet Take 1 tablet (40 mg total) by mouth every evening. 90 tablet 1   Calcium Carbonate-Vitamin D 600-400 MG-UNIT tablet Take 1 tablet by mouth 2 (two) times daily. 60 tablet 0   doxycycline (VIBRA-TABS) 100 MG tablet Take 1 tablet by mouth in the morning and at bedtime.     doxycycline (VIBRAMYCIN) 100 MG capsule Take 100 mg by mouth 2 (two) times daily.     fluticasone (FLONASE) 50 MCG/ACT nasal spray Place 2 sprays into both nostrils daily. 48 mL 0   ketoconazole (NIZORAL) 2 % cream Apply 1 application topically 2 (two) times daily as needed (rash).      latanoprost (XALATAN) 0.005 % ophthalmic solution SMARTSIG:In Eye(s)     Magnesium Oxide 500 MG CAPS Take 500 mg by mouth daily.      metoprolol succinate (TOPROL-XL) 25 MG 24 hr tablet Take 1 tablet (25 mg total) by mouth daily. 45 tablet 0   mirabegron ER (MYRBETRIQ) 50 MG TB24 tablet Take 1 tablet (50 mg total) by mouth daily. 90 tablet 3   oxyCODONE (OXY IR/ROXICODONE) 5 MG immediate release tablet Take 5 mg by mouth every 4 (four) hours as needed.     polyethylene glycol (MIRALAX / GLYCOLAX) 17 g packet Take 17 g by mouth daily. 14 each 0   potassium chloride (KLOR-CON) 10 MEQ tablet Take 10 mEq by mouth daily.     SIMBRINZA 1-0.2 % SUSP Place 1 drop into both eyes 2 (two) times daily.     traZODone (DESYREL) 50 MG tablet Take 0.5-1 tablets (25-50 mg total) by mouth at bedtime as needed for sleep. 90 tablet 1   levothyroxine (SYNTHROID) 25 MCG tablet TAKE 1 TABLET (25 MCG TOTAL) BY MOUTH DAILY AT 6 (SIX) AM. 90 tablet 0   torsemide (DEMADEX) 20 MG tablet Take 1 tablet (20 mg total) by mouth daily. 90 tablet 1   No current  facility-administered medications on file prior to visit.    There are no Patient Instructions on file for this visit. No follow-ups on file.   Kris Hartmann, NP

## 2021-04-05 ENCOUNTER — Telehealth (INDEPENDENT_AMBULATORY_CARE_PROVIDER_SITE_OTHER): Payer: Self-pay

## 2021-04-05 NOTE — Telephone Encounter (Signed)
I have reached out to [facility] with BioTab and awaiting his response.

## 2021-04-05 NOTE — Telephone Encounter (Signed)
-----   Message from Kris Hartmann, NP sent at 04/03/2021  1:24 PM EDT ----- Please apply for a Lymph Pump. Thanks!   This patient does have a lymph pump however Dr. Lucky Cowboy recommends that she have the pump that extends to her abdomen.  I am not sure if I will have to make addendum's to the note for this.  But if we can reach out to [facility] to see what is necessary.

## 2021-04-09 DIAGNOSIS — H40153 Residual stage of open-angle glaucoma, bilateral: Secondary | ICD-10-CM | POA: Diagnosis not present

## 2021-04-11 DIAGNOSIS — I129 Hypertensive chronic kidney disease with stage 1 through stage 4 chronic kidney disease, or unspecified chronic kidney disease: Secondary | ICD-10-CM | POA: Diagnosis not present

## 2021-04-11 DIAGNOSIS — N179 Acute kidney failure, unspecified: Secondary | ICD-10-CM | POA: Diagnosis not present

## 2021-04-11 DIAGNOSIS — E1140 Type 2 diabetes mellitus with diabetic neuropathy, unspecified: Secondary | ICD-10-CM | POA: Diagnosis not present

## 2021-04-11 DIAGNOSIS — E441 Mild protein-calorie malnutrition: Secondary | ICD-10-CM | POA: Diagnosis not present

## 2021-04-11 DIAGNOSIS — I890 Lymphedema, not elsewhere classified: Secondary | ICD-10-CM | POA: Diagnosis not present

## 2021-04-11 DIAGNOSIS — G4733 Obstructive sleep apnea (adult) (pediatric): Secondary | ICD-10-CM | POA: Diagnosis not present

## 2021-04-11 DIAGNOSIS — Z96652 Presence of left artificial knee joint: Secondary | ICD-10-CM | POA: Diagnosis not present

## 2021-04-11 DIAGNOSIS — E039 Hypothyroidism, unspecified: Secondary | ICD-10-CM | POA: Diagnosis not present

## 2021-04-11 DIAGNOSIS — E1122 Type 2 diabetes mellitus with diabetic chronic kidney disease: Secondary | ICD-10-CM | POA: Diagnosis not present

## 2021-04-11 DIAGNOSIS — E1169 Type 2 diabetes mellitus with other specified complication: Secondary | ICD-10-CM | POA: Diagnosis not present

## 2021-04-11 DIAGNOSIS — M159 Polyosteoarthritis, unspecified: Secondary | ICD-10-CM | POA: Diagnosis not present

## 2021-04-11 DIAGNOSIS — G588 Other specified mononeuropathies: Secondary | ICD-10-CM | POA: Diagnosis not present

## 2021-04-11 DIAGNOSIS — I272 Pulmonary hypertension, unspecified: Secondary | ICD-10-CM | POA: Diagnosis not present

## 2021-04-11 DIAGNOSIS — Z9181 History of falling: Secondary | ICD-10-CM | POA: Diagnosis not present

## 2021-04-11 DIAGNOSIS — M5033 Other cervical disc degeneration, cervicothoracic region: Secondary | ICD-10-CM | POA: Diagnosis not present

## 2021-04-11 DIAGNOSIS — D631 Anemia in chronic kidney disease: Secondary | ICD-10-CM | POA: Diagnosis not present

## 2021-04-11 DIAGNOSIS — Z86718 Personal history of other venous thrombosis and embolism: Secondary | ICD-10-CM | POA: Diagnosis not present

## 2021-04-11 DIAGNOSIS — E538 Deficiency of other specified B group vitamins: Secondary | ICD-10-CM | POA: Diagnosis not present

## 2021-04-11 DIAGNOSIS — N183 Chronic kidney disease, stage 3 unspecified: Secondary | ICD-10-CM | POA: Diagnosis not present

## 2021-04-11 DIAGNOSIS — E785 Hyperlipidemia, unspecified: Secondary | ICD-10-CM | POA: Diagnosis not present

## 2021-04-11 DIAGNOSIS — Z96642 Presence of left artificial hip joint: Secondary | ICD-10-CM | POA: Diagnosis not present

## 2021-04-12 DIAGNOSIS — M5033 Other cervical disc degeneration, cervicothoracic region: Secondary | ICD-10-CM | POA: Diagnosis not present

## 2021-04-12 DIAGNOSIS — I890 Lymphedema, not elsewhere classified: Secondary | ICD-10-CM | POA: Diagnosis not present

## 2021-04-12 DIAGNOSIS — M1990 Unspecified osteoarthritis, unspecified site: Secondary | ICD-10-CM | POA: Diagnosis not present

## 2021-04-12 DIAGNOSIS — M21372 Foot drop, left foot: Secondary | ICD-10-CM | POA: Diagnosis not present

## 2021-04-12 DIAGNOSIS — N1831 Chronic kidney disease, stage 3a: Secondary | ICD-10-CM | POA: Diagnosis not present

## 2021-04-16 ENCOUNTER — Telehealth: Payer: Self-pay

## 2021-04-16 NOTE — Telephone Encounter (Signed)
errenous °

## 2021-04-16 NOTE — Telephone Encounter (Signed)
Pt is requesting a referral for in home care to help with house work, taking her to appts, running errands, etc. She would like to use [facility].

## 2021-04-18 DIAGNOSIS — N183 Chronic kidney disease, stage 3 unspecified: Secondary | ICD-10-CM | POA: Diagnosis not present

## 2021-04-18 DIAGNOSIS — E1122 Type 2 diabetes mellitus with diabetic chronic kidney disease: Secondary | ICD-10-CM | POA: Diagnosis not present

## 2021-04-18 DIAGNOSIS — I129 Hypertensive chronic kidney disease with stage 1 through stage 4 chronic kidney disease, or unspecified chronic kidney disease: Secondary | ICD-10-CM | POA: Diagnosis not present

## 2021-04-18 DIAGNOSIS — M159 Polyosteoarthritis, unspecified: Secondary | ICD-10-CM | POA: Diagnosis not present

## 2021-04-18 DIAGNOSIS — Z96642 Presence of left artificial hip joint: Secondary | ICD-10-CM | POA: Diagnosis not present

## 2021-04-18 DIAGNOSIS — Z96652 Presence of left artificial knee joint: Secondary | ICD-10-CM | POA: Diagnosis not present

## 2021-04-18 DIAGNOSIS — D631 Anemia in chronic kidney disease: Secondary | ICD-10-CM | POA: Diagnosis not present

## 2021-04-18 DIAGNOSIS — I890 Lymphedema, not elsewhere classified: Secondary | ICD-10-CM | POA: Diagnosis not present

## 2021-04-18 DIAGNOSIS — I272 Pulmonary hypertension, unspecified: Secondary | ICD-10-CM | POA: Diagnosis not present

## 2021-04-18 DIAGNOSIS — M5033 Other cervical disc degeneration, cervicothoracic region: Secondary | ICD-10-CM | POA: Diagnosis not present

## 2021-04-18 DIAGNOSIS — Z9181 History of falling: Secondary | ICD-10-CM | POA: Diagnosis not present

## 2021-04-18 DIAGNOSIS — E441 Mild protein-calorie malnutrition: Secondary | ICD-10-CM | POA: Diagnosis not present

## 2021-04-18 DIAGNOSIS — G4733 Obstructive sleep apnea (adult) (pediatric): Secondary | ICD-10-CM | POA: Diagnosis not present

## 2021-04-18 DIAGNOSIS — E1169 Type 2 diabetes mellitus with other specified complication: Secondary | ICD-10-CM | POA: Diagnosis not present

## 2021-04-18 DIAGNOSIS — Z86718 Personal history of other venous thrombosis and embolism: Secondary | ICD-10-CM | POA: Diagnosis not present

## 2021-04-18 DIAGNOSIS — E039 Hypothyroidism, unspecified: Secondary | ICD-10-CM | POA: Diagnosis not present

## 2021-04-18 DIAGNOSIS — E785 Hyperlipidemia, unspecified: Secondary | ICD-10-CM | POA: Diagnosis not present

## 2021-04-18 DIAGNOSIS — E1140 Type 2 diabetes mellitus with diabetic neuropathy, unspecified: Secondary | ICD-10-CM | POA: Diagnosis not present

## 2021-04-18 DIAGNOSIS — E538 Deficiency of other specified B group vitamins: Secondary | ICD-10-CM | POA: Diagnosis not present

## 2021-04-18 DIAGNOSIS — N179 Acute kidney failure, unspecified: Secondary | ICD-10-CM | POA: Diagnosis not present

## 2021-04-18 DIAGNOSIS — G588 Other specified mononeuropathies: Secondary | ICD-10-CM | POA: Diagnosis not present

## 2021-04-21 DIAGNOSIS — L03115 Cellulitis of right lower limb: Secondary | ICD-10-CM | POA: Diagnosis not present

## 2021-04-24 DIAGNOSIS — E1169 Type 2 diabetes mellitus with other specified complication: Secondary | ICD-10-CM | POA: Diagnosis not present

## 2021-04-24 DIAGNOSIS — I129 Hypertensive chronic kidney disease with stage 1 through stage 4 chronic kidney disease, or unspecified chronic kidney disease: Secondary | ICD-10-CM | POA: Diagnosis not present

## 2021-04-24 DIAGNOSIS — N179 Acute kidney failure, unspecified: Secondary | ICD-10-CM | POA: Diagnosis not present

## 2021-04-24 DIAGNOSIS — Z96642 Presence of left artificial hip joint: Secondary | ICD-10-CM | POA: Diagnosis not present

## 2021-04-24 DIAGNOSIS — M159 Polyosteoarthritis, unspecified: Secondary | ICD-10-CM | POA: Diagnosis not present

## 2021-04-24 DIAGNOSIS — E1122 Type 2 diabetes mellitus with diabetic chronic kidney disease: Secondary | ICD-10-CM | POA: Diagnosis not present

## 2021-04-24 DIAGNOSIS — E441 Mild protein-calorie malnutrition: Secondary | ICD-10-CM | POA: Diagnosis not present

## 2021-04-24 DIAGNOSIS — N183 Chronic kidney disease, stage 3 unspecified: Secondary | ICD-10-CM | POA: Diagnosis not present

## 2021-04-24 DIAGNOSIS — I272 Pulmonary hypertension, unspecified: Secondary | ICD-10-CM | POA: Diagnosis not present

## 2021-04-24 DIAGNOSIS — E785 Hyperlipidemia, unspecified: Secondary | ICD-10-CM | POA: Diagnosis not present

## 2021-04-24 DIAGNOSIS — I890 Lymphedema, not elsewhere classified: Secondary | ICD-10-CM | POA: Diagnosis not present

## 2021-04-24 DIAGNOSIS — G588 Other specified mononeuropathies: Secondary | ICD-10-CM | POA: Diagnosis not present

## 2021-04-24 DIAGNOSIS — E039 Hypothyroidism, unspecified: Secondary | ICD-10-CM | POA: Diagnosis not present

## 2021-04-24 DIAGNOSIS — E538 Deficiency of other specified B group vitamins: Secondary | ICD-10-CM | POA: Diagnosis not present

## 2021-04-24 DIAGNOSIS — Z9181 History of falling: Secondary | ICD-10-CM | POA: Diagnosis not present

## 2021-04-24 DIAGNOSIS — D631 Anemia in chronic kidney disease: Secondary | ICD-10-CM | POA: Diagnosis not present

## 2021-04-24 DIAGNOSIS — Z86718 Personal history of other venous thrombosis and embolism: Secondary | ICD-10-CM | POA: Diagnosis not present

## 2021-04-24 DIAGNOSIS — M5033 Other cervical disc degeneration, cervicothoracic region: Secondary | ICD-10-CM | POA: Diagnosis not present

## 2021-04-24 DIAGNOSIS — Z96652 Presence of left artificial knee joint: Secondary | ICD-10-CM | POA: Diagnosis not present

## 2021-04-24 DIAGNOSIS — G4733 Obstructive sleep apnea (adult) (pediatric): Secondary | ICD-10-CM | POA: Diagnosis not present

## 2021-04-24 DIAGNOSIS — E1140 Type 2 diabetes mellitus with diabetic neuropathy, unspecified: Secondary | ICD-10-CM | POA: Diagnosis not present

## 2021-04-26 DIAGNOSIS — L03115 Cellulitis of right lower limb: Secondary | ICD-10-CM | POA: Diagnosis not present

## 2021-04-26 DIAGNOSIS — E1140 Type 2 diabetes mellitus with diabetic neuropathy, unspecified: Secondary | ICD-10-CM | POA: Diagnosis not present

## 2021-04-26 DIAGNOSIS — R2681 Unsteadiness on feet: Secondary | ICD-10-CM | POA: Diagnosis not present

## 2021-04-28 DIAGNOSIS — L03115 Cellulitis of right lower limb: Secondary | ICD-10-CM | POA: Diagnosis not present

## 2021-04-30 ENCOUNTER — Telehealth (INDEPENDENT_AMBULATORY_CARE_PROVIDER_SITE_OTHER): Payer: Self-pay | Admitting: Vascular Surgery

## 2021-04-30 NOTE — Telephone Encounter (Signed)
Called to check status of lymph pump.  Gave patient # for BioTab sent via Mychart

## 2021-05-03 DIAGNOSIS — N179 Acute kidney failure, unspecified: Secondary | ICD-10-CM | POA: Diagnosis not present

## 2021-05-03 DIAGNOSIS — Z96642 Presence of left artificial hip joint: Secondary | ICD-10-CM | POA: Diagnosis not present

## 2021-05-03 DIAGNOSIS — M5033 Other cervical disc degeneration, cervicothoracic region: Secondary | ICD-10-CM | POA: Diagnosis not present

## 2021-05-03 DIAGNOSIS — E441 Mild protein-calorie malnutrition: Secondary | ICD-10-CM | POA: Diagnosis not present

## 2021-05-03 DIAGNOSIS — G4733 Obstructive sleep apnea (adult) (pediatric): Secondary | ICD-10-CM | POA: Diagnosis not present

## 2021-05-03 DIAGNOSIS — E039 Hypothyroidism, unspecified: Secondary | ICD-10-CM | POA: Diagnosis not present

## 2021-05-03 DIAGNOSIS — E1122 Type 2 diabetes mellitus with diabetic chronic kidney disease: Secondary | ICD-10-CM | POA: Diagnosis not present

## 2021-05-03 DIAGNOSIS — M159 Polyosteoarthritis, unspecified: Secondary | ICD-10-CM | POA: Diagnosis not present

## 2021-05-03 DIAGNOSIS — G588 Other specified mononeuropathies: Secondary | ICD-10-CM | POA: Diagnosis not present

## 2021-05-03 DIAGNOSIS — E538 Deficiency of other specified B group vitamins: Secondary | ICD-10-CM | POA: Diagnosis not present

## 2021-05-03 DIAGNOSIS — I272 Pulmonary hypertension, unspecified: Secondary | ICD-10-CM | POA: Diagnosis not present

## 2021-05-03 DIAGNOSIS — Z96652 Presence of left artificial knee joint: Secondary | ICD-10-CM | POA: Diagnosis not present

## 2021-05-03 DIAGNOSIS — I890 Lymphedema, not elsewhere classified: Secondary | ICD-10-CM | POA: Diagnosis not present

## 2021-05-03 DIAGNOSIS — Z9181 History of falling: Secondary | ICD-10-CM | POA: Diagnosis not present

## 2021-05-03 DIAGNOSIS — E1169 Type 2 diabetes mellitus with other specified complication: Secondary | ICD-10-CM | POA: Diagnosis not present

## 2021-05-03 DIAGNOSIS — D631 Anemia in chronic kidney disease: Secondary | ICD-10-CM | POA: Diagnosis not present

## 2021-05-03 DIAGNOSIS — E1140 Type 2 diabetes mellitus with diabetic neuropathy, unspecified: Secondary | ICD-10-CM | POA: Diagnosis not present

## 2021-05-03 DIAGNOSIS — I129 Hypertensive chronic kidney disease with stage 1 through stage 4 chronic kidney disease, or unspecified chronic kidney disease: Secondary | ICD-10-CM | POA: Diagnosis not present

## 2021-05-03 DIAGNOSIS — Z86718 Personal history of other venous thrombosis and embolism: Secondary | ICD-10-CM | POA: Diagnosis not present

## 2021-05-03 DIAGNOSIS — N183 Chronic kidney disease, stage 3 unspecified: Secondary | ICD-10-CM | POA: Diagnosis not present

## 2021-05-03 DIAGNOSIS — E785 Hyperlipidemia, unspecified: Secondary | ICD-10-CM | POA: Diagnosis not present

## 2021-05-06 ENCOUNTER — Encounter: Payer: Self-pay | Admitting: Family Medicine

## 2021-05-08 DIAGNOSIS — M159 Polyosteoarthritis, unspecified: Secondary | ICD-10-CM | POA: Diagnosis not present

## 2021-05-08 DIAGNOSIS — I129 Hypertensive chronic kidney disease with stage 1 through stage 4 chronic kidney disease, or unspecified chronic kidney disease: Secondary | ICD-10-CM | POA: Diagnosis not present

## 2021-05-08 DIAGNOSIS — E1122 Type 2 diabetes mellitus with diabetic chronic kidney disease: Secondary | ICD-10-CM | POA: Diagnosis not present

## 2021-05-08 DIAGNOSIS — E785 Hyperlipidemia, unspecified: Secondary | ICD-10-CM | POA: Diagnosis not present

## 2021-05-08 DIAGNOSIS — M5033 Other cervical disc degeneration, cervicothoracic region: Secondary | ICD-10-CM | POA: Diagnosis not present

## 2021-05-08 DIAGNOSIS — I890 Lymphedema, not elsewhere classified: Secondary | ICD-10-CM | POA: Diagnosis not present

## 2021-05-08 DIAGNOSIS — N183 Chronic kidney disease, stage 3 unspecified: Secondary | ICD-10-CM | POA: Diagnosis not present

## 2021-05-08 DIAGNOSIS — G4733 Obstructive sleep apnea (adult) (pediatric): Secondary | ICD-10-CM | POA: Diagnosis not present

## 2021-05-08 DIAGNOSIS — E1140 Type 2 diabetes mellitus with diabetic neuropathy, unspecified: Secondary | ICD-10-CM | POA: Diagnosis not present

## 2021-05-08 DIAGNOSIS — E1169 Type 2 diabetes mellitus with other specified complication: Secondary | ICD-10-CM | POA: Diagnosis not present

## 2021-05-08 DIAGNOSIS — D631 Anemia in chronic kidney disease: Secondary | ICD-10-CM | POA: Diagnosis not present

## 2021-05-08 DIAGNOSIS — Z9181 History of falling: Secondary | ICD-10-CM | POA: Diagnosis not present

## 2021-05-08 DIAGNOSIS — Z96652 Presence of left artificial knee joint: Secondary | ICD-10-CM | POA: Diagnosis not present

## 2021-05-08 DIAGNOSIS — I272 Pulmonary hypertension, unspecified: Secondary | ICD-10-CM | POA: Diagnosis not present

## 2021-05-08 DIAGNOSIS — E538 Deficiency of other specified B group vitamins: Secondary | ICD-10-CM | POA: Diagnosis not present

## 2021-05-08 DIAGNOSIS — Z96642 Presence of left artificial hip joint: Secondary | ICD-10-CM | POA: Diagnosis not present

## 2021-05-08 DIAGNOSIS — G588 Other specified mononeuropathies: Secondary | ICD-10-CM | POA: Diagnosis not present

## 2021-05-08 DIAGNOSIS — E441 Mild protein-calorie malnutrition: Secondary | ICD-10-CM | POA: Diagnosis not present

## 2021-05-08 DIAGNOSIS — Z86718 Personal history of other venous thrombosis and embolism: Secondary | ICD-10-CM | POA: Diagnosis not present

## 2021-05-08 DIAGNOSIS — N179 Acute kidney failure, unspecified: Secondary | ICD-10-CM | POA: Diagnosis not present

## 2021-05-08 DIAGNOSIS — E039 Hypothyroidism, unspecified: Secondary | ICD-10-CM | POA: Diagnosis not present

## 2021-05-08 NOTE — Telephone Encounter (Signed)
complete

## 2021-05-13 DIAGNOSIS — E785 Hyperlipidemia, unspecified: Secondary | ICD-10-CM | POA: Diagnosis not present

## 2021-05-13 DIAGNOSIS — E039 Hypothyroidism, unspecified: Secondary | ICD-10-CM | POA: Diagnosis not present

## 2021-05-13 DIAGNOSIS — E1140 Type 2 diabetes mellitus with diabetic neuropathy, unspecified: Secondary | ICD-10-CM | POA: Diagnosis not present

## 2021-05-13 DIAGNOSIS — G4733 Obstructive sleep apnea (adult) (pediatric): Secondary | ICD-10-CM | POA: Diagnosis not present

## 2021-05-13 DIAGNOSIS — N179 Acute kidney failure, unspecified: Secondary | ICD-10-CM | POA: Diagnosis not present

## 2021-05-13 DIAGNOSIS — E538 Deficiency of other specified B group vitamins: Secondary | ICD-10-CM | POA: Diagnosis not present

## 2021-05-13 DIAGNOSIS — N1831 Chronic kidney disease, stage 3a: Secondary | ICD-10-CM | POA: Diagnosis not present

## 2021-05-13 DIAGNOSIS — D631 Anemia in chronic kidney disease: Secondary | ICD-10-CM | POA: Diagnosis not present

## 2021-05-13 DIAGNOSIS — Z96642 Presence of left artificial hip joint: Secondary | ICD-10-CM | POA: Diagnosis not present

## 2021-05-13 DIAGNOSIS — E1122 Type 2 diabetes mellitus with diabetic chronic kidney disease: Secondary | ICD-10-CM | POA: Diagnosis not present

## 2021-05-13 DIAGNOSIS — Z96652 Presence of left artificial knee joint: Secondary | ICD-10-CM | POA: Diagnosis not present

## 2021-05-13 DIAGNOSIS — G588 Other specified mononeuropathies: Secondary | ICD-10-CM | POA: Diagnosis not present

## 2021-05-13 DIAGNOSIS — M21372 Foot drop, left foot: Secondary | ICD-10-CM | POA: Diagnosis not present

## 2021-05-13 DIAGNOSIS — N183 Chronic kidney disease, stage 3 unspecified: Secondary | ICD-10-CM | POA: Diagnosis not present

## 2021-05-13 DIAGNOSIS — M1990 Unspecified osteoarthritis, unspecified site: Secondary | ICD-10-CM | POA: Diagnosis not present

## 2021-05-13 DIAGNOSIS — E441 Mild protein-calorie malnutrition: Secondary | ICD-10-CM | POA: Diagnosis not present

## 2021-05-13 DIAGNOSIS — M5033 Other cervical disc degeneration, cervicothoracic region: Secondary | ICD-10-CM | POA: Diagnosis not present

## 2021-05-13 DIAGNOSIS — I129 Hypertensive chronic kidney disease with stage 1 through stage 4 chronic kidney disease, or unspecified chronic kidney disease: Secondary | ICD-10-CM | POA: Diagnosis not present

## 2021-05-13 DIAGNOSIS — Z9181 History of falling: Secondary | ICD-10-CM | POA: Diagnosis not present

## 2021-05-13 DIAGNOSIS — E1169 Type 2 diabetes mellitus with other specified complication: Secondary | ICD-10-CM | POA: Diagnosis not present

## 2021-05-13 DIAGNOSIS — M159 Polyosteoarthritis, unspecified: Secondary | ICD-10-CM | POA: Diagnosis not present

## 2021-05-13 DIAGNOSIS — Z86718 Personal history of other venous thrombosis and embolism: Secondary | ICD-10-CM | POA: Diagnosis not present

## 2021-05-13 DIAGNOSIS — I890 Lymphedema, not elsewhere classified: Secondary | ICD-10-CM | POA: Diagnosis not present

## 2021-05-13 DIAGNOSIS — I272 Pulmonary hypertension, unspecified: Secondary | ICD-10-CM | POA: Diagnosis not present

## 2021-05-14 ENCOUNTER — Other Ambulatory Visit: Payer: Self-pay | Admitting: Family Medicine

## 2021-05-14 DIAGNOSIS — J3089 Other allergic rhinitis: Secondary | ICD-10-CM

## 2021-05-14 NOTE — Telephone Encounter (Signed)
Requested Prescriptions  Pending Prescriptions Disp Refills  . levothyroxine (SYNTHROID) 25 MCG tablet [Pharmacy Med Name: LEVOTHYROXINE 25 MCG TABLET] 90 tablet 1    Sig: TAKE 1 TABLET (25 MCG TOTAL) BY MOUTH DAILY AT 6 (SIX) AM.     Endocrinology:  Hypothyroid Agents Failed - 05/14/2021  3:39 AM      Failed - TSH needs to be rechecked within 3 months after an abnormal result. Refill until TSH is due.      Passed - TSH in normal range and within 360 days    TSH  Date Value Ref Range Status  02/23/2021 1.922 0.350 - 4.500 uIU/mL Final    Comment:    Performed by a 3rd Generation assay with a functional sensitivity of <=0.01 uIU/mL. Performed at [facility], [address] [place], [place] [address]   11/14/2020 3.83 0.40 - 4.50 mIU/L Final         Passed - Valid encounter within last 12 months    Recent Outpatient Visits          3 months ago Type 2 diabetes mellitus with diabetic neuropathy, without long-term current use of insulin [facility])   [facility] [name], MD   4 months ago Gait difficulty   [facility] [place], Drue Stager, MD   6 months ago Type 2 diabetes mellitus with diabetic neuropathy, without long-term current use of insulin [facility])   [facility] [name], MD   7 months ago Hospital discharge follow-up   [facility] [name], MD   8 months ago Type 2 diabetes mellitus with diabetic neuropathy, without long-term current use of insulin [facility])   [facility] [name], MD      Future Appointments            In 3 weeks Ancil Boozer, Drue Stager, MD [facility], [place]   In 1 month [name], Gordan Payment [place]   In 2 months  [facility], [place]           . fluticasone (FLONASE) 50 MCG/ACT nasal spray [Pharmacy Med Name: FLUTICASONE PROP 50 MCG SPRAY] 48 mL 0    Sig: SPRAY 2 SPRAYS  INTO EACH NOSTRIL EVERY DAY     Ear, Nose, and Throat: Nasal Preparations - Corticosteroids Passed - 05/14/2021  3:39 AM      Passed - Valid encounter within last 12 months    Recent Outpatient Visits          3 months ago Type 2 diabetes mellitus with diabetic neuropathy, without long-term current use of insulin [facility])   [facility] [name], MD   4 months ago Gait difficulty   [facility] [place], Drue Stager, MD   6 months ago Type 2 diabetes mellitus with diabetic neuropathy, without long-term current use of insulin [facility])   [facility] [name], MD   7 months ago Hospital discharge follow-up   [facility] [name], MD   8 months ago Type 2 diabetes mellitus with diabetic neuropathy, without long-term current use of insulin [facility])   [facility] [name], MD      Future Appointments            In 3 weeks Ancil Boozer, Drue Stager, MD [facility], [place]   In 1 month [name], Gordan Payment [place]   In 2 months  [place]

## 2021-05-22 DIAGNOSIS — L03115 Cellulitis of right lower limb: Secondary | ICD-10-CM | POA: Diagnosis not present

## 2021-05-23 DIAGNOSIS — G4733 Obstructive sleep apnea (adult) (pediatric): Secondary | ICD-10-CM | POA: Diagnosis not present

## 2021-05-23 DIAGNOSIS — E785 Hyperlipidemia, unspecified: Secondary | ICD-10-CM | POA: Diagnosis not present

## 2021-05-23 DIAGNOSIS — I129 Hypertensive chronic kidney disease with stage 1 through stage 4 chronic kidney disease, or unspecified chronic kidney disease: Secondary | ICD-10-CM | POA: Diagnosis not present

## 2021-05-23 DIAGNOSIS — E441 Mild protein-calorie malnutrition: Secondary | ICD-10-CM | POA: Diagnosis not present

## 2021-05-23 DIAGNOSIS — E1122 Type 2 diabetes mellitus with diabetic chronic kidney disease: Secondary | ICD-10-CM | POA: Diagnosis not present

## 2021-05-23 DIAGNOSIS — N183 Chronic kidney disease, stage 3 unspecified: Secondary | ICD-10-CM | POA: Diagnosis not present

## 2021-05-23 DIAGNOSIS — E1140 Type 2 diabetes mellitus with diabetic neuropathy, unspecified: Secondary | ICD-10-CM | POA: Diagnosis not present

## 2021-05-23 DIAGNOSIS — E538 Deficiency of other specified B group vitamins: Secondary | ICD-10-CM | POA: Diagnosis not present

## 2021-05-23 DIAGNOSIS — M5033 Other cervical disc degeneration, cervicothoracic region: Secondary | ICD-10-CM | POA: Diagnosis not present

## 2021-05-23 DIAGNOSIS — G588 Other specified mononeuropathies: Secondary | ICD-10-CM | POA: Diagnosis not present

## 2021-05-23 DIAGNOSIS — Z96652 Presence of left artificial knee joint: Secondary | ICD-10-CM | POA: Diagnosis not present

## 2021-05-23 DIAGNOSIS — M159 Polyosteoarthritis, unspecified: Secondary | ICD-10-CM | POA: Diagnosis not present

## 2021-05-23 DIAGNOSIS — Z96642 Presence of left artificial hip joint: Secondary | ICD-10-CM | POA: Diagnosis not present

## 2021-05-23 DIAGNOSIS — D631 Anemia in chronic kidney disease: Secondary | ICD-10-CM | POA: Diagnosis not present

## 2021-05-23 DIAGNOSIS — Z86718 Personal history of other venous thrombosis and embolism: Secondary | ICD-10-CM | POA: Diagnosis not present

## 2021-05-23 DIAGNOSIS — N179 Acute kidney failure, unspecified: Secondary | ICD-10-CM | POA: Diagnosis not present

## 2021-05-23 DIAGNOSIS — E1169 Type 2 diabetes mellitus with other specified complication: Secondary | ICD-10-CM | POA: Diagnosis not present

## 2021-05-23 DIAGNOSIS — Z9181 History of falling: Secondary | ICD-10-CM | POA: Diagnosis not present

## 2021-05-23 DIAGNOSIS — E039 Hypothyroidism, unspecified: Secondary | ICD-10-CM | POA: Diagnosis not present

## 2021-05-23 DIAGNOSIS — I272 Pulmonary hypertension, unspecified: Secondary | ICD-10-CM | POA: Diagnosis not present

## 2021-05-23 DIAGNOSIS — I890 Lymphedema, not elsewhere classified: Secondary | ICD-10-CM | POA: Diagnosis not present

## 2021-05-24 ENCOUNTER — Other Ambulatory Visit: Payer: Self-pay

## 2021-05-24 ENCOUNTER — Emergency Department
Admission: EM | Admit: 2021-05-24 | Discharge: 2021-05-24 | Disposition: A | Discharge status: Home / Self Care | Payer: Medicare Other | Attending: Emergency Medicine | Admitting: Emergency Medicine

## 2021-05-24 DIAGNOSIS — M25551 Pain in right hip: Secondary | ICD-10-CM | POA: Insufficient documentation

## 2021-05-24 DIAGNOSIS — E1122 Type 2 diabetes mellitus with diabetic chronic kidney disease: Secondary | ICD-10-CM | POA: Insufficient documentation

## 2021-05-24 DIAGNOSIS — Z96642 Presence of left artificial hip joint: Secondary | ICD-10-CM | POA: Diagnosis not present

## 2021-05-24 DIAGNOSIS — E1169 Type 2 diabetes mellitus with other specified complication: Secondary | ICD-10-CM | POA: Insufficient documentation

## 2021-05-24 DIAGNOSIS — R6889 Other general symptoms and signs: Secondary | ICD-10-CM | POA: Diagnosis not present

## 2021-05-24 DIAGNOSIS — I129 Hypertensive chronic kidney disease with stage 1 through stage 4 chronic kidney disease, or unspecified chronic kidney disease: Secondary | ICD-10-CM | POA: Diagnosis not present

## 2021-05-24 DIAGNOSIS — R531 Weakness: Secondary | ICD-10-CM | POA: Diagnosis not present

## 2021-05-24 DIAGNOSIS — G8929 Other chronic pain: Secondary | ICD-10-CM | POA: Diagnosis not present

## 2021-05-24 DIAGNOSIS — Z7401 Bed confinement status: Secondary | ICD-10-CM | POA: Diagnosis not present

## 2021-05-24 DIAGNOSIS — R58 Hemorrhage, not elsewhere classified: Secondary | ICD-10-CM | POA: Diagnosis not present

## 2021-05-24 DIAGNOSIS — R52 Pain, unspecified: Secondary | ICD-10-CM | POA: Diagnosis not present

## 2021-05-24 DIAGNOSIS — N183 Chronic kidney disease, stage 3 unspecified: Secondary | ICD-10-CM | POA: Insufficient documentation

## 2021-05-24 DIAGNOSIS — Z79899 Other long term (current) drug therapy: Secondary | ICD-10-CM | POA: Insufficient documentation

## 2021-05-24 DIAGNOSIS — R040 Epistaxis: Secondary | ICD-10-CM | POA: Insufficient documentation

## 2021-05-24 DIAGNOSIS — R0902 Hypoxemia: Secondary | ICD-10-CM | POA: Diagnosis not present

## 2021-05-24 DIAGNOSIS — E785 Hyperlipidemia, unspecified: Secondary | ICD-10-CM | POA: Insufficient documentation

## 2021-05-24 DIAGNOSIS — Z96653 Presence of artificial knee joint, bilateral: Secondary | ICD-10-CM | POA: Diagnosis not present

## 2021-05-24 DIAGNOSIS — M25559 Pain in unspecified hip: Secondary | ICD-10-CM

## 2021-05-24 DIAGNOSIS — Z743 Need for continuous supervision: Secondary | ICD-10-CM | POA: Diagnosis not present

## 2021-05-24 MED ORDER — OXYCODONE HCL 5 MG PO TABS
5.0000 mg | ORAL_TABLET | Freq: Once | ORAL | Status: AC
  Administered 2021-05-24: 5 mg via ORAL
  Filled 2021-05-24: qty 1

## 2021-05-24 NOTE — ED Triage Notes (Addendum)
Pt comes via EMs from home with c/o nose bleed. Pt states she was messing with her nose and may have scratched it or wen to far up. Pt states bleeding started. Pt denies any blood thinners.  Pt states right hip pain. Waiting on hip replacement. Pt denies any injuries.  Bleeding controlled at this time.

## 2021-05-24 NOTE — ED Notes (Signed)
See triage note  presents with nosebleed this am  denies any trauma  states she may have picked her nose to cause the bleeding  also having pain to right hip  denies nay recent injury to hip

## 2021-05-24 NOTE — Discharge Instructions (Addendum)
Use saline mist to keep the nose moist and use over-the-counter Afrin to manage any active nosebleeds.  Take your home pain medicines as prescribed.  Follow-up with your primary provider for ongoing symptoms.

## 2021-05-24 NOTE — ED Notes (Signed)
Pt here via EMS from home with c/o scratching her nose and causing it to bleed, affrin given per EMS, bleeding controlled at this time. NAD.

## 2021-05-24 NOTE — ED Provider Notes (Addendum)
[facility] Emergency Department Provider Note ____________________________________________  Time seen: 0735  I have reviewed the triage vital signs and the nursing notes.  HISTORY  Chief Complaint  Epistaxis (Hip pain/)   HPI Becky Trevino is a 75 y.o. female with the below medical history, presents to the ED from home via EMS.  Patient reports a self-induced accidental nosebleed, this morning just prior to arrival.  She admits to picking her nose, when she began to experience an active nosebleed.  She denies any active blood thinner use.  She also endorses her chronic right hip pain.  She denies any recent injuries, fall, nausea, vomiting, or dizziness.  Past Medical History:  Diagnosis Date   Cervical spondylosis    Chronic kidney disease    Stage 3   DDD (degenerative disc disease), cervical    [facility]   Diabetes mellitus without complication (HCC)    Diverticulosis    Hyperlipidemia    Hypertension    Intertrigo    Leg cramps    Leg varices    Obesity    OSA (obstructive sleep apnea)    Symptomatic menopausal or female climacteric states    Syncope     Patient Active Problem List   Diagnosis Date Noted   Anasarca    Hypothyroidism    SVT (supraventricular tachycardia) ([place])    Acute kidney injury superimposed on CKD (HCC)    Hypotension    Class 2 obesity without serious comorbidity with body mass index (BMI) of 39.0 to 39.9 in adult    Thrombocytopenia (HCC)    Mild protein-calorie malnutrition ([place])    Hyperbilirubinemia    Injury of left femoral nerve 08/28/2020   History of DVT (deep vein thrombosis) 06/25/2020   Lymphedema 06/25/2020   History of pelvic hematoma 02/10/2020   Foot ulcer, right, limited to breakdown of skin ([place]) 12/02/2019   MRSA bacteremia 10/30/2019   Chronic venous hypertension due to deep vein thrombosis (DVT) 10/30/2019   Hematoma    Normocytic anemia 08/07/2019   Chronic infection of right knee  ([place]) 11/25/2018   Infection and inflammatory reaction due to internal joint prosthesis, subsequent encounter 01/25/2018   Infection of total right knee replacement ([place]) 11/19/2017   Type 2 diabetes mellitus with hyperlipidemia ([place]) 11/06/2016   Primary osteoarthritis of knee 03/11/2016   Primary osteoarthritis of left hip 12/11/2015   Benign essential HTN 04/21/2015   Edema leg 04/21/2015   Chronic kidney disease (CKD), stage III (moderate) ([place]) 04/21/2015   Chronic venous insufficiency 04/21/2015   DDD (degenerative disc disease), cervical 04/21/2015   Diabetes mellitus with renal manifestation ([place]) 04/21/2015   Hyperlipidemia 04/21/2015   Bladder cystocele 04/21/2015   Urinary incontinence 04/21/2015   Leg varices 04/21/2015   Insomnia 04/21/2015   Eczema intertrigo 04/21/2015   Obesity, Class III, BMI 40-49.9 (morbid obesity) ([place]) 04/21/2015   OSA (obstructive sleep apnea) 04/21/2015   Menopausal and perimenopausal disorder 04/21/2015   Supraventricular premature beats 04/21/2015   Osteoarthritis of multiple joints 04/21/2015   H/O Spinal surgery 11/14/2013   [place]or muscle hypertonia 11/07/2013    Past Surgical History:  Procedure Laterality Date   ABDOMINAL HYSTERECTOMY  1993   Total   BUNIONECTOMY Bilateral [place] Right 11/19/2017   Procedure: RIGHT KNEE POLY EXCHANGE WITH IRRIGATION AND DEBRIDEMENT;  Surgeon: Hessie Knows, MD;  Location: [facility];  Service: Orthopedics;  Laterality: Right;   JOINT REPLACEMENT  bilateral knee   LAMINECTOMY  11/14/2013    Cervical Fusion , [facility], Dr. Delilah Shan   LASER ABLATION Bilateral 07/29/2012   Dr. Lucky Cowboy   LOWER EXTREMITY ANGIOGRAPHY Right 11/02/2019   Procedure: Lower Extremity Angiography;  Surgeon: Katha Cabal, MD;  Location: [place] [facility];  Service: Cardiovascular;  Laterality: Right;   ORIF ANKLE FRACTURE Right 09/02/2019   Procedure: OPEN REDUCTION INTERNAL FIXATION (ORIF) ANKLE  FRACTURE BIMALLEOLAR;  Surgeon: [name], DPM;  Location: [facility];  Service: Podiatry;  Laterality: Right;   PERIPHERAL VASCULAR THROMBECTOMY Right 03/02/2020   Procedure: PERIPHERAL VASCULAR THROMBECTOMY;  Surgeon: Algernon Huxley, MD;  Location: [place] [facility];  Service: Cardiovascular;  Laterality: Right;   REPLACEMENT TOTAL KNEE Left 03/2010   Dr. Rudene Christians   TOTAL HIP ARTHROPLASTY Left 12/11/2015   Procedure: TOTAL HIP ARTHROPLASTY ANTERIOR APPROACH;  Surgeon: Hessie Knows, MD;  Location: [facility];  Service: Orthopedics;  Laterality: Left;   TOTAL KNEE ARTHROPLASTY Right 03/11/2016   Procedure: TOTAL KNEE ARTHROPLASTY;  Surgeon: Hessie Knows, MD;  Location: [facility];  Service: Orthopedics;  Laterality: Right;    Prior to Admission medications   Medication Sig Start Date End Date Taking? Authorizing Provider  acetaminophen (TYLENOL) 650 MG CR tablet Take 650-1,300 mg by mouth 2 (two) times daily as needed for pain.    [provider]  ASPIRIN LOW DOSE 81 MG EC tablet TAKE 1 TABLET BY MOUTH EVERY DAY 01/29/21   Ancil Boozer, Drue Stager, MD  atorvastatin (LIPITOR) 40 MG tablet Take 1 tablet (40 mg total) by mouth every evening. 11/14/20   [name], MD  Calcium Carbonate-Vitamin D 600-400 MG-UNIT tablet Take 1 tablet by mouth 2 (two) times daily. 11/14/20   [name], MD  doxycycline (VIBRA-TABS) 100 MG tablet Take 1 tablet by mouth in the morning and at bedtime. 11/12/20 08/09/21  Leonel Ramsay, MD  doxycycline (VIBRAMYCIN) 100 MG capsule Take 100 mg by mouth 2 (two) times daily. 12/14/20   [provider]  fluticasone (FLONASE) 50 MCG/ACT nasal spray SPRAY 2 SPRAYS INTO EACH NOSTRIL EVERY DAY 05/14/21   Ancil Boozer, Drue Stager, MD  ketoconazole (NIZORAL) 2 % cream Apply 1 application topically 2 (two) times daily as needed (rash).  01/26/15   [provider]  latanoprost (XALATAN) 0.005 % ophthalmic solution SMARTSIG:In Eye(s) 07/12/20   [provider]   levothyroxine (SYNTHROID) 25 MCG tablet TAKE 1 TABLET (25 MCG TOTAL) BY MOUTH DAILY AT 6 (SIX) AM. 05/14/21 06/13/21  [name], MD  Magnesium Oxide 500 MG CAPS Take 500 mg by mouth daily.  11/10/07   [provider]  metoprolol succinate (TOPROL-XL) 25 MG 24 hr tablet Take 1 tablet (25 mg total) by mouth daily. 02/23/21   Vanessa [place], MD  mirabegron ER (MYRBETRIQ) 50 MG TB24 tablet Take 1 tablet (50 mg total) by mouth daily. 06/28/20   Zara Council A, PA-C  oxyCODONE (OXY IR/ROXICODONE) 5 MG immediate release tablet Take 5 mg by mouth every 4 (four) hours as needed. 02/18/21   [provider]  polyethylene glycol (MIRALAX / GLYCOLAX) 17 g packet Take 17 g by mouth daily. 09/15/20   Antonieta Pert, MD  potassium chloride (KLOR-CON) 10 MEQ tablet Take 10 mEq by mouth daily. 08/18/19   [provider]  SIMBRINZA 1-0.2 % SUSP Place 1 drop into both eyes 2 (two) times daily. 10/13/19   [provider]  torsemide (DEMADEX) 20 MG tablet Take 1 tablet (20 mg total) by mouth  daily. 02/04/21 03/06/21  [name], MD  traZODone (DESYREL) 50 MG tablet Take 0.5-1 tablets (25-50 mg total) by mouth at bedtime as needed for sleep. 02/26/21   [name], MD    Allergies Lisinopril  Family History  Problem Relation Age of Onset   Diabetes Mother    Hyperlipidemia Mother    Hypertension Mother    Diabetes Father    Hyperlipidemia Father    Hypertension Father    Obesity Father    Hypertension Sister    Hyperlipidemia Sister    Hyperlipidemia Sister    Breast cancer Neg Hx     Social History Social History   Tobacco Use   Smoking status: Never   Smokeless tobacco: Never  Vaping Use   Vaping Use: Never used  Substance Use Topics   Alcohol use: No    Alcohol/week: 0.0 standard drinks   Drug use: No    Review of Systems  Constitutional: Negative for fever. Eyes: Negative for visual changes. ENT: Negative for sore throat.  Reports nosebleed as  above. Cardiovascular: Negative for chest pain. Respiratory: Negative for shortness of breath. Gastrointestinal: Negative for abdominal pain, vomiting and diarrhea. Genitourinary: Negative for dysuria. Musculoskeletal: Negative for back pain.  Reports chronic hip pain. Skin: Negative for rash. Neurological: Negative for headaches, focal weakness or numbness. ____________________________________________  PHYSICAL EXAM:  VITAL SIGNS: ED Triage Vitals  Enc Vitals Group     BP 05/24/21 0734 135/63     Pulse Rate 05/24/21 0734 86     Resp 05/24/21 0734 19     Temp 05/24/21 0734 (!) 97.4 F (36.3 C)     Temp src --      SpO2 05/24/21 0734 95 %     Weight --      Height --      Head Circumference --      Peak Flow --      Pain Score 05/24/21 0731 10     Pain Loc --      Pain Edu? --      Excl. in [place]? --     Constitutional: Alert and oriented. Well appearing and in no distress. Head: Normocephalic and atraumatic. Eyes: Conjunctivae are normal.  Normal extraocular movements Nose: No congestion/rhinorrhea/epistaxis.  No active epistaxis on exam.  No nasal septal hematoma or nasal lesions appreciated. Neck: Supple. No thyromegaly. Hematological/Lymphatic/Immunological: No cervical lymphadenopathy. Cardiovascular: Normal rate, regular rhythm. Normal distal pulses. Respiratory: Normal respiratory effort. No wheezes/rales/rhonchi. Gastrointestinal: Soft and nontender. No distention. Musculoskeletal: Nontender with normal range of motion in all extremities.  Neurologic:  Normal gait without ataxia. Normal speech and language. No gross focal neurologic deficits are appreciated. Skin:  Skin is warm, dry and intact. No rash noted. Psychiatric: Mood and affect are normal. Patient exhibits appropriate insight and judgment. ____________________________________________    {LABS (pertinent  positives/negatives)  ____________________________________________  {EKG  ____________________________________________   RADIOLOGY Official radiology report(s): No results found. ____________________________________________  PROCEDURES  Roxicodone 5 mg IR p.o. x 2  Procedures ____________________________________________   INITIAL IMPRESSION / ASSESSMENT AND PLAN / ED COURSE  As part of my medical decision making, I reviewed the following data within the [facility] Notes from prior ED visits    Patient ED evaluation of a now resolved accidental nosebleed.  Patient was actively manipulating her nose when the nosebleed began.  She presents to the ED in no acute distress.  Exam is benign and reassuring without any indication of ongoing active bleeding.  Patient is discharged home to follow-up with primary provider as needed.  Return precautions have been reviewed.    VERALYN YURKO was evaluated in Emergency Department on 05/26/2021 for the symptoms described in the history of present illness. She was evaluated in the context of the global COVID-19 pandemic, which necessitated consideration that the patient might be at risk for infection with the SARS-CoV-2 virus that causes COVID-19. Institutional protocols and algorithms that pertain to the evaluation of patients at risk for COVID-19 are in a state of rapid change based on information released by regulatory bodies including the CDC and federal and state organizations. These policies and algorithms were followed during the patient's care in the ED. ____________________________________________  FINAL CLINICAL IMPRESSION(S) / ED DIAGNOSES  Final diagnoses:  Right-sided epistaxis  Chronic hip pain, unspecified laterality      Melvenia Needles, PA-C 05/26/21 [id number] Dannielle Karvonen, PA-C 05/26/21 1117    Vladimir Crofts, MD 05/28/21 [phone]

## 2021-05-27 DIAGNOSIS — R2681 Unsteadiness on feet: Secondary | ICD-10-CM | POA: Diagnosis not present

## 2021-05-27 DIAGNOSIS — L03115 Cellulitis of right lower limb: Secondary | ICD-10-CM | POA: Diagnosis not present

## 2021-05-27 DIAGNOSIS — E1140 Type 2 diabetes mellitus with diabetic neuropathy, unspecified: Secondary | ICD-10-CM | POA: Diagnosis not present

## 2021-05-28 DIAGNOSIS — E441 Mild protein-calorie malnutrition: Secondary | ICD-10-CM | POA: Diagnosis not present

## 2021-05-28 DIAGNOSIS — N179 Acute kidney failure, unspecified: Secondary | ICD-10-CM | POA: Diagnosis not present

## 2021-05-28 DIAGNOSIS — I272 Pulmonary hypertension, unspecified: Secondary | ICD-10-CM | POA: Diagnosis not present

## 2021-05-28 DIAGNOSIS — E1140 Type 2 diabetes mellitus with diabetic neuropathy, unspecified: Secondary | ICD-10-CM | POA: Diagnosis not present

## 2021-05-28 DIAGNOSIS — M159 Polyosteoarthritis, unspecified: Secondary | ICD-10-CM | POA: Diagnosis not present

## 2021-05-28 DIAGNOSIS — M5033 Other cervical disc degeneration, cervicothoracic region: Secondary | ICD-10-CM | POA: Diagnosis not present

## 2021-05-28 DIAGNOSIS — I129 Hypertensive chronic kidney disease with stage 1 through stage 4 chronic kidney disease, or unspecified chronic kidney disease: Secondary | ICD-10-CM | POA: Diagnosis not present

## 2021-05-28 DIAGNOSIS — N183 Chronic kidney disease, stage 3 unspecified: Secondary | ICD-10-CM | POA: Diagnosis not present

## 2021-05-28 DIAGNOSIS — E039 Hypothyroidism, unspecified: Secondary | ICD-10-CM | POA: Diagnosis not present

## 2021-05-28 DIAGNOSIS — E1169 Type 2 diabetes mellitus with other specified complication: Secondary | ICD-10-CM | POA: Diagnosis not present

## 2021-05-28 DIAGNOSIS — E538 Deficiency of other specified B group vitamins: Secondary | ICD-10-CM | POA: Diagnosis not present

## 2021-05-28 DIAGNOSIS — D631 Anemia in chronic kidney disease: Secondary | ICD-10-CM | POA: Diagnosis not present

## 2021-05-28 DIAGNOSIS — Z86718 Personal history of other venous thrombosis and embolism: Secondary | ICD-10-CM | POA: Diagnosis not present

## 2021-05-28 DIAGNOSIS — E785 Hyperlipidemia, unspecified: Secondary | ICD-10-CM | POA: Diagnosis not present

## 2021-05-28 DIAGNOSIS — G588 Other specified mononeuropathies: Secondary | ICD-10-CM | POA: Diagnosis not present

## 2021-05-28 DIAGNOSIS — E1122 Type 2 diabetes mellitus with diabetic chronic kidney disease: Secondary | ICD-10-CM | POA: Diagnosis not present

## 2021-05-28 DIAGNOSIS — Z96652 Presence of left artificial knee joint: Secondary | ICD-10-CM | POA: Diagnosis not present

## 2021-05-28 DIAGNOSIS — G4733 Obstructive sleep apnea (adult) (pediatric): Secondary | ICD-10-CM | POA: Diagnosis not present

## 2021-05-28 DIAGNOSIS — Z96642 Presence of left artificial hip joint: Secondary | ICD-10-CM | POA: Diagnosis not present

## 2021-05-28 DIAGNOSIS — Z9181 History of falling: Secondary | ICD-10-CM | POA: Diagnosis not present

## 2021-05-28 DIAGNOSIS — I890 Lymphedema, not elsewhere classified: Secondary | ICD-10-CM | POA: Diagnosis not present

## 2021-05-29 DIAGNOSIS — L03115 Cellulitis of right lower limb: Secondary | ICD-10-CM | POA: Diagnosis not present

## 2021-06-02 DIAGNOSIS — Z86718 Personal history of other venous thrombosis and embolism: Secondary | ICD-10-CM | POA: Diagnosis not present

## 2021-06-02 DIAGNOSIS — E1122 Type 2 diabetes mellitus with diabetic chronic kidney disease: Secondary | ICD-10-CM | POA: Diagnosis not present

## 2021-06-02 DIAGNOSIS — G4733 Obstructive sleep apnea (adult) (pediatric): Secondary | ICD-10-CM | POA: Diagnosis not present

## 2021-06-02 DIAGNOSIS — N183 Chronic kidney disease, stage 3 unspecified: Secondary | ICD-10-CM | POA: Diagnosis not present

## 2021-06-02 DIAGNOSIS — Z96652 Presence of left artificial knee joint: Secondary | ICD-10-CM | POA: Diagnosis not present

## 2021-06-02 DIAGNOSIS — M159 Polyosteoarthritis, unspecified: Secondary | ICD-10-CM | POA: Diagnosis not present

## 2021-06-02 DIAGNOSIS — E785 Hyperlipidemia, unspecified: Secondary | ICD-10-CM | POA: Diagnosis not present

## 2021-06-02 DIAGNOSIS — G588 Other specified mononeuropathies: Secondary | ICD-10-CM | POA: Diagnosis not present

## 2021-06-02 DIAGNOSIS — I890 Lymphedema, not elsewhere classified: Secondary | ICD-10-CM | POA: Diagnosis not present

## 2021-06-02 DIAGNOSIS — E538 Deficiency of other specified B group vitamins: Secondary | ICD-10-CM | POA: Diagnosis not present

## 2021-06-02 DIAGNOSIS — I129 Hypertensive chronic kidney disease with stage 1 through stage 4 chronic kidney disease, or unspecified chronic kidney disease: Secondary | ICD-10-CM | POA: Diagnosis not present

## 2021-06-02 DIAGNOSIS — E1169 Type 2 diabetes mellitus with other specified complication: Secondary | ICD-10-CM | POA: Diagnosis not present

## 2021-06-02 DIAGNOSIS — M5033 Other cervical disc degeneration, cervicothoracic region: Secondary | ICD-10-CM | POA: Diagnosis not present

## 2021-06-02 DIAGNOSIS — N179 Acute kidney failure, unspecified: Secondary | ICD-10-CM | POA: Diagnosis not present

## 2021-06-02 DIAGNOSIS — I272 Pulmonary hypertension, unspecified: Secondary | ICD-10-CM | POA: Diagnosis not present

## 2021-06-02 DIAGNOSIS — E1140 Type 2 diabetes mellitus with diabetic neuropathy, unspecified: Secondary | ICD-10-CM | POA: Diagnosis not present

## 2021-06-02 DIAGNOSIS — E441 Mild protein-calorie malnutrition: Secondary | ICD-10-CM | POA: Diagnosis not present

## 2021-06-02 DIAGNOSIS — D631 Anemia in chronic kidney disease: Secondary | ICD-10-CM | POA: Diagnosis not present

## 2021-06-02 DIAGNOSIS — Z96642 Presence of left artificial hip joint: Secondary | ICD-10-CM | POA: Diagnosis not present

## 2021-06-02 DIAGNOSIS — E039 Hypothyroidism, unspecified: Secondary | ICD-10-CM | POA: Diagnosis not present

## 2021-06-02 DIAGNOSIS — Z9181 History of falling: Secondary | ICD-10-CM | POA: Diagnosis not present

## 2021-06-03 DIAGNOSIS — I82511 Chronic embolism and thrombosis of right femoral vein: Secondary | ICD-10-CM | POA: Diagnosis not present

## 2021-06-03 DIAGNOSIS — I700 Atherosclerosis of aorta: Secondary | ICD-10-CM | POA: Diagnosis not present

## 2021-06-03 DIAGNOSIS — I10 Essential (primary) hypertension: Secondary | ICD-10-CM | POA: Diagnosis not present

## 2021-06-03 DIAGNOSIS — N183 Chronic kidney disease, stage 3 unspecified: Secondary | ICD-10-CM | POA: Diagnosis not present

## 2021-06-03 DIAGNOSIS — I272 Pulmonary hypertension, unspecified: Secondary | ICD-10-CM | POA: Diagnosis not present

## 2021-06-03 DIAGNOSIS — I4891 Unspecified atrial fibrillation: Secondary | ICD-10-CM | POA: Diagnosis not present

## 2021-06-03 DIAGNOSIS — R600 Localized edema: Secondary | ICD-10-CM | POA: Diagnosis not present

## 2021-06-03 DIAGNOSIS — I872 Venous insufficiency (chronic) (peripheral): Secondary | ICD-10-CM | POA: Diagnosis not present

## 2021-06-03 DIAGNOSIS — E119 Type 2 diabetes mellitus without complications: Secondary | ICD-10-CM | POA: Diagnosis not present

## 2021-06-03 DIAGNOSIS — E782 Mixed hyperlipidemia: Secondary | ICD-10-CM | POA: Diagnosis not present

## 2021-06-03 DIAGNOSIS — G4730 Sleep apnea, unspecified: Secondary | ICD-10-CM | POA: Diagnosis not present

## 2021-06-06 NOTE — Progress Notes (Addendum)
Name: Becky Trevino   MRN: [id number]    DOB: 11/03/1945   Date:06/07/2021       Progress Note  Subjective  Chief Complaint  Follow Up  HPI  DMII with renal manifestation and obesity:  she denies polyuria, polyphagia or polydipsia. A1C done 09/2020 was 6.1 % t, last one was 5.3 % and today 5.5 %  last urine micro was normal.  She has CKI - last GFR 43 , dyslipidemia and obesity . She cannot take ACE due to angioedema   Obesity Morbid: she has been unable to be active, however she is trying to eat healthier , she wants to resume PT to help with physical activity and weight loss   History of hypotension:  she was  Midodrine . She is now off midodrine and tolerating 25 mg of metoprolol, heart rate is at goal, she states she states no longer having dizziness like she used to , only when she gets up quickly    Chronic DVT  right popliteal vein: diagnosed 10/27/2019, she was on Xarelto and at one time Eliquis  but stopped secondary to iliopsoas hematoma, she still has some swelling of right thigh, she has an IV filter. She has been wearing compression stocking hoses and pump and seems to help with symptoms a little, they are trying to get a pump that goes up to her thighs now . She does not have pain from the DVT but has pain on right hip due to OA - pain only present when she is standing or walking She is getting PT now. She had one episode of nose bleed, went to [facility] but by the time she arrived the bleeding had stopped , she had a few more episodes at home and was able to stop it on her own, no bleeding in the past couple of months.    Hyperlipidemia: taking Atorvastatin daily and denies side effects. No chest pain , no muscles pain.  Last lipid panel was done May 22 and it was at goal at 51    Insomnia/OSA :Last Echo done by Dr. Clayborn Bigness showed mild pulmonary hypertension . Reminded her to resume CPAP , she states she wants to see Dr. Tami Ribas first before resuming CPAP , worried because of nose  bleeds  OA: multi joints, sees Ortho, Dr. Rudene Christians . She had revision of right knee replacement March 2019 because of infection. Still under the care of Ortho now also seeing Dr. Ola Spurr - ID. She is still on daily antibiotics , she is frustrated because she cannot have right hip replacement now .   Left femoral nerve palsy:  seen by Dr. Rudene Christians, Dr. Manuella Ghazi, now seeing neurosurgeon at [facility] , Dr. Tamala Julian. She is able to walk short distances but s wearing brace because left knee is unstable due to femoral nerve palsy. She still  needs assistance bathing, cannot drive, cannot transfer alone . She was diagnosed with a hematoma of left iliopsoas during hospital stay 10/2019 , but she had repeat  CT pelvis 01/09/2020 that showed resolution of hematoma. She is now off Eliquis and on aspirin only due to history of hematoma Dr. Tamala Julian reviewed her case and advised against doing the surgery to decompress the nerve.  She has a motorized wheelchair and has been having PT and is feeling stronger and more independent at home. She is able to get dressed on her own, but not able to cook, bath or get into her bed on her own. She states she  would like to have someone to help her daily and take her to office visits. She has difficulty re-positioning her body in bed due to difficulty lifting and moving her left leg , needs assistance transferring in and out of the bed due to weakness   CKI stage III: last GFR was 43, normal urine micro , not on ace at this time. We will consider resuming it since her bp is now 130/72 , we will recheck labs today   Atherosclerosis of Aorta: she is on statin and Xarelto. Discussed result with patient   Anemia: under the care of Dr. Tasia Catchings, we will check iron studies today due to recent episode of nose bleeds   Patient Active Problem List   Diagnosis Date Noted   Anasarca    Hypothyroidism    SVT (supraventricular tachycardia) ([place])    Acute kidney injury superimposed on CKD ([place])    Hypotension     Class 2 obesity without serious comorbidity with body mass index (BMI) of 39.0 to 39.9 in adult    Thrombocytopenia ([place])    Mild protein-calorie malnutrition ([place])    Hyperbilirubinemia    Injury of left femoral nerve 08/28/2020   History of DVT (deep vein thrombosis) 06/25/2020   Lymphedema 06/25/2020   History of pelvic hematoma 02/10/2020   Foot ulcer, right, limited to breakdown of skin ([place]) 12/02/2019   MRSA bacteremia 10/30/2019   Chronic venous hypertension due to deep vein thrombosis (DVT) 10/30/2019   Hematoma    Normocytic anemia 08/07/2019   Chronic infection of right knee ([place]) 11/25/2018   Infection and inflammatory reaction due to internal joint prosthesis, subsequent encounter 01/25/2018   Infection of total right knee replacement ([place]) 11/19/2017   Type 2 diabetes mellitus with hyperlipidemia ([place]) 11/06/2016   Primary osteoarthritis of knee 03/11/2016   Primary osteoarthritis of left hip 12/11/2015   Benign essential HTN 04/21/2015   Edema leg 04/21/2015   Chronic kidney disease (CKD), stage III (moderate) ([place]) 04/21/2015   Chronic venous insufficiency 04/21/2015   DDD (degenerative disc disease), cervical 04/21/2015   Diabetes mellitus with renal manifestation ([place]) 04/21/2015   Hyperlipidemia 04/21/2015   Bladder cystocele 04/21/2015   Urinary incontinence 04/21/2015   Leg varices 04/21/2015   Insomnia 04/21/2015   Eczema intertrigo 04/21/2015   Obesity, Class III, BMI 40-49.9 (morbid obesity) ([place]) 04/21/2015   OSA (obstructive sleep apnea) 04/21/2015   Menopausal and perimenopausal disorder 04/21/2015   Supraventricular premature beats 04/21/2015   Osteoarthritis of multiple joints 04/21/2015   H/O Spinal surgery 11/14/2013   [place]or muscle hypertonia 11/07/2013    Past Surgical History:  Procedure Laterality Date   ABDOMINAL HYSTERECTOMY  1993   Total   BUNIONECTOMY Bilateral [place] Right 11/19/2017   Procedure: RIGHT  KNEE POLY EXCHANGE WITH IRRIGATION AND DEBRIDEMENT;  Surgeon: Hessie Knows, MD;  Location: [facility];  Service: Orthopedics;  Laterality: Right;   JOINT REPLACEMENT     bilateral knee   LAMINECTOMY  11/14/2013    Cervical Fusion , [facility], Dr. Delilah Shan   LASER ABLATION Bilateral 07/29/2012   Dr. Lucky Cowboy   LOWER EXTREMITY ANGIOGRAPHY Right 11/02/2019   Procedure: Lower Extremity Angiography;  Surgeon: Katha Cabal, MD;  Location: [facility];  Service: Cardiovascular;  Laterality: Right;   ORIF ANKLE FRACTURE Right 09/02/2019   Procedure: OPEN REDUCTION INTERNAL FIXATION (ORIF) ANKLE FRACTURE BIMALLEOLAR;  Surgeon: [name], DPM;  Location: [facility];  Service: Podiatry;  Laterality: Right;   PERIPHERAL VASCULAR THROMBECTOMY Right 03/02/2020   Procedure: PERIPHERAL VASCULAR THROMBECTOMY;  Surgeon: Algernon Huxley, MD;  Location: [facility];  Service: Cardiovascular;  Laterality: Right;   REPLACEMENT TOTAL KNEE Left 03/2010   Dr. Rudene Christians   TOTAL HIP ARTHROPLASTY Left 12/11/2015   Procedure: TOTAL HIP ARTHROPLASTY ANTERIOR APPROACH;  Surgeon: Hessie Knows, MD;  Location: [facility];  Service: Orthopedics;  Laterality: Left;   TOTAL KNEE ARTHROPLASTY Right 03/11/2016   Procedure: TOTAL KNEE ARTHROPLASTY;  Surgeon: Hessie Knows, MD;  Location: [facility];  Service: Orthopedics;  Laterality: Right;    Family History  Problem Relation Age of Onset   Diabetes Mother    Hyperlipidemia Mother    Hypertension Mother    Diabetes Father    Hyperlipidemia Father    Hypertension Father    Obesity Father    Hypertension Sister    Hyperlipidemia Sister    Hyperlipidemia Sister    Breast cancer Neg Hx     Social History   Tobacco Use   Smoking status: Never   Smokeless tobacco: Never  Substance Use Topics   Alcohol use: No    Alcohol/week: 0.0 standard drinks     Current Outpatient Medications:    acetaminophen (TYLENOL) 650 MG CR tablet, Take 650-1,300 mg by mouth 2 (two) times  daily as needed for pain., Disp: , Rfl:    ASPIRIN LOW DOSE 81 MG EC tablet, TAKE 1 TABLET BY MOUTH EVERY DAY, Disp: 90 tablet, Rfl: 0   atorvastatin (LIPITOR) 40 MG tablet, Take 1 tablet (40 mg total) by mouth every evening., Disp: 90 tablet, Rfl: 1   Calcium Carbonate-Vitamin D 600-400 MG-UNIT tablet, Take 1 tablet by mouth 2 (two) times daily., Disp: 60 tablet, Rfl: 0   doxycycline (VIBRA-TABS) 100 MG tablet, Take 1 tablet by mouth in the morning and at bedtime., Disp: , Rfl:    doxycycline (VIBRAMYCIN) 100 MG capsule, Take 100 mg by mouth 2 (two) times daily., Disp: , Rfl:    fluticasone (FLONASE) 50 MCG/ACT nasal spray, SPRAY 2 SPRAYS INTO EACH NOSTRIL EVERY DAY, Disp: 48 mL, Rfl: 0   ketoconazole (NIZORAL) 2 % cream, Apply 1 application topically 2 (two) times daily as needed (rash). , Disp: , Rfl:    latanoprost (XALATAN) 0.005 % ophthalmic solution, SMARTSIG:In Eye(s), Disp: , Rfl:    levothyroxine (SYNTHROID) 25 MCG tablet, TAKE 1 TABLET (25 MCG TOTAL) BY MOUTH DAILY AT 6 (SIX) AM., Disp: 90 tablet, Rfl: 1   Magnesium Oxide 500 MG CAPS, Take 500 mg by mouth daily. , Disp: , Rfl:    metoprolol succinate (TOPROL-XL) 25 MG 24 hr tablet, Take 1 tablet (25 mg total) by mouth daily., Disp: 45 tablet, Rfl: 0   mirabegron ER (MYRBETRIQ) 50 MG TB24 tablet, Take 1 tablet (50 mg total) by mouth daily., Disp: 90 tablet, Rfl: 3   oxyCODONE (OXY IR/ROXICODONE) 5 MG immediate release tablet, Take 5 mg by mouth every 4 (four) hours as needed., Disp: , Rfl:    polyethylene glycol (MIRALAX / GLYCOLAX) 17 g packet, Take 17 g by mouth daily., Disp: 14 each, Rfl: 0   potassium chloride (KLOR-CON) 10 MEQ tablet, Take 10 mEq by mouth daily., Disp: , Rfl:    SIMBRINZA 1-0.2 % SUSP, Place 1 drop into both eyes 2 (two) times daily., Disp: , Rfl:    traZODone (DESYREL) 50 MG tablet, Take 0.5-1 tablets (25-50 mg total) by mouth at bedtime as needed for sleep., Disp:  90 tablet, Rfl: 1   torsemide (DEMADEX) 20 MG  tablet, Take 1 tablet (20 mg total) by mouth daily., Disp: 90 tablet, Rfl: 1  Allergies  Allergen Reactions   Lisinopril Swelling    I personally reviewed active problem list, medication list, allergies, family history, social history, health maintenance with the patient/caregiver today.   ROS  Constitutional: Negative for fever or weight change.  Respiratory: Negative for cough and shortness of breath.   Cardiovascular: Negative for chest pain or palpitations.  Gastrointestinal: Negative for abdominal pain, no bowel changes.  Musculoskeletal: Positive  for gait problem and knee joint swelling.  Skin: Negative for rash.  Neurological: Negative for dizziness or headache.  No other specific complaints in a complete review of systems (except as listed in HPI above).   Objective  Vitals:   06/07/21 1011  BP: 130/72  Pulse: 75  Resp: 16  Temp: 97.9 F (36.6 C)  SpO2: 98%  Weight: (!) 320 lb (145.2 kg)  Height: 5\' 11"  (1.803 m)    Body mass index is 44.63 kg/m.  Physical Exam  Constitutional: Patient appears well-developed and well-nourished. Obese  No distress.  HEENT: head atraumatic, normocephalic, pupils equal and reactive to light, neck supple Cardiovascular: Normal rate, regular rhythm and normal heart sounds.  No murmur heard. No BLE edema. Pulmonary/Chest: Effort normal and breath sounds normal. No respiratory distress. Abdominal: Soft.  There is no tenderness. Muscular Skeletal: sitting on motorized chair, leaning to her right since she has less sensation on left hip  Psychiatric: Patient has a normal mood and affect. behavior is normal. Judgment and thought content normal.   Recent Results (from the past 2160 hour(s))  HM DIABETES EYE EXAM     Status: None   Collection Time: 03/22/21 12:00 AM  Result Value Ref Range   HM Diabetic Eye Exam No Retinopathy No Retinopathy  POCT HgB A1C     Status: None   Collection Time: 06/07/21 10:12 AM  Result Value Ref Range    Hemoglobin A1C 5.5 4.0 - 5.6 %   HbA1c POC (<> result, manual entry)     HbA1c, POC (prediabetic range)     HbA1c, POC (controlled diabetic range)       PHQ2/9: Depression screen [facility] 2/9 06/07/2021 02/04/2021 01/01/2021 11/14/2020 09/28/2020  Decreased Interest 0 0 0 0 0  Down, Depressed, Hopeless 0 0 0 0 0  PHQ - 2 Score 0 0 0 0 0  Altered sleeping - - - - 0  Tired, decreased energy - - - - 0  Change in appetite - - - - 0  Feeling bad or failure about yourself  - - - - 0  Trouble concentrating - - - - 0  Moving slowly or fidgety/restless - - - - 0  Suicidal thoughts - - - - 0  PHQ-9 Score - - - - 0  Difficult doing work/chores - - - - Not difficult at all  Some recent data might be hidden    phq 9 is negative   Fall Risk: Fall Risk  06/07/2021 02/04/2021 01/01/2021 11/14/2020 09/28/2020  Falls in the past year? 0 0 0 1 1  Number falls in past yr: 0 0 0 1 1  Comment - - - - -  Injury with Fall? 0 0 0 0 0  Comment - - - - -  Risk for fall due to : Impaired mobility - - - -  Follow up Falls prevention discussed - - - -  Functional Status Survey: Is the patient deaf or have difficulty hearing?: No Does the patient have difficulty seeing, even when wearing glasses/contacts?: No Does the patient have difficulty concentrating, remembering, or making decisions?: No Does the patient have difficulty walking or climbing stairs?: Yes Does the patient have difficulty dressing or bathing?: No Does the patient have difficulty doing errands alone such as visiting a doctor's office or shopping?: No    Assessment & Plan  1. Type 2 diabetes mellitus with diabetic neuropathy, without long-term current use of insulin (HCC)  - POCT HgB A1C  2. Need for shingles vaccine  - Varicella-zoster vaccine IM (Shingrix)  3. Need for immunization against influenza  - Flu Vaccine QUAD High Dose(Fluad)  4. Dyslipidemia  - atorvastatin (LIPITOR) 40 MG tablet; Take 1 tablet (40 mg total) by  mouth every evening.  Dispense: 90 tablet; Refill: 1  5. Thrombocytopenia ([place])  She will have CBC done next month by Dr. Tasia Catchings  6. Benign essential HTN  - COMPLETE METABOLIC PANEL WITH GFR  7. Stage 3 chronic kidney disease, unspecified whether stage 3a or 3b CKD (HCC)  - COMPLETE METABOLIC PANEL WITH GFR - Parathyroid hormone, intact (no Ca)  8. B12 deficiency   9. Morbid obesity ([place])  Discussed with the patient the risk posed by an increased BMI. Discussed importance of portion control, calorie counting and at least 150 minutes of physical activity weekly. Avoid sweet beverages and drink more water. Eat at least 6 servings of fruit and vegetables daily    10. Dyslipidemia associated with type 2 diabetes mellitus ([place])  On statin   11. Lower leg DVT (deep venous thromboembolism), chronic, right ([place])  On Xarelto   12. Lower extremity edema   13. Pulmonary hypertension ([place])  Needs to resume CPAP   14. Gait difficulty   15. Injury of left femoral nerve, sequela   16. Obstructive apnea  She is not wearing CPAP at this time  17. Hypothyroidism, unspecified type  On levothyroxine  18. Chronic deep vein thrombosis (DVT) of femoral vein of right lower extremity (HCC)   19. Colon cancer screening  - Cologuard  20. Anemia, unspecified type  - Iron, TIBC and Ferritin Panel  21. Atherosclerosis of aorta ([place])

## 2021-06-07 ENCOUNTER — Ambulatory Visit (INDEPENDENT_AMBULATORY_CARE_PROVIDER_SITE_OTHER): Payer: Medicare Other | Admitting: Family Medicine

## 2021-06-07 ENCOUNTER — Encounter: Payer: Self-pay | Admitting: Family Medicine

## 2021-06-07 ENCOUNTER — Other Ambulatory Visit: Payer: Self-pay

## 2021-06-07 VITALS — BP 130/72 | HR 75 | Temp 97.9°F | Resp 16 | Ht 71.0 in | Wt 320.0 lb

## 2021-06-07 DIAGNOSIS — I10 Essential (primary) hypertension: Secondary | ICD-10-CM | POA: Diagnosis not present

## 2021-06-07 DIAGNOSIS — I700 Atherosclerosis of aorta: Secondary | ICD-10-CM | POA: Insufficient documentation

## 2021-06-07 DIAGNOSIS — E785 Hyperlipidemia, unspecified: Secondary | ICD-10-CM | POA: Diagnosis not present

## 2021-06-07 DIAGNOSIS — Z1211 Encounter for screening for malignant neoplasm of colon: Secondary | ICD-10-CM

## 2021-06-07 DIAGNOSIS — E039 Hypothyroidism, unspecified: Secondary | ICD-10-CM

## 2021-06-07 DIAGNOSIS — I272 Pulmonary hypertension, unspecified: Secondary | ICD-10-CM

## 2021-06-07 DIAGNOSIS — S7412XS Injury of femoral nerve at hip and thigh level, left leg, sequela: Secondary | ICD-10-CM

## 2021-06-07 DIAGNOSIS — Z23 Encounter for immunization: Secondary | ICD-10-CM | POA: Diagnosis not present

## 2021-06-07 DIAGNOSIS — N183 Chronic kidney disease, stage 3 unspecified: Secondary | ICD-10-CM

## 2021-06-07 DIAGNOSIS — D696 Thrombocytopenia, unspecified: Secondary | ICD-10-CM

## 2021-06-07 DIAGNOSIS — E1169 Type 2 diabetes mellitus with other specified complication: Secondary | ICD-10-CM | POA: Diagnosis not present

## 2021-06-07 DIAGNOSIS — R269 Unspecified abnormalities of gait and mobility: Secondary | ICD-10-CM

## 2021-06-07 DIAGNOSIS — E538 Deficiency of other specified B group vitamins: Secondary | ICD-10-CM | POA: Diagnosis not present

## 2021-06-07 DIAGNOSIS — I825Z1 Chronic embolism and thrombosis of unspecified deep veins of right distal lower extremity: Secondary | ICD-10-CM

## 2021-06-07 DIAGNOSIS — G4733 Obstructive sleep apnea (adult) (pediatric): Secondary | ICD-10-CM

## 2021-06-07 DIAGNOSIS — E1140 Type 2 diabetes mellitus with diabetic neuropathy, unspecified: Secondary | ICD-10-CM | POA: Diagnosis not present

## 2021-06-07 DIAGNOSIS — R600 Localized edema: Secondary | ICD-10-CM | POA: Diagnosis not present

## 2021-06-07 DIAGNOSIS — D649 Anemia, unspecified: Secondary | ICD-10-CM

## 2021-06-07 DIAGNOSIS — I82511 Chronic embolism and thrombosis of right femoral vein: Secondary | ICD-10-CM

## 2021-06-07 LAB — POCT GLYCOSYLATED HEMOGLOBIN (HGB A1C): Hemoglobin A1C: 5.5 % (ref 4.0–5.6)

## 2021-06-07 MED ORDER — ATORVASTATIN CALCIUM 40 MG PO TABS: 40.0000 mg | ORAL_TABLET | Freq: Every evening | ORAL | 1 refills | Status: DC

## 2021-06-10 ENCOUNTER — Telehealth: Payer: Self-pay

## 2021-06-10 ENCOUNTER — Telehealth: Payer: Self-pay | Admitting: *Deleted

## 2021-06-10 LAB — COMPLETE METABOLIC PANEL WITH GFR
AG Ratio: 1.2 (calc) (ref 1.0–2.5)
ALT: 15 U/L (ref 6–29)
AST: 21 U/L (ref 10–35)
Albumin: 4.4 g/dL (ref 3.6–5.1)
Alkaline phosphatase (APISO): 111 U/L (ref 37–153)
BUN/Creatinine Ratio: 10 (calc) (ref 6–22)
BUN: 11 mg/dL (ref 7–25)
CO2: 30 mmol/L (ref 20–32)
Calcium: 10.1 mg/dL (ref 8.6–10.4)
Chloride: 103 mmol/L (ref 98–110)
Creat: 1.12 mg/dL — ABNORMAL HIGH (ref 0.60–1.00)
Globulin: 3.6 g/dL (calc) (ref 1.9–3.7)
Glucose, Bld: 96 mg/dL (ref 65–99)
Potassium: 4.4 mmol/L (ref 3.5–5.3)
Sodium: 140 mmol/L (ref 135–146)
Total Bilirubin: 0.8 mg/dL (ref 0.2–1.2)
Total Protein: 8 g/dL (ref 6.1–8.1)
eGFR: 51 mL/min/{1.73_m2} — ABNORMAL LOW (ref >=60)

## 2021-06-10 LAB — PARATHYROID HORMONE, INTACT (NO CA): PTH: 44 pg/mL (ref 16–77)

## 2021-06-10 LAB — IRON,TIBC AND FERRITIN PANEL
%SAT: 18 % (calc) (ref 16–45)
Ferritin: 99 ng/mL (ref 16–288)
Iron: 46 ug/dL (ref 45–160)
TIBC: 251 mcg/dL (calc) (ref 250–450)

## 2021-06-10 NOTE — Telephone Encounter (Signed)
Spoke to patient, relayed results per Dr.Sowles. Patient was pleasant and verbalized understanding with no questions or concerns to verbalize at this time.

## 2021-06-10 NOTE — Chronic Care Management (AMB) (Signed)
  Chronic Care Management   Note  06/10/2021 Name: SHERISA GILVIN MRN: [id number] DOB: Nov 25, 1945  KEIDY THURGOOD is a 75 y.o. year old female who is a primary care patient of [name], MD. I reached out to [name] by phone today in response to a referral sent by Ms. Farrell [facility] [place]'s PCP.  Ms. Rosado was given information about Chronic Care Management services today including:  CCM service includes personalized support from designated clinical staff supervised by her physician, including individualized plan of care and coordination with other care providers 24/7 contact phone numbers for assistance for urgent and routine care needs. Service will only be billed when office clinical staff spend 20 minutes or more in a month to coordinate care. Only one practitioner may furnish and bill the service in a calendar month. The patient may stop CCM services at any time (effective at the end of the month) by phone call to the office staff. The patient is responsible for co-pay (up to 20% after annual deductible is met) if co-pay is required by the individual health plan.   Patient agreed to services and verbal consent obtained.   Follow up plan: Telephone appointment with care management team member scheduled for: 06/21/2021  Julian Hy, [facility]  Direct Dial: [phone]

## 2021-06-10 NOTE — Telephone Encounter (Signed)
Copied from [place] [id number]. Topic: General - Other >> Jun 10, 2021 10:14 AM Celene Kras wrote: Reason for CRM: Pt called and is requesting to have her lab results. Please advise.

## 2021-06-11 DIAGNOSIS — D631 Anemia in chronic kidney disease: Secondary | ICD-10-CM | POA: Diagnosis not present

## 2021-06-11 DIAGNOSIS — Z86718 Personal history of other venous thrombosis and embolism: Secondary | ICD-10-CM | POA: Diagnosis not present

## 2021-06-11 DIAGNOSIS — G588 Other specified mononeuropathies: Secondary | ICD-10-CM | POA: Diagnosis not present

## 2021-06-11 DIAGNOSIS — M5033 Other cervical disc degeneration, cervicothoracic region: Secondary | ICD-10-CM | POA: Diagnosis not present

## 2021-06-11 DIAGNOSIS — I129 Hypertensive chronic kidney disease with stage 1 through stage 4 chronic kidney disease, or unspecified chronic kidney disease: Secondary | ICD-10-CM | POA: Diagnosis not present

## 2021-06-11 DIAGNOSIS — N179 Acute kidney failure, unspecified: Secondary | ICD-10-CM | POA: Diagnosis not present

## 2021-06-11 DIAGNOSIS — M159 Polyosteoarthritis, unspecified: Secondary | ICD-10-CM | POA: Diagnosis not present

## 2021-06-11 DIAGNOSIS — Z96642 Presence of left artificial hip joint: Secondary | ICD-10-CM | POA: Diagnosis not present

## 2021-06-11 DIAGNOSIS — E1140 Type 2 diabetes mellitus with diabetic neuropathy, unspecified: Secondary | ICD-10-CM | POA: Diagnosis not present

## 2021-06-11 DIAGNOSIS — E441 Mild protein-calorie malnutrition: Secondary | ICD-10-CM | POA: Diagnosis not present

## 2021-06-11 DIAGNOSIS — E1169 Type 2 diabetes mellitus with other specified complication: Secondary | ICD-10-CM | POA: Diagnosis not present

## 2021-06-11 DIAGNOSIS — E039 Hypothyroidism, unspecified: Secondary | ICD-10-CM | POA: Diagnosis not present

## 2021-06-11 DIAGNOSIS — T8450XD Infection and inflammatory reaction due to unspecified internal joint prosthesis, subsequent encounter: Secondary | ICD-10-CM | POA: Diagnosis not present

## 2021-06-11 DIAGNOSIS — E1122 Type 2 diabetes mellitus with diabetic chronic kidney disease: Secondary | ICD-10-CM | POA: Diagnosis not present

## 2021-06-11 DIAGNOSIS — E785 Hyperlipidemia, unspecified: Secondary | ICD-10-CM | POA: Diagnosis not present

## 2021-06-11 DIAGNOSIS — I890 Lymphedema, not elsewhere classified: Secondary | ICD-10-CM | POA: Diagnosis not present

## 2021-06-11 DIAGNOSIS — I272 Pulmonary hypertension, unspecified: Secondary | ICD-10-CM | POA: Diagnosis not present

## 2021-06-11 DIAGNOSIS — G4733 Obstructive sleep apnea (adult) (pediatric): Secondary | ICD-10-CM | POA: Diagnosis not present

## 2021-06-11 DIAGNOSIS — Z96652 Presence of left artificial knee joint: Secondary | ICD-10-CM | POA: Diagnosis not present

## 2021-06-11 DIAGNOSIS — E538 Deficiency of other specified B group vitamins: Secondary | ICD-10-CM | POA: Diagnosis not present

## 2021-06-11 DIAGNOSIS — N183 Chronic kidney disease, stage 3 unspecified: Secondary | ICD-10-CM | POA: Diagnosis not present

## 2021-06-11 DIAGNOSIS — Z9181 History of falling: Secondary | ICD-10-CM | POA: Diagnosis not present

## 2021-06-12 DIAGNOSIS — R040 Epistaxis: Secondary | ICD-10-CM | POA: Diagnosis not present

## 2021-06-12 DIAGNOSIS — J340 Abscess, furuncle and carbuncle of nose: Secondary | ICD-10-CM | POA: Diagnosis not present

## 2021-06-13 DIAGNOSIS — M5033 Other cervical disc degeneration, cervicothoracic region: Secondary | ICD-10-CM | POA: Diagnosis not present

## 2021-06-13 DIAGNOSIS — I890 Lymphedema, not elsewhere classified: Secondary | ICD-10-CM | POA: Diagnosis not present

## 2021-06-13 DIAGNOSIS — Z1211 Encounter for screening for malignant neoplasm of colon: Secondary | ICD-10-CM | POA: Diagnosis not present

## 2021-06-13 DIAGNOSIS — M1990 Unspecified osteoarthritis, unspecified site: Secondary | ICD-10-CM | POA: Diagnosis not present

## 2021-06-13 DIAGNOSIS — N1831 Chronic kidney disease, stage 3a: Secondary | ICD-10-CM | POA: Diagnosis not present

## 2021-06-13 DIAGNOSIS — M21372 Foot drop, left foot: Secondary | ICD-10-CM | POA: Diagnosis not present

## 2021-06-13 DIAGNOSIS — R040 Epistaxis: Secondary | ICD-10-CM | POA: Diagnosis not present

## 2021-06-19 LAB — COLOGUARD: Cologuard: NEGATIVE

## 2021-06-20 DIAGNOSIS — R040 Epistaxis: Secondary | ICD-10-CM | POA: Diagnosis not present

## 2021-06-21 ENCOUNTER — Ambulatory Visit (INDEPENDENT_AMBULATORY_CARE_PROVIDER_SITE_OTHER): Payer: Medicare Other | Admitting: *Deleted

## 2021-06-21 DIAGNOSIS — I10 Essential (primary) hypertension: Secondary | ICD-10-CM

## 2021-06-21 DIAGNOSIS — E1140 Type 2 diabetes mellitus with diabetic neuropathy, unspecified: Secondary | ICD-10-CM

## 2021-06-21 DIAGNOSIS — L03115 Cellulitis of right lower limb: Secondary | ICD-10-CM | POA: Diagnosis not present

## 2021-06-24 ENCOUNTER — Encounter (INDEPENDENT_AMBULATORY_CARE_PROVIDER_SITE_OTHER): Payer: BC Managed Care – PPO

## 2021-06-24 ENCOUNTER — Ambulatory Visit (INDEPENDENT_AMBULATORY_CARE_PROVIDER_SITE_OTHER): Payer: BC Managed Care – PPO | Admitting: Vascular Surgery

## 2021-06-24 NOTE — Chronic Care Management (AMB) (Signed)
Chronic Care Management    Clinical Social Work Note  06/24/2021 Name: Becky Trevino MRN: [id number] DOB: May 11, 1946  [name] is a 75 y.o. year old female who is a primary care patient of [name], MD. The CCM team was consulted to assist the patient with chronic disease management and/or care coordination needs related to: [employer] .   Engaged with patient by telephone for initial visit in response to provider referral for social work chronic care management and care coordination services.   Consent to Services:  The patient was given the following information about Chronic Care Management services today, agreed to services, and gave verbal consent: 1. CCM service includes personalized support from designated clinical staff supervised by the primary care provider, including individualized plan of care and coordination with other care providers 2. 24/7 contact phone numbers for assistance for urgent and routine care needs. 3. Service will only be billed when office clinical staff spend 20 minutes or more in a month to coordinate care. 4. Only one practitioner may furnish and bill the service in a calendar month. 5.The patient may stop CCM services at any time (effective at the end of the month) by phone call to the office staff. 6. The patient will be responsible for cost sharing (co-pay) of up to 20% of the service fee (after annual deductible is met). Patient agreed to services and consent obtained.  Patient agreed to services and consent obtained.   Assessment: Review of patient past medical history, allergies, medications, and health status, including review of relevant consultants reports was performed today as part of a comprehensive evaluation and provision of chronic care management and care coordination services.     SDOH (Social Determinants of Health) assessments and interventions performed:    Advanced Directives Status: Not addressed in this  encounter.  CCM Care Plan  Allergies  Allergen Reactions   Lisinopril Swelling    Outpatient Encounter Medications as of 06/21/2021  Medication Sig   acetaminophen (TYLENOL) 650 MG CR tablet Take 650-1,300 mg by mouth 2 (two) times daily as needed for pain.   ASPIRIN LOW DOSE 81 MG EC tablet TAKE 1 TABLET BY MOUTH EVERY DAY   atorvastatin (LIPITOR) 40 MG tablet Take 1 tablet (40 mg total) by mouth every evening.   Calcium Carbonate-Vitamin D 600-400 MG-UNIT tablet Take 1 tablet by mouth 2 (two) times daily.   doxycycline (VIBRAMYCIN) 100 MG capsule Take 100 mg by mouth 2 (two) times daily.   fluticasone (FLONASE) 50 MCG/ACT nasal spray SPRAY 2 SPRAYS INTO EACH NOSTRIL EVERY DAY   ketoconazole (NIZORAL) 2 % cream Apply 1 application topically 2 (two) times daily as needed (rash).    latanoprost (XALATAN) 0.005 % ophthalmic solution SMARTSIG:In Eye(s)   Magnesium Oxide 500 MG CAPS Take 500 mg by mouth daily.    metoprolol succinate (TOPROL-XL) 25 MG 24 hr tablet Take 1 tablet (25 mg total) by mouth daily.   mirabegron ER (MYRBETRIQ) 50 MG TB24 tablet Take 1 tablet (50 mg total) by mouth daily.   oxyCODONE (OXY IR/ROXICODONE) 5 MG immediate release tablet Take 5 mg by mouth every 4 (four) hours as needed.   polyethylene glycol (MIRALAX / GLYCOLAX) 17 g packet Take 17 g by mouth daily.   potassium chloride (KLOR-CON) 10 MEQ tablet Take 10 mEq by mouth daily.   SIMBRINZA 1-0.2 % SUSP Place 1 drop into both eyes 2 (two) times daily.   traZODone (DESYREL) 50 MG tablet Take 0.5-1 tablets (25-50  mg total) by mouth at bedtime as needed for sleep.   No facility-administered encounter medications on file as of 06/21/2021.    Patient Active Problem List   Diagnosis Date Noted   Atherosclerosis of aorta ([place]) 06/07/2021   Anasarca    Hypothyroidism    SVT (supraventricular tachycardia) (HCC)    Acute kidney injury superimposed on CKD (HCC)    Hypotension    Class 2 obesity without serious  comorbidity with body mass index (BMI) of 39.0 to 39.9 in adult    Thrombocytopenia (HCC)    Mild protein-calorie malnutrition (HCC)    Hyperbilirubinemia    Injury of left femoral nerve 08/28/2020   History of DVT (deep vein thrombosis) 06/25/2020   Lymphedema 06/25/2020   History of pelvic hematoma 02/10/2020   Foot ulcer, right, limited to breakdown of skin ([place]) 12/02/2019   MRSA bacteremia 10/30/2019   Chronic venous hypertension due to deep vein thrombosis (DVT) 10/30/2019   Hematoma    Normocytic anemia 08/07/2019   Chronic infection of right knee ([place]) 11/25/2018   Infection and inflammatory reaction due to internal joint prosthesis, subsequent encounter 01/25/2018   Infection of total right knee replacement ([place]) 11/19/2017   Type 2 diabetes mellitus with hyperlipidemia ([place]) 11/06/2016   Primary osteoarthritis of knee 03/11/2016   Primary osteoarthritis of left hip 12/11/2015   Benign essential HTN 04/21/2015   Edema leg 04/21/2015   Chronic kidney disease (CKD), stage III (moderate) ([place]) 04/21/2015   Chronic venous insufficiency 04/21/2015   DDD (degenerative disc disease), cervical 04/21/2015   Diabetes mellitus with renal manifestation ([place]) 04/21/2015   Hyperlipidemia 04/21/2015   Bladder cystocele 04/21/2015   Urinary incontinence 04/21/2015   Leg varices 04/21/2015   Insomnia 04/21/2015   Eczema intertrigo 04/21/2015   Obesity, Class III, BMI 40-49.9 (morbid obesity) ([place]) 04/21/2015   OSA (obstructive sleep apnea) 04/21/2015   Menopausal and perimenopausal disorder 04/21/2015   Supraventricular premature beats 04/21/2015   Osteoarthritis of multiple joints 04/21/2015   H/O Spinal surgery 11/14/2013   Detrusor muscle hypertonia 11/07/2013    Conditions to be addressed/monitored: Limited social support Lower leg DVT (deep venous thromboembolism), chronic, right (HCC) R26.9 (ICD-10-CM) - Gait difficulty  Care Plan : General Social Work (Adult)  Updates made  by [employer], Darla Lesches, LCSW since 06/24/2021 12:00 AM     Problem: CHL AMB "PATIENT-SPECIFIC PROBLEM"   Note:   CARE PLAN ENTRY (see longitudinal plan of care for additional care plan information)  Current Barriers:  Patient with HTN in need of assistance with connection to community resources  Knowledge deficits and need for support, education and care coordination related to community resources support  Financial constraints related to need for in home care  Clinical Goal(s)   Patient will continue to follow up with the current in home aid service already in place  Interventions provided by LCSW:  Assessed patient's care coordination needs related to in home support and discussed ongoing care management follow up  Confirmed that patient has a privately paid aid in the home daily from 9-3 pm however was looking to identify any additional more affordable options for care(assists with light house keeping, personal care and transportation to medical appointments) Provided patient with information about additional private duty options as well as possible referral to the case management program through [employer] declined this option stating that she would continue with current aid and will consider referral to the [facility] in the future Patient interviewed and appropriate  assessments performed Active listening / Reflection utilized  Emotional Support Provided   Patient Self Care Activities & Deficits:  Patient is unable to independently navigate community resource options without care coordination support  Acknowledges deficits and is motivated to resolve concern  Unable to perform ADLs independently Unable to perform IADLs independently Ability for insight Independent living In home care currently in place  Initial goal documentation        Follow Up Plan: No additional community resource needs at this time, patient to continue to utilize existing in  home care services at this time.      Elliot Gurney, [place] Administrator, [facility] [phone]

## 2021-06-24 NOTE — Patient Instructions (Signed)
Visit Information  PATIENT GOALS:   Goals Addressed             This Visit's Progress    Find Help in My Community       Timeframe:  Short-Term Goal Priority:  Low Start Date:      06/21/21                       Expected End Date:     06/21/21                  Follow Up Date    - follow-up on any referrals for help I am given - think ahead to make sure my need does not become an emergency - have a back-up plan - make a list of family or friends that I can call -please call this social worker with any additional community resource needs    Why is this important?   Knowing how and where to find help for yourself or family in your neighborhood and community is an important skill.  You will want to take some steps to learn how.    Notes:         Becky Trevino was given information about Care Management services by the embedded care coordination team including:  Care Management services include personalized support from designated clinical staff supervised by her physician, including individualized plan of care and coordination with other care providers 24/7 contact phone numbers for assistance for urgent and routine care needs. The patient may stop CCM services at any time (effective at the end of the month) by phone call to the office staff.  Patient agreed to services and verbal consent obtained.   The patient verbalized understanding of instructions, educational materials, and care plan provided today and declined offer to receive copy of patient instructions, educational materials, and care plan.   No further follow up required: patient to continue with existing in home care already in place  [place], [facility] [phone]

## 2021-06-26 DIAGNOSIS — E1140 Type 2 diabetes mellitus with diabetic neuropathy, unspecified: Secondary | ICD-10-CM | POA: Diagnosis not present

## 2021-06-26 DIAGNOSIS — B351 Tinea unguium: Secondary | ICD-10-CM | POA: Diagnosis not present

## 2021-06-26 DIAGNOSIS — E1142 Type 2 diabetes mellitus with diabetic polyneuropathy: Secondary | ICD-10-CM | POA: Diagnosis not present

## 2021-06-26 DIAGNOSIS — R2681 Unsteadiness on feet: Secondary | ICD-10-CM | POA: Diagnosis not present

## 2021-06-26 DIAGNOSIS — L03115 Cellulitis of right lower limb: Secondary | ICD-10-CM | POA: Diagnosis not present

## 2021-06-27 NOTE — Progress Notes (Signed)
06/28/2021 6:59 PM   Becky Trevino 01/26/1946 [id number]  Referring provider: [name], MD [address] [place],  [place] [address]  Chief Complaint  Patient presents with   Over Active Bladder    Urological history: 1. Mixed incontinence -contributing factors of age, obesity, cystocele, diuretics, DM and DDD -managed with Myrbetriq 50 mg daily -PVR 0 mL  2. Sleep apnea -Noncompliant with CPAP machine   HPI: Becky Trevino is a 75 y.o. female who presents today for 6 month follow up.   She is experiencing 1-7 daytime urination (stable), 1-2 nocturia (stable), mild urgency (stable), stress incontinence, leakage occurring 1-2 times weekly(stable) and she is wearing 1 absorbent depends daily (stable).    BP 135/82  PVR 0 mL   UA negative.   PMH: Past Medical History:  Diagnosis Date   Cervical spondylosis    Chronic kidney disease    Stage 3   DDD (degenerative disc disease), cervical    [facility]   Diabetes mellitus without complication (HCC)    Diverticulosis    Hyperlipidemia    Hypertension    Intertrigo    Leg cramps    Leg varices    Obesity    OSA (obstructive sleep apnea)    Symptomatic menopausal or female climacteric states    Syncope     Surgical History: Past Surgical History:  Procedure Laterality Date   ABDOMINAL HYSTERECTOMY  1993   Total   BUNIONECTOMY Bilateral 1993   I & D KNEE WITH POLY EXCHANGE Right 11/19/2017   Procedure: RIGHT KNEE POLY EXCHANGE WITH IRRIGATION AND DEBRIDEMENT;  Surgeon: Hessie Knows, MD;  Location: [facility] OR[facility];  Service: Orthopedics;  Laterality: Right;   JOINT REPLACEMENT     bilateral knee   LAMINECTOMY  11/14/2013    Cervical Fusion , [facility], Dr. Delilah Shan   LASER ABLATION Bilateral 07/29/2012   Dr. Lucky Cowboy   LOWER EXTREMITY ANGIOGRAPHY Right 11/02/2019   Procedure: Lower Extremity Angiography;  Surgeon: Katha Cabal, MD;  Location: [facility];  Service: Cardiovascular;   Laterality: Right;   ORIF ANKLE FRACTURE Right 09/02/2019   Procedure: OPEN REDUCTION INTERNAL FIXATION (ORIF) ANKLE FRACTURE BIMALLEOLAR;  Surgeon: [name], DPM;  Location: [facility];  Service: Podiatry;  Laterality: Right;   PERIPHERAL VASCULAR THROMBECTOMY Right 03/02/2020   Procedure: PERIPHERAL VASCULAR THROMBECTOMY;  Surgeon: Algernon Huxley, MD;  Location: [place] [facility];  Service: Cardiovascular;  Laterality: Right;   REPLACEMENT TOTAL KNEE Left 03/2010   Dr. Rudene Christians   TOTAL HIP ARTHROPLASTY Left 12/11/2015   Procedure: TOTAL HIP ARTHROPLASTY ANTERIOR APPROACH;  Surgeon: Hessie Knows, MD;  Location: [facility];  Service: Orthopedics;  Laterality: Left;   TOTAL KNEE ARTHROPLASTY Right 03/11/2016   Procedure: TOTAL KNEE ARTHROPLASTY;  Surgeon: Hessie Knows, MD;  Location: [facility];  Service: Orthopedics;  Laterality: Right;    Home Medications:  Allergies as of 06/28/2021       Reactions   Lisinopril Swelling        Medication List        Accurate as of June 28, 2021 11:59 PM. If you have any questions, ask your nurse or doctor.          acetaminophen 650 MG CR tablet Commonly known as: TYLENOL Take 650-1,300 mg by mouth 2 (two) times daily as needed for pain.   Aspirin Low Dose 81 MG EC tablet Generic drug: aspirin TAKE 1 TABLET BY MOUTH EVERY DAY  atorvastatin 40 MG tablet Commonly known as: LIPITOR Take 1 tablet (40 mg total) by mouth every evening.   Calcium Carbonate-Vitamin D 600-400 MG-UNIT tablet Take 1 tablet by mouth 2 (two) times daily.   doxycycline 100 MG capsule Commonly known as: VIBRAMYCIN Take 100 mg by mouth 2 (two) times daily.   fluticasone 50 MCG/ACT nasal spray Commonly known as: FLONASE SPRAY 2 SPRAYS INTO EACH NOSTRIL EVERY DAY   ketoconazole 2 % cream Commonly known as: NIZORAL Apply 1 application topically 2 (two) times daily as needed (rash).   latanoprost 0.005 % ophthalmic solution Commonly known as:  XALATAN SMARTSIG:In Eye(s)   levothyroxine 25 MCG tablet Commonly known as: SYNTHROID TAKE 1 TABLET (25 MCG TOTAL) BY MOUTH DAILY AT 6 (SIX) AM.   Magnesium Oxide 500 MG Caps Take 500 mg by mouth daily.   metoprolol succinate 25 MG 24 hr tablet Commonly known as: TOPROL-XL Take 1 tablet (25 mg total) by mouth daily.   mirabegron ER 50 MG Tb24 tablet Commonly known as: Myrbetriq Take 1 tablet (50 mg total) by mouth daily.   oxyCODONE 5 MG immediate release tablet Commonly known as: Oxy IR/ROXICODONE Take 5 mg by mouth every 4 (four) hours as needed.   polyethylene glycol 17 g packet Commonly known as: MIRALAX / GLYCOLAX Take 17 g by mouth daily.   potassium chloride 10 MEQ tablet Commonly known as: KLOR-CON Take 10 mEq by mouth daily.   Simbrinza 1-0.2 % Susp Generic drug: Brinzolamide-Brimonidine Place 1 drop into both eyes 2 (two) times daily.   torsemide 20 MG tablet Commonly known as: DEMADEX Take 1 tablet (20 mg total) by mouth daily.   traZODone 50 MG tablet Commonly known as: DESYREL Take 0.5-1 tablets (25-50 mg total) by mouth at bedtime as needed for sleep.        Allergies:  Allergies  Allergen Reactions   Lisinopril Swelling    Family History: Family History  Problem Relation Age of Onset   Diabetes Mother    Hyperlipidemia Mother    Hypertension Mother    Diabetes Father    Hyperlipidemia Father    Hypertension Father    Obesity Father    Hypertension Sister    Hyperlipidemia Sister    Hyperlipidemia Sister    Breast cancer Neg Hx     Social History:  reports that she has never smoked. She has never used smokeless tobacco. She reports that she does not drink alcohol and does not use drugs.  ROS: For pertinent review of systems please refer to history of present illness  Physical Exam: BP 135/82   Pulse 71   Ht 5' 11"  (1.803 m)   Wt (!) 320 lb (145.2 kg)   BMI 44.63 kg/m   Constitutional:  Well nourished. Alert and oriented, No  acute distress. HEENT: [place] AT, mask in place.  Trachea midline Cardiovascular: No clubbing, cyanosis, or edema. Respiratory: Normal respiratory effort, no increased work of breathing. Neurologic: Grossly intact, no focal deficits, non-ambulatory  Psychiatric: Normal mood and affect.    Laboratory Data: Component     Latest Ref Rng & Units 06/07/2021  PTH, Intact     16 - 77 pg/mL 44   Component     Latest Ref Rng & Units 06/07/2021          Glucose     65 - 99 mg/dL 96  BUN     7 - 25 mg/dL 11  Creatinine     0.60 - 1.00  mg/dL 1.12 (H)  eGFR     > OR = 60 mL/min/1.21m 51 (L)  BUN/Creatinine Ratio     6 - 22 (calc) 10  Sodium     135 - 146 mmol/L 140  Potassium     3.5 - 5.3 mmol/L 4.4  Chloride     98 - 110 mmol/L 103  CO2     20 - 32 mmol/L 30  Calcium     8.6 - 10.4 mg/dL 10.1  Total Protein     6.1 - 8.1 g/dL 8.0  Albumin MSPROF     3.6 - 5.1 g/dL 4.4  Globulin     1.9 - 3.7 g/dL (calc) 3.6  AG Ratio     1.0 - 2.5 (calc) 1.2  Total Bilirubin     0.2 - 1.2 mg/dL 0.8  Alkaline phosphatase (APISO)     37 - 153 U/L 111  AST     10 - 35 U/L 21  ALT     6 - 29 U/L 15   Component     Latest Ref Rng & Units 06/07/2021  Hemoglobin A1C     4.0 - 5.6 % 5.5  Urinalysis Component     Latest Ref Rng & Units 06/28/2021  Specific Gravity, UA     1.005 - 1.030 >1.030 (H)  pH, UA     5.0 - 7.5 5.0  Color, UA     Yellow Yellow  Appearance Ur     Clear Clear  Leukocytes,UA     Negative Negative  Protein,UA     Negative/Trace Negative  Glucose, UA     NEGATIVE mg/dL Negative  Ketones, UA     Negative Trace (A)  RBC, UA     Negative Negative  Bilirubin, UA     Negative Negative  Urobilinogen, Ur     0.2 - 1.0 mg/dL 0.2  Nitrite, UA     Negative Negative  Microscopic Examination      See below:   Component     Latest Ref Rng & Units 06/28/2021  WBC, UA     0 - 5 WBC/hpf None seen  RBC     3.80 - 5.10 Million/uL 0-2  Epithelial Cells (non renal)      0 - 10 /hpf 0-10  Mucus, UA     Not Estab. Present (A)  Bacteria, UA     NONE SEEN Few (A)   I have reviewed the labs.  Pertinent Imaging  Results for Becky Trevino, Becky Trevino(MRN [id number] as of 06/28/2021 10:57  Ref. Range 06/28/2021 10:31  Scan Result Unknown 0     Assessment & Plan:    1. Mixed incontinence -At goal on Myrbetriq 50 mg daily -She has successfully weaned off Tofranil -UA is benign today -PVR is now  2. Nocturia -Working with ENT to acquire more comfortable CPAP machine  Return in about 1 year (around 06/28/2022) for PVR and OAB questionnaire.  These notes generated with voice recognition software. I apologize for typographical errors.  SZara Council PA-C  [facility] [address]SArgyleBPaxton [place] [phone]

## 2021-06-28 ENCOUNTER — Other Ambulatory Visit: Payer: Self-pay

## 2021-06-28 ENCOUNTER — Ambulatory Visit: Payer: Medicare Other | Admitting: Urology

## 2021-06-28 ENCOUNTER — Encounter: Payer: Self-pay | Admitting: Urology

## 2021-06-28 VITALS — BP 135/82 | HR 71 | Ht 71.0 in | Wt 320.0 lb

## 2021-06-28 DIAGNOSIS — N3946 Mixed incontinence: Secondary | ICD-10-CM | POA: Diagnosis not present

## 2021-06-28 DIAGNOSIS — L03115 Cellulitis of right lower limb: Secondary | ICD-10-CM | POA: Diagnosis not present

## 2021-06-28 DIAGNOSIS — R351 Nocturia: Secondary | ICD-10-CM

## 2021-06-28 LAB — MICROSCOPIC EXAMINATION: WBC, UA: NONE SEEN /hpf (ref 0–5)

## 2021-06-28 LAB — URINALYSIS, COMPLETE
Bilirubin, UA: NEGATIVE
Glucose, UA: NEGATIVE
Leukocytes,UA: NEGATIVE
Nitrite, UA: NEGATIVE
Protein,UA: NEGATIVE
RBC, UA: NEGATIVE
Specific Gravity, UA: >1.03 — ABNORMAL HIGH (ref 1.005–1.030)
Urobilinogen, Ur: 0.2 mg/dL (ref 0.2–1.0)
pH, UA: 5 (ref 5.0–7.5)

## 2021-06-28 LAB — BLADDER SCAN AMB NON-IMAGING: Scan Result: 0

## 2021-06-28 MED ORDER — MIRABEGRON ER 50 MG PO TB24: 50.0000 mg | ORAL_TABLET | Freq: Every day | ORAL | 3 refills | Status: DC

## 2021-07-01 ENCOUNTER — Other Ambulatory Visit: Payer: Self-pay | Admitting: Family Medicine

## 2021-07-01 DIAGNOSIS — I10 Essential (primary) hypertension: Secondary | ICD-10-CM

## 2021-07-02 ENCOUNTER — Telehealth: Payer: Self-pay | Admitting: Oncology

## 2021-07-02 ENCOUNTER — Telehealth: Payer: Self-pay

## 2021-07-02 NOTE — Telephone Encounter (Signed)
07/02/2021 Spoke w/ pt and informed her that appt on 10/21 will now be with an NP ([place]) per Dr. Tasia Catchings due to issues with her schedule. Pt was agreeable to this change SRW

## 2021-07-02 NOTE — Telephone Encounter (Signed)
Copied from [place] [id number]. Topic: General - Other >> Jul 02, 2021  3:08 PM Becky Trevino wrote: Reason for CRM: pt called in to update Alexio Sroka with some information, pt says that she is expecting a call from her. Pt says the supply location's name is,   [facility].  Address: [address] [place] [address]

## 2021-07-04 ENCOUNTER — Other Ambulatory Visit: Payer: Self-pay | Admitting: Urology

## 2021-07-04 DIAGNOSIS — R351 Nocturia: Secondary | ICD-10-CM

## 2021-07-04 DIAGNOSIS — N3946 Mixed incontinence: Secondary | ICD-10-CM

## 2021-07-04 DIAGNOSIS — G4733 Obstructive sleep apnea (adult) (pediatric): Secondary | ICD-10-CM | POA: Diagnosis not present

## 2021-07-05 ENCOUNTER — Inpatient Hospital Stay: Payer: Medicare Other | Attending: Oncology

## 2021-07-05 ENCOUNTER — Other Ambulatory Visit: Payer: Self-pay

## 2021-07-05 ENCOUNTER — Encounter: Payer: Self-pay | Admitting: Oncology

## 2021-07-05 ENCOUNTER — Inpatient Hospital Stay: Payer: Medicare Other | Admitting: Oncology

## 2021-07-05 VITALS — BP 140/75 | HR 63 | Temp 96.5°F

## 2021-07-05 DIAGNOSIS — T8450XD Infection and inflammatory reaction due to unspecified internal joint prosthesis, subsequent encounter: Secondary | ICD-10-CM

## 2021-07-05 DIAGNOSIS — N189 Chronic kidney disease, unspecified: Secondary | ICD-10-CM | POA: Insufficient documentation

## 2021-07-05 DIAGNOSIS — Z86718 Personal history of other venous thrombosis and embolism: Secondary | ICD-10-CM | POA: Diagnosis not present

## 2021-07-05 DIAGNOSIS — Z95828 Presence of other vascular implants and grafts: Secondary | ICD-10-CM

## 2021-07-05 LAB — CBC WITH DIFFERENTIAL/PLATELET
Abs Immature Granulocytes: 0.01 10*3/uL (ref 0.00–0.07)
Basophils Absolute: 0 10*3/uL (ref 0.0–0.1)
Basophils Relative: 1 %
Eosinophils Absolute: 0.2 10*3/uL (ref 0.0–0.5)
Eosinophils Relative: 4 %
HCT: 40.6 % (ref 36.0–46.0)
Hemoglobin: 12.9 g/dL (ref 12.0–15.0)
Immature Granulocytes: 0 %
Lymphocytes Relative: 22 %
Lymphs Abs: 1 10*3/uL (ref 0.7–4.0)
MCH: 30.6 pg (ref 26.0–34.0)
MCHC: 31.8 g/dL (ref 30.0–36.0)
MCV: 96.4 fL (ref 80.0–100.0)
Monocytes Absolute: 0.4 10*3/uL (ref 0.1–1.0)
Monocytes Relative: 9 %
Neutro Abs: 2.9 10*3/uL (ref 1.7–7.7)
Neutrophils Relative %: 64 %
Platelets: 178 10*3/uL (ref 150–400)
RBC: 4.21 MIL/uL (ref 3.87–5.11)
RDW: 15.7 % — ABNORMAL HIGH (ref 11.5–15.5)
WBC: 4.5 10*3/uL (ref 4.0–10.5)
nRBC: 0 % (ref 0.0–0.2)

## 2021-07-05 LAB — COMPREHENSIVE METABOLIC PANEL
ALT: 18 U/L (ref 0–44)
AST: 25 U/L (ref 15–41)
Albumin: 4.3 g/dL (ref 3.5–5.0)
Alkaline Phosphatase: 109 U/L (ref 38–126)
Anion gap: 9 (ref 5–15)
BUN: 21 mg/dL (ref 8–23)
CO2: 30 mmol/L (ref 22–32)
Calcium: 9.6 mg/dL (ref 8.9–10.3)
Chloride: 98 mmol/L (ref 98–111)
Creatinine, Ser: 1.29 mg/dL — ABNORMAL HIGH (ref 0.44–1.00)
GFR, Estimated: 43 mL/min — ABNORMAL LOW (ref >60)
Glucose, Bld: 101 mg/dL — ABNORMAL HIGH (ref 70–99)
Potassium: 4 mmol/L (ref 3.5–5.1)
Sodium: 137 mmol/L (ref 135–145)
Total Bilirubin: 1 mg/dL (ref 0.3–1.2)
Total Protein: 8.6 g/dL — ABNORMAL HIGH (ref 6.5–8.1)

## 2021-07-05 NOTE — Progress Notes (Signed)
Hematology/Oncology Follow Up Note [facility]  [phone] [phone]  Patient Care Team: [name], MD as PCP - General (Family Medicine) Yolonda Kida, MD as Consulting Physician (Cardiology) Troxler, Adele Schilder (Inactive) as Consulting Physician (Podiatry) Lorelee Cover., MD as Consulting Physician (Ophthalmology) Hessie Knows, MD as Consulting Physician (Orthopedic Surgery) Murrell Redden, MD as Consulting Physician (Urology) Cira Servant, DO as Consulting Physician (Rehabilitation) [name], MD as Consulting Physician (Infectious Diseases) Vern Claude, [place] as Social Worker   Name of the patient: Becky Trevino  [id number]  08-Jan-1946   REASON FOR VISIT  follow-up for DVT and hematoma  Pertinent hematology history 75 y.o. female with past medical history listed as below presents for follow-up of DVT. She was admitted from 10/27/2019-11/07/2019 for right lower extremity cellulitis/wound infection, bacteremia with group B streptococcus, right lower extremity DVT. # Patient had ankle fracture status post a recent right ankle ORIF on 09/02/2019 by Dr. Araceli Bouche.  Patient has a wound VAC She was noticed to have right lower extremity swelling, mild erythema and warmth with associated tenderness. 11/24/2019 US venous right lower extremity showed a DVT within the right popliteal vein, with superficial thrombus seen at the saphenofemoral junction. Patient tells me that she was diagnosed with "blood clots" in August 2020.  At that time she had pain and swelling for about a week and went to ER for evaluation.    04/29/2019 ultrasound was negative for DVT, only showed a superficial thrombophlebitis involving a branch of the great saphenous vein in the proximal thigh.  She was fine to have right Baker's cyst.  Patient denies any immobilization factors contributing to this event.  Patient had elevated D-dimer at that  time. Patient was started on Xarelto-starter kit by emergency room.  Patient was recommended to have US aorta iliac Doppler by primary care provider Dr. Ancil Boozer and I do not see the study was done.  Xarelto was continued outpatient for thrombophlebitis..  Patient was hospitalized again at the end of November 2020 due to syncope episodes which likely due to orthostatic hypotension.  Patient was noted to have anemia, CT abdomen pelvis to evaluate after trauma, showed left gluteal hematoma without signs of extravasation.  Xarelto was held during the hospitalization.  Saw cardiology [facility].  Patient was seen by Dr. Ancil Boozer on 08/23/2019 and the Xarelto was stopped.   09/02/2019 patient slipped and obtained right ankle fracture.  Status post ORIF procedure by Dr. Luana Shu.  Postop patient was recommended to start on Lovenox 40 mg daily injections prophylactically until her presentation to emergency room in February.  At that point, she was found to have a provoked right lower extremity DVT and she was seen by me during that admission.  Patient was recommended to start therapeutic Lovenox.  During the same hospitalization, patient developed spontaneous iliopsoas hematoma and therapeutic Lovenox was discontinued. Patient had IVC filter placed on 11/02/2019.  Patient was recommended to have outpatient follow-up with me. Patient was recently seen by Dr. Ancil Boozer and was found to have worsening of right lower extremity swelling, Doppler ultrasound was obtained on 02/03/2020 which was positive for DVT in the right lower extremity, increased clot burden in the right lower extremity since 10/27/2019.  Thrombus extending from the right common femoral vein to the right calf.  Patient was referred back to me for evaluation and management.  Currently off anticoagulation due to recurrent hematoma.  IVC filter was placed.  No plan for filter retrieval due  to risk of recurrent lower extremity thrombosis. Negative antiphospholipid  syndrome antibodies, prothrombin gene mutation, factor V Leiden mutation.   INTERVAL HISTORY Patient presents with above history for routine follow-up for DVT.  She is currently off her anticoagulation due to recurrent hematoma.  She has an IVC filter and is followed by Dr. Lucky Cowboy and will see him next month.  Uses lymphatic pump for persistent leg swelling twice per day. 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   She is evaluated in the ED on 05/24/2021 for epistasis which resolved on its own.  Denies any additional nosebleeds.   Overall, reports doing well.  She is in a wheelchair today but uses a walker at home for ambulation.  Reports no pain.  Denies any new symptoms.   Review of Systems  Cardiovascular:  Positive for leg swelling.  Neurological:  Positive for extremity weakness.     Allergies  Allergen Reactions   Lisinopril Swelling     Past Medical History:  Diagnosis Date   Cervical spondylosis    Chronic kidney disease    Stage 3   DDD (degenerative disc disease), cervical    [facility]   Diabetes mellitus without complication (HCC)    Diverticulosis    Hyperlipidemia    Hypertension    Intertrigo    Leg cramps    Leg varices    Obesity    OSA (obstructive sleep apnea)    Symptomatic menopausal or female climacteric states    Syncope      Past Surgical History:  Procedure Laterality Date   ABDOMINAL HYSTERECTOMY  1993   Total   BUNIONECTOMY Bilateral 1993   I & D KNEE WITH POLY EXCHANGE Right 11/19/2017   Procedure: RIGHT KNEE POLY EXCHANGE WITH IRRIGATION AND DEBRIDEMENT;  Surgeon: Hessie Knows, MD;  Location: [facility];  Service: Orthopedics;  Laterality: Right;   JOINT REPLACEMENT     bilateral knee   LAMINECTOMY  11/14/2013    Cervical Fusion , [facility], Dr. Delilah Shan   LASER ABLATION Bilateral 07/29/2012   Dr. Lucky Cowboy   LOWER EXTREMITY ANGIOGRAPHY Right 11/02/2019   Procedure: Lower Extremity  Angiography;  Surgeon: Katha Cabal, MD;  Location: [place] [facility];  Service: Cardiovascular;  Laterality: Right;   ORIF ANKLE FRACTURE Right 09/02/2019   Procedure: OPEN REDUCTION INTERNAL FIXATION (ORIF) ANKLE FRACTURE BIMALLEOLAR;  Surgeon: [name], DPM;  Location: [facility];  Service: Podiatry;  Laterality: Right;   PERIPHERAL VASCULAR THROMBECTOMY Right 03/02/2020   Procedure: PERIPHERAL VASCULAR THROMBECTOMY;  Surgeon: Algernon Huxley, MD;  Location: [place] [facility];  Service: Cardiovascular;  Laterality: Right;   REPLACEMENT TOTAL KNEE Left 03/2010   Dr. Rudene Christians   TOTAL HIP ARTHROPLASTY Left 12/11/2015   Procedure: TOTAL HIP ARTHROPLASTY ANTERIOR APPROACH;  Surgeon: Hessie Knows, MD;  Location: [facility];  Service: Orthopedics;  Laterality: Left;   TOTAL KNEE ARTHROPLASTY Right 03/11/2016   Procedure: TOTAL KNEE ARTHROPLASTY;  Surgeon: Hessie Knows, MD;  Location: [facility];  Service: Orthopedics;  Laterality: Right;    Social History   Socioeconomic History   Marital status: Married    Spouse name: Not on file   Number of children: 1   Years of education: Not on file   Highest education level: Not on file  Occupational History   Not on file  Tobacco Use   Smoking status: Never   Smokeless tobacco: Never  Vaping Use   Vaping Use:  Never used  Substance and Sexual Activity   Alcohol use: No    Alcohol/week: 0.0 standard drinks   Drug use: No   Sexual activity: Yes    Partners: Male  Other Topics Concern   Not on file  Social History Narrative   Married, one adopted grown child    Social Determinants of Health   Financial Resource Strain: Low Risk    Difficulty of Paying Living Expenses: Not hard at all  Food Insecurity: No Food Insecurity   Worried About Charity fundraiser in the Last Year: Never true   Arboriculturist in the Last Year: Never true  Transportation Needs: No Transportation Needs   Lack of Transportation (Medical): No   Lack of  Transportation (Non-Medical): No  Physical Activity: Inactive   Days of Exercise per Week: 0 days   Minutes of Exercise per Session: 30 min  Stress: No Stress Concern Present   Feeling of Stress : Not at all  Social Connections: Socially Integrated   Frequency of Communication with Friends and Family: More than three times a week   Frequency of Social Gatherings with Friends and Family: More than three times a week   Attends Religious Services: More than 4 times per year   Active Member of [employer] or Organizations: Yes   Attends Archivist Meetings: Never   Marital Status: Married  Human resources officer Violence: Not At Risk   Fear of Current or Ex-Partner: No   Emotionally Abused: No   Physically Abused: No   Sexually Abused: No    Family History  Problem Relation Age of Onset   Diabetes Mother    Hyperlipidemia Mother    Hypertension Mother    Diabetes Father    Hyperlipidemia Father    Hypertension Father    Obesity Father    Hypertension Sister    Hyperlipidemia Sister    Hyperlipidemia Sister    Breast cancer Neg Hx      Current Outpatient Medications:    acetaminophen (TYLENOL) 650 MG CR tablet, Take 650-1,300 mg by mouth 2 (two) times daily as needed for pain., Disp: , Rfl:    ASPIRIN LOW DOSE 81 MG EC tablet, TAKE 1 TABLET BY MOUTH EVERY DAY, Disp: 90 tablet, Rfl: 0   atorvastatin (LIPITOR) 40 MG tablet, Take 1 tablet (40 mg total) by mouth every evening., Disp: 90 tablet, Rfl: 1   Calcium Carbonate-Vitamin D 600-400 MG-UNIT tablet, Take 1 tablet by mouth 2 (two) times daily., Disp: 60 tablet, Rfl: 0   doxycycline (VIBRAMYCIN) 100 MG capsule, Take 100 mg by mouth 2 (two) times daily., Disp: , Rfl:    fluticasone (FLONASE) 50 MCG/ACT nasal spray, SPRAY 2 SPRAYS INTO EACH NOSTRIL EVERY DAY, Disp: 48 mL, Rfl: 0   ketoconazole (NIZORAL) 2 % cream, Apply 1 application topically 2 (two) times daily as needed (rash). , Disp: , Rfl:    latanoprost (XALATAN) 0.005 %  ophthalmic solution, SMARTSIG:In Eye(s), Disp: , Rfl:    levothyroxine (SYNTHROID) 25 MCG tablet, TAKE 1 TABLET (25 MCG TOTAL) BY MOUTH DAILY AT 6 (SIX) AM., Disp: 90 tablet, Rfl: 1   Magnesium Oxide 500 MG CAPS, Take 500 mg by mouth daily. , Disp: , Rfl:    metoprolol succinate (TOPROL-XL) 25 MG 24 hr tablet, TAKE 1 TABLET (25 MG TOTAL) BY MOUTH DAILY., Disp: 90 tablet, Rfl: 1   mirabegron ER (MYRBETRIQ) 50 MG TB24 tablet, Take 1 tablet (50 mg total) by mouth daily., Disp:  90 tablet, Rfl: 3   oxyCODONE (OXY IR/ROXICODONE) 5 MG immediate release tablet, Take 5 mg by mouth every 4 (four) hours as needed., Disp: , Rfl:    polyethylene glycol (MIRALAX / GLYCOLAX) 17 g packet, Take 17 g by mouth daily., Disp: 14 each, Rfl: 0   potassium chloride (KLOR-CON) 10 MEQ tablet, Take 10 mEq by mouth daily., Disp: , Rfl:    SIMBRINZA 1-0.2 % SUSP, Place 1 drop into both eyes 2 (two) times daily., Disp: , Rfl:    torsemide (DEMADEX) 20 MG tablet, Take 1 tablet (20 mg total) by mouth daily., Disp: 90 tablet, Rfl: 1   traZODone (DESYREL) 50 MG tablet, Take 0.5-1 tablets (25-50 mg total) by mouth at bedtime as needed for sleep., Disp: 90 tablet, Rfl: 1  Physical exam:  Vitals:   07/05/21 0952  BP: 140/75  Pulse: 63  Temp: (!) 96.5 F (35.8 C)   Physical Exam Constitutional:      Appearance: Normal appearance. She is obese.     Comments: Motorized wheelchair  HENT:     Head: Normocephalic and atraumatic.  Eyes:     Pupils: Pupils are equal, round, and reactive to light.  Cardiovascular:     Rate and Rhythm: Normal rate and regular rhythm.     Heart sounds: Normal heart sounds. No murmur heard. Pulmonary:     Effort: Pulmonary effort is normal.     Breath sounds: Normal breath sounds. No wheezing.  Abdominal:     General: Bowel sounds are normal. There is no distension.     Palpations: Abdomen is soft.     Tenderness: There is no abdominal tenderness.  Musculoskeletal:        General: Swelling  present. Normal range of motion.     Cervical back: Normal range of motion.     Right lower leg: Edema present.  Skin:    General: Skin is warm and dry.     Findings: No rash.  Neurological:     Mental Status: She is alert and oriented to person, place, and time.  Psychiatric:        Judgment: Judgment normal.    CMP Latest Ref Rng & Units 07/05/2021  Glucose 70 - 99 mg/dL 101(H)  BUN 8 - 23 mg/dL 21  Creatinine 0.44 - 1.00 mg/dL 1.29(H)  Sodium 135 - 145 mmol/L 137  Potassium 3.5 - 5.1 mmol/L 4.0  Chloride 98 - 111 mmol/L 98  CO2 22 - 32 mmol/L 30  Calcium 8.9 - 10.3 mg/dL 9.6  Total Protein 6.5 - 8.1 g/dL 8.6(H)  Total Bilirubin 0.3 - 1.2 mg/dL 1.0  Alkaline Phos 38 - 126 U/L 109  AST 15 - 41 U/L 25  ALT 0 - 44 U/L 18   CBC Latest Ref Rng & Units 07/05/2021  WBC 4.0 - 10.5 K/uL 4.5  Hemoglobin 12.0 - 15.0 g/dL 12.9  Hematocrit 36.0 - 46.0 % 40.6  Platelets 150 - 400 K/uL 178    RADIOGRAPHIC STUDIES: I have personally reviewed the radiological images as listed and agreed with the findings in the report. No results found.   Assessment and plan-Mrs. Minahan is a 75 year old female who presents for routine follow-up for recurrent blood clot most recently in her right lower extremity DVT.   History of recurrent blood clots - She has been on and off anticoagulation (Xarelto, Lovenox) since 2020 with several blood clots; provoked right lower extremity DVT (09/02/2019 ) started on Lovenox but developed iliopsoas hematoma-Lovenox stopped.  Had IVC filter placed on 11/02/2019.  Developed worsening right lower extremity swelling (02/03/2020)-imaging showed right lower extremity DVT with increased burden  from previous.  Anticoagulation was never restarted due to recurrent hematoma and IVC filter.  There is no plan for retrieval of IVC filter given risk for recurrent lower extremity thrombus.  Work-up by hematology showed negative antiphospholipid, syndrome antibodies, prothrombin gene  mutation and factor V Leiden mutation.  Labs from today are stable.  No new concerning symptoms today.  She will continue to remain off anticoagulation.  Chronic prosthetic joint infection of the right knee- She is followed by Dr. Ola Spurr and is currently on 100 mg daily doxycycline for suppressive therapy.  Tolerating well.  CKD- Labs from today show creatinine at baseline.  Chronic lower extremity lymphedema- Continue compression stockings and the use of the lymphatic pump twice daily.  Follow up with vascular surgery.  Disposition- Follow-up with Dr. Tasia Catchings in 1 year.  1. History of deep venous thrombosis (DVT) of distal vein of right lower extremity   2. Presence of IVC filter   3. Infection and inflammatory reaction due to internal joint prosthesis, subsequent encounter     No orders of the defined types were placed in this encounter.   I spent 25 minutes dedicated to the care of this patient (face-to-face and non-face-to-face) on the date of the encounter to include what is described in the assessment and plan.  Faythe Casa, NP 07/05/2021 10:47 AM

## 2021-07-09 ENCOUNTER — Telehealth: Payer: Self-pay

## 2021-07-09 NOTE — Telephone Encounter (Signed)
Copied from [place] [id number]. Topic: General - Other >> Jul 08, 2021  5:01 PM Antonieta Iba C wrote: Reason for CRM: Lurline Idol with [facility] is calling in for assistance. She says that they are needing ov notes supporting why pt is in need of the ordered hospital bed.    Make fax, Attention Intake.   Fax# [phone]

## 2021-07-09 NOTE — Telephone Encounter (Signed)
Currently awaiting Dr. Ancil Boozer' updating of note to be able to send.

## 2021-07-11 DIAGNOSIS — J340 Abscess, furuncle and carbuncle of nose: Secondary | ICD-10-CM | POA: Diagnosis not present

## 2021-07-11 DIAGNOSIS — R040 Epistaxis: Secondary | ICD-10-CM | POA: Diagnosis not present

## 2021-07-12 ENCOUNTER — Other Ambulatory Visit (INDEPENDENT_AMBULATORY_CARE_PROVIDER_SITE_OTHER): Payer: Self-pay | Admitting: Vascular Surgery

## 2021-07-12 DIAGNOSIS — I87099 Postthrombotic syndrome with other complications of unspecified lower extremity: Secondary | ICD-10-CM

## 2021-07-12 DIAGNOSIS — I890 Lymphedema, not elsewhere classified: Secondary | ICD-10-CM

## 2021-07-12 DIAGNOSIS — Z86718 Personal history of other venous thrombosis and embolism: Secondary | ICD-10-CM

## 2021-07-13 DIAGNOSIS — M1990 Unspecified osteoarthritis, unspecified site: Secondary | ICD-10-CM | POA: Diagnosis not present

## 2021-07-13 DIAGNOSIS — N1831 Chronic kidney disease, stage 3a: Secondary | ICD-10-CM | POA: Diagnosis not present

## 2021-07-13 DIAGNOSIS — M21372 Foot drop, left foot: Secondary | ICD-10-CM | POA: Diagnosis not present

## 2021-07-13 DIAGNOSIS — I890 Lymphedema, not elsewhere classified: Secondary | ICD-10-CM | POA: Diagnosis not present

## 2021-07-13 DIAGNOSIS — M5033 Other cervical disc degeneration, cervicothoracic region: Secondary | ICD-10-CM | POA: Diagnosis not present

## 2021-07-15 ENCOUNTER — Encounter (INDEPENDENT_AMBULATORY_CARE_PROVIDER_SITE_OTHER): Payer: Medicare Other

## 2021-07-15 ENCOUNTER — Ambulatory Visit (INDEPENDENT_AMBULATORY_CARE_PROVIDER_SITE_OTHER): Payer: Medicare Other | Admitting: Vascular Surgery

## 2021-07-15 ENCOUNTER — Emergency Department: Payer: Medicare Other

## 2021-07-15 ENCOUNTER — Other Ambulatory Visit: Payer: Self-pay

## 2021-07-15 DIAGNOSIS — I890 Lymphedema, not elsewhere classified: Secondary | ICD-10-CM | POA: Diagnosis not present

## 2021-07-15 DIAGNOSIS — E785 Hyperlipidemia, unspecified: Secondary | ICD-10-CM | POA: Diagnosis not present

## 2021-07-15 DIAGNOSIS — E1140 Type 2 diabetes mellitus with diabetic neuropathy, unspecified: Secondary | ICD-10-CM

## 2021-07-15 DIAGNOSIS — Z888 Allergy status to other drugs, medicaments and biological substances status: Secondary | ICD-10-CM | POA: Diagnosis not present

## 2021-07-15 DIAGNOSIS — E1169 Type 2 diabetes mellitus with other specified complication: Secondary | ICD-10-CM | POA: Diagnosis present

## 2021-07-15 DIAGNOSIS — Z86718 Personal history of other venous thrombosis and embolism: Secondary | ICD-10-CM

## 2021-07-15 DIAGNOSIS — G4733 Obstructive sleep apnea (adult) (pediatric): Secondary | ICD-10-CM | POA: Diagnosis not present

## 2021-07-15 DIAGNOSIS — M1611 Unilateral primary osteoarthritis, right hip: Secondary | ICD-10-CM | POA: Diagnosis not present

## 2021-07-15 DIAGNOSIS — M1710 Unilateral primary osteoarthritis, unspecified knee: Secondary | ICD-10-CM | POA: Diagnosis present

## 2021-07-15 DIAGNOSIS — Z79899 Other long term (current) drug therapy: Secondary | ICD-10-CM

## 2021-07-15 DIAGNOSIS — Z981 Arthrodesis status: Secondary | ICD-10-CM

## 2021-07-15 DIAGNOSIS — Z7982 Long term (current) use of aspirin: Secondary | ICD-10-CM

## 2021-07-15 DIAGNOSIS — Z95828 Presence of other vascular implants and grafts: Secondary | ICD-10-CM

## 2021-07-15 DIAGNOSIS — M419 Scoliosis, unspecified: Secondary | ICD-10-CM | POA: Diagnosis present

## 2021-07-15 DIAGNOSIS — E039 Hypothyroidism, unspecified: Secondary | ICD-10-CM | POA: Diagnosis present

## 2021-07-15 DIAGNOSIS — I130 Hypertensive heart and chronic kidney disease with heart failure and stage 1 through stage 4 chronic kidney disease, or unspecified chronic kidney disease: Secondary | ICD-10-CM | POA: Diagnosis not present

## 2021-07-15 DIAGNOSIS — T8453XA Infection and inflammatory reaction due to internal right knee prosthesis, initial encounter: Secondary | ICD-10-CM | POA: Diagnosis not present

## 2021-07-15 DIAGNOSIS — R040 Epistaxis: Secondary | ICD-10-CM | POA: Diagnosis present

## 2021-07-15 DIAGNOSIS — R6889 Other general symptoms and signs: Secondary | ICD-10-CM | POA: Diagnosis not present

## 2021-07-15 DIAGNOSIS — E441 Mild protein-calorie malnutrition: Secondary | ICD-10-CM | POA: Diagnosis not present

## 2021-07-15 DIAGNOSIS — I471 Supraventricular tachycardia: Secondary | ICD-10-CM | POA: Diagnosis present

## 2021-07-15 DIAGNOSIS — I5032 Chronic diastolic (congestive) heart failure: Secondary | ICD-10-CM | POA: Diagnosis not present

## 2021-07-15 DIAGNOSIS — I4892 Unspecified atrial flutter: Secondary | ICD-10-CM | POA: Diagnosis not present

## 2021-07-15 DIAGNOSIS — E1151 Type 2 diabetes mellitus with diabetic peripheral angiopathy without gangrene: Secondary | ICD-10-CM | POA: Diagnosis present

## 2021-07-15 DIAGNOSIS — Z96653 Presence of artificial knee joint, bilateral: Secondary | ICD-10-CM | POA: Diagnosis present

## 2021-07-15 DIAGNOSIS — E1122 Type 2 diabetes mellitus with diabetic chronic kidney disease: Secondary | ICD-10-CM | POA: Diagnosis not present

## 2021-07-15 DIAGNOSIS — N1832 Chronic kidney disease, stage 3b: Secondary | ICD-10-CM | POA: Diagnosis present

## 2021-07-15 DIAGNOSIS — Z79890 Hormone replacement therapy (postmenopausal): Secondary | ICD-10-CM

## 2021-07-15 DIAGNOSIS — R509 Fever, unspecified: Secondary | ICD-10-CM | POA: Diagnosis not present

## 2021-07-15 DIAGNOSIS — E669 Obesity, unspecified: Secondary | ICD-10-CM | POA: Diagnosis present

## 2021-07-15 DIAGNOSIS — Z833 Family history of diabetes mellitus: Secondary | ICD-10-CM

## 2021-07-15 DIAGNOSIS — I2510 Atherosclerotic heart disease of native coronary artery without angina pectoris: Secondary | ICD-10-CM | POA: Diagnosis present

## 2021-07-15 DIAGNOSIS — Z6839 Body mass index (BMI) 39.0-39.9, adult: Secondary | ICD-10-CM

## 2021-07-15 DIAGNOSIS — Z743 Need for continuous supervision: Secondary | ICD-10-CM | POA: Diagnosis not present

## 2021-07-15 DIAGNOSIS — Z951 Presence of aortocoronary bypass graft: Secondary | ICD-10-CM

## 2021-07-15 DIAGNOSIS — R531 Weakness: Secondary | ICD-10-CM

## 2021-07-15 DIAGNOSIS — R0902 Hypoxemia: Secondary | ICD-10-CM | POA: Diagnosis not present

## 2021-07-15 DIAGNOSIS — Y831 Surgical operation with implant of artificial internal device as the cause of abnormal reaction of the patient, or of later complication, without mention of misadventure at the time of the procedure: Secondary | ICD-10-CM | POA: Diagnosis present

## 2021-07-15 DIAGNOSIS — N179 Acute kidney failure, unspecified: Secondary | ICD-10-CM | POA: Diagnosis present

## 2021-07-15 DIAGNOSIS — I10 Essential (primary) hypertension: Secondary | ICD-10-CM

## 2021-07-15 DIAGNOSIS — G8929 Other chronic pain: Secondary | ICD-10-CM | POA: Diagnosis present

## 2021-07-15 DIAGNOSIS — Z96642 Presence of left artificial hip joint: Secondary | ICD-10-CM | POA: Diagnosis present

## 2021-07-15 DIAGNOSIS — R6521 Severe sepsis with septic shock: Secondary | ICD-10-CM | POA: Diagnosis not present

## 2021-07-15 DIAGNOSIS — M247 Protrusio acetabuli: Secondary | ICD-10-CM | POA: Diagnosis not present

## 2021-07-15 DIAGNOSIS — A419 Sepsis, unspecified organism: Secondary | ICD-10-CM | POA: Diagnosis not present

## 2021-07-15 DIAGNOSIS — I700 Atherosclerosis of aorta: Secondary | ICD-10-CM | POA: Diagnosis not present

## 2021-07-15 DIAGNOSIS — I480 Paroxysmal atrial fibrillation: Secondary | ICD-10-CM | POA: Diagnosis present

## 2021-07-15 DIAGNOSIS — Z20822 Contact with and (suspected) exposure to covid-19: Secondary | ICD-10-CM | POA: Diagnosis not present

## 2021-07-15 DIAGNOSIS — I499 Cardiac arrhythmia, unspecified: Secondary | ICD-10-CM | POA: Diagnosis not present

## 2021-07-15 DIAGNOSIS — Z8249 Family history of ischemic heart disease and other diseases of the circulatory system: Secondary | ICD-10-CM

## 2021-07-15 DIAGNOSIS — Z83438 Family history of other disorder of lipoprotein metabolism and other lipidemia: Secondary | ICD-10-CM

## 2021-07-15 DIAGNOSIS — I878 Other specified disorders of veins: Secondary | ICD-10-CM | POA: Diagnosis present

## 2021-07-15 DIAGNOSIS — R109 Unspecified abdominal pain: Secondary | ICD-10-CM | POA: Diagnosis not present

## 2021-07-15 DIAGNOSIS — R410 Disorientation, unspecified: Secondary | ICD-10-CM | POA: Diagnosis not present

## 2021-07-15 DIAGNOSIS — M160 Bilateral primary osteoarthritis of hip: Secondary | ICD-10-CM | POA: Diagnosis present

## 2021-07-15 DIAGNOSIS — I70233 Atherosclerosis of native arteries of right leg with ulceration of ankle: Secondary | ICD-10-CM | POA: Diagnosis not present

## 2021-07-15 LAB — BASIC METABOLIC PANEL
Anion gap: 10 (ref 5–15)
BUN: 32 mg/dL — ABNORMAL HIGH (ref 8–23)
CO2: 22 mmol/L (ref 22–32)
Calcium: 9 mg/dL (ref 8.9–10.3)
Chloride: 105 mmol/L (ref 98–111)
Creatinine, Ser: 3.11 mg/dL — ABNORMAL HIGH (ref 0.44–1.00)
GFR, Estimated: 15 mL/min — ABNORMAL LOW (ref >60)
Glucose, Bld: 106 mg/dL — ABNORMAL HIGH (ref 70–99)
Potassium: 4.5 mmol/L (ref 3.5–5.1)
Sodium: 137 mmol/L (ref 135–145)

## 2021-07-15 LAB — LACTIC ACID, PLASMA
Lactic Acid, Venous: 2.3 mmol/L (ref 0.5–1.9)
Lactic Acid, Venous: 2.2 mmol/L (ref 0.5–1.9)

## 2021-07-15 LAB — CBC
HCT: 41.1 % (ref 36.0–46.0)
Hemoglobin: 12.9 g/dL (ref 12.0–15.0)
MCH: 30.6 pg (ref 26.0–34.0)
MCHC: 31.4 g/dL (ref 30.0–36.0)
MCV: 97.4 fL (ref 80.0–100.0)
Platelets: 157 10*3/uL (ref 150–400)
RBC: 4.22 MIL/uL (ref 3.87–5.11)
RDW: 16.2 % — ABNORMAL HIGH (ref 11.5–15.5)
WBC: 13.4 10*3/uL — ABNORMAL HIGH (ref 4.0–10.5)
nRBC: 0 % (ref 0.0–0.2)

## 2021-07-15 LAB — RESP PANEL BY RT-PCR (FLU A&B, COVID) ARPGX2
Influenza A by PCR: NEGATIVE
Influenza B by PCR: NEGATIVE
SARS Coronavirus 2 by RT PCR: NEGATIVE

## 2021-07-15 LAB — HEPATIC FUNCTION PANEL
ALT: 41 U/L (ref 0–44)
AST: 46 U/L — ABNORMAL HIGH (ref 15–41)
Albumin: 3.3 g/dL — ABNORMAL LOW (ref 3.5–5.0)
Alkaline Phosphatase: 118 U/L (ref 38–126)
Bilirubin, Direct: 0.8 mg/dL — ABNORMAL HIGH (ref 0.0–0.2)
Indirect Bilirubin: 1.2 mg/dL — ABNORMAL HIGH (ref 0.3–0.9)
Total Bilirubin: 2 mg/dL — ABNORMAL HIGH (ref 0.3–1.2)
Total Protein: 7.1 g/dL (ref 6.5–8.1)

## 2021-07-15 LAB — BRAIN NATRIURETIC PEPTIDE: B Natriuretic Peptide: 78.7 pg/mL (ref 0.0–100.0)

## 2021-07-15 LAB — TROPONIN I (HIGH SENSITIVITY): Troponin I (High Sensitivity): 15 ng/L (ref <18)

## 2021-07-15 LAB — APTT: aPTT: 29 seconds (ref 24–36)

## 2021-07-15 LAB — PROTIME-INR
INR: 1.2 (ref 0.8–1.2)
Prothrombin Time: 15.5 seconds — ABNORMAL HIGH (ref 11.4–15.2)

## 2021-07-15 MED ORDER — SODIUM CHLORIDE 0.9 % IV BOLUS
1000.0000 mL | Freq: Once | INTRAVENOUS | Status: AC
  Administered 2021-07-15: 1000 mL via INTRAVENOUS

## 2021-07-15 MED ORDER — OXYCODONE-ACETAMINOPHEN 5-325 MG PO TABS
1.0000 | ORAL_TABLET | Freq: Once | ORAL | Status: AC
  Administered 2021-07-15: 1 via ORAL
  Filled 2021-07-15: qty 1

## 2021-07-15 MED ORDER — OXYCODONE HCL 5 MG PO TABS
5.0000 mg | ORAL_TABLET | Freq: Once | ORAL | Status: DC
  Filled 2021-07-15: qty 1

## 2021-07-15 NOTE — ED Notes (Signed)
Repositioned.  Patient able to stand with assist and reposition in recliner chair.  Tolerated well.

## 2021-07-15 NOTE — ED Provider Notes (Signed)
Emergency Medicine Provider Triage Evaluation Note  DEAUN ROCHA , a 75 y.o. female  was evaluated in triage.  Pt complains of weakness, decreased appetite.  According to the husband patient also appeared confused last night.  Patient states that she thinks that she may have COVID or flu.  Denies any headache, chest pain, shortness of breath, abdominal pain at this time..  Review of Systems  Positive: Weakness, possible mild confusion Negative: Chest pain, shortness of breath, fevers, chills, emesis, diarrhea, urinary changes  Physical Exam  BP (!) 71/41 (BP Location: Left Arm)   Pulse (!) 133   Temp 98.9 F (37.2 C) (Oral)   Resp 14   Ht 5\' 11"  (1.803 m)   Wt (!) 149.7 kg   SpO2 92%   BMI 46.03 kg/m  Gen:   Awake, no distress   Resp:  Normal effort  MSK:   Moves extremities without difficulty  Other:  Cranial nerves intact at this time  Medical Decision Making  Medically screening exam initiated at 2:11 PM.  Appropriate orders placed.  ALYSSE RATHE was informed that the remainder of the evaluation will be completed by another provider, this initial triage assessment does not replace that evaluation, and the importance of remaining in the ED until their evaluation is complete.  Patient has been triage prior to my assessment, patient became hypotensive, slightly tachycardic.  According to the husband she has not been eating and drinking much over the past several days.  Patient denied any other complaints at this time.  States that she feels weak but overall feels okay.  At this time we will initiate fluids.  labs, chest x-ray in process at this time.   Darletta Moll, PA-C 07/15/21 1411    Duffy Bruce, MD 07/16/21 [phone]

## 2021-07-15 NOTE — ED Triage Notes (Signed)
See first nurse note, pt reports she is only here to "get checked out to see if have COVID or flu". States she has been feeling weak for the past few days but feels better today. Reports decreased eating, drinking, urine output. Last urine output this am.   Pt repeatedly denies pain, states "I feel fine"

## 2021-07-15 NOTE — ED Triage Notes (Signed)
First Nurse Note:  Arrives via [facility] from home.  Per report, hypotension.  Per report, 2 day history of decreased urine output.  Metoprolol taken this morning at 0600.  86/52 -- 400 cc NS -- 20g LAC.

## 2021-07-16 ENCOUNTER — Other Ambulatory Visit: Payer: Self-pay

## 2021-07-16 ENCOUNTER — Emergency Department: Payer: Medicare Other

## 2021-07-16 ENCOUNTER — Inpatient Hospital Stay
Admission: EM | Admit: 2021-07-16 | Discharge: 2021-07-20 | DRG: 559 | Disposition: A | Discharge status: Home / Self Care | Payer: Medicare Other | Attending: Internal Medicine | Admitting: Internal Medicine

## 2021-07-16 DIAGNOSIS — R000 Tachycardia, unspecified: Secondary | ICD-10-CM

## 2021-07-16 DIAGNOSIS — Z7189 Other specified counseling: Secondary | ICD-10-CM | POA: Diagnosis not present

## 2021-07-16 DIAGNOSIS — I890 Lymphedema, not elsewhere classified: Secondary | ICD-10-CM | POA: Diagnosis present

## 2021-07-16 DIAGNOSIS — I70233 Atherosclerosis of native arteries of right leg with ulceration of ankle: Secondary | ICD-10-CM

## 2021-07-16 DIAGNOSIS — Y831 Surgical operation with implant of artificial internal device as the cause of abnormal reaction of the patient, or of later complication, without mention of misadventure at the time of the procedure: Secondary | ICD-10-CM | POA: Diagnosis present

## 2021-07-16 DIAGNOSIS — I130 Hypertensive heart and chronic kidney disease with heart failure and stage 1 through stage 4 chronic kidney disease, or unspecified chronic kidney disease: Secondary | ICD-10-CM | POA: Diagnosis present

## 2021-07-16 DIAGNOSIS — E441 Mild protein-calorie malnutrition: Secondary | ICD-10-CM

## 2021-07-16 DIAGNOSIS — M1611 Unilateral primary osteoarthritis, right hip: Secondary | ICD-10-CM | POA: Diagnosis not present

## 2021-07-16 DIAGNOSIS — N179 Acute kidney failure, unspecified: Secondary | ICD-10-CM

## 2021-07-16 DIAGNOSIS — M25451 Effusion, right hip: Secondary | ICD-10-CM

## 2021-07-16 DIAGNOSIS — E669 Obesity, unspecified: Secondary | ICD-10-CM | POA: Diagnosis present

## 2021-07-16 DIAGNOSIS — R6521 Severe sepsis with septic shock: Secondary | ICD-10-CM | POA: Diagnosis not present

## 2021-07-16 DIAGNOSIS — I471 Supraventricular tachycardia: Secondary | ICD-10-CM | POA: Diagnosis not present

## 2021-07-16 DIAGNOSIS — D72829 Elevated white blood cell count, unspecified: Secondary | ICD-10-CM

## 2021-07-16 DIAGNOSIS — T8453XA Infection and inflammatory reaction due to internal right knee prosthesis, initial encounter: Secondary | ICD-10-CM | POA: Diagnosis present

## 2021-07-16 DIAGNOSIS — E1169 Type 2 diabetes mellitus with other specified complication: Secondary | ICD-10-CM | POA: Diagnosis present

## 2021-07-16 DIAGNOSIS — Z20822 Contact with and (suspected) exposure to covid-19: Secondary | ICD-10-CM | POA: Diagnosis present

## 2021-07-16 DIAGNOSIS — Z515 Encounter for palliative care: Secondary | ICD-10-CM | POA: Diagnosis not present

## 2021-07-16 DIAGNOSIS — G4733 Obstructive sleep apnea (adult) (pediatric): Secondary | ICD-10-CM | POA: Diagnosis present

## 2021-07-16 DIAGNOSIS — E1151 Type 2 diabetes mellitus with diabetic peripheral angiopathy without gangrene: Secondary | ICD-10-CM | POA: Diagnosis present

## 2021-07-16 DIAGNOSIS — A419 Sepsis, unspecified organism: Secondary | ICD-10-CM

## 2021-07-16 DIAGNOSIS — I5032 Chronic diastolic (congestive) heart failure: Secondary | ICD-10-CM | POA: Diagnosis present

## 2021-07-16 DIAGNOSIS — Z743 Need for continuous supervision: Secondary | ICD-10-CM | POA: Diagnosis not present

## 2021-07-16 DIAGNOSIS — Z95828 Presence of other vascular implants and grafts: Secondary | ICD-10-CM | POA: Diagnosis not present

## 2021-07-16 DIAGNOSIS — R109 Unspecified abdominal pain: Secondary | ICD-10-CM | POA: Diagnosis not present

## 2021-07-16 DIAGNOSIS — R531 Weakness: Secondary | ICD-10-CM | POA: Diagnosis not present

## 2021-07-16 DIAGNOSIS — Z888 Allergy status to other drugs, medicaments and biological substances status: Secondary | ICD-10-CM | POA: Diagnosis not present

## 2021-07-16 DIAGNOSIS — M247 Protrusio acetabuli: Secondary | ICD-10-CM | POA: Diagnosis not present

## 2021-07-16 DIAGNOSIS — R040 Epistaxis: Secondary | ICD-10-CM | POA: Diagnosis not present

## 2021-07-16 DIAGNOSIS — I700 Atherosclerosis of aorta: Secondary | ICD-10-CM | POA: Diagnosis present

## 2021-07-16 DIAGNOSIS — R5381 Other malaise: Secondary | ICD-10-CM | POA: Diagnosis not present

## 2021-07-16 DIAGNOSIS — I959 Hypotension, unspecified: Secondary | ICD-10-CM | POA: Diagnosis not present

## 2021-07-16 DIAGNOSIS — I4892 Unspecified atrial flutter: Secondary | ICD-10-CM | POA: Diagnosis not present

## 2021-07-16 DIAGNOSIS — N1832 Chronic kidney disease, stage 3b: Secondary | ICD-10-CM | POA: Diagnosis present

## 2021-07-16 DIAGNOSIS — E785 Hyperlipidemia, unspecified: Secondary | ICD-10-CM | POA: Diagnosis present

## 2021-07-16 DIAGNOSIS — R6510 Systemic inflammatory response syndrome (SIRS) of non-infectious origin without acute organ dysfunction: Secondary | ICD-10-CM | POA: Diagnosis not present

## 2021-07-16 DIAGNOSIS — Z7401 Bed confinement status: Secondary | ICD-10-CM | POA: Diagnosis not present

## 2021-07-16 DIAGNOSIS — R579 Shock, unspecified: Secondary | ICD-10-CM | POA: Diagnosis not present

## 2021-07-16 DIAGNOSIS — E039 Hypothyroidism, unspecified: Secondary | ICD-10-CM | POA: Diagnosis present

## 2021-07-16 DIAGNOSIS — R509 Fever, unspecified: Secondary | ICD-10-CM | POA: Diagnosis not present

## 2021-07-16 DIAGNOSIS — Z86718 Personal history of other venous thrombosis and embolism: Secondary | ICD-10-CM | POA: Diagnosis not present

## 2021-07-16 DIAGNOSIS — Z6839 Body mass index (BMI) 39.0-39.9, adult: Secondary | ICD-10-CM | POA: Diagnosis not present

## 2021-07-16 DIAGNOSIS — E1122 Type 2 diabetes mellitus with diabetic chronic kidney disease: Secondary | ICD-10-CM | POA: Diagnosis present

## 2021-07-16 LAB — LACTIC ACID, PLASMA
Lactic Acid, Venous: 3.3 mmol/L (ref 0.5–1.9)
Lactic Acid, Venous: 2.1 mmol/L (ref 0.5–1.9)

## 2021-07-16 LAB — URINALYSIS, COMPLETE (UACMP) WITH MICROSCOPIC
Bacteria, UA: NONE SEEN
Bilirubin Urine: NEGATIVE
Glucose, UA: NEGATIVE mg/dL
Hgb urine dipstick: NEGATIVE
Ketones, ur: 5 mg/dL — AB
Leukocytes,Ua: NEGATIVE
Nitrite: NEGATIVE
Protein, ur: 30 mg/dL — AB
Specific Gravity, Urine: 1.031 — ABNORMAL HIGH (ref 1.005–1.030)
pH: 5 (ref 5.0–8.0)

## 2021-07-16 LAB — CBC
HCT: 40.4 % (ref 36.0–46.0)
Hemoglobin: 13.4 g/dL (ref 12.0–15.0)
MCH: 32.1 pg (ref 26.0–34.0)
MCHC: 33.2 g/dL (ref 30.0–36.0)
MCV: 96.9 fL (ref 80.0–100.0)
Platelets: 150 10*3/uL (ref 150–400)
RBC: 4.17 MIL/uL (ref 3.87–5.11)
RDW: 16.4 % — ABNORMAL HIGH (ref 11.5–15.5)
WBC: 13.5 10*3/uL — ABNORMAL HIGH (ref 4.0–10.5)
nRBC: 0 % (ref 0.0–0.2)

## 2021-07-16 LAB — BLOOD GAS, ARTERIAL
Acid-base deficit: 6.1 mmol/L — ABNORMAL HIGH (ref 0.0–2.0)
Bicarbonate: 18.9 mmol/L — ABNORMAL LOW (ref 20.0–28.0)
O2 Saturation: 92.7 %
Patient temperature: 37
pCO2 arterial: 35 mmHg (ref 32.0–48.0)
pH, Arterial: 7.34 — ABNORMAL LOW (ref 7.350–7.450)
pO2, Arterial: 70 mmHg — ABNORMAL LOW (ref 83.0–108.0)

## 2021-07-16 LAB — RESP PANEL BY RT-PCR (FLU A&B, COVID) ARPGX2
Influenza A by PCR: NEGATIVE
Influenza B by PCR: NEGATIVE
SARS Coronavirus 2 by RT PCR: NEGATIVE

## 2021-07-16 LAB — URINE CULTURE: Culture: NO GROWTH

## 2021-07-16 LAB — TROPONIN I (HIGH SENSITIVITY): Troponin I (High Sensitivity): 21 ng/L — ABNORMAL HIGH (ref <18)

## 2021-07-16 LAB — CBG MONITORING, ED: Glucose-Capillary: 98 mg/dL (ref 70–99)

## 2021-07-16 LAB — GLUCOSE, CAPILLARY
Glucose-Capillary: 90 mg/dL (ref 70–99)
Glucose-Capillary: 91 mg/dL (ref 70–99)
Glucose-Capillary: 91 mg/dL (ref 70–99)
Glucose-Capillary: 100 mg/dL — ABNORMAL HIGH (ref 70–99)

## 2021-07-16 LAB — PROCALCITONIN: Procalcitonin: 1.28 ng/mL

## 2021-07-16 LAB — PHOSPHORUS: Phosphorus: 3.3 mg/dL (ref 2.5–4.6)

## 2021-07-16 LAB — MAGNESIUM: Magnesium: 2.3 mg/dL (ref 1.7–2.4)

## 2021-07-16 LAB — MRSA NEXT GEN BY PCR, NASAL: MRSA by PCR Next Gen: DETECTED — AB

## 2021-07-16 LAB — CREATININE, SERUM
Creatinine, Ser: 3.47 mg/dL — ABNORMAL HIGH (ref 0.44–1.00)
GFR, Estimated: 13 mL/min — ABNORMAL LOW (ref >60)

## 2021-07-16 MED ORDER — LACTATED RINGERS IV BOLUS
1000.0000 mL | Freq: Once | INTRAVENOUS | Status: AC
  Administered 2021-07-16: 1000 mL via INTRAVENOUS

## 2021-07-16 MED ORDER — VANCOMYCIN HCL 2000 MG/400ML IV SOLN
2000.0000 mg | Freq: Once | INTRAVENOUS | Status: AC
  Administered 2021-07-16: 2000 mg via INTRAVENOUS
  Filled 2021-07-16: qty 400

## 2021-07-16 MED ORDER — NOREPINEPHRINE 4 MG/250ML-% IV SOLN
INTRAVENOUS | Status: AC
  Administered 2021-07-16: 8 ug/min via INTRAVENOUS
  Filled 2021-07-16: qty 250

## 2021-07-16 MED ORDER — ADENOSINE 12 MG/4ML IV SOLN
INTRAVENOUS | Status: AC
  Administered 2021-07-16: 6 mg
  Filled 2021-07-16: qty 4

## 2021-07-16 MED ORDER — ADENOSINE 12 MG/4ML IV SOLN: 6.0000 mg | Freq: Once | INTRAVENOUS | Status: AC

## 2021-07-16 MED ORDER — MUPIROCIN 2 % EX OINT
TOPICAL_OINTMENT | Freq: Two times a day (BID) | CUTANEOUS | Status: DC
  Administered 2021-07-17 – 2021-07-18 (×2): 1 via NASAL
  Filled 2021-07-16: qty 22

## 2021-07-16 MED ORDER — LEVOTHYROXINE SODIUM 25 MCG PO TABS
25.0000 ug | ORAL_TABLET | Freq: Every day | ORAL | Status: DC
  Administered 2021-07-17 – 2021-07-20 (×4): 25 ug via ORAL
  Filled 2021-07-16 (×4): qty 1

## 2021-07-16 MED ORDER — HEPARIN SODIUM (PORCINE) 5000 UNIT/ML IJ SOLN
5000.0000 [IU] | Freq: Three times a day (TID) | INTRAMUSCULAR | Status: DC
  Administered 2021-07-16 – 2021-07-20 (×12): 5000 [IU] via SUBCUTANEOUS
  Filled 2021-07-16 (×12): qty 1

## 2021-07-16 MED ORDER — PHENYLEPHRINE HCL-NACL 20-0.9 MG/250ML-% IV SOLN
25.0000 ug/min | INTRAVENOUS | Status: DC
  Administered 2021-07-16: 90 ug/min via INTRAVENOUS
  Administered 2021-07-16: 25 ug/min via INTRAVENOUS
  Administered 2021-07-17: 60 ug/min via INTRAVENOUS
  Filled 2021-07-16 (×3): qty 250

## 2021-07-16 MED ORDER — HYDROCORTISONE SOD SUC (PF) 100 MG IJ SOLR
100.0000 mg | Freq: Three times a day (TID) | INTRAMUSCULAR | Status: DC
  Administered 2021-07-16 – 2021-07-18 (×6): 100 mg via INTRAVENOUS
  Filled 2021-07-16 (×6): qty 2

## 2021-07-16 MED ORDER — FLUTICASONE PROPIONATE 50 MCG/ACT NA SUSP
1.0000 | Freq: Every day | NASAL | Status: DC
  Administered 2021-07-16 – 2021-07-18 (×3): 1 via NASAL
  Filled 2021-07-16: qty 16

## 2021-07-16 MED ORDER — ASPIRIN EC 81 MG PO TBEC
81.0000 mg | DELAYED_RELEASE_TABLET | Freq: Every day | ORAL | Status: DC
  Administered 2021-07-17 – 2021-07-20 (×4): 81 mg via ORAL
  Filled 2021-07-16 (×4): qty 1

## 2021-07-16 MED ORDER — INSULIN ASPART 100 UNIT/ML IJ SOLN
0.0000 [IU] | INTRAMUSCULAR | Status: DC
  Administered 2021-07-17: 2 [IU] via SUBCUTANEOUS
  Administered 2021-07-18: 1 [IU] via SUBCUTANEOUS
  Filled 2021-07-16 (×2): qty 1

## 2021-07-16 MED ORDER — SODIUM CHLORIDE 0.9 % IV BOLUS (SEPSIS)
1500.0000 mL | Freq: Once | INTRAVENOUS | Status: AC
  Administered 2021-07-16: 1500 mL via INTRAVENOUS

## 2021-07-16 MED ORDER — VANCOMYCIN HCL IN DEXTROSE 1-5 GM/200ML-% IV SOLN
1000.0000 mg | Freq: Once | INTRAVENOUS | Status: DC
  Filled 2021-07-16: qty 200

## 2021-07-16 MED ORDER — MIDODRINE HCL 5 MG PO TABS
10.0000 mg | ORAL_TABLET | Freq: Three times a day (TID) | ORAL | Status: DC
  Administered 2021-07-16 – 2021-07-17 (×5): 10 mg via ORAL
  Filled 2021-07-16 (×5): qty 2

## 2021-07-16 MED ORDER — METRONIDAZOLE 500 MG/100ML IV SOLN
500.0000 mg | Freq: Once | INTRAVENOUS | Status: AC
  Administered 2021-07-16: 500 mg via INTRAVENOUS
  Filled 2021-07-16: qty 100

## 2021-07-16 MED ORDER — POLYETHYLENE GLYCOL 3350 17 G PO PACK: 17.0000 g | PACK | Freq: Every day | ORAL | Status: DC | PRN

## 2021-07-16 MED ORDER — DOCUSATE SODIUM 100 MG PO CAPS: 100.0000 mg | ORAL_CAPSULE | Freq: Two times a day (BID) | ORAL | Status: DC | PRN

## 2021-07-16 MED ORDER — SODIUM CHLORIDE 0.9 % IV SOLN
2.0000 g | Freq: Once | INTRAVENOUS | Status: AC
  Administered 2021-07-16: 2 g via INTRAVENOUS
  Filled 2021-07-16: qty 2

## 2021-07-16 MED ORDER — CHLORHEXIDINE GLUCONATE CLOTH 2 % EX PADS
6.0000 | MEDICATED_PAD | Freq: Every day | CUTANEOUS | Status: DC
  Administered 2021-07-16 – 2021-07-18 (×3): 6 via TOPICAL

## 2021-07-16 MED ORDER — SALINE SPRAY 0.65 % NA SOLN
1.0000 | Freq: Three times a day (TID) | NASAL | Status: DC
  Administered 2021-07-17 – 2021-07-20 (×9): 1 via NASAL
  Filled 2021-07-16: qty 44

## 2021-07-16 MED ORDER — VANCOMYCIN VARIABLE DOSE PER UNSTABLE RENAL FUNCTION (PHARMACIST DOSING): Status: DC

## 2021-07-16 MED ORDER — SODIUM CHLORIDE 0.9 % IV SOLN
2.0000 g | INTRAVENOUS | Status: DC
  Administered 2021-07-16: 2 g via INTRAVENOUS
  Filled 2021-07-16 (×2): qty 2

## 2021-07-16 MED ORDER — ATORVASTATIN CALCIUM 20 MG PO TABS
40.0000 mg | ORAL_TABLET | Freq: Every evening | ORAL | Status: DC
  Administered 2021-07-16 – 2021-07-19 (×4): 40 mg via ORAL
  Filled 2021-07-16 (×5): qty 2

## 2021-07-16 MED ORDER — NOREPINEPHRINE 4 MG/250ML-% IV SOLN
0.0000 ug/min | INTRAVENOUS | Status: DC
  Administered 2021-07-16: 8 ug/min via INTRAVENOUS
  Administered 2021-07-16: 10 ug/min via INTRAVENOUS
  Filled 2021-07-16 (×2): qty 250

## 2021-07-16 NOTE — Sepsis Progress Note (Signed)
Notified bedside nurse of need to draw repeat lactic acid. 

## 2021-07-16 NOTE — Consult Note (Signed)
PHARMACY CONSULT NOTE - FOLLOW UP  Pharmacy Consult for Electrolyte Monitoring and Replacement   Recent Labs: Potassium (mmol/L)  Date Value  07/15/2021 4.5  10/18/2012 3.5   Magnesium (mg/dL)  Date Value  07/16/2021 2.3   Calcium (mg/dL)  Date Value  07/15/2021 9.0   Calcium, Total (mg/dL)  Date Value  10/18/2012 7.9 (L)   Albumin (g/dL)  Date Value  07/15/2021 3.3 (L)  11/26/2015 4.3  05/22/2012 3.4   Phosphorus (mg/dL)  Date Value  07/16/2021 3.3   Sodium (mmol/L)  Date Value  07/15/2021 137  11/26/2015 142  10/18/2012 141     Assessment: 75yo female w/ h/o SVT, TCP, DVT, MRSA bacteremia, T2DM, CKD, HLD,  & HTN presenting w/ AKI on CKD, hypotension, and dec'd UOP. Broad spectrum abx were started with c/f sepsis but despite fluid resuscitation pt remain MAP <65 and started levophed with CCU transfer. Pharmacy consulted for the mgmt of electrolytes.  Scr: (BL 1.29; now 3.11>3.47) MIVF: none Nutrition: none  Goal of Therapy:  Lytes WNL  Plan:  Lytes are WNL today not requiring further repletion. Will CTM and adjust PRN with AM labs.  Lorna Dibble ,PharmD,[facility] Clinical Pharmacist 07/16/2021 8:15 AM

## 2021-07-16 NOTE — Progress Notes (Addendum)
Pharmacy Antibiotic Note  Becky Trevino is a 75 y.o. female admitted on 07/16/2021 with sepsis.  Pharmacy has been consulted for Cefepime and Vancomycin dosing.  Plan: Cefepime 2 gm q24h per indication and renal fxn.  Vancomycin Current calculated Vanc interval is q48h and Scr worse (BL 1.29; now 3.11>3.47). Ordered for MRSA PCR. Variable Vancomycin dosing until SCr stabilizes (see below).  Pt recieved Vancomycin 2 gm loading dose (11/01 0430) Will recheck Scr with AM labs and: If worse will get Vanc random level to assess dosing x1 at 48hrs If better consider q36-q48h regimen    Pharmacy will continue to follow and adjust/order Abx when warranted.  Height: 5\' 11"  (180.3 cm) Weight: (!) 149.7 kg (330 lb) IBW/kg (Calculated) : 70.8  Temp (24hrs), Avg:98.9 F (37.2 C), Min:98.9 F (37.2 C), Max:98.9 F (37.2 C)  Recent Labs  Lab 07/15/21 1051 07/15/21 1808 07/15/21 2019 07/16/21 0113  WBC 13.4*  --   --   --   CREATININE 3.11*  --   --   --   LATICACIDVEN  --  2.3* 2.2* 3.3*    Estimated Creatinine Clearance: 25.3 mL/min (A) (by C-G formula based on SCr of 3.11 mg/dL (H)).    Allergies  Allergen Reactions   Lisinopril Swelling    Antimicrobials this admission: 11/01 Cefepime >>  11/01 Vancomycin >>  11/01 Flagyl x 1 dose  Microbiology results: 10/31 BCx: Pending 11/01 UCx: Pending  11/01 MRSA PCR: ordered  Thank you for allowing pharmacy to be a part of this patient's care.  Lorna Dibble, PharmD, [facility] Clinical Pharmacist 07/16/2021 2:30 AM

## 2021-07-16 NOTE — Sepsis Progress Note (Signed)
Following for sepsis monitoring ?

## 2021-07-16 NOTE — Consult Note (Signed)
[facility] Consult Note  MRN : [id number]  ASRA Becky Trevino is a 75 y.o. ([birth year]) female who presents with chief complaint of  Chief Complaint  Patient presents with   Weakness  .  History of Present Illness: I am asked to see the patient by Dr. Mortimer Fries.  The patient was admitted with sepsis and severe tachycardia.  She has had improvement in her heart rate with adenosine but remains hypotensive.  She can provide no history as she is somnolent.  Family provides much of the history.  They say she has been complaining of right hip pain and that is about it.  I have seen her chronically for leg swelling with lymphedema and postphlebitic changes.  This appears stable.  She has also been having severe nosebleeds and had nasal packing in place until removed this evening by ENT.  Current Facility-Administered Medications  Medication Dose Route Frequency Provider Last Rate Last Admin   aspirin EC tablet 81 mg  81 mg Oral Daily Ouma, Bing Neighbors, NP       atorvastatin (LIPITOR) tablet 40 mg  40 mg Oral QPM Lang Snow, NP       [START ON 07/17/2021] ceFEPIme (MAXIPIME) 2 g in sodium chloride 0.9 % 100 mL IVPB  2 g Intravenous Q24H Belue, Nathan S, RPH       Chlorhexidine Gluconate Cloth 2 % PADS 6 each  6 each Topical Daily [name], MD   6 each at 07/16/21 1645   docusate sodium (COLACE) capsule 100 mg  100 mg Oral BID PRN Lang Snow, NP       fluticasone (FLONASE) 50 MCG/ACT nasal spray 1 spray  1 spray Each Nare Daily Lang Snow, NP   1 spray at 07/16/21 1013   heparin injection 5,000 Units  5,000 Units Subcutaneous Q8H Lang Snow, NP   5,000 Units at 07/16/21 1346   hydrocortisone sodium succinate (SOLU-CORTEF) 100 MG injection 100 mg  100 mg Intravenous Q8H Beers, Shanon Brow, RPH   100 mg at 07/16/21 1134   insulin aspart (novoLOG) injection 0-9 Units  0-9 Units Subcutaneous Q4H Lang Snow,  NP       levothyroxine (SYNTHROID) tablet 25 mcg  25 mcg Oral Q0600 Lang Snow, NP       midodrine (PROAMATINE) tablet 10 mg  10 mg Oral TID WC [name], MD   10 mg at 07/16/21 1142   norepinephrine (LEVOPHED) 4mg  in 225mL premix infusion  0-40 mcg/min Intravenous Continuous Rada Hay, MD 18.75 mL/hr at 07/16/21 1534 5 mcg/min at 07/16/21 1534   phenylephrine (NEO-SYNEPHRINE) 20mg /NS 291mL premix infusion  25-200 mcg/min Intravenous Titrated Milus Banister, NP 18.75 mL/hr at 07/16/21 1608 25 mcg/min at 07/16/21 1608   polyethylene glycol (MIRALAX / GLYCOLAX) packet 17 g  17 g Oral Daily PRN Lang Snow, NP       vancomycin variable dose per unstable renal function (pharmacist dosing)   Does not apply See admin instructions Renda Rolls, [facility]        Past Medical History:  Diagnosis Date   Cervical spondylosis    Chronic kidney disease    Stage 3   DDD (degenerative disc disease), cervical    [facility]   Diabetes mellitus without complication (HCC)    Diverticulosis    Hyperlipidemia    Hypertension    Intertrigo    Leg cramps    Leg varices  Obesity    OSA (obstructive sleep apnea)    Symptomatic menopausal or female climacteric states    Syncope     Past Surgical History:  Procedure Laterality Date   ABDOMINAL HYSTERECTOMY  1993   Total   BUNIONECTOMY Bilateral 1993   I & D KNEE WITH POLY EXCHANGE Right 11/19/2017   Procedure: RIGHT KNEE POLY EXCHANGE WITH IRRIGATION AND DEBRIDEMENT;  Surgeon: Hessie Knows, MD;  Location: [facility];  Service: Orthopedics;  Laterality: Right;   JOINT REPLACEMENT     bilateral knee   LAMINECTOMY  11/14/2013    Cervical Fusion , [facility], Dr. Delilah Shan   LASER ABLATION Bilateral 07/29/2012   Dr. Lucky Cowboy   LOWER EXTREMITY ANGIOGRAPHY Right 11/02/2019   Procedure: Lower Extremity Angiography;  Surgeon: Katha Cabal, MD;  Location: [place] [facility];  Service: Cardiovascular;  Laterality: Right;   ORIF  ANKLE FRACTURE Right 09/02/2019   Procedure: OPEN REDUCTION INTERNAL FIXATION (ORIF) ANKLE FRACTURE BIMALLEOLAR;  Surgeon: [name], DPM;  Location: [facility];  Service: Podiatry;  Laterality: Right;   PERIPHERAL VASCULAR THROMBECTOMY Right 03/02/2020   Procedure: PERIPHERAL VASCULAR THROMBECTOMY;  Surgeon: Algernon Huxley, MD;  Location: [place] [facility];  Service: Cardiovascular;  Laterality: Right;   REPLACEMENT TOTAL KNEE Left 03/2010   Dr. Rudene Christians   TOTAL HIP ARTHROPLASTY Left 12/11/2015   Procedure: TOTAL HIP ARTHROPLASTY ANTERIOR APPROACH;  Surgeon: Hessie Knows, MD;  Location: [facility];  Service: Orthopedics;  Laterality: Left;   TOTAL KNEE ARTHROPLASTY Right 03/11/2016   Procedure: TOTAL KNEE ARTHROPLASTY;  Surgeon: Hessie Knows, MD;  Location: [facility];  Service: Orthopedics;  Laterality: Right;     Social History   Tobacco Use   Smoking status: Never   Smokeless tobacco: Never  Vaping Use   Vaping Use: Never used  Substance Use Topics   Alcohol use: No    Alcohol/week: 0.0 standard drinks   Drug use: No     Family History  Problem Relation Age of Onset   Diabetes Mother    Hyperlipidemia Mother    Hypertension Mother    Diabetes Father    Hyperlipidemia Father    Hypertension Father    Obesity Father    Hypertension Sister    Hyperlipidemia Sister    Hyperlipidemia Sister    Breast cancer Neg Hx     Allergies  Allergen Reactions   Lisinopril Swelling     REVIEW OF SYSTEMS (Negative unless checked) Patient unable to provide history due to her poor mental status.  Physical Examination  Vitals:   07/16/21 1545 07/16/21 1600 07/16/21 1615 07/16/21 1630  BP: (!) 82/61 90/78 (!) 81/55 (!) 91/53  Pulse: (!) 162 (!) 161 (!) 156 92  Resp: 16 18 (!) 25 14  Temp:  98.6 F (37 C)    TempSrc:  Oral    SpO2: 94% 98% 95% 95%  Weight:      Height:       Body mass index is 37.51 kg/m. Gen:  WD/WN, NAD Head: [place]/AT, No temporalis wasting.  Ear/Nose/Throat:  Hearing grossly intact, nares w/o erythema or drainage, oropharynx w/o Erythema/Exudate Eyes: Sclera non-icteric, conjunctiva clear Neck: Trachea midline.  No JVD.  Pulmonary:  Good air movement, respirations not labored, equal bilaterally.  Cardiac: slightly tachycardic, regular Vascular:  Vessel Right Left  Radial Palpable Palpable                          PT Not  Palpable Not Palpable  DP Not Palpable Not Palpable    Musculoskeletal: M/S 5/5 throughout.  Extremities without ischemic changes.  No deformity or atrophy. 2+ RLE edema, 1+ LLE edema. Neurologic: Difficult to assess due to her mental status Psychiatric: patient is somnolent, difficult to assess. Dermatologic: No rashes or ulcers noted.  No cellulitis or open wounds.      CBC Lab Results  Component Value Date   WBC 13.5 (H) 07/16/2021   HGB 13.4 07/16/2021   HCT 40.4 07/16/2021   MCV 96.9 07/16/2021   PLT 150 07/16/2021    BMET    Component Value Date/Time   NA 137 07/15/2021 1051   NA 142 11/26/2015 1122   NA 141 10/18/2012 1716   K 4.5 07/15/2021 1051   K 3.5 10/18/2012 1716   CL 105 07/15/2021 1051   CL 110 (H) 10/18/2012 1716   CO2 22 07/15/2021 1051   CO2 24 10/18/2012 1716   GLUCOSE 106 (H) 07/15/2021 1051   GLUCOSE 128 (H) 10/18/2012 1716   BUN 32 (H) 07/15/2021 1051   BUN 27 11/26/2015 1122   BUN 31 (H) 10/18/2012 1716   CREATININE 3.47 (H) 07/16/2021 0633   CREATININE 1.12 (H) 06/07/2021 1118   CALCIUM 9.0 07/15/2021 1051   CALCIUM 7.9 (L) 10/18/2012 1716   GFRNONAA 13 (L) 07/16/2021 0633   GFRNONAA 41 (L) 02/04/2021 1123   GFRAA 48 (L) 02/04/2021 1123   Estimated Creatinine Clearance: 20.2 mL/min (A) (by C-G formula based on SCr of 3.47 mg/dL (H)).  COAG Lab Results  Component Value Date   INR 1.2 07/15/2021   INR 1.1 05/01/2020   INR 1.1 11/02/2019    Radiology CT ABDOMEN PELVIS WO CONTRAST  Result Date: 07/16/2021 CLINICAL DATA:  Abdominal pain and fever. EXAM: CT  ABDOMEN AND PELVIS WITHOUT CONTRAST TECHNIQUE: Multidetector CT imaging of the abdomen and pelvis was performed following the standard protocol without IV contrast. COMPARISON:  Pelvic CT dated 01/09/2020 and CT abdomen pelvis dated 11/01/2019. FINDINGS: Evaluation of this exam is limited in the absence of intravenous contrast. Lower chest: Bibasilar atelectasis/scarring. Coronary vascular calcification. No intra-abdominal free air or free fluid. Hepatobiliary: The liver is unremarkable. No intrahepatic biliary dilatation. Layering high attenuating content within the gallbladder may represent sludge or small stones. No pericholecystic fluid or evidence of acute cholecystitis by CT. Pancreas: Unremarkable. No pancreatic ductal dilatation or surrounding inflammatory changes. Spleen: Normal in size without focal abnormality. Adrenals/Urinary Tract: The adrenal glands are unremarkable. There is no hydronephrosis or nephrolithiasis on either side. A 1 cm right renal upper pole hypodense lesion is suboptimally characterized. The visualized ureters appear unremarkable. The urinary bladder is poorly visualized and obscured by streak artifact caused by left hip arthroplasty. Stomach/Bowel: Moderate stool throughout the colon. There is a 2 cm duodenal diverticulum without active inflammatory changes. There is no bowel obstruction or active inflammation. The appendix is normal. Vascular/Lymphatic: Moderate aortoiliac atherosclerotic disease. An infrarenal IVC filter noted. There is no adenopathy. Reproductive: The uterus is not visualized. Other: None Musculoskeletal: Degenerative changes of the spine and scoliosis. Severe right hip arthritic changes with protrusio acetabula and fragmentation of the acetabulum and medial acetabular wall. Total left hip arthroplasty. No acute osseous pathology. Right hip effusion. IMPRESSION: 1. No acute intra-abdominal or pelvic pathology. No hydronephrosis or nephrolithiasis. 2. Moderate  colonic stool burden. No bowel obstruction. Normal appendix. 3. Severe right hip arthritic changes with protrusion acetabula and fragmentation of the acetabulum and medial acetabular wall. Right hip effusion.  4. Aortic Atherosclerosis (ICD10-I70.0). Electronically Signed   By: Anner Crete M.D.   On: 07/16/2021 00:58   DG Chest 1 View  Result Date: 07/15/2021 CLINICAL DATA:  Pt reports she is only here to "get checked out to see if have COVID or flu". States she has been feeling weak for the past few days but feels better today. Reports decreased eating, drinking, urine output. Last urine output this am. EXAM: CHEST  1 VIEW COMPARISON:  Chest x-ray 02/23/2021, CT chest 02/23/2021 FINDINGS: The heart and mediastinal contours are unchanged. Low lung volumes. No focal consolidation. No pulmonary edema. Trace pleural effusions not excluded. No pneumothorax. No acute osseous abnormality. Severe degenerative changes of the shoulders bilaterally. IMPRESSION: Low lung volumes.  Trace pleural effusions not excluded. Electronically Signed   By: Iven Finn M.D.   On: 07/15/2021 17:30      Assessment/Plan 1.  Sepsis with tachycardia and hypotension.  No clear vascular source of infection at this point.  Her chronic DVT and postphlebitic changes would not be likely to be causing this at this point. 2.  Lymphedema.  Has chronic postphlebitic condition with lymphedema and leg swelling.  This is stable.  Nonacute or worse admission 3.  Nosebleeds.  Packing was recently removed by ENT.  If she has recurrent bleeding and is stable, consideration for maxillary artery embolization can be given and we will perform as needed. 4.  Tachyarrhythmias.  Has responded to adenosine now with a heart rate in the 90s.   Leotis Pain, MD  07/16/2021 5:02 PM    This note was created with Dragon medical transcription system.  Any error is purely unintentional

## 2021-07-16 NOTE — ED Provider Notes (Signed)
[facility]  ____________________________________________   Event Date/Time   First MD Initiated Contact with Patient 07/16/21 0207     (approximate)  I have reviewed the triage vital signs and the nursing notes.   HISTORY  Chief Complaint Weakness    HPI Becky Trevino is a 75 y.o. female past medical history of CKD, diabetes, hypertension, hyperlipidemia, OSA who presents with global weakness.  Patient notes that over the last several days she has had decreased appetite and felt generalized weakness.  Has had some cough that is nonproductive.  She denies any abdominal pain, nausea vomiting or diarrhea.  Denies urinary symptoms.  She does endorse chronic right hip pain but is still ambulating on it.  She denies any skin or soft tissue infections that she is aware of.  Of note patient has a nasal packing in her left nare from epistaxis that was placed on Thursday, 4 days ago.          Past Medical History:  Diagnosis Date   Cervical spondylosis    Chronic kidney disease    Stage 3   DDD (degenerative disc disease), cervical    [facility]   Diabetes mellitus without complication (HCC)    Diverticulosis    Hyperlipidemia    Hypertension    Intertrigo    Leg cramps    Leg varices    Obesity    OSA (obstructive sleep apnea)    Symptomatic menopausal or female climacteric states    Syncope     Patient Active Problem List   Diagnosis Date Noted   Sepsis ([place]) 07/16/2021   Atherosclerosis of aorta ([place]) 06/07/2021   Anasarca    Hypothyroidism    SVT (supraventricular tachycardia) (HCC)    Acute kidney injury superimposed on CKD ([place])    Hypotension    Class 2 obesity without serious comorbidity with body mass index (BMI) of 39.0 to 39.9 in adult    Thrombocytopenia (HCC)    Mild protein-calorie malnutrition ([place])    Hyperbilirubinemia    Injury of left femoral nerve 08/28/2020   History of DVT (deep vein thrombosis) 06/25/2020    Lymphedema 06/25/2020   History of pelvic hematoma 02/10/2020   Foot ulcer, right, limited to breakdown of skin ([place]) 12/02/2019   MRSA bacteremia 10/30/2019   Chronic venous hypertension due to deep vein thrombosis (DVT) 10/30/2019   Hematoma    Normocytic anemia 08/07/2019   Chronic infection of right knee ([place]) 11/25/2018   Infection and inflammatory reaction due to internal joint prosthesis, subsequent encounter 01/25/2018   Infection of total right knee replacement ([place]) 11/19/2017   Type 2 diabetes mellitus with hyperlipidemia ([place]) 11/06/2016   Primary osteoarthritis of knee 03/11/2016   Primary osteoarthritis of left hip 12/11/2015   Benign essential HTN 04/21/2015   Edema leg 04/21/2015   Chronic kidney disease (CKD), stage III (moderate) ([place]) 04/21/2015   Chronic venous insufficiency 04/21/2015   DDD (degenerative disc disease), cervical 04/21/2015   Diabetes mellitus with renal manifestation ([place]) 04/21/2015   Hyperlipidemia 04/21/2015   Bladder cystocele 04/21/2015   Urinary incontinence 04/21/2015   Leg varices 04/21/2015   Insomnia 04/21/2015   Eczema intertrigo 04/21/2015   Obesity, Class III, BMI 40-49.9 (morbid obesity) ([place]) 04/21/2015   OSA (obstructive sleep apnea) 04/21/2015   Menopausal and perimenopausal disorder 04/21/2015   Supraventricular premature beats 04/21/2015   Osteoarthritis of multiple joints 04/21/2015   H/O Spinal surgery 11/14/2013   Detrusor muscle hypertonia 11/07/2013  Past Surgical History:  Procedure Laterality Date   ABDOMINAL HYSTERECTOMY  1993   Total   BUNIONECTOMY Bilateral 1993   I & D KNEE WITH POLY EXCHANGE Right 11/19/2017   Procedure: RIGHT KNEE POLY EXCHANGE WITH IRRIGATION AND DEBRIDEMENT;  Surgeon: Hessie Knows, MD;  Location: [facility];  Service: Orthopedics;  Laterality: Right;   JOINT REPLACEMENT     bilateral knee   LAMINECTOMY  11/14/2013    Cervical Fusion , [facility], Dr. Delilah Shan   LASER ABLATION Bilateral 07/29/2012    Dr. Lucky Cowboy   LOWER EXTREMITY ANGIOGRAPHY Right 11/02/2019   Procedure: Lower Extremity Angiography;  Surgeon: Katha Cabal, MD;  Location: [facility];  Service: Cardiovascular;  Laterality: Right;   ORIF ANKLE FRACTURE Right 09/02/2019   Procedure: OPEN REDUCTION INTERNAL FIXATION (ORIF) ANKLE FRACTURE BIMALLEOLAR;  Surgeon: [name], DPM;  Location: [facility];  Service: Podiatry;  Laterality: Right;   PERIPHERAL VASCULAR THROMBECTOMY Right 03/02/2020   Procedure: PERIPHERAL VASCULAR THROMBECTOMY;  Surgeon: Algernon Huxley, MD;  Location: [facility];  Service: Cardiovascular;  Laterality: Right;   REPLACEMENT TOTAL KNEE Left 03/2010   Dr. Rudene Christians   TOTAL HIP ARTHROPLASTY Left 12/11/2015   Procedure: TOTAL HIP ARTHROPLASTY ANTERIOR APPROACH;  Surgeon: Hessie Knows, MD;  Location: [facility];  Service: Orthopedics;  Laterality: Left;   TOTAL KNEE ARTHROPLASTY Right 03/11/2016   Procedure: TOTAL KNEE ARTHROPLASTY;  Surgeon: Hessie Knows, MD;  Location: [facility];  Service: Orthopedics;  Laterality: Right;    Prior to Admission medications   Medication Sig Start Date End Date Taking? Authorizing Provider  acetaminophen (TYLENOL) 650 MG CR tablet Take 650-1,300 mg by mouth 2 (two) times daily as needed for pain.    [provider]  ASPIRIN LOW DOSE 81 MG EC tablet TAKE 1 TABLET BY MOUTH EVERY DAY 01/29/21   Ancil Boozer, Drue Stager, MD  atorvastatin (LIPITOR) 40 MG tablet Take 1 tablet (40 mg total) by mouth every evening. 06/07/21   [name], MD  Calcium Carbonate-Vitamin D 600-400 MG-UNIT tablet Take 1 tablet by mouth 2 (two) times daily. 11/14/20   [name], MD  cephALEXin (KEFLEX) 500 MG capsule Take 500 mg by mouth 3 (three) times daily. 07/11/21   [provider]  doxycycline (VIBRAMYCIN) 100 MG capsule Take 100 mg by mouth 2 (two) times daily. 12/14/20   [provider]  fluticasone (FLONASE) 50 MCG/ACT nasal spray SPRAY 2 SPRAYS INTO EACH NOSTRIL  EVERY DAY 05/14/21   Ancil Boozer, Drue Stager, MD  ketoconazole (NIZORAL) 2 % cream Apply 1 application topically 2 (two) times daily as needed (rash).  01/26/15   [provider]  latanoprost (XALATAN) 0.005 % ophthalmic solution SMARTSIG:In Eye(s) 07/12/20   [provider]  levothyroxine (SYNTHROID) 25 MCG tablet TAKE 1 TABLET (25 MCG TOTAL) BY MOUTH DAILY AT 6 (SIX) AM. 05/14/21 06/28/21  [name], MD  Magnesium Oxide 500 MG CAPS Take 500 mg by mouth daily.  11/10/07   [provider]  metoprolol succinate (TOPROL-XL) 25 MG 24 hr tablet TAKE 1 TABLET (25 MG TOTAL) BY MOUTH DAILY. 07/01/21   [name], MD  mirabegron ER (MYRBETRIQ) 50 MG TB24 tablet Take 1 tablet (50 mg total) by mouth daily. 06/28/21   Zara Council A, PA-C  oxyCODONE (OXY IR/ROXICODONE) 5 MG immediate release tablet Take 5 mg by mouth every 4 (four) hours as needed. 02/18/21   Hessie Knows, MD  polyethylene glycol (MIRALAX / GLYCOLAX) 17 g packet Take 17 g by  mouth daily. 09/15/20   Antonieta Pert, MD  potassium chloride (KLOR-CON) 10 MEQ tablet Take 10 mEq by mouth daily. 08/18/19   Callwood, Dwayne D, MD  SIMBRINZA 1-0.2 % SUSP Place 1 drop into both eyes 2 (two) times daily. 10/13/19   [provider]  torsemide (DEMADEX) 20 MG tablet Take 1 tablet (20 mg total) by mouth daily. 02/04/21 03/06/21  [name], MD  traZODone (DESYREL) 50 MG tablet Take 0.5-1 tablets (25-50 mg total) by mouth at bedtime as needed for sleep. 02/26/21   [name], MD    Allergies Lisinopril  Family History  Problem Relation Age of Onset   Diabetes Mother    Hyperlipidemia Mother    Hypertension Mother    Diabetes Father    Hyperlipidemia Father    Hypertension Father    Obesity Father    Hypertension Sister    Hyperlipidemia Sister    Hyperlipidemia Sister    Breast cancer Neg Hx     Social History Social History   Tobacco Use   Smoking status: Never   Smokeless tobacco: Never  Vaping Use    Vaping Use: Never used  Substance Use Topics   Alcohol use: No    Alcohol/week: 0.0 standard drinks   Drug use: No    Review of Systems   Review of Systems  Constitutional:  Positive for appetite change. Negative for chills and fever.  Respiratory:  Positive for cough. Negative for shortness of breath.   Cardiovascular:  Negative for chest pain, palpitations and leg swelling.  Gastrointestinal:  Negative for abdominal pain, diarrhea, nausea and vomiting.  Genitourinary:  Negative for dysuria.  Neurological:  Negative for headaches.  All other systems reviewed and are negative.  Physical Exam Updated Vital Signs BP 116/85   Pulse 84   Temp 98.9 F (37.2 C) (Oral)   Resp 16   Ht 5\' 11"  (1.803 m)   Wt (!) 149.7 kg   SpO2 94%   BMI 46.03 kg/m   Physical Exam Vitals and nursing note reviewed.  Constitutional:      General: She is not in acute distress.    Appearance: Normal appearance. She is obese.  HENT:     Head: Normocephalic and atraumatic.     Comments: Nasal packing in the left nare, green mucus    Nose: Nose normal.  Eyes:     General: No scleral icterus.    Conjunctiva/sclera: Conjunctivae normal.  Neck:     Comments: No meningismus Cardiovascular:     Rate and Rhythm: Normal rate and regular rhythm.  Pulmonary:     Effort: Pulmonary effort is normal. No respiratory distress.     Breath sounds: No stridor.  Abdominal:     General: Abdomen is flat. There is no distension.     Palpations: Abdomen is soft.     Tenderness: There is no abdominal tenderness. There is no guarding.  Musculoskeletal:        General: No deformity or signs of injury.     Cervical back: Normal range of motion.     Right lower leg: Edema present.     Left lower leg: Edema present.     Comments: Bilateral lower extremity with chronic venous stasis changes  Is able to range the right hip but with pain, mild pain with passive range of motion but able to range no obvious erythema   Skin:    General: Skin is dry.     Coloration: Skin is not jaundiced  or pale.  Neurological:     General: No focal deficit present.     Mental Status: She is alert and oriented to person, place, and time. Mental status is at baseline.  Psychiatric:        Mood and Affect: Mood normal.        Behavior: Behavior normal.     LABS (all labs ordered are listed, but only abnormal results are displayed)  Labs Reviewed  BASIC METABOLIC PANEL - Abnormal; Notable for the following components:      Result Value   Glucose, Bld 106 (*)    BUN 32 (*)    Creatinine, Ser 3.11 (*)    GFR, Estimated 15 (*)    All other components within normal limits  CBC - Abnormal; Notable for the following components:   WBC 13.4 (*)    RDW 16.2 (*)    All other components within normal limits  LACTIC ACID, PLASMA - Abnormal; Notable for the following components:   Lactic Acid, Venous 2.3 (*)    All other components within normal limits  LACTIC ACID, PLASMA - Abnormal; Notable for the following components:   Lactic Acid, Venous 2.2 (*)    All other components within normal limits  PROTIME-INR - Abnormal; Notable for the following components:   Prothrombin Time 15.5 (*)    All other components within normal limits  URINALYSIS, COMPLETE (UACMP) WITH MICROSCOPIC - Abnormal; Notable for the following components:   Color, Urine AMBER (*)    APPearance CLOUDY (*)    Specific Gravity, Urine 1.031 (*)    Ketones, ur 5 (*)    Protein, ur 30 (*)    All other components within normal limits  HEPATIC FUNCTION PANEL - Abnormal; Notable for the following components:   Albumin 3.3 (*)    AST 46 (*)    Total Bilirubin 2.0 (*)    Bilirubin, Direct 0.8 (*)    Indirect Bilirubin 1.2 (*)    All other components within normal limits  LACTIC ACID, PLASMA - Abnormal; Notable for the following components:   Lactic Acid, Venous 3.3 (*)    All other components within normal limits  RESP PANEL BY RT-PCR (FLU A&B, COVID)  ARPGX2  RESP PANEL BY RT-PCR (FLU A&B, COVID) ARPGX2  CULTURE, BLOOD (SINGLE)  URINE CULTURE  CULTURE, BLOOD (ROUTINE X 2)  CULTURE, BLOOD (ROUTINE X 2)  APTT  BRAIN NATRIURETIC PEPTIDE  LACTIC ACID, PLASMA  CBC  CREATININE, SERUM  PROCALCITONIN  CBC  BLOOD GAS, ARTERIAL  MAGNESIUM  PHOSPHORUS  TROPONIN I (HIGH SENSITIVITY)   ____________________________________________  EKG  Normal sinus rhythm, normal axis, normal intervals, no acute ischemic changes ____________________________________________  RADIOLOGY I, Madelin Headings, personally viewed and evaluated these images (plain radiographs) as part of my medical decision making, as well as reviewing the written report by the radiologist.  ED MD interpretation: I reviewed the chest x-ray which did not show any acute cardiopulmonary process  The CT of the abdomen pelvis which shows a right hip effusion but no other acute abdominal process    ____________________________________________   PROCEDURES  Procedure(s) performed (including Critical Care):  .Critical Care Performed by: Rada Hay, MD Authorized by: Rada Hay, MD   Critical care provider statement:    Critical care time (minutes):  45   Critical care was necessary to treat or prevent imminent or life-threatening deterioration of the following conditions:  Circulatory failure and sepsis   Critical care was time spent  personally by me on the following activities:  Blood draw for specimens, development of treatment plan with patient or surrogate, discussions with consultants, discussions with primary provider, ordering and performing treatments and interventions, ordering and review of laboratory studies, ordering and review of radiographic studies, pulse oximetry, re-evaluation of patient's condition, review of old charts, examination of patient and evaluation of patient's response to treatment   I assumed direction of critical care for this  patient from another provider in my specialty: no     Care discussed with: admitting provider     ____________________________________________   INITIAL IMPRESSION / [place] / ED COURSE     Patient is a 75 year old female presenting with weakness decreased appetite.  She is found to be severely hypotensive initially blood pressure 78G systolic but on repeat in the waiting room she is in the 95A in the systolic.  Overall on exam she is awake alert and oriented.  She has no abdominal symptoms or GI symptoms she does have a cough but no shortness of breath no fevers chills.  Of note patient has nasal packing in place that was placed 4 days ago which I removed.  No facial tenderness no meningismus.  Her labs are notable for leukocytosis of 13 and an AKI with a creatinine 3.1 her lactate up trended from 2.7-3.3.  She was started on Levophed and given fluids concomitantly for her severe hypotension.  Still receiving 30 cc/kg of ideal body weight fluids.  Cultures were obtained.  The UA is negative.  Covered broadly with bank cefepime and Flagyl.  CT abdomen pelvis was obtained looking for potential source given her mildly elevated LFTs which does show a right hip effusion but no other surgical or infectious abdominal process.  Septic arthritis of the right hip certainly possible.  She does have tenderness with range but is able to range.  At this point differential includes toxic shock from the nasal packing, septic arthritis of the hip versus bacteremia from other source.  She will require admission to the ICU.  Clinical Course as of 07/16/21 0220  Tue Jul 16, 2021  0030 Creatinine(!): 3.11 [KM]  0030 WBC(!): 13.4 [KM]  0108 IMPRESSION: 1. No acute intra-abdominal or pelvic pathology. No hydronephrosis or nephrolithiasis. 2. Moderate colonic stool burden. No bowel obstruction. Normal appendix. 3. Severe right hip arthritic changes with protrusion acetabula and fragmentation of the  acetabulum and medial acetabular wall. Right hip effusion. 4. Aortic Atherosclerosis (ICD10-I70.0).     [KM]    Clinical Course User Index [KM] Rada Hay, MD     ____________________________________________   FINAL CLINICAL IMPRESSION(S) / ED DIAGNOSES  Final diagnoses:  Weakness     ED Discharge Orders     None        Note:  This document was prepared using Dragon voice recognition software and may include unintentional dictation errors.    Rada Hay, MD 07/16/21 [phone]

## 2021-07-16 NOTE — Consult Note (Signed)
Becky, Trevino [id number] [birth year] Becky Lipps, MD  Reason for Consult: sepsis  HPI: 75 y.o. female admitted for sepsis.  Concern regarding possible packing in nose as nose was packed on 10/27 by Dr. Tami Trevino after cautery in office.  Patient is somnolent but family deny any current issues with her nose.  Husband reports packing maybe was already removed from left side.  Allergies:  Allergies  Allergen Reactions   Lisinopril Swelling    ROS: Review of systems normal other than 12 systems except per HPI.  PMH:  Past Medical History:  Diagnosis Date   Cervical spondylosis    Chronic kidney disease    Stage 3   DDD (degenerative disc disease), cervical    [facility]   Diabetes mellitus without complication (HCC)    Diverticulosis    Hyperlipidemia    Hypertension    Intertrigo    Leg cramps    Leg varices    Obesity    OSA (obstructive sleep apnea)    Symptomatic menopausal or female climacteric states    Syncope     FH:  Family History  Problem Relation Age of Onset   Diabetes Mother    Hyperlipidemia Mother    Hypertension Mother    Diabetes Father    Hyperlipidemia Father    Hypertension Father    Obesity Father    Hypertension Sister    Hyperlipidemia Sister    Hyperlipidemia Sister    Breast cancer Neg Hx     SH:  Social History   Socioeconomic History   Marital status: Married    Spouse name: Not on file   Number of children: 1   Years of education: Not on file   Highest education level: Not on file  Occupational History   Not on file  Tobacco Use   Smoking status: Never   Smokeless tobacco: Never  Vaping Use   Vaping Use: Never used  Substance and Sexual Activity   Alcohol use: No    Alcohol/week: 0.0 standard drinks   Drug use: No   Sexual activity: Yes    Partners: Male  Other Topics Concern   Not on file  Social History Narrative   Married, one adopted grown child    Social Determinants of Health   Financial Resource  Strain: Low Risk    Difficulty of Paying Living Expenses: Not hard at all  Food Insecurity: No Food Insecurity   Worried About Charity fundraiser in the Last Year: Never true   Arboriculturist in the Last Year: Never true  Transportation Needs: No Transportation Needs   Lack of Transportation (Medical): No   Lack of Transportation (Non-Medical): No  Physical Activity: Inactive   Days of Exercise per Week: 0 days   Minutes of Exercise per Session: 30 min  Stress: No Stress Concern Present   Feeling of Stress : Not at all  Social Connections: Socially Integrated   Frequency of Communication with Friends and Family: More than three times a week   Frequency of Social Gatherings with Friends and Family: More than three times a week   Attends Religious Services: More than 4 times per year   Active Member of [employer] or Organizations: Yes   Attends Archivist Meetings: Never   Marital Status: Married  Human resources officer Violence: Not At Risk   Fear of Current or Ex-Partner: No   Emotionally Abused: No   Physically Abused: No   Sexually Abused: No  PSH:  Past Surgical History:  Procedure Laterality Date   ABDOMINAL HYSTERECTOMY  1993   Total   BUNIONECTOMY Bilateral 1993   I & D KNEE WITH POLY EXCHANGE Right 11/19/2017   Procedure: RIGHT KNEE POLY EXCHANGE WITH IRRIGATION AND DEBRIDEMENT;  Surgeon: Hessie Knows, MD;  Location: [facility];  Service: Orthopedics;  Laterality: Right;   JOINT REPLACEMENT     bilateral knee   LAMINECTOMY  11/14/2013    Cervical Fusion , [facility], Dr. Delilah Shan   LASER ABLATION Bilateral 07/29/2012   Dr. Lucky Cowboy   LOWER EXTREMITY ANGIOGRAPHY Right 11/02/2019   Procedure: Lower Extremity Angiography;  Surgeon: Katha Cabal, MD;  Location: [facility];  Service: Cardiovascular;  Laterality: Right;   ORIF ANKLE FRACTURE Right 09/02/2019   Procedure: OPEN REDUCTION INTERNAL FIXATION (ORIF) ANKLE FRACTURE BIMALLEOLAR;  Surgeon: [name], DPM;   Location: [facility];  Service: Podiatry;  Laterality: Right;   PERIPHERAL VASCULAR THROMBECTOMY Right 03/02/2020   Procedure: PERIPHERAL VASCULAR THROMBECTOMY;  Surgeon: Algernon Huxley, MD;  Location: [facility];  Service: Cardiovascular;  Laterality: Right;   REPLACEMENT TOTAL KNEE Left 03/2010   Dr. Rudene Christians   TOTAL HIP ARTHROPLASTY Left 12/11/2015   Procedure: TOTAL HIP ARTHROPLASTY ANTERIOR APPROACH;  Surgeon: Hessie Knows, MD;  Location: [facility];  Service: Orthopedics;  Laterality: Left;   TOTAL KNEE ARTHROPLASTY Right 03/11/2016   Procedure: TOTAL KNEE ARTHROPLASTY;  Surgeon: Hessie Knows, MD;  Location: [facility];  Service: Orthopedics;  Laterality: Right;    Physical  Exam:  GEN-  supine in bed, somnolent NOSE-  healing cautery area on left anterior septum, no packing in place and no active bleeding OC/OP-  no abnormal masses or lesion   A/P: Epistaxis s/p packing removal prior to my arrival.    Will place on saline spray prn and mupirocin ointment.  Follow up with Dr. Tami Trevino as outpatient.   Becky Trevino 07/16/2021 11:04 PM

## 2021-07-16 NOTE — Progress Notes (Signed)
PHARMACY -  BRIEF ANTIBIOTIC NOTE   Pharmacy has received consult(s) for Cefepime and Vancomycin from an ED provider.  The patient's profile has been reviewed for ht/wt/allergies/indication/available labs.    One time order(s) placed for Cefepime 2 gm and Vancomycin 2 gm per pt wt: 149.7 kg.  Further antibiotics/pharmacy consults should be ordered by admitting physician if indicated.                       Renda Rolls, PharmD, [facility] 07/16/2021 12:48 AM

## 2021-07-16 NOTE — H&P (Addendum)
NAME:  Becky Trevino, MRN:  [id number], DOB:  [birth year], LOS: 0 ADMISSION DATE:  07/16/2021, CONSULTATION DATE:  07/16/2021 REFERRING MD: Willeen Niece  MD CHIEF COMPLAINT:  Hypotension    HPI  75 y.o with significant PMH as below who presented to the ED with chief complaints of hypotension and decreased urine output  Patient report onset of symptoms since several days ago associated with decreased appetite, generalized weakness and nonproductive cough.  Denies abdominal pain, nausea vomiting, diarrhea, fevers or chills.  Patient endorses right hip pain and difficulty walking or weightbearing.  ED Course: On arrival to the ED,  she was afebrile with blood pressure  71/41 mm Hg and pulse rate 133 beats/min, respirations 14 breaths/min and oxygen saturation 92% on room air there were no focal neurological deficits;  Pertinent Labs in Red/Diagnostics Findings: Na+/ K+: 137/4.5 Glucose: 106 BUN/Cr.: 32/3.11 Calcium: 9.0 AST/ALT:   WBC/ TMAX: 13.4/ afebrile Hgb/Hct: 12.9/41.1 Plts: 157 PCT: negative <0.10 Lactic acid: 2.3 COVID PCR: Negative   Troponin: 15 BNP: 78.7 Arterial Blood Gas result:  pO2 70; pCO2 35; pH 7.34;  HCO3 18.9, %O2 Sat 92.7.   EKG: normal sinus rhythm, unchanged from previous tracings. Imaging : CXR:Low lung volumes. Trace pleural effusions not excluded. CT Abd/pel : Severe right hip arthritic changes with protrusion acetabula and fragmentation of the acetabulum and medial acetabular wall. Right hip effusion.  Due to concerns for sepsis, code sepsis was initiated and patient started on broad-spectrum antibiotics following her IV fluid resuscitation.  Patient remained hypotensive despite IV fluid resuscitation therefore was started on Levophed to keep MAP >65 mm Hg.  PCCM consulted Past Medical History   Atherosclerosis of aorta ([place]) 06/07/2021  Anasarca    Hypothyroidism    SVT (supraventricular tachycardia) (HCC)    Acute kidney injury superimposed on CKD  (HCC)    Hypotension    Class 2 obesity without serious comorbidity with body mass index (BMI) of 39.0 to 39.9 in adult    Thrombocytopenia (HCC)    Mild protein-calorie malnutrition ([place])    Hyperbilirubinemia    Injury of left femoral nerve 08/28/2020  History of DVT (deep vein thrombosis) 06/25/2020  Lymphedema 06/25/2020  History of pelvic hematoma 02/10/2020  Foot ulcer, right, limited to breakdown of skin ([place]) 12/02/2019  MRSA bacteremia 10/30/2019  Chronic venous hypertension due to deep vein thrombosis (DVT) 10/30/2019  Hematoma    Normocytic anemia 08/07/2019  Chronic infection of right knee ([place]) 11/25/2018  Infection and inflammatory reaction due to internal joint prosthesis, subsequent encounter 01/25/2018  Infection of total right knee replacement ([place]) 11/19/2017  Type 2 diabetes mellitus with hyperlipidemia ([place]) 11/06/2016  Primary osteoarthritis of knee 03/11/2016  Primary osteoarthritis of left hip 12/11/2015  Benign essential HTN 04/21/2015  Edema leg 04/21/2015  Chronic kidney disease (CKD), stage III (moderate) ([place]) 04/21/2015  Chronic venous insufficiency 04/21/2015  DDD (degenerative disc disease), cervical 04/21/2015  Diabetes mellitus with renal manifestation ([place]) 04/21/2015  Hyperlipidemia 04/21/2015  Bladder cystocele 04/21/2015  Urinary incontinence 04/21/2015  Leg varices 04/21/2015  Insomnia 04/21/2015  Eczema intertrigo 04/21/2015  Obesity, Class III, BMI 40-49.9 (morbid obesity) ([employer]) 04/21/2015  OSA (obstructive sleep apnea) 04/21/2015  Menopausal and perimenopausal disorder 04/21/2015  Supraventricular premature beats 04/21/2015  Osteoarthritis of multiple joints 04/21/2015  H/O Spinal surgery 11/14/2013  Detrusor muscle hypertonia 11/07/2013    Significant Hospital Events   11/1: Admitted to ICU with sepsis with septic shock of unknown source  Consults:  PCCM  Procedures:  None  Significant Diagnostic Tests:  10/31: Chest Xray>  no acute cardiopulmonary process 11/1: CTA abdomen and pelvis >Severe right hip arthritic changes with protrusion acetabula and fragmentation of the acetabulum and medial acetabular wall. Right hip effusion.  Micro Data:  10/31: SARS-CoV-2 PCR> negative 10/31: Influenza PCR> negative 10/31: Blood culture x2> 10/31: Urine Culture> 10/31: MRSA PCR>>   Antimicrobials:  Vancomycin 10/31> Cefepime 10/31>  OBJECTIVE  Blood pressure (!) 105/42, pulse 77, temperature 98.9 F (37.2 C), temperature source Oral, resp. rate 18, height 5\' 11"  (1.803 m), weight (!) 149.7 kg, SpO2 94 %.        Intake/Output Summary (Last 24 hours) at 07/16/2021 0412 Last data filed at 07/16/2021 0220 Gross per 24 hour  Intake 2100 ml  Output --  Net 2100 ml   Filed Weights   07/15/21 1049  Weight: (!) 149.7 kg   Physical Examination  GENERAL: 75 year-old critically ill patient lying in the bed with no acute distress.  EYES: Pupils equal, round, reactive to light and accommodation. No scleral icterus. Extraocular muscles intact.  HEENT: Head atraumatic, normocephalic. Oropharynx and Nasal packing in the left nare, green mucus NECK:  Supple, no jugular venous distention. No thyroid enlargement, no tenderness.  LUNGS: Normal breath sounds bilaterally, no wheezing, rales,rhonchi or crepitation. No use of accessory muscles of respiration.  CARDIOVASCULAR: S1, S2 normal. No murmurs, rubs, or gallops.  ABDOMEN: Soft, nontender, nondistended. Bowel sounds present. No organomegaly or mass.  EXTREMITIES: Bilateral lower extremity with chronic venous stasis changes. Limited ROM in right hip NEUROLOGIC: Cranial nerves II through XII are intact.  Muscle strength 5/5 in all extremities. Sensation intact. Gait not checked.  PSYCHIATRIC: The patient is alert and oriented x 3.  SKIN: No obvious rash, lesion, or ulcer.   Labs/imaging that I havepersonally reviewed  (right click and "Reselect all SmartList Selections"  daily)     Labs   CBC: Recent Labs  Lab 07/15/21 1051  WBC 13.4*  HGB 12.9  HCT 41.1  MCV 97.4  PLT 010    Basic Metabolic Panel: Recent Labs  Lab 07/15/21 1051  NA 137  K 4.5  CL 105  CO2 22  GLUCOSE 106*  BUN 32*  CREATININE 3.11*  CALCIUM 9.0   GFR: Estimated Creatinine Clearance: 25.3 mL/min (A) (by C-G formula based on SCr of 3.11 mg/dL (H)). Recent Labs  Lab 07/15/21 1051 07/15/21 1808 07/15/21 2019 07/16/21 0113  WBC 13.4*  --   --   --   LATICACIDVEN  --  2.3* 2.2* 3.3*    Liver Function Tests: Recent Labs  Lab 07/15/21 1808  AST 46*  ALT 41  ALKPHOS 118  BILITOT 2.0*  PROT 7.1  ALBUMIN 3.3*   No results for input(s): LIPASE, AMYLASE in the last 168 hours. No results for input(s): AMMONIA in the last 168 hours.  ABG    Component Value Date/Time   TCO2 25 09/02/2019 0813     Coagulation Profile: Recent Labs  Lab 07/15/21 1808  INR 1.2    Cardiac Enzymes: No results for input(s): CKTOTAL, CKMB, CKMBINDEX, TROPONINI in the last 168 hours.  HbA1C: Hemoglobin A1C  Date/Time Value Ref Range Status  06/07/2021 10:12 AM 5.5 4.0 - 5.6 % Corrected  02/04/2021 10:52 AM 5.3 4.0 - 5.6 % Final   HbA1c, POC (controlled diabetic range)  Date/Time Value Ref Range Status  05/12/2019 10:33 AM 6.0 0.0 - 7.0 % Final  07/27/2018 08:43 AM 5.7 0.0 - 7.0 % Final  Hgb A1c MFr Bld  Date/Time Value Ref Range Status  10/28/2019 06:23 AM 5.5 4.8 - 5.6 % Final    Comment:    (NOTE) Pre diabetes:          5.7%-6.4% Diabetes:              >6.4% Glycemic control for   <7.0% adults with diabetes     CBG: No results for input(s): GLUCAP in the last 168 hours.  Review of Systems:   Review of Systems  Constitutional:  Positive for malaise/fatigue and weight loss. Negative for chills, diaphoresis and fever.  HENT:  Positive for nosebleeds.   Eyes: Negative.   Respiratory:  Positive for cough. Negative for hemoptysis, sputum production, shortness  of breath and wheezing.   Cardiovascular:  Positive for leg swelling.  Gastrointestinal:  Negative for abdominal pain, blood in stool, constipation, diarrhea, heartburn, melena, nausea and vomiting.  Genitourinary: Negative.   Musculoskeletal:  Positive for back pain, joint pain, myalgias and neck pain. Negative for falls.  Skin:  Negative for itching and rash.  Neurological: Negative.   Endo/Heme/Allergies: Negative.   Psychiatric/Behavioral: Negative.     Past Medical History  She,  has a past medical history of Cervical spondylosis, Chronic kidney disease, DDD (degenerative disc disease), cervical, Diabetes mellitus without complication ([place]), Diverticulosis, Hyperlipidemia, Hypertension, Intertrigo, Leg cramps, Leg varices, Obesity, OSA (obstructive sleep apnea), Symptomatic menopausal or female climacteric states, and Syncope.   Surgical History    Past Surgical History:  Procedure Laterality Date   ABDOMINAL HYSTERECTOMY  1993   Total   BUNIONECTOMY Bilateral 1993   I & D KNEE WITH POLY EXCHANGE Right 11/19/2017   Procedure: RIGHT KNEE POLY EXCHANGE WITH IRRIGATION AND DEBRIDEMENT;  Surgeon: Hessie Knows, MD;  Location: [facility];  Service: Orthopedics;  Laterality: Right;   JOINT REPLACEMENT     bilateral knee   LAMINECTOMY  11/14/2013    Cervical Fusion , [facility], Dr. Delilah Shan   LASER ABLATION Bilateral 07/29/2012   Dr. Lucky Cowboy   LOWER EXTREMITY ANGIOGRAPHY Right 11/02/2019   Procedure: Lower Extremity Angiography;  Surgeon: Katha Cabal, MD;  Location: [facility];  Service: Cardiovascular;  Laterality: Right;   ORIF ANKLE FRACTURE Right 09/02/2019   Procedure: OPEN REDUCTION INTERNAL FIXATION (ORIF) ANKLE FRACTURE BIMALLEOLAR;  Surgeon: [name], DPM;  Location: [facility];  Service: Podiatry;  Laterality: Right;   PERIPHERAL VASCULAR THROMBECTOMY Right 03/02/2020   Procedure: PERIPHERAL VASCULAR THROMBECTOMY;  Surgeon: Algernon Huxley, MD;  Location: [facility];   Service: Cardiovascular;  Laterality: Right;   REPLACEMENT TOTAL KNEE Left 03/2010   Dr. Rudene Christians   TOTAL HIP ARTHROPLASTY Left 12/11/2015   Procedure: TOTAL HIP ARTHROPLASTY ANTERIOR APPROACH;  Surgeon: Hessie Knows, MD;  Location: [facility];  Service: Orthopedics;  Laterality: Left;   TOTAL KNEE ARTHROPLASTY Right 03/11/2016   Procedure: TOTAL KNEE ARTHROPLASTY;  Surgeon: Hessie Knows, MD;  Location: [facility];  Service: Orthopedics;  Laterality: Right;     Social History   reports that she has never smoked. She has never used smokeless tobacco. She reports that she does not drink alcohol and does not use drugs.   Family History   Her family history includes Diabetes in her father and mother; Hyperlipidemia in her father, mother, sister, and sister; Hypertension in her father, mother, and sister; Obesity in her father. There is no history of Breast cancer.   Allergies Allergies  Allergen Reactions   Lisinopril Swelling  Home Medications  Prior to Admission medications   Medication Sig Start Date End Date Taking? Authorizing Provider  acetaminophen (TYLENOL) 650 MG CR tablet Take 650-1,300 mg by mouth 2 (two) times daily as needed for pain.    [provider]  ASPIRIN LOW DOSE 81 MG EC tablet TAKE 1 TABLET BY MOUTH EVERY DAY 01/29/21   Ancil Boozer, Drue Stager, MD  atorvastatin (LIPITOR) 40 MG tablet Take 1 tablet (40 mg total) by mouth every evening. 06/07/21   [name], MD  Calcium Carbonate-Vitamin D 600-400 MG-UNIT tablet Take 1 tablet by mouth 2 (two) times daily. 11/14/20   [name], MD  cephALEXin (KEFLEX) 500 MG capsule Take 500 mg by mouth 3 (three) times daily. 07/11/21   [provider]  doxycycline (VIBRAMYCIN) 100 MG capsule Take 100 mg by mouth 2 (two) times daily. 12/14/20   [provider]  fluticasone (FLONASE) 50 MCG/ACT nasal spray SPRAY 2 SPRAYS INTO EACH NOSTRIL EVERY DAY 05/14/21   Ancil Boozer, Drue Stager, MD  ketoconazole (NIZORAL) 2 % cream Apply 1  application topically 2 (two) times daily as needed (rash).  01/26/15   [provider]  latanoprost (XALATAN) 0.005 % ophthalmic solution SMARTSIG:In Eye(s) 07/12/20   [provider]  levothyroxine (SYNTHROID) 25 MCG tablet TAKE 1 TABLET (25 MCG TOTAL) BY MOUTH DAILY AT 6 (SIX) AM. 05/14/21 06/28/21  [name], MD  Magnesium Oxide 500 MG CAPS Take 500 mg by mouth daily.  11/10/07   [provider]  metoprolol succinate (TOPROL-XL) 25 MG 24 hr tablet TAKE 1 TABLET (25 MG TOTAL) BY MOUTH DAILY. 07/01/21   [name], MD  mirabegron ER (MYRBETRIQ) 50 MG TB24 tablet Take 1 tablet (50 mg total) by mouth daily. 06/28/21   Zara Council A, PA-C  oxyCODONE (OXY IR/ROXICODONE) 5 MG immediate release tablet Take 5 mg by mouth every 4 (four) hours as needed. 02/18/21   Hessie Knows, MD  polyethylene glycol (MIRALAX / GLYCOLAX) 17 g packet Take 17 g by mouth daily. 09/15/20   Antonieta Pert, MD  potassium chloride (KLOR-CON) 10 MEQ tablet Take 10 mEq by mouth daily. 08/18/19   Callwood, Dwayne D, MD  SIMBRINZA 1-0.2 % SUSP Place 1 drop into both eyes 2 (two) times daily. 10/13/19   [provider]  torsemide (DEMADEX) 20 MG tablet Take 1 tablet (20 mg total) by mouth daily. 02/04/21 03/06/21  [name], MD  traZODone (DESYREL) 50 MG tablet Take 0.5-1 tablets (25-50 mg total) by mouth at bedtime as needed for sleep. 02/26/21   [name], MD  Scheduled Meds:  heparin  5,000 Units Subcutaneous Q8H   vancomycin variable dose per unstable renal function (pharmacist dosing)   Does not apply See admin instructions   Continuous Infusions:  [START ON 07/17/2021] ceFEPime (MAXIPIME) IV     norepinephrine (LEVOPHED) Adult infusion 10 mcg/min (07/16/21 0126)   vancomycin 2,000 mg (07/16/21 0224)   PRN Meds:.docusate sodium, polyethylene glycol   Active Hospital Problem list   Sepsis with septic shock  AKI Lactic acidosis DM  Assessment & Plan:  Sepsis with  septic shock due to suspected septic arthritis of right hip? PMHx: Group B streptococcus bacteremia, Left THA, Right TKA with prosthetic joint infection  Lactic: 2.3, Baseline PCT: Pending, UA: Pending, CXR: No acute cardiopulmonary process, CT: shows severe right hip effusion but no other acute abdominal process  Initial interventions/workup included: 2.5 L of NS& Cefepime/ Vancomycin/ Metronidazole -Supplemental oxygen as needed, to maintain SpO2 > 90% -Follow Blood  and Urine cultures, trend lactic/ PCT -Monitor WBC/ fever curve -IV antibiotics: cefepime & vancomycin  -IVF hydration as needed -Continue vasopressors to maintain MAP< 65, norepinephrine -Strict I/O's, -If persistent hypotension consider stress dose steroids  -Consider ID consult given recurrent joint infections   AKI on CKD Stage III -Monitor I&O's / urinary output -Follow BMP -Ensure adequate renal perfusion -Avoid nephrotoxic agents as able -Replace electrolytes as indicated   Chronic Anasarca/Lymphedema with Chronic Diastolic CHF -Hypertension Hx: CAD status post CABG in March 2021, SVT, HLD  -Continuous cardiac monitoring -Maintain MAP greater than 65 -Diuretics as blood pressure and renal function permits; currently on Torsemide at home -Hold metoprolol due to hypotension -Continue Lipitor -Repeat 2D Echocardiogram  Diabetes mellitus -CBGs -Sliding scale insulin -Follow ICU hyper/hypoglycemia protocol -Hold home Meds   Hypothyroidism -Continue Synthroid  OSA not on CPAP  Best practice:  Diet:  Oral Pain/Anxiety/Delirium protocol (if indicated): No VAP protocol (if indicated): Not indicated DVT prophylaxis: Subcutaneous Heparin GI prophylaxis: PPI Glucose control:  SSI Yes Central venous access:  N/A Arterial line:  N/A Foley:  N/A Mobility:  bed rest  PT consulted: N/A Last date of multidisciplinary goals of care discussion [07/16/21] Code Status:  full code Disposition: ICU   = Goals of  Care = Code Status Order: FULL  Primary Emergency ContactPEARLENA, OW, Home Phone: [phone] Wishes to pursue full aggressive treatment and intervention options, including CPR and intubation, but goals of care will be addressed on going with family if that should become necessary.  Critical care time: 45 minutes     Rufina Falco, DNP, CCRN, FNP-C, AGACNP-BC Acute Care Nurse Practitioner  [facility] Pager: [phone] [place] [facility]  .

## 2021-07-16 NOTE — Consult Note (Signed)
[facility] Cardiology  CARDIOLOGY CONSULT NOTE  Patient ID: Becky Trevino MRN: [id number] DOB/AGE: 75-Sep-1947 75 y.o.  Admit date: 07/16/2021 Referring Physician Kasa Primary Physician [name], MD Primary Cardiologist [facility] Reason for Consultation SVT  HPI:  Becky Trevino is a 75 year old female with a history of type 2 diabetes, hypertension, hyperlipidemia, obesity, OSA, SVT, paroxysmal A. fib, chronic DVT of the right lower extremity status post IVC filter who was admitted with septic shock of unknown source.  Cardiology is consulted for SVT at a rate of 160 bpm.  The patient presents with overall weakness for the last several days as well as decreased appetite.  She had no other apparent localizing symptoms.  The patient does have nasal packing in place from an episode of epistaxis that was done on Thursday.  On arrival to the emergency department she was profoundly hypotensive requiring vasopressors to maintain her blood pressure.  This afternoon she developed SVT with a rate of 160 prompting cardiology consultation.  She has multisystem organ failure including altered mental status, acute kidney injury.  The patient follows with Dr. Clayborn Bigness in cardiology for history of SVT.  The patient was seen in June 2022 with tachycardia with a heart rate in the 150s.  This was thought to be SVT, though there is some concern it may be underlying atrial flutter.  She was given 10 mg of IV diltiazem with cardioversion to normal sinus rhythm.  Review of systems complete and found to be negative unless listed above     Past Medical History:  Diagnosis Date   Cervical spondylosis    Chronic kidney disease    Stage 3   DDD (degenerative disc disease), cervical    [facility]   Diabetes mellitus without complication (HCC)    Diverticulosis    Hyperlipidemia    Hypertension    Intertrigo    Leg cramps    Leg varices    Obesity    OSA (obstructive sleep apnea)    Symptomatic  menopausal or female climacteric states    Syncope     Past Surgical History:  Procedure Laterality Date   ABDOMINAL HYSTERECTOMY  1993   Total   BUNIONECTOMY Bilateral 1993   I & D KNEE WITH POLY EXCHANGE Right 11/19/2017   Procedure: RIGHT KNEE POLY EXCHANGE WITH IRRIGATION AND DEBRIDEMENT;  Surgeon: Hessie Knows, MD;  Location: [facility] ORS;  Service: Orthopedics;  Laterality: Right;   JOINT REPLACEMENT     bilateral knee   LAMINECTOMY  11/14/2013    Cervical Fusion , [facility], Dr. Delilah Shan   LASER ABLATION Bilateral 07/29/2012   Dr. Lucky Cowboy   LOWER EXTREMITY ANGIOGRAPHY Right 11/02/2019   Procedure: Lower Extremity Angiography;  Surgeon: Katha Cabal, MD;  Location: [place] [facility];  Service: Cardiovascular;  Laterality: Right;   ORIF ANKLE FRACTURE Right 09/02/2019   Procedure: OPEN REDUCTION INTERNAL FIXATION (ORIF) ANKLE FRACTURE BIMALLEOLAR;  Surgeon: [name], DPM;  Location: [facility] ORS;  Service: Podiatry;  Laterality: Right;   PERIPHERAL VASCULAR THROMBECTOMY Right 03/02/2020   Procedure: PERIPHERAL VASCULAR THROMBECTOMY;  Surgeon: Algernon Huxley, MD;  Location: [place] [facility];  Service: Cardiovascular;  Laterality: Right;   REPLACEMENT TOTAL KNEE Left 03/2010   Dr. Rudene Christians   TOTAL HIP ARTHROPLASTY Left 12/11/2015   Procedure: TOTAL HIP ARTHROPLASTY ANTERIOR APPROACH;  Surgeon: Hessie Knows, MD;  Location: [facility] OR[facility];  Service: Orthopedics;  Laterality: Left;   TOTAL KNEE ARTHROPLASTY Right 03/11/2016   Procedure: TOTAL KNEE ARTHROPLASTY;  Surgeon: Hessie Knows, MD;  Location: [facility];  Service: Orthopedics;  Laterality: Right;    Medications Prior to Admission  Medication Sig Dispense Refill Last Dose   ASPIRIN LOW DOSE 81 MG EC tablet TAKE 1 TABLET BY MOUTH EVERY DAY 90 tablet 0 07/15/2021   Calcium Carbonate-Vitamin D 600-400 MG-UNIT tablet Take 1 tablet by mouth 2 (two) times daily. 60 tablet 0 07/15/2021   cephALEXin (KEFLEX) 500 MG capsule Take 500 mg by mouth 3 (three)  times daily.   07/15/2021   doxycycline (VIBRAMYCIN) 100 MG capsule Take 100 mg by mouth 2 (two) times daily.   07/15/2021   levothyroxine (SYNTHROID) 25 MCG tablet TAKE 1 TABLET (25 MCG TOTAL) BY MOUTH DAILY AT 6 (SIX) AM. 90 tablet 1 07/15/2021   Magnesium Oxide 500 MG CAPS Take 500 mg by mouth daily.    07/15/2021   metoprolol succinate (TOPROL-XL) 25 MG 24 hr tablet TAKE 1 TABLET (25 MG TOTAL) BY MOUTH DAILY. 90 tablet 1 07/15/2021   mirabegron ER (MYRBETRIQ) 50 MG TB24 tablet Take 1 tablet (50 mg total) by mouth daily. 90 tablet 3 07/15/2021   oxyCODONE (OXY IR/ROXICODONE) 5 MG immediate release tablet Take 5 mg by mouth every 4 (four) hours as needed.   07/15/2021   potassium chloride (KLOR-CON) 10 MEQ tablet Take 10 mEq by mouth daily.   07/15/2021   SIMBRINZA 1-0.2 % SUSP Place 1 drop into both eyes 2 (two) times daily.   07/15/2021   acetaminophen (TYLENOL) 650 MG CR tablet Take 650-1,300 mg by mouth 2 (two) times daily as needed for pain.   prn at prn   atorvastatin (LIPITOR) 40 MG tablet Take 1 tablet (40 mg total) by mouth every evening. 90 tablet 1 07/14/2021   fluticasone (FLONASE) 50 MCG/ACT nasal spray SPRAY 2 SPRAYS INTO EACH NOSTRIL EVERY DAY 48 mL 0 prn at prn   ketoconazole (NIZORAL) 2 % cream Apply 1 application topically 2 (two) times daily as needed (rash).    prn at prn   latanoprost (XALATAN) 0.005 % ophthalmic solution SMARTSIG:In Eye(s)   07/14/2021   polyethylene glycol (MIRALAX / GLYCOLAX) 17 g packet Take 17 g by mouth daily. 14 each 0 prn at prn   torsemide (DEMADEX) 20 MG tablet Take 1 tablet (20 mg total) by mouth daily. 90 tablet 1    traZODone (DESYREL) 50 MG tablet Take 0.5-1 tablets (25-50 mg total) by mouth at bedtime as needed for sleep. 90 tablet 1 07/14/2021   Social History   Socioeconomic History   Marital status: Married    Spouse name: Not on file   Number of children: 1   Years of education: Not on file   Highest education level: Not on file   Occupational History   Not on file  Tobacco Use   Smoking status: Never   Smokeless tobacco: Never  Vaping Use   Vaping Use: Never used  Substance and Sexual Activity   Alcohol use: No    Alcohol/week: 0.0 standard drinks   Drug use: No   Sexual activity: Yes    Partners: Male  Other Topics Concern   Not on file  Social History Narrative   Married, one adopted grown child    Social Determinants of Health   Financial Resource Strain: Low Risk    Difficulty of Paying Living Expenses: Not hard at all  Food Insecurity: No Food Insecurity   Worried About Estate manager/land agent of Food in the Last Year: Never true  Ran Out of Food in the Last Year: Never true  Transportation Needs: No Transportation Needs   Lack of Transportation (Medical): No   Lack of Transportation (Non-Medical): No  Physical Activity: Inactive   Days of Exercise per Week: 0 days   Minutes of Exercise per Session: 30 min  Stress: No Stress Concern Present   Feeling of Stress : Not at all  Social Connections: Socially Integrated   Frequency of Communication with Friends and Family: More than three times a week   Frequency of Social Gatherings with Friends and Family: More than three times a week   Attends Religious Services: More than 4 times per year   Active Member of [employer] or Organizations: Yes   Attends Archivist Meetings: Never   Marital Status: Married  Human resources officer Violence: Not At Risk   Fear of Current or Ex-Partner: No   Emotionally Abused: No   Physically Abused: No   Sexually Abused: No    Family History  Problem Relation Age of Onset   Diabetes Mother    Hyperlipidemia Mother    Hypertension Mother    Diabetes Father    Hyperlipidemia Father    Hypertension Father    Obesity Father    Hypertension Sister    Hyperlipidemia Sister    Hyperlipidemia Sister    Breast cancer Neg Hx       Review of systems complete and found to be negative unless listed above      PHYSICAL  EXAM  Vitals:   07/16/21 1615 07/16/21 1630  BP: (!) 81/55 (!) 91/53  Pulse: (!) 156 92  Resp: (!) 25 14  Temp:    SpO2: 95% 95%     General: lethargic. Does not respond to voice.  HEENT:  Normocephalic and atramatic Neck:  No JVD.  Lungs: Clear bilaterally to auscultation and percussion. Heart: HRRR . Normal S1 and S2 without gallops or murmurs.  Abdomen: Bowel sounds are positive, abdomen soft and non-tender  Msk:  Back normal, normal gait. Normal strength and tone for age. Extremities: Chronic venous stasis changes.  Neuro: Alert and oriented X 3. Psych:  Good affect, responds appropriately  Labs:   Lab Results  Component Value Date   WBC 13.5 (H) 07/16/2021   HGB 13.4 07/16/2021   HCT 40.4 07/16/2021   MCV 96.9 07/16/2021   PLT 150 07/16/2021    Recent Labs  Lab 07/15/21 1051 07/15/21 1808 07/16/21 0633  NA 137  --   --   K 4.5  --   --   CL 105  --   --   CO2 22  --   --   BUN 32*  --   --   CREATININE 3.11*  --  3.47*  CALCIUM 9.0  --   --   PROT  --  7.1  --   BILITOT  --  2.0*  --   ALKPHOS  --  118  --   ALT  --  41  --   AST  --  46*  --   GLUCOSE 106*  --   --    Lab Results  Component Value Date   CKTOTAL 194 (H) 11/14/2020   TROPONINI < 0.02 05/22/2012    Lab Results  Component Value Date   CHOL 157 02/04/2021   CHOL 152 01/27/2020   CHOL 148 11/25/2018   Lab Results  Component Value Date   HDL 73 02/04/2021   HDL 69 01/27/2020  HDL 73 11/25/2018   Lab Results  Component Value Date   LDLCALC 69 02/04/2021   LDLCALC 64 01/27/2020   LDLCALC 62 11/25/2018   Lab Results  Component Value Date   TRIG 71 02/04/2021   TRIG 103 01/27/2020   TRIG 51 11/25/2018   Lab Results  Component Value Date   CHOLHDL 2.2 02/04/2021   CHOLHDL 2.2 01/27/2020   CHOLHDL 2.0 11/25/2018   No results found for: LDLDIRECT    Radiology: CT ABDOMEN PELVIS WO CONTRAST  Result Date: 07/16/2021 CLINICAL DATA:  Abdominal pain and fever. EXAM: CT  ABDOMEN AND PELVIS WITHOUT CONTRAST TECHNIQUE: Multidetector CT imaging of the abdomen and pelvis was performed following the standard protocol without IV contrast. COMPARISON:  Pelvic CT dated 01/09/2020 and CT abdomen pelvis dated 11/01/2019. FINDINGS: Evaluation of this exam is limited in the absence of intravenous contrast. Lower chest: Bibasilar atelectasis/scarring. Coronary vascular calcification. No intra-abdominal free air or free fluid. Hepatobiliary: The liver is unremarkable. No intrahepatic biliary dilatation. Layering high attenuating content within the gallbladder may represent sludge or small stones. No pericholecystic fluid or evidence of acute cholecystitis by CT. Pancreas: Unremarkable. No pancreatic ductal dilatation or surrounding inflammatory changes. Spleen: Normal in size without focal abnormality. Adrenals/Urinary Tract: The adrenal glands are unremarkable. There is no hydronephrosis or nephrolithiasis on either side. A 1 cm right renal upper pole hypodense lesion is suboptimally characterized. The visualized ureters appear unremarkable. The urinary bladder is poorly visualized and obscured by streak artifact caused by left hip arthroplasty. Stomach/Bowel: Moderate stool throughout the colon. There is a 2 cm duodenal diverticulum without active inflammatory changes. There is no bowel obstruction or active inflammation. The appendix is normal. Vascular/Lymphatic: Moderate aortoiliac atherosclerotic disease. An infrarenal IVC filter noted. There is no adenopathy. Reproductive: The uterus is not visualized. Other: None Musculoskeletal: Degenerative changes of the spine and scoliosis. Severe right hip arthritic changes with protrusio acetabula and fragmentation of the acetabulum and medial acetabular wall. Total left hip arthroplasty. No acute osseous pathology. Right hip effusion. IMPRESSION: 1. No acute intra-abdominal or pelvic pathology. No hydronephrosis or nephrolithiasis. 2. Moderate  colonic stool burden. No bowel obstruction. Normal appendix. 3. Severe right hip arthritic changes with protrusion acetabula and fragmentation of the acetabulum and medial acetabular wall. Right hip effusion. 4. Aortic Atherosclerosis (ICD10-I70.0). Electronically Signed   By: Anner Crete M.D.   On: 07/16/2021 00:58   DG Chest 1 View  Result Date: 07/15/2021 CLINICAL DATA:  Pt reports she is only here to "get checked out to see if have COVID or flu". States she has been feeling weak for the past few days but feels better today. Reports decreased eating, drinking, urine output. Last urine output this am. EXAM: CHEST  1 VIEW COMPARISON:  Chest x-ray 02/23/2021, CT chest 02/23/2021 FINDINGS: The heart and mediastinal contours are unchanged. Low lung volumes. No focal consolidation. No pulmonary edema. Trace pleural effusions not excluded. No pneumothorax. No acute osseous abnormality. Severe degenerative changes of the shoulders bilaterally. IMPRESSION: Low lung volumes.  Trace pleural effusions not excluded. Electronically Signed   By: Iven Finn M.D.   On: 07/15/2021 17:30    EKG: SVT at rate of 160 bpm  Echocardiogram 2D complete: (09/07/2020) IMPRESSIONS   1. Left ventricular ejection fraction, by estimation, is 60 to 65%. The left ventricle has normal function. The left ventricle has no regional wall motion abnormalities. Left ventricular diastolic parameters were normal.   2. Right ventricular systolic function is normal. The right  ventricular size is normal.   3. The mitral valve is normal in structure. Trivial mitral valve regurgitation.   4. The aortic valve is normal in structure. Aortic valve regurgitation is not visualized.   ASSESSMENT AND PLAN:  Becky Trevino is a 75 year old female with a history of type 2 diabetes, hypertension, hyperlipidemia, obesity, OSA, SVT, paroxysmal A. fib, chronic DVT of the right lower extremity status post IVC filter who was admitted with septic  shock of unknown source.  Cardiology is consulted for SVT at a rate of 160 bpm.  # SVT #Multisystem organ failure She has a previous history of SVT, though in the past there has been some consideration that this may in fact be atrial flutter.  She developed SVT today with a rate of 160 in the setting of multisystem organ failure which resolved with 6 mg of IV adenosine.  Per report there were no apparent flutter waves prior to cardioversion. -Adenosine as needed for recurrent SVT -When shock resolves, recommend resumption of home metoprolol -If SVT becomes incessant or more frequent consider initiation of amiodarone while patient is in shock.  Signed: Andrez Grime MD 07/16/2021, 3:57 PM

## 2021-07-16 NOTE — Consult Note (Signed)
NAME: Becky Trevino  DOB: [birth year]  MRN: [id number]  Date/Time: 07/16/2021 10:37 AM  REQUESTING PROVIDER: Temple Pacini Subjective:  REASON FOR CONSULT: Sepsis ?pt is a limited historian- chart reviewed Becky Trevino is a 75 y.o. female  with right prosthetic knee infection, diabetes mellitus, hyperlipidemia, hypertension, degenerative disc disease with left THA 12/11/2015, left gluteal pelvic hematoma after a fall in Nov 2020, cervical decompression surgery march 2015, DVT rt lower extremity , has IVC filter- off Anticoagulation due to hematoma, lymphedema legs Pt presented from home to the ED brought in by EMS on 07/15/21 for weakness legs, inability to walk , low grade fever , BP of 74/32 ,not eating or drinking well with reduced urine output of 2 days duration . Vitals in the ED temp 98.9, BP 99/80, RR 20 , HR 89, Sats 100% WBC 13.4, Cr 3.11 Hb 12.9 Blood culture sent, CXR no pneumonia, she was started on vanco/cefepime and flagyl. She was admitted to ICU on pressor and stress dose steroids CT abdomen and pelvis done today and it showed  Degenerative changes of the spine and scoliosis. Severe right hip arthritic changes with protrusio acetabula and fragmentation of the acetabulum and medial acetabular wall. Total left hip arthroplasty. No acute osseous pathology. Right hip effusion.  I am asked to see her for sepsis  Patient has a complicated Infection history  Last hospitalization 09/02/20-09/14/20 for left upper thigh cellulitis, MRSA bacteremia and group B strep bacteremia and left lower lobe infiltrate due to MRSA She got IV vanco followed by Daptomycin for a total of 22 days  Feb 2021- Group B strep bacteremia Rt ankle fracture ORIF Dec 2020-  non healing wound with GBS and acinetobacter in Feb 2021- treated with 4 weeks of Iv antibiotic- unasyn -11/25/19  Was in the [place] between 08/07/19-08/09/19 after  she passed out at work and hit a metal pole She developed a left gluteal  hematoma and 295 ml of dark purple blood aspirated by ortho on Dec , 11 2020 - no culture  In 2017 had rt knee replacement   In February 2019 she had swelling of the knee and an aspiration revealed 59,000 WBCs with 98% polymorphs.  Cultures were negative as well as for crystal analysis.  Sed rate was 76.  On March 7 , 2019 she had an incision and drainage and polyethylene exchange of the right knee.  The cultures grew coag negative staph.  PICC line was placed and she was given vancomycin and ceftriaxone for 6 weeks,  , culture had coag neg staph, was on suppressive doxy + rifampin because of retained prosthesis. Since  oct 2019  has been on Doxy suppressive therapy  Pt had epistaxis last week and had seen ENT on Thursday and had nose packed. She started feeling unwell Friday evening. Pt denies any cough, sob headache, diarrhea, pain abdomen Chronic pain both hips She lives with her husband- has a walker but very little mobility  Past Medical History:  Diagnosis Date   Cervical spondylosis    Chronic kidney disease    Stage 3   DDD (degenerative disc disease), cervical    [facility]   Diabetes mellitus without complication ([place])    Diverticulosis    Hyperlipidemia    Hypertension    Intertrigo    Leg cramps    Leg varices    Obesity    OSA (obstructive sleep apnea)    Symptomatic menopausal or female climacteric states    Syncope  Past Surgical History:  Procedure Laterality Date   ABDOMINAL HYSTERECTOMY  1993   Total   BUNIONECTOMY Bilateral 1993   I & D KNEE WITH POLY EXCHANGE Right 11/19/2017   Procedure: RIGHT KNEE POLY EXCHANGE WITH IRRIGATION AND DEBRIDEMENT;  Surgeon: Hessie Knows, MD;  Location: [facility];  Service: Orthopedics;  Laterality: Right;   JOINT REPLACEMENT     bilateral knee   LAMINECTOMY  11/14/2013    Cervical Fusion , [facility], Dr. Delilah Shan   LASER ABLATION Bilateral 07/29/2012   Dr. Lucky Cowboy   LOWER EXTREMITY ANGIOGRAPHY Right 11/02/2019   Procedure:  Lower Extremity Angiography;  Surgeon: Katha Cabal, MD;  Location: [place] [facility];  Service: Cardiovascular;  Laterality: Right;   ORIF ANKLE FRACTURE Right 09/02/2019   Procedure: OPEN REDUCTION INTERNAL FIXATION (ORIF) ANKLE FRACTURE BIMALLEOLAR;  Surgeon: [name], DPM;  Location: [facility];  Service: Podiatry;  Laterality: Right;   PERIPHERAL VASCULAR THROMBECTOMY Right 03/02/2020   Procedure: PERIPHERAL VASCULAR THROMBECTOMY;  Surgeon: Algernon Huxley, MD;  Location: [place] [facility];  Service: Cardiovascular;  Laterality: Right;   REPLACEMENT TOTAL KNEE Left 03/2010   Dr. Rudene Christians   TOTAL HIP ARTHROPLASTY Left 12/11/2015   Procedure: TOTAL HIP ARTHROPLASTY ANTERIOR APPROACH;  Surgeon: Hessie Knows, MD;  Location: [facility];  Service: Orthopedics;  Laterality: Left;   TOTAL KNEE ARTHROPLASTY Right 03/11/2016   Procedure: TOTAL KNEE ARTHROPLASTY;  Surgeon: Hessie Knows, MD;  Location: [facility];  Service: Orthopedics;  Laterality: Right;    Social History   Socioeconomic History   Marital status: Married    Spouse name: Not on file   Number of children: 1   Years of education: Not on file   Highest education level: Not on file  Occupational History   Not on file  Tobacco Use   Smoking status: Never   Smokeless tobacco: Never  Vaping Use   Vaping Use: Never used  Substance and Sexual Activity   Alcohol use: No    Alcohol/week: 0.0 standard drinks   Drug use: No   Sexual activity: Yes    Partners: Male  Other Topics Concern   Not on file  Social History Narrative   Married, one adopted grown child    Social Determinants of Radio broadcast assistant Strain: Low Risk    Difficulty of Paying Living Expenses: Not hard at all  Food Insecurity: No Food Insecurity   Worried About Charity fundraiser in the Last Year: Never true   Arboriculturist in the Last Year: Never true  Transportation Needs: No Transportation Needs   Lack of Transportation (Medical): No   Lack  of Transportation (Non-Medical): No  Physical Activity: Inactive   Days of Exercise per Week: 0 days   Minutes of Exercise per Session: 30 min  Stress: No Stress Concern Present   Feeling of Stress : Not at all  Social Connections: Socially Integrated   Frequency of Communication with Friends and Family: More than three times a week   Frequency of Social Gatherings with Friends and Family: More than three times a week   Attends Religious Services: More than 4 times per year   Active Member of [employer] or Organizations: Yes   Attends Archivist Meetings: Never   Marital Status: Married  Human resources officer Violence: Not At Risk   Fear of Current or Ex-Partner: No   Emotionally Abused: No   Physically Abused: No   Sexually Abused: No  Family History  Problem Relation Age of Onset   Diabetes Mother    Hyperlipidemia Mother    Hypertension Mother    Diabetes Father    Hyperlipidemia Father    Hypertension Father    Obesity Father    Hypertension Sister    Hyperlipidemia Sister    Hyperlipidemia Sister    Breast cancer Neg Hx    Allergies  Allergen Reactions   Lisinopril Swelling   I? Current Facility-Administered Medications  Medication Dose Route Frequency Provider Last Rate Last Admin   aspirin EC tablet 81 mg  81 mg Oral Daily Ouma, Bing Neighbors, NP       atorvastatin (LIPITOR) tablet 40 mg  40 mg Oral QPM Lang Snow, NP       [START ON 07/17/2021] ceFEPIme (MAXIPIME) 2 g in sodium chloride 0.9 % 100 mL IVPB  2 g Intravenous Q24H Belue, Nathan S, RPH       docusate sodium (COLACE) capsule 100 mg  100 mg Oral BID PRN Lang Snow, NP       fluticasone (FLONASE) 50 MCG/ACT nasal spray 1 spray  1 spray Each Nare Daily Lang Snow, NP   1 spray at 07/16/21 1013   heparin injection 5,000 Units  5,000 Units Subcutaneous Q8H Ouma, Bing Neighbors, NP       insulin aspart (novoLOG) injection 0-9 Units  0-9 Units Subcutaneous Q4H  Lang Snow, NP       levothyroxine (SYNTHROID) tablet 25 mcg  25 mcg Oral Q0600 Lang Snow, NP       midodrine (PROAMATINE) tablet 10 mg  10 mg Oral TID WC [name], MD       norepinephrine (LEVOPHED) 4mg  in 281mL premix infusion  0-40 mcg/min Intravenous Continuous Rada Hay, MD 37.5 mL/hr at 07/16/21 0807 10 mcg/min at 07/16/21 0807   polyethylene glycol (MIRALAX / GLYCOLAX) packet 17 g  17 g Oral Daily PRN Lang Snow, NP       vancomycin variable dose per unstable renal function (pharmacist dosing)   Does not apply See admin instructions Renda Rolls, RPH         Abtx:  Anti-infectives (From admission, onward)    Start     Dose/Rate Route Frequency Ordered Stop   07/17/21 0000  ceFEPIme (MAXIPIME) 2 g in sodium chloride 0.9 % 100 mL IVPB        2 g 200 mL/hr over 30 Minutes Intravenous Every 24 hours 07/16/21 0220     07/16/21 0220  vancomycin variable dose per unstable renal function (pharmacist dosing)         Does not apply See admin instructions 07/16/21 0220     07/16/21 0045  ceFEPIme (MAXIPIME) 2 g in sodium chloride 0.9 % 100 mL IVPB        2 g 200 mL/hr over 30 Minutes Intravenous  Once 07/16/21 0033 07/16/21 0219   07/16/21 0045  metroNIDAZOLE (FLAGYL) IVPB 500 mg        500 mg 100 mL/hr over 60 Minutes Intravenous  Once 07/16/21 0033 07/16/21 0219   07/16/21 0045  vancomycin (VANCOCIN) IVPB 1000 mg/200 mL premix  Status:  Discontinued        1,000 mg 200 mL/hr over 60 Minutes Intravenous  Once 07/16/21 0033 07/16/21 0041   07/16/21 0045  vancomycin (VANCOREADY) IVPB 2000 mg/400 mL        2,000 mg 200 mL/hr over 120 Minutes Intravenous  Once 07/16/21 0041  07/16/21 0435       REVIEW OF SYSTEMS:  Const: low grade  fever, negative chills, negative weight loss Eyes: negative diplopia or visual changes, negative eye pain ENT: negative coryza, negative sore throat positive for epistaxis Resp: negative cough,  hemoptysis, dyspnea Cards: negative for chest pain, palpitations, has lower extremity edema GU: has incontinence , negative for frequency, dysuria and hematuria GI: Negative for abdominal pain, diarrhea, bleeding, constipation Skin: negative for rash and pruritus Heme: negative for easy bruising and gum/nose bleeding MS: generalized weakness B/l hip pain chronic- no  exacerbation Neurolo:has dizziness  Psych: negative for feelings of anxiety, depression  Endocrine:  thyroid, diabetes Allergy/Immunology-lisinopril- swelling Objective:  VITALS:  BP 90/69   Pulse 93   Temp 99 F (37.2 C) (Oral)   Resp 17   Ht 5\' 11"  (1.803 m)   Wt 122 kg   SpO2 95%   BMI 37.51 kg/m  PHYSICAL EXAM:  General: lethargic, but awake and responding to commands appropriately- oriented in place, person, year, month Morbid obesity  Head: Normocephalic, without obvious abnormality, atraumatic. Eyes: Conjunctivae clear, anicteric sclerae. Pupils are equal ENT Nares normal. No drainage or sinus tenderness. Tongue coated Neck: Supple, symmetrical, no adenopathy, thyroid: non tender no carotid bruit and no JVD. Back: No CVA tenderness. Lungs: b/l air entry- decreased air entry bases Heart:s1s2 Abdomen: Soft, non-tender,not distended. Bowel sounds normal. No masses Extremities: b/l knee surgical scars Left THA scar chronic lymphedema legs No wounds,no erythema Skin: No rashes or lesions. Or bruising Lymph: Cervical, supraclavicular normal. Neurologic: Grossly non-focal Pertinent Labs Lab Results CBC    Component Value Date/Time   WBC 13.5 (H) 07/16/2021 0633   RBC 4.17 07/16/2021 0633   HGB 13.4 07/16/2021 0633   HGB 13.2 11/26/2015 1122   HCT 40.4 07/16/2021 0633   HCT 40.9 11/26/2015 1122   PLT 150 07/16/2021 0633   PLT 224 11/26/2015 1122   MCV 96.9 07/16/2021 0633   MCV 96 11/26/2015 1122   MCV 96 10/18/2012 1716   MCH 32.1 07/16/2021 0633   MCHC 33.2 07/16/2021 0633   RDW 16.4 (H)  07/16/2021 0633   RDW 15.1 11/26/2015 1122   RDW 15.2 (H) 10/18/2012 1716   LYMPHSABS 1.0 07/05/2021 0936   LYMPHSABS 2.3 11/26/2015 1122   MONOABS 0.4 07/05/2021 0936   EOSABS 0.2 07/05/2021 0936   EOSABS 0.3 11/26/2015 1122   BASOSABS 0.0 07/05/2021 0936   BASOSABS 0.0 11/26/2015 1122    CMP Latest Ref Rng & Units 07/16/2021 07/15/2021 07/05/2021  Glucose 70 - 99 mg/dL - 106(H) 101(H)  BUN 8 - 23 mg/dL - 32(H) 21  Creatinine 0.44 - 1.00 mg/dL 3.47(H) 3.11(H) 1.29(H)  Sodium 135 - 145 mmol/L - 137 137  Potassium 3.5 - 5.1 mmol/L - 4.5 4.0  Chloride 98 - 111 mmol/L - 105 98  CO2 22 - 32 mmol/L - 22 30  Calcium 8.9 - 10.3 mg/dL - 9.0 9.6  Total Protein 6.5 - 8.1 g/dL - 7.1 8.6(H)  Total Bilirubin 0.3 - 1.2 mg/dL - 2.0(H) 1.0  Alkaline Phos 38 - 126 U/L - 118 109  AST 15 - 41 U/L - 46(H) 25  ALT 0 - 44 U/L - 41 18      Microbiology: Recent Results (from the past 240 hour(s))  Resp Panel by RT-PCR (Flu A&B, Covid) Nasopharyngeal Swab     Status: None   Collection Time: 07/15/21  2:04 PM   Specimen: Nasopharyngeal Swab; Nasopharyngeal(NP) swabs in vial transport  medium  Result Value Ref Range Status   SARS Coronavirus 2 by RT PCR NEGATIVE NEGATIVE Final    Comment: (NOTE) SARS-CoV-2 target nucleic acids are NOT DETECTED.  The SARS-CoV-2 RNA is generally detectable in upper respiratory specimens during the acute phase of infection. The lowest concentration of SARS-CoV-2 viral copies this assay can detect is 138 copies/mL. A negative result does not preclude SARS-Cov-2 infection and should not be used as the sole basis for treatment or other patient management decisions. A negative result may occur with  improper specimen collection/handling, submission of specimen other than nasopharyngeal swab, presence of viral mutation(s) within the areas targeted by this assay, and inadequate number of viral copies(<138 copies/mL). A negative result must be combined with clinical  observations, patient history, and epidemiological information. The expected result is Negative.  Fact Sheet for Patients:  EntrepreneurPulse.com.au  Fact Sheet for Healthcare Providers:  IncredibleEmployment.be  This test is no t yet approved or cleared by the [place] FDA and  has been authorized for detection and/or diagnosis of SARS-CoV-2 by FDA under an Emergency Use Authorization (EUA). This EUA will remain  in effect (meaning this test can be used) for the duration of the COVID-19 declaration under Section 564(b)(1) of the Act, 21 U.S.C.section 360bbb-3(b)(1), unless the authorization is terminated  or revoked sooner.       Influenza A by PCR NEGATIVE NEGATIVE Final   Influenza B by PCR NEGATIVE NEGATIVE Final    Comment: (NOTE) The Xpert Xpress SARS-CoV-2/FLU/RSV plus assay is intended as an aid in the diagnosis of influenza from Nasopharyngeal swab specimens and should not be used as a sole basis for treatment. Nasal washings and aspirates are unacceptable for Xpert Xpress SARS-CoV-2/FLU/RSV testing.  Fact Sheet for Patients: EntrepreneurPulse.com.au  Fact Sheet for Healthcare Providers: IncredibleEmployment.be  This test is not yet approved or cleared by the [place] FDA and has been authorized for detection and/or diagnosis of SARS-CoV-2 by FDA under an Emergency Use Authorization (EUA). This EUA will remain in effect (meaning this test can be used) for the duration of the COVID-19 declaration under Section 564(b)(1) of the Act, 21 U.S.C. section 360bbb-3(b)(1), unless the authorization is terminated or revoked.  Performed at [facility], [address] [place], [place] [address]   Blood culture (routine single)     Status: None (Preliminary result)   Collection Time: 07/15/21  6:08 PM   Specimen: BLOOD  Result Value Ref Range Status   Specimen Description BLOOD  RIGHT ANTECUBITAL  Final   Special Requests   Final    BOTTLES DRAWN AEROBIC AND ANAEROBIC Blood Culture adequate volume   Culture   Final    NO GROWTH < 12 HOURS Performed at [facility], [address], [place], [place] [address]    Report Status PENDING  Incomplete  Blood culture (routine x 2)     Status: None (Preliminary result)   Collection Time: 07/16/21  1:04 AM   Specimen: BLOOD  Result Value Ref Range Status   Specimen Description BLOOD RIGHT ASSIST CONTROL  Final   Special Requests   Final    BOTTLES DRAWN AEROBIC AND ANAEROBIC Blood Culture results may not be optimal due to an inadequate volume of blood received in culture bottles   Culture   Final    NO GROWTH < 12 HOURS Performed at [facility], [address], [place], [place] [address]    Report Status PENDING  Incomplete  Resp Panel by RT-PCR (Flu  A&B, Covid) Nasopharyngeal Swab     Status: None   Collection Time: 07/16/21  1:21 AM   Specimen: Nasopharyngeal Swab; Nasopharyngeal(NP) swabs in vial transport medium  Result Value Ref Range Status   SARS Coronavirus 2 by RT PCR NEGATIVE NEGATIVE Final    Comment: (NOTE) SARS-CoV-2 target nucleic acids are NOT DETECTED.  The SARS-CoV-2 RNA is generally detectable in upper respiratory specimens during the acute phase of infection. The lowest concentration of SARS-CoV-2 viral copies this assay can detect is 138 copies/mL. A negative result does not preclude SARS-Cov-2 infection and should not be used as the sole basis for treatment or other patient management decisions. A negative result may occur with  improper specimen collection/handling, submission of specimen other than nasopharyngeal swab, presence of viral mutation(s) within the areas targeted by this assay, and inadequate number of viral copies(<138 copies/mL). A negative result must be combined with clinical observations, patient history, and epidemiological information. The expected  result is Negative.  Fact Sheet for Patients:  EntrepreneurPulse.com.au  Fact Sheet for Healthcare Providers:  IncredibleEmployment.be  This test is no t yet approved or cleared by the [place] FDA and  has been authorized for detection and/or diagnosis of SARS-CoV-2 by FDA under an Emergency Use Authorization (EUA). This EUA will remain  in effect (meaning this test can be used) for the duration of the COVID-19 declaration under Section 564(b)(1) of the Act, 21 U.S.C.section 360bbb-3(b)(1), unless the authorization is terminated  or revoked sooner.       Influenza A by PCR NEGATIVE NEGATIVE Final   Influenza B by PCR NEGATIVE NEGATIVE Final    Comment: (NOTE) The Xpert Xpress SARS-CoV-2/FLU/RSV plus assay is intended as an aid in the diagnosis of influenza from Nasopharyngeal swab specimens and should not be used as a sole basis for treatment. Nasal washings and aspirates are unacceptable for Xpert Xpress SARS-CoV-2/FLU/RSV testing.  Fact Sheet for Patients: EntrepreneurPulse.com.au  Fact Sheet for Healthcare Providers: IncredibleEmployment.be  This test is not yet approved or cleared by the [place] FDA and has been authorized for detection and/or diagnosis of SARS-CoV-2 by FDA under an Emergency Use Authorization (EUA). This EUA will remain in effect (meaning this test can be used) for the duration of the COVID-19 declaration under Section 564(b)(1) of the Act, 21 U.S.C. section 360bbb-3(b)(1), unless the authorization is terminated or revoked.  Performed at [facility], [address] [place], [place] [address]   MRSA Next Gen by PCR, Nasal     Status: Abnormal   Collection Time: 07/16/21  8:00 AM   Specimen: Nasal Mucosa; Nasal Swab  Result Value Ref Range Status   MRSA by PCR Next Gen DETECTED (A) NOT DETECTED Final    Comment: RESULT CALLED TO, READ BACK BY AND VERIFIED  WITH: SHEIKA BAYNES 07/16/21 1004 KLW (NOTE) The GeneXpert MRSA Assay (FDA approved for NASAL specimens only), is one component of a comprehensive MRSA colonization surveillance program. It is not intended to diagnose MRSA infection nor to guide or monitor treatment for MRSA infections. Test performance is not FDA approved in patients less than 55 years old. Performed at [facility], [address] [place], [place] [address]     IMAGING RESULTS:  I have personally reviewed the films ?Low lung volumes. No focal consolidation. No pulmonary edema. Trace pleural effusions not excluded. No pneumothorax.  Impression/Recommendation ? 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 ___________________________________________________ Discussed with patient, requesting provider Note:  This document was prepared using Dragon voice recognition software and may include unintentional dictation errors.

## 2021-07-16 NOTE — Progress Notes (Signed)
CODE SEPSIS - PHARMACY COMMUNICATION  **Broad Spectrum Antibiotics should be administered within 1 hour of Sepsis diagnosis**  Time Code Sepsis Called/Page Received: 0039  Antibiotics Ordered: Cefepime, Flagyl, and Vancomycin  Time of 1st antibiotic administration: [place], PharmD, [facility] 07/16/2021 12:47 AM

## 2021-07-16 NOTE — Progress Notes (Signed)
GOALS OF CARE DISCUSSION  The Clinical status was relayed to family in detail. Explained  that patient has elevated heart rate (SVT)and needs adenosine for therapy as directed by cardiology  Adenosine 6 mg IV given x 1, HR decreased 160-->103  Updated and notified of patients medical condition.  PATIENT REMAINS FULL CODE No need for CPR or cardioversion  Family understands the situation.  Family are satisfied with Plan of action and management. All questions answered  Additional CC time 20 mins   Gloristine Turrubiates Patricia Pesa, M.D.  [facility]  Medical Director [place] Director [facility] Cardio-Pulmonary Department

## 2021-07-16 NOTE — Consult Note (Signed)
ORTHOPAEDIC CONSULTATION  REQUESTING PHYSICIAN: [name], MD  Chief Complaint:   Sepsis of unknown origin  History of Present Illness: Becky Trevino is a 74 y.o. female with multiple medical problems including diabetes, hyperlipidemia, hypertension, chronic kidney disease, obesity, obstructive sleep apnea, peripheral vascular disease, and osteoarthritis of multiple joints.  The patient is status post a left total hip arthroplasty and bilateral total knee arthroplasties by Dr. Rudene Christians in the past.  More recently, he has been following her for advanced degenerative joint disease of the right hip, but does not feel that she is a good surgical candidate for a hip replacement.  The patient presented to the emergency room yesterday complaining of general malaise with decreased appetite, weakness, and nonproductive cough.  Her blood pressure was noted to be low and her heart rate elevated.  She also had an elevated lactic acid, all of which were concerning for sepsis.  Therefore, the patient was admitted for supportive care and IV antibiotics.  Because she "endorsed" right hip pain at the time of admission and was noted to have advanced degenerative joint disease of the right hip with a small effusion on her admission pelvic CT scan, I have been asked to evaluate her for possible right hip sepsis as the source of her infection.  The patient denies any recent change in her right hip symptoms, and notes that she is able to move her leg without severe pain.  Past Medical History:  Diagnosis Date   Cervical spondylosis    Chronic kidney disease    Stage 3   DDD (degenerative disc disease), cervical    [facility]   Diabetes mellitus without complication (HCC)    Diverticulosis    Hyperlipidemia    Hypertension    Intertrigo    Leg cramps    Leg varices    Obesity    OSA (obstructive sleep apnea)    Symptomatic menopausal or  female climacteric states    Syncope    Past Surgical History:  Procedure Laterality Date   ABDOMINAL HYSTERECTOMY  1993   Total   BUNIONECTOMY Bilateral 1993   I & D KNEE WITH POLY EXCHANGE Right 11/19/2017   Procedure: RIGHT KNEE POLY EXCHANGE WITH IRRIGATION AND DEBRIDEMENT;  Surgeon: Hessie Knows, MD;  Location: [facility];  Service: Orthopedics;  Laterality: Right;   JOINT REPLACEMENT     bilateral knee   LAMINECTOMY  11/14/2013    Cervical Fusion , [facility], Dr. Delilah Shan   LASER ABLATION Bilateral 07/29/2012   Dr. Lucky Cowboy   LOWER EXTREMITY ANGIOGRAPHY Right 11/02/2019   Procedure: Lower Extremity Angiography;  Surgeon: Katha Cabal, MD;  Location: [place] [facility];  Service: Cardiovascular;  Laterality: Right;   ORIF ANKLE FRACTURE Right 09/02/2019   Procedure: OPEN REDUCTION INTERNAL FIXATION (ORIF) ANKLE FRACTURE BIMALLEOLAR;  Surgeon: [name], DPM;  Location: [facility];  Service: Podiatry;  Laterality: Right;   PERIPHERAL VASCULAR THROMBECTOMY Right 03/02/2020   Procedure: PERIPHERAL VASCULAR THROMBECTOMY;  Surgeon: Algernon Huxley, MD;  Location: [place] [facility];  Service: Cardiovascular;  Laterality: Right;   REPLACEMENT TOTAL KNEE Left 03/2010   Dr. Rudene Christians   TOTAL HIP ARTHROPLASTY Left 12/11/2015   Procedure: TOTAL HIP ARTHROPLASTY ANTERIOR APPROACH;  Surgeon: Hessie Knows, MD;  Location: [facility];  Service: Orthopedics;  Laterality: Left;   TOTAL KNEE ARTHROPLASTY Right 03/11/2016   Procedure: TOTAL KNEE ARTHROPLASTY;  Surgeon: Hessie Knows, MD;  Location: [facility];  Service: Orthopedics;  Laterality: Right;   Social  History   Socioeconomic History   Marital status: Married    Spouse name: Not on file   Number of children: 1   Years of education: Not on file   Highest education level: Not on file  Occupational History   Not on file  Tobacco Use   Smoking status: Never   Smokeless tobacco: Never  Vaping Use   Vaping Use: Never used  Substance and Sexual Activity    Alcohol use: No    Alcohol/week: 0.0 standard drinks   Drug use: No   Sexual activity: Yes    Partners: Male  Other Topics Concern   Not on file  Social History Narrative   Married, one adopted grown child    Social Determinants of Radio broadcast assistant Strain: Low Risk    Difficulty of Paying Living Expenses: Not hard at all  Food Insecurity: No Food Insecurity   Worried About Charity fundraiser in the Last Year: Never true   Arboriculturist in the Last Year: Never true  Transportation Needs: No Transportation Needs   Lack of Transportation (Medical): No   Lack of Transportation (Non-Medical): No  Physical Activity: Inactive   Days of Exercise per Week: 0 days   Minutes of Exercise per Session: 30 min  Stress: No Stress Concern Present   Feeling of Stress : Not at all  Social Connections: Socially Integrated   Frequency of Communication with Friends and Family: More than three times a week   Frequency of Social Gatherings with Friends and Family: More than three times a week   Attends Religious Services: More than 4 times per year   Active Member of [employer] or Organizations: Yes   Attends Archivist Meetings: Never   Marital Status: Married   Family History  Problem Relation Age of Onset   Diabetes Mother    Hyperlipidemia Mother    Hypertension Mother    Diabetes Father    Hyperlipidemia Father    Hypertension Father    Obesity Father    Hypertension Sister    Hyperlipidemia Sister    Hyperlipidemia Sister    Breast cancer Neg Hx    Allergies  Allergen Reactions   Lisinopril Swelling   Prior to Admission medications   Medication Sig Start Date End Date Taking? Authorizing Provider  ASPIRIN LOW DOSE 81 MG EC tablet TAKE 1 TABLET BY MOUTH EVERY DAY 01/29/21  Yes Sowles, Drue Stager, MD  Calcium Carbonate-Vitamin D 600-400 MG-UNIT tablet Take 1 tablet by mouth 2 (two) times daily. 11/14/20  Yes Sowles, Drue Stager, MD  cephALEXin (KEFLEX) 500 MG capsule Take  500 mg by mouth 3 (three) times daily. 07/11/21  Yes [provider]  doxycycline (VIBRAMYCIN) 100 MG capsule Take 100 mg by mouth 2 (two) times daily. 12/14/20  Yes [provider]  levothyroxine (SYNTHROID) 25 MCG tablet TAKE 1 TABLET (25 MCG TOTAL) BY MOUTH DAILY AT 6 (SIX) AM. 05/14/21 07/16/21 Yes Sowles, Drue Stager, MD  Magnesium Oxide 500 MG CAPS Take 500 mg by mouth daily.  11/10/07  Yes [provider]  metoprolol succinate (TOPROL-XL) 25 MG 24 hr tablet TAKE 1 TABLET (25 MG TOTAL) BY MOUTH DAILY. 07/01/21  Yes Sowles, Drue Stager, MD  mirabegron ER (MYRBETRIQ) 50 MG TB24 tablet Take 1 tablet (50 mg total) by mouth daily. 06/28/21  Yes [name], Larene Beach A, PA-C  oxyCODONE (OXY IR/ROXICODONE) 5 MG immediate release tablet Take 5 mg by mouth every 4 (four) hours as needed. 02/18/21  Yes Hessie Knows, MD  potassium chloride (KLOR-CON) 10 MEQ tablet Take 10 mEq by mouth daily. 08/18/19  Yes Callwood, Dwayne D, MD  SIMBRINZA 1-0.2 % SUSP Place 1 drop into both eyes 2 (two) times daily. 10/13/19  Yes [provider]  acetaminophen (TYLENOL) 650 MG CR tablet Take 650-1,300 mg by mouth 2 (two) times daily as needed for pain.    [provider]  atorvastatin (LIPITOR) 40 MG tablet Take 1 tablet (40 mg total) by mouth every evening. 06/07/21   Ancil Boozer, Drue Stager, MD  fluticasone [facility]) 50 MCG/ACT nasal spray SPRAY 2 SPRAYS INTO EACH NOSTRIL EVERY DAY 05/14/21   Ancil Boozer, Drue Stager, MD  ketoconazole (NIZORAL) 2 % cream Apply 1 application topically 2 (two) times daily as needed (rash).  01/26/15   [provider]  latanoprost (XALATAN) 0.005 % ophthalmic solution SMARTSIG:In Eye(s) 07/12/20   [provider]  polyethylene glycol (MIRALAX / GLYCOLAX) 17 g packet Take 17 g by mouth daily. 09/15/20   Antonieta Pert, MD  torsemide (DEMADEX) 20 MG tablet Take 1 tablet (20 mg total) by mouth daily. 02/04/21 03/06/21  [name], MD  traZODone (DESYREL) 50 MG tablet Take  0.5-1 tablets (25-50 mg total) by mouth at bedtime as needed for sleep. 02/26/21   [name], MD   CT ABDOMEN PELVIS WO CONTRAST  Result Date: 07/16/2021 CLINICAL DATA:  Abdominal pain and fever. EXAM: CT ABDOMEN AND PELVIS WITHOUT CONTRAST TECHNIQUE: Multidetector CT imaging of the abdomen and pelvis was performed following the standard protocol without IV contrast. COMPARISON:  Pelvic CT dated 01/09/2020 and CT abdomen pelvis dated 11/01/2019. FINDINGS: Evaluation of this exam is limited in the absence of intravenous contrast. Lower chest: Bibasilar atelectasis/scarring. Coronary vascular calcification. No intra-abdominal free air or free fluid. Hepatobiliary: The liver is unremarkable. No intrahepatic biliary dilatation. Layering high attenuating content within the gallbladder may represent sludge or small stones. No pericholecystic fluid or evidence of acute cholecystitis by CT. Pancreas: Unremarkable. No pancreatic ductal dilatation or surrounding inflammatory changes. Spleen: Normal in size without focal abnormality. Adrenals/Urinary Tract: The adrenal glands are unremarkable. There is no hydronephrosis or nephrolithiasis on either side. A 1 cm right renal upper pole hypodense lesion is suboptimally characterized. The visualized ureters appear unremarkable. The urinary bladder is poorly visualized and obscured by streak artifact caused by left hip arthroplasty. Stomach/Bowel: Moderate stool throughout the colon. There is a 2 cm duodenal diverticulum without active inflammatory changes. There is no bowel obstruction or active inflammation. The appendix is normal. Vascular/Lymphatic: Moderate aortoiliac atherosclerotic disease. An infrarenal IVC filter noted. There is no adenopathy. Reproductive: The uterus is not visualized. Other: None Musculoskeletal: Degenerative changes of the spine and scoliosis. Severe right hip arthritic changes with protrusio acetabula and fragmentation of the acetabulum and  medial acetabular wall. Total left hip arthroplasty. No acute osseous pathology. Right hip effusion. IMPRESSION: 1. No acute intra-abdominal or pelvic pathology. No hydronephrosis or nephrolithiasis. 2. Moderate colonic stool burden. No bowel obstruction. Normal appendix. 3. Severe right hip arthritic changes with protrusion acetabula and fragmentation of the acetabulum and medial acetabular wall. Right hip effusion. 4. Aortic Atherosclerosis (ICD10-I70.0). Electronically Signed   By: Anner Crete M.D.   On: 07/16/2021 00:58   DG Chest 1 View  Result Date: 07/15/2021 CLINICAL DATA:  Pt reports she is only here to "get checked out to see if have COVID or flu". States she has been feeling weak for the past few days but feels better today. Reports decreased eating, drinking,  urine output. Last urine output this am. EXAM: CHEST  1 VIEW COMPARISON:  Chest x-ray 02/23/2021, CT chest 02/23/2021 FINDINGS: The heart and mediastinal contours are unchanged. Low lung volumes. No focal consolidation. No pulmonary edema. Trace pleural effusions not excluded. No pneumothorax. No acute osseous abnormality. Severe degenerative changes of the shoulders bilaterally. IMPRESSION: Low lung volumes.  Trace pleural effusions not excluded. Electronically Signed   By: Iven Finn M.D.   On: 07/15/2021 17:30    Positive ROS: All other systems have been reviewed and were otherwise negative with the exception of those mentioned in the HPI and as above.  Physical Exam: General:  Alert, no acute distress Psychiatric:  Patient is competent for consent with normal mood and affect   Cardiovascular:  No pedal edema Respiratory:  No wheezing, non-labored breathing GI:  Abdomen is soft and non-tender Skin:  No lesions in the area of chief complaint Neurologic:  Sensation intact distally Lymphatic:  No axillary or cervical lymphadenopathy  Orthopedic Exam:  Orthopedic examination is limited to the right hip and lower  extremity.  Skin inspection around the right hip is unremarkable.  No swelling, erythema, ecchymosis, abrasions or other skin abnormalities are identified.  Actively, she is able to flex and extend her hip through an arc of about 20 to 30 degrees while lying in bed with at most minimal discomfort.  She is able to actively dorsiflex and plantarflex her toes and ankle.  Sensation is present to light touch to all distributions.  She has fair capillary refill to her right foot.  X-rays:  A recent CT scan of the abdomen and pelvis is available for review and has been reviewed by myself.  These findings are as described above.  There his chronic advanced degenerative joint disease of the right hip with protrusio and some fragmentation of the medial wall of the acetabulum.  There also is a small right hip joint effusion.  Assessment: Chronic severe degenerative joint disease of right hip.  Plan: Based on the patient's history and examination, as well as the findings on CT scan, I do not feel that the right hip is the source of her infection at this time.  If further work-up does not reveal any other source, the next step would be to perform an aspiration of the right hip under either fluoroscopic or ultrasound guidance to obtain fluid for culture.  This would best be performed by Interventional Radiology.  Thank you for asked me to participate in the care of this most unfortunate woman.  I will follow from a distance.   Pascal Lux, MD  Beeper #:  [phone]  07/16/2021 7:40 PM

## 2021-07-17 ENCOUNTER — Other Ambulatory Visit: Payer: Self-pay

## 2021-07-17 ENCOUNTER — Inpatient Hospital Stay: Payer: Medicare Other

## 2021-07-17 DIAGNOSIS — E441 Mild protein-calorie malnutrition: Secondary | ICD-10-CM | POA: Diagnosis not present

## 2021-07-17 DIAGNOSIS — A419 Sepsis, unspecified organism: Secondary | ICD-10-CM | POA: Diagnosis not present

## 2021-07-17 DIAGNOSIS — N179 Acute kidney failure, unspecified: Secondary | ICD-10-CM | POA: Diagnosis not present

## 2021-07-17 DIAGNOSIS — R6521 Severe sepsis with septic shock: Secondary | ICD-10-CM | POA: Diagnosis not present

## 2021-07-17 DIAGNOSIS — Z515 Encounter for palliative care: Secondary | ICD-10-CM | POA: Diagnosis not present

## 2021-07-17 DIAGNOSIS — R531 Weakness: Secondary | ICD-10-CM | POA: Diagnosis not present

## 2021-07-17 DIAGNOSIS — Z7189 Other specified counseling: Secondary | ICD-10-CM

## 2021-07-17 LAB — SYNOVIAL CELL COUNT + DIFF, W/ CRYSTALS
Crystals, Fluid: NONE SEEN
Eosinophils-Synovial: 0 %
Lymphocytes-Synovial Fld: 54 %
Monocyte-Macrophage-Synovial Fluid: 13 %
Neutrophil, Synovial: 33 %
WBC, Synovial: 1382 /mm3 — ABNORMAL HIGH (ref 0–200)

## 2021-07-17 LAB — CBC WITH DIFFERENTIAL/PLATELET
Abs Immature Granulocytes: 0.06 10*3/uL (ref 0.00–0.07)
Basophils Absolute: 0 10*3/uL (ref 0.0–0.1)
Basophils Relative: 0 %
Eosinophils Absolute: 0.2 10*3/uL (ref 0.0–0.5)
Eosinophils Relative: 1 %
HCT: 38.3 % (ref 36.0–46.0)
Hemoglobin: 12.9 g/dL (ref 12.0–15.0)
Immature Granulocytes: 1 %
Lymphocytes Relative: 6 %
Lymphs Abs: 0.7 10*3/uL (ref 0.7–4.0)
MCH: 32.4 pg (ref 26.0–34.0)
MCHC: 33.7 g/dL (ref 30.0–36.0)
MCV: 96.2 fL (ref 80.0–100.0)
Monocytes Absolute: 0.3 10*3/uL (ref 0.1–1.0)
Monocytes Relative: 3 %
Neutro Abs: 10.1 10*3/uL — ABNORMAL HIGH (ref 1.7–7.7)
Neutrophils Relative %: 89 %
Platelets: 125 10*3/uL — ABNORMAL LOW (ref 150–400)
RBC: 3.98 MIL/uL (ref 3.87–5.11)
RDW: 16.3 % — ABNORMAL HIGH (ref 11.5–15.5)
WBC: 11.3 10*3/uL — ABNORMAL HIGH (ref 4.0–10.5)
nRBC: 0 % (ref 0.0–0.2)

## 2021-07-17 LAB — HEPATIC FUNCTION PANEL
ALT: 29 U/L (ref 0–44)
AST: 22 U/L (ref 15–41)
Albumin: 2.7 g/dL — ABNORMAL LOW (ref 3.5–5.0)
Alkaline Phosphatase: 100 U/L (ref 38–126)
Bilirubin, Direct: 0.3 mg/dL — ABNORMAL HIGH (ref 0.0–0.2)
Indirect Bilirubin: 0.8 mg/dL (ref 0.3–0.9)
Total Bilirubin: 1.1 mg/dL (ref 0.3–1.2)
Total Protein: 6.5 g/dL (ref 6.5–8.1)

## 2021-07-17 LAB — GLUCOSE, CAPILLARY
Glucose-Capillary: 114 mg/dL — ABNORMAL HIGH (ref 70–99)
Glucose-Capillary: 100 mg/dL — ABNORMAL HIGH (ref 70–99)
Glucose-Capillary: 107 mg/dL — ABNORMAL HIGH (ref 70–99)
Glucose-Capillary: 152 mg/dL — ABNORMAL HIGH (ref 70–99)
Glucose-Capillary: 102 mg/dL — ABNORMAL HIGH (ref 70–99)

## 2021-07-17 LAB — BASIC METABOLIC PANEL
Anion gap: 6 (ref 5–15)
BUN: 40 mg/dL — ABNORMAL HIGH (ref 8–23)
CO2: 21 mmol/L — ABNORMAL LOW (ref 22–32)
Calcium: 8.3 mg/dL — ABNORMAL LOW (ref 8.9–10.3)
Chloride: 109 mmol/L (ref 98–111)
Creatinine, Ser: 2.24 mg/dL — ABNORMAL HIGH (ref 0.44–1.00)
GFR, Estimated: 22 mL/min — ABNORMAL LOW (ref >60)
Glucose, Bld: 114 mg/dL — ABNORMAL HIGH (ref 70–99)
Potassium: 4.7 mmol/L (ref 3.5–5.1)
Sodium: 136 mmol/L (ref 135–145)

## 2021-07-17 LAB — PHOSPHORUS: Phosphorus: 3.1 mg/dL (ref 2.5–4.6)

## 2021-07-17 LAB — MAGNESIUM: Magnesium: 2.2 mg/dL (ref 1.7–2.4)

## 2021-07-17 LAB — CK: Total CK: 184 U/L (ref 38–234)

## 2021-07-17 MED ORDER — LIDOCAINE HCL (PF) 1 % IJ SOLN
10.0000 mL | Freq: Once | INTRAMUSCULAR | Status: AC
  Administered 2021-07-17: 5 mL

## 2021-07-17 MED ORDER — SODIUM CHLORIDE 0.9 % IV SOLN
2.0000 g | Freq: Two times a day (BID) | INTRAVENOUS | Status: AC
  Administered 2021-07-17 – 2021-07-18 (×4): 2 g via INTRAVENOUS
  Filled 2021-07-17 (×5): qty 2

## 2021-07-17 MED ORDER — ADENOSINE 12 MG/4ML IV SOLN
INTRAVENOUS | Status: AC
  Filled 2021-07-17: qty 4

## 2021-07-17 MED ORDER — METOPROLOL TARTRATE 5 MG/5ML IV SOLN
INTRAVENOUS | Status: AC
  Filled 2021-07-17: qty 5

## 2021-07-17 MED ORDER — ADENOSINE 12 MG/4ML IV SOLN
12.0000 mg | Freq: Once | INTRAVENOUS | Status: AC
  Administered 2021-07-17: 12 mg via INTRAVENOUS

## 2021-07-17 MED ORDER — IOHEXOL 300 MG/ML  SOLN
50.0000 mL | Freq: Once | INTRAMUSCULAR | Status: AC | PRN
  Administered 2021-07-17: 5 mL

## 2021-07-17 MED ORDER — AMIODARONE LOAD VIA INFUSION
150.0000 mg | Freq: Once | INTRAVENOUS | Status: DC
  Filled 2021-07-17: qty 83.34

## 2021-07-17 MED ORDER — METOPROLOL TARTRATE 5 MG/5ML IV SOLN
5.0000 mg | Freq: Once | INTRAVENOUS | Status: AC
  Administered 2021-07-17: 5 mg via INTRAVENOUS

## 2021-07-17 MED ORDER — ADENOSINE 12 MG/4ML IV SOLN
6.0000 mg | Freq: Once | INTRAVENOUS | Status: AC
Start: 2021-07-17 — End: 2021-07-17
  Administered 2021-07-17: 6 mg via INTRAVENOUS

## 2021-07-17 MED ORDER — SODIUM CHLORIDE 0.9 % IV SOLN
8.0000 mg/kg | Freq: Every day | INTRAVENOUS | Status: DC
  Administered 2021-07-17: 750 mg via INTRAVENOUS
  Filled 2021-07-17 (×3): qty 15

## 2021-07-17 MED ORDER — AMIODARONE HCL IN DEXTROSE 360-4.14 MG/200ML-% IV SOLN
60.0000 mg/h | INTRAVENOUS | Status: DC
  Filled 2021-07-17: qty 200

## 2021-07-17 MED ORDER — AMIODARONE HCL IN DEXTROSE 360-4.14 MG/200ML-% IV SOLN: 30.0000 mg/h | INTRAVENOUS | Status: DC

## 2021-07-17 NOTE — Consult Note (Signed)
PHARMACY CONSULT NOTE - FOLLOW UP  Pharmacy Consult for Electrolyte Monitoring and Replacement   Recent Labs: Potassium (mmol/L)  Date Value  07/17/2021 4.7  10/18/2012 3.5   Magnesium (mg/dL)  Date Value  07/17/2021 2.2   Calcium (mg/dL)  Date Value  07/17/2021 8.3 (L)   Calcium, Total (mg/dL)  Date Value  10/18/2012 7.9 (L)   Albumin (g/dL)  Date Value  07/17/2021 2.7 (L)  11/26/2015 4.3  05/22/2012 3.4   Phosphorus (mg/dL)  Date Value  07/17/2021 3.1   Sodium (mmol/L)  Date Value  07/17/2021 136  11/26/2015 142  10/18/2012 141     Assessment: 75yo female w/ h/o SVT, TCP, DVT, MRSA bacteremia, T2DM, CKD, HLD,  & HTN presenting w/ AKI on CKD, hypotension, and dec'd UOP. Broad spectrum abx were started with c/f sepsis but despite fluid resuscitation pt remain MAP <65 and started levophed with CCU transfer. Pharmacy consulted for the mgmt of electrolytes.  Scr: (BL 1.29; now 3.47>2.24) MIVF: none Nutrition: none  Goal of Therapy:  Lytes WNL  Plan:  AKI improving w/ LR fluid bolusing, Lytes WNL today not requiring repletion.  Will CTM and adjust PRN with AM labs.  Lorna Dibble ,PharmD,BCCP Clinical Pharmacist 07/17/2021 7:45 AM

## 2021-07-17 NOTE — Consult Note (Signed)
Palliative Care Consult Note                                  Date: 07/17/2021   Patient Name: Becky Trevino  DOB: 1945/12/16  MRN: [id number]  Age / Sex: 75 y.o., female  PCP: [name], MD Referring Physician: [name], MD  Reason for Consultation: Establishing goals of care  HPI/Patient Profile: 75 y.o. female  with past medical history of anasarca, SVT, AKI on CKD, thrombocytopenia, mild protein calorie malnutrition, DVT, MRSA bacteremia, chronic infection of right knee, CKD stage III, chronic renal insufficiency, diabetes, and others.  She presents to the emergency department with a chief complaint of hypotension and decreased urine output.  She was admitted on 07/16/2021 with septic shock of unknown etiology, possible right hip septic arthritis/joint infection, SVT, chronic anasarca/lymphedema with chronic diastolic CHF, severe sepsis, AKI on CKD stage III.  During her admission she went into SVT and received adenosine x2 and metoprolol.  Was on low-dose pressors (Neo-Synephrine).  She underwent IR guided right hip aspiration yesterday.  Pressors have since been weaned off.  Hip aspiration with high WBC count, cultures pending.  PMT was consulted for goals of care conversation.  Past Medical History:  Diagnosis Date  . Cervical spondylosis   . Chronic kidney disease    Stage 3  . DDD (degenerative disc disease), cervical    [facility]  . Diabetes mellitus without complication ([place])   . Diverticulosis   . Hyperlipidemia   . Hypertension   . Intertrigo   . Leg cramps   . Leg varices   . Obesity   . OSA (obstructive sleep apnea)   . Symptomatic menopausal or female climacteric states   . Syncope     Subjective:   This NP Walden Field reviewed medical records, received report from team, assessed the patient and then meet at the patient's bedside to discuss diagnosis, prognosis, GOC, EOL wishes disposition and  options.  I met with the patient and her husband Becky Trevino at the bedside.   Concept of Palliative Care was introduced as specialized medical care for people and their families living with serious illness.  If focuses on providing relief from the symptoms and stress of a serious illness.  The goal is to improve quality of life for both the patient and the family. Values and goals of care important to patient and family were attempted to be elicited.  Created space and opportunity for patient  and family to explore thoughts and feelings regarding current medical situation   Natural trajectory and current clinical status were discussed. Questions and concerns addressed. Patient  encouraged to call with questions or concerns.    Patient/Family Understanding of Illness: I understand she has significant infection, low blood pressure, high heart rate.  We had further discussion to fill in the gaps and explained the status of her chronic and acute conditions.  We at length her acute health problems together including infection/sepsis causing low blood pressure and subsequent high heart rate.  Discussed her baseline prior to acute illness.  She is not fully independent.  She previously had an ankle infection, developed "clots in the leg" (DVT?)  And had to have a filter placed.  Postoperatively her left leg is essentially unusable.  She now requires a walker and a Hoveround to get around the house.  She needs transportation to get to medical appointments, at times she  has to pay for this privately and it is quite expensive.  She is independent with her personal care such as toileting, dressing although occasionally requires minimal assistance.  Life Review: The patient worked at [employer] for 15 years where she states she "did it all" including stocking, Scientist, water quality, general maintenance of the store.  She stopped working in 2020 after her infection and disability.  She laments that she no longer gets benefits.  She  enjoys getting out of the house if possible and she would love to be able to walk a little bit outdoors.  She has 1 daughter and is currently married to her husband Becky Trevino.  She does enjoy gospel music as well.  Patient Values: She values family, and faith.  She notes that she prays daily for healing.  Goals: She shares that her short-term goals are to get better and get home.  Her long-term goal is to get up and be more mobile if she is able to.  Today's Discussion: Today we discussed her chronic and acute health situations, treatments being provided, we discussed goals.  We discussed realistic expectations related to her goals.  We shared that we always hope and pray for the past, but it is prudent plan for things do not go the way we want him to.  I shared that she still has a significant infection, although seems to be improving.  We reminisce on stories of her life.  Participated in a very interactive conversation.  Related to goals of care we discussed attendance of DNR, what a DNR means and the length of her acute and chronic health conditions, and that DNR does not affect the level of treatment and care that she receives.  I provided reading material via "difficult choices for loving people" [place].  I also provided a copy of the MOST form.  They declined to make decisions today but indicated they want written materials and discussed months that he stops and family.  I provided emotional and general support through therapeutic listening and other techniques.  I answered all questions and addressed all concerns.  Review of Systems  Constitutional:  Positive for activity change.  Respiratory:  Negative for shortness of breath.   Cardiovascular:  Negative for chest pain.  Gastrointestinal:  Negative for nausea and vomiting.   Objective:   Primary Diagnoses: Present on Admission: . Sepsis [facility])   Physical Exam Vitals and nursing note reviewed.  Constitutional:      General: She is  not in acute distress.    Appearance: She is obese. She is ill-appearing.  HENT:     Head: Normocephalic and atraumatic.  Cardiovascular:     Rate and Rhythm: Normal rate.  Pulmonary:     Effort: Pulmonary effort is normal. No respiratory distress.     Breath sounds: Normal breath sounds. No wheezing or rhonchi.  Abdominal:     General: Abdomen is flat.     Palpations: Abdomen is soft.  Skin:    General: Skin is warm and dry.  Neurological:     General: No focal deficit present.     Mental Status: She is alert.  Psychiatric:        Mood and Affect: Mood normal.        Behavior: Behavior normal.    Vital Signs:  BP (!) 114/55   Pulse 74   Temp 97.9 F (36.6 C) (Oral)   Resp (!) 22   Ht 5' 11" (1.803 m)   Wt 126.5 kg  SpO2 99%   BMI 38.90 kg/m   Palliative Assessment/Data: 50%    Advanced Care Planning:   Primary Decision Maker: PATIENT  Code Status/Advance Care Planning: Full code  A discussion was had today regarding advanced directives. Concepts specific to code status, artifical feeding and hydration, continued IV antibiotics and rehospitalization was had.  The difference between a aggressive medical intervention path and a palliative comfort care path for this patient at this time was had. The MOST form was introduced and discussed. A copy was left for further review and discussion.  Decisions/Changes to ACP: No changes today  Assessment & Plan:   Impression: Acutely ill 75-year-old female with multiple chronic comorbidities as described above.  In general her acute health issues seem to be treatable and she seems to be making progress related to use.  Still unidentified source of her sepsis, hip cultures pending.  If her sepsis and infection can improve then likely her blood pressure and heart rate will subsequently improve as well.  She is hopeful to be able to get home and get well.  She is wanting to increase her activity to help with her chronic health  issues.  SUMMARY OF RECOMMENDATIONS   Continue full scope of treatment Remain full code Further discussion on goals of care moving forward PMT will continue to follow  Symptom Management:  Per primary team/PCCM PMT is available to assist as needed  Prognosis:  Unable to determine  Discharge Planning:  To Be Determined   Discussed with:     Thank you for allowing us to participate in the care of Becky Trevino PMT will continue to support holistically.  Time Total: 70 min  Greater than 50%  of this time was spent counseling and coordinating care related to the above assessment and plan.  Signed by: Eric Gill, NP Palliative Medicine Team  Team Phone # [phone] (Nights/Weekends)  07/17/2021, 3:01 PM                

## 2021-07-17 NOTE — Progress Notes (Signed)
0600 Pt sustaining SVT in 140s, highest HR witnessed was 155.  Rufina Falco, NP on call notified and at bedside.  0615:   6 mg adenosine 0630:   12 mg adenosine 0645:   5 mg metoprolol  Pt seems to have converted to Afib per 12 lead EKG.  NP at bedside.  Orders received.

## 2021-07-17 NOTE — Progress Notes (Signed)
Chaplain Maggie visited at bedside with pt, daughter and spouse for Advanced Directive education. Social support and prayer were also part of this visit. Contact pastoral care to coordinate notary and witnesses when pt is ready for signatures.

## 2021-07-17 NOTE — Progress Notes (Signed)
NAME:  Becky Trevino, MRN:  [id number], DOB:  December 01, 1945, LOS: 1 ADMISSION DATE:  07/16/2021, CONSULTATION DATE:  07/16/2021 REFERRING MD:  Dr. Starleen Blue, CHIEF COMPLAINT:  Hypotension   Brief Pt Description / Synopsis:  75 y.o. Female admitted with Septic shock of unknown etiology, suspect possible right hip septic arthritis/joint infection.  History of Present Illness:  75 y.o with significant PMH as below who presented to the ED with chief complaints of hypotension and decreased urine output   Patient report onset of symptoms since several days ago associated with decreased appetite, generalized weakness and nonproductive cough.  Denies abdominal pain, nausea vomiting, diarrhea, fevers or chills.  Patient endorses right hip pain and difficulty walking or weightbearing.   ED Course: On arrival to the ED,  she was afebrile with blood pressure  71/41 mm Hg and pulse rate 133 beats/min, respirations 14 breaths/min and oxygen saturation 92% on room air there were no focal neurological deficits;  Pertinent Labs in Red/Diagnostics Findings: Na+/ K+: 137/4.5 Glucose: 106 BUN/Cr.: 32/3.11 Calcium: 9.0 AST/ALT:   WBC/ TMAX: 13.4/ afebrile Hgb/Hct: 12.9/41.1 Plts: 157 PCT: negative <0.10 Lactic acid: 2.3 COVID PCR: Negative   Troponin: 15 BNP: 78.7 Arterial Blood Gas result:  pO2 70; pCO2 35; pH 7.34;  HCO3 18.9, %O2 Sat 92.7.   EKG: normal sinus rhythm, unchanged from previous tracings. Imaging : CXR:Low lung volumes. Trace pleural effusions not excluded. CT Abd/pel : Severe right hip arthritic changes with protrusion acetabula and fragmentation of the acetabulum and medial acetabular wall. Right hip effusion.   Due to concerns for sepsis, code sepsis was initiated and patient started on broad-spectrum antibiotics following her IV fluid resuscitation.  Patient remained hypotensive despite IV fluid resuscitation therefore was started on Levophed to keep MAP >65 mm Hg.  PCCM  consulted  Pertinent  Medical History   Atherosclerosis of aorta ([place]) 06/07/2021  Anasarca    Hypothyroidism    SVT (supraventricular tachycardia) (HCC)    Acute kidney injury superimposed on CKD (HCC)    Hypotension    Class 2 obesity without serious comorbidity with body mass index (BMI) of 39.0 to 39.9 in adult    Thrombocytopenia (HCC)    Mild protein-calorie malnutrition (HCC)    Hyperbilirubinemia    Injury of left femoral nerve 08/28/2020  History of DVT (deep vein thrombosis) 06/25/2020  Lymphedema 06/25/2020  History of pelvic hematoma 02/10/2020  Foot ulcer, right, limited to breakdown of skin ([place]) 12/02/2019  MRSA bacteremia 10/30/2019  Chronic venous hypertension due to deep vein thrombosis (DVT) 10/30/2019  Hematoma    Normocytic anemia 08/07/2019  Chronic infection of right knee ([place]) 11/25/2018  Infection and inflammatory reaction due to internal joint prosthesis, subsequent encounter 01/25/2018  Infection of total right knee replacement ([place]) 11/19/2017  Type 2 diabetes mellitus with hyperlipidemia ([place]) 11/06/2016  Primary osteoarthritis of knee 03/11/2016  Primary osteoarthritis of left hip 12/11/2015  Benign essential HTN 04/21/2015  Edema leg 04/21/2015  Chronic kidney disease (CKD), stage III (moderate) ([place]) 04/21/2015  Chronic venous insufficiency 04/21/2015  DDD (degenerative disc disease), cervical 04/21/2015  Diabetes mellitus with renal manifestation ([place]) 04/21/2015  Hyperlipidemia 04/21/2015  Bladder cystocele 04/21/2015  Urinary incontinence 04/21/2015  Leg varices 04/21/2015  Insomnia 04/21/2015  Eczema intertrigo 04/21/2015  Obesity, Class III, BMI 40-49.9 (morbid obesity) ([place]) 04/21/2015  OSA (obstructive sleep apnea) 04/21/2015  Menopausal and perimenopausal disorder 04/21/2015  Supraventricular premature beats 04/21/2015  Osteoarthritis of multiple joints 04/21/2015  H/O Spinal surgery 11/14/2013  Detrusor muscle  hypertonia  11/07/2013    Micro Data:  10/31: SARS-CoV-2 PCR> negative 10/31: Influenza PCR> negative 10/31: Blood culture x2> 10/31: Urine Culture> 10/31: MRSA PCR>>   Antimicrobials:  Vancomycin 10/31> Cefepime 10/31>  Consults:  PCCM ID Orthopedics ENT Vascular Surgery Cardiology   Procedures:  None   Significant Diagnostic Tests:  10/31: Chest Xray> no acute cardiopulmonary process 11/1: CTA abdomen and pelvis >Severe right hip arthritic changes with protrusion acetabula and fragmentation of the acetabulum and medial acetabular wall. Right hip effusion.  Significant Hospital Events: Including procedures, antibiotic start and stop dates in addition to other pertinent events   11/1: Admitted to ICU with sepsis with septic shock of unknown source. ID & Ortho consulted 11/2: Episode of SVT, received Adenosine x2 and Metoprolol ~ plan to start Amiodarone per Cardiology recommendations. Remains on low dose pressors (30 mcg Neo), plan for IR guided right hip aspiration  Interim History / Subjective:  -Overnight with episode of SVT ~ given 6 mg Adenosine followed by 12 mg, followed by 5 mg Metoprolol ~ will start Amiodarone per previous Cardiology recommendations for recurrent episodes of SVT -Afebrile, requiring 30 mcg Neosynephrine, on room air -Leukocytosis slowly improved to 11.3 K (13.5 K yesterday) -Creatinine improved to 2.24 (3.47); UOP 400 cc last 24 hrs (+3.3 L since admit) -Pt is awake and alert this morning, currently denies all complaints -Ortho evaluated yesterday evening ~ does not feel right hip is source of infection, but recommends IR to perform aspiration of right hip if workup does not reveal any other source of infection -Will plan for IR to perform aspiration of right hip to obtain fluid for culture  Objective   Blood pressure 107/70, pulse 95, temperature 98.1 F (36.7 C), temperature source Oral, resp. rate 17, height 5\' 11"  (1.803 m), weight 126.5 kg, SpO2 93  %.        Intake/Output Summary (Last 24 hours) at 07/17/2021 0717 Last data filed at 07/17/2021 0700 Gross per 24 hour  Intake 1251.94 ml  Output 400 ml  Net 851.94 ml   Filed Weights   07/15/21 1049 07/16/21 0807 07/17/21 0500  Weight: (!) 149.7 kg 122 kg 126.5 kg    Examination: General: Acutely ill appearing female, laying in bed, on RA, in NAD HENT: Atraumatic, normocephalic, neck supple, no JVD Lungs: Clear to auscultation bilaterally, even, nonlabored, no accessory muscle use Cardiovascular: Tachycardia, irregular rhythm, no M/R/G Abdomen: Obese, soft, nontender, nondistended, no guarding or rebound tenderness Extremities: Bilateral LE with chronic venous stasis changes.  Limited ROM in right hip Neuro: Awake and alert, oriented to person, place, situation.  Follows commands, no focal deficits, speech clear, pupils PERRL GU: Deferred  Resolved Hospital Problem list     Assessment & Plan:   Septic Shock Tachyarrhythmias: SVT Chronic Anasarca/Lymphedema with Chronic Diastolic CHF Hx: HTN, CAD status post CABG in March 2021, SVT, HLD  -Continuous cardiac monitoring -Maintain MAP >65 -Vasopressors as needed to maintain MAP goal -Continue Midodrine -Lactic acid normalized -HS Troponin peaked at 21 -Consider repeat Echocardiogram ~ Echo from 09/07/20 with LVEF 74-16%, normal diastolic parameters, RV function normal -Cardiology following, appreciate input -Diuresis as BP and renal function permits -Currently holding home Torsemide & Metoprolol due to need for Vasopressors -Received Adenosine x2 & Metoprolol for SVT on 11/2 ~ will plan for Amiodarone IV as per previous Cardiology recommendations  Severe Sepsis due to suspected septic arthritis of right hip? PMHx: Group B streptococcus bacteremia, Left THA, Right TKA with prosthetic joint infection  Lactic: 2.3,  Urine culture negative, CXR: No acute cardiopulmonary process, CT: shows severe right hip effusion but no  other acute abdominal process  -Monitor fever curve -Trend WBC's & Procalcitonin -Follow cultures as above -ID following, appreciate input ~ will defer ABX to ID -Continue empiric Cefepime & Vancomycin pending cultures & sensitivities -Ortho evaluated pt, does not feel that the right hip is the source of infection ~ however if no other source found, recommends aspiration of the right hip under fluoroscopic vs Ultrasound guidance by IR to obtain fluid for culture    AKI on CKD Stage III -Monitor I&O's / urinary output -Follow BMP -Ensure adequate renal perfusion -Avoid nephrotoxic agents as able -Replace electrolytes as indicated  Mild Thrombocytopenia, suspect in setting of sepsis -Monitor for S/Sx of bleeding -Trend CBC -Heparin SQ for VTE Prophylaxis  -Transfuse for Hgb <7   Diabetes mellitus -CBG's q4h; Target range of 140 to 180 -SSI -Follow ICU Hypo/Hyperglycemia protocol -Hold home Meds   Hypothyroidism -Continue Synthroid   OSA not on CPAP -Supplemental O2 as needed to maintain O2 sats >90% -Follow intermittent CXR & ABG  -Ensure pulmonary hygiene  Best Practice (right click and "Reselect all SmartList Selections" daily)   Diet/type: NPO DVT prophylaxis: prophylactic heparin  GI prophylaxis: N/A Lines: N/A Foley:  N/A Code Status:  full code Last date of multidisciplinary goals of care discussion [07/17/2021]  Labs   CBC: Recent Labs  Lab 07/15/21 1051 07/16/21 0633 07/17/21 0420  WBC 13.4* 13.5* 11.3*  NEUTROABS  --   --  10.1*  HGB 12.9 13.4 12.9  HCT 41.1 40.4 38.3  MCV 97.4 96.9 96.2  PLT 157 150 125*    Basic Metabolic Panel: Recent Labs  Lab 07/15/21 1051 07/16/21 0633 07/17/21 0420  NA 137  --  136  K 4.5  --  4.7  CL 105  --  109  CO2 22  --  21*  GLUCOSE 106*  --  114*  BUN 32*  --  40*  CREATININE 3.11* 3.47* 2.24*  CALCIUM 9.0  --  8.3*  MG  --  2.3 2.2  PHOS  --  3.3 3.1   GFR: Estimated Creatinine Clearance: 31.9 mL/min  (A) (by C-G formula based on SCr of 2.24 mg/dL (H)). Recent Labs  Lab 07/15/21 1051 07/15/21 1808 07/15/21 2019 07/16/21 0113 07/16/21 0633 07/17/21 0420  PROCALCITON  --   --   --   --  1.28  --   WBC 13.4*  --   --   --  13.5* 11.3*  LATICACIDVEN  --  2.3* 2.2* 3.3* 2.1*  --     Liver Function Tests: Recent Labs  Lab 07/15/21 1808 07/17/21 0420  AST 46* 22  ALT 41 29  ALKPHOS 118 100  BILITOT 2.0* 1.1  PROT 7.1 6.5  ALBUMIN 3.3* 2.7*   No results for input(s): LIPASE, AMYLASE in the last 168 hours. No results for input(s): AMMONIA in the last 168 hours.  ABG    Component Value Date/Time   PHART 7.34 (L) 07/16/2021 0421   PCO2ART 35 07/16/2021 0421   PO2ART 70 (L) 07/16/2021 0421   HCO3 18.9 (L) 07/16/2021 0421   TCO2 25 09/02/2019 0813   ACIDBASEDEF 6.1 (H) 07/16/2021 0421   O2SAT 92.7 07/16/2021 0421     Coagulation Profile: Recent Labs  Lab 07/15/21 1808  INR 1.2    Cardiac Enzymes: No results for input(s): CKTOTAL, CKMB, CKMBINDEX, TROPONINI in the last 168 hours.  HbA1C: Hemoglobin A1C  Date/Time Value Ref Range Status  06/07/2021 10:12 AM 5.5 4.0 - 5.6 % Corrected  02/04/2021 10:52 AM 5.3 4.0 - 5.6 % Final   HbA1c, POC (controlled diabetic range)  Date/Time Value Ref Range Status  05/12/2019 10:33 AM 6.0 0.0 - 7.0 % Final  07/27/2018 08:43 AM 5.7 0.0 - 7.0 % Final   Hgb A1c MFr Bld  Date/Time Value Ref Range Status  10/28/2019 06:23 AM 5.5 4.8 - 5.6 % Final    Comment:    (NOTE) Pre diabetes:          5.7%-6.4% Diabetes:              >6.4% Glycemic control for   <7.0% adults with diabetes     CBG: Recent Labs  Lab 07/16/21 0802 07/16/21 1130 07/16/21 1618 07/16/21 1943 07/17/21 0248  GLUCAP 90 91 91 100* 114*    Review of Systems:   Positives in BOLD: Pt denies all complaints Gen: Denies fever, chills, weight change, fatigue, night sweats HEENT: Denies blurred vision, double vision, hearing loss, tinnitus, sinus  congestion, rhinorrhea, sore throat, neck stiffness, dysphagia PULM: Denies shortness of breath, cough, sputum production, hemoptysis, wheezing CV: Denies chest pain, edema, orthopnea, paroxysmal nocturnal dyspnea, palpitations GI: Denies abdominal pain, nausea, vomiting, diarrhea, hematochezia, melena, constipation, change in bowel habits GU: Denies dysuria, hematuria, polyuria, oliguria, urethral discharge Endocrine: Denies hot or cold intolerance, polyuria, polyphagia or appetite change Derm: Denies rash, dry skin, scaling or peeling skin change Heme: Denies easy bruising, bleeding, bleeding gums Neuro: Denies headache, numbness, weakness, slurred speech, loss of memory or consciousness   Past Medical History:  She,  has a past medical history of Cervical spondylosis, Chronic kidney disease, DDD (degenerative disc disease), cervical, Diabetes mellitus without complication ([place]), Diverticulosis, Hyperlipidemia, Hypertension, Intertrigo, Leg cramps, Leg varices, Obesity, OSA (obstructive sleep apnea), Symptomatic menopausal or female climacteric states, and Syncope.   Surgical History:   Past Surgical History:  Procedure Laterality Date   ABDOMINAL HYSTERECTOMY  1993   Total   BUNIONECTOMY Bilateral 1993   I & D KNEE WITH POLY EXCHANGE Right 11/19/2017   Procedure: RIGHT KNEE POLY EXCHANGE WITH IRRIGATION AND DEBRIDEMENT;  Surgeon: Hessie Knows, MD;  Location: [facility];  Service: Orthopedics;  Laterality: Right;   JOINT REPLACEMENT     bilateral knee   LAMINECTOMY  11/14/2013    Cervical Fusion , [facility], Dr. Delilah Shan   LASER ABLATION Bilateral 07/29/2012   Dr. Lucky Cowboy   LOWER EXTREMITY ANGIOGRAPHY Right 11/02/2019   Procedure: Lower Extremity Angiography;  Surgeon: Katha Cabal, MD;  Location: [place] [facility];  Service: Cardiovascular;  Laterality: Right;   ORIF ANKLE FRACTURE Right 09/02/2019   Procedure: OPEN REDUCTION INTERNAL FIXATION (ORIF) ANKLE FRACTURE BIMALLEOLAR;  Surgeon:  [name], DPM;  Location: [facility];  Service: Podiatry;  Laterality: Right;   PERIPHERAL VASCULAR THROMBECTOMY Right 03/02/2020   Procedure: PERIPHERAL VASCULAR THROMBECTOMY;  Surgeon: Algernon Huxley, MD;  Location: [place] [facility];  Service: Cardiovascular;  Laterality: Right;   REPLACEMENT TOTAL KNEE Left 03/2010   Dr. Rudene Christians   TOTAL HIP ARTHROPLASTY Left 12/11/2015   Procedure: TOTAL HIP ARTHROPLASTY ANTERIOR APPROACH;  Surgeon: Hessie Knows, MD;  Location: [facility];  Service: Orthopedics;  Laterality: Left;   TOTAL KNEE ARTHROPLASTY Right 03/11/2016   Procedure: TOTAL KNEE ARTHROPLASTY;  Surgeon: Hessie Knows, MD;  Location: [facility];  Service: Orthopedics;  Laterality: Right;     Social History:   reports  that she has never smoked. She has never used smokeless tobacco. She reports that she does not drink alcohol and does not use drugs.   Family History:  Her family history includes Diabetes in her father and mother; Hyperlipidemia in her father, mother, sister, and sister; Hypertension in her father, mother, and sister; Obesity in her father. There is no history of Breast cancer.   Allergies Allergies  Allergen Reactions   Lisinopril Swelling     Home Medications  Prior to Admission medications   Medication Sig Start Date End Date Taking? Authorizing Provider  ASPIRIN LOW DOSE 81 MG EC tablet TAKE 1 TABLET BY MOUTH EVERY DAY 01/29/21  Yes Sowles, Drue Stager, MD  Calcium Carbonate-Vitamin D 600-400 MG-UNIT tablet Take 1 tablet by mouth 2 (two) times daily. 11/14/20  Yes Sowles, Drue Stager, MD  cephALEXin (KEFLEX) 500 MG capsule Take 500 mg by mouth 3 (three) times daily. 07/11/21  Yes [provider]  doxycycline (VIBRAMYCIN) 100 MG capsule Take 100 mg by mouth 2 (two) times daily. 12/14/20  Yes [provider]  levothyroxine (SYNTHROID) 25 MCG tablet TAKE 1 TABLET (25 MCG TOTAL) BY MOUTH DAILY AT 6 (SIX) AM. 05/14/21 07/16/21 Yes Sowles, Drue Stager, MD  Magnesium Oxide 500 MG  CAPS Take 500 mg by mouth daily.  11/10/07  Yes [provider]  metoprolol succinate (TOPROL-XL) 25 MG 24 hr tablet TAKE 1 TABLET (25 MG TOTAL) BY MOUTH DAILY. 07/01/21  Yes Sowles, Drue Stager, MD  mirabegron ER (MYRBETRIQ) 50 MG TB24 tablet Take 1 tablet (50 mg total) by mouth daily. 06/28/21  Yes [name], Larene Beach A, PA-C  oxyCODONE (OXY IR/ROXICODONE) 5 MG immediate release tablet Take 5 mg by mouth every 4 (four) hours as needed. 02/18/21  Yes Hessie Knows, MD  potassium chloride (KLOR-CON) 10 MEQ tablet Take 10 mEq by mouth daily. 08/18/19  Yes Callwood, Dwayne D, MD  SIMBRINZA 1-0.2 % SUSP Place 1 drop into both eyes 2 (two) times daily. 10/13/19  Yes [provider]  acetaminophen (TYLENOL) 650 MG CR tablet Take 650-1,300 mg by mouth 2 (two) times daily as needed for pain.    [provider]  atorvastatin (LIPITOR) 40 MG tablet Take 1 tablet (40 mg total) by mouth every evening. 06/07/21   Ancil Boozer, Drue Stager, MD  fluticasone [facility]) 50 MCG/ACT nasal spray SPRAY 2 SPRAYS INTO EACH NOSTRIL EVERY DAY 05/14/21   Ancil Boozer, Drue Stager, MD  ketoconazole (NIZORAL) 2 % cream Apply 1 application topically 2 (two) times daily as needed (rash).  01/26/15   [provider]  latanoprost (XALATAN) 0.005 % ophthalmic solution SMARTSIG:In Eye(s) 07/12/20   [provider]  polyethylene glycol (MIRALAX / GLYCOLAX) 17 g packet Take 17 g by mouth daily. 09/15/20   Antonieta Pert, MD  torsemide (DEMADEX) 20 MG tablet Take 1 tablet (20 mg total) by mouth daily. 02/04/21 03/06/21  [name], MD  traZODone (DESYREL) 50 MG tablet Take 0.5-1 tablets (25-50 mg total) by mouth at bedtime as needed for sleep. 02/26/21   [name], MD     Critical care time: 40 minutes     Darel Hong, AGACNP-BC [facility] Prefer epic messenger for cross cover needs If after hours, please call E-link

## 2021-07-17 NOTE — Progress Notes (Signed)
Pharmacy Antibiotic Note  Becky Trevino is a 75 y.o. female admitted on 07/16/2021 with sepsis. History of 09/02/20-09/14/20 for left upper thigh cellulitis, MRSA bacteremia and GBS bacteremia and LLL infiltrate due to MRSA treated with IV vanco followed by Daptomycin for a total of 22 days. Feb 2021 GBS bacteremia 2/2 nonhealing wound (GBS+acinetobacter) treated with IV Unasy x4wks ending 11/25/2019.  Pharmacy has been consulted for Cefepime and Vancomycin dosing.  Plan: No clear source of infection identified, ID consulted and rec'd d/c of Vanc and favoring Dapto (will discuss further today). AKI improving w/ LR fluid bolusing [Scr: (BL 1.29; now 3.47>2.24)]. Cefepime adj'd accordingly.  Cefepime Adjusted Cefepime 2 gm q24h >>2g q12h per indication and renal fxn.  Vancomycin Pt recieved Vancomycin 2 gm loading dose (11/01 0430) ID consulted; recommendation to discontinue Vancomycin 11/01  Pharmacy will continue to follow and adjust/order Abx when warranted.  Height: 5\' 11"  (180.3 cm) Weight: 126.5 kg (278 lb 14.1 oz) IBW/kg (Calculated) : 70.8  Temp (24hrs), Avg:98.5 F (36.9 C), Min:98.1 F (36.7 C), Max:99 F (37.2 C)  Recent Labs  Lab 07/15/21 1051 07/15/21 1808 07/15/21 2019 07/16/21 0113 07/16/21 0633 07/17/21 0420  WBC 13.4*  --   --   --  13.5* 11.3*  CREATININE 3.11*  --   --   --  3.47* 2.24*  LATICACIDVEN  --  2.3* 2.2* 3.3* 2.1*  --      Estimated Creatinine Clearance: 31.9 mL/min (A) (by C-G formula based on SCr of 2.24 mg/dL (H)).    Allergies  Allergen Reactions   Lisinopril Swelling    Antimicrobials this admission: Cefepime (11/01 >>  Vancomycin (11/01-11/02) 11/01 Flagyl x 1 dose  Microbiology results: 10/31 BCx: NGTD 11/01 UCx: NGTD  11/01 MRSA PCR: positive (has h/o MRSA)  Thank you for allowing pharmacy to be a part of this patient's care.  Lorna Dibble, PharmD, [facility] Clinical Pharmacist 07/17/2021 7:52 AM

## 2021-07-17 NOTE — Progress Notes (Signed)
Updated pt's husband at bedside.  Informed him for plan for aspiration of Right hip by IR.  All questions answered.     Darel Hong, AGACNP-BC [place] [facility] Prefer epic messenger for cross cover needs If after hours, please call E-link

## 2021-07-17 NOTE — Progress Notes (Addendum)
Pharmacy Antibiotic Note  Becky Trevino is a 75 y.o. female admitted on 07/16/2021 with sepsis. History of 09/02/20-09/14/20 for left upper thigh cellulitis, MRSA bacteremia and GBS bacteremia and LLL infiltrate due to MRSA treated with IV vanco followed by Daptomycin for a total of 22 days. Feb 2021 GBS bacteremia 2/2 nonhealing wound (GBS+acinetobacter) treated with IV Unasy x4wks ending 11/25/2019.  Pharmacy has been consulted for Cefepime and Daptomycin dosing.   No clear source of infection identified, ID consulted and rec'd d/c of Vanc and favoring Dapto (will discuss further today). AKI improving w/ LR fluid bolusing [Scr: (BL 1.29; now 3.47>2.24)]. Cefepime adj'd accordingly.  Plan Daptomycin Start daptomycin 750 mg every 24 hours. Baseline and weekly CK level.  Cefepime Continue cefepime 2g q12h per indication and renal fxn.  Pharmacy will continue to follow and adjust/order Abx when warranted.  Height: 5\' 11"  (180.3 cm) Weight: 126.5 kg (278 lb 14.1 oz) IBW/kg (Calculated) : 70.8  Temp (24hrs), Avg:98.2 F (36.8 C), Min:97.9 F (36.6 C), Max:98.6 F (37 C)  Recent Labs  Lab 07/15/21 1051 07/15/21 1808 07/15/21 2019 07/16/21 0113 07/16/21 0633 07/17/21 0420  WBC 13.4*  --   --   --  13.5* 11.3*  CREATININE 3.11*  --   --   --  3.47* 2.24*  LATICACIDVEN  --  2.3* 2.2* 3.3* 2.1*  --      Estimated Creatinine Clearance: 31.9 mL/min (A) (by C-G formula based on SCr of 2.24 mg/dL (H)).    Allergies  Allergen Reactions   Lisinopril Swelling    Antimicrobials this admission: Cefepime (11/01 >>  Vancomycin (11/01-11/02) 11/01 Flagyl x 1 dose Daptomycin 11/2 >>  Microbiology results: 10/31 BCx: NGTD 11/01 UCx: NGTD  11/01 MRSA PCR: positive (has h/o MRSA)  Thank you for allowing pharmacy to be a part of this patient's care.  Lorna Dibble, PharmD, [facility] Clinical Pharmacist 07/17/2021 8:07 PM

## 2021-07-17 NOTE — Procedures (Signed)
PROCEDURE SUMMARY:  Successful fluoroscopic guided right hip joint aspiration.  Yielded 7 mL's  of serosanguinous fluid.  No immediate complications.  Pt tolerated well.   Specimen was sent for labs.  EBL < 5mL  Rockney Ghee 07/17/2021 4:08 PM

## 2021-07-17 NOTE — Progress Notes (Signed)
Date of Admission:  07/16/2021    ID: Becky Trevino is a 75 y.o. female Active Problems:   Sepsis ([place])    Subjective: Pt is feeling better today More energy Had rt hip joint aspiration- 7 ml fluid taken and sent for tests Husband at bed side and he says that it was the epistaxis that made her weak  Medications:   amiodarone  150 mg Intravenous Once   aspirin EC  81 mg Oral Daily   atorvastatin  40 mg Oral QPM   Chlorhexidine Gluconate Cloth  6 each Topical Daily   fluticasone  1 spray Each Nare Daily   heparin  5,000 Units Subcutaneous Q8H   hydrocortisone sod succinate (SOLU-CORTEF) inj  100 mg Intravenous Q8H   insulin aspart  0-9 Units Subcutaneous Q4H   levothyroxine  25 mcg Oral Q0600   midodrine  10 mg Oral TID WC   mupirocin ointment   Nasal BID   sodium chloride  1 spray Each Nare TID    Objective: Vital signs in last 24 hours: Temp:  [97.9 F (36.6 C)-98.6 F (37 C)] 97.9 F (36.6 C) (11/02 0700) Pulse Rate:  [66-162] 83 (11/02 1300) Resp:  [12-26] 23 (11/02 1300) BP: (77-143)/(47-108) 106/54 (11/02 1300) SpO2:  [93 %-100 %] 100 % (11/02 1300) Weight:  [126.5 kg] 126.5 kg (11/02 0500)  PHYSICAL EXAM:  General: Alert, cooperative, no distress, appears stated age.  Head: Normocephalic, without obvious abnormality, atraumatic. Eyes: Conjunctivae clear, anicteric sclerae. Pupils are equal ENT Nares normal. No drainage or sinus tenderness. Lips, mucosa, and tongue normal. No Thrush Neck: Supple, symmetrical, no adenopathy, thyroid: non tender no carotid bruit and no JVD. Back: No CVA tenderness. Lungs: Clear to auscultation bilaterally. No Wheezing or Rhonchi. No rales. Heart: Regular rate and rhythm, no murmur, rub or gallop. Abdomen: Soft, non-tender,not distended. Bowel sounds normal. No masses Extremities: edema legs B/l knee surgical scar Skin: No rashes or lesions. Or bruising Lymph: Cervical, supraclavicular normal. Neurologic: Grossly  non-focal  Lab Results Recent Labs    07/15/21 1051 07/16/21 0633 07/17/21 0420  WBC 13.4* 13.5* 11.3*  HGB 12.9 13.4 12.9  HCT 41.1 40.4 38.3  NA 137  --  136  K 4.5  --  4.7  CL 105  --  109  CO2 22  --  21*  BUN 32*  --  40*  CREATININE 3.11* 3.47* 2.24*   Liver Panel Recent Labs    07/15/21 1808 07/17/21 0420  PROT 7.1 6.5  ALBUMIN 3.3* 2.7*  AST 46* 22  ALT 41 29  ALKPHOS 118 100  BILITOT 2.0* 1.1  BILIDIR 0.8* 0.3*  IBILI 1.2* 0.8   Sedimentation Rate No results for input(s): ESRSEDRATE in the last 72 hours. C-Reactive Protein No results for input(s): CRP in the last 72 hours.  Microbiology:  Studies/Results: CT ABDOMEN PELVIS WO CONTRAST  Result Date: 07/16/2021 CLINICAL DATA:  Abdominal pain and fever. EXAM: CT ABDOMEN AND PELVIS WITHOUT CONTRAST TECHNIQUE: Multidetector CT imaging of the abdomen and pelvis was performed following the standard protocol without IV contrast. COMPARISON:  Pelvic CT dated 01/09/2020 and CT abdomen pelvis dated 11/01/2019. FINDINGS: Evaluation of this exam is limited in the absence of intravenous contrast. Lower chest: Bibasilar atelectasis/scarring. Coronary vascular calcification. No intra-abdominal free air or free fluid. Hepatobiliary: The liver is unremarkable. No intrahepatic biliary dilatation. Layering high attenuating content within the gallbladder may represent sludge or small stones. No pericholecystic fluid or evidence of acute cholecystitis  by CT. Pancreas: Unremarkable. No pancreatic ductal dilatation or surrounding inflammatory changes. Spleen: Normal in size without focal abnormality. Adrenals/Urinary Tract: The adrenal glands are unremarkable. There is no hydronephrosis or nephrolithiasis on either side. A 1 cm right renal upper pole hypodense lesion is suboptimally characterized. The visualized ureters appear unremarkable. The urinary bladder is poorly visualized and obscured by streak artifact caused by left hip  arthroplasty. Stomach/Bowel: Moderate stool throughout the colon. There is a 2 cm duodenal diverticulum without active inflammatory changes. There is no bowel obstruction or active inflammation. The appendix is normal. Vascular/Lymphatic: Moderate aortoiliac atherosclerotic disease. An infrarenal IVC filter noted. There is no adenopathy. Reproductive: The uterus is not visualized. Other: None Musculoskeletal: Degenerative changes of the spine and scoliosis. Severe right hip arthritic changes with protrusio acetabula and fragmentation of the acetabulum and medial acetabular wall. Total left hip arthroplasty. No acute osseous pathology. Right hip effusion. IMPRESSION: 1. No acute intra-abdominal or pelvic pathology. No hydronephrosis or nephrolithiasis. 2. Moderate colonic stool burden. No bowel obstruction. Normal appendix. 3. Severe right hip arthritic changes with protrusion acetabula and fragmentation of the acetabulum and medial acetabular wall. Right hip effusion. 4. Aortic Atherosclerosis (ICD10-I70.0). Electronically Signed   By: Anner Crete M.D.   On: 07/16/2021 00:58   DG Chest 1 View  Result Date: 07/15/2021 CLINICAL DATA:  Pt reports she is only here to "get checked out to see if have COVID or flu". States she has been feeling weak for the past few days but feels better today. Reports decreased eating, drinking, urine output. Last urine output this am. EXAM: CHEST  1 VIEW COMPARISON:  Chest x-ray 02/23/2021, CT chest 02/23/2021 FINDINGS: The heart and mediastinal contours are unchanged. Low lung volumes. No focal consolidation. No pulmonary edema. Trace pleural effusions not excluded. No pneumothorax. No acute osseous abnormality. Severe degenerative changes of the shoulders bilaterally. IMPRESSION: Low lung volumes.  Trace pleural effusions not excluded. Electronically Signed   By: Iven Finn M.D.   On: 07/15/2021 17:30     Assessment/Plan: ?pt presenting like sepsis but no clear source  identified No pneumonia, no cellulitis, no Uti Chronic rt hip arthritis-pain- 7cc aspirated and cell count 73K - (37 L, 55M and 41% N) not typical for pyogenic arthritis because of lower neutrophilic count , but still the  count is high, will check crystals .  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

## 2021-07-17 NOTE — Progress Notes (Incomplete)
[facility]  CARDIOLOGY CONSULT NOTE  Patient ID: Becky Trevino MRN: [id number] DOB/AGE: [birth year] 75 y.o.  Admit date: 07/16/2021 Referring Physician Kasa Primary Physician [name], MD Primary Cardiologist [facility] Reason for Consultation SVT  HPI:  Becky Trevino is a 75 year old female with a history of type 2 diabetes, hypertension, hyperlipidemia, obesity, OSA, SVT, paroxysmal A. fib, chronic DVT of the right lower extremity status post IVC filter who was admitted with septic shock of unknown source.  Cardiology is consulted for SVT at a rate of 160 bpm. Converted with 6 mg adenosine.  Interval history - ***  Review of systems complete and found to be negative unless listed above     Past Medical History:  Diagnosis Date   Cervical spondylosis    Chronic kidney disease    Stage 3   DDD (degenerative disc disease), cervical    [facility]   Diabetes mellitus without complication (HCC)    Diverticulosis    Hyperlipidemia    Hypertension    Intertrigo    Leg cramps    Leg varices    Obesity    OSA (obstructive sleep apnea)    Symptomatic menopausal or female climacteric states    Syncope     Past Surgical History:  Procedure Laterality Date   ABDOMINAL HYSTERECTOMY  1993   Total   BUNIONECTOMY Bilateral 1993   I & D KNEE WITH POLY EXCHANGE Right 11/19/2017   Procedure: RIGHT KNEE POLY EXCHANGE WITH IRRIGATION AND DEBRIDEMENT;  Surgeon: Hessie Knows, MD;  Location: [facility];  Service: Orthopedics;  Laterality: Right;   JOINT REPLACEMENT     bilateral knee   LAMINECTOMY  11/14/2013    Cervical Fusion , [facility], Dr. Delilah Shan   LASER ABLATION Bilateral 07/29/2012   Dr. Lucky Cowboy   LOWER EXTREMITY ANGIOGRAPHY Right 11/02/2019   Procedure: Lower Extremity Angiography;  Surgeon: Katha Cabal, MD;  Location: [place] [facility];  Service: Cardiovascular;  Laterality: Right;   ORIF ANKLE FRACTURE Right 09/02/2019   Procedure: OPEN REDUCTION INTERNAL  FIXATION (ORIF) ANKLE FRACTURE BIMALLEOLAR;  Surgeon: [name], DPM;  Location: [facility];  Service: Podiatry;  Laterality: Right;   PERIPHERAL VASCULAR THROMBECTOMY Right 03/02/2020   Procedure: PERIPHERAL VASCULAR THROMBECTOMY;  Surgeon: Algernon Huxley, MD;  Location: [place] [facility];  Service: Cardiovascular;  Laterality: Right;   REPLACEMENT TOTAL KNEE Left 03/2010   Dr. Rudene Christians   TOTAL HIP ARTHROPLASTY Left 12/11/2015   Procedure: TOTAL HIP ARTHROPLASTY ANTERIOR APPROACH;  Surgeon: Hessie Knows, MD;  Location: [facility];  Service: Orthopedics;  Laterality: Left;   TOTAL KNEE ARTHROPLASTY Right 03/11/2016   Procedure: TOTAL KNEE ARTHROPLASTY;  Surgeon: Hessie Knows, MD;  Location: [facility];  Service: Orthopedics;  Laterality: Right;    Medications Prior to Admission  Medication Sig Dispense Refill Last Dose   ASPIRIN LOW DOSE 81 MG EC tablet TAKE 1 TABLET BY MOUTH EVERY DAY 90 tablet 0 07/15/2021   Calcium Carbonate-Vitamin D 600-400 MG-UNIT tablet Take 1 tablet by mouth 2 (two) times daily. 60 tablet 0 07/15/2021   cephALEXin (KEFLEX) 500 MG capsule Take 500 mg by mouth 3 (three) times daily.   07/15/2021   doxycycline (VIBRAMYCIN) 100 MG capsule Take 100 mg by mouth 2 (two) times daily.   07/15/2021   levothyroxine (SYNTHROID) 25 MCG tablet TAKE 1 TABLET (25 MCG TOTAL) BY MOUTH DAILY AT 6 (SIX) AM. 90 tablet 1 07/15/2021   Magnesium Oxide 500 MG CAPS Take 500 mg by  mouth daily.    07/15/2021   metoprolol succinate (TOPROL-XL) 25 MG 24 hr tablet TAKE 1 TABLET (25 MG TOTAL) BY MOUTH DAILY. 90 tablet 1 07/15/2021   mirabegron ER (MYRBETRIQ) 50 MG TB24 tablet Take 1 tablet (50 mg total) by mouth daily. 90 tablet 3 07/15/2021   oxyCODONE (OXY IR/ROXICODONE) 5 MG immediate release tablet Take 5 mg by mouth every 4 (four) hours as needed.   07/15/2021   potassium chloride (KLOR-CON) 10 MEQ tablet Take 10 mEq by mouth daily.   07/15/2021   SIMBRINZA 1-0.2 % SUSP Place 1 drop into both eyes 2 (two)  times daily.   07/15/2021   acetaminophen (TYLENOL) 650 MG CR tablet Take 650-1,300 mg by mouth 2 (two) times daily as needed for pain.   prn at prn   atorvastatin (LIPITOR) 40 MG tablet Take 1 tablet (40 mg total) by mouth every evening. 90 tablet 1 07/14/2021   fluticasone (FLONASE) 50 MCG/ACT nasal spray SPRAY 2 SPRAYS INTO EACH NOSTRIL EVERY DAY 48 mL 0 prn at prn   ketoconazole (NIZORAL) 2 % cream Apply 1 application topically 2 (two) times daily as needed (rash).    prn at prn   latanoprost (XALATAN) 0.005 % ophthalmic solution SMARTSIG:In Eye(s)   07/14/2021   polyethylene glycol (MIRALAX / GLYCOLAX) 17 g packet Take 17 g by mouth daily. 14 each 0 prn at prn   torsemide (DEMADEX) 20 MG tablet Take 1 tablet (20 mg total) by mouth daily. 90 tablet 1    traZODone (DESYREL) 50 MG tablet Take 0.5-1 tablets (25-50 mg total) by mouth at bedtime as needed for sleep. 90 tablet 1 07/14/2021   Social History   Socioeconomic History   Marital status: Married    Spouse name: Not on file   Number of children: 1   Years of education: Not on file   Highest education level: Not on file  Occupational History   Not on file  Tobacco Use   Smoking status: Never   Smokeless tobacco: Never  Vaping Use   Vaping Use: Never used  Substance and Sexual Activity   Alcohol use: No    Alcohol/week: 0.0 standard drinks   Drug use: No   Sexual activity: Yes    Partners: Male  Other Topics Concern   Not on file  Social History Narrative   Married, one adopted grown child    Social Determinants of Radio broadcast assistant Strain: Low Risk    Difficulty of Paying Living Expenses: Not hard at all  Food Insecurity: No Food Insecurity   Worried About Charity fundraiser in the Last Year: Never true   Arboriculturist in the Last Year: Never true  Transportation Needs: No Transportation Needs   Lack of Transportation (Medical): No   Lack of Transportation (Non-Medical): No  Physical Activity: Inactive    Days of Exercise per Week: 0 days   Minutes of Exercise per Session: 30 min  Stress: No Stress Concern Present   Feeling of Stress : Not at all  Social Connections: Socially Integrated   Frequency of Communication with Friends and Family: More than three times a week   Frequency of Social Gatherings with Friends and Family: More than three times a week   Attends Religious Services: More than 4 times per year   Active Member of [employer] or Organizations: Yes   Attends Archivist Meetings: Never   Marital Status: Married  Human resources officer Violence: Not  At Risk   Fear of Current or Ex-Partner: No   Emotionally Abused: No   Physically Abused: No   Sexually Abused: No    Family History  Problem Relation Age of Onset   Diabetes Mother    Hyperlipidemia Mother    Hypertension Mother    Diabetes Father    Hyperlipidemia Father    Hypertension Father    Obesity Father    Hypertension Sister    Hyperlipidemia Sister    Hyperlipidemia Sister    Breast cancer Neg Hx       Review of systems complete and found to be negative unless listed above      PHYSICAL EXAM  Vitals:   07/17/21 0600 07/17/21 0700  BP: (!) 139/58 107/70  Pulse: (!) 141 95  Resp: 15 17  Temp:  97.9 F (36.6 C)  SpO2: 94% 93%     General: lethargic. Does not respond to voice.  HEENT:  Normocephalic and atramatic Neck:  No JVD.  Lungs: Clear bilaterally to auscultation and percussion. Heart: HRRR . Normal S1 and S2 without gallops or murmurs.  Abdomen: Bowel sounds are positive, abdomen soft and non-tender  Msk:  Back normal, normal gait. Normal strength and tone for age. Extremities: Chronic venous stasis changes.  Neuro: Alert and oriented X 3. Psych:  Good affect, responds appropriately  Labs:   Lab Results  Component Value Date   WBC 11.3 (H) 07/17/2021   HGB 12.9 07/17/2021   HCT 38.3 07/17/2021   MCV 96.2 07/17/2021   PLT 125 (L) 07/17/2021    Recent Labs  Lab 07/17/21 0420   NA 136  K 4.7  CL 109  CO2 21*  BUN 40*  CREATININE 2.24*  CALCIUM 8.3*  PROT 6.5  BILITOT 1.1  ALKPHOS 100  ALT 29  AST 22  GLUCOSE 114*    Lab Results  Component Value Date   CKTOTAL 194 (H) 11/14/2020   TROPONINI < 0.02 05/22/2012     Lab Results  Component Value Date   CHOL 157 02/04/2021   CHOL 152 01/27/2020   CHOL 148 11/25/2018   Lab Results  Component Value Date   HDL 73 02/04/2021   HDL 69 01/27/2020   HDL 73 11/25/2018   Lab Results  Component Value Date   LDLCALC 69 02/04/2021   LDLCALC 64 01/27/2020   LDLCALC 62 11/25/2018   Lab Results  Component Value Date   TRIG 71 02/04/2021   TRIG 103 01/27/2020   TRIG 51 11/25/2018   Lab Results  Component Value Date   CHOLHDL 2.2 02/04/2021   CHOLHDL 2.2 01/27/2020   CHOLHDL 2.0 11/25/2018   No results found for: LDLDIRECT    Radiology: CT ABDOMEN PELVIS WO CONTRAST  Result Date: 07/16/2021 CLINICAL DATA:  Abdominal pain and fever. EXAM: CT ABDOMEN AND PELVIS WITHOUT CONTRAST TECHNIQUE: Multidetector CT imaging of the abdomen and pelvis was performed following the standard protocol without IV contrast. COMPARISON:  Pelvic CT dated 01/09/2020 and CT abdomen pelvis dated 11/01/2019. FINDINGS: Evaluation of this exam is limited in the absence of intravenous contrast. Lower chest: Bibasilar atelectasis/scarring. Coronary vascular calcification. No intra-abdominal free air or free fluid. Hepatobiliary: The liver is unremarkable. No intrahepatic biliary dilatation. Layering high attenuating content within the gallbladder may represent sludge or small stones. No pericholecystic fluid or evidence of acute cholecystitis by CT. Pancreas: Unremarkable. No pancreatic ductal dilatation or surrounding inflammatory changes. Spleen: Normal in size without focal abnormality. Adrenals/Urinary Tract: The adrenal glands  are unremarkable. There is no hydronephrosis or nephrolithiasis on either side. A 1 cm right renal upper  pole hypodense lesion is suboptimally characterized. The visualized ureters appear unremarkable. The urinary bladder is poorly visualized and obscured by streak artifact caused by left hip arthroplasty. Stomach/Bowel: Moderate stool throughout the colon. There is a 2 cm duodenal diverticulum without active inflammatory changes. There is no bowel obstruction or active inflammation. The appendix is normal. Vascular/Lymphatic: Moderate aortoiliac atherosclerotic disease. An infrarenal IVC filter noted. There is no adenopathy. Reproductive: The uterus is not visualized. Other: None Musculoskeletal: Degenerative changes of the spine and scoliosis. Severe right hip arthritic changes with protrusio acetabula and fragmentation of the acetabulum and medial acetabular wall. Total left hip arthroplasty. No acute osseous pathology. Right hip effusion. IMPRESSION: 1. No acute intra-abdominal or pelvic pathology. No hydronephrosis or nephrolithiasis. 2. Moderate colonic stool burden. No bowel obstruction. Normal appendix. 3. Severe right hip arthritic changes with protrusion acetabula and fragmentation of the acetabulum and medial acetabular wall. Right hip effusion. 4. Aortic Atherosclerosis (ICD10-I70.0). Electronically Signed   By: Anner Crete M.D.   On: 07/16/2021 00:58   DG Chest 1 View  Result Date: 07/15/2021 CLINICAL DATA:  Pt reports she is only here to "get checked out to see if have COVID or flu". States she has been feeling weak for the past few days but feels better today. Reports decreased eating, drinking, urine output. Last urine output this am. EXAM: CHEST  1 VIEW COMPARISON:  Chest x-ray 02/23/2021, CT chest 02/23/2021 FINDINGS: The heart and mediastinal contours are unchanged. Low lung volumes. No focal consolidation. No pulmonary edema. Trace pleural effusions not excluded. No pneumothorax. No acute osseous abnormality. Severe degenerative changes of the shoulders bilaterally. IMPRESSION: Low lung  volumes.  Trace pleural effusions not excluded. Electronically Signed   By: Iven Finn M.D.   On: 07/15/2021 17:30    EKG: SVT at rate of 160 bpm  Echocardiogram 2D complete: (09/07/2020) IMPRESSIONS   1. Left ventricular ejection fraction, by estimation, is 60 to 65%. The left ventricle has normal function. The left ventricle has no regional wall motion abnormalities. Left ventricular diastolic parameters were normal.   2. Right ventricular systolic function is normal. The right ventricular size is normal.   3. The mitral valve is normal in structure. Trivial mitral valve regurgitation.   4. The aortic valve is normal in structure. Aortic valve regurgitation is not visualized.   ASSESSMENT AND PLAN:  Becky Trevino is a 75 year old female with a history of type 2 diabetes, hypertension, hyperlipidemia, obesity, OSA, SVT, paroxysmal A. fib, chronic DVT of the right lower extremity status post IVC filter who was admitted with septic shock of unknown source.  Cardiology is consulted for SVT at a rate of 160 bpm.  # SVT #Multisystem organ failure She has a previous history of SVT, though in the past there has been some consideration that this may in fact be atrial flutter.  She developed SVT today with a rate of 160 in the setting of multisystem organ failure which resolved with 6 mg of IV adenosine.  Per report there were no apparent flutter waves prior to cardioversion. -Adenosine as needed for recurrent SVT -When shock resolves, recommend resumption of home metoprolol -If SVT becomes incessant or more frequent consider initiation of amiodarone while patient is in shock.  Signed: Andrez Grime MD 07/17/2021, 7:53 AM

## 2021-07-17 NOTE — Progress Notes (Signed)
[facility] Cardiology  CARDIOLOGY CONSULT NOTE  Patient ID: Becky Trevino MRN: [id number] DOB/AGE: 75-Jun-1947 75 y.o.  Admit date: 07/16/2021 Referring Physician Kasa Primary Physician [name], MD Primary Cardiologist [facility] Reason for Consultation SVT  HPI:  Becky Trevino is a 75 year old female with a history of type 2 diabetes, hypertension, hyperlipidemia, obesity, OSA, SVT, paroxysmal A. fib, chronic DVT of the right lower extremity status post IVC filter who was admitted with septic shock of unknown source.  Cardiology is consulted for SVT at a rate of 160 bpm. Converted with 6 mg adenosine.  Interval history - No acute events overnight.  - Developed AF this morning, but back in NSR after initiation of amiodarone.  - Much more alert today.   Review of systems complete and found to be negative unless listed above     Past Medical History:  Diagnosis Date   Cervical spondylosis    Chronic kidney disease    Stage 3   DDD (degenerative disc disease), cervical    [facility]   Diabetes mellitus without complication (HCC)    Diverticulosis    Hyperlipidemia    Hypertension    Intertrigo    Leg cramps    Leg varices    Obesity    OSA (obstructive sleep apnea)    Symptomatic menopausal or female climacteric states    Syncope     Past Surgical History:  Procedure Laterality Date   ABDOMINAL HYSTERECTOMY  1993   Total   BUNIONECTOMY Bilateral 1993   I & D KNEE WITH POLY EXCHANGE Right 11/19/2017   Procedure: RIGHT KNEE POLY EXCHANGE WITH IRRIGATION AND DEBRIDEMENT;  Surgeon: Hessie Knows, MD;  Location: [facility] ORS;  Service: Orthopedics;  Laterality: Right;   JOINT REPLACEMENT     bilateral knee   LAMINECTOMY  11/14/2013    Cervical Fusion , [facility], Dr. Delilah Shan   LASER ABLATION Bilateral 07/29/2012   Dr. Lucky Cowboy   LOWER EXTREMITY ANGIOGRAPHY Right 11/02/2019   Procedure: Lower Extremity Angiography;  Surgeon: Katha Cabal, MD;  Location: [place] [facility];  Service: Cardiovascular;  Laterality: Right;   ORIF ANKLE FRACTURE Right 09/02/2019   Procedure: OPEN REDUCTION INTERNAL FIXATION (ORIF) ANKLE FRACTURE BIMALLEOLAR;  Surgeon: [name], DPM;  Location: [facility] ORS;  Service: Podiatry;  Laterality: Right;   PERIPHERAL VASCULAR THROMBECTOMY Right 03/02/2020   Procedure: PERIPHERAL VASCULAR THROMBECTOMY;  Surgeon: Algernon Huxley, MD;  Location: [place] [facility];  Service: Cardiovascular;  Laterality: Right;   REPLACEMENT TOTAL KNEE Left 03/2010   Dr. Rudene Christians   TOTAL HIP ARTHROPLASTY Left 12/11/2015   Procedure: TOTAL HIP ARTHROPLASTY ANTERIOR APPROACH;  Surgeon: Hessie Knows, MD;  Location: [facility] ORS;  Service: Orthopedics;  Laterality: Left;   TOTAL KNEE ARTHROPLASTY Right 03/11/2016   Procedure: TOTAL KNEE ARTHROPLASTY;  Surgeon: Hessie Knows, MD;  Location: [facility] ORS;  Service: Orthopedics;  Laterality: Right;    Medications Prior to Admission  Medication Sig Dispense Refill Last Dose   ASPIRIN LOW DOSE 81 MG EC tablet TAKE 1 TABLET BY MOUTH EVERY DAY 90 tablet 0 07/15/2021   Calcium Carbonate-Vitamin D 600-400 MG-UNIT tablet Take 1 tablet by mouth 2 (two) times daily. 60 tablet 0 07/15/2021   cephALEXin (KEFLEX) 500 MG capsule Take 500 mg by mouth 3 (three) times daily.   07/15/2021   doxycycline (VIBRAMYCIN) 100 MG capsule Take 100 mg by mouth 2 (two) times daily.   07/15/2021   levothyroxine (SYNTHROID) 25 MCG tablet TAKE 1 TABLET (25  MCG TOTAL) BY MOUTH DAILY AT 6 (SIX) AM. 90 tablet 1 07/15/2021   Magnesium Oxide 500 MG CAPS Take 500 mg by mouth daily.    07/15/2021   metoprolol succinate (TOPROL-XL) 25 MG 24 hr tablet TAKE 1 TABLET (25 MG TOTAL) BY MOUTH DAILY. 90 tablet 1 07/15/2021   mirabegron ER (MYRBETRIQ) 50 MG TB24 tablet Take 1 tablet (50 mg total) by mouth daily. 90 tablet 3 07/15/2021   oxyCODONE (OXY IR/ROXICODONE) 5 MG immediate release tablet Take 5 mg by mouth every 4 (four) hours as needed.   07/15/2021   potassium  chloride (KLOR-CON) 10 MEQ tablet Take 10 mEq by mouth daily.   07/15/2021   SIMBRINZA 1-0.2 % SUSP Place 1 drop into both eyes 2 (two) times daily.   07/15/2021   acetaminophen (TYLENOL) 650 MG CR tablet Take 650-1,300 mg by mouth 2 (two) times daily as needed for pain.   prn at prn   atorvastatin (LIPITOR) 40 MG tablet Take 1 tablet (40 mg total) by mouth every evening. 90 tablet 1 07/14/2021   fluticasone (FLONASE) 50 MCG/ACT nasal spray SPRAY 2 SPRAYS INTO EACH NOSTRIL EVERY DAY 48 mL 0 prn at prn   ketoconazole (NIZORAL) 2 % cream Apply 1 application topically 2 (two) times daily as needed (rash).    prn at prn   latanoprost (XALATAN) 0.005 % ophthalmic solution SMARTSIG:In Eye(s)   07/14/2021   polyethylene glycol (MIRALAX / GLYCOLAX) 17 g packet Take 17 g by mouth daily. 14 each 0 prn at prn   torsemide (DEMADEX) 20 MG tablet Take 1 tablet (20 mg total) by mouth daily. 90 tablet 1    traZODone (DESYREL) 50 MG tablet Take 0.5-1 tablets (25-50 mg total) by mouth at bedtime as needed for sleep. 90 tablet 1 07/14/2021   Social History   Socioeconomic History   Marital status: Married    Spouse name: Not on file   Number of children: 1   Years of education: Not on file   Highest education level: Not on file  Occupational History   Not on file  Tobacco Use   Smoking status: Never   Smokeless tobacco: Never  Vaping Use   Vaping Use: Never used  Substance and Sexual Activity   Alcohol use: No    Alcohol/week: 0.0 standard drinks   Drug use: No   Sexual activity: Yes    Partners: Male  Other Topics Concern   Not on file  Social History Narrative   Married, one adopted grown child    Social Determinants of Radio broadcast assistant Strain: Low Risk    Difficulty of Paying Living Expenses: Not hard at all  Food Insecurity: No Food Insecurity   Worried About Charity fundraiser in the Last Year: Never true   Arboriculturist in the Last Year: Never true  Transportation Needs:  No Transportation Needs   Lack of Transportation (Medical): No   Lack of Transportation (Non-Medical): No  Physical Activity: Inactive   Days of Exercise per Week: 0 days   Minutes of Exercise per Session: 30 min  Stress: No Stress Concern Present   Feeling of Stress : Not at all  Social Connections: Socially Integrated   Frequency of Communication with Friends and Family: More than three times a week   Frequency of Social Gatherings with Friends and Family: More than three times a week   Attends Religious Services: More than 4 times per year   Active  Member of Clubs or Organizations: Yes   Attends Archivist Meetings: Never   Marital Status: Married  Human resources officer Violence: Not At Risk   Fear of Current or Ex-Partner: No   Emotionally Abused: No   Physically Abused: No   Sexually Abused: No    Family History  Problem Relation Age of Onset   Diabetes Mother    Hyperlipidemia Mother    Hypertension Mother    Diabetes Father    Hyperlipidemia Father    Hypertension Father    Obesity Father    Hypertension Sister    Hyperlipidemia Sister    Hyperlipidemia Sister    Breast cancer Neg Hx       Review of systems complete and found to be negative unless listed above      PHYSICAL EXAM  Vitals:   07/17/21 1300 07/17/21 1315  BP: (!) 106/54 (!) 114/55  Pulse: 83 74  Resp: (!) 23 (!) 22  Temp:    SpO2: 100% 99%     General: lethargic. Does not respond to voice.  HEENT:  Normocephalic and atramatic Neck:  No JVD.  Lungs: Clear bilaterally to auscultation and percussion. Heart: HRRR . Normal S1 and S2 without gallops or murmurs.  Abdomen: Bowel sounds are positive, abdomen soft and non-tender  Msk:  Back normal, normal gait. Normal strength and tone for age. Extremities: Chronic venous stasis changes.  Neuro: Alert and oriented X 3. Psych:  Good affect, responds appropriately  Labs:   Lab Results  Component Value Date   WBC 11.3 (H) 07/17/2021    HGB 12.9 07/17/2021   HCT 38.3 07/17/2021   MCV 96.2 07/17/2021   PLT 125 (L) 07/17/2021    Recent Labs  Lab 07/17/21 0420  NA 136  K 4.7  CL 109  CO2 21*  BUN 40*  CREATININE 2.24*  CALCIUM 8.3*  PROT 6.5  BILITOT 1.1  ALKPHOS 100  ALT 29  AST 22  GLUCOSE 114*    Lab Results  Component Value Date   CKTOTAL 194 (H) 11/14/2020   TROPONINI < 0.02 05/22/2012     Lab Results  Component Value Date   CHOL 157 02/04/2021   CHOL 152 01/27/2020   CHOL 148 11/25/2018   Lab Results  Component Value Date   HDL 73 02/04/2021   HDL 69 01/27/2020   HDL 73 11/25/2018   Lab Results  Component Value Date   LDLCALC 69 02/04/2021   LDLCALC 64 01/27/2020   LDLCALC 62 11/25/2018   Lab Results  Component Value Date   TRIG 71 02/04/2021   TRIG 103 01/27/2020   TRIG 51 11/25/2018   Lab Results  Component Value Date   CHOLHDL 2.2 02/04/2021   CHOLHDL 2.2 01/27/2020   CHOLHDL 2.0 11/25/2018   No results found for: LDLDIRECT    Radiology: CT ABDOMEN PELVIS WO CONTRAST  Result Date: 07/16/2021 CLINICAL DATA:  Abdominal pain and fever. EXAM: CT ABDOMEN AND PELVIS WITHOUT CONTRAST TECHNIQUE: Multidetector CT imaging of the abdomen and pelvis was performed following the standard protocol without IV contrast. COMPARISON:  Pelvic CT dated 01/09/2020 and CT abdomen pelvis dated 11/01/2019. FINDINGS: Evaluation of this exam is limited in the absence of intravenous contrast. Lower chest: Bibasilar atelectasis/scarring. Coronary vascular calcification. No intra-abdominal free air or free fluid. Hepatobiliary: The liver is unremarkable. No intrahepatic biliary dilatation. Layering high attenuating content within the gallbladder may represent sludge or small stones. No pericholecystic fluid or evidence of acute cholecystitis by  CT. Pancreas: Unremarkable. No pancreatic ductal dilatation or surrounding inflammatory changes. Spleen: Normal in size without focal abnormality. Adrenals/Urinary  Tract: The adrenal glands are unremarkable. There is no hydronephrosis or nephrolithiasis on either side. A 1 cm right renal upper pole hypodense lesion is suboptimally characterized. The visualized ureters appear unremarkable. The urinary bladder is poorly visualized and obscured by streak artifact caused by left hip arthroplasty. Stomach/Bowel: Moderate stool throughout the colon. There is a 2 cm duodenal diverticulum without active inflammatory changes. There is no bowel obstruction or active inflammation. The appendix is normal. Vascular/Lymphatic: Moderate aortoiliac atherosclerotic disease. An infrarenal IVC filter noted. There is no adenopathy. Reproductive: The uterus is not visualized. Other: None Musculoskeletal: Degenerative changes of the spine and scoliosis. Severe right hip arthritic changes with protrusio acetabula and fragmentation of the acetabulum and medial acetabular wall. Total left hip arthroplasty. No acute osseous pathology. Right hip effusion. IMPRESSION: 1. No acute intra-abdominal or pelvic pathology. No hydronephrosis or nephrolithiasis. 2. Moderate colonic stool burden. No bowel obstruction. Normal appendix. 3. Severe right hip arthritic changes with protrusion acetabula and fragmentation of the acetabulum and medial acetabular wall. Right hip effusion. 4. Aortic Atherosclerosis (ICD10-I70.0). Electronically Signed   By: Anner Crete M.D.   On: 07/16/2021 00:58   DG Chest 1 View  Result Date: 07/15/2021 CLINICAL DATA:  Pt reports she is only here to "get checked out to see if have COVID or flu". States she has been feeling weak for the past few days but feels better today. Reports decreased eating, drinking, urine output. Last urine output this am. EXAM: CHEST  1 VIEW COMPARISON:  Chest x-ray 02/23/2021, CT chest 02/23/2021 FINDINGS: The heart and mediastinal contours are unchanged. Low lung volumes. No focal consolidation. No pulmonary edema. Trace pleural effusions not  excluded. No pneumothorax. No acute osseous abnormality. Severe degenerative changes of the shoulders bilaterally. IMPRESSION: Low lung volumes.  Trace pleural effusions not excluded. Electronically Signed   By: Iven Finn M.D.   On: 07/15/2021 17:30    EKG: SVT at rate of 160 bpm  Echocardiogram 2D complete: (09/07/2020) IMPRESSIONS   1. Left ventricular ejection fraction, by estimation, is 60 to 65%. The left ventricle has normal function. The left ventricle has no regional wall motion abnormalities. Left ventricular diastolic parameters were normal.   2. Right ventricular systolic function is normal. The right ventricular size is normal.   3. The mitral valve is normal in structure. Trivial mitral valve regurgitation.   4. The aortic valve is normal in structure. Aortic valve regurgitation is not visualized.   ASSESSMENT AND PLAN:  Mianna Iezzi is a 75 year old female with a history of type 2 diabetes, hypertension, hyperlipidemia, obesity, OSA, SVT, paroxysmal A. fib, chronic DVT of the right lower extremity status post IVC filter who was admitted with septic shock of unknown source.  Cardiology is consulted for SVT at a rate of 160 bpm.  # SVT #Multisystem organ failure She has a previous history of SVT, though in the past there has been some consideration that this may in fact be atrial flutter.  She developed SVT with a rate of 160 in the setting of multisystem organ failure which resolved with 6 mg of IV adenosine.  Per report there were no apparent flutter waves prior to cardioversion. -Adenosine as needed for recurrent SVT -When shock resolves, recommend resumption of home metoprolol -On amiodarone now for AF  # Atrial fibrillation/ flutter Developed on AM of 11/2, now back in NSR after amiodarone  given. Some question previously about whether her SVT was in fact flutter.  - [place] 2 Vasc = 4 (2 age, DM, female). Need to discuss outpatient anticoagulation for stroke risk reduction.   - Continue amiodarone IV x 24 hrs, then convert to 400 mg BID x 7 days. Then 200 mg thereafter - Resume home metoprolol when shock improves.   Signed: Andrez Grime MD 07/17/2021, 4:02 PM

## 2021-07-18 DIAGNOSIS — D72829 Elevated white blood cell count, unspecified: Secondary | ICD-10-CM | POA: Diagnosis not present

## 2021-07-18 DIAGNOSIS — M1611 Unilateral primary osteoarthritis, right hip: Secondary | ICD-10-CM | POA: Insufficient documentation

## 2021-07-18 DIAGNOSIS — R6510 Systemic inflammatory response syndrome (SIRS) of non-infectious origin without acute organ dysfunction: Secondary | ICD-10-CM | POA: Diagnosis not present

## 2021-07-18 DIAGNOSIS — I471 Supraventricular tachycardia: Secondary | ICD-10-CM | POA: Diagnosis not present

## 2021-07-18 DIAGNOSIS — M25451 Effusion, right hip: Secondary | ICD-10-CM | POA: Insufficient documentation

## 2021-07-18 DIAGNOSIS — Z7189 Other specified counseling: Secondary | ICD-10-CM | POA: Diagnosis not present

## 2021-07-18 DIAGNOSIS — E441 Mild protein-calorie malnutrition: Secondary | ICD-10-CM

## 2021-07-18 DIAGNOSIS — Z515 Encounter for palliative care: Secondary | ICD-10-CM | POA: Diagnosis not present

## 2021-07-18 DIAGNOSIS — R6521 Severe sepsis with septic shock: Secondary | ICD-10-CM | POA: Diagnosis not present

## 2021-07-18 DIAGNOSIS — A419 Sepsis, unspecified organism: Secondary | ICD-10-CM | POA: Diagnosis not present

## 2021-07-18 LAB — BASIC METABOLIC PANEL
Anion gap: 7 (ref 5–15)
BUN: 38 mg/dL — ABNORMAL HIGH (ref 8–23)
CO2: 20 mmol/L — ABNORMAL LOW (ref 22–32)
Calcium: 8.4 mg/dL — ABNORMAL LOW (ref 8.9–10.3)
Chloride: 108 mmol/L (ref 98–111)
Creatinine, Ser: 1.43 mg/dL — ABNORMAL HIGH (ref 0.44–1.00)
GFR, Estimated: 38 mL/min — ABNORMAL LOW (ref >60)
Glucose, Bld: 110 mg/dL — ABNORMAL HIGH (ref 70–99)
Potassium: 3.9 mmol/L (ref 3.5–5.1)
Sodium: 135 mmol/L (ref 135–145)

## 2021-07-18 LAB — CBC WITH DIFFERENTIAL/PLATELET
Abs Immature Granulocytes: 0.11 10*3/uL — ABNORMAL HIGH (ref 0.00–0.07)
Basophils Absolute: 0 10*3/uL (ref 0.0–0.1)
Basophils Relative: 0 %
Eosinophils Absolute: 0.1 10*3/uL (ref 0.0–0.5)
Eosinophils Relative: 1 %
HCT: 34.8 % — ABNORMAL LOW (ref 36.0–46.0)
Hemoglobin: 11.4 g/dL — ABNORMAL LOW (ref 12.0–15.0)
Immature Granulocytes: 1 %
Lymphocytes Relative: 11 %
Lymphs Abs: 0.9 10*3/uL (ref 0.7–4.0)
MCH: 31.1 pg (ref 26.0–34.0)
MCHC: 32.8 g/dL (ref 30.0–36.0)
MCV: 95.1 fL (ref 80.0–100.0)
Monocytes Absolute: 0.3 10*3/uL (ref 0.1–1.0)
Monocytes Relative: 3 %
Neutro Abs: 7.4 10*3/uL (ref 1.7–7.7)
Neutrophils Relative %: 84 %
Platelets: 116 10*3/uL — ABNORMAL LOW (ref 150–400)
RBC: 3.66 MIL/uL — ABNORMAL LOW (ref 3.87–5.11)
RDW: 16.2 % — ABNORMAL HIGH (ref 11.5–15.5)
Smear Review: NORMAL
WBC: 8.8 10*3/uL (ref 4.0–10.5)
nRBC: 0 % (ref 0.0–0.2)

## 2021-07-18 LAB — GLUCOSE, CAPILLARY
Glucose-Capillary: 100 mg/dL — ABNORMAL HIGH (ref 70–99)
Glucose-Capillary: 101 mg/dL — ABNORMAL HIGH (ref 70–99)
Glucose-Capillary: 102 mg/dL — ABNORMAL HIGH (ref 70–99)
Glucose-Capillary: 119 mg/dL — ABNORMAL HIGH (ref 70–99)
Glucose-Capillary: 123 mg/dL — ABNORMAL HIGH (ref 70–99)
Glucose-Capillary: 95 mg/dL (ref 70–99)
Glucose-Capillary: 97 mg/dL (ref 70–99)

## 2021-07-18 LAB — PROCALCITONIN: Procalcitonin: 0.44 ng/mL

## 2021-07-18 LAB — PHOSPHORUS: Phosphorus: 2.6 mg/dL (ref 2.5–4.6)

## 2021-07-18 LAB — MAGNESIUM: Magnesium: 2.7 mg/dL — ABNORMAL HIGH (ref 1.7–2.4)

## 2021-07-18 LAB — C-REACTIVE PROTEIN: CRP: 11.2 mg/dL — ABNORMAL HIGH (ref <1.0)

## 2021-07-18 LAB — CK: Total CK: 133 U/L (ref 38–234)

## 2021-07-18 LAB — SEDIMENTATION RATE: Sed Rate: 49 mm/hr — ABNORMAL HIGH (ref 0–30)

## 2021-07-18 MED ORDER — POTASSIUM CHLORIDE CRYS ER 10 MEQ PO TBCR
10.0000 meq | EXTENDED_RELEASE_TABLET | Freq: Every day | ORAL | Status: DC
  Administered 2021-07-18 – 2021-07-20 (×3): 10 meq via ORAL
  Filled 2021-07-18 (×3): qty 1

## 2021-07-18 MED ORDER — GUAIFENESIN-DM 100-10 MG/5ML PO SYRP
10.0000 mL | ORAL_SOLUTION | ORAL | Status: DC | PRN
  Administered 2021-07-18 – 2021-07-19 (×3): 10 mL via ORAL
  Filled 2021-07-18 (×3): qty 10

## 2021-07-18 MED ORDER — OYSTER SHELL CALCIUM/D3 500-5 MG-MCG PO TABS
1.0000 | ORAL_TABLET | Freq: Two times a day (BID) | ORAL | Status: DC
Start: 2021-07-18 — End: 2021-07-20
  Administered 2021-07-18 – 2021-07-20 (×5): 1 via ORAL
  Filled 2021-07-18 (×5): qty 1

## 2021-07-18 MED ORDER — HYDROCORTISONE SOD SUC (PF) 100 MG IJ SOLR
50.0000 mg | Freq: Two times a day (BID) | INTRAMUSCULAR | Status: DC
Start: 2021-07-18 — End: 2021-07-18

## 2021-07-18 MED ORDER — METOPROLOL SUCCINATE ER 25 MG PO TB24
25.0000 mg | ORAL_TABLET | Freq: Every day | ORAL | Status: DC
  Administered 2021-07-18 – 2021-07-20 (×3): 25 mg via ORAL
  Filled 2021-07-18 (×3): qty 1

## 2021-07-18 MED ORDER — MIDODRINE HCL 5 MG PO TABS: 5.0000 mg | ORAL_TABLET | Freq: Two times a day (BID) | ORAL | Status: DC

## 2021-07-18 MED ORDER — MAGNESIUM OXIDE -MG SUPPLEMENT 400 (240 MG) MG PO TABS
400.0000 mg | ORAL_TABLET | Freq: Every day | ORAL | Status: DC
  Administered 2021-07-18 – 2021-07-20 (×3): 400 mg via ORAL
  Filled 2021-07-18 (×3): qty 1

## 2021-07-18 MED ORDER — MIRABEGRON ER 50 MG PO TB24
50.0000 mg | ORAL_TABLET | Freq: Every day | ORAL | Status: DC
  Administered 2021-07-18 – 2021-07-20 (×3): 50 mg via ORAL
  Filled 2021-07-18 (×3): qty 1

## 2021-07-18 MED ORDER — LATANOPROST 0.005 % OP SOLN
1.0000 [drp] | Freq: Every day | OPHTHALMIC | Status: DC
  Administered 2021-07-18 – 2021-07-19 (×2): 1 [drp] via OPHTHALMIC
  Filled 2021-07-18 (×2): qty 2.5

## 2021-07-18 MED ORDER — OXYCODONE HCL 5 MG PO TABS
5.0000 mg | ORAL_TABLET | ORAL | Status: DC | PRN
  Administered 2021-07-18 – 2021-07-19 (×3): 5 mg via ORAL
  Filled 2021-07-18 (×3): qty 1

## 2021-07-18 MED ORDER — AMIODARONE HCL 200 MG PO TABS
400.0000 mg | ORAL_TABLET | Freq: Two times a day (BID) | ORAL | Status: DC
  Administered 2021-07-18 – 2021-07-19 (×3): 400 mg via ORAL
  Filled 2021-07-18 (×3): qty 2

## 2021-07-18 MED ORDER — BRINZOLAMIDE 1 % OP SUSP
1.0000 [drp] | Freq: Two times a day (BID) | OPHTHALMIC | Status: DC
  Administered 2021-07-18 – 2021-07-20 (×5): 1 [drp] via OPHTHALMIC
  Filled 2021-07-18: qty 10

## 2021-07-18 MED ORDER — DOXYCYCLINE HYCLATE 100 MG PO TABS
100.0000 mg | ORAL_TABLET | Freq: Two times a day (BID) | ORAL | Status: DC
  Administered 2021-07-19 – 2021-07-20 (×3): 100 mg via ORAL
  Filled 2021-07-18 (×3): qty 1

## 2021-07-18 MED ORDER — BRIMONIDINE TARTRATE 0.2 % OP SOLN
1.0000 [drp] | Freq: Two times a day (BID) | OPHTHALMIC | Status: DC
  Administered 2021-07-18 – 2021-07-20 (×5): 1 [drp] via OPHTHALMIC
  Filled 2021-07-18: qty 5

## 2021-07-18 MED ORDER — MIDODRINE HCL 5 MG PO TABS
5.0000 mg | ORAL_TABLET | Freq: Three times a day (TID) | ORAL | Status: DC
  Administered 2021-07-18: 5 mg via ORAL
  Filled 2021-07-18: qty 1

## 2021-07-18 MED ORDER — SODIUM CHLORIDE 0.9 % IV SOLN
3.0000 g | Freq: Four times a day (QID) | INTRAVENOUS | Status: DC
  Administered 2021-07-19 – 2021-07-20 (×5): 3 g via INTRAVENOUS
  Filled 2021-07-18 (×5): qty 8
  Filled 2021-07-18: qty 3
  Filled 2021-07-18 (×2): qty 8

## 2021-07-18 NOTE — Progress Notes (Signed)
Daily Progress Note   Patient Name: Becky Trevino       Date: 07/18/2021 DOB: 02-12-1946  Age: 75 y.o. MRN#: [id number] Attending Physician: Fritzi Mandes, MD Primary Care Physician: [name], MD Admit Date: 07/16/2021 Length of Stay: 2 days  Reason for Consultation/Follow-up: Establishing goals of care  HPI/Patient Profile:  75 y.o. female  with past medical history of anasarca, SVT, AKI on CKD, thrombocytopenia, mild protein calorie malnutrition, DVT, MRSA bacteremia, chronic infection of right knee, CKD stage III, chronic renal insufficiency, diabetes, and others.  She presents to the emergency department with a chief complaint of hypotension and decreased urine output.  She was admitted on 07/16/2021 with septic shock of unknown etiology, possible right hip septic arthritis/joint infection, SVT, chronic anasarca/lymphedema with chronic diastolic CHF, severe sepsis, AKI on CKD stage III.   During her admission she went into SVT and received adenosine x2 and metoprolol.  Was on low-dose pressors (Neo-Synephrine).  She underwent IR guided right hip aspiration yesterday.  Pressors have since been weaned off.  Hip aspiration with high WBC count, cultures pending.   PMT was consulted for goals of care conversation.  Subjective:   Subjective: Chart Reviewed. Updates received. Patient Assessed. Created space and opportunity for patient  and family to explore thoughts and feelings regarding current medical situation.  Today's Discussion: I met with the patient at the bedside, no family at bedside. States she's doing well this morning, denies pain, N/V. Still having discussions with family about long term goals of care. Previously provided MOST form and Difficult Choices book for discussion. Discussed importance of ongoing discussions given multiple chronic comorbidities. Discussed benefits of outpatient palliative care at discharge, and patient has agreed. Provided emotional and general support  through therapeutic listening and other techniques. Answered all questions, addressed all concerns.  Review of Systems  Constitutional:  Positive for activity change and fatigue.       Denies pain in general  Respiratory:  Negative for cough and shortness of breath.   Gastrointestinal:  Positive for abdominal pain (with coughing). Negative for nausea and vomiting.   Objective:   Vital Signs:  BP 126/69 (BP Location: Left Arm)   Pulse 75   Temp 98.4 F (36.9 C) (Axillary)   Resp 17   Ht _0  (1.803 m)   Wt 127 kg   SpO2 97%   BMI 39.05 kg/m   Physical Exam: Physical Exam Vitals and nursing note reviewed.  Constitutional:      General: She is not in acute distress.    Appearance: She is obese. She is ill-appearing.  HENT:     Head: Normocephalic and atraumatic.  Cardiovascular:     Rate and Rhythm: Normal rate.  Pulmonary:     Effort: Pulmonary effort is normal. No respiratory distress.     Breath sounds: No wheezing or rhonchi.  Abdominal:     General: Abdomen is flat.     Palpations: Abdomen is soft.  Skin:    General: Skin is warm and dry.  Neurological:     General: No focal deficit present.     Mental Status: She is alert and oriented to person, place, and time.  Psychiatric:        Mood and Affect: Mood normal.        Behavior: Behavior normal.    Palliative Assessment/Data: 70%   Assessment & Plan:   Impression: Present on Admission: . Sepsis [facility])  Acutely ill 75 year old female with multiple chronic comorbidities as  described above.  In general her acute health issues seem to be treatable and she seems to be making progress related to use.  Still unidentified source of her sepsis, hip cultures pending.  If her sepsis and infection can improve then likely her blood pressure and heart rate will subsequently improve as well.  She is hopeful to be able to get home and get well.  She is wanting to increase her activity to help with her chronic health  issues.  SUMMARY OF RECOMMENDATIONS   Acutely ill 75 year old female with multiple chronic comorbidities as described above.  In general her acute health issues seem to be treatable and she seems to be making progress related to use.  Still unidentified source of her sepsis, hip cultures pending.  If her sepsis and infection can improve then likely her blood pressure and heart rate will subsequently improve as well.  She is hopeful to be able to get home and get well.  She is wanting to increase her activity to help with her chronic health issues.  Code Status: Full code  Prognosis: Unable to determine  Discharge Planning: To Be Determined  Discussed with: Patient, medical team, nursing tem  Thank you for allowing [place] to participate in the care of DORTHY MAGNUSSEN PMT will continue to support holistically.  Time Total: 35 min  Visit consisted of counseling and education dealing with the complex and emotionally intense issues of symptom management and palliative care in the setting of serious and potentially life-threatening illness. Greater than 50%  of this time was spent counseling and coordinating care related to the above assessment and plan.  Walden Field, NP Palliative Medicine Team  Team Phone # [phone] (Nights/Weekends)  05/14/2021, 8:17 AM

## 2021-07-18 NOTE — Progress Notes (Signed)
[facility]  CARDIOLOGY CONSULT NOTE  Patient ID: MERRISSA GIACOBBE MRN: [id number] DOB/AGE: September 22, 1945 75 y.o.  Admit date: 07/16/2021 Referring Physician Kasa Primary Physician [name], MD Primary Cardiologist [facility] Reason for Consultation SVT  HPI:  Yessenia Maillet is a 75 year old female with a history of type 2 diabetes, hypertension, hyperlipidemia, obesity, OSA, SVT, paroxysmal A. fib, chronic DVT of the right lower extremity status post IVC filter who was admitted with septic shock of unknown source.  Cardiology is consulted for SVT at a rate of 160 bpm. Converted with 6 mg adenosine.  Interval history - no acute events.  - Feels better today.   Review of systems complete and found to be negative unless listed above     Past Medical History:  Diagnosis Date   Cervical spondylosis    Chronic kidney disease    Stage 3   DDD (degenerative disc disease), cervical    [facility]   Diabetes mellitus without complication (HCC)    Diverticulosis    Hyperlipidemia    Hypertension    Intertrigo    Leg cramps    Leg varices    Obesity    OSA (obstructive sleep apnea)    Symptomatic menopausal or female climacteric states    Syncope     Past Surgical History:  Procedure Laterality Date   ABDOMINAL HYSTERECTOMY  1993   Total   BUNIONECTOMY Bilateral 1993   I & D KNEE WITH POLY EXCHANGE Right 11/19/2017   Procedure: RIGHT KNEE POLY EXCHANGE WITH IRRIGATION AND DEBRIDEMENT;  Surgeon: Hessie Knows, MD;  Location: [facility] ORS;  Service: Orthopedics;  Laterality: Right;   JOINT REPLACEMENT     bilateral knee   LAMINECTOMY  11/14/2013    Cervical Fusion , [facility], Dr. Delilah Shan   LASER ABLATION Bilateral 07/29/2012   Dr. Lucky Cowboy   LOWER EXTREMITY ANGIOGRAPHY Right 11/02/2019   Procedure: Lower Extremity Angiography;  Surgeon: Katha Cabal, MD;  Location: [place] [facility];  Service: Cardiovascular;  Laterality: Right;   ORIF ANKLE FRACTURE Right 09/02/2019    Procedure: OPEN REDUCTION INTERNAL FIXATION (ORIF) ANKLE FRACTURE BIMALLEOLAR;  Surgeon: [name], DPM;  Location: [facility] OR[facility];  Service: Podiatry;  Laterality: Right;   PERIPHERAL VASCULAR THROMBECTOMY Right 03/02/2020   Procedure: PERIPHERAL VASCULAR THROMBECTOMY;  Surgeon: Algernon Huxley, MD;  Location: [place] [facility];  Service: Cardiovascular;  Laterality: Right;   REPLACEMENT TOTAL KNEE Left 03/2010   Dr. Rudene Christians   TOTAL HIP ARTHROPLASTY Left 12/11/2015   Procedure: TOTAL HIP ARTHROPLASTY ANTERIOR APPROACH;  Surgeon: Hessie Knows, MD;  Location: [facility] OR[facility];  Service: Orthopedics;  Laterality: Left;   TOTAL KNEE ARTHROPLASTY Right 03/11/2016   Procedure: TOTAL KNEE ARTHROPLASTY;  Surgeon: Hessie Knows, MD;  Location: [facility];  Service: Orthopedics;  Laterality: Right;    Medications Prior to Admission  Medication Sig Dispense Refill Last Dose   ASPIRIN LOW DOSE 81 MG EC tablet TAKE 1 TABLET BY MOUTH EVERY DAY 90 tablet 0 07/15/2021   Calcium Carbonate-Vitamin D 600-400 MG-UNIT tablet Take 1 tablet by mouth 2 (two) times daily. 60 tablet 0 07/15/2021   cephALEXin (KEFLEX) 500 MG capsule Take 500 mg by mouth 3 (three) times daily.   07/15/2021   doxycycline (VIBRAMYCIN) 100 MG capsule Take 100 mg by mouth 2 (two) times daily.   07/15/2021   levothyroxine (SYNTHROID) 25 MCG tablet TAKE 1 TABLET (25 MCG TOTAL) BY MOUTH DAILY AT 6 (SIX) AM. 90 tablet 1 07/15/2021   Magnesium  Oxide 500 MG CAPS Take 500 mg by mouth daily.    07/15/2021   metoprolol succinate (TOPROL-XL) 25 MG 24 hr tablet TAKE 1 TABLET (25 MG TOTAL) BY MOUTH DAILY. 90 tablet 1 07/15/2021   mirabegron ER (MYRBETRIQ) 50 MG TB24 tablet Take 1 tablet (50 mg total) by mouth daily. 90 tablet 3 07/15/2021   oxyCODONE (OXY IR/ROXICODONE) 5 MG immediate release tablet Take 5 mg by mouth every 4 (four) hours as needed.   07/15/2021   potassium chloride (KLOR-CON) 10 MEQ tablet Take 10 mEq by mouth daily.   07/15/2021   SIMBRINZA 1-0.2 % SUSP  Place 1 drop into both eyes 2 (two) times daily.   07/15/2021   acetaminophen (TYLENOL) 650 MG CR tablet Take 650-1,300 mg by mouth 2 (two) times daily as needed for pain.   prn at prn   atorvastatin (LIPITOR) 40 MG tablet Take 1 tablet (40 mg total) by mouth every evening. 90 tablet 1 07/14/2021   fluticasone (FLONASE) 50 MCG/ACT nasal spray SPRAY 2 SPRAYS INTO EACH NOSTRIL EVERY DAY 48 mL 0 prn at prn   ketoconazole (NIZORAL) 2 % cream Apply 1 application topically 2 (two) times daily as needed (rash).    prn at prn   latanoprost (XALATAN) 0.005 % ophthalmic solution SMARTSIG:In Eye(s)   07/14/2021   polyethylene glycol (MIRALAX / GLYCOLAX) 17 g packet Take 17 g by mouth daily. 14 each 0 prn at prn   torsemide (DEMADEX) 20 MG tablet Take 1 tablet (20 mg total) by mouth daily. 90 tablet 1    traZODone (DESYREL) 50 MG tablet Take 0.5-1 tablets (25-50 mg total) by mouth at bedtime as needed for sleep. 90 tablet 1 07/14/2021   Social History   Socioeconomic History   Marital status: Married    Spouse name: Not on file   Number of children: 1   Years of education: Not on file   Highest education level: Not on file  Occupational History   Not on file  Tobacco Use   Smoking status: Never   Smokeless tobacco: Never  Vaping Use   Vaping Use: Never used  Substance and Sexual Activity   Alcohol use: No    Alcohol/week: 0.0 standard drinks   Drug use: No   Sexual activity: Yes    Partners: Male  Other Topics Concern   Not on file  Social History Narrative   Married, one adopted grown child    Social Determinants of Radio broadcast assistant Strain: Low Risk    Difficulty of Paying Living Expenses: Not hard at all  Food Insecurity: No Food Insecurity   Worried About Charity fundraiser in the Last Year: Never true   Arboriculturist in the Last Year: Never true  Transportation Needs: No Transportation Needs   Lack of Transportation (Medical): No   Lack of Transportation  (Non-Medical): No  Physical Activity: Inactive   Days of Exercise per Week: 0 days   Minutes of Exercise per Session: 30 min  Stress: No Stress Concern Present   Feeling of Stress : Not at all  Social Connections: Socially Integrated   Frequency of Communication with Friends and Family: More than three times a week   Frequency of Social Gatherings with Friends and Family: More than three times a week   Attends Religious Services: More than 4 times per year   Active Member of [employer] or Organizations: Yes   Attends Archivist Meetings: Never  Marital Status: Married  Human resources officer Violence: Not At Risk   Fear of Current or Ex-Partner: No   Emotionally Abused: No   Physically Abused: No   Sexually Abused: No    Family History  Problem Relation Age of Onset   Diabetes Mother    Hyperlipidemia Mother    Hypertension Mother    Diabetes Father    Hyperlipidemia Father    Hypertension Father    Obesity Father    Hypertension Sister    Hyperlipidemia Sister    Hyperlipidemia Sister    Breast cancer Neg Hx       Review of systems complete and found to be negative unless listed above      PHYSICAL EXAM  Vitals:   07/18/21 1500 07/18/21 1600  BP:  133/60  Pulse: 79   Resp: 16 14  Temp:  98 F (36.7 C)  SpO2: 98%      General: lethargic. Does not respond to voice.  HEENT:  Normocephalic and atramatic Neck:  No JVD.  Lungs: Clear bilaterally to auscultation and percussion. Heart: HRRR . Normal S1 and S2 without gallops or murmurs.  Abdomen: Bowel sounds are positive, abdomen soft and non-tender  Msk:  Back normal, normal gait. Normal strength and tone for age. Extremities: Chronic venous stasis changes.  Neuro: Alert and oriented X 3. Psych:  Good affect, responds appropriately  Labs:   Lab Results  Component Value Date   WBC 8.8 07/18/2021   HGB 11.4 (L) 07/18/2021   HCT 34.8 (L) 07/18/2021   MCV 95.1 07/18/2021   PLT 116 (L) 07/18/2021     Recent Labs  Lab 07/17/21 0420 07/18/21 0559  NA 136 135  K 4.7 3.9  CL 109 108  CO2 21* 20*  BUN 40* 38*  CREATININE 2.24* 1.43*  CALCIUM 8.3* 8.4*  PROT 6.5  --   BILITOT 1.1  --   ALKPHOS 100  --   ALT 29  --   AST 22  --   GLUCOSE 114* 110*    Lab Results  Component Value Date   CKTOTAL 133 07/18/2021   TROPONINI < 0.02 05/22/2012     Lab Results  Component Value Date   CHOL 157 02/04/2021   CHOL 152 01/27/2020   CHOL 148 11/25/2018   Lab Results  Component Value Date   HDL 73 02/04/2021   HDL 69 01/27/2020   HDL 73 11/25/2018   Lab Results  Component Value Date   LDLCALC 69 02/04/2021   LDLCALC 64 01/27/2020   LDLCALC 62 11/25/2018   Lab Results  Component Value Date   TRIG 71 02/04/2021   TRIG 103 01/27/2020   TRIG 51 11/25/2018   Lab Results  Component Value Date   CHOLHDL 2.2 02/04/2021   CHOLHDL 2.2 01/27/2020   CHOLHDL 2.0 11/25/2018   No results found for: LDLDIRECT    Radiology: CT ABDOMEN PELVIS WO CONTRAST  Result Date: 07/16/2021 CLINICAL DATA:  Abdominal pain and fever. EXAM: CT ABDOMEN AND PELVIS WITHOUT CONTRAST TECHNIQUE: Multidetector CT imaging of the abdomen and pelvis was performed following the standard protocol without IV contrast. COMPARISON:  Pelvic CT dated 01/09/2020 and CT abdomen pelvis dated 11/01/2019. FINDINGS: Evaluation of this exam is limited in the absence of intravenous contrast. Lower chest: Bibasilar atelectasis/scarring. Coronary vascular calcification. No intra-abdominal free air or free fluid. Hepatobiliary: The liver is unremarkable. No intrahepatic biliary dilatation. Layering high attenuating content within the gallbladder may represent sludge or small stones. No  pericholecystic fluid or evidence of acute cholecystitis by CT. Pancreas: Unremarkable. No pancreatic ductal dilatation or surrounding inflammatory changes. Spleen: Normal in size without focal abnormality. Adrenals/Urinary Tract: The adrenal glands  are unremarkable. There is no hydronephrosis or nephrolithiasis on either side. A 1 cm right renal upper pole hypodense lesion is suboptimally characterized. The visualized ureters appear unremarkable. The urinary bladder is poorly visualized and obscured by streak artifact caused by left hip arthroplasty. Stomach/Bowel: Moderate stool throughout the colon. There is a 2 cm duodenal diverticulum without active inflammatory changes. There is no bowel obstruction or active inflammation. The appendix is normal. Vascular/Lymphatic: Moderate aortoiliac atherosclerotic disease. An infrarenal IVC filter noted. There is no adenopathy. Reproductive: The uterus is not visualized. Other: None Musculoskeletal: Degenerative changes of the spine and scoliosis. Severe right hip arthritic changes with protrusio acetabula and fragmentation of the acetabulum and medial acetabular wall. Total left hip arthroplasty. No acute osseous pathology. Right hip effusion. IMPRESSION: 1. No acute intra-abdominal or pelvic pathology. No hydronephrosis or nephrolithiasis. 2. Moderate colonic stool burden. No bowel obstruction. Normal appendix. 3. Severe right hip arthritic changes with protrusion acetabula and fragmentation of the acetabulum and medial acetabular wall. Right hip effusion. 4. Aortic Atherosclerosis (ICD10-I70.0). Electronically Signed   By: Anner Crete M.D.   On: 07/16/2021 00:58   DG Chest 1 View  Result Date: 07/15/2021 CLINICAL DATA:  Pt reports she is only here to "get checked out to see if have COVID or flu". States she has been feeling weak for the past few days but feels better today. Reports decreased eating, drinking, urine output. Last urine output this am. EXAM: CHEST  1 VIEW COMPARISON:  Chest x-ray 02/23/2021, CT chest 02/23/2021 FINDINGS: The heart and mediastinal contours are unchanged. Low lung volumes. No focal consolidation. No pulmonary edema. Trace pleural effusions not excluded. No pneumothorax. No  acute osseous abnormality. Severe degenerative changes of the shoulders bilaterally. IMPRESSION: Low lung volumes.  Trace pleural effusions not excluded. Electronically Signed   By: Iven Finn M.D.   On: 07/15/2021 17:30   DG FLUORO GUIDED NEEDLE PLC ASPIRATION/INJECTION LOC  Result Date: 07/17/2021 CLINICAL DATA:  Patient with a fever and concern for sepsis with complaints of right hip pain with right hip effusion seen on CT imaging. Request received for right hip joint aspiration. EXAM: EXAM RIGHT HIP ASPIRATION UNDER FLUOROSCOPY FLUOROSCOPY TIME:  0.2 MINUTE, 3.1 MGY, 2 IMAGES. TECHNIQUE: Risks and benefits of the procedure were discussed with the patient and her husband today including but not limiting to bleeding, infection, damage to adjacent structures, low yield of diagnostic studies, unable to obtain fluid and the use of fluoroscopy. All questions were answered and informed written consent was obtained. An appropriate skin entry site was determined under fluoroscopy. Skin site was marked, prepped with Betadine, and draped in usual sterile fashion, and infiltrated locally with 1% lidocaine. A 20-gauge needle was advanced to the right femoroacetabular joint space under fluoroscopy, 7 mL's of serosanguineous fluid was successfully aspirated. Following the joint aspiration, intra-articular location was confirmed with injection of 2 mL of sterile iodinated contrast. All fluid was sent to the lab for analysis. The needle was removed in its entirety and a sterile bandage was applied. No immediate complications. COMPLICATIONS: COMPLICATIONS none IMPRESSION: 1. Technically successful fluoroscopic guided right femoroacetabular joint space aspiration yielding 7 mL of serosanguineous fluid. Read By: [name] PA-C Electronically Signed   By: Kathreen Devoid M.D.   On: 07/17/2021 16:24    EKG: SVT  at rate of 160 bpm  Echocardiogram 2D complete: (09/07/2020) IMPRESSIONS   1. Left ventricular ejection  fraction, by estimation, is 60 to 65%. The left ventricle has normal function. The left ventricle has no regional wall motion abnormalities. Left ventricular diastolic parameters were normal.   2. Right ventricular systolic function is normal. The right ventricular size is normal.   3. The mitral valve is normal in structure. Trivial mitral valve regurgitation.   4. The aortic valve is normal in structure. Aortic valve regurgitation is not visualized.   ASSESSMENT AND PLAN:  Anyra Kaufman is a 75 year old female with a history of type 2 diabetes, hypertension, hyperlipidemia, obesity, OSA, SVT, paroxysmal A. fib, chronic DVT of the right lower extremity status post IVC filter who was admitted with septic shock of unknown source.  Cardiology is consulted for SVT at a rate of 160 bpm.  # SVT #Multisystem organ failure She has a previous history of SVT, though in the past there has been some consideration that this may in fact be atrial flutter.  She developed SVT with a rate of 160 in the setting of multisystem organ failure which resolved with 6 mg of IV adenosine.  Per report there were no apparent flutter waves prior to cardioversion. -Adenosine as needed for recurrent SVT -Resume metoprolol today -On amiodarone now for AF  # Atrial fibrillation/ flutter Developed on AM of 11/2, now back in NSR after amiodarone given. Some question previously about whether her SVT was in fact flutter.  - [place] 2 Vasc = 4 (2 age, DM, female). Need to discuss outpatient anticoagulation for stroke risk reduction. May consider starting tomorrow. - Continue amiodarone  400 mg BID x 7 days. Then 200 mg thereafter - Resume home metoprolol today  Signed: Andrez Grime MD 07/18/2021, 5:15 PM

## 2021-07-18 NOTE — Progress Notes (Signed)
Date of Admission:  07/16/2021    ID: Becky Trevino is a 75 y.o. female Active Problems:   Septic shock ([place])   Right hip joint effusion   Leukocytosis    Subjective: Pt is feeling much better Back to baseline No epistaxis Waiting to get out of ICU  Medications:   amiodarone  400 mg Oral BID   aspirin EC  81 mg Oral Daily   atorvastatin  40 mg Oral QPM   brinzolamide  1 drop Both Eyes BID   And   brimonidine  1 drop Both Eyes BID   calcium-vitamin D  1 tablet Oral BID   Chlorhexidine Gluconate Cloth  6 each Topical Daily   fluticasone  1 spray Each Nare Daily   heparin  5,000 Units Subcutaneous Q8H   insulin aspart  0-9 Units Subcutaneous Q4H   latanoprost  1 drop Both Eyes QHS   levothyroxine  25 mcg Oral Q0600   magnesium oxide  400 mg Oral Daily   metoprolol succinate  25 mg Oral Daily   mirabegron ER  50 mg Oral Daily   mupirocin ointment   Nasal BID   potassium chloride  10 mEq Oral Daily   sodium chloride  1 spray Each Nare TID    Objective: Vital signs in last 24 hours: Temp:  [97.7 F (36.5 C)-98.4 F (36.9 C)] 97.7 F (36.5 C) (11/03 2000) Pulse Rate:  [75-85] 79 (11/03 1500) Resp:  [12-19] 14 (11/03 1600) BP: (115-133)/(55-80) 133/60 (11/03 1600) SpO2:  [96 %-98 %] 98 % (11/03 1500) Weight:  [127 kg] 127 kg (11/03 0406)  PHYSICAL EXAM:  General: Alert, cooperative, no distress, appears stated age.  Head: Normocephalic, without obvious abnormality, atraumatic. Eyes: Conjunctivae clear, anicteric sclerae. Pupils are equal ENT Nares normal. No drainage or sinus tenderness. Lips, mucosa, and tongue normal. No Thrush Neck: Supple, symmetrical, no adenopathy, thyroid: non tender no carotid bruit and no JVD. Back: No CVA tenderness. Lungs: Clear to auscultation bilaterally. No Wheezing or Rhonchi. No rales. Heart: Regular rate and rhythm, no murmur, rub or gallop. Abdomen: Soft, non-tender,not distended. Bowel sounds normal. No masses Extremities:  edema legs B/l knee surgical scar Moving limbs  Skin: No rashes or lesions. Or bruising Lymph: Cervical, supraclavicular normal. Neurologic: Grossly non-focal  Lab Results Recent Labs    07/17/21 0420 07/18/21 0559  WBC 11.3* 8.8  HGB 12.9 11.4*  HCT 38.3 34.8*  NA 136 135  K 4.7 3.9  CL 109 108  CO2 21* 20*  BUN 40* 38*  CREATININE 2.24* 1.43*   Liver Panel Recent Labs    07/17/21 0420  PROT 6.5  ALBUMIN 2.7*  AST 22  ALT 29  ALKPHOS 100  BILITOT 1.1  BILIDIR 0.3*  IBILI 0.8   Sedimentation Rate Recent Labs    07/18/21 0559  ESRSEDRATE 49*   C-Reactive Protein Recent Labs    07/18/21 0559  CRP 11.2*    Microbiology:  Studies/Results: DG FLUORO GUIDED NEEDLE PLC ASPIRATION/INJECTION LOC  Result Date: 07/17/2021 CLINICAL DATA:  Patient with a fever and concern for sepsis with complaints of right hip pain with right hip effusion seen on CT imaging. Request received for right hip joint aspiration. EXAM: EXAM RIGHT HIP ASPIRATION UNDER FLUOROSCOPY FLUOROSCOPY TIME:  0.2 MINUTE, 3.1 MGY, 2 IMAGES. TECHNIQUE: Risks and benefits of the procedure were discussed with the patient and her husband today including but not limiting to bleeding, infection, damage to adjacent structures, low yield of diagnostic  studies, unable to obtain fluid and the use of fluoroscopy. All questions were answered and informed written consent was obtained. An appropriate skin entry site was determined under fluoroscopy. Skin site was marked, prepped with Betadine, and draped in usual sterile fashion, and infiltrated locally with 1% lidocaine. A 20-gauge needle was advanced to the right femoroacetabular joint space under fluoroscopy, 7 mL's of serosanguineous fluid was successfully aspirated. Following the joint aspiration, intra-articular location was confirmed with injection of 2 mL of sterile iodinated contrast. All fluid was sent to the lab for analysis. The needle was removed in its entirety  and a sterile bandage was applied. No immediate complications. COMPLICATIONS: COMPLICATIONS none IMPRESSION: 1. Technically successful fluoroscopic guided right femoroacetabular joint space aspiration yielding 7 mL of serosanguineous fluid. Read By: [name] PA-C Electronically Signed   By: Kathreen Devoid M.D.   On: 07/17/2021 16:24     Assessment/Plan: ?pt presenting with  sepsis like picture with preceding epistaxis for which she had to get nasal packing t no clear source identified No pneumonia, no cellulitis, no Uti Chronic rt hip arthritis-pain- 7cc aspirated and cell count wa sintially reported as  73K - (37 L, 58M and 41% N) but repeat was 1300 and then 900 So this is not septic arthritis  No left hip prosthetic joint infection Discontinue dapto and cefepime and change to unasyn    Recent epistaxis and had nasal packing which was removed after admission- will do unasyn in case it was related to the packing to cover strep , anerobes      AKI- creatinine was worsening and vanco Dced yesterday- cr has improved   H/o Rt DVT- has IVC filter B/l lymphedema legs   H/o complicated infections 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

## 2021-07-18 NOTE — Progress Notes (Signed)
NAME:  Becky Trevino, MRN:  [id number], DOB:  [birth year], LOS: 2 ADMISSION DATE:  07/16/2021, CONSULTATION DATE:  07/16/2021 REFERRING MD:  Dr. Starleen Blue, CHIEF COMPLAINT:  Hypotension   Brief Pt Description / Synopsis:  75 y.o. Female admitted with Septic shock of unknown etiology, suspect possible right hip septic arthritis/joint infection.  History of Present Illness:  75 y.o with significant PMH as below who presented to the ED with chief complaints of hypotension and decreased urine output   Patient report onset of symptoms since several days ago associated with decreased appetite, generalized weakness and nonproductive cough.  Denies abdominal pain, nausea vomiting, diarrhea, fevers or chills.  Patient endorses right hip pain and difficulty walking or weightbearing.   ED Course: On arrival to the ED,  she was afebrile with blood pressure  71/41 mm Hg and pulse rate 133 beats/min, respirations 14 breaths/min and oxygen saturation 92% on room air there were no focal neurological deficits;  Pertinent Labs in Red/Diagnostics Findings: Na+/ K+: 137/4.5 Glucose: 106 BUN/Cr.: 32/3.11 Calcium: 9.0 AST/ALT:   WBC/ TMAX: 13.4/ afebrile Hgb/Hct: 12.9/41.1 Plts: 157 PCT: negative <0.10 Lactic acid: 2.3 COVID PCR: Negative   Troponin: 15 BNP: 78.7 Arterial Blood Gas result:  pO2 70; pCO2 35; pH 7.34;  HCO3 18.9, %O2 Sat 92.7.   EKG: normal sinus rhythm, unchanged from previous tracings. Imaging : CXR:Low lung volumes. Trace pleural effusions not excluded. CT Abd/pel : Severe right hip arthritic changes with protrusion acetabula and fragmentation of the acetabulum and medial acetabular wall. Right hip effusion.   Due to concerns for sepsis, code sepsis was initiated and patient started on broad-spectrum antibiotics following her IV fluid resuscitation.  Patient remained hypotensive despite IV fluid resuscitation therefore was started on Levophed to keep MAP >65 mm Hg.  PCCM  consulted  Pertinent  Medical History   Atherosclerosis of aorta ([place]) 06/07/2021  Anasarca    Hypothyroidism    SVT (supraventricular tachycardia) (HCC)    Acute kidney injury superimposed on CKD (HCC)    Hypotension    Class 2 obesity without serious comorbidity with body mass index (BMI) of 39.0 to 39.9 in adult    Thrombocytopenia (HCC)    Mild protein-calorie malnutrition (HCC)    Hyperbilirubinemia    Injury of left femoral nerve 08/28/2020  History of DVT (deep vein thrombosis) 06/25/2020  Lymphedema 06/25/2020  History of pelvic hematoma 02/10/2020  Foot ulcer, right, limited to breakdown of skin ([place]) 12/02/2019  MRSA bacteremia 10/30/2019  Chronic venous hypertension due to deep vein thrombosis (DVT) 10/30/2019  Hematoma    Normocytic anemia 08/07/2019  Chronic infection of right knee ([place]) 11/25/2018  Infection and inflammatory reaction due to internal joint prosthesis, subsequent encounter 01/25/2018  Infection of total right knee replacement ([place]) 11/19/2017  Type 2 diabetes mellitus with hyperlipidemia ([place]) 11/06/2016  Primary osteoarthritis of knee 03/11/2016  Primary osteoarthritis of left hip 12/11/2015  Benign essential HTN 04/21/2015  Edema leg 04/21/2015  Chronic kidney disease (CKD), stage III (moderate) ([place]) 04/21/2015  Chronic venous insufficiency 04/21/2015  DDD (degenerative disc disease), cervical 04/21/2015  Diabetes mellitus with renal manifestation ([place]) 04/21/2015  Hyperlipidemia 04/21/2015  Bladder cystocele 04/21/2015  Urinary incontinence 04/21/2015  Leg varices 04/21/2015  Insomnia 04/21/2015  Eczema intertrigo 04/21/2015  Obesity, Class III, BMI 40-49.9 (morbid obesity) ([place]) 04/21/2015  OSA (obstructive sleep apnea) 04/21/2015  Menopausal and perimenopausal disorder 04/21/2015  Supraventricular premature beats 04/21/2015  Osteoarthritis of multiple joints 04/21/2015  H/O Spinal surgery 11/14/2013  Detrusor muscle  hypertonia  11/07/2013    Micro Data:  10/31: SARS-CoV-2 PCR> negative 10/31: Influenza PCR> negative 10/31: Blood culture x2> 10/31: Urine Culture> 10/31: MRSA PCR>>   Antimicrobials:  Vancomycin 10/31>11/2 Cefepime 10/31> Daptomycin 11/2>>  Consults:  PCCM ID Orthopedics ENT Vascular Surgery Cardiology   Procedures:  11/2: fluoroscopic guided right hip joint aspiration   Significant Diagnostic Tests:  10/31: Chest Xray> no acute cardiopulmonary process 11/1: CTA abdomen and pelvis >Severe right hip arthritic changes with protrusion acetabula and fragmentation of the acetabulum and medial acetabular wall. Right hip effusion.  Significant Hospital Events: Including procedures, antibiotic start and stop dates in addition to other pertinent events   11/1: Admitted to ICU with sepsis with septic shock of unknown source. ID & Ortho consulted 11/2: Episode of SVT, received Adenosine x2 and Metoprolol ~ plan to start Amiodarone per Cardiology recommendations. Remains on low dose pressors (30 mcg Neo), plan for IR guided right hip aspiration 11/3: Vasopressors weaned off, Preliminary results from R hip aspiration from yesterday with high WBC count (lower neutrophilic count) ~ cultures pending  Interim History / Subjective:  -No significant events noted overnight -Afebrile, hemodynamically stable ~ weaned off Vasopressors -Underwent fluoroscopic guided right hip joint aspiration yesterday -Preliminary results with cell count 73K (37 L, 22 M, 41% N) ~ cultures pending -Daptomycin added yesterday by ID -Leukocytosis continues to improve, 8.8 K today (11.3), PCT also improved to 0.44 (1.28) -Creatinine improving to 1.43 (2.24), UOP 600 cc yesterday (net + 2.7 L since admit) -Pt reports abdominal pain with coughing, requests antitussive -Denies chest pain, palpitations, dizziness, SOB, productive cough, N/V/D, fever, chills, dysuria  Objective   Blood pressure 126/69, pulse 75, temperature  97.7 F (36.5 C), temperature source Oral, resp. rate 17, height 5\' 11"  (1.803 m), weight 127 kg, SpO2 97 %.        Intake/Output Summary (Last 24 hours) at 07/18/2021 0748 Last data filed at 07/18/2021 0500 Gross per 24 hour  Intake --  Output 400 ml  Net -400 ml    Filed Weights   07/16/21 0807 07/17/21 0500 07/18/21 0406  Weight: 122 kg 126.5 kg 127 kg    Examination: General: Acutely ill appearing female, laying in bed, on RA, in NAD HENT: Atraumatic, normocephalic, neck supple, no JVD Lungs: Clear to auscultation bilaterally, even, nonlabored, no accessory muscle use Cardiovascular: RRR (NSR on telemetry), no M/R/G Abdomen: Obese, soft, nontender, nondistended, no guarding or rebound tenderness, BS+ x4 Extremities: Bilateral LE with chronic venous stasis changes.  Limited ROM in right hip Neuro: Awake and alert, oriented to person, place, situation.  Follows commands, no focal deficits, speech clear, pupils PERRL GU: External female catheter in place draining yellow urine  Resolved Hospital Problem list     Assessment & Plan:   Septic Shock ~ resolved Tachyarrhythmias: SVT Chronic Anasarca/Lymphedema with Chronic Diastolic CHF Hx: HTN, CAD status post CABG in March 2021, SVT, HLD  -Continuous cardiac monitoring -Maintain MAP >65 -Vasopressors as needed to maintain MAP goal ~ currently weaned off -Continue Midodrine -Wean Solu-cortef (decreased to 50 mg q12 on 11/3) -Lactic acid normalized -HS Troponin peaked at 21 -Consider repeat Echocardiogram ~ Echo from 09/07/20 with LVEF 76-16%, normal diastolic parameters, RV function normal -Cardiology following, appreciate input -Amiodarone as per Cardiology -Diuresis as BP and renal function permits -Currently holding home Torsemide & Metoprolol due to need for Vasopressors ~can resume once shock resolved  Severe Sepsis due to suspected septic arthritis of right hip? PMHx: Group B streptococcus  bacteremia, Left THA,  Right TKA with prosthetic joint infection  Lactic: 2.3,  Urine culture negative, CXR: No acute cardiopulmonary process, CT: shows severe right hip effusion but no other acute abdominal process  -Monitor fever curve -Trend WBC's & Procalcitonin -Follow cultures as above -ID following, appreciate input ~ will defer ABX to ID -Continue empiric Cefepime & Daptomycin pending cultures & sensitivities -Underwent Fluoroscopic guided right hip joint aspiration 07/17/21 ~ cultures pending  AKI on CKD Stage III ~ improving -Monitor I&O's / urinary output -Follow BMP -Ensure adequate renal perfusion -Avoid nephrotoxic agents as able -Replace electrolytes as indicated  Mild Thrombocytopenia, suspect in setting of sepsis -Monitor for S/Sx of bleeding -Trend CBC -Heparin SQ for VTE Prophylaxis  -Transfuse for Hgb <7   Diabetes mellitus -CBG's q4h; Target range of 140 to 180 -SSI -Follow ICU Hypo/Hyperglycemia protocol -Hold home Meds   Hypothyroidism -Continue Synthroid   OSA not on CPAP -Supplemental O2 as needed to maintain O2 sats >90% -Follow intermittent CXR & ABG  -Ensure pulmonary hygiene  Best Practice (right click and "Reselect all SmartList Selections" daily)   Diet/type: Regular DVT prophylaxis: prophylactic heparin  GI prophylaxis: N/A Lines: N/A Foley:  N/A Code Status:  full code Last date of multidisciplinary goals of care discussion [07/18/2021]  Labs   CBC: Recent Labs  Lab 07/15/21 1051 07/16/21 0633 07/17/21 0420 07/18/21 0559  WBC 13.4* 13.5* 11.3* 8.8  NEUTROABS  --   --  10.1* 7.4  HGB 12.9 13.4 12.9 11.4*  HCT 41.1 40.4 38.3 34.8*  MCV 97.4 96.9 96.2 95.1  PLT 157 150 125* 116*     Basic Metabolic Panel: Recent Labs  Lab 07/15/21 1051 07/16/21 0633 07/17/21 0420 07/18/21 0559  NA 137  --  136 135  K 4.5  --  4.7 3.9  CL 105  --  109 108  CO2 22  --  21* 20*  GLUCOSE 106*  --  114* 110*  BUN 32*  --  40* 38*  CREATININE 3.11* 3.47*  2.24* 1.43*  CALCIUM 9.0  --  8.3* 8.4*  MG  --  2.3 2.2 2.7*  PHOS  --  3.3 3.1 2.6    GFR: Estimated Creatinine Clearance: 50.1 mL/min (A) (by C-G formula based on SCr of 1.43 mg/dL (H)). Recent Labs  Lab 07/15/21 1051 07/15/21 1808 07/15/21 2019 07/16/21 0113 07/16/21 0633 07/17/21 0420 07/18/21 0559  PROCALCITON  --   --   --   --  1.28  --  0.44  WBC 13.4*  --   --   --  13.5* 11.3* 8.8  LATICACIDVEN  --  2.3* 2.2* 3.3* 2.1*  --   --      Liver Function Tests: Recent Labs  Lab 07/15/21 1808 07/17/21 0420  AST 46* 22  ALT 41 29  ALKPHOS 118 100  BILITOT 2.0* 1.1  PROT 7.1 6.5  ALBUMIN 3.3* 2.7*    No results for input(s): LIPASE, AMYLASE in the last 168 hours. No results for input(s): AMMONIA in the last 168 hours.  ABG    Component Value Date/Time   PHART 7.34 (L) 07/16/2021 0421   PCO2ART 35 07/16/2021 0421   PO2ART 70 (L) 07/16/2021 0421   HCO3 18.9 (L) 07/16/2021 0421   TCO2 25 09/02/2019 0813   ACIDBASEDEF 6.1 (H) 07/16/2021 0421   O2SAT 92.7 07/16/2021 0421      Coagulation Profile: Recent Labs  Lab 07/15/21 1808  INR 1.2     Cardiac  Enzymes: Recent Labs  Lab 07/17/21 2126 07/18/21 0559  CKTOTAL 184 133    HbA1C: Hemoglobin A1C  Date/Time Value Ref Range Status  06/07/2021 10:12 AM 5.5 4.0 - 5.6 % Corrected  02/04/2021 10:52 AM 5.3 4.0 - 5.6 % Final   HbA1c, POC (controlled diabetic range)  Date/Time Value Ref Range Status  05/12/2019 10:33 AM 6.0 0.0 - 7.0 % Final  07/27/2018 08:43 AM 5.7 0.0 - 7.0 % Final   Hgb A1c MFr Bld  Date/Time Value Ref Range Status  10/28/2019 06:23 AM 5.5 4.8 - 5.6 % Final    Comment:    (NOTE) Pre diabetes:          5.7%-6.4% Diabetes:              >6.4% Glycemic control for   <7.0% adults with diabetes     CBG: Recent Labs  Lab 07/17/21 1636 07/17/21 2007 07/18/21 0027 07/18/21 0400 07/18/21 0729  GLUCAP 152* 102* 123* 101* 102*     Review of Systems:   Positives in BOLD: Pt  denies all complaints Gen: Denies fever, chills, weight change, fatigue, night sweats HEENT: Denies blurred vision, double vision, hearing loss, tinnitus, sinus congestion, rhinorrhea, sore throat, neck stiffness, dysphagia PULM: Denies shortness of breath, cough, sputum production, hemoptysis, wheezing CV: Denies chest pain, edema, orthopnea, paroxysmal nocturnal dyspnea, palpitations GI: Denies abdominal pain, nausea, vomiting, diarrhea, hematochezia, melena, constipation, change in bowel habits GU: Denies dysuria, hematuria, polyuria, oliguria, urethral discharge Endocrine: Denies hot or cold intolerance, polyuria, polyphagia or appetite change Derm: Denies rash, dry skin, scaling or peeling skin change Heme: Denies easy bruising, bleeding, bleeding gums Neuro: Denies headache, numbness, weakness, slurred speech, loss of memory or consciousness   Past Medical History:  She,  has a past medical history of Cervical spondylosis, Chronic kidney disease, DDD (degenerative disc disease), cervical, Diabetes mellitus without complication ([place]), Diverticulosis, Hyperlipidemia, Hypertension, Intertrigo, Leg cramps, Leg varices, Obesity, OSA (obstructive sleep apnea), Symptomatic menopausal or female climacteric states, and Syncope.   Surgical History:   Past Surgical History:  Procedure Laterality Date   ABDOMINAL HYSTERECTOMY  1993   Total   BUNIONECTOMY Bilateral 1993   I & D KNEE WITH POLY EXCHANGE Right 11/19/2017   Procedure: RIGHT KNEE POLY EXCHANGE WITH IRRIGATION AND DEBRIDEMENT;  Surgeon: Hessie Knows, MD;  Location: [facility];  Service: Orthopedics;  Laterality: Right;   JOINT REPLACEMENT     bilateral knee   LAMINECTOMY  11/14/2013    Cervical Fusion , [facility], Dr. Delilah Shan   LASER ABLATION Bilateral 07/29/2012   Dr. Lucky Cowboy   LOWER EXTREMITY ANGIOGRAPHY Right 11/02/2019   Procedure: Lower Extremity Angiography;  Surgeon: Katha Cabal, MD;  Location: [facility];  Service:  Cardiovascular;  Laterality: Right;   ORIF ANKLE FRACTURE Right 09/02/2019   Procedure: OPEN REDUCTION INTERNAL FIXATION (ORIF) ANKLE FRACTURE BIMALLEOLAR;  Surgeon: [name], DPM;  Location: [facility];  Service: Podiatry;  Laterality: Right;   PERIPHERAL VASCULAR THROMBECTOMY Right 03/02/2020   Procedure: PERIPHERAL VASCULAR THROMBECTOMY;  Surgeon: Algernon Huxley, MD;  Location: [facility];  Service: Cardiovascular;  Laterality: Right;   REPLACEMENT TOTAL KNEE Left 03/2010   Dr. Rudene Christians   TOTAL HIP ARTHROPLASTY Left 12/11/2015   Procedure: TOTAL HIP ARTHROPLASTY ANTERIOR APPROACH;  Surgeon: Hessie Knows, MD;  Location: [facility];  Service: Orthopedics;  Laterality: Left;   TOTAL KNEE ARTHROPLASTY Right 03/11/2016   Procedure: TOTAL KNEE ARTHROPLASTY;  Surgeon: Hessie Knows, MD;  Location:  [facility];  Service: Orthopedics;  Laterality: Right;     Social History:   reports that she has never smoked. She has never used smokeless tobacco. She reports that she does not drink alcohol and does not use drugs.   Family History:  Her family history includes Diabetes in her father and mother; Hyperlipidemia in her father, mother, sister, and sister; Hypertension in her father, mother, and sister; Obesity in her father. There is no history of Breast cancer.   Allergies Allergies  Allergen Reactions   Lisinopril Swelling     Home Medications  Prior to Admission medications   Medication Sig Start Date End Date Taking? Authorizing Provider  ASPIRIN LOW DOSE 81 MG EC tablet TAKE 1 TABLET BY MOUTH EVERY DAY 01/29/21  Yes Sowles, Drue Stager, MD  Calcium Carbonate-Vitamin D 600-400 MG-UNIT tablet Take 1 tablet by mouth 2 (two) times daily. 11/14/20  Yes Sowles, Drue Stager, MD  cephALEXin (KEFLEX) 500 MG capsule Take 500 mg by mouth 3 (three) times daily. 07/11/21  Yes [provider]  doxycycline (VIBRAMYCIN) 100 MG capsule Take 100 mg by mouth 2 (two) times daily. 12/14/20  Yes [provider]  levothyroxine (SYNTHROID) 25 MCG tablet TAKE 1 TABLET (25 MCG TOTAL) BY MOUTH DAILY AT 6 (SIX) AM. 05/14/21 07/16/21 Yes Sowles, Drue Stager, MD  Magnesium Oxide 500 MG CAPS Take 500 mg by mouth daily.  11/10/07  Yes [provider]  metoprolol succinate (TOPROL-XL) 25 MG 24 hr tablet TAKE 1 TABLET (25 MG TOTAL) BY MOUTH DAILY. 07/01/21  Yes Sowles, Drue Stager, MD  mirabegron ER (MYRBETRIQ) 50 MG TB24 tablet Take 1 tablet (50 mg total) by mouth daily. 06/28/21  Yes [name], Larene Beach A, PA-C  oxyCODONE (OXY IR/ROXICODONE) 5 MG immediate release tablet Take 5 mg by mouth every 4 (four) hours as needed. 02/18/21  Yes Hessie Knows, MD  potassium chloride (KLOR-CON) 10 MEQ tablet Take 10 mEq by mouth daily. 08/18/19  Yes Callwood, Dwayne D, MD  SIMBRINZA 1-0.2 % SUSP Place 1 drop into both eyes 2 (two) times daily. 10/13/19  Yes [provider]  acetaminophen (TYLENOL) 650 MG CR tablet Take 650-1,300 mg by mouth 2 (two) times daily as needed for pain.    [provider]  atorvastatin (LIPITOR) 40 MG tablet Take 1 tablet (40 mg total) by mouth every evening. 06/07/21   Ancil Boozer, Drue Stager, MD  fluticasone [facility]) 50 MCG/ACT nasal spray SPRAY 2 SPRAYS INTO EACH NOSTRIL EVERY DAY 05/14/21   Ancil Boozer, Drue Stager, MD  ketoconazole (NIZORAL) 2 % cream Apply 1 application topically 2 (two) times daily as needed (rash).  01/26/15   [provider]  latanoprost (XALATAN) 0.005 % ophthalmic solution SMARTSIG:In Eye(s) 07/12/20   [provider]  polyethylene glycol (MIRALAX / GLYCOLAX) 17 g packet Take 17 g by mouth daily. 09/15/20   Antonieta Pert, MD  torsemide (DEMADEX) 20 MG tablet Take 1 tablet (20 mg total) by mouth daily. 02/04/21 03/06/21  [name], MD  traZODone (DESYREL) 50 MG tablet Take 0.5-1 tablets (25-50 mg total) by mouth at bedtime as needed for sleep. 02/26/21   [name], MD     Critical care time: 38 minutes     Darel Hong, AGACNP-BC [facility] Prefer epic messenger for cross cover needs If after hours, please call E-link

## 2021-07-18 NOTE — Progress Notes (Signed)
Becky Trevino at [place] NAME: Becky Trevino    MR#:  [id number]  DATE OF BIRTH:  1946/02/04  SUBJECTIVE:   no fever. Overall feels a lot better. Had right hip joint aspirated yesterday. No chest pain. Remains in sinus rhythm of amiodarone drip. REVIEW OF SYSTEMS:   Review of Systems  Constitutional:  Negative for chills, fever and weight loss.  HENT:  Negative for ear discharge, ear pain and nosebleeds.   Eyes:  Negative for blurred vision, pain and discharge.  Respiratory:  Negative for sputum production, shortness of breath, wheezing and stridor.   Cardiovascular:  Negative for chest pain, palpitations, orthopnea and PND.  Gastrointestinal:  Negative for abdominal pain, diarrhea, nausea and vomiting.  Genitourinary:  Negative for frequency and urgency.  Musculoskeletal:  Negative for back pain and joint pain.  Neurological:  Positive for weakness. Negative for sensory change, speech change and focal weakness.  Psychiatric/Behavioral:  Negative for depression and hallucinations. The patient is not nervous/anxious.   Tolerating Diet:yes Tolerating PT:   DRUG ALLERGIES:   Allergies  Allergen Reactions  . Lisinopril Swelling    VITALS:  Blood pressure 129/80, pulse 79, temperature 98.4 F (36.9 C), temperature source Axillary, resp. rate 16, height 5\' 11"  (1.803 m), weight 127 kg, SpO2 98 %.  PHYSICAL EXAMINATION:   Physical Exam  GENERAL:  75 y.o.-year-old patient lying in the bed with no acute distress.  LUNGS: Normal breath sounds bilaterally, no wheezing, rales, rhonchi. No use of accessory muscles of respiration.  CARDIOVASCULAR: S1, S2 normal. No murmurs, rubs, or gallops.  ABDOMEN: Soft, nontender, nondistended. Bowel sounds present. No organomegaly or mass.  EXTREMITIES: No cyanosis, clubbing or edema b/l.   Mild ROM right hip NEUROLOGIC: Cranial nerves II through XII are intact. No focal Motor or sensory deficits b/l.    PSYCHIATRIC:  patient is alert and oriented x 3.  SKIN: No obvious rash, lesion, or ulcer.   LABORATORY PANEL:  CBC Recent Labs  Lab 07/18/21 0559  WBC 8.8  HGB 11.4*  HCT 34.8*  PLT 116*    Chemistries  Recent Labs  Lab 07/17/21 0420 07/18/21 0559  NA 136 135  K 4.7 3.9  CL 109 108  CO2 21* 20*  GLUCOSE 114* 110*  BUN 40* 38*  CREATININE 2.24* 1.43*  CALCIUM 8.3* 8.4*  MG 2.2 2.7*  AST 22  --   ALT 29  --   ALKPHOS 100  --   BILITOT 1.1  --    Cardiac Enzymes No results for input(s): TROPONINI in the last 168 hours. RADIOLOGY:  DG FLUORO GUIDED NEEDLE PLC ASPIRATION/INJECTION LOC  Result Date: 07/17/2021 CLINICAL DATA:  Patient with a fever and concern for sepsis with complaints of right hip pain with right hip effusion seen on CT imaging. Request received for right hip joint aspiration. EXAM: EXAM RIGHT HIP ASPIRATION UNDER FLUOROSCOPY FLUOROSCOPY TIME:  0.2 MINUTE, 3.1 MGY, 2 IMAGES. TECHNIQUE: Risks and benefits of the procedure were discussed with the patient and her husband today including but not limiting to bleeding, infection, damage to adjacent structures, low yield of diagnostic studies, unable to obtain fluid and the use of fluoroscopy. All questions were answered and informed written consent was obtained. An appropriate skin entry site was determined under fluoroscopy. Skin site was marked, prepped with Betadine, and draped in usual sterile fashion, and infiltrated locally with 1% lidocaine. A 20-gauge needle was advanced to the right femoroacetabular joint space under  fluoroscopy, 7 mL's of serosanguineous fluid was successfully aspirated. Following the joint aspiration, intra-articular location was confirmed with injection of 2 mL of sterile iodinated contrast. All fluid was sent to the lab for analysis. The needle was removed in its entirety and a sterile bandage was applied. No immediate complications. COMPLICATIONS: COMPLICATIONS none IMPRESSION: 1.  Technically successful fluoroscopic guided right femoroacetabular joint space aspiration yielding 7 mL of serosanguineous fluid. Read By: [name] PA-C Electronically Signed   By: Kathreen Devoid M.D.   On: 07/17/2021 16:24   ASSESSMENT AND PLAN:   75 y.o with significant PMH hypothyroidism, thrombocytopenia, SVT, M RSA bacteremia, chronic lymphedema, infection of right knee replacement, hypertension, CKD stage III who presented to the ED with chief complaints of hypotension and decreased urine output.  Severe sepsis/septic shock suspected due to questionable right hip joint effusion/? Epistaxis with nasal packing -- patient has PMH of group A streptococcus bacteremia left total hip arthroplasty right total knee arthroplasty with prostatic joint infection -- patient is off IV vasopressors. She received aggressive IV fluids. -- seen by Dr. Roland Rack-- does not appear to be infected right hip. Appears to be chronic DJD. -- Patient underwent 7 mL of seizure centimeters fluid drainage by IR for right hip. Fluid Gram stain so far negative. WBC 73,000 lymphocyte 37, neutrophil 41 -- empirically on IV rapamycin and cefepime per ID dr Delaine Lame -- sepsis resolved. -- Blood culture negative -- afebrile. White count normal  SVT/atrial flutter  -- patient received one dose of IV adenosine and converted to NSR -- patient developed atrial fibrillation flutter. She was placed on IV amiodarone drip. She is now in sinus rhythm. Will switch to amiodarone 400 mg BID for seven days and then 200 mg daily -- cardiology recommends need to discuss outpatient anticoagulation for straight reduction. -- Resume metoprolol now blood pressure is stable  acute on chronic kidney disease stage III B -- creatinine improving -- encourage oral intake -- avoid nephrotoxic agents  type II diabetes with CKD IIIB -- continue sliding scale for now  Hypothyroidism -- continue Synthroid  Bilateral lower extremity chronic  venous stasis with chronic lymphedema -- evaluated by Dr. Lucky Cowboy-- no source of infection in the legs. Appears chronic changes  Hyperlipidemia -- continue statins  seen by palliative care. Appreciate input   Procedures: right hip aspriation 11/1 Family communication :none Consults : ID, ENT, vascular, ortho, cardiology CODE STATUS: full DVT Prophylaxis : Lovenox Level of care: Progressive Cardiac Status is: Inpatient  transfer out of ICU        TOTAL TIME TAKING CARE OF THIS PATIENT: 30 minutes.  >50% time spent on counselling and coordination of care  Note: This dictation was prepared with Dragon dictation along with smaller phrase technology. Any transcriptional errors that result from this process are unintentional.  Fritzi Mandes M.D    [facility]   CC: Primary care physician; [name], MDPatient ID: Becky Trevino, female   DOB: [birth year], 75 y.o.   MRN: [id number]

## 2021-07-19 DIAGNOSIS — R6510 Systemic inflammatory response syndrome (SIRS) of non-infectious origin without acute organ dysfunction: Secondary | ICD-10-CM | POA: Diagnosis not present

## 2021-07-19 DIAGNOSIS — E441 Mild protein-calorie malnutrition: Secondary | ICD-10-CM | POA: Diagnosis not present

## 2021-07-19 DIAGNOSIS — A419 Sepsis, unspecified organism: Secondary | ICD-10-CM | POA: Diagnosis not present

## 2021-07-19 DIAGNOSIS — R6521 Severe sepsis with septic shock: Secondary | ICD-10-CM | POA: Diagnosis not present

## 2021-07-19 LAB — PROCALCITONIN: Procalcitonin: 0.21 ng/mL

## 2021-07-19 LAB — ACID FAST SMEAR (AFB, MYCOBACTERIA): Acid Fast Smear: NEGATIVE

## 2021-07-19 LAB — CBC WITH DIFFERENTIAL/PLATELET
Abs Immature Granulocytes: 0.41 10*3/uL — ABNORMAL HIGH (ref 0.00–0.07)
Basophils Absolute: 0.1 10*3/uL (ref 0.0–0.1)
Basophils Relative: 1 %
Eosinophils Absolute: 1.5 10*3/uL — ABNORMAL HIGH (ref 0.0–0.5)
Eosinophils Relative: 17 %
HCT: 32.7 % — ABNORMAL LOW (ref 36.0–46.0)
Hemoglobin: 10.8 g/dL — ABNORMAL LOW (ref 12.0–15.0)
Immature Granulocytes: 5 %
Lymphocytes Relative: 23 %
Lymphs Abs: 2.1 10*3/uL (ref 0.7–4.0)
MCH: 31.4 pg (ref 26.0–34.0)
MCHC: 33 g/dL (ref 30.0–36.0)
MCV: 95.1 fL (ref 80.0–100.0)
Monocytes Absolute: 0.6 10*3/uL (ref 0.1–1.0)
Monocytes Relative: 6 %
Neutro Abs: 4.2 10*3/uL (ref 1.7–7.7)
Neutrophils Relative %: 48 %
Platelets: 104 10*3/uL — ABNORMAL LOW (ref 150–400)
RBC: 3.44 MIL/uL — ABNORMAL LOW (ref 3.87–5.11)
RDW: 16.3 % — ABNORMAL HIGH (ref 11.5–15.5)
WBC: 8.8 10*3/uL (ref 4.0–10.5)
nRBC: 0 % (ref 0.0–0.2)

## 2021-07-19 LAB — GLUCOSE, CAPILLARY
Glucose-Capillary: 81 mg/dL (ref 70–99)
Glucose-Capillary: 79 mg/dL (ref 70–99)
Glucose-Capillary: 73 mg/dL (ref 70–99)
Glucose-Capillary: 90 mg/dL (ref 70–99)
Glucose-Capillary: 79 mg/dL (ref 70–99)

## 2021-07-19 LAB — BASIC METABOLIC PANEL
Anion gap: 7 (ref 5–15)
BUN: 34 mg/dL — ABNORMAL HIGH (ref 8–23)
CO2: 20 mmol/L — ABNORMAL LOW (ref 22–32)
Calcium: 8.6 mg/dL — ABNORMAL LOW (ref 8.9–10.3)
Chloride: 109 mmol/L (ref 98–111)
Creatinine, Ser: 1.21 mg/dL — ABNORMAL HIGH (ref 0.44–1.00)
GFR, Estimated: 47 mL/min — ABNORMAL LOW (ref >60)
Glucose, Bld: 80 mg/dL (ref 70–99)
Potassium: 3.5 mmol/L (ref 3.5–5.1)
Sodium: 136 mmol/L (ref 135–145)

## 2021-07-19 LAB — PHOSPHORUS: Phosphorus: 2 mg/dL — ABNORMAL LOW (ref 2.5–4.6)

## 2021-07-19 LAB — MAGNESIUM: Magnesium: 2.5 mg/dL — ABNORMAL HIGH (ref 1.7–2.4)

## 2021-07-19 MED ORDER — OXYCODONE HCL 5 MG PO TABS
10.0000 mg | ORAL_TABLET | ORAL | Status: DC | PRN
  Administered 2021-07-19 – 2021-07-20 (×2): 10 mg via ORAL
  Filled 2021-07-19 (×2): qty 2

## 2021-07-19 MED ORDER — POTASSIUM & SODIUM PHOSPHATES 280-160-250 MG PO PACK
2.0000 | PACK | Freq: Three times a day (TID) | ORAL | Status: AC
  Administered 2021-07-19 (×2): 2 via ORAL
  Filled 2021-07-19 (×2): qty 2

## 2021-07-19 MED ORDER — AMIODARONE HCL 200 MG PO TABS: 200.0000 mg | ORAL_TABLET | Freq: Two times a day (BID) | ORAL | Status: DC

## 2021-07-19 MED ORDER — AMIODARONE HCL 200 MG PO TABS
400.0000 mg | ORAL_TABLET | Freq: Two times a day (BID) | ORAL | Status: DC
  Administered 2021-07-19 – 2021-07-20 (×2): 400 mg via ORAL
  Filled 2021-07-19 (×2): qty 2

## 2021-07-19 NOTE — Progress Notes (Signed)
[facility] Cardiology  CARDIOLOGY CONSULT NOTE  Patient ID: Becky Trevino MRN: [id number] DOB/AGE: [birth year] 75 y.o.  Admit date: 07/16/2021 Referring Physician Kasa Primary Physician [name], MD Primary Cardiologist [facility] Reason for Consultation SVT  HPI:  [name] is a 75 year old female with a history of type 2 diabetes, hypertension, hyperlipidemia, obesity, OSA, SVT, paroxysmal A. fib, chronic DVT of the right lower extremity status post IVC filter who was admitted with septic shock of unknown source.  Cardiology is consulted for SVT at a rate of 160 bpm. Converted with 6 mg adenosine. Had AF with RVR on 11/2  Interval history -  No acute events.  - She states she feels well and has no complaints today.  - Denies shortness of breath or chest pain.   Review of systems complete and found to be negative unless listed above     Past Medical History:  Diagnosis Date   Cervical spondylosis    Chronic kidney disease    Stage 3   DDD (degenerative disc disease), cervical    [facility]   Diabetes mellitus without complication (HCC)    Diverticulosis    Hyperlipidemia    Hypertension    Intertrigo    Leg cramps    Leg varices    Obesity    OSA (obstructive sleep apnea)    Symptomatic menopausal or female climacteric states    Syncope     Past Surgical History:  Procedure Laterality Date   ABDOMINAL HYSTERECTOMY  1993   Total   BUNIONECTOMY Bilateral 1993   I & D KNEE WITH POLY EXCHANGE Right 11/19/2017   Procedure: RIGHT KNEE POLY EXCHANGE WITH IRRIGATION AND DEBRIDEMENT;  Surgeon: Hessie Knows, MD;  Location: [facility] ORS;  Service: Orthopedics;  Laterality: Right;   JOINT REPLACEMENT     bilateral knee   LAMINECTOMY  11/14/2013    Cervical Fusion , [facility], Dr. Delilah Shan   LASER ABLATION Bilateral 07/29/2012   Dr. Lucky Cowboy   LOWER EXTREMITY ANGIOGRAPHY Right 11/02/2019   Procedure: Lower Extremity Angiography;  Surgeon: Katha Cabal, MD;  Location: [place] [facility];  Service: Cardiovascular;  Laterality: Right;   ORIF ANKLE FRACTURE Right 09/02/2019   Procedure: OPEN REDUCTION INTERNAL FIXATION (ORIF) ANKLE FRACTURE BIMALLEOLAR;  Surgeon: [name], DPM;  Location: [facility] OR[facility];  Service: Podiatry;  Laterality: Right;   PERIPHERAL VASCULAR THROMBECTOMY Right 03/02/2020   Procedure: PERIPHERAL VASCULAR THROMBECTOMY;  Surgeon: Algernon Huxley, MD;  Location: [place] [facility];  Service: Cardiovascular;  Laterality: Right;   REPLACEMENT TOTAL KNEE Left 03/2010   Dr. Rudene Christians   TOTAL HIP ARTHROPLASTY Left 12/11/2015   Procedure: TOTAL HIP ARTHROPLASTY ANTERIOR APPROACH;  Surgeon: Hessie Knows, MD;  Location: [facility];  Service: Orthopedics;  Laterality: Left;   TOTAL KNEE ARTHROPLASTY Right 03/11/2016   Procedure: TOTAL KNEE ARTHROPLASTY;  Surgeon: Hessie Knows, MD;  Location: [facility];  Service: Orthopedics;  Laterality: Right;    Medications Prior to Admission  Medication Sig Dispense Refill Last Dose   ASPIRIN LOW DOSE 81 MG EC tablet TAKE 1 TABLET BY MOUTH EVERY DAY 90 tablet 0 07/15/2021   Calcium Carbonate-Vitamin D 600-400 MG-UNIT tablet Take 1 tablet by mouth 2 (two) times daily. 60 tablet 0 07/15/2021   cephALEXin (KEFLEX) 500 MG capsule Take 500 mg by mouth 3 (three) times daily.   07/15/2021   doxycycline (VIBRAMYCIN) 100 MG capsule Take 100 mg by mouth 2 (two) times daily.   07/15/2021   levothyroxine (SYNTHROID)  25 MCG tablet TAKE 1 TABLET (25 MCG TOTAL) BY MOUTH DAILY AT 6 (SIX) AM. 90 tablet 1 07/15/2021   Magnesium Oxide 500 MG CAPS Take 500 mg by mouth daily.    07/15/2021   metoprolol succinate (TOPROL-XL) 25 MG 24 hr tablet TAKE 1 TABLET (25 MG TOTAL) BY MOUTH DAILY. 90 tablet 1 07/15/2021   mirabegron ER (MYRBETRIQ) 50 MG TB24 tablet Take 1 tablet (50 mg total) by mouth daily. 90 tablet 3 07/15/2021   oxyCODONE (OXY IR/ROXICODONE) 5 MG immediate release tablet Take 5 mg by mouth every 4 (four) hours as needed.   07/15/2021    potassium chloride (KLOR-CON) 10 MEQ tablet Take 10 mEq by mouth daily.   07/15/2021   SIMBRINZA 1-0.2 % SUSP Place 1 drop into both eyes 2 (two) times daily.   07/15/2021   acetaminophen (TYLENOL) 650 MG CR tablet Take 650-1,300 mg by mouth 2 (two) times daily as needed for pain.   prn at prn   atorvastatin (LIPITOR) 40 MG tablet Take 1 tablet (40 mg total) by mouth every evening. 90 tablet 1 07/14/2021   fluticasone (FLONASE) 50 MCG/ACT nasal spray SPRAY 2 SPRAYS INTO EACH NOSTRIL EVERY DAY 48 mL 0 prn at prn   ketoconazole (NIZORAL) 2 % cream Apply 1 application topically 2 (two) times daily as needed (rash).    prn at prn   latanoprost (XALATAN) 0.005 % ophthalmic solution SMARTSIG:In Eye(s)   07/14/2021   polyethylene glycol (MIRALAX / GLYCOLAX) 17 g packet Take 17 g by mouth daily. 14 each 0 prn at prn   torsemide (DEMADEX) 20 MG tablet Take 1 tablet (20 mg total) by mouth daily. 90 tablet 1    traZODone (DESYREL) 50 MG tablet Take 0.5-1 tablets (25-50 mg total) by mouth at bedtime as needed for sleep. 90 tablet 1 07/14/2021   Social History   Socioeconomic History   Marital status: Married    Spouse name: Not on file   Number of children: 1   Years of education: Not on file   Highest education level: Not on file  Occupational History   Not on file  Tobacco Use   Smoking status: Never   Smokeless tobacco: Never  Vaping Use   Vaping Use: Never used  Substance and Sexual Activity   Alcohol use: No    Alcohol/week: 0.0 standard drinks   Drug use: No   Sexual activity: Yes    Partners: Male  Other Topics Concern   Not on file  Social History Narrative   Married, one adopted grown child    Social Determinants of Radio broadcast assistant Strain: Low Risk    Difficulty of Paying Living Expenses: Not hard at all  Food Insecurity: No Food Insecurity   Worried About Charity fundraiser in the Last Year: Never true   Arboriculturist in the Last Year: Never true   Transportation Needs: No Transportation Needs   Lack of Transportation (Medical): No   Lack of Transportation (Non-Medical): No  Physical Activity: Inactive   Days of Exercise per Week: 0 days   Minutes of Exercise per Session: 30 min  Stress: No Stress Concern Present   Feeling of Stress : Not at all  Social Connections: Socially Integrated   Frequency of Communication with Friends and Family: More than three times a week   Frequency of Social Gatherings with Friends and Family: More than three times a week   Attends Religious Services: More than  4 times per year   Active Member of Clubs or Organizations: Yes   Attends Archivist Meetings: Never   Marital Status: Married  Human resources officer Violence: Not At Risk   Fear of Current or Ex-Partner: No   Emotionally Abused: No   Physically Abused: No   Sexually Abused: No    Family History  Problem Relation Age of Onset   Diabetes Mother    Hyperlipidemia Mother    Hypertension Mother    Diabetes Father    Hyperlipidemia Father    Hypertension Father    Obesity Father    Hypertension Sister    Hyperlipidemia Sister    Hyperlipidemia Sister    Breast cancer Neg Hx       Review of systems complete and found to be negative unless listed above      PHYSICAL EXAM  Vitals:   07/19/21 0400 07/19/21 0446  BP: 121/75   Pulse: 64   Resp: 16   Temp:  (!) 97.4 F (36.3 C)  SpO2: 99%      General: lethargic. Does not respond to voice.  HEENT:  Normocephalic and atramatic Neck:  No JVD.  Lungs: Clear bilaterally to auscultation and percussion. Heart: HRRR . Normal S1 and S2 without gallops or murmurs.  Abdomen: Bowel sounds are positive, abdomen soft and non-tender  Msk:  Back normal, normal gait. Normal strength and tone for age. Extremities: Chronic venous stasis changes.  Neuro: Alert and oriented X 3. Psych:  Good affect, responds appropriately  Labs:   Lab Results  Component Value Date   WBC 8.8  07/19/2021   HGB 10.8 (L) 07/19/2021   HCT 32.7 (L) 07/19/2021   MCV 95.1 07/19/2021   PLT 104 (L) 07/19/2021    Recent Labs  Lab 07/17/21 0420 07/18/21 0559 07/19/21 0545  NA 136   < > 136  K 4.7   < > 3.5  CL 109   < > 109  CO2 21*   < > 20*  BUN 40*   < > 34*  CREATININE 2.24*   < > 1.21*  CALCIUM 8.3*   < > 8.6*  PROT 6.5  --   --   BILITOT 1.1  --   --   ALKPHOS 100  --   --   ALT 29  --   --   AST 22  --   --   GLUCOSE 114*   < > 80   < > = values in this interval not displayed.    Lab Results  Component Value Date   CKTOTAL 133 07/18/2021   TROPONINI < 0.02 05/22/2012     Lab Results  Component Value Date   CHOL 157 02/04/2021   CHOL 152 01/27/2020   CHOL 148 11/25/2018   Lab Results  Component Value Date   HDL 73 02/04/2021   HDL 69 01/27/2020   HDL 73 11/25/2018   Lab Results  Component Value Date   LDLCALC 69 02/04/2021   LDLCALC 64 01/27/2020   LDLCALC 62 11/25/2018   Lab Results  Component Value Date   TRIG 71 02/04/2021   TRIG 103 01/27/2020   TRIG 51 11/25/2018   Lab Results  Component Value Date   CHOLHDL 2.2 02/04/2021   CHOLHDL 2.2 01/27/2020   CHOLHDL 2.0 11/25/2018   No results found for: LDLDIRECT    Radiology: CT ABDOMEN PELVIS WO CONTRAST  Result Date: 07/16/2021 CLINICAL DATA:  Abdominal pain and fever. EXAM: CT ABDOMEN  AND PELVIS WITHOUT CONTRAST TECHNIQUE: Multidetector CT imaging of the abdomen and pelvis was performed following the standard protocol without IV contrast. COMPARISON:  Pelvic CT dated 01/09/2020 and CT abdomen pelvis dated 11/01/2019. FINDINGS: Evaluation of this exam is limited in the absence of intravenous contrast. Lower chest: Bibasilar atelectasis/scarring. Coronary vascular calcification. No intra-abdominal free air or free fluid. Hepatobiliary: The liver is unremarkable. No intrahepatic biliary dilatation. Layering high attenuating content within the gallbladder may represent sludge or small stones. No  pericholecystic fluid or evidence of acute cholecystitis by CT. Pancreas: Unremarkable. No pancreatic ductal dilatation or surrounding inflammatory changes. Spleen: Normal in size without focal abnormality. Adrenals/Urinary Tract: The adrenal glands are unremarkable. There is no hydronephrosis or nephrolithiasis on either side. A 1 cm right renal upper pole hypodense lesion is suboptimally characterized. The visualized ureters appear unremarkable. The urinary bladder is poorly visualized and obscured by streak artifact caused by left hip arthroplasty. Stomach/Bowel: Moderate stool throughout the colon. There is a 2 cm duodenal diverticulum without active inflammatory changes. There is no bowel obstruction or active inflammation. The appendix is normal. Vascular/Lymphatic: Moderate aortoiliac atherosclerotic disease. An infrarenal IVC filter noted. There is no adenopathy. Reproductive: The uterus is not visualized. Other: None Musculoskeletal: Degenerative changes of the spine and scoliosis. Severe right hip arthritic changes with protrusio acetabula and fragmentation of the acetabulum and medial acetabular wall. Total left hip arthroplasty. No acute osseous pathology. Right hip effusion. IMPRESSION: 1. No acute intra-abdominal or pelvic pathology. No hydronephrosis or nephrolithiasis. 2. Moderate colonic stool burden. No bowel obstruction. Normal appendix. 3. Severe right hip arthritic changes with protrusion acetabula and fragmentation of the acetabulum and medial acetabular wall. Right hip effusion. 4. Aortic Atherosclerosis (ICD10-I70.0). Electronically Signed   By: Anner Crete M.D.   On: 07/16/2021 00:58   DG Chest 1 View  Result Date: 07/15/2021 CLINICAL DATA:  Pt reports she is only here to "get checked out to see if have COVID or flu". States she has been feeling weak for the past few days but feels better today. Reports decreased eating, drinking, urine output. Last urine output this am. EXAM:  CHEST  1 VIEW COMPARISON:  Chest x-ray 02/23/2021, CT chest 02/23/2021 FINDINGS: The heart and mediastinal contours are unchanged. Low lung volumes. No focal consolidation. No pulmonary edema. Trace pleural effusions not excluded. No pneumothorax. No acute osseous abnormality. Severe degenerative changes of the shoulders bilaterally. IMPRESSION: Low lung volumes.  Trace pleural effusions not excluded. Electronically Signed   By: Iven Finn M.D.   On: 07/15/2021 17:30   DG FLUORO GUIDED NEEDLE PLC ASPIRATION/INJECTION LOC  Result Date: 07/17/2021 CLINICAL DATA:  Patient with a fever and concern for sepsis with complaints of right hip pain with right hip effusion seen on CT imaging. Request received for right hip joint aspiration. EXAM: EXAM RIGHT HIP ASPIRATION UNDER FLUOROSCOPY FLUOROSCOPY TIME:  0.2 MINUTE, 3.1 MGY, 2 IMAGES. TECHNIQUE: Risks and benefits of the procedure were discussed with the patient and her husband today including but not limiting to bleeding, infection, damage to adjacent structures, low yield of diagnostic studies, unable to obtain fluid and the use of fluoroscopy. All questions were answered and informed written consent was obtained. An appropriate skin entry site was determined under fluoroscopy. Skin site was marked, prepped with Betadine, and draped in usual sterile fashion, and infiltrated locally with 1% lidocaine. A 20-gauge needle was advanced to the right femoroacetabular joint space under fluoroscopy, 7 mL's of serosanguineous fluid was successfully aspirated. Following the  joint aspiration, intra-articular location was confirmed with injection of 2 mL of sterile iodinated contrast. All fluid was sent to the lab for analysis. The needle was removed in its entirety and a sterile bandage was applied. No immediate complications. COMPLICATIONS: COMPLICATIONS none IMPRESSION: 1. Technically successful fluoroscopic guided right femoroacetabular joint space aspiration yielding 7  mL of serosanguineous fluid. Read By: [name] PA-C Electronically Signed   By: Kathreen Devoid M.D.   On: 07/17/2021 16:24    EKG: SVT at rate of 160 bpm  Echocardiogram 2D complete: (09/07/2020) IMPRESSIONS   1. Left ventricular ejection fraction, by estimation, is 60 to 65%. The left ventricle has normal function. The left ventricle has no regional wall motion abnormalities. Left ventricular diastolic parameters were normal.   2. Right ventricular systolic function is normal. The right ventricular size is normal.   3. The mitral valve is normal in structure. Trivial mitral valve regurgitation.   4. The aortic valve is normal in structure. Aortic valve regurgitation is not visualized.   ASSESSMENT AND PLAN:  Shaqueena Mauceri is a 75 year old female with a history of type 2 diabetes, hypertension, hyperlipidemia, obesity, OSA, SVT, paroxysmal A. fib, chronic DVT of the right lower extremity status post IVC filter who was admitted with septic shock of unknown source.  Cardiology is consulted for SVT at a rate of 160 bpm.  # SVT #Multisystem organ failure She has a previous history of SVT, though in the past there has been some consideration that this may in fact be atrial flutter.  She developed SVT with a rate of 160 in the setting of multisystem organ failure which resolved with 6 mg of IV adenosine.  Per report there were no apparent flutter waves prior to cardioversion. She has not had recurrence -Adenosine as needed for recurrent SVT -Metoprolol XL 25 mg daily resumed 11/3 -On amiodarone now for AF  # Atrial fibrillation/ flutter Developed on AM of 11/2, now back in NSR after amiodarone given. Some question previously about whether her SVT was in fact flutter.  - [place] 2 Vasc = 4 (2 age, DM, female). Need to discuss outpatient anticoagulation for stroke risk reduction. Consider starting at DC; patient had severe epistaxis recently. - Continue amiodarone  400 mg BID x 7 days. Then 200 mg  thereafter - home metoprolol XL 25 mg  Signed: Andrez Grime MD 07/19/2021, 8:03 AM

## 2021-07-19 NOTE — Progress Notes (Signed)
Date of Admission:  07/16/2021    ID: Becky Trevino is a 75 y.o. female Active Problems:   Septic shock ([place])   Right hip joint effusion   Leukocytosis    Subjective: Pt is feeling much better Back to baseline No epistaxis No pain joints more than baseline   Medications:   amiodarone  400 mg Oral BID   Followed by   Derrill Memo ON 07/25/2021] amiodarone  200 mg Oral BID   aspirin EC  81 mg Oral Daily   atorvastatin  40 mg Oral QPM   brinzolamide  1 drop Both Eyes BID   And   brimonidine  1 drop Both Eyes BID   calcium-vitamin D  1 tablet Oral BID   Chlorhexidine Gluconate Cloth  6 each Topical Daily   doxycycline  100 mg Oral Q12H   fluticasone  1 spray Each Nare Daily   heparin  5,000 Units Subcutaneous Q8H   latanoprost  1 drop Both Eyes QHS   levothyroxine  25 mcg Oral Q0600   magnesium oxide  400 mg Oral Daily   metoprolol succinate  25 mg Oral Daily   mirabegron ER  50 mg Oral Daily   mupirocin ointment   Nasal BID   potassium & sodium phosphates  2 packet Oral TID WC & HS   potassium chloride  10 mEq Oral Daily   sodium chloride  1 spray Each Nare TID    Objective: Vital signs in last 24 hours: Temp:  [97.4 F (36.3 C)-99.7 F (37.6 C)] 97.7 F (36.5 C) (11/04 1200) Pulse Rate:  [58-81] 68 (11/04 1500) Resp:  [11-19] 14 (11/04 1500) BP: (121-142)/(73-110) 142/82 (11/04 1300) SpO2:  [95 %-100 %] 97 % (11/04 1500) Weight:  [128.5 kg] 128.5 kg (11/04 0446)  PHYSICAL EXAM:  General: Alert, cooperative, no distress, appears stated age.  Head: Normocephalic, without obvious abnormality, atraumatic. Eyes: Conjunctivae clear, anicteric sclerae. Pupils are equal ENT Nares normal. No drainage or sinus tenderness. Lips, mucosa, and tongue normal. No Thrush Neck: Supple, symmetrical, no adenopathy, thyroid: non tender no carotid bruit and no JVD. Back: No CVA tenderness. Lungs: Clear to auscultation bilaterally. No Wheezing or Rhonchi. No rales. Heart: Regular  rate and rhythm, no murmur, rub or gallop. Abdomen: Soft, non-tender,not distended. Bowel sounds normal. No masses Extremities: edema legs B/l knee surgical scar Moving limbs  Skin: No rashes or lesions. Or bruising Lymph: Cervical, supraclavicular normal. Neurologic: Grossly non-focal  Lab Results Recent Labs    07/18/21 0559 07/19/21 0545  WBC 8.8 8.8  HGB 11.4* 10.8*  HCT 34.8* 32.7*  NA 135 136  K 3.9 3.5  CL 108 109  CO2 20* 20*  BUN 38* 34*  CREATININE 1.43* 1.21*   Liver Panel Recent Labs    07/17/21 0420  PROT 6.5  ALBUMIN 2.7*  AST 22  ALT 29  ALKPHOS 100  BILITOT 1.1  BILIDIR 0.3*  IBILI 0.8   Sedimentation Rate Recent Labs    07/18/21 0559  ESRSEDRATE 49*   C-Reactive Protein Recent Labs    07/18/21 0559  CRP 11.2*    Microbiology:  Studies/Results: No results found.   Assessment/Plan: ?pt presenting with  sepsis like picture with preceding epistaxis for which she had to get nasal packing  no clear source identified No pneumonia, no cellulitis, no Uti Chronic rt hip arthritis-pain- 7cc aspirated and cell count wa sintially reported as  73K - (37 L, 60M and 41% N) but repeat was 1300  and then 900 NO eptic arthritis  No left hip prosthetic joint infection Discontinue dapto and cefepime and change to unasyn    Recent epistaxis and had nasal packing which was removed after admission- will do unasyn in case it was related to the packing to cover strep , anerobes      AKI- creatinine was worsening and vanco Dced yesterday- cr has improved   H/o Rt DVT- has IVC filter B/l lymphedema legs   H/o complicated infections 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

## 2021-07-19 NOTE — Consult Note (Signed)
PHARMACY CONSULT NOTE - FOLLOW UP  Pharmacy Consult for Electrolyte Monitoring and Replacement   Recent Labs: Potassium (mmol/L)  Date Value  07/19/2021 3.5  10/18/2012 3.5   Magnesium (mg/dL)  Date Value  07/19/2021 2.5 (H)   Calcium (mg/dL)  Date Value  07/19/2021 8.6 (L)   Calcium, Total (mg/dL)  Date Value  10/18/2012 7.9 (L)   Albumin (g/dL)  Date Value  07/17/2021 2.7 (L)  11/26/2015 4.3  05/22/2012 3.4   Phosphorus (mg/dL)  Date Value  07/19/2021 2.0 (L)   Sodium (mmol/L)  Date Value  07/19/2021 136  11/26/2015 142  10/18/2012 141     Assessment: 75yo female w/ h/o SVT, TCP, DVT, MRSA bacteremia, T2DM, CKD, HLD,  & HTN presenting w/ AKI on CKD, hypotension, and dec'd UOP. Broad spectrum abx were started with c/f sepsis but despite fluid resuscitation pt remain MAP <65 and started levophed with CCU transfer. ID consulted, no clear source of infection. Antibiotics have been de-escalated. Pharmacy consulted for the management of electrolytes.   Goal of Therapy:  Lytes WNL  Plan:  Phos slightly low, trending down Phos NaK 2 packets x 2 doses Will recheck phos tomorrow morning  Tawnya Crook, PharmD, BCPS Clinical Pharmacist 07/19/2021 2:27 PM

## 2021-07-19 NOTE — Telephone Encounter (Signed)
Good evening!! My mother Dalayla Aldredge DOB 07-02-2046, needs an order for a TOTAL ELECTRIC  hospital bed for at home. [facility] said all they need is a new script from Dr Ancil Boozer and for it to be faxed to them. Here is their fax# [facility]: [phone]. Thank you all for your attention to this matter. Any further questions please call me at [phone]

## 2021-07-19 NOTE — Evaluation (Signed)
Physical Therapy Evaluation Patient Details Name: Becky Trevino MRN: [id number] DOB: 1946/01/09 Today's Date: 07/19/2021  History of Present Illness  Pt is a 75 y/o F admitted on 07/16/21 after presenting with c/c of hypotension & decreased urine output. Pt was aditted with septic shock of unknown etiology. Orthopedics was consulted & R hip aspiration was performed; it was determined R hip showed no sign of infection. Cardiology was consulted for SVT at rate of 160 bpm, which resolved with IV adenosine. PMH: anasarca, SVT, AKI on CKD, thrombocytopenia, DVT, MRSA, chronic infection of R knee, CKD stage 3, DM  Clinical Impression  Pt seen for PT evaluation with husband present for session. Per pt & husband pt required assistance for bed mobility & ambulated extremely short distances in the home prior to admission. Pt primarily used her hoverround for mobility. On this date pt required mod<>max assist for bed mobility, min assist STS from significantly elevated EOB but is unable to take side steps at EOB (pt limited by RW & would benefit from using bariatric RW during next session). Pt notes 8/10 fatigue after returning to bed & declines further gait attempts when PT suggests obtaining bariatric RW. Anticipate pt can d/c home with HHPT f/u as pt does not appear significantly different from her baseline level of function. Will continue to follow pt acutely to progress gait as able.       Recommendations for follow up therapy are one component of a multi-disciplinary discharge planning process, led by the attending physician.  Recommendations may be updated based on patient status, additional functional criteria and insurance authorization.  Follow Up Recommendations Home health PT    Assistance Recommended at Discharge Intermittent Supervision/Assistance  Functional Status Assessment  (pt with minimal change in functional status)  Equipment Recommendations  None recommended by PT    Recommendations  for Other Services       Precautions / Restrictions Precautions Precautions: Fall Restrictions Weight Bearing Restrictions: Yes RLE Weight Bearing: Weight bearing as tolerated      Mobility  Bed Mobility Overal bed mobility: Needs Assistance Bed Mobility: Supine to Sit;Sit to Supine     Supine to sit: Mod assist;HOB elevated (assistance to upright trunk) Sit to supine: Max assist;HOB elevated (assistance to elevate BLE onto bed)   General bed mobility comments: +2 assist to scoot to [facility] with bed in trendelenburg position    Transfers Overall transfer level: Needs assistance Equipment used: Rolling walker (2 wheels) Transfers: Sit to/from Stand Sit to Stand: Min assist           General transfer comment: Significantly elevated EOB    Ambulation/Gait                Stairs            Wheelchair Mobility    Modified Rankin (Stroke Patients Only)       Balance Overall balance assessment: Needs assistance Sitting-balance support: Feet supported;Bilateral upper extremity supported Sitting balance-Leahy Scale: Fair Sitting balance - Comments: supervision static sitting EOB   Standing balance support: During functional activity;Bilateral upper extremity supported Standing balance-Leahy Scale: Fair Standing balance comment: BUE support on RW with CGA<>Min assist                             Pertinent Vitals/Pain Pain Assessment: No/denies pain    Home Living Family/patient expects to be discharged to:: Private residence Living Arrangements: Spouse/significant other Available Help at Discharge: Family  Type of Home: Apartment Home Access: Level entry       Home Layout: One level Home Equipment: Conservation officer, nature (2 wheels);Hospital bed (hover round)      Prior Function               Mobility Comments: Pt & husband Clarita Leber) report pt required assistance for bed mobility, completed transfers from elevated surfaces (hospital bed,  BSC, hoverround), but pt was able to walk ~5-10 ft with RW without assistance.       Hand Dominance        Extremity/Trunk Assessment   Upper Extremity Assessment Upper Extremity Assessment: Generalized weakness    Lower Extremity Assessment Lower Extremity Assessment: Generalized weakness (grossly 3+/5 BLE knee extension)       Communication   Communication: No difficulties  Cognition Arousal/Alertness: Awake/alert Behavior During Therapy: WFL for tasks assessed/performed;Flat affect Overall Cognitive Status: Within Functional Limits for tasks assessed                                          General Comments      Exercises     Assessment/Plan    PT Assessment Patient needs continued PT services  PT Problem List Decreased strength;Decreased mobility;Decreased activity tolerance;Decreased balance       PT Treatment Interventions Therapeutic activities;DME instruction;Modalities;Gait training;Therapeutic exercise;Patient/family education;Balance training;Functional mobility training;Neuromuscular re-education;Manual techniques;Wheelchair mobility training    PT Goals (Current goals can be found in the Care Plan section)  Acute Rehab PT Goals Patient Stated Goal: get better PT Goal Formulation: With patient/family Time For Goal Achievement: 08/02/21 Potential to Achieve Goals: Fair    Frequency Min 2X/week   Barriers to discharge        Co-evaluation               AM-PAC PT "6 Clicks" Mobility  Outcome Measure Help needed turning from your back to your side while in a flat bed without using bedrails?: A Lot Help needed moving from lying on your back to sitting on the side of a flat bed without using bedrails?: A Lot Help needed moving to and from a bed to a chair (including a wheelchair)?: A Lot Help needed standing up from a chair using your arms (e.g., wheelchair or bedside chair)?: A Lot Help needed to walk in hospital room?: A  Lot Help needed climbing 3-5 steps with a railing? : Total 6 Click Score: 11    End of Session   Activity Tolerance: Patient limited by fatigue;Patient tolerated treatment well Patient left: in bed;with call bell/phone within reach;with bed alarm set;with family/visitor present Nurse Communication: Mobility status PT Visit Diagnosis: Muscle weakness (generalized) (M62.81);Difficulty in walking, not elsewhere classified (R26.2)    Time: 2355-7322 PT Time Calculation (min) (ACUTE ONLY): 20 min   Charges:   PT Evaluation $PT Eval Moderate Complexity: [place], PT, DPT 07/19/21, 10:52 AM   Waunita Schooner 07/19/2021, 10:49 AM

## 2021-07-19 NOTE — Progress Notes (Signed)
[facility] [place] NAME: Becky Trevino    MR#:  [id number]  DATE OF BIRTH:  Jan 13, 1946  SUBJECTIVE:   no fever. Overall feels a lot better.  No chest pain. Remains in sinus rhythm. REVIEW OF SYSTEMS:   Review of Systems  Constitutional:  Negative for chills, fever and weight loss.  HENT:  Negative for ear discharge, ear pain and nosebleeds.   Eyes:  Negative for blurred vision, pain and discharge.  Respiratory:  Negative for sputum production, shortness of breath, wheezing and stridor.   Cardiovascular:  Negative for chest pain, palpitations, orthopnea and PND.  Gastrointestinal:  Negative for abdominal pain, diarrhea, nausea and vomiting.  Genitourinary:  Negative for frequency and urgency.  Musculoskeletal:  Negative for back pain and joint pain.  Neurological:  Positive for weakness. Negative for sensory change, speech change and focal weakness.  Psychiatric/Behavioral:  Negative for depression and hallucinations. The patient is not nervous/anxious.   Tolerating Diet:yes Tolerating PT: HHPT  DRUG ALLERGIES:   Allergies  Allergen Reactions  . Lisinopril Swelling    VITALS:  Blood pressure (!) 142/82, pulse 64, temperature 97.7 F (36.5 C), temperature source Oral, resp. rate 15, height 5\' 11"  (1.803 m), weight 128.5 kg, SpO2 99 %.  PHYSICAL EXAMINATION:   Physical Exam  GENERAL:  75 y.o.-year-old patient lying in the bed with no acute distress.obese  LUNGS: Normal breath sounds bilaterally, no wheezing, rales, rhonchi. No use of accessory muscles of respiration.  CARDIOVASCULAR: S1, S2 normal. No murmurs, rubs, or gallops.  ABDOMEN: Soft, nontender, nondistended. Bowel sounds present. No organomegaly or mass.  EXTREMITIES:chronic lymphedema   Mild ROM right hip NEUROLOGIC: Cranial nerves II through XII are intact. No focal Motor or sensory deficits b/l.   PSYCHIATRIC:  patient is alert and oriented x 3.  SKIN: No obvious  rash, lesion, or ulcer.   LABORATORY PANEL:  CBC Recent Labs  Lab 07/19/21 0545  WBC 8.8  HGB 10.8*  HCT 32.7*  PLT 104*     Chemistries  Recent Labs  Lab 07/17/21 0420 07/18/21 0559 07/19/21 0545  NA 136   < > 136  K 4.7   < > 3.5  CL 109   < > 109  CO2 21*   < > 20*  GLUCOSE 114*   < > 80  BUN 40*   < > 34*  CREATININE 2.24*   < > 1.21*  CALCIUM 8.3*   < > 8.6*  MG 2.2   < > 2.5*  AST 22  --   --   ALT 29  --   --   ALKPHOS 100  --   --   BILITOT 1.1  --   --    < > = values in this interval not displayed.    Cardiac Enzymes No results for input(s): TROPONINI in the last 168 hours. RADIOLOGY:  DG FLUORO GUIDED NEEDLE PLC ASPIRATION/INJECTION LOC  Result Date: 07/17/2021 CLINICAL DATA:  Patient with a fever and concern for sepsis with complaints of right hip pain with right hip effusion seen on CT imaging. Request received for right hip joint aspiration. EXAM: EXAM RIGHT HIP ASPIRATION UNDER FLUOROSCOPY FLUOROSCOPY TIME:  0.2 MINUTE, 3.1 MGY, 2 IMAGES. TECHNIQUE: Risks and benefits of the procedure were discussed with the patient and her husband today including but not limiting to bleeding, infection, damage to adjacent structures, low yield of diagnostic studies, unable to obtain fluid and the use of  fluoroscopy. All questions were answered and informed written consent was obtained. An appropriate skin entry site was determined under fluoroscopy. Skin site was marked, prepped with Betadine, and draped in usual sterile fashion, and infiltrated locally with 1% lidocaine. A 20-gauge needle was advanced to the right femoroacetabular joint space under fluoroscopy, 7 mL's of serosanguineous fluid was successfully aspirated. Following the joint aspiration, intra-articular location was confirmed with injection of 2 mL of sterile iodinated contrast. All fluid was sent to the lab for analysis. The needle was removed in its entirety and a sterile bandage was applied. No immediate  complications. COMPLICATIONS: COMPLICATIONS none IMPRESSION: 1. Technically successful fluoroscopic guided right femoroacetabular joint space aspiration yielding 7 mL of serosanguineous fluid. Read By: [name] PA-C Electronically Signed   By: Kathreen Devoid M.D.   On: 07/17/2021 16:24   ASSESSMENT AND PLAN:   75 y.o with significant PMH hypothyroidism, thrombocytopenia, SVT, M RSA bacteremia, chronic lymphedema, infection of right knee replacement, hypertension, CKD stage III who presented to the ED with chief complaints of hypotension and decreased urine output.  Severe sepsis/septic shock suspected due to questionable right hip joint effusion/? Epistaxis with nasal packing -- patient has PMH of group A streptococcus bacteremia left total hip arthroplasty right total knee arthroplasty with prostatic joint infection -- patient is off IV vasopressors. She received aggressive IV fluids. -- seen by Dr. Roland Rack-- does not appear to be infected right hip. Appears to be chronic DJD. -- Patient underwent 7 mL of seizure centimeters fluid drainage by IR for right hip. Fluid Gram stain so far negative. WBC 73,000 lymphocyte 37, neutrophil 41 -- empirically on IV vancomycin and cefepime per ID dr Leslee Home changed to IV unasyn --cont Doxycycline bid --chronic suppressive therapy for right knee infection -- sepsis resolved. -- Blood culture negative -- afebrile. White count normal  SVT/atrial flutter  -- patient received one dose of IV adenosine and converted to NSR -- patient developed atrial fibrillation flutter. She was placed on IV amiodarone drip. She is now in sinus rhythm. Will switch to amiodarone 400 mg BID for seven days and then 200 mg bid -- cardiology Dr Corky Sox recommends need to discuss outpatient anticoagulation given her recent epistaxis -- Resume metoprolol now blood pressure is stable  acute on chronic kidney disease stage III B -- creatinine improving -- encourage oral  intake -- avoid nephrotoxic agents  type II diabetes with CKD IIIB -- continue sliding scale for now  Hypothyroidism -- continue Synthroid  Bilateral lower extremity chronic venous stasis with chronic lymphedema -- evaluated by Dr. Lucky Cowboy-- no source of infection in the legs. Appears chronic changes  Hyperlipidemia -- continue statins  seen by palliative care. Appreciate input seen by physical therapy recommends home health PT. If continues to show improvement will discharged tomorrow.   Procedures: right hip aspriation 11/1 Family communication : Mr. Tollie Pizza on the phone  consults : ID, ENT, vascular, ortho, cardiology CODE STATUS: full DVT Prophylaxis : Lovenox Level of care: Progressive Cardiac Status is: Inpatient  transfer out of ICU        TOTAL TIME TAKING CARE OF THIS PATIENT: 30 minutes.  >50% time spent on counselling and coordination of care  Note: This dictation was prepared with Dragon dictation along with smaller phrase technology. Any transcriptional errors that result from this process are unintentional.  Fritzi Mandes M.D    [facility]   CC: Primary care physician; [name], MDPatient ID: Becky Trevino, female   DOB: [birth year], 75  y.o.   MRN: [id number]

## 2021-07-19 NOTE — Progress Notes (Signed)
Patient ID: Becky Trevino, female   DOB: May 05, 1946, 75 y.o.   MRN: [id number]  Subjective: The patient is feeling better overall.  She has no new complaints pertaining to her right hip.  She has undergone a right hip aspiration which shows no significant increase in the white blood cell count, suggesting there is no evidence for infection from this joint.   Objective: Vital signs in last 24 hours: Temp:  [97.4 F (36.3 C)-98 F (36.7 C)] 97.4 F (36.3 C) (11/04 0446) Pulse Rate:  [58-85] 64 (11/04 0400) Resp:  [11-19] 16 (11/04 0400) BP: (121-133)/(60-110) 121/75 (11/04 0400) SpO2:  [95 %-100 %] 99 % (11/04 0400) Weight:  [128.5 kg] 128.5 kg (11/04 0446)  Intake/Output from previous day: 11/03 0701 - 11/04 0700 In: 723.3 [I.V.:135.1; IV Piggyback:588.2] Out: 1050 [Urine:1050] Intake/Output this shift: No intake/output data recorded.  Recent Labs    07/17/21 0420 07/18/21 0559 07/19/21 0545  HGB 12.9 11.4* 10.8*   Recent Labs    07/18/21 0559 07/19/21 0545  WBC 8.8 8.8  RBC 3.66* 3.44*  HCT 34.8* 32.7*  PLT 116* 104*   Recent Labs    07/18/21 0559 07/19/21 0545  NA 135 136  K 3.9 3.5  CL 108 109  CO2 20* 20*  BUN 38* 34*  CREATININE 1.43* 1.21*  GLUCOSE 110* 80  CALCIUM 8.4* 8.6*   No results for input(s): LABPT, INR in the last 72 hours.  Physical Exam: Orthopedic examination is unchanged as compared to her examination from several days ago.  Skin inspection around the right hip remains unremarkable.  No swelling, erythema, ecchymosis, abrasions, or other skin abnormalities are identified.  She is able to move her hip actively without significant pain.  The neurovascular status of her right lower extremity remains unchanged.  Assessment: Right hip effusion secondary to advanced degenerative joint disease.  Plan: As mentioned by Dr. Francine Graven, the right hip aspiration shows no evidence for infection.  Although there is still no clear source of her  infection, it does not appear to be of orthopedic origin.  Therefore, I will sign off at this time.  I feel that she can begin to be mobilized with physical therapy, weightbearing as tolerated, once she is cleared from a medical standpoint.  Thank you for asking me to participate in the care of this most pleasant and unfortunate woman.  If you have further need of orthopedic input, please reconsult [place].   Marshall Cork Rad Gramling 07/19/2021, 8:04 AM

## 2021-07-20 DIAGNOSIS — R000 Tachycardia, unspecified: Secondary | ICD-10-CM | POA: Diagnosis not present

## 2021-07-20 DIAGNOSIS — M25451 Effusion, right hip: Secondary | ICD-10-CM | POA: Diagnosis not present

## 2021-07-20 DIAGNOSIS — D72829 Elevated white blood cell count, unspecified: Secondary | ICD-10-CM | POA: Diagnosis not present

## 2021-07-20 DIAGNOSIS — A419 Sepsis, unspecified organism: Secondary | ICD-10-CM | POA: Diagnosis not present

## 2021-07-20 LAB — CULTURE, BLOOD (SINGLE)
Culture: NO GROWTH
Special Requests: ADEQUATE

## 2021-07-20 LAB — MAGNESIUM: Magnesium: 2.4 mg/dL (ref 1.7–2.4)

## 2021-07-20 LAB — PHOSPHORUS: Phosphorus: 2.2 mg/dL — ABNORMAL LOW (ref 2.5–4.6)

## 2021-07-20 LAB — GLUCOSE, CAPILLARY: Glucose-Capillary: 86 mg/dL (ref 70–99)

## 2021-07-20 MED ORDER — OXYCODONE HCL 5 MG PO TABS: 5.0000 mg | ORAL_TABLET | ORAL | 0 refills | Status: DC | PRN

## 2021-07-20 MED ORDER — AMIODARONE HCL 400 MG PO TABS: 400.0000 mg | ORAL_TABLET | Freq: Two times a day (BID) | ORAL | 0 refills | Status: DC

## 2021-07-20 MED ORDER — SALINE SPRAY 0.65 % NA SOLN: 1.0000 | Freq: Three times a day (TID) | NASAL | 0 refills | Status: AC

## 2021-07-20 NOTE — Plan of Care (Signed)
Patient transferred from CCU in no acute distress. SB/NSR on tele, orientedx4, on RA. C/o right shoulder pain addressed with PRN oxycodone. Purewick in place. IV ABX infused w/o issue. Fall/safety precautions maintained, rounding performed, needs/concerns addressed.  Problem: Education: Goal: Knowledge of General Education information will improve Description: Including pain rating scale, medication(s)/side effects and non-pharmacologic comfort measures Outcome: Progressing   Problem: Health Behavior/Discharge Planning: Goal: Ability to manage health-related needs will improve Outcome: Progressing   Problem: Clinical Measurements: Goal: Ability to maintain clinical measurements within normal limits will improve Outcome: Progressing Goal: Will remain free from infection Outcome: Progressing Goal: Diagnostic test results will improve Outcome: Progressing Goal: Respiratory complications will improve Outcome: Progressing Goal: Cardiovascular complication will be avoided Outcome: Progressing   Problem: Nutrition: Goal: Adequate nutrition will be maintained Outcome: Progressing   Problem: Coping: Goal: Level of anxiety will decrease Outcome: Progressing   Problem: Elimination: Goal: Will not experience complications related to bowel motility Outcome: Progressing Goal: Will not experience complications related to urinary retention Outcome: Progressing   Problem: Pain Managment: Goal: General experience of comfort will improve Outcome: Progressing   Problem: Safety: Goal: Ability to remain free from injury will improve Outcome: Progressing   Problem: Skin Integrity: Goal: Risk for impaired skin integrity will decrease Outcome: Progressing

## 2021-07-20 NOTE — TOC Initial Note (Addendum)
Transition of Care [facility]) - Initial/Assessment Note    Patient Details  Name: Becky Trevino MRN: [id number] Date of Birth: [birth year]  [facility] CM/SW Contact:    Izola Price, RN Phone Number: 07/20/2021, 10:19 AM  Clinical Narrative: 11/5: Admitted 11/3 with Septic shock unknown source and right hip joint effusion status post aspiration per provider discharge summary.  Lives with spouse. Uses medical transport to get to appointments, NO issues getting/paying for medications. No steps to home. Confirmed PCP and RX on file in [place]. Needs Non-emergent transportation home. [place] services arranged via [facility] for P/OT/RN., [facility] discharged from service September 2022, but refused to renew. Dicussed with patient.  Patient has Hoverround at home and cannot maneuver getting in and out of car and into house, per daughter [name] and patient. Has other needed DME at home per patient and daughter. Simmie Davies RN CM    Barriers to Discharge: Transportation   Patient Goals and CMS Choice        Expected Discharge Plan and Services           Expected Discharge Date: 07/20/21               DME Arranged: N/A DME Agency: NA       HH Arranged: RN, PT, OT [place] Agency: [facility] ([place]) Date HH Agency Contacted: 07/20/21 Time [place]: 57 Representative spoke with at [place]: Floydene Flock  Prior Living Arrangements/Services                       Activities of Daily Living      Permission Sought/Granted                  Emotional Assessment              Admission diagnosis:  Weakness [R53.1] Sepsis ([place]) [A41.9] Patient Active Problem List   Diagnosis Date Noted   Right hip joint effusion    Leukocytosis    Septic shock ([place]) 07/16/2021   Atherosclerosis of aorta ([place]) 06/07/2021   Anasarca    Hypothyroidism    Tachyarrhythmia    Acute kidney injury superimposed on CKD (HCC)     Hypotension    Class 2 obesity without serious comorbidity with body mass index (BMI) of 39.0 to 39.9 in adult    Thrombocytopenia (HCC)    Mild protein-calorie malnutrition (HCC)    Hyperbilirubinemia    Injury of left femoral nerve 08/28/2020   History of DVT (deep vein thrombosis) 06/25/2020   Lymphedema 06/25/2020   History of pelvic hematoma 02/10/2020   Foot ulcer, right, limited to breakdown of skin ([place]) 12/02/2019   MRSA bacteremia 10/30/2019   Chronic venous hypertension due to deep vein thrombosis (DVT) 10/30/2019   Hematoma    Normocytic anemia 08/07/2019   Chronic infection of right knee ([place]) 11/25/2018   Infection and inflammatory reaction due to internal joint prosthesis, subsequent encounter 01/25/2018   Infection of total right knee replacement ([place]) 11/19/2017   Type 2 diabetes mellitus with hyperlipidemia ([place]) 11/06/2016   Primary osteoarthritis of knee 03/11/2016   Primary osteoarthritis of left hip 12/11/2015   Benign essential HTN 04/21/2015   Edema leg 04/21/2015   Chronic kidney disease (CKD), stage III (moderate) ([place]) 04/21/2015   Chronic venous insufficiency 04/21/2015   DDD (degenerative disc disease), cervical 04/21/2015   Diabetes mellitus with renal manifestation ([place]) 04/21/2015   Hyperlipidemia 04/21/2015  Bladder cystocele 04/21/2015   Urinary incontinence 04/21/2015   Leg varices 04/21/2015   Insomnia 04/21/2015   Eczema intertrigo 04/21/2015   Obesity, Class III, BMI 40-49.9 (morbid obesity) ([place]) 04/21/2015   OSA (obstructive sleep apnea) 04/21/2015   Menopausal and perimenopausal disorder 04/21/2015   Supraventricular premature beats 04/21/2015   Osteoarthritis of multiple joints 04/21/2015   H/O Spinal surgery 11/14/2013   Det[place]or muscle hypertonia 11/07/2013   PCP:  [name], MD Pharmacy:   [id number] - [place], [place] - [address] [place] [address] Phone: [phone] Fax:  [phone]     Social Determinants of Health (SDOH) Interventions    Readmission Risk Interventions No flowsheet data found.

## 2021-07-20 NOTE — Progress Notes (Signed)
Patient has been discharged home.  Discharge papers given and explained to patient.  She verbalized understanding.  Meds and f/u appointments reviewed.  Rx sent electronically to the pharmacy.  Patient made aware.  Transported by EMS.

## 2021-07-20 NOTE — Discharge Summary (Signed)
[facility] [place] NAME: Becky Trevino    MR#:  [id number]  DATE OF BIRTH:  08/31/1946  DATE OF ADMISSION:  07/16/2021 ADMITTING PHYSICIAN: No admitting provider for patient encounter.  DATE OF DISCHARGE: 07/20/2021  PRIMARY CARE PHYSICIAN: [name], MD    ADMISSION DIAGNOSIS:  Weakness [R53.1] Sepsis ([place]) [A41.9]  DISCHARGE DIAGNOSIS:  septic shock-- now resolved unclear etiology epistaxis status post nasal packing now removed right hip joint effusion status post aspiration. No evidence of septic arthritis Tachyarrhythmia (SVT/Fluuter)  SECONDARY DIAGNOSIS:   Past Medical History:  Diagnosis Date  . Cervical spondylosis   . Chronic kidney disease    Stage 3  . DDD (degenerative disc disease), cervical    [facility]  . Diabetes mellitus without complication ([place])   . Diverticulosis   . Hyperlipidemia   . Hypertension   . Intertrigo   . Leg cramps   . Leg varices   . Obesity   . OSA (obstructive sleep apnea)   . Symptomatic menopausal or female climacteric states   . Syncope     HOSPITAL COURSE:     75 y.o with significant PMH hypothyroidism, thrombocytopenia, SVT, M RSA bacteremia, chronic lymphedema, infection of right knee replacement, hypertension, CKD stage III who presented to the ED with chief complaints of hypotension and decreased urine output.   Severe sepsis/septic shock suspected due to questionable right hip joint effusion/? Epistaxis with nasal packing -- patient has PMH of group A streptococcus bacteremia left total hip arthroplasty right total knee arthroplasty with prostatic joint infection -- patient is off IV vasopressors. She received aggressive IV fluids. -- seen by Dr. Roland Rack-- does not appear to be infected right hip. Appears to be chronic DJD. -- Patient underwent 7 mL of seizure centimeters fluid drainage by IR for right hip. Fluid Gram stain so far negative. WBC 73,000 lymphocyte  37, neutrophil 41 -- empirically on IV vancomycin and cefepime per ID dr Leslee Home changed to IV unasyn since patient has nasal packing from epistaxis. No indication for outpatient treatment per ID. --cont Doxycycline bid --chronic suppressive therapy for right knee infection -- sepsis resolved. -- Blood culture negative -- afebrile. White count normal   SVT/atrial flutter  -- patient received one dose of IV adenosine and converted to NSR -- patient developed atrial fibrillation flutter. She was placed on IV amiodarone drip. She is now in sinus rhythm. Will switch to amiodarone 400 mg BID for seven days and then 200 mg bid -- cardiology Dr Corky Sox recommends need to discuss outpatient anticoagulation given her recent epistaxis -- Resume metoprolol now blood pressure is stable -- follow-up cardiology as outpatient   acute on chronic kidney disease stage III B -- creatinine improving -- encourage oral intake -- avoid nephrotoxic agents   type II diabetes with CKD IIIB -- continue sliding scale for now   Hypothyroidism -- continue Synthroid   Bilateral lower extremity chronic venous stasis with chronic lymphedema -- evaluated by Dr. Lucky Cowboy-- no source of infection in the legs. Appears chronic changes   Hyperlipidemia -- continue statins   seen by palliative care. Appreciate input seen by physical therapy recommends home health PT.  Overall patient seems to be improving. Will discharged to home with home health   Procedures: right hip aspriation 11/1 Family communication : patient's daughter in the room consults : ID, ENT, vascular, ortho, cardiology CODE STATUS: full DVT Prophylaxis : Lovenox Level of care: Progressive Cardiac Status is: Inpatient CONSULTS  OBTAINED:    DRUG ALLERGIES:   Allergies  Allergen Reactions  . Lisinopril Swelling    DISCHARGE MEDICATIONS:   Allergies as of 07/20/2021       Reactions   Lisinopril Swelling        Medication List      STOP taking these medications    cephALEXin 500 MG capsule Commonly known as: KEFLEX       TAKE these medications    acetaminophen 650 MG CR tablet Commonly known as: TYLENOL Take 650-1,300 mg by mouth 2 (two) times daily as needed for pain.   amiodarone 400 MG tablet Commonly known as: PACERONE Take 1 tablet (400 mg total) by mouth 2 (two) times daily. And from 07/25/21 take 200 mg 2 times a day   Aspirin Low Dose 81 MG EC tablet Generic drug: aspirin TAKE 1 TABLET BY MOUTH EVERY DAY   atorvastatin 40 MG tablet Commonly known as: LIPITOR Take 1 tablet (40 mg total) by mouth every evening.   Calcium Carbonate-Vitamin D 600-400 MG-UNIT tablet Take 1 tablet by mouth 2 (two) times daily.   doxycycline 100 MG capsule Commonly known as: VIBRAMYCIN Take 100 mg by mouth 2 (two) times daily.   fluticasone 50 MCG/ACT nasal spray Commonly known as: FLONASE SPRAY 2 SPRAYS INTO EACH NOSTRIL EVERY DAY   ketoconazole 2 % cream Commonly known as: NIZORAL Apply 1 application topically 2 (two) times daily as needed (rash).   latanoprost 0.005 % ophthalmic solution Commonly known as: XALATAN SMARTSIG:In Eye(s)   levothyroxine 25 MCG tablet Commonly known as: SYNTHROID TAKE 1 TABLET (25 MCG TOTAL) BY MOUTH DAILY AT 6 (SIX) AM.   Magnesium Oxide 500 MG Caps Take 500 mg by mouth daily.   metoprolol succinate 25 MG 24 hr tablet Commonly known as: TOPROL-XL TAKE 1 TABLET (25 MG TOTAL) BY MOUTH DAILY.   mirabegron ER 50 MG Tb24 tablet Commonly known as: Myrbetriq Take 1 tablet (50 mg total) by mouth daily.   oxyCODONE 5 MG immediate release tablet Commonly known as: Oxy IR/ROXICODONE Take 1 tablet (5 mg total) by mouth every 4 (four) hours as needed.   polyethylene glycol 17 g packet Commonly known as: MIRALAX / GLYCOLAX Take 17 g by mouth daily.   potassium chloride 10 MEQ tablet Commonly known as: KLOR-CON Take 10 mEq by mouth daily.   Simbrinza 1-0.2 %  Susp Generic drug: Brinzolamide-Brimonidine Place 1 drop into both eyes 2 (two) times daily.   sodium chloride 0.65 % Soln nasal spray Commonly known as: OCEAN Place 1 spray into both nostrils 3 (three) times daily.   torsemide 20 MG tablet Commonly known as: DEMADEX Take 1 tablet (20 mg total) by mouth daily.   traZODone 50 MG tablet Commonly known as: DESYREL Take 0.5-1 tablets (25-50 mg total) by mouth at bedtime as needed for sleep.        If you experience worsening of your admission symptoms, develop shortness of breath, life threatening emergency, suicidal or homicidal thoughts you must seek medical attention immediately by calling 911 or calling your MD immediately  if symptoms less severe.  You Must read complete instructions/literature along with all the possible adverse reactions/side effects for all the Medicines you take and that have been prescribed to you. Take any new Medicines after you have completely understood and accept all the possible adverse reactions/side effects.   Please note  You were cared for by a hospitalist during your hospital stay. If you have any questions about  your discharge medications or the care you received while you were in the hospital after you are discharged, you can call the unit and asked to speak with the hospitalist on call if the hospitalist that took care of you is not available. Once you are discharged, your primary care physician will handle any further medical issues. Please note that NO REFILLS for any discharge medications will be authorized once you are discharged, as it is imperative that you return to your primary care physician (or establish a relationship with a primary care physician if you do not have one) for your aftercare needs so that they can reassess your need for medications and monitor your lab values. Today   SUBJECTIVE  no new complaints. Overall doing well.   VITAL SIGNS:  Blood pressure 114/67, pulse 60,  temperature 97.7 F (36.5 C), temperature source Oral, resp. rate 15, height 5\' 11"  (1.803 m), weight 128.5 kg, SpO2 97 %.  I/O:   Intake/Output Summary (Last 24 hours) at 07/20/2021 0958 Last data filed at 07/20/2021 0630 Gross per 24 hour  Intake 716.71 ml  Output 500 ml  Net 216.71 ml    PHYSICAL EXAMINATION:  GENERAL:  75 y.o.-year-old patient lying in the bed with no acute distress.obese  LUNGS: Normal breath sounds bilaterally, no wheezing, rales, rhonchi. No use of accessory muscles of respiration.  CARDIOVASCULAR: S1, S2 normal. No murmurs, rubs, or gallops.  ABDOMEN: Soft, nontender, nondistended. Bowel sounds present. No organomegaly or mass.  EXTREMITIES:chronic lymphedema   Mild ROM right hip NEUROLOGIC: Cranial nerves II through XII are intact. No focal Motor or sensory deficits b/l.   PSYCHIATRIC:  patient is alert and oriented x 3.  SKIN: No obvious rash, lesion, or ulcer.  DATA REVIEW:   CBC  Recent Labs  Lab 07/19/21 0545  WBC 8.8  HGB 10.8*  HCT 32.7*  PLT 104*    Chemistries  Recent Labs  Lab 07/17/21 0420 07/18/21 0559 07/19/21 0545 07/20/21 0433  NA 136   < > 136  --   K 4.7   < > 3.5  --   CL 109   < > 109  --   CO2 21*   < > 20*  --   GLUCOSE 114*   < > 80  --   BUN 40*   < > 34*  --   CREATININE 2.24*   < > 1.21*  --   CALCIUM 8.3*   < > 8.6*  --   MG 2.2   < > 2.5* 2.4  AST 22  --   --   --   ALT 29  --   --   --   ALKPHOS 100  --   --   --   BILITOT 1.1  --   --   --    < > = values in this interval not displayed.    Microbiology Results   Recent Results (from the past 240 hour(s))  Resp Panel by RT-PCR (Flu A&B, Covid) Nasopharyngeal Swab     Status: None   Collection Time: 07/15/21  2:04 PM   Specimen: Nasopharyngeal Swab; Nasopharyngeal(NP) swabs in vial transport medium  Result Value Ref Range Status   SARS Coronavirus 2 by RT PCR NEGATIVE NEGATIVE Final    Comment: (NOTE) SARS-CoV-2 target nucleic acids are NOT  DETECTED.  The SARS-CoV-2 RNA is generally detectable in upper respiratory specimens during the acute phase of infection. The lowest concentration of SARS-CoV-2 viral copies this assay  can detect is 138 copies/mL. A negative result does not preclude SARS-Cov-2 infection and should not be used as the sole basis for treatment or other patient management decisions. A negative result may occur with  improper specimen collection/handling, submission of specimen other than nasopharyngeal swab, presence of viral mutation(s) within the areas targeted by this assay, and inadequate number of viral copies(<138 copies/mL). A negative result must be combined with clinical observations, patient history, and epidemiological information. The expected result is Negative.  Fact Sheet for Patients:  EntrepreneurPulse.com.au  Fact Sheet for Healthcare Providers:  IncredibleEmployment.be  This test is no t yet approved or cleared by the [place] FDA and  has been authorized for detection and/or diagnosis of SARS-CoV-2 by FDA under an Emergency Use Authorization (EUA). This EUA will remain  in effect (meaning this test can be used) for the duration of the COVID-19 declaration under Section 564(b)(1) of the Act, 21 U.S.C.section 360bbb-3(b)(1), unless the authorization is terminated  or revoked sooner.       Influenza A by PCR NEGATIVE NEGATIVE Final   Influenza B by PCR NEGATIVE NEGATIVE Final    Comment: (NOTE) The Xpert Xpress SARS-CoV-2/FLU/RSV plus assay is intended as an aid in the diagnosis of influenza from Nasopharyngeal swab specimens and should not be used as a sole basis for treatment. Nasal washings and aspirates are unacceptable for Xpert Xpress SARS-CoV-2/FLU/RSV testing.  Fact Sheet for Patients: EntrepreneurPulse.com.au  Fact Sheet for Healthcare Providers: IncredibleEmployment.be  This test is not yet  approved or cleared by the [place] FDA and has been authorized for detection and/or diagnosis of SARS-CoV-2 by FDA under an Emergency Use Authorization (EUA). This EUA will remain in effect (meaning this test can be used) for the duration of the COVID-19 declaration under Section 564(b)(1) of the Act, 21 U.S.C. section 360bbb-3(b)(1), unless the authorization is terminated or revoked.  Performed at [facility], [address] [place], [place] [address]   Blood culture (routine single)     Status: None   Collection Time: 07/15/21  6:08 PM   Specimen: BLOOD  Result Value Ref Range Status   Specimen Description BLOOD RIGHT ANTECUBITAL  Final   Special Requests   Final    BOTTLES DRAWN AEROBIC AND ANAEROBIC Blood Culture adequate volume   Culture   Final    NO GROWTH 5 DAYS Performed at [facility], [address] [place], [place] [address]    Report Status 07/20/2021 FINAL  Final  Blood culture (routine x 2)     Status: None (Preliminary result)   Collection Time: 07/16/21  1:04 AM   Specimen: BLOOD  Result Value Ref Range Status   Specimen Description BLOOD RIGHT ASSIST CONTROL  Final   Special Requests   Final    BOTTLES DRAWN AEROBIC AND ANAEROBIC Blood Culture results may not be optimal due to an inadequate volume of blood received in culture bottles   Culture   Final    NO GROWTH 4 DAYS Performed at [facility], [address] [place], [place] [address]    Report Status PENDING  Incomplete  Resp Panel by RT-PCR (Flu A&B, Covid) Nasopharyngeal Swab     Status: None   Collection Time: 07/16/21  1:21 AM   Specimen: Nasopharyngeal Swab; Nasopharyngeal(NP) swabs in vial transport medium  Result Value Ref Range Status   SARS Coronavirus 2 by RT PCR NEGATIVE NEGATIVE Final    Comment: (NOTE) SARS-CoV-2 target nucleic acids are NOT DETECTED.  The  SARS-CoV-2 RNA is generally detectable in upper respiratory specimens during the acute phase  of infection. The lowest concentration of SARS-CoV-2 viral copies this assay can detect is 138 copies/mL. A negative result does not preclude SARS-Cov-2 infection and should not be used as the sole basis for treatment or other patient management decisions. A negative result may occur with  improper specimen collection/handling, submission of specimen other than nasopharyngeal swab, presence of viral mutation(s) within the areas targeted by this assay, and inadequate number of viral copies(<138 copies/mL). A negative result must be combined with clinical observations, patient history, and epidemiological information. The expected result is Negative.  Fact Sheet for Patients:  EntrepreneurPulse.com.au  Fact Sheet for Healthcare Providers:  IncredibleEmployment.be  This test is no t yet approved or cleared by the [place] FDA and  has been authorized for detection and/or diagnosis of SARS-CoV-2 by FDA under an Emergency Use Authorization (EUA). This EUA will remain  in effect (meaning this test can be used) for the duration of the COVID-19 declaration under Section 564(b)(1) of the Act, 21 U.S.C.section 360bbb-3(b)(1), unless the authorization is terminated  or revoked sooner.       Influenza A by PCR NEGATIVE NEGATIVE Final   Influenza B by PCR NEGATIVE NEGATIVE Final    Comment: (NOTE) The Xpert Xpress SARS-CoV-2/FLU/RSV plus assay is intended as an aid in the diagnosis of influenza from Nasopharyngeal swab specimens and should not be used as a sole basis for treatment. Nasal washings and aspirates are unacceptable for Xpert Xpress SARS-CoV-2/FLU/RSV testing.  Fact Sheet for Patients: EntrepreneurPulse.com.au  Fact Sheet for Healthcare Providers: IncredibleEmployment.be  This test is not yet approved or cleared by the [place] FDA and has been authorized for detection and/or diagnosis of  SARS-CoV-2 by FDA under an Emergency Use Authorization (EUA). This EUA will remain in effect (meaning this test can be used) for the duration of the COVID-19 declaration under Section 564(b)(1) of the Act, 21 U.S.C. section 360bbb-3(b)(1), unless the authorization is terminated or revoked.  Performed at [facility], [address], [place], [place] [address]   Urine Culture     Status: None   Collection Time: 07/16/21  1:30 AM   Specimen: Urine, Random  Result Value Ref Range Status   Specimen Description   Final    URINE, RANDOM Performed at [facility], [address] [place], [place] [address]    Special Requests   Final    NONE Performed at [facility], [address], [place], [place] [address]    Culture   Final    NO GROWTH Performed at [facility], [place] [address], [place], [place] [address]    Report Status 07/16/2021 FINAL  Final  Blood culture (routine x 2)     Status: None (Preliminary result)   Collection Time: 07/16/21  6:44 AM   Specimen: BLOOD LEFT HAND  Result Value Ref Range Status   Specimen Description BLOOD LEFT HAND  Final   Special Requests   Final    BOTTLES DRAWN AEROBIC AND ANAEROBIC Blood Culture adequate volume   Culture   Final    NO GROWTH 4 DAYS Performed at [facility] [address] [place], [place] [address]    Report Status PENDING  Incomplete  MRSA Next Gen by PCR, Nasal     Status: Abnormal   Collection Time: 07/16/21  8:00 AM   Specimen: Nasal Mucosa; Nasal Swab  Result Value Ref Range Status  MRSA by PCR Next Gen DETECTED (A) NOT DETECTED Final    Comment: RESULT CALLED TO, READ BACK BY AND VERIFIED WITH: SHEIKA BAYNES 07/16/21 1004 KLW (NOTE) The GeneXpert MRSA Assay (FDA approved for NASAL specimens only), is one component of a comprehensive MRSA colonization surveillance program. It is not intended to diagnose MRSA infection nor to guide or monitor treatment for MRSA  infections. Test performance is not FDA approved in patients less than 49 years old. Performed at [facility], [place], [place] [address]   Anaerobic culture w Gram Stain     Status: None (Preliminary result)   Collection Time: 07/17/21  3:47 PM   Specimen: PATH Cytology Misc. fluid; Body Fluid  Result Value Ref Range Status   Specimen Description   Final    FLUID Performed at [facility], [address] [place], [place] [address]    Special Requests   Final    ASPIRATION OF RIGHT HIP Performed at [facility], [address] [place], [place] [address]    Gram Stain   Final    NO SQUAMOUS EPITHELIAL CELLS SEEN NO WBC SEEN NO ORGANISMS SEEN Performed at [facility], [place] [address] [place], [place] [address]    Culture   Final    NO ANAEROBES ISOLATED; CULTURE IN PROGRESS FOR 5 DAYS   Report Status PENDING  Incomplete  Body fluid culture w Gram Stain     Status: None (Preliminary result)   Collection Time: 07/17/21  3:47 PM   Specimen: PATH Cytology Misc. fluid; Body Fluid  Result Value Ref Range Status   Specimen Description   Final    FLUID Performed at [facility], [address], [place], [place] [address]    Special Requests   Final    ASPIRATION OF RIGHT HIP Performed at [facility], [address] [place], [place] [address]    Gram Stain   Final    FEW WBC PRESENT, PREDOMINANTLY MONONUCLEAR NO ORGANISMS SEEN SQUAMOUS EPITHELIAL CELLS PRESENT    Culture   Final    NO GROWTH 3 DAYS Performed at [facility], [place] [address] [place], [place] [address]    Report Status PENDING  Incomplete  Acid Fast Smear (AFB)     Status: None   Collection Time: 07/17/21  3:47 PM   Specimen: Synovial, Right Hip; Synovial Fluid  Result Value Ref Range Status   AFB Specimen Processing Concentration  Final   Acid Fast Smear Negative  Final    Comment: (NOTE) Performed At: [facility] [place], [place] [id number] Rush Farmer MD [id number]    Source (AFB) RIGHT HIP ASPIRATION  Final    Comment: Performed at [facility], [address] [place], [place] [address]    RADIOLOGY:  No results found.   CODE STATUS:     Code Status Orders  (From admission, onward)           Start     Ordered   07/16/21 0208  Full code  Continuous        07/16/21 0208           Code Status History     Date Active Date Inactive Code Status Order ID Comments User Context   09/02/2020 2025 09/14/2020 1846 Full Code [id number]  Reubin Milan, MD ED   10/28/2019 0506 11/07/2019 2326 Full Code [id number]  Mansy, Arvella Merles, MD Inpatient  08/07/2019 1604 08/09/2019 1856 Full Code [id number]  Para Skeans, MD ED   08/07/2019 1316 08/07/2019 1604 Full Code [id number]  Para Skeans, MD ED   11/19/2017 1141 11/22/2017 1429 Full Code [id number]  Hessie Knows, MD Inpatient   03/11/2016 1143 03/14/2016 2107 Full Code [id number]  Hessie Knows, MD Inpatient        TOTAL TIME TAKING CARE OF THIS PATIENT: 40 minutes.    Fritzi Mandes M.D  [facility]    CC: Primary care physician; [name], MD

## 2021-07-21 DIAGNOSIS — M25451 Effusion, right hip: Secondary | ICD-10-CM | POA: Diagnosis not present

## 2021-07-21 LAB — BODY FLUID CULTURE W GRAM STAIN: Culture: NO GROWTH

## 2021-07-21 LAB — CULTURE, BLOOD (ROUTINE X 2)
Culture: NO GROWTH
Culture: NO GROWTH
Special Requests: ADEQUATE

## 2021-07-22 ENCOUNTER — Telehealth: Payer: Self-pay

## 2021-07-22 DIAGNOSIS — L03115 Cellulitis of right lower limb: Secondary | ICD-10-CM | POA: Diagnosis not present

## 2021-07-22 LAB — ANAEROBIC CULTURE W GRAM STAIN: Gram Stain: NONE SEEN

## 2021-07-22 LAB — CYTOLOGY - NON PAP

## 2021-07-22 NOTE — Progress Notes (Signed)
Name: Becky Trevino   MRN: [id number]    DOB: Nov 10, 1945   Date:07/23/2021       Progress Note  [facility] Follow Up  HPI  Admission date: 07/16/21 Discharge Date: 07/20/21  Patient developed weakness , sob and confusion over the last weekend of October, husband decided to call 911, she arrived to [facility] 07/15/21  hypotensive BP 71/41, tachycardic HR 133, confused. She had a GFR of 15, WBC high at 13.4 and elevated lactic acid. She was admitted to ICU with diagnosis of sepsis. Organisms was not isolated .During her stay she had Adenosine for SVT and was sent home on amiodarone . She had aspiration of fluids from left hip but no organisms grew. She responded to antibiotics  and was discharged home on 07/20/21. She has been compliant with medications. She is back to her baseline, she is however feeling very anxious since discharge. She has a remote history of anxiety and panic attacks, and had to call 911 last night. She was shaky, nervous, but by the time EMS arrived she was feeling better. She took Prozac years ago. Discussed resuming medication for Anxiety. She has been feeling snappy and nervous inside, explained lexapro has a quicker onset of action but can interact with amiodarone. She will discuss it with Dr. Clayborn Bigness before starting medication due to potential QT prolongation  She came in today with her sister in law.   Medication reconciliation was done with patient  Patient Active Problem List   Diagnosis Date Noted   Right hip joint effusion    Leukocytosis    Septic shock ([place]) 07/16/2021   Atherosclerosis of aorta ([place]) 06/07/2021   Anasarca    Hypothyroidism    Tachyarrhythmia    Acute kidney injury superimposed on CKD ([place])    Hypotension    Class 2 obesity without serious comorbidity with body mass index (BMI) of 39.0 to 39.9 in adult    Thrombocytopenia (HCC)    Mild protein-calorie malnutrition (HCC)    Hyperbilirubinemia    Injury of left  femoral nerve 08/28/2020   History of DVT (deep vein thrombosis) 06/25/2020   Lymphedema 06/25/2020   History of pelvic hematoma 02/10/2020   Foot ulcer, right, limited to breakdown of skin ([place]) 12/02/2019   MRSA bacteremia 10/30/2019   Chronic venous hypertension due to deep vein thrombosis (DVT) 10/30/2019   Hematoma    Normocytic anemia 08/07/2019   Chronic infection of right knee ([place]) 11/25/2018   Infection and inflammatory reaction due to internal joint prosthesis, subsequent encounter 01/25/2018   Infection of total right knee replacement ([place]) 11/19/2017   Type 2 diabetes mellitus with hyperlipidemia ([place]) 11/06/2016   Primary osteoarthritis of knee 03/11/2016   Primary osteoarthritis of left hip 12/11/2015   Benign essential HTN 04/21/2015   Edema leg 04/21/2015   Chronic kidney disease (CKD), stage III (moderate) ([place]) 04/21/2015   Chronic venous insufficiency 04/21/2015   DDD (degenerative disc disease), cervical 04/21/2015   Diabetes mellitus with renal manifestation ([place]) 04/21/2015   Hyperlipidemia 04/21/2015   Bladder cystocele 04/21/2015   Urinary incontinence 04/21/2015   Leg varices 04/21/2015   Insomnia 04/21/2015   Eczema intertrigo 04/21/2015   Obesity, Class III, BMI 40-49.9 (morbid obesity) ([place]) 04/21/2015   OSA (obstructive sleep apnea) 04/21/2015   Menopausal and perimenopausal disorder 04/21/2015   Supraventricular premature beats 04/21/2015   Osteoarthritis of multiple joints 04/21/2015   H/O Spinal surgery 11/14/2013   Detrusor muscle hypertonia 11/07/2013  Past Surgical History:  Procedure Laterality Date   ABDOMINAL HYSTERECTOMY  1993   Total   BUNIONECTOMY Bilateral 1993   I & D KNEE WITH POLY EXCHANGE Right 11/19/2017   Procedure: RIGHT KNEE POLY EXCHANGE WITH IRRIGATION AND DEBRIDEMENT;  Surgeon: Hessie Knows, MD;  Location: [facility] ORS;  Service: Orthopedics;  Laterality: Right;   JOINT REPLACEMENT     bilateral knee   LAMINECTOMY   11/14/2013    Cervical Fusion , [facility], Dr. Delilah Shan   LASER ABLATION Bilateral 07/29/2012   Dr. Lucky Cowboy   LOWER EXTREMITY ANGIOGRAPHY Right 11/02/2019   Procedure: Lower Extremity Angiography;  Surgeon: Katha Cabal, MD;  [facility];  Service: Cardiovascular;  Laterality: Right;   ORIF ANKLE FRACTURE Right 09/02/2019   Procedure: OPEN REDUCTION INTERNAL FIXATION (ORIF) ANKLE FRACTURE BIMALLEOLAR;  Surgeon: [name], DPM;  Location: [facility] ORS;  Service: Podiatry;  Laterality: Right;   PERIPHERAL VASCULAR THROMBECTOMY Right 03/02/2020   Procedure: PERIPHERAL VASCULAR THROMBECTOMY;  Surgeon: Algernon Huxley, MD;  Location: [facility];  Service: Cardiovascular;  Laterality: Right;   REPLACEMENT TOTAL KNEE Left 03/2010   Dr. Rudene Christians   TOTAL HIP ARTHROPLASTY Left 12/11/2015   Procedure: TOTAL HIP ARTHROPLASTY ANTERIOR APPROACH;  Surgeon: Hessie Knows, MD;  Location: [facility] OR[facility];  Service: Orthopedics;  Laterality: Left;   TOTAL KNEE ARTHROPLASTY Right 03/11/2016   Procedure: TOTAL KNEE ARTHROPLASTY;  Surgeon: Hessie Knows, MD;  Location: [facility];  Service: Orthopedics;  Laterality: Right;    Family History  Problem Relation Age of Onset   Diabetes Mother    Hyperlipidemia Mother    Hypertension Mother    Diabetes Father    Hyperlipidemia Father    Hypertension Father    Obesity Father    Hypertension Sister    Hyperlipidemia Sister    Hyperlipidemia Sister    Breast cancer Neg Hx     Social History   Tobacco Use   Smoking status: Never   Smokeless tobacco: Never  Substance Use Topics   Alcohol use: No    Alcohol/week: 0.0 standard drinks     Current Outpatient Medications:    acetaminophen (TYLENOL) 650 MG CR tablet, Take 650-1,300 mg by mouth 2 (two) times daily as needed for pain., Disp: , Rfl:    amiodarone (PACERONE) 200 MG tablet, Take 200 mg by mouth 2 (two) times daily., Disp: , Rfl:    ASPIRIN LOW DOSE 81 MG EC tablet, TAKE 1 TABLET BY MOUTH EVERY  DAY, Disp: 90 tablet, Rfl: 0   atorvastatin (LIPITOR) 40 MG tablet, Take 1 tablet (40 mg total) by mouth every evening., Disp: 90 tablet, Rfl: 1   Calcium Carbonate-Vitamin D 600-400 MG-UNIT tablet, Take 1 tablet by mouth 2 (two) times daily., Disp: 60 tablet, Rfl: 0   doxycycline (VIBRAMYCIN) 100 MG capsule, Take 100 mg by mouth 2 (two) times daily., Disp: , Rfl:    fluticasone (FLONASE) 50 MCG/ACT nasal spray, SPRAY 2 SPRAYS INTO EACH NOSTRIL EVERY DAY, Disp: 48 mL, Rfl: 0   ketoconazole (NIZORAL) 2 % cream, Apply 1 application topically 2 (two) times daily as needed (rash). , Disp: , Rfl:    latanoprost (XALATAN) 0.005 % ophthalmic solution, SMARTSIG:In Eye(s), Disp: , Rfl:    Magnesium Oxide 500 MG CAPS, Take 500 mg by mouth daily. , Disp: , Rfl:    metoprolol succinate (TOPROL-XL) 25 MG 24 hr tablet, TAKE 1 TABLET (25 MG TOTAL) BY MOUTH DAILY., Disp: 90 tablet, Rfl: 1  mirabegron ER (MYRBETRIQ) 50 MG TB24 tablet, Take 1 tablet (50 mg total) by mouth daily., Disp: 90 tablet, Rfl: 3   oxyCODONE (OXY IR/ROXICODONE) 5 MG immediate release tablet, Take 1 tablet (5 mg total) by mouth every 4 (four) hours as needed., Disp: 15 tablet, Rfl: 0   polyethylene glycol (MIRALAX / GLYCOLAX) 17 g packet, Take 17 g by mouth daily., Disp: 14 each, Rfl: 0   potassium chloride (KLOR-CON) 10 MEQ tablet, Take 10 mEq by mouth daily., Disp: , Rfl:    SIMBRINZA 1-0.2 % SUSP, Place 1 drop into both eyes 2 (two) times daily., Disp: , Rfl:    sodium chloride (OCEAN) 0.65 % SOLN nasal spray, Place 1 spray into both nostrils 3 (three) times daily., Disp: 30 mL, Rfl: 0   traZODone (DESYREL) 50 MG tablet, Take 0.5-1 tablets (25-50 mg total) by mouth at bedtime as needed for sleep., Disp: 90 tablet, Rfl: 1   levothyroxine (SYNTHROID) 25 MCG tablet, TAKE 1 TABLET (25 MCG TOTAL) BY MOUTH DAILY AT 6 (SIX) AM., Disp: 90 tablet, Rfl: 1   torsemide (DEMADEX) 20 MG tablet, Take 1 tablet (20 mg total) by mouth daily., Disp: 90  tablet, Rfl: 1  Allergies  Allergen Reactions   Lisinopril Swelling    I personally reviewed active problem list, medication list, allergies, family history, social history, health maintenance with the patient/caregiver today.   ROS  Constitutional: Negative for fever or weight change.  Respiratory: Negative for cough and shortness of breath.   Cardiovascular: Negative for chest pain or palpitations.  Gastrointestinal: Negative for abdominal pain, no bowel changes.  Musculoskeletal: positive  for gait problem and joint swelling.  Skin: Negative for rash.  Neurological: Negative for dizziness or headache.  No other specific complaints in a complete review of systems (except as listed in HPI above).   Objective  Vitals:   07/23/21 1509  BP: 132/80  Pulse: 68  Resp: 16  Temp: 98.1 F (36.7 C)  SpO2: 99%  Weight: 283 lb (128.4 kg)  Height: _0  (1.803 m)    Body mass index is 39.47 kg/m.  Physical Exam  Constitutional: Patient appears well-developed  Obese  No distress.  HEENT: head atraumatic, normocephalic, pupils equal and reactive to light neck supple, Cardiovascular: Normal rate, regular rhythm and normal heart sounds.  No murmur heard. No BLE edema. Pulmonary/Chest: Effort normal and , coarse crackles on base ( discussed possible atelectasis and importance of deep inspiration)  No respiratory distress. Abdominal: Soft.  There is no tenderness. Psychiatric: Patient has a normal mood and affect. behavior is normal. Judgment and thought content normal.  Muscular skeletal: sitting on her wheelchair, left hip is forward and thigh larger on left than right side   Recent Results (from the past 2160 hour(s))  POCT HgB A1C     Status: None   Collection Time: 06/07/21 10:12 AM  Result Value Ref Range   Hemoglobin A1C 5.5 4.0 - 5.6 %   HbA1c POC (<> result, manual entry)     HbA1c, POC (prediabetic range)     HbA1c, POC (controlled diabetic range)    COMPLETE  METABOLIC PANEL WITH GFR     Status: Abnormal   Collection Time: 06/07/21 11:18 AM  Result Value Ref Range   Glucose, Bld 96 65 - 99 mg/dL    Comment: .            Fasting reference interval .    BUN 11 7 - 25 mg/dL  Creat 1.12 (H) 0.60 - 1.00 mg/dL   eGFR 51 (L) > OR = 60 mL/min/1.73m    Comment: The eGFR is based on the CKD-EPI 2021 equation. To calculate  the new eGFR from a previous Creatinine or Cystatin C result, go to https://www.kidney.org/professionals/ kdoqi/gfr%5Fcalculator    BUN/Creatinine Ratio 10 6 - 22 (calc)   Sodium 140 135 - 146 mmol/L   Potassium 4.4 3.5 - 5.3 mmol/L   Chloride 103 98 - 110 mmol/L   CO2 30 20 - 32 mmol/L   Calcium 10.1 8.6 - 10.4 mg/dL   Total Protein 8.0 6.1 - 8.1 g/dL   Albumin 4.4 3.6 - 5.1 g/dL   Globulin 3.6 1.9 - 3.7 g/dL (calc)   AG Ratio 1.2 1.0 - 2.5 (calc)   Total Bilirubin 0.8 0.2 - 1.2 mg/dL   Alkaline phosphatase (APISO) 111 37 - 153 U/L   AST 21 10 - 35 U/L   ALT 15 6 - 29 U/L  Iron, TIBC and Ferritin Panel     Status: None   Collection Time: 06/07/21 11:18 AM  Result Value Ref Range   Iron 46 45 - 160 mcg/dL   TIBC 251 250 - 450 mcg/dL (calc)   %SAT 18 16 - 45 % (calc)   Ferritin 99 16 - 288 ng/mL  Parathyroid hormone, intact (no Ca)     Status: None   Collection Time: 06/07/21 11:18 AM  Result Value Ref Range   PTH 44 16 - 77 pg/mL    Comment: . Interpretive Guide    Intact PTH           Calcium ------------------    ----------           ------- Normal Parathyroid    Normal               Normal Hypoparathyroidism    Low or Low Normal    Low Hyperparathyroidism    Primary            Normal or High       High    Secondary          High                 Normal or Low    Tertiary           High                 High Non-Parathyroid    Hypercalcemia      Low or Low Normal    High .   Cologuard     Status: None   Collection Time: 06/13/21  5:20 PM  Result Value Ref Range   Cologuard Negative Negative    Comment:   NEGATIVE TEST RESULT. A negative Cologuard result indicates a low likelihood that a colorectal cancer (CRC) or advanced adenoma (adenomatous polyps with more advanced pre-malignant features)  is present. The chance that a person with a negative Cologuard test has a colorectal cancer is less than 1 in 1500 (negative predictive value >99.9%) or has an  advanced adenoma is less than  5.3% (negative predictive value 94.7%). These data are based on a prospective cross-sectional study of 10,000 individuals at average risk for colorectal cancer who were screened with both Cologuard and colonoscopy. (Imperiale T. et al, N Engl J Med 2014;370(14):1286-1297) The normal value (reference range) for this assay is negative.  COLOGUARD RE-SCREENING RECOMMENDATION: Periodic colorectal cancer screening is an important part of preventive  healthcare for asymptomatic individuals at average risk for colorectal cancer.  Following a negative Cologuard result, the [place] Task Force screening guidelines recommend a Cologuard re-screening interval of 3 years.  References: American Cancer Society Guideline for Colorectal Cancer Screening: https://www.cancer.org/cancer/colon-rectal-cancer/detection-diagnosis-staging/acs-recommendations.html.; Rex DK, Boland CR, Dominitz JK, Colorectal Cancer Screening: Recommendations for Physicians and Patients from the [place] Task Force on Colorectal Cancer Screening , Am J Gastroenterology 2017; 131:4388-8757.  TEST DESCRIPTION: Composite algorithmic analysis of stool DNA-biomarkers with hemoglobin immunoassay.   Quantitative values of individual biomarkers are not reportable and are not associated with individual biomarker result reference ranges. Cologuard is intended for colorectal cancer screening of adults of either sex, 12 years or older, who are at average-risk for colorectal cancer (CRC). Cologuard has been approved for use by the U.S. FDA.  The performance of Cologuard was  established in a cross sectional study of average-risk adults aged 41-84. Cologuard performance in patients ages 73 to 43 years was estimated by sub-group analysis of near-age groups. Colonoscopies performed for a positive result may find as the most clinically significant lesion: colorectal cancer [4.0%], advanced adenoma (including sessile serrated polyps greater than or equal to 1cm diameter) [20%] or non- advanced adenoma [31%]; or no colorectal neoplasia [45%]. These estimates are derived from a prospective cross-sectional screening study of 10,000 individuals at average risk for colorectal cancer who were screened with both Cologuard and colonoscopy. (Imperiale T. et al, Alison Stalling J Med 2014;370(14):1286-1297.) Cologuard may produce a false negative or false positive result (no colorectal cancer or precancerous polyp present at colonoscopy follow up). A negative Cologuard test result does not guarantee the absence of CRC or advanced adenoma (pre-cancer). The current Cologuard  screening interval is every 3 years. Paramedic and [employer] developer). Cologuard performance data in a 10,000 patient pivotal study using colonoscopy as the reference method can be accessed at the following location: www.exactlabs.com/results. Additional description of the Cologuard test process, warnings and precautions can be found at www.cologuard.com.   Urinalysis, Complete     Status: Abnormal   Collection Time: 06/28/21 10:15 AM  Result Value Ref Range   Specific Gravity, UA >1.030 (H) 1.005 - 1.030   pH, UA 5.0 5.0 - 7.5   Color, UA Yellow Yellow   Appearance Ur Clear Clear   Leukocytes,UA Negative Negative   Protein,UA Negative Negative/Trace   Glucose, UA Negative Negative   Ketones, UA Trace (A) Negative   RBC, UA Negative Negative   Bilirubin, UA Negative Negative   Urobilinogen, Ur 0.2 0.2 - 1.0 mg/dL   Nitrite, UA Negative Negative   Microscopic  Examination See below:   Microscopic Examination     Status: Abnormal   Collection Time: 06/28/21 10:15 AM   Urine  Result Value Ref Range   WBC, UA None seen 0 - 5 /hpf   RBC 0-2 0 - 2 /hpf   Epithelial Cells (non renal) 0-10 0 - 10 /hpf   Mucus, UA Present (A) Not Estab.   Bacteria, UA Few (A) None seen/Few  Bladder Scan (Post Void Residual) in office     Status: None   Collection Time: 06/28/21 10:31 AM  Result Value Ref Range   Scan Result 0   Comprehensive metabolic panel     Status: Abnormal   Collection Time: 07/05/21  9:36 AM  Result Value Ref Range   Sodium 137 135 - 145 mmol/L   Potassium 4.0 3.5 - 5.1 mmol/L  Chloride 98 98 - 111 mmol/L   CO2 30 22 - 32 mmol/L   Glucose, Bld 101 (H) 70 - 99 mg/dL    Comment: Glucose reference range applies only to samples taken after fasting for at least 8 hours.   BUN 21 8 - 23 mg/dL   Creatinine, Ser 1.29 (H) 0.44 - 1.00 mg/dL   Calcium 9.6 8.9 - 10.3 mg/dL   Total Protein 8.6 (H) 6.5 - 8.1 g/dL   Albumin 4.3 3.5 - 5.0 g/dL   AST 25 15 - 41 U/L   ALT 18 0 - 44 U/L   Alkaline Phosphatase 109 38 - 126 U/L   Total Bilirubin 1.0 0.3 - 1.2 mg/dL   GFR, Estimated 43 (L) >60 mL/min    Comment: (NOTE) Calculated using the CKD-EPI Creatinine Equation (2021)    Anion gap 9 5 - 15    Comment: Performed at [facility], [address] [place], [place] [address]  CBC with Differential/Platelet     Status: Abnormal   Collection Time: 07/05/21  9:36 AM  Result Value Ref Range   WBC 4.5 4.0 - 10.5 K/uL   RBC 4.21 3.87 - 5.11 MIL/uL   Hemoglobin 12.9 12.0 - 15.0 g/dL   HCT 40.6 36.0 - 46.0 %   MCV 96.4 80.0 - 100.0 fL   MCH 30.6 26.0 - 34.0 pg   MCHC 31.8 30.0 - 36.0 g/dL   RDW 15.7 (H) 11.5 - 15.5 %   Platelets 178 150 - 400 K/uL   nRBC 0.0 0.0 - 0.2 %   Neutrophils Relative % 64 %   Neutro Abs 2.9 1.7 - 7.7 K/uL   Lymphocytes Relative 22 %   Lymphs Abs 1.0 0.7 - 4.0 K/uL   Monocytes Relative 9 %   Monocytes Absolute 0.4  0.1 - 1.0 K/uL   Eosinophils Relative 4 %   Eosinophils Absolute 0.2 0.0 - 0.5 K/uL   Basophils Relative 1 %   Basophils Absolute 0.0 0.0 - 0.1 K/uL   Immature Granulocytes 0 %   Abs Immature Granulocytes 0.01 0.00 - 0.07 K/uL    Comment: Performed at [facility], [address] [place], [place] [address]  Basic metabolic panel     Status: Abnormal   Collection Time: 07/15/21 10:51 AM  Result Value Ref Range   Sodium 137 135 - 145 mmol/L   Potassium 4.5 3.5 - 5.1 mmol/L   Chloride 105 98 - 111 mmol/L   CO2 22 22 - 32 mmol/L   Glucose, Bld 106 (H) 70 - 99 mg/dL    Comment: Glucose reference range applies only to samples taken after fasting for at least 8 hours.   BUN 32 (H) 8 - 23 mg/dL   Creatinine, Ser 3.11 (H) 0.44 - 1.00 mg/dL   Calcium 9.0 8.9 - 10.3 mg/dL   GFR, Estimated 15 (L) >60 mL/min    Comment: (NOTE) Calculated using the CKD-EPI Creatinine Equation (2021)    Anion gap 10 5 - 15    Comment: Performed at [facility], [address] [place], [place] [address]  CBC     Status: Abnormal   Collection Time: 07/15/21 10:51 AM  Result Value Ref Range   WBC 13.4 (H) 4.0 - 10.5 K/uL   RBC 4.22 3.87 - 5.11 MIL/uL   Hemoglobin 12.9 12.0 - 15.0 g/dL   HCT 41.1 36.0 - 46.0 %   MCV 97.4 80.0 - 100.0 fL   MCH 30.6 26.0 -  34.0 pg   MCHC 31.4 30.0 - 36.0 g/dL   RDW 16.2 (H) 11.5 - 15.5 %   Platelets 157 150 - 400 K/uL   nRBC 0.0 0.0 - 0.2 %    Comment: Performed at [facility], [address] [place], [place] [address]  Brain natriuretic peptide     Status: None   Collection Time: 07/15/21 10:51 AM  Result Value Ref Range   B Natriuretic Peptide 78.7 0.0 - 100.0 pg/mL    Comment: Performed at [facility], [address] [place], [place] [address]  Resp Panel by RT-PCR (Flu A&B, Covid) Nasopharyngeal Swab     Status: None   Collection Time: 07/15/21  2:04 PM   Specimen: Nasopharyngeal Swab; Nasopharyngeal(NP) swabs in vial transport  medium  Result Value Ref Range   SARS Coronavirus 2 by RT PCR NEGATIVE NEGATIVE    Comment: (NOTE) SARS-CoV-2 target nucleic acids are NOT DETECTED.  The SARS-CoV-2 RNA is generally detectable in upper respiratory specimens during the acute phase of infection. The lowest concentration of SARS-CoV-2 viral copies this assay can detect is 138 copies/mL. A negative result does not preclude SARS-Cov-2 infection and should not be used as the sole basis for treatment or other patient management decisions. A negative result may occur with  improper specimen collection/handling, submission of specimen other than nasopharyngeal swab, presence of viral mutation(s) within the areas targeted by this assay, and inadequate number of viral copies(<138 copies/mL). A negative result must be combined with clinical observations, patient history, and epidemiological information. The expected result is Negative.  Fact Sheet for Patients:  EntrepreneurPulse.com.au  Fact Sheet for Healthcare Providers:  IncredibleEmployment.be  This test is no t yet approved or cleared by the [place] FDA and  has been authorized for detection and/or diagnosis of SARS-CoV-2 by FDA under an Emergency Use Authorization (EUA). This EUA will remain  in effect (meaning this test can be used) for the duration of the COVID-19 declaration under Section 564(b)(1) of the Act, 21 U.S.C.section 360bbb-3(b)(1), unless the authorization is terminated  or revoked sooner.       Influenza A by PCR NEGATIVE NEGATIVE   Influenza B by PCR NEGATIVE NEGATIVE    Comment: (NOTE) The Xpert Xpress SARS-CoV-2/FLU/RSV plus assay is intended as an aid in the diagnosis of influenza from Nasopharyngeal swab specimens and should not be used as a sole basis for treatment. Nasal washings and aspirates are unacceptable for Xpert Xpress SARS-CoV-2/FLU/RSV testing.  Fact Sheet for  Patients: EntrepreneurPulse.com.au  Fact Sheet for Healthcare Providers: IncredibleEmployment.be  This test is not yet approved or cleared by the [place] FDA and has been authorized for detection and/or diagnosis of SARS-CoV-2 by FDA under an Emergency Use Authorization (EUA). This EUA will remain in effect (meaning this test can be used) for the duration of the COVID-19 declaration under Section 564(b)(1) of the Act, 21 U.S.C. section 360bbb-3(b)(1), unless the authorization is terminated or revoked.  Performed at [facility], [address] [place], [place] [address]   Lactic acid, plasma     Status: Abnormal   Collection Time: 07/15/21  6:08 PM  Result Value Ref Range   Lactic Acid, Venous 2.3 (HH) 0.5 - 1.9 mmol/L    Comment: CRITICAL RESULT CALLED TO, READ BACK BY AND VERIFIED WITH JAYNE RYAN AT 1909 ON 07/15/21 BY SS Performed at [facility], [address], [place], [place] [address]   Protime-INR     Status: Abnormal  Collection Time: 07/15/21  6:08 PM  Result Value Ref Range   Prothrombin Time 15.5 (H) 11.4 - 15.2 seconds   INR 1.2 0.8 - 1.2    Comment: (NOTE) INR goal varies based on device and disease states. Performed at [facility], [address] [place], [place] [address]   APTT     Status: None   Collection Time: 07/15/21  6:08 PM  Result Value Ref Range   aPTT 29 24 - 36 seconds    Comment: Performed at [facility], [address] [place], [place] [address]  Blood culture (routine single)     Status: None   Collection Time: 07/15/21  6:08 PM   Specimen: BLOOD  Result Value Ref Range   Specimen Description BLOOD RIGHT ANTECUBITAL    Special Requests      BOTTLES DRAWN AEROBIC AND ANAEROBIC Blood Culture adequate volume   Culture      NO GROWTH 5 DAYS Performed at [facility], [address], [place], [place] [address]    Report Status 07/20/2021 FINAL    Hepatic function panel     Status: Abnormal   Collection Time: 07/15/21  6:08 PM  Result Value Ref Range   Total Protein 7.1 6.5 - 8.1 g/dL   Albumin 3.3 (L) 3.5 - 5.0 g/dL   AST 46 (H) 15 - 41 U/L   ALT 41 0 - 44 U/L   Alkaline Phosphatase 118 38 - 126 U/L   Total Bilirubin 2.0 (H) 0.3 - 1.2 mg/dL   Bilirubin, Direct 0.8 (H) 0.0 - 0.2 mg/dL   Indirect Bilirubin 1.2 (H) 0.3 - 0.9 mg/dL    Comment: Performed at [facility], [place], [place] [address]  Troponin I (High Sensitivity)     Status: None   Collection Time: 07/15/21  6:08 PM  Result Value Ref Range   Troponin I (High Sensitivity) 15 <18 ng/L    Comment: (NOTE) Elevated high sensitivity troponin I (hsTnI) values and significant  changes across serial measurements may suggest ACS but many other  chronic and acute conditions are known to elevate hsTnI results.  Refer to the "Links" section for chest pain algorithms and additional  guidance. Performed at [facility], [address] [place], [place] [address]   Lactic acid, plasma     Status: Abnormal   Collection Time: 07/15/21  8:19 PM  Result Value Ref Range   Lactic Acid, Venous 2.2 (HH) 0.5 - 1.9 mmol/L    Comment: CRITICAL VALUE NOTED. VALUE IS CONSISTENT WITH PREVIOUSLY REPORTED/CALLED VALUE SS Performed at [facility], [address] [place], [place] [address]   Blood culture (routine x 2)     Status: None   Collection Time: 07/16/21  1:04 AM   Specimen: BLOOD  Result Value Ref Range   Specimen Description BLOOD RIGHT ASSIST CONTROL    Special Requests      BOTTLES DRAWN AEROBIC AND ANAEROBIC Blood Culture results may not be optimal due to an inadequate volume of blood received in culture bottles   Culture      NO GROWTH 5 DAYS Performed at [facility], [address] [place], [place] [address]    Report Status 07/21/2021 FINAL   Lactic acid, plasma     Status: Abnormal   Collection Time: 07/16/21   1:13 AM  Result Value Ref Range   Lactic Acid, Venous 3.3 (HH) 0.5 - 1.9 mmol/L    Comment: CRITICAL  RESULT CALLED TO, READ BACK BY AND VERIFIED WITH Megan Mans RN 1909 07/15/21 HNM Performed at [facility], [address] [place], [place] [address]   Resp Panel by RT-PCR (Flu A&B, Covid) Nasopharyngeal Swab     Status: None   Collection Time: 07/16/21  1:21 AM   Specimen: Nasopharyngeal Swab; Nasopharyngeal(NP) swabs in vial transport medium  Result Value Ref Range   SARS Coronavirus 2 by RT PCR NEGATIVE NEGATIVE    Comment: (NOTE) SARS-CoV-2 target nucleic acids are NOT DETECTED.  The SARS-CoV-2 RNA is generally detectable in upper respiratory specimens during the acute phase of infection. The lowest concentration of SARS-CoV-2 viral copies this assay can detect is 138 copies/mL. A negative result does not preclude SARS-Cov-2 infection and should not be used as the sole basis for treatment or other patient management decisions. A negative result may occur with  improper specimen collection/handling, submission of specimen other than nasopharyngeal swab, presence of viral mutation(s) within the areas targeted by this assay, and inadequate number of viral copies(<138 copies/mL). A negative result must be combined with clinical observations, patient history, and epidemiological information. The expected result is Negative.  Fact Sheet for Patients:  EntrepreneurPulse.com.au  Fact Sheet for Healthcare Providers:  IncredibleEmployment.be  This test is no t yet approved or cleared by the [place] FDA and  has been authorized for detection and/or diagnosis of SARS-CoV-2 by FDA under an Emergency Use Authorization (EUA). This EUA will remain  in effect (meaning this test can be used) for the duration of the COVID-19 declaration under Section 564(b)(1) of the Act, 21 U.S.C.section 360bbb-3(b)(1), unless the authorization is  terminated  or revoked sooner.       Influenza A by PCR NEGATIVE NEGATIVE   Influenza B by PCR NEGATIVE NEGATIVE    Comment: (NOTE) The Xpert Xpress SARS-CoV-2/FLU/RSV plus assay is intended as an aid in the diagnosis of influenza from Nasopharyngeal swab specimens and should not be used as a sole basis for treatment. Nasal washings and aspirates are unacceptable for Xpert Xpress SARS-CoV-2/FLU/RSV testing.  Fact Sheet for Patients: EntrepreneurPulse.com.au  Fact Sheet for Healthcare Providers: IncredibleEmployment.be  This test is not yet approved or cleared by the [place] FDA and has been authorized for detection and/or diagnosis of SARS-CoV-2 by FDA under an Emergency Use Authorization (EUA). This EUA will remain in effect (meaning this test can be used) for the duration of the COVID-19 declaration under Section 564(b)(1) of the Act, 21 U.S.C. section 360bbb-3(b)(1), unless the authorization is terminated or revoked.  Performed at [facility], [address] [place], [place] [address]   Urinalysis, Complete w Microscopic Urine, Catheterized     Status: Abnormal   Collection Time: 07/16/21  1:30 AM  Result Value Ref Range   Color, Urine AMBER (A) YELLOW    Comment: BIOCHEMICALS MAY BE AFFECTED BY COLOR   APPearance CLOUDY (A) CLEAR   Specific Gravity, Urine 1.031 (H) 1.005 - 1.030   pH 5.0 5.0 - 8.0   Glucose, UA NEGATIVE NEGATIVE mg/dL   Hgb urine dipstick NEGATIVE NEGATIVE   Bilirubin Urine NEGATIVE NEGATIVE   Ketones, ur 5 (A) NEGATIVE mg/dL   Protein, ur 30 (A) NEGATIVE mg/dL   Nitrite NEGATIVE NEGATIVE   Leukocytes,Ua NEGATIVE NEGATIVE   RBC / HPF 11-20 0 - 5 RBC/hpf   WBC, UA 11-20 0 - 5 WBC/hpf   Bacteria, UA NONE SEEN NONE SEEN   Squamous Epithelial / LPF 6-10 0 - 5  Mucus PRESENT    Budding Yeast PRESENT     Comment: Performed at [facility], [address] [place], [place] [address]   Urine Culture     Status: None   Collection Time: 07/16/21  1:30 AM   Specimen: Urine, Random  Result Value Ref Range   Specimen Description      URINE, RANDOM Performed at [facility], [address] [place], [place] [address]    Special Requests      NONE Performed at [facility], [address], [place], [place] [address]    Culture      NO GROWTH Performed at [facility], [address], [place], [place] [address]    Report Status 07/16/2021 FINAL   Blood gas, arterial     Status: Abnormal   Collection Time: 07/16/21  4:21 AM  Result Value Ref Range   FIO2 ROOM AIR    pH, Arterial 7.34 (L) 7.350 - 7.450   pCO2 arterial 35 32.0 - 48.0 mmHg   pO2, Arterial 70 (L) 83.0 - 108.0 mmHg   Bicarbonate 18.9 (L) 20.0 - 28.0 mmol/L   Acid-base deficit 6.1 (H) 0.0 - 2.0 mmol/L   O2 Saturation 92.7 %   Patient temperature 37.0    Collection site RIGHT RADIAL    Sample type ARTERIAL DRAW    Allens test (pass/fail) PASS PASS    Comment: Performed at [facility], [address] [place], [place] [address]  Lactic acid, plasma     Status: Abnormal   Collection Time: 07/16/21  6:33 AM  Result Value Ref Range   Lactic Acid, Venous 2.1 (HH) 0.5 - 1.9 mmol/L    Comment: CRITICAL VALUE NOTED. VALUE IS CONSISTENT WITH PREVIOUSLY REPORTED/CALLED VALUE/MW/HKP Performed at [facility], [address] [place], [place] [address]   Creatinine, serum     Status: Abnormal   Collection Time: 07/16/21  6:33 AM  Result Value Ref Range   Creatinine, Ser 3.47 (H) 0.44 - 1.00 mg/dL   GFR, Estimated 13 (L) >60 mL/min    Comment: (NOTE) Calculated using the CKD-EPI Creatinine Equation (2021) Performed at [facility], [address] [place], [place] [address]   Procalcitonin     Status: None   Collection Time: 07/16/21  6:33 AM  Result Value Ref Range   Procalcitonin 1.28 ng/mL    Comment:        Interpretation: PCT > 0.5 ng/mL and  <= 2 ng/mL: Systemic infection (sepsis) is possible, but other conditions are known to elevate PCT as well. (NOTE)       Sepsis PCT Algorithm           Lower Respiratory Tract                                      Infection PCT Algorithm    ----------------------------     ----------------------------         PCT < 0.25 ng/mL                PCT < 0.10 ng/mL          Strongly encourage             Strongly discourage   discontinuation of antibiotics    initiation of antibiotics    ----------------------------     -----------------------------       PCT 0.25 -  0.50 ng/mL            PCT 0.10 - 0.25 ng/mL               OR       >80% decrease in PCT            Discourage initiation of                                            antibiotics      Encourage discontinuation           of antibiotics    ----------------------------     -----------------------------         PCT >= 0.50 ng/mL              PCT 0.26 - 0.50 ng/mL                AND       <80% decrease in PCT             Encourage initiation of                                             antibiotics       Encourage continuation           of antibiotics    ----------------------------     -----------------------------        PCT >= 0.50 ng/mL                  PCT > 0.50 ng/mL               AND         increase in PCT                  Strongly encourage                                      initiation of antibiotics    Strongly encourage escalation           of antibiotics                                     -----------------------------                                           PCT <= 0.25 ng/mL                                                 OR                                        > 80% decrease in PCT  Discontinue / Do not initiate                                             antibiotics  Performed at [facility], [address] [place], [place] [address]   CBC     Status:  Abnormal   Collection Time: 07/16/21  6:33 AM  Result Value Ref Range   WBC 13.5 (H) 4.0 - 10.5 K/uL   RBC 4.17 3.87 - 5.11 MIL/uL   Hemoglobin 13.4 12.0 - 15.0 g/dL   HCT 40.4 36.0 - 46.0 %   MCV 96.9 80.0 - 100.0 fL   MCH 32.1 26.0 - 34.0 pg   MCHC 33.2 30.0 - 36.0 g/dL   RDW 16.4 (H) 11.5 - 15.5 %   Platelets 150 150 - 400 K/uL   nRBC 0.0 0.0 - 0.2 %    Comment: Performed at [facility], [address], [place], [place] [address]  Magnesium     Status: None   Collection Time: 07/16/21  6:33 AM  Result Value Ref Range   Magnesium 2.3 1.7 - 2.4 mg/dL    Comment: Performed at [facility], [address] [place], [place] [address]  Phosphorus     Status: None   Collection Time: 07/16/21  6:33 AM  Result Value Ref Range   Phosphorus 3.3 2.5 - 4.6 mg/dL    Comment: Performed at [facility], [address] [place], [place] [address]  Blood culture (routine x 2)     Status: None   Collection Time: 07/16/21  6:44 AM   Specimen: BLOOD LEFT HAND  Result Value Ref Range   Specimen Description BLOOD LEFT HAND    Special Requests      BOTTLES DRAWN AEROBIC AND ANAEROBIC Blood Culture adequate volume   Culture      NO GROWTH 5 DAYS Performed at [facility], [address] [place], [place] [address]    Report Status 07/21/2021 FINAL   CBG monitoring, ED     Status: None   Collection Time: 07/16/21  7:29 AM  Result Value Ref Range   Glucose-Capillary 98 70 - 99 mg/dL    Comment: Glucose reference range applies only to samples taken after fasting for at least 8 hours.  MRSA Next Gen by PCR, Nasal     Status: Abnormal   Collection Time: 07/16/21  8:00 AM   Specimen: Nasal Mucosa; Nasal Swab  Result Value Ref Range   MRSA by PCR Next Gen DETECTED (A) NOT DETECTED    Comment: RESULT CALLED TO, READ BACK BY AND VERIFIED WITH: SHEIKA BAYNES 07/16/21 1004 KLW (NOTE) The GeneXpert MRSA Assay (FDA approved for NASAL specimens only), is one  component of a comprehensive MRSA colonization surveillance program. It is not intended to diagnose MRSA infection nor to guide or monitor treatment for MRSA infections. Test performance is not FDA approved in patients less than 37 years old. Performed at [facility], [address] [place], [place] [address]   Glucose, capillary     Status: None   Collection Time: 07/16/21  8:02 AM  Result Value Ref Range   Glucose-Capillary 90 70 - 99 mg/dL    Comment: Glucose reference range applies only to samples taken after fasting for at least 8 hours.  Glucose, capillary     Status: None  Collection Time: 07/16/21 11:30 AM  Result Value Ref Range   Glucose-Capillary 91 70 - 99 mg/dL    Comment: Glucose reference range applies only to samples taken after fasting for at least 8 hours.  Troponin I (High Sensitivity)     Status: Abnormal   Collection Time: 07/16/21  4:05 PM  Result Value Ref Range   Troponin I (High Sensitivity) 21 (H) <18 ng/L    Comment: (NOTE) Elevated high sensitivity troponin I (hsTnI) values and significant  changes across serial measurements may suggest ACS but many other  chronic and acute conditions are known to elevate hsTnI results.  Refer to the "Links" section for chest pain algorithms and additional  guidance. Performed at [facility], [address] [place], [place] [address]   Glucose, capillary     Status: None   Collection Time: 07/16/21  4:18 PM  Result Value Ref Range   Glucose-Capillary 91 70 - 99 mg/dL    Comment: Glucose reference range applies only to samples taken after fasting for at least 8 hours.  Glucose, capillary     Status: Abnormal   Collection Time: 07/16/21  7:43 PM  Result Value Ref Range   Glucose-Capillary 100 (H) 70 - 99 mg/dL    Comment: Glucose reference range applies only to samples taken after fasting for at least 8 hours.  Glucose, capillary     Status: Abnormal   Collection Time: 07/17/21  2:48 AM   Result Value Ref Range   Glucose-Capillary 114 (H) 70 - 99 mg/dL    Comment: Glucose reference range applies only to samples taken after fasting for at least 8 hours.  Basic metabolic panel     Status: Abnormal   Collection Time: 07/17/21  4:20 AM  Result Value Ref Range   Sodium 136 135 - 145 mmol/L   Potassium 4.7 3.5 - 5.1 mmol/L   Chloride 109 98 - 111 mmol/L   CO2 21 (L) 22 - 32 mmol/L   Glucose, Bld 114 (H) 70 - 99 mg/dL    Comment: Glucose reference range applies only to samples taken after fasting for at least 8 hours.   BUN 40 (H) 8 - 23 mg/dL   Creatinine, Ser 2.24 (H) 0.44 - 1.00 mg/dL   Calcium 8.3 (L) 8.9 - 10.3 mg/dL   GFR, Estimated 22 (L) >60 mL/min    Comment: (NOTE) Calculated using the CKD-EPI Creatinine Equation (2021)    Anion gap 6 5 - 15    Comment: Performed at [facility], [address], [place], [place] [address]  Magnesium     Status: None   Collection Time: 07/17/21  4:20 AM  Result Value Ref Range   Magnesium 2.2 1.7 - 2.4 mg/dL    Comment: Performed at [facility], [address], [place], [place] [address]  Phosphorus     Status: None   Collection Time: 07/17/21  4:20 AM  Result Value Ref Range   Phosphorus 3.1 2.5 - 4.6 mg/dL    Comment: Performed at [facility], [address] [place],  [address]  CBC with Differential/Platelet     Status: Abnormal   Collection Time: 07/17/21  4:20 AM  Result Value Ref Range   WBC 11.3 (H) 4.0 - 10.5 K/uL   RBC 3.98 3.87 - 5.11 MIL/uL   Hemoglobin 12.9 12.0 - 15.0 g/dL   HCT 38.3 36.0 - 46.0 %   MCV 96.2 80.0 - 100.0 fL   MCH 32.4  26.0 - 34.0 pg   MCHC 33.7 30.0 - 36.0 g/dL   RDW 16.3 (H) 11.5 - 15.5 %   Platelets 125 (L) 150 - 400 K/uL   nRBC 0.0 0.0 - 0.2 %   Neutrophils Relative % 89 %   Neutro Abs 10.1 (H) 1.7 - 7.7 K/uL   Lymphocytes Relative 6 %   Lymphs Abs 0.7 0.7 - 4.0 K/uL   Monocytes Relative 3 %   Monocytes Absolute 0.3 0.1 - 1.0 K/uL    Eosinophils Relative 1 %   Eosinophils Absolute 0.2 0.0 - 0.5 K/uL   Basophils Relative 0 %   Basophils Absolute 0.0 0.0 - 0.1 K/uL   Immature Granulocytes 1 %   Abs Immature Granulocytes 0.06 0.00 - 0.07 K/uL    Comment: Performed at [facility], [address] [place], [place] [address]  Hepatic function panel     Status: Abnormal   Collection Time: 07/17/21  4:20 AM  Result Value Ref Range   Total Protein 6.5 6.5 - 8.1 g/dL   Albumin 2.7 (L) 3.5 - 5.0 g/dL   AST 22 15 - 41 U/L   ALT 29 0 - 44 U/L   Alkaline Phosphatase 100 38 - 126 U/L   Total Bilirubin 1.1 0.3 - 1.2 mg/dL   Bilirubin, Direct 0.3 (H) 0.0 - 0.2 mg/dL   Indirect Bilirubin 0.8 0.3 - 0.9 mg/dL    Comment: Performed at [facility], [address] [place], [place] [address]  Glucose, capillary     Status: Abnormal   Collection Time: 07/17/21  7:21 AM  Result Value Ref Range   Glucose-Capillary 100 (H) 70 - 99 mg/dL    Comment: Glucose reference range applies only to samples taken after fasting for at least 8 hours.  Glucose, capillary     Status: Abnormal   Collection Time: 07/17/21 11:14 AM  Result Value Ref Range   Glucose-Capillary 107 (H) 70 - 99 mg/dL    Comment: Glucose reference range applies only to samples taken after fasting for at least 8 hours.  Synovial cell count + diff, w/ crystals     Status: Abnormal   Collection Time: 07/17/21  3:37 PM  Result Value Ref Range   Color, Synovial RED (A) YELLOW   Appearance-Synovial TURBID (A) CLEAR   Crystals, Fluid NO CRYSTALS SEEN    WBC, Synovial 1,382 (H) 0 - 200 /cu mm   Neutrophil, Synovial 33 %   Lymphocytes-Synovial Fld 54 %   Monocyte-Macrophage-Synovial Fluid 13 %   Eosinophils-Synovial 0 %    Comment: Performed at [facility], [address] [place], [place] [address]  Cytology - Non PAP;     Status: None   Collection Time: 07/17/21  3:47 PM  Result Value Ref Range   CYTOLOGY - NON GYN      CYTOLOGY - NON  PAP CASE: [id number] PATIENT: Shaden Bateson Non-Gynecological Cytology Report     Specimen Submitted: A. Synovium fluid, right hip joint  Clinical History: None provided      DIAGNOSIS: A. SYNOVIAL FLUID, RIGHT HIP; ASPIRATION: - PREDOMINANTLY BLOOD. - NEGATIVE FOR SIGNIFICANT INCREASE IN INFLAMMATORY CELLS. - NEGATIVE FOR MALIGNANCY.   GROSS DESCRIPTION: A. Labeled: Received labeled with the patient's name and date of birth (per requisition synovium fluid, right hip joint) Received: Fresh in 2 containers designated as A1 and A2 Collection time: 3:47 PM on 07/17/2021 Placed into formalin time: Not applicable Volume: A1: Less than 1 mL; A2: Less  than 1 mL Description of fluid and container in which it is received: A1 is a 10 mL clear plastic syringe with a red stopper containing yellow-tan transparent fluid with potential red-pink soft tissue fragments.  A2 is a 5 mL clear plastic syringe with a red stopper containing yellow-tan transp arent fluid with potential red-pink soft tissue fragments Cytospin slide(s) received: Yes, 1  Specimen material submitted for: Cell block and ThinPrep  The cell block material is fixed in formalin for 6 hours prior to processing.  RB 07/19/2021   Final Diagnosis performed by Betsy Pries, MD.   Electronically signed 07/22/2021 12:50:49PM The electronic signature indicates that the named Attending Pathologist has evaluated the specimen Technical component performed at [facility], [address], [place], [place] [address] Lab: [phone] Dir: Rush Farmer, MD, MMM  Professional component performed at [facility], [facility], [address], [place], [place] [address] Lab: [phone] Dir: Kathi Simpers, MD   Anaerobic culture w Gram Stain     Status: None   Collection Time: 07/17/21  3:47 PM   Specimen: PATH Cytology Misc. fluid; Body Fluid  Result Value Ref Range   Specimen Description      FLUID Performed  at [facility], [address] [place], [place] [address]    Special Requests      ASPIRATION OF RIGHT HIP Performed at [facility], [place], [place] [address]    Gram Stain      NO SQUAMOUS EPITHELIAL CELLS SEEN NO WBC SEEN NO ORGANISMS SEEN    Culture      NO ANAEROBES ISOLATED Performed at [facility], [address] [place], [place] [address]    Report Status 07/22/2021 FINAL   Body fluid culture w Gram Stain     Status: None   Collection Time: 07/17/21  3:47 PM   Specimen: PATH Cytology Misc. fluid; Body Fluid  Result Value Ref Range   Specimen Description      FLUID Performed at [facility], [address] [place], [place] [address]    Special Requests      ASPIRATION OF RIGHT HIP Performed at [facility], [place], [place] [address]    Gram Stain      FEW WBC PRESENT, PREDOMINANTLY MONONUCLEAR NO ORGANISMS SEEN SQUAMOUS EPITHELIAL CELLS PRESENT    Culture      NO GROWTH 3 DAYS Performed at [facility], [address] [place], [place] [address]    Report Status 07/21/2021 FINAL   Acid Fast Smear (AFB)     Status: None   Collection Time: 07/17/21  3:47 PM   Specimen: Synovial, Right Hip; Synovial Fluid  Result Value Ref Range   AFB Specimen Processing Concentration    Acid Fast Smear Negative     Comment: (NOTE) Performed At: [facility] [place], [place] [id number] Rush Farmer MD YD:[id number]    Source (AFB) RIGHT HIP ASPIRATION     Comment: Performed at [facility], [address] [place], [place] [address]  Glucose, capillary     Status: Abnormal   Collection Time: 07/17/21  4:36 PM  Result Value Ref Range   Glucose-Capillary 152 (H) 70 - 99 mg/dL    Comment: Glucose reference range applies only to samples taken after fasting for at least 8 hours.  Glucose, capillary     Status: Abnormal   Collection Time: 07/17/21  8:07 PM   Result Value  Ref Range   Glucose-Capillary 102 (H) 70 - 99 mg/dL    Comment: Glucose reference range applies only to samples taken after fasting for at least 8 hours.  CK     Status: None   Collection Time: 07/17/21  9:26 PM  Result Value Ref Range   Total CK 184 38 - 234 U/L    Comment: Performed at [facility], [address] [place], [place] [address]  Glucose, capillary     Status: Abnormal   Collection Time: 07/18/21 12:27 AM  Result Value Ref Range   Glucose-Capillary 123 (H) 70 - 99 mg/dL    Comment: Glucose reference range applies only to samples taken after fasting for at least 8 hours.  Glucose, capillary     Status: Abnormal   Collection Time: 07/18/21  4:00 AM  Result Value Ref Range   Glucose-Capillary 101 (H) 70 - 99 mg/dL    Comment: Glucose reference range applies only to samples taken after fasting for at least 8 hours.  Basic metabolic panel     Status: Abnormal   Collection Time: 07/18/21  5:59 AM  Result Value Ref Range   Sodium 135 135 - 145 mmol/L   Potassium 3.9 3.5 - 5.1 mmol/L   Chloride 108 98 - 111 mmol/L   CO2 20 (L) 22 - 32 mmol/L   Glucose, Bld 110 (H) 70 - 99 mg/dL    Comment: Glucose reference range applies only to samples taken after fasting for at least 8 hours.   BUN 38 (H) 8 - 23 mg/dL   Creatinine, Ser 1.43 (H) 0.44 - 1.00 mg/dL   Calcium 8.4 (L) 8.9 - 10.3 mg/dL   GFR, Estimated 38 (L) >60 mL/min    Comment: (NOTE) Calculated using the CKD-EPI Creatinine Equation (2021)    Anion gap 7 5 - 15    Comment: Performed at [facility], [address] [place], [place] [address]  Magnesium     Status: Abnormal   Collection Time: 07/18/21  5:59 AM  Result Value Ref Range   Magnesium 2.7 (H) 1.7 - 2.4 mg/dL    Comment: Performed at [facility], [address] [place], [place] [address]  Phosphorus     Status: None   Collection Time: 07/18/21  5:59 AM  Result Value Ref Range   Phosphorus 2.6 2.5 - 4.6 mg/dL     Comment: Performed at [facility], [address] [place], [place] [address]  Procalcitonin     Status: None   Collection Time: 07/18/21  5:59 AM  Result Value Ref Range   Procalcitonin 0.44 ng/mL    Comment:        Interpretation: PCT (Procalcitonin) <= 0.5 ng/mL: Systemic infection (sepsis) is not likely. Local bacterial infection is possible. (NOTE)       Sepsis PCT Algorithm           Lower Respiratory Tract                                      Infection PCT Algorithm    ----------------------------     ----------------------------         PCT < 0.25 ng/mL                PCT < 0.10 ng/mL          Strongly encourage  Strongly discourage   discontinuation of antibiotics    initiation of antibiotics    ----------------------------     -----------------------------       PCT 0.25 - 0.50 ng/mL            PCT 0.10 - 0.25 ng/mL               OR       >80% decrease in PCT            Discourage initiation of                                            antibiotics      Encourage discontinuation           of antibiotics    ----------------------------     -----------------------------         PCT >= 0.50 ng/mL              PCT 0.26 - 0.50 ng/mL               AND        <80% decrease in PCT             Encourage initiation of                                             antibiotics       Encourage continuation           of antibiotics    ----------------------------     -----------------------------        PCT >= 0.50 ng/mL                  PCT > 0.50 ng/mL               AND         increase in PCT                  Strongly encourage                                      initiation of antibiotics    Strongly encourage escalation           of antibiotics                                     -----------------------------                                           PCT <= 0.25 ng/mL                                                 OR                                         >  80% decrease in PCT                                      Discontinue / Do not initiate                                             antibiotics  Performed at [facility], [address] [place], [place] [address]   CBC with Differential/Platelet     Status: Abnormal   Collection Time: 07/18/21  5:59 AM  Result Value Ref Range   WBC 8.8 4.0 - 10.5 K/uL   RBC 3.66 (L) 3.87 - 5.11 MIL/uL   Hemoglobin 11.4 (L) 12.0 - 15.0 g/dL   HCT 34.8 (L) 36.0 - 46.0 %   MCV 95.1 80.0 - 100.0 fL   MCH 31.1 26.0 - 34.0 pg   MCHC 32.8 30.0 - 36.0 g/dL   RDW 16.2 (H) 11.5 - 15.5 %   Platelets 116 (L) 150 - 400 K/uL    Comment: Immature Platelet Fraction may be clinically indicated, consider ordering this additional test [id number]    nRBC 0.0 0.0 - 0.2 %   Neutrophils Relative % 84 %   Neutro Abs 7.4 1.7 - 7.7 K/uL   Lymphocytes Relative 11 %   Lymphs Abs 0.9 0.7 - 4.0 K/uL   Monocytes Relative 3 %   Monocytes Absolute 0.3 0.1 - 1.0 K/uL   Eosinophils Relative 1 %   Eosinophils Absolute 0.1 0.0 - 0.5 K/uL   Basophils Relative 0 %   Basophils Absolute 0.0 0.0 - 0.1 K/uL   WBC Morphology MORPHOLOGY UNREMARKABLE    RBC Morphology MORPHOLOGY UNREMARKABLE    Smear Review Normal platelet morphology    Immature Granulocytes 1 %   Abs Immature Granulocytes 0.11 (H) 0.00 - 0.07 K/uL    Comment: Performed at [facility], [address] [place], [place] [address]  Sedimentation rate     Status: Abnormal   Collection Time: 07/18/21  5:59 AM  Result Value Ref Range   Sed Rate 49 (H) 0 - 30 mm/hr    Comment: Performed at [facility], [place], [place] [address]  C-reactive protein     Status: Abnormal   Collection Time: 07/18/21  5:59 AM  Result Value Ref Range   CRP 11.2 (H) <1.0 mg/dL    Comment: Performed at [facility], [address] [place], [place] [address]  CK     Status: None   Collection Time: 07/18/21  5:59 AM  Result Value  Ref Range   Total CK 133 38 - 234 U/L    Comment: Performed at [facility], [address] [place], [place] [address]  Glucose, capillary     Status: Abnormal   Collection Time: 07/18/21  7:29 AM  Result Value Ref Range   Glucose-Capillary 102 (H) 70 - 99 mg/dL    Comment: Glucose reference range applies only to samples taken after fasting for at least 8 hours.  Glucose, capillary     Status: Abnormal   Collection Time: 07/18/21 11:24 AM  Result Value Ref Range   Glucose-Capillary 119 (H) 70 - 99 mg/dL    Comment: Glucose reference range applies only to samples taken after fasting for at least  8 hours.  Glucose, capillary     Status: Abnormal   Collection Time: 07/18/21  3:47 PM  Result Value Ref Range   Glucose-Capillary 100 (H) 70 - 99 mg/dL    Comment: Glucose reference range applies only to samples taken after fasting for at least 8 hours.  Glucose, capillary     Status: None   Collection Time: 07/18/21  7:49 PM  Result Value Ref Range   Glucose-Capillary 97 70 - 99 mg/dL    Comment: Glucose reference range applies only to samples taken after fasting for at least 8 hours.   Comment 1 Notify RN    Comment 2 Document in Chart   Glucose, capillary     Status: None   Collection Time: 07/18/21 11:10 PM  Result Value Ref Range   Glucose-Capillary 95 70 - 99 mg/dL    Comment: Glucose reference range applies only to samples taken after fasting for at least 8 hours.   Comment 1 Notify RN    Comment 2 Document in Chart   Glucose, capillary     Status: None   Collection Time: 07/19/21  3:53 AM  Result Value Ref Range   Glucose-Capillary 81 70 - 99 mg/dL    Comment: Glucose reference range applies only to samples taken after fasting for at least 8 hours.   Comment 1 Notify RN    Comment 2 Document in Chart   Basic metabolic panel     Status: Abnormal   Collection Time: 07/19/21  5:45 AM  Result Value Ref Range   Sodium 136 135 - 145 mmol/L   Potassium 3.5 3.5 - 5.1  mmol/L   Chloride 109 98 - 111 mmol/L   CO2 20 (L) 22 - 32 mmol/L   Glucose, Bld 80 70 - 99 mg/dL    Comment: Glucose reference range applies only to samples taken after fasting for at least 8 hours.   BUN 34 (H) 8 - 23 mg/dL   Creatinine, Ser 1.21 (H) 0.44 - 1.00 mg/dL   Calcium 8.6 (L) 8.9 - 10.3 mg/dL   GFR, Estimated 47 (L) >60 mL/min    Comment: (NOTE) Calculated using the CKD-EPI Creatinine Equation (2021)    Anion gap 7 5 - 15    Comment: Performed at [facility], [address] [place], [place] [address]  Magnesium     Status: Abnormal   Collection Time: 07/19/21  5:45 AM  Result Value Ref Range   Magnesium 2.5 (H) 1.7 - 2.4 mg/dL    Comment: Performed at [facility], [address], [place], [place] [address]  Phosphorus     Status: Abnormal   Collection Time: 07/19/21  5:45 AM  Result Value Ref Range   Phosphorus 2.0 (L) 2.5 - 4.6 mg/dL    Comment: Performed at [facility], [address] [place], [place] [address]  Procalcitonin     Status: None   Collection Time: 07/19/21  5:45 AM  Result Value Ref Range   Procalcitonin 0.21 ng/mL    Comment:        Interpretation: PCT (Procalcitonin) <= 0.5 ng/mL: Systemic infection (sepsis) is not likely. Local bacterial infection is possible. (NOTE)       Sepsis PCT Algorithm           Lower Respiratory Tract  Infection PCT Algorithm    ----------------------------     ----------------------------         PCT < 0.25 ng/mL                PCT < 0.10 ng/mL          Strongly encourage             Strongly discourage   discontinuation of antibiotics    initiation of antibiotics    ----------------------------     -----------------------------       PCT 0.25 - 0.50 ng/mL            PCT 0.10 - 0.25 ng/mL               OR       >80% decrease in PCT            Discourage initiation of                                            antibiotics      Encourage  discontinuation           of antibiotics    ----------------------------     -----------------------------         PCT >= 0.50 ng/mL              PCT 0.26 - 0.50 ng/mL               AND        <80% decrease in PCT             Encourage initiation of                                             antibiotics       Encourage continuation           of antibiotics    ----------------------------     -----------------------------        PCT >= 0.50 ng/mL                  PCT > 0.50 ng/mL               AND         increase in PCT                  Strongly encourage                                      initiation of antibiotics    Strongly encourage escalation           of antibiotics                                     -----------------------------                                           PCT <= 0.25 ng/mL  OR                                        > 80% decrease in PCT                                      Discontinue / Do not initiate                                             antibiotics  Performed at [facility], [address] [place], [place] [address]   CBC with Differential/Platelet     Status: Abnormal   Collection Time: 07/19/21  5:45 AM  Result Value Ref Range   WBC 8.8 4.0 - 10.5 K/uL   RBC 3.44 (L) 3.87 - 5.11 MIL/uL   Hemoglobin 10.8 (L) 12.0 - 15.0 g/dL   HCT 32.7 (L) 36.0 - 46.0 %   MCV 95.1 80.0 - 100.0 fL   MCH 31.4 26.0 - 34.0 pg   MCHC 33.0 30.0 - 36.0 g/dL   RDW 16.3 (H) 11.5 - 15.5 %   Platelets 104 (L) 150 - 400 K/uL    Comment: Immature Platelet Fraction may be clinically indicated, consider ordering this additional test [id number]    nRBC 0.0 0.0 - 0.2 %   Neutrophils Relative % 48 %   Neutro Abs 4.2 1.7 - 7.7 K/uL   Lymphocytes Relative 23 %   Lymphs Abs 2.1 0.7 - 4.0 K/uL   Monocytes Relative 6 %   Monocytes Absolute 0.6 0.1 - 1.0 K/uL   Eosinophils Relative 17 %   Eosinophils Absolute 1.5  (H) 0.0 - 0.5 K/uL   Basophils Relative 1 %   Basophils Absolute 0.1 0.0 - 0.1 K/uL   Immature Granulocytes 5 %   Abs Immature Granulocytes 0.41 (H) 0.00 - 0.07 K/uL    Comment: Performed at [facility], [address] [place], [place] [address]  Glucose, capillary     Status: None   Collection Time: 07/19/21  7:22 AM  Result Value Ref Range   Glucose-Capillary 79 70 - 99 mg/dL    Comment: Glucose reference range applies only to samples taken after fasting for at least 8 hours.  Glucose, capillary     Status: None   Collection Time: 07/19/21 11:08 AM  Result Value Ref Range   Glucose-Capillary 73 70 - 99 mg/dL    Comment: Glucose reference range applies only to samples taken after fasting for at least 8 hours.  Glucose, capillary     Status: None   Collection Time: 07/19/21  7:23 PM  Result Value Ref Range   Glucose-Capillary 90 70 - 99 mg/dL    Comment: Glucose reference range applies only to samples taken after fasting for at least 8 hours.   Comment 1 Notify RN    Comment 2 Document in Chart   Glucose, capillary     Status: None   Collection Time: 07/19/21  9:28 PM  Result Value Ref Range   Glucose-Capillary 79 70 - 99 mg/dL    Comment: Glucose reference range applies only to samples taken after fasting for at least 8 hours.  Magnesium     Status:  None   Collection Time: 07/20/21  4:33 AM  Result Value Ref Range   Magnesium 2.4 1.7 - 2.4 mg/dL    Comment: Performed at [facility], [address] [place], [place] [address]  Phosphorus     Status: Abnormal   Collection Time: 07/20/21  4:33 AM  Result Value Ref Range   Phosphorus 2.2 (L) 2.5 - 4.6 mg/dL    Comment: Performed at [facility], [address] [place], [place] [address]  Glucose, capillary     Status: None   Collection Time: 07/20/21  7:50 AM  Result Value Ref Range   Glucose-Capillary 86 70 - 99 mg/dL    Comment: Glucose reference range applies only to samples taken after  fasting for at least 8 hours.     PHQ2/9: Depression screen [facility] 2/9 07/23/2021 06/21/2021 06/07/2021 02/04/2021 01/01/2021  Decreased Interest 0 0 0 0 0  Down, Depressed, Hopeless 0 0 0 0 0  PHQ - 2 Score 0 0 0 0 0  Altered sleeping - - - - -  Tired, decreased energy - - - - -  Change in appetite - - - - -  Feeling bad or failure about yourself  - - - - -  Trouble concentrating - - - - -  Moving slowly or fidgety/restless - - - - -  Suicidal thoughts - - - - -  PHQ-9 Score - - - - -  Difficult doing work/chores - - - - -  Some recent data might be hidden    phq 9 is negative   Fall Risk: Fall Risk  07/23/2021 06/07/2021 02/04/2021 01/01/2021 11/14/2020  Falls in the past year? - 0 0 0 1  Number falls in past yr: - 0 0 0 1  Comment - - - - -  Injury with Fall? - 0 0 0 0  Comment - - - - -  Risk for fall due to : Impaired mobility;Impaired balance/gait Impaired mobility - - -  Follow up Falls prevention discussed Falls prevention discussed - - -      Functional Status Survey: Is the patient deaf or have difficulty hearing?: No Does the patient have difficulty seeing, even when wearing glasses/contacts?: No Does the patient have difficulty concentrating, remembering, or making decisions?: No Does the patient have difficulty walking or climbing stairs?: Yes Does the patient have difficulty dressing or bathing?: No Does the patient have difficulty doing errands alone such as visiting a doctor's office or shopping?: No    Assessment & Plan  1. Hospital discharge follow-up  - CBC with Differential/Platelet - COMPLETE METABOLIC PANEL WITH GFR - Phosphorus - Magnesium  2. Anxiety disorder due to general medical condition with panic attack  - clonazePAM (KLONOPIN) 0.5 MG tablet; Take 1 tablet (0.5 mg total) by mouth 2 (two) times daily as needed for anxiety. Panic attack only  Dispense: 5 tablet; Refill: 0 - escitalopram (LEXAPRO) 5 MG tablet; Take 1 tablet (5 mg total) by mouth  daily. For anxiety, please wait until follow up with Dr. Clayborn Bigness to start this medication  Dispense: 30 tablet; Refill: 1  3. Thrombocytopenia (HCC)  - CBC with Differential/Platelet  4. Anemia, unspecified type  - CBC with Differential/Platelet  5. Acute renal failure, unspecified acute renal failure type ([place])  - COMPLETE METABOLIC PANEL WITH GFR - Phosphorus - Magnesium

## 2021-07-22 NOTE — Telephone Encounter (Signed)
Transition Care Management Follow-up Telephone Call Date of discharge and from where: 07/20/21 [facility] How have you been since you were released from the hospital? Pt states she is doing okay  Any questions or concerns? No  Items Reviewed: Did the pt receive and understand the discharge instructions provided? Yes  Medications obtained and verified? Yes  Other? No  Any new allergies since your discharge? No  Dietary orders reviewed? Yes Do you have support at home? Yes   Home Care and Equipment/Supplies: Were home health services ordered? yes If so, what is the name of the agency? [facility]  Has the agency set up a time to come to the patient's home? no Were any new equipment or medical supplies ordered?  No  Functional Questionnaire: (I = Independent and D = Dependent) ADLs: I  Bathing/Dressing- I  Meal Prep- I  Eating- I  Maintaining continence- I  Transferring/Ambulation- I  Managing Meds- I  Follow up appointments reviewed:  PCP Hospital f/u appt confirmed? Yes  Scheduled to see Dr. Ancil Boozer on 07/23/21 @ 3:20. [facility] f/u appt confirmed? Yes  Scheduled to see Dr. Clayborn Bigness on 07/31/21. Are transportation arrangements needed? No  If their condition worsens, is the pt aware to call PCP or go to the Emergency Dept.? Yes Was the patient provided with contact information for the PCP's office or ED? Yes Was to pt encouraged to call back with questions or concerns? Yes

## 2021-07-23 ENCOUNTER — Ambulatory Visit (INDEPENDENT_AMBULATORY_CARE_PROVIDER_SITE_OTHER): Payer: Medicare Other | Admitting: Family Medicine

## 2021-07-23 ENCOUNTER — Encounter: Payer: Self-pay | Admitting: Family Medicine

## 2021-07-23 ENCOUNTER — Other Ambulatory Visit: Payer: Self-pay

## 2021-07-23 VITALS — BP 132/80 | HR 68 | Temp 98.1°F | Resp 16 | Ht 71.0 in | Wt 283.0 lb

## 2021-07-23 DIAGNOSIS — F064 Anxiety disorder due to known physiological condition: Secondary | ICD-10-CM | POA: Diagnosis not present

## 2021-07-23 DIAGNOSIS — D649 Anemia, unspecified: Secondary | ICD-10-CM | POA: Diagnosis not present

## 2021-07-23 DIAGNOSIS — D696 Thrombocytopenia, unspecified: Secondary | ICD-10-CM

## 2021-07-23 DIAGNOSIS — Z09 Encounter for follow-up examination after completed treatment for conditions other than malignant neoplasm: Secondary | ICD-10-CM

## 2021-07-23 DIAGNOSIS — N179 Acute kidney failure, unspecified: Secondary | ICD-10-CM

## 2021-07-23 DIAGNOSIS — F410 Panic disorder [episodic paroxysmal anxiety] without agoraphobia: Secondary | ICD-10-CM

## 2021-07-23 MED ORDER — CLONAZEPAM 0.5 MG PO TABS: 0.5000 mg | ORAL_TABLET | Freq: Two times a day (BID) | ORAL | 0 refills | Status: DC | PRN

## 2021-07-23 MED ORDER — ESCITALOPRAM OXALATE 5 MG PO TABS: 5.0000 mg | ORAL_TABLET | Freq: Every day | ORAL | 1 refills | Status: DC

## 2021-07-23 NOTE — Patient Instructions (Signed)
Lexapro/escitalopram.  interacts with Amiodarone, please do not start this medication until you see Dr. Clayborn Bigness next week  Medication can cause QT prolongation.

## 2021-07-24 DIAGNOSIS — M25451 Effusion, right hip: Secondary | ICD-10-CM | POA: Diagnosis not present

## 2021-07-24 LAB — CBC WITH DIFFERENTIAL/PLATELET
Absolute Monocytes: 470 cells/uL (ref 200–950)
Basophils Absolute: 61 cells/uL (ref 0–200)
Basophils Relative: 0.7 %
Eosinophils Absolute: 600 cells/uL — ABNORMAL HIGH (ref 15–500)
Eosinophils Relative: 6.9 %
HCT: 38.3 % (ref 35.0–45.0)
Hemoglobin: 12.9 g/dL (ref 11.7–15.5)
Lymphs Abs: 1462 cells/uL (ref 850–3900)
MCH: 31.9 pg (ref 27.0–33.0)
MCHC: 33.7 g/dL (ref 32.0–36.0)
MCV: 94.8 fL (ref 80.0–100.0)
MPV: 10.6 fL (ref 7.5–12.5)
Monocytes Relative: 5.4 %
Neutro Abs: 6107 cells/uL (ref 1500–7800)
Neutrophils Relative %: 70.2 %
Platelets: 158 10*3/uL (ref 140–400)
RBC: 4.04 10*6/uL (ref 3.80–5.10)
RDW: 14.7 % (ref 11.0–15.0)
Total Lymphocyte: 16.8 %
WBC: 8.7 10*3/uL (ref 3.8–10.8)

## 2021-07-24 LAB — COMPLETE METABOLIC PANEL WITH GFR
AG Ratio: 1 (calc) (ref 1.0–2.5)
ALT: 27 U/L (ref 6–29)
AST: 31 U/L (ref 10–35)
Albumin: 3.8 g/dL (ref 3.6–5.1)
Alkaline phosphatase (APISO): 91 U/L (ref 37–153)
BUN/Creatinine Ratio: 17 (calc) (ref 6–22)
BUN: 17 mg/dL (ref 7–25)
CO2: 22 mmol/L (ref 20–32)
Calcium: 9.4 mg/dL (ref 8.6–10.4)
Chloride: 104 mmol/L (ref 98–110)
Creat: 1.03 mg/dL — ABNORMAL HIGH (ref 0.60–1.00)
Globulin: 3.9 g/dL (calc) — ABNORMAL HIGH (ref 1.9–3.7)
Glucose, Bld: 81 mg/dL (ref 65–99)
Potassium: 5 mmol/L (ref 3.5–5.3)
Sodium: 138 mmol/L (ref 135–146)
Total Bilirubin: 0.7 mg/dL (ref 0.2–1.2)
Total Protein: 7.7 g/dL (ref 6.1–8.1)
eGFR: 57 mL/min/{1.73_m2} — ABNORMAL LOW (ref >=60)

## 2021-07-24 LAB — MAGNESIUM: Magnesium: 2.1 mg/dL (ref 1.5–2.5)

## 2021-07-24 LAB — PHOSPHORUS: Phosphorus: 2.9 mg/dL (ref 2.1–4.3)

## 2021-07-25 ENCOUNTER — Telehealth: Payer: Self-pay | Admitting: Family Medicine

## 2021-07-25 DIAGNOSIS — M25451 Effusion, right hip: Secondary | ICD-10-CM | POA: Diagnosis not present

## 2021-07-25 NOTE — Telephone Encounter (Signed)
Home Health Verbal Orders - Caller/Agency: Jeani Hawking from [facility] Number: [phone] secured VM  Requesting OT/PT/Skilled Nursing/Social Work/Speech Therapy: Home Health PT  Frequency:  1w1 2w1 1w1 2w2 1w4

## 2021-07-26 ENCOUNTER — Telehealth: Payer: Self-pay | Admitting: Family Medicine

## 2021-07-26 DIAGNOSIS — M25451 Effusion, right hip: Secondary | ICD-10-CM | POA: Diagnosis not present

## 2021-07-26 NOTE — Telephone Encounter (Signed)
Verbal orders given  

## 2021-07-26 NOTE — Telephone Encounter (Signed)
Home Health Verbal Orders - Caller/Agency:  [facility] Number: [phone]  Requesting OT/PT/Skilled Nursing/Social Work/Speech Therapy: OT  Frequency: 1w1, 2w3

## 2021-07-29 ENCOUNTER — Other Ambulatory Visit (INDEPENDENT_AMBULATORY_CARE_PROVIDER_SITE_OTHER): Payer: Self-pay | Admitting: Nurse Practitioner

## 2021-07-29 DIAGNOSIS — L03115 Cellulitis of right lower limb: Secondary | ICD-10-CM | POA: Diagnosis not present

## 2021-07-29 DIAGNOSIS — H40153 Residual stage of open-angle glaucoma, bilateral: Secondary | ICD-10-CM | POA: Diagnosis not present

## 2021-07-30 ENCOUNTER — Telehealth: Payer: Self-pay

## 2021-07-30 DIAGNOSIS — M25451 Effusion, right hip: Secondary | ICD-10-CM | POA: Diagnosis not present

## 2021-07-30 NOTE — Telephone Encounter (Signed)
Please close

## 2021-07-30 NOTE — Telephone Encounter (Signed)
Sherlynn Stalls called asking about this order saying she has not heard back yet.

## 2021-07-30 NOTE — Telephone Encounter (Signed)
Spoke with pt and she stated that she wanted a electric bed but she did not want the one with a crank on it. She want the one like they use in the hospital         Copied from [place] [phone]. Topic: General - Call Back - No Documentation >> Jul 30, 2021  2:07 PM Scherrie Gerlach wrote: Reason for CRM: pt would like Ronny Korff to call her, she has a question she did not want to tell me

## 2021-07-31 ENCOUNTER — Telehealth: Payer: Self-pay | Admitting: Family Medicine

## 2021-07-31 DIAGNOSIS — M25451 Effusion, right hip: Secondary | ICD-10-CM | POA: Diagnosis not present

## 2021-07-31 DIAGNOSIS — E119 Type 2 diabetes mellitus without complications: Secondary | ICD-10-CM | POA: Diagnosis not present

## 2021-07-31 DIAGNOSIS — I471 Supraventricular tachycardia: Secondary | ICD-10-CM | POA: Diagnosis not present

## 2021-07-31 DIAGNOSIS — I700 Atherosclerosis of aorta: Secondary | ICD-10-CM | POA: Diagnosis not present

## 2021-07-31 DIAGNOSIS — E782 Mixed hyperlipidemia: Secondary | ICD-10-CM | POA: Diagnosis not present

## 2021-07-31 DIAGNOSIS — I480 Paroxysmal atrial fibrillation: Secondary | ICD-10-CM | POA: Diagnosis not present

## 2021-07-31 DIAGNOSIS — G4730 Sleep apnea, unspecified: Secondary | ICD-10-CM | POA: Diagnosis not present

## 2021-07-31 DIAGNOSIS — N183 Chronic kidney disease, stage 3 unspecified: Secondary | ICD-10-CM | POA: Diagnosis not present

## 2021-07-31 DIAGNOSIS — I825Z1 Chronic embolism and thrombosis of unspecified deep veins of right distal lower extremity: Secondary | ICD-10-CM | POA: Diagnosis not present

## 2021-07-31 DIAGNOSIS — I10 Essential (primary) hypertension: Secondary | ICD-10-CM | POA: Diagnosis not present

## 2021-07-31 DIAGNOSIS — I272 Pulmonary hypertension, unspecified: Secondary | ICD-10-CM | POA: Diagnosis not present

## 2021-07-31 NOTE — Telephone Encounter (Signed)
Verbal orders given  

## 2021-07-31 NOTE — Telephone Encounter (Signed)
Home Health Verbal Orders - Caller/AgencyJonelle Sidle  [facility] Number: [phone]  option 2 Request   Skilled Nursing Frequency:  2 wk 2 1 wk 7

## 2021-07-31 NOTE — Telephone Encounter (Signed)
Left voicemail to clarify with patient exactly her need. Awaiting return call to discuss.

## 2021-08-01 ENCOUNTER — Ambulatory Visit (INDEPENDENT_AMBULATORY_CARE_PROVIDER_SITE_OTHER): Payer: Medicare Other

## 2021-08-01 ENCOUNTER — Ambulatory Visit: Payer: BC Managed Care – PPO

## 2021-08-01 ENCOUNTER — Other Ambulatory Visit: Payer: Self-pay | Admitting: Family Medicine

## 2021-08-01 DIAGNOSIS — M25451 Effusion, right hip: Secondary | ICD-10-CM | POA: Diagnosis not present

## 2021-08-01 DIAGNOSIS — Z780 Asymptomatic menopausal state: Secondary | ICD-10-CM

## 2021-08-01 DIAGNOSIS — Z0000 Encounter for general adult medical examination without abnormal findings: Secondary | ICD-10-CM | POA: Diagnosis not present

## 2021-08-01 DIAGNOSIS — Z1231 Encounter for screening mammogram for malignant neoplasm of breast: Secondary | ICD-10-CM

## 2021-08-01 NOTE — Patient Instructions (Signed)
Becky Trevino , Thank you for taking time to come for your Medicare Wellness Visit. I appreciate your ongoing commitment to your health goals. Please review the following plan we discussed and let me know if I can assist you in the future.   Screening recommendations/referrals: Colonoscopy: Cologuard done 06/13/21. Repeat as directed Mammogram: done 10/29/18. Please call [phone] to schedule your mammogram and bone density screening.  Bone Density: due Recommended yearly ophthalmology/optometry visit for glaucoma screening and checkup Recommended yearly dental visit for hygiene and checkup  Vaccinations: Influenza vaccine: done 06/07/21 Pneumococcal vaccine: done 01/25/15 Tdap vaccine: done 11/06/16 Shingles vaccine: done 11/14/20 & 06/07/21   Covid-19:done 10/24/19, 11/21/19, 08/15/20, 01/14/21 & 07/29/21  Advanced directives: Please bring a copy of your health care power of attorney and living will to the office at your convenience once you have completed those documents.   Conditions/risks identified: Recommend drinking 6-8 glasses of water per day   Next appointment: Follow up in one year for your annual wellness visit    Preventive Care 65 Years and Older, Female Preventive care refers to lifestyle choices and visits with your health care provider that can promote health and wellness. What does preventive care include? A yearly physical exam. This is also called an annual well check. Dental exams once or twice a year. Routine eye exams. Ask your health care provider how often you should have your eyes checked. Personal lifestyle choices, including: Daily care of your teeth and gums. Regular physical activity. Eating a healthy diet. Avoiding tobacco and drug use. Limiting alcohol use. Practicing safe sex. Taking low-dose aspirin every day. Taking vitamin and mineral supplements as recommended by your health care provider. What happens during an annual well check? The services and  screenings done by your health care provider during your annual well check will depend on your age, overall health, lifestyle risk factors, and family history of disease. Counseling  Your health care provider may ask you questions about your: Alcohol use. Tobacco use. Drug use. Emotional well-being. Home and relationship well-being. Sexual activity. Eating habits. History of falls. Memory and ability to understand (cognition). Work and work Statistician. Reproductive health. Screening  You may have the following tests or measurements: Height, weight, and BMI. Blood pressure. Lipid and cholesterol levels. These may be checked every 5 years, or more frequently if you are over 38 years old. Skin check. Lung cancer screening. You may have this screening every year starting at age 67 if you have a 30-pack-year history of smoking and currently smoke or have quit within the past 15 years. Fecal occult blood test (FOBT) of the stool. You may have this test every year starting at age 64. Flexible sigmoidoscopy or colonoscopy. You may have a sigmoidoscopy every 5 years or a colonoscopy every 10 years starting at age 80. Hepatitis C blood test. Hepatitis B blood test. Sexually transmitted disease (STD) testing. Diabetes screening. This is done by checking your blood sugar (glucose) after you have not eaten for a while (fasting). You may have this done every 1-3 years. Bone density scan. This is done to screen for osteoporosis. You may have this done starting at age 62. Mammogram. This may be done every 1-2 years. Talk to your health care provider about how often you should have regular mammograms. Talk with your health care provider about your test results, treatment options, and if necessary, the need for more tests. Vaccines  Your health care provider may recommend certain vaccines, such as: Influenza vaccine. This is  recommended every year. Tetanus, diphtheria, and acellular pertussis (Tdap,  Td) vaccine. You may need a Td booster every 10 years. Zoster vaccine. You may need this after age 31. Pneumococcal 13-valent conjugate (PCV13) vaccine. One dose is recommended after age 31. Pneumococcal polysaccharide (PPSV23) vaccine. One dose is recommended after age 89. Talk to your health care provider about which screenings and vaccines you need and how often you need them. This information is not intended to replace advice given to you by your health care provider. Make sure you discuss any questions you have with your health care provider. Document Released: 09/28/2015 Document Revised: 05/21/2016 Document Reviewed: 07/03/2015 Elsevier Interactive Patient Education  2017 [place] Prevention in the Home Falls can cause injuries. They can happen to people of all ages. There are many things you can do to make your home safe and to help prevent falls. What can I do on the outside of my home? Regularly fix the edges of walkways and driveways and fix any cracks. Remove anything that might make you trip as you walk through a door, such as a raised step or threshold. Trim any bushes or trees on the path to your home. Use bright outdoor lighting. Clear any walking paths of anything that might make someone trip, such as rocks or tools. Regularly check to see if handrails are loose or broken. Make sure that both sides of any steps have handrails. Any raised decks and porches should have guardrails on the edges. Have any leaves, snow, or ice cleared regularly. Use sand or salt on walking paths during winter. Clean up any spills in your garage right away. This includes oil or grease spills. What can I do in the bathroom? Use night lights. Install grab bars by the toilet and in the tub and shower. Do not use towel bars as grab bars. Use non-skid mats or decals in the tub or shower. If you need to sit down in the shower, use a plastic, non-slip stool. Keep the floor dry. Clean up any  water that spills on the floor as soon as it happens. Remove soap buildup in the tub or shower regularly. Attach bath mats securely with double-sided non-slip rug tape. Do not have throw rugs and other things on the floor that can make you trip. What can I do in the bedroom? Use night lights. Make sure that you have a light by your bed that is easy to reach. Do not use any sheets or blankets that are too big for your bed. They should not hang down onto the floor. Have a firm chair that has side arms. You can use this for support while you get dressed. Do not have throw rugs and other things on the floor that can make you trip. What can I do in the kitchen? Clean up any spills right away. Avoid walking on wet floors. Keep items that you use a lot in easy-to-reach places. If you need to reach something above you, use a strong step stool that has a grab bar. Keep electrical cords out of the way. Do not use floor polish or wax that makes floors slippery. If you must use wax, use non-skid floor wax. Do not have throw rugs and other things on the floor that can make you trip. What can I do with my stairs? Do not leave any items on the stairs. Make sure that there are handrails on both sides of the stairs and use them. Fix handrails that are  broken or loose. Make sure that handrails are as long as the stairways. Check any carpeting to make sure that it is firmly attached to the stairs. Fix any carpet that is loose or worn. Avoid having throw rugs at the top or bottom of the stairs. If you do have throw rugs, attach them to the floor with carpet tape. Make sure that you have a light switch at the top of the stairs and the bottom of the stairs. If you do not have them, ask someone to add them for you. What else can I do to help prevent falls? Wear shoes that: Do not have high heels. Have rubber bottoms. Are comfortable and fit you well. Are closed at the toe. Do not wear sandals. If you use a  stepladder: Make sure that it is fully opened. Do not climb a closed stepladder. Make sure that both sides of the stepladder are locked into place. Ask someone to hold it for you, if possible. Clearly mark and make sure that you can see: Any grab bars or handrails. First and last steps. Where the edge of each step is. Use tools that help you move around (mobility aids) if they are needed. These include: Canes. Walkers. Scooters. Crutches. Turn on the lights when you go into a dark area. Replace any light bulbs as soon as they burn out. Set up your furniture so you have a clear path. Avoid moving your furniture around. If any of your floors are uneven, fix them. If there are any pets around you, be aware of where they are. Review your medicines with your doctor. Some medicines can make you feel dizzy. This can increase your chance of falling. Ask your doctor what other things that you can do to help prevent falls. This information is not intended to replace advice given to you by your health care provider. Make sure you discuss any questions you have with your health care provider. Document Released: 06/28/2009 Document Revised: 02/07/2016 Document Reviewed: 10/06/2014 Elsevier Interactive Patient Education  2017 [employer].

## 2021-08-01 NOTE — Progress Notes (Signed)
Subjective:   Becky Trevino is a 75 y.o. female who presents for Medicare Annual (Subsequent) preventive examination.  Virtual Visit via Telephone Note  I connected with  Becky Trevino on 08/01/21 at 10:00 AM EST by telephone and verified that I am speaking with the correct person using two identifiers.  Location: Patient: home Provider: [place] Persons participating in the virtual visit: [place]   I discussed the limitations, risks, security and privacy concerns of performing an evaluation and management service by telephone and the availability of in person appointments. The patient expressed understanding and agreed to proceed.  Interactive audio and video telecommunications were attempted between this nurse and patient, however failed, due to patient having technical difficulties OR patient did not have access to video capability.  We continued and completed visit with audio only.  Some vital signs may be absent or patient reported.   Clemetine Marker, LPN   Review of Systems     Cardiac Risk Factors include: advanced age (>16men, >30 women);diabetes mellitus;dyslipidemia;hypertension;sedentary lifestyle;obesity (BMI >30kg/m2)     Objective:    There were no vitals filed for this visit. There is no height or weight on file to calculate BMI.  Advanced Directives 08/01/2021 05/24/2021 02/23/2021 09/03/2020 07/31/2020 07/06/2020 04/04/2020  Does Patient Have a Medical Advance Directive? No No No Yes Yes No Yes  Type of Advance Directive - - - Living will [place];Living will - -  Does patient want to make changes to medical advance directive? - - - No - Patient declined - No - Patient declined -  Copy of [place] in Chart? - - - - No - copy requested - -  Would patient like information on creating a medical advance directive? No - Patient declined - No - Patient declined - - - -    Current Medications  (verified) Outpatient Encounter Medications as of 08/01/2021  Medication Sig   acetaminophen (TYLENOL) 650 MG CR tablet Take 650-1,300 mg by mouth 2 (two) times daily as needed for pain.   amiodarone (PACERONE) 200 MG tablet Take 200 mg by mouth 2 (two) times daily.   ASPIRIN LOW DOSE 81 MG EC tablet TAKE 1 TABLET BY MOUTH EVERY DAY   atorvastatin (LIPITOR) 40 MG tablet Take 1 tablet (40 mg total) by mouth every evening.   Calcium Carbonate-Vitamin D 600-400 MG-UNIT tablet Take 1 tablet by mouth 2 (two) times daily.   clonazePAM (KLONOPIN) 0.5 MG tablet Take 1 tablet (0.5 mg total) by mouth 2 (two) times daily as needed for anxiety. Panic attack only   doxycycline (VIBRAMYCIN) 100 MG capsule Take 100 mg by mouth 2 (two) times daily.   escitalopram (LEXAPRO) 5 MG tablet Take 1 tablet (5 mg total) by mouth daily. For anxiety, please wait until follow up with Dr. Clayborn Bigness to start this medication   fluticasone (FLONASE) 50 MCG/ACT nasal spray SPRAY 2 SPRAYS INTO EACH NOSTRIL EVERY DAY   ketoconazole (NIZORAL) 2 % cream Apply 1 application topically 2 (two) times daily as needed (rash).    latanoprost (XALATAN) 0.005 % ophthalmic solution SMARTSIG:In Eye(s)   Magnesium Oxide 500 MG CAPS Take 500 mg by mouth daily.    metoprolol succinate (TOPROL-XL) 25 MG 24 hr tablet TAKE 1 TABLET (25 MG TOTAL) BY MOUTH DAILY.   mirabegron ER (MYRBETRIQ) 50 MG TB24 tablet Take 1 tablet (50 mg total) by mouth daily.   oxyCODONE (OXY IR/ROXICODONE) 5 MG immediate release tablet Take 1  tablet by mouth 4 (four) times daily as needed.   polyethylene glycol (MIRALAX / GLYCOLAX) 17 g packet Take 17 g by mouth daily.   potassium chloride (KLOR-CON) 10 MEQ tablet Take 10 mEq by mouth daily.   SIMBRINZA 1-0.2 % SUSP Place 1 drop into both eyes 2 (two) times daily.   sodium chloride (OCEAN) 0.65 % SOLN nasal spray Place 1 spray into both nostrils 3 (three) times daily.   traZODone (DESYREL) 50 MG tablet Take 0.5-1 tablets  (25-50 mg total) by mouth at bedtime as needed for sleep.   apixaban (ELIQUIS) 5 MG TABS tablet Take 1 tablet by mouth 2 (two) times daily.   levothyroxine (SYNTHROID) 25 MCG tablet TAKE 1 TABLET (25 MCG TOTAL) BY MOUTH DAILY AT 6 (SIX) AM.   torsemide (DEMADEX) 20 MG tablet Take 1 tablet (20 mg total) by mouth daily.   No facility-administered encounter medications on file as of 08/01/2021.    Allergies (verified) Lisinopril   History: Past Medical History:  Diagnosis Date   Cervical spondylosis    Chronic kidney disease    Stage 3   DDD (degenerative disc disease), cervical    [facility]   Diabetes mellitus without complication (HCC)    Diverticulosis    Hyperlipidemia    Hypertension    Intertrigo    Leg cramps    Leg varices    Obesity    OSA (obstructive sleep apnea)    Symptomatic menopausal or female climacteric states    Syncope    Past Surgical History:  Procedure Laterality Date   ABDOMINAL HYSTERECTOMY  1993   Total   BUNIONECTOMY Bilateral 1993   I & D KNEE WITH POLY EXCHANGE Right 11/19/2017   Procedure: RIGHT KNEE POLY EXCHANGE WITH IRRIGATION AND DEBRIDEMENT;  Surgeon: Hessie Knows, MD;  Location: [facility];  Service: Orthopedics;  Laterality: Right;   JOINT REPLACEMENT     bilateral knee   LAMINECTOMY  11/14/2013    Cervical Fusion , [facility], Dr. Delilah Shan   LASER ABLATION Bilateral 07/29/2012   Dr. Lucky Cowboy   LOWER EXTREMITY ANGIOGRAPHY Right 11/02/2019   Procedure: Lower Extremity Angiography;  Surgeon: Katha Cabal, MD;  Location: [facility];  Service: Cardiovascular;  Laterality: Right;   ORIF ANKLE FRACTURE Right 09/02/2019   Procedure: OPEN REDUCTION INTERNAL FIXATION (ORIF) ANKLE FRACTURE BIMALLEOLAR;  Surgeon: [name], DPM;  Location: [facility];  Service: Podiatry;  Laterality: Right;   PERIPHERAL VASCULAR THROMBECTOMY Right 03/02/2020   Procedure: PERIPHERAL VASCULAR THROMBECTOMY;  Surgeon: Algernon Huxley, MD;  Location: [facility];  Service: Cardiovascular;  Laterality: Right;   REPLACEMENT TOTAL KNEE Left 03/2010   Dr. Rudene Christians   TOTAL HIP ARTHROPLASTY Left 12/11/2015   Procedure: TOTAL HIP ARTHROPLASTY ANTERIOR APPROACH;  Surgeon: Hessie Knows, MD;  Location: [facility];  Service: Orthopedics;  Laterality: Left;   TOTAL KNEE ARTHROPLASTY Right 03/11/2016   Procedure: TOTAL KNEE ARTHROPLASTY;  Surgeon: Hessie Knows, MD;  Location: [facility];  Service: Orthopedics;  Laterality: Right;   Family History  Problem Relation Age of Onset   Diabetes Mother    Hyperlipidemia Mother    Hypertension Mother    Diabetes Father    Hyperlipidemia Father    Hypertension Father    Obesity Father    Hypertension Sister    Hyperlipidemia Sister    Hyperlipidemia Sister    Breast cancer Neg Hx    Social History   Socioeconomic History   Marital status: Married  Spouse name: Not on file   Number of children: 1   Years of education: Not on file   Highest education level: Not on file  Occupational History   Not on file  Tobacco Use   Smoking status: Never   Smokeless tobacco: Never  Vaping Use   Vaping Use: Never used  Substance and Sexual Activity   Alcohol use: No    Alcohol/week: 0.0 standard drinks   Drug use: No   Sexual activity: Yes    Partners: Male  Other Topics Concern   Not on file  Social History Narrative   Married, one adopted grown child    Social Determinants of Radio broadcast assistant Strain: Low Risk    Difficulty of Paying Living Expenses: Not hard at all  Food Insecurity: No Food Insecurity   Worried About Charity fundraiser in the Last Year: Never true   Arboriculturist in the Last Year: Never true  Transportation Needs: No Transportation Needs   Lack of Transportation (Medical): No   Lack of Transportation (Non-Medical): No  Physical Activity: Insufficiently Active   Days of Exercise per Week: 2 days   Minutes of Exercise per Session: 30 min  Stress: No Stress Concern Present    Feeling of Stress : Only a little  Social Connections: Engineer, building services of Communication with Friends and Family: More than three times a week   Frequency of Social Gatherings with Friends and Family: More than three times a week   Attends Religious Services: More than 4 times per year   Active Member of [employer] or Organizations: Yes   Attends Music therapist: More than 4 times per year   Marital Status: Married    Tobacco Counseling Counseling given: Not Answered   Clinical Intake:  Pre-visit preparation completed: Yes  Pain : No/denies pain   Nutrition Risk Assessment:  Has the patient had any N/V/D within the last 2 months?  No  Does the patient have any non-healing wounds?  No  Has the patient had any unintentional weight loss or weight gain?  No   Diabetes:  Is the patient diabetic?  Yes  If diabetic, was a CBG obtained today?  No  Did the patient bring in their glucometer from home?  No  How often do you monitor your CBG's? rarely   Financial Strains and Diabetes Management:  Are you having any financial strains with the device, your supplies or your medication? No .  Does the patient want to be seen by Chronic Care Management for management of their diabetes?  No  Would the patient like to be referred to a Nutritionist or for Diabetic Management?  No   Diabetic Exams:  Diabetic Eye Exam: Completed 03/22/21 negative retinopathy.   Diabetic Foot Exam: Completed 08/28/20.   Nutritional Risks: None Diabetes: Yes CBG done?: No Did pt. bring in CBG monitor from home?: No  How often do you need to have someone help you when you read instructions, pamphlets, or other written materials from your doctor or pharmacy?: 1 - Never    Interpreter Needed?: No  Information entered by :: Clemetine Marker LPN   Activities of Daily Living In your present state of health, do you have any difficulty performing the following activities: 08/01/2021  07/23/2021  Hearing? N N  Vision? N N  Difficulty concentrating or making decisions? N N  Walking or climbing stairs? Y Y  Dressing or bathing? N N  Doing errands, shopping? N N  Preparing Food and eating ? N -  Using the Toilet? N -  In the past six months, have you accidently leaked urine? N -  Do you have problems with loss of bowel control? N -  Managing your Medications? N -  Managing your Finances? N -  Housekeeping or managing your Housekeeping? N -  Some recent data might be hidden    Patient Care Team: [name], MD as PCP - General (Family Medicine) Yolonda Kida, MD as Consulting Physician (Cardiology) Lorelee Cover., MD as Consulting Physician (Ophthalmology) Hessie Knows, MD as Consulting Physician (Orthopedic Surgery) Vern Claude, LCSW as Social Worker [name], DPM as Consulting Physician (Podiatry) [name], MD as Consulting Physician (Oncology) Lucky Cowboy Erskine Squibb, MD as Referring Physician (Vascular Surgery)  Indicate any recent Medical Services you may have received from other than [facility] providers in the past year (date may be approximate).     Assessment:   This is a routine wellness examination for Becky Trevino.  Hearing/Vision screen Hearing Screening - Comments:: Pt denies hearing difficulty  Vision Screening - Comments::  Annual vision screenings done at [place] issues and exercise activities discussed: Current Exercise Habits: Home exercise routine, Type of exercise: calisthenics (physical therapy), Time (Minutes): 30, Frequency (Times/Week): 2, Weekly Exercise (Minutes/Week): 60, Intensity: Mild, Exercise limited by: orthopedic condition(s)   Goals Addressed             This Visit's Progress    DIET - INCREASE WATER INTAKE       Recommend drinking 6-8 glasses of water per day        Depression Screen PHQ 2/9 Scores 08/01/2021 07/23/2021 06/21/2021 06/07/2021 02/04/2021 01/01/2021 11/14/2020  PHQ - 2 Score 0 0 0 0 0 0 0   PHQ- 9 Score - - - - - - -    Fall Risk Fall Risk  08/01/2021 07/23/2021 06/07/2021 02/04/2021 01/01/2021  Falls in the past year? 0 - 0 0 0  Number falls in past yr: 0 - 0 0 0  Comment - - - - -  Injury with Fall? 0 - 0 0 0  Comment - - - - -  Risk for fall due to : Impaired balance/gait;Impaired mobility Impaired mobility;Impaired balance/gait Impaired mobility - -  Follow up Falls prevention discussed Falls prevention discussed Falls prevention discussed - -    FALL RISK PREVENTION PERTAINING TO THE HOME:  Any stairs in or around the home? No  If so, are there any without handrails? No  Home free of loose throw rugs in walkways, pet beds, electrical cords, etc? Yes  Adequate lighting in your home to reduce risk of falls? Yes   ASSISTIVE DEVICES UTILIZED TO PREVENT FALLS:  Life alert? No  Use of a cane, walker or w/c? Yes  Grab bars in the bathroom? No  Shower chair or bench in shower? Yes  Elevated toilet seat or a handicapped toilet? Yes   TIMED UP AND GO:  Was the test performed? No  telephonic visit  Cognitive Function: Normal cognitive status assessed by direct observation by this Nurse Health Advisor. No abnormalities found.       6CIT Screen 07/31/2020  What Year? 0 points  What month? 0 points  What time? 0 points  Count back from 20 0 points  Months in reverse 0 points  Repeat phrase 0 points  Total Score 0    Immunizations Immunization History  Administered Date(s)  Administered   Fluad Quad(high Dose 65+) 05/12/2019, 05/29/2020, 06/07/2021   Influenza, High Dose Seasonal PF 05/22/2016, 06/09/2017, 05/21/2018   Influenza,inj,Quad PF,6+ Mos 04/26/2015   Influenza-Unspecified 04/15/2014   Moderna Covid-19 Vaccine Bivalent Booster 36yrs & up 07/29/2021   Moderna Sars-Covid-2 Vaccination 10/24/2019, 11/21/2019, 08/15/2020, 01/14/2021   Pneumococcal Conjugate-13 07/09/2006, 01/25/2015   Pneumococcal Polysaccharide-23 03/11/2012   Tdap 06/03/2006,  11/06/2016   Zoster Recombinat (Shingrix) 11/14/2020, 06/07/2021   Zoster, Live 06/19/2010    TDAP status: Up to date  Flu Vaccine status: Up to date  Pneumococcal vaccine status: Up to date  Covid-19 vaccine status: Completed vaccines  Qualifies for Shingles Vaccine? Yes   Zostavax completed Yes   Shingrix Completed?: Yes  Screening Tests Health Maintenance  Topic Date Due   FOOT EXAM  08/28/2021   HEMOGLOBIN A1C  12/05/2021   URINE MICROALBUMIN  02/13/2022   OPHTHALMOLOGY EXAM  03/22/2022   Fecal DNA (Cologuard)  06/13/2024   TETANUS/TDAP  11/06/2026   Pneumonia Vaccine 55+ Years old  Completed   INFLUENZA VACCINE  Completed   DEXA SCAN  Completed   COVID-19 Vaccine  Completed   Hepatitis C Screening  Completed   Zoster Vaccines- Shingrix  Completed   HPV VACCINES  Aged Out   COLONOSCOPY (Pts 45-66yrs Insurance coverage will need to be confirmed)  Discontinued    Health Maintenance  There are no preventive care reminders to display for this patient.   Colorectal cancer screening: Type of screening: Cologuard. Completed 06/13/21. Repeat every 3 years  Mammogram status: Completed 10/29/18. Repeat every year. Ordered today  Bone Density status: Ordered today. Pt provided with contact info and advised to call to schedule appt.  Lung Cancer Screening: (Low Dose CT Chest recommended if Age 34-80 years, 30 pack-year currently smoking OR have quit w/in 15years.) does not qualify.   Additional Screening:  Hepatitis C Screening: does qualify; Completed 07/22/12  Vision Screening: Recommended annual ophthalmology exams for early detection of glaucoma and other disorders of the eye. Is the patient up to date with their annual eye exam?  Yes  Who is the provider or what is the name of the office in which the patient attends annual eye exams? Dr. Gloriann Loan.   Dental Screening: Recommended annual dental exams for proper oral hygiene  Community Resource Referral / Chronic Care  Management: CRR required this visit?  No   CCM required this visit?  No      Plan:     I have personally reviewed and noted the following in the patient's chart:   Medical and social history Use of alcohol, tobacco or illicit drugs  Current medications and supplements including opioid prescriptions.  Functional ability and status Nutritional status Physical activity Advanced directives List of other physicians Hospitalizations, surgeries, and ER visits in previous 12 months Vitals Screenings to include cognitive, depression, and falls Referrals and appointments  In addition, I have reviewed and discussed with patient certain preventive protocols, quality metrics, and best practice recommendations. A written personalized care plan for preventive services as well as general preventive health recommendations were provided to patient.     Clemetine Marker, LPN   [id number]   Nurse Notes: none

## 2021-08-02 DIAGNOSIS — M25451 Effusion, right hip: Secondary | ICD-10-CM | POA: Diagnosis not present

## 2021-08-05 DIAGNOSIS — G4733 Obstructive sleep apnea (adult) (pediatric): Secondary | ICD-10-CM | POA: Diagnosis not present

## 2021-08-05 DIAGNOSIS — I471 Supraventricular tachycardia: Secondary | ICD-10-CM | POA: Diagnosis not present

## 2021-08-05 DIAGNOSIS — I890 Lymphedema, not elsewhere classified: Secondary | ICD-10-CM | POA: Diagnosis not present

## 2021-08-05 DIAGNOSIS — D631 Anemia in chronic kidney disease: Secondary | ICD-10-CM | POA: Diagnosis not present

## 2021-08-05 DIAGNOSIS — E1169 Type 2 diabetes mellitus with other specified complication: Secondary | ICD-10-CM | POA: Diagnosis not present

## 2021-08-05 DIAGNOSIS — M25451 Effusion, right hip: Secondary | ICD-10-CM | POA: Diagnosis not present

## 2021-08-05 DIAGNOSIS — M5030 Other cervical disc degeneration, unspecified cervical region: Secondary | ICD-10-CM | POA: Diagnosis not present

## 2021-08-05 DIAGNOSIS — G4700 Insomnia, unspecified: Secondary | ICD-10-CM | POA: Diagnosis not present

## 2021-08-05 DIAGNOSIS — E1122 Type 2 diabetes mellitus with diabetic chronic kidney disease: Secondary | ICD-10-CM | POA: Diagnosis not present

## 2021-08-05 DIAGNOSIS — M1990 Unspecified osteoarthritis, unspecified site: Secondary | ICD-10-CM | POA: Diagnosis not present

## 2021-08-05 DIAGNOSIS — I5032 Chronic diastolic (congestive) heart failure: Secondary | ICD-10-CM | POA: Diagnosis not present

## 2021-08-05 DIAGNOSIS — K5792 Diverticulitis of intestine, part unspecified, without perforation or abscess without bleeding: Secondary | ICD-10-CM | POA: Diagnosis not present

## 2021-08-05 DIAGNOSIS — M47812 Spondylosis without myelopathy or radiculopathy, cervical region: Secondary | ICD-10-CM | POA: Diagnosis not present

## 2021-08-05 DIAGNOSIS — I700 Atherosclerosis of aorta: Secondary | ICD-10-CM | POA: Diagnosis not present

## 2021-08-05 DIAGNOSIS — I2510 Atherosclerotic heart disease of native coronary artery without angina pectoris: Secondary | ICD-10-CM | POA: Diagnosis not present

## 2021-08-05 DIAGNOSIS — N1832 Chronic kidney disease, stage 3b: Secondary | ICD-10-CM | POA: Diagnosis not present

## 2021-08-05 DIAGNOSIS — D696 Thrombocytopenia, unspecified: Secondary | ICD-10-CM | POA: Diagnosis not present

## 2021-08-05 DIAGNOSIS — I110 Hypertensive heart disease with heart failure: Secondary | ICD-10-CM | POA: Diagnosis not present

## 2021-08-05 DIAGNOSIS — I87303 Chronic venous hypertension (idiopathic) without complications of bilateral lower extremity: Secondary | ICD-10-CM | POA: Diagnosis not present

## 2021-08-05 DIAGNOSIS — E039 Hypothyroidism, unspecified: Secondary | ICD-10-CM | POA: Diagnosis not present

## 2021-08-05 DIAGNOSIS — E785 Hyperlipidemia, unspecified: Secondary | ICD-10-CM | POA: Diagnosis not present

## 2021-08-05 DIAGNOSIS — T8453XD Infection and inflammatory reaction due to internal right knee prosthesis, subsequent encounter: Secondary | ICD-10-CM | POA: Diagnosis not present

## 2021-08-05 DIAGNOSIS — E441 Mild protein-calorie malnutrition: Secondary | ICD-10-CM | POA: Diagnosis not present

## 2021-08-07 DIAGNOSIS — E441 Mild protein-calorie malnutrition: Secondary | ICD-10-CM | POA: Diagnosis not present

## 2021-08-07 DIAGNOSIS — D696 Thrombocytopenia, unspecified: Secondary | ICD-10-CM | POA: Diagnosis not present

## 2021-08-07 DIAGNOSIS — K5792 Diverticulitis of intestine, part unspecified, without perforation or abscess without bleeding: Secondary | ICD-10-CM | POA: Diagnosis not present

## 2021-08-07 DIAGNOSIS — M25451 Effusion, right hip: Secondary | ICD-10-CM | POA: Diagnosis not present

## 2021-08-07 DIAGNOSIS — M5030 Other cervical disc degeneration, unspecified cervical region: Secondary | ICD-10-CM | POA: Diagnosis not present

## 2021-08-07 DIAGNOSIS — I2510 Atherosclerotic heart disease of native coronary artery without angina pectoris: Secondary | ICD-10-CM | POA: Diagnosis not present

## 2021-08-07 DIAGNOSIS — I890 Lymphedema, not elsewhere classified: Secondary | ICD-10-CM | POA: Diagnosis not present

## 2021-08-07 DIAGNOSIS — I110 Hypertensive heart disease with heart failure: Secondary | ICD-10-CM | POA: Diagnosis not present

## 2021-08-07 DIAGNOSIS — E785 Hyperlipidemia, unspecified: Secondary | ICD-10-CM | POA: Diagnosis not present

## 2021-08-07 DIAGNOSIS — E1122 Type 2 diabetes mellitus with diabetic chronic kidney disease: Secondary | ICD-10-CM | POA: Diagnosis not present

## 2021-08-07 DIAGNOSIS — M1990 Unspecified osteoarthritis, unspecified site: Secondary | ICD-10-CM | POA: Diagnosis not present

## 2021-08-07 DIAGNOSIS — E039 Hypothyroidism, unspecified: Secondary | ICD-10-CM | POA: Diagnosis not present

## 2021-08-07 DIAGNOSIS — T8453XD Infection and inflammatory reaction due to internal right knee prosthesis, subsequent encounter: Secondary | ICD-10-CM | POA: Diagnosis not present

## 2021-08-07 DIAGNOSIS — M47812 Spondylosis without myelopathy or radiculopathy, cervical region: Secondary | ICD-10-CM | POA: Diagnosis not present

## 2021-08-07 DIAGNOSIS — E1169 Type 2 diabetes mellitus with other specified complication: Secondary | ICD-10-CM | POA: Diagnosis not present

## 2021-08-07 DIAGNOSIS — I471 Supraventricular tachycardia: Secondary | ICD-10-CM | POA: Diagnosis not present

## 2021-08-07 DIAGNOSIS — N1832 Chronic kidney disease, stage 3b: Secondary | ICD-10-CM | POA: Diagnosis not present

## 2021-08-07 DIAGNOSIS — I87303 Chronic venous hypertension (idiopathic) without complications of bilateral lower extremity: Secondary | ICD-10-CM | POA: Diagnosis not present

## 2021-08-07 DIAGNOSIS — D631 Anemia in chronic kidney disease: Secondary | ICD-10-CM | POA: Diagnosis not present

## 2021-08-07 DIAGNOSIS — G4733 Obstructive sleep apnea (adult) (pediatric): Secondary | ICD-10-CM | POA: Diagnosis not present

## 2021-08-07 DIAGNOSIS — G4700 Insomnia, unspecified: Secondary | ICD-10-CM | POA: Diagnosis not present

## 2021-08-07 DIAGNOSIS — I5032 Chronic diastolic (congestive) heart failure: Secondary | ICD-10-CM | POA: Diagnosis not present

## 2021-08-07 DIAGNOSIS — I700 Atherosclerosis of aorta: Secondary | ICD-10-CM | POA: Diagnosis not present

## 2021-08-09 ENCOUNTER — Other Ambulatory Visit: Payer: Self-pay | Admitting: Family Medicine

## 2021-08-09 DIAGNOSIS — R600 Localized edema: Secondary | ICD-10-CM

## 2021-08-09 NOTE — Telephone Encounter (Signed)
Requested medication (s) are due for refill today: -  Requested medication (s) are on the active medication list: expired  Last refill:  02/04/21  Future visit scheduled: yes  Notes to clinic:  med expired 03/06/21   Requested Prescriptions  Pending Prescriptions Disp Refills   torsemide (DEMADEX) 20 MG tablet [Pharmacy Med Name: TORSEMIDE 20 MG TABLET] 90 tablet 1    Sig: TAKE 1 TABLET BY MOUTH EVERY DAY     Cardiovascular:  Diuretics - Loop Failed - 08/09/2021  6:04 AM      Failed - Cr in normal range and within 360 days    Creat  Date Value Ref Range Status  07/23/2021 1.03 (H) 0.60 - 1.00 mg/dL Final   Creatinine, Urine  Date Value Ref Range Status  02/13/2021 130 20 - 275 mg/dL Final          Passed - K in normal range and within 360 days    Potassium  Date Value Ref Range Status  07/23/2021 5.0 3.5 - 5.3 mmol/L Final  10/18/2012 3.5 3.5 - 5.1 mmol/L Final          Passed - Ca in normal range and within 360 days    Calcium  Date Value Ref Range Status  07/23/2021 9.4 8.6 - 10.4 mg/dL Final   Calcium, Total  Date Value Ref Range Status  10/18/2012 7.9 (L) 8.5 - 10.1 mg/dL Final   Calcium, Ion  Date Value Ref Range Status  09/02/2019 1.17 1.15 - 1.40 mmol/L Final          Passed - Na in normal range and within 360 days    Sodium  Date Value Ref Range Status  07/23/2021 138 135 - 146 mmol/L Final  11/26/2015 142 134 - 144 mmol/L Final  10/18/2012 141 136 - 145 mmol/L Final          Passed - Last BP in normal range    BP Readings from Last 1 Encounters:  07/23/21 132/80          Passed - Valid encounter within last 6 months    Recent Outpatient Visits           2 weeks ago Hospital discharge follow-up   [facility] [name], MD   2 months ago Type 2 diabetes mellitus with diabetic neuropathy, without long-term current use of insulin [facility])   [facility] [place], Drue Stager, MD   6 months ago Type 2  diabetes mellitus with diabetic neuropathy, without long-term current use of insulin [facility])   [facility] [name], MD   7 months ago Gait difficulty   [facility] [place], Drue Stager, MD   8 months ago Type 2 diabetes mellitus with diabetic neuropathy, without long-term current use of insulin [facility])   [facility] [name], MD       Future Appointments             In 1 month [name], MD [facility], [place]   In 10 months [name] [place]   In 12 months  [facility], [facility]

## 2021-08-12 DIAGNOSIS — T8453XD Infection and inflammatory reaction due to internal right knee prosthesis, subsequent encounter: Secondary | ICD-10-CM | POA: Diagnosis not present

## 2021-08-12 DIAGNOSIS — G4700 Insomnia, unspecified: Secondary | ICD-10-CM | POA: Diagnosis not present

## 2021-08-12 DIAGNOSIS — M5030 Other cervical disc degeneration, unspecified cervical region: Secondary | ICD-10-CM | POA: Diagnosis not present

## 2021-08-12 DIAGNOSIS — D631 Anemia in chronic kidney disease: Secondary | ICD-10-CM | POA: Diagnosis not present

## 2021-08-12 DIAGNOSIS — I700 Atherosclerosis of aorta: Secondary | ICD-10-CM | POA: Diagnosis not present

## 2021-08-12 DIAGNOSIS — I110 Hypertensive heart disease with heart failure: Secondary | ICD-10-CM | POA: Diagnosis not present

## 2021-08-12 DIAGNOSIS — N1832 Chronic kidney disease, stage 3b: Secondary | ICD-10-CM | POA: Diagnosis not present

## 2021-08-12 DIAGNOSIS — I2510 Atherosclerotic heart disease of native coronary artery without angina pectoris: Secondary | ICD-10-CM | POA: Diagnosis not present

## 2021-08-12 DIAGNOSIS — M25451 Effusion, right hip: Secondary | ICD-10-CM | POA: Diagnosis not present

## 2021-08-12 DIAGNOSIS — E1122 Type 2 diabetes mellitus with diabetic chronic kidney disease: Secondary | ICD-10-CM | POA: Diagnosis not present

## 2021-08-12 DIAGNOSIS — M47812 Spondylosis without myelopathy or radiculopathy, cervical region: Secondary | ICD-10-CM | POA: Diagnosis not present

## 2021-08-12 DIAGNOSIS — I890 Lymphedema, not elsewhere classified: Secondary | ICD-10-CM | POA: Diagnosis not present

## 2021-08-12 DIAGNOSIS — D696 Thrombocytopenia, unspecified: Secondary | ICD-10-CM | POA: Diagnosis not present

## 2021-08-12 DIAGNOSIS — E039 Hypothyroidism, unspecified: Secondary | ICD-10-CM | POA: Diagnosis not present

## 2021-08-12 DIAGNOSIS — I5032 Chronic diastolic (congestive) heart failure: Secondary | ICD-10-CM | POA: Diagnosis not present

## 2021-08-12 DIAGNOSIS — E441 Mild protein-calorie malnutrition: Secondary | ICD-10-CM | POA: Diagnosis not present

## 2021-08-12 DIAGNOSIS — I471 Supraventricular tachycardia: Secondary | ICD-10-CM | POA: Diagnosis not present

## 2021-08-12 DIAGNOSIS — M1990 Unspecified osteoarthritis, unspecified site: Secondary | ICD-10-CM | POA: Diagnosis not present

## 2021-08-12 DIAGNOSIS — E1169 Type 2 diabetes mellitus with other specified complication: Secondary | ICD-10-CM | POA: Diagnosis not present

## 2021-08-12 DIAGNOSIS — E785 Hyperlipidemia, unspecified: Secondary | ICD-10-CM | POA: Diagnosis not present

## 2021-08-12 DIAGNOSIS — G4733 Obstructive sleep apnea (adult) (pediatric): Secondary | ICD-10-CM | POA: Diagnosis not present

## 2021-08-12 DIAGNOSIS — I87303 Chronic venous hypertension (idiopathic) without complications of bilateral lower extremity: Secondary | ICD-10-CM | POA: Diagnosis not present

## 2021-08-12 DIAGNOSIS — K5792 Diverticulitis of intestine, part unspecified, without perforation or abscess without bleeding: Secondary | ICD-10-CM | POA: Diagnosis not present

## 2021-08-13 DIAGNOSIS — D696 Thrombocytopenia, unspecified: Secondary | ICD-10-CM | POA: Diagnosis not present

## 2021-08-13 DIAGNOSIS — I700 Atherosclerosis of aorta: Secondary | ICD-10-CM | POA: Diagnosis not present

## 2021-08-13 DIAGNOSIS — T8453XD Infection and inflammatory reaction due to internal right knee prosthesis, subsequent encounter: Secondary | ICD-10-CM | POA: Diagnosis not present

## 2021-08-13 DIAGNOSIS — M5030 Other cervical disc degeneration, unspecified cervical region: Secondary | ICD-10-CM | POA: Diagnosis not present

## 2021-08-13 DIAGNOSIS — M47812 Spondylosis without myelopathy or radiculopathy, cervical region: Secondary | ICD-10-CM | POA: Diagnosis not present

## 2021-08-13 DIAGNOSIS — K5792 Diverticulitis of intestine, part unspecified, without perforation or abscess without bleeding: Secondary | ICD-10-CM | POA: Diagnosis not present

## 2021-08-13 DIAGNOSIS — E785 Hyperlipidemia, unspecified: Secondary | ICD-10-CM | POA: Diagnosis not present

## 2021-08-13 DIAGNOSIS — G4700 Insomnia, unspecified: Secondary | ICD-10-CM | POA: Diagnosis not present

## 2021-08-13 DIAGNOSIS — N1832 Chronic kidney disease, stage 3b: Secondary | ICD-10-CM | POA: Diagnosis not present

## 2021-08-13 DIAGNOSIS — M21372 Foot drop, left foot: Secondary | ICD-10-CM | POA: Diagnosis not present

## 2021-08-13 DIAGNOSIS — I471 Supraventricular tachycardia: Secondary | ICD-10-CM | POA: Diagnosis not present

## 2021-08-13 DIAGNOSIS — I87303 Chronic venous hypertension (idiopathic) without complications of bilateral lower extremity: Secondary | ICD-10-CM | POA: Diagnosis not present

## 2021-08-13 DIAGNOSIS — E1122 Type 2 diabetes mellitus with diabetic chronic kidney disease: Secondary | ICD-10-CM | POA: Diagnosis not present

## 2021-08-13 DIAGNOSIS — N1831 Chronic kidney disease, stage 3a: Secondary | ICD-10-CM | POA: Diagnosis not present

## 2021-08-13 DIAGNOSIS — I5032 Chronic diastolic (congestive) heart failure: Secondary | ICD-10-CM | POA: Diagnosis not present

## 2021-08-13 DIAGNOSIS — I110 Hypertensive heart disease with heart failure: Secondary | ICD-10-CM | POA: Diagnosis not present

## 2021-08-13 DIAGNOSIS — G4733 Obstructive sleep apnea (adult) (pediatric): Secondary | ICD-10-CM | POA: Diagnosis not present

## 2021-08-13 DIAGNOSIS — D631 Anemia in chronic kidney disease: Secondary | ICD-10-CM | POA: Diagnosis not present

## 2021-08-13 DIAGNOSIS — E1169 Type 2 diabetes mellitus with other specified complication: Secondary | ICD-10-CM | POA: Diagnosis not present

## 2021-08-13 DIAGNOSIS — M5033 Other cervical disc degeneration, cervicothoracic region: Secondary | ICD-10-CM | POA: Diagnosis not present

## 2021-08-13 DIAGNOSIS — I890 Lymphedema, not elsewhere classified: Secondary | ICD-10-CM | POA: Diagnosis not present

## 2021-08-13 DIAGNOSIS — M1990 Unspecified osteoarthritis, unspecified site: Secondary | ICD-10-CM | POA: Diagnosis not present

## 2021-08-13 DIAGNOSIS — E039 Hypothyroidism, unspecified: Secondary | ICD-10-CM | POA: Diagnosis not present

## 2021-08-13 DIAGNOSIS — E441 Mild protein-calorie malnutrition: Secondary | ICD-10-CM | POA: Diagnosis not present

## 2021-08-13 DIAGNOSIS — I2510 Atherosclerotic heart disease of native coronary artery without angina pectoris: Secondary | ICD-10-CM | POA: Diagnosis not present

## 2021-08-13 DIAGNOSIS — M25451 Effusion, right hip: Secondary | ICD-10-CM | POA: Diagnosis not present

## 2021-08-14 DIAGNOSIS — K5792 Diverticulitis of intestine, part unspecified, without perforation or abscess without bleeding: Secondary | ICD-10-CM | POA: Diagnosis not present

## 2021-08-14 DIAGNOSIS — M5030 Other cervical disc degeneration, unspecified cervical region: Secondary | ICD-10-CM | POA: Diagnosis not present

## 2021-08-14 DIAGNOSIS — G4700 Insomnia, unspecified: Secondary | ICD-10-CM | POA: Diagnosis not present

## 2021-08-14 DIAGNOSIS — T8453XD Infection and inflammatory reaction due to internal right knee prosthesis, subsequent encounter: Secondary | ICD-10-CM | POA: Diagnosis not present

## 2021-08-14 DIAGNOSIS — N1832 Chronic kidney disease, stage 3b: Secondary | ICD-10-CM | POA: Diagnosis not present

## 2021-08-14 DIAGNOSIS — I471 Supraventricular tachycardia: Secondary | ICD-10-CM | POA: Diagnosis not present

## 2021-08-14 DIAGNOSIS — I110 Hypertensive heart disease with heart failure: Secondary | ICD-10-CM | POA: Diagnosis not present

## 2021-08-14 DIAGNOSIS — G4733 Obstructive sleep apnea (adult) (pediatric): Secondary | ICD-10-CM | POA: Diagnosis not present

## 2021-08-14 DIAGNOSIS — E1169 Type 2 diabetes mellitus with other specified complication: Secondary | ICD-10-CM | POA: Diagnosis not present

## 2021-08-14 DIAGNOSIS — D631 Anemia in chronic kidney disease: Secondary | ICD-10-CM | POA: Diagnosis not present

## 2021-08-14 DIAGNOSIS — I890 Lymphedema, not elsewhere classified: Secondary | ICD-10-CM | POA: Diagnosis not present

## 2021-08-14 DIAGNOSIS — M1990 Unspecified osteoarthritis, unspecified site: Secondary | ICD-10-CM | POA: Diagnosis not present

## 2021-08-14 DIAGNOSIS — M47812 Spondylosis without myelopathy or radiculopathy, cervical region: Secondary | ICD-10-CM | POA: Diagnosis not present

## 2021-08-14 DIAGNOSIS — E039 Hypothyroidism, unspecified: Secondary | ICD-10-CM | POA: Diagnosis not present

## 2021-08-14 DIAGNOSIS — I700 Atherosclerosis of aorta: Secondary | ICD-10-CM | POA: Diagnosis not present

## 2021-08-14 DIAGNOSIS — E785 Hyperlipidemia, unspecified: Secondary | ICD-10-CM | POA: Diagnosis not present

## 2021-08-14 DIAGNOSIS — I87303 Chronic venous hypertension (idiopathic) without complications of bilateral lower extremity: Secondary | ICD-10-CM | POA: Diagnosis not present

## 2021-08-14 DIAGNOSIS — E441 Mild protein-calorie malnutrition: Secondary | ICD-10-CM | POA: Diagnosis not present

## 2021-08-14 DIAGNOSIS — I5032 Chronic diastolic (congestive) heart failure: Secondary | ICD-10-CM | POA: Diagnosis not present

## 2021-08-14 DIAGNOSIS — D696 Thrombocytopenia, unspecified: Secondary | ICD-10-CM | POA: Diagnosis not present

## 2021-08-14 DIAGNOSIS — I2510 Atherosclerotic heart disease of native coronary artery without angina pectoris: Secondary | ICD-10-CM | POA: Diagnosis not present

## 2021-08-14 DIAGNOSIS — M25451 Effusion, right hip: Secondary | ICD-10-CM | POA: Diagnosis not present

## 2021-08-14 DIAGNOSIS — E1122 Type 2 diabetes mellitus with diabetic chronic kidney disease: Secondary | ICD-10-CM | POA: Diagnosis not present

## 2021-08-16 DIAGNOSIS — E039 Hypothyroidism, unspecified: Secondary | ICD-10-CM | POA: Diagnosis not present

## 2021-08-16 DIAGNOSIS — I5032 Chronic diastolic (congestive) heart failure: Secondary | ICD-10-CM | POA: Diagnosis not present

## 2021-08-16 DIAGNOSIS — G4733 Obstructive sleep apnea (adult) (pediatric): Secondary | ICD-10-CM | POA: Diagnosis not present

## 2021-08-16 DIAGNOSIS — I890 Lymphedema, not elsewhere classified: Secondary | ICD-10-CM | POA: Diagnosis not present

## 2021-08-16 DIAGNOSIS — I700 Atherosclerosis of aorta: Secondary | ICD-10-CM | POA: Diagnosis not present

## 2021-08-16 DIAGNOSIS — I110 Hypertensive heart disease with heart failure: Secondary | ICD-10-CM | POA: Diagnosis not present

## 2021-08-16 DIAGNOSIS — E785 Hyperlipidemia, unspecified: Secondary | ICD-10-CM | POA: Diagnosis not present

## 2021-08-16 DIAGNOSIS — M1990 Unspecified osteoarthritis, unspecified site: Secondary | ICD-10-CM | POA: Diagnosis not present

## 2021-08-16 DIAGNOSIS — E441 Mild protein-calorie malnutrition: Secondary | ICD-10-CM | POA: Diagnosis not present

## 2021-08-16 DIAGNOSIS — D696 Thrombocytopenia, unspecified: Secondary | ICD-10-CM | POA: Diagnosis not present

## 2021-08-16 DIAGNOSIS — N1832 Chronic kidney disease, stage 3b: Secondary | ICD-10-CM | POA: Diagnosis not present

## 2021-08-16 DIAGNOSIS — M5030 Other cervical disc degeneration, unspecified cervical region: Secondary | ICD-10-CM | POA: Diagnosis not present

## 2021-08-16 DIAGNOSIS — I2510 Atherosclerotic heart disease of native coronary artery without angina pectoris: Secondary | ICD-10-CM | POA: Diagnosis not present

## 2021-08-16 DIAGNOSIS — E1169 Type 2 diabetes mellitus with other specified complication: Secondary | ICD-10-CM | POA: Diagnosis not present

## 2021-08-16 DIAGNOSIS — I87303 Chronic venous hypertension (idiopathic) without complications of bilateral lower extremity: Secondary | ICD-10-CM | POA: Diagnosis not present

## 2021-08-16 DIAGNOSIS — T8453XD Infection and inflammatory reaction due to internal right knee prosthesis, subsequent encounter: Secondary | ICD-10-CM | POA: Diagnosis not present

## 2021-08-16 DIAGNOSIS — M47812 Spondylosis without myelopathy or radiculopathy, cervical region: Secondary | ICD-10-CM | POA: Diagnosis not present

## 2021-08-16 DIAGNOSIS — D631 Anemia in chronic kidney disease: Secondary | ICD-10-CM | POA: Diagnosis not present

## 2021-08-16 DIAGNOSIS — E1122 Type 2 diabetes mellitus with diabetic chronic kidney disease: Secondary | ICD-10-CM | POA: Diagnosis not present

## 2021-08-16 DIAGNOSIS — K5792 Diverticulitis of intestine, part unspecified, without perforation or abscess without bleeding: Secondary | ICD-10-CM | POA: Diagnosis not present

## 2021-08-16 DIAGNOSIS — I471 Supraventricular tachycardia: Secondary | ICD-10-CM | POA: Diagnosis not present

## 2021-08-16 DIAGNOSIS — G4700 Insomnia, unspecified: Secondary | ICD-10-CM | POA: Diagnosis not present

## 2021-08-16 DIAGNOSIS — M25451 Effusion, right hip: Secondary | ICD-10-CM | POA: Diagnosis not present

## 2021-08-20 ENCOUNTER — Other Ambulatory Visit: Payer: Self-pay

## 2021-08-20 ENCOUNTER — Telehealth: Payer: Self-pay

## 2021-08-20 DIAGNOSIS — T8453XD Infection and inflammatory reaction due to internal right knee prosthesis, subsequent encounter: Secondary | ICD-10-CM | POA: Diagnosis not present

## 2021-08-20 DIAGNOSIS — E1140 Type 2 diabetes mellitus with diabetic neuropathy, unspecified: Secondary | ICD-10-CM

## 2021-08-20 DIAGNOSIS — I110 Hypertensive heart disease with heart failure: Secondary | ICD-10-CM | POA: Diagnosis not present

## 2021-08-20 DIAGNOSIS — I87303 Chronic venous hypertension (idiopathic) without complications of bilateral lower extremity: Secondary | ICD-10-CM | POA: Diagnosis not present

## 2021-08-20 DIAGNOSIS — I700 Atherosclerosis of aorta: Secondary | ICD-10-CM | POA: Diagnosis not present

## 2021-08-20 DIAGNOSIS — E039 Hypothyroidism, unspecified: Secondary | ICD-10-CM | POA: Diagnosis not present

## 2021-08-20 DIAGNOSIS — E1169 Type 2 diabetes mellitus with other specified complication: Secondary | ICD-10-CM | POA: Diagnosis not present

## 2021-08-20 DIAGNOSIS — E1122 Type 2 diabetes mellitus with diabetic chronic kidney disease: Secondary | ICD-10-CM

## 2021-08-20 DIAGNOSIS — D631 Anemia in chronic kidney disease: Secondary | ICD-10-CM | POA: Diagnosis not present

## 2021-08-20 DIAGNOSIS — G4733 Obstructive sleep apnea (adult) (pediatric): Secondary | ICD-10-CM | POA: Diagnosis not present

## 2021-08-20 DIAGNOSIS — I471 Supraventricular tachycardia: Secondary | ICD-10-CM | POA: Diagnosis not present

## 2021-08-20 DIAGNOSIS — I2510 Atherosclerotic heart disease of native coronary artery without angina pectoris: Secondary | ICD-10-CM | POA: Diagnosis not present

## 2021-08-20 DIAGNOSIS — G4700 Insomnia, unspecified: Secondary | ICD-10-CM | POA: Diagnosis not present

## 2021-08-20 DIAGNOSIS — N1832 Chronic kidney disease, stage 3b: Secondary | ICD-10-CM | POA: Diagnosis not present

## 2021-08-20 DIAGNOSIS — N183 Chronic kidney disease, stage 3 unspecified: Secondary | ICD-10-CM

## 2021-08-20 DIAGNOSIS — M5030 Other cervical disc degeneration, unspecified cervical region: Secondary | ICD-10-CM | POA: Diagnosis not present

## 2021-08-20 DIAGNOSIS — M47812 Spondylosis without myelopathy or radiculopathy, cervical region: Secondary | ICD-10-CM | POA: Diagnosis not present

## 2021-08-20 DIAGNOSIS — I890 Lymphedema, not elsewhere classified: Secondary | ICD-10-CM | POA: Diagnosis not present

## 2021-08-20 DIAGNOSIS — K5792 Diverticulitis of intestine, part unspecified, without perforation or abscess without bleeding: Secondary | ICD-10-CM | POA: Diagnosis not present

## 2021-08-20 DIAGNOSIS — M25451 Effusion, right hip: Secondary | ICD-10-CM | POA: Diagnosis not present

## 2021-08-20 DIAGNOSIS — D696 Thrombocytopenia, unspecified: Secondary | ICD-10-CM | POA: Diagnosis not present

## 2021-08-20 DIAGNOSIS — M1990 Unspecified osteoarthritis, unspecified site: Secondary | ICD-10-CM | POA: Diagnosis not present

## 2021-08-20 DIAGNOSIS — E785 Hyperlipidemia, unspecified: Secondary | ICD-10-CM | POA: Diagnosis not present

## 2021-08-20 DIAGNOSIS — I5032 Chronic diastolic (congestive) heart failure: Secondary | ICD-10-CM | POA: Diagnosis not present

## 2021-08-20 DIAGNOSIS — E441 Mild protein-calorie malnutrition: Secondary | ICD-10-CM | POA: Diagnosis not present

## 2021-08-20 MED ORDER — ACCU-CHEK SOFTCLIX LANCETS MISC: 12 refills | Status: DC

## 2021-08-20 MED ORDER — ACCU-CHEK AVIVA PLUS W/DEVICE KIT: 1.0000 | PACK | Freq: Once | 0 refills | Status: DC

## 2021-08-20 NOTE — Telephone Encounter (Unsigned)
Copied from [place] [id number]. Topic: General - Other >> Aug 19, 2021  2:21 PM Tessa Lerner A wrote: Reason for CRM: The patient has been recently contacted by someone regarding a health screening   The caller told the patient that it was in conjuncture with the practice  They called from [phone] and said that they were working with the practice  Please contact further when possible

## 2021-08-20 NOTE — Telephone Encounter (Signed)
Spoke with patient and let her know it was more than likely her insurance company calling to do an over the phone wellness and to reach out to them. She was in agreement and had no further questions or concerns to express at this time.

## 2021-08-20 NOTE — Telephone Encounter (Signed)
Pt requesting a kit to check her sugar levels. She want the monitor, needles and strips. Stated hers ran out last yr. Was told to check it periodically. [facility]  Would like to know if she refuse house calls from [facility] will they cancel her insurance

## 2021-08-21 ENCOUNTER — Other Ambulatory Visit: Payer: Self-pay | Admitting: Family Medicine

## 2021-08-21 DIAGNOSIS — H40153 Residual stage of open-angle glaucoma, bilateral: Secondary | ICD-10-CM | POA: Diagnosis not present

## 2021-08-21 DIAGNOSIS — E441 Mild protein-calorie malnutrition: Secondary | ICD-10-CM | POA: Diagnosis not present

## 2021-08-21 DIAGNOSIS — N1832 Chronic kidney disease, stage 3b: Secondary | ICD-10-CM | POA: Diagnosis not present

## 2021-08-21 DIAGNOSIS — G4700 Insomnia, unspecified: Secondary | ICD-10-CM | POA: Diagnosis not present

## 2021-08-21 DIAGNOSIS — M47812 Spondylosis without myelopathy or radiculopathy, cervical region: Secondary | ICD-10-CM | POA: Diagnosis not present

## 2021-08-21 DIAGNOSIS — G4733 Obstructive sleep apnea (adult) (pediatric): Secondary | ICD-10-CM | POA: Diagnosis not present

## 2021-08-21 DIAGNOSIS — T8453XD Infection and inflammatory reaction due to internal right knee prosthesis, subsequent encounter: Secondary | ICD-10-CM | POA: Diagnosis not present

## 2021-08-21 DIAGNOSIS — D631 Anemia in chronic kidney disease: Secondary | ICD-10-CM | POA: Diagnosis not present

## 2021-08-21 DIAGNOSIS — M1990 Unspecified osteoarthritis, unspecified site: Secondary | ICD-10-CM | POA: Diagnosis not present

## 2021-08-21 DIAGNOSIS — I471 Supraventricular tachycardia: Secondary | ICD-10-CM | POA: Diagnosis not present

## 2021-08-21 DIAGNOSIS — I110 Hypertensive heart disease with heart failure: Secondary | ICD-10-CM | POA: Diagnosis not present

## 2021-08-21 DIAGNOSIS — M25451 Effusion, right hip: Secondary | ICD-10-CM | POA: Diagnosis not present

## 2021-08-21 DIAGNOSIS — I5032 Chronic diastolic (congestive) heart failure: Secondary | ICD-10-CM | POA: Diagnosis not present

## 2021-08-21 DIAGNOSIS — I2510 Atherosclerotic heart disease of native coronary artery without angina pectoris: Secondary | ICD-10-CM | POA: Diagnosis not present

## 2021-08-21 DIAGNOSIS — L03115 Cellulitis of right lower limb: Secondary | ICD-10-CM | POA: Diagnosis not present

## 2021-08-21 DIAGNOSIS — I87303 Chronic venous hypertension (idiopathic) without complications of bilateral lower extremity: Secondary | ICD-10-CM | POA: Diagnosis not present

## 2021-08-21 DIAGNOSIS — I700 Atherosclerosis of aorta: Secondary | ICD-10-CM | POA: Diagnosis not present

## 2021-08-21 DIAGNOSIS — D696 Thrombocytopenia, unspecified: Secondary | ICD-10-CM | POA: Diagnosis not present

## 2021-08-21 DIAGNOSIS — E785 Hyperlipidemia, unspecified: Secondary | ICD-10-CM | POA: Diagnosis not present

## 2021-08-21 DIAGNOSIS — I890 Lymphedema, not elsewhere classified: Secondary | ICD-10-CM | POA: Diagnosis not present

## 2021-08-21 DIAGNOSIS — E1122 Type 2 diabetes mellitus with diabetic chronic kidney disease: Secondary | ICD-10-CM | POA: Diagnosis not present

## 2021-08-21 DIAGNOSIS — K5792 Diverticulitis of intestine, part unspecified, without perforation or abscess without bleeding: Secondary | ICD-10-CM | POA: Diagnosis not present

## 2021-08-21 DIAGNOSIS — M5030 Other cervical disc degeneration, unspecified cervical region: Secondary | ICD-10-CM | POA: Diagnosis not present

## 2021-08-21 DIAGNOSIS — E039 Hypothyroidism, unspecified: Secondary | ICD-10-CM | POA: Diagnosis not present

## 2021-08-21 DIAGNOSIS — E1169 Type 2 diabetes mellitus with other specified complication: Secondary | ICD-10-CM | POA: Diagnosis not present

## 2021-08-23 ENCOUNTER — Other Ambulatory Visit: Payer: Self-pay | Admitting: Family Medicine

## 2021-08-23 DIAGNOSIS — G4733 Obstructive sleep apnea (adult) (pediatric): Secondary | ICD-10-CM | POA: Diagnosis not present

## 2021-08-23 DIAGNOSIS — N1832 Chronic kidney disease, stage 3b: Secondary | ICD-10-CM | POA: Diagnosis not present

## 2021-08-23 DIAGNOSIS — E039 Hypothyroidism, unspecified: Secondary | ICD-10-CM | POA: Diagnosis not present

## 2021-08-23 DIAGNOSIS — D696 Thrombocytopenia, unspecified: Secondary | ICD-10-CM | POA: Diagnosis not present

## 2021-08-23 DIAGNOSIS — T8453XD Infection and inflammatory reaction due to internal right knee prosthesis, subsequent encounter: Secondary | ICD-10-CM | POA: Diagnosis not present

## 2021-08-23 DIAGNOSIS — E1169 Type 2 diabetes mellitus with other specified complication: Secondary | ICD-10-CM | POA: Diagnosis not present

## 2021-08-23 DIAGNOSIS — F064 Anxiety disorder due to known physiological condition: Secondary | ICD-10-CM

## 2021-08-23 DIAGNOSIS — M25451 Effusion, right hip: Secondary | ICD-10-CM | POA: Diagnosis not present

## 2021-08-23 DIAGNOSIS — K5792 Diverticulitis of intestine, part unspecified, without perforation or abscess without bleeding: Secondary | ICD-10-CM | POA: Diagnosis not present

## 2021-08-23 DIAGNOSIS — M5030 Other cervical disc degeneration, unspecified cervical region: Secondary | ICD-10-CM | POA: Diagnosis not present

## 2021-08-23 DIAGNOSIS — I471 Supraventricular tachycardia: Secondary | ICD-10-CM | POA: Diagnosis not present

## 2021-08-23 DIAGNOSIS — F410 Panic disorder [episodic paroxysmal anxiety] without agoraphobia: Secondary | ICD-10-CM

## 2021-08-23 DIAGNOSIS — I5032 Chronic diastolic (congestive) heart failure: Secondary | ICD-10-CM | POA: Diagnosis not present

## 2021-08-23 DIAGNOSIS — G4700 Insomnia, unspecified: Secondary | ICD-10-CM | POA: Diagnosis not present

## 2021-08-23 DIAGNOSIS — I890 Lymphedema, not elsewhere classified: Secondary | ICD-10-CM | POA: Diagnosis not present

## 2021-08-23 DIAGNOSIS — E785 Hyperlipidemia, unspecified: Secondary | ICD-10-CM | POA: Diagnosis not present

## 2021-08-23 DIAGNOSIS — E441 Mild protein-calorie malnutrition: Secondary | ICD-10-CM | POA: Diagnosis not present

## 2021-08-23 DIAGNOSIS — I110 Hypertensive heart disease with heart failure: Secondary | ICD-10-CM | POA: Diagnosis not present

## 2021-08-23 DIAGNOSIS — D631 Anemia in chronic kidney disease: Secondary | ICD-10-CM | POA: Diagnosis not present

## 2021-08-23 DIAGNOSIS — I700 Atherosclerosis of aorta: Secondary | ICD-10-CM | POA: Diagnosis not present

## 2021-08-23 DIAGNOSIS — I2510 Atherosclerotic heart disease of native coronary artery without angina pectoris: Secondary | ICD-10-CM | POA: Diagnosis not present

## 2021-08-23 DIAGNOSIS — I87303 Chronic venous hypertension (idiopathic) without complications of bilateral lower extremity: Secondary | ICD-10-CM | POA: Diagnosis not present

## 2021-08-23 DIAGNOSIS — M47812 Spondylosis without myelopathy or radiculopathy, cervical region: Secondary | ICD-10-CM | POA: Diagnosis not present

## 2021-08-23 DIAGNOSIS — M1990 Unspecified osteoarthritis, unspecified site: Secondary | ICD-10-CM | POA: Diagnosis not present

## 2021-08-23 DIAGNOSIS — E1122 Type 2 diabetes mellitus with diabetic chronic kidney disease: Secondary | ICD-10-CM | POA: Diagnosis not present

## 2021-08-26 ENCOUNTER — Other Ambulatory Visit: Payer: Self-pay | Admitting: Family Medicine

## 2021-08-28 ENCOUNTER — Telehealth: Payer: Self-pay

## 2021-08-28 DIAGNOSIS — E441 Mild protein-calorie malnutrition: Secondary | ICD-10-CM | POA: Diagnosis not present

## 2021-08-28 DIAGNOSIS — D631 Anemia in chronic kidney disease: Secondary | ICD-10-CM | POA: Diagnosis not present

## 2021-08-28 DIAGNOSIS — I110 Hypertensive heart disease with heart failure: Secondary | ICD-10-CM | POA: Diagnosis not present

## 2021-08-28 DIAGNOSIS — T8453XD Infection and inflammatory reaction due to internal right knee prosthesis, subsequent encounter: Secondary | ICD-10-CM | POA: Diagnosis not present

## 2021-08-28 DIAGNOSIS — G4700 Insomnia, unspecified: Secondary | ICD-10-CM | POA: Diagnosis not present

## 2021-08-28 DIAGNOSIS — E1122 Type 2 diabetes mellitus with diabetic chronic kidney disease: Secondary | ICD-10-CM | POA: Diagnosis not present

## 2021-08-28 DIAGNOSIS — M5030 Other cervical disc degeneration, unspecified cervical region: Secondary | ICD-10-CM | POA: Diagnosis not present

## 2021-08-28 DIAGNOSIS — E785 Hyperlipidemia, unspecified: Secondary | ICD-10-CM | POA: Diagnosis not present

## 2021-08-28 DIAGNOSIS — I890 Lymphedema, not elsewhere classified: Secondary | ICD-10-CM | POA: Diagnosis not present

## 2021-08-28 DIAGNOSIS — I87303 Chronic venous hypertension (idiopathic) without complications of bilateral lower extremity: Secondary | ICD-10-CM | POA: Diagnosis not present

## 2021-08-28 DIAGNOSIS — G4733 Obstructive sleep apnea (adult) (pediatric): Secondary | ICD-10-CM | POA: Diagnosis not present

## 2021-08-28 DIAGNOSIS — M1990 Unspecified osteoarthritis, unspecified site: Secondary | ICD-10-CM | POA: Diagnosis not present

## 2021-08-28 DIAGNOSIS — E039 Hypothyroidism, unspecified: Secondary | ICD-10-CM | POA: Diagnosis not present

## 2021-08-28 DIAGNOSIS — L03115 Cellulitis of right lower limb: Secondary | ICD-10-CM | POA: Diagnosis not present

## 2021-08-28 DIAGNOSIS — M25451 Effusion, right hip: Secondary | ICD-10-CM | POA: Diagnosis not present

## 2021-08-28 DIAGNOSIS — I2510 Atherosclerotic heart disease of native coronary artery without angina pectoris: Secondary | ICD-10-CM | POA: Diagnosis not present

## 2021-08-28 DIAGNOSIS — I5032 Chronic diastolic (congestive) heart failure: Secondary | ICD-10-CM | POA: Diagnosis not present

## 2021-08-28 DIAGNOSIS — E1169 Type 2 diabetes mellitus with other specified complication: Secondary | ICD-10-CM | POA: Diagnosis not present

## 2021-08-28 DIAGNOSIS — I471 Supraventricular tachycardia: Secondary | ICD-10-CM | POA: Diagnosis not present

## 2021-08-28 DIAGNOSIS — M47812 Spondylosis without myelopathy or radiculopathy, cervical region: Secondary | ICD-10-CM | POA: Diagnosis not present

## 2021-08-28 DIAGNOSIS — K5792 Diverticulitis of intestine, part unspecified, without perforation or abscess without bleeding: Secondary | ICD-10-CM | POA: Diagnosis not present

## 2021-08-28 DIAGNOSIS — N1832 Chronic kidney disease, stage 3b: Secondary | ICD-10-CM | POA: Diagnosis not present

## 2021-08-28 DIAGNOSIS — D696 Thrombocytopenia, unspecified: Secondary | ICD-10-CM | POA: Diagnosis not present

## 2021-08-28 DIAGNOSIS — I700 Atherosclerosis of aorta: Secondary | ICD-10-CM | POA: Diagnosis not present

## 2021-08-28 NOTE — Telephone Encounter (Signed)
Copied from [place] [phone]. Topic: General - Inquiry >> Aug 28, 2021 12:03 PM Loma [place] wrote: Reason for CRM: Pt has called wanting [name] to return call at [phone]. Tried to help pt but she is calling about 2 meds from last week that were to be ordered but does not know the name of them.  FU to advise

## 2021-08-29 NOTE — Telephone Encounter (Signed)
Patient called back, she says the doesn't know the name of the medicine, but its cough medicine, little white pearls and also needs meter machine

## 2021-08-30 DIAGNOSIS — M47812 Spondylosis without myelopathy or radiculopathy, cervical region: Secondary | ICD-10-CM | POA: Diagnosis not present

## 2021-08-30 DIAGNOSIS — M1990 Unspecified osteoarthritis, unspecified site: Secondary | ICD-10-CM | POA: Diagnosis not present

## 2021-08-30 DIAGNOSIS — E785 Hyperlipidemia, unspecified: Secondary | ICD-10-CM | POA: Diagnosis not present

## 2021-08-30 DIAGNOSIS — T8453XD Infection and inflammatory reaction due to internal right knee prosthesis, subsequent encounter: Secondary | ICD-10-CM | POA: Diagnosis not present

## 2021-08-30 DIAGNOSIS — I471 Supraventricular tachycardia: Secondary | ICD-10-CM | POA: Diagnosis not present

## 2021-08-30 DIAGNOSIS — E441 Mild protein-calorie malnutrition: Secondary | ICD-10-CM | POA: Diagnosis not present

## 2021-08-30 DIAGNOSIS — D696 Thrombocytopenia, unspecified: Secondary | ICD-10-CM | POA: Diagnosis not present

## 2021-08-30 DIAGNOSIS — N1832 Chronic kidney disease, stage 3b: Secondary | ICD-10-CM | POA: Diagnosis not present

## 2021-08-30 DIAGNOSIS — G4700 Insomnia, unspecified: Secondary | ICD-10-CM | POA: Diagnosis not present

## 2021-08-30 DIAGNOSIS — I2510 Atherosclerotic heart disease of native coronary artery without angina pectoris: Secondary | ICD-10-CM | POA: Diagnosis not present

## 2021-08-30 DIAGNOSIS — I110 Hypertensive heart disease with heart failure: Secondary | ICD-10-CM | POA: Diagnosis not present

## 2021-08-30 DIAGNOSIS — D631 Anemia in chronic kidney disease: Secondary | ICD-10-CM | POA: Diagnosis not present

## 2021-08-30 DIAGNOSIS — E1122 Type 2 diabetes mellitus with diabetic chronic kidney disease: Secondary | ICD-10-CM | POA: Diagnosis not present

## 2021-08-30 DIAGNOSIS — M5030 Other cervical disc degeneration, unspecified cervical region: Secondary | ICD-10-CM | POA: Diagnosis not present

## 2021-08-30 DIAGNOSIS — I890 Lymphedema, not elsewhere classified: Secondary | ICD-10-CM | POA: Diagnosis not present

## 2021-08-30 DIAGNOSIS — I700 Atherosclerosis of aorta: Secondary | ICD-10-CM | POA: Diagnosis not present

## 2021-08-30 DIAGNOSIS — E1169 Type 2 diabetes mellitus with other specified complication: Secondary | ICD-10-CM | POA: Diagnosis not present

## 2021-08-30 DIAGNOSIS — M25451 Effusion, right hip: Secondary | ICD-10-CM | POA: Diagnosis not present

## 2021-08-30 DIAGNOSIS — K5792 Diverticulitis of intestine, part unspecified, without perforation or abscess without bleeding: Secondary | ICD-10-CM | POA: Diagnosis not present

## 2021-08-30 DIAGNOSIS — G4733 Obstructive sleep apnea (adult) (pediatric): Secondary | ICD-10-CM | POA: Diagnosis not present

## 2021-08-30 DIAGNOSIS — E039 Hypothyroidism, unspecified: Secondary | ICD-10-CM | POA: Diagnosis not present

## 2021-08-30 DIAGNOSIS — I5032 Chronic diastolic (congestive) heart failure: Secondary | ICD-10-CM | POA: Diagnosis not present

## 2021-08-30 DIAGNOSIS — I87303 Chronic venous hypertension (idiopathic) without complications of bilateral lower extremity: Secondary | ICD-10-CM | POA: Diagnosis not present

## 2021-08-30 LAB — ACID FAST CULTURE WITH REFLEXED SENSITIVITIES (MYCOBACTERIA): Acid Fast Culture: NEGATIVE

## 2021-08-30 NOTE — Telephone Encounter (Signed)
Becky Trevino ordered kit and supplies 08/20/21

## 2021-09-02 ENCOUNTER — Encounter (INDEPENDENT_AMBULATORY_CARE_PROVIDER_SITE_OTHER): Payer: Medicare Other

## 2021-09-02 ENCOUNTER — Other Ambulatory Visit: Payer: Self-pay | Admitting: Family Medicine

## 2021-09-02 ENCOUNTER — Telehealth: Payer: Self-pay

## 2021-09-02 ENCOUNTER — Ambulatory Visit (INDEPENDENT_AMBULATORY_CARE_PROVIDER_SITE_OTHER): Payer: Medicare Other | Admitting: Vascular Surgery

## 2021-09-02 DIAGNOSIS — E1169 Type 2 diabetes mellitus with other specified complication: Secondary | ICD-10-CM | POA: Diagnosis not present

## 2021-09-02 DIAGNOSIS — I700 Atherosclerosis of aorta: Secondary | ICD-10-CM | POA: Diagnosis not present

## 2021-09-02 DIAGNOSIS — I110 Hypertensive heart disease with heart failure: Secondary | ICD-10-CM | POA: Diagnosis not present

## 2021-09-02 DIAGNOSIS — D696 Thrombocytopenia, unspecified: Secondary | ICD-10-CM | POA: Diagnosis not present

## 2021-09-02 DIAGNOSIS — E1122 Type 2 diabetes mellitus with diabetic chronic kidney disease: Secondary | ICD-10-CM | POA: Diagnosis not present

## 2021-09-02 DIAGNOSIS — G4733 Obstructive sleep apnea (adult) (pediatric): Secondary | ICD-10-CM | POA: Diagnosis not present

## 2021-09-02 DIAGNOSIS — D631 Anemia in chronic kidney disease: Secondary | ICD-10-CM | POA: Diagnosis not present

## 2021-09-02 DIAGNOSIS — K5792 Diverticulitis of intestine, part unspecified, without perforation or abscess without bleeding: Secondary | ICD-10-CM | POA: Diagnosis not present

## 2021-09-02 DIAGNOSIS — E441 Mild protein-calorie malnutrition: Secondary | ICD-10-CM | POA: Diagnosis not present

## 2021-09-02 DIAGNOSIS — M1990 Unspecified osteoarthritis, unspecified site: Secondary | ICD-10-CM | POA: Diagnosis not present

## 2021-09-02 DIAGNOSIS — E039 Hypothyroidism, unspecified: Secondary | ICD-10-CM | POA: Diagnosis not present

## 2021-09-02 DIAGNOSIS — I5032 Chronic diastolic (congestive) heart failure: Secondary | ICD-10-CM | POA: Diagnosis not present

## 2021-09-02 DIAGNOSIS — E785 Hyperlipidemia, unspecified: Secondary | ICD-10-CM | POA: Diagnosis not present

## 2021-09-02 DIAGNOSIS — I2510 Atherosclerotic heart disease of native coronary artery without angina pectoris: Secondary | ICD-10-CM | POA: Diagnosis not present

## 2021-09-02 DIAGNOSIS — I87303 Chronic venous hypertension (idiopathic) without complications of bilateral lower extremity: Secondary | ICD-10-CM | POA: Diagnosis not present

## 2021-09-02 DIAGNOSIS — G4700 Insomnia, unspecified: Secondary | ICD-10-CM | POA: Diagnosis not present

## 2021-09-02 DIAGNOSIS — I890 Lymphedema, not elsewhere classified: Secondary | ICD-10-CM | POA: Diagnosis not present

## 2021-09-02 DIAGNOSIS — N1832 Chronic kidney disease, stage 3b: Secondary | ICD-10-CM | POA: Diagnosis not present

## 2021-09-02 DIAGNOSIS — M25451 Effusion, right hip: Secondary | ICD-10-CM | POA: Diagnosis not present

## 2021-09-02 DIAGNOSIS — M47812 Spondylosis without myelopathy or radiculopathy, cervical region: Secondary | ICD-10-CM | POA: Diagnosis not present

## 2021-09-02 DIAGNOSIS — I471 Supraventricular tachycardia: Secondary | ICD-10-CM | POA: Diagnosis not present

## 2021-09-02 DIAGNOSIS — T8453XD Infection and inflammatory reaction due to internal right knee prosthesis, subsequent encounter: Secondary | ICD-10-CM | POA: Diagnosis not present

## 2021-09-02 DIAGNOSIS — M5030 Other cervical disc degeneration, unspecified cervical region: Secondary | ICD-10-CM | POA: Diagnosis not present

## 2021-09-02 NOTE — Telephone Encounter (Signed)
Copied from [place] [phone]. Topic: General - Other >> Sep 02, 2021 10:30 AM Yvette Rack wrote: Reason for CRM: Pt requests that Alem Fahl return her call. Cb# [phone] >> Sep 02, 2021  2:28 PM Karene Fry wrote: Returned pt call lvm

## 2021-09-02 NOTE — Telephone Encounter (Signed)
1) Pt stated that she never received her tessalon pearls. 2)She would also like to know where she could get a weight scale and if you could order her one.  3) Dr Ancil Boozer sent the prescription in for the meter but pharmacy said they no longer make these that Dr Ancil Boozer would have to write a prescription for a newer one.

## 2021-09-03 ENCOUNTER — Other Ambulatory Visit: Payer: Self-pay | Admitting: Family Medicine

## 2021-09-03 ENCOUNTER — Other Ambulatory Visit: Payer: Self-pay

## 2021-09-03 DIAGNOSIS — E1122 Type 2 diabetes mellitus with diabetic chronic kidney disease: Secondary | ICD-10-CM

## 2021-09-03 DIAGNOSIS — E1140 Type 2 diabetes mellitus with diabetic neuropathy, unspecified: Secondary | ICD-10-CM

## 2021-09-03 MED ORDER — ACCU-CHEK GUIDE ME W/DEVICE KIT: 1.0000 | PACK | Freq: Once | 0 refills | Status: DC

## 2021-09-03 MED ORDER — BENZONATATE 100 MG PO CAPS: 100.0000 mg | ORAL_CAPSULE | Freq: Three times a day (TID) | ORAL | 0 refills | Status: DC | PRN

## 2021-09-04 DIAGNOSIS — I10 Essential (primary) hypertension: Secondary | ICD-10-CM | POA: Diagnosis not present

## 2021-09-04 DIAGNOSIS — I480 Paroxysmal atrial fibrillation: Secondary | ICD-10-CM | POA: Diagnosis not present

## 2021-09-04 DIAGNOSIS — I272 Pulmonary hypertension, unspecified: Secondary | ICD-10-CM | POA: Diagnosis not present

## 2021-09-04 DIAGNOSIS — I471 Supraventricular tachycardia: Secondary | ICD-10-CM | POA: Diagnosis not present

## 2021-09-04 DIAGNOSIS — G4730 Sleep apnea, unspecified: Secondary | ICD-10-CM | POA: Diagnosis not present

## 2021-09-04 DIAGNOSIS — N183 Chronic kidney disease, stage 3 unspecified: Secondary | ICD-10-CM | POA: Diagnosis not present

## 2021-09-04 DIAGNOSIS — E119 Type 2 diabetes mellitus without complications: Secondary | ICD-10-CM | POA: Diagnosis not present

## 2021-09-04 DIAGNOSIS — E782 Mixed hyperlipidemia: Secondary | ICD-10-CM | POA: Diagnosis not present

## 2021-09-04 DIAGNOSIS — I825Z1 Chronic embolism and thrombosis of unspecified deep veins of right distal lower extremity: Secondary | ICD-10-CM | POA: Diagnosis not present

## 2021-09-04 DIAGNOSIS — I700 Atherosclerosis of aorta: Secondary | ICD-10-CM | POA: Diagnosis not present

## 2021-09-06 DIAGNOSIS — G4700 Insomnia, unspecified: Secondary | ICD-10-CM | POA: Diagnosis not present

## 2021-09-06 DIAGNOSIS — E1169 Type 2 diabetes mellitus with other specified complication: Secondary | ICD-10-CM | POA: Diagnosis not present

## 2021-09-06 DIAGNOSIS — G4733 Obstructive sleep apnea (adult) (pediatric): Secondary | ICD-10-CM | POA: Diagnosis not present

## 2021-09-06 DIAGNOSIS — M25451 Effusion, right hip: Secondary | ICD-10-CM | POA: Diagnosis not present

## 2021-09-06 DIAGNOSIS — E785 Hyperlipidemia, unspecified: Secondary | ICD-10-CM | POA: Diagnosis not present

## 2021-09-06 DIAGNOSIS — I890 Lymphedema, not elsewhere classified: Secondary | ICD-10-CM | POA: Diagnosis not present

## 2021-09-06 DIAGNOSIS — D696 Thrombocytopenia, unspecified: Secondary | ICD-10-CM | POA: Diagnosis not present

## 2021-09-06 DIAGNOSIS — M5030 Other cervical disc degeneration, unspecified cervical region: Secondary | ICD-10-CM | POA: Diagnosis not present

## 2021-09-06 DIAGNOSIS — E441 Mild protein-calorie malnutrition: Secondary | ICD-10-CM | POA: Diagnosis not present

## 2021-09-06 DIAGNOSIS — M47812 Spondylosis without myelopathy or radiculopathy, cervical region: Secondary | ICD-10-CM | POA: Diagnosis not present

## 2021-09-06 DIAGNOSIS — I471 Supraventricular tachycardia: Secondary | ICD-10-CM | POA: Diagnosis not present

## 2021-09-06 DIAGNOSIS — E1122 Type 2 diabetes mellitus with diabetic chronic kidney disease: Secondary | ICD-10-CM | POA: Diagnosis not present

## 2021-09-06 DIAGNOSIS — N1832 Chronic kidney disease, stage 3b: Secondary | ICD-10-CM | POA: Diagnosis not present

## 2021-09-06 DIAGNOSIS — E039 Hypothyroidism, unspecified: Secondary | ICD-10-CM | POA: Diagnosis not present

## 2021-09-06 DIAGNOSIS — I5032 Chronic diastolic (congestive) heart failure: Secondary | ICD-10-CM | POA: Diagnosis not present

## 2021-09-06 DIAGNOSIS — T8453XD Infection and inflammatory reaction due to internal right knee prosthesis, subsequent encounter: Secondary | ICD-10-CM | POA: Diagnosis not present

## 2021-09-06 DIAGNOSIS — K5792 Diverticulitis of intestine, part unspecified, without perforation or abscess without bleeding: Secondary | ICD-10-CM | POA: Diagnosis not present

## 2021-09-06 DIAGNOSIS — I2510 Atherosclerotic heart disease of native coronary artery without angina pectoris: Secondary | ICD-10-CM | POA: Diagnosis not present

## 2021-09-06 DIAGNOSIS — I700 Atherosclerosis of aorta: Secondary | ICD-10-CM | POA: Diagnosis not present

## 2021-09-06 DIAGNOSIS — M1990 Unspecified osteoarthritis, unspecified site: Secondary | ICD-10-CM | POA: Diagnosis not present

## 2021-09-06 DIAGNOSIS — D631 Anemia in chronic kidney disease: Secondary | ICD-10-CM | POA: Diagnosis not present

## 2021-09-06 DIAGNOSIS — I87303 Chronic venous hypertension (idiopathic) without complications of bilateral lower extremity: Secondary | ICD-10-CM | POA: Diagnosis not present

## 2021-09-06 DIAGNOSIS — I110 Hypertensive heart disease with heart failure: Secondary | ICD-10-CM | POA: Diagnosis not present

## 2021-09-11 DIAGNOSIS — E1122 Type 2 diabetes mellitus with diabetic chronic kidney disease: Secondary | ICD-10-CM | POA: Diagnosis not present

## 2021-09-11 DIAGNOSIS — K5792 Diverticulitis of intestine, part unspecified, without perforation or abscess without bleeding: Secondary | ICD-10-CM | POA: Diagnosis not present

## 2021-09-11 DIAGNOSIS — G4733 Obstructive sleep apnea (adult) (pediatric): Secondary | ICD-10-CM | POA: Diagnosis not present

## 2021-09-11 DIAGNOSIS — I700 Atherosclerosis of aorta: Secondary | ICD-10-CM | POA: Diagnosis not present

## 2021-09-11 DIAGNOSIS — I890 Lymphedema, not elsewhere classified: Secondary | ICD-10-CM | POA: Diagnosis not present

## 2021-09-11 DIAGNOSIS — T8453XD Infection and inflammatory reaction due to internal right knee prosthesis, subsequent encounter: Secondary | ICD-10-CM | POA: Diagnosis not present

## 2021-09-11 DIAGNOSIS — M5030 Other cervical disc degeneration, unspecified cervical region: Secondary | ICD-10-CM | POA: Diagnosis not present

## 2021-09-11 DIAGNOSIS — D631 Anemia in chronic kidney disease: Secondary | ICD-10-CM | POA: Diagnosis not present

## 2021-09-11 DIAGNOSIS — N1832 Chronic kidney disease, stage 3b: Secondary | ICD-10-CM | POA: Diagnosis not present

## 2021-09-11 DIAGNOSIS — M1990 Unspecified osteoarthritis, unspecified site: Secondary | ICD-10-CM | POA: Diagnosis not present

## 2021-09-11 DIAGNOSIS — I110 Hypertensive heart disease with heart failure: Secondary | ICD-10-CM | POA: Diagnosis not present

## 2021-09-11 DIAGNOSIS — E785 Hyperlipidemia, unspecified: Secondary | ICD-10-CM | POA: Diagnosis not present

## 2021-09-11 DIAGNOSIS — I5032 Chronic diastolic (congestive) heart failure: Secondary | ICD-10-CM | POA: Diagnosis not present

## 2021-09-11 DIAGNOSIS — D696 Thrombocytopenia, unspecified: Secondary | ICD-10-CM | POA: Diagnosis not present

## 2021-09-11 DIAGNOSIS — E1169 Type 2 diabetes mellitus with other specified complication: Secondary | ICD-10-CM | POA: Diagnosis not present

## 2021-09-11 DIAGNOSIS — E039 Hypothyroidism, unspecified: Secondary | ICD-10-CM | POA: Diagnosis not present

## 2021-09-11 DIAGNOSIS — M25451 Effusion, right hip: Secondary | ICD-10-CM | POA: Diagnosis not present

## 2021-09-11 DIAGNOSIS — I471 Supraventricular tachycardia: Secondary | ICD-10-CM | POA: Diagnosis not present

## 2021-09-11 DIAGNOSIS — E441 Mild protein-calorie malnutrition: Secondary | ICD-10-CM | POA: Diagnosis not present

## 2021-09-11 DIAGNOSIS — M47812 Spondylosis without myelopathy or radiculopathy, cervical region: Secondary | ICD-10-CM | POA: Diagnosis not present

## 2021-09-11 DIAGNOSIS — I2510 Atherosclerotic heart disease of native coronary artery without angina pectoris: Secondary | ICD-10-CM | POA: Diagnosis not present

## 2021-09-11 DIAGNOSIS — G4700 Insomnia, unspecified: Secondary | ICD-10-CM | POA: Diagnosis not present

## 2021-09-11 DIAGNOSIS — I87303 Chronic venous hypertension (idiopathic) without complications of bilateral lower extremity: Secondary | ICD-10-CM | POA: Diagnosis not present

## 2021-09-12 DIAGNOSIS — N1831 Chronic kidney disease, stage 3a: Secondary | ICD-10-CM | POA: Diagnosis not present

## 2021-09-12 DIAGNOSIS — M5033 Other cervical disc degeneration, cervicothoracic region: Secondary | ICD-10-CM | POA: Diagnosis not present

## 2021-09-12 DIAGNOSIS — I890 Lymphedema, not elsewhere classified: Secondary | ICD-10-CM | POA: Diagnosis not present

## 2021-09-12 DIAGNOSIS — M1990 Unspecified osteoarthritis, unspecified site: Secondary | ICD-10-CM | POA: Diagnosis not present

## 2021-09-12 DIAGNOSIS — M21372 Foot drop, left foot: Secondary | ICD-10-CM | POA: Diagnosis not present

## 2021-09-13 ENCOUNTER — Ambulatory Visit: Payer: Self-pay

## 2021-09-13 DIAGNOSIS — M5030 Other cervical disc degeneration, unspecified cervical region: Secondary | ICD-10-CM | POA: Diagnosis not present

## 2021-09-13 DIAGNOSIS — D631 Anemia in chronic kidney disease: Secondary | ICD-10-CM | POA: Diagnosis not present

## 2021-09-13 DIAGNOSIS — I700 Atherosclerosis of aorta: Secondary | ICD-10-CM | POA: Diagnosis not present

## 2021-09-13 DIAGNOSIS — I2510 Atherosclerotic heart disease of native coronary artery without angina pectoris: Secondary | ICD-10-CM | POA: Diagnosis not present

## 2021-09-13 DIAGNOSIS — M1990 Unspecified osteoarthritis, unspecified site: Secondary | ICD-10-CM | POA: Diagnosis not present

## 2021-09-13 DIAGNOSIS — I110 Hypertensive heart disease with heart failure: Secondary | ICD-10-CM | POA: Diagnosis not present

## 2021-09-13 DIAGNOSIS — E039 Hypothyroidism, unspecified: Secondary | ICD-10-CM | POA: Diagnosis not present

## 2021-09-13 DIAGNOSIS — M25451 Effusion, right hip: Secondary | ICD-10-CM | POA: Diagnosis not present

## 2021-09-13 DIAGNOSIS — E785 Hyperlipidemia, unspecified: Secondary | ICD-10-CM | POA: Diagnosis not present

## 2021-09-13 DIAGNOSIS — I890 Lymphedema, not elsewhere classified: Secondary | ICD-10-CM | POA: Diagnosis not present

## 2021-09-13 DIAGNOSIS — M47812 Spondylosis without myelopathy or radiculopathy, cervical region: Secondary | ICD-10-CM | POA: Diagnosis not present

## 2021-09-13 DIAGNOSIS — G4733 Obstructive sleep apnea (adult) (pediatric): Secondary | ICD-10-CM | POA: Diagnosis not present

## 2021-09-13 DIAGNOSIS — T8453XD Infection and inflammatory reaction due to internal right knee prosthesis, subsequent encounter: Secondary | ICD-10-CM | POA: Diagnosis not present

## 2021-09-13 DIAGNOSIS — I471 Supraventricular tachycardia: Secondary | ICD-10-CM | POA: Diagnosis not present

## 2021-09-13 DIAGNOSIS — E441 Mild protein-calorie malnutrition: Secondary | ICD-10-CM | POA: Diagnosis not present

## 2021-09-13 DIAGNOSIS — I87303 Chronic venous hypertension (idiopathic) without complications of bilateral lower extremity: Secondary | ICD-10-CM | POA: Diagnosis not present

## 2021-09-13 DIAGNOSIS — I5032 Chronic diastolic (congestive) heart failure: Secondary | ICD-10-CM | POA: Diagnosis not present

## 2021-09-13 DIAGNOSIS — E1122 Type 2 diabetes mellitus with diabetic chronic kidney disease: Secondary | ICD-10-CM | POA: Diagnosis not present

## 2021-09-13 DIAGNOSIS — D696 Thrombocytopenia, unspecified: Secondary | ICD-10-CM | POA: Diagnosis not present

## 2021-09-13 DIAGNOSIS — K5792 Diverticulitis of intestine, part unspecified, without perforation or abscess without bleeding: Secondary | ICD-10-CM | POA: Diagnosis not present

## 2021-09-13 DIAGNOSIS — E1169 Type 2 diabetes mellitus with other specified complication: Secondary | ICD-10-CM | POA: Diagnosis not present

## 2021-09-13 DIAGNOSIS — G4700 Insomnia, unspecified: Secondary | ICD-10-CM | POA: Diagnosis not present

## 2021-09-13 DIAGNOSIS — N1832 Chronic kidney disease, stage 3b: Secondary | ICD-10-CM | POA: Diagnosis not present

## 2021-09-13 NOTE — Telephone Encounter (Signed)
Patient called, left VM to return the call to the office to speak to a nurse about symptoms.   Summary: Fever 98.1   Pt on the line stated she started with a fever last night around 99.00 currently is 98.1 no other symptoms..  Pt would like to speak to [facility], for advice on what to do if this continues.

## 2021-09-13 NOTE — Telephone Encounter (Signed)
Unable to reach patient after 3 attempts by [facility] NT, routing to the provider for resolution per protocol.

## 2021-09-14 ENCOUNTER — Emergency Department: Payer: Medicare Other

## 2021-09-14 ENCOUNTER — Other Ambulatory Visit: Payer: Self-pay

## 2021-09-14 ENCOUNTER — Inpatient Hospital Stay
Admission: EM | Admit: 2021-09-14 | Discharge: 2021-09-24 | DRG: 871 | Disposition: A | Discharge status: Skilled Nursing Facility | Payer: Medicare Other | Attending: Internal Medicine | Admitting: Internal Medicine

## 2021-09-14 DIAGNOSIS — I10 Essential (primary) hypertension: Secondary | ICD-10-CM

## 2021-09-14 DIAGNOSIS — Z7982 Long term (current) use of aspirin: Secondary | ICD-10-CM

## 2021-09-14 DIAGNOSIS — L03116 Cellulitis of left lower limb: Secondary | ICD-10-CM | POA: Diagnosis present

## 2021-09-14 DIAGNOSIS — E039 Hypothyroidism, unspecified: Secondary | ICD-10-CM | POA: Diagnosis present

## 2021-09-14 DIAGNOSIS — R609 Edema, unspecified: Secondary | ICD-10-CM | POA: Diagnosis not present

## 2021-09-14 DIAGNOSIS — R509 Fever, unspecified: Secondary | ICD-10-CM | POA: Diagnosis not present

## 2021-09-14 DIAGNOSIS — E871 Hypo-osmolality and hyponatremia: Secondary | ICD-10-CM | POA: Diagnosis not present

## 2021-09-14 DIAGNOSIS — B951 Streptococcus, group B, as the cause of diseases classified elsewhere: Secondary | ICD-10-CM | POA: Diagnosis not present

## 2021-09-14 DIAGNOSIS — I480 Paroxysmal atrial fibrillation: Secondary | ICD-10-CM | POA: Diagnosis not present

## 2021-09-14 DIAGNOSIS — R778 Other specified abnormalities of plasma proteins: Secondary | ICD-10-CM | POA: Diagnosis present

## 2021-09-14 DIAGNOSIS — Z86718 Personal history of other venous thrombosis and embolism: Secondary | ICD-10-CM

## 2021-09-14 DIAGNOSIS — A401 Sepsis due to streptococcus, group B: Principal | ICD-10-CM | POA: Diagnosis present

## 2021-09-14 DIAGNOSIS — Z79899 Other long term (current) drug therapy: Secondary | ICD-10-CM

## 2021-09-14 DIAGNOSIS — Z20822 Contact with and (suspected) exposure to covid-19: Secondary | ICD-10-CM | POA: Diagnosis present

## 2021-09-14 DIAGNOSIS — Z96653 Presence of artificial knee joint, bilateral: Secondary | ICD-10-CM | POA: Diagnosis present

## 2021-09-14 DIAGNOSIS — D631 Anemia in chronic kidney disease: Secondary | ICD-10-CM | POA: Diagnosis not present

## 2021-09-14 DIAGNOSIS — N1832 Chronic kidney disease, stage 3b: Secondary | ICD-10-CM | POA: Diagnosis present

## 2021-09-14 DIAGNOSIS — N179 Acute kidney failure, unspecified: Secondary | ICD-10-CM | POA: Diagnosis not present

## 2021-09-14 DIAGNOSIS — Z7901 Long term (current) use of anticoagulants: Secondary | ICD-10-CM

## 2021-09-14 DIAGNOSIS — G4733 Obstructive sleep apnea (adult) (pediatric): Secondary | ICD-10-CM | POA: Diagnosis present

## 2021-09-14 DIAGNOSIS — E785 Hyperlipidemia, unspecified: Secondary | ICD-10-CM | POA: Diagnosis present

## 2021-09-14 DIAGNOSIS — M79605 Pain in left leg: Secondary | ICD-10-CM | POA: Diagnosis not present

## 2021-09-14 DIAGNOSIS — E860 Dehydration: Secondary | ICD-10-CM | POA: Diagnosis present

## 2021-09-14 DIAGNOSIS — Z888 Allergy status to other drugs, medicaments and biological substances status: Secondary | ICD-10-CM

## 2021-09-14 DIAGNOSIS — I272 Pulmonary hypertension, unspecified: Secondary | ICD-10-CM | POA: Diagnosis not present

## 2021-09-14 DIAGNOSIS — E1169 Type 2 diabetes mellitus with other specified complication: Secondary | ICD-10-CM | POA: Diagnosis present

## 2021-09-14 DIAGNOSIS — Z743 Need for continuous supervision: Secondary | ICD-10-CM | POA: Diagnosis not present

## 2021-09-14 DIAGNOSIS — G9341 Metabolic encephalopathy: Secondary | ICD-10-CM | POA: Diagnosis present

## 2021-09-14 DIAGNOSIS — I129 Hypertensive chronic kidney disease with stage 1 through stage 4 chronic kidney disease, or unspecified chronic kidney disease: Secondary | ICD-10-CM | POA: Diagnosis not present

## 2021-09-14 DIAGNOSIS — M25451 Effusion, right hip: Secondary | ICD-10-CM | POA: Diagnosis not present

## 2021-09-14 DIAGNOSIS — J9811 Atelectasis: Secondary | ICD-10-CM | POA: Diagnosis not present

## 2021-09-14 DIAGNOSIS — R29898 Other symptoms and signs involving the musculoskeletal system: Secondary | ICD-10-CM | POA: Diagnosis not present

## 2021-09-14 DIAGNOSIS — Z83438 Family history of other disorder of lipoprotein metabolism and other lipidemia: Secondary | ICD-10-CM

## 2021-09-14 DIAGNOSIS — M00051 Staphylococcal arthritis, right hip: Secondary | ICD-10-CM | POA: Diagnosis not present

## 2021-09-14 DIAGNOSIS — Z8249 Family history of ischemic heart disease and other diseases of the circulatory system: Secondary | ICD-10-CM

## 2021-09-14 DIAGNOSIS — R7881 Bacteremia: Secondary | ICD-10-CM | POA: Diagnosis not present

## 2021-09-14 DIAGNOSIS — E869 Volume depletion, unspecified: Secondary | ICD-10-CM | POA: Diagnosis present

## 2021-09-14 DIAGNOSIS — E1122 Type 2 diabetes mellitus with diabetic chronic kidney disease: Secondary | ICD-10-CM | POA: Diagnosis not present

## 2021-09-14 DIAGNOSIS — Z981 Arthrodesis status: Secondary | ICD-10-CM

## 2021-09-14 DIAGNOSIS — Z79890 Hormone replacement therapy (postmenopausal): Secondary | ICD-10-CM

## 2021-09-14 DIAGNOSIS — R011 Cardiac murmur, unspecified: Secondary | ICD-10-CM | POA: Diagnosis present

## 2021-09-14 DIAGNOSIS — F32A Depression, unspecified: Secondary | ICD-10-CM | POA: Diagnosis present

## 2021-09-14 DIAGNOSIS — M25551 Pain in right hip: Secondary | ICD-10-CM | POA: Diagnosis not present

## 2021-09-14 DIAGNOSIS — R6889 Other general symptoms and signs: Secondary | ICD-10-CM | POA: Diagnosis not present

## 2021-09-14 DIAGNOSIS — Z833 Family history of diabetes mellitus: Secondary | ICD-10-CM

## 2021-09-14 DIAGNOSIS — Z95828 Presence of other vascular implants and grafts: Secondary | ICD-10-CM

## 2021-09-14 DIAGNOSIS — B955 Unspecified streptococcus as the cause of diseases classified elsewhere: Secondary | ICD-10-CM | POA: Diagnosis not present

## 2021-09-14 DIAGNOSIS — Z6841 Body Mass Index (BMI) 40.0 and over, adult: Secondary | ICD-10-CM

## 2021-09-14 DIAGNOSIS — I700 Atherosclerosis of aorta: Secondary | ICD-10-CM | POA: Diagnosis not present

## 2021-09-14 DIAGNOSIS — R7989 Other specified abnormal findings of blood chemistry: Secondary | ICD-10-CM | POA: Diagnosis present

## 2021-09-14 LAB — CBC WITH DIFFERENTIAL/PLATELET
Abs Immature Granulocytes: 0.03 10*3/uL (ref 0.00–0.07)
Basophils Absolute: 0 10*3/uL (ref 0.0–0.1)
Basophils Relative: 0 %
Eosinophils Absolute: 0 10*3/uL (ref 0.0–0.5)
Eosinophils Relative: 0 %
HCT: 37.3 % (ref 36.0–46.0)
Hemoglobin: 12 g/dL (ref 12.0–15.0)
Immature Granulocytes: 0 %
Lymphocytes Relative: 6 %
Lymphs Abs: 0.5 10*3/uL — ABNORMAL LOW (ref 0.7–4.0)
MCH: 31.6 pg (ref 26.0–34.0)
MCHC: 32.2 g/dL (ref 30.0–36.0)
MCV: 98.2 fL (ref 80.0–100.0)
Monocytes Absolute: 0.4 10*3/uL (ref 0.1–1.0)
Monocytes Relative: 5 %
Neutro Abs: 7.6 10*3/uL (ref 1.7–7.7)
Neutrophils Relative %: 89 %
Platelets: 137 10*3/uL — ABNORMAL LOW (ref 150–400)
RBC: 3.8 MIL/uL — ABNORMAL LOW (ref 3.87–5.11)
RDW: 18 % — ABNORMAL HIGH (ref 11.5–15.5)
WBC: 8.5 10*3/uL (ref 4.0–10.5)
nRBC: 0 % (ref 0.0–0.2)

## 2021-09-14 LAB — COMPREHENSIVE METABOLIC PANEL
ALT: 20 U/L (ref 0–44)
AST: 29 U/L (ref 15–41)
Albumin: 3.7 g/dL (ref 3.5–5.0)
Alkaline Phosphatase: 66 U/L (ref 38–126)
Anion gap: 10 (ref 5–15)
BUN: 23 mg/dL (ref 8–23)
CO2: 26 mmol/L (ref 22–32)
Calcium: 9.1 mg/dL (ref 8.9–10.3)
Chloride: 99 mmol/L (ref 98–111)
Creatinine, Ser: 1.51 mg/dL — ABNORMAL HIGH (ref 0.44–1.00)
GFR, Estimated: 36 mL/min — ABNORMAL LOW (ref >60)
Glucose, Bld: 106 mg/dL — ABNORMAL HIGH (ref 70–99)
Potassium: 3.8 mmol/L (ref 3.5–5.1)
Sodium: 135 mmol/L (ref 135–145)
Total Bilirubin: 1.1 mg/dL (ref 0.3–1.2)
Total Protein: 8.2 g/dL — ABNORMAL HIGH (ref 6.5–8.1)

## 2021-09-14 LAB — URINALYSIS, ROUTINE W REFLEX MICROSCOPIC
Bilirubin Urine: NEGATIVE
Glucose, UA: NEGATIVE mg/dL
Ketones, ur: NEGATIVE mg/dL
Leukocytes,Ua: NEGATIVE
Nitrite: NEGATIVE
Protein, ur: 100 mg/dL — AB
Specific Gravity, Urine: 1.024 (ref 1.005–1.030)
pH: 6 (ref 5.0–8.0)

## 2021-09-14 LAB — RESP PANEL BY RT-PCR (FLU A&B, COVID) ARPGX2
Influenza A by PCR: NEGATIVE
Influenza B by PCR: NEGATIVE
SARS Coronavirus 2 by RT PCR: NEGATIVE

## 2021-09-14 LAB — LACTIC ACID, PLASMA
Lactic Acid, Venous: 1.6 mmol/L (ref 0.5–1.9)
Lactic Acid, Venous: 1.6 mmol/L (ref 0.5–1.9)

## 2021-09-14 LAB — SEDIMENTATION RATE: Sed Rate: 2 mm/hr (ref 0–30)

## 2021-09-14 LAB — TROPONIN I (HIGH SENSITIVITY)
Troponin I (High Sensitivity): 17 ng/L (ref <18)
Troponin I (High Sensitivity): 20 ng/L — ABNORMAL HIGH (ref <18)

## 2021-09-14 LAB — PROCALCITONIN: Procalcitonin: 5.38 ng/mL

## 2021-09-14 MED ORDER — OXYCODONE-ACETAMINOPHEN 5-325 MG PO TABS
1.0000 | ORAL_TABLET | Freq: Once | ORAL | Status: AC
  Administered 2021-09-14: 1 via ORAL
  Filled 2021-09-14: qty 1

## 2021-09-14 NOTE — ED Notes (Signed)
Pt assisted to bathroom, pt required max 2 assist to [facility]. Urine sample obtained as well.  Pt's bra placed in belongings bag and returned to pt.

## 2021-09-14 NOTE — ED Notes (Addendum)
Pt placed on 2L of O2 due to sats dropping to 89% while falling asleep. Manuela Schwartz, [place] aware.

## 2021-09-14 NOTE — ED Provider Notes (Signed)
[facility] Emergency Department Provider Note   ____________________________________________   Event Date/Time   First MD Initiated Contact with Patient 09/14/21 2139     (approximate)  I have reviewed the triage vital signs and the nursing notes.   HISTORY  Chief Complaint Fever    HPI JELISSA ESPIRITU is a 75 y.o. female history of chronic kidney disease diabetes previous right knee surgery.  Patient reports she takes doxycycline daily for the last 2 years due to a chronic right knee infection.  Both patient and her husband report for about 2 to 3 days now she has had a fever and fatigue.  Notably she is felt very tired to the point that she really does not To get up to use her automated wheelchair today.  She is feels fatigued and has been having a fever  She also reports has been having increasing pain in her right hip, she sees Dr. Rudene Christians and she has been evaluated for possible hip replacement in the past but been told she needs to wait.  She does however report that that pain seem to been exacerbated here the last couple days as well.  Noted at triage to have oxygen saturation of 89% and was placed on nasal cannula.  Past Medical History:  Diagnosis Date   Cervical spondylosis    Chronic kidney disease    Stage 3   DDD (degenerative disc disease), cervical    [facility]   Diabetes mellitus without complication (HCC)    Diverticulosis    Hyperlipidemia    Hypertension    Intertrigo    Leg cramps    Leg varices    Obesity    OSA (obstructive sleep apnea)    Symptomatic menopausal or female climacteric states    Syncope     Patient Active Problem List   Diagnosis Date Noted   Effusion of right hip 07/18/2021   Primary osteoarthritis of right hip 07/18/2021   Right hip joint effusion    Atherosclerosis of aorta ([place]) 06/07/2021   Anasarca    Hypothyroidism    Tachyarrhythmia    Acute kidney injury superimposed on CKD (HCC)     Hypotension    Class 2 obesity without serious comorbidity with body mass index (BMI) of 39.0 to 39.9 in adult    Thrombocytopenia (HCC)    Mild protein-calorie malnutrition (HCC)    Hyperbilirubinemia    Injury of left femoral nerve 08/28/2020   History of DVT (deep vein thrombosis) 06/25/2020   Lymphedema 06/25/2020   History of pelvic hematoma 02/10/2020   Foot ulcer, right, limited to breakdown of skin ([place]) 12/02/2019   Chronic venous hypertension due to deep vein thrombosis (DVT) 10/30/2019   Hematoma    Normocytic anemia 08/07/2019   Chronic infection of right knee ([place]) 11/25/2018   Infection and inflammatory reaction due to internal joint prosthesis, subsequent encounter 01/25/2018   Infection of total right knee replacement ([place]) 11/19/2017   Type 2 diabetes mellitus with hyperlipidemia ([place]) 11/06/2016   Primary osteoarthritis of knee 03/11/2016   Primary osteoarthritis of left hip 12/11/2015   Benign essential HTN 04/21/2015   Edema leg 04/21/2015   Chronic kidney disease (CKD), stage III (moderate) ([place]) 04/21/2015   Chronic venous insufficiency 04/21/2015   DDD (degenerative disc disease), cervical 04/21/2015   Diabetes mellitus with renal manifestation ([place]) 04/21/2015   Hyperlipidemia 04/21/2015   Bladder cystocele 04/21/2015   Urinary incontinence 04/21/2015   Leg varices 04/21/2015   Insomnia  04/21/2015   Eczema intertrigo 04/21/2015   Obesity, Class III, BMI 40-49.9 (morbid obesity) ([place]) 04/21/2015   OSA (obstructive sleep apnea) 04/21/2015   Supraventricular premature beats 04/21/2015   Osteoarthritis of multiple joints 04/21/2015   H/O Spinal surgery 11/14/2013   Detrusor muscle hypertonia 11/07/2013    Past Surgical History:  Procedure Laterality Date   ABDOMINAL HYSTERECTOMY  1993   Total   BUNIONECTOMY Bilateral [place] Right 11/19/2017   Procedure: RIGHT KNEE POLY EXCHANGE WITH IRRIGATION AND DEBRIDEMENT;  Surgeon:  Hessie Knows, MD;  Location: [facility];  Service: Orthopedics;  Laterality: Right;   JOINT REPLACEMENT     bilateral knee   LAMINECTOMY  11/14/2013    Cervical Fusion , [facility], Dr. Delilah Shan   LASER ABLATION Bilateral 07/29/2012   Dr. Lucky Cowboy   LOWER EXTREMITY ANGIOGRAPHY Right 11/02/2019   Procedure: Lower Extremity Angiography;  Surgeon: Katha Cabal, MD;  Location: [place] [facility];  Service: Cardiovascular;  Laterality: Right;   ORIF ANKLE FRACTURE Right 09/02/2019   Procedure: OPEN REDUCTION INTERNAL FIXATION (ORIF) ANKLE FRACTURE BIMALLEOLAR;  Surgeon: [name], DPM;  Location: [facility];  Service: Podiatry;  Laterality: Right;   PERIPHERAL VASCULAR THROMBECTOMY Right 03/02/2020   Procedure: PERIPHERAL VASCULAR THROMBECTOMY;  Surgeon: Algernon Huxley, MD;  Location: [place] [facility];  Service: Cardiovascular;  Laterality: Right;   REPLACEMENT TOTAL KNEE Left 03/2010   Dr. Rudene Christians   TOTAL HIP ARTHROPLASTY Left 12/11/2015   Procedure: TOTAL HIP ARTHROPLASTY ANTERIOR APPROACH;  Surgeon: Hessie Knows, MD;  Location: [facility];  Service: Orthopedics;  Laterality: Left;   TOTAL KNEE ARTHROPLASTY Right 03/11/2016   Procedure: TOTAL KNEE ARTHROPLASTY;  Surgeon: Hessie Knows, MD;  Location: [facility];  Service: Orthopedics;  Laterality: Right;    Prior to Admission medications   Medication Sig Start Date End Date Taking? Authorizing Provider  Blood Glucose Monitoring Suppl (ACCU-CHEK GUIDE ME) w/Device KIT 1 Device by Does not apply route once for 1 dose. 09/03/21 09/03/21  [name], MD  acetaminophen (TYLENOL) 650 MG CR tablet Take 650-1,300 mg by mouth 2 (two) times daily as needed for pain.    [provider]  amiodarone (PACERONE) 200 MG tablet Take 200 mg by mouth 2 (two) times daily. 07/21/21   [provider]  apixaban (ELIQUIS) 5 MG TABS tablet Take 1 tablet by mouth 2 (two) times daily. 07/31/21   [provider]  ASPIRIN LOW DOSE 81 MG EC tablet TAKE 1  TABLET BY MOUTH EVERY DAY 08/01/21   Ancil Boozer, Drue Stager, MD  atorvastatin (LIPITOR) 40 MG tablet Take 1 tablet (40 mg total) by mouth every evening. 06/07/21   Ancil Boozer, Drue Stager, MD  benzonatate (TESSALON) 100 MG capsule Take 1 capsule (100 mg total) by mouth 3 (three) times daily as needed. 09/03/21   [name], MD  Calcium Carbonate-Vitamin D 600-400 MG-UNIT tablet Take 1 tablet by mouth 2 (two) times daily. 11/14/20   [name], MD  clonazePAM (KLONOPIN) 0.5 MG tablet Take 1 tablet (0.5 mg total) by mouth 2 (two) times daily as needed for anxiety. Panic attack only 07/23/21   [name], MD  escitalopram (LEXAPRO) 5 MG tablet Take 1 tablet (5 mg total) by mouth daily. For anxiety, please wait until follow up with Dr. Clayborn Bigness to start this medication 07/23/21   [name], MD  fluticasone [facility]) 50 MCG/ACT nasal spray SPRAY 2 SPRAYS INTO EACH NOSTRIL EVERY DAY 05/14/21   Sowles,  Drue Stager, MD  latanoprost (XALATAN) 0.005 % ophthalmic solution SMARTSIG:In Eye(s) 07/12/20   [provider]  levothyroxine (SYNTHROID) 25 MCG tablet TAKE 1 TABLET (25 MCG TOTAL) BY MOUTH DAILY AT 6 (SIX) AM. 05/14/21 07/16/21  [name], MD  Magnesium Oxide 500 MG CAPS Take 500 mg by mouth daily.  11/10/07   [provider]  metoprolol succinate (TOPROL-XL) 50 MG 24 hr tablet Take 50 mg by mouth daily. 09/04/21   [provider]  mirabegron ER (MYRBETRIQ) 50 MG TB24 tablet Take 1 tablet (50 mg total) by mouth daily. 06/28/21   Zara Council A, PA-C  Oxycodone HCl 10 MG TABS Take 10 mg by mouth every 6 (six) hours as needed. 09/06/21   [provider]  polyethylene glycol (MIRALAX / GLYCOLAX) 17 g packet Take 17 g by mouth daily. 09/15/20   Antonieta Pert, MD  potassium chloride (KLOR-CON) 10 MEQ tablet Take 10 mEq by mouth daily. 08/18/19   Callwood, Dwayne D, MD  SIMBRINZA 1-0.2 % SUSP Place 1 drop into both eyes 2 (two) times daily. 10/13/19   [provider]   sodium chloride (OCEAN) 0.65 % SOLN nasal spray Place 1 spray into both nostrils 3 (three) times daily. 07/20/21   Fritzi Mandes, MD  torsemide (DEMADEX) 20 MG tablet TAKE 1 TABLET BY MOUTH EVERY DAY 08/12/21   [name], MD  traZODone (DESYREL) 50 MG tablet TAKE 1/2 TO 1 TABLET BY MOUTH AT BEDTIME AS NEEDED FOR SLEEP. 08/27/21   [name], MD    Allergies Lisinopril  Family History  Problem Relation Age of Onset   Diabetes Mother    Hyperlipidemia Mother    Hypertension Mother    Diabetes Father    Hyperlipidemia Father    Hypertension Father    Obesity Father    Hypertension Sister    Hyperlipidemia Sister    Hyperlipidemia Sister    Breast cancer Neg Hx     Social History Social History   Tobacco Use   Smoking status: Never   Smokeless tobacco: Never  Vaping Use   Vaping Use: Never used  Substance Use Topics   Alcohol use: No    Alcohol/week: 0.0 standard drinks   Drug use: No    Review of Systems Constitutional: Fever Eyes: No visual changes. ENT: No sore throat. Cardiovascular: Denies chest pain. Respiratory: Denies shortness of breath. Gastrointestinal: No abdominal pain.   Genitourinary: Negative for dysuria. Musculoskeletal: Negative for back pain. Skin: Negative for rash. Neurological: Negative for headaches, areas of focal weakness or numbness.    ____________________________________________   PHYSICAL EXAM:  VITAL SIGNS: ED Triage Vitals  Enc Vitals Group     BP 09/14/21 1657 (!) 128/57     Pulse Rate 09/14/21 1657 82     Resp 09/14/21 1657 16     Temp 09/14/21 1657 99.4 F (37.4 C)     Temp Source 09/14/21 1657 Oral     SpO2 09/14/21 1657 95 %     Weight 09/14/21 1654 300 lb (136.1 kg)     Height 09/14/21 1654 _0  (1.803 m)     Head Circumference --      Peak Flow --      Pain Score 09/14/21 1723 2     Pain Loc --      Pain Edu? --      Excl. in [place]? --     Constitutional: Alert and oriented. Well appearing and in no  acute distress. Eyes: Conjunctivae are  normal. Head: Atraumatic. Nose: No congestion/rhinnorhea. Mouth/Throat: Mucous membranes are moist. Neck: No stridor.  Cardiovascular: Normal rate, regular rhythm. Grossly normal heart sounds.  Good peripheral circulation. Respiratory: Normal respiratory effort.  No retractions. Lungs CTAB. Gastrointestinal: Soft and nontender. No distention. Musculoskeletal: No lower extremity tenderness nor edema.  Patient has notable obesity.  She has range of motion of the hip joints bilaterally, but reports moderate pain to the range of motion around the right hip region.  Also has abdominal pain.  No overlying edema erythema or warmth over the hip joint.  Right knee old surgical scar does not show evidence of infection erythema or warmth either.  Strong dorsalis pedis and posterior tibial pulse in the right foot warm and well-perfused. Neurologic:  Normal speech and language. No gross focal neurologic deficits are appreciated.  Skin:  Skin is warm, dry and intact. No rash noted. Psychiatric: Mood and affect are normal. Speech and behavior are normal.  ____________________________________________   LABS (all labs ordered are listed, but only abnormal results are displayed)  Labs Reviewed  COMPREHENSIVE METABOLIC PANEL - Abnormal; Notable for the following components:      Result Value   Glucose, Bld 106 (*)    Creatinine, Ser 1.51 (*)    Total Protein 8.2 (*)    GFR, Estimated 36 (*)    All other components within normal limits  CBC WITH DIFFERENTIAL/PLATELET - Abnormal; Notable for the following components:   RBC 3.80 (*)    RDW 18.0 (*)    Platelets 137 (*)    Lymphs Abs 0.5 (*)    All other components within normal limits  URINALYSIS, ROUTINE W REFLEX MICROSCOPIC - Abnormal; Notable for the following components:   Color, Urine YELLOW (*)    APPearance HAZY (*)    Hgb urine dipstick MODERATE (*)    Protein, ur 100 (*)    Bacteria, UA RARE (*)    All  other components within normal limits  TROPONIN I (HIGH SENSITIVITY) - Abnormal; Notable for the following components:   Troponin I (High Sensitivity) 20 (*)    All other components within normal limits  RESP PANEL BY RT-PCR (FLU A&B, COVID) ARPGX2  CULTURE, BLOOD (ROUTINE X 2)  CULTURE, BLOOD (ROUTINE X 2)  LACTIC ACID, PLASMA  LACTIC ACID, PLASMA  PROCALCITONIN  SEDIMENTATION RATE  TROPONIN I (HIGH SENSITIVITY)   ____________________________________________  EKG  ED ECG REPORT I, Delman Kitten, the attending physician, personally viewed and interpreted this ECG.  Date: 09/14/2021 EKG Time: 1710 Rate: 80 Rhythm: normal sinus rhythm QRS Axis: normal Intervals: normal ST/T Wave abnormalities: normal Narrative Interpretation: no evidence of acute ischemia  ____________________________________________  RADIOLOGY  DG Chest 2 View  Result Date: 09/14/2021 CLINICAL DATA:  Fever. EXAM: CHEST - 2 VIEW COMPARISON:  July 15, 2021 FINDINGS: Decreased lung volumes are seen with mild areas of atelectasis and/or infiltrate noted within the bilateral lung bases. There is no evidence of a pleural effusion or pneumothorax. The heart size and mediastinal contours are within normal limits. Chronic deformities are seen involving the bilateral shoulders. Degenerative changes are seen within the visualized portion of the cervical spine. Multilevel degenerative changes are noted throughout the thoracic spine. IMPRESSION: Mild bibasilar atelectasis and/or infiltrate. Electronically Signed   By: Virgina Norfolk M.D.   On: 09/14/2021 19:39   CT CHEST WO CONTRAST  Result Date: 09/14/2021 CLINICAL DATA:  Fever of unknown origin. EXAM: CT CHEST WITHOUT CONTRAST TECHNIQUE: Multidetector CT imaging of the chest was performed  following the standard protocol without IV contrast. COMPARISON:  February 23, 2021 FINDINGS: Cardiovascular: There is mild calcification of the aortic arch, without evidence of aortic  aneurysm. Normal heart size with moderate severity coronary artery calcification. No pericardial effusion. Mediastinum/Nodes: A 2.3 cm x 1.7 cm axillary lymph node is suspected (axial CT image 21, CT series 2). A stable, similar appearing 3.0 cm x 2.4 cm low-attenuation soft tissue mass is seen in medial to the left glenoid (axial CT image 1, CT series 2). Thyroid gland, trachea, and esophagus demonstrate no significant findings. Lungs/Pleura: Mild atelectasis is seen within the posterior aspects of the bilateral lower lobes. There is no evidence of a pleural effusion or pneumothorax. Upper Abdomen: No acute abnormality. Musculoskeletal: Marked severity degenerative changes are seen involving the right shoulder, with a large chronic appearing deformity seen involving the left humeral head and left glenoid. Multilevel degenerative changes seen throughout the thoracic spine. IMPRESSION: 1. Mild bilateral lower lobe atelectasis. 2. Moderate severity coronary artery calcification. 3. Marked severity degenerative changes involving the right shoulder, with a large chronic appearing deformity involving the left humeral head and left glenoid. 4. Aortic atherosclerosis. Aortic Atherosclerosis (ICD10-I70.0). Electronically Signed   By: Virgina Norfolk M.D.   On: 09/14/2021 22:33   CT Hip Right Wo Contrast  Result Date: 09/14/2021 CLINICAL DATA:  Right hip pain and fever. EXAM: CT OF THE RIGHT HIP WITHOUT CONTRAST TECHNIQUE: Multidetector CT imaging of the right hip was performed according to the standard protocol. Multiplanar CT image reconstructions were also generated. COMPARISON:  None. FINDINGS: Bones/Joint/Cartilage There is no evidence of acute fracture or dislocation. 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 Marked severity chronic and degenerative changes are seen involving the right hip. This is in the form of joint space narrowing, acetabular sclerosis and subchondral cyst  formation. Mild flattening of the right humeral head is also seen. Ligaments Suboptimally assessed by CT. Muscles and Tendons Unremarkable Soft tissues A right hip effusion is noted. IMPRESSION: 1. No evidence of an acute fracture or dislocation. 2. Marked severity chronic and degenerative changes involving the right hip. 3. Right hip effusion. Electronically Signed   By: Virgina Norfolk M.D.   On: 09/14/2021 22:40    ____________________________________________   PROCEDURES  Procedure(s) performed: None  Procedures  Critical Care performed: No  ____________________________________________   INITIAL IMPRESSION / ASSESSMENT AND PLAN / ED COURSE  Pertinent labs & imaging results that were available during my care of the patient were reviewed by me and considered in my medical decision making (see chart for details).   Fever.  Has had previous concerns of sepsis due to and notable right hip joint effusion.  States she does report increased right hip pain and fever.  Imaging of the chest and also slightly low oxygen saturation at triage suspicious for possible pneumonia though, but also her right hip pain exacerbation is concerning for potential acute infectious etiology involving the right hip joint.  Her clinical exam seems to be less suggestive of an acute septic joint however I suspect this may represent pneumonia.  I have ordered CT imaging of the chest as well as right hip to further evaluate.  She has mild acute on chronic kidney disease and I will withhold iodinated contrast at this time  No other clear source to noted.  Blood cultures ordered.  Denies any chest pain.  She is alert oriented to baseline.  No headache.  No neck pain or stiffness.  No other  etiology or source identified by clinical exam  Discussed with hospitalist.  Will admit for further care and management especially in the setting of history of multiple polyarticular infections now presenting with fever without an  obvious source except pain and joint effusion right hip.        ____________________________________________   FINAL CLINICAL IMPRESSION(S) / ED DIAGNOSES  Final diagnoses:  None  Fever, Right hip joint effusion,      Note:  This document was prepared using Dragon voice recognition software and may include unintentional dictation errors       Delman Kitten, MD 09/15/21 0154

## 2021-09-14 NOTE — ED Triage Notes (Signed)
Pt states she has had a fever since Tuesday, and has been weak in her legs.

## 2021-09-14 NOTE — H&P (Addendum)
History and Physical    Becky Trevino DVV:[id number] DOB: October 22, 1945 DOA: 09/14/2021  PCP: [name], MD  Patient coming from: Home   Chief Complaint:  Chief Complaint  Patient presents with   Fever     HPI:    75 year old female with past medical history of paroxysmal atrial fibrillation (on Eliquis), hyperlipidemia, hypertension, history of SVT, pulmonary hypertension, chronic kidney disease stage IIIB, non-insulin-dependent diabetes mellitus type 2, obesity, obstructive sleep apnea, chronic DVT of the right lower extremity status post IVC filter placement, lymphedema, hypothyroidism who presents to [facility] emergency department with complaints of fevers and weakness.  Of note, patient has a complicated infection history that is described in extensive detail and Dr. Delaine Lame with ID's consultation note on 11/1.  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  Most recently, patient was hospitalized for sepsis with septic shock from 11/1 until 11/5.  After an extensive work-up no exact source could be identified and patient was sent home on 11/5.   Patient now reports that approximately 3 days ago she began to develop recurrent fevers.  Patient has also developed associated increasing generalized weakness and malaise.  Patient also reports having increasing pain in her right hip.  Patient describes his pain as sharp in quality, radiating distally, worse with weightbearing or movement of the affected extremity.  Patient symptoms of recurrent fevers and generalized weakness continue to persist until she eventually presented to [facility] emergency department for evaluation.  Upon evaluation in the emergency department initial work-up has essentially been unremarkable with a normal white blood cell count normal lactic acid of 1.6 and normal ESR of 2.  Patient however has  been found to have a markedly elevated procalcitonin of 5.38.  Chest x-ray and CT chest revealed no definitive evidence of pneumonia.  CT imaging of the right hip was performed due to complaints of right hip pain revealing a right hip effusion but no evidence of acute fracture.  Due to complaints of persisting fevers and weakness considering patient's extensive infection history the hospitalist group has been called to assess the patient for admission to the hospital.  Review of Systems:   Review of Systems  Constitutional:  Positive for fever and malaise/fatigue.  Neurological:  Positive for focal weakness.   Past Medical History:  Diagnosis Date   Cervical spondylosis    Chronic kidney disease    Stage 3   DDD (degenerative disc disease), cervical    [facility]   Diabetes mellitus without complication (HCC)    Diverticulosis    Hyperlipidemia    Hypertension    Intertrigo    Leg cramps    Leg varices    Obesity    OSA (obstructive sleep apnea)    Symptomatic menopausal or female climacteric states    Syncope     Past Surgical History:  Procedure Laterality Date   ABDOMINAL HYSTERECTOMY  1993   Total   BUNIONECTOMY Bilateral 1993   I & D KNEE WITH POLY EXCHANGE Right 11/19/2017   Procedure: RIGHT KNEE POLY EXCHANGE WITH IRRIGATION AND DEBRIDEMENT;  Surgeon: Hessie Knows, MD;  Location: [facility] ORS;  Service: Orthopedics;  Laterality: Right;   JOINT REPLACEMENT     bilateral knee   LAMINECTOMY  11/14/2013    Cervical Fusion , [facility], Dr. Delilah Shan   LASER ABLATION Bilateral 07/29/2012   Dr. Lucky Cowboy   LOWER EXTREMITY ANGIOGRAPHY Right 11/02/2019  Procedure: Lower Extremity Angiography;  Surgeon: Katha Cabal, MD;  Location: [place] [facility];  Service: Cardiovascular;  Laterality: Right;   ORIF ANKLE FRACTURE Right 09/02/2019   Procedure: OPEN REDUCTION INTERNAL FIXATION (ORIF) ANKLE FRACTURE BIMALLEOLAR;  Surgeon: [name], DPM;  Location: [facility] ORS;  Service:  Podiatry;  Laterality: Right;   PERIPHERAL VASCULAR THROMBECTOMY Right 03/02/2020   Procedure: PERIPHERAL VASCULAR THROMBECTOMY;  Surgeon: Algernon Huxley, MD;  Location: [place] [facility];  Service: Cardiovascular;  Laterality: Right;   REPLACEMENT TOTAL KNEE Left 03/2010   Dr. Rudene Christians   TOTAL HIP ARTHROPLASTY Left 12/11/2015   Procedure: TOTAL HIP ARTHROPLASTY ANTERIOR APPROACH;  Surgeon: Hessie Knows, MD;  Location: [facility] OR[facility];  Service: Orthopedics;  Laterality: Left;   TOTAL KNEE ARTHROPLASTY Right 03/11/2016   Procedure: TOTAL KNEE ARTHROPLASTY;  Surgeon: Hessie Knows, MD;  Location: [facility] OR[facility];  Service: Orthopedics;  Laterality: Right;     reports that she has never smoked. She has never used smokeless tobacco. She reports that she does not drink alcohol and does not use drugs.  Allergies  Allergen Reactions   Lisinopril Swelling    Family History  Problem Relation Age of Onset   Diabetes Mother    Hyperlipidemia Mother    Hypertension Mother    Diabetes Father    Hyperlipidemia Father    Hypertension Father    Obesity Father    Hypertension Sister    Hyperlipidemia Sister    Hyperlipidemia Sister    Breast cancer Neg Hx      Prior to Admission medications   Medication Sig Start Date End Date Taking? Authorizing Provider  amiodarone (PACERONE) 200 MG tablet Take 200 mg by mouth 2 (two) times daily. 07/21/21  Yes [provider]  apixaban (ELIQUIS) 5 MG TABS tablet Take 1 tablet by mouth 2 (two) times daily. 07/31/21  Yes [provider]  ASPIRIN LOW DOSE 81 MG EC tablet TAKE 1 TABLET BY MOUTH EVERY DAY 08/01/21  Yes Sowles, Drue Stager, MD  atorvastatin (LIPITOR) 40 MG tablet Take 1 tablet (40 mg total) by mouth every evening. 06/07/21  Yes Sowles, Drue Stager, MD  benzonatate (TESSALON) 100 MG capsule Take 1 capsule (100 mg total) by mouth 3 (three) times daily as needed. 09/03/21  Yes Sowles, Drue Stager, MD  Blood Glucose Monitoring Suppl (ACCU-CHEK GUIDE ME) w/Device KIT  1 Device by Does not apply route once for 1 dose. 09/03/21 09/03/21  [name], MD  Calcium Carbonate-Vitamin D 600-400 MG-UNIT tablet Take 1 tablet by mouth 2 (two) times daily. 11/14/20  Yes Sowles, Drue Stager, MD  clonazePAM (KLONOPIN) 0.5 MG tablet Take 1 tablet (0.5 mg total) by mouth 2 (two) times daily as needed for anxiety. Panic attack only 07/23/21  Yes Sowles, Drue Stager, MD  escitalopram (LEXAPRO) 5 MG tablet Take 1 tablet (5 mg total) by mouth daily. For anxiety, please wait until follow up with Dr. Clayborn Bigness to start this medication 07/23/21  Yes Sowles, Drue Stager, MD  fluticasone [facility]) 50 MCG/ACT nasal spray SPRAY 2 SPRAYS INTO EACH NOSTRIL EVERY DAY 05/14/21  Yes Sowles, Drue Stager, MD  latanoprost (XALATAN) 0.005 % ophthalmic solution SMARTSIG:In Eye(s) 07/12/20  Yes [provider]  levothyroxine (SYNTHROID) 25 MCG tablet TAKE 1 TABLET (25 MCG TOTAL) BY MOUTH DAILY AT 6 (SIX) AM. 05/14/21 09/14/21 Yes Sowles, Drue Stager, MD  Magnesium Oxide 500 MG CAPS Take 500 mg by mouth daily.  11/10/07  Yes [provider]  metoprolol succinate (TOPROL-XL) 50 MG 24 hr tablet Take 50  mg by mouth daily. 09/04/21  Yes [provider]  mirabegron ER (MYRBETRIQ) 50 MG TB24 tablet Take 1 tablet (50 mg total) by mouth daily. 06/28/21  Yes [name], Larene Beach A, PA-C  Oxycodone HCl 10 MG TABS Take 10 mg by mouth every 6 (six) hours as needed. 09/06/21  Yes [provider]  polyethylene glycol (MIRALAX / GLYCOLAX) 17 g packet Take 17 g by mouth daily. 09/15/20  Yes Antonieta Pert, MD  potassium chloride (KLOR-CON) 10 MEQ tablet Take 10 mEq by mouth daily. 08/18/19  Yes Callwood, Dwayne D, MD  SIMBRINZA 1-0.2 % SUSP Place 1 drop into both eyes 2 (two) times daily. 10/13/19  Yes [provider]  sodium chloride (OCEAN) 0.65 % SOLN nasal spray Place 1 spray into both nostrils 3 (three) times daily. 07/20/21  Yes Fritzi Mandes, MD  torsemide (DEMADEX) 20 MG tablet TAKE 1 TABLET BY MOUTH  EVERY DAY 08/12/21  Yes Sowles, Drue Stager, MD  traZODone (DESYREL) 50 MG tablet TAKE 1/2 TO 1 TABLET BY MOUTH AT BEDTIME AS NEEDED FOR SLEEP. 08/27/21  Yes Sowles, Drue Stager, MD  acetaminophen (TYLENOL) 650 MG CR tablet Take 650-1,300 mg by mouth 2 (two) times daily as needed for pain.    [provider]    Physical Exam: Vitals:   09/14/21 2315 09/14/21 2330 09/15/21 0000 09/15/21 0030  BP:  106/61 103/62 106/61  Pulse: 80 79 78 76  Resp: 14 14 15 17   Temp:      TempSrc:      SpO2: 97% 97% 97% 98%  Weight:      Height:        Constitutional: Lethargic but arousable, oriented x3, no associated distress.   Skin: Notable splotchy redness of the entirety of the left lower extremity with mild induration.  Somewhat poor skin turgor noted. Eyes: Pupils are equally reactive to light.  No evidence of scleral icterus or conjunctival pallor.  ENMT: Slightly dry mucous membranes noted.  Posterior pharynx clear of any exudate or lesions.   Neck: normal, supple, no masses, no thyromegaly.  No evidence of jugular venous distension.   Respiratory: clear to auscultation bilaterally, no wheezing, no crackles. Normal respiratory effort. No accessory muscle use.  Cardiovascular: Regular rate and rhythm, no murmurs / rubs / gallops. No extremity edema. 2+ pedal pulses. No carotid bruits.  Chest:   Nontender without crepitus or deformity.   Back:   Nontender without crepitus or deformity. Abdomen: Abdomen is soft and nontender.  No evidence of intra-abdominal masses.  Positive bowel sounds noted in all quadrants.   Musculoskeletal: Notable redness of the left lower extremity as noted above.  Some pain with palpation of the entirety of the left lower extremity.  Severe pain with passive and active range of motion of the right hip.  No pain with passive and active range of motion of the left hip.   no contractures. Normal muscle tone.  Neurologic: CN 2-12 grossly intact. Sensation intact.  Patient moving  all 4 extremities spontaneously.  Patient is following all commands.  Patient is responsive to verbal stimuli.   Psychiatric: Patient exhibits normal mood with flat affect.  Patient seems to possess insight as to their current situation.            Labs on Admission: I have personally reviewed following labs and imaging studies -   CBC: Recent Labs  Lab 09/14/21 1657  WBC 8.5  NEUTROABS 7.6  HGB 12.0  HCT 37.3  MCV 98.2  PLT 644*   Basic Metabolic Panel: Recent Labs  Lab 09/14/21 1657  NA 135  K 3.8  CL 99  CO2 26  GLUCOSE 106*  BUN 23  CREATININE 1.51*  CALCIUM 9.1   GFR: Estimated Creatinine Clearance: 49.2 mL/min (A) (by C-G formula based on SCr of 1.51 mg/dL (H)). Liver Function Tests: Recent Labs  Lab 09/14/21 1657  AST 29  ALT 20  ALKPHOS 66  BILITOT 1.1  PROT 8.2*  ALBUMIN 3.7   No results for input(s): LIPASE, AMYLASE in the last 168 hours. No results for input(s): AMMONIA in the last 168 hours. Coagulation Profile: No results for input(s): INR, PROTIME in the last 168 hours. Cardiac Enzymes: No results for input(s): CKTOTAL, CKMB, CKMBINDEX, TROPONINI in the last 168 hours. BNP (last 3 results) No results for input(s): PROBNP in the last 8760 hours. HbA1C: No results for input(s): HGBA1C in the last 72 hours. CBG: No results for input(s): GLUCAP in the last 168 hours. Lipid Profile: No results for input(s): CHOL, HDL, LDLCALC, TRIG, CHOLHDL, LDLDIRECT in the last 72 hours. Thyroid Function Tests: No results for input(s): TSH, T4TOTAL, FREET4, T3FREE, THYROIDAB in the last 72 hours. Anemia Panel: No results for input(s): VITAMINB12, FOLATE, FERRITIN, TIBC, IRON, RETICCTPCT in the last 72 hours. Urine analysis:    Component Value Date/Time   COLORURINE YELLOW (A) 09/14/2021 2040   APPEARANCEUR HAZY (A) 09/14/2021 2040   APPEARANCEUR Clear 06/28/2021 1015   LABSPEC 1.024 09/14/2021 2040   PHURINE 6.0 09/14/2021 2040   GLUCOSEU NEGATIVE  09/14/2021 2040   HGBUR MODERATE (A) 09/14/2021 2040   BILIRUBINUR NEGATIVE 09/14/2021 2040   BILIRUBINUR Negative 06/28/2021 [place] 09/14/2021 2040   PROTEINUR 100 (A) 09/14/2021 2040   NITRITE NEGATIVE 09/14/2021 2040   LEUKOCYTESUR NEGATIVE 09/14/2021 2040    Radiological Exams on Admission - Personally Reviewed: DG Chest 2 View  Result Date: 09/14/2021 CLINICAL DATA:  Fever. EXAM: CHEST - 2 VIEW COMPARISON:  July 15, 2021 FINDINGS: Decreased lung volumes are seen with mild areas of atelectasis and/or infiltrate noted within the bilateral lung bases. There is no evidence of a pleural effusion or pneumothorax. The heart size and mediastinal contours are within normal limits. Chronic deformities are seen involving the bilateral shoulders. Degenerative changes are seen within the visualized portion of the cervical spine. Multilevel degenerative changes are noted throughout the thoracic spine. IMPRESSION: Mild bibasilar atelectasis and/or infiltrate. Electronically Signed   By: Virgina Norfolk M.D.   On: 09/14/2021 19:39   CT CHEST WO CONTRAST  Result Date: 09/14/2021 CLINICAL DATA:  Fever of unknown origin. EXAM: CT CHEST WITHOUT CONTRAST TECHNIQUE: Multidetector CT imaging of the chest was performed following the standard protocol without IV contrast. COMPARISON:  February 23, 2021 FINDINGS: Cardiovascular: There is mild calcification of the aortic arch, without evidence of aortic aneurysm. Normal heart size with moderate severity coronary artery calcification. No pericardial effusion. Mediastinum/Nodes: A 2.3 cm x 1.7 cm axillary lymph node is suspected (axial CT image 21, CT series 2). A stable, similar appearing 3.0 cm x 2.4 cm low-attenuation soft tissue mass is seen in medial to the left glenoid (axial CT image 1, CT series 2). Thyroid gland, trachea, and esophagus demonstrate no significant findings. Lungs/Pleura: Mild atelectasis is seen within the posterior aspects of  the bilateral lower lobes. There is no evidence of a pleural effusion or pneumothorax. Upper Abdomen: No acute abnormality. Musculoskeletal: Marked severity degenerative changes are seen involving the right shoulder, with  a large chronic appearing deformity seen involving the left humeral head and left glenoid. Multilevel degenerative changes seen throughout the thoracic spine. IMPRESSION: 1. Mild bilateral lower lobe atelectasis. 2. Moderate severity coronary artery calcification. 3. Marked severity degenerative changes involving the right shoulder, with a large chronic appearing deformity involving the left humeral head and left glenoid. 4. Aortic atherosclerosis. Aortic Atherosclerosis (ICD10-I70.0). Electronically Signed   By: Virgina Norfolk M.D.   On: 09/14/2021 22:33   CT Hip Right Wo Contrast  Result Date: 09/14/2021 CLINICAL DATA:  Right hip pain and fever. EXAM: CT OF THE RIGHT HIP WITHOUT CONTRAST TECHNIQUE: Multidetector CT imaging of the right hip was performed according to the standard protocol. Multiplanar CT image reconstructions were also generated. COMPARISON:  None. FINDINGS: Bones/Joint/Cartilage There is no evidence of acute fracture or dislocation. 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 Marked severity chronic and degenerative changes are seen involving the right hip. This is in the form of joint space narrowing, acetabular sclerosis and subchondral cyst formation. Mild flattening of the right humeral head is also seen. Ligaments Suboptimally assessed by CT. Muscles and Tendons Unremarkable Soft tissues A right hip effusion is noted. IMPRESSION: 1. No evidence of an acute fracture or dislocation. 2. Marked severity chronic and degenerative changes involving the right hip. 3. Right hip effusion. Electronically Signed   By: Virgina Norfolk M.D.   On: 09/14/2021 22:40    EKG: Personally reviewed.  Rhythm is normal sinus rhythm with heart rate of 83  bpm.  No dynamic ST segment changes appreciated.  Assessment/Plan  * Cellulitis of left lower extremity- (present on admission) Patient presenting with 4-day history of increasing malaise, low-grade fevers and increasing weakness with difficulty with ambulation Upon further questioning patient has also been developing diffuse splotchy redness and warmth of the left lower extremity concerning for cellulitis as a potential source of infection Extensive work-up negative for other potential sources of infection. While patient does not exhibit a leukocytosis while here, procalcitonin is markedly elevated at 5.38 concerning for ongoing bacterial infection. Considering patient's complicated history of severe recurrent bacterial infections, particularly histories of both strep and MRSA bacteremia, will aggressively treat for developing cellulitis While patient does complain of right hip pain and does have fluid in the right hip on CT imaging -this was present during the hospitalization in November with arthrocentesis revealing no evidence of infection at that time.  I believe this is mostly due to arthritis and do not clinically think the patient has an infection in this hip. Placing patient on intravenous ceftriaxone Gentle intravenous hydration with isotonic fluids Obtaining blood cultures   Acute renal failure superimposed on stage 3b chronic kidney disease ([place])- (present on admission) Patient exhibiting evidence of acute kidney injury secondary to volume depletion Creatinine is currently 1.51 an increase compared to baseline of 1.08 suggestive of early mild renal injury Hydrating patient gently with intravenous isotonic fluids, holding home regimen of torsemide Strict input and output monitoring Monitoring renal function and electrolytes with serial chemistries Avoiding nephrotoxic agents if at all possible    Elevated troponin level not due myocardial infarction- (present on  admission) Minimal elevation of troponin in the setting of mild renal injury Patient is chest pain-free Monitoring patient on telemetry No evidence of dynamic ST segment changes.   AF (paroxysmal atrial fibrillation) ([place])- (present on admission) Currently rate controlled. Continue home regimen of anticoagulation with Eliquis Continue home regimen of rate controlling agent with amiodarone and metoprolol Monitoring on  telemetry   Type 2 diabetes mellitus with stage 3b chronic kidney disease, without long-term current use of insulin ([place])- (present on admission) Patient been placed on Accu-Cheks before every meal and nightly with sliding scale insulin Holding home regimen of hypoglycemics Hemoglobin A1C ordered Diabetic Diet   Essential hypertension Resume patients home regimen of oral antihypertensives Titrate antihypertensive regimen as necessary to achieve adequate BP control PRN intravenous antihypertensives for excessively elevated blood pressure    Hypothyroidism- (present on admission) Resume home regimen of Synthroid        Code Status:  Full code  code status decision has been confirmed with: patient Family Communication: deferred  Status is: Observation  The patient remains OBS appropriate and will d/c before 2 midnights.       Vernelle Emerald MD [facility] Pager [phone]  If 7PM-7AM, please contact night-coverage [email or web address] Use universal [place] password for that web site. If you do not have the password, please call the hospital operator.  09/15/2021, 5:05 AM

## 2021-09-14 NOTE — ED Provider Notes (Addendum)
Emergency Medicine Provider Triage Evaluation Note  Becky Trevino , a 75 y.o. female  was evaluated in triage.  Pt complains of weakness, fever.  Review of Systems  Positive: Weakness, fever Negative: Chest pain, shortness of breath, dysuria  Physical Exam  BP (!) 128/57 (BP Location: Left Arm)    Pulse 82    Temp 99.4 F (37.4 C) (Oral)    Resp 16    Ht 5\' 11"  (1.803 m)    Wt 136.1 kg    SpO2 95%    BMI 41.84 kg/m  Gen:   Awake, no distress   Resp:  Normal effort  MSK:   Moves extremities without difficulty  Other:    Medical Decision Making  Medically screening exam initiated at 5:02 PM.  Appropriate orders placed.  JERYL WILBOURN was informed that the remainder of the evaluation will be completed by another provider, this initial triage assessment does not replace that evaluation, and the importance of remaining in the ED until their evaluation is complete.   Patient does not wear O2 at home.  Oxygen is dropping into the low 90s.  Will place on O2 here in the ED Versie Starks, PA-C 09/14/21 1703    Versie Starks, PA-C 09/14/21 1712    Naaman Plummer, MD 09/20/21 [phone]

## 2021-09-15 ENCOUNTER — Encounter: Payer: Self-pay | Admitting: Internal Medicine

## 2021-09-15 DIAGNOSIS — B955 Unspecified streptococcus as the cause of diseases classified elsewhere: Secondary | ICD-10-CM | POA: Diagnosis not present

## 2021-09-15 DIAGNOSIS — I480 Paroxysmal atrial fibrillation: Secondary | ICD-10-CM | POA: Diagnosis present

## 2021-09-15 DIAGNOSIS — E785 Hyperlipidemia, unspecified: Secondary | ICD-10-CM | POA: Diagnosis present

## 2021-09-15 DIAGNOSIS — E1122 Type 2 diabetes mellitus with diabetic chronic kidney disease: Secondary | ICD-10-CM | POA: Diagnosis present

## 2021-09-15 DIAGNOSIS — E039 Hypothyroidism, unspecified: Secondary | ICD-10-CM | POA: Diagnosis present

## 2021-09-15 DIAGNOSIS — Z6841 Body Mass Index (BMI) 40.0 and over, adult: Secondary | ICD-10-CM | POA: Diagnosis not present

## 2021-09-15 DIAGNOSIS — L03116 Cellulitis of left lower limb: Secondary | ICD-10-CM | POA: Diagnosis present

## 2021-09-15 DIAGNOSIS — E860 Dehydration: Secondary | ICD-10-CM | POA: Diagnosis present

## 2021-09-15 DIAGNOSIS — D631 Anemia in chronic kidney disease: Secondary | ICD-10-CM | POA: Diagnosis present

## 2021-09-15 DIAGNOSIS — E869 Volume depletion, unspecified: Secondary | ICD-10-CM | POA: Diagnosis present

## 2021-09-15 DIAGNOSIS — E871 Hypo-osmolality and hyponatremia: Secondary | ICD-10-CM | POA: Diagnosis not present

## 2021-09-15 DIAGNOSIS — J9811 Atelectasis: Secondary | ICD-10-CM | POA: Diagnosis not present

## 2021-09-15 DIAGNOSIS — A401 Sepsis due to streptococcus, group B: Secondary | ICD-10-CM | POA: Diagnosis present

## 2021-09-15 DIAGNOSIS — R7989 Other specified abnormal findings of blood chemistry: Secondary | ICD-10-CM | POA: Diagnosis present

## 2021-09-15 DIAGNOSIS — Z20822 Contact with and (suspected) exposure to covid-19: Secondary | ICD-10-CM | POA: Diagnosis present

## 2021-09-15 DIAGNOSIS — B951 Streptococcus, group B, as the cause of diseases classified elsewhere: Secondary | ICD-10-CM | POA: Diagnosis not present

## 2021-09-15 DIAGNOSIS — I272 Pulmonary hypertension, unspecified: Secondary | ICD-10-CM | POA: Diagnosis present

## 2021-09-15 DIAGNOSIS — G9341 Metabolic encephalopathy: Secondary | ICD-10-CM | POA: Diagnosis present

## 2021-09-15 DIAGNOSIS — N179 Acute kidney failure, unspecified: Secondary | ICD-10-CM | POA: Diagnosis present

## 2021-09-15 DIAGNOSIS — R778 Other specified abnormalities of plasma proteins: Secondary | ICD-10-CM | POA: Diagnosis present

## 2021-09-15 DIAGNOSIS — R509 Fever, unspecified: Secondary | ICD-10-CM | POA: Diagnosis present

## 2021-09-15 DIAGNOSIS — F32A Depression, unspecified: Secondary | ICD-10-CM | POA: Diagnosis present

## 2021-09-15 DIAGNOSIS — I10 Essential (primary) hypertension: Secondary | ICD-10-CM | POA: Diagnosis not present

## 2021-09-15 DIAGNOSIS — R7881 Bacteremia: Secondary | ICD-10-CM | POA: Diagnosis not present

## 2021-09-15 DIAGNOSIS — I129 Hypertensive chronic kidney disease with stage 1 through stage 4 chronic kidney disease, or unspecified chronic kidney disease: Secondary | ICD-10-CM | POA: Diagnosis present

## 2021-09-15 DIAGNOSIS — N1832 Chronic kidney disease, stage 3b: Secondary | ICD-10-CM | POA: Diagnosis present

## 2021-09-15 DIAGNOSIS — G4733 Obstructive sleep apnea (adult) (pediatric): Secondary | ICD-10-CM | POA: Diagnosis present

## 2021-09-15 DIAGNOSIS — M00051 Staphylococcal arthritis, right hip: Secondary | ICD-10-CM | POA: Diagnosis present

## 2021-09-15 DIAGNOSIS — Z95828 Presence of other vascular implants and grafts: Secondary | ICD-10-CM | POA: Diagnosis not present

## 2021-09-15 DIAGNOSIS — E1169 Type 2 diabetes mellitus with other specified complication: Secondary | ICD-10-CM | POA: Diagnosis present

## 2021-09-15 LAB — COMPREHENSIVE METABOLIC PANEL
ALT: 18 U/L (ref 0–44)
AST: 21 U/L (ref 15–41)
Albumin: 3.2 g/dL — ABNORMAL LOW (ref 3.5–5.0)
Alkaline Phosphatase: 60 U/L (ref 38–126)
Anion gap: 9 (ref 5–15)
BUN: 22 mg/dL (ref 8–23)
CO2: 22 mmol/L (ref 22–32)
Calcium: 8.8 mg/dL — ABNORMAL LOW (ref 8.9–10.3)
Chloride: 101 mmol/L (ref 98–111)
Creatinine, Ser: 1.31 mg/dL — ABNORMAL HIGH (ref 0.44–1.00)
GFR, Estimated: 42 mL/min — ABNORMAL LOW (ref >60)
Glucose, Bld: 100 mg/dL — ABNORMAL HIGH (ref 70–99)
Potassium: 3.7 mmol/L (ref 3.5–5.1)
Sodium: 132 mmol/L — ABNORMAL LOW (ref 135–145)
Total Bilirubin: 1.1 mg/dL (ref 0.3–1.2)
Total Protein: 7.5 g/dL (ref 6.5–8.1)

## 2021-09-15 LAB — CBC WITH DIFFERENTIAL/PLATELET
Abs Immature Granulocytes: 0.04 10*3/uL (ref 0.00–0.07)
Basophils Absolute: 0 10*3/uL (ref 0.0–0.1)
Basophils Relative: 0 %
Eosinophils Absolute: 0 10*3/uL (ref 0.0–0.5)
Eosinophils Relative: 0 %
HCT: 34.6 % — ABNORMAL LOW (ref 36.0–46.0)
Hemoglobin: 11.3 g/dL — ABNORMAL LOW (ref 12.0–15.0)
Immature Granulocytes: 1 %
Lymphocytes Relative: 6 %
Lymphs Abs: 0.6 10*3/uL — ABNORMAL LOW (ref 0.7–4.0)
MCH: 31.4 pg (ref 26.0–34.0)
MCHC: 32.7 g/dL (ref 30.0–36.0)
MCV: 96.1 fL (ref 80.0–100.0)
Monocytes Absolute: 0.7 10*3/uL (ref 0.1–1.0)
Monocytes Relative: 8 %
Neutro Abs: 7.3 10*3/uL (ref 1.7–7.7)
Neutrophils Relative %: 85 %
Platelets: 126 10*3/uL — ABNORMAL LOW (ref 150–400)
RBC: 3.6 MIL/uL — ABNORMAL LOW (ref 3.87–5.11)
RDW: 17.9 % — ABNORMAL HIGH (ref 11.5–15.5)
WBC: 8.6 10*3/uL (ref 4.0–10.5)
nRBC: 0 % (ref 0.0–0.2)

## 2021-09-15 LAB — LACTIC ACID, PLASMA: Lactic Acid, Venous: 1.3 mmol/L (ref 0.5–1.9)

## 2021-09-15 LAB — GLUCOSE, CAPILLARY
Glucose-Capillary: 96 mg/dL (ref 70–99)
Glucose-Capillary: 99 mg/dL (ref 70–99)
Glucose-Capillary: 96 mg/dL (ref 70–99)
Glucose-Capillary: 114 mg/dL — ABNORMAL HIGH (ref 70–99)

## 2021-09-15 LAB — RESP PANEL BY RT-PCR (FLU A&B, COVID) ARPGX2
Influenza A by PCR: NEGATIVE
Influenza B by PCR: NEGATIVE
SARS Coronavirus 2 by RT PCR: NEGATIVE

## 2021-09-15 LAB — MAGNESIUM: Magnesium: 2.1 mg/dL (ref 1.7–2.4)

## 2021-09-15 LAB — C-REACTIVE PROTEIN: CRP: 36.1 mg/dL — ABNORMAL HIGH (ref <1.0)

## 2021-09-15 MED ORDER — LEVOTHYROXINE SODIUM 25 MCG PO TABS
25.0000 ug | ORAL_TABLET | Freq: Every day | ORAL | Status: DC
  Administered 2021-09-15 – 2021-09-24 (×9): 25 ug via ORAL
  Filled 2021-09-15 (×9): qty 1

## 2021-09-15 MED ORDER — ASPIRIN EC 81 MG PO TBEC
81.0000 mg | DELAYED_RELEASE_TABLET | Freq: Every day | ORAL | Status: DC
  Administered 2021-09-15 – 2021-09-22 (×7): 81 mg via ORAL
  Filled 2021-09-15 (×8): qty 1

## 2021-09-15 MED ORDER — BRIMONIDINE TARTRATE 0.2 % OP SOLN
1.0000 [drp] | Freq: Three times a day (TID) | OPHTHALMIC | Status: DC
  Administered 2021-09-15 – 2021-09-24 (×25): 1 [drp] via OPHTHALMIC
  Filled 2021-09-15: qty 5

## 2021-09-15 MED ORDER — LATANOPROST 0.005 % OP SOLN
1.0000 [drp] | Freq: Every day | OPHTHALMIC | Status: DC
  Administered 2021-09-15 – 2021-09-23 (×9): 1 [drp] via OPHTHALMIC
  Filled 2021-09-15: qty 2.5

## 2021-09-15 MED ORDER — ACETAMINOPHEN 325 MG PO TABS
650.0000 mg | ORAL_TABLET | Freq: Four times a day (QID) | ORAL | Status: DC | PRN
  Administered 2021-09-15 – 2021-09-24 (×5): 650 mg via ORAL
  Filled 2021-09-15 (×5): qty 2

## 2021-09-15 MED ORDER — HYDRALAZINE HCL 20 MG/ML IJ SOLN: 10.0000 mg | Freq: Four times a day (QID) | INTRAMUSCULAR | Status: DC | PRN

## 2021-09-15 MED ORDER — LACTATED RINGERS IV BOLUS
500.0000 mL | Freq: Once | INTRAVENOUS | Status: AC
  Administered 2021-09-15: 500 mL via INTRAVENOUS

## 2021-09-15 MED ORDER — MIRABEGRON ER 50 MG PO TB24
50.0000 mg | ORAL_TABLET | Freq: Every day | ORAL | Status: DC
  Administered 2021-09-15 – 2021-09-24 (×9): 50 mg via ORAL
  Filled 2021-09-15 (×10): qty 1

## 2021-09-15 MED ORDER — METOPROLOL SUCCINATE ER 50 MG PO TB24
50.0000 mg | ORAL_TABLET | Freq: Every day | ORAL | Status: DC
  Administered 2021-09-15: 50 mg via ORAL
  Filled 2021-09-15 (×2): qty 1

## 2021-09-15 MED ORDER — FLUTICASONE PROPIONATE 50 MCG/ACT NA SUSP
2.0000 | Freq: Every day | NASAL | Status: DC
  Administered 2021-09-15 – 2021-09-24 (×10): 2 via NASAL
  Filled 2021-09-15: qty 16

## 2021-09-15 MED ORDER — SODIUM CHLORIDE 0.9 % IV SOLN
2.0000 g | Freq: Every day | INTRAVENOUS | Status: AC
  Administered 2021-09-15 – 2021-09-21 (×7): 2 g via INTRAVENOUS
  Filled 2021-09-15: qty 20
  Filled 2021-09-15: qty 2
  Filled 2021-09-15 (×4): qty 20
  Filled 2021-09-15: qty 2

## 2021-09-15 MED ORDER — ATORVASTATIN CALCIUM 20 MG PO TABS
40.0000 mg | ORAL_TABLET | Freq: Every evening | ORAL | Status: DC
Start: 2021-09-15 — End: 2021-09-24
  Administered 2021-09-15 – 2021-09-23 (×8): 40 mg via ORAL
  Filled 2021-09-15 (×10): qty 2

## 2021-09-15 MED ORDER — TRAZODONE HCL 50 MG PO TABS
25.0000 mg | ORAL_TABLET | Freq: Every evening | ORAL | Status: DC | PRN
  Administered 2021-09-16: 50 mg via ORAL
  Administered 2021-09-17 – 2021-09-18 (×2): 25 mg via ORAL
  Administered 2021-09-19 – 2021-09-22 (×3): 50 mg via ORAL
  Filled 2021-09-15 (×6): qty 1

## 2021-09-15 MED ORDER — ONDANSETRON HCL 4 MG/2ML IJ SOLN: 4.0000 mg | Freq: Four times a day (QID) | INTRAMUSCULAR | Status: DC | PRN

## 2021-09-15 MED ORDER — ONDANSETRON HCL 4 MG PO TABS: 4.0000 mg | ORAL_TABLET | Freq: Four times a day (QID) | ORAL | Status: DC | PRN

## 2021-09-15 MED ORDER — LACTATED RINGERS IV BOLUS
1000.0000 mL | Freq: Once | INTRAVENOUS | Status: AC
  Administered 2021-09-16: 1000 mL via INTRAVENOUS

## 2021-09-15 MED ORDER — LACTATED RINGERS IV SOLN: INTRAVENOUS | Status: AC

## 2021-09-15 MED ORDER — INSULIN ASPART 100 UNIT/ML IJ SOLN
0.0000 [IU] | Freq: Three times a day (TID) | INTRAMUSCULAR | Status: DC
  Administered 2021-09-15 – 2021-09-17 (×3): 2 [IU] via SUBCUTANEOUS
  Filled 2021-09-15 (×4): qty 1

## 2021-09-15 MED ORDER — ESCITALOPRAM OXALATE 10 MG PO TABS
5.0000 mg | ORAL_TABLET | Freq: Every day | ORAL | Status: DC
  Administered 2021-09-15 – 2021-09-24 (×9): 5 mg via ORAL
  Filled 2021-09-15 (×10): qty 0.5

## 2021-09-15 MED ORDER — TORSEMIDE 20 MG PO TABS: 20.0000 mg | ORAL_TABLET | Freq: Every day | ORAL | Status: DC

## 2021-09-15 MED ORDER — BRINZOLAMIDE 1 % OP SUSP
1.0000 [drp] | Freq: Three times a day (TID) | OPHTHALMIC | Status: DC
  Administered 2021-09-15 – 2021-09-24 (×25): 1 [drp] via OPHTHALMIC
  Filled 2021-09-15: qty 10

## 2021-09-15 MED ORDER — OXYCODONE HCL 5 MG PO TABS
10.0000 mg | ORAL_TABLET | Freq: Four times a day (QID) | ORAL | Status: DC | PRN
  Administered 2021-09-15 (×2): 10 mg via ORAL
  Filled 2021-09-15 (×2): qty 2

## 2021-09-15 MED ORDER — POLYETHYLENE GLYCOL 3350 17 G PO PACK: 17.0000 g | PACK | Freq: Every day | ORAL | Status: DC | PRN

## 2021-09-15 MED ORDER — ACETAMINOPHEN 650 MG RE SUPP
650.0000 mg | Freq: Four times a day (QID) | RECTAL | Status: DC | PRN
  Filled 2021-09-15: qty 2

## 2021-09-15 MED ORDER — OXYCODONE HCL 5 MG PO TABS
10.0000 mg | ORAL_TABLET | Freq: Four times a day (QID) | ORAL | Status: DC | PRN
  Administered 2021-09-16 – 2021-09-24 (×22): 10 mg via ORAL
  Filled 2021-09-15 (×22): qty 2

## 2021-09-15 MED ORDER — APIXABAN 5 MG PO TABS
5.0000 mg | ORAL_TABLET | Freq: Two times a day (BID) | ORAL | Status: DC
  Administered 2021-09-15 – 2021-09-18 (×8): 5 mg via ORAL
  Filled 2021-09-15 (×9): qty 1

## 2021-09-15 MED ORDER — AMIODARONE HCL 200 MG PO TABS
200.0000 mg | ORAL_TABLET | Freq: Two times a day (BID) | ORAL | Status: DC
  Administered 2021-09-15 – 2021-09-24 (×18): 200 mg via ORAL
  Filled 2021-09-15 (×19): qty 1

## 2021-09-15 NOTE — Assessment & Plan Note (Signed)
.   Resume patients home regimen of oral antihypertensives . Titrate antihypertensive regimen as necessary to achieve adequate BP control . PRN intravenous antihypertensives for excessively elevated blood pressure   

## 2021-09-15 NOTE — Assessment & Plan Note (Addendum)
·   Patient presenting with 4-day history of increasing malaise, low-grade fevers and increasing weakness with difficulty with ambulation  Upon further questioning patient has also been developing diffuse splotchy redness and warmth of the left lower extremity concerning for cellulitis as a potential source of infection  Extensive work-up negative for other potential sources of infection.  While patient does not exhibit a leukocytosis while here, procalcitonin is markedly elevated at 5.38 concerning for ongoing bacterial infection.  Considering patient's complicated history of severe recurrent bacterial infections, particularly histories of both strep and MRSA bacteremia, will aggressively treat for developing cellulitis  While patient does complain of right hip pain and does have fluid in the right hip on CT imaging -this was present during the hospitalization in November with arthrocentesis revealing no evidence of infection at that time.  I believe this is mostly due to arthritis and do not clinically think the patient has an infection in this hip.  Placing patient on intravenous ceftriaxone  Gentle intravenous hydration with isotonic fluids  Obtaining blood cultures

## 2021-09-15 NOTE — Assessment & Plan Note (Signed)
.   Patient been placed on Accu-Cheks before every meal and nightly with sliding scale insulin . Holding home regimen of hypoglycemics . Hemoglobin A1C ordered . Diabetic Diet  

## 2021-09-15 NOTE — Assessment & Plan Note (Signed)
.   Resume home regimen of Synthroid 

## 2021-09-15 NOTE — Plan of Care (Signed)

## 2021-09-15 NOTE — Assessment & Plan Note (Signed)
•   Patient exhibiting evidence of acute kidney injury secondary to volume depletion  Creatinine is currently 1.51 an increase compared to baseline of 1.08 suggestive of early mild renal injury  Hydrating patient gently with intravenous isotonic fluids, holding home regimen of torsemide  Strict input and output monitoring  Monitoring renal function and electrolytes with serial chemistries  Avoiding nephrotoxic agents if at all possible

## 2021-09-15 NOTE — Progress Notes (Signed)
°   09/15/21 2028  Vitals  Temp 98.5 F (36.9 C)  BP (!) 88/49  MAP (mmHg) (!) 63  BP Method Automatic  Pulse Rate 78  Pulse Rate Source Monitor  Resp 18  MEWS COLOR  MEWS Score Color Green  Oxygen Therapy  SpO2 92 %  O2 Device Room Air  MEWS Score  MEWS Temp 0  MEWS Systolic 1  MEWS Pulse 0  MEWS RR 0  MEWS LOC 0  MEWS Score 1   [place] notified.

## 2021-09-15 NOTE — Progress Notes (Signed)
PT Cancellation Note  Patient Details Name: Becky Trevino MRN: [id number] DOB: 1946/04/18   Cancelled Treatment:    Reason Eval/Treat Not Completed: Fatigue/lethargy limiting ability to participate. Orders received and chart reviewed. Husband present in room. Pt appearing lethargic, somewhat alert, oriented to person and year (initially stating 2023, later stating 1923). But is able to report birth date and husband's name. Per husband pt is acting abnormally, pt is having difficulty with word finding, requiring frequent redirection for pt to attempt to answer asked question from therapist with difficulty answering. RN aware and present in room stating she was given Oxycodone earlier this afternoon. Pt's vitals WNL, BP at 102/70mm Hg, SPO2 >90%, HR in mid 80's to low 90's BPM. PT will plan to hold until more alert for pt safety as per EMR pt does require heavy assist at baseline with bed mobility and transfers. Will re-attempt as available and medically appropriate.   Salem Caster. Fairly IV, PT, DPT Physical Therapist- [facility]  09/15/2021, 2:52 PM

## 2021-09-15 NOTE — TOC CM/SW Note (Signed)
Patient is active with [facility] - for RN and PT services.  Oleh Genin, Tony

## 2021-09-15 NOTE — Assessment & Plan Note (Signed)
·   Currently rate controlled.  Continue home regimen of anticoagulation with Eliquis  Continue home regimen of rate controlling agent with amiodarone and metoprolol  Monitoring on telemetry

## 2021-09-15 NOTE — Progress Notes (Signed)
Becky Trevino  NKN:[id number] DOB: 06/01/1946 DOA: 09/14/2021 PCP: [name], MD   Brief Narrative:  76 year old female presenting from home with past medical history of paroxysmal atrial fibrillation (on Eliquis), hyperlipidemia, hypertension, history of SVT, pulmonary hypertension, chronic kidney disease stage IIIB, non-insulin-dependent diabetes mellitus type 2, obesity, obstructive sleep apnea, chronic DVT of the right lower extremity status post IVC filter placement, lymphedema, hypothyroidism who presents to [facility] emergency department with complaints of fevers and weakness.  Presents with 3 days of fever, malaise noted to have left leg cellulitis improving with IV antibiotics as below.  Assessment & Plan:   Cellulitis of medial left lower extremity- (present on admission) - Patient does NOT meet sepsis criteria  - Profound history of bacteremia(Strep and MRSA), follow cultures -preliminary negative - Hip effusion noted prior to this hospitalization with arthrocentesis that was unremarkable Nov 22. - Continue Ceftriaxone   Acute renal failure superimposed on stage 3b chronic kidney disease, resolving -Baseline creatinine around 1.1-1.2, currently 1.3 down from 1.5 at intake -Continue IV fluids, increase p.o. intake as tolerated   Elevated troponin level in the setting of dehydration, ACS ruled out -Likely secondary to AKI and profound dehydration, without chest pain or EKG changes   Paroxysmal atrial fibrillation, rate controlled  Currently rate controlled. Continue home regimen of anticoagulation with Eliquis Continue home regimen of rate controlling agent with amiodarone and metoprolol   Type 2 diabetes mellitus with stage 3b chronic kidney disease, without long-term current use of insulin ([name])- (present on admission) Well controlled - continue home meds     Essential hypertension - Continue home meds  Hypothyroidism- (present on admission)-continue  home Synthroid    DVT prophylaxis: Eliquis Code Status: Full Family Communication: None present  Status is: Observation  Dispo: The patient is from: Home              Anticipated d/c is to: Home              Anticipated d/c date is: 24 to 48 hours pending clinical course              Patient currently not medically stable for discharge  Consultants:  None  Procedures:  None  Antimicrobials:  Ceftriaxone  Subjective: No acute issues or events overnight denies nausea vomiting diarrhea constipation headache fevers chills chest pain shortness of breath.  Objective: Vitals:   09/14/21 2315 09/14/21 2330 09/15/21 0000 09/15/21 0030  BP:  106/61 103/62 106/61  Pulse: 80 79 78 76  Resp: 14 14 15 17   Temp:      TempSrc:      SpO2: 97% 97% 97% 98%  Weight:      Height:        Intake/Output Summary (Last 24 hours) at 09/15/2021 0732 Last data filed at 09/15/2021 0451 Gross per 24 hour  Intake 100.33 ml  Output --  Net 100.33 ml   Filed Weights   09/14/21 1654  Weight: 136.1 kg    Examination:  General:  Pleasantly resting in bed, No acute distress. HEENT:  Normocephalic atraumatic.  Sclerae nonicteric, noninjected.  Extraocular movements intact bilaterally. Neck:  Without mass or deformity.  Trachea is midline. Lungs:  Clear to auscultate bilaterally without rhonchi, wheeze, or rales. Heart:  Regular rate and rhythm.  Without murmurs, rubs, or gallops. Abdomen:  Soft, nontender, nondistended.  Without guarding or rebound. Extremities: Without cyanosis, clubbing, edema, or obvious deformity. Vascular:  Dorsalis pedis and posterior tibial pulses  palpable bilaterally. Skin:  Warm and dry, left medial thigh blanching erythema withdrawing from previous demarcation/outline in the ED  Data Reviewed: I have personally reviewed following labs and imaging studies  CBC: Recent Labs  Lab 09/14/21 1657 09/15/21 0533  WBC 8.5 8.6  NEUTROABS 7.6 7.3  HGB 12.0 11.3*  HCT  37.3 34.6*  MCV 98.2 96.1  PLT 137* 657*   Basic Metabolic Panel: Recent Labs  Lab 09/14/21 1657 09/15/21 0533  NA 135 132*  K 3.8 3.7  CL 99 101  CO2 26 22  GLUCOSE 106* 100*  BUN 23 22  CREATININE 1.51* 1.31*  CALCIUM 9.1 8.8*  MG  --  2.1   GFR: Estimated Creatinine Clearance: 56.8 mL/min (A) (by C-G formula based on SCr of 1.31 mg/dL (H)). Liver Function Tests: Recent Labs  Lab 09/14/21 1657 09/15/21 0533  AST 29 21  ALT 20 18  ALKPHOS 66 60  BILITOT 1.1 1.1  PROT 8.2* 7.5  ALBUMIN 3.7 3.2*   No results for input(s): LIPASE, AMYLASE in the last 168 hours. No results for input(s): AMMONIA in the last 168 hours. Coagulation Profile: No results for input(s): INR, PROTIME in the last 168 hours. Cardiac Enzymes: No results for input(s): CKTOTAL, CKMB, CKMBINDEX, TROPONINI in the last 168 hours. BNP (last 3 results) No results for input(s): PROBNP in the last 8760 hours. HbA1C: No results for input(s): HGBA1C in the last 72 hours. CBG: No results for input(s): GLUCAP in the last 168 hours. Lipid Profile: No results for input(s): CHOL, HDL, LDLCALC, TRIG, CHOLHDL, LDLDIRECT in the last 72 hours. Thyroid Function Tests: No results for input(s): TSH, T4TOTAL, FREET4, T3FREE, THYROIDAB in the last 72 hours. Anemia Panel: No results for input(s): VITAMINB12, FOLATE, FERRITIN, TIBC, IRON, RETICCTPCT in the last 72 hours. Sepsis Labs: Recent Labs  Lab 09/14/21 1657 09/14/21 1943  PROCALCITON 5.38  --   LATICACIDVEN 1.6 1.6    Recent Results (from the past 240 hour(s))  Resp Panel by RT-PCR (Flu A&B, Covid) Nasopharyngeal Swab     Status: None   Collection Time: 09/14/21  4:57 PM   Specimen: Nasopharyngeal Swab; Nasopharyngeal(NP) swabs in vial transport medium  Result Value Ref Range Status   SARS Coronavirus 2 by RT PCR NEGATIVE NEGATIVE Final    Comment: (NOTE) SARS-CoV-2 target nucleic acids are NOT DETECTED.  The SARS-CoV-2 RNA is generally detectable  in upper respiratory specimens during the acute phase of infection. The lowest concentration of SARS-CoV-2 viral copies this assay can detect is 138 copies/mL. A negative result does not preclude SARS-Cov-2 infection and should not be used as the sole basis for treatment or other patient management decisions. A negative result may occur with  improper specimen collection/handling, submission of specimen other than nasopharyngeal swab, presence of viral mutation(s) within the areas targeted by this assay, and inadequate number of viral copies(<138 copies/mL). A negative result must be combined with clinical observations, patient history, and epidemiological information. The expected result is Negative.  Fact Sheet for Patients:  EntrepreneurPulse.com.au  Fact Sheet for Healthcare Providers:  IncredibleEmployment.be  This test is no t yet approved or cleared by the [place] FDA and  has been authorized for detection and/or diagnosis of SARS-CoV-2 by FDA under an Emergency Use Authorization (EUA). This EUA will remain  in effect (meaning this test can be used) for the duration of the COVID-19 declaration under Section 564(b)(1) of the Act, 21 U.S.C.section 360bbb-3(b)(1), unless the authorization is terminated  or revoked sooner.  Influenza A by PCR NEGATIVE NEGATIVE Final   Influenza B by PCR NEGATIVE NEGATIVE Final    Comment: (NOTE) The Xpert Xpress SARS-CoV-2/FLU/RSV plus assay is intended as an aid in the diagnosis of influenza from Nasopharyngeal swab specimens and should not be used as a sole basis for treatment. Nasal washings and aspirates are unacceptable for Xpert Xpress SARS-CoV-2/FLU/RSV testing.  Fact Sheet for Patients: EntrepreneurPulse.com.au  Fact Sheet for Healthcare Providers: IncredibleEmployment.be  This test is not yet approved or cleared by the [place] FDA and has  been authorized for detection and/or diagnosis of SARS-CoV-2 by FDA under an Emergency Use Authorization (EUA). This EUA will remain in effect (meaning this test can be used) for the duration of the COVID-19 declaration under Section 564(b)(1) of the Act, 21 U.S.C. section 360bbb-3(b)(1), unless the authorization is terminated or revoked.  Performed at [facility], [address] [place], [place] [address]   Culture, blood (Routine X 2) w Reflex to ID Panel     Status: None (Preliminary result)   Collection Time: 09/14/21 11:15 PM   Specimen: BLOOD  Result Value Ref Range Status   Specimen Description BLOOD RIGHT ANTECUBITAL  Final   Special Requests   Final    BOTTLES DRAWN AEROBIC AND ANAEROBIC Blood Culture results may not be optimal due to an excessive volume of blood received in culture bottles   Culture   Final    NO GROWTH < 12 HOURS Performed at [facility], [address], [place], [place] [address]    Report Status PENDING  Incomplete  Resp Panel by RT-PCR (Flu A&B, Covid) Nasopharyngeal Swab     Status: None   Collection Time: 09/14/21 11:15 PM   Specimen: Nasopharyngeal Swab; Nasopharyngeal(NP) swabs in vial transport medium  Result Value Ref Range Status   SARS Coronavirus 2 by RT PCR NEGATIVE NEGATIVE Final    Comment: (NOTE) SARS-CoV-2 target nucleic acids are NOT DETECTED.  The SARS-CoV-2 RNA is generally detectable in upper respiratory specimens during the acute phase of infection. The lowest concentration of SARS-CoV-2 viral copies this assay can detect is 138 copies/mL. A negative result does not preclude SARS-Cov-2 infection and should not be used as the sole basis for treatment or other patient management decisions. A negative result may occur with  improper specimen collection/handling, submission of specimen other than nasopharyngeal swab, presence of viral mutation(s) within the areas targeted by this assay, and inadequate number of  viral copies(<138 copies/mL). A negative result must be combined with clinical observations, patient history, and epidemiological information. The expected result is Negative.  Fact Sheet for Patients:  EntrepreneurPulse.com.au  Fact Sheet for Healthcare Providers:  IncredibleEmployment.be  This test is no t yet approved or cleared by the [place] FDA and  has been authorized for detection and/or diagnosis of SARS-CoV-2 by FDA under an Emergency Use Authorization (EUA). This EUA will remain  in effect (meaning this test can be used) for the duration of the COVID-19 declaration under Section 564(b)(1) of the Act, 21 U.S.C.section 360bbb-3(b)(1), unless the authorization is terminated  or revoked sooner.       Influenza A by PCR NEGATIVE NEGATIVE Final   Influenza B by PCR NEGATIVE NEGATIVE Final    Comment: (NOTE) The Xpert Xpress SARS-CoV-2/FLU/RSV plus assay is intended as an aid in the diagnosis of influenza from Nasopharyngeal swab specimens and should not be used as a sole basis for treatment. Nasal washings and aspirates are unacceptable for Xpert Xpress SARS-CoV-2/FLU/RSV testing.  Fact Sheet for Patients: EntrepreneurPulse.com.au  Fact Sheet for Healthcare Providers: IncredibleEmployment.be  This test is not yet approved or cleared by the [place] FDA and has been authorized for detection and/or diagnosis of SARS-CoV-2 by FDA under an Emergency Use Authorization (EUA). This EUA will remain in effect (meaning this test can be used) for the duration of the COVID-19 declaration under Section 564(b)(1) of the Act, 21 U.S.C. section 360bbb-3(b)(1), unless the authorization is terminated or revoked.  Performed at [facility], [address] [place], [place] [address]   Culture, blood (Routine X 2) w Reflex to ID Panel     Status: None (Preliminary result)   Collection Time:  09/15/21  2:26 AM   Specimen: BLOOD  Result Value Ref Range Status   Specimen Description BLOOD BLOOD RIGHT HAND  Final   Special Requests   Final    BOTTLES DRAWN AEROBIC AND ANAEROBIC Blood Culture adequate volume   Culture   Final    NO GROWTH < 12 HOURS Performed at [facility], [address], [place], [place] [address]    Report Status PENDING  Incomplete         Radiology Studies: DG Chest 2 View  Result Date: 09/14/2021 CLINICAL DATA:  Fever. EXAM: CHEST - 2 VIEW COMPARISON:  July 15, 2021 FINDINGS: Decreased lung volumes are seen with mild areas of atelectasis and/or infiltrate noted within the bilateral lung bases. There is no evidence of a pleural effusion or pneumothorax. The heart size and mediastinal contours are within normal limits. Chronic deformities are seen involving the bilateral shoulders. Degenerative changes are seen within the visualized portion of the cervical spine. Multilevel degenerative changes are noted throughout the thoracic spine. IMPRESSION: Mild bibasilar atelectasis and/or infiltrate. Electronically Signed   By: Virgina Norfolk M.D.   On: 09/14/2021 19:39   CT CHEST WO CONTRAST  Result Date: 09/14/2021 CLINICAL DATA:  Fever of unknown origin. EXAM: CT CHEST WITHOUT CONTRAST TECHNIQUE: Multidetector CT imaging of the chest was performed following the standard protocol without IV contrast. COMPARISON:  February 23, 2021 FINDINGS: Cardiovascular: There is mild calcification of the aortic arch, without evidence of aortic aneurysm. Normal heart size with moderate severity coronary artery calcification. No pericardial effusion. Mediastinum/Nodes: A 2.3 cm x 1.7 cm axillary lymph node is suspected (axial CT image 21, CT series 2). A stable, similar appearing 3.0 cm x 2.4 cm low-attenuation soft tissue mass is seen in medial to the left glenoid (axial CT image 1, CT series 2). Thyroid gland, trachea, and esophagus demonstrate no significant findings.  Lungs/Pleura: Mild atelectasis is seen within the posterior aspects of the bilateral lower lobes. There is no evidence of a pleural effusion or pneumothorax. Upper Abdomen: No acute abnormality. Musculoskeletal: Marked severity degenerative changes are seen involving the right shoulder, with a large chronic appearing deformity seen involving the left humeral head and left glenoid. Multilevel degenerative changes seen throughout the thoracic spine. IMPRESSION: 1. Mild bilateral lower lobe atelectasis. 2. Moderate severity coronary artery calcification. 3. Marked severity degenerative changes involving the right shoulder, with a large chronic appearing deformity involving the left humeral head and left glenoid. 4. Aortic atherosclerosis. Aortic Atherosclerosis (ICD10-I70.0). Electronically Signed   By: Virgina Norfolk M.D.   On: 09/14/2021 22:33   CT Hip Right Wo Contrast  Result Date: 09/14/2021 CLINICAL DATA:  Right hip pain and fever. EXAM: CT OF THE RIGHT HIP WITHOUT CONTRAST TECHNIQUE: Multidetector CT imaging of the right hip was performed according to  the standard protocol. Multiplanar CT image reconstructions were also generated. COMPARISON:  None. FINDINGS: Bones/Joint/Cartilage There is no evidence of acute fracture or dislocation. 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 Marked severity chronic and degenerative changes are seen involving the right hip. This is in the form of joint space narrowing, acetabular sclerosis and subchondral cyst formation. Mild flattening of the right humeral head is also seen. Ligaments Suboptimally assessed by CT. Muscles and Tendons Unremarkable Soft tissues A right hip effusion is noted. IMPRESSION: 1. No evidence of an acute fracture or dislocation. 2. Marked severity chronic and degenerative changes involving the right hip. 3. Right hip effusion. Electronically Signed   By: Virgina Norfolk M.D.   On: 09/14/2021 22:40         Scheduled Meds:  amiodarone  200 mg Oral BID   apixaban  5 mg Oral BID   aspirin EC  81 mg Oral Daily   atorvastatin  40 mg Oral QPM   brinzolamide  1 drop Both Eyes TID   And   brimonidine  1 drop Both Eyes TID   escitalopram  5 mg Oral Daily   fluticasone  2 spray Each Nare Daily   insulin aspart  0-15 Units Subcutaneous TID AC & HS   latanoprost  1 drop Both Eyes QHS   levothyroxine  25 mcg Oral Q0600   metoprolol succinate  50 mg Oral Q breakfast   mirabegron ER  50 mg Oral Daily   Continuous Infusions:  cefTRIAXone (ROCEPHIN)  IV Stopped (09/15/21 0451)   lactated ringers 100 mL/hr at 09/15/21 0449     LOS: 0 days   Time spent: 47min  Rajvi Armentor C Terrian Sentell, DO [facility]  If 7PM-7AM, please contact night-coverage [email or web address]  09/15/2021, 7:32 AM

## 2021-09-15 NOTE — Assessment & Plan Note (Signed)
·   Minimal elevation of troponin in the setting of mild renal injury  Patient is chest pain-free  Monitoring patient on telemetry  No evidence of dynamic ST segment changes.

## 2021-09-16 LAB — BASIC METABOLIC PANEL
Anion gap: 8 (ref 5–15)
BUN: 23 mg/dL (ref 8–23)
CO2: 24 mmol/L (ref 22–32)
Calcium: 8.7 mg/dL — ABNORMAL LOW (ref 8.9–10.3)
Chloride: 103 mmol/L (ref 98–111)
Creatinine, Ser: 1.39 mg/dL — ABNORMAL HIGH (ref 0.44–1.00)
GFR, Estimated: 40 mL/min — ABNORMAL LOW (ref >60)
Glucose, Bld: 84 mg/dL (ref 70–99)
Potassium: 4.1 mmol/L (ref 3.5–5.1)
Sodium: 135 mmol/L (ref 135–145)

## 2021-09-16 LAB — BLOOD CULTURE ID PANEL (REFLEXED) - BCID2

## 2021-09-16 LAB — CBC
HCT: 33 % — ABNORMAL LOW (ref 36.0–46.0)
Hemoglobin: 10.3 g/dL — ABNORMAL LOW (ref 12.0–15.0)
MCH: 30.8 pg (ref 26.0–34.0)
MCHC: 31.2 g/dL (ref 30.0–36.0)
MCV: 98.8 fL (ref 80.0–100.0)
Platelets: 121 10*3/uL — ABNORMAL LOW (ref 150–400)
RBC: 3.34 MIL/uL — ABNORMAL LOW (ref 3.87–5.11)
RDW: 18.4 % — ABNORMAL HIGH (ref 11.5–15.5)
WBC: 9 10*3/uL (ref 4.0–10.5)
nRBC: 0 % (ref 0.0–0.2)

## 2021-09-16 LAB — GLUCOSE, CAPILLARY
Glucose-Capillary: 76 mg/dL (ref 70–99)
Glucose-Capillary: 96 mg/dL (ref 70–99)
Glucose-Capillary: 107 mg/dL — ABNORMAL HIGH (ref 70–99)
Glucose-Capillary: 102 mg/dL — ABNORMAL HIGH (ref 70–99)

## 2021-09-16 LAB — LACTIC ACID, PLASMA: Lactic Acid, Venous: 1.4 mmol/L (ref 0.5–1.9)

## 2021-09-16 MED ORDER — LACTATED RINGERS IV SOLN: Freq: Once | INTRAVENOUS | Status: AC

## 2021-09-16 NOTE — Plan of Care (Signed)

## 2021-09-16 NOTE — TOC Progression Note (Signed)
Transition of Care [facility]) - Progression Note    Patient Details  Name: Becky Trevino MRN: [id number] Date of Birth: Jan 11, 1946  Transition of Care [facility]) CM/SW [place], RN Phone Number: 09/16/2021, 10:04 AM  Clinical Narrative:   TOC continues to follow the patient, she lives at home with her husband and is open with [facility] to complete eval once patient able         Expected Discharge Plan and Services                                                 Social Determinants of Health (SDOH) Interventions    Readmission Risk Interventions No flowsheet data found.

## 2021-09-16 NOTE — Evaluation (Signed)
Occupational Therapy Evaluation Patient Details Name: Becky Trevino MRN: [id number] DOB: December 02, 1945 Today's Date: 09/16/2021   History of Present Illness 76 year old female presenting from home with past medical history of paroxysmal atrial fibrillation (on Eliquis), hyperlipidemia, hypertension, history of SVT, pulmonary hypertension, chronic kidney disease stage IIIB, non-insulin-dependent diabetes mellitus type 2, obesity, obstructive sleep apnea, chronic DVT of the right lower extremity status post IVC filter placement, lymphedema, hypothyroidism who presents to [facility] emergency department with complaints of fevers and weakness.  Presents with 3 days of fever, malaise noted to have left leg cellulitis improving with IV antibiotics as below.   Clinical Impression   Pt seen for OT evaluation this date. Upon arrival to room, pt seated upright in bed with husband present. Pt A&Ox4 and agreeable to OT tx. At baseline, pt performs UB ADLs independently, walks short household distances with RW (uses electric w/c for further distances), and requires physical assist for LB ADLs (only receives occasional assist for toileting). Pt currently requires MOD A+2 for bed mobility, SUPERVISION/SET-UP for seated grooming tasks, MAX A for seated LB dressing, MOD A+2 for sit<>stand transfers, and MOD A+2 to take lateral steps toward [facility] with RW due to R LE pain, decreased strength, and decreased balance. Pt would benefit from additional skilled OT services to maximize return to PLOF and minimize risk of future falls, injury, caregiver burden, and readmission. Upon discharge, recommend SNF.      Recommendations for follow up therapy are one component of a multi-disciplinary discharge planning process, led by the attending physician.  Recommendations may be updated based on patient status, additional functional criteria and insurance authorization.   Follow Up Recommendations  Skilled nursing-short term rehab (<3  hours/day)    Assistance Recommended at Discharge Intermittent Supervision/Assistance  Functional Status Assessment  Patient has had a recent decline in their functional status and demonstrates the ability to make significant improvements in function in a reasonable and predictable amount of time.  Equipment Recommendations  Other (comment) (defer to next venue of care)       Precautions / Restrictions Precautions Precautions: Fall Restrictions Weight Bearing Restrictions: No      Mobility Bed Mobility Overal bed mobility: Needs Assistance Bed Mobility: Supine to Sit;Sit to Supine     Supine to sit: Min guard;HOB elevated Sit to supine: Mod assist;+2 for physical assistance        Transfers Overall transfer level: Needs assistance Equipment used: Rolling walker (2 wheels) Transfers: Sit to/from Stand Sit to Stand: From elevated surface;Mod assist;+2 physical assistance                  Balance Overall balance assessment: Needs assistance Sitting-balance support: Feet supported;No upper extremity supported Sitting balance-Leahy Scale: Fair Sitting balance - Comments: able to maintain static sitting with UE support and close supervision   Standing balance support: Bilateral upper extremity supported;Reliant on assistive device for balance Standing balance-Leahy Scale: Poor Standing balance comment: MIN A+2 to take lateral steps toward [facility]                           ADL either performed or assessed with clinical judgement   ADL Overall ADL's : Needs assistance/impaired     Grooming: Wash/dry face;Set up;Supervision/safety;Sitting               Lower Body Dressing: Maximal assistance;Sitting/lateral leans Lower Body Dressing Details (indicate cue type and reason): pt able to reach socks, but requires MAX A  to don socks while seated EOB                     Vision Baseline Vision/History: 1 Wears glasses Ability to See in Adequate  Light: 0 Adequate Patient Visual Report: No change from baseline              Pertinent Vitals/Pain Pain Assessment: 0-10 Pain Score: 9  Pain Location: R hip Pain Descriptors / Indicators: Discomfort;Grimacing Pain Intervention(s): Limited activity within patient's tolerance;Monitored during session;Repositioned;Patient requesting pain meds-RN notified     Hand Dominance Right   Extremity/Trunk Assessment Upper Extremity Assessment Upper Extremity Assessment: RUE deficits/detail;LUE deficits/detail;Generalized weakness RUE Deficits / Details: Limited shoulder elevation < 60 degrees RUE Sensation: WNL RUE Coordination: WNL LUE Deficits / Details: Limited shoulder elevation < 60 degrees LUE Sensation: WNL LUE Coordination: WNL   Lower Extremity Assessment Lower Extremity Assessment: Generalized weakness       Communication Communication Communication: No difficulties   Cognition Arousal/Alertness: Awake/alert Behavior During Therapy: WFL for tasks assessed/performed Overall Cognitive Status: Within Functional Limits for tasks assessed                                 General Comments: A&Ox4.                Home Living Family/patient expects to be discharged to:: Private residence Living Arrangements: Spouse/significant other Available Help at Discharge: Family;Available 24 hours/day Type of Home: Apartment Home Access: Level entry     Home Layout: One level     Bathroom Shower/Tub: Tub/shower unit;Sponge bathes at baseline   Bathroom Toilet: Handicapped height     Home Equipment: Conservation officer, nature (2 wheels);Hospital bed;BSC/3in1;Wheelchair - power (hover round electric w/c)          Prior Functioning/Environment Prior Level of Function : Needs assist       Physical Assist : Mobility (physical);ADLs (physical)   ADLs (physical): Dressing;Bathing Mobility Comments: Husband assists with bed mobility. Pt relies on hospital bed to stand  from elevated surfaces, utilizes electric w/c for most mobility. Amb with RW very short distances. ADLs Comments: Husband assists with LB bathing, dressing, and toileting (on occassion).        OT Problem List: Decreased strength;Decreased range of motion;Decreased activity tolerance;Impaired balance (sitting and/or standing);Decreased safety awareness;Decreased knowledge of precautions;Impaired UE functional use;Pain      OT Treatment/Interventions: Self-care/ADL training;Therapeutic exercise;Energy conservation;DME and/or AE instruction;Therapeutic activities;Patient/family education;Balance training    OT Goals(Current goals can be found in the care plan section) Acute Rehab OT Goals Patient Stated Goal: to have less pain OT Goal Formulation: With patient Time For Goal Achievement: 09/30/21 Potential to Achieve Goals: Good ADL Goals Pt Will Perform Upper Body Bathing: with set-up;sitting Pt Will Transfer to Toilet: with min assist;with +2 assist;stand pivot transfer;bedside commode Pt Will Perform Toileting - Clothing Manipulation and hygiene: with mod assist;sit to/from stand  OT Frequency: Min 2X/week    AM-PAC OT "6 Clicks" Daily Activity     Outcome Measure Help from another person eating meals?: None Help from another person taking care of personal grooming?: A Little Help from another person toileting, which includes using toliet, bedpan, or urinal?: Total Help from another person bathing (including washing, rinsing, drying)?: A Lot Help from another person to put on and taking off regular upper body clothing?: A Little Help from another person to put on and taking off regular lower body clothing?:  A Lot 6 Click Score: 15   End of Session Equipment Utilized During Treatment: Gait belt;Rolling walker (2 wheels) Nurse Communication: Mobility status;Patient requests pain meds  Activity Tolerance: Patient tolerated treatment well Patient left: in bed;with call bell/phone  within reach;with bed alarm set  OT Visit Diagnosis: Muscle weakness (generalized) (M62.81);Pain Pain - Right/Left: Right Pain - part of body: Leg                Time: 1430-1508 OT Time Calculation (min): 38 min Charges:  OT General Charges $OT Visit: 1 Visit OT Evaluation $OT Eval Moderate Complexity: 1 Mod OT Treatments $Self Care/Home Management : 8-22 mins $Therapeutic Activity: 8-22 mins  Fredirick Maudlin, OTR/L [place]

## 2021-09-16 NOTE — NC FL2 (Signed)
[place] LEVEL OF CARE SCREENING TOOL     IDENTIFICATION  Patient Name: Becky Trevino Birthdate: 27-Nov-1945 Sex: female Admission Date (Current Location): 09/14/2021  [facility] [place] and [place] Number:  Engineering geologist and Address:  [facility], [address] [place], [place] [address]      Provider Number: [id number]  Attending Physician Name and Address:  Little Ishikawa, MD  Relative Name and Phone Number:  Clarita Leber [phone]    Current Level of Care: SNF Recommended Level of Care: [place] Prior Approval Number:    Date Approved/Denied:   PASRR Number: [id number]  Discharge Plan: SNF    Current Diagnoses: Patient Active Problem List   Diagnosis Date Noted   Cellulitis of left lower extremity 09/15/2021   Elevated troponin level not due myocardial infarction 09/15/2021   AF (paroxysmal atrial fibrillation) ([place]) 09/15/2021   Fever 09/15/2021   Effusion of right hip 07/18/2021   Primary osteoarthritis of right hip 07/18/2021   Right hip joint effusion    Atherosclerosis of aorta ([place]) 06/07/2021   Anasarca    Hypothyroidism    Tachyarrhythmia    Acute renal failure superimposed on stage 3b chronic kidney disease ([place])    Hypotension    Class 2 obesity without serious comorbidity with body mass index (BMI) of 39.0 to 39.9 in adult    Thrombocytopenia (HCC)    Mild protein-calorie malnutrition ([place])    Hyperbilirubinemia    Injury of left femoral nerve 08/28/2020   History of DVT (deep vein thrombosis) 06/25/2020   Lymphedema 06/25/2020   History of pelvic hematoma 02/10/2020   Foot ulcer, right, limited to breakdown of skin ([place]) 12/02/2019   Chronic venous hypertension due to deep vein thrombosis (DVT) 10/30/2019   Hematoma    Normocytic anemia 08/07/2019   Chronic infection of right knee ([place]) 11/25/2018   Infection and inflammatory reaction due to internal joint prosthesis, subsequent  encounter 01/25/2018   Infection of total right knee replacement ([place]) 11/19/2017   Type 2 diabetes mellitus with stage 3b chronic kidney disease, without long-term current use of insulin ([place]) 11/06/2016   Primary osteoarthritis of knee 03/11/2016   Primary osteoarthritis of left hip 12/11/2015   Essential hypertension 04/21/2015   Edema leg 04/21/2015   Chronic kidney disease (CKD), stage III (moderate) (HCC) 04/21/2015   Chronic venous insufficiency 04/21/2015   DDD (degenerative disc disease), cervical 04/21/2015   Diabetes mellitus with renal manifestation ([place]) 04/21/2015   Hyperlipidemia 04/21/2015   Bladder cystocele 04/21/2015   Urinary incontinence 04/21/2015   Leg varices 04/21/2015   Insomnia 04/21/2015   Eczema intertrigo 04/21/2015   Obesity, Class III, BMI 40-49.9 (morbid obesity) ([place]) 04/21/2015   OSA (obstructive sleep apnea) 04/21/2015   Supraventricular premature beats 04/21/2015   Osteoarthritis of multiple joints 04/21/2015   H/O Spinal surgery 11/14/2013   Det[place]or muscle hypertonia 11/07/2013    Orientation RESPIRATION BLADDER Height & Weight     Self, Time, Situation, Place  Normal Continent Weight: 136.1 kg Height:  5\' 11"  (180.3 cm)  BEHAVIORAL SYMPTOMS/MOOD NEUROLOGICAL BOWEL NUTRITION STATUS      Continent Diet (regular)  AMBULATORY STATUS COMMUNICATION OF NEEDS Skin   Extensive Assist Verbally Normal                       Personal Care Assistance Level of Assistance  Dressing, Feeding, Bathing Bathing Assistance: Limited assistance Feeding assistance: Independent Dressing Assistance: Limited assistance  Functional Limitations Info             SPECIAL CARE FACTORS FREQUENCY  PT (By licensed PT), OT (By licensed OT)     PT Frequency: 5 times per week OT Frequency: 5 times per week            Contractures Contractures Info: Not present    Additional Factors Info  Code Status, Allergies Code Status Info: full  code Allergies Info: Lisinopril           Current Medications (09/16/2021):  This is the current hospital active medication list Current Facility-Administered Medications  Medication Dose Route Frequency Provider Last Rate Last Admin   acetaminophen (TYLENOL) tablet 650 mg  650 mg Oral Q6H PRN Vernelle Emerald, MD   650 mg at 09/16/21 0913   Or   acetaminophen (TYLENOL) suppository 650 mg  650 mg Rectal Q6H PRN Vernelle Emerald, MD       amiodarone (PACERONE) tablet 200 mg  200 mg Oral BID Vernelle Emerald, MD   200 mg at 09/16/21 0853   apixaban (ELIQUIS) tablet 5 mg  5 mg Oral BID Vernelle Emerald, MD   5 mg at 09/16/21 9675   aspirin EC tablet 81 mg  81 mg Oral Daily Vernelle Emerald, MD   81 mg at 09/16/21 0853   atorvastatin (LIPITOR) tablet 40 mg  40 mg Oral QPM Shalhoub, Sherryll Burger, MD   40 mg at 09/15/21 1815   brinzolamide (AZOPT) 1 % ophthalmic suspension 1 drop  1 drop Both Eyes TID Vernelle Emerald, MD   1 drop at 09/16/21 0857   And   brimonidine (ALPHAGAN) 0.2 % ophthalmic solution 1 drop  1 drop Both Eyes TID Vernelle Emerald, MD   1 drop at 09/16/21 0857   cefTRIAXone (ROCEPHIN) 2 g in sodium chloride 0.9 % 100 mL IVPB  2 g Intravenous Q0600 Vernelle Emerald, MD 200 mL/hr at 09/16/21 0551 2 g at 09/16/21 0551   escitalopram (LEXAPRO) tablet 5 mg  5 mg Oral Daily Shalhoub, Sherryll Burger, MD   5 mg at 09/16/21 0854   fluticasone (FLONASE) 50 MCG/ACT nasal spray 2 spray  2 spray Each Nare Daily Shalhoub, Sherryll Burger, MD   2 spray at 09/16/21 0857   hydrALAZINE (APRESOLINE) injection 10 mg  10 mg Intravenous Q6H PRN Shalhoub, Sherryll Burger, MD       insulin aspart (novoLOG) injection 0-15 Units  0-15 Units Subcutaneous TID AC & HS Shalhoub, Sherryll Burger, MD   2 Units at 09/15/21 2211   latanoprost (XALATAN) 0.005 % ophthalmic solution 1 drop  1 drop Both Eyes QHS Shalhoub, Sherryll Burger, MD   1 drop at 09/15/21 2210   levothyroxine (SYNTHROID) tablet 25 mcg  25 mcg Oral Q0600 Vernelle Emerald, MD   25 mcg at 09/16/21 0547   metoprolol succinate (TOPROL-XL) 24 hr tablet 50 mg  50 mg Oral Q breakfast Vernelle Emerald, MD   50 mg at 09/15/21 0834   mirabegron ER (MYRBETRIQ) tablet 50 mg  50 mg Oral Daily Vernelle Emerald, MD   50 mg at 09/16/21 0855   ondansetron (ZOFRAN) tablet 4 mg  4 mg Oral Q6H PRN Vernelle Emerald, MD       Or   ondansetron [facility]) injection 4 mg  4 mg Intravenous Q6H PRN Shalhoub, Sherryll Burger, MD       oxyCODONE (Oxy IR/ROXICODONE) immediate release tablet 10 mg  10 mg Oral Q6H PRN Little Ishikawa, MD   10 mg at 09/16/21 0436   polyethylene glycol (MIRALAX / GLYCOLAX) packet 17 g  17 g Oral Daily PRN Shalhoub, Sherryll Burger, MD       traZODone (DESYREL) tablet 25-50 mg  25-50 mg Oral QHS PRN Shalhoub, Sherryll Burger, MD         Discharge Medications: Please see discharge summary for a list of discharge medications.  Relevant Imaging Results:  Relevant Lab Results:   Additional Information ss [id number]  Conception Oms, RN

## 2021-09-16 NOTE — Progress Notes (Signed)
PHARMACY - PHYSICIAN COMMUNICATION CRITICAL VALUE ALERT - BLOOD CULTURE IDENTIFICATION (BCID)  Results for orders placed or performed during the hospital encounter of 10/27/19  Blood Culture ID Panel (Reflexed) (Collected: 10/27/2019  9:18 PM)  Result Value Ref Range   Enterococcus species NOT DETECTED NOT DETECTED   Listeria monocytogenes NOT DETECTED NOT DETECTED   Staphylococcus species NOT DETECTED NOT DETECTED   Staphylococcus aureus (BCID) NOT DETECTED NOT DETECTED   Streptococcus species DETECTED (A) NOT DETECTED   Streptococcus agalactiae DETECTED (A) NOT DETECTED   Streptococcus pneumoniae NOT DETECTED NOT DETECTED   Streptococcus pyogenes NOT DETECTED NOT DETECTED   Acinetobacter baumannii NOT DETECTED NOT DETECTED   Enterobacteriaceae species NOT DETECTED NOT DETECTED   Enterobacter cloacae complex NOT DETECTED NOT DETECTED   Escherichia coli NOT DETECTED NOT DETECTED   Klebsiella oxytoca NOT DETECTED NOT DETECTED   Klebsiella pneumoniae NOT DETECTED NOT DETECTED   Proteus species NOT DETECTED NOT DETECTED   Serratia marcescens NOT DETECTED NOT DETECTED   Haemophilus influenzae NOT DETECTED NOT DETECTED   Neisseria meningitidis NOT DETECTED NOT DETECTED   Pseudomonas aeruginosa NOT DETECTED NOT DETECTED   Candida albicans NOT DETECTED NOT DETECTED   Candida glabrata NOT DETECTED NOT DETECTED   Candida krusei NOT DETECTED NOT DETECTED   Candida parapsilosis NOT DETECTED NOT DETECTED   Candida tropicalis NOT DETECTED NOT DETECTED   BCID results:  2 of 4 vials (both anaerobic) with Streptococcus agalactiae, no resistance detected. Pt currently on Ceftriaxone 2 gm daily. No history of PCN allergy.  Recommending transition to Pen G (1st line) or Cefazolin (2nd line).  Name of physician contacted via [place]: Reece Levy, MD, Reola Mosher  Changes to prescribed antibiotics required: [place] seen by provider, but no reply has been sent at this time.  Will pass off to incoming dayshift  RPh and add them to [facility] conversation.  Addendum: MD aware and has entered ID consult.   Chinita Greenland PharmD Clinical Pharmacist 09/16/2021

## 2021-09-16 NOTE — TOC Progression Note (Signed)
Transition of Care (TOC) - Progression Note  ° ° °Patient Details  °Name: Becky Trevino °MRN: [id number] °Date of Birth: 01/09/1946 ° °Transition of Care (TOC) CM/SW Contact  °Deliliah J Louvet, RN °Phone Number: °09/16/2021, 1:36 PM ° °Clinical Narrative:   Met with the patient and her husband in the room, they are agreeable to go to [facility] , Bedsearch sent, PASSR obtained, FL2 completed, will review the bed offers once obtained ° ° ° °  °  ° °Expected Discharge Plan and Services °  °  °  °  °  °                °  °  °  °  °  °  °  °  °  °  ° ° °Social Determinants of Health (SDOH) Interventions °  ° °Readmission Risk Interventions °No flowsheet data found. ° °

## 2021-09-16 NOTE — Progress Notes (Signed)
Interim ID note- 76 yo F with pre MRSE and GBS bacteremia (08-2020), GBS (group B strep 10-2019) bacteremia, prev R prosthetic knee infection 10-2017 (Cx Coag Neg Staph, on suppressive doxy). Prosthesis originally placed 2017.  She was prev hospitalized with septic shock 07-16-2021 to 11-5 (unclear source, Cx-).  She also has a hx of DM2, R ankle ORIF (08-17-2019). L THA 11-2015.   She returns to ED now with 3 days of fever and R hip pain. She's been afebrile but has had hypotension. Her WBC and ESR are normal, her Cr has increased from baseline. However her BCx now show Group B strep.  Her CT R hip shows an effusion but no fracture.   A/P Streptococcal bacteremia- Agree with continuing her on ceftriaxone.  Would repeat her BCx I'd strongly consider having ortho see the patient given her hx of multiple prosthetics and recurrent infections.  Often rifampin is added in this situation however it may interact with eliquis. Would hold.   Dr Delaine Lame will return in the am.  Please do not hesitate to reach out to me in the intervening period.   Doroteo Bradford. Johnnye Sima, [place] [phone] pg [phone] c

## 2021-09-16 NOTE — Progress Notes (Signed)
PHARMACY - PHYSICIAN COMMUNICATION CRITICAL VALUE ALERT - BLOOD CULTURE IDENTIFICATION (BCID)  Results for orders placed or performed during the hospital encounter of 10/27/19  Blood Culture ID Panel (Reflexed) (Collected: 10/27/2019  9:18 PM)  Result Value Ref Range   Enterococcus species NOT DETECTED NOT DETECTED   Listeria monocytogenes NOT DETECTED NOT DETECTED   Staphylococcus species NOT DETECTED NOT DETECTED   Staphylococcus aureus (BCID) NOT DETECTED NOT DETECTED   Streptococcus species DETECTED (A) NOT DETECTED   Streptococcus agalactiae DETECTED (A) NOT DETECTED   Streptococcus pneumoniae NOT DETECTED NOT DETECTED   Streptococcus pyogenes NOT DETECTED NOT DETECTED   Acinetobacter baumannii NOT DETECTED NOT DETECTED   Enterobacteriaceae species NOT DETECTED NOT DETECTED   Enterobacter cloacae complex NOT DETECTED NOT DETECTED   Escherichia coli NOT DETECTED NOT DETECTED   Klebsiella oxytoca NOT DETECTED NOT DETECTED   Klebsiella pneumoniae NOT DETECTED NOT DETECTED   Proteus species NOT DETECTED NOT DETECTED   Serratia marcescens NOT DETECTED NOT DETECTED   Haemophilus influenzae NOT DETECTED NOT DETECTED   Neisseria meningitidis NOT DETECTED NOT DETECTED   Pseudomonas aeruginosa NOT DETECTED NOT DETECTED   Candida albicans NOT DETECTED NOT DETECTED   Candida glabrata NOT DETECTED NOT DETECTED   Candida krusei NOT DETECTED NOT DETECTED   Candida parapsilosis NOT DETECTED NOT DETECTED   Candida tropicalis NOT DETECTED NOT DETECTED   BCID results:  2 of 4 vials (both anaerobic) with Streptococcus agalactiae, no resistance detected. Pt currently on Ceftriaxone 2 gm daily. No history of PCN allergy.  Recommending transition to Pen G (1st line) or Cefazolin (2nd line).  Name of physician contacted via [place]: Reece Levy, MD  Changes to prescribed antibiotics required: [place] seen by provider, but no reply has been sent at this time.  Will pass off to incoming dayshift RPh and add  them to [facility] conversation.  Renda Rolls, PharmD, [facility] 09/16/2021 5:31 AM

## 2021-09-16 NOTE — Progress Notes (Signed)
PROGRESS NOTE    Becky Trevino  PZW:[id number] DOB: 06-26-1946 DOA: 09/14/2021 PCP: [name], MD   Brief Narrative:  77 year old female presenting from home with past medical history of paroxysmal atrial fibrillation (on Eliquis), hyperlipidemia, hypertension, history of SVT, pulmonary hypertension, chronic kidney disease stage IIIB, non-insulin-dependent diabetes mellitus type 2, obesity, obstructive sleep apnea, chronic DVT of the right lower extremity status post IVC filter placement, lymphedema, hypothyroidism who presents to [facility] emergency department with complaints of fevers and weakness.  Presents with 3 days of fever, malaise noted to have left leg cellulitis improving with IV antibiotics as below.  Assessment & Plan:   Cellulitis of medial left lower extremity- (present on admission) Rule out bacteremia, likely POA -Routine blood cultures now positive 4/4 - BC ID showing Streptococcus species -Infectious disease consulted given patient's remarkable somewhat convoluted infectious history -Currently on ceftriaxone, defer to ID and pharmacy on antibiotic changes - Hip effusion noted prior to this hospitalization with arthrocentesis that was unremarkable Nov 22.   Acute metabolic encephalopathy, secondary to above, POA, resolving Rule out sundowning/polypharmacy -Resolving, back to baseline per husband at bedside today -Likely secondary to above  Acute renal failure superimposed on stage 3b chronic kidney disease, resolving -Baseline creatinine around 1.1-1.2, currently 1.3 down from 1.5 at intake -Continue IV fluids, increase p.o. intake as tolerated   Elevated troponin level in the setting of dehydration, ACS ruled out -Likely secondary to AKI and profound dehydration, without chest pain or EKG changes   Paroxysmal atrial fibrillation, rate controlled  Currently rate controlled. Continue home regimen of anticoagulation with Eliquis Continue home regimen of rate  controlling agent with amiodarone and metoprolol   Type 2 diabetes mellitus with stage 3b chronic kidney disease, without long-term current use of insulin ([place])- (present on admission) Well controlled - continue home meds   Ambulatory dysfunction -limiting PT as above currently recommending SNF given unsafe ambulatory status Essential hypertension - Continue home meds  Hypothyroidism- (present on admission)-continue home Synthroid    DVT prophylaxis: Eliquis Code Status: Full Family Communication: None present  Status is: Inpatient  Dispo: The patient is from: Home              Anticipated d/c is to: SNF              Anticipated d/c date is: 24 to 48 hours pending clinical course              Patient currently not medically stable for discharge  Consultants:  None  Procedures:  None  Antimicrobials:  Ceftriaxone  Subjective: No acute issues or events overnight denies nausea vomiting diarrhea constipation headache fevers chills chest pain shortness of breath.  Right hip pain ongoing, limiting ambulation as above.  Objective: Vitals:   09/15/21 0856 09/15/21 2028 09/15/21 2342 09/16/21 0306  BP: (!) 112/55 (!) 88/49 (!) 85/60 112/61  Pulse: 85 78 81 86  Resp: 18 18  18   Temp: 97.7 F (36.5 C) 98.5 F (36.9 C)  99.1 F (37.3 C)  TempSrc:      SpO2: 100% 92%  98%  Weight:      Height:        Intake/Output Summary (Last 24 hours) at 09/16/2021 0722 Last data filed at 09/16/2021 4235 Gross per 24 hour  Intake --  Output 500 ml  Net -500 ml    Filed Weights   09/14/21 1654  Weight: 136.1 kg    Examination:  General:  Pleasantly resting in bed, No  acute distress. HEENT:  Normocephalic atraumatic.  Sclerae nonicteric, noninjected.  Extraocular movements intact bilaterally. Neck:  Without mass or deformity.  Trachea is midline. Lungs:  Clear to auscultate bilaterally without rhonchi, wheeze, or rales. Heart:  Regular rate and rhythm.  Without murmurs, rubs, or  gallops. Abdomen:  Soft, nontender, nondistended.  Without guarding or rebound. Extremities: Without cyanosis, clubbing, edema, or obvious deformity. Vascular:  Dorsalis pedis and posterior tibial pulses palpable bilaterally. Skin:  Warm and dry, left medial thigh blanching erythema withdrawing from previous demarcation/outline in the ED  Data Reviewed: I have personally reviewed following labs and imaging studies  CBC: Recent Labs  Lab 09/14/21 1657 09/15/21 0533 09/16/21 0312  WBC 8.5 8.6 9.0  NEUTROABS 7.6 7.3  --   HGB 12.0 11.3* 10.3*  HCT 37.3 34.6* 33.0*  MCV 98.2 96.1 98.8  PLT 137* 126* 121*    Basic Metabolic Panel: Recent Labs  Lab 09/14/21 1657 09/15/21 0533 09/16/21 0312  NA 135 132* 135  K 3.8 3.7 4.1  CL 99 101 103  CO2 26 22 24   GLUCOSE 106* 100* 84  BUN 23 22 23   CREATININE 1.51* 1.31* 1.39*  CALCIUM 9.1 8.8* 8.7*  MG  --  2.1  --     GFR: Estimated Creatinine Clearance: 53.5 mL/min (A) (by C-G formula based on SCr of 1.39 mg/dL (H)). Liver Function Tests: Recent Labs  Lab 09/14/21 1657 09/15/21 0533  AST 29 21  ALT 20 18  ALKPHOS 66 60  BILITOT 1.1 1.1  PROT 8.2* 7.5  ALBUMIN 3.7 3.2*    No results for input(s): LIPASE, AMYLASE in the last 168 hours. No results for input(s): AMMONIA in the last 168 hours. Coagulation Profile: No results for input(s): INR, PROTIME in the last 168 hours. Cardiac Enzymes: No results for input(s): CKTOTAL, CKMB, CKMBINDEX, TROPONINI in the last 168 hours. BNP (last 3 results) No results for input(s): PROBNP in the last 8760 hours. HbA1C: No results for input(s): HGBA1C in the last 72 hours. CBG: Recent Labs  Lab 09/15/21 0808 09/15/21 1218 09/15/21 1723 09/15/21 2156  GLUCAP 96 99 96 114*   Lipid Profile: No results for input(s): CHOL, HDL, LDLCALC, TRIG, CHOLHDL, LDLDIRECT in the last 72 hours. Thyroid Function Tests: No results for input(s): TSH, T4TOTAL, FREET4, T3FREE, THYROIDAB in the last  72 hours. Anemia Panel: No results for input(s): VITAMINB12, FOLATE, FERRITIN, TIBC, IRON, RETICCTPCT in the last 72 hours. Sepsis Labs: Recent Labs  Lab 09/14/21 1657 09/14/21 1943 09/15/21 2211 09/16/21 0137  PROCALCITON 5.38  --   --   --   LATICACIDVEN 1.6 1.6 1.3 1.4     Recent Results (from the past 240 hour(s))  Resp Panel by RT-PCR (Flu A&B, Covid) Nasopharyngeal Swab     Status: None   Collection Time: 09/14/21  4:57 PM   Specimen: Nasopharyngeal Swab; Nasopharyngeal(NP) swabs in vial transport medium  Result Value Ref Range Status   SARS Coronavirus 2 by RT PCR NEGATIVE NEGATIVE Final    Comment: (NOTE) SARS-CoV-2 target nucleic acids are NOT DETECTED.  The SARS-CoV-2 RNA is generally detectable in upper respiratory specimens during the acute phase of infection. The lowest concentration of SARS-CoV-2 viral copies this assay can detect is 138 copies/mL. A negative result does not preclude SARS-Cov-2 infection and should not be used as the sole basis for treatment or other patient management decisions. A negative result may occur with  improper specimen collection/handling, submission of specimen other than nasopharyngeal swab, presence  of viral mutation(s) within the areas targeted by this assay, and inadequate number of viral copies(<138 copies/mL). A negative result must be combined with clinical observations, patient history, and epidemiological information. The expected result is Negative.  Fact Sheet for Patients:  EntrepreneurPulse.com.au  Fact Sheet for Healthcare Providers:  IncredibleEmployment.be  This test is no t yet approved or cleared by the [place] FDA and  has been authorized for detection and/or diagnosis of SARS-CoV-2 by FDA under an Emergency Use Authorization (EUA). This EUA will remain  in effect (meaning this test can be used) for the duration of the COVID-19 declaration under Section 564(b)(1) of  the Act, 21 U.S.C.section 360bbb-3(b)(1), unless the authorization is terminated  or revoked sooner.       Influenza A by PCR NEGATIVE NEGATIVE Final   Influenza B by PCR NEGATIVE NEGATIVE Final    Comment: (NOTE) The Xpert Xpress SARS-CoV-2/FLU/RSV plus assay is intended as an aid in the diagnosis of influenza from Nasopharyngeal swab specimens and should not be used as a sole basis for treatment. Nasal washings and aspirates are unacceptable for Xpert Xpress SARS-CoV-2/FLU/RSV testing.  Fact Sheet for Patients: EntrepreneurPulse.com.au  Fact Sheet for Healthcare Providers: IncredibleEmployment.be  This test is not yet approved or cleared by the [place] FDA and has been authorized for detection and/or diagnosis of SARS-CoV-2 by FDA under an Emergency Use Authorization (EUA). This EUA will remain in effect (meaning this test can be used) for the duration of the COVID-19 declaration under Section 564(b)(1) of the Act, 21 U.S.C. section 360bbb-3(b)(1), unless the authorization is terminated or revoked.  Performed at [facility], [address] [place], [place] [address]   Culture, blood (Routine X 2) w Reflex to ID Panel     Status: None (Preliminary result)   Collection Time: 09/14/21 11:15 PM   Specimen: BLOOD  Result Value Ref Range Status   Specimen Description BLOOD RIGHT ANTECUBITAL  Final   Special Requests   Final    BOTTLES DRAWN AEROBIC AND ANAEROBIC Blood Culture results may not be optimal due to an excessive volume of blood received in culture bottles   Culture  Setup Time   Final    GRAM POSITIVE COCCI ANAEROBIC BOTTLE ONLY CRITICAL VALUE NOTED.  VALUE IS CONSISTENT WITH PREVIOUSLY REPORTED AND CALLED VALUE. Performed at [facility], [address] [place], [place] [address]    Culture [facility] POSITIVE COCCI  Final   Report Status PENDING  Incomplete  Resp Panel by RT-PCR (Flu A&B, Covid)  Nasopharyngeal Swab     Status: None   Collection Time: 09/14/21 11:15 PM   Specimen: Nasopharyngeal Swab; Nasopharyngeal(NP) swabs in vial transport medium  Result Value Ref Range Status   SARS Coronavirus 2 by RT PCR NEGATIVE NEGATIVE Final    Comment: (NOTE) SARS-CoV-2 target nucleic acids are NOT DETECTED.  The SARS-CoV-2 RNA is generally detectable in upper respiratory specimens during the acute phase of infection. The lowest concentration of SARS-CoV-2 viral copies this assay can detect is 138 copies/mL. A negative result does not preclude SARS-Cov-2 infection and should not be used as the sole basis for treatment or other patient management decisions. A negative result may occur with  improper specimen collection/handling, submission of specimen other than nasopharyngeal swab, presence of viral mutation(s) within the areas targeted by this assay, and inadequate number of viral copies(<138 copies/mL). A negative result must be combined with clinical observations, patient history, and epidemiological information. The expected result is Negative.  Fact  Sheet for Patients:  EntrepreneurPulse.com.au  Fact Sheet for Healthcare Providers:  IncredibleEmployment.be  This test is no t yet approved or cleared by the [place] FDA and  has been authorized for detection and/or diagnosis of SARS-CoV-2 by FDA under an Emergency Use Authorization (EUA). This EUA will remain  in effect (meaning this test can be used) for the duration of the COVID-19 declaration under Section 564(b)(1) of the Act, 21 U.S.C.section 360bbb-3(b)(1), unless the authorization is terminated  or revoked sooner.       Influenza A by PCR NEGATIVE NEGATIVE Final   Influenza B by PCR NEGATIVE NEGATIVE Final    Comment: (NOTE) The Xpert Xpress SARS-CoV-2/FLU/RSV plus assay is intended as an aid in the diagnosis of influenza from Nasopharyngeal swab specimens and should not be  used as a sole basis for treatment. Nasal washings and aspirates are unacceptable for Xpert Xpress SARS-CoV-2/FLU/RSV testing.  Fact Sheet for Patients: EntrepreneurPulse.com.au  Fact Sheet for Healthcare Providers: IncredibleEmployment.be  This test is not yet approved or cleared by the [place] FDA and has been authorized for detection and/or diagnosis of SARS-CoV-2 by FDA under an Emergency Use Authorization (EUA). This EUA will remain in effect (meaning this test can be used) for the duration of the COVID-19 declaration under Section 564(b)(1) of the Act, 21 U.S.C. section 360bbb-3(b)(1), unless the authorization is terminated or revoked.  Performed at [facility], [address] [place], [place] [address]   Culture, blood (Routine X 2) w Reflex to ID Panel     Status: None (Preliminary result)   Collection Time: 09/15/21  2:26 AM   Specimen: BLOOD  Result Value Ref Range Status   Specimen Description BLOOD BLOOD RIGHT HAND  Final   Special Requests   Final    BOTTLES DRAWN AEROBIC AND ANAEROBIC Blood Culture adequate volume   Culture  Setup Time   Final    Organism ID to follow GRAM POSITIVE COCCI IN BOTH AEROBIC AND ANAEROBIC BOTTLES CRITICAL RESULT CALLED TO, READ BACK BY AND VERIFIED WITHLloyd Huger PHARMD [id number] 09/16/21 HNM Performed at [facility], [address] [place], [place] [address]    Culture GRAM POSITIVE COCCI  Final   Report Status PENDING  Incomplete  Blood Culture ID Panel (Reflexed)     Status: Abnormal   Collection Time: 09/15/21  2:26 AM  Result Value Ref Range Status   Enterococcus faecalis NOT DETECTED NOT DETECTED Final   Enterococcus Faecium NOT DETECTED NOT DETECTED Final   Listeria monocytogenes NOT DETECTED NOT DETECTED Final   Staphylococcus species NOT DETECTED NOT DETECTED Final   Staphylococcus aureus (BCID) NOT DETECTED NOT DETECTED Final   Staphylococcus epidermidis NOT  DETECTED NOT DETECTED Final   Staphylococcus lugdunensis NOT DETECTED NOT DETECTED Final   Streptococcus species DETECTED (A) NOT DETECTED Final    Comment: CRITICAL RESULT CALLED TO, READ BACK BY AND VERIFIED WITHLloyd Huger PHARMD [id number] 09/16/21 HNM    Streptococcus agalactiae DETECTED (A) NOT DETECTED Final    Comment: CRITICAL RESULT CALLED TO, READ BACK BY AND VERIFIED WITH: NATHAN BELUE PHARMD [id number] 09/16/21 HNM    Streptococcus pneumoniae NOT DETECTED NOT DETECTED Final   Streptococcus pyogenes NOT DETECTED NOT DETECTED Final   A.calcoaceticus-baumannii NOT DETECTED NOT DETECTED Final   Bacteroides fragilis NOT DETECTED NOT DETECTED Final   Enterobacterales NOT DETECTED NOT DETECTED Final   Enterobacter cloacae complex NOT DETECTED NOT DETECTED Final   Escherichia coli NOT DETECTED NOT DETECTED Final  Klebsiella aerogenes NOT DETECTED NOT DETECTED Final   Klebsiella oxytoca NOT DETECTED NOT DETECTED Final   Klebsiella pneumoniae NOT DETECTED NOT DETECTED Final   Proteus species NOT DETECTED NOT DETECTED Final   Salmonella species NOT DETECTED NOT DETECTED Final   Serratia marcescens NOT DETECTED NOT DETECTED Final   Haemophilus influenzae NOT DETECTED NOT DETECTED Final   Neisseria meningitidis NOT DETECTED NOT DETECTED Final   Pseudomonas aeruginosa NOT DETECTED NOT DETECTED Final   Stenotrophomonas maltophilia NOT DETECTED NOT DETECTED Final   Candida albicans NOT DETECTED NOT DETECTED Final   Candida auris NOT DETECTED NOT DETECTED Final   Candida glabrata NOT DETECTED NOT DETECTED Final   Candida krusei NOT DETECTED NOT DETECTED Final   Candida parapsilosis NOT DETECTED NOT DETECTED Final   Candida tropicalis NOT DETECTED NOT DETECTED Final   Cryptococcus neoformans/gattii NOT DETECTED NOT DETECTED Final    Comment: Performed at [facility], [address], [place], [place] [address]          Radiology Studies: DG Chest 2 View  Result Date:  09/14/2021 CLINICAL DATA:  Fever. EXAM: CHEST - 2 VIEW COMPARISON:  July 15, 2021 FINDINGS: Decreased lung volumes are seen with mild areas of atelectasis and/or infiltrate noted within the bilateral lung bases. There is no evidence of a pleural effusion or pneumothorax. The heart size and mediastinal contours are within normal limits. Chronic deformities are seen involving the bilateral shoulders. Degenerative changes are seen within the visualized portion of the cervical spine. Multilevel degenerative changes are noted throughout the thoracic spine. IMPRESSION: Mild bibasilar atelectasis and/or infiltrate. Electronically Signed   By: Virgina Norfolk M.D.   On: 09/14/2021 19:39   CT CHEST WO CONTRAST  Result Date: 09/14/2021 CLINICAL DATA:  Fever of unknown origin. EXAM: CT CHEST WITHOUT CONTRAST TECHNIQUE: Multidetector CT imaging of the chest was performed following the standard protocol without IV contrast. COMPARISON:  February 23, 2021 FINDINGS: Cardiovascular: There is mild calcification of the aortic arch, without evidence of aortic aneurysm. Normal heart size with moderate severity coronary artery calcification. No pericardial effusion. Mediastinum/Nodes: A 2.3 cm x 1.7 cm axillary lymph node is suspected (axial CT image 21, CT series 2). A stable, similar appearing 3.0 cm x 2.4 cm low-attenuation soft tissue mass is seen in medial to the left glenoid (axial CT image 1, CT series 2). Thyroid gland, trachea, and esophagus demonstrate no significant findings. Lungs/Pleura: Mild atelectasis is seen within the posterior aspects of the bilateral lower lobes. There is no evidence of a pleural effusion or pneumothorax. Upper Abdomen: No acute abnormality. Musculoskeletal: Marked severity degenerative changes are seen involving the right shoulder, with a large chronic appearing deformity seen involving the left humeral head and left glenoid. Multilevel degenerative changes seen throughout the thoracic spine.  IMPRESSION: 1. Mild bilateral lower lobe atelectasis. 2. Moderate severity coronary artery calcification. 3. Marked severity degenerative changes involving the right shoulder, with a large chronic appearing deformity involving the left humeral head and left glenoid. 4. Aortic atherosclerosis. Aortic Atherosclerosis (ICD10-I70.0). Electronically Signed   By: Virgina Norfolk M.D.   On: 09/14/2021 22:33   CT Hip Right Wo Contrast  Result Date: 09/14/2021 CLINICAL DATA:  Right hip pain and fever. EXAM: CT OF THE RIGHT HIP WITHOUT CONTRAST TECHNIQUE: Multidetector CT imaging of the right hip was performed according to the standard protocol. Multiplanar CT image reconstructions were also generated. COMPARISON:  None. FINDINGS: Bones/Joint/Cartilage There is no evidence of acute fracture or dislocation. Chronic fragmentation  of the right acetabulum seen with protrusio acetabula. Marked severity chronic and degenerative changes are seen involving the right hip. This is in the form of joint space narrowing, acetabular sclerosis and subchondral cyst formation. Mild flattening of the right humeral head is also seen. Ligaments Suboptimally assessed by CT. Muscles and Tendons Unremarkable Soft tissues A right hip effusion is noted. IMPRESSION: 1. No evidence of an acute fracture or dislocation. 2. Marked severity chronic and degenerative changes involving the right hip. 3. Right hip effusion. Electronically Signed   By: Virgina Norfolk M.D.   On: 09/14/2021 22:40        Scheduled Meds:  amiodarone  200 mg Oral BID   apixaban  5 mg Oral BID   aspirin EC  81 mg Oral Daily   atorvastatin  40 mg Oral QPM   brinzolamide  1 drop Both Eyes TID   And   brimonidine  1 drop Both Eyes TID   escitalopram  5 mg Oral Daily   fluticasone  2 spray Each Nare Daily   insulin aspart  0-15 Units Subcutaneous TID AC & HS   latanoprost  1 drop Both Eyes QHS   levothyroxine  25 mcg Oral Q0600   metoprolol succinate  50 mg  Oral Q breakfast   mirabegron ER  50 mg Oral Daily   Continuous Infusions:  cefTRIAXone (ROCEPHIN)  IV 2 g (09/16/21 0551)     LOS: 1 day   Time spent: 65min  Wilhelmina Hark C Aahana Elza, DO [facility]  If 7PM-7AM, please contact night-coverage [email or web address]  09/16/2021, 7:22 AM

## 2021-09-16 NOTE — Evaluation (Signed)
Physical Therapy Evaluation Patient Details Name: Becky Trevino MRN: [id number] DOB: 11/25/1945 Today's Date: 09/16/2021  History of Present Illness  76 year old female presenting from home with past medical history of paroxysmal atrial fibrillation (on Eliquis), hyperlipidemia, hypertension, history of SVT, pulmonary hypertension, chronic kidney disease stage IIIB, non-insulin-dependent diabetes mellitus type 2, obesity, obstructive sleep apnea, chronic DVT of the right lower extremity status post IVC filter placement, lymphedema, hypothyroidism who presents to [facility] emergency department with complaints of fevers and weakness.  Presents with 3 days of fever, malaise noted to have left leg cellulitis improving with IV antibiotics as below.   Clinical Impression  Pt admitted with above diagnosis. Pt received upright in bed agreeable to PT. Able to maintain conversation this morning with absence of pain meds. Does report R hip pain at rest which she has had in the past at previous admission. Reports at baseline she utilizes a hospital bed for mobility, ambulates short distances with RW but mainly relies on electric W/c for mobility. Per husband, he assists intermittently with ADL's/transfers but pt mostly able to perform on her own with equipment. Pt's shoulder elevation is significantly limited overall to < 60 degrees bilat limiting pt's use of UE's on bed rails to assist in bed mobility leading to HOB maximally elevated and mod to maxA on chuck pad/torso to get to sitting EOB. Fair static sitting balance appreciated with Bue support and with feet supported and unsupported with elevation of bed in prep for standing. Ultimately does rely on mod to maxA+2 with RN assisting to attain standing to RW with max multimodal cuing for knee/hip extension and ant trunk lean to attain upright posture with fair to poor carryover. Only tolerating ~30 sec before requesting to return to supine due to R hip pain. MaxA+2 to  return to supine and positioning in bed and RLE positioning for pt comfort. All needs in reach, spouse updated via telephone with verbal permission from pt to update. Pt well below her baseline at this time due to need of two person assist and difficulty standing without two people. PT to recommend STR upon discharge due to deficits in shoulder AROM limiting independent bed mobility with hospital bed features, LE strength, pain, and +2 physical assist in safe mobility at this time. Pt currently with functional limitations due to the deficits listed below (see PT Problem List). Pt will benefit from skilled PT to increase their independence and safety with mobility to allow discharge to the venue listed below.        Recommendations for follow up therapy are one component of a multi-disciplinary discharge planning process, led by the attending physician.  Recommendations may be updated based on patient status, additional functional criteria and insurance authorization.  Follow Up Recommendations Skilled nursing-short term rehab (<3 hours/day)    Assistance Recommended at Discharge    Functional Status Assessment Patient has had a recent decline in their functional status and demonstrates the ability to make significant improvements in function in a reasonable and predictable amount of time.  Equipment Recommendations  None recommended by PT    Recommendations for Other Services OT consult     Precautions / Restrictions Precautions Precautions: Fall Restrictions Weight Bearing Restrictions: No      Mobility  Bed Mobility Overal bed mobility: Needs Assistance Bed Mobility: Supine to Sit;Sit to Supine     Supine to sit: Mod assist;Max assist;HOB elevated Sit to supine: Max assist   General bed mobility comments: Requiring intermittent mod to maxA+1 to  attain sitting EOB. Very limited shoulder mobility limiting pt's ability to utilize bed rails. States bed rails on her personal hospital bed  are more accessible for her at home. Patient Response: Cooperative;Flat affect  Transfers Overall transfer level: Needs assistance Equipment used: Rolling walker (2 wheels) Transfers: Sit to/from Stand Sit to Stand: From elevated surface;Mod assist;+2 physical assistance           General transfer comment: Requiring bed significantly elevated, 2 person assist to attain standing. LImited by pain to ~30 sec at EOB.    Ambulation/Gait               General Gait Details: Unable due to pain  Stairs            Wheelchair Mobility    Modified Rankin (Stroke Patients Only)       Balance Overall balance assessment: Needs assistance Sitting-balance support: Bilateral upper extremity supported;Feet unsupported;Feet supported Sitting balance-Leahy Scale: Fair Sitting balance - Comments: able to maintain static sititng with UE support and close supervision   Standing balance support: Bilateral upper extremity supported;Reliant on assistive device for balance Standing balance-Leahy Scale: Poor Standing balance comment: Requires CGA througohut and cuing on pelvis to shifht weight anteriorly over BOS.                             Pertinent Vitals/Pain Pain Assessment: Faces Faces Pain Scale: Hurts even more Pain Location: R hip Pain Descriptors / Indicators: Discomfort;Grimacing Pain Intervention(s): Limited activity within patient's tolerance;Monitored during session;Repositioned    Home Living Family/patient expects to be discharged to:: Private residence Living Arrangements: Spouse/significant other Available Help at Discharge: Family;Available 24 hours/day Type of Home: Apartment Home Access: Level entry       Home Layout: One level Home Equipment: Conservation officer, nature (2 wheels);Hospital bed (hover round electric w/c)      Prior Function Prior Level of Function : Needs assist       Physical Assist : Mobility (physical);ADLs (physical)      Mobility Comments: Husband assists with bed mobility, toileting on occasion. Relies on hospital bed to stand from elevated surfaces, utilizes electric w/c for most mobility. Amb with RW very short distances.       Hand Dominance        Extremity/Trunk Assessment   Upper Extremity Assessment Upper Extremity Assessment: Generalized weakness;RUE deficits/detail;LUE deficits/detail RUE Deficits / Details: Limited shoulder elevation < 60 degrees LUE Deficits / Details: Limited shoulder elevation < 60 degrees            Communication   Communication: No difficulties  Cognition Arousal/Alertness: Awake/alert Behavior During Therapy: WFL for tasks assessed/performed Overall Cognitive Status: Within Functional Limits for tasks assessed                                          General Comments      Exercises Other Exercises Other Exercises: Role of PT in acute setting, D/c recs   Assessment/Plan    PT Assessment Patient needs continued PT services  PT Problem List Decreased strength;Pain;Decreased activity tolerance;Decreased balance;Decreased mobility;Decreased safety awareness       PT Treatment Interventions DME instruction;Therapeutic exercise;Gait training;Balance training;Neuromuscular re-education;Functional mobility training;Therapeutic activities;Patient/family education    PT Goals (Current goals can be found in the Care Plan section)  Acute Rehab PT Goals Patient Stated Goal: improve pain  and go home PT Goal Formulation: With patient Time For Goal Achievement: 09/30/21 Potential to Achieve Goals: Fair    Frequency Min 2X/week   Barriers to discharge   Family providing needed physical assist for OOB mobility.    Co-evaluation               AM-PAC PT "6 Clicks" Mobility  Outcome Measure Help needed turning from your back to your side while in a flat bed without using bedrails?: A Lot Help needed moving from lying on your back to  sitting on the side of a flat bed without using bedrails?: Total Help needed moving to and from a bed to a chair (including a wheelchair)?: Total Help needed standing up from a chair using your arms (e.g., wheelchair or bedside chair)?: A Lot Help needed to walk in hospital room?: Total Help needed climbing 3-5 steps with a railing? : Total 6 Click Score: 8    End of Session Equipment Utilized During Treatment: Gait belt Activity Tolerance: Patient limited by pain Patient left: in bed;with bed alarm set;with nursing/sitter in room Nurse Communication: Mobility status PT Visit Diagnosis: Other abnormalities of gait and mobility (R26.89);Muscle weakness (generalized) (M62.81);Pain;Difficulty in walking, not elsewhere classified (R26.2) Pain - Right/Left: Left Pain - part of body: Hip    Time: 8811-0315 PT Time Calculation (min) (ACUTE ONLY): 21 min   Charges:   PT Evaluation $PT Eval Low Complexity: Florien M. Fairly IV, PT, DPT Physical Therapist- [facility]  09/16/2021, 10:37 AM

## 2021-09-17 DIAGNOSIS — R7881 Bacteremia: Secondary | ICD-10-CM | POA: Diagnosis not present

## 2021-09-17 DIAGNOSIS — B951 Streptococcus, group B, as the cause of diseases classified elsewhere: Secondary | ICD-10-CM | POA: Diagnosis not present

## 2021-09-17 LAB — CBC
HCT: 31.6 % — ABNORMAL LOW (ref 36.0–46.0)
Hemoglobin: 10.5 g/dL — ABNORMAL LOW (ref 12.0–15.0)
MCH: 31.9 pg (ref 26.0–34.0)
MCHC: 33.2 g/dL (ref 30.0–36.0)
MCV: 96 fL (ref 80.0–100.0)
Platelets: 145 10*3/uL — ABNORMAL LOW (ref 150–400)
RBC: 3.29 MIL/uL — ABNORMAL LOW (ref 3.87–5.11)
RDW: 18 % — ABNORMAL HIGH (ref 11.5–15.5)
WBC: 8.3 10*3/uL (ref 4.0–10.5)
nRBC: 0 % (ref 0.0–0.2)

## 2021-09-17 LAB — BASIC METABOLIC PANEL
Anion gap: 8 (ref 5–15)
BUN: 23 mg/dL (ref 8–23)
CO2: 23 mmol/L (ref 22–32)
Calcium: 8.5 mg/dL — ABNORMAL LOW (ref 8.9–10.3)
Chloride: 103 mmol/L (ref 98–111)
Creatinine, Ser: 1.28 mg/dL — ABNORMAL HIGH (ref 0.44–1.00)
GFR, Estimated: 44 mL/min — ABNORMAL LOW (ref >60)
Glucose, Bld: 102 mg/dL — ABNORMAL HIGH (ref 70–99)
Potassium: 3.6 mmol/L (ref 3.5–5.1)
Sodium: 134 mmol/L — ABNORMAL LOW (ref 135–145)

## 2021-09-17 LAB — GLUCOSE, CAPILLARY
Glucose-Capillary: 114 mg/dL — ABNORMAL HIGH (ref 70–99)
Glucose-Capillary: 110 mg/dL — ABNORMAL HIGH (ref 70–99)
Glucose-Capillary: 122 mg/dL — ABNORMAL HIGH (ref 70–99)
Glucose-Capillary: 135 mg/dL — ABNORMAL HIGH (ref 70–99)
Glucose-Capillary: 128 mg/dL — ABNORMAL HIGH (ref 70–99)

## 2021-09-17 LAB — HEMOGLOBIN A1C
Hgb A1c MFr Bld: 5.4 % (ref 4.8–5.6)
Mean Plasma Glucose: 108.28 mg/dL

## 2021-09-17 NOTE — Telephone Encounter (Signed)
Left message for patient to return call or report to her nearest urgent care or emergency room if persistent fever or any new or worsening symptoms have risen.

## 2021-09-17 NOTE — Plan of Care (Signed)

## 2021-09-17 NOTE — Consult Note (Addendum)
NAME: Becky Trevino  DOB: [birth year]  MRN: [id number]  Date/Time: 09/17/2021 2:11 PM  REQUESTING PROVIDER: Dr.Lancaster Subjective:  REASON FOR CONSULT: GBS bacteremia ? DORENA Trevino is a 76 y.o. 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, recurrent Group B strep bacteremia intially from the wound ORIF diabetes mellitus, hyperlipidemia, hypertension, degenerative disc , left gluteal pelvic hematoma after a fall in Nov 2020, cervical decompression surgery march 2015, DVT rt lower extremity , has IVC filter-  lymphedema legs Presents with inability to bear weight on the left leg Severe pain rt hip and fever Pt was recently in [facility] in NOV 2022 for unexplained sepsis and had aspiration of the rt hip that intially was reported as 73K but later changed to 1382 with 33% N Both blood and synovial fluid culture was negative -  After a few days of IV she was discharge don no antibiotics She says she was doing fine until last week when she had a fever on Tuesday and got it under control with tylenol. Then the rt hip started hurting more than baseline And she couldn't walk and came to the ED on 12/31 Vitals BP 128/57, TEMP 99.4, HR 82 , sats 95% Wbc 8.6, cr 1.51, HB 12, PLT 137 Blood culture sent and came back as GBS CT hip Marked severity chronic and degenerative changes involving the right hip. Right hip effusion.  Patient has a complicated Infection history   Hospitalization 09/02/20-09/14/20 for left upper thigh cellulitis, MRSA bacteremia and group B strep bacteremia and left lower lobe infiltrate due to MRSA She got IV vanco followed by Daptomycin for a total of 22 days   Feb 2021- Group B strep bacteremia Rt ankle fracture ORIF Dec 2020-  non healing wound with GBS and acinetobacter in Feb 2021- treated with 4 weeks of Iv antibiotic- unasyn -11/25/19   Was in the hospital between 08/07/19-08/09/19 after  she passed out at  work and hit a metal pole She developed a left gluteal hematoma and 295 ml of dark purple blood aspirated by ortho on Dec , 11 2020 - no culture   In 2017 had rt knee replacement   In February 2019 she had swelling of the knee and an aspiration revealed 59,000 WBCs with 98% polymorphs.  Cultures were negative as well as for crystal analysis.  Sed rate was 76.  On March 7 , 2019 she had an incision and drainage and polyethylene exchange of the right knee.  The cultures grew coag negative staph.  PICC line was placed and she was given vancomycin and ceftriaxone for 6 weeks,  , culture had coag neg staph, was on suppressive doxy + rifampin because of retained prosthesis. Since  oct 2019  has been on Doxy suppressive therapy  Past Medical History:  Diagnosis Date   Cervical spondylosis    Chronic kidney disease    Stage 3   DDD (degenerative disc disease), cervical    [facility]   Diabetes mellitus without complication (HCC)    Diverticulosis    Hyperlipidemia    Hypertension    Intertrigo    Leg cramps    Leg varices    Obesity    OSA (obstructive sleep apnea)    Symptomatic menopausal or female climacteric states    Syncope     Past Surgical History:  Procedure Laterality Date   ABDOMINAL HYSTERECTOMY  1993   Total  BUNIONECTOMY Bilateral 1993   I & D KNEE WITH POLY EXCHANGE Right 11/19/2017   Procedure: RIGHT KNEE POLY EXCHANGE WITH IRRIGATION AND DEBRIDEMENT;  Surgeon: Hessie Knows, MD;  Location: [facility];  Service: Orthopedics;  Laterality: Right;   JOINT REPLACEMENT     bilateral knee   LAMINECTOMY  11/14/2013    Cervical Fusion , [facility], Dr. Delilah Shan   LASER ABLATION Bilateral 07/29/2012   Dr. Lucky Cowboy   LOWER EXTREMITY ANGIOGRAPHY Right 11/02/2019   Procedure: Lower Extremity Angiography;  Surgeon: Katha Cabal, MD;  Location: [facility];  Service: Cardiovascular;  Laterality: Right;   ORIF ANKLE FRACTURE Right 09/02/2019   Procedure: OPEN REDUCTION INTERNAL  FIXATION (ORIF) ANKLE FRACTURE BIMALLEOLAR;  Surgeon: [name], DPM;  Location: [facility];  Service: Podiatry;  Laterality: Right;   PERIPHERAL VASCULAR THROMBECTOMY Right 03/02/2020   Procedure: PERIPHERAL VASCULAR THROMBECTOMY;  Surgeon: Algernon Huxley, MD;  Location: [facility];  Service: Cardiovascular;  Laterality: Right;   REPLACEMENT TOTAL KNEE Left 03/2010   Dr. Rudene Christians   TOTAL HIP ARTHROPLASTY Left 12/11/2015   Procedure: TOTAL HIP ARTHROPLASTY ANTERIOR APPROACH;  Surgeon: Hessie Knows, MD;  Location: [facility];  Service: Orthopedics;  Laterality: Left;   TOTAL KNEE ARTHROPLASTY Right 03/11/2016   Procedure: TOTAL KNEE ARTHROPLASTY;  Surgeon: Hessie Knows, MD;  Location: [facility];  Service: Orthopedics;  Laterality: Right;    Social History   Socioeconomic History   Marital status: Married    Spouse name: Not on file   Number of children: 1   Years of education: Not on file   Highest education level: Not on file  Occupational History   Not on file  Tobacco Use   Smoking status: Never   Smokeless tobacco: Never  Vaping Use   Vaping Use: Never used  Substance and Sexual Activity   Alcohol use: No    Alcohol/week: 0.0 standard drinks   Drug use: No   Sexual activity: Yes    Partners: Male  Other Topics Concern   Not on file  Social History Narrative   Married, one adopted grown child    Social Determinants of Radio broadcast assistant Strain: Low Risk    Difficulty of Paying Living Expenses: Not hard at all  Food Insecurity: No Food Insecurity   Worried About Charity fundraiser in the Last Year: Never true   Arboriculturist in the Last Year: Never true  Transportation Needs: No Transportation Needs   Lack of Transportation (Medical): No   Lack of Transportation (Non-Medical): No  Physical Activity: Insufficiently Active   Days of Exercise per Week: 2 days   Minutes of Exercise per Session: 30 min  Stress: No Stress Concern Present   Feeling of Stress :  Only a little  Social Connections: Engineer, building services of Communication with Friends and Family: More than three times a week   Frequency of Social Gatherings with Friends and Family: More than three times a week   Attends Religious Services: More than 4 times per year   Active Member of [employer] or Organizations: Yes   Attends Music therapist: More than 4 times per year   Marital Status: Married  Human resources officer Violence: Not At Risk   Fear of Current or Ex-Partner: No   Emotionally Abused: No   Physically Abused: No   Sexually Abused: No    Family History  Problem Relation Age of Onset   Diabetes  Mother    Hyperlipidemia Mother    Hypertension Mother    Diabetes Father    Hyperlipidemia Father    Hypertension Father    Obesity Father    Hypertension Sister    Hyperlipidemia Sister    Hyperlipidemia Sister    Breast cancer Neg Hx    Allergies  Allergen Reactions   Lisinopril Swelling   I? Current Facility-Administered Medications  Medication Dose Route Frequency Provider Last Rate Last Admin   acetaminophen (TYLENOL) tablet 650 mg  650 mg Oral Q6H PRN Vernelle Emerald, MD   650 mg at 09/16/21 0913   Or   acetaminophen (TYLENOL) suppository 650 mg  650 mg Rectal Q6H PRN Vernelle Emerald, MD       amiodarone (PACERONE) tablet 200 mg  200 mg Oral BID Vernelle Emerald, MD   200 mg at 09/17/21 7829   apixaban (ELIQUIS) tablet 5 mg  5 mg Oral BID Vernelle Emerald, MD   5 mg at 09/17/21 5621   aspirin EC tablet 81 mg  81 mg Oral Daily Vernelle Emerald, MD   81 mg at 09/17/21 0909   atorvastatin (LIPITOR) tablet 40 mg  40 mg Oral QPM Shalhoub, Sherryll Burger, MD   40 mg at 09/16/21 1757   brinzolamide (AZOPT) 1 % ophthalmic suspension 1 drop  1 drop Both Eyes TID Vernelle Emerald, MD   1 drop at 09/17/21 0909   And   brimonidine (ALPHAGAN) 0.2 % ophthalmic solution 1 drop  1 drop Both Eyes TID Vernelle Emerald, MD   1 drop at 09/17/21 0909    cefTRIAXone (ROCEPHIN) 2 g in sodium chloride 0.9 % 100 mL IVPB  2 g Intravenous Q0600 Vernelle Emerald, MD 200 mL/hr at 09/17/21 0917 2 g at 09/17/21 0917   escitalopram (LEXAPRO) tablet 5 mg  5 mg Oral Daily Shalhoub, Sherryll Burger, MD   5 mg at 09/17/21 0908   fluticasone (FLONASE) 50 MCG/ACT nasal spray 2 spray  2 spray Each Nare Daily Shalhoub, Sherryll Burger, MD   2 spray at 09/17/21 0909   hydrALAZINE (APRESOLINE) injection 10 mg  10 mg Intravenous Q6H PRN Shalhoub, Sherryll Burger, MD       insulin aspart (novoLOG) injection 0-15 Units  0-15 Units Subcutaneous TID AC & HS Shalhoub, Sherryll Burger, MD   2 Units at 09/15/21 2211   latanoprost (XALATAN) 0.005 % ophthalmic solution 1 drop  1 drop Both Eyes QHS Shalhoub, Sherryll Burger, MD   1 drop at 09/16/21 2123   levothyroxine (SYNTHROID) tablet 25 mcg  25 mcg Oral Q0600 Vernelle Emerald, MD   25 mcg at 09/17/21 0604   metoprolol succinate (TOPROL-XL) 24 hr tablet 50 mg  50 mg Oral Q breakfast Vernelle Emerald, MD   50 mg at 09/15/21 0834   mirabegron ER (MYRBETRIQ) tablet 50 mg  50 mg Oral Daily Vernelle Emerald, MD   50 mg at 09/17/21 0908   ondansetron (ZOFRAN) tablet 4 mg  4 mg Oral Q6H PRN Vernelle Emerald, MD       Or   ondansetron [facility] injection 4 mg  4 mg Intravenous Q6H PRN Shalhoub, Sherryll Burger, MD       oxyCODONE (Oxy IR/ROXICODONE) immediate release tablet 10 mg  10 mg Oral Q6H PRN Little Ishikawa, MD   10 mg at 09/16/21 2141   polyethylene glycol (MIRALAX / GLYCOLAX) packet 17 g  17 g Oral Daily PRN Inda Merlin  J, MD       traZODone (DESYREL) tablet 25-50 mg  25-50 mg Oral QHS PRN Shalhoub, Sherryll Burger, MD   50 mg at 09/16/21 2123     Abtx:  Anti-infectives (From admission, onward)    Start     Dose/Rate Route Frequency Ordered Stop   09/15/21 0300  cefTRIAXone (ROCEPHIN) 2 g in sodium chloride 0.9 % 100 mL IVPB        2 g 200 mL/hr over 30 Minutes Intravenous Daily 09/15/21 0152 09/22/21 0559       REVIEW OF SYSTEMS:  Const:   fever, negative chills, negative weight loss Eyes: negative diplopia or visual changes, negative eye pain ENT: negative coryza, negative sore throat Resp: negative cough, hemoptysis, dyspnea Cards: negative for chest pain, palpitations, lower extremity edema GU: negative for frequency, dysuria and hematuria GI: Negative for abdominal pain, diarrhea, bleeding, constipation Skin: negative for rash and pruritus Heme: negative for easy bruising and gum/nose bleeding MS: , arthralgias,  muscle weakness Neurolo:negative for headaches, dizziness, vertigo, memory problems  Psych: negative for feelings of anxiety, depression  Endocrine:  diabetes/hypothyroidism Allergy/Immunology- negative for any medication or food allergies ? Pivots to the bedside commode Objective:  VITALS:  BP (!) 104/53 (BP Location: Left Arm)    Pulse 96    Temp 99.4 F (37.4 C)    Resp 18    Ht 5\' 11"  (1.803 m)    Wt 136.1 kg    SpO2 92%    BMI 41.84 kg/m  PHYSICAL EXAM:  General: Alert, cooperative, morbid obesity Head: Normocephalic, without obvious abnormality, atraumatic. Eyes: Conjunctivae clear, anicteric sclerae. Pupils are equal ENT Nares normal. No drainage or sinus tenderness. Lips, mucosa, and tongue normal. No Thrush Neck: Supple, symmetrical, no adenopathy, thyroid: non tender no carotid bruit and no JVD. Back: No CVA tenderness. Lungs: Clear to auscultation bilaterally. No Wheezing or Rhonchi. No rales. Heart: Regular rate and rhythm, no murmur, rub or gallop. Abdomen: Soft, non-tender,not distended. Bowel sounds normal. No masses Extremities: rt knee sugical scar- some swelling but movt okay Rt ankle- no wound Rt hip restricted movt Left knee scar- healthy- knee not swollen Left hip scar Skin: No rashes or lesions. Or bruising Lymph: Cervical, supraclavicular normal. Neurologic: Grossly non-focal Pertinent Labs Lab Results CBC    Component Value Date/Time   WBC 8.3 09/17/2021 0505   RBC  3.29 (L) 09/17/2021 0505   HGB 10.5 (L) 09/17/2021 0505   HGB 13.2 11/26/2015 1122   HCT 31.6 (L) 09/17/2021 0505   HCT 40.9 11/26/2015 1122   PLT 145 (L) 09/17/2021 0505   PLT 224 11/26/2015 1122   MCV 96.0 09/17/2021 0505   MCV 96 11/26/2015 1122   MCV 96 10/18/2012 1716   MCH 31.9 09/17/2021 0505   MCHC 33.2 09/17/2021 0505   RDW 18.0 (H) 09/17/2021 0505   RDW 15.1 11/26/2015 1122   RDW 15.2 (H) 10/18/2012 1716   LYMPHSABS 0.6 (L) 09/15/2021 0533   LYMPHSABS 2.3 11/26/2015 1122   MONOABS 0.7 09/15/2021 0533   EOSABS 0.0 09/15/2021 0533   EOSABS 0.3 11/26/2015 1122   BASOSABS 0.0 09/15/2021 0533   BASOSABS 0.0 11/26/2015 1122    CMP Latest Ref Rng & Units 09/17/2021 09/16/2021 09/15/2021  Glucose 70 - 99 mg/dL 102(H) 84 100(H)  BUN 8 - 23 mg/dL 23 23 22   Creatinine 0.44 - 1.00 mg/dL 1.28(H) 1.39(H) 1.31(H)  Sodium 135 - 145 mmol/L 134(L) 135 132(L)  Potassium 3.5 - 5.1 mmol/L 3.6  4.1 3.7  Chloride 98 - 111 mmol/L 103 103 101  CO2 22 - 32 mmol/L 23 24 22   Calcium 8.9 - 10.3 mg/dL 8.5(L) 8.7(L) 8.8(L)  Total Protein 6.5 - 8.1 g/dL - - 7.5  Total Bilirubin 0.3 - 1.2 mg/dL - - 1.1  Alkaline Phos 38 - 126 U/L - - 60  AST 15 - 41 U/L - - 21  ALT 0 - 44 U/L - - 18      Microbiology: Recent Results (from the past 240 hour(s))  Resp Panel by RT-PCR (Flu A&B, Covid) Nasopharyngeal Swab     Status: None   Collection Time: 09/14/21  4:57 PM   Specimen: Nasopharyngeal Swab; Nasopharyngeal(NP) swabs in vial transport medium  Result Value Ref Range Status   SARS Coronavirus 2 by RT PCR NEGATIVE NEGATIVE Final    Comment: (NOTE) SARS-CoV-2 target nucleic acids are NOT DETECTED.  The SARS-CoV-2 RNA is generally detectable in upper respiratory specimens during the acute phase of infection. The lowest concentration of SARS-CoV-2 viral copies this assay can detect is 138 copies/mL. A negative result does not preclude SARS-Cov-2 infection and should not be used as the sole basis for  treatment or other patient management decisions. A negative result may occur with  improper specimen collection/handling, submission of specimen other than nasopharyngeal swab, presence of viral mutation(s) within the areas targeted by this assay, and inadequate number of viral copies(<138 copies/mL). A negative result must be combined with clinical observations, patient history, and epidemiological information. The expected result is Negative.  Fact Sheet for Patients:  EntrepreneurPulse.com.au  Fact Sheet for Healthcare Providers:  IncredibleEmployment.be  This test is no t yet approved or cleared by the [place] FDA and  has been authorized for detection and/or diagnosis of SARS-CoV-2 by FDA under an Emergency Use Authorization (EUA). This EUA will remain  in effect (meaning this test can be used) for the duration of the COVID-19 declaration under Section 564(b)(1) of the Act, 21 U.S.C.section 360bbb-3(b)(1), unless the authorization is terminated  or revoked sooner.       Influenza A by PCR NEGATIVE NEGATIVE Final   Influenza B by PCR NEGATIVE NEGATIVE Final    Comment: (NOTE) The Xpert Xpress SARS-CoV-2/FLU/RSV plus assay is intended as an aid in the diagnosis of influenza from Nasopharyngeal swab specimens and should not be used as a sole basis for treatment. Nasal washings and aspirates are unacceptable for Xpert Xpress SARS-CoV-2/FLU/RSV testing.  Fact Sheet for Patients: EntrepreneurPulse.com.au  Fact Sheet for Healthcare Providers: IncredibleEmployment.be  This test is not yet approved or cleared by the [place] FDA and has been authorized for detection and/or diagnosis of SARS-CoV-2 by FDA under an Emergency Use Authorization (EUA). This EUA will remain in effect (meaning this test can be used) for the duration of the COVID-19 declaration under Section 564(b)(1) of the Act, 21  U.S.C. section 360bbb-3(b)(1), unless the authorization is terminated or revoked.  Performed at [facility], [address] [place], [place] [address]   Culture, blood (Routine X 2) w Reflex to ID Panel     Status: Abnormal (Preliminary result)   Collection Time: 09/14/21 11:15 PM   Specimen: BLOOD  Result Value Ref Range Status   Specimen Description   Final    BLOOD RIGHT ANTECUBITAL Performed at [facility], [address] [place],  [address]    Special Requests   Final    BOTTLES DRAWN AEROBIC AND ANAEROBIC Blood Culture results may not be optimal  due to an excessive volume of blood received in culture bottles Performed at [facility], [address] [place], [place] [address]    Culture  Setup Time   Final    GRAM POSITIVE COCCI IN BOTH AEROBIC AND ANAEROBIC BOTTLES CRITICAL VALUE NOTED.  VALUE IS CONSISTENT WITH PREVIOUSLY REPORTED AND CALLED VALUE. Performed at [facility], [address] [place], [place] [address]    Culture (A)  Final    STREPTOCOCCUS AGALACTIAE STAPHYLOCOCCUS CAPITIS THE SIGNIFICANCE OF ISOLATING THIS ORGANISM FROM A SINGLE SET OF BLOOD CULTURES WHEN MULTIPLE SETS ARE DRAWN IS UNCERTAIN. PLEASE NOTIFY THE MICROBIOLOGY DEPARTMENT WITHIN ONE WEEK IF SPECIATION AND SENSITIVITIES ARE REQUIRED. Performed at [facility], [place] [address] [place], [place] [address]    Report Status PENDING  Incomplete  Resp Panel by RT-PCR (Flu A&B, Covid) Nasopharyngeal Swab     Status: None   Collection Time: 09/14/21 11:15 PM   Specimen: Nasopharyngeal Swab; Nasopharyngeal(NP) swabs in vial transport medium  Result Value Ref Range Status   SARS Coronavirus 2 by RT PCR NEGATIVE NEGATIVE Final    Comment: (NOTE) SARS-CoV-2 target nucleic acids are NOT DETECTED.  The SARS-CoV-2 RNA is generally detectable in upper respiratory specimens during the acute phase of infection. The lowest concentration of SARS-CoV-2  viral copies this assay can detect is 138 copies/mL. A negative result does not preclude SARS-Cov-2 infection and should not be used as the sole basis for treatment or other patient management decisions. A negative result may occur with  improper specimen collection/handling, submission of specimen other than nasopharyngeal swab, presence of viral mutation(s) within the areas targeted by this assay, and inadequate number of viral copies(<138 copies/mL). A negative result must be combined with clinical observations, patient history, and epidemiological information. The expected result is Negative.  Fact Sheet for Patients:  EntrepreneurPulse.com.au  Fact Sheet for Healthcare Providers:  IncredibleEmployment.be  This test is no t yet approved or cleared by the [place] FDA and  has been authorized for detection and/or diagnosis of SARS-CoV-2 by FDA under an Emergency Use Authorization (EUA). This EUA will remain  in effect (meaning this test can be used) for the duration of the COVID-19 declaration under Section 564(b)(1) of the Act, 21 U.S.C.section 360bbb-3(b)(1), unless the authorization is terminated  or revoked sooner.       Influenza A by PCR NEGATIVE NEGATIVE Final   Influenza B by PCR NEGATIVE NEGATIVE Final    Comment: (NOTE) The Xpert Xpress SARS-CoV-2/FLU/RSV plus assay is intended as an aid in the diagnosis of influenza from Nasopharyngeal swab specimens and should not be used as a sole basis for treatment. Nasal washings and aspirates are unacceptable for Xpert Xpress SARS-CoV-2/FLU/RSV testing.  Fact Sheet for Patients: EntrepreneurPulse.com.au  Fact Sheet for Healthcare Providers: IncredibleEmployment.be  This test is not yet approved or cleared by the [place] FDA and has been authorized for detection and/or diagnosis of SARS-CoV-2 by FDA under an Emergency Use Authorization  (EUA). This EUA will remain in effect (meaning this test can be used) for the duration of the COVID-19 declaration under Section 564(b)(1) of the Act, 21 U.S.C. section 360bbb-3(b)(1), unless the authorization is terminated or revoked.  Performed at [facility], [address] [place],  [address]   Culture, blood (Routine X 2) w Reflex to ID Panel     Status: Abnormal (Preliminary result)   Collection Time: 09/15/21  2:26 AM   Specimen: BLOOD  Result Value Ref Range  Status   Specimen Description   Final    BLOOD BLOOD RIGHT HAND Performed at [facility], [address] [place], [place] [address]    Special Requests   Final    BOTTLES DRAWN AEROBIC AND ANAEROBIC Blood Culture adequate volume Performed at [facility], [address] [place], [place] [address]    Culture  Setup Time   Final    Organism ID to follow GRAM POSITIVE COCCI IN BOTH AEROBIC AND ANAEROBIC BOTTLES CRITICAL RESULT CALLED TO, READ BACK BY AND VERIFIED WITHLloyd Huger [facility] 4008 09/16/21 HNM Performed at [facility], [address], [place], [place] [address]    Culture (A)  Final    GROUP B STREP(S.AGALACTIAE)ISOLATED SUSCEPTIBILITIES TO FOLLOW Performed at [facility], [place] [address], [place], [place] [address]    Report Status PENDING  Incomplete  Blood Culture ID Panel (Reflexed)     Status: Abnormal   Collection Time: 09/15/21  2:26 AM  Result Value Ref Range Status   Enterococcus faecalis NOT DETECTED NOT DETECTED Final   Enterococcus Faecium NOT DETECTED NOT DETECTED Final   Listeria monocytogenes NOT DETECTED NOT DETECTED Final   Staphylococcus species NOT DETECTED NOT DETECTED Final   Staphylococcus aureus (BCID) NOT DETECTED NOT DETECTED Final   Staphylococcus epidermidis NOT DETECTED NOT DETECTED Final   Staphylococcus lugdunensis NOT DETECTED NOT DETECTED Final   Streptococcus species DETECTED (A) NOT DETECTED Final    Comment:  CRITICAL RESULT CALLED TO, READ BACK BY AND VERIFIED WITH: NATHAN BELUE PHARMD [id number] 09/16/21 HNM    Streptococcus agalactiae DETECTED (A) NOT DETECTED Final    Comment: CRITICAL RESULT CALLED TO, READ BACK BY AND VERIFIED WITH: NATHAN BELUE PHARMD [id number] 09/16/21 HNM    Streptococcus pneumoniae NOT DETECTED NOT DETECTED Final   Streptococcus pyogenes NOT DETECTED NOT DETECTED Final   A.calcoaceticus-baumannii NOT DETECTED NOT DETECTED Final   Bacteroides fragilis NOT DETECTED NOT DETECTED Final   Enterobacterales NOT DETECTED NOT DETECTED Final   Enterobacter cloacae complex NOT DETECTED NOT DETECTED Final   Escherichia coli NOT DETECTED NOT DETECTED Final   Klebsiella aerogenes NOT DETECTED NOT DETECTED Final   Klebsiella oxytoca NOT DETECTED NOT DETECTED Final   Klebsiella pneumoniae NOT DETECTED NOT DETECTED Final   Proteus species NOT DETECTED NOT DETECTED Final   Salmonella species NOT DETECTED NOT DETECTED Final   Serratia marcescens NOT DETECTED NOT DETECTED Final   Haemophilus influenzae NOT DETECTED NOT DETECTED Final   Neisseria meningitidis NOT DETECTED NOT DETECTED Final   Pseudomonas aeruginosa NOT DETECTED NOT DETECTED Final   Stenotrophomonas maltophilia NOT DETECTED NOT DETECTED Final   Candida albicans NOT DETECTED NOT DETECTED Final   Candida auris NOT DETECTED NOT DETECTED Final   Candida glabrata NOT DETECTED NOT DETECTED Final   Candida krusei NOT DETECTED NOT DETECTED Final   Candida parapsilosis NOT DETECTED NOT DETECTED Final   Candida tropicalis NOT DETECTED NOT DETECTED Final   Cryptococcus neoformans/gattii NOT DETECTED NOT DETECTED Final    Comment: Performed at [facility], [address] [place],  [address]    IMAGING RESULTS: CT hip rt reviewed Marked severity chronic and degenerative changes involving the right hip.  Right hip effusion. I have personally reviewed the films ? 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 ? ___________________________________________________ Discussed with patient, husband,requesting provider and Dr.Menz Note:  This document was prepared using Dragon voice recognition software and may include unintentional dictation errors.

## 2021-09-17 NOTE — Progress Notes (Signed)
Physical Therapy Treatment Patient Details Name: Becky Trevino MRN: [id number] DOB: 1946/07/30 Today's Date: 09/17/2021   History of Present Illness 76 year old female presenting from home with past medical history of paroxysmal atrial fibrillation (on Eliquis), hyperlipidemia, hypertension, history of SVT, pulmonary hypertension, chronic kidney disease stage IIIB, non-insulin-dependent diabetes mellitus type 2, obesity, obstructive sleep apnea, chronic DVT of the right lower extremity status post IVC filter placement, lymphedema, hypothyroidism who presents to [facility] emergency department with complaints of fevers and weakness.  Presents with 3 days of fever, malaise noted to have left leg cellulitis improving with IV antibiotics as below.    PT Comments    Pt seen for modified session due to c/o 9/10 Right hip/knee pain. Pt resting in bed, refuses mobility, husband at bedside. Pt participated in B LE AA/AROM (to tolerance on R). Pt agreed to increased activity next session once antibiotics have had time to begin working.  Continue PT per POC.   Recommendations for follow up therapy are one component of a multi-disciplinary discharge planning process, led by the attending physician.  Recommendations may be updated based on patient status, additional functional criteria and insurance authorization.  Follow Up Recommendations  Skilled nursing-short term rehab (<3 hours/day)     Assistance Recommended at Discharge Frequent or constant Supervision/Assistance  Patient can return home with the following A lot of help with walking and/or transfers;A lot of help with bathing/dressing/bathroom;Assist for transportation   Equipment Recommendations  None recommended by PT    Recommendations for Other Services       Precautions / Restrictions Precautions Precautions: Fall Restrictions Weight Bearing Restrictions: No     Mobility  Bed Mobility               General bed mobility comments:  Pt declined due to pain    Transfers                        Ambulation/Gait                   Stairs             Wheelchair Mobility    Modified Rankin (Stroke Patients Only)       Balance                                            Cognition Arousal/Alertness: Awake/alert Behavior During Therapy: WFL for tasks assessed/performed Overall Cognitive Status: Within Functional Limits for tasks assessed                                 General Comments: A&Ox4.        Exercises General Exercises - Lower Extremity Ankle Circles/Pumps: AROM;Both;15 reps Quad Sets: AROM;Both;10 reps Gluteal Sets: AROM;Both;10 reps Heel Slides: AAROM;Left;10 reps Hip ABduction/ADduction: AAROM;Left;10 reps Other Exercises Other Exercises: R hip AAROM IR/ER x 10 reps    General Comments General comments (skin integrity, edema, etc.):  (Pt repositioned to prevent skin breakdown and facilitate circulation)      Pertinent Vitals/Pain Pain Assessment: 0-10 Pain Score: 9  Breathing: normal Pain Location: R hip and knee Pain Descriptors / Indicators: Discomfort;Grimacing Pain Intervention(s): Monitored during session;Limited activity within patient's tolerance    Home Living  Prior Function            PT Goals (current goals can now be found in the care plan section) Acute Rehab PT Goals Patient Stated Goal: improve pain and go home    Frequency    Min 2X/week      PT Plan      Co-evaluation              AM-PAC PT "6 Clicks" Mobility   Outcome Measure  Help needed turning from your back to your side while in a flat bed without using bedrails?: A Lot Help needed moving from lying on your back to sitting on the side of a flat bed without using bedrails?: Total Help needed moving to and from a bed to a chair (including a wheelchair)?: Total Help needed standing up from a chair  using your arms (e.g., wheelchair or bedside chair)?: A Lot Help needed to walk in hospital room?: Total Help needed climbing 3-5 steps with a railing? : Total 6 Click Score: 8    End of Session   Activity Tolerance: Patient limited by pain Patient left: in bed;with call bell/phone within reach;with bed alarm set;with family/visitor present Nurse Communication: Mobility status PT Visit Diagnosis: Other abnormalities of gait and mobility (R26.89);Muscle weakness (generalized) (M62.81);Pain;Difficulty in walking, not elsewhere classified (R26.2) Pain - Right/Left: Left Pain - part of body: Hip     Time: 1300-1315 PT Time Calculation (min) (ACUTE ONLY): 15 min  Charges:  $Therapeutic Exercise: 8-22 mins                    Mikel Cella, PTA    Becky Trevino 09/17/2021, 2:25 PM

## 2021-09-17 NOTE — TOC Progression Note (Signed)
Transition of Care [facility]) - Progression Note    Patient Details  Name: Becky Trevino MRN: [id number] Date of Birth: 1945/11/11  Transition [facility] Care [facility]) CM/SW [place], RN Phone Number: 09/17/2021, 9:15 AM  Clinical Narrative:    No bed offers yet, reached out to local facilities requesting to review for a bed offer, will review once obtained        Expected Discharge Plan and Services                                                 Social Determinants of Health (SDOH) Interventions    Readmission Risk Interventions No flowsheet data found.

## 2021-09-17 NOTE — Plan of Care (Signed)
  Problem: Clinical Measurements: Goal: Ability to maintain clinical measurements within normal limits will improve Outcome: Progressing   

## 2021-09-17 NOTE — Progress Notes (Signed)
Infected prostatic?

## 2021-09-17 NOTE — Progress Notes (Signed)
PROGRESS NOTE    Becky Trevino  HAL:[id number] DOB: 1945-11-03 DOA: 09/14/2021 PCP: [name], MD   Brief Narrative:  76 year old female presenting from home with past medical history of paroxysmal atrial fibrillation (on Eliquis), hyperlipidemia, hypertension, history of SVT, pulmonary hypertension, chronic kidney disease stage IIIB, non-insulin-dependent diabetes mellitus type 2, obesity, obstructive sleep apnea, chronic DVT of the right lower extremity status post IVC filter placement, lymphedema, hypothyroidism who presents to [facility] emergency department with complaints of fevers and weakness.  Presents with 3 days of fever, malaise noted to have left leg cellulitis improving with IV antibiotics as below.  Assessment & Plan:   Cellulitis of medial left lower extremity- (present on admission) Bacteremia, POA -Routine blood cultures now positive 4/4 - BC ID showing Streptococcus species -Infectious disease consulted given patient's remarkable infectious history -Orthopedic surgery consulted for further insight and recommendations given infectious disease concern over seated prosthetic as potential source. -Continue ceftriaxone, defer to ID and pharmacy on antibiotic changes  Acute metabolic encephalopathy, secondary to above, POA, resolving Rule out sundowning/polypharmacy -Resolving, back to baseline per husband at bedside today -Likely secondary to above  Acute renal failure superimposed on stage 3b chronic kidney disease, resolving -Baseline creatinine around 1.1-1.2, approaching baseline   Elevated troponin level in the setting of dehydration, ACS ruled out -Likely secondary to AKI and profound dehydration, without chest pain or EKG changes   Paroxysmal atrial fibrillation, rate controlled  Continue home regimen of anticoagulation with Eliquis Continue home regimen of rate controlling agent with amiodarone and metoprolol   Type 2 diabetes mellitus with stage 3b chronic  kidney disease, without long-term current use of insulin ([place])- (present on admission) Well controlled - continue home meds   Ambulatory dysfunction -limiting therapy - PT as above currently recommending SNF given unsafe ambulatory status Essential hypertension - Continue home meds  Hypothyroidism- (present on admission)-continue home Synthroid    DVT prophylaxis: Eliquis Code Status: Full Family Communication: Husband at bedside  Status is: Inpatient  Dispo: The patient is from: Home              Anticipated d/c is to: SNF              Anticipated d/c date is: 48-72 hours pending clinical course              Patient currently not medically stable for discharge  Consultants:  None  Procedures:  None  Antimicrobials:  Ceftriaxone  Subjective: No acute issues or events overnight denies nausea vomiting diarrhea constipation headache fevers chills chest pain shortness of breath.  Right hip pain ongoing, limiting ambulation as above.  Objective: Vitals:   09/16/21 0818 09/16/21 1542 09/16/21 2122 09/17/21 0550  BP: (!) 106/53 108/61 (!) 110/57 (!) 95/55  Pulse: 85 95 (!) 101 96  Resp: 16 17 17 18   Temp: 99.4 F (37.4 C) 98.3 F (36.8 C) 100.1 F (37.8 C) 100.1 F (37.8 C)  TempSrc:      SpO2: 94% 90% 96% 92%  Weight:      Height:        Intake/Output Summary (Last 24 hours) at 09/17/2021 0716 Last data filed at 09/17/2021 0554 Gross per 24 hour  Intake 120 ml  Output 600 ml  Net -480 ml    Filed Weights   09/14/21 1654  Weight: 136.1 kg    Examination:  General:  Pleasantly resting in bed, No acute distress. HEENT:  Normocephalic atraumatic.  Sclerae nonicteric, noninjected.  Extraocular movements intact  bilaterally. Neck:  Without mass or deformity.  Trachea is midline. Lungs:  Clear to auscultate bilaterally without rhonchi, wheeze, or rales. Heart:  Regular rate and rhythm.  Without murmurs, rubs, or gallops. Abdomen:  Soft, nontender, nondistended.   Without guarding or rebound. Extremities: Without cyanosis, clubbing, edema, or obvious deformity. Vascular:  Dorsalis pedis and posterior tibial pulses palpable bilaterally. Skin:  Warm and dry, left medial thigh blanching erythema withdrawing from previous demarcation/outline in the ED  Data Reviewed: I have personally reviewed following labs and imaging studies  CBC: Recent Labs  Lab 09/14/21 1657 09/15/21 0533 09/16/21 0312 09/17/21 0505  WBC 8.5 8.6 9.0 8.3  NEUTROABS 7.6 7.3  --   --   HGB 12.0 11.3* 10.3* 10.5*  HCT 37.3 34.6* 33.0* 31.6*  MCV 98.2 96.1 98.8 96.0  PLT 137* 126* 121* 145*    Basic Metabolic Panel: Recent Labs  Lab 09/14/21 1657 09/15/21 0533 09/16/21 0312 09/17/21 0505  NA 135 132* 135 134*  K 3.8 3.7 4.1 3.6  CL 99 101 103 103  CO2 26 22 24 23   GLUCOSE 106* 100* 84 102*  BUN 23 22 23 23   CREATININE 1.51* 1.31* 1.39* 1.28*  CALCIUM 9.1 8.8* 8.7* 8.5*  MG  --  2.1  --   --     GFR: Estimated Creatinine Clearance: 58.1 mL/min (A) (by C-G formula based on SCr of 1.28 mg/dL (H)). Liver Function Tests: Recent Labs  Lab 09/14/21 1657 09/15/21 0533  AST 29 21  ALT 20 18  ALKPHOS 66 60  BILITOT 1.1 1.1  PROT 8.2* 7.5  ALBUMIN 3.7 3.2*    No results for input(s): LIPASE, AMYLASE in the last 168 hours. No results for input(s): AMMONIA in the last 168 hours. Coagulation Profile: No results for input(s): INR, PROTIME in the last 168 hours. Cardiac Enzymes: No results for input(s): CKTOTAL, CKMB, CKMBINDEX, TROPONINI in the last 168 hours. BNP (last 3 results) No results for input(s): PROBNP in the last 8760 hours. HbA1C: Recent Labs    09/16/21 0312  HGBA1C 5.4   CBG: Recent Labs  Lab 09/15/21 2156 09/16/21 0754 09/16/21 1235 09/16/21 1607 09/16/21 2115  GLUCAP 114* 76 96 107* 102*    Lipid Profile: No results for input(s): CHOL, HDL, LDLCALC, TRIG, CHOLHDL, LDLDIRECT in the last 72 hours. Thyroid Function Tests: No results  for input(s): TSH, T4TOTAL, FREET4, T3FREE, THYROIDAB in the last 72 hours. Anemia Panel: No results for input(s): VITAMINB12, FOLATE, FERRITIN, TIBC, IRON, RETICCTPCT in the last 72 hours. Sepsis Labs: Recent Labs  Lab 09/14/21 1657 09/14/21 1943 09/15/21 2211 09/16/21 0137  PROCALCITON 5.38  --   --   --   LATICACIDVEN 1.6 1.6 1.3 1.4     Recent Results (from the past 240 hour(s))  Resp Panel by RT-PCR (Flu A&B, Covid) Nasopharyngeal Swab     Status: None   Collection Time: 09/14/21  4:57 PM   Specimen: Nasopharyngeal Swab; Nasopharyngeal(NP) swabs in vial transport medium  Result Value Ref Range Status   SARS Coronavirus 2 by RT PCR NEGATIVE NEGATIVE Final    Comment: (NOTE) SARS-CoV-2 target nucleic acids are NOT DETECTED.  The SARS-CoV-2 RNA is generally detectable in upper respiratory specimens during the acute phase of infection. The lowest concentration of SARS-CoV-2 viral copies this assay can detect is 138 copies/mL. A negative result does not preclude SARS-Cov-2 infection and should not be used as the sole basis for treatment or other patient management decisions. A negative result  may occur with  improper specimen collection/handling, submission of specimen other than nasopharyngeal swab, presence of viral mutation(s) within the areas targeted by this assay, and inadequate number of viral copies(<138 copies/mL). A negative result must be combined with clinical observations, patient history, and epidemiological information. The expected result is Negative.  Fact Sheet for Patients:  EntrepreneurPulse.com.au  Fact Sheet for Healthcare Providers:  IncredibleEmployment.be  This test is no t yet approved or cleared by the [place] FDA and  has been authorized for detection and/or diagnosis of SARS-CoV-2 by FDA under an Emergency Use Authorization (EUA). This EUA will remain  in effect (meaning this test can be used) for the  duration of the COVID-19 declaration under Section 564(b)(1) of the Act, 21 U.S.C.section 360bbb-3(b)(1), unless the authorization is terminated  or revoked sooner.       Influenza A by PCR NEGATIVE NEGATIVE Final   Influenza B by PCR NEGATIVE NEGATIVE Final    Comment: (NOTE) The Xpert Xpress SARS-CoV-2/FLU/RSV plus assay is intended as an aid in the diagnosis of influenza from Nasopharyngeal swab specimens and should not be used as a sole basis for treatment. Nasal washings and aspirates are unacceptable for Xpert Xpress SARS-CoV-2/FLU/RSV testing.  Fact Sheet for Patients: EntrepreneurPulse.com.au  Fact Sheet for Healthcare Providers: IncredibleEmployment.be  This test is not yet approved or cleared by the [place] FDA and has been authorized for detection and/or diagnosis of SARS-CoV-2 by FDA under an Emergency Use Authorization (EUA). This EUA will remain in effect (meaning this test can be used) for the duration of the COVID-19 declaration under Section 564(b)(1) of the Act, 21 U.S.C. section 360bbb-3(b)(1), unless the authorization is terminated or revoked.  Performed at [facility], [address] [place], [place] [address]   Culture, blood (Routine X 2) w Reflex to ID Panel     Status: None (Preliminary result)   Collection Time: 09/14/21 11:15 PM   Specimen: BLOOD  Result Value Ref Range Status   Specimen Description BLOOD RIGHT ANTECUBITAL  Final   Special Requests   Final    BOTTLES DRAWN AEROBIC AND ANAEROBIC Blood Culture results may not be optimal due to an excessive volume of blood received in culture bottles   Culture  Setup Time   Final    GRAM POSITIVE COCCI IN BOTH AEROBIC AND ANAEROBIC BOTTLES CRITICAL VALUE NOTED.  VALUE IS CONSISTENT WITH PREVIOUSLY REPORTED AND CALLED VALUE. Performed at [facility], [address] [place], [place] [address]    Culture [facility] POSITIVE COCCI  Final    Report Status PENDING  Incomplete  Resp Panel by RT-PCR (Flu A&B, Covid) Nasopharyngeal Swab     Status: None   Collection Time: 09/14/21 11:15 PM   Specimen: Nasopharyngeal Swab; Nasopharyngeal(NP) swabs in vial transport medium  Result Value Ref Range Status   SARS Coronavirus 2 by RT PCR NEGATIVE NEGATIVE Final    Comment: (NOTE) SARS-CoV-2 target nucleic acids are NOT DETECTED.  The SARS-CoV-2 RNA is generally detectable in upper respiratory specimens during the acute phase of infection. The lowest concentration of SARS-CoV-2 viral copies this assay can detect is 138 copies/mL. A negative result does not preclude SARS-Cov-2 infection and should not be used as the sole basis for treatment or other patient management decisions. A negative result may occur with  improper specimen collection/handling, submission of specimen other than nasopharyngeal swab, presence of viral mutation(s) within the areas targeted by this assay, and inadequate number of viral copies(<138 copies/mL). A negative result  must be combined with clinical observations, patient history, and epidemiological information. The expected result is Negative.  Fact Sheet for Patients:  EntrepreneurPulse.com.au  Fact Sheet for Healthcare Providers:  IncredibleEmployment.be  This test is no t yet approved or cleared by the [place] FDA and  has been authorized for detection and/or diagnosis of SARS-CoV-2 by FDA under an Emergency Use Authorization (EUA). This EUA will remain  in effect (meaning this test can be used) for the duration of the COVID-19 declaration under Section 564(b)(1) of the Act, 21 U.S.C.section 360bbb-3(b)(1), unless the authorization is terminated  or revoked sooner.       Influenza A by PCR NEGATIVE NEGATIVE Final   Influenza B by PCR NEGATIVE NEGATIVE Final    Comment: (NOTE) The Xpert Xpress SARS-CoV-2/FLU/RSV plus assay is intended as an aid in the  diagnosis of influenza from Nasopharyngeal swab specimens and should not be used as a sole basis for treatment. Nasal washings and aspirates are unacceptable for Xpert Xpress SARS-CoV-2/FLU/RSV testing.  Fact Sheet for Patients: EntrepreneurPulse.com.au  Fact Sheet for Healthcare Providers: IncredibleEmployment.be  This test is not yet approved or cleared by the [place] FDA and has been authorized for detection and/or diagnosis of SARS-CoV-2 by FDA under an Emergency Use Authorization (EUA). This EUA will remain in effect (meaning this test can be used) for the duration of the COVID-19 declaration under Section 564(b)(1) of the Act, 21 U.S.C. section 360bbb-3(b)(1), unless the authorization is terminated or revoked.  Performed at [facility], [address] [place], [place] [address]   Culture, blood (Routine X 2) w Reflex to ID Panel     Status: None (Preliminary result)   Collection Time: 09/15/21  2:26 AM   Specimen: BLOOD  Result Value Ref Range Status   Specimen Description BLOOD BLOOD RIGHT HAND  Final   Special Requests   Final    BOTTLES DRAWN AEROBIC AND ANAEROBIC Blood Culture adequate volume   Culture  Setup Time   Final    Organism ID to follow GRAM POSITIVE COCCI IN BOTH AEROBIC AND ANAEROBIC BOTTLES CRITICAL RESULT CALLED TO, READ BACK BY AND VERIFIED WITHLloyd Huger [id number] 09/16/21 HNM Performed at [facility], [address] [place], [place] [address]    Culture GRAM POSITIVE COCCI  Final   Report Status PENDING  Incomplete  Blood Culture ID Panel (Reflexed)     Status: Abnormal   Collection Time: 09/15/21  2:26 AM  Result Value Ref Range Status   Enterococcus faecalis NOT DETECTED NOT DETECTED Final   Enterococcus Faecium NOT DETECTED NOT DETECTED Final   Listeria monocytogenes NOT DETECTED NOT DETECTED Final   Staphylococcus species NOT DETECTED NOT DETECTED Final   Staphylococcus  aureus (BCID) NOT DETECTED NOT DETECTED Final   Staphylococcus epidermidis NOT DETECTED NOT DETECTED Final   Staphylococcus lugdunensis NOT DETECTED NOT DETECTED Final   Streptococcus species DETECTED (A) NOT DETECTED Final    Comment: CRITICAL RESULT CALLED TO, READ BACK BY AND VERIFIED WITHLloyd Huger PHARMD [id number] 09/16/21 HNM    Streptococcus agalactiae DETECTED (A) NOT DETECTED Final    Comment: CRITICAL RESULT CALLED TO, READ BACK BY AND VERIFIED WITH: NATHAN BELUE PHARMD 0517 09/16/21 HNM    Streptococcus pneumoniae NOT DETECTED NOT DETECTED Final   Streptococcus pyogenes NOT DETECTED NOT DETECTED Final   A.calcoaceticus-baumannii NOT DETECTED NOT DETECTED Final   Bacteroides fragilis NOT DETECTED NOT DETECTED Final   Enterobacterales NOT DETECTED NOT DETECTED Final   Enterobacter  cloacae complex NOT DETECTED NOT DETECTED Final   Escherichia coli NOT DETECTED NOT DETECTED Final   Klebsiella aerogenes NOT DETECTED NOT DETECTED Final   Klebsiella oxytoca NOT DETECTED NOT DETECTED Final   Klebsiella pneumoniae NOT DETECTED NOT DETECTED Final   Proteus species NOT DETECTED NOT DETECTED Final   Salmonella species NOT DETECTED NOT DETECTED Final   Serratia marcescens NOT DETECTED NOT DETECTED Final   Haemophilus influenzae NOT DETECTED NOT DETECTED Final   Neisseria meningitidis NOT DETECTED NOT DETECTED Final   Pseudomonas aeruginosa NOT DETECTED NOT DETECTED Final   Stenotrophomonas maltophilia NOT DETECTED NOT DETECTED Final   Candida albicans NOT DETECTED NOT DETECTED Final   Candida auris NOT DETECTED NOT DETECTED Final   Candida glabrata NOT DETECTED NOT DETECTED Final   Candida krusei NOT DETECTED NOT DETECTED Final   Candida parapsilosis NOT DETECTED NOT DETECTED Final   Candida tropicalis NOT DETECTED NOT DETECTED Final   Cryptococcus neoformans/gattii NOT DETECTED NOT DETECTED Final    Comment: Performed at [facility], [address], [place], [place] [address]           Radiology Studies: No results found.      Scheduled Meds:  amiodarone  200 mg Oral BID   apixaban  5 mg Oral BID   aspirin EC  81 mg Oral Daily   atorvastatin  40 mg Oral QPM   brinzolamide  1 drop Both Eyes TID   And   brimonidine  1 drop Both Eyes TID   escitalopram  5 mg Oral Daily   fluticasone  2 spray Each Nare Daily   insulin aspart  0-15 Units Subcutaneous TID AC & HS   latanoprost  1 drop Both Eyes QHS   levothyroxine  25 mcg Oral Q0600   metoprolol succinate  50 mg Oral Q breakfast   mirabegron ER  50 mg Oral Daily   Continuous Infusions:  cefTRIAXone (ROCEPHIN)  IV Stopped (09/17/21 0618)     LOS: 2 days   Time spent: 75min  Ran Tullis C Jacoby Ritsema, DO [facility]  If 7PM-7AM, please contact night-coverage [email or web address]  09/17/2021, 7:16 AM

## 2021-09-18 DIAGNOSIS — R7881 Bacteremia: Secondary | ICD-10-CM | POA: Diagnosis not present

## 2021-09-18 DIAGNOSIS — B955 Unspecified streptococcus as the cause of diseases classified elsewhere: Secondary | ICD-10-CM

## 2021-09-18 LAB — GLUCOSE, CAPILLARY
Glucose-Capillary: 116 mg/dL — ABNORMAL HIGH (ref 70–99)
Glucose-Capillary: 109 mg/dL — ABNORMAL HIGH (ref 70–99)
Glucose-Capillary: 104 mg/dL — ABNORMAL HIGH (ref 70–99)
Glucose-Capillary: 119 mg/dL — ABNORMAL HIGH (ref 70–99)

## 2021-09-18 LAB — CBC
HCT: 29.1 % — ABNORMAL LOW (ref 36.0–46.0)
Hemoglobin: 9.4 g/dL — ABNORMAL LOW (ref 12.0–15.0)
MCH: 30.9 pg (ref 26.0–34.0)
MCHC: 32.3 g/dL (ref 30.0–36.0)
MCV: 95.7 fL (ref 80.0–100.0)
Platelets: 157 10*3/uL (ref 150–400)
RBC: 3.04 MIL/uL — ABNORMAL LOW (ref 3.87–5.11)
RDW: 18.4 % — ABNORMAL HIGH (ref 11.5–15.5)
WBC: 9.7 10*3/uL (ref 4.0–10.5)
nRBC: 0 % (ref 0.0–0.2)

## 2021-09-18 LAB — BASIC METABOLIC PANEL
Anion gap: 6 (ref 5–15)
BUN: 21 mg/dL (ref 8–23)
CO2: 26 mmol/L (ref 22–32)
Calcium: 8.6 mg/dL — ABNORMAL LOW (ref 8.9–10.3)
Chloride: 102 mmol/L (ref 98–111)
Creatinine, Ser: 1.28 mg/dL — ABNORMAL HIGH (ref 0.44–1.00)
GFR, Estimated: 44 mL/min — ABNORMAL LOW (ref >60)
Glucose, Bld: 102 mg/dL — ABNORMAL HIGH (ref 70–99)
Potassium: 3.7 mmol/L (ref 3.5–5.1)
Sodium: 134 mmol/L — ABNORMAL LOW (ref 135–145)

## 2021-09-18 LAB — CULTURE, BLOOD (ROUTINE X 2): Special Requests: ADEQUATE

## 2021-09-18 MED ORDER — METOPROLOL SUCCINATE ER 25 MG PO TB24
25.0000 mg | ORAL_TABLET | Freq: Two times a day (BID) | ORAL | Status: DC
  Administered 2021-09-18 – 2021-09-22 (×7): 25 mg via ORAL
  Filled 2021-09-18 (×10): qty 1

## 2021-09-18 MED ORDER — BISACODYL 10 MG RE SUPP
10.0000 mg | Freq: Every day | RECTAL | Status: DC | PRN
  Administered 2021-09-18: 10 mg via RECTAL
  Filled 2021-09-18 (×2): qty 1

## 2021-09-18 MED ORDER — SODIUM CHLORIDE 0.9 % IV SOLN: INTRAVENOUS | Status: DC

## 2021-09-18 MED ORDER — SODIUM CHLORIDE 0.9 % IV SOLN: INTRAVENOUS | Status: DC | PRN

## 2021-09-18 NOTE — TOC Progression Note (Signed)
Transition of Care [facility]) - Progression Note    Patient Details  Name: Becky Trevino MRN: [id number] Date of Birth: [birth year]  Transition of [facility]) CM/SW [place], RN Phone Number: 09/18/2021, 9:49 AM  Clinical Narrative:    TOC to continue to follow and assist with DC plan and needs, The patient is not yet medically stable        Expected Discharge Plan and Services                                                 Social Determinants of Health (SDOH) Interventions    Readmission Risk Interventions No flowsheet data found.

## 2021-09-18 NOTE — Progress Notes (Signed)
PROGRESS NOTE    Becky ISHII  ZRA:[id number] DOB: Jan 03, 1946 DOA: 09/14/2021 PCP: [name], MD   Chief Complaint  Patient presents with   Fever  Brief Narrative/Hospital Course: Becky Trevino, 76 y.o. female with PMH of  paroxysmal atrial fibrillation (on Eliquis), hyperlipidemia, hypertension, history of SVT, pulmonary hypertension, chronic kidney disease stage IIIB, non-insulin-dependent diabetes mellitus type 2, obesity, obstructive sleep apnea, chronic DVT of the right lower extremity status post IVC filter placement, lymphedema, hypothyroidism who presents to [facility] ED with fever, weakness of 3 days and found to have left leg cellulitis, ID following.     Subjective: Some Rt knee and rt hip pain- able to move both legs, non pitting edema in legs On RA Afebrile overnight with T-max 99.4 blood pressure 10 5-1 20s, on room air Lab with stable renal function 1.2, hemoglobin 9.4 g, no leukocytosis. Lives with husband and sister  Assessment & Plan:  Recurrent Group B streptococcus bacteremia: Third episode in past 24 months notably cellulitis of the left thigh currently.  Initially had a right ankle ORIF infection in February 2021 then left thigh cellulitis in November 2021.  ID on board-suspecting right hip as the source ID planning for aspiration and TEE, orthopedic on board-Dr Rudene Christians arranging IR to aspirate.  I will consult cardiology for TEE.Continue with ceftriaxone will need 6 weeks minimum followed by suppressive therapy with amoxicillin.  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  Rule out sundowning polypharmacy  AKI on CKD stage IIIb: Creatinine improved to 1.2 on presentation 1.5.  Monitor Recent Labs  Lab 09/14/21 1657 09/15/21 0533 09/16/21 0312 09/17/21 0505 09/18/21 0434  BUN 23 22 23 23 21    CREATININE 1.51* 1.31* 1.39* 1.28* 1.28*    Left THA clinically no evidence of infection History of cellulitis and MRSA bacteremia and GBS history of bacteremia and December 2021, history of pelvic/gluteal hematoma after falling November 2022  Anemia of chronic disease hemoglobin stable 9 to 10 g range.  Monitor  A. fib on Eliquis amiodarone and metoprolol, rate is controlled  Essential hypertension: BP controlled continue metoprolol. HLD on Lipitor T2DM with CKD without long-term insulin: HbA1c stable 5.4, blood sugar well controlled.  Keep on SSI. Recent Labs  Lab 09/17/21 1510 09/17/21 1700 09/17/21 2106 09/18/21 0731 09/18/21 1214  GLUCAP 122* 135* 128* 116* 109*    Hypothyroidism continue Synthroid  Ambulatory dysfunction: PT OT eval will need a skilled nursing facility.  Elevated troponin in the setting of dehydration ACS ruled out likely due to AKI and dehydration not changes of ischemia Constipation we will give Dulcolax PR Lymphedema bilateral lower extremity nonpitting Class III Obesity:Patient's Body mass index is 41.84 kg/m. : Will benefit with PCP follow-up, weight loss  healthy lifestyle and outpatient sleep evaluation.  DVT prophylaxis: eliquis Code Status:   Code Status: Full Code Family Communication: plan of care discussed with patient at bedside. Status is: Inpatient Remains inpatient appropriate because: For ongoing work-up of bacteremia Disposition: Currently not medically stable for discharge. Anticipated Disposition: SNF   Objective: Vitals last 24 hrs: Vitals:   09/17/21 2207 09/17/21 2341 09/18/21 0410 09/18/21 0730  BP: 110/60 (!) 105/57 120/63 112/60  Pulse: 99 99 96 93  Resp:  18 18 15   Temp:  99.4 F (37.4 C) 99 F (37.2 C) 98.9 F (37.2 C)  TempSrc:      SpO2:  90% 95% 93%  Weight:      Height:       Weight change:   Intake/Output Summary (Last 24 hours) at 09/18/2021 0926 Last data filed at 09/18/2021 9935 Gross per 24 hour   Intake 200.87 ml  Output 0 ml  Net 200.87 ml   Net IO Since Admission: -678.8 mL [09/18/21 0926]   Physical Examination: General exam: AA0X3, weak,older than stated age. HEENT:Oral mucosa moist, Ear/Nose WNL grossly,dentition normal. Respiratory system: B/l diminished BS, no use of accessory muscle, non tender. Cardiovascular system: S1 & S2 +,No JVD. Gastrointestinal system: Abdomen soft, NT,ND, BS+. Nervous System:Alert, awake, moving extremities. Extremities: edema bilateral lower extremity nonpitting, distal peripheral pulses palpable.  Skin: No rashes, no icterus. MSK: Normal muscle bulk, tone, power.  Medications reviewed:  Scheduled Meds:  amiodarone  200 mg Oral BID   apixaban  5 mg Oral BID   aspirin EC  81 mg Oral Daily   atorvastatin  40 mg Oral QPM   brinzolamide  1 drop Both Eyes TID   And   brimonidine  1 drop Both Eyes TID   escitalopram  5 mg Oral Daily   fluticasone  2 spray Each Nare Daily   insulin aspart  0-15 Units Subcutaneous TID AC & HS   latanoprost  1 drop Both Eyes QHS   levothyroxine  25 mcg Oral Q0600   metoprolol succinate  50 mg Oral Q breakfast   mirabegron ER  50 mg Oral Daily   Continuous Infusions:  sodium chloride Stopped (09/18/21 0603)   cefTRIAXone (ROCEPHIN)  IV Stopped (09/18/21 0559)    Diet Order             Diet Heart Room service appropriate? Yes; Fluid consistency: Thin  Diet effective now                          Weight change:   Wt Readings from Last 3 Encounters:  09/14/21 136.1 kg  07/23/21 128.4 kg  07/19/21 128.5 kg     Consultants:see note  Procedures:see note Antimicrobials: Anti-infectives (From admission, onward)    Start     Dose/Rate Route Frequency Ordered Stop   09/15/21 0300  cefTRIAXone (ROCEPHIN) 2 g in sodium chloride 0.9 % 100 mL IVPB        2 g 200 mL/hr over 30 Minutes Intravenous Daily 09/15/21 0152 09/22/21 0559      Culture/Microbiology    Component Value Date/Time    SDES BLOOD BLOOD RIGHT HAND 09/17/2021 0505   SPECREQUEST  09/17/2021 0505    IN PEDIATRIC BOTTLE Blood Culture results may not be optimal due to an excessive volume of blood received in culture bottles   CULT  09/17/2021 0505    NO GROWTH 1 DAY Performed at [facility], [address] [place], [place] [address]    REPTSTATUS PENDING 09/17/2021 0505    Other culture-see note  Unresulted Labs (From admission, onward)     Start     Ordered   09/16/21 0500  CBC  Daily,   R     Question:  Specimen collection method  Answer:  Lab=Lab collect   09/15/21 1253   09/16/21 9390  Basic metabolic panel  Daily,   R     Question:  Specimen collection method  Answer:  Lab=Lab collect   09/15/21 1253  Data Reviewed: I have personally reviewed following labs and imaging studies CBC: Recent Labs  Lab 09/14/21 1657 09/15/21 0533 09/16/21 0312 09/17/21 0505 09/18/21 0434  WBC 8.5 8.6 9.0 8.3 9.7  NEUTROABS 7.6 7.3  --   --   --   HGB 12.0 11.3* 10.3* 10.5* 9.4*  HCT 37.3 34.6* 33.0* 31.6* 29.1*  MCV 98.2 96.1 98.8 96.0 95.7  PLT 137* 126* 121* 145* 250   Basic Metabolic Panel: Recent Labs  Lab 09/14/21 1657 09/15/21 0533 09/16/21 0312 09/17/21 0505 09/18/21 0434  NA 135 132* 135 134* 134*  K 3.8 3.7 4.1 3.6 3.7  CL 99 101 103 103 102  CO2 26 22 24 23 26   GLUCOSE 106* 100* 84 102* 102*  BUN 23 22 23 23 21   CREATININE 1.51* 1.31* 1.39* 1.28* 1.28*  CALCIUM 9.1 8.8* 8.7* 8.5* 8.6*  MG  --  2.1  --   --   --    GFR: Estimated Creatinine Clearance: 58.1 mL/min (A) (by C-G formula based on SCr of 1.28 mg/dL (H)). Liver Function Tests: Recent Labs  Lab 09/14/21 1657 09/15/21 0533  AST 29 21  ALT 20 18  ALKPHOS 66 60  BILITOT 1.1 1.1  PROT 8.2* 7.5  ALBUMIN 3.7 3.2*   No results for input(s): LIPASE, AMYLASE in the last 168 hours. No results for input(s): AMMONIA in the last 168 hours. Coagulation Profile: No results for input(s): INR, PROTIME in  the last 168 hours. Cardiac Enzymes: No results for input(s): CKTOTAL, CKMB, CKMBINDEX, TROPONINI in the last 168 hours. BNP (last 3 results) No results for input(s): PROBNP in the last 8760 hours. HbA1C: Recent Labs    09/16/21 0312  HGBA1C 5.4   CBG: Recent Labs  Lab 09/17/21 1130 09/17/21 1510 09/17/21 1700 09/17/21 2106 09/18/21 0731  GLUCAP 110* 122* 135* 128* 116*   Lipid Profile: No results for input(s): CHOL, HDL, LDLCALC, TRIG, CHOLHDL, LDLDIRECT in the last 72 hours. Thyroid Function Tests: No results for input(s): TSH, T4TOTAL, FREET4, T3FREE, THYROIDAB in the last 72 hours. Anemia Panel: No results for input(s): VITAMINB12, FOLATE, FERRITIN, TIBC, IRON, RETICCTPCT in the last 72 hours. Sepsis Labs: Recent Labs  Lab 09/14/21 1657 09/14/21 1943 09/15/21 2211 09/16/21 0137  PROCALCITON 5.38  --   --   --   LATICACIDVEN 1.6 1.6 1.3 1.4    Recent Results (from the past 240 hour(s))  Resp Panel by RT-PCR (Flu A&B, Covid) Nasopharyngeal Swab     Status: None   Collection Time: 09/14/21  4:57 PM   Specimen: Nasopharyngeal Swab; Nasopharyngeal(NP) swabs in vial transport medium  Result Value Ref Range Status   SARS Coronavirus 2 by RT PCR NEGATIVE NEGATIVE Final    Comment: (NOTE) SARS-CoV-2 target nucleic acids are NOT DETECTED.  The SARS-CoV-2 RNA is generally detectable in upper respiratory specimens during the acute phase of infection. The lowest concentration of SARS-CoV-2 viral copies this assay can detect is 138 copies/mL. A negative result does not preclude SARS-Cov-2 infection and should not be used as the sole basis for treatment or other patient management decisions. A negative result may occur with  improper specimen collection/handling, submission of specimen other than nasopharyngeal swab, presence of viral mutation(s) within the areas targeted by this assay, and inadequate number of viral copies(<138 copies/mL). A negative result must be  combined with clinical observations, patient history, and epidemiological information. The expected result is Negative.  Fact Sheet for Patients:  EntrepreneurPulse.com.au  Fact Sheet for Healthcare  Providers:  IncredibleEmployment.be  This test is no t yet approved or cleared by the [place] and  has been authorized for detection and/or diagnosis of SARS-CoV-2 by FDA under an Emergency Use Authorization (EUA). This EUA will remain  in effect (meaning this test can be used) for the duration of the COVID-19 declaration under Section 564(b)(1) of the Act, 21 U.S.C.section 360bbb-3(b)(1), unless the authorization is terminated  or revoked sooner.       Influenza A by PCR NEGATIVE NEGATIVE Final   Influenza B by PCR NEGATIVE NEGATIVE Final    Comment: (NOTE) The Xpert Xpress SARS-CoV-2/FLU/RSV plus assay is intended as an aid in the diagnosis of influenza from Nasopharyngeal swab specimens and should not be used as a sole basis for treatment. Nasal washings and aspirates are unacceptable for Xpert Xpress SARS-CoV-2/FLU/RSV testing.  Fact Sheet for Patients: EntrepreneurPulse.com.au  Fact Sheet for Healthcare Providers: IncredibleEmployment.be  This test is not yet approved or cleared by the [place] FDA and has been authorized for detection and/or diagnosis of SARS-CoV-2 by FDA under an Emergency Use Authorization (EUA). This EUA will remain in effect (meaning this test can be used) for the duration of the COVID-19 declaration under Section 564(b)(1) of the Act, 21 U.S.C. section 360bbb-3(b)(1), unless the authorization is terminated or revoked.  Performed at [facility], [address] [place], [place] [address]   Culture, blood (Routine X 2) w Reflex to ID Panel     Status: Abnormal   Collection Time: 09/14/21 11:15 PM   Specimen: BLOOD  Result Value Ref Range Status    Specimen Description   Final    BLOOD RIGHT ANTECUBITAL Performed at [facility], [address] [place], [place] [address]    Special Requests   Final    BOTTLES DRAWN AEROBIC AND ANAEROBIC Blood Culture results may not be optimal due to an excessive volume of blood received in culture bottles Performed at [facility], [address], [place], [place] [address]    Culture  Setup Time   Final    GRAM POSITIVE COCCI IN BOTH AEROBIC AND ANAEROBIC BOTTLES CRITICAL VALUE NOTED.  VALUE IS CONSISTENT WITH PREVIOUSLY REPORTED AND CALLED VALUE. Performed at [facility], [address] [place], [place] [address]    Culture (A)  Final    STREPTOCOCCUS AGALACTIAE SUSCEPTIBILITIES PERFORMED ON PREVIOUS CULTURE WITHIN THE LAST 5 DAYS. STAPHYLOCOCCUS CAPITIS THE SIGNIFICANCE OF ISOLATING THIS ORGANISM FROM A SINGLE SET OF BLOOD CULTURES WHEN MULTIPLE SETS ARE DRAWN IS UNCERTAIN. PLEASE NOTIFY THE MICROBIOLOGY DEPARTMENT WITHIN ONE WEEK IF SPECIATION AND SENSITIVITIES ARE REQUIRED. Performed at [facility], [place] [address], [place], [place] [address]    Report Status 09/18/2021 FINAL  Final  Resp Panel by RT-PCR (Flu A&B, Covid) Nasopharyngeal Swab     Status: None   Collection Time: 09/14/21 11:15 PM   Specimen: Nasopharyngeal Swab; Nasopharyngeal(NP) swabs in vial transport medium  Result Value Ref Range Status   SARS Coronavirus 2 by RT PCR NEGATIVE NEGATIVE Final    Comment: (NOTE) SARS-CoV-2 target nucleic acids are NOT DETECTED.  The SARS-CoV-2 RNA is generally detectable in upper respiratory specimens during the acute phase of infection. The lowest concentration of SARS-CoV-2 viral copies this assay can detect is 138 copies/mL. A negative result does not preclude SARS-Cov-2 infection and should not be used as the sole basis for treatment or other patient management decisions. A negative result may occur with  improper specimen collection/handling,  submission of specimen other than nasopharyngeal swab, presence of viral mutation(s) within the areas targeted by this assay, and inadequate number of viral copies(<138 copies/mL). A negative result must be combined with clinical observations, patient history, and epidemiological information. The expected result is Negative.  Fact Sheet for Patients:  EntrepreneurPulse.com.au  Fact Sheet for Healthcare Providers:  IncredibleEmployment.be  This test is no t yet approved or cleared by the [place] FDA and  has been authorized for detection and/or diagnosis of SARS-CoV-2 by FDA under an Emergency Use Authorization (EUA). This EUA will remain  in effect (meaning this test can be used) for the duration of the COVID-19 declaration under Section 564(b)(1) of the Act, 21 U.S.C.section 360bbb-3(b)(1), unless the authorization is terminated  or revoked sooner.       Influenza A by PCR NEGATIVE NEGATIVE Final   Influenza B by PCR NEGATIVE NEGATIVE Final    Comment: (NOTE) The Xpert Xpress SARS-CoV-2/FLU/RSV plus assay is intended as an aid in the diagnosis of influenza from Nasopharyngeal swab specimens and should not be used as a sole basis for treatment. Nasal washings and aspirates are unacceptable for Xpert Xpress SARS-CoV-2/FLU/RSV testing.  Fact Sheet for Patients: EntrepreneurPulse.com.au  Fact Sheet for Healthcare Providers: IncredibleEmployment.be  This test is not yet approved or cleared by the [place] FDA and has been authorized for detection and/or diagnosis of SARS-CoV-2 by FDA under an Emergency Use Authorization (EUA). This EUA will remain in effect (meaning this test can be used) for the duration of the COVID-19 declaration under Section 564(b)(1) of the Act, 21 U.S.C. section 360bbb-3(b)(1), unless the authorization is terminated or revoked.  Performed at [facility], [address] [place], [place] [address]   Culture, blood (Routine X 2) w Reflex to ID Panel     Status: Abnormal   Collection Time: 09/15/21  2:26 AM   Specimen: BLOOD  Result Value Ref Range Status   Specimen Description   Final    BLOOD BLOOD RIGHT HAND Performed at [facility], [address] [place], [place] [address]    Special Requests   Final    BOTTLES DRAWN AEROBIC AND ANAEROBIC Blood Culture adequate volume Performed at [facility], [address] [place], [place] [address]    Culture  Setup Time   Final    Organism ID to follow GRAM POSITIVE COCCI IN BOTH AEROBIC AND ANAEROBIC BOTTLES CRITICAL RESULT CALLED TO, READ BACK BY AND VERIFIED WITHLloyd Huger [id number] [id number] 09/16/21 HNM Performed at [facility], [address] [place], [place] [address]    Culture GROUP B STREP(S.AGALACTIAE)ISOLATED (A)  Final   Report Status 09/18/2021 FINAL  Final   Organism ID, Bacteria GROUP B STREP(S.AGALACTIAE)ISOLATED  Final      Susceptibility   Group b strep(s.agalactiae)isolated - MIC*    CLINDAMYCIN >=1 RESISTANT Resistant     AMPICILLIN <=0.25 SENSITIVE Sensitive     ERYTHROMYCIN >=8 RESISTANT Resistant     VANCOMYCIN 0.5 SENSITIVE Sensitive     CEFTRIAXONE <=0.12 SENSITIVE Sensitive     LEVOFLOXACIN 1 SENSITIVE Sensitive     * GROUP B STREP(S.AGALACTIAE)ISOLATED  Blood Culture ID Panel (Reflexed)     Status: Abnormal   Collection Time: 09/15/21  2:26 AM  Result Value Ref Range Status   Enterococcus faecalis NOT DETECTED NOT DETECTED Final   Enterococcus Faecium NOT DETECTED NOT DETECTED Final   Listeria monocytogenes NOT DETECTED NOT DETECTED Final   Staphylococcus species NOT DETECTED NOT DETECTED  Final   Staphylococcus aureus (BCID) NOT DETECTED NOT DETECTED Final   Staphylococcus epidermidis NOT DETECTED NOT DETECTED Final   Staphylococcus lugdunensis NOT DETECTED NOT DETECTED Final   Streptococcus species DETECTED (A) NOT DETECTED Final     Comment: CRITICAL RESULT CALLED TO, READ BACK BY AND VERIFIED WITH: NATHAN BELUE PHARMD [id number] 09/16/21 HNM    Streptococcus agalactiae DETECTED (A) NOT DETECTED Final    Comment: CRITICAL RESULT CALLED TO, READ BACK BY AND VERIFIED WITH: NATHAN BELUE PHARMD 1443 09/16/21 HNM    Streptococcus pneumoniae NOT DETECTED NOT DETECTED Final   Streptococcus pyogenes NOT DETECTED NOT DETECTED Final   A.calcoaceticus-baumannii NOT DETECTED NOT DETECTED Final   Bacteroides fragilis NOT DETECTED NOT DETECTED Final   Enterobacterales NOT DETECTED NOT DETECTED Final   Enterobacter cloacae complex NOT DETECTED NOT DETECTED Final   Escherichia coli NOT DETECTED NOT DETECTED Final   Klebsiella aerogenes NOT DETECTED NOT DETECTED Final   Klebsiella oxytoca NOT DETECTED NOT DETECTED Final   Klebsiella pneumoniae NOT DETECTED NOT DETECTED Final   Proteus species NOT DETECTED NOT DETECTED Final   Salmonella species NOT DETECTED NOT DETECTED Final   Serratia marcescens NOT DETECTED NOT DETECTED Final   Haemophilus influenzae NOT DETECTED NOT DETECTED Final   Neisseria meningitidis NOT DETECTED NOT DETECTED Final   Pseudomonas aeruginosa NOT DETECTED NOT DETECTED Final   Stenotrophomonas maltophilia NOT DETECTED NOT DETECTED Final   Candida albicans NOT DETECTED NOT DETECTED Final   Candida auris NOT DETECTED NOT DETECTED Final   Candida glabrata NOT DETECTED NOT DETECTED Final   Candida krusei NOT DETECTED NOT DETECTED Final   Candida parapsilosis NOT DETECTED NOT DETECTED Final   Candida tropicalis NOT DETECTED NOT DETECTED Final   Cryptococcus neoformans/gattii NOT DETECTED NOT DETECTED Final    Comment: Performed at [facility], [address] [place], [place] [address]  Culture, blood (Routine X 2) w Reflex to ID Panel     Status: None (Preliminary result)   Collection Time: 09/17/21  5:05 AM   Specimen: BLOOD  Result Value Ref Range Status   Specimen Description BLOOD BLOOD RIGHT HAND   Final   Special Requests   Final    IN PEDIATRIC BOTTLE Blood Culture results may not be optimal due to an excessive volume of blood received in culture bottles   Culture   Final    NO GROWTH 1 DAY Performed at [facility], [address] [place], [place] [address]    Report Status PENDING  Incomplete    Radiology Studies: No results found.   LOS: 3 days   Antonieta Pert, MD [facility]  09/18/2021, 9:26 AM

## 2021-09-18 NOTE — Progress Notes (Signed)
Physical Therapy Treatment Patient Details Name: Becky Trevino MRN: [id number] DOB: [birth year] Today's Date: 09/18/2021   History of Present Illness 76 year old female presenting from home with past medical history of paroxysmal atrial fibrillation (on Eliquis), hyperlipidemia, hypertension, history of SVT, pulmonary hypertension, chronic kidney disease stage IIIB, non-insulin-dependent diabetes mellitus type 2, obesity, obstructive sleep apnea, chronic DVT of the right lower extremity status post IVC filter placement, lymphedema, hypothyroidism who presents to [facility] emergency department with complaints of fevers and weakness.  Presents with 3 days of fever, malaise noted to have left leg cellulitis improving with IV antibiotics as below.    PT Comments    Pt received in bed, completed strengthening exercises for B LE's. Supine to sit at EOB with ModA, HOB raised, and use of side rail. Pt sat EOB x 10 minutes with good tolerance. Sit to stand from very elevated bed and MaxA with use of RW. Pt tolerated ~20 seconds of standing time twice. Unable to attain upright posture or ability to weight shift significantly to advance a step. Pt assisted back to bed with MaxA of 2. Bed lowered, alarm on, all needs met. Overall good tolerance for skilled PT session.    Recommendations for follow up therapy are one component of a multi-disciplinary discharge planning process, led by the attending physician.  Recommendations may be updated based on patient status, additional functional criteria and insurance authorization.  Follow Up Recommendations  Skilled nursing-short term rehab (<3 hours/day)     Assistance Recommended at Discharge Frequent or constant Supervision/Assistance  Patient can return home with the following A lot of help with walking and/or transfers;A lot of help with bathing/dressing/bathroom;Assist for transportation   Equipment Recommendations  None recommended by PT    Recommendations for  Other Services       Precautions / Restrictions Precautions Precautions: Fall Restrictions Weight Bearing Restrictions: No     Mobility  Bed Mobility Overal bed mobility: Needs Assistance Bed Mobility: Supine to Sit;Sit to Supine     Supine to sit: Min guard;HOB elevated Sit to supine: Mod assist;+2 for physical assistance   General bed mobility comments:  (Pt sat EOB x 10  minutes)    Transfers Overall transfer level: Needs assistance Equipment used: Rolling walker (2 wheels) Transfers: Sit to/from Stand Sit to Stand: Max assist;From elevated surface           General transfer comment: Pt stood at [facility] for 20 seconds twice. Able to shuffle feet to R ~ 6 inches.    Ambulation/Gait                   Stairs             Wheelchair Mobility    Modified Rankin (Stroke Patients Only)       Balance Overall balance assessment: Needs assistance Sitting-balance support: Feet supported;No upper extremity supported Sitting balance-Leahy Scale: Fair                                      Cognition Arousal/Alertness: Awake/alert Behavior During Therapy: WFL for tasks assessed/performed Overall Cognitive Status: Within Functional Limits for tasks assessed                                 General Comments: A&Ox4.        Exercises General Exercises - Lower  Extremity Ankle Circles/Pumps: AROM;Both;15 reps Quad Sets: AROM;Both;10 reps Gluteal Sets: AROM;Both;10 reps Long Arc Quad: AROM;Left;AAROM;Right;10 reps    General Comments        Pertinent Vitals/Pain Pain Assessment: 0-10 Pain Score: 4  Breathing: normal Pain Location: R hip and knee Pain Descriptors / Indicators: Discomfort;Grimacing Pain Intervention(s): Monitored during session    Home Living                          Prior Function            PT Goals (current goals can now be found in the care plan section) Acute Rehab PT Goals Patient  Stated Goal: improve pain and go home    Frequency    Min 2X/week      PT Plan Current plan remains appropriate    Co-evaluation              AM-PAC PT "6 Clicks" Mobility   Outcome Measure  Help needed turning from your back to your side while in a flat bed without using bedrails?: A Lot Help needed moving from lying on your back to sitting on the side of a flat bed without using bedrails?: Total Help needed moving to and from a bed to a chair (including a wheelchair)?: Total Help needed standing up from a chair using your arms (e.g., wheelchair or bedside chair)?: A Lot Help needed to walk in hospital room?: Total Help needed climbing 3-5 steps with a railing? : Total 6 Click Score: 8    End of Session         PT Visit Diagnosis: Other abnormalities of gait and mobility (R26.89);Muscle weakness (generalized) (M62.81);Pain;Difficulty in walking, not elsewhere classified (R26.2) Pain - Right/Left: Right Pain - part of body: Hip     Time: 1340-1408 PT Time Calculation (min) (ACUTE ONLY): 28 min  Charges:  $Therapeutic Exercise: 8-22 mins $Therapeutic Activity: 8-22 mins                    Mikel Cella, PTA    [name] 09/18/2021, 4:10 PM

## 2021-09-18 NOTE — Progress Notes (Signed)
Occupational Therapy Treatment Patient Details Name: Becky Trevino MRN: [id number] DOB: 03/15/1946 Today's Date: 09/18/2021   History of present illness 76 year old female presenting from home with past medical history of paroxysmal atrial fibrillation (on Eliquis), hyperlipidemia, hypertension, history of SVT, pulmonary hypertension, chronic kidney disease stage IIIB, non-insulin-dependent diabetes mellitus type 2, obesity, obstructive sleep apnea, chronic DVT of the right lower extremity status post IVC filter placement, lymphedema, hypothyroidism who presents to [facility] emergency department with complaints of fevers and weakness.  Presents with 3 days of fever, malaise noted to have left leg cellulitis improving with IV antibiotics as below.   OT comments  Pt seen for OT treatment on this date. Upon arrival to room, pt declining to participate in mobility/seated ADLs at EOB d/t 9/10 leg pain following PT session (1 hour prior to OT session), however agreeable to bed-level ADLs and UE therapy exercises. Pt currently requires SET-UP assist for bed-level grooming tasks. Pt educated on importance of independently performing bed-level therapy exercises to prevent further deconditioning while admitted; pt verbalized understanding and performed x6 UE therapy exercises (see below for more information). Pt is making good progress toward goals and continues to benefit from skilled OT services to maximize return to PLOF and minimize risk of future falls, injury, caregiver burden, and readmission. Will continue to follow POC. Discharge recommendation remains appropriate.     Recommendations for follow up therapy are one component of a multi-disciplinary discharge planning process, led by the attending physician.  Recommendations may be updated based on patient status, additional functional criteria and insurance authorization.    Follow Up Recommendations  Skilled nursing-short term rehab (<3 hours/day)     Assistance Recommended at Discharge Intermittent Supervision/Assistance  Patient can return home with the following  Two people to help with walking and/or transfers;Two people to help with bathing/dressing/bathroom   Equipment Recommendations  Other (comment) (defer to next venue of care)       Precautions / Restrictions Precautions Precautions: Fall Restrictions Weight Bearing Restrictions: No       Mobility Bed Mobility Overal bed mobility: Needs Assistance Bed Mobility: Supine to Sit;Sit to Supine     Supine to sit: Min guard;HOB elevated Sit to supine: Mod assist;+2 for physical assistance   General bed mobility comments: pt declined to participate in bed mobility this date d/t signficant pain    Transfers Overall transfer level: Needs assistance Equipment used: Rolling walker (2 wheels) Transfers: Sit to/from Stand Sit to Stand: Max assist;From elevated surface           General transfer comment: pt declined to participate in OOB mobility this date d/t signficant pain     Balance Overall balance assessment: Needs assistance Sitting-balance support: Feet supported;No upper extremity supported Sitting balance-Leahy Scale: Fair                                     ADL either performed or assessed with clinical judgement   ADL Overall ADL's : Needs assistance/impaired     Grooming: Wash/dry face;Oral care;Applying deodorant;Set up;Bed level                                        Cognition Arousal/Alertness: Awake/alert Behavior During Therapy: WFL for tasks assessed/performed Overall Cognitive Status: Within Functional Limits for tasks assessed  General Comments: A&Ox4.          Exercises General Exercises - Upper Extremity Shoulder Horizontal ABduction: AAROM;Strengthening;Both;10 reps;Supine Shoulder Horizontal ADduction: AAROM;Strengthening;Both;Supine Elbow Flexion:  AAROM;Strengthening;Both;10 reps;Supine Elbow Extension: AAROM;Strengthening;Both;10 reps;Supine Wrist Flexion: AROM;10 reps;Supine Wrist Extension: Both;10 reps;Supine            Pertinent Vitals/ Pain       Pain Assessment: 0-10 Pain Score: 9 Breathing: normal Pain Location: R hip and knee Pain Descriptors / Indicators: Discomfort;Grimacing Pain Intervention(s): Monitored during session         Frequency  Min 2X/week        Progress Toward Goals  OT Goals(current goals can now be found in the care plan section)  Progress towards OT goals: Progressing toward goals  Acute Rehab OT Goals Patient Stated Goal: to have less pain OT Goal Formulation: With patient Time For Goal Achievement: 09/30/21 Potential to Achieve Goals: Good  Plan Discharge plan remains appropriate;Frequency remains appropriate       AM-PAC OT "6 Clicks" Daily Activity     Outcome Measure   Help from another person eating meals?: None Help from another person taking care of personal grooming?: A Little Help from another person toileting, which includes using toliet, bedpan, or urinal?: Total Help from another person bathing (including washing, rinsing, drying)?: A Lot Help from another person to put on and taking off regular upper body clothing?: A Little Help from another person to put on and taking off regular lower body clothing?: A Lot 6 Click Score: 15    End of Session    OT Visit Diagnosis: Muscle weakness (generalized) (M62.81);Pain Pain - Right/Left: Right Pain - part of body: Leg   Activity Tolerance Patient limited by pain   Patient Left in bed;with call bell/phone within reach;with bed alarm set   Nurse Communication Mobility status;Patient requests pain meds        Time: 1455-1518 OT Time Calculation (min): 23 min  Charges: OT General Charges $OT Visit: 1 Visit OT Treatments $Self Care/Home Management : 8-22 mins $Therapeutic Activity: 8-22 mins  Fredirick Maudlin, OTR/L [place]

## 2021-09-18 NOTE — Progress Notes (Signed)
ID Pt still has pain rt hip No fever No sob  Objective In bed Not in  distress BP 125/63 (BP Location: Left Arm)    Pulse 95    Temp 99.9 F (37.7 C)    Resp 18    Ht 5\' 11"  (1.803 m)    Wt 136.1 kg    SpO2 97%    BMI 41.84 kg/m   Chest b/l air entry [id number] Abd soft B/l edema extremities  Labs CBC Latest Ref Rng & Units 09/18/2021 09/17/2021 09/16/2021  WBC 4.0 - 10.5 K/uL 9.7 8.3 9.0  Hemoglobin 12.0 - 15.0 g/dL 9.4(L) 10.5(L) 10.3(L)  Hematocrit 36.0 - 46.0 % 29.1(L) 31.6(L) 33.0(L)  Platelets 150 - 400 K/uL 157 145(L) 121(L)    CMP Latest Ref Rng & Units 09/18/2021 09/17/2021 09/16/2021  Glucose 70 - 99 mg/dL 102(H) 102(H) 84  BUN 8 - 23 mg/dL 21 23 23   Creatinine 0.44 - 1.00 mg/dL 1.28(H) 1.28(H) 1.39(H)  Sodium 135 - 145 mmol/L 134(L) 134(L) 135  Potassium 3.5 - 5.1 mmol/L 3.7 3.6 4.1  Chloride 98 - 111 mmol/L 102 103 103  CO2 22 - 32 mmol/L 26 23 24   Calcium 8.9 - 10.3 mg/dL 8.6(L) 8.5(L) 8.7(L)  Total Protein 6.5 - 8.1 g/dL - - -  Total Bilirubin 0.3 - 1.2 mg/dL - - -  Alkaline Phos 38 - 126 U/L - - -  AST 15 - 41 U/L - - -  ALT 0 - 44 U/L - - -    Micro 09/15/21 group B strep 09/17/21 - BC-    Impression/recommendation pt presenting with fever/sepsis Group B streptococcal bacteremia- third episode in 24 months- I do not see any cellulitis of the left thigh currently- on ceftriaxone Repeat culture sent-   Initially it was the rt ankle  ORIF infection feb 2021 Then it was left thigh cellulitis in Nov 2021 This time could be the rt hip  She will need Aspiration of rt hip Needs TEE to look at heart valves Currently on IV ceftriaxone- will need for 6 weeks minimum followed by suppressive therapy with amoxicillin Discussed with Dr.Menz- Rt knee or RT ankle hardware is not infected and not the source for the bacteremia   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

## 2021-09-18 NOTE — Plan of Care (Signed)

## 2021-09-18 NOTE — Consult Note (Signed)
CARDIOLOGY CONSULT NOTE               Patient ID: Becky Trevino MRN: [id number] DOB/AGE: [birth year] 76 y.o.  Admit date: 09/14/2021 Referring Physician Antonieta Pert, MD Primary Physician [name], MD  Primary Cardiologist [facility], MD Reason for Consultation Recurrent bacteremia, TEE consideration  HPI: 76 year old female with a history of paroxysmal atrial fibrillation on amiodarone and Eliquis, pulmonary hypertension, hyperlipidemia, hypertension, diabetes, CKD stage III, chronic venous insufficiency, morbid obesity, sleep apnea, chronic DVT of right lower extremity status post IVC filter, and lymphedema. She also has a history of 3 episodes of GBS bacteremia in 24 months, MRSA bacteremia with left upper thigh cellulitis in 10/7251, complicated right knee prosthetic infection in 2019, on suppressive doxycycline, and recent admission for sepsis and shock from 11/1-11/01/2021. The patient presented to [facility] ER on 09/14/21 for fever and generalized weakness with worsening right hip pain. CT hip showed right hip effusion without fracture. Per orthopedics, her right knee and right ankle hardware are not the source of bacteremia. Admission labs notable for blood culture positive for GBS, CRP 36, procalcitonin 5.38, WBC 8.5. The patient currently denies chest pain, shortness of breath, or palpitations. She reports feeling "good."    Review of systems complete and found to be negative unless listed above     Past Medical History:  Diagnosis Date   Cervical spondylosis    Chronic kidney disease    Stage 3   DDD (degenerative disc disease), cervical    [facility]   Diabetes mellitus without complication (HCC)    Diverticulosis    Hyperlipidemia    Hypertension    Intertrigo    Leg cramps    Leg varices    Obesity    OSA (obstructive sleep apnea)    Symptomatic menopausal or female climacteric states    Syncope     Past Surgical History:  Procedure Laterality Date    ABDOMINAL HYSTERECTOMY  1993   Total   BUNIONECTOMY Bilateral 1993   I & D KNEE WITH POLY EXCHANGE Right 11/19/2017   Procedure: RIGHT KNEE POLY EXCHANGE WITH IRRIGATION AND DEBRIDEMENT;  Surgeon: Hessie Knows, MD;  Location: [facility] ORS;  Service: Orthopedics;  Laterality: Right;   JOINT REPLACEMENT     bilateral knee   LAMINECTOMY  11/14/2013    Cervical Fusion , [facility], Dr. Delilah Shan   LASER ABLATION Bilateral 07/29/2012   Dr. Lucky Cowboy   LOWER EXTREMITY ANGIOGRAPHY Right 11/02/2019   Procedure: Lower Extremity Angiography;  Surgeon: Katha Cabal, MD;  Location: [facility];  Service: Cardiovascular;  Laterality: Right;   ORIF ANKLE FRACTURE Right 09/02/2019   Procedure: OPEN REDUCTION INTERNAL FIXATION (ORIF) ANKLE FRACTURE BIMALLEOLAR;  Surgeon: [name], DPM;  Location: [facility];  Service: Podiatry;  Laterality: Right;   PERIPHERAL VASCULAR THROMBECTOMY Right 03/02/2020   Procedure: PERIPHERAL VASCULAR THROMBECTOMY;  Surgeon: Algernon Huxley, MD;  Location: [facility];  Service: Cardiovascular;  Laterality: Right;   REPLACEMENT TOTAL KNEE Left 03/2010   Dr. Rudene Christians   TOTAL HIP ARTHROPLASTY Left 12/11/2015   Procedure: TOTAL HIP ARTHROPLASTY ANTERIOR APPROACH;  Surgeon: Hessie Knows, MD;  Location: [facility];  Service: Orthopedics;  Laterality: Left;   TOTAL KNEE ARTHROPLASTY Right 03/11/2016   Procedure: TOTAL KNEE ARTHROPLASTY;  Surgeon: Hessie Knows, MD;  Location: [facility];  Service: Orthopedics;  Laterality: Right;    Medications Prior to Admission  Medication Sig Dispense Refill Last Dose   amiodarone (PACERONE) 200 MG tablet  Take 200 mg by mouth 2 (two) times daily.   09/14/2021   apixaban (ELIQUIS) 5 MG TABS tablet Take 1 tablet by mouth 2 (two) times daily.   09/14/2021   ASPIRIN LOW DOSE 81 MG EC tablet TAKE 1 TABLET BY MOUTH EVERY DAY 90 tablet 0 09/14/2021   atorvastatin (LIPITOR) 40 MG tablet Take 1 tablet (40 mg total) by mouth every evening. 90 tablet 1 09/14/2021    benzonatate (TESSALON) 100 MG capsule Take 1 capsule (100 mg total) by mouth 3 (three) times daily as needed. 20 capsule 0 prn at prn   Blood Glucose Monitoring Suppl (ACCU-CHEK GUIDE ME) w/Device KIT 1 Device by Does not apply route once for 1 dose. 1 kit 0    Calcium Carbonate-Vitamin D 600-400 MG-UNIT tablet Take 1 tablet by mouth 2 (two) times daily. 60 tablet 0 09/14/2021   clonazePAM (KLONOPIN) 0.5 MG tablet Take 1 tablet (0.5 mg total) by mouth 2 (two) times daily as needed for anxiety. Panic attack only 5 tablet 0 prn at prn   escitalopram (LEXAPRO) 5 MG tablet Take 1 tablet (5 mg total) by mouth daily. For anxiety, please wait until follow up with Dr. Clayborn Bigness to start this medication 30 tablet 1 09/14/2021   fluticasone (FLONASE) 50 MCG/ACT nasal spray SPRAY 2 SPRAYS INTO EACH NOSTRIL EVERY DAY 48 mL 0 09/14/2021   latanoprost (XALATAN) 0.005 % ophthalmic solution SMARTSIG:In Eye(s)   09/13/2021   levothyroxine (SYNTHROID) 25 MCG tablet TAKE 1 TABLET (25 MCG TOTAL) BY MOUTH DAILY AT 6 (SIX) AM. 90 tablet 1 09/14/2021   Magnesium Oxide 500 MG CAPS Take 500 mg by mouth daily.    09/14/2021   metoprolol succinate (TOPROL-XL) 50 MG 24 hr tablet Take 50 mg by mouth daily.   09/14/2021   mirabegron ER (MYRBETRIQ) 50 MG TB24 tablet Take 1 tablet (50 mg total) by mouth daily. 90 tablet 3 09/14/2021   Oxycodone HCl 10 MG TABS Take 10 mg by mouth every 6 (six) hours as needed.   prn at prn   polyethylene glycol (MIRALAX / GLYCOLAX) 17 g packet Take 17 g by mouth daily. 14 each 0 09/14/2021   potassium chloride (KLOR-CON) 10 MEQ tablet Take 10 mEq by mouth daily.   09/14/2021   SIMBRINZA 1-0.2 % SUSP Place 1 drop into both eyes 2 (two) times daily.   09/14/2021   sodium chloride (OCEAN) 0.65 % SOLN nasal spray Place 1 spray into both nostrils 3 (three) times daily. 30 mL 0 09/14/2021   torsemide (DEMADEX) 20 MG tablet TAKE 1 TABLET BY MOUTH EVERY DAY 90 tablet 1 09/14/2021   traZODone (DESYREL) 50 MG  tablet TAKE 1/2 TO 1 TABLET BY MOUTH AT BEDTIME AS NEEDED FOR SLEEP. 90 tablet 0 09/13/2021   acetaminophen (TYLENOL) 650 MG CR tablet Take 650-1,300 mg by mouth 2 (two) times daily as needed for pain.   prn at prn   Social History   Socioeconomic History   Marital status: Married    Spouse name: Not on file   Number of children: 1   Years of education: Not on file   Highest education level: Not on file  Occupational History   Not on file  Tobacco Use   Smoking status: Never   Smokeless tobacco: Never  Vaping Use   Vaping Use: Never used  Substance and Sexual Activity   Alcohol use: No    Alcohol/week: 0.0 standard drinks   Drug use: No   Sexual activity:  Yes    Partners: Male  Other Topics Concern   Not on file  Social History Narrative   Married, one adopted grown child    Social Determinants of Health   Financial Resource Strain: Low Risk    Difficulty of Paying Living Expenses: Not hard at all  Food Insecurity: No Food Insecurity   Worried About Charity fundraiser in the Last Year: Never true   Arboriculturist in the Last Year: Never true  Transportation Needs: No Transportation Needs   Lack of Transportation (Medical): No   Lack of Transportation (Non-Medical): No  Physical Activity: Insufficiently Active   Days of Exercise per Week: 2 days   Minutes of Exercise per Session: 30 min  Stress: No Stress Concern Present   Feeling of Stress : Only a little  Social Connections: Engineer, building services of Communication with Friends and Family: More than three times a week   Frequency of Social Gatherings with Friends and Family: More than three times a week   Attends Religious Services: More than 4 times per year   Active Member of [employer] or Organizations: Yes   Attends Music therapist: More than 4 times per year   Marital Status: Married  Human resources officer Violence: Not At Risk   Fear of Current or Ex-Partner: No   Emotionally Abused: No    Physically Abused: No   Sexually Abused: No    Family History  Problem Relation Age of Onset   Diabetes Mother    Hyperlipidemia Mother    Hypertension Mother    Diabetes Father    Hyperlipidemia Father    Hypertension Father    Obesity Father    Hypertension Sister    Hyperlipidemia Sister    Hyperlipidemia Sister    Breast cancer Neg Hx       Review of systems complete and found to be negative unless listed above      PHYSICAL EXAM  General: Well developed, well nourished, in no acute distress, sitting up in bed eating lunch HEENT:  Normocephalic and atramatic Neck:  No JVD.  Lungs: normal WOB on RA, no wheezing anteriorly  Heart: HRRR . Normal S1 and S2 without gallops or murmurs.  Abdomen: nondistended Neuro: Alert and oriented X 3. Psych:  Good affect, responds appropriately  Labs:   Lab Results  Component Value Date   WBC 9.7 09/18/2021   HGB 9.4 (L) 09/18/2021   HCT 29.1 (L) 09/18/2021   MCV 95.7 09/18/2021   PLT 157 09/18/2021    Recent Labs  Lab 09/15/21 0533 09/16/21 0312 09/18/21 0434  NA 132*   < > 134*  K 3.7   < > 3.7  CL 101   < > 102  CO2 22   < > 26  BUN 22   < > 21  CREATININE 1.31*   < > 1.28*  CALCIUM 8.8*   < > 8.6*  PROT 7.5  --   --   BILITOT 1.1  --   --   ALKPHOS 60  --   --   ALT 18  --   --   AST 21  --   --   GLUCOSE 100*   < > 102*   < > = values in this interval not displayed.   Lab Results  Component Value Date   CKTOTAL 133 07/18/2021   TROPONINI < 0.02 05/22/2012    Lab Results  Component Value Date  CHOL 157 02/04/2021   CHOL 152 01/27/2020   CHOL 148 11/25/2018   Lab Results  Component Value Date   HDL 73 02/04/2021   HDL 69 01/27/2020   HDL 73 11/25/2018   Lab Results  Component Value Date   LDLCALC 69 02/04/2021   LDLCALC 64 01/27/2020   LDLCALC 62 11/25/2018   Lab Results  Component Value Date   TRIG 71 02/04/2021   TRIG 103 01/27/2020   TRIG 51 11/25/2018   Lab Results  Component  Value Date   CHOLHDL 2.2 02/04/2021   CHOLHDL 2.2 01/27/2020   CHOLHDL 2.0 11/25/2018   No results found for: LDLDIRECT    Radiology: DG Chest 2 View  Result Date: 09/14/2021 CLINICAL DATA:  Fever. EXAM: CHEST - 2 VIEW COMPARISON:  July 15, 2021 FINDINGS: Decreased lung volumes are seen with mild areas of atelectasis and/or infiltrate noted within the bilateral lung bases. There is no evidence of a pleural effusion or pneumothorax. The heart size and mediastinal contours are within normal limits. Chronic deformities are seen involving the bilateral shoulders. Degenerative changes are seen within the visualized portion of the cervical spine. Multilevel degenerative changes are noted throughout the thoracic spine. IMPRESSION: Mild bibasilar atelectasis and/or infiltrate. Electronically Signed   By: Virgina Norfolk M.D.   On: 09/14/2021 19:39   CT CHEST WO CONTRAST  Result Date: 09/14/2021 CLINICAL DATA:  Fever of unknown origin. EXAM: CT CHEST WITHOUT CONTRAST TECHNIQUE: Multidetector CT imaging of the chest was performed following the standard protocol without IV contrast. COMPARISON:  February 23, 2021 FINDINGS: Cardiovascular: There is mild calcification of the aortic arch, without evidence of aortic aneurysm. Normal heart size with moderate severity coronary artery calcification. No pericardial effusion. Mediastinum/Nodes: A 2.3 cm x 1.7 cm axillary lymph node is suspected (axial CT image 21, CT series 2). A stable, similar appearing 3.0 cm x 2.4 cm low-attenuation soft tissue mass is seen in medial to the left glenoid (axial CT image 1, CT series 2). Thyroid gland, trachea, and esophagus demonstrate no significant findings. Lungs/Pleura: Mild atelectasis is seen within the posterior aspects of the bilateral lower lobes. There is no evidence of a pleural effusion or pneumothorax. Upper Abdomen: No acute abnormality. Musculoskeletal: Marked severity degenerative changes are seen involving the right  shoulder, with a large chronic appearing deformity seen involving the left humeral head and left glenoid. Multilevel degenerative changes seen throughout the thoracic spine. IMPRESSION: 1. Mild bilateral lower lobe atelectasis. 2. Moderate severity coronary artery calcification. 3. Marked severity degenerative changes involving the right shoulder, with a large chronic appearing deformity involving the left humeral head and left glenoid. 4. Aortic atherosclerosis. Aortic Atherosclerosis (ICD10-I70.0). Electronically Signed   By: Virgina Norfolk M.D.   On: 09/14/2021 22:33   CT Hip Right Wo Contrast  Result Date: 09/14/2021 CLINICAL DATA:  Right hip pain and fever. EXAM: CT OF THE RIGHT HIP WITHOUT CONTRAST TECHNIQUE: Multidetector CT imaging of the right hip was performed according to the standard protocol. Multiplanar CT image reconstructions were also generated. COMPARISON:  None. FINDINGS: Bones/Joint/Cartilage There is no evidence of acute fracture or dislocation. 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 Marked severity chronic and degenerative changes are seen involving the right hip. This is in the form of joint space narrowing, acetabular sclerosis and subchondral cyst formation. Mild flattening of the right humeral head is also seen. Ligaments Suboptimally assessed by CT. Muscles and Tendons Unremarkable Soft tissues A right hip effusion is noted.  IMPRESSION: 1. No evidence of an acute fracture or dislocation. 2. Marked severity chronic and degenerative changes involving the right hip. 3. Right hip effusion. Electronically Signed   By: Virgina Norfolk M.D.   On: 09/14/2021 22:40      ASSESSMENT AND PLAN:  Recurrent GBS bacteremia, unclear etiology, TEE recommended by infectious disease. Paroxysmal atrial fibrillation, on Eliquis, amiodarone, and metoprolol. Rate-controlled, asymptomatic.  Plan: Plan to proceed with TEE on 09/19/20 at 8:30 AM, performed by  Dr. Nehemiah Massed Continue Eliquis, amiodarone, and metoprolol   Signed: Clabe Seal PA-C 09/18/2021, 3:35 PM

## 2021-09-19 ENCOUNTER — Encounter
Admission: EM | Disposition: A | Discharge status: Skilled Nursing Facility | Payer: Self-pay | Source: Home / Self Care | Attending: Internal Medicine

## 2021-09-19 ENCOUNTER — Encounter: Payer: Self-pay | Admitting: Anesthesiology

## 2021-09-19 ENCOUNTER — Inpatient Hospital Stay: Payer: Medicare Other | Admitting: Radiology

## 2021-09-19 ENCOUNTER — Inpatient Hospital Stay
Admit: 2021-09-19 | Discharge: 2021-09-19 | Disposition: A | Discharge status: Home / Self Care | Payer: Medicare Other | Attending: Infectious Diseases | Admitting: Infectious Diseases

## 2021-09-19 ENCOUNTER — Inpatient Hospital Stay
Admit: 2021-09-19 | Discharge: 2021-09-19 | Disposition: A | Discharge status: Home / Self Care | Payer: Medicare Other | Attending: Physician Assistant | Admitting: Physician Assistant

## 2021-09-19 ENCOUNTER — Inpatient Hospital Stay: Admit: 2021-09-19 | Payer: Medicare Other

## 2021-09-19 DIAGNOSIS — B951 Streptococcus, group B, as the cause of diseases classified elsewhere: Secondary | ICD-10-CM | POA: Diagnosis not present

## 2021-09-19 DIAGNOSIS — R7881 Bacteremia: Secondary | ICD-10-CM | POA: Diagnosis not present

## 2021-09-19 HISTORY — PX: IR FLUORO GUIDED NEEDLE PLC ASPIRATION/INJECTION LOC: IMG2395

## 2021-09-19 HISTORY — PX: TEE WITHOUT CARDIOVERSION: SHX5443

## 2021-09-19 LAB — GLUCOSE, CAPILLARY
Glucose-Capillary: 97 mg/dL (ref 70–99)
Glucose-Capillary: 76 mg/dL (ref 70–99)
Glucose-Capillary: 89 mg/dL (ref 70–99)
Glucose-Capillary: 98 mg/dL (ref 70–99)
Glucose-Capillary: 92 mg/dL (ref 70–99)

## 2021-09-19 LAB — BASIC METABOLIC PANEL
Anion gap: 10 (ref 5–15)
BUN: 20 mg/dL (ref 8–23)
CO2: 25 mmol/L (ref 22–32)
Calcium: 8.5 mg/dL — ABNORMAL LOW (ref 8.9–10.3)
Chloride: 99 mmol/L (ref 98–111)
Creatinine, Ser: 1.06 mg/dL — ABNORMAL HIGH (ref 0.44–1.00)
GFR, Estimated: 55 mL/min — ABNORMAL LOW (ref >60)
Glucose, Bld: 91 mg/dL (ref 70–99)
Potassium: 3.8 mmol/L (ref 3.5–5.1)
Sodium: 134 mmol/L — ABNORMAL LOW (ref 135–145)

## 2021-09-19 LAB — PROTIME-INR
INR: 1.8 — ABNORMAL HIGH (ref 0.8–1.2)
Prothrombin Time: 20.8 seconds — ABNORMAL HIGH (ref 11.4–15.2)

## 2021-09-19 LAB — CBC
HCT: 27.8 % — ABNORMAL LOW (ref 36.0–46.0)
Hemoglobin: 8.9 g/dL — ABNORMAL LOW (ref 12.0–15.0)
MCH: 30.4 pg (ref 26.0–34.0)
MCHC: 32 g/dL (ref 30.0–36.0)
MCV: 94.9 fL (ref 80.0–100.0)
Platelets: 199 10*3/uL (ref 150–400)
RBC: 2.93 MIL/uL — ABNORMAL LOW (ref 3.87–5.11)
RDW: 18.2 % — ABNORMAL HIGH (ref 11.5–15.5)
WBC: 9.2 10*3/uL (ref 4.0–10.5)
nRBC: 0.3 % — ABNORMAL HIGH (ref 0.0–0.2)

## 2021-09-19 LAB — SYNOVIAL CELL COUNT + DIFF, W/ CRYSTALS
Crystals, Fluid: NONE SEEN
Eosinophils-Synovial: 0 %
Lymphocytes-Synovial Fld: 5 %
Monocyte-Macrophage-Synovial Fluid: 0 %
Neutrophil, Synovial: 95 %
WBC, Synovial: 4000 /mm3 — ABNORMAL HIGH (ref 0–200)

## 2021-09-19 LAB — ECHOCARDIOGRAM COMPLETE
AR max vel: 3.27 cm2
AV Area VTI: 3.04 cm2
AV Area mean vel: 3.08 cm2
AV Mean grad: 5.5 mmHg
AV Peak grad: 9.5 mmHg
Ao pk vel: 1.55 m/s
Area-P 1/2: 3.36 cm2
Height: 71 in
MV VTI: 3.48 cm2
S' Lateral: 2.4 cm
Weight: 4800 oz

## 2021-09-19 LAB — APTT
aPTT: 35 seconds (ref 24–36)
aPTT: 77 seconds — ABNORMAL HIGH (ref 24–36)

## 2021-09-19 SURGERY — ECHOCARDIOGRAM, TRANSESOPHAGEAL
Anesthesia: Choice

## 2021-09-19 MED ORDER — SODIUM CHLORIDE FLUSH 0.9 % IV SOLN
INTRAVENOUS | Status: AC
  Filled 2021-09-19: qty 10

## 2021-09-19 MED ORDER — BUTAMBEN-TETRACAINE-BENZOCAINE 2-2-14 % EX AERO
INHALATION_SPRAY | CUTANEOUS | Status: AC
  Filled 2021-09-19: qty 5

## 2021-09-19 MED ORDER — HEPARIN (PORCINE) 25000 UT/250ML-% IV SOLN
1400.0000 [IU]/h | INTRAVENOUS | Status: DC
  Administered 2021-09-19 – 2021-09-20 (×2): 1400 [IU]/h via INTRAVENOUS
  Filled 2021-09-19 (×2): qty 250

## 2021-09-19 MED ORDER — BLISTEX MEDICATED EX OINT
TOPICAL_OINTMENT | CUTANEOUS | Status: DC | PRN
  Filled 2021-09-19: qty 6.3

## 2021-09-19 MED ORDER — FENTANYL CITRATE (PF) 100 MCG/2ML IJ SOLN
INTRAMUSCULAR | Status: AC | PRN
  Administered 2021-09-19: 50 ug via INTRAVENOUS

## 2021-09-19 MED ORDER — MIDAZOLAM HCL 2 MG/2ML IJ SOLN
INTRAMUSCULAR | Status: AC
  Filled 2021-09-19: qty 4

## 2021-09-19 MED ORDER — LIDOCAINE VISCOUS HCL 2 % MT SOLN
OROMUCOSAL | Status: AC
  Filled 2021-09-19: qty 15

## 2021-09-19 MED ORDER — LIDOCAINE HCL 1 % IJ SOLN
INTRAMUSCULAR | Status: AC
  Administered 2021-09-19: 3 mL
  Filled 2021-09-19: qty 20

## 2021-09-19 MED ORDER — HEPARIN BOLUS VIA INFUSION
4000.0000 [IU] | Freq: Once | INTRAVENOUS | Status: AC
  Administered 2021-09-19: 4000 [IU] via INTRAVENOUS
  Filled 2021-09-19: qty 4000

## 2021-09-19 MED ORDER — MIDAZOLAM HCL 2 MG/2ML IJ SOLN
INTRAMUSCULAR | Status: AC | PRN
  Administered 2021-09-19: 2 mg via INTRAVENOUS

## 2021-09-19 MED ORDER — FENTANYL CITRATE (PF) 100 MCG/2ML IJ SOLN
INTRAMUSCULAR | Status: AC
  Filled 2021-09-19: qty 2

## 2021-09-19 NOTE — Progress Notes (Signed)
OT Cancellation Note  Patient Details Name: Becky Trevino MRN: [id number] DOB: 10-02-1945   Cancelled Treatment:    Reason Eval/Treat Not Completed: Patient at procedure or test/ unavailable. Pt out of the room for a procedure. Will re-attempt at later date/time as pt is available and medically appropriate.   Ardeth Perfect., MPH, MS, OTR/L ascom [phone] 09/19/21, 12:35 PM

## 2021-09-19 NOTE — Procedures (Signed)
Interventional Radiology Procedure Note  Date of Procedure: 09/19/2021  Procedure: Right hip aspiration   Findings:  1. Right hip aspiration with 103ml serosanguinous fluid removed    Complications: No immediate complications noted.   Estimated Blood Loss: minimal  Follow-up and Recommendations: 1. None    Albin Felling, MD  Vascular & Interventional Radiology  09/19/2021 10:15 AM

## 2021-09-19 NOTE — Progress Notes (Signed)
PROGRESS NOTE    Becky Trevino  UQJ:[id number] DOB: 1946/07/22 DOA: 09/14/2021 PCP: [name], MD   Chief Complaint  Patient presents with   Fever  Brief Narrative/Hospital Course: Becky Trevino, 76 y.o. female with PMH of  paroxysmal atrial fibrillation (on Eliquis), hyperlipidemia, hypertension, history of SVT, pulmonary hypertension, chronic kidney disease stage IIIB, non-insulin-dependent diabetes mellitus type 2, obesity, obstructive sleep apnea, chronic DVT of the right lower extremity status post IVC filter placement, lymphedema, hypothyroidism who presents to [facility] ED with fever, weakness of 3 days and found to have left leg cellulitis, ID following.     Subjective: Seen and examined this morning. Patient afebrile overnight Blood pressure stable on room air  creat improved 1.0 On RA Underwent CT-guided aspiration Complains of similar pain in right hip and right knee.  Assessment & Plan:  Recurrent Group B streptococcus bacteremia: Third episode in past 24 months no cellulitis of the left thigh currently.  Initially had a right ankle ORIF infection in February 2021 then left thigh cellulitis in November 2021.  ID on board-suspecting right hip as the source ID planning for aspiration and TEE-IR for eval and management  s/p CT-guided aspiration rt hip this am cardiology is consulted for TEE. Orthopedic on board-Dr Rudene Christians.  Cont with ceftriaxone will need 6 weeks minimum followed by suppressive therapy with amoxicillin.  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  Rule out sundowning polypharmacy  AKI on CKD stage IIIb: Creatinine improved to 1.2 on presentation 1.5.  Monitor Recent Labs  Lab 09/15/21 0533 09/16/21 0312 09/17/21 0505 09/18/21 0434 09/19/21 0447  BUN 22 23 23 21 20   CREATININE  1.31* 1.39* 1.28* 1.28* 1.06*    Left THA clinically no evidence of infection History of cellulitis and MRSA bacteremia and GBS history of bacteremia and December 2021, history of pelvic/gluteal hematoma after falling November 2022  Anemia of chronic disease hemoglobin stable 9 to 10 g range.  Continue to monitor  A. fib on Eliquis and amiodarone and metoprolol, rate is controlled.  Consult pharmacy to transition to heparin infusion , hold eliquis this am. Resume eliquis soon once no more procedures.  Essential hypertension: Stable on metoprolol. HLD on Lipitor T2DM with CKD without long-term insulin: HbA1c stable 5.4,DM is controlled.  Keep on SSI. Recent Labs  Lab 09/18/21 0731 09/18/21 1214 09/18/21 1640 09/18/21 2049 09/19/21 0725  GLUCAP 116* 109* 104* 119* 97    Hypothyroidism cont Synthroid  Ambulatory dysfunction: Continue PT OT eval will need a skilled nursing facility.  Elevated troponin in the setting of dehydration ACS ruled out likely due to AKI and dehydration not changes of ischemia Constipation continue Dulcolax PR/laxatives Lymphedema bilateral lower extremity nonpitting Class III Obesity:Patient's Body mass index is 41.84 kg/m. : Will benefit with PCP follow-up, weight loss  healthy lifestyle and outpatient sleep evaluation.  DVT prophylaxis: heparin bolus via infusion 4,000 Units Start: 09/19/21 0900eliquis Code Status:   Code Status: Full Code Family Communication: plan of care discussed with patient at bedside. Status is: Inpatient Remains inpatient appropriate because: For ongoing work-up of bacteremia Disposition: Currently not medically stable for discharge. Anticipated Disposition: SNF   Objective: Vitals last 24 hrs: Vitals:   09/18/21 1946 09/19/21 0005 09/19/21 0403 09/19/21 0717  BP: 125/63 105/60 123/69 (!) 103/55  Pulse: 95 89 83 80  Resp: 18 18 18 17   Temp: 99.9 F (37.7 C) 98.3 F (36.8 C) 98 F (36.7 C) 99 F (37.2 C)  TempSrc:       SpO2: 97% 92% 93% 97%  Weight:      Height:       Weight change:   Intake/Output Summary (Last 24 hours) at 09/19/2021 1201 Last data filed at 09/18/2021 2300 Gross per 24 hour  Intake 360 ml  Output 500 ml  Net -140 ml   Net IO Since Admission: -578.8 mL [09/19/21 1201]   Physical Examination: General exam: AAOx 3, older than stated age, weak appearing. HEENT:Oral mucosa moist, Ear/Nose WNL grossly, dentition normal. Respiratory system: bilaterally clear, no use of accessory muscle Cardiovascular system: S1 & S2 +, No JVD,. Gastrointestinal system: Abdomen soft,obese NT,ND, BS+ Nervous System:Alert, awake, moving extremities and grossly nonfocal Extremities: Bilateral lymphedema  Skin: No rashes,no icterus. MSK: Normal muscle bulk,tone, power   Medications reviewed:  Scheduled Meds:  [MAR Hold] amiodarone  200 mg Oral BID   [MAR Hold] aspirin EC  81 mg Oral Daily   [MAR Hold] atorvastatin  40 mg Oral QPM   [MAR Hold] brinzolamide  1 drop Both Eyes TID   And   [MAR Hold] brimonidine  1 drop Both Eyes TID   butamben-tetracaine-benzocaine       [MAR Hold] escitalopram  5 mg Oral Daily   [MAR Hold] fluticasone  2 spray Each Nare Daily   [MAR Hold] heparin  4,000 Units Intravenous Once   [MAR Hold] insulin aspart  0-15 Units Subcutaneous TID AC & HS   [MAR Hold] latanoprost  1 drop Both Eyes QHS   [MAR Hold] levothyroxine  25 mcg Oral Q0600   lidocaine       [MAR Hold] metoprolol succinate  25 mg Oral BID   [MAR Hold] mirabegron ER  50 mg Oral Daily   sodium chloride flush       Continuous Infusions:  [MAR Hold] sodium chloride Stopped (09/18/21 0603)   sodium chloride 20 mL/hr at 09/18/21 1816   [MAR Hold] cefTRIAXone (ROCEPHIN)  IV 2 g (09/19/21 0518)   heparin      Diet Order             Diet NPO time specified  Diet effective now                          Weight change:   Wt Readings from Last 3 Encounters:  09/14/21 136.1 kg  07/23/21 128.4 kg   07/19/21 128.5 kg     Consultants:see note  Procedures:see note Antimicrobials: Anti-infectives (From admission, onward)    Start     Dose/Rate Route Frequency Ordered Stop   09/15/21 0300  [MAR Hold]  cefTRIAXone (ROCEPHIN) 2 g in sodium chloride 0.9 % 100 mL IVPB        (MAR Hold since Thu 09/19/2021 at 1156.Hold Reason: Transfer to a Procedural area)   2 g 200 mL/hr over 30 Minutes Intravenous Daily 09/15/21 0152 09/22/21 0559      Culture/Microbiology    Component Value Date/Time   SDES BLOOD BLOOD RIGHT HAND 09/17/2021 0505   SPECREQUEST  09/17/2021 0505    IN PEDIATRIC BOTTLE Blood Culture results may not be optimal due to an excessive volume of blood received in culture bottles   CULT  09/17/2021 0505    NO GROWTH 2 DAYS Performed at [facility], [address] [place],  [place] [address]    REPTSTATUS PENDING 09/17/2021 0505    Other culture-see note  Unresulted Labs (From admission, onward)     Start     Ordered   09/20/21 0500  Heparin level (unfractionated)  Tomorrow morning,   R       Question:  Specimen collection method  Answer:  Lab=Lab collect   09/19/21 0930   09/19/21 1800  APTT  Once-Timed,   TIMED       Question:  Specimen collection method  Answer:  Lab=Lab collect   09/19/21 0930   09/19/21 1017  Aerobic/Anaerobic Culture w Gram Stain (surgical/deep wound)  Once,   R       Comments: Right hip aspirate, concern for septic joint    09/19/21 1017   09/16/21 0500  CBC  Daily,   R     Question:  Specimen collection method  Answer:  Lab=Lab collect   09/15/21 1253   09/16/21 7510  Basic metabolic panel  Daily,   R     Question:  Specimen collection method  Answer:  Lab=Lab collect   09/15/21 1253           Data Reviewed: I have personally reviewed following labs and imaging studies CBC: Recent Labs  Lab 09/14/21 1657 09/15/21 0533 09/16/21 0312 09/17/21 0505 09/18/21 0434 09/19/21 0447  WBC 8.5 8.6 9.0 8.3 9.7 9.2  NEUTROABS  7.6 7.3  --   --   --   --   HGB 12.0 11.3* 10.3* 10.5* 9.4* 8.9*  HCT 37.3 34.6* 33.0* 31.6* 29.1* 27.8*  MCV 98.2 96.1 98.8 96.0 95.7 94.9  PLT 137* 126* 121* 145* 157 258   Basic Metabolic Panel: Recent Labs  Lab 09/15/21 0533 09/16/21 0312 09/17/21 0505 09/18/21 0434 09/19/21 0447  NA 132* 135 134* 134* 134*  K 3.7 4.1 3.6 3.7 3.8  CL 101 103 103 102 99  CO2 22 24 23 26 25   GLUCOSE 100* 84 102* 102* 91  BUN 22 23 23 21 20   CREATININE 1.31* 1.39* 1.28* 1.28* 1.06*  CALCIUM 8.8* 8.7* 8.5* 8.6* 8.5*  MG 2.1  --   --   --   --    GFR: Estimated Creatinine Clearance: 70.1 mL/min (A) (by C-G formula based on SCr of 1.06 mg/dL (H)). Liver Function Tests: Recent Labs  Lab 09/14/21 1657 09/15/21 0533  AST 29 21  ALT 20 18  ALKPHOS 66 60  BILITOT 1.1 1.1  PROT 8.2* 7.5  ALBUMIN 3.7 3.2*   No results for input(s): LIPASE, AMYLASE in the last 168 hours. No results for input(s): AMMONIA in the last 168 hours. Coagulation Profile: Recent Labs  Lab 09/19/21 0821  INR 1.8*   Cardiac Enzymes: No results for input(s): CKTOTAL, CKMB, CKMBINDEX, TROPONINI in the last 168 hours. BNP (last 3 results) No results for input(s): PROBNP in the last 8760 hours. HbA1C: No results for input(s): HGBA1C in the last 72 hours.  CBG: Recent Labs  Lab 09/18/21 0731 09/18/21 1214 09/18/21 1640 09/18/21 2049 09/19/21 0725  GLUCAP 116* 109* 104* 119* 97   Lipid Profile: No results for input(s): CHOL, HDL, LDLCALC, TRIG, CHOLHDL, LDLDIRECT in the last 72 hours. Thyroid Function Tests: No results for input(s): TSH, T4TOTAL, FREET4, T3FREE, THYROIDAB in the last 72 hours. Anemia Panel: No results for input(s): VITAMINB12, FOLATE, FERRITIN, TIBC, IRON, RETICCTPCT in the last 72 hours. Sepsis Labs: Recent Labs  Lab 09/14/21 1657 09/14/21 1943 09/15/21 2211 09/16/21 0137  PROCALCITON 5.38  --   --   --  LATICACIDVEN 1.6 1.6 1.3 1.4    Recent Results (from the past 240 hour(s))   Resp Panel by RT-PCR (Flu A&B, Covid) Nasopharyngeal Swab     Status: None   Collection Time: 09/14/21  4:57 PM   Specimen: Nasopharyngeal Swab; Nasopharyngeal(NP) swabs in vial transport medium  Result Value Ref Range Status   SARS Coronavirus 2 by RT PCR NEGATIVE NEGATIVE Final    Comment: (NOTE) SARS-CoV-2 target nucleic acids are NOT DETECTED.  The SARS-CoV-2 RNA is generally detectable in upper respiratory specimens during the acute phase of infection. The lowest concentration of SARS-CoV-2 viral copies this assay can detect is 138 copies/mL. A negative result does not preclude SARS-Cov-2 infection and should not be used as the sole basis for treatment or other patient management decisions. A negative result may occur with  improper specimen collection/handling, submission of specimen other than nasopharyngeal swab, presence of viral mutation(s) within the areas targeted by this assay, and inadequate number of viral copies(<138 copies/mL). A negative result must be combined with clinical observations, patient history, and epidemiological information. The expected result is Negative.  Fact Sheet for Patients:  EntrepreneurPulse.com.au  Fact Sheet for Healthcare Providers:  IncredibleEmployment.be  This test is no t yet approved or cleared by the [place] FDA and  has been authorized for detection and/or diagnosis of SARS-CoV-2 by FDA under an Emergency Use Authorization (EUA). This EUA will remain  in effect (meaning this test can be used) for the duration of the COVID-19 declaration under Section 564(b)(1) of the Act, 21 U.S.C.section 360bbb-3(b)(1), unless the authorization is terminated  or revoked sooner.       Influenza A by PCR NEGATIVE NEGATIVE Final   Influenza B by PCR NEGATIVE NEGATIVE Final    Comment: (NOTE) The Xpert Xpress SARS-CoV-2/FLU/RSV plus assay is intended as an aid in the diagnosis of influenza from  Nasopharyngeal swab specimens and should not be used as a sole basis for treatment. Nasal washings and aspirates are unacceptable for Xpert Xpress SARS-CoV-2/FLU/RSV testing.  Fact Sheet for Patients: EntrepreneurPulse.com.au  Fact Sheet for Healthcare Providers: IncredibleEmployment.be  This test is not yet approved or cleared by the [place] FDA and has been authorized for detection and/or diagnosis of SARS-CoV-2 by FDA under an Emergency Use Authorization (EUA). This EUA will remain in effect (meaning this test can be used) for the duration of the COVID-19 declaration under Section 564(b)(1) of the Act, 21 U.S.C. section 360bbb-3(b)(1), unless the authorization is terminated or revoked.  Performed at [facility], [address] [place], [place] [address]   Culture, blood (Routine X 2) w Reflex to ID Panel     Status: Abnormal   Collection Time: 09/14/21 11:15 PM   Specimen: BLOOD  Result Value Ref Range Status   Specimen Description   Final    BLOOD RIGHT ANTECUBITAL Performed at [facility], [address] [place], [place] [address]    Special Requests   Final    BOTTLES DRAWN AEROBIC AND ANAEROBIC Blood Culture results may not be optimal due to an excessive volume of blood received in culture bottles Performed at [facility], [address], [place], [place] [address]    Culture  Setup Time   Final    GRAM POSITIVE COCCI IN BOTH AEROBIC AND ANAEROBIC BOTTLES CRITICAL VALUE NOTED.  VALUE IS CONSISTENT WITH PREVIOUSLY REPORTED AND CALLED VALUE. Performed at [facility], [address] [place], [place] [address]    Culture (A)  Final  STREPTOCOCCUS AGALACTIAE SUSCEPTIBILITIES PERFORMED ON PREVIOUS CULTURE WITHIN THE LAST 5 DAYS. STAPHYLOCOCCUS CAPITIS THE SIGNIFICANCE OF ISOLATING THIS ORGANISM FROM A SINGLE SET OF BLOOD CULTURES WHEN MULTIPLE SETS ARE DRAWN IS UNCERTAIN. PLEASE  NOTIFY THE MICROBIOLOGY DEPARTMENT WITHIN ONE WEEK IF SPECIATION AND SENSITIVITIES ARE REQUIRED. Performed at [facility], [place] [address], [place], [place] [address]    Report Status 09/18/2021 FINAL  Final  Resp Panel by RT-PCR (Flu A&B, Covid) Nasopharyngeal Swab     Status: None   Collection Time: 09/14/21 11:15 PM   Specimen: Nasopharyngeal Swab; Nasopharyngeal(NP) swabs in vial transport medium  Result Value Ref Range Status   SARS Coronavirus 2 by RT PCR NEGATIVE NEGATIVE Final    Comment: (NOTE) SARS-CoV-2 target nucleic acids are NOT DETECTED.  The SARS-CoV-2 RNA is generally detectable in upper respiratory specimens during the acute phase of infection. The lowest concentration of SARS-CoV-2 viral copies this assay can detect is 138 copies/mL. A negative result does not preclude SARS-Cov-2 infection and should not be used as the sole basis for treatment or other patient management decisions. A negative result may occur with  improper specimen collection/handling, submission of specimen other than nasopharyngeal swab, presence of viral mutation(s) within the areas targeted by this assay, and inadequate number of viral copies(<138 copies/mL). A negative result must be combined with clinical observations, patient history, and epidemiological information. The expected result is Negative.  Fact Sheet for Patients:  EntrepreneurPulse.com.au  Fact Sheet for Healthcare Providers:  IncredibleEmployment.be  This test is no t yet approved or cleared by the [place] FDA and  has been authorized for detection and/or diagnosis of SARS-CoV-2 by FDA under an Emergency Use Authorization (EUA). This EUA will remain  in effect (meaning this test can be used) for the duration of the COVID-19 declaration under Section 564(b)(1) of the Act, 21 U.S.C.section 360bbb-3(b)(1), unless the authorization is terminated  or revoked sooner.        Influenza A by PCR NEGATIVE NEGATIVE Final   Influenza B by PCR NEGATIVE NEGATIVE Final    Comment: (NOTE) The Xpert Xpress SARS-CoV-2/FLU/RSV plus assay is intended as an aid in the diagnosis of influenza from Nasopharyngeal swab specimens and should not be used as a sole basis for treatment. Nasal washings and aspirates are unacceptable for Xpert Xpress SARS-CoV-2/FLU/RSV testing.  Fact Sheet for Patients: EntrepreneurPulse.com.au  Fact Sheet for Healthcare Providers: IncredibleEmployment.be  This test is not yet approved or cleared by the [place] FDA and has been authorized for detection and/or diagnosis of SARS-CoV-2 by FDA under an Emergency Use Authorization (EUA). This EUA will remain in effect (meaning this test can be used) for the duration of the COVID-19 declaration under Section 564(b)(1) of the Act, 21 U.S.C. section 360bbb-3(b)(1), unless the authorization is terminated or revoked.  Performed at [facility], [address] [place], [place] [address]   Culture, blood (Routine X 2) w Reflex to ID Panel     Status: Abnormal   Collection Time: 09/15/21  2:26 AM   Specimen: BLOOD  Result Value Ref Range Status   Specimen Description   Final    BLOOD BLOOD RIGHT HAND Performed at [facility], [address] [place], [place] [address]    Special Requests   Final    BOTTLES DRAWN AEROBIC AND ANAEROBIC Blood Culture adequate volume Performed at [facility], [address] [place], [place] [address]    Culture  Setup Time   Final  Organism ID to follow GRAM POSITIVE COCCI IN BOTH AEROBIC AND ANAEROBIC BOTTLES CRITICAL RESULT CALLED TO, READ BACK BY AND VERIFIED WITHLloyd Huger PHARMD [id number] 09/16/21 HNM Performed at [facility], [address] [place], [place] [address]    Culture GROUP B STREP(S.AGALACTIAE)ISOLATED (A)  Final   Report Status 09/18/2021 FINAL  Final   Organism  ID, Bacteria GROUP B STREP(S.AGALACTIAE)ISOLATED  Final      Susceptibility   Group b strep(s.agalactiae)isolated - MIC*    CLINDAMYCIN >=1 RESISTANT Resistant     AMPICILLIN <=0.25 SENSITIVE Sensitive     ERYTHROMYCIN >=8 RESISTANT Resistant     VANCOMYCIN 0.5 SENSITIVE Sensitive     CEFTRIAXONE <=0.12 SENSITIVE Sensitive     LEVOFLOXACIN 1 SENSITIVE Sensitive     * GROUP B STREP(S.AGALACTIAE)ISOLATED  Blood Culture ID Panel (Reflexed)     Status: Abnormal   Collection Time: 09/15/21  2:26 AM  Result Value Ref Range Status   Enterococcus faecalis NOT DETECTED NOT DETECTED Final   Enterococcus Faecium NOT DETECTED NOT DETECTED Final   Listeria monocytogenes NOT DETECTED NOT DETECTED Final   Staphylococcus species NOT DETECTED NOT DETECTED Final   Staphylococcus aureus (BCID) NOT DETECTED NOT DETECTED Final   Staphylococcus epidermidis NOT DETECTED NOT DETECTED Final   Staphylococcus lugdunensis NOT DETECTED NOT DETECTED Final   Streptococcus species DETECTED (A) NOT DETECTED Final    Comment: CRITICAL RESULT CALLED TO, READ BACK BY AND VERIFIED WITH: NATHAN BELUE PHARMD 1017 09/16/21 HNM    Streptococcus agalactiae DETECTED (A) NOT DETECTED Final    Comment: CRITICAL RESULT CALLED TO, READ BACK BY AND VERIFIED WITH: NATHAN BELUE PHARMD [id number] 09/16/21 HNM    Streptococcus pneumoniae NOT DETECTED NOT DETECTED Final   Streptococcus pyogenes NOT DETECTED NOT DETECTED Final   A.calcoaceticus-baumannii NOT DETECTED NOT DETECTED Final   Bacteroides fragilis NOT DETECTED NOT DETECTED Final   Enterobacterales NOT DETECTED NOT DETECTED Final   Enterobacter cloacae complex NOT DETECTED NOT DETECTED Final   Escherichia coli NOT DETECTED NOT DETECTED Final   Klebsiella aerogenes NOT DETECTED NOT DETECTED Final   Klebsiella oxytoca NOT DETECTED NOT DETECTED Final   Klebsiella pneumoniae NOT DETECTED NOT DETECTED Final   Proteus species NOT DETECTED NOT DETECTED Final   Salmonella species NOT  DETECTED NOT DETECTED Final   Serratia marcescens NOT DETECTED NOT DETECTED Final   Haemophilus influenzae NOT DETECTED NOT DETECTED Final   Neisseria meningitidis NOT DETECTED NOT DETECTED Final   Pseudomonas aeruginosa NOT DETECTED NOT DETECTED Final   Stenotrophomonas maltophilia NOT DETECTED NOT DETECTED Final   Candida albicans NOT DETECTED NOT DETECTED Final   Candida auris NOT DETECTED NOT DETECTED Final   Candida glabrata NOT DETECTED NOT DETECTED Final   Candida krusei NOT DETECTED NOT DETECTED Final   Candida parapsilosis NOT DETECTED NOT DETECTED Final   Candida tropicalis NOT DETECTED NOT DETECTED Final   Cryptococcus neoformans/gattii NOT DETECTED NOT DETECTED Final    Comment: Performed at [facility], [address] [place], [place] [address]  Culture, blood (Routine X 2) w Reflex to ID Panel     Status: None (Preliminary result)   Collection Time: 09/17/21  5:05 AM   Specimen: BLOOD  Result Value Ref Range Status   Specimen Description BLOOD BLOOD RIGHT HAND  Final   Special Requests   Final    IN PEDIATRIC BOTTLE Blood Culture results may not be optimal due to an excessive volume of blood received in culture bottles  Culture   Final    NO GROWTH 2 DAYS Performed at [facility], [address] [place], [place] [address]    Report Status PENDING  Incomplete    Radiology Studies: No results found.   LOS: 4 days   Antonieta Pert, MD [facility]  09/19/2021, 12:01 PM

## 2021-09-19 NOTE — Progress Notes (Signed)
*  PRELIMINARY RESULTS* Echocardiogram Echocardiogram Transesophageal has been performed.  Sherrie Sport 09/19/2021, 1:03 PM

## 2021-09-19 NOTE — TOC Progression Note (Signed)
Transition of Care [facility]) - Progression Note    Patient Details  Name: Becky Trevino MRN: [id number] Date of Birth: Sep 04, 1946  Transition of Care [facility]) CM/SW [place], RN Phone Number: 09/19/2021, 8:58 AM  Clinical Narrative:   The patient will need 6 weeks IV Ceftriaxone, I sent out the bed search to local facilities that are pending, requested a bed. Awaiting bed offers         Expected Discharge Plan and Services                                                 Social Determinants of Health (SDOH) Interventions    Readmission Risk Interventions No flowsheet data found.

## 2021-09-19 NOTE — Consult Note (Signed)
ANTICOAGULATION CONSULT NOTE - Initial Consult  Pharmacy Consult for Heparin Indication: atrial fibrillation  Allergies  Allergen Reactions   Lisinopril Swelling    Patient Measurements: Height: 5\' 11"  (180.3 cm) Weight: 136.1 kg (300 lb) IBW/kg (Calculated) : 70.8 Heparin Dosing Weight: 102.8 kg  Vital Signs: Temp: 98.6 F (37 C) (01/05 2044) Temp Source: Oral (01/05 1329) BP: 106/58 (01/05 2044) Pulse Rate: 83 (01/05 2044)  Labs: Recent Labs    09/17/21 0505 09/18/21 0434 09/19/21 0447 09/19/21 0821 09/19/21 2146  HGB 10.5* 9.4* 8.9*  --   --   HCT 31.6* 29.1* 27.8*  --   --   PLT 145* 157 199  --   --   APTT  --   --   --  35 77*  LABPROT  --   --   --  20.8*  --   INR  --   --   --  1.8*  --   CREATININE 1.28* 1.28* 1.06*  --   --      Estimated Creatinine Clearance: 70.1 mL/min (A) (by C-G formula based on SCr of 1.06 mg/dL (H)).   Medical History: Past Medical History:  Diagnosis Date   Cervical spondylosis    Chronic kidney disease    Stage 3   DDD (degenerative disc disease), cervical    [facility]   Diabetes mellitus without complication (HCC)    Diverticulosis    Hyperlipidemia    Hypertension    Intertrigo    Leg cramps    Leg varices    Obesity    OSA (obstructive sleep apnea)    Symptomatic menopausal or female climacteric states    Syncope     Medications:  AC: Apixaban 5mg  BID  Assessment: Pharmacy has been consulted to transition 76yo patient with history of atrial fibrillation from Apixaban to Heparin for possible CT-guided aspiration. Patient's last dose of apixaban was on 1/4@2114 .   09/19/21 2146 aPTT = 77 sec Therapeutic x 1   Goal of Therapy:  Heparin level 0.3-0.7 units/ml aPTT 66-102 seconds Monitor platelets by anticoagulation protocol: Yes   Plan:  Continue heparin infusion at 1400 units/hr Check confirmatory aPTT and heparin level in 8 hours and HL daily until levels correlate, then will transition to HL  dosing Continue to monitor H&H and platelets  Dorothe Pea, PharmD, BCPS Clinical Pharmacist   09/19/2021 10:37 PM

## 2021-09-19 NOTE — Progress Notes (Signed)
*  PRELIMINARY RESULTS* Echocardiogram 2D Echocardiogram has been performed.  Sherrie Sport 09/19/2021, 11:47 AM

## 2021-09-19 NOTE — Consult Note (Addendum)
ANTICOAGULATION CONSULT NOTE - Initial Consult  Pharmacy Consult for Heparin Indication: atrial fibrillation  Allergies  Allergen Reactions   Lisinopril Swelling    Patient Measurements: Height: 5\' 11"  (180.3 cm) Weight: 136.1 kg (300 lb) IBW/kg (Calculated) : 70.8 Heparin Dosing Weight: 102.8 kg  Vital Signs: Temp: 99 F (37.2 C) (01/05 0717) BP: 103/55 (01/05 0717) Pulse Rate: 80 (01/05 0717)  Labs: Recent Labs    09/17/21 0505 09/18/21 0434 09/19/21 0447  HGB 10.5* 9.4* 8.9*  HCT 31.6* 29.1* 27.8*  PLT 145* 157 199  CREATININE 1.28* 1.28* 1.06*    Estimated Creatinine Clearance: 70.1 mL/min (A) (by C-G formula based on SCr of 1.06 mg/dL (H)).   Medical History: Past Medical History:  Diagnosis Date   Cervical spondylosis    Chronic kidney disease    Stage 3   DDD (degenerative disc disease), cervical    [facility]   Diabetes mellitus without complication (HCC)    Diverticulosis    Hyperlipidemia    Hypertension    Intertrigo    Leg cramps    Leg varices    Obesity    OSA (obstructive sleep apnea)    Symptomatic menopausal or female climacteric states    Syncope     Medications:  AC: Apixaban 5mg  BID  Assessment: Pharmacy has been consulted to transition 76yo patient with history of atrial fibrillation from Apixaban to Heparin for possible CT-guided aspiration. Patient's last dose of apixaban was on 1/4@2114 .   Goal of Therapy:  Heparin level 0.3-0.7 units/ml Monitor platelets by anticoagulation protocol: Yes   Plan:  Give 4000 units bolus x 1 Start heparin infusion at 1400 units/hr Check aPTT level in 8 hours and HL daily until levels correlate, then will transition to HL dosing Continue to monitor H&H and platelets  Pearla Dubonnet, PharmD Clinical Pharmacist 09/19/2021 9:28 AM

## 2021-09-19 NOTE — Progress Notes (Signed)
ID Pt underwent TEE today- no vegetation Had rt hip aspiration today- 3 cc of fluid  Pt says she is feleing okay Patient Vitals for the past 24 hrs:  BP Temp Temp src Pulse Resp SpO2  09/19/21 1550 104/60 99.3 F (37.4 C) -- 82 16 100 %  09/19/21 1329 (!) 105/53 99.4 F (37.4 C) Oral 78 18 98 %  09/19/21 1245 (!) 102/58 -- -- 81 16 95 %  09/19/21 1240 113/63 -- -- 84 16 95 %  09/19/21 1230 (!) 113/52 -- -- 84 16 99 %  09/19/21 1157 (!) 113/52 98.4 F (36.9 C) Oral 80 18 94 %  09/19/21 0717 (!) 103/55 99 F (37.2 C) -- 80 17 97 %  09/19/21 0403 123/69 98 F (36.7 C) -- 83 18 93 %  09/19/21 0005 105/60 98.3 F (36.8 C) -- 89 18 92 %  09/18/21 1946 125/63 99.9 F (37.7 C) -- 95 18 97 %    Chest CTA [id number]- systolic murmur Abd soft Edema legs   Labs CBC Latest Ref Rng & Units 09/19/2021 09/18/2021 09/17/2021  WBC 4.0 - 10.5 K/uL 9.2 9.7 8.3  Hemoglobin 12.0 - 15.0 g/dL 8.9(L) 9.4(L) 10.5(L)  Hematocrit 36.0 - 46.0 % 27.8(L) 29.1(L) 31.6(L)  Platelets 150 - 400 K/uL 199 157 145(L)    CMP Latest Ref Rng & Units 09/19/2021 09/18/2021 09/17/2021  Glucose 70 - 99 mg/dL 91 102(H) 102(H)  BUN 8 - 23 mg/dL 20 21 23   Creatinine 0.44 - 1.00 mg/dL 1.06(H) 1.28(H) 1.28(H)  Sodium 135 - 145 mmol/L 134(L) 134(L) 134(L)  Potassium 3.5 - 5.1 mmol/L 3.8 3.7 3.6  Chloride 98 - 111 mmol/L 99 102 103  CO2 22 - 32 mmol/L 25 26 23   Calcium 8.9 - 10.3 mg/dL 8.5(L) 8.6(L) 8.5(L)  Total Protein 6.5 - 8.1 g/dL - - -  Total Bilirubin 0.3 - 1.2 mg/dL - - -  Alkaline Phos 38 - 126 U/L - - -  AST 15 - 41 U/L - - -  ALT 0 - 44 U/L - - -     Micro 09/15/21 group B strep 09/17/21 - BC-   Impression/recommendation pt presenting with fever/sepsis Group B streptococcal bacteremia- third episode in 24 months- I do not see any cellulitis of the left thigh currently- on ceftriaxone Repeat culture neg so far TEE done today no vegetation   Initially it was the rt ankle  ORIF infection feb 2021 Then it was left  thigh cellulitis in Nov 2021 This time could be the rt hip  Aspiration of rt hip done today- sent forculture- asked them to do cell count as well Currently on IV ceftriaxone- will need for 6 weeks minimum followed by suppressive therapy with amoxicillin Discussed with Dr.Menz- Rt knee or RT ankle hardware is not infected and not the source for the bacteremia   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

## 2021-09-19 NOTE — CV Procedure (Signed)
Transesophageal echocardiogram preliminary report  Becky Trevino [id number] [birth year]  Preliminary diagnosis  Bacteremia with possible endocarditis  Postprocedural diagnosis Normal LV function without evidence of endocarditis or vegetation  Time out A timeout was performed by the nursing staff and physicians specifically identifying the procedure performed, identification of the patient, the type of sedation, all allergies and medications, all pertinent medical history, and presedation assessment of nasopharynx. The patient and or family understand the risks of the procedure including the rare risks of death, stroke, heart attack, esophogeal perforation, sore throat, and reaction to medications given.  Moderate sedation During this procedure the patient has received Versed 2 milligrams and fentanyl 50 micrograms to achieve appropriate moderate sedation.  The patient had continued monitoring of heart rate, oxygenation, blood pressure, respiratory rate, and extent of signs of sedation throughout the entire procedure.  The patient received this moderate sedation over a period of 12 minutes.  Both the nursing staff and I were present during the procedure when the patient had moderate sedation for 100% of the time.  Treatment considerations  No additional treatment considerations needed for bacteremia due to no current evidence of endocarditis  For further details of transesophageal echocardiogram please refer to final report.  Signed,  Corey Skains M.D. [facility] 09/19/2021 12:54 PM

## 2021-09-19 NOTE — Plan of Care (Signed)

## 2021-09-20 ENCOUNTER — Encounter: Payer: Self-pay | Admitting: Internal Medicine

## 2021-09-20 ENCOUNTER — Inpatient Hospital Stay: Payer: Self-pay

## 2021-09-20 DIAGNOSIS — B951 Streptococcus, group B, as the cause of diseases classified elsewhere: Secondary | ICD-10-CM | POA: Diagnosis not present

## 2021-09-20 DIAGNOSIS — R7881 Bacteremia: Secondary | ICD-10-CM | POA: Diagnosis not present

## 2021-09-20 LAB — BASIC METABOLIC PANEL
Anion gap: 7 (ref 5–15)
BUN: 16 mg/dL (ref 8–23)
CO2: 26 mmol/L (ref 22–32)
Calcium: 8.2 mg/dL — ABNORMAL LOW (ref 8.9–10.3)
Chloride: 101 mmol/L (ref 98–111)
Creatinine, Ser: 0.99 mg/dL (ref 0.44–1.00)
GFR, Estimated: 59 mL/min — ABNORMAL LOW (ref >60)
Glucose, Bld: 94 mg/dL (ref 70–99)
Potassium: 3.7 mmol/L (ref 3.5–5.1)
Sodium: 134 mmol/L — ABNORMAL LOW (ref 135–145)

## 2021-09-20 LAB — GLUCOSE, CAPILLARY
Glucose-Capillary: 84 mg/dL (ref 70–99)
Glucose-Capillary: 85 mg/dL (ref 70–99)
Glucose-Capillary: 94 mg/dL (ref 70–99)
Glucose-Capillary: 104 mg/dL — ABNORMAL HIGH (ref 70–99)

## 2021-09-20 LAB — CBC
HCT: 25.5 % — ABNORMAL LOW (ref 36.0–46.0)
Hemoglobin: 8.5 g/dL — ABNORMAL LOW (ref 12.0–15.0)
MCH: 31.8 pg (ref 26.0–34.0)
MCHC: 33.3 g/dL (ref 30.0–36.0)
MCV: 95.5 fL (ref 80.0–100.0)
Platelets: 228 10*3/uL (ref 150–400)
RBC: 2.67 MIL/uL — ABNORMAL LOW (ref 3.87–5.11)
RDW: 18.3 % — ABNORMAL HIGH (ref 11.5–15.5)
WBC: 10.8 10*3/uL — ABNORMAL HIGH (ref 4.0–10.5)
nRBC: 0.8 % — ABNORMAL HIGH (ref 0.0–0.2)

## 2021-09-20 LAB — APTT: aPTT: 77 seconds — ABNORMAL HIGH (ref 24–36)

## 2021-09-20 LAB — HEPARIN LEVEL (UNFRACTIONATED): Heparin Unfractionated: >1.1 IU/mL — ABNORMAL HIGH (ref 0.30–0.70)

## 2021-09-20 MED ORDER — CHLORHEXIDINE GLUCONATE CLOTH 2 % EX PADS
6.0000 | MEDICATED_PAD | Freq: Every day | CUTANEOUS | Status: DC
  Administered 2021-09-20 – 2021-09-24 (×5): 6 via TOPICAL

## 2021-09-20 MED ORDER — SODIUM CHLORIDE 0.9% FLUSH
10.0000 mL | INTRAVENOUS | Status: DC | PRN
  Administered 2021-09-23: 10 mL

## 2021-09-20 MED ORDER — SODIUM CHLORIDE 0.9% FLUSH
10.0000 mL | Freq: Two times a day (BID) | INTRAVENOUS | Status: DC
  Administered 2021-09-20 – 2021-09-24 (×8): 10 mL

## 2021-09-20 MED ORDER — APIXABAN 5 MG PO TABS
5.0000 mg | ORAL_TABLET | Freq: Two times a day (BID) | ORAL | Status: DC
  Administered 2021-09-20 – 2021-09-24 (×9): 5 mg via ORAL
  Filled 2021-09-20 (×10): qty 1

## 2021-09-20 NOTE — Treatment Plan (Signed)
Diagnosis: Group B streptococcal bacteremia - recurrent ? Rt hip septic arthritis Many hardware= Baseline Creatinine <1   Allergies  Allergen Reactions   Lisinopril Swelling    OPAT Orders Discharge antibiotics: Ceftriaxone 2 grams Iv every 24 hours  Duration: Total of 6 weeks End Date:10/26/21 From 10/27/21 she will take amoxicillin 530m PO TID   [facility]  Labs weekly on Mon/or Tuesday while on IV antibiotics: _X_ CBC with differential  _X_ CMP  _X_ ESR   _X_ Please pull PIC at completion of IV antibiotics   Fax weekly labs to [phone] Clinic Follow Up Appt: 10/10/21 at 11 am   Call [phone] any questions or critical value

## 2021-09-20 NOTE — Progress Notes (Signed)
Peripherally Inserted Central Catheter Placement  The IV Nurse has discussed with the patient and/or persons authorized to consent for the patient, the purpose of this procedure and the potential benefits and risks involved with this procedure.  The benefits include less needle sticks, lab draws from the catheter, and the patient may be discharged home with the catheter. Risks include, but not limited to, infection, bleeding, blood clot (thrombus formation), and puncture of an artery; nerve damage and irregular heartbeat and possibility to perform a PICC exchange if needed/ordered by physician.  Alternatives to this procedure were also discussed.  Bard Power PICC patient education guide, fact sheet on infection prevention and patient information card has been provided to patient /or left at bedside.    PICC Placement Documentation  PICC Single Lumen 09/20/21 Right Brachial 41 cm 0 cm (Active)  Indication for Insertion or Continuance of Line Home intravenous therapies (PICC only) 09/20/21 1400  Exposed Catheter (cm) 0 cm 09/20/21 1400  Site Assessment Clean;Dry;Intact 09/20/21 1400  Line Status Flushed;Saline locked;Blood return noted 09/20/21 1400  Dressing Type Transparent;Securing device 09/20/21 1400  Dressing Status Clean;Dry;Intact 09/20/21 1400  Antimicrobial disc in place? Yes 09/20/21 1400  Safety Lock Not Applicable 28/63/81 7711  Line Care Connections checked and tightened 09/20/21 1400  Dressing Intervention New dressing 09/20/21 1400  Dressing Change Due 09/27/21 09/20/21 1400       Holley Bouche Renee 09/20/2021, 2:51 PM

## 2021-09-20 NOTE — TOC Progression Note (Signed)
Transition of Care [facility]) - Progression Note    Patient Details  Name: Becky Trevino MRN: [id number] Date of Birth: 21-Sep-1945  [facility] of [facility] CM/SW [place], RN Phone Number: 09/20/2021, 10:39 AM  Clinical Narrative:   has 2 bed offers, she does not want those beds, Sent the bed search to all of the Hub, will review the bed offers once obtained         Expected Discharge Plan and Services                                                 Social Determinants of Health (SDOH) Interventions    Readmission Risk Interventions No flowsheet data found.

## 2021-09-20 NOTE — Care Management Important Message (Signed)
Important Message  Patient Details  Name: Becky Trevino MRN: [id number] Date of Birth: Aug 26, 1946   Medicare Important Message Given:  Yes     Juliann Pulse A Aveya Beal 09/20/2021, 11:46 AM

## 2021-09-20 NOTE — Progress Notes (Addendum)
ID Pt doing better Rt hip pain better No sob Appetitie good No fever  0/e Awake and alert, no distress Patient Vitals for the past 24 hrs:  BP Temp Temp src Pulse Resp SpO2  09/20/21 1136 (!) 92/52 98.5 F (36.9 C) -- 77 19 96 %  09/20/21 0735 (!) 103/57 98.8 F (37.1 C) -- 76 17 95 %  09/20/21 0318 (!) 114/56 98.6 F (37 C) -- 80 15 98 %  09/19/21 2329 (!) 103/52 98 F (36.7 C) -- 79 16 94 %  09/19/21 2044 (!) 106/58 98.6 F (37 C) -- 83 14 93 %  09/19/21 1550 104/60 99.3 F (37.4 C) -- 82 16 100 %  09/19/21 1329 (!) 105/53 99.4 F (37.4 C) Oral 78 18 98 %  09/19/21 1245 (!) 102/58 -- -- 81 16 95 %  09/19/21 1240 113/63 -- -- 84 16 95 %  09/19/21 1230 (!) 113/52 -- -- 84 16 99 %    Chest CTA [id number]- systolic murmur Abd soft Edema legs B/l knee scars Cns non focal  Labs CBC Latest Ref Rng & Units 09/20/2021 09/19/2021 09/18/2021  WBC 4.0 - 10.5 K/uL 10.8(H) 9.2 9.7  Hemoglobin 12.0 - 15.0 g/dL 8.5(L) 8.9(L) 9.4(L)  Hematocrit 36.0 - 46.0 % 25.5(L) 27.8(L) 29.1(L)  Platelets 150 - 400 K/uL 228 199 157    CMP Latest Ref Rng & Units 09/20/2021 09/19/2021 09/18/2021  Glucose 70 - 99 mg/dL 94 91 102(H)  BUN 8 - 23 mg/dL 16 20 21   Creatinine 0.44 - 1.00 mg/dL 0.99 1.06(H) 1.28(H)  Sodium 135 - 145 mmol/L 134(L) 134(L) 134(L)  Potassium 3.5 - 5.1 mmol/L 3.7 3.8 3.7  Chloride 98 - 111 mmol/L 101 99 102  CO2 22 - 32 mmol/L 26 25 26   Calcium 8.9 - 10.3 mg/dL 8.2(L) 8.5(L) 8.6(L)  Total Protein 6.5 - 8.1 g/dL - - -  Total Bilirubin 0.3 - 1.2 mg/dL - - -  Alkaline Phos 38 - 126 U/L - - -  AST 15 - 41 U/L - - -  ALT 0 - 44 U/L - - -     Micro 09/15/21 group B strep 09/17/21 - BC- NG Rt hip fluid culture 09/19/21- NG so far  Impression/recommendation pt presenting with fever/sepsis Group B streptococcal bacteremia- third episode in 24 months- I do not see any cellulitis of the left thigh currently- on ceftriaxone Repeat culture neg so far TEE on 09/19/21 no vegetation   Recurrent  bacteremia Initially it was the rt ankle  ORIF infection feb 2021 Then it was left thigh cellulitis in Nov 2021 This time could be the rt hip  Aspiration of rt hip done on 09/19/21- 4000 wbc ( 96% N) Currently on IV ceftriaxone- will need for 6 weeks  followed by suppressive therapy with amoxicillin Discussed with Dr.Menz- Rt knee or RT ankle hardware is not infected and not the source for the bacteremia He does not plan to do any surgery   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 -She has been on Doxy suppressive therapy for the rt knee PJI due to  Doxy does not treat GBS   Left THA- clinically no evidence of infection    had pelvic/gluteal hematoma after fall Nov 2020- Had cellulitis and MRSA bacteremia and GBS strep bacteremia in Dec 2021 She also had MRSA pneumonia during that hospitalization   DM     Afib on eliquis, amiodarone and metoprolol   Hypothyroidism on synthroid  Discussed the  management with the patient, her daughter and care team   AKI   b/l lymphedema legs  Discussed the management with the patient  OPAT Orders Discharge antibiotics: Ceftriaxone 2 grams Iv every 24 hours  Duration: Total of 6 weeks End Date:10/26/21 From 10/27/21 she will take amoxicillin 551m PO TID   [facility]  Labs weekly on Mon/or Tuesday while on IV antibiotics: _X_ CBC with differential  _X_ CMP  _X_ ESR   _X_ Please pull PIC at completion of IV antibiotics   Fax weekly labs to [phone] Clinic Follow Up Appt: 10/10/21 at 11 am   Call [phone] any questions or critical value

## 2021-09-20 NOTE — Progress Notes (Signed)
PROGRESS NOTE    Becky Trevino  XLK:[id number] DOB: 22-Oct-1945 DOA: 09/14/2021 PCP: [name], MD   Chief Complaint  Patient presents with   Fever  Brief Narrative/Hospital Course: Becky Trevino, 76 y.o. female with PMH of  paroxysmal atrial fibrillation (on Eliquis), hyperlipidemia, hypertension, history of SVT, pulmonary hypertension, chronic kidney disease stage IIIB, non-insulin-dependent diabetes mellitus type 2, obesity, obstructive sleep apnea, chronic DVT of the right lower extremity status post IVC filter placement, lymphedema, hypothyroidism who presents to [facility] ED with fever, weakness of 3 days and found to have group B streptococcus bacteremia which is recurrent, at this time suspecting right hip infection Underwent CT-guided aspiration of right hip and TEE 09/19/21  Subjective:  Patient seen and examined.No acute events overnight.  Still complains of right hip pain about to get her pain medication.Denies nausea vomiting chest pain.  Afebrile overnight doing well on room air Labs are stable  Assessment & Plan:  Recurrent Group B streptococcus bacteremia: Third episode in past 24 months previously had right ankle ORIF infection in February 2021 then left thigh cellulitis in November 2021.  This time suspecting right hip as the source underwent CT-guided aspiration 09/19/21 WBC count up in 4000, culture pending, underwent TEE negative for endocarditis.  Repeat blood culture 1/3 no growth so far.  ID and orthopedics following closely. Cont with ceftriaxone will need 6 weeks minimum followed by suppressive therapy with amoxicillin-we will send message to ID if we can get PICC line in today.  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  Rule out sundowning polypharmacy  AKI on CKD stage IIIb: Creatinine improved to 0.9, on  presentation 1.5.  Monitor Recent Labs  Lab 09/16/21 0312 09/17/21 0505 09/18/21 0434 09/19/21 0447 09/20/21 0639  BUN 23 23 21 20 16   CREATININE 1.39* 1.28* 1.28* 1.06* 0.99    Left THA clinically no evidence of infection History of cellulitis and MRSA bacteremia and GBS history of bacteremia and December 2021, history of pelvic/gluteal hematoma after falling November 2022  Anemia of chronic disease hemoglobin stable 9 to 10 g range.  Continue to monitor Recent Labs  Lab 09/16/21 0312 09/17/21 0505 09/18/21 0434 09/19/21 0447 09/20/21 0639  HGB 10.3* 10.5* 9.4* 8.9* 8.5*  HCT 33.0* 31.6* 29.1* 27.8* 25.5*     A. fib on Eliquis and amiodarone and metoprolol, rate is controlled.  Okay to resume Eliquis per IR.   Essential hypertension: Controlled on metoprolol HLD on Lipitor T2DM with CKD without long-term insulin: HbA1c stable 5.4,DM is controlled.  Keep on SSI. Recent Labs  Lab 09/19/21 1214 09/19/21 1430 09/19/21 1712 09/19/21 2108 09/20/21 0734  GLUCAP 76 89 98 92 84    Hypothyroidism cont Synthroid  Ambulatory dysfunction: Continue PT OT eval will need a skilled nursing facility.  Elevated troponin in the setting of dehydration ACS ruled out likely due to AKI and dehydration not changes of ischemia Constipation continue Dulcolax PR/laxatives Lymphedema bilateral lower extremity nonpitting Class III Obesity:Patient's Body mass index is 41.84 kg/m. : Will benefit with PCP follow-up, weight loss  healthy lifestyle and outpatient sleep evaluation.  DVT prophylaxis: eliquis Code Status:   Code Status: Full Code Family Communication: plan of care discussed with patient at bedside. Status is: Inpatient Remains inpatient appropriate because: For ongoing work-up of bacteremia Disposition: Currently not medically stable for discharge. Anticipated Disposition: SNF  once okay with ID  Objective: Vitals last 24 hrs: Vitals:   09/19/21 2044 09/19/21 2329 09/20/21  0318 09/20/21 0735  BP: (!) 106/58 (!) 103/52 (!) 114/56 (!) 103/57  Pulse: 83 79 80 76  Resp: 14 16 15 17   Temp: 98.6 F (37 C) 98 F (36.7 C) 98.6 F (37 C) 98.8 F (37.1 C)  TempSrc:      SpO2: 93% 94% 98% 95%  Weight:      Height:       Weight change:   Intake/Output Summary (Last 24 hours) at 09/20/2021 1016 Last data filed at 09/19/2021 1800 Gross per 24 hour  Intake 360 ml  Output --  Net 360 ml   Net IO Since Admission: -218.8 mL [09/20/21 1016]   Physical Examination: General exam: AAOx 3, obese older than stated age, weak appearing. HEENT:Oral mucosa moist, Ear/Nose WNL grossly, dentition normal. Respiratory system: bilaterally clear, no use of accessory muscle Cardiovascular system: S1 & S2 +, No JVD,. Gastrointestinal system: Abdomen soft,NT,ND, BS+ Nervous System:Alert, awake, moving extremities and grossly nonfocal Extremities: chronic lymphdema, tender rt hip Skin: No rashes,no icterus. MSK: Normal muscle bulk,tone, power   Medications reviewed:  Scheduled Meds:  amiodarone  200 mg Oral BID   apixaban  5 mg Oral BID   aspirin EC  81 mg Oral Daily   atorvastatin  40 mg Oral QPM   brinzolamide  1 drop Both Eyes TID   And   brimonidine  1 drop Both Eyes TID   escitalopram  5 mg Oral Daily   fluticasone  2 spray Each Nare Daily   insulin aspart  0-15 Units Subcutaneous TID AC & HS   latanoprost  1 drop Both Eyes QHS   levothyroxine  25 mcg Oral Q0600   metoprolol succinate  25 mg Oral BID   mirabegron ER  50 mg Oral Daily   Continuous Infusions:  sodium chloride Stopped (09/18/21 0603)   cefTRIAXone (ROCEPHIN)  IV 2 g (09/20/21 1761)    Diet Order             Diet Heart Room service appropriate? Yes; Fluid consistency: Thin  Diet effective now                   Weight change:   Wt Readings from Last 3 Encounters:  09/14/21 136.1 kg  07/23/21 128.4 kg  07/19/21 128.5 kg     Consultants:see note  Procedures:see  note Antimicrobials: Anti-infectives (From admission, onward)    Start     Dose/Rate Route Frequency Ordered Stop   09/15/21 0300  cefTRIAXone (ROCEPHIN) 2 g in sodium chloride 0.9 % 100 mL IVPB        2 g 200 mL/hr over 30 Minutes Intravenous Daily 09/15/21 0152 09/22/21 0559      Culture/Microbiology    Component Value Date/Time   SDES  09/19/2021 1017    ABSCESS Performed at [facility], [address] [place], [place] [address]    [facility]  09/19/2021 1017    NONE Performed at [facility], [address] [place], [place] [address]    CULT PENDING 09/19/2021 1017   REPTSTATUS PENDING 09/19/2021 1017    Other culture-see note  Unresulted Labs (From admission, onward)    None      Data Reviewed: I have personally reviewed following labs and imaging studies CBC: Recent Labs  Lab 09/14/21 1657 09/15/21 0533 09/16/21 9485 09/17/21 0505 09/18/21 4627 09/19/21 0447 09/20/21 0350  WBC 8.5 8.6 9.0 8.3 9.7 9.2 10.8*  NEUTROABS 7.6 7.3  --   --   --   --   --   HGB 12.0 11.3* 10.3* 10.5* 9.4* 8.9* 8.5*  HCT 37.3 34.6* 33.0* 31.6* 29.1* 27.8* 25.5*  MCV 98.2 96.1 98.8 96.0 95.7 94.9 95.5  PLT 137* 126* 121* 145* 157 199 680   Basic Metabolic Panel: Recent Labs  Lab 09/15/21 0533 09/16/21 0312 09/17/21 0505 09/18/21 0434 09/19/21 0447 09/20/21 0639  NA 132* 135 134* 134* 134* 134*  K 3.7 4.1 3.6 3.7 3.8 3.7  CL 101 103 103 102 99 101  CO2 22 24 23 26 25 26   GLUCOSE 100* 84 102* 102* 91 94  BUN 22 23 23 21 20 16   CREATININE 1.31* 1.39* 1.28* 1.28* 1.06* 0.99  CALCIUM 8.8* 8.7* 8.5* 8.6* 8.5* 8.2*  MG 2.1  --   --   --   --   --    GFR: Estimated Creatinine Clearance: 75.1 mL/min (by C-G formula based on SCr of 0.99 mg/dL). Liver Function Tests: Recent Labs  Lab 09/14/21 1657 09/15/21 0533  AST 29 21  ALT 20 18  ALKPHOS 66 60  BILITOT 1.1 1.1  PROT 8.2* 7.5  ALBUMIN 3.7 3.2*   No results for input(s): LIPASE, AMYLASE in  the last 168 hours. No results for input(s): AMMONIA in the last 168 hours. Coagulation Profile: Recent Labs  Lab 09/19/21 0821  INR 1.8*   Cardiac Enzymes: No results for input(s): CKTOTAL, CKMB, CKMBINDEX, TROPONINI in the last 168 hours. BNP (last 3 results) No results for input(s): PROBNP in the last 8760 hours. HbA1C: No results for input(s): HGBA1C in the last 72 hours.  CBG: Recent Labs  Lab 09/19/21 1214 09/19/21 1430 09/19/21 1712 09/19/21 2108 09/20/21 0734  GLUCAP 76 89 98 92 84   Lipid Profile: No results for input(s): CHOL, HDL, LDLCALC, TRIG, CHOLHDL, LDLDIRECT in the last 72 hours. Thyroid Function Tests: No results for input(s): TSH, T4TOTAL, FREET4, T3FREE, THYROIDAB in the last 72 hours. Anemia Panel: No results for input(s): VITAMINB12, FOLATE, FERRITIN, TIBC, IRON, RETICCTPCT in the last 72 hours. Sepsis Labs: Recent Labs  Lab 09/14/21 1657 09/14/21 1943 09/15/21 2211 09/16/21 0137  PROCALCITON 5.38  --   --   --   LATICACIDVEN 1.6 1.6 1.3 1.4    Recent Results (from the past 240 hour(s))  Resp Panel by RT-PCR (Flu A&B, Covid) Nasopharyngeal Swab     Status: None   Collection Time: 09/14/21  4:57 PM   Specimen: Nasopharyngeal Swab; Nasopharyngeal(NP) swabs in vial transport medium  Result Value Ref Range Status   SARS Coronavirus 2 by RT PCR NEGATIVE NEGATIVE Final    Comment: (NOTE) SARS-CoV-2 target nucleic acids are NOT DETECTED.  The SARS-CoV-2 RNA is generally detectable in upper respiratory specimens during the acute phase of infection. The lowest concentration of SARS-CoV-2 viral copies this assay can detect is 138 copies/mL. A negative result does not preclude SARS-Cov-2 infection and should not be used as the sole basis for treatment or other patient management decisions. A negative result may occur with  improper specimen collection/handling, submission of specimen other than nasopharyngeal swab, presence of viral mutation(s)  within the areas targeted by this assay, and inadequate number of viral copies(<138 copies/mL). A negative result must be combined with clinical observations, patient history, and epidemiological information. The expected result is Negative.  Fact Sheet for Patients:  EntrepreneurPulse.com.au  Fact Sheet for Healthcare Providers:  IncredibleEmployment.be  This test is no t yet approved or cleared by the [place] and  has been authorized for detection and/or diagnosis of SARS-CoV-2 by FDA under an Emergency Use Authorization (EUA). This EUA will remain  in effect (meaning this test can be used) for the duration of the COVID-19 declaration under Section 564(b)(1) of the Act, 21 U.S.C.section 360bbb-3(b)(1), unless the authorization is terminated  or revoked sooner.       Influenza A by PCR NEGATIVE NEGATIVE Final   Influenza B by PCR NEGATIVE NEGATIVE Final    Comment: (NOTE) The Xpert Xpress SARS-CoV-2/FLU/RSV plus assay is intended as an aid in the diagnosis of influenza from Nasopharyngeal swab specimens and should not be used as a sole basis for treatment. Nasal washings and aspirates are unacceptable for Xpert Xpress SARS-CoV-2/FLU/RSV testing.  Fact Sheet for Patients: EntrepreneurPulse.com.au  Fact Sheet for Healthcare Providers: IncredibleEmployment.be  This test is not yet approved or cleared by the [place] FDA and has been authorized for detection and/or diagnosis of SARS-CoV-2 by FDA under an Emergency Use Authorization (EUA). This EUA will remain in effect (meaning this test can be used) for the duration of the COVID-19 declaration under Section 564(b)(1) of the Act, 21 U.S.C. section 360bbb-3(b)(1), unless the authorization is terminated or revoked.  Performed at [facility], [address] [place], [place] [address]   Culture, blood (Routine X 2) w Reflex to ID  Panel     Status: Abnormal   Collection Time: 09/14/21 11:15 PM   Specimen: BLOOD  Result Value Ref Range Status   Specimen Description   Final    BLOOD RIGHT ANTECUBITAL Performed at [facility], [address] [place], [place] [address]    Special Requests   Final    BOTTLES DRAWN AEROBIC AND ANAEROBIC Blood Culture results may not be optimal due to an excessive volume of blood received in culture bottles Performed at [facility], [address], [place], [place] [address]    Culture  Setup Time   Final    GRAM POSITIVE COCCI IN BOTH AEROBIC AND ANAEROBIC BOTTLES CRITICAL VALUE NOTED.  VALUE IS CONSISTENT WITH PREVIOUSLY REPORTED AND CALLED VALUE. Performed at [facility], [address] [place], [place] [address]    Culture (A)  Final    STREPTOCOCCUS AGALACTIAE SUSCEPTIBILITIES PERFORMED ON PREVIOUS CULTURE WITHIN THE LAST 5 DAYS. STAPHYLOCOCCUS CAPITIS THE SIGNIFICANCE OF ISOLATING THIS ORGANISM FROM A SINGLE SET OF BLOOD CULTURES WHEN MULTIPLE SETS ARE DRAWN IS UNCERTAIN. PLEASE NOTIFY THE MICROBIOLOGY DEPARTMENT WITHIN ONE WEEK IF SPECIATION AND SENSITIVITIES ARE REQUIRED. Performed at [facility], [address], [place], [place] [address]    Report Status 09/18/2021 FINAL  Final  Resp Panel by RT-PCR (Flu A&B, Covid) Nasopharyngeal Swab     Status: None   Collection Time: 09/14/21 11:15 PM   Specimen: Nasopharyngeal Swab; Nasopharyngeal(NP) swabs in vial transport medium  Result Value Ref Range Status   SARS Coronavirus 2 by RT PCR NEGATIVE NEGATIVE Final    Comment: (NOTE) SARS-CoV-2 target nucleic acids are NOT DETECTED.  The SARS-CoV-2 RNA is generally detectable in upper respiratory specimens during the acute phase of infection. The lowest concentration of SARS-CoV-2 viral copies this assay can detect is 138 copies/mL. A negative result does not preclude SARS-Cov-2 infection and should not be used as the sole basis for  treatment or other patient management decisions. A negative result may occur with  improper specimen collection/handling, submission  of specimen other than nasopharyngeal swab, presence of viral mutation(s) within the areas targeted by this assay, and inadequate number of viral copies(<138 copies/mL). A negative result must be combined with clinical observations, patient history, and epidemiological information. The expected result is Negative.  Fact Sheet for Patients:  EntrepreneurPulse.com.au  Fact Sheet for Healthcare Providers:  IncredibleEmployment.be  This test is no t yet approved or cleared by the [place] FDA and  has been authorized for detection and/or diagnosis of SARS-CoV-2 by FDA under an Emergency Use Authorization (EUA). This EUA will remain  in effect (meaning this test can be used) for the duration of the COVID-19 declaration under Section 564(b)(1) of the Act, 21 U.S.C.section 360bbb-3(b)(1), unless the authorization is terminated  or revoked sooner.       Influenza A by PCR NEGATIVE NEGATIVE Final   Influenza B by PCR NEGATIVE NEGATIVE Final    Comment: (NOTE) The Xpert Xpress SARS-CoV-2/FLU/RSV plus assay is intended as an aid in the diagnosis of influenza from Nasopharyngeal swab specimens and should not be used as a sole basis for treatment. Nasal washings and aspirates are unacceptable for Xpert Xpress SARS-CoV-2/FLU/RSV testing.  Fact Sheet for Patients: EntrepreneurPulse.com.au  Fact Sheet for Healthcare Providers: IncredibleEmployment.be  This test is not yet approved or cleared by the [place] FDA and has been authorized for detection and/or diagnosis of SARS-CoV-2 by FDA under an Emergency Use Authorization (EUA). This EUA will remain in effect (meaning this test can be used) for the duration of the COVID-19 declaration under Section 564(b)(1) of the Act, 21  U.S.C. section 360bbb-3(b)(1), unless the authorization is terminated or revoked.  Performed at [facility], [address] [place], [place] [address]   Culture, blood (Routine X 2) w Reflex to ID Panel     Status: Abnormal   Collection Time: 09/15/21  2:26 AM   Specimen: BLOOD  Result Value Ref Range Status   Specimen Description   Final    BLOOD BLOOD RIGHT HAND Performed at [facility], [address], [place], [place] [address]    Special Requests   Final    BOTTLES DRAWN AEROBIC AND ANAEROBIC Blood Culture adequate volume Performed at [facility], [address] [place], [place] [address]    Culture  Setup Time   Final    Organism ID to follow GRAM POSITIVE COCCI IN BOTH AEROBIC AND ANAEROBIC BOTTLES CRITICAL RESULT CALLED TO, READ BACK BY AND VERIFIED WITHLloyd Huger [id number] 09/16/21 HNM Performed at [facility], [address] [place], [place] [address]    Culture GROUP B STREP(S.AGALACTIAE)ISOLATED (A)  Final   Report Status 09/18/2021 FINAL  Final   Organism ID, Bacteria GROUP B STREP(S.AGALACTIAE)ISOLATED  Final      Susceptibility   Group b strep(s.agalactiae)isolated - MIC*    CLINDAMYCIN >=1 RESISTANT Resistant     AMPICILLIN <=0.25 SENSITIVE Sensitive     ERYTHROMYCIN >=8 RESISTANT Resistant     VANCOMYCIN 0.5 SENSITIVE Sensitive     CEFTRIAXONE <=0.12 SENSITIVE Sensitive     LEVOFLOXACIN 1 SENSITIVE Sensitive     * GROUP B STREP(S.AGALACTIAE)ISOLATED  Blood Culture ID Panel (Reflexed)     Status: Abnormal   Collection Time: 09/15/21  2:26 AM  Result Value Ref Range Status   Enterococcus faecalis NOT DETECTED NOT DETECTED Final   Enterococcus Faecium NOT DETECTED NOT DETECTED Final   Listeria monocytogenes NOT DETECTED NOT DETECTED Final   Staphylococcus species NOT DETECTED NOT DETECTED Final  Staphylococcus aureus (BCID) NOT DETECTED NOT DETECTED Final   Staphylococcus epidermidis NOT DETECTED NOT  DETECTED Final   Staphylococcus lugdunensis NOT DETECTED NOT DETECTED Final   Streptococcus species DETECTED (A) NOT DETECTED Final    Comment: CRITICAL RESULT CALLED TO, READ BACK BY AND VERIFIED WITH: NATHAN BELUE PHARMD [id number] 09/16/21 HNM    Streptococcus agalactiae DETECTED (A) NOT DETECTED Final    Comment: CRITICAL RESULT CALLED TO, READ BACK BY AND VERIFIED WITH: Lloyd Huger PHARMD [id number] 09/16/21 HNM    Streptococcus pneumoniae NOT DETECTED NOT DETECTED Final   Streptococcus pyogenes NOT DETECTED NOT DETECTED Final   A.calcoaceticus-baumannii NOT DETECTED NOT DETECTED Final   Bacteroides fragilis NOT DETECTED NOT DETECTED Final   Enterobacterales NOT DETECTED NOT DETECTED Final   Enterobacter cloacae complex NOT DETECTED NOT DETECTED Final   Escherichia coli NOT DETECTED NOT DETECTED Final   Klebsiella aerogenes NOT DETECTED NOT DETECTED Final   Klebsiella oxytoca NOT DETECTED NOT DETECTED Final   Klebsiella pneumoniae NOT DETECTED NOT DETECTED Final   Proteus species NOT DETECTED NOT DETECTED Final   Salmonella species NOT DETECTED NOT DETECTED Final   Serratia marcescens NOT DETECTED NOT DETECTED Final   Haemophilus influenzae NOT DETECTED NOT DETECTED Final   Neisseria meningitidis NOT DETECTED NOT DETECTED Final   Pseudomonas aeruginosa NOT DETECTED NOT DETECTED Final   Stenotrophomonas maltophilia NOT DETECTED NOT DETECTED Final   Candida albicans NOT DETECTED NOT DETECTED Final   Candida auris NOT DETECTED NOT DETECTED Final   Candida glabrata NOT DETECTED NOT DETECTED Final   Candida krusei NOT DETECTED NOT DETECTED Final   Candida parapsilosis NOT DETECTED NOT DETECTED Final   Candida tropicalis NOT DETECTED NOT DETECTED Final   Cryptococcus neoformans/gattii NOT DETECTED NOT DETECTED Final    Comment: Performed at [facility], [address] [place], [place] [address]  Culture, blood (Routine X 2) w Reflex to ID Panel     Status: None (Preliminary result)    Collection Time: 09/17/21  5:05 AM   Specimen: BLOOD  Result Value Ref Range Status   Specimen Description BLOOD BLOOD RIGHT HAND  Final   Special Requests   Final    IN PEDIATRIC BOTTLE Blood Culture results may not be optimal due to an excessive volume of blood received in culture bottles   Culture   Final    NO GROWTH 3 DAYS Performed at [facility], [address] [place], [place] [address]    Report Status PENDING  Incomplete  Aerobic/Anaerobic Culture w Gram Stain (surgical/deep wound)     Status: None (Preliminary result)   Collection Time: 09/19/21 10:17 AM   Specimen: Abscess; Synovial Fluid  Result Value Ref Range Status   Specimen Description   Final    ABSCESS Performed at [facility], [address], [place], [place] [address]    Special Requests   Final    NONE Performed at [facility], [address] [place], [place] [address]    Gram Stain   Final    FEW WBC PRESENT,BOTH PMN AND MONONUCLEAR NO ORGANISMS SEEN Performed at [facility], [address], [place], [place] [address]    Culture PENDING  Incomplete   Report Status PENDING  Incomplete    Radiology Studies: IR Fluoro Guide Ndl Plmt / BX  Result Date: 09/19/2021 INDICATION: Concern for septic right hip joint EXAM: Right hip aspiration using fluoroscopic guidance MEDICATIONS: None. ANESTHESIA/SEDATION: Local analgesia FLUOROSCOPY TIME:  Fluoroscopy Time: 0.8  minutes (8.7 mGy) COMPLICATIONS: None immediate. PROCEDURE: Informed written consent was obtained from the patient after a thorough discussion of the procedural risks, benefits and alternatives. All questions were addressed. Maximal Sterile Barrier Technique was utilized including caps, mask, sterile gowns, sterile gloves, sterile drape, hand hygiene and skin antiseptic. A timeout was performed prior to the initiation of the procedure. The patient was placed supine on the exam table. The right hip was examined under  fluoroscopy, an appropriate skin entry site was marked. The overlying skin was prepped and draped in the standard sterile fashion. Local analgesia was obtained with 1% lidocaine. Using fluoroscopic guidance, a 20 gauge spinal needle was advanced into the right hip joint space. The stylet was removed, an approximately 3 mL of thin serosanguineous fluid was aspirated. The sample was sent to the lab for further analysis. Needle was removed, and hemostasis was achieved using manual pressure. A clean dressing was placed. The patient tolerated the procedure well without immediate complication. IMPRESSION: Successful right hip aspiration using fluoroscopic guidance with removal of approximally 3 mL of thin, serosanguineous fluid. A sample was sent to the lab for analysis. Electronically Signed   By: Albin Felling M.D.   On: 09/19/2021 12:59   ECHOCARDIOGRAM COMPLETE  Result Date: 09/19/2021    ECHOCARDIOGRAM REPORT   Patient Name:   BEDELIA PONG Date of Exam: 09/19/2021 Medical Rec #:  [id number]       Height:       71.0 in Accession #:    [id number]      Weight:       300.0 lb Date of Birth:  [birth year]       BSA:          2.505 m Patient Age:    38 years        BP:           103/55 mmHg Patient Gender: F               HR:           80 bpm. Exam Location:  [facility] Procedure: 2D Echo, Cardiac Doppler and Color Doppler Indications:     Bacteremia R78.81  History:         Patient has prior history of Echocardiogram examinations, most                  recent 09/07/2020. Signs/Symptoms:Syncope; Risk                  Factors:Diabetes and Hypertension. CKD.  Sonographer:     Sherrie Sport Referring Phys:  [id number] [name] Diagnosing Phys: Isaias Cowman MD  Sonographer Comments: Suboptimal apical window. IMPRESSIONS  1. Left ventricular ejection fraction, by estimation, is 65 to 70%. The left ventricle has normal function. The left ventricle has no regional wall motion abnormalities. Left ventricular diastolic  parameters are indeterminate.  2. Right ventricular systolic function is normal. The right ventricular size is normal.  3. The mitral valve is normal in structure. Mild mitral valve regurgitation. No evidence of mitral stenosis.  4. Tricuspid valve regurgitation is mild to moderate.  5. The aortic valve is normal in structure. Aortic valve regurgitation is not visualized. No aortic stenosis is present.  6. The inferior vena cava is normal in size with greater than 50% respiratory variability, suggesting right atrial pressure of 3 mmHg. FINDINGS  Left Ventricle: Left ventricular ejection fraction, by estimation, is 65 to 70%. The left ventricle has normal function. The left  ventricle has no regional wall motion abnormalities. The left ventricular internal cavity size was normal in size. There is  no left ventricular hypertrophy. Left ventricular diastolic parameters are indeterminate. Right Ventricle: The right ventricular size is normal. No increase in right ventricular wall thickness. Right ventricular systolic function is normal. Left Atrium: Left atrial size was normal in size. Right Atrium: Right atrial size was normal in size. Pericardium: There is no evidence of pericardial effusion. Mitral Valve: The mitral valve is normal in structure. Mild mitral valve regurgitation. No evidence of mitral valve stenosis. MV peak gradient, 4.2 mmHg. The mean mitral valve gradient is 2.0 mmHg. Tricuspid Valve: The tricuspid valve is normal in structure. Tricuspid valve regurgitation is mild to moderate. No evidence of tricuspid stenosis. Aortic Valve: The aortic valve is normal in structure. Aortic valve regurgitation is not visualized. No aortic stenosis is present. Aortic valve mean gradient measures 5.5 mmHg. Aortic valve peak gradient measures 9.5 mmHg. Aortic valve area, by VTI measures 3.04 cm. Pulmonic Valve: The pulmonic valve was normal in structure. Pulmonic valve regurgitation is not visualized. No evidence of  pulmonic stenosis. Aorta: The aortic root is normal in size and structure. Venous: The inferior vena cava is normal in size with greater than 50% respiratory variability, suggesting right atrial pressure of 3 mmHg. IAS/Shunts: No atrial level shunt detected by color flow Doppler.  LEFT VENTRICLE PLAX 2D LVIDd:         4.00 cm   Diastology LVIDs:         2.40 cm   LV e' medial:    5.66 cm/s LV PW:         1.00 cm   LV E/e' medial:  13.0 LV IVS:        1.20 cm   LV e' lateral:   7.18 cm/s LVOT diam:     2.00 cm   LV E/e' lateral: 10.3 LV SV:         81 LV SV Index:   32 LVOT Area:     3.14 cm  RIGHT VENTRICLE RV S prime:     13.50 cm/s TAPSE (M-mode): 2.6 cm LEFT ATRIUM             Index        RIGHT ATRIUM           Index LA diam:        3.70 cm 1.48 cm/m   RA Area:     12.20 cm LA Vol (A2C):   61.0 ml 24.35 ml/m  RA Volume:   24.90 ml  9.94 ml/m LA Vol (A4C):   38.0 ml 15.17 ml/m LA Biplane Vol: 50.6 ml 20.20 ml/m  AORTIC VALVE                     PULMONIC VALVE AV Area (Vmax):    3.27 cm      PV Vmax:        1.12 m/s AV Area (Vmean):   3.08 cm      PV Vmean:       81.800 cm/s AV Area (VTI):     3.04 cm      PV VTI:         0.188 m AV Vmax:           154.50 cm/s   PV Peak grad:   5.0 mmHg AV Vmean:          109.000 cm/s  PV Mean grad:  3.0 mmHg AV VTI:            0.266 m       RVOT Peak grad: 7 mmHg AV Peak Grad:      9.5 mmHg AV Mean Grad:      5.5 mmHg LVOT Vmax:         161.00 cm/s LVOT Vmean:        107.000 cm/s LVOT VTI:          0.257 m LVOT/AV VTI ratio: 0.97  AORTA Ao Root diam: 3.40 cm MITRAL VALVE               TRICUSPID VALVE MV Area (PHT): 3.36 cm    TR Peak grad:   27.7 mmHg MV Area VTI:   3.48 cm    TR Vmax:        263.00 cm/s MV Peak grad:  4.2 mmHg MV Mean grad:  2.0 mmHg    SHUNTS MV Vmax:       1.03 m/s    Systemic VTI:  0.26 m MV Vmean:      59.5 cm/s   Systemic Diam: 2.00 cm MV Decel Time: 226 msec    Pulmonic VTI:  0.149 m MV E velocity: 73.70 cm/s MV A velocity: 69.00 cm/s MV E/A  ratio:  1.07 Isaias Cowman MD Electronically signed by Isaias Cowman MD Signature Date/Time: 09/19/2021/1:40:49 PM    Final      LOS: 5 days   Antonieta Pert, MD [facility]  09/20/2021, 10:16 AM

## 2021-09-21 LAB — GLUCOSE, CAPILLARY
Glucose-Capillary: 89 mg/dL (ref 70–99)
Glucose-Capillary: 91 mg/dL (ref 70–99)
Glucose-Capillary: 111 mg/dL — ABNORMAL HIGH (ref 70–99)
Glucose-Capillary: 106 mg/dL — ABNORMAL HIGH (ref 70–99)

## 2021-09-21 MED ORDER — MENTHOL 3 MG MT LOZG
1.0000 | LOZENGE | OROMUCOSAL | Status: DC | PRN
  Administered 2021-09-21: 3 mg via ORAL
  Filled 2021-09-21 (×2): qty 9

## 2021-09-21 NOTE — Progress Notes (Signed)
[facility] Cardiology  SUBJECTIVE: Patient laying in bed, reports doing well, denies chest pain, shortness of breath, palpitations, or heart racing   Vitals:   09/21/21 0327 09/21/21 0827 09/21/21 0828 09/21/21 1118  BP: (!) 101/52 (!) 97/58 (!) 97/58 107/60  Pulse: 81 78 78 79  Resp: 16  16 17   Temp: 98.4 F (36.9 C)  98.6 F (37 C) 98.5 F (36.9 C)  TempSrc:      SpO2: 94%  96% 97%  Weight:      Height:         Intake/Output Summary (Last 24 hours) at 09/21/2021 1129 Last data filed at 09/20/2021 1900 Gross per 24 hour  Intake 240 ml  Output --  Net 240 ml      PHYSICAL EXAM  General: Well developed, well nourished, in no acute distress HEENT:  Normocephalic and atramatic Neck:  No JVD.  Lungs: Clear bilaterally to auscultation and percussion. Heart: HRRR . Normal S1 and S2 without gallops or murmurs.  Abdomen: Bowel sounds are positive, abdomen soft and non-tender  Msk:  Back normal, normal gait. Normal strength and tone for age. Extremities: No clubbing, cyanosis or edema.   Neuro: Alert and oriented X 3. Psych:  Good affect, responds appropriately   LABS: Basic Metabolic Panel: Recent Labs    09/19/21 0447 09/20/21 0639  NA 134* 134*  K 3.8 3.7  CL 99 101  CO2 25 26  GLUCOSE 91 94  BUN 20 16  CREATININE 1.06* 0.99  CALCIUM 8.5* 8.2*   Liver Function Tests: No results for input(s): AST, ALT, ALKPHOS, BILITOT, PROT, ALBUMIN in the last 72 hours. No results for input(s): LIPASE, AMYLASE in the last 72 hours. CBC: Recent Labs    09/19/21 0447 09/20/21 0639  WBC 9.2 10.8*  HGB 8.9* 8.5*  HCT 27.8* 25.5*  MCV 94.9 95.5  PLT 199 228   Cardiac Enzymes: No results for input(s): CKTOTAL, CKMB, CKMBINDEX, TROPONINI in the last 72 hours. BNP: Invalid input(s): POCBNP D-Dimer: No results for input(s): DDIMER in the last 72 hours. Hemoglobin A1C: No results for input(s): HGBA1C in the last 72 hours. Fasting Lipid Panel: No results for input(s): CHOL, HDL,  LDLCALC, TRIG, CHOLHDL, LDLDIRECT in the last 72 hours. Thyroid Function Tests: No results for input(s): TSH, T4TOTAL, T3FREE, THYROIDAB in the last 72 hours.  Invalid input(s): FREET3 Anemia Panel: No results for input(s): VITAMINB12, FOLATE, FERRITIN, TIBC, IRON, RETICCTPCT in the last 72 hours.  ECHOCARDIOGRAM COMPLETE  Result Date: 09/19/2021    ECHOCARDIOGRAM REPORT   Patient Name:   Becky Trevino Date of Exam: 09/19/2021 Medical Rec #:  [id number]       Height:       71.0 in Accession #:    [id number]      Weight:       300.0 lb Date of Birth:  1946/04/07       BSA:          2.505 m Patient Age:    76 years        BP:           103/55 mmHg Patient Gender: F               HR:           80 bpm. Exam Location:  [facility] Procedure: 2D Echo, Cardiac Doppler and Color Doppler Indications:     Bacteremia R78.81  History:         Patient has prior  history of Echocardiogram examinations, most                  recent 09/07/2020. Signs/Symptoms:Syncope; Risk                  Factors:Diabetes and Hypertension. CKD.  Sonographer:     Sherrie Sport Referring Phys:  [id number] [name] Diagnosing Phys: Isaias Cowman MD  Sonographer Comments: Suboptimal apical window. IMPRESSIONS  1. Left ventricular ejection fraction, by estimation, is 65 to 70%. The left ventricle has normal function. The left ventricle has no regional wall motion abnormalities. Left ventricular diastolic parameters are indeterminate.  2. Right ventricular systolic function is normal. The right ventricular size is normal.  3. The mitral valve is normal in structure. Mild mitral valve regurgitation. No evidence of mitral stenosis.  4. Tricuspid valve regurgitation is mild to moderate.  5. The aortic valve is normal in structure. Aortic valve regurgitation is not visualized. No aortic stenosis is present.  6. The inferior vena cava is normal in size with greater than 50% respiratory variability, suggesting right atrial pressure of 3 mmHg.  FINDINGS  Left Ventricle: Left ventricular ejection fraction, by estimation, is 65 to 70%. The left ventricle has normal function. The left ventricle has no regional wall motion abnormalities. The left ventricular internal cavity size was normal in size. There is  no left ventricular hypertrophy. Left ventricular diastolic parameters are indeterminate. Right Ventricle: The right ventricular size is normal. No increase in right ventricular wall thickness. Right ventricular systolic function is normal. Left Atrium: Left atrial size was normal in size. Right Atrium: Right atrial size was normal in size. Pericardium: There is no evidence of pericardial effusion. Mitral Valve: The mitral valve is normal in structure. Mild mitral valve regurgitation. No evidence of mitral valve stenosis. MV peak gradient, 4.2 mmHg. The mean mitral valve gradient is 2.0 mmHg. Tricuspid Valve: The tricuspid valve is normal in structure. Tricuspid valve regurgitation is mild to moderate. No evidence of tricuspid stenosis. Aortic Valve: The aortic valve is normal in structure. Aortic valve regurgitation is not visualized. No aortic stenosis is present. Aortic valve mean gradient measures 5.5 mmHg. Aortic valve peak gradient measures 9.5 mmHg. Aortic valve area, by VTI measures 3.04 cm. Pulmonic Valve: The pulmonic valve was normal in structure. Pulmonic valve regurgitation is not visualized. No evidence of pulmonic stenosis. Aorta: The aortic root is normal in size and structure. Venous: The inferior vena cava is normal in size with greater than 50% respiratory variability, suggesting right atrial pressure of 3 mmHg. IAS/Shunts: No atrial level shunt detected by color flow Doppler.  LEFT VENTRICLE PLAX 2D LVIDd:         4.00 cm   Diastology LVIDs:         2.40 cm   LV e' medial:    5.66 cm/s LV PW:         1.00 cm   LV E/e' medial:  13.0 LV IVS:        1.20 cm   LV e' lateral:   7.18 cm/s LVOT diam:     2.00 cm   LV E/e' lateral: 10.3 LV SV:          81 LV SV Index:   32 LVOT Area:     3.14 cm  RIGHT VENTRICLE RV S prime:     13.50 cm/s TAPSE (M-mode): 2.6 cm LEFT ATRIUM             Index  RIGHT ATRIUM           Index LA diam:        3.70 cm 1.48 cm/m   RA Area:     12.20 cm LA Vol (A2C):   61.0 ml 24.35 ml/m  RA Volume:   24.90 ml  9.94 ml/m LA Vol (A4C):   38.0 ml 15.17 ml/m LA Biplane Vol: 50.6 ml 20.20 ml/m  AORTIC VALVE                     PULMONIC VALVE AV Area (Vmax):    3.27 cm      PV Vmax:        1.12 m/s AV Area (Vmean):   3.08 cm      PV Vmean:       81.800 cm/s AV Area (VTI):     3.04 cm      PV VTI:         0.188 m AV Vmax:           154.50 cm/s   PV Peak grad:   5.0 mmHg AV Vmean:          109.000 cm/s  PV Mean grad:   3.0 mmHg AV VTI:            0.266 m       RVOT Peak grad: 7 mmHg AV Peak Grad:      9.5 mmHg AV Mean Grad:      5.5 mmHg LVOT Vmax:         161.00 cm/s LVOT Vmean:        107.000 cm/s LVOT VTI:          0.257 m LVOT/AV VTI ratio: 0.97  AORTA Ao Root diam: 3.40 cm MITRAL VALVE               TRICUSPID VALVE MV Area (PHT): 3.36 cm    TR Peak grad:   27.7 mmHg MV Area VTI:   3.48 cm    TR Vmax:        263.00 cm/s MV Peak grad:  4.2 mmHg MV Mean grad:  2.0 mmHg    SHUNTS MV Vmax:       1.03 m/s    Systemic VTI:  0.26 m MV Vmean:      59.5 cm/s   Systemic Diam: 2.00 cm MV Decel Time: 226 msec    Pulmonic VTI:  0.149 m MV E velocity: 73.70 cm/s MV A velocity: 69.00 cm/s MV E/A ratio:  1.07 Isaias Cowman MD Electronically signed by Isaias Cowman MD Signature Date/Time: 09/19/2021/1:40:49 PM    Final    [place] EKG SITE RITE  Result Date: 09/20/2021 If Site Rite image not attached, placement could not be confirmed due to current cardiac rhythm.    Echo LVEF 65 to 70% 09/19/2021.  Transesophageal echocardiogram 09/19/2021 did not reveal evidence for valvular vegetations.  TELEMETRY: Sinus rhythm 83 bpm:  ASSESSMENT AND PLAN:  Principal Problem:   Cellulitis of left lower extremity Active Problems:    Essential hypertension   Type 2 diabetes mellitus with stage 3b chronic kidney disease, without long-term current use of insulin (HCC)   Acute renal failure superimposed on stage 3b chronic kidney disease (HCC)   Hypothyroidism   Elevated troponin level not due myocardial infarction   AF (paroxysmal atrial fibrillation) (HCC)   Fever    1.  Group B streptococcal bacteremia, on ceftriaxone, followed by Dr. Steva Ready, without evidence for SBE  by TEE 2.  Paroxysmal atrial fibrillation, on Eliquis for stroke prevention, on amiodarone and metoprolol succinate for rate and rhythm control, currently in sinus rhythm  Recommendations  1.  Agree with current therapy 2.  Continue Eliquis for stroke prevention 3.  Continue amiodarone and metoprolol succinate for rate and rhythm control 4.  Defer further cardiac diagnostics at this time  Sign off for now, please call if any questions   Isaias Cowman, MD, PhD, [facility] 09/21/2021 11:29 AM

## 2021-09-21 NOTE — Progress Notes (Signed)
Occupational Therapy Treatment Patient Details Name: Becky Trevino MRN: [id number] DOB: 23-Mar-1946 Today's Date: 09/21/2021   History of present illness 76 year old female presenting from home with past medical history of paroxysmal atrial fibrillation (on Eliquis), hyperlipidemia, hypertension, history of SVT, pulmonary hypertension, chronic kidney disease stage IIIB, non-insulin-dependent diabetes mellitus type 2, obesity, obstructive sleep apnea, chronic DVT of the right lower extremity status post IVC filter placement, lymphedema, hypothyroidism who presents to [facility] emergency department with complaints of fevers and weakness.  Presents with 3 days of fever, malaise noted to have left leg cellulitis improving with IV antibiotics as below.   OT comments  Chart reviewed, RN cleared pt for participation in OT tx session and pre medicated with pain meds prior to session starting. Pt greeted in bed, agreeable to OT tx session. Tx session targeted progressing independent ADL completion and functional mobility via ADL tasks. Of note, is able to complete supine>sit with MOD A, attempted STS however unable to clear buttocks off of bed with MAX A despite raised bed and use of RW. Pt tolerated seated ADLs at edge of bed for approx 10 minutes. Pt required total A for peri care supine in bed as pt is noted to be soiled. Pt is able to complete rolling with MOD A for care. Pt appears to be making progress towards goals..    Recommendations for follow up therapy are one component of a multi-disciplinary discharge planning process, led by the attending physician.  Recommendations may be updated based on patient status, additional functional criteria and insurance authorization.    Follow Up Recommendations  Skilled nursing-short term rehab (<3 hours/day)    Assistance Recommended at Discharge Intermittent Supervision/Assistance  Patient can return home with the following  Two people to help with walking and/or  transfers;Two people to help with bathing/dressing/bathroom   Equipment Recommendations  Other (comment) (per next venue of care)    Recommendations for Other Services      Precautions / Restrictions Precautions Precautions: Fall Restrictions Weight Bearing Restrictions: No       Mobility Bed Mobility                    Transfers                         Balance                                           ADL either performed or assessed with clinical judgement   ADL Overall ADL's : Needs assistance/impaired                                       General ADL Comments: MOD A sup>sit, sit at EOB for ADL tasks for approx 10 minutes, TOTAL A for peri care, MIN A for UB dressing seated at EOB, SETUP for grooming tasks seated at EOB, attempted STS, unable to clear bottom off bed with raised bed, use of RW, Rolling completed with MOD A.    Extremity/Trunk Assessment              Vision       Perception     Praxis      Cognition Arousal/Alertness: Awake/alert Behavior During Therapy: WFL for tasks assessed/performed Overall Cognitive  Status: Within Functional Limits for tasks assessed                                 General Comments: A&Ox4.          Exercises     Shoulder Instructions       General Comments      Pertinent Vitals/ Pain       Pain Assessment: 0-10 Pain Score: 8  Pain Location: R hip and knee Pain Descriptors / Indicators: Discomfort;Grimacing Pain Intervention(s): Limited activity within patient's tolerance;Monitored during session;Premedicated before session  Home Living                                          Prior Functioning/Environment              Frequency  Min 2X/week        Progress Toward Goals  OT Goals(current goals can now be found in the care plan section)  Progress towards OT goals: Progressing toward goals  Acute Rehab  OT Goals Patient Stated Goal: to go home OT Goal Formulation: With patient Time For Goal Achievement: 10/05/21 Potential to Achieve Goals: [place] Discharge plan remains appropriate;Frequency remains appropriate    Co-evaluation                 AM-PAC OT "6 Clicks" Daily Activity     Outcome Measure   Help from another person eating meals?: None Help from another person taking care of personal grooming?: A Little Help from another person toileting, which includes using toliet, bedpan, or urinal?: Total Help from another person bathing (including washing, rinsing, drying)?: A Lot Help from another person to put on and taking off regular upper body clothing?: A Little Help from another person to put on and taking off regular lower body clothing?: A Lot 6 Click Score: 15    End of Session Equipment Utilized During Treatment: Gait belt;Rolling walker (2 wheels)  OT Visit Diagnosis: Muscle weakness (generalized) (M62.81);Pain   Activity Tolerance Patient limited by pain   Patient Left in bed;with call bell/phone within reach;with bed alarm set   Nurse Communication Mobility status        Time: 7614-7092 OT Time Calculation (min): 29 min  Charges: OT General Charges $OT Visit: 1 Visit OT Treatments $Self Care/Home Management : 23-37 mins  Shanon Payor, OTD OTR/L  09/21/21, 3:57 PM

## 2021-09-21 NOTE — Plan of Care (Signed)

## 2021-09-21 NOTE — Progress Notes (Signed)
PROGRESS NOTE    JAMIYAH DINGLEY  DVV:[id number] DOB: 1945-10-02 DOA: 09/14/2021 PCP: [name], MD    Brief Narrative:  Mrs. Hausler was admitted to the hospital with the working diagnosis of recurrent group B streptococcus bacteremia. Left lower extremity cellulitis.   76 yo female with the past medical history of paroxysmal atrial fibrillation, HTN, dyslipidemia, HTN, SVT, pulmonary HTN, CKD stage 3b, T2DM, chronic DVT and obesity who presented with fever. Reported 3 days of recurrent fever, associated with malaise and weakness. Positive right hip pain.   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   On her initial physical examination her blood pressure was 106/61, HR 79, RR14 and oxygen saturation 97% on room air, her lungs were clear to auscultation, heart with S1 and S2 present, abdomen soft and non tender, and no lower extremity edema.   Sodium 135, potassium 3.8, chloride 99, bicarb 26, glucose 106, BUN 23, creatinine 1.51, white count 8.5, hemoglobin 12.0, hematocrit 37.3, platelets 137. SARS COVID-19 negative.  Urinalysis specific gravity 1.024, 0 having 5 white cells. Chest radiograph with bilateral atelectasis at bases more on the right.   EKG 83 bpm, normal axis with normal intervals, sinus rhythm, no significant St segment or T wave changes.   Blood cultures positive for streptococcus group B, and placed on antibiotic therapy.   01/05 CT guided aspiration  Plan to continue IV ceftriaxone for a total of 6 weeks.  Pending transfer to SNF.   Assessment & Plan:   Principal Problem:   Cellulitis of left lower extremity Active Problems:   Essential hypertension   Type 2 diabetes mellitus with stage 3b chronic kidney disease, without long-term current use of insulin (HCC)   Acute renal failure superimposed on stage 3b chronic kidney disease (HCC)   Hypothyroidism   Elevated troponin level not due myocardial infarction   AF (paroxysmal atrial fibrillation)  (HCC)   Fever   Recurrent group B streptococcus bacteremia, right hip infection.  TEE negative for vegetation Right hip with 4000 wbc Cultures continue with no growth from joint aspirate   Patient has a PICC line in place Plan to complete 6 weeks of ceftriaxone IV for antibiotic therapy Pending transfer to SNF  2, AKI on CKD sage 3b Renal function has been stable with serum cr at 0,9, with K at 3,7 and bicarbonate at 26  3. Atrial fibrillation (paroxysmal) Continue rate control.  Elevated troponin, no acs.   4. HTN Continue blood pressure control.   5. T2DM with dyslipidemia  Fasting glucose yesterday was 94, continue insulin therapy   6. Obesity class 3, bilateral lower extremity lymphedema.  Calculated BMI is 41,8 Continue outpatient follow up .  7. Anemia of chronic disease. Hgb is 8,5 and hct 25,5  Continue close follow up.   Status is: Inpatient  Remains inpatient appropriate because: Pending transfer to SNF    DVT prophylaxis: Enoxaparin   Code Status:    full  Family Communication:   No family at the bedside      Consultants:  ID  Orthopedics  Cardiology for TEE    Procedures:  Joint aspiration   Antimicrobials:  Ceftriaxone     Subjective: Patient with no nausea or vomiting, right hip pain has improved but not back to baseline, continue to have difficulty with mobility   Objective: Vitals:   09/21/21 0327 09/21/21 0827 09/21/21 0828 09/21/21 1118  BP: (!) 101/52 (!) 97/58 (!) 97/58 107/60  Pulse: 81 78 78 79  Resp:  16  16 17   Temp: 98.4 F (36.9 C)  98.6 F (37 C) 98.5 F (36.9 C)  TempSrc:      SpO2: 94%  96% 97%  Weight:      Height:        Intake/Output Summary (Last 24 hours) at 09/21/2021 1511 Last data filed at 09/20/2021 1900 Gross per 24 hour  Intake 240 ml  Output --  Net 240 ml   Filed Weights   09/14/21 1654  Weight: 136.1 kg    Examination:   General: Not in pain or dyspnea, deconditioned  Neurology: Awake and  alert, non focal  E ENT: no pallor, no icterus, oral mucosa moist Cardiovascular: No JVD. S1-S2 present, rhythmic, no gallops, rubs, or murmurs. Positive bilateral non pitting lower extremity edema. Pulmonary: positive breath sounds bilaterally, adequate air movement, no wheezing, rhonchi or rales. Gastrointestinal. Abdomen soft and non tender Skin. No rashes Musculoskeletal: no joint deformities     Data Reviewed: I have personally reviewed following labs and imaging studies  CBC: Recent Labs  Lab 09/14/21 1657 09/15/21 0533 09/16/21 0312 09/17/21 0505 09/18/21 0434 09/19/21 0447 09/20/21 0639  WBC 8.5 8.6 9.0 8.3 9.7 9.2 10.8*  NEUTROABS 7.6 7.3  --   --   --   --   --   HGB 12.0 11.3* 10.3* 10.5* 9.4* 8.9* 8.5*  HCT 37.3 34.6* 33.0* 31.6* 29.1* 27.8* 25.5*  MCV 98.2 96.1 98.8 96.0 95.7 94.9 95.5  PLT 137* 126* 121* 145* 157 199 440   Basic Metabolic Panel: Recent Labs  Lab 09/15/21 0533 09/16/21 0312 09/17/21 0505 09/18/21 0434 09/19/21 0447 09/20/21 0639  NA 132* 135 134* 134* 134* 134*  K 3.7 4.1 3.6 3.7 3.8 3.7  CL 101 103 103 102 99 101  CO2 22 24 23 26 25 26   GLUCOSE 100* 84 102* 102* 91 94  BUN 22 23 23 21 20 16   CREATININE 1.31* 1.39* 1.28* 1.28* 1.06* 0.99  CALCIUM 8.8* 8.7* 8.5* 8.6* 8.5* 8.2*  MG 2.1  --   --   --   --   --    GFR: Estimated Creatinine Clearance: 75.1 mL/min (by C-G formula based on SCr of 0.99 mg/dL). Liver Function Tests: Recent Labs  Lab 09/14/21 1657 09/15/21 0533  AST 29 21  ALT 20 18  ALKPHOS 66 60  BILITOT 1.1 1.1  PROT 8.2* 7.5  ALBUMIN 3.7 3.2*   No results for input(s): LIPASE, AMYLASE in the last 168 hours. No results for input(s): AMMONIA in the last 168 hours. Coagulation Profile: Recent Labs  Lab 09/19/21 0821  INR 1.8*   Cardiac Enzymes: No results for input(s): CKTOTAL, CKMB, CKMBINDEX, TROPONINI in the last 168 hours. BNP (last 3 results) No results for input(s): PROBNP in the last 8760  hours. HbA1C: No results for input(s): HGBA1C in the last 72 hours. CBG: Recent Labs  Lab 09/20/21 1137 09/20/21 1539 09/20/21 2231 09/21/21 0743 09/21/21 1121  GLUCAP 85 94 104* 89 91   Lipid Profile: No results for input(s): CHOL, HDL, LDLCALC, TRIG, CHOLHDL, LDLDIRECT in the last 72 hours. Thyroid Function Tests: No results for input(s): TSH, T4TOTAL, FREET4, T3FREE, THYROIDAB in the last 72 hours. Anemia Panel: No results for input(s): VITAMINB12, FOLATE, FERRITIN, TIBC, IRON, RETICCTPCT in the last 72 hours.    Radiology Studies: I have reviewed all of the imaging during this hospital visit personally     Scheduled Meds:  amiodarone  200 mg Oral BID  apixaban  5 mg Oral BID   aspirin EC  81 mg Oral Daily   atorvastatin  40 mg Oral QPM   brinzolamide  1 drop Both Eyes TID   And   brimonidine  1 drop Both Eyes TID   Chlorhexidine Gluconate Cloth  6 each Topical Daily   escitalopram  5 mg Oral Daily   fluticasone  2 spray Each Nare Daily   insulin aspart  0-15 Units Subcutaneous TID AC & HS   latanoprost  1 drop Both Eyes QHS   levothyroxine  25 mcg Oral Q0600   metoprolol succinate  25 mg Oral BID   mirabegron ER  50 mg Oral Daily   sodium chloride flush  10-40 mL Intracatheter Q12H   Continuous Infusions:  sodium chloride Stopped (09/18/21 0603)     LOS: 6 days        Tarquin Welcher Gerome Apley, MD

## 2021-09-22 ENCOUNTER — Other Ambulatory Visit: Payer: Self-pay | Admitting: Family Medicine

## 2021-09-22 DIAGNOSIS — F410 Panic disorder [episodic paroxysmal anxiety] without agoraphobia: Secondary | ICD-10-CM

## 2021-09-22 DIAGNOSIS — F064 Anxiety disorder due to known physiological condition: Secondary | ICD-10-CM

## 2021-09-22 LAB — CULTURE, BLOOD (ROUTINE X 2): Culture: NO GROWTH

## 2021-09-22 LAB — GLUCOSE, CAPILLARY
Glucose-Capillary: 107 mg/dL — ABNORMAL HIGH (ref 70–99)
Glucose-Capillary: 92 mg/dL (ref 70–99)
Glucose-Capillary: 92 mg/dL (ref 70–99)
Glucose-Capillary: 98 mg/dL (ref 70–99)

## 2021-09-22 MED ORDER — SODIUM CHLORIDE 0.9 % IV SOLN
2.0000 g | Freq: Every day | INTRAVENOUS | Status: DC
  Administered 2021-09-22 – 2021-09-24 (×3): 2 g via INTRAVENOUS
  Filled 2021-09-22: qty 20
  Filled 2021-09-22: qty 2
  Filled 2021-09-22: qty 20

## 2021-09-22 NOTE — TOC Progression Note (Signed)
Transition of Care [facility]) - Progression Note    Patient Details  Name: Becky Trevino MRN: [id number] Date of Birth: Jan 30, 1946  Transition of Care [facility]) CM/SW [place], LCSW Phone Number: 09/22/2021, 10:23 AM  Clinical Narrative:  Provided patient with bed offers. First preference is [facility]. They have not responded to referral yet. Left message for admissions coordinator asking her to review. If they are unable to accept her, [facility] is second preference. They have made a bed offer.  Expected Discharge Plan and Services                                                 Social Determinants of Health (SDOH) Interventions    Readmission Risk Interventions No flowsheet data found.

## 2021-09-22 NOTE — Consult Note (Signed)
PHARMACY CONSULT NOTE FOR:  OUTPATIENT  PARENTERAL ANTIBIOTIC THERAPY (OPAT)  Indication: Recurrent Group B Streptococcal bacteremia / septic arthritis of right hip / extensive hardware Regimen: Ceftriaxone 2 g IV q24h thru 10/26/21 and then amoxicillin 500 mg PO TID to start on 10/27/21 End date: 10/26/21 (for IV antibiotics)  IV antibiotic discharge orders are pended. To discharging provider:  please sign these orders via discharge navigator,  Select New Orders & click on the button choice - Manage This Unsigned Work.     Thank you for allowing pharmacy to be a part of this patient's care.  Benita Gutter 09/22/2021, 11:44 AM

## 2021-09-22 NOTE — Plan of Care (Signed)

## 2021-09-22 NOTE — Telephone Encounter (Signed)
Requested medication (s) are due for refill today: yes  Requested medication (s) are on the active medication list: yes  Last refill:  07/23/21 #30 1 RF  Future visit scheduled: yes  Notes to clinic:  pt is currently hospitalized- do not see in chart that she saw Dr. Clayborn Bigness   Requested Prescriptions  Pending Prescriptions Disp Refills   escitalopram (LEXAPRO) 5 MG tablet [Pharmacy Med Name: ESCITALOPRAM 5 MG TABLET] 90 tablet 1    Sig: TAKE 1 TABLET BY MOUTH DAILY FOR ANXIETY. WAIT UNTIL FOLLOW UP WITH DR. CALLWOOD TO START THIS MED     Psychiatry:  Antidepressants - SSRI Passed - 09/22/2021 10:33 AM      Passed - Valid encounter within last 6 months    Recent Outpatient Visits           2 months ago Hospital discharge follow-up   [facility] [name], MD   3 months ago Type 2 diabetes mellitus with diabetic neuropathy, without long-term current use of insulin [facility])   [facility] Time, Drue Stager, MD   7 months ago Type 2 diabetes mellitus with diabetic neuropathy, without long-term current use of insulin [facility])   [facility] [name], MD   8 months ago Gait difficulty   [facility] [place], Drue Stager, MD   10 months ago Type 2 diabetes mellitus with diabetic neuropathy, without long-term current use of insulin [facility])   [facility] [name], MD       Future Appointments             In 2 weeks Ancil Boozer, Drue Stager, MD [facility], [place]   In 2 weeks [name], MD [place]   In 9 months [name], Gordan Payment [place]   In 10 months  [facility], [facility]

## 2021-09-22 NOTE — Progress Notes (Signed)
PROGRESS NOTE    Becky Trevino  EAV:[id number] DOB: December 27, 1945 DOA: 09/14/2021 PCP: [name], MD    Brief Narrative:  Becky Trevino was admitted to the hospital with the working diagnosis of recurrent group B streptococcus bacteremia. Left lower extremity cellulitis.    76 yo female with the past medical history of paroxysmal atrial fibrillation, HTN, dyslipidemia, HTN, SVT, pulmonary HTN, CKD stage 3b, T2DM, chronic DVT and obesity who presented with fever. Reported 3 days of recurrent fever, associated with malaise and weakness. Positive right hip pain.    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    On her initial physical examination her blood pressure was 106/61, HR 79, RR14 and oxygen saturation 97% on room air, her lungs were clear to auscultation, heart with S1 and S2 present, abdomen soft and non tender, and no lower extremity edema.    Sodium 135, potassium 3.8, chloride 99, bicarb 26, glucose 106, BUN 23, creatinine 1.51, white count 8.5, hemoglobin 12.0, hematocrit 37.3, platelets 137. SARS COVID-19 negative.   Urinalysis specific gravity 1.024, 0 having 5 white cells. Chest radiograph with bilateral atelectasis at bases more on the right.    EKG 83 bpm, normal axis with normal intervals, sinus rhythm, no significant St segment or T wave changes.    Blood cultures positive for streptococcus group B, and placed on antibiotic therapy.    01/05 TEE with no vegetations, preserved LV systolic function.  01/05 CT guided aspiration right hip, culture with no growth. Gram stain with no organisms.  Plan to continue IV ceftriaxone for a total of 6 weeks.  Pending transfer to SNF.    Assessment & Plan:   Principal Problem:   Cellulitis of left lower extremity Active Problems:   Essential hypertension   Type 2 diabetes mellitus with stage 3b chronic kidney disease, without long-term current use of insulin (HCC)   Acute renal failure superimposed on stage 3b  chronic kidney disease (HCC)   Hypothyroidism   Elevated troponin level not due myocardial infarction   AF (paroxysmal atrial fibrillation) (HCC)   Fever   Recurrent group B streptococcus bacteremia, right hip infection.  TEE negative for vegetation Right hip with 4000 wbc Cultures continue with no growth from joint aspirate    Plan for 6 weeks of ceftriaxone IV for antibiotic therapy, per PICC line. Continue pain control with oxycodone.  Pending transfer to SNF   2, AKI on CKD sage 3b Acute kidney injury has resolved, renal function has been stable. Patient is tolerating po well.    3. Atrial fibrillation (paroxysmal)l.  Elevated troponin, no acs.  Continue rate control atrial fibrillation with metoprolol and amiodarone.   Anticoagulation with apixaban, hold on aspirin due to risk of bleeding.    4. HTN Blood pressure has been stable.    5. T2DM with dyslipidemia capillary glucose 111, 106, 107 and 92 Patient is tolerating po well.  Continue with insulin sliding scale, for glucose cover and monitoring.  Patient at home diet control.   Continue with atorvastatin.    6. Obesity class 3, bilateral lower extremity lymphedema.  Calculated BMI is 41,8   7. Anemia of chronic disease. Cell count has been stable.   8. Depression. Continue with escitalopram.   9. Hypothyroid,. Continue with levothyroxine   Status is: Inpatient  Remains inpatient appropriate because: Pending transfer to SNF   DVT prophylaxis: Apixaban   Code Status:   full  Family Communication:   No family at the bedside  Consultants:  Podiatry  ID   Procedures:   As above   Antimicrobials:   Ceftriaxone     Subjective: Patient is feeling better, but continue to be very weak and deconditioned, not back to her baseline, right hip pain is controlled.   Objective: Vitals:   09/21/21 2055 09/21/21 2207 09/22/21 0609 09/22/21 0758  BP: (!) 115/53 (!) 98/52 114/60 (!) 109/59  Pulse: 87 83 86  84  Resp: 20 16 16 20   Temp: 98.9 F (37.2 C) 98.9 F (37.2 C) 99.2 F (37.3 C) 98.8 F (37.1 C)  TempSrc: Oral   Oral  SpO2: 99% 95% 96% 94%  Weight:      Height:        Intake/Output Summary (Last 24 hours) at 09/22/2021 1306 Last data filed at 09/22/2021 2536 Gross per 24 hour  Intake --  Output 500 ml  Net -500 ml   Filed Weights   09/14/21 1654  Weight: 136.1 kg    Examination:   General: Not in pain or dyspnea, deconditioned  Neurology: Awake and alert, non focal  E ENT: mild pallor, no icterus, oral mucosa moist Cardiovascular: No JVD. S1-S2 present, rhythmic, no gallops, rubs, or murmurs. Bilateral non pitting ++ lower extremity edema. Pulmonary: positive breath sounds bilaterally, adequate air movement, no wheezing, rhonchi or rales. Gastrointestinal. Abdomen soft and non tender Skin. No rashes Musculoskeletal: no joint deformities     Data Reviewed: I have personally reviewed following labs and imaging studies  CBC: Recent Labs  Lab 09/16/21 0312 09/17/21 0505 09/18/21 0434 09/19/21 0447 09/20/21 0639  WBC 9.0 8.3 9.7 9.2 10.8*  HGB 10.3* 10.5* 9.4* 8.9* 8.5*  HCT 33.0* 31.6* 29.1* 27.8* 25.5*  MCV 98.8 96.0 95.7 94.9 95.5  PLT 121* 145* 157 199 644   Basic Metabolic Panel: Recent Labs  Lab 09/16/21 0312 09/17/21 0505 09/18/21 0434 09/19/21 0447 09/20/21 0639  NA 135 134* 134* 134* 134*  K 4.1 3.6 3.7 3.8 3.7  CL 103 103 102 99 101  CO2 24 23 26 25 26   GLUCOSE 84 102* 102* 91 94  BUN 23 23 21 20 16   CREATININE 1.39* 1.28* 1.28* 1.06* 0.99  CALCIUM 8.7* 8.5* 8.6* 8.5* 8.2*   GFR: Estimated Creatinine Clearance: 75.1 mL/min (by C-G formula based on SCr of 0.99 mg/dL). Liver Function Tests: No results for input(s): AST, ALT, ALKPHOS, BILITOT, PROT, ALBUMIN in the last 168 hours. No results for input(s): LIPASE, AMYLASE in the last 168 hours. No results for input(s): AMMONIA in the last 168 hours. Coagulation Profile: Recent Labs  Lab  09/19/21 0821  INR 1.8*   Cardiac Enzymes: No results for input(s): CKTOTAL, CKMB, CKMBINDEX, TROPONINI in the last 168 hours. BNP (last 3 results) No results for input(s): PROBNP in the last 8760 hours. HbA1C: No results for input(s): HGBA1C in the last 72 hours. CBG: Recent Labs  Lab 09/21/21 1121 09/21/21 1631 09/21/21 2205 09/22/21 0754 09/22/21 1107  GLUCAP 91 111* 106* 107* 92   Lipid Profile: No results for input(s): CHOL, HDL, LDLCALC, TRIG, CHOLHDL, LDLDIRECT in the last 72 hours. Thyroid Function Tests: No results for input(s): TSH, T4TOTAL, FREET4, T3FREE, THYROIDAB in the last 72 hours. Anemia Panel: No results for input(s): VITAMINB12, FOLATE, FERRITIN, TIBC, IRON, RETICCTPCT in the last 72 hours.    Radiology Studies: I have reviewed all of the imaging during this hospital visit personally     Scheduled Meds:  amiodarone  200 mg Oral BID   apixaban  5 mg Oral BID   aspirin EC  81 mg Oral Daily   atorvastatin  40 mg Oral QPM   brinzolamide  1 drop Both Eyes TID   And   brimonidine  1 drop Both Eyes TID   Chlorhexidine Gluconate Cloth  6 each Topical Daily   escitalopram  5 mg Oral Daily   fluticasone  2 spray Each Nare Daily   insulin aspart  0-15 Units Subcutaneous TID AC & HS   latanoprost  1 drop Both Eyes QHS   levothyroxine  25 mcg Oral Q0600   metoprolol succinate  25 mg Oral BID   mirabegron ER  50 mg Oral Daily   sodium chloride flush  10-40 mL Intracatheter Q12H   Continuous Infusions:  sodium chloride Stopped (09/18/21 0603)   cefTRIAXone (ROCEPHIN)  IV 2 g (09/22/21 1100)     LOS: 7 days        Danel Studzinski Gerome Apley, MD

## 2021-09-23 LAB — CBC WITH DIFFERENTIAL/PLATELET
Abs Immature Granulocytes: 0.62 10*3/uL — ABNORMAL HIGH (ref 0.00–0.07)
Basophils Absolute: 0 10*3/uL (ref 0.0–0.1)
Basophils Relative: 0 %
Eosinophils Absolute: 0.1 10*3/uL (ref 0.0–0.5)
Eosinophils Relative: 1 %
HCT: 25.5 % — ABNORMAL LOW (ref 36.0–46.0)
Hemoglobin: 8.2 g/dL — ABNORMAL LOW (ref 12.0–15.0)
Immature Granulocytes: 6 %
Lymphocytes Relative: 9 %
Lymphs Abs: 1 10*3/uL (ref 0.7–4.0)
MCH: 30.6 pg (ref 26.0–34.0)
MCHC: 32.2 g/dL (ref 30.0–36.0)
MCV: 95.1 fL (ref 80.0–100.0)
Monocytes Absolute: 0.6 10*3/uL (ref 0.1–1.0)
Monocytes Relative: 5 %
Neutro Abs: 8.2 10*3/uL — ABNORMAL HIGH (ref 1.7–7.7)
Neutrophils Relative %: 79 %
Platelets: 313 10*3/uL (ref 150–400)
RBC: 2.68 MIL/uL — ABNORMAL LOW (ref 3.87–5.11)
RDW: 18.3 % — ABNORMAL HIGH (ref 11.5–15.5)
Smear Review: NORMAL
WBC: 10.5 10*3/uL (ref 4.0–10.5)
nRBC: 1.2 % — ABNORMAL HIGH (ref 0.0–0.2)

## 2021-09-23 LAB — BASIC METABOLIC PANEL
Anion gap: 7 (ref 5–15)
BUN: 18 mg/dL (ref 8–23)
CO2: 26 mmol/L (ref 22–32)
Calcium: 7.8 mg/dL — ABNORMAL LOW (ref 8.9–10.3)
Chloride: 98 mmol/L (ref 98–111)
Creatinine, Ser: 0.89 mg/dL (ref 0.44–1.00)
GFR, Estimated: >60 mL/min (ref >60)
Glucose, Bld: 90 mg/dL (ref 70–99)
Potassium: 3.5 mmol/L (ref 3.5–5.1)
Sodium: 131 mmol/L — ABNORMAL LOW (ref 135–145)

## 2021-09-23 LAB — GLUCOSE, CAPILLARY
Glucose-Capillary: 79 mg/dL (ref 70–99)
Glucose-Capillary: 100 mg/dL — ABNORMAL HIGH (ref 70–99)
Glucose-Capillary: 112 mg/dL — ABNORMAL HIGH (ref 70–99)
Glucose-Capillary: 112 mg/dL — ABNORMAL HIGH (ref 70–99)

## 2021-09-23 LAB — RESP PANEL BY RT-PCR (FLU A&B, COVID) ARPGX2
Influenza A by PCR: NEGATIVE
Influenza B by PCR: NEGATIVE
SARS Coronavirus 2 by RT PCR: NEGATIVE

## 2021-09-23 NOTE — TOC Progression Note (Signed)
Transition of Care [facility]) - Progression Note    Patient Details  Name: Becky Trevino MRN: [id number] Date of Birth: Nov 22, 1945  Transition of Care [facility]) CM/SW [place], RN Phone Number: 09/23/2021, 1:34 PM  Clinical Narrative:   The patient was offered the bed at [facility] as she requested, Ins Josem Kaufmann was started for approval to go to [place], Ref number [id number]         Expected Discharge Plan and Services                                                 Social Determinants of Health (SDOH) Interventions    Readmission Risk Interventions No flowsheet data found.

## 2021-09-23 NOTE — Progress Notes (Signed)
Physical Therapy Treatment Patient Details Name: Becky Trevino MRN: [id number] DOB: 11-23-1945 Today's Date: 09/23/2021   History of Present Illness 76 year old female presenting from home with past medical history of paroxysmal atrial fibrillation (on Eliquis), hyperlipidemia, hypertension, history of SVT, pulmonary hypertension, chronic kidney disease stage IIIB, non-insulin-dependent diabetes mellitus type 2, obesity, obstructive sleep apnea, chronic DVT of the right lower extremity status post IVC filter placement, lymphedema, hypothyroidism who presents to [facility] emergency department with complaints of fevers and weakness.  Presents with 3 days of fever, malaise noted to have left leg cellulitis improving with IV antibiotics as below.    PT Comments    Pt received in bed agreeable to PT. Reports R hip pain 8/10 NPS. Pt tolerating LE therex fair with need for AAROM for RLE. Does require HOB elevated and use of bed features with minA at chuck pad to move hips towards EOB. With BRW and bed significantly elevated, able to perform x3 STS with UE's on RW for leverage. Able to tolerate all bouts for ~30 sec standing but does heavily rely on UE's on RW to maintain standing despite TC's at pelvis and shoulders to promote upright posture.  Seated rest provided between bouts. Bed soiled in urine so RN called to assist in linens change. maxA+1 to return to supine for LE management and minA+1 to roll to R. MaxA+2 to return to supine in bed with bed in trendelenburg. All needs in reach.  Pt does seem to be progressing in strength/global tolerance to therapy but STR remains appropriate as pt still remains unable to transfer from surface to surface and is unable ambulate short distances at this time which is pt's baseline mobility.    Recommendations for follow up therapy are one component of a multi-disciplinary discharge planning process, led by the attending physician.  Recommendations may be updated based on  patient status, additional functional criteria and insurance authorization.  Follow Up Recommendations  Skilled nursing-short term rehab (<3 hours/day)     Assistance Recommended at Discharge Frequent or constant Supervision/Assistance  Patient can return home with the following A lot of help with walking and/or transfers;A lot of help with bathing/dressing/bathroom;Assist for transportation   Equipment Recommendations  None recommended by PT    Recommendations for Other Services       Precautions / Restrictions Precautions Precautions: Fall Restrictions Weight Bearing Restrictions: No     Mobility  Bed Mobility Overal bed mobility: Needs Assistance Bed Mobility: Supine to Sit;Sit to Supine     Supine to sit: Min assist;HOB elevated Sit to supine: Max assist;+2 for physical assistance   General bed mobility comments: MaxA for LEs to return to bed. maxA+2 to return to scoot up in bed. Patient Response: Cooperative  Transfers Overall transfer level: Needs assistance Equipment used: Rolling walker (2 wheels) (bariatric) Transfers: Sit to/from Stand Sit to Stand: Mod assist;From elevated surface           General transfer comment: Requires use of momentum to stand. Tolerating x3 with 30 sec bouts.    Ambulation/Gait                   Stairs             Wheelchair Mobility    Modified Rankin (Stroke Patients Only)       Balance Overall balance assessment: Needs assistance Sitting-balance support: Feet supported;No upper extremity supported Sitting balance-Leahy Scale: Fair     Standing balance support: Bilateral upper extremity supported;Reliant on assistive  device for balance Standing balance-Leahy Scale: Poor Standing balance comment: Heavy forward trunk lean for BUE support on BRW to maintain standing.                            Cognition Arousal/Alertness: Awake/alert Behavior During Therapy: WFL for tasks  assessed/performed Overall Cognitive Status: Within Functional Limits for tasks assessed                                          Exercises General Exercises - Lower Extremity Ankle Circles/Pumps: AROM;Supine;Both;20 reps Hip ABduction/ADduction: AAROM;10 reps;Both;Supine    General Comments        Pertinent Vitals/Pain Pain Assessment: 0-10 Pain Score: 8  Pain Location: R hip and knee Pain Descriptors / Indicators: Discomfort;Grimacing Pain Intervention(s): Limited activity within patient's tolerance;Monitored during session;Repositioned;Premedicated before session    Home Living                          Prior Function            PT Goals (current goals can now be found in the care plan section) Acute Rehab PT Goals Patient Stated Goal: improve pain and go home PT Goal Formulation: With patient Time For Goal Achievement: 09/30/21 Potential to Achieve Goals: Fair Progress towards PT goals: Progressing toward goals    Frequency    Min 2X/week      PT Plan Current plan remains appropriate    Co-evaluation              AM-PAC PT "6 Clicks" Mobility   Outcome Measure  Help needed turning from your back to your side while in a flat bed without using bedrails?: A Lot Help needed moving from lying on your back to sitting on the side of a flat bed without using bedrails?: Total Help needed moving to and from a bed to a chair (including a wheelchair)?: Total Help needed standing up from a chair using your arms (e.g., wheelchair or bedside chair)?: A Lot Help needed to walk in hospital room?: Total Help needed climbing 3-5 steps with a railing? : Total 6 Click Score: 8    End of Session Equipment Utilized During Treatment: Gait belt Activity Tolerance: Patient limited by pain Patient left: in bed;with call bell/phone within reach;with bed alarm set Nurse Communication: Mobility status PT Visit Diagnosis: Other abnormalities of  gait and mobility (R26.89);Muscle weakness (generalized) (M62.81);Pain;Difficulty in walking, not elsewhere classified (R26.2) Pain - Right/Left: Right Pain - part of body: Hip     Time: 8546-2703 PT Time Calculation (min) (ACUTE ONLY): 33 min  Charges:  $Therapeutic Exercise: 8-22 mins $Therapeutic Activity: 8-22 mins                    Emmagene Ortner M. Fairly IV, PT, DPT Physical Therapist- [facility]  09/23/2021, 12:42 PM

## 2021-09-23 NOTE — Plan of Care (Signed)

## 2021-09-23 NOTE — Progress Notes (Signed)
PROGRESS NOTE    Becky Trevino  JAS:[id number] DOB: 07/08/1946 DOA: 09/14/2021 PCP: [name], MD    Brief Narrative:  Becky Trevino was admitted to the hospital with the working diagnosis of recurrent group B streptococcus bacteremia. Left lower extremity cellulitis.    76 yo female with the past medical history of paroxysmal atrial fibrillation, HTN, dyslipidemia, HTN, SVT, pulmonary HTN, CKD stage 3b, T2DM, chronic DVT and obesity who presented with fever. Reported 3 days of recurrent fever, associated with malaise and weakness. Positive right hip pain.    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 Blood cultures positive for streptococcus group B, and placed on antibiotic therapy.    01/05 TEE with no vegetations, preserved LV systolic function.  01/05 CT guided aspiration right hip, culture with no growth. Gram stain with no organisms.  Plan to continue IV ceftriaxone for a total of 6 weeks.  Pending transfer to SNF.    Assessment & Plan:   Principal Problem:   Cellulitis of left lower extremity Active Problems:   Essential hypertension   Type 2 diabetes mellitus with stage 3b chronic kidney disease, without long-term current use of insulin (HCC)   Acute renal failure superimposed on stage 3b chronic kidney disease (HCC)   Hypothyroidism   Elevated troponin level not due myocardial infarction   AF (paroxysmal atrial fibrillation) (HCC)   Fever  Recurrent group B streptococcus bacteremia, right hip infection.  TEE negative for vegetation Right hip with 4000 wbc Cultures continue with no growth from joint aspirate    Plan for 6 weeks of ceftriaxone IV for antibiotic therapy, per PICC line. Continue pain control with oxycodone.  Pending transfer to SNF 1/9: Offered bed at [facility].  Insurance authorization was started today   2, AKI on CKD sage 3b Acute kidney injury has resolved, renal function has been stable. Patient is tolerating po well.    3.  Atrial fibrillation (paroxysmal)l.  Elevated troponin, no acs.  Continue rate control atrial fibrillation with metoprolol and amiodarone.   Anticoagulation with apixaban, hold on aspirin due to risk of bleeding.    4. HTN Blood pressure has been stable.    5. T2DM with dyslipidemia Patient is tolerating po well.  Continue with insulin sliding scale, for glucose cover and monitoring.  Patient at home diet control.    Continue with atorvastatin.    6. Obesity class 3, bilateral lower extremity lymphedema.  Calculated BMI is 41,8   7. Anemia of chronic disease. Cell count has been stable.    8. Depression. Continue with escitalopram.    9. Hypothyroid,. Continue with levothyroxine    DVT prophylaxis: Eliquis Code Status: Full Family Communication: None today Disposition Plan: Status is: Inpatient  Remains inpatient appropriate because: Unsafe discharge plan.  Pending transfer to skilled nursing facility.  Patient medically stable for discharge at this time.       Level of care: Telemetry Medical  Consultants:  None  Procedures:  CT-guided aspiration right hip  Antimicrobials: Ceftriaxone   Subjective: Patient seen and examined.  Endorses good pain control.  No complaints  Objective: Vitals:   09/22/21 1555 09/22/21 2041 09/23/21 0623 09/23/21 0755  BP: (!) 105/58 (!) 100/54 (!) 103/57 (!) 100/49  Pulse: 82 83 77 78  Resp: 18 18 14 18   Temp: (!) 97.4 F (36.3 C) 99.2 F (37.3 C)  98.6 F (37 C)  TempSrc:  Oral    SpO2: 98% 96% 95% 98%  Weight:  Height:        Intake/Output Summary (Last 24 hours) at 09/23/2021 1400 Last data filed at 09/23/2021 1041 Gross per 24 hour  Intake 700 ml  Output 850 ml  Net -150 ml   Filed Weights   09/14/21 1654  Weight: 136.1 kg    Examination:  General exam: No acute distress.  Appears deconditioned Respiratory system: Lungs clear.  Normal work of breathing.  Room air Cardiovascular system: S1-S2, RRR, no  murmurs, BL nonpitting edema Gastrointestinal system: Soft, NT/ND, normal bowel sounds Central nervous system: Alert and oriented. No focal neurological deficits. Extremities: Symmetric 5 x 5 power.  Right upper extremity PICC Skin: No rashes, lesions or ulcers Psychiatry: Judgement and insight appear normal. Mood & affect appropriate.     Data Reviewed: I have personally reviewed following labs and imaging studies  CBC: Recent Labs  Lab 09/17/21 0505 09/18/21 0434 09/19/21 0447 09/20/21 0639 09/23/21 0802  WBC 8.3 9.7 9.2 10.8* 10.5  NEUTROABS  --   --   --   --  8.2*  HGB 10.5* 9.4* 8.9* 8.5* 8.2*  HCT 31.6* 29.1* 27.8* 25.5* 25.5*  MCV 96.0 95.7 94.9 95.5 95.1  PLT 145* 157 199 228 710   Basic Metabolic Panel: Recent Labs  Lab 09/17/21 0505 09/18/21 0434 09/19/21 0447 09/20/21 0639 09/23/21 0802  NA 134* 134* 134* 134* 131*  K 3.6 3.7 3.8 3.7 3.5  CL 103 102 99 101 98  CO2 23 26 25 26 26   GLUCOSE 102* 102* 91 94 90  BUN 23 21 20 16 18   CREATININE 1.28* 1.28* 1.06* 0.99 0.89  CALCIUM 8.5* 8.6* 8.5* 8.2* 7.8*   GFR: Estimated Creatinine Clearance: 83.5 mL/min (by C-G formula based on SCr of 0.89 mg/dL). Liver Function Tests: No results for input(s): AST, ALT, ALKPHOS, BILITOT, PROT, ALBUMIN in the last 168 hours. No results for input(s): LIPASE, AMYLASE in the last 168 hours. No results for input(s): AMMONIA in the last 168 hours. Coagulation Profile: Recent Labs  Lab 09/19/21 0821  INR 1.8*   Cardiac Enzymes: No results for input(s): CKTOTAL, CKMB, CKMBINDEX, TROPONINI in the last 168 hours. BNP (last 3 results) No results for input(s): PROBNP in the last 8760 hours. HbA1C: No results for input(s): HGBA1C in the last 72 hours. CBG: Recent Labs  Lab 09/22/21 1107 09/22/21 1648 09/22/21 2120 09/23/21 0752 09/23/21 1201  GLUCAP 92 92 98 79 100*   Lipid Profile: No results for input(s): CHOL, HDL, LDLCALC, TRIG, CHOLHDL, LDLDIRECT in the last 72  hours. Thyroid Function Tests: No results for input(s): TSH, T4TOTAL, FREET4, T3FREE, THYROIDAB in the last 72 hours. Anemia Panel: No results for input(s): VITAMINB12, FOLATE, FERRITIN, TIBC, IRON, RETICCTPCT in the last 72 hours. Sepsis Labs: No results for input(s): PROCALCITON, LATICACIDVEN in the last 168 hours.  Recent Results (from the past 240 hour(s))  Resp Panel by RT-PCR (Flu A&B, Covid) Nasopharyngeal Swab     Status: None   Collection Time: 09/14/21  4:57 PM   Specimen: Nasopharyngeal Swab; Nasopharyngeal(NP) swabs in vial transport medium  Result Value Ref Range Status   SARS Coronavirus 2 by RT PCR NEGATIVE NEGATIVE Final    Comment: (NOTE) SARS-CoV-2 target nucleic acids are NOT DETECTED.  The SARS-CoV-2 RNA is generally detectable in upper respiratory specimens during the acute phase of infection. The lowest concentration of SARS-CoV-2 viral copies this assay can detect is 138 copies/mL. A negative result does not preclude SARS-Cov-2 infection and should not be used as  the sole basis for treatment or other patient management decisions. A negative result may occur with  improper specimen collection/handling, submission of specimen other than nasopharyngeal swab, presence of viral mutation(s) within the areas targeted by this assay, and inadequate number of viral copies(<138 copies/mL). A negative result must be combined with clinical observations, patient history, and epidemiological information. The expected result is Negative.  Fact Sheet for Patients:  EntrepreneurPulse.com.au  Fact Sheet for Healthcare Providers:  IncredibleEmployment.be  This test is no t yet approved or cleared by the [place] FDA and  has been authorized for detection and/or diagnosis of SARS-CoV-2 by FDA under an Emergency Use Authorization (EUA). This EUA will remain  in effect (meaning this test can be used) for the duration of the COVID-19  declaration under Section 564(b)(1) of the Act, 21 U.S.C.section 360bbb-3(b)(1), unless the authorization is terminated  or revoked sooner.       Influenza A by PCR NEGATIVE NEGATIVE Final   Influenza B by PCR NEGATIVE NEGATIVE Final    Comment: (NOTE) The Xpert Xpress SARS-CoV-2/FLU/RSV plus assay is intended as an aid in the diagnosis of influenza from Nasopharyngeal swab specimens and should not be used as a sole basis for treatment. Nasal washings and aspirates are unacceptable for Xpert Xpress SARS-CoV-2/FLU/RSV testing.  Fact Sheet for Patients: EntrepreneurPulse.com.au  Fact Sheet for Healthcare Providers: IncredibleEmployment.be  This test is not yet approved or cleared by the [place] FDA and has been authorized for detection and/or diagnosis of SARS-CoV-2 by FDA under an Emergency Use Authorization (EUA). This EUA will remain in effect (meaning this test can be used) for the duration of the COVID-19 declaration under Section 564(b)(1) of the Act, 21 U.S.C. section 360bbb-3(b)(1), unless the authorization is terminated or revoked.  Performed at [facility], [address] [place], [place] [address]   Culture, blood (Routine X 2) w Reflex to ID Panel     Status: Abnormal   Collection Time: 09/14/21 11:15 PM   Specimen: BLOOD  Result Value Ref Range Status   Specimen Description   Final    BLOOD RIGHT ANTECUBITAL Performed at [facility], [address] [place], [place] [address]    Special Requests   Final    BOTTLES DRAWN AEROBIC AND ANAEROBIC Blood Culture results may not be optimal due to an excessive volume of blood received in culture bottles Performed at [facility], [address], [place], [place] [address]    Culture  Setup Time   Final    GRAM POSITIVE COCCI IN BOTH AEROBIC AND ANAEROBIC BOTTLES CRITICAL VALUE NOTED.  VALUE IS CONSISTENT WITH PREVIOUSLY REPORTED AND CALLED  VALUE. Performed at [facility], [address] [place], [place] [address]    Culture (A)  Final    STREPTOCOCCUS AGALACTIAE SUSCEPTIBILITIES PERFORMED ON PREVIOUS CULTURE WITHIN THE LAST 5 DAYS. STAPHYLOCOCCUS CAPITIS THE SIGNIFICANCE OF ISOLATING THIS ORGANISM FROM A SINGLE SET OF BLOOD CULTURES WHEN MULTIPLE SETS ARE DRAWN IS UNCERTAIN. PLEASE NOTIFY THE MICROBIOLOGY DEPARTMENT WITHIN ONE WEEK IF SPECIATION AND SENSITIVITIES ARE REQUIRED. Performed at [facility], [place] [address] [place], [place] [address]    Report Status 09/18/2021 FINAL  Final  Resp Panel by RT-PCR (Flu A&B, Covid) Nasopharyngeal Swab     Status: None   Collection Time: 09/14/21 11:15 PM   Specimen: Nasopharyngeal Swab; Nasopharyngeal(NP) swabs in vial transport medium  Result Value Ref Range Status   SARS Coronavirus 2 by RT PCR NEGATIVE NEGATIVE Final  Comment: (NOTE) SARS-CoV-2 target nucleic acids are NOT DETECTED.  The SARS-CoV-2 RNA is generally detectable in upper respiratory specimens during the acute phase of infection. The lowest concentration of SARS-CoV-2 viral copies this assay can detect is 138 copies/mL. A negative result does not preclude SARS-Cov-2 infection and should not be used as the sole basis for treatment or other patient management decisions. A negative result may occur with  improper specimen collection/handling, submission of specimen other than nasopharyngeal swab, presence of viral mutation(s) within the areas targeted by this assay, and inadequate number of viral copies(<138 copies/mL). A negative result must be combined with clinical observations, patient history, and epidemiological information. The expected result is Negative.  Fact Sheet for Patients:  EntrepreneurPulse.com.au  Fact Sheet for Healthcare Providers:  IncredibleEmployment.be  This test is no t yet approved or cleared by the [place] FDA and  has  been authorized for detection and/or diagnosis of SARS-CoV-2 by FDA under an Emergency Use Authorization (EUA). This EUA will remain  in effect (meaning this test can be used) for the duration of the COVID-19 declaration under Section 564(b)(1) of the Act, 21 U.S.C.section 360bbb-3(b)(1), unless the authorization is terminated  or revoked sooner.       Influenza A by PCR NEGATIVE NEGATIVE Final   Influenza B by PCR NEGATIVE NEGATIVE Final    Comment: (NOTE) The Xpert Xpress SARS-CoV-2/FLU/RSV plus assay is intended as an aid in the diagnosis of influenza from Nasopharyngeal swab specimens and should not be used as a sole basis for treatment. Nasal washings and aspirates are unacceptable for Xpert Xpress SARS-CoV-2/FLU/RSV testing.  Fact Sheet for Patients: EntrepreneurPulse.com.au  Fact Sheet for Healthcare Providers: IncredibleEmployment.be  This test is not yet approved or cleared by the [place] FDA and has been authorized for detection and/or diagnosis of SARS-CoV-2 by FDA under an Emergency Use Authorization (EUA). This EUA will remain in effect (meaning this test can be used) for the duration of the COVID-19 declaration under Section 564(b)(1) of the Act, 21 U.S.C. section 360bbb-3(b)(1), unless the authorization is terminated or revoked.  Performed at [facility], [address] [place], [place] [address]   Culture, blood (Routine X 2) w Reflex to ID Panel     Status: Abnormal   Collection Time: 09/15/21  2:26 AM   Specimen: BLOOD  Result Value Ref Range Status   Specimen Description   Final    BLOOD BLOOD RIGHT HAND Performed at [facility], [address], [place], [place] [address]    Special Requests   Final    BOTTLES DRAWN AEROBIC AND ANAEROBIC Blood Culture adequate volume Performed at [facility], [address] [place],  [address]    Culture  Setup Time   Final    Organism  ID to follow GRAM POSITIVE COCCI IN BOTH AEROBIC AND ANAEROBIC BOTTLES CRITICAL RESULT CALLED TO, READ BACK BY AND VERIFIED WITHLloyd Huger PHARMD [id number] 09/16/21 HNM Performed at [facility], [address] [place],  [address]    Culture GROUP B STREP(S.AGALACTIAE)ISOLATED (A)  Final   Report Status 09/18/2021 FINAL  Final   Organism ID, Bacteria GROUP B STREP(S.AGALACTIAE)ISOLATED  Final      Susceptibility   Group b strep(s.agalactiae)isolated - MIC*    CLINDAMYCIN >=1 RESISTANT Resistant     AMPICILLIN <=0.25 SENSITIVE Sensitive     ERYTHROMYCIN >=8 RESISTANT Resistant     VANCOMYCIN 0.5 SENSITIVE Sensitive     CEFTRIAXONE <=0.12 SENSITIVE Sensitive  LEVOFLOXACIN 1 SENSITIVE Sensitive     * GROUP B STREP(S.AGALACTIAE)ISOLATED  Blood Culture ID Panel (Reflexed)     Status: Abnormal   Collection Time: 09/15/21  2:26 AM  Result Value Ref Range Status   Enterococcus faecalis NOT DETECTED NOT DETECTED Final   Enterococcus Faecium NOT DETECTED NOT DETECTED Final   Listeria monocytogenes NOT DETECTED NOT DETECTED Final   Staphylococcus species NOT DETECTED NOT DETECTED Final   Staphylococcus aureus (BCID) NOT DETECTED NOT DETECTED Final   Staphylococcus epidermidis NOT DETECTED NOT DETECTED Final   Staphylococcus lugdunensis NOT DETECTED NOT DETECTED Final   Streptococcus species DETECTED (A) NOT DETECTED Final    Comment: CRITICAL RESULT CALLED TO, READ BACK BY AND VERIFIED WITHLloyd Huger PHARMD [id number] 09/16/21 HNM    Streptococcus agalactiae DETECTED (A) NOT DETECTED Final    Comment: CRITICAL RESULT CALLED TO, READ BACK BY AND VERIFIED WITH: NATHAN BELUE PHARMD [id number] 09/16/21 HNM    Streptococcus pneumoniae NOT DETECTED NOT DETECTED Final   Streptococcus pyogenes NOT DETECTED NOT DETECTED Final   A.calcoaceticus-baumannii NOT DETECTED NOT DETECTED Final   Bacteroides fragilis NOT DETECTED NOT DETECTED Final   Enterobacterales NOT DETECTED NOT DETECTED Final    Enterobacter cloacae complex NOT DETECTED NOT DETECTED Final   Escherichia coli NOT DETECTED NOT DETECTED Final   Klebsiella aerogenes NOT DETECTED NOT DETECTED Final   Klebsiella oxytoca NOT DETECTED NOT DETECTED Final   Klebsiella pneumoniae NOT DETECTED NOT DETECTED Final   Proteus species NOT DETECTED NOT DETECTED Final   Salmonella species NOT DETECTED NOT DETECTED Final   Serratia marcescens NOT DETECTED NOT DETECTED Final   Haemophilus influenzae NOT DETECTED NOT DETECTED Final   Neisseria meningitidis NOT DETECTED NOT DETECTED Final   Pseudomonas aeruginosa NOT DETECTED NOT DETECTED Final   Stenotrophomonas maltophilia NOT DETECTED NOT DETECTED Final   Candida albicans NOT DETECTED NOT DETECTED Final   Candida auris NOT DETECTED NOT DETECTED Final   Candida glabrata NOT DETECTED NOT DETECTED Final   Candida krusei NOT DETECTED NOT DETECTED Final   Candida parapsilosis NOT DETECTED NOT DETECTED Final   Candida tropicalis NOT DETECTED NOT DETECTED Final   Cryptococcus neoformans/gattii NOT DETECTED NOT DETECTED Final    Comment: Performed at [facility], [address] [place], [place] [address]  Culture, blood (Routine X 2) w Reflex to ID Panel     Status: None   Collection Time: 09/17/21  5:05 AM   Specimen: BLOOD  Result Value Ref Range Status   Specimen Description BLOOD BLOOD RIGHT HAND  Final   Special Requests   Final    IN PEDIATRIC BOTTLE Blood Culture results may not be optimal due to an excessive volume of blood received in culture bottles   Culture   Final    NO GROWTH 5 DAYS Performed at [facility], [address] [place], [place] [address]    Report Status 09/22/2021 FINAL  Final  Aerobic/Anaerobic Culture w Gram Stain (surgical/deep wound)     Status: None (Preliminary result)   Collection Time: 09/19/21 10:17 AM   Specimen: Abscess; Synovial Fluid  Result Value Ref Range Status   Specimen Description   Final    ABSCESS Performed  at [facility], [address] [place], [place] [address]    Special Requests   Final    NONE Performed at [facility], [address], [place], [place] [address]    Gram Stain   Final    FEW WBC  PRESENT,BOTH PMN AND MONONUCLEAR NO ORGANISMS SEEN    Culture   Final    NO GROWTH 4 DAYS NO ANAEROBES ISOLATED; CULTURE IN PROGRESS FOR 5 DAYS Performed at [facility], [place] [address] [place], [place] [address]    Report Status PENDING  Incomplete         Radiology Studies: No results found.      Scheduled Meds:  amiodarone  200 mg Oral BID   apixaban  5 mg Oral BID   atorvastatin  40 mg Oral QPM   brinzolamide  1 drop Both Eyes TID   And   brimonidine  1 drop Both Eyes TID   Chlorhexidine Gluconate Cloth  6 each Topical Daily   escitalopram  5 mg Oral Daily   fluticasone  2 spray Each Nare Daily   insulin aspart  0-15 Units Subcutaneous TID AC & HS   latanoprost  1 drop Both Eyes QHS   levothyroxine  25 mcg Oral Q0600   metoprolol succinate  25 mg Oral BID   mirabegron ER  50 mg Oral Daily   sodium chloride flush  10-40 mL Intracatheter Q12H   Continuous Infusions:  sodium chloride Stopped (09/18/21 0603)   cefTRIAXone (ROCEPHIN)  IV 2 g (09/23/21 0817)     LOS: 8 days    Time spent: 25 minutes    Sidney Ace, MD [facility]   If 7PM-7AM, please contact night-coverage  09/23/2021, 2:00 PM

## 2021-09-24 LAB — AEROBIC/ANAEROBIC CULTURE W GRAM STAIN (SURGICAL/DEEP WOUND): Culture: NO GROWTH

## 2021-09-24 LAB — GLUCOSE, CAPILLARY
Glucose-Capillary: 95 mg/dL (ref 70–99)
Glucose-Capillary: 104 mg/dL — ABNORMAL HIGH (ref 70–99)

## 2021-09-24 MED ORDER — CEFTRIAXONE IV (FOR PTA / DISCHARGE USE ONLY): 2.0000 g | INTRAVENOUS | 0 refills | Status: AC

## 2021-09-24 MED ORDER — AMOXICILLIN 500 MG PO CAPS: 500.0000 mg | ORAL_CAPSULE | Freq: Three times a day (TID) | ORAL | 0 refills | Status: DC

## 2021-09-24 MED ORDER — OXYCODONE HCL 10 MG PO TABS: 10.0000 mg | ORAL_TABLET | Freq: Four times a day (QID) | ORAL | 0 refills | Status: DC | PRN

## 2021-09-24 NOTE — Discharge Summary (Addendum)
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  SECONDARY DIAGNOSIS:   Past Medical History:  Diagnosis Date   Cervical spondylosis    Chronic kidney disease    Stage 3   DDD (degenerative disc disease), cervical    [facility]   Diabetes mellitus without complication (HCC)    Diverticulosis    Hyperlipidemia    Hypertension    Intertrigo    Leg cramps    Leg varices    Obesity    OSA (obstructive sleep apnea)    Symptomatic menopausal or female climacteric states    Syncope     HOSPITAL COURSE:   76 yo female with the past medical history of paroxysmal atrial fibrillation, HTN, dyslipidemia, HTN, SVT, pulmonary HTN, CKD stage 3b, T2DM, chronic DVT and obesity who presented with fever. Reported 3 days of recurrent fever, associated with malaise and weakness. Positive right hip pain.   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 Blood cultures positive for streptococcus group B, and placed on antibiotic therapy.    01/05 TEE with no vegetations, preserved LV systolic function.  01/05 CT guided aspiration right hip, culture with no growth. Gram stain with no organisms.  Plan to continue IV ceftriaxone for a total of 6 weeks  Recurrent group B streptococcus bacteremia, right hip infection.  --TEE negative for vegetation --Right hip with 4000 wbc --Cultures continue with no growth from joint aspirate   --Plan for 6 weeks of ceftriaxone IV for antibiotic therapy, per PICC line.End date 10/26/2021. Patient will then be started on PO amoxicillin 500 mg BID for chronic suppressive therapy indefinitely. --Continue pain control  with oxycodone.  -- Will follow-up with Dr. Steva Ready infectious disease on 26th of January. --repeat BC neg --Right hip joint aspirate--no growth so far   AKI on CKD sage 3b Acute kidney injury has resolved, renal function has been stable. Patient is tolerating po well.    Atrial fibrillation (paroxysmal)l.  --Continue rate control atrial fibrillation with metoprolol and amiodarone.   --Anticoagulation with apixaban, hold on aspirin due to risk of bleeding.     HTN --Blood pressure has been stable.    T2DM with dyslipidemia --Continue with insulin sliding scale, for glucose cover and monitoring.  --Continue with atorvastatin.    Obesity class 3, bilateral lower extremity lymphedema.  Calculated BMI is 41,8   Anemia of chronic disease. hgb has been stable.    Depression.  --on escitalopram.     Hypothyroid --on levothyroxine      DVT prophylaxis: Eliquis Code Status: Full Family Communication: our patient family is aware of her discharging to rehab Disposition Plan: Status is: Inpatient   overall hemodynamically stable for discharge. Patient is in agreement with plan. CONSULTS OBTAINED:    DRUG ALLERGIES:   Allergies  Allergen Reactions   Lisinopril Swelling    DISCHARGE MEDICATIONS:   Allergies as of 09/24/2021       Reactions   Lisinopril Swelling        Medication List     TAKE these medications    Accu-Chek Guide Me w/Device Kit 1 Device by Does not apply route once for  1 dose.   acetaminophen 650 MG CR tablet Commonly known as: TYLENOL Take 650-1,300 mg by mouth 2 (two) times daily as needed for pain.   amiodarone 200 MG tablet Commonly known as: PACERONE Take 200 mg by mouth 2 (two) times daily.   apixaban 5 MG Tabs tablet Commonly known as: ELIQUIS Take 1 tablet by mouth 2 (two) times daily.   Aspirin Low Dose 81 MG EC tablet Generic drug: aspirin TAKE 1 TABLET BY MOUTH EVERY DAY   atorvastatin 40 MG tablet Commonly known as:  LIPITOR Take 1 tablet (40 mg total) by mouth every evening.   benzonatate 100 MG capsule Commonly known as: TESSALON Take 1 capsule (100 mg total) by mouth 3 (three) times daily as needed.   Calcium Carbonate-Vitamin D 600-400 MG-UNIT tablet Take 1 tablet by mouth 2 (two) times daily.   cefTRIAXone  IVPB Commonly known as: ROCEPHIN Inject 2 g into the vein daily. Indication:  Recurrent Group B Streptococcal bacteremia / septic arthritis of right hip / extensive hardware Last Day of Therapy: 10/26/2021 Labs - Once weekly (Draw on Mon or Tues):  CBC/D and CMP and ESR   clonazePAM 0.5 MG tablet Commonly known as: KLONOPIN Take 1 tablet (0.5 mg total) by mouth 2 (two) times daily as needed for anxiety. Panic attack only   escitalopram 5 MG tablet Commonly known as: LEXAPRO TAKE 1 TABLET BY MOUTH DAILY FOR ANXIETY. WAIT UNTIL FOLLOW UP WITH DR. CALLWOOD TO START THIS MED What changed: See the new instructions.   fluticasone 50 MCG/ACT nasal spray Commonly known as: FLONASE SPRAY 2 SPRAYS INTO EACH NOSTRIL EVERY DAY   latanoprost 0.005 % ophthalmic solution Commonly known as: XALATAN SMARTSIG:In Eye(s)   levothyroxine 25 MCG tablet Commonly known as: SYNTHROID TAKE 1 TABLET (25 MCG TOTAL) BY MOUTH DAILY AT 6 (SIX) AM.   Magnesium Oxide 500 MG Caps Take 500 mg by mouth daily.   metoprolol succinate 50 MG 24 hr tablet Commonly known as: TOPROL-XL Take 50 mg by mouth daily.   mirabegron ER 50 MG Tb24 tablet Commonly known as: Myrbetriq Take 1 tablet (50 mg total) by mouth daily.   Oxycodone HCl 10 MG Tabs Take 1 tablet (10 mg total) by mouth every 6 (six) hours as needed.   polyethylene glycol 17 g packet Commonly known as: MIRALAX / GLYCOLAX Take 17 g by mouth daily.   potassium chloride 10 MEQ tablet Commonly known as: KLOR-CON Take 10 mEq by mouth daily.   Simbrinza 1-0.2 % Susp Generic drug: Brinzolamide-Brimonidine Place 1 drop into both eyes 2 (two) times  daily.   sodium chloride 0.65 % Soln nasal spray Commonly known as: OCEAN Place 1 spray into both nostrils 3 (three) times daily.   torsemide 20 MG tablet Commonly known as: DEMADEX TAKE 1 TABLET BY MOUTH EVERY DAY   traZODone 50 MG tablet Commonly known as: DESYREL TAKE 1/2 TO 1 TABLET BY MOUTH AT BEDTIME AS NEEDED FOR SLEEP.        If you experience worsening of your admission symptoms, develop shortness of breath, life threatening emergency, suicidal or homicidal thoughts you must seek medical attention immediately by calling 911 or calling your MD immediately  if symptoms less severe.  You Must read complete instructions/literature along with all the possible adverse reactions/side effects for all the Medicines you take and that have been prescribed to you. Take any new Medicines after you have completely understood and accept all the possible adverse reactions/side effects.  Please note  You were cared for by a hospitalist during your hospital stay. If you have any questions about your discharge medications or the care you received while you were in the hospital after you are discharged, you can call the unit and asked to speak with the hospitalist on call if the hospitalist that took care of you is not available. Once you are discharged, your primary care physician will handle any further medical issues. Please note that NO REFILLS for any discharge medications will be authorized once you are discharged, as it is imperative that you return to your primary care physician (or establish a relationship with a primary care physician if you do not have one) for your aftercare needs so that they can reassess your need for medications and monitor your lab values. Today   SUBJECTIVE  no new complaints   VITAL SIGNS:  Blood pressure (!) 111/57, pulse 83, temperature 98 F (36.7 C), resp. rate 16, height $RemoveBe'5\' 11"'TBllAlCIi$  (1.803 m), weight 136.1 kg, SpO2 100 %.  I/O:   Intake/Output Summary  (Last 24 hours) at 09/24/2021 1119 Last data filed at 09/23/2021 2045 Gross per 24 hour  Intake 240 ml  Output 500 ml  Net -260 ml    PHYSICAL EXAMINATION:  GENERAL:  76 y.o.-year-old patient lying in the bed with no acute distress.  LUNGS: Normal breath sounds bilaterally, no wheezing, rales,rhonchi or crepitation. No use of accessory muscles of respiration.  CARDIOVASCULAR: S1, S2 normal. No murmurs, rubs, or gallops.  ABDOMEN: Soft, non-tender, non-distended. Bowel sounds present. No organomegaly or mass.  EXTREMITIES: chronic bilateral lower extremity edema. PICC line + NEUROLOGIC: non-focal PSYCHIATRIC:  patient is alert and oriented x 3. SKIN: No obvious rash, lesion, or ulcer.   DATA REVIEW:   CBC  Recent Labs  Lab 09/23/21 0802  WBC 10.5  HGB 8.2*  HCT 25.5*  PLT 313    Chemistries  Recent Labs  Lab 09/23/21 0802  NA 131*  K 3.5  CL 98  CO2 26  GLUCOSE 90  BUN 18  CREATININE 0.89  CALCIUM 7.8*    Microbiology Results   Recent Results (from the past 240 hour(s))  Resp Panel by RT-PCR (Flu A&B, Covid) Nasopharyngeal Swab     Status: None   Collection Time: 09/14/21  4:57 PM   Specimen: Nasopharyngeal Swab; Nasopharyngeal(NP) swabs in vial transport medium  Result Value Ref Range Status   SARS Coronavirus 2 by RT PCR NEGATIVE NEGATIVE Final    Comment: (NOTE) SARS-CoV-2 target nucleic acids are NOT DETECTED.  The SARS-CoV-2 RNA is generally detectable in upper respiratory specimens during the acute phase of infection. The lowest concentration of SARS-CoV-2 viral copies this assay can detect is 138 copies/mL. A negative result does not preclude SARS-Cov-2 infection and should not be used as the sole basis for treatment or other patient management decisions. A negative result may occur with  improper specimen collection/handling, submission of specimen other than nasopharyngeal swab, presence of viral mutation(s) within the areas targeted by this assay,  and inadequate number of viral copies(<138 copies/mL). A negative result must be combined with clinical observations, patient history, and epidemiological information. The expected result is Negative.  Fact Sheet for Patients:  EntrepreneurPulse.com.au  Fact Sheet for Healthcare Providers:  IncredibleEmployment.be  This test is no t yet approved or cleared by the [place] FDA and  has been authorized for detection and/or diagnosis of SARS-CoV-2 by FDA under an Emergency Use Authorization (EUA). This EUA will remain  in effect (meaning this test can be used) for the duration of the COVID-19 declaration under Section 564(b)(1) of the Act, 21 U.S.C.section 360bbb-3(b)(1), unless the authorization is terminated  or revoked sooner.       Influenza A by PCR NEGATIVE NEGATIVE Final   Influenza B by PCR NEGATIVE NEGATIVE Final    Comment: (NOTE) The Xpert Xpress SARS-CoV-2/FLU/RSV plus assay is intended as an aid in the diagnosis of influenza from Nasopharyngeal swab specimens and should not be used as a sole basis for treatment. Nasal washings and aspirates are unacceptable for Xpert Xpress SARS-CoV-2/FLU/RSV testing.  Fact Sheet for Patients: EntrepreneurPulse.com.au  Fact Sheet for Healthcare Providers: IncredibleEmployment.be  This test is not yet approved or cleared by the [place] FDA and has been authorized for detection and/or diagnosis of SARS-CoV-2 by FDA under an Emergency Use Authorization (EUA). This EUA will remain in effect (meaning this test can be used) for the duration of the COVID-19 declaration under Section 564(b)(1) of the Act, 21 U.S.C. section 360bbb-3(b)(1), unless the authorization is terminated or revoked.  Performed at [facility], [address] [place], [place] [address]   Culture, blood (Routine X 2) w Reflex to ID Panel     Status: Abnormal   Collection  Time: 09/14/21 11:15 PM   Specimen: BLOOD  Result Value Ref Range Status   Specimen Description   Final    BLOOD RIGHT ANTECUBITAL Performed at [facility], [address] [place], [place] [address]    Special Requests   Final    BOTTLES DRAWN AEROBIC AND ANAEROBIC Blood Culture results may not be optimal due to an excessive volume of blood received in culture bottles Performed at [facility], [address], [place], [place] [address]    Culture  Setup Time   Final    GRAM POSITIVE COCCI IN BOTH AEROBIC AND ANAEROBIC BOTTLES CRITICAL VALUE NOTED.  VALUE IS CONSISTENT WITH PREVIOUSLY REPORTED AND CALLED VALUE. Performed at [facility], [address] [place], [place] [address]    Culture (A)  Final    STREPTOCOCCUS AGALACTIAE SUSCEPTIBILITIES PERFORMED ON PREVIOUS CULTURE WITHIN THE LAST 5 DAYS. STAPHYLOCOCCUS CAPITIS THE SIGNIFICANCE OF ISOLATING THIS ORGANISM FROM A SINGLE SET OF BLOOD CULTURES WHEN MULTIPLE SETS ARE DRAWN IS UNCERTAIN. PLEASE NOTIFY THE MICROBIOLOGY DEPARTMENT WITHIN ONE WEEK IF SPECIATION AND SENSITIVITIES ARE REQUIRED. Performed at [facility], [place] [address], [place], [place] [address]    Report Status 09/18/2021 FINAL  Final  Resp Panel by RT-PCR (Flu A&B, Covid) Nasopharyngeal Swab     Status: None   Collection Time: 09/14/21 11:15 PM   Specimen: Nasopharyngeal Swab; Nasopharyngeal(NP) swabs in vial transport medium  Result Value Ref Range Status   SARS Coronavirus 2 by RT PCR NEGATIVE NEGATIVE Final    Comment: (NOTE) SARS-CoV-2 target nucleic acids are NOT DETECTED.  The SARS-CoV-2 RNA is generally detectable in upper respiratory specimens during the acute phase of infection. The lowest concentration of SARS-CoV-2 viral copies this assay can detect is 138 copies/mL. A negative result does not preclude SARS-Cov-2 infection and should not be used as the sole basis for treatment or other patient management  decisions. A negative result may occur with  improper specimen collection/handling, submission of specimen other than nasopharyngeal swab, presence of viral mutation(s) within the areas targeted by this assay, and inadequate number of viral copies(<138 copies/mL). A negative result must be combined with clinical observations, patient history, and epidemiological information. The expected  result is Negative.  Fact Sheet for Patients:  EntrepreneurPulse.com.au  Fact Sheet for Healthcare Providers:  IncredibleEmployment.be  This test is no t yet approved or cleared by the [place] FDA and  has been authorized for detection and/or diagnosis of SARS-CoV-2 by FDA under an Emergency Use Authorization (EUA). This EUA will remain  in effect (meaning this test can be used) for the duration of the COVID-19 declaration under Section 564(b)(1) of the Act, 21 U.S.C.section 360bbb-3(b)(1), unless the authorization is terminated  or revoked sooner.       Influenza A by PCR NEGATIVE NEGATIVE Final   Influenza B by PCR NEGATIVE NEGATIVE Final    Comment: (NOTE) The Xpert Xpress SARS-CoV-2/FLU/RSV plus assay is intended as an aid in the diagnosis of influenza from Nasopharyngeal swab specimens and should not be used as a sole basis for treatment. Nasal washings and aspirates are unacceptable for Xpert Xpress SARS-CoV-2/FLU/RSV testing.  Fact Sheet for Patients: EntrepreneurPulse.com.au  Fact Sheet for Healthcare Providers: IncredibleEmployment.be  This test is not yet approved or cleared by the [place] FDA and has been authorized for detection and/or diagnosis of SARS-CoV-2 by FDA under an Emergency Use Authorization (EUA). This EUA will remain in effect (meaning this test can be used) for the duration of the COVID-19 declaration under Section 564(b)(1) of the Act, 21 U.S.C. section 360bbb-3(b)(1), unless the  authorization is terminated or revoked.  Performed at [facility], [address] [place], [place] [address]   Culture, blood (Routine X 2) w Reflex to ID Panel     Status: Abnormal   Collection Time: 09/15/21  2:26 AM   Specimen: BLOOD  Result Value Ref Range Status   Specimen Description   Final    BLOOD BLOOD RIGHT HAND Performed at [facility], [address] [place], [place] [address]    Special Requests   Final    BOTTLES DRAWN AEROBIC AND ANAEROBIC Blood Culture adequate volume Performed at [facility], [address] [place], [place] [address]    Culture  Setup Time   Final    Organism ID to follow GRAM POSITIVE COCCI IN BOTH AEROBIC AND ANAEROBIC BOTTLES CRITICAL RESULT CALLED TO, READ BACK BY AND VERIFIED WITHLloyd Huger [id number] 09/16/21 HNM Performed at [facility], [address] [place], [place] [address]    Culture GROUP B STREP(S.AGALACTIAE)ISOLATED (A)  Final   Report Status 09/18/2021 FINAL  Final   Organism ID, Bacteria GROUP B STREP(S.AGALACTIAE)ISOLATED  Final      Susceptibility   Group b strep(s.agalactiae)isolated - MIC*    CLINDAMYCIN >=1 RESISTANT Resistant     AMPICILLIN <=0.25 SENSITIVE Sensitive     ERYTHROMYCIN >=8 RESISTANT Resistant     VANCOMYCIN 0.5 SENSITIVE Sensitive     CEFTRIAXONE <=0.12 SENSITIVE Sensitive     LEVOFLOXACIN 1 SENSITIVE Sensitive     * GROUP B STREP(S.AGALACTIAE)ISOLATED  Blood Culture ID Panel (Reflexed)     Status: Abnormal   Collection Time: 09/15/21  2:26 AM  Result Value Ref Range Status   Enterococcus faecalis NOT DETECTED NOT DETECTED Final   Enterococcus Faecium NOT DETECTED NOT DETECTED Final   Listeria monocytogenes NOT DETECTED NOT DETECTED Final   Staphylococcus species NOT DETECTED NOT DETECTED Final   Staphylococcus aureus (BCID) NOT DETECTED NOT DETECTED Final   Staphylococcus epidermidis NOT DETECTED NOT DETECTED Final   Staphylococcus lugdunensis NOT  DETECTED NOT DETECTED Final   Streptococcus species DETECTED (A) NOT DETECTED Final  Comment: CRITICAL RESULT CALLED TO, READ BACK BY AND VERIFIED WITH: Lloyd Huger PHARMD [id number] 09/16/21 HNM    Streptococcus agalactiae DETECTED (A) NOT DETECTED Final    Comment: CRITICAL RESULT CALLED TO, READ BACK BY AND VERIFIED WITH: Lloyd Huger PHARMD [id number] 09/16/21 HNM    Streptococcus pneumoniae NOT DETECTED NOT DETECTED Final   Streptococcus pyogenes NOT DETECTED NOT DETECTED Final   A.calcoaceticus-baumannii NOT DETECTED NOT DETECTED Final   Bacteroides fragilis NOT DETECTED NOT DETECTED Final   Enterobacterales NOT DETECTED NOT DETECTED Final   Enterobacter cloacae complex NOT DETECTED NOT DETECTED Final   Escherichia coli NOT DETECTED NOT DETECTED Final   Klebsiella aerogenes NOT DETECTED NOT DETECTED Final   Klebsiella oxytoca NOT DETECTED NOT DETECTED Final   Klebsiella pneumoniae NOT DETECTED NOT DETECTED Final   Proteus species NOT DETECTED NOT DETECTED Final   Salmonella species NOT DETECTED NOT DETECTED Final   Serratia marcescens NOT DETECTED NOT DETECTED Final   Haemophilus influenzae NOT DETECTED NOT DETECTED Final   Neisseria meningitidis NOT DETECTED NOT DETECTED Final   Pseudomonas aeruginosa NOT DETECTED NOT DETECTED Final   Stenotrophomonas maltophilia NOT DETECTED NOT DETECTED Final   Candida albicans NOT DETECTED NOT DETECTED Final   Candida auris NOT DETECTED NOT DETECTED Final   Candida glabrata NOT DETECTED NOT DETECTED Final   Candida krusei NOT DETECTED NOT DETECTED Final   Candida parapsilosis NOT DETECTED NOT DETECTED Final   Candida tropicalis NOT DETECTED NOT DETECTED Final   Cryptococcus neoformans/gattii NOT DETECTED NOT DETECTED Final    Comment: Performed at [facility], [address] [place], [place] [address]  Culture, blood (Routine X 2) w Reflex to ID Panel     Status: None   Collection Time: 09/17/21  5:05 AM   Specimen: BLOOD  Result Value  Ref Range Status   Specimen Description BLOOD BLOOD RIGHT HAND  Final   Special Requests   Final    IN PEDIATRIC BOTTLE Blood Culture results may not be optimal due to an excessive volume of blood received in culture bottles   Culture   Final    NO GROWTH 5 DAYS Performed at [facility], [address], [place], [place] [address]    Report Status 09/22/2021 FINAL  Final  Aerobic/Anaerobic Culture w Gram Stain (surgical/deep wound)     Status: None (Preliminary result)   Collection Time: 09/19/21 10:17 AM   Specimen: Abscess; Synovial Fluid  Result Value Ref Range Status   Specimen Description   Final    ABSCESS Performed at [facility], [address], [place], [place] [address]    Special Requests   Final    NONE Performed at [facility], [address] [place], [place] [address]    Gram Stain   Final    FEW WBC PRESENT,BOTH PMN AND MONONUCLEAR NO ORGANISMS SEEN    Culture   Final    NO GROWTH 4 DAYS NO ANAEROBES ISOLATED; CULTURE IN PROGRESS FOR 5 DAYS Performed at [facility], [address] [place],  [address]    Report Status PENDING  Incomplete  Resp Panel by RT-PCR (Flu A&B, Covid) Nasopharyngeal Swab     Status: None   Collection Time: 09/23/21  3:40 PM   Specimen: Nasopharyngeal Swab; Nasopharyngeal(NP) swabs in vial transport medium  Result Value Ref Range Status   SARS Coronavirus 2 by RT PCR NEGATIVE NEGATIVE Final    Comment: (NOTE) SARS-CoV-2 target nucleic acids are NOT DETECTED.  The SARS-CoV-2 RNA is generally detectable in upper respiratory specimens during the acute phase of infection. The lowest concentration of SARS-CoV-2 viral copies this assay can detect is 138 copies/mL. A negative result does not preclude SARS-Cov-2 infection and should not be used as the sole basis for treatment or other patient management decisions. A negative result may occur with  improper specimen collection/handling, submission  of specimen other than nasopharyngeal swab, presence of viral mutation(s) within the areas targeted by this assay, and inadequate number of viral copies(<138 copies/mL). A negative result must be combined with clinical observations, patient history, and epidemiological information. The expected result is Negative.  Fact Sheet for Patients:  EntrepreneurPulse.com.au  Fact Sheet for Healthcare Providers:  IncredibleEmployment.be  This test is no t yet approved or cleared by the [place] FDA and  has been authorized for detection and/or diagnosis of SARS-CoV-2 by FDA under an Emergency Use Authorization (EUA). This EUA will remain  in effect (meaning this test can be used) for the duration of the COVID-19 declaration under Section 564(b)(1) of the Act, 21 U.S.C.section 360bbb-3(b)(1), unless the authorization is terminated  or revoked sooner.       Influenza A by PCR NEGATIVE NEGATIVE Final   Influenza B by PCR NEGATIVE NEGATIVE Final    Comment: (NOTE) The Xpert Xpress SARS-CoV-2/FLU/RSV plus assay is intended as an aid in the diagnosis of influenza from Nasopharyngeal swab specimens and should not be used as a sole basis for treatment. Nasal washings and aspirates are unacceptable for Xpert Xpress SARS-CoV-2/FLU/RSV testing.  Fact Sheet for Patients: EntrepreneurPulse.com.au  Fact Sheet for Healthcare Providers: IncredibleEmployment.be  This test is not yet approved or cleared by the [place] FDA and has been authorized for detection and/or diagnosis of SARS-CoV-2 by FDA under an Emergency Use Authorization (EUA). This EUA will remain in effect (meaning this test can be used) for the duration of the COVID-19 declaration under Section 564(b)(1) of the Act, 21 U.S.C. section 360bbb-3(b)(1), unless the authorization is terminated or revoked.  Performed at [facility], [address], [place], [place] [address]     RADIOLOGY:  No results found.   CODE STATUS:     Code Status Orders  (From admission, onward)           Start     Ordered   09/15/21 0150  Full code  Continuous        09/15/21 0152           Code Status History     Date Active Date Inactive Code Status Order ID Comments User Context   07/16/2021 0208 07/20/2021 1752 Full Code [id number]  Lang Snow, NP ED   09/02/2020 2025 09/14/2020 1846 Full Code [id number]  Reubin Milan, MD ED   10/28/2019 0506 11/07/2019 2326 Full Code [id number]  Mansy, Arvella Merles, MD Inpatient   08/07/2019 1604 08/09/2019 1856 Full Code [id number]  Para Skeans, MD ED   08/07/2019 1316 08/07/2019 1604 Full Code [id number]  Para Skeans, MD ED   11/19/2017 1141 11/22/2017 1429 Full Code [id number]  Hessie Knows, MD Inpatient   03/11/2016 1143 03/14/2016 2107 Full Code [id number]  Hessie Knows, MD Inpatient        TOTAL TIME TAKING CARE OF THIS PATIENT: 40 minutes.    Fritzi Mandes M.D  [facility]    CC: Primary care physician; [name], MD

## 2021-09-24 NOTE — Progress Notes (Signed)
VSS, PICC line in REU intact, Pt transported via EMS by stretcher with all belongings. Attempted to call report to [facility] unsuccessful each time.

## 2021-09-24 NOTE — TOC Progression Note (Signed)
Transition of Care [facility]) - Progression Note    Patient Details  Name: Becky Trevino MRN: [id number] Date of Birth: 04/24/1946  Transition of Care [facility]) CM/SW [place], RN Phone Number: 09/24/2021, 12:28 PM  Clinical Narrative:   Informed the patient of ins approval and that she will go to room 225A, she stated that she will notify her family, The bedside nurse is to call report EMS has been called and she is 2nd on the list         Expected Discharge Plan and Services           Expected Discharge Date: 09/24/21                                     Social Determinants of Health (SDOH) Interventions    Readmission Risk Interventions No flowsheet data found.

## 2021-09-24 NOTE — Progress Notes (Signed)
Occupational Therapy Treatment Patient Details Name: Becky Trevino MRN: [id number] DOB: [birth year] Today's Date: 09/24/2021   History of present illness 76 year old female presenting from home with past medical history of paroxysmal atrial fibrillation (on Eliquis), hyperlipidemia, hypertension, history of SVT, pulmonary hypertension, chronic kidney disease stage IIIB, non-insulin-dependent diabetes mellitus type 2, obesity, obstructive sleep apnea, chronic DVT of the right lower extremity status post IVC filter placement, lymphedema, hypothyroidism who presents to [facility] emergency department with complaints of fevers and weakness.  Presents with 3 days of fever, malaise noted to have left leg cellulitis improving with IV antibiotics as below.   OT comments  Becky Trevino received pain medication prior to session, endorsed 8/10 pain at beginning of session and 7/10 by end. Requires Mod A for supine<sit, Max A sit<supine, with pt unable to elevate LE into bed, and Max A for scooting in bed. Multiple attempts at sit to stand from EOB, with bed in raised position, but pt is unable to clear buttocks off mattress. Performed UE therex in sitting. Pt rolls in bed with Min A. DC to SNF remains appropriate, given pt's limited fxl mobility.    Recommendations for follow up therapy are one component of a multi-disciplinary discharge planning process, led by the attending physician.  Recommendations may be updated based on patient status, additional functional criteria and insurance authorization.    Follow Up Recommendations  Skilled nursing-short term rehab (<3 hours/day)    Assistance Recommended at Discharge    Patient can return home with the following  Two people to help with walking and/or transfers;Two people to help with bathing/dressing/bathroom   Equipment Recommendations  None recommended by OT    Recommendations for Other Services      Precautions / Restrictions Precautions Precautions:  Fall Restrictions Weight Bearing Restrictions: No       Mobility Bed Mobility Overal bed mobility: Needs Assistance Bed Mobility: Supine to Sit;Sit to Supine     Supine to sit: Mod assist Sit to supine: Max assist   General bed mobility comments: MaxA for LEs to return to bed; maxA for scooting up in bed.    Transfers Overall transfer level: Needs assistance Equipment used: Rolling walker (2 wheels) Transfers: Sit to/from Stand Sit to Stand: Max assist;From elevated surface           General transfer comment: Unable to clear buttocks off bed     Balance Overall balance assessment: Needs assistance Sitting-balance support: Feet supported;No upper extremity supported Sitting balance-Leahy Scale: Fair Sitting balance - Comments: able to maintain static sititng with UE support and close supervision   Standing balance support: Bilateral upper extremity supported;Reliant on assistive device for balance Standing balance-Leahy Scale: Zero Standing balance comment: unable to come into standing                           ADL either performed or assessed with clinical judgement   ADL Overall ADL's : Needs assistance/impaired                                       General ADL Comments: Min A for grooming EOB in sitting    Extremity/Trunk Assessment Upper Extremity Assessment Upper Extremity Assessment: RUE deficits/detail;LUE deficits/detail RUE Deficits / Details: Limited shoulder elevation < 60 degrees RUE Sensation: WNL RUE Coordination: WNL LUE Deficits / Details: Limited shoulder elevation < 60 degrees  LUE Sensation: WNL LUE Coordination: WNL   Lower Extremity Assessment Lower Extremity Assessment: Generalized weakness        Vision Baseline Vision/History: 1 Wears glasses Ability to See in Adequate Light: 0 Adequate Patient Visual Report: No change from baseline     Perception     Praxis      Cognition Arousal/Alertness:  Awake/alert Behavior During Therapy: WFL for tasks assessed/performed;Flat affect Overall Cognitive Status: Within Functional Limits for tasks assessed                                            Exercises Other Exercises Other Exercises: UE therex, simulated push-ups in sitting, in preparation for STS   Shoulder Instructions       General Comments      Pertinent Vitals/ Pain       Pain Assessment: 0-10 Pain Score: 7  Pain Intervention(s): Limited activity within patient's tolerance;Monitored during session;Repositioned  Home Living                                          Prior Functioning/Environment              Frequency  Min 2X/week        Progress Toward Goals  OT Goals(current goals can now be found in the care plan section)  Progress towards OT goals: Progressing toward goals  Acute Rehab OT Goals Patient Stated Goal: to get stronger OT Goal Formulation: With patient Time For Goal Achievement: 10/05/21 Potential to Achieve Goals: Good  Plan Discharge plan remains appropriate;Frequency remains appropriate    Co-evaluation                 AM-PAC OT "6 Clicks" Daily Activity     Outcome Measure   Help from another person eating meals?: None Help from another person taking care of personal grooming?: A Little Help from another person toileting, which includes using toliet, bedpan, or urinal?: Total Help from another person bathing (including washing, rinsing, drying)?: A Lot Help from another person to put on and taking off regular upper body clothing?: A Little Help from another person to put on and taking off regular lower body clothing?: A Lot 6 Click Score: 15    End of Session Equipment Utilized During Treatment: Gait belt;Rolling walker (2 wheels)  OT Visit Diagnosis: Muscle weakness (generalized) (M62.81);Pain Pain - Right/Left: Right Pain - part of body: Leg   Activity Tolerance Patient limited  by pain   Patient Left in bed;with call bell/phone within reach;with bed alarm set   Nurse Communication          Time: 5638-9373 OT Time Calculation (min): 25 min  Charges: OT General Charges $OT Visit: 1 Visit OT Treatments $Self Care/Home Management : 23-37 mins Josiah Lobo, PhD, MS, OTR/L 09/24/21, 11:35 AM

## 2021-09-24 NOTE — Care Management Important Message (Signed)
Important Message  Patient Details  Name: Becky Trevino MRN: [id number] Date of Birth: 1946/03/07   Medicare Important Message Given:  Yes     Juliann Pulse A Chella Chapdelaine 09/24/2021, 11:46 AM

## 2021-09-24 NOTE — TOC Progression Note (Signed)
Transition of Care [facility]) - Progression Note    Patient Details  Name: Becky Trevino MRN: [id number] Date of Birth: 10-13-1945  Transition of Care [facility]) CM/SW [place], RN Phone Number: 09/24/2021, 10:23 AM  Clinical Narrative:   Insurance approval for this patient to go to [facility], Approval number [id number], I notified Hilda Blades at [facility], patient will DC today         Expected Discharge Plan and Services                                                 Social Determinants of Health (SDOH) Interventions    Readmission Risk Interventions No flowsheet data found.

## 2021-09-24 NOTE — Discharge Instructions (Signed)
PICC line care per protocol. Discontinue PICC line once IV antibiotic is completed

## 2021-09-24 NOTE — Plan of Care (Signed)

## 2021-09-26 ENCOUNTER — Encounter (INDEPENDENT_AMBULATORY_CARE_PROVIDER_SITE_OTHER): Payer: Self-pay | Admitting: Vascular Surgery

## 2021-09-26 ENCOUNTER — Other Ambulatory Visit: Payer: Self-pay

## 2021-09-26 ENCOUNTER — Ambulatory Visit (INDEPENDENT_AMBULATORY_CARE_PROVIDER_SITE_OTHER): Payer: Medicare Other | Admitting: Vascular Surgery

## 2021-09-26 ENCOUNTER — Ambulatory Visit (INDEPENDENT_AMBULATORY_CARE_PROVIDER_SITE_OTHER): Payer: PRIVATE HEALTH INSURANCE

## 2021-09-26 VITALS — BP 91/62 | HR 73 | Resp 16

## 2021-09-26 DIAGNOSIS — I10 Essential (primary) hypertension: Secondary | ICD-10-CM

## 2021-09-26 DIAGNOSIS — I87099 Postthrombotic syndrome with other complications of unspecified lower extremity: Secondary | ICD-10-CM

## 2021-09-26 DIAGNOSIS — I872 Venous insufficiency (chronic) (peripheral): Secondary | ICD-10-CM | POA: Diagnosis not present

## 2021-09-26 DIAGNOSIS — Z86718 Personal history of other venous thrombosis and embolism: Secondary | ICD-10-CM

## 2021-09-26 DIAGNOSIS — N183 Chronic kidney disease, stage 3 unspecified: Secondary | ICD-10-CM

## 2021-09-26 DIAGNOSIS — I890 Lymphedema, not elsewhere classified: Secondary | ICD-10-CM

## 2021-09-26 DIAGNOSIS — I480 Paroxysmal atrial fibrillation: Secondary | ICD-10-CM

## 2021-09-26 DIAGNOSIS — I739 Peripheral vascular disease, unspecified: Secondary | ICD-10-CM

## 2021-09-26 DIAGNOSIS — E1122 Type 2 diabetes mellitus with diabetic chronic kidney disease: Secondary | ICD-10-CM

## 2021-09-26 NOTE — Progress Notes (Signed)
MRN : [id number]  Becky Trevino is a 76 y.o. (1946/08/20) female who presents with chief complaint of check legs.  History of Present Illness:  Patient returns today in follow up of her leg swelling and lymphedema.  She has been doing physical therapy but she has not been walking much for many months.  She has no open wounds and has been wearing the Velcro compression system on the lower legs, but is now developing a large amount of swelling in her medial thighs bilaterally.  The left leg is a little worse than the right leg.  She has a previous history of DVT in the right leg.  Duplex ultrasound of the venous system obtained today demonstrates the IVC is patent as is the, and and external iliac veins bilaterally.  Current Meds  Medication Sig   acetaminophen (TYLENOL) 650 MG CR tablet Take 650-1,300 mg by mouth 2 (two) times daily as needed for pain.   amiodarone (PACERONE) 200 MG tablet Take 200 mg by mouth 2 (two) times daily.   [START ON 10/27/2021] amoxicillin (AMOXIL) 500 MG capsule Take 1 capsule (500 mg total) by mouth 3 (three) times daily. Chronic suppressive therapy to start 10/27/2021 after completion of IV ceftriaxone on 10/26/2021.  To remain on amoxicillin suppressive therapy indefinitely until infectious disease says may stop   apixaban (ELIQUIS) 5 MG TABS tablet Take 1 tablet by mouth 2 (two) times daily.   ASPIRIN LOW DOSE 81 MG EC tablet TAKE 1 TABLET BY MOUTH EVERY DAY   atorvastatin (LIPITOR) 40 MG tablet Take 1 tablet (40 mg total) by mouth every evening.   benzonatate (TESSALON) 100 MG capsule Take 1 capsule (100 mg total) by mouth 3 (three) times daily as needed.   Calcium Carbonate-Vitamin D 600-400 MG-UNIT tablet Take 1 tablet by mouth 2 (two) times daily.   cefTRIAXone (ROCEPHIN) IVPB Inject 2 g into the vein daily. Indication:  Recurrent Group B Streptococcal bacteremia / septic arthritis of right hip / extensive hardware Last Day of Therapy: 10/26/2021 Labs - Once  weekly (Draw on Mon or Tues):  CBC/D and CMP and ESR   clonazePAM (KLONOPIN) 0.5 MG tablet Take 1 tablet (0.5 mg total) by mouth 2 (two) times daily as needed for anxiety. Panic attack only   escitalopram (LEXAPRO) 5 MG tablet TAKE 1 TABLET BY MOUTH DAILY FOR ANXIETY. WAIT UNTIL FOLLOW UP WITH DR. CALLWOOD TO START THIS MED   fluticasone (FLONASE) 50 MCG/ACT nasal spray SPRAY 2 SPRAYS INTO EACH NOSTRIL EVERY DAY   latanoprost (XALATAN) 0.005 % ophthalmic solution SMARTSIG:In Eye(s)   Magnesium Oxide 500 MG CAPS Take 500 mg by mouth daily.    metoprolol succinate (TOPROL-XL) 50 MG 24 hr tablet Take 50 mg by mouth daily.   mirabegron ER (MYRBETRIQ) 50 MG TB24 tablet Take 1 tablet (50 mg total) by mouth daily.   Oxycodone HCl 10 MG TABS Take 1 tablet (10 mg total) by mouth every 6 (six) hours as needed.   polyethylene glycol (MIRALAX / GLYCOLAX) 17 g packet Take 17 g by mouth daily.   potassium chloride (KLOR-CON) 10 MEQ tablet Take 10 mEq by mouth daily.   SIMBRINZA 1-0.2 % SUSP Place 1 drop into both eyes 2 (two) times daily.   sodium chloride (OCEAN) 0.65 % SOLN nasal spray Place 1 spray into both nostrils 3 (three) times daily.   torsemide (DEMADEX) 20 MG tablet TAKE 1 TABLET BY MOUTH EVERY DAY   traZODone (DESYREL) 50 MG tablet  TAKE 1/2 TO 1 TABLET BY MOUTH AT BEDTIME AS NEEDED FOR SLEEP.    Past Medical History:  Diagnosis Date   Cervical spondylosis    Chronic kidney disease    Stage 3   DDD (degenerative disc disease), cervical    [facility]   Diabetes mellitus without complication (HCC)    Diverticulosis    Hyperlipidemia    Hypertension    Intertrigo    Leg cramps    Leg varices    Obesity    OSA (obstructive sleep apnea)    Symptomatic menopausal or female climacteric states    Syncope     Past Surgical History:  Procedure Laterality Date   ABDOMINAL HYSTERECTOMY  1993   Total   BUNIONECTOMY Bilateral 1993   I & D KNEE WITH POLY EXCHANGE Right 11/19/2017    Procedure: RIGHT KNEE POLY EXCHANGE WITH IRRIGATION AND DEBRIDEMENT;  Surgeon: Hessie Knows, MD;  Location: [facility];  Service: Orthopedics;  Laterality: Right;   IR FLUORO GUIDED NEEDLE PLC ASPIRATION/INJECTION LOC  09/19/2021   JOINT REPLACEMENT     bilateral knee   LAMINECTOMY  11/14/2013    Cervical Fusion , [facility], Dr. Delilah Shan   LASER ABLATION Bilateral 07/29/2012   Dr. Lucky Cowboy   LOWER EXTREMITY ANGIOGRAPHY Right 11/02/2019   Procedure: Lower Extremity Angiography;  Surgeon: Katha Cabal, MD;  Location: [place] [facility];  Service: Cardiovascular;  Laterality: Right;   ORIF ANKLE FRACTURE Right 09/02/2019   Procedure: OPEN REDUCTION INTERNAL FIXATION (ORIF) ANKLE FRACTURE BIMALLEOLAR;  Surgeon: [name], DPM;  Location: [facility];  Service: Podiatry;  Laterality: Right;   PERIPHERAL VASCULAR THROMBECTOMY Right 03/02/2020   Procedure: PERIPHERAL VASCULAR THROMBECTOMY;  Surgeon: Algernon Huxley, MD;  Location: [place] [facility];  Service: Cardiovascular;  Laterality: Right;   REPLACEMENT TOTAL KNEE Left 03/2010   Dr. Rudene Christians   TEE WITHOUT CARDIOVERSION N/A 09/19/2021   Procedure: TRANSESOPHAGEAL ECHOCARDIOGRAM (TEE);  Surgeon: Corey Skains, MD;  Location: [facility];  Service: Cardiovascular;  Laterality: N/A;   TOTAL HIP ARTHROPLASTY Left 12/11/2015   Procedure: TOTAL HIP ARTHROPLASTY ANTERIOR APPROACH;  Surgeon: Hessie Knows, MD;  Location: [facility];  Service: Orthopedics;  Laterality: Left;   TOTAL KNEE ARTHROPLASTY Right 03/11/2016   Procedure: TOTAL KNEE ARTHROPLASTY;  Surgeon: Hessie Knows, MD;  Location: [facility];  Service: Orthopedics;  Laterality: Right;    Social History Social History   Tobacco Use   Smoking status: Never   Smokeless tobacco: Never  Vaping Use   Vaping Use: Never used  Substance Use Topics   Alcohol use: No    Alcohol/week: 0.0 standard drinks   Drug use: No    Family History Family History  Problem Relation Age of Onset   Diabetes Mother     Hyperlipidemia Mother    Hypertension Mother    Diabetes Father    Hyperlipidemia Father    Hypertension Father    Obesity Father    Hypertension Sister    Hyperlipidemia Sister    Hyperlipidemia Sister    Breast cancer Neg Hx     Allergies  Allergen Reactions   Lisinopril Swelling     REVIEW OF SYSTEMS (Negative unless checked)  Constitutional: [] Weight loss  [] Fever  [] Chills Cardiac: [] Chest pain   [] Chest pressure   [] Palpitations   [] Shortness of breath when laying flat   [] Shortness of breath with exertion. Vascular:  [] Pain in legs with walking   [] Pain in legs at rest  [x] History of  DVT   [] Phlebitis   [x] Swelling in legs   [] Varicose veins   [] Non-healing ulcers Pulmonary:   [] Uses home oxygen   [] Productive cough   [] Hemoptysis   [] Wheeze  [] COPD   [] Asthma Neurologic:  [] Dizziness   [] Seizures   [] History of stroke   [] History of TIA  [] Aphasia   [] Vissual changes   [] Weakness or numbness in arm   [] Weakness or numbness in leg Musculoskeletal:   [] Joint swelling   [] Joint pain   [] Low back pain Hematologic:  [] Easy bruising  [] Easy bleeding   [] Hypercoagulable state   [] Anemic Gastrointestinal:  [] Diarrhea   [] Vomiting  [] Gastroesophageal reflux/heartburn   [] Difficulty swallowing. Genitourinary:  [] Chronic kidney disease   [] Difficult urination  [] Frequent urination   [] Blood in urine Skin:  [] Rashes   [] Ulcers  Psychological:  [] History of anxiety   []  History of major depression.  Physical Examination  Vitals:   09/26/21 1115  BP: 91/62  Pulse: 73  Resp: 16   There is no height or weight on file to calculate BMI. Gen: WD/WN, NAD seen in a wheelchair Head: [place]/AT, No temporalis wasting.  Ear/Nose/Throat: Hearing grossly intact, nares w/o erythema or drainage, pinna without lesions Eyes: PER, EOMI, sclera nonicteric.  Neck: Supple, no gross masses.  No JVD.  Pulmonary:  Good air movement, no audible wheezing, no use of accessory muscles.  Cardiac: RRR,  precordium not hyperdynamic. Vascular:  scattered varicosities present bilaterally.  Mild venous stasis changes to the legs bilaterally.  4+ soft pitting edema  Vessel Right Left  Radial Palpable Palpable  Gastrointestinal: soft, non-distended. No guarding/no peritoneal signs.  Musculoskeletal: M/S 5/5 throughout.  No deformity.  Neurologic: CN 2-12 intact. Pain and light touch intact in extremities.  Symmetrical.  Speech is fluent. Motor exam as listed above. Psychiatric: Judgment intact, Mood & affect appropriate for pt's clinical situation. Dermatologic: Venous rashes no ulcers noted.  No changes consistent with cellulitis. Lymph : No lichenification or skin changes of chronic lymphedema.  CBC Lab Results  Component Value Date   WBC 10.5 09/23/2021   HGB 8.2 (L) 09/23/2021   HCT 25.5 (L) 09/23/2021   MCV 95.1 09/23/2021   PLT 313 09/23/2021    BMET    Component Value Date/Time   NA 131 (L) 09/23/2021 0802   NA 142 11/26/2015 1122   NA 141 10/18/2012 1716   K 3.5 09/23/2021 0802   K 3.5 10/18/2012 1716   CL 98 09/23/2021 0802   CL 110 (H) 10/18/2012 1716   CO2 26 09/23/2021 0802   CO2 24 10/18/2012 1716   GLUCOSE 90 09/23/2021 0802   GLUCOSE 128 (H) 10/18/2012 1716   BUN 18 09/23/2021 0802   BUN 27 11/26/2015 1122   BUN 31 (H) 10/18/2012 1716   CREATININE 0.89 09/23/2021 0802   CREATININE 1.03 (H) 07/23/2021 1616   CALCIUM 7.8 (L) 09/23/2021 0802   CALCIUM 7.9 (L) 10/18/2012 1716   GFRNONAA >60 09/23/2021 0802   GFRNONAA 41 (L) 02/04/2021 1123   GFRAA 48 (L) 02/04/2021 1123   Estimated Creatinine Clearance: 83.5 mL/min (by C-G formula based on SCr of 0.89 mg/dL).  COAG Lab Results  Component Value Date   INR 1.8 (H) 09/19/2021   INR 1.2 07/15/2021   INR 1.1 05/01/2020    Radiology DG Chest 2 View  Result Date: 09/14/2021 CLINICAL DATA:  Fever. EXAM: CHEST - 2 VIEW COMPARISON:  July 15, 2021 FINDINGS: Decreased lung volumes are seen with mild areas of  atelectasis  and/or infiltrate noted within the bilateral lung bases. There is no evidence of a pleural effusion or pneumothorax. The heart size and mediastinal contours are within normal limits. Chronic deformities are seen involving the bilateral shoulders. Degenerative changes are seen within the visualized portion of the cervical spine. Multilevel degenerative changes are noted throughout the thoracic spine. IMPRESSION: Mild bibasilar atelectasis and/or infiltrate. Electronically Signed   By: Virgina Norfolk M.D.   On: 09/14/2021 19:39   CT CHEST WO CONTRAST  Result Date: 09/14/2021 CLINICAL DATA:  Fever of unknown origin. EXAM: CT CHEST WITHOUT CONTRAST TECHNIQUE: Multidetector CT imaging of the chest was performed following the standard protocol without IV contrast. COMPARISON:  February 23, 2021 FINDINGS: Cardiovascular: There is mild calcification of the aortic arch, without evidence of aortic aneurysm. Normal heart size with moderate severity coronary artery calcification. No pericardial effusion. Mediastinum/Nodes: A 2.3 cm x 1.7 cm axillary lymph node is suspected (axial CT image 21, CT series 2). A stable, similar appearing 3.0 cm x 2.4 cm low-attenuation soft tissue mass is seen in medial to the left glenoid (axial CT image 1, CT series 2). Thyroid gland, trachea, and esophagus demonstrate no significant findings. Lungs/Pleura: Mild atelectasis is seen within the posterior aspects of the bilateral lower lobes. There is no evidence of a pleural effusion or pneumothorax. Upper Abdomen: No acute abnormality. Musculoskeletal: Marked severity degenerative changes are seen involving the right shoulder, with a large chronic appearing deformity seen involving the left humeral head and left glenoid. Multilevel degenerative changes seen throughout the thoracic spine. IMPRESSION: 1. Mild bilateral lower lobe atelectasis. 2. Moderate severity coronary artery calcification. 3. Marked severity degenerative changes  involving the right shoulder, with a large chronic appearing deformity involving the left humeral head and left glenoid. 4. Aortic atherosclerosis. Aortic Atherosclerosis (ICD10-I70.0). Electronically Signed   By: Virgina Norfolk M.D.   On: 09/14/2021 22:33   CT Hip Right Wo Contrast  Result Date: 09/14/2021 CLINICAL DATA:  Right hip pain and fever. EXAM: CT OF THE RIGHT HIP WITHOUT CONTRAST TECHNIQUE: Multidetector CT imaging of the right hip was performed according to the standard protocol. Multiplanar CT image reconstructions were also generated. COMPARISON:  None. FINDINGS: Bones/Joint/Cartilage There is no evidence of acute fracture or dislocation. 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 Marked severity chronic and degenerative changes are seen involving the right hip. This is in the form of joint space narrowing, acetabular sclerosis and subchondral cyst formation. Mild flattening of the right humeral head is also seen. Ligaments Suboptimally assessed by CT. Muscles and Tendons Unremarkable Soft tissues A right hip effusion is noted. IMPRESSION: 1. No evidence of an acute fracture or dislocation. 2. Marked severity chronic and degenerative changes involving the right hip. 3. Right hip effusion. Electronically Signed   By: Virgina Norfolk M.D.   On: 09/14/2021 22:40   IR Fluoro Guide Ndl Plmt / BX  Result Date: 09/19/2021 INDICATION: Concern for septic right hip joint EXAM: Right hip aspiration using fluoroscopic guidance MEDICATIONS: None. ANESTHESIA/SEDATION: Local analgesia FLUOROSCOPY TIME:  Fluoroscopy Time: 0.8 minutes (8.7 mGy) COMPLICATIONS: None immediate. PROCEDURE: Informed written consent was obtained from the patient after a thorough discussion of the procedural risks, benefits and alternatives. All questions were addressed. Maximal Sterile Barrier Technique was utilized including caps, mask, sterile gowns, sterile gloves, sterile drape, hand hygiene  and skin antiseptic. A timeout was performed prior to the initiation of the procedure. The patient was placed supine on the exam table. The right  hip was examined under fluoroscopy, an appropriate skin entry site was marked. The overlying skin was prepped and draped in the standard sterile fashion. Local analgesia was obtained with 1% lidocaine. Using fluoroscopic guidance, a 20 gauge spinal needle was advanced into the right hip joint space. The stylet was removed, an approximately 3 mL of thin serosanguineous fluid was aspirated. The sample was sent to the lab for further analysis. Needle was removed, and hemostasis was achieved using manual pressure. A clean dressing was placed. The patient tolerated the procedure well without immediate complication. IMPRESSION: Successful right hip aspiration using fluoroscopic guidance with removal of approximally 3 mL of thin, serosanguineous fluid. A sample was sent to the lab for analysis. Electronically Signed   By: Albin Felling M.D.   On: 09/19/2021 12:59   ECHOCARDIOGRAM COMPLETE  Result Date: 09/19/2021    ECHOCARDIOGRAM REPORT   Patient Name:   BELLAGRACE SYLVAN Date of Exam: 09/19/2021 Medical Rec #:  [id number]       Height:       71.0 in Accession #:    [id number]      Weight:       300.0 lb Date of Birth:  10-Jan-1946       BSA:          2.505 m Patient Age:    73 years        BP:           103/55 mmHg Patient Gender: F               HR:           80 bpm. Exam Location:  [facility] Procedure: 2D Echo, Cardiac Doppler and Color Doppler Indications:     Bacteremia R78.81  History:         Patient has prior history of Echocardiogram examinations, most                  recent 09/07/2020. Signs/Symptoms:Syncope; Risk                  Factors:Diabetes and Hypertension. CKD.  Sonographer:     Sherrie Sport Referring Phys:  [id number] [name] Diagnosing Phys: Isaias Cowman MD  Sonographer Comments: Suboptimal apical window. IMPRESSIONS  1. Left ventricular ejection  fraction, by estimation, is 65 to 70%. The left ventricle has normal function. The left ventricle has no regional wall motion abnormalities. Left ventricular diastolic parameters are indeterminate.  2. Right ventricular systolic function is normal. The right ventricular size is normal.  3. The mitral valve is normal in structure. Mild mitral valve regurgitation. No evidence of mitral stenosis.  4. Tricuspid valve regurgitation is mild to moderate.  5. The aortic valve is normal in structure. Aortic valve regurgitation is not visualized. No aortic stenosis is present.  6. The inferior vena cava is normal in size with greater than 50% respiratory variability, suggesting right atrial pressure of 3 mmHg. FINDINGS  Left Ventricle: Left ventricular ejection fraction, by estimation, is 65 to 70%. The left ventricle has normal function. The left ventricle has no regional wall motion abnormalities. The left ventricular internal cavity size was normal in size. There is  no left ventricular hypertrophy. Left ventricular diastolic parameters are indeterminate. Right Ventricle: The right ventricular size is normal. No increase in right ventricular wall thickness. Right ventricular systolic function is normal. Left Atrium: Left atrial size was normal in size. Right Atrium: Right atrial size was normal in size. Pericardium: There is no  evidence of pericardial effusion. Mitral Valve: The mitral valve is normal in structure. Mild mitral valve regurgitation. No evidence of mitral valve stenosis. MV peak gradient, 4.2 mmHg. The mean mitral valve gradient is 2.0 mmHg. Tricuspid Valve: The tricuspid valve is normal in structure. Tricuspid valve regurgitation is mild to moderate. No evidence of tricuspid stenosis. Aortic Valve: The aortic valve is normal in structure. Aortic valve regurgitation is not visualized. No aortic stenosis is present. Aortic valve mean gradient measures 5.5 mmHg. Aortic valve peak gradient measures 9.5 mmHg.  Aortic valve area, by VTI measures 3.04 cm. Pulmonic Valve: The pulmonic valve was normal in structure. Pulmonic valve regurgitation is not visualized. No evidence of pulmonic stenosis. Aorta: The aortic root is normal in size and structure. Venous: The inferior vena cava is normal in size with greater than 50% respiratory variability, suggesting right atrial pressure of 3 mmHg. IAS/Shunts: No atrial level shunt detected by color flow Doppler.  LEFT VENTRICLE PLAX 2D LVIDd:         4.00 cm   Diastology LVIDs:         2.40 cm   LV e' medial:    5.66 cm/s LV PW:         1.00 cm   LV E/e' medial:  13.0 LV IVS:        1.20 cm   LV e' lateral:   7.18 cm/s LVOT diam:     2.00 cm   LV E/e' lateral: 10.3 LV SV:         81 LV SV Index:   32 LVOT Area:     3.14 cm  RIGHT VENTRICLE RV S prime:     13.50 cm/s TAPSE (M-mode): 2.6 cm LEFT ATRIUM             Index        RIGHT ATRIUM           Index LA diam:        3.70 cm 1.48 cm/m   RA Area:     12.20 cm LA Vol (A2C):   61.0 ml 24.35 ml/m  RA Volume:   24.90 ml  9.94 ml/m LA Vol (A4C):   38.0 ml 15.17 ml/m LA Biplane Vol: 50.6 ml 20.20 ml/m  AORTIC VALVE                     PULMONIC VALVE AV Area (Vmax):    3.27 cm      PV Vmax:        1.12 m/s AV Area (Vmean):   3.08 cm      PV Vmean:       81.800 cm/s AV Area (VTI):     3.04 cm      PV VTI:         0.188 m AV Vmax:           154.50 cm/s   PV Peak grad:   5.0 mmHg AV Vmean:          109.000 cm/s  PV Mean grad:   3.0 mmHg AV VTI:            0.266 m       RVOT Peak grad: 7 mmHg AV Peak Grad:      9.5 mmHg AV Mean Grad:      5.5 mmHg LVOT Vmax:         161.00 cm/s LVOT Vmean:        107.000 cm/s LVOT  VTI:          0.257 m LVOT/AV VTI ratio: 0.97  AORTA Ao Root diam: 3.40 cm MITRAL VALVE               TRICUSPID VALVE MV Area (PHT): 3.36 cm    TR Peak grad:   27.7 mmHg MV Area VTI:   3.48 cm    TR Vmax:        263.00 cm/s MV Peak grad:  4.2 mmHg MV Mean grad:  2.0 mmHg    SHUNTS MV Vmax:       1.03 m/s    Systemic VTI:   0.26 m MV Vmean:      59.5 cm/s   Systemic Diam: 2.00 cm MV Decel Time: 226 msec    Pulmonic VTI:  0.149 m MV E velocity: 73.70 cm/s MV A velocity: 69.00 cm/s MV E/A ratio:  1.07 Isaias Cowman MD Electronically signed by Isaias Cowman MD Signature Date/Time: 09/19/2021/1:40:49 PM    Final    [place] EKG SITE RITE  Result Date: 09/20/2021 If Site Rite image not attached, placement could not be confirmed due to current cardiac rhythm.    Assessment/Plan 1. Chronic venous hypertension due to deep vein thrombosis (DVT) Recommend:   No surgery or intervention at this point in time.  IVC filter is present.  We discussed removal but given her sedentary disposition in association with the venous hypertension and severe lymphedema I believe she is at very high risk for recurrent thrombosis and therefore after discussion we have decided the filter should remain.  Patient's duplex ultrasound of the venous system does not show DVT in the IVC or iliac veins.  The patient is on Eliquis and should continue this at 5 mg twice daily  Elevation was stressed, use of a recliner was discussed.  I have had a long discussion with the patient regarding DVT and post phlebitic changes such as swelling and why it  causes symptoms such as pain.  The patient will wear graduated compression stockings class 1 (20-30 mmHg), beginning after three full days of anticoagulation, on a daily basis a prescription was given. The patient will  beginning wearing the stockings first thing in the morning and removing them in the evening. The patient is instructed specifically not to sleep in the stockings.  In addition, behavioral modification including elevation during the day and avoidance of prolonged dependency will be initiated.    The patient will continue anticoagulation for now as there have not been any problems or complications at this point.   - VAS US AORTA/IVC/ILIACS; Future  2. Chronic venous insufficiency See 1 -  VAS US AORTA/IVC/ILIACS; Future  3. PAD (peripheral artery disease) (HCC)  Recommend:  The patient has evidence of atherosclerosis of the lower extremities with claudication.  The patient does not voice lifestyle limiting changes at this point in time.  Noninvasive studies do not suggest clinically significant change.  No invasive studies, angiography or surgery at this time The patient should continue walking and begin a more formal exercise program.  The patient should continue antiplatelet therapy and aggressive treatment of the lipid abnormalities  No changes in the patient's medications at this time  - VAS [place] ABI WITH/WO TBI; Future  4. Essential hypertension Continue antihypertensive medications as already ordered, these medications have been reviewed and there are no changes at this time.   5. AF (paroxysmal atrial fibrillation) (HCC) Continue antiarrhythmia medications as already ordered, these medications have been reviewed and  there are no changes at this time.  Continue anticoagulation as ordered by Cardiology Service   6. Type 2 diabetes mellitus with stage 3 chronic kidney disease, without long-term current use of insulin, unspecified whether stage 3a or 3b CKD ([place]) Continue hypoglycemic medications as already ordered, these medications have been reviewed and there are no changes at this time.  Hgb A1C to be monitored as already arranged by primary service     Hortencia Pilar, MD  09/26/2021 11:41 AM

## 2021-09-27 ENCOUNTER — Encounter (INDEPENDENT_AMBULATORY_CARE_PROVIDER_SITE_OTHER): Payer: Self-pay | Admitting: Vascular Surgery

## 2021-09-27 DIAGNOSIS — I739 Peripheral vascular disease, unspecified: Secondary | ICD-10-CM | POA: Insufficient documentation

## 2021-10-04 NOTE — Progress Notes (Signed)
Name: Becky Trevino   MRN: [id number]    DOB: March 06, 1946   Date:10/07/2021       Progress Note  Subjective  Chief Complaint  Follow Up  HPI  DMII with renal manifestation and obesity:  she denies polyuria, polyphagia or polydipsia. A1C done 09/2020 was 6.1 % tlast one was 5.4 % done during last admission to [facility] in 09/2021  She has CKI - last GFR 43 , dyslipidemia and obesity . She cannot take ACE due to angioedema   Obesity Morbid: she has been unable to be active, she is getting PT at [facility]. She is trying to eat small portions    History of hypotension:  she was  Midodrine . She is now off midodrine and tolerating 25 mg of metoprolol, heart rate is at goal, she states she states no longer having dizziness like she used to ,  BP towards low end of normal but improved since last visit     Chronic DVT  right popliteal vein: diagnosed 10/27/2019, she was on Xarelto and at one time Eliquis  but stopped secondary to iliopsoas hematoma, she still has some swelling of right thigh, she has an IV filter. Dr. Tasia Catchings had stopped Eliquis but Dr. Clayborn Bigness placed her back on it , we will make sure she is supposed to take it. Recently seen by Dr. Payton Mccallum.    Hyperlipidemia: taking Atorvastatin daily and denies side effects. No chest pain , no muscles pain LDL is at goal    Insomnia/OSA :Last Echo done by Dr. Clayborn Bigness showed mild pulmonary hypertension . Reminded her to resume CPAP , she states she wants to see Dr. Tami Ribas first before resuming CPAP , worried because of nose bleeds and cannot tolerate it . She would like to resume Trazodone since she is living at [facility] and is having more insomnia lately    OA: multiple joints, sees Ortho, Dr. Rudene Christians . She had revision of right knee replacement March 2019 because of infection. Still under the care of Ortho now also seeing Dr. Ola Spurr - ID. She is still on daily antibiotics - Amoxicillin and also Rocephin every two weeks since recent hospital  stay. Culture of synovial fluid did not grown bacteria  Left femoral nerve palsy:  seen by Dr. Rudene Christians, Dr. Manuella Ghazi, now seeing neurosurgeon at [facility] , Dr. Tamala Julian. She is able to walk short distances but s wearing brace because left knee is unstable due to femoral nerve palsy. She still  needs assistance bathing, cannot drive, cannot transfer alone . She was diagnosed with a hematoma of left iliopsoas during hospital stay 10/2019 , but she had repeat  CT pelvis 01/09/2020 that showed resolution of hematoma. She is was off Eliquis and  only on aspirin do  to history of hematoma. Dr. Tamala Julian reviewed her case and advised against doing the surgery to decompress the nerve.  She saw Dr. Clayborn Bigness in December and he advised her to resume Eliquis , she is not having any side effects at this time. Reached out to his office to confrim she is supposed to be on it and Delicia White confirmed    CKI stage III: GFR around 50's normal urine micro , not on ace at this time. BP is towards low end of normal , she had a drop during recent hospital stay but back to baseline prior to discharge.    Atherosclerosis of Aorta: she is on statin and back on Eliquis Denies side effects  Anemia: under the care of Dr. Tasia Catchings, HCT low but stable during hospital stay    Patient Active Problem List   Diagnosis Date Noted   PAD (peripheral artery disease) ([place]) 09/27/2021   Cellulitis of left lower extremity 09/15/2021   Elevated troponin level not due myocardial infarction 09/15/2021   AF (paroxysmal atrial fibrillation) ([place]) 09/15/2021   Fever 09/15/2021   Effusion of right hip 07/18/2021   Primary osteoarthritis of right hip 07/18/2021   Right hip joint effusion    Atherosclerosis of aorta ([place]) 06/07/2021   Anasarca    Hypothyroidism    Tachyarrhythmia    Acute renal failure superimposed on stage 3b chronic kidney disease (HCC)    Hypotension    Class 2 obesity without serious comorbidity with body mass index (BMI) of 39.0 to 39.9  in adult    Thrombocytopenia (HCC)    Mild protein-calorie malnutrition ([place])    Hyperbilirubinemia    Injury of left femoral nerve 08/28/2020   History of DVT (deep vein thrombosis) 06/25/2020   Lymphedema 06/25/2020   History of pelvic hematoma 02/10/2020   Foot ulcer, right, limited to breakdown of skin ([place]) 12/02/2019   Chronic venous hypertension due to deep vein thrombosis (DVT) 10/30/2019   Hematoma    Normocytic anemia 08/07/2019   Chronic infection of right knee ([place]) 11/25/2018   Infection and inflammatory reaction due to internal joint prosthesis, subsequent encounter 01/25/2018   Infection of total right knee replacement ([place]) 11/19/2017   Type 2 diabetes mellitus with stage 3b chronic kidney disease, without long-term current use of insulin ([place]) 11/06/2016   Primary osteoarthritis of knee 03/11/2016   Primary osteoarthritis of left hip 12/11/2015   Essential hypertension 04/21/2015   Edema leg 04/21/2015   Chronic kidney disease (CKD), stage III (moderate) ([place]) 04/21/2015   Chronic venous insufficiency 04/21/2015   DDD (degenerative disc disease), cervical 04/21/2015   Diabetes mellitus with renal manifestation ([place]) 04/21/2015   Hyperlipidemia 04/21/2015   Bladder cystocele 04/21/2015   Urinary incontinence 04/21/2015   Leg varices 04/21/2015   Insomnia 04/21/2015   Eczema intertrigo 04/21/2015   Obesity, Class III, BMI 40-49.9 (morbid obesity) ([place]) 04/21/2015   OSA (obstructive sleep apnea) 04/21/2015   Supraventricular premature beats 04/21/2015   Osteoarthritis of multiple joints 04/21/2015   H/O Spinal surgery 11/14/2013   Detrusor muscle hypertonia 11/07/2013    Past Surgical History:  Procedure Laterality Date   ABDOMINAL HYSTERECTOMY  1993   Total   BUNIONECTOMY Bilateral [place] Right 11/19/2017   Procedure: RIGHT KNEE POLY EXCHANGE WITH IRRIGATION AND DEBRIDEMENT;  Surgeon: Hessie Knows, MD;  Location: [facility] ORS;   Service: Orthopedics;  Laterality: Right;   IR FLUORO GUIDED NEEDLE PLC ASPIRATION/INJECTION LOC  09/19/2021   JOINT REPLACEMENT     bilateral knee   LAMINECTOMY  11/14/2013    Cervical Fusion , [facility], Dr. Delilah Shan   LASER ABLATION Bilateral 07/29/2012   Dr. Lucky Cowboy   LOWER EXTREMITY ANGIOGRAPHY Right 11/02/2019   Procedure: Lower Extremity Angiography;  Surgeon: Katha Cabal, MD;  Location: [place] [facility];  Service: Cardiovascular;  Laterality: Right;   ORIF ANKLE FRACTURE Right 09/02/2019   Procedure: OPEN REDUCTION INTERNAL FIXATION (ORIF) ANKLE FRACTURE BIMALLEOLAR;  Surgeon: [name], DPM;  Location: [facility] OR[facility];  Service: Podiatry;  Laterality: Right;   PERIPHERAL VASCULAR THROMBECTOMY Right 03/02/2020   Procedure: PERIPHERAL VASCULAR THROMBECTOMY;  Surgeon: Algernon Huxley, MD;  Location: [place] [facility];  Service: Cardiovascular;  Laterality: Right;   REPLACEMENT TOTAL KNEE Left 03/2010   Dr. Rudene Christians   TEE WITHOUT CARDIOVERSION N/A 09/19/2021   Procedure: TRANSESOPHAGEAL ECHOCARDIOGRAM (TEE);  Surgeon: Corey Skains, MD;  Location: [facility];  Service: Cardiovascular;  Laterality: N/A;   TOTAL HIP ARTHROPLASTY Left 12/11/2015   Procedure: TOTAL HIP ARTHROPLASTY ANTERIOR APPROACH;  Surgeon: Hessie Knows, MD;  Location: [facility];  Service: Orthopedics;  Laterality: Left;   TOTAL KNEE ARTHROPLASTY Right 03/11/2016   Procedure: TOTAL KNEE ARTHROPLASTY;  Surgeon: Hessie Knows, MD;  Location: [facility];  Service: Orthopedics;  Laterality: Right;    Family History  Problem Relation Age of Onset   Diabetes Mother    Hyperlipidemia Mother    Hypertension Mother    Diabetes Father    Hyperlipidemia Father    Hypertension Father    Obesity Father    Hypertension Sister    Hyperlipidemia Sister    Hyperlipidemia Sister    Breast cancer Neg Hx     Social History   Tobacco Use   Smoking status: Never   Smokeless tobacco: Never  Substance Use Topics   Alcohol use: No     Alcohol/week: 0.0 standard drinks     Current Outpatient Medications:    acetaminophen (TYLENOL) 650 MG CR tablet, Take 650-1,300 mg by mouth 2 (two) times daily as needed for pain., Disp: , Rfl:    amiodarone (PACERONE) 200 MG tablet, Take 200 mg by mouth 2 (two) times daily., Disp: , Rfl:    [START ON 10/27/2021] amoxicillin (AMOXIL) 500 MG capsule, Take 1 capsule (500 mg total) by mouth 3 (three) times daily. Chronic suppressive therapy to start 10/27/2021 after completion of IV ceftriaxone on 10/26/2021.  To remain on amoxicillin suppressive therapy indefinitely until infectious disease says may stop, Disp: 90 capsule, Rfl: 0   apixaban (ELIQUIS) 5 MG TABS tablet, Take 1 tablet by mouth 2 (two) times daily., Disp: , Rfl:    ASPIRIN LOW DOSE 81 MG EC tablet, TAKE 1 TABLET BY MOUTH EVERY DAY, Disp: 90 tablet, Rfl: 0   atorvastatin (LIPITOR) 40 MG tablet, Take 1 tablet (40 mg total) by mouth every evening., Disp: 90 tablet, Rfl: 1   benzonatate (TESSALON) 100 MG capsule, Take 1 capsule (100 mg total) by mouth 3 (three) times daily as needed., Disp: 20 capsule, Rfl: 0   Blood Glucose Monitoring Suppl (ACCU-CHEK GUIDE ME) w/Device KIT, 1 Device by Does not apply route once for 1 dose., Disp: 1 kit, Rfl: 0   Calcium Carbonate-Vitamin D 600-400 MG-UNIT tablet, Take 1 tablet by mouth 2 (two) times daily., Disp: 60 tablet, Rfl: 0   cefTRIAXone (ROCEPHIN) IVPB, Inject 2 g into the vein daily. Indication:  Recurrent Group B Streptococcal bacteremia / septic arthritis of right hip / extensive hardware Last Day of Therapy: 10/26/2021 Labs - Once weekly (Draw on Mon or Tues):  CBC/D and CMP and ESR, Disp: 34 Units, Rfl: 0   clonazePAM (KLONOPIN) 0.5 MG tablet, Take 1 tablet (0.5 mg total) by mouth 2 (two) times daily as needed for anxiety. Panic attack only, Disp: 5 tablet, Rfl: 0   escitalopram (LEXAPRO) 5 MG tablet, TAKE 1 TABLET BY MOUTH DAILY FOR ANXIETY. WAIT UNTIL FOLLOW UP WITH DR. CALLWOOD TO START THIS  MED, Disp: 90 tablet, Rfl: 0   fluticasone (FLONASE) 50 MCG/ACT nasal spray, SPRAY 2 SPRAYS INTO EACH NOSTRIL EVERY DAY, Disp: 48 mL, Rfl: 0   latanoprost (XALATAN) 0.005 % ophthalmic solution, SMARTSIG:In Eye(s),  Disp: , Rfl:    Magnesium Oxide 500 MG CAPS, Take 500 mg by mouth daily. , Disp: , Rfl:    metoprolol succinate (TOPROL-XL) 50 MG 24 hr tablet, Take 50 mg by mouth daily., Disp: , Rfl:    mirabegron ER (MYRBETRIQ) 50 MG TB24 tablet, Take 1 tablet (50 mg total) by mouth daily., Disp: 90 tablet, Rfl: 3   Oxycodone HCl 10 MG TABS, Take 1 tablet (10 mg total) by mouth every 6 (six) hours as needed., Disp: 10 tablet, Rfl: 0   polyethylene glycol (MIRALAX / GLYCOLAX) 17 g packet, Take 17 g by mouth daily., Disp: 14 each, Rfl: 0   potassium chloride (KLOR-CON) 10 MEQ tablet, Take 10 mEq by mouth daily., Disp: , Rfl:    SIMBRINZA 1-0.2 % SUSP, Place 1 drop into both eyes 2 (two) times daily., Disp: , Rfl:    sodium chloride (OCEAN) 0.65 % SOLN nasal spray, Place 1 spray into both nostrils 3 (three) times daily., Disp: 30 mL, Rfl: 0   torsemide (DEMADEX) 20 MG tablet, TAKE 1 TABLET BY MOUTH EVERY DAY, Disp: 90 tablet, Rfl: 1   traZODone (DESYREL) 50 MG tablet, TAKE 1/2 TO 1 TABLET BY MOUTH AT BEDTIME AS NEEDED FOR SLEEP., Disp: 90 tablet, Rfl: 0   levothyroxine (SYNTHROID) 25 MCG tablet, TAKE 1 TABLET (25 MCG TOTAL) BY MOUTH DAILY AT 6 (SIX) AM., Disp: 90 tablet, Rfl: 1  Allergies  Allergen Reactions   Lisinopril Swelling    I personally reviewed active problem list, medication list, allergies, family history, social history, health maintenance with the patient/caregiver today.   ROS  Ten systems reviewed and is negative except as mentioned in HPI    Objective  Vitals:   10/07/21 1034  BP: 110/68  Pulse: 70  Resp: 16  Temp: 98 F (36.7 C)  SpO2: 98%  Weight: 298 lb (135.2 kg)  Height: _0  (1.803 m)    Body mass index is 41.56 kg/m.  Physical Exam  Constitutional:  Patient appears well-developed and well-nourished. Obese  No distress.  HEENT: head atraumatic, normocephalic, pupils equal and reactive to light,  neck supple, Cardiovascular: Normal rate, regular rhythm and normal heart sounds.  No murmur heard. 2 plus  BLE edema. Pulmonary/Chest: Effort normal and breath sounds normal. No respiratory distress. Abdominal: Soft.  There is no tenderness. Muscular skeletal: sitting on wheelchair, left thigh internally rotated  Psychiatric: Patient has a normal mood and affect. behavior is normal. Judgment and thought content normal.   PHQ2/9: Depression screen [facility] 2/9 10/07/2021 08/01/2021 07/23/2021 06/21/2021 06/07/2021  Decreased Interest 0 0 0 0 0  Down, Depressed, Hopeless 0 0 0 0 0  PHQ - 2 Score 0 0 0 0 0  Altered sleeping 0 - - - -  Tired, decreased energy 0 - - - -  Change in appetite 0 - - - -  Feeling bad or failure about yourself  0 - - - -  Trouble concentrating 0 - - - -  Moving slowly or fidgety/restless 0 - - - -  Suicidal thoughts 0 - - - -  PHQ-9 Score 0 - - - -  Difficult doing work/chores - - - - -  Some recent data might be hidden    phq 9 is negative   Fall Risk: Fall Risk  10/07/2021 08/01/2021 07/23/2021 06/07/2021 02/04/2021  Falls in the past year? 0 0 - 0 0  Number falls in past yr: 0 0 - 0 0  Comment - - - - -  Injury with Fall? 0 0 - 0 0  Comment - - - - -  Risk for fall due to : Impaired mobility Impaired balance/gait;Impaired mobility Impaired mobility;Impaired balance/gait Impaired mobility -  Follow up Falls prevention discussed Falls prevention discussed Falls prevention discussed Falls prevention discussed -      Functional Status Survey: Is the patient deaf or have difficulty hearing?: No Does the patient have difficulty seeing, even when wearing glasses/contacts?: No Does the patient have difficulty concentrating, remembering, or making decisions?: No Does the patient have difficulty walking or climbing stairs?:  Yes Does the patient have difficulty dressing or bathing?: No Does the patient have difficulty doing errands alone such as visiting a doctor's office or shopping?: No    Assessment & Plan  1. Type 2 diabetes mellitus with diabetic neuropathy, without long-term current use of insulin (HCC)  - blood glucose meter kit and supplies; 1 each by Other route daily at 12 noon. Dispense based on patient and insurance preference. Check fsbs once a day  Dispense: 1 each; Refill: 0  2. Type 2 diabetes mellitus with stage 3 chronic kidney disease, without long-term current use of insulin, unspecified whether stage 3a or 3b CKD ([place])   3. Benign essential HTN  Towards low end of normal  4. B12 deficiency  Continue supplementation   5. Stage 3 chronic kidney disease, unspecified whether stage 3a or 3b CKD ([place])   6. Lower leg DVT (deep venous thromboembolism), chronic, right ([place])   7. Morbid obesity ([place])  Discussed with the patient the risk posed by an increased BMI. Discussed importance of portion control, calorie counting and at least 150 minutes of physical activity weekly. Avoid sweet beverages and drink more water. Eat at least 6 servings of fruit and vegetables daily    8. Pulmonary hypertension ([place])   9. Chronic deep vein thrombosis (DVT) of femoral vein of right lower extremity (HCC)   10. Gait difficulty   11. Hypothyroidism, unspecified type  - levothyroxine (SYNTHROID) 25 MCG tablet; Take 1 tablet (25 mcg total) by mouth daily at 6 (six) AM.  Dispense: 90 tablet; Refill: 1  12. Atherosclerosis of aorta ([place])   13. Other insomnia  - traZODone (DESYREL) 50 MG tablet; Take 0.5-1 tablets (25-50 mg total) by mouth at bedtime as needed. for sleep  Dispense: 90 tablet; Refill: 0

## 2021-10-07 ENCOUNTER — Other Ambulatory Visit: Payer: Self-pay

## 2021-10-07 ENCOUNTER — Encounter: Payer: Self-pay | Admitting: Family Medicine

## 2021-10-07 ENCOUNTER — Ambulatory Visit (INDEPENDENT_AMBULATORY_CARE_PROVIDER_SITE_OTHER): Payer: Medicare Other | Admitting: Family Medicine

## 2021-10-07 VITALS — BP 110/68 | HR 70 | Temp 98.0°F | Resp 16 | Ht 71.0 in | Wt 298.0 lb

## 2021-10-07 DIAGNOSIS — I825Z1 Chronic embolism and thrombosis of unspecified deep veins of right distal lower extremity: Secondary | ICD-10-CM

## 2021-10-07 DIAGNOSIS — E538 Deficiency of other specified B group vitamins: Secondary | ICD-10-CM | POA: Diagnosis not present

## 2021-10-07 DIAGNOSIS — I10 Essential (primary) hypertension: Secondary | ICD-10-CM

## 2021-10-07 DIAGNOSIS — I82511 Chronic embolism and thrombosis of right femoral vein: Secondary | ICD-10-CM

## 2021-10-07 DIAGNOSIS — E039 Hypothyroidism, unspecified: Secondary | ICD-10-CM

## 2021-10-07 DIAGNOSIS — I272 Pulmonary hypertension, unspecified: Secondary | ICD-10-CM

## 2021-10-07 DIAGNOSIS — E1122 Type 2 diabetes mellitus with diabetic chronic kidney disease: Secondary | ICD-10-CM

## 2021-10-07 DIAGNOSIS — E1140 Type 2 diabetes mellitus with diabetic neuropathy, unspecified: Secondary | ICD-10-CM

## 2021-10-07 DIAGNOSIS — R269 Unspecified abnormalities of gait and mobility: Secondary | ICD-10-CM

## 2021-10-07 DIAGNOSIS — I700 Atherosclerosis of aorta: Secondary | ICD-10-CM

## 2021-10-07 DIAGNOSIS — G4709 Other insomnia: Secondary | ICD-10-CM

## 2021-10-07 DIAGNOSIS — N183 Chronic kidney disease, stage 3 unspecified: Secondary | ICD-10-CM

## 2021-10-07 MED ORDER — TRAZODONE HCL 50 MG PO TABS: 25.0000 mg | ORAL_TABLET | Freq: Every evening | ORAL | 0 refills | Status: DC | PRN

## 2021-10-07 MED ORDER — LEVOTHYROXINE SODIUM 25 MCG PO TABS: 25.0000 ug | ORAL_TABLET | Freq: Every day | ORAL | 1 refills | Status: DC

## 2021-10-07 MED ORDER — BLOOD GLUCOSE METER KIT: 1.0000 | PACK | Freq: Every day | 0 refills | Status: AC

## 2021-10-09 ENCOUNTER — Ambulatory Visit (INDEPENDENT_AMBULATORY_CARE_PROVIDER_SITE_OTHER): Payer: Medicare Other | Admitting: Nurse Practitioner

## 2021-10-10 ENCOUNTER — Ambulatory Visit (INDEPENDENT_AMBULATORY_CARE_PROVIDER_SITE_OTHER): Payer: Medicare Other | Admitting: Nurse Practitioner

## 2021-10-10 ENCOUNTER — Other Ambulatory Visit: Payer: Self-pay

## 2021-10-10 ENCOUNTER — Telehealth: Payer: Self-pay

## 2021-10-10 ENCOUNTER — Ambulatory Visit: Payer: Medicare Other | Attending: Infectious Diseases | Admitting: Infectious Diseases

## 2021-10-10 DIAGNOSIS — Z79890 Hormone replacement therapy (postmenopausal): Secondary | ICD-10-CM | POA: Insufficient documentation

## 2021-10-10 DIAGNOSIS — I129 Hypertensive chronic kidney disease with stage 1 through stage 4 chronic kidney disease, or unspecified chronic kidney disease: Secondary | ICD-10-CM | POA: Insufficient documentation

## 2021-10-10 DIAGNOSIS — E039 Hypothyroidism, unspecified: Secondary | ICD-10-CM | POA: Insufficient documentation

## 2021-10-10 DIAGNOSIS — Z8614 Personal history of Methicillin resistant Staphylococcus aureus infection: Secondary | ICD-10-CM | POA: Insufficient documentation

## 2021-10-10 DIAGNOSIS — Z7901 Long term (current) use of anticoagulants: Secondary | ICD-10-CM | POA: Insufficient documentation

## 2021-10-10 DIAGNOSIS — Z79899 Other long term (current) drug therapy: Secondary | ICD-10-CM | POA: Diagnosis not present

## 2021-10-10 DIAGNOSIS — N183 Chronic kidney disease, stage 3 unspecified: Secondary | ICD-10-CM | POA: Diagnosis not present

## 2021-10-10 DIAGNOSIS — E1122 Type 2 diabetes mellitus with diabetic chronic kidney disease: Secondary | ICD-10-CM | POA: Insufficient documentation

## 2021-10-10 DIAGNOSIS — M00251 Other streptococcal arthritis, right hip: Secondary | ICD-10-CM | POA: Diagnosis not present

## 2021-10-10 DIAGNOSIS — R7881 Bacteremia: Secondary | ICD-10-CM

## 2021-10-10 NOTE — Progress Notes (Signed)
NAME: Becky Trevino  DOB: Nov 02, 1945  MRN: [id number]  Date/Time: 10/10/2021 10:54 AM  Subjective:   The purpose of this virtual visit is to provide medical care while limiting exposure to the novel coronavirus (COVID19) for both patient and office staff.   Consent was obtained for phone visit:  Yes.   Answered questions that patient had about telehealth interaction:  Yes.   I discussed the limitations, risks, security and privacy concerns of performing an evaluation and management service by telephone. I also discussed with the patient that there may be a patient responsible charge related to this service. The patient expressed understanding and agreed to proceed.   Patient Location: SNF Provider Location: office   Becky Trevino is a 76 y.o. 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, recurrent Group B strep bacteremia intially from the wound ORIF diabetes mellitus, hyperlipidemia, hypertension, degenerative disc , left gluteal pelvic hematoma after a fall in Nov 2020, cervical decompression surgery march 2015, DVT rt lower extremity , has IVC filter-  lymphedema legs Was recently in [facility] between 12/31-1/10/23 Severe pain rt hip and fever She had Group B strep bacteremia and rt hip joint was aspirated  and cell count was 4000 (95%)- It was decided to treat her like septic arthritis and because of many risk factors like having multiple hardware it is decided that after 6 weeks of IV ceftriaxone she will go on PO amoxicllin  559m TID indefinitely    Patient has a complicated Infection history  11/1-11/5/22  : epistaxis, nasal packing,  shock , rt hip pain Aspiration of the rt hip was not favoring infeciton- culture neg   Hospitalization 09/02/20-09/14/20 for left upper thigh cellulitis, MRSA bacteremia and group B strep bacteremia and left lower lobe infiltrate due to MRSA She got IV vanco followed by Daptomycin for a  total of 22 days   Feb 2021- Group B strep bacteremia Rt ankle fracture ORIF Dec 2020-  non healing wound with GBS and acinetobacter in Feb 2021- treated with 4 weeks of Iv antibiotic- unasyn -11/25/19   Was in the hospital between 08/07/19-08/09/19 after  she passed out at work and hit a metal pole She developed a left gluteal hematoma and 295 ml of dark purple blood aspirated by ortho on Dec , 11 2020 - no culture   In 2017 had rt knee replacement   In February 2019 she had swelling of the knee and an aspiration revealed 59,000 WBCs with 98% polymorphs.  Cultures were negative as well as for crystal analysis.  Sed rate was 76.  On March 7 , 2019 she had an incision and drainage and polyethylene exchange of the right knee.  The cultures grew coag negative staph.  PICC line was placed and she was given vancomycin and ceftriaxone for 6 weeks,  , culture had coag neg staph, was on suppressive doxy + rifampin because of retained prosthesis.she was on Doxycycline which has been discontinued in Jan 2023   Past Medical History:  Diagnosis Date   Cervical spondylosis    Chronic kidney disease    Stage 3   DDD (degenerative disc disease), cervical    [facility]   Diabetes mellitus without complication (HCC)    Diverticulosis    Hyperlipidemia    Hypertension    Intertrigo    Leg cramps    Leg varices    Obesity    OSA (obstructive  sleep apnea)    Symptomatic menopausal or female climacteric states    Syncope     Past Surgical History:  Procedure Laterality Date   ABDOMINAL HYSTERECTOMY  1993   Total   BUNIONECTOMY Bilateral 1993   I & D KNEE WITH POLY EXCHANGE Right 11/19/2017   Procedure: RIGHT KNEE POLY EXCHANGE WITH IRRIGATION AND DEBRIDEMENT;  Surgeon: Hessie Knows, MD;  Location: [facility];  Service: Orthopedics;  Laterality: Right;   IR FLUORO GUIDED NEEDLE PLC ASPIRATION/INJECTION LOC  09/19/2021   JOINT REPLACEMENT     bilateral knee   LAMINECTOMY  11/14/2013    Cervical  Fusion , [facility], Dr. Delilah Shan   LASER ABLATION Bilateral 07/29/2012   Dr. Lucky Cowboy   LOWER EXTREMITY ANGIOGRAPHY Right 11/02/2019   Procedure: Lower Extremity Angiography;  Surgeon: Katha Cabal, MD;  Location: [place] [facility];  Service: Cardiovascular;  Laterality: Right;   ORIF ANKLE FRACTURE Right 09/02/2019   Procedure: OPEN REDUCTION INTERNAL FIXATION (ORIF) ANKLE FRACTURE BIMALLEOLAR;  Surgeon: [name], DPM;  Location: [facility];  Service: Podiatry;  Laterality: Right;   PERIPHERAL VASCULAR THROMBECTOMY Right 03/02/2020   Procedure: PERIPHERAL VASCULAR THROMBECTOMY;  Surgeon: Algernon Huxley, MD;  Location: [place] [facility];  Service: Cardiovascular;  Laterality: Right;   REPLACEMENT TOTAL KNEE Left 03/2010   Dr. Rudene Christians   TEE WITHOUT CARDIOVERSION N/A 09/19/2021   Procedure: TRANSESOPHAGEAL ECHOCARDIOGRAM (TEE);  Surgeon: Corey Skains, MD;  Location: [facility];  Service: Cardiovascular;  Laterality: N/A;   TOTAL HIP ARTHROPLASTY Left 12/11/2015   Procedure: TOTAL HIP ARTHROPLASTY ANTERIOR APPROACH;  Surgeon: Hessie Knows, MD;  Location: [facility];  Service: Orthopedics;  Laterality: Left;   TOTAL KNEE ARTHROPLASTY Right 03/11/2016   Procedure: TOTAL KNEE ARTHROPLASTY;  Surgeon: Hessie Knows, MD;  Location: [facility];  Service: Orthopedics;  Laterality: Right;    Social History   Socioeconomic History   Marital status: Married    Spouse name: Not on file   Number of children: 1   Years of education: Not on file   Highest education level: Not on file  Occupational History   Not on file  Tobacco Use   Smoking status: Never   Smokeless tobacco: Never  Vaping Use   Vaping Use: Never used  Substance and Sexual Activity   Alcohol use: No    Alcohol/week: 0.0 standard drinks   Drug use: No   Sexual activity: Yes    Partners: Male  Other Topics Concern   Not on file  Social History Narrative   Married, one adopted grown child    Social Determinants of Systems developer Strain: Low Risk    Difficulty of Paying Living Expenses: Not hard at all  Food Insecurity: No Food Insecurity   Worried About Charity fundraiser in the Last Year: Never true   Arboriculturist in the Last Year: Never true  Transportation Needs: No Transportation Needs   Lack of Transportation (Medical): No   Lack of Transportation (Non-Medical): No  Physical Activity: Insufficiently Active   Days of Exercise per Week: 2 days   Minutes of Exercise per Session: 30 min  Stress: No Stress Concern Present   Feeling of Stress : Only a little  Social Connections: Engineer, building services of Communication with Friends and Family: More than three times a week   Frequency of Social Gatherings with Friends and Family: More than three times a week   Attends Religious Services:  More than 4 times per year   Active Member of Clubs or Organizations: Yes   Attends Music therapist: More than 4 times per year   Marital Status: Married  Human resources officer Violence: Not At Risk   Fear of Current or Ex-Partner: No   Emotionally Abused: No   Physically Abused: No   Sexually Abused: No    Family History  Problem Relation Age of Onset   Diabetes Mother    Hyperlipidemia Mother    Hypertension Mother    Diabetes Father    Hyperlipidemia Father    Hypertension Father    Obesity Father    Hypertension Sister    Hyperlipidemia Sister    Hyperlipidemia Sister    Breast cancer Neg Hx    Allergies  Allergen Reactions   Lisinopril Swelling   I? Current Outpatient Medications  Medication Sig Dispense Refill   acetaminophen (TYLENOL) 650 MG CR tablet Take 650-1,300 mg by mouth 2 (two) times daily as needed for pain.     amiodarone (PACERONE) 200 MG tablet Take 200 mg by mouth 2 (two) times daily.     [START ON 10/27/2021] amoxicillin (AMOXIL) 500 MG capsule Take 1 capsule (500 mg total) by mouth 3 (three) times daily. Chronic suppressive therapy to start 10/27/2021 after  completion of IV ceftriaxone on 10/26/2021.  To remain on amoxicillin suppressive therapy indefinitely until infectious disease says may stop 90 capsule 0   apixaban (ELIQUIS) 5 MG TABS tablet Take 1 tablet by mouth 2 (two) times daily.     atorvastatin (LIPITOR) 40 MG tablet Take 1 tablet (40 mg total) by mouth every evening. 90 tablet 1   blood glucose meter kit and supplies 1 each by Other route daily at 12 noon. Dispense based on patient and insurance preference. Check fsbs once a day 1 each 0   Calcium Carbonate-Vitamin D 600-400 MG-UNIT tablet Take 1 tablet by mouth 2 (two) times daily. 60 tablet 0   cefTRIAXone (ROCEPHIN) IVPB Inject 2 g into the vein daily. Indication:  Recurrent Group B Streptococcal bacteremia / septic arthritis of right hip / extensive hardware Last Day of Therapy: 10/26/2021 Labs - Once weekly (Draw on Mon or Tues):  CBC/D and CMP and ESR 34 Units 0   escitalopram (LEXAPRO) 5 MG tablet TAKE 1 TABLET BY MOUTH DAILY FOR ANXIETY. WAIT UNTIL FOLLOW UP WITH DR. CALLWOOD TO START THIS MED 90 tablet 0   fluticasone (FLONASE) 50 MCG/ACT nasal spray SPRAY 2 SPRAYS INTO EACH NOSTRIL EVERY DAY 48 mL 0   latanoprost (XALATAN) 0.005 % ophthalmic solution SMARTSIG:In Eye(s)     levothyroxine (SYNTHROID) 25 MCG tablet Take 1 tablet (25 mcg total) by mouth daily at 6 (six) AM. 90 tablet 1   Magnesium Oxide 500 MG CAPS Take 500 mg by mouth daily.      metoprolol succinate (TOPROL-XL) 50 MG 24 hr tablet Take 50 mg by mouth daily.     mirabegron ER (MYRBETRIQ) 50 MG TB24 tablet Take 1 tablet (50 mg total) by mouth daily. 90 tablet 3   Oxycodone HCl 10 MG TABS Take 1 tablet (10 mg total) by mouth every 6 (six) hours as needed. 10 tablet 0   polyethylene glycol (MIRALAX / GLYCOLAX) 17 g packet Take 17 g by mouth daily. 14 each 0   potassium chloride (KLOR-CON) 10 MEQ tablet Take 10 mEq by mouth daily.     SIMBRINZA 1-0.2 % SUSP Place 1 drop into both eyes 2 (two) times  daily.     sodium  chloride (OCEAN) 0.65 % SOLN nasal spray Place 1 spray into both nostrils 3 (three) times daily. 30 mL 0   torsemide (DEMADEX) 20 MG tablet TAKE 1 TABLET BY MOUTH EVERY DAY 90 tablet 1   traZODone (DESYREL) 50 MG tablet Take 0.5-1 tablets (25-50 mg total) by mouth at bedtime as needed. for sleep 90 tablet 0   ASPIRIN LOW DOSE 81 MG EC tablet TAKE 1 TABLET BY MOUTH EVERY DAY (Patient not taking: Reported on 10/10/2021) 90 tablet 0   No current facility-administered medications for this visit.     Abtx:  Anti-infectives (From admission, onward)    None       REVIEW OF SYSTEMS:  Const: negative fever, negative chills, negative weight loss Eyes: negative diplopia or visual changes, negative eye pain ENT: negative coryza, negative sore throat Resp: negative cough, hemoptysis, dyspnea Cards: negative for chest pain, palpitations, lower extremity edema GU: negative for frequency, dysuria and hematuria GI: Negative for abdominal pain, diarrhea, bleeding, constipation Skin: negative for rash and pruritus Heme: negative for easy bruising and gum/nose bleeding MS: rt hip pain Neurolo:negative for headaches, dizziness, vertigo, memory problems  Psych: negative for feelings of anxiety, depression  Endocrine:  thyroid, diabetes Allergy/Immunology- as above Objective:  VITALS:  There were no vitals taken for this visit. PHYSICAL EXAM:  NO physical examination ? Impression/Recommendation ?Group B streptococcal bacteremia- third episode in 24  TEE on 09/19/21 no vegetation ? 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  Doxy does not treat GBS-  as she took it for 3 years Doxy was discontinued   Left THA- clinically no evidence of infection    had pelvic/gluteal hematoma after fall Nov 2020- Had cellulitis and MRSA bacteremia and GBS strep bacteremia in Dec 2021 She also had MRSA pneumonia during that hospitalization   DM     Afib on eliquis, amiodarone and metoprolol   Hypothyroidism on synthroid ? Will see her on 12/17/21? ___________________________________________________ Discussed with patient the management plant Spoke to [facility] to fax labs Total time spent 17 minutes

## 2021-10-10 NOTE — Telephone Encounter (Signed)
Faxed copy of weekly lab order that was attached to patient discharge from hospital. She is at [facility] and I did speak to [place] advising I am faxing treatment plan. Picc order attached to pull 10/26/2021 upon completoion of IV Abx. Also states  plan for oral abx to begin 10/27/2021.

## 2021-10-10 NOTE — Telephone Encounter (Signed)
Called and requested lab results from [facility] they will fax to [phone].

## 2021-10-11 ENCOUNTER — Other Ambulatory Visit: Payer: Self-pay

## 2021-10-11 ENCOUNTER — Ambulatory Visit (INDEPENDENT_AMBULATORY_CARE_PROVIDER_SITE_OTHER): Payer: Medicare Other | Admitting: Nurse Practitioner

## 2021-10-11 ENCOUNTER — Encounter (INDEPENDENT_AMBULATORY_CARE_PROVIDER_SITE_OTHER): Payer: Self-pay | Admitting: Nurse Practitioner

## 2021-10-11 VITALS — BP 101/67 | HR 67 | Resp 16

## 2021-10-11 DIAGNOSIS — I10 Essential (primary) hypertension: Secondary | ICD-10-CM | POA: Diagnosis not present

## 2021-10-11 DIAGNOSIS — I890 Lymphedema, not elsewhere classified: Secondary | ICD-10-CM

## 2021-10-12 NOTE — Progress Notes (Signed)
Subjective:    Patient ID: Becky Trevino, female    DOB: [birth year], 76 y.o.   MRN: [id number] Chief Complaint  Patient presents with   Follow-up    6 month follow up    Becky Trevino is a 76 year old female that presents today for follow-up evaluation of lymphedema.  The patient was recently hospitalized due to this hospitalization she has been essentially better rehabilitation facility.  Prior to going to this facility, the patient was very diligent about her conservative therapy including use of compression wraps and use of her lymphedema pump.  However since she has been in his rehab facility she notes that they will not place the wraps on her or have her utilize the pump without an order.  Otherwise she has been working with physical therapy to try to regain her strength and mobility.  Currently there are no open wounds or ulcerations but she does note that she is having much more swelling in her lower extremities that she has had previously.   Review of Systems  Cardiovascular:  Positive for leg swelling.  Neurological:  Positive for weakness.      Objective:   Physical Exam Vitals reviewed.  HENT:     Head: Normocephalic.  Cardiovascular:     Rate and Rhythm: Normal rate.  Pulmonary:     Effort: Pulmonary effort is normal.  Musculoskeletal:     Right lower leg: 3+ Edema present.     Left lower leg: 3+ Edema present.  Skin:    General: Skin is warm and dry.  Neurological:     Mental Status: She is alert and oriented to person, place, and time.     Motor: Weakness present.     Gait: Gait abnormal.  Psychiatric:        Mood and Affect: Mood normal.        Behavior: Behavior normal.        Thought Content: Thought content normal.        Judgment: Judgment normal.    BP 101/67 (BP Location: Left Arm)    Pulse 67    Resp 16   Past Medical History:  Diagnosis Date   Cervical spondylosis    Chronic kidney disease    Stage 3   DDD (degenerative disc disease),  cervical    [facility]   Diabetes mellitus without complication (HCC)    Diverticulosis    Hyperlipidemia    Hypertension    Intertrigo    Leg cramps    Leg varices    Obesity    OSA (obstructive sleep apnea)    Symptomatic menopausal or female climacteric states    Syncope     Social History   Socioeconomic History   Marital status: Married    Spouse name: Not on file   Number of children: 1   Years of education: Not on file   Highest education level: Not on file  Occupational History   Not on file  Tobacco Use   Smoking status: Never   Smokeless tobacco: Never  Vaping Use   Vaping Use: Never used  Substance and Sexual Activity   Alcohol use: No    Alcohol/week: 0.0 standard drinks   Drug use: No   Sexual activity: Yes    Partners: Male  Other Topics Concern   Not on file  Social History Narrative   Married, one adopted grown child    Social Determinants of Health   Financial Resource Strain: Low Risk  Difficulty of Paying Living Expenses: Not hard at all  Food Insecurity: No Food Insecurity   Worried About [place] in the Last Year: Never true   Ran Out of Food in the Last Year: Never true  Transportation Needs: No Transportation Needs   Lack of Transportation (Medical): No   Lack of Transportation (Non-Medical): No  Physical Activity: Insufficiently Active   Days of Exercise per Week: 2 days   Minutes of Exercise per Session: 30 min  Stress: No Stress Concern Present   Feeling of Stress : Only a little  Social Connections: Engineer, building services of Communication with Friends and Family: More than three times a week   Frequency of Social Gatherings with Friends and Family: More than three times a week   Attends Religious Services: More than 4 times per year   Active Member of [employer] or Organizations: Yes   Attends Music therapist: More than 4 times per year   Marital Status: Married  Human resources officer Violence:  Not At Risk   Fear of Current or Ex-Partner: No   Emotionally Abused: No   Physically Abused: No   Sexually Abused: No    Past Surgical History:  Procedure Laterality Date   ABDOMINAL HYSTERECTOMY  1993   Total   BUNIONECTOMY Bilateral [place] Right 11/19/2017   Procedure: RIGHT KNEE [place];  Surgeon: Hessie Knows, MD;  Location: [facility];  Service: Orthopedics;  Laterality: Right;   IR FLUORO GUIDED NEEDLE PLC ASPIRATION/INJECTION LOC  09/19/2021   JOINT REPLACEMENT     bilateral knee   LAMINECTOMY  11/14/2013    Cervical Fusion , [facility], Dr. Delilah Shan   LASER ABLATION Bilateral 07/29/2012   Dr. Lucky Cowboy   LOWER EXTREMITY ANGIOGRAPHY Right 11/02/2019   Procedure: Lower Extremity Angiography;  Surgeon: Katha Cabal, MD;  Location: [place] [facility];  Service: Cardiovascular;  Laterality: Right;   ORIF ANKLE FRACTURE Right 09/02/2019   Procedure: OPEN REDUCTION INTERNAL FIXATION (ORIF) ANKLE FRACTURE BIMALLEOLAR;  Surgeon: [name], DPM;  Location: [facility];  Service: Podiatry;  Laterality: Right;   PERIPHERAL VASCULAR THROMBECTOMY Right 03/02/2020   Procedure: PERIPHERAL VASCULAR THROMBECTOMY;  Surgeon: Algernon Huxley, MD;  Location: [place] [facility];  Service: Cardiovascular;  Laterality: Right;   REPLACEMENT TOTAL KNEE Left 03/2010   Dr. Rudene Christians   TEE WITHOUT CARDIOVERSION N/A 09/19/2021   Procedure: TRANSESOPHAGEAL ECHOCARDIOGRAM (TEE);  Surgeon: Corey Skains, MD;  Location: [facility];  Service: Cardiovascular;  Laterality: N/A;   TOTAL HIP ARTHROPLASTY Left 12/11/2015   Procedure: TOTAL HIP ARTHROPLASTY ANTERIOR APPROACH;  Surgeon: Hessie Knows, MD;  Location: [facility];  Service: Orthopedics;  Laterality: Left;   TOTAL KNEE ARTHROPLASTY Right 03/11/2016   Procedure: TOTAL KNEE ARTHROPLASTY;  Surgeon: Hessie Knows, MD;  Location: [facility];  Service: Orthopedics;  Laterality: Right;    Family History  Problem  Relation Age of Onset   Diabetes Mother    Hyperlipidemia Mother    Hypertension Mother    Diabetes Father    Hyperlipidemia Father    Hypertension Father    Obesity Father    Hypertension Sister    Hyperlipidemia Sister    Hyperlipidemia Sister    Breast cancer Neg Hx     Allergies  Allergen Reactions   Lisinopril Swelling    CBC Latest Ref Rng & Units 09/23/2021 09/20/2021 09/19/2021  WBC 4.0 - 10.5  K/uL 10.5 10.8(H) 9.2  Hemoglobin 12.0 - 15.0 g/dL 8.2(L) 8.5(L) 8.9(L)  Hematocrit 36.0 - 46.0 % 25.5(L) 25.5(L) 27.8(L)  Platelets 150 - 400 K/uL 313 228 199      CMP     Component Value Date/Time   NA 131 (L) 09/23/2021 0802   NA 142 11/26/2015 1122   NA 141 10/18/2012 1716   K 3.5 09/23/2021 0802   K 3.5 10/18/2012 1716   CL 98 09/23/2021 0802   CL 110 (H) 10/18/2012 1716   CO2 26 09/23/2021 0802   CO2 24 10/18/2012 1716   GLUCOSE 90 09/23/2021 0802   GLUCOSE 128 (H) 10/18/2012 1716   BUN 18 09/23/2021 0802   BUN 27 11/26/2015 1122   BUN 31 (H) 10/18/2012 1716   CREATININE 0.89 09/23/2021 0802   CREATININE 1.03 (H) 07/23/2021 1616   CALCIUM 7.8 (L) 09/23/2021 0802   CALCIUM 7.9 (L) 10/18/2012 1716   PROT 7.5 09/15/2021 0533   PROT 7.6 11/26/2015 1122   PROT 8.0 05/22/2012 2206   ALBUMIN 3.2 (L) 09/15/2021 0533   ALBUMIN 4.3 11/26/2015 1122   ALBUMIN 3.4 05/22/2012 2206   AST 21 09/15/2021 0533   AST 34 05/22/2012 2206   ALT 18 09/15/2021 0533   ALT 31 05/22/2012 2206   ALKPHOS 60 09/15/2021 0533   ALKPHOS 115 05/22/2012 2206   BILITOT 1.1 09/15/2021 0533   BILITOT 0.3 11/26/2015 1122   BILITOT 0.3 05/22/2012 2206   GFRNONAA >60 09/23/2021 0802   GFRNONAA 41 (L) 02/04/2021 1123   GFRAA 48 (L) 02/04/2021 1123     No results found.     Assessment & Plan:   1. Lymphedema Prior to the patient staying in a rehab facility she recently diligent about wearing her compression wraps, however now she needs assistance.  Orders were sent to the facility to  begin utilizing her compression wraps daily as well as her lymphedema pump which was helpful in helping gain control of her swelling.  The patient does have a follow-up appointment later this year.  We will plan on having the patient keep that appointment or follow-up sooner if there are issues that arise.  2. Benign essential HTN Continue antihypertensive medications as already ordered, these medications have been reviewed and there are no changes at this time.    Current Outpatient Medications on File Prior to Visit  Medication Sig Dispense Refill   acetaminophen (TYLENOL) 650 MG CR tablet Take 650-1,300 mg by mouth 2 (two) times daily as needed for pain.     amiodarone (PACERONE) 200 MG tablet Take 200 mg by mouth 2 (two) times daily.     [START ON 10/27/2021] amoxicillin (AMOXIL) 500 MG capsule Take 1 capsule (500 mg total) by mouth 3 (three) times daily. Chronic suppressive therapy to start 10/27/2021 after completion of IV ceftriaxone on 10/26/2021.  To remain on amoxicillin suppressive therapy indefinitely until infectious disease says may stop 90 capsule 0   apixaban (ELIQUIS) 5 MG TABS tablet Take 1 tablet by mouth 2 (two) times daily.     atorvastatin (LIPITOR) 40 MG tablet Take 1 tablet (40 mg total) by mouth every evening. 90 tablet 1   blood glucose meter kit and supplies 1 each by Other route daily at 12 noon. Dispense based on patient and insurance preference. Check fsbs once a day 1 each 0   Calcium Carbonate-Vitamin D 600-400 MG-UNIT tablet Take 1 tablet by mouth 2 (two) times daily. 60 tablet 0   cefTRIAXone (ROCEPHIN)  IVPB Inject 2 g into the vein daily. Indication:  Recurrent Group B Streptococcal bacteremia / septic arthritis of right hip / extensive hardware Last Day of Therapy: 10/26/2021 Labs - Once weekly (Draw on Mon or Tues):  CBC/D and CMP and ESR 34 Units 0   escitalopram (LEXAPRO) 5 MG tablet TAKE 1 TABLET BY MOUTH DAILY FOR ANXIETY. WAIT UNTIL FOLLOW UP WITH DR. CALLWOOD  TO START THIS MED 90 tablet 0   fluticasone (FLONASE) 50 MCG/ACT nasal spray SPRAY 2 SPRAYS INTO EACH NOSTRIL EVERY DAY 48 mL 0   latanoprost (XALATAN) 0.005 % ophthalmic solution SMARTSIG:In Eye(s)     levothyroxine (SYNTHROID) 25 MCG tablet Take 1 tablet (25 mcg total) by mouth daily at 6 (six) AM. 90 tablet 1   Magnesium Oxide 500 MG CAPS Take 500 mg by mouth daily.      metoprolol succinate (TOPROL-XL) 50 MG 24 hr tablet Take 50 mg by mouth daily.     mirabegron ER (MYRBETRIQ) 50 MG TB24 tablet Take 1 tablet (50 mg total) by mouth daily. 90 tablet 3   Oxycodone HCl 10 MG TABS Take 1 tablet (10 mg total) by mouth every 6 (six) hours as needed. 10 tablet 0   polyethylene glycol (MIRALAX / GLYCOLAX) 17 g packet Take 17 g by mouth daily. 14 each 0   potassium chloride (KLOR-CON) 10 MEQ tablet Take 10 mEq by mouth daily.     SIMBRINZA 1-0.2 % SUSP Place 1 drop into both eyes 2 (two) times daily.     sodium chloride (OCEAN) 0.65 % SOLN nasal spray Place 1 spray into both nostrils 3 (three) times daily. 30 mL 0   torsemide (DEMADEX) 20 MG tablet TAKE 1 TABLET BY MOUTH EVERY DAY 90 tablet 1   traZODone (DESYREL) 50 MG tablet Take 0.5-1 tablets (25-50 mg total) by mouth at bedtime as needed. for sleep 90 tablet 0   ASPIRIN LOW DOSE 81 MG EC tablet TAKE 1 TABLET BY MOUTH EVERY DAY (Patient not taking: Reported on 10/10/2021) 90 tablet 0   No current facility-administered medications on file prior to visit.    There are no Patient Instructions on file for this visit. No follow-ups on file.   Kris Hartmann, NP

## 2021-10-13 ENCOUNTER — Encounter (INDEPENDENT_AMBULATORY_CARE_PROVIDER_SITE_OTHER): Payer: Self-pay | Admitting: Nurse Practitioner

## 2021-10-15 ENCOUNTER — Other Ambulatory Visit: Payer: Self-pay

## 2021-10-15 ENCOUNTER — Ambulatory Visit
Admission: RE | Admit: 2021-10-15 | Discharge: 2021-10-15 | Disposition: A | Discharge status: Home / Self Care | Payer: Medicare Other | Source: Ambulatory Visit | Attending: Family Medicine | Admitting: Family Medicine

## 2021-10-15 ENCOUNTER — Other Ambulatory Visit: Payer: Self-pay | Admitting: Family Medicine

## 2021-10-15 DIAGNOSIS — Z1231 Encounter for screening mammogram for malignant neoplasm of breast: Secondary | ICD-10-CM | POA: Diagnosis not present

## 2021-10-15 DIAGNOSIS — Z780 Asymptomatic menopausal state: Secondary | ICD-10-CM

## 2021-10-17 ENCOUNTER — Telehealth: Payer: Self-pay | Admitting: *Deleted

## 2021-10-17 NOTE — Telephone Encounter (Signed)
ERRONEOUS

## 2021-10-18 ENCOUNTER — Telehealth (INDEPENDENT_AMBULATORY_CARE_PROVIDER_SITE_OTHER): Payer: Self-pay

## 2021-10-18 NOTE — Telephone Encounter (Signed)
Aggie Hacker the director of rehab at [facility] left a voicemail asking if the settings for the patient lymphedema pump changing or staying the same as previous . I spoke with Eulogio Ditch NP and she recommended that the settings did not need to change at this time. Kathlee Nations has been made aware with medical advice.

## 2021-10-21 ENCOUNTER — Telehealth: Payer: Self-pay

## 2021-10-21 NOTE — Telephone Encounter (Signed)
Patient calling to report PICC Line came out. She states [facility] has replaced the Picc Line and no doses were missed.

## 2021-10-25 ENCOUNTER — Telehealth: Payer: Self-pay

## 2021-10-25 NOTE — Progress Notes (Signed)
Name: Becky Trevino   MRN: [id number]    DOB: 1945/11/10   Date:10/28/2021       Progress Note  Subjective  [facility] Follow Up  HPI  Weakness and gait difficulty: Mili was hospitalized early January and was discharged to [facility] for Rehab. She was sent initially for IV antibiotics through her pic line and also for gait instability. She is still not back to her pre admission strength. She can only transfer independently from bed to wheelchair, but not to the commode , unable to stan on her own, this past weekend it was the first day that they stood her up. Her husband still works and is unable to help her transfer do to his age and not being home all day. She needs to stay at [facility] to continue rehab prior to discharge so she can care for herself.   She still needs bathing, dressing, toileting.    Patient Active Problem List   Diagnosis Date Noted   PAD (peripheral artery disease) ([place]) 09/27/2021   Cellulitis of left lower extremity 09/15/2021   Elevated troponin level not due myocardial infarction 09/15/2021   AF (paroxysmal atrial fibrillation) ([place]) 09/15/2021   Fever 09/15/2021   Effusion of right hip 07/18/2021   Primary osteoarthritis of right hip 07/18/2021   Right hip joint effusion    Atherosclerosis of aorta ([place]) 06/07/2021   Anasarca    Hypothyroidism    Tachyarrhythmia    Acute renal failure superimposed on stage 3b chronic kidney disease (HCC)    Hypotension    Class 2 obesity without serious comorbidity with body mass index (BMI) of 39.0 to 39.9 in adult    Thrombocytopenia (HCC)    Mild protein-calorie malnutrition ([place])    Hyperbilirubinemia    Injury of left femoral nerve 08/28/2020   History of DVT (deep vein thrombosis) 06/25/2020   Lymphedema 06/25/2020   History of pelvic hematoma 02/10/2020   Foot ulcer, right, limited to breakdown of skin ([place]) 12/02/2019   Chronic venous hypertension due to deep vein thrombosis (DVT)  10/30/2019   Hematoma    Normocytic anemia 08/07/2019   Chronic infection of right knee ([place]) 11/25/2018   Infection and inflammatory reaction due to internal joint prosthesis, subsequent encounter 01/25/2018   Infection of total right knee replacement ([place]) 11/19/2017   Type 2 diabetes mellitus with stage 3b chronic kidney disease, without long-term current use of insulin ([place]) 11/06/2016   Primary osteoarthritis of knee 03/11/2016   Primary osteoarthritis of left hip 12/11/2015   Essential hypertension 04/21/2015   Edema leg 04/21/2015   Chronic kidney disease (CKD), stage III (moderate) ([place]) 04/21/2015   Chronic venous insufficiency 04/21/2015   DDD (degenerative disc disease), cervical 04/21/2015   Diabetes mellitus with renal manifestation ([place]) 04/21/2015   Hyperlipidemia 04/21/2015   Bladder cystocele 04/21/2015   Urinary incontinence 04/21/2015   Leg varices 04/21/2015   Insomnia 04/21/2015   Eczema intertrigo 04/21/2015   Obesity, Class III, BMI 40-49.9 (morbid obesity) ([place]) 04/21/2015   OSA (obstructive sleep apnea) 04/21/2015   Supraventricular premature beats 04/21/2015   Osteoarthritis of multiple joints 04/21/2015   H/O Spinal surgery 11/14/2013   Detrusor muscle hypertonia 11/07/2013    Past Surgical History:  Procedure Laterality Date   ABDOMINAL HYSTERECTOMY  1993   Total   BUNIONECTOMY Bilateral [place] Right 11/19/2017   Procedure: RIGHT KNEE POLY EXCHANGE WITH IRRIGATION AND DEBRIDEMENT;  Surgeon:  Hessie Knows, MD;  Location: [facility] OR[facility];  Service: Orthopedics;  Laterality: Right;   IR FLUORO GUIDED NEEDLE PLC ASPIRATION/INJECTION LOC  09/19/2021   JOINT REPLACEMENT     bilateral knee   LAMINECTOMY  11/14/2013    Cervical Fusion , [facility], Dr. Delilah Shan   LASER ABLATION Bilateral 07/29/2012   Dr. Lucky Cowboy   LOWER EXTREMITY ANGIOGRAPHY Right 11/02/2019   Procedure: Lower Extremity Angiography;  Surgeon: Katha Cabal, MD;  Location: [facility];  Service: Cardiovascular;  Laterality: Right;   ORIF ANKLE FRACTURE Right 09/02/2019   Procedure: OPEN REDUCTION INTERNAL FIXATION (ORIF) ANKLE FRACTURE BIMALLEOLAR;  Surgeon: [name], DPM;  Location: [facility] OR[facility];  Service: Podiatry;  Laterality: Right;   PERIPHERAL VASCULAR THROMBECTOMY Right 03/02/2020   Procedure: PERIPHERAL VASCULAR THROMBECTOMY;  Surgeon: Algernon Huxley, MD;  Location: [facility];  Service: Cardiovascular;  Laterality: Right;   REPLACEMENT TOTAL KNEE Left 03/2010   Dr. Rudene Christians   TEE WITHOUT CARDIOVERSION N/A 09/19/2021   Procedure: TRANSESOPHAGEAL ECHOCARDIOGRAM (TEE);  Surgeon: Corey Skains, MD;  Location: [facility];  Service: Cardiovascular;  Laterality: N/A;   TOTAL HIP ARTHROPLASTY Left 12/11/2015   Procedure: TOTAL HIP ARTHROPLASTY ANTERIOR APPROACH;  Surgeon: Hessie Knows, MD;  Location: [facility];  Service: Orthopedics;  Laterality: Left;   TOTAL KNEE ARTHROPLASTY Right 03/11/2016   Procedure: TOTAL KNEE ARTHROPLASTY;  Surgeon: Hessie Knows, MD;  Location: [facility];  Service: Orthopedics;  Laterality: Right;    Family History  Problem Relation Age of Onset   Diabetes Mother    Hyperlipidemia Mother    Hypertension Mother    Diabetes Father    Hyperlipidemia Father    Hypertension Father    Obesity Father    Hypertension Sister    Hyperlipidemia Sister    Hyperlipidemia Sister    Breast cancer Neg Hx     Social History   Tobacco Use   Smoking status: Never   Smokeless tobacco: Never  Substance Use Topics   Alcohol use: No    Alcohol/week: 0.0 standard drinks     Current Outpatient Medications:    acetaminophen (TYLENOL) 650 MG CR tablet, Take 650-1,300 mg by mouth 2 (two) times daily as needed for pain., Disp: , Rfl:    amiodarone (PACERONE) 200 MG tablet, Take 200 mg by mouth 2 (two) times daily., Disp: , Rfl:    amoxicillin (AMOXIL) 500 MG capsule, Take 1 capsule (500 mg total) by mouth 3 (three) times daily. Chronic  suppressive therapy to start 10/27/2021 after completion of IV ceftriaxone on 10/26/2021.  To remain on amoxicillin suppressive therapy indefinitely until infectious disease says may stop, Disp: 90 capsule, Rfl: 0   apixaban (ELIQUIS) 5 MG TABS tablet, Take 1 tablet by mouth 2 (two) times daily., Disp: , Rfl:    ASPIRIN LOW DOSE 81 MG EC tablet, TAKE 1 TABLET BY MOUTH EVERY DAY, Disp: 90 tablet, Rfl: 0   atorvastatin (LIPITOR) 40 MG tablet, Take 1 tablet (40 mg total) by mouth every evening., Disp: 90 tablet, Rfl: 1   blood glucose meter kit and supplies, 1 each by Other route daily at 12 noon. Dispense based on patient and insurance preference. Check fsbs once a day, Disp: 1 each, Rfl: 0   Calcium Carbonate-Vitamin D 600-400 MG-UNIT tablet, Take 1 tablet by mouth 2 (two) times daily., Disp: 60 tablet, Rfl: 0   escitalopram (LEXAPRO) 5 MG tablet, TAKE 1 TABLET BY MOUTH DAILY FOR ANXIETY. WAIT UNTIL FOLLOW UP WITH  DR. Clayborn Bigness TO START THIS MED, Disp: 90 tablet, Rfl: 0   fluticasone (FLONASE) 50 MCG/ACT nasal spray, SPRAY 2 SPRAYS INTO EACH NOSTRIL EVERY DAY, Disp: 48 mL, Rfl: 0   latanoprost (XALATAN) 0.005 % ophthalmic solution, SMARTSIG:In Eye(s), Disp: , Rfl:    levothyroxine (SYNTHROID) 25 MCG tablet, Take 1 tablet (25 mcg total) by mouth daily at 6 (six) AM., Disp: 90 tablet, Rfl: 1   Magnesium Oxide 500 MG CAPS, Take 500 mg by mouth daily. , Disp: , Rfl:    metoprolol succinate (TOPROL-XL) 50 MG 24 hr tablet, Take 50 mg by mouth daily., Disp: , Rfl:    mirabegron ER (MYRBETRIQ) 50 MG TB24 tablet, Take 1 tablet (50 mg total) by mouth daily., Disp: 90 tablet, Rfl: 3   Oxycodone HCl 10 MG TABS, Take 1 tablet (10 mg total) by mouth every 6 (six) hours as needed., Disp: 10 tablet, Rfl: 0   polyethylene glycol (MIRALAX / GLYCOLAX) 17 g packet, Take 17 g by mouth daily., Disp: 14 each, Rfl: 0   potassium chloride (KLOR-CON) 10 MEQ tablet, Take 10 mEq by mouth daily., Disp: , Rfl:    SIMBRINZA 1-0.2 %  SUSP, Place 1 drop into both eyes 2 (two) times daily., Disp: , Rfl:    sodium chloride (OCEAN) 0.65 % SOLN nasal spray, Place 1 spray into both nostrils 3 (three) times daily., Disp: 30 mL, Rfl: 0   torsemide (DEMADEX) 20 MG tablet, TAKE 1 TABLET BY MOUTH EVERY DAY, Disp: 90 tablet, Rfl: 1   traZODone (DESYREL) 50 MG tablet, Take 0.5-1 tablets (25-50 mg total) by mouth at bedtime as needed. for sleep, Disp: 90 tablet, Rfl: 0  Allergies  Allergen Reactions   Lisinopril Swelling    I personally reviewed active problem list, medication list, allergies, family history, social history, health maintenance with the patient/caregiver today.   ROS  Ten systems reviewed and is negative except as mentioned in HPI   Objective  Vitals:   10/28/21 1303  BP: 112/70  Pulse: 63  Resp: 16  SpO2: 96%  Weight: 298 lb (135.2 kg)  Height: 5' 11"  (1.803 m)    Body mass index is 41.56 kg/m.  Physical Exam  Constitutional: Patient appears well-developed and well-nourished. Obese  No distress.  HEENT: head atraumatic, normocephalic, pupils equal and reactive to light, neck supple Cardiovascular: Normal rate, regular rhythm and normal heart sounds.  No murmur heard. 3 plus pitting BLE edema. Pulmonary/Chest: Effort normal and breath sounds normal. No respiratory distress. Abdominal: Soft.  There is no tenderness. Psychiatric: Patient has a normal mood and affect. behavior is normal. Judgment and thought content normal.  Gait: sitting on wheelchair: she has difficulty lifting right leg from foot rest, able to lift left leg   Recent Results (from the past 2160 hour(s))  Comprehensive metabolic panel     Status: Abnormal   Collection Time: 09/14/21  4:57 PM  Result Value Ref Range   Sodium 135 135 - 145 mmol/L   Potassium 3.8 3.5 - 5.1 mmol/L   Chloride 99 98 - 111 mmol/L   CO2 26 22 - 32 mmol/L   Glucose, Bld 106 (H) 70 - 99 mg/dL    Comment: Glucose reference range applies only to samples  taken after fasting for at least 8 hours.   BUN 23 8 - 23 mg/dL   Creatinine, Ser 1.51 (H) 0.44 - 1.00 mg/dL   Calcium 9.1 8.9 - 10.3 mg/dL   Total Protein 8.2 (H) 6.5 -  8.1 g/dL   Albumin 3.7 3.5 - 5.0 g/dL   AST 29 15 - 41 U/L   ALT 20 0 - 44 U/L   Alkaline Phosphatase 66 38 - 126 U/L   Total Bilirubin 1.1 0.3 - 1.2 mg/dL   GFR, Estimated 36 (L) >60 mL/min    Comment: (NOTE) Calculated using the CKD-EPI Creatinine Equation (2021)    Anion gap 10 5 - 15    Comment: Performed at [facility], [place], [place] [address]  Troponin I (High Sensitivity)     Status: None   Collection Time: 09/14/21  4:57 PM  Result Value Ref Range   Troponin I (High Sensitivity) 17 <18 ng/L    Comment: (NOTE) Elevated high sensitivity troponin I (hsTnI) values and significant  changes across serial measurements may suggest ACS but many other  chronic and acute conditions are known to elevate hsTnI results.  Refer to the "Links" section for chest pain algorithms and additional  guidance. Performed at [facility], [address] [place], [place] [address]   CBC with Differential     Status: Abnormal   Collection Time: 09/14/21  4:57 PM  Result Value Ref Range   WBC 8.5 4.0 - 10.5 K/uL   RBC 3.80 (L) 3.87 - 5.11 MIL/uL   Hemoglobin 12.0 12.0 - 15.0 g/dL   HCT 37.3 36.0 - 46.0 %   MCV 98.2 80.0 - 100.0 fL   MCH 31.6 26.0 - 34.0 pg   MCHC 32.2 30.0 - 36.0 g/dL   RDW 18.0 (H) 11.5 - 15.5 %   Platelets 137 (L) 150 - 400 K/uL   nRBC 0.0 0.0 - 0.2 %   Neutrophils Relative % 89 %   Neutro Abs 7.6 1.7 - 7.7 K/uL   Lymphocytes Relative 6 %   Lymphs Abs 0.5 (L) 0.7 - 4.0 K/uL   Monocytes Relative 5 %   Monocytes Absolute 0.4 0.1 - 1.0 K/uL   Eosinophils Relative 0 %   Eosinophils Absolute 0.0 0.0 - 0.5 K/uL   Basophils Relative 0 %   Basophils Absolute 0.0 0.0 - 0.1 K/uL   Immature Granulocytes 0 %   Abs Immature Granulocytes 0.03 0.00 - 0.07 K/uL    Comment:  Performed at [facility], [address] [place], [place] [address]  Lactic acid, plasma     Status: None   Collection Time: 09/14/21  4:57 PM  Result Value Ref Range   Lactic Acid, Venous 1.6 0.5 - 1.9 mmol/L    Comment: Performed at [facility], [address] [place], [place] [address]  Resp Panel by RT-PCR (Flu A&B, Covid) Nasopharyngeal Swab     Status: None   Collection Time: 09/14/21  4:57 PM   Specimen: Nasopharyngeal Swab; Nasopharyngeal(NP) swabs in vial transport medium  Result Value Ref Range   SARS Coronavirus 2 by RT PCR NEGATIVE NEGATIVE    Comment: (NOTE) SARS-CoV-2 target nucleic acids are NOT DETECTED.  The SARS-CoV-2 RNA is generally detectable in upper respiratory specimens during the acute phase of infection. The lowest concentration of SARS-CoV-2 viral copies this assay can detect is 138 copies/mL. A negative result does not preclude SARS-Cov-2 infection and should not be used as the sole basis for treatment or other patient management decisions. A negative result may occur with  improper specimen collection/handling, submission of specimen other than nasopharyngeal swab, presence of viral mutation(s) within the areas targeted by this assay, and inadequate number of viral  copies(<138 copies/mL). A negative result must be combined with clinical observations, patient history, and epidemiological information. The expected result is Negative.  Fact Sheet for Patients:  EntrepreneurPulse.com.au  Fact Sheet for Healthcare Providers:  IncredibleEmployment.be  This test is no t yet approved or cleared by the [place] FDA and  has been authorized for detection and/or diagnosis of SARS-CoV-2 by FDA under an Emergency Use Authorization (EUA). This EUA will remain  in effect (meaning this test can be used) for the duration of the COVID-19 declaration under Section 564(b)(1) of the Act, 21 U.S.C.section  360bbb-3(b)(1), unless the authorization is terminated  or revoked sooner.       Influenza A by PCR NEGATIVE NEGATIVE   Influenza B by PCR NEGATIVE NEGATIVE    Comment: (NOTE) The Xpert Xpress SARS-CoV-2/FLU/RSV plus assay is intended as an aid in the diagnosis of influenza from Nasopharyngeal swab specimens and should not be used as a sole basis for treatment. Nasal washings and aspirates are unacceptable for Xpert Xpress SARS-CoV-2/FLU/RSV testing.  Fact Sheet for Patients: EntrepreneurPulse.com.au  Fact Sheet for Healthcare Providers: IncredibleEmployment.be  This test is not yet approved or cleared by the [place] FDA and has been authorized for detection and/or diagnosis of SARS-CoV-2 by FDA under an Emergency Use Authorization (EUA). This EUA will remain in effect (meaning this test can be used) for the duration of the COVID-19 declaration under Section 564(b)(1) of the Act, 21 U.S.C. section 360bbb-3(b)(1), unless the authorization is terminated or revoked.  Performed at [facility], [place], [place] [address]   Procalcitonin - Baseline     Status: None   Collection Time: 09/14/21  4:57 PM  Result Value Ref Range   Procalcitonin 5.38 ng/mL    Comment:        Interpretation: PCT > 2 ng/mL: Systemic infection (sepsis) is likely, unless other causes are known. (NOTE)       Sepsis PCT Algorithm           Lower Respiratory Tract                                      Infection PCT Algorithm    ----------------------------     ----------------------------         PCT < 0.25 ng/mL                PCT < 0.10 ng/mL          Strongly encourage             Strongly discourage   discontinuation of antibiotics    initiation of antibiotics    ----------------------------     -----------------------------       PCT 0.25 - 0.50 ng/mL            PCT 0.10 - 0.25 ng/mL               OR       >80% decrease in PCT             Discourage initiation of                                            antibiotics      Encourage discontinuation  of antibiotics    ----------------------------     -----------------------------         PCT >= 0.50 ng/mL              PCT 0.26 - 0.50 ng/mL               AND       <80% decrease in PCT              Encourage initiation of                                             antibiotics       Encourage continuation           of antibiotics    ----------------------------     -----------------------------        PCT >= 0.50 ng/mL                  PCT > 0.50 ng/mL               AND         increase in PCT                  Strongly encourage                                      initiation of antibiotics    Strongly encourage escalation           of antibiotics                                     -----------------------------                                           PCT <= 0.25 ng/mL                                                 OR                                        > 80% decrease in PCT                                      Discontinue / Do not initiate                                             antibiotics  Performed at [facility], [address] [place], [place] [address]   Sedimentation rate     Status: None   Collection Time: 09/14/21  4:57 PM  Result Value Ref Range   Sed Rate 2 0 - 30 mm/hr  Comment: Performed at [facility], [address] [place], [place] [address]  Lactic acid, plasma     Status: None   Collection Time: 09/14/21  7:43 PM  Result Value Ref Range   Lactic Acid, Venous 1.6 0.5 - 1.9 mmol/L    Comment: Performed at [facility], [place], [place] [address]  Troponin I (High Sensitivity)     Status: Abnormal   Collection Time: 09/14/21  7:43 PM  Result Value Ref Range   Troponin I (High Sensitivity) 20 (H) <18 ng/L    Comment: (NOTE) Elevated high sensitivity troponin I (hsTnI)  values and significant  changes across serial measurements may suggest ACS but many other  chronic and acute conditions are known to elevate hsTnI results.  Refer to the "Links" section for chest pain algorithms and additional  guidance. Performed at [facility], [address] [place], [place] [address]   Urinalysis, Routine w reflex microscopic     Status: Abnormal   Collection Time: 09/14/21  8:40 PM  Result Value Ref Range   Color, Urine YELLOW (A) YELLOW   APPearance HAZY (A) CLEAR   Specific Gravity, Urine 1.024 1.005 - 1.030   pH 6.0 5.0 - 8.0   Glucose, UA NEGATIVE NEGATIVE mg/dL   Hgb urine dipstick MODERATE (A) NEGATIVE   Bilirubin Urine NEGATIVE NEGATIVE   Ketones, ur NEGATIVE NEGATIVE mg/dL   Protein, ur 100 (A) NEGATIVE mg/dL   Nitrite NEGATIVE NEGATIVE   Leukocytes,Ua NEGATIVE NEGATIVE   RBC / HPF 21-50 0 - 5 RBC/hpf   WBC, UA 0-5 0 - 5 WBC/hpf   Bacteria, UA RARE (A) NONE SEEN   Squamous Epithelial / LPF 0-5 0 - 5    Comment: Performed at [facility], [address] [place], [place] [address]  Culture, blood (Routine X 2) w Reflex to ID Panel     Status: Abnormal   Collection Time: 09/14/21 11:15 PM   Specimen: BLOOD  Result Value Ref Range   Specimen Description      BLOOD RIGHT ANTECUBITAL Performed at [facility], [address] [place], [place] [address]    Special Requests      BOTTLES DRAWN AEROBIC AND ANAEROBIC Blood Culture results may not be optimal due to an excessive volume of blood received in culture bottles Performed at [facility], [address] [place], [place] [address]    Culture  Setup Time      GRAM POSITIVE COCCI IN BOTH AEROBIC AND ANAEROBIC BOTTLES CRITICAL VALUE NOTED.  VALUE IS CONSISTENT WITH PREVIOUSLY REPORTED AND CALLED VALUE. Performed at [facility], [place], [place] [address]    Culture (A)     STREPTOCOCCUS AGALACTIAE SUSCEPTIBILITIES PERFORMED ON  PREVIOUS CULTURE WITHIN THE LAST 5 DAYS. STAPHYLOCOCCUS CAPITIS THE SIGNIFICANCE OF ISOLATING THIS ORGANISM FROM A SINGLE SET OF BLOOD CULTURES WHEN MULTIPLE SETS ARE DRAWN IS UNCERTAIN. PLEASE NOTIFY THE MICROBIOLOGY DEPARTMENT WITHIN ONE WEEK IF SPECIATION AND SENSITIVITIES ARE REQUIRED. Performed at [facility], [place], [place], [place] [address]    Report Status 09/18/2021 FINAL   Resp Panel by RT-PCR (Flu A&B, Covid) Nasopharyngeal Swab     Status: None   Collection Time: 09/14/21 11:15 PM   Specimen: Nasopharyngeal Swab; Nasopharyngeal(NP) swabs in vial transport medium  Result Value Ref Range   SARS Coronavirus 2 by RT PCR NEGATIVE NEGATIVE    Comment: (NOTE) SARS-CoV-2 target nucleic acids are NOT  DETECTED.  The SARS-CoV-2 RNA is generally detectable in upper respiratory specimens during the acute phase of infection. The lowest concentration of SARS-CoV-2 viral copies this assay can detect is 138 copies/mL. A negative result does not preclude SARS-Cov-2 infection and should not be used as the sole basis for treatment or other patient management decisions. A negative result may occur with  improper specimen collection/handling, submission of specimen other than nasopharyngeal swab, presence of viral mutation(s) within the areas targeted by this assay, and inadequate number of viral copies(<138 copies/mL). A negative result must be combined with clinical observations, patient history, and epidemiological information. The expected result is Negative.  Fact Sheet for Patients:  EntrepreneurPulse.com.au  Fact Sheet for Healthcare Providers:  IncredibleEmployment.be  This test is no t yet approved or cleared by the [place] FDA and  has been authorized for detection and/or diagnosis of SARS-CoV-2 by FDA under an Emergency Use Authorization (EUA). This EUA will remain  in effect (meaning this test can be used) for the  duration of the COVID-19 declaration under Section 564(b)(1) of the Act, 21 U.S.C.section 360bbb-3(b)(1), unless the authorization is terminated  or revoked sooner.       Influenza A by PCR NEGATIVE NEGATIVE   Influenza B by PCR NEGATIVE NEGATIVE    Comment: (NOTE) The Xpert Xpress SARS-CoV-2/FLU/RSV plus assay is intended as an aid in the diagnosis of influenza from Nasopharyngeal swab specimens and should not be used as a sole basis for treatment. Nasal washings and aspirates are unacceptable for Xpert Xpress SARS-CoV-2/FLU/RSV testing.  Fact Sheet for Patients: EntrepreneurPulse.com.au  Fact Sheet for Healthcare Providers: IncredibleEmployment.be  This test is not yet approved or cleared by the [place] FDA and has been authorized for detection and/or diagnosis of SARS-CoV-2 by FDA under an Emergency Use Authorization (EUA). This EUA will remain in effect (meaning this test can be used) for the duration of the COVID-19 declaration under Section 564(b)(1) of the Act, 21 U.S.C. section 360bbb-3(b)(1), unless the authorization is terminated or revoked.  Performed at [facility], [address] [place], [place] [address]   Culture, blood (Routine X 2) w Reflex to ID Panel     Status: Abnormal   Collection Time: 09/15/21  2:26 AM   Specimen: BLOOD  Result Value Ref Range   Specimen Description      BLOOD BLOOD RIGHT HAND Performed at [facility], [address], [place], [place] [address]    Special Requests      BOTTLES DRAWN AEROBIC AND ANAEROBIC Blood Culture adequate volume Performed at [facility], [address] [place], [place] [address]    Culture  Setup Time      Organism ID to follow GRAM POSITIVE COCCI IN BOTH AEROBIC AND ANAEROBIC BOTTLES CRITICAL RESULT CALLED TO, READ BACK BY AND VERIFIED WITHLloyd Huger PHARMD [id number] 09/16/21 HNM Performed at [facility], [address] [place], [place] [address]    Culture GROUP B STREP(S.AGALACTIAE)ISOLATED (A)    Report Status 09/18/2021 FINAL    Organism ID, Bacteria GROUP B STREP(S.AGALACTIAE)ISOLATED       Susceptibility   Group b strep(s.agalactiae)isolated - MIC*    CLINDAMYCIN >=1 RESISTANT Resistant     AMPICILLIN <=0.25 SENSITIVE Sensitive     ERYTHROMYCIN >=8 RESISTANT Resistant     VANCOMYCIN 0.5 SENSITIVE Sensitive     CEFTRIAXONE <=0.12 SENSITIVE Sensitive     LEVOFLOXACIN 1 SENSITIVE Sensitive     * GROUP B STREP(S.AGALACTIAE)ISOLATED  C-reactive protein  Status: Abnormal   Collection Time: 09/15/21  2:26 AM  Result Value Ref Range   CRP 36.1 (H) <1.0 mg/dL    Comment: Performed at [place] [address], [place], [place] [address]  Blood Culture ID Panel (Reflexed)     Status: Abnormal   Collection Time: 09/15/21  2:26 AM  Result Value Ref Range   Enterococcus faecalis NOT DETECTED NOT DETECTED   Enterococcus Faecium NOT DETECTED NOT DETECTED   Listeria monocytogenes NOT DETECTED NOT DETECTED   Staphylococcus species NOT DETECTED NOT DETECTED   Staphylococcus aureus (BCID) NOT DETECTED NOT DETECTED   Staphylococcus epidermidis NOT DETECTED NOT DETECTED   Staphylococcus lugdunensis NOT DETECTED NOT DETECTED   Streptococcus species DETECTED (A) NOT DETECTED    Comment: CRITICAL RESULT CALLED TO, READ BACK BY AND VERIFIED WITHLloyd Huger PHARMD [id number] 09/16/21 HNM    Streptococcus agalactiae DETECTED (A) NOT DETECTED    Comment: CRITICAL RESULT CALLED TO, READ BACK BY AND VERIFIED WITH: Lloyd Huger PHARMD [id number] 09/16/21 HNM    Streptococcus pneumoniae NOT DETECTED NOT DETECTED   Streptococcus pyogenes NOT DETECTED NOT DETECTED   A.calcoaceticus-baumannii NOT DETECTED NOT DETECTED   Bacteroides fragilis NOT DETECTED NOT DETECTED   Enterobacterales NOT DETECTED NOT DETECTED   Enterobacter cloacae complex NOT DETECTED NOT DETECTED   Escherichia coli NOT DETECTED NOT DETECTED    Klebsiella aerogenes NOT DETECTED NOT DETECTED   Klebsiella oxytoca NOT DETECTED NOT DETECTED   Klebsiella pneumoniae NOT DETECTED NOT DETECTED   Proteus species NOT DETECTED NOT DETECTED   Salmonella species NOT DETECTED NOT DETECTED   Serratia marcescens NOT DETECTED NOT DETECTED   Haemophilus influenzae NOT DETECTED NOT DETECTED   Neisseria meningitidis NOT DETECTED NOT DETECTED   Pseudomonas aeruginosa NOT DETECTED NOT DETECTED   Stenotrophomonas maltophilia NOT DETECTED NOT DETECTED   Candida albicans NOT DETECTED NOT DETECTED   Candida auris NOT DETECTED NOT DETECTED   Candida glabrata NOT DETECTED NOT DETECTED   Candida krusei NOT DETECTED NOT DETECTED   Candida parapsilosis NOT DETECTED NOT DETECTED   Candida tropicalis NOT DETECTED NOT DETECTED   Cryptococcus neoformans/gattii NOT DETECTED NOT DETECTED    Comment: Performed at [facility], [address] [place], [place] [address]  Comprehensive metabolic panel     Status: Abnormal   Collection Time: 09/15/21  5:33 AM  Result Value Ref Range   Sodium 132 (L) 135 - 145 mmol/L   Potassium 3.7 3.5 - 5.1 mmol/L   Chloride 101 98 - 111 mmol/L   CO2 22 22 - 32 mmol/L   Glucose, Bld 100 (H) 70 - 99 mg/dL    Comment: Glucose reference range applies only to samples taken after fasting for at least 8 hours.   BUN 22 8 - 23 mg/dL   Creatinine, Ser 1.31 (H) 0.44 - 1.00 mg/dL   Calcium 8.8 (L) 8.9 - 10.3 mg/dL   Total Protein 7.5 6.5 - 8.1 g/dL   Albumin 3.2 (L) 3.5 - 5.0 g/dL   AST 21 15 - 41 U/L   ALT 18 0 - 44 U/L   Alkaline Phosphatase 60 38 - 126 U/L   Total Bilirubin 1.1 0.3 - 1.2 mg/dL   GFR, Estimated 42 (L) >60 mL/min    Comment: (NOTE) Calculated using the CKD-EPI Creatinine Equation (2021)    Anion gap 9 5 - 15    Comment: Performed at [facility], [address] [place], [place] [address]  Magnesium  Status: None   Collection Time: 09/15/21  5:33 AM  Result Value Ref Range   Magnesium  2.1 1.7 - 2.4 mg/dL    Comment: Performed at [facility], [address] [place], [place] [address]  CBC WITH DIFFERENTIAL     Status: Abnormal   Collection Time: 09/15/21  5:33 AM  Result Value Ref Range   WBC 8.6 4.0 - 10.5 K/uL   RBC 3.60 (L) 3.87 - 5.11 MIL/uL   Hemoglobin 11.3 (L) 12.0 - 15.0 g/dL   HCT 34.6 (L) 36.0 - 46.0 %   MCV 96.1 80.0 - 100.0 fL   MCH 31.4 26.0 - 34.0 pg   MCHC 32.7 30.0 - 36.0 g/dL   RDW 17.9 (H) 11.5 - 15.5 %   Platelets 126 (L) 150 - 400 K/uL   nRBC 0.0 0.0 - 0.2 %   Neutrophils Relative % 85 %   Neutro Abs 7.3 1.7 - 7.7 K/uL   Lymphocytes Relative 6 %   Lymphs Abs 0.6 (L) 0.7 - 4.0 K/uL   Monocytes Relative 8 %   Monocytes Absolute 0.7 0.1 - 1.0 K/uL   Eosinophils Relative 0 %   Eosinophils Absolute 0.0 0.0 - 0.5 K/uL   Basophils Relative 0 %   Basophils Absolute 0.0 0.0 - 0.1 K/uL   Immature Granulocytes 1 %   Abs Immature Granulocytes 0.04 0.00 - 0.07 K/uL    Comment: Performed at [facility], [address] [place], [place] [address]  Glucose, capillary     Status: None   Collection Time: 09/15/21  8:08 AM  Result Value Ref Range   Glucose-Capillary 96 70 - 99 mg/dL    Comment: Glucose reference range applies only to samples taken after fasting for at least 8 hours.  Glucose, capillary     Status: None   Collection Time: 09/15/21 12:18 PM  Result Value Ref Range   Glucose-Capillary 99 70 - 99 mg/dL    Comment: Glucose reference range applies only to samples taken after fasting for at least 8 hours.  Glucose, capillary     Status: None   Collection Time: 09/15/21  5:23 PM  Result Value Ref Range   Glucose-Capillary 96 70 - 99 mg/dL    Comment: Glucose reference range applies only to samples taken after fasting for at least 8 hours.  Glucose, capillary     Status: Abnormal   Collection Time: 09/15/21  9:56 PM  Result Value Ref Range   Glucose-Capillary 114 (H) 70 - 99 mg/dL    Comment: Glucose reference range  applies only to samples taken after fasting for at least 8 hours.  Lactic acid, plasma     Status: None   Collection Time: 09/15/21 10:11 PM  Result Value Ref Range   Lactic Acid, Venous 1.3 0.5 - 1.9 mmol/L    Comment: Performed at [facility], [address] [place], [place] [address]  Lactic acid, plasma     Status: None   Collection Time: 09/16/21  1:37 AM  Result Value Ref Range   Lactic Acid, Venous 1.4 0.5 - 1.9 mmol/L    Comment: Performed at [facility], [address] [place], [place] [address]  CBC     Status: Abnormal   Collection Time: 09/16/21  3:12 AM  Result Value Ref Range   WBC 9.0 4.0 - 10.5 K/uL   RBC 3.34 (L) 3.87 - 5.11 MIL/uL   Hemoglobin 10.3 (L) 12.0 - 15.0 g/dL  HCT 33.0 (L) 36.0 - 46.0 %   MCV 98.8 80.0 - 100.0 fL   MCH 30.8 26.0 - 34.0 pg   MCHC 31.2 30.0 - 36.0 g/dL   RDW 18.4 (H) 11.5 - 15.5 %   Platelets 121 (L) 150 - 400 K/uL   nRBC 0.0 0.0 - 0.2 %    Comment: Performed at [facility], [address] [place], [place] [address]  Basic metabolic panel     Status: Abnormal   Collection Time: 09/16/21  3:12 AM  Result Value Ref Range   Sodium 135 135 - 145 mmol/L   Potassium 4.1 3.5 - 5.1 mmol/L   Chloride 103 98 - 111 mmol/L   CO2 24 22 - 32 mmol/L   Glucose, Bld 84 70 - 99 mg/dL    Comment: Glucose reference range applies only to samples taken after fasting for at least 8 hours.   BUN 23 8 - 23 mg/dL   Creatinine, Ser 1.39 (H) 0.44 - 1.00 mg/dL   Calcium 8.7 (L) 8.9 - 10.3 mg/dL   GFR, Estimated 40 (L) >60 mL/min    Comment: (NOTE) Calculated using the CKD-EPI Creatinine Equation (2021)    Anion gap 8 5 - 15    Comment: Performed at [facility], [address] [place], [place] [address]  Hemoglobin A1c     Status: None   Collection Time: 09/16/21  3:12 AM  Result Value Ref Range   Hgb A1c MFr Bld 5.4 4.8 - 5.6 %    Comment: (NOTE) Pre diabetes:          5.7%-6.4%  Diabetes:               >6.4%  Glycemic control for   <7.0% adults with diabetes    Mean Plasma Glucose 108.28 mg/dL    Comment: Performed at [facility] [address], [place], [place] [address]  Glucose, capillary     Status: None   Collection Time: 09/16/21  7:54 AM  Result Value Ref Range   Glucose-Capillary 76 70 - 99 mg/dL    Comment: Glucose reference range applies only to samples taken after fasting for at least 8 hours.  Glucose, capillary     Status: None   Collection Time: 09/16/21 12:35 PM  Result Value Ref Range   Glucose-Capillary 96 70 - 99 mg/dL    Comment: Glucose reference range applies only to samples taken after fasting for at least 8 hours.  Glucose, capillary     Status: Abnormal   Collection Time: 09/16/21  4:07 PM  Result Value Ref Range   Glucose-Capillary 107 (H) 70 - 99 mg/dL    Comment: Glucose reference range applies only to samples taken after fasting for at least 8 hours.  Glucose, capillary     Status: Abnormal   Collection Time: 09/16/21  9:15 PM  Result Value Ref Range   Glucose-Capillary 102 (H) 70 - 99 mg/dL    Comment: Glucose reference range applies only to samples taken after fasting for at least 8 hours.  CBC     Status: Abnormal   Collection Time: 09/17/21  5:05 AM  Result Value Ref Range   WBC 8.3 4.0 - 10.5 K/uL   RBC 3.29 (L) 3.87 - 5.11 MIL/uL   Hemoglobin 10.5 (L) 12.0 - 15.0 g/dL   HCT 31.6 (L) 36.0 - 46.0 %   MCV 96.0 80.0 - 100.0 fL   MCH 31.9 26.0 - 34.0 pg  MCHC 33.2 30.0 - 36.0 g/dL   RDW 18.0 (H) 11.5 - 15.5 %   Platelets 145 (L) 150 - 400 K/uL   nRBC 0.0 0.0 - 0.2 %    Comment: Performed at [facility], [address] [place], [place] [address]  Basic metabolic panel     Status: Abnormal   Collection Time: 09/17/21  5:05 AM  Result Value Ref Range   Sodium 134 (L) 135 - 145 mmol/L   Potassium 3.6 3.5 - 5.1 mmol/L   Chloride 103 98 - 111 mmol/L   CO2 23 22 - 32 mmol/L   Glucose, Bld 102 (H) 70 - 99 mg/dL    Comment:  Glucose reference range applies only to samples taken after fasting for at least 8 hours.   BUN 23 8 - 23 mg/dL   Creatinine, Ser 1.28 (H) 0.44 - 1.00 mg/dL   Calcium 8.5 (L) 8.9 - 10.3 mg/dL   GFR, Estimated 44 (L) >60 mL/min    Comment: (NOTE) Calculated using the CKD-EPI Creatinine Equation (2021)    Anion gap 8 5 - 15    Comment: Performed at [facility], [address] [place], [place] [address]  Culture, blood (Routine X 2) w Reflex to ID Panel     Status: None   Collection Time: 09/17/21  5:05 AM   Specimen: BLOOD  Result Value Ref Range   Specimen Description BLOOD BLOOD RIGHT HAND    Special Requests      IN PEDIATRIC BOTTLE Blood Culture results may not be optimal due to an excessive volume of blood received in culture bottles   Culture      NO GROWTH 5 DAYS Performed at [facility], [address] [place],  [address]    Report Status 09/22/2021 FINAL   Glucose, capillary     Status: Abnormal   Collection Time: 09/17/21  7:27 AM  Result Value Ref Range   Glucose-Capillary 114 (H) 70 - 99 mg/dL    Comment: Glucose reference range applies only to samples taken after fasting for at least 8 hours.  Glucose, capillary     Status: Abnormal   Collection Time: 09/17/21 11:30 AM  Result Value Ref Range   Glucose-Capillary 110 (H) 70 - 99 mg/dL    Comment: Glucose reference range applies only to samples taken after fasting for at least 8 hours.  Glucose, capillary     Status: Abnormal   Collection Time: 09/17/21  3:10 PM  Result Value Ref Range   Glucose-Capillary 122 (H) 70 - 99 mg/dL    Comment: Glucose reference range applies only to samples taken after fasting for at least 8 hours.  Glucose, capillary     Status: Abnormal   Collection Time: 09/17/21  5:00 PM  Result Value Ref Range   Glucose-Capillary 135 (H) 70 - 99 mg/dL    Comment: Glucose reference range applies only to samples taken after fasting for at least 8 hours.  Glucose,  capillary     Status: Abnormal   Collection Time: 09/17/21  9:06 PM  Result Value Ref Range   Glucose-Capillary 128 (H) 70 - 99 mg/dL    Comment: Glucose reference range applies only to samples taken after fasting for at least 8 hours.  CBC     Status: Abnormal   Collection Time: 09/18/21  4:34 AM  Result Value Ref Range   WBC 9.7 4.0 - 10.5 K/uL   RBC 3.04 (L) 3.87 - 5.11 MIL/uL  Hemoglobin 9.4 (L) 12.0 - 15.0 g/dL   HCT 29.1 (L) 36.0 - 46.0 %   MCV 95.7 80.0 - 100.0 fL   MCH 30.9 26.0 - 34.0 pg   MCHC 32.3 30.0 - 36.0 g/dL   RDW 18.4 (H) 11.5 - 15.5 %   Platelets 157 150 - 400 K/uL   nRBC 0.0 0.0 - 0.2 %    Comment: Performed at [facility], [address], [place], [place] [address]  Basic metabolic panel     Status: Abnormal   Collection Time: 09/18/21  4:34 AM  Result Value Ref Range   Sodium 134 (L) 135 - 145 mmol/L   Potassium 3.7 3.5 - 5.1 mmol/L   Chloride 102 98 - 111 mmol/L   CO2 26 22 - 32 mmol/L   Glucose, Bld 102 (H) 70 - 99 mg/dL    Comment: Glucose reference range applies only to samples taken after fasting for at least 8 hours.   BUN 21 8 - 23 mg/dL   Creatinine, Ser 1.28 (H) 0.44 - 1.00 mg/dL   Calcium 8.6 (L) 8.9 - 10.3 mg/dL   GFR, Estimated 44 (L) >60 mL/min    Comment: (NOTE) Calculated using the CKD-EPI Creatinine Equation (2021)    Anion gap 6 5 - 15    Comment: Performed at [facility], [address] [place], [place] [address]  Glucose, capillary     Status: Abnormal   Collection Time: 09/18/21  7:31 AM  Result Value Ref Range   Glucose-Capillary 116 (H) 70 - 99 mg/dL    Comment: Glucose reference range applies only to samples taken after fasting for at least 8 hours.  Glucose, capillary     Status: Abnormal   Collection Time: 09/18/21 12:14 PM  Result Value Ref Range   Glucose-Capillary 109 (H) 70 - 99 mg/dL    Comment: Glucose reference range applies only to samples taken after fasting for at least 8 hours.  Glucose,  capillary     Status: Abnormal   Collection Time: 09/18/21  4:40 PM  Result Value Ref Range   Glucose-Capillary 104 (H) 70 - 99 mg/dL    Comment: Glucose reference range applies only to samples taken after fasting for at least 8 hours.  Glucose, capillary     Status: Abnormal   Collection Time: 09/18/21  8:49 PM  Result Value Ref Range   Glucose-Capillary 119 (H) 70 - 99 mg/dL    Comment: Glucose reference range applies only to samples taken after fasting for at least 8 hours.  CBC     Status: Abnormal   Collection Time: 09/19/21  4:47 AM  Result Value Ref Range   WBC 9.2 4.0 - 10.5 K/uL   RBC 2.93 (L) 3.87 - 5.11 MIL/uL   Hemoglobin 8.9 (L) 12.0 - 15.0 g/dL   HCT 27.8 (L) 36.0 - 46.0 %   MCV 94.9 80.0 - 100.0 fL   MCH 30.4 26.0 - 34.0 pg   MCHC 32.0 30.0 - 36.0 g/dL   RDW 18.2 (H) 11.5 - 15.5 %   Platelets 199 150 - 400 K/uL   nRBC 0.3 (H) 0.0 - 0.2 %    Comment: Performed at [facility], [address] [place], [place] [address]  Basic metabolic panel     Status: Abnormal   Collection Time: 09/19/21  4:47 AM  Result Value Ref Range   Sodium 134 (L) 135 - 145 mmol/L   Potassium 3.8 3.5 - 5.1 mmol/L  Chloride 99 98 - 111 mmol/L   CO2 25 22 - 32 mmol/L   Glucose, Bld 91 70 - 99 mg/dL    Comment: Glucose reference range applies only to samples taken after fasting for at least 8 hours.   BUN 20 8 - 23 mg/dL   Creatinine, Ser 1.06 (H) 0.44 - 1.00 mg/dL   Calcium 8.5 (L) 8.9 - 10.3 mg/dL   GFR, Estimated 55 (L) >60 mL/min    Comment: (NOTE) Calculated using the CKD-EPI Creatinine Equation (2021)    Anion gap 10 5 - 15    Comment: Performed at [facility], [address] [place], [place] [address]  Glucose, capillary     Status: None   Collection Time: 09/19/21  7:25 AM  Result Value Ref Range   Glucose-Capillary 97 70 - 99 mg/dL    Comment: Glucose reference range applies only to samples taken after fasting for at least 8 hours.  APTT     Status:  None   Collection Time: 09/19/21  8:21 AM  Result Value Ref Range   aPTT 35 24 - 36 seconds    Comment: Performed at [facility], [address] [place], [place] [address]  Protime-INR     Status: Abnormal   Collection Time: 09/19/21  8:21 AM  Result Value Ref Range   Prothrombin Time 20.8 (H) 11.4 - 15.2 seconds   INR 1.8 (H) 0.8 - 1.2    Comment: (NOTE) INR goal varies based on device and disease states. Performed at [facility], [address] [place], [place] [address]   Aerobic/Anaerobic Culture w Gram Stain (surgical/deep wound)     Status: None   Collection Time: 09/19/21 10:17 AM   Specimen: Abscess; Synovial Fluid  Result Value Ref Range   Specimen Description      ABSCESS Performed at [facility], [address] [place], [place] [address]    Special Requests      NONE Performed at [facility], [place], [place] [address]    Gram Stain      FEW WBC PRESENT,BOTH PMN AND MONONUCLEAR NO ORGANISMS SEEN    Culture      No growth aerobically or anaerobically. Performed at [facility], [place] [address], [place], [place] [address]    Report Status 09/24/2021 FINAL   Synovial cell count + diff, w/ crystals     Status: Abnormal   Collection Time: 09/19/21 10:17 AM  Result Value Ref Range   Color, Synovial RED (A) YELLOW    Comment: Performed at [facility]   Appearance-Synovial CLOUDY (A) CLEAR   Crystals, Fluid NO CRYSTALS SEEN    WBC, Synovial 4,000 (H) 0 - 200 /cu mm   Neutrophil, Synovial 95 %   Lymphocytes-Synovial Fld 5 %   Monocyte-Macrophage-Synovial Fluid 0 %   Eosinophils-Synovial 0 %   Other Cells-SYN RARE LINING CELL     Comment: Performed at [facility] [address], [place], [place] [address]  ECHOCARDIOGRAM COMPLETE     Status: None   Collection Time: 09/19/21 11:47 AM  Result Value Ref Range   Weight 4,800 oz   Height 71 in   BP 103/55 mmHg   Ao pk vel 1.55 m/s    AV Area VTI 3.04 cm2   AR max vel 3.27 cm2   AV Mean grad 5.5 mmHg   AV Peak grad 9.5 mmHg   S' Lateral 2.40 cm  AV Area mean vel 3.08 cm2   Area-P 1/2 3.36 cm2   MV VTI 3.48 cm2  Glucose, capillary     Status: None   Collection Time: 09/19/21 12:14 PM  Result Value Ref Range   Glucose-Capillary 76 70 - 99 mg/dL    Comment: Glucose reference range applies only to samples taken after fasting for at least 8 hours.  Glucose, capillary     Status: None   Collection Time: 09/19/21  2:30 PM  Result Value Ref Range   Glucose-Capillary 89 70 - 99 mg/dL    Comment: Glucose reference range applies only to samples taken after fasting for at least 8 hours.  Glucose, capillary     Status: None   Collection Time: 09/19/21  5:12 PM  Result Value Ref Range   Glucose-Capillary 98 70 - 99 mg/dL    Comment: Glucose reference range applies only to samples taken after fasting for at least 8 hours.  Glucose, capillary     Status: None   Collection Time: 09/19/21  9:08 PM  Result Value Ref Range   Glucose-Capillary 92 70 - 99 mg/dL    Comment: Glucose reference range applies only to samples taken after fasting for at least 8 hours.  APTT     Status: Abnormal   Collection Time: 09/19/21  9:46 PM  Result Value Ref Range   aPTT 77 (H) 24 - 36 seconds    Comment:        IF BASELINE aPTT IS ELEVATED, SUGGEST PATIENT RISK ASSESSMENT BE USED TO DETERMINE APPROPRIATE ANTICOAGULANT THERAPY. Performed at [facility], [address] [place], [place] [address]   CBC     Status: Abnormal   Collection Time: 09/20/21  6:39 AM  Result Value Ref Range   WBC 10.8 (H) 4.0 - 10.5 K/uL   RBC 2.67 (L) 3.87 - 5.11 MIL/uL   Hemoglobin 8.5 (L) 12.0 - 15.0 g/dL   HCT 25.5 (L) 36.0 - 46.0 %   MCV 95.5 80.0 - 100.0 fL   MCH 31.8 26.0 - 34.0 pg   MCHC 33.3 30.0 - 36.0 g/dL   RDW 18.3 (H) 11.5 - 15.5 %   Platelets 228 150 - 400 K/uL   nRBC 0.8 (H) 0.0 - 0.2 %    Comment: Performed at [facility], [address], [place], [place] [address]  Basic metabolic panel     Status: Abnormal   Collection Time: 09/20/21  6:39 AM  Result Value Ref Range   Sodium 134 (L) 135 - 145 mmol/L   Potassium 3.7 3.5 - 5.1 mmol/L   Chloride 101 98 - 111 mmol/L   CO2 26 22 - 32 mmol/L   Glucose, Bld 94 70 - 99 mg/dL    Comment: Glucose reference range applies only to samples taken after fasting for at least 8 hours.   BUN 16 8 - 23 mg/dL   Creatinine, Ser 0.99 0.44 - 1.00 mg/dL   Calcium 8.2 (L) 8.9 - 10.3 mg/dL   GFR, Estimated 59 (L) >60 mL/min    Comment: (NOTE) Calculated using the CKD-EPI Creatinine Equation (2021)    Anion gap 7 5 - 15    Comment: Performed at [facility], [address] [place], [place] [address]  APTT     Status: Abnormal   Collection Time: 09/20/21  6:39 AM  Result Value Ref Range   aPTT 77 (H) 24 - 36 seconds    Comment:  IF BASELINE aPTT IS ELEVATED, SUGGEST PATIENT RISK ASSESSMENT BE USED TO DETERMINE APPROPRIATE ANTICOAGULANT THERAPY. Performed at [facility], [place], [place] [address]   Heparin level (unfractionated)     Status: Abnormal   Collection Time: 09/20/21  6:39 AM  Result Value Ref Range   Heparin Unfractionated >1.10 (H) 0.30 - 0.70 IU/mL    Comment: (NOTE) The clinical reportable range upper limit is being lowered to >1.10 to align with the FDA approved guidance for the current laboratory assay.  If heparin results are below expected values, and patient dosage has  been confirmed, suggest follow up testing of antithrombin III levels. Performed at [facility], [address] [place], [place] [address]   Glucose, capillary     Status: None   Collection Time: 09/20/21  7:34 AM  Result Value Ref Range   Glucose-Capillary 84 70 - 99 mg/dL    Comment: Glucose reference range applies only to samples taken after fasting for at least 8 hours.  Glucose, capillary     Status: None    Collection Time: 09/20/21 11:37 AM  Result Value Ref Range   Glucose-Capillary 85 70 - 99 mg/dL    Comment: Glucose reference range applies only to samples taken after fasting for at least 8 hours.  Glucose, capillary     Status: None   Collection Time: 09/20/21  3:39 PM  Result Value Ref Range   Glucose-Capillary 94 70 - 99 mg/dL    Comment: Glucose reference range applies only to samples taken after fasting for at least 8 hours.  Glucose, capillary     Status: Abnormal   Collection Time: 09/20/21 10:31 PM  Result Value Ref Range   Glucose-Capillary 104 (H) 70 - 99 mg/dL    Comment: Glucose reference range applies only to samples taken after fasting for at least 8 hours.  Glucose, capillary     Status: None   Collection Time: 09/21/21  7:43 AM  Result Value Ref Range   Glucose-Capillary 89 70 - 99 mg/dL    Comment: Glucose reference range applies only to samples taken after fasting for at least 8 hours.  Glucose, capillary     Status: None   Collection Time: 09/21/21 11:21 AM  Result Value Ref Range   Glucose-Capillary 91 70 - 99 mg/dL    Comment: Glucose reference range applies only to samples taken after fasting for at least 8 hours.  Glucose, capillary     Status: Abnormal   Collection Time: 09/21/21  4:31 PM  Result Value Ref Range   Glucose-Capillary 111 (H) 70 - 99 mg/dL    Comment: Glucose reference range applies only to samples taken after fasting for at least 8 hours.  Glucose, capillary     Status: Abnormal   Collection Time: 09/21/21 10:05 PM  Result Value Ref Range   Glucose-Capillary 106 (H) 70 - 99 mg/dL    Comment: Glucose reference range applies only to samples taken after fasting for at least 8 hours.  Glucose, capillary     Status: Abnormal   Collection Time: 09/22/21  7:54 AM  Result Value Ref Range   Glucose-Capillary 107 (H) 70 - 99 mg/dL    Comment: Glucose reference range applies only to samples taken after fasting for at least 8 hours.  Glucose,  capillary     Status: None   Collection Time: 09/22/21 11:07 AM  Result Value Ref Range   Glucose-Capillary 92 70 - 99 mg/dL  Comment: Glucose reference range applies only to samples taken after fasting for at least 8 hours.  Glucose, capillary     Status: None   Collection Time: 09/22/21  4:48 PM  Result Value Ref Range   Glucose-Capillary 92 70 - 99 mg/dL    Comment: Glucose reference range applies only to samples taken after fasting for at least 8 hours.   Comment 1 Notify RN    Comment 2 Document in Chart   Glucose, capillary     Status: None   Collection Time: 09/22/21  9:20 PM  Result Value Ref Range   Glucose-Capillary 98 70 - 99 mg/dL    Comment: Glucose reference range applies only to samples taken after fasting for at least 8 hours.  Glucose, capillary     Status: None   Collection Time: 09/23/21  7:52 AM  Result Value Ref Range   Glucose-Capillary 79 70 - 99 mg/dL    Comment: Glucose reference range applies only to samples taken after fasting for at least 8 hours.  CBC with Differential/Platelet     Status: Abnormal   Collection Time: 09/23/21  8:02 AM  Result Value Ref Range   WBC 10.5 4.0 - 10.5 K/uL   RBC 2.68 (L) 3.87 - 5.11 MIL/uL   Hemoglobin 8.2 (L) 12.0 - 15.0 g/dL   HCT 25.5 (L) 36.0 - 46.0 %   MCV 95.1 80.0 - 100.0 fL   MCH 30.6 26.0 - 34.0 pg   MCHC 32.2 30.0 - 36.0 g/dL   RDW 18.3 (H) 11.5 - 15.5 %   Platelets 313 150 - 400 K/uL   nRBC 1.2 (H) 0.0 - 0.2 %   Neutrophils Relative % 79 %   Neutro Abs 8.2 (H) 1.7 - 7.7 K/uL   Lymphocytes Relative 9 %   Lymphs Abs 1.0 0.7 - 4.0 K/uL   Monocytes Relative 5 %   Monocytes Absolute 0.6 0.1 - 1.0 K/uL   Eosinophils Relative 1 %   Eosinophils Absolute 0.1 0.0 - 0.5 K/uL   Basophils Relative 0 %   Basophils Absolute 0.0 0.0 - 0.1 K/uL   WBC Morphology MILD LEFT SHIFT (1-5% METAS, OCC MYELO, OCC BANDS)    RBC Morphology RARE NRBCS PRESENT    Smear Review Normal platelet morphology    Immature Granulocytes 6  %   Abs Immature Granulocytes 0.62 (H) 0.00 - 0.07 K/uL   Polychromasia PRESENT     Comment: Performed at [facility], [address], [place], [place] [address]  Basic metabolic panel     Status: Abnormal   Collection Time: 09/23/21  8:02 AM  Result Value Ref Range   Sodium 131 (L) 135 - 145 mmol/L   Potassium 3.5 3.5 - 5.1 mmol/L   Chloride 98 98 - 111 mmol/L   CO2 26 22 - 32 mmol/L   Glucose, Bld 90 70 - 99 mg/dL    Comment: Glucose reference range applies only to samples taken after fasting for at least 8 hours.   BUN 18 8 - 23 mg/dL   Creatinine, Ser 0.89 0.44 - 1.00 mg/dL   Calcium 7.8 (L) 8.9 - 10.3 mg/dL   GFR, Estimated >60 >60 mL/min    Comment: (NOTE) Calculated using the CKD-EPI Creatinine Equation (2021)    Anion gap 7 5 - 15    Comment: Performed at [facility], [address] [place],  [address]  Glucose, capillary     Status: Abnormal   Collection Time: 09/23/21  12:01 PM  Result Value Ref Range   Glucose-Capillary 100 (H) 70 - 99 mg/dL    Comment: Glucose reference range applies only to samples taken after fasting for at least 8 hours.  Resp Panel by RT-PCR (Flu A&B, Covid) Nasopharyngeal Swab     Status: None   Collection Time: 09/23/21  3:40 PM   Specimen: Nasopharyngeal Swab; Nasopharyngeal(NP) swabs in vial transport medium  Result Value Ref Range   SARS Coronavirus 2 by RT PCR NEGATIVE NEGATIVE    Comment: (NOTE) SARS-CoV-2 target nucleic acids are NOT DETECTED.  The SARS-CoV-2 RNA is generally detectable in upper respiratory specimens during the acute phase of infection. The lowest concentration of SARS-CoV-2 viral copies this assay can detect is 138 copies/mL. A negative result does not preclude SARS-Cov-2 infection and should not be used as the sole basis for treatment or other patient management decisions. A negative result may occur with  improper specimen collection/handling, submission of specimen other than  nasopharyngeal swab, presence of viral mutation(s) within the areas targeted by this assay, and inadequate number of viral copies(<138 copies/mL). A negative result must be combined with clinical observations, patient history, and epidemiological information. The expected result is Negative.  Fact Sheet for Patients:  EntrepreneurPulse.com.au  Fact Sheet for Healthcare Providers:  IncredibleEmployment.be  This test is no t yet approved or cleared by the [place] FDA and  has been authorized for detection and/or diagnosis of SARS-CoV-2 by FDA under an Emergency Use Authorization (EUA). This EUA will remain  in effect (meaning this test can be used) for the duration of the COVID-19 declaration under Section 564(b)(1) of the Act, 21 U.S.C.section 360bbb-3(b)(1), unless the authorization is terminated  or revoked sooner.       Influenza A by PCR NEGATIVE NEGATIVE   Influenza B by PCR NEGATIVE NEGATIVE    Comment: (NOTE) The Xpert Xpress SARS-CoV-2/FLU/RSV plus assay is intended as an aid in the diagnosis of influenza from Nasopharyngeal swab specimens and should not be used as a sole basis for treatment. Nasal washings and aspirates are unacceptable for Xpert Xpress SARS-CoV-2/FLU/RSV testing.  Fact Sheet for Patients: EntrepreneurPulse.com.au  Fact Sheet for Healthcare Providers: IncredibleEmployment.be  This test is not yet approved or cleared by the [place] FDA and has been authorized for detection and/or diagnosis of SARS-CoV-2 by FDA under an Emergency Use Authorization (EUA). This EUA will remain in effect (meaning this test can be used) for the duration of the COVID-19 declaration under Section 564(b)(1) of the Act, 21 U.S.C. section 360bbb-3(b)(1), unless the authorization is terminated or revoked.  Performed at [facility], [address] [place], [place] [address]    Glucose, capillary     Status: Abnormal   Collection Time: 09/23/21  4:37 PM  Result Value Ref Range   Glucose-Capillary 112 (H) 70 - 99 mg/dL    Comment: Glucose reference range applies only to samples taken after fasting for at least 8 hours.  Glucose, capillary     Status: Abnormal   Collection Time: 09/23/21  8:50 PM  Result Value Ref Range   Glucose-Capillary 112 (H) 70 - 99 mg/dL    Comment: Glucose reference range applies only to samples taken after fasting for at least 8 hours.  Glucose, capillary     Status: None   Collection Time: 09/24/21  7:48 AM  Result Value Ref Range   Glucose-Capillary 95 70 - 99 mg/dL    Comment: Glucose reference range applies only to samples taken after  fasting for at least 8 hours.  Glucose, capillary     Status: Abnormal   Collection Time: 09/24/21 11:15 AM  Result Value Ref Range   Glucose-Capillary 104 (H) 70 - 99 mg/dL    Comment: Glucose reference range applies only to samples taken after fasting for at least 8 hours.     PHQ2/9: Depression screen [facility] 2/9 10/28/2021 10/07/2021 08/01/2021 07/23/2021 06/21/2021  Decreased Interest 0 0 0 0 0  Down, Depressed, Hopeless 0 0 0 0 0  PHQ - 2 Score 0 0 0 0 0  Altered sleeping 0 0 - - -  Tired, decreased energy 0 0 - - -  Change in appetite 0 0 - - -  Feeling bad or failure about yourself  0 0 - - -  Trouble concentrating 0 0 - - -  Moving slowly or fidgety/restless 0 0 - - -  Suicidal thoughts 0 0 - - -  PHQ-9 Score 0 0 - - -  Difficult doing work/chores - - - - -  Some recent data might be hidden    phq 9 is negative   Fall Risk: Fall Risk  10/28/2021 10/07/2021 08/01/2021 07/23/2021 06/07/2021  Falls in the past year? 0 0 0 - 0  Number falls in past yr: 0 0 0 - 0  Comment - - - - -  Injury with Fall? 0 0 0 - 0  Comment - - - - -  Risk for fall due to : Impaired mobility Impaired mobility Impaired balance/gait;Impaired mobility Impaired mobility;Impaired balance/gait Impaired mobility   Follow up Falls prevention discussed Falls prevention discussed Falls prevention discussed Falls prevention discussed Falls prevention discussed      Functional Status Survey: Is the patient deaf or have difficulty hearing?: No Does the patient have difficulty seeing, even when wearing glasses/contacts?: No Does the patient have difficulty concentrating, remembering, or making decisions?: No Does the patient have difficulty walking or climbing stairs?: Yes Does the patient have difficulty dressing or bathing?: No Does the patient have difficulty doing errands alone such as visiting a doctor's office or shopping?: No    Assessment & Plan  1. Gait difficulty  Needs to stay at Rehab facility until able to walk even if with a walker   2. Bilateral lower extremity edema  Stable

## 2021-10-25 NOTE — Telephone Encounter (Signed)
Patient calling to report PICC line was not replaced until 2/6 and IV abx not restarted until 2/8. Her Picc line came out at [facility] on 10/18/21. Per Dr Delaine Lame we will extend IV abx until 10/29/21. Notified patient and [facility]. Also notified [place] team for OPAT tracking.   Patient asked about remaining at [facility] for PT rehab. I advised her to contact her PCP regarding PT orders.

## 2021-10-27 ENCOUNTER — Other Ambulatory Visit: Payer: Self-pay | Admitting: Family Medicine

## 2021-10-28 ENCOUNTER — Other Ambulatory Visit: Payer: Self-pay

## 2021-10-28 ENCOUNTER — Ambulatory Visit (INDEPENDENT_AMBULATORY_CARE_PROVIDER_SITE_OTHER): Payer: Medicare Other | Admitting: Family Medicine

## 2021-10-28 ENCOUNTER — Encounter: Payer: Self-pay | Admitting: Family Medicine

## 2021-10-28 ENCOUNTER — Telehealth: Payer: Self-pay

## 2021-10-28 VITALS — BP 112/70 | HR 63 | Resp 16 | Ht 71.0 in | Wt 298.0 lb

## 2021-10-28 DIAGNOSIS — R600 Localized edema: Secondary | ICD-10-CM

## 2021-10-28 DIAGNOSIS — I825Z1 Chronic embolism and thrombosis of unspecified deep veins of right distal lower extremity: Secondary | ICD-10-CM | POA: Diagnosis not present

## 2021-10-28 DIAGNOSIS — R269 Unspecified abnormalities of gait and mobility: Secondary | ICD-10-CM

## 2021-10-28 NOTE — Telephone Encounter (Signed)
Patient is coming in to office for follow up today and will bring paperwork for [place] to fill out at that time.

## 2021-10-28 NOTE — Telephone Encounter (Signed)
Pt calling back, does not know who to speak with, but the number to call is [phone]

## 2021-10-28 NOTE — Telephone Encounter (Signed)
Copied from [place] #[id number]. Topic: General - Other >> Oct 25, 2021  4:35 PM Becky Trevino A wrote: Reason for CRM: The patient has called to request the assistance of the practice with extending their time in rehabilitation   The patient is receiving physical therapy and rehabilitation to help with their mobility   The patient has been directed to contact their PCP and request for additional documentation to provide for their insurance company's coverage of their stay  Please contact the patient further

## 2021-10-28 NOTE — Telephone Encounter (Signed)
Spoke with patient, she is going to get contact information of who we need to speak to in order to extend her PT/OT.

## 2021-10-29 ENCOUNTER — Telehealth: Payer: Self-pay

## 2021-10-29 NOTE — Telephone Encounter (Signed)
Copied from [place] [id number]. Topic: General - Other >> Oct 28, 2021  4:29 PM Yvette Rack wrote: Reason for CRM: Nunda with [facility] requests return call. Cb# [phone]

## 2021-11-01 ENCOUNTER — Other Ambulatory Visit: Payer: Self-pay | Admitting: Family Medicine

## 2021-11-07 ENCOUNTER — Telehealth: Payer: Self-pay

## 2021-11-07 NOTE — Telephone Encounter (Signed)
-----   Message from [name], MD sent at 10/29/2021  1:38 PM EST ----- Can you please check to see whether she has finished IV ceftriaxone today? PICC will have to be removed- She will have to go on amoxicillin 500mg  PO TID for 8 weeks  for septic arthritis rt hip ( if still in SNF they should give it to her, if she has gone home we can send the prescription to the pharmacy) she has f/u with me on 12/17/21- thx

## 2021-11-07 NOTE — Telephone Encounter (Signed)
I spoke to patient and she stated her picc line has been removed and she came home from the the facility on 11/06/21. Patient states she is taking the amoxicillin 500 mg TID and aware to take for 8 weeks and follow up with our office on 44/23. Gennaro Lizotte T Brooks Sailors

## 2021-11-07 NOTE — Telephone Encounter (Signed)
Copied from [place] [id number]. Topic: General - Other >> Nov 07, 2021  9:12 AM Becky Trevino A wrote: Reason for CRM: The patient would like to speak with [name] when available  The patient would like to find a home health agency that can assist them with daily living   The patient has previously worked with [facility]   Please contact further when possible

## 2021-11-08 ENCOUNTER — Telehealth: Payer: Self-pay

## 2021-11-08 ENCOUNTER — Other Ambulatory Visit: Payer: Self-pay

## 2021-11-08 DIAGNOSIS — L03116 Cellulitis of left lower limb: Secondary | ICD-10-CM

## 2021-11-08 DIAGNOSIS — M159 Polyosteoarthritis, unspecified: Secondary | ICD-10-CM

## 2021-11-08 DIAGNOSIS — I10 Essential (primary) hypertension: Secondary | ICD-10-CM

## 2021-11-08 DIAGNOSIS — R32 Unspecified urinary incontinence: Secondary | ICD-10-CM

## 2021-11-08 DIAGNOSIS — I872 Venous insufficiency (chronic) (peripheral): Secondary | ICD-10-CM

## 2021-11-08 DIAGNOSIS — E1122 Type 2 diabetes mellitus with diabetic chronic kidney disease: Secondary | ICD-10-CM

## 2021-11-08 DIAGNOSIS — M5030 Other cervical disc degeneration, unspecified cervical region: Secondary | ICD-10-CM

## 2021-11-08 DIAGNOSIS — G4700 Insomnia, unspecified: Secondary | ICD-10-CM

## 2021-11-08 NOTE — Telephone Encounter (Signed)
Returned patient's call to get details of what kind of note she needed, no answer. Left voicemail for return call for specifics.

## 2021-11-08 NOTE — Telephone Encounter (Signed)
Referral placed.

## 2021-11-08 NOTE — Telephone Encounter (Signed)
Copied from [place] [id number]. Topic: General - Other >> Nov 08, 2021  9:16 AM Yvette Rack wrote: Reason for CRM: Pt daughter Pennie Rushing requests return call to discuss getting a note from Dr. Ancil Boozer. Cb# [phone]

## 2021-11-13 NOTE — Telephone Encounter (Signed)
Patient called asking what antibiotic she is supposed to be taking and how often. Relayed that amoxicillin was prescribed for her to take three times daily. Patient verbalized understanding and has no further questions.  ? ?Beryle Flock, RN ? ?

## 2021-11-21 ENCOUNTER — Telehealth: Payer: Self-pay | Admitting: Family Medicine

## 2021-11-21 NOTE — Telephone Encounter (Signed)
Anna PT with [facility] is calling in because she received order to work with pt. Vicente Males would like to start working with pt early next week if possible. She says that her scheduled is booked for the remainder of this week.  ? ?[phone] Vicente Males ?

## 2021-11-25 ENCOUNTER — Telehealth: Payer: Self-pay | Admitting: Family Medicine

## 2021-11-25 NOTE — Telephone Encounter (Signed)
Home Health Verbal Orders - Caller/Agency: [facility] ? ?Callback Number: [phone] ? ?Requesting OT/PT/Skilled Nursing/Social Work/Speech Therapy: PT ? ?Frequency: 2 week 4 and 1 week 4  ? ?

## 2021-11-26 ENCOUNTER — Telehealth: Payer: Self-pay | Admitting: Family Medicine

## 2021-11-26 NOTE — Telephone Encounter (Signed)
Home Health Verbal Orders - Caller/Agency: Anselm Pancoast, OT from [facility] Number: [phone] ?Requesting OT/PT/Skilled Nursing/Social Work/Speech Therapy: OT  ?Frequency:  ? ?1w6  ? ?For ADL, IADL, Strength, functional transfer, and dynamic standing  ? ?

## 2021-11-26 NOTE — Telephone Encounter (Signed)
Called and left message giving verbal orders for OT per Dr. Ancil Boozer ?

## 2021-11-26 NOTE — Telephone Encounter (Signed)
Called and gave verbal PT order per Dr. Ancil Boozer.  ?

## 2021-11-29 ENCOUNTER — Ambulatory Visit: Payer: Self-pay | Admitting: *Deleted

## 2021-11-29 NOTE — Telephone Encounter (Signed)
?  Chief Complaint: low bp, rales in LLL ?Symptoms: none ?Frequency: HHRN took BP today ?Pertinent Negatives: Patient denies dizziness weakness ?Disposition: '[]'$ ED /'[x]'$ Urgent Care (no appt availability in office) / '[x]'$ Appointment(In office/virtual)/ '[]'$  [place] [facility]/ '[]'$ Home Care/ '[]'$ Refused Recommended Disposition /'[]'$ [place]/ '[x]'$  Follow-up with PCP ?Additional Notes: Pt's HHRN called for lower than usual BP, and rales in LLL. BP 29/93 usual systolic over 716. Daughter states she can not get her mother out, it requires a transportation [place]. She is asymptomatic, daughter will recheck BP and stay with pt. HHRN states can do mobile xray if wanted, it would need stat mobile xray order for that. Family more interested in seeing if should hold any of her  three BP meds. HHRN will return Monday. ? ? ?Reason for Disposition ? [9] Fall in systolic BP > 20 mm Hg from normal AND [2] NOT dizzy, lightheaded, or weak ? ?Answer Assessment - Initial Assessment Questions ?1. BLOOD PRESSURE: "What is the blood pressure?" "Did you take at least two measurements 5 minutes apart?" ?    Yes 80/60, has always been over 678-938 systolic ?2. ONSET: "When did you take your blood pressure?" ?    Nurse just arrived in home ?3. HOW: "How did you obtain the blood pressure?" (e.g., visiting nurse, automatic home BP monitor) ?    cuff ?4. HISTORY: "Do you have a history of low blood pressure?" "What is your blood pressure normally?" ?    no ?5. MEDICATIONS: "Are you taking any medications for blood pressure?" If Yes, ask: "Have they been changed recently?" ?    Metoprolol, torsemide, amiodorone  ?6. PULSE RATE: "Do you know what your pulse rate is?"  ?    62 ?7. OTHER SYMPTOMS: "Have you been sick recently?" "Have you had a recent injury?" ?    no ?8. PREGNANCY: "Is there any chance you are pregnant?" "When was your last menstrual period?" ?    na ? ?Protocols used: Blood Pressure - Low-A-AH ? ?

## 2021-12-02 ENCOUNTER — Telehealth: Payer: Self-pay | Admitting: Family Medicine

## 2021-12-02 NOTE — Telephone Encounter (Signed)
Called pt no answer, called pt daughter Ronny Bacon to check in about her mom. Daughter states she has done a lot better throughout the weekend, they're consistently monitoring her BP at home and its reading better. I advised pt daughter if her mom continues having Low Bp readings or any other concerns to call to make an appointment or to not hesitate taking her to [facility] or ER. ?

## 2021-12-02 NOTE — Telephone Encounter (Signed)
Becky Trevino calling from [facility] is calling to report that the patient had crackles right lower lobe today and Friday.  ? ?CB- [phone] ?

## 2021-12-03 ENCOUNTER — Ambulatory Visit (INDEPENDENT_AMBULATORY_CARE_PROVIDER_SITE_OTHER): Payer: Medicare Other | Admitting: Family Medicine

## 2021-12-03 ENCOUNTER — Ambulatory Visit
Admission: RE | Admit: 2021-12-03 | Discharge: 2021-12-03 | Disposition: A | Discharge status: Home / Self Care | Payer: Medicare Other | Source: Ambulatory Visit | Attending: Family Medicine | Admitting: Family Medicine

## 2021-12-03 ENCOUNTER — Encounter: Payer: Self-pay | Admitting: Family Medicine

## 2021-12-03 ENCOUNTER — Ambulatory Visit
Admission: RE | Admit: 2021-12-03 | Discharge: 2021-12-03 | Disposition: A | Discharge status: Home / Self Care | Payer: Medicare Other | Attending: Family Medicine | Admitting: Family Medicine

## 2021-12-03 VITALS — BP 130/82 | HR 76 | Temp 98.0°F | Resp 16 | Ht 71.0 in | Wt 318.0 lb

## 2021-12-03 DIAGNOSIS — I10 Essential (primary) hypertension: Secondary | ICD-10-CM | POA: Diagnosis not present

## 2021-12-03 DIAGNOSIS — R0989 Other specified symptoms and signs involving the circulatory and respiratory systems: Secondary | ICD-10-CM

## 2021-12-03 DIAGNOSIS — I890 Lymphedema, not elsewhere classified: Secondary | ICD-10-CM | POA: Diagnosis not present

## 2021-12-03 DIAGNOSIS — R600 Localized edema: Secondary | ICD-10-CM

## 2021-12-03 NOTE — Telephone Encounter (Signed)
Spoke with pt and she is going to see if she can get a ride. It is okay to schedule her with Dr Ky Barban or whoever has an opening

## 2021-12-03 NOTE — Progress Notes (Signed)
? ?  SUBJECTIVE:  ? ?CHIEF COMPLAINT / HPI:  ? ?Hypertension, Afib, PAD, OSA: ?- Medications: torsemide, metoprolol, amiodarone, eliquis, ASA ?- Compliance: good ?- Checking BP at home: [facility] checks occasionally. Lower BP noted occasionally, mostly after PT when has to take pain medication.  ?- Denies any SOB, CP, vision changes, medication SEs, or symptoms of hypotension ? ?Rales in lungs ?- HHRN noted rales in LLL ?- ECHO 09/2021 with LVEF 65-70%, no WMA. ?- endorsing a little cough. No SOB ?- sleeping elevated but not new.  ?- has longstanding lymphedema, worsened since being in rehab. Has lymphedema pumps at home, applying more regularly now.  ? ? ?OBJECTIVE:  ? ?BP 130/82   Pulse 76   Temp 98 ?F (36.7 ?C) (Oral)   Resp 16   Ht '5\' 11"'$  (1.803 m)   Wt (!) 318 lb (144.2 kg)   SpO2 95%   BMI 44.35 kg/m?   ?Gen: non-toxic appearing, in NAD. In power wheelchair. ?Card: RRR ?Lungs: CTAB, bibasilar rales. ?Ext: WWP. 2+ pitting edema to knees, 1+ to thighs b/l. ? ? ?ASSESSMENT/PLAN:  ? ?Essential hypertension ?With some lower BP reported by [facility], likely 2/2 pain medication given taken following PT. At goal today. No changes for now. Recommend keeping log of pain medication to assess for correlation.  ? ?Lymphedema ?Recent worsening with recent rehab stay. Continue lymphedema pumps, massage. Increase torsemide to 2 pills daily for the next few days. Obtaining labs.  ?  ?Rales in lungs ?Appreciated on exam today, bibasilar. No symptoms or focality to suggest infectious PNA. Will obtain CXR. Recommend increasing torsemide to 2 pills daily for next few days. Continue lymphedema pumps and massage. Obtaining labs. F/u in 2 weeks.  ? ?Myles Gip, DO ?

## 2021-12-03 NOTE — Assessment & Plan Note (Signed)
Recent worsening with recent rehab stay. Continue lymphedema pumps, massage. Increase torsemide to 2 pills daily for the next few days. Obtaining labs.  ?

## 2021-12-03 NOTE — Assessment & Plan Note (Signed)
With some lower BP reported by [facility], likely 2/2 pain medication given taken following PT. At goal today. No changes for now. Recommend keeping log of pain medication to assess for correlation.  ?

## 2021-12-03 NOTE — Telephone Encounter (Signed)
Pt returned call and was not able to find a ride. I told her to give [place] a call if she can find a friend or family member to bring her or if get worse to call EMS. Pt verbalized understanding.

## 2021-12-04 ENCOUNTER — Telehealth: Payer: Self-pay

## 2021-12-04 LAB — BASIC METABOLIC PANEL
BUN/Creatinine Ratio: 15 (calc) (ref 6–22)
BUN: 21 mg/dL (ref 7–25)
CO2: 26 mmol/L (ref 20–32)
Calcium: 9.1 mg/dL (ref 8.6–10.4)
Chloride: 105 mmol/L (ref 98–110)
Creat: 1.4 mg/dL — ABNORMAL HIGH (ref 0.60–1.00)
Glucose, Bld: 86 mg/dL (ref 65–99)
Potassium: 4.7 mmol/L (ref 3.5–5.3)
Sodium: 141 mmol/L (ref 135–146)

## 2021-12-04 LAB — CBC
HCT: 32.1 % — ABNORMAL LOW (ref 35.0–45.0)
Hemoglobin: 10.2 g/dL — ABNORMAL LOW (ref 11.7–15.5)
MCH: 30.1 pg (ref 27.0–33.0)
MCHC: 31.8 g/dL — ABNORMAL LOW (ref 32.0–36.0)
MCV: 94.7 fL (ref 80.0–100.0)
MPV: 10.3 fL (ref 7.5–12.5)
Platelets: 232 10*3/uL (ref 140–400)
RBC: 3.39 10*6/uL — ABNORMAL LOW (ref 3.80–5.10)
RDW: 15.8 % — ABNORMAL HIGH (ref 11.0–15.0)
WBC: 5.3 10*3/uL (ref 3.8–10.8)

## 2021-12-04 LAB — BRAIN NATRIURETIC PEPTIDE: Brain Natriuretic Peptide: 138 pg/mL — ABNORMAL HIGH (ref <100)

## 2021-12-04 NOTE — Telephone Encounter (Signed)
Copied from [place] [id number]. Topic: General - Other ?>> Dec 04, 2021 10:01 AM Becky Trevino wrote: ?Reason for CRM: Pt stated she had a missed call from the office so she was returning the call. Pt requests call back at [phone] ?

## 2021-12-10 ENCOUNTER — Ambulatory Visit: Payer: Medicare Other | Admitting: Family Medicine

## 2021-12-13 ENCOUNTER — Other Ambulatory Visit: Payer: Self-pay

## 2021-12-13 LAB — HM DIABETES FOOT EXAM

## 2021-12-13 MED ORDER — AMOXICILLIN 500 MG PO CAPS: 500.0000 mg | ORAL_CAPSULE | Freq: Three times a day (TID) | ORAL | 0 refills | Status: DC

## 2021-12-16 NOTE — Progress Notes (Deleted)
Name: Becky Trevino   MRN: [id number]    DOB: 1946/08/14   Date:12/16/2021 ? ?     Progress Note ? ?Subjective ? ?Chief Complaint ? ?Follow Up ? ?HPI ? ?DMII with renal manifestation and obesity:  she denies polyuria, polyphagia or polydipsia. A1C done 09/2020 was 6.1 % tlast one was 5.4 % done during last admission to [facility] in 09/2021  She has CKI - last GFR 43 , dyslipidemia and obesity . She cannot take ACE due to angioedema ?  ?Obesity Morbid: she has been unable to be active, she is getting PT at [facility]. She is trying to eat small portions  ?  ?History of hypotension:  she was  Midodrine . She is now off midodrine and tolerating 25 mg of metoprolol, heart rate is at goal, she states she states no longer having dizziness like she used to ,  BP towards low end of normal but improved since last visit   ?  ?Chronic DVT  right popliteal vein: diagnosed 10/27/2019, she was on Xarelto and at one time Eliquis  but stopped secondary to iliopsoas hematoma, she still has some swelling of right thigh, she has an IV filter. Dr. Tasia Catchings had stopped Eliquis but Dr. Clayborn Bigness placed her back on it , we will make sure she is supposed to take it. Recently seen by Dr. Payton Mccallum.  ?  ?Hyperlipidemia: taking Atorvastatin daily and denies side effects. No chest pain , no muscles pain LDL is at goal  ?  ?Insomnia/OSA :Last Echo done by Dr. Clayborn Bigness showed mild pulmonary hypertension . Reminded her to resume CPAP , she states she wants to see Dr. Tami Ribas first before resuming CPAP , worried because of nose bleeds and cannot tolerate it . She would like to resume Trazodone since she is living at [facility] and is having more insomnia lately  ?  ?OA: multiple joints, sees Ortho, Dr. Rudene Christians . She had revision of right knee replacement March 2019 because of infection. Still under the care of Ortho now also seeing Dr. Ola Spurr - ID. She is still on daily antibiotics - Amoxicillin and also Rocephin every two weeks since recent hospital  stay. Culture of synovial fluid did not grown bacteria ? ?Left femoral nerve palsy:  seen by Dr. Rudene Christians, Dr. Manuella Ghazi, now seeing neurosurgeon at [facility] , Dr. Tamala Julian. She is able to walk short distances but s wearing brace because left knee is unstable due to femoral nerve palsy. She still  needs assistance bathing, cannot drive, cannot transfer alone . She was diagnosed with a hematoma of left iliopsoas during hospital stay 10/2019 , but she had repeat  CT pelvis 01/09/2020 that showed resolution of hematoma. She is was off Eliquis and  only on aspirin do  to history of hematoma. Dr. Tamala Julian reviewed her case and advised against doing the surgery to decompress the nerve.  She saw Dr. Clayborn Bigness in December and he advised her to resume Eliquis , she is not having any side effects at this time. Reached out to his office to confrim she is supposed to be on it and Delicia White confirmed  ?  ?CKI stage III: GFR around 50's normal urine micro , not on ace at this time. BP is towards low end of normal , she had a drop during recent hospital stay but back to baseline prior to discharge.  ?  ?Atherosclerosis of Aorta: she is on statin and back on Eliquis Denies side effects  ?  ?  Anemia: under the care of Dr. Tasia Catchings, HCT low but stable during hospital stay  ?  ?Patient Active Problem List  ? Diagnosis Date Noted  ? PAD (peripheral artery disease) ([place]) 09/27/2021  ? Cellulitis of left lower extremity 09/15/2021  ? Elevated troponin level not due myocardial infarction 09/15/2021  ? AF (paroxysmal atrial fibrillation) ([place]) 09/15/2021  ? Effusion of right hip 07/18/2021  ? Primary osteoarthritis of right hip 07/18/2021  ? Right hip joint effusion   ? Atherosclerosis of aorta ([place]) 06/07/2021  ? Anasarca   ? Hypothyroidism   ? Hypotension   ? Class 2 obesity without serious comorbidity with body mass index (BMI) of 39.0 to 39.9 in adult   ? Thrombocytopenia ([place])   ? Mild protein-calorie malnutrition ([place])   ? Hyperbilirubinemia   ? Injury of  left femoral nerve 08/28/2020  ? History of DVT (deep vein thrombosis) 06/25/2020  ? Lymphedema 06/25/2020  ? History of pelvic hematoma 02/10/2020  ? Foot ulcer, right, limited to breakdown of skin ([place]) 12/02/2019  ? Chronic venous hypertension due to deep vein thrombosis (DVT) 10/30/2019  ? Hematoma   ? Normocytic anemia 08/07/2019  ? Chronic infection of right knee ([place]) 11/25/2018  ? Infection and inflammatory reaction due to internal joint prosthesis, subsequent encounter 01/25/2018  ? Infection of total right knee replacement ([place]) 11/19/2017  ? Type 2 diabetes mellitus with stage 3b chronic kidney disease, without long-term current use of insulin ([place]) 11/06/2016  ? Primary osteoarthritis of knee 03/11/2016  ? Primary osteoarthritis of left hip 12/11/2015  ? Essential hypertension 04/21/2015  ? Edema leg 04/21/2015  ? Chronic kidney disease (CKD), stage III (moderate) ([place]) 04/21/2015  ? Chronic venous insufficiency 04/21/2015  ? DDD (degenerative disc disease), cervical 04/21/2015  ? Diabetes mellitus with renal manifestation ([place]) 04/21/2015  ? Hyperlipidemia 04/21/2015  ? Bladder cystocele 04/21/2015  ? Urinary incontinence 04/21/2015  ? Leg varices 04/21/2015  ? Insomnia 04/21/2015  ? Eczema intertrigo 04/21/2015  ? Obesity, Class III, BMI 40-49.9 (morbid obesity) ([place]) 04/21/2015  ? OSA (obstructive sleep apnea) 04/21/2015  ? Supraventricular premature beats 04/21/2015  ? Osteoarthritis of multiple joints 04/21/2015  ? H/O Spinal surgery 11/14/2013  ? Detrusor muscle hypertonia 11/07/2013  ? ? ?Past Surgical History:  ?Procedure Laterality Date  ? ABDOMINAL HYSTERECTOMY  1993  ? Total  ? BUNIONECTOMY Bilateral 1993  ? I & D KNEE WITH POLY EXCHANGE Right 11/19/2017  ? Procedure: RIGHT KNEE POLY EXCHANGE WITH IRRIGATION AND DEBRIDEMENT;  Surgeon: Hessie Knows, MD;  Location: [facility] OR[facility];  Service: Orthopedics;  Laterality: Right;  ? IR FLUORO GUIDED NEEDLE PLC ASPIRATION/INJECTION LOC  09/19/2021  ? JOINT  REPLACEMENT    ? bilateral knee  ? LAMINECTOMY  11/14/2013  ?  Cervical Fusion , [facility], Dr. Delilah Shan  ? LASER ABLATION Bilateral 07/29/2012  ? Dr. Lucky Cowboy  ? LOWER EXTREMITY ANGIOGRAPHY Right 11/02/2019  ? Procedure: Lower Extremity Angiography;  Surgeon: Katha Cabal, MD;  Location: [place] [facility];  Service: Cardiovascular;  Laterality: Right;  ? ORIF ANKLE FRACTURE Right 09/02/2019  ? Procedure: OPEN REDUCTION INTERNAL FIXATION (ORIF) ANKLE FRACTURE BIMALLEOLAR;  Surgeon: [name], DPM;  Location: [facility];  Service: Podiatry;  Laterality: Right;  ? PERIPHERAL VASCULAR THROMBECTOMY Right 03/02/2020  ? Procedure: PERIPHERAL VASCULAR THROMBECTOMY;  Surgeon: Algernon Huxley, MD;  Location: [place] [facility];  Service: Cardiovascular;  Laterality: Right;  ? REPLACEMENT TOTAL KNEE Left 03/2010  ? Dr. Rudene Christians  ? TEE WITHOUT CARDIOVERSION  N/A 09/19/2021  ? Procedure: TRANSESOPHAGEAL ECHOCARDIOGRAM (TEE);  Surgeon: Corey Skains, MD;  Location: [facility];  Service: Cardiovascular;  Laterality: N/A;  ? TOTAL HIP ARTHROPLASTY Left 12/11/2015  ? Procedure: TOTAL HIP ARTHROPLASTY ANTERIOR APPROACH;  Surgeon: Hessie Knows, MD;  Location: [facility];  Service: Orthopedics;  Laterality: Left;  ? TOTAL KNEE ARTHROPLASTY Right 03/11/2016  ? Procedure: TOTAL KNEE ARTHROPLASTY;  Surgeon: Hessie Knows, MD;  Location: [facility];  Service: Orthopedics;  Laterality: Right;  ? ? ?Family History  ?Problem Relation Age of Onset  ? Diabetes Mother   ? Hyperlipidemia Mother   ? Hypertension Mother   ? Diabetes Father   ? Hyperlipidemia Father   ? Hypertension Father   ? Obesity Father   ? Hypertension Sister   ? Hyperlipidemia Sister   ? Hyperlipidemia Sister   ? Breast cancer Neg Hx   ? ? ?Social History  ? ?Tobacco Use  ? Smoking status: Never  ? Smokeless tobacco: Never  ?Substance Use Topics  ? Alcohol use: No  ?  Alcohol/week: 0.0 standard drinks  ? ? ? ?Current Outpatient Medications:  ?  acetaminophen (TYLENOL) 650 MG CR tablet, Take  650-1,300 mg by mouth 2 (two) times daily as needed for pain., Disp: , Rfl:  ?  amiodarone (PACERONE) 200 MG tablet, Take 200 mg by mouth 2 (two) times daily., Disp: , Rfl:  ?  amoxicillin (AMOXIL) 500 MG ca

## 2021-12-17 ENCOUNTER — Telehealth (INDEPENDENT_AMBULATORY_CARE_PROVIDER_SITE_OTHER): Payer: Medicare Other | Admitting: Family Medicine

## 2021-12-17 ENCOUNTER — Encounter: Payer: Self-pay | Admitting: Family Medicine

## 2021-12-17 ENCOUNTER — Ambulatory Visit: Payer: Medicare Other | Admitting: Family Medicine

## 2021-12-17 ENCOUNTER — Ambulatory Visit: Payer: Medicare Other | Attending: Infectious Diseases | Admitting: Infectious Diseases

## 2021-12-17 VITALS — BP 109/71 | HR 59 | Temp 98.6°F

## 2021-12-17 DIAGNOSIS — I129 Hypertensive chronic kidney disease with stage 1 through stage 4 chronic kidney disease, or unspecified chronic kidney disease: Secondary | ICD-10-CM | POA: Insufficient documentation

## 2021-12-17 DIAGNOSIS — I4891 Unspecified atrial fibrillation: Secondary | ICD-10-CM | POA: Diagnosis not present

## 2021-12-17 DIAGNOSIS — Z86718 Personal history of other venous thrombosis and embolism: Secondary | ICD-10-CM | POA: Insufficient documentation

## 2021-12-17 DIAGNOSIS — E039 Hypothyroidism, unspecified: Secondary | ICD-10-CM | POA: Diagnosis not present

## 2021-12-17 DIAGNOSIS — Z8614 Personal history of Methicillin resistant Staphylococcus aureus infection: Secondary | ICD-10-CM | POA: Diagnosis not present

## 2021-12-17 DIAGNOSIS — Z7901 Long term (current) use of anticoagulants: Secondary | ICD-10-CM | POA: Diagnosis not present

## 2021-12-17 DIAGNOSIS — Z09 Encounter for follow-up examination after completed treatment for conditions other than malignant neoplasm: Secondary | ICD-10-CM | POA: Insufficient documentation

## 2021-12-17 DIAGNOSIS — R600 Localized edema: Secondary | ICD-10-CM

## 2021-12-17 DIAGNOSIS — Z79899 Other long term (current) drug therapy: Secondary | ICD-10-CM | POA: Insufficient documentation

## 2021-12-17 DIAGNOSIS — Z96643 Presence of artificial hip joint, bilateral: Secondary | ICD-10-CM | POA: Diagnosis not present

## 2021-12-17 DIAGNOSIS — R7881 Bacteremia: Secondary | ICD-10-CM | POA: Diagnosis present

## 2021-12-17 DIAGNOSIS — E1122 Type 2 diabetes mellitus with diabetic chronic kidney disease: Secondary | ICD-10-CM | POA: Insufficient documentation

## 2021-12-17 DIAGNOSIS — N189 Chronic kidney disease, unspecified: Secondary | ICD-10-CM | POA: Diagnosis not present

## 2021-12-17 DIAGNOSIS — E1140 Type 2 diabetes mellitus with diabetic neuropathy, unspecified: Secondary | ICD-10-CM

## 2021-12-17 DIAGNOSIS — E785 Hyperlipidemia, unspecified: Secondary | ICD-10-CM | POA: Insufficient documentation

## 2021-12-17 DIAGNOSIS — R0989 Other specified symptoms and signs involving the circulatory and respiratory systems: Secondary | ICD-10-CM | POA: Diagnosis not present

## 2021-12-17 DIAGNOSIS — Z96653 Presence of artificial knee joint, bilateral: Secondary | ICD-10-CM | POA: Diagnosis not present

## 2021-12-17 MED ORDER — AMOXICILLIN 500 MG PO CAPS: 500.0000 mg | ORAL_CAPSULE | Freq: Two times a day (BID) | ORAL | 6 refills | Status: DC

## 2021-12-17 NOTE — Progress Notes (Signed)
NAME: Becky Trevino  ?DOB: 17-Jun-1946  ?MRN: [id number]  ?Date/Time: 12/17/2021 11:10 AM ? ?Subjective:  ? ??pt here for follow up visit ? ?Becky Trevino is a 76 y.o. 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, recurrent Group B strep bacteremia intially from the wound ORIF diabetes mellitus, hyperlipidemia, hypertension, degenerative disc , left gluteal pelvic hematoma after a fall in Nov 2020, cervical decompression surgery march 2015, DVT rt lower extremity , has IVC filter-  lymphedema legs ?Is here for follow up ?PT was recently treated with 6 weeks of IV ceftriaxone which she completed on 2/11 for recurrent group B streptococcal bacteremia with unclear source possible rt hip septic arthritis ?After finishing iv ceftriaxone she has been on amoxicillin 533m TID  ?She is doing better ?She is abck home from rehab ?Gets 2 days PT at home and OT is seeing her as well ?No fever ?Mobility better ?Swelling legs much better ? ? ?Patient has a complicated Infection history  ?  ?In 2017 had rt knee replacement ?  In February 2019 she had swelling of the knee and an aspiration revealed 59,000 WBCs with 98% polymorphs.  Cultures were negative as well as for crystal analysis.  Sed rate was 76.  On March 7 , 2019 she had an incision and drainage and polyethylene exchange of the right knee.  The cultures grew coag negative staph.  PICC line was placed and she was given vancomycin and ceftriaxone for 6 weeks,  , culture had coag neg staph, was on suppressive doxy + rifampin because of retained prosthesis. ?Since  oct 2019  has been on Doxy suppressive therapy which was discontinued Jan 2023 ? ? ? 08/07/19-08/09/19 after  she passed out at work and hit a metal pole ?She developed a left gluteal hematoma and 295 ml of dark purple blood aspirated by ortho on Dec , 11 2020 - no culture sent ? ? ?Feb 2021- Group B strep bacteremia ?Rt ankle fracture ORIF Dec  2020-  non healing wound with GBS and acinetobacter in Feb 2021- treated with 4 weeks of Iv antibiotic- unasyn  ? ?09/02/20-09/14/20 for left upper thigh cellulitis, MRSA bacteremia and group B strep bacteremia and left lower lobe infiltrate due to MRSA ?She got IV vanco followed by Daptomycin for a total of 22 days ?  ? 07/16/21 -07/20/21 admitted for sepsis/SIRS- had epistaxis prior to admission ?no source identified. Blood culture neg ? No pneumonia, no cellulitis, no Uti ?Chronic rt hip arthritis-pain- 7cc aspirated and cell count was initially reported as  73K - (37 L, 54M and 41% N) but repeat was 1300 and then 900 ?NO septic arthritis ? ? 09/14/21-09/24/21- hospitalized for fever and sepsis- GB strep bacteremia ?TEE 09/19/21  neg ?Rt hip aspiration -neg ? ? ?Past Medical History:  ?Diagnosis Date  ? Cervical spondylosis   ? Chronic kidney disease   ? Stage 3  ? DDD (degenerative disc disease), cervical   ? [facility]  ? Diabetes mellitus without complication ([place]   ? Diverticulosis   ? Hyperlipidemia   ? Hypertension   ? Intertrigo   ? Leg cramps   ? Leg varices   ? Obesity   ? OSA (obstructive sleep apnea)   ? Symptomatic menopausal or female climacteric states   ? Syncope   ?  ?Past Surgical History:  ?Procedure Laterality Date  ? ABDOMINAL HYSTERECTOMY  1993  ?  Total  ? BUNIONECTOMY Bilateral 1993  ? I & D KNEE WITH POLY EXCHANGE Right 11/19/2017  ? Procedure: RIGHT KNEE POLY EXCHANGE WITH IRRIGATION AND DEBRIDEMENT;  Surgeon: Hessie Knows, MD;  Location: [facility];  Service: Orthopedics;  Laterality: Right;  ? IR FLUORO GUIDED NEEDLE PLC ASPIRATION/INJECTION LOC  09/19/2021  ? JOINT REPLACEMENT    ? bilateral knee  ? LAMINECTOMY  11/14/2013  ?  Cervical Fusion , [facility], Dr. Delilah Shan  ? LASER ABLATION Bilateral 07/29/2012  ? Dr. Lucky Cowboy  ? LOWER EXTREMITY ANGIOGRAPHY Right 11/02/2019  ? Procedure: Lower Extremity Angiography;  Surgeon: Katha Cabal, MD;  Location: [place] [facility];  Service: Cardiovascular;   Laterality: Right;  ? ORIF ANKLE FRACTURE Right 09/02/2019  ? Procedure: OPEN REDUCTION INTERNAL FIXATION (ORIF) ANKLE FRACTURE BIMALLEOLAR;  Surgeon: [name], DPM;  Location: [facility];  Service: Podiatry;  Laterality: Right;  ? PERIPHERAL VASCULAR THROMBECTOMY Right 03/02/2020  ? Procedure: PERIPHERAL VASCULAR THROMBECTOMY;  Surgeon: Algernon Huxley, MD;  Location: [place] [facility];  Service: Cardiovascular;  Laterality: Right;  ? REPLACEMENT TOTAL KNEE Left 03/2010  ? Dr. Rudene Christians  ? TEE WITHOUT CARDIOVERSION N/A 09/19/2021  ? Procedure: TRANSESOPHAGEAL ECHOCARDIOGRAM (TEE);  Surgeon: Corey Skains, MD;  Location: [facility];  Service: Cardiovascular;  Laterality: N/A;  ? TOTAL HIP ARTHROPLASTY Left 12/11/2015  ? Procedure: TOTAL HIP ARTHROPLASTY ANTERIOR APPROACH;  Surgeon: Hessie Knows, MD;  Location: [facility];  Service: Orthopedics;  Laterality: Left;  ? TOTAL KNEE ARTHROPLASTY Right 03/11/2016  ? Procedure: TOTAL KNEE ARTHROPLASTY;  Surgeon: Hessie Knows, MD;  Location: [facility];  Service: Orthopedics;  Laterality: Right;  ?  ?Social History  ? ?Socioeconomic History  ? Marital status: Married  ?  Spouse name: Not on file  ? Number of children: 1  ? Years of education: Not on file  ? Highest education level: Not on file  ?Occupational History  ? Not on file  ?Tobacco Use  ? Smoking status: Never  ? Smokeless tobacco: Never  ?Vaping Use  ? Vaping Use: Never used  ?Substance and Sexual Activity  ? Alcohol use: No  ?  Alcohol/week: 0.0 standard drinks  ? Drug use: No  ? Sexual activity: Yes  ?  Partners: Male  ?Other Topics Concern  ? Not on file  ?Social History Narrative  ? Married, one adopted grown child   ? ?Social Determinants of Health  ? ?Financial Resource Strain: Low Risk   ? Difficulty of Paying Living Expenses: Not hard at all  ?Food Insecurity: No Food Insecurity  ? Worried About Charity fundraiser in the Last Year: Never true  ? Ran Out of Food in the Last Year: Never true  ?Transportation Needs: No  Transportation Needs  ? Lack of Transportation (Medical): No  ? Lack of Transportation (Non-Medical): No  ?Physical Activity: Insufficiently Active  ? Days of Exercise per Week: 2 days  ? Minutes of Exercise per Session: 30 min  ?Stress: No Stress Concern Present  ? Feeling of Stress : Only a little  ?Social Connections: Socially Integrated  ? Frequency of Communication with Friends and Family: More than three times a week  ? Frequency of Social Gatherings with Friends and Family: More than three times a week  ? Attends Religious Services: More than 4 times per year  ? Active Member of Clubs or Organizations: Yes  ? Attends Archivist Meetings: More than 4 times per year  ? Marital Status: Married  ?  Intimate Partner Violence: Not At Risk  ? Fear of Current or Ex-Partner: No  ? Emotionally Abused: No  ? Physically Abused: No  ? Sexually Abused: No  ?  ?Family History  ?Problem Relation Age of Onset  ? Diabetes Mother   ? Hyperlipidemia Mother   ? Hypertension Mother   ? Diabetes Father   ? Hyperlipidemia Father   ? Hypertension Father   ? Obesity Father   ? Hypertension Sister   ? Hyperlipidemia Sister   ? Hyperlipidemia Sister   ? Breast cancer Neg Hx   ? ?Allergies  ?Allergen Reactions  ? Lisinopril Swelling  ? ?I? ?Current Outpatient Medications  ?Medication Sig Dispense Refill  ? acetaminophen (TYLENOL) 650 MG CR tablet Take 650-1,300 mg by mouth 2 (two) times daily as needed for pain.    ? amiodarone (PACERONE) 200 MG tablet Take 200 mg by mouth 2 (two) times daily.    ? amoxicillin (AMOXIL) 500 MG capsule Take 1 capsule (500 mg total) by mouth 3 (three) times daily. Chronic suppressive therapy to start 10/27/2021 after completion of IV ceftriaxone on 10/26/2021.  To remain on amoxicillin suppressive therapy indefinitely until infectious disease says may stop 90 capsule 0  ? apixaban (ELIQUIS) 5 MG TABS tablet Take 1 tablet by mouth 2 (two) times daily.    ? ASPIRIN LOW DOSE 81 MG EC tablet TAKE 1 TABLET  BY MOUTH EVERY DAY 90 tablet 1  ? atorvastatin (LIPITOR) 40 MG tablet Take 1 tablet (40 mg total) by mouth every evening. 90 tablet 1  ? blood glucose meter kit and supplies 1 each by Other route dai

## 2021-12-17 NOTE — Progress Notes (Signed)
? ?Name: Becky Trevino   MRN: [id number]    DOB: 10-25-1945   Date:12/17/2021 ? ?     Progress Note ? ?Subjective ? ?Chief Complaint ? ?Chief Complaint  ?Patient presents with  ? Follow-up  ? ? ?I connected with  Becky Trevino on 12/17/21 at  1:40 PM EDT by telephone and verified that I am speaking with the correct person using two identifiers. ? ?I discussed the limitations, risks, security and privacy concerns of performing an evaluation and management service by telephone and the availability of in person appointments. Staff also discussed with the patient that there may be a patient responsible charge related to this service. Patient agreed on having a virtual visit  ?Patient Location: at home  ?Provider Location: [place] ?Additional Individuals present: husband  ? ?HPI ? ?Becky Trevino was seen by Dr. Ky Barban two weeks ago with complaints of drop in bp during PT, during her visit here her bp was normal, but she had rales on exam, BNP was slightly elevated , normal CXR and CBC showed improvement of anemia. Patient denies any SOB, using peak flow at home. She saw infectious disease provider today and had normal exam, bp during PT visits has been better, but bp today was slightly low at Dr. Debera Lat office. Advised her to keep a log of bp and bring with her during her visit  ? ?Patient Active Problem List  ? Diagnosis Date Noted  ? PAD (peripheral artery disease) ([place]) 09/27/2021  ? Cellulitis of left lower extremity 09/15/2021  ? Elevated troponin level not due myocardial infarction 09/15/2021  ? AF (paroxysmal atrial fibrillation) ([place]) 09/15/2021  ? Effusion of right hip 07/18/2021  ? Primary osteoarthritis of right hip 07/18/2021  ? Right hip joint effusion   ? Atherosclerosis of aorta ([place]) 06/07/2021  ? Anasarca   ? Hypothyroidism   ? Hypotension   ? Class 2 obesity without serious comorbidity with body mass index (BMI) of 39.0 to 39.9 in adult   ? Thrombocytopenia ([place])   ? Mild protein-calorie  malnutrition ([place])   ? Hyperbilirubinemia   ? Injury of left femoral nerve 08/28/2020  ? History of DVT (deep vein thrombosis) 06/25/2020  ? Lymphedema 06/25/2020  ? History of pelvic hematoma 02/10/2020  ? Foot ulcer, right, limited to breakdown of skin ([place]) 12/02/2019  ? Chronic venous hypertension due to deep vein thrombosis (DVT) 10/30/2019  ? Hematoma   ? Normocytic anemia 08/07/2019  ? Chronic infection of right knee ([place]) 11/25/2018  ? Infection and inflammatory reaction due to internal joint prosthesis, subsequent encounter 01/25/2018  ? Infection of total right knee replacement ([place]) 11/19/2017  ? Type 2 diabetes mellitus with stage 3b chronic kidney disease, without long-term current use of insulin ([place]) 11/06/2016  ? Primary osteoarthritis of knee 03/11/2016  ? Primary osteoarthritis of left hip 12/11/2015  ? Essential hypertension 04/21/2015  ? Edema leg 04/21/2015  ? Chronic kidney disease (CKD), stage III (moderate) ([place]) 04/21/2015  ? Chronic venous insufficiency 04/21/2015  ? DDD (degenerative disc disease), cervical 04/21/2015  ? Diabetes mellitus with renal manifestation ([place]) 04/21/2015  ? Hyperlipidemia 04/21/2015  ? Bladder cystocele 04/21/2015  ? Urinary incontinence 04/21/2015  ? Leg varices 04/21/2015  ? Insomnia 04/21/2015  ? Eczema intertrigo 04/21/2015  ? Obesity, Class III, BMI 40-49.9 (morbid obesity) ([place]) 04/21/2015  ? OSA (obstructive sleep apnea) 04/21/2015  ? Supraventricular premature beats 04/21/2015  ? Osteoarthritis of multiple joints 04/21/2015  ? H/O Spinal surgery 11/14/2013  ? Detrusor  muscle hypertonia 11/07/2013  ? ? ?Past Surgical History:  ?Procedure Laterality Date  ? ABDOMINAL HYSTERECTOMY  1993  ? Total  ? BUNIONECTOMY Bilateral 1993  ? I & D KNEE WITH POLY EXCHANGE Right 11/19/2017  ? Procedure: RIGHT KNEE POLY EXCHANGE WITH IRRIGATION AND DEBRIDEMENT;  Surgeon: Hessie Knows, MD;  Location: [facility];  Service: Orthopedics;  Laterality: Right;  ? IR FLUORO GUIDED  NEEDLE PLC ASPIRATION/INJECTION LOC  09/19/2021  ? JOINT REPLACEMENT    ? bilateral knee  ? LAMINECTOMY  11/14/2013  ?  Cervical Fusion , [facility], Dr. Delilah Shan  ? LASER ABLATION Bilateral 07/29/2012  ? Dr. Lucky Cowboy  ? LOWER EXTREMITY ANGIOGRAPHY Right 11/02/2019  ? Procedure: Lower Extremity Angiography;  Surgeon: Katha Cabal, MD;  Location: [place] [facility];  Service: Cardiovascular;  Laterality: Right;  ? ORIF ANKLE FRACTURE Right 09/02/2019  ? Procedure: OPEN REDUCTION INTERNAL FIXATION (ORIF) ANKLE FRACTURE BIMALLEOLAR;  Surgeon: [name], DPM;  Location: [facility];  Service: Podiatry;  Laterality: Right;  ? PERIPHERAL VASCULAR THROMBECTOMY Right 03/02/2020  ? Procedure: PERIPHERAL VASCULAR THROMBECTOMY;  Surgeon: Algernon Huxley, MD;  Location: [place] [facility];  Service: Cardiovascular;  Laterality: Right;  ? REPLACEMENT TOTAL KNEE Left 03/2010  ? Dr. Rudene Christians  ? TEE WITHOUT CARDIOVERSION N/A 09/19/2021  ? Procedure: TRANSESOPHAGEAL ECHOCARDIOGRAM (TEE);  Surgeon: Corey Skains, MD;  Location: [facility];  Service: Cardiovascular;  Laterality: N/A;  ? TOTAL HIP ARTHROPLASTY Left 12/11/2015  ? Procedure: TOTAL HIP ARTHROPLASTY ANTERIOR APPROACH;  Surgeon: Hessie Knows, MD;  Location: [facility];  Service: Orthopedics;  Laterality: Left;  ? TOTAL KNEE ARTHROPLASTY Right 03/11/2016  ? Procedure: TOTAL KNEE ARTHROPLASTY;  Surgeon: Hessie Knows, MD;  Location: [facility];  Service: Orthopedics;  Laterality: Right;  ? ? ?Family History  ?Problem Relation Age of Onset  ? Diabetes Mother   ? Hyperlipidemia Mother   ? Hypertension Mother   ? Diabetes Father   ? Hyperlipidemia Father   ? Hypertension Father   ? Obesity Father   ? Hypertension Sister   ? Hyperlipidemia Sister   ? Hyperlipidemia Sister   ? Breast cancer Neg Hx   ? ? ?Social History  ? ?Socioeconomic History  ? Marital status: Married  ?  Spouse name: Not on file  ? Number of children: 1  ? Years of education: Not on file  ? Highest education level: Not on file   ?Occupational History  ? Not on file  ?Tobacco Use  ? Smoking status: Never  ? Smokeless tobacco: Never  ?Vaping Use  ? Vaping Use: Never used  ?Substance and Sexual Activity  ? Alcohol use: No  ?  Alcohol/week: 0.0 standard drinks  ? Drug use: No  ? Sexual activity: Yes  ?  Partners: Male  ?Other Topics Concern  ? Not on file  ?Social History Narrative  ? Married, one adopted grown child   ? ?Social Determinants of Health  ? ?Financial Resource Strain: Low Risk   ? Difficulty of Paying Living Expenses: Not hard at all  ?Food Insecurity: No Food Insecurity  ? Worried About Charity fundraiser in the Last Year: Never true  ? Ran Out of Food in the Last Year: Never true  ?Transportation Needs: No Transportation Needs  ? Lack of Transportation (Medical): No  ? Lack of Transportation (Non-Medical): No  ?Physical Activity: Insufficiently Active  ? Days of Exercise per Week: 2 days  ? Minutes of Exercise per Session: 30 min  ?  Stress: No Stress Concern Present  ? Feeling of Stress : Only a little  ?Social Connections: Socially Integrated  ? Frequency of Communication with Friends and Family: More than three times a week  ? Frequency of Social Gatherings with Friends and Family: More than three times a week  ? Attends Religious Services: More than 4 times per year  ? Active Member of Clubs or Organizations: Yes  ? Attends Archivist Meetings: More than 4 times per year  ? Marital Status: Married  ?Intimate Partner Violence: Not At Risk  ? Fear of Current or Ex-Partner: No  ? Emotionally Abused: No  ? Physically Abused: No  ? Sexually Abused: No  ? ? ? ?Current Outpatient Medications:  ?  acetaminophen (TYLENOL) 650 MG CR tablet, Take 650-1,300 mg by mouth 2 (two) times daily as needed for pain., Disp: , Rfl:  ?  amiodarone (PACERONE) 200 MG tablet, Take 200 mg by mouth 2 (two) times daily., Disp: , Rfl:  ?  amoxicillin (AMOXIL) 500 MG capsule, Take 1 capsule (500 mg total) by mouth 3 (three) times daily. Chronic  suppressive therapy to start 10/27/2021 after completion of IV ceftriaxone on 10/26/2021.  To remain on amoxicillin suppressive therapy indefinitely until infectious disease says may stop, Disp: 90 capsule, Rfl: 0 ?  apixa

## 2021-12-17 NOTE — Patient Instructions (Signed)
You are here for follow up of the recurrent bacterial infection with Group B streptococcus- you have both knee replacement, left hip replacement , and rt ankle fracture with ORIF ?All the siutes look okay today ?You will continue indefinitely on amoxicllin - starting may you will take '500mg'$   twice a day ?Follow up 6 months ?

## 2021-12-23 ENCOUNTER — Other Ambulatory Visit: Payer: Self-pay | Admitting: Family Medicine

## 2021-12-23 DIAGNOSIS — F064 Anxiety disorder due to known physiological condition: Secondary | ICD-10-CM

## 2022-01-06 ENCOUNTER — Telehealth: Payer: Self-pay | Admitting: Family Medicine

## 2022-01-06 NOTE — Telephone Encounter (Signed)
Lysbeth Galas calling from [facility] was discharged today. Calling to get discharge orders. Please advise CB- [phone] ?

## 2022-01-07 NOTE — Telephone Encounter (Signed)
Left voicemail for Becky Trevino to clarify what orders are needed. Awaiting return call.  ?

## 2022-01-08 ENCOUNTER — Ambulatory Visit: Payer: Medicare Other | Admitting: Family Medicine

## 2022-01-08 NOTE — Telephone Encounter (Signed)
Lysbeth Galas from [facility] stated pt discharge was just from physical therapy.  ? ? call back if needed - [phone] ?

## 2022-01-13 ENCOUNTER — Telehealth: Payer: Self-pay

## 2022-01-13 NOTE — Telephone Encounter (Signed)
Patient called and left voicemail requesting refill of amoxicillan. Dr. Delaine Lame had already sent in prescription today; this RN called the patient and let her know the refills were available. Patient stated understanding and had no further questions. ? ?Binnie Kand, RN  ?

## 2022-01-15 ENCOUNTER — Other Ambulatory Visit: Payer: Self-pay | Admitting: Family Medicine

## 2022-01-17 NOTE — Progress Notes (Signed)
Name: Becky Trevino   MRN: [id number]    DOB: [birth year]   Date:01/27/2022 ? ?     Progress Note ? ?Subjective ? ?Chief Complaint ? ?Follow up  ? ?HPI ? ?DMII with renal manifestation and obesity:  she denies polyuria, polyphagia or polydipsia. A1C at goal   She has CKI - last GFR 43 , dyslipidemia and obesity . She cannot take ACE due to angioedema. She states statins for dyslipidemia. She has neuropathy and sees Podiatrist on a regular basis  ?  ?Obesity Morbid: she cannot stand up and weight was not checked today  ?  ?History of hypotension: She is now off midodrine and tolerating 25 mg of metoprolol, heart rate is at goal, she states she states no longer having dizziness like she used to BP is stable ?  ?Chronic DVT  right popliteal vein: diagnosed 10/27/2019,  she has an IV filter. Dr. Tasia Catchings had stopped Eliquis but had psoas hematoma but since resolution of hematoma Dr. Clayborn Bigness placed her back on it . She also has pulmonary hypertension ?  ?Hyperlipidemia: taking Atorvastatin daily and denies side effects. No chest pain , no muscles pain. Recheck level  ?  ?Insomnia/OSA :Last Echo done by Dr. Clayborn Bigness showed mild pulmonary hypertension . Reminded her to resume CPAP , her machine is old, we will send her for another sleep study  ?  ?OA: multiple joints, sees Ortho, Dr. Rudene Christians . She had revision of right knee replacement March 2019 because of infection. Still under the care of Ortho now also seeing Dr. Ola Spurr - ID. She is still on daily antibiotics - Amoxicillin off Rocephin  ?  ?Left femoral nerve palsy:  seen by Dr. Rudene Christians, Dr. Manuella Ghazi, now seeing neurosurgeon at [facility] , Dr. Tamala Julian. She is able to walk short distances but s wearing brace because left knee is unstable due to femoral nerve palsy. She still  needs assistance bathing, cannot drive, cannot transfer alone . She was diagnosed with a hematoma of left iliopsoas during hospital stay 10/2019 , but she had repeat  CT pelvis 01/09/2020 that showed resolution of  hematoma. She is back on Eliquis and off aspirin now  Dr. Tamala Julian reviewed her case and advised against doing the surgery to decompress the nerve.  ?  ?CKI stage III: GFR around 50's normal urine micro , not on ace at this time. BP is towards low end of normal, we will recheck levels, denies pruritus.  ?  ?Atherosclerosis of Aorta: she is on statin and back on Eliquis Denies side effects We will recheck labs  ? ?Hypothyroidism: on levothyroxine and we will recheck TSH ? ?Anxiety: she was placed on lexapro due to episodes of panic attack, doing well now  ? ?Patient Active Problem List  ? Diagnosis Date Noted  ? Hypothyroidism (acquired) 01/27/2022  ? PAD (peripheral artery disease) ([place]) 09/27/2021  ? AF (paroxysmal atrial fibrillation) ([place]) 09/15/2021  ? Primary osteoarthritis of right hip 07/18/2021  ? Atherosclerosis of aorta ([place]) 06/07/2021  ? Mild protein-calorie malnutrition ([place])   ? Hyperbilirubinemia   ? Injury of left femoral nerve 08/28/2020  ? Lymphedema 06/25/2020  ? History of pelvic hematoma 02/10/2020  ? Chronic venous hypertension due to deep vein thrombosis (DVT) 10/30/2019  ? Normocytic anemia 08/07/2019  ? Chronic infection of right knee ([place]) 11/25/2018  ? Infection and inflammatory reaction due to internal joint prosthesis, subsequent encounter 01/25/2018  ? Infection of total right knee replacement ([place]) 11/19/2017  ? Type 2 diabetes  mellitus with stage 3b chronic kidney disease, without long-term current use of insulin ([place]) 11/06/2016  ? Primary osteoarthritis of knee 03/11/2016  ? Primary osteoarthritis of left hip 12/11/2015  ? Essential hypertension 04/21/2015  ? Edema leg 04/21/2015  ? Chronic kidney disease (CKD), stage III (moderate) ([place]) 04/21/2015  ? Chronic venous insufficiency 04/21/2015  ? DDD (degenerative disc disease), cervical 04/21/2015  ? Diabetes mellitus with renal manifestation ([place]) 04/21/2015  ? Hyperlipidemia 04/21/2015  ? Bladder cystocele 04/21/2015  ? Urinary  incontinence 04/21/2015  ? Leg varices 04/21/2015  ? Insomnia 04/21/2015  ? Eczema intertrigo 04/21/2015  ? Obesity, Class III, BMI 40-49.9 (morbid obesity) ([place]) 04/21/2015  ? OSA (obstructive sleep apnea) 04/21/2015  ? Osteoarthritis of multiple joints 04/21/2015  ? H/O Spinal surgery 11/14/2013  ? Detrusor muscle hypertonia 11/07/2013  ? ? ?Past Surgical History:  ?Procedure Laterality Date  ? ABDOMINAL HYSTERECTOMY  1993  ? Total  ? BUNIONECTOMY Bilateral 1993  ? I & D KNEE WITH POLY EXCHANGE Right 11/19/2017  ? Procedure: RIGHT KNEE POLY EXCHANGE WITH IRRIGATION AND DEBRIDEMENT;  Surgeon: Hessie Knows, MD;  Location: [facility];  Service: Orthopedics;  Laterality: Right;  ? IR FLUORO GUIDED NEEDLE PLC ASPIRATION/INJECTION LOC  09/19/2021  ? JOINT REPLACEMENT    ? bilateral knee  ? LAMINECTOMY  11/14/2013  ?  Cervical Fusion , [facility], Dr. Delilah Shan  ? LASER ABLATION Bilateral 07/29/2012  ? Dr. Lucky Cowboy  ? LOWER EXTREMITY ANGIOGRAPHY Right 11/02/2019  ? Procedure: Lower Extremity Angiography;  Surgeon: Katha Cabal, MD;  Location: [place] [facility];  Service: Cardiovascular;  Laterality: Right;  ? ORIF ANKLE FRACTURE Right 09/02/2019  ? Procedure: OPEN REDUCTION INTERNAL FIXATION (ORIF) ANKLE FRACTURE BIMALLEOLAR;  Surgeon: [name], DPM;  Location: [facility];  Service: Podiatry;  Laterality: Right;  ? PERIPHERAL VASCULAR THROMBECTOMY Right 03/02/2020  ? Procedure: PERIPHERAL VASCULAR THROMBECTOMY;  Surgeon: Algernon Huxley, MD;  Location: [place] [facility];  Service: Cardiovascular;  Laterality: Right;  ? REPLACEMENT TOTAL KNEE Left 03/2010  ? Dr. Rudene Christians  ? TEE WITHOUT CARDIOVERSION N/A 09/19/2021  ? Procedure: TRANSESOPHAGEAL ECHOCARDIOGRAM (TEE);  Surgeon: Corey Skains, MD;  Location: [facility];  Service: Cardiovascular;  Laterality: N/A;  ? TOTAL HIP ARTHROPLASTY Left 12/11/2015  ? Procedure: TOTAL HIP ARTHROPLASTY ANTERIOR APPROACH;  Surgeon: Hessie Knows, MD;  Location: [facility];  Service: Orthopedics;  Laterality:  Left;  ? TOTAL KNEE ARTHROPLASTY Right 03/11/2016  ? Procedure: TOTAL KNEE ARTHROPLASTY;  Surgeon: Hessie Knows, MD;  Location: [facility];  Service: Orthopedics;  Laterality: Right;  ? ? ?Family History  ?Problem Relation Age of Onset  ? Diabetes Mother   ? Hyperlipidemia Mother   ? Hypertension Mother   ? Diabetes Father   ? Hyperlipidemia Father   ? Hypertension Father   ? Obesity Father   ? Hypertension Sister   ? Hyperlipidemia Sister   ? Hyperlipidemia Sister   ? Breast cancer Neg Hx   ? ? ?Social History  ? ?Tobacco Use  ? Smoking status: Never  ? Smokeless tobacco: Never  ?Substance Use Topics  ? Alcohol use: No  ?  Alcohol/week: 0.0 standard drinks  ? ? ? ?Current Outpatient Medications:  ?  ACCU-CHEK GUIDE test strip, TEST ONCE A DAY AS DIRECTED, Disp: 50 strip, Rfl: 1 ?  acetaminophen (TYLENOL) 650 MG CR tablet, Take 650-1,300 mg by mouth 2 (two) times daily as needed for pain., Disp: , Rfl:  ?  amiodarone (PACERONE) 200 MG tablet,  Take 200 mg by mouth 2 (two) times daily., Disp: , Rfl:  ?  apixaban (ELIQUIS) 5 MG TABS tablet, Take 1 tablet by mouth 2 (two) times daily., Disp: , Rfl:  ?  blood glucose meter kit and supplies, 1 each by Other route daily at 12 noon. Dispense based on patient and insurance preference. Check fsbs once a day, Disp: 1 each, Rfl: 0 ?  Calcium Carbonate-Vitamin D 600-400 MG-UNIT tablet, Take 1 tablet by mouth 2 (two) times daily., Disp: 60 tablet, Rfl: 0 ?  fluticasone (FLONASE) 50 MCG/ACT nasal spray, SPRAY 2 SPRAYS INTO EACH NOSTRIL EVERY DAY, Disp: 48 mL, Rfl: 0 ?  latanoprost (XALATAN) 0.005 % ophthalmic solution, SMARTSIG:In Eye(s), Disp: , Rfl:  ?  Magnesium Oxide 500 MG CAPS, Take 500 mg by mouth daily. , Disp: , Rfl:  ?  metoprolol succinate (TOPROL-XL) 50 MG 24 hr tablet, Take 50 mg by mouth daily., Disp: , Rfl:  ?  mirabegron ER (MYRBETRIQ) 50 MG TB24 tablet, Take 1 tablet (50 mg total) by mouth daily., Disp: 90 tablet, Rfl: 3 ?  Oxycodone HCl 10 MG TABS, Take 1 tablet by  mouth 4 (four) times daily., Disp: , Rfl:  ?  polyethylene glycol (MIRALAX / GLYCOLAX) 17 g packet, Take 17 g by mouth daily., Disp: 14 each, Rfl: 0 ?  potassium chloride (KLOR-CON) 10 MEQ tablet, Take 10 mEq

## 2022-01-27 ENCOUNTER — Ambulatory Visit (INDEPENDENT_AMBULATORY_CARE_PROVIDER_SITE_OTHER): Payer: Medicare Other | Admitting: Family Medicine

## 2022-01-27 ENCOUNTER — Encounter: Payer: Self-pay | Admitting: Family Medicine

## 2022-01-27 VITALS — BP 116/72 | HR 62 | Temp 97.7°F | Resp 18 | Ht 71.0 in | Wt 320.0 lb

## 2022-01-27 DIAGNOSIS — I700 Atherosclerosis of aorta: Secondary | ICD-10-CM

## 2022-01-27 DIAGNOSIS — E039 Hypothyroidism, unspecified: Secondary | ICD-10-CM

## 2022-01-27 DIAGNOSIS — F419 Anxiety disorder, unspecified: Secondary | ICD-10-CM

## 2022-01-27 DIAGNOSIS — E1122 Type 2 diabetes mellitus with diabetic chronic kidney disease: Secondary | ICD-10-CM

## 2022-01-27 DIAGNOSIS — I480 Paroxysmal atrial fibrillation: Secondary | ICD-10-CM

## 2022-01-27 DIAGNOSIS — E1140 Type 2 diabetes mellitus with diabetic neuropathy, unspecified: Secondary | ICD-10-CM | POA: Diagnosis not present

## 2022-01-27 DIAGNOSIS — E785 Hyperlipidemia, unspecified: Secondary | ICD-10-CM

## 2022-01-27 DIAGNOSIS — R269 Unspecified abnormalities of gait and mobility: Secondary | ICD-10-CM

## 2022-01-27 DIAGNOSIS — E538 Deficiency of other specified B group vitamins: Secondary | ICD-10-CM | POA: Diagnosis not present

## 2022-01-27 DIAGNOSIS — I272 Pulmonary hypertension, unspecified: Secondary | ICD-10-CM

## 2022-01-27 DIAGNOSIS — T8453XS Infection and inflammatory reaction due to internal right knee prosthesis, sequela: Secondary | ICD-10-CM

## 2022-01-27 DIAGNOSIS — I10 Essential (primary) hypertension: Secondary | ICD-10-CM

## 2022-01-27 DIAGNOSIS — N1832 Chronic kidney disease, stage 3b: Secondary | ICD-10-CM | POA: Diagnosis not present

## 2022-01-27 DIAGNOSIS — I87099 Postthrombotic syndrome with other complications of unspecified lower extremity: Secondary | ICD-10-CM

## 2022-01-27 DIAGNOSIS — G4733 Obstructive sleep apnea (adult) (pediatric): Secondary | ICD-10-CM

## 2022-01-27 DIAGNOSIS — F5101 Primary insomnia: Secondary | ICD-10-CM

## 2022-01-27 DIAGNOSIS — M009 Pyogenic arthritis, unspecified: Secondary | ICD-10-CM

## 2022-01-27 LAB — POCT GLYCOSYLATED HEMOGLOBIN (HGB A1C): Hemoglobin A1C: 5.5 % (ref 4.0–5.6)

## 2022-01-27 MED ORDER — ATORVASTATIN CALCIUM 40 MG PO TABS: 40.0000 mg | ORAL_TABLET | Freq: Every evening | ORAL | 1 refills | Status: DC

## 2022-01-27 MED ORDER — ESCITALOPRAM OXALATE 5 MG PO TABS: 5.0000 mg | ORAL_TABLET | Freq: Every day | ORAL | 1 refills | Status: DC

## 2022-01-27 NOTE — Assessment & Plan Note (Signed)
Diet controlled now ?Off ARB but to hypotension  ?

## 2022-01-27 NOTE — Assessment & Plan Note (Signed)
On statin therapy 

## 2022-01-27 NOTE — Assessment & Plan Note (Signed)
Under the care of infection disease and Ortho - Dr. Rudene Christians ?

## 2022-01-27 NOTE — Assessment & Plan Note (Signed)
On wheelchair and weight not checked today ?

## 2022-01-27 NOTE — Patient Instructions (Signed)
Weight loss medication ?Contact insurance to see if covered ?[place]  ?

## 2022-01-27 NOTE — Assessment & Plan Note (Signed)
Had sleep study many years ago and needs to have it repeated, also has Pulmonary hypertension  ?

## 2022-01-28 ENCOUNTER — Telehealth: Payer: Self-pay

## 2022-01-28 ENCOUNTER — Other Ambulatory Visit: Payer: Self-pay | Admitting: Family Medicine

## 2022-01-28 DIAGNOSIS — E1140 Type 2 diabetes mellitus with diabetic neuropathy, unspecified: Secondary | ICD-10-CM

## 2022-01-28 LAB — LIPID PANEL
Cholesterol: 122 mg/dL (ref <200)
HDL: 48 mg/dL — ABNORMAL LOW (ref >=50)
LDL Cholesterol (Calc): 61 mg/dL (calc)
Non-HDL Cholesterol (Calc): 74 mg/dL (calc) (ref <130)
Total CHOL/HDL Ratio: 2.5 (calc) (ref <5.0)
Triglycerides: 47 mg/dL (ref <150)

## 2022-01-28 LAB — COMPLETE METABOLIC PANEL WITH GFR
AG Ratio: 0.7 (calc) — ABNORMAL LOW (ref 1.0–2.5)
ALT: 34 U/L — ABNORMAL HIGH (ref 6–29)
AST: 32 U/L (ref 10–35)
Albumin: 3.5 g/dL — ABNORMAL LOW (ref 3.6–5.1)
Alkaline phosphatase (APISO): 100 U/L (ref 37–153)
BUN/Creatinine Ratio: 16 (calc) (ref 6–22)
BUN: 24 mg/dL (ref 7–25)
CO2: 28 mmol/L (ref 20–32)
Calcium: 8.9 mg/dL (ref 8.6–10.4)
Chloride: 103 mmol/L (ref 98–110)
Creat: 1.47 mg/dL — ABNORMAL HIGH (ref 0.60–1.00)
Globulin: 4.9 g/dL (calc) — ABNORMAL HIGH (ref 1.9–3.7)
Glucose, Bld: 77 mg/dL (ref 65–99)
Potassium: 4.6 mmol/L (ref 3.5–5.3)
Sodium: 139 mmol/L (ref 135–146)
Total Bilirubin: 0.6 mg/dL (ref 0.2–1.2)
Total Protein: 8.4 g/dL — ABNORMAL HIGH (ref 6.1–8.1)
eGFR: 37 mL/min/{1.73_m2} — ABNORMAL LOW (ref >=60)

## 2022-01-28 LAB — TSH: TSH: 10.79 mIU/L — ABNORMAL HIGH (ref 0.40–4.50)

## 2022-01-28 MED ORDER — OZEMPIC (0.25 OR 0.5 MG/DOSE) 2 MG/1.5ML ~~LOC~~ SOPN: 0.2500 mg | PEN_INJECTOR | SUBCUTANEOUS | 0 refills | Status: DC

## 2022-01-28 NOTE — Telephone Encounter (Signed)
Pt called in wanting to know the name of the medication that you guys discussed pertaining to weight loss. She said it was a shot. Please return call

## 2022-01-30 ENCOUNTER — Other Ambulatory Visit: Payer: Self-pay | Admitting: Family Medicine

## 2022-01-31 ENCOUNTER — Other Ambulatory Visit: Payer: Self-pay | Admitting: Family Medicine

## 2022-01-31 MED ORDER — OZEMPIC (0.25 OR 0.5 MG/DOSE) 2 MG/3ML ~~LOC~~ SOPN: 0.5000 mg | PEN_INJECTOR | SUBCUTANEOUS | 0 refills | Status: DC

## 2022-02-06 ENCOUNTER — Other Ambulatory Visit: Payer: Self-pay | Admitting: Family Medicine

## 2022-02-06 DIAGNOSIS — E039 Hypothyroidism, unspecified: Secondary | ICD-10-CM

## 2022-02-06 LAB — MICROALBUMIN / CREATININE URINE RATIO
Creatinine, Urine: 107 mg/dL (ref 20–275)
Microalb Creat Ratio: 2 mcg/mg creat (ref <30)
Microalb, Ur: 0.2 mg/dL

## 2022-02-06 MED ORDER — LEVOTHYROXINE SODIUM 25 MCG PO TABS: 25.0000 ug | ORAL_TABLET | Freq: Every day | ORAL | 0 refills | Status: DC

## 2022-02-07 ENCOUNTER — Telehealth: Payer: Self-pay

## 2022-02-07 NOTE — Telephone Encounter (Signed)
Ozempic will cost the patient $700, But it will cover Mounjaro for $40 for a month supply. If you switch it over to [facility] Is there a coupon to help the patient out?

## 2022-02-11 NOTE — Telephone Encounter (Signed)
Spoke with pt and she has decided to hold off on the ozempic and the mounjaro.

## 2022-02-17 ENCOUNTER — Telehealth: Payer: Self-pay | Admitting: Family Medicine

## 2022-02-17 NOTE — Telephone Encounter (Signed)
Becky Trevino called from [facility] requesting a call back. She says they are missing which sleep study type + signed orders.   Best contact: [phone]  Fax: [phone]

## 2022-02-17 NOTE — Telephone Encounter (Signed)
Called number given. Clarified it's an in-home study. Waiting for orders to be faxed to sign. I have not received them yet.

## 2022-02-26 ENCOUNTER — Telehealth (INDEPENDENT_AMBULATORY_CARE_PROVIDER_SITE_OTHER): Payer: Self-pay | Admitting: Nurse Practitioner

## 2022-02-26 NOTE — Telephone Encounter (Signed)
Pt. Called wanting to speak to fb regarding some diva boots that she is supposed to have made.  She can be reached at [phone].

## 2022-02-26 NOTE — Telephone Encounter (Signed)
I spoke with [employer] and informed that the patient will be to come in for update h&p to move forward with getting lymphedema pump. I left a detailed message on the patient voicemail to contact the office to schedule her appointment

## 2022-03-04 ENCOUNTER — Ambulatory Visit: Payer: Self-pay | Admitting: *Deleted

## 2022-03-04 NOTE — Telephone Encounter (Signed)
Summary: swollen legs and knee   Patient states her legs/knees are swollen x2w, pain in lt knee, patient states she has been taking fluid pills and it is not helping   Please follow up w/ patient      Chief Complaint: fluid overload Symptoms: swollen legs Frequency: for 2.5 weeks Pertinent Negatives: Patient denies SOB, fever Disposition: '[x]'$ ED /'[]'$ Urgent Care (no appt availability in office) / '[x]'$ Appointment(In office/virtual)/ '[]'$  [facility]/ '[]'$ Home Care/ '[]'$ Refused Recommended Disposition /'[x]'$ [place] [facility] [place] [facility]/ '[]'$  Follow-up with PCP Additional Notes: There is appt tomorrow which I recommended but pt wants to wait for appt Friday due to ride situation. The [facility] is booked for one month out. She is in a wheel chair and must ride the [place]. I will keep the appt for Friday and see if she can work something else out. If not I will cancel it tomorrow. Pt has also reached out to ortho, awaiting reply. Pt's daughter present and calling EMS.  Reason for Disposition  [1] MODERATE leg swelling (e.g., swelling extends up to knees) AND [2] new-onset or worsening  Answer Assessment - Initial Assessment Questions 1. ONSET: "When did the swelling start?" (e.g., minutes, hours, days)     2.5 weeks 2. LOCATION: "What part of the leg is swollen?"  "Are both legs swollen or just one leg?"     From knees up. 3. SEVERITY: "How bad is the swelling?" (e.g., localized; mild, moderate, severe)  - Localized - small area of swelling localized to one leg  - MILD pedal edema - swelling limited to foot and ankle, pitting edema < 1/4 inch (6 mm) deep, rest and elevation eliminate most or all swelling  - MODERATE edema - swelling of lower leg to knee, pitting edema > 1/4 inch (6 mm) deep, rest and elevation only partially reduce swelling  - SEVERE edema - swelling extends above knee, facial or hand swelling present      Not pitting, moderate swelling 4. REDNESS: "Does the swelling look red or infected?"      no 5. PAIN: "Is the swelling painful to touch?" If Yes, ask: "How painful is it?"   (Scale 1-10; mild, moderate or severe)     A little bit on the left leg. Right leg feels sore too. 6. FEVER: "Do you have a fever?" If Yes, ask: "What is it, how was it measured, and when did it start?"      no 7. CAUSE: "What do you think is causing the leg swelling?"     Fluid build up.  8. MEDICAL HISTORY: "Do you have a history of heart failure, kidney disease, liver failure, or cancer?"     CHF 9. RECURRENT SYMPTOM: "Have you had leg swelling before?" If Yes, ask: "When was the last time?" "What happened that time?"     yes 10. OTHER SYMPTOMS: "Do you have any other symptoms?" (e.g., chest pain, difficulty breathing)       If walk up stairs 11. PREGNANCY: "Is there any chance you are pregnant?" "When was your last menstrual period?"       no  Protocols used: Leg Swelling and Edema-A-AH

## 2022-03-05 NOTE — Telephone Encounter (Signed)
Called pt and let her know what Dr. Ancil Boozer said. She said spoke to them Monday- they're working on getting the pump. Pt gave verbal understanding to reach out to vascular for a potential UNA boot until then.

## 2022-03-06 ENCOUNTER — Telehealth: Payer: Self-pay | Admitting: Family Medicine

## 2022-03-06 DIAGNOSIS — F064 Anxiety disorder due to known physiological condition: Secondary | ICD-10-CM

## 2022-03-06 NOTE — Progress Notes (Deleted)
Name: Becky Trevino   MRN: [id number]    DOB: [birth year]   Date:03/06/2022       Progress Note  Subjective  Chief Complaint  Swollen Legs  HPI  *** Patient Active Problem List   Diagnosis Date Noted   Hypothyroidism (acquired) 01/27/2022   PAD (peripheral artery disease) ([place]) 09/27/2021   AF (paroxysmal atrial fibrillation) ([place]) 09/15/2021   Primary osteoarthritis of right hip 07/18/2021   Atherosclerosis of aorta ([place]) 06/07/2021   Mild protein-calorie malnutrition ([place])    Hyperbilirubinemia    Injury of left femoral nerve 08/28/2020   Lymphedema 06/25/2020   History of pelvic hematoma 02/10/2020   Chronic venous hypertension due to deep vein thrombosis (DVT) 10/30/2019   Normocytic anemia 08/07/2019   Chronic infection of right knee ([place]) 11/25/2018   Infection and inflammatory reaction due to internal joint prosthesis, subsequent encounter 01/25/2018   Infection of total right knee replacement ([place]) 11/19/2017   Type 2 diabetes mellitus with stage 3b chronic kidney disease, without long-term current use of insulin ([place]) 11/06/2016   Primary osteoarthritis of knee 03/11/2016   Primary osteoarthritis of left hip 12/11/2015   Essential hypertension 04/21/2015   Edema leg 04/21/2015   Chronic kidney disease (CKD), stage III (moderate) ([place]) 04/21/2015   Chronic venous insufficiency 04/21/2015   DDD (degenerative disc disease), cervical 04/21/2015   Diabetes mellitus with renal manifestation ([place]) 04/21/2015   Hyperlipidemia 04/21/2015   Bladder cystocele 04/21/2015   Urinary incontinence 04/21/2015   Leg varices 04/21/2015   Insomnia 04/21/2015   Eczema intertrigo 04/21/2015   Obesity, Class III, BMI 40-49.9 (morbid obesity) ([place]) 04/21/2015   OSA (obstructive sleep apnea) 04/21/2015   Osteoarthritis of multiple joints 04/21/2015   H/O Spinal surgery 11/14/2013   Detrusor muscle hypertonia 11/07/2013    Past Surgical History:  Procedure Laterality Date    ABDOMINAL HYSTERECTOMY  1993   Total   BUNIONECTOMY Bilateral [place] Right 11/19/2017   Procedure: RIGHT KNEE POLY EXCHANGE WITH IRRIGATION AND DEBRIDEMENT;  Surgeon: Hessie Knows, MD;  Location: [facility];  Service: Orthopedics;  Laterality: Right;   IR FLUORO GUIDED NEEDLE PLC ASPIRATION/INJECTION LOC  09/19/2021   JOINT REPLACEMENT     bilateral knee   LAMINECTOMY  11/14/2013    Cervical Fusion , [facility], Dr. Delilah Shan   LASER ABLATION Bilateral 07/29/2012   Dr. Lucky Cowboy   LOWER EXTREMITY ANGIOGRAPHY Right 11/02/2019   Procedure: Lower Extremity Angiography;  Surgeon: Katha Cabal, MD;  Location: [place] [facility];  Service: Cardiovascular;  Laterality: Right;   ORIF ANKLE FRACTURE Right 09/02/2019   Procedure: OPEN REDUCTION INTERNAL FIXATION (ORIF) ANKLE FRACTURE BIMALLEOLAR;  Surgeon: [name], DPM;  Location: [facility];  Service: Podiatry;  Laterality: Right;   PERIPHERAL VASCULAR THROMBECTOMY Right 03/02/2020   Procedure: PERIPHERAL VASCULAR THROMBECTOMY;  Surgeon: Algernon Huxley, MD;  Location: [place] [facility];  Service: Cardiovascular;  Laterality: Right;   REPLACEMENT TOTAL KNEE Left 03/2010   Dr. Rudene Christians   TEE WITHOUT CARDIOVERSION N/A 09/19/2021   Procedure: TRANSESOPHAGEAL ECHOCARDIOGRAM (TEE);  Surgeon: Corey Skains, MD;  Location: [facility];  Service: Cardiovascular;  Laterality: N/A;   TOTAL HIP ARTHROPLASTY Left 12/11/2015   Procedure: TOTAL HIP ARTHROPLASTY ANTERIOR APPROACH;  Surgeon: Hessie Knows, MD;  Location: [facility];  Service: Orthopedics;  Laterality: Left;   TOTAL KNEE ARTHROPLASTY Right 03/11/2016   Procedure: TOTAL KNEE ARTHROPLASTY;  Surgeon: Hessie Knows, MD;  Location: [facility];  Service: Orthopedics;  Laterality: Right;    Family History  Problem Relation Age of Onset   Diabetes Mother    Hyperlipidemia Mother    Hypertension Mother    Diabetes Father    Hyperlipidemia Father    Hypertension Father    Obesity Father     Hypertension Sister    Hyperlipidemia Sister    Hyperlipidemia Sister    Breast cancer Neg Hx     Social History   Tobacco Use   Smoking status: Never   Smokeless tobacco: Never  Substance Use Topics   Alcohol use: No    Alcohol/week: 0.0 standard drinks of alcohol     Current Outpatient Medications:    ACCU-CHEK GUIDE test strip, TEST ONCE A DAY AS DIRECTED, Disp: 50 strip, Rfl: 1   acetaminophen (TYLENOL) 650 MG CR tablet, Take 650-1,300 mg by mouth 2 (two) times daily as needed for pain., Disp: , Rfl:    amiodarone (PACERONE) 200 MG tablet, Take 200 mg by mouth 2 (two) times daily., Disp: , Rfl:    apixaban (ELIQUIS) 5 MG TABS tablet, Take 1 tablet by mouth 2 (two) times daily., Disp: , Rfl:    atorvastatin (LIPITOR) 40 MG tablet, Take 1 tablet (40 mg total) by mouth every evening., Disp: 90 tablet, Rfl: 1   blood glucose meter kit and supplies, 1 each by Other route daily at 12 noon. Dispense based on patient and insurance preference. Check fsbs once a day, Disp: 1 each, Rfl: 0   Calcium Carbonate-Vitamin D 600-400 MG-UNIT tablet, Take 1 tablet by mouth 2 (two) times daily., Disp: 60 tablet, Rfl: 0   escitalopram (LEXAPRO) 5 MG tablet, Take 1 tablet (5 mg total) by mouth daily., Disp: 90 tablet, Rfl: 1   fluticasone (FLONASE) 50 MCG/ACT nasal spray, SPRAY 2 SPRAYS INTO EACH NOSTRIL EVERY DAY, Disp: 48 mL, Rfl: 0   latanoprost (XALATAN) 0.005 % ophthalmic solution, SMARTSIG:In Eye(s), Disp: , Rfl:    levothyroxine (SYNTHROID) 25 MCG tablet, Take 1-2 tablets (25-50 mcg total) by mouth daily. Take one daily and twice a week take 2 pills and recheck in 6 weeks, Disp: 100 tablet, Rfl: 0   Magnesium Oxide 500 MG CAPS, Take 500 mg by mouth daily. , Disp: , Rfl:    metoprolol succinate (TOPROL-XL) 50 MG 24 hr tablet, Take 50 mg by mouth daily., Disp: , Rfl:    mirabegron ER (MYRBETRIQ) 50 MG TB24 tablet, Take 1 tablet (50 mg total) by mouth daily., Disp: 90 tablet, Rfl: 3   Oxycodone HCl  10 MG TABS, Take 1 tablet by mouth 4 (four) times daily., Disp: , Rfl:    polyethylene glycol (MIRALAX / GLYCOLAX) 17 g packet, Take 17 g by mouth daily., Disp: 14 each, Rfl: 0   potassium chloride (KLOR-CON) 10 MEQ tablet, Take 10 mEq by mouth daily., Disp: , Rfl:    Semaglutide,0.25 or 0.5MG/DOS, (OZEMPIC, 0.25 OR 0.5 MG/DOSE,) 2 MG/3ML SOPN, Inject 0.5 mg into the skin once a week., Disp: 3 mL, Rfl: 0   SIMBRINZA 1-0.2 % SUSP, Place 1 drop into both eyes 2 (two) times daily., Disp: , Rfl:    sodium chloride (OCEAN) 0.65 % SOLN nasal spray, Place 1 spray into both nostrils 3 (three) times daily., Disp: 30 mL, Rfl: 0   torsemide (DEMADEX) 20 MG tablet, Take 20 mg by mouth daily., Disp: , Rfl:    traZODone (DESYREL) 50 MG tablet, Take 0.5-1 tablets (25-50 mg total) by mouth at bedtime as  needed. for sleep, Disp: 90 tablet, Rfl: 0  Allergies  Allergen Reactions   Lisinopril Swelling    I personally reviewed active problem list, medication list, allergies, family history, social history, health maintenance with the patient/caregiver today.   ROS  ***  Objective  There were no vitals filed for this visit.  There is no height or weight on file to calculate BMI.  Physical Exam ***  Recent Results (from the past 2160 hour(s))  HM DIABETES FOOT EXAM     Status: None   Collection Time: 12/13/21 12:00 AM  Result Value Ref Range   HM Diabetic Foot Exam Completed     Comment: podiatry/[facility]  POCT HgB A1C     Status: Normal   Collection Time: 01/27/22 11:47 AM  Result Value Ref Range   Hemoglobin A1C 5.5 4.0 - 5.6 %   HbA1c POC (<> result, manual entry)     HbA1c, POC (prediabetic range)     HbA1c, POC (controlled diabetic range)    Lipid panel     Status: Abnormal   Collection Time: 01/27/22 12:16 PM  Result Value Ref Range   Cholesterol 122 <200 mg/dL   HDL 48 (L) > OR = 50 mg/dL   Triglycerides 47 <150 mg/dL   LDL Cholesterol (Calc) 61 mg/dL (calc)    Comment: Reference range:  <100 . Desirable range <100 mg/dL for primary prevention;   <70 mg/dL for patients with CHD or diabetic patients  with > or = 2 CHD risk factors. Marland Kitchen LDL-C is now calculated using the Martin-Hopkins  calculation, which is a validated novel method providing  better accuracy than the Friedewald equation in the  estimation of LDL-C.  Cresenciano Genre et al. Annamaria Helling. 2595;638(75): 2061-2068  (http://education.[employer].com/faq/FAQ164)    Total CHOL/HDL Ratio 2.5 <5.0 (calc)   Non-HDL Cholesterol (Calc) 74 <130 mg/dL (calc)    Comment: For patients with diabetes plus 1 major ASCVD risk  factor, treating to a non-HDL-C goal of <100 mg/dL  (LDL-C of <70 mg/dL) is considered a therapeutic  option.   COMPLETE METABOLIC PANEL WITH GFR     Status: Abnormal   Collection Time: 01/27/22 12:16 PM  Result Value Ref Range   Glucose, Bld 77 65 - 99 mg/dL    Comment: .            Fasting reference interval .    BUN 24 7 - 25 mg/dL   Creat 1.47 (H) 0.60 - 1.00 mg/dL   eGFR 37 (L) > OR = 60 mL/min/1.35m    Comment: The eGFR is based on the CKD-EPI 2021 equation. To calculate  the new eGFR from a previous Creatinine or Cystatin C result, go to https://www.kidney.org/professionals/ kdoqi/gfr%5Fcalculator    BUN/Creatinine Ratio 16 6 - 22 (calc)   Sodium 139 135 - 146 mmol/L   Potassium 4.6 3.5 - 5.3 mmol/L   Chloride 103 98 - 110 mmol/L   CO2 28 20 - 32 mmol/L   Calcium 8.9 8.6 - 10.4 mg/dL   Total Protein 8.4 (H) 6.1 - 8.1 g/dL   Albumin 3.5 (L) 3.6 - 5.1 g/dL   Globulin 4.9 (H) 1.9 - 3.7 g/dL (calc)   AG Ratio 0.7 (L) 1.0 - 2.5 (calc)   Total Bilirubin 0.6 0.2 - 1.2 mg/dL   Alkaline phosphatase (APISO) 100 37 - 153 U/L   AST 32 10 - 35 U/L   ALT 34 (H) 6 - 29 U/L  TSH     Status: Abnormal  Collection Time: 01/27/22 12:16 PM  Result Value Ref Range   TSH 10.79 (H) 0.40 - 4.50 mIU/L  Microalbumin / creatinine urine ratio     Status: None   Collection Time: 02/05/22  8:00 AM  Result  Value Ref Range   Creatinine, Urine 107 20 - 275 mg/dL   Microalb, Ur 0.2 mg/dL    Comment: Reference Range Not established    Microalb Creat Ratio 2 <30 mcg/mg creat    Comment: . The ADA defines abnormalities in albumin excretion as follows: Marland Kitchen Albuminuria Category        Result (mcg/mg creatinine) . Normal to Mildly increased   <30 Moderately increased         30-299  Severely increased           > OR = 300 . The ADA recommends that at least two of three specimens collected within a 3-6 month period be abnormal before considering a patient to be within a diagnostic category.     PHQ2/9:    01/27/2022   10:24 AM 12/17/2021   12:59 PM 12/17/2021   10:53 AM 12/03/2021    3:11 PM 10/28/2021    1:03 PM  Depression screen PHQ 2/9  Decreased Interest 0 0 0 0 0  Down, Depressed, Hopeless 0 0 0 0 0  PHQ - 2 Score 0 0 0 0 0  Altered sleeping 0 0 0 0 0  Tired, decreased energy 0 0 0 0 0  Change in appetite 0 0 0 0 0  Feeling bad or failure about yourself  0 0 0 0 0  Trouble concentrating 0 0 0 0 0  Moving slowly or fidgety/restless 0 0 0 0 0  Suicidal thoughts 0 0 0 0 0  PHQ-9 Score 0 0 0 0 0  Difficult doing work/chores  Not difficult at all  Not difficult at all     phq 9 is {gen pos [id number]}   Fall Risk:    01/27/2022   10:24 AM 12/17/2021   12:58 PM 12/17/2021   10:54 AM 12/03/2021    3:11 PM 10/28/2021    1:03 PM  Fall Risk   Falls in the past year? 0 0 0 0 0  Number falls in past yr:  0  0 0  Injury with Fall?  0  0 0  Risk for fall due to : Impaired balance/gait;Impaired mobility    Impaired mobility  Follow up Falls prevention discussed;Falls evaluation completed;Education provided  Falls evaluation completed  Falls prevention discussed      Functional Status Survey:      Assessment & Plan  *** There are no diagnoses linked to this encounter.

## 2022-03-06 NOTE — Telephone Encounter (Signed)
Pt states that dr Ancil Boozer had prescribed this medication for her last November. She had only given her 5 tables taking 2 by mouth daily. Pt wanted to know if dr Ancil Boozer would give her another prescription for only 5 more tablets. She would like to have them on hand just in case

## 2022-03-07 ENCOUNTER — Ambulatory Visit: Payer: Medicare Other | Admitting: Family Medicine

## 2022-03-07 ENCOUNTER — Other Ambulatory Visit: Payer: Self-pay | Admitting: Family Medicine

## 2022-03-07 NOTE — Telephone Encounter (Signed)
Called pharmacy and left vm asking for the information.

## 2022-03-10 ENCOUNTER — Telehealth: Payer: Self-pay | Admitting: Family Medicine

## 2022-03-10 ENCOUNTER — Other Ambulatory Visit: Payer: Self-pay | Admitting: Family Medicine

## 2022-03-10 DIAGNOSIS — F064 Anxiety disorder due to known physiological condition: Secondary | ICD-10-CM

## 2022-03-10 DIAGNOSIS — R600 Localized edema: Secondary | ICD-10-CM

## 2022-03-17 ENCOUNTER — Telehealth: Payer: Self-pay

## 2022-03-17 NOTE — Telephone Encounter (Signed)
Pt. Calling to verify instructions on her levothyroxine. Verbalizes understanding.

## 2022-03-24 ENCOUNTER — Ambulatory Visit (INDEPENDENT_AMBULATORY_CARE_PROVIDER_SITE_OTHER): Payer: Medicare Other | Admitting: Nurse Practitioner

## 2022-03-24 ENCOUNTER — Encounter (INDEPENDENT_AMBULATORY_CARE_PROVIDER_SITE_OTHER): Payer: Self-pay | Admitting: Nurse Practitioner

## 2022-03-24 VITALS — BP 132/69 | HR 60 | Resp 18

## 2022-03-24 DIAGNOSIS — I890 Lymphedema, not elsewhere classified: Secondary | ICD-10-CM | POA: Diagnosis not present

## 2022-03-24 DIAGNOSIS — E1122 Type 2 diabetes mellitus with diabetic chronic kidney disease: Secondary | ICD-10-CM

## 2022-03-24 DIAGNOSIS — N183 Chronic kidney disease, stage 3 unspecified: Secondary | ICD-10-CM

## 2022-03-24 DIAGNOSIS — I10 Essential (primary) hypertension: Secondary | ICD-10-CM

## 2022-04-06 ENCOUNTER — Encounter (INDEPENDENT_AMBULATORY_CARE_PROVIDER_SITE_OTHER): Payer: Self-pay | Admitting: Nurse Practitioner

## 2022-04-06 NOTE — Progress Notes (Signed)
Subjective:    Patient ID: Becky Trevino, female    DOB: February 21, 1946, 76 y.o.   MRN: [id number] No chief complaint on file.    Karmina Zufall is a 76 year old woman that returns today for follow-up evaluation of lower extremity edema.  The patient had been doing physical therapy but had not been walking for many months.  The patient notes that her leg swelling and lymphedema worsen.  She is very diligent about wearing her compression wraps using a Velcro compression system.  This does well for the swelling below her knees.  However she has developed swelling in the medial thighs bilaterally.  The left tends to be worse than the right.  Patient denies any open wounds or ulcerations.  She denies any fevers or chills.    Review of Systems  Cardiovascular:  Positive for leg swelling.  Musculoskeletal:  Positive for gait problem.  All other systems reviewed and are negative.      Objective:   Physical Exam Vitals reviewed.  HENT:     Head: Normocephalic.  Cardiovascular:     Rate and Rhythm: Normal rate.  Pulmonary:     Effort: Pulmonary effort is normal.  Musculoskeletal:     Right lower leg: 2+ Edema present.     Left lower leg: 2+ Edema present.  Skin:    General: Skin is warm and dry.     Comments: Dermal thickening bilaterally  Neurological:     Mental Status: She is alert and oriented to person, place, and time.  Psychiatric:        Mood and Affect: Mood normal.        Behavior: Behavior normal.        Thought Content: Thought content normal.        Judgment: Judgment normal.     BP 132/69 (BP Location: Left Arm)   Pulse 60   Resp 18   Past Medical History:  Diagnosis Date   Cervical spondylosis    Chronic kidney disease    Stage 3   DDD (degenerative disc disease), cervical    [facility]   Diabetes mellitus without complication (HCC)    Diverticulosis    Hyperlipidemia    Hypertension    Intertrigo    Leg cramps    Leg varices    Obesity    OSA  (obstructive sleep apnea)    Symptomatic menopausal or female climacteric states    Syncope     Social History   Socioeconomic History   Marital status: Married    Spouse name: Not on file   Number of children: 1   Years of education: Not on file   Highest education level: Not on file  Occupational History   Not on file  Tobacco Use   Smoking status: Never   Smokeless tobacco: Never  Vaping Use   Vaping Use: Never used  Substance and Sexual Activity   Alcohol use: No    Alcohol/week: 0.0 standard drinks of alcohol   Drug use: No   Sexual activity: Yes    Partners: Male  Other Topics Concern   Not on file  Social History Narrative   Married, one adopted grown child    Social Determinants of Health   Financial Resource Strain: Low Risk  (08/01/2021)   Overall Financial Resource Strain (CARDIA)    Difficulty of Paying Living Expenses: Not hard at all  Food Insecurity: No Food Insecurity (08/01/2021)   Hunger Vital Sign    Worried  About Running Out of Food in the Last Year: Never true    Ran Out of Food in the Last Year: Never true  Transportation Needs: No Transportation Needs (08/01/2021)   PRAPARE - Hydrologist (Medical): No    Lack of Transportation (Non-Medical): No  Physical Activity: Insufficiently Active (08/01/2021)   Exercise Vital Sign    Days of Exercise per Week: 2 days    Minutes of Exercise per Session: 30 min  Stress: No Stress Concern Present (08/01/2021)   [place]    Feeling of Stress : Only a little  Social Connections: Socially Integrated (08/01/2021)   Social Connection and Isolation Panel [NHANES]    Frequency of Communication with Friends and Family: More than three times a week    Frequency of Social Gatherings with Friends and Family: More than three times a week    Attends Religious Services: More than 4 times per year    Active Member of  [employer] or Organizations: Yes    Attends Archivist Meetings: More than 4 times per year    Marital Status: Married  Human resources officer Violence: Not At Risk (08/01/2021)   Humiliation, Afraid, Rape, and Kick questionnaire    Fear of Current or Ex-Partner: No    Emotionally Abused: No    Physically Abused: No    Sexually Abused: No    Past Surgical History:  Procedure Laterality Date   ABDOMINAL HYSTERECTOMY  1993   Total   BUNIONECTOMY Bilateral [place] Right 11/19/2017   Procedure: RIGHT KNEE POLY EXCHANGE WITH IRRIGATION AND DEBRIDEMENT;  Surgeon: Hessie Knows, MD;  Location: [facility];  Service: Orthopedics;  Laterality: Right;   IR FLUORO GUIDED NEEDLE PLC ASPIRATION/INJECTION LOC  09/19/2021   JOINT REPLACEMENT     bilateral knee   LAMINECTOMY  11/14/2013    Cervical Fusion , [facility], Dr. Delilah Shan   LASER ABLATION Bilateral 07/29/2012   Dr. Lucky Cowboy   LOWER EXTREMITY ANGIOGRAPHY Right 11/02/2019   Procedure: Lower Extremity Angiography;  Surgeon: Katha Cabal, MD;  Location: [place] [facility];  Service: Cardiovascular;  Laterality: Right;   ORIF ANKLE FRACTURE Right 09/02/2019   Procedure: OPEN REDUCTION INTERNAL FIXATION (ORIF) ANKLE FRACTURE BIMALLEOLAR;  Surgeon: [name], DPM;  Location: [facility];  Service: Podiatry;  Laterality: Right;   PERIPHERAL VASCULAR THROMBECTOMY Right 03/02/2020   Procedure: PERIPHERAL VASCULAR THROMBECTOMY;  Surgeon: Algernon Huxley, MD;  Location: [place] [facility];  Service: Cardiovascular;  Laterality: Right;   REPLACEMENT TOTAL KNEE Left 03/2010   Dr. Rudene Christians   TEE WITHOUT CARDIOVERSION N/A 09/19/2021   Procedure: TRANSESOPHAGEAL ECHOCARDIOGRAM (TEE);  Surgeon: Corey Skains, MD;  Location: [facility];  Service: Cardiovascular;  Laterality: N/A;   TOTAL HIP ARTHROPLASTY Left 12/11/2015   Procedure: TOTAL HIP ARTHROPLASTY ANTERIOR APPROACH;  Surgeon: Hessie Knows, MD;  Location: [facility];  Service: Orthopedics;   Laterality: Left;   TOTAL KNEE ARTHROPLASTY Right 03/11/2016   Procedure: TOTAL KNEE ARTHROPLASTY;  Surgeon: Hessie Knows, MD;  Location: [facility];  Service: Orthopedics;  Laterality: Right;    Family History  Problem Relation Age of Onset   Diabetes Mother    Hyperlipidemia Mother    Hypertension Mother    Diabetes Father    Hyperlipidemia Father    Hypertension Father    Obesity Father    Hypertension Sister    Hyperlipidemia Sister  Hyperlipidemia Sister    Breast cancer Neg Hx     Allergies  Allergen Reactions   Lisinopril Swelling       Latest Ref Rng & Units 12/03/2021    3:41 PM 09/23/2021    8:02 AM 09/20/2021    6:39 AM  CBC  WBC 3.8 - 10.8 Thousand/uL 5.3  10.5  10.8   Hemoglobin 11.7 - 15.5 g/dL 10.2  8.2  8.5   Hematocrit 35.0 - 45.0 % 32.1  25.5  25.5   Platelets 140 - 400 Thousand/uL 232  313  228       CMP     Component Value Date/Time   NA 139 01/27/2022 1216   NA 142 11/26/2015 1122   NA 141 10/18/2012 1716   K 4.6 01/27/2022 1216   K 3.5 10/18/2012 1716   CL 103 01/27/2022 1216   CL 110 (H) 10/18/2012 1716   CO2 28 01/27/2022 1216   CO2 24 10/18/2012 1716   GLUCOSE 77 01/27/2022 1216   GLUCOSE 128 (H) 10/18/2012 1716   BUN 24 01/27/2022 1216   BUN 27 11/26/2015 1122   BUN 31 (H) 10/18/2012 1716   CREATININE 1.47 (H) 01/27/2022 1216   CALCIUM 8.9 01/27/2022 1216   CALCIUM 7.9 (L) 10/18/2012 1716   PROT 8.4 (H) 01/27/2022 1216   PROT 7.6 11/26/2015 1122   PROT 8.0 05/22/2012 2206   ALBUMIN 3.2 (L) 09/15/2021 0533   ALBUMIN 4.3 11/26/2015 1122   ALBUMIN 3.4 05/22/2012 2206   AST 32 01/27/2022 1216   AST 34 05/22/2012 2206   ALT 34 (H) 01/27/2022 1216   ALT 31 05/22/2012 2206   ALKPHOS 60 09/15/2021 0533   ALKPHOS 115 05/22/2012 2206   BILITOT 0.6 01/27/2022 1216   BILITOT 0.3 11/26/2015 1122   BILITOT 0.3 05/22/2012 2206   GFRNONAA >60 09/23/2021 0802   GFRNONAA 41 (L) 02/04/2021 1123   GFRAA 48 (L) 02/04/2021 1123     No  results found.     Assessment & Plan:   1. Lymphedema Recommend:   No surgery or intervention at this point in time.     Chardae Mulkern has had a longstanding history of Persistent, chronic and severe lower extremity lymphedema extending into the abdomen and groin area.  The severity is identified by continue to medial thigh edema with skin discoloration and had skin breakdown.For the past 4 weeks, the patient has tried various treatment options with no significant improvement including: Compliant daily wear of 30 mmHg distally compression wraps, regular daily elevation and exercise, along with multiple daily in-home sessions with the [id number] lymphedema pump.  In addition, all appropriate medications and dietary evaluations and changes have been implemented.  Recommendations of the ED [phone] calibrated programmable pump at appropriate garments to better treat the abdomen and groin area.  Her recent abdominal measurements are 158 cm.   Patient should follow-up in six months    2. Benign essential HTN Continue antihypertensive medications as already ordered, these medications have been reviewed and there are no changes at this time.   3. Type 2 diabetes mellitus with stage 3 chronic kidney disease, without long-term current use of insulin, unspecified whether stage 3a or 3b CKD ([place]) Continue hypoglycemic medications as already ordered, these medications have been reviewed and there are no changes at this time.  Hgb A1C to be monitored as already arranged by primary service    Current Outpatient Medications on File Prior to Visit  Medication Sig Dispense Refill  ACCU-CHEK GUIDE test strip TEST ONCE A DAY AS DIRECTED 50 strip 1   acetaminophen (TYLENOL) 650 MG CR tablet Take 650-1,300 mg by mouth 2 (two) times daily as needed for pain.     amiodarone (PACERONE) 200 MG tablet Take 200 mg by mouth 2 (two) times daily.     apixaban (ELIQUIS) 5 MG TABS tablet Take 1 tablet by mouth 2 (two) times  daily.     atorvastatin (LIPITOR) 40 MG tablet Take 1 tablet (40 mg total) by mouth every evening. 90 tablet 1   blood glucose meter kit and supplies 1 each by Other route daily at 12 noon. Dispense based on patient and insurance preference. Check fsbs once a day 1 each 0   Calcium Carbonate-Vitamin D 600-400 MG-UNIT tablet Take 1 tablet by mouth 2 (two) times daily. 60 tablet 0   escitalopram (LEXAPRO) 5 MG tablet Take 1 tablet (5 mg total) by mouth daily. 90 tablet 1   fluticasone (FLONASE) 50 MCG/ACT nasal spray SPRAY 2 SPRAYS INTO EACH NOSTRIL EVERY DAY 48 mL 0   latanoprost (XALATAN) 0.005 % ophthalmic solution SMARTSIG:In Eye(s)     levothyroxine (SYNTHROID) 25 MCG tablet Take 1-2 tablets (25-50 mcg total) by mouth daily. Take one daily and twice a week take 2 pills and recheck in 6 weeks 100 tablet 0   Magnesium Oxide 500 MG CAPS Take 500 mg by mouth daily.      metoprolol succinate (TOPROL-XL) 50 MG 24 hr tablet Take 50 mg by mouth daily.     mirabegron ER (MYRBETRIQ) 50 MG TB24 tablet Take 1 tablet (50 mg total) by mouth daily. 90 tablet 3   Oxycodone HCl 10 MG TABS Take 1 tablet by mouth 4 (four) times daily.     polyethylene glycol (MIRALAX / GLYCOLAX) 17 g packet Take 17 g by mouth daily. 14 each 0   potassium chloride (KLOR-CON) 10 MEQ tablet Take 10 mEq by mouth daily.     SIMBRINZA 1-0.2 % SUSP Place 1 drop into both eyes 2 (two) times daily.     sodium chloride (OCEAN) 0.65 % SOLN nasal spray Place 1 spray into both nostrils 3 (three) times daily. 30 mL 0   torsemide (DEMADEX) 20 MG tablet TAKE 1 TABLET BY MOUTH EVERY DAY 90 tablet 1   traZODone (DESYREL) 50 MG tablet Take 0.5-1 tablets (25-50 mg total) by mouth at bedtime as needed. for sleep 90 tablet 0   amoxicillin (AMOXIL) 500 MG capsule SMARTSIG:1 Capsule(s) By Mouth Morning-Night     Semaglutide,0.25 or 0.5MG/DOS, (OZEMPIC, 0.25 OR 0.5 MG/DOSE,) 2 MG/3ML SOPN Inject 0.5 mg into the skin once a week. (Patient not taking:  Reported on 03/24/2022) 3 mL 0   No current facility-administered medications on file prior to visit.    There are no Patient Instructions on file for this visit. No follow-ups on file.   Kris Hartmann, NP

## 2022-04-23 ENCOUNTER — Other Ambulatory Visit: Payer: Self-pay | Admitting: Family Medicine

## 2022-04-29 NOTE — Progress Notes (Unsigned)
Name: Becky Trevino   MRN: [id number]    DOB: [birth year]   Date:04/30/2022       Progress Note  Subjective  Chief Complaint  Follow Up  HPI  DMII with renal manifestation and obesity:  she denies polyuria, polyphagia or polydipsia. A1Cis normal   She has CKI - last GFR 43 , dyslipidemia and obesity . She cannot take ACE due to angioedema. She states statins for dyslipidemia. She has neuropathy and sees Podiatrist on a regular basis. Ozempic was too costly e   Obesity Morbid: we got her weight today and it was 342 lbs, she is unable to be physically active and needs to lose weight, she states her insurance will cover [phone] and we will send rx today, discussed possible side effects.    Chronic DVT  right popliteal vein: diagnosed 10/27/2019,  she has an IV filter. Dr. Tasia Catchings had stopped Eliquis but had psoas hematoma but since resolution of hematoma Dr. Clayborn Bigness placed her back on it . She also has pulmonary hypertension and is wearing CPAP    Hyperlipidemia: taking Atorvastatin daily and denies side effects. No chest pain , no muscles pain. Last LDL was at goal    Insomnia/OSA :Last Echo done by Dr. Clayborn Bigness showed mild pulmonary hypertension . Reminded her to resume CPAP , her machine is old, she needs a new machine    OA: multiple joints, sees Ortho, Dr. Rudene Christians . She had revision of right knee replacement March 2019 because of infection. Still under the care of Ortho now also seeing Dr. Ola Spurr - ID. She is still on daily antibiotics - Amoxicillin , denies side effects    Left femoral nerve palsy:  seen by Dr. Rudene Christians, Dr. Manuella Ghazi, now seeing neurosurgeon at [facility] , Dr. Tamala Julian. She is able to walk short distances but s wearing brace because left knee is unstable due to femoral nerve palsy. She still  needs assistance bathing, cannot drive, cannot transfer alone . She was diagnosed with a hematoma of left iliopsoas during hospital stay 10/2019 , but she had repeat  CT pelvis 01/09/2020 that showed  resolution of hematoma. She is back on Eliquis and off aspirin now  Dr. Tamala Julian reviewed her case and advised against doing the surgery to decompress the nerve. She continues to have lower extremity edema that improves with massage She went to vascular surgeon and was advised to get the compression boots but she is now waiting for prescription    CKI stage III: GFR around 50's normal urine micro , not on ace at this time. BP is towards low end of normal, we will recheck levels and also send rx of farxiga to pharmacy   Atherosclerosis of Aorta: she is on statin and back on Eliquis Denies side effects Last LDL at goal   Hypothyroidism: on levothyroxine and we will recheck TSH today, last level was over 10. Denies hair loss, she has dry skin - but stable  Anxiety: she was placed on lexapro due to episodes of panic attack, she is doing well, no recent panic attacks.   Patient Active Problem List   Diagnosis Date Noted   Hypothyroidism (acquired) 01/27/2022   PAD (peripheral artery disease) ([place]) 09/27/2021   AF (paroxysmal atrial fibrillation) ([place]) 09/15/2021   Primary osteoarthritis of right hip 07/18/2021   Atherosclerosis of aorta ([place]) 06/07/2021   Mild protein-calorie malnutrition ([place])    Hyperbilirubinemia    Injury of left femoral nerve 08/28/2020   Lymphedema 06/25/2020   History  of pelvic hematoma 02/10/2020   Chronic venous hypertension due to deep vein thrombosis (DVT) 10/30/2019   Normocytic anemia 08/07/2019   Chronic infection of right knee ([place]) 11/25/2018   Infection and inflammatory reaction due to internal joint prosthesis, subsequent encounter 01/25/2018   Infection of total right knee replacement ([place]) 11/19/2017   Type 2 diabetes mellitus with stage 3b chronic kidney disease, without long-term current use of insulin ([place]) 11/06/2016   Primary osteoarthritis of knee 03/11/2016   Primary osteoarthritis of left hip 12/11/2015   Essential hypertension 04/21/2015   Edema  leg 04/21/2015   Chronic kidney disease (CKD), stage III (moderate) ([place]) 04/21/2015   Chronic venous insufficiency 04/21/2015   DDD (degenerative disc disease), cervical 04/21/2015   Diabetes mellitus with renal manifestation ([place]) 04/21/2015   Hyperlipidemia 04/21/2015   Bladder cystocele 04/21/2015   Urinary incontinence 04/21/2015   Leg varices 04/21/2015   Insomnia 04/21/2015   Eczema intertrigo 04/21/2015   Obesity, Class III, BMI 40-49.9 (morbid obesity) ([place]) 04/21/2015   OSA (obstructive sleep apnea) 04/21/2015   Osteoarthritis of multiple joints 04/21/2015   H/O Spinal surgery 11/14/2013   Det[place]or muscle hypertonia 11/07/2013    Past Surgical History:  Procedure Laterality Date   ABDOMINAL HYSTERECTOMY  1993   Total   BUNIONECTOMY Bilateral [place] Right 11/19/2017   Procedure: RIGHT KNEE POLY EXCHANGE WITH IRRIGATION AND DEBRIDEMENT;  Surgeon: Hessie Knows, MD;  Location: [facility];  Service: Orthopedics;  Laterality: Right;   IR FLUORO GUIDED NEEDLE PLC ASPIRATION/INJECTION LOC  09/19/2021   JOINT REPLACEMENT     bilateral knee   LAMINECTOMY  11/14/2013    Cervical Fusion , [facility], Dr. Delilah Shan   LASER ABLATION Bilateral 07/29/2012   Dr. Lucky Cowboy   LOWER EXTREMITY ANGIOGRAPHY Right 11/02/2019   Procedure: Lower Extremity Angiography;  Surgeon: Katha Cabal, MD;  Location: [place] [facility];  Service: Cardiovascular;  Laterality: Right;   ORIF ANKLE FRACTURE Right 09/02/2019   Procedure: OPEN REDUCTION INTERNAL FIXATION (ORIF) ANKLE FRACTURE BIMALLEOLAR;  Surgeon: [name], DPM;  Location: [facility];  Service: Podiatry;  Laterality: Right;   PERIPHERAL VASCULAR THROMBECTOMY Right 03/02/2020   Procedure: PERIPHERAL VASCULAR THROMBECTOMY;  Surgeon: Algernon Huxley, MD;  Location: [place] [facility];  Service: Cardiovascular;  Laterality: Right;   REPLACEMENT TOTAL KNEE Left 03/2010   Dr. Rudene Christians   TEE WITHOUT CARDIOVERSION N/A 09/19/2021    Procedure: TRANSESOPHAGEAL ECHOCARDIOGRAM (TEE);  Surgeon: Corey Skains, MD;  Location: [facility];  Service: Cardiovascular;  Laterality: N/A;   TOTAL HIP ARTHROPLASTY Left 12/11/2015   Procedure: TOTAL HIP ARTHROPLASTY ANTERIOR APPROACH;  Surgeon: Hessie Knows, MD;  Location: [facility];  Service: Orthopedics;  Laterality: Left;   TOTAL KNEE ARTHROPLASTY Right 03/11/2016   Procedure: TOTAL KNEE ARTHROPLASTY;  Surgeon: Hessie Knows, MD;  Location: [facility];  Service: Orthopedics;  Laterality: Right;    Family History  Problem Relation Age of Onset   Diabetes Mother    Hyperlipidemia Mother    Hypertension Mother    Diabetes Father    Hyperlipidemia Father    Hypertension Father    Obesity Father    Hypertension Sister    Hyperlipidemia Sister    Hyperlipidemia Sister    Breast cancer Neg Hx     Social History   Tobacco Use   Smoking status: Never   Smokeless tobacco: Never  Substance Use Topics   Alcohol use: No    Alcohol/week: 0.0  standard drinks of alcohol     Current Outpatient Medications:    ACCU-CHEK GUIDE test strip, TEST ONCE A DAY AS DIRECTED, Disp: 50 strip, Rfl: 1   acetaminophen (TYLENOL) 650 MG CR tablet, Take 650-1,300 mg by mouth 2 (two) times daily as needed for pain., Disp: , Rfl:    amiodarone (PACERONE) 200 MG tablet, Take 200 mg by mouth 2 (two) times daily., Disp: , Rfl:    amoxicillin (AMOXIL) 500 MG capsule, SMARTSIG:1 Capsule(s) By Mouth Morning-Night, Disp: , Rfl:    apixaban (ELIQUIS) 5 MG TABS tablet, Take 1 tablet by mouth 2 (two) times daily., Disp: , Rfl:    atorvastatin (LIPITOR) 40 MG tablet, Take 1 tablet (40 mg total) by mouth every evening., Disp: 90 tablet, Rfl: 1   blood glucose meter kit and supplies, 1 each by Other route daily at 12 noon. Dispense based on patient and insurance preference. Check fsbs once a day, Disp: 1 each, Rfl: 0   Calcium Carbonate-Vitamin D 600-400 MG-UNIT tablet, Take 1 tablet by mouth 2 (two) times daily., Disp:  60 tablet, Rfl: 0   escitalopram (LEXAPRO) 5 MG tablet, Take 1 tablet (5 mg total) by mouth daily., Disp: 90 tablet, Rfl: 1   fluticasone (FLONASE) 50 MCG/ACT nasal spray, SPRAY 2 SPRAYS INTO EACH NOSTRIL EVERY DAY, Disp: 48 mL, Rfl: 0   latanoprost (XALATAN) 0.005 % ophthalmic solution, SMARTSIG:In Eye(s), Disp: , Rfl:    levothyroxine (SYNTHROID) 25 MCG tablet, Take 1-2 tablets (25-50 mcg total) by mouth daily. Take one daily and twice a week take 2 pills and recheck in 6 weeks, Disp: 100 tablet, Rfl: 0   Magnesium Oxide 500 MG CAPS, Take 500 mg by mouth daily. , Disp: , Rfl:    metoprolol succinate (TOPROL-XL) 50 MG 24 hr tablet, Take 50 mg by mouth daily., Disp: , Rfl:    mirabegron ER (MYRBETRIQ) 50 MG TB24 tablet, Take 1 tablet (50 mg total) by mouth daily., Disp: 90 tablet, Rfl: 3   Oxycodone HCl 10 MG TABS, Take 1 tablet by mouth 4 (four) times daily., Disp: , Rfl:    polyethylene glycol (MIRALAX / GLYCOLAX) 17 g packet, Take 17 g by mouth daily., Disp: 14 each, Rfl: 0   potassium chloride (KLOR-CON) 10 MEQ tablet, Take 10 mEq by mouth daily., Disp: , Rfl:    Semaglutide,0.25 or 0.5MG/DOS, (OZEMPIC, 0.25 OR 0.5 MG/DOSE,) 2 MG/3ML SOPN, Inject 0.5 mg into the skin once a week., Disp: 3 mL, Rfl: 0   SIMBRINZA 1-0.2 % SUSP, Place 1 drop into both eyes 2 (two) times daily., Disp: , Rfl:    sodium chloride (OCEAN) 0.65 % SOLN nasal spray, Place 1 spray into both nostrils 3 (three) times daily., Disp: 30 mL, Rfl: 0   torsemide (DEMADEX) 20 MG tablet, TAKE 1 TABLET BY MOUTH EVERY DAY, Disp: 90 tablet, Rfl: 1   traZODone (DESYREL) 50 MG tablet, Take 0.5-1 tablets (25-50 mg total) by mouth at bedtime as needed. for sleep, Disp: 90 tablet, Rfl: 0  Allergies  Allergen Reactions   Lisinopril Swelling    I personally reviewed active problem list, medication list, allergies, family history, social history, health maintenance with the patient/caregiver today.   ROS  Constitutional: Negative for  fever , positive for  weight change.  Respiratory: Negative for cough and shortness of breath.   Cardiovascular: Negative for chest pain or palpitations.  Gastrointestinal: Negative for abdominal pain, no bowel changes.  Musculoskeletal: positive  for gait problem and  joint swelling.  Skin: Negative for rash.  Neurological: Negative for dizziness or headache.  No other specific complaints in a complete review of systems (except as listed in HPI above).   Objective  Vitals:   04/30/22 1026  BP: 128/76  Pulse: 60  Resp: 16  SpO2: 100%  Weight: (!) 342 lb (155.1 kg)  Height: 5' 11"  (1.803 m)    Body mass index is 47.7 kg/m.  Physical Exam  Constitutional: Patient appears well-developed and well-nourished. Obese  No distress.  HEENT: head atraumatic, normocephalic, pupils equal and reactive to light, neck supple Cardiovascular: Normal rate, regular rhythm and normal heart sounds.  No murmur heard. BLE edema. Right thigh more swollen than left  Pulmonary/Chest: Effort normal and breath sounds normal. No respiratory distress. Abdominal: Soft.  There is no tenderness. Psychiatric: Patient has a normal mood and affect. behavior is normal. Judgment and thought content normal.   Recent Results (from the past 2160 hour(s))  Microalbumin / creatinine urine ratio     Status: None   Collection Time: 02/05/22  8:00 AM  Result Value Ref Range   Creatinine, Urine 107 20 - 275 mg/dL   Microalb, Ur 0.2 mg/dL    Comment: Reference Range Not established    Microalb Creat Ratio 2 <30 mcg/mg creat    Comment: . The ADA defines abnormalities in albumin excretion as follows: Marland Kitchen Albuminuria Category        Result (mcg/mg creatinine) . Normal to Mildly increased   <30 Moderately increased         30-299  Severely increased           > OR = 300 . The ADA recommends that at least two of three specimens collected within a 3-6 month period be abnormal before considering a patient to  be within a diagnostic category.   POCT HgB A1C     Status: None   Collection Time: 04/30/22 10:30 AM  Result Value Ref Range   Hemoglobin A1C 5.2 4.0 - 5.6 %   HbA1c POC (<> result, manual entry)     HbA1c, POC (prediabetic range)     HbA1c, POC (controlled diabetic range)       PHQ2/9:    04/30/2022   10:27 AM 01/27/2022   10:24 AM 12/17/2021   12:59 PM 12/17/2021   10:53 AM 12/03/2021    3:11 PM  Depression screen PHQ 2/9  Decreased Interest 0 0 0 0 0  Down, Depressed, Hopeless 0 0 0 0 0  PHQ - 2 Score 0 0 0 0 0  Altered sleeping  0 0 0 0  Tired, decreased energy  0 0 0 0  Change in appetite  0 0 0 0  Feeling bad or failure about yourself   0 0 0 0  Trouble concentrating  0 0 0 0  Moving slowly or fidgety/restless  0 0 0 0  Suicidal thoughts  0 0 0 0  PHQ-9 Score  0 0 0 0  Difficult doing work/chores   Not difficult at all  Not difficult at all    phq 9 is negative   Fall Risk:    04/30/2022   10:27 AM 01/27/2022   10:24 AM 12/17/2021   12:58 PM 12/17/2021   10:54 AM 12/03/2021    3:11 PM  Fall Risk   Falls in the past year? 0 0 0 0 0  Number falls in past yr: 0  0  0  Injury with Fall? 0  0  0  Risk for fall due to : Impaired mobility;Impaired balance/gait Impaired balance/gait;Impaired mobility     Follow up Falls prevention discussed Falls prevention discussed;Falls evaluation completed;Education provided  Falls evaluation completed       Functional Status Survey: Is the patient deaf or have difficulty hearing?: No Does the patient have difficulty seeing, even when wearing glasses/contacts?: No Does the patient have difficulty concentrating, remembering, or making decisions?: No Does the patient have difficulty walking or climbing stairs?: Yes Does the patient have difficulty dressing or bathing?: Yes Does the patient have difficulty doing errands alone such as visiting a doctor's office or shopping?: Yes    Assessment & Plan  1. Type 2 diabetes mellitus  with diabetic neuropathy, without long-term current use of insulin (HCC)  - POCT HgB A1C - dapagliflozin propanediol (FARXIGA) 10 MG TABS tablet; Take 1 tablet (10 mg total) by mouth daily before breakfast.  Dispense: 90 tablet; Refill: 1  2. Atherosclerosis of aorta ([place])  On statin therapy   3. Stage 3b chronic kidney disease (HCC)  - dapagliflozin propanediol (FARXIGA) 10 MG TABS tablet; Take 1 tablet (10 mg total) by mouth daily before breakfast.  Dispense: 90 tablet; Refill: 1  4. Hypothyroidism (acquired)  - TSH  5. B12 deficiency   6. Pulmonary hypertension ([place])  Under the care of cardiologist   7. Morbid obesity ([place])  - Semaglutide-Weight Management (WEGOVY) 0.25 MG/0.5ML SOAJ; Inject 0.25 mg into the skin once a week.  Dispense: 2 mL; Refill: 0

## 2022-04-30 ENCOUNTER — Encounter: Payer: Self-pay | Admitting: Family Medicine

## 2022-04-30 ENCOUNTER — Ambulatory Visit (INDEPENDENT_AMBULATORY_CARE_PROVIDER_SITE_OTHER): Payer: Medicare Other | Admitting: Family Medicine

## 2022-04-30 VITALS — BP 128/76 | HR 60 | Resp 16 | Ht 71.0 in | Wt 342.0 lb

## 2022-04-30 DIAGNOSIS — I700 Atherosclerosis of aorta: Secondary | ICD-10-CM

## 2022-04-30 DIAGNOSIS — E538 Deficiency of other specified B group vitamins: Secondary | ICD-10-CM

## 2022-04-30 DIAGNOSIS — N1832 Chronic kidney disease, stage 3b: Secondary | ICD-10-CM | POA: Diagnosis not present

## 2022-04-30 DIAGNOSIS — E1140 Type 2 diabetes mellitus with diabetic neuropathy, unspecified: Secondary | ICD-10-CM | POA: Diagnosis not present

## 2022-04-30 DIAGNOSIS — E039 Hypothyroidism, unspecified: Secondary | ICD-10-CM

## 2022-04-30 DIAGNOSIS — I272 Pulmonary hypertension, unspecified: Secondary | ICD-10-CM

## 2022-04-30 LAB — POCT GLYCOSYLATED HEMOGLOBIN (HGB A1C): Hemoglobin A1C: 5.2 % (ref 4.0–5.6)

## 2022-04-30 MED ORDER — DAPAGLIFLOZIN PROPANEDIOL 10 MG PO TABS: 10.0000 mg | ORAL_TABLET | Freq: Every day | ORAL | 1 refills | Status: DC

## 2022-04-30 MED ORDER — WEGOVY 0.25 MG/0.5ML ~~LOC~~ SOAJ: 0.2500 mg | SUBCUTANEOUS | 0 refills | Status: DC

## 2022-05-01 ENCOUNTER — Other Ambulatory Visit: Payer: Self-pay | Admitting: Family Medicine

## 2022-05-01 DIAGNOSIS — E039 Hypothyroidism, unspecified: Secondary | ICD-10-CM

## 2022-05-01 LAB — TSH: TSH: 12.96 mIU/L — ABNORMAL HIGH (ref 0.40–4.50)

## 2022-05-01 MED ORDER — LEVOTHYROXINE SODIUM 50 MCG PO TABS
50.0000 ug | ORAL_TABLET | Freq: Every day | ORAL | 0 refills | Status: DC
Start: 2022-05-01 — End: 2022-07-28

## 2022-05-12 ENCOUNTER — Telehealth: Payer: Self-pay | Admitting: Family Medicine

## 2022-05-12 ENCOUNTER — Other Ambulatory Visit: Payer: Self-pay | Admitting: Family Medicine

## 2022-05-12 ENCOUNTER — Telehealth (INDEPENDENT_AMBULATORY_CARE_PROVIDER_SITE_OTHER): Payer: Self-pay | Admitting: Nurse Practitioner

## 2022-05-12 DIAGNOSIS — E1140 Type 2 diabetes mellitus with diabetic neuropathy, unspecified: Secondary | ICD-10-CM

## 2022-05-12 DIAGNOSIS — I10 Essential (primary) hypertension: Secondary | ICD-10-CM

## 2022-05-12 DIAGNOSIS — N1832 Chronic kidney disease, stage 3b: Secondary | ICD-10-CM

## 2022-05-12 NOTE — Telephone Encounter (Signed)
Patient made aware that we have submitted for lymphedema pump to [employer] and someone from the company will reach out

## 2022-05-12 NOTE — Telephone Encounter (Signed)
Patient called in for financial assistance with meds, such as eliquis,farxiga ,eyedrops, bladder med, didn't have the name of that. Please call back to discuss

## 2022-05-12 NOTE — Telephone Encounter (Signed)
Pt called in wanting to know if Eulogio Ditch, NP, had sent in her paperwork regarding her unna boots. I asked more questions about what this paperwork was andut and pt stated that she didn't rally know, just that it was for some kind of wraps. Please advise paper.

## 2022-05-12 NOTE — Telephone Encounter (Signed)
Yes, please place a referral.   Thanks, Becky Trevino, [place] Pharmacist Practitioner  [facility] [phone]

## 2022-05-20 ENCOUNTER — Telehealth: Payer: Self-pay | Admitting: *Deleted

## 2022-05-20 LAB — HM DIABETES EYE EXAM

## 2022-05-20 NOTE — Chronic Care Management (AMB) (Signed)
  Chronic Care Management   Note  05/20/2022 Name: Becky Trevino MRN: [id number] DOB: 15-Aug-1946  Becky Trevino is a 76 y.o. year old female who is a primary care patient of [name], MD. I reached out to [name] by phone today in response to a referral sent by [name]'s PCP.  [name] was given information about Chronic Care Management services today including:  CCM service includes personalized support from designated clinical staff supervised by her physician, including individualized plan of care and coordination with other care providers 24/7 contact phone numbers for assistance for urgent and routine care needs. Service will only be billed when office clinical staff spend 20 minutes or more in a month to coordinate care. Only one practitioner may furnish and bill the service in a calendar month. The patient may stop CCM services at any time (effective at the end of the month) by phone call to the office staff. The patient is responsible for co-pay (up to 20% after annual deductible is met) if co-pay is required by the individual health plan.   Patient agreed to services and verbal consent obtained.   Follow up plan: Telephone appointment with care management team member scheduled for: 05/28/2022  Julian Hy, [place] Direct Dial: [phone]

## 2022-05-21 ENCOUNTER — Other Ambulatory Visit: Payer: Self-pay | Admitting: Family Medicine

## 2022-05-26 ENCOUNTER — Other Ambulatory Visit: Payer: Self-pay | Admitting: Family Medicine

## 2022-05-27 ENCOUNTER — Telehealth: Payer: Self-pay

## 2022-05-27 NOTE — Progress Notes (Signed)
Chronic Care Management Pharmacy Assistant   Name: Becky Trevino  MRN: [id number] DOB: 06-Oct-1945  Reason for Encounter: Initial Visit with Becky Trevino, [name] on 05/28/2022 @ 1100 via telephone   Conditions to be addressed/monitored: HTN, HLD, DMII, CKD Stage III, Osteoarthritis, and Chronic Venous Insufficiency, Leg Varices, Chronic Venous Hypertension due to deep vein thrombosis, Atherosclerosis of Aorta, AF, PAD, Injury of Left Femoral Nerve, DDD, Urinary Incontinence, Insomnia,    Primary concerns for visit include: I did call the patient but had to LVM requesting a call back   Recent office visits:  04/30/2022 [name], MD (PCP Office Visit) for Follow-up- Started: Dapagliflozin Propanediol 10 mg daily, Semaglutide-Weight Management 0.25 weekly,  Stopped: Semaglutide 0.5 mg due to cost of medication, Lab orders placed, Return in 4 months  01/27/2022 [name], MD (PCP Office Visit) for Follow-up- Stopped: Amoxicillin 500 mg (due to patient not taking), Aspirin 81 mg due to change in protocol, Lab orders placed, Referral to sleep studies, Diabetic Foot Exam, Patient to follow-up in 4 months  12/17/2021 [name], MD (PCP Office Visit) for Follow-up- No medication changes noted, No orders placed, No follow-up noted  12/03/2021 Rory Percy, DO (PCP Office Visit) for [place]- Increase torsemide to 2 pills daily for the next few days. , Lab orders placed, Chest XR order placed,   Recent consult visits:  04/03/2022 Jobe Gibbon, MD (Cardiology) for Follow-up- No medication changes noted, No orders placed, Follow-up in 4 months  03/24/2022 Eulogio Ditch, NP (Vascular Surgery) for Lymphedema- No medication changes noted, No orders placed, No follow-up noted  03/14/2022 Varney Baas, DPM (Podiatry) for Nail Problem No medication changes noted, No orders placed, patient to follow-up in 3 months  01/13/2022 Lauris Poag, MD (Orthopedic) for  Follow-up- No medication changes noted, No orders placed, Patient to follow-up in 4 months  12/17/2021 [name], MD (Infectious Disease) Follow-up- Changed: Amoxicillin 500 mg 3 times daily to twice daily. No orders placed, patient to follow-up in 6 months  12/13/2021 Varney Baas, DPM (Podiatry) for Nail Problem- No medication changes noted, No orders placed, patient to follow-up in 3 months  12/12/2021 Jobe Gibbon, MD (Cardiology) Unable to view this noted  Hospital visits:  None in previous 6 months  Medications: Outpatient Encounter Medications as of 05/27/2022  Medication Sig   ACCU-CHEK GUIDE test strip TEST ONCE A DAY AS DIRECTED   acetaminophen (TYLENOL) 650 MG CR tablet Take 650-1,300 mg by mouth 2 (two) times daily as needed for pain.   amiodarone (PACERONE) 200 MG tablet Take 200 mg by mouth 2 (two) times daily.   amoxicillin (AMOXIL) 500 MG capsule SMARTSIG:1 Capsule(s) By Mouth Morning-Night   apixaban (ELIQUIS) 5 MG TABS tablet Take 1 tablet by mouth 2 (two) times daily.   atorvastatin (LIPITOR) 40 MG tablet Take 1 tablet (40 mg total) by mouth every evening.   blood glucose meter kit and supplies 1 each by Other route daily at 12 noon. Dispense based on patient and insurance preference. Check fsbs once a day   Calcium Carbonate-Vitamin D 600-400 MG-UNIT tablet Take 1 tablet by mouth 2 (two) times daily.   dapagliflozin propanediol (FARXIGA) 10 MG TABS tablet Take 1 tablet (10 mg total) by mouth daily before breakfast.   escitalopram (LEXAPRO) 5 MG tablet Take 1 tablet (5 mg total) by mouth daily.   fluticasone (FLONASE) 50 MCG/ACT nasal spray SPRAY 2 SPRAYS INTO EACH NOSTRIL EVERY DAY   latanoprost (XALATAN) 0.005 %  ophthalmic solution SMARTSIG:In Eye(s)   levothyroxine (SYNTHROID) 50 MCG tablet Take 1 tablet (50 mcg total) by mouth daily.   Magnesium Oxide 500 MG CAPS Take 500 mg by mouth daily.    metoprolol succinate (TOPROL-XL) 50 MG 24 hr  tablet Take 50 mg by mouth daily.   mirabegron ER (MYRBETRIQ) 50 MG TB24 tablet Take 1 tablet (50 mg total) by mouth daily.   Oxycodone HCl 10 MG TABS Take 1 tablet by mouth 4 (four) times daily.   polyethylene glycol (MIRALAX / GLYCOLAX) 17 g packet Take 17 g by mouth daily.   potassium chloride (KLOR-CON) 10 MEQ tablet Take 10 mEq by mouth daily.   Semaglutide-Weight Management (WEGOVY) 0.25 MG/0.5ML SOAJ Inject 0.25 mg into the skin once a week.   SIMBRINZA 1-0.2 % SUSP Place 1 drop into both eyes 2 (two) times daily.   sodium chloride (OCEAN) 0.65 % SOLN nasal spray Place 1 spray into both nostrils 3 (three) times daily.   torsemide (DEMADEX) 20 MG tablet TAKE 1 TABLET BY MOUTH EVERY DAY   traZODone (DESYREL) 50 MG tablet Take 0.5-1 tablets (25-50 mg total) by mouth at bedtime as needed. for sleep   No facility-administered encounter medications on file as of 05/27/2022.   Care Gaps: Influenza Vaccine  Star Rating Drugs: Atorvastatin 40 mg last filled on 04/28/2022 for a 90-Day supply  Farxiga 10 mg last filled 04/30/2022 for a 30-Day supply  Questions for Clinical Pharmacist:   1.Are you able to connect with Patient No    2.Confirmed appointment date/time with patient/caregiver?  VM left requesting patient to return my call      3.Visit type telephone    I was given the chart prep today. I reached out to the patient, but my attempt wasn't successful. Patient's phone when straight to VM.      Lynann Bologna, CPA/CMA Clinical Pharmacist Assistant Phone: [phone]

## 2022-05-28 ENCOUNTER — Ambulatory Visit (INDEPENDENT_AMBULATORY_CARE_PROVIDER_SITE_OTHER): Payer: Medicare Other

## 2022-05-28 DIAGNOSIS — I739 Peripheral vascular disease, unspecified: Secondary | ICD-10-CM

## 2022-05-28 DIAGNOSIS — N1832 Chronic kidney disease, stage 3b: Secondary | ICD-10-CM

## 2022-05-28 DIAGNOSIS — E785 Hyperlipidemia, unspecified: Secondary | ICD-10-CM

## 2022-05-28 DIAGNOSIS — I700 Atherosclerosis of aorta: Secondary | ICD-10-CM

## 2022-05-28 DIAGNOSIS — M170 Bilateral primary osteoarthritis of knee: Secondary | ICD-10-CM

## 2022-05-28 DIAGNOSIS — F5101 Primary insomnia: Secondary | ICD-10-CM

## 2022-05-28 DIAGNOSIS — I480 Paroxysmal atrial fibrillation: Secondary | ICD-10-CM

## 2022-05-28 DIAGNOSIS — I10 Essential (primary) hypertension: Secondary | ICD-10-CM

## 2022-05-28 NOTE — Progress Notes (Signed)
Chronic Care Management Pharmacy Note  05/29/2022 Name:  Becky Trevino MRN:  [id number] DOB:  Jul 03, 1946  Summary: Patient presents for initial CCM consult. She has multiple medications she has difficulties affording.   Recommendations/Changes made from today's visit: Continue current medications Start PAP for Eliquis + Farxiga   Plan: [name] follow-up 4 months   Recommended Problem List Changes:  Add: Anxiety  Subjective: Becky Trevino is an 76 y.o. year old female who is a primary patient of [name], MD.  The CCM team was consulted for assistance with disease management and care coordination needs.    Engaged with patient by telephone for initial visit in response to provider referral for pharmacy case management and/or care coordination services.   Consent to Services:  The patient was given the following information about Chronic Care Management services today, agreed to services, and gave verbal consent: 1. CCM service includes personalized support from designated clinical staff supervised by the primary care provider, including individualized plan of care and coordination with other care providers 2. 24/7 contact phone numbers for assistance for urgent and routine care needs. 3. Service will only be billed when office clinical staff spend 20 minutes or more in a month to coordinate care. 4. Only one practitioner may furnish and bill the service in a calendar month. 5.The patient may stop CCM services at any time (effective at the end of the month) by phone call to the office staff. 6. The patient will be responsible for cost sharing (co-pay) of up to 20% of the service fee (after annual deductible is met). Patient agreed to services and consent obtained.  Patient Care Team: [name], MD as PCP - General (Family Medicine) Yolonda Kida, MD as Consulting Physician (Cardiology) Lorelee Cover., MD as Consulting Physician (Ophthalmology) Hessie Knows, MD as  Consulting Physician (Orthopedic Surgery) [name], DPM as Consulting Physician (Podiatry) [name], MD as Consulting Physician (Oncology) Lucky Cowboy, Erskine Squibb, MD as Referring Physician (Vascular Surgery) Germaine Pomfret, [facility] (Pharmacist)  Recent office visits: 04/30/2022 [name], MD (PCP Office Visit) for Follow-up- Started: Dapagliflozin Propanediol 10 mg daily, Semaglutide-Weight Management 0.25 weekly,  Stopped: Semaglutide 0.5 mg due to cost of medication, Lab orders placed, Return in 4 months  01/27/2022 [name], MD (PCP Office Visit) for Follow-up- Stopped: Amoxicillin 500 mg (due to patient not taking), Aspirin 81 mg due to change in protocol, Lab orders placed, Referral to sleep studies, Diabetic Foot Exam, Patient to follow-up in 4 months   12/17/2021 [name], MD (PCP Office Visit) for Follow-up- No medication changes noted, No orders placed, No follow-up noted   12/03/2021 Rory Percy, DO (PCP Office Visit) for [place]- Increase torsemide to 2 pills daily for the next few days. , Lab orders placed, Chest XR order placed, Recent consult visits: 04/03/2022 Jobe Gibbon, MD (Cardiology) for Follow-up- No medication changes noted, No orders placed, Follow-up in 4 months   03/24/2022 Eulogio Ditch, NP (Vascular Surgery) for Lymphedema- No medication changes noted, No orders placed, No follow-up noted   03/14/2022 Varney Baas, DPM (Podiatry) for Nail Problem No medication changes noted, No orders placed, patient to follow-up in 3 months   01/13/2022 Lauris Poag, MD (Orthopedic) for Follow-up- No medication changes noted, No orders placed, Patient to follow-up in 4 months   12/17/2021 [name], MD (Infectious Disease) Follow-up- Changed: Amoxicillin 500 mg 3 times daily to twice daily. No orders placed, patient to follow-up in 6 months  12/13/2021 Varney Baas, DPM (Podiatry) for Nail Problem- No medication changes  noted, No orders placed, patient to follow-up in 3 months   12/12/2021 Jobe Gibbon, MD (Cardiology) Unable to view this noted  Hospital visits: None in previous 6 months   Objective:  Lab Results  Component Value Date   CREATININE 1.47 (H) 01/27/2022   BUN 24 01/27/2022   EGFR 37 (L) 01/27/2022   GFRNONAA >60 09/23/2021   GFRAA 48 (L) 02/04/2021   NA 139 01/27/2022   K 4.6 01/27/2022   CALCIUM 8.9 01/27/2022   CO2 28 01/27/2022   GLUCOSE 77 01/27/2022    Lab Results  Component Value Date/Time   HGBA1C 5.2 04/30/2022 10:30 AM   HGBA1C 5.5 01/27/2022 11:47 AM   HGBA1C 5.4 09/16/2021 03:12 AM   HGBA1C 5.5 10/28/2019 06:23 AM   HGBA1C 6.0 05/12/2019 10:33 AM   HGBA1C 5.7 07/27/2018 08:43 AM   MICROALBUR 0.2 02/05/2022 08:00 AM   MICROALBUR 0.6 02/13/2021 08:56 AM   MICROALBUR 20 06/09/2017 09:47 AM   MICROALBUR 20 04/23/2016 09:36 AM    Last diabetic Eye exam:  Lab Results  Component Value Date/Time   HMDIABEYEEXA No Retinopathy 05/20/2022 12:00 AM    Last diabetic Foot exam:  Lab Results  Component Value Date/Time   HMDIABFOOTEX Completed 12/13/2021 12:00 AM     Lab Results  Component Value Date   CHOL 122 01/27/2022   HDL 48 (L) 01/27/2022   LDLCALC 61 01/27/2022   TRIG 47 01/27/2022   CHOLHDL 2.5 01/27/2022       Latest Ref Rng & Units 01/27/2022   12:16 PM 09/15/2021    5:33 AM 09/14/2021    4:57 PM  Hepatic Function  Total Protein 6.1 - 8.1 g/dL 8.4  7.5  8.2   Albumin 3.5 - 5.0 g/dL  3.2  3.7   AST 10 - 35 U/L 32  21  29   ALT 6 - 29 U/L 34  18  20   Alk Phosphatase 38 - 126 U/L  60  66   Total Bilirubin 0.2 - 1.2 mg/dL 0.6  1.1  1.1     Lab Results  Component Value Date/Time   TSH 12.96 (H) 04/30/2022 11:12 AM   TSH 10.79 (H) 01/27/2022 12:16 PM   FREET4 0.85 02/23/2021 10:49 AM   FREET4 0.75 08/07/2019 05:09 PM       Latest Ref Rng & Units 12/03/2021    3:41 PM 09/23/2021    8:02 AM 09/20/2021    6:39 AM  CBC  WBC 3.8 - 10.8  Thousand/uL 5.3  10.5  10.8   Hemoglobin 11.7 - 15.5 g/dL 10.2  8.2  8.5   Hematocrit 35.0 - 45.0 % 32.1  25.5  25.5   Platelets 140 - 400 Thousand/uL 232  313  228     Lab Results  Component Value Date/Time   VD25OH 43 09/03/2017 08:28 AM    Clinical ASCVD: No  The ASCVD Risk score (Arnett DK, et al., 2019) failed to calculate for the following reasons:   The valid total cholesterol range is 130 to 320 mg/dL       04/30/2022   10:27 AM 01/27/2022   10:24 AM 12/17/2021   12:59 PM  Depression screen PHQ 2/9  Decreased Interest 0 0 0  Down, Depressed, Hopeless 0 0 0  PHQ - 2 Score 0 0 0  Altered sleeping  0 0  Tired, decreased energy  0 0  Change in appetite  0 0  Feeling bad or failure about yourself   0 0  Trouble concentrating  0 0  Moving slowly or fidgety/restless  0 0  Suicidal thoughts  0 0  PHQ-9 Score  0 0  Difficult doing work/chores   Not difficult at all    Social History   Tobacco Use  Smoking Status Never  Smokeless Tobacco Never   BP Readings from Last 3 Encounters:  04/30/22 128/76  03/24/22 132/69  01/27/22 116/72   Pulse Readings from Last 3 Encounters:  04/30/22 60  03/24/22 60  01/27/22 62   Wt Readings from Last 3 Encounters:  04/30/22 (!) 342 lb (155.1 kg)  01/27/22 (!) 320 lb (145.2 kg)  12/03/21 (!) 318 lb (144.2 kg)   BMI Readings from Last 3 Encounters:  04/30/22 47.70 kg/m  01/27/22 44.63 kg/m  12/03/21 44.35 kg/m    Assessment/Interventions: Review of patient past medical history, allergies, medications, health status, including review of consultants reports, laboratory and other test data, was performed as part of comprehensive evaluation and provision of chronic care management services.   SDOH:  (Social Determinants of Health) assessments and interventions performed: Yes SDOH Interventions    Flowsheet Row Chronic Care Management from 05/28/2022 in [place] from 08/01/2021 in [facility]  SDOH Interventions    Food Insecurity Interventions -- Intervention Not Indicated  Housing Interventions -- Intervention Not Indicated  Transportation Interventions -- Intervention Not Indicated  Financial Strain Interventions Other (Comment)  [PAP] Intervention Not Indicated  Physical Activity Interventions -- Intervention Not Indicated  Stress Interventions -- Intervention Not Indicated  Social Connections Interventions -- Intervention Not Indicated      SDOH Screenings   Food Insecurity: No Food Insecurity (05/28/2022)  Housing: Low Risk  (08/01/2021)  Transportation Needs: No Transportation Needs (08/01/2021)  Alcohol Screen: Low Risk  (08/01/2021)  Depression (PHQ2-9): Low Risk  (04/30/2022)  Financial Resource Strain: High Risk (05/29/2022)  Physical Activity: Insufficiently Active (08/01/2021)  Social Connections: Socially Integrated (08/01/2021)  Stress: No Stress Concern Present (08/01/2021)  Tobacco Use: Low Risk  (04/30/2022)    [place]  Allergies  Allergen Reactions   Lisinopril Swelling    Medications Reviewed Today     Reviewed by Germaine Pomfret, [facility] (Pharmacist) on 05/28/22 at 1134  Med List Status: <None>   Medication Order Taking? Sig Documenting Provider Last Dose Status Informant  ACCU-CHEK GUIDE test strip [id number]  TEST ONCE A DAY AS DIRECTED [name], MD  Active   acetaminophen (TYLENOL) 650 MG CR tablet [id number] Yes Take 650-1,300 mg by mouth 2 (two) times daily as needed for pain. [provider] Taking Active Spouse/Significant Other, Multiple Informants  amiodarone (PACERONE) 200 MG tablet [id number] Yes Take 200 mg by mouth daily. Lujean Amel D, MD Taking Active Multiple Informants  amoxicillin (AMOXIL) 500 MG capsule [id number] Yes Take 500 mg by mouth 2 (two) times daily. [provider] Taking Active   apixaban (ELIQUIS) 5 MG TABS tablet [id number] Yes Take 1 tablet by mouth 2 (two)  times daily. Yolonda Kida, MD Taking Active Multiple Informants           Med Note Daune Perch   [facility] Sep 14, 2021  9:30 PM)    atorvastatin (LIPITOR) 40 MG tablet [id number] Yes Take 1 tablet (40 mg total) by mouth every evening. [name], MD Taking Active   blood glucose meter kit and supplies [id number]  1 each by Other route daily at 12 noon. Dispense based on patient and insurance preference. Check fsbs once a day [name], MD  Active   Calcium Carbonate-Vitamin D 600-400 MG-UNIT tablet [id number] Yes Take 1 tablet by mouth 2 (two) times daily. [name], MD Taking Active Spouse/Significant Other, Multiple Informants  dapagliflozin propanediol (FARXIGA) 10 MG TABS tablet [id number] Yes Take 1 tablet (10 mg total) by mouth daily before breakfast. [name], MD Taking Active   escitalopram (LEXAPRO) 5 MG tablet [id number] Yes Take 1 tablet (5 mg total) by mouth daily. [name], MD Taking Active   fluticasone [facility]) 50 MCG/ACT nasal spray [id number] Yes SPRAY 2 SPRAYS INTO EACH NOSTRIL EVERY DAY Sowles, Drue Stager, MD Taking Active Spouse/Significant Other, Multiple Informants  latanoprost (XALATAN) 0.005 % ophthalmic solution [id number]  SMARTSIG:In Eye(s) [provider]  Active Spouse/Significant Other, Multiple Informants  levothyroxine (SYNTHROID) 50 MCG tablet [id number] Yes Take 1 tablet (50 mcg total) by mouth daily. [name], MD Taking Active   Magnesium Oxide 500 MG CAPS [id number] Yes Take 500 mg by mouth daily.  [provider] Taking Active Spouse/Significant Other, Multiple Informants           Med Note Arvella Nigh, [place] A   Wed Feb 27, 2016  1:43 PM)    metoprolol succinate (TOPROL-XL) 50 MG 24 hr tablet [id number] Yes Take 50 mg by mouth daily. Callwood, Dwayne D, MD Taking Active Multiple Informants  mirabegron ER (MYRBETRIQ) 50 MG TB24 tablet [id number] Yes Take 1 tablet (50 mg total) by mouth daily. Nori Riis, PA-C  Taking Active Spouse/Significant Other, Multiple Informants  Oxycodone HCl 10 MG TABS [id number] Yes Take 1 tablet by mouth 4 (four) times daily as needed. Hessie Knows, MD Taking Active   polyethylene glycol (MIRALAX / GLYCOLAX) 17 g packet [id number] Yes Take 17 g by mouth daily.  Patient taking differently: Take 17 g by mouth every other day.   Antonieta Pert, MD Taking Active Spouse/Significant Other, Multiple Informants  potassium chloride (KLOR-CON) 10 MEQ tablet [id number] Yes Take 10 mEq by mouth daily. Callwood, Dwayne D, MD Taking Active Spouse/Significant Other, Multiple Informants  SIMBRINZA 1-0.2 % SUSP [id number] Yes Place 1 drop into both eyes 2 (two) times daily. [provider] Taking Active Spouse/Significant Other, Multiple Informants  sodium chloride (OCEAN) 0.65 % SOLN nasal spray [id number]  Place 1 spray into both nostrils 3 (three) times daily. Fritzi Mandes, MD  Active Multiple Informants  torsemide (DEMADEX) 20 MG tablet [id number] Yes TAKE 1 TABLET BY MOUTH EVERY DAY [name], MD Taking Active   traZODone (DESYREL) 50 MG tablet [id number] Yes Take 0.5-1 tablets (25-50 mg total) by mouth at bedtime as needed. for sleep [name], MD Taking Active   Med List Note Arby Barrette, [place] 07/16/21 3818): Marland Kitchen            Patient Active Problem List   Diagnosis Date Noted   Hypothyroidism (acquired) 01/27/2022   PAD (peripheral artery disease) ([place]) 09/27/2021   AF (paroxysmal atrial fibrillation) ([place]) 09/15/2021   Primary osteoarthritis of right hip 07/18/2021   Atherosclerosis of aorta ([place]) 06/07/2021   Mild protein-calorie malnutrition ([place])    Hyperbilirubinemia    Injury of left femoral nerve 08/28/2020   Lymphedema 06/25/2020   History of pelvic hematoma 02/10/2020   Chronic venous hypertension due to deep vein thrombosis (DVT) 10/30/2019   Normocytic anemia 08/07/2019   Chronic infection of right knee ([place]) 11/25/2018   Infection and  inflammatory  reaction due to internal joint prosthesis, subsequent encounter 01/25/2018   Infection of total right knee replacement ([place]) 11/19/2017   Type 2 diabetes mellitus with stage 3b chronic kidney disease, without long-term current use of insulin ([place]) 11/06/2016   Primary osteoarthritis of knee 03/11/2016   Primary osteoarthritis of left hip 12/11/2015   Essential hypertension 04/21/2015   Edema leg 04/21/2015   Chronic kidney disease (CKD), stage III (moderate) (HCC) 04/21/2015   Chronic venous insufficiency 04/21/2015   DDD (degenerative disc disease), cervical 04/21/2015   Diabetes mellitus with renal manifestation ([place]) 04/21/2015   Hyperlipidemia 04/21/2015   Bladder cystocele 04/21/2015   Urinary incontinence 04/21/2015   Leg varices 04/21/2015   Insomnia 04/21/2015   Eczema intertrigo 04/21/2015   Obesity, Class III, BMI 40-49.9 (morbid obesity) ([place]) 04/21/2015   OSA (obstructive sleep apnea) 04/21/2015   Osteoarthritis of multiple joints 04/21/2015   H/O Spinal surgery 11/14/2013   Detrusor muscle hypertonia 11/07/2013    Immunization History  Administered Date(s) Administered   Fluad Quad(high Dose 65+) 05/12/2019, 05/29/2020, 06/07/2021   Influenza, High Dose Seasonal PF 05/22/2016, 06/09/2017, 05/21/2018   Influenza,inj,Quad PF,6+ Mos 04/26/2015   Influenza-Unspecified 04/15/2014   Moderna Covid-19 Vaccine Bivalent Booster 60yr & up 07/29/2021   Moderna Sars-Covid-2 Vaccination 10/24/2019, 11/21/2019, 08/15/2020, 01/14/2021   Pneumococcal Conjugate-13 07/09/2006, 01/25/2015   Pneumococcal Polysaccharide-23 03/11/2012   Tdap 06/03/2006, 11/06/2016   Zoster Recombinat (Shingrix) 11/14/2020, 06/07/2021   Zoster, Live 06/19/2010    Conditions to be addressed/monitored:  Hypertension, Hyperlipidemia, Diabetes, Atrial Fibrillation, Coronary Artery Disease, Chronic Kidney Disease, Osteoarthritis, and Peripheral Artery Disease, Obesity   Care Plan : General  Pharmacy (Adult)  Updates made by FGermaine Pomfret RPH since 05/29/2022 12:00 AM     Problem: Hypertension, Hyperlipidemia, Diabetes, Atrial Fibrillation, Coronary Artery Disease, Chronic Kidney Disease, Osteoarthritis, and Peripheral Artery Disease, Obesity   Priority: High     Long-Range Goal: Patient-Specific Goal   Start Date: 05/29/2022  Expected End Date: 05/30/2023  This Visit's Progress: On track  Priority: High  Note:   Current Barriers:  Unable to independently afford treatment regimen  Pharmacist Clinical Goal(s):  Patient will verbalize ability to afford treatment regimen through collaboration with PharmD and provider.   Interventions: 1:1 collaboration with SSteele Sizer MD regarding development and update of comprehensive plan of care as evidenced by provider attestation and co-signature Inter-disciplinary care team collaboration (see longitudinal plan of care) Comprehensive medication review performed; medication list updated in electronic medical record  Hypertension (BP goal <130/80) -Controlled -Current treatment: Metoprolol XL 50 mg daily  Torsemide 20 mg daily  -Medications previously tried: NA  -Current home readings: Does not routinely monitor -Denies hypotensive/hypertensive symptoms -Recommended to continue current medication  Atrial Fibrillation (Goal: prevent stroke and major bleeding) -Controlled -CHADSVASC: 9 -Current treatment: Rate control:  Amiodarone 200 mg twice daily  Metoprolol XL 50 mg daily  Anticoagulation: Eliquis 5 mg twice daily  -Medications previously tried: NA -Recommended to continue current medication  Hyperlipidemia: (LDL goal < 70) -Controlled -Current treatment: Atorvastatin 40 mg nightly  -Medications previously tried: NA  -Recommended to continue current medication  Diabetes (A1c goal <7%) -Controlled -Current medications: Farxiga 10 mg daily before breakfast -Medications previously tried: Ozempic/Wegovy  (Cost)  -Current home glucose readings -Denies hypoglycemic/hyperglycemic symptoms -Recommended to continue current medication  Anxiety (Goal: Achieve symptom remission) -Controlled -Current treatment: Escitalopram 5 mg daily  Trazodone 50 mg 0.5-1 tablet nightly  -Medications previously tried/failed: NA -PHQ9: 0 -GAD7: 0 -Recommended to continue current medication  Hypothyroidism (Goal: Achieve thyroid function) -Controlled -Current treatment  Levothyroxine 50 mcg daily  -Medications previously tried: NA  -Recommended to continue current medication  Chronic Pain (Goal: Maintain adequate pain relief) -Controlled -Current treatment  Acetaminophen  Oxycodone 10 mg four times daily  -Medications previously tried: NA  -Recommended to continue current medication  Patient Goals/Self-Care Activities Patient will:  - check blood pressure weekly, document, and provide at future appointments  Follow Up Plan: Telephone follow up appointment with care management team member scheduled for:  10/08/22 at 11:00 AM      Medication Assistance: Application for Farxiga, Eliquis  medication assistance program. in process.  Anticipated assistance start date TBD.  See plan of care for additional detail.  Compliance/Adherence/Medication fill history: Care Gaps: Influenza Vaccine  Star-Rating Drugs: Atorvastatin 40 mg last filled on 04/28/2022 for a 90-Day supply  Farxiga 10 mg last filled 04/30/2022 for a 30-Day supply  Patient's preferred pharmacy is:  [id number] [place], [id number]: [phone]: [phone] Uses pill box? No Pt endorses 100% compliance  We discussed: Current pharmacy is preferred with insurance plan and patient is satisfied with pharmacy services Patient decided to: Continue current medication management strategy  Care Plan and Follow Up Patient Decision:  Patient agrees to Care Plan and  Follow-up.  Plan: Telephone follow up appointment with care management team member scheduled for:  10/08/22 at 11:00 AM  AMalva Limes CPekinPharmacist Practitioner  [phone]

## 2022-05-29 NOTE — Patient Instructions (Signed)
Visit Information It was great speaking with you today!  Please let me know if you have any questions about our visit.  Patient Care Plan: General Pharmacy (Adult)     Problem Identified: Hypertension, Hyperlipidemia, Diabetes, Atrial Fibrillation, Coronary Artery Disease, Chronic Kidney Disease, Osteoarthritis, and Peripheral Artery Disease, Obesity   Priority: High     Long-Range Goal: Patient-Specific Goal   Start Date: 05/29/2022  Expected End Date: 05/30/2023  This Visit's Progress: On track  Priority: High  Note:   Current Barriers:  Unable to independently afford treatment regimen  Pharmacist Clinical Goal(s):  Patient will verbalize ability to afford treatment regimen through collaboration with PharmD and provider.   Interventions: 1:1 collaboration with [name], MD regarding development and update of comprehensive plan of care as evidenced by provider attestation and co-signature Inter-disciplinary care team collaboration (see longitudinal plan of care) Comprehensive medication review performed; medication list updated in electronic medical record  Hypertension (BP goal <130/80) -Controlled -Current treatment: Metoprolol XL 50 mg daily  Torsemide 20 mg daily  -Medications previously tried: NA  -Current home readings: Does not routinely monitor -Denies hypotensive/hypertensive symptoms -Recommended to continue current medication  Atrial Fibrillation (Goal: prevent stroke and major bleeding) -Controlled -CHADSVASC: 9 -Current treatment: Rate control:  Amiodarone 200 mg twice daily  Metoprolol XL 50 mg daily  Anticoagulation: Eliquis 5 mg twice daily  -Medications previously tried: NA -Recommended to continue current medication  Hyperlipidemia: (LDL goal < 70) -Controlled -Current treatment: Atorvastatin 40 mg nightly  -Medications previously tried: NA  -Recommended to continue current medication  Diabetes (A1c goal <7%) -Controlled -Current  medications: Farxiga 10 mg daily before breakfast -Medications previously tried: Ozempic/Wegovy (Cost)  -Current home glucose readings -Denies hypoglycemic/hyperglycemic symptoms -Recommended to continue current medication  Anxiety (Goal: Achieve symptom remission) -Controlled -Current treatment: Escitalopram 5 mg daily  Trazodone 50 mg 0.5-1 tablet nightly  -Medications previously tried/failed: NA -PHQ9: 0 -GAD7: 0 -Recommended to continue current medication  Hypothyroidism (Goal: Achieve thyroid function) -Controlled -Current treatment  Levothyroxine 50 mcg daily  -Medications previously tried: NA  -Recommended to continue current medication  Chronic Pain (Goal: Maintain adequate pain relief) -Controlled -Current treatment  Acetaminophen  Oxycodone 10 mg four times daily  -Medications previously tried: NA  -Recommended to continue current medication  Patient Goals/Self-Care Activities Patient will:  - check blood pressure weekly, document, and provide at future appointments  Follow Up Plan: Telephone follow up appointment with care management team member scheduled for:  10/08/22 at 11:00 AM      Patient agreed to services and verbal consent obtained.   Patient verbalizes understanding of instructions and care plan provided today and agrees to view in [place]. Active MyChart status and patient understanding of how to access instructions and care plan via MyChart confirmed with patient.     Malva Limes, [place] Pharmacist Practitioner  [facility] [phone]

## 2022-05-29 NOTE — Progress Notes (Unsigned)
Name: Becky Trevino   MRN: [id number]    DOB: Dec 13, 1945   Date:05/30/2022       Progress Note  Subjective  Chief Complaint  Follow Up  HPI  DMII with renal manifestation and obesity:  she denies polyuria, polyphagia or polydipsia. A1Cis normal   She has CKI - last GFR 37 back in May, urine micro negative. She also has  dyslipidemia and obesity . She cannot take ACE due to angioedema, A1C is at goal, taking [place] for kidney . She states statins for dyslipidemia. She has neuropathy and sees Podiatrist on a regular basis.    Obesity Morbid: we got her weight today and is down from 242 lbs to 322 lbs, she was swollen last visit and weight is now back to her baseline weight of May 2023     Chronic DVT  right popliteal vein: diagnosed 10/27/2019,  she has an IV filter. Dr. Tasia Catchings had stopped Eliquis but had psoas hematoma but since resolution of hematoma Dr. Clayborn Bigness placed her back on it . She also has pulmonary hypertension but has not been wearing CPAP lately    Hyperlipidemia: taking Atorvastatin daily and denies side effects. No chest pain , no muscles pain. Last LDL was at goal    Insomnia/OSA :Last Echo done by Dr. Clayborn Bigness showed mild pulmonary hypertension . Reminded her to resume CPAP , her machine is old, she will send a new order for new supplies. Takes Trazodone prn only    OA: multiple joints, sees Ortho, Dr. Rudene Christians . She had revision of right knee replacement March 2019 because of infection. Still under the care of Ortho now also seeing Dr. Ola Spurr - ID ( appointment end of Sep) . She is still on daily antibiotics - Amoxicillin    Left femoral nerve palsy:  seen by Dr. Rudene Christians, Dr. Manuella Ghazi, now seeing neurosurgeon at [facility] , Dr. Tamala Julian. She is able to walk short distances but s wearing brace because left knee is unstable due to femoral nerve palsy. She still  needs assistance bathing, cannot drive, cannot transfer alone . She was diagnosed with a hematoma of left iliopsoas during hospital  stay 10/2019 , but she had repeat  CT pelvis 01/09/2020 that showed resolution of hematoma. She is back on Eliquis and off aspirin now  Dr. Tamala Julian reviewed her case and advised against doing the surgery to decompress the nerve. She continues to have lower extremity edema that improves with massage She went to vascular surgeon and was advised to get the compression boots to wear for one hour twice a day but still has not received it, she wears , she wears velcro compression hoses    CKI stage III: GFR dropped to 37, we will recheck in 4 -6 weeks , currently on Farxiga , BP is low and not take ACE or ARB plus allergic to ACE      Atherosclerosis of Aorta/Afib/PAD: she is on statin and back on Eliquis Denies side effects Last LDL at goal . Rate controlled . Unsure if symptoms of PAD since using wheelchair   Hypothyroidism: on levothyroxine and we will recheck TSH today, last level was over 12, we adjusted levothyroxine to 50 mcg daily but she just started the new dose and will return in 4-6 weeks for labs only . Denies hair loss, she has dry skin - but stable  Anxiety: she was placed on lexapro due to episodes of panic attack, she is doing well, and denies side effects  Patient Active Problem List   Diagnosis Date Noted   Hypothyroidism (acquired) 01/27/2022   PAD (peripheral artery disease) ([place]) 09/27/2021   AF (paroxysmal atrial fibrillation) ([place]) 09/15/2021   Primary osteoarthritis of right hip 07/18/2021   Atherosclerosis of aorta ([place]) 06/07/2021   Mild protein-calorie malnutrition ([place])    Hyperbilirubinemia    Injury of left femoral nerve 08/28/2020   Lymphedema 06/25/2020   History of pelvic hematoma 02/10/2020   Chronic venous hypertension due to deep vein thrombosis (DVT) 10/30/2019   Normocytic anemia 08/07/2019   Chronic infection of right knee ([place]) 11/25/2018   Infection and inflammatory reaction due to internal joint prosthesis, subsequent encounter 01/25/2018   Infection of  total right knee replacement ([place]) 11/19/2017   Type 2 diabetes mellitus with stage 3b chronic kidney disease, without long-term current use of insulin ([place]) 11/06/2016   Primary osteoarthritis of knee 03/11/2016   Primary osteoarthritis of left hip 12/11/2015   Essential hypertension 04/21/2015   Chronic kidney disease (CKD), stage III (moderate) ([place]) 04/21/2015   Chronic venous insufficiency 04/21/2015   DDD (degenerative disc disease), cervical 04/21/2015   Diabetes mellitus with renal manifestation ([place]) 04/21/2015   Hyperlipidemia 04/21/2015   Bladder cystocele 04/21/2015   Urinary incontinence 04/21/2015   Leg varices 04/21/2015   Insomnia 04/21/2015   Eczema intertrigo 04/21/2015   Obesity, Class III, BMI 40-49.9 (morbid obesity) ([place]) 04/21/2015   OSA (obstructive sleep apnea) 04/21/2015   Osteoarthritis of multiple joints 04/21/2015   H/O Spinal surgery 11/14/2013   Detrusor muscle hypertonia 11/07/2013    Past Surgical History:  Procedure Laterality Date   ABDOMINAL HYSTERECTOMY  1993   Total   BUNIONECTOMY Bilateral [place] Right 11/19/2017   Procedure: RIGHT KNEE POLY EXCHANGE WITH IRRIGATION AND DEBRIDEMENT;  Surgeon: Hessie Knows, MD;  Location: [facility];  Service: Orthopedics;  Laterality: Right;   IR FLUORO GUIDED NEEDLE PLC ASPIRATION/INJECTION LOC  09/19/2021   JOINT REPLACEMENT     bilateral knee   LAMINECTOMY  11/14/2013    Cervical Fusion , [facility], Dr. Delilah Shan   LASER ABLATION Bilateral 07/29/2012   Dr. Lucky Cowboy   LOWER EXTREMITY ANGIOGRAPHY Right 11/02/2019   Procedure: Lower Extremity Angiography;  Surgeon: Katha Cabal, MD;  Location: [place] [facility];  Service: Cardiovascular;  Laterality: Right;   ORIF ANKLE FRACTURE Right 09/02/2019   Procedure: OPEN REDUCTION INTERNAL FIXATION (ORIF) ANKLE FRACTURE BIMALLEOLAR;  Surgeon: [name], DPM;  Location: [facility];  Service: Podiatry;  Laterality: Right;   PERIPHERAL VASCULAR  THROMBECTOMY Right 03/02/2020   Procedure: PERIPHERAL VASCULAR THROMBECTOMY;  Surgeon: Algernon Huxley, MD;  Location: [place] [facility];  Service: Cardiovascular;  Laterality: Right;   REPLACEMENT TOTAL KNEE Left 03/2010   Dr. Rudene Christians   TEE WITHOUT CARDIOVERSION N/A 09/19/2021   Procedure: TRANSESOPHAGEAL ECHOCARDIOGRAM (TEE);  Surgeon: Corey Skains, MD;  Location: [facility];  Service: Cardiovascular;  Laterality: N/A;   TOTAL HIP ARTHROPLASTY Left 12/11/2015   Procedure: TOTAL HIP ARTHROPLASTY ANTERIOR APPROACH;  Surgeon: Hessie Knows, MD;  Location: [facility];  Service: Orthopedics;  Laterality: Left;   TOTAL KNEE ARTHROPLASTY Right 03/11/2016   Procedure: TOTAL KNEE ARTHROPLASTY;  Surgeon: Hessie Knows, MD;  Location: [facility];  Service: Orthopedics;  Laterality: Right;    Family History  Problem Relation Age of Onset   Diabetes Mother    Hyperlipidemia Mother    Hypertension Mother    Diabetes Father    Hyperlipidemia Father  Hypertension Father    Obesity Father    Hypertension Sister    Hyperlipidemia Sister    Hyperlipidemia Sister    Breast cancer Neg Hx     Social History   Tobacco Use   Smoking status: Never   Smokeless tobacco: Never  Substance Use Topics   Alcohol use: No    Alcohol/week: 0.0 standard drinks of alcohol     Current Outpatient Medications:    ACCU-CHEK GUIDE test strip, TEST ONCE A DAY AS DIRECTED, Disp: 50 strip, Rfl: 1   acetaminophen (TYLENOL) 650 MG CR tablet, Take 650-1,300 mg by mouth 2 (two) times daily as needed for pain., Disp: , Rfl:    amiodarone (PACERONE) 200 MG tablet, Take 200 mg by mouth daily., Disp: , Rfl:    amoxicillin (AMOXIL) 500 MG capsule, Take 500 mg by mouth 2 (two) times daily., Disp: , Rfl:    apixaban (ELIQUIS) 5 MG TABS tablet, Take 1 tablet by mouth 2 (two) times daily., Disp: , Rfl:    atorvastatin (LIPITOR) 40 MG tablet, Take 1 tablet (40 mg total) by mouth every evening., Disp: 90 tablet, Rfl: 1   blood glucose  meter kit and supplies, 1 each by Other route daily at 12 noon. Dispense based on patient and insurance preference. Check fsbs once a day, Disp: 1 each, Rfl: 0   Calcium Carbonate-Vitamin D 600-400 MG-UNIT tablet, Take 1 tablet by mouth 2 (two) times daily., Disp: 60 tablet, Rfl: 0   dapagliflozin propanediol (FARXIGA) 10 MG TABS tablet, Take 1 tablet (10 mg total) by mouth daily before breakfast., Disp: 90 tablet, Rfl: 1   escitalopram (LEXAPRO) 5 MG tablet, Take 1 tablet (5 mg total) by mouth daily., Disp: 90 tablet, Rfl: 1   fluticasone (FLONASE) 50 MCG/ACT nasal spray, SPRAY 2 SPRAYS INTO EACH NOSTRIL EVERY DAY, Disp: 48 mL, Rfl: 0   latanoprost (XALATAN) 0.005 % ophthalmic solution, SMARTSIG:In Eye(s), Disp: , Rfl:    levothyroxine (SYNTHROID) 50 MCG tablet, Take 1 tablet (50 mcg total) by mouth daily., Disp: 90 tablet, Rfl: 0   Magnesium Oxide 500 MG CAPS, Take 500 mg by mouth daily. , Disp: , Rfl:    metoprolol succinate (TOPROL-XL) 50 MG 24 hr tablet, Take 50 mg by mouth daily., Disp: , Rfl:    mirabegron ER (MYRBETRIQ) 50 MG TB24 tablet, Take 1 tablet (50 mg total) by mouth daily., Disp: 90 tablet, Rfl: 3   Oxycodone HCl 10 MG TABS, Take 1 tablet by mouth 4 (four) times daily as needed., Disp: , Rfl:    polyethylene glycol (MIRALAX / GLYCOLAX) 17 g packet, Take 17 g by mouth daily. (Patient taking differently: Take 17 g by mouth every other day.), Disp: 14 each, Rfl: 0   potassium chloride (KLOR-CON) 10 MEQ tablet, Take 10 mEq by mouth daily., Disp: , Rfl:    SIMBRINZA 1-0.2 % SUSP, Place 1 drop into both eyes 2 (two) times daily., Disp: , Rfl:    sodium chloride (OCEAN) 0.65 % SOLN nasal spray, Place 1 spray into both nostrils 3 (three) times daily., Disp: 30 mL, Rfl: 0   torsemide (DEMADEX) 20 MG tablet, TAKE 1 TABLET BY MOUTH EVERY DAY, Disp: 90 tablet, Rfl: 1   traZODone (DESYREL) 50 MG tablet, Take 0.5-1 tablets (25-50 mg total) by mouth at bedtime as needed. for sleep, Disp: 90 tablet,  Rfl: 0  Allergies  Allergen Reactions   Lisinopril Swelling    I personally reviewed active problem list, medication list,  allergies, family history, social history, health maintenance with the patient/caregiver today.   ROS  Constitutional: Negative for fever positive for  weight change - she had gained but back to weight from May   Respiratory: Negative for cough and shortness of breath.   Cardiovascular: Negative for chest pain or palpitations.  Gastrointestinal: Negative for abdominal pain, no bowel changes.  Musculoskeletal: Positive  for gait problem and  joint swelling.  Skin: Negative for rash.  Neurological: Negative for dizziness or headache.  No other specific complaints in a complete review of systems (except as listed in HPI above).   Objective  Vitals:   05/30/22 0940  BP: 118/70  Pulse: 62  Resp: 16  SpO2: 98%  Weight: (!) 322 lb (146.1 kg)  Height: 5' 11"  (1.803 m)    Body mass index is 44.91 kg/m.  Physical Exam  Constitutional: Patient appears well-developed and well-nourished. Obese  No distress.  HEENT: head atraumatic, normocephalic, pupils equal and reactive to light, neck supple Cardiovascular: Normal rate, regular rhythm and normal heart sounds.  No murmur heard. Positive for  BLE edema. Pulmonary/Chest: Effort normal and breath sounds normal. No respiratory distress. Abdominal: Soft.  There is no tenderness. Muscular Skeletal: still swollen on right thigh but not as tense, wearing compression stocking hoses Psychiatric: Patient has a normal mood and affect. behavior is normal. Judgment and thought content normal.   Recent Results (from the past 2160 hour(s))  POCT HgB A1C     Status: None   Collection Time: 04/30/22 10:30 AM  Result Value Ref Range   Hemoglobin A1C 5.2 4.0 - 5.6 %   HbA1c POC (<> result, manual entry)     HbA1c, POC (prediabetic range)     HbA1c, POC (controlled diabetic range)    TSH     Status: Abnormal   Collection  Time: 04/30/22 11:12 AM  Result Value Ref Range   TSH 12.96 (H) 0.40 - 4.50 mIU/L  HM DIABETES EYE EXAM     Status: None   Collection Time: 05/20/22 12:00 AM  Result Value Ref Range   HM Diabetic Eye Exam No Retinopathy No Retinopathy    Comment: Result scanned in chart     PHQ2/9:    05/30/2022    9:39 AM 04/30/2022   10:27 AM 01/27/2022   10:24 AM 12/17/2021   12:59 PM 12/17/2021   10:53 AM  Depression screen PHQ 2/9  Decreased Interest 0 0 0 0 0  Down, Depressed, Hopeless 0 0 0 0 0  PHQ - 2 Score 0 0 0 0 0  Altered sleeping 0  0 0 0  Tired, decreased energy 0  0 0 0  Change in appetite 0  0 0 0  Feeling bad or failure about yourself  0  0 0 0  Trouble concentrating 0  0 0 0  Moving slowly or fidgety/restless 0  0 0 0  Suicidal thoughts 0  0 0 0  PHQ-9 Score 0  0 0 0  Difficult doing work/chores    Not difficult at all     phq 9 is negative   Fall Risk:    05/30/2022    9:39 AM 04/30/2022   10:27 AM 01/27/2022   10:24 AM 12/17/2021   12:58 PM 12/17/2021   10:54 AM  Fall Risk   Falls in the past year? 0 0 0 0 0  Number falls in past yr: 0 0  0   Injury with Fall? 0 0  0   Risk for fall due to : Impaired balance/gait;Impaired mobility Impaired mobility;Impaired balance/gait Impaired balance/gait;Impaired mobility    Follow up Falls prevention discussed Falls prevention discussed Falls prevention discussed;Falls evaluation completed;Education provided  Falls evaluation completed      Functional Status Survey: Is the patient deaf or have difficulty hearing?: No Does the patient have difficulty seeing, even when wearing glasses/contacts?: No Does the patient have difficulty concentrating, remembering, or making decisions?: No Does the patient have difficulty walking or climbing stairs?: Yes Does the patient have difficulty dressing or bathing?: Yes Does the patient have difficulty doing errands alone such as visiting a doctor's office or shopping?: Yes    Assessment &  Plan  1. Need for immunization against influenza  - Flu Vaccine QUAD High Dose(Fluad)  2. Anxiety  - escitalopram (LEXAPRO) 5 MG tablet; Take 1 tablet (5 mg total) by mouth daily.  Dispense: 90 tablet; Refill: 1  3. Perennial allergic rhinitis  - fluticasone (FLONASE) 50 MCG/ACT nasal spray; SPRAY 2 SPRAYS INTO EACH NOSTRIL EVERY DAY  Dispense: 48 mL; Refill: 0  4. Dyslipidemia  - atorvastatin (LIPITOR) 40 MG tablet; Take 1 tablet (40 mg total) by mouth every evening.  Dispense: 90 tablet; Refill: 1  5. Other insomnia  - traZODone (DESYREL) 50 MG tablet; Take 0.5-1 tablets (25-50 mg total) by mouth at bedtime as needed. for sleep  Dispense: 90 tablet; Refill: 0  6. AF (paroxysmal atrial fibrillation) (HCC)  Rate controlled  7. PAD (peripheral artery disease) ([place])   8. Atherosclerosis of aorta ([place])  On statin therapy   9. Type 2 diabetes mellitus with stage 3b chronic kidney disease, without long-term current use of insulin (HCC)  - BASIC METABOLIC PANEL WITH GFR  10. Hypothyroidism (acquired)  - TSH

## 2022-05-30 ENCOUNTER — Encounter: Payer: Self-pay | Admitting: Family Medicine

## 2022-05-30 ENCOUNTER — Ambulatory Visit: Payer: Medicare Other | Admitting: Family Medicine

## 2022-05-30 VITALS — BP 118/70 | HR 62 | Resp 16 | Ht 71.0 in | Wt 322.0 lb

## 2022-05-30 DIAGNOSIS — J3089 Other allergic rhinitis: Secondary | ICD-10-CM

## 2022-05-30 DIAGNOSIS — G4709 Other insomnia: Secondary | ICD-10-CM

## 2022-05-30 DIAGNOSIS — E1122 Type 2 diabetes mellitus with diabetic chronic kidney disease: Secondary | ICD-10-CM

## 2022-05-30 DIAGNOSIS — F419 Anxiety disorder, unspecified: Secondary | ICD-10-CM

## 2022-05-30 DIAGNOSIS — I480 Paroxysmal atrial fibrillation: Secondary | ICD-10-CM | POA: Diagnosis not present

## 2022-05-30 DIAGNOSIS — Z23 Encounter for immunization: Secondary | ICD-10-CM

## 2022-05-30 DIAGNOSIS — I739 Peripheral vascular disease, unspecified: Secondary | ICD-10-CM

## 2022-05-30 DIAGNOSIS — N1832 Chronic kidney disease, stage 3b: Secondary | ICD-10-CM

## 2022-05-30 DIAGNOSIS — I700 Atherosclerosis of aorta: Secondary | ICD-10-CM

## 2022-05-30 DIAGNOSIS — E785 Hyperlipidemia, unspecified: Secondary | ICD-10-CM

## 2022-05-30 DIAGNOSIS — E039 Hypothyroidism, unspecified: Secondary | ICD-10-CM

## 2022-05-30 MED ORDER — FLUTICASONE PROPIONATE 50 MCG/ACT NA SUSP: NASAL | 0 refills | Status: DC

## 2022-05-30 MED ORDER — TRAZODONE HCL 50 MG PO TABS: 25.0000 mg | ORAL_TABLET | Freq: Every evening | ORAL | 0 refills | Status: DC | PRN

## 2022-05-30 MED ORDER — ATORVASTATIN CALCIUM 40 MG PO TABS: 40.0000 mg | ORAL_TABLET | Freq: Every evening | ORAL | 1 refills | Status: DC

## 2022-05-30 MED ORDER — ESCITALOPRAM OXALATE 5 MG PO TABS: 5.0000 mg | ORAL_TABLET | Freq: Every day | ORAL | 1 refills | Status: DC

## 2022-05-30 NOTE — Patient Instructions (Signed)
RSV and COVID vaccine at pharmacy

## 2022-05-31 LAB — BASIC METABOLIC PANEL WITH GFR
BUN/Creatinine Ratio: 14 (calc) (ref 6–22)
BUN: 18 mg/dL (ref 7–25)
CO2: 30 mmol/L (ref 20–32)
Calcium: 8.8 mg/dL (ref 8.6–10.4)
Chloride: 102 mmol/L (ref 98–110)
Creat: 1.33 mg/dL — ABNORMAL HIGH (ref 0.60–1.00)
Glucose, Bld: 91 mg/dL (ref 65–99)
Potassium: 4.2 mmol/L (ref 3.5–5.3)
Sodium: 138 mmol/L (ref 135–146)
eGFR: 41 mL/min/{1.73_m2} — ABNORMAL LOW (ref >=60)

## 2022-05-31 LAB — TSH: TSH: 8.12 mIU/L — ABNORMAL HIGH (ref 0.40–4.50)

## 2022-06-03 ENCOUNTER — Telehealth: Payer: Self-pay

## 2022-06-03 DIAGNOSIS — E1140 Type 2 diabetes mellitus with diabetic neuropathy, unspecified: Secondary | ICD-10-CM

## 2022-06-03 DIAGNOSIS — N1832 Chronic kidney disease, stage 3b: Secondary | ICD-10-CM

## 2022-06-03 NOTE — Progress Notes (Signed)
Chronic Care Management Pharmacy Assistant   Name: Becky Trevino  MRN: [id number] DOB: April 05, 1946  Reason for Encounter:  Patient Assistance Applications for [place] and Eliquis   I received a task from Becky Trevino, [name] requesting that I start patient assistance applications for the patient's Becky Trevino and her Eliquis.  Application for Becky Trevino can be completed via the [employer] website. I will complete the patient's application online today for her Becky Trevino.  I was able to submit the patient's patient assistance application via [employer] successfully. The patient has been enrolled into the AZ[employer] Program as of 06/03/2022 Ending Enrollment will be 09/14/2022. Task sent to [name] to inform him of approval and to have him submit over the prescription to [employer] for processing of patient's first shipment.  Patient's application for Eliquis started and will be mailed to the patient's home for completion. The patient will need to take this application to her Cardiologist to complete. Patient will need to provide full application, proof of her income and a copy of her out-of-pocket expense report from her pharmacy so the Cardiologist can fax over application once they have completed their portion. Patient is encouraged to give me a call if she has any questions regarding her application directly at [phone].  Tried calling the patient but had to leave a VM on patient's cell phone requesting a return call. I tried patient's home number but had to LVM on home phone as well.   09/20  I spoke with the patient and informed her that she was approved for her patient assistance for Farxiga. I informed the patient that once [name] faxes over her prescription to [employer] she should receive her first shipment in 7-10 business days.   I informed the patient that for the Eliquis PAP she would need to take the application along  with her proof of income and out-of pocket expense report to her Cardiologist as the Cardiologist  will need to complete the application and they can fax it over for her or if she would like she can drop all paperwork off to Becky Trevino and he can fax it over.  I also informed the patient that for her Myrbetriq that [name] only does patient assistance for this medication if the patient has no coverage at all. Per the [name] if the patient has coverage for this medication they will not qualify for their program.   Per [name] the patient was advised she can request her Urologist switch her to [facility] as there is patient assistance for this medication, and I also informed she can also inquire with Urology if she doesn't want to switch if they can do a Tier Reduction through [facility] to see if they will approve it.  Patient also inquired about patient assistance for Simbrinza eye drops, and I informed her there is no program in place for these drops but she can have the provider who ordered the drops also apply for a Tier Reduction through [name] on her behalf to see if they will approve it.   Patient instructed to give me a call directly at [phone] if she has any questions.   Medications: Outpatient Encounter Medications as of 06/03/2022  Medication Sig   ACCU-CHEK GUIDE test strip TEST ONCE A DAY AS DIRECTED   acetaminophen (TYLENOL) 650 MG CR tablet Take 650-1,300 mg by mouth 2 (two) times daily as needed for pain.   amiodarone (PACERONE) 200 MG tablet Take 200 mg by mouth daily.   amoxicillin (AMOXIL) 500 MG capsule  Take 500 mg by mouth 2 (two) times daily.   apixaban (ELIQUIS) 5 MG TABS tablet Take 1 tablet by mouth 2 (two) times daily.   atorvastatin (LIPITOR) 40 MG tablet Take 1 tablet (40 mg total) by mouth every evening.   blood glucose meter kit and supplies 1 each by Other route daily at 12 noon. Dispense based on patient and insurance preference. Check fsbs once a day   Calcium Carbonate-Vitamin D 600-400 MG-UNIT tablet Take 1 tablet by mouth 2 (two) times daily.   dapagliflozin  propanediol (FARXIGA) 10 MG TABS tablet Take 1 tablet (10 mg total) by mouth daily before breakfast.   escitalopram (LEXAPRO) 5 MG tablet Take 1 tablet (5 mg total) by mouth daily.   fluticasone (FLONASE) 50 MCG/ACT nasal spray SPRAY 2 SPRAYS INTO EACH NOSTRIL EVERY DAY   latanoprost (XALATAN) 0.005 % ophthalmic solution SMARTSIG:In Eye(s)   levothyroxine (SYNTHROID) 50 MCG tablet Take 1 tablet (50 mcg total) by mouth daily.   Magnesium Oxide 500 MG CAPS Take 500 mg by mouth daily.    metoprolol succinate (TOPROL-XL) 50 MG 24 hr tablet Take 50 mg by mouth daily.   mirabegron ER (MYRBETRIQ) 50 MG TB24 tablet Take 1 tablet (50 mg total) by mouth daily.   Oxycodone HCl 10 MG TABS Take 1 tablet by mouth 4 (four) times daily as needed.   polyethylene glycol (MIRALAX / GLYCOLAX) 17 g packet Take 17 g by mouth daily. (Patient taking differently: Take 17 g by mouth every other day.)   potassium chloride (KLOR-CON) 10 MEQ tablet Take 10 mEq by mouth daily.   SIMBRINZA 1-0.2 % SUSP Place 1 drop into both eyes 2 (two) times daily.   sodium chloride (OCEAN) 0.65 % SOLN nasal spray Place 1 spray into both nostrils 3 (three) times daily.   torsemide (DEMADEX) 20 MG tablet TAKE 1 TABLET BY MOUTH EVERY DAY   traZODone (DESYREL) 50 MG tablet Take 0.5-1 tablets (25-50 mg total) by mouth at bedtime as needed. for sleep   No facility-administered encounter medications on file as of 06/03/2022.    Becky Trevino, CPA/CMA Clinical Pharmacist Assistant Phone: [phone]

## 2022-06-04 MED ORDER — DAPAGLIFLOZIN PROPANEDIOL 10 MG PO TABS: 10.0000 mg | ORAL_TABLET | Freq: Every day | ORAL | 3 refills | Status: DC

## 2022-06-07 ENCOUNTER — Other Ambulatory Visit: Payer: Self-pay | Admitting: Family Medicine

## 2022-06-12 ENCOUNTER — Telehealth: Payer: Self-pay | Admitting: Family Medicine

## 2022-06-12 NOTE — Telephone Encounter (Signed)
Pt was advised by Pharmacy that escitalopram (LEXAPRO) 5 MG tablet can cause bleeding and irregular heart beat when also taking Eliquis and Aspirin / pt would like Dr. Ancil Boozer to call pharmacy for clarity / she stated they wrote down a couple other things but she can not make out what it says/ please advise

## 2022-06-13 NOTE — Telephone Encounter (Signed)
Pt will continue

## 2022-06-14 DIAGNOSIS — E785 Hyperlipidemia, unspecified: Secondary | ICD-10-CM | POA: Diagnosis not present

## 2022-06-14 DIAGNOSIS — I10 Essential (primary) hypertension: Secondary | ICD-10-CM | POA: Diagnosis not present

## 2022-06-14 DIAGNOSIS — E1159 Type 2 diabetes mellitus with other circulatory complications: Secondary | ICD-10-CM

## 2022-06-14 DIAGNOSIS — I4891 Unspecified atrial fibrillation: Secondary | ICD-10-CM | POA: Diagnosis not present

## 2022-06-14 DIAGNOSIS — E039 Hypothyroidism, unspecified: Secondary | ICD-10-CM

## 2022-06-19 ENCOUNTER — Ambulatory Visit: Payer: Medicare Other | Attending: Infectious Diseases | Admitting: Infectious Diseases

## 2022-06-19 VITALS — BP 121/73 | HR 56 | Temp 97.9°F

## 2022-06-19 DIAGNOSIS — E039 Hypothyroidism, unspecified: Secondary | ICD-10-CM | POA: Diagnosis not present

## 2022-06-19 DIAGNOSIS — R7881 Bacteremia: Secondary | ICD-10-CM | POA: Insufficient documentation

## 2022-06-19 DIAGNOSIS — Z7901 Long term (current) use of anticoagulants: Secondary | ICD-10-CM | POA: Diagnosis not present

## 2022-06-19 DIAGNOSIS — Z79890 Hormone replacement therapy (postmenopausal): Secondary | ICD-10-CM | POA: Insufficient documentation

## 2022-06-19 DIAGNOSIS — Y828 Other medical devices associated with adverse incidents: Secondary | ICD-10-CM | POA: Diagnosis not present

## 2022-06-19 DIAGNOSIS — N1832 Chronic kidney disease, stage 3b: Secondary | ICD-10-CM | POA: Diagnosis not present

## 2022-06-19 DIAGNOSIS — E785 Hyperlipidemia, unspecified: Secondary | ICD-10-CM | POA: Diagnosis not present

## 2022-06-19 DIAGNOSIS — Z8614 Personal history of Methicillin resistant Staphylococcus aureus infection: Secondary | ICD-10-CM | POA: Insufficient documentation

## 2022-06-19 DIAGNOSIS — I480 Paroxysmal atrial fibrillation: Secondary | ICD-10-CM | POA: Diagnosis not present

## 2022-06-19 DIAGNOSIS — T8453XD Infection and inflammatory reaction due to internal right knee prosthesis, subsequent encounter: Secondary | ICD-10-CM

## 2022-06-19 DIAGNOSIS — Z86718 Personal history of other venous thrombosis and embolism: Secondary | ICD-10-CM | POA: Diagnosis not present

## 2022-06-19 DIAGNOSIS — Z96653 Presence of artificial knee joint, bilateral: Secondary | ICD-10-CM | POA: Diagnosis not present

## 2022-06-19 DIAGNOSIS — Z79899 Other long term (current) drug therapy: Secondary | ICD-10-CM | POA: Diagnosis not present

## 2022-06-19 DIAGNOSIS — E1122 Type 2 diabetes mellitus with diabetic chronic kidney disease: Secondary | ICD-10-CM | POA: Insufficient documentation

## 2022-06-19 DIAGNOSIS — I129 Hypertensive chronic kidney disease with stage 1 through stage 4 chronic kidney disease, or unspecified chronic kidney disease: Secondary | ICD-10-CM | POA: Insufficient documentation

## 2022-06-19 DIAGNOSIS — T8450XD Infection and inflammatory reaction due to unspecified internal joint prosthesis, subsequent encounter: Secondary | ICD-10-CM | POA: Insufficient documentation

## 2022-06-19 DIAGNOSIS — Z7984 Long term (current) use of oral hypoglycemic drugs: Secondary | ICD-10-CM | POA: Diagnosis not present

## 2022-06-19 DIAGNOSIS — Z96643 Presence of artificial hip joint, bilateral: Secondary | ICD-10-CM | POA: Diagnosis not present

## 2022-06-19 MED ORDER — AMOXICILLIN 500 MG PO CAPS: 500.0000 mg | ORAL_CAPSULE | Freq: Two times a day (BID) | ORAL | 12 refills | Status: DC

## 2022-06-19 NOTE — Progress Notes (Signed)
NAME: Becky Trevino  DOB: 1946-06-26  MRN: [id number]  Date/Time: 06/19/2022 11:07 AM  Subjective:   ?pt here for follow up visit- is here with a friend She has a h/o recurrent group B streptococcal bacteremia X 3 and prosthetic joint infection- has been on suppressive amoxicillin since  Becky Trevino is a 76 y.o. 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, recurrent Group B strep bacteremia intially from the wound ORIF diabetes mellitus, hyperlipidemia, hypertension, degenerative disc , left gluteal pelvic hematoma after a fall in Nov 2020, cervical decompression surgery march 2015, DVT rt lower extremity , has IVC filter-  lymphedema legs PT was treated with 6 weeks of IV ceftriaxone which she completed on 10/26/21 for recurrent group B streptococcal bacteremia with unclear source possible rt hip septic arthritis After finishing iv ceftriaxone she has been on amoxicillin 541m TID and then BID She is doing well Her last admission was 09/14/22-09/24/21 She is using a motorized wheel chair- does walk with a walker Had rt and left leg swelling- has compression wraps and also on diuretic and is much improved She is tolerating amoxicillin- no side effects  Patient has a complicated Infection history    In 2017 had rt knee replacement   In February 2019 she had swelling of the knee and an aspiration revealed 59,000 WBCs with 98% polymorphs.  Cultures were negative as well as for crystal analysis.  Sed rate was 76.  On March 7 , 2019 she had an incision and drainage and polyethylene exchange of the right knee.  The cultures grew coag negative staph.  PICC line was placed and she was given vancomycin and ceftriaxone for 6 weeks,  , culture had coag neg staph, was on suppressive doxy + rifampin because of retained prosthesis. Since  oct 2019  has been on Doxy suppressive therapy which was discontinued Jan 2023     08/07/19-08/09/19 after  she passed out at work and hit a metal pole She developed a left gluteal hematoma and 295 ml of dark purple blood aspirated by ortho on Dec , 11 2020 - no culture sent   Feb 2021- Group B strep bacteremia Rt ankle fracture ORIF Dec 2020-  non healing wound with GBS and acinetobacter in Feb 2021- treated with 4 weeks of Iv antibiotic- unasyn   09/02/20-09/14/20 for left upper thigh cellulitis, MRSA bacteremia and group B strep bacteremia and left lower lobe infiltrate due to MRSA She got IV vanco followed by Daptomycin for a total of 22 days    07/16/21 -07/20/21 admitted for sepsis/SIRS- had epistaxis prior to admission no source identified. Blood culture neg  No pneumonia, no cellulitis, no Uti Chronic rt hip arthritis-pain- 7cc aspirated and cell count was initially reported as  73K - (37 L, 73M and 41% N) but repeat was 1300 and then 900 NO septic arthritis   09/14/21-09/24/21- hospitalized for fever and sepsis- GB strep bacteremia TEE 09/19/21  neg Rt hip aspiration -neg   Past Medical History:  Diagnosis Date   Cervical spondylosis    Chronic kidney disease    Stage 3   DDD (degenerative disc disease), cervical    [facility]   Diabetes mellitus without complication (HCC)    Diverticulosis    Hyperlipidemia    Hypertension    Intertrigo    Leg cramps    Leg varices    Obesity    OSA (  obstructive sleep apnea)    Symptomatic menopausal or female climacteric states    Syncope     Past Surgical History:  Procedure Laterality Date   ABDOMINAL HYSTERECTOMY  1993   Total   BUNIONECTOMY Bilateral 1993   I & D KNEE WITH POLY EXCHANGE Right 11/19/2017   Procedure: RIGHT KNEE POLY EXCHANGE WITH IRRIGATION AND DEBRIDEMENT;  Surgeon: Hessie Knows, MD;  Location: [facility];  Service: Orthopedics;  Laterality: Right;   IR FLUORO GUIDED NEEDLE PLC ASPIRATION/INJECTION LOC  09/19/2021   JOINT REPLACEMENT     bilateral knee   LAMINECTOMY  11/14/2013     Cervical Fusion , [facility], Dr. Delilah Shan   LASER ABLATION Bilateral 07/29/2012   Dr. Lucky Cowboy   LOWER EXTREMITY ANGIOGRAPHY Right 11/02/2019   Procedure: Lower Extremity Angiography;  Surgeon: Katha Cabal, MD;  Location: [place] [facility];  Service: Cardiovascular;  Laterality: Right;   ORIF ANKLE FRACTURE Right 09/02/2019   Procedure: OPEN REDUCTION INTERNAL FIXATION (ORIF) ANKLE FRACTURE BIMALLEOLAR;  Surgeon: [name], DPM;  Location: [facility];  Service: Podiatry;  Laterality: Right;   PERIPHERAL VASCULAR THROMBECTOMY Right 03/02/2020   Procedure: PERIPHERAL VASCULAR THROMBECTOMY;  Surgeon: Algernon Huxley, MD;  Location: [place] [facility];  Service: Cardiovascular;  Laterality: Right;   REPLACEMENT TOTAL KNEE Left 03/2010   Dr. Rudene Christians   TEE WITHOUT CARDIOVERSION N/A 09/19/2021   Procedure: TRANSESOPHAGEAL ECHOCARDIOGRAM (TEE);  Surgeon: Corey Skains, MD;  Location: [facility];  Service: Cardiovascular;  Laterality: N/A;   TOTAL HIP ARTHROPLASTY Left 12/11/2015   Procedure: TOTAL HIP ARTHROPLASTY ANTERIOR APPROACH;  Surgeon: Hessie Knows, MD;  Location: [facility];  Service: Orthopedics;  Laterality: Left;   TOTAL KNEE ARTHROPLASTY Right 03/11/2016   Procedure: TOTAL KNEE ARTHROPLASTY;  Surgeon: Hessie Knows, MD;  Location: [facility];  Service: Orthopedics;  Laterality: Right;    Social History   Socioeconomic History   Marital status: Married    Spouse name: Not on file   Number of children: 1   Years of education: Not on file   Highest education level: Not on file  Occupational History   Not on file  Tobacco Use   Smoking status: Never   Smokeless tobacco: Never  Vaping Use   Vaping Use: Never used  Substance and Sexual Activity   Alcohol use: No    Alcohol/week: 0.0 standard drinks of alcohol   Drug use: No   Sexual activity: Yes    Partners: Male  Other Topics Concern   Not on file  Social History Narrative   Married, one adopted grown child    Social Determinants of  Health   Financial Resource Strain: High Risk (05/29/2022)   Overall Financial Resource Strain (CARDIA)    Difficulty of Paying Living Expenses: Very hard  Food Insecurity: No Food Insecurity (05/28/2022)   Hunger Vital Sign    Worried About Running Out of Food in the Last Year: Never true    Ran Out of Food in the Last Year: Never true  Transportation Needs: No Transportation Needs (08/01/2021)   PRAPARE - Hydrologist (Medical): No    Lack of Transportation (Non-Medical): No  Physical Activity: Insufficiently Active (08/01/2021)   Exercise Vital Sign    Days of Exercise per Week: 2 days    Minutes of Exercise per Session: 30 min  Stress: No Stress Concern Present (08/01/2021)   [place]    Feeling of  Stress : Only a little  Social Connections: Socially Integrated (08/01/2021)   Social Connection and Isolation Panel [NHANES]    Frequency of Communication with Friends and Family: More than three times a week    Frequency of Social Gatherings with Friends and Family: More than three times a week    Attends Religious Services: More than 4 times per year    Active Member of [employer] or Organizations: Yes    Attends Music therapist: More than 4 times per year    Marital Status: Married  Human resources officer Violence: Not At Risk (08/01/2021)   Humiliation, Afraid, Rape, and Kick questionnaire    Fear of Current or Ex-Partner: No    Emotionally Abused: No    Physically Abused: No    Sexually Abused: No    Family History  Problem Relation Age of Onset   Diabetes Mother    Hyperlipidemia Mother    Hypertension Mother    Diabetes Father    Hyperlipidemia Father    Hypertension Father    Obesity Father    Hypertension Sister    Hyperlipidemia Sister    Hyperlipidemia Sister    Breast cancer Neg Hx    Allergies  Allergen Reactions   Lisinopril Swelling   I? Current  Outpatient Medications  Medication Sig Dispense Refill   ACCU-CHEK GUIDE test strip TEST ONCE A DAY AS DIRECTED 50 strip 1   acetaminophen (TYLENOL) 650 MG CR tablet Take 650-1,300 mg by mouth 2 (two) times daily as needed for pain.     amiodarone (PACERONE) 200 MG tablet Take 200 mg by mouth daily.     amoxicillin (AMOXIL) 500 MG capsule Take 500 mg by mouth 2 (two) times daily.     apixaban (ELIQUIS) 5 MG TABS tablet Take 1 tablet by mouth 2 (two) times daily.     atorvastatin (LIPITOR) 40 MG tablet Take 1 tablet (40 mg total) by mouth every evening. 90 tablet 1   blood glucose meter kit and supplies 1 each by Other route daily at 12 noon. Dispense based on patient and insurance preference. Check fsbs once a day 1 each 0   Calcium Carbonate-Vitamin D 600-400 MG-UNIT tablet Take 1 tablet by mouth 2 (two) times daily. 60 tablet 0   dapagliflozin propanediol (FARXIGA) 10 MG TABS tablet Take 1 tablet (10 mg total) by mouth daily before breakfast. Patient receives via [employer] Patient Assistance 90 tablet 3   escitalopram (LEXAPRO) 5 MG tablet Take 1 tablet (5 mg total) by mouth daily. 90 tablet 1   fluticasone (FLONASE) 50 MCG/ACT nasal spray SPRAY 2 SPRAYS INTO EACH NOSTRIL EVERY DAY 48 mL 0   latanoprost (XALATAN) 0.005 % ophthalmic solution SMARTSIG:In Eye(s)     levothyroxine (SYNTHROID) 50 MCG tablet Take 1 tablet (50 mcg total) by mouth daily. 90 tablet 0   Magnesium Oxide 500 MG CAPS Take 500 mg by mouth daily.      metoprolol succinate (TOPROL-XL) 50 MG 24 hr tablet Take 50 mg by mouth daily.     mirabegron ER (MYRBETRIQ) 50 MG TB24 tablet Take 1 tablet (50 mg total) by mouth daily. 90 tablet 3   Oxycodone HCl 10 MG TABS Take 1 tablet by mouth 4 (four) times daily as needed.     polyethylene glycol (MIRALAX / GLYCOLAX) 17 g packet Take 17 g by mouth daily. (Patient taking differently: Take 17 g by mouth every other day.) 14 each 0   potassium chloride (KLOR-CON) 10 MEQ tablet  Take 10 mEq by  mouth daily.     SIMBRINZA 1-0.2 % SUSP Place 1 drop into both eyes 2 (two) times daily.     sodium chloride (OCEAN) 0.65 % SOLN nasal spray Place 1 spray into both nostrils 3 (three) times daily. 30 mL 0   torsemide (DEMADEX) 20 MG tablet TAKE 1 TABLET BY MOUTH EVERY DAY 90 tablet 1   traZODone (DESYREL) 50 MG tablet Take 0.5-1 tablets (25-50 mg total) by mouth at bedtime as needed. for sleep 90 tablet 0   hydroquinone 4 % cream Apply topically at bedtime.     No current facility-administered medications for this visit.     Abtx:  Anti-infectives (From admission, onward)    None       REVIEW OF SYSTEMS:  Const: negative fever, negative chills, negative weight loss Eyes: negative diplopia or visual changes, negative eye pain ENT: negative coryza, negative sore throat Resp: negative cough, hemoptysis, dyspnea Cards: negative for chest pain, palpitations, lower extremity edema GU: negative for frequency, dysuria and hematuria GI: Negative for abdominal pain, diarrhea, bleeding, constipation Skin: negative for rash and pruritus Heme: negative for easy bruising and gum/nose bleeding MS: decreased mobility because of Joint disease Neurolo:negative for headaches, dizziness, vertigo, memory problems  Psych: negative for feelings of anxiety, depression  Endocrine:   diabetes Allergy/Immunology- as above ? Objective:  VITALS:  BP 121/73   Pulse (!) 56   Temp 97.9 F (36.6 C) (Temporal)   PHYSICAL EXAM:  General: Alert, cooperative, no distress, appears stated age. In wheel chair Head: Normocephalic, without obvious abnormality, atraumatic. Eyes: Conjunctivae clear, anicteric sclerae. Pupils are equal ENT Nares normal. No drainage or sinus tenderness. Lips, mucosa, and tongue normal. No Thrush Neck: Supple, symmetrical, no adenopathy, thyroid: non tender no carotid bruit and no JVD.. Lungs: Clear to auscultation bilaterally. No Wheezing or Rhonchi. No rales. Heart: Regular rate  and rhythm, no murmur, rub or gallop. Abdomen: did not examine Extremities: has compression wrap Skin:limited examination  No rashes or lesions. Or bruising Lymph: Cervical, supraclavicular normal. Neurologic: Grossly non-focal Pertinent Labs 05/30/22 BMP- cr 1.33  Impression/Recommendation ? ?Group B streptococcal bacteremia- third episode in 24 months- TEE on 09/19/21 no vegetation.  She was treated with 6 weeks of Iv ceftriaxone and is now on amoxicillin indefinitely   rt ankle  ORIF infection feb 2021 Then it was left thigh cellulitis in Nov 2021 JAn 2023- rt hip n 09/19/21- 4000 wbc ( 96% N)    H/O 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 -She was  on Doxy suppressive therapy for the rt knee PJI until Jan 2023   Left THA- clinically no evidence of infection   pelvic/gluteal hematoma after fall Nov 2020- Had cellulitis and MRSA bacteremia and GBS strep bacteremia in Dec 2021 She also had MRSA pneumonia during that hospitalization   DM     Afib on eliquis, amiodarone and metoprolol   Hypothyroidism on synthroid Follow up 12 months ? ___________________________________________________ Discussed with patient, in detail Note:  This document was prepared using Dragon voice recognition software and may include unintentional dictation errors.

## 2022-06-19 NOTE — Patient Instructions (Signed)
You are here to see me for the past h/o recurrent streptococcus infection affecting your prosthetic joints-X 4  you are on suppressive antibiotic regimen now on amoxicillin '500mg'$  PO BID. You will need this antibiotic indefinitely. Follow up 1 year

## 2022-06-23 ENCOUNTER — Telehealth: Payer: Medicare Other | Admitting: Internal Medicine

## 2022-06-24 ENCOUNTER — Ambulatory Visit: Payer: Medicare Other | Admitting: Internal Medicine

## 2022-06-24 NOTE — Progress Notes (Unsigned)
Name: Becky Trevino   MRN: [id number]    DOB: 10/24/1945   Date:06/24/2022       Progress Note  Subjective  Chief Complaint  Broken Skin  I connected with  [name]  on 06/24/22 at  9:20 AM EDT by a video enabled telemedicine application and verified that I am speaking with the correct person using two identifiers.  I discussed the limitations of evaluation and management by telemedicine and the availability of in person appointments. The patient expressed understanding and agreed to proceed with the virtual visit  Staff also discussed with the patient that there may be a patient responsible charge related to this service. Patient Location: at home  Provider Location: [facility] Additional Individuals present: husband   HPI  Decubitus ulcer: she spends most of her day sitting due to left femoral nerve palsy and history of infection of prosthetic joint . She wears depends and think it is causing the problem, explained likely from sitting most of the day. From 10 am to 5 pm daily. Discussed she needs to stand, and lay on her sides and apply barrier cream to keep the skin from breaking up   Patient Active Problem List   Diagnosis Date Noted   Hypothyroidism (acquired) 01/27/2022   PAD (peripheral artery disease) ([place]) 09/27/2021   AF (paroxysmal atrial fibrillation) ([place]) 09/15/2021   Primary osteoarthritis of right hip 07/18/2021   Atherosclerosis of aorta ([place]) 06/07/2021   Mild protein-calorie malnutrition ([place])    Hyperbilirubinemia    Injury of left femoral nerve 08/28/2020   Lymphedema 06/25/2020   History of pelvic hematoma 02/10/2020   Chronic venous hypertension due to deep vein thrombosis (DVT) 10/30/2019   Normocytic anemia 08/07/2019   Chronic infection of right knee ([place]) 11/25/2018   Infection and inflammatory reaction due to internal joint prosthesis, subsequent encounter 01/25/2018   Infection of total right knee replacement ([place]) 11/19/2017   Type 2 diabetes  mellitus with stage 3b chronic kidney disease, without long-term current use of insulin ([place]) 11/06/2016   Primary osteoarthritis of knee 03/11/2016   Primary osteoarthritis of left hip 12/11/2015   Essential hypertension 04/21/2015   Chronic kidney disease (CKD), stage III (moderate) ([place]) 04/21/2015   Chronic venous insufficiency 04/21/2015   DDD (degenerative disc disease), cervical 04/21/2015   Diabetes mellitus with renal manifestation ([place]) 04/21/2015   Hyperlipidemia 04/21/2015   Bladder cystocele 04/21/2015   Urinary incontinence 04/21/2015   Leg varices 04/21/2015   Insomnia 04/21/2015   Eczema intertrigo 04/21/2015   Obesity, Class III, BMI 40-49.9 (morbid obesity) ([place]) 04/21/2015   OSA (obstructive sleep apnea) 04/21/2015   Osteoarthritis of multiple joints 04/21/2015   H/O Spinal surgery 11/14/2013   Detrusor muscle hypertonia 11/07/2013    Past Surgical History:  Procedure Laterality Date   ABDOMINAL HYSTERECTOMY  1993   Total   BUNIONECTOMY Bilateral [place] Right 11/19/2017   Procedure: RIGHT KNEE POLY EXCHANGE WITH IRRIGATION AND DEBRIDEMENT;  Surgeon: Hessie Knows, MD;  Location: [facility];  Service: Orthopedics;  Laterality: Right;   IR FLUORO GUIDED NEEDLE PLC ASPIRATION/INJECTION LOC  09/19/2021   JOINT REPLACEMENT     bilateral knee   LAMINECTOMY  11/14/2013    Cervical Fusion , [facility], Dr. Delilah Shan   LASER ABLATION Bilateral 07/29/2012   Dr. Lucky Cowboy   LOWER EXTREMITY ANGIOGRAPHY Right 11/02/2019   Procedure: Lower Extremity Angiography;  Surgeon: Katha Cabal, MD;  Location: [facility];  Service: Cardiovascular;  Laterality: Right;   ORIF ANKLE FRACTURE Right 09/02/2019   Procedure: OPEN REDUCTION INTERNAL FIXATION (ORIF) ANKLE FRACTURE BIMALLEOLAR;  Surgeon: [name], DPM;  Location: [facility];  Service: Podiatry;  Laterality: Right;   PERIPHERAL VASCULAR THROMBECTOMY Right 03/02/2020   Procedure: PERIPHERAL VASCULAR  THROMBECTOMY;  Surgeon: Algernon Huxley, MD;  Location: [facility];  Service: Cardiovascular;  Laterality: Right;   REPLACEMENT TOTAL KNEE Left 03/2010   Dr. Rudene Christians   TEE WITHOUT CARDIOVERSION N/A 09/19/2021   Procedure: TRANSESOPHAGEAL ECHOCARDIOGRAM (TEE);  Surgeon: Corey Skains, MD;  Location: [facility];  Service: Cardiovascular;  Laterality: N/A;   TOTAL HIP ARTHROPLASTY Left 12/11/2015   Procedure: TOTAL HIP ARTHROPLASTY ANTERIOR APPROACH;  Surgeon: Hessie Knows, MD;  Location: [facility];  Service: Orthopedics;  Laterality: Left;   TOTAL KNEE ARTHROPLASTY Right 03/11/2016   Procedure: TOTAL KNEE ARTHROPLASTY;  Surgeon: Hessie Knows, MD;  Location: [facility];  Service: Orthopedics;  Laterality: Right;    Family History  Problem Relation Age of Onset   Diabetes Mother    Hyperlipidemia Mother    Hypertension Mother    Diabetes Father    Hyperlipidemia Father    Hypertension Father    Obesity Father    Hypertension Sister    Hyperlipidemia Sister    Hyperlipidemia Sister    Breast cancer Neg Hx     Social History   Socioeconomic History   Marital status: Married    Spouse name: Not on file   Number of children: 1   Years of education: Not on file   Highest education level: Not on file  Occupational History   Not on file  Tobacco Use   Smoking status: Never   Smokeless tobacco: Never  Vaping Use   Vaping Use: Never used  Substance and Sexual Activity   Alcohol use: No    Alcohol/week: 0.0 standard drinks of alcohol   Drug use: No   Sexual activity: Yes    Partners: Male  Other Topics Concern   Not on file  Social History Narrative   Married, one adopted grown child    Social Determinants of Health   Financial Resource Strain: High Risk (05/29/2022)   Overall Financial Resource Strain (CARDIA)    Difficulty of Paying Living Expenses: Very hard  Food Insecurity: No Food Insecurity (05/28/2022)   Hunger Vital Sign    Worried About Running Out of Food in the  Last Year: Never true    Ran Out of Food in the Last Year: Never true  Transportation Needs: No Transportation Needs (08/01/2021)   PRAPARE - Hydrologist (Medical): No    Lack of Transportation (Non-Medical): No  Physical Activity: Insufficiently Active (08/01/2021)   Exercise Vital Sign    Days of Exercise per Week: 2 days    Minutes of Exercise per Session: 30 min  Stress: No Stress Concern Present (08/01/2021)   [place]    Feeling of Stress : Only a little  Social Connections: Socially Integrated (08/01/2021)   Social Connection and Isolation Panel [NHANES]    Frequency of Communication with Friends and Family: More than three times a week    Frequency of Social Gatherings with Friends and Family: More than three times a week    Attends Religious Services: More than 4 times per year    Active Member of [employer] or Organizations: Yes    Attends Archivist  Meetings: More than 4 times per year    Marital Status: Married  Human resources officer Violence: Not At Risk (08/01/2021)   Humiliation, Afraid, Rape, and Kick questionnaire    Fear of Current or Ex-Partner: No    Emotionally Abused: No    Physically Abused: No    Sexually Abused: No     Current Outpatient Medications:    ACCU-CHEK GUIDE test strip, TEST ONCE A DAY AS DIRECTED, Disp: 50 strip, Rfl: 1   acetaminophen (TYLENOL) 650 MG CR tablet, Take 650-1,300 mg by mouth 2 (two) times daily as needed for pain., Disp: , Rfl:    amiodarone (PACERONE) 200 MG tablet, Take 200 mg by mouth daily., Disp: , Rfl:    amoxicillin (AMOXIL) 500 MG capsule, Take 1 capsule (500 mg total) by mouth 2 (two) times daily., Disp: 60 capsule, Rfl: 12   apixaban (ELIQUIS) 5 MG TABS tablet, Take 1 tablet by mouth 2 (two) times daily., Disp: , Rfl:    atorvastatin (LIPITOR) 40 MG tablet, Take 1 tablet (40 mg total) by mouth every evening., Disp: 90  tablet, Rfl: 1   blood glucose meter kit and supplies, 1 each by Other route daily at 12 noon. Dispense based on patient and insurance preference. Check fsbs once a day, Disp: 1 each, Rfl: 0   Calcium Carbonate-Vitamin D 600-400 MG-UNIT tablet, Take 1 tablet by mouth 2 (two) times daily., Disp: 60 tablet, Rfl: 0   dapagliflozin propanediol (FARXIGA) 10 MG TABS tablet, Take 1 tablet (10 mg total) by mouth daily before breakfast. Patient receives via [employer] Patient Assistance, Disp: 90 tablet, Rfl: 3   escitalopram (LEXAPRO) 5 MG tablet, Take 1 tablet (5 mg total) by mouth daily., Disp: 90 tablet, Rfl: 1   fluticasone (FLONASE) 50 MCG/ACT nasal spray, SPRAY 2 SPRAYS INTO EACH NOSTRIL EVERY DAY, Disp: 48 mL, Rfl: 0   hydroquinone 4 % cream, Apply topically at bedtime., Disp: , Rfl:    latanoprost (XALATAN) 0.005 % ophthalmic solution, SMARTSIG:In Eye(s), Disp: , Rfl:    levothyroxine (SYNTHROID) 50 MCG tablet, Take 1 tablet (50 mcg total) by mouth daily., Disp: 90 tablet, Rfl: 0   Magnesium Oxide 500 MG CAPS, Take 500 mg by mouth daily. , Disp: , Rfl:    metoprolol succinate (TOPROL-XL) 50 MG 24 hr tablet, Take 50 mg by mouth daily., Disp: , Rfl:    mirabegron ER (MYRBETRIQ) 50 MG TB24 tablet, Take 1 tablet (50 mg total) by mouth daily., Disp: 90 tablet, Rfl: 3   Oxycodone HCl 10 MG TABS, Take 1 tablet by mouth 4 (four) times daily as needed., Disp: , Rfl:    polyethylene glycol (MIRALAX / GLYCOLAX) 17 g packet, Take 17 g by mouth daily. (Patient taking differently: Take 17 g by mouth every other day.), Disp: 14 each, Rfl: 0   potassium chloride (KLOR-CON) 10 MEQ tablet, Take 10 mEq by mouth daily., Disp: , Rfl:    SIMBRINZA 1-0.2 % SUSP, Place 1 drop into both eyes 2 (two) times daily., Disp: , Rfl:    sodium chloride (OCEAN) 0.65 % SOLN nasal spray, Place 1 spray into both nostrils 3 (three) times daily., Disp: 30 mL, Rfl: 0   torsemide (DEMADEX) 20 MG tablet, TAKE 1 TABLET BY MOUTH EVERY DAY, Disp:  90 tablet, Rfl: 1   traZODone (DESYREL) 50 MG tablet, Take 0.5-1 tablets (25-50 mg total) by mouth at bedtime as needed. for sleep, Disp: 90 tablet, Rfl: 0  Allergies  Allergen Reactions  Lisinopril Swelling    I personally reviewed active problem list, medication list, allergies, family history, social history, health maintenance with the patient/caregiver today.   ROS  Ten systems reviewed and is negative except as mentioned in HPI    Objective  Virtual encounter, vitals not obtained.  There is no height or weight on file to calculate BMI.  Physical Exam  Awake, alert and oriented   PHQ2/9:    06/19/2022   10:20 AM 05/30/2022    9:39 AM 04/30/2022   10:27 AM 01/27/2022   10:24 AM 12/17/2021   12:59 PM  Depression screen PHQ 2/9  Decreased Interest 0 0 0 0 0  Down, Depressed, Hopeless 0 0 0 0 0  PHQ - 2 Score 0 0 0 0 0  Altered sleeping 0 0  0 0  Tired, decreased energy 0 0  0 0  Change in appetite 0 0  0 0  Feeling bad or failure about yourself  0 0  0 0  Trouble concentrating 0 0  0 0  Moving slowly or fidgety/restless 0 0  0 0  Suicidal thoughts 0 0  0 0  PHQ-9 Score 0 0  0 0  Difficult doing work/chores     Not difficult at all   PHQ-2/9 Result is negative.    Fall Risk:    06/19/2022   10:20 AM 05/30/2022    9:39 AM 04/30/2022   10:27 AM 01/27/2022   10:24 AM 12/17/2021   12:58 PM  Fall Risk   Falls in the past year? 0 0 0 0 0  Number falls in past yr:  0 0  0  Injury with Fall?  0 0  0  Risk for fall due to : No Fall Risks Impaired balance/gait;Impaired mobility Impaired mobility;Impaired balance/gait Impaired balance/gait;Impaired mobility   Follow up Falls evaluation completed Falls prevention discussed Falls prevention discussed Falls prevention discussed;Falls evaluation completed;Education provided      Assessment & Plan  1. Pressure injury of buttock, stage 1, unspecified laterality  Advised to keep barrier cream, use wipes instead of toilet  paper   Come in if no resolution  I discussed the assessment and treatment plan with the patient. The patient was provided an opportunity to ask questions and all were answered. The patient agreed with the plan and demonstrated an understanding of the instructions.  The patient was advised to call back or seek an in-person evaluation if the symptoms worsen or if the condition fails to improve as anticipated.  I provided 15  minutes of non-face-to-face time during this encounter.

## 2022-06-24 NOTE — Progress Notes (Deleted)
   Acute Office Visit  Subjective:     Patient ID: LYN JOENS, female    DOB: 1946-04-03, 76 y.o.   MRN: [id number]  No chief complaint on file.   HPI Patient is in today for sore on bottom.    Duration:  {Blank single:19197::"chronic","days","weeks","months"}  Location: {Blank multiple:19196::"generalized","trunk","face","hands","arms","legs","groin"}  Itching: {Blank single:19197::"yes","no"} Burning: {Blank single:19197::"yes","no"} Redness: {Blank single:19197::"yes","no"} Oozing: {Blank single:19197::"yes","no"} Scaling: {Blank single:19197::"yes","no"} Blisters: {Blank single:19197::"yes","no"} Painful: {Blank single:19197::"yes","no"} Fevers: {Blank single:19197::"yes","no"} Change in detergents/soaps/personal care products: {Blank single:19197::"yes","no"} Recent illness: {Blank single:19197::"yes","no"} Recent travel:{Blank single:19197::"yes","no"} History of same: {Blank single:19197::"yes","no"} Context: {Blank multiple:19196::"better","worse","stable","fluctuating","contacts with the same"} Alleviating factors: {Blank multiple:19196::"hydrocortisone cream","benadryl","lotion/moisturizer","nothing"} Treatments attempted:{Blank multiple:19196::"hydrocortisone cream","benadryl","OTC anit-fungal","lotion/moisturizer","nothing"} Shortness of breath: {Blank single:19197::"yes","no"}  Throat/tongue swelling: {Blank single:19197::"yes","no"} Myalgias/arthralgias: {Blank single:19197::"yes","no"}   ROS      Objective:    There were no vitals taken for this visit. {Vitals History (Optional):23777}  Physical Exam  No results found for any visits on 06/24/22.      Assessment & Plan:   Problem List Items Addressed This Visit   None   No orders of the defined types were placed in this encounter.   No follow-ups on file.  Teodora Medici, DO

## 2022-06-25 ENCOUNTER — Encounter: Payer: Self-pay | Admitting: Family Medicine

## 2022-06-25 ENCOUNTER — Telehealth (INDEPENDENT_AMBULATORY_CARE_PROVIDER_SITE_OTHER): Payer: Medicare Other | Admitting: Family Medicine

## 2022-06-25 VITALS — Wt 322.0 lb

## 2022-06-25 DIAGNOSIS — L89301 Pressure ulcer of unspecified buttock, stage 1: Secondary | ICD-10-CM | POA: Diagnosis not present

## 2022-06-29 ENCOUNTER — Other Ambulatory Visit: Payer: Self-pay | Admitting: Urology

## 2022-06-29 DIAGNOSIS — R351 Nocturia: Secondary | ICD-10-CM

## 2022-06-29 DIAGNOSIS — N3946 Mixed incontinence: Secondary | ICD-10-CM

## 2022-07-04 ENCOUNTER — Ambulatory Visit: Payer: Medicare Other | Admitting: Urology

## 2022-07-07 ENCOUNTER — Ambulatory Visit: Payer: Medicare Other | Admitting: Oncology

## 2022-07-07 ENCOUNTER — Ambulatory Visit (LOCAL_COMMUNITY_HEALTH_CENTER): Payer: Medicare Other

## 2022-07-07 ENCOUNTER — Other Ambulatory Visit: Payer: Medicare Other

## 2022-07-07 DIAGNOSIS — Z23 Encounter for immunization: Secondary | ICD-10-CM | POA: Diagnosis not present

## 2022-07-08 NOTE — Progress Notes (Signed)
07/09/2022 1:47 PM   Becky Trevino 1946/03/31 [id number]  Referring provider: [name], MD [address] [place],  [place] [address]  Urological history: 1. Mixed incontinence -contributing factors of age, obesity, cystocele, diuretics, DM and DDD -PVR *** mL -Myrbetriq 50 mg daily  2. Sleep apnea -Noncompliant with CPAP machine ***  No chief complaint on file.     HPI: Becky Trevino is a 76 y.o. female who presents today for 12 month follow up.   Since she was last seen with [place] in October 2022, she was seen in the emergency department in November 2022 for abdominal pain.  A CT scan was conducted without contrast and noted a right renal lesion that was suboptimally characterized.  PVR ***  PMH: Past Medical History:  Diagnosis Date   Cervical spondylosis    Chronic kidney disease    Stage 3   DDD (degenerative disc disease), cervical    [facility]   Diabetes mellitus without complication (HCC)    Diverticulosis    Hyperlipidemia    Hypertension    Intertrigo    Leg cramps    Leg varices    Obesity    OSA (obstructive sleep apnea)    Symptomatic menopausal or female climacteric states    Syncope     Surgical History: Past Surgical History:  Procedure Laterality Date   ABDOMINAL HYSTERECTOMY  1993   Total   BUNIONECTOMY Bilateral 1993   I & D KNEE WITH POLY EXCHANGE Right 11/19/2017   Procedure: RIGHT KNEE POLY EXCHANGE WITH IRRIGATION AND DEBRIDEMENT;  Surgeon: Hessie Knows, MD;  Location: [facility];  Service: Orthopedics;  Laterality: Right;   IR FLUORO GUIDED NEEDLE PLC ASPIRATION/INJECTION LOC  09/19/2021   JOINT REPLACEMENT     bilateral knee   LAMINECTOMY  11/14/2013    Cervical Fusion , [facility], Dr. Delilah Shan   LASER ABLATION Bilateral 07/29/2012   Dr. Lucky Cowboy   LOWER EXTREMITY ANGIOGRAPHY Right 11/02/2019   Procedure: Lower Extremity Angiography;  Surgeon: Katha Cabal, MD;  Location: [facility];  Service:  Cardiovascular;  Laterality: Right;   ORIF ANKLE FRACTURE Right 09/02/2019   Procedure: OPEN REDUCTION INTERNAL FIXATION (ORIF) ANKLE FRACTURE BIMALLEOLAR;  Surgeon: [name], DPM;  Location: [facility];  Service: Podiatry;  Laterality: Right;   PERIPHERAL VASCULAR THROMBECTOMY Right 03/02/2020   Procedure: PERIPHERAL VASCULAR THROMBECTOMY;  Surgeon: Algernon Huxley, MD;  Location: [place] [facility];  Service: Cardiovascular;  Laterality: Right;   REPLACEMENT TOTAL KNEE Left 03/2010   Dr. Rudene Christians   TEE WITHOUT CARDIOVERSION N/A 09/19/2021   Procedure: TRANSESOPHAGEAL ECHOCARDIOGRAM (TEE);  Surgeon: Corey Skains, MD;  Location: [facility];  Service: Cardiovascular;  Laterality: N/A;   TOTAL HIP ARTHROPLASTY Left 12/11/2015   Procedure: TOTAL HIP ARTHROPLASTY ANTERIOR APPROACH;  Surgeon: Hessie Knows, MD;  Location: [facility];  Service: Orthopedics;  Laterality: Left;   TOTAL KNEE ARTHROPLASTY Right 03/11/2016   Procedure: TOTAL KNEE ARTHROPLASTY;  Surgeon: Hessie Knows, MD;  Location: [facility];  Service: Orthopedics;  Laterality: Right;    Home Medications:  Allergies as of 07/09/2022       Reactions   Lisinopril Swelling        Medication List        Accurate as of July 08, 2022  1:47 PM. If you have any questions, ask your nurse or doctor.          Accu-Chek Guide test strip Generic drug: glucose blood  TEST ONCE A DAY AS DIRECTED   acetaminophen 650 MG CR tablet Commonly known as: TYLENOL Take 650-1,300 mg by mouth 2 (two) times daily as needed for pain.   amiodarone 200 MG tablet Commonly known as: PACERONE Take 200 mg by mouth daily.   amoxicillin 500 MG capsule Commonly known as: AMOXIL Take 1 capsule (500 mg total) by mouth 2 (two) times daily.   apixaban 5 MG Tabs tablet Commonly known as: ELIQUIS Take 1 tablet by mouth 2 (two) times daily.   atorvastatin 40 MG tablet Commonly known as: LIPITOR Take 1 tablet (40 mg total) by mouth every evening.   blood  glucose meter kit and supplies 1 each by Other route daily at 12 noon. Dispense based on patient and insurance preference. Check fsbs once a day   Calcium Carbonate-Vitamin D 600-400 MG-UNIT tablet Take 1 tablet by mouth 2 (two) times daily.   dapagliflozin propanediol 10 MG Tabs tablet Commonly known as: Farxiga Take 1 tablet (10 mg total) by mouth daily before breakfast. Patient receives via [employer] Patient Assistance   escitalopram 5 MG tablet Commonly known as: LEXAPRO Take 1 tablet (5 mg total) by mouth daily.   fluticasone 50 MCG/ACT nasal spray Commonly known as: FLONASE SPRAY 2 SPRAYS INTO EACH NOSTRIL EVERY DAY   hydroquinone 4 % cream Apply topically at bedtime.   latanoprost 0.005 % ophthalmic solution Commonly known as: XALATAN SMARTSIG:In Eye(s)   levothyroxine 50 MCG tablet Commonly known as: SYNTHROID Take 1 tablet (50 mcg total) by mouth daily.   Magnesium Oxide -Mg Supplement 500 MG Caps Take 500 mg by mouth daily.   metoprolol succinate 50 MG 24 hr tablet Commonly known as: TOPROL-XL Take 50 mg by mouth daily.   Myrbetriq 50 MG Tb24 tablet Generic drug: mirabegron ER TAKE 1 TABLET BY MOUTH EVERY DAY   Oxycodone HCl 10 MG Tabs Take 1 tablet by mouth 4 (four) times daily as needed.   polyethylene glycol 17 g packet Commonly known as: MIRALAX / GLYCOLAX Take 17 g by mouth daily. What changed: when to take this   potassium chloride 10 MEQ tablet Commonly known as: KLOR-CON Take 10 mEq by mouth daily.   Simbrinza 1-0.2 % Susp Generic drug: Brinzolamide-Brimonidine Place 1 drop into both eyes 2 (two) times daily.   sodium chloride 0.65 % Soln nasal spray Commonly known as: OCEAN Place 1 spray into both nostrils 3 (three) times daily.   torsemide 20 MG tablet Commonly known as: DEMADEX TAKE 1 TABLET BY MOUTH EVERY DAY   traZODone 50 MG tablet Commonly known as: DESYREL Take 0.5-1 tablets (25-50 mg total) by mouth at bedtime as needed. for  sleep        Allergies:  Allergies  Allergen Reactions   Lisinopril Swelling    Family History: Family History  Problem Relation Age of Onset   Diabetes Mother    Hyperlipidemia Mother    Hypertension Mother    Diabetes Father    Hyperlipidemia Father    Hypertension Father    Obesity Father    Hypertension Sister    Hyperlipidemia Sister    Hyperlipidemia Sister    Breast cancer Neg Hx     Social History:  reports that she has never smoked. She has never used smokeless tobacco. She reports that she does not drink alcohol and does not use drugs.  ROS: For pertinent review of systems please refer to history of present illness  Physical Exam: There were no vitals taken  for this visit.  Constitutional:  Well nourished. Alert and oriented, No acute distress. HEENT: Green AT, moist mucus membranes.  Trachea midline, no masses. Cardiovascular: No clubbing, cyanosis, or edema. Respiratory: Normal respiratory effort, no increased work of breathing. GU: No CVA tenderness.  No bladder fullness or masses. Vulvovaginal atrophy w/ pallor, loss of rugae, introital retraction, excoriations.  Vulvar thinning, fusion of labia, clitoral hood retraction, prominent urethral meatus.   *** external genitalia, *** pubic hair distribution, no lesions.  Normal urethral meatus, no lesions, no prolapse, no discharge.   No urethral masses, tenderness and/or tenderness. No bladder fullness, tenderness or masses. *** vagina mucosa, *** estrogen effect, no discharge, no lesions, *** pelvic support, *** cystocele and *** rectocele noted.  No cervical motion tenderness.  Uterus is freely mobile and non-fixed.  No adnexal/parametria masses or tenderness noted.  Anus and perineum are without rashes or lesions.   ***  Neurologic: Grossly intact, no focal deficits, moving all 4 extremities. Psychiatric: Normal mood and affect.    Laboratory Data:    Latest Ref Rng & Units 05/30/2022   11:17 AM 01/27/2022    12:16 PM 12/03/2021    3:41 PM  BMP  Glucose 65 - 99 mg/dL 91  77  86   BUN 7 - 25 mg/dL 18  24  21    Creatinine 0.60 - 1.00 mg/dL 1.33  1.47  1.40   BUN/Creat Ratio 6 - 22 (calc) 14  16  15    Sodium 135 - 146 mmol/L 138  139  141   Potassium 3.5 - 5.3 mmol/L 4.2  4.6  4.7   Chloride 98 - 110 mmol/L 102  103  105   CO2 20 - 32 mmol/L 30  28  26    Calcium 8.6 - 10.4 mg/dL 8.8  8.9  9.1     Component     Latest Ref Rng 04/30/2022  Hemoglobin A1C     4.0 - 5.6 % 5.2   HbA1c, POC (controlled diabetic range)     0.0 - 7.0 %    Urinalysis See Epic and HPI I have reviewed the labs.  Pertinent Imaging  CLINICAL DATA:  Abdominal pain and fever.   EXAM: CT ABDOMEN AND PELVIS WITHOUT CONTRAST   TECHNIQUE: Multidetector CT imaging of the abdomen and pelvis was performed following the standard protocol without IV contrast.   COMPARISON:  Pelvic CT dated 01/09/2020 and CT abdomen pelvis dated 11/01/2019.   FINDINGS: Evaluation of this exam is limited in the absence of intravenous contrast.   Lower chest: Bibasilar atelectasis/scarring. Coronary vascular calcification.   No intra-abdominal free air or free fluid.   Hepatobiliary: The liver is unremarkable. No intrahepatic biliary dilatation. Layering high attenuating content within the gallbladder may represent sludge or small stones. No pericholecystic fluid or evidence of acute cholecystitis by CT.   Pancreas: Unremarkable. No pancreatic ductal dilatation or surrounding inflammatory changes.   Spleen: Normal in size without focal abnormality.   Adrenals/Urinary Tract: The adrenal glands are unremarkable. There is no hydronephrosis or nephrolithiasis on either side. A 1 cm right renal upper pole hypodense lesion is suboptimally characterized. The visualized ureters appear unremarkable. The urinary bladder is poorly visualized and obscured by streak artifact caused by left hip arthroplasty.   Stomach/Bowel: Moderate  stool throughout the colon. There is a 2 cm duodenal diverticulum without active inflammatory changes. There is no bowel obstruction or active inflammation. The appendix is normal.   Vascular/Lymphatic: Moderate aortoiliac atherosclerotic disease. An infrarenal  IVC filter noted. There is no adenopathy.   Reproductive: The uterus is not visualized.   Other: None   Musculoskeletal: Degenerative changes of the spine and scoliosis. Severe right hip arthritic changes with protrusio acetabula and fragmentation of the acetabulum and medial acetabular wall. Total left hip arthroplasty. No acute osseous pathology. Right hip effusion.   IMPRESSION: 1. No acute intra-abdominal or pelvic pathology. No hydronephrosis or nephrolithiasis. 2. Moderate colonic stool burden. No bowel obstruction. Normal appendix. 3. Severe right hip arthritic changes with protrusion acetabula and fragmentation of the acetabulum and medial acetabular wall. Right hip effusion. 4. Aortic Atherosclerosis (ICD10-I70.0).     Electronically Signed   By: Anner Crete M.D.   On: 07/16/2021 00:58  I have independently reviewed the films.  See HPI.    Assessment & Plan:    1. Renal lesion -A 1 cm right renal upper pole hypodense lesion is suboptimally characterized -Explained that it was suboptimally characterized on her CT scan in November, but the radiologist made no further comment regarding it -Our differential at this time is either a benign or malignant cyst -At this time, we choose a repeat noncontrast CT scan to see if it is increased in size or pursue a contrast study to see if we can get further characterization of the lesion or not pursue further work-up due to her other co-morbidities -***  2. Mixed incontinence -At goal on Myrbetriq 50 mg daily -She has successfully weaned off Tofranil -UA is benign today -PVR is now  3. Nocturia -Working with ENT to acquire more comfortable CPAP machine  No  follow-ups on file.  These notes generated with voice recognition software. I apologize for typographical errors.  [place], [place] [address] [place], [place] [address] [phone]

## 2022-07-09 ENCOUNTER — Ambulatory Visit (INDEPENDENT_AMBULATORY_CARE_PROVIDER_SITE_OTHER): Payer: Medicare Other | Admitting: Urology

## 2022-07-09 ENCOUNTER — Encounter: Payer: Self-pay | Admitting: Urology

## 2022-07-09 VITALS — BP 107/62 | HR 61 | Ht 71.0 in | Wt 322.0 lb

## 2022-07-09 DIAGNOSIS — N289 Disorder of kidney and ureter, unspecified: Secondary | ICD-10-CM | POA: Diagnosis not present

## 2022-07-09 DIAGNOSIS — R351 Nocturia: Secondary | ICD-10-CM | POA: Diagnosis not present

## 2022-07-09 DIAGNOSIS — N3946 Mixed incontinence: Secondary | ICD-10-CM

## 2022-07-09 LAB — BLADDER SCAN AMB NON-IMAGING

## 2022-07-09 MED ORDER — GEMTESA 75 MG PO TABS: 75.0000 mg | ORAL_TABLET | Freq: Every day | ORAL | 3 refills | Status: DC

## 2022-07-10 ENCOUNTER — Telehealth: Payer: Self-pay | Admitting: Urology

## 2022-07-10 NOTE — Telephone Encounter (Signed)
Patient called and stated her medication was changed to [facility] at appt yesterday. She has a 3 month supply of Myrbetriq, and is asking if she should finish that before starting Gemtesa?

## 2022-07-10 NOTE — Telephone Encounter (Signed)
Per Larene Beach, pt may finish Myrbetriq then start Gemtesa samples and prescription as it was more of a cost issue for pt. Pt notified and expressed understanding.

## 2022-07-14 ENCOUNTER — Encounter (INDEPENDENT_AMBULATORY_CARE_PROVIDER_SITE_OTHER): Payer: Self-pay

## 2022-07-16 ENCOUNTER — Telehealth: Payer: Self-pay

## 2022-07-16 NOTE — Progress Notes (Signed)
    Chronic Care Management Pharmacy Assistant   Name: Becky Trevino  MRN: [id number] DOB: May 24, 1946  Reason for Encounter: Medication Management   I spoke with the patient and per patient she hasn't started the Gemtesa at this time as she still had 90-Days worth of Myrbetriq left.  I informed the patient that per Becky Trevino, [name] there is no patient assistance for [facility]. I informed her that per [name] the only other option that she can try that is an affordable option is Oxybutynin XL. I informed her that Becky Trevino could speak with her regarding side effects of this medication or she can speak with her Urologist about this as well.  Patient stated that she will speak with her Urologist about this medication. Patient advised she can contact me if she has any additional questions.  Medications: Outpatient Encounter Medications as of 07/16/2022  Medication Sig   ACCU-CHEK GUIDE test strip TEST ONCE A DAY AS DIRECTED   acetaminophen (TYLENOL) 650 MG CR tablet Take 650-1,300 mg by mouth 2 (two) times daily as needed for pain.   amiodarone (PACERONE) 200 MG tablet Take 200 mg by mouth daily.   amoxicillin (AMOXIL) 500 MG capsule Take 1 capsule (500 mg total) by mouth 2 (two) times daily.   apixaban (ELIQUIS) 5 MG TABS tablet Take 1 tablet by mouth 2 (two) times daily.   atorvastatin (LIPITOR) 40 MG tablet Take 1 tablet (40 mg total) by mouth every evening.   blood glucose meter kit and supplies 1 each by Other route daily at 12 noon. Dispense based on patient and insurance preference. Check fsbs once a day   Calcium Carbonate-Vitamin D 600-400 MG-UNIT tablet Take 1 tablet by mouth 2 (two) times daily.   dapagliflozin propanediol (FARXIGA) 10 MG TABS tablet Take 1 tablet (10 mg total) by mouth daily before breakfast. Patient receives via [employer] Patient Assistance   escitalopram (LEXAPRO) 5 MG tablet Take 1 tablet (5 mg total) by mouth daily.   fluticasone (FLONASE) 50 MCG/ACT nasal spray SPRAY 2  SPRAYS INTO EACH NOSTRIL EVERY DAY   hydroquinone 4 % cream Apply topically at bedtime.   latanoprost (XALATAN) 0.005 % ophthalmic solution SMARTSIG:In Eye(s)   levothyroxine (SYNTHROID) 50 MCG tablet Take 1 tablet (50 mcg total) by mouth daily.   Magnesium Oxide 500 MG CAPS Take 500 mg by mouth daily.    metoprolol succinate (TOPROL-XL) 50 MG 24 hr tablet Take 50 mg by mouth daily.   Oxycodone HCl 10 MG TABS Take 1 tablet by mouth 4 (four) times daily as needed.   polyethylene glycol (MIRALAX / GLYCOLAX) 17 g packet Take 17 g by mouth daily. (Patient taking differently: Take 17 g by mouth every other day.)   potassium chloride (KLOR-CON) 10 MEQ tablet Take 10 mEq by mouth daily.   SIMBRINZA 1-0.2 % SUSP Place 1 drop into both eyes 2 (two) times daily.   sodium chloride (OCEAN) 0.65 % SOLN nasal spray Place 1 spray into both nostrils 3 (three) times daily.   torsemide (DEMADEX) 20 MG tablet TAKE 1 TABLET BY MOUTH EVERY DAY   traZODone (DESYREL) 50 MG tablet Take 0.5-1 tablets (25-50 mg total) by mouth at bedtime as needed. for sleep   Vibegron (GEMTESA) 75 MG TABS Take 75 mg by mouth daily.   No facility-administered encounter medications on file as of 07/16/2022.    Becky Trevino, CPA/CMA Clinical Pharmacist Assistant Phone: [phone]

## 2022-07-19 NOTE — Progress Notes (Signed)
  Are you feeling sick today? No   Have you ever received a dose of COVID-19 Vaccine? [employer], [place], [place], [place], Other) Yes  If yes, which vaccine and how many doses?      Did you bring the vaccination record card or other documentation?  Yes   Do you have a health condition or are undergoing treatment that makes you moderately or severely immunocompromised? This would include, but not be limited to: cancer, HIV, organ transplant, immunosuppressive therapy/high-dose corticosteroids, or moderate/severe primary immunodeficiency.  No  Have you received COVID-19 vaccine before or during hematopoietic cell transplant (HCT) or CAR-T-cell therapies? No  Have you ever had an allergic reaction to: (This would include a severe allergic reaction or a reaction that caused hives, swelling, or respiratory distress, including wheezing.) A component of a COVID-19 vaccine or a previous dose of COVID-19 vaccine? No   Have you ever had an allergic reaction to another vaccine (other thanCOVID-19 vaccine) or an injectable medication? (This would include a severe allergic reaction or a reaction that caused hives, swelling, or respiratory distress, including wheezing.)   No    Do you have a history of any of the following:  Myocarditis or Pericarditis No  Dermal fillers:  No  Multisystem Inflammatory Syndrome (MIS-C or MIS-A)? No  COVID-19 disease within the past 3 months? No  Vaccinated with monkeypox vaccine in the last 4 weeks? No

## 2022-07-21 ENCOUNTER — Telehealth: Payer: Self-pay | Admitting: Urology

## 2022-07-21 NOTE — Telephone Encounter (Signed)
Patient called and would like to know if Oxybutynin xl could be prescribed instead of Gemtesa; due to cost of medication. She has been working with someone who is helping with cost of medications.

## 2022-07-22 NOTE — Telephone Encounter (Signed)
Pt states the specific PAP she was going through is no longer offered. Pt states she was going to call back and double check and she will call the office back to let us know for sure.

## 2022-07-23 ENCOUNTER — Ambulatory Visit
Admission: RE | Admit: 2022-07-23 | Discharge: 2022-07-23 | Disposition: A | Discharge status: Home / Self Care | Payer: Medicare Other | Source: Ambulatory Visit | Attending: Urology | Admitting: Urology

## 2022-07-23 DIAGNOSIS — N289 Disorder of kidney and ureter, unspecified: Secondary | ICD-10-CM | POA: Insufficient documentation

## 2022-07-23 DIAGNOSIS — N281 Cyst of kidney, acquired: Secondary | ICD-10-CM | POA: Diagnosis not present

## 2022-07-27 ENCOUNTER — Other Ambulatory Visit: Payer: Self-pay | Admitting: Family Medicine

## 2022-07-28 ENCOUNTER — Telehealth: Payer: Self-pay

## 2022-07-28 NOTE — Telephone Encounter (Signed)
-----   Message from Nori Riis, PA-C sent at 07/27/2022  4:48 PM EST ----- Please let Becky Trevino know that her RUS shows that the cyst in her kidney is benign and needs no further follow up.

## 2022-07-28 NOTE — Telephone Encounter (Signed)
Notified pt as advised, pt expressed understanding.  ?

## 2022-07-31 ENCOUNTER — Telehealth: Payer: Self-pay | Admitting: Family Medicine

## 2022-07-31 DIAGNOSIS — R600 Localized edema: Secondary | ICD-10-CM

## 2022-07-31 NOTE — Telephone Encounter (Signed)
Refilled 03/10/2022 #[id number] 1 rf. Requested Prescriptions  Pending Prescriptions Disp Refills   torsemide (DEMADEX) 20 MG tablet [Pharmacy Med Name: TORSEMIDE 20 MG TABLET] 90 tablet 1    Sig: TAKE 1 TABLET BY MOUTH EVERY DAY     Cardiovascular:  Diuretics - Loop Failed - 07/31/2022  5:26 PM      Failed - Cr in normal range and within 180 days    Creat  Date Value Ref Range Status  05/30/2022 1.33 (H) 0.60 - 1.00 mg/dL Final   Creatinine, Urine  Date Value Ref Range Status  02/05/2022 107 20 - 275 mg/dL Final         Failed - Mg Level in normal range and within 180 days    Magnesium  Date Value Ref Range Status  09/15/2021 2.1 1.7 - 2.4 mg/dL Final    Comment:    Performed at [facility], [address] [place], [place] [address]         [place] in normal range and within 180 days    Potassium  Date Value Ref Range Status  05/30/2022 4.2 3.5 - 5.3 mmol/L Final  10/18/2012 3.5 3.5 - 5.1 mmol/L Final         Passed - Ca in normal range and within 180 days    Calcium  Date Value Ref Range Status  05/30/2022 8.8 8.6 - 10.4 mg/dL Final   Calcium, Total  Date Value Ref Range Status  10/18/2012 7.9 (L) 8.5 - 10.1 mg/dL Final   Calcium, Ion  Date Value Ref Range Status  09/02/2019 1.17 1.15 - 1.40 mmol/L Final         Passed - Na in normal range and within 180 days    Sodium  Date Value Ref Range Status  05/30/2022 138 135 - 146 mmol/L Final  11/26/2015 142 134 - 144 mmol/L Final  10/18/2012 141 136 - 145 mmol/L Final         Passed - Cl in normal range and within 180 days    Chloride  Date Value Ref Range Status  05/30/2022 102 98 - 110 mmol/L Final  10/18/2012 110 (H) 98 - 107 mmol/L Final         Passed - Last BP in normal range    BP Readings from Last 1 Encounters:  07/09/22 107/62         Passed - Valid encounter within last 6 months    Recent Outpatient Visits           1 month ago Pressure injury of buttock, stage 1, unspecified  laterality   [facility] [place], Drue Stager, MD   2 months ago AF (paroxysmal atrial fibrillation) [facility])   [facility] [name], MD   3 months ago Type 2 diabetes mellitus with diabetic neuropathy, without long-term current use of insulin [facility])   [facility] [place], Drue Stager, MD   6 months ago Type 2 diabetes mellitus with diabetic neuropathy, without long-term current use of insulin [facility])   [facility] [name], MD   7 months ago Abnormal lung sounds   [facility] [name], MD       Future Appointments             In 5 days  [facility], [place]   In 4 weeks [name], MD [facility], [place]   In 10 months [name], MD [facility]   In 11 months [name], [facility]

## 2022-08-04 NOTE — Progress Notes (Unsigned)
I connected with  Becky Trevino on 08/05/2022 by a audio enabled telemedicine application and verified that I am speaking with the correct person using two identifiers.  Patient Location: Home  Provider Location: Office/Clinic  I discussed the limitations of evaluation and management by telemedicine. The patient expressed understanding and agreed to proceed.  Subjective:   Becky Trevino is a 76 y.o. female who presents for Medicare Annual (Subsequent) preventive examination.  Review of Systems    Per HPI unless specifically indicated below.  Cardiac Risk Factors include: advanced age (>21mn, >>70women);female gender, essential hypertension, Type 2 diabetes, and hyperlipidemia.           Objective:    Today's Vitals   08/05/22 1028  PainSc: 6    There is no height or weight on file to calculate BMI.     08/05/2022   10:36 AM 09/14/2021    5:25 PM 08/01/2021   10:13 AM 05/24/2021    7:32 AM 02/23/2021   10:44 AM 09/03/2020    2:00 PM 07/31/2020    3:10 PM  Advanced Directives  Does Patient Have a Medical Advance Directive? Yes No No No No Yes Yes  Type of [place]     Living will [place] will  Does patient want to make changes to medical advance directive? No - Patient declined     No - Patient declined   Copy of HFairplainsin Chart? No - copy requested      No - copy requested  Would patient like information on creating a medical advance directive?  No - Patient declined No - Patient declined  No - Patient declined      Current Medications (verified) Outpatient Encounter Medications as of 08/05/2022  Medication Sig   ACCU-CHEK GUIDE test strip TEST ONCE A DAY AS DIRECTED   acetaminophen (TYLENOL) 650 MG CR tablet Take 650-1,300 mg by mouth 2 (two) times daily as needed for pain.   amiodarone (PACERONE) 200 MG tablet Take 200 mg by mouth daily.   amoxicillin (AMOXIL)  500 MG capsule Take 1 capsule (500 mg total) by mouth 2 (two) times daily.   apixaban (ELIQUIS) 5 MG TABS tablet Take 1 tablet by mouth 2 (two) times daily.   atorvastatin (LIPITOR) 40 MG tablet Take 1 tablet (40 mg total) by mouth every evening.   blood glucose meter kit and supplies 1 each by Other route daily at 12 noon. Dispense based on patient and insurance preference. Check fsbs once a day   Calcium Carbonate-Vitamin D 600-400 MG-UNIT tablet Take 1 tablet by mouth 2 (two) times daily.   dapagliflozin propanediol (FARXIGA) 10 MG TABS tablet Take 1 tablet (10 mg total) by mouth daily before breakfast. Patient receives via [employer] Patient Assistance   escitalopram (LEXAPRO) 5 MG tablet Take 1 tablet (5 mg total) by mouth daily.   fluticasone (FLONASE) 50 MCG/ACT nasal spray SPRAY 2 SPRAYS INTO EACH NOSTRIL EVERY DAY   hydroquinone 4 % cream Apply topically at bedtime.   latanoprost (XALATAN) 0.005 % ophthalmic solution SMARTSIG:In Eye(s)   levothyroxine (SYNTHROID) 50 MCG tablet TAKE 1 TABLET BY MOUTH EVERY DAY   Magnesium Oxide 500 MG CAPS Take 500 mg by mouth daily.    metoprolol succinate (TOPROL-XL) 50 MG 24 hr tablet Take 50 mg by mouth daily.   Oxycodone HCl 10 MG TABS Take 1 tablet by mouth 4 (four) times daily as  needed.   polyethylene glycol (MIRALAX / GLYCOLAX) 17 g packet Take 17 g by mouth daily. (Patient taking differently: Take 17 g by mouth every other day.)   potassium chloride (KLOR-CON) 10 MEQ tablet Take 10 mEq by mouth daily.   SIMBRINZA 1-0.2 % SUSP Place 1 drop into both eyes 2 (two) times daily.   sodium chloride (OCEAN) 0.65 % SOLN nasal spray Place 1 spray into both nostrils 3 (three) times daily.   torsemide (DEMADEX) 20 MG tablet TAKE 1 TABLET BY MOUTH EVERY DAY   traZODone (DESYREL) 50 MG tablet Take 0.5-1 tablets (25-50 mg total) by mouth at bedtime as needed. for sleep   Vibegron (GEMTESA) 75 MG TABS Take 75 mg by mouth daily. (Patient not taking: Reported on  08/05/2022)   No facility-administered encounter medications on file as of 08/05/2022.    Allergies (verified) Lisinopril   History: Past Medical History:  Diagnosis Date   Cervical spondylosis    Chronic kidney disease    Stage 3   DDD (degenerative disc disease), cervical    [facility]   Diabetes mellitus without complication (HCC)    Diverticulosis    Hyperlipidemia    Hypertension    Intertrigo    Leg cramps    Leg varices    Obesity    OSA (obstructive sleep apnea)    Symptomatic menopausal or female climacteric states    Syncope    Past Surgical History:  Procedure Laterality Date   ABDOMINAL HYSTERECTOMY  1993   Total   BUNIONECTOMY Bilateral 1993   I & D KNEE WITH POLY EXCHANGE Right 11/19/2017   Procedure: RIGHT KNEE POLY EXCHANGE WITH IRRIGATION AND DEBRIDEMENT;  Surgeon: Hessie Knows, MD;  Location: [facility];  Service: Orthopedics;  Laterality: Right;   IR FLUORO GUIDED NEEDLE PLC ASPIRATION/INJECTION LOC  09/19/2021   JOINT REPLACEMENT     bilateral knee   LAMINECTOMY  11/14/2013    Cervical Fusion , [facility], Dr. Delilah Shan   LASER ABLATION Bilateral 07/29/2012   Dr. Lucky Cowboy   LOWER EXTREMITY ANGIOGRAPHY Right 11/02/2019   Procedure: Lower Extremity Angiography;  Surgeon: Katha Cabal, MD;  Location: [place] [facility];  Service: Cardiovascular;  Laterality: Right;   ORIF ANKLE FRACTURE Right 09/02/2019   Procedure: OPEN REDUCTION INTERNAL FIXATION (ORIF) ANKLE FRACTURE BIMALLEOLAR;  Surgeon: [name], DPM;  Location: [facility];  Service: Podiatry;  Laterality: Right;   PERIPHERAL VASCULAR THROMBECTOMY Right 03/02/2020   Procedure: PERIPHERAL VASCULAR THROMBECTOMY;  Surgeon: Algernon Huxley, MD;  Location: [place] [facility];  Service: Cardiovascular;  Laterality: Right;   REPLACEMENT TOTAL KNEE Left 03/2010   Dr. Rudene Christians   TEE WITHOUT CARDIOVERSION N/A 09/19/2021   Procedure: TRANSESOPHAGEAL ECHOCARDIOGRAM (TEE);  Surgeon: Corey Skains, MD;  Location:  [facility];  Service: Cardiovascular;  Laterality: N/A;   TOTAL HIP ARTHROPLASTY Left 12/11/2015   Procedure: TOTAL HIP ARTHROPLASTY ANTERIOR APPROACH;  Surgeon: Hessie Knows, MD;  Location: [facility];  Service: Orthopedics;  Laterality: Left;   TOTAL KNEE ARTHROPLASTY Right 03/11/2016   Procedure: TOTAL KNEE ARTHROPLASTY;  Surgeon: Hessie Knows, MD;  Location: [facility];  Service: Orthopedics;  Laterality: Right;   Family History  Problem Relation Age of Onset   Diabetes Mother    Hyperlipidemia Mother    Hypertension Mother    Diabetes Father    Hyperlipidemia Father    Hypertension Father    Obesity Father    Hypertension Sister    Hyperlipidemia Sister  Hyperlipidemia Sister    Breast cancer Neg Hx    Social History   Socioeconomic History   Marital status: Married    Spouse name: Walida Cajas   Number of children: 1   Years of education: Not on file   Highest education level: Not on file  Occupational History   Occupation: Retired  Tobacco Use   Smoking status: Never   Smokeless tobacco: Never  Vaping Use   Vaping Use: Never used  Substance and Sexual Activity   Alcohol use: No    Alcohol/week: 0.0 standard drinks of alcohol   Drug use: No   Sexual activity: Yes    Partners: Male  Other Topics Concern   Not on file  Social History Narrative   Married, one adopted grown child    Social Determinants of Health   Financial Resource Strain: High Risk (08/05/2022)   Overall Financial Resource Strain (CARDIA)    Difficulty of Paying Living Expenses: Very hard  Food Insecurity: No Food Insecurity (05/28/2022)   Hunger Vital Sign    Worried About Running Out of Food in the Last Year: Never true    [place] in the Last Year: Never true  Transportation Needs: No Transportation Needs (08/05/2022)   PRAPARE - Hydrologist (Medical): No    Lack of Transportation (Non-Medical): No  Physical Activity: Insufficiently Active (08/05/2022)    Exercise Vital Sign    Days of Exercise per Week: 5 days    Minutes of Exercise per Session: 20 min  Stress: No Stress Concern Present (08/05/2022)   [place]    Feeling of Stress : Not at all  Social Connections: [place] (08/05/2022)   Social Connection and Isolation Panel [NHANES]    Frequency of Communication with Friends and Family: Twice a week    Frequency of Social Gatherings with Friends and Family: Once a week    Attends Religious Services: More than 4 times per year    Active Member of [employer] or Organizations: Yes    Attends Archivist Meetings: 1 to 4 times per year    Marital Status: Married    Tobacco Counseling Counseling given: Not Answered   Clinical Intake:  Pre-visit preparation completed: No  Pain : 0-10 Pain Score: 6  Pain Type: Chronic pain Pain Location: Knee Pain Orientation: Right Pain Descriptors / Indicators: Aching Pain Onset: More than a month ago Pain Frequency: Intermittent     Nutritional Status: BMI > 30  Obese Nutritional Risks: Non-healing wound Diabetes: Yes CBG done?: No CBG resulted in Enter/ Edit results?: No Did pt. bring in CBG monitor from home?: No  How often do you need to have someone help you when you read instructions, pamphlets, or other written materials from your doctor or pharmacy?: 1 - Never  Diabetic?Nutrition Risk Assessment:  Has the patient had any N/V/D within the last 2 months?  No  Does the patient have any non-healing wounds?  Yes  Has the patient had any unintentional weight loss or weight gain?  No   Diabetes:  Is the patient diabetic?  Yes  If diabetic, was a CBG obtained today?  No  Did the patient bring in their glucometer from home?  No  How often do you monitor your CBG's? Twice a month.   Financial Strains and Diabetes Management:  Are you having any financial strains with the device, your supplies or  your  medication? No .  Does the patient want to be seen by Chronic Care Management for management of their diabetes?  No  Would the patient like to be referred to a Nutritionist or for Diabetic Management?  No   Diabetic Exams:  Diabetic Eye Exam: Completed 05/20/2022 Diabetic Foot Exam: Completed 01/27/2022       Information entered by :: Donnie Mesa, [place]   Activities of Daily Living    08/05/2022   10:26 AM 06/25/2022    8:42 AM  In your present state of health, do you have any difficulty performing the following activities:  Hearing? 0 0  Vision? 1 0  Difficulty concentrating or making decisions? 0 0  Walking or climbing stairs? 1 1  Dressing or bathing? 0 1  Doing errands, shopping? 0 1    Patient Care Team: [name], MD as PCP - General (Family Medicine) Yolonda Kida, MD as Consulting Physician (Cardiology) Lorelee Cover., MD as Consulting Physician (Ophthalmology) Hessie Knows, MD as Consulting Physician (Orthopedic Surgery) [name], DPM as Consulting Physician (Podiatry) [name], MD as Consulting Physician (Oncology) Lucky Cowboy Erskine Squibb, MD as Referring Physician (Vascular Surgery) Germaine Pomfret, [facility] (Pharmacist)  Indicate any recent Medical Services you may have received from other than [facility] providers in the past year (date may be approximate).    No hospitalization in the past 12 months. Assessment:   This is a routine wellness examination for Almendra.  Hearing/Vision screen Denies any hearing issues. Wear glasses, Annual Eye Exam: [facility], admits to some vision abnormality that she contribute to cataract.  Dietary issues and exercise activities discussed:     Goals Addressed             This Visit's Progress    Stay Active and Independent          Why is this important?   Regular activity or exercise is important to managing back pain.  Activity helps to keep your muscles strong.  You will sleep better and feel  more relaxed.  You will have more energy and feel less stressed.  If you are not active now, start slowly. Little changes make a big difference.  Rest, but not too much.  Stay as active as you can and listen to your body's signals.           Depression Screen    08/05/2022   10:25 AM 06/25/2022    8:41 AM 06/19/2022   10:20 AM 05/30/2022    9:39 AM 04/30/2022   10:27 AM 01/27/2022   10:24 AM 12/17/2021   12:59 PM  PHQ 2/9 Scores  PHQ - 2 Score 0 0 0 0 0 0 0  PHQ- 9 Score  0 0 0  0 0    Fall Risk    08/05/2022   10:26 AM 06/25/2022    8:41 AM 06/19/2022   10:20 AM 05/30/2022    9:39 AM 04/30/2022   10:27 AM  Fall Risk   Falls in the past year? 0 0 0 0 0  Number falls in past yr: 0   0 0  Injury with Fall? 0   0 0  Risk for fall due to : No Fall Risks Impaired balance/gait;Impaired mobility No Fall Risks Impaired balance/gait;Impaired mobility Impaired mobility;Impaired balance/gait  Follow up Falls evaluation completed Falls prevention discussed;Education provided Falls evaluation completed Falls prevention discussed Falls prevention discussed    FALL RISK PREVENTION PERTAINING TO THE HOME:  Any stairs in  or around the home? No  If so, are there any without handrails? No  Home free of loose throw rugs in walkways, pet beds, electrical cords, etc? Yes  Adequate lighting in your home to reduce risk of falls? Yes   ASSISTIVE DEVICES UTILIZED TO PREVENT FALLS:  Life alert? No  Use of a cane, walker or w/c? Yes  Grab bars in the bathroom? No  Shower chair or bench in shower? No  Elevated toilet seat or a handicapped toilet? Yes   TIMED UP AND GO:  Was the test performed?  Virtual telephonic visit  .  Cognitive Function:        08/05/2022   10:27 AM 07/31/2020    3:12 PM  6CIT Screen  What Year? 0 points 0 points  What month? 0 points 0 points  What time? 0 points 0 points  Count back from 20 2 points 0 points  Months in reverse 0 points 0 points  Repeat phrase  2 points 0 points  Total Score 4 points 0 points    Immunizations Immunization History  Administered Date(s) Administered   COVID-19, mRNA, vaccine(Comirnaty)12 years and older 07/07/2022   Fluad Quad(high Dose 65+) 05/12/2019, 05/29/2020, 06/07/2021, 05/30/2022   Influenza, High Dose Seasonal PF 05/22/2016, 06/09/2017, 05/21/2018   Influenza,inj,Quad PF,6+ Mos 04/26/2015   Influenza-Unspecified 04/15/2014   Moderna Covid-19 Vaccine Bivalent Booster 87yr & up 07/29/2021   Moderna Sars-Covid-2 Vaccination 10/24/2019, 11/21/2019, 08/15/2020, 01/14/2021   Pneumococcal Conjugate-13 07/09/2006, 01/25/2015   Pneumococcal Polysaccharide-23 03/11/2012   Tdap 06/03/2006, 11/06/2016   Zoster Recombinat (Shingrix) 11/14/2020, 06/07/2021   Zoster, Live 06/19/2010    TDAP status: Up to date  Flu Vaccine status: Up to date  Pneumococcal vaccine status: Up to date  Covid-19 vaccine status: Information provided on how to obtain vaccines.   Qualifies for Shingles Vaccine? Yes   Zostavax completed Yes   Shingrix Completed?: Yes  Screening Tests Health Maintenance  Topic Date Due   COVID-19 Vaccine (6 - 2023-24 season) 05/16/2022   HEMOGLOBIN A1C  10/31/2022   FOOT EXAM  01/28/2023   Diabetic kidney evaluation - Urine ACR  02/06/2023   OPHTHALMOLOGY EXAM  05/21/2023   Diabetic kidney evaluation - GFR measurement  05/31/2023   Medicare Annual Wellness (AWV)  08/06/2023   Pneumonia Vaccine 76 Years old  Completed   INFLUENZA VACCINE  Completed   DEXA SCAN  Completed   Hepatitis C Screening  Completed   Zoster Vaccines- Shingrix  Completed   HPV VACCINES  Aged Out   COLONOSCOPY (Pts 45-461yrInsurance coverage will need to be confirmed)  Discontinued   Fecal DNA (Cologuard)  Discontinued    Health Maintenance  Health Maintenance Due  Topic Date Due   COVID-19 Vaccine (6 - 2023-24 season) 05/16/2022    Colorectal cancer screening: Type of screening: Cologuard. Completed  06/13/2021. Repeat every 3 years  Mammogram status: No longer required due to age.  DEXA Scan: 10/15/2021  Lung Cancer Screening: (Low Dose CT Chest recommended if Age 76-80ears, 30 pack-year currently smoking OR have quit w/in 15years.) does not qualify.    Additional Screening:  Hepatitis C Screening: does qualify; Completed 07/22/2012  Vision Screening: Recommended annual ophthalmology exams for early detection of glaucoma and other disorders of the eye. Is the patient up to date with their annual eye exam?  Yes  Who is the provider or what is the [facility] of the [facility] the [facility]  If pt is not established with a provider, would they like to be referred to a provider to establish care? No .   Dental Screening: Recommended annual dental exams for proper oral hygiene  Community Resource Referral / Chronic Care Management: CRR required this visit?  Yes   CCM required this visit?  Yes      Plan:     I have personally reviewed and noted the following in the patient's chart:   Medical and social history Use of alcohol, tobacco or illicit drugs  Current medications and supplements including opioid prescriptions. Patient is currently taking opioid prescriptions. Information provided to patient regarding non-opioid alternatives. Patient advised to discuss non-opioid treatment plan with their provider. Functional ability and status Nutritional status Physical activity Advanced directives List of other physicians Hospitalizations, surgeries, and ER visits in previous 12 months Vitals Screenings to include cognitive, depression, and falls Referrals and appointments  In addition, I have reviewed and discussed with patient certain preventive protocols, quality metrics, and best practice recommendations. A written personalized care plan for preventive services as well as general preventive health recommendations were provided to  patient.    Ms. Lamb , Thank you for taking time to come for your Medicare Wellness Visit. I appreciate your ongoing commitment to your health goals. Please review the following plan we discussed and let me know if I can assist you in the future.   These are the goals we discussed:  Goals      DIET - INCREASE WATER INTAKE     Recommend drinking 6-8 glasses of water per day      Find Help in My Community     Timeframe:  Short-Term Goal Priority:  Low Start Date:      06/21/21                       Expected End Date:     06/21/21                  Follow Up Date    - follow-up on any referrals for help I am given - think ahead to make sure my need does not become an emergency - have a back-up plan - make a list of family or friends that I can call -please call this social worker with any additional community resource needs    Why is this important?   Knowing how and where to find help for yourself or family in your neighborhood and community is an important skill.  You will want to take some steps to learn how.    Notes:      Stay Active and Independent        Why is this important?   Regular activity or exercise is important to managing back pain.  Activity helps to keep your muscles strong.  You will sleep better and feel more relaxed.  You will have more energy and feel less stressed.  If you are not active now, start slowly. Little changes make a big difference.  Rest, but not too much.  Stay as active as you can and listen to your body's signals.            This is a list of the screening recommended for you and due dates:  Health Maintenance  Topic Date Due   COVID-19 Vaccine (6 - 2023-24 season) 05/16/2022   Hemoglobin A1C  10/31/2022   Complete foot exam   01/28/2023   Yearly  kidney health urinalysis for diabetes  02/06/2023   Eye exam for diabetics  05/21/2023   Yearly kidney function blood test for diabetes  05/31/2023   Medicare Annual Wellness Visit   08/06/2023   Pneumonia Vaccine  Completed   Flu Shot  Completed   DEXA scan (bone density measurement)  Completed   Hepatitis C Screening: USPSTF Recommendation to screen - Ages 33-79 yo.  Completed   Zoster (Shingles) Vaccine  Completed   HPV Vaccine  Aged Out   Colon Cancer Screening  Discontinued   Cologuard (Stool DNA test)  Discontinued     Wilson Singer, [facility]   08/05/2022   Nurse Notes:Approximately 30 minute Non-Face -To-Face Medicare Wellness Visit

## 2022-08-04 NOTE — Patient Instructions (Signed)

## 2022-08-05 ENCOUNTER — Ambulatory Visit (INDEPENDENT_AMBULATORY_CARE_PROVIDER_SITE_OTHER): Payer: Medicare Other

## 2022-08-05 DIAGNOSIS — Z599 Problem related to housing and economic circumstances, unspecified: Secondary | ICD-10-CM | POA: Diagnosis not present

## 2022-08-05 DIAGNOSIS — Z0000 Encounter for general adult medical examination without abnormal findings: Secondary | ICD-10-CM | POA: Diagnosis not present

## 2022-08-06 ENCOUNTER — Telehealth: Payer: Self-pay | Admitting: *Deleted

## 2022-08-06 NOTE — Progress Notes (Signed)
  Care Coordination  Outreach Note  08/06/2022 Name: Becky Trevino MRN: [id number] DOB: [birth year]   Care Coordination Outreach Attempts: An unsuccessful telephone outreach was attempted today to offer the patient information about available care coordination services as a benefit of their health plan.   Received referral   Follow Up Plan:  Additional outreach attempts will be made to offer the patient care coordination information and services.   Encounter Outcome:  No Answer  Julian Hy, [place] Direct Dial: [phone]

## 2022-08-11 NOTE — Progress Notes (Signed)
  Care Coordination   Note   08/11/2022 Name: [name] MRN: [id number] DOB: 1945/12/21  Becky Trevino is a 76 y.o. year old female who sees [name], MD for primary care. I reached out to [name] by phone today to offer care coordination services.  [name] was given information about Care Coordination services today including:   The Care Coordination services include support from the care team which includes your Nurse Coordinator, Clinical Social Worker, or Pharmacist.  The Care Coordination team is here to help remove barriers to the health concerns and goals most important to you. Care Coordination services are voluntary, and the patient may decline or stop services at any time by request to their care team member.   Care Coordination Consent Status: Patient agreed to services and verbal consent obtained.   Follow up plan:  Telephone appointment with care coordination team member scheduled for:  08/15/2022  Encounter Outcome:  Pt. Scheduled from referral   Julian Hy, [place] Direct Dial: [phone]

## 2022-08-14 ENCOUNTER — Ambulatory Visit (LOCAL_COMMUNITY_HEALTH_CENTER): Payer: Medicare Other

## 2022-08-14 DIAGNOSIS — Z23 Encounter for immunization: Secondary | ICD-10-CM | POA: Diagnosis not present

## 2022-08-15 ENCOUNTER — Telehealth: Payer: Self-pay

## 2022-08-15 NOTE — Patient Outreach (Signed)
  Care Coordination   08/15/2022 Name: Becky Trevino MRN: [id number] DOB: Dec 10, 1945   Care Coordination Outreach Attempts:  An unsuccessful telephone outreach was attempted for a scheduled appointment today.   Follow Up Plan:  Additional outreach attempts will be made to offer the patient care coordination information and services.   Encounter Outcome:  No Answer   Care Coordination Interventions:  No, not indicated    Daneen Schick, BSW, CDP Social Worker, Certified Dementia Practitioner [facility]  Care Coordination [phone]

## 2022-08-18 ENCOUNTER — Telehealth: Payer: Self-pay

## 2022-08-18 NOTE — Progress Notes (Signed)
Chronic Care Management Pharmacy Assistant   Name: Becky Trevino  MRN: [id number] DOB: 14-Jun-1946  Reason for Encounter: Patient Assistance Follow-up   I contacted Becky Trevino, and spoke with the representative whom advised the patient had been approved for the 2023 year. The representative informed me that they reached out the patient to speak with her about the program, and shipping but patient did not answer. I informed her I would contact the patient and provide their number of [phone] to have the patient call since I am not allowed to approve her shipping.  Per representative also to qualify for patient assistance for next year medicare patients still have to reach 3% out-of-pocket of their yearly income and they now have to apply for [place] via social security before applying for patient assistance with [place]. If they receive a denial letter from social security regarding [place] than they can apply for patient assistance as long as they spent to 3% out of pocket and they have to provide proof of income along with the denial letter.  Patient has been updated on her approval status and provided th phone number to contact to approve shipping.  Medications: Outpatient Encounter Medications as of 08/18/2022  Medication Sig   ACCU-CHEK GUIDE test strip TEST ONCE A DAY AS DIRECTED   acetaminophen (TYLENOL) 650 MG CR tablet Take 650-1,300 mg by mouth 2 (two) times daily as needed for pain.   amiodarone (PACERONE) 200 MG tablet Take 200 mg by mouth daily.   amoxicillin (AMOXIL) 500 MG capsule Take 1 capsule (500 mg total) by mouth 2 (two) times daily.   apixaban (ELIQUIS) 5 MG TABS tablet Take 1 tablet by mouth 2 (two) times daily.   atorvastatin (LIPITOR) 40 MG tablet Take 1 tablet (40 mg total) by mouth every evening.   blood glucose meter kit and supplies 1 each by Other route daily at 12 noon. Dispense based on patient and insurance preference. Check fsbs once a  day   Calcium Carbonate-Vitamin D 600-400 MG-UNIT tablet Take 1 tablet by mouth 2 (two) times daily.   dapagliflozin propanediol (FARXIGA) 10 MG TABS tablet Take 1 tablet (10 mg total) by mouth daily before breakfast. Patient receives via [employer] Patient Assistance   escitalopram (LEXAPRO) 5 MG tablet Take 1 tablet (5 mg total) by mouth daily.   fluticasone (FLONASE) 50 MCG/ACT nasal spray SPRAY 2 SPRAYS INTO EACH NOSTRIL EVERY DAY   hydroquinone 4 % cream Apply topically at bedtime.   latanoprost (XALATAN) 0.005 % ophthalmic solution SMARTSIG:In Eye(s)   levothyroxine (SYNTHROID) 50 MCG tablet TAKE 1 TABLET BY MOUTH EVERY DAY   Magnesium Oxide 500 MG CAPS Take 500 mg by mouth daily.    metoprolol succinate (TOPROL-XL) 50 MG 24 hr tablet Take 50 mg by mouth daily.   Oxycodone HCl 10 MG TABS Take 1 tablet by mouth 4 (four) times daily as needed.   polyethylene glycol (MIRALAX / GLYCOLAX) 17 g packet Take 17 g by mouth daily. (Patient taking differently: Take 17 g by mouth every other day.)   potassium chloride (KLOR-CON) 10 MEQ tablet Take 10 mEq by mouth daily.   SIMBRINZA 1-0.2 % SUSP Place 1 drop into both eyes 2 (two) times daily.   sodium chloride (OCEAN) 0.65 % SOLN nasal spray Place 1 spray into both nostrils 3 (three) times daily.   torsemide (DEMADEX) 20 MG tablet TAKE 1 TABLET BY MOUTH EVERY DAY   traZODone (DESYREL) 50 MG tablet Take  0.5-1 tablets (25-50 mg total) by mouth at bedtime as needed. for sleep   Vibegron (GEMTESA) 75 MG TABS Take 75 mg by mouth daily. (Patient not taking: Reported on 08/05/2022)   No facility-administered encounter medications on file as of 08/18/2022.    Lynann Bologna, CPA/CMA Clinical Pharmacist Assistant Phone: [phone]

## 2022-08-19 ENCOUNTER — Telehealth: Payer: Self-pay | Admitting: *Deleted

## 2022-08-19 NOTE — Progress Notes (Unsigned)
  Care Coordination Note  08/19/2022 Name: LAKECHIA NAY MRN: [id number] DOB: [birth year]  GLENDY BARSANTI is a 76 y.o. year old female who is a primary care patient of [name], MD and is actively engaged with the care management team. I reached out to [name] by phone today to assist with re-scheduling an initial visit with the BSW  Follow up plan: Unsuccessful telephone outreach attempt made. A HIPAA compliant phone message was left for the patient providing contact information and requesting a return call.   Julian Hy, [place] Direct Dial: [phone]

## 2022-08-20 NOTE — Progress Notes (Signed)
  Care Coordination Note  08/20/2022 Name: Becky Trevino MRN: [id number] DOB: Mar 19, 1946  Becky Trevino is a 76 y.o. year old female who is a primary care patient of [name], MD and is actively engaged with the care management team. I reached out to Becky Trevino by phone today to assist with re-scheduling an initial visit with the BSW  Follow up plan: Telephone appointment with care management team member scheduled for: 08/27/2022  Becky Trevino, [name] Direct Dial: [phone]

## 2022-08-25 ENCOUNTER — Other Ambulatory Visit: Payer: Self-pay | Admitting: Family Medicine

## 2022-08-25 DIAGNOSIS — J3089 Other allergic rhinitis: Secondary | ICD-10-CM

## 2022-08-26 ENCOUNTER — Encounter: Payer: Self-pay | Admitting: Oncology

## 2022-08-26 ENCOUNTER — Inpatient Hospital Stay (HOSPITAL_BASED_OUTPATIENT_CLINIC_OR_DEPARTMENT_OTHER): Payer: Medicare Other | Admitting: Oncology

## 2022-08-26 ENCOUNTER — Inpatient Hospital Stay: Payer: Medicare Other | Attending: Oncology

## 2022-08-26 VITALS — BP 119/59 | HR 58 | Temp 96.7°F

## 2022-08-26 DIAGNOSIS — Z86718 Personal history of other venous thrombosis and embolism: Secondary | ICD-10-CM

## 2022-08-26 DIAGNOSIS — I87009 Postthrombotic syndrome without complications of unspecified extremity: Secondary | ICD-10-CM | POA: Insufficient documentation

## 2022-08-26 DIAGNOSIS — N189 Chronic kidney disease, unspecified: Secondary | ICD-10-CM | POA: Insufficient documentation

## 2022-08-26 DIAGNOSIS — I87099 Postthrombotic syndrome with other complications of unspecified lower extremity: Secondary | ICD-10-CM

## 2022-08-26 DIAGNOSIS — D631 Anemia in chronic kidney disease: Secondary | ICD-10-CM | POA: Insufficient documentation

## 2022-08-26 DIAGNOSIS — Z95828 Presence of other vascular implants and grafts: Secondary | ICD-10-CM | POA: Diagnosis not present

## 2022-08-26 DIAGNOSIS — I82431 Acute embolism and thrombosis of right popliteal vein: Secondary | ICD-10-CM | POA: Insufficient documentation

## 2022-08-26 DIAGNOSIS — D649 Anemia, unspecified: Secondary | ICD-10-CM

## 2022-08-26 DIAGNOSIS — Z7901 Long term (current) use of anticoagulants: Secondary | ICD-10-CM | POA: Diagnosis not present

## 2022-08-26 LAB — RETIC PANEL
Immature Retic Fract: 23.8 % — ABNORMAL HIGH (ref 2.3–15.9)
RBC.: 4 MIL/uL (ref 3.87–5.11)
Retic Count, Absolute: 72 10*3/uL (ref 19.0–186.0)
Retic Ct Pct: 1.8 % (ref 0.4–3.1)
Reticulocyte Hemoglobin: 26.8 pg — ABNORMAL LOW (ref >27.9)

## 2022-08-26 LAB — CBC WITH DIFFERENTIAL/PLATELET
Abs Immature Granulocytes: 0.02 10*3/uL (ref 0.00–0.07)
Basophils Absolute: 0 10*3/uL (ref 0.0–0.1)
Basophils Relative: 0 %
Eosinophils Absolute: 0.1 10*3/uL (ref 0.0–0.5)
Eosinophils Relative: 2 %
HCT: 35.8 % — ABNORMAL LOW (ref 36.0–46.0)
Hemoglobin: 10.8 g/dL — ABNORMAL LOW (ref 12.0–15.0)
Immature Granulocytes: 0 %
Lymphocytes Relative: 21 %
Lymphs Abs: 1 10*3/uL (ref 0.7–4.0)
MCH: 26.7 pg (ref 26.0–34.0)
MCHC: 30.2 g/dL (ref 30.0–36.0)
MCV: 88.4 fL (ref 80.0–100.0)
Monocytes Absolute: 0.5 10*3/uL (ref 0.1–1.0)
Monocytes Relative: 10 %
Neutro Abs: 3.2 10*3/uL (ref 1.7–7.7)
Neutrophils Relative %: 67 %
Platelets: 243 10*3/uL (ref 150–400)
RBC: 4.05 MIL/uL (ref 3.87–5.11)
RDW: 20 % — ABNORMAL HIGH (ref 11.5–15.5)
WBC: 4.9 10*3/uL (ref 4.0–10.5)
nRBC: 0 % (ref 0.0–0.2)

## 2022-08-26 LAB — COMPREHENSIVE METABOLIC PANEL
ALT: 13 U/L (ref 0–44)
AST: 20 U/L (ref 15–41)
Albumin: 3 g/dL — ABNORMAL LOW (ref 3.5–5.0)
Alkaline Phosphatase: 82 U/L (ref 38–126)
Anion gap: 6 (ref 5–15)
BUN: 21 mg/dL (ref 8–23)
CO2: 29 mmol/L (ref 22–32)
Calcium: 8.5 mg/dL — ABNORMAL LOW (ref 8.9–10.3)
Chloride: 106 mmol/L (ref 98–111)
Creatinine, Ser: 1.24 mg/dL — ABNORMAL HIGH (ref 0.44–1.00)
GFR, Estimated: 45 mL/min — ABNORMAL LOW (ref >60)
Glucose, Bld: 92 mg/dL (ref 70–99)
Potassium: 4.2 mmol/L (ref 3.5–5.1)
Sodium: 141 mmol/L (ref 135–145)
Total Bilirubin: 0.4 mg/dL (ref 0.3–1.2)
Total Protein: 8.7 g/dL — ABNORMAL HIGH (ref 6.5–8.1)

## 2022-08-26 LAB — IRON AND TIBC
Iron: 28 ug/dL (ref 28–170)
Saturation Ratios: 10 % — ABNORMAL LOW (ref 10.4–31.8)
TIBC: 277 ug/dL (ref 250–450)
UIBC: 249 ug/dL

## 2022-08-26 LAB — VITAMIN B12: Vitamin B-12: 516 pg/mL (ref 180–914)

## 2022-08-26 LAB — FOLATE: Folate: 9.7 ng/mL (ref >5.9)

## 2022-08-26 LAB — FERRITIN: Ferritin: 52 ng/mL (ref 11–307)

## 2022-08-26 MED ORDER — FERROUS SULFATE 325 (65 FE) MG PO TBEC: 325.0000 mg | DELAYED_RELEASE_TABLET | Freq: Every day | ORAL | 4 refills | Status: DC

## 2022-08-26 NOTE — Addendum Note (Signed)
Addended by: [name] on: 08/26/2022 07:32 PM   Modules accepted: Orders

## 2022-08-26 NOTE — Assessment & Plan Note (Signed)
Chronic anemia, Today's blood work showed hemoglobin 10.8, iron saturation 10%, ferritin 50.  Normal vitamin B12, normal folate. Anemia is likely secondary to chronic kidney disease.  Currently hemoglobin is above 10 No need for erythropoietin therapy. Recommend patient to start oral iron supplementation ferrous sulfate 325 mg daily.

## 2022-08-26 NOTE — Progress Notes (Signed)
Hematology/Oncology Follow Up Note [facility]  Telephone:([phone] [phone]) [phone]  Patient Care Team: [name], MD as PCP - General (Family Medicine) Yolonda Kida, MD as Consulting Physician (Cardiology) Lorelee Cover., MD as Consulting Physician (Ophthalmology) Hessie Knows, MD as Consulting Physician (Orthopedic Surgery) [name], DPM as Consulting Physician (Podiatry) [name], MD as Consulting Physician (Oncology) Lucky Cowboy Erskine Squibb, MD as Referring Physician (Vascular Surgery) Germaine Pomfret, [facility] (Pharmacist)   ASSESSMENT & PLAN:   Chronic venous hypertension due to deep vein thrombosis (DVT) Patient has IVC filter. Continue chronic anticoagulation with Eliquis 5 mg twice daily.  Anemia due to chronic kidney disease Chronic anemia, Today's blood work showed hemoglobin 10.8, iron saturation 10%, ferritin 50.  Normal vitamin B12, normal folate. Anemia is likely secondary to chronic kidney disease.  Currently hemoglobin is above 10 No need for erythropoietin therapy. Recommend patient to start oral iron supplementation ferrous sulfate 325 mg daily.   Orders Placed This Encounter  Procedures   Ferritin    Standing Status:   Future    Number of Occurrences:   1    Standing Expiration Date:   02/25/2023   Iron and TIBC    Standing Status:   Future    Number of Occurrences:   1    Standing Expiration Date:   08/27/2023   Vitamin B12    Standing Status:   Future    Number of Occurrences:   1    Standing Expiration Date:   08/27/2023   Folate    Standing Status:   Future    Number of Occurrences:   1    Standing Expiration Date:   08/27/2023   Comprehensive metabolic panel    Standing Status:   Future    Number of Occurrences:   1    Standing Expiration Date:   08/27/2023   CBC with Differential/Platelet    Standing Status:   Future    Number of Occurrences:   1    Standing Expiration Date:   08/27/2023   Retic  Panel    Standing Status:   Future    Number of Occurrences:   1    Standing Expiration Date:   08/27/2023   Follow up in 4 months.  All questions were answered. The patient knows to call the clinic with any problems, questions or concerns.  [name], MD, PhD [facility] Hematology Oncology 08/26/2022   REASON FOR VISIT  follow-up for DVT. Anemia due to CKD Pertinent hematology history 76 y.o. female with past medical history listed as below presents for follow-up of DVT. She was admitted from 10/27/2019-11/07/2019 for right lower extremity cellulitis/wound infection, bacteremia with group B streptococcus, right lower extremity DVT. # Patient had ankle fracture status post a recent right ankle ORIF on 09/02/2019 by Dr. Araceli Bouche.  Patient has a wound VAC She was noticed to have right lower extremity swelling, mild erythema and warmth with associated tenderness. 11/24/2019 US venous right lower extremity showed a DVT within the right popliteal vein, with superficial thrombus seen at the saphenofemoral junction. Patient tells me that she was diagnosed with "blood clots" in August 2020.  At that time she had pain and swelling for about a week and went to ER for evaluation.    04/29/2019 ultrasound was negative for DVT, only showed a superficial thrombophlebitis involving a branch of the great saphenous vein in the proximal thigh.  She was fine to have right Baker's cyst.  Patient denies any immobilization factors contributing to this event.  Patient had elevated D-dimer at that time. Patient was started on Xarelto-starter kit by emergency room.  Patient was recommended to have US aorta iliac Doppler by primary care provider Dr. Ancil Boozer and I do not see the study was done.  Xarelto was continued outpatient for thrombophlebitis..  Patient was hospitalized again at the end of November 2020 due to syncope episodes which likely due to orthostatic hypotension.  Patient was noted to have anemia, CT abdomen pelvis  to evaluate after trauma, showed left gluteal hematoma without signs of extravasation.  Xarelto was held during the hospitalization.  Saw cardiology [place].  Patient was seen by Dr. Ancil Boozer on 08/23/2019 and the Xarelto was stopped.   09/02/2019 patient slipped and obtained right ankle fracture.  Status post ORIF procedure by Dr. Luana Shu.  Postop patient was recommended to start on Lovenox 40 mg daily injections prophylactically until her presentation to emergency room in February.  At that point, she was found to have a provoked right lower extremity DVT and she was seen by me during that admission.  Patient was recommended to start therapeutic Lovenox.  During the same hospitalization, patient developed spontaneous iliopsoas hematoma and therapeutic Lovenox was discontinued. Patient had IVC filter placed on 11/02/2019.  Patient was recommended to have outpatient follow-up with me. Patient was recently seen by Dr. Ancil Boozer and was found to have worsening of right lower extremity swelling, Doppler ultrasound was obtained on 02/03/2020 which was positive for DVT in the right lower extremity, increased clot burden in the right lower extremity since 10/27/2019.  Thrombus extending from the right common femoral vein to the right calf.  Patient was referred back to me for evaluation and management.  Currently off anticoagulation due to recurrent hematoma.  IVC filter was placed.  No plan for filter retrieval due to risk of recurrent lower extremity thrombosis. Negative antiphospholipid syndrome antibodies, prothrombin gene mutation, factor V Leiden mutation.  INTERVAL HISTORY BRETTE CAST is a 76 y.o. female who has above history reviewed by me today presents for follow up visit for DVT Patient was last seen by me in 2021.  Patient present to reestablish care. Patient has chronic lower extremity DVT, on chronic anticoagulation.  Patient also has IVC filter.  Anticoagulation was held in the past due to pelvic  hematoma, and cardiology resumed anticoagulation.  Currently she is on Eliquis 5 mg twice daily.  Patient reports feeling tired and fatigued. Chronic lower extremity swelling. + chronic fatigue.   Review of Systems  Constitutional:  Positive for fatigue. Negative for appetite change, chills and fever.  HENT:   Negative for hearing loss and voice change.   Eyes:  Negative for eye problems.  Respiratory:  Negative for chest tightness and cough.   Cardiovascular:  Positive for leg swelling. Negative for chest pain.       Hip/thigh swelling  Gastrointestinal:  Negative for abdominal distention, abdominal pain and blood in stool.  Endocrine: Negative for hot flashes.  Genitourinary:  Negative for difficulty urinating, frequency and nocturia.   Musculoskeletal:  Negative for arthralgias.  Skin:  Negative for itching and rash.  Neurological:  Negative for extremity weakness.  Hematological:  Negative for adenopathy.  Psychiatric/Behavioral:  Negative for confusion.       Allergies  Allergen Reactions   Lisinopril Swelling     Past Medical History:  Diagnosis Date   Cervical spondylosis    Chronic kidney disease    Stage 3  DDD (degenerative disc disease), cervical    [facility]   Diabetes mellitus without complication ([place])    Diverticulosis    Hyperlipidemia    Hypertension    Intertrigo    Leg cramps    Leg varices    Obesity    OSA (obstructive sleep apnea)    Symptomatic menopausal or female climacteric states    Syncope      Past Surgical History:  Procedure Laterality Date   ABDOMINAL HYSTERECTOMY  1993   Total   BUNIONECTOMY Bilateral 1993   I & D KNEE WITH POLY EXCHANGE Right 11/19/2017   Procedure: RIGHT KNEE POLY EXCHANGE WITH IRRIGATION AND DEBRIDEMENT;  Surgeon: Hessie Knows, MD;  Location: [facility] ORS;  Service: Orthopedics;  Laterality: Right;   IR FLUORO GUIDED NEEDLE PLC ASPIRATION/INJECTION LOC  09/19/2021   JOINT REPLACEMENT     bilateral knee    LAMINECTOMY  11/14/2013    Cervical Fusion , [facility], Dr. Delilah Shan   LASER ABLATION Bilateral 07/29/2012   Dr. Lucky Cowboy   LOWER EXTREMITY ANGIOGRAPHY Right 11/02/2019   Procedure: Lower Extremity Angiography;  Surgeon: Katha Cabal, MD;  Location: [facility];  Service: Cardiovascular;  Laterality: Right;   ORIF ANKLE FRACTURE Right 09/02/2019   Procedure: OPEN REDUCTION INTERNAL FIXATION (ORIF) ANKLE FRACTURE BIMALLEOLAR;  Surgeon: [name], DPM;  Location: [facility] OR[facility];  Service: Podiatry;  Laterality: Right;   PERIPHERAL VASCULAR THROMBECTOMY Right 03/02/2020   Procedure: PERIPHERAL VASCULAR THROMBECTOMY;  Surgeon: Algernon Huxley, MD;  Location: [place];  Service: Cardiovascular;  Laterality: Right;   REPLACEMENT TOTAL KNEE Left 03/2010   Dr. Rudene Christians   TEE WITHOUT CARDIOVERSION N/A 09/19/2021   Procedure: TRANSESOPHAGEAL ECHOCARDIOGRAM (TEE);  Surgeon: Corey Skains, MD;  Location: [facility] OR[facility];  Service: Cardiovascular;  Laterality: N/A;   TOTAL HIP ARTHROPLASTY Left 12/11/2015   Procedure: TOTAL HIP ARTHROPLASTY ANTERIOR APPROACH;  Surgeon: Hessie Knows, MD;  Location: [facility];  Service: Orthopedics;  Laterality: Left;   TOTAL KNEE ARTHROPLASTY Right 03/11/2016   Procedure: TOTAL KNEE ARTHROPLASTY;  Surgeon: Hessie Knows, MD;  Location: [facility];  Service: Orthopedics;  Laterality: Right;    Social History   Socioeconomic History   Marital status: Married    Spouse name: Kristalyn Bergstresser   Number of children: 1   Years of education: Not on file   Highest education level: Not on file  Occupational History   Occupation: Retired  Tobacco Use   Smoking status: Never   Smokeless tobacco: Never  Vaping Use   Vaping Use: Never used  Substance and Sexual Activity   Alcohol use: No    Alcohol/week: 0.0 standard drinks of alcohol   Drug use: No   Sexual activity: Yes    Partners: Male  Other Topics Concern   Not on file  Social History Narrative   Married, one adopted grown  child    Social Determinants of Health   Financial Resource Strain: High Risk (08/05/2022)   Overall Financial Resource Strain (CARDIA)    Difficulty of Paying Living Expenses: Very hard  Food Insecurity: No Food Insecurity (05/28/2022)   Hunger Vital Sign    Worried About Running Out of Food in the Last Year: Never true    [place] in the Last Year: Never true  Transportation Needs: No Transportation Needs (08/05/2022)   PRAPARE - Hydrologist (Medical): No    Lack of Transportation (Non-Medical): No  Physical Activity: Insufficiently  Active (08/05/2022)   Exercise Vital Sign    Days of Exercise per Week: 5 days    Minutes of Exercise per Session: 20 min  Stress: No Stress Concern Present (08/05/2022)   [place]    Feeling of Stress : Not at all  Social Connections: [place] (08/05/2022)   Social Connection and Isolation Panel [NHANES]    Frequency of Communication with Friends and Family: Twice a week    Frequency of Social Gatherings with Friends and Family: Once a week    Attends Religious Services: More than 4 times per year    Active Member of [employer] or Organizations: Yes    Attends Archivist Meetings: 1 to 4 times per year    Marital Status: Married  Human resources officer Violence: Not At Risk (08/05/2022)   Humiliation, Afraid, Rape, and Kick questionnaire    Fear of Current or Ex-Partner: No    Emotionally Abused: No    Physically Abused: No    Sexually Abused: No    Family History  Problem Relation Age of Onset   Diabetes Mother    Hyperlipidemia Mother    Hypertension Mother    Diabetes Father    Hyperlipidemia Father    Hypertension Father    Obesity Father    Hypertension Sister    Hyperlipidemia Sister    Hyperlipidemia Sister    Breast cancer Neg Hx      Current Outpatient Medications:    ACCU-CHEK GUIDE test strip, TEST ONCE A DAY  AS DIRECTED, Disp: 50 strip, Rfl: 1   acetaminophen (TYLENOL) 650 MG CR tablet, Take 650-1,300 mg by mouth 2 (two) times daily as needed for pain., Disp: , Rfl:    amiodarone (PACERONE) 200 MG tablet, Take 200 mg by mouth daily., Disp: , Rfl:    amoxicillin (AMOXIL) 500 MG capsule, Take 1 capsule (500 mg total) by mouth 2 (two) times daily., Disp: 60 capsule, Rfl: 12   apixaban (ELIQUIS) 5 MG TABS tablet, Take 1 tablet by mouth 2 (two) times daily., Disp: , Rfl:    atorvastatin (LIPITOR) 40 MG tablet, Take 1 tablet (40 mg total) by mouth every evening., Disp: 90 tablet, Rfl: 1   blood glucose meter kit and supplies, 1 each by Other route daily at 12 noon. Dispense based on patient and insurance preference. Check fsbs once a day, Disp: 1 each, Rfl: 0   Calcium Carbonate-Vitamin D 600-400 MG-UNIT tablet, Take 1 tablet by mouth 2 (two) times daily., Disp: 60 tablet, Rfl: 0   dapagliflozin propanediol (FARXIGA) 10 MG TABS tablet, Take 1 tablet (10 mg total) by mouth daily before breakfast. Patient receives via [employer] Patient Assistance, Disp: 90 tablet, Rfl: 3   escitalopram (LEXAPRO) 5 MG tablet, Take 1 tablet (5 mg total) by mouth daily., Disp: 90 tablet, Rfl: 1   fluticasone (FLONASE) 50 MCG/ACT nasal spray, SPRAY 2 SPRAYS INTO EACH NOSTRIL EVERY DAY, Disp: 48 mL, Rfl: 0   hydroquinone 4 % cream, Apply topically at bedtime., Disp: , Rfl:    latanoprost (XALATAN) 0.005 % ophthalmic solution, SMARTSIG:In Eye(s), Disp: , Rfl:    levothyroxine (SYNTHROID) 50 MCG tablet, TAKE 1 TABLET BY MOUTH EVERY DAY, Disp: 90 tablet, Rfl: 0   Magnesium Oxide 500 MG CAPS, Take 500 mg by mouth daily. , Disp: , Rfl:    metoprolol succinate (TOPROL-XL) 50 MG 24 hr tablet, Take 50 mg by mouth daily., Disp: , Rfl:  Oxycodone HCl 10 MG TABS, Take 1 tablet by mouth 4 (four) times daily as needed., Disp: , Rfl:    polyethylene glycol (MIRALAX / GLYCOLAX) 17 g packet, Take 17 g by mouth daily. (Patient taking differently: Take  17 g by mouth every other day.), Disp: 14 each, Rfl: 0   potassium chloride (KLOR-CON) 10 MEQ tablet, Take 10 mEq by mouth daily., Disp: , Rfl:    SIMBRINZA 1-0.2 % SUSP, Place 1 drop into both eyes 2 (two) times daily., Disp: , Rfl:    sodium chloride (OCEAN) 0.65 % SOLN nasal spray, Place 1 spray into both nostrils 3 (three) times daily., Disp: 30 mL, Rfl: 0   torsemide (DEMADEX) 20 MG tablet, TAKE 1 TABLET BY MOUTH EVERY DAY, Disp: 90 tablet, Rfl: 1   traZODone (DESYREL) 50 MG tablet, Take 0.5-1 tablets (25-50 mg total) by mouth at bedtime as needed. for sleep, Disp: 90 tablet, Rfl: 0   Vibegron (GEMTESA) 75 MG TABS, Take 75 mg by mouth daily. (Patient not taking: Reported on 08/05/2022), Disp: 90 tablet, Rfl: 3  Physical exam:  Vitals:   08/26/22 1018  BP: (!) 119/59  Pulse: (!) 58  Temp: (!) 96.7 F (35.9 C)  TempSrc: Tympanic   Physical Exam Constitutional:      General: She is not in acute distress.    Appearance: She is obese.     Comments: Patient sits in the wheelchair  HENT:     Head: Normocephalic and atraumatic.  Eyes:     General: No scleral icterus. Cardiovascular:     Rate and Rhythm: Normal rate and regular rhythm.     Heart sounds: Normal heart sounds.  Pulmonary:     Effort: Pulmonary effort is normal. No respiratory distress.     Breath sounds: No wheezing.  Abdominal:     General: Bowel sounds are normal. There is no distension.     Palpations: Abdomen is soft.  Musculoskeletal:        General: Swelling present. No deformity. Normal range of motion.     Cervical back: Normal range of motion and neck supple.     Comments: Bilateral lower extremity swelling  Skin:    General: Skin is warm and dry.     Findings: No erythema or rash.  Neurological:     Mental Status: She is alert and oriented to person, place, and time. Mental status is at baseline.     Cranial Nerves: No cranial nerve deficit.     Coordination: Coordination normal.  Psychiatric:         Mood and Affect: Mood normal.        Latest Ref Rng & Units 08/26/2022   10:40 AM  CMP  Glucose 70 - 99 mg/dL 92   BUN 8 - 23 mg/dL 21   Creatinine 0.44 - 1.00 mg/dL 1.24   Sodium 135 - 145 mmol/L 141   Potassium 3.5 - 5.1 mmol/L 4.2   Chloride 98 - 111 mmol/L 106   CO2 22 - 32 mmol/L 29   Calcium 8.9 - 10.3 mg/dL 8.5   Total Protein 6.5 - 8.1 g/dL 8.7   Total Bilirubin 0.3 - 1.2 mg/dL 0.4   Alkaline Phos 38 - 126 U/L 82   AST 15 - 41 U/L 20   ALT 0 - 44 U/L 13       Latest Ref Rng & Units 08/26/2022   10:40 AM  CBC  WBC 4.0 - 10.5 K/uL 4.9  Hemoglobin 12.0 - 15.0 g/dL 10.8   Hematocrit 36.0 - 46.0 % 35.8   Platelets 150 - 400 K/uL 243     RADIOGRAPHIC STUDIES: I have personally reviewed the radiological images as listed and agreed with the findings in the report. No results found.

## 2022-08-26 NOTE — Assessment & Plan Note (Addendum)
Patient has IVC filter. Continue chronic anticoagulation with Eliquis 5 mg twice daily.

## 2022-08-27 ENCOUNTER — Telehealth: Payer: Self-pay

## 2022-08-27 NOTE — Patient Outreach (Signed)
  Care Coordination   08/27/2022 Name: Becky Trevino MRN: [id number] DOB: [birth year]   Care Coordination Outreach Attempts:  An unsuccessful telephone outreach was attempted for a scheduled appointment today.  Follow Up Plan:  Additional outreach attempts will be made to offer the patient care coordination information and services.   Encounter Outcome:  No Answer   Care Coordination Interventions:  No, not indicated    Daneen Schick, BSW, CDP Social Worker, Certified Dementia Practitioner [facility]  Care Coordination [phone]

## 2022-08-27 NOTE — Progress Notes (Signed)
Home visit for RSV vaccine - tolerated well Shaune Spittle, RN

## 2022-08-27 NOTE — Telephone Encounter (Signed)
Unable to reach pt, multiple attempts made. Detailed message left on VM.   Labs in 4 months MD/ Venofer 1-2 days AFTER labs.   Please attempt to inform pt of appt and mail appt reminder. Thanks

## 2022-08-27 NOTE — Telephone Encounter (Signed)
-----   Message from [name], MD sent at 08/26/2022  7:29 PM EST ----- Please let patient know that I recommend her to start oral iron supplementation.  I have sent her prescription.  Follow up in 4 months, labs prior to MD +/- Venofer - labs are ordered.   zy

## 2022-08-28 NOTE — Progress Notes (Signed)
Name: Becky Trevino   MRN: [id number]    DOB: 1946-05-05   Date:08/29/2022       Progress Note  Subjective  Chief Complaint  Follow Up  HPI  DMII with renal manifestation and obesity:  she denies polyuria, polyphagia or polydipsia. A1Cis normal   She has CKI - last GFR improved up to 45   She also has  dyslipidemia and obesity . She cannot take ACE due to angioedema, A1C is at goal, taking [place] for CKI . She states statins for dyslipidemia. She has neuropathy and sees Podiatrist on a regular basis.    Obesity Morbid: weight is gradually improving, likely a combination of thyroid function , farxiga and also wearing a lymph pump daily. Continue the good work    Chronic DVT  right popliteal vein: diagnosed 10/27/2019,  she has an IV filter. Dr. Tasia Catchings had stopped Eliquis but had psoas hematoma but since resolution of hematoma Dr. Clayborn Bigness placed her back on it . She also has pulmonary hypertension, we will make sure new CPAP supplies have been ordered    Hyperlipidemia: taking Atorvastatin daily and denies side effects. No chest pain , no muscles pain. Last LDL was at goal . Continue medication    Insomnia/OSA :Last Echo done by Dr. Clayborn Bigness showed mild pulmonary hypertension . Reminded her to resume CPAP , her machine is old, she had a new study but is still waiting for supplies  OA: multiple joints, sees Ortho, Dr. Rudene Christians . She had revision of right knee replacement March 2019 because of infection. Still under the care of Ortho now also seeing Dr. Ola Spurr - ID ( appointment end of Sep) . She is still on daily antibiotics - Amoxicillin daily    Left femoral nerve palsy:  seen by Dr. Rudene Christians, Dr. Manuella Ghazi, now seeing neurosurgeon at [facility] , Dr. Tamala Julian. She is able to walk short distances but s wearing brace because left knee is unstable due to femoral nerve palsy. She still  needs assistance bathing, cannot drive, cannot transfer alone . She was diagnosed with a hematoma of left iliopsoas during hospital  stay 10/2019 , but she had repeat  CT pelvis 01/09/2020 that showed resolution of hematoma. She is back on Eliquis and off aspirin now  Dr. Tamala Julian reviewed her case and advised against doing the surgery to decompress the nerve. She continues to have lower extremity edema that improves with massage She went to vascular surgeon and was advised to get the compression boots to wear for one hour twice a day and seems to have improved with swelling    CKI stage III:  currently on Farxiga , BP is low and not take ACE or ARB plus allergic to ACE   , no side effects of medication, had labs done by hematologist and GFR 45    Atherosclerosis of Aorta/Afib/PAD: she is on statin and back on Eliquis Denies side effects Last LDL at goal . Rate controlled . She is compliant with visits to cardiologist - Dr. Clayborn Bigness   Hypothyroidism: on levothyroxine and we will recheck TSH today, last level was over 12,currently at 50 mcg daily and we will recheck levels. She continues to have some hair loss, she has also lost 27 lbs since pounds since last visit. We will recheck level   Anxiety/dysthymia : she is on lexapro, leaving the house more often , making a point of sitting outside in the sun. She states not panic attacks. . She also goes on rides on  her wheelchair while her sister walks   Malnutrition: she has lost 27 lbs in the past few months, likely multifactorial, we will monitor, advised not to skip meals.   Patient Active Problem List   Diagnosis Date Noted   Anemia due to chronic kidney disease 08/26/2022   Hypothyroidism (acquired) 01/27/2022   PAD (peripheral artery disease) ([place]) 09/27/2021   AF (paroxysmal atrial fibrillation) ([place]) 09/15/2021   Primary osteoarthritis of right hip 07/18/2021   Atherosclerosis of aorta ([place]) 06/07/2021   Mild protein-calorie malnutrition ([place])    Hyperbilirubinemia    Injury of left femoral nerve 08/28/2020   Lymphedema 06/25/2020   History of pelvic hematoma 02/10/2020    Chronic venous hypertension due to deep vein thrombosis (DVT) 10/30/2019   Normocytic anemia 08/07/2019   Chronic infection of right knee ([place]) 11/25/2018   Infection and inflammatory reaction due to internal joint prosthesis, subsequent encounter 01/25/2018   Infection of total right knee replacement ([place]) 11/19/2017   Type 2 diabetes mellitus with stage 3b chronic kidney disease, without long-term current use of insulin ([place]) 11/06/2016   Primary osteoarthritis of knee 03/11/2016   Primary osteoarthritis of left hip 12/11/2015   Essential hypertension 04/21/2015   Chronic kidney disease (CKD), stage III (moderate) ([place]) 04/21/2015   Chronic venous insufficiency 04/21/2015   DDD (degenerative disc disease), cervical 04/21/2015   Diabetes mellitus with renal manifestation ([place]) 04/21/2015   Hyperlipidemia 04/21/2015   Bladder cystocele 04/21/2015   Urinary incontinence 04/21/2015   Leg varices 04/21/2015   Insomnia 04/21/2015   Eczema intertrigo 04/21/2015   Obesity, Class III, BMI 40-49.9 (morbid obesity) ([place]) 04/21/2015   OSA (obstructive sleep apnea) 04/21/2015   Osteoarthritis of multiple joints 04/21/2015   H/O Spinal surgery 11/14/2013   Det[place]or muscle hypertonia 11/07/2013    Past Surgical History:  Procedure Laterality Date   ABDOMINAL HYSTERECTOMY  1993   Total   BUNIONECTOMY Bilateral [place] Right 11/19/2017   Procedure: RIGHT KNEE POLY EXCHANGE WITH IRRIGATION AND DEBRIDEMENT;  Surgeon: Hessie Knows, MD;  Location: [facility];  Service: Orthopedics;  Laterality: Right;   IR FLUORO GUIDED NEEDLE PLC ASPIRATION/INJECTION LOC  09/19/2021   JOINT REPLACEMENT     bilateral knee   LAMINECTOMY  11/14/2013    Cervical Fusion , [facility], Dr. Delilah Shan   LASER ABLATION Bilateral 07/29/2012   Dr. Lucky Cowboy   LOWER EXTREMITY ANGIOGRAPHY Right 11/02/2019   Procedure: Lower Extremity Angiography;  Surgeon: Katha Cabal, MD;  Location: [place] [facility];   Service: Cardiovascular;  Laterality: Right;   ORIF ANKLE FRACTURE Right 09/02/2019   Procedure: OPEN REDUCTION INTERNAL FIXATION (ORIF) ANKLE FRACTURE BIMALLEOLAR;  Surgeon: [name], DPM;  Location: [facility];  Service: Podiatry;  Laterality: Right;   PERIPHERAL VASCULAR THROMBECTOMY Right 03/02/2020   Procedure: PERIPHERAL VASCULAR THROMBECTOMY;  Surgeon: Algernon Huxley, MD;  Location: [facility];  Service: Cardiovascular;  Laterality: Right;   REPLACEMENT TOTAL KNEE Left 03/2010   Dr. Rudene Christians   TEE WITHOUT CARDIOVERSION N/A 09/19/2021   Procedure: TRANSESOPHAGEAL ECHOCARDIOGRAM (TEE);  Surgeon: Corey Skains, MD;  Location: [facility];  Service: Cardiovascular;  Laterality: N/A;   TOTAL HIP ARTHROPLASTY Left 12/11/2015   Procedure: TOTAL HIP ARTHROPLASTY ANTERIOR APPROACH;  Surgeon: Hessie Knows, MD;  Location: [facility];  Service: Orthopedics;  Laterality: Left;   TOTAL KNEE ARTHROPLASTY Right 03/11/2016   Procedure: TOTAL KNEE ARTHROPLASTY;  Surgeon: Hessie Knows, MD;  Location: [facility];  Service: Orthopedics;  Laterality: Right;    Family History  Problem Relation Age of Onset   Diabetes Mother    Hyperlipidemia Mother    Hypertension Mother    Diabetes Father    Hyperlipidemia Father    Hypertension Father    Obesity Father    Hypertension Sister    Hyperlipidemia Sister    Hyperlipidemia Sister    Breast cancer Neg Hx     Social History   Tobacco Use   Smoking status: Never   Smokeless tobacco: Never  Substance Use Topics   Alcohol use: No    Alcohol/week: 0.0 standard drinks of alcohol     Current Outpatient Medications:    ACCU-CHEK GUIDE test strip, TEST ONCE A DAY AS DIRECTED, Disp: 50 strip, Rfl: 1   acetaminophen (TYLENOL) 650 MG CR tablet, Take 650-1,300 mg by mouth 2 (two) times daily as needed for pain., Disp: , Rfl:    amiodarone (PACERONE) 200 MG tablet, Take 200 mg by mouth daily., Disp: , Rfl:    apixaban (ELIQUIS) 5 MG TABS tablet, Take 1  tablet by mouth 2 (two) times daily., Disp: , Rfl:    atorvastatin (LIPITOR) 40 MG tablet, Take 1 tablet (40 mg total) by mouth every evening., Disp: 90 tablet, Rfl: 1   blood glucose meter kit and supplies, 1 each by Other route daily at 12 noon. Dispense based on patient and insurance preference. Check fsbs once a day, Disp: 1 each, Rfl: 0   Calcium Carbonate-Vitamin D 600-400 MG-UNIT tablet, Take 1 tablet by mouth 2 (two) times daily., Disp: 60 tablet, Rfl: 0   dapagliflozin propanediol (FARXIGA) 10 MG TABS tablet, Take 1 tablet (10 mg total) by mouth daily before breakfast. Patient receives via [employer] Patient Assistance, Disp: 90 tablet, Rfl: 3   escitalopram (LEXAPRO) 5 MG tablet, Take 1 tablet (5 mg total) by mouth daily., Disp: 90 tablet, Rfl: 1   ferrous sulfate 325 (65 FE) MG EC tablet, Take 1 tablet (325 mg total) by mouth daily., Disp: 30 tablet, Rfl: 4   fluticasone (FLONASE) 50 MCG/ACT nasal spray, SPRAY 2 SPRAYS INTO EACH NOSTRIL EVERY DAY, Disp: 48 mL, Rfl: 0   hydroquinone 4 % cream, Apply topically at bedtime., Disp: , Rfl:    latanoprost (XALATAN) 0.005 % ophthalmic solution, SMARTSIG:In Eye(s), Disp: , Rfl:    levothyroxine (SYNTHROID) 50 MCG tablet, TAKE 1 TABLET BY MOUTH EVERY DAY, Disp: 90 tablet, Rfl: 0   Magnesium Oxide 500 MG CAPS, Take 500 mg by mouth daily. , Disp: , Rfl:    metoprolol succinate (TOPROL-XL) 50 MG 24 hr tablet, Take 50 mg by mouth daily., Disp: , Rfl:    Oxycodone HCl 10 MG TABS, Take 1 tablet by mouth 4 (four) times daily as needed., Disp: , Rfl:    polyethylene glycol (MIRALAX / GLYCOLAX) 17 g packet, Take 17 g by mouth daily. (Patient taking differently: Take 17 g by mouth every other day.), Disp: 14 each, Rfl: 0   potassium chloride (KLOR-CON) 10 MEQ tablet, Take 10 mEq by mouth daily., Disp: , Rfl:    SIMBRINZA 1-0.2 % SUSP, Place 1 drop into both eyes 2 (two) times daily., Disp: , Rfl:    sodium chloride (OCEAN) 0.65 % SOLN nasal spray, Place 1 spray  into both nostrils 3 (three) times daily., Disp: 30 mL, Rfl: 0   torsemide (DEMADEX) 20 MG tablet, TAKE 1 TABLET BY MOUTH EVERY DAY, Disp: 90 tablet, Rfl: 1   traZODone (DESYREL)  50 MG tablet, Take 0.5-1 tablets (25-50 mg total) by mouth at bedtime as needed. for sleep, Disp: 90 tablet, Rfl: 0   Vibegron (GEMTESA) 75 MG TABS, Take 75 mg by mouth daily., Disp: 90 tablet, Rfl: 3   amoxicillin (AMOXIL) 500 MG capsule, Take 1 capsule (500 mg total) by mouth 2 (two) times daily. (Patient not taking: Reported on 08/29/2022), Disp: 60 capsule, Rfl: 12  Allergies  Allergen Reactions   Lisinopril Swelling    I personally reviewed active problem list, medication list, allergies, family history, social history, health maintenance with the patient/caregiver today.   ROS  Ten systems reviewed and is negative except as mentioned in HPI   Objective  Vitals:   08/29/22 1449  BP: 118/62  Pulse: 60  Resp: 14  Temp: 98.1 F (36.7 C)  TempSrc: Oral  SpO2: 100%  Weight: (!) 321 lb 14.4 oz (146 kg)  Height: _0  (1.803 m)    Body mass index is 44.9 kg/m.  Physical Exam  Constitutional: Patient appears well-developed . Obese but has some temporal waisting   No distress.  HEENT: head atraumatic, normocephalic,  neck supple Cardiovascular: Normal rate, regular rhythm and normal heart sounds.  No murmur heard. Lower legs wrapped with a flexible boot, right thigh larger than left due to chronic DVT Pulmonary/Chest: Effort normal and breath sounds normal. No respiratory distress. Abdominal: Soft.  There is no tenderness. Psychiatric: Patient has a normal mood and affect. behavior is normal. Judgment and thought content normal.    PHQ2/9:    08/29/2022    2:54 PM 08/05/2022   10:25 AM 06/25/2022    8:41 AM 06/19/2022   10:20 AM 05/30/2022    9:39 AM  Depression screen PHQ 2/9  Decreased Interest 0 0 0 0 0  Down, Depressed, Hopeless 0 0 0 0 0  PHQ - 2 Score 0 0 0 0 0  Altered sleeping 0   0 0 0  Tired, decreased energy 0  0 0 0  Change in appetite 0  0 0 0  Feeling bad or failure about yourself  0  0 0 0  Trouble concentrating 0  0 0 0  Moving slowly or fidgety/restless 0  0 0 0  Suicidal thoughts 0  0 0 0  PHQ-9 Score 0  0 0 0    phq 9 is negative   Fall Risk:    08/29/2022    2:54 PM 08/05/2022   10:26 AM 06/25/2022    8:41 AM 06/19/2022   10:20 AM 05/30/2022    9:39 AM  Fall Risk   Falls in the past year? 0 0 0 0 0  Number falls in past yr:  0   0  Injury with Fall?  0   0  Risk for fall due to : No Fall Risks No Fall Risks Impaired balance/gait;Impaired mobility No Fall Risks Impaired balance/gait;Impaired mobility  Follow up Falls prevention discussed;Education provided;Falls evaluation completed Falls evaluation completed Falls prevention discussed;Education provided Falls evaluation completed Falls prevention discussed      Functional Status Survey: Is the patient deaf or have difficulty hearing?: No Does the patient have difficulty seeing, even when wearing glasses/contacts?: Yes Does the patient have difficulty concentrating, remembering, or making decisions?: No Does the patient have difficulty walking or climbing stairs?: Yes Does the patient have difficulty dressing or bathing?: No Does the patient have difficulty doing errands alone such as visiting a doctor's office or shopping?: Yes    Assessment &  Plan  1. Type 2 diabetes mellitus with diabetic neuropathy, without long-term current use of insulin (HCC)  - POCT HgB A1C  2. PAD (peripheral artery disease) (HCC)  On eliquis and statin therapy  3. Stage 3b chronic kidney disease ([place])  Continue Farxiga  4. Atherosclerosis of aorta ([place])  On statin therapy   5. Pulmonary hypertension ([place])  Needs to resume cpap  6. AF (paroxysmal atrial fibrillation) (HCC)  Rate controlled  7. Morbid obesity ([place])  She has lost a significant amount of weight in the past 3 months  8.  Chronic venous hypertension due to deep vein thrombosis (DVT)  On Eliquis  9. Chronic infection of right knee (HCC)  Under the care of ID , on chronic amoxicillin   10. Mild protein-calorie malnutrition ([place])  Discussed protein intake  11. B12 deficiency  Continue supplementation   12. Primary osteoarthritis of both knees   13. Hypothyroidism (acquired)  - TSH  14. Dyslipidemia   15. Essential hypertension   16. 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 OSA (obstructive sleep apnea)   She is not sure where she did sleep study

## 2022-08-29 ENCOUNTER — Encounter: Payer: Self-pay | Admitting: Family Medicine

## 2022-08-29 ENCOUNTER — Ambulatory Visit (INDEPENDENT_AMBULATORY_CARE_PROVIDER_SITE_OTHER): Payer: Medicare Other | Admitting: Family Medicine

## 2022-08-29 VITALS — BP 118/62 | HR 60 | Temp 98.1°F | Resp 14 | Ht 71.0 in | Wt 321.9 lb

## 2022-08-29 DIAGNOSIS — F410 Panic disorder [episodic paroxysmal anxiety] without agoraphobia: Secondary | ICD-10-CM

## 2022-08-29 DIAGNOSIS — E785 Hyperlipidemia, unspecified: Secondary | ICD-10-CM

## 2022-08-29 DIAGNOSIS — I10 Essential (primary) hypertension: Secondary | ICD-10-CM

## 2022-08-29 DIAGNOSIS — I700 Atherosclerosis of aorta: Secondary | ICD-10-CM

## 2022-08-29 DIAGNOSIS — M009 Pyogenic arthritis, unspecified: Secondary | ICD-10-CM

## 2022-08-29 DIAGNOSIS — I87099 Postthrombotic syndrome with other complications of unspecified lower extremity: Secondary | ICD-10-CM

## 2022-08-29 DIAGNOSIS — E039 Hypothyroidism, unspecified: Secondary | ICD-10-CM

## 2022-08-29 DIAGNOSIS — E1140 Type 2 diabetes mellitus with diabetic neuropathy, unspecified: Secondary | ICD-10-CM

## 2022-08-29 DIAGNOSIS — I480 Paroxysmal atrial fibrillation: Secondary | ICD-10-CM

## 2022-08-29 DIAGNOSIS — I272 Pulmonary hypertension, unspecified: Secondary | ICD-10-CM

## 2022-08-29 DIAGNOSIS — I739 Peripheral vascular disease, unspecified: Secondary | ICD-10-CM | POA: Diagnosis not present

## 2022-08-29 DIAGNOSIS — G4733 Obstructive sleep apnea (adult) (pediatric): Secondary | ICD-10-CM

## 2022-08-29 DIAGNOSIS — N1832 Chronic kidney disease, stage 3b: Secondary | ICD-10-CM | POA: Diagnosis not present

## 2022-08-29 DIAGNOSIS — E441 Mild protein-calorie malnutrition: Secondary | ICD-10-CM

## 2022-08-29 DIAGNOSIS — E538 Deficiency of other specified B group vitamins: Secondary | ICD-10-CM

## 2022-08-29 DIAGNOSIS — F064 Anxiety disorder due to known physiological condition: Secondary | ICD-10-CM

## 2022-08-29 DIAGNOSIS — M170 Bilateral primary osteoarthritis of knee: Secondary | ICD-10-CM

## 2022-08-29 LAB — POCT GLYCOSYLATED HEMOGLOBIN (HGB A1C): Hemoglobin A1C: 5.5 % (ref 4.0–5.6)

## 2022-08-30 LAB — TSH: TSH: 3.52 m[IU]/L (ref 0.40–4.50)

## 2022-09-02 ENCOUNTER — Ambulatory Visit: Payer: Self-pay

## 2022-09-02 NOTE — Patient Instructions (Signed)
Visit Information  Thank you for taking time to visit with me today. Please don't hesitate to contact me if I can be of assistance to you.   Following are the goals we discussed today:   Goals Addressed             This Visit's Progress    COMPLETED: Care Coordination Activities       Care Coordination Interventions: SDoH screening performed - identified challenges with financial strain Noted patient referral to SW indicated the patient was having difficulty affording her rent - patient reports she was able to pay it and is not currently behind in her rent Determined the patient and her spouse's income is approximately $3,300 per month which puts them over the income limit for most assistance programs. Patients current rent is $1500 per month Discussed opportunity to enroll in meals on wheels and/or visit food pantries to save on costs with groceries - patient declined at this time stating she would like to speak with her daughter Education provided that although there are agencies that can sometimes assist with rent they require an eviction notice and it is not a guarantee funds will be available which is why most patients look at other opportunities for cost savings such as visiting a food pantry to offset expenses Reviewed the patient uses [employer] for transportation and therefore does not have to worry about a car payment, car insurance, or cost of gas. Patient reports she is also working to get her cell phone bill lowered Encouraged the patient to contact SW if she desires a referral to resources as outlined above         If you are experiencing a Mental Health or [place] or need someone to talk to, please call 1-800-273-TALK (toll free, 24 hour hotline) call 911  Patient verbalizes understanding of instructions and care plan provided today and agrees to view in [place]. Active MyChart status and patient understanding of how to access instructions and care plan via  MyChart confirmed with patient.     No further follow up required: Please contact your primary care provider as needed  Daneen Schick, Arita Miss, CDP Social Worker, Certified Dementia Practitioner [place] [facility] [phone]

## 2022-09-02 NOTE — Patient Outreach (Signed)
  Care Coordination   Initial Visit Note   09/02/2022 Name: Becky Trevino MRN: [id number] DOB: 12/23/1945  Becky Trevino is a 76 y.o. year old female who sees [name], MD for primary care. I spoke with  Becky Trevino by phone today.  What matters to the patients health and wellness today?  Resources to help with financial strain related to the cost of rent    Goals Addressed             This Visit's Progress    COMPLETED: Care Coordination Activities       Care Coordination Interventions: SDoH screening performed - identified challenges with financial strain Noted patient referral to SW indicated the patient was having difficulty affording her rent - patient reports she was able to pay it and is not currently behind in her rent Determined the patient and her spouse's income is approximately $3,300 per month which puts them over the income limit for most assistance programs. Patients current rent is $1500 per month Discussed opportunity to enroll in meals on wheels and/or visit food pantries to save on costs with groceries - patient declined at this time stating she would like to speak with her daughter Education provided that although there are agencies that can sometimes assist with rent they require an eviction notice and it is not a guarantee funds will be available which is why most patients look at other opportunities for cost savings such as visiting a food pantry to offset expenses Reviewed the patient uses [employer] for transportation and therefore does not have to worry about a car payment, car insurance, or cost of gas. Patient reports she is also working to get her cell phone bill lowered Encouraged the patient to contact SW if she desires a referral to resources as outlined above         SDOH assessments and interventions completed:  Yes  SDOH Interventions Today    Flowsheet Row Most Recent Value  SDOH Interventions   Food Insecurity Interventions  Intervention Not Indicated  Housing Interventions Other (Comment)  [pt expresses difficulty covering rent each month but is able to cover costs at this time]  Transportation Interventions Intervention Not Indicated  [uses [employer]]  Utilities Interventions Intervention Not Indicated        Care Coordination Interventions:  Yes, provided   Follow up plan: No further intervention required.   Encounter Outcome:  Pt. Visit Completed   Daneen Schick, BSW, CDP Social Worker, Certified Dementia Practitioner [facility]  [facility] Coordination [phone]

## 2022-09-03 ENCOUNTER — Ambulatory Visit: Payer: Medicare Other | Admitting: Family Medicine

## 2022-09-22 DIAGNOSIS — I480 Paroxysmal atrial fibrillation: Secondary | ICD-10-CM | POA: Diagnosis not present

## 2022-09-22 DIAGNOSIS — I890 Lymphedema, not elsewhere classified: Secondary | ICD-10-CM | POA: Diagnosis not present

## 2022-09-22 DIAGNOSIS — G4730 Sleep apnea, unspecified: Secondary | ICD-10-CM | POA: Diagnosis not present

## 2022-09-22 DIAGNOSIS — I872 Venous insufficiency (chronic) (peripheral): Secondary | ICD-10-CM | POA: Diagnosis not present

## 2022-09-24 DIAGNOSIS — E1142 Type 2 diabetes mellitus with diabetic polyneuropathy: Secondary | ICD-10-CM | POA: Diagnosis not present

## 2022-09-24 DIAGNOSIS — B351 Tinea unguium: Secondary | ICD-10-CM | POA: Diagnosis not present

## 2022-09-26 ENCOUNTER — Ambulatory Visit (INDEPENDENT_AMBULATORY_CARE_PROVIDER_SITE_OTHER): Payer: Medicare Other | Admitting: Vascular Surgery

## 2022-09-29 ENCOUNTER — Encounter (INDEPENDENT_AMBULATORY_CARE_PROVIDER_SITE_OTHER): Payer: Medicare Other

## 2022-09-29 ENCOUNTER — Ambulatory Visit (INDEPENDENT_AMBULATORY_CARE_PROVIDER_SITE_OTHER): Payer: Medicare Other | Admitting: Vascular Surgery

## 2022-10-06 ENCOUNTER — Ambulatory Visit: Payer: Medicare Other | Admitting: Family Medicine

## 2022-10-06 ENCOUNTER — Telehealth: Payer: Self-pay | Admitting: Urology

## 2022-10-06 NOTE — Telephone Encounter (Signed)
Patient called and wanted to know if there is any medication that is less expensive than Myrbetriq or Gemtesa that can be prescribed. Please advise.

## 2022-10-07 DIAGNOSIS — H40153 Residual stage of open-angle glaucoma, bilateral: Secondary | ICD-10-CM | POA: Diagnosis not present

## 2022-10-07 MED ORDER — OXYBUTYNIN CHLORIDE ER 10 MG PO TB24: 10.0000 mg | ORAL_TABLET | Freq: Every day | ORAL | 6 refills | Status: DC

## 2022-10-07 NOTE — Telephone Encounter (Signed)
Nori Riis, PA-C  You12 hours ago (8:46 PM)    I think the only medication her insurance will cover is Oxybutynin XL 10 mg daily.   LMOM for patient to return call.

## 2022-10-08 ENCOUNTER — Other Ambulatory Visit (INDEPENDENT_AMBULATORY_CARE_PROVIDER_SITE_OTHER): Payer: Self-pay | Admitting: Vascular Surgery

## 2022-10-08 ENCOUNTER — Telehealth: Payer: Medicare Other

## 2022-10-08 DIAGNOSIS — Z95828 Presence of other vascular implants and grafts: Secondary | ICD-10-CM

## 2022-10-08 DIAGNOSIS — I872 Venous insufficiency (chronic) (peripheral): Secondary | ICD-10-CM

## 2022-10-08 DIAGNOSIS — I87099 Postthrombotic syndrome with other complications of unspecified lower extremity: Secondary | ICD-10-CM

## 2022-10-08 DIAGNOSIS — I739 Peripheral vascular disease, unspecified: Secondary | ICD-10-CM

## 2022-10-10 ENCOUNTER — Other Ambulatory Visit: Payer: Self-pay | Admitting: Family Medicine

## 2022-10-10 DIAGNOSIS — E1140 Type 2 diabetes mellitus with diabetic neuropathy, unspecified: Secondary | ICD-10-CM

## 2022-10-10 DIAGNOSIS — N1832 Chronic kidney disease, stage 3b: Secondary | ICD-10-CM

## 2022-10-10 MED ORDER — DAPAGLIFLOZIN PROPANEDIOL 10 MG PO TABS: 10.0000 mg | ORAL_TABLET | Freq: Every day | ORAL | 3 refills | Status: DC

## 2022-10-12 NOTE — Progress Notes (Unsigned)
MRN : [id number]  Becky Trevino is a 77 y.o. (04-May-1946) female who presents with chief complaint of legs swell.  History of Present Illness:   The patient returns to the office for followup evaluation regarding leg swelling.  The swelling has persisted but with the lymph pump is under much, much better controlled. The pain associated with swelling is decreased. There have not been any interval development of a ulcerations or wounds.  The patient had been doing physical therapy but had not been walking for many months. The patient notes that her leg swelling and lymphedema worsen. She is very diligent about wearing her compression wraps using a Velcro compression system. This does well for the swelling below her knees. However she has developed swelling in the medial thighs bilaterally. The left tends to be worse than the right. Patient denies any open wounds or ulcerations. She denies any fevers or chills.   The patient denies problems with the pump, noting it is working well and the leggings are in good condition.  Since the previous visit the patient has been wearing graduated compression stockings and using the lymph pump on a routine basis and  has noted significant improvement in the lymphedema.   Patient stated the lymph pump has been helpful with the treatment of the lymphedema.   No outpatient medications have been marked as taking for the 10/13/22 encounter (Appointment) with [name], Dolores Lory, MD.    Past Medical History:  Diagnosis Date   Cervical spondylosis    Chronic kidney disease    Stage 3   DDD (degenerative disc disease), cervical    [facility]   Diabetes mellitus without complication ([place])    Diverticulosis    Hyperlipidemia    Hypertension    Intertrigo    Leg cramps    Leg varices    Obesity    OSA (obstructive sleep apnea)    Symptomatic menopausal or female climacteric states    Syncope     Past Surgical History:  Procedure Laterality  Date   ABDOMINAL HYSTERECTOMY  1993   Total   BUNIONECTOMY Bilateral 1993   I & D KNEE WITH POLY EXCHANGE Right 11/19/2017   Procedure: RIGHT KNEE POLY EXCHANGE WITH IRRIGATION AND DEBRIDEMENT;  Surgeon: Hessie Knows, MD;  Location: [facility];  Service: Orthopedics;  Laterality: Right;   IR FLUORO GUIDED NEEDLE PLC ASPIRATION/INJECTION LOC  09/19/2021   JOINT REPLACEMENT     bilateral knee   LAMINECTOMY  11/14/2013    Cervical Fusion , [facility], Dr. Delilah Shan   LASER ABLATION Bilateral 07/29/2012   Dr. Lucky Cowboy   LOWER EXTREMITY ANGIOGRAPHY Right 11/02/2019   Procedure: Lower Extremity Angiography;  Surgeon: Katha Cabal, MD;  Location: [place];  Service: Cardiovascular;  Laterality: Right;   ORIF ANKLE FRACTURE Right 09/02/2019   Procedure: OPEN REDUCTION INTERNAL FIXATION (ORIF) ANKLE FRACTURE BIMALLEOLAR;  Surgeon: [name], DPM;  Location: [facility];  Service: Podiatry;  Laterality: Right;   PERIPHERAL VASCULAR THROMBECTOMY Right 03/02/2020   Procedure: PERIPHERAL VASCULAR THROMBECTOMY;  Surgeon: Algernon Huxley, MD;  Location: [facility];  Service: Cardiovascular;  Laterality: Right;   REPLACEMENT TOTAL KNEE Left 03/2010   Dr. Rudene Christians   TEE WITHOUT CARDIOVERSION N/A 09/19/2021   Procedure: TRANSESOPHAGEAL ECHOCARDIOGRAM (TEE);  Surgeon: Corey Skains, MD;  Location: [facility];  Service: Cardiovascular;  Laterality: N/A;   TOTAL HIP ARTHROPLASTY Left 12/11/2015   Procedure: TOTAL HIP ARTHROPLASTY ANTERIOR APPROACH;  Surgeon: Hessie Knows, MD;  Location: [facility];  Service: Orthopedics;  Laterality: Left;   TOTAL KNEE ARTHROPLASTY Right 03/11/2016   Procedure: TOTAL KNEE ARTHROPLASTY;  Surgeon: Hessie Knows, MD;  Location: [facility];  Service: Orthopedics;  Laterality: Right;    Social History Social History   Tobacco Use   Smoking status: Never   Smokeless tobacco: Never  Vaping Use   Vaping Use: Never used  Substance Use Topics   Alcohol use: No    Alcohol/week: 0.0  standard drinks of alcohol   Drug use: No    Family History Family History  Problem Relation Age of Onset   Diabetes Mother    Hyperlipidemia Mother    Hypertension Mother    Diabetes Father    Hyperlipidemia Father    Hypertension Father    Obesity Father    Hypertension Sister    Hyperlipidemia Sister    Hyperlipidemia Sister    Breast cancer Neg Hx     Allergies  Allergen Reactions   Lisinopril Swelling     REVIEW OF SYSTEMS (Negative unless checked)  Constitutional: '[]'$ Weight loss  '[]'$ Fever  '[]'$ Chills Cardiac: '[]'$ Chest pain   '[]'$ Chest pressure   '[]'$ Palpitations   '[]'$ Shortness of breath when laying flat   '[]'$ Shortness of breath with exertion. Vascular:  '[]'$ Pain in legs with walking   '[x]'$ Pain in legs with standing  '[]'$ History of DVT   '[]'$ Phlebitis   '[x]'$ Swelling in legs   '[]'$ Varicose veins   '[]'$ Non-healing ulcers Pulmonary:   '[]'$ Uses home oxygen   '[]'$ Productive cough   '[]'$ Hemoptysis   '[]'$ Wheeze  '[]'$ COPD   '[]'$ Asthma Neurologic:  '[]'$ Dizziness   '[]'$ Seizures   '[]'$ History of stroke   '[]'$ History of TIA  '[]'$ Aphasia   '[]'$ Vissual changes   '[]'$ Weakness or numbness in arm   '[]'$ Weakness or numbness in leg Musculoskeletal:   '[]'$ Joint swelling   '[]'$ Joint pain   '[]'$ Low back pain Hematologic:  '[]'$ Easy bruising  '[]'$ Easy bleeding   '[]'$ Hypercoagulable state   '[]'$ Anemic Gastrointestinal:  '[]'$ Diarrhea   '[]'$ Vomiting  '[]'$ Gastroesophageal reflux/heartburn   '[]'$ Difficulty swallowing. Genitourinary:  '[]'$ Chronic kidney disease   '[]'$ Difficult urination  '[]'$ Frequent urination   '[]'$ Blood in urine Skin:  '[]'$ Rashes   '[]'$ Ulcers  Psychological:  '[]'$ History of anxiety   '[]'$  History of major depression.  Physical Examination  There were no vitals filed for this visit. There is no height or weight on file to calculate BMI. Gen: WD/WN, NAD Head: [place]/AT, No temporalis wasting.  Ear/Nose/Throat: Hearing grossly intact, nares w/o erythema or drainage, pinna without lesions Eyes: PER, EOMI, sclera nonicteric.  Neck: Supple, no gross masses.  No JVD.   Pulmonary:  Good air movement, no audible wheezing, no use of accessory muscles.  Cardiac: RRR, precordium not hyperdynamic. Vascular:  scattered varicosities present bilaterally.  Mild venous stasis changes to the legs bilaterally.  3-4+ soft pitting edema, CEAP [id number]  Vessel Right Left  Radial Palpable Palpable  Gastrointestinal: soft, non-distended. No guarding/no peritoneal signs.  Musculoskeletal: M/S 5/5 throughout.  No deformity.  Neurologic: CN 2-12 intact. Pain and light touch intact in extremities.  Symmetrical.  Speech is fluent. Motor exam as listed above. Psychiatric: Judgment intact, Mood & affect appropriate for pt's clinical situation. Dermatologic: Venous rashes no ulcers noted.  No changes consistent with cellulitis. Lymph : No lichenification or skin changes of chronic lymphedema.  CBC Lab Results  Component Value Date   WBC 4.9 08/26/2022   HGB 10.8 (L) 08/26/2022   HCT 35.8 (L) 08/26/2022   MCV 88.4 08/26/2022  PLT 243 08/26/2022    BMET    Component Value Date/Time   NA 141 08/26/2022 1040   NA 142 11/26/2015 1122   NA 141 10/18/2012 1716   K 4.2 08/26/2022 1040   K 3.5 10/18/2012 1716   CL 106 08/26/2022 1040   CL 110 (H) 10/18/2012 1716   CO2 29 08/26/2022 1040   CO2 24 10/18/2012 1716   GLUCOSE 92 08/26/2022 1040   GLUCOSE 128 (H) 10/18/2012 1716   BUN 21 08/26/2022 1040   BUN 27 11/26/2015 1122   BUN 31 (H) 10/18/2012 1716   CREATININE 1.24 (H) 08/26/2022 1040   CREATININE 1.33 (H) 05/30/2022 1117   CALCIUM 8.5 (L) 08/26/2022 1040   CALCIUM 7.9 (L) 10/18/2012 1716   GFRNONAA 45 (L) 08/26/2022 1040   GFRNONAA 41 (L) 02/04/2021 1123   GFRAA 48 (L) 02/04/2021 1123   CrCl cannot be calculated (Patient's most recent lab result is older than the maximum 21 days allowed.).  COAG Lab Results  Component Value Date   INR 1.8 (H) 09/19/2021   INR 1.2 07/15/2021   INR 1.1 05/01/2020    Radiology No results found.   Assessment/Plan 1.  Lymphedema No surgery or intervention at this point in time.     Myrissa Chipley has had a longstanding history of Persistent, chronic and severe lower extremity lymphedema extending into the abdomen and groin area.  The severity is identified by continue to medial thigh edema with skin discoloration and had skin breakdown.For the past 4 weeks, the patient has tried various treatment options with no significant improvement including: Compliant daily wear of 30 mmHg distally compression wraps, regular daily elevation and exercise, along with multiple daily in-home sessions with the [id number] lymphedema pump.  In addition, all appropriate medications and dietary evaluations and changes have been implemented.  Recommendations of the ED [phone] calibrated programmable pump at appropriate garments to better treat the abdomen and groin area.  Her recent abdominal measurements are 158 cm.  2. Mixed hyperlipidemia ***  3. Primary osteoarthritis involving multiple joints ***  4. Essential hypertension ***    Hortencia Pilar, MD  10/12/2022 9:39 AM

## 2022-10-13 ENCOUNTER — Ambulatory Visit (INDEPENDENT_AMBULATORY_CARE_PROVIDER_SITE_OTHER): Payer: Medicare Other | Admitting: Vascular Surgery

## 2022-10-13 ENCOUNTER — Ambulatory Visit (INDEPENDENT_AMBULATORY_CARE_PROVIDER_SITE_OTHER): Payer: Medicare Other

## 2022-10-13 ENCOUNTER — Encounter (INDEPENDENT_AMBULATORY_CARE_PROVIDER_SITE_OTHER): Payer: Self-pay | Admitting: Vascular Surgery

## 2022-10-13 VITALS — BP 121/61 | HR 53 | Ht 71.0 in | Wt 324.0 lb

## 2022-10-13 DIAGNOSIS — I87099 Postthrombotic syndrome with other complications of unspecified lower extremity: Secondary | ICD-10-CM | POA: Diagnosis not present

## 2022-10-13 DIAGNOSIS — E782 Mixed hyperlipidemia: Secondary | ICD-10-CM | POA: Diagnosis not present

## 2022-10-13 DIAGNOSIS — I890 Lymphedema, not elsewhere classified: Secondary | ICD-10-CM

## 2022-10-13 DIAGNOSIS — I70213 Atherosclerosis of native arteries of extremities with intermittent claudication, bilateral legs: Secondary | ICD-10-CM

## 2022-10-13 DIAGNOSIS — M159 Polyosteoarthritis, unspecified: Secondary | ICD-10-CM | POA: Diagnosis not present

## 2022-10-13 DIAGNOSIS — Z95828 Presence of other vascular implants and grafts: Secondary | ICD-10-CM | POA: Diagnosis not present

## 2022-10-13 DIAGNOSIS — I872 Venous insufficiency (chronic) (peripheral): Secondary | ICD-10-CM

## 2022-10-13 DIAGNOSIS — I739 Peripheral vascular disease, unspecified: Secondary | ICD-10-CM

## 2022-10-13 DIAGNOSIS — I10 Essential (primary) hypertension: Secondary | ICD-10-CM | POA: Diagnosis not present

## 2022-10-16 ENCOUNTER — Encounter (INDEPENDENT_AMBULATORY_CARE_PROVIDER_SITE_OTHER): Payer: Self-pay | Admitting: Vascular Surgery

## 2022-10-16 DIAGNOSIS — I70219 Atherosclerosis of native arteries of extremities with intermittent claudication, unspecified extremity: Secondary | ICD-10-CM | POA: Insufficient documentation

## 2022-10-25 ENCOUNTER — Other Ambulatory Visit: Payer: Self-pay | Admitting: Family Medicine

## 2022-10-27 ENCOUNTER — Other Ambulatory Visit: Payer: Self-pay

## 2022-10-27 MED ORDER — LEVOTHYROXINE SODIUM 50 MCG PO TABS: 50.0000 ug | ORAL_TABLET | Freq: Every day | ORAL | 0 refills | Status: DC

## 2022-11-07 ENCOUNTER — Emergency Department
Admission: EM | Admit: 2022-11-07 | Discharge: 2022-11-07 | Disposition: A | Discharge status: Home / Self Care | Payer: Medicare Other | Attending: Emergency Medicine | Admitting: Emergency Medicine

## 2022-11-07 ENCOUNTER — Emergency Department: Payer: Medicare Other

## 2022-11-07 DIAGNOSIS — E1122 Type 2 diabetes mellitus with diabetic chronic kidney disease: Secondary | ICD-10-CM | POA: Diagnosis not present

## 2022-11-07 DIAGNOSIS — R5381 Other malaise: Secondary | ICD-10-CM | POA: Diagnosis not present

## 2022-11-07 DIAGNOSIS — R079 Chest pain, unspecified: Secondary | ICD-10-CM

## 2022-11-07 DIAGNOSIS — R0789 Other chest pain: Secondary | ICD-10-CM | POA: Insufficient documentation

## 2022-11-07 DIAGNOSIS — R69 Illness, unspecified: Secondary | ICD-10-CM | POA: Diagnosis not present

## 2022-11-07 DIAGNOSIS — I499 Cardiac arrhythmia, unspecified: Secondary | ICD-10-CM | POA: Diagnosis not present

## 2022-11-07 DIAGNOSIS — I129 Hypertensive chronic kidney disease with stage 1 through stage 4 chronic kidney disease, or unspecified chronic kidney disease: Secondary | ICD-10-CM | POA: Insufficient documentation

## 2022-11-07 DIAGNOSIS — R609 Edema, unspecified: Secondary | ICD-10-CM | POA: Diagnosis not present

## 2022-11-07 DIAGNOSIS — N189 Chronic kidney disease, unspecified: Secondary | ICD-10-CM | POA: Diagnosis not present

## 2022-11-07 DIAGNOSIS — Z743 Need for continuous supervision: Secondary | ICD-10-CM | POA: Diagnosis not present

## 2022-11-07 LAB — COMPREHENSIVE METABOLIC PANEL
ALT: 16 U/L (ref 0–44)
AST: 26 U/L (ref 15–41)
Albumin: 3.4 g/dL — ABNORMAL LOW (ref 3.5–5.0)
Alkaline Phosphatase: 94 U/L (ref 38–126)
Anion gap: 7 (ref 5–15)
BUN: 27 mg/dL — ABNORMAL HIGH (ref 8–23)
CO2: 31 mmol/L (ref 22–32)
Calcium: 8.8 mg/dL — ABNORMAL LOW (ref 8.9–10.3)
Chloride: 101 mmol/L (ref 98–111)
Creatinine, Ser: 1.46 mg/dL — ABNORMAL HIGH (ref 0.44–1.00)
GFR, Estimated: 37 mL/min — ABNORMAL LOW (ref >60)
Glucose, Bld: 104 mg/dL — ABNORMAL HIGH (ref 70–99)
Potassium: 4.1 mmol/L (ref 3.5–5.1)
Sodium: 139 mmol/L (ref 135–145)
Total Bilirubin: 0.4 mg/dL (ref 0.3–1.2)
Total Protein: 9 g/dL — ABNORMAL HIGH (ref 6.5–8.1)

## 2022-11-07 LAB — TROPONIN I (HIGH SENSITIVITY)
Troponin I (High Sensitivity): 6 ng/L (ref <18)
Troponin I (High Sensitivity): 7 ng/L (ref <18)

## 2022-11-07 LAB — CBC
HCT: 42.1 % (ref 36.0–46.0)
Hemoglobin: 12.6 g/dL (ref 12.0–15.0)
MCH: 27.9 pg (ref 26.0–34.0)
MCHC: 29.9 g/dL — ABNORMAL LOW (ref 30.0–36.0)
MCV: 93.3 fL (ref 80.0–100.0)
Platelets: 191 10*3/uL (ref 150–400)
RBC: 4.51 MIL/uL (ref 3.87–5.11)
RDW: 21.2 % — ABNORMAL HIGH (ref 11.5–15.5)
WBC: 4.5 10*3/uL (ref 4.0–10.5)
nRBC: 0 % (ref 0.0–0.2)

## 2022-11-07 NOTE — ED Triage Notes (Signed)
Pt presents to the ED via [facility] due to CP that started this morning. Per EMS patient took '324mg'$  ASA with no relief. Pt denies hx of CHF, MI and Stroke. Pt denies SOB. Pt does have a hx of Afib on eliquis and metoprolol. Pt A&Ox4

## 2022-11-07 NOTE — ED Notes (Signed)
Pt called out to use the restroom. Pt was placed on a purwick. Peri care was completed before pt was placed on the purwick.

## 2022-11-07 NOTE — ED Notes (Signed)
Called [facility] for transport to [address]   [phone]

## 2022-11-07 NOTE — ED Provider Notes (Signed)
[facility] Provider Note    Event Date/Time   First MD Initiated Contact with Patient 11/07/22 [phone]     (approximate)  History   Chief Complaint: No chief complaint on file.  HPI  Becky Trevino is a 77 y.o. female with a past medical history of CKD, diabetes, hypertension, hyperlipidemia, presents emergency department for chest pain.  According to the patient this morning around 9 AM while she was watching TV she developed a sharp pain in the center of her chest.  States the pain lasted just a couple seconds and then resolved.  Patient was concerned so she came to the emergency department for evaluation.  Patient denies any chest pain since that time.  Denies any shortness of breath.  No pleuritic pain.  No leg pain.  Patient denies any nausea diaphoresis or shortness of breath.  Currently appears well lying in bed comfortably watching TV.  Physical Exam   Triage Vital Signs: ED Triage Vitals  Enc Vitals Group     BP 11/07/22 0954 (!) 142/60     Pulse Rate 11/07/22 0954 (!) 55     Resp 11/07/22 0954 18     Temp 11/07/22 0954 97.6 F (36.4 C)     Temp Source 11/07/22 0954 Oral     SpO2 11/07/22 0954 100 %     Weight 11/07/22 0953 (!) 320 lb (145.2 kg)     Height 11/07/22 0953 '5\' 11"'$  (1.803 m)     Head Circumference --      Peak Flow --      Pain Score 11/07/22 0953 5     Pain Loc --      Pain Edu? --      Excl. in [place]? --     Most recent vital signs: Vitals:   11/07/22 0954  BP: (!) 142/60  Pulse: (!) 55  Resp: 18  Temp: 97.6 F (36.4 C)  SpO2: 100%    General: Awake, no distress.  CV:  Good peripheral perfusion.  Regular rate and rhythm  Resp:  Normal effort.  Equal breath sounds bilaterally.  Abd:  No distention.  Soft, nontender.  No rebound or guarding.  ED Results / Procedures / Treatments   EKG  EKG viewed and interpreted by myself shows a normal sinus rhythm at 54 bpm with a narrow QRS, normal axis, slight PR prolongation  otherwise normal intervals with no concerning ST changes.  RADIOLOGY  I have reviewed and interpreted the chest x-ray images.  No consolidation seen on my evaluation. Radiology is read the x-ray as negative   MEDICATIONS ORDERED IN ED: Medications - No data to display   IMPRESSION / MDM / [place] / ED COURSE  I reviewed the triage vital signs and the nursing notes.  Patient's presentation is most consistent with acute presentation with potential threat to life or bodily function.  Patient presents emergency department for chest pain lasting several seconds this morning then resolving.  Nonsense.  Overall the patient appears well, no distress.  No nausea diaphoresis or shortness of breath at any point.  No pleuritic pain.  Given the patient's symptoms we will check labs including cardiac enzymes EKG and a chest x-ray.  Patient will require 2 sets of cardiac enzymes given the onset just before arrival.  Patient agreeable to plan of care.  Differential would include ACS, gas type pains, esophageal pain or spasm, musculoskeletal or chest wall pain.  Patient's workup in the emergency department  is overall reassuring, normal CBC with a normal white blood cell count, reassuring chemistry with mild renal insufficiency.  Troponin is negative x 2.  Chest x-ray is clear and EKG reassuring.  Given the patient's reassuring workup reassuring labs are reassuring physical exam and vitals I believe the patient will be safe for discharge home with PCP follow-up.  Provided my typical chest pain return precautions.  Patient agreeable to plan of care.  FINAL CLINICAL IMPRESSION(S) / ED DIAGNOSES   Chest pain   Note:  This document was prepared using Dragon voice recognition software and may include unintentional dictation errors.   Harvest Dark, MD 11/07/22 1310

## 2022-11-21 DIAGNOSIS — M1611 Unilateral primary osteoarthritis, right hip: Secondary | ICD-10-CM | POA: Diagnosis not present

## 2022-11-21 DIAGNOSIS — M25361 Other instability, right knee: Secondary | ICD-10-CM | POA: Diagnosis not present

## 2022-11-21 DIAGNOSIS — M247 Protrusio acetabuli: Secondary | ICD-10-CM | POA: Diagnosis not present

## 2022-11-21 DIAGNOSIS — M7541 Impingement syndrome of right shoulder: Secondary | ICD-10-CM | POA: Diagnosis not present

## 2022-11-21 DIAGNOSIS — M1711 Unilateral primary osteoarthritis, right knee: Secondary | ICD-10-CM | POA: Diagnosis not present

## 2022-11-21 DIAGNOSIS — Z96651 Presence of right artificial knee joint: Secondary | ICD-10-CM | POA: Diagnosis not present

## 2022-11-21 DIAGNOSIS — M009 Pyogenic arthritis, unspecified: Secondary | ICD-10-CM | POA: Diagnosis not present

## 2022-11-21 DIAGNOSIS — L97511 Non-pressure chronic ulcer of other part of right foot limited to breakdown of skin: Secondary | ICD-10-CM | POA: Diagnosis not present

## 2022-11-27 DIAGNOSIS — M1990 Unspecified osteoarthritis, unspecified site: Secondary | ICD-10-CM | POA: Diagnosis not present

## 2022-11-27 DIAGNOSIS — I890 Lymphedema, not elsewhere classified: Secondary | ICD-10-CM | POA: Diagnosis not present

## 2022-11-27 DIAGNOSIS — N1831 Chronic kidney disease, stage 3a: Secondary | ICD-10-CM | POA: Diagnosis not present

## 2022-11-27 DIAGNOSIS — M5033 Other cervical disc degeneration, cervicothoracic region: Secondary | ICD-10-CM | POA: Diagnosis not present

## 2022-11-27 DIAGNOSIS — M21372 Foot drop, left foot: Secondary | ICD-10-CM | POA: Diagnosis not present

## 2022-11-28 NOTE — Progress Notes (Unsigned)
Name: Becky Trevino   MRN: [id number]    DOB: 1945/12/24   Date:12/01/2022       Progress Note  Subjective  Chief Complaint  Follow Up  HPI  DMII with renal manifestation and obesity:  she denies polyuria, polyphagia or polydipsia. A1Cis normal She has CKI - last GFR dropped a little while at [facility] down to 37 but she was also slightly dehydrated ( lab is closed now) She also has  dyslipidemia and obesity . She cannot take ACE due to angioedema, A1C is at goal, taking [place] for CKI . She states statins for dyslipidemia. She has neuropathy and she has follow up with podiatrist next month   Dysphagia: she has noticed that when she eats a burger or fries she feels like food gets stuck and when it happens it causes pain and she needs to through it up. No abdominal pain. She is losing weight. She prefers not having EGD at this time ( going under anesthesia) but willing to do a upper GI study Denies any dysphagia with liquids    Chronic DVT  right popliteal vein: diagnosed 10/27/2019,  she has an IV filter. Dr. Tasia Catchings had stopped Eliquis but had psoas hematoma but since resolution of hematoma Dr. Clayborn Bigness placed her back on it . She also has pulmonary hypertension but still does not have CPAP supplies    Hyperlipidemia: taking Atorvastatin daily and denies side effects. No chest pain , no muscles pain. Last LDL was at goal . Continue medication    Insomnia/OSA :Last Echo done by Dr. Clayborn Bigness showed mild pulmonary hypertension. She is trying to get a new CPAP machine   OA: multiple joints, sees Ortho, Dr. Rudene Christians . She had revision of right knee replacement March 2019 because of infection. Still under the care of Ortho now also seeing Dr. Jeanice Lim . She is still on daily antibiotics - Amoxicillin daily    Left femoral nerve palsy:  seen by Dr. Rudene Christians, Dr. Manuella Ghazi, now seeing neurosurgeon at [facility] , Dr. Tamala Julian. She is able to walk short distances but s wearing brace because left knee is unstable due to femoral  nerve palsy. She still  needs assistance bathing, cannot drive, cannot transfer alone . She was diagnosed with a hematoma of left iliopsoas during hospital stay 10/2019 , but she had repeat  CT pelvis 01/09/2020 that showed resolution of hematoma. She is back on Eliquis and off aspirin now  Dr. Tamala Julian reviewed her case and advised against doing the surgery to decompress the nerve. She continues to have lower extremity edema that improves with massage She went to vascular surgeon and she is wearing compression pumps twice daily . She will have a fitted brace on right knee to help with ambulation   CKI stage III: last GFR was down during recent [facility] visit, she was also dehydrated, we will recheck labs   Atherosclerosis of Aorta/Afib/PAD: she is on statin and back on Eliquis Denies side effects Last LDL at goal . Rate controlled . She is compliant with visits to cardiologist - Dr. Clayborn Bigness She does not have claudication symptoms since only walks a little inside the house   Hypothyroidism: on levothyroxine 50 mcg daily, last TSH at goal, she states her hair is thinning out. Skin is good, no change in bowel movements  Anxiety/dysthymia : she is on lexapro, making a point of sitting outside in the sun. She states not panic attacks. . She also goes on rides on her wheelchair while her  sister walks She is doing well   Malnutrition: we checked her weight standing up ( no on her weight chair) weight is down from last year, over 40 lbs, but last albumin was better during recent visit to [facility]. We will monitor for now. She was getting her weight on a wheelchair in the past and not sure if accurate   Patient Active Problem List   Diagnosis Date Noted   Atherosclerosis of native arteries of extremity with intermittent claudication ([place]) 10/16/2022   Anemia due to chronic kidney disease 08/26/2022   Hypothyroidism (acquired) 01/27/2022   PAD (peripheral artery disease) ([place]) 09/27/2021   AF (paroxysmal atrial  fibrillation) ([place]) 09/15/2021   Primary osteoarthritis of right hip 07/18/2021   Atherosclerosis of aorta ([place]) 06/07/2021   Mild protein-calorie malnutrition ([place])    Hyperbilirubinemia    Injury of left femoral nerve 08/28/2020   Lymphedema 06/25/2020   History of pelvic hematoma 02/10/2020   Chronic venous hypertension due to deep vein thrombosis (DVT) 10/30/2019   Normocytic anemia 08/07/2019   Chronic infection of right knee ([place]) 11/25/2018   Infection and inflammatory reaction due to internal joint prosthesis, subsequent encounter 01/25/2018   Infection of total right knee replacement ([place]) 11/19/2017   Type 2 diabetes mellitus with stage 3b chronic kidney disease, without long-term current use of insulin ([place]) 11/06/2016   Primary osteoarthritis of knee 03/11/2016   Primary osteoarthritis of left hip 12/11/2015   Essential hypertension 04/21/2015   Chronic kidney disease (CKD), stage III (moderate) ([place]) 04/21/2015   Chronic venous insufficiency 04/21/2015   DDD (degenerative disc disease), cervical 04/21/2015   Diabetes mellitus with renal manifestation ([place]) 04/21/2015   Hyperlipidemia 04/21/2015   Bladder cystocele 04/21/2015   Urinary incontinence 04/21/2015   Leg varices 04/21/2015   Insomnia 04/21/2015   Eczema intertrigo 04/21/2015   Obesity, Class III, BMI 40-49.9 (morbid obesity) ([place]) 04/21/2015   OSA (obstructive sleep apnea) 04/21/2015   Osteoarthritis of multiple joints 04/21/2015   H/O Spinal surgery 11/14/2013   Detrusor muscle hypertonia 11/07/2013    Past Surgical History:  Procedure Laterality Date   ABDOMINAL HYSTERECTOMY  1993   Total   BUNIONECTOMY Bilateral [place] Right 11/19/2017   Procedure: RIGHT KNEE POLY EXCHANGE WITH IRRIGATION AND DEBRIDEMENT;  Surgeon: Hessie Knows, MD;  Location: [facility];  Service: Orthopedics;  Laterality: Right;   IR FLUORO GUIDED NEEDLE PLC ASPIRATION/INJECTION LOC  09/19/2021   JOINT  REPLACEMENT     bilateral knee   LAMINECTOMY  11/14/2013    Cervical Fusion , [facility], Dr. Delilah Shan   LASER ABLATION Bilateral 07/29/2012   Dr. Lucky Cowboy   LOWER EXTREMITY ANGIOGRAPHY Right 11/02/2019   Procedure: Lower Extremity Angiography;  Surgeon: Katha Cabal, MD;  Location: [place] [facility];  Service: Cardiovascular;  Laterality: Right;   ORIF ANKLE FRACTURE Right 09/02/2019   Procedure: OPEN REDUCTION INTERNAL FIXATION (ORIF) ANKLE FRACTURE BIMALLEOLAR;  Surgeon: [name], DPM;  Location: [facility];  Service: Podiatry;  Laterality: Right;   PERIPHERAL VASCULAR THROMBECTOMY Right 03/02/2020   Procedure: PERIPHERAL VASCULAR THROMBECTOMY;  Surgeon: Algernon Huxley, MD;  Location: [facility];  Service: Cardiovascular;  Laterality: Right;   REPLACEMENT TOTAL KNEE Left 03/2010   Dr. Rudene Christians   TEE WITHOUT CARDIOVERSION N/A 09/19/2021   Procedure: TRANSESOPHAGEAL ECHOCARDIOGRAM (TEE);  Surgeon: Corey Skains, MD;  Location: [facility];  Service: Cardiovascular;  Laterality: N/A;   TOTAL HIP ARTHROPLASTY Left 12/11/2015  Procedure: TOTAL HIP ARTHROPLASTY ANTERIOR APPROACH;  Surgeon: Hessie Knows, MD;  Location: [facility];  Service: Orthopedics;  Laterality: Left;   TOTAL KNEE ARTHROPLASTY Right 03/11/2016   Procedure: TOTAL KNEE ARTHROPLASTY;  Surgeon: Hessie Knows, MD;  Location: [facility];  Service: Orthopedics;  Laterality: Right;    Family History  Problem Relation Age of Onset   Diabetes Mother    Hyperlipidemia Mother    Hypertension Mother    Diabetes Father    Hyperlipidemia Father    Hypertension Father    Obesity Father    Hypertension Sister    Hyperlipidemia Sister    Hyperlipidemia Sister    Breast cancer Neg Hx     Social History   Tobacco Use   Smoking status: Never   Smokeless tobacco: Never  Substance Use Topics   Alcohol use: No    Alcohol/week: 0.0 standard drinks of alcohol     Current Outpatient Medications:    ACCU-CHEK GUIDE test strip, TEST ONCE  A DAY AS DIRECTED, Disp: 50 strip, Rfl: 1   acetaminophen (TYLENOL) 650 MG CR tablet, Take 650-1,300 mg by mouth 2 (two) times daily as needed for pain., Disp: , Rfl:    amiodarone (PACERONE) 200 MG tablet, Take 200 mg by mouth daily., Disp: , Rfl:    apixaban (ELIQUIS) 5 MG TABS tablet, Take 1 tablet by mouth 2 (two) times daily., Disp: , Rfl:    atorvastatin (LIPITOR) 40 MG tablet, Take 1 tablet (40 mg total) by mouth every evening., Disp: 90 tablet, Rfl: 1   blood glucose meter kit and supplies, 1 each by Other route daily at 12 noon. Dispense based on patient and insurance preference. Check fsbs once a day, Disp: 1 each, Rfl: 0   Calcium Carbonate-Vitamin D 600-400 MG-UNIT tablet, Take 1 tablet by mouth 2 (two) times daily., Disp: 60 tablet, Rfl: 0   dapagliflozin propanediol (FARXIGA) 10 MG TABS tablet, Take 1 tablet (10 mg total) by mouth daily before breakfast. Patient receives via [employer] Patient Assistance, Disp: 90 tablet, Rfl: 3   escitalopram (LEXAPRO) 5 MG tablet, Take 1 tablet (5 mg total) by mouth daily., Disp: 90 tablet, Rfl: 1   ferrous sulfate 325 (65 FE) MG EC tablet, Take 1 tablet (325 mg total) by mouth daily., Disp: 30 tablet, Rfl: 4   fluticasone (FLONASE) 50 MCG/ACT nasal spray, SPRAY 2 SPRAYS INTO EACH NOSTRIL EVERY DAY, Disp: 48 mL, Rfl: 0   hydroquinone 4 % cream, Apply topically at bedtime., Disp: , Rfl:    latanoprost (XALATAN) 0.005 % ophthalmic solution, SMARTSIG:In Eye(s), Disp: , Rfl:    levothyroxine (SYNTHROID) 50 MCG tablet, Take 1 tablet (50 mcg total) by mouth daily., Disp: 90 tablet, Rfl: 0   Magnesium Oxide 500 MG CAPS, Take 500 mg by mouth daily. , Disp: , Rfl:    metoprolol succinate (TOPROL-XL) 50 MG 24 hr tablet, Take 50 mg by mouth daily., Disp: , Rfl:    oxybutynin (DITROPAN-XL) 10 MG 24 hr tablet, Take 1 tablet (10 mg total) by mouth daily., Disp: 30 tablet, Rfl: 6   Oxycodone HCl 10 MG TABS, Take 1 tablet by mouth 4 (four) times daily as needed., Disp: ,  Rfl:    polyethylene glycol (MIRALAX / GLYCOLAX) 17 g packet, Take 17 g by mouth daily. (Patient taking differently: Take 17 g by mouth every other day.), Disp: 14 each, Rfl: 0   potassium chloride (KLOR-CON) 10 MEQ tablet, Take 10 mEq by mouth daily., Disp: , Rfl:  sodium chloride (OCEAN) 0.65 % SOLN nasal spray, Place 1 spray into both nostrils 3 (three) times daily., Disp: 30 mL, Rfl: 0   torsemide (DEMADEX) 20 MG tablet, TAKE 1 TABLET BY MOUTH EVERY DAY, Disp: 90 tablet, Rfl: 1   traZODone (DESYREL) 50 MG tablet, Take 0.5-1 tablets (25-50 mg total) by mouth at bedtime as needed. for sleep, Disp: 90 tablet, Rfl: 0   SIMBRINZA 1-0.2 % SUSP, Place 1 drop into both eyes 2 (two) times daily. (Patient not taking: Reported on 12/01/2022), Disp: , Rfl:   Allergies  Allergen Reactions   Lisinopril Swelling    I personally reviewed active problem list, medication list, allergies, family history, social history, health maintenance with the patient/caregiver today.   ROS  Constitutional: Negative for fever , positive for  weight change.  Respiratory: Negative for cough and shortness of breath.   Cardiovascular: Negative for chest pain or palpitations.  Gastrointestinal: Negative for abdominal pain, no bowel changes.  Musculoskeletal: Negative for gait problem or joint swelling.  Skin: Negative for rash.  Neurological: Negative for dizziness or headache.  No other specific complaints in a complete review of systems (except as listed in HPI above).   Objective  Vitals:   12/01/22 1143  BP: 122/74  Pulse: (!) 57  Resp: 16  SpO2: 96%  Weight: 237 lb (107.5 kg)  Height: 5\' 11"  (1.803 m)    Body mass index is 33.05 kg/m.  Physical Exam  Constitutional: Patient appears well-developed and well-nourished. Obese  No distress.  HEENT: head atraumatic, normocephalic, pupils equal and reactive to light, neck supple Cardiovascular: Normal rate, regular rhythm and normal heart sounds.  No  murmur heard. No BLE edema. Right thigh is bigger than left but improving  Pulmonary/Chest: Effort normal and breath sounds normal. No respiratory distress. Abdominal: Soft.  There is no tenderness. Psychiatric: Patient has a normal mood and affect. behavior is normal. Judgment and thought content normal.    PHQ2/9:    12/01/2022   11:42 AM 08/29/2022    2:54 PM 08/05/2022   10:25 AM 06/25/2022    8:41 AM 06/19/2022   10:20 AM  Depression screen PHQ 2/9  Decreased Interest 0 0 0 0 0  Down, Depressed, Hopeless 0 0 0 0 0  PHQ - 2 Score 0 0 0 0 0  Altered sleeping 0 0  0 0  Tired, decreased energy 0 0  0 0  Change in appetite 0 0  0 0  Feeling bad or failure about yourself  0 0  0 0  Trouble concentrating 0 0  0 0  Moving slowly or fidgety/restless 0 0  0 0  Suicidal thoughts 0 0  0 0  PHQ-9 Score 0 0  0 0    phq 9 is negative   Fall Risk:    12/01/2022   11:42 AM 08/29/2022    2:54 PM 08/05/2022   10:26 AM 06/25/2022    8:41 AM 06/19/2022   10:20 AM  Fall Risk   Falls in the past year? 0 0 0 0 0  Number falls in past yr: 0  0    Injury with Fall? 0  0    Risk for fall due to : Impaired balance/gait;Impaired mobility No Fall Risks No Fall Risks Impaired balance/gait;Impaired mobility No Fall Risks  Follow up Falls prevention discussed Falls prevention discussed;Education provided;Falls evaluation completed Falls evaluation completed Falls prevention discussed;Education provided Falls evaluation completed      Functional Status Survey:  Is the patient deaf or have difficulty hearing?: No Does the patient have difficulty seeing, even when wearing glasses/contacts?: No Does the patient have difficulty concentrating, remembering, or making decisions?: No Does the patient have difficulty walking or climbing stairs?: Yes Does the patient have difficulty dressing or bathing?: No Does the patient have difficulty doing errands alone such as visiting a doctor's office or  shopping?: No    Assessment & Plan  1. Type 2 diabetes mellitus with diabetic neuropathy, without long-term current use of insulin (HCC)  - POCT HgB A1C  2. AF (paroxysmal atrial fibrillation) (HCC)  Currently on rate control   3. PAD (peripheral artery disease) ([place])  On medical management   4. Stage 3b chronic kidney disease (HCC)  Mild recent drop , we will recheck next visit   5. Pulmonary hypertension (HCC)  Needs CPAP supplies   6. Atherosclerosis of aorta ([place])  On statin therapy   7. Chronic venous hypertension due to deep vein thrombosis (DVT)  On anticoagulation  8. Chronic infection of right knee (HCC)  Under the care of ID  9. Mild protein-calorie malnutrition ([place])  improving  10. OSA (obstructive sleep apnea)  Needs CPAP supplies, I asked Rexford Maus, CMA to see if she can get paperwork   11. Obesity (BMI 30.0-34.9)  Losing weight, likely from increase in activity and decrease of swelling, labs improving, albumin is better and also anemia resolved  12. B12 deficiency  Continue supplementation   13. Hypothyroidism (acquired)  - levothyroxine (SYNTHROID) 50 MCG tablet; Take 1 tablet (50 mcg total) by mouth daily.  Dispense: 90 tablet; Refill: 0  14. Essential hypertension  At goal   15. Primary osteoarthritis of both knees   16. Esophageal dysphagia  - DG UGI W DOUBLE CM (HD BA); Future  17. Dyslipidemia  - atorvastatin (LIPITOR) 40 MG tablet; Take 1 tablet (40 mg total) by mouth every evening.  Dispense: 90 tablet; Refill: 1  18. Anxiety  - escitalopram (LEXAPRO) 5 MG tablet; Take 1 tablet (5 mg total) by mouth daily.  Dispense: 90 tablet; Refill: 1  19. Localized edema  - torsemide (DEMADEX) 20 MG tablet; Take 1 tablet (20 mg total) by mouth daily.  Dispense: 90 tablet; Refill: 1  20. Other insomnia  - traZODone (DESYREL) 50 MG tablet; Take 0.5-1 tablets (25-50 mg total) by mouth at bedtime as needed. for sleep   Dispense: 90 tablet; Refill: 0

## 2022-12-01 ENCOUNTER — Encounter: Payer: Self-pay | Admitting: Family Medicine

## 2022-12-01 ENCOUNTER — Ambulatory Visit (INDEPENDENT_AMBULATORY_CARE_PROVIDER_SITE_OTHER): Payer: Medicare Other | Admitting: Family Medicine

## 2022-12-01 VITALS — BP 122/74 | HR 57 | Resp 16 | Ht 71.0 in | Wt 237.0 lb

## 2022-12-01 DIAGNOSIS — Z89611 Acquired absence of right leg above knee: Secondary | ICD-10-CM | POA: Insufficient documentation

## 2022-12-01 DIAGNOSIS — M009 Pyogenic arthritis, unspecified: Secondary | ICD-10-CM

## 2022-12-01 DIAGNOSIS — F419 Anxiety disorder, unspecified: Secondary | ICD-10-CM

## 2022-12-01 DIAGNOSIS — R600 Localized edema: Secondary | ICD-10-CM

## 2022-12-01 DIAGNOSIS — I700 Atherosclerosis of aorta: Secondary | ICD-10-CM | POA: Diagnosis not present

## 2022-12-01 DIAGNOSIS — I87099 Postthrombotic syndrome with other complications of unspecified lower extremity: Secondary | ICD-10-CM | POA: Diagnosis not present

## 2022-12-01 DIAGNOSIS — E669 Obesity, unspecified: Secondary | ICD-10-CM

## 2022-12-01 DIAGNOSIS — E538 Deficiency of other specified B group vitamins: Secondary | ICD-10-CM | POA: Diagnosis not present

## 2022-12-01 DIAGNOSIS — N1832 Chronic kidney disease, stage 3b: Secondary | ICD-10-CM

## 2022-12-01 DIAGNOSIS — I480 Paroxysmal atrial fibrillation: Secondary | ICD-10-CM

## 2022-12-01 DIAGNOSIS — E785 Hyperlipidemia, unspecified: Secondary | ICD-10-CM

## 2022-12-01 DIAGNOSIS — G4709 Other insomnia: Secondary | ICD-10-CM

## 2022-12-01 DIAGNOSIS — E1140 Type 2 diabetes mellitus with diabetic neuropathy, unspecified: Secondary | ICD-10-CM

## 2022-12-01 DIAGNOSIS — E66811 Obesity, class 1: Secondary | ICD-10-CM

## 2022-12-01 DIAGNOSIS — J849 Interstitial pulmonary disease, unspecified: Secondary | ICD-10-CM | POA: Insufficient documentation

## 2022-12-01 DIAGNOSIS — E441 Mild protein-calorie malnutrition: Secondary | ICD-10-CM

## 2022-12-01 DIAGNOSIS — R1319 Other dysphagia: Secondary | ICD-10-CM

## 2022-12-01 DIAGNOSIS — E039 Hypothyroidism, unspecified: Secondary | ICD-10-CM

## 2022-12-01 DIAGNOSIS — I272 Pulmonary hypertension, unspecified: Secondary | ICD-10-CM

## 2022-12-01 DIAGNOSIS — I739 Peripheral vascular disease, unspecified: Secondary | ICD-10-CM | POA: Diagnosis not present

## 2022-12-01 DIAGNOSIS — G4733 Obstructive sleep apnea (adult) (pediatric): Secondary | ICD-10-CM | POA: Diagnosis not present

## 2022-12-01 DIAGNOSIS — M170 Bilateral primary osteoarthritis of knee: Secondary | ICD-10-CM

## 2022-12-01 DIAGNOSIS — I10 Essential (primary) hypertension: Secondary | ICD-10-CM

## 2022-12-01 LAB — POCT GLYCOSYLATED HEMOGLOBIN (HGB A1C): Hemoglobin A1C: 5 % (ref 4.0–5.6)

## 2022-12-01 MED ORDER — TRAZODONE HCL 50 MG PO TABS: 25.0000 mg | ORAL_TABLET | Freq: Every evening | ORAL | 0 refills | Status: DC | PRN

## 2022-12-01 MED ORDER — LEVOTHYROXINE SODIUM 50 MCG PO TABS: 50.0000 ug | ORAL_TABLET | Freq: Every day | ORAL | 0 refills | Status: DC

## 2022-12-01 MED ORDER — TORSEMIDE 20 MG PO TABS: 20.0000 mg | ORAL_TABLET | Freq: Every day | ORAL | 1 refills | Status: DC

## 2022-12-01 MED ORDER — ATORVASTATIN CALCIUM 40 MG PO TABS: 40.0000 mg | ORAL_TABLET | Freq: Every evening | ORAL | 1 refills | Status: DC

## 2022-12-01 MED ORDER — ESCITALOPRAM OXALATE 5 MG PO TABS: 5.0000 mg | ORAL_TABLET | Freq: Every day | ORAL | 1 refills | Status: DC

## 2022-12-02 ENCOUNTER — Telehealth: Payer: Self-pay | Admitting: Family Medicine

## 2022-12-02 NOTE — Telephone Encounter (Signed)
Pt checking status of scan that she is suppose to do. Stated it is suppose to be completed by the 25th. Is she able to just go over there or does she have to wait for an appt? Please advise

## 2022-12-02 NOTE — Telephone Encounter (Signed)
Copied from [place]. Topic: General - Other >> Dec 02, 2022  9:59 AM Oley Balm A wrote: Reason for CRM: Pt is wanting [name] to give her a call back. >> Dec 02, 2022 10:22 AM [name] G wrote:

## 2022-12-02 NOTE — Telephone Encounter (Signed)
Spoke with pt and gave her the telephone number provided.

## 2022-12-05 ENCOUNTER — Telehealth: Payer: Self-pay

## 2022-12-05 ENCOUNTER — Other Ambulatory Visit: Payer: Self-pay | Admitting: Family Medicine

## 2022-12-05 DIAGNOSIS — J3089 Other allergic rhinitis: Secondary | ICD-10-CM

## 2022-12-05 NOTE — Telephone Encounter (Signed)
Copied from [place]. Topic: General - Inquiry >> Dec 05, 2022 11:11 AM Erskine Squibb wrote: Reason for CRM: Allie with [facility] [place] called in stating she is going to refax over paperwork regarding an order for a cpap for the patient. She is in need of several things which will be indicated on the fax. Please assist further and fax back to [phone]

## 2022-12-07 ENCOUNTER — Other Ambulatory Visit: Payer: Self-pay | Admitting: Oncology

## 2022-12-09 ENCOUNTER — Encounter: Payer: Self-pay | Admitting: Oncology

## 2022-12-11 ENCOUNTER — Ambulatory Visit
Admission: RE | Admit: 2022-12-11 | Discharge: 2022-12-11 | Disposition: A | Discharge status: Home / Self Care | Payer: Medicare Other | Source: Ambulatory Visit | Attending: Family Medicine | Admitting: Family Medicine

## 2022-12-11 DIAGNOSIS — K449 Diaphragmatic hernia without obstruction or gangrene: Secondary | ICD-10-CM | POA: Diagnosis not present

## 2022-12-11 DIAGNOSIS — K224 Dyskinesia of esophagus: Secondary | ICD-10-CM | POA: Diagnosis not present

## 2022-12-11 DIAGNOSIS — R1319 Other dysphagia: Secondary | ICD-10-CM | POA: Insufficient documentation

## 2022-12-12 ENCOUNTER — Other Ambulatory Visit: Payer: Self-pay | Admitting: Family Medicine

## 2022-12-12 DIAGNOSIS — K449 Diaphragmatic hernia without obstruction or gangrene: Secondary | ICD-10-CM

## 2022-12-12 DIAGNOSIS — K224 Dyskinesia of esophagus: Secondary | ICD-10-CM

## 2022-12-15 ENCOUNTER — Other Ambulatory Visit: Payer: Self-pay | Admitting: Family Medicine

## 2022-12-15 ENCOUNTER — Telehealth: Payer: Self-pay | Admitting: Family Medicine

## 2022-12-15 NOTE — Telephone Encounter (Signed)
Pt spoke with [name] and was told that Cristie Hem would no longer be able to assist her with getting the eliquis and for her to reach out with Dr Ancil Boozer to see where she could get her medication or assistance. Pt is not completely out of this medication but is pretty low. Please advise as to what she should do

## 2022-12-15 NOTE — Telephone Encounter (Signed)
Pt called back and stated that she was told that Cristie Hem would no longer be representing her and Dr Ancil Boozer would have to find her someone else to represent her. Please advise

## 2022-12-16 ENCOUNTER — Other Ambulatory Visit: Payer: Self-pay | Admitting: Family Medicine

## 2022-12-16 DIAGNOSIS — I82541 Chronic embolism and thrombosis of right tibial vein: Secondary | ICD-10-CM

## 2022-12-16 NOTE — Telephone Encounter (Signed)
I helped her in the past, but patient is no longer part of the [facility] (No longer has a green banner) so I am no longer able to follow along with her care. If they are needing help with patient assistance, you can place a referral using [id number] to place a referral with one of the [facility] pharmacists.    Malva Limes, [place] Pharmacist Practitioner  [facility] [phone]

## 2022-12-17 ENCOUNTER — Telehealth: Payer: Self-pay

## 2022-12-17 NOTE — Progress Notes (Signed)
   12/17/2022  Patient ID: [name], female   DOB: May 16, 1946, 77 y.o.   MRN: [id number]  Subjective/Objective: Telephone visit in regard to referral placed by Dr. Ancil Boozer to assist patient with affordability of Eliquis  Medication Management -patient approved for Eliquis PAP through [employer] for 2023 but this has expired -she has currently been out of her Eliquis 5mg  3-4 days now and cannot afford copay -endorses application for PAP was recently submitted to [employer], but she has not heard anything in regard to the application status  Assessment/Plan: Medication Management -[employer] PAP for Eliquis requirements include:  denial of LIS, 3% out of pocket expenditure, house hold income <$45,180 for a single person or $61,320 for a family of two -contacted [employer] PAP, and they received application as of 4/2; but out of pocket medication expenses submitted were for 2023 versus 2024 -patient needs to fax or mail 2024 expense statement -contacting Ms. Ogando to inform her and provide fax number/mailing address and let her know she may receive a denial until this is received -contacted Dr. Ancil Boozer to see if office has samples; they do not at this time but are going to try to contact drug rep  Follow-up:  Will contact patient if/when sample available for pickup and will continue to track application status  Darlina Guys, PharmD, DPLA

## 2022-12-19 ENCOUNTER — Telehealth: Payer: Self-pay

## 2022-12-19 NOTE — Telephone Encounter (Signed)
Spoke with patient to let her know we have 5 weeks of Eliquis samples for her to pick up.

## 2022-12-19 NOTE — Telephone Encounter (Signed)
Reprinted sleep study order/notes. Will fax after obtaining signature upon Dr. Shelbie Ammons' return. Trey Paula notified.   Copied from CRM [id number] [id number]. Topic: General - Other >> Dec 18, 2022  3:31 PM Franchot Heidelberg wrote: Reason for CRM: John from [facility] called reporting that they need office notes stating that the patient is using and benefiting from the CPAP supplies   Best contact: [phone] Best fax: [phone]  He also reports that the sleep study submitted was not signed by that provider so the insurance will not accept that sleep study

## 2022-12-22 ENCOUNTER — Telehealth: Payer: Self-pay

## 2022-12-22 NOTE — Progress Notes (Signed)
   12/22/2022  Patient ID: Becky Trevino, female   DOB: Mar 22, 1946, 77 y.o.   MRN: [id number]  Outreach attempt to check in with patient unsuccessful but able to leave a message with my direct number for patient to call at her convenience.  Wanting to make sure she picked up the 5 weeks of Eliquis samples from Dr. Carlynn Purl office and see if 2024 out of pocket costs were submitted to complete PAP application.  If I do not hear back, I will try to check in with her in another week or so.  Lenna Gilford, PharmD, DPLA

## 2022-12-24 ENCOUNTER — Telehealth: Payer: Self-pay | Admitting: Family Medicine

## 2022-12-24 NOTE — Telephone Encounter (Signed)
Copied from CRM [phone]. Topic: General - Other >> Dec 24, 2022 12:45 PM Marlow Baars wrote: Reason for CRM: Melanee Spry with [employer] called in to follow up on previous request from John asking for office notes stating the patient would benefit from a cpap machine. He states the fax number that Jonny Ruiz listed is ok but wanted to provider another fax number as well. It is [phone]

## 2022-12-24 NOTE — Telephone Encounter (Signed)
Last note printed and faxed again to new fax number.

## 2022-12-26 DIAGNOSIS — L851 Acquired keratosis [keratoderma] palmaris et plantaris: Secondary | ICD-10-CM | POA: Diagnosis not present

## 2022-12-26 DIAGNOSIS — B351 Tinea unguium: Secondary | ICD-10-CM | POA: Diagnosis not present

## 2022-12-26 DIAGNOSIS — E1142 Type 2 diabetes mellitus with diabetic polyneuropathy: Secondary | ICD-10-CM | POA: Diagnosis not present

## 2022-12-26 LAB — HM DIABETES FOOT EXAM

## 2022-12-29 ENCOUNTER — Inpatient Hospital Stay: Payer: Medicare Other

## 2022-12-30 ENCOUNTER — Inpatient Hospital Stay: Payer: Medicare Other | Attending: Oncology

## 2022-12-30 DIAGNOSIS — I480 Paroxysmal atrial fibrillation: Secondary | ICD-10-CM | POA: Insufficient documentation

## 2022-12-30 DIAGNOSIS — D631 Anemia in chronic kidney disease: Secondary | ICD-10-CM | POA: Diagnosis not present

## 2022-12-30 DIAGNOSIS — N183 Chronic kidney disease, stage 3 unspecified: Secondary | ICD-10-CM | POA: Insufficient documentation

## 2022-12-30 DIAGNOSIS — Z7901 Long term (current) use of anticoagulants: Secondary | ICD-10-CM | POA: Insufficient documentation

## 2022-12-30 DIAGNOSIS — Z86718 Personal history of other venous thrombosis and embolism: Secondary | ICD-10-CM | POA: Diagnosis not present

## 2022-12-30 LAB — CBC WITH DIFFERENTIAL/PLATELET
Abs Immature Granulocytes: 0.02 10*3/uL (ref 0.00–0.07)
Basophils Absolute: 0 10*3/uL (ref 0.0–0.1)
Basophils Relative: 1 %
Eosinophils Absolute: 0.1 10*3/uL (ref 0.0–0.5)
Eosinophils Relative: 2 %
HCT: 40.2 % (ref 36.0–46.0)
Hemoglobin: 12.3 g/dL (ref 12.0–15.0)
Immature Granulocytes: 0 %
Lymphocytes Relative: 21 %
Lymphs Abs: 0.9 10*3/uL (ref 0.7–4.0)
MCH: 29.6 pg (ref 26.0–34.0)
MCHC: 30.6 g/dL (ref 30.0–36.0)
MCV: 96.9 fL (ref 80.0–100.0)
Monocytes Absolute: 0.3 10*3/uL (ref 0.1–1.0)
Monocytes Relative: 7 %
Neutro Abs: 3.1 10*3/uL (ref 1.7–7.7)
Neutrophils Relative %: 69 %
Platelets: 190 10*3/uL (ref 150–400)
RBC: 4.15 MIL/uL (ref 3.87–5.11)
RDW: 18.6 % — ABNORMAL HIGH (ref 11.5–15.5)
WBC: 4.5 10*3/uL (ref 4.0–10.5)
nRBC: 0 % (ref 0.0–0.2)

## 2022-12-30 LAB — IRON AND TIBC
Iron: 55 ug/dL (ref 28–170)
Saturation Ratios: 22 % (ref 10.4–31.8)
TIBC: 251 ug/dL (ref 250–450)
UIBC: 196 ug/dL

## 2022-12-30 LAB — FERRITIN: Ferritin: 64 ng/mL (ref 11–307)

## 2022-12-30 MED FILL — Iron Sucrose Inj 20 MG/ML (Fe Equiv): INTRAVENOUS | Qty: 10 | Status: AC

## 2022-12-31 ENCOUNTER — Inpatient Hospital Stay: Payer: Medicare Other

## 2022-12-31 ENCOUNTER — Inpatient Hospital Stay (HOSPITAL_BASED_OUTPATIENT_CLINIC_OR_DEPARTMENT_OTHER): Payer: Medicare Other | Admitting: Oncology

## 2022-12-31 ENCOUNTER — Encounter: Payer: Self-pay | Admitting: Oncology

## 2022-12-31 VITALS — BP 126/63 | HR 54 | Temp 97.4°F | Resp 18

## 2022-12-31 DIAGNOSIS — I480 Paroxysmal atrial fibrillation: Secondary | ICD-10-CM | POA: Diagnosis not present

## 2022-12-31 DIAGNOSIS — I87099 Postthrombotic syndrome with other complications of unspecified lower extremity: Secondary | ICD-10-CM

## 2022-12-31 DIAGNOSIS — N189 Chronic kidney disease, unspecified: Secondary | ICD-10-CM

## 2022-12-31 DIAGNOSIS — N183 Chronic kidney disease, stage 3 unspecified: Secondary | ICD-10-CM | POA: Diagnosis not present

## 2022-12-31 DIAGNOSIS — Z7901 Long term (current) use of anticoagulants: Secondary | ICD-10-CM | POA: Diagnosis not present

## 2022-12-31 DIAGNOSIS — N1832 Chronic kidney disease, stage 3b: Secondary | ICD-10-CM | POA: Diagnosis not present

## 2022-12-31 DIAGNOSIS — D631 Anemia in chronic kidney disease: Secondary | ICD-10-CM | POA: Diagnosis not present

## 2022-12-31 DIAGNOSIS — Z86718 Personal history of other venous thrombosis and embolism: Secondary | ICD-10-CM | POA: Diagnosis not present

## 2022-12-31 LAB — KAPPA/LAMBDA LIGHT CHAINS
Kappa free light chain: 114.9 mg/L — ABNORMAL HIGH (ref 3.3–19.4)
Kappa, lambda light chain ratio: 1.91 — ABNORMAL HIGH (ref 0.26–1.65)
Lambda free light chains: 60.3 mg/L — ABNORMAL HIGH (ref 5.7–26.3)

## 2022-12-31 NOTE — Progress Notes (Signed)
Hematology/Oncology Progress note [phone] [phone]      REASON FOR VISIT  follow-up for DVT. Anemia due to CKD  ASSESSMENT & PLAN:   Anemia due to chronic kidney disease Labs are reviewed and discussed with patient. Lab Results  Component Value Date   HGB 12.3 12/30/2022   TIBC 251 12/30/2022   IRONPCTSAT 22 12/30/2022   FERRITIN 64 12/30/2022   Hb and iron panel are stable. No need for IV venofer or erythropoitein Recommend patient to start oral iron supplementation ferrous sulfate 325 mg daily.  Chronic venous hypertension due to deep vein thrombosis (DVT) Patient has IVC filter. Continue chronic anticoagulation with Eliquis 5 mg twice daily.- she also has paroxysmal A-Fib  Chronic kidney disease (CKD), stage III (moderate) (HCC) Encourage oral hydration and avoid nephrotoxins.  Check SPEP and light chain   Orders Placed This Encounter  Procedures   CBC with Differential (Cancer Center Only)    Standing Status:   Future    Standing Expiration Date:   12/31/2023   Iron and TIBC    Standing Status:   Future    Standing Expiration Date:   12/31/2023   Ferritin    Standing Status:   Future    Standing Expiration Date:   12/31/2023   Follow up in 6 months.  All questions were answered. The patient knows to call the clinic with any problems, questions or concerns.  [name], MD, PhD [facility] Hematology Oncology 12/31/2022    Pertinent hematology history 77 y.o. female with past medical history listed as below presents for follow-up of DVT. She was admitted from 10/27/2019-11/07/2019 for right lower extremity cellulitis/wound infection, bacteremia with group B streptococcus, right lower extremity DVT. # Patient had ankle fracture status post a recent right ankle ORIF on 09/02/2019 by Dr. Joylene Grapes.  Patient has a wound VAC She was noticed to have right lower extremity swelling, mild erythema and warmth with associated tenderness. 11/24/2019  US venous right lower extremity showed a DVT within the right popliteal vein, with superficial thrombus seen at the saphenofemoral junction. Patient tells me that she was diagnosed with "blood clots" in August 2020.  At that time she had pain and swelling for about a week and went to ER for evaluation.    04/29/2019 ultrasound was negative for DVT, only showed a superficial thrombophlebitis involving a branch of the great saphenous vein in the proximal thigh.  She was fine to have right Baker's cyst.  Patient denies any immobilization factors contributing to this event.  Patient had elevated D-dimer at that time. Patient was started on Xarelto-starter kit by emergency room.  Patient was recommended to have US aorta iliac Doppler by primary care provider Dr. Carlynn Purl and I do not see the study was done.  Xarelto was continued outpatient for thrombophlebitis..  Patient was hospitalized again at the end of November 2020 due to syncope episodes which likely due to orthostatic hypotension.  Patient was noted to have anemia, CT abdomen pelvis to evaluate after trauma, showed left gluteal hematoma without signs of extravasation.  Xarelto was held during the hospitalization.  Saw cardiology Dr.Callwood.  Patient was seen by Dr. Carlynn Purl on 08/23/2019 and the Xarelto was stopped.   09/02/2019 patient slipped and obtained right ankle fracture.  Status post ORIF procedure by Dr. Excell Seltzer.  Postop patient was recommended to start on Lovenox 40 mg daily injections prophylactically until her presentation to emergency room in February.  At that point, she was found to  have a provoked right lower extremity DVT and she was seen by me during that admission.  Patient was recommended to start therapeutic Lovenox.  During the same hospitalization, patient developed spontaneous iliopsoas hematoma and therapeutic Lovenox was discontinued. Patient had IVC filter placed on 11/02/2019.  Patient was recommended to have outpatient follow-up with  me. Patient was recently seen by Dr. Carlynn Purl and was found to have worsening of right lower extremity swelling, Doppler ultrasound was obtained on 02/03/2020 which was positive for DVT in the right lower extremity, increased clot burden in the right lower extremity since 10/27/2019.  Thrombus extending from the right common femoral vein to the right calf.  Patient was referred back to me for evaluation and management.  Currently off anticoagulation due to recurrent hematoma.  IVC filter was placed.  No plan for filter retrieval due to risk of recurrent lower extremity thrombosis. Negative antiphospholipid syndrome antibodies, prothrombin gene mutation, factor V Leiden mutation.  INTERVAL HISTORY Becky Trevino is a 77 y.o. female who has above history reviewed by me today presents for follow up visit for DVT Patient was last seen by me in 2021.  Patient present to reestablish care. Patient has chronic lower extremity DVT, on chronic anticoagulation.  Patient also has IVC filter. Eliquis 5 mg twice daily.  She takes oral iron supplementation.  Chronic fatigue has improved.   Review of Systems  Constitutional:  Positive for fatigue. Negative for appetite change, chills and fever.  HENT:   Negative for hearing loss and voice change.   Eyes:  Negative for eye problems.  Respiratory:  Negative for chest tightness and cough.   Cardiovascular:  Positive for leg swelling. Negative for chest pain.       Hip/thigh swelling  Gastrointestinal:  Negative for abdominal distention, abdominal pain and blood in stool.  Endocrine: Negative for hot flashes.  Genitourinary:  Negative for difficulty urinating, frequency and nocturia.   Musculoskeletal:  Negative for arthralgias.  Skin:  Negative for itching and rash.  Neurological:  Negative for extremity weakness.  Hematological:  Negative for adenopathy.  Psychiatric/Behavioral:  Negative for confusion.       Allergies  Allergen Reactions   Lisinopril  Swelling     Past Medical History:  Diagnosis Date   Cervical spondylosis    Chronic kidney disease    Stage 3   DDD (degenerative disc disease), cervical    [facility]   Diabetes mellitus without complication    Diverticulosis    Hyperlipidemia    Hypertension    Intertrigo    Leg cramps    Leg varices    Obesity    OSA (obstructive sleep apnea)    Symptomatic menopausal or female climacteric states    Syncope      Past Surgical History:  Procedure Laterality Date   ABDOMINAL HYSTERECTOMY  1993   Total   BUNIONECTOMY Bilateral 1993   I & D KNEE WITH POLY EXCHANGE Right 11/19/2017   Procedure: RIGHT KNEE POLY EXCHANGE WITH IRRIGATION AND DEBRIDEMENT;  Surgeon: Kennedy Bucker, MD;  Location: [facility] OR[facility];  Service: Orthopedics;  Laterality: Right;   IR FLUORO GUIDED NEEDLE PLC ASPIRATION/INJECTION LOC  09/19/2021   JOINT REPLACEMENT     bilateral knee   LAMINECTOMY  11/14/2013    Cervical Fusion , [facility], Dr. Timoteo Ace   LASER ABLATION Bilateral 07/29/2012   Dr. Wyn Quaker   LOWER EXTREMITY ANGIOGRAPHY Right 11/02/2019   Procedure: Lower Extremity Angiography;  Surgeon: Renford Dills, MD;  Location:  [facility];  Service: Cardiovascular;  Laterality: Right;   ORIF ANKLE FRACTURE Right 09/02/2019   Procedure: OPEN REDUCTION INTERNAL FIXATION (ORIF) ANKLE FRACTURE BIMALLEOLAR;  Surgeon: Rosetta Posner, DPM;  Location: [facility] OR[facility];  Service: Podiatry;  Laterality: Right;   PERIPHERAL VASCULAR THROMBECTOMY Right 03/02/2020   Procedure: PERIPHERAL VASCULAR THROMBECTOMY;  Surgeon: Annice Needy, MD;  Location: [facility];  Service: Cardiovascular;  Laterality: Right;   REPLACEMENT TOTAL KNEE Left 03/2010   Dr. Rosita Kea   TEE WITHOUT CARDIOVERSION N/A 09/19/2021   Procedure: TRANSESOPHAGEAL ECHOCARDIOGRAM (TEE);  Surgeon: Lamar Blinks, MD;  Location: [facility];  Service: Cardiovascular;  Laterality: N/A;   TOTAL HIP ARTHROPLASTY Left 12/11/2015   Procedure: TOTAL HIP  ARTHROPLASTY ANTERIOR APPROACH;  Surgeon: Kennedy Bucker, MD;  Location: [facility];  Service: Orthopedics;  Laterality: Left;   TOTAL KNEE ARTHROPLASTY Right 03/11/2016   Procedure: TOTAL KNEE ARTHROPLASTY;  Surgeon: Kennedy Bucker, MD;  Location: [facility];  Service: Orthopedics;  Laterality: Right;    Social History   Socioeconomic History   Marital status: Married    Spouse name: Librada Castronovo   Number of children: 1   Years of education: Not on file   Highest education level: Not on file  Occupational History   Occupation: Retired  Tobacco Use   Smoking status: Never   Smokeless tobacco: Never  Vaping Use   Vaping Use: Never used  Substance and Sexual Activity   Alcohol use: No    Alcohol/week: 0.0 standard drinks of alcohol   Drug use: No   Sexual activity: Yes    Partners: Male  Other Topics Concern   Not on file  Social History Narrative   Married, one adopted grown child    Social Determinants of Health   Financial Resource Strain: High Risk (08/05/2022)   Overall Financial Resource Strain (CARDIA)    Difficulty of Paying Living Expenses: Very hard  Food Insecurity: No Food Insecurity (09/02/2022)   Hunger Vital Sign    Worried About Running Out of Food in the Last Year: Never true    Ran Out of Food in the Last Year: Never true  Transportation Needs: No Transportation Needs (09/02/2022)   PRAPARE - Administrator, Civil Service (Medical): No    Lack of Transportation (Non-Medical): No  Physical Activity: Insufficiently Active (08/05/2022)   Exercise Vital Sign    Days of Exercise per Week: 5 days    Minutes of Exercise per Session: 20 min  Stress: No Stress Concern Present (08/05/2022)   [employer] of Occupational Health - Occupational Stress Questionnaire    Feeling of Stress : Not at all  Social Connections: Socially Integrated (08/05/2022)   Social Connection and Isolation Panel [NHANES]    Frequency of Communication with Friends and  Family: Twice a week    Frequency of Social Gatherings with Friends and Family: Once a week    Attends Religious Services: More than 4 times per year    Active Member of [employer] or Organizations: Yes    Attends Banker Meetings: 1 to 4 times per year    Marital Status: Married  Catering manager Violence: Not At Risk (08/05/2022)   Humiliation, Afraid, Rape, and Kick questionnaire    Fear of Current or Ex-Partner: No    Emotionally Abused: No    Physically Abused: No    Sexually Abused: No    Family History  Problem Relation Age of Onset   Diabetes Mother  Hyperlipidemia Mother    Hypertension Mother    Diabetes Father    Hyperlipidemia Father    Hypertension Father    Obesity Father    Hypertension Sister    Hyperlipidemia Sister    Hyperlipidemia Sister    Breast cancer Neg Hx      Current Outpatient Medications:    ACCU-CHEK GUIDE test strip, TEST ONCE A DAY AS DIRECTED, Disp: 50 strip, Rfl: 1   acetaminophen (TYLENOL) 650 MG CR tablet, Take 650-1,300 mg by mouth 2 (two) times daily as needed for pain., Disp: , Rfl:    amiodarone (PACERONE) 200 MG tablet, Take 200 mg by mouth daily., Disp: , Rfl:    apixaban (ELIQUIS) 5 MG TABS tablet, Take 1 tablet by mouth 2 (two) times daily., Disp: , Rfl:    atorvastatin (LIPITOR) 40 MG tablet, Take 1 tablet (40 mg total) by mouth every evening., Disp: 90 tablet, Rfl: 1   blood glucose meter kit and supplies, 1 each by Other route daily at 12 noon. Dispense based on patient and insurance preference. Check fsbs once a day, Disp: 1 each, Rfl: 0   Calcium Carbonate-Vitamin D 600-400 MG-UNIT tablet, Take 1 tablet by mouth 2 (two) times daily., Disp: 60 tablet, Rfl: 0   dapagliflozin propanediol (FARXIGA) 10 MG TABS tablet, Take 1 tablet (10 mg total) by mouth daily before breakfast. Patient receives via [place] Patient Assistance, Disp: 90 tablet, Rfl: 3   escitalopram (LEXAPRO) 5 MG tablet, Take 1 tablet (5 mg total) by mouth  daily., Disp: 90 tablet, Rfl: 1   ferrous sulfate 325 (65 FE) MG EC tablet, TAKE 1 TABLET BY MOUTH EVERY DAY, Disp: 90 tablet, Rfl: 3   fluticasone (FLONASE) 50 MCG/ACT nasal spray, SPRAY 2 SPRAYS INTO EACH NOSTRIL EVERY DAY, Disp: 48 mL, Rfl: 0   hydroquinone 4 % cream, Apply topically at bedtime., Disp: , Rfl:    latanoprost (XALATAN) 0.005 % ophthalmic solution, SMARTSIG:In Eye(s), Disp: , Rfl:    levothyroxine (SYNTHROID) 50 MCG tablet, Take 1 tablet (50 mcg total) by mouth daily., Disp: 90 tablet, Rfl: 0   Magnesium Oxide 500 MG CAPS, Take 500 mg by mouth daily. , Disp: , Rfl:    metoprolol succinate (TOPROL-XL) 50 MG 24 hr tablet, Take 50 mg by mouth daily., Disp: , Rfl:    oxybutynin (DITROPAN-XL) 10 MG 24 hr tablet, Take 1 tablet (10 mg total) by mouth daily., Disp: 30 tablet, Rfl: 6   Oxycodone HCl 10 MG TABS, Take 1 tablet by mouth 4 (four) times daily as needed., Disp: , Rfl:    polyethylene glycol (MIRALAX / GLYCOLAX) 17 g packet, Take 17 g by mouth daily. (Patient taking differently: Take 17 g by mouth every other day.), Disp: 14 each, Rfl: 0   potassium chloride (KLOR-CON) 10 MEQ tablet, Take 10 mEq by mouth daily., Disp: , Rfl:    sodium chloride (OCEAN) 0.65 % SOLN nasal spray, Place 1 spray into both nostrils 3 (three) times daily., Disp: 30 mL, Rfl: 0   torsemide (DEMADEX) 20 MG tablet, Take 1 tablet (20 mg total) by mouth daily., Disp: 90 tablet, Rfl: 1   traZODone (DESYREL) 50 MG tablet, Take 0.5-1 tablets (25-50 mg total) by mouth at bedtime as needed. for sleep, Disp: 90 tablet, Rfl: 0  Physical exam:  Vitals:   12/31/22 1326  BP: 126/63  Pulse: (!) 54  Resp: 18  Temp: (!) 97.4 F (36.3 C)   Physical Exam Constitutional:  General: She is not in acute distress.    Appearance: She is obese.     Comments: Patient sits in the wheelchair  HENT:     Head: Normocephalic and atraumatic.  Eyes:     General: No scleral icterus. Cardiovascular:     Rate and Rhythm:  Normal rate and regular rhythm.     Heart sounds: Normal heart sounds.  Pulmonary:     Effort: Pulmonary effort is normal. No respiratory distress.     Breath sounds: No wheezing.  Abdominal:     General: Bowel sounds are normal. There is no distension.     Palpations: Abdomen is soft.  Musculoskeletal:        General: Swelling present. No deformity. Normal range of motion.     Cervical back: Normal range of motion and neck supple.     Comments: Bilateral lower extremity swelling  Skin:    General: Skin is warm and dry.     Findings: No erythema or rash.  Neurological:     Mental Status: She is alert and oriented to person, place, and time. Mental status is at baseline.     Cranial Nerves: No cranial nerve deficit.     Coordination: Coordination normal.  Psychiatric:        Mood and Affect: Mood normal.        Latest Ref Rng & Units 11/07/2022    9:55 AM  CMP  Glucose 70 - 99 mg/dL 161   BUN 8 - 23 mg/dL 27   Creatinine 0.96 - 1.00 mg/dL 0.45   Sodium 409 - 811 mmol/L 139   Potassium 3.5 - 5.1 mmol/L 4.1   Chloride 98 - 111 mmol/L 101   CO2 22 - 32 mmol/L 31   Calcium 8.9 - 10.3 mg/dL 8.8   Total Protein 6.5 - 8.1 g/dL 9.0   Total Bilirubin 0.3 - 1.2 mg/dL 0.4   Alkaline Phos 38 - 126 U/L 94   AST 15 - 41 U/L 26   ALT 0 - 44 U/L 16       Latest Ref Rng & Units 12/30/2022    9:26 AM  CBC  WBC 4.0 - 10.5 K/uL 4.5   Hemoglobin 12.0 - 15.0 g/dL 91.4   Hematocrit 78.2 - 46.0 % 40.2   Platelets 150 - 400 K/uL 190     RADIOGRAPHIC STUDIES: I have personally reviewed the radiological images as listed and agreed with the findings in the report. DG UGI W DOUBLE CM (HD BA)  Result Date: 12/11/2022 CLINICAL DATA:  Patient with complaint of solid-food dysphagia with regurgitation. Patient referred for fluoroscopic upper GI study. EXAM: UPPER GI SERIES WITH HIGH DENSITY WITHOUT KUB TECHNIQUE: Scout radiograph was obtained. Combined double and single contrast examination was  performed using effervescent crystals, high-density barium and thin liquid barium. This exam was performed by Alex Gardener, NP, and was supervised and interpreted by Lesia Hausen, MD. FLUOROSCOPY: Radiation Exposure Index (as provided by the fluoroscopic device): 159.20 mGy Kerma COMPARISON:  None Available. FINDINGS: Evaluation significantly limited due to poor patient mobility and difficulty with positioning. Within this confine: Esophagus: Esophagus is patulous in appearance. Esophageal motility: Severe esophageal dysmotility with tertiary contractions. To and fro motion and stasis of barium throughout study. Gastroesophageal reflux: Unable to visualize gastroesophageal reflux due to stasis of barium in the esophagus during exam. Ingested 13mm barium tablet: The 13 mm tablet appears to pass into the stomach though evaluation is difficult due to significant volume of  static contrast. Stomach: Moderate-sized hiatal hernia with narrowing at the GE junction. No definite mucosal abnormality is seen, within the above confines. Gastric emptying: Normal. Duodenum: No definite mucosal abnormality is seen, within the above confines. Other:  IVC filter in place IMPRESSION: 1. Limited study due to limited patient mobility and difficulty with positioning. 2. Patulous esophagus with severe dysmotility with tertiary contractions. 3. No definite obstruction to the passage of the 13 mm tablet. 4. Hiatal hernia. Electronically Signed   By: Lesia Hausen M.D.   On: 12/11/2022 16:41

## 2022-12-31 NOTE — Assessment & Plan Note (Signed)
Encourage oral hydration and avoid nephrotoxins.  Check SPEP and light chain

## 2022-12-31 NOTE — Assessment & Plan Note (Addendum)
Labs are reviewed and discussed with patient. Lab Results  Component Value Date   HGB 12.3 12/30/2022   TIBC 251 12/30/2022   IRONPCTSAT 22 12/30/2022   FERRITIN 64 12/30/2022   Hb and iron panel are stable. No need for IV venofer or erythropoitein Recommend patient to start oral iron supplementation ferrous sulfate 325 mg daily.

## 2022-12-31 NOTE — Assessment & Plan Note (Addendum)
Patient has IVC filter. Continue chronic anticoagulation with Eliquis 5 mg twice daily.- she also has paroxysmal A-Fib

## 2023-01-02 LAB — MULTIPLE MYELOMA PANEL, SERUM
Albumin SerPl Elph-Mcnc: 3.1 g/dL (ref 2.9–4.4)
Albumin/Glob SerPl: 0.7 (ref 0.7–1.7)
Alpha 1: 0.3 g/dL (ref 0.0–0.4)
Alpha2 Glob SerPl Elph-Mcnc: 1 g/dL (ref 0.4–1.0)
B-Globulin SerPl Elph-Mcnc: 1.1 g/dL (ref 0.7–1.3)
Gamma Glob SerPl Elph-Mcnc: 2.2 g/dL — ABNORMAL HIGH (ref 0.4–1.8)
Globulin, Total: 4.6 g/dL — ABNORMAL HIGH (ref 2.2–3.9)
IgA: 693 mg/dL — ABNORMAL HIGH (ref 64–422)
IgG (Immunoglobin G), Serum: 2197 mg/dL — ABNORMAL HIGH (ref 586–1602)
IgM (Immunoglobulin M), Srm: 139 mg/dL (ref 26–217)
Total Protein ELP: 7.7 g/dL (ref 6.0–8.5)

## 2023-01-05 ENCOUNTER — Telehealth: Payer: Self-pay

## 2023-01-05 NOTE — Progress Notes (Signed)
   01/05/2023  Patient ID: Becky Trevino, female   DOB: 11-17-1945, 77 y.o.   MRN: [id number]  Subjective/Objective: Patient encounter to verify Becky Trevino received 5 weeks of Eliquis samples from Dr. Carlynn Purl office and check on PAP application progress.   Medication Management -Patient was able to pick samples up and has medication on hand; approximately 3 week supply remaining -Has not requested 2024 OOP expense information from pharmacy yet; reminded this is necessary to complete PAP application process  Assessment/Plan: Medication Management -Patient states she will contact pharmacy and request OOP expense print out to be mailed to her -Verified she still has [facility] mailing address to send this information to -Also has my direct number if any questions or additional needs arise  Lenna Gilford, PharmD, DPLA

## 2023-01-06 ENCOUNTER — Telehealth: Payer: Self-pay | Admitting: Family Medicine

## 2023-01-07 DIAGNOSIS — M25361 Other instability, right knee: Secondary | ICD-10-CM | POA: Diagnosis not present

## 2023-01-08 NOTE — Telephone Encounter (Signed)
Copied from CRM [id number]. Topic: General - Other >> Jan 06, 2023 12:31 PM Everette C wrote: Reason for CRM: Melanee Spry with [employer] has called to follow up on a requested addendum to an office note from 12/01/22 stating that the patient is using and benefiting from CPAP therapy  as well as notes from a sleep study   Please contact further when possible

## 2023-01-09 ENCOUNTER — Other Ambulatory Visit (INDEPENDENT_AMBULATORY_CARE_PROVIDER_SITE_OTHER): Payer: Self-pay | Admitting: Nurse Practitioner

## 2023-01-09 DIAGNOSIS — R600 Localized edema: Secondary | ICD-10-CM

## 2023-01-14 ENCOUNTER — Ambulatory Visit (INDEPENDENT_AMBULATORY_CARE_PROVIDER_SITE_OTHER): Payer: Medicare Other

## 2023-01-14 DIAGNOSIS — R600 Localized edema: Secondary | ICD-10-CM | POA: Diagnosis not present

## 2023-01-23 ENCOUNTER — Other Ambulatory Visit: Payer: Self-pay | Admitting: Family Medicine

## 2023-01-23 DIAGNOSIS — E039 Hypothyroidism, unspecified: Secondary | ICD-10-CM

## 2023-01-29 ENCOUNTER — Encounter (INDEPENDENT_AMBULATORY_CARE_PROVIDER_SITE_OTHER): Payer: Self-pay | Admitting: Nurse Practitioner

## 2023-01-29 ENCOUNTER — Ambulatory Visit (INDEPENDENT_AMBULATORY_CARE_PROVIDER_SITE_OTHER): Payer: Medicare Other | Admitting: Vascular Surgery

## 2023-01-29 ENCOUNTER — Telehealth (INDEPENDENT_AMBULATORY_CARE_PROVIDER_SITE_OTHER): Payer: Self-pay

## 2023-01-29 VITALS — BP 127/74 | HR 53 | Resp 18 | Ht 71.0 in | Wt 237.0 lb

## 2023-01-29 DIAGNOSIS — I10 Essential (primary) hypertension: Secondary | ICD-10-CM | POA: Diagnosis not present

## 2023-01-29 DIAGNOSIS — I480 Paroxysmal atrial fibrillation: Secondary | ICD-10-CM

## 2023-01-29 DIAGNOSIS — E782 Mixed hyperlipidemia: Secondary | ICD-10-CM

## 2023-01-29 DIAGNOSIS — I890 Lymphedema, not elsewhere classified: Secondary | ICD-10-CM | POA: Diagnosis not present

## 2023-01-29 DIAGNOSIS — N183 Chronic kidney disease, stage 3 unspecified: Secondary | ICD-10-CM

## 2023-01-29 DIAGNOSIS — I872 Venous insufficiency (chronic) (peripheral): Secondary | ICD-10-CM | POA: Diagnosis not present

## 2023-01-29 DIAGNOSIS — E1122 Type 2 diabetes mellitus with diabetic chronic kidney disease: Secondary | ICD-10-CM | POA: Diagnosis not present

## 2023-01-29 NOTE — Telephone Encounter (Signed)
Patient was seen today in office and was requesting that the lymph pump she had was wrong and wanted another pump. Patient received a custom pump in January this year. I reached out to [facility] at [facility] to find out what was going on. I receive da call from [place] later stating the patient said her pump didn't work well and she wanted another pump. Patient was told that due to her insurance she would have to pay for another pump to be sent to her. Patient declined.

## 2023-01-29 NOTE — Progress Notes (Signed)
MRN : [id number]  Becky Trevino is a 77 y.o. (05-Nov-1945) female who presents with chief complaint of legs hurt and swell.  History of Present Illness:   The patient returns to the office for followup evaluation regarding leg swelling.  The swelling has persisted and the pain associated with swelling continues. There have not been any interval development of a ulcerations or wounds.  Since the previous visit the patient has been wearing graduated compression wraps on a daily basis and has noted little if any improvement in the lymphedema. The patient has been using compression routinely morning until night.  The patient also states elevation during the day and exercise is being done too.  She does have a lymph pump but the sleeves apparently have an abdominal component and she is not able to apply the sleeves in a manner that is functional.  She states that in the remote past when she had 2 separate leggings this worked much better.  Given this problem she is really not been able to utilize her lymph pump.  Current Meds  Medication Sig   ACCU-CHEK GUIDE test strip TEST ONCE A DAY AS DIRECTED   acetaminophen (TYLENOL) 650 MG CR tablet Take 650-1,300 mg by mouth 2 (two) times daily as needed for pain.   amiodarone (PACERONE) 200 MG tablet Take 200 mg by mouth daily.   apixaban (ELIQUIS) 5 MG TABS tablet Take 1 tablet by mouth 2 (two) times daily.   atorvastatin (LIPITOR) 40 MG tablet Take 1 tablet (40 mg total) by mouth every evening.   blood glucose meter kit and supplies 1 each by Other route daily at 12 noon. Dispense based on patient and insurance preference. Check fsbs once a day   Calcium Carbonate-Vitamin D 600-400 MG-UNIT tablet Take 1 tablet by mouth 2 (two) times daily.   dapagliflozin propanediol (FARXIGA) 10 MG TABS tablet Take 1 tablet (10 mg total) by mouth daily before breakfast. Patient receives via [employer] Patient Assistance   escitalopram (LEXAPRO) 5 MG tablet Take  1 tablet (5 mg total) by mouth daily.   ferrous sulfate 325 (65 FE) MG EC tablet TAKE 1 TABLET BY MOUTH EVERY DAY   fluticasone (FLONASE) 50 MCG/ACT nasal spray SPRAY 2 SPRAYS INTO EACH NOSTRIL EVERY DAY   hydroquinone 4 % cream Apply topically at bedtime.   latanoprost (XALATAN) 0.005 % ophthalmic solution SMARTSIG:In Eye(s)   levothyroxine (SYNTHROID) 50 MCG tablet TAKE 1 TABLET BY MOUTH EVERY DAY   Magnesium Oxide 500 MG CAPS Take 500 mg by mouth daily.    metoprolol succinate (TOPROL-XL) 50 MG 24 hr tablet Take 50 mg by mouth daily.   oxybutynin (DITROPAN-XL) 10 MG 24 hr tablet Take 1 tablet (10 mg total) by mouth daily.   Oxycodone HCl 10 MG TABS Take 1 tablet by mouth 4 (four) times daily as needed.   polyethylene glycol (MIRALAX / GLYCOLAX) 17 g packet Take 17 g by mouth daily. (Patient taking differently: Take 17 g by mouth every other day.)   potassium chloride (KLOR-CON) 10 MEQ tablet Take 10 mEq by mouth daily.   sodium chloride (OCEAN) 0.65 % SOLN nasal spray Place 1 spray into both nostrils 3 (three) times daily.   torsemide (DEMADEX) 20 MG tablet Take 1 tablet (20 mg total) by mouth daily.   traZODone (DESYREL) 50 MG tablet Take 0.5-1 tablets (25-50 mg total) by mouth at bedtime as needed. for sleep    Past Medical History:  Diagnosis Date   Cervical spondylosis    Chronic kidney disease    Stage 3   DDD (degenerative disc disease), cervical    [facility]   Diabetes mellitus without complication (HCC)    Diverticulosis    Hyperlipidemia    Hypertension    Intertrigo    Leg cramps    Leg varices    Obesity    OSA (obstructive sleep apnea)    Symptomatic menopausal or female climacteric states    Syncope     Past Surgical History:  Procedure Laterality Date   ABDOMINAL HYSTERECTOMY  1993   Total   BUNIONECTOMY Bilateral 1993   I & D KNEE WITH POLY EXCHANGE Right 11/19/2017   Procedure: RIGHT KNEE POLY EXCHANGE WITH IRRIGATION AND DEBRIDEMENT;  Surgeon:  Kennedy Bucker, MD;  Location: [facility];  Service: Orthopedics;  Laterality: Right;   IR FLUORO GUIDED NEEDLE PLC ASPIRATION/INJECTION LOC  09/19/2021   JOINT REPLACEMENT     bilateral knee   LAMINECTOMY  11/14/2013    Cervical Fusion , [facility], Dr. Timoteo Ace   LASER ABLATION Bilateral 07/29/2012   Dr. Wyn Quaker   LOWER EXTREMITY ANGIOGRAPHY Right 11/02/2019   Procedure: Lower Extremity Angiography;  Surgeon: Renford Dills, MD;  Location: [facility];  Service: Cardiovascular;  Laterality: Right;   ORIF ANKLE FRACTURE Right 09/02/2019   Procedure: OPEN REDUCTION INTERNAL FIXATION (ORIF) ANKLE FRACTURE BIMALLEOLAR;  Surgeon: Rosetta Posner, DPM;  Location: [facility];  Service: Podiatry;  Laterality: Right;   PERIPHERAL VASCULAR THROMBECTOMY Right 03/02/2020   Procedure: PERIPHERAL VASCULAR THROMBECTOMY;  Surgeon: Annice Needy, MD;  Location: [facility];  Service: Cardiovascular;  Laterality: Right;   REPLACEMENT TOTAL KNEE Left 03/2010   Dr. Rosita Kea   TEE WITHOUT CARDIOVERSION N/A 09/19/2021   Procedure: TRANSESOPHAGEAL ECHOCARDIOGRAM (TEE);  Surgeon: Lamar Blinks, MD;  Location: [facility];  Service: Cardiovascular;  Laterality: N/A;   TOTAL HIP ARTHROPLASTY Left 12/11/2015   Procedure: TOTAL HIP ARTHROPLASTY ANTERIOR APPROACH;  Surgeon: Kennedy Bucker, MD;  Location: [facility];  Service: Orthopedics;  Laterality: Left;   TOTAL KNEE ARTHROPLASTY Right 03/11/2016   Procedure: TOTAL KNEE ARTHROPLASTY;  Surgeon: Kennedy Bucker, MD;  Location: [facility];  Service: Orthopedics;  Laterality: Right;    Social History Social History   Tobacco Use   Smoking status: Never   Smokeless tobacco: Never  Vaping Use   Vaping Use: Never used  Substance Use Topics   Alcohol use: No    Alcohol/week: 0.0 standard drinks of alcohol   Drug use: No    Family History Family History  Problem Relation Age of Onset   Diabetes Mother    Hyperlipidemia Mother    Hypertension Mother    Diabetes Father     Hyperlipidemia Father    Hypertension Father    Obesity Father    Hypertension Sister    Hyperlipidemia Sister    Hyperlipidemia Sister    Breast cancer Neg Hx     Allergies  Allergen Reactions   Lisinopril Swelling     REVIEW OF SYSTEMS (Negative unless checked)  Constitutional: [] Weight loss  [] Fever  [] Chills Cardiac: [] Chest pain   [] Chest pressure   [] Palpitations   [] Shortness of breath when laying flat   [] Shortness of breath with exertion. Vascular:  [] Pain in legs with walking   [x] Pain in legs at rest  [] History of DVT   [] Phlebitis   [x] Swelling in legs   [] Varicose veins   [] Non-healing ulcers Pulmonary:   []   Uses home oxygen   [] Productive cough   [] Hemoptysis   [] Wheeze  [] COPD   [] Asthma Neurologic:  [] Dizziness   [] Seizures   [] History of stroke   [] History of TIA  [] Aphasia   [] Vissual changes   [] Weakness or numbness in arm   [] Weakness or numbness in leg Musculoskeletal:   [] Joint swelling   [] Joint pain   [] Low back pain Hematologic:  [] Easy bruising  [] Easy bleeding   [] Hypercoagulable state   [] Anemic Gastrointestinal:  [] Diarrhea   [] Vomiting  [] Gastroesophageal reflux/heartburn   [] Difficulty swallowing. Genitourinary:  [] Chronic kidney disease   [] Difficult urination  [] Frequent urination   [] Blood in urine Skin:  [] Rashes   [] Ulcers  Psychological:  [] History of anxiety   []  History of major depression.  Physical Examination  Vitals:   01/29/23 0955  BP: 127/74  Pulse: (!) 53  Resp: 18  Weight: 237 lb (107.5 kg)  Height: 5\' 11"  (1.803 m)   Body mass index is 33.05 kg/m. Gen: WD/WN, seen in a wheelchair Head: [place]/AT, No temporalis wasting.  Ear/Nose/Throat: Hearing grossly intact, nares w/o erythema or drainage, pinna without lesions Eyes: PER, EOMI, sclera nonicteric.  Neck: Supple, no gross masses.  No JVD.  Pulmonary:  Good air movement, no audible wheezing, no use of accessory muscles.  Cardiac: RRR, precordium not hyperdynamic. Vascular:   scattered varicosities present bilaterally.  Moderate venous stasis changes to the legs bilaterally.  4+ soft pitting edema. CEAP [id number]   Vessel Right Left  Radial Palpable Palpable  Gastrointestinal: soft, non-distended. No guarding/no peritoneal signs.  Musculoskeletal: M/S 5/5 throughout.  No deformity.  Neurologic: CN 2-12 intact. Pain and light touch intact in extremities.  Symmetrical.  Speech is fluent. Motor exam as listed above. Psychiatric: Judgment intact, Mood & affect appropriate for pt's clinical situation. Dermatologic: Venous rashes no ulcers noted.  No changes consistent with cellulitis. Lymph : No lichenification or skin changes of chronic lymphedema.  CBC Lab Results  Component Value Date   WBC 4.5 12/30/2022   HGB 12.3 12/30/2022   HCT 40.2 12/30/2022   MCV 96.9 12/30/2022   PLT 190 12/30/2022    BMET    Component Value Date/Time   NA 139 11/07/2022 0955   NA 142 11/26/2015 1122   NA 141 10/18/2012 1716   K 4.1 11/07/2022 0955   K 3.5 10/18/2012 1716   CL 101 11/07/2022 0955   CL 110 (H) 10/18/2012 1716   CO2 31 11/07/2022 0955   CO2 24 10/18/2012 1716   GLUCOSE 104 (H) 11/07/2022 0955   GLUCOSE 128 (H) 10/18/2012 1716   BUN 27 (H) 11/07/2022 0955   BUN 27 11/26/2015 1122   BUN 31 (H) 10/18/2012 1716   CREATININE 1.46 (H) 11/07/2022 0955   CREATININE 1.33 (H) 05/30/2022 1117   CALCIUM 8.8 (L) 11/07/2022 0955   CALCIUM 7.9 (L) 10/18/2012 1716   GFRNONAA 37 (L) 11/07/2022 0955   GFRNONAA 41 (L) 02/04/2021 1123   GFRAA 48 (L) 02/04/2021 1123   CrCl cannot be calculated (Patient's most recent lab result is older than the maximum 21 days allowed.).  COAG Lab Results  Component Value Date   INR 1.8 (H) 09/19/2021   INR 1.2 07/15/2021   INR 1.1 05/01/2020    Radiology VAS [place] LOWER EXTREMITY VENOUS REFLUX  Result Date: 01/15/2023  Lower Venous Reflux Study Patient Name:  NASHELY BEATH  Date of Exam:   01/14/2023 Medical Rec #: [id number]  Accession #:    [id number] Date of Birth: August 24, 1946        Patient Gender: F Patient Age:   69 years Exam Location:  [facility] Procedure:      VAS [place] LOWER EXTREMITY VENOUS REFLUX Referring Phys: Sheppard Plumber --------------------------------------------------------------------------------  Indications: Swelling, and Edema.  Risk Factors: DVT History of bilateral chronic DVT. Anticoagulation: Eliquis. Limitations: Body habitus. Comparison Study: Prior reflux study showed bilateral CFV & SFJ reflux and also                   reflux in the left femoral, popliteal and GSV Performing Technologist: Hardie Lora RVT  Examination Guidelines: A complete evaluation includes B-mode imaging, spectral Doppler, color Doppler, and power Doppler as needed of all accessible portions of each vessel. Bilateral testing is considered an integral part of a complete examination. Limited examinations for reoccurring indications may be performed as noted. The reflux portion of the exam is performed with the patient in reverse Trendelenburg. Significant venous reflux is defined as >500 ms in the superficial venous system, and >1 second in the deep venous system.  Venous Reflux Times +--------------+---------+------+-----------+------------+--------+ RIGHT         Reflux NoRefluxReflux TimeDiameter cmsComments                         Yes                                  +--------------+---------+------+-----------+------------+--------+ CFV                     yes   >1 second                      +--------------+---------+------+-----------+------------+--------+ FV prox       no                                             +--------------+---------+------+-----------+------------+--------+ FV mid        no                                             +--------------+---------+------+-----------+------------+--------+ FV dist       no                                              +--------------+---------+------+-----------+------------+--------+ Popliteal     no                                             +--------------+---------+------+-----------+------------+--------+ GSV at SFJ              yes    >500 ms      0.79             +--------------+---------+------+-----------+------------+--------+ GSV prox thigh          yes    >500 ms      0.75             +--------------+---------+------+-----------+------------+--------+  GSV mid thigh           yes    >500 ms      0.59             +--------------+---------+------+-----------+------------+--------+ GSV dist thigh          yes    >500 ms      0.67             +--------------+---------+------+-----------+------------+--------+ GSV at knee             yes    >500 ms      0.51             +--------------+---------+------+-----------+------------+--------+ GSV prox calf no                            0.31             +--------------+---------+------+-----------+------------+--------+ SSV Pop Fossa no                            0.60             +--------------+---------+------+-----------+------------+--------+ SSV prox calf no                            0.70             +--------------+---------+------+-----------+------------+--------+ SSV mid calf  no                            0.65             +--------------+---------+------+-----------+------------+--------+  +--------------+---------+------+-----------+------------+--------+ LEFT          Reflux NoRefluxReflux TimeDiameter cmsComments                         Yes                                  +--------------+---------+------+-----------+------------+--------+ CFV                     yes   >1 second                      +--------------+---------+------+-----------+------------+--------+ FV prox                 yes   >1 second                       +--------------+---------+------+-----------+------------+--------+ FV mid                  yes   >1 second                      +--------------+---------+------+-----------+------------+--------+ FV dist                 yes   >1 second                      +--------------+---------+------+-----------+------------+--------+ Popliteal               yes   >1 second                      +--------------+---------+------+-----------+------------+--------+ GSV at  SFJ              yes    >500 ms      0.57             +--------------+---------+------+-----------+------------+--------+ GSV prox thigh          yes    >500 ms      0.56             +--------------+---------+------+-----------+------------+--------+ GSV mid thigh           yes    >500 ms      0.63             +--------------+---------+------+-----------+------------+--------+ GSV dist thigh          yes    >500 ms      0.54             +--------------+---------+------+-----------+------------+--------+ GSV at knee             yes    >500 ms      0.55             +--------------+---------+------+-----------+------------+--------+ GSV prox calf           yes    >500 ms      0.61             +--------------+---------+------+-----------+------------+--------+ SSV Pop Fossa no                            0.65             +--------------+---------+------+-----------+------------+--------+ SSV prox calf no                            0.47             +--------------+---------+------+-----------+------------+--------+ SSV mid calf  no                            0.58             +--------------+---------+------+-----------+------------+--------+   Summary: Right: - No evidence of superficial venous thrombosis in the right lower extremity. - No evidence of superficial venous reflux seen in the right short saphenous vein. - Color duplex evaluation of the right lower extremity shows there is  thrombus in the common femoral vein. - Venous reflux is noted in the right common femoral vein. - Venous reflux is noted in the right sapheno-femoral junction. - Venous reflux is noted in the right greater saphenous vein in the thigh. - Chronic thrombus CFV/ SFJ  Left: - No evidence of superficial venous thrombosis in the left lower extremity. - No evidence of superficial venous reflux seen in the left short saphenous vein. - Color duplex evaluation of the left lower extremity shows there is thrombus in the common femoral vein. - Venous reflux is noted in the left common femoral vein. - Venous reflux is noted in the left sapheno-femoral junction. - Venous reflux is noted in the left greater saphenous vein in the thigh. - Venous reflux is noted in the left greater saphenous vein in the calf. - Venous reflux is noted in the left femoral vein. - Venous reflux is noted in the left popliteal vein. - Chronic thrombus CFV/ SFJ  *See table(s) above for measurements and observations. Electronically signed by Levora Dredge MD on 01/15/2023 at 10:06:03 AM.    Final  Assessment/Plan 1. Lymphedema Recommend:  No surgery or intervention at this point in time.    I have reviewed my discussion with the patient regarding lymphedema and why it  causes symptoms.  Patient will continue wearing graduated compression on a daily basis. The patient should put the compression on first thing in the morning and removing them in the evening. The patient should not sleep in the compression.  Furthermore we discussed adding both a knee and thigh compression wrap to her system at least on the right side which is the more problematic leg.  In addition, behavioral modification throughout the day will be continued.  This will include frequent elevation (such as in a recliner), use of over the counter pain medications as needed and exercise such as walking.  The systemic causes for chronic edema such as liver, kidney and cardiac  etiologies does not appear to have significant changed over the past year.    The patient will continue aggressive use of the  lymph pump if we can get the old style leggings.  We will look into seeing if we can get replacement sleeves that would be better fitted.  This will continue to improve the edema control and prevent sequela such as ulcers and infections.   The patient will follow-up with me in 6 months.    A total of 30 minutes was spent with this patient and greater than 50% was spent in counseling and coordination of care with the patient.  Discussion included the treatment options for vascular disease including indications for surgery and intervention.  Also discussed is the appropriate timing of treatment.  In addition medical therapy was discussed.  2. Chronic venous insufficiency See #1  3. Essential hypertension Continue antihypertensive medications as already ordered, these medications have been reviewed and there are no changes at this time.  4. AF (paroxysmal atrial fibrillation) (HCC) Continue antiarrhythmia medications as already ordered, these medications have been reviewed and there are no changes at this time.  Continue anticoagulation as ordered by Cardiology Service  5. Type 2 diabetes mellitus with stage 3 chronic kidney disease, without long-term current use of insulin, unspecified whether stage 3a or 3b CKD (HCC) Continue hypoglycemic medications as already ordered, these medications have been reviewed and there are no changes at this time.  Hgb A1C to be monitored as already arranged by primary service  6. Mixed hyperlipidemia Continue statin as ordered and reviewed, no changes at this time    Levora Dredge, MD  01/29/2023 10:12 AM

## 2023-02-02 ENCOUNTER — Other Ambulatory Visit: Payer: Self-pay | Admitting: Family Medicine

## 2023-02-02 DIAGNOSIS — G4709 Other insomnia: Secondary | ICD-10-CM

## 2023-02-12 DIAGNOSIS — H40153 Residual stage of open-angle glaucoma, bilateral: Secondary | ICD-10-CM | POA: Diagnosis not present

## 2023-02-19 ENCOUNTER — Telehealth (INDEPENDENT_AMBULATORY_CARE_PROVIDER_SITE_OTHER): Payer: Self-pay

## 2023-02-19 NOTE — Telephone Encounter (Signed)
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  Or is she getting new compression garments from [facility] ?

## 2023-02-20 NOTE — Telephone Encounter (Signed)
I spoke with the pt and she states that she is confused about what she needs.  I will speak to Dr Gilda Crease on Monday and find out exactly what he wants her to have and give her a call back.  Pt states understanding.

## 2023-02-26 NOTE — Telephone Encounter (Signed)
I spoke with Dr Gilda Crease and he wants the pt to have thigh high compression wraps.  I called pt and let her know that I sent the Rx over to [facility].  Pt states verbal understanding.

## 2023-02-27 NOTE — Progress Notes (Unsigned)
Name: Becky Trevino   MRN: [id number]    DOB: Feb 23, 1946   Date:03/02/2023       Progress Note  Subjective  Chief Complaint  Follow Up  HPI  DMII with renal manifestation and obesity:  she denies polyuria, polyphagia or polydipsia. A1C is normal She has CKI - last GFR dropped a little while at [facility] down to 37 but she was also slightly dehydrated ( lab is closed now) She  has  dyslipidemia and obesity . She cannot take ACE due to angioedema, A1C is at goal, taking [place] for CKI . She states statins for dyslipidemia. She has neuropathy and she is up to date with visits with podiatrist.   Dysphagia:she is going to see Dr. Tobi Bastos soon    Chronic DVT  right popliteal vein: diagnosed 10/27/2019,  she has an IV filter. Dr. Cathie Hoops had stopped Eliquis but had psoas hematoma but since resolution of hematoma Dr. Juliann Pares placed her back on it and no side effects  Hyperlipidemia: taking Atorvastatin daily and denies side effects. No chest pain , no muscles pain. Last LDL was at goal .We will recheck labs today    Insomnia/OSA :Last Echo done by Dr. Juliann Pares showed mild pulmonary hypertension. She is trying to get a new CPAP machine but forms need to be filled out.   OA: multiple joints, sees Ortho, Dr. Rosita Kea . She had revision of right knee replacement March 2019 because of infection. Still under the care of Ortho now also seeing Dr. Owens Loffler . She is still on daily antibiotics - Amoxicillin - denies side effects    Left femoral nerve palsy:  seen by Dr. Rosita Kea, Dr. Sherryll Burger, now seeing neurosurgeon at [facility] , Dr. Katrinka Blazing. She is able to walk short distances but s wearing brace because left knee is unstable due to femoral nerve palsy. She still  needs assistance bathing, cannot drive, cannot transfer alone . She was diagnosed with a hematoma of left iliopsoas during hospital stay 10/2019 , but she had repeat  CT pelvis 01/09/2020 that showed resolution of hematoma. She is back on Eliquis and off aspirin now  Dr. Katrinka Blazing  reviewed her case and advised against doing the surgery to decompress the nerve. She continues to have lower extremity edema that improves with massage She went to vascular surgeon and she is wearing compression pumps twice daily , also has leg wraps and will have a for her right thigh soon .    CKI stage III: we will recheck labs today, she has good urine output    Atherosclerosis of Aorta/Afib/PAD: she is on statin and back on Eliquis Denies side effects Last LDL at goal . Rate controlled . She is compliant with visits to cardiologist - Dr. Juliann Pares She does not have claudication symptoms since only walks a little inside the house . Unchanged , continue statin therapy   Hypothyroidism: on levothyroxine 50 mcg daily, last TSH at goal, we will recheck it today. Her hair is still thinning out   Anxiety/dysthymia : she is on lexapro, making a point of sitting outside in the sun. She states no longer having  panic attacks. . She also goes on rides on her wheelchair while her sister walks Doing well    Patient Active Problem List   Diagnosis Date Noted   Acquired absence of right leg above knee (HCC) 12/01/2022   Interstitial lung disease (HCC) 12/01/2022   Atherosclerosis of native arteries of extremity with intermittent claudication (HCC) 10/16/2022   Anemia  due to chronic kidney disease 08/26/2022   Hypothyroidism (acquired) 01/27/2022   PAD (peripheral artery disease) (HCC) 09/27/2021   AF (paroxysmal atrial fibrillation) (HCC) 09/15/2021   Primary osteoarthritis of right hip 07/18/2021   Atherosclerosis of aorta (HCC) 06/07/2021   Mild protein-calorie malnutrition (HCC)    Hyperbilirubinemia    Injury of left femoral nerve 08/28/2020   Lymphedema 06/25/2020   History of pelvic hematoma 02/10/2020   Chronic venous hypertension due to deep vein thrombosis (DVT) 10/30/2019   Normocytic anemia 08/07/2019   Chronic infection of right knee (HCC) 11/25/2018   Infection and inflammatory  reaction due to internal joint prosthesis, subsequent encounter 01/25/2018   Infection of total right knee replacement (HCC) 11/19/2017   Type 2 diabetes mellitus with stage 3b chronic kidney disease, without long-term current use of insulin (HCC) 11/06/2016   Primary osteoarthritis of knee 03/11/2016   Primary osteoarthritis of left hip 12/11/2015   Essential hypertension 04/21/2015   Chronic kidney disease (CKD), stage III (moderate) (HCC) 04/21/2015   Chronic venous insufficiency 04/21/2015   DDD (degenerative disc disease), cervical 04/21/2015   Diabetes mellitus with renal manifestation (HCC) 04/21/2015   Hyperlipidemia 04/21/2015   Bladder cystocele 04/21/2015   Urinary incontinence 04/21/2015   Leg varices 04/21/2015   Insomnia 04/21/2015   Eczema intertrigo 04/21/2015   Obesity, Class III, BMI 40-49.9 (morbid obesity) (HCC) 04/21/2015   OSA (obstructive sleep apnea) 04/21/2015   Osteoarthritis of multiple joints 04/21/2015   H/O Spinal surgery 11/14/2013   Detrusor muscle hypertonia 11/07/2013    Past Surgical History:  Procedure Laterality Date   ABDOMINAL HYSTERECTOMY  1993   Total   BUNIONECTOMY Bilateral 1993   I & D KNEE WITH POLY EXCHANGE Right 11/19/2017   Procedure: RIGHT KNEE POLY EXCHANGE WITH IRRIGATION AND DEBRIDEMENT;  Surgeon: Kennedy Bucker, MD;  Location: [facility];  Service: Orthopedics;  Laterality: Right;   IR FLUORO GUIDED NEEDLE PLC ASPIRATION/INJECTION LOC  09/19/2021   JOINT REPLACEMENT     bilateral knee   LAMINECTOMY  11/14/2013    Cervical Fusion , [facility], Dr. Timoteo Ace   LASER ABLATION Bilateral 07/29/2012   Dr. Wyn Quaker   LOWER EXTREMITY ANGIOGRAPHY Right 11/02/2019   Procedure: Lower Extremity Angiography;  Surgeon: Renford Dills, MD;  Location: [facility];  Service: Cardiovascular;  Laterality: Right;   ORIF ANKLE FRACTURE Right 09/02/2019   Procedure: OPEN REDUCTION INTERNAL FIXATION (ORIF) ANKLE FRACTURE BIMALLEOLAR;  Surgeon: Rosetta Posner,  DPM;  Location: [facility];  Service: Podiatry;  Laterality: Right;   PERIPHERAL VASCULAR THROMBECTOMY Right 03/02/2020   Procedure: PERIPHERAL VASCULAR THROMBECTOMY;  Surgeon: Annice Needy, MD;  Location: [facility];  Service: Cardiovascular;  Laterality: Right;   REPLACEMENT TOTAL KNEE Left 03/2010   Dr. Rosita Kea   TEE WITHOUT CARDIOVERSION N/A 09/19/2021   Procedure: TRANSESOPHAGEAL ECHOCARDIOGRAM (TEE);  Surgeon: Lamar Blinks, MD;  Location: [facility];  Service: Cardiovascular;  Laterality: N/A;   TOTAL HIP ARTHROPLASTY Left 12/11/2015   Procedure: TOTAL HIP ARTHROPLASTY ANTERIOR APPROACH;  Surgeon: Kennedy Bucker, MD;  Location: [facility];  Service: Orthopedics;  Laterality: Left;   TOTAL KNEE ARTHROPLASTY Right 03/11/2016   Procedure: TOTAL KNEE ARTHROPLASTY;  Surgeon: Kennedy Bucker, MD;  Location: [facility];  Service: Orthopedics;  Laterality: Right;    Family History  Problem Relation Age of Onset   Diabetes Mother    Hyperlipidemia Mother    Hypertension Mother    Diabetes Father    Hyperlipidemia Father  Hypertension Father    Obesity Father    Hypertension Sister    Hyperlipidemia Sister    Hyperlipidemia Sister    Breast cancer Neg Hx     Social History   Tobacco Use   Smoking status: Never   Smokeless tobacco: Never  Substance Use Topics   Alcohol use: No    Alcohol/week: 0.0 standard drinks of alcohol     Current Outpatient Medications:    ACCU-CHEK GUIDE test strip, TEST ONCE A DAY AS DIRECTED, Disp: 50 strip, Rfl: 1   acetaminophen (TYLENOL) 650 MG CR tablet, Take 650-1,300 mg by mouth 2 (two) times daily as needed for pain., Disp: , Rfl:    amiodarone (PACERONE) 200 MG tablet, Take 200 mg by mouth daily., Disp: , Rfl:    apixaban (ELIQUIS) 5 MG TABS tablet, Take 1 tablet by mouth 2 (two) times daily., Disp: , Rfl:    atorvastatin (LIPITOR) 40 MG tablet, Take 1 tablet (40 mg total) by mouth every evening., Disp: 90 tablet, Rfl: 1   blood glucose meter kit  and supplies, 1 each by Other route daily at 12 noon. Dispense based on patient and insurance preference. Check fsbs once a day, Disp: 1 each, Rfl: 0   Calcium Carbonate-Vitamin D 600-400 MG-UNIT tablet, Take 1 tablet by mouth 2 (two) times daily., Disp: 60 tablet, Rfl: 0   dapagliflozin propanediol (FARXIGA) 10 MG TABS tablet, Take 1 tablet (10 mg total) by mouth daily before breakfast. Patient receives via [employer] Patient Assistance, Disp: 90 tablet, Rfl: 3   escitalopram (LEXAPRO) 5 MG tablet, Take 1 tablet (5 mg total) by mouth daily., Disp: 90 tablet, Rfl: 1   ferrous sulfate 325 (65 FE) MG EC tablet, TAKE 1 TABLET BY MOUTH EVERY DAY, Disp: 90 tablet, Rfl: 3   fluticasone (FLONASE) 50 MCG/ACT nasal spray, SPRAY 2 SPRAYS INTO EACH NOSTRIL EVERY DAY, Disp: 48 mL, Rfl: 0   hydroquinone 4 % cream, Apply topically at bedtime., Disp: , Rfl:    latanoprost (XALATAN) 0.005 % ophthalmic solution, SMARTSIG:In Eye(s), Disp: , Rfl:    levothyroxine (SYNTHROID) 50 MCG tablet, TAKE 1 TABLET BY MOUTH EVERY DAY, Disp: 90 tablet, Rfl: 0   Magnesium Oxide 500 MG CAPS, Take 500 mg by mouth daily. , Disp: , Rfl:    metoprolol succinate (TOPROL-XL) 50 MG 24 hr tablet, Take 50 mg by mouth daily., Disp: , Rfl:    oxybutynin (DITROPAN-XL) 10 MG 24 hr tablet, Take 1 tablet (10 mg total) by mouth daily., Disp: 30 tablet, Rfl: 6   Oxycodone HCl 10 MG TABS, Take 1 tablet by mouth 4 (four) times daily as needed., Disp: , Rfl:    polyethylene glycol (MIRALAX / GLYCOLAX) 17 g packet, Take 17 g by mouth daily. (Patient taking differently: Take 17 g by mouth every other day.), Disp: 14 each, Rfl: 0   potassium chloride (KLOR-CON) 10 MEQ tablet, Take 10 mEq by mouth daily., Disp: , Rfl:    sodium chloride (OCEAN) 0.65 % SOLN nasal spray, Place 1 spray into both nostrils 3 (three) times daily., Disp: 30 mL, Rfl: 0   torsemide (DEMADEX) 20 MG tablet, Take 1 tablet (20 mg total) by mouth daily., Disp: 90 tablet, Rfl: 1   traZODone  (DESYREL) 50 MG tablet, TAKE 0.5-1 TABLETS (25-50 MG TOTAL) BY MOUTH AT BEDTIME AS NEEDED. FOR SLEEP, Disp: 90 tablet, Rfl: 0  Allergies  Allergen Reactions   Lisinopril Swelling    I personally reviewed active problem list,  medication list, allergies, family history, social history, health maintenance with the patient/caregiver today.   ROS  Ten systems reviewed and is negative except as mentioned in HPI   Objective  Vitals:   03/02/23 0947  BP: 132/78  Pulse: (!) 58  Resp: 16  SpO2: 95%  Weight: 241 lb (109.3 kg)  Height: 5\' 11"  (1.803 m)    Body mass index is 33.61 kg/m.  Physical Exam  Constitutional: Patient appears well-developed and well-nourished. Obese  No distress.  HEENT: head atraumatic, normocephalic, pupils equal and reactive to light, neck supple Cardiovascular: Normal rate, regular rhythm and normal heart sounds.  No murmur heard.  BLE edema, right thigh , wears a wrap on both lower extremities . Pulmonary/Chest: Effort normal and breath sounds normal. No respiratory distress. Abdominal: Soft.  There is no tenderness. Psychiatric: Patient has a normal mood and affect. behavior is normal. Judgment and thought content normal.      PHQ2/9:    03/02/2023    9:48 AM 12/01/2022   11:42 AM 08/29/2022    2:54 PM 08/05/2022   10:25 AM 06/25/2022    8:41 AM  Depression screen PHQ 2/9  Decreased Interest 0 0 0 0 0  Down, Depressed, Hopeless 0 0 0 0 0  PHQ - 2 Score 0 0 0 0 0  Altered sleeping 0 0 0  0  Tired, decreased energy 0 0 0  0  Change in appetite 0 0 0  0  Feeling bad or failure about yourself  0 0 0  0  Trouble concentrating 0 0 0  0  Moving slowly or fidgety/restless 0 0 0  0  Suicidal thoughts 0 0 0  0  PHQ-9 Score 0 0 0  0    phq 9 is negative   Fall Risk:    03/02/2023    9:48 AM 12/01/2022   11:42 AM 08/29/2022    2:54 PM 08/05/2022   10:26 AM 06/25/2022    8:41 AM  Fall Risk   Falls in the past year? 0 0 0 0 0  Number falls in  past yr: 0 0  0   Injury with Fall? 0 0  0   Risk for fall due to : Impaired mobility;Impaired balance/gait Impaired balance/gait;Impaired mobility No Fall Risks No Fall Risks Impaired balance/gait;Impaired mobility  Follow up Falls prevention discussed Falls prevention discussed Falls prevention discussed;Education provided;Falls evaluation completed Falls evaluation completed Falls prevention discussed;Education provided      Functional Status Survey: Is the patient deaf or have difficulty hearing?: No Does the patient have difficulty seeing, even when wearing glasses/contacts?: No Does the patient have difficulty concentrating, remembering, or making decisions?: No Does the patient have difficulty walking or climbing stairs?: Yes Does the patient have difficulty dressing or bathing?: No Does the patient have difficulty doing errands alone such as visiting a doctor's office or shopping?: No    Assessment & Plan   1. Type 2 diabetes mellitus with diabetic neuropathy, without long-term current use of insulin (HCC)  - POCT HgB A1C - Urine Microalbumin w/creat. ratio - HM Diabetes Foot Exam  2. Stage 3b chronic kidney disease (HCC)  - COMPLETE METABOLIC PANEL WITH GFR - VITAMIN D 25 Hydroxy (Vit-D Deficiency, Fractures)  3. AF (paroxysmal atrial fibrillation) (HCC)  Rate controlled, under the care of Dr. Juliann Pares   4. Chronic deep vein thrombosis (DVT) of tibial vein of right lower extremity (HCC)  On Eliquis   5. PAD (peripheral artery disease) (  HCC)  Under the care of vascular surgeon   6. Atherosclerosis of aorta (HCC)  - Lipid panel  7. Pulmonary hypertension (HCC)  Under the care of Dr. Juliann Pares   8. Obesity   BMI is below 35 now  9. Chronic infection of right knee (HCC)  Stable, under the care of Ortho   10. Hypothyroidism (acquired)  - TSH

## 2023-03-02 ENCOUNTER — Encounter: Payer: Self-pay | Admitting: Family Medicine

## 2023-03-02 ENCOUNTER — Ambulatory Visit (INDEPENDENT_AMBULATORY_CARE_PROVIDER_SITE_OTHER): Payer: Medicare Other | Admitting: Family Medicine

## 2023-03-02 VITALS — BP 132/78 | HR 58 | Resp 16 | Ht 71.0 in | Wt 241.0 lb

## 2023-03-02 DIAGNOSIS — I739 Peripheral vascular disease, unspecified: Secondary | ICD-10-CM

## 2023-03-02 DIAGNOSIS — E669 Obesity, unspecified: Secondary | ICD-10-CM

## 2023-03-02 DIAGNOSIS — I82541 Chronic embolism and thrombosis of right tibial vein: Secondary | ICD-10-CM | POA: Diagnosis not present

## 2023-03-02 DIAGNOSIS — E039 Hypothyroidism, unspecified: Secondary | ICD-10-CM | POA: Diagnosis not present

## 2023-03-02 DIAGNOSIS — E1140 Type 2 diabetes mellitus with diabetic neuropathy, unspecified: Secondary | ICD-10-CM | POA: Diagnosis not present

## 2023-03-02 DIAGNOSIS — Z7984 Long term (current) use of oral hypoglycemic drugs: Secondary | ICD-10-CM

## 2023-03-02 DIAGNOSIS — M009 Pyogenic arthritis, unspecified: Secondary | ICD-10-CM

## 2023-03-02 DIAGNOSIS — I272 Pulmonary hypertension, unspecified: Secondary | ICD-10-CM

## 2023-03-02 DIAGNOSIS — I480 Paroxysmal atrial fibrillation: Secondary | ICD-10-CM

## 2023-03-02 DIAGNOSIS — I700 Atherosclerosis of aorta: Secondary | ICD-10-CM

## 2023-03-02 DIAGNOSIS — N1832 Chronic kidney disease, stage 3b: Secondary | ICD-10-CM | POA: Diagnosis not present

## 2023-03-02 LAB — POCT GLYCOSYLATED HEMOGLOBIN (HGB A1C): Hemoglobin A1C: 5.5 % (ref 4.0–5.6)

## 2023-03-03 ENCOUNTER — Other Ambulatory Visit: Payer: Self-pay | Admitting: Family Medicine

## 2023-03-03 DIAGNOSIS — I700 Atherosclerosis of aorta: Secondary | ICD-10-CM | POA: Diagnosis not present

## 2023-03-03 DIAGNOSIS — N1832 Chronic kidney disease, stage 3b: Secondary | ICD-10-CM | POA: Diagnosis not present

## 2023-03-03 DIAGNOSIS — J3089 Other allergic rhinitis: Secondary | ICD-10-CM

## 2023-03-03 DIAGNOSIS — E1140 Type 2 diabetes mellitus with diabetic neuropathy, unspecified: Secondary | ICD-10-CM | POA: Diagnosis not present

## 2023-03-03 DIAGNOSIS — E039 Hypothyroidism, unspecified: Secondary | ICD-10-CM | POA: Diagnosis not present

## 2023-03-03 LAB — COMPLETE METABOLIC PANEL WITH GFR
AG Ratio: 0.8 (calc) — ABNORMAL LOW (ref 1.0–2.5)
ALT: 13 U/L (ref 6–29)
AST: 20 U/L (ref 10–35)
Albumin: 3.4 g/dL — ABNORMAL LOW (ref 3.6–5.1)
Alkaline phosphatase (APISO): 99 U/L (ref 37–153)
BUN/Creatinine Ratio: 15 (calc) (ref 6–22)
BUN: 21 mg/dL (ref 7–25)
CO2: 32 mmol/L (ref 20–32)
Calcium: 9.1 mg/dL (ref 8.6–10.4)
Chloride: 104 mmol/L (ref 98–110)
Creat: 1.43 mg/dL — ABNORMAL HIGH (ref 0.60–1.00)
Globulin: 4.5 g/dL (calc) — ABNORMAL HIGH (ref 1.9–3.7)
Glucose, Bld: 75 mg/dL (ref 65–99)
Potassium: 4.6 mmol/L (ref 3.5–5.3)
Sodium: 141 mmol/L (ref 135–146)
Total Bilirubin: 0.5 mg/dL (ref 0.2–1.2)
Total Protein: 7.9 g/dL (ref 6.1–8.1)
eGFR: 38 mL/min/{1.73_m2} — ABNORMAL LOW (ref >=60)

## 2023-03-03 LAB — LIPID PANEL
Cholesterol: 147 mg/dL (ref <200)
HDL: 60 mg/dL (ref >=50)
LDL Cholesterol (Calc): 75 mg/dL (calc)
Non-HDL Cholesterol (Calc): 87 mg/dL (calc) (ref <130)
Total CHOL/HDL Ratio: 2.5 (calc) (ref <5.0)
Triglycerides: 43 mg/dL (ref <150)

## 2023-03-03 LAB — TSH: TSH: 6.16 mIU/L — ABNORMAL HIGH (ref 0.40–4.50)

## 2023-03-03 LAB — VITAMIN D 25 HYDROXY (VIT D DEFICIENCY, FRACTURES): Vit D, 25-Hydroxy: 46 ng/mL (ref 30–100)

## 2023-03-04 ENCOUNTER — Other Ambulatory Visit: Payer: Self-pay | Admitting: Family Medicine

## 2023-03-04 DIAGNOSIS — E039 Hypothyroidism, unspecified: Secondary | ICD-10-CM

## 2023-03-04 LAB — MICROALBUMIN / CREATININE URINE RATIO
Creatinine, Urine: 112 mg/dL (ref 20–275)
Microalb Creat Ratio: 2 mg/g creat (ref <30)
Microalb, Ur: 0.2 mg/dL

## 2023-03-04 MED ORDER — LEVOTHYROXINE SODIUM 50 MCG PO TABS: 50.0000 ug | ORAL_TABLET | Freq: Every day | ORAL | 0 refills | Status: DC

## 2023-03-14 ENCOUNTER — Other Ambulatory Visit: Payer: Self-pay | Admitting: Family Medicine

## 2023-03-14 DIAGNOSIS — F419 Anxiety disorder, unspecified: Secondary | ICD-10-CM

## 2023-03-23 ENCOUNTER — Telehealth: Payer: Self-pay | Admitting: Family Medicine

## 2023-03-23 NOTE — Telephone Encounter (Signed)
Patient notified we do not have Eliquis samples.

## 2023-03-23 NOTE — Telephone Encounter (Signed)
Pt would like to know if we could get her samples of Eliqus 5mg . She is out and her pharmacy currently do not have it in

## 2023-03-30 ENCOUNTER — Encounter: Payer: Self-pay | Admitting: Gastroenterology

## 2023-03-30 ENCOUNTER — Encounter: Payer: Self-pay | Admitting: Oncology

## 2023-03-30 ENCOUNTER — Ambulatory Visit: Payer: Medicare Other | Admitting: Gastroenterology

## 2023-03-30 ENCOUNTER — Other Ambulatory Visit: Payer: Self-pay

## 2023-03-30 ENCOUNTER — Telehealth: Payer: Self-pay

## 2023-03-30 VITALS — BP 123/62 | HR 53 | Temp 97.7°F | Ht 71.0 in

## 2023-03-30 DIAGNOSIS — R1319 Other dysphagia: Secondary | ICD-10-CM

## 2023-03-30 DIAGNOSIS — K449 Diaphragmatic hernia without obstruction or gangrene: Secondary | ICD-10-CM | POA: Diagnosis not present

## 2023-03-30 DIAGNOSIS — K219 Gastro-esophageal reflux disease without esophagitis: Secondary | ICD-10-CM | POA: Diagnosis not present

## 2023-03-30 MED ORDER — OMEPRAZOLE 40 MG PO CPDR: 40.0000 mg | DELAYED_RELEASE_CAPSULE | Freq: Every day | ORAL | 0 refills | Status: DC

## 2023-03-30 NOTE — Progress Notes (Signed)
[name] MD, MRCP(U.K) [address]  [address]  [place], [place] [address]  Main: [phone]  Fax: [phone]   Gastroenterology Consultation  Referring Provider:     Alba Cory, MD Primary Care Physician:  Alba Cory, MD Primary Gastroenterologist:  Dr. [name]  Reason for Consultation:  Dysphagia        HPI:   Becky Trevino is a 76 y.o. y/o female referred for consultation & management  by Dr. Carlynn Purl, Danna Hefty, MD.    She has been referred for dysphagia. Recent upper GI series in 12/11/2022 showed severe dysmotility with tertiary contractions. No obstruction seen to passage of barium tablet. Moderate sized hiatal hernia with narrowiong at GE junction.   She states she is having difficulty swallowing for the past few weeks affect solids more than liquids points to her upper part of her chest where the food gets stuck sometimes has to throw up.  Denies any heartburn denies any regurgitation denies any coughing.  No use of PPI.  No other complaints.  She is on Eliquis for atrial fibrillation  12/30/2022: Hb 12.3 grams, normal iron studies  Past Medical History:  Diagnosis Date   Cervical spondylosis    Chronic kidney disease    Stage 3   DDD (degenerative disc disease), cervical    [facility]   Diabetes mellitus without complication (HCC)    Diverticulosis    Hyperlipidemia    Hypertension    Intertrigo    Leg cramps    Leg varices    Obesity    OSA (obstructive sleep apnea)    Symptomatic menopausal or female climacteric states    Syncope     Past Surgical History:  Procedure Laterality Date   ABDOMINAL HYSTERECTOMY  1993   Total   BUNIONECTOMY Bilateral 1993   I & D KNEE WITH POLY EXCHANGE Right 11/19/2017   Procedure: RIGHT KNEE POLY EXCHANGE WITH IRRIGATION AND DEBRIDEMENT;  Surgeon: Kennedy Bucker, MD;  Location: [facility];  Service: Orthopedics;  Laterality: Right;   IR FLUORO GUIDED NEEDLE PLC ASPIRATION/INJECTION LOC  09/19/2021    JOINT REPLACEMENT     bilateral knee   LAMINECTOMY  11/14/2013    Cervical Fusion , [facility], Dr. Timoteo Ace   LASER ABLATION Bilateral 07/29/2012   Dr. Wyn Quaker   LOWER EXTREMITY ANGIOGRAPHY Right 11/02/2019   Procedure: Lower Extremity Angiography;  Surgeon: Renford Dills, MD;  Location: [facility];  Service: Cardiovascular;  Laterality: Right;   ORIF ANKLE FRACTURE Right 09/02/2019   Procedure: OPEN REDUCTION INTERNAL FIXATION (ORIF) ANKLE FRACTURE BIMALLEOLAR;  Surgeon: Rosetta Posner, DPM;  Location: [facility];  Service: Podiatry;  Laterality: Right;   PERIPHERAL VASCULAR THROMBECTOMY Right 03/02/2020   Procedure: PERIPHERAL VASCULAR THROMBECTOMY;  Surgeon: Annice Needy, MD;  Location: [facility];  Service: Cardiovascular;  Laterality: Right;   REPLACEMENT TOTAL KNEE Left 03/2010   Dr. Rosita Kea   TEE WITHOUT CARDIOVERSION N/A 09/19/2021   Procedure: TRANSESOPHAGEAL ECHOCARDIOGRAM (TEE);  Surgeon: Lamar Blinks, MD;  Location: [facility];  Service: Cardiovascular;  Laterality: N/A;   TOTAL HIP ARTHROPLASTY Left 12/11/2015   Procedure: TOTAL HIP ARTHROPLASTY ANTERIOR APPROACH;  Surgeon: Kennedy Bucker, MD;  Location: [facility];  Service: Orthopedics;  Laterality: Left;   TOTAL KNEE ARTHROPLASTY Right 03/11/2016   Procedure: TOTAL KNEE ARTHROPLASTY;  Surgeon: Kennedy Bucker, MD;  Location: [facility];  Service: Orthopedics;  Laterality: Right;    Prior to Admission medications   Medication Sig Start  Date End Date Taking? Authorizing Provider  ACCU-CHEK GUIDE test strip TEST ONCE A DAY AS DIRECTED 07/28/22   Alba Cory, MD  acetaminophen (TYLENOL) 650 MG CR tablet Take 650-1,300 mg by mouth 2 (two) times daily as needed for pain.    [provider]  amiodarone (PACERONE) 200 MG tablet Take 200 mg by mouth daily. 07/21/21   Callwood, Dwayne D, MD  apixaban (ELIQUIS) 5 MG TABS tablet Take 1 tablet by mouth 2 (two) times daily. 07/31/21   Callwood, Dwayne D, MD  atorvastatin (LIPITOR)  40 MG tablet Take 1 tablet (40 mg total) by mouth every evening. 12/01/22   Alba Cory, MD  blood glucose meter kit and supplies 1 each by Other route daily at 12 noon. Dispense based on patient and insurance preference. Check fsbs once a day 10/07/21   Alba Cory, MD  Calcium Carbonate-Vitamin D 600-400 MG-UNIT tablet Take 1 tablet by mouth 2 (two) times daily. 11/14/20   Alba Cory, MD  dapagliflozin propanediol (FARXIGA) 10 MG TABS tablet Take 1 tablet (10 mg total) by mouth daily before breakfast. Patient receives via [employer] Patient Assistance 10/10/22   Alba Cory, MD  escitalopram (LEXAPRO) 5 MG tablet TAKE 1 TABLET (5 MG TOTAL) BY MOUTH DAILY. 03/15/23   Alba Cory, MD  ferrous sulfate 325 (65 FE) MG EC tablet TAKE 1 TABLET BY MOUTH EVERY DAY 12/09/22   [name], MD  fluticasone [facility]) 50 MCG/ACT nasal spray SPRAY 2 SPRAYS INTO EACH NOSTRIL EVERY DAY 03/03/23   Alba Cory, MD  hydroquinone 4 % cream Apply topically at bedtime. 06/10/22   [provider]  latanoprost (XALATAN) 0.005 % ophthalmic solution SMARTSIG:In Eye(s) 07/12/20   [provider]  levothyroxine (SYNTHROID) 50 MCG tablet Take 1 tablet (50 mcg total) by mouth daily. But take two pills twice weekly 03/04/23   Alba Cory, MD  Magnesium Oxide 500 MG CAPS Take 500 mg by mouth daily.  11/10/07   [provider]  metoprolol succinate (TOPROL-XL) 50 MG 24 hr tablet Take 50 mg by mouth daily. 09/04/21   Callwood, Dwayne D, MD  oxybutynin (DITROPAN-XL) 10 MG 24 hr tablet Take 1 tablet (10 mg total) by mouth daily. 10/07/22   Michiel Cowboy A, PA-C  Oxycodone HCl 10 MG TABS Take 1 tablet by mouth 4 (four) times daily as needed.    Kennedy Bucker, MD  polyethylene glycol (MIRALAX / GLYCOLAX) 17 g packet Take 17 g by mouth daily. Patient taking differently: Take 17 g by mouth every other day. 09/15/20   Lanae Boast, MD  potassium chloride (KLOR-CON) 10 MEQ tablet Take 10 mEq by mouth  daily. 08/18/19   Callwood, Dwayne D, MD  sodium chloride (OCEAN) 0.65 % SOLN nasal spray Place 1 spray into both nostrils 3 (three) times daily. 07/20/21   Enedina Finner, MD  torsemide (DEMADEX) 20 MG tablet Take 1 tablet (20 mg total) by mouth daily. 12/01/22   Alba Cory, MD  traZODone (DESYREL) 50 MG tablet TAKE 0.5-1 TABLETS (25-50 MG TOTAL) BY MOUTH AT BEDTIME AS NEEDED. FOR SLEEP 02/02/23   Alba Cory, MD    Family History  Problem Relation Age of Onset   Diabetes Mother    Hyperlipidemia Mother    Hypertension Mother    Diabetes Father    Hyperlipidemia Father    Hypertension Father    Obesity Father    Hypertension Sister    Hyperlipidemia Sister    Hyperlipidemia Sister    Breast cancer  Neg Hx      Social History   Tobacco Use   Smoking status: Never   Smokeless tobacco: Never  Vaping Use   Vaping status: Never Used  Substance Use Topics   Alcohol use: No    Alcohol/week: 0.0 standard drinks of alcohol   Drug use: No    Allergies as of 03/30/2023 - Review Complete 03/30/2023  Allergen Reaction Noted   Lisinopril Swelling 04/10/2015    Review of Systems:    All systems reviewed and negative except where noted in HPI.   Physical Exam:  BP 123/62 (BP Location: Right Arm, Patient Position: Sitting, Cuff Size: Large)   Pulse (!) 53   Temp 97.7 F (36.5 C) (Oral)   Ht 5\' 11"  (1.803 m)   BMI 33.61 kg/m  No LMP recorded. Patient has had a hysterectomy. Psych:  Alert and cooperative. Normal mood and affect. General:   Alert,  Well-developed, well-nourished, pleasant and cooperative in NAD Head:  Normocephalic and atraumatic. Eyes:  Sclera clear, no icterus.   Conjunctiva pink. Ears:  Normal auditory acuity. Neck:  Supple; no masses or thyromegaly. Lungs:  Respirations even and unlabored.  Clear throughout to auscultation.   No wheezes, crackles, or rhonchi. No acute distress. Heart:  Regular rate and rhythm; no murmurs, clicks, rubs, or  gallops. Abdomen:  Normal bowel sounds.  No bruits.  Soft, non-tender and non-distended without masses, hepatosplenomegaly or hernias noted.  No guarding or rebound tenderness.    Neurologic:  Alert and oriented x3;  grossly normal neurologically. Psych:  Alert and cooperative. Normal mood and affect.  Imaging Studies: No results found.  Assessment and Plan:   Becky Trevino is a 77 y.o. y/o female has been referred for dysphagia. Barium swallow shows medium sized hiatal hernia, narrowing at GE junction , severe dysmotility.  She is on Eliquis for atrial fibrillation.   Plan   EGD+/- dilation and biopsies : blood thinner holding instructions Limit use of oxycodone Trial of PPI omeprazole 40 mg once a day Patient has been given information about acid reflux including timing of meals, using a wedge pillow, trial of PPI.  Very likely that she is having silent reflux and may not be able to go completely off PPI.  At next visit if doing well may consider decreasing the dose of PPI or changing over to Pepcid  I have discussed alternative options, risks & benefits,  which include, but are not limited to, bleeding, infection, perforation,respiratory complication & drug reaction.  The patient agrees with this plan & written consent will be obtained.     Follow up in 8 weeks with Celso Amy, PA,   Dr [name] MD,MRCP(U.K)

## 2023-03-30 NOTE — Telephone Encounter (Signed)
   [facility] Pre-operative Risk Assessment    Request for surgical clearance:  What type of surgery is being performed? EGD    When is this surgery scheduled? 05/13/2023   Are there any medications that need to be held prior to surgery and how long?Eliquis    Practice name and name of physician performing surgery? [facility] Dr. Tobi Bastos    What is your office phone and fax number? [phone]   Anesthesia type (None, local, MAC, general) ? General    Denman George 03/30/2023, 1:34 PM  _________________________________________________________________   (provider comments below)

## 2023-03-30 NOTE — Patient Instructions (Addendum)
Please get a Wedge Pillow you can get from [employer] or [address].   Gastroesophageal Reflux Disease, Adult  Gastroesophageal reflux (GER) happens when acid from the stomach flows up into the tube that connects the mouth and the stomach (esophagus). Normally, food travels down the esophagus and stays in the stomach to be digested. With GER, food and stomach acid sometimes move back up into the esophagus. You may have a disease called gastroesophageal reflux disease (GERD) if the reflux: Happens often. Causes frequent or very bad symptoms. Causes problems such as damage to the esophagus. When this happens, the esophagus becomes sore and swollen. Over time, GERD can make small holes (ulcers) in the lining of the esophagus. What are the causes? This condition is caused by a problem with the muscle between the esophagus and the stomach. When this muscle is weak or not normal, it does not close properly to keep food and acid from coming back up from the stomach. The muscle can be weak because of: Tobacco use. Pregnancy. Having a certain type of hernia (hiatal hernia). Alcohol use. Certain foods and drinks, such as coffee, chocolate, onions, and peppermint. What increases the risk? Being overweight. Having a disease that affects your connective tissue. Taking NSAIDs, such a ibuprofen. What are the signs or symptoms? Heartburn. Difficult or painful swallowing. The feeling of having a lump in the throat. A bitter taste in the mouth. Bad breath. Having a lot of saliva. Having an upset or bloated stomach. Burping. Chest pain. Different conditions can cause chest pain. Make sure you see your doctor if you have chest pain. Shortness of breath or wheezing. A long-term cough or a cough at night. Wearing away of the surface of teeth (tooth enamel). Weight loss. How is this treated? Making changes to your diet. Taking medicine. Having surgery. Treatment will depend on how bad your symptoms  are. Follow these instructions at home: Eating and drinking  Follow a diet as told by your doctor. You may need to avoid foods and drinks such as: Coffee and tea, with or without caffeine. Drinks that contain alcohol. Energy drinks and sports drinks. Bubbly (carbonated) drinks or sodas. Chocolate and cocoa. Peppermint and mint flavorings. Garlic and onions. Horseradish. Spicy and acidic foods. These include peppers, chili powder, curry powder, vinegar, hot sauces, and BBQ sauce. Citrus fruit juices and citrus fruits, such as oranges, lemons, and limes. Tomato-based foods. These include red sauce, chili, salsa, and pizza with red sauce. Fried and fatty foods. These include donuts, french fries, potato chips, and high-fat dressings. High-fat meats. These include hot dogs, rib eye steak, sausage, ham, and bacon. High-fat dairy items, such as whole milk, butter, and cream cheese. Eat small meals often. Avoid eating large meals. Avoid drinking large amounts of liquid with your meals. Avoid eating meals during the 2-3 hours before bedtime. Avoid lying down right after you eat. Do not exercise right after you eat. Lifestyle  Do not smoke or use any products that contain nicotine or tobacco. If you need help quitting, ask your doctor. Try to lower your stress. If you need help doing this, ask your doctor. If you are overweight, lose an amount of weight that is healthy for you. Ask your doctor about a safe weight loss goal. General instructions Pay attention to any changes in your symptoms. Take over-the-counter and prescription medicines only as told by your doctor. Do not take aspirin, ibuprofen, or other NSAIDs unless your doctor says it is okay. Wear loose clothes. Do not  wear anything tight around your waist. Raise (elevate) the head of your bed about 6 inches (15 cm). You may need to use a wedge to do this. Avoid bending over if this makes your symptoms worse. Keep all follow-up  visits. Contact a doctor if: You have new symptoms. You lose weight and you do not know why. You have trouble swallowing or it hurts to swallow. You have wheezing or a cough that keeps happening. You have a hoarse voice. Your symptoms do not get better with treatment. Get help right away if: You have sudden pain in your arms, neck, jaw, teeth, or back. You suddenly feel sweaty, dizzy, or light-headed. You have chest pain or shortness of breath. You vomit and the vomit is green, yellow, or black, or it looks like blood or coffee grounds. You faint. Your poop (stool) is red, bloody, or black. You cannot swallow, drink, or eat. These symptoms may represent a serious problem that is an emergency. Do not wait to see if the symptoms will go away. Get medical help right away. Call your local emergency services (911 in the [place] Do not drive yourself to the hospital. Summary If a person has gastroesophageal reflux disease (GERD), food and stomach acid move back up into the esophagus and cause symptoms or problems such as damage to the esophagus. Treatment will depend on how bad your symptoms are. Follow a diet as told by your doctor. Take all medicines only as told by your doctor. This information is not intended to replace advice given to you by your health care provider. Make sure you discuss any questions you have with your health care provider. Document Revised: 03/12/2020 Document Reviewed: 03/12/2020 Elsevier Patient Education  2024 [employer].

## 2023-03-31 NOTE — Telephone Encounter (Signed)
Patient follows with [facility] cardiology.  Contact patient and notify them that their request should be directed to the [place] [facility].  Thank you for your help.  Thomasene Ripple. Albertha Beattie NP-C     03/31/2023, 11:42 AM [facility] [address] Office [phone] Fax [phone]

## 2023-03-31 NOTE — Telephone Encounter (Signed)
Patient has never seen [facility] and doesn't follow with [place]. Clearance should come from managing provider - [facility] cardiology.

## 2023-03-31 NOTE — Telephone Encounter (Signed)
Routed back to Radene Knee, CMA, at requesting surgeon's office to make her aware clearance should come from [facility].

## 2023-04-07 NOTE — Telephone Encounter (Signed)
Called patient but spoke to spouse- Becky Trevino and he stated that she was out of the house and he stated that he would let her know that I called so to tell him what I needed to tell her. So, I told her that she was to hold her Eliquis 3 days before and restart it 2 days after. He stated that he had written it down and give her the message when she came back home.

## 2023-04-09 ENCOUNTER — Other Ambulatory Visit: Payer: Self-pay | Admitting: Urology

## 2023-04-14 DIAGNOSIS — I272 Pulmonary hypertension, unspecified: Secondary | ICD-10-CM | POA: Diagnosis not present

## 2023-04-14 DIAGNOSIS — I872 Venous insufficiency (chronic) (peripheral): Secondary | ICD-10-CM | POA: Diagnosis not present

## 2023-04-14 DIAGNOSIS — I480 Paroxysmal atrial fibrillation: Secondary | ICD-10-CM | POA: Diagnosis not present

## 2023-04-14 NOTE — Progress Notes (Unsigned)
Name: Becky Trevino   MRN: [id number]    DOB: 01/24/1946   Date:04/15/2023       Progress Note  Subjective  Chief Complaint  Follow Up  HPI  Hypothyroidism: on levothyroxine 50 mcg daily , last TSH was high, we advised her to take one daily and two twice weekly but she is not sure if she is taking two tablets twice a week as recommended. She states her husband fills her pill box. She will call back once she gets home. Denies change in bowel movement, dysphagia.  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) 12/01/2022   Interstitial lung disease (HCC) 12/01/2022   Atherosclerosis of native arteries of extremity with intermittent claudication (HCC) 10/16/2022   Anemia due to chronic kidney disease 08/26/2022   Hypothyroidism (acquired) 01/27/2022   PAD (peripheral artery disease) (HCC) 09/27/2021   AF (paroxysmal atrial fibrillation) (HCC) 09/15/2021   Primary osteoarthritis of right hip 07/18/2021   Atherosclerosis of aorta (HCC) 06/07/2021   Mild protein-calorie malnutrition (HCC)    Hyperbilirubinemia    Injury of left femoral nerve 08/28/2020   Lymphedema 06/25/2020   History of pelvic hematoma 02/10/2020   Chronic venous hypertension due to deep vein thrombosis (DVT) 10/30/2019   Normocytic anemia 08/07/2019   Chronic infection of right knee (HCC) 11/25/2018   Infection and inflammatory reaction due to internal joint prosthesis, subsequent encounter 01/25/2018   Infection of total right knee replacement (HCC) 11/19/2017   Type 2 diabetes mellitus with stage 3b chronic kidney disease, without long-term current use of insulin (HCC) 11/06/2016   Primary osteoarthritis of knee 03/11/2016   Primary osteoarthritis of left hip 12/11/2015   Essential hypertension 04/21/2015   Chronic kidney disease (CKD), stage III (moderate) (HCC)  04/21/2015   Chronic venous insufficiency 04/21/2015   DDD (degenerative disc disease), cervical 04/21/2015   Diabetes mellitus with renal manifestation (HCC) 04/21/2015   Hyperlipidemia 04/21/2015   Bladder cystocele 04/21/2015   Urinary incontinence 04/21/2015   Leg varices 04/21/2015   Insomnia 04/21/2015   Eczema intertrigo 04/21/2015   Obesity, Class III, BMI 40-49.9 (morbid obesity) (HCC) 04/21/2015   OSA (obstructive sleep apnea) 04/21/2015   Osteoarthritis of multiple joints 04/21/2015   H/O Spinal surgery 11/14/2013   Detrusor muscle hypertonia 11/07/2013    Past Surgical History:  Procedure Laterality Date   ABDOMINAL HYSTERECTOMY  1993   Total   BUNIONECTOMY Bilateral 1993   I & D KNEE WITH POLY EXCHANGE Right 11/19/2017   Procedure: RIGHT KNEE POLY EXCHANGE WITH IRRIGATION AND DEBRIDEMENT;  Surgeon: Kennedy Bucker, MD;  Location: [facility];  Service: Orthopedics;  Laterality: Right;   IR FLUORO GUIDED NEEDLE PLC ASPIRATION/INJECTION LOC  09/19/2021   JOINT REPLACEMENT     bilateral knee   LAMINECTOMY  11/14/2013    Cervical Fusion , [facility], Dr. Timoteo Ace   LASER ABLATION Bilateral 07/29/2012   Dr. Wyn Quaker   LOWER EXTREMITY ANGIOGRAPHY Right 11/02/2019   Procedure: Lower Extremity Angiography;  Surgeon: Renford Dills, MD;  Location: [facility];  Service: Cardiovascular;  Laterality: Right;   ORIF ANKLE FRACTURE Right 09/02/2019   Procedure: OPEN REDUCTION INTERNAL FIXATION (ORIF) ANKLE FRACTURE BIMALLEOLAR;  Surgeon: Rosetta Posner, DPM;  Location: [facility];  Service: Podiatry;  Laterality: Right;   PERIPHERAL VASCULAR THROMBECTOMY Right 03/02/2020   Procedure: PERIPHERAL  VASCULAR THROMBECTOMY;  Surgeon: Annice Needy, MD;  Location: [facility];  Service: Cardiovascular;  Laterality: Right;   REPLACEMENT TOTAL KNEE Left 03/2010   Dr. Rosita Kea   TEE WITHOUT CARDIOVERSION N/A 09/19/2021   Procedure: TRANSESOPHAGEAL ECHOCARDIOGRAM (TEE);  Surgeon: Lamar Blinks, MD;   Location: [facility];  Service: Cardiovascular;  Laterality: N/A;   TOTAL HIP ARTHROPLASTY Left 12/11/2015   Procedure: TOTAL HIP ARTHROPLASTY ANTERIOR APPROACH;  Surgeon: Kennedy Bucker, MD;  Location: [facility];  Service: Orthopedics;  Laterality: Left;   TOTAL KNEE ARTHROPLASTY Right 03/11/2016   Procedure: TOTAL KNEE ARTHROPLASTY;  Surgeon: Kennedy Bucker, MD;  Location: [facility];  Service: Orthopedics;  Laterality: Right;    Family History  Problem Relation Age of Onset   Diabetes Mother    Hyperlipidemia Mother    Hypertension Mother    Diabetes Father    Hyperlipidemia Father    Hypertension Father    Obesity Father    Hypertension Sister    Hyperlipidemia Sister    Hyperlipidemia Sister    Breast cancer Neg Hx     Social History   Tobacco Use   Smoking status: Never   Smokeless tobacco: Never  Substance Use Topics   Alcohol use: No    Alcohol/week: 0.0 standard drinks of alcohol     Current Outpatient Medications:    ACCU-CHEK GUIDE test strip, TEST ONCE A DAY AS DIRECTED, Disp: 50 strip, Rfl: 1   acetaminophen (TYLENOL) 650 MG CR tablet, Take 650-1,300 mg by mouth 2 (two) times daily as needed for pain., Disp: , Rfl:    amiodarone (PACERONE) 200 MG tablet, Take 200 mg by mouth daily., Disp: , Rfl:    amoxicillin (AMOXIL) 500 MG capsule, Take 500 mg by mouth 2 (two) times daily., Disp: , Rfl:    apixaban (ELIQUIS) 5 MG TABS tablet, Take 1 tablet by mouth 2 (two) times daily., Disp: , Rfl:    atorvastatin (LIPITOR) 40 MG tablet, Take 1 tablet (40 mg total) by mouth every evening., Disp: 90 tablet, Rfl: 1   blood glucose meter kit and supplies, 1 each by Other route daily at 12 noon. Dispense based on patient and insurance preference. Check fsbs once a day, Disp: 1 each, Rfl: 0   Calcium Carbonate-Vitamin D 600-400 MG-UNIT tablet, Take 1 tablet by mouth 2 (two) times daily., Disp: 60 tablet, Rfl: 0   dapagliflozin propanediol (FARXIGA) 10 MG TABS tablet, Take 1 tablet (10 mg  total) by mouth daily before breakfast. Patient receives via [employer] Patient Assistance, Disp: 90 tablet, Rfl: 3   escitalopram (LEXAPRO) 5 MG tablet, TAKE 1 TABLET (5 MG TOTAL) BY MOUTH DAILY., Disp: 90 tablet, Rfl: 1   ferrous sulfate 325 (65 FE) MG EC tablet, TAKE 1 TABLET BY MOUTH EVERY DAY, Disp: 90 tablet, Rfl: 3   fluticasone (FLONASE) 50 MCG/ACT nasal spray, SPRAY 2 SPRAYS INTO EACH NOSTRIL EVERY DAY, Disp: 48 mL, Rfl: 0   hydroquinone 4 % cream, Apply topically at bedtime., Disp: , Rfl:    latanoprost (XALATAN) 0.005 % ophthalmic solution, SMARTSIG:In Eye(s), Disp: , Rfl:    levothyroxine (SYNTHROID) 50 MCG tablet, Take 1 tablet (50 mcg total) by mouth daily. But take two pills twice weekly, Disp: 102 tablet, Rfl: 0   Magnesium Oxide 500 MG CAPS, Take 500 mg by mouth daily. , Disp: , Rfl:    metoprolol succinate (TOPROL-XL) 50 MG 24 hr tablet, Take 50 mg by mouth daily., Disp: , Rfl:  omeprazole (PRILOSEC) 40 MG capsule, Take 1 capsule (40 mg total) by mouth daily., Disp: 90 capsule, Rfl: 0   oxybutynin (DITROPAN-XL) 10 MG 24 hr tablet, TAKE 1 TABLET BY MOUTH EVERY DAY, Disp: 90 tablet, Rfl: 2   Oxycodone HCl 10 MG TABS, Take 1 tablet by mouth 4 (four) times daily as needed., Disp: , Rfl:    polyethylene glycol (MIRALAX / GLYCOLAX) 17 g packet, Take 17 g by mouth daily. (Patient taking differently: Take 17 g by mouth every other day.), Disp: 14 each, Rfl: 0   potassium chloride (KLOR-CON) 10 MEQ tablet, Take 10 mEq by mouth daily., Disp: , Rfl:    SIMBRINZA 1-0.2 % SUSP, Apply to eye., Disp: , Rfl:    sodium chloride (OCEAN) 0.65 % SOLN nasal spray, Place 1 spray into both nostrils 3 (three) times daily., Disp: 30 mL, Rfl: 0   torsemide (DEMADEX) 20 MG tablet, Take 1 tablet (20 mg total) by mouth daily., Disp: 90 tablet, Rfl: 1   traZODone (DESYREL) 50 MG tablet, TAKE 0.5-1 TABLETS (25-50 MG TOTAL) BY MOUTH AT BEDTIME AS NEEDED. FOR SLEEP, Disp: 90 tablet, Rfl: 0  Allergies  Allergen  Reactions   Lisinopril Swelling    I personally reviewed active problem list, medication list, allergies, family history, social history, health maintenance with the patient/caregiver today.   ROS  Ten systems reviewed and is negative except as mentioned in HPI    Objective  Vitals:   04/15/23 1054  BP: 116/60  Pulse: 61  Resp: 14  Temp: 97.7 F (36.5 C)  TempSrc: Oral  SpO2: 97%  Weight: 241 lb (109.3 kg)  Height: 5\' 11"  (1.803 m)    Body mass index is 33.61 kg/m.  Physical Exam  Constitutional: Patient appears well-developed and well-nourished. Obese  No distress.  HEENT: head atraumatic, normocephalic, neck supple Cardiovascular: Normal rate, regular rhythm and normal heart sounds.  No murmur heard. Pulmonary/Chest: Effort normal and breath sounds normal. No respiratory distress. Abdominal: Soft.  There is no tenderness. Psychiatric: Patient has a normal mood and affect. behavior is normal. Judgment and thought content normal.    PHQ2/9:    04/15/2023   10:57 AM 03/02/2023    9:48 AM 12/01/2022   11:42 AM 08/29/2022    2:54 PM 08/05/2022   10:25 AM  Depression screen PHQ 2/9  Decreased Interest 0 0 0 0 0  Down, Depressed, Hopeless 0 0 0 0 0  PHQ - 2 Score 0 0 0 0 0  Altered sleeping 0 0 0 0   Tired, decreased energy 0 0 0 0   Change in appetite 0 0 0 0   Feeling bad or failure about yourself  0 0 0 0   Trouble concentrating 0 0 0 0   Moving slowly or fidgety/restless 0 0 0 0   Suicidal thoughts 0 0 0 0   PHQ-9 Score 0 0 0 0     phq 9 is negative   Fall Risk:    04/15/2023   10:57 AM 03/02/2023    9:48 AM 12/01/2022   11:42 AM 08/29/2022    2:54 PM 08/05/2022   10:26 AM  Fall Risk   Falls in the past year? 0 0 0 0 0  Number falls in past yr:  0 0  0  Injury with Fall?  0 0  0  Risk for fall due to : Impaired mobility Impaired mobility;Impaired balance/gait Impaired balance/gait;Impaired mobility No Fall Risks No Fall Risks  Follow up Falls  prevention discussed;Education provided;Falls evaluation completed Falls prevention discussed Falls prevention discussed Falls prevention discussed;Education provided;Falls evaluation completed Falls evaluation completed      Functional Status Survey: Is the patient deaf or have difficulty hearing?: No Does the patient have difficulty seeing, even when wearing glasses/contacts?: No Does the patient have difficulty concentrating, remembering, or making decisions?: No Does the patient have difficulty walking or climbing stairs?: Yes Does the patient have difficulty dressing or bathing?: Yes Does the patient have difficulty doing errands alone such as visiting a doctor's office or shopping?: Yes    Assessment & Plan  1. Hypothyroidism (acquired)  - TSH  2. Hypotension due to drugs  She is on Farxiga, pacerone and demadex that can be causing bp to drop

## 2023-04-15 ENCOUNTER — Telehealth: Payer: Self-pay | Admitting: Family Medicine

## 2023-04-15 ENCOUNTER — Encounter: Payer: Self-pay | Admitting: Family Medicine

## 2023-04-15 ENCOUNTER — Ambulatory Visit (INDEPENDENT_AMBULATORY_CARE_PROVIDER_SITE_OTHER): Payer: Medicare Other | Admitting: Family Medicine

## 2023-04-15 VITALS — BP 116/60 | HR 61 | Temp 97.7°F | Resp 14 | Ht 69.0 in | Wt 253.4 lb

## 2023-04-15 DIAGNOSIS — I952 Hypotension due to drugs: Secondary | ICD-10-CM

## 2023-04-15 DIAGNOSIS — E039 Hypothyroidism, unspecified: Secondary | ICD-10-CM | POA: Diagnosis not present

## 2023-04-15 NOTE — Telephone Encounter (Signed)
Pt returned call and stated her bottle for levothyroxin says for her to take 1 pill by mouth daily. Please advise

## 2023-04-17 ENCOUNTER — Other Ambulatory Visit: Payer: Self-pay | Admitting: Family Medicine

## 2023-04-17 DIAGNOSIS — E039 Hypothyroidism, unspecified: Secondary | ICD-10-CM

## 2023-04-17 MED ORDER — LEVOTHYROXINE SODIUM 50 MCG PO TABS
50.0000 ug | ORAL_TABLET | Freq: Every day | ORAL | 0 refills | Status: DC
Start: 2023-04-17 — End: 2023-08-31

## 2023-04-17 NOTE — Telephone Encounter (Signed)
Spoke with patient and clarified instructions per Dr. Carlynn Purl.

## 2023-04-17 NOTE — Telephone Encounter (Signed)
Pt calling back again this morning.  Her bottle of levothyroxine states take one pill daily, but 2 x a week take 2 pills. Pt needs to know what to do and if to take 2 pills, which days? Please advise on what to do.

## 2023-04-21 ENCOUNTER — Other Ambulatory Visit: Payer: Self-pay | Admitting: Family Medicine

## 2023-04-21 DIAGNOSIS — R600 Localized edema: Secondary | ICD-10-CM

## 2023-04-22 ENCOUNTER — Other Ambulatory Visit: Payer: Self-pay

## 2023-04-22 DIAGNOSIS — I890 Lymphedema, not elsewhere classified: Secondary | ICD-10-CM | POA: Diagnosis not present

## 2023-04-22 DIAGNOSIS — R600 Localized edema: Secondary | ICD-10-CM

## 2023-04-24 DIAGNOSIS — B351 Tinea unguium: Secondary | ICD-10-CM | POA: Diagnosis not present

## 2023-04-24 DIAGNOSIS — E1142 Type 2 diabetes mellitus with diabetic polyneuropathy: Secondary | ICD-10-CM | POA: Diagnosis not present

## 2023-04-24 DIAGNOSIS — L851 Acquired keratosis [keratoderma] palmaris et plantaris: Secondary | ICD-10-CM | POA: Diagnosis not present

## 2023-05-06 ENCOUNTER — Encounter: Payer: Self-pay | Admitting: Gastroenterology

## 2023-05-07 ENCOUNTER — Telehealth: Payer: Self-pay | Admitting: Family Medicine

## 2023-05-07 NOTE — Telephone Encounter (Signed)
[place] calling from [employer] is calling for Pressure orders and sleep study signed by provider and script for CPAP with pressure settings dated within 3 months. CB- [phone]-fax [phone]-Phone

## 2023-05-08 NOTE — Telephone Encounter (Signed)
In process 

## 2023-05-13 ENCOUNTER — Ambulatory Visit
Admission: RE | Admit: 2023-05-13 | Discharge: 2023-05-13 | Disposition: A | Discharge status: Home / Self Care | Payer: Medicare Other | Attending: Gastroenterology | Admitting: Gastroenterology

## 2023-05-13 ENCOUNTER — Ambulatory Visit: Payer: Medicare Other | Admitting: Anesthesiology

## 2023-05-13 ENCOUNTER — Encounter
Admission: RE | Disposition: A | Discharge status: Home / Self Care | Payer: Self-pay | Source: Home / Self Care | Attending: Gastroenterology

## 2023-05-13 ENCOUNTER — Encounter: Payer: Self-pay | Admitting: Gastroenterology

## 2023-05-13 ENCOUNTER — Other Ambulatory Visit: Payer: Self-pay

## 2023-05-13 DIAGNOSIS — Z6834 Body mass index (BMI) 34.0-34.9, adult: Secondary | ICD-10-CM | POA: Diagnosis not present

## 2023-05-13 DIAGNOSIS — G4733 Obstructive sleep apnea (adult) (pediatric): Secondary | ICD-10-CM | POA: Insufficient documentation

## 2023-05-13 DIAGNOSIS — Z833 Family history of diabetes mellitus: Secondary | ICD-10-CM | POA: Diagnosis not present

## 2023-05-13 DIAGNOSIS — I739 Peripheral vascular disease, unspecified: Secondary | ICD-10-CM | POA: Diagnosis not present

## 2023-05-13 DIAGNOSIS — I272 Pulmonary hypertension, unspecified: Secondary | ICD-10-CM | POA: Insufficient documentation

## 2023-05-13 DIAGNOSIS — K449 Diaphragmatic hernia without obstruction or gangrene: Secondary | ICD-10-CM

## 2023-05-13 DIAGNOSIS — I4510 Unspecified right bundle-branch block: Secondary | ICD-10-CM | POA: Insufficient documentation

## 2023-05-13 DIAGNOSIS — N183 Chronic kidney disease, stage 3 unspecified: Secondary | ICD-10-CM | POA: Diagnosis not present

## 2023-05-13 DIAGNOSIS — R1319 Other dysphagia: Secondary | ICD-10-CM

## 2023-05-13 DIAGNOSIS — Z7984 Long term (current) use of oral hypoglycemic drugs: Secondary | ICD-10-CM | POA: Diagnosis not present

## 2023-05-13 DIAGNOSIS — R1310 Dysphagia, unspecified: Secondary | ICD-10-CM | POA: Diagnosis not present

## 2023-05-13 DIAGNOSIS — I129 Hypertensive chronic kidney disease with stage 1 through stage 4 chronic kidney disease, or unspecified chronic kidney disease: Secondary | ICD-10-CM | POA: Insufficient documentation

## 2023-05-13 DIAGNOSIS — K219 Gastro-esophageal reflux disease without esophagitis: Secondary | ICD-10-CM

## 2023-05-13 DIAGNOSIS — Z7901 Long term (current) use of anticoagulants: Secondary | ICD-10-CM | POA: Insufficient documentation

## 2023-05-13 DIAGNOSIS — K224 Dyskinesia of esophagus: Secondary | ICD-10-CM | POA: Diagnosis not present

## 2023-05-13 DIAGNOSIS — E1122 Type 2 diabetes mellitus with diabetic chronic kidney disease: Secondary | ICD-10-CM | POA: Diagnosis not present

## 2023-05-13 DIAGNOSIS — E785 Hyperlipidemia, unspecified: Secondary | ICD-10-CM | POA: Insufficient documentation

## 2023-05-13 DIAGNOSIS — I480 Paroxysmal atrial fibrillation: Secondary | ICD-10-CM | POA: Diagnosis not present

## 2023-05-13 DIAGNOSIS — I872 Venous insufficiency (chronic) (peripheral): Secondary | ICD-10-CM | POA: Diagnosis not present

## 2023-05-13 HISTORY — PX: ESOPHAGOGASTRODUODENOSCOPY (EGD) WITH PROPOFOL: SHX5813

## 2023-05-13 LAB — GLUCOSE, CAPILLARY: Glucose-Capillary: 78 mg/dL (ref 70–99)

## 2023-05-13 SURGERY — ESOPHAGOGASTRODUODENOSCOPY (EGD) WITH PROPOFOL
Anesthesia: General

## 2023-05-13 MED ORDER — PROPOFOL 10 MG/ML IV BOLUS
INTRAVENOUS | Status: DC | PRN
Start: 2023-05-13 — End: 2023-05-13
  Administered 2023-05-13: 50 mg via INTRAVENOUS
  Administered 2023-05-13 (×2): 20 mg via INTRAVENOUS

## 2023-05-13 MED ORDER — SODIUM CHLORIDE 0.9 % IV SOLN
INTRAVENOUS | Status: DC
  Administered 2023-05-13: 20 mL/h via INTRAVENOUS

## 2023-05-13 MED ORDER — LIDOCAINE HCL (PF) 2 % IJ SOLN
INTRAMUSCULAR | Status: DC | PRN
  Administered 2023-05-13: 40 mg via INTRADERMAL

## 2023-05-13 NOTE — Transfer of Care (Signed)
Immediate Anesthesia Transfer of Care Note  Patient: Becky Trevino  Procedure(s) Performed: ESOPHAGOGASTRODUODENOSCOPY (EGD) WITH PROPOFOL  Patient Location: endoscopy unit  Anesthesia Type: MAC  Level of Consciousness: drowsy  Airway & Oxygen Therapy: Patient Spontanous Breathing and Patient connected to nasal cannula oxygen  Post-op Assessment: Report given to RN and Post -op Vital signs reviewed and stable  Post vital signs: Reviewed and stable  Last Vitals:  Vitals Value Taken Time  BP 107/62 05/13/23 1115  Temp 36.4 C 05/13/23 1115  Pulse 60 05/13/23 1117  Resp 15 05/13/23 1117  SpO2 100 % 05/13/23 1117  Vitals shown include unfiled device data.  Last Pain:  Vitals:   05/13/23 1115  TempSrc: Temporal  PainSc: Asleep         Complications: No notable events documented.

## 2023-05-13 NOTE — H&P (Signed)
[name], MD [address], [address], [place], [place], [address] [address], [address], [place], [place], [address] Phone: [phone]  Fax: [phone]  Primary Care Physician:  Alba Cory, MD   Pre-Procedure History & Physical: HPI:  Becky Trevino is a 77 y.o. female is here for an endoscopy    Past Medical History:  Diagnosis Date   Cervical spondylosis    Chronic kidney disease    Stage 3   DDD (degenerative disc disease), cervical    [facility]   Diabetes mellitus without complication (HCC)    Diverticulosis    Hyperlipidemia    Hypertension    Intertrigo    Leg cramps    Leg varices    Obesity    OSA (obstructive sleep apnea)    Symptomatic menopausal or female climacteric states    Syncope     Past Surgical History:  Procedure Laterality Date   ABDOMINAL HYSTERECTOMY  1993   Total   BUNIONECTOMY Bilateral 1993   I & D KNEE WITH POLY EXCHANGE Right 11/19/2017   Procedure: RIGHT KNEE POLY EXCHANGE WITH IRRIGATION AND DEBRIDEMENT;  Surgeon: Kennedy Bucker, MD;  Location: [facility];  Service: Orthopedics;  Laterality: Right;   IR FLUORO GUIDED NEEDLE PLC ASPIRATION/INJECTION LOC  09/19/2021   JOINT REPLACEMENT     bilateral knee   LAMINECTOMY  11/14/2013    Cervical Fusion , [facility], Dr. Timoteo Ace   LASER ABLATION Bilateral 07/29/2012   Dr. Wyn Quaker   LOWER EXTREMITY ANGIOGRAPHY Right 11/02/2019   Procedure: Lower Extremity Angiography;  Surgeon: Renford Dills, MD;  Location: [facility];  Service: Cardiovascular;  Laterality: Right;   ORIF ANKLE FRACTURE Right 09/02/2019   Procedure: OPEN REDUCTION INTERNAL FIXATION (ORIF) ANKLE FRACTURE BIMALLEOLAR;  Surgeon: Rosetta Posner, DPM;  Location: [facility];  Service: Podiatry;  Laterality: Right;   PERIPHERAL VASCULAR THROMBECTOMY Right 03/02/2020   Procedure: PERIPHERAL VASCULAR THROMBECTOMY;  Surgeon: Annice Needy, MD;  Location: [facility];  Service: Cardiovascular;  Laterality: Right;    REPLACEMENT TOTAL KNEE Left 03/2010   Dr. Rosita Kea   TEE WITHOUT CARDIOVERSION N/A 09/19/2021   Procedure: TRANSESOPHAGEAL ECHOCARDIOGRAM (TEE);  Surgeon: Lamar Blinks, MD;  Location: [facility];  Service: Cardiovascular;  Laterality: N/A;   TOTAL HIP ARTHROPLASTY Left 12/11/2015   Procedure: TOTAL HIP ARTHROPLASTY ANTERIOR APPROACH;  Surgeon: Kennedy Bucker, MD;  Location: [facility];  Service: Orthopedics;  Laterality: Left;   TOTAL KNEE ARTHROPLASTY Right 03/11/2016   Procedure: TOTAL KNEE ARTHROPLASTY;  Surgeon: Kennedy Bucker, MD;  Location: [facility];  Service: Orthopedics;  Laterality: Right;    Prior to Admission medications   Medication Sig Start Date End Date Taking? Authorizing Provider  ACCU-CHEK GUIDE test strip TEST ONCE A DAY AS DIRECTED 07/28/22   Alba Cory, MD  acetaminophen (TYLENOL) 650 MG CR tablet Take 650-1,300 mg by mouth 2 (two) times daily as needed for pain.    [provider]  amiodarone (PACERONE) 200 MG tablet Take 200 mg by mouth daily. 07/21/21   Callwood, Dwayne D, MD  amoxicillin (AMOXIL) 500 MG capsule Take 500 mg by mouth 2 (two) times daily. 04/13/23   [provider]  apixaban (ELIQUIS) 5 MG TABS tablet Take 1 tablet by mouth 2 (two) times daily. 07/31/21   Callwood, Dwayne D, MD  atorvastatin (LIPITOR) 40 MG tablet Take 1 tablet (40 mg total) by mouth every evening. 12/01/22   Alba Cory, MD  blood glucose  meter kit and supplies 1 each by Other route daily at 12 noon. Dispense based on patient and insurance preference. Check fsbs once a day 10/07/21   Alba Cory, MD  Calcium Carbonate-Vitamin D 600-400 MG-UNIT tablet Take 1 tablet by mouth 2 (two) times daily. 11/14/20   Alba Cory, MD  dapagliflozin propanediol (FARXIGA) 10 MG TABS tablet Take 1 tablet (10 mg total) by mouth daily before breakfast. Patient receives via [employer] Patient Assistance 10/10/22   Alba Cory, MD  escitalopram (LEXAPRO) 5 MG tablet TAKE 1 TABLET (5 MG TOTAL)  BY MOUTH DAILY. 03/15/23   Alba Cory, MD  ferrous sulfate 325 (65 FE) MG EC tablet TAKE 1 TABLET BY MOUTH EVERY DAY 12/09/22   [name], MD  fluticasone [facility]) 50 MCG/ACT nasal spray SPRAY 2 SPRAYS INTO EACH NOSTRIL EVERY DAY 03/03/23   Alba Cory, MD  hydroquinone 4 % cream Apply topically at bedtime. 06/10/22   [provider]  latanoprost (XALATAN) 0.005 % ophthalmic solution SMARTSIG:In Eye(s) 07/12/20   [provider]  levothyroxine (SYNTHROID) 50 MCG tablet Take 1 tablet (50 mcg total) by mouth daily. But take two tablets once a week 04/17/23   Alba Cory, MD  Magnesium Oxide 500 MG CAPS Take 500 mg by mouth daily.  11/10/07   [provider]  metoprolol succinate (TOPROL-XL) 50 MG 24 hr tablet Take 50 mg by mouth daily. 09/04/21   Callwood, Dwayne D, MD  omeprazole (PRILOSEC) 40 MG capsule Take 1 capsule (40 mg total) by mouth daily. 03/30/23   [name], MD  oxybutynin (DITROPAN-XL) 10 MG 24 hr tablet TAKE 1 TABLET BY MOUTH EVERY DAY 04/09/23   Michiel Cowboy A, PA-C  Oxycodone HCl 10 MG TABS Take 1 tablet by mouth 4 (four) times daily as needed.    Kennedy Bucker, MD  polyethylene glycol (MIRALAX / GLYCOLAX) 17 g packet Take 17 g by mouth daily. Patient taking differently: Take 17 g by mouth every other day. 09/15/20   Lanae Boast, MD  potassium chloride (KLOR-CON) 10 MEQ tablet Take 10 mEq by mouth daily. 08/18/19   Callwood, Gerda Diss D, MD  SIMBRINZA 1-0.2 % SUSP Apply to eye. 03/13/23   [provider]  sodium chloride (OCEAN) 0.65 % SOLN nasal spray Place 1 spray into both nostrils 3 (three) times daily. 07/20/21   Enedina Finner, MD  torsemide (DEMADEX) 20 MG tablet TAKE 1 TABLET BY MOUTH EVERY DAY 04/22/23   Alba Cory, MD  traZODone (DESYREL) 50 MG tablet TAKE 0.5-1 TABLETS (25-50 MG TOTAL) BY MOUTH AT BEDTIME AS NEEDED. FOR SLEEP 02/02/23   Alba Cory, MD    Allergies as of 03/30/2023 - Review Complete 03/30/2023  Allergen Reaction  Noted   Lisinopril Swelling 04/10/2015    Family History  Problem Relation Age of Onset   Diabetes Mother    Hyperlipidemia Mother    Hypertension Mother    Diabetes Father    Hyperlipidemia Father    Hypertension Father    Obesity Father    Hypertension Sister    Hyperlipidemia Sister    Hyperlipidemia Sister    Breast cancer Neg Hx     Social History   Socioeconomic History   Marital status: Married    Spouse name: Bethan Lucke   Number of children: 1   Years of education: Not on file   Highest education level: Not on file  Occupational History   Occupation: Retired  Tobacco Use   Smoking status: Never   Smokeless  tobacco: Never  Vaping Use   Vaping status: Never Used  Substance and Sexual Activity   Alcohol use: No    Alcohol/week: 0.0 standard drinks of alcohol   Drug use: No   Sexual activity: Yes    Partners: Male  Other Topics Concern   Not on file  Social History Narrative   Married, one adopted grown child    Social Determinants of Health   Financial Resource Strain: High Risk (08/05/2022)   Overall Financial Resource Strain (CARDIA)    Difficulty of Paying Living Expenses: Very hard  Food Insecurity: No Food Insecurity (09/02/2022)   Hunger Vital Sign    Worried About Running Out of Food in the Last Year: Never true    Ran Out of Food in the Last Year: Never true  Transportation Needs: No Transportation Needs (09/02/2022)   PRAPARE - Administrator, Civil Service (Medical): No    Lack of Transportation (Non-Medical): No  Physical Activity: Insufficiently Active (08/05/2022)   Exercise Vital Sign    Days of Exercise per Week: 5 days    Minutes of Exercise per Session: 20 min  Stress: No Stress Concern Present (08/05/2022)   [employer] of Occupational Health - Occupational Stress Questionnaire    Feeling of Stress : Not at all  Social Connections: Socially Integrated (08/05/2022)   Social Connection and Isolation Panel  [NHANES]    Frequency of Communication with Friends and Family: Twice a week    Frequency of Social Gatherings with Friends and Family: Once a week    Attends Religious Services: More than 4 times per year    Active Member of [employer] or Organizations: Yes    Attends Banker Meetings: 1 to 4 times per year    Marital Status: Married  Catering manager Violence: Not At Risk (08/05/2022)   Humiliation, Afraid, Rape, and Kick questionnaire    Fear of Current or Ex-Partner: No    Emotionally Abused: No    Physically Abused: No    Sexually Abused: No    Review of Systems: See HPI, otherwise negative ROS  Physical Exam: There were no vitals taken for this visit. General:   Alert,  pleasant and cooperative in NAD Head:  Normocephalic and atraumatic. Neck:  Supple; no masses or thyromegaly. Lungs:  Clear throughout to auscultation, normal respiratory effort.    Heart:  +S1, +S2, Regular rate and rhythm, No edema. Abdomen:  Soft, nontender and nondistended. Normal bowel sounds, without guarding, and without rebound.   Neurologic:  Alert and  oriented x4;  grossly normal neurologically.  Impression/Plan: Becky Trevino is here for an endoscopy  to be performed for  evaluation of dysphagia    Risks, benefits, limitations, and alternatives regarding endoscopy have been reviewed with the patient.  Questions have been answered.  All parties agreeable.   [name], MD  05/13/2023, 9:56 AM

## 2023-05-13 NOTE — Anesthesia Preprocedure Evaluation (Addendum)
Anesthesia Evaluation  Patient identified by MRN, date of birth, ID band Patient awake    Reviewed: Allergy & Precautions, NPO status , Patient's Chart, lab work & pertinent test results  History of Anesthesia Complications Negative for: history of anesthetic complications  Airway Mallampati: IV   Neck ROM: Full    Dental  (+) Upper Dentures, Lower Dentures   Pulmonary sleep apnea    Pulmonary exam normal breath sounds clear to auscultation       Cardiovascular hypertension, + Peripheral Vascular Disease  Normal cardiovascular exam+ dysrhythmias (a fib on Eliquis)  Rhythm:Regular Rate:Normal  ECG 11/07/22:  Sinus rhythm Prolonged PR interval Consider right atrial enlargement Incomplete right bundle branch block Baseline wander in lead(s) V3   Neuro/Psych negative neurological ROS     GI/Hepatic negative GI ROS,,,  Endo/Other  diabetes, Type 2Hypothyroidism  Class 3 obesity  Renal/GU Renal disease (stage III CKD)     Musculoskeletal   Abdominal   Peds  Hematology   Anesthesia Other Findings Cardiology note 04/14/23:  77 y.o. female with  1. Lymphedema  2. Paroxysmal atrial fibrillation (CMS/HHS-HCC)  3. Chronic venous insufficiency  4. Sleep apnea, unspecified type  5. Morbid obesity with BMI of 40.0-44.9, adult (CMS/HHS-HCC)  6. Stage 3 chronic kidney disease, unspecified whether stage 3a or 3b CKD (CMS/HHS-HCC)  7. Pulmonary hypertension (CMS/HHS-HCC)  8. Preoperative clearance   Plan   Preoperative clearance, cleared from a cardiovascular standpoint for surgery. STOP Eliquis for 3 days and restart 2 days after the surgery.  Paroxysmal a-fib, STOP Eliquis and continue amiodarone and metoprolol for rhythm and rate control  Multifactorial generalized weakness, recommend physical therapy Lower extremities lymphedema, continue support stockings, elevation, and diuretics OSA, recommend CPAP if indicated,  sleep study, and weight loss HTN, reasonably controlled, continue metoprolol, consider ARB or HCTZ Morbid obesity, recommend exercise, healthy diet, and weight loss Have the patient follow-up in 6 months  Return in about 6 months (around 10/15/2023).    Reproductive/Obstetrics                             Anesthesia Physical Anesthesia Plan  ASA: 3  Anesthesia Plan: General   Post-op Pain Management:    Induction: Intravenous  PONV Risk Score and Plan: 3 and Propofol infusion, TIVA and Treatment may vary due to age or medical condition  Airway Management Planned: Natural Airway  Additional Equipment:   Intra-op Plan:   Post-operative Plan:   Informed Consent: I have reviewed the patients History and Physical, chart, labs and discussed the procedure including the risks, benefits and alternatives for the proposed anesthesia with the patient or authorized representative who has indicated his/her understanding and acceptance.       Plan Discussed with: CRNA  Anesthesia Plan Comments: (LMA/GETA backup discussed.  Patient consented for risks of anesthesia including but not limited to:  - adverse reactions to medications - damage to eyes, teeth, lips or other oral mucosa - nerve damage due to positioning  - sore throat or hoarseness - damage to heart, brain, nerves, lungs, other parts of body or loss of life  Informed patient about role of CRNA in peri- and intra-operative care.  Patient voiced understanding.)        Anesthesia Quick Evaluation

## 2023-05-13 NOTE — Op Note (Signed)
[facility] Gastroenterology Patient Name: Becky Trevino Procedure Date: 05/13/2023 11:04 AM MRN: [id number] Account #: [id number] Date of Birth: 04-26-1946 Admit Type: Outpatient Age: 77 Room: [facility] ENDO ROOM 3 Gender: Female Note Status: Finalized Instrument Name: Upper Endoscope [id number] Procedure:             Upper GI endoscopy Indications:           Dysphagia Providers:             [name] MD, MD Medicines:             Monitored Anesthesia Care Complications:         No immediate complications. Procedure:             Pre-Anesthesia Assessment:                        - Prior to the procedure, a History and Physical was                         performed, and patient medications, allergies and                         sensitivities were reviewed. The patient's tolerance                         of previous anesthesia was reviewed.                        - The risks and benefits of the procedure and the                         sedation options and risks were discussed with the                         patient. All questions were answered and informed                         consent was obtained.                        - ASA Grade Assessment: II - A patient with mild                         systemic disease.                        After obtaining informed consent, the endoscope was                         passed under direct vision. Throughout the procedure,                         the patient's blood pressure, pulse, and oxygen                         saturations were monitored continuously. The Endoscope                         was introduced through the mouth, and advanced to the  third part of duodenum. The upper GI endoscopy was                         accomplished with ease. The patient tolerated the                         procedure well. Findings:      The examined duodenum was normal.      The stomach was normal.      The cardia  and gastric fundus were normal on retroflexion.      No appreciable esophageal motility was noted. In addition, a patulous       lower esophageal sphincter was found. There was no resistance to       endoscope advancement into the stomach. The Z-line was regular. The       gastroesophageal junction and cardia were normal on retroflexed view. Impression:            - Normal examined duodenum.                        - Normal stomach.                        - Esophageal motility disorder.                        - No specimens collected. Recommendation:        - Discharge patient to home (with escort).                        - Resume previous diet.                        - Continue present medications.                        - Return to GI clinic as previously scheduled. Procedure Code(s):     --- Professional ---                        [phone], Esophagogastroduodenoscopy, flexible,                         transoral; diagnostic, including collection of                         specimen(s) by brushing or washing, when performed                         (separate procedure) Diagnosis Code(s):     --- Professional ---                        K22.4, Dyskinesia of esophagus                        R13.10, Dysphagia, unspecified CPT copyright 2022 American Medical Association. All rights reserved. The codes documented in this report are preliminary and upon coder review may  be revised to meet current compliance requirements. [name], MD [name] MD, MD 05/13/2023 11:16:31 AM This report has been signed electronically. Number of Addenda: 0 Note Initiated On: 05/13/2023 11:04 AM Estimated Blood  Loss:  Estimated blood loss: none.      [facility]

## 2023-05-13 NOTE — Anesthesia Postprocedure Evaluation (Signed)
Anesthesia Post Note  Patient: Becky Trevino  Procedure(s) Performed: ESOPHAGOGASTRODUODENOSCOPY (EGD) WITH PROPOFOL  Patient location during evaluation: PACU Anesthesia Type: General Level of consciousness: awake and alert, oriented and patient cooperative Pain management: pain level controlled Vital Signs Assessment: post-procedure vital signs reviewed and stable Respiratory status: spontaneous breathing, nonlabored ventilation and respiratory function stable Cardiovascular status: blood pressure returned to baseline and stable Postop Assessment: adequate PO intake Anesthetic complications: no   No notable events documented.   Last Vitals:  Vitals:   05/13/23 1125 05/13/23 1135  BP: 120/66 122/64  Pulse: (!) 58 (!) 58  Resp: 14 18  Temp:  36.4 C  SpO2: 100% 100%    Last Pain:  Vitals:   05/13/23 1135  TempSrc: Temporal  PainSc: 0-No pain                 Reed Breech

## 2023-05-14 ENCOUNTER — Encounter: Payer: Self-pay | Admitting: Gastroenterology

## 2023-05-25 ENCOUNTER — Ambulatory Visit: Payer: Medicare Other | Admitting: Physician Assistant

## 2023-05-25 DIAGNOSIS — Z86718 Personal history of other venous thrombosis and embolism: Secondary | ICD-10-CM | POA: Diagnosis not present

## 2023-05-25 DIAGNOSIS — L84 Corns and callosities: Secondary | ICD-10-CM | POA: Diagnosis not present

## 2023-05-25 DIAGNOSIS — B351 Tinea unguium: Secondary | ICD-10-CM | POA: Diagnosis not present

## 2023-05-25 DIAGNOSIS — M2042 Other hammer toe(s) (acquired), left foot: Secondary | ICD-10-CM | POA: Diagnosis not present

## 2023-05-25 DIAGNOSIS — Z96651 Presence of right artificial knee joint: Secondary | ICD-10-CM | POA: Diagnosis not present

## 2023-05-25 DIAGNOSIS — M1610 Unilateral primary osteoarthritis, unspecified hip: Secondary | ICD-10-CM | POA: Diagnosis not present

## 2023-05-25 DIAGNOSIS — R262 Difficulty in walking, not elsewhere classified: Secondary | ICD-10-CM | POA: Diagnosis not present

## 2023-05-25 DIAGNOSIS — M12811 Other specific arthropathies, not elsewhere classified, right shoulder: Secondary | ICD-10-CM | POA: Diagnosis not present

## 2023-05-25 DIAGNOSIS — E1142 Type 2 diabetes mellitus with diabetic polyneuropathy: Secondary | ICD-10-CM | POA: Diagnosis not present

## 2023-05-25 DIAGNOSIS — I739 Peripheral vascular disease, unspecified: Secondary | ICD-10-CM | POA: Diagnosis not present

## 2023-05-25 DIAGNOSIS — M75101 Unspecified rotator cuff tear or rupture of right shoulder, not specified as traumatic: Secondary | ICD-10-CM | POA: Diagnosis not present

## 2023-05-25 DIAGNOSIS — M2041 Other hammer toe(s) (acquired), right foot: Secondary | ICD-10-CM | POA: Diagnosis not present

## 2023-05-25 DIAGNOSIS — M2011 Hallux valgus (acquired), right foot: Secondary | ICD-10-CM | POA: Diagnosis not present

## 2023-05-25 DIAGNOSIS — M2012 Hallux valgus (acquired), left foot: Secondary | ICD-10-CM | POA: Diagnosis not present

## 2023-05-25 DIAGNOSIS — M247 Protrusio acetabuli: Secondary | ICD-10-CM | POA: Diagnosis not present

## 2023-05-25 DIAGNOSIS — L851 Acquired keratosis [keratoderma] palmaris et plantaris: Secondary | ICD-10-CM | POA: Diagnosis not present

## 2023-05-26 ENCOUNTER — Telehealth: Payer: Self-pay | Admitting: Family Medicine

## 2023-05-26 NOTE — Telephone Encounter (Signed)
Becky Trevino with [employer]  is calling back to check on the status of the documents she faxed in for the pt CPAP machine. Please advise   Phone number: [phone]

## 2023-05-27 NOTE — Telephone Encounter (Signed)
Paperwork received this morning. Once signed I will fax back.

## 2023-05-28 ENCOUNTER — Other Ambulatory Visit: Payer: Self-pay | Admitting: Family Medicine

## 2023-05-28 DIAGNOSIS — G4709 Other insomnia: Secondary | ICD-10-CM

## 2023-06-01 NOTE — Telephone Encounter (Signed)
Copied from CRM [id number]. Topic: General - Other >> May 29, 2023  5:22 PM Santiya F wrote: Reason for CRM: [employer] is calling in requesting the status of a request they sent over for office notes from pt's most recent visit, a recent sleep study that is signed by provider as well as a prescription for a CPAP machine with pressure settings.

## 2023-06-10 NOTE — Progress Notes (Signed)
Celso Amy, PA-C [address]  [address]  [place], [place] [address]  Main: [phone]  Fax: [phone]   Primary Care Physician: Alba Cory, MD  Primary Gastroenterologist:  Celso Amy, PA-C / Dr. [name]    CC:  F/U Dysphagia  HPI: Becky Trevino is a 77 y.o. female returns for 25-month follow-up of dysphagia.  EGD done by Dr. Tobi Bastos 05/13/2023 was normal.  No evidence of esophageal stricture.  Esophageal dysmotility was suspected.  She was given trial of PPI.  Upper GI series 11/2022 showed severe esophageal dysmotility, stasis of barium in the esophagus, moderate hiatal hernia with narrowing at GE junction, and patulous esophagus.  Gastric emptying was normal.  Currently taking omeprazole 40 Mg once daily.  Her acid reflux and dysphagia symptoms have improved since she started this medication.  She has not had any recent episodes of dysphagia.  She had a negative colonoscopy in 2012.  Also had negative screening Cologuard test in the past few years.  She declines to schedule any further screening colonoscopies.  Wheelchair dependent.  Current Outpatient Medications  Medication Sig Dispense Refill   ACCU-CHEK GUIDE test strip TEST ONCE A DAY AS DIRECTED 50 strip 1   acetaminophen (TYLENOL) 650 MG CR tablet Take 650-1,300 mg by mouth 2 (two) times daily as needed for pain.     amiodarone (PACERONE) 200 MG tablet Take 200 mg by mouth daily.     amoxicillin (AMOXIL) 500 MG capsule Take 500 mg by mouth 2 (two) times daily.     apixaban (ELIQUIS) 5 MG TABS tablet Take 1 tablet by mouth 2 (two) times daily.     atorvastatin (LIPITOR) 40 MG tablet Take 1 tablet (40 mg total) by mouth every evening. 90 tablet 1   blood glucose meter kit and supplies 1 each by Other route daily at 12 noon. Dispense based on patient and insurance preference. Check fsbs once a day 1 each 0   Calcium Carbonate-Vitamin D 600-400 MG-UNIT tablet Take 1 tablet by mouth 2 (two) times daily. 60  tablet 0   dapagliflozin propanediol (FARXIGA) 10 MG TABS tablet Take 1 tablet (10 mg total) by mouth daily before breakfast. Patient receives via [employer] Patient Assistance 90 tablet 3   escitalopram (LEXAPRO) 5 MG tablet TAKE 1 TABLET (5 MG TOTAL) BY MOUTH DAILY. 90 tablet 1   ferrous sulfate 325 (65 FE) MG EC tablet TAKE 1 TABLET BY MOUTH EVERY DAY 90 tablet 3   fluticasone (FLONASE) 50 MCG/ACT nasal spray SPRAY 2 SPRAYS INTO EACH NOSTRIL EVERY DAY 48 mL 0   hydroquinone 4 % cream Apply topically at bedtime.     latanoprost (XALATAN) 0.005 % ophthalmic solution SMARTSIG:In Eye(s)     levothyroxine (SYNTHROID) 50 MCG tablet Take 1 tablet (50 mcg total) by mouth daily. But take two tablets once a week 100 tablet 0   Magnesium Oxide 500 MG CAPS Take 500 mg by mouth daily.      metoprolol succinate (TOPROL-XL) 50 MG 24 hr tablet Take 50 mg by mouth daily.     oxybutynin (DITROPAN-XL) 10 MG 24 hr tablet TAKE 1 TABLET BY MOUTH EVERY DAY 90 tablet 2   Oxycodone HCl 10 MG TABS Take 1 tablet by mouth 4 (four) times daily as needed.     polyethylene glycol (MIRALAX / GLYCOLAX) 17 g packet Take 17 g by mouth daily. (Patient taking differently: Take 17 g by mouth every other day.) 14 each 0  potassium chloride (KLOR-CON) 10 MEQ tablet Take 10 mEq by mouth daily.     SIMBRINZA 1-0.2 % SUSP Apply to eye.     sodium chloride (OCEAN) 0.65 % SOLN nasal spray Place 1 spray into both nostrils 3 (three) times daily. 30 mL 0   torsemide (DEMADEX) 20 MG tablet TAKE 1 TABLET BY MOUTH EVERY DAY 90 tablet 1   traZODone (DESYREL) 50 MG tablet TAKE 0.5-1 TABLETS (25-50 MG TOTAL) BY MOUTH AT BEDTIME AS NEEDED. FOR SLEEP 90 tablet 0   omeprazole (PRILOSEC) 40 MG capsule Take 1 capsule (40 mg total) by mouth daily. 90 capsule 3   No current facility-administered medications for this visit.    Allergies as of 06/11/2023 - Review Complete 06/11/2023  Allergen Reaction Noted   Lisinopril Swelling 04/10/2015    Past  Medical History:  Diagnosis Date   Cervical spondylosis    Chronic kidney disease    Stage 3   DDD (degenerative disc disease), cervical    [facility]   Diabetes mellitus without complication (HCC)    Diverticulosis    Hyperlipidemia    Hypertension    Intertrigo    Leg cramps    Leg varices    Obesity    OSA (obstructive sleep apnea)    Symptomatic menopausal or female climacteric states    Syncope     Past Surgical History:  Procedure Laterality Date   ABDOMINAL HYSTERECTOMY  1993   Total   BUNIONECTOMY Bilateral 1993   ESOPHAGOGASTRODUODENOSCOPY (EGD) WITH PROPOFOL N/A 05/13/2023   Procedure: ESOPHAGOGASTRODUODENOSCOPY (EGD) WITH PROPOFOL;  Surgeon: [name], MD;  Location: [facility] ENDOSCOPY;  Service: Gastroenterology;  Laterality: N/A;   I & D KNEE WITH POLY EXCHANGE Right 11/19/2017   Procedure: RIGHT KNEE POLY EXCHANGE WITH IRRIGATION AND DEBRIDEMENT;  Surgeon: Kennedy Bucker, MD;  Location: [facility] ORS;  Service: Orthopedics;  Laterality: Right;   IR FLUORO GUIDED NEEDLE PLC ASPIRATION/INJECTION LOC  09/19/2021   JOINT REPLACEMENT     bilateral knee   LAMINECTOMY  11/14/2013    Cervical Fusion , [facility], Dr. Timoteo Ace   LASER ABLATION Bilateral 07/29/2012   Dr. Wyn Quaker   LOWER EXTREMITY ANGIOGRAPHY Right 11/02/2019   Procedure: Lower Extremity Angiography;  Surgeon: Renford Dills, MD;  Location: [facility] INVASIVE CV LAB;  Service: Cardiovascular;  Laterality: Right;   ORIF ANKLE FRACTURE Right 09/02/2019   Procedure: OPEN REDUCTION INTERNAL FIXATION (ORIF) ANKLE FRACTURE BIMALLEOLAR;  Surgeon: Rosetta Posner, DPM;  Location: [facility] ORS;  Service: Podiatry;  Laterality: Right;   PERIPHERAL VASCULAR THROMBECTOMY Right 03/02/2020   Procedure: PERIPHERAL VASCULAR THROMBECTOMY;  Surgeon: Annice Needy, MD;  Location: [facility] INVASIVE CV LAB;  Service: Cardiovascular;  Laterality: Right;   REPLACEMENT TOTAL KNEE Left 03/2010   Dr. Rosita Kea   TEE WITHOUT CARDIOVERSION N/A 09/19/2021   Procedure:  TRANSESOPHAGEAL ECHOCARDIOGRAM (TEE);  Surgeon: Lamar Blinks, MD;  Location: [facility] ORS;  Service: Cardiovascular;  Laterality: N/A;   TOTAL HIP ARTHROPLASTY Left 12/11/2015   Procedure: TOTAL HIP ARTHROPLASTY ANTERIOR APPROACH;  Surgeon: Kennedy Bucker, MD;  Location: [facility] ORS;  Service: Orthopedics;  Laterality: Left;   TOTAL KNEE ARTHROPLASTY Right 03/11/2016   Procedure: TOTAL KNEE ARTHROPLASTY;  Surgeon: Kennedy Bucker, MD;  Location: [facility] ORS;  Service: Orthopedics;  Laterality: Right;    Review of Systems:    All systems reviewed and negative except where noted in HPI.   Physical Examination:   BP 117/77   Pulse 62   Temp 98.1 F (36.7 C)  Ht 5\' 11"  (1.803 m)   Wt 231 lb (104.8 kg)   BMI 32.22 kg/m   General: Well-nourished, obese female, sitting in a motorized wheelchair, in no acute distress.  Not able to get on exam table. Psych: Alert and cooperative, normal mood and affect.  Answers questions appropriately. No exam performed.  Imaging Studies: See HPI.  Assessment and Plan:   Becky Trevino is a 77 y.o. y/o female returns for follow-up of dysphagia, due to severe esophageal dysmotility.  EGD showed no evidence of esophageal stricture.  Upper GI series showed severe esophageal dysmotility, patulous esophagus, and moderate hiatal hernia.  1.  Severe esophageal dysmotility - improved on PPI  I gave patient education handout from [facility] regarding esophageal dysmotility.  Lengthy discussion with patient regarding education and treatment.  Recommend eat small frequent meals.  Eat sitting up.  Try Gaviscon 30 mL after each meal.   3.  GERD - improved on PPI  Continue omeprazole 40 Mg once daily  Recommend Lifestyle Modifications to prevent Acid Reflux.  Rec. Avoid coffee, sodas, peppermint, citrus fruits, and spicey foods.  Avoid eating 2-3 hours before bedtime.   Celso Amy, PA-C  Follow up 1 year to refill medication or as needed based on symptoms.

## 2023-06-11 ENCOUNTER — Ambulatory Visit: Payer: Medicare Other | Admitting: Physician Assistant

## 2023-06-11 ENCOUNTER — Encounter: Payer: Self-pay | Admitting: Physician Assistant

## 2023-06-11 VITALS — BP 117/77 | HR 62 | Temp 98.1°F | Ht 71.0 in | Wt 231.0 lb

## 2023-06-11 DIAGNOSIS — K219 Gastro-esophageal reflux disease without esophagitis: Secondary | ICD-10-CM | POA: Diagnosis not present

## 2023-06-11 DIAGNOSIS — K224 Dyskinesia of esophagus: Secondary | ICD-10-CM

## 2023-06-11 MED ORDER — OMEPRAZOLE 40 MG PO CPDR
40.0000 mg | DELAYED_RELEASE_CAPSULE | Freq: Every day | ORAL | 3 refills | Status: DC
Start: 2023-06-11 — End: 2024-06-06

## 2023-06-15 DIAGNOSIS — H40153 Residual stage of open-angle glaucoma, bilateral: Secondary | ICD-10-CM | POA: Diagnosis not present

## 2023-06-15 LAB — OPHTHALMOLOGY REPORT-SCANNED

## 2023-06-19 ENCOUNTER — Telehealth: Payer: Self-pay | Admitting: Family Medicine

## 2023-06-19 NOTE — Telephone Encounter (Signed)
Copied from CRM [id number]. Topic: General - Inquiry >> Jun 19, 2023  1:57 PM Lennox Pippins wrote: Patient has called, asking for [name] to give her a call as soon as possible for some information needed for her CPAP machine  Patients callback # [phone]

## 2023-06-19 NOTE — Telephone Encounter (Signed)
Transferred call to Nurse Carollee Herter

## 2023-06-22 ENCOUNTER — Telehealth: Payer: Self-pay

## 2023-06-22 ENCOUNTER — Telehealth: Payer: Self-pay | Admitting: Family Medicine

## 2023-06-22 NOTE — Telephone Encounter (Signed)
Becky Trevino with [facility] is calling in because they need a new prescription for a CPAP Machine for pt. [employer] used to supply the machine and additional supplies but they are no longer contracted with [employer] and cannot provide supplies. Phone: [phone] Fax: [phone]

## 2023-06-22 NOTE — Telephone Encounter (Signed)
Becky Trevino returned call and stated she was unable to get her CPAP machine to turn on as it's battery is dead but she plugged it up and if it comes back on and she is able, she will call us with the pressure setting so we can get her new prescription sent in for her.

## 2023-06-22 NOTE — Telephone Encounter (Signed)
Contacted patient to verify new company needed for CPAP prescription for machine/supplies. Patient confirmed [facility]. Patient was unsure of the pressure settings of her current machine and she will contact us back with that information in order to proceed.

## 2023-06-25 ENCOUNTER — Ambulatory Visit: Payer: Medicare Other | Attending: Infectious Diseases | Admitting: Infectious Diseases

## 2023-06-25 ENCOUNTER — Encounter: Payer: Self-pay | Admitting: Infectious Diseases

## 2023-06-25 VITALS — BP 116/65 | HR 54 | Temp 97.8°F

## 2023-06-25 DIAGNOSIS — Z993 Dependence on wheelchair: Secondary | ICD-10-CM | POA: Diagnosis not present

## 2023-06-25 DIAGNOSIS — T8453XD Infection and inflammatory reaction due to internal right knee prosthesis, subsequent encounter: Secondary | ICD-10-CM

## 2023-06-25 DIAGNOSIS — B951 Streptococcus, group B, as the cause of diseases classified elsewhere: Secondary | ICD-10-CM | POA: Insufficient documentation

## 2023-06-25 DIAGNOSIS — I890 Lymphedema, not elsewhere classified: Secondary | ICD-10-CM | POA: Insufficient documentation

## 2023-06-25 DIAGNOSIS — Z7901 Long term (current) use of anticoagulants: Secondary | ICD-10-CM | POA: Insufficient documentation

## 2023-06-25 DIAGNOSIS — E1122 Type 2 diabetes mellitus with diabetic chronic kidney disease: Secondary | ICD-10-CM | POA: Diagnosis not present

## 2023-06-25 DIAGNOSIS — Z8619 Personal history of other infectious and parasitic diseases: Secondary | ICD-10-CM

## 2023-06-25 DIAGNOSIS — R1310 Dysphagia, unspecified: Secondary | ICD-10-CM | POA: Diagnosis not present

## 2023-06-25 DIAGNOSIS — Z792 Long term (current) use of antibiotics: Secondary | ICD-10-CM | POA: Insufficient documentation

## 2023-06-25 DIAGNOSIS — I4891 Unspecified atrial fibrillation: Secondary | ICD-10-CM | POA: Diagnosis not present

## 2023-06-25 DIAGNOSIS — E785 Hyperlipidemia, unspecified: Secondary | ICD-10-CM | POA: Insufficient documentation

## 2023-06-25 DIAGNOSIS — Z79890 Hormone replacement therapy (postmenopausal): Secondary | ICD-10-CM | POA: Diagnosis not present

## 2023-06-25 DIAGNOSIS — E039 Hypothyroidism, unspecified: Secondary | ICD-10-CM | POA: Diagnosis not present

## 2023-06-25 DIAGNOSIS — T8453XA Infection and inflammatory reaction due to internal right knee prosthesis, initial encounter: Secondary | ICD-10-CM | POA: Insufficient documentation

## 2023-06-25 DIAGNOSIS — N183 Chronic kidney disease, stage 3 unspecified: Secondary | ICD-10-CM | POA: Insufficient documentation

## 2023-06-25 DIAGNOSIS — T8450XD Infection and inflammatory reaction due to unspecified internal joint prosthesis, subsequent encounter: Secondary | ICD-10-CM

## 2023-06-25 DIAGNOSIS — Z23 Encounter for immunization: Secondary | ICD-10-CM

## 2023-06-25 DIAGNOSIS — I129 Hypertensive chronic kidney disease with stage 1 through stage 4 chronic kidney disease, or unspecified chronic kidney disease: Secondary | ICD-10-CM | POA: Diagnosis not present

## 2023-06-25 MED ORDER — AMOXICILLIN 500 MG PO CAPS: 500.0000 mg | ORAL_CAPSULE | Freq: Two times a day (BID) | ORAL | 12 refills | Status: DC

## 2023-06-25 NOTE — Progress Notes (Signed)
NAME: Becky Trevino  DOB: [birth year]  MRN: [id number]  Date/Time: 06/25/2023 11:28 AM  Subjective:   ?pt here for follow up visit- She has a h/o recurrent group B streptococcal bacteremia X 3 and prosthetic joint infection- has been on suppressive amoxicillin since  Becky Trevino is a 77 y.o. 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, recurrent Group B strep bacteremia intially from the wound ORIF diabetes mellitus, hyperlipidemia, hypertension, degenerative disc , left gluteal pelvic hematoma after a fall in Nov 2020, cervical decompression surgery march 2015, DVT rt lower extremity , has IVC filter-  lymphedema legs PT was treated with 6 weeks of IV ceftriaxone which she completed on 10/26/21 for recurrent group B streptococcal bacteremia with unclear source possible rt hip septic arthritis After finishing iv ceftriaxone she has been on amoxicillin 500mg  TID and now  BID She is doing well  She underwent an endoscopy in August due to trouble swallowing. The endoscopy did not reveal any strictures.  Becky Trevino is primarily wheelchair-bound but occasionally uses a walker. She is able to transfer from bed to wheelchair, go to the bathroom, and shower independently. She lives with her husband and daughter, who assist her as needed.  She also has chronic venous edema, for which she wears a wrap. She has tried other treatments, including compression stockings, but found them too tight and difficult to use.  Patient has a complicated Infection history    In 2017 had rt knee replacement   In February 2019 she had swelling of the knee and an aspiration revealed 59,000 WBCs with 98% polymorphs.  Cultures were negative as well as for crystal analysis.  Sed rate was 76.  On March 7 , 2019 she had an incision and drainage and polyethylene exchange of the right knee.  The cultures grew coag negative staph.  PICC line was placed and  she was given vancomycin and ceftriaxone for 6 weeks,  , culture had coag neg staph, was on suppressive doxy + rifampin because of retained prosthesis. Since  oct 2019  has been on Doxy suppressive therapy which was discontinued Jan 2023    08/07/19-08/09/19 after  she passed out at work and hit a metal pole She developed a left gluteal hematoma and 295 ml of dark purple blood aspirated by ortho on Dec , 11 2020 - no culture sent   Feb 2021- Group B strep bacteremia Rt ankle fracture ORIF Dec 2020-  non healing wound with GBS and acinetobacter in Feb 2021- treated with 4 weeks of Iv antibiotic- unasyn   09/02/20-09/14/20 for left upper thigh cellulitis, MRSA bacteremia and group B strep bacteremia and left lower lobe infiltrate due to MRSA She got IV vanco followed by Daptomycin for a total of 22 days    07/16/21 -07/20/21 admitted for sepsis/SIRS- had epistaxis prior to admission no source identified. Blood culture neg  No pneumonia, no cellulitis, no Uti Chronic rt hip arthritis-pain- 7cc aspirated and cell count was initially reported as  73K - (37 L, 47M and 41% N) but repeat was 1300 and then 900 NO septic arthritis   09/14/21-09/24/21- hospitalized for fever and sepsis- GB strep bacteremia TEE 09/19/21  neg Rt hip aspiration -neg   Past Medical History:  Diagnosis Date   Cervical spondylosis    Chronic kidney disease    Stage 3   DDD (degenerative disc disease), cervical    [facility]   Diabetes mellitus without complication (HCC)    Diverticulosis    Hyperlipidemia    Hypertension    Intertrigo    Leg cramps    Leg varices    Obesity    OSA (obstructive sleep apnea)    Symptomatic menopausal or female climacteric states    Syncope     Past Surgical History:  Procedure Laterality Date   ABDOMINAL HYSTERECTOMY  1993   Total   BUNIONECTOMY Bilateral 1993   ESOPHAGOGASTRODUODENOSCOPY (EGD) WITH PROPOFOL N/A 05/13/2023   Procedure: ESOPHAGOGASTRODUODENOSCOPY (EGD)  WITH PROPOFOL;  Surgeon: [name], MD;  Location: [facility] ENDOSCOPY;  Service: Gastroenterology;  Laterality: N/A;   I & D KNEE WITH POLY EXCHANGE Right 11/19/2017   Procedure: RIGHT KNEE POLY EXCHANGE WITH IRRIGATION AND DEBRIDEMENT;  Surgeon: Kennedy Bucker, MD;  Location: [facility] ORS;  Service: Orthopedics;  Laterality: Right;   IR FLUORO GUIDED NEEDLE PLC ASPIRATION/INJECTION LOC  09/19/2021   JOINT REPLACEMENT     bilateral knee   LAMINECTOMY  11/14/2013    Cervical Fusion , [facility], Dr. Timoteo Ace   LASER ABLATION Bilateral 07/29/2012   Dr. Wyn Quaker   LOWER EXTREMITY ANGIOGRAPHY Right 11/02/2019   Procedure: Lower Extremity Angiography;  Surgeon: Renford Dills, MD;  Location: [facility] INVASIVE CV LAB;  Service: Cardiovascular;  Laterality: Right;   ORIF ANKLE FRACTURE Right 09/02/2019   Procedure: OPEN REDUCTION INTERNAL FIXATION (ORIF) ANKLE FRACTURE BIMALLEOLAR;  Surgeon: Rosetta Posner, DPM;  Location: [facility] ORS;  Service: Podiatry;  Laterality: Right;   PERIPHERAL VASCULAR THROMBECTOMY Right 03/02/2020   Procedure: PERIPHERAL VASCULAR THROMBECTOMY;  Surgeon: Annice Needy, MD;  Location: [facility] INVASIVE CV LAB;  Service: Cardiovascular;  Laterality: Right;   REPLACEMENT TOTAL KNEE Left 03/2010   Dr. Rosita Kea   TEE WITHOUT CARDIOVERSION N/A 09/19/2021   Procedure: TRANSESOPHAGEAL ECHOCARDIOGRAM (TEE);  Surgeon: Lamar Blinks, MD;  Location: [facility] ORS;  Service: Cardiovascular;  Laterality: N/A;   TOTAL HIP ARTHROPLASTY Left 12/11/2015   Procedure: TOTAL HIP ARTHROPLASTY ANTERIOR APPROACH;  Surgeon: Kennedy Bucker, MD;  Location: [facility] ORS;  Service: Orthopedics;  Laterality: Left;   TOTAL KNEE ARTHROPLASTY Right 03/11/2016   Procedure: TOTAL KNEE ARTHROPLASTY;  Surgeon: Kennedy Bucker, MD;  Location: [facility] ORS;  Service: Orthopedics;  Laterality: Right;    Social History   Socioeconomic History   Marital status: Married    Spouse name: [name] William   Number of children: 1   Years of education: Not on file    Highest education level: Not on file  Occupational History   Occupation: Retired  Tobacco Use   Smoking status: Never   Smokeless tobacco: Never  Vaping Use   Vaping status: Never Used  Substance and Sexual Activity   Alcohol use: No    Alcohol/week: 0.0 standard drinks of alcohol   Drug use: No   Sexual activity: Yes    Partners: Male  Other Topics Concern   Not on file  Social History Narrative   Married, one adopted grown child    Social Determinants of Health   Financial Resource Strain: High Risk (08/05/2022)   Overall Financial Resource Strain (CARDIA)    Difficulty of Paying Living Expenses: Very hard  Food Insecurity: No Food Insecurity (09/02/2022)   Hunger Vital Sign    Worried About Running Out of Food in the Last Year: Never true    Ran Out of Food in the Last Year: Never true  Transportation Needs: No Transportation Needs (09/02/2022)   PRAPARE - Transportation  Lack of Transportation (Medical): No    Lack of Transportation (Non-Medical): No  Physical Activity: Insufficiently Active (08/05/2022)   Exercise Vital Sign    Days of Exercise per Week: 5 days    Minutes of Exercise per Session: 20 min  Stress: No Stress Concern Present (08/05/2022)   [employer] of Occupational Health - Occupational Stress Questionnaire    Feeling of Stress : Not at all  Social Connections: Socially Integrated (08/05/2022)   Social Connection and Isolation Panel [NHANES]    Frequency of Communication with Friends and Family: Twice a week    Frequency of Social Gatherings with Friends and Family: Once a week    Attends Religious Services: More than 4 times per year    Active Member of [employer] or Organizations: Yes    Attends Banker Meetings: 1 to 4 times per year    Marital Status: Married  Catering manager Violence: Not At Risk (08/05/2022)   Humiliation, Afraid, Rape, and Kick questionnaire    Fear of Current or Ex-Partner: No    Emotionally Abused: No     Physically Abused: No    Sexually Abused: No    Family History  Problem Relation Age of Onset   Diabetes Mother    Hyperlipidemia Mother    Hypertension Mother    Diabetes Father    Hyperlipidemia Father    Hypertension Father    Obesity Father    Hypertension Sister    Hyperlipidemia Sister    Hyperlipidemia Sister    Breast cancer Neg Hx    Allergies  Allergen Reactions   Lisinopril Swelling   I? Current Outpatient Medications  Medication Sig Dispense Refill   ACCU-CHEK GUIDE test strip TEST ONCE A DAY AS DIRECTED 50 strip 1   acetaminophen (TYLENOL) 650 MG CR tablet Take 650-1,300 mg by mouth 2 (two) times daily as needed for pain.     amiodarone (PACERONE) 200 MG tablet Take 200 mg by mouth daily.     amoxicillin (AMOXIL) 500 MG capsule Take 500 mg by mouth 2 (two) times daily.     apixaban (ELIQUIS) 5 MG TABS tablet Take 1 tablet by mouth 2 (two) times daily.     atorvastatin (LIPITOR) 40 MG tablet Take 1 tablet (40 mg total) by mouth every evening. 90 tablet 1   blood glucose meter kit and supplies 1 each by Other route daily at 12 noon. Dispense based on patient and insurance preference. Check fsbs once a day 1 each 0   Calcium Carbonate-Vitamin D 600-400 MG-UNIT tablet Take 1 tablet by mouth 2 (two) times daily. 60 tablet 0   dapagliflozin propanediol (FARXIGA) 10 MG TABS tablet Take 1 tablet (10 mg total) by mouth daily before breakfast. Patient receives via [employer] Patient Assistance 90 tablet 3   escitalopram (LEXAPRO) 5 MG tablet TAKE 1 TABLET (5 MG TOTAL) BY MOUTH DAILY. 90 tablet 1   ferrous sulfate 325 (65 FE) MG EC tablet TAKE 1 TABLET BY MOUTH EVERY DAY 90 tablet 3   fluticasone (FLONASE) 50 MCG/ACT nasal spray SPRAY 2 SPRAYS INTO EACH NOSTRIL EVERY DAY 48 mL 0   hydroquinone 4 % cream Apply topically at bedtime.     latanoprost (XALATAN) 0.005 % ophthalmic solution SMARTSIG:In Eye(s)     levothyroxine (SYNTHROID) 50 MCG tablet Take 1 tablet (50 mcg total) by  mouth daily. But take two tablets once a week 100 tablet 0   Magnesium Oxide 500 MG CAPS Take 500 mg by mouth  daily.      metoprolol succinate (TOPROL-XL) 50 MG 24 hr tablet Take 50 mg by mouth daily.     omeprazole (PRILOSEC) 40 MG capsule Take 1 capsule (40 mg total) by mouth daily. 90 capsule 3   oxybutynin (DITROPAN-XL) 10 MG 24 hr tablet TAKE 1 TABLET BY MOUTH EVERY DAY 90 tablet 2   Oxycodone HCl 10 MG TABS Take 1 tablet by mouth 4 (four) times daily as needed.     polyethylene glycol (MIRALAX / GLYCOLAX) 17 g packet Take 17 g by mouth daily. (Patient taking differently: Take 17 g by mouth every other day.) 14 each 0   potassium chloride (KLOR-CON) 10 MEQ tablet Take 10 mEq by mouth daily.     SIMBRINZA 1-0.2 % SUSP Apply to eye.     sodium chloride (OCEAN) 0.65 % SOLN nasal spray Place 1 spray into both nostrils 3 (three) times daily. 30 mL 0   torsemide (DEMADEX) 20 MG tablet TAKE 1 TABLET BY MOUTH EVERY DAY 90 tablet 1   traZODone (DESYREL) 50 MG tablet TAKE 0.5-1 TABLETS (25-50 MG TOTAL) BY MOUTH AT BEDTIME AS NEEDED. FOR SLEEP 90 tablet 0   No current facility-administered medications for this visit.     Abtx:  Anti-infectives (From admission, onward)    None       REVIEW OF SYSTEMS:  Const: negative fever, negative chills, negative weight loss Eyes: negative diplopia or visual changes, negative eye pain ENT: negative coryza, negative sore throat Resp: negative cough, hemoptysis, dyspnea Cards: negative for chest pain, palpitations, lower extremity edema GU: negative for frequency, dysuria and hematuria GI: Negative for abdominal pain, diarrhea, bleeding, constipation Skin: negative for rash and pruritus Heme: negative for easy bruising and gum/nose bleeding MS: decreased mobility because of Joint disease Neurolo:negative for headaches, dizziness, vertigo, memory problems  Psych: negative for feelings of anxiety, depression  Endocrine:   diabetes Allergy/Immunology-  as above ? Objective:  VITALS:  BP 116/65   Pulse (!) 54   Temp 97.8 F (36.6 C) (Temporal)   SpO2 97%   PHYSICAL EXAM:  General: Alert, cooperative, no distress, appears stated age. In wheel chair HEENT: Dentures present in both the upper and lower jaws. Eyes appear normal. CHEST: Lungs clear to auscultation. CARDIOVASCULAR: Heart rhythm steady and regular. ABDOMEN: No tenderness on palpation. MUSCULOSKELETAL: Scar tissue on knees with slight swelling but no redness or tenderness. Hip area shows slight swelling. Extremities: has compression wrap, edema legs Skin:limited examination  No rashes or lesions. Or bruising Lymph: Cervical, supraclavicular normal. Neurologic: Grossly non-focal   Pertinent Labs LABS Creatinine: 1.43 (02/2023) Hb: Normal (12/2022)   Impression/Recommendation Prosthetic Joint Infections No recent infections or hospitalizations. Stable on Amoxicillin prophylaxis. -Continue Amoxicillin  500mg  BID indefinitely.  Dysphagia Endoscopy in August 2024 showed no stricture. Symptoms possibly related to Oxycodone use. -Continue current management as advised by Gastroenterologist.  Chronic Kidney Disease Stable creatinine at 1.43. -Continue current management.  Venous Edema Managed with leg wrap. -Continue current management.  Inflammatory Markers ESR and CRP not checked in a year. -Order ESR and CRP to be drawn on 06/29/2023 with other labs.   H/o ? 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 ___________________________________________________ Discussed with patient, in detail Note:  This document was prepared using Dragon voice recognition software and may include unintentional dictation errors.

## 2023-06-25 NOTE — Patient Instructions (Signed)
Dear [name], thank you for coming in for your routine follow-up. I'm pleased to report that your overall health status is stable. You've been doing well on Amoxicillin for your prosthetic joint infections, and there have been no recent infections or hospitalizations. Your endoscopy in August did not reveal any strictures, which is good news. Your chronic kidney disease is stable, and your venous edema is being managed with a leg wrap. We will be checking your inflammatory markers with a lab test soon.  YOUR PLAN:  -PROSTHETIC JOINT INFECTIONS: You have not had any recent infections or hospitalizations, which is a good sign. We will continue your current medication, Amoxicillin, to prevent any future infections.  -DYSPHAGIA: Your endoscopy did not reveal any strictures, which means there are no narrow areas in your esophagus that could be causing your swallowing difficulties. We will continue to manage this as advised by your Gastroenterologist.  -CHRONIC KIDNEY DISEASE: Your kidney function, as indicated by your creatinine level, is stable. We will continue your current management plan.  -VENOUS EDEMA: Your leg swelling is being managed with a leg wrap. We will continue this current management plan.  -INFLAMMATORY MARKERS: We will be checking your ESR and CRP, which are markers of inflammation in the body, with a lab test on 06/29/2023.  INSTRUCTIONS:  Please continue to take your medications as prescribed. We will be drawing labs, including ESR and CRP, on 06/29/2023 when you visit your oncologist Dr.Yu. Please make sure to come in for this. We will follow up in 1 year for your next routine visit.

## 2023-06-26 DIAGNOSIS — M21372 Foot drop, left foot: Secondary | ICD-10-CM | POA: Diagnosis not present

## 2023-06-26 DIAGNOSIS — I890 Lymphedema, not elsewhere classified: Secondary | ICD-10-CM | POA: Diagnosis not present

## 2023-06-26 DIAGNOSIS — M5033 Other cervical disc degeneration, cervicothoracic region: Secondary | ICD-10-CM | POA: Diagnosis not present

## 2023-06-26 DIAGNOSIS — M1990 Unspecified osteoarthritis, unspecified site: Secondary | ICD-10-CM | POA: Diagnosis not present

## 2023-06-26 DIAGNOSIS — N1831 Chronic kidney disease, stage 3a: Secondary | ICD-10-CM | POA: Diagnosis not present

## 2023-06-29 ENCOUNTER — Inpatient Hospital Stay: Payer: Medicare Other | Attending: Oncology

## 2023-06-29 ENCOUNTER — Other Ambulatory Visit: Payer: Self-pay | Admitting: Family Medicine

## 2023-06-29 DIAGNOSIS — T8450XD Infection and inflammatory reaction due to unspecified internal joint prosthesis, subsequent encounter: Secondary | ICD-10-CM

## 2023-06-29 DIAGNOSIS — I82501 Chronic embolism and thrombosis of unspecified deep veins of right lower extremity: Secondary | ICD-10-CM | POA: Insufficient documentation

## 2023-06-29 DIAGNOSIS — D631 Anemia in chronic kidney disease: Secondary | ICD-10-CM | POA: Diagnosis not present

## 2023-06-29 DIAGNOSIS — Z7901 Long term (current) use of anticoagulants: Secondary | ICD-10-CM | POA: Insufficient documentation

## 2023-06-29 DIAGNOSIS — N183 Chronic kidney disease, stage 3 unspecified: Secondary | ICD-10-CM | POA: Insufficient documentation

## 2023-06-29 DIAGNOSIS — I129 Hypertensive chronic kidney disease with stage 1 through stage 4 chronic kidney disease, or unspecified chronic kidney disease: Secondary | ICD-10-CM | POA: Insufficient documentation

## 2023-06-29 DIAGNOSIS — E1122 Type 2 diabetes mellitus with diabetic chronic kidney disease: Secondary | ICD-10-CM | POA: Insufficient documentation

## 2023-06-29 DIAGNOSIS — E039 Hypothyroidism, unspecified: Secondary | ICD-10-CM

## 2023-06-29 LAB — CBC WITH DIFFERENTIAL (CANCER CENTER ONLY)
Abs Immature Granulocytes: 0.03 10*3/uL (ref 0.00–0.07)
Basophils Absolute: 0 10*3/uL (ref 0.0–0.1)
Basophils Relative: 0 %
Eosinophils Absolute: 0.1 10*3/uL (ref 0.0–0.5)
Eosinophils Relative: 2 %
HCT: 38.8 % (ref 36.0–46.0)
Hemoglobin: 12.2 g/dL (ref 12.0–15.0)
Immature Granulocytes: 1 %
Lymphocytes Relative: 16 %
Lymphs Abs: 0.8 10*3/uL (ref 0.7–4.0)
MCH: 30.5 pg (ref 26.0–34.0)
MCHC: 31.4 g/dL (ref 30.0–36.0)
MCV: 97 fL (ref 80.0–100.0)
Monocytes Absolute: 0.4 10*3/uL (ref 0.1–1.0)
Monocytes Relative: 9 %
Neutro Abs: 3.5 10*3/uL (ref 1.7–7.7)
Neutrophils Relative %: 72 %
Platelet Count: 179 10*3/uL (ref 150–400)
RBC: 4 MIL/uL (ref 3.87–5.11)
RDW: 16.9 % — ABNORMAL HIGH (ref 11.5–15.5)
WBC Count: 4.8 10*3/uL (ref 4.0–10.5)
nRBC: 0 % (ref 0.0–0.2)

## 2023-06-29 LAB — IRON AND TIBC
Iron: 39 ug/dL (ref 28–170)
Saturation Ratios: 16 % (ref 10.4–31.8)
TIBC: 248 ug/dL — ABNORMAL LOW (ref 250–450)
UIBC: 209 ug/dL

## 2023-06-29 LAB — FERRITIN: Ferritin: 63 ng/mL (ref 11–307)

## 2023-06-29 LAB — C-REACTIVE PROTEIN: CRP: 4.2 mg/dL — ABNORMAL HIGH (ref <1.0)

## 2023-06-29 LAB — SEDIMENTATION RATE: Sed Rate: 79 mm/h — ABNORMAL HIGH (ref 0–30)

## 2023-07-01 ENCOUNTER — Other Ambulatory Visit: Payer: Self-pay

## 2023-07-01 ENCOUNTER — Inpatient Hospital Stay: Payer: Medicare Other | Admitting: Oncology

## 2023-07-01 VITALS — BP 122/84 | HR 53 | Temp 96.2°F | Wt 251.0 lb

## 2023-07-01 DIAGNOSIS — D631 Anemia in chronic kidney disease: Secondary | ICD-10-CM

## 2023-07-01 DIAGNOSIS — N183 Chronic kidney disease, stage 3 unspecified: Secondary | ICD-10-CM | POA: Diagnosis not present

## 2023-07-01 DIAGNOSIS — I129 Hypertensive chronic kidney disease with stage 1 through stage 4 chronic kidney disease, or unspecified chronic kidney disease: Secondary | ICD-10-CM | POA: Diagnosis not present

## 2023-07-01 DIAGNOSIS — N189 Chronic kidney disease, unspecified: Secondary | ICD-10-CM | POA: Diagnosis not present

## 2023-07-01 DIAGNOSIS — I87099 Postthrombotic syndrome with other complications of unspecified lower extremity: Secondary | ICD-10-CM

## 2023-07-01 DIAGNOSIS — N1832 Chronic kidney disease, stage 3b: Secondary | ICD-10-CM | POA: Diagnosis not present

## 2023-07-01 DIAGNOSIS — Z7901 Long term (current) use of anticoagulants: Secondary | ICD-10-CM | POA: Diagnosis not present

## 2023-07-01 DIAGNOSIS — I82501 Chronic embolism and thrombosis of unspecified deep veins of right lower extremity: Secondary | ICD-10-CM | POA: Diagnosis not present

## 2023-07-01 DIAGNOSIS — E1122 Type 2 diabetes mellitus with diabetic chronic kidney disease: Secondary | ICD-10-CM | POA: Diagnosis not present

## 2023-07-01 NOTE — Assessment & Plan Note (Signed)
Patient has IVC filter. Continue chronic anticoagulation with Eliquis 5 mg twice daily.- she also has paroxysmal A-Fib

## 2023-07-01 NOTE — Assessment & Plan Note (Signed)
Labs are reviewed and discussed with patient. Lab Results  Component Value Date   HGB 12.2 06/29/2023   TIBC 248 (L) 06/29/2023   IRONPCTSAT 16 06/29/2023   FERRITIN 63 06/29/2023   Hb and iron panel are stable. No need for IV venofer or erythropoitein Recommend patient to start oral iron supplementation ferrous sulfate 325 mg daily.

## 2023-07-01 NOTE — Progress Notes (Signed)
Hematology/Oncology Progress note [phone] [phone]      REASON FOR VISIT  follow-up for DVT. Anemia due to CKD  ASSESSMENT & PLAN:   Anemia due to chronic kidney disease Labs are reviewed and discussed with patient. Lab Results  Component Value Date   HGB 12.2 06/29/2023   TIBC 248 (L) 06/29/2023   IRONPCTSAT 16 06/29/2023   FERRITIN 63 06/29/2023   Hb and iron panel are stable. No need for IV venofer or erythropoitein Recommend patient to start oral iron supplementation ferrous sulfate 325 mg daily.  Chronic venous hypertension due to deep vein thrombosis (DVT) Patient has IVC filter. Continue chronic anticoagulation with Eliquis 5 mg twice daily.- she also has paroxysmal A-Fib  Chronic kidney disease (CKD), stage III (moderate) (HCC) Encourage oral hydration and avoid nephrotoxins.  No M protein on SPEP and light chain ratio slightly elevated, non specific. UPEP is pending   Orders Placed This Encounter  Procedures   Retic Panel    Standing Status:   Future    Standing Expiration Date:   06/30/2024   CBC with Differential (Cancer Center Only)    Standing Status:   Future    Standing Expiration Date:   06/30/2024   Ferritin    Standing Status:   Future    Standing Expiration Date:   06/30/2024   Iron and TIBC    Standing Status:   Future    Standing Expiration Date:   06/30/2024   Follow up in 6 months.  All questions were answered. The patient knows to call the clinic with any problems, questions or concerns.  [name], MD, PhD [facility] Hematology Oncology 07/01/2023    Pertinent hematology history 77 y.o. female with past medical history listed as below presents for follow-up of DVT. She was admitted from 10/27/2019-11/07/2019 for right lower extremity cellulitis/wound infection, bacteremia with group B streptococcus, right lower extremity DVT. # Patient had ankle fracture status post a recent right ankle ORIF on 09/02/2019 by  Dr. Joylene Grapes.  Patient has a wound VAC She was noticed to have right lower extremity swelling, mild erythema and warmth with associated tenderness. 11/24/2019 US venous right lower extremity showed a DVT within the right popliteal vein, with superficial thrombus seen at the saphenofemoral junction. Patient tells me that she was diagnosed with "blood clots" in August 2020.  At that time she had pain and swelling for about a week and went to ER for evaluation.    04/29/2019 ultrasound was negative for DVT, only showed a superficial thrombophlebitis involving a branch of the great saphenous vein in the proximal thigh.  She was fine to have right Baker's cyst.  Patient denies any immobilization factors contributing to this event.  Patient had elevated D-dimer at that time. Patient was started on Xarelto-starter kit by emergency room.  Patient was recommended to have US aorta iliac Doppler by primary care provider Dr. Carlynn Purl and I do not see the study was done.  Xarelto was continued outpatient for thrombophlebitis..  Patient was hospitalized again at the end of November 2020 due to syncope episodes which likely due to orthostatic hypotension.  Patient was noted to have anemia, CT abdomen pelvis to evaluate after trauma, showed left gluteal hematoma without signs of extravasation.  Xarelto was held during the hospitalization.  Saw cardiology Dr.Callwood.  Patient was seen by Dr. Carlynn Purl on 08/23/2019 and the Xarelto was stopped.   09/02/2019 patient slipped and obtained right ankle fracture.  Status post ORIF procedure  by Dr. Excell Seltzer.  Postop patient was recommended to start on Lovenox 40 mg daily injections prophylactically until her presentation to emergency room in February.  At that point, she was found to have a provoked right lower extremity DVT and she was seen by me during that admission.  Patient was recommended to start therapeutic Lovenox.  During the same hospitalization, patient developed spontaneous  iliopsoas hematoma and therapeutic Lovenox was discontinued. Patient had IVC filter placed on 11/02/2019.  Patient was recommended to have outpatient follow-up with me. Patient was recently seen by Dr. Carlynn Purl and was found to have worsening of right lower extremity swelling, Doppler ultrasound was obtained on 02/03/2020 which was positive for DVT in the right lower extremity, increased clot burden in the right lower extremity since 10/27/2019.  Thrombus extending from the right common femoral vein to the right calf.  Patient was referred back to me for evaluation and management.  Currently off anticoagulation due to recurrent hematoma.  IVC filter was placed.  No plan for filter retrieval due to risk of recurrent lower extremity thrombosis. Negative antiphospholipid syndrome antibodies, prothrombin gene mutation, factor V Leiden mutation.  INTERVAL HISTORY Becky Trevino is a 77 y.o. female who has above history reviewed by me today presents for follow up visit for DVT Patient was last seen by me in 2021.  Patient present to reestablish care. Patient has chronic lower extremity DVT, on chronic anticoagulation.  Patient also has IVC filter. Eliquis 5 mg twice daily.  She takes oral iron supplementation.  Chronic fatigue has improved.   Review of Systems  Constitutional:  Positive for fatigue. Negative for appetite change, chills and fever.  HENT:   Negative for hearing loss and voice change.   Eyes:  Negative for eye problems.  Respiratory:  Negative for chest tightness and cough.   Cardiovascular:  Positive for leg swelling. Negative for chest pain.       Hip/thigh swelling  Gastrointestinal:  Negative for abdominal distention, abdominal pain and blood in stool.  Endocrine: Negative for hot flashes.  Genitourinary:  Negative for difficulty urinating, frequency and nocturia.   Musculoskeletal:  Negative for arthralgias.  Skin:  Negative for itching and rash.  Neurological:  Negative for  extremity weakness.  Hematological:  Negative for adenopathy.  Psychiatric/Behavioral:  Negative for confusion.       Allergies  Allergen Reactions   Lisinopril Swelling     Past Medical History:  Diagnosis Date   Cervical spondylosis    Chronic kidney disease    Stage 3   DDD (degenerative disc disease), cervical    [facility]   Diabetes mellitus without complication (HCC)    Diverticulosis    Hyperlipidemia    Hypertension    Intertrigo    Leg cramps    Leg varices    Obesity    OSA (obstructive sleep apnea)    Symptomatic menopausal or female climacteric states    Syncope      Past Surgical History:  Procedure Laterality Date   ABDOMINAL HYSTERECTOMY  1993   Total   BUNIONECTOMY Bilateral 1993   ESOPHAGOGASTRODUODENOSCOPY (EGD) WITH PROPOFOL N/A 05/13/2023   Procedure: ESOPHAGOGASTRODUODENOSCOPY (EGD) WITH PROPOFOL;  Surgeon: [name], MD;  Location: [facility] ENDOSCOPY;  Service: Gastroenterology;  Laterality: N/A;   I & D KNEE WITH POLY EXCHANGE Right 11/19/2017   Procedure: RIGHT KNEE POLY EXCHANGE WITH IRRIGATION AND DEBRIDEMENT;  Surgeon: Kennedy Bucker, MD;  Location: [facility] ORS;  Service: Orthopedics;  Laterality: Right;  IR FLUORO GUIDED NEEDLE PLC ASPIRATION/INJECTION LOC  09/19/2021   JOINT REPLACEMENT     bilateral knee   LAMINECTOMY  11/14/2013    Cervical Fusion , [facility], Dr. Timoteo Ace   LASER ABLATION Bilateral 07/29/2012   Dr. Wyn Quaker   LOWER EXTREMITY ANGIOGRAPHY Right 11/02/2019   Procedure: Lower Extremity Angiography;  Surgeon: Renford Dills, MD;  Location: [facility] INVASIVE CV LAB;  Service: Cardiovascular;  Laterality: Right;   ORIF ANKLE FRACTURE Right 09/02/2019   Procedure: OPEN REDUCTION INTERNAL FIXATION (ORIF) ANKLE FRACTURE BIMALLEOLAR;  Surgeon: Rosetta Posner, DPM;  Location: [facility] ORS;  Service: Podiatry;  Laterality: Right;   PERIPHERAL VASCULAR THROMBECTOMY Right 03/02/2020   Procedure: PERIPHERAL VASCULAR THROMBECTOMY;  Surgeon: Annice Needy,  MD;  Location: [facility] INVASIVE CV LAB;  Service: Cardiovascular;  Laterality: Right;   REPLACEMENT TOTAL KNEE Left 03/2010   Dr. Rosita Kea   TEE WITHOUT CARDIOVERSION N/A 09/19/2021   Procedure: TRANSESOPHAGEAL ECHOCARDIOGRAM (TEE);  Surgeon: Lamar Blinks, MD;  Location: [facility] ORS;  Service: Cardiovascular;  Laterality: N/A;   TOTAL HIP ARTHROPLASTY Left 12/11/2015   Procedure: TOTAL HIP ARTHROPLASTY ANTERIOR APPROACH;  Surgeon: Kennedy Bucker, MD;  Location: [facility] ORS;  Service: Orthopedics;  Laterality: Left;   TOTAL KNEE ARTHROPLASTY Right 03/11/2016   Procedure: TOTAL KNEE ARTHROPLASTY;  Surgeon: Kennedy Bucker, MD;  Location: [facility] ORS;  Service: Orthopedics;  Laterality: Right;    Social History   Socioeconomic History   Marital status: Married    Spouse name: Andromeda Poppen   Number of children: 1   Years of education: Not on file   Highest education level: Not on file  Occupational History   Occupation: Retired  Tobacco Use   Smoking status: Never   Smokeless tobacco: Never  Vaping Use   Vaping status: Never Used  Substance and Sexual Activity   Alcohol use: No    Alcohol/week: 0.0 standard drinks of alcohol   Drug use: No   Sexual activity: Yes    Partners: Male  Other Topics Concern   Not on file  Social History Narrative   Married, one adopted grown child    Social Determinants of Health   Financial Resource Strain: High Risk (08/05/2022)   Overall Financial Resource Strain (CARDIA)    Difficulty of Paying Living Expenses: Very hard  Food Insecurity: No Food Insecurity (09/02/2022)   Hunger Vital Sign    Worried About Running Out of Food in the Last Year: Never true    Ran Out of Food in the Last Year: Never true  Transportation Needs: No Transportation Needs (09/02/2022)   PRAPARE - Administrator, Civil Service (Medical): No    Lack of Transportation (Non-Medical): No  Physical Activity: Insufficiently Active (08/05/2022)   Exercise Vital Sign    Days of  Exercise per Week: 5 days    Minutes of Exercise per Session: 20 min  Stress: No Stress Concern Present (08/05/2022)   [employer] of Occupational Health - Occupational Stress Questionnaire    Feeling of Stress : Not at all  Social Connections: Socially Integrated (08/05/2022)   Social Connection and Isolation Panel [NHANES]    Frequency of Communication with Friends and Family: Twice a week    Frequency of Social Gatherings with Friends and Family: Once a week    Attends Religious Services: More than 4 times per year    Active Member of [employer] or Organizations: Yes    Attends Banker Meetings: 1 to 4 times per year  Marital Status: Married  Catering manager Violence: Not At Risk (08/05/2022)   Humiliation, Afraid, Rape, and Kick questionnaire    Fear of Current or Ex-Partner: No    Emotionally Abused: No    Physically Abused: No    Sexually Abused: No    Family History  Problem Relation Age of Onset   Diabetes Mother    Hyperlipidemia Mother    Hypertension Mother    Diabetes Father    Hyperlipidemia Father    Hypertension Father    Obesity Father    Hypertension Sister    Hyperlipidemia Sister    Hyperlipidemia Sister    Breast cancer Neg Hx      Current Outpatient Medications:    ACCU-CHEK GUIDE test strip, TEST ONCE A DAY AS DIRECTED, Disp: 50 strip, Rfl: 1   acetaminophen (TYLENOL) 650 MG CR tablet, Take 650-1,300 mg by mouth 2 (two) times daily as needed for pain., Disp: , Rfl:    amiodarone (PACERONE) 200 MG tablet, Take 200 mg by mouth daily., Disp: , Rfl:    amoxicillin (AMOXIL) 500 MG capsule, Take 1 capsule (500 mg total) by mouth 2 (two) times daily., Disp: 60 capsule, Rfl: 12   apixaban (ELIQUIS) 5 MG TABS tablet, Take 1 tablet by mouth 2 (two) times daily., Disp: , Rfl:    atorvastatin (LIPITOR) 40 MG tablet, Take 1 tablet (40 mg total) by mouth every evening., Disp: 90 tablet, Rfl: 1   blood glucose meter kit and supplies, 1 each by Other  route daily at 12 noon. Dispense based on patient and insurance preference. Check fsbs once a day, Disp: 1 each, Rfl: 0   Calcium Carbonate-Vitamin D 600-400 MG-UNIT tablet, Take 1 tablet by mouth 2 (two) times daily., Disp: 60 tablet, Rfl: 0   dapagliflozin propanediol (FARXIGA) 10 MG TABS tablet, Take 1 tablet (10 mg total) by mouth daily before breakfast. Patient receives via [place] Patient Assistance, Disp: 90 tablet, Rfl: 3   escitalopram (LEXAPRO) 5 MG tablet, TAKE 1 TABLET (5 MG TOTAL) BY MOUTH DAILY., Disp: 90 tablet, Rfl: 1   ferrous sulfate 325 (65 FE) MG EC tablet, TAKE 1 TABLET BY MOUTH EVERY DAY, Disp: 90 tablet, Rfl: 3   fluticasone (FLONASE) 50 MCG/ACT nasal spray, SPRAY 2 SPRAYS INTO EACH NOSTRIL EVERY DAY, Disp: 48 mL, Rfl: 0   hydroquinone 4 % cream, Apply topically at bedtime., Disp: , Rfl:    latanoprost (XALATAN) 0.005 % ophthalmic solution, SMARTSIG:In Eye(s), Disp: , Rfl:    levothyroxine (SYNTHROID) 50 MCG tablet, Take 1 tablet (50 mcg total) by mouth daily. But take two tablets once a week, Disp: 100 tablet, Rfl: 0   Magnesium Oxide 500 MG CAPS, Take 500 mg by mouth daily. , Disp: , Rfl:    metoprolol succinate (TOPROL-XL) 50 MG 24 hr tablet, Take 50 mg by mouth daily., Disp: , Rfl:    omeprazole (PRILOSEC) 40 MG capsule, Take 1 capsule (40 mg total) by mouth daily., Disp: 90 capsule, Rfl: 3   oxybutynin (DITROPAN-XL) 10 MG 24 hr tablet, TAKE 1 TABLET BY MOUTH EVERY DAY, Disp: 90 tablet, Rfl: 2   Oxycodone HCl 10 MG TABS, Take 1 tablet by mouth 4 (four) times daily as needed., Disp: , Rfl:    polyethylene glycol (MIRALAX / GLYCOLAX) 17 g packet, Take 17 g by mouth daily. (Patient taking differently: Take 17 g by mouth every other day.), Disp: 14 each, Rfl: 0   potassium chloride (KLOR-CON) 10 MEQ tablet, Take 10  mEq by mouth daily., Disp: , Rfl:    SIMBRINZA 1-0.2 % SUSP, Apply to eye., Disp: , Rfl:    sodium chloride (OCEAN) 0.65 % SOLN nasal spray, Place 1 spray into both  nostrils 3 (three) times daily., Disp: 30 mL, Rfl: 0   torsemide (DEMADEX) 20 MG tablet, TAKE 1 TABLET BY MOUTH EVERY DAY, Disp: 90 tablet, Rfl: 1   traZODone (DESYREL) 50 MG tablet, TAKE 0.5-1 TABLETS (25-50 MG TOTAL) BY MOUTH AT BEDTIME AS NEEDED. FOR SLEEP, Disp: 90 tablet, Rfl: 0  Physical exam:  Vitals:   07/01/23 0957  BP: 122/84  Pulse: (!) 53  Temp: (!) 96.2 F (35.7 C)  TempSrc: Tympanic  SpO2: 100%  Weight: 251 lb (113.9 kg)   Physical Exam Constitutional:      General: She is not in acute distress.    Appearance: She is obese.     Comments: Patient sits in the wheelchair  HENT:     Head: Normocephalic and atraumatic.  Eyes:     General: No scleral icterus. Cardiovascular:     Rate and Rhythm: Normal rate and regular rhythm.     Heart sounds: Normal heart sounds.  Pulmonary:     Effort: Pulmonary effort is normal. No respiratory distress.     Breath sounds: No wheezing.  Abdominal:     General: Bowel sounds are normal. There is no distension.     Palpations: Abdomen is soft.  Musculoskeletal:        General: Swelling present. No deformity. Normal range of motion.     Cervical back: Normal range of motion and neck supple.     Comments: Bilateral lower extremity swelling  Skin:    General: Skin is warm and dry.     Findings: No erythema or rash.  Neurological:     Mental Status: She is alert and oriented to person, place, and time. Mental status is at baseline.     Cranial Nerves: No cranial nerve deficit.     Coordination: Coordination normal.  Psychiatric:        Mood and Affect: Mood normal.        Latest Ref Rng & Units 03/02/2023   10:30 AM  CMP  Glucose 65 - 99 mg/dL 75   BUN 7 - 25 mg/dL 21   Creatinine 1.61 - 1.00 mg/dL 0.96   Sodium 045 - 409 mmol/L 141   Potassium 3.5 - 5.3 mmol/L 4.6   Chloride 98 - 110 mmol/L 104   CO2 20 - 32 mmol/L 32   Calcium 8.6 - 10.4 mg/dL 9.1   Total Protein 6.1 - 8.1 g/dL 7.9   Total Bilirubin 0.2 - 1.2 mg/dL  0.5   AST 10 - 35 U/L 20   ALT 6 - 29 U/L 13       Latest Ref Rng & Units 06/29/2023    9:59 AM  CBC  WBC 4.0 - 10.5 K/uL 4.8   Hemoglobin 12.0 - 15.0 g/dL 81.1   Hematocrit 91.4 - 46.0 % 38.8   Platelets 150 - 400 K/uL 179     RADIOGRAPHIC STUDIES: I have personally reviewed the radiological images as listed and agreed with the findings in the report. No results found.

## 2023-07-01 NOTE — Progress Notes (Unsigned)
Name: Becky Trevino   MRN: [id number]    DOB: [birth year]   Date:07/02/2023       Progress Note  Subjective  Chief Complaint  Follow Up  HPI  DMII with renal manifestation and obesity:  she denies polyuria, polyphagia or polydipsia. A1C is normal.  She has CKI - last GFR dropped a little while at [facility] down to 37 but she was also slightly dehydrated ( lab is closed now) She  has  dyslipidemia and obesity . She cannot take ACE due to angioedema, A1C is at goal, taking [place] for CKI . She states statins for dyslipidemia. She has neuropathy , she is under the care of podiatrist   Esophageal dysmotility: seen by Dr. Tobi Bastos, advised to take PPI but has not started yet due to possible side effects. Advised to take it and we will keep monitoring her labs.    Chronic DVT  right popliteal vein: diagnosed 10/27/2019,  she has an IV filter. Dr. Cathie Hoops had stopped Eliquis but had psoas hematoma but since resolution of hematoma Dr. Juliann Pares placed her back on it and no side effects. Unchanged   Hyperlipidemia: taking Atorvastatin daily and denies side effects. No chest pain , no muscles pain. LDL was slightly at 75    Insomnia/OSA :Last Echo done by Dr. Juliann Pares showed mild pulmonary hypertension. She is trying to get a new CPAP machine.   OA: multiple joints, sees Ortho, Dr. Rosita Kea . She had revision of right knee replacement March 2019 because of infection. Still under the care of Ortho now also seeing Dr. Owens Loffler . She is still on daily antibiotics - Amoxicillin - denies side effects    Left femoral nerve palsy:  seen by Dr. Rosita Kea, Dr. Sherryll Burger, now seeing neurosurgeon at [facility] , Dr. Katrinka Blazing. She is able to walk short distances but s wearing brace because left knee is unstable due to femoral nerve palsy. She still  needs assistance bathing, cannot drive, cannot transfer alone . She was diagnosed with a hematoma of left iliopsoas during hospital stay 10/2019 , but she had repeat  CT pelvis 01/09/2020 that showed  resolution of hematoma. She is back on Eliquis and off aspirin now  Dr. Katrinka Blazing reviewed her case and advised against doing the surgery to decompress the nerve. She continues to have lower extremity edema that improves with massage She went to vascular surgeon and she is wearing compression pumps twice daily , also has leg wraps and was given a wrap for her right thigh however it causes pain and only goes up to above her right knee, she will discuss other options with vascular surgeon    CKI stage III: GFR was down to 38 , under the care of nephrologist   Atherosclerosis of Aorta/Afib/PAD: she is on statin and back on Eliquis Denies side effects Last LDL slightly above goal.  Rate controlled . She is compliant with visits to cardiologist - Dr. Juliann Pares   Hypothyroidism: on levothyroxine 50 mcg daily, last TSH was high again and we will recheck today   Anxiety/dysthymia : she is on lexapro, making a point of sitting outside in the sun. She states no longer having  panic attacks. 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) 12/01/2022   Interstitial lung disease (HCC) 12/01/2022   Atherosclerosis of native arteries of extremity with intermittent claudication (HCC) 10/16/2022   Anemia due to chronic kidney disease 08/26/2022   Hypothyroidism (acquired) 01/27/2022   PAD (peripheral artery disease) (HCC) 09/27/2021   AF (paroxysmal atrial fibrillation) (HCC) 09/15/2021   Primary osteoarthritis of right hip 07/18/2021   Atherosclerosis of aorta  (HCC) 06/07/2021   Mild protein-calorie malnutrition (HCC)    Hyperbilirubinemia    Injury of left femoral nerve 08/28/2020   Lymphedema 06/25/2020   History of pelvic hematoma 02/10/2020   Chronic venous hypertension due to deep vein thrombosis (DVT) 10/30/2019   Normocytic anemia 08/07/2019   Chronic infection of right knee (HCC) 11/25/2018   Infection and inflammatory reaction due to internal joint prosthesis, subsequent encounter 01/25/2018   Infection of total right knee replacement (HCC) 11/19/2017   Type 2 diabetes mellitus with stage 3b chronic kidney disease, without long-term current use of insulin (HCC) 11/06/2016   Primary osteoarthritis of knee 03/11/2016   Primary osteoarthritis of left hip 12/11/2015   Essential hypertension 04/21/2015   Chronic kidney disease (CKD), stage III (moderate) (HCC) 04/21/2015   Chronic venous insufficiency 04/21/2015   DDD (degenerative disc disease), cervical 04/21/2015   Diabetes mellitus with renal manifestation (HCC) 04/21/2015   Dyslipidemia 04/21/2015   Bladder cystocele 04/21/2015   Urinary incontinence 04/21/2015   Leg varices 04/21/2015   Insomnia 04/21/2015   Eczema intertrigo 04/21/2015   Morbid obesity (HCC) 04/21/2015   OSA (obstructive sleep apnea) 04/21/2015   Osteoarthritis of multiple joints 04/21/2015   H/O Spinal surgery 11/14/2013   Detrusor muscle hypertonia 11/07/2013    Past Surgical History:  Procedure Laterality Date   ABDOMINAL HYSTERECTOMY  1993   Total   BUNIONECTOMY Bilateral 1993   ESOPHAGOGASTRODUODENOSCOPY (EGD) WITH PROPOFOL N/A 05/13/2023   Procedure: ESOPHAGOGASTRODUODENOSCOPY (EGD) WITH PROPOFOL;  Surgeon: [name], MD;  Location: [facility] ENDOSCOPY;  Service: Gastroenterology;  Laterality: N/A;   I & D KNEE WITH POLY EXCHANGE Right 11/19/2017   Procedure: RIGHT KNEE POLY EXCHANGE WITH IRRIGATION AND DEBRIDEMENT;  Surgeon: Kennedy Bucker, MD;  Location: [facility] ORS;  Service: Orthopedics;  Laterality:  Right;   IR FLUORO GUIDED NEEDLE PLC ASPIRATION/INJECTION LOC  09/19/2021   JOINT REPLACEMENT     bilateral knee   LAMINECTOMY  11/14/2013    Cervical Fusion , [facility], Dr. Timoteo Ace   LASER ABLATION Bilateral 07/29/2012   Dr. Wyn Quaker   LOWER EXTREMITY ANGIOGRAPHY Right 11/02/2019   Procedure: Lower Extremity Angiography;  Surgeon: Renford Dills, MD;  Location: [facility] INVASIVE CV LAB;  Service: Cardiovascular;  Laterality: Right;   ORIF ANKLE FRACTURE Right 09/02/2019   Procedure: OPEN REDUCTION INTERNAL FIXATION (ORIF) ANKLE FRACTURE BIMALLEOLAR;  Surgeon: Rosetta Posner, DPM;  Location: [facility] ORS;  Service: Podiatry;  Laterality: Right;   PERIPHERAL VASCULAR THROMBECTOMY Right 03/02/2020   Procedure: PERIPHERAL VASCULAR THROMBECTOMY;  Surgeon: Annice Needy, MD;  Location: [facility] INVASIVE CV LAB;  Service: Cardiovascular;  Laterality: Right;   REPLACEMENT TOTAL KNEE Left 03/2010   Dr. Rosita Kea   TEE WITHOUT CARDIOVERSION N/A 09/19/2021   Procedure: TRANSESOPHAGEAL ECHOCARDIOGRAM (TEE);  Surgeon: Lamar Blinks, MD;  Location: [facility] ORS;  Service: Cardiovascular;  Laterality: N/A;   TOTAL HIP ARTHROPLASTY Left 12/11/2015   Procedure: TOTAL HIP  ARTHROPLASTY ANTERIOR APPROACH;  Surgeon: Kennedy Bucker, MD;  Location: [facility] ORS;  Service: Orthopedics;  Laterality: Left;   TOTAL KNEE ARTHROPLASTY Right 03/11/2016   Procedure: TOTAL KNEE ARTHROPLASTY;  Surgeon: Kennedy Bucker, MD;  Location: [facility] ORS;  Service: Orthopedics;  Laterality: Right;    Family History  Problem Relation Age of Onset   Diabetes Mother    Hyperlipidemia Mother    Hypertension Mother    Diabetes Father    Hyperlipidemia Father    Hypertension Father    Obesity Father    Hypertension Sister    Hyperlipidemia Sister    Hyperlipidemia Sister    Breast cancer Neg Hx     Social History   Tobacco Use   Smoking status: Never   Smokeless tobacco: Never  Substance Use Topics   Alcohol use: No    Alcohol/week: 0.0 standard drinks of alcohol      Current Outpatient Medications:    ACCU-CHEK GUIDE test strip, TEST ONCE A DAY AS DIRECTED, Disp: 50 strip, Rfl: 1   acetaminophen (TYLENOL) 650 MG CR tablet, Take 650-1,300 mg by mouth 2 (two) times daily as needed for pain., Disp: , Rfl:    amiodarone (PACERONE) 200 MG tablet, Take 200 mg by mouth daily., Disp: , Rfl:    amoxicillin (AMOXIL) 500 MG capsule, Take 1 capsule (500 mg total) by mouth 2 (two) times daily., Disp: 60 capsule, Rfl: 12   apixaban (ELIQUIS) 5 MG TABS tablet, Take 1 tablet by mouth 2 (two) times daily., Disp: , Rfl:    atorvastatin (LIPITOR) 40 MG tablet, Take 1 tablet (40 mg total) by mouth every evening., Disp: 90 tablet, Rfl: 1   blood glucose meter kit and supplies, 1 each by Other route daily at 12 noon. Dispense based on patient and insurance preference. Check fsbs once a day, Disp: 1 each, Rfl: 0   Calcium Carbonate-Vitamin D 600-400 MG-UNIT tablet, Take 1 tablet by mouth 2 (two) times daily., Disp: 60 tablet, Rfl: 0   dapagliflozin propanediol (FARXIGA) 10 MG TABS tablet, Take 1 tablet (10 mg total) by mouth daily before breakfast. Patient receives via [employer] Patient Assistance, Disp: 90 tablet, Rfl: 3   escitalopram (LEXAPRO) 5 MG tablet, TAKE 1 TABLET (5 MG TOTAL) BY MOUTH DAILY., Disp: 90 tablet, Rfl: 1   ferrous sulfate 325 (65 FE) MG EC tablet, TAKE 1 TABLET BY MOUTH EVERY DAY, Disp: 90 tablet, Rfl: 3   fluticasone (FLONASE) 50 MCG/ACT nasal spray, SPRAY 2 SPRAYS INTO EACH NOSTRIL EVERY DAY, Disp: 48 mL, Rfl: 0   hydroquinone 4 % cream, Apply topically at bedtime., Disp: , Rfl:    latanoprost (XALATAN) 0.005 % ophthalmic solution, SMARTSIG:In Eye(s), Disp: , Rfl:    levothyroxine (SYNTHROID) 50 MCG tablet, Take 1 tablet (50 mcg total) by mouth daily. But take two tablets once a week, Disp: 100 tablet, Rfl: 0   Magnesium Oxide 500 MG CAPS, Take 500 mg by mouth daily. , Disp: , Rfl:    metoprolol succinate (TOPROL-XL) 50 MG 24 hr tablet, Take 50 mg by mouth  daily., Disp: , Rfl:    omeprazole (PRILOSEC) 40 MG capsule, Take 1 capsule (40 mg total) by mouth daily., Disp: 90 capsule, Rfl: 3   oxybutynin (DITROPAN-XL) 10 MG 24 hr tablet, TAKE 1 TABLET BY MOUTH EVERY DAY, Disp: 90 tablet, Rfl: 2   oxyCODONE-acetaminophen (PERCOCET/ROXICET) 5-325 MG tablet, Take by mouth., Disp: , Rfl:    polyethylene glycol (MIRALAX / GLYCOLAX) 17 g packet, Take 17  g by mouth daily. (Patient taking differently: Take 17 g by mouth every other day.), Disp: 14 each, Rfl: 0   potassium chloride (KLOR-CON) 10 MEQ tablet, Take 10 mEq by mouth daily., Disp: , Rfl:    SIMBRINZA 1-0.2 % SUSP, Apply to eye., Disp: , Rfl:    sodium chloride (OCEAN) 0.65 % SOLN nasal spray, Place 1 spray into both nostrils 3 (three) times daily., Disp: 30 mL, Rfl: 0   torsemide (DEMADEX) 20 MG tablet, TAKE 1 TABLET BY MOUTH EVERY DAY, Disp: 90 tablet, Rfl: 1   traZODone (DESYREL) 50 MG tablet, TAKE 0.5-1 TABLETS (25-50 MG TOTAL) BY MOUTH AT BEDTIME AS NEEDED. FOR SLEEP, Disp: 90 tablet, Rfl: 0  Allergies  Allergen Reactions   Lisinopril Swelling    I personally reviewed active problem list, medication list, allergies, family history, social history, health maintenance with the patient/caregiver today.   ROS  Ten systems reviewed and is negative except as mentioned in HPI    Objective  Vitals:   07/02/23 0941  BP: 116/70  Pulse: 64  Resp: 16  Temp: 97.8 F (36.6 C)  TempSrc: Oral  SpO2: 99%  Weight: 251 lb 4.8 oz (114 kg)  Height: 5\' 11"  (1.803 m)    Body mass index is 35.05 kg/m.  Physical Exam  Constitutional: Patient appears well-developed and well-nourished. Obese  No distress.  HEENT: head atraumatic, normocephalic, pupils equal and reactive to light, neck supple Cardiovascular: Normal rate, regular rhythm and normal heart sounds.  No murmur heard. Wearing lower extremity wraps to help with edema, right thigh is more swollen than left but seems to be improving   Pulmonary/Chest: Effort normal and breath sounds normal. No respiratory distress. Abdominal: Soft.  There is no tenderness. Psychiatric: Patient has a normal mood and affect. behavior is normal. Judgment and thought content normal.    PHQ2/9:    07/02/2023    9:43 AM 06/25/2023   11:01 AM 04/15/2023   10:57 AM 03/02/2023    9:48 AM 12/01/2022   11:42 AM  Depression screen PHQ 2/9  Decreased Interest 0 0 0 0 0  Down, Depressed, Hopeless 0 0 0 0 0  PHQ - 2 Score 0 0 0 0 0  Altered sleeping 0  0 0 0  Tired, decreased energy 0  0 0 0  Change in appetite 0  0 0 0  Feeling bad or failure about yourself  0  0 0 0  Trouble concentrating 0  0 0 0  Moving slowly or fidgety/restless 0  0 0 0  Suicidal thoughts 0  0 0 0  PHQ-9 Score 0  0 0 0    phq 9 is negative   Fall Risk:    07/02/2023    9:43 AM 06/25/2023   11:01 AM 04/15/2023   10:57 AM 03/02/2023    9:48 AM 12/01/2022   11:42 AM  Fall Risk   Falls in the past year? 0 0 0 0 0  Number falls in past yr:    0 0  Injury with Fall?    0 0  Risk for fall due to : Impaired mobility;Impaired balance/gait;Orthopedic patient No Fall Risks Impaired mobility Impaired mobility;Impaired balance/gait Impaired balance/gait;Impaired mobility  Follow up Falls prevention discussed;Education provided;Falls evaluation completed Falls evaluation completed Falls prevention discussed;Education provided;Falls evaluation completed Falls prevention discussed Falls prevention discussed     Assessment & Plan  1. Type 2 diabetes mellitus with diabetic neuropathy, without long-term current use of insulin (HCC)  -  POCT HgB A1C  2. Stage 3b chronic kidney disease (HCC)  Under the care of nephrologist   3. AF (paroxysmal atrial fibrillation) (HCC)  Rate controlled   4. Atherosclerosis of aorta (HCC)  - atorvastatin (LIPITOR) 40 MG tablet; Take 1 tablet (40 mg total) by mouth every evening.  Dispense: 90 tablet; Refill: 1  5. Morbid obesity  (HCC)  Weight is up 20 lbs.   6. Chronic deep vein thrombosis (DVT) of tibial vein of right lower extremity (HCC)  On Eliquis  7. Chronic infection of right knee (HCC)  Under the care of ID, last CRP improved but sed rate was up , still on antibiotics   8. B12 deficiency  Recheck today   9. Hypothyroidism (acquired)  - TSH  10. Dyslipidemia  - atorvastatin (LIPITOR) 40 MG tablet; Take 1 tablet (40 mg total) by mouth every evening.  Dispense: 90 tablet; Refill: 1  11. Esophageal dysmotility  Needs to start PPI  12. OSA (obstructive sleep apnea)  We will send order today

## 2023-07-01 NOTE — Assessment & Plan Note (Addendum)
Encourage oral hydration and avoid nephrotoxins.  No M protein on SPEP and light chain ratio slightly elevated, non specific. UPEP is pending

## 2023-07-02 ENCOUNTER — Encounter: Payer: Self-pay | Admitting: Family Medicine

## 2023-07-02 ENCOUNTER — Telehealth: Payer: Self-pay

## 2023-07-02 ENCOUNTER — Ambulatory Visit: Payer: Medicare Other | Admitting: Family Medicine

## 2023-07-02 VITALS — BP 116/70 | HR 64 | Temp 97.8°F | Resp 16 | Ht 71.0 in | Wt 251.3 lb

## 2023-07-02 DIAGNOSIS — I82541 Chronic embolism and thrombosis of right tibial vein: Secondary | ICD-10-CM

## 2023-07-02 DIAGNOSIS — E785 Hyperlipidemia, unspecified: Secondary | ICD-10-CM | POA: Diagnosis not present

## 2023-07-02 DIAGNOSIS — G4733 Obstructive sleep apnea (adult) (pediatric): Secondary | ICD-10-CM | POA: Diagnosis not present

## 2023-07-02 DIAGNOSIS — E1140 Type 2 diabetes mellitus with diabetic neuropathy, unspecified: Secondary | ICD-10-CM | POA: Insufficient documentation

## 2023-07-02 DIAGNOSIS — K224 Dyskinesia of esophagus: Secondary | ICD-10-CM | POA: Diagnosis not present

## 2023-07-02 DIAGNOSIS — N1832 Chronic kidney disease, stage 3b: Secondary | ICD-10-CM | POA: Diagnosis not present

## 2023-07-02 DIAGNOSIS — I480 Paroxysmal atrial fibrillation: Secondary | ICD-10-CM

## 2023-07-02 DIAGNOSIS — E039 Hypothyroidism, unspecified: Secondary | ICD-10-CM | POA: Diagnosis not present

## 2023-07-02 DIAGNOSIS — E538 Deficiency of other specified B group vitamins: Secondary | ICD-10-CM | POA: Diagnosis not present

## 2023-07-02 DIAGNOSIS — I700 Atherosclerosis of aorta: Secondary | ICD-10-CM

## 2023-07-02 DIAGNOSIS — M009 Pyogenic arthritis, unspecified: Secondary | ICD-10-CM

## 2023-07-02 LAB — POCT GLYCOSYLATED HEMOGLOBIN (HGB A1C): Hemoglobin A1C: 5.7 % — AB (ref 4.0–5.6)

## 2023-07-02 MED ORDER — ATORVASTATIN CALCIUM 40 MG PO TABS
40.0000 mg | ORAL_TABLET | Freq: Every evening | ORAL | 1 refills | Status: DC
Start: 2023-07-02 — End: 2023-12-09

## 2023-07-02 NOTE — Telephone Encounter (Signed)
Spoke with Shawna Orleans at [facility] and she stated CPAP order and supplies are in process and someone will be in touch with patient to arrange delivery within a week.

## 2023-07-03 LAB — TSH: TSH: 3.68 m[IU]/L (ref 0.40–4.50)

## 2023-07-03 LAB — B12 AND FOLATE PANEL
Folate: 7.4 ng/mL
Vitamin B-12: 636 pg/mL (ref 200–1100)

## 2023-07-03 LAB — IFE+PROTEIN ELECTRO, 24-HR UR
% BETA, Urine: 42.2 %
ALPHA 1 URINE: 3.1 %
Albumin, U: 7.5 %
Alpha 2, Urine: 14.4 %
GAMMA GLOBULIN URINE: 32.7 %
Total Protein, Urine-Ur/day: 123 mg/(24.h) (ref 30–150)
Total Protein, Urine: 24.6 mg/dL
Total Volume: 500

## 2023-07-06 ENCOUNTER — Ambulatory Visit: Payer: Medicare Other

## 2023-07-06 DIAGNOSIS — Z719 Counseling, unspecified: Secondary | ICD-10-CM

## 2023-07-06 DIAGNOSIS — Z23 Encounter for immunization: Secondary | ICD-10-CM | POA: Diagnosis not present

## 2023-07-06 NOTE — Progress Notes (Addendum)
Homevisit to administer covid vaccine as requested by pt.  Had flu vaccine 06/25/23 per epic record.  See immunization administration record/screening signed by pt (sent for scanning).  VIS given and after vaccine care reviewed.  Spikevax [id number] formula given by Marin Roberts RN; tolerated well.  Updated NCIR given.  Twin sister who lives in home also vaccinated at today's homeviist.   Cherlynn Polo, RN

## 2023-07-08 NOTE — Progress Notes (Signed)
07/10/2023 11:06 AM   Becky Trevino [birth year] [id number]  Referring provider: Alba Cory, MD [address] [place],  [place] [address]  Urological history: 1. Mixed incontinence -contributing factors of age, obesity, sleep apnea (non-compliant), anxiety, depression, cystocele, diuretics, DM and DDD -Myrbetriq 50 mg daily, cost prohibitive -oxybutynin XL 10 mg daily   2. Right renal cyst -RUS (07/2022) 1.6 cm benign simple cyst in the superior pole   Chief Complaint  Patient presents with   Follow-up    1 year follow-up    HPI: Becky Trevino is a 77 y.o. female who presents today for 12 month follow up.  Previous records reviewed.   She is having 1-7 daytime voids, 1-2 episodes of nocturia with a mild urge to urinate.  She wears 1 depends daily.  She does engage in toilet mapping.  She only wears depends when she goes out and she states they are rarely wet.  Patient denies any modifying or aggravating factors.  Patient denies any recent UTI's, gross hematuria, dysuria or suprapubic/flank pain.  Patient denies any fevers, chills, nausea or vomiting.    PVR 15 mL  She denied any dry eye, dry mouth, constipation, mental status changes while taking the oxybutynin.  She states she has sisters on the same medication and they are doing well with that as well.  PMH: Past Medical History:  Diagnosis Date   Cervical spondylosis    Chronic kidney disease    Stage 3   DDD (degenerative disc disease), cervical    [facility]   Diabetes mellitus without complication (HCC)    Diverticulosis    Hyperlipidemia    Hypertension    Intertrigo    Leg cramps    Leg varices    Obesity    OSA (obstructive sleep apnea)    Symptomatic menopausal or female climacteric states    Syncope     Surgical History: Past Surgical History:  Procedure Laterality Date   ABDOMINAL HYSTERECTOMY  1993   Total   BUNIONECTOMY Bilateral 1993    ESOPHAGOGASTRODUODENOSCOPY (EGD) WITH PROPOFOL N/A 05/13/2023   Procedure: ESOPHAGOGASTRODUODENOSCOPY (EGD) WITH PROPOFOL;  Surgeon: [name], MD;  Location: [facility] ENDOSCOPY;  Service: Gastroenterology;  Laterality: N/A;   I & D KNEE WITH POLY EXCHANGE Right 11/19/2017   Procedure: RIGHT KNEE POLY EXCHANGE WITH IRRIGATION AND DEBRIDEMENT;  Surgeon: Kennedy Bucker, MD;  Location: [facility] ORS;  Service: Orthopedics;  Laterality: Right;   IR FLUORO GUIDED NEEDLE PLC ASPIRATION/INJECTION LOC  09/19/2021   JOINT REPLACEMENT     bilateral knee   LAMINECTOMY  11/14/2013    Cervical Fusion , [facility], Dr. Timoteo Ace   LASER ABLATION Bilateral 07/29/2012   Dr. Wyn Quaker   LOWER EXTREMITY ANGIOGRAPHY Right 11/02/2019   Procedure: Lower Extremity Angiography;  Surgeon: Renford Dills, MD;  Location: [facility] INVASIVE CV LAB;  Service: Cardiovascular;  Laterality: Right;   ORIF ANKLE FRACTURE Right 09/02/2019   Procedure: OPEN REDUCTION INTERNAL FIXATION (ORIF) ANKLE FRACTURE BIMALLEOLAR;  Surgeon: Rosetta Posner, DPM;  Location: [facility] ORS;  Service: Podiatry;  Laterality: Right;   PERIPHERAL VASCULAR THROMBECTOMY Right 03/02/2020   Procedure: PERIPHERAL VASCULAR THROMBECTOMY;  Surgeon: Annice Needy, MD;  Location: [facility] INVASIVE CV LAB;  Service: Cardiovascular;  Laterality: Right;   REPLACEMENT TOTAL KNEE Left 03/2010   Dr. Rosita Kea   TEE WITHOUT CARDIOVERSION N/A 09/19/2021   Procedure: TRANSESOPHAGEAL ECHOCARDIOGRAM (TEE);  Surgeon: Lamar Blinks, MD;  Location: [facility] ORS;  Service: Cardiovascular;  Laterality:  N/A;   TOTAL HIP ARTHROPLASTY Left 12/11/2015   Procedure: TOTAL HIP ARTHROPLASTY ANTERIOR APPROACH;  Surgeon: Kennedy Bucker, MD;  Location: [facility] ORS;  Service: Orthopedics;  Laterality: Left;   TOTAL KNEE ARTHROPLASTY Right 03/11/2016   Procedure: TOTAL KNEE ARTHROPLASTY;  Surgeon: Kennedy Bucker, MD;  Location: [facility] ORS;  Service: Orthopedics;  Laterality: Right;    Home Medications:  Allergies as of 07/10/2023        Reactions   Lisinopril Swelling        Medication List        Accurate as of July 10, 2023 11:06 AM. If you have any questions, ask your nurse or doctor.          Accu-Chek Guide test strip Generic drug: glucose blood TEST ONCE A DAY AS DIRECTED   acetaminophen 650 MG CR tablet Commonly known as: TYLENOL Take 650-1,300 mg by mouth 2 (two) times daily as needed for pain.   amiodarone 200 MG tablet Commonly known as: PACERONE Take 200 mg by mouth daily.   amoxicillin 500 MG capsule Commonly known as: AMOXIL Take 1 capsule (500 mg total) by mouth 2 (two) times daily.   apixaban 5 MG Tabs tablet Commonly known as: ELIQUIS Take 1 tablet by mouth 2 (two) times daily.   atorvastatin 40 MG tablet Commonly known as: LIPITOR Take 1 tablet (40 mg total) by mouth every evening.   blood glucose meter kit and supplies 1 each by Other route daily at 12 noon. Dispense based on patient and insurance preference. Check fsbs once a day   Calcium Carbonate-Vitamin D 600-400 MG-UNIT tablet Take 1 tablet by mouth 2 (two) times daily.   dapagliflozin propanediol 10 MG Tabs tablet Commonly known as: Farxiga Take 1 tablet (10 mg total) by mouth daily before breakfast. Patient receives via [employer] Patient Assistance   escitalopram 5 MG tablet Commonly known as: LEXAPRO TAKE 1 TABLET (5 MG TOTAL) BY MOUTH DAILY.   ferrous sulfate 325 (65 FE) MG EC tablet TAKE 1 TABLET BY MOUTH EVERY DAY   fluticasone 50 MCG/ACT nasal spray Commonly known as: FLONASE SPRAY 2 SPRAYS INTO EACH NOSTRIL EVERY DAY   hydroquinone 4 % cream Apply topically at bedtime.   latanoprost 0.005 % ophthalmic solution Commonly known as: XALATAN SMARTSIG:In Eye(s)   levothyroxine 50 MCG tablet Commonly known as: SYNTHROID Take 1 tablet (50 mcg total) by mouth daily. But take two tablets once a week   Magnesium Oxide -Mg Supplement 500 MG Caps Take 500 mg by mouth daily.   metoprolol succinate 50 MG 24  hr tablet Commonly known as: TOPROL-XL Take 50 mg by mouth daily.   omeprazole 40 MG capsule Commonly known as: PRILOSEC Take 1 capsule (40 mg total) by mouth daily.   oxybutynin 10 MG 24 hr tablet Commonly known as: DITROPAN-XL Take 1 tablet (10 mg total) by mouth daily.   oxyCODONE-acetaminophen 5-325 MG tablet Commonly known as: PERCOCET/ROXICET Take by mouth.   polyethylene glycol 17 g packet Commonly known as: MIRALAX / GLYCOLAX Take 17 g by mouth daily. What changed: when to take this   potassium chloride 10 MEQ tablet Commonly known as: KLOR-CON Take 10 mEq by mouth daily.   Simbrinza 1-0.2 % Susp Generic drug: Brinzolamide-Brimonidine Apply to eye.   sodium chloride 0.65 % Soln nasal spray Commonly known as: OCEAN Place 1 spray into both nostrils 3 (three) times daily.   torsemide 20 MG tablet Commonly known as: DEMADEX TAKE 1 TABLET BY MOUTH EVERY  DAY   traZODone 50 MG tablet Commonly known as: DESYREL TAKE 0.5-1 TABLETS (25-50 MG TOTAL) BY MOUTH AT BEDTIME AS NEEDED. FOR SLEEP        Allergies:  Allergies  Allergen Reactions   Lisinopril Swelling    Family History: Family History  Problem Relation Age of Onset   Diabetes Mother    Hyperlipidemia Mother    Hypertension Mother    Diabetes Father    Hyperlipidemia Father    Hypertension Father    Obesity Father    Hypertension Sister    Hyperlipidemia Sister    Hyperlipidemia Sister    Breast cancer Neg Hx     Social History:  reports that she has never smoked. She has never been exposed to tobacco smoke. She has never used smokeless tobacco. She reports that she does not drink alcohol and does not use drugs.  ROS: For pertinent review of systems please refer to history of present illness  Physical Exam: BP (!) 107/57   Pulse (!) 53   Ht 5\' 11"  (1.803 m)   Wt 251 lb (113.9 kg)   BMI 35.01 kg/m   Constitutional:  Well nourished. Alert and oriented, No acute distress. HEENT: Tensed AT,  moist mucus membranes.  Trachea midline Cardiovascular: No clubbing, cyanosis, or edema. Respiratory: Normal respiratory effort, no increased work of breathing. Neurologic: Grossly intact, no focal deficits, moving all 4 extremities.  In wheelchair.  Psychiatric: Normal mood and affect.    Laboratory Data: Component     Latest Ref Rng 07/02/2023  Hemoglobin A1C     4.0 - 5.6 % 5.7 !   HbA1c, POC (controlled diabetic range)     0.0 - 7.0 %     Legend: ! Abnormal  CBC    Component Value Date/Time   WBC 4.8 06/29/2023 0959   WBC 4.5 12/30/2022 0926   RBC 4.00 06/29/2023 0959   HGB 12.2 06/29/2023 0959   HGB 13.2 11/26/2015 1122   HCT 38.8 06/29/2023 0959   HCT 40.9 11/26/2015 1122   PLT 179 06/29/2023 0959   PLT 224 11/26/2015 1122   MCV 97.0 06/29/2023 0959   MCV 96 11/26/2015 1122   MCV 96 10/18/2012 1716   MCH 30.5 06/29/2023 0959   MCHC 31.4 06/29/2023 0959   RDW 16.9 (H) 06/29/2023 0959   RDW 15.1 11/26/2015 1122   RDW 15.2 (H) 10/18/2012 1716   LYMPHSABS 0.8 06/29/2023 0959   LYMPHSABS 2.3 11/26/2015 1122   MONOABS 0.4 06/29/2023 0959   EOSABS 0.1 06/29/2023 0959   EOSABS 0.3 11/26/2015 1122   BASOSABS 0.0 06/29/2023 0959   BASOSABS 0.0 11/26/2015 1122    CMP     Component Value Date/Time   NA 141 03/02/2023 1030   NA 142 11/26/2015 1122   NA 141 10/18/2012 1716   K 4.6 03/02/2023 1030   K 3.5 10/18/2012 1716   CL 104 03/02/2023 1030   CL 110 (H) 10/18/2012 1716   CO2 32 03/02/2023 1030   CO2 24 10/18/2012 1716   GLUCOSE 75 03/02/2023 1030   GLUCOSE 128 (H) 10/18/2012 1716   BUN 21 03/02/2023 1030   BUN 27 11/26/2015 1122   BUN 31 (H) 10/18/2012 1716   CREATININE 1.43 (H) 03/02/2023 1030   CALCIUM 9.1 03/02/2023 1030   CALCIUM 7.9 (L) 10/18/2012 1716   PROT 7.9 03/02/2023 1030   PROT 7.6 11/26/2015 1122   PROT 8.0 05/22/2012 2206   ALBUMIN 3.4 (L) 11/07/2022 0955   ALBUMIN  4.3 11/26/2015 1122   ALBUMIN 3.4 05/22/2012 2206   AST 20 03/02/2023  1030   AST 34 05/22/2012 2206   ALT 13 03/02/2023 1030   ALT 31 05/22/2012 2206   ALKPHOS 94 11/07/2022 0955   ALKPHOS 115 05/22/2012 2206   BILITOT 0.5 03/02/2023 1030   BILITOT 0.3 11/26/2015 1122   BILITOT 0.3 05/22/2012 2206   EGFR 38 (L) 03/02/2023 1030   GFRNONAA 37 (L) 11/07/2022 0955   GFRNONAA 41 (L) 02/04/2021 1123  I have reviewed the labs.  Pertinent Imaging   07/10/23 10:57  Scan Result 15ml    Assessment & Plan:    1. Mixed incontinence -At goal on oxybutynin XL 10 mg daily -Continue the medication refill sent to pharmacy  2. Nocturia -at goal with oxybutynin XL 10 mg daily  Return for one year for oab, pvr.  These notes generated with voice recognition software. I apologize for typographical errors.  Cloretta Ned  [facility] [address] [place], [place] [address] [phone]

## 2023-07-10 ENCOUNTER — Ambulatory Visit: Payer: Medicare Other | Admitting: Urology

## 2023-07-10 ENCOUNTER — Encounter: Payer: Self-pay | Admitting: Urology

## 2023-07-10 VITALS — BP 107/57 | HR 53 | Ht 71.0 in | Wt 251.0 lb

## 2023-07-10 DIAGNOSIS — R351 Nocturia: Secondary | ICD-10-CM | POA: Diagnosis not present

## 2023-07-10 DIAGNOSIS — N3946 Mixed incontinence: Secondary | ICD-10-CM | POA: Diagnosis not present

## 2023-07-10 LAB — BLADDER SCAN AMB NON-IMAGING

## 2023-07-10 MED ORDER — OXYBUTYNIN CHLORIDE ER 10 MG PO TB24
10.0000 mg | ORAL_TABLET | Freq: Every day | ORAL | 3 refills | Status: DC
Start: 2023-07-10 — End: 2024-07-28

## 2023-07-20 NOTE — Telephone Encounter (Signed)
Carina calling from [facility] is calling to request office visit notes with in the last year since a new CPAP has been ordered. CB- [phone] [phone]

## 2023-07-21 NOTE — Telephone Encounter (Signed)
OV notes printed and faxed

## 2023-07-29 ENCOUNTER — Encounter: Payer: Self-pay | Admitting: Pharmacist

## 2023-07-29 ENCOUNTER — Other Ambulatory Visit: Payer: Self-pay | Admitting: Pharmacist

## 2023-07-29 DIAGNOSIS — E1142 Type 2 diabetes mellitus with diabetic polyneuropathy: Secondary | ICD-10-CM | POA: Diagnosis not present

## 2023-07-29 DIAGNOSIS — L851 Acquired keratosis [keratoderma] palmaris et plantaris: Secondary | ICD-10-CM | POA: Diagnosis not present

## 2023-07-29 DIAGNOSIS — B351 Tinea unguium: Secondary | ICD-10-CM | POA: Diagnosis not present

## 2023-07-29 NOTE — Progress Notes (Unsigned)
   07/29/2023  Patient ID: Becky Trevino, female   DOB: 23-Aug-1946, 78 y.o.   MRN: [id number]  From review of chart, note patient enrolled in patient assistance program from [employer] for Farxiga for 2024 calendar year.   Reach patient by telephone today. Reports that she completed re-enrollment via [employer] Digital [employer] and was notified that she was pre-approved for re-enrollment for 2025 - Note program will still need updated prescription from provider   Confirms has contact number for [employer] patient assistance program to contact if/as needed for refills   Follow Up Plan:    Will collaborate with PCP to request provider e-prescribe renewal of Farxiga prescription to [facility] (dispensing pharmacy for [employer] program) for patient.   Patient denies further medication questions or concerns today   Clinical Pharmacist will follow up with patient by telephone on 08/19/2023 at 2:30 PM to complete medication review and follow up regarding medication assistance   Estelle Grumbles, PharmD, [facility] [phone]

## 2023-07-30 NOTE — Patient Instructions (Addendum)
Goals Addressed             This Visit's Progress    Pharmacy Goals       If you need to reach out to [employer] patient assistance program regarding refills or questions about re-enrollment, you can do so by calling [phone].   I look forward to speaking with you again on 08/19/2023 at 2:30 PM    Please feel free to reach out to me sooner for any medication related questions or concerns.    Thank you!   Estelle Grumbles, PharmD, [facility] [phone]

## 2023-07-31 ENCOUNTER — Ambulatory Visit (INDEPENDENT_AMBULATORY_CARE_PROVIDER_SITE_OTHER): Payer: Medicare Other | Admitting: Nurse Practitioner

## 2023-07-31 ENCOUNTER — Encounter (INDEPENDENT_AMBULATORY_CARE_PROVIDER_SITE_OTHER): Payer: Self-pay | Admitting: Nurse Practitioner

## 2023-07-31 VITALS — BP 123/65 | HR 57 | Resp 16

## 2023-07-31 DIAGNOSIS — I890 Lymphedema, not elsewhere classified: Secondary | ICD-10-CM | POA: Diagnosis not present

## 2023-07-31 DIAGNOSIS — N183 Chronic kidney disease, stage 3 unspecified: Secondary | ICD-10-CM | POA: Diagnosis not present

## 2023-07-31 DIAGNOSIS — E1122 Type 2 diabetes mellitus with diabetic chronic kidney disease: Secondary | ICD-10-CM | POA: Diagnosis not present

## 2023-07-31 DIAGNOSIS — I10 Essential (primary) hypertension: Secondary | ICD-10-CM

## 2023-08-02 ENCOUNTER — Other Ambulatory Visit: Payer: Self-pay | Admitting: Family Medicine

## 2023-08-02 DIAGNOSIS — G4709 Other insomnia: Secondary | ICD-10-CM

## 2023-08-04 ENCOUNTER — Other Ambulatory Visit: Payer: Self-pay | Admitting: Family Medicine

## 2023-08-04 DIAGNOSIS — Z1231 Encounter for screening mammogram for malignant neoplasm of breast: Secondary | ICD-10-CM

## 2023-08-06 ENCOUNTER — Telehealth: Payer: Self-pay | Admitting: *Deleted

## 2023-08-06 ENCOUNTER — Ambulatory Visit (INDEPENDENT_AMBULATORY_CARE_PROVIDER_SITE_OTHER): Payer: Medicare Other

## 2023-08-06 DIAGNOSIS — Z599 Problem related to housing and economic circumstances, unspecified: Secondary | ICD-10-CM | POA: Diagnosis not present

## 2023-08-06 DIAGNOSIS — Z0000 Encounter for general adult medical examination without abnormal findings: Secondary | ICD-10-CM

## 2023-08-06 NOTE — Progress Notes (Signed)
Subjective:   Becky Trevino is a 77 y.o. female who presents for Medicare Annual (Subsequent) preventive examination.  Visit Complete: Virtual I connected with  Mikel Cella on 08/06/23 by a audio enabled telemedicine application and verified that I am speaking with the correct person using two identifiers.  Patient Location: Home  Provider Location: Office/Clinic  I discussed the limitations of evaluation and management by telemedicine. The patient expressed understanding and agreed to proceed.  Vital Signs: Because this visit was a virtual/telehealth visit, some criteria may be missing or patient reported. Any vitals not documented were not able to be obtained and vitals that have been documented are patient reported.    Cardiac Risk Factors include: advanced age (>14men, >35 women);diabetes mellitus;dyslipidemia;hypertension;sedentary lifestyle;obesity (BMI >30kg/m2)     Objective:    Today's Vitals   08/06/23 0950  PainSc: 0-No pain   There is no height or weight on file to calculate BMI.     08/06/2023    9:56 AM 07/01/2023    9:51 AM 05/13/2023   10:42 AM 12/31/2022    1:22 PM 08/26/2022   10:15 AM 08/05/2022   10:36 AM 09/14/2021    5:25 PM  Advanced Directives  Does Patient Have a Medical Advance Directive? No Yes Yes No Yes Yes No  Type of Special educational needs teacher of [place];Living will Healthcare Power of Attorney Living will;Healthcare Power of [employer] Power of [employer] Power of [place];Living will   Does patient want to make changes to medical advance directive?      No - Patient declined   Copy of Healthcare Power of Attorney in Chart?  No - copy requested No - copy requested No - copy requested No - copy requested No - copy requested   Would patient like information on creating a medical advance directive? No - Patient declined   No - Patient declined   No - Patient declined    Current Medications  (verified) Outpatient Encounter Medications as of 08/06/2023  Medication Sig   ACCU-CHEK GUIDE test strip TEST ONCE A DAY AS DIRECTED   acetaminophen (TYLENOL) 650 MG CR tablet Take 650-1,300 mg by mouth 2 (two) times daily as needed for pain.   amiodarone (PACERONE) 200 MG tablet Take 200 mg by mouth daily.   amoxicillin (AMOXIL) 500 MG capsule Take 1 capsule (500 mg total) by mouth 2 (two) times daily.   apixaban (ELIQUIS) 5 MG TABS tablet Take 1 tablet by mouth 2 (two) times daily.   atorvastatin (LIPITOR) 40 MG tablet Take 1 tablet (40 mg total) by mouth every evening.   blood glucose meter kit and supplies 1 each by Other route daily at 12 noon. Dispense based on patient and insurance preference. Check fsbs once a day   Calcium Carbonate-Vitamin D 600-400 MG-UNIT tablet Take 1 tablet by mouth 2 (two) times daily.   dapagliflozin propanediol (FARXIGA) 10 MG TABS tablet Take 1 tablet (10 mg total) by mouth daily before breakfast. Patient receives via [employer] Patient Assistance   escitalopram (LEXAPRO) 5 MG tablet TAKE 1 TABLET (5 MG TOTAL) BY MOUTH DAILY.   ferrous sulfate 325 (65 FE) MG EC tablet TAKE 1 TABLET BY MOUTH EVERY DAY   fluticasone (FLONASE) 50 MCG/ACT nasal spray SPRAY 2 SPRAYS INTO EACH NOSTRIL EVERY DAY   hydroquinone 4 % cream Apply topically at bedtime.   latanoprost (XALATAN) 0.005 % ophthalmic solution SMARTSIG:In Eye(s)   levothyroxine (SYNTHROID) 50 MCG tablet Take 1 tablet (50  mcg total) by mouth daily. But take two tablets once a week   Magnesium Oxide 500 MG CAPS Take 500 mg by mouth daily.    metoprolol succinate (TOPROL-XL) 50 MG 24 hr tablet Take 50 mg by mouth daily.   omeprazole (PRILOSEC) 40 MG capsule Take 1 capsule (40 mg total) by mouth daily.   oxybutynin (DITROPAN-XL) 10 MG 24 hr tablet Take 1 tablet (10 mg total) by mouth daily.   oxyCODONE-acetaminophen (PERCOCET/ROXICET) 5-325 MG tablet Take by mouth.   polyethylene glycol (MIRALAX / GLYCOLAX) 17 g packet  Take 17 g by mouth daily. (Patient taking differently: Take 17 g by mouth every other day.)   potassium chloride (KLOR-CON) 10 MEQ tablet Take 10 mEq by mouth daily.   SIMBRINZA 1-0.2 % SUSP Apply to eye.   sodium chloride (OCEAN) 0.65 % SOLN nasal spray Place 1 spray into both nostrils 3 (three) times daily.   torsemide (DEMADEX) 20 MG tablet TAKE 1 TABLET BY MOUTH EVERY DAY   traZODone (DESYREL) 50 MG tablet TAKE 1/2 TO 1 TABLET (25-50 MG TOTAL) BY MOUTH AT BEDTIME AS NEEDED. FOR SLEEP   No facility-administered encounter medications on file as of 08/06/2023.    Allergies (verified) Lisinopril   History: Past Medical History:  Diagnosis Date   Cervical spondylosis    Chronic kidney disease    Stage 3   DDD (degenerative disc disease), cervical    [facility]   Diabetes mellitus without complication (HCC)    Diverticulosis    Hyperlipidemia    Hypertension    Intertrigo    Leg cramps    Leg varices    Obesity    OSA (obstructive sleep apnea)    Symptomatic menopausal or female climacteric states    Syncope    Past Surgical History:  Procedure Laterality Date   ABDOMINAL HYSTERECTOMY  1993   Total   BUNIONECTOMY Bilateral 1993   ESOPHAGOGASTRODUODENOSCOPY (EGD) WITH PROPOFOL N/A 05/13/2023   Procedure: ESOPHAGOGASTRODUODENOSCOPY (EGD) WITH PROPOFOL;  Surgeon: [name], MD;  Location: [facility] ENDOSCOPY;  Service: Gastroenterology;  Laterality: N/A;   I & D KNEE WITH POLY EXCHANGE Right 11/19/2017   Procedure: RIGHT KNEE POLY EXCHANGE WITH IRRIGATION AND DEBRIDEMENT;  Surgeon: Kennedy Bucker, MD;  Location: [facility] ORS;  Service: Orthopedics;  Laterality: Right;   IR FLUORO GUIDED NEEDLE PLC ASPIRATION/INJECTION LOC  09/19/2021   JOINT REPLACEMENT     bilateral knee   LAMINECTOMY  11/14/2013    Cervical Fusion , [facility], Dr. Timoteo Ace   LASER ABLATION Bilateral 07/29/2012   Dr. Wyn Quaker   LOWER EXTREMITY ANGIOGRAPHY Right 11/02/2019   Procedure: Lower Extremity Angiography;  Surgeon:  Renford Dills, MD;  Location: [facility] INVASIVE CV LAB;  Service: Cardiovascular;  Laterality: Right;   ORIF ANKLE FRACTURE Right 09/02/2019   Procedure: OPEN REDUCTION INTERNAL FIXATION (ORIF) ANKLE FRACTURE BIMALLEOLAR;  Surgeon: Rosetta Posner, DPM;  Location: [facility] ORS;  Service: Podiatry;  Laterality: Right;   PERIPHERAL VASCULAR THROMBECTOMY Right 03/02/2020   Procedure: PERIPHERAL VASCULAR THROMBECTOMY;  Surgeon: Annice Needy, MD;  Location: [facility] CV LAB;  Service: Cardiovascular;  Laterality: Right;   REPLACEMENT TOTAL KNEE Left 03/2010   Dr. Rosita Kea   TEE WITHOUT CARDIOVERSION N/A 09/19/2021   Procedure: TRANSESOPHAGEAL ECHOCARDIOGRAM (TEE);  Surgeon: Lamar Blinks, MD;  Location: [facility] ORS;  Service: Cardiovascular;  Laterality: N/A;   TOTAL HIP ARTHROPLASTY Left 12/11/2015   Procedure: TOTAL HIP ARTHROPLASTY ANTERIOR APPROACH;  Surgeon: Kennedy Bucker, MD;  Location: [facility] OR[facility];  Service: Orthopedics;  Laterality: Left;   TOTAL KNEE ARTHROPLASTY Right 03/11/2016   Procedure: TOTAL KNEE ARTHROPLASTY;  Surgeon: Kennedy Bucker, MD;  Location: [facility] ORS;  Service: Orthopedics;  Laterality: Right;   Family History  Problem Relation Age of Onset   Diabetes Mother    Hyperlipidemia Mother    Hypertension Mother    Diabetes Father    Hyperlipidemia Father    Hypertension Father    Obesity Father    Hypertension Sister    Hyperlipidemia Sister    Hyperlipidemia Sister    Breast cancer Neg Hx    Social History   Socioeconomic History   Marital status: Married    Spouse name: Kamaya Carleo   Number of children: 1   Years of education: Not on file   Highest education level: Not on file  Occupational History   Occupation: Retired  Tobacco Use   Smoking status: Never    Passive exposure: Never   Smokeless tobacco: Never  Vaping Use   Vaping status: Never Used  Substance and Sexual Activity   Alcohol use: No    Alcohol/week: 0.0 standard drinks of alcohol   Drug use: No    Sexual activity: Yes    Partners: Male  Other Topics Concern   Not on file  Social History Narrative   Married, one adopted grown child    Social Determinants of Health   Financial Resource Strain: Low Risk  (08/06/2023)   Overall Financial Resource Strain (CARDIA)    Difficulty of Paying Living Expenses: Not very hard  Food Insecurity: No Food Insecurity (08/06/2023)   Hunger Vital Sign    Worried About Running Out of Food in the Last Year: Never true    Ran Out of Food in the Last Year: Never true  Transportation Needs: No Transportation Needs (08/06/2023)   PRAPARE - Administrator, Civil Service (Medical): No    Lack of Transportation (Non-Medical): No  Physical Activity: Insufficiently Active (08/06/2023)   Exercise Vital Sign    Days of Exercise per Week: 5 days    Minutes of Exercise per Session: 20 min  Stress: No Stress Concern Present (08/06/2023)   [employer] of Occupational Health - Occupational Stress Questionnaire    Feeling of Stress : Not at all  Social Connections: Moderately Integrated (08/06/2023)   Social Connection and Isolation Panel [NHANES]    Frequency of Communication with Friends and Family: More than three times a week    Frequency of Social Gatherings with Friends and Family: Once a week    Attends Religious Services: More than 4 times per year    Active Member of [employer] or Organizations: No    Attends Engineer, structural: Never    Marital Status: Married    Tobacco Counseling Counseling given: Not Answered   Clinical Intake:  Pre-visit preparation completed: Yes  Pain : No/denies pain Pain Score: 0-No pain     BMI - recorded: 35 Nutritional Status: BMI > 30  Obese Nutritional Risks: None Diabetes: No  How often do you need to have someone help you when you read instructions, pamphlets, or other written materials from your doctor or pharmacy?: 1 - Never  Interpreter Needed?: No  Information entered by  :: Kennedy Bucker, LPN   Activities of Daily Living    08/06/2023    9:56 AM 07/02/2023    9:43 AM  In your present state of health, do you have any difficulty performing the following activities:  Hearing? 0 0  Vision? 0 0  Difficulty concentrating or making decisions? 0 0  Walking or climbing stairs? 1 1  Dressing or bathing? 1 1  Doing errands, shopping? 1 1  Preparing Food and eating ? N   Using the Toilet? N   In the past six months, have you accidently leaked urine? N   Do you have problems with loss of bowel control? N   Managing your Medications? N   Managing your Finances? N   Housekeeping or managing your Housekeeping? Y     Patient Care Team: Alba Cory, MD as PCP - General (Family Medicine) Alwyn Pea, MD as Consulting Physician (Cardiology) Irene Limbo., MD as Consulting Physician (Ophthalmology) Kennedy Bucker, MD as Consulting Physician (Orthopedic Surgery) Rosetta Posner, DPM as Consulting Physician (Podiatry) [name], MD as Consulting Physician (Oncology) Wyn Quaker Marlow Baars, MD as Referring Physician (Vascular Surgery) Ronney Asters Jackelyn Poling, RPH-[name] as Pharmacist  Indicate any recent Medical Services you may have received from other than [facility] providers in the past year (date may be approximate).     Assessment:   This is a routine wellness examination for [place].  Hearing/Vision screen Hearing Screening - Comments:: No aids Vision Screening - Comments:: Readers- Dr.Bell   Goals Addressed             This Visit's Progress    DIET - EAT MORE FRUITS AND VEGETABLES         Depression Screen    08/06/2023    9:53 AM 07/02/2023    9:43 AM 06/25/2023   11:01 AM 04/15/2023   10:57 AM 03/02/2023    9:48 AM 12/01/2022   11:42 AM 08/29/2022    2:54 PM  PHQ 2/9 Scores  PHQ - 2 Score 0 0 0 0 0 0 0  PHQ- 9 Score 0 0  0 0 0 0    Fall Risk    08/06/2023    9:56 AM 07/02/2023    9:43 AM 06/25/2023   11:01 AM 04/15/2023   10:57 AM 03/02/2023     9:48 AM  Fall Risk   Falls in the past year? 0 0 0 0 0  Number falls in past yr: 0    0  Injury with Fall? 0    0  Risk for fall due to : No Fall Risks Impaired mobility;Impaired balance/gait;Orthopedic patient No Fall Risks Impaired mobility Impaired mobility;Impaired balance/gait  Follow up Falls prevention discussed;Falls evaluation completed Falls prevention discussed;Education provided;Falls evaluation completed Falls evaluation completed Falls prevention discussed;Education provided;Falls evaluation completed Falls prevention discussed    MEDICARE RISK AT HOME: Medicare Risk at Home Any stairs in or around the home?: No If so, are there any without handrails?: No Home free of loose throw rugs in walkways, pet beds, electrical cords, etc?: Yes Adequate lighting in your home to reduce risk of falls?: Yes Life alert?: No Use of a cane, walker or w/c?: Yes (w/c at all times, or uses walker) Grab bars in the bathroom?: No Shower chair or bench in shower?: Yes Elevated toilet seat or a handicapped toilet?: No  TIMED UP AND GO:  Was the test performed?  No    Cognitive Function:        08/06/2023    9:58 AM 08/05/2022   10:27 AM 07/31/2020    3:12 PM  6CIT Screen  What Year? 0 points 0 points 0 points  What month? 0 points 0 points 0 points  What time? 0 points  0 points 0 points  Count back from 20 0 points 2 points 0 points  Months in reverse 0 points 0 points 0 points  Repeat phrase 2 points 2 points 0 points  Total Score 2 points 4 points 0 points    Immunizations Immunization History  Administered Date(s) Administered   Fluad Quad(high Dose 65+) 05/12/2019, 05/29/2020, 06/07/2021, 05/30/2022   Fluad Trivalent(High Dose 65+) 06/25/2023   Influenza, High Dose Seasonal PF 05/22/2016, 06/09/2017, 05/21/2018   Influenza,inj,Quad PF,6+ Mos 04/26/2015   Influenza-Unspecified 04/15/2014   Moderna Covid-19 Fall Seasonal Vaccine 38yrs & older 07/06/2023   Moderna  Covid-19 Vaccine Bivalent Booster 28yrs & up 07/29/2021   Moderna Sars-Covid-2 Vaccination 10/24/2019, 11/21/2019, 08/15/2020, 01/14/2021   Pfizer(Comirnaty)Fall Seasonal Vaccine 12 years and older 07/07/2022   Pneumococcal Conjugate-13 07/09/2006, 01/25/2015   Pneumococcal Polysaccharide-23 03/11/2012   RSV,unspecified 08/14/2022   Tdap 06/03/2006, 11/06/2016   Zoster Recombinant(Shingrix) 11/14/2020, 06/07/2021   Zoster, Live 06/19/2010    TDAP status: Up to date  Flu Vaccine status: Up to date  Pneumococcal vaccine status: Declined,  Education has been provided regarding the importance of this vaccine but patient still declined. Advised may receive this vaccine at local pharmacy or Health Dept. Aware to provide a copy of the vaccination record if obtained from local pharmacy or Health Dept. Verbalized acceptance and understanding.   Covid-19 vaccine status: Completed vaccines  Qualifies for Shingles Vaccine? Yes   Zostavax completed Yes   Shingrix Completed?: Yes  Screening Tests Health Maintenance  Topic Date Due   OPHTHALMOLOGY EXAM  05/21/2023   HEMOGLOBIN A1C  12/31/2023   Diabetic kidney evaluation - eGFR measurement  03/01/2024   FOOT EXAM  03/01/2024   Diabetic kidney evaluation - Urine ACR  03/02/2024   Medicare Annual Wellness (AWV)  08/05/2024   DTaP/Tdap/Td (3 - Td or Tdap) 11/06/2026   Pneumonia Vaccine 29+ Years old  Completed   INFLUENZA VACCINE  Completed   DEXA SCAN  Completed   COVID-19 Vaccine  Completed   Hepatitis C Screening  Completed   Zoster Vaccines- Shingrix  Completed   HPV VACCINES  Aged Out   Colonoscopy  Discontinued   Fecal DNA (Cologuard)  Discontinued    Health Maintenance  Health Maintenance Due  Topic Date Due   OPHTHALMOLOGY EXAM  05/21/2023    Colorectal cancer screening: No longer required.   Mammogram status: No longer required due to age.- SCHEDULED FOR ONE 08/27/23  Bone Density status: Completed 10/15/21. Results  reflect: Bone density results: NORMAL. Repeat every 5 years.  Lung Cancer Screening: (Low Dose CT Chest recommended if Age 11-80 years, 20 pack-year currently smoking OR have quit w/in 15years.) does not qualify.    Additional Screening:  Hepatitis C Screening: does qualify; Completed 07/22/12  Vision Screening: Recommended annual ophthalmology exams for early detection of glaucoma and other disorders of the eye. Is the patient up to date with their annual eye exam?  Yes  Who is the provider or what is the name of the office in which the patient attends annual eye exams? Dr.Bell If pt is not established with a provider, would they like to be referred to a provider to establish care? No .   Dental Screening: Recommended annual dental exams for proper oral hygiene  Diabetic Foot Exam: Diabetic Foot Exam: Completed 03/02/23  Community Resource Referral / Chronic Care Management: CRR required this visit?  YES  CCM required this visit?  No     Plan:  I have personally reviewed and noted the following in the patient's chart:   Medical and social history Use of alcohol, tobacco or illicit drugs  Current medications and supplements including opioid prescriptions. Patient is not currently taking opioid prescriptions. Functional ability and status Nutritional status Physical activity Advanced directives List of other physicians Hospitalizations, surgeries, and ER visits in previous 12 months Vitals Screenings to include cognitive, depression, and falls Referrals and appointments  In addition, I have reviewed and discussed with patient certain preventive protocols, quality metrics, and best practice recommendations. A written personalized care plan for preventive services as well as general preventive health recommendations were provided to patient.     Hal Hope, LPN   [id number]/[id number]   After Visit Summary: (MyChart) Due to this being a telephonic visit, the after visit  summary with patients personalized plan was offered to patient via MyChart   Nurse Notes: none

## 2023-08-06 NOTE — Progress Notes (Signed)
  Care Coordination   Note   08/06/2023 Name: Becky Trevino MRN: [id number] DOB: Jul 19, 1946  Becky Trevino is a 77 y.o. year old female who sees Alba Cory, MD for primary care. I reached out to Mikel Cella by phone today to offer care coordination services.  [name] was given information about Care Coordination services today including:   The Care Coordination services include support from the care team which includes your Nurse Coordinator, Clinical Social Worker, or Pharmacist.  The Care Coordination team is here to help remove barriers to the health concerns and goals most important to you. Care Coordination services are voluntary, and the patient may decline or stop services at any time by request to their care team member.   Care Coordination Consent Status: Patient agreed to services and verbal consent obtained.   Follow up plan:  Telephone appointment with care coordination team member scheduled for:  08/17/2023  Encounter Outcome:  Patient Scheduled from referral   Burman Nieves, [facility] Care Coordination Care Guide Direct Dial: [phone]

## 2023-08-06 NOTE — Patient Instructions (Addendum)
Becky Trevino , Thank you for taking time to come for your Medicare Wellness Visit. I appreciate your ongoing commitment to your health goals. Please review the following plan we discussed and let me know if I can assist you in the future.   Referrals/Orders/Follow-Ups/Clinician Recommendations: resource referral made   This is a list of the screening recommended for you and due dates:  Health Maintenance  Topic Date Due   Eye exam for diabetics  05/21/2023   Hemoglobin A1C  12/31/2023   Yearly kidney function blood test for diabetes  03/01/2024   Complete foot exam   03/01/2024   Yearly kidney health urinalysis for diabetes  03/02/2024   Medicare Annual Wellness Visit  08/05/2024   DTaP/Tdap/Td vaccine (3 - Td or Tdap) 11/06/2026   Pneumonia Vaccine  Completed   Flu Shot  Completed   DEXA scan (bone density measurement)  Completed   COVID-19 Vaccine  Completed   Hepatitis C Screening  Completed   Zoster (Shingles) Vaccine  Completed   HPV Vaccine  Aged Out   Colon Cancer Screening  Discontinued   Cologuard (Stool DNA test)  Discontinued    Advanced directives: (ACP Link)Information on Advanced Care Planning can be found at [facility] of [place] Advance Health Care Directives Advance Health Care Directives ([email or web address]   Next Medicare Annual Wellness Visit scheduled for next year: Yes    08/25/24 @ 10:10 in person

## 2023-08-06 NOTE — Progress Notes (Signed)
  Care Coordination  Outreach Note  08/06/2023 Name: TYSHEMA KULT MRN: [id number] DOB: 02-20-1946   Care Coordination Outreach Attempts: An unsuccessful telephone outreach was attempted today to offer the patient information about available care coordination services.  Follow Up Plan:  Additional outreach attempts will be made to offer the patient care coordination information and services.   Encounter Outcome:  No Answer  Burman Nieves, CCMA Care Coordination Care Guide Direct Dial: [phone]

## 2023-08-10 ENCOUNTER — Encounter (INDEPENDENT_AMBULATORY_CARE_PROVIDER_SITE_OTHER): Payer: Self-pay | Admitting: Nurse Practitioner

## 2023-08-10 NOTE — Progress Notes (Signed)
Subjective:    Patient ID: Becky Trevino, female    DOB: 07-31-1946, 77 y.o.   MRN: [id number] Chief Complaint  Patient presents with   Follow-up    6 month follow up     Becky Trevino is a 77 year old woman that returns today for follow-up evaluation of lower extremity edema.  The patient had been doing physical therapy but had not been walking for many months.  The patient notes that her leg swelling has improved but she still has significant difficulty with swelling in her medial thighs as well as truncal edema.  She is very diligent about wearing her compression wraps using a Velcro compression system.  This does well for the swelling below her knees.  However she has developed swelling in the medial thighs bilaterally.  The left tends to be worse than the right.  Patient denies any open wounds or ulcerations.  She denies any fevers or chills.    Review of Systems  Cardiovascular:  Positive for leg swelling.  Musculoskeletal:  Positive for gait problem.  All other systems reviewed and are negative.      Objective:   Physical Exam Vitals reviewed.  HENT:     Head: Normocephalic.  Cardiovascular:     Rate and Rhythm: Normal rate.  Pulmonary:     Effort: Pulmonary effort is normal.  Musculoskeletal:     Right lower leg: 2+ Edema present.     Left lower leg: 2+ Edema present.  Skin:    General: Skin is warm and dry.     Comments: Dermal thickening bilaterally  Neurological:     Mental Status: She is alert and oriented to person, place, and time.     Motor: Weakness present.  Psychiatric:        Mood and Affect: Mood normal.        Behavior: Behavior normal.        Thought Content: Thought content normal.        Judgment: Judgment normal.     BP 123/65 (BP Location: Right Arm)   Pulse (!) 57   Resp 16   Past Medical History:  Diagnosis Date   Cervical spondylosis    Chronic kidney disease    Stage 3   DDD (degenerative disc disease), cervical    [facility]  Neurosurgery   Diabetes mellitus without complication (HCC)    Diverticulosis    Hyperlipidemia    Hypertension    Intertrigo    Leg cramps    Leg varices    Obesity    OSA (obstructive sleep apnea)    Symptomatic menopausal or female climacteric states    Syncope     Social History   Socioeconomic History   Marital status: Married    Spouse name: Gennell Franca   Number of children: 1   Years of education: Not on file   Highest education level: Not on file  Occupational History   Occupation: Retired  Tobacco Use   Smoking status: Never    Passive exposure: Never   Smokeless tobacco: Never  Vaping Use   Vaping status: Never Used  Substance and Sexual Activity   Alcohol use: No    Alcohol/week: 0.0 standard drinks of alcohol   Drug use: No   Sexual activity: Yes    Partners: Male  Other Topics Concern   Not on file  Social History Narrative   Married, one adopted grown child    Social Determinants of Corporate investment banker  Strain: Low Risk  (08/06/2023)   Overall Financial Resource Strain (CARDIA)    Difficulty of Paying Living Expenses: Not very hard  Food Insecurity: No Food Insecurity (08/06/2023)   Hunger Vital Sign    Worried About Running Out of Food in the Last Year: Never true    Ran Out of Food in the Last Year: Never true  Transportation Needs: No Transportation Needs (08/06/2023)   PRAPARE - Administrator, Civil Service (Medical): No    Lack of Transportation (Non-Medical): No  Physical Activity: Insufficiently Active (08/06/2023)   Exercise Vital Sign    Days of Exercise per Week: 5 days    Minutes of Exercise per Session: 20 min  Stress: No Stress Concern Present (08/06/2023)   [employer] of Occupational Health - Occupational Stress Questionnaire    Feeling of Stress : Not at all  Social Connections: Moderately Integrated (08/06/2023)   Social Connection and Isolation Panel [NHANES]    Frequency of Communication with  Friends and Family: More than three times a week    Frequency of Social Gatherings with Friends and Family: Once a week    Attends Religious Services: More than 4 times per year    Active Member of [employer] or Organizations: No    Attends Banker Meetings: Never    Marital Status: Married  Catering manager Violence: Not At Risk (08/06/2023)   Humiliation, Afraid, Rape, and Kick questionnaire    Fear of Current or Ex-Partner: No    Emotionally Abused: No    Physically Abused: No    Sexually Abused: No    Past Surgical History:  Procedure Laterality Date   ABDOMINAL HYSTERECTOMY  1993   Total   BUNIONECTOMY Bilateral 1993   ESOPHAGOGASTRODUODENOSCOPY (EGD) WITH PROPOFOL N/A 05/13/2023   Procedure: ESOPHAGOGASTRODUODENOSCOPY (EGD) WITH PROPOFOL;  Surgeon: [name], MD;  Location: [facility] ENDOSCOPY;  Service: Gastroenterology;  Laterality: N/A;   I & D KNEE WITH POLY EXCHANGE Right 11/19/2017   Procedure: RIGHT KNEE POLY EXCHANGE WITH IRRIGATION AND DEBRIDEMENT;  Surgeon: Kennedy Bucker, MD;  Location: [facility] ORS;  Service: Orthopedics;  Laterality: Right;   IR FLUORO GUIDED NEEDLE PLC ASPIRATION/INJECTION LOC  09/19/2021   JOINT REPLACEMENT     bilateral knee   LAMINECTOMY  11/14/2013    Cervical Fusion , [facility], Dr. Timoteo Ace   LASER ABLATION Bilateral 07/29/2012   Dr. Wyn Quaker   LOWER EXTREMITY ANGIOGRAPHY Right 11/02/2019   Procedure: Lower Extremity Angiography;  Surgeon: Renford Dills, MD;  Location: [facility] INVASIVE [facility] LAB;  Service: Cardiovascular;  Laterality: Right;   ORIF ANKLE FRACTURE Right 09/02/2019   Procedure: OPEN REDUCTION INTERNAL FIXATION (ORIF) ANKLE FRACTURE BIMALLEOLAR;  Surgeon: Rosetta Posner, DPM;  Location: [facility] OR[facility];  Service: Podiatry;  Laterality: Right;   PERIPHERAL VASCULAR THROMBECTOMY Right 03/02/2020   Procedure: PERIPHERAL VASCULAR THROMBECTOMY;  Surgeon: Annice Needy, MD;  Location: [facility];  Service: Cardiovascular;  Laterality: Right;    REPLACEMENT TOTAL KNEE Left 03/2010   Dr. Rosita Kea   TEE WITHOUT CARDIOVERSION N/A 09/19/2021   Procedure: TRANSESOPHAGEAL ECHOCARDIOGRAM (TEE);  Surgeon: Lamar Blinks, MD;  Location: [facility] OR[facility];  Service: Cardiovascular;  Laterality: N/A;   TOTAL HIP ARTHROPLASTY Left 12/11/2015   Procedure: TOTAL HIP ARTHROPLASTY ANTERIOR APPROACH;  Surgeon: Kennedy Bucker, MD;  Location: [facility] OR[facility];  Service: Orthopedics;  Laterality: Left;   TOTAL KNEE ARTHROPLASTY Right 03/11/2016   Procedure: TOTAL KNEE ARTHROPLASTY;  Surgeon: Kennedy Bucker, MD;  Location: [facility] ORS;  Service: Orthopedics;  Laterality: Right;    Family History  Problem Relation Age of Onset   Diabetes Mother    Hyperlipidemia Mother    Hypertension Mother    Diabetes Father    Hyperlipidemia Father    Hypertension Father    Obesity Father    Hypertension Sister    Hyperlipidemia Sister    Hyperlipidemia Sister    Breast cancer Neg Hx     Allergies  Allergen Reactions   Lisinopril Swelling       Latest Ref Rng & Units 06/29/2023    9:59 AM 12/30/2022    9:26 AM 11/07/2022    9:55 AM  CBC  WBC 4.0 - 10.5 K/uL 4.8  4.5  4.5   Hemoglobin 12.0 - 15.0 g/dL 16.1  09.6  04.5   Hematocrit 36.0 - 46.0 % 38.8  40.2  42.1   Platelets 150 - 400 K/uL 179  190  191       CMP     Component Value Date/Time   NA 141 03/02/2023 1030   NA 142 11/26/2015 1122   NA 141 10/18/2012 1716   K 4.6 03/02/2023 1030   K 3.5 10/18/2012 1716   CL 104 03/02/2023 1030   CL 110 (H) 10/18/2012 1716   CO2 32 03/02/2023 1030   CO2 24 10/18/2012 1716   GLUCOSE 75 03/02/2023 1030   GLUCOSE 128 (H) 10/18/2012 1716   BUN 21 03/02/2023 1030   BUN 27 11/26/2015 1122   BUN 31 (H) 10/18/2012 1716   CREATININE 1.43 (H) 03/02/2023 1030   CALCIUM 9.1 03/02/2023 1030   CALCIUM 7.9 (L) 10/18/2012 1716   PROT 7.9 03/02/2023 1030   PROT 7.6 11/26/2015 1122   PROT 8.0 05/22/2012 2206   ALBUMIN 3.4 (L) 11/07/2022 0955   ALBUMIN 4.3 11/26/2015 1122   ALBUMIN  3.4 05/22/2012 2206   AST 20 03/02/2023 1030   AST 34 05/22/2012 2206   ALT 13 03/02/2023 1030   ALT 31 05/22/2012 2206   ALKPHOS 94 11/07/2022 0955   ALKPHOS 115 05/22/2012 2206   BILITOT 0.5 03/02/2023 1030   BILITOT 0.3 11/26/2015 1122   BILITOT 0.3 05/22/2012 2206   GFRNONAA 37 (L) 11/07/2022 0955   GFRNONAA 41 (L) 02/04/2021 1123   GFRAA 48 (L) 02/04/2021 1123     No results found.     Assessment & Plan:   1. Lymphedema Recommend:   No surgery or intervention at this point in time.     Aloha Knieriem has had a longstanding history of Persistent, chronic and severe lower extremity lymphedema extending into the abdomen and groin area.  The severity is identified by continue to medial thigh edema with skin discoloration and had skin breakdown.For the past 4 weeks, the patient has tried various treatment options with no significant improvement including: Compliant daily wear of 30 mmHg distally compression wraps, regular daily elevation and exercise, along with multiple daily in-home sessions with the [id number] lymphedema pump.  In addition, all appropriate medications and dietary evaluations and changes have been implemented.  Recommendations of the E0652 calibrated programmable pump at appropriate garments to better treat the abdomen and groin area.  .   Patient should follow-up in six months    2. Benign essential HTN Continue antihypertensive medications as already ordered, these medications have been reviewed and there are no changes at this time.   3. Type 2 diabetes mellitus with stage 3 chronic kidney disease, without long-term current use of insulin, unspecified  whether stage 3a or 3b CKD (HCC) Continue hypoglycemic medications as already ordered, these medications have been reviewed and there are no changes at this time.  Hgb A1C to be monitored as already arranged by primary service    Current Outpatient Medications on File Prior to Visit  Medication Sig Dispense Refill    ACCU-CHEK GUIDE test strip TEST ONCE A DAY AS DIRECTED 50 strip 1   acetaminophen (TYLENOL) 650 MG CR tablet Take 650-1,300 mg by mouth 2 (two) times daily as needed for pain.     amiodarone (PACERONE) 200 MG tablet Take 200 mg by mouth daily.     amoxicillin (AMOXIL) 500 MG capsule Take 1 capsule (500 mg total) by mouth 2 (two) times daily. 60 capsule 12   apixaban (ELIQUIS) 5 MG TABS tablet Take 1 tablet by mouth 2 (two) times daily.     atorvastatin (LIPITOR) 40 MG tablet Take 1 tablet (40 mg total) by mouth every evening. 90 tablet 1   blood glucose meter kit and supplies 1 each by Other route daily at 12 noon. Dispense based on patient and insurance preference. Check fsbs once a day 1 each 0   Calcium Carbonate-Vitamin D 600-400 MG-UNIT tablet Take 1 tablet by mouth 2 (two) times daily. 60 tablet 0   dapagliflozin propanediol (FARXIGA) 10 MG TABS tablet Take 1 tablet (10 mg total) by mouth daily before breakfast. Patient receives via [place] Patient Assistance 90 tablet 3   escitalopram (LEXAPRO) 5 MG tablet TAKE 1 TABLET (5 MG TOTAL) BY MOUTH DAILY. 90 tablet 1   ferrous sulfate 325 (65 FE) MG EC tablet TAKE 1 TABLET BY MOUTH EVERY DAY 90 tablet 3   fluticasone (FLONASE) 50 MCG/ACT nasal spray SPRAY 2 SPRAYS INTO EACH NOSTRIL EVERY DAY 48 mL 0   hydroquinone 4 % cream Apply topically at bedtime.     latanoprost (XALATAN) 0.005 % ophthalmic solution SMARTSIG:In Eye(s)     levothyroxine (SYNTHROID) 50 MCG tablet Take 1 tablet (50 mcg total) by mouth daily. But take two tablets once a week 100 tablet 0   Magnesium Oxide 500 MG CAPS Take 500 mg by mouth daily.      metoprolol succinate (TOPROL-XL) 50 MG 24 hr tablet Take 50 mg by mouth daily.     omeprazole (PRILOSEC) 40 MG capsule Take 1 capsule (40 mg total) by mouth daily. 90 capsule 3   oxybutynin (DITROPAN-XL) 10 MG 24 hr tablet Take 1 tablet (10 mg total) by mouth daily. 90 tablet 3   oxyCODONE-acetaminophen (PERCOCET/ROXICET) 5-325 MG  tablet Take by mouth.     polyethylene glycol (MIRALAX / GLYCOLAX) 17 g packet Take 17 g by mouth daily. (Patient taking differently: Take 17 g by mouth every other day.) 14 each 0   potassium chloride (KLOR-CON) 10 MEQ tablet Take 10 mEq by mouth daily.     SIMBRINZA 1-0.2 % SUSP Apply to eye.     sodium chloride (OCEAN) 0.65 % SOLN nasal spray Place 1 spray into both nostrils 3 (three) times daily. 30 mL 0   torsemide (DEMADEX) 20 MG tablet TAKE 1 TABLET BY MOUTH EVERY DAY 90 tablet 1   No current facility-administered medications on file prior to visit.    There are no Patient Instructions on file for this visit. No follow-ups on file.   Georgiana Spinner, NP

## 2023-08-17 ENCOUNTER — Ambulatory Visit: Payer: Self-pay

## 2023-08-17 NOTE — Patient Outreach (Signed)
  Care Coordination   08/17/2023 Name: Becky Trevino MRN: [id number] DOB: June 16, 1946   Care Coordination Outreach Attempts:  An unsuccessful telephone outreach was attempted for a scheduled appointment today.  Follow Up Plan:  Additional outreach attempts will be made to offer the patient care coordination information and services.   Encounter Outcome:  No Answer   Care Coordination Interventions:  No, not indicated    Lysle Morales, BSW Social Worker [phone]

## 2023-08-17 NOTE — Patient Instructions (Signed)
Visit Information  Thank you for taking time to visit with me today. Please don't hesitate to contact me if I can be of assistance to you.   Following are the goals we discussed today:   Patient will utilities additional credit on [employer]. Patient will review other bills for possible reduction.   If you are experiencing a Mental Health or Behavioral Health Crisis or need someone to talk to, please call 911  Patient verbalizes understanding of instructions and care plan provided today and agrees to view in MyChart. Active MyChart status and patient understanding of how to access instructions and care plan via MyChart confirmed with patient.     No further follow up required: Patient does not request a follow up visit.  Lysle Morales, BSW Social Worker [phone]

## 2023-08-17 NOTE — Patient Outreach (Signed)
  Care Coordination   Initial Visit Note   08/17/2023 Name: Becky Trevino MRN: [id number] DOB: 01-Jul-1946  Becky Trevino is a 77 y.o. year old female who sees Alba Cory, MD for primary care. I spoke with  Mikel Cella by phone today.  What matters to the patients health and wellness today?  Patient reports concerns about paying bills.  Patient was previously seen 08/2022 by SW regarding finances and educated on resources in the community.  Patient is encouraged to review others bills to see if it is possible to reduce.      Goals Addressed             This Visit's Progress    Care Coordination Activities       Interventions Today    Flowsheet Row Most Recent Value  Chronic Disease   Chronic disease during today's visit Diabetes, Hypertension (HTN), Atrial Fibrillation (AFib), Chronic Kidney Disease/End Stage Renal Disease (ESRD)  General Interventions   General Interventions Discussed/Reviewed General Interventions Discussed, General Interventions Reviewed, Communication with  [Pt lives w/husband and sister and they all receive SSA. Husband works pt job. Pays important bills but doesnt have much for other bills. Sw informs pt there is no resources to supplement her income to pay bills monthly.]  Communication with --  [SW t/c [employer] for extra benefit options. Pt currently has OTC $50 credit and is using it quarterly. Pt is eligible for a $35 reward.]              SDOH assessments and interventions completed:  Yes  SDOH Interventions Today    Flowsheet Row Most Recent Value  SDOH Interventions   Food Insecurity Interventions Intervention Not Indicated  Housing Interventions Intervention Not Indicated  Transportation Interventions Other (Comment), Intervention Not Indicated  [ACT[employer] transportation]  Utilities Interventions Intervention Not Indicated        Care Coordination Interventions:  Yes, provided   Follow up plan: No further intervention required.    Encounter Outcome:  Patient Visit Completed

## 2023-08-19 ENCOUNTER — Other Ambulatory Visit: Payer: Self-pay | Admitting: Pharmacist

## 2023-08-19 ENCOUNTER — Encounter: Payer: Self-pay | Admitting: Pharmacist

## 2023-08-19 DIAGNOSIS — N1832 Chronic kidney disease, stage 3b: Secondary | ICD-10-CM

## 2023-08-19 DIAGNOSIS — E1140 Type 2 diabetes mellitus with diabetic neuropathy, unspecified: Secondary | ICD-10-CM

## 2023-08-19 MED ORDER — DAPAGLIFLOZIN PROPANEDIOL 10 MG PO TABS
10.0000 mg | ORAL_TABLET | Freq: Every day | ORAL | 3 refills | Status: DC
Start: 2023-08-19 — End: 2024-03-16

## 2023-08-19 NOTE — Progress Notes (Signed)
08/19/2023 Name: Becky Trevino MRN: [id number] DOB: 07-Mar-1946  Chief Complaint  Patient presents with   Medication Management   Medication Assistance    WRIGLEY REISIG is a 77 y.o. year old female who presented for a telephone visit.   They were referred to the pharmacist for assistance in managing medication access.    Subjective:  Care Team: Primary Care Provider: Alba Cory, MD; Next Scheduled Visit: 11/04/2023 Vascular Specialist: Renford Dills, MD; Next Scheduled Visit: 10/12/2023 Cardiologist: Mirian Capuchin, MD; Next Scheduled Visit: 10/19/2023 Urologist: Harle Battiest, PA-C  Orthopedic Specialist: Marlena Clipper, MD  Hematologist: [name], MD  Medication Access/Adherence  Current Pharmacy:  [facility] [address] [place] - [address] [place] [address] Phone: [phone] Fax: [phone]  [facility] - [place], [place] - [address] [place] [address] Phone: [phone] Fax: [phone]   Patient reports affordability concerns with their medications: No  Patient reports access/transportation concerns to their pharmacy: No  Patient reports adherence concerns with their medications:  No     Diabetes:  Current medications: Farxiga 10 mg daily  Denies checking home blood sugar recently  Patient denies hypoglycemic s/sx including dizziness, shakiness, sweating.   Statin therapy: atorvastatin 40 mg daily  Current medication access support: enrolled in patient assistance for Farxiga from [employer] - Reports that she completed re-enrollment via [employer] Writer and was notified that she was pre-approved for re-enrollment for 2025    Hypertension/ Atrial Fibrilation:  Current medications: - Rate Control: metoprolol ER 50 mg daily - Rhythm Control: amiodarone 200 mg daily - Anticoagulation Regimen: Eliquis 5 mg twice daily - torsemide 20 mg daily  CHADS2VASC:    Reports has a home blood pressure monitor, but denies checking home BP recently  Patient denies hypotensive s/sx including dizziness, lightheadedness.    Objective:  Lab Results  Component Value Date   HGBA1C 5.7 (A) 07/02/2023    Lab Results  Component Value Date   CREATININE 1.43 (H) 03/02/2023   BUN 21 03/02/2023   NA 141 03/02/2023   K 4.6 03/02/2023   CL 104 03/02/2023   CO2 32 03/02/2023    Lab Results  Component Value Date   CHOL 147 03/02/2023   HDL 60 03/02/2023   LDLCALC 75 03/02/2023   TRIG 43 03/02/2023   CHOLHDL 2.5 03/02/2023   BP Readings from Last 3 Encounters:  07/31/23 123/65  07/10/23 (!) 107/57  07/02/23 116/70   Pulse Readings from Last 3 Encounters:  07/31/23 (!) 57  07/10/23 (!) 53  07/02/23 64    Medications Reviewed Today     Reviewed by Manuela Neptune, RPH-[name] (Pharmacist) on 08/19/23 at 1455  Med List Status: <None>   Medication Order Taking? Sig Documenting Provider Last Dose Status Informant  ACCU-CHEK GUIDE test strip [id number]  TEST ONCE A DAY AS DIRECTED Alba Cory, MD  Active   acetaminophen (TYLENOL) 650 MG CR tablet [id number] Yes Take 650-1,300 mg by mouth 2 (two) times daily as needed for pain. [provider] Taking Active Spouse/Significant Other, Multiple Informants  amiodarone (PACERONE) 200 MG tablet [id number] Yes Take 200 mg by mouth daily. Dorothyann Peng D, MD Taking Active Multiple Informants  amoxicillin (AMOXIL) 500 MG capsule [id number] Yes Take 1 capsule (500 mg total) by mouth 2 (two) times daily. Lynn Ito, MD Taking Active   apixaban (ELIQUIS) 5 MG TABS tablet [id number] Yes Take  1 tablet by mouth 2 (two) times daily. Alwyn Pea, MD Taking Active Multiple Informants           Med Note Louanna Raw   ZOX Sep 14, 2021  9:30 PM)    atorvastatin (LIPITOR) 40 MG tablet [id number] Yes Take 1 tablet (40 mg total) by mouth every evening. Alba Cory, MD Taking Active    blood glucose meter kit and supplies [id number]  1 each by Other route daily at 12 noon. Dispense based on patient and insurance preference. Check fsbs once a day Alba Cory, MD  Active   Calcium Carbonate-Vitamin D 600-400 MG-UNIT tablet [id number] Yes Take 1 tablet by mouth 2 (two) times daily. Alba Cory, MD Taking Active Spouse/Significant Other, Multiple Informants  dapagliflozin propanediol (FARXIGA) 10 MG TABS tablet [id number] Yes Take 1 tablet (10 mg total) by mouth daily before breakfast. Patient receives via [employer] Patient Assistance Alba Cory, MD Taking Active   escitalopram (LEXAPRO) 5 MG tablet [id number] Yes TAKE 1 TABLET (5 MG TOTAL) BY MOUTH DAILY. Alba Cory, MD Taking Active   ferrous sulfate 325 (65 FE) MG EC tablet [id number] Yes TAKE 1 TABLET BY MOUTH EVERY DAY [name], MD Taking Active   fluticasone [facility]) 50 MCG/ACT nasal spray [id number] Yes SPRAY 2 SPRAYS INTO EACH NOSTRIL EVERY Kelle Darting, Danna Hefty, MD Taking Active   hydroquinone 4 % cream [id number] Yes Apply topically at bedtime as needed. [provider] Taking Active   latanoprost (XALATAN) 0.005 % ophthalmic solution [id number] Yes Place 1 drop into both eyes at bedtime. [provider] Taking Active Spouse/Significant Other, Multiple Informants  levothyroxine (SYNTHROID) 50 MCG tablet [id number] Yes Take 1 tablet (50 mcg total) by mouth daily. But take two tablets once a week Alba Cory, MD Taking Active   Magnesium Oxide 500 MG CAPS [id number] Yes Take 500 mg by mouth daily.  [provider] Taking Active Spouse/Significant Other, Multiple Informants           Med Note Floyce Stakes, [place] A   Wed Feb 27, 2016  1:43 PM)    metoprolol succinate (TOPROL-XL) 50 MG 24 hr tablet [id number] Yes Take 50 mg by mouth daily. Dorothyann Peng D, MD Taking Active Multiple Informants  omeprazole (PRILOSEC) 40 MG capsule [id number] Yes Take 1 capsule (40 mg total) by mouth daily. Celso Amy,  PA-C Taking Active   oxybutynin (DITROPAN-XL) 10 MG 24 hr tablet [id number] Yes Take 1 tablet (10 mg total) by mouth daily. Harle Battiest, PA-C Taking Active   oxyCODONE-acetaminophen (PERCOCET/ROXICET) 5-325 MG tablet [id number] Yes Take 1 tablet by mouth every 4 (four) hours as needed. [provider] Taking Active   polyethylene glycol (MIRALAX / GLYCOLAX) 17 g packet [id number] Yes Take 17 g by mouth daily.  Patient taking differently: Take 17 g by mouth every other day.   Lanae Boast, MD Taking Active Spouse/Significant Other, Multiple Informants  potassium chloride (KLOR-CON) 10 MEQ tablet [id number] Yes Take 10 mEq by mouth daily. Alwyn Pea, MD Taking Active Spouse/Significant Other, Multiple Informants  SIMBRINZA 1-0.2 % SUSP [id number] Yes Apply 1 drop to eye 2 (two) times daily. [provider] Taking Active   sodium chloride (OCEAN) 0.65 % SOLN nasal spray [id number]  Place 1 spray into both nostrils 3 (three) times daily. Enedina Finner, MD  Active Multiple Informants  torsemide (DEMADEX) 20 MG tablet [id number] Yes TAKE 1 TABLET BY MOUTH EVERY DAY Alba Cory, MD Taking Active   traZODone (DESYREL) 50 MG tablet  [id number] Yes TAKE 1/2 TO 1 TABLET (25-50 MG TOTAL) BY MOUTH AT BEDTIME AS NEEDED. FOR SLEEP Alba Cory, MD Taking Active   Med List Note Erasmo Leventhal 07/16/21 [id number]): Marland Kitchen              Assessment/Plan:   Comprehensive medication review performed; medication list updated in electronic medical record - Caution patient for risk of dizziness or sedation with Percocet and trazodone Patient verbalizes understanding. Reports only takes Percocet as needed and typically lies or sits down after taking - Denies drying side effects with oxybutynin ER. Denies any difficulty with swallowing her medications, including potassium - Confirms separates doses of her minerals (calcium, magnesium and iron) by at least 4 hours from her morning  levothyroxine dosing   Will collaborate with PCP to request provider e-prescribe renewal of Farxiga prescription to [facility] (dispensing pharmacy for AZ&Me program) for patient.   Diabetes: - Currently controlled - Recommend to keep a log when she checks home blood sugar and keep a record of these results - Patient to follow up with AZ[employer]Me as needed for refills of her Marcelline Deist  Hypertension/ Atrial Fibrilation: - Reviewed long term cardiovascular and renal outcomes of uncontrolled blood pressure - Reviewed importance of adherence to anticoagulant for stroke prevention. - Recommend to monitor home blood pressure, keep log of results and have this record to review at upcoming medical appointments. Patient to contact provider office sooner if needed for readings outside of established parameters or symptoms   Follow Up Plan: Clinical Pharmacist will follow up with patient by telephone in November 2025 regarding patient assistance program re-enrollment  Estelle Grumbles, PharmD, [facility] [phone]

## 2023-08-19 NOTE — Patient Instructions (Signed)
Goals Addressed             This Visit's Progress    Pharmacy Goals       If you need to reach out to AZ[employer]Me patient assistance program regarding refills or questions about re-enrollment, you can do so by calling [phone].      Thank you!   Estelle Grumbles, PharmD, [facility] [phone]

## 2023-08-27 ENCOUNTER — Ambulatory Visit
Admission: RE | Admit: 2023-08-27 | Discharge: 2023-08-27 | Disposition: A | Discharge status: Home / Self Care | Payer: Medicare Other | Source: Ambulatory Visit | Attending: Family Medicine | Admitting: Family Medicine

## 2023-08-27 DIAGNOSIS — Z1231 Encounter for screening mammogram for malignant neoplasm of breast: Secondary | ICD-10-CM | POA: Insufficient documentation

## 2023-08-29 ENCOUNTER — Other Ambulatory Visit: Payer: Self-pay | Admitting: Family Medicine

## 2023-08-29 DIAGNOSIS — E039 Hypothyroidism, unspecified: Secondary | ICD-10-CM

## 2023-08-31 ENCOUNTER — Other Ambulatory Visit: Payer: Self-pay

## 2023-08-31 DIAGNOSIS — E039 Hypothyroidism, unspecified: Secondary | ICD-10-CM

## 2023-08-31 MED ORDER — LEVOTHYROXINE SODIUM 50 MCG PO TABS: 50.0000 ug | ORAL_TABLET | Freq: Every day | ORAL | 0 refills | Status: DC

## 2023-09-15 DIAGNOSIS — U071 COVID-19: Secondary | ICD-10-CM | POA: Diagnosis not present

## 2023-09-15 DIAGNOSIS — I480 Paroxysmal atrial fibrillation: Secondary | ICD-10-CM | POA: Diagnosis not present

## 2023-09-15 DIAGNOSIS — Z03818 Encounter for observation for suspected exposure to other biological agents ruled out: Secondary | ICD-10-CM | POA: Diagnosis not present

## 2023-09-22 ENCOUNTER — Encounter: Payer: Self-pay | Admitting: Family Medicine

## 2023-09-22 ENCOUNTER — Telehealth (INDEPENDENT_AMBULATORY_CARE_PROVIDER_SITE_OTHER): Payer: Medicare Other | Admitting: Family Medicine

## 2023-09-22 DIAGNOSIS — U099 Post covid-19 condition, unspecified: Secondary | ICD-10-CM | POA: Diagnosis not present

## 2023-09-22 DIAGNOSIS — R059 Cough, unspecified: Secondary | ICD-10-CM | POA: Diagnosis not present

## 2023-09-22 MED ORDER — BENZONATATE 100 MG PO CAPS: 100.0000 mg | ORAL_CAPSULE | Freq: Two times a day (BID) | ORAL | 0 refills | Status: DC | PRN

## 2023-09-22 NOTE — Progress Notes (Signed)
 Name: Becky Trevino   MRN: [id number]    DOB: June 03, 1946   Date:09/22/2023       Progress Note  Subjective  Chief Complaint  Chief Complaint  Patient presents with   Cough    Onset for 2 weeks or more seen walk in clinic on 09/15/23 [facility]    I connected with  Dagoberto JAYSON Dickinson  on 09/22/23 at  3:20 PM EST by a video enabled telemedicine application and verified that I am speaking with the correct person using two identifiers.  I discussed the limitations of evaluation and management by telemedicine and the availability of in person appointments. The patient expressed understanding and agreed to proceed with a virtual visit  Staff also discussed with the patient that there may be a patient responsible charge related to this service. Patient Location: at home  Provider Location: [facility] Additional Individuals present: alone   HPI  Discussed the use of AI scribe software for clinical note transcription with the patient, who gave verbal consent to proceed.  History of Present Illness   The patient, previously diagnosed with COVID-19, presented with a persistent cough and residual congestion. She initially experienced symptoms, including a runny nose and congestion, during the week of Christmas. Approximately a week later, she sought care at an urgent care facility where she was tested and informed she had COVID-19 but was outside of the treatment window for anti-viral medication. The provider at the urgent care facility indicated that the patient was past the infectious stage of the disease, being on the eighth day of symptoms.  The patient was prescribed doxycycline  for ten days and promethazine DM to manage her symptoms. Despite completing the course of antibiotics and using the cough syrup, the patient continues to experience a cough and minor congestion. She reported feeling better overall, but the cough has been persistent. She has previously used Tessalon  Perles with some success in managing her  cough.  In addition to the cough and congestion, the patient experienced a sore throat, which has since resolved. She has been using cough drops to help manage this symptom. The patient reported an improvement in her appetite and denied experiencing diarrhea or shortness of breath. She has been taking the prescribed cough syrup in the morning and at night.          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) 12/01/2022   Interstitial lung disease (HCC) 12/01/2022   Atherosclerosis of native arteries of extremity with intermittent claudication (HCC) 10/16/2022   Anemia due to chronic kidney disease 08/26/2022   Hypothyroidism (acquired) 01/27/2022   PAD (peripheral artery disease) (HCC) 09/27/2021   AF (paroxysmal atrial fibrillation) (HCC) 09/15/2021   Primary osteoarthritis of right hip 07/18/2021   Atherosclerosis of aorta (HCC) 06/07/2021   Mild protein-calorie malnutrition (HCC)    Hyperbilirubinemia    Injury of left femoral nerve 08/28/2020   Lymphedema 06/25/2020   History of pelvic hematoma 02/10/2020   Chronic venous hypertension due to deep vein thrombosis (DVT) 10/30/2019   Normocytic anemia 08/07/2019   Chronic infection of right knee (HCC) 11/25/2018   Infection and inflammatory reaction due to internal joint  prosthesis, subsequent encounter 01/25/2018   Infection of total right knee replacement (HCC) 11/19/2017   Type 2 diabetes mellitus with stage 3b chronic kidney disease, without long-term current use of insulin  (HCC) 11/06/2016   Primary osteoarthritis of knee 03/11/2016   Primary osteoarthritis of left hip  12/11/2015   Essential hypertension 04/21/2015   Chronic kidney disease (CKD), stage III (moderate) (HCC) 04/21/2015   Chronic venous insufficiency 04/21/2015   DDD (degenerative disc disease), cervical 04/21/2015   Diabetes mellitus with renal manifestation (HCC) 04/21/2015   Dyslipidemia 04/21/2015   Bladder cystocele 04/21/2015   Urinary incontinence 04/21/2015   Leg varices 04/21/2015   Insomnia 04/21/2015   Eczema intertrigo 04/21/2015   Morbid obesity (HCC) 04/21/2015   OSA (obstructive sleep apnea) 04/21/2015   Osteoarthritis of multiple joints 04/21/2015   H/O Spinal surgery 11/14/2013   Detrusor muscle hypertonia 11/07/2013    Social History   Tobacco Use   Smoking status: Never    Passive exposure: Never   Smokeless tobacco: Never  Substance Use Topics   Alcohol use: No    Alcohol/week: 0.0 standard drinks of alcohol     Current Outpatient Medications:    ACCU-CHEK GUIDE test strip, TEST ONCE A DAY AS DIRECTED, Disp: 50 strip, Rfl: 1   acetaminophen  (TYLENOL ) 650 MG CR tablet, Take 650-1,300 mg by mouth 2 (two) times daily as needed for pain., Disp: , Rfl:    amiodarone  (PACERONE ) 200 MG tablet, Take 200 mg by mouth daily., Disp: , Rfl:    apixaban  (ELIQUIS ) 5 MG TABS tablet, Take 1 tablet by mouth 2 (two) times daily., Disp: , Rfl:    atorvastatin  (LIPITOR) 40 MG tablet, Take 1 tablet (40 mg total) by mouth every evening., Disp: 90 tablet, Rfl: 1   blood glucose meter kit and supplies, 1 each by Other route daily at 12 noon. Dispense based on patient and insurance preference. Check fsbs once a day, Disp: 1 each, Rfl: 0   Calcium  Carbonate-Vitamin D  600-400 MG-UNIT tablet, Take 1 tablet by mouth 2 (two) times daily., Disp: 60 tablet, Rfl: 0   dapagliflozin  propanediol (FARXIGA ) 10 MG TABS tablet, Take 1 tablet (10 mg total) by mouth daily before breakfast. Patient receives via AZ&ME Patient Assistance, Disp: 90 tablet, Rfl: 3   doxycycline  (VIBRA -TABS) 100 MG tablet,  Take by mouth., Disp: , Rfl:    escitalopram  (LEXAPRO ) 5 MG tablet, TAKE 1 TABLET (5 MG TOTAL) BY MOUTH DAILY., Disp: 90 tablet, Rfl: 1   ferrous sulfate  325 (65 FE) MG EC tablet, TAKE 1 TABLET BY MOUTH EVERY DAY, Disp: 90 tablet, Rfl: 3   fluticasone  (FLONASE ) 50 MCG/ACT nasal spray, SPRAY 2 SPRAYS INTO EACH NOSTRIL EVERY DAY, Disp: 48 mL, Rfl: 0   hydroquinone 4 % cream, Apply topically at bedtime as needed., Disp: , Rfl:    latanoprost  (XALATAN ) 0.005 % ophthalmic solution, Place 1 drop into both eyes at bedtime., Disp: , Rfl:    levothyroxine  (SYNTHROID ) 50 MCG tablet, Take 1 tablet (50 mcg total) by mouth daily. But take two tablets once a week, Disp: 100 tablet, Rfl: 0   Magnesium  Oxide 500 MG CAPS, Take 500 mg by mouth daily. , Disp: , Rfl:    metoprolol  succinate (TOPROL -XL) 50 MG 24 hr tablet, Take 50 mg by mouth daily., Disp: , Rfl:    omeprazole  (PRILOSEC) 40 MG capsule, Take 1 capsule (40 mg total) by mouth daily., Disp: 90 capsule, Rfl: 3   oxybutynin  (DITROPAN -XL) 10 MG 24 hr tablet,  Take 1 tablet (10 mg total) by mouth daily., Disp: 90 tablet, Rfl: 3   oxyCODONE -acetaminophen  (PERCOCET/ROXICET) 5-325 MG tablet, Take 1 tablet by mouth every 4 (four) hours as needed., Disp: , Rfl:    polyethylene glycol (MIRALAX  / GLYCOLAX ) 17 g packet, Take 17 g by mouth daily. (Patient taking differently: Take 17 g by mouth every other day.), Disp: 14 each, Rfl: 0   potassium chloride  (KLOR-CON ) 10 MEQ tablet, Take 10 mEq by mouth daily., Disp: , Rfl:    SIMBRINZA 1-0.2 % SUSP, Apply 1 drop to eye 2 (two) times daily., Disp: , Rfl:    sodium chloride  (OCEAN) 0.65 % SOLN nasal spray, Place 1 spray into both nostrils 3 (three) times daily., Disp: 30 mL, Rfl: 0   torsemide  (DEMADEX ) 20 MG tablet, TAKE 1 TABLET BY MOUTH EVERY DAY, Disp: 90 tablet, Rfl: 1   traZODone  (DESYREL ) 50 MG tablet, TAKE 1/2 TO 1 TABLET (25-50 MG TOTAL) BY MOUTH AT BEDTIME AS NEEDED. FOR SLEEP, Disp: 90 tablet, Rfl: 0    amoxicillin  (AMOXIL ) 500 MG capsule, Take 1 capsule (500 mg total) by mouth 2 (two) times daily., Disp: 60 capsule, Rfl: 12   promethazine-dextromethorphan  (PROMETHAZINE-DM) 6.25-15 MG/5ML syrup, Take by mouth., Disp: , Rfl:   Allergies  Allergen Reactions   Lisinopril Swelling    I personally reviewed active problem list, medication list, allergies, family history with the patient/caregiver today.  ROS  Ten systems reviewed and is negative except as mentioned in HPI     Objective  Virtual encounter, vitals not obtained.  There is no height or weight on file to calculate BMI.  Nursing Note and Vital Signs reviewed.  Physical Exam  Awake, alert and oriented   Assessment & Plan     Post-Acute COVID-19 Syndrome Persistent cough following a recent COVID-19 infection. No other symptoms. No shortness of breath, diarrhea, or loss of appetite. -Prescribe Tessalon  Perles for cough relief. -Advise to stay hydrated. -Continue monitoring symptoms and seek medical attention if condition worsens.      -Red flags and when to present for emergency care or RTC including fever >101.72F, chest pain, shortness of breath, new/worsening/un-resolving symptoms,  reviewed with patient at time of visit. Follow up and care instructions discussed and provided in AVS. - I discussed the assessment and treatment plan with the patient. The patient was provided an opportunity to ask questions and all were answered. The patient agreed with the plan and demonstrated an understanding of the instructions.  I provided 15  minutes of non-face-to-face time during this encounter.  Chaley Castellanos F Dmari Schubring, MD

## 2023-09-30 ENCOUNTER — Telehealth: Payer: Self-pay | Admitting: Family Medicine

## 2023-09-30 NOTE — Telephone Encounter (Signed)
 Called pt back and informed her I have not seen any paperwork related to it. I advised to call the company and have them re fax it to us  at [phone], pt verbalized understanding

## 2023-09-30 NOTE — Telephone Encounter (Signed)
 Pt is calling in because she says the company that services her [employer] sent a paper for Dr. Ava Trevino to sign and send back and they have not received it back. Pt is requesting a call back regarding this situation.

## 2023-10-09 ENCOUNTER — Other Ambulatory Visit: Payer: Self-pay | Admitting: Family Medicine

## 2023-10-09 ENCOUNTER — Telehealth: Payer: Self-pay | Admitting: Family Medicine

## 2023-10-09 DIAGNOSIS — R059 Cough, unspecified: Secondary | ICD-10-CM

## 2023-10-09 MED ORDER — BENZONATATE 100 MG PO CAPS: 100.0000 mg | ORAL_CAPSULE | Freq: Two times a day (BID) | ORAL | 0 refills | Status: DC | PRN

## 2023-10-09 NOTE — Telephone Encounter (Signed)
Copied from CRM [id number]. Topic: General - Inquiry >> Oct 08, 2023  4:02 PM Haroldine Laws wrote: Reason for CRM: pt would like Becky Trevino to call her back. She just ask for Becky Trevino .  Did not disclose   CB#  [phone]

## 2023-10-09 NOTE — Telephone Encounter (Signed)
Pt had video visit on 09/22/23 due to cough

## 2023-10-09 NOTE — Telephone Encounter (Signed)
Pt requesting a refill on her tessalon pearls or some type of cough medicaton like robitussin. She had a wellness visit on yesterday and the lady told her that she could her a little crackling in her chest. I did have the pt to schedule an appointment for next week. She was not able to come today due to transportation. But she did want me to sent this message to see if you would refill her meds

## 2023-10-11 NOTE — Progress Notes (Deleted)
MRN : [id number]  Becky Trevino is a 78 y.o. (1946/06/01) female who presents with chief complaint of check circulation.  History of Present Illness:   The patient returns to the office for followup evaluation regarding leg swelling.  The swelling has persisted and the pain associated with swelling continues. There have not been any interval development of a ulcerations or wounds.   Since the previous visit the patient has been wearing graduated compression wraps on a daily basis and has noted little if any improvement in the lymphedema. The patient has been using compression routinely morning until night.   The patient also states elevation during the day and exercise is being done too.   She does have a lymph pump but the sleeves apparently have an abdominal component and she is not able to apply the sleeves in a manner that is functional.  She states that in the remote past when she had 2 separate leggings this worked much better.  Given this problem she is really not been able to utilize her lymph pump.   No outpatient medications have been marked as taking for the 10/12/23 encounter (Appointment) with Gilda Crease, Latina Craver, MD.    Past Medical History:  Diagnosis Date   Cervical spondylosis    Chronic kidney disease    Stage 3   DDD (degenerative disc disease), cervical    [facility]   Diabetes mellitus without complication (HCC)    Diverticulosis    Hyperlipidemia    Hypertension    Intertrigo    Leg cramps    Leg varices    Obesity    OSA (obstructive sleep apnea)    Symptomatic menopausal or female climacteric states    Syncope     Past Surgical History:  Procedure Laterality Date   ABDOMINAL HYSTERECTOMY  1993   Total   BUNIONECTOMY Bilateral 1993   ESOPHAGOGASTRODUODENOSCOPY (EGD) WITH PROPOFOL N/A 05/13/2023   Procedure: ESOPHAGOGASTRODUODENOSCOPY (EGD) WITH PROPOFOL;  Surgeon: [name], MD;  Location: [facility] ENDOSCOPY;  Service: Gastroenterology;  Laterality: N/A;   I & D KNEE WITH POLY EXCHANGE Right 11/19/2017   Procedure: RIGHT KNEE POLY EXCHANGE WITH IRRIGATION AND DEBRIDEMENT;  Surgeon: Kennedy Bucker, MD;  Location: [facility] ORS;  Service: Orthopedics;  Laterality: Right;   IR FLUORO GUIDED NEEDLE PLC ASPIRATION/INJECTION LOC  09/19/2021   JOINT REPLACEMENT     bilateral knee   LAMINECTOMY  11/14/2013    Cervical Fusion , [facility], Dr. Timoteo Ace   LASER ABLATION Bilateral 07/29/2012   Dr. Wyn Quaker   LOWER EXTREMITY ANGIOGRAPHY Right 11/02/2019   Procedure: Lower Extremity Angiography;  Surgeon: Renford Dills, MD;  Location: [facility] INVASIVE CV LAB;  Service: Cardiovascular;  Laterality: Right;   ORIF ANKLE FRACTURE Right 09/02/2019   Procedure: OPEN REDUCTION INTERNAL FIXATION (ORIF) ANKLE FRACTURE BIMALLEOLAR;  Surgeon: Rosetta Posner, DPM;  Location: [facility] ORS;  Service: Podiatry;  Laterality: Right;   PERIPHERAL VASCULAR THROMBECTOMY Right 03/02/2020   Procedure: PERIPHERAL VASCULAR THROMBECTOMY;  Surgeon: Annice Needy, MD;  Location: [facility] INVASIVE CV LAB;  Service: Cardiovascular;  Laterality: Right;   REPLACEMENT  TOTAL KNEE Left 03/2010   Dr. Rosita Kea   TEE WITHOUT CARDIOVERSION N/A 09/19/2021   Procedure: TRANSESOPHAGEAL ECHOCARDIOGRAM (TEE);  Surgeon: Lamar Blinks, MD;  Location: [facility] ORS;  Service: Cardiovascular;  Laterality: N/A;   TOTAL HIP ARTHROPLASTY Left 12/11/2015   Procedure: TOTAL HIP ARTHROPLASTY ANTERIOR APPROACH;  Surgeon: Kennedy Bucker, MD;  Location: [facility] ORS;  Service: Orthopedics;  Laterality: Left;   TOTAL KNEE ARTHROPLASTY Right 03/11/2016   Procedure: TOTAL KNEE ARTHROPLASTY;  Surgeon: Kennedy Bucker, MD;  Location: [facility] ORS;  Service: Orthopedics;  Laterality: Right;    Social History Social History   Tobacco Use   Smoking status: Never    Passive exposure: Never   Smokeless tobacco: Never  Vaping Use   Vaping status: Never Used  Substance Use Topics    Alcohol use: No    Alcohol/week: 0.0 standard drinks of alcohol   Drug use: No    Family History Family History  Problem Relation Age of Onset   Diabetes Mother    Hyperlipidemia Mother    Hypertension Mother    Diabetes Father    Hyperlipidemia Father    Hypertension Father    Obesity Father    Hypertension Sister    Hyperlipidemia Sister    Hyperlipidemia Sister    Breast cancer Neg Hx     Allergies  Allergen Reactions   Lisinopril Swelling     REVIEW OF SYSTEMS (Negative unless checked)  Constitutional: [] Weight loss  [] Fever  [] Chills Cardiac: [] Chest pain   [] Chest pressure   [] Palpitations   [] Shortness of breath when laying flat   [] Shortness of breath with exertion. Vascular:  [x] Pain in legs with walking   [] Pain in legs at rest  [] History of DVT   [] Phlebitis   [] Swelling in legs   [] Varicose veins   [] Non-healing ulcers Pulmonary:   [] Uses home oxygen   [] Productive cough   [] Hemoptysis   [] Wheeze  [] COPD   [] Asthma Neurologic:  [] Dizziness   [] Seizures   [] History of stroke   [] History of TIA  [] Aphasia   [] Vissual changes   [] Weakness or numbness in arm   [] Weakness or numbness in leg Musculoskeletal:   [] Joint swelling   [] Joint pain   [] Low back pain Hematologic:  [] Easy bruising  [] Easy bleeding   [] Hypercoagulable state   [] Anemic Gastrointestinal:  [] Diarrhea   [] Vomiting  [] Gastroesophageal reflux/heartburn   [] Difficulty swallowing. Genitourinary:  [] Chronic kidney disease   [] Difficult urination  [] Frequent urination   [] Blood in urine Skin:  [] Rashes   [] Ulcers  Psychological:  [] History of anxiety   []  History of major depression.  Physical Examination  There were no vitals filed for this visit. There is no height or weight on file to calculate BMI. Gen: WD/WN, NAD Head: [place]/AT, No temporalis wasting.  Ear/Nose/Throat: Hearing grossly intact, nares w/o erythema or drainage Eyes: PER, EOMI, sclera nonicteric.  Neck: Supple, no masses.  No bruit or  JVD.  Pulmonary:  Good air movement, no audible wheezing, no use of accessory muscles.  Cardiac: RRR, normal S1, S2, no Murmurs. Vascular:  scattered varicosities present bilaterally.  Mild venous stasis changes to the legs bilaterally.  3-4+ soft pitting edema, CEAP [id number] mild trophic changes, no open wounds Vessel Right Left  Radial Palpable Palpable  PT Not Palpable Not Palpable  DP Not Palpable Not Palpable  Gastrointestinal: soft, non-distended. No guarding/no peritoneal signs.  Musculoskeletal: M/S 5/5 throughout.  No visible deformity.  Neurologic: CN 2-12 intact. Pain and light touch intact  in extremities.  Symmetrical.  Speech is fluent. Motor exam as listed above. Psychiatric: Judgment intact, Mood & affect appropriate for pt's clinical situation. Dermatologic: No rashes or ulcers noted.  No changes consistent with cellulitis.   CBC Lab Results  Component Value Date   WBC 4.8 06/29/2023   HGB 12.2 06/29/2023   HCT 38.8 06/29/2023   MCV 97.0 06/29/2023   PLT 179 06/29/2023    BMET    Component Value Date/Time   NA 141 03/02/2023 1030   NA 142 11/26/2015 1122   NA 141 10/18/2012 1716   K 4.6 03/02/2023 1030   K 3.5 10/18/2012 1716   CL 104 03/02/2023 1030   CL 110 (H) 10/18/2012 1716   CO2 32 03/02/2023 1030   CO2 24 10/18/2012 1716   GLUCOSE 75 03/02/2023 1030   GLUCOSE 128 (H) 10/18/2012 1716   BUN 21 03/02/2023 1030   BUN 27 11/26/2015 1122   BUN 31 (H) 10/18/2012 1716   CREATININE 1.43 (H) 03/02/2023 1030   CALCIUM 9.1 03/02/2023 1030   CALCIUM 7.9 (L) 10/18/2012 1716   GFRNONAA 37 (L) 11/07/2022 0955   GFRNONAA 41 (L) 02/04/2021 1123   GFRAA 48 (L) 02/04/2021 1123   CrCl cannot be calculated (Patient's most recent lab result is older than the maximum 21 days allowed.).  COAG Lab Results  Component Value Date   INR 1.8 (H) 09/19/2021   INR 1.2 07/15/2021   INR 1.1 05/01/2020    Radiology No results found.   Assessment/Plan There are no  diagnoses linked to this encounter.   Levora Dredge, MD  10/11/2023 1:39 PM

## 2023-10-12 ENCOUNTER — Encounter (INDEPENDENT_AMBULATORY_CARE_PROVIDER_SITE_OTHER): Payer: Medicare Other

## 2023-10-12 ENCOUNTER — Ambulatory Visit (INDEPENDENT_AMBULATORY_CARE_PROVIDER_SITE_OTHER): Payer: Medicare Other | Admitting: Vascular Surgery

## 2023-10-12 ENCOUNTER — Encounter (INDEPENDENT_AMBULATORY_CARE_PROVIDER_SITE_OTHER): Payer: Self-pay

## 2023-10-12 DIAGNOSIS — I70213 Atherosclerosis of native arteries of extremities with intermittent claudication, bilateral legs: Secondary | ICD-10-CM

## 2023-10-12 DIAGNOSIS — I10 Essential (primary) hypertension: Secondary | ICD-10-CM

## 2023-10-12 DIAGNOSIS — I480 Paroxysmal atrial fibrillation: Secondary | ICD-10-CM

## 2023-10-12 DIAGNOSIS — I872 Venous insufficiency (chronic) (peripheral): Secondary | ICD-10-CM

## 2023-10-12 DIAGNOSIS — I890 Lymphedema, not elsewhere classified: Secondary | ICD-10-CM

## 2023-10-12 NOTE — Telephone Encounter (Signed)
Pt.notified

## 2023-10-13 ENCOUNTER — Ambulatory Visit: Payer: Medicare Other | Admitting: Family Medicine

## 2023-10-19 DIAGNOSIS — M1990 Unspecified osteoarthritis, unspecified site: Secondary | ICD-10-CM | POA: Diagnosis not present

## 2023-10-19 DIAGNOSIS — M21372 Foot drop, left foot: Secondary | ICD-10-CM | POA: Diagnosis not present

## 2023-10-19 DIAGNOSIS — N1831 Chronic kidney disease, stage 3a: Secondary | ICD-10-CM | POA: Diagnosis not present

## 2023-10-19 DIAGNOSIS — I890 Lymphedema, not elsewhere classified: Secondary | ICD-10-CM | POA: Diagnosis not present

## 2023-10-19 DIAGNOSIS — M5033 Other cervical disc degeneration, cervicothoracic region: Secondary | ICD-10-CM | POA: Diagnosis not present

## 2023-10-25 ENCOUNTER — Other Ambulatory Visit: Payer: Self-pay | Admitting: Family Medicine

## 2023-10-25 DIAGNOSIS — G4709 Other insomnia: Secondary | ICD-10-CM

## 2023-10-25 DIAGNOSIS — E039 Hypothyroidism, unspecified: Secondary | ICD-10-CM

## 2023-10-26 NOTE — Telephone Encounter (Signed)
 Requested Prescriptions  Pending Prescriptions Disp Refills   traZODone  (DESYREL ) 50 MG tablet [Pharmacy Med Name: TRAZODONE  50 MG TABLET] 90 tablet 0    Sig: TAKE 1/2 TO 1 TABLET (25-50 MG TOTAL) BY MOUTH AT BEDTIME AS NEEDED. FOR SLEEP     Psychiatry: Antidepressants - Serotonin Modulator Passed - 10/26/2023  4:36 PM      Passed - Valid encounter within last 6 months    Recent Outpatient Visits           1 month ago Post-acute COVID-19 syndrome   [facility] [place], Arvis Laura, MD   3 months ago Type 2 diabetes mellitus with diabetic neuropathy, without long-term current use of insulin  [facility])   [facility] Arleen Lacer, MD   6 months ago Hypothyroidism (acquired)   [place] [facility] Arleen Lacer, MD   7 months ago Type 2 diabetes mellitus with diabetic neuropathy, without long-term current use of insulin  [facility])   [place] [facility] Arleen Lacer, MD   10 months ago Type 2 diabetes mellitus with diabetic neuropathy, without long-term current use of insulin  [facility])   [place] [facility] Arleen Lacer, MD       Future Appointments             In 1 week Sowles, Krichna, MD [facility]   In 8 months Alica Inks, MD [facility]   In 8 months [name], Nyra Bellis [facility] [place]            Refused Prescriptions Disp Refills   levothyroxine  (SYNTHROID ) 50 MCG tablet [Pharmacy Med Name: LEVOTHYROXINE  50 MCG TABLET] 100 tablet 0    Sig: TAKE 1 TABLET (50 MCG TOTAL) BY MOUTH DAILY. BUT TAKE TWO TABLETS ONCE A WEEK     Endocrinology:  Hypothyroid Agents Passed - 10/26/2023  4:36 PM      Passed - TSH in normal range and within 360 days    TSH  Date Value Ref Range Status  07/02/2023 3.68 0.40 - 4.50 mIU/L Final         Passed - Valid encounter within last 12 months    Recent Outpatient  Visits           1 month ago Post-acute COVID-19 syndrome   [place] [facility] [place], Arvis Laura, MD   3 months ago Type 2 diabetes mellitus with diabetic neuropathy, without long-term current use of insulin  [facility])   [place] [facility] Arleen Lacer, MD   6 months ago Hypothyroidism (acquired)   [facility] Arleen Lacer, MD   7 months ago Type 2 diabetes mellitus with diabetic neuropathy, without long-term current use of insulin  [facility])   [facility] Sowles, Krichna, MD   10 months ago Type 2 diabetes mellitus with diabetic neuropathy, without long-term current use of insulin  [facility])   [place] [facility] Arleen Lacer, MD       Future Appointments             In 1 week Sowles, Krichna, MD [facility]   In 8 months Alica Inks, MD [facility]   In 8 months [name], Nyra Bellis [facility] [place]

## 2023-10-29 ENCOUNTER — Other Ambulatory Visit: Payer: Self-pay | Admitting: Family Medicine

## 2023-10-29 DIAGNOSIS — E039 Hypothyroidism, unspecified: Secondary | ICD-10-CM

## 2023-10-29 NOTE — Telephone Encounter (Signed)
,  Medication Refill - Most Recent Primary Care Visit:  Provider: Alba Cory  Department: [facility]  Visit Type: MYCHART VIDEO VISIT  Date: 09/22/2023  Medication: levothyroxine (SYNTHROID) 50 MCG tablet  Requesting refill, pt requested on 10/26/23 and denied due to request being too soon. Pt will run out on Monday, 11/02/2023.  Has the patient contacted their pharmacy? Yes   Is this the correct pharmacy for this prescription? Yes This is the patient's preferred pharmacy:  [facility] [address] [place] - [address] [place] [place] [address] Phone: [phone] Fax: [phone]   Has the prescription been filled recently? No  Is the patient out of the medication? No  Has the patient been seen for an appointment in the last year OR does the patient have an upcoming appointment? Yes  Can we respond through MyChart? Yes  Agent: Please be advised that Rx refills may take up to 3 business days. We ask that you follow-up with your pharmacy.

## 2023-11-02 ENCOUNTER — Telehealth: Payer: Medicare Other | Admitting: Family Medicine

## 2023-11-02 ENCOUNTER — Encounter: Payer: Self-pay | Admitting: Family Medicine

## 2023-11-02 ENCOUNTER — Telehealth: Payer: Self-pay | Admitting: Family Medicine

## 2023-11-02 DIAGNOSIS — M009 Pyogenic arthritis, unspecified: Secondary | ICD-10-CM

## 2023-11-02 DIAGNOSIS — N1832 Chronic kidney disease, stage 3b: Secondary | ICD-10-CM

## 2023-11-02 DIAGNOSIS — I700 Atherosclerosis of aorta: Secondary | ICD-10-CM | POA: Diagnosis not present

## 2023-11-02 DIAGNOSIS — G4733 Obstructive sleep apnea (adult) (pediatric): Secondary | ICD-10-CM

## 2023-11-02 DIAGNOSIS — I739 Peripheral vascular disease, unspecified: Secondary | ICD-10-CM

## 2023-11-02 DIAGNOSIS — I152 Hypertension secondary to endocrine disorders: Secondary | ICD-10-CM

## 2023-11-02 DIAGNOSIS — E119 Type 2 diabetes mellitus without complications: Secondary | ICD-10-CM

## 2023-11-02 DIAGNOSIS — E039 Hypothyroidism, unspecified: Secondary | ICD-10-CM

## 2023-11-02 DIAGNOSIS — I82541 Chronic embolism and thrombosis of right tibial vein: Secondary | ICD-10-CM

## 2023-11-02 DIAGNOSIS — K224 Dyskinesia of esophagus: Secondary | ICD-10-CM

## 2023-11-02 DIAGNOSIS — E1140 Type 2 diabetes mellitus with diabetic neuropathy, unspecified: Secondary | ICD-10-CM | POA: Diagnosis not present

## 2023-11-02 DIAGNOSIS — Z7984 Long term (current) use of oral hypoglycemic drugs: Secondary | ICD-10-CM

## 2023-11-02 DIAGNOSIS — I480 Paroxysmal atrial fibrillation: Secondary | ICD-10-CM | POA: Diagnosis not present

## 2023-11-02 NOTE — Telephone Encounter (Signed)
Copied from CRM [id number]. Topic: General - Other >> Nov 02, 2023  9:09 AM Shon Hale wrote: Reason for CRM: Pt calling to speak to [facility]. Pt checking if paperwork was received by hover around to fix mobility chair.  Requesting callback . [phone]

## 2023-11-02 NOTE — Progress Notes (Signed)
Name: Becky Trevino   MRN: [id number]    DOB: Jan 14, 1946   Date:11/02/2023       Progress Note  Subjective  Chief Complaint  Chief Complaint  Patient presents with   Medical Management of Chronic Issues    I connected with  Mikel Cella  on 11/02/23 at  3:20 PM EST by a video enabled telemedicine application and verified that I am speaking with the correct person using two identifiers.  I discussed the limitations of evaluation and management by telemedicine and the availability of in person appointments. The patient expressed understanding and agreed to proceed with the virtual visit  Staff also discussed with the patient that there may be a patient responsible charge related to this service. Patient Location: at home  Provider Location: [facility] Additional Individuals present: Carney Bern  HPI   DMII with renal manifestation and obesity:  she denies polyuria, polyphagia or polydipsia. A1C has been controlled. She has dyslipidemia and obesity . She cannot take ACE due to angioedema. She is still  taking Farxiga for CKI and DM, she denies side effects. She states statins for dyslipidemia. She has neuropathy , she is under the care of podiatrist    Esophageal dysmotility: seen by Dr. Tobi Bastos, advised to take PPI  , she denies dysphagia or odynophagia   Chronic DVT  right popliteal vein: diagnosed 10/27/2019,  she has an IV filter. Dr. Cathie Hoops had stopped Eliquis but had psoas hematoma but since resolution of hematoma Dr. Juliann Pares placed her back on it and no side effects. She states she is doing well    Hyperlipidemia: taking Atorvastatin daily and denies side effects. No chest pain , no muscles pain. LDL was slightly at 75 , we will recheck it yearly   Insomnia/OSA :Last Echo done by Dr. Juliann Pares showed mild pulmonary hypertension. She has still not been approved for CPAP machine   OA: multiple joints, sees Ortho, Dr. Rosita Kea . She had revision of right knee replacement March 2019 because of infection.  Still under the care of Ortho now also seeing Dr. Owens Loffler . She is still on daily antibiotics - Amoxicillin , no side effects   Left femoral nerve palsy:  seen by Dr. Rosita Kea, Dr. Sherryll Burger, now seeing neurosurgeon at [facility] , Dr. Katrinka Blazing. She is able to walk short distances but s wearing brace because left knee is unstable due to femoral nerve palsy. She still  needs assistance bathing, cannot drive, cannot transfer alone . She was diagnosed with a hematoma of left iliopsoas during hospital stay 10/2019 , but she had repeat  CT pelvis 01/09/2020 that showed resolution of hematoma. She is back on Eliquis and off aspirin now  Dr. Katrinka Blazing reviewed her case and advised against doing the surgery to decompress the nerve. She continues to have lower extremity edema that improves with massage She went to vascular surgeon and she is wearing compression pumps twice daily , also has leg wraps and was given a wrap for her right thigh however it causes pain and only goes up to above her right knee. Stable   CKI stage III: GFR was down to 38 , she is no longer seeing nephrologist and we will recheck labs next visit    Atherosclerosis of Aorta/Afib/PAD: she is on statin and back on Eliquis Denies side effects Last LDL slightly above goal. Denies palpitation  . She is compliant with visits to cardiologist - Dr. Juliann Pares    Hypothyroidism: on levothyroxine 50 mcg daily, last TSH  was back to normal    Anxiety/dysthymia : she is on lexapro,she has been able to move better around her house, sister moved in with them, seems to be helping her emotionally  Patient Active Problem List   Diagnosis Date Noted   Esophageal dysmotility 07/02/2023   Chronic deep vein thrombosis (DVT) of tibial vein of right lower extremity (HCC) 07/02/2023   Type 2 diabetes mellitus with diabetic neuropathy, without long-term current use of insulin (HCC) 07/02/2023   B12 deficiency 07/02/2023   Other dysphagia 05/13/2023   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) 12/01/2022   Interstitial lung disease (HCC) 12/01/2022   Atherosclerosis of native arteries of extremity with intermittent claudication (HCC) 10/16/2022   Anemia due to chronic kidney disease 08/26/2022   Hypothyroidism (acquired) 01/27/2022   PAD (peripheral artery disease) (HCC) 09/27/2021   AF (paroxysmal atrial fibrillation) (HCC) 09/15/2021   Primary osteoarthritis of right hip 07/18/2021   Atherosclerosis of aorta (HCC) 06/07/2021   Mild protein-calorie malnutrition (HCC)    Hyperbilirubinemia    Injury of left femoral nerve 08/28/2020   Lymphedema 06/25/2020   History of pelvic hematoma 02/10/2020   Chronic venous hypertension due to deep vein thrombosis (DVT) 10/30/2019   Normocytic anemia 08/07/2019   Chronic infection of right knee (HCC) 11/25/2018   Infection and inflammatory reaction due to internal joint prosthesis, subsequent encounter 01/25/2018   Infection of total right knee replacement (HCC) 11/19/2017   Type 2 diabetes mellitus with stage 3b chronic kidney disease, without long-term current use of insulin (HCC) 11/06/2016   Primary osteoarthritis of knee 03/11/2016   Primary osteoarthritis of left hip 12/11/2015   Essential hypertension 04/21/2015   Chronic kidney disease (CKD), stage III (moderate) (HCC) 04/21/2015   Chronic venous insufficiency 04/21/2015   DDD (degenerative disc disease), cervical 04/21/2015   Diabetes mellitus with renal manifestation (HCC) 04/21/2015   Dyslipidemia 04/21/2015   Bladder cystocele 04/21/2015   Urinary incontinence 04/21/2015   Leg varices 04/21/2015   Insomnia 04/21/2015   Eczema intertrigo 04/21/2015   Morbid obesity (HCC) 04/21/2015   OSA (obstructive sleep apnea) 04/21/2015   Osteoarthritis of multiple joints 04/21/2015   H/O Spinal surgery 11/14/2013   Detrusor muscle hypertonia 11/07/2013    Past Surgical History:  Procedure Laterality Date   ABDOMINAL HYSTERECTOMY  1993   Total    BUNIONECTOMY Bilateral 1993   ESOPHAGOGASTRODUODENOSCOPY (EGD) WITH PROPOFOL N/A 05/13/2023   Procedure: ESOPHAGOGASTRODUODENOSCOPY (EGD) WITH PROPOFOL;  Surgeon: [name], MD;  Location: [facility] ENDOSCOPY;  Service: Gastroenterology;  Laterality: N/A;   I & D KNEE WITH POLY EXCHANGE Right 11/19/2017   Procedure: RIGHT KNEE POLY EXCHANGE WITH IRRIGATION AND DEBRIDEMENT;  Surgeon: Kennedy Bucker, MD;  Location: [facility] ORS;  Service: Orthopedics;  Laterality: Right;   IR FLUORO GUIDED NEEDLE PLC ASPIRATION/INJECTION LOC  09/19/2021   JOINT REPLACEMENT     bilateral knee   LAMINECTOMY  11/14/2013    Cervical Fusion , [facility], Dr. Timoteo Ace   LASER ABLATION Bilateral 07/29/2012   Dr. Wyn Quaker   LOWER EXTREMITY ANGIOGRAPHY Right 11/02/2019   Procedure: Lower Extremity Angiography;  Surgeon: Renford Dills, MD;  Location: [facility] INVASIVE CV LAB;  Service: Cardiovascular;  Laterality: Right;   ORIF ANKLE FRACTURE Right 09/02/2019   Procedure: OPEN REDUCTION INTERNAL FIXATION (ORIF) ANKLE FRACTURE BIMALLEOLAR;  Surgeon: Rosetta Posner, DPM;  Location: [facility] ORS;  Service: Podiatry;  Laterality: Right;   PERIPHERAL VASCULAR THROMBECTOMY Right 03/02/2020   Procedure: PERIPHERAL VASCULAR THROMBECTOMY;  Surgeon: Festus Barren  S, MD;  Location: [facility] INVASIVE CV LAB;  Service: Cardiovascular;  Laterality: Right;   REPLACEMENT TOTAL KNEE Left 03/2010   Dr. Rosita Kea   TEE WITHOUT CARDIOVERSION N/A 09/19/2021   Procedure: TRANSESOPHAGEAL ECHOCARDIOGRAM (TEE);  Surgeon: Lamar Blinks, MD;  Location: [facility] ORS;  Service: Cardiovascular;  Laterality: N/A;   TOTAL HIP ARTHROPLASTY Left 12/11/2015   Procedure: TOTAL HIP ARTHROPLASTY ANTERIOR APPROACH;  Surgeon: Kennedy Bucker, MD;  Location: [facility] ORS;  Service: Orthopedics;  Laterality: Left;   TOTAL KNEE ARTHROPLASTY Right 03/11/2016   Procedure: TOTAL KNEE ARTHROPLASTY;  Surgeon: Kennedy Bucker, MD;  Location: [facility] ORS;  Service: Orthopedics;  Laterality: Right;    Family History  Problem  Relation Age of Onset   Diabetes Mother    Hyperlipidemia Mother    Hypertension Mother    Diabetes Father    Hyperlipidemia Father    Hypertension Father    Obesity Father    Hypertension Sister    Hyperlipidemia Sister    Hyperlipidemia Sister    Breast cancer Neg Hx     Social History   Socioeconomic History   Marital status: Married    Spouse name: Navdeep Fessenden   Number of children: 1   Years of education: Not on file   Highest education level: Not on file  Occupational History   Occupation: Retired  Tobacco Use   Smoking status: Never    Passive exposure: Never   Smokeless tobacco: Never  Vaping Use   Vaping status: Never Used  Substance and Sexual Activity   Alcohol use: No    Alcohol/week: 0.0 standard drinks of alcohol   Drug use: No   Sexual activity: Yes    Partners: Male  Other Topics Concern   Not on file  Social History Narrative   Married, one adopted grown child    Social Drivers of Corporate investment banker Strain: Low Risk  (08/06/2023)   Overall Financial Resource Strain (CARDIA)    Difficulty of Paying Living Expenses: Not very hard  Food Insecurity: No Food Insecurity (08/17/2023)   Hunger Vital Sign    Worried About Running Out of Food in the Last Year: Never true    Ran Out of Food in the Last Year: Never true  Transportation Needs: No Transportation Needs (08/17/2023)   PRAPARE - Administrator, Civil Service (Medical): No    Lack of Transportation (Non-Medical): No  Physical Activity: Insufficiently Active (08/06/2023)   Exercise Vital Sign    Days of Exercise per Week: 5 days    Minutes of Exercise per Session: 20 min  Stress: No Stress Concern Present (08/06/2023)   [employer] of Occupational Health - Occupational Stress Questionnaire    Feeling of Stress : Not at all  Social Connections: Moderately Integrated (08/06/2023)   Social Connection and Isolation Panel [NHANES]    Frequency of Communication with  Friends and Family: More than three times a week    Frequency of Social Gatherings with Friends and Family: Once a week    Attends Religious Services: More than 4 times per year    Active Member of [employer] or Organizations: No    Attends Banker Meetings: Never    Marital Status: Married  Catering manager Violence: Not At Risk (08/17/2023)   Humiliation, Afraid, Rape, and Kick questionnaire    Fear of Current or Ex-Partner: No    Emotionally Abused: No    Physically Abused: No    Sexually Abused: No  Current Outpatient Medications:    ACCU-CHEK GUIDE test strip, TEST ONCE A DAY AS DIRECTED, Disp: 50 strip, Rfl: 1   acetaminophen (TYLENOL) 650 MG CR tablet, Take 650-1,300 mg by mouth 2 (two) times daily as needed for pain., Disp: , Rfl:    amiodarone (PACERONE) 200 MG tablet, Take 200 mg by mouth daily., Disp: , Rfl:    amoxicillin (AMOXIL) 500 MG capsule, Take 1 capsule (500 mg total) by mouth 2 (two) times daily., Disp: 60 capsule, Rfl: 12   apixaban (ELIQUIS) 5 MG TABS tablet, Take 1 tablet by mouth 2 (two) times daily., Disp: , Rfl:    atorvastatin (LIPITOR) 40 MG tablet, Take 1 tablet (40 mg total) by mouth every evening., Disp: 90 tablet, Rfl: 1   blood glucose meter kit and supplies, 1 each by Other route daily at 12 noon. Dispense based on patient and insurance preference. Check fsbs once a day, Disp: 1 each, Rfl: 0   Calcium Carbonate-Vitamin D 600-400 MG-UNIT tablet, Take 1 tablet by mouth 2 (two) times daily., Disp: 60 tablet, Rfl: 0   dapagliflozin propanediol (FARXIGA) 10 MG TABS tablet, Take 1 tablet (10 mg total) by mouth daily before breakfast. Patient receives via [employer] Patient Assistance, Disp: 90 tablet, Rfl: 3   escitalopram (LEXAPRO) 5 MG tablet, TAKE 1 TABLET (5 MG TOTAL) BY MOUTH DAILY., Disp: 90 tablet, Rfl: 1   ferrous sulfate 325 (65 FE) MG EC tablet, TAKE 1 TABLET BY MOUTH EVERY DAY, Disp: 90 tablet, Rfl: 3   fluticasone (FLONASE) 50 MCG/ACT nasal  spray, SPRAY 2 SPRAYS INTO EACH NOSTRIL EVERY DAY, Disp: 48 mL, Rfl: 0   hydroquinone 4 % cream, Apply topically at bedtime as needed., Disp: , Rfl:    latanoprost (XALATAN) 0.005 % ophthalmic solution, Place 1 drop into both eyes at bedtime., Disp: , Rfl:    levothyroxine (SYNTHROID) 50 MCG tablet, Take 1 tablet (50 mcg total) by mouth daily. But take two tablets once a week, Disp: 100 tablet, Rfl: 0   Magnesium Oxide 500 MG CAPS, Take 500 mg by mouth daily. , Disp: , Rfl:    metoprolol succinate (TOPROL-XL) 50 MG 24 hr tablet, Take 50 mg by mouth daily., Disp: , Rfl:    omeprazole (PRILOSEC) 40 MG capsule, Take 1 capsule (40 mg total) by mouth daily., Disp: 90 capsule, Rfl: 3   oxybutynin (DITROPAN-XL) 10 MG 24 hr tablet, Take 1 tablet (10 mg total) by mouth daily., Disp: 90 tablet, Rfl: 3   oxyCODONE-acetaminophen (PERCOCET/ROXICET) 5-325 MG tablet, Take 1 tablet by mouth every 4 (four) hours as needed., Disp: , Rfl:    polyethylene glycol (MIRALAX / GLYCOLAX) 17 g packet, Take 17 g by mouth daily. (Patient taking differently: Take 17 g by mouth every other day.), Disp: 14 each, Rfl: 0   potassium chloride (KLOR-CON) 10 MEQ tablet, Take 10 mEq by mouth daily., Disp: , Rfl:    SIMBRINZA 1-0.2 % SUSP, Apply 1 drop to eye 2 (two) times daily., Disp: , Rfl:    sodium chloride (OCEAN) 0.65 % SOLN nasal spray, Place 1 spray into both nostrils 3 (three) times daily., Disp: 30 mL, Rfl: 0   torsemide (DEMADEX) 20 MG tablet, TAKE 1 TABLET BY MOUTH EVERY DAY, Disp: 90 tablet, Rfl: 1   traZODone (DESYREL) 50 MG tablet, TAKE 1/2 TO 1 TABLET (25-50 MG TOTAL) BY MOUTH AT BEDTIME AS NEEDED. FOR SLEEP, Disp: 90 tablet, Rfl: 0   benzonatate (TESSALON) 100 MG capsule, Take 1-2  capsules (100-200 mg total) by mouth 2 (two) times daily as needed. (Patient not taking: Reported on 11/02/2023), Disp: 40 capsule, Rfl: 0  Allergies  Allergen Reactions   Lisinopril Swelling    I personally reviewed active problem list,  medication list, allergies with the patient/caregiver today.   ROS  Ten systems reviewed and is negative except as mentioned in HPI    Objective  Virtual encounter, vitals not obtained.  There is no height or weight on file to calculate BMI.  Physical Exam  Awake, alert and oriented  PHQ2/9:    09/22/2023   12:43 PM 08/06/2023    9:53 AM 07/02/2023    9:43 AM 06/25/2023   11:01 AM 04/15/2023   10:57 AM  Depression screen PHQ 2/9  Decreased Interest 0 0 0 0 0  Down, Depressed, Hopeless 0 0 0 0 0  PHQ - 2 Score 0 0 0 0 0  Altered sleeping 0 0 0  0  Tired, decreased energy 0 0 0  0  Change in appetite 0 0 0  0  Feeling bad or failure about yourself  0 0 0  0  Trouble concentrating 0 0 0  0  Moving slowly or fidgety/restless 0 0 0  0  Suicidal thoughts 0 0 0  0  PHQ-9 Score 0 0 0  0  Difficult doing work/chores Not difficult at all       PHQ-2/9 Result is negative.    Fall Risk:    09/22/2023   12:43 PM 08/17/2023   11:36 AM 08/06/2023    9:56 AM 07/02/2023    9:43 AM 06/25/2023   11:01 AM  Fall Risk   Falls in the past year? 0  0 0 0  Number falls in past yr: 0 0 0    Injury with Fall? 0 0 0    Risk for fall due to : No Fall Risks  No Fall Risks Impaired mobility;Impaired balance/gait;Orthopedic patient No Fall Risks  Follow up Falls prevention discussed;Education provided;Falls evaluation completed  Falls prevention discussed;Falls evaluation completed Falls prevention discussed;Education provided;Falls evaluation completed Falls evaluation completed     Assessment & Plan  1. AF (paroxysmal atrial fibrillation) (HCC) (Primary)  Rate controlled, under the care of Dr. Jeanene Erb  2. Atherosclerosis of aorta (HCC)  On statin therapy   3. Stage 3b chronic kidney disease (HCC)  Recheck labs next visit   4. Morbid obesity (HCC)  Discussed with the patient the risk posed by an increased BMI. Discussed importance of portion control, calorie counting and at least  150 minutes of physical activity weekly. Avoid sweet beverages and drink more water. Eat at least 6 servings of fruit and vegetables daily    5. Type 2 diabetes mellitus with diabetic neuropathy, without long-term current use of insulin (HCC)  Stable on medication, A1C at goal   6. Chronic infection of right knee (HCC)  Seeing ID, on daily PNC  7. PAD (peripheral artery disease) (HCC)  Stable, on statin therapy   8. Chronic deep vein thrombosis (DVT) of tibial vein of right lower extremity (HCC)  Unchanged, sees vascular surgeon  9. Hypothyroidism (acquired)  Last TSH at goal   10. Obesity, diabetes, and hypertension syndrome (HCC)  Unable to do physical activity, taking Marcelline Deist and trying to eat smaller poritons  11. Esophageal dysmotility  No symptoms, taking PPI  12. OSA (obstructive sleep apnea)  Waiting for CPAP supplies    I discussed the assessment and treatment plan with  the patient. The patient was provided an opportunity to ask questions and all were answered. The patient agreed with the plan and demonstrated an understanding of the instructions.  The patient was advised to call back or seek an in-person evaluation if the symptoms worsen or if the condition fails to improve as anticipated.  I provided 25 minutes of non-face-to-face time during this encounter.

## 2023-11-02 NOTE — Telephone Encounter (Signed)
Spoke with pt and per nurse Larey Brick we have not received any paperwork for [facility]. Pt stated [facility] informed her that they are waiting for [place] to respond as they have already sent it. I did advise pt to contact them and have them to resend it. will call them back tomorrow b/c they are closed today.

## 2023-11-04 ENCOUNTER — Ambulatory Visit: Payer: Self-pay | Admitting: Family Medicine

## 2023-11-11 ENCOUNTER — Ambulatory Visit: Payer: Medicare Other | Admitting: Family Medicine

## 2023-11-13 ENCOUNTER — Ambulatory Visit: Payer: Medicare Other | Admitting: Family Medicine

## 2023-12-02 ENCOUNTER — Other Ambulatory Visit (INDEPENDENT_AMBULATORY_CARE_PROVIDER_SITE_OTHER): Payer: Self-pay | Admitting: Vascular Surgery

## 2023-12-02 DIAGNOSIS — I70213 Atherosclerosis of native arteries of extremities with intermittent claudication, bilateral legs: Secondary | ICD-10-CM

## 2023-12-02 DIAGNOSIS — I87099 Postthrombotic syndrome with other complications of unspecified lower extremity: Secondary | ICD-10-CM

## 2023-12-03 ENCOUNTER — Ambulatory Visit (INDEPENDENT_AMBULATORY_CARE_PROVIDER_SITE_OTHER): Payer: Medicare Other | Admitting: Nurse Practitioner

## 2023-12-03 ENCOUNTER — Ambulatory Visit (INDEPENDENT_AMBULATORY_CARE_PROVIDER_SITE_OTHER): Payer: Medicare Other

## 2023-12-03 ENCOUNTER — Encounter (INDEPENDENT_AMBULATORY_CARE_PROVIDER_SITE_OTHER): Payer: Self-pay | Admitting: Nurse Practitioner

## 2023-12-03 VITALS — BP 147/77 | HR 54 | Resp 18 | Ht 71.0 in | Wt 251.0 lb

## 2023-12-03 DIAGNOSIS — I70213 Atherosclerosis of native arteries of extremities with intermittent claudication, bilateral legs: Secondary | ICD-10-CM

## 2023-12-03 DIAGNOSIS — Z95828 Presence of other vascular implants and grafts: Secondary | ICD-10-CM | POA: Diagnosis not present

## 2023-12-03 DIAGNOSIS — I87099 Postthrombotic syndrome with other complications of unspecified lower extremity: Secondary | ICD-10-CM

## 2023-12-03 DIAGNOSIS — I10 Essential (primary) hypertension: Secondary | ICD-10-CM | POA: Diagnosis not present

## 2023-12-03 DIAGNOSIS — I890 Lymphedema, not elsewhere classified: Secondary | ICD-10-CM

## 2023-12-06 NOTE — Progress Notes (Incomplete)
 Subjective:    Patient ID: Becky Trevino, female    DOB: 02-Jun-1946, 78 y.o.   MRN: [id number] Chief Complaint  Patient presents with  . Follow-up    f/u in 1 year with abi and aorta-iliac duplex (IVC)    HPI  Review of Systems  Cardiovascular:  Positive for leg swelling.  Musculoskeletal:  Positive for arthralgias.  All other systems reviewed and are negative.      Objective:   Physical Exam Vitals reviewed.  HENT:     Head: Normocephalic.  Cardiovascular:     Rate and Rhythm: Normal rate.     Pulses:          Dorsalis pedis pulses are detected w/ Doppler on the right side and detected w/ Doppler on the left side.       Posterior tibial pulses are detected w/ Doppler on the right side and detected w/ Doppler on the left side.  Pulmonary:     Effort: Pulmonary effort is normal.  Skin:    General: Skin is warm and dry.  Neurological:     Mental Status: She is alert and oriented to person, place, and time.     Gait: Gait abnormal.     BP (!) 147/77   Pulse (!) 54   Resp 18   Ht 5\' 11"  (1.803 m)   Wt 251 lb (113.9 kg)   BMI 35.01 kg/m   Past Medical History:  Diagnosis Date  . Cervical spondylosis   . Chronic kidney disease    Stage 3  . DDD (degenerative disc disease), cervical    [facility]  . Diabetes mellitus without complication (HCC)   . Diverticulosis   . Hyperlipidemia   . Hypertension   . Intertrigo   . Leg cramps   . Leg varices   . Obesity   . OSA (obstructive sleep apnea)   . Symptomatic menopausal or female climacteric states   . Syncope     Social History   Socioeconomic History  . Marital status: Married    Spouse name: Huberta Tompkins  . Number of children: 1  . Years of education: Not on file  . Highest education level: Not on file  Occupational History  . Occupation: Retired  Tobacco Use  . Smoking status: Never    Passive exposure: Never  . Smokeless tobacco: Never  Vaping Use  . Vaping status: Never Used   Substance and Sexual Activity  . Alcohol use: No    Alcohol/week: 0.0 standard drinks of alcohol  . Drug use: No  . Sexual activity: Yes    Partners: Male  Other Topics Concern  . Not on file  Social History Narrative   Married, one adopted grown child    Social Drivers of Health   Financial Resource Strain: Medium Risk (12/02/2023)   Received from [facility]   Overall Financial Resource Strain (CARDIA)   . Difficulty of Paying Living Expenses: Somewhat hard  Food Insecurity: No Food Insecurity (12/02/2023)   Received from [facility]   Hunger Vital Sign   . Worried About Programme researcher, broadcasting/film/video in the Last Year: Never true   . Ran Out of Food in the Last Year: Never true  Transportation Needs: No Transportation Needs (12/02/2023)   Received from [facility]   [facility] - Transportation   . In the past 12 months, has lack of transportation kept you from medical appointments or from getting medications?: No   .  Lack of Transportation (Non-Medical): No  Physical Activity: Insufficiently Active (08/06/2023)   Exercise Vital Sign   . Days of Exercise per Week: 5 days   . Minutes of Exercise per Session: 20 min  Stress: No Stress Concern Present (08/06/2023)   [employer] of Occupational [employer] - Occupational Stress Questionnaire   . Feeling of Stress : Not at all  Social Connections: Moderately Integrated (08/06/2023)   Social Connection and Isolation Panel [NHANES]   . Frequency of Communication with Friends and Family: More than three times a week   . Frequency of Social Gatherings with Friends and Family: Once a week   . Attends Religious Services: More than 4 times per year   . Active Member of Clubs or Organizations: No   . Attends Banker Meetings: Never   . Marital Status: Married  Catering manager Violence: Not At Risk (08/17/2023)   Humiliation, Afraid, Rape, and Kick questionnaire   . Fear of Current  or Ex-Partner: No   . Emotionally Abused: No   . Physically Abused: No   . Sexually Abused: No    Past Surgical History:  Procedure Laterality Date  . ABDOMINAL HYSTERECTOMY  1993   Total  . BUNIONECTOMY Bilateral 1993  . ESOPHAGOGASTRODUODENOSCOPY (EGD) WITH PROPOFOL N/A 05/13/2023   Procedure: ESOPHAGOGASTRODUODENOSCOPY (EGD) WITH PROPOFOL;  Surgeon: [name], MD;  Location: [facility] ENDOSCOPY;  Service: Gastroenterology;  Laterality: N/A;  . I & D KNEE WITH POLY EXCHANGE Right 11/19/2017   Procedure: RIGHT KNEE POLY EXCHANGE WITH IRRIGATION AND DEBRIDEMENT;  Surgeon: Kennedy Bucker, MD;  Location: [facility] ORS;  Service: Orthopedics;  Laterality: Right;  . IR FLUORO GUIDED NEEDLE PLC ASPIRATION/INJECTION LOC  09/19/2021  . JOINT REPLACEMENT     bilateral knee  . LAMINECTOMY  11/14/2013    Cervical Fusion , [facility], Dr. Timoteo Ace  . LASER ABLATION Bilateral 07/29/2012   Dr. Wyn Quaker  . LOWER EXTREMITY ANGIOGRAPHY Right 11/02/2019   Procedure: Lower Extremity Angiography;  Surgeon: Renford Dills, MD;  Location: [facility] INVASIVE CV LAB;  Service: Cardiovascular;  Laterality: Right;  . ORIF ANKLE FRACTURE Right 09/02/2019   Procedure: OPEN REDUCTION INTERNAL FIXATION (ORIF) ANKLE FRACTURE BIMALLEOLAR;  Surgeon: Rosetta Posner, DPM;  Location: [facility] ORS;  Service: Podiatry;  Laterality: Right;  . PERIPHERAL VASCULAR THROMBECTOMY Right 03/02/2020   Procedure: PERIPHERAL VASCULAR THROMBECTOMY;  Surgeon: Annice Needy, MD;  Location: [facility] INVASIVE CV LAB;  Service: Cardiovascular;  Laterality: Right;  . REPLACEMENT TOTAL KNEE Left 03/2010   Dr. Rosita Kea  . TEE WITHOUT CARDIOVERSION N/A 09/19/2021   Procedure: TRANSESOPHAGEAL ECHOCARDIOGRAM (TEE);  Surgeon: Lamar Blinks, MD;  Location: [facility] ORS;  Service: Cardiovascular;  Laterality: N/A;  . TOTAL HIP ARTHROPLASTY Left 12/11/2015   Procedure: TOTAL HIP ARTHROPLASTY ANTERIOR APPROACH;  Surgeon: Kennedy Bucker, MD;  Location: [facility] ORS;  Service: Orthopedics;  Laterality:  Left;  . TOTAL KNEE ARTHROPLASTY Right 03/11/2016   Procedure: TOTAL KNEE ARTHROPLASTY;  Surgeon: Kennedy Bucker, MD;  Location: [facility] ORS;  Service: Orthopedics;  Laterality: Right;    Family History  Problem Relation Age of Onset  . Diabetes Mother   . Hyperlipidemia Mother   . Hypertension Mother   . Diabetes Father   . Hyperlipidemia Father   . Hypertension Father   . Obesity Father   . Hypertension Sister   . Hyperlipidemia Sister   . Hyperlipidemia Sister   . Breast cancer Neg Hx     Allergies  Allergen Reactions  . Lisinopril Swelling  Latest Ref Rng & Units 06/29/2023    9:59 AM 12/30/2022    9:26 AM 11/07/2022    9:55 AM  CBC  WBC 4.0 - 10.5 K/uL 4.8  4.5  4.5   Hemoglobin 12.0 - 15.0 g/dL 95.6  21.3  08.6   Hematocrit 36.0 - 46.0 % 38.8  40.2  42.1   Platelets 150 - 400 K/uL 179  190  191       CMP     Component Value Date/Time   NA 141 03/02/2023 1030   NA 142 11/26/2015 1122   NA 141 10/18/2012 1716   K 4.6 03/02/2023 1030   K 3.5 10/18/2012 1716   CL 104 03/02/2023 1030   CL 110 (H) 10/18/2012 1716   CO2 32 03/02/2023 1030   CO2 24 10/18/2012 1716   GLUCOSE 75 03/02/2023 1030   GLUCOSE 128 (H) 10/18/2012 1716   BUN 21 03/02/2023 1030   BUN 27 11/26/2015 1122   BUN 31 (H) 10/18/2012 1716   CREATININE 1.43 (H) 03/02/2023 1030   CALCIUM 9.1 03/02/2023 1030   CALCIUM 7.9 (L) 10/18/2012 1716   PROT 7.9 03/02/2023 1030   PROT 7.6 11/26/2015 1122   PROT 8.0 05/22/2012 2206   ALBUMIN 3.4 (L) 11/07/2022 0955   ALBUMIN 4.3 11/26/2015 1122   ALBUMIN 3.4 05/22/2012 2206   AST 20 03/02/2023 1030   AST 34 05/22/2012 2206   ALT 13 03/02/2023 1030   ALT 31 05/22/2012 2206   ALKPHOS 94 11/07/2022 0955   ALKPHOS 115 05/22/2012 2206   BILITOT 0.5 03/02/2023 1030   BILITOT 0.3 11/26/2015 1122   BILITOT 0.3 05/22/2012 2206   EGFR 38 (L) 03/02/2023 1030   GFRNONAA 37 (L) 11/07/2022 0955   GFRNONAA 41 (L) 02/04/2021 1123     No results found.      Assessment & Plan:   1. Lymphedema (Primary) The patient's lymphedema is under fairly decent control.  She is working on obtaining a lymphedema pump that goes up higher to her waist but there is some delay due to some cost issues.  2. Atherosclerosis of native artery of both lower extremities with intermittent claudication (HCC)  Recommend:  The patient has evidence of atherosclerosis of the lower extremities with no claudication.  The patient does not voice lifestyle limiting changes at this point in time.  Noninvasive studies do not suggest clinically significant change.  No invasive studies, angiography or surgery at this time The patient should continue walking and begin a more formal exercise program.  The patient should continue antiplatelet therapy and aggressive treatment of the lipid abnormalities  No changes in the patient's medications at this time  Continued surveillance is indicated as atherosclerosis is likely to progress with time.    The patient will continue follow up with noninvasive studies as ordered.   3. Presence of IVC filter No evidence of thrombus in the iliac vessels with mildly patent IVC.  Will continue to follow annually  4. Benign essential HTN Continue antihypertensive medications as already ordered, these medications have been reviewed and there are no changes at this time.   Current Outpatient Medications on File Prior to Visit  Medication Sig Dispense Refill  . ACCU-CHEK GUIDE test strip TEST ONCE A DAY AS DIRECTED 50 strip 1  . acetaminophen (TYLENOL) 650 MG CR tablet Take 650-1,300 mg by mouth 2 (two) times daily as needed for pain.    Marland Kitchen amiodarone (PACERONE) 200 MG tablet Take 200 mg by mouth daily.    Marland Kitchen  amoxicillin (AMOXIL) 500 MG capsule Take 1 capsule (500 mg total) by mouth 2 (two) times daily. 60 capsule 12  . apixaban (ELIQUIS) 5 MG TABS tablet Take 1 tablet by mouth 2 (two) times daily.    Marland Kitchen atorvastatin (LIPITOR) 40 MG tablet Take 1  tablet (40 mg total) by mouth every evening. 90 tablet 1  . blood glucose meter kit and supplies 1 each by Other route daily at 12 noon. Dispense based on patient and insurance preference. Check fsbs once a day 1 each 0  . Calcium Carbonate-Vitamin D 600-400 MG-UNIT tablet Take 1 tablet by mouth 2 (two) times daily. 60 tablet 0  . dapagliflozin propanediol (FARXIGA) 10 MG TABS tablet Take 1 tablet (10 mg total) by mouth daily before breakfast. Patient receives via [employer] Patient Assistance 90 tablet 3  . escitalopram (LEXAPRO) 5 MG tablet TAKE 1 TABLET (5 MG TOTAL) BY MOUTH DAILY. 90 tablet 1  . ferrous sulfate 325 (65 FE) MG EC tablet TAKE 1 TABLET BY MOUTH EVERY DAY 90 tablet 3  . fluticasone (FLONASE) 50 MCG/ACT nasal spray SPRAY 2 SPRAYS INTO EACH NOSTRIL EVERY DAY 48 mL 0  . hydroquinone 4 % cream Apply topically at bedtime as needed.    . latanoprost (XALATAN) 0.005 % ophthalmic solution Place 1 drop into both eyes at bedtime.    Marland Kitchen levothyroxine (SYNTHROID) 50 MCG tablet Take 1 tablet (50 mcg total) by mouth daily. But take two tablets once a week 100 tablet 0  . Magnesium Oxide 500 MG CAPS Take 500 mg by mouth daily.     . metoprolol succinate (TOPROL-XL) 50 MG 24 hr tablet Take 50 mg by mouth daily.    Marland Kitchen omeprazole (PRILOSEC) 40 MG capsule Take 1 capsule (40 mg total) by mouth daily. 90 capsule 3  . oxybutynin (DITROPAN-XL) 10 MG 24 hr tablet Take 1 tablet (10 mg total) by mouth daily. 90 tablet 3  . oxyCODONE-acetaminophen (PERCOCET/ROXICET) 5-325 MG tablet Take 1 tablet by mouth every 4 (four) hours as needed.    . polyethylene glycol (MIRALAX / GLYCOLAX) 17 g packet Take 17 g by mouth daily. (Patient taking differently: Take 17 g by mouth every other day.) 14 each 0  . potassium chloride (KLOR-CON) 10 MEQ tablet Take 10 mEq by mouth daily.    Marland Kitchen SIMBRINZA 1-0.2 % SUSP Apply 1 drop to eye 2 (two) times daily.    . sodium chloride (OCEAN) 0.65 % SOLN nasal spray Place 1 spray into both  nostrils 3 (three) times daily. 30 mL 0  . torsemide (DEMADEX) 20 MG tablet TAKE 1 TABLET BY MOUTH EVERY DAY 90 tablet 1  . traZODone (DESYREL) 50 MG tablet TAKE 1/2 TO 1 TABLET (25-50 MG TOTAL) BY MOUTH AT BEDTIME AS NEEDED. FOR SLEEP 90 tablet 0   No current facility-administered medications on file prior to visit.    There are no Patient Instructions on file for this visit. No follow-ups on file.   Georgiana Spinner, NP

## 2023-12-06 NOTE — Progress Notes (Signed)
 Subjective:    Patient ID: Becky Trevino, female    DOB: 1946-09-10, 78 y.o.   MRN: [id number] Chief Complaint  Patient presents with   Follow-up    f/u in 1 year with abi and aorta-iliac duplex (IVC)    The patient returns today for her ABIs as well as evaluation of her aortic iliac and IVC.  Today the patient has an ABI of 1.18 on the right and 1.10 on the left.  Previously she had noncompressible ABIs but she also has strong triphasic tibial waveforms bilaterally.  Her aortic iliac duplex showed a patent iliac vessels with no evidence of DVT or thrombus within the IVC.  She continues to utilize her medical grade compression daily.  She denies any new open wounds or ulcerations and her swelling is fairly well-controlled.    Review of Systems  Cardiovascular:  Positive for leg swelling.  Musculoskeletal:  Positive for arthralgias.  All other systems reviewed and are negative.      Objective:   Physical Exam Vitals reviewed.  HENT:     Head: Normocephalic.  Cardiovascular:     Rate and Rhythm: Normal rate.     Pulses:          Dorsalis pedis pulses are detected w/ Doppler on the right side and detected w/ Doppler on the left side.       Posterior tibial pulses are detected w/ Doppler on the right side and detected w/ Doppler on the left side.  Pulmonary:     Effort: Pulmonary effort is normal.  Skin:    General: Skin is warm and dry.  Neurological:     Mental Status: She is alert and oriented to person, place, and time.     Gait: Gait abnormal.     BP (!) 147/77   Pulse (!) 54   Resp 18   Ht 5\' 11"  (1.803 m)   Wt 251 lb (113.9 kg)   BMI 35.01 kg/m   Past Medical History:  Diagnosis Date   Cervical spondylosis    Chronic kidney disease    Stage 3   DDD (degenerative disc disease), cervical    [facility]   Diabetes mellitus without complication (HCC)    Diverticulosis    Hyperlipidemia    Hypertension    Intertrigo    Leg cramps    Leg varices     Obesity    OSA (obstructive sleep apnea)    Symptomatic menopausal or female climacteric states    Syncope     Social History   Socioeconomic History   Marital status: Married    Spouse name: Keely Drennan   Number of children: 1   Years of education: Not on file   Highest education level: Not on file  Occupational History   Occupation: Retired  Tobacco Use   Smoking status: Never    Passive exposure: Never   Smokeless tobacco: Never  Vaping Use   Vaping status: Never Used  Substance and Sexual Activity   Alcohol use: No    Alcohol/week: 0.0 standard drinks of alcohol   Drug use: No   Sexual activity: Yes    Partners: Male  Other Topics Concern   Not on file  Social History Narrative   Married, one adopted grown child    Social Drivers of Health   Financial Resource Strain: Medium Risk (12/02/2023)   Received from [facility]   Overall Financial Resource Strain (CARDIA)    Difficulty of  Paying Living Expenses: Somewhat hard  Food Insecurity: No Food Insecurity (12/02/2023)   Received from [facility]   Hunger Vital Sign    Worried About Running Out of Food in the Last Year: Never true    Ran Out of Food in the Last Year: Never true  Transportation Needs: No Transportation Needs (12/02/2023)   Received from [facility] - Transportation    In the past 12 months, has lack of transportation kept you from medical appointments or from getting medications?: No    Lack of Transportation (Non-Medical): No  Physical Activity: Insufficiently Active (08/06/2023)   Exercise Vital Sign    Days of Exercise per Week: 5 days    Minutes of Exercise per Session: 20 min  Stress: No Stress Concern Present (08/06/2023)   [employer] of Occupational Health - Occupational Stress Questionnaire    Feeling of Stress : Not at all  Social Connections: Moderately Integrated (08/06/2023)   Social Connection and Isolation  Panel [NHANES]    Frequency of Communication with Friends and Family: More than three times a week    Frequency of Social Gatherings with Friends and Family: Once a week    Attends Religious Services: More than 4 times per year    Active Member of [employer] or Organizations: No    Attends Banker Meetings: Never    Marital Status: Married  Catering manager Violence: Not At Risk (08/17/2023)   Humiliation, Afraid, Rape, and Kick questionnaire    Fear of Current or Ex-Partner: No    Emotionally Abused: No    Physically Abused: No    Sexually Abused: No    Past Surgical History:  Procedure Laterality Date   ABDOMINAL HYSTERECTOMY  1993   Total   BUNIONECTOMY Bilateral 1993   ESOPHAGOGASTRODUODENOSCOPY (EGD) WITH PROPOFOL N/A 05/13/2023   Procedure: ESOPHAGOGASTRODUODENOSCOPY (EGD) WITH PROPOFOL;  Surgeon: [name], MD;  Location: [facility] ENDOSCOPY;  Service: Gastroenterology;  Laterality: N/A;   I & D KNEE WITH POLY EXCHANGE Right 11/19/2017   Procedure: RIGHT KNEE POLY EXCHANGE WITH IRRIGATION AND DEBRIDEMENT;  Surgeon: Kennedy Bucker, MD;  Location: [facility] ORS;  Service: Orthopedics;  Laterality: Right;   IR FLUORO GUIDED NEEDLE PLC ASPIRATION/INJECTION LOC  09/19/2021   JOINT REPLACEMENT     bilateral knee   LAMINECTOMY  11/14/2013    Cervical Fusion , [facility], Dr. Timoteo Ace   LASER ABLATION Bilateral 07/29/2012   Dr. Wyn Quaker   LOWER EXTREMITY ANGIOGRAPHY Right 11/02/2019   Procedure: Lower Extremity Angiography;  Surgeon: Renford Dills, MD;  Location: [facility] INVASIVE CV LAB;  Service: Cardiovascular;  Laterality: Right;   ORIF ANKLE FRACTURE Right 09/02/2019   Procedure: OPEN REDUCTION INTERNAL FIXATION (ORIF) ANKLE FRACTURE BIMALLEOLAR;  Surgeon: Rosetta Posner, DPM;  Location: [facility] ORS;  Service: Podiatry;  Laterality: Right;   PERIPHERAL VASCULAR THROMBECTOMY Right 03/02/2020   Procedure: PERIPHERAL VASCULAR THROMBECTOMY;  Surgeon: Annice Needy, MD;  Location: [facility] INVASIVE CV LAB;   Service: Cardiovascular;  Laterality: Right;   REPLACEMENT TOTAL KNEE Left 03/2010   Dr. Rosita Kea   TEE WITHOUT CARDIOVERSION N/A 09/19/2021   Procedure: TRANSESOPHAGEAL ECHOCARDIOGRAM (TEE);  Surgeon: Lamar Blinks, MD;  Location: [facility] ORS;  Service: Cardiovascular;  Laterality: N/A;   TOTAL HIP ARTHROPLASTY Left 12/11/2015   Procedure: TOTAL HIP ARTHROPLASTY ANTERIOR APPROACH;  Surgeon: Kennedy Bucker, MD;  Location: [facility] ORS;  Service: Orthopedics;  Laterality: Left;   TOTAL KNEE ARTHROPLASTY Right 03/11/2016   Procedure: TOTAL  KNEE ARTHROPLASTY;  Surgeon: Kennedy Bucker, MD;  Location: [facility] ORS;  Service: Orthopedics;  Laterality: Right;    Family History  Problem Relation Age of Onset   Diabetes Mother    Hyperlipidemia Mother    Hypertension Mother    Diabetes Father    Hyperlipidemia Father    Hypertension Father    Obesity Father    Hypertension Sister    Hyperlipidemia Sister    Hyperlipidemia Sister    Breast cancer Neg Hx     Allergies  Allergen Reactions   Lisinopril Swelling       Latest Ref Rng & Units 06/29/2023    9:59 AM 12/30/2022    9:26 AM 11/07/2022    9:55 AM  CBC  WBC 4.0 - 10.5 K/uL 4.8  4.5  4.5   Hemoglobin 12.0 - 15.0 g/dL 16.1  09.6  04.5   Hematocrit 36.0 - 46.0 % 38.8  40.2  42.1   Platelets 150 - 400 K/uL 179  190  191       CMP     Component Value Date/Time   NA 141 03/02/2023 1030   NA 142 11/26/2015 1122   NA 141 10/18/2012 1716   K 4.6 03/02/2023 1030   K 3.5 10/18/2012 1716   CL 104 03/02/2023 1030   CL 110 (H) 10/18/2012 1716   CO2 32 03/02/2023 1030   CO2 24 10/18/2012 1716   GLUCOSE 75 03/02/2023 1030   GLUCOSE 128 (H) 10/18/2012 1716   BUN 21 03/02/2023 1030   BUN 27 11/26/2015 1122   BUN 31 (H) 10/18/2012 1716   CREATININE 1.43 (H) 03/02/2023 1030   CALCIUM 9.1 03/02/2023 1030   CALCIUM 7.9 (L) 10/18/2012 1716   PROT 7.9 03/02/2023 1030   PROT 7.6 11/26/2015 1122   PROT 8.0 05/22/2012 2206   ALBUMIN 3.4 (L) 11/07/2022  0955   ALBUMIN 4.3 11/26/2015 1122   ALBUMIN 3.4 05/22/2012 2206   AST 20 03/02/2023 1030   AST 34 05/22/2012 2206   ALT 13 03/02/2023 1030   ALT 31 05/22/2012 2206   ALKPHOS 94 11/07/2022 0955   ALKPHOS 115 05/22/2012 2206   BILITOT 0.5 03/02/2023 1030   BILITOT 0.3 11/26/2015 1122   BILITOT 0.3 05/22/2012 2206   EGFR 38 (L) 03/02/2023 1030   GFRNONAA 37 (L) 11/07/2022 0955   GFRNONAA 41 (L) 02/04/2021 1123     No results found.     Assessment & Plan:   1. Lymphedema (Primary) The patient's lymphedema is under fairly decent control.  She is working on obtaining a lymphedema pump that goes up higher to her waist but there is some delay due to some cost issues.  2. Atherosclerosis of native artery of both lower extremities with intermittent claudication (HCC)  Recommend:  The patient has evidence of atherosclerosis of the lower extremities with no claudication.  The patient does not voice lifestyle limiting changes at this point in time.  Noninvasive studies do not suggest clinically significant change.  No invasive studies, angiography or surgery at this time The patient should continue walking and begin a more formal exercise program.  The patient should continue antiplatelet therapy and aggressive treatment of the lipid abnormalities  No changes in the patient's medications at this time  Continued surveillance is indicated as atherosclerosis is likely to progress with time.    The patient will continue follow up with noninvasive studies as ordered.   3. Presence of IVC filter No evidence of thrombus in the  iliac vessels with mildly patent IVC.  Will continue to follow annually  4. Benign essential HTN Continue antihypertensive medications as already ordered, these medications have been reviewed and there are no changes at this time.   Current Outpatient Medications on File Prior to Visit  Medication Sig Dispense Refill   ACCU-CHEK GUIDE test strip TEST ONCE A DAY  AS DIRECTED 50 strip 1   acetaminophen (TYLENOL) 650 MG CR tablet Take 650-1,300 mg by mouth 2 (two) times daily as needed for pain.     amiodarone (PACERONE) 200 MG tablet Take 200 mg by mouth daily.     amoxicillin (AMOXIL) 500 MG capsule Take 1 capsule (500 mg total) by mouth 2 (two) times daily. 60 capsule 12   apixaban (ELIQUIS) 5 MG TABS tablet Take 1 tablet by mouth 2 (two) times daily.     atorvastatin (LIPITOR) 40 MG tablet Take 1 tablet (40 mg total) by mouth every evening. 90 tablet 1   blood glucose meter kit and supplies 1 each by Other route daily at 12 noon. Dispense based on patient and insurance preference. Check fsbs once a day 1 each 0   Calcium Carbonate-Vitamin D 600-400 MG-UNIT tablet Take 1 tablet by mouth 2 (two) times daily. 60 tablet 0   dapagliflozin propanediol (FARXIGA) 10 MG TABS tablet Take 1 tablet (10 mg total) by mouth daily before breakfast. Patient receives via [employer] Patient Assistance 90 tablet 3   escitalopram (LEXAPRO) 5 MG tablet TAKE 1 TABLET (5 MG TOTAL) BY MOUTH DAILY. 90 tablet 1   ferrous sulfate 325 (65 FE) MG EC tablet TAKE 1 TABLET BY MOUTH EVERY DAY 90 tablet 3   fluticasone (FLONASE) 50 MCG/ACT nasal spray SPRAY 2 SPRAYS INTO EACH NOSTRIL EVERY DAY 48 mL 0   hydroquinone 4 % cream Apply topically at bedtime as needed.     latanoprost (XALATAN) 0.005 % ophthalmic solution Place 1 drop into both eyes at bedtime.     levothyroxine (SYNTHROID) 50 MCG tablet Take 1 tablet (50 mcg total) by mouth daily. But take two tablets once a week 100 tablet 0   Magnesium Oxide 500 MG CAPS Take 500 mg by mouth daily.      metoprolol succinate (TOPROL-XL) 50 MG 24 hr tablet Take 50 mg by mouth daily.     omeprazole (PRILOSEC) 40 MG capsule Take 1 capsule (40 mg total) by mouth daily. 90 capsule 3   oxybutynin (DITROPAN-XL) 10 MG 24 hr tablet Take 1 tablet (10 mg total) by mouth daily. 90 tablet 3   oxyCODONE-acetaminophen (PERCOCET/ROXICET) 5-325 MG tablet Take 1  tablet by mouth every 4 (four) hours as needed.     polyethylene glycol (MIRALAX / GLYCOLAX) 17 g packet Take 17 g by mouth daily. (Patient taking differently: Take 17 g by mouth every other day.) 14 each 0   potassium chloride (KLOR-CON) 10 MEQ tablet Take 10 mEq by mouth daily.     SIMBRINZA 1-0.2 % SUSP Apply 1 drop to eye 2 (two) times daily.     sodium chloride (OCEAN) 0.65 % SOLN nasal spray Place 1 spray into both nostrils 3 (three) times daily. 30 mL 0   torsemide (DEMADEX) 20 MG tablet TAKE 1 TABLET BY MOUTH EVERY DAY 90 tablet 1   traZODone (DESYREL) 50 MG tablet TAKE 1/2 TO 1 TABLET (25-50 MG TOTAL) BY MOUTH AT BEDTIME AS NEEDED. FOR SLEEP 90 tablet 0   No current facility-administered medications on file prior to visit.  There are no Patient Instructions on file for this visit. No follow-ups on file.   Georgiana Spinner, NP

## 2023-12-07 ENCOUNTER — Other Ambulatory Visit: Payer: Self-pay | Admitting: Family Medicine

## 2023-12-07 DIAGNOSIS — E039 Hypothyroidism, unspecified: Secondary | ICD-10-CM

## 2023-12-07 LAB — VAS US ABI WITH/WO TBI
Left ABI: 1.1
Right ABI: 1.18

## 2023-12-08 ENCOUNTER — Telehealth: Payer: Self-pay | Admitting: Family Medicine

## 2023-12-08 NOTE — Telephone Encounter (Signed)
 Sent order to [employer] and they will call patient to discuss further.

## 2023-12-08 NOTE — Telephone Encounter (Signed)
 Ok to order

## 2023-12-08 NOTE — Telephone Encounter (Signed)
 Pt is asking for a order for a hospital bed. She does not like the one she currently have. The hospital bed can lower down further where as the one that she have does not go down that low. Please send to a local medical supply company

## 2023-12-09 ENCOUNTER — Other Ambulatory Visit: Payer: Self-pay | Admitting: Family Medicine

## 2023-12-09 DIAGNOSIS — E785 Hyperlipidemia, unspecified: Secondary | ICD-10-CM

## 2023-12-09 DIAGNOSIS — I700 Atherosclerosis of aorta: Secondary | ICD-10-CM

## 2023-12-10 ENCOUNTER — Other Ambulatory Visit: Payer: Self-pay | Admitting: Oncology

## 2023-12-10 ENCOUNTER — Other Ambulatory Visit: Payer: Self-pay | Admitting: Family Medicine

## 2023-12-10 DIAGNOSIS — R600 Localized edema: Secondary | ICD-10-CM

## 2023-12-10 DIAGNOSIS — F419 Anxiety disorder, unspecified: Secondary | ICD-10-CM

## 2023-12-13 ENCOUNTER — Other Ambulatory Visit: Payer: Self-pay | Admitting: Family Medicine

## 2023-12-13 DIAGNOSIS — R600 Localized edema: Secondary | ICD-10-CM

## 2023-12-21 ENCOUNTER — Telehealth: Payer: Self-pay | Admitting: Family Medicine

## 2023-12-21 NOTE — Telephone Encounter (Signed)
 Pt states that dr Carlynn Purl ordered her a bed she is wanting the ones like in the hospital because they go a little lower that way she can do more for herself. The company is requesting additional information. They want to know why she is needing the bed. Pt is not sure if they will be sending a form over. But please give [employer] a call.

## 2023-12-22 NOTE — Telephone Encounter (Signed)
 Spoke to patient told her I have sent the mos recent office visit notes to medical supply store with the order that I placed the same day. It still being kicked back needing more information, I will re submit the same most recent office visit note but if they still kick it back, pt will need an appointment to have proper documentation for medical equipment. Pt verbalized understanding

## 2023-12-23 NOTE — Telephone Encounter (Signed)
 Pt is requesting a total electric bed. She has spoken with the company and they told her to contact her pcp so they can put the order in

## 2023-12-23 NOTE — Telephone Encounter (Signed)
 Electric bed was ordered

## 2023-12-28 ENCOUNTER — Inpatient Hospital Stay: Payer: Medicare Other | Attending: Oncology

## 2023-12-28 DIAGNOSIS — Z7901 Long term (current) use of anticoagulants: Secondary | ICD-10-CM | POA: Insufficient documentation

## 2023-12-28 DIAGNOSIS — Z79899 Other long term (current) drug therapy: Secondary | ICD-10-CM | POA: Insufficient documentation

## 2023-12-28 DIAGNOSIS — I87099 Postthrombotic syndrome with other complications of unspecified lower extremity: Secondary | ICD-10-CM

## 2023-12-28 DIAGNOSIS — I480 Paroxysmal atrial fibrillation: Secondary | ICD-10-CM | POA: Insufficient documentation

## 2023-12-28 DIAGNOSIS — Z95828 Presence of other vascular implants and grafts: Secondary | ICD-10-CM | POA: Insufficient documentation

## 2023-12-28 DIAGNOSIS — N183 Chronic kidney disease, stage 3 unspecified: Secondary | ICD-10-CM | POA: Insufficient documentation

## 2023-12-28 DIAGNOSIS — D631 Anemia in chronic kidney disease: Secondary | ICD-10-CM | POA: Insufficient documentation

## 2023-12-28 DIAGNOSIS — Z86718 Personal history of other venous thrombosis and embolism: Secondary | ICD-10-CM | POA: Insufficient documentation

## 2023-12-28 LAB — CBC WITH DIFFERENTIAL (CANCER CENTER ONLY)
Abs Immature Granulocytes: 0.06 10*3/uL (ref 0.00–0.07)
Basophils Absolute: 0 10*3/uL (ref 0.0–0.1)
Basophils Relative: 0 %
Eosinophils Absolute: 0.1 10*3/uL (ref 0.0–0.5)
Eosinophils Relative: 2 %
HCT: 38.4 % (ref 36.0–46.0)
Hemoglobin: 12.1 g/dL (ref 12.0–15.0)
Immature Granulocytes: 1 %
Lymphocytes Relative: 20 %
Lymphs Abs: 1 10*3/uL (ref 0.7–4.0)
MCH: 31.6 pg (ref 26.0–34.0)
MCHC: 31.5 g/dL (ref 30.0–36.0)
MCV: 100.3 fL — ABNORMAL HIGH (ref 80.0–100.0)
Monocytes Absolute: 0.4 10*3/uL (ref 0.1–1.0)
Monocytes Relative: 9 %
Neutro Abs: 3.3 10*3/uL (ref 1.7–7.7)
Neutrophils Relative %: 68 %
Platelet Count: 192 10*3/uL (ref 150–400)
RBC: 3.83 MIL/uL — ABNORMAL LOW (ref 3.87–5.11)
RDW: 16.6 % — ABNORMAL HIGH (ref 11.5–15.5)
WBC Count: 4.8 10*3/uL (ref 4.0–10.5)
nRBC: 0 % (ref 0.0–0.2)

## 2023-12-28 LAB — IRON AND TIBC
Iron: 47 ug/dL (ref 28–170)
Saturation Ratios: 20 % (ref 10.4–31.8)
TIBC: 237 ug/dL — ABNORMAL LOW (ref 250–450)
UIBC: 190 ug/dL

## 2023-12-28 LAB — FERRITIN: Ferritin: 91 ng/mL (ref 11–307)

## 2023-12-28 LAB — RETIC PANEL
Immature Retic Fract: 15.5 % (ref 2.3–15.9)
RBC.: 3.8 MIL/uL — ABNORMAL LOW (ref 3.87–5.11)
Retic Count, Absolute: 64.2 10*3/uL (ref 19.0–186.0)
Retic Ct Pct: 1.7 % (ref 0.4–3.1)
Reticulocyte Hemoglobin: 30.5 pg (ref >27.9)

## 2023-12-30 ENCOUNTER — Encounter: Payer: Self-pay | Admitting: Oncology

## 2023-12-30 ENCOUNTER — Inpatient Hospital Stay: Payer: Medicare Other | Admitting: Oncology

## 2023-12-30 VITALS — BP 116/62 | HR 56 | Temp 96.0°F | Resp 18

## 2023-12-30 DIAGNOSIS — N189 Chronic kidney disease, unspecified: Secondary | ICD-10-CM

## 2023-12-30 DIAGNOSIS — N183 Chronic kidney disease, stage 3 unspecified: Secondary | ICD-10-CM | POA: Diagnosis not present

## 2023-12-30 DIAGNOSIS — D631 Anemia in chronic kidney disease: Secondary | ICD-10-CM | POA: Diagnosis not present

## 2023-12-30 DIAGNOSIS — Z86718 Personal history of other venous thrombosis and embolism: Secondary | ICD-10-CM

## 2023-12-30 DIAGNOSIS — N1832 Chronic kidney disease, stage 3b: Secondary | ICD-10-CM | POA: Diagnosis not present

## 2023-12-30 NOTE — Assessment & Plan Note (Signed)
Patient has IVC filter. Continue chronic anticoagulation with Eliquis 5 mg twice daily.- she also has paroxysmal A-Fib

## 2023-12-30 NOTE — Assessment & Plan Note (Signed)
 Encourage oral hydration and avoid nephrotoxins.  No M protein on SPEP and light chain ratio slightly elevated, non specific.

## 2023-12-30 NOTE — Progress Notes (Signed)
 Hematology/Oncology Progress note [phone] [phone]      REASON FOR VISIT  follow-up for DVT. Anemia due to CKD  ASSESSMENT & PLAN:   Anemia due to chronic kidney disease Labs are reviewed and discussed with patient. Lab Results  Component Value Date   HGB 12.1 12/28/2023   TIBC 237 (L) 12/28/2023   IRONPCTSAT 20 12/28/2023   FERRITIN 91 12/28/2023   Hb and iron panel are stable. No need for IV venofer or erythropoitein Continue oral iron supplementation ferrous sulfate 325 mg daily.  Chronic kidney disease (CKD), stage III (moderate) (HCC) Encourage oral hydration and avoid nephrotoxins.  No M protein on SPEP and light chain ratio slightly elevated, non specific.   History of deep vein thrombosis Patient has IVC filter. Continue chronic anticoagulation with Eliquis 5 mg twice daily.- she also has paroxysmal A-Fib   Orders Placed This Encounter  Procedures   CBC with Differential (Cancer Center Only)    Standing Status:   Future    Expected Date:   06/30/2024    Expiration Date:   12/29/2024   Iron and TIBC    Standing Status:   Future    Expected Date:   06/30/2024    Expiration Date:   12/29/2024   Ferritin    Standing Status:   Future    Expected Date:   06/30/2024    Expiration Date:   12/29/2024   Follow up in 6 months.  All questions were answered. The patient knows to call the clinic with any problems, questions or concerns.  Becky Patience, MD, PhD [facility] Hematology Oncology 12/30/2023    Pertinent hematology history 78 y.o. female with past medical history listed as below presents for follow-up of DVT. She was admitted from 10/27/2019-11/07/2019 for right lower extremity cellulitis/wound infection, bacteremia with group B streptococcus, right lower extremity DVT. # Patient had ankle fracture status post a recent right ankle ORIF on 09/02/2019 by Dr. Joylene Grapes.  Patient has a wound VAC She was noticed to have right lower extremity  swelling, mild erythema and warmth with associated tenderness. 11/24/2019 US venous right lower extremity showed a DVT within the right popliteal vein, with superficial thrombus seen at the saphenofemoral junction. Patient tells me that she was diagnosed with "blood clots" in August 2020.  At that time she had pain and swelling for about a week and went to ER for evaluation.    04/29/2019 ultrasound was negative for DVT, only showed a superficial thrombophlebitis involving a branch of the great saphenous vein in the proximal thigh.  She was fine to have right Baker's cyst.  Patient denies any immobilization factors contributing to this event.  Patient had elevated D-dimer at that time. Patient was started on Xarelto-starter kit by emergency room.  Patient was recommended to have US aorta iliac Doppler by primary care provider Dr. Carlynn Purl and I do not see the study was done.  Xarelto was continued outpatient for thrombophlebitis..  Patient was hospitalized again at the end of November 2020 due to syncope episodes which likely due to orthostatic hypotension.  Patient was noted to have anemia, CT abdomen pelvis to evaluate after trauma, showed left gluteal hematoma without signs of extravasation.  Xarelto was held during the hospitalization.  Saw cardiology Dr.Callwood.  Patient was seen by Dr. Carlynn Purl on 08/23/2019 and the Xarelto was stopped.   09/02/2019 patient slipped and obtained right ankle fracture.  Status post ORIF procedure by Dr. Excell Seltzer.  Postop patient was recommended to  start on Lovenox 40 mg daily injections prophylactically until her presentation to emergency room in February.  At that point, she was found to have a provoked right lower extremity DVT and she was seen by me during that admission.  Patient was recommended to start therapeutic Lovenox.  During the same hospitalization, patient developed spontaneous iliopsoas hematoma and therapeutic Lovenox was discontinued. Patient had IVC filter placed  on 11/02/2019.  Patient was recommended to have outpatient follow-up with me. Patient was recently seen by Dr. Ava Lei and was found to have worsening of right lower extremity swelling, Doppler ultrasound was obtained on 02/03/2020 which was positive for DVT in the right lower extremity, increased clot burden in the right lower extremity since 10/27/2019.  Thrombus extending from the right common femoral vein to the right calf.  Patient was referred back to me for evaluation and management.  Currently off anticoagulation due to recurrent hematoma.  IVC filter was placed.  No plan for filter retrieval due to risk of recurrent lower extremity thrombosis. Negative antiphospholipid syndrome antibodies, prothrombin gene mutation, factor V Leiden mutation.  INTERVAL HISTORY Becky Trevino is a 78 y.o. female who has above history reviewed by me today presents for follow up visit for DVT Patient was last seen by me in 2021.  Patient present to reestablish care. Patient has chronic lower extremity DVT, on chronic anticoagulation.  Patient also has IVC filter. Eliquis 5 mg twice daily. Denies bleeding events . She takes oral iron supplementation.  Chronic fatigue has improved.   Review of Systems  Constitutional:  Positive for fatigue. Negative for appetite change, chills and fever.  HENT:   Negative for hearing loss and voice change.   Eyes:  Negative for eye problems.  Respiratory:  Negative for chest tightness and cough.   Cardiovascular:  Positive for leg swelling. Negative for chest pain.       Hip/thigh swelling  Gastrointestinal:  Negative for abdominal distention, abdominal pain and blood in stool.  Endocrine: Negative for hot flashes.  Genitourinary:  Negative for difficulty urinating, frequency and nocturia.   Musculoskeletal:  Negative for arthralgias.  Skin:  Negative for itching and rash.  Neurological:  Negative for extremity weakness.  Hematological:  Negative for adenopathy.   Psychiatric/Behavioral:  Negative for confusion.       Allergies  Allergen Reactions   Lisinopril Swelling     Past Medical History:  Diagnosis Date   Cervical spondylosis    Chronic kidney disease    Stage 3   DDD (degenerative disc disease), cervical    [facility]   Diabetes mellitus without complication (HCC)    Diverticulosis    Hyperlipidemia    Hypertension    Intertrigo    Leg cramps    Leg varices    Obesity    OSA (obstructive sleep apnea)    Symptomatic menopausal or female climacteric states    Syncope      Past Surgical History:  Procedure Laterality Date   ABDOMINAL HYSTERECTOMY  1993   Total   BUNIONECTOMY Bilateral 1993   ESOPHAGOGASTRODUODENOSCOPY (EGD) WITH PROPOFOL N/A 05/13/2023   Procedure: ESOPHAGOGASTRODUODENOSCOPY (EGD) WITH PROPOFOL;  Surgeon: Luke Salaam, MD;  Location: [facility] ENDOSCOPY;  Service: Gastroenterology;  Laterality: N/A;   I & D KNEE WITH POLY EXCHANGE Right 11/19/2017   Procedure: RIGHT KNEE POLY EXCHANGE WITH IRRIGATION AND DEBRIDEMENT;  Surgeon: Molli Angelucci, MD;  Location: [facility] ORS;  Service: Orthopedics;  Laterality: Right;   IR FLUORO GUIDED NEEDLE PLC ASPIRATION/INJECTION  LOC  09/19/2021   JOINT REPLACEMENT     bilateral knee   LAMINECTOMY  11/14/2013    Cervical Fusion , [facility], Dr. Timoteo Ace   LASER ABLATION Bilateral 07/29/2012   Dr. Wyn Quaker   LOWER EXTREMITY ANGIOGRAPHY Right 11/02/2019   Procedure: Lower Extremity Angiography;  Surgeon: Renford Dills, MD;  Location: [facility] INVASIVE CV LAB;  Service: Cardiovascular;  Laterality: Right;   ORIF ANKLE FRACTURE Right 09/02/2019   Procedure: OPEN REDUCTION INTERNAL FIXATION (ORIF) ANKLE FRACTURE BIMALLEOLAR;  Surgeon: Rosetta Posner, DPM;  Location: [facility] ORS;  Service: Podiatry;  Laterality: Right;   PERIPHERAL VASCULAR THROMBECTOMY Right 03/02/2020   Procedure: PERIPHERAL VASCULAR THROMBECTOMY;  Surgeon: Annice Needy, MD;  Location: [facility] INVASIVE CV LAB;  Service: Cardiovascular;   Laterality: Right;   REPLACEMENT TOTAL KNEE Left 03/2010   Dr. Rosita Kea   TEE WITHOUT CARDIOVERSION N/A 09/19/2021   Procedure: TRANSESOPHAGEAL ECHOCARDIOGRAM (TEE);  Surgeon: Lamar Blinks, MD;  Location: [facility] ORS;  Service: Cardiovascular;  Laterality: N/A;   TOTAL HIP ARTHROPLASTY Left 12/11/2015   Procedure: TOTAL HIP ARTHROPLASTY ANTERIOR APPROACH;  Surgeon: Kennedy Bucker, MD;  Location: [facility] ORS;  Service: Orthopedics;  Laterality: Left;   TOTAL KNEE ARTHROPLASTY Right 03/11/2016   Procedure: TOTAL KNEE ARTHROPLASTY;  Surgeon: Kennedy Bucker, MD;  Location: [facility] ORS;  Service: Orthopedics;  Laterality: Right;    Social History   Socioeconomic History   Marital status: Married    Spouse name: Ziare Orrick   Number of children: 1   Years of education: Not on file   Highest education level: Not on file  Occupational History   Occupation: Retired  Tobacco Use   Smoking status: Never    Passive exposure: Never   Smokeless tobacco: Never  Vaping Use   Vaping status: Never Used  Substance and Sexual Activity   Alcohol use: No    Alcohol/week: 0.0 standard drinks of alcohol   Drug use: No   Sexual activity: Yes    Partners: Male  Other Topics Concern   Not on file  Social History Narrative   Married, one adopted grown child    Social Drivers of Health   Financial Resource Strain: Medium Risk (12/02/2023)   Received [facility]   Overall Financial Resource Strain (CARDIA)    Difficulty of Paying Living Expenses: Somewhat hard  Food Insecurity: No Food Insecurity (12/02/2023)   Received from [facility]   Hunger Vital Sign    Worried About Running Out of Food in the Last Year: Never true    Ran Out of Food in the Last Year: Never true  Transportation Needs: No Transportation Needs (12/02/2023)   Received from [facility] - Transportation    In the past 12 months, has lack of transportation kept you from  medical appointments or from getting medications?: No    Lack of Transportation (Non-Medical): No  Physical Activity: Insufficiently Active (08/06/2023)   Exercise Vital Sign    Days of Exercise per Week: 5 days    Minutes of Exercise per Session: 20 min  Stress: No Stress Concern Present (08/06/2023)   [employer] of Occupational Health - Occupational Stress Questionnaire    Feeling of Stress : Not at all  Social Connections: Moderately Integrated (08/06/2023)   Social Connection and Isolation Panel [NHANES]    Frequency of Communication with Friends and Family: More than three times a week    Frequency of Social Gatherings with Friends  and Family: Once a week    Attends Religious Services: More than 4 times per year    Active Member of Clubs or Organizations: No    Attends Banker Meetings: Never    Marital Status: Married  Catering manager Violence: Not At Risk (08/17/2023)   Humiliation, Afraid, Rape, and Kick questionnaire    Fear of Current or Ex-Partner: No    Emotionally Abused: No    Physically Abused: No    Sexually Abused: No    Family History  Problem Relation Age of Onset   Diabetes Mother    Hyperlipidemia Mother    Hypertension Mother    Diabetes Father    Hyperlipidemia Father    Hypertension Father    Obesity Father    Hypertension Sister    Hyperlipidemia Sister    Hyperlipidemia Sister    Breast cancer Neg Hx      Current Outpatient Medications:    ACCU-CHEK GUIDE test strip, TEST ONCE A DAY AS DIRECTED, Disp: 50 strip, Rfl: 1   acetaminophen (TYLENOL) 650 MG CR tablet, Take 650-1,300 mg by mouth 2 (two) times daily as needed for pain., Disp: , Rfl:    amiodarone (PACERONE) 200 MG tablet, Take 200 mg by mouth daily., Disp: , Rfl:    amoxicillin (AMOXIL) 500 MG capsule, Take 1 capsule (500 mg total) by mouth 2 (two) times daily., Disp: 60 capsule, Rfl: 12   apixaban (ELIQUIS) 5 MG TABS tablet, Take 1 tablet by mouth 2 (two) times  daily., Disp: , Rfl:    atorvastatin (LIPITOR) 40 MG tablet, TAKE 1 TABLET BY MOUTH EVERY DAY IN THE EVENING, Disp: 90 tablet, Rfl: 0   blood glucose meter kit and supplies, 1 each by Other route daily at 12 noon. Dispense based on patient and insurance preference. Check fsbs once a day, Disp: 1 each, Rfl: 0   Calcium Carbonate-Vitamin D 600-400 MG-UNIT tablet, Take 1 tablet by mouth 2 (two) times daily., Disp: 60 tablet, Rfl: 0   dapagliflozin propanediol (FARXIGA) 10 MG TABS tablet, Take 1 tablet (10 mg total) by mouth daily before breakfast. Patient receives via [place] Patient Assistance, Disp: 90 tablet, Rfl: 3   escitalopram (LEXAPRO) 5 MG tablet, TAKE 1 TABLET (5 MG TOTAL) BY MOUTH DAILY., Disp: 90 tablet, Rfl: 0   ferrous sulfate 325 (65 FE) MG EC tablet, TAKE 1 TABLET BY MOUTH EVERY DAY, Disp: 90 tablet, Rfl: 3   fluticasone (FLONASE) 50 MCG/ACT nasal spray, SPRAY 2 SPRAYS INTO EACH NOSTRIL EVERY DAY, Disp: 48 mL, Rfl: 0   hydroquinone 4 % cream, Apply topically at bedtime as needed., Disp: , Rfl:    latanoprost (XALATAN) 0.005 % ophthalmic solution, Place 1 drop into both eyes at bedtime., Disp: , Rfl:    levothyroxine (SYNTHROID) 50 MCG tablet, Take 1 tablet (50 mcg total) by mouth daily. But take two tablets once a week, Disp: 100 tablet, Rfl: 0   Magnesium Oxide 500 MG CAPS, Take 500 mg by mouth daily. , Disp: , Rfl:    metoprolol succinate (TOPROL-XL) 50 MG 24 hr tablet, Take 50 mg by mouth daily., Disp: , Rfl:    omeprazole (PRILOSEC) 40 MG capsule, Take 1 capsule (40 mg total) by mouth daily., Disp: 90 capsule, Rfl: 3   oxybutynin (DITROPAN-XL) 10 MG 24 hr tablet, Take 1 tablet (10 mg total) by mouth daily., Disp: 90 tablet, Rfl: 3   oxyCODONE-acetaminophen (PERCOCET/ROXICET) 5-325 MG tablet, Take 1 tablet by mouth every 4 (four)  hours as needed., Disp: , Rfl:    polyethylene glycol (MIRALAX / GLYCOLAX) 17 g packet, Take 17 g by mouth daily. (Patient taking differently: Take 17 g by  mouth every other day.), Disp: 14 each, Rfl: 0   potassium chloride (KLOR-CON) 10 MEQ tablet, Take 10 mEq by mouth daily., Disp: , Rfl:    SIMBRINZA 1-0.2 % SUSP, Apply 1 drop to eye 2 (two) times daily., Disp: , Rfl:    sodium chloride (OCEAN) 0.65 % SOLN nasal spray, Place 1 spray into both nostrils 3 (three) times daily., Disp: 30 mL, Rfl: 0   torsemide (DEMADEX) 20 MG tablet, TAKE 1 TABLET BY MOUTH EVERY DAY, Disp: 90 tablet, Rfl: 0   traZODone (DESYREL) 50 MG tablet, TAKE 1/2 TO 1 TABLET (25-50 MG TOTAL) BY MOUTH AT BEDTIME AS NEEDED. FOR SLEEP, Disp: 90 tablet, Rfl: 0  Physical exam:  Vitals:   12/30/23 1004  BP: 116/62  Pulse: (!) 56  Resp: 18  Temp: (!) 96 F (35.6 C)  TempSrc: Tympanic  SpO2: 100%   Physical Exam Constitutional:      General: She is not in acute distress.    Appearance: She is obese.     Comments: Patient sits in the wheelchair  HENT:     Head: Normocephalic and atraumatic.  Eyes:     General: No scleral icterus. Cardiovascular:     Rate and Rhythm: Normal rate and regular rhythm.     Heart sounds: Normal heart sounds.  Pulmonary:     Effort: Pulmonary effort is normal. No respiratory distress.     Breath sounds: No wheezing.  Abdominal:     General: Bowel sounds are normal. There is no distension.     Palpations: Abdomen is soft.  Musculoskeletal:        General: Swelling present. No deformity. Normal range of motion.     Cervical back: Normal range of motion and neck supple.     Comments: Bilateral lower extremity swelling  Skin:    General: Skin is warm and dry.     Findings: No erythema or rash.  Neurological:     Mental Status: She is alert and oriented to person, place, and time. Mental status is at baseline.     Cranial Nerves: No cranial nerve deficit.     Coordination: Coordination normal.  Psychiatric:        Mood and Affect: Mood normal.        Latest Ref Rng & Units 03/02/2023   10:30 AM  CMP  Glucose 65 - 99 mg/dL 75   BUN  7 - 25 mg/dL 21   Creatinine 0.98 - 1.00 mg/dL 1.19   Sodium 147 - 829 mmol/L 141   Potassium 3.5 - 5.3 mmol/L 4.6   Chloride 98 - 110 mmol/L 104   CO2 20 - 32 mmol/L 32   Calcium 8.6 - 10.4 mg/dL 9.1   Total Protein 6.1 - 8.1 g/dL 7.9   Total Bilirubin 0.2 - 1.2 mg/dL 0.5   AST 10 - 35 U/L 20   ALT 6 - 29 U/L 13       Latest Ref Rng & Units 12/28/2023   10:29 AM  CBC  WBC 4.0 - 10.5 K/uL 4.8   Hemoglobin 12.0 - 15.0 g/dL 56.2   Hematocrit 13.0 - 46.0 % 38.4   Platelets 150 - 400 K/uL 192     RADIOGRAPHIC STUDIES: I have personally reviewed the radiological images as listed and agreed with  the findings in the report. VAS [place] ABI WITH/WO TBI Result Date: 12/07/2023  LOWER EXTREMITY DOPPLER STUDY Patient Name:  Becky Trevino  Date of Exam:   12/03/2023 Medical Rec #: [id number]        Accession #:    [id number] Date of Birth: 01-10-1946        Patient Gender: F Patient Age:   78 years Exam Location:  [facility] Procedure:      VAS [place] ABI WITH/WO TBI Referring Phys: GREGORY SCHNIER --------------------------------------------------------------------------------  High Risk Factors: Diabetes.  Vascular Interventions: IVC filter. Performing Technologist: Hardie Lora RVT  Examination Guidelines: A complete evaluation includes at minimum, Doppler waveform signals and systolic blood pressure reading at the level of bilateral brachial, anterior tibial, and posterior tibial arteries, when vessel segments are accessible. Bilateral testing is considered an integral part of a complete examination. Photoelectric Plethysmograph (PPG) waveforms and toe systolic pressure readings are included as required and additional duplex testing as needed. Limited examinations for reoccurring indications may be performed as noted.  ABI Findings: +---------+------------------+-----+---------+--------+ Right    Rt Pressure (mmHg)IndexWaveform Comment   +---------+------------------+-----+---------+--------+ Brachial 143                                      +---------+------------------+-----+---------+--------+ PTA      121               0.85 triphasic         +---------+------------------+-----+---------+--------+ DP       169               1.18 triphasic         +---------+------------------+-----+---------+--------+ Great Toe118               0.83                   +---------+------------------+-----+---------+--------+ +---------+------------------+-----+---------+-------+ Left     Lt Pressure (mmHg)IndexWaveform Comment +---------+------------------+-----+---------+-------+ Brachial 138                                     +---------+------------------+-----+---------+-------+ PTA      155               1.08 triphasic        +---------+------------------+-----+---------+-------+ DP       157               1.10 triphasic        +---------+------------------+-----+---------+-------+ Great Toe125               0.87                  +---------+------------------+-----+---------+-------+ +-------+-----------+-----------+----------------+------------+ ABI/TBIToday's ABIToday's TBIPrevious ABI    Previous TBI +-------+-----------+-----------+----------------+------------+ Right  1.18       0.83       Non compressible1.05         +-------+-----------+-----------+----------------+------------+ Left   1.10       0.87       Non compressible0.98         +-------+-----------+-----------+----------------+------------+  Bilateral ABIs appear essentially unchanged compared to prior study on 10/13/2022.  Summary: Right: Resting right ankle-brachial index is within normal range. The right toe-brachial index is normal. Left: Resting left ankle-brachial index is within normal range. The left toe-brachial index is normal. *See table(s) above for measurements and  observations.  Electronically signed by Devon Fogo MD on 12/07/2023 at 7:39:08 AM.    Final    VAS US  IVC/ILIAC (VENOUS ONLY) Result Date: 12/07/2023 IVC/ILIAC STUDY Patient Name:  Becky Trevino  Date of Exam:   12/03/2023 Medical Rec #: [id number]        Accession #:    [id number] Date of Birth: 1946-05-02        Patient Gender: F Patient Age:   63 years Exam Location:  [facility] Procedure:      VAS US  IVC/ILIAC (VENOUS ONLY) Referring Phys: Devon Fogo --------------------------------------------------------------------------------  Indications: Surgery date 11/02/2019. Risk Factors: Diabetes, no history of smoking. Other Factors: Bilateral lower extremity lymphedema. Vascular Interventions: Infra renal IVC filter 11/02/2019. Limitations: Obesity and air/bowel gas.  Performing Technologist: Oneta Bilberry RVT  Examination Guidelines: A complete evaluation includes B-mode imaging, spectral Doppler, color Doppler, and power Doppler as needed of all accessible portions of each vessel. Bilateral testing is considered an integral part of a complete examination. Limited examinations for reoccurring indications may be performed as noted.  IVC/Iliac Findings: +----------+------+--------+-----------------------------------+    IVC    PatentThrombus             Comments               +----------+------+--------+-----------------------------------+ IVC Prox  patent                                            +----------+------+--------+-----------------------------------+ IVC Mid   patent        Filter was not directly visualized. +----------+------+--------+-----------------------------------+ IVC Distalpatent                                            +----------+------+--------+-----------------------------------+  +-----------------+---------+-----------+---------+-----------+--------+        CIV       RT-PatentRT-ThrombusLT-PatentLT-ThrombusComments  +-----------------+---------+-----------+---------+-----------+--------+ Common Iliac Prox patent              patent                      +-----------------+---------+-----------+---------+-----------+--------+    Summary: IVC/Iliac: No evidence of thrombus in IVC and Iliac veins.  *See table(s) above for measurements and observations.  Electronically signed by Devon Fogo MD on 12/07/2023 at 7:37:49 AM.    Final

## 2023-12-30 NOTE — Assessment & Plan Note (Addendum)
 Labs are reviewed and discussed with patient. Lab Results  Component Value Date   HGB 12.1 12/28/2023   TIBC 237 (L) 12/28/2023   IRONPCTSAT 20 12/28/2023   FERRITIN 91 12/28/2023   Hb and iron panel are stable. No need for IV venofer or erythropoitein Continue oral iron supplementation ferrous sulfate 325 mg daily.

## 2024-01-15 ENCOUNTER — Ambulatory Visit

## 2024-01-15 DIAGNOSIS — Z23 Encounter for immunization: Secondary | ICD-10-CM

## 2024-01-15 DIAGNOSIS — Z719 Counseling, unspecified: Secondary | ICD-10-CM

## 2024-01-15 NOTE — Progress Notes (Signed)
 Homevisit to administer 2nd covid vaccine per standing order and pt's desire for vaccine.  Pt over 78 y/o.  Vaccine screening and administration record completed; signed by pt. (Sent for scanning).   VIS given.  Spikevax 12+ 2024-25 administered by Moishe Angel RN; tolerated well.  Post vaccination instructions reviewed.  [facility] updated and copy mailed to pt.    Macon Scala, RN

## 2024-01-28 ENCOUNTER — Ambulatory Visit (INDEPENDENT_AMBULATORY_CARE_PROVIDER_SITE_OTHER): Payer: Medicare Other | Admitting: Nurse Practitioner

## 2024-01-29 ENCOUNTER — Other Ambulatory Visit: Payer: Self-pay | Admitting: Family Medicine

## 2024-01-29 DIAGNOSIS — E039 Hypothyroidism, unspecified: Secondary | ICD-10-CM

## 2024-02-02 ENCOUNTER — Encounter (INDEPENDENT_AMBULATORY_CARE_PROVIDER_SITE_OTHER): Payer: Self-pay

## 2024-02-12 LAB — HM DIABETES FOOT EXAM

## 2024-02-26 ENCOUNTER — Other Ambulatory Visit: Payer: Self-pay | Admitting: Family Medicine

## 2024-02-26 DIAGNOSIS — G4709 Other insomnia: Secondary | ICD-10-CM

## 2024-03-02 ENCOUNTER — Ambulatory Visit: Payer: Medicare Other | Admitting: Family Medicine

## 2024-03-02 ENCOUNTER — Encounter: Payer: Self-pay | Admitting: Family Medicine

## 2024-03-02 VITALS — BP 104/66 | HR 55 | Resp 16 | Ht 71.0 in | Wt 255.4 lb

## 2024-03-02 DIAGNOSIS — E1140 Type 2 diabetes mellitus with diabetic neuropathy, unspecified: Secondary | ICD-10-CM | POA: Diagnosis not present

## 2024-03-02 DIAGNOSIS — I82541 Chronic embolism and thrombosis of right tibial vein: Secondary | ICD-10-CM

## 2024-03-02 DIAGNOSIS — G4733 Obstructive sleep apnea (adult) (pediatric): Secondary | ICD-10-CM

## 2024-03-02 DIAGNOSIS — I480 Paroxysmal atrial fibrillation: Secondary | ICD-10-CM

## 2024-03-02 DIAGNOSIS — E039 Hypothyroidism, unspecified: Secondary | ICD-10-CM

## 2024-03-02 DIAGNOSIS — I739 Peripheral vascular disease, unspecified: Secondary | ICD-10-CM

## 2024-03-02 DIAGNOSIS — N1832 Chronic kidney disease, stage 3b: Secondary | ICD-10-CM

## 2024-03-02 DIAGNOSIS — K224 Dyskinesia of esophagus: Secondary | ICD-10-CM

## 2024-03-02 DIAGNOSIS — R600 Localized edema: Secondary | ICD-10-CM

## 2024-03-02 DIAGNOSIS — I700 Atherosclerosis of aorta: Secondary | ICD-10-CM

## 2024-03-02 DIAGNOSIS — M009 Pyogenic arthritis, unspecified: Secondary | ICD-10-CM

## 2024-03-02 LAB — POCT GLYCOSYLATED HEMOGLOBIN (HGB A1C): Hemoglobin A1C: 5.5 % (ref 4.0–5.6)

## 2024-03-02 MED ORDER — PREGABALIN 50 MG PO CAPS: 50.0000 mg | ORAL_CAPSULE | Freq: Every evening | ORAL | 0 refills | Status: DC

## 2024-03-02 NOTE — Progress Notes (Signed)
 Name: Becky Trevino   MRN: [id number]    DOB: 14-Oct-1945   Date:03/02/2024       Progress Note  Subjective  Chief Complaint  Chief Complaint  Patient presents with   Medical Management of Chronic Issues   Discussed the use of AI scribe software for clinical note transcription with the patient, who gave verbal consent to proceed.  History of Present Illness Becky Trevino is a 78 year old female with diabetes and chronic kidney disease who presents for a regular four-month follow-up.  She has diabetes with renal manifestations and diabetic neuropathy. She takes Farxiga  without issues , she states neuropathy has been okay lately   She has chronic kidney disease stage 3B and is due for blood work to check her GFR, parathyroid , and vitamin D  levels. She does not currently see a nephrologist.  She has hypothyroidism and takes levothyroxine  50 micrograms daily, with regular thyroid  function checks.  She experiences esophageal dysmotility, particularly with bread, which sometimes feels like it gets stuck. She takes omeprazole  40 mg, which helps some, but she needs to pause eating when symptoms occur.  She has a history of chronic DVT diagnosed in 2021 and has an IVC filter. She is on Eliquis  and reports no extra swelling or pain in her legs. Denies bleeding   She takes atorvastatin  40 mg for high cholesterol and atherosclerosis of aorta and PAD, with no side effects reported. She is not sure of claudication since she only walks short distances inside her house  She has atrial fibrillation and takes Pacerone  and metoprolol  50 mg for rate control, with no palpitations or chest discomfort. She also takes Eliquis    She has a history of anxiety and takes Lexapro  5 mg. She occasionally uses trazodone  for sleep but reports she has been sleeping well lately without it.  She has a chronic infection in her right knee and takes amoxicillin  500 mg twice daily. She sees her specialist once a year for  follow-up.  She uses torsemide  for fluid management and reports swelling if she misses a dose. She uses a wheelchair primarily but can stand at home, though she experiences instability in her knee.  She reports a sore on her inner thigh due to rubbing from her pull-ups, which she manages with a drying ointment. She has tried various products to alleviate the irritation.  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) 12/01/2022   Interstitial lung disease (HCC) 12/01/2022   Atherosclerosis of native arteries of extremity with intermittent claudication (HCC) 10/16/2022   Anemia due to chronic kidney disease 08/26/2022   Hypothyroidism (acquired) 01/27/2022   PAD (peripheral artery disease) (HCC) 09/27/2021   AF (paroxysmal atrial fibrillation) (HCC) 09/15/2021   Primary osteoarthritis of right hip 07/18/2021   Atherosclerosis of aorta (HCC) 06/07/2021   Mild protein-calorie malnutrition (HCC)    Hyperbilirubinemia    Injury of left femoral nerve 08/28/2020   Lymphedema 06/25/2020   History of pelvic hematoma 02/10/2020   History of deep vein thrombosis 10/30/2019  Normocytic anemia 08/07/2019   Chronic infection of right knee (HCC) 11/25/2018   Infection and inflammatory reaction due to internal joint prosthesis, subsequent encounter 01/25/2018   Infection of total right knee replacement (HCC) 11/19/2017   Type 2 diabetes mellitus with stage 3b chronic kidney disease, without long-term current use of insulin  (HCC) 11/06/2016   Primary  osteoarthritis of knee 03/11/2016   Primary osteoarthritis of left hip 12/11/2015   Essential hypertension 04/21/2015   Bilateral lower extremity edema 04/21/2015   Chronic kidney disease (CKD), stage III (moderate) (HCC) 04/21/2015   Chronic venous insufficiency 04/21/2015   DDD (degenerative disc disease), cervical 04/21/2015   Diabetes mellitus with renal manifestation (HCC) 04/21/2015   Dyslipidemia 04/21/2015   Bladder cystocele 04/21/2015   Urinary incontinence 04/21/2015   Leg varices 04/21/2015   Insomnia 04/21/2015   Eczema intertrigo 04/21/2015   Morbid obesity (HCC) 04/21/2015   OSA (obstructive sleep apnea) 04/21/2015   Osteoarthritis of multiple joints 04/21/2015   H/O Spinal surgery 11/14/2013   Detrusor muscle hypertonia 11/07/2013    Past Surgical History:  Procedure Laterality Date   ABDOMINAL HYSTERECTOMY  1993   Total   BUNIONECTOMY Bilateral 1993   ESOPHAGOGASTRODUODENOSCOPY (EGD) WITH PROPOFOL  N/A 05/13/2023   Procedure: ESOPHAGOGASTRODUODENOSCOPY (EGD) WITH PROPOFOL ;  Surgeon: Luke Salaam, MD;  Location: [facility] ENDOSCOPY;  Service: Gastroenterology;  Laterality: N/A;   I & D KNEE WITH POLY EXCHANGE Right 11/19/2017   Procedure: RIGHT KNEE POLY EXCHANGE WITH IRRIGATION AND DEBRIDEMENT;  Surgeon: Molli Angelucci, MD;  Location: [facility] ORS;  Service: Orthopedics;  Laterality: Right;   IR FLUORO GUIDED NEEDLE PLC ASPIRATION/INJECTION LOC  09/19/2021   JOINT REPLACEMENT     bilateral knee   LAMINECTOMY  11/14/2013    Cervical Fusion , [facility], Dr. Jinnie Mountain   LASER ABLATION Bilateral 07/29/2012   Dr. Vonna Guardian   LOWER EXTREMITY ANGIOGRAPHY Right 11/02/2019   Procedure: Lower Extremity Angiography;  Surgeon: Jackquelyn Mass, MD;  Location: [facility] INVASIVE CV LAB;  Service: Cardiovascular;  Laterality: Right;   ORIF ANKLE FRACTURE Right 09/02/2019   Procedure: OPEN REDUCTION INTERNAL FIXATION (ORIF) ANKLE FRACTURE BIMALLEOLAR;  Surgeon: Pink Bridges, DPM;  Location: [facility] ORS;  Service:  Podiatry;  Laterality: Right;   PERIPHERAL VASCULAR THROMBECTOMY Right 03/02/2020   Procedure: PERIPHERAL VASCULAR THROMBECTOMY;  Surgeon: Celso College, MD;  Location: [facility];  Service: Cardiovascular;  Laterality: Right;   REPLACEMENT TOTAL KNEE Left 03/2010   Dr. Mozell Arias   TEE WITHOUT CARDIOVERSION N/A 09/19/2021   Procedure: TRANSESOPHAGEAL ECHOCARDIOGRAM (TEE);  Surgeon: Michelle Aid, MD;  Location: [facility] OR[facility];  Service: Cardiovascular;  Laterality: N/A;   TOTAL HIP ARTHROPLASTY Left 12/11/2015   Procedure: TOTAL HIP ARTHROPLASTY ANTERIOR APPROACH;  Surgeon: Molli Angelucci, MD;  Location: [facility] OR[facility];  Service: Orthopedics;  Laterality: Left;   TOTAL KNEE ARTHROPLASTY Right 03/11/2016   Procedure: TOTAL KNEE ARTHROPLASTY;  Surgeon: Molli Angelucci, MD;  Location: [facility] OR[facility];  Service: Orthopedics;  Laterality: Right;    Family History  Problem Relation Age of Onset   Diabetes Mother    Hyperlipidemia Mother    Hypertension Mother    Diabetes Father    Hyperlipidemia Father    Hypertension Father    Obesity Father    Hypertension Sister    Hyperlipidemia Sister    Hyperlipidemia Sister    Breast cancer Neg Hx     Social History   Tobacco Use   Smoking status: Never    Passive exposure: Never   Smokeless tobacco:  Never  Substance Use Topics   Alcohol use: No    Alcohol/week: 0.0 standard drinks of alcohol     Current Outpatient Medications:    ACCU-CHEK GUIDE test strip, TEST ONCE A DAY AS DIRECTED, Disp: 50 strip, Rfl: 1   acetaminophen  (TYLENOL ) 650 MG CR tablet, Take 650-1,300 mg by mouth 2 (two) times daily as needed for pain., Disp: , Rfl:    amiodarone  (PACERONE ) 200 MG tablet, Take 200 mg by mouth daily., Disp: , Rfl:    amoxicillin  (AMOXIL ) 500 MG capsule, Take 1 capsule (500 mg total) by mouth 2 (two) times daily., Disp: 60 capsule, Rfl: 12   apixaban  (ELIQUIS ) 5 MG TABS tablet, Take 1 tablet by mouth 2 (two) times daily., Disp: , Rfl:    atorvastatin  (LIPITOR)  40 MG tablet, TAKE 1 TABLET BY MOUTH EVERY DAY IN THE EVENING, Disp: 90 tablet, Rfl: 0   blood glucose meter kit and supplies, 1 each by Other route daily at 12 noon. Dispense based on patient and insurance preference. Check fsbs once a day, Disp: 1 each, Rfl: 0   Calcium  Carbonate-Vitamin D  600-400 MG-UNIT tablet, Take 1 tablet by mouth 2 (two) times daily., Disp: 60 tablet, Rfl: 0   dapagliflozin  propanediol (FARXIGA ) 10 MG TABS tablet, Take 1 tablet (10 mg total) by mouth daily before breakfast. Patient receives via [employer] Patient [employer], Disp: 90 tablet, Rfl: 3   escitalopram  (LEXAPRO ) 5 MG tablet, TAKE 1 TABLET (5 MG TOTAL) BY MOUTH DAILY., Disp: 90 tablet, Rfl: 0   ferrous sulfate  325 (65 FE) MG EC tablet, TAKE 1 TABLET BY MOUTH EVERY DAY, Disp: 90 tablet, Rfl: 3   fluticasone  (FLONASE ) 50 MCG/ACT nasal spray, SPRAY 2 SPRAYS INTO EACH NOSTRIL EVERY DAY, Disp: 48 mL, Rfl: 0   hydroquinone 4 % cream, Apply topically at bedtime as needed., Disp: , Rfl:    ketoconazole  (NIZORAL ) 2 % cream, Apply 1 Application topically as needed for irritation., Disp: , Rfl:    latanoprost  (XALATAN ) 0.005 % ophthalmic solution, Place 1 drop into both eyes at bedtime., Disp: , Rfl:    levothyroxine  (SYNTHROID ) 50 MCG tablet, TAKE 1 TABLET (50 MCG TOTAL) BY MOUTH DAILY. BUT TAKE TWO TABLETS ONCE A WEEK, Disp: 100 tablet, Rfl: 0   Magnesium  Oxide 500 MG CAPS, Take 500 mg by mouth daily. , Disp: , Rfl:    metoprolol  succinate (TOPROL -XL) 50 MG 24 hr tablet, Take 50 mg by mouth daily., Disp: , Rfl:    omeprazole  (PRILOSEC) 40 MG capsule, Take 1 capsule (40 mg total) by mouth daily., Disp: 90 capsule, Rfl: 3   oxybutynin  (DITROPAN -XL) 10 MG 24 hr tablet, Take 1 tablet (10 mg total) by mouth daily., Disp: 90 tablet, Rfl: 3   oxyCODONE -acetaminophen  (PERCOCET/ROXICET) 5-325 MG tablet, Take 1 tablet by mouth every 4 (four) hours as needed., Disp: , Rfl:    polyethylene glycol (MIRALAX  / GLYCOLAX ) 17 g packet, Take 17 g  by mouth daily. (Patient taking differently: Take 17 g by mouth every other day.), Disp: 14 each, Rfl: 0   potassium chloride  (KLOR-CON ) 10 MEQ tablet, Take 10 mEq by mouth daily., Disp: , Rfl:    pregabalin (LYRICA) 50 MG capsule, Take 1-2 capsules (50-100 mg total) by mouth every evening., Disp: 60 capsule, Rfl: 0   SIMBRINZA 1-0.2 % SUSP, Apply 1 drop to eye 2 (two) times daily., Disp: , Rfl:    sodium chloride  (OCEAN) 0.65 % SOLN nasal spray, Place 1 spray into both nostrils  3 (three) times daily., Disp: 30 mL, Rfl: 0   torsemide  (DEMADEX ) 20 MG tablet, TAKE 1 TABLET BY MOUTH EVERY DAY, Disp: 90 tablet, Rfl: 0   traZODone  (DESYREL ) 50 MG tablet, TAKE 1/2 TO 1 TABLET (25-50 MG TOTAL) BY MOUTH AT BEDTIME AS NEEDED. FOR SLEEP, Disp: 90 tablet, Rfl: 0  Allergies  Allergen Reactions   Lisinopril Swelling    I personally reviewed active problem list, medication list, allergies with the patient/caregiver today.   ROS  Ten systems reviewed and is negative except as mentioned in HPI    Objective Physical Exam CONSTITUTIONAL: Patient appears well-developed and well-nourished. No distress. HEENT: Head atraumatic, normocephalic, neck supple. CARDIOVASCULAR: Normal rate, regular rhythm and normal heart sounds. No murmur heard. Mild swelling in extremities. PULMONARY: Effort normal and breath sounds normal. Lungs clear to auscultation. No respiratory distress. ABDOMINAL: There is no tenderness or distention. MUSCULOSKELETAL:sitting on wheelchair, s/p surgery both knees with well healed scars  PSYCHIATRIC: Patient has a normal mood and affect. Behavior is normal. Judgment and thought content normal.  Vitals:   03/02/24 1004  BP: 104/66  Pulse: (!) 55  Resp: 16  Weight: 255 lb 6.4 oz (115.8 kg)  Height: 5' 11 (1.803 m)    Body mass index is 35.62 kg/m.  Recent Results (from the past 2160 hours)  Iron  and TIBC     Status: Abnormal   Collection Time: 12/28/23 10:29 AM  Result Value  Ref Range   Iron  47 28 - 170 ug/dL   TIBC 130 (L) 865 - 784 ug/dL   Saturation Ratios 20 10.4 - 31.8 %   UIBC 190 ug/dL    Comment: Performed at [facility], [address] [place], [place] [address]  Ferritin     Status: None   Collection Time: 12/28/23 10:29 AM  Result Value Ref Range   Ferritin 91 11 - 307 ng/mL    Comment: Performed at [facility], [address] [place], [place] [address]  CBC with Differential (Cancer Center Only)     Status: Abnormal   Collection Time: 12/28/23 10:29 AM  Result Value Ref Range   WBC Count 4.8 4.0 - 10.5 K/uL   RBC 3.83 (L) 3.87 - 5.11 MIL/uL   Hemoglobin 12.1 12.0 - 15.0 g/dL   HCT 32.4 40.1 - 02.7 %   MCV 100.3 (H) 80.0 - 100.0 fL   MCH 31.6 26.0 - 34.0 pg   MCHC 31.5 30.0 - 36.0 g/dL   RDW 25.3 (H) 66.4 - 40.3 %   Platelet Count 192 150 - 400 K/uL   nRBC 0.0 0.0 - 0.2 %   Neutrophils Relative % 68 %   Neutro Abs 3.3 1.7 - 7.7 K/uL   Lymphocytes Relative 20 %   Lymphs Abs 1.0 0.7 - 4.0 K/uL   Monocytes Relative 9 %   Monocytes Absolute 0.4 0.1 - 1.0 K/uL   Eosinophils Relative 2 %   Eosinophils Absolute 0.1 0.0 - 0.5 K/uL   Basophils Relative 0 %   Basophils Absolute 0.0 0.0 - 0.1 K/uL   Immature Granulocytes 1 %   Abs Immature Granulocytes 0.06 0.00 - 0.07 K/uL    Comment: Performed at [facility], [address] [place], [place] [address]  Retic Panel     Status: Abnormal   Collection Time: 12/28/23 10:29 AM  Result Value Ref Range   Retic Ct Pct 1.7 0.4 - 3.1 %   RBC. 3.80 (L) 3.87 - 5.11 MIL/uL  Retic Count, Absolute 64.2 19.0 - 186.0 K/uL   Immature Retic Fract 15.5 2.3 - 15.9 %   Reticulocyte Hemoglobin 30.5 >27.9 pg    Comment: Performed at [facility], [address] [place], [place] [address]  HM DIABETES FOOT EXAM     Status: None   Collection Time: 02/12/24 12:00 AM  Result Value Ref Range   HM Diabetic Foot Exam Dr.Baker   POCT glycosylated hemoglobin (Hb A1C)     Status:  None   Collection Time: 03/02/24 10:12 AM  Result Value Ref Range   Hemoglobin A1C 5.5 4.0 - 5.6 %   HbA1c POC (<> result, manual entry)     HbA1c, POC (prediabetic range)     HbA1c, POC (controlled diabetic range)      Diabetic Foot Exam:     PHQ2/9:    03/02/2024    9:52 AM 09/22/2023   12:43 PM 08/06/2023    9:53 AM 07/02/2023    9:43 AM 06/25/2023   11:01 AM  Depression screen PHQ 2/9  Decreased Interest 0 0 0 0 0  Down, Depressed, Hopeless 0 0 0 0 0  PHQ - 2 Score 0 0 0 0 0  Altered sleeping  0 0 0   Tired, decreased energy  0 0 0   Change in appetite  0 0 0   Feeling bad or failure about yourself   0 0 0   Trouble concentrating  0 0 0   Moving slowly or fidgety/restless  0 0 0   Suicidal thoughts  0 0 0   PHQ-9 Score  0 0 0   Difficult doing work/chores  Not difficult at all       phq 9 is negative  Fall Risk:    03/02/2024    9:52 AM 09/22/2023   12:43 PM 08/17/2023   11:36 AM 08/06/2023    9:56 AM 07/02/2023    9:43 AM  Fall Risk   Falls in the past year? 1 0  0 0  Number falls in past yr: 0 0 0 0   Injury with Fall? 0 0 0 0   Risk for fall due to : Impaired balance/gait No Fall Risks  No Fall Risks Impaired mobility;Impaired balance/gait;Orthopedic patient  Follow up Falls prevention discussed;Education provided;Falls evaluation completed Falls prevention discussed;Education provided;Falls evaluation completed  Falls prevention discussed;Falls evaluation completed Falls prevention discussed;Education provided;Falls evaluation completed      Assessment & Plan Type 2 diabetes mellitus with renal and neurological manifestations Diabetes well-controlled, A1c 5.5%. Neuropathy symptoms worsened post-gabapentin  discontinuation. Continues Farxiga  for renal protection. - Start pregabalin 50 mg at night, increase to 100 mg if needed. - Continue Farxiga .  Chronic kidney disease, stage 3B Stage 3B CKD with previous GFR 38. Monitoring for secondary  hyperparathyroidism. - Order comprehensive metabolic panel. - Check parathyroid  hormone and vitamin D  levels.  Atherosclerosis of aorta and peripheral arteries Atherosclerosis present. No issues with cholesterol medication. - Check lipid panel. - Continue atorvastatin  40 mg daily.  Obesity/Morbid  Obesity noted as comorbidity with diabetes and CKD.  Hypothyroidism Hypothyroidism well-managed on levothyroxine . Previous TSH normal. - Check TSH levels. - Continue levothyroxine  50 mcg daily.  Atrial fibrillation Rate-controlled with Pacerone  and metoprolol . No palpitations or chest discomfort. - Continue Pacerone  and metoprolol . - Continue Eliquis .  Chronic deep vein thrombosis of right lower extremity Chronic DVT managed with IVC filter and Eliquis . - Continue Eliquis .  Esophageal dysmotility Esophageal dysmotility with occasional dysphagia. Managed with omeprazole . -  Continue omeprazole  40 mg daily. - Advise small bites and slow eating.  Chronic infection of right knee Chronic knee infection managed with amoxicillin . - Continue amoxicillin  500 mg twice daily.  Anxiety Anxiety managed with Lexapro . Trazodone  used occasionally for sleep. - Continue Lexapro  5 mg daily. - Discontinue trazodone  if not needed.  Obstructive sleep apnea Issues obtaining CPAP machine. Supplies received, machine pending. - Assist in obtaining CPAP machine. - Coordinate with supply company.  General Health Maintenance Routine health maintenance up to date. Mammogram normal. Bone density scan due December. - Schedule bone density scan in December.  Follow-up Regular follow-up appointments scheduled. - Schedule follow-up in 4 months. - Coordinate lab work and specialist follow-up.

## 2024-03-03 LAB — LIPID PANEL
Cholesterol: 163 mg/dL (ref <200)
HDL: 66 mg/dL (ref >=50)
LDL Cholesterol (Calc): 83 mg/dL
Non-HDL Cholesterol (Calc): 97 mg/dL (ref <130)
Total CHOL/HDL Ratio: 2.5 (calc) (ref <5.0)
Triglycerides: 55 mg/dL (ref <150)

## 2024-03-03 LAB — COMPREHENSIVE METABOLIC PANEL WITH GFR
AG Ratio: 0.9 (calc) — ABNORMAL LOW (ref 1.0–2.5)
ALT: 15 U/L (ref 6–29)
AST: 19 U/L (ref 10–35)
Albumin: 3.6 g/dL (ref 3.6–5.1)
Alkaline phosphatase (APISO): 101 U/L (ref 37–153)
BUN/Creatinine Ratio: 17 (calc) (ref 6–22)
BUN: 23 mg/dL (ref 7–25)
CO2: 29 mmol/L (ref 20–32)
Calcium: 9.1 mg/dL (ref 8.6–10.4)
Chloride: 103 mmol/L (ref 98–110)
Creat: 1.33 mg/dL — ABNORMAL HIGH (ref 0.60–1.00)
Globulin: 4.2 g/dL — ABNORMAL HIGH (ref 1.9–3.7)
Glucose, Bld: 82 mg/dL (ref 65–99)
Potassium: 4.6 mmol/L (ref 3.5–5.3)
Sodium: 137 mmol/L (ref 135–146)
Total Bilirubin: 0.7 mg/dL (ref 0.2–1.2)
Total Protein: 7.8 g/dL (ref 6.1–8.1)
eGFR: 41 mL/min/{1.73_m2} — ABNORMAL LOW (ref >=60)

## 2024-03-03 LAB — VITAMIN D 25 HYDROXY (VIT D DEFICIENCY, FRACTURES): Vit D, 25-Hydroxy: 52 ng/mL (ref 30–100)

## 2024-03-03 LAB — PARATHYROID HORMONE, INTACT (NO CA): PTH: 53 pg/mL (ref 16–77)

## 2024-03-03 LAB — TSH: TSH: 4.25 m[IU]/L (ref 0.40–4.50)

## 2024-03-05 LAB — MICROALBUMIN / CREATININE URINE RATIO
Creatinine, Urine: 75 mg/dL (ref 20–275)
Microalb Creat Ratio: 4 mg/g{creat} (ref <30)
Microalb, Ur: 0.3 mg/dL

## 2024-03-06 ENCOUNTER — Ambulatory Visit: Payer: Self-pay | Admitting: Family Medicine

## 2024-03-07 ENCOUNTER — Other Ambulatory Visit: Payer: Self-pay

## 2024-03-07 DIAGNOSIS — E039 Hypothyroidism, unspecified: Secondary | ICD-10-CM

## 2024-03-07 MED ORDER — LEVOTHYROXINE SODIUM 50 MCG PO TABS: 50.0000 ug | ORAL_TABLET | Freq: Every day | ORAL | 0 refills | Status: DC

## 2024-03-08 ENCOUNTER — Other Ambulatory Visit: Payer: Self-pay | Admitting: Family Medicine

## 2024-03-08 DIAGNOSIS — R600 Localized edema: Secondary | ICD-10-CM

## 2024-03-08 DIAGNOSIS — F419 Anxiety disorder, unspecified: Secondary | ICD-10-CM

## 2024-03-08 DIAGNOSIS — E785 Hyperlipidemia, unspecified: Secondary | ICD-10-CM

## 2024-03-08 DIAGNOSIS — I700 Atherosclerosis of aorta: Secondary | ICD-10-CM

## 2024-03-10 NOTE — Progress Notes (Unsigned)
 Referring Physician:  No referring provider defined for this encounter.  Primary Physician:  Sowles, Krichna, MD  History of Present Illness: 03/17/2024 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   History of cervical fusion by Dr. Oma 10 years ago (occipital to C4 fusion?).   1 month history of intermittent right sided neck pain radiating down right arm to her hand. Pain is worse with activity. Better with rest. No known injury. She has constant numbness in both thumbs x 2 months. She has weakness in left arm x 4-5 months.   She is on ELIQUIS . She takes oxycodone  and lyrica .   She does not smoke.   Bowel/Bladder Dysfunction: none  Conservative measures:  Physical therapy: has not participated in PT  Multimodal medical therapy including regular antiinflammatories: Tylenol , Oxycodone   Injections: none 03/03/2024: Right hip joint injection under fluoroscopy   Past Surgery:  Cervical fusion by Dr. Oma 10 years ago (occipital to C4 fusion?)  Becky Trevino has no symptoms of cervical myelopathy.  The symptoms are causing a significant impact on the patient's life.   Review of Systems:  A 10 point review of systems is negative, except for the pertinent positives and negatives detailed in the HPI.  Past Medical History: Past Medical History:  Diagnosis Date   Cervical spondylosis    Chronic kidney disease    Stage 3   DDD (degenerative disc disease), cervical    [facility]   Diabetes mellitus without complication (HCC)    Diverticulosis    Hyperlipidemia    Hypertension    Intertrigo    Leg cramps    Leg varices    Obesity    OSA (obstructive sleep apnea)    Symptomatic menopausal or female climacteric states    Syncope      Past Surgical History: Past Surgical History:  Procedure Laterality Date   ABDOMINAL HYSTERECTOMY  1993   Total   BUNIONECTOMY Bilateral 1993   ESOPHAGOGASTRODUODENOSCOPY (EGD) WITH PROPOFOL  N/A 05/13/2023   Procedure: ESOPHAGOGASTRODUODENOSCOPY (EGD) WITH PROPOFOL ;  Surgeon: Therisa Bi, MD;  Location: [facility];  Service: Gastroenterology;  Laterality: N/A;   I & D KNEE WITH POLY EXCHANGE Right 11/19/2017   Procedure: RIGHT KNEE POLY EXCHANGE WITH IRRIGATION AND DEBRIDEMENT;  Surgeon: Kathlynn Sharper, MD;  Location: [facility];  Service: Orthopedics;  Laterality: Right;   IR FLUORO GUIDED NEEDLE PLC ASPIRATION/INJECTION LOC  09/19/2021   JOINT REPLACEMENT     bilateral knee   LAMINECTOMY  11/14/2013    Cervical Fusion , [facility], Dr. Oma   LASER ABLATION Bilateral 07/29/2012   Dr. Marea   LOWER EXTREMITY ANGIOGRAPHY Right 11/02/2019   Procedure: Lower Extremity Angiography;  Surgeon: Jama Cordella MATSU, MD;  Location: [facility];  Service: Cardiovascular;  Laterality: Right;   ORIF ANKLE FRACTURE Right 09/02/2019   Procedure: OPEN REDUCTION INTERNAL FIXATION (ORIF) ANKLE FRACTURE BIMALLEOLAR;  Surgeon: Lennie Barter, DPM;  Location: [facility];  Service: Podiatry;  Laterality: Right;   PERIPHERAL VASCULAR THROMBECTOMY Right 03/02/2020   Procedure: PERIPHERAL VASCULAR THROMBECTOMY;  Surgeon: Marea Selinda RAMAN, MD;  Location: [facility];  Service: Cardiovascular;  Laterality: Right;   REPLACEMENT TOTAL KNEE Left 03/2010   Dr. Kathlynn   TEE WITHOUT CARDIOVERSION N/A 09/19/2021   Procedure: TRANSESOPHAGEAL ECHOCARDIOGRAM (TEE);  Surgeon: Hester,  Wolm PARAS, MD;  Location: [facility];  Service: Cardiovascular;  Laterality: N/A;   TOTAL HIP ARTHROPLASTY Left 12/11/2015   Procedure: TOTAL HIP ARTHROPLASTY ANTERIOR APPROACH;  Surgeon: Ozell Flake, MD;  Location: [facility];  Service: Orthopedics;  Laterality: Left;   TOTAL KNEE ARTHROPLASTY Right 03/11/2016   Procedure: TOTAL KNEE ARTHROPLASTY;   Surgeon: Ozell Flake, MD;  Location: [facility];  Service: Orthopedics;  Laterality: Right;    Allergies: Allergies as of 03/17/2024 - Review Complete 03/17/2024  Allergen Reaction Noted   Lisinopril Swelling 04/10/2015    Medications: Outpatient Encounter Medications as of 03/17/2024  Medication Sig   ACCU-CHEK GUIDE test strip TEST ONCE A DAY AS DIRECTED   acetaminophen  (TYLENOL ) 650 MG CR tablet Take 650-1,300 mg by mouth 2 (two) times daily as needed for pain.   amiodarone  (PACERONE ) 200 MG tablet Take 200 mg by mouth daily.   amoxicillin  (AMOXIL ) 500 MG capsule Take 1 capsule (500 mg total) by mouth 2 (two) times daily.   apixaban  (ELIQUIS ) 5 MG TABS tablet Take 1 tablet by mouth 2 (two) times daily.   atorvastatin  (LIPITOR) 40 MG tablet TAKE 1 TABLET BY MOUTH EVERY DAY IN THE EVENING   blood glucose meter kit and supplies 1 each by Other route daily at 12 noon. Dispense based on patient and insurance preference. Check fsbs once a day   Calcium  Carbonate-Vitamin D  600-400 MG-UNIT tablet Take 1 tablet by mouth 2 (two) times daily.   dapagliflozin  propanediol (FARXIGA ) 10 MG TABS tablet Take 1 tablet (10 mg total) by mouth daily before breakfast. Patient receives via [facility] Patient Assistance   escitalopram  (LEXAPRO ) 5 MG tablet TAKE 1 TABLET (5 MG TOTAL) BY MOUTH DAILY.   ferrous sulfate  325 (65 FE) MG EC tablet TAKE 1 TABLET BY MOUTH EVERY DAY   fluticasone  (FLONASE ) 50 MCG/ACT nasal spray SPRAY 2 SPRAYS INTO EACH NOSTRIL EVERY DAY   hydroquinone 4 % cream Apply topically at bedtime as needed.   ketoconazole  (NIZORAL ) 2 % cream Apply 1 Application topically as needed for irritation.   latanoprost  (XALATAN ) 0.005 % ophthalmic solution Place 1 drop into both eyes at bedtime.   levothyroxine  (SYNTHROID ) 50 MCG tablet Take 1 tablet (50 mcg total) by mouth daily. New directions, TAKE 1 TABLET (50 MCG TOTAL) BY MOUTH DAILY. BUT TAKE TWO TABLETS TWICE A WEEK   Magnesium  Oxide 500 MG CAPS Take  500 mg by mouth daily.    metoprolol  succinate (TOPROL -XL) 50 MG 24 hr tablet Take 50 mg by mouth daily.   omeprazole  (PRILOSEC) 40 MG capsule Take 1 capsule (40 mg total) by mouth daily.   oxybutynin  (DITROPAN -XL) 10 MG 24 hr tablet Take 1 tablet (10 mg total) by mouth daily.   oxyCODONE -acetaminophen  (PERCOCET/ROXICET) 5-325 MG tablet Take 1 tablet by mouth every 4 (four) hours as needed.   polyethylene glycol (MIRALAX  / GLYCOLAX ) 17 g packet Take 17 g by mouth daily. (Patient taking differently: Take 17 g by mouth every other day.)   potassium chloride  (KLOR-CON ) 10 MEQ tablet Take 10 mEq by mouth daily.   pregabalin  (LYRICA ) 50 MG capsule Take 1-2 capsules (50-100 mg total) by mouth every evening.   SIMBRINZA 1-0.2 % SUSP Apply 1 drop to eye 2 (two) times daily.   sodium chloride  (OCEAN) 0.65 % SOLN nasal spray Place 1 spray into both nostrils 3 (three) times daily.   torsemide  (DEMADEX ) 20 MG tablet TAKE 1 TABLET BY MOUTH EVERY DAY   traZODone  (DESYREL ) 50 MG tablet  TAKE 1/2 TO 1 TABLET (25-50 MG TOTAL) BY MOUTH AT BEDTIME AS NEEDED. FOR SLEEP   [DISCONTINUED] dapagliflozin  propanediol (FARXIGA ) 10 MG TABS tablet Take 1 tablet (10 mg total) by mouth daily before breakfast. Patient receives via [employer] Patient Assistance   No facility-administered encounter medications on file as of 03/17/2024.    Social History: Social History   Tobacco Use   Smoking status: Never    Passive exposure: Never   Smokeless tobacco: Never  Vaping Use   Vaping status: Never Used  Substance Use Topics   Alcohol use: No    Alcohol/week: 0.0 standard drinks of alcohol   Drug use: No    Family Medical History: Family History  Problem Relation Age of Onset   Diabetes Mother    Hyperlipidemia Mother    Hypertension Mother    Diabetes Father    Hyperlipidemia Father    Hypertension Father    Obesity Father    Hypertension Sister    Hyperlipidemia Sister    Hyperlipidemia Sister    Breast cancer Neg  Hx     Physical Examination: Vitals:   03/17/24 0956  BP: 122/64    General: Patient is well developed, well nourished, calm, collected, and in no apparent distress. Attention to examination is appropriate.  Respiratory: Patient is breathing without any difficulty.   NEUROLOGICAL:     Awake, alert, oriented to person, place, and time.  Speech is clear and fluent. Fund of knowledge is appropriate.   Cranial Nerves: Pupils equal round and reactive to light.  Facial tone is symmetric.    No abnormal lesions on exposed skin.   Strength: Side Biceps Triceps Deltoid Interossei Grip Wrist Ext. Wrist Flex.  R 5 5 5 5 5 5 5   L 5 5 5 5 5 5 5    Side Iliopsoas Quads Hamstring PF DF EHL  R 3 4 5 5 5 5   L 5 5 5 5 5 5    She states weakness in right leg is chronic.   Reflexes are 2+ and symmetric at the biceps, brachioradialis.  Hoffman's is absent.    Bilateral upper and lower extremity sensation is intact to light touch.     She has braces on bilateral lower extremities.   She has very limited painful ROM of both shoulders, left > right. She has significant arthritic changes in both hands.   She is in a motorized scooter. Gait not tested.    Medical Decision Making  Imaging: No cervical imaging.   Assessment and Plan: Ms. Kofoed has a history of cervical surgery years ago (?fusion of occiput to C4). She improved after surgery.   Now with 1 month history of intermittent right sided neck pain radiating down right arm to her hand. She has constant numbness in both thumbs x 2 months. She has weakness in left arm x 4-5 months. No known injury.   No recent cervical imaging.   She has painful/limited ROM of both shoulders, left > right, that appears shoulder mediated.   She has some weakness in right leg that she states is chronic.   Treatment options discussed with patient and following plan made:   - MRI of cervical spine to further assess cervical radiculopathy.  -  Will get cervical xrays with flex/ext when she has MRI done.  - Recommend referral to ortho for bilateral shoulders. She has appointment with Dr. Kathlynn next week and will discuss with him.  - As above, she states right leg weakness is  chronic.  - Will schedule phone visit to review MRI and xray results once I get them back.   I spent a total of 35 minutes in face-to-face and non-face-to-face activities related to this patient's care today including review of outside records, review of imaging, review of symptoms, physical exam, discussion of differential diagnosis, discussion of treatment options, and documentation.   Thank you for involving me in the care of this patient.   Glade Boys PA-C Dept. of Neurosurgery

## 2024-03-16 ENCOUNTER — Telehealth: Payer: Self-pay

## 2024-03-16 DIAGNOSIS — N1832 Chronic kidney disease, stage 3b: Secondary | ICD-10-CM

## 2024-03-16 DIAGNOSIS — E1140 Type 2 diabetes mellitus with diabetic neuropathy, unspecified: Secondary | ICD-10-CM

## 2024-03-16 MED ORDER — DAPAGLIFLOZIN PROPANEDIOL 10 MG PO TABS: 10.0000 mg | ORAL_TABLET | Freq: Every day | ORAL | 3 refills | Status: AC

## 2024-03-16 NOTE — Telephone Encounter (Signed)
 Advised pt previous script was a 1 year supply but pt states pharmacy needing a new rx. Sent in a year supply to assistance pharmacy. Pt is aware

## 2024-03-16 NOTE — Telephone Encounter (Signed)
 Copied from CRM [id number]. Topic: Clinical - Prescription Issue >> Mar 16, 2024 12:10 PM Tobias L wrote: Reason for CRM: Patient attempted to get refill from [facility] for the farxiga . Patient informed by [facility] a new rx would be needed to be faxed to them.   Patient is almost out of rx. Patient requesting assistance with this  Patient requesting call back from [name] to discuss further: [phone]

## 2024-03-17 ENCOUNTER — Ambulatory Visit: Admitting: Orthopedic Surgery

## 2024-03-17 ENCOUNTER — Encounter: Payer: Self-pay | Admitting: Orthopedic Surgery

## 2024-03-17 VITALS — BP 122/64 | Ht 71.0 in | Wt 250.0 lb

## 2024-03-17 DIAGNOSIS — M542 Cervicalgia: Secondary | ICD-10-CM

## 2024-03-17 DIAGNOSIS — R200 Anesthesia of skin: Secondary | ICD-10-CM | POA: Diagnosis not present

## 2024-03-17 DIAGNOSIS — M5412 Radiculopathy, cervical region: Secondary | ICD-10-CM

## 2024-03-17 DIAGNOSIS — M25512 Pain in left shoulder: Secondary | ICD-10-CM

## 2024-03-17 DIAGNOSIS — R531 Weakness: Secondary | ICD-10-CM | POA: Diagnosis not present

## 2024-03-17 DIAGNOSIS — Z981 Arthrodesis status: Secondary | ICD-10-CM | POA: Diagnosis not present

## 2024-03-17 DIAGNOSIS — M25511 Pain in right shoulder: Secondary | ICD-10-CM

## 2024-03-17 NOTE — Patient Instructions (Signed)
 It was so nice to see you today. Thank you so much for coming in.    I want to get an MRI of your neck to look into things further. We will get this approved through your insurance and [facility] will call you to schedule the appointment. Ask about your patient responsibility. You do not need to pay this prior to getting MRI, they can bill you.   When you have the MRI done, remind them to get your xrays done.   [facility] (building with the white pillars) is located off of [address]. The address is [address] [place], [place] [address].    After you have the MRI and xrays, it takes 14-28 days for me to get the results back. Once I have the results, we will call you to schedule a follow up phone visit with me to review them.   Follow up with Dr. Kathlynn about your shoulders. I think some of your pain is coming from the shoulders.  Please do not hesitate to call if you have any questions or concerns. You can also message me in MyChart.   Glade Boys PA-C [phone]     The physicians and staff at [facility] at [facility] are committed to providing excellent care. You may receive a survey asking for feedback about your experience at our office. We value you your feedback and appreciate you taking the time to to fill it out. The [facility] leadership team is also available to discuss your experience in person, feel free to contact us  [phone].

## 2024-03-25 ENCOUNTER — Ambulatory Visit
Admission: RE | Admit: 2024-03-25 | Discharge: 2024-03-25 | Disposition: A | Discharge status: Home / Self Care | Source: Ambulatory Visit | Attending: Orthopedic Surgery | Admitting: Orthopedic Surgery

## 2024-03-25 ENCOUNTER — Other Ambulatory Visit

## 2024-03-25 ENCOUNTER — Ambulatory Visit: Admission: RE | Admit: 2024-03-25 | Source: Ambulatory Visit

## 2024-03-25 DIAGNOSIS — M5412 Radiculopathy, cervical region: Secondary | ICD-10-CM | POA: Insufficient documentation

## 2024-03-25 DIAGNOSIS — Z981 Arthrodesis status: Secondary | ICD-10-CM | POA: Diagnosis present

## 2024-03-25 DIAGNOSIS — M542 Cervicalgia: Secondary | ICD-10-CM

## 2024-03-30 ENCOUNTER — Telehealth: Payer: Self-pay | Admitting: Family Medicine

## 2024-03-30 NOTE — Progress Notes (Addendum)
 Telephone Visit- Progress Note: Referring Physician:  Sowles, Krichna, MD [address] [place],  [place] [address]  Primary Physician:  Sowles, Krichna, MD  This visit was performed via telephone.  Patient location: home Provider location: working from home  I spent a total of 15 minutes non-face-to-face activities for this visit on the date of this encounter including review of current clinical condition and response to treatment.    Patient has given verbal consent to this telephone visits and we reviewed the limitations of a telephone visit. Patient wishes to proceed.    Chief Complaint:  review cervical imaging  History of Present Illness: Becky Trevino is a 78 y.o. female has a history of  HTN, chronic venous insufficiency, afib, PAD, intermittent claudication, chronic DVT of right leg, OSA, interstitial lung disease, DM, hypothyroidism, CKD stage 3, dyslipidemia, obesity, lymphedema, history of injury to left femoral nerve.    History of cervical fusion by Dr. Oma 10 years ago (occipital to C4 fusion?).   Last seen by me on 03/17/24 for neck and right arm pain. She was sent to ortho for bilateral shoulder pain.   Phone visit scheduled to review her cervical MRI and xrays.   She saw Dr. Kathlynn and has osteonecrosis of her left shoulder. She said he discussed injection with her but wanted to wait until she had cervical MRI.    She continues with intermittent right sided neck pain radiating down right arm to her hand. Pain is worse with activity. Better with rest. She has constant numbness in both thumbs and notes weakness in left arm.  She has pain in both shoulders left > right that is worse with moving her arms.    She is on ELIQUIS . She takes oxycodone  and lyrica .    She does not smoke.    Conservative measures:  Physical therapy: has not participated in PT  Multimodal medical therapy including regular antiinflammatories: Tylenol , Oxycodone    Injections: none 03/03/2024: Right hip joint injection under fluoroscopy    Past Surgery:  Cervical fusion by Dr. Oma 10 years ago (occipital to C4 fusion?) Had cervical injections with Dr. Avanell back in 2015   Exam: No exam done as this was a telephone encounter.     Imaging: Cervical MRI dated 03/25/24:  FINDINGS: Alignment: Distant craniocervical fusion from the occiput put through C5. Chronic fixed anterolisthesis at C1-2 but without stenosis because of resection of the posterior arch of C1.   Vertebrae: As noted above, previous fusion surgery from the skull base through C5. Posterior fusion screws at the occiput put, C3, C4 and C5. Probable solid fusion from C3 through C5. C2-3 could remain a mobile level.   Cord: No cord compression ongoing.  No evidence of myelopathy.   Posterior Fossa, vertebral arteries, paraspinal tissues: Negative   Disc levels:   Foramen magnum is sufficiently patent because of resection of the posterior arch of C1. Chronic dens fracture with anterior positioning of C1 relative to C2 by 7.5 mm, as seen chronically.   C2-3: Sufficient canal patency. Foraminal detail is limited by artifact from fusion hardware.   C3 through C5: Probable fusion throughout that region. Wide canal patency. Foraminal detail is limited by artifact from fusion hardware.   C5-6: Spondylosis with endplate osteophytes and bulging of the disc. Narrowing of the subarachnoid space surrounding the cord but no cord compression or deformity. Bilateral foraminal stenosis that could affect either C6 nerve.   C6-7: Chronic spondylosis with endplate osteophytes and  bulging of the disc. Narrowing of the subarachnoid space surrounding the cord but no cord compression or deformity. Mild bilateral foraminal stenosis.   C7-T1: Bulging of the disc. Bilateral facet degeneration. No canal stenosis. Mild bilateral foraminal stenosis.   IMPRESSION: 1. Previous craniocervical  fusion from the occiput through C5. Chronic dens fracture with anterior positioning of C1 relative to C2 by 7.5 mm. Sufficient patency of the canal because of resection of the posterior arch of C1. 2. Probable solid fusion from C3 through C5. Wide canal patency in that region. Foraminal detail is limited by artifact from fusion hardware. 3. C5-6 spondylosis with bilateral foraminal stenosis that could affect either C6 nerve. 4. C6-7 spondylosis with mild bilateral foraminal stenosis. 5. C7-T1 facet osteoarthritis with mild bilateral foraminal stenosis.     Electronically Signed   By: Oneil Officer M.D.   On: 03/27/2024 16:34   Cervical xrays dated 03/25/14:  FINDINGS: Status post suboccipital fusion from the occiput to C4. Stable mild degree of basilar invagination. Degenerative disc disease at each cervical level with disc space narrowing and marginal osteophytes   IMPRESSION: Postoperative and degenerative changes.     Electronically Signed   By: Fonda Field M.D.   On: 03/27/2024 15:55  I have personally reviewed the images and agree with the above interpretation.  Above imaging reviewed with Dr. Claudene  Assessment and Plan: Becky Trevino has a history of cervical surgery years ago (?fusion of occiput to C4). She improved after surgery.   She continues with intermittent right sided neck pain radiating down right arm to her hand. She has constant numbness in both thumbs and notes weakness in left arm.  Her fusion looks good- difficult to tell on imaging if she is fused to C4 or C5 (reviewed with Dr. Claudene). She has cervical spondylosis and mild foraminal stenosis below her fusion. This may be contributing to her neck and right arm pain.   She has pain in both shoulders left > right that is worse with moving her arms.    Has known left shoulder osteonecrosis.   Treatment options discussed with patient and following plan made:   - Referral to Dr. Avanell to discuss  possible cervical injections.  - She needs to get cervical flex/ext xrays at no cost (obliques were done)- staff will contact her.  - Okay for left shoulder injection from my standpoint. Message to Dr. Kathlynn.  - Will call her once I have cervical flex/ext xrays.  - She has a follow up with me in 6-8 weeks and prn.   ADDENDUM 05/23/24:  She had cervical xrays done on 03/27/24 and 05/06/24- obliques were done instead of flex/ext views. We are discussing with radiology having her come back for correct xrays at no charge.   Glade Boys PA-C Neurosurgery

## 2024-03-30 NOTE — Telephone Encounter (Signed)
 Pt verbalized understanding.

## 2024-03-30 NOTE — Telephone Encounter (Signed)
 Her eliquis  was denied and she said at her last visit Dr Glenard was suppose to get in touch with someone. Pt requesting return call to discuss further.

## 2024-03-30 NOTE — Telephone Encounter (Signed)
 Eliquis  is prescribed from another provider

## 2024-04-01 ENCOUNTER — Ambulatory Visit (INDEPENDENT_AMBULATORY_CARE_PROVIDER_SITE_OTHER): Admitting: Orthopedic Surgery

## 2024-04-01 ENCOUNTER — Encounter: Payer: Self-pay | Admitting: Orthopedic Surgery

## 2024-04-01 DIAGNOSIS — M47812 Spondylosis without myelopathy or radiculopathy, cervical region: Secondary | ICD-10-CM

## 2024-04-01 DIAGNOSIS — M4802 Spinal stenosis, cervical region: Secondary | ICD-10-CM | POA: Diagnosis not present

## 2024-04-01 DIAGNOSIS — M8788 Other osteonecrosis, other site: Secondary | ICD-10-CM | POA: Diagnosis not present

## 2024-04-01 DIAGNOSIS — M25512 Pain in left shoulder: Secondary | ICD-10-CM

## 2024-04-01 DIAGNOSIS — M79601 Pain in right arm: Secondary | ICD-10-CM | POA: Diagnosis not present

## 2024-04-01 DIAGNOSIS — M5412 Radiculopathy, cervical region: Secondary | ICD-10-CM

## 2024-04-01 DIAGNOSIS — Z981 Arthrodesis status: Secondary | ICD-10-CM

## 2024-04-01 DIAGNOSIS — M25511 Pain in right shoulder: Secondary | ICD-10-CM

## 2024-04-11 ENCOUNTER — Other Ambulatory Visit: Payer: Self-pay | Admitting: Family Medicine

## 2024-04-14 NOTE — Telephone Encounter (Signed)
 No answer from pt left detailed vm [facility].

## 2024-04-15 ENCOUNTER — Telehealth: Payer: Self-pay

## 2024-04-15 NOTE — Telephone Encounter (Signed)
 Previous rx request Dr.Sowles wanted to know: Is she taking one or two at night ? I can adjust dose to 100 mg if she has been taking two   Patient has only been taking it once daily. Can you refill please

## 2024-04-15 NOTE — Telephone Encounter (Signed)
 Copied from CRM [id number]. Topic: General - Other >> Apr 15, 2024  3:50 PM Marissa P wrote: Reason for CRM: Patient giving Renteria-Garcia, Lorita, CMA a call back, please call back when you can expected a callback by Monday.

## 2024-04-18 ENCOUNTER — Other Ambulatory Visit: Payer: Self-pay | Admitting: Internal Medicine

## 2024-04-18 DIAGNOSIS — E1140 Type 2 diabetes mellitus with diabetic neuropathy, unspecified: Secondary | ICD-10-CM

## 2024-04-18 MED ORDER — PREGABALIN 50 MG PO CAPS: 50.0000 mg | ORAL_CAPSULE | Freq: Every evening | ORAL | 0 refills | Status: DC

## 2024-04-18 NOTE — Telephone Encounter (Signed)
 Left detailed vm

## 2024-04-19 ENCOUNTER — Other Ambulatory Visit: Payer: Self-pay | Admitting: Family Medicine

## 2024-04-19 DIAGNOSIS — M5412 Radiculopathy, cervical region: Secondary | ICD-10-CM

## 2024-04-20 ENCOUNTER — Telehealth: Payer: Self-pay

## 2024-04-20 NOTE — Telephone Encounter (Signed)
 No answer left detialed vm

## 2024-04-20 NOTE — Telephone Encounter (Signed)
 Copied from CRM [id number]. Topic: Clinical - Home Health Verbal Orders >> Apr 20, 2024 10:28 AM Charlet HERO wrote: Caller/Agency: Lorie/ [facility]  Callback Number: [phone] Service Requested: Physical Therapy Frequency: 1x for 6wk  Any new concerns about the patient? No

## 2024-05-05 ENCOUNTER — Ambulatory Visit (INDEPENDENT_AMBULATORY_CARE_PROVIDER_SITE_OTHER): Admitting: Family Medicine

## 2024-05-05 ENCOUNTER — Encounter: Payer: Self-pay | Admitting: Family Medicine

## 2024-05-05 VITALS — BP 122/74 | HR 53 | Temp 98.1°F | Resp 18 | Ht 71.0 in

## 2024-05-05 DIAGNOSIS — L989 Disorder of the skin and subcutaneous tissue, unspecified: Secondary | ICD-10-CM | POA: Diagnosis not present

## 2024-05-05 DIAGNOSIS — E039 Hypothyroidism, unspecified: Secondary | ICD-10-CM | POA: Diagnosis not present

## 2024-05-05 DIAGNOSIS — L659 Nonscarring hair loss, unspecified: Secondary | ICD-10-CM

## 2024-05-05 MED ORDER — LEVOTHYROXINE SODIUM 50 MCG PO TABS
50.0000 ug | ORAL_TABLET | Freq: Every day | ORAL | 0 refills | Status: DC
Start: 2024-05-05 — End: 2024-07-12

## 2024-05-05 NOTE — Progress Notes (Signed)
 Name: Becky Trevino   MRN: [id number]    DOB: 1946/02/26   Date:05/05/2024       Progress Note  Subjective  Chief Complaint  Chief Complaint  Patient presents with   re check labs    To early for regular f/u   Hypothyroidism   Discussed the use of AI scribe software for clinical note transcription with the patient, who gave verbal consent to proceed.  History of Present Illness Becky Trevino is a 78 year old female with hypothyroidism who presents for a six-week follow-up on her thyroid  medication.  She reports increased dry skin and hair thinning. Her TSH level was previously 4.25, which is on the higher end of normal. She has been taking levothyroxine  50 micrograms daily, but two pills on M and W, same dose she was taken prior to last lab drawn.  She is also seeing a dermatologist for a dark spot on her nose, which has grown since it was first noticed. The dermatologist provided a cream for this condition. Additionally, she has a bald spot and she plans to make another appointment with the dermatologist to address this issue.  She is dealing with issues related to her medication coverage for Eliquis , which she takes for paroxysmal atrial fibrillation and chronic deep vein thrombosis. She reports a gap in her coverage and has been working with [employer] and her cardiologist's office to resolve the issue. She recently had to pay $47 for a month's supply, which is usually covered by her insurance.    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) 12/01/2022   Interstitial lung disease (HCC) 12/01/2022   Atherosclerosis  of native arteries of extremity with intermittent claudication (HCC) 10/16/2022   Anemia due to chronic kidney disease 08/26/2022   Hypothyroidism (acquired) 01/27/2022   PAD (peripheral artery disease) (HCC) 09/27/2021   AF (paroxysmal atrial fibrillation) (HCC) 09/15/2021   Primary osteoarthritis of right hip 07/18/2021   Atherosclerosis of aorta (HCC) 06/07/2021   Mild protein-calorie malnutrition (HCC)    Hyperbilirubinemia    Injury of left femoral nerve 08/28/2020   Lymphedema 06/25/2020   History of pelvic hematoma 02/10/2020   History of deep vein thrombosis 10/30/2019   Normocytic anemia 08/07/2019   Chronic infection of right knee (HCC) 11/25/2018   Infection and inflammatory reaction due to internal joint prosthesis, subsequent encounter 01/25/2018   Infection of total right knee replacement (HCC) 11/19/2017   Type 2 diabetes mellitus with stage 3b chronic kidney disease, without long-term current use of insulin  (HCC) 11/06/2016   Primary osteoarthritis of knee 03/11/2016   Primary osteoarthritis of left hip 12/11/2015   Essential hypertension 04/21/2015   Bilateral lower extremity edema 04/21/2015   Chronic kidney disease (CKD), stage III (moderate) (HCC) 04/21/2015   Chronic venous insufficiency 04/21/2015   DDD (degenerative disc disease), cervical 04/21/2015   Diabetes mellitus with renal manifestation (HCC) 04/21/2015   Dyslipidemia 04/21/2015   Bladder cystocele 04/21/2015   Urinary incontinence 04/21/2015   Leg varices 04/21/2015   Insomnia 04/21/2015   Eczema intertrigo 04/21/2015   Morbid obesity (HCC) 04/21/2015   OSA (obstructive sleep apnea) 04/21/2015  Osteoarthritis of multiple joints 04/21/2015   H/O Spinal surgery 11/14/2013   Detrusor muscle hypertonia 11/07/2013    Past Surgical History:  Procedure Laterality Date   ABDOMINAL HYSTERECTOMY  1993   Total   BUNIONECTOMY Bilateral 1993   ESOPHAGOGASTRODUODENOSCOPY (EGD) WITH PROPOFOL  N/A 05/13/2023    Procedure: ESOPHAGOGASTRODUODENOSCOPY (EGD) WITH PROPOFOL ;  Surgeon: Therisa Bi, MD;  Location: [facility];  Service: Gastroenterology;  Laterality: N/A;   I & D KNEE WITH POLY EXCHANGE Right 11/19/2017   Procedure: RIGHT KNEE POLY EXCHANGE WITH IRRIGATION AND DEBRIDEMENT;  Surgeon: Kathlynn Sharper, MD;  Location: [facility];  Service: Orthopedics;  Laterality: Right;   IR FLUORO GUIDED NEEDLE PLC ASPIRATION/INJECTION LOC  09/19/2021   JOINT REPLACEMENT     bilateral knee   LAMINECTOMY  11/14/2013    Cervical Fusion , [facility], Dr. Oma   LASER ABLATION Bilateral 07/29/2012   Dr. Marea   LOWER EXTREMITY ANGIOGRAPHY Right 11/02/2019   Procedure: Lower Extremity Angiography;  Surgeon: Jama Cordella MATSU, MD;  Location: [facility];  Service: Cardiovascular;  Laterality: Right;   ORIF ANKLE FRACTURE Right 09/02/2019   Procedure: OPEN REDUCTION INTERNAL FIXATION (ORIF) ANKLE FRACTURE BIMALLEOLAR;  Surgeon: Lennie Barter, DPM;  Location: [facility];  Service: Podiatry;  Laterality: Right;   PERIPHERAL VASCULAR THROMBECTOMY Right 03/02/2020   Procedure: PERIPHERAL VASCULAR THROMBECTOMY;  Surgeon: Marea Selinda RAMAN, MD;  Location: [facility];  Service: Cardiovascular;  Laterality: Right;   REPLACEMENT TOTAL KNEE Left 03/2010   Dr. Kathlynn   TEE WITHOUT CARDIOVERSION N/A 09/19/2021   Procedure: TRANSESOPHAGEAL ECHOCARDIOGRAM (TEE);  Surgeon: Hester Wolm PARAS, MD;  Location: [facility];  Service: Cardiovascular;  Laterality: N/A;   TOTAL HIP ARTHROPLASTY Left 12/11/2015   Procedure: TOTAL HIP ARTHROPLASTY ANTERIOR APPROACH;  Surgeon: Sharper Kathlynn, MD;  Location: [facility];  Service: Orthopedics;  Laterality: Left;   TOTAL KNEE ARTHROPLASTY Right 03/11/2016   Procedure: TOTAL KNEE ARTHROPLASTY;  Surgeon: Sharper Kathlynn, MD;  Location: [facility];  Service: Orthopedics;  Laterality: Right;    Family History  Problem Relation Age of Onset   Diabetes Mother    Hyperlipidemia Mother    Hypertension Mother     Diabetes Father    Hyperlipidemia Father    Hypertension Father    Obesity Father    Hypertension Sister    Hyperlipidemia Sister    Hyperlipidemia Sister    Breast cancer Neg Hx     Social History   Tobacco Use   Smoking status: Never    Passive exposure: Never   Smokeless tobacco: Never  Substance Use Topics   Alcohol use: No    Alcohol/week: 0.0 standard drinks of alcohol     Current Outpatient Medications:    ACCU-CHEK GUIDE test strip, TEST ONCE A DAY AS DIRECTED, Disp: 50 strip, Rfl: 1   acetaminophen  (TYLENOL ) 650 MG CR tablet, Take 650-1,300 mg by mouth 2 (two) times daily as needed for pain., Disp: , Rfl:    amiodarone  (PACERONE ) 200 MG tablet, Take 200 mg by mouth daily., Disp: , Rfl:    amoxicillin  (AMOXIL ) 500 MG capsule, Take 1 capsule (500 mg total) by mouth 2 (two) times daily., Disp: 60 capsule, Rfl: 12   apixaban  (ELIQUIS ) 5 MG TABS tablet, Take 1 tablet by mouth 2 (two) times daily., Disp: , Rfl:    atorvastatin  (LIPITOR) 40 MG tablet, TAKE 1 TABLET BY MOUTH EVERY DAY IN THE EVENING, Disp: 90 tablet, Rfl: 1   blood glucose meter kit and  supplies, 1 each by Other route daily at 12 noon. Dispense based on patient and insurance preference. Check fsbs once a day, Disp: 1 each, Rfl: 0   Calcium  Carbonate-Vitamin D  600-400 MG-UNIT tablet, Take 1 tablet by mouth 2 (two) times daily., Disp: 60 tablet, Rfl: 0   dapagliflozin  propanediol (FARXIGA ) 10 MG TABS tablet, Take 1 tablet (10 mg total) by mouth daily before breakfast. Patient receives via [employer] Patient Assistance, Disp: 90 tablet, Rfl: 3   escitalopram  (LEXAPRO ) 5 MG tablet, TAKE 1 TABLET (5 MG TOTAL) BY MOUTH DAILY., Disp: 90 tablet, Rfl: 1   ferrous sulfate  325 (65 FE) MG EC tablet, TAKE 1 TABLET BY MOUTH EVERY DAY, Disp: 90 tablet, Rfl: 3   fluticasone  (FLONASE ) 50 MCG/ACT nasal spray, SPRAY 2 SPRAYS INTO EACH NOSTRIL EVERY DAY, Disp: 48 mL, Rfl: 0   hydroquinone 4 % cream, Apply topically at bedtime as needed.,  Disp: , Rfl:    ketoconazole  (NIZORAL ) 2 % cream, Apply 1 Application topically as needed for irritation., Disp: , Rfl:    latanoprost  (XALATAN ) 0.005 % ophthalmic solution, Place 1 drop into both eyes at bedtime., Disp: , Rfl:    levothyroxine  (SYNTHROID ) 50 MCG tablet, Take 1 tablet (50 mcg total) by mouth daily. New directions, TAKE 1 TABLET (50 MCG TOTAL) BY MOUTH DAILY. BUT TAKE TWO TABLETS TWICE A WEEK, Disp: 100 tablet, Rfl: 0   Magnesium  Oxide 500 MG CAPS, Take 500 mg by mouth daily. , Disp: , Rfl:    metoprolol  succinate (TOPROL -XL) 50 MG 24 hr tablet, Take 50 mg by mouth daily., Disp: , Rfl:    omeprazole  (PRILOSEC) 40 MG capsule, Take 1 capsule (40 mg total) by mouth daily., Disp: 90 capsule, Rfl: 3   oxybutynin  (DITROPAN -XL) 10 MG 24 hr tablet, Take 1 tablet (10 mg total) by mouth daily., Disp: 90 tablet, Rfl: 3   oxyCODONE -acetaminophen  (PERCOCET/ROXICET) 5-325 MG tablet, Take 1 tablet by mouth every 4 (four) hours as needed., Disp: , Rfl:    polyethylene glycol (MIRALAX  / GLYCOLAX ) 17 g packet, Take 17 g by mouth daily. (Patient taking differently: Take 17 g by mouth every other day.), Disp: 14 each, Rfl: 0   potassium chloride  (KLOR-CON ) 10 MEQ tablet, Take 10 mEq by mouth daily., Disp: , Rfl:    pregabalin  (LYRICA ) 50 MG capsule, Take 1 capsule (50 mg total) by mouth every evening., Disp: 30 capsule, Rfl: 0   SIMBRINZA 1-0.2 % SUSP, Apply 1 drop to eye 2 (two) times daily., Disp: , Rfl:    sodium chloride  (OCEAN) 0.65 % SOLN nasal spray, Place 1 spray into both nostrils 3 (three) times daily., Disp: 30 mL, Rfl: 0   torsemide  (DEMADEX ) 20 MG tablet, TAKE 1 TABLET BY MOUTH EVERY DAY, Disp: 90 tablet, Rfl: 1   traZODone  (DESYREL ) 50 MG tablet, TAKE 1/2 TO 1 TABLET (25-50 MG TOTAL) BY MOUTH AT BEDTIME AS NEEDED. FOR SLEEP, Disp: 90 tablet, Rfl: 0  Allergies  Allergen Reactions   Lisinopril Swelling    I personally reviewed active problem list, medication list, allergies with the  patient/caregiver today.   ROS  Ten systems reviewed and is negative except as mentioned in HPI    Objective Physical Exam CONSTITUTIONAL: Patient appears well-developed and well-nourished. No distress. HEENT: Head atraumatic, normocephalic, neck supple. CARDIOVASCULAR: Normal rate, irregular rhythm and normal heart sounds. No murmur heard.  PULMONARY: Effort normal and breath sounds normal. No respiratory distress. MUSCULOSKELETAL: on wheelchair  PSYCHIATRIC: Patient has a normal mood  and affect. Behavior is normal. Judgment and thought content normal.  Vitals:   05/05/24 0949  BP: 122/74  Pulse: (!) 53  Resp: 18  Temp: 98.1 F (36.7 C)  SpO2: 97%  Height: 5' 11 (1.803 m)    Body mass index is 34.87 kg/m.  Recent Results (from the past 2160 hours)  HM DIABETES FOOT EXAM     Status: None   Collection Time: 02/12/24 12:00 AM  Result Value Ref Range   [facility] Diabetic Foot Exam Dr.Baker   POCT glycosylated hemoglobin (Hb A1C)     Status: None   Collection Time: 03/02/24 10:12 AM  Result Value Ref Range   Hemoglobin A1C 5.5 4.0 - 5.6 %   HbA1c POC (<> result, manual entry)     HbA1c, POC (prediabetic range)     HbA1c, POC (controlled diabetic range)    Comprehensive metabolic panel with GFR     Status: Abnormal   Collection Time: 03/02/24 10:48 AM  Result Value Ref Range   Glucose, Bld 82 65 - 99 mg/dL    Comment: .            Fasting reference interval .    BUN 23 7 - 25 mg/dL   Creat 8.66 (H) 9.39 - 1.00 mg/dL   eGFR 41 (L) > OR = 60 mL/min/1.81m2   BUN/Creatinine Ratio 17 6 - 22 (calc)   Sodium 137 135 - 146 mmol/L   Potassium 4.6 3.5 - 5.3 mmol/L   Chloride 103 98 - 110 mmol/L   CO2 29 20 - 32 mmol/L   Calcium  9.1 8.6 - 10.4 mg/dL   Total Protein 7.8 6.1 - 8.1 g/dL   Albumin 3.6 3.6 - 5.1 g/dL   Globulin 4.2 (H) 1.9 - 3.7 g/dL (calc)   AG Ratio 0.9 (L) 1.0 - 2.5 (calc)   Total Bilirubin 0.7 0.2 - 1.2 mg/dL   Alkaline phosphatase (APISO) 101 37 - 153 U/L    AST 19 10 - 35 U/L   ALT 15 6 - 29 U/L  Parathyroid  hormone, intact (no Ca)     Status: None   Collection Time: 03/02/24 10:48 AM  Result Value Ref Range   PTH 53 16 - 77 pg/mL    Comment: . Interpretive Guide    Intact PTH           Calcium  ------------------    ----------           ------- Normal Parathyroid     Normal               Normal Hypoparathyroidism    Low or Low Normal    Low Hyperparathyroidism    Primary            Normal or High       High    Secondary          High                 Normal or Low    Tertiary           High                 High Non-Parathyroid     Hypercalcemia      Low or Low Normal    High .   Lipid panel     Status: None   Collection Time: 03/02/24 10:48 AM  Result Value Ref Range   Cholesterol 163 <200 mg/dL   HDL 66 > OR =  50 mg/dL   Triglycerides 55 <849 mg/dL   LDL Cholesterol (Calc) 83 mg/dL (calc)    Comment: Reference range: <100 . Desirable range <100 mg/dL for primary prevention;   <70 mg/dL for patients with CHD or diabetic patients  with > or = 2 CHD risk factors. [name] LDL-C is now calculated using the Martin-Hopkins  calculation, which is a validated novel method providing  better accuracy than the Friedewald equation in the  estimation of LDL-C.  Gladis APPLETHWAITE et al. SANDREA. 7986;689(80): 2061-2068  (http://education.[facility].com/faq/FAQ164)    Total CHOL/HDL Ratio 2.5 <5.0 (calc)   Non-HDL Cholesterol (Calc) 97 <869 mg/dL (calc)    Comment: For patients with diabetes plus 1 major ASCVD risk  factor, treating to a non-HDL-C goal of <100 mg/dL  (LDL-C of <29 mg/dL) is considered a therapeutic  option.   VITAMIN D  25 Hydroxy (Vit-D Deficiency, Fractures)     Status: None   Collection Time: 03/02/24 10:48 AM  Result Value Ref Range   Vit D, 25-Hydroxy 52 30 - 100 ng/mL    Comment: Vitamin D  Status         25-OH Vitamin D : . Deficiency:                    <20 ng/mL Insufficiency:             20 - 29 ng/mL Optimal:                  > or = 30 ng/mL . For 25-OH Vitamin D  testing on patients on  D2-supplementation and patients for whom quantitation  of D2 and D3 fractions is required, the QuestAssureD(TM) 25-OH VIT D, (D2,D3), LC/MS/MS is recommended: order  code 07111 (patients >39yrs). . See Note 1 . Note 1 . For additional information, please refer to  http://education.[facility].com/faq/FAQ199  (This link is being provided for informational/ educational purposes only.)   TSH     Status: None   Collection Time: 03/02/24 10:48 AM  Result Value Ref Range   TSH 4.25 0.40 - 4.50 mIU/L  Urine Microalbumin w/creat. ratio     Status: None   Collection Time: 03/04/24  4:04 PM  Result Value Ref Range   Creatinine, Urine 75 20 - 275 mg/dL   Microalb, Ur 0.3 mg/dL    Comment: Reference Range Not established    Microalb Creat Ratio 4 <30 mg/g creat    Comment: . The ADA defines abnormalities in albumin excretion as follows: [name] Albuminuria Category        Result (mg/g creatinine) . Normal to Mildly increased   <30 Moderately increased         30-299  Severely increased           > OR = 300 . The ADA recommends that at least two of three specimens collected within a 3-6 month period be abnormal before considering a patient to be within a diagnostic category.     Diabetic Foot Exam:     PHQ2/9:    05/05/2024   10:00 AM 03/02/2024    9:52 AM 09/22/2023   12:43 PM 08/06/2023    9:53 AM 07/02/2023    9:43 AM  Depression screen PHQ 2/9  Decreased Interest 0 0 0 0 0  Down, Depressed, Hopeless 0 0 0 0 0  PHQ - 2 Score 0 0 0 0 0  Altered sleeping 0  0 0 0  Tired, decreased energy 1  0 0 0  Change in  appetite 0  0 0 0  Feeling bad or failure about yourself  0  0 0 0  Trouble concentrating 0  0 0 0  Moving slowly or fidgety/restless 0  0 0 0  Suicidal thoughts 0  0 0 0  PHQ-9 Score 1  0 0 0  Difficult doing work/chores Not difficult at all  Not difficult at all      phq 9 is  negative  Fall Risk:    05/05/2024   10:00 AM 03/02/2024    9:52 AM 09/22/2023   12:43 PM 08/17/2023   11:36 AM 08/06/2023    9:56 AM  Fall Risk   Falls in the past year? 0 1 0  0  Number falls in past yr: 0 0 0 0 0  Injury with Fall? 0 0 0 0 0  Risk for fall due to :  Impaired balance/gait No Fall Risks  No Fall Risks  Follow up Falls evaluation completed Falls prevention discussed;Education provided;Falls evaluation completed Falls prevention discussed;Education provided;Falls evaluation completed  Falls prevention discussed;Falls evaluation completed     Assessment & Plan Hypothyroidism TSH level at 4.25, slightly elevated. Symptoms of dry skin and hair thinning. Possible link to amiodarone  use for atrial fibrillation.  - Instructed to take two 50 microgram pills three times a week until follow up in October and we will recheck labs at that time since she has been taking same dose  - Recheck TSH levels at next visit in October.  Alopecia - follow up with dermatologist, large area on right frontal area

## 2024-05-06 ENCOUNTER — Ambulatory Visit
Admission: RE | Admit: 2024-05-06 | Discharge: 2024-05-06 | Disposition: A | Discharge status: Home / Self Care | Source: Ambulatory Visit | Attending: Orthopedic Surgery | Admitting: Orthopedic Surgery

## 2024-05-06 DIAGNOSIS — M5412 Radiculopathy, cervical region: Secondary | ICD-10-CM | POA: Diagnosis present

## 2024-05-20 ENCOUNTER — Telehealth: Payer: Self-pay

## 2024-05-20 NOTE — Telephone Encounter (Signed)
 Copied from CRM #[id number]. Topic: Clinical - Medical Advice >> May 20, 2024 10:40 AM Selinda RAMAN wrote: Reason for CRM: The patient called in stating Dr Kathlynn her Orthopedic Surgeon with [facility] prescribed her Prednisone for inflammation in her knee. She said it really helps. She called in stating Dr Kathlynn asked her to call her primary care provider to make sure it is ok if she gets a refill of the prednisone before he prescribes it again. Please assist patient further.

## 2024-05-20 NOTE — Telephone Encounter (Signed)
 Is it ok to him to refill one more time, or is that a no?

## 2024-05-20 NOTE — Telephone Encounter (Signed)
 Pt.notified

## 2024-05-20 NOTE — Telephone Encounter (Signed)
 When I called pt back there was mis communication in message, she is wanting you to give refill.  I told her if ortho will not refill then you want.

## 2024-05-30 ENCOUNTER — Ambulatory Visit: Admitting: Orthopedic Surgery

## 2024-06-05 ENCOUNTER — Other Ambulatory Visit: Payer: Self-pay | Admitting: Physician Assistant

## 2024-06-05 DIAGNOSIS — K219 Gastro-esophageal reflux disease without esophagitis: Secondary | ICD-10-CM

## 2024-06-09 ENCOUNTER — Other Ambulatory Visit: Payer: Self-pay | Admitting: Family Medicine

## 2024-06-09 DIAGNOSIS — G4709 Other insomnia: Secondary | ICD-10-CM

## 2024-06-17 NOTE — Progress Notes (Signed)
 Referring Physician:  Sowles, Krichna, MD [address] [place],  [place] [address]  Primary Physician:  Sowles, Krichna, MD  History of Present Illness: 06/20/2024 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   History of cervical fusion by Dr. Oma 10 years ago (occipital to C4 fusion?).   Last did phone visit with me on 04/01/24. She was having right sided neck and arm pain.   Her fusion looked good- difficult to tell on imaging if she is fused to C4 or C5 (reviewed with Dr. Claudene). She has cervical spondylosis and mild foraminal stenosis below her fusion. This may be contributing to her neck and right arm pain.    She had pain in both shoulders left > right that was worse with moving her arms.    Has known left shoulder osteonecrosis.   She was referred to Dr. Avanell for possible injections. She was started in HHPT by PMR. She had right C5-C6 TF ESI on 05/04/24 with great improvement. She is to call for repeat injection if needed.   She is here for follow up.   Improvement in right sided neck pain after ESI and HHPT. She still has numbness in both thumbs. She saw ortho for her left shoulder- she had significant deterioration on xray unstable joint.   Right now her biggest complaint is her left shoulder pain and limited ROM.    She is on ELIQUIS . She takes oxycodone  (from Dr. Kathlynn) and lyrica .    She does not smoke.   Conservative measures:  Physical therapy: has not participated in PT  Multimodal medical therapy including regular antiinflammatories: Tylenol , Oxycodone   Injections:  05/04/2024: Right C5-6 transforaminal ESI 03/03/2024: Right hip joint injection under fluoroscopy (100% relief)  Past Surgery:  Cervical fusion by Dr.  Oma 10 years ago (occipital to C4 fusion?)  Becky Trevino has no symptoms of cervical myelopathy.  The symptoms are causing a significant impact on the patient's life.   Review of Systems:  A 10 point review of systems is negative, except for the pertinent positives and negatives detailed in the HPI.  Past Medical History: Past Medical History:  Diagnosis Date   Cervical spondylosis    Chronic kidney disease    Stage 3   DDD (degenerative disc disease), cervical    [facility]   Diabetes mellitus without complication (HCC)    Diverticulosis    Hyperlipidemia    Hypertension    Intertrigo    Leg cramps    Leg varices    Obesity    OSA (obstructive sleep apnea)    Symptomatic menopausal or female climacteric states    Syncope     Past Surgical History: Past Surgical History:  Procedure Laterality Date   ABDOMINAL HYSTERECTOMY  1993   Total   BUNIONECTOMY Bilateral 1993   ESOPHAGOGASTRODUODENOSCOPY (EGD) WITH PROPOFOL  N/A 05/13/2023   Procedure: ESOPHAGOGASTRODUODENOSCOPY (EGD) WITH PROPOFOL ;  Surgeon: Therisa Bi, MD;  Location: [facility] ENDOSCOPY;  Service: Gastroenterology;  Laterality: N/A;   I & D KNEE WITH POLY EXCHANGE Right 11/19/2017   Procedure: RIGHT KNEE POLY EXCHANGE WITH IRRIGATION AND DEBRIDEMENT;  Surgeon: Kathlynn Sharper, MD;  Location: [facility] OR[facility];  Service: Orthopedics;  Laterality: Right;   IR FLUORO GUIDED NEEDLE PLC ASPIRATION/INJECTION LOC  09/19/2021   JOINT REPLACEMENT  bilateral knee   LAMINECTOMY  11/14/2013    Cervical Fusion , [facility], Dr. Oma   LASER ABLATION Bilateral 07/29/2012   Dr. Marea   LOWER EXTREMITY ANGIOGRAPHY Right 11/02/2019   Procedure: Lower Extremity Angiography;  Surgeon: Jama Cordella MATSU, MD;  Location: [facility] INVASIVE CV LAB;  Service: Cardiovascular;  Laterality: Right;   ORIF ANKLE FRACTURE Right 09/02/2019   Procedure: OPEN REDUCTION INTERNAL FIXATION (ORIF) ANKLE FRACTURE BIMALLEOLAR;  Surgeon: Lennie Barter, DPM;   Location: [facility];  Service: Podiatry;  Laterality: Right;   PERIPHERAL VASCULAR THROMBECTOMY Right 03/02/2020   Procedure: PERIPHERAL VASCULAR THROMBECTOMY;  Surgeon: Marea Selinda RAMAN, MD;  Location: [facility] INVASIVE CV LAB;  Service: Cardiovascular;  Laterality: Right;   REPLACEMENT TOTAL KNEE Left 03/2010   Dr. Kathlynn   TEE WITHOUT CARDIOVERSION N/A 09/19/2021   Procedure: TRANSESOPHAGEAL ECHOCARDIOGRAM (TEE);  Surgeon: Hester Wolm PARAS, MD;  Location: [facility];  Service: Cardiovascular;  Laterality: N/A;   TOTAL HIP ARTHROPLASTY Left 12/11/2015   Procedure: TOTAL HIP ARTHROPLASTY ANTERIOR APPROACH;  Surgeon: Ozell Kathlynn, MD;  Location: [facility];  Service: Orthopedics;  Laterality: Left;   TOTAL KNEE ARTHROPLASTY Right 03/11/2016   Procedure: TOTAL KNEE ARTHROPLASTY;  Surgeon: Ozell Kathlynn, MD;  Location: [facility];  Service: Orthopedics;  Laterality: Right;    Allergies: Allergies as of 06/20/2024 - Review Complete 05/05/2024  Allergen Reaction Noted   Lisinopril Swelling 04/10/2015    Medications: Outpatient Encounter Medications as of 06/20/2024  Medication Sig   ACCU-CHEK GUIDE test strip TEST ONCE A DAY AS DIRECTED   acetaminophen  (TYLENOL ) 650 MG CR tablet Take 650-1,300 mg by mouth 2 (two) times daily as needed for pain.   amiodarone  (PACERONE ) 200 MG tablet Take 200 mg by mouth daily.   amoxicillin  (AMOXIL ) 500 MG capsule Take 1 capsule (500 mg total) by mouth 2 (two) times daily.   apixaban  (ELIQUIS ) 5 MG TABS tablet Take 1 tablet by mouth 2 (two) times daily.   atorvastatin  (LIPITOR) 40 MG tablet TAKE 1 TABLET BY MOUTH EVERY DAY IN THE EVENING   blood glucose meter kit and supplies 1 each by Other route daily at 12 noon. Dispense based on patient and insurance preference. Check fsbs once a day   Calcium  Carbonate-Vitamin D  600-400 MG-UNIT tablet Take 1 tablet by mouth 2 (two) times daily.   dapagliflozin  propanediol (FARXIGA ) 10 MG TABS tablet Take 1 tablet (10 mg total) by mouth daily  before breakfast. Patient receives via [employer] Patient Assistance   escitalopram  (LEXAPRO ) 5 MG tablet TAKE 1 TABLET (5 MG TOTAL) BY MOUTH DAILY.   ferrous sulfate  325 (65 FE) MG EC tablet TAKE 1 TABLET BY MOUTH EVERY DAY   fluticasone  (FLONASE ) 50 MCG/ACT nasal spray SPRAY 2 SPRAYS INTO EACH NOSTRIL EVERY DAY   hydroquinone 4 % cream Apply topically at bedtime as needed.   ketoconazole  (NIZORAL ) 2 % cream Apply 1 Application topically as needed for irritation.   latanoprost  (XALATAN ) 0.005 % ophthalmic solution Place 1 drop into both eyes at bedtime.   levothyroxine  (SYNTHROID ) 50 MCG tablet Take 1 tablet (50 mcg total) by mouth daily. Take two three times a week   Magnesium  Oxide 500 MG CAPS Take 500 mg by mouth daily.    metoprolol  succinate (TOPROL -XL) 50 MG 24 hr tablet Take 50 mg by mouth daily.   omeprazole  (PRILOSEC) 40 MG capsule TAKE 1 CAPSULE (40 MG TOTAL) BY MOUTH DAILY.   oxybutynin  (DITROPAN -XL) 10 MG 24 hr tablet Take 1 tablet (10 mg  total) by mouth daily.   oxyCODONE -acetaminophen  (PERCOCET/ROXICET) 5-325 MG tablet Take 1 tablet by mouth every 4 (four) hours as needed.   polyethylene glycol (MIRALAX  / GLYCOLAX ) 17 g packet Take 17 g by mouth daily. (Patient taking differently: Take 17 g by mouth every other day.)   potassium chloride  (KLOR-CON ) 10 MEQ tablet Take 10 mEq by mouth daily.   pregabalin  (LYRICA ) 50 MG capsule Take 1 capsule (50 mg total) by mouth every evening.   SIMBRINZA 1-0.2 % SUSP Apply 1 drop to eye 2 (two) times daily.   sodium chloride  (OCEAN) 0.65 % SOLN nasal spray Place 1 spray into both nostrils 3 (three) times daily.   torsemide  (DEMADEX ) 20 MG tablet TAKE 1 TABLET BY MOUTH EVERY DAY   traZODone  (DESYREL ) 50 MG tablet TAKE 1/2 TO 1 TABLET (25-50 MG TOTAL) BY MOUTH AT BEDTIME AS NEEDED. FOR SLEEP   No facility-administered encounter medications on file as of 06/20/2024.    Social History: Social History   Tobacco Use   Smoking status: Never    Passive  exposure: Never   Smokeless tobacco: Never  Vaping Use   Vaping status: Never Used  Substance Use Topics   Alcohol use: No    Alcohol/week: 0.0 standard drinks of alcohol   Drug use: No    Family Medical History: Family History  Problem Relation Age of Onset   Diabetes Mother    Hyperlipidemia Mother    Hypertension Mother    Diabetes Father    Hyperlipidemia Father    Hypertension Father    Obesity Father    Hypertension Sister    Hyperlipidemia Sister    Hyperlipidemia Sister    Breast cancer Neg Hx     Physical Examination: Vitals:   06/20/24 1112  BP: 108/76      Awake, alert, oriented to person, place, and time.  Speech is clear and fluent. Fund of knowledge is appropriate.   Cranial Nerves: Pupils equal round and reactive to light.  Facial tone is symmetric.    No abnormal lesions on exposed skin.   Strength: Side Biceps Triceps Deltoid Interossei Grip Wrist Ext. Wrist Flex.  R 5 5 Not tested 5 5 5 5   L 5 5 Not tested 5 5 5 5     Deltoid not tested due to pain.   She has limited ROM of both shoulders, left > right.   Reflexes are 2+ and symmetric at the biceps, brachioradialis.  Hoffman's is absent.    Bilateral upper extremity sensation is intact to light touch.     She is in a motorized scooter. Gait not tested.    Medical Decision Making  Imaging: No recent cervical imaging.   Assessment and Plan: [name] has a history of cervical surgery years ago (?fusion of occiput to C4). She improved after surgery.   Improvement in right sided neck pain after ESI and HHPT.   Her fusion looks good- difficult to tell on imaging if she is fused to C4 or C5 (reviewed with Dr. Claudene). She has cervical spondylosis and mild foraminal stenosis below her fusion. This may be contributing to her neck and right arm pain.    She has pain in both shoulders left > right that is worse with moving her arms. She saw ortho for her left shoulder- she has significant  deterioration on xray with an unstable joint.    Treatment options discussed with patient and following plan made:   - She will follow up with  [facility]/Dr. Avanell for repeat cervical ESI prn.  - Discussed getting cervical flex/ext xrays (at no charge- wrong views done x 2). Will hold for now as she is doing better. If we need to get these in the future, will do at no charge.  - Message to Dr. Kathlynn at her request- she is not sure plan for left shoulder. Asked to have his staff contact her.  - She will follow up with me prn for now.   I spent a total of 15 minutes in face-to-face and non-face-to-face activities related to this patient's care today including review of outside records, review of imaging, review of symptoms, physical exam, discussion of differential diagnosis, discussion of treatment options, and documentation.   Glade Boys PA-C Dept. of Neurosurgery

## 2024-06-20 ENCOUNTER — Ambulatory Visit: Admitting: Orthopedic Surgery

## 2024-06-20 ENCOUNTER — Encounter: Payer: Self-pay | Admitting: Orthopedic Surgery

## 2024-06-20 VITALS — BP 108/76 | Ht 71.0 in | Wt 240.0 lb

## 2024-06-20 DIAGNOSIS — Z981 Arthrodesis status: Secondary | ICD-10-CM

## 2024-06-20 DIAGNOSIS — M4802 Spinal stenosis, cervical region: Secondary | ICD-10-CM

## 2024-06-20 DIAGNOSIS — M47812 Spondylosis without myelopathy or radiculopathy, cervical region: Secondary | ICD-10-CM | POA: Diagnosis not present

## 2024-06-20 DIAGNOSIS — M5412 Radiculopathy, cervical region: Secondary | ICD-10-CM

## 2024-06-20 NOTE — Patient Instructions (Signed)
 It was so nice to see you today. Thank you so much for coming in.    I am glad you neck is feeling better. I would call [facility] if pain returns so you can get scheduled for another injection.   I would hold off on further xrays of your neck. We can get in the future if needed.   I sent a message to Dr. Kathlynn regarding plan for your left shoulder. His staff should be calling you.   Please do not hesitate to call if you have any questions or concerns. You can also message me in MyChart.   Glade Boys PA-C [phone]     The physicians and staff at [facility] are committed to providing excellent care. You may receive a survey asking for feedback about your experience at our office. We value you your feedback and appreciate you taking the time to to fill it out. The [facility] leadership team is also available to discuss your experience in person, feel free to contact us  [phone].

## 2024-06-23 ENCOUNTER — Ambulatory Visit: Payer: Medicare Other | Attending: Infectious Diseases | Admitting: Infectious Diseases

## 2024-06-23 ENCOUNTER — Encounter: Payer: Self-pay | Admitting: Infectious Diseases

## 2024-06-23 VITALS — BP 109/55 | HR 56 | Temp 98.0°F

## 2024-06-23 DIAGNOSIS — T8450XD Infection and inflammatory reaction due to unspecified internal joint prosthesis, subsequent encounter: Secondary | ICD-10-CM

## 2024-06-23 DIAGNOSIS — Z8619 Personal history of other infectious and parasitic diseases: Secondary | ICD-10-CM | POA: Diagnosis not present

## 2024-06-23 DIAGNOSIS — Z7984 Long term (current) use of oral hypoglycemic drugs: Secondary | ICD-10-CM | POA: Insufficient documentation

## 2024-06-23 DIAGNOSIS — E785 Hyperlipidemia, unspecified: Secondary | ICD-10-CM | POA: Diagnosis not present

## 2024-06-23 DIAGNOSIS — Z79890 Hormone replacement therapy (postmenopausal): Secondary | ICD-10-CM | POA: Diagnosis not present

## 2024-06-23 DIAGNOSIS — E1122 Type 2 diabetes mellitus with diabetic chronic kidney disease: Secondary | ICD-10-CM | POA: Diagnosis not present

## 2024-06-23 DIAGNOSIS — R7881 Bacteremia: Secondary | ICD-10-CM | POA: Insufficient documentation

## 2024-06-23 DIAGNOSIS — Z96653 Presence of artificial knee joint, bilateral: Secondary | ICD-10-CM | POA: Diagnosis not present

## 2024-06-23 DIAGNOSIS — N189 Chronic kidney disease, unspecified: Secondary | ICD-10-CM

## 2024-06-23 DIAGNOSIS — I129 Hypertensive chronic kidney disease with stage 1 through stage 4 chronic kidney disease, or unspecified chronic kidney disease: Secondary | ICD-10-CM | POA: Insufficient documentation

## 2024-06-23 DIAGNOSIS — Z96642 Presence of left artificial hip joint: Secondary | ICD-10-CM | POA: Diagnosis not present

## 2024-06-23 DIAGNOSIS — N183 Chronic kidney disease, stage 3 unspecified: Secondary | ICD-10-CM | POA: Diagnosis not present

## 2024-06-23 DIAGNOSIS — E039 Hypothyroidism, unspecified: Secondary | ICD-10-CM | POA: Diagnosis not present

## 2024-06-23 DIAGNOSIS — R600 Localized edema: Secondary | ICD-10-CM | POA: Diagnosis not present

## 2024-06-23 DIAGNOSIS — I4891 Unspecified atrial fibrillation: Secondary | ICD-10-CM | POA: Diagnosis not present

## 2024-06-23 DIAGNOSIS — Z7901 Long term (current) use of anticoagulants: Secondary | ICD-10-CM | POA: Insufficient documentation

## 2024-06-23 DIAGNOSIS — M009 Pyogenic arthritis, unspecified: Secondary | ICD-10-CM | POA: Diagnosis present

## 2024-06-23 DIAGNOSIS — B951 Streptococcus, group B, as the cause of diseases classified elsewhere: Secondary | ICD-10-CM | POA: Insufficient documentation

## 2024-06-23 DIAGNOSIS — Z23 Encounter for immunization: Secondary | ICD-10-CM

## 2024-06-23 DIAGNOSIS — M1611 Unilateral primary osteoarthritis, right hip: Secondary | ICD-10-CM | POA: Insufficient documentation

## 2024-06-23 DIAGNOSIS — Z79899 Other long term (current) drug therapy: Secondary | ICD-10-CM | POA: Diagnosis not present

## 2024-06-23 MED ORDER — AMOXICILLIN 500 MG PO CAPS: 500.0000 mg | ORAL_CAPSULE | Freq: Two times a day (BID) | ORAL | 12 refills | Status: AC

## 2024-06-23 NOTE — Progress Notes (Unsigned)
 NAME: Becky Trevino  DOB: [birth year]  MRN: [id number]  Date/Time: 06/23/2024 10:01 AM  Subjective:  Pt here for follow up annually  ?pt here for follow up visit- She has a h/o recurrent group B streptococcal bacteremia X 3 and rt knee prosthetic joint infection, rt hip septc arthritis - has been on suppressive amoxicillin  since  Becky Trevino is a 78 y.o. 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menz on 9/22 -planning for xray  She has no fever or chills  [name] is primarily wheelchair-bound but occasionally uses a walker. She is able to transfer from bed to wheelchair, go to the bathroom, and shower independently. She lives with her husband and daughter, who assist her as needed.  She also has chronic venous edema, for which she wears a wrap. She has tried other treatments, including compression stockings, but found them too tight and difficult to use.  Patient has a complicated Infection history    In 2017 had rt knee replacement   In February 2019 she had swelling of the knee and an aspiration revealed 59,000 WBCs with 98% polymorphs.  Cultures were negative as well as for crystal analysis.  Sed rate was 76.  On March 7 , 2019 she had an incision and drainage and polyethylene exchange of the right knee.  The cultures grew coag  negative staph.  PICC line was placed and she was given vancomycin  and ceftriaxone  for 6 weeks,  , culture had coag neg staph, was on suppressive doxy + rifampin because of retained prosthesis. Since  oct 2019  has been on Doxy suppressive therapy which was discontinued Jan 2023    08/07/19-08/09/19 after  she passed out at work and hit a metal pole She developed a left gluteal hematoma and 295 ml of dark purple blood aspirated by ortho on Dec , 11 2020 - no culture sent   Feb 2021- Group B strep bacteremia Rt ankle fracture ORIF Dec 2020-  non healing wound with GBS and acinetobacter in Feb 2021- treated with 4 weeks of Iv antibiotic- unasyn    09/02/20-09/14/20 for left upper thigh cellulitis, MRSA bacteremia and group B strep bacteremia and left lower lobe infiltrate due to MRSA She got IV vanco followed by Daptomycin  for a total of 22 days    07/16/21 -07/20/21 admitted for sepsis/SIRS- had epistaxis prior to admission no source identified. Blood culture neg  No pneumonia, no cellulitis, no Uti Chronic rt hip arthritis-pain- 7cc aspirated and cell count was initially reported as  73K - (37 L, 48M and 41% N) but repeat was 1300 and then 900 NO septic arthritis   09/14/21-09/24/21- hospitalized for fever and sepsis- GB strep bacteremia TEE 09/19/21  neg Rt hip aspiration -neg   Past Medical History:  Diagnosis Date   Cervical spondylosis    Chronic kidney disease  Stage 3   DDD (degenerative disc disease), cervical    [facility]   Diabetes mellitus without complication (HCC)    Diverticulosis    Hyperlipidemia    Hypertension    Intertrigo    Leg cramps    Leg varices    Obesity    OSA (obstructive sleep apnea)    Symptomatic menopausal or female climacteric states    Syncope     Past Surgical History:  Procedure Laterality Date   ABDOMINAL HYSTERECTOMY  1993   Total   BUNIONECTOMY Bilateral 1993   ESOPHAGOGASTRODUODENOSCOPY (EGD) WITH PROPOFOL  N/A 05/13/2023    Procedure: ESOPHAGOGASTRODUODENOSCOPY (EGD) WITH PROPOFOL ;  Surgeon: Therisa Bi, MD;  Location: [facility] ENDOSCOPY;  Service: Gastroenterology;  Laterality: N/A;   I & D KNEE WITH POLY EXCHANGE Right 11/19/2017   Procedure: RIGHT KNEE POLY EXCHANGE WITH IRRIGATION AND DEBRIDEMENT;  Surgeon: Kathlynn Sharper, MD;  Location: [facility] ORS;  Service: Orthopedics;  Laterality: Right;   IR FLUORO GUIDED NEEDLE PLC ASPIRATION/INJECTION LOC  09/19/2021   JOINT REPLACEMENT     bilateral knee   LAMINECTOMY  11/14/2013    Cervical Fusion , [facility], Dr. Oma   LASER ABLATION Bilateral 07/29/2012   Dr. Marea   LOWER EXTREMITY ANGIOGRAPHY Right 11/02/2019   Procedure: Lower Extremity Angiography;  Surgeon: Jama Cordella MATSU, MD;  Location: [facility] INVASIVE CV LAB;  Service: Cardiovascular;  Laterality: Right;   ORIF ANKLE FRACTURE Right 09/02/2019   Procedure: OPEN REDUCTION INTERNAL FIXATION (ORIF) ANKLE FRACTURE BIMALLEOLAR;  Surgeon: Lennie Barter, DPM;  Location: [facility] ORS;  Service: Podiatry;  Laterality: Right;   PERIPHERAL VASCULAR THROMBECTOMY Right 03/02/2020   Procedure: PERIPHERAL VASCULAR THROMBECTOMY;  Surgeon: Marea Selinda RAMAN, MD;  Location: [facility] INVASIVE CV LAB;  Service: Cardiovascular;  Laterality: Right;   REPLACEMENT TOTAL KNEE Left 03/2010   Dr. Kathlynn   TEE WITHOUT CARDIOVERSION N/A 09/19/2021   Procedure: TRANSESOPHAGEAL ECHOCARDIOGRAM (TEE);  Surgeon: Hester Wolm PARAS, MD;  Location: [facility] ORS;  Service: Cardiovascular;  Laterality: N/A;   TOTAL HIP ARTHROPLASTY Left 12/11/2015   Procedure: TOTAL HIP ARTHROPLASTY ANTERIOR APPROACH;  Surgeon: Sharper Kathlynn, MD;  Location: [facility] ORS;  Service: Orthopedics;  Laterality: Left;   TOTAL KNEE ARTHROPLASTY Right 03/11/2016   Procedure: TOTAL KNEE ARTHROPLASTY;  Surgeon: Sharper Kathlynn, MD;  Location: [facility] ORS;  Service: Orthopedics;  Laterality: Right;    Social History   Socioeconomic History   Marital status: Married    Spouse name: Anala Whisenant   Number of children: 1    Years of education: Not on file   Highest education level: Not on file  Occupational History   Occupation: Retired  Tobacco Use   Smoking status: Never    Passive exposure: Never   Smokeless tobacco: Never  Vaping Use   Vaping status: Never Used  Substance and Sexual Activity   Alcohol use: No    Alcohol/week: 0.0 standard drinks of alcohol   Drug use: No   Sexual activity: Yes    Partners: Male  Other Topics Concern   Not on file  Social History Narrative   Married, one adopted grown child    Social Drivers of Corporate investment banker Strain: Low Risk  (06/06/2024)   Received from [facility]   Overall Financial Resource Strain (CARDIA)    Difficulty of Paying Living Expenses: Not very hard  Food Insecurity: No Food Insecurity (06/06/2024)   Received from [facility]   Hunger Vital Sign  Within the past 12 months, you worried that your food would run out before you got the money to buy more.: Never true    Within the past 12 months, the food you bought just didn't last and you didn't have money to get more.: Never true  Transportation Needs: No Transportation Needs (06/06/2024)   Received from [facility] - Transportation    In the past 12 months, has lack of transportation kept you from medical appointments or from getting medications?: No    Lack of Transportation (Non-Medical): No  Physical Activity: Insufficiently Active (08/06/2023)   Exercise Vital Sign    Days of Exercise per Week: 5 days    Minutes of Exercise per Session: 20 min  Stress: No Stress Concern Present (08/06/2023)   [employer] of Occupational Health - Occupational Stress Questionnaire    Feeling of Stress : Not at all  Social Connections: Moderately Integrated (08/06/2023)   Social Connection and Isolation Panel    Frequency of Communication with Friends and Family: More than three times a week    Frequency of Social  Gatherings with Friends and Family: Once a week    Attends Religious Services: More than 4 times per year    Active Member of [employer] or Organizations: No    Attends Banker Meetings: Never    Marital Status: Married  Catering manager Violence: Not At Risk (08/17/2023)   Humiliation, Afraid, Rape, and Kick questionnaire    Fear of Current or Ex-Partner: No    Emotionally Abused: No    Physically Abused: No    Sexually Abused: No    Family History  Problem Relation Age of Onset   Diabetes Mother    Hyperlipidemia Mother    Hypertension Mother    Diabetes Father    Hyperlipidemia Father    Hypertension Father    Obesity Father    Hypertension Sister    Hyperlipidemia Sister    Hyperlipidemia Sister    Breast cancer Neg Hx    Allergies  Allergen Reactions   Lisinopril Swelling   I? Current Outpatient Medications  Medication Sig Dispense Refill   ACCU-CHEK GUIDE test strip TEST ONCE A DAY AS DIRECTED 50 strip 1   acetaminophen  (TYLENOL ) 650 MG CR tablet Take 650-1,300 mg by mouth 2 (two) times daily as needed for pain.     amiodarone  (PACERONE ) 200 MG tablet Take 200 mg by mouth daily.     amoxicillin  (AMOXIL ) 500 MG capsule Take 1 capsule (500 mg total) by mouth 2 (two) times daily. 60 capsule 12   apixaban  (ELIQUIS ) 5 MG TABS tablet Take 1 tablet by mouth 2 (two) times daily.     atorvastatin  (LIPITOR) 40 MG tablet TAKE 1 TABLET BY MOUTH EVERY DAY IN THE EVENING 90 tablet 1   blood glucose meter kit and supplies 1 each by Other route daily at 12 noon. Dispense based on patient and insurance preference. Check fsbs once a day 1 each 0   Calcium  Carbonate-Vitamin D  600-400 MG-UNIT tablet Take 1 tablet by mouth 2 (two) times daily. 60 tablet 0   dapagliflozin  propanediol (FARXIGA ) 10 MG TABS tablet Take 1 tablet (10 mg total) by mouth daily before breakfast. Patient receives via [place] Patient Assistance 90 tablet 3   escitalopram  (LEXAPRO ) 5 MG tablet TAKE 1 TABLET (5 MG  TOTAL) BY MOUTH DAILY. 90 tablet 1   ferrous sulfate  325 (65 FE) MG EC tablet TAKE 1 TABLET BY MOUTH  EVERY DAY 90 tablet 3   fluticasone  (FLONASE ) 50 MCG/ACT nasal spray SPRAY 2 SPRAYS INTO EACH NOSTRIL EVERY DAY 48 mL 0   hydroquinone 4 % cream Apply topically at bedtime as needed.     ketoconazole  (NIZORAL ) 2 % cream Apply 1 Application topically as needed for irritation.     latanoprost  (XALATAN ) 0.005 % ophthalmic solution Place 1 drop into both eyes at bedtime.     levothyroxine  (SYNTHROID ) 50 MCG tablet Take 1 tablet (50 mcg total) by mouth daily. Take two three times a week 100 tablet 0   Magnesium  Oxide 500 MG CAPS Take 500 mg by mouth daily.      metoprolol  succinate (TOPROL -XL) 50 MG 24 hr tablet Take 50 mg by mouth daily.     omeprazole  (PRILOSEC) 40 MG capsule TAKE 1 CAPSULE (40 MG TOTAL) BY MOUTH DAILY. 30 capsule 0   oxybutynin  (DITROPAN -XL) 10 MG 24 hr tablet Take 1 tablet (10 mg total) by mouth daily. 90 tablet 3   oxyCODONE -acetaminophen  (PERCOCET/ROXICET) 5-325 MG tablet Take 1 tablet by mouth every 4 (four) hours as needed.     polyethylene glycol (MIRALAX  / GLYCOLAX ) 17 g packet Take 17 g by mouth daily. (Patient taking differently: Take 17 g by mouth every other day.) 14 each 0   potassium chloride  (KLOR-CON ) 10 MEQ tablet Take 10 mEq by mouth daily.     pregabalin  (LYRICA ) 50 MG capsule Take 1 capsule (50 mg total) by mouth every evening. 30 capsule 0   SIMBRINZA 1-0.2 % SUSP Apply 1 drop to eye 2 (two) times daily.     sodium chloride  (OCEAN) 0.65 % SOLN nasal spray Place 1 spray into both nostrils 3 (three) times daily. 30 mL 0   torsemide  (DEMADEX ) 20 MG tablet TAKE 1 TABLET BY MOUTH EVERY DAY 90 tablet 1   traZODone  (DESYREL ) 50 MG tablet TAKE 1/2 TO 1 TABLET (25-50 MG TOTAL) BY MOUTH AT BEDTIME AS NEEDED. FOR SLEEP 90 tablet 0   No current facility-administered medications for this visit.     Abtx:  Anti-infectives (From admission, onward)    None        REVIEW OF SYSTEMS:  Const: negative fever, negative chills, negative weight loss Eyes: negative diplopia or visual changes, negative eye pain ENT: negative coryza, negative sore throat Resp: negative cough, hemoptysis, dyspnea Cards: negative for chest pain, palpitations, lower extremity edema GU: negative for frequency, dysuria and hematuria GI: Negative for abdominal pain, diarrhea, bleeding, constipation Skin: negative for rash and pruritus Heme: negative for easy bruising and gum/nose bleeding MS: decreased mobility because of Joint disease Neurolo:negative for headaches, dizziness, vertigo, memory problems  Psych: negative for feelings of anxiety, depression  Endocrine:   diabetes Allergy/Immunology- as above ? Objective:  VITALS:  BP (!) 109/55   Pulse (!) 56   Temp 98 F (36.7 C) (Temporal)   PHYSICAL EXAM:  General: Alert, cooperative, no distress, appears stated age. In wheel chair HEENT: Dentures present in both the upper and lower jaws. Eyes appear normal. CHEST: Lungs clear to auscultation. CARDIOVASCULAR: Heart rhythm steady and regular. ABDOMEN: No tenderness on palpation. MUSCULOSKELETAL: Scar tissue on knees with slight swelling rt knee but no redness or tenderness. Hip area shows slight swelling. Extremities: has compression wrap, edema legs Skin:limited examination  No rashes or lesions. Or bruising Lymph: Cervical, supraclavicular normal. Neurologic: Grossly non-focal   Pertinent Labs LABS Creatinine: 1.43 (02/2023) Hb: Normal (12/2022)   Impression/Recommendation Prosthetic Joint Infections No recent infections or  hospitalizations. Stable on Amoxicillin  prophylaxis. -Continue Amoxicillin   500mg  BID indefinitely.  Followed by Dr.menz for rt knee swelling- will need aspiration if it worsens or before steroids to look for Cell count, crystals and culture   Chronic Kidney Disease Stable creatinine at 1.43. -Continue current  management.  Venous Edema Managed with leg wrap. -Continue current management.   H/o ? 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 ___________________________________________________ Discussed with patient, in detail Note:  This document was prepared using Dragon voice recognition software and may include unintentional dictation errors.

## 2024-06-27 ENCOUNTER — Other Ambulatory Visit: Payer: Self-pay | Admitting: Internal Medicine

## 2024-06-27 DIAGNOSIS — E1140 Type 2 diabetes mellitus with diabetic neuropathy, unspecified: Secondary | ICD-10-CM

## 2024-06-29 ENCOUNTER — Other Ambulatory Visit: Payer: Self-pay | Admitting: *Deleted

## 2024-06-29 DIAGNOSIS — D631 Anemia in chronic kidney disease: Secondary | ICD-10-CM

## 2024-06-29 NOTE — Telephone Encounter (Signed)
 Requested medication (s) are due for refill today: yes  Requested medication (s) are on the active medication list: yes  Last refill:  04/18/24  Future visit scheduled: yes  Notes to clinic:  Unable to refill per protocol, cannot delegate.      Requested Prescriptions  Pending Prescriptions Disp Refills   pregabalin  (LYRICA ) 50 MG capsule [Pharmacy Med Name: PREGABALIN  50 MG CAPSULE] 30 capsule 0    Sig: Take 1 capsule (50 mg total) by mouth every evening.     Not Delegated - Neurology:  Anticonvulsants - Controlled - pregabalin  Failed - 06/29/2024  3:00 PM      Failed - This refill cannot be delegated      Failed - Cr in normal range and within 360 days    Creat  Date Value Ref Range Status  03/02/2024 1.33 (H) 0.60 - 1.00 mg/dL Final   Creatinine, Urine  Date Value Ref Range Status  03/04/2024 75 20 - 275 mg/dL Final         Passed - Completed PHQ-2 or PHQ-9 in the last 360 days      Passed - Valid encounter within last 12 months    Recent Outpatient Visits           1 month ago Hypothyroidism (acquired)   [facility] Glenard Mire, MD   3 months ago Type 2 diabetes mellitus with diabetic neuropathy, without long-term current use of insulin  [facility])   [facility] [place], Mire, MD   8 months ago AF (paroxysmal atrial fibrillation) [facility])   [facility] Sowles, Krichna, MD       Future Appointments             In 1 week [name], Clotilda DELENA RIGGERS [facility] [place]   In 11 months Fayette Bodily, MD [facility]

## 2024-06-30 ENCOUNTER — Inpatient Hospital Stay: Attending: Oncology

## 2024-06-30 DIAGNOSIS — N183 Chronic kidney disease, stage 3 unspecified: Secondary | ICD-10-CM | POA: Insufficient documentation

## 2024-06-30 DIAGNOSIS — I480 Paroxysmal atrial fibrillation: Secondary | ICD-10-CM | POA: Insufficient documentation

## 2024-06-30 DIAGNOSIS — D631 Anemia in chronic kidney disease: Secondary | ICD-10-CM | POA: Insufficient documentation

## 2024-06-30 DIAGNOSIS — Z7901 Long term (current) use of anticoagulants: Secondary | ICD-10-CM | POA: Insufficient documentation

## 2024-06-30 DIAGNOSIS — Z86718 Personal history of other venous thrombosis and embolism: Secondary | ICD-10-CM | POA: Insufficient documentation

## 2024-06-30 LAB — CBC WITH DIFFERENTIAL (CANCER CENTER ONLY)
Abs Immature Granulocytes: 0.04 K/uL (ref 0.00–0.07)
Basophils Absolute: 0 K/uL (ref 0.0–0.1)
Basophils Relative: 1 %
Eosinophils Absolute: 0.2 K/uL (ref 0.0–0.5)
Eosinophils Relative: 4 %
HCT: 38.4 % (ref 36.0–46.0)
Hemoglobin: 12.3 g/dL (ref 12.0–15.0)
Immature Granulocytes: 1 %
Lymphocytes Relative: 21 %
Lymphs Abs: 1.3 K/uL (ref 0.7–4.0)
MCH: 31.3 pg (ref 26.0–34.0)
MCHC: 32 g/dL (ref 30.0–36.0)
MCV: 97.7 fL (ref 80.0–100.0)
Monocytes Absolute: 0.6 K/uL (ref 0.1–1.0)
Monocytes Relative: 9 %
Neutro Abs: 3.9 K/uL (ref 1.7–7.7)
Neutrophils Relative %: 64 %
Platelet Count: 184 K/uL (ref 150–400)
RBC: 3.93 MIL/uL (ref 3.87–5.11)
RDW: 17.4 % — ABNORMAL HIGH (ref 11.5–15.5)
WBC Count: 6 K/uL (ref 4.0–10.5)
nRBC: 0 % (ref 0.0–0.2)

## 2024-06-30 LAB — IRON AND TIBC
Iron: 47 ug/dL (ref 28–170)
Saturation Ratios: 22 % (ref 10.4–31.8)
TIBC: 218 ug/dL — ABNORMAL LOW (ref 250–450)
UIBC: 171 ug/dL

## 2024-06-30 LAB — RETIC PANEL
Immature Retic Fract: 21.4 % — ABNORMAL HIGH (ref 2.3–15.9)
RBC.: 3.9 MIL/uL (ref 3.87–5.11)
Retic Count, Absolute: 76.1 K/uL (ref 19.0–186.0)
Retic Ct Pct: 2 % (ref 0.4–3.1)
Reticulocyte Hemoglobin: 32.5 pg (ref >27.9)

## 2024-06-30 LAB — FERRITIN: Ferritin: 185 ng/mL (ref 11–307)

## 2024-07-04 ENCOUNTER — Ambulatory Visit: Admitting: Family Medicine

## 2024-07-04 ENCOUNTER — Ambulatory Visit: Admitting: Oncology

## 2024-07-04 ENCOUNTER — Encounter: Payer: Self-pay | Admitting: Family Medicine

## 2024-07-04 VITALS — BP 124/72 | HR 58 | Resp 16 | Ht 71.0 in | Wt 271.4 lb

## 2024-07-04 DIAGNOSIS — I480 Paroxysmal atrial fibrillation: Secondary | ICD-10-CM | POA: Diagnosis not present

## 2024-07-04 DIAGNOSIS — I82541 Chronic embolism and thrombosis of right tibial vein: Secondary | ICD-10-CM

## 2024-07-04 DIAGNOSIS — E1140 Type 2 diabetes mellitus with diabetic neuropathy, unspecified: Secondary | ICD-10-CM | POA: Diagnosis not present

## 2024-07-04 DIAGNOSIS — E039 Hypothyroidism, unspecified: Secondary | ICD-10-CM

## 2024-07-04 DIAGNOSIS — I739 Peripheral vascular disease, unspecified: Secondary | ICD-10-CM

## 2024-07-04 DIAGNOSIS — G4733 Obstructive sleep apnea (adult) (pediatric): Secondary | ICD-10-CM

## 2024-07-04 DIAGNOSIS — Z7984 Long term (current) use of oral hypoglycemic drugs: Secondary | ICD-10-CM

## 2024-07-04 DIAGNOSIS — G8929 Other chronic pain: Secondary | ICD-10-CM

## 2024-07-04 DIAGNOSIS — I700 Atherosclerosis of aorta: Secondary | ICD-10-CM

## 2024-07-04 DIAGNOSIS — I272 Pulmonary hypertension, unspecified: Secondary | ICD-10-CM

## 2024-07-04 DIAGNOSIS — I10 Essential (primary) hypertension: Secondary | ICD-10-CM

## 2024-07-04 DIAGNOSIS — F419 Anxiety disorder, unspecified: Secondary | ICD-10-CM

## 2024-07-04 DIAGNOSIS — R0989 Other specified symptoms and signs involving the circulatory and respiratory systems: Secondary | ICD-10-CM

## 2024-07-04 DIAGNOSIS — E669 Obesity, unspecified: Secondary | ICD-10-CM

## 2024-07-04 DIAGNOSIS — E119 Type 2 diabetes mellitus without complications: Secondary | ICD-10-CM

## 2024-07-04 DIAGNOSIS — M009 Pyogenic arthritis, unspecified: Secondary | ICD-10-CM

## 2024-07-04 LAB — POCT GLYCOSYLATED HEMOGLOBIN (HGB A1C): Hemoglobin A1C: 5.7 % — AB (ref 4.0–5.6)

## 2024-07-04 LAB — COMPREHENSIVE METABOLIC PANEL WITH GFR
AG Ratio: 0.8 (calc) — ABNORMAL LOW (ref 1.0–2.5)
ALT: 15 U/L (ref 6–29)
AST: 26 U/L (ref 10–35)
Albumin: 3.4 g/dL — ABNORMAL LOW (ref 3.6–5.1)
Alkaline phosphatase (APISO): 88 U/L (ref 37–153)
BUN/Creatinine Ratio: 12 (calc) (ref 6–22)
BUN: 16 mg/dL (ref 7–25)
CO2: 28 mmol/L (ref 20–32)
Calcium: 9 mg/dL (ref 8.6–10.4)
Chloride: 105 mmol/L (ref 98–110)
Creat: 1.3 mg/dL — ABNORMAL HIGH (ref 0.60–1.00)
Globulin: 4.1 g/dL — ABNORMAL HIGH (ref 1.9–3.7)
Glucose, Bld: 84 mg/dL (ref 65–99)
Potassium: 4.7 mmol/L (ref 3.5–5.3)
Sodium: 141 mmol/L (ref 135–146)
Total Bilirubin: 0.5 mg/dL (ref 0.2–1.2)
Total Protein: 7.5 g/dL (ref 6.1–8.1)
eGFR: 42 mL/min/1.73m2 — ABNORMAL LOW (ref >=60)

## 2024-07-04 LAB — TSH: TSH: 4.27 m[IU]/L (ref 0.40–4.50)

## 2024-07-04 MED ORDER — PREGABALIN 50 MG PO CAPS: 50.0000 mg | ORAL_CAPSULE | Freq: Every evening | ORAL | 0 refills | Status: DC

## 2024-07-04 NOTE — Progress Notes (Signed)
 Name: Becky Trevino   MRN: [id number]    DOB: [birth year]   Date:07/04/2024       Progress Note  Subjective  Chief Complaint  Chief Complaint  Patient presents with   Medical Management of Chronic Issues   Discussed the use of AI scribe software for clinical note transcription with the patient, who gave verbal consent to proceed.  History of Present Illness Becky Trevino is a 78 year old female with chronic knee infection and obesity who presents for follow-up on weight management and chronic pain.  She has experienced a weight gain from 255 pounds in June to 271.4 pounds currently, which she attributes to potential changes in activity and diet. Despite this, she continues to walk using a walker at home.  She has a chronic infection in her right knee, which is painful and rated at a 9 out of 10. She is currently taking oxycodone  for pain management and has been prescribed steroids in the past for inflammation, which she found helpful. She continues to see an infectious disease doctor and is on amoxicillin  for the infection.  She has a history of paroxysmal atrial fibrillation, pulmonary hypertension, and SVT, for which she is taking Eliquis  5 mg twice daily, amiodarone , and metoprolol  50 mg. No recent episodes of syncope, and she is managing her condition with regular follow-ups with her cardiologist.  She has type 2 diabetes with complications including neuropathy, obesity, hypertension, and dyslipidemia. Her most recent A1c was 5.7, and she is on Farxiga  10 mg. She takes atorvastatin  40 mg for hyperlipidemia and reports no side effects from her medications.  She experiences chronic pain due to neuropathy and is taking Lyrica  (pregabalin ) 50 mg at night, which she finds effective for pain control. She is also on levothyroxine  50 mcg for hypothyroidism.  She uses a CPAP machine for sleep apnea, although there have been issues with approval and follow-up. She is also managing lymphedema  with compression wraps, although she finds them challenging to use due to the size of her knee.    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) 12/01/2022   Interstitial lung disease (HCC) 12/01/2022   Atherosclerosis of native arteries of extremity with intermittent claudication 10/16/2022   Anemia due to chronic kidney disease 08/26/2022   Hypothyroidism (acquired) 01/27/2022   PAD (peripheral artery disease) 09/27/2021   AF (paroxysmal atrial fibrillation) (HCC) 09/15/2021   Primary osteoarthritis of right hip 07/18/2021   Atherosclerosis of aorta 06/07/2021   Mild protein-calorie malnutrition    Hyperbilirubinemia    Injury of left femoral nerve 08/28/2020   Lymphedema 06/25/2020   History of pelvic hematoma 02/10/2020   History of deep vein thrombosis 10/30/2019   Normocytic anemia 08/07/2019   Chronic infection of right knee (HCC) 11/25/2018   Infection and inflammatory reaction due to internal joint prosthesis, subsequent encounter 01/25/2018   Infection of total right knee replacement 11/19/2017   Type 2 diabetes mellitus with stage 3b chronic kidney disease, without long-term current use of insulin  (HCC) 11/06/2016   Primary osteoarthritis of knee 03/11/2016   Primary osteoarthritis of left hip 12/11/2015   Essential hypertension 04/21/2015  Bilateral lower extremity edema 04/21/2015   Chronic kidney disease (CKD), stage III (moderate) (HCC) 04/21/2015   Chronic venous insufficiency 04/21/2015   DDD (degenerative disc disease), cervical 04/21/2015   Diabetes mellitus with renal manifestation (HCC) 04/21/2015   Dyslipidemia  04/21/2015   Bladder cystocele 04/21/2015   Urinary incontinence 04/21/2015   Leg varices 04/21/2015   Insomnia 04/21/2015   Eczema intertrigo 04/21/2015   Morbid obesity (HCC) 04/21/2015   OSA (obstructive sleep apnea) 04/21/2015   Osteoarthritis of multiple joints 04/21/2015   H/O Spinal surgery 11/14/2013   Detrusor muscle hypertonia 11/07/2013    Past Surgical History:  Procedure Laterality Date   ABDOMINAL HYSTERECTOMY  1993   Total   BUNIONECTOMY Bilateral 1993   ESOPHAGOGASTRODUODENOSCOPY (EGD) WITH PROPOFOL  N/A 05/13/2023   Procedure: ESOPHAGOGASTRODUODENOSCOPY (EGD) WITH PROPOFOL ;  Surgeon: Therisa Bi, MD;  Location: [facility];  Service: Gastroenterology;  Laterality: N/A;   I & D KNEE WITH POLY EXCHANGE Right 11/19/2017   Procedure: RIGHT KNEE POLY EXCHANGE WITH IRRIGATION AND DEBRIDEMENT;  Surgeon: Kathlynn Sharper, MD;  Location: [facility] OR[facility];  Service: Orthopedics;  Laterality: Right;   IR FLUORO GUIDED NEEDLE PLC ASPIRATION/INJECTION LOC  09/19/2021   JOINT REPLACEMENT     bilateral knee   LAMINECTOMY  11/14/2013    Cervical Fusion , [facility], Dr. Oma   LASER ABLATION Bilateral 07/29/2012   Dr. Marea   LOWER EXTREMITY ANGIOGRAPHY Right 11/02/2019   Procedure: Lower Extremity Angiography;  Surgeon: Jama Cordella MATSU, MD;  Location: [facility];  Service: Cardiovascular;  Laterality: Right;   ORIF ANKLE FRACTURE Right 09/02/2019   Procedure: OPEN REDUCTION INTERNAL FIXATION (ORIF) ANKLE FRACTURE BIMALLEOLAR;  Surgeon: Lennie Barter, DPM;  Location: [facility];  Service: Podiatry;  Laterality: Right;   PERIPHERAL VASCULAR THROMBECTOMY Right 03/02/2020   Procedure: PERIPHERAL VASCULAR THROMBECTOMY;  Surgeon: Marea Selinda RAMAN, MD;  Location: [facility];  Service: Cardiovascular;  Laterality: Right;   REPLACEMENT TOTAL KNEE Left 03/2010   Dr. Kathlynn   TEE WITHOUT CARDIOVERSION N/A 09/19/2021   Procedure: TRANSESOPHAGEAL ECHOCARDIOGRAM (TEE);  Surgeon: Hester Wolm PARAS, MD;   Location: [facility];  Service: Cardiovascular;  Laterality: N/A;   TOTAL HIP ARTHROPLASTY Left 12/11/2015   Procedure: TOTAL HIP ARTHROPLASTY ANTERIOR APPROACH;  Surgeon: Sharper Kathlynn, MD;  Location: [facility];  Service: Orthopedics;  Laterality: Left;   TOTAL KNEE ARTHROPLASTY Right 03/11/2016   Procedure: TOTAL KNEE ARTHROPLASTY;  Surgeon: Sharper Kathlynn, MD;  Location: [facility];  Service: Orthopedics;  Laterality: Right;    Family History  Problem Relation Age of Onset   Diabetes Mother    Hyperlipidemia Mother    Hypertension Mother    Diabetes Father    Hyperlipidemia Father    Hypertension Father    Obesity Father    Hypertension Sister    Hyperlipidemia Sister    Hyperlipidemia Sister    Breast cancer Neg Hx     Social History   Tobacco Use   Smoking status: Never    Passive exposure: Never   Smokeless tobacco: Never  Substance Use Topics   Alcohol use: No    Alcohol/week: 0.0 standard drinks of alcohol     Current Outpatient Medications:    ACCU-CHEK GUIDE test strip, TEST ONCE A DAY AS DIRECTED, Disp: 50 strip, Rfl: 1   acetaminophen  (TYLENOL ) 650 MG CR tablet, Take 650-1,300 mg by mouth 2 (two) times daily as needed for pain., Disp: , Rfl:    amiodarone  (PACERONE ) 200 MG tablet, Take 200  mg by mouth daily., Disp: , Rfl:    amoxicillin  (AMOXIL ) 500 MG capsule, Take 1 capsule (500 mg total) by mouth 2 (two) times daily., Disp: 60 capsule, Rfl: 12   apixaban  (ELIQUIS ) 5 MG TABS tablet, Take 1 tablet by mouth 2 (two) times daily., Disp: , Rfl:    atorvastatin  (LIPITOR) 40 MG tablet, TAKE 1 TABLET BY MOUTH EVERY DAY IN THE EVENING, Disp: 90 tablet, Rfl: 1   blood glucose meter kit and supplies, 1 each by Other route daily at 12 noon. Dispense based on patient and insurance preference. Check fsbs once a day, Disp: 1 each, Rfl: 0   Calcium  Carbonate-Vitamin D  600-400 MG-UNIT tablet, Take 1 tablet by mouth 2 (two) times daily., Disp: 60 tablet, Rfl: 0   dapagliflozin   propanediol (FARXIGA ) 10 MG TABS tablet, Take 1 tablet (10 mg total) by mouth daily before breakfast. Patient receives via [employer] Patient Assistance, Disp: 90 tablet, Rfl: 3   escitalopram  (LEXAPRO ) 5 MG tablet, TAKE 1 TABLET (5 MG TOTAL) BY MOUTH DAILY., Disp: 90 tablet, Rfl: 1   ferrous sulfate  325 (65 FE) MG EC tablet, TAKE 1 TABLET BY MOUTH EVERY DAY, Disp: 90 tablet, Rfl: 3   fluticasone  (FLONASE ) 50 MCG/ACT nasal spray, SPRAY 2 SPRAYS INTO EACH NOSTRIL EVERY DAY, Disp: 48 mL, Rfl: 0   hydroquinone 4 % cream, Apply topically at bedtime as needed., Disp: , Rfl:    ketoconazole  (NIZORAL ) 2 % cream, Apply 1 Application topically as needed for irritation., Disp: , Rfl:    latanoprost  (XALATAN ) 0.005 % ophthalmic solution, Place 1 drop into both eyes at bedtime., Disp: , Rfl:    levothyroxine  (SYNTHROID ) 50 MCG tablet, Take 1 tablet (50 mcg total) by mouth daily. Take two three times a week, Disp: 100 tablet, Rfl: 0   Magnesium  Oxide 500 MG CAPS, Take 500 mg by mouth daily. , Disp: , Rfl:    metoprolol  succinate (TOPROL -XL) 50 MG 24 hr tablet, Take 50 mg by mouth daily., Disp: , Rfl:    omeprazole  (PRILOSEC) 40 MG capsule, TAKE 1 CAPSULE (40 MG TOTAL) BY MOUTH DAILY., Disp: 30 capsule, Rfl: 0   oxybutynin  (DITROPAN -XL) 10 MG 24 hr tablet, Take 1 tablet (10 mg total) by mouth daily., Disp: 90 tablet, Rfl: 3   oxyCODONE -acetaminophen  (PERCOCET/ROXICET) 5-325 MG tablet, Take 1 tablet by mouth every 4 (four) hours as needed., Disp: , Rfl:    polyethylene glycol (MIRALAX  / GLYCOLAX ) 17 g packet, Take 17 g by mouth daily. (Patient taking differently: Take 17 g by mouth every other day.), Disp: 14 each, Rfl: 0   potassium chloride  (KLOR-CON ) 10 MEQ tablet, Take 10 mEq by mouth daily., Disp: , Rfl:    pregabalin  (LYRICA ) 50 MG capsule, TAKE 1 CAPSULE (50 MG TOTAL) BY MOUTH EVERY EVENING., Disp: 90 capsule, Rfl: 0   SIMBRINZA 1-0.2 % SUSP, Apply 1 drop to eye 2 (two) times daily., Disp: , Rfl:    sodium  chloride (OCEAN) 0.65 % SOLN nasal spray, Place 1 spray into both nostrils 3 (three) times daily., Disp: 30 mL, Rfl: 0   torsemide  (DEMADEX ) 20 MG tablet, TAKE 1 TABLET BY MOUTH EVERY DAY, Disp: 90 tablet, Rfl: 1   traZODone  (DESYREL ) 50 MG tablet, TAKE 1/2 TO 1 TABLET (25-50 MG TOTAL) BY MOUTH AT BEDTIME AS NEEDED. FOR SLEEP, Disp: 90 tablet, Rfl: 0  Allergies  Allergen Reactions   Lisinopril Swelling    I personally reviewed active problem list, medication list, allergies, family history  with the patient/caregiver today.   ROS  Ten systems reviewed and is negative except as mentioned in HPI    Objective Physical Exam  CONSTITUTIONAL: Patient appears well-developed and well-nourished. No distress. HEENT: Head atraumatic, normocephalic, neck supple. CARDIOVASCULAR: Normal rate, regular rhythm and normal heart sounds. No murmur heard. No BLE edema. PULMONARY: Effort normal. Bibasilar crackles present. ABDOMINAL: There is no tenderness or distention. MUSCULOSKELETAL: Normal gait. Without gross motor or sensory deficit. PSYCHIATRIC: Patient has a normal mood and affect. Behavior is normal. Judgment and thought content normal.  Vitals:   07/04/24 1050  BP: 124/72  Pulse: (!) 58  Resp: 16  SpO2: 97%  Weight: 271 lb 6.4 oz (123.1 kg)  Height: 5' 11 (1.803 m)    Body mass index is 37.85 kg/m.  Recent Results (from the past 2160 hours)  Retic Panel     Status: Abnormal   Collection Time: 06/30/24 10:22 AM  Result Value Ref Range   Retic Ct Pct 2.0 0.4 - 3.1 %   RBC. 3.90 3.87 - 5.11 MIL/uL   Retic Count, Absolute 76.1 19.0 - 186.0 K/uL   Immature Retic Fract 21.4 (H) 2.3 - 15.9 %   Reticulocyte Hemoglobin 32.5 >27.9 pg    Comment:        Given the high negative predictive value of a RET-He result > 32 pg iron  deficiency is essentially excluded. If this patient is anemic other etiologies should be considered. Performed at [facility], [address]  [place], [place] [address]   Ferritin     Status: None   Collection Time: 06/30/24 10:22 AM  Result Value Ref Range   Ferritin 185 11 - 307 ng/mL    Comment: Performed at [facility], [address] [place], [place] [address]  Iron  and TIBC     Status: Abnormal   Collection Time: 06/30/24 10:22 AM  Result Value Ref Range   Iron  47 28 - 170 ug/dL   TIBC 781 (L) 749 - 549 ug/dL   Saturation Ratios 22 10.4 - 31.8 %   UIBC 171 ug/dL    Comment: Performed at [facility], [address] [place], [place] [address]  CBC with Differential (Cancer Center Only)     Status: Abnormal   Collection Time: 06/30/24 10:22 AM  Result Value Ref Range   WBC Count 6.0 4.0 - 10.5 K/uL   RBC 3.93 3.87 - 5.11 MIL/uL   Hemoglobin 12.3 12.0 - 15.0 g/dL   HCT 61.5 63.9 - 53.9 %   MCV 97.7 80.0 - 100.0 fL   MCH 31.3 26.0 - 34.0 pg   MCHC 32.0 30.0 - 36.0 g/dL   RDW 82.5 (H) 88.4 - 84.4 %   Platelet Count 184 150 - 400 K/uL   nRBC 0.0 0.0 - 0.2 %   Neutrophils Relative % 64 %   Neutro Abs 3.9 1.7 - 7.7 K/uL   Lymphocytes Relative 21 %   Lymphs Abs 1.3 0.7 - 4.0 K/uL   Monocytes Relative 9 %   Monocytes Absolute 0.6 0.1 - 1.0 K/uL   Eosinophils Relative 4 %   Eosinophils Absolute 0.2 0.0 - 0.5 K/uL   Basophils Relative 1 %   Basophils Absolute 0.0 0.0 - 0.1 K/uL   Immature Granulocytes 1 %   Abs Immature Granulocytes 0.04 0.00 - 0.07 K/uL    Comment: Performed at [facility], [address] [place], [place] [address]  POCT glycosylated hemoglobin (Hb A1C)  Status: Abnormal   Collection Time: 07/04/24 10:57 AM  Result Value Ref Range   Hemoglobin A1C 5.7 (A) 4.0 - 5.6 %   HbA1c POC (<> result, manual entry)     HbA1c, POC (prediabetic range)     HbA1c, POC (controlled diabetic range)        PHQ2/9:    07/04/2024   10:41 AM 05/05/2024   10:00 AM 03/02/2024    9:52 AM 09/22/2023   12:43 PM 08/06/2023    9:53 AM  Depression screen PHQ 2/9  Decreased Interest 0 0 0  0 0  Down, Depressed, Hopeless 0 0 0 0 0  PHQ - 2 Score 0 0 0 0 0  Altered sleeping 0 0  0 0  Tired, decreased energy 0 1  0 0  Change in appetite 0 0  0 0  Feeling bad or failure about yourself  0 0  0 0  Trouble concentrating 0 0  0 0  Moving slowly or fidgety/restless 0 0  0 0  Suicidal thoughts 0 0  0 0  PHQ-9 Score 0 1  0 0  Difficult doing work/chores Not difficult at all Not difficult at all  Not difficult at all     phq 9 is negative  Fall Risk:    07/04/2024   10:40 AM 06/23/2024    9:31 AM 05/05/2024   10:00 AM 03/02/2024    9:52 AM 09/22/2023   12:43 PM  Fall Risk   Falls in the past year? 1 1 0 1 0  Number falls in past yr: 0 0 0 0 0  Injury with Fall? 0  0 0 0  Risk for fall due to : No Fall Risks Impaired mobility  Impaired balance/gait No Fall Risks  Follow up Falls evaluation completed Falls evaluation completed Falls evaluation completed Falls prevention discussed;Education provided;Falls evaluation completed Falls prevention discussed;Education provided;Falls evaluation completed     Assessment & Plan Chronic infection and pain of right knee prosthesis Chronic infection with significant pain managed with amoxicillin  and oxycodone . Steroid taper provided temporary relief but not suitable long-term due to osteoporosis and infection risks. - Continue amoxicillin  for infection. - Continue oxycodone  for pain management. - Avoid long-term steroid use.  Lymphedema of right lower extremity Persistent lymphedema exacerbated by knee size and infection. Compression wraps have limited effectiveness. - Discuss alternative management options with vascular specialist, including potential use of pumps or Una boots.  Chronic embolism and thrombosis of right tibial vein Managed with Eliquis . The condition remains painful. - Continue Eliquis  for anticoagulation.  Type 2 diabetes mellitus with diabetic neuropathy Diabetes well-controlled with A1c at 5.7. Neuropathy managed  with pregabalin . Farxiga  used for kidney protection and diabetes management. - Continue pregabalin  50 mg at night. - Continue Farxiga  10 mg.  Morbid Obesity - BMI over 35 with co-morbidities such as DM/OSA Weight gain noted. Discussed importance of portion control and physical activity. - Encourage portion control and increased physical activity.  Obstructive sleep apnea Management status unclear due to pending CPAP approval. - Contact CPAP provider to resolve approval issues.  Paroxysmal atrial fibrillation Managed with amiodarone , Eliquis , and metoprolol . No recent syncope or palpitations. - Continue amiodarone , Eliquis , and metoprolol .  Pulmonary hypertension Managed by cardiologist.  Syncope No recent episodes. Managed with hydration and monitoring. - Ensure adequate hydration.  Peripheral vascular disease Managed with atorvastatin  and vascular specialist follow-up. - Continue atorvastatin . - Follow up with vascular specialist.  Hypothyroidism Managed with levothyroxine . Recent levels towards high  end of normal. - Continue levothyroxine  50 mcg. - Monitor thyroid  levels for potential adjustment.  Dyslipidemia Managed with atorvastatin . - Continue atorvastatin  40 mg.  Depression and anxiety Previously managed with medication.  General Health Maintenance Received flu shot. Discussed importance of deep breathing exercises to prevent pneumonia. - Encourage deep breathing exercises to prevent pneumonia.

## 2024-07-05 ENCOUNTER — Encounter: Payer: Self-pay | Admitting: Oncology

## 2024-07-05 ENCOUNTER — Ambulatory Visit: Payer: Self-pay | Admitting: Family Medicine

## 2024-07-05 ENCOUNTER — Inpatient Hospital Stay: Admitting: Oncology

## 2024-07-05 ENCOUNTER — Other Ambulatory Visit: Payer: Self-pay | Admitting: Physician Assistant

## 2024-07-05 VITALS — BP 103/55 | HR 61 | Temp 97.6°F | Resp 16 | Wt 267.0 lb

## 2024-07-05 DIAGNOSIS — K219 Gastro-esophageal reflux disease without esophagitis: Secondary | ICD-10-CM

## 2024-07-05 DIAGNOSIS — Z86718 Personal history of other venous thrombosis and embolism: Secondary | ICD-10-CM | POA: Diagnosis not present

## 2024-07-05 DIAGNOSIS — N1832 Chronic kidney disease, stage 3b: Secondary | ICD-10-CM | POA: Diagnosis not present

## 2024-07-05 DIAGNOSIS — N183 Chronic kidney disease, stage 3 unspecified: Secondary | ICD-10-CM | POA: Diagnosis not present

## 2024-07-05 DIAGNOSIS — D631 Anemia in chronic kidney disease: Secondary | ICD-10-CM

## 2024-07-05 DIAGNOSIS — N189 Chronic kidney disease, unspecified: Secondary | ICD-10-CM | POA: Diagnosis not present

## 2024-07-05 LAB — BRAIN NATRIURETIC PEPTIDE: Brain Natriuretic Peptide: 161 pg/mL — ABNORMAL HIGH (ref <100)

## 2024-07-05 MED ORDER — FERROUS SULFATE 325 (65 FE) MG PO TBEC: 1.0000 | DELAYED_RELEASE_TABLET | Freq: Every day | ORAL | Status: AC

## 2024-07-05 NOTE — Assessment & Plan Note (Signed)
 Encourage oral hydration and avoid nephrotoxins.  No M protein on SPEP and light chain ratio slightly elevated, non specific.

## 2024-07-05 NOTE — Assessment & Plan Note (Signed)
Patient has IVC filter. Continue chronic anticoagulation with Eliquis 5 mg twice daily.- she also has paroxysmal A-Fib

## 2024-07-05 NOTE — Assessment & Plan Note (Signed)
 Labs are reviewed and discussed with patient. Lab Results  Component Value Date   HGB 12.3 06/30/2024   TIBC 218 (L) 06/30/2024   IRONPCTSAT 22 06/30/2024   FERRITIN 185 06/30/2024   Hb and iron  panel are stable. No need for IV venofer  or erythropoitein Continue oral iron  supplementation ferrous sulfate  325 mg daily.

## 2024-07-05 NOTE — Progress Notes (Signed)
 Hematology/Oncology Progress note [phone] [phone]      REASON FOR VISIT  follow-up for DVT. Anemia due to CKD  ASSESSMENT & PLAN:   Anemia due to chronic kidney disease Labs are reviewed and discussed with patient. Lab Results  Component Value Date   HGB 12.3 06/30/2024   TIBC 218 (L) 06/30/2024   IRONPCTSAT 22 06/30/2024   FERRITIN 185 06/30/2024   Hb and iron  panel are stable. No need for IV venofer  or erythropoitein Continue oral iron  supplementation ferrous sulfate  325 mg daily.  Chronic kidney disease (CKD), stage III (moderate) (HCC) Encourage oral hydration and avoid nephrotoxins.  No M protein on SPEP and light chain ratio slightly elevated, non specific.   History of deep vein thrombosis Patient has IVC filter. Continue chronic anticoagulation with Eliquis  5 mg twice daily.- she also has paroxysmal A-Fib   Orders Placed This Encounter  Procedures   CBC with Differential (Cancer Center Only)    Standing Status:   Future    Expected Date:   01/03/2025    Expiration Date:   04/03/2025   Iron  and TIBC([facility])    Standing Status:   Future    Expected Date:   01/03/2025    Expiration Date:   04/03/2025   Ferritin    Standing Status:   Future    Expected Date:   01/03/2025    Expiration Date:   04/03/2025   Follow up in 6 months.  All questions were answered. The patient knows to call the clinic with any problems, questions or concerns.  Zelphia Cap, MD, PhD [facility] Hematology Oncology 07/05/2024    Pertinent hematology history 78 y.o. female with past medical history listed as below presents for follow-up of DVT. She was admitted from 10/27/2019-11/07/2019 for right lower extremity cellulitis/wound infection, bacteremia with group B streptococcus, right lower extremity DVT. # Patient had ankle fracture status post a recent right ankle ORIF on 09/02/2019 by Dr. Earlyne.  Patient has a wound VAC She was noticed to have right  lower extremity swelling, mild erythema and warmth with associated tenderness. 11/24/2019 US  venous right lower extremity showed a DVT within the right popliteal vein, with superficial thrombus seen at the saphenofemoral junction. Patient tells me that she was diagnosed with blood clots in August 2020.  At that time she had pain and swelling for about a week and went to ER for evaluation.    04/29/2019 ultrasound was negative for DVT, only showed a superficial thrombophlebitis involving a branch of the great saphenous vein in the proximal thigh.  She was fine to have right Baker's cyst.  Patient denies any immobilization factors contributing to this event.  Patient had elevated D-dimer at that time. Patient was started on Xarelto -starter kit by emergency room.  Patient was recommended to have US  aorta iliac Doppler by primary care provider Dr. Glenard and I do not see the study was done.  Xarelto  was continued outpatient for thrombophlebitis..  Patient was hospitalized again at the end of November 2020 due to syncope episodes which likely due to orthostatic hypotension.  Patient was noted to have anemia, CT abdomen pelvis to evaluate after trauma, showed left gluteal hematoma without signs of extravasation.  Xarelto  was held during the hospitalization.  Saw cardiology Dr.Callwood.  Patient was seen by Dr. Glenard on 08/23/2019 and the Xarelto  was stopped.   09/02/2019 patient slipped and obtained right ankle fracture.  Status post ORIF procedure by Dr. Lennie.  Postop patient was recommended to  start on Lovenox  40 mg daily injections prophylactically until her presentation to emergency room in February.  At that point, she was found to have a provoked right lower extremity DVT and she was seen by me during that admission.  Patient was recommended to start therapeutic Lovenox .  During the same hospitalization, patient developed spontaneous iliopsoas hematoma and therapeutic Lovenox  was discontinued. Patient had  IVC filter placed on 11/02/2019.  Patient was recommended to have outpatient follow-up with me. Patient was recently seen by Dr. Glenard and was found to have worsening of right lower extremity swelling, Doppler ultrasound was obtained on 02/03/2020 which was positive for DVT in the right lower extremity, increased clot burden in the right lower extremity since 10/27/2019.  Thrombus extending from the right common femoral vein to the right calf.  Patient was referred back to me for evaluation and management.  Currently off anticoagulation due to recurrent hematoma.  IVC filter was placed.  No plan for filter retrieval due to risk of recurrent lower extremity thrombosis. Negative antiphospholipid syndrome antibodies, prothrombin gene mutation, factor V Leiden mutation.  INTERVAL HISTORY CECYLIA BRAZILL is a 78 y.o. female who has above history reviewed by me today presents for follow up visit for anemia, DVT  Patient has chronic lower extremity DVT, on chronic anticoagulation.  Patient also has IVC filter. Eliquis  5 mg twice daily. Denies bleeding events . She takes oral iron  supplementation.  Right knee pain   Review of Systems  Constitutional:  Positive for fatigue. Negative for appetite change, chills and fever.  HENT:   Negative for hearing loss and voice change.   Eyes:  Negative for eye problems.  Respiratory:  Negative for chest tightness and cough.   Cardiovascular:  Positive for leg swelling. Negative for chest pain.       Hip/thigh swelling  Gastrointestinal:  Negative for abdominal distention, abdominal pain and blood in stool.  Endocrine: Negative for hot flashes.  Genitourinary:  Negative for difficulty urinating, frequency and nocturia.   Musculoskeletal:  Positive for arthralgias.  Skin:  Negative for itching and rash.  Neurological:  Negative for extremity weakness.  Hematological:  Negative for adenopathy.  Psychiatric/Behavioral:  Negative for confusion.       Allergies   Allergen Reactions   Lisinopril Swelling     Past Medical History:  Diagnosis Date   Cervical spondylosis    Chronic kidney disease    Stage 3   DDD (degenerative disc disease), cervical    [facility]   Diabetes mellitus without complication (HCC)    Diverticulosis    Hyperlipidemia    Hypertension    Intertrigo    Leg cramps    Leg varices    Obesity    OSA (obstructive sleep apnea)    Symptomatic menopausal or female climacteric states    Syncope      Past Surgical History:  Procedure Laterality Date   ABDOMINAL HYSTERECTOMY  1993   Total   BUNIONECTOMY Bilateral 1993   ESOPHAGOGASTRODUODENOSCOPY (EGD) WITH PROPOFOL  N/A 05/13/2023   Procedure: ESOPHAGOGASTRODUODENOSCOPY (EGD) WITH PROPOFOL ;  Surgeon: Therisa Bi, MD;  Location: [facility] ENDOSCOPY;  Service: Gastroenterology;  Laterality: N/A;   I & D KNEE WITH POLY EXCHANGE Right 11/19/2017   Procedure: RIGHT KNEE POLY EXCHANGE WITH IRRIGATION AND DEBRIDEMENT;  Surgeon: Kathlynn Sharper, MD;  Location: [facility] ORS;  Service: Orthopedics;  Laterality: Right;   IR FLUORO GUIDED NEEDLE PLC ASPIRATION/INJECTION LOC  09/19/2021   JOINT REPLACEMENT     bilateral knee  LAMINECTOMY  11/14/2013    Cervical Fusion , [facility], Dr. Oma   LASER ABLATION Bilateral 07/29/2012   Dr. Marea   LOWER EXTREMITY ANGIOGRAPHY Right 11/02/2019   Procedure: Lower Extremity Angiography;  Surgeon: Jama Cordella MATSU, MD;  Location: [facility] INVASIVE CV LAB;  Service: Cardiovascular;  Laterality: Right;   ORIF ANKLE FRACTURE Right 09/02/2019   Procedure: OPEN REDUCTION INTERNAL FIXATION (ORIF) ANKLE FRACTURE BIMALLEOLAR;  Surgeon: Lennie Barter, DPM;  Location: [facility] ORS;  Service: Podiatry;  Laterality: Right;   PERIPHERAL VASCULAR THROMBECTOMY Right 03/02/2020   Procedure: PERIPHERAL VASCULAR THROMBECTOMY;  Surgeon: Marea Selinda RAMAN, MD;  Location: [facility] INVASIVE CV LAB;  Service: Cardiovascular;  Laterality: Right;   REPLACEMENT TOTAL KNEE Left 03/2010   Dr. Kathlynn    TEE WITHOUT CARDIOVERSION N/A 09/19/2021   Procedure: TRANSESOPHAGEAL ECHOCARDIOGRAM (TEE);  Surgeon: Hester Wolm PARAS, MD;  Location: [facility] ORS;  Service: Cardiovascular;  Laterality: N/A;   TOTAL HIP ARTHROPLASTY Left 12/11/2015   Procedure: TOTAL HIP ARTHROPLASTY ANTERIOR APPROACH;  Surgeon: Ozell Kathlynn, MD;  Location: [facility] ORS;  Service: Orthopedics;  Laterality: Left;   TOTAL KNEE ARTHROPLASTY Right 03/11/2016   Procedure: TOTAL KNEE ARTHROPLASTY;  Surgeon: Ozell Kathlynn, MD;  Location: [facility] ORS;  Service: Orthopedics;  Laterality: Right;    Social History   Socioeconomic History   Marital status: Married    Spouse name: Tiffannie Sloss   Number of children: 1   Years of education: Not on file   Highest education level: Not on file  Occupational History   Occupation: Retired  Tobacco Use   Smoking status: Never    Passive exposure: Never   Smokeless tobacco: Never  Vaping Use   Vaping status: Never Used  Substance and Sexual Activity   Alcohol use: No    Alcohol/week: 0.0 standard drinks of alcohol   Drug use: No   Sexual activity: Yes    Partners: Male  Other Topics Concern   Not on file  Social History Narrative   Married, one adopted grown child    Social Drivers of Corporate investment banker Strain: Low Risk  (06/06/2024)   Received from [facility]   Overall Financial Resource Strain (CARDIA)    Difficulty of Paying Living Expenses: Not very hard  Food Insecurity: No Food Insecurity (06/06/2024)   Received from [facility]   Hunger Vital Sign    Within the past 12 months, you worried that your food would run out before you got the money to buy more.: Never true    Within the past 12 months, the food you bought just didn't last and you didn't have money to get more.: Never true  Transportation Needs: No Transportation Needs (06/06/2024)   Received from [facility] - Transportation    In the past 12  months, has lack of transportation kept you from medical appointments or from getting medications?: No    Lack of Transportation (Non-Medical): No  Physical Activity: Insufficiently Active (08/06/2023)   Exercise Vital Sign    Days of Exercise per Week: 5 days    Minutes of Exercise per Session: 20 min  Stress: No Stress Concern Present (08/06/2023)   [employer] of Occupational Health - Occupational Stress Questionnaire    Feeling of Stress : Not at all  Social Connections: Moderately Integrated (08/06/2023)   Social Connection and Isolation Panel    Frequency of Communication with Friends and Family: More than three times a week  Frequency of Social Gatherings with Friends and Family: Once a week    Attends Religious Services: More than 4 times per year    Active Member of [employer] or Organizations: No    Attends Banker Meetings: Never    Marital Status: Married  Catering manager Violence: Not At Risk (08/17/2023)   Humiliation, Afraid, Rape, and Kick questionnaire    Fear of Current or Ex-Partner: No    Emotionally Abused: No    Physically Abused: No    Sexually Abused: No    Family History  Problem Relation Age of Onset   Diabetes Mother    Hyperlipidemia Mother    Hypertension Mother    Diabetes Father    Hyperlipidemia Father    Hypertension Father    Obesity Father    Hypertension Sister    Hyperlipidemia Sister    Hyperlipidemia Sister    Breast cancer Neg Hx      Current Outpatient Medications:    ACCU-CHEK GUIDE test strip, TEST ONCE A DAY AS DIRECTED, Disp: 50 strip, Rfl: 1   acetaminophen  (TYLENOL ) 650 MG CR tablet, Take 650-1,300 mg by mouth 2 (two) times daily as needed for pain., Disp: , Rfl:    amiodarone  (PACERONE ) 200 MG tablet, Take 200 mg by mouth daily., Disp: , Rfl:    amoxicillin  (AMOXIL ) 500 MG capsule, Take 1 capsule (500 mg total) by mouth 2 (two) times daily., Disp: 60 capsule, Rfl: 12   apixaban  (ELIQUIS ) 5 MG TABS tablet,  Take 1 tablet by mouth 2 (two) times daily., Disp: , Rfl:    atorvastatin  (LIPITOR) 40 MG tablet, TAKE 1 TABLET BY MOUTH EVERY DAY IN THE EVENING, Disp: 90 tablet, Rfl: 1   blood glucose meter kit and supplies, 1 each by Other route daily at 12 noon. Dispense based on patient and insurance preference. Check fsbs once a day, Disp: 1 each, Rfl: 0   Calcium  Carbonate-Vitamin D  600-400 MG-UNIT tablet, Take 1 tablet by mouth 2 (two) times daily., Disp: 60 tablet, Rfl: 0   dapagliflozin  propanediol (FARXIGA ) 10 MG TABS tablet, Take 1 tablet (10 mg total) by mouth daily before breakfast. Patient receives via [place] Patient Assistance, Disp: 90 tablet, Rfl: 3   escitalopram  (LEXAPRO ) 5 MG tablet, TAKE 1 TABLET (5 MG TOTAL) BY MOUTH DAILY., Disp: 90 tablet, Rfl: 1   fluticasone  (FLONASE ) 50 MCG/ACT nasal spray, SPRAY 2 SPRAYS INTO EACH NOSTRIL EVERY DAY, Disp: 48 mL, Rfl: 0   hydroquinone 4 % cream, Apply topically at bedtime as needed., Disp: , Rfl:    ketoconazole  (NIZORAL ) 2 % cream, Apply 1 Application topically as needed for irritation., Disp: , Rfl:    latanoprost  (XALATAN ) 0.005 % ophthalmic solution, Place 1 drop into both eyes at bedtime., Disp: , Rfl:    levothyroxine  (SYNTHROID ) 50 MCG tablet, Take 1 tablet (50 mcg total) by mouth daily. Take two three times a week, Disp: 100 tablet, Rfl: 0   Magnesium  Oxide 500 MG CAPS, Take 500 mg by mouth daily. , Disp: , Rfl:    metoprolol  succinate (TOPROL -XL) 50 MG 24 hr tablet, Take 50 mg by mouth daily., Disp: , Rfl:    omeprazole  (PRILOSEC) 40 MG capsule, TAKE 1 CAPSULE (40 MG TOTAL) BY MOUTH DAILY., Disp: 30 capsule, Rfl: 0   oxybutynin  (DITROPAN -XL) 10 MG 24 hr tablet, Take 1 tablet (10 mg total) by mouth daily., Disp: 90 tablet, Rfl: 3   oxyCODONE -acetaminophen  (PERCOCET/ROXICET) 5-325 MG tablet, Take 1 tablet by mouth every  4 (four) hours as needed., Disp: , Rfl:    potassium chloride  (KLOR-CON ) 10 MEQ tablet, Take 10 mEq by mouth daily., Disp: , Rfl:     pregabalin  (LYRICA ) 50 MG capsule, Take 1 capsule (50 mg total) by mouth every evening., Disp: 90 capsule, Rfl: 0   SIMBRINZA 1-0.2 % SUSP, Apply 1 drop to eye 2 (two) times daily., Disp: , Rfl:    sodium chloride  (OCEAN) 0.65 % SOLN nasal spray, Place 1 spray into both nostrils 3 (three) times daily., Disp: 30 mL, Rfl: 0   torsemide  (DEMADEX ) 20 MG tablet, TAKE 1 TABLET BY MOUTH EVERY DAY, Disp: 90 tablet, Rfl: 1   ferrous sulfate  325 (65 FE) MG EC tablet, Take 1 tablet (325 mg total) by mouth daily., Disp: , Rfl:    polyethylene glycol (MIRALAX  / GLYCOLAX ) 17 g packet, Take 17 g by mouth daily. (Patient taking differently: Take 17 g by mouth every other day.), Disp: 14 each, Rfl: 0   traZODone  (DESYREL ) 50 MG tablet, TAKE 1/2 TO 1 TABLET (25-50 MG TOTAL) BY MOUTH AT BEDTIME AS NEEDED. FOR SLEEP (Patient not taking: Reported on 07/05/2024), Disp: 90 tablet, Rfl: 0  Physical exam:  Vitals:   07/05/24 1007 07/05/24 1013  BP: (!) 88/57 (!) 103/55  Pulse: 61   Resp: 16   Temp: 97.6 F (36.4 C)   TempSrc: Tympanic   SpO2: 97%   Weight: 267 lb (121.1 kg)    Physical Exam Constitutional:      General: She is not in acute distress.    Appearance: She is obese.     Comments: Patient sits in the wheelchair  HENT:     Head: Normocephalic and atraumatic.  Eyes:     General: No scleral icterus. Cardiovascular:     Rate and Rhythm: Normal rate and regular rhythm.     Heart sounds: Normal heart sounds.  Pulmonary:     Effort: Pulmonary effort is normal. No respiratory distress.     Breath sounds: No wheezing.  Abdominal:     General: Bowel sounds are normal. There is no distension.     Palpations: Abdomen is soft.  Musculoskeletal:        General: Swelling present. No deformity. Normal range of motion.     Cervical back: Normal range of motion and neck supple.     Comments: Bilateral lower extremity swelling  Skin:    General: Skin is warm and dry.     Findings: No erythema or rash.   Neurological:     Mental Status: She is alert and oriented to person, place, and time. Mental status is at baseline.     Cranial Nerves: No cranial nerve deficit.     Coordination: Coordination normal.  Psychiatric:        Mood and Affect: Mood normal.        Latest Ref Rng & Units 07/04/2024   11:28 AM  CMP  Glucose 65 - 99 mg/dL 84   BUN 7 - 25 mg/dL 16   Creatinine 9.39 - 1.00 mg/dL 8.69   Sodium 864 - 853 mmol/L 141   Potassium 3.5 - 5.3 mmol/L 4.7   Chloride 98 - 110 mmol/L 105   CO2 20 - 32 mmol/L 28   Calcium  8.6 - 10.4 mg/dL 9.0   Total Protein 6.1 - 8.1 g/dL 7.5   Total Bilirubin 0.2 - 1.2 mg/dL 0.5   AST 10 - 35 U/L 26   ALT 6 - 29 U/L  15       Latest Ref Rng & Units 06/30/2024   10:22 AM  CBC  WBC 4.0 - 10.5 K/uL 6.0   Hemoglobin 12.0 - 15.0 g/dL 87.6   Hematocrit 63.9 - 46.0 % 38.4   Platelets 150 - 400 K/uL 184     RADIOGRAPHIC STUDIES: I have personally reviewed the radiological images as listed and agreed with the findings in the report. No results found.

## 2024-07-07 ENCOUNTER — Other Ambulatory Visit: Payer: Self-pay | Admitting: Physician Assistant

## 2024-07-07 DIAGNOSIS — K219 Gastro-esophageal reflux disease without esophagitis: Secondary | ICD-10-CM

## 2024-07-07 NOTE — Progress Notes (Deleted)
 07/08/2024 9:57 PM   Becky Trevino 11/15/1945 [id number]  Referring provider: Sowles, Krichna, MD [address] [place],  [place] [address]  Urological history: 1. Mixed incontinence -contributing factors of age, obesity, sleep apnea (non-compliant), anxiety, depression, cystocele, diuretics, DM and DDD -Myrbetriq  50 mg daily, cost prohibitive -oxybutynin  XL 10 mg daily   2. Right renal cyst -RUS (07/2022) 1.6 cm benign simple cyst in the superior pole   No chief complaint on file.   HPI: Becky Trevino is a 78 y.o. female who presents today for 12 month follow up.  Previous records reviewed.   They are having (1 to 7) or (8 or more) daytime voids,  they are having nocturia (1-2) or (3 or more) and urgency is (none, mild, strong, severe).   They are having (stress, urge or mixed incontinence.)    they are having urinary leakage (1-2 times weekly, 3 or more times weekly, 1-2 times daily and 3 or more times daily) They are using absorbent products for leakage (no, sometimes, always )   the type of products they use are (panty liners, absorbant pads, depends) *** daily.  They are not limiting fluids.  They are not engaging in toilet mapping  ***  UA ***  PVR ***  Serum creatinine 1.30, eGFR 42  Hemoglobin A1c 5.7  OAB agent: oxybutynin  XL 10 mg daily   Diuretics: Toresmide 20 mg    Fluid consumption ***  PMH: Past Medical History:  Diagnosis Date   Cervical spondylosis    Chronic kidney disease    Stage 3   DDD (degenerative disc disease), cervical    [facility]   Diabetes mellitus without complication (HCC)    Diverticulosis    Hyperlipidemia    Hypertension    Intertrigo    Leg cramps    Leg varices    Obesity    OSA (obstructive sleep apnea)    Symptomatic menopausal or female climacteric states    Syncope     Surgical History: Past Surgical History:  Procedure Laterality Date   ABDOMINAL HYSTERECTOMY  1993   Total    BUNIONECTOMY Bilateral 1993   ESOPHAGOGASTRODUODENOSCOPY (EGD) WITH PROPOFOL  N/A 05/13/2023   Procedure: ESOPHAGOGASTRODUODENOSCOPY (EGD) WITH PROPOFOL ;  Surgeon: Therisa Bi, MD;  [facility] END[facility];  Service: Gastroenterology;  Laterality: N/A;   I & D KNEE WITH POLY EXCHANGE Right 11/19/2017   Procedure: RIGHT KNEE POLY EXCHANGE WITH IRRIGATION AND DEBRIDEMENT;  Surgeon: Kathlynn Sharper, MD;  Location: [facility] ORS;  Service: Orthopedics;  Laterality: Right;   IR FLUORO GUIDED NEEDLE PLC ASPIRATION/INJECTION LOC  09/19/2021   JOINT REPLACEMENT     bilateral knee   LAMINECTOMY  11/14/2013    Cervical Fusion , [facility], Dr. Oma   LASER ABLATION Bilateral 07/29/2012   Dr. Marea   LOWER EXTREMITY ANGIOGRAPHY Right 11/02/2019   Procedure: Lower Extremity Angiography;  Surgeon: Jama Cordella MATSU, MD;  Location: [facility] INVASIVE CV LAB;  Service: Cardiovascular;  Laterality: Right;   ORIF ANKLE FRACTURE Right 09/02/2019   Procedure: OPEN REDUCTION INTERNAL FIXATION (ORIF) ANKLE FRACTURE BIMALLEOLAR;  Surgeon: Lennie Barter, DPM;  Location: [facility] ORS;  Service: Podiatry;  Laterality: Right;   PERIPHERAL VASCULAR THROMBECTOMY Right 03/02/2020   Procedure: PERIPHERAL VASCULAR THROMBECTOMY;  Surgeon: Marea Selinda RAMAN, MD;  Location: [facility];  Service: Cardiovascular;  Laterality: Right;   REPLACEMENT TOTAL KNEE Left 03/2010   Dr. Kathlynn   TEE WITHOUT CARDIOVERSION N/A 09/19/2021   Procedure: TRANSESOPHAGEAL  ECHOCARDIOGRAM (TEE);  Surgeon: Hester Wolm PARAS, MD;  Location: [facility];  Service: Cardiovascular;  Laterality: N/A;   TOTAL HIP ARTHROPLASTY Left 12/11/2015   Procedure: TOTAL HIP ARTHROPLASTY ANTERIOR APPROACH;  Surgeon: Ozell Flake, MD;  Location: [facility];  Service: Orthopedics;  Laterality: Left;   TOTAL KNEE ARTHROPLASTY Right 03/11/2016   Procedure: TOTAL KNEE ARTHROPLASTY;  Surgeon: Ozell Flake, MD;  Location: [facility];  Service: Orthopedics;  Laterality: Right;    Home Medications:   Allergies as of 07/08/2024       Reactions   Lisinopril Swelling        Medication List        Accurate as of July 07, 2024  9:57 PM. If you have any questions, ask your nurse or doctor.          Accu-Chek Guide test strip Generic drug: glucose blood TEST ONCE A DAY AS DIRECTED   acetaminophen  650 MG CR tablet Commonly known as: TYLENOL  Take 650-1,300 mg by mouth 2 (two) times daily as needed for pain.   amiodarone  200 MG tablet Commonly known as: PACERONE  Take 200 mg by mouth daily.   amoxicillin  500 MG capsule Commonly known as: AMOXIL  Take 1 capsule (500 mg total) by mouth 2 (two) times daily.   apixaban  5 MG Tabs tablet Commonly known as: ELIQUIS  Take 1 tablet by mouth 2 (two) times daily.   atorvastatin  40 MG tablet Commonly known as: LIPITOR TAKE 1 TABLET BY MOUTH EVERY DAY IN THE EVENING   blood glucose meter kit and supplies 1 each by Other route daily at 12 noon. Dispense based on patient and insurance preference. Check fsbs once a day   Calcium  Carbonate-Vitamin D  600-400 MG-UNIT tablet Take 1 tablet by mouth 2 (two) times daily.   dapagliflozin  propanediol 10 MG Tabs tablet Commonly known as: Farxiga  Take 1 tablet (10 mg total) by mouth daily before breakfast. Patient receives via [employer] Patient Assistance   escitalopram  5 MG tablet Commonly known as: LEXAPRO  TAKE 1 TABLET (5 MG TOTAL) BY MOUTH DAILY.   ferrous sulfate  325 (65 FE) MG EC tablet Take 1 tablet (325 mg total) by mouth daily.   fluticasone  50 MCG/ACT nasal spray Commonly known as: FLONASE  SPRAY 2 SPRAYS INTO EACH NOSTRIL EVERY DAY   hydroquinone 4 % cream Apply topically at bedtime as needed.   ketoconazole  2 % cream Commonly known as: NIZORAL  Apply 1 Application topically as needed for irritation.   latanoprost  0.005 % ophthalmic solution Commonly known as: XALATAN  Place 1 drop into both eyes at bedtime.   levothyroxine  50 MCG tablet Commonly known as:  SYNTHROID  Take 1 tablet (50 mcg total) by mouth daily. Take two three times a week   Magnesium  Oxide -Mg Supplement 500 MG Caps Take 500 mg by mouth daily.   metoprolol  succinate 50 MG 24 hr tablet Commonly known as: TOPROL -XL Take 50 mg by mouth daily.   omeprazole  40 MG capsule Commonly known as: PRILOSEC TAKE 1 CAPSULE (40 MG TOTAL) BY MOUTH DAILY.   oxybutynin  10 MG 24 hr tablet Commonly known as: DITROPAN -XL Take 1 tablet (10 mg total) by mouth daily.   oxyCODONE -acetaminophen  5-325 MG tablet Commonly known as: PERCOCET/ROXICET Take 1 tablet by mouth every 4 (four) hours as needed.   polyethylene glycol 17 g packet Commonly known as: MIRALAX  / GLYCOLAX  Take 17 g by mouth daily. What changed: when to take this   potassium chloride  10 MEQ tablet Commonly known as: KLOR-CON  Take 10 mEq by  mouth daily.   pregabalin  50 MG capsule Commonly known as: LYRICA  Take 1 capsule (50 mg total) by mouth every evening.   Simbrinza 1-0.2 % Susp Generic drug: Brinzolamide -Brimonidine  Apply 1 drop to eye 2 (two) times daily.   sodium chloride  0.65 % Soln nasal spray Commonly known as: OCEAN Place 1 spray into both nostrils 3 (three) times daily.   torsemide  20 MG tablet Commonly known as: DEMADEX  TAKE 1 TABLET BY MOUTH EVERY DAY   traZODone  50 MG tablet Commonly known as: DESYREL  TAKE 1/2 TO 1 TABLET (25-50 MG TOTAL) BY MOUTH AT BEDTIME AS NEEDED. FOR SLEEP        Allergies:  Allergies  Allergen Reactions   Lisinopril Swelling    Family History: Family History  Problem Relation Age of Onset   Diabetes Mother    Hyperlipidemia Mother    Hypertension Mother    Diabetes Father    Hyperlipidemia Father    Hypertension Father    Obesity Father    Hypertension Sister    Hyperlipidemia Sister    Hyperlipidemia Sister    Breast cancer Neg Hx     Social History:  reports that she has never smoked. She has never been exposed to tobacco smoke. She has never used  smokeless tobacco. She reports that she does not drink alcohol and does not use drugs.  ROS: For pertinent review of systems please refer to history of present illness  Physical Exam: There were no vitals taken for this visit.  Constitutional:  Well nourished. Alert and oriented, No acute distress. HEENT: [place] AT, moist mucus membranes.  Trachea midline, no masses. Cardiovascular: No clubbing, cyanosis, or edema. Respiratory: Normal respiratory effort, no increased work of breathing. GU: No CVA tenderness.  No bladder fullness or masses.  Recession of labia minora, dry, pale vulvar vaginal mucosa and loss of mucosal ridges and folds.  Normal urethral meatus, no lesions, no prolapse, no discharge.   No urethral masses, tenderness and/or tenderness. No bladder fullness, tenderness or masses. *** vagina mucosa, *** estrogen effect, no discharge, no lesions, *** pelvic support, *** cystocele and *** rectocele noted.  No cervical motion tenderness.  Uterus is freely mobile and non-fixed.  No adnexal/parametria masses or tenderness noted.  Anus and perineum are without rashes or lesions.   ***  Neurologic: Grossly intact, no focal deficits, moving all 4 extremities. Psychiatric: Normal mood and affect.    Laboratory Data: See Epic and HPI   I have reviewed the labs.  Pertinent Imaging  N/A  Assessment & Plan:    1. Mixed incontinence -At goal on oxybutynin  XL 10 mg daily -Continue the medication refill sent to pharmacy  2. Nocturia -at goal with oxybutynin  XL 10 mg daily  No follow-ups on file.  These notes generated with voice recognition software. I apologize for typographical errors.  CLOTILDA HELON RIGGERS  [facility] [address] [place], [place] [address] [phone]

## 2024-07-08 ENCOUNTER — Ambulatory Visit: Payer: Self-pay | Admitting: Urology

## 2024-07-08 DIAGNOSIS — N3946 Mixed incontinence: Secondary | ICD-10-CM

## 2024-07-08 DIAGNOSIS — R351 Nocturia: Secondary | ICD-10-CM

## 2024-07-11 ENCOUNTER — Other Ambulatory Visit: Payer: Self-pay | Admitting: Family Medicine

## 2024-07-11 DIAGNOSIS — E039 Hypothyroidism, unspecified: Secondary | ICD-10-CM

## 2024-07-12 ENCOUNTER — Other Ambulatory Visit: Payer: Self-pay | Admitting: Family Medicine

## 2024-07-12 DIAGNOSIS — E039 Hypothyroidism, unspecified: Secondary | ICD-10-CM

## 2024-07-12 NOTE — Telephone Encounter (Unsigned)
 Copied from CRM [id number]. Topic: Clinical - Medication Refill >> Jul 12, 2024 11:40 AM [name] wrote: Medication: levothyroxine  (SYNTHROID ) 50 MCG tablet   Has the patient contacted their pharmacy? Yes (Agent: If no, request that the patient contact the pharmacy for the refill. If patient does not wish to contact the pharmacy document the reason why and proceed with request.) (Agent: If yes, when and what did the pharmacy advise?)  This is the patient's preferred pharmacy:  [facility] [address] [place] - [address] [place] [address] Phone: [phone] Fax: [phone]   Is this the correct pharmacy for this prescription? Yes If no, delete pharmacy and type the correct one.   Has the prescription been filled recently? No  Is the patient out of the medication? Yes  Has the patient been seen for an appointment in the last year OR does the patient have an upcoming appointment? Yes  Can we respond through MyChart? No  Agent: Please be advised that Rx refills may take up to 3 business days. We ask that you follow-up with your pharmacy.

## 2024-07-14 ENCOUNTER — Other Ambulatory Visit: Payer: Self-pay | Admitting: Family Medicine

## 2024-07-14 DIAGNOSIS — E039 Hypothyroidism, unspecified: Secondary | ICD-10-CM

## 2024-07-14 MED ORDER — LEVOTHYROXINE SODIUM 75 MCG PO TABS: 75.0000 ug | ORAL_TABLET | Freq: Every day | ORAL | 0 refills | Status: DC

## 2024-07-14 NOTE — Telephone Encounter (Unsigned)
 Copied from CRM [id number]. Topic: Clinical - Medication Refill >> Jul 14, 2024  1:10 PM Charlet HERO wrote: Medication: levothyroxine  (SYNTHROID ) 75 MCG tablet  Has the patient contacted their pharmacy? Yes (Yes they are not able to contact provider  This is the patient's preferred pharmacy:  [facility] [address] [place] - [address] [place] [address] Phone: [phone] Fax: [phone]    Is this the correct pharmacy for this prescription? Yes If no, delete pharmacy and type the correct one.   Has the prescription been filled recently? Yes  Is the patient out of the medication? Yes  Has the patient been seen for an appointment in the last year OR does the patient have an upcoming appointment? Yes  Can we respond through MyChart? No  Agent: Please be advised that Rx refills may take up to 3 business days. We ask that you follow-up with your pharmacy.

## 2024-07-14 NOTE — Telephone Encounter (Signed)
 Requested medications are due for refill today.  yes  Requested medications are on the active medications list.  yes  Last refill. 05/05/2024 #[phone] 0 rf  Future visit scheduled.   yes  Notes to clinic.  Sig on request differs from sig on med list. Please review.    Requested Prescriptions  Pending Prescriptions Disp Refills   levothyroxine  (SYNTHROID ) 50 MCG tablet 100 tablet 0    Sig: Take 1 tablet (50 mcg total) by mouth daily. Take two three times a week     Endocrinology:  Hypothyroid Agents Passed - 07/14/2024 11:07 AM      Passed - TSH in normal range and within 360 days    TSH  Date Value Ref Range Status  07/04/2024 4.27 0.40 - 4.50 mIU/L Final         Passed - Valid encounter within last 12 months    Recent Outpatient Visits           1 week ago Type 2 diabetes mellitus with diabetic neuropathy, without long-term current use of insulin  [facility])   [place] [facility] Glen[facility] Mire, MD   2 months ago Hypothyroidism (acquired)   [place] [facility] Glenard Mire, MD   4 months ago Type 2 diabetes mellitus with diabetic neuropathy, without long-term current use of insulin  [facility])   [place] [facility] [place], Mire, MD   8 months ago AF (paroxysmal atrial fibrillation) [facility])   [place] [facility] Sowles, Krichna, MD       Future Appointments             In 2 weeks [name], Clotilda DELENA RIGGERS [facility] [place]   In 11 months [place] Bodily, MD [facility]

## 2024-07-15 NOTE — Telephone Encounter (Signed)
 Requested Prescriptions  Refused Prescriptions Disp Refills   levothyroxine  (SYNTHROID ) 75 MCG tablet 90 tablet 0    Sig: Take 1 tablet (75 mcg total) by mouth daily.     Endocrinology:  Hypothyroid Agents Passed - 07/15/2024  4:57 PM      Passed - TSH in normal range and within 360 days    TSH  Date Value Ref Range Status  07/04/2024 4.27 0.40 - 4.50 mIU/L Final         Passed - Valid encounter within last 12 months    Recent Outpatient Visits           1 week ago Type 2 diabetes mellitus with diabetic neuropathy, without long-term current use of insulin  [facility])   [place] [facility] Glenard Mire, MD   2 months ago Hypothyroidism (acquired)   [place] [facility] Glenard Mire, MD   4 months ago Type 2 diabetes mellitus with diabetic neuropathy, without long-term current use of insulin  [facility])   [place] [facility] [place], Mire, MD   8 months ago AF (paroxysmal atrial fibrillation) [facility])   [place] [facility] Sowles, Krichna, MD       Future Appointments             In 1 week [name], Clotilda DELENA RIGGERS [facility] [place]   In 11 months Fayette Bodily, MD [facility]

## 2024-07-18 ENCOUNTER — Ambulatory Visit

## 2024-07-18 ENCOUNTER — Ambulatory Visit: Payer: Self-pay

## 2024-07-18 DIAGNOSIS — Z23 Encounter for immunization: Secondary | ICD-10-CM | POA: Diagnosis not present

## 2024-07-18 DIAGNOSIS — Z719 Counseling, unspecified: Secondary | ICD-10-CM

## 2024-07-18 NOTE — Telephone Encounter (Signed)
 FYI Only or Action Required?: Action required by provider: update on patient condition. Please advise on medication for pain or if Video visit can be done to assess  Patient was last seen in primary care on 07/04/2024 by Sowles, Krichna, MD.  Called Nurse Triage reporting Leg Swelling.  Symptoms began several months ago.  Interventions attempted: Prescription medications: Oxy, Lyrica  and Rest, hydration, or home remedies.  Symptoms are: gradually worsening.  Triage Disposition: No disposition on file.  Patient/caregiver understands and will follow disposition?:   Copied from CRM [id number]. Topic: Clinical - Red Word Triage >> Jul 18, 2024  4:21 PM Becky Trevino wrote: Red Word that prompted transfer to Nurse Triage: Pain & swelling in left leg, requesting medication from PCP. States ortho doctor told her to reach out to PCP    [id number] [place] [place] - [address] Reason for Disposition  [1] MILD swelling of both ankles (i.e., pedal edema) AND [2] new-onset or getting worse  Answer Assessment - Initial Assessment Questions Left worse than Right leg swelling and pain. Ortho suggested reaching out to PCP for pain medication. Taking oxycodone  and Lyrica  with no improvement. Patient states this pain has been ongoing since her surgery. Ortho has run out of options. Had an injection at one point that helped some but no longer benefiting. Denies hot hard knot, denies fever, dizziness, CP or SOB. Patient states she has no ride to the office. She has done Video visits in the past. Patient advised ED/UC precautions and understood. Please reach out to patient with recommendations or if video visit is appropriate.   1. ONSET: When did the swelling start? (e.g., minutes, hours, days)     A few months  2. LOCATION: What part of the leg is swollen?  Are both legs swollen or just one leg?     L >R 3. SEVERITY: How bad is the swelling? (e.g., localized; mild, moderate, severe)      Bigger than normal  4. REDNESS: Is there redness or signs of infection?     denies 5. PAIN: Is the swelling painful to touch? If Yes, ask: How painful is it?   (Scale 1-10; mild, moderate or severe)     8/10 6. FEVER: Do you have a fever? If Yes, ask: What is it, how was it measured, and when did it start?      denies 7. CAUSE: What do you think is causing the leg swelling?     unsure 8. MEDICAL HISTORY: Do you have a history of blood clots (e.g., DVT), cancer, heart failure, kidney disease, or liver failure?    Filter was placed during ortho surgery she states- IVC filter 9. RECURRENT SYMPTOM: Have you had leg swelling before? If Yes, ask: When was the last time? What happened that time?     Since hospital visit 10. OTHER SYMPTOMS: Do you have any other symptoms? (e.g., chest pain, difficulty breathing)       Denies  Protocols used: Leg Swelling and Edema-A-AH

## 2024-07-18 NOTE — Progress Notes (Signed)
 Homevisit to homebound pt requesting covid vaccine.  Vaccine screening form completed and signed by pt (sent for scanning).   VIS given.  Spikevax  12+ 2025-26 formula administered IM right deltoid; pt tolerated well.  Reviewed post vaccination instructions and answered any questions.  [facility] updated and copy mailed to pt.    Shona KATHEE Diesel, RN

## 2024-07-19 NOTE — Telephone Encounter (Signed)
 Lvm asking pt to return call to schedule appointment. If Dr Glenard is booked by the time pt call back please schedule with the next available provider

## 2024-07-20 ENCOUNTER — Ambulatory Visit: Admitting: Urology

## 2024-07-21 ENCOUNTER — Ambulatory Visit (INDEPENDENT_AMBULATORY_CARE_PROVIDER_SITE_OTHER): Admitting: Family Medicine

## 2024-07-21 ENCOUNTER — Telehealth (INDEPENDENT_AMBULATORY_CARE_PROVIDER_SITE_OTHER): Payer: Self-pay | Admitting: Vascular Surgery

## 2024-07-21 ENCOUNTER — Encounter: Payer: Self-pay | Admitting: Family Medicine

## 2024-07-21 VITALS — BP 126/78 | HR 60 | Resp 16 | Ht 71.0 in | Wt 267.0 lb

## 2024-07-21 DIAGNOSIS — R0989 Other specified symptoms and signs involving the circulatory and respiratory systems: Secondary | ICD-10-CM | POA: Diagnosis not present

## 2024-07-21 DIAGNOSIS — R600 Localized edema: Secondary | ICD-10-CM

## 2024-07-21 NOTE — Telephone Encounter (Signed)
 Tried to call pt and pt spouse x2, no answer and patient phone went directly to voicemail. LVM for patient to CB to schedule at [facility]. GS would like DVT US  done on Monday, 07/25/24 or Tuesday, 07/26/24).  fu Left Leg DVT see FB/BP/GS (per secure chat per GS/Dr. Glenard)

## 2024-07-21 NOTE — Progress Notes (Signed)
 Name: Becky Trevino   MRN: [id number]    DOB: 06-21-1946   Date:07/21/2024       Progress Note  Subjective  Chief Complaint  Chief Complaint  Patient presents with   Edema    Pain & swelling in left leg, requesting medication from PCP. States ortho doctor told her to reach out to PCP    Discussed the use of AI scribe software for clinical note transcription with the patient, who gave verbal consent to proceed.  History of Present Illness Becky Trevino is a 78 year old female with a history of deep venous thrombosis who presents with swelling in the left leg.  She has been experiencing swelling in her left leg for the past two to three weeks, primarily in the thigh, extending from the knee to the groin. The swelling has gradually increased over time and is associated with a nagging pain that occurs intermittently. She is concerned because her right thigh has been swollen for years but the left leg is the same size as the right now  She has a history of deep venous thrombosis in the right leg. She was previously given steroids for her right knee by her orthopedic doctor, but no treatment has been provided for the current swelling in the left leg.  She experiences slight shortness of breath, particularly when walking but stable. 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) 12/01/2022   Interstitial lung disease (HCC) 12/01/2022   Atherosclerosis of native arteries of extremity with intermittent claudication 10/16/2022   Anemia due to chronic kidney disease 08/26/2022   Hypothyroidism (acquired) 01/27/2022   PAD  (peripheral artery disease) 09/27/2021   AF (paroxysmal atrial fibrillation) (HCC) 09/15/2021   Primary osteoarthritis of right hip 07/18/2021   Atherosclerosis of aorta 06/07/2021   Mild protein-calorie malnutrition    Hyperbilirubinemia    Injury of left femoral nerve 08/28/2020   Lymphedema 06/25/2020   History of pelvic hematoma 02/10/2020   History of deep vein thrombosis 10/30/2019   Normocytic anemia 08/07/2019   Chronic infection of right knee (HCC) 11/25/2018   Infection and inflammatory reaction due to internal joint prosthesis, subsequent encounter 01/25/2018   Infection of total right knee replacement 11/19/2017   Type 2 diabetes mellitus with stage 3b chronic kidney disease, without long-term current use of insulin  (HCC) 11/06/2016   Primary osteoarthritis of knee 03/11/2016   Primary osteoarthritis of left hip 12/11/2015   Essential hypertension 04/21/2015   Bilateral lower extremity edema 04/21/2015   Chronic kidney disease (CKD), stage III (moderate) (HCC) 04/21/2015   Chronic venous insufficiency 04/21/2015   DDD (degenerative disc disease), cervical 04/21/2015   Diabetes mellitus with renal manifestation (HCC) 04/21/2015   Dyslipidemia 04/21/2015   Bladder cystocele 04/21/2015   Urinary incontinence 04/21/2015   Leg varices 04/21/2015   Insomnia 04/21/2015   Eczema intertrigo 04/21/2015   Morbid obesity (HCC) 04/21/2015   OSA (obstructive sleep apnea) 04/21/2015   Osteoarthritis of multiple joints 04/21/2015   H/O Spinal surgery 11/14/2013   Detrusor muscle hypertonia 11/07/2013    Social History   Tobacco Use   Smoking status:  Never    Passive exposure: Never   Smokeless tobacco: Never  Substance Use Topics   Alcohol use: No    Alcohol/week: 0.0 standard drinks of alcohol     Current Outpatient Medications:    ACCU-CHEK GUIDE test strip, TEST ONCE A DAY AS DIRECTED, Disp: 50 strip, Rfl: 1   acetaminophen  (TYLENOL ) 650 MG CR tablet, Take 650-1,300  mg by mouth 2 (two) times daily as needed for pain., Disp: , Rfl:    amiodarone  (PACERONE ) 200 MG tablet, Take 200 mg by mouth daily., Disp: , Rfl:    amoxicillin  (AMOXIL ) 500 MG capsule, Take 1 capsule (500 mg total) by mouth 2 (two) times daily., Disp: 60 capsule, Rfl: 12   apixaban  (ELIQUIS ) 5 MG TABS tablet, Take 1 tablet by mouth 2 (two) times daily., Disp: , Rfl:    atorvastatin  (LIPITOR) 40 MG tablet, TAKE 1 TABLET BY MOUTH EVERY DAY IN THE EVENING, Disp: 90 tablet, Rfl: 1   blood glucose meter kit and supplies, 1 each by Other route daily at 12 noon. Dispense based on patient and insurance preference. Check fsbs once a day, Disp: 1 each, Rfl: 0   Calcium  Carbonate-Vitamin D  600-400 MG-UNIT tablet, Take 1 tablet by mouth 2 (two) times daily., Disp: 60 tablet, Rfl: 0   dapagliflozin  propanediol (FARXIGA ) 10 MG TABS tablet, Take 1 tablet (10 mg total) by mouth daily before breakfast. Patient receives via [employer] Patient Assistance, Disp: 90 tablet, Rfl: 3   escitalopram  (LEXAPRO ) 5 MG tablet, TAKE 1 TABLET (5 MG TOTAL) BY MOUTH DAILY., Disp: 90 tablet, Rfl: 1   ferrous sulfate  325 (65 FE) MG EC tablet, Take 1 tablet (325 mg total) by mouth daily., Disp: , Rfl:    fluticasone  (FLONASE ) 50 MCG/ACT nasal spray, SPRAY 2 SPRAYS INTO EACH NOSTRIL EVERY DAY, Disp: 48 mL, Rfl: 0   hydroquinone 4 % cream, Apply topically at bedtime as needed., Disp: , Rfl:    ketoconazole  (NIZORAL ) 2 % cream, Apply 1 Application topically as needed for irritation., Disp: , Rfl:    latanoprost  (XALATAN ) 0.005 % ophthalmic solution, Place 1 drop into both eyes at bedtime., Disp: , Rfl:    levothyroxine  (SYNTHROID ) 75 MCG tablet, Take 1 tablet (75 mcg total) by mouth daily., Disp: 90 tablet, Rfl: 0   Magnesium  Oxide 500 MG CAPS, Take 500 mg by mouth daily. , Disp: , Rfl:    metoprolol  succinate (TOPROL -XL) 50 MG 24 hr tablet, Take 50 mg by mouth daily., Disp: , Rfl:    omeprazole  (PRILOSEC) 40 MG capsule, TAKE 1 CAPSULE (40  MG TOTAL) BY MOUTH DAILY., Disp: 30 capsule, Rfl: 1   oxybutynin  (DITROPAN -XL) 10 MG 24 hr tablet, Take 1 tablet (10 mg total) by mouth daily., Disp: 90 tablet, Rfl: 3   oxyCODONE -acetaminophen  (PERCOCET/ROXICET) 5-325 MG tablet, Take 1 tablet by mouth every 4 (four) hours as needed., Disp: , Rfl:    polyethylene glycol (MIRALAX  / GLYCOLAX ) 17 g packet, Take 17 g by mouth daily. (Patient taking differently: Take 17 g by mouth every other day.), Disp: 14 each, Rfl: 0   potassium chloride  (KLOR-CON ) 10 MEQ tablet, Take 10 mEq by mouth daily., Disp: , Rfl:    pregabalin  (LYRICA ) 50 MG capsule, Take 1 capsule (50 mg total) by mouth every evening., Disp: 90 capsule, Rfl: 0   SIMBRINZA 1-0.2 % SUSP, Apply 1 drop to eye 2 (two) times daily., Disp: , Rfl:    sodium chloride  (OCEAN) 0.65 % SOLN nasal spray, Place  1 spray into both nostrils 3 (three) times daily., Disp: 30 mL, Rfl: 0   torsemide  (DEMADEX ) 20 MG tablet, TAKE 1 TABLET BY MOUTH EVERY DAY, Disp: 90 tablet, Rfl: 1   traZODone  (DESYREL ) 50 MG tablet, TAKE 1/2 TO 1 TABLET (25-50 MG TOTAL) BY MOUTH AT BEDTIME AS NEEDED. FOR SLEEP (Patient not taking: Reported on 07/21/2024), Disp: 90 tablet, Rfl: 0  Allergies  Allergen Reactions   Lisinopril Swelling    ROS  Ten systems reviewed and is negative except as mentioned in HPI    Objective  Vitals:   07/21/24 1119  BP: 126/78  Pulse: 60  Resp: 16  SpO2: 98%  Weight: 267 lb (121.1 kg)  Height: 5' 11 (1.803 m)    Body mass index is 37.24 kg/m.    Physical Exam CONSTITUTIONAL: Patient appears well-developed and well-nourished. No distress. HEENT: Head atraumatic, normocephalic, neck supple. CARDIOVASCULAR: Normal rate, regular rhythm and normal heart sounds. No murmur heard.  BLE edema. No tenderness on palpation of extremities. PULMONARY: Effort normal. Crackles at lung bases. No respiratory distress. ABDOMINAL: There is no tenderness or distention. MUSCULOSKELETAL: Normal gait.  Without gross motor or sensory deficit. PSYCHIATRIC: Patient has a normal mood and affect. Behavior is normal. Judgment and thought content normal.  Recent Results (from the past 2160 hours)  Retic Panel     Status: Abnormal   Collection Time: 06/30/24 10:22 AM  Result Value Ref Range   Retic Ct Pct 2.0 0.4 - 3.1 %   RBC. 3.90 3.87 - 5.11 MIL/uL   Retic Count, Absolute 76.1 19.0 - 186.0 K/uL   Immature Retic Fract 21.4 (H) 2.3 - 15.9 %   Reticulocyte Hemoglobin 32.5 >27.9 pg    Comment:        Given the high negative predictive value of a RET-He result > 32 pg iron  deficiency is essentially excluded. If this patient is anemic other etiologies should be considered. Performed at [facility], [address] [place], [place] [address]   Ferritin     Status: None   Collection Time: 06/30/24 10:22 AM  Result Value Ref Range   Ferritin 185 11 - 307 ng/mL    Comment: Performed at [facility], [address] [place], [place] [address]  Iron  and TIBC     Status: Abnormal   Collection Time: 06/30/24 10:22 AM  Result Value Ref Range   Iron  47 28 - 170 ug/dL   TIBC 781 (L) 749 - 549 ug/dL   Saturation Ratios 22 10.4 - 31.8 %   UIBC 171 ug/dL    Comment: Performed at [facility], [address] [place], [place] [address]  CBC with Differential (Cancer Center Only)     Status: Abnormal   Collection Time: 06/30/24 10:22 AM  Result Value Ref Range   WBC Count 6.0 4.0 - 10.5 K/uL   RBC 3.93 3.87 - 5.11 MIL/uL   Hemoglobin 12.3 12.0 - 15.0 g/dL   HCT 61.5 63.9 - 53.9 %   MCV 97.7 80.0 - 100.0 fL   MCH 31.3 26.0 - 34.0 pg   MCHC 32.0 30.0 - 36.0 g/dL   RDW 82.5 (H) 88.4 - 84.4 %   Platelet Count 184 150 - 400 K/uL   nRBC 0.0 0.0 - 0.2 %   Neutrophils Relative % 64 %   Neutro Abs 3.9 1.7 - 7.7 K/uL   Lymphocytes Relative 21 %   Lymphs Abs 1.3 0.7 - 4.0 K/uL   Monocytes  Relative 9 %   Monocytes Absolute 0.6 0.1 - 1.0 K/uL   Eosinophils Relative 4 %    Eosinophils Absolute 0.2 0.0 - 0.5 K/uL   Basophils Relative 1 %   Basophils Absolute 0.0 0.0 - 0.1 K/uL   Immature Granulocytes 1 %   Abs Immature Granulocytes 0.04 0.00 - 0.07 K/uL    Comment: Performed at [facility], [address] [place], [place] [address]  POCT glycosylated hemoglobin (Hb A1C)     Status: Abnormal   Collection Time: 07/04/24 10:57 AM  Result Value Ref Range   Hemoglobin A1C 5.7 (A) 4.0 - 5.6 %   HbA1c POC (<> result, manual entry)     HbA1c, POC (prediabetic range)     HbA1c, POC (controlled diabetic range)    Comprehensive metabolic panel with GFR     Status: Abnormal   Collection Time: 07/04/24 11:28 AM  Result Value Ref Range   Glucose, Bld 84 65 - 99 mg/dL    Comment: .            Fasting reference interval .    BUN 16 7 - 25 mg/dL   Creat 8.69 (H) 9.39 - 1.00 mg/dL   eGFR 42 (L) > OR = 60 mL/min/1.53m2   BUN/Creatinine Ratio 12 6 - 22 (calc)   Sodium 141 135 - 146 mmol/L   Potassium 4.7 3.5 - 5.3 mmol/L   Chloride 105 98 - 110 mmol/L   CO2 28 20 - 32 mmol/L   Calcium  9.0 8.6 - 10.4 mg/dL   Total Protein 7.5 6.1 - 8.1 g/dL   Albumin 3.4 (L) 3.6 - 5.1 g/dL   Globulin 4.1 (H) 1.9 - 3.7 g/dL (calc)   AG Ratio 0.8 (L) 1.0 - 2.5 (calc)   Total Bilirubin 0.5 0.2 - 1.2 mg/dL   Alkaline phosphatase (APISO) 88 37 - 153 U/L   AST 26 10 - 35 U/L   ALT 15 6 - 29 U/L  TSH     Status: None   Collection Time: 07/04/24 11:28 AM  Result Value Ref Range   TSH 4.27 0.40 - 4.50 mIU/L  B Nat Peptide     Status: Abnormal   Collection Time: 07/04/24 12:01 PM  Result Value Ref Range   Brain Natriuretic Peptide 161 (H) <100 pg/mL    Comment: . BNP levels increase with age in the general population with the highest values seen in individuals greater than 9 years of age. Reference: J. Am. Penne. Cardiol. 2002; 59:023-017. .       Assessment & Plan  Bilateral lower extremity edema Swelling in the left thigh with a history of right leg DVT.  Differential includes fluid retention and vascular issues. - Ordered Doppler ultrasound of the left leg. - Contacted Dr. Maralee office for potential vascular studies. - Referred to vascular surgeon for further evaluation.  Shortness of breath on exertion with crackles on lung exam Shortness of breath with lung crackles. Differential includes fluid retention and cardiac issues. - Ordered BNP to assess for fluid retention and cardiac function.  Chronic kidney disease stage 3A - Continue monitoring kidney function through regular blood work.

## 2024-07-22 ENCOUNTER — Ambulatory Visit: Payer: Self-pay | Admitting: Family Medicine

## 2024-07-22 ENCOUNTER — Other Ambulatory Visit (INDEPENDENT_AMBULATORY_CARE_PROVIDER_SITE_OTHER): Payer: Self-pay | Admitting: Vascular Surgery

## 2024-07-22 DIAGNOSIS — Z95828 Presence of other vascular implants and grafts: Secondary | ICD-10-CM

## 2024-07-22 DIAGNOSIS — Z86718 Personal history of other venous thrombosis and embolism: Secondary | ICD-10-CM

## 2024-07-22 LAB — BRAIN NATRIURETIC PEPTIDE: Brain Natriuretic Peptide: 113 pg/mL — ABNORMAL HIGH (ref <100)

## 2024-07-25 ENCOUNTER — Encounter (INDEPENDENT_AMBULATORY_CARE_PROVIDER_SITE_OTHER)

## 2024-07-26 ENCOUNTER — Ambulatory Visit (INDEPENDENT_AMBULATORY_CARE_PROVIDER_SITE_OTHER): Admitting: Vascular Surgery

## 2024-07-26 NOTE — Progress Notes (Unsigned)
 07/28/2024 1:30 PM   Becky Trevino 04/13/1946 [id number]  Referring provider: Glenard Mire, MD [address] [place],  [place] [address]  Urological history: 1. Mixed incontinence -contributing factors of age, obesity, sleep apnea (non-compliant), anxiety, depression, cystocele, diuretics, DM and DDD -Myrbetriq  50 mg daily, cost prohibitive -oxybutynin  XL 10 mg daily   2. Right renal cyst -RUS (07/2022) 1.6 cm benign simple cyst in the superior pole   No chief complaint on file.   HPI: Becky Trevino is a 78 y.o. female who presents today for 12 month follow up.  Previous records reviewed.   They are having (1 to 7) or (8 or more) daytime voids,  they are having nocturia (1-2) or (3 or more) and urgency is (none, mild, strong, severe).   They are having (stress, urge or mixed incontinence.)    they are having urinary leakage (1-2 times weekly, 3 or more times weekly, 1-2 times daily and 3 or more times daily) They are using absorbent products for leakage (no, sometimes, always )   the type of products they use are (panty liners, absorbant pads, depends) *** daily.  They are not limiting fluids.  They are not engaging in toilet mapping  ***  UA ***  PVR ***  Serum creatinine 1.30, eGFR 42  Hemoglobin A1c 5.7  OAB agent: oxybutynin  XL 10 mg daily   Diuretics: Toresmide 20 mg    Fluid consumption ***  PMH: Past Medical History:  Diagnosis Date   Cervical spondylosis    Chronic kidney disease    Stage 3   DDD (degenerative disc disease), cervical    [facility]   Diabetes mellitus without complication (HCC)    Diverticulosis    Hyperlipidemia    Hypertension    Intertrigo    Leg cramps    Leg varices    Obesity    OSA (obstructive sleep apnea)    Symptomatic menopausal or female climacteric states    Syncope     Surgical History: Past Surgical History:  Procedure Laterality Date   ABDOMINAL HYSTERECTOMY  1993   Total    BUNIONECTOMY Bilateral 1993   ESOPHAGOGASTRODUODENOSCOPY (EGD) WITH PROPOFOL  N/A 05/13/2023   Procedure: ESOPHAGOGASTRODUODENOSCOPY (EGD) WITH PROPOFOL ;  Surgeon: Therisa Bi, MD;  Location: [facility] ENDOSCOPY;  Service: Gastroenterology;  Laterality: N/A;   I & D KNEE WITH POLY EXCHANGE Right 11/19/2017   Procedure: RIGHT KNEE POLY EXCHANGE WITH IRRIGATION AND DEBRIDEMENT;  Surgeon: Kathlynn Sharper, MD;  Location: [facility] ORS;  Service: Orthopedics;  Laterality: Right;   IR FLUORO GUIDED NEEDLE PLC ASPIRATION/INJECTION LOC  09/19/2021   JOINT REPLACEMENT     bilateral knee   LAMINECTOMY  11/14/2013    Cervical Fusion , [facility], Dr. Oma   LASER ABLATION Bilateral 07/29/2012   Dr. Marea   LOWER EXTREMITY ANGIOGRAPHY Right 11/02/2019   Procedure: Lower Extremity Angiography;  Surgeon: Jama Cordella MATSU, MD;  Location: [facility] INVASIVE CV LAB;  Service: Cardiovascular;  Laterality: Right;   ORIF ANKLE FRACTURE Right 09/02/2019   Procedure: OPEN REDUCTION INTERNAL FIXATION (ORIF) ANKLE FRACTURE BIMALLEOLAR;  Surgeon: Lennie Barter, DPM;  Location: [facility] ORS;  Service: Podiatry;  Laterality: Right;   PERIPHERAL VASCULAR THROMBECTOMY Right 03/02/2020   Procedure: PERIPHERAL VASCULAR THROMBECTOMY;  Surgeon: Marea Selinda RAMAN, MD;  Location: [facility] INVASIVE CV LAB;  Service: Cardiovascular;  Laterality: Right;   REPLACEMENT TOTAL KNEE Left 03/2010   Dr. Kathlynn   TEE WITHOUT CARDIOVERSION N/A 09/19/2021   Procedure: TRANSESOPHAGEAL  ECHOCARDIOGRAM (TEE);  Surgeon: Hester Wolm PARAS, MD;  Location: [facility] ORS;  Service: Cardiovascular;  Laterality: N/A;   TOTAL HIP ARTHROPLASTY Left 12/11/2015   Procedure: TOTAL HIP ARTHROPLASTY ANTERIOR APPROACH;  Surgeon: Ozell Flake, MD;  Location: [facility] ORS;  Service: Orthopedics;  Laterality: Left;   TOTAL KNEE ARTHROPLASTY Right 03/11/2016   Procedure: TOTAL KNEE ARTHROPLASTY;  Surgeon: Ozell Flake, MD;  Location: [facility] ORS;  Service: Orthopedics;  Laterality: Right;    Home Medications:   Allergies as of 07/28/2024       Reactions   Lisinopril Swelling        Medication List        Accurate as of July 26, 2024  1:30 PM. If you have any questions, ask your nurse or doctor.          Accu-Chek Guide test strip Generic drug: glucose blood TEST ONCE A DAY AS DIRECTED   acetaminophen  650 MG CR tablet Commonly known as: TYLENOL  Take 650-1,300 mg by mouth 2 (two) times daily as needed for pain.   amiodarone  200 MG tablet Commonly known as: PACERONE  Take 200 mg by mouth daily.   amoxicillin  500 MG capsule Commonly known as: AMOXIL  Take 1 capsule (500 mg total) by mouth 2 (two) times daily.   apixaban  5 MG Tabs tablet Commonly known as: ELIQUIS  Take 1 tablet by mouth 2 (two) times daily.   atorvastatin  40 MG tablet Commonly known as: LIPITOR TAKE 1 TABLET BY MOUTH EVERY DAY IN THE EVENING   blood glucose meter kit and supplies 1 each by Other route daily at 12 noon. Dispense based on patient and insurance preference. Check fsbs once a day   Calcium  Carbonate-Vitamin D  600-400 MG-UNIT tablet Take 1 tablet by mouth 2 (two) times daily.   dapagliflozin  propanediol 10 MG Tabs tablet Commonly known as: Farxiga  Take 1 tablet (10 mg total) by mouth daily before breakfast. Patient receives via [employer] Patient Assistance   escitalopram  5 MG tablet Commonly known as: LEXAPRO  TAKE 1 TABLET (5 MG TOTAL) BY MOUTH DAILY.   ferrous sulfate  325 (65 FE) MG EC tablet Take 1 tablet (325 mg total) by mouth daily.   fluticasone  50 MCG/ACT nasal spray Commonly known as: FLONASE  SPRAY 2 SPRAYS INTO EACH NOSTRIL EVERY DAY   hydroquinone 4 % cream Apply topically at bedtime as needed.   ketoconazole  2 % cream Commonly known as: NIZORAL  Apply 1 Application topically as needed for irritation.   latanoprost  0.005 % ophthalmic solution Commonly known as: XALATAN  Place 1 drop into both eyes at bedtime.   levothyroxine  75 MCG tablet Commonly known as:  SYNTHROID  Take 1 tablet (75 mcg total) by mouth daily.   Magnesium  Oxide -Mg Supplement 500 MG Caps Take 500 mg by mouth daily.   metoprolol  succinate 50 MG 24 hr tablet Commonly known as: TOPROL -XL Take 50 mg by mouth daily.   omeprazole  40 MG capsule Commonly known as: PRILOSEC TAKE 1 CAPSULE (40 MG TOTAL) BY MOUTH DAILY.   oxybutynin  10 MG 24 hr tablet Commonly known as: DITROPAN -XL Take 1 tablet (10 mg total) by mouth daily.   oxyCODONE -acetaminophen  5-325 MG tablet Commonly known as: PERCOCET/ROXICET Take 1 tablet by mouth every 4 (four) hours as needed.   polyethylene glycol 17 g packet Commonly known as: MIRALAX  / GLYCOLAX  Take 17 g by mouth daily. What changed: when to take this   potassium chloride  10 MEQ tablet Commonly known as: KLOR-CON  Take 10 mEq by mouth daily.   pregabalin  50  MG capsule Commonly known as: LYRICA  Take 1 capsule (50 mg total) by mouth every evening.   Simbrinza 1-0.2 % Susp Generic drug: Brinzolamide -Brimonidine  Apply 1 drop to eye 2 (two) times daily.   sodium chloride  0.65 % Soln nasal spray Commonly known as: OCEAN Place 1 spray into both nostrils 3 (three) times daily.   torsemide  20 MG tablet Commonly known as: DEMADEX  TAKE 1 TABLET BY MOUTH EVERY DAY   traZODone  50 MG tablet Commonly known as: DESYREL  TAKE 1/2 TO 1 TABLET (25-50 MG TOTAL) BY MOUTH AT BEDTIME AS NEEDED. FOR SLEEP        Allergies:  Allergies  Allergen Reactions   Lisinopril Swelling    Family History: Family History  Problem Relation Age of Onset   Diabetes Mother    Hyperlipidemia Mother    Hypertension Mother    Diabetes Father    Hyperlipidemia Father    Hypertension Father    Obesity Father    Hypertension Sister    Hyperlipidemia Sister    Hyperlipidemia Sister    Breast cancer Neg Hx     Social History:  reports that she has never smoked. She has never been exposed to tobacco smoke. She has never used smokeless tobacco. She reports  that she does not drink alcohol and does not use drugs.  ROS: For pertinent review of systems please refer to history of present illness  Physical Exam: There were no vitals taken for this visit.  Constitutional:  Well nourished. Alert and oriented, No acute distress. HEENT: [place] AT, moist mucus membranes.  Trachea midline, no masses. Cardiovascular: No clubbing, cyanosis, or edema. Respiratory: Normal respiratory effort, no increased work of breathing. GU: No CVA tenderness.  No bladder fullness or masses.  Recession of labia minora, dry, pale vulvar vaginal mucosa and loss of mucosal ridges and folds.  Normal urethral meatus, no lesions, no prolapse, no discharge.   No urethral masses, tenderness and/or tenderness. No bladder fullness, tenderness or masses. *** vagina mucosa, *** estrogen effect, no discharge, no lesions, *** pelvic support, *** cystocele and *** rectocele noted.  No cervical motion tenderness.  Uterus is freely mobile and non-fixed.  No adnexal/parametria masses or tenderness noted.  Anus and perineum are without rashes or lesions.   ***  Neurologic: Grossly intact, no focal deficits, moving all 4 extremities. Psychiatric: Normal mood and affect.    Laboratory Data: See Epic and HPI   I have reviewed the labs.  Pertinent Imaging  N/A  Assessment & Plan:    1. Mixed incontinence -At goal on oxybutynin  XL 10 mg daily -Continue the medication refill sent to pharmacy  2. Nocturia -at goal with oxybutynin  XL 10 mg daily  No follow-ups on file.  These notes generated with voice recognition software. I apologize for typographical errors.  CLOTILDA HELON RIGGERS  [facility] [address] [place], [place] [address] [phone]

## 2024-07-27 ENCOUNTER — Ambulatory Visit (INDEPENDENT_AMBULATORY_CARE_PROVIDER_SITE_OTHER)

## 2024-07-27 DIAGNOSIS — Z86718 Personal history of other venous thrombosis and embolism: Secondary | ICD-10-CM

## 2024-07-27 DIAGNOSIS — Z95828 Presence of other vascular implants and grafts: Secondary | ICD-10-CM | POA: Diagnosis not present

## 2024-07-28 ENCOUNTER — Ambulatory Visit: Admitting: Urology

## 2024-07-28 ENCOUNTER — Encounter: Payer: Self-pay | Admitting: Urology

## 2024-07-28 VITALS — BP 121/79 | HR 66 | Ht 71.0 in | Wt 266.0 lb

## 2024-07-28 DIAGNOSIS — N3946 Mixed incontinence: Secondary | ICD-10-CM | POA: Diagnosis not present

## 2024-07-28 LAB — MICROSCOPIC EXAMINATION
Epithelial Cells (non renal): >10 /HPF — AB (ref 0–10)
RBC, Urine: NONE SEEN /HPF (ref 0–2)

## 2024-07-28 LAB — URINALYSIS, COMPLETE
Bilirubin, UA: NEGATIVE
Ketones, UA: NEGATIVE
Leukocytes,UA: NEGATIVE
Nitrite, UA: NEGATIVE
Protein,UA: NEGATIVE
RBC, UA: NEGATIVE
Specific Gravity, UA: 1.01 (ref 1.005–1.030)
Urobilinogen, Ur: 0.2 mg/dL (ref 0.2–1.0)
pH, UA: 6.5 (ref 5.0–7.5)

## 2024-07-28 MED ORDER — OXYBUTYNIN CHLORIDE ER 15 MG PO TB24: 15.0000 mg | ORAL_TABLET | Freq: Every day | ORAL | 3 refills | Status: DC

## 2024-07-29 ENCOUNTER — Encounter (INDEPENDENT_AMBULATORY_CARE_PROVIDER_SITE_OTHER): Payer: Self-pay | Admitting: Vascular Surgery

## 2024-07-29 ENCOUNTER — Ambulatory Visit (INDEPENDENT_AMBULATORY_CARE_PROVIDER_SITE_OTHER): Admitting: Vascular Surgery

## 2024-07-29 VITALS — BP 124/65 | HR 55 | Resp 16

## 2024-07-29 DIAGNOSIS — Z86718 Personal history of other venous thrombosis and embolism: Secondary | ICD-10-CM

## 2024-07-29 DIAGNOSIS — I890 Lymphedema, not elsewhere classified: Secondary | ICD-10-CM | POA: Diagnosis not present

## 2024-07-29 NOTE — Progress Notes (Signed)
 Subjective:    Patient ID: Becky Trevino, female    DOB: [birth year], 78 y.o.   MRN: [id number] Chief Complaint  Patient presents with   Follow-up    Ref [facility] consult left leg dvt u/s     Becky Trevino is a 78 yo female who presents to clinic today for concerns of a left lower extremity DVT.  Patient underwent venous ultrasounds today which showed no DVTs to either extremity.  However she does have a long history of chronic vascular insufficiency with lymphedema.  Both lower extremities are +3 to +4 edema.  No noted ulcers or sores today.  She is wearing her compression socks that she is asked to do.  She has a lymphedema pump at home which she states she is using twice a day.  She remains morbidly obese and can only walk very small short rooms.  She is not exercising daily.  She states that she tries to elevate her legs is much as she can.    Review of Systems  Constitutional: Negative.   Cardiovascular:  Positive for leg swelling.  Musculoskeletal:  Positive for myalgias.  All other systems reviewed and are negative.      Objective:   Physical Exam Constitutional:      Appearance: Normal appearance. She is obese.  HENT:     Head: Normocephalic.  Eyes:     Pupils: Pupils are equal, round, and reactive to light.  Cardiovascular:     Rate and Rhythm: Normal rate and regular rhythm.     Pulses: Normal pulses.     Heart sounds: Normal heart sounds.  Pulmonary:     Effort: Pulmonary effort is normal.     Breath sounds: Normal breath sounds.  Abdominal:     General: Bowel sounds are normal.     Palpations: Abdomen is soft.  Musculoskeletal:        General: Swelling and tenderness present.     Right lower leg: Edema present.     Left lower leg: Edema present.  Skin:    General: Skin is warm and dry.     Capillary Refill: Capillary refill takes 2 to 3 seconds.  Neurological:     General: No focal deficit present.     Mental Status: She is alert and oriented to person,  place, and time. Mental status is at baseline.  Psychiatric:        Mood and Affect: Mood normal.        Behavior: Behavior normal.        Thought Content: Thought content normal.        Judgment: Judgment normal.     BP 124/65 (BP Location: Left Arm)   Pulse (!) 55   Resp 16   Past Medical History:  Diagnosis Date   Cervical spondylosis    Chronic kidney disease    Stage 3   DDD (degenerative disc disease), cervical    [facility]   Diabetes mellitus without complication (HCC)    Diverticulosis    Hyperlipidemia    Hypertension    Intertrigo    Leg cramps    Leg varices    Obesity    OSA (obstructive sleep apnea)    Symptomatic menopausal or female climacteric states    Syncope     Social History   Socioeconomic History   Marital status: Married    Spouse name: Fardowsa Authier   Number of children: 1   Years of education: Not on file  Highest education level: Not on file  Occupational History   Occupation: Retired  Tobacco Use   Smoking status: Never    Passive exposure: Never   Smokeless tobacco: Never  Vaping Use   Vaping status: Never Used  Substance and Sexual Activity   Alcohol use: No    Alcohol/week: 0.0 standard drinks of alcohol   Drug use: No   Sexual activity: Yes    Partners: Male  Other Topics Concern   Not on file  Social History Narrative   Married, one adopted grown child    Social Drivers of Corporate Investment Banker Strain: Low Risk  (06/06/2024)   Received from [facility]   Overall Financial Resource Strain (CARDIA)    Difficulty of Paying Living Expenses: Not very hard  Food Insecurity: No Food Insecurity (06/06/2024)   Received from [facility]   Hunger Vital Sign    Within the past 12 months, you worried that your food would run out before you got the money to buy more.: Never true    Within the past 12 months, the food you bought just didn't last and you didn't have money to get  more.: Never true  Transportation Needs: No Transportation Needs (06/06/2024)   Received from [facility] - Transportation    In the past 12 months, has lack of transportation kept you from medical appointments or from getting medications?: No    Lack of Transportation (Non-Medical): No  Physical Activity: Insufficiently Active (08/06/2023)   Exercise Vital Sign    Days of Exercise per Week: 5 days    Minutes of Exercise per Session: 20 min  Stress: No Stress Concern Present (08/06/2023)   [employer] of Occupational Health - Occupational Stress Questionnaire    Feeling of Stress : Not at all  Social Connections: Moderately Integrated (08/06/2023)   Social Connection and Isolation Panel    Frequency of Communication with Friends and Family: More than three times a week    Frequency of Social Gatherings with Friends and Family: Once a week    Attends Religious Services: More than 4 times per year    Active Member of [employer] or Organizations: No    Attends Banker Meetings: Never    Marital Status: Married  Catering Manager Violence: Not At Risk (08/17/2023)   Humiliation, Afraid, Rape, and Kick questionnaire    Fear of Current or Ex-Partner: No    Emotionally Abused: No    Physically Abused: No    Sexually Abused: No    Past Surgical History:  Procedure Laterality Date   ABDOMINAL HYSTERECTOMY  1993   Total   BUNIONECTOMY Bilateral 1993   ESOPHAGOGASTRODUODENOSCOPY (EGD) WITH PROPOFOL  N/A 05/13/2023   Procedure: ESOPHAGOGASTRODUODENOSCOPY (EGD) WITH PROPOFOL ;  Surgeon: Therisa Bi, MD;  Location: [facility] ENDOSCOPY;  Service: Gastroenterology;  Laterality: N/A;   I & D KNEE WITH POLY EXCHANGE Right 11/19/2017   Procedure: RIGHT KNEE POLY EXCHANGE WITH IRRIGATION AND DEBRIDEMENT;  Surgeon: Kathlynn Sharper, MD;  Location: [facility] ORS;  Service: Orthopedics;  Laterality: Right;   IR FLUORO GUIDED NEEDLE PLC ASPIRATION/INJECTION LOC  09/19/2021   JOINT  REPLACEMENT     bilateral knee   LAMINECTOMY  11/14/2013    Cervical Fusion , [facility], Dr. Oma   LASER ABLATION Bilateral 07/29/2012   Dr. Marea   LOWER EXTREMITY ANGIOGRAPHY Right 11/02/2019   Procedure: Lower Extremity Angiography;  Surgeon: Jama Cordella MATSU, MD;  Location: [facility]  INVASIVE CV LAB;  Service: Cardiovascular;  Laterality: Right;   ORIF ANKLE FRACTURE Right 09/02/2019   Procedure: OPEN REDUCTION INTERNAL FIXATION (ORIF) ANKLE FRACTURE BIMALLEOLAR;  Surgeon: Lennie Barter, DPM;  Location: [facility] ORS;  Service: Podiatry;  Laterality: Right;   PERIPHERAL VASCULAR THROMBECTOMY Right 03/02/2020   Procedure: PERIPHERAL VASCULAR THROMBECTOMY;  Surgeon: Marea Selinda RAMAN, MD;  Location: [facility] INVASIVE CV LAB;  Service: Cardiovascular;  Laterality: Right;   REPLACEMENT TOTAL KNEE Left 03/2010   Dr. Kathlynn   TEE WITHOUT CARDIOVERSION N/A 09/19/2021   Procedure: TRANSESOPHAGEAL ECHOCARDIOGRAM (TEE);  Surgeon: Hester Wolm PARAS, MD;  Location: [facility] ORS;  Service: Cardiovascular;  Laterality: N/A;   TOTAL HIP ARTHROPLASTY Left 12/11/2015   Procedure: TOTAL HIP ARTHROPLASTY ANTERIOR APPROACH;  Surgeon: Ozell Kathlynn, MD;  Location: [facility] ORS;  Service: Orthopedics;  Laterality: Left;   TOTAL KNEE ARTHROPLASTY Right 03/11/2016   Procedure: TOTAL KNEE ARTHROPLASTY;  Surgeon: Ozell Kathlynn, MD;  Location: [facility] ORS;  Service: Orthopedics;  Laterality: Right;    Family History  Problem Relation Age of Onset   Diabetes Mother    Hyperlipidemia Mother    Hypertension Mother    Diabetes Father    Hyperlipidemia Father    Hypertension Father    Obesity Father    Hypertension Sister    Hyperlipidemia Sister    Hyperlipidemia Sister    Breast cancer Neg Hx     Allergies  Allergen Reactions   Lisinopril Swelling       Latest Ref Rng & Units 06/30/2024   10:22 AM 12/28/2023   10:29 AM 06/29/2023    9:59 AM  CBC  WBC 4.0 - 10.5 K/uL 6.0  4.8  4.8   Hemoglobin 12.0 - 15.0 g/dL 87.6  87.8  87.7    Hematocrit 36.0 - 46.0 % 38.4  38.4  38.8   Platelets 150 - 400 K/uL 184  192  179       CMP     Component Value Date/Time   NA 141 07/04/2024 1128   NA 142 11/26/2015 1122   NA 141 10/18/2012 1716   K 4.7 07/04/2024 1128   K 3.5 10/18/2012 1716   CL 105 07/04/2024 1128   CL 110 (H) 10/18/2012 1716   CO2 28 07/04/2024 1128   CO2 24 10/18/2012 1716   GLUCOSE 84 07/04/2024 1128   GLUCOSE 128 (H) 10/18/2012 1716   BUN 16 07/04/2024 1128   BUN 27 11/26/2015 1122   BUN 31 (H) 10/18/2012 1716   CREATININE 1.30 (H) 07/04/2024 1128   CALCIUM  9.0 07/04/2024 1128   CALCIUM  7.9 (L) 10/18/2012 1716   PROT 7.5 07/04/2024 1128   PROT 7.6 11/26/2015 1122   PROT 8.0 05/22/2012 2206   ALBUMIN 3.4 (L) 11/07/2022 0955   ALBUMIN 4.3 11/26/2015 1122   ALBUMIN 3.4 05/22/2012 2206   AST 26 07/04/2024 1128   AST 34 05/22/2012 2206   ALT 15 07/04/2024 1128   ALT 31 05/22/2012 2206   ALKPHOS 94 11/07/2022 0955   ALKPHOS 115 05/22/2012 2206   BILITOT 0.5 07/04/2024 1128   BILITOT 0.3 11/26/2015 1122   BILITOT 0.3 05/22/2012 2206   EGFR 42 (L) 07/04/2024 1128   GFRNONAA 37 (L) 11/07/2022 0955   GFRNONAA 41 (L) 02/04/2021 1123     No results found.     Assessment & Plan:   1. History of DVT (deep vein thrombosis) (Primary) Patient has a history of deep vein thrombosis and was sent to see us  for questionable  return of a deep vein thrombosis to her left lower extremity.  Venous ultrasounds were done today and showed no evidence of DVT in her left lower extremity.  2. Lymphedema Patient is noted to have +3 to +4 bilateral lower extremity lymphedema.  This is a chronic condition for her.  She currently is wearing compression socks to bilateral lower extremities.  She states she tries to elevate her legs is much as possible.  She uses a motorized wheelchair and does not really ambulate much.  She endorses she tries to get around the house but I believe she may be unsuccessful doing so.  She  has a lymphedema pump which she endorses she is using twice a day.  Vascular surgery recommends at this time that she continue to do all of the conventional therapies such as compression, rest, elevation and exercise.  We also recommend that she continue to use her lymphedema pump 3-4 times a day instead of twice a day.  Also recommended highly to her that she raise her legs at all times when she is sitting even if she is riding her scooter.  She also remains morbidly obese at 266 pounds and a BMI of 37.10.  I had a 10-minute conversation with her regarding her weight and how it is affecting the swelling to her lower extremities as well as her hips and her abdomen.  She verbalizes her understanding and says she is working on it.  Patient states she has a 1 year follow-up so already set up with Orvin Daring, NP of vein and vascular surgery.  Therefore will not make her another appointment at this time we will follow-up as scheduled.   Current Outpatient Medications on File Prior to Visit  Medication Sig Dispense Refill   ACCU-CHEK GUIDE test strip TEST ONCE A DAY AS DIRECTED 50 strip 1   acetaminophen  (TYLENOL ) 650 MG CR tablet Take 650-1,300 mg by mouth 2 (two) times daily as needed for pain.     amiodarone  (PACERONE ) 200 MG tablet Take 200 mg by mouth daily.     amoxicillin  (AMOXIL ) 500 MG capsule Take 1 capsule (500 mg total) by mouth 2 (two) times daily. 60 capsule 12   apixaban  (ELIQUIS ) 5 MG TABS tablet Take 1 tablet by mouth 2 (two) times daily.     atorvastatin  (LIPITOR) 40 MG tablet TAKE 1 TABLET BY MOUTH EVERY DAY IN THE EVENING 90 tablet 1   blood glucose meter kit and supplies 1 each by Other route daily at 12 noon. Dispense based on patient and insurance preference. Check fsbs once a day 1 each 0   Calcium  Carbonate-Vitamin D  600-400 MG-UNIT tablet Take 1 tablet by mouth 2 (two) times daily. 60 tablet 0   dapagliflozin  propanediol (FARXIGA ) 10 MG TABS tablet Take 1 tablet (10 mg total)  by mouth daily before breakfast. Patient receives via [place] Patient Assistance 90 tablet 3   escitalopram  (LEXAPRO ) 5 MG tablet TAKE 1 TABLET (5 MG TOTAL) BY MOUTH DAILY. 90 tablet 1   ferrous sulfate  325 (65 FE) MG EC tablet Take 1 tablet (325 mg total) by mouth daily.     fluticasone  (FLONASE ) 50 MCG/ACT nasal spray SPRAY 2 SPRAYS INTO EACH NOSTRIL EVERY DAY 48 mL 0   hydroquinone 4 % cream Apply topically at bedtime as needed.     ketoconazole  (NIZORAL ) 2 % cream Apply 1 Application topically as needed for irritation.     latanoprost  (XALATAN ) 0.005 % ophthalmic solution Place 1 drop into both  eyes at bedtime.     levothyroxine  (SYNTHROID ) 75 MCG tablet Take 1 tablet (75 mcg total) by mouth daily. 90 tablet 0   Magnesium  Oxide 500 MG CAPS Take 500 mg by mouth daily.      metoprolol  succinate (TOPROL -XL) 50 MG 24 hr tablet Take 50 mg by mouth daily.     omeprazole  (PRILOSEC) 40 MG capsule TAKE 1 CAPSULE (40 MG TOTAL) BY MOUTH DAILY. 30 capsule 1   oxybutynin  (DITROPAN  XL) 15 MG 24 hr tablet Take 1 tablet (15 mg total) by mouth daily. 30 tablet 3   oxyCODONE -acetaminophen  (PERCOCET/ROXICET) 5-325 MG tablet Take 1 tablet by mouth every 4 (four) hours as needed.     polyethylene glycol (MIRALAX  / GLYCOLAX ) 17 g packet Take 17 g by mouth daily. (Patient taking differently: Take 17 g by mouth every other day.) 14 each 0   potassium chloride  (KLOR-CON ) 10 MEQ tablet Take 10 mEq by mouth daily.     pregabalin  (LYRICA ) 50 MG capsule Take 1 capsule (50 mg total) by mouth every evening. 90 capsule 0   SIMBRINZA 1-0.2 % SUSP Apply 1 drop to eye 2 (two) times daily.     sodium chloride  (OCEAN) 0.65 % SOLN nasal spray Place 1 spray into both nostrils 3 (three) times daily. 30 mL 0   torsemide  (DEMADEX ) 20 MG tablet TAKE 1 TABLET BY MOUTH EVERY DAY 90 tablet 1   No current facility-administered medications on file prior to visit.    There are no Patient Instructions on file for this visit. No follow-ups  on file.   Becky JONELLE Shank, NP

## 2024-07-30 ENCOUNTER — Other Ambulatory Visit: Payer: Self-pay | Admitting: Physician Assistant

## 2024-07-30 DIAGNOSIS — K219 Gastro-esophageal reflux disease without esophagitis: Secondary | ICD-10-CM

## 2024-08-01 ENCOUNTER — Telehealth: Payer: Self-pay | Admitting: Family Medicine

## 2024-08-01 NOTE — Telephone Encounter (Signed)
 Pt went to [facility] and they told her that there was no blood clot and that every thing looked good. That she may have fluid in the knee and to return in 12 weeks. She also states that her other knee is now hurting and would like to know if you could please call her something in to [id number]

## 2024-08-05 ENCOUNTER — Telehealth: Payer: Self-pay | Admitting: Family Medicine

## 2024-08-05 ENCOUNTER — Other Ambulatory Visit: Payer: Self-pay | Admitting: Family Medicine

## 2024-08-05 DIAGNOSIS — G8929 Other chronic pain: Secondary | ICD-10-CM

## 2024-08-05 NOTE — Telephone Encounter (Signed)
 Copied from CRM [id number]. Topic: Clinical - Prescription Issue >> Aug 05, 2024  9:54 AM Becky Trevino wrote: Reason for CRM: Pt is calling to confirm [name] sent in a request for medication for her knee to PCP. I do show she sent a message to pt stating medication has been sent for refill, but I don't see no updates on medication list. Please advise pt # [phone]

## 2024-08-05 NOTE — Telephone Encounter (Signed)
 Pt called wants to see about getting something for tooth

## 2024-08-05 NOTE — Telephone Encounter (Signed)
 Sorry I dont know where tooth came from? It was for her leg

## 2024-08-08 ENCOUNTER — Telehealth: Payer: Self-pay

## 2024-08-08 NOTE — Telephone Encounter (Signed)
 Daughter notified, and recommended to reach out to Dr. Kathlynn, but if afraid she has infection will need appt.

## 2024-08-08 NOTE — Telephone Encounter (Signed)
 Copied from CRM #[id number]. Topic: General - Other >> Aug 08, 2024  8:09 AM Charlet HERO wrote: Reason for CRM: Patient daughter wants to get  a call back about patients health and left knee PANG ROBERS, DAUGHTER, [phone]

## 2024-08-10 ENCOUNTER — Other Ambulatory Visit: Payer: Self-pay | Admitting: Family Medicine

## 2024-08-10 DIAGNOSIS — Z1231 Encounter for screening mammogram for malignant neoplasm of breast: Secondary | ICD-10-CM

## 2024-08-16 ENCOUNTER — Other Ambulatory Visit: Payer: Self-pay

## 2024-08-16 ENCOUNTER — Emergency Department
Admission: EM | Admit: 2024-08-16 | Discharge: 2024-08-17 | Disposition: A | Attending: Emergency Medicine | Admitting: Emergency Medicine

## 2024-08-16 ENCOUNTER — Telehealth: Payer: Self-pay

## 2024-08-16 ENCOUNTER — Emergency Department

## 2024-08-16 DIAGNOSIS — Z7901 Long term (current) use of anticoagulants: Secondary | ICD-10-CM | POA: Insufficient documentation

## 2024-08-16 DIAGNOSIS — E1122 Type 2 diabetes mellitus with diabetic chronic kidney disease: Secondary | ICD-10-CM | POA: Insufficient documentation

## 2024-08-16 DIAGNOSIS — I4891 Unspecified atrial fibrillation: Secondary | ICD-10-CM | POA: Insufficient documentation

## 2024-08-16 DIAGNOSIS — N189 Chronic kidney disease, unspecified: Secondary | ICD-10-CM | POA: Insufficient documentation

## 2024-08-16 DIAGNOSIS — I129 Hypertensive chronic kidney disease with stage 1 through stage 4 chronic kidney disease, or unspecified chronic kidney disease: Secondary | ICD-10-CM | POA: Insufficient documentation

## 2024-08-16 DIAGNOSIS — S82031A Displaced transverse fracture of right patella, initial encounter for closed fracture: Secondary | ICD-10-CM | POA: Insufficient documentation

## 2024-08-16 DIAGNOSIS — W010XXA Fall on same level from slipping, tripping and stumbling without subsequent striking against object, initial encounter: Secondary | ICD-10-CM | POA: Insufficient documentation

## 2024-08-16 DIAGNOSIS — R296 Repeated falls: Secondary | ICD-10-CM

## 2024-08-16 LAB — URINALYSIS, W/ REFLEX TO CULTURE (INFECTION SUSPECTED)
Bilirubin Urine: NEGATIVE
Glucose, UA: >=500 mg/dL — AB
Hgb urine dipstick: NEGATIVE
Ketones, ur: NEGATIVE mg/dL
Leukocytes,Ua: NEGATIVE
Nitrite: NEGATIVE
Protein, ur: NEGATIVE mg/dL
Specific Gravity, Urine: 1.013 (ref 1.005–1.030)
pH: 5 (ref 5.0–8.0)

## 2024-08-16 LAB — CBC
HCT: 39 % (ref 36.0–46.0)
Hemoglobin: 12.3 g/dL (ref 12.0–15.0)
MCH: 32.1 pg (ref 26.0–34.0)
MCHC: 31.5 g/dL (ref 30.0–36.0)
MCV: 101.8 fL — ABNORMAL HIGH (ref 80.0–100.0)
Platelets: 142 K/uL — ABNORMAL LOW (ref 150–400)
RBC: 3.83 MIL/uL — ABNORMAL LOW (ref 3.87–5.11)
RDW: 18.4 % — ABNORMAL HIGH (ref 11.5–15.5)
WBC: 5.7 K/uL (ref 4.0–10.5)
nRBC: 0 % (ref 0.0–0.2)

## 2024-08-16 LAB — BASIC METABOLIC PANEL WITH GFR
Anion gap: 10 (ref 5–15)
BUN: 33 mg/dL — ABNORMAL HIGH (ref 8–23)
CO2: 25 mmol/L (ref 22–32)
Calcium: 9 mg/dL (ref 8.9–10.3)
Chloride: 107 mmol/L (ref 98–111)
Creatinine, Ser: 1.74 mg/dL — ABNORMAL HIGH (ref 0.44–1.00)
GFR, Estimated: 30 mL/min — ABNORMAL LOW (ref >60)
Glucose, Bld: 93 mg/dL (ref 70–99)
Potassium: 4.7 mmol/L (ref 3.5–5.1)
Sodium: 142 mmol/L (ref 135–145)

## 2024-08-16 NOTE — ED Provider Notes (Addendum)
 Shared visit  Patient presents to the emergency department after sliding out of the walker and unable to ambulate.  Chronic pain and chronic injury to the left leg with nerve damage.  Patient's daughter states that she has had to call EMS every day since this weekend to help get her up.  States that she is unable to fit in her wheelchair and keep sliding out.  Recent fracture but unable to fit a boot on her leg.  Denies any significant chest pain or shortness of breath.  States that her symptoms have been progressively worsening over the past couple of days but have been a long chronic problem.  Denies dysuria.  Takes Eliquis   Plan for lab work.  CT scan and x-ray imaging obtained.  Known patellar fracture.  Otherwise no other acute traumatic injury.  No signs of intracranial hemorrhage.  Lab work overall close to her baseline.  Mild elevation of creatinine but no significant acute kidney injury.  Tolerating p.o.  Discussed boarding in the emergency department for further evaluation with social work, physical therapy and Occupational Therapy to discuss possible placement, rehab or home health.  Patient ultimately declined and wants to follow-up as an outpatient with her primary care physician.  Daughter is at bedside.  Discharged home in stable condition.   Suzanne Kirsch, MD 08/16/24 JOHNNYE    Suzanne Kirsch, MD 08/16/24 RONOLD

## 2024-08-16 NOTE — ED Notes (Signed)
 Patient placed on purewick at this time.

## 2024-08-16 NOTE — ED Triage Notes (Signed)
 Pt slid out of her bed onto the floor. EMS dispatched for lift assist. They brought her to the ER because she was unable to stand on her own with her walker at home. Pt has chronic pain and says her L knee hurts worse after the fall. Pt is on eliquis . She did not hit her head. She says she slid onto her knees from the bed because she was unable to push herself back up.

## 2024-08-16 NOTE — ED Provider Notes (Signed)
 [facility] Provider Note    Event Date/Time   First MD Initiated Contact with Patient 08/16/24 1532     (approximate)   History   Fall   HPI  Becky Trevino is a 78 y.o. female  with history of CKD, HTN, DM, a fib on Eliquis  and as listed in EMR presents to the emergency department for after she slipped, fell, and hit her head on the nightstand. No LOC. Complains of right knee pain. She has had multiple falls lately. Today, she was unable to get up on her own. Daughter states it is becoming more difficult to care for her due to the frequency of falls. Patient denies dizziness, shortness of breath, or chest pain.     Physical Exam    Vitals:   08/16/24 1509  BP: 124/75  Pulse: 60  Resp: 20  Temp: (!) 97.5 F (36.4 C)  SpO2: 94%    General: Awake, no distress.  CV:  Good peripheral perfusion.  Resp:  Normal effort.  Abd:  No distention.  Other:  Tenderness to the anterior aspect of the right knee. No open wounds.   ED Results / Procedures / Treatments   Labs (all labs ordered are listed, but only abnormal results are displayed)  Labs Reviewed  CBC - Abnormal; Notable for the following components:      Result Value   RBC 3.83 (*)    MCV 101.8 (*)    RDW 18.4 (*)    Platelets 142 (*)    All other components within normal limits  BASIC METABOLIC PANEL WITH GFR - Abnormal; Notable for the following components:   BUN 33 (*)    Creatinine, Ser 1.74 (*)    GFR, Estimated 30 (*)    All other components within normal limits  URINALYSIS, W/ REFLEX TO CULTURE (INFECTION SUSPECTED) - Abnormal; Notable for the following components:   Color, Urine YELLOW (*)    APPearance CLEAR (*)    Glucose, UA >=500 (*)    Bacteria, UA RARE (*)    All other components within normal limits     EKG  Not indicated.   RADIOLOGY  Image and radiology report reviewed and interpreted by me. Radiology report consistent with the same.  CT of the head  and cervical spine are both negative for acute concerns.  Chest x-ray is negative for acute cardiopulmonary abnormality.  Image of the right knee shows probable mildly displaced patella fracture.  PROCEDURES:  Critical Care performed: No  Procedures   MEDICATIONS ORDERED IN ED:  Medications - No data to display   IMPRESSION / MDM / ASSESSMENT AND PLAN / ED COURSE   I have reviewed the triage note and vital signs. Vital signs stable   Differential diagnosis includes, but is not limited to, knee strain, patellar fracture, electrolyte abnormality, acute cystitis, ICH, minor head injury, cervical vertebral injury  Patient's presentation is most consistent with acute presentation with potential threat to life or bodily function.  78 year old female presenting to the emergency department for treatment and evaluation after  she slid to the floor when getting out of bed.  She did not strike her head.  She is not having a headache or neck pain.  She is complaining of acute on chronic knee pain.  Right greater than left.  CT imaging of the head and cervical spine are both negative for acute concerns.  Chest x-ray is negative.  Image of the right knee shows a transverse,  displaced patella fracture.  Results discussed with the patient.  She states that Dr. Mardee is aware of the patella fracture and knee pain.  She reports that he is currently searching for a brace that will fit her legs.  Lab studies are all at or near baseline.  Her BUN and creatinine are slightly elevated with a GFR of 30.  Her baseline is in the upper 30s low 40s.  Urinalysis is without indication of acute cystitis.  Admission considered however labs are overall reassuring and imaging is negative for new findings.  Placing patient in a boarder status was offered in order to find placement for rehab however patient declines.  Daughter at bedside is agreeable to having her at home however is concerned that she may need to  return if she falls again.  Patient again declines rehab placement.  She will be discharged home with instructions to follow-up with both primary care as well as orthopedics.  ER return precautions discussed as well.      FINAL CLINICAL IMPRESSION(S) / ED DIAGNOSES   Final diagnoses:  Frequent falls  Closed displaced transverse fracture of right patella, initial encounter     Rx / DC Orders   ED Discharge Orders     None        Note:  This document was prepared using Dragon voice recognition software and may include unintentional dictation errors.   Herlinda Kirk NOVAK, FNP 08/16/24 2040    Suzanne Kirsch, MD 08/16/24 2158

## 2024-08-16 NOTE — Discharge Instructions (Signed)
 Please use your walker anytime you are standing/ambulating.  Call and schedule follow-up appointment with both your primary care provider as well as orthopedics.  Return to the emergency department for symptoms that change, worsen, or for new concerns.

## 2024-08-16 NOTE — Telephone Encounter (Signed)
 Copied from CRM [id number]. Topic: General - Other >> Aug 16, 2024  3:12 PM Zebedee SAUNDERS wrote: Reason for CRM: Pt's daughter Vandora, Jaskulski [phone] called stated pt has been admitted to hospital.

## 2024-08-17 ENCOUNTER — Other Ambulatory Visit: Payer: Self-pay

## 2024-08-17 ENCOUNTER — Emergency Department

## 2024-08-17 ENCOUNTER — Observation Stay
Admission: EM | Admit: 2024-08-17 | Discharge: 2024-08-24 | DRG: 314 | Disposition: A | Discharge status: Skilled Nursing Facility | Attending: Internal Medicine | Admitting: Internal Medicine

## 2024-08-17 ENCOUNTER — Observation Stay

## 2024-08-17 ENCOUNTER — Telehealth: Payer: Self-pay

## 2024-08-17 DIAGNOSIS — W19XXXD Unspecified fall, subsequent encounter: Secondary | ICD-10-CM

## 2024-08-17 DIAGNOSIS — G4733 Obstructive sleep apnea (adult) (pediatric): Secondary | ICD-10-CM | POA: Diagnosis present

## 2024-08-17 DIAGNOSIS — S0003XD Contusion of scalp, subsequent encounter: Secondary | ICD-10-CM

## 2024-08-17 DIAGNOSIS — I959 Hypotension, unspecified: Secondary | ICD-10-CM | POA: Diagnosis present

## 2024-08-17 DIAGNOSIS — N179 Acute kidney failure, unspecified: Principal | ICD-10-CM | POA: Diagnosis present

## 2024-08-17 DIAGNOSIS — I480 Paroxysmal atrial fibrillation: Secondary | ICD-10-CM | POA: Diagnosis present

## 2024-08-17 DIAGNOSIS — I10 Essential (primary) hypertension: Secondary | ICD-10-CM | POA: Diagnosis present

## 2024-08-17 DIAGNOSIS — R7989 Other specified abnormal findings of blood chemistry: Secondary | ICD-10-CM | POA: Diagnosis present

## 2024-08-17 DIAGNOSIS — E66813 Obesity, class 3: Secondary | ICD-10-CM

## 2024-08-17 DIAGNOSIS — D62 Acute posthemorrhagic anemia: Secondary | ICD-10-CM

## 2024-08-17 DIAGNOSIS — Z87898 Personal history of other specified conditions: Secondary | ICD-10-CM

## 2024-08-17 DIAGNOSIS — Z7901 Long term (current) use of anticoagulants: Secondary | ICD-10-CM

## 2024-08-17 DIAGNOSIS — G9341 Metabolic encephalopathy: Secondary | ICD-10-CM | POA: Diagnosis present

## 2024-08-17 DIAGNOSIS — G8929 Other chronic pain: Secondary | ICD-10-CM

## 2024-08-17 DIAGNOSIS — E878 Other disorders of electrolyte and fluid balance, not elsewhere classified: Secondary | ICD-10-CM

## 2024-08-17 DIAGNOSIS — E1140 Type 2 diabetes mellitus with diabetic neuropathy, unspecified: Secondary | ICD-10-CM

## 2024-08-17 LAB — COMPREHENSIVE METABOLIC PANEL WITH GFR
ALT: 27 U/L (ref 0–44)
AST: 52 U/L — ABNORMAL HIGH (ref 15–41)
Albumin: 3.3 g/dL — ABNORMAL LOW (ref 3.5–5.0)
Alkaline Phosphatase: 89 U/L (ref 38–126)
Anion gap: 9 (ref 5–15)
BUN: 39 mg/dL — ABNORMAL HIGH (ref 8–23)
CO2: 26 mmol/L (ref 22–32)
Calcium: 8.8 mg/dL — ABNORMAL LOW (ref 8.9–10.3)
Chloride: 106 mmol/L (ref 98–111)
Creatinine, Ser: 2.31 mg/dL — ABNORMAL HIGH (ref 0.44–1.00)
GFR, Estimated: 21 mL/min — ABNORMAL LOW (ref >60)
Glucose, Bld: 96 mg/dL (ref 70–99)
Potassium: 5.3 mmol/L — ABNORMAL HIGH (ref 3.5–5.1)
Sodium: 141 mmol/L (ref 135–145)
Total Bilirubin: 0.6 mg/dL (ref 0.0–1.2)
Total Protein: 6.7 g/dL (ref 6.5–8.1)

## 2024-08-17 LAB — URINALYSIS, ROUTINE W REFLEX MICROSCOPIC
Bilirubin Urine: NEGATIVE
Glucose, UA: 50 mg/dL — AB
Hgb urine dipstick: NEGATIVE
Ketones, ur: NEGATIVE mg/dL
Leukocytes,Ua: NEGATIVE
Nitrite: NEGATIVE
Protein, ur: NEGATIVE mg/dL
Specific Gravity, Urine: 1.027 (ref 1.005–1.030)
pH: 5 (ref 5.0–8.0)

## 2024-08-17 LAB — CBC WITH DIFFERENTIAL/PLATELET
Abs Immature Granulocytes: 0.04 K/uL (ref 0.00–0.07)
Basophils Absolute: 0 K/uL (ref 0.0–0.1)
Basophils Relative: 0 %
Eosinophils Absolute: 0.1 K/uL (ref 0.0–0.5)
Eosinophils Relative: 2 %
HCT: 31.4 % — ABNORMAL LOW (ref 36.0–46.0)
Hemoglobin: 9.8 g/dL — ABNORMAL LOW (ref 12.0–15.0)
Immature Granulocytes: 1 %
Lymphocytes Relative: 28 %
Lymphs Abs: 2 K/uL (ref 0.7–4.0)
MCH: 31.8 pg (ref 26.0–34.0)
MCHC: 31.2 g/dL (ref 30.0–36.0)
MCV: 101.9 fL — ABNORMAL HIGH (ref 80.0–100.0)
Monocytes Absolute: 0.7 K/uL (ref 0.1–1.0)
Monocytes Relative: 10 %
Neutro Abs: 4.2 K/uL (ref 1.7–7.7)
Neutrophils Relative %: 59 %
Platelets: 135 K/uL — ABNORMAL LOW (ref 150–400)
RBC: 3.08 MIL/uL — ABNORMAL LOW (ref 3.87–5.11)
RDW: 18.7 % — ABNORMAL HIGH (ref 11.5–15.5)
WBC: 7 K/uL (ref 4.0–10.5)
nRBC: 1.6 % — ABNORMAL HIGH (ref 0.0–0.2)

## 2024-08-17 LAB — RETICULOCYTES
Immature Retic Fract: 31 % — ABNORMAL HIGH (ref 2.3–15.9)
RBC.: 3.11 MIL/uL — ABNORMAL LOW (ref 3.87–5.11)
Retic Count, Absolute: 88.3 K/uL (ref 19.0–186.0)
Retic Ct Pct: 2.8 % (ref 0.4–3.1)

## 2024-08-17 LAB — BASIC METABOLIC PANEL WITH GFR
Anion gap: 8 (ref 5–15)
BUN: 39 mg/dL — ABNORMAL HIGH (ref 8–23)
CO2: 26 mmol/L (ref 22–32)
Calcium: 8.6 mg/dL — ABNORMAL LOW (ref 8.9–10.3)
Chloride: 106 mmol/L (ref 98–111)
Creatinine, Ser: 2.27 mg/dL — ABNORMAL HIGH (ref 0.44–1.00)
GFR, Estimated: 21 mL/min — ABNORMAL LOW (ref >60)
Glucose, Bld: 94 mg/dL (ref 70–99)
Potassium: 5.2 mmol/L — ABNORMAL HIGH (ref 3.5–5.1)
Sodium: 140 mmol/L (ref 135–145)

## 2024-08-17 LAB — IRON AND TIBC
Iron: 73 ug/dL (ref 28–170)
Saturation Ratios: 34 % — ABNORMAL HIGH (ref 10.4–31.8)
TIBC: 216 ug/dL — ABNORMAL LOW (ref 250–450)
UIBC: 143 ug/dL

## 2024-08-17 LAB — RESP PANEL BY RT-PCR (RSV, FLU A&B, COVID)  RVPGX2
Influenza A by PCR: NEGATIVE
Influenza B by PCR: NEGATIVE
Resp Syncytial Virus by PCR: NEGATIVE
SARS Coronavirus 2 by RT PCR: NEGATIVE

## 2024-08-17 LAB — TROPONIN T, HIGH SENSITIVITY
Troponin T High Sensitivity: 39 ng/L — ABNORMAL HIGH (ref 0–19)
Troponin T High Sensitivity: 38 ng/L — ABNORMAL HIGH (ref 0–19)

## 2024-08-17 LAB — FERRITIN: Ferritin: 357 ng/mL — ABNORMAL HIGH (ref 11–307)

## 2024-08-17 LAB — MAGNESIUM: Magnesium: 2.9 mg/dL — ABNORMAL HIGH (ref 1.7–2.4)

## 2024-08-17 LAB — FOLATE: Folate: 6.8 ng/mL (ref >5.9)

## 2024-08-17 MED ORDER — ACETAMINOPHEN 650 MG RE SUPP: 650.0000 mg | Freq: Four times a day (QID) | RECTAL | Status: DC | PRN

## 2024-08-17 MED ORDER — ESCITALOPRAM OXALATE 10 MG PO TABS
5.0000 mg | ORAL_TABLET | Freq: Every day | ORAL | Status: DC
  Administered 2024-08-18 – 2024-08-24 (×7): 5 mg via ORAL
  Filled 2024-08-17 (×7): qty 1

## 2024-08-17 MED ORDER — LEVOTHYROXINE SODIUM 50 MCG PO TABS
75.0000 ug | ORAL_TABLET | Freq: Every day | ORAL | Status: DC
  Administered 2024-08-18 – 2024-08-24 (×7): 75 ug via ORAL
  Filled 2024-08-17 (×7): qty 1

## 2024-08-17 MED ORDER — TRAZODONE HCL 50 MG PO TABS: 25.0000 mg | ORAL_TABLET | Freq: Every evening | ORAL | Status: DC | PRN

## 2024-08-17 MED ORDER — METHOCARBAMOL 1000 MG/10ML IJ SOLN: 500.0000 mg | Freq: Four times a day (QID) | INTRAMUSCULAR | Status: DC | PRN

## 2024-08-17 MED ORDER — AMIODARONE HCL 200 MG PO TABS
200.0000 mg | ORAL_TABLET | Freq: Every day | ORAL | Status: DC
  Administered 2024-08-18 – 2024-08-24 (×7): 200 mg via ORAL
  Filled 2024-08-17 (×7): qty 1

## 2024-08-17 MED ORDER — OXYCODONE HCL 5 MG PO TABS
5.0000 mg | ORAL_TABLET | ORAL | Status: DC | PRN
  Administered 2024-08-18: 5 mg via ORAL
  Filled 2024-08-17 (×2): qty 1

## 2024-08-17 MED ORDER — LACTATED RINGERS IV BOLUS
1000.0000 mL | Freq: Once | INTRAVENOUS | Status: AC
  Administered 2024-08-17: 1000 mL via INTRAVENOUS

## 2024-08-17 MED ORDER — ACETAMINOPHEN 325 MG PO TABS: 650.0000 mg | ORAL_TABLET | Freq: Four times a day (QID) | ORAL | Status: DC | PRN

## 2024-08-17 MED ORDER — AMOXICILLIN 500 MG PO CAPS
500.0000 mg | ORAL_CAPSULE | Freq: Two times a day (BID) | ORAL | Status: DC
  Administered 2024-08-18 – 2024-08-24 (×14): 500 mg via ORAL
  Filled 2024-08-17 (×14): qty 1

## 2024-08-17 MED ORDER — ONDANSETRON HCL 4 MG/2ML IJ SOLN: 4.0000 mg | Freq: Four times a day (QID) | INTRAMUSCULAR | Status: DC | PRN

## 2024-08-17 MED ORDER — SODIUM CHLORIDE 0.9 % IV SOLN: INTRAVENOUS | Status: DC

## 2024-08-17 MED ORDER — ONDANSETRON HCL 4 MG PO TABS: 4.0000 mg | ORAL_TABLET | Freq: Four times a day (QID) | ORAL | Status: DC | PRN

## 2024-08-17 MED ORDER — ATORVASTATIN CALCIUM 20 MG PO TABS
40.0000 mg | ORAL_TABLET | Freq: Every evening | ORAL | Status: DC
  Administered 2024-08-18 – 2024-08-23 (×6): 40 mg via ORAL
  Filled 2024-08-17 (×6): qty 2

## 2024-08-17 NOTE — Assessment & Plan Note (Addendum)
 Creatinine 1.74 on presentation.  Creatinine peaked at 2.36.  Today's creatinine 1.84.  Baseline creatinine around 1.3.  Try to get rid of IV fluids today and see what happens.

## 2024-08-17 NOTE — ED Triage Notes (Signed)
 Arrived by EMS from home.  Daughter called due to patient not really wanting to wake up and not acting like herself.  She keeps her eyes closed but can answer most of your questions.  Aox3 mixed up on some dates but then can correct  Treating for kidney failure.  Fall yesterday and treated here yesterday being discharged at 3am.  Pt has swollen red hot spot on the back of left calf and a knot on the back of her head.  EMS stated HYPOtension with 89/28 and then 89/49

## 2024-08-17 NOTE — Hospital Course (Addendum)
 paroxysmal atrial fibrillation, HTN, SVT, pulmonary HTN, CKD stage 3b, T2DM, chronic DVT/lymphedema on pump, wheelchair/walker dependent. OSA, morbid obesity, recurrent group B strep bacteremia( 2021-2023) on suppressive amoxicillin  bid, being admitted with acute metabolic encephalopathy/increased somnolence, hypotension and AKI.  She was brought in by family for the second time in 2 days for evaluation of somnolence and weakness resulting in recent falls.  The day prior workup was significant for AKI and a large scalp hematoma.  She was hydrated and family opted to take her home.  Patient remained difficult to arouse at home, thus prompting the return visit to the ED.  She did not fall again..  She has had no recent fever or chills, vomiting or diarrhea or other complaints. In the ED, BP as low as 84/47, improving to 114/70 with hydration then again downtrending to 91/51 by admission.  Heart rate was in the 60s and other vitals within normal limits. Labs notable for new anemia of 9.8, down from baseline of 12.8 the day prior,Creatinine of 2.31 above baseline of 1.3 and up from the day prior of 1.73.  Potassium 5.3 with magnesium  2.9.  Baseline thrombocytopenia of 135,000. Troponin 38 Urinalysis without infection, respiratory viral panel negative for COVID flu and RSV. EKG showed sinus of 62 Chest x-ray nonacute CT head from the day prior (12/2) showed a large midline vertex scalp hematoma with no acute intracranial findings CT C-spine from the day prior was nonacute Patient treated with 2 L LR. Admission requested.

## 2024-08-17 NOTE — Assessment & Plan Note (Signed)
 Sliding scale insulin  coverage, hold home Farxiga .  Last hemoglobin A1c low at 5.7.

## 2024-08-17 NOTE — H&P (Incomplete)
 History and Physical    Patient: Becky Trevino FMW:[id number] DOB: 20-Oct-1945 DOA: 08/17/2024 DOS: the patient was seen and examined on 08/17/2024 PCP: Glenard Mire, MD  Patient coming from: Home  Chief Complaint:  Chief Complaint  Patient presents with   Hypotension    HPI: Becky Trevino is a 78 y.o. female with medical history significant for paroxysmal atrial fibrillation, HTN, SVT, pulmonary HTN, CKD stage 3b, T2DM, chronic DVT/lymphedema on pump, wheelchair/walker dependent. 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  Patient, who is wheelchair/walker dependent presented for the second time in 2 days for evaluation of somnolence/weakness resulting in recent falls.  Workup on 12/2 showed a large scalp hematoma and AKI.[name]  She was hydrated and family opted to take her home.  Patient remained difficult to arouse at home, thus prompting the return visit to the ED.  She did not fall again..  She has had no recent fever or chills, vomiting or diarrhea or other complaints. In the ED, BP as low as 84/47, improving to 114/70 with hydration then again downtrending to 91/51 by admission.  Heart rate was in the 60s and other vitals within normal limits. Labs notable for new anemia of 9.8, down from baseline of 12.8 the day prior,Creatinine of 2.31 above baseline of 1.3 and up from the day prior of 1.73.  Potassium 5.3 with magnesium  2.9.  Baseline thrombocytopenia of 135,000. Troponin 38 Urinalysis without infection, respiratory viral panel negative for COVID flu and RSV. EKG showed sinus of 62 Chest x-ray nonacute CT head from the day prior (12/2) showed a large midline vertex scalp hematoma with no acute intracranial findings CT C-spine from the day prior was nonacute Patient treated with 2 L LR. Admission  requested.      Past Medical History:  Diagnosis Date   Cervical spondylosis    Chronic kidney disease    Stage 3   DDD (degenerative disc disease), cervical    [facility]   Diabetes mellitus without complication (HCC)    Diverticulosis    Hyperlipidemia    Hypertension    Intertrigo    Leg cramps    Leg varices    Obesity    OSA (obstructive sleep apnea)    Symptomatic menopausal or female climacteric states    Syncope    Past Surgical History:  Procedure Laterality Date   ABDOMINAL HYSTERECTOMY  1993   Total   BUNIONECTOMY Bilateral 1993   ESOPHAGOGASTRODUODENOSCOPY (EGD) WITH PROPOFOL  N/A 05/13/2023   Procedure: ESOPHAGOGASTRODUODENOSCOPY (EGD) WITH PROPOFOL ;  Surgeon: Therisa Bi, MD;  Location: [facility] ENDOSCOPY;  Service: Gastroenterology;  Laterality: N/A;   I & D KNEE WITH POLY EXCHANGE Right 11/19/2017   Procedure: RIGHT KNEE POLY EXCHANGE WITH IRRIGATION AND DEBRIDEMENT;  Surgeon: Kathlynn Sharper, MD;  Location: [facility] ORS;  Service: Orthopedics;  Laterality: Right;   IR FLUORO GUIDED NEEDLE PLC ASPIRATION/INJECTION LOC  09/19/2021   JOINT REPLACEMENT     bilateral knee   LAMINECTOMY  11/14/2013    Cervical Fusion , [facility], Dr. Oma   LASER ABLATION Bilateral 07/29/2012   Dr. Marea   LOWER EXTREMITY ANGIOGRAPHY Right 11/02/2019   Procedure: Lower Extremity Angiography;  Surgeon: Jama Cordella MATSU, MD;  Location: [facility] INVASIVE CV LAB;  Service: Cardiovascular;  Laterality: Right;   ORIF ANKLE FRACTURE Right 09/02/2019   Procedure: OPEN REDUCTION INTERNAL FIXATION (ORIF) ANKLE FRACTURE  BIMALLEOLAR;  Surgeon: Lennie Barter, DPM;  Location: [facility] ORS;  Service: Podiatry;  Laterality: Right;   PERIPHERAL VASCULAR THROMBECTOMY Right 03/02/2020   Procedure: PERIPHERAL VASCULAR THROMBECTOMY;  Surgeon: Marea Selinda RAMAN, MD;  Location: [facility] INVASIVE CV LAB;  Service: Cardiovascular;  Laterality: Right;   REPLACEMENT TOTAL KNEE Left 03/2010   Dr. Kathlynn   TEE WITHOUT CARDIOVERSION N/A  09/19/2021   Procedure: TRANSESOPHAGEAL ECHOCARDIOGRAM (TEE);  Surgeon: Hester Wolm PARAS, MD;  Location: [facility] ORS;  Service: Cardiovascular;  Laterality: N/A;   TOTAL HIP ARTHROPLASTY Left 12/11/2015   Procedure: TOTAL HIP ARTHROPLASTY ANTERIOR APPROACH;  Surgeon: Ozell Kathlynn, MD;  Location: [facility] ORS;  Service: Orthopedics;  Laterality: Left;   TOTAL KNEE ARTHROPLASTY Right 03/11/2016   Procedure: TOTAL KNEE ARTHROPLASTY;  Surgeon: Ozell Kathlynn, MD;  Location: [facility] ORS;  Service: Orthopedics;  Laterality: Right;   Social History:  reports that she has never smoked. She has never been exposed to tobacco smoke. She has never used smokeless tobacco. She reports that she does not drink alcohol and does not use drugs.  Allergies  Allergen Reactions   Lisinopril Swelling    Family History  Problem Relation Age of Onset   Diabetes Mother    Hyperlipidemia Mother    Hypertension Mother    Diabetes Father    Hyperlipidemia Father    Hypertension Father    Obesity Father    Hypertension Sister    Hyperlipidemia Sister    Hyperlipidemia Sister    Breast cancer Neg Hx     Prior to Admission medications   Medication Sig Start Date End Date Taking? Authorizing Provider  ACCU-CHEK GUIDE test strip TEST ONCE A DAY AS DIRECTED 07/28/22   Sowles, Krichna, MD  acetaminophen  (TYLENOL ) 650 MG CR tablet Take 650-1,300 mg by mouth 2 (two) times daily as needed for pain.    [provider]  amiodarone  (PACERONE ) 200 MG tablet Take 200 mg by mouth daily. 07/21/21   Callwood, Dwayne D, MD  amoxicillin  (AMOXIL ) 500 MG capsule Take 1 capsule (500 mg total) by mouth 2 (two) times daily. 06/23/24   Fayette Bodily, MD  apixaban  (ELIQUIS ) 5 MG TABS tablet Take 1 tablet by mouth 2 (two) times daily. 07/31/21   Callwood, Dwayne D, MD  atorvastatin  (LIPITOR) 40 MG tablet TAKE 1 TABLET BY MOUTH EVERY DAY IN THE EVENING 03/08/24   Sowles, Krichna, MD  blood glucose meter kit and supplies 1 each by Other  route daily at 12 noon. Dispense based on patient and insurance preference. Check fsbs once a day 10/07/21   Sowles, Krichna, MD  Calcium  Carbonate-Vitamin D  600-400 MG-UNIT tablet Take 1 tablet by mouth 2 (two) times daily. 11/14/20   Sowles, Krichna, MD  dapagliflozin  propanediol (FARXIGA ) 10 MG TABS tablet Take 1 tablet (10 mg total) by mouth daily before breakfast. Patient receives via [employer] Patient Assistance 03/16/24   Sowles, Krichna, MD  escitalopram  (LEXAPRO ) 5 MG tablet TAKE 1 TABLET (5 MG TOTAL) BY MOUTH DAILY. 03/08/24   Sowles, Krichna, MD  ferrous sulfate  325 (65 FE) MG EC tablet Take 1 tablet (325 mg total) by mouth daily. 07/05/24   Babara Call, MD  fluticasone  (FLONASE ) 50 MCG/ACT nasal spray SPRAY 2 SPRAYS INTO EACH NOSTRIL EVERY DAY 03/03/23   Sowles, Krichna, MD  hydroquinone 4 % cream Apply topically at bedtime as needed. 06/10/22   [provider]  ketoconazole  (NIZORAL ) 2 % cream Apply 1 Application topically as needed for irritation. 01/26/15   [provider]  latanoprost  (XALATAN ) 0.005 % ophthalmic solution Place 1 drop into both eyes at bedtime. 07/12/20   [provider]  levothyroxine  (SYNTHROID ) 75 MCG tablet Take 1 tablet (75 mcg total) by mouth daily. 07/14/24   Sowles, Krichna, MD  Magnesium  Oxide 500 MG CAPS Take 500 mg by mouth daily.  11/10/07   [provider]  metoprolol  succinate (TOPROL -XL) 50 MG 24 hr tablet Take 50 mg by mouth daily. 09/04/21   Callwood, Dwayne D, MD  omeprazole  (PRILOSEC) 40 MG capsule TAKE 1 CAPSULE (40 MG TOTAL) BY MOUTH DAILY. 07/08/24 07/08/25  [place], PA-C  oxybutynin  (DITROPAN  XL) 15 MG 24 hr tablet Take 1 tablet (15 mg total) by mouth daily. 07/28/24   Helon Kirsch A, PA-C  oxyCODONE -acetaminophen  (PERCOCET/ROXICET) 5-325 MG tablet Take 1 tablet by mouth every 4 (four) hours as needed. 07/02/23   [provider]  polyethylene glycol (MIRALAX  / GLYCOLAX ) 17 g packet Take 17 g by mouth  daily. Patient taking differently: Take 17 g by mouth every other day. 09/15/20   Christobal Guadalajara, MD  potassium chloride  (KLOR-CON ) 10 MEQ tablet Take 10 mEq by mouth daily. 08/18/19   Callwood, Dwayne D, MD  pregabalin  (LYRICA ) 50 MG capsule Take 1 capsule (50 mg total) by mouth every evening. 07/04/24   Sowles, Krichna, MD  SIMBRINZA 1-0.2 % SUSP Apply 1 drop to eye 2 (two) times daily. 03/13/23   [provider]  sodium chloride  (OCEAN) 0.65 % SOLN nasal spray Place 1 spray into both nostrils 3 (three) times daily. 07/20/21   Tobie Calix, MD  torsemide  (DEMADEX ) 20 MG tablet TAKE 1 TABLET BY MOUTH EVERY DAY 03/08/24   Glenard Mire, MD    Physical Exam: Vitals:   08/17/24 2030 08/17/24 2040 08/17/24 2110 08/17/24 2217  BP: 93/71 (!) 94/57 (!) 91/59 (!) 89/49  Pulse: 61 61 60 61  Resp: 15 10 12 13   Temp:    (!) 97.4 F (36.3 C)  TempSrc:    Oral  SpO2: 98% 97% 97% 98%  Weight:      Height:       Physical Exam  Labs on Admission: I have personally reviewed following labs and imaging studies  CBC: Recent Labs  Lab 08/16/24 1746 08/17/24 1647  WBC 5.7 7.0  NEUTROABS  --  4.2  HGB 12.3 9.8*  HCT 39.0 31.4*  MCV 101.8* 101.9*  PLT 142* 135*   Basic Metabolic Panel: Recent Labs  Lab 08/16/24 1746 08/17/24 1647 08/17/24 1915  NA 142 141 140  K 4.7 5.3* 5.2*  CL 107 106 106  CO2 25 26 26   GLUCOSE 93 96 94  BUN 33* 39* 39*  CREATININE 1.74* 2.31* 2.27*  CALCIUM  9.0 8.8* 8.6*  MG  --  2.9*  --    GFR: Estimated Creatinine Clearance: 30.8 mL/min (A) (by C-G formula based on SCr of 2.27 mg/dL (H)). Liver Function Tests: Recent Labs  Lab 08/17/24 1647  AST 52*  ALT 27  ALKPHOS 89  BILITOT 0.6  PROT 6.7  ALBUMIN 3.3*   No results for input(s): LIPASE, AMYLASE in the last 168 hours. No results for input(s): AMMONIA in the last 168 hours. Coagulation Profile: No results for input(s): INR, PROTIME in the last 168 hours. Cardiac Enzymes: No results  for input(s): CKTOTAL, CKMB, CKMBINDEX, TROPONINI in the last 168 hours. BNP (last 3 results) No results for input(s): PROBNP in the last 8760 hours. HbA1C: No results for input(s): HGBA1C in the  last 72 hours. CBG: No results for input(s): GLUCAP in the last 168 hours. Lipid Profile: No results for input(s): CHOL, HDL, LDLCALC, TRIG, CHOLHDL, LDLDIRECT in the last 72 hours. Thyroid  Function Tests: No results for input(s): TSH, T4TOTAL, FREET4, T3FREE, THYROIDAB in the last 72 hours. Anemia Panel: Recent Labs    08/17/24 1647 08/17/24 1913  FERRITIN  --  357*  TIBC  --  216*  IRON   --  73  RETICCTPCT 2.8  --    Urine analysis:    Component Value Date/Time   COLORURINE AMBER (A) 08/17/2024 2010   APPEARANCEUR CLOUDY (A) 08/17/2024 2010   APPEARANCEUR Clear 07/28/2024 1050   LABSPEC 1.027 08/17/2024 2010   PHURINE 5.0 08/17/2024 2010   GLUCOSEU 50 (A) 08/17/2024 2010   HGBUR NEGATIVE 08/17/2024 2010   BILIRUBINUR NEGATIVE 08/17/2024 2010   BILIRUBINUR Negative 07/28/2024 1050   KETONESUR NEGATIVE 08/17/2024 2010   PROTEINUR NEGATIVE 08/17/2024 2010   NITRITE NEGATIVE 08/17/2024 2010   LEUKOCYTESUR NEGATIVE 08/17/2024 2010    Radiological Exams on Admission: DG Chest Portable 1 View Result Date: 08/17/2024 EXAM: 1 VIEW(S) XRAY OF THE CHEST 08/17/2024 05:09:29 PM COMPARISON: Yesterday. CLINICAL HISTORY: falls, hypotension FINDINGS: LUNGS AND PLEURA: Hypoinflation of the lungs is noted. No focal pulmonary opacity. No pleural effusion. No pneumothorax. HEART AND MEDIASTINUM: Stable cardiomediastinal silhouette. BONES AND SOFT TISSUES: Stable chronic findings seen involving both shoulders and proximal humeri. No acute osseous abnormality. IMPRESSION: 1. No acute cardiopulmonary process. Electronically signed by: Lynwood Seip MD 08/17/2024 05:53 PM EST RP Workstation: [id number]   CT Head Wo Contrast Result Date: 08/16/2024 CLINICAL DATA:  Head  trauma, minor (Age >= 65y) EXAM: CT HEAD WITHOUT CONTRAST TECHNIQUE: Contiguous axial images were obtained from the base of the skull through the vertex without intravenous contrast. RADIATION DOSE REDUCTION: This exam was performed according to the departmental dose-optimization program which includes automated exposure control, adjustment of the mA and/or kV according to patient size and/or use of iterative reconstruction technique. COMPARISON:  10/27/19 FINDINGS: Brain: Posterior fossa is partially obscured by craniocervical fusion hardware. No intracranial hemorrhage, mass effect, or midline shift. No hydrocephalus. The basilar cisterns are patent. No evidence of territorial infarct or acute ischemia. No extra-axial or intracranial fluid collection. Vascular: No hyperdense vessel or unexpected calcification. Skull: Negative for fracture or focal lesion. Sinuses/Orbits: No acute finding. Other: Large midline vertex scalp hematoma. IMPRESSION: Large midline vertex scalp hematoma. No acute intracranial abnormality. No skull fracture. Electronically Signed   By: Andrea Gasman M.D.   On: 08/16/2024 17:53   CT Cervical Spine Wo Contrast Result Date: 08/16/2024 CLINICAL DATA:  Neck trauma (Age >= 65y) Slid out of bed onto floor. EXAM: CT CERVICAL SPINE WITHOUT CONTRAST TECHNIQUE: Multidetector CT imaging of the cervical spine was performed without intravenous contrast. Multiplanar CT image reconstructions were also generated. RADIATION DOSE REDUCTION: This exam was performed according to the departmental dose-optimization program which includes automated exposure control, adjustment of the mA and/or kV according to patient size and/or use of iterative reconstruction technique. COMPARISON:  Cervical spine MRI 08/01/2024 FINDINGS: Alignment: Straightening of normal lordosis. Chronic dens fracture with anterior positioning of to C1 relative to C2, unchanged from prior MRI. No acute subluxation. Skull base and  vertebrae: Posterior fusion from the occiput through C5. Lucency through the right C2 screw measures up to 2 mm. Lucency adjacent to the right C3 screw measures up to 2 mm. The hardware is intact. Chronic dens fracture with anterior positioning of C1 relative  to C2, unchanged from prior MRI. No acute fracture. Soft tissues and spinal canal: No prevertebral soft tissue thickening. No evidence of canal hematoma. Disc levels: Diffuse disc space narrowing and irregularity, unchanged. Upper chest: No acute findings. Other: Chronic shoulder arthropathy is partially included. IMPRESSION: 1. No acute fracture or subluxation of the cervical spine. 2. Posterior fusion from the occiput through C5. Lucency adjacent to the right C2 and C3 screws, suspicious for loosening. 3. Chronic dens fracture with anterior positioning of C1 relative to C2, unchanged from prior MRI. Electronically Signed   By: Andrea Gasman M.D.   On: 08/16/2024 17:48   DG Chest 1 View Result Date: 08/16/2024 EXAM: 1 VIEW(S) XRAY OF THE CHEST 08/16/2024 04:38:20 PM COMPARISON: None available. CLINICAL HISTORY: wheezing for the past week FINDINGS: LUNGS AND PLEURA: Low lung volumes without acute airspace opacification. No pleural effusion. No pneumothorax. HEART AND MEDIASTINUM: Aortic atherosclerosis. No acute abnormality of the cardiac and mediastinal silhouettes. BONES AND SOFT TISSUES: Cervical fixation hardware partially visualized. Chronic stable lytic process both shoulder girdles. Chronic left shoulder dislocation. No acute osseous abnormality. IMPRESSION: 1. Hypoinflation without acute cardiopulmonary disease. 2. Chronic lytic process bilateral shoulder girdles unchanged. Electronically signed by: Toribio Agreste MD 08/16/2024 05:02 PM EST RP Workstation: [id number]   DG Knee 1-2 Views Right Result Date: 08/16/2024 EXAM: 1 or 2 VIEW(S) XRAY OF THE RIGHT KNEE 08/16/2024 03:56:32 PM COMPARISON: Comparison 03/11/2016. Status post right total knee  arthroplasty. CLINICAL HISTORY: Fall. FINDINGS: BONES AND JOINTS: Probable mildly displaced patellar fracture is noted. Status post right total knee arthroplasty. No focal osseous lesion. No joint dislocation. No significant joint effusion. SOFT TISSUES: The soft tissues are unremarkable. IMPRESSION: 1. Status post right total knee arthroplasty. 2. Probable mildly displaced patellar fracture. Electronically signed by: Lynwood Seip MD 08/16/2024 04:05 PM EST RP Workstation: [id number]   Data Reviewed for HPI: Relevant notes from primary care and specialist visits, past discharge summaries as available in EHR, including Care Everywhere. Prior diagnostic testing as pertinent to current admission diagnoses Updated medications and problem lists for reconciliation ED course, including vitals, labs, imaging, treatment and response to treatment Triage notes, nursing and pharmacy notes and ED provider's notes Notable results as noted above in HPI      Assessment and Plan: * Acute metabolic encephalopathy Hypotension Uncertain etiology: Dehydration, concern for acute blood loss.  No signs or stigmata of acute infection Systolic BP in the high 80s to low 90s Will get stat head CT to evaluate for progression of scalp hematoma from 12/2 Neurologic checks with fall and aspiration precautions Serial H&H Checking TSH Hold antihypertensives IV hydration to keep MAP over 65  Falls, subsequent encounter Wheelchair/walker dependent Chronic lymphedema on lymphedema pumps Class III obesity Complicating factors overall prognosis and care Keep legs elevated Continue compression stockings Fall precautions PT OT eval  ABLA (acute blood loss anemia) versus hemodilutional anemia Large scalp hematoma on head CT 08/16/2024 Chronic anticoagulation Hemoglobin 9.8, down from baseline of 12.8 on 12/2 Hold anticoagulation Getting CT head to evaluate for progression of hematoma or development of acute  intracranial bleed or other abnormality Serial H&H and transfuse if needed Anemia panel ordered as well  Elevated troponin Mild troponin elevation of 38, no reports of chest pain and EKG nonacute Suspect supply/demand mismatch related to hypotension No history of CAD Follows with cardiology, last seen November 2025  History of recurrent group B strep bacteremia Followed by ID, last seen 06/2024 Continue suppressive amoxicillin  twice daily Acute infection not  suspected at this time  Electrolyte disturbance Hyperkalemia and hypermagnesemia Possibly related to dehydration and AKI on CKD Potassium 5.3 and magnesium  2.9 Continue IV fluid hydration Monitor electrolytes  Acute renal failure superimposed on stage 3b chronic kidney disease (HCC) Creatinine uptrending at 2.31, up from 1.73 the day prior (baseline 1.3) in spite of hydration Likely ATN related to renal hypoperfusion from hypotension-dehydration versus acute blood loss Getting head CT and CT abdomen and pelvis to evaluate for trauma with blood loss and progression of scalp hematoma given ongoing anticoagulation  Type 2 diabetes mellitus with diabetic neuropathy, without long-term current use of insulin  (HCC) Sliding scale insulin  coverage, hold home Farxiga   AF (paroxysmal atrial fibrillation) (HCC) Holding apixaban  pending further evaluation with head CT Continue amiodarone  Will hold metoprolol  until blood pressure improves  Obesity, Class III, BMI 40-49.9 (morbid obesity) (HCC) OSA CPAP if desired Complicating factor to overall prognosis and care    DVT prophylaxis: SCD pending resumption of apixaban  after ruling out occult bleed  Consults: none  Advance Care Planning:   Code Status: Prior   Family Communication: none  Disposition Plan: Back to previous home environment  Severity of Illness: The appropriate patient status for this patient is OBSERVATION. Observation status is judged to be reasonable and  necessary in order to provide the required intensity of service to ensure the patient's safety. The patient's presenting symptoms, physical exam findings, and initial radiographic and laboratory data in the context of their medical condition is felt to place them at decreased risk for further clinical deterioration. Furthermore, it is anticipated that the patient will be medically stable for discharge from the hospital within 2 midnights of admission.   Author: Delayne LULLA Solian, MD 08/17/2024 11:29 PM  For on call review [email or web address].

## 2024-08-17 NOTE — Assessment & Plan Note (Addendum)
 Improved.  Likely secondary to low blood pressure and acute kidney injury.

## 2024-08-17 NOTE — Assessment & Plan Note (Addendum)
 Hyperkalemia and hypermagnesemia Potassium improved. Hypernatremia resolved

## 2024-08-17 NOTE — Assessment & Plan Note (Addendum)
 Large scalp hematoma on head CT 08/16/2024 Chronic anticoagulation Hemoglobin down to 7.8 on 12/5 and given 1 unit of packed red blood cells.  Today's hemoglobin 8.7.  Will restart Eliquis  this evening and monitor hemoglobin.

## 2024-08-17 NOTE — ED Notes (Signed)
 Assisted primary RN with in and out cath. Tolerated well. Urine collected. Peri care provided. Purwick replaced. Pt has call bell with in reach. Family at bedside

## 2024-08-17 NOTE — Assessment & Plan Note (Addendum)
 Followed by ID, last seen 06/2024 Continue suppressive amoxicillin  twice daily Acute infection not suspected at this time

## 2024-08-17 NOTE — ED Notes (Signed)
 Pt transported home by [facility], per pt, pts daughter is awaiting her at home.

## 2024-08-17 NOTE — Telephone Encounter (Signed)
 Copied from CRM [id number]. Topic: Clinical - Medical Advice >> Aug 17, 2024  4:36 PM Nathanel BROCKS wrote: Reason for CRM: [name], daughter. Pt fell yesterday and went to ER but today she is confused and disoriented and is going back. Daughter just wanted you to know.

## 2024-08-17 NOTE — Assessment & Plan Note (Deleted)
 Chronic anticoagulation Given increasing somnolence, will get CT head to evaluate for progression of hematoma or development of acute intracranial bleed or other abnormality

## 2024-08-17 NOTE — Assessment & Plan Note (Addendum)
 Paroxysmal in nature. Continue amiodarone  Will hold metoprolol . Restarted Eliquis  on 12/7.

## 2024-08-17 NOTE — ED Provider Notes (Signed)
 [facility] Provider Note    Event Date/Time   First MD Initiated Contact with Patient 08/17/24 1640     (approximate)   History   Hypotension   HPI  Becky Trevino is a 78 y.o. female who presents to the ED for evaluation of Hypotension   I reviewed ED visit from yesterday evening for patient was seen for frequent falls and weakness.  CT imaging of head and neck, plain film of chest and right knee, blood work with slight worsening of known CKD, UA without infectious features.  Baseline history of CKD, HTN, DM, A-fib on Eliquis .  Patient presents to the ED from home by EMS with daughter for evaluation of somnolence.  EMS found her hypotensive, 89/30.  No additional falls or injuries since she was evaluated here and discharged.   Physical Exam   Triage Vital Signs: ED Triage Vitals  Encounter Vitals Group     BP      Girls Systolic BP Percentile      Girls Diastolic BP Percentile      Boys Systolic BP Percentile      Boys Diastolic BP Percentile      Pulse      Resp      Temp      Temp src      SpO2      Weight      Height      Head Circumference      Peak Flow      Pain Score      Pain Loc      Pain Education      Exclude from Growth Chart     Most recent vital signs: Vitals:   08/17/24 2040 08/17/24 2110  BP: (!) 94/57 (!) 91/59  Pulse: 61 60  Resp: 10 12  Temp:    SpO2: 97% 97%    General: Awake, no distress.  Sleepy but arouses to vocal stimulation, seems to be nodding off but trying to pay attention.  Follows basic commands CV:  Good peripheral perfusion.  Resp:  Normal effort.  Abd:  No distention.  MSK:   Bruising to posterior left calf with surrounding erythema streaking anteriorly, warm to the touch, concerning for cellulitis. Neuro:  No focal deficits appreciated. Other:     ED Results / Procedures / Treatments   Labs (all labs ordered are listed, but only abnormal results are displayed) Labs Reviewed   COMPREHENSIVE METABOLIC PANEL WITH GFR - Abnormal; Notable for the following components:      Result Value   Potassium 5.3 (*)    BUN 39 (*)    Creatinine, Ser 2.31 (*)    Calcium  8.8 (*)    Albumin 3.3 (*)    AST 52 (*)    GFR, Estimated 21 (*)    All other components within normal limits  CBC WITH DIFFERENTIAL/PLATELET - Abnormal; Notable for the following components:   RBC 3.08 (*)    Hemoglobin 9.8 (*)    HCT 31.4 (*)    MCV 101.9 (*)    RDW 18.7 (*)    Platelets 135 (*)    nRBC 1.6 (*)    All other components within normal limits  MAGNESIUM  - Abnormal; Notable for the following components:   Magnesium  2.9 (*)    All other components within normal limits  URINALYSIS, ROUTINE W REFLEX MICROSCOPIC - Abnormal; Notable for the following components:   Color, Urine AMBER (*)  APPearance CLOUDY (*)    Glucose, UA 50 (*)    All other components within normal limits  BASIC METABOLIC PANEL WITH GFR - Abnormal; Notable for the following components:   Potassium 5.2 (*)    BUN 39 (*)    Creatinine, Ser 2.27 (*)    Calcium  8.6 (*)    GFR, Estimated 21 (*)    All other components within normal limits  TROPONIN T, HIGH SENSITIVITY - Abnormal; Notable for the following components:   Troponin T High Sensitivity 39 (*)    All other components within normal limits  TROPONIN T, HIGH SENSITIVITY - Abnormal; Notable for the following components:   Troponin T High Sensitivity 38 (*)    All other components within normal limits  RESP PANEL BY RT-PCR (RSV, FLU A&B, COVID)  RVPGX2  VITAMIN B12  FOLATE  IRON  AND TIBC  FERRITIN  RETICULOCYTES    EKG Sinus rhythm with a rate of 62 bpm.  Normal axis, right bundle, no STEMI  RADIOLOGY CXR interpreted by me without evidence of acute cardiopulmonary pathology.  Official radiology report(s): DG Chest Portable 1 View Result Date: 08/17/2024 EXAM: 1 VIEW(S) XRAY OF THE CHEST 08/17/2024 05:09:29 PM COMPARISON: Yesterday. CLINICAL HISTORY:  falls, hypotension FINDINGS: LUNGS AND PLEURA: Hypoinflation of the lungs is noted. No focal pulmonary opacity. No pleural effusion. No pneumothorax. HEART AND MEDIASTINUM: Stable cardiomediastinal silhouette. BONES AND SOFT TISSUES: Stable chronic findings seen involving both shoulders and proximal humeri. No acute osseous abnormality. IMPRESSION: 1. No acute cardiopulmonary process. Electronically signed by: Lynwood Seip MD 08/17/2024 05:53 PM EST RP Workstation: [id number]    PROCEDURES and INTERVENTIONS:  .Critical Care  Performed by: Claudene Rover, MD Authorized by: Claudene Rover, MD   Critical care provider statement:    Critical care time (minutes):  30   Critical care time was exclusive of:  Separately billable procedures and treating other patients   Critical care was necessary to treat or prevent imminent or life-threatening deterioration of the following conditions:  Circulatory failure   Critical care was time spent personally by me on the following activities:  Development of treatment plan with patient or surrogate, discussions with consultants, evaluation of patient's response to treatment, examination of patient, ordering and review of laboratory studies, ordering and review of radiographic studies, ordering and performing treatments and interventions, pulse oximetry, re-evaluation of patient's condition and review of old charts .1-3 Lead EKG Interpretation  Performed by: Claudene Rover, MD Authorized by: Claudene Rover, MD     Interpretation: normal     ECG rate:  60   ECG rate assessment: normal     Rhythm: sinus rhythm     Ectopy: none     Conduction: normal     Medications  lactated ringers  bolus 1,000 mL (0 mLs Intravenous Stopped 08/17/24 1919)  lactated ringers  bolus 1,000 mL (0 mLs Intravenous Stopped 08/17/24 2104)     IMPRESSION / MDM / ASSESSMENT AND PLAN / ED COURSE  I reviewed the triage vital signs and the nursing notes.  Differential diagnosis includes, but is  not limited to, symptomatic anemia, GI bleeding, dehydration, AKI, sepsis  {Patient presents with symptoms of an acute illness or injury that is potentially life-threatening.  Patient presents to the ED for the second time in 24 hours.  Seen for recurrent falls and weakness last night, today due to somnolence without additional falls or acute events.  Certainly a globally depressed mental status on my initial evaluation, improving on subsequent evaluations, nonfocal  exam. No indication for repeat imaging regarding trauma  Blood work shows worsening AKI from baseline around 1.3-1.4.  Hemoglobin is dropped 2 points but no symptoms of bleeding, she is on Eliquis .  Urine without infectious features, negative viral swabs.  Waxing and waning hypotension responsive to IV fluids without indications for vasopressors.   Clinical Course as of 08/17/24 2119  Wed Aug 17, 2024  1853 Reassessed.  Daughter remains at the bedside, niece now also at bedside.  Patient reports feeling well and she looks much better than her initial presentation.  She is awake, alert and making jokes.  Family knowledges that she seems back to baseline now.  We discussed worsening renal function and plan of care.  They would like to repeat a metabolic panel and assess kidney function after the IV fluids she is already received considering her improvement to status.  If this all remains improved they are interested in possibly going home. [DS]  2041 Reassessed.  Has had waxing and waning low blood pressures, some with maps under 65 but responsive to IV fluids.  Urine without infectious features.  Metabolic panel with residual AKI.  I recommend admission and they are agreeable. [DS]  2115 I consult with medicine who agrees to admit [DS]    Clinical Course User Index [DS] Claudene Rover, MD     FINAL CLINICAL IMPRESSION(S) / ED DIAGNOSES   Final diagnoses:  AKI (acute kidney injury)  Acute metabolic encephalopathy     Rx / DC  Orders   ED Discharge Orders     None        Note:  This document was prepared using Dragon voice recognition software and may include unintentional dictation errors.   Claudene Rover, MD 08/17/24 2121

## 2024-08-17 NOTE — Assessment & Plan Note (Addendum)
 OSA BMI 43.29.

## 2024-08-17 NOTE — Assessment & Plan Note (Addendum)
 Wheelchair/walker dependent Chronic lymphedema on lymphedema pumps PT/OT recommending rehab.  Patient changed her mind and would like to go out to rehab.

## 2024-08-17 NOTE — Assessment & Plan Note (Signed)
 Mild troponin elevation of 38, no reports of chest pain and EKG nonacute Suspect supply/demand mismatch related to hypotension No history of CAD Follows with cardiology, last seen November 2025

## 2024-08-18 DIAGNOSIS — I480 Paroxysmal atrial fibrillation: Secondary | ICD-10-CM

## 2024-08-18 DIAGNOSIS — D62 Acute posthemorrhagic anemia: Secondary | ICD-10-CM

## 2024-08-18 DIAGNOSIS — E66813 Obesity, class 3: Secondary | ICD-10-CM

## 2024-08-18 DIAGNOSIS — S0003XD Contusion of scalp, subsequent encounter: Secondary | ICD-10-CM

## 2024-08-18 DIAGNOSIS — N179 Acute kidney failure, unspecified: Secondary | ICD-10-CM

## 2024-08-18 DIAGNOSIS — E878 Other disorders of electrolyte and fluid balance, not elsewhere classified: Secondary | ICD-10-CM | POA: Diagnosis not present

## 2024-08-18 DIAGNOSIS — Z87898 Personal history of other specified conditions: Secondary | ICD-10-CM

## 2024-08-18 DIAGNOSIS — N1832 Chronic kidney disease, stage 3b: Secondary | ICD-10-CM

## 2024-08-18 DIAGNOSIS — I9589 Other hypotension: Secondary | ICD-10-CM

## 2024-08-18 DIAGNOSIS — W19XXXD Unspecified fall, subsequent encounter: Secondary | ICD-10-CM

## 2024-08-18 DIAGNOSIS — E1140 Type 2 diabetes mellitus with diabetic neuropathy, unspecified: Secondary | ICD-10-CM

## 2024-08-18 LAB — CBC
HCT: 29.5 % — ABNORMAL LOW (ref 36.0–46.0)
HCT: 27.6 % — ABNORMAL LOW (ref 36.0–46.0)
Hemoglobin: 9.2 g/dL — ABNORMAL LOW (ref 12.0–15.0)
Hemoglobin: 8.7 g/dL — ABNORMAL LOW (ref 12.0–15.0)
MCH: 32.1 pg (ref 26.0–34.0)
MCH: 32.2 pg (ref 26.0–34.0)
MCHC: 31.2 g/dL (ref 30.0–36.0)
MCHC: 31.5 g/dL (ref 30.0–36.0)
MCV: 102.8 fL — ABNORMAL HIGH (ref 80.0–100.0)
MCV: 102.2 fL — ABNORMAL HIGH (ref 80.0–100.0)
Platelets: 124 K/uL — ABNORMAL LOW (ref 150–400)
Platelets: 117 K/uL — ABNORMAL LOW (ref 150–400)
RBC: 2.87 MIL/uL — ABNORMAL LOW (ref 3.87–5.11)
RBC: 2.7 MIL/uL — ABNORMAL LOW (ref 3.87–5.11)
RDW: 18.6 % — ABNORMAL HIGH (ref 11.5–15.5)
RDW: 18.7 % — ABNORMAL HIGH (ref 11.5–15.5)
WBC: 7.3 K/uL (ref 4.0–10.5)
WBC: 7.1 K/uL (ref 4.0–10.5)
nRBC: 1.5 % — ABNORMAL HIGH (ref 0.0–0.2)
nRBC: 2 % — ABNORMAL HIGH (ref 0.0–0.2)

## 2024-08-18 LAB — BASIC METABOLIC PANEL WITH GFR
Anion gap: 9 (ref 5–15)
BUN: 41 mg/dL — ABNORMAL HIGH (ref 8–23)
CO2: 25 mmol/L (ref 22–32)
Calcium: 8.4 mg/dL — ABNORMAL LOW (ref 8.9–10.3)
Chloride: 107 mmol/L (ref 98–111)
Creatinine, Ser: 2.36 mg/dL — ABNORMAL HIGH (ref 0.44–1.00)
GFR, Estimated: 20 mL/min — ABNORMAL LOW (ref >60)
Glucose, Bld: 92 mg/dL (ref 70–99)
Potassium: 5.3 mmol/L — ABNORMAL HIGH (ref 3.5–5.1)
Sodium: 140 mmol/L (ref 135–145)

## 2024-08-18 LAB — HEMOGLOBIN: Hemoglobin: 8.3 g/dL — ABNORMAL LOW (ref 12.0–15.0)

## 2024-08-18 LAB — TSH: TSH: 8.26 u[IU]/mL — ABNORMAL HIGH (ref 0.350–4.500)

## 2024-08-18 LAB — VITAMIN B12: Vitamin B-12: 442 pg/mL (ref 180–914)

## 2024-08-18 MED ORDER — SODIUM CHLORIDE 0.9 % IV SOLN: INTRAVENOUS | Status: DC

## 2024-08-18 MED ORDER — PANTOPRAZOLE SODIUM 40 MG IV SOLR
40.0000 mg | Freq: Two times a day (BID) | INTRAVENOUS | Status: DC
  Administered 2024-08-18 – 2024-08-21 (×7): 40 mg via INTRAVENOUS
  Filled 2024-08-18 (×7): qty 10

## 2024-08-18 MED ORDER — SODIUM CHLORIDE 0.9 % IV BOLUS
500.0000 mL | Freq: Once | INTRAVENOUS | Status: AC
  Administered 2024-08-18: 500 mL via INTRAVENOUS

## 2024-08-18 MED ORDER — MIDODRINE HCL 5 MG PO TABS
5.0000 mg | ORAL_TABLET | Freq: Three times a day (TID) | ORAL | Status: DC
  Administered 2024-08-18: 5 mg via ORAL
  Filled 2024-08-18: qty 1

## 2024-08-18 MED ORDER — MIDODRINE HCL 5 MG PO TABS
10.0000 mg | ORAL_TABLET | Freq: Three times a day (TID) | ORAL | Status: DC
  Administered 2024-08-18 – 2024-08-24 (×18): 10 mg via ORAL
  Filled 2024-08-18 (×18): qty 2

## 2024-08-18 NOTE — Progress Notes (Signed)
 The patient is too weak to obtain orthostatic BP, she is not able to sit on side of the bed or stand up on her own.

## 2024-08-18 NOTE — Evaluation (Signed)
 Occupational Therapy Evaluation Patient Details Name: Becky Trevino MRN: [id number] DOB: 06-Jul-1946 Today's Date: 08/18/2024   History of Present Illness   78 y/o female presented to ED on 08/17/24 for AMS, hypotension and AKI in setting of recent falls. CT showed large midline vertex scalp hematoma. Admitted for acute renal failure and hypotension as well as acute metabolic encephalopathy. PMH: HTN, T2DM, wheelchair dependent, anticoagulation, Afib, obesity, chronic lymphedema     Clinical Impressions Patient presenting with decreased Ind in self care,balance, functional mobility, transfers, endurance, and safety awareness. Patient endorses living at home with spouse. He is currently in rehab and daughter assists her with self care and mobility. She endorses being able to ambulate with RW and to power wheelchair at baseline. Pt needing heavy total A of 2 for supine >sit with min - mod A for static sitting balance with involuntary jerky movements noted on  EOB. Pt fatigues quickly and total A of 3 needed to return to bed and boost up towards HOB. Patient will benefit from acute OT to increase overall independence in the areas of ADLs, functional mobility, and safety awareness in order to safely discharge.     If plan is discharge home, recommend the following:   Two people to help with bathing/dressing/bathroom;Two people to help with walking and/or transfers;Direct supervision/assist for medications management;Direct supervision/assist for financial management;Assist for transportation;Help with stairs or ramp for entrance;Supervision due to cognitive status     Functional Status Assessment   Patient has had a recent decline in their functional status and demonstrates the ability to make significant improvements in function in a reasonable and predictable amount of time.     Equipment Recommendations   Hoyer lift      Precautions/Restrictions   Precautions Precautions:  Fall Recall of Precautions/Restrictions: Impaired     Mobility Bed Mobility Overal bed mobility: Needs Assistance Bed Mobility: Supine to Sit, Sit to Supine     Supine to sit: Total assist, +2 for physical assistance, +2 for safety/equipment Sit to supine: Total assist, +2 for physical assistance, +2 for safety/equipment        Transfers                   General transfer comment: NT due to safety concerns      Balance Overall balance assessment: Needs assistance Sitting-balance support: No upper extremity supported, Feet supported Sitting balance-Leahy Scale: Poor Sitting balance - Comments: up to min-modA. Patient with involuntary jerking movements while sitting on EOB                                   ADL either performed or assessed with clinical judgement   ADL Overall ADL's : Needs assistance/impaired                                       General ADL Comments: total A of 2 for all mobility and self care     Vision Patient Visual Report: No change from baseline              Pertinent Vitals/Pain Pain Assessment Pain Assessment: Faces Faces Pain Scale: Hurts a little bit Pain Location: chest Pain Descriptors / Indicators: Sore Pain Intervention(s): Monitored during session     Extremity/Trunk Assessment Upper Extremity Assessment Upper Extremity Assessment: Generalized weakness   Lower Extremity  Assessment Lower Extremity Assessment: Generalized weakness       Communication Communication Communication: Impaired Factors Affecting Communication: Hearing impaired   Cognition Arousal: Alert Behavior During Therapy: WFL for tasks assessed/performed Cognition: No family/caregiver present to determine baseline, Cognition impaired   Orientation impairments: Time, Situation Awareness: Intellectual awareness intact   Attention impairment (select first level of impairment): Focused attention Executive functioning  impairment (select all impairments): Sequencing, Reasoning, Problem solving                   Following commands: Impaired Following commands impaired: Follows one step commands with increased time, Follows one step commands inconsistently     Cueing  General Comments   Cueing Techniques: Verbal cues;Gestural cues;Tactile cues              Home Living Family/patient expects to be discharged to:: Private residence Living Arrangements: Spouse/significant other;Children (husband currently in rehab) Available Help at Discharge: Family Type of Home: Apartment Home Access: Level entry     Home Layout: One level     Bathroom Shower/Tub: Tub/shower unit;Sponge bathes at baseline   Bathroom Toilet: Handicapped height     Home Equipment: Agricultural Consultant (2 wheels);Hospital bed;BSC/3in1;Wheelchair - power   Additional Comments: PLOF and home setup not reliable 2/2 poor cognition      Prior Functioning/Environment Prior Level of Function : Patient poor historian/Family not available             Mobility Comments: uses electric w/c for primary mobility. Has hospital bed ADLs Comments: per chart, husband normally assists with self care from bed level but pt he is in rehab and her daughter is assisting at this time.    OT Problem List: Decreased strength;Impaired balance (sitting and/or standing);Decreased cognition;Decreased safety awareness;Decreased activity tolerance   OT Treatment/Interventions: Self-care/ADL training;Therapeutic exercise;Patient/family education;Balance training;Energy conservation;Therapeutic activities      OT Goals(Current goals can be found in the care plan section)   Acute Rehab OT Goals Patient Stated Goal: to go home OT Goal Formulation: With patient Time For Goal Achievement: 09/01/24 Potential to Achieve Goals: Fair   OT Frequency:  Min 2X/week       AM-PAC OT 6 Clicks Daily Activity     Outcome Measure Help from another  person eating meals?: A Little Help from another person taking care of personal grooming?: A Little Help from another person toileting, which includes using toliet, bedpan, or urinal?: Total Help from another person bathing (including washing, rinsing, drying)?: Total Help from another person to put on and taking off regular upper body clothing?: Total Help from another person to put on and taking off regular lower body clothing?: Total 6 Click Score: 10   End of Session Nurse Communication: Mobility status  Activity Tolerance: Patient limited by fatigue Patient left: in bed;with call bell/phone within reach;with bed alarm set  OT Visit Diagnosis: Muscle weakness (generalized) (M62.81)                Time: 8855-8792 OT Time Calculation (min): 23 min Charges:  OT General Charges $OT Visit: 1 Visit OT Evaluation $OT Eval Moderate Complexity: 1 [address], [place], OTR/L , CBIS ascom [phone]  08/18/24, 3:25 PM

## 2024-08-18 NOTE — Evaluation (Signed)
 Physical Therapy Evaluation Patient Details Name: Becky Trevino MRN: [id number] DOB: Nov 20, 1945 Today's Date: 08/18/2024  History of Present Illness  78 y/o female presented to ED on 08/17/24 for AMS, hypotension and AKI in setting of recent falls. CT showed large midline vertex scalp hematoma. Admitted for acute renal failure and hypotension as well as acute metabolic encephalopathy. PMH: HTN, T2DM, wheelchair dependent, anticoagulation, Afib, obesity, chronic lymphedema  Clinical Impression  Patient admitted with the above. Patient resting semi supine in bed on arrival. Oriented to self and place. Unable to state correct date with cues and demonstrates poor awareness into deficits and current situation. Delayed responses and increased time to respond to cues, commands, and questions during session. Required totalA+2 for bed mobility with no active movement noted. Patient with minimal attempts at mobility. Reports that she typically can stand and transfer to w/c independently but not sure of accuracy 2/2 cognitive deficits. Patient will benefit from skilled PT services during acute stay to address listed deficits. Patient will benefit from ongoing therapy at discharge to maximize functional independence and safety.         If plan is discharge home, recommend the following: Two people to help with walking and/or transfers;Two people to help with bathing/dressing/bathroom;Assistance with cooking/housework;Assist for transportation;Help with stairs or ramp for entrance;Direct supervision/assist for medications management;Direct supervision/assist for financial management;Supervision due to cognitive status   Can travel by private vehicle   No    Equipment Recommendations Hoyer lift  Recommendations for Other Services       Functional Status Assessment Patient has had a recent decline in their functional status and/or demonstrates limited ability to make significant improvements in function in a  reasonable and predictable amount of time     Precautions / Restrictions Precautions Precautions: Fall Recall of Precautions/Restrictions: Impaired Restrictions Weight Bearing Restrictions Per Provider Order: No      Mobility  Bed Mobility Overal bed mobility: Needs Assistance Bed Mobility: Supine to Sit, Sit to Supine     Supine to sit: Total assist, +2 for physical assistance, +2 for safety/equipment Sit to supine: Total assist, +2 for physical assistance, +2 for safety/equipment        Transfers                   General transfer comment: NT due to safety concerns    Ambulation/Gait                  Stairs            Wheelchair Mobility     Tilt Bed    Modified Rankin (Stroke Patients Only)       Balance Overall balance assessment: Needs assistance Sitting-balance support: No upper extremity supported, Feet supported Sitting balance-Leahy Scale: Poor Sitting balance - Comments: up to min-modA. Patient with involuntary jerking movements while sitting on EOB                                     Pertinent Vitals/Pain Pain Assessment Pain Assessment: Faces Faces Pain Scale: Hurts a little bit Pain Location: chest Pain Descriptors / Indicators: Sore Pain Intervention(s): Monitored during session    Home Living Family/patient expects to be discharged to:: Private residence Living Arrangements: Spouse/significant other;Children (husband currently in rehab) Available Help at Discharge: Family               Additional Comments: PLOF and home setup  not reliable 2/2 poor cognition    Prior Function Prior Level of Function : Patient poor historian/Family not available             Mobility Comments: uses electric w/c for primary mobility. Has hospital bed       Extremity/Trunk Assessment   Upper Extremity Assessment Upper Extremity Assessment: Defer to OT evaluation    Lower Extremity Assessment Lower  Extremity Assessment: Generalized weakness       Communication   Communication Communication: Impaired Factors Affecting Communication: Hearing impaired    Cognition Arousal: Alert Behavior During Therapy: WFL for tasks assessed/performed   PT - Cognitive impairments: No family/caregiver present to determine baseline                       PT - Cognition Comments: Oriented to self and place. When asked about date, she states December 2nd. Therapist cues her that we are 2 days past that date and what the date was. Patient is unable to figure out the current date. Poor awareness into her deficits and situation Following commands: Impaired Following commands impaired: Follows one step commands with increased time, Follows one step commands inconsistently     Cueing       General Comments      Exercises     Assessment/Plan    PT Assessment Patient needs continued PT services  PT Problem List Decreased strength;Decreased activity tolerance;Decreased mobility;Decreased balance;Decreased cognition;Decreased knowledge of use of DME;Decreased safety awareness;Decreased knowledge of precautions;Pain;Cardiopulmonary status limiting activity       PT Treatment Interventions DME instruction;Functional mobility training;Therapeutic activities;Therapeutic exercise;Balance training;Neuromuscular re-education;Patient/family education    PT Goals (Current goals can be found in the Care Plan section)  Acute Rehab PT Goals Patient Stated Goal: did not state PT Goal Formulation: Patient unable to participate in goal setting Time For Goal Achievement: 09/01/24 Potential to Achieve Goals: Poor    Frequency Min 1X/week     Co-evaluation               AM-PAC PT 6 Clicks Mobility  Outcome Measure Help needed turning from your back to your side while in a flat bed without using bedrails?: Total Help needed moving from lying on your back to sitting on the side of a flat  bed without using bedrails?: Total Help needed moving to and from a bed to a chair (including a wheelchair)?: Total Help needed standing up from a chair using your arms (e.g., wheelchair or bedside chair)?: Total Help needed to walk in hospital room?: Total Help needed climbing 3-5 steps with a railing? : Total 6 Click Score: 6    End of Session   Activity Tolerance: Patient tolerated treatment well Patient left: in bed;with bed alarm set;with call bell/phone within reach Nurse Communication: Mobility status PT Visit Diagnosis: Muscle weakness (generalized) (M62.81);Other abnormalities of gait and mobility (R26.89)    Time: 8855-8796 PT Time Calculation (min) (ACUTE ONLY): 19 min   Charges:   PT Evaluation $PT Eval Moderate Complexity: 1 Mod   PT General Charges $$ ACUTE PT VISIT: 1 Visit         Maryanne Finder, PT, DPT Physical Therapist - [facility]  [facility]   Gessica Jawad A Deonta Bomberger 08/18/2024, 1:02 PM

## 2024-08-18 NOTE — Plan of Care (Signed)

## 2024-08-18 NOTE — Progress Notes (Addendum)
 Progress Note   Patient: Becky Trevino FMW:[id number] DOB: [birth year] DOA: 08/17/2024     0 DOS: the patient was seen and examined on 08/18/2024   Brief hospital course: 78 y.o. female with medical history significant for paroxysmal atrial fibrillation, HTN, SVT, pulmonary HTN, CKD stage 3b, T2DM, chronic DVT/lymphedema on pump, wheelchair/walker dependent. 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  Patient, who is wheelchair/walker dependent presented for the second time in 2 days for evaluation of somnolence/weakness resulting in recent falls.  Workup on 12/2 showed a large scalp hematoma and AKI.[name]  She was hydrated and family opted to take her home.  Patient remained difficult to arouse at home, thus prompting the return visit to the ED.  She did not fall again..  She has had no recent fever or chills, vomiting or diarrhea or other complaints. In the ED, BP as low as 84/47, improving to 114/70 with hydration then again downtrending to 91/51 by admission.  Heart rate was in the 60s and other vitals within normal limits. Labs notable for new anemia of 9.8, down from baseline of 12.8 the day prior,Creatinine of 2.31 above baseline of 1.3 and up from the day prior of 1.73.  Potassium 5.3 with magnesium  2.9.  Baseline thrombocytopenia of 135,000. Troponin 38 Urinalysis without infection, respiratory viral panel negative for COVID flu and RSV. EKG showed sinus of 62 Chest x-ray nonacute CT head from the day prior (12/2) showed a large midline vertex scalp hematoma with no acute intracranial findings CT C-spine from the day prior was nonacute Patient treated with 2 L LR.  12/4.  Patient complains of back pain.  Patient with hypotension requiring fluid boluses.  Bladder scan showing empty bladder.  Patient had a drop in  hemoglobin with hemoglobin down to 8.7.  Assessment and Plan: * Hypotension Patient with poor urine output and bladder scan showing empty bladder.  Will give another 2 fluid boluses and IV fluid hydration.  Hold antihypertensive medication.  Start midodrine .  Acute renal failure superimposed on stage 3b chronic kidney disease (HCC) Creatinine uptrending at 2.31, up from 1.73 the day prior (baseline 1.3) in spite of hydration. 2 IV fluid boluses and IV fluid hydration.  ABLA (acute blood loss anemia) versus hemodilutional anemia Large scalp hematoma on head CT 08/16/2024 Chronic anticoagulation Hemoglobin down from 8.7.  Patient may end up needing a transfusion.  Watch for signs of bleeding.  Empiric Protonix.  Electrolyte disturbance Hyperkalemia and hypermagnesemia IV fluid hydration.  Lokelma.  History of recurrent group B strep bacteremia Followed by ID, last seen 06/2024 Continue suppressive amoxicillin  twice daily Acute infection not suspected at this time  Acute metabolic encephalopathy Patient able to answer questions appropriately.  Likely secondary to low blood pressure and acute kidney injury.  Type 2 diabetes mellitus with diabetic neuropathy, without long-term current use of insulin  (HCC) Sliding scale insulin  coverage, hold home Farxiga .  Last hemoglobin A1c low at 5.7.  AF (paroxysmal atrial fibrillation) (HCC) Paroxysmal in nature holding apixaban  pending further evaluation with head CT Continue amiodarone  Will hold metoprolol  until blood pressure improves  Scalp hematoma secondary to fall, subsequent encounter Hold Eliquis   Obesity, Class III, BMI 40-49.9 (morbid obesity) (HCC) OSA BMI 40.85  Falls, subsequent encounter Wheelchair/walker dependent Chronic lymphedema on lymphedema pumps PT and OT evaluations        Subjective: Patient complains of  back pain.  She had a slip out of bed and may have scraped her back.  She does have a hematoma on the  back of her left leg.  No bleeding as per patient.  As of this morning has not made any urine.  Physical Exam: Vitals:   08/18/24 0051 08/18/24 0515 08/18/24 0814 08/18/24 1212  BP: (!) 96/46 (!) 102/47 (!) 102/49 (!) 67/38  Pulse: (!) 59 61 62 62  Resp: 19 19 18 16   Temp: 97.6 F (36.4 C) (!) 97.3 F (36.3 C) 97.9 F (36.6 C) 98.5 F (36.9 C)  TempSrc: Oral Oral Oral Oral  SpO2: 97% 93% 100% 93%  Weight:      Height:       Physical Exam HENT:     Head: Normocephalic.  Eyes:     General: Lids are normal.  Cardiovascular:     Rate and Rhythm: Normal rate and regular rhythm.     Heart sounds: Normal heart sounds, S1 normal and S2 normal.  Pulmonary:     Breath sounds: No decreased breath sounds, wheezing, rhonchi or rales.  Abdominal:     Palpations: Abdomen is soft.     Tenderness: There is no abdominal tenderness.  Musculoskeletal:     Right lower leg: Swelling present.     Left lower leg: Swelling present.  Skin:    General: Skin is warm.     Comments: Hematoma on back of the left calf.  Bruising behind left leg.  Neurological:     Mental Status: She is alert.     Data Reviewed: Sodium 140, potassium 5.3, creatinine 2.36, GFR 20, folate 6.8, vitamin B12 442, hemoglobin 8.7, white blood count 7.1, platelet count 117 Family Communication: Daughter at bedside  Disposition: Status is: Inpatient Remains inpatient appropriate because: Patient with hypotension and acute kidney injury and drop in hemoglobin.  Need to watch closely.  Starting midodrine  fluid bolus.  May end up needing transfusion.  Watch for signs of bleeding  Planned Discharge Destination: To be determined    Time spent: 29 minutes  Author: Charlie Patterson, MD 08/18/2024 12:29 PM  For on call review [email or web address].

## 2024-08-18 NOTE — TOC Initial Note (Deleted)
 Transition of Care [facility]) - Initial/Assessment Note    Patient Details  Name: Becky Trevino MRN: [id number] Date of Birth: 11-11-1945  [facility]) CM/SW Contact:    Dalia GORMAN Fuse, RN Phone Number: 08/18/2024, 1:39 PM  Clinical Narrative:                  13:30: Therapy recs are for Home with HH PT/OT 3 in 1 BSC and RW. TOC met with the patient, her husband, and daughter. They are in agreement with [facility] PT/OT. There preference is for [facility] or [facility]. They would like the  RW, but declined the 3 in 1 BSC. The patient is from home with her husband and ambulates independently with assistive devices. She has no trouble bathing and dressing and getting to and from the bathroom. Her husband drives her to and from appts and to run errands.  TOC will continue to follow for dc planning       Patient Goals and CMS Choice            Expected Discharge Plan and Services                                              Prior Living Arrangements/Services                       Activities of Daily Living   ADL Screening (condition at time of admission) Independently performs ADLs?: Yes (appropriate for developmental age)  Permission Sought/Granted                  Emotional Assessment              Admission diagnosis:  AKI (acute kidney injury) [N17.9] Acute metabolic encephalopathy [G93.41] Patient Active Problem List   Diagnosis Date Noted   Acute metabolic encephalopathy 08/17/2024   ABLA (acute blood loss anemia) versus hemodilutional anemia 08/17/2024   Chronic anticoagulation 08/17/2024   Falls, subsequent encounter 08/17/2024   Obesity, Class III, BMI 40-49.9 (morbid obesity) (HCC) 08/17/2024   Electrolyte disturbance 08/17/2024   Esophageal dysmotility 07/02/2023   Chronic deep vein thrombosis (DVT) of tibial vein of right lower extremity (HCC) 07/02/2023   Type 2 diabetes mellitus with diabetic neuropathy, without  long-term current use of insulin  (HCC) 07/02/2023   B12 deficiency 07/02/2023   Other dysphagia 05/13/2023   Atherosclerosis of native arteries of extremity with intermittent claudication 10/16/2022   Anemia due to chronic kidney disease 08/26/2022   Hypothyroidism (acquired) 01/27/2022   PAD (peripheral artery disease) 09/27/2021   AF (paroxysmal atrial fibrillation) (HCC) 09/15/2021   Primary osteoarthritis of right hip 07/18/2021   Atherosclerosis of aorta 06/07/2021   Acute renal failure superimposed on stage 3b chronic kidney disease (HCC)    Hypotension    Mild protein-calorie malnutrition    Hyperbilirubinemia    Injury of left femoral nerve 08/28/2020   Lymphedema 06/25/2020   History of recurrent group B strep bacteremia 02/10/2020   History of deep vein thrombosis 10/30/2019   Scalp hematoma secondary to fall, subsequent encounter    Chronic infection of right knee (HCC) 11/25/2018   Infection and inflammatory reaction due to internal joint prosthesis, subsequent encounter 01/25/2018   Infection of total right knee replacement 11/19/2017   Type 2 diabetes mellitus with stage 3b chronic kidney disease, without long-term current  use of insulin  (HCC) 11/06/2016   Primary osteoarthritis of knee 03/11/2016   Primary osteoarthritis of left hip 12/11/2015   Bilateral lower extremity edema 04/21/2015   Chronic kidney disease (CKD), stage III (moderate) (HCC) 04/21/2015   Chronic venous insufficiency 04/21/2015   DDD (degenerative disc disease), cervical 04/21/2015   Diabetes mellitus with renal manifestation (HCC) 04/21/2015   Dyslipidemia 04/21/2015   Bladder cystocele 04/21/2015   Urinary incontinence 04/21/2015   Leg varices 04/21/2015   Insomnia 04/21/2015   Eczema intertrigo 04/21/2015   Morbid obesity (HCC) 04/21/2015   OSA (obstructive sleep apnea) 04/21/2015   Osteoarthritis of multiple joints 04/21/2015   H/O Spinal surgery 11/14/2013   Detrusor muscle hypertonia  11/07/2013   PCP:  Sowles, Krichna, MD Pharmacy:   [facility] [address] [place] - [address] [place] [address] Phone: [phone] Fax: [phone]  [facility] - [place], [place] - [address] [place] [address] Phone: [phone] Fax: [phone]     Social Drivers of Health (SDOH) Social History: SDOH Screenings   Food Insecurity: Patient Declined (08/18/2024)  Housing: Patient Declined (08/18/2024)  Transportation Needs: Patient Declined (08/18/2024)  Utilities: Patient Declined (08/18/2024)  Alcohol Screen: Low Risk  (08/06/2023)  Depression (PHQ2-9): Low Risk  (07/21/2024)  Financial Resource Strain: Low Risk  (08/15/2024)   Received from [facility]  Physical Activity: Insufficiently Active (08/06/2023)  Social Connections: Patient Declined (08/18/2024)  Stress: No Stress Concern Present (08/06/2023)  Tobacco Use: Low Risk  (07/29/2024)  Health Literacy: Adequate Health Literacy (08/06/2023)   SDOH Interventions:     Readmission Risk Interventions     No data to display

## 2024-08-18 NOTE — Hospital Course (Addendum)
 78 y.o. female with medical history significant for paroxysmal atrial fibrillation, HTN, SVT, pulmonary HTN, CKD stage 3b, T2DM, chronic DVT/lymphedema on pump, wheelchair/walker dependent. 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  Patient, who is wheelchair/walker dependent presented for the second time in 2 days for evaluation of somnolence/weakness resulting in recent falls.  Workup on 12/2 showed a large scalp hematoma and AKI.[name]  She was hydrated and family opted to take her home.  Patient remained difficult to arouse at home, thus prompting the return visit to the ED.  She did not fall again..  She has had no recent fever or chills, vomiting or diarrhea or other complaints. In the ED, BP as low as 84/47, improving to 114/70 with hydration then again downtrending to 91/51 by admission.  Heart rate was in the 60s and other vitals within normal limits. Labs notable for new anemia of 9.8, down from baseline of 12.8 the day prior,Creatinine of 2.31 above baseline of 1.3 and up from the day prior of 1.73.  Potassium 5.3 with magnesium  2.9.  Baseline thrombocytopenia of 135,000. Troponin 38 Urinalysis without infection, respiratory viral panel negative for COVID flu and RSV. EKG showed sinus of 62 Chest x-ray nonacute CT head from the day prior (12/2) showed a large midline vertex scalp hematoma with no acute intracranial findings CT C-spine from the day prior was nonacute Patient treated with 2 L LR.  12/4.  Patient complains of back pain.  Patient with hypotension requiring fluid boluses.  Bladder scan showing empty bladder.  Patient had a drop in hemoglobin with hemoglobin down to 8.7. 12/5.  Transfuse 1 unit of blood since hemoglobin down to 7.8.  Continue IV fluids.  Continue to hold Eliquis . 12/6.  Hemoglobin 8.4.   Will try to discontinue IV fluids since creatinine down to 1.84.  Continue midodrine  for hypotension. 12/7.  Hemoglobin 8.7.  Try to restart Eliquis  this evening.  Creatinine down to 1.6.

## 2024-08-18 NOTE — Assessment & Plan Note (Addendum)
 Continue midodrine .  Hold antihypertensive medications.  Try to get rid of IV fluids today and see what happens with blood pressure.

## 2024-08-18 NOTE — Progress Notes (Addendum)
 TOC Brief Assessment  Patient is wheelchair/walker dependent presented for the second time in 2 days for evaluation of somnolence/weakness resulting in recent falls. HGB 9.8 which is down from 12.8 the day prior. Admitted with metabolic encephalopathy, neuro checks ordered, being worked up for anemia. No Therapy Recs at this time. Please outreach to [facility] if needs arise.

## 2024-08-18 NOTE — Assessment & Plan Note (Addendum)
 Will restart Eliquis

## 2024-08-19 ENCOUNTER — Inpatient Hospital Stay

## 2024-08-19 DIAGNOSIS — E878 Other disorders of electrolyte and fluid balance, not elsewhere classified: Secondary | ICD-10-CM | POA: Diagnosis not present

## 2024-08-19 DIAGNOSIS — D62 Acute posthemorrhagic anemia: Secondary | ICD-10-CM | POA: Diagnosis not present

## 2024-08-19 DIAGNOSIS — I9589 Other hypotension: Secondary | ICD-10-CM | POA: Diagnosis not present

## 2024-08-19 DIAGNOSIS — N179 Acute kidney failure, unspecified: Secondary | ICD-10-CM | POA: Diagnosis not present

## 2024-08-19 LAB — CBC
HCT: 24.8 % — ABNORMAL LOW (ref 36.0–46.0)
Hemoglobin: 7.8 g/dL — ABNORMAL LOW (ref 12.0–15.0)
MCH: 32.4 pg (ref 26.0–34.0)
MCHC: 31.5 g/dL (ref 30.0–36.0)
MCV: 102.9 fL — ABNORMAL HIGH (ref 80.0–100.0)
Platelets: 114 K/uL — ABNORMAL LOW (ref 150–400)
RBC: 2.41 MIL/uL — ABNORMAL LOW (ref 3.87–5.11)
RDW: 18.8 % — ABNORMAL HIGH (ref 11.5–15.5)
WBC: 8 K/uL (ref 4.0–10.5)
nRBC: 3.5 % — ABNORMAL HIGH (ref 0.0–0.2)

## 2024-08-19 LAB — PREPARE RBC (CROSSMATCH)

## 2024-08-19 LAB — BASIC METABOLIC PANEL WITH GFR
Anion gap: 11 (ref 5–15)
BUN: 38 mg/dL — ABNORMAL HIGH (ref 8–23)
CO2: 24 mmol/L (ref 22–32)
Calcium: 8.2 mg/dL — ABNORMAL LOW (ref 8.9–10.3)
Chloride: 111 mmol/L (ref 98–111)
Creatinine, Ser: 2.22 mg/dL — ABNORMAL HIGH (ref 0.44–1.00)
GFR, Estimated: 22 mL/min — ABNORMAL LOW (ref >60)
Glucose, Bld: 87 mg/dL (ref 70–99)
Potassium: 4.9 mmol/L (ref 3.5–5.1)
Sodium: 146 mmol/L — ABNORMAL HIGH (ref 135–145)

## 2024-08-19 MED ORDER — SODIUM CHLORIDE 0.9% IV SOLUTION: Freq: Once | INTRAVENOUS | Status: AC

## 2024-08-19 NOTE — NC FL2 (Signed)
 [place]  MEDICAID FL2 LEVEL OF CARE FORM     IDENTIFICATION  Patient Name: Becky Trevino Birthdate: 14-Jul-1946 Sex: female Admission Date (Current Location): 08/17/2024  [facility] and [place] Number:  Chiropodist and Address:  [facility], [address] [place], [place] [address]      Provider Number: [id number]  Attending Physician Name and Address:  Josette Ade, MD  Relative Name and Phone Number:  tanda Houston (Niece)  [phone]    Current Level of Care: Hospital Recommended Level of Care: Skilled Nursing Facility Prior Approval Number:    Date Approved/Denied:   PASRR Number: [id number]  Discharge Plan: Home    Current Diagnoses: Patient Active Problem List   Diagnosis Date Noted   Acute metabolic encephalopathy 08/17/2024   ABLA (acute blood loss anemia) versus hemodilutional anemia 08/17/2024   Chronic anticoagulation 08/17/2024   Falls, subsequent encounter 08/17/2024   Obesity, Class III, BMI 40-49.9 (morbid obesity) (HCC) 08/17/2024   Electrolyte disturbance 08/17/2024   Esophageal dysmotility 07/02/2023   Chronic deep vein thrombosis (DVT) of tibial vein of right lower extremity (HCC) 07/02/2023   Type 2 diabetes mellitus with diabetic neuropathy, without long-term current use of insulin  (HCC) 07/02/2023   B12 deficiency 07/02/2023   Other dysphagia 05/13/2023   Atherosclerosis of native arteries of extremity with intermittent claudication 10/16/2022   Anemia due to chronic kidney disease 08/26/2022   Hypothyroidism (acquired) 01/27/2022   PAD (peripheral artery disease) 09/27/2021   AF (paroxysmal atrial fibrillation) (HCC) 09/15/2021   Primary osteoarthritis of right hip 07/18/2021   Atherosclerosis of aorta 06/07/2021   Acute renal failure superimposed on stage 3b chronic kidney disease (HCC)    Hypotension    Mild protein-calorie malnutrition    Hyperbilirubinemia    Injury of left femoral nerve  08/28/2020   Lymphedema 06/25/2020   History of recurrent group B strep bacteremia 02/10/2020   History of deep vein thrombosis 10/30/2019   Scalp hematoma secondary to fall, subsequent encounter    Chronic infection of right knee (HCC) 11/25/2018   Infection and inflammatory reaction due to internal joint prosthesis, subsequent encounter 01/25/2018   Infection of total right knee replacement 11/19/2017   Type 2 diabetes mellitus with stage 3b chronic kidney disease, without long-term current use of insulin  (HCC) 11/06/2016   Primary osteoarthritis of knee 03/11/2016   Primary osteoarthritis of left hip 12/11/2015   Bilateral lower extremity edema 04/21/2015   Chronic kidney disease (CKD), stage III (moderate) (HCC) 04/21/2015   Chronic venous insufficiency 04/21/2015   DDD (degenerative disc disease), cervical 04/21/2015   Diabetes mellitus with renal manifestation (HCC) 04/21/2015   Dyslipidemia 04/21/2015   Bladder cystocele 04/21/2015   Urinary incontinence 04/21/2015   Leg varices 04/21/2015   Insomnia 04/21/2015   Eczema intertrigo 04/21/2015   Morbid obesity (HCC) 04/21/2015   OSA (obstructive sleep apnea) 04/21/2015   Osteoarthritis of multiple joints 04/21/2015   H/O Spinal surgery 11/14/2013   Detrusor muscle hypertonia 11/07/2013    Orientation RESPIRATION BLADDER Height & Weight     Self, Time, Situation, Place    Continent Weight: 132.9 kg Height:  5' 11 (180.3 cm)  BEHAVIORAL SYMPTOMS/MOOD NEUROLOGICAL BOWEL NUTRITION STATUS      Continent Diet (Regular)  AMBULATORY STATUS COMMUNICATION OF NEEDS Skin   Extensive Assist Verbally                         Personal Care Assistance Level of Assistance  Bathing,  Feeding, Dressing Bathing Assistance: Maximum assistance Feeding assistance: Limited assistance Dressing Assistance: Maximum assistance     Functional Limitations Info             SPECIAL CARE FACTORS FREQUENCY  PT (By licensed PT), OT (By  licensed OT)     PT Frequency: 5 x week OT Frequency: 5 x week            Contractures      Additional Factors Info  Allergies, Code Status Code Status Info: FULL Allergies Info: Lisinopril           Current Medications (08/19/2024):  This is the current hospital active medication list Current Facility-Administered Medications  Medication Dose Route Frequency Provider Last Rate Last Admin   0.9 %  sodium chloride  infusion   Intravenous Continuous Josette Ade, MD 100 mL/hr at 08/19/24 0721 New Bag at 08/19/24 0721   acetaminophen  (TYLENOL ) tablet 650 mg  650 mg Oral Q6H PRN Duncan, Hazel V, MD       Or   acetaminophen  (TYLENOL ) suppository 650 mg  650 mg Rectal Q6H PRN Cleatus Delayne GAILS, MD       amiodarone  (PACERONE ) tablet 200 mg  200 mg Oral Daily Duncan, Hazel V, MD   200 mg at 08/19/24 9093   amoxicillin  (AMOXIL ) capsule 500 mg  500 mg Oral BID Duncan, Hazel V, MD   500 mg at 08/19/24 9092   atorvastatin  (LIPITOR) tablet 40 mg  40 mg Oral QPM Duncan, Hazel V, MD   40 mg at 08/18/24 1633   escitalopram  (LEXAPRO ) tablet 5 mg  5 mg Oral Daily Duncan, Hazel V, MD   5 mg at 08/19/24 9093   levothyroxine  (SYNTHROID ) tablet 75 mcg  75 mcg Oral Q0600 Cleatus Delayne GAILS, MD   75 mcg at 08/19/24 0602   methocarbamol  (ROBAXIN ) injection 500 mg  500 mg Intravenous Q6H PRN Cleatus Delayne GAILS, MD       midodrine  (PROAMATINE ) tablet 10 mg  10 mg Oral TID WC Josette Ade, MD   10 mg at 08/19/24 9093   ondansetron  (ZOFRAN ) tablet 4 mg  4 mg Oral Q6H PRN Duncan, Hazel V, MD       Or   ondansetron  (ZOFRAN ) injection 4 mg  4 mg Intravenous Q6H PRN Duncan, Hazel V, MD       oxyCODONE  (Oxy IR/ROXICODONE ) immediate release tablet 5 mg  5 mg Oral Q4H PRN Duncan, Hazel V, MD   5 mg at 08/18/24 9085   pantoprazole  (PROTONIX ) injection 40 mg  40 mg Intravenous Q12H Josette Ade, MD   40 mg at 08/19/24 9093   traZODone  (DESYREL ) tablet 25 mg  25 mg Oral QHS PRN Duncan, Hazel V, MD          Discharge Medications: Please see discharge summary for a list of discharge medications.  Relevant Imaging Results:  Relevant Lab Results:   Additional Information ss [id number]  Dalia GORMAN Fuse, RN

## 2024-08-19 NOTE — TOC Initial Note (Addendum)
 Transition of Care [facility]) - Initial/Assessment Note    Patient Details  Name: Becky Trevino MRN: [id number] Date of Birth: 17-Jan-1946  Transition of Care [facility]) CM/SW Contact:    Dalia GORMAN Fuse, RN Phone Number: 08/19/2024, 1:00 PM  Clinical Narrative:                 TOC spoke with the patient and her daughter in the room. Recs are for STR, the patient declined SNF at this time. She lives at home with her husband ( he will be discharged from STR soon), daughter and her niece. Her family assists her with her care she has an electric chair, hospital bed, BSC, and gait belt. They would like for the patient to go home with [facility] and a lift to assist with standing. The patient gave [facility] permission to speak with her niece.  TOC placed call to the patient's niece and she advised that they have been taking care of the patient at home. The patient's leg weakness and fluid retention is ongoing. The patient has been increasingly weak and they are hoping that the blood infusions will help. They do not have a preference for [facility] companies.   TOC sent message to MD and PT Student to make them aware the patient declines SNF and the patient and family requests a lift.  TOC sent referral for [facility] PT/OT/RN/Aide out in hub, [facility] accepted. TOC selected [facility] in the hub. Referral for hoyer lift sent to [employer], [place] accepted.   TOC will continue to follow.  Expected Discharge Plan: Home w Home Health Services Barriers to Discharge: Continued Medical Work up   Patient Goals and CMS Choice     Choice offered to / list presented to : Patient      Expected Discharge Plan and Services     Post Acute Care Choice: Home Health, Durable Medical Equipment Living arrangements for the past 2 months: Single Family Home                                      Prior Living Arrangements/Services Living arrangements for the past 2 months: Single Family Home Lives with:: Adult Children, Spouse                    Activities of Daily Living   ADL Screening (condition at time of admission) Independently performs ADLs?: Yes (appropriate for developmental age)  Permission Sought/Granted                  Emotional Assessment              Admission diagnosis:  AKI (acute kidney injury) [N17.9] Acute metabolic encephalopathy [G93.41] Patient Active Problem List   Diagnosis Date Noted   Acute metabolic encephalopathy 08/17/2024   ABLA (acute blood loss anemia) versus hemodilutional anemia 08/17/2024   Chronic anticoagulation 08/17/2024   Falls, subsequent encounter 08/17/2024   Obesity, Class III, BMI 40-49.9 (morbid obesity) (HCC) 08/17/2024   Electrolyte disturbance 08/17/2024   Esophageal dysmotility 07/02/2023   Chronic deep vein thrombosis (DVT) of tibial vein of right lower extremity (HCC) 07/02/2023   Type 2 diabetes mellitus with diabetic neuropathy, without long-term current use of insulin  (HCC) 07/02/2023   B12 deficiency 07/02/2023   Other dysphagia 05/13/2023   Atherosclerosis of native arteries of extremity with intermittent claudication 10/16/2022   Anemia due to chronic kidney disease 08/26/2022   Hypothyroidism (  acquired) 01/27/2022   PAD (peripheral artery disease) 09/27/2021   AF (paroxysmal atrial fibrillation) (HCC) 09/15/2021   Primary osteoarthritis of right hip 07/18/2021   Atherosclerosis of aorta 06/07/2021   Acute renal failure superimposed on stage 3b chronic kidney disease (HCC)    Hypotension    Mild protein-calorie malnutrition    Hyperbilirubinemia    Injury of left femoral nerve 08/28/2020   Lymphedema 06/25/2020   History of recurrent group B strep bacteremia 02/10/2020   History of deep vein thrombosis 10/30/2019   Scalp hematoma secondary to fall, subsequent encounter    Chronic infection of right knee (HCC) 11/25/2018   Infection and inflammatory reaction due to internal joint prosthesis, subsequent encounter 01/25/2018    Infection of total right knee replacement 11/19/2017   Type 2 diabetes mellitus with stage 3b chronic kidney disease, without long-term current use of insulin  (HCC) 11/06/2016   Primary osteoarthritis of knee 03/11/2016   Primary osteoarthritis of left hip 12/11/2015   Bilateral lower extremity edema 04/21/2015   Chronic kidney disease (CKD), stage III (moderate) (HCC) 04/21/2015   Chronic venous insufficiency 04/21/2015   DDD (degenerative disc disease), cervical 04/21/2015   Diabetes mellitus with renal manifestation (HCC) 04/21/2015   Dyslipidemia 04/21/2015   Bladder cystocele 04/21/2015   Urinary incontinence 04/21/2015   Leg varices 04/21/2015   Insomnia 04/21/2015   Eczema intertrigo 04/21/2015   Morbid obesity (HCC) 04/21/2015   OSA (obstructive sleep apnea) 04/21/2015   Osteoarthritis of multiple joints 04/21/2015   H/O Spinal surgery 11/14/2013   Detrusor muscle hypertonia 11/07/2013   PCP:  Sowles, Krichna, MD Pharmacy:   [facility] [address], [place] - [address] [place] [address] Phone: [phone] Fax: [phone]  [facility] - [place], [place] - [address] [place] [address] Phone: [phone] Fax: [phone]     Social Drivers of Health (SDOH) Social History: SDOH Screenings   Food Insecurity: Patient Declined (08/18/2024)  Housing: Patient Declined (08/18/2024)  Transportation Needs: Patient Declined (08/18/2024)  Utilities: Patient Declined (08/18/2024)  Alcohol Screen: Low Risk  (08/06/2023)  Depression (PHQ2-9): Low Risk  (07/21/2024)  Financial Resource Strain: Low Risk  (08/15/2024)   Received from [facility]  Physical Activity: Insufficiently Active (08/06/2023)  Social Connections: Patient Declined (08/18/2024)  Stress: No Stress Concern Present (08/06/2023)  Tobacco Use: Low Risk  (07/29/2024)  Health Literacy: Adequate Health Literacy (08/06/2023)   SDOH  Interventions:     Readmission Risk Interventions     No data to display

## 2024-08-19 NOTE — Assessment & Plan Note (Addendum)
 Sonogram lower extremity shows chronic DVT.  Would like to check hemoglobin another day prior to restarting Eliquis .

## 2024-08-19 NOTE — Plan of Care (Signed)

## 2024-08-19 NOTE — Progress Notes (Signed)
 Narrative for [employer]  Patient needs hoyer lift due to diagnosis. Patient will use hoyer to transfer in and out of the bed. Patient has caregiver who is able to assist with the use of the hoyer lift.

## 2024-08-19 NOTE — Progress Notes (Signed)
 Progress Note   Patient: Becky Trevino FMW:[id number] DOB: 1946/03/21 DOA: 08/17/2024     1 DOS: the patient was seen and examined on 08/19/2024   Brief hospital course: 78 y.o. female with medical history significant for paroxysmal atrial fibrillation, HTN, SVT, pulmonary HTN, CKD stage 3b, T2DM, chronic DVT/lymphedema on pump, wheelchair/walker dependent. 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  Patient, who is wheelchair/walker dependent presented for the second time in 2 days for evaluation of somnolence/weakness resulting in recent falls.  Workup on 12/2 showed a large scalp hematoma and AKI.[name]  She was hydrated and family opted to take her home.  Patient remained difficult to arouse at home, thus prompting the return visit to the ED.  She did not fall again..  She has had no recent fever or chills, vomiting or diarrhea or other complaints. In the ED, BP as low as 84/47, improving to 114/70 with hydration then again downtrending to 91/51 by admission.  Heart rate was in the 60s and other vitals within normal limits. Labs notable for new anemia of 9.8, down from baseline of 12.8 the day prior,Creatinine of 2.31 above baseline of 1.3 and up from the day prior of 1.73.  Potassium 5.3 with magnesium  2.9.  Baseline thrombocytopenia of 135,000. Troponin 38 Urinalysis without infection, respiratory viral panel negative for COVID flu and RSV. EKG showed sinus of 62 Chest x-ray nonacute CT head from the day prior (12/2) showed a large midline vertex scalp hematoma with no acute intracranial findings CT C-spine from the day prior was nonacute Patient treated with 2 L LR.  12/4.  Patient complains of back pain.  Patient with hypotension requiring fluid boluses.  Bladder scan showing empty bladder.  Patient had a drop in  hemoglobin with hemoglobin down to 8.7. 12/5.  Transfuse 1 unit of blood since hemoglobin down to 7.8.  Continue IV fluids.  Continue to hold Eliquis .  Assessment and Plan: * Hypotension Continue midodrine .  Hold antihypertensive medications.  Gentle IV fluids.  Give 1 unit of blood for hemoglobin of 7.8.  Acute renal failure superimposed on stage 3b chronic kidney disease (HCC) Creatinine 2.22 today.  Creatinine 1.74 on presentation.  Creatinine peaked at 2.36.  Chronic anticoagulation Will get sonogram of the legs since we are holding anticoagulation  ABLA (acute blood loss anemia) versus hemodilutional anemia Large scalp hematoma on head CT 08/16/2024 Chronic anticoagulation Hemoglobin down to 7.8.  Transfuse 1 unit of packed red blood cells today.  Benefits and risk explained to patient, niece and daughter.  Electrolyte disturbance Hyperkalemia and hypermagnesemia IV fluid hydration.  Potassium improved. Hypernatremia.  Sodium 1 point higher than the normal range  History of recurrent group B strep bacteremia Followed by ID, last seen 06/2024 Continue suppressive amoxicillin  twice daily Acute infection not suspected at this time  Acute metabolic encephalopathy Patient able to answer questions appropriately.  Likely secondary to low blood pressure and acute kidney injury.  Type 2 diabetes mellitus with diabetic neuropathy, without long-term current use of insulin  (HCC) Sliding scale insulin  coverage, hold home Farxiga .  Last hemoglobin A1c low at 5.7.  AF (paroxysmal atrial fibrillation) (HCC) Paroxysmal in nature holding apixaban  pending further evaluation with head CT Continue amiodarone  Will hold metoprolol  until blood pressure improves  Scalp hematoma secondary to fall, subsequent encounter Hold Eliquis   Obesity, Class III, BMI 40-49.9 (morbid obesity) (HCC)  OSA BMI 40.85  Falls, subsequent encounter Wheelchair/walker dependent Chronic lymphedema on lymphedema  pumps PT and OT evaluations        Subjective: Patient feeling okay today.  Blood pressure still on the lower side.  On midodrine  and IV fluids.  Will give a unit of blood with hemoglobin drifting down to 7.8.  Physical Exam: Vitals:   08/19/24 0402 08/19/24 0833 08/19/24 1114 08/19/24 1132  BP: (!) 91/49 (!) 96/59 (!) 92/37 (!) 90/42  Pulse:  65 64 65  Resp:  19 18 18   Temp:  98.3 F (36.8 C) 98.3 F (36.8 C) 98 F (36.7 C)  TempSrc:  Oral  Oral  SpO2:  96% 99% 100%  Weight:      Height:       Physical Exam HENT:     Head: Normocephalic.  Eyes:     General: Lids are normal.  Cardiovascular:     Rate and Rhythm: Normal rate and regular rhythm.     Heart sounds: Normal heart sounds, S1 normal and S2 normal.  Pulmonary:     Breath sounds: No decreased breath sounds, wheezing, rhonchi or rales.  Abdominal:     Palpations: Abdomen is soft.     Tenderness: There is no abdominal tenderness.  Musculoskeletal:     Right lower leg: Swelling present.     Left lower leg: Swelling present.  Skin:    General: Skin is warm.     Comments: Hematoma on back of the left calf.  Bruising behind left leg.  Neurological:     Mental Status: She is alert.     Data Reviewed: Creatinine 2.22, sodium 146, potassium 4.9, GFR 22, white blood cell count 8.0, hemoglobin 7.8, platelet count 114  Family Communication: Spoke with niece and daughter  Disposition: Status is: Inpatient Remains inpatient appropriate because: Patient still very weak.  Treating for acute kidney injury with IV fluids and blood.  Transfusing blood for drop in hemoglobin down to 7.8.  Planned Discharge Destination: Physical therapy recommending rehab but patient will go home with home health.    Time spent: 29 minutes  Author: Charlie Patterson, MD 08/19/2024 1:56 PM  For on call review [email or web address].

## 2024-08-20 DIAGNOSIS — E878 Other disorders of electrolyte and fluid balance, not elsewhere classified: Secondary | ICD-10-CM | POA: Diagnosis not present

## 2024-08-20 DIAGNOSIS — D62 Acute posthemorrhagic anemia: Secondary | ICD-10-CM | POA: Diagnosis not present

## 2024-08-20 DIAGNOSIS — I9589 Other hypotension: Secondary | ICD-10-CM | POA: Diagnosis not present

## 2024-08-20 DIAGNOSIS — N179 Acute kidney failure, unspecified: Secondary | ICD-10-CM | POA: Diagnosis not present

## 2024-08-20 DIAGNOSIS — Z7901 Long term (current) use of anticoagulants: Secondary | ICD-10-CM

## 2024-08-20 LAB — CBC
HCT: 27.2 % — ABNORMAL LOW (ref 36.0–46.0)
Hemoglobin: 8.4 g/dL — ABNORMAL LOW (ref 12.0–15.0)
MCH: 30.8 pg (ref 26.0–34.0)
MCHC: 30.9 g/dL (ref 30.0–36.0)
MCV: 99.6 fL (ref 80.0–100.0)
Platelets: 121 K/uL — ABNORMAL LOW (ref 150–400)
RBC: 2.73 MIL/uL — ABNORMAL LOW (ref 3.87–5.11)
RDW: 21.5 % — ABNORMAL HIGH (ref 11.5–15.5)
WBC: 9.1 K/uL (ref 4.0–10.5)
nRBC: 2.4 % — ABNORMAL HIGH (ref 0.0–0.2)

## 2024-08-20 LAB — BASIC METABOLIC PANEL WITH GFR
Anion gap: 7 (ref 5–15)
BUN: 30 mg/dL — ABNORMAL HIGH (ref 8–23)
CO2: 24 mmol/L (ref 22–32)
Calcium: 8.3 mg/dL — ABNORMAL LOW (ref 8.9–10.3)
Chloride: 112 mmol/L — ABNORMAL HIGH (ref 98–111)
Creatinine, Ser: 1.84 mg/dL — ABNORMAL HIGH (ref 0.44–1.00)
GFR, Estimated: 28 mL/min — ABNORMAL LOW (ref >60)
Glucose, Bld: 103 mg/dL — ABNORMAL HIGH (ref 70–99)
Potassium: 4.4 mmol/L (ref 3.5–5.1)
Sodium: 144 mmol/L (ref 135–145)

## 2024-08-20 NOTE — Plan of Care (Signed)

## 2024-08-20 NOTE — Progress Notes (Signed)
 Progress Note   Patient: Becky Trevino FMW:[id number] DOB: 1946/04/13 DOA: 08/17/2024     2 DOS: the patient was seen and examined on 08/20/2024   Brief hospital course: 78 y.o. female with medical history significant for paroxysmal atrial fibrillation, HTN, SVT, pulmonary HTN, CKD stage 3b, T2DM, chronic DVT/lymphedema on pump, wheelchair/walker dependent. 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  Patient, who is wheelchair/walker dependent presented for the second time in 2 days for evaluation of somnolence/weakness resulting in recent falls.  Workup on 12/2 showed a large scalp hematoma and AKI.[name]  She was hydrated and family opted to take her home.  Patient remained difficult to arouse at home, thus prompting the return visit to the ED.  She did not fall again..  She has had no recent fever or chills, vomiting or diarrhea or other complaints. In the ED, BP as low as 84/47, improving to 114/70 with hydration then again downtrending to 91/51 by admission.  Heart rate was in the 60s and other vitals within normal limits. Labs notable for new anemia of 9.8, down from baseline of 12.8 the day prior,Creatinine of 2.31 above baseline of 1.3 and up from the day prior of 1.73.  Potassium 5.3 with magnesium  2.9.  Baseline thrombocytopenia of 135,000. Troponin 38 Urinalysis without infection, respiratory viral panel negative for COVID flu and RSV. EKG showed sinus of 62 Chest x-ray nonacute CT head from the day prior (12/2) showed a large midline vertex scalp hematoma with no acute intracranial findings CT C-spine from the day prior was nonacute Patient treated with 2 L LR.  12/4.  Patient complains of back pain.  Patient with hypotension requiring fluid boluses.  Bladder scan showing empty bladder.  Patient had a drop in  hemoglobin with hemoglobin down to 8.7. 12/5.  Transfuse 1 unit of blood since hemoglobin down to 7.8.  Continue IV fluids.  Continue to hold Eliquis . 12/6.  Hemoglobin 8.4.  Will try to discontinue IV fluids since creatinine down to 1.84.  Continue midodrine  for hypotension.  Assessment and Plan: * Hypotension Continue midodrine .  Hold antihypertensive medications.  Try to get rid of IV fluids today and see what happens with blood pressure.  Acute renal failure superimposed on stage 3b chronic kidney disease (HCC) Creatinine 1.74 on presentation.  Creatinine peaked at 2.36.  Today's creatinine 1.84.  Baseline creatinine around 1.3.  Try to get rid of IV fluids today and see what happens.  Chronic anticoagulation Sonogram lower extremity shows chronic DVT.  Would like to check hemoglobin another day prior to restarting Eliquis .  ABLA (acute blood loss anemia) versus hemodilutional anemia Large scalp hematoma on head CT 08/16/2024 Chronic anticoagulation Hemoglobin down to 7.8 on 12/5 and given 1 unit of packed red blood cells.  Today's hemoglobin 8.4 but received fluids overnight after blood transfusion.  Will get rid of fluids and monitor hemoglobin.  Electrolyte disturbance Hyperkalemia and hypermagnesemia Potassium improved. Hypernatremia resolved  History of recurrent group B strep bacteremia Followed by ID, last seen 06/2024 Continue suppressive amoxicillin  twice daily Acute infection not suspected at this time  Acute metabolic encephalopathy Patient able to answer questions appropriately.  Likely secondary to low blood pressure and acute kidney injury.  Type 2 diabetes mellitus with diabetic neuropathy, without long-term current use of insulin  (HCC) Sliding scale insulin  coverage, hold home Farxiga .  Last hemoglobin A1c low  at 5.7.  AF (paroxysmal atrial fibrillation) (HCC) Paroxysmal in nature holding apixaban  pending further evaluation with head CT Continue  amiodarone  Will hold metoprolol  until blood pressure improves  Scalp hematoma secondary to fall, subsequent encounter Hold Eliquis   Obesity, Class III, BMI 40-49.9 (morbid obesity) (HCC) OSA BMI 40.85  Falls, subsequent encounter Wheelchair/walker dependent Chronic lymphedema on lymphedema pumps PT and OT recommending rehab but family would like to take home with home health        Subjective: Patient feeling better.  Offers no complaints.  No shortness of breath.  Patient on midodrine .  Creatinine improving.  Received 1 unit of packed red blood cells yesterday.  Admitted with hypotension.  Physical Exam: Vitals:   08/19/24 1944 08/19/24 2344 08/20/24 0402 08/20/24 0846  BP: (!) 94/48 (!) 106/51 (!) 97/47 (!) 94/43  Pulse: 62 68 68 68  Resp: 16 18 18 14   Temp: 98.7 F (37.1 C) 98.3 F (36.8 C) 97.6 F (36.4 C) 98 F (36.7 C)  TempSrc:      SpO2: 99% 94% 97% 96%  Weight:      Height:       Physical Exam HENT:     Head: Normocephalic.  Eyes:     General: Lids are normal.  Cardiovascular:     Rate and Rhythm: Normal rate and regular rhythm.     Heart sounds: Normal heart sounds, S1 normal and S2 normal.  Pulmonary:     Breath sounds: No decreased breath sounds, wheezing, rhonchi or rales.  Abdominal:     Palpations: Abdomen is soft.     Tenderness: There is no abdominal tenderness.  Musculoskeletal:     Right lower leg: Swelling present.     Left lower leg: Swelling present.  Skin:    General: Skin is warm.     Comments: Hematoma on back of the left calf.  Bruising behind left leg.  Neurological:     Mental Status: She is alert.     Data Reviewed: Platelet count 121, hemoglobin 8.4, white blood cell count 9.1, creatinine 1.84, sodium 144  Family Communication: Husband was on the phone with patient while I was in the room talking with the patient.  Left message for niece.  Disposition: Status is: Inpatient Remains inpatient appropriate because: Blood  pressure still on the lower side on midodrine .  Will try to discontinue fluids.  Continue to monitor creatinine.  Continue to watch hemoglobin.  Planned Discharge Destination: Home with Home Health.  Physical therapy recommending rehab but family will take home with home health.    Time spent: 28 minutes  Author: Charlie Patterson, MD 08/20/2024 12:06 PM  For on call review [email or web address].

## 2024-08-21 ENCOUNTER — Encounter: Payer: Self-pay | Admitting: Internal Medicine

## 2024-08-21 DIAGNOSIS — I959 Hypotension, unspecified: Secondary | ICD-10-CM

## 2024-08-21 DIAGNOSIS — N179 Acute kidney failure, unspecified: Secondary | ICD-10-CM | POA: Diagnosis not present

## 2024-08-21 DIAGNOSIS — D62 Acute posthemorrhagic anemia: Secondary | ICD-10-CM | POA: Diagnosis not present

## 2024-08-21 DIAGNOSIS — Z7901 Long term (current) use of anticoagulants: Secondary | ICD-10-CM | POA: Diagnosis not present

## 2024-08-21 LAB — BASIC METABOLIC PANEL WITH GFR
Anion gap: 10 (ref 5–15)
BUN: 26 mg/dL — ABNORMAL HIGH (ref 8–23)
CO2: 22 mmol/L (ref 22–32)
Calcium: 8.4 mg/dL — ABNORMAL LOW (ref 8.9–10.3)
Chloride: 110 mmol/L (ref 98–111)
Creatinine, Ser: 1.6 mg/dL — ABNORMAL HIGH (ref 0.44–1.00)
GFR, Estimated: 33 mL/min — ABNORMAL LOW (ref >60)
Glucose, Bld: 101 mg/dL — ABNORMAL HIGH (ref 70–99)
Potassium: 4 mmol/L (ref 3.5–5.1)
Sodium: 142 mmol/L (ref 135–145)

## 2024-08-21 LAB — HEMOGLOBIN: Hemoglobin: 8.7 g/dL — ABNORMAL LOW (ref 12.0–15.0)

## 2024-08-21 MED ORDER — PANTOPRAZOLE SODIUM 40 MG PO TBEC
40.0000 mg | DELAYED_RELEASE_TABLET | Freq: Two times a day (BID) | ORAL | Status: DC
  Administered 2024-08-21 – 2024-08-24 (×6): 40 mg via ORAL
  Filled 2024-08-21 (×6): qty 1

## 2024-08-21 MED ORDER — APIXABAN 5 MG PO TABS
5.0000 mg | ORAL_TABLET | Freq: Two times a day (BID) | ORAL | Status: DC
  Administered 2024-08-21 – 2024-08-24 (×6): 5 mg via ORAL
  Filled 2024-08-21 (×6): qty 1

## 2024-08-21 MED ORDER — IRON SUCROSE 300 MG IVPB - SIMPLE MED
300.0000 mg | Freq: Once | Status: AC
  Administered 2024-08-21: 300 mg via INTRAVENOUS
  Filled 2024-08-21: qty 300

## 2024-08-21 NOTE — Plan of Care (Signed)

## 2024-08-21 NOTE — Progress Notes (Signed)
 Progress Note   Patient: Becky Trevino FMW:[id number] DOB: Jul 19, 1946 DOA: 08/17/2024     3 DOS: the patient was seen and examined on 08/21/2024   Brief hospital course: 78 y.o. female with medical history significant for paroxysmal atrial fibrillation, HTN, SVT, pulmonary HTN, CKD stage 3b, T2DM, chronic DVT/lymphedema on pump, wheelchair/walker dependent. 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  Patient, who is wheelchair/walker dependent presented for the second time in 2 days for evaluation of somnolence/weakness resulting in recent falls.  Workup on 12/2 showed a large scalp hematoma and AKI.[name]  She was hydrated and family opted to take her home.  Patient remained difficult to arouse at home, thus prompting the return visit to the ED.  She did not fall again..  She has had no recent fever or chills, vomiting or diarrhea or other complaints. In the ED, BP as low as 84/47, improving to 114/70 with hydration then again downtrending to 91/51 by admission.  Heart rate was in the 60s and other vitals within normal limits. Labs notable for new anemia of 9.8, down from baseline of 12.8 the day prior,Creatinine of 2.31 above baseline of 1.3 and up from the day prior of 1.73.  Potassium 5.3 with magnesium  2.9.  Baseline thrombocytopenia of 135,000. Troponin 38 Urinalysis without infection, respiratory viral panel negative for COVID flu and RSV. EKG showed sinus of 62 Chest x-ray nonacute CT head from the day prior (12/2) showed a large midline vertex scalp hematoma with no acute intracranial findings CT C-spine from the day prior was nonacute Patient treated with 2 L LR.  12/4.  Patient complains of back pain.  Patient with hypotension requiring fluid boluses.  Bladder scan showing empty bladder.  Patient had a drop in  hemoglobin with hemoglobin down to 8.7. 12/5.  Transfuse 1 unit of blood since hemoglobin down to 7.8.  Continue IV fluids.  Continue to hold Eliquis . 12/6.  Hemoglobin 8.4.  Will try to discontinue IV fluids since creatinine down to 1.84.  Continue midodrine  for hypotension. 12/7.  Hemoglobin 8.7.  Try to restart Eliquis  this evening.  Creatinine down to 1.6.  Assessment and Plan: * Hypotension Continue midodrine .  Hold antihypertensive medications.  Blood pressure holding off medications.  Acute renal failure superimposed on stage 3b chronic kidney disease (HCC) Creatinine 1.74 on presentation.  Creatinine peaked at 2.36.  Today's creatinine 1.6.  Baseline creatinine around 1.3.  Discontinued IV fluids on 12/6.  Chronic anticoagulation Sonogram lower extremity shows chronic DVT.  Will restart Eliquis  this evening.  ABLA (acute blood loss anemia) versus hemodilutional anemia Large scalp hematoma on head CT 08/16/2024 Chronic anticoagulation Hemoglobin down to 7.8 on 12/5 and given 1 unit of packed red blood cells.  Today's hemoglobin 8.7.  Will restart Eliquis  this evening and monitor hemoglobin.  Electrolyte disturbance Hyperkalemia and hypermagnesemia Potassium improved. Hypernatremia resolved  History of recurrent group B strep bacteremia Followed by ID, last seen 06/2024 Continue suppressive amoxicillin  twice daily Acute infection not suspected at this time  Acute metabolic encephalopathy Improved.  Likely secondary to low blood pressure and acute kidney injury.  Type 2 diabetes mellitus with diabetic neuropathy, without long-term current use of insulin  (HCC) Sliding scale insulin  coverage, hold home Farxiga .  Last hemoglobin A1c low at 5.7.  AF (paroxysmal atrial fibrillation) (HCC) Paroxysmal in nature. Continue amiodarone  Will hold metoprolol  until blood pressure  Will restart Eliquis  this evening  Scalp hematoma secondary to fall, subsequent encounter Will restart  Eliquis .  Obesity, Class III, BMI 40-49.9 (morbid obesity) (HCC) OSA BMI 40.85 (with the only weight in the computer)  Falls, subsequent encounter Wheelchair/walker dependent Chronic lymphedema on lymphedema pumps PT and OT recommending rehab but family would like to take home with home health        Subjective: Patient feeling okay.  Patient states he is eating and drinking and urinating okay.  Patient offers no complaints.  Physical Exam: Vitals:   08/20/24 1639 08/20/24 2034 08/21/24 0543 08/21/24 0837  BP: (!) 112/55 (!) 115/57 (!) 121/53 (!) 105/47  Pulse: 72 71 73 73  Resp: 18 18 16 19   Temp: (!) 97.5 F (36.4 C) 98.3 F (36.8 C) 98.7 F (37.1 C) 98.4 F (36.9 C)  TempSrc: Oral     SpO2: 94% 94% 92% 93%  Weight:      Height:       Physical Exam HENT:     Head: Normocephalic.  Eyes:     General: Lids are normal.  Cardiovascular:     Rate and Rhythm: Normal rate and regular rhythm.     Heart sounds: Normal heart sounds, S1 normal and S2 normal.  Pulmonary:     Breath sounds: No decreased breath sounds, wheezing, rhonchi or rales.  Abdominal:     Palpations: Abdomen is soft.     Tenderness: There is no abdominal tenderness.  Musculoskeletal:     Right lower leg: Swelling present.     Left lower leg: Swelling present.  Skin:    General: Skin is warm.     Comments: Hematoma on back of the left calf.  Bruising behind left leg.  Neurological:     Mental Status: She is alert.     Data Reviewed: Creatinine down to 1.6, hemoglobin 8.7  Family Communication: Spoke with niece on the phone  Disposition: Status is: Inpatient Remains inpatient appropriate because: Continue midodrine  for low blood pressure.  Will give IV iron  today.  Try to restart Eliquis  this evening.  Monitor hemoglobin with restarting Eliquis .  Planned Discharge Destination: Home with Home Health    Time spent: 28 minutes  Author: Charlie Patterson, MD 08/21/2024 12:35 PM  For on call  review [email or web address].

## 2024-08-21 NOTE — Care Management Important Message (Signed)
 Important Message  Patient Details  Name: Becky Trevino MRN: [id number] Date of Birth: Oct 01, 1945   Important Message Given:  Yes - Medicare IM     Baker Moronta W, CMA 08/21/2024, 8:19 PM

## 2024-08-21 NOTE — TOC Progression Note (Addendum)
 Transition of Care [facility]) - Progression Note    Patient Details  Name: Becky Trevino MRN: [id number] Date of Birth: 11/28/1945  Transition of Care [facility]) CM/SW Contact  Breanne Olvera L Haywood [place], [place] Phone Number: 08/21/2024, 3:59 PM  Clinical Narrative:     CSW spoke with daughter [name], and provided bed offers. CSW advised that [facility] and [facility] have offered beds. [name] declined both offers and advised that she would consider [facility]. [facility] has not offered a bed.   [name] advised that she would like [facility] added to the list. CSW added [facility]. [name] asked CSW to contact [name], patients niece to go over bed offers. CSW attempted to call to no avail. CSW left a voicemail requesting a return call.   5:22pm: Call received from [name], Niece and [facility]CP[facility]. [name] was provided bed offers. [name] chose [facility]. She stated that patients brother is there. She stated that the family really wanted [facility] because patient spouse is there but there is no bed availability. [facility] selected in the portal.   Expected Discharge Plan: Home w Home Health Services Barriers to Discharge: Continued Medical Work up               Expected Discharge Plan and Services     Post Acute Care Choice: Home Health, Durable Medical Equipment Living arrangements for the past 2 months: Single Family Home                                       Social Drivers of Health (SDOH) Interventions SDOH Screenings   Food Insecurity: Patient Declined (08/18/2024)  Housing: Patient Declined (08/18/2024)  Transportation Needs: Patient Declined (08/18/2024)  Utilities: Patient Declined (08/18/2024)  Alcohol Screen: Low Risk  (08/06/2023)  Depression (PHQ2-9): Low Risk  (07/21/2024)  Financial Resource Strain: Low Risk  (08/15/2024)   Received from [facility]  Physical Activity: Insufficiently Active  (08/06/2023)  Social Connections: Patient Declined (08/18/2024)  Stress: No Stress Concern Present (08/06/2023)  Tobacco Use: Low Risk  (08/21/2024)  Health Literacy: Adequate Health Literacy (08/06/2023)    Readmission Risk Interventions     No data to display

## 2024-08-22 LAB — BASIC METABOLIC PANEL WITH GFR
Anion gap: 10 (ref 5–15)
BUN: 22 mg/dL (ref 8–23)
CO2: 22 mmol/L (ref 22–32)
Calcium: 8.4 mg/dL — ABNORMAL LOW (ref 8.9–10.3)
Chloride: 108 mmol/L (ref 98–111)
Creatinine, Ser: 1.44 mg/dL — ABNORMAL HIGH (ref 0.44–1.00)
GFR, Estimated: 37 mL/min — ABNORMAL LOW (ref >60)
Glucose, Bld: 84 mg/dL (ref 70–99)
Potassium: 3.8 mmol/L (ref 3.5–5.1)
Sodium: 140 mmol/L (ref 135–145)

## 2024-08-22 LAB — HEMOGLOBIN: Hemoglobin: 8.3 g/dL — ABNORMAL LOW (ref 12.0–15.0)

## 2024-08-22 NOTE — TOC Progression Note (Signed)
 Transition of Care [facility]) - Progression Note    Patient Details  Name: Becky Trevino MRN: [id number] Date of Birth: 1946/07/13  Transition of Care [facility]) CM/SW Contact  Dalia GORMAN Fuse, RN Phone Number: 08/22/2024, 9:09 AM  Clinical Narrative:     Creatnine 1.44, trending down. HGB 8.4 down from 8.7 yesterday. started eliquis  for lower extremity DVTs. Will start ins auth for [facility] when the patient is medically ready for discharge.   Expected Discharge Plan: Home w Home Health Services Barriers to Discharge: Continued Medical Work up               Expected Discharge Plan and Services     Post Acute Care Choice: Home Health, Durable Medical Equipment Living arrangements for the past 2 months: Single Family Home                                       Social Drivers of Health (SDOH) Interventions SDOH Screenings   Food Insecurity: Patient Declined (08/18/2024)  Housing: Patient Declined (08/18/2024)  Transportation Needs: Patient Declined (08/18/2024)  Utilities: Patient Declined (08/18/2024)  Alcohol Screen: Low Risk  (08/06/2023)  Depression (PHQ2-9): Low Risk  (07/21/2024)  Financial Resource Strain: Low Risk  (08/15/2024)   Received from [facility]  Physical Activity: Insufficiently Active (08/06/2023)  Social Connections: Patient Declined (08/18/2024)  Stress: No Stress Concern Present (08/06/2023)  Tobacco Use: Low Risk  (08/21/2024)  Health Literacy: Adequate Health Literacy (08/06/2023)    Readmission Risk Interventions     No data to display

## 2024-08-22 NOTE — Progress Notes (Signed)
 Progress Note   Patient: Becky Trevino FMW:[id number] DOB: 25-Oct-1945 DOA: 08/17/2024     4 DOS: the patient was seen and examined on 08/22/2024   Brief hospital course: 78 y.o. female with medical history significant for paroxysmal atrial fibrillation, HTN, SVT, pulmonary HTN, CKD stage 3b, T2DM, chronic DVT/lymphedema on pump, wheelchair/walker dependent. 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  Patient, who is wheelchair/walker dependent presented for the second time in 2 days for evaluation of somnolence/weakness resulting in recent falls.  Workup on 12/2 showed a large scalp hematoma and AKI.[name]  She was hydrated and family opted to take her home.  Patient remained difficult to arouse at home, thus prompting the return visit to the ED.  She did not fall again..  She has had no recent fever or chills, vomiting or diarrhea or other complaints. In the ED, BP as low as 84/47, improving to 114/70 with hydration then again downtrending to 91/51 by admission.  Heart rate was in the 60s and other vitals within normal limits. Labs notable for new anemia of 9.8, down from baseline of 12.8 the day prior,Creatinine of 2.31 above baseline of 1.3 and up from the day prior of 1.73.  Potassium 5.3 with magnesium  2.9.  Baseline thrombocytopenia of 135,000. Troponin 38 Urinalysis without infection, respiratory viral panel negative for COVID flu and RSV. EKG showed sinus of 62 Chest x-ray nonacute CT head from the day prior (12/2) showed a large midline vertex scalp hematoma with no acute intracranial findings CT C-spine from the day prior was nonacute Patient treated with 2 L LR.  12/4.  Patient complains of back pain.  Patient with hypotension requiring fluid boluses.  Bladder scan showing empty bladder.  Patient had a drop in  hemoglobin with hemoglobin down to 8.7. 12/5.  Transfuse 1 unit of blood since hemoglobin down to 7.8.  Continue IV fluids.  Continue to hold Eliquis . 12/6.  Hemoglobin 8.4.  Will try to discontinue IV fluids since creatinine down to 1.84.  Continue midodrine  for hypotension. 12/7.  Hemoglobin 8.7.  Try to restart Eliquis  this evening.  Creatinine down to 1.6.  IV Venofer  given. 12/8.  Hemoglobin 8.3.  Creatinine down to 1.44.  Blood pressure holding on midodrine .  Assessment and Plan: * Hypotension Continue midodrine .  Hold antihypertensive medications.  Blood pressure stable on midodrine .  Acute renal failure superimposed on stage 3b chronic kidney disease (HCC) Creatinine 1.74 on presentation.  Creatinine peaked at 2.36.  Today's creatinine 1.44.  Baseline creatinine around 1.3.  Discontinued IV fluids on 12/6.  Chronic anticoagulation Sonogram lower extremity shows chronic DVT.  Restarted Eliquis  in the evening of 12/7.  ABLA (acute blood loss anemia) versus hemodilutional anemia Large scalp hematoma on head CT 08/16/2024 Chronic anticoagulation Hemoglobin down to 7.8 on 12/5 and given 1 unit of packed red blood cells.  Given IV Venofer  on 12/7.  Today's hemoglobin 8.3.  Will restart Eliquis  this evening and monitor hemoglobin.  Electrolyte disturbance Hyperkalemia and hypermagnesemia Potassium improved. Hypernatremia resolved  History of recurrent group B strep bacteremia Followed by ID, last seen 06/2024 Continue suppressive amoxicillin  twice daily Acute infection not suspected at this time  Acute metabolic encephalopathy Improved.  Likely secondary to low blood pressure and acute kidney injury.  Type 2 diabetes mellitus with diabetic neuropathy, without long-term current use of insulin  (HCC) Sliding scale insulin  coverage,  hold home Farxiga .  Last hemoglobin A1c low at 5.7.  AF (paroxysmal atrial fibrillation) (HCC) Paroxysmal in nature. Continue amiodarone  Will hold  metoprolol . Restart Eliquis  on 12/7.  Scalp hematoma secondary to fall, subsequent encounter Continue Eliquis .  Obesity, Class III, BMI 40-49.9 (morbid obesity) (HCC) OSA BMI 43.05.  Falls, subsequent encounter Wheelchair/walker dependent Chronic lymphedema on lymphedema pumps PT/OT recommending rehab.  Patient changed her mind and would like to go out to rehab.        Subjective: Patient feeling okay.  Offers no complaints.  Patient has changed her mind about rehab.  Transitional care team looking into rehab options.  Physical Exam: Vitals:   08/21/24 2122 08/22/24 0500 08/22/24 0528 08/22/24 0752  BP: (!) 105/55  (!) 102/55 (!) 110/42  Pulse: 75  75 74  Resp: 18  18 17   Temp: (!) 97.5 F (36.4 C)  98.6 F (37 C) (!) 97.4 F (36.3 C)  TempSrc: Oral  Oral Oral  SpO2: 99%  96% 92%  Weight:  (!) 140 kg    Height:       Physical Exam HENT:     Head: Normocephalic.  Eyes:     General: Lids are normal.  Cardiovascular:     Rate and Rhythm: Normal rate and regular rhythm.     Heart sounds: Normal heart sounds, S1 normal and S2 normal.  Pulmonary:     Breath sounds: No decreased breath sounds, wheezing, rhonchi or rales.  Abdominal:     Palpations: Abdomen is soft.     Tenderness: There is no abdominal tenderness.  Musculoskeletal:     Right lower leg: Swelling present.     Left lower leg: Swelling present.  Skin:    General: Skin is warm.     Comments: Hematoma on back of the left calf.  Bruising behind left leg.  Neurological:     Mental Status: She is alert.     Data Reviewed: Creatinine 1.44, electrolytes normal range, hemoglobin 8.3  Family Communication: Spoke with niece on the phone  Disposition: Status is: Inpatient Remains inpatient appropriate because: Transitional care team to look into rehab options and will need insurance authorization  Planned Discharge Destination: Rehab    Time spent: 27 minutes  Author: Charlie Patterson,  MD 08/22/2024 11:57 AM  For on call review [email or web address].

## 2024-08-22 NOTE — Progress Notes (Signed)
 Physical Therapy Treatment Patient Details Name: Becky Trevino MRN: [id number] DOB: February 15, 1946 Today's Date: 08/22/2024   History of Present Illness 78 y/o female presented to ED on 08/17/24 for AMS, hypotension and AKI in setting of recent falls. CT showed large midline vertex scalp hematoma. Admitted for acute renal failure and hypotension as well as acute metabolic encephalopathy. PMH: HTN, T2DM, wheelchair dependent, anticoagulation, Afib, obesity, chronic lymphedema    PT Comments  Co-tx with OT for improved outcomes.  She is able to get to EOB with mod a x 2 and bed features.  Time spent with sitting balance activities and BLE AROM.  Unable to laterally or forward scoot without mod/max a x 2.  She is able to stand x 2 from elevated bed with mod a x 2 and +1 to lower bed once up to ensure safe sitting back on bed.  She stands about 15-20 seconds each attempt.  Small BM noted.  Attemtped 3rd stand for care but she is unable and is returned to supine with mod/max a x 1 to roll for care and new linens.  Pt overall does quite well with mobility today.  Remains motivated and puts in max effort for mobility.  < 3 hrs a day therapy remains appropriate for discharge.   If plan is discharge home, recommend the following: Two people to help with walking and/or transfers;Two people to help with bathing/dressing/bathroom;Assistance with cooking/housework;Assist for transportation;Help with stairs or ramp for entrance;Direct supervision/assist for medications management;Direct supervision/assist for financial management;Supervision due to cognitive status   Can travel by private vehicle        Equipment Recommendations       Recommendations for Other Services       Precautions / Restrictions Precautions Precautions: Fall Recall of Precautions/Restrictions: Impaired Restrictions Weight Bearing Restrictions Per Provider Order: No     Mobility  Bed Mobility Overal bed mobility: Needs  Assistance Bed Mobility: Supine to Sit, Sit to Supine     Supine to sit: Mod assist, +2 for physical assistance, HOB elevated, Used rails Sit to supine: Mod assist, +2 for physical assistance   General bed mobility comments: +3 for comfort and safety to return to supine and pull up in bed. Patient Response: Cooperative  Transfers Overall transfer level: Needs assistance Equipment used: Rolling walker (2 wheels) Transfers: Sit to/from Stand Sit to Stand: From elevated surface, Mod assist, +2 physical assistance, +2 safety/equipment           General transfer comment: stood x 2 with +2 mod and +1 lower bed for safety once standing.    Ambulation/Gait               General Gait Details: Counsellor     Tilt Bed Tilt Bed Patient Response: Cooperative  Modified Rankin (Stroke Patients Only)       Balance Overall balance assessment: Needs assistance Sitting-balance support: No upper extremity supported, Feet supported Sitting balance-Leahy Scale: Good     Standing balance support: Bilateral upper extremity supported Standing balance-Leahy Scale: Poor Standing balance comment: heavy reliance on RW and staff assist.  forward lean on walker                            Communication Communication Communication: No apparent difficulties  Cognition Arousal: Alert Behavior During Therapy: WFL for tasks assessed/performed   PT -  Cognitive impairments: No family/caregiver present to determine baseline                         Following commands: Intact Following commands impaired: Only follows one step commands consistently    Cueing Cueing Techniques: Verbal cues, Gestural cues, Tactile cues  Exercises Other Exercises Other Exercises: seated AROM BLE and sitting balance activities    General Comments        Pertinent Vitals/Pain Pain Assessment Pain Assessment: No/denies pain Pain  Intervention(s): Monitored during session    Home Living                          Prior Function            PT Goals (current goals can now be found in the care plan section) Progress towards PT goals: Progressing toward goals    Frequency    Min 1X/week      PT Plan      Co-evaluation PT/OT/SLP Co-Evaluation/Treatment: Yes Reason for Co-Treatment: For patient/therapist safety;To address functional/ADL transfers PT goals addressed during session: Mobility/safety with mobility;Strengthening/ROM OT goals addressed during session: ADL's and self-care      AM-PAC PT 6 Clicks Mobility   Outcome Measure  Help needed turning from your back to your side while in a flat bed without using bedrails?: A Lot Help needed moving from lying on your back to sitting on the side of a flat bed without using bedrails?: A Lot Help needed moving to and from a bed to a chair (including a wheelchair)?: Total Help needed standing up from a chair using your arms (e.g., wheelchair or bedside chair)?: Total Help needed to walk in hospital room?: Total Help needed climbing 3-5 steps with a railing? : Total 6 Click Score: 8    End of Session Equipment Utilized During Treatment: Gait belt Activity Tolerance: Patient tolerated treatment well Patient left: in bed;with bed alarm set;with call bell/phone within reach Nurse Communication: Mobility status PT Visit Diagnosis: Muscle weakness (generalized) (M62.81);Other abnormalities of gait and mobility (R26.89)     Time: 9091-9068 PT Time Calculation (min) (ACUTE ONLY): 23 min  Charges:    $Therapeutic Activity: 8-22 mins PT General Charges $$ ACUTE PT VISIT: 1 Visit                   Lauraine Gills, PTA 08/22/24, 9:40 AM

## 2024-08-22 NOTE — Progress Notes (Signed)
 Occupational Therapy Treatment Patient Details Name: KIMBLE DELAURENTIS MRN: [id number] DOB: [birth year] Today's Date: 08/22/2024   History of present illness 78 y/o female presented to ED on 08/17/24 for AMS, hypotension and AKI in setting of recent falls. CT showed large midline vertex scalp hematoma. Admitted for acute renal failure and hypotension as well as acute metabolic encephalopathy. PMH: HTN, T2DM, wheelchair dependent, anticoagulation, Afib, obesity, chronic lymphedema   OT comments  Pt is supine in bed on arrival. Pleasant and agreeable to OT/PT co-treatment session. She denies pain. Pt performed bed mobility and 2 standing bouts from EOB requiring Mod A x2 this date with cues for hand placement, feet placement and cues for initiation. Bed height siginificantly elevated for standing bouts and limited standing tolerance to ~30 seconds requiring heavy UE support on RW and Max A x2 as she fatigued. Unable to stand on 3rd attempt d/t fatigue, however noted with incont BM requiring clean up at bed level. 3rd assist in to lowed bed for safety and assist with scooting pt up to [facility]. Total assist for bed level peri-care. Pt returned to bed with all needs in place and will cont to require skilled acute OT services to maximize her safety and IND to return to PLOF.       If plan is discharge home, recommend the following:  Two people to help with bathing/dressing/bathroom;Two people to help with walking and/or transfers;Direct supervision/assist for medications management;Direct supervision/assist for financial management;Assist for transportation;Help with stairs or ramp for entrance;Supervision due to cognitive status   Equipment Recommendations  Other (comment) (defer to next venue)    Recommendations for Other Services      Precautions / Restrictions Precautions Precautions: Fall Recall of Precautions/Restrictions: Impaired Restrictions Weight Bearing Restrictions Per Provider Order: No        Mobility Bed Mobility Overal bed mobility: Needs Assistance Bed Mobility: Supine to Sit, Sit to Supine     Supine to sit: Mod assist, +2 for physical assistance, HOB elevated, Used rails Sit to supine: Mod assist, +2 for physical assistance   General bed mobility comments: cues for hand placement and initiation, increased use of chux pad to reach EOB but able to assist with trunkal elevation; +3 utilized for safe return to supine and scooting up in bed    Transfers Overall transfer level: Needs assistance Equipment used: Rolling walker (2 wheels) Transfers: Sit to/from Stand Sit to Stand: From elevated surface, Mod assist, +2 physical assistance, +2 safety/equipment           General transfer comment: 2 standing bouts performed with 3rd attempted; bed height elevated significantly lift off assist required Mod A X2 with standing time ~30 seconds max; additional +1 from NT to lower bed while pt standing with fatigue requires Max A x2 to maintain standing     Balance Overall balance assessment: Needs assistance Sitting-balance support: No upper extremity supported, Feet supported Sitting balance-Leahy Scale: Good Sitting balance - Comments: initially min A progressing to CGA   Standing balance support: Bilateral upper extremity supported, Reliant on assistive device for balance Standing balance-Leahy Scale: Poor Standing balance comment: +2 external support and heavy UE support on RW                           ADL either performed or assessed with clinical judgement   ADL Overall ADL's : Needs assistance/impaired     Grooming: Wash/dry face;Sitting;Supervision/safety Grooming Details (indicate cue type and reason): seated  EOB                     Toileting- Clothing Manipulation and Hygiene: Total assistance;Bed level Toileting - Clothing Manipulation Details (indicate cue type and reason): rolling to bil sides in bed             Extremity/Trunk Assessment              Vision       Perception     Praxis     Communication Communication Communication: No apparent difficulties   Cognition Arousal: Alert Behavior During Therapy: WFL for tasks assessed/performed                                 Following commands: Intact Following commands impaired: Only follows one step commands consistently      Cueing   Cueing Techniques: Verbal cues, Gestural cues, Tactile cues  Exercises      Shoulder Instructions       General Comments      Pertinent Vitals/ Pain       Pain Assessment Pain Assessment: No/denies pain Pain Intervention(s): Monitored during session, Repositioned  Home Living                                          Prior Functioning/Environment              Frequency  Min 2X/week        Progress Toward Goals  OT Goals(current goals can now be found in the care plan section)  Progress towards OT goals: Progressing toward goals  Acute Rehab OT Goals Patient Stated Goal: get stronger OT Goal Formulation: With patient Time For Goal Achievement: 09/01/24 Potential to Achieve Goals: Fair  Plan      Co-evaluation    PT/OT/SLP Co-Evaluation/Treatment: Yes Reason for Co-Treatment: For patient/therapist safety;To address functional/ADL transfers PT goals addressed during session: Mobility/safety with mobility;Strengthening/ROM OT goals addressed during session: ADL's and self-care      AM-PAC OT 6 Clicks Daily Activity     Outcome Measure   Help from another person eating meals?: A Little Help from another person taking care of personal grooming?: A Little Help from another person toileting, which includes using toliet, bedpan, or urinal?: Total Help from another person bathing (including washing, rinsing, drying)?: Total Help from another person to put on and taking off regular upper body clothing?: A Lot Help from another person  to put on and taking off regular lower body clothing?: Total 6 Click Score: 11    End of Session Equipment Utilized During Treatment: Gait belt;Rolling walker (2 wheels)  OT Visit Diagnosis: Muscle weakness (generalized) (M62.81)   Activity Tolerance Patient tolerated treatment well   Patient Left in bed;with call bell/phone within reach;with bed alarm set   Nurse Communication Mobility status        Time: 9090-9068 OT Time Calculation (min): 22 min  Charges: OT General Charges $OT Visit: 1 Visit OT Treatments $Self Care/Home Management : 8-22 mins  Shylyn Younce Chrismon, OTR/L  08/22/24, 11:52 AM   Erick Murin E Chrismon 08/22/2024, 11:49 AM

## 2024-08-22 NOTE — Plan of Care (Signed)
  Problem: Health Behavior/Discharge Planning: Goal: Ability to manage health-related needs will improve Outcome: Progressing   Problem: Clinical Measurements: Goal: Ability to maintain clinical measurements within normal limits will improve Outcome: Progressing Goal: Will remain free from infection Outcome: Progressing Goal: Respiratory complications will improve Outcome: Progressing   Problem: Activity: Goal: Risk for activity intolerance will decrease Outcome: Progressing   

## 2024-08-22 NOTE — Plan of Care (Signed)

## 2024-08-23 LAB — BASIC METABOLIC PANEL WITH GFR
Anion gap: 9 (ref 5–15)
BUN: 18 mg/dL (ref 8–23)
CO2: 22 mmol/L (ref 22–32)
Calcium: 8.1 mg/dL — ABNORMAL LOW (ref 8.9–10.3)
Chloride: 107 mmol/L (ref 98–111)
Creatinine, Ser: 1.27 mg/dL — ABNORMAL HIGH (ref 0.44–1.00)
GFR, Estimated: 43 mL/min — ABNORMAL LOW (ref >60)
Glucose, Bld: 83 mg/dL (ref 70–99)
Potassium: 3.5 mmol/L (ref 3.5–5.1)
Sodium: 138 mmol/L (ref 135–145)

## 2024-08-23 LAB — TYPE AND SCREEN
ABO/RH(D): A POS
ABO/RH(D): A POS
Antibody Screen: NEGATIVE
Antibody Screen: NEGATIVE
Unit division: 0
Unit division: 0

## 2024-08-23 LAB — BPAM RBC
Blood Product Expiration Date: 202512122359
Blood Product Expiration Date: 202601052359
ISSUE DATE / TIME: 202512051106
ISSUE DATE / TIME: 202512091105
Unit Type and Rh: 202512122359
Unit Type and Rh: 600
Unit Type and Rh: 6200

## 2024-08-23 LAB — HEMOGLOBIN
Hemoglobin: 7.7 g/dL — ABNORMAL LOW (ref 12.0–15.0)
Hemoglobin: 8.3 g/dL — ABNORMAL LOW (ref 12.0–15.0)

## 2024-08-23 MED ORDER — ACETAMINOPHEN 325 MG PO TABS: 650.0000 mg | ORAL_TABLET | Freq: Four times a day (QID) | ORAL | Status: AC | PRN

## 2024-08-23 MED ORDER — MIDODRINE HCL 10 MG PO TABS: 10.0000 mg | ORAL_TABLET | Freq: Three times a day (TID) | ORAL | 0 refills | Status: AC

## 2024-08-23 MED ORDER — PANTOPRAZOLE SODIUM 40 MG PO TBEC: 40.0000 mg | DELAYED_RELEASE_TABLET | Freq: Two times a day (BID) | ORAL | 0 refills | Status: AC

## 2024-08-23 MED ORDER — TORSEMIDE 20 MG PO TABS
20.0000 mg | ORAL_TABLET | Freq: Every day | ORAL | Status: DC
  Administered 2024-08-24: 20 mg via ORAL
  Filled 2024-08-23: qty 1

## 2024-08-23 MED ORDER — PREGABALIN 50 MG PO CAPS
50.0000 mg | ORAL_CAPSULE | Freq: Every evening | ORAL | Status: DC
  Administered 2024-08-23: 50 mg via ORAL
  Filled 2024-08-23: qty 1

## 2024-08-23 MED ORDER — FUROSEMIDE 10 MG/ML IJ SOLN
40.0000 mg | Freq: Once | INTRAMUSCULAR | Status: AC
  Administered 2024-08-23: 40 mg via INTRAVENOUS
  Filled 2024-08-23: qty 4

## 2024-08-23 MED ORDER — OXYCODONE HCL 5 MG PO TABS: 5.0000 mg | ORAL_TABLET | Freq: Four times a day (QID) | ORAL | 0 refills | Status: AC | PRN

## 2024-08-23 MED ORDER — PREGABALIN 50 MG PO CAPS: 50.0000 mg | ORAL_CAPSULE | Freq: Every evening | ORAL | 0 refills | Status: AC

## 2024-08-23 MED ORDER — POTASSIUM CHLORIDE CRYS ER 10 MEQ PO TBCR
10.0000 meq | EXTENDED_RELEASE_TABLET | Freq: Every day | ORAL | Status: DC
  Administered 2024-08-23: 10 meq via ORAL
  Filled 2024-08-23: qty 1

## 2024-08-23 MED ORDER — IRON SUCROSE 300 MG IVPB - SIMPLE MED
300.0000 mg | Freq: Once | Status: AC
  Administered 2024-08-23: 300 mg via INTRAVENOUS
  Filled 2024-08-23: qty 300

## 2024-08-23 MED ORDER — POTASSIUM CHLORIDE ER 10 MEQ PO TBCR: 10.0000 meq | EXTENDED_RELEASE_TABLET | Freq: Every day | ORAL | Status: DC

## 2024-08-23 MED ORDER — POLYETHYLENE GLYCOL 3350 17 G PO PACK: 17.0000 g | PACK | ORAL | Status: AC

## 2024-08-23 NOTE — Progress Notes (Signed)
 Report called to [employer]. RN spoke with Jamee LPN at [phone]

## 2024-08-23 NOTE — Progress Notes (Signed)
 TOC notified me that facility cannot take her secondary to her weight.  Unfortunately will not be able to go out to rehab tonight.  Dr. Charlie Patterson

## 2024-08-23 NOTE — TOC Progression Note (Signed)
 Transition of Care [facility]) - Progression Note    Patient Details  Name: Becky Trevino MRN: [id number] Date of Birth: [birth year]  Transition of Care [facility]) CM/SW Contact  Dalia GORMAN Fuse, RN Phone Number: 08/23/2024, 4:54 PM  Clinical Narrative:    Plan for [facility] tomorrow. [facility] refused to admit the patient.    Expected Discharge Plan: Home w Home Health Services Barriers to Discharge: Continued Medical Work up               Expected Discharge Plan and Services     Post Acute Care Choice: Home Health, Durable Medical Equipment Living arrangements for the past 2 months: Single Family Home Expected Discharge Date: 08/23/24                                     Social Drivers of Health (SDOH) Interventions SDOH Screenings   Food Insecurity: Patient Declined (08/18/2024)  Housing: Patient Declined (08/18/2024)  Transportation Needs: Patient Declined (08/18/2024)  Utilities: Patient Declined (08/18/2024)  Alcohol Screen: Low Risk  (08/06/2023)  Depression (PHQ2-9): Low Risk  (07/21/2024)  Financial Resource Strain: Low Risk  (08/15/2024)   Received from [facility]  Physical Activity: Insufficiently Active (08/06/2023)  Social Connections: Patient Declined (08/18/2024)  Stress: No Stress Concern Present (08/06/2023)  Tobacco Use: Low Risk  (08/21/2024)  Health Literacy: Adequate Health Literacy (08/06/2023)    Readmission Risk Interventions     No data to display

## 2024-08-23 NOTE — Assessment & Plan Note (Signed)
 Continue CPAP with pressure of 10 cm at night.

## 2024-08-23 NOTE — Plan of Care (Signed)
  Problem: Clinical Measurements: ?Goal: Ability to maintain clinical measurements within normal limits will improve ?Outcome: Progressing ?Goal: Will remain free from infection ?Outcome: Progressing ?Goal: Diagnostic test results will improve ?Outcome: Progressing ?Goal: Respiratory complications will improve ?Outcome: Progressing ?  ?Problem: Activity: ?Goal: Risk for activity intolerance will decrease ?Outcome: Progressing ?  ?

## 2024-08-23 NOTE — Discharge Summary (Signed)
 Physician Discharge Summary   Patient: Becky Trevino MRN: [id number] DOB: September 24, 1945  Admit date:     08/17/2024  Discharge date: 08/23/24  Discharge Physician: Charlie Patterson   PCP: Sowles, Krichna, MD   Recommendations at discharge:   Follow-up team at rehab 1 day Check BMP and hemoglobin in about 5 days  Discharge Diagnoses: Principal Problem:   Hypotension Active Problems:   Acute renal failure superimposed on stage 3b chronic kidney disease (HCC)   ABLA (acute blood loss anemia) versus hemodilutional anemia   Chronic anticoagulation   History of recurrent group B strep bacteremia   Electrolyte disturbance   Scalp hematoma secondary to fall, subsequent encounter   AF (paroxysmal atrial fibrillation) (HCC)   Type 2 diabetes mellitus with diabetic neuropathy, without long-term current use of insulin  (HCC)   Acute metabolic encephalopathy   Falls, subsequent encounter   Obesity, Class III, BMI 40-49.9 (morbid obesity) (HCC)   OSA (obstructive sleep apnea)    Hospital Course: 78 y.o. female with medical history significant for paroxysmal atrial fibrillation, HTN, SVT, pulmonary HTN, CKD stage 3b, T2DM, chronic DVT/lymphedema on pump, wheelchair/walker dependent. 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  Patient, who is wheelchair/walker dependent presented for the second time in 2 days for evaluation of somnolence/weakness resulting in recent falls.  Workup on 12/2 showed a large scalp hematoma and AKI.[name]  She was hydrated and family opted to take her home.  Patient remained difficult to arouse at home, thus prompting the return visit to the ED.  She did not fall again..  She has had no recent fever or chills, vomiting or diarrhea or other complaints. In the ED, BP as low as 84/47, improving to  114/70 with hydration then again downtrending to 91/51 by admission.  Heart rate was in the 60s and other vitals within normal limits. Labs notable for new anemia of 9.8, down from baseline of 12.8 the day prior,Creatinine of 2.31 above baseline of 1.3 and up from the day prior of 1.73.  Potassium 5.3 with magnesium  2.9.  Baseline thrombocytopenia of 135,000. Troponin 38 Urinalysis without infection, respiratory viral panel negative for COVID flu and RSV. EKG showed sinus of 62 Chest x-ray nonacute CT head from the day prior (12/2) showed a large midline vertex scalp hematoma with no acute intracranial findings CT C-spine from the day prior was nonacute Patient treated with 2 L LR.  12/4.  Patient complains of back pain.  Patient with hypotension requiring fluid boluses.  Bladder scan showing empty bladder.  Patient had a drop in hemoglobin with hemoglobin down to 8.7. 12/5.  Transfuse 1 unit of blood since hemoglobin down to 7.8.  Continue IV fluids.  Continue to hold Eliquis . 12/6.  Hemoglobin 8.4.  Will try to discontinue IV fluids since creatinine down to 1.84.  Continue midodrine  for hypotension. 12/7.  Hemoglobin 8.7.  Try to restart Eliquis  this evening.  Creatinine down to 1.6.  IV Venofer  given. 12/8.  Hemoglobin 8.3.  Creatinine down to 1.44.  Blood pressure holding on midodrine . 12.9.  Hemoglobin down to 7.7.  IV iron  given again today.  Since creatinine down to 1.27 which is her baseline I gave a dose of IV Lasix .  Hemoglobin upon discharge 8.3.  Will discharge to facility.  Assessment and Plan: * Hypotension Continue midodrine  10 mg 3 times daily with meals.  Hold  antihypertensive medications.  Blood pressure stable on midodrine .  Acute renal failure superimposed on stage 3b chronic kidney disease (HCC) Creatinine 1.74 on presentation.  Creatinine peaked at 2.36.  Today's creatinine 1.27.  Baseline creatinine around 1.3.  Discontinued IV fluids on 12/6.  Chronic  anticoagulation Sonogram lower extremity shows chronic DVT.  Restarted Eliquis  in the evening of 12/7.  ABLA (acute blood loss anemia) versus hemodilutional anemia Large scalp hematoma on head CT 08/16/2024 Chronic anticoagulation Hemoglobin down to 7.8 on 12/5 and given 1 unit of packed red blood cells.  Given IV Venofer  on 12/7.  Will restart Eliquis  this evening and monitor hemoglobin.  Hemoglobin this morning 7.7.  IV Venofer  given again this morning.  Patient given a dose of IV Lasix  and repeat hemoglobin 8.3.  Electrolyte disturbance Hyperkalemia and hypermagnesemia Potassium improved. Hypernatremia resolved  History of recurrent group B strep bacteremia Followed by ID, last seen 06/2024 Continue suppressive amoxicillin  twice daily Acute infection not suspected at this time  Acute metabolic encephalopathy Improved.  Likely secondary to low blood pressure and acute kidney injury.  Type 2 diabetes mellitus with diabetic neuropathy, without long-term current use of insulin  (HCC) Check fingersticks daily and anytime she feels funny.  Hold Farxiga  currently.  Last hemoglobin A1c low at 5.7.  AF (paroxysmal atrial fibrillation) (HCC) Paroxysmal in nature. Continue amiodarone  Will hold metoprolol . Restarted Eliquis  on 12/7.  Scalp hematoma secondary to fall, subsequent encounter Continue Eliquis .  Obesity, Class III, BMI 40-49.9 (morbid obesity) (HCC) OSA BMI 43.29.  Falls, subsequent encounter Wheelchair/walker dependent Chronic lymphedema on lymphedema pumps PT/OT recommending rehab.  Patient changed her mind and would like to go out to rehab.  OSA (obstructive sleep apnea) Continue CPAP with pressure of 10 cm at night.         Consultants: None Procedures performed: None Disposition: Rehabilitation facility Diet recommendation:  Cardiac and Carb modified diet DISCHARGE MEDICATION: Allergies as of 08/23/2024       Reactions   Lisinopril Swelling         Medication List     STOP taking these medications    acetaminophen  650 MG CR tablet Commonly known as: TYLENOL  Replaced by: acetaminophen  325 MG tablet   ketoconazole  2 % cream Commonly known as: NIZORAL    metoprolol  succinate 50 MG 24 hr tablet Commonly known as: TOPROL -XL   omeprazole  40 MG capsule Commonly known as: PRILOSEC   oxybutynin  15 MG 24 hr tablet Commonly known as: DITROPAN  XL   oxyCODONE -acetaminophen  5-325 MG tablet Commonly known as: PERCOCET/ROXICET       TAKE these medications    Accu-Chek Guide test strip Generic drug: glucose blood TEST ONCE A DAY AS DIRECTED   acetaminophen  325 MG tablet Commonly known as: TYLENOL  Take 2 tablets (650 mg total) by mouth every 6 (six) hours as needed for mild pain (pain score 1-3) or fever (or Fever >/= 101). Replaces: acetaminophen  650 MG CR tablet   amiodarone  200 MG tablet Commonly known as: PACERONE  Take 200 mg by mouth daily.   amoxicillin  500 MG capsule Commonly known as: AMOXIL  Take 1 capsule (500 mg total) by mouth 2 (two) times daily.   apixaban  5 MG Tabs tablet Commonly known as: ELIQUIS  Take 1 tablet by mouth 2 (two) times daily.   atorvastatin  40 MG tablet Commonly known as: LIPITOR TAKE 1 TABLET BY MOUTH EVERY DAY IN THE EVENING   blood glucose meter kit and supplies 1 each by Other route daily at 12 noon. Dispense based on  patient and insurance preference. Check fsbs once a day   Calcium  Carbonate-Vitamin D  600-400 MG-UNIT tablet Take 1 tablet by mouth 2 (two) times daily.   dapagliflozin  propanediol 10 MG Tabs tablet Commonly known as: Farxiga  Take 1 tablet (10 mg total) by mouth daily before breakfast. Patient receives via [employer] Patient Assistance   escitalopram  5 MG tablet Commonly known as: LEXAPRO  TAKE 1 TABLET (5 MG TOTAL) BY MOUTH DAILY.   ferrous sulfate  325 (65 FE) MG EC tablet Take 1 tablet (325 mg total) by mouth daily.   fluticasone  50 MCG/ACT nasal spray Commonly  known as: FLONASE  SPRAY 2 SPRAYS INTO EACH NOSTRIL EVERY DAY   hydroquinone 4 % cream Apply topically at bedtime as needed.   latanoprost  0.005 % ophthalmic solution Commonly known as: XALATAN  Place 1 drop into both eyes at bedtime.   levothyroxine  75 MCG tablet Commonly known as: SYNTHROID  Take 1 tablet (75 mcg total) by mouth daily.   Magnesium  Oxide -Mg Supplement 500 MG Caps Take 500 mg by mouth daily.   midodrine  10 MG tablet Commonly known as: PROAMATINE  Take 1 tablet (10 mg total) by mouth 3 (three) times daily with meals.   oxyCODONE  5 MG immediate release tablet Commonly known as: Oxy IR/ROXICODONE  Take 1 tablet (5 mg total) by mouth every 6 (six) hours as needed for severe pain (pain score 7-10).   pantoprazole  40 MG tablet Commonly known as: PROTONIX  Take 1 tablet (40 mg total) by mouth 2 (two) times daily.   polyethylene glycol 17 g packet Commonly known as: MIRALAX  / GLYCOLAX  Take 17 g by mouth every other day.   potassium chloride  10 MEQ tablet Commonly known as: KLOR-CON  Take 10 mEq by mouth daily.   pregabalin  50 MG capsule Commonly known as: LYRICA  Take 1 capsule (50 mg total) by mouth every evening.   Simbrinza 1-0.2 % Susp Generic drug: Brinzolamide -Brimonidine  Apply 1 drop to eye 2 (two) times daily.   sodium chloride  0.65 % Soln nasal spray Commonly known as: OCEAN Place 1 spray into both nostrils 3 (three) times daily.   torsemide  20 MG tablet Commonly known as: DEMADEX  TAKE 1 TABLET BY MOUTH EVERY DAY               Durable Medical Equipment  (From admission, onward)           Start     Ordered   08/19/24 1312  For home use only DME Other see comment  Once       Comments: Deitra lift  Question:  Length of Need  Answer:  Lifetime   08/19/24 1311            Contact information for follow-up providers     Glenard Mire, MD Follow up.   Specialty: Family Medicine Why: after discharge from rehab Contact  information: [address] [place] [address] [place] [address] [phone]              Contact information for after-discharge care     Destination     [facility] .   Service: Skilled Nursing Contact information: [address] [place]  [address] [phone]             Home Medical Care     [facility]) .   Service: Home Health Services Contact information: [address] [place]  [address] [phone]  Discharge Exam: Filed Weights   08/17/24 1649 08/22/24 0500 08/23/24 0500  Weight: 132.9 kg (!) 140 kg (!) 140.8 kg   Physical Exam HENT:     Head: Normocephalic.  Eyes:     General: Lids are normal.  Cardiovascular:     Rate and Rhythm: Normal rate and regular rhythm.     Heart sounds: Normal heart sounds, S1 normal and S2 normal.  Pulmonary:     Breath sounds: No decreased breath sounds, wheezing, rhonchi or rales.  Abdominal:     Palpations: Abdomen is soft.     Tenderness: There is no abdominal tenderness.  Musculoskeletal:     Right lower leg: Swelling present.     Left lower leg: Swelling present.  Skin:    General: Skin is warm.     Comments: Hematoma on back of the left calf.  Bruising behind left leg.  Neurological:     Mental Status: She is alert.      Condition at discharge: stable  The results of significant diagnostics from this hospitalization (including imaging, microbiology, ancillary and laboratory) are listed below for reference.   Imaging Studies: US  Venous Img Lower Bilateral (DVT) Result Date: 08/20/2024 CLINICAL DATA:  Bilateral lower extremity swelling. History of DVT and IVC filter. EXAM: BILATERAL LOWER EXTREMITY VENOUS DOPPLER ULTRASOUND TECHNIQUE: Gray-scale sonography with graded compression, as well as color Doppler and duplex ultrasound  were performed to evaluate the lower extremity deep venous systems from the level of the common femoral vein and including the common femoral, femoral, profunda femoral, popliteal and calf veins including the posterior tibial, peroneal and gastrocnemius veins when visible. Spectral Doppler was utilized to evaluate flow at rest and with distal augmentation maneuvers in the common femoral, femoral and popliteal veins. COMPARISON:  02/03/2020 FINDINGS: RIGHT LOWER EXTREMITY Common Femoral Vein: Partial compressibility with echogenic peripheral thrombus compatible with old thrombus. Color Doppler flow in the right common femoral vein. Saphenofemoral Junction: Patent with color Doppler flow. Profunda Femoral Vein: Partial compressibility with color Doppler flow. Femoral Vein: Limited evaluation but the visualized portions demonstrate compressibility and color Doppler flow. Popliteal Vein: Partial compressibility in the popliteal vein with color Doppler flow. Calf Veins: Limited evaluation. Other Findings:  None. LEFT LOWER EXTREMITY Common Femoral Vein: Incomplete compressibility of the left common femoral vein with small echogenic foci and septations. Findings compatible with old or chronic thrombus. There is color Doppler flow within the left common femoral vein. Saphenofemoral Junction: No evidence for acute thrombus. Normal compressibility and color Doppler flow. Profunda Femoral Vein: Partial compressibility with small peripheral echogenic thrombus. Findings compatible with old thrombus. Color Doppler flow in the left profunda femoral vein. Femoral Vein: Limited evaluation of the left femoral vein. There is compressibility in the proximal left femoral vein. Popliteal Vein: Limited evaluation. There appears to be compressibility and color Doppler flow in the left popliteal vein. Calf Veins: Limited evaluation. Other Findings:  None. IMPRESSION: 1. Limited exam due to body habitus but no clear evidence for acute deep  venous thrombosis in lower extremities. 2. Evidence for old or chronic thrombus in the bilateral lower extremity veins. Electronically Signed   By: Juliene Balder M.D.   On: 08/20/2024 09:02   CT HEAD WO CONTRAST ( ) Result Date: 08/17/2024 EXAM: CT HEAD WITHOUT CONTRAST 08/17/2024 11:15:26 PM TECHNIQUE: CT of the head was performed without the administration of intravenous contrast. Automated exposure control, iterative reconstruction, and/or weight based adjustment of the mA/kV was utilized to reduce the radiation dose to  as low as reasonably achievable. COMPARISON: 08/16/2024 CLINICAL HISTORY: Head trauma, minor (Age >= 65y) FINDINGS: BRAIN AND VENTRICLES: No acute hemorrhage. No evidence of acute infarct. No hydrocephalus. No extra-axial collection. No mass effect or midline shift. Calcific atherosclerosis. ORBITS: No acute abnormality. SINUSES: No acute abnormality. SOFT TISSUES AND SKULL: Vertex scalp hematoma. Right parietal scalp hematoma. Partially imaged posterior fusion hardware in place. Chronic fracture. IMPRESSION: 1. No acute intracranial abnormality. 2. Vertex and right parietal scalp hematomas. Electronically signed by: Franky Stanford MD 08/17/2024 11:52 PM EST RP Workstation: [id number]   CT ABDOMEN PELVIS WO CONTRAST Result Date: 08/17/2024 EXAM: CT ABDOMEN AND PELVIS WITHOUT CONTRAST 08/17/2024 11:15:37 PM TECHNIQUE: CT of the abdomen and pelvis was performed without the administration of intravenous contrast. Multiplanar reformatted images are provided for review. Automated exposure control, iterative reconstruction, and/or weight-based adjustment of the mA/kV was utilized to reduce the radiation dose to as low as reasonably achievable. COMPARISON: Comparison with the 07/16/2021 study. CLINICAL HISTORY: Abdominal trauma, blunt. FINDINGS: LOWER CHEST: No acute abnormality. LIVER: The liver is unremarkable. GALLBLADDER AND BILE DUCTS: Cholelithiasis without evidence of acute cholecystitis. No  biliary ductal dilatation. SPLEEN: No acute abnormality. PANCREAS: No acute abnormality. ADRENAL GLANDS: No acute abnormality. KIDNEYS, URETERS AND BLADDER: No stones in the kidneys or ureters. No hydronephrosis. No perinephric or periureteral stranding. Urinary bladder is unremarkable. GI AND BOWEL: Stomach demonstrates no acute abnormality. Duodenal diverticulum with layering hyperdense debris. Normal appendix. There is no bowel obstruction. PERITONEUM AND RETROPERITONEUM: No ascites. No free air. VASCULATURE: Aortic atherosclerotic calcification. IVC filter. LYMPH NODES: No lymphadenopathy. REPRODUCTIVE ORGANS: No acute abnormality. BONES AND SOFT TISSUES: Left hip arthroplasty. 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 Subcutaneous edema in the bilateral thighs and left flank. IMPRESSION: 1. No acute findings in the abdomen or pelvis related to blunt abdominal trauma. Electronically signed by: Norman Gatlin MD 08/17/2024 11:28 PM EST RP Workstation: [id number]   DG Chest Portable 1 View Result Date: 08/17/2024 EXAM: 1 VIEW(S) XRAY OF THE CHEST 08/17/2024 05:09:29 PM COMPARISON: Yesterday. CLINICAL HISTORY: falls, hypotension FINDINGS: LUNGS AND PLEURA: Hypoinflation of the lungs is noted. No focal pulmonary opacity. No pleural effusion. No pneumothorax. HEART AND MEDIASTINUM: Stable cardiomediastinal silhouette. BONES AND SOFT TISSUES: Stable chronic findings seen involving both shoulders and proximal humeri. No acute osseous abnormality. IMPRESSION: 1. No acute cardiopulmonary process. Electronically signed by: Lynwood Seip MD 08/17/2024 05:53 PM EST RP Workstation: [id number]   CT Head Wo Contrast Result Date: 08/16/2024 CLINICAL DATA:  Head trauma, minor (Age >= 65y) EXAM: CT HEAD WITHOUT CONTRAST TECHNIQUE: Contiguous axial images were obtained from the base of the skull through the vertex without intravenous contrast. RADIATION DOSE REDUCTION: This exam was performed according to  the departmental dose-optimization program which includes automated exposure control, adjustment of the mA and/or kV according to patient size and/or use of iterative reconstruction technique. COMPARISON:  10/27/19 FINDINGS: Brain: Posterior fossa is partially obscured by craniocervical fusion hardware. No intracranial hemorrhage, mass effect, or midline shift. No hydrocephalus. The basilar cisterns are patent. No evidence of territorial infarct or acute ischemia. No extra-axial or intracranial fluid collection. Vascular: No hyperdense vessel or unexpected calcification. Skull: Negative for fracture or focal lesion. Sinuses/Orbits: No acute finding. Other: Large midline vertex scalp hematoma. IMPRESSION: Large midline vertex scalp hematoma. No acute intracranial abnormality. No skull fracture. Electronically Signed   By: Andrea Gasman M.D.   On: 08/16/2024 17:53   CT Cervical Spine Wo Contrast Result Date: 08/16/2024 CLINICAL DATA:  Neck trauma (Age >= 65y) Slid out of bed onto floor. EXAM: CT CERVICAL SPINE WITHOUT CONTRAST TECHNIQUE: Multidetector CT imaging of the cervical spine was performed without intravenous contrast. Multiplanar CT image reconstructions were also generated. RADIATION DOSE REDUCTION: This exam was performed according to the departmental dose-optimization program which includes automated exposure control, adjustment of the mA and/or kV according to patient size and/or use of iterative reconstruction technique. COMPARISON:  Cervical spine MRI 08/01/2024 FINDINGS: Alignment: Straightening of normal lordosis. Chronic dens fracture with anterior positioning of to C1 relative to C2, unchanged from prior MRI. No acute subluxation. Skull base and vertebrae: Posterior fusion from the occiput through C5. Lucency through the right C2 screw measures up to 2 mm. Lucency adjacent to the right C3 screw measures up to 2 mm. The hardware is intact. Chronic dens fracture with anterior positioning of C1  relative to C2, unchanged from prior MRI. No acute fracture. Soft tissues and spinal canal: No prevertebral soft tissue thickening. No evidence of canal hematoma. Disc levels: Diffuse disc space narrowing and irregularity, unchanged. Upper chest: No acute findings. Other: Chronic shoulder arthropathy is partially included. IMPRESSION: 1. No acute fracture or subluxation of the cervical spine. 2. Posterior fusion from the occiput through C5. Lucency adjacent to the right C2 and C3 screws, suspicious for loosening. 3. Chronic dens fracture with anterior positioning of C1 relative to C2, unchanged from prior MRI. Electronically Signed   By: Andrea Gasman M.D.   On: 08/16/2024 17:48   DG Chest 1 View Result Date: 08/16/2024 EXAM: 1 VIEW(S) XRAY OF THE CHEST 08/16/2024 04:38:20 PM COMPARISON: None available. CLINICAL HISTORY: wheezing for the past week FINDINGS: LUNGS AND PLEURA: Low lung volumes without acute airspace opacification. No pleural effusion. No pneumothorax. HEART AND MEDIASTINUM: Aortic atherosclerosis. No acute abnormality of the cardiac and mediastinal silhouettes. BONES AND SOFT TISSUES: Cervical fixation hardware partially visualized. Chronic stable lytic process both shoulder girdles. Chronic left shoulder dislocation. No acute osseous abnormality. IMPRESSION: 1. Hypoinflation without acute cardiopulmonary disease. 2. Chronic lytic process bilateral shoulder girdles unchanged. Electronically signed by: Toribio Agreste MD 08/16/2024 05:02 PM EST RP Workstation: [id number]   DG Knee 1-2 Views Right Result Date: 08/16/2024 EXAM: 1 or 2 VIEW(S) XRAY OF THE RIGHT KNEE 08/16/2024 03:56:32 PM COMPARISON: Comparison 03/11/2016. Status post right total knee arthroplasty. CLINICAL HISTORY: Fall. FINDINGS: BONES AND JOINTS: Probable mildly displaced patellar fracture is noted. Status post right total knee arthroplasty. No focal osseous lesion. No joint dislocation. No significant joint effusion. SOFT  TISSUES: The soft tissues are unremarkable. IMPRESSION: 1. Status post right total knee arthroplasty. 2. Probable mildly displaced patellar fracture. Electronically signed by: Lynwood Seip MD 08/16/2024 04:05 PM EST RP Workstation: [id number]   VAS US  LOWER EXTREMITY VENOUS (DVT) Result Date: 08/01/2024  Lower Venous DVT Study Patient Name:  CUBA NATARAJAN  Date of Exam:   07/27/2024 Medical Rec #: [id number]        Accession #:    [id number] Date of Birth: 1946-01-06        Patient Gender: F Patient Age:   1 years Exam Location:  [facility] Procedure:      VAS US  LOWER EXTREMITY VENOUS (DVT) Referring Phys: CORDELLA SHAWL --------------------------------------------------------------------------------  Indications: Swelling, and left.  Limitations: Body habitus and wheelchair bound. Performing Technologist: Jerel Croak RVT  Examination Guidelines: A complete evaluation includes B-mode imaging, spectral Doppler, color Doppler, and power Doppler as needed of all accessible portions of each vessel. Bilateral testing is  considered an integral part of a complete examination. Limited examinations for reoccurring indications may be performed as noted. The reflux portion of the exam is performed with the patient in reverse Trendelenburg.  +-----+---------------+---------+-----------+----------+--------------+ RIGHTCompressibilityPhasicitySpontaneityPropertiesThrombus Aging +-----+---------------+---------+-----------+----------+--------------+ CFV  Full           Yes                                          +-----+---------------+---------+-----------+----------+--------------+ SFJ  Full           Yes                                          +-----+---------------+---------+-----------+----------+--------------+   +-------+---------------+---------+-----------+----------+--------------+ LEFT   CompressibilityPhasicitySpontaneityPropertiesThrombus Aging  +-------+---------------+---------+-----------+----------+--------------+ CFV    Full           Yes                                          +-------+---------------+---------+-----------+----------+--------------+ SFJ    Full           Yes                                          +-------+---------------+---------+-----------+----------+--------------+ FV Mid Full           Yes                                          +-------+---------------+---------+-----------+----------+--------------+ PFV    Full           Yes                                          +-------+---------------+---------+-----------+----------+--------------+ POP    Full           Yes                                          +-------+---------------+---------+-----------+----------+--------------+ PTV    Full           Yes                                          +-------+---------------+---------+-----------+----------+--------------+ GastrocFull           Yes                                          +-------+---------------+---------+-----------+----------+--------------+ GSV    Full           Yes                                          +-------+---------------+---------+-----------+----------+--------------+  Summary: RIGHT: - No evidence of common femoral vein obstruction.   LEFT: - There is no evidence of deep vein thrombosis in the lower extremity. However, portions of this examination were limited- see technologist comments above.  *See table(s) above for measurements and observations. Electronically signed by Cordella Shawl MD on 08/01/2024 at 9:24:20 AM.    Final     Microbiology: Results for orders placed or performed during the hospital encounter of 08/17/24  Resp panel by RT-PCR (RSV, Flu A&B, Covid) Anterior Nasal Swab     Status: None   Collection Time: 08/17/24  5:52 PM   Specimen: Anterior Nasal Swab  Result Value Ref Range Status   SARS Coronavirus 2 by RT PCR  NEGATIVE NEGATIVE Final    Comment: (NOTE) SARS-CoV-2 target nucleic acids are NOT DETECTED.  The SARS-CoV-2 RNA is generally detectable in upper respiratory specimens during the acute phase of infection. The lowest concentration of SARS-CoV-2 viral copies this assay can detect is 138 copies/mL. A negative result does not preclude SARS-Cov-2 infection and should not be used as the sole basis for treatment or other patient management decisions. A negative result may occur with  improper specimen collection/handling, submission of specimen other than nasopharyngeal swab, presence of viral mutation(s) within the areas targeted by this assay, and inadequate number of viral copies(<138 copies/mL). A negative result must be combined with clinical observations, patient history, and epidemiological information. The expected result is Negative.  Fact Sheet for Patients:  bloggercourse.com  Fact Sheet for Healthcare Providers:  seriousbroker.it  This test is no t yet approved or cleared by the [place]  FDA and  has been authorized for detection and/or diagnosis of SARS-CoV-2 by FDA under an Emergency Use Authorization (EUA). This EUA will remain  in effect (meaning this test can be used) for the duration of the COVID-19 declaration under Section 564(b)(1) of the Act, 21 U.S.C.section 360bbb-3(b)(1), unless the authorization is terminated  or revoked sooner.       Influenza A by PCR NEGATIVE NEGATIVE Final   Influenza B by PCR NEGATIVE NEGATIVE Final    Comment: (NOTE) The Xpert Xpress SARS-CoV-2/FLU/RSV plus assay is intended as an aid in the diagnosis of influenza from Nasopharyngeal swab specimens and should not be used as a sole basis for treatment. Nasal washings and aspirates are unacceptable for Xpert Xpress SARS-CoV-2/FLU/RSV testing.  Fact Sheet for Patients: bloggercourse.com  Fact Sheet for  Healthcare Providers: seriousbroker.it  This test is not yet approved or cleared by the [place]  FDA and has been authorized for detection and/or diagnosis of SARS-CoV-2 by FDA under an Emergency Use Authorization (EUA). This EUA will remain in effect (meaning this test can be used) for the duration of the COVID-19 declaration under Section 564(b)(1) of the Act, 21 U.S.C. section 360bbb-3(b)(1), unless the authorization is terminated or revoked.     Resp Syncytial Virus by PCR NEGATIVE NEGATIVE Final    Comment: (NOTE) Fact Sheet for Patients: bloggercourse.com  Fact Sheet for Healthcare Providers: seriousbroker.it  This test is not yet approved or cleared by the [place]  FDA and has been authorized for detection and/or diagnosis of SARS-CoV-2 by FDA under an Emergency Use Authorization (EUA). This EUA will remain in effect (meaning this test can be used) for the duration of the COVID-19 declaration under Section 564(b)(1) of the Act, 21 U.S.C. section 360bbb-3(b)(1), unless the authorization is terminated or revoked.  Performed at [facility], [address], [place], [place] [address]  Labs: CBC: Recent Labs  Lab 08/17/24 1647 08/18/24 0026 08/18/24 0525 08/18/24 1634 08/19/24 0623 08/20/24 1034 08/21/24 0528 08/22/24 0553 08/23/24 0550 08/23/24 1448  WBC 7.0 7.3 7.1  --  8.0 9.1  --   --   --   --   NEUTROABS 4.2  --   --   --   --   --   --   --   --   --   HGB 9.8* 9.2* 8.7*   < > 7.8* 8.4* 8.7* 8.3* 7.7* 8.3*  HCT 31.4* 29.5* 27.6*  --  24.8* 27.2*  --   --   --   --   MCV 101.9* 102.8* 102.2*  --  102.9* 99.6  --   --   --   --   PLT 135* 124* 117*  --  114* 121*  --   --   --   --    < > = values in this interval not displayed.   Basic Metabolic Panel: Recent Labs  Lab 08/17/24 1647 08/17/24 1915 08/19/24 0623 08/20/24 1034 08/21/24 0528  08/22/24 0553 08/23/24 0550  NA 141   < > 146* 144 142 140 138  K 5.3*   < > 4.9 4.4 4.0 3.8 3.5  CL 106   < > 111 112* 110 108 107  CO2 26   < > 24 24 22 22 22   GLUCOSE 96   < > 87 103* 101* 84 83  BUN 39*   < > 38* 30* 26* 22 18  CREATININE 2.31*   < > 2.22* 1.84* 1.60* 1.44* 1.27*  CALCIUM  8.8*   < > 8.2* 8.3* 8.4* 8.4* 8.1*  MG 2.9*  --   --   --   --   --   --    < > = values in this interval not displayed.   Liver Function Tests: Recent Labs  Lab 08/17/24 1647  AST 52*  ALT 27  ALKPHOS 89  BILITOT 0.6  PROT 6.7  ALBUMIN 3.3*   CBG: No results for input(s): GLUCAP in the last 168 hours.  Discharge time spent: greater than 30 minutes.  Signed: Charlie Patterson, MD [facility] 08/23/2024

## 2024-08-23 NOTE — Plan of Care (Signed)

## 2024-08-24 DIAGNOSIS — E861 Hypovolemia: Secondary | ICD-10-CM

## 2024-08-24 DIAGNOSIS — E875 Hyperkalemia: Secondary | ICD-10-CM

## 2024-08-24 DIAGNOSIS — D62 Acute posthemorrhagic anemia: Secondary | ICD-10-CM | POA: Diagnosis not present

## 2024-08-24 DIAGNOSIS — N1832 Chronic kidney disease, stage 3b: Secondary | ICD-10-CM | POA: Diagnosis not present

## 2024-08-24 DIAGNOSIS — N179 Acute kidney failure, unspecified: Secondary | ICD-10-CM | POA: Diagnosis not present

## 2024-08-24 LAB — BASIC METABOLIC PANEL WITH GFR
Anion gap: 10 (ref 5–15)
BUN: 16 mg/dL (ref 8–23)
CO2: 24 mmol/L (ref 22–32)
Calcium: 7.9 mg/dL — ABNORMAL LOW (ref 8.9–10.3)
Chloride: 105 mmol/L (ref 98–111)
Creatinine, Ser: 1.22 mg/dL — ABNORMAL HIGH (ref 0.44–1.00)
GFR, Estimated: 45 mL/min — ABNORMAL LOW (ref >60)
Glucose, Bld: 83 mg/dL (ref 70–99)
Potassium: 3.5 mmol/L (ref 3.5–5.1)
Sodium: 138 mmol/L (ref 135–145)

## 2024-08-24 LAB — HEMOGLOBIN: Hemoglobin: 8.1 g/dL — ABNORMAL LOW (ref 12.0–15.0)

## 2024-08-24 MED ORDER — POTASSIUM CHLORIDE CRYS ER 20 MEQ PO TBCR
40.0000 meq | EXTENDED_RELEASE_TABLET | Freq: Once | ORAL | Status: AC
  Administered 2024-08-24: 40 meq via ORAL
  Filled 2024-08-24: qty 2

## 2024-08-24 NOTE — Plan of Care (Signed)
 IV removed, report called to [facility], and patient transported to facility. Patient notified family of transfer.

## 2024-08-24 NOTE — Plan of Care (Signed)

## 2024-08-24 NOTE — Discharge Summary (Signed)
 Physician Discharge Summary   Patient: Becky Trevino MRN: [id number] DOB: May 17, 1946  Admit date:     08/17/2024  Discharge date: 08/24/24  Discharge Physician: Murvin Mana   PCP: Sowles, Krichna, MD   Recommendations at discharge:   Follow-up team at rehab 1 day Check BMP and hemoglobin in about 5 days   Discharge Diagnoses: Active Problems:   Acute renal failure superimposed on stage 3b chronic kidney disease (HCC)   ABLA (acute blood loss anemia) versus hemodilutional anemia   Chronic anticoagulation   History of recurrent group B strep bacteremia   Electrolyte disturbance   Scalp hematoma secondary to fall, subsequent encounter   AF (paroxysmal atrial fibrillation) (HCC)   Type 2 diabetes mellitus with diabetic neuropathy, without long-term current use of insulin  (HCC)   Acute metabolic encephalopathy   Falls, subsequent encounter   Obesity, Class III, BMI 40-49.9 (morbid obesity) (HCC)   OSA (obstructive sleep apnea)   Thrombocytopenia  Principal Problem (Resolved):   Hypotension Resolved Problems:   Hyperkalemia  Hospital Course: 78 y.o. female with medical history significant for paroxysmal atrial fibrillation, HTN, SVT, pulmonary HTN, CKD stage 3b, T2DM, chronic DVT/lymphedema on pump, wheelchair/walker dependent. 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  Patient, who is wheelchair/walker dependent presented for the second time in 2 days for evaluation of somnolence/weakness resulting in recent falls.  Workup on 12/2 showed a large scalp hematoma and AKI.[name]  She was hydrated and family opted to take her home.  Patient remained difficult to arouse at home, thus prompting the return visit to the ED.  She did not fall again..  She has had no recent fever or chills, vomiting or diarrhea or other  complaints. In the ED, BP as low as 84/47, improving to 114/70 with hydration then again downtrending to 91/51 by admission.  Heart rate was in the 60s and other vitals within normal limits. Labs notable for new anemia of 9.8, down from baseline of 12.8 the day prior,Creatinine of 2.31 above baseline of 1.3 and up from the day prior of 1.73.  Potassium 5.3 with magnesium  2.9.  Baseline thrombocytopenia of 135,000. Troponin 38 Urinalysis without infection, respiratory viral panel negative for COVID flu and RSV. EKG showed sinus of 62 Chest x-ray nonacute CT head from the day prior (12/2) showed a large midline vertex scalp hematoma with no acute intracranial findings CT C-spine from the day prior was nonacute Patient treated with 2 L LR.  12/4.  Patient complains of back pain.  Patient with hypotension requiring fluid boluses.  Bladder scan showing empty bladder.  Patient had a drop in hemoglobin with hemoglobin down to 8.7. 12/5.  Transfuse 1 unit of blood since hemoglobin down to 7.8.  Continue IV fluids.  Continue to hold Eliquis . 12/6.  Hemoglobin 8.4.  Will try to discontinue IV fluids since creatinine down to 1.84.  Continue midodrine  for hypotension. 12/7.  Hemoglobin 8.7.  Try to restart Eliquis  this evening.  Creatinine down to 1.6.  IV Venofer  given. 12/8.  Hemoglobin 8.3.  Creatinine down to 1.44.  Blood pressure holding on midodrine . 12.9.  Hemoglobin down to 7.7.  IV iron  given again today.  Since creatinine down to 1.27 which is her baseline I gave a dose of IV Lasix .  Hemoglobin upon discharge 8.3.  Will discharge to facility.  Assessment and Plan: * Hypotension Continue midodrine   10 mg 3 times daily with meals.  Hold antihypertensive medications.  Blood pressure stable on midodrine .  Based on blood pressure, patient will continue to require midodrine .  May consider weaning the dose when blood pressure is elevated.   Acute renal failure superimposed on stage 3b chronic kidney  disease (HCC) Creatinine 1.74 on presentation.  Creatinine peaked at 2.36.  She has back to baseline.   Chronic anticoagulation Sonogram lower extremity shows chronic DVT.  Restarted Eliquis  in the evening of 12/7.   ABLA (acute blood loss anemia) versus hemodilutional anemia Large scalp hematoma on head CT 08/16/2024 Chronic anticoagulation Hemoglobin down to 7.8 on 12/5 and given 1 unit of packed red blood cells.  Given IV Venofer  on 12/7.  Will restart Eliquis  this evening and monitor hemoglobin.  Hemoglobin this morning 7.7.  IV Venofer  given 11/9.  Hemoglobin 8.1 today.   Electrolyte disturbance Hyperkalemia and hypermagnesemia In the setting of acute renal failure.  Potassium is 3.5, give a 40 mEq of oral potassium before discharge.   History of recurrent group B strep bacteremia Followed by ID, last seen 06/2024 Continue suppressive amoxicillin  twice daily Acute infection not suspected at this time   Acute metabolic encephalopathy Improved.  Likely secondary to low blood pressure and acute kidney injury.   Type 2 diabetes mellitus with diabetic neuropathy, without long-term current use of insulin  (HCC) Check fingersticks daily and anytime she feels funny.  Hold Farxiga  currently.  Last hemoglobin A1c low at 5.7.   AF (paroxysmal atrial fibrillation) (HCC) Paroxysmal in nature. Continue amiodarone  Will hold metoprolol . Restarted Eliquis  on 12/7.   Scalp hematoma secondary to fall, subsequent encounter Continue Eliquis .   Obesity, Class III, BMI 40-49.9 (morbid obesity) (HCC) OSA BMI 43.29.   Falls, subsequent encounter Wheelchair/walker dependent Chronic lymphedema on lymphedema pumps PT/OT recommending rehab.  Patient changed her mind and would like to go out to rehab.   OSA (obstructive sleep apnea) Continue CPAP with pressure of 10 cm at night.       Consultants: None Procedures performed: None  Disposition: Skilled nursing facility Diet recommendation:   Cardiac diet DISCHARGE MEDICATION: Allergies as of 08/24/2024       Reactions   Lisinopril Swelling        Medication List     STOP taking these medications    acetaminophen  650 MG CR tablet Commonly known as: TYLENOL  Replaced by: acetaminophen  325 MG tablet   ketoconazole  2 % cream Commonly known as: NIZORAL    metoprolol  succinate 50 MG 24 hr tablet Commonly known as: TOPROL -XL   omeprazole  40 MG capsule Commonly known as: PRILOSEC   oxybutynin  15 MG 24 hr tablet Commonly known as: DITROPAN  XL   oxyCODONE -acetaminophen  5-325 MG tablet Commonly known as: PERCOCET/ROXICET       TAKE these medications    Accu-Chek Guide test strip Generic drug: glucose blood TEST ONCE A DAY AS DIRECTED   acetaminophen  325 MG tablet Commonly known as: TYLENOL  Take 2 tablets (650 mg total) by mouth every 6 (six) hours as needed for mild pain (pain score 1-3) or fever (or Fever >/= 101). Replaces: acetaminophen  650 MG CR tablet   amiodarone  200 MG tablet Commonly known as: PACERONE  Take 200 mg by mouth daily.   amoxicillin  500 MG capsule Commonly known as: AMOXIL  Take 1 capsule (500 mg total) by mouth 2 (two) times daily.   apixaban  5 MG Tabs tablet Commonly known as: ELIQUIS  Take 1 tablet by mouth 2 (two) times daily.   atorvastatin   40 MG tablet Commonly known as: LIPITOR TAKE 1 TABLET BY MOUTH EVERY DAY IN THE EVENING   blood glucose meter kit and supplies 1 each by Other route daily at 12 noon. Dispense based on patient and insurance preference. Check fsbs once a day   Calcium  Carbonate-Vitamin D  600-400 MG-UNIT tablet Take 1 tablet by mouth 2 (two) times daily.   dapagliflozin  propanediol 10 MG Tabs tablet Commonly known as: Farxiga  Take 1 tablet (10 mg total) by mouth daily before breakfast. Patient receives via [employer] Patient Assistance   escitalopram  5 MG tablet Commonly known as: LEXAPRO  TAKE 1 TABLET (5 MG TOTAL) BY MOUTH DAILY.   ferrous sulfate  325  (65 FE) MG EC tablet Take 1 tablet (325 mg total) by mouth daily.   fluticasone  50 MCG/ACT nasal spray Commonly known as: FLONASE  SPRAY 2 SPRAYS INTO EACH NOSTRIL EVERY DAY   hydroquinone 4 % cream Apply topically at bedtime as needed.   latanoprost  0.005 % ophthalmic solution Commonly known as: XALATAN  Place 1 drop into both eyes at bedtime.   levothyroxine  75 MCG tablet Commonly known as: SYNTHROID  Take 1 tablet (75 mcg total) by mouth daily.   Magnesium  Oxide -Mg Supplement 500 MG Caps Take 500 mg by mouth daily.   midodrine  10 MG tablet Commonly known as: PROAMATINE  Take 1 tablet (10 mg total) by mouth 3 (three) times daily with meals.   oxyCODONE  5 MG immediate release tablet Commonly known as: Oxy IR/ROXICODONE  Take 1 tablet (5 mg total) by mouth every 6 (six) hours as needed for severe pain (pain score 7-10).   pantoprazole  40 MG tablet Commonly known as: PROTONIX  Take 1 tablet (40 mg total) by mouth 2 (two) times daily.   polyethylene glycol 17 g packet Commonly known as: MIRALAX  / GLYCOLAX  Take 17 g by mouth every other day.   potassium chloride  10 MEQ tablet Commonly known as: KLOR-CON  Take 10 mEq by mouth daily.   pregabalin  50 MG capsule Commonly known as: LYRICA  Take 1 capsule (50 mg total) by mouth every evening.   Simbrinza 1-0.2 % Susp Generic drug: Brinzolamide -Brimonidine  Apply 1 drop to eye 2 (two) times daily.   sodium chloride  0.65 % Soln nasal spray Commonly known as: OCEAN Place 1 spray into both nostrils 3 (three) times daily.   torsemide  20 MG tablet Commonly known as: DEMADEX  TAKE 1 TABLET BY MOUTH EVERY DAY               Durable Medical Equipment  (From admission, onward)           Start     Ordered   08/19/24 1312  For home use only DME Other see comment  Once       Comments: Deitra lift  Question:  Length of Need  Answer:  Lifetime   08/19/24 1311            Contact information for follow-up providers      Glenard Mire, MD Follow up.   Specialty: Family Medicine Why: after discharge from rehab Contact information: [address] [place] [address] [place] [address] [phone]              Contact information for after-discharge care     Destination     [facility] .   Service: Skilled Nursing Contact information: [address] [place]   [address] [phone]             Home Medical Care  [facility]) .   Service: Home Health Services Contact information: [address] [place]  [address] [phone]                    Discharge Exam: Fredricka Weights   08/22/24 0500 08/23/24 0500 08/24/24 0500  Weight: (!) 140 kg (!) 140.8 kg (!) 138.4 kg   General exam: Appears calm and comfortable, morbid obesity. Respiratory system: Clear to auscultation. Respiratory effort normal. Cardiovascular system: S1 & S2 heard, RRR. No JVD, murmurs, rubs, gallops or clicks. No pedal edema. Gastrointestinal system: Abdomen is nondistended, soft and nontender. No organomegaly or masses felt. Normal bowel sounds heard. Central nervous system: Alert and oriented. No focal neurological deficits. Extremities: Symmetric 5 x 5 power. Skin: No rashes, lesions or ulcers Psychiatry: Judgement and insight appear normal. Mood & affect appropriate. '  Condition at discharge: good  The results of significant diagnostics from this hospitalization (including imaging, microbiology, ancillary and laboratory) are listed below for reference.   Imaging Studies: US  Venous Img Lower Bilateral (DVT) Result Date: 08/20/2024 CLINICAL DATA:  Bilateral lower extremity swelling. History of DVT and IVC filter. EXAM: BILATERAL LOWER EXTREMITY VENOUS DOPPLER ULTRASOUND TECHNIQUE: Gray-scale sonography with graded compression, as well as color Doppler and  duplex ultrasound were performed to evaluate the lower extremity deep venous systems from the level of the common femoral vein and including the common femoral, femoral, profunda femoral, popliteal and calf veins including the posterior tibial, peroneal and gastrocnemius veins when visible. Spectral Doppler was utilized to evaluate flow at rest and with distal augmentation maneuvers in the common femoral, femoral and popliteal veins. COMPARISON:  02/03/2020 FINDINGS: RIGHT LOWER EXTREMITY Common Femoral Vein: Partial compressibility with echogenic peripheral thrombus compatible with old thrombus. Color Doppler flow in the right common femoral vein. Saphenofemoral Junction: Patent with color Doppler flow. Profunda Femoral Vein: Partial compressibility with color Doppler flow. Femoral Vein: Limited evaluation but the visualized portions demonstrate compressibility and color Doppler flow. Popliteal Vein: Partial compressibility in the popliteal vein with color Doppler flow. Calf Veins: Limited evaluation. Other Findings:  None. LEFT LOWER EXTREMITY Common Femoral Vein: Incomplete compressibility of the left common femoral vein with small echogenic foci and septations. Findings compatible with old or chronic thrombus. There is color Doppler flow within the left common femoral vein. Saphenofemoral Junction: No evidence for acute thrombus. Normal compressibility and color Doppler flow. Profunda Femoral Vein: Partial compressibility with small peripheral echogenic thrombus. Findings compatible with old thrombus. Color Doppler flow in the left profunda femoral vein. Femoral Vein: Limited evaluation of the left femoral vein. There is compressibility in the proximal left femoral vein. Popliteal Vein: Limited evaluation. There appears to be compressibility and color Doppler flow in the left popliteal vein. Calf Veins: Limited evaluation. Other Findings:  None. IMPRESSION: 1. Limited exam due to body habitus but no clear  evidence for acute deep venous thrombosis in lower extremities. 2. Evidence for old or chronic thrombus in the bilateral lower extremity veins. Electronically Signed   By: Juliene Balder M.D.   On: 08/20/2024 09:02   CT HEAD WO CONTRAST ( ) Result Date: 08/17/2024 EXAM: CT HEAD WITHOUT CONTRAST 08/17/2024 11:15:26 PM TECHNIQUE: CT of the head was performed without the administration of intravenous contrast. Automated exposure control, iterative reconstruction, and/or weight based adjustment of the mA/kV was utilized to reduce the radiation dose to as low as reasonably achievable. COMPARISON: 08/16/2024 CLINICAL HISTORY: Head trauma, minor (Age >=  65y) FINDINGS: BRAIN AND VENTRICLES: No acute hemorrhage. No evidence of acute infarct. No hydrocephalus. No extra-axial collection. No mass effect or midline shift. Calcific atherosclerosis. ORBITS: No acute abnormality. SINUSES: No acute abnormality. SOFT TISSUES AND SKULL: Vertex scalp hematoma. Right parietal scalp hematoma. Partially imaged posterior fusion hardware in place. Chronic fracture. IMPRESSION: 1. No acute intracranial abnormality. 2. Vertex and right parietal scalp hematomas. Electronically signed by: Franky Stanford MD 08/17/2024 11:52 PM EST RP Workstation: [id number]   CT ABDOMEN PELVIS WO CONTRAST Result Date: 08/17/2024 EXAM: CT ABDOMEN AND PELVIS WITHOUT CONTRAST 08/17/2024 11:15:37 PM TECHNIQUE: CT of the abdomen and pelvis was performed without the administration of intravenous contrast. Multiplanar reformatted images are provided for review. Automated exposure control, iterative reconstruction, and/or weight-based adjustment of the mA/kV was utilized to reduce the radiation dose to as low as reasonably achievable. COMPARISON: Comparison with the 07/16/2021 study. CLINICAL HISTORY: Abdominal trauma, blunt. FINDINGS: LOWER CHEST: No acute abnormality. LIVER: The liver is unremarkable. GALLBLADDER AND BILE DUCTS: Cholelithiasis without evidence of  acute cholecystitis. No biliary ductal dilatation. SPLEEN: No acute abnormality. PANCREAS: No acute abnormality. ADRENAL GLANDS: No acute abnormality. KIDNEYS, URETERS AND BLADDER: No stones in the kidneys or ureters. No hydronephrosis. No perinephric or periureteral stranding. Urinary bladder is unremarkable. GI AND BOWEL: Stomach demonstrates no acute abnormality. Duodenal diverticulum with layering hyperdense debris. Normal appendix. There is no bowel obstruction. PERITONEUM AND RETROPERITONEUM: No ascites. No free air. VASCULATURE: Aortic atherosclerotic calcification. IVC filter. LYMPH NODES: No lymphadenopathy. REPRODUCTIVE ORGANS: No acute abnormality. BONES AND SOFT TISSUES: Left hip arthroplasty. 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 Subcutaneous edema in the bilateral thighs and left flank. IMPRESSION: 1. No acute findings in the abdomen or pelvis related to blunt abdominal trauma. Electronically signed by: Norman Gatlin MD 08/17/2024 11:28 PM EST RP Workstation: [id number]   DG Chest Portable 1 View Result Date: 08/17/2024 EXAM: 1 VIEW(S) XRAY OF THE CHEST 08/17/2024 05:09:29 PM COMPARISON: Yesterday. CLINICAL HISTORY: falls, hypotension FINDINGS: LUNGS AND PLEURA: Hypoinflation of the lungs is noted. No focal pulmonary opacity. No pleural effusion. No pneumothorax. HEART AND MEDIASTINUM: Stable cardiomediastinal silhouette. BONES AND SOFT TISSUES: Stable chronic findings seen involving both shoulders and proximal humeri. No acute osseous abnormality. IMPRESSION: 1. No acute cardiopulmonary process. Electronically signed by: Lynwood Seip MD 08/17/2024 05:53 PM EST RP Workstation: [id number]   CT Head Wo Contrast Result Date: 08/16/2024 CLINICAL DATA:  Head trauma, minor (Age >= 65y) EXAM: CT HEAD WITHOUT CONTRAST TECHNIQUE: Contiguous axial images were obtained from the base of the skull through the vertex without intravenous contrast. RADIATION DOSE REDUCTION: This exam was  performed according to the departmental dose-optimization program which includes automated exposure control, adjustment of the mA and/or kV according to patient size and/or use of iterative reconstruction technique. COMPARISON:  10/27/19 FINDINGS: Brain: Posterior fossa is partially obscured by craniocervical fusion hardware. No intracranial hemorrhage, mass effect, or midline shift. No hydrocephalus. The basilar cisterns are patent. No evidence of territorial infarct or acute ischemia. No extra-axial or intracranial fluid collection. Vascular: No hyperdense vessel or unexpected calcification. Skull: Negative for fracture or focal lesion. Sinuses/Orbits: No acute finding. Other: Large midline vertex scalp hematoma. IMPRESSION: Large midline vertex scalp hematoma. No acute intracranial abnormality. No skull fracture. Electronically Signed   By: Andrea Gasman M.D.   On: 08/16/2024 17:53   CT Cervical Spine Wo Contrast Result Date: 08/16/2024 CLINICAL DATA:  Neck trauma (Age >= 65y) Slid out of bed onto floor. EXAM: CT CERVICAL  SPINE WITHOUT CONTRAST TECHNIQUE: Multidetector CT imaging of the cervical spine was performed without intravenous contrast. Multiplanar CT image reconstructions were also generated. RADIATION DOSE REDUCTION: This exam was performed according to the departmental dose-optimization program which includes automated exposure control, adjustment of the mA and/or kV according to patient size and/or use of iterative reconstruction technique. COMPARISON:  Cervical spine MRI 08/01/2024 FINDINGS: Alignment: Straightening of normal lordosis. Chronic dens fracture with anterior positioning of to C1 relative to C2, unchanged from prior MRI. No acute subluxation. Skull base and vertebrae: Posterior fusion from the occiput through C5. Lucency through the right C2 screw measures up to 2 mm. Lucency adjacent to the right C3 screw measures up to 2 mm. The hardware is intact. Chronic dens fracture with  anterior positioning of C1 relative to C2, unchanged from prior MRI. No acute fracture. Soft tissues and spinal canal: No prevertebral soft tissue thickening. No evidence of canal hematoma. Disc levels: Diffuse disc space narrowing and irregularity, unchanged. Upper chest: No acute findings. Other: Chronic shoulder arthropathy is partially included. IMPRESSION: 1. No acute fracture or subluxation of the cervical spine. 2. Posterior fusion from the occiput through C5. Lucency adjacent to the right C2 and C3 screws, suspicious for loosening. 3. Chronic dens fracture with anterior positioning of C1 relative to C2, unchanged from prior MRI. Electronically Signed   By: Andrea Gasman M.D.   On: 08/16/2024 17:48   DG Chest 1 View Result Date: 08/16/2024 EXAM: 1 VIEW(S) XRAY OF THE CHEST 08/16/2024 04:38:20 PM COMPARISON: None available. CLINICAL HISTORY: wheezing for the past week FINDINGS: LUNGS AND PLEURA: Low lung volumes without acute airspace opacification. No pleural effusion. No pneumothorax. HEART AND MEDIASTINUM: Aortic atherosclerosis. No acute abnormality of the cardiac and mediastinal silhouettes. BONES AND SOFT TISSUES: Cervical fixation hardware partially visualized. Chronic stable lytic process both shoulder girdles. Chronic left shoulder dislocation. No acute osseous abnormality. IMPRESSION: 1. Hypoinflation without acute cardiopulmonary disease. 2. Chronic lytic process bilateral shoulder girdles unchanged. Electronically signed by: Toribio Agreste MD 08/16/2024 05:02 PM EST RP Workstation: [id number]   DG Knee 1-2 Views Right Result Date: 08/16/2024 EXAM: 1 or 2 VIEW(S) XRAY OF THE RIGHT KNEE 08/16/2024 03:56:32 PM COMPARISON: Comparison 03/11/2016. Status post right total knee arthroplasty. CLINICAL HISTORY: Fall. FINDINGS: BONES AND JOINTS: Probable mildly displaced patellar fracture is noted. Status post right total knee arthroplasty. No focal osseous lesion. No joint dislocation. No significant  joint effusion. SOFT TISSUES: The soft tissues are unremarkable. IMPRESSION: 1. Status post right total knee arthroplasty. 2. Probable mildly displaced patellar fracture. Electronically signed by: Lynwood Seip MD 08/16/2024 04:05 PM EST RP Workstation: [id number]   VAS US  LOWER EXTREMITY VENOUS (DVT) Result Date: 08/01/2024  Lower Venous DVT Study Patient Name:  CONNEE IKNER  Date of Exam:   07/27/2024 Medical Rec #: [id number]        Accession #:    [id number] Date of Birth: 06-14-1946        Patient Gender: F Patient Age:   67 years Exam Location:  [facility] Procedure:      VAS US  LOWER EXTREMITY VENOUS (DVT) Referring Phys: CORDELLA SHAWL --------------------------------------------------------------------------------  Indications: Swelling, and left.  Limitations: Body habitus and wheelchair bound. Performing Technologist: Jerel Croak RVT  Examination Guidelines: A complete evaluation includes B-mode imaging, spectral Doppler, color Doppler, and power Doppler as needed of all accessible portions of each vessel. Bilateral testing is considered an integral part of a complete examination. Limited examinations for reoccurring indications may  be performed as noted. The reflux portion of the exam is performed with the patient in reverse Trendelenburg.  +-----+---------------+---------+-----------+----------+--------------+ RIGHTCompressibilityPhasicitySpontaneityPropertiesThrombus Aging +-----+---------------+---------+-----------+----------+--------------+ CFV  Full           Yes                                          +-----+---------------+---------+-----------+----------+--------------+ SFJ  Full           Yes                                          +-----+---------------+---------+-----------+----------+--------------+   +-------+---------------+---------+-----------+----------+--------------+ LEFT   CompressibilityPhasicitySpontaneityPropertiesThrombus Aging  +-------+---------------+---------+-----------+----------+--------------+ CFV    Full           Yes                                          +-------+---------------+---------+-----------+----------+--------------+ SFJ    Full           Yes                                          +-------+---------------+---------+-----------+----------+--------------+ FV Mid Full           Yes                                          +-------+---------------+---------+-----------+----------+--------------+ PFV    Full           Yes                                          +-------+---------------+---------+-----------+----------+--------------+ POP    Full           Yes                                          +-------+---------------+---------+-----------+----------+--------------+ PTV    Full           Yes                                          +-------+---------------+---------+-----------+----------+--------------+ GastrocFull           Yes                                          +-------+---------------+---------+-----------+----------+--------------+ GSV    Full           Yes                                          +-------+---------------+---------+-----------+----------+--------------+  Summary: RIGHT: - No evidence of common femoral vein obstruction.   LEFT: - There is no evidence of deep vein thrombosis in the lower extremity. However, portions of this examination were limited- see technologist comments above.  *See table(s) above for measurements and observations. Electronically signed by Cordella Shawl MD on 08/01/2024 at 9:24:20 AM.    Final     Microbiology: Results for orders placed or performed during the hospital encounter of 08/17/24  Resp panel by RT-PCR (RSV, Flu A&B, Covid) Anterior Nasal Swab     Status: None   Collection Time: 08/17/24  5:52 PM   Specimen: Anterior Nasal Swab  Result Value Ref Range Status   SARS Coronavirus 2 by RT PCR  NEGATIVE NEGATIVE Final    Comment: (NOTE) SARS-CoV-2 target nucleic acids are NOT DETECTED.  The SARS-CoV-2 RNA is generally detectable in upper respiratory specimens during the acute phase of infection. The lowest concentration of SARS-CoV-2 viral copies this assay can detect is 138 copies/mL. A negative result does not preclude SARS-Cov-2 infection and should not be used as the sole basis for treatment or other patient management decisions. A negative result may occur with  improper specimen collection/handling, submission of specimen other than nasopharyngeal swab, presence of viral mutation(s) within the areas targeted by this assay, and inadequate number of viral copies(<138 copies/mL). A negative result must be combined with clinical observations, patient history, and epidemiological information. The expected result is Negative.  Fact Sheet for Patients:  bloggercourse.com  Fact Sheet for Healthcare Providers:  seriousbroker.it  This test is no t yet approved or cleared by the [place]  FDA and  has been authorized for detection and/or diagnosis of SARS-CoV-2 by FDA under an Emergency Use Authorization (EUA). This EUA will remain  in effect (meaning this test can be used) for the duration of the COVID-19 declaration under Section 564(b)(1) of the Act, 21 U.S.C.section 360bbb-3(b)(1), unless the authorization is terminated  or revoked sooner.       Influenza A by PCR NEGATIVE NEGATIVE Final   Influenza B by PCR NEGATIVE NEGATIVE Final    Comment: (NOTE) The Xpert Xpress SARS-CoV-2/FLU/RSV plus assay is intended as an aid in the diagnosis of influenza from Nasopharyngeal swab specimens and should not be used as a sole basis for treatment. Nasal washings and aspirates are unacceptable for Xpert Xpress SARS-CoV-2/FLU/RSV testing.  Fact Sheet for Patients: bloggercourse.com  Fact Sheet for  Healthcare Providers: seriousbroker.it  This test is not yet approved or cleared by the [place]  FDA and has been authorized for detection and/or diagnosis of SARS-CoV-2 by FDA under an Emergency Use Authorization (EUA). This EUA will remain in effect (meaning this test can be used) for the duration of the COVID-19 declaration under Section 564(b)(1) of the Act, 21 U.S.C. section 360bbb-3(b)(1), unless the authorization is terminated or revoked.     Resp Syncytial Virus by PCR NEGATIVE NEGATIVE Final    Comment: (NOTE) Fact Sheet for Patients: bloggercourse.com  Fact Sheet for Healthcare Providers: seriousbroker.it  This test is not yet approved or cleared by the [place]  FDA and has been authorized for detection and/or diagnosis of SARS-CoV-2 by FDA under an Emergency Use Authorization (EUA). This EUA will remain in effect (meaning this test can be used) for the duration of the COVID-19 declaration under Section 564(b)(1) of the Act, 21 U.S.C. section 360bbb-3(b)(1), unless the authorization is terminated or revoked.  Performed at [facility], [address], [place], [place] [address]  Labs: CBC: Recent Labs  Lab 08/17/24 1647 08/18/24 0026 08/18/24 0525 08/18/24 1634 08/19/24 0623 08/20/24 1034 08/21/24 0528 08/22/24 0553 08/23/24 0550 08/23/24 1448 08/24/24 0456  WBC 7.0 7.3 7.1  --  8.0 9.1  --   --   --   --   --   NEUTROABS 4.2  --   --   --   --   --   --   --   --   --   --   HGB 9.8* 9.2* 8.7*   < > 7.8* 8.4* 8.7* 8.3* 7.7* 8.3* 8.1*  HCT 31.4* 29.5* 27.6*  --  24.8* 27.2*  --   --   --   --   --   MCV 101.9* 102.8* 102.2*  --  102.9* 99.6  --   --   --   --   --   PLT 135* 124* 117*  --  114* 121*  --   --   --   --   --    < > = values in this interval not displayed.   Basic Metabolic Panel: Recent Labs  Lab 08/17/24 1647 08/17/24 1915  08/20/24 1034 08/21/24 0528 08/22/24 0553 08/23/24 0550 08/24/24 0456  NA 141   < > 144 142 140 138 138  K 5.3*   < > 4.4 4.0 3.8 3.5 3.5  CL 106   < > 112* 110 108 107 105  CO2 26   < > 24 22 22 22 24   GLUCOSE 96   < > 103* 101* 84 83 83  BUN 39*   < > 30* 26* 22 18 16   CREATININE 2.31*   < > 1.84* 1.60* 1.44* 1.27* 1.22*  CALCIUM  8.8*   < > 8.3* 8.4* 8.4* 8.1* 7.9*  MG 2.9*  --   --   --   --   --   --    < > = values in this interval not displayed.   Liver Function Tests: Recent Labs  Lab 08/17/24 1647  AST 52*  ALT 27  ALKPHOS 89  BILITOT 0.6  PROT 6.7  ALBUMIN 3.3*   CBG: No results for input(s): GLUCAP in the last 168 hours.  Discharge time spent: 35 minutes.  Signed: Murvin Mana, MD [facility] 08/24/2024

## 2024-08-24 NOTE — TOC Progression Note (Addendum)
 Transition of Care [facility]) - Progression Note    Patient Details  Name: Becky Trevino MRN: [id number] Date of Birth: [birth year]  Transition of Care [facility]) CM/SW Contact  Dalia GORMAN Fuse, RN Phone Number: 08/24/2024, 9:13 AM  Clinical Narrative:    Plan to discharge to [facility] today. Nitchia to switch auth from [employer] to [employer].  9:23 AM: Approved for [facility] JluyPI::[id number] Dates: 12/10-12/12/25 Next Review Date: 08/26/2024   Expected Discharge Plan: Home w Home Health Services Barriers to Discharge: Continued Medical Work up               Expected Discharge Plan and Services     Post Acute Care Choice: Home Health, Durable Medical Equipment Living arrangements for the past 2 months: Single Family Home Expected Discharge Date: 08/23/24                                     Social Drivers of Health (SDOH) Interventions SDOH Screenings   Food Insecurity: Patient Declined (08/18/2024)  Housing: Patient Declined (08/18/2024)  Transportation Needs: Patient Declined (08/18/2024)  Utilities: Patient Declined (08/18/2024)  Alcohol Screen: Low Risk  (08/06/2023)  Depression (PHQ2-9): Low Risk  (07/21/2024)  Financial Resource Strain: Low Risk  (08/15/2024)   Received from [facility]  Physical Activity: Insufficiently Active (08/06/2023)  Social Connections: Patient Declined (08/18/2024)  Stress: No Stress Concern Present (08/06/2023)  Tobacco Use: Low Risk  (08/21/2024)  Health Literacy: Adequate Health Literacy (08/06/2023)    Readmission Risk Interventions     No data to display

## 2024-08-25 ENCOUNTER — Ambulatory Visit: Payer: Self-pay

## 2024-08-25 DIAGNOSIS — Z0000 Encounter for general adult medical examination without abnormal findings: Secondary | ICD-10-CM | POA: Diagnosis not present

## 2024-08-25 NOTE — Patient Instructions (Addendum)
 Ms. Bufano,  Thank you for taking the time for your Medicare Wellness Visit. I appreciate your continued commitment to your health goals. Please review the care plan we discussed, and feel free to reach out if I can assist you further.  Please note that Annual Wellness Visits do not include a physical exam. Some assessments may be limited, especially if the visit was conducted virtually. If needed, we may recommend an in-person follow-up with your provider.  Ongoing Care Seeing your primary care provider every 3 to 6 months helps us  monitor your health and provide consistent, personalized care.   Referrals If a referral was made during today's visit and you haven't received any updates within two weeks, please contact the referred provider directly to check on the status.  Recommended Screenings:  Health Maintenance  Topic Date Due   Eye exam for diabetics  06/14/2024   Hemoglobin A1C  01/02/2025   COVID-19 Vaccine (9 - 2025-26 season) 01/15/2025   Complete foot exam   02/11/2025   Yearly kidney health urinalysis for diabetes  03/04/2025   Yearly kidney function blood test for diabetes  08/24/2025   Medicare Annual Wellness Visit  08/25/2025   Osteoporosis screening with Bone Density Scan  10/15/2026   DTaP/Tdap/Td vaccine (3 - Td or Tdap) 11/06/2026   Pneumococcal Vaccine for age over 34  Completed   Flu Shot  Completed   Hepatitis C Screening  Completed   Zoster (Shingles) Vaccine  Completed   Meningitis B Vaccine  Aged Out   Breast Cancer Screening  Discontinued   Colon Cancer Screening  Discontinued   Cologuard (Stool DNA test)  Discontinued       08/25/2024   10:09 AM  Advanced Directives  Does Patient Have a Medical Advance Directive? Yes  Type of Estate Agent of [place];Living will  Does patient want to make changes to medical advance directive? No - Patient declined  Copy of Healthcare Power of Attorney in Chart? Yes - validated most recent  copy scanned in chart (See row information)  Would patient like information on creating a medical advance directive? No - Patient declined    Vision: Annual vision screenings are recommended for early detection of glaucoma, cataracts, and diabetic retinopathy. These exams can also reveal signs of chronic conditions such as diabetes and high blood pressure.  Dental: Annual dental screenings help detect early signs of oral cancer, gum disease, and other conditions linked to overall health, including heart disease and diabetes.  Please see the attached documents for additional preventive care recommendations.   NEXT AWV 08/31/25 @ 10:10 AM IN PERSON

## 2024-08-25 NOTE — Progress Notes (Signed)
 Chief Complaint  Patient presents with   Medicare Wellness     Subjective:   Becky Trevino is a 78 y.o. female who presents for a Medicare Annual Wellness Visit.  Functional Status Activities of Daily Living (to include ambulation/medication): Independent Ambulation: Independent with device- listed below Home Assistive Devices/Equipment: Other (Comment) Medication Administration: Independent Home Management (perform basic housework or laundry): Independent  Fall Screening Falls in the past year?: 1 Number of falls in past year: 0 Was there an injury with Fall?: 0 Fall Risk Category Calculator: 1 Patient Fall Risk Level: High fall risk  Fall Risk Patient at Risk for Falls Due to: Impaired balance/gait Fall risk Follow up: Falls evaluation completed  Advance Directives (For Healthcare) Does Patient Have a Medical Advance Directive?: No Would patient like information on creating a medical advance directive?: No - Patient declined    Allergies (verified) Lisinopril   Current Medications (verified) Outpatient Encounter Medications as of 08/25/2024  Medication Sig   ACCU-CHEK GUIDE test strip TEST ONCE A DAY AS DIRECTED   acetaminophen  (TYLENOL ) 325 MG tablet Take 2 tablets (650 mg total) by mouth every 6 (six) hours as needed for mild pain (pain score 1-3) or fever (or Fever >/= 101).   amiodarone  (PACERONE ) 200 MG tablet Take 200 mg by mouth daily.   amoxicillin  (AMOXIL ) 500 MG capsule Take 1 capsule (500 mg total) by mouth 2 (two) times daily.   apixaban  (ELIQUIS ) 5 MG TABS tablet Take 1 tablet by mouth 2 (two) times daily.   atorvastatin  (LIPITOR) 40 MG tablet TAKE 1 TABLET BY MOUTH EVERY DAY IN THE EVENING   blood glucose meter kit and supplies 1 each by Other route daily at 12 noon. Dispense based on patient and insurance preference. Check fsbs once a day   Calcium  Carbonate-Vitamin D  600-400 MG-UNIT tablet Take 1 tablet by mouth 2 (two) times daily.    dapagliflozin  propanediol (FARXIGA ) 10 MG TABS tablet Take 1 tablet (10 mg total) by mouth daily before breakfast. Patient receives via [employer] Patient Assistance   escitalopram  (LEXAPRO ) 5 MG tablet TAKE 1 TABLET (5 MG TOTAL) BY MOUTH DAILY.   ferrous sulfate  325 (65 FE) MG EC tablet Take 1 tablet (325 mg total) by mouth daily.   fluticasone  (FLONASE ) 50 MCG/ACT nasal spray SPRAY 2 SPRAYS INTO EACH NOSTRIL EVERY DAY   hydroquinone 4 % cream Apply topically at bedtime as needed.   latanoprost  (XALATAN ) 0.005 % ophthalmic solution Place 1 drop into both eyes at bedtime.   levothyroxine  (SYNTHROID ) 75 MCG tablet Take 1 tablet (75 mcg total) by mouth daily.   Magnesium  Oxide 500 MG CAPS Take 500 mg by mouth daily.    midodrine  (PROAMATINE ) 10 MG tablet Take 1 tablet (10 mg total) by mouth 3 (three) times daily with meals.   oxyCODONE  (OXY IR/ROXICODONE ) 5 MG immediate release tablet Take 1 tablet (5 mg total) by mouth every 6 (six) hours as needed for severe pain (pain score 7-10).   pantoprazole  (PROTONIX ) 40 MG tablet Take 1 tablet (40 mg total) by mouth 2 (two) times daily.   polyethylene glycol (MIRALAX  / GLYCOLAX ) 17 g packet Take 17 g by mouth every other day. (Patient taking differently: Take 17 g by mouth every other day. TAKES PRN)   potassium chloride  (KLOR-CON ) 10 MEQ tablet Take 10 mEq by mouth daily.   pregabalin  (LYRICA ) 50 MG capsule Take 1 capsule (50 mg total) by mouth every evening.   SIMBRINZA 1-0.2 % SUSP Apply  1 drop to eye 2 (two) times daily.   sodium chloride  (OCEAN) 0.65 % SOLN nasal spray Place 1 spray into both nostrils 3 (three) times daily.   torsemide  (DEMADEX ) 20 MG tablet TAKE 1 TABLET BY MOUTH EVERY DAY   No facility-administered encounter medications on file as of 08/25/2024.    History: Past Medical History:  Diagnosis Date   Cervical spondylosis    Chronic kidney disease    Stage 3   DDD (degenerative disc disease), cervical    [facility]   Diabetes  mellitus without complication (HCC)    Diverticulosis    Hyperlipidemia    Hypertension    Intertrigo    Leg cramps    Leg varices    Obesity    OSA (obstructive sleep apnea)    Symptomatic menopausal or female climacteric states    Syncope    Past Surgical History:  Procedure Laterality Date   ABDOMINAL HYSTERECTOMY  1993   Total   BUNIONECTOMY Bilateral 1993   ESOPHAGOGASTRODUODENOSCOPY (EGD) WITH PROPOFOL  N/A 05/13/2023   Procedure: ESOPHAGOGASTRODUODENOSCOPY (EGD) WITH PROPOFOL ;  Surgeon: Therisa Bi, MD;  Location: [facility] ENDOSCOPY;  Service: Gastroenterology;  Laterality: N/A;   I & D KNEE WITH POLY EXCHANGE Right 11/19/2017   Procedure: RIGHT KNEE POLY EXCHANGE WITH IRRIGATION AND DEBRIDEMENT;  Surgeon: Kathlynn Sharper, MD;  Location: [facility] ORS;  Service: Orthopedics;  Laterality: Right;   IR FLUORO GUIDED NEEDLE PLC ASPIRATION/INJECTION LOC  09/19/2021   JOINT REPLACEMENT     bilateral knee   LAMINECTOMY  11/14/2013    Cervical Fusion , [facility], Dr. Oma   LASER ABLATION Bilateral 07/29/2012   Dr. Marea   LOWER EXTREMITY ANGIOGRAPHY Right 11/02/2019   Procedure: Lower Extremity Angiography;  Surgeon: Jama Cordella MATSU, MD;  Location: [facility] INVASIVE CV LAB;  Service: Cardiovascular;  Laterality: Right;   ORIF ANKLE FRACTURE Right 09/02/2019   Procedure: OPEN REDUCTION INTERNAL FIXATION (ORIF) ANKLE FRACTURE BIMALLEOLAR;  Surgeon: Lennie Barter, DPM;  Location: [facility] ORS;  Service: Podiatry;  Laterality: Right;   PERIPHERAL VASCULAR THROMBECTOMY Right 03/02/2020   Procedure: PERIPHERAL VASCULAR THROMBECTOMY;  Surgeon: Marea Selinda RAMAN, MD;  Location: [facility] INVASIVE CV LAB;  Service: Cardiovascular;  Laterality: Right;   REPLACEMENT TOTAL KNEE Left 03/2010   Dr. Kathlynn   TEE WITHOUT CARDIOVERSION N/A 09/19/2021   Procedure: TRANSESOPHAGEAL ECHOCARDIOGRAM (TEE);  Surgeon: Hester Wolm PARAS, MD;  Location: [facility] ORS;  Service: Cardiovascular;  Laterality: N/A;   TOTAL HIP ARTHROPLASTY Left 12/11/2015    Procedure: TOTAL HIP ARTHROPLASTY ANTERIOR APPROACH;  Surgeon: Sharper Kathlynn, MD;  Location: [facility] ORS;  Service: Orthopedics;  Laterality: Left;   TOTAL KNEE ARTHROPLASTY Right 03/11/2016   Procedure: TOTAL KNEE ARTHROPLASTY;  Surgeon: Sharper Kathlynn, MD;  Location: [facility] ORS;  Service: Orthopedics;  Laterality: Right;   Family History  Problem Relation Age of Onset   Diabetes Mother    Hyperlipidemia Mother    Hypertension Mother    Diabetes Father    Hyperlipidemia Father    Hypertension Father    Obesity Father    Hypertension Sister    Hyperlipidemia Sister    Hyperlipidemia Sister    Breast cancer Neg Hx    Social History   Occupational History   Occupation: Retired  Tobacco Use   Smoking status: Never    Passive exposure: Never   Smokeless tobacco: Never  Vaping Use   Vaping status: Never Used  Substance and Sexual Activity   Alcohol use: No    Alcohol/week: 0.0  standard drinks of alcohol   Drug use: No   Sexual activity: Yes    Partners: Male   Tobacco Counseling Counseling given: Not Answered  SDOH Screenings   Food Insecurity: Patient Declined (08/18/2024)  Housing: Patient Declined (08/18/2024)  Transportation Needs: Patient Declined (08/18/2024)  Utilities: Patient Declined (08/18/2024)  Alcohol Screen: Low Risk (08/06/2023)  Depression (PHQ2-9): Low Risk (07/21/2024)  Financial Resource Strain: Low Risk  (08/15/2024)   Received from [facility]  Physical Activity: Insufficiently Active (08/06/2023)  Social Connections: Patient Declined (08/18/2024)  Stress: No Stress Concern Present (08/06/2023)  Tobacco Use: Low Risk (08/25/2024)  Health Literacy: Adequate Health Literacy (08/06/2023)   See flowsheets for full screening details  Depression Screen PHQ 2 & 9 Depression Scale- Over the past 2 weeks, how often have you been bothered by any of the following problems? Little interest or pleasure in doing things: 0 Feeling down, depressed, or  hopeless (PHQ Adolescent also includes...irritable): 0 PHQ-2 Total Score: 0 Trouble falling or staying asleep, or sleeping too much: 0 Feeling tired or having little energy: 0 Poor appetite or overeating (PHQ Adolescent also includes...weight loss): 0 Feeling bad about yourself - or that you are a failure or have let yourself or your family down: 0 Trouble concentrating on things, such as reading the newspaper or watching television (PHQ Adolescent also includes...like school work): 0 Moving or speaking so slowly that other people could have noticed. Or the opposite - being so fidgety or restless that you have been moving around a lot more than usual: 0 Thoughts that you would be better off dead, or of hurting yourself in some way: 0 PHQ-9 Total Score: 0 If you checked off any problems, how difficult have these problems made it for you to do your work, take care of things at home, or get along with other people?: Not difficult at all     Goals Addressed             This Visit's Progress    DIET - REDUCE SUGAR INTAKE               Objective:    There were no vitals filed for this visit. There is no height or weight on file to calculate BMI.  Hearing/Vision screen No results found. Immunizations and Health Maintenance Health Maintenance  Topic Date Due   OPHTHALMOLOGY EXAM  06/14/2024   HEMOGLOBIN A1C  01/02/2025   COVID-19 Vaccine (9 - 2025-26 season) 01/15/2025   FOOT EXAM  02/11/2025   Diabetic kidney evaluation - Urine ACR  03/04/2025   Diabetic kidney evaluation - eGFR measurement  08/24/2025   Medicare Annual Wellness (AWV)  08/25/2025   DTaP/Tdap/Td (3 - Td or Tdap) 11/06/2026   Pneumococcal Vaccine: 50+ Years  Completed   Influenza Vaccine  Completed   Bone Density Scan  Completed   Hepatitis C Screening  Completed   Zoster Vaccines- Shingrix  Completed   Meningococcal B Vaccine  Aged Out   Mammogram  Discontinued   Colonoscopy  Discontinued   Fecal DNA  (Cologuard)  Discontinued        Assessment/Plan:  This is a routine wellness examination for Madysyn.  Patient Care Team: Sowles, Krichna, MD as PCP - General (Family Medicine) Florencio Cara BIRCH, MD as Consulting Physician (Cardiology) Carolee Manus DASEN., MD as Consulting Physician (Ophthalmology) Kathlynn Sharper, MD as Consulting Physician (Orthopedic Surgery) Lennie Barter, DPM as Consulting Physician (Podiatry) Babara Call, MD as Consulting Physician (Oncology) Marea Mayo  S, MD as Referring Physician (Vascular Surgery) Alana Sharyle LABOR, RPH-[name] as Pharmacist  I have personally reviewed and noted the following in the patients chart:   Medical and social history Use of alcohol, tobacco or illicit drugs  Current medications and supplements including opioid prescriptions. Functional ability and status Nutritional status Physical activity Advanced directives List of other physicians Hospitalizations, surgeries, and ER visits in previous 12 months Vitals Screenings to include cognitive, depression, and falls Referrals and appointments  No orders of the defined types were placed in this encounter.  In addition, I have reviewed and discussed with patient certain preventive protocols, quality metrics, and best practice recommendations. A written personalized care plan for preventive services as well as general preventive health recommendations were provided to patient.   Jhonnie GORMAN Das, LPN   [id number]   Return in 1 year (on 08/25/2025).  After Visit Summary: (MyChart) Due to this being a telephonic visit, the after visit summary with patients personalized plan was offered to patient via MyChart   Nurse Notes:UTD ON SHOTS;  HAS MAMMOGRAM SCHEDULED ON 12/31; AGED OUT OF COLONOSCOPY; UTD ON BDS PT CURRENTLY IN HOSPITAL/REHAB

## 2024-09-07 ENCOUNTER — Other Ambulatory Visit: Payer: Self-pay | Admitting: Family Medicine

## 2024-09-07 DIAGNOSIS — I700 Atherosclerosis of aorta: Secondary | ICD-10-CM

## 2024-09-07 DIAGNOSIS — E785 Hyperlipidemia, unspecified: Secondary | ICD-10-CM

## 2024-09-07 DIAGNOSIS — R600 Localized edema: Secondary | ICD-10-CM

## 2024-09-07 DIAGNOSIS — F419 Anxiety disorder, unspecified: Secondary | ICD-10-CM

## 2024-09-08 ENCOUNTER — Other Ambulatory Visit: Payer: Self-pay | Admitting: Physician Assistant

## 2024-09-08 DIAGNOSIS — K219 Gastro-esophageal reflux disease without esophagitis: Secondary | ICD-10-CM

## 2024-09-09 NOTE — Telephone Encounter (Signed)
 Requested Prescriptions  Pending Prescriptions Disp Refills   atorvastatin  (LIPITOR) 40 MG tablet [Pharmacy Med Name: ATORVASTATIN  40 MG TABLET] 90 tablet 0    Sig: TAKE 1 TABLET BY MOUTH EVERY DAY IN THE EVENING     Cardiovascular:  Antilipid - Statins Failed - 09/09/2024  9:32 AM      Failed - Lipid Panel in normal range within the last 12 months    Cholesterol, Total  Date Value Ref Range Status  11/26/2015 166 100 - 199 mg/dL Final   Cholesterol  Date Value Ref Range Status  03/02/2024 163 <200 mg/dL Final   LDL Cholesterol (Calc)  Date Value Ref Range Status  03/02/2024 83 mg/dL (calc) Final    Comment:    Reference range: <100 . Desirable range <100 mg/dL for primary prevention;   <70 mg/dL for patients with CHD or diabetic patients  with > or = 2 CHD risk factors. [name] LDL-C is now calculated using the Martin-Hopkins  calculation, which is a validated novel method providing  better accuracy than the Friedewald equation in the  estimation of LDL-C.  Gladis APPLETHWAITE et al. SANDREA. 7986;689(80): 2061-2068  (http://education.[facility].com/faq/FAQ164)    HDL  Date Value Ref Range Status  03/02/2024 66 > OR = 50 mg/dL Final  96/86/7982 81 >60 mg/dL Final   Triglycerides  Date Value Ref Range Status  03/02/2024 55 <150 mg/dL Final         Passed - Patient is not pregnant      Passed - Valid encounter within last 12 months    Recent Outpatient Visits           1 month ago Bilateral edema of lower extremity   [facility] [place]ard Mire, MD   2 months ago Type 2 diabetes mellitus with diabetic neuropathy, without long-term current use of insulin  [facility])   [facility] Glenard Mire, MD   4 months ago Hypothyroidism (acquired)   [facility] Glenard Mire, MD   6 months ago Type 2 diabetes mellitus with diabetic neuropathy, without long-term current use of insulin  [facility])   [place]  [facility] Glenard Mire, MD   10 months ago AF (paroxysmal atrial fibrillation) [facility])   [facility] Glenard Mire, MD       Future Appointments             In 1 month [name], Clotilda LABOR, PA-C [facility] [place]   In 9 months Fayette Bodily, MD [facility]             torsemide  (DEMADEX ) 20 MG tablet [Pharmacy Med Name: TORSEMIDE  20 MG TABLET] 90 tablet 0    Sig: TAKE 1 TABLET BY MOUTH EVERY DAY     Cardiovascular:  Diuretics - Loop Failed - 09/09/2024  9:32 AM      Failed - Ca in normal range and within 180 days    Calcium   Date Value Ref Range Status  08/24/2024 7.9 (L) 8.9 - 10.3 mg/dL Final   Calcium , Total  Date Value Ref Range Status  10/18/2012 7.9 (L) 8.5 - 10.1 mg/dL Final   Calcium , Ion  Date Value Ref Range Status  09/02/2019 1.17 1.15 - 1.40 mmol/L Final         Failed - Cr in normal range and within 180 days    Creat  Date Value Ref Range Status  07/04/2024 1.30 (H) 0.60 - 1.00 mg/dL Final  Creatinine, Ser  Date Value Ref Range Status  08/24/2024 1.22 (H) 0.44 - 1.00 mg/dL Final   Creatinine, Urine  Date Value Ref Range Status  03/04/2024 75 20 - 275 mg/dL Final         Failed - Mg Level in normal range and within 180 days    Magnesium   Date Value Ref Range Status  08/17/2024 2.9 (H) 1.7 - 2.4 mg/dL Final    Comment:    Performed at [facility], [address] [place], [place] [address]         Passed - K in normal range and within 180 days    Potassium  Date Value Ref Range Status  08/24/2024 3.5 3.5 - 5.1 mmol/L Final  10/18/2012 3.5 3.5 - 5.1 mmol/L Final         Passed - Na in normal range and within 180 days    Sodium  Date Value Ref Range Status  08/24/2024 138 135 - 145 mmol/L Final  11/26/2015 142 134 - 144 mmol/L Final  10/18/2012 141 136 - 145 mmol/L Final         Passed - Cl in normal range and within 180 days    Chloride   Date Value Ref Range Status  08/24/2024 105 98 - 111 mmol/L Final  10/18/2012 110 (H) 98 - 107 mmol/L Final         Passed - Last BP in normal range    BP Readings from Last 1 Encounters:  08/24/24 (!) 106/51         Passed - Valid encounter within last 6 months    Recent Outpatient Visits           1 month ago Bilateral edema of lower extremity   [facility] Glenard Mire, MD   2 months ago Type 2 diabetes mellitus with diabetic neuropathy, without long-term current use of insulin  [facility])   [facility] Glenard Mire, MD   4 months ago Hypothyroidism (acquired)   [facility] Glenard Mire, MD   6 months ago Type 2 diabetes mellitus with diabetic neuropathy, without long-term current use of insulin  [facility])   [facility] [place], Mire, MD   10 months ago AF (paroxysmal atrial fibrillation) [facility])   [place] Glenard Mire, MD       Future Appointments             In 1 month [name], Clotilda DELENA RIGGERS [facility] [place]   In 9 months Fayette Bodily, MD [facility]             escitalopram  (LEXAPRO ) 5 MG tablet [Pharmacy Med Name: ESCITALOPRAM  5 MG TABLET] 90 tablet 0    Sig: TAKE 1 TABLET (5 MG TOTAL) BY MOUTH DAILY.     Psychiatry:  Antidepressants - SSRI Passed - 09/09/2024  9:32 AM      Passed - Valid encounter within last 6 months    Recent Outpatient Visits           1 month ago Bilateral edema of lower extremity   [facility] Glenard Mire, MD   2 months ago Type 2 diabetes mellitus with diabetic neuropathy, without long-term current use of insulin  [facility])   [facility] Glen[facility] Mire, MD   4 months ago Hypothyroidism (acquired)   [facility] Sow[place], Krichna, MD  6 months ago Type 2 diabetes mellitus  with diabetic neuropathy, without long-term current use of insulin  [facility])   [facility], Dorette, MD   10 months ago AF (paroxysmal atrial fibrillation) [facility])   [place] [facility] Sow[place], Krichna, MD       Future Appointments             In 1 month [name], Clotilda DELENA RIGGERS [facility] [place]   In 9 months Fayette Bodily, MD [facility]

## 2024-09-14 ENCOUNTER — Encounter

## 2024-09-23 ENCOUNTER — Other Ambulatory Visit: Payer: Self-pay | Admitting: Urology

## 2024-09-23 DIAGNOSIS — R351 Nocturia: Secondary | ICD-10-CM

## 2024-09-23 DIAGNOSIS — N3946 Mixed incontinence: Secondary | ICD-10-CM

## 2024-10-06 ENCOUNTER — Encounter

## 2024-10-09 ENCOUNTER — Other Ambulatory Visit: Payer: Self-pay | Admitting: Family Medicine

## 2024-10-09 DIAGNOSIS — E039 Hypothyroidism, unspecified: Secondary | ICD-10-CM

## 2024-10-10 NOTE — Telephone Encounter (Signed)
 Requested Prescriptions  Pending Prescriptions Disp Refills   levothyroxine  (SYNTHROID ) 75 MCG tablet [Pharmacy Med Name: LEVOTHYROXINE  75 MCG TABLET] 90 tablet 0    Sig: TAKE 1 TABLET BY MOUTH EVERY DAY     Endocrinology:  Hypothyroid Agents Failed - 10/10/2024  1:54 PM      Failed - TSH in normal range and within 360 days    TSH  Date Value Ref Range Status  08/18/2024 8.260 (H) 0.350 - 4.500 uIU/mL Final    Comment:    Performed at [facility], [address] [place], [place] [address]  07/04/2024 4.27 0.40 - 4.50 mIU/L Final         Passed - Valid encounter within last 12 months    Recent Outpatient Visits           2 months ago Bilateral edema of lower extremity   [place] [facility] Glenard Mire, MD   3 months ago Type 2 diabetes mellitus with diabetic neuropathy, without long-term current use of insulin  [facility])   [facility] Glenard Mire, MD   5 months ago Hypothyroidism (acquired)   [place] [facility] Glenard Mire, MD   7 months ago Type 2 diabetes mellitus with diabetic neuropathy, without long-term current use of insulin  [facility])   [place] [facility], Mire, MD   11 months ago AF (paroxysmal atrial fibrillation) [facility])   [place] [facility] Glenard Mire, MD       Future Appointments             In 1 week [name], Clotilda DELENA RIGGERS [facility] [place]   In 8 months Fayette Bodily, MD [facility] Center

## 2024-10-20 ENCOUNTER — Ambulatory Visit: Admitting: Urology

## 2024-11-09 ENCOUNTER — Ambulatory Visit: Admitting: Family Medicine

## 2024-12-02 ENCOUNTER — Ambulatory Visit (INDEPENDENT_AMBULATORY_CARE_PROVIDER_SITE_OTHER): Admitting: Nurse Practitioner

## 2024-12-02 ENCOUNTER — Encounter (INDEPENDENT_AMBULATORY_CARE_PROVIDER_SITE_OTHER)

## 2024-12-28 ENCOUNTER — Inpatient Hospital Stay

## 2025-01-04 ENCOUNTER — Inpatient Hospital Stay: Admitting: Oncology

## 2025-01-04 ENCOUNTER — Inpatient Hospital Stay

## 2025-06-22 ENCOUNTER — Ambulatory Visit: Admitting: Infectious Diseases

## 2025-08-31 ENCOUNTER — Ambulatory Visit
